# Patient Record
Sex: Male | Born: 1960 | Race: Black or African American | Hispanic: No | Marital: Single | Smoking: Current every day smoker
Health system: Southern US, Community
[De-identification: ages and names within clinical notes are randomized; demographics above are authoritative.]

## PROBLEM LIST (undated history)

## (undated) DIAGNOSIS — Z7189 Other specified counseling: Secondary | ICD-10-CM

## (undated) DIAGNOSIS — M5126 Other intervertebral disc displacement, lumbar region: Secondary | ICD-10-CM

## (undated) DIAGNOSIS — M549 Dorsalgia, unspecified: Secondary | ICD-10-CM

## (undated) DIAGNOSIS — E785 Hyperlipidemia, unspecified: Secondary | ICD-10-CM

## (undated) DIAGNOSIS — E119 Type 2 diabetes mellitus without complications: Secondary | ICD-10-CM

## (undated) DIAGNOSIS — I10 Essential (primary) hypertension: Secondary | ICD-10-CM

## (undated) DIAGNOSIS — IMO0001 Reserved for inherently not codable concepts without codable children: Secondary | ICD-10-CM

## (undated) DIAGNOSIS — Z794 Long term (current) use of insulin: Secondary | ICD-10-CM

## (undated) DIAGNOSIS — G8929 Other chronic pain: Secondary | ICD-10-CM

## (undated) DIAGNOSIS — I420 Dilated cardiomyopathy: Secondary | ICD-10-CM

## (undated) HISTORY — PX: BACK SURGERY: SHX140

## (undated) HISTORY — PX: APPENDECTOMY: SHX54

## (undated) HISTORY — PX: ABDOMINAL SURGERY: SHX537

## (undated) HISTORY — DX: Dilated cardiomyopathy: I42.0

## (undated) SURGERY — Surgical Case
Anesthesia: *Unknown

---

## 2000-06-08 ENCOUNTER — Emergency Department (HOSPITAL_COMMUNITY): Admission: EM | Admit: 2000-06-08 | Discharge: 2000-06-08 | Payer: Self-pay | Admitting: Emergency Medicine

## 2000-12-12 ENCOUNTER — Encounter: Payer: Self-pay | Admitting: Emergency Medicine

## 2000-12-12 ENCOUNTER — Emergency Department (HOSPITAL_COMMUNITY): Admission: EM | Admit: 2000-12-12 | Discharge: 2000-12-12 | Payer: Self-pay | Admitting: Emergency Medicine

## 2001-05-26 ENCOUNTER — Emergency Department (HOSPITAL_COMMUNITY): Admission: EM | Admit: 2001-05-26 | Discharge: 2001-05-26 | Payer: Self-pay | Admitting: Emergency Medicine

## 2001-05-27 ENCOUNTER — Emergency Department (HOSPITAL_COMMUNITY): Admission: EM | Admit: 2001-05-27 | Discharge: 2001-05-27 | Payer: Self-pay | Admitting: Emergency Medicine

## 2001-05-27 ENCOUNTER — Encounter: Payer: Self-pay | Admitting: Emergency Medicine

## 2001-05-29 ENCOUNTER — Emergency Department (HOSPITAL_COMMUNITY): Admission: EM | Admit: 2001-05-29 | Discharge: 2001-05-29 | Payer: Self-pay | Admitting: Emergency Medicine

## 2001-05-29 ENCOUNTER — Encounter: Payer: Self-pay | Admitting: Emergency Medicine

## 2001-09-13 ENCOUNTER — Emergency Department (HOSPITAL_COMMUNITY): Admission: EM | Admit: 2001-09-13 | Discharge: 2001-09-13 | Payer: Self-pay | Admitting: Emergency Medicine

## 2002-08-17 ENCOUNTER — Emergency Department (HOSPITAL_COMMUNITY): Admission: EM | Admit: 2002-08-17 | Discharge: 2002-08-17 | Payer: Self-pay | Admitting: Emergency Medicine

## 2002-08-17 ENCOUNTER — Encounter: Payer: Self-pay | Admitting: Emergency Medicine

## 2002-10-16 ENCOUNTER — Emergency Department (HOSPITAL_COMMUNITY): Admission: EM | Admit: 2002-10-16 | Discharge: 2002-10-16 | Payer: Self-pay

## 2002-11-18 ENCOUNTER — Encounter: Payer: Self-pay | Admitting: Internal Medicine

## 2002-11-18 ENCOUNTER — Ambulatory Visit (HOSPITAL_COMMUNITY): Admission: RE | Admit: 2002-11-18 | Discharge: 2002-11-18 | Payer: Self-pay | Admitting: Internal Medicine

## 2003-12-28 ENCOUNTER — Inpatient Hospital Stay (HOSPITAL_COMMUNITY): Admission: EM | Admit: 2003-12-28 | Discharge: 2004-01-04 | Payer: Self-pay | Admitting: Emergency Medicine

## 2003-12-28 ENCOUNTER — Encounter (INDEPENDENT_AMBULATORY_CARE_PROVIDER_SITE_OTHER): Payer: Self-pay | Admitting: *Deleted

## 2005-05-21 ENCOUNTER — Emergency Department (HOSPITAL_COMMUNITY): Admission: EM | Admit: 2005-05-21 | Discharge: 2005-05-21 | Payer: Self-pay | Admitting: *Deleted

## 2009-01-05 ENCOUNTER — Emergency Department (HOSPITAL_COMMUNITY): Admission: EM | Admit: 2009-01-05 | Discharge: 2009-01-05 | Payer: Self-pay | Admitting: Emergency Medicine

## 2011-02-07 NOTE — Consult Note (Signed)
[place]  Patient:    Ryan Medina, Ryan Medina Visit Number: [id number] MRN: [id number]          Service Type: EMS Location: [place] Attending Physician:  Lorre Nick Dictated by:   Gloris Manchester. Lazarus Salines, M.D. Proc. Date: 05/29/01 Admit Date:  05/29/2001 Discharge Date: 05/29/2001   CC:         Lorre Nick, M.D.   Consultation Report  CHIEF COMPLAINT:  Right-sided sore throat.  HISTORY OF PRESENT ILLNESS:  A 50 year old black male had an episode of what sounds like tonsillitis earlier this summer.  Beginning four days ago, he again developed a sore throat which was present for approximately 24 hours. It seemed to slack off and then come on more forcefully and localized to the right side.  No actual trismus.  Swallowing is tender but no breathing difficulty.  No documented fever.  He is not diabetic, HIV positive, or otherwise immune compromised.  Outside of the episode earlier this summer, he has not had tonsillitis to his recollection nor strep throat.  He does not carry a history of tonsillar hypertrophy.  He has a little bit of ear pain, a little bit of tenderness in his upper neck at the present time.  He was evaluated here in the emergency room today with negative findings on a lateral soft tissue neck, Vicodin for discomfort, and no antibiotics given.  He comes back in two days later.  On evaluation with the emergency room physician, CT scan showed a small, round, lucent area in the inferior aspect of the right tonsil consistent with a small abscess.  PHYSICAL EXAMINATION:  GENERAL:  This is a stocky adult black male in slight distress.  HEENT:  He does not have a hot potato voice.  He does have strong fetor oris. No visible neck swelling.  He is mildly tender in the right jugulodigastric region without discrete adenopathy.  He is breathing comfortably.  Both ear canals are clear with aerated drums and normal configuration.  Cranial  nerves intact.  Anterior nose clear.  Oral cavity most with teeth in good repair.  He has a very bulky, muscular tongue.  He has a slightly long soft palate and fleshy pharynx.  He has embedded tonsils with visible cryptic debris on the left side.  There was slight increased erythema and swelling of right tonsil but no  deviation of the uvula, no soft palate edema, and no active drainage. I did not examine nasopharynx or hypopharynx.  IMPRESSION:  Tonsillitis with probable peritonsillar cellulitis and possible small abscess.  Given the new use of CT scan as a diagnostic modality, it is not clear if small abscesses like this may correspond with cases where clinically abscess was in evidence and responded to medical management.  PLAN:  I have discussed this with him.  I think, since he has had no therapy thus far, I would like to try him on antibiotics.  Will have him on penicillin VK 1 g q.i.d. for 2 days followed by 500 mg q.i.d. for the remainder of 10 days.  I gave him some additional Vicodin for discomfort.  I explained to him that if there is a small abscess there, it may get worse such that we will have to see him once again.  Given the location of the tonsil and the configuration of his tongue, I think he would require intraoperative drainage if necessary.  We will hope to avoid this.  If the patient is doing nicely,  I will see him back in six weeks to make sure that tonsillar hypertrophy has settled down and everything else is clear.  If he is having additional problems, I will be glad to see him in the immediate future.  He understands and agrees. Dictated by:   Gloris Manchester. Lazarus Salines, M.D. Attending Physician:  Lorre Nick DD:  05/29/01 TD:  05/30/01 Job: [id number]

## 2011-02-07 NOTE — Consult Note (Signed)
NAME:  Ryan Medina, Ryan Medina                           ACCOUNT NO.:  [id number]   MEDICAL RECORD NO.:  [id number]                   PATIENT TYPE:  INP   LOCATION:  2106                                 FACILITY:  [facility]   PHYSICIAN:  Velora Heckler, M.D.                DATE OF BIRTH:  1961-08-07   DATE OF CONSULTATION:  12/28/2003  DATE OF DISCHARGE:                                   CONSULTATION   REFERRING PHYSICIANS:  1. Dante Gang, M.D.  2. Madaline Guthrie, M.D.   REASON FOR CONSULTATION:  Abdominal pain, gastrointestinal bleeding.   HISTORY OF PRESENT ILLNESS:  The patient is a 50 year old black male who  presents to the emergency department of [facility] by EMS  with 24-hour history of abdominal pain and fever.  The patient notes onset  of diffuse abdominal pain on December 27, 2003.  He was transported by EMS to  the emergency department.  The patient was seen by medical teaching service.  He was noted to have an elevated white blood cell count of 16,000.  Hemoglobin was low at 10.1.   Physical exam demonstrated abdominal tenderness.  General surgery is  consulted for evaluation.   The patient notes relatively sudden onset of abdominal pain.  He notes no  precipitating events.  He has had no prior episodes of pain of this type.  He notes normal bowel function.  He denies diarrhea or constipation.  He  denies  nausea or vomiting.  He does note some sweats.   PAST MEDICAL HISTORY:  1. Status post lumbar surgery by Dr. Ronnell Guadalajara in the past.  2. History of alcohol abuse.   MEDICATIONS:  None.   ALLERGIES:  None known.   SOCIAL HISTORY:  The patient lives in [place].  He lives with his mother.  He drinks approximately a fifth of liquor daily.  He smokes one to two packs  of cigarettes per week.   FAMILY HISTORY:  Noncontributory.   REVIEW OF SYMPTOMS:  A 15 system reviewed and discussed with the patient  without significant other findings except as  noted above.   PHYSICAL EXAMINATION:  GENERAL:  A 50 year old well-developed, well-  nourished black male in the emergency department room 9.  VITAL SIGNS:  Temperature 101.1, blood pressure 120/72, pulse 95,  respirations 36.  O2 saturation 98% on room air.  HEENT:  Normocephalic and atraumatic.  Sclerae are slightly icteric.  Pupils  are equal and round.  Dentition is good.  Mucus membranes are moist.  Voice  is normal.  NECK:  Anterior examination of the neck shows it to be symmetric.  Thyroid  is normal without nodularity.  There is no anterior or posterior cervical  lymphadenopathy and no supraclavicular masses.  LUNGS:  Clear to auscultation bilaterally without rales, rhonchi, or  wheezes.  CARDIOVASCULAR:  A mild tachycardia without murmur.  ABDOMEN:  Firm,  tense but without distention.  There are bowel sounds on  auscultation. There is diffuse abdominal tenderness to palpation and  percussion.  There is no palpable mass.  There is prominent voluntary  guarding.  There are no surgical wounds.  There is no sign of hernia.  EXTREMITIES:  Nontender without edema.  NEUROLOGICAL:  The patient is rousable to voice, although somewhat  somnolent.  No focal neurological deficits are identified.   LABORATORY DATA:  Abdominal x-ray shows findings of constipation. There is  no free air.  There is no sign of obstruction.  CT scan of the abdomen is  pending.   White count 16,000, hemoglobin 10.1, platelet count 384,000.  Electrolytes  are normal.  Liver function tests are normal.  Lipase is normal.  Coagulation studies are normal with an INR of 1.2.   IMPRESSION:  A 50 year old black male with history of alcohol abuse and now  sudden onset of abdominal pain.   PLAN:  1. Agree with plans for CT scan of abdomen now.  2. Agree with plans for gastroenterology consultation.  3. Will follow closely with you.                                               Velora Heckler, M.D.    TMG/MEDQ   D:  12/28/2003  T:  12/28/2003  Job:  [id number]   cc:   Madaline Guthrie, M.D.  [address]  [place] [address]  Fax: [phone]   Dante Gang, M.D.  Int. Med - Resident - [facility]  [place]  [place] [address]  Fax: [phone]

## 2011-02-07 NOTE — Discharge Summary (Signed)
NAME:  Ryan Medina, Ryan Medina                           ACCOUNT NO.:  [id number]   MEDICAL RECORD NO.:  [id number]                   PATIENT TYPE:  INP   LOCATION:  5736                                 FACILITY:  [facility]   PHYSICIAN:  Madaline Guthrie, M.D.                 DATE OF BIRTH:  October 21, 1960   DATE OF ADMISSION:  12/28/2003  DATE OF DISCHARGE:  01/04/2004                                 DISCHARGE SUMMARY   DISCHARGE DIAGNOSES:  1. 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  2. Status post aspiration event during induction of anesthesia.  3. History of ETOH abuse.   DISCHARGE MEDICATIONS:  Vicodin, per Dr. Gerrit Friends, p.r.n. pain.   DISCHARGE INSTRUCTIONS:  1. No strenuous activity.  2. No lifting of greater than 10 pounds for two weeks.  3. Soft diet.  4. The patient may shower.  5. May allow Steri-Strips to wear off on their own.  6. Call Dr. Ardine Eng office for temperature greater than 101, for redness,     drainage, or swelling at the incision site.   FOLLOWUP APPOINTMENT:  Dr. Gerrit Friends, call [phone] for appointment within the  next two weeks.   PROCEDURES PERFORMED:  Patient underwent a exploratory laparotomy and  closure of a pyloric channel ulcer with a Cheree Ditto patch on December 28, 2003.  This was performed by Dr. Velora Heckler.   CONSULTATIONS:  Velora Heckler, M.D., general surgery.   BRIEF ADMISSION HISTORY AND PHYSICAL:  1. This is a 50 year old male with a history of heavy alcohol use who     reported to the ED with a one day history of abdominal pain of sudden     onset.  CT scan showed probable duodenal perforation with extra     extravasation of contrast media.  The patient was taken to the OR and     underwent an exploratory lap with closure of the pyloric channel ulcer     with Cheree Ditto patch as noted above.  During the induction of anesthesia,     the patient also experienced an aspiration event.  The patient was placed     on IV antibiotics including  Zosyn, amoxicillin, IV Protonix.  The patient     was initially maintained in the ICU, after surgery and then later     transferred to stepdown unit prior to going to the floor.  The patient     was examined daily and evaluated for bowel sounds.  He did develop a     postoperative ileus.  When it stabilized, the patient was started on a     liquid diet and the diet was advanced as tolerated to solids.  The     patient was discharged tolerating a solid diet and stable.  On discharge,     the patient's hemoglobin was 8.4.  2. Aspiration event.  During induction of anesthesia, the patient was  maintained on the ventilator post-op, serial chest x-rays were taken.     The patient was extubated, on December 29, 2003, and tolerated extubation     well.  He experienced no other respiratory complications, and his     respiratory status was stable upon discharge.  3. ETOH abuse.  The patient was started on thiamine and folate.  The patient     was given counseling regarding     ETOH abuse and how it related to his admission to the hospital.  The     patient was monitored for alcohol withdrawal.  He suffered no signs or     symptoms of withdrawal during his stay and was discharged home in stable     condition.      Donald Pore, MD                          Madaline Guthrie, M.D.    HP/MEDQ  D:  03/14/2004  T:  03/16/2004  Job:  [id number]   cc:   Velora Heckler, M.D.  [address] [place]  [place] [address]  Fax: [phone]

## 2011-02-07 NOTE — Op Note (Signed)
NAME:  Ryan Medina, Ryan Medina                           ACCOUNT NO.:  [id number]   MEDICAL RECORD NO.:  [id number]                   PATIENT TYPE:  INP   LOCATION:  2106                                 FACILITY:  [facility]   PHYSICIAN:  Velora Heckler, M.D.                DATE OF BIRTH:  1960-11-24   DATE OF PROCEDURE:  12/28/2003  DATE OF DISCHARGE:                                 OPERATIVE REPORT   PREOPERATIVE DIAGNOSIS:  Perforated ulcer.   POSTOPERATIVE DIAGNOSIS:  Perforated pyloric channel ulcer with diffuse  peritonitis.   PROCEDURE:  1. Exploratory laparotomy.  2. Closure pyloric channel ulcer with Cheree Ditto patch.   SURGEON:  Velora Heckler, M.D.   ASSISTANT:  Ollen Gross. Carolynne Edouard, M.D.   ANESTHESIA:  General per Dr. Edrick Oh BLOOD LOSS:  Minimal.   PREPARATION:  Betadine.   COMPLICATIONS:  Aspiration event with induction of anesthesia.   INDICATIONS FOR PROCEDURE:  The patient is a 50 year old black male who  presents to the emergency room  with a 24 hour history of abdominal pain.  The patient was seen by the medical teaching service.  General surgery was  consulted.  CT scan was obtained which showed perforated duodenal ulcer with  free air and peritoneal fluid.  The patient was brought urgently to the  operating room for exploration.   DESCRIPTION OF PROCEDURE:  The procedure was done in OR 16 at [facility].  The patient was brought to the operating room and placed  in a supine position on the operating table.  During induction of  anesthesia, the patient had an aspiration event with emesis of gastric  contents.  The patient was successfully intubated and suctioned.  Bilious  colored fluid was noted coming from the endotracheal tube during the  procedure.  This is recorded in the anesthesia notes.  After assuring that  adequate airway and induction of anesthesia had been achieved, the patient  was prepped and draped in the usual strict aseptic  fashion.  After  ascertaining that an adequate level of anesthesia had been obtained, a  midline abdominal incision was made with a #10 blade.  Dissection was  carried down through the subcutaneous tissues.  The fascia was incised in  the midline and the peritoneal cavity was entered cautiously.  2.5 liters of  bilious stained fluid was aspirated from the peritoneal cavity.  There were  signs of diffuse peritonitis with a fibrinous exudate on the bowel wall and  peritoneal surfaces.  Exploration reveals an approximately 8 mm round  perforation in the pyloric channel anteriorly.  Bilious fluid is coming from  the opening.  The defect is closed using interrupted 2-0 silk sutures with a  segment of omentum forming a Graham patch.  This provides excellent closure  of the defect.  The abdomen is then copiously irrigated with several liters  of  warm saline which is evacuated.  Nasogastric tube is properly positioned  within the stomach.  The midline wound is closed with  interrupted #1 Novafil sutures.  The subcutaneous tissues were irrigated and  the skin was closed with stainless steel staples.  Sterile dressings were  applied.  The patient was transported from the operating room to the  intensive care unit intubated and sedated.                                               Velora Heckler, M.D.    TMG/MEDQ  D:  12/28/2003  T:  12/28/2003  Job:  [id number]   cc:   Madaline Guthrie, M.D.  [address]  [place] [address]  Fax: [phone]   Charlaine Dalton. Sherene Sires, M.D. [facility] -

## 2011-10-31 ENCOUNTER — Ambulatory Visit (HOSPITAL_COMMUNITY)
Admission: RE | Admit: 2011-10-31 | Discharge: 2011-10-31 | Disposition: A | Discharge status: Home / Self Care | Payer: Self-pay | Source: Ambulatory Visit | Attending: Emergency Medicine | Admitting: Emergency Medicine

## 2011-10-31 ENCOUNTER — Other Ambulatory Visit (HOSPITAL_COMMUNITY): Payer: Self-pay

## 2011-10-31 ENCOUNTER — Encounter (HOSPITAL_COMMUNITY): Payer: Self-pay

## 2011-10-31 ENCOUNTER — Emergency Department (INDEPENDENT_AMBULATORY_CARE_PROVIDER_SITE_OTHER)
Admission: EM | Admit: 2011-10-31 | Discharge: 2011-10-31 | Disposition: A | Discharge status: Home / Self Care | Payer: Self-pay | Source: Home / Self Care | Attending: Emergency Medicine | Admitting: Emergency Medicine

## 2011-10-31 ENCOUNTER — Ambulatory Visit (HOSPITAL_COMMUNITY): Admit: 2011-10-31 | Payer: Self-pay

## 2011-10-31 DIAGNOSIS — M79609 Pain in unspecified limb: Secondary | ICD-10-CM

## 2011-10-31 DIAGNOSIS — R52 Pain, unspecified: Secondary | ICD-10-CM

## 2011-10-31 DIAGNOSIS — M7989 Other specified soft tissue disorders: Secondary | ICD-10-CM

## 2011-10-31 DIAGNOSIS — I809 Phlebitis and thrombophlebitis of unspecified site: Secondary | ICD-10-CM

## 2011-10-31 DIAGNOSIS — R609 Edema, unspecified: Secondary | ICD-10-CM

## 2011-10-31 HISTORY — DX: Essential (primary) hypertension: I10

## 2011-10-31 HISTORY — DX: Other intervertebral disc displacement, lumbar region: M51.26

## 2011-10-31 MED ORDER — IBUPROFEN 800 MG PO TABS: 800.0000 mg | ORAL_TABLET | Freq: Three times a day (TID) | ORAL | Status: AC | PRN

## 2011-10-31 NOTE — ED Notes (Addendum)
C/o pain and swelling in lower left leg for 2 week; leg tight, warm to touch , [facility] nurse told him to come get checked . Pt denies injury

## 2011-10-31 NOTE — ED Provider Notes (Addendum)
History     CSN: [id number]  Arrival date & time 10/31/11  0913   First MD Initiated Contact with Patient 10/31/11 1004      Chief Complaint  Patient presents with  . Leg Pain    (Consider location/radiation/quality/duration/timing/severity/associated sxs/prior treatment) HPI Comments: Patient reports acute medial left lower extremity pain, redness, swelling starting 2 weeks ago. Does not recall any recent trauma to the area. Has some medial redness, tenderness, along great saphenous vein. No rash, bruising. Patient denies chest pain, shortness of breath, tachycardia, presyncope, syncope. No history of cancer, prolonged immobilization, surgery in the past 4 weeks, history of DVT or PE, exogenous hormones. No history of hypercoagulability. Patient hasn't tried anything for this.  ROS as noted in HPI. All other ROS negative.   Patient is a 51 y.o. male presenting with leg pain. The history is provided by the patient. No language interpreter was used.  Leg Pain  The incident occurred at home. There was no injury mechanism. The pain is present in the left leg. The pain has been constant since onset. Pertinent negatives include no numbness, no inability to bear weight, no loss of motion, no muscle weakness, no loss of sensation and no tingling. He has tried nothing for the symptoms.    Past Medical History  Diagnosis Date  . Hypertension   . Ruptured lumbar disc     Past Surgical History  Procedure Date  . Back surgery   . Abdominal surgery     History reviewed. No pertinent family history.  History  Substance Use Topics  . Smoking status: Current Everyday Smoker  . Smokeless tobacco: Not on file  . Alcohol Use: No      Review of Systems  Neurological: Negative for tingling and numbness.    Allergies  Review of patient's allergies indicates no known allergies.  Home Medications   Current Outpatient Rx  Name Route Sig Dispense Refill  . HYDROCHLOROTHIAZIDE 25 MG PO  TABS Oral Take 25 mg by mouth daily.      BP 147/96  Pulse 68  Temp(Src) 98.5 F (36.9 C) (Oral)  SpO2 100%  Physical Exam  Nursing note and vitals reviewed. Constitutional: He is oriented to person, place, and time. He appears well-developed and well-nourished.  HENT:  Head: Normocephalic and atraumatic.  Eyes: Conjunctivae and EOM are normal.  Neck: Normal range of motion.  Cardiovascular: Regular rhythm.   Pulmonary/Chest: Effort normal. No respiratory distress.  Abdominal: He exhibits no distension.  Musculoskeletal: Normal range of motion.       Legs:      Tenderness, erythema, palpable cord along great saphenous vein of left lower extremity. Negative Homans sign. No palpable cord behind half. 1+ edema distally. DP 2+. Sensation intact distal to swelling. Patient able to move all toes actively. No tenderness along medial upper thigh. Foot, ankle, knee joint within normal limits  Neurological: He is alert and oriented to person, place, and time.  Skin: Skin is warm and dry.  Psychiatric: He has a normal mood and affect. His behavior is normal.    ED Course  Procedures (including critical care time)  Labs Reviewed - No data to display No results found.   1. Acute superficial venous thrombosis of lower extremity     duplex results with venous tech. No evidence of DVT bilaterarlly. No SVT LLE. (+) varicose veins LLE.   MDM  No previous records are available. Patient with apparent superficial venous thrombosis left lower extremity. Patient satting  100% on room air, is not tachycardic. Denies any chest pain, or shortness of breath. I have arranged a venous duplex ultrasound of both lower extremities today at 2:30. Patient understands that he will go and register 145, and have the study done. If he has a DVT, he will go to the ER. If he has a superficial venous thrombosis, he will come back up here. Discussed this with patient extensively. Patient agrees with plan.  Luiz Blare, MD 10/31/11 1110  Pt with no SVT, DVT. Will tx as superficial thorombophlebitis with TED hose, warm soaks, and NSAIDs. Pt returned to [facility]- RN discussed results and plan with pt. Will have him return in 1 week to 10 days for re-evaluation.   Luiz Blare, MD 10/31/11 2156

## 2011-10-31 NOTE — Progress Notes (Signed)
*  PRELIMINARY RESULTS* Vascular Ultrasound Lower Extremity Venous Duplex has been completed.  Preliminary findings: Bilateral:  No evidence of DVT, superficial thrombosis, or Baker's Cyst.    Farrel Demark RDMS 10/31/2011, 3:31 PM

## 2013-11-27 ENCOUNTER — Emergency Department (HOSPITAL_COMMUNITY)
Admission: EM | Admit: 2013-11-27 | Discharge: 2013-11-27 | Disposition: A | Discharge status: Home / Self Care | Payer: No Typology Code available for payment source | Attending: Emergency Medicine | Admitting: Emergency Medicine

## 2013-11-27 ENCOUNTER — Encounter (HOSPITAL_COMMUNITY): Payer: Self-pay | Admitting: Emergency Medicine

## 2013-11-27 DIAGNOSIS — T464X5A Adverse effect of angiotensin-converting-enzyme inhibitors, initial encounter: Secondary | ICD-10-CM

## 2013-11-27 DIAGNOSIS — Z79899 Other long term (current) drug therapy: Secondary | ICD-10-CM | POA: Insufficient documentation

## 2013-11-27 DIAGNOSIS — I10 Essential (primary) hypertension: Secondary | ICD-10-CM | POA: Insufficient documentation

## 2013-11-27 DIAGNOSIS — T4995XA Adverse effect of unspecified topical agent, initial encounter: Secondary | ICD-10-CM | POA: Insufficient documentation

## 2013-11-27 DIAGNOSIS — F17200 Nicotine dependence, unspecified, uncomplicated: Secondary | ICD-10-CM | POA: Insufficient documentation

## 2013-11-27 DIAGNOSIS — Z8739 Personal history of other diseases of the musculoskeletal system and connective tissue: Secondary | ICD-10-CM | POA: Insufficient documentation

## 2013-11-27 DIAGNOSIS — T783XXA Angioneurotic edema, initial encounter: Secondary | ICD-10-CM | POA: Insufficient documentation

## 2013-11-27 MED ORDER — METHYLPREDNISOLONE SODIUM SUCC 125 MG IJ SOLR
125.0000 mg | Freq: Once | INTRAMUSCULAR | Status: AC
  Administered 2013-11-27: 125 mg via INTRAVENOUS
  Filled 2013-11-27: qty 2

## 2013-11-27 MED ORDER — PREDNISONE 20 MG PO TABS: 40.0000 mg | ORAL_TABLET | Freq: Every day | ORAL | Status: DC

## 2013-11-27 MED ORDER — FAMOTIDINE IN NACL 20-0.9 MG/50ML-% IV SOLN
20.0000 mg | Freq: Once | INTRAVENOUS | Status: AC
  Administered 2013-11-27: 20 mg via INTRAVENOUS
  Filled 2013-11-27: qty 50

## 2013-11-27 MED ORDER — DIPHENHYDRAMINE HCL 50 MG/ML IJ SOLN
25.0000 mg | Freq: Once | INTRAMUSCULAR | Status: AC
  Administered 2013-11-27: 25 mg via INTRAVENOUS
  Filled 2013-11-27: qty 1

## 2013-11-27 NOTE — ED Provider Notes (Signed)
Medical screening examination/treatment/procedure(s) were performed by non-physician practitioner and as supervising physician I was immediately available for consultation/collaboration.   EKG Interpretation None        Sam Overbeck S Kharon Hixon, MD 11/27/13 2129 

## 2013-11-27 NOTE — ED Notes (Signed)
PT reports RT side of face was swollen when he woke up this AM. No dental pain . No rashes observed .

## 2013-11-27 NOTE — ED Provider Notes (Signed)
CSN: [id number]     Arrival date & time 11/27/13  4174 History  This chart was scribed for Ryan Chou, PA working with Blanchard Kelch, MD by Roxan Diesel, ED Scribe. This patient was seen in room TR08C/TR08C and the patient's care was started at 9:51 AM.   Chief Complaint  Patient presents with  . Facial Swelling    The history is provided by the patient. No language interpreter was used.    HPI Comments: Ryan Medina is a 53 y.o. male who presents to the Emergency Department complaining of right-sided facial swelling that he noticed on waking this morning.  Pt states he was feeling fine when he went to bed last night but he woke up this morning with swelling to his right cheek and his right upper and lower lips.  He states he otherwise feels well and denies dental pain, SOB, throat swelling, fevers, rash, or any other associated symptoms.  He denies injury.  He takes lisinopril for his HTN and started taking it 2 months ago.   Past Medical History  Diagnosis Date  . Hypertension   . Ruptured lumbar disc     Past Surgical History  Procedure Laterality Date  . Back surgery    . Abdominal surgery      History reviewed. No pertinent family history.   History  Substance Use Topics  . Smoking status: Current Some Day Smoker  . Smokeless tobacco: Not on file  . Alcohol Use: No     Review of Systems  HENT: Positive for facial swelling.   All other systems reviewed and are negative.      Allergies  Review of patient's allergies indicates no known allergies.  Home Medications   Current Outpatient Rx  Name  Route  Sig  Dispense  Refill  . hydrochlorothiazide (HYDRODIURIL) 25 MG tablet   Oral   Take 25 mg by mouth daily.          BP 140/92  Pulse 79  Temp(Src) 98.2 F (36.8 C) (Oral)  Resp 20  Wt 202 lb 1 oz (91.655 kg)  SpO2 100%  Physical Exam  Nursing note and vitals reviewed. Constitutional: He is oriented to person, place, and time. He  appears well-developed and well-nourished. No distress.  HENT:  Head: Normocephalic and atraumatic.  Right cheek and right side of upper and lower lip edematous, nontender to palpation.  No fluctuance noted.  No open wound.  Good dentition.  Eyes: EOM are normal.  Neck: Neck supple. No tracheal deviation present.  Cardiovascular: Normal rate and regular rhythm.   No murmur heard. Pulmonary/Chest: Effort normal and breath sounds normal. No respiratory distress. He has no wheezes. He has no rales.  Musculoskeletal: Normal range of motion.  Neurological: He is alert and oriented to person, place, and time.  Skin: Skin is warm and dry.  Psychiatric: He has a normal mood and affect. His behavior is normal.    ED Course  Procedures (including critical care time)  DIAGNOSTIC STUDIES: Oxygen Saturation is 100% on room air, normal by my interpretation.    COORDINATION OF CARE: 9:54 AM-Discussed treatment plan which includes discontinuing lisinopril, prednisone, and switching to a different BP medication.  Advised staying in the ED for a short observation to ensure pt's condition does not worsen.  Pt agreed with plan.    Labs Review Labs Reviewed - No data to display  Imaging Review No results found.   EKG Interpretation None  MDM   Final diagnoses:  ACE inhibitor-aggravated angioedema    12:07 PM Patient given solumedrol, benadryl and pepcid in the ED. Vitals stable and patient afebrile. No airway compromise. Patient will discontinue lisinopril and have prednisone prescription. Patient advised to follow up with PCP and return to the ED with worsening or concerning symptoms.     I personally performed the services described in this documentation, which was scribed in my presence. The recorded information has been reviewed and is accurate.    Ryan Medina, [place] 11/27/13 1208

## 2013-11-27 NOTE — Discharge Instructions (Signed)
Discontinue your lisinopril prescription. Follow up with your doctor for further evaluation and treatment. Refer to attached documents for more information. Return to the ED with worsening or concerning symptoms.

## 2013-12-14 ENCOUNTER — Encounter (HOSPITAL_COMMUNITY): Payer: Self-pay | Admitting: Emergency Medicine

## 2013-12-14 ENCOUNTER — Emergency Department (HOSPITAL_COMMUNITY)
Admission: EM | Admit: 2013-12-14 | Discharge: 2013-12-14 | Disposition: A | Discharge status: Home / Self Care | Payer: No Typology Code available for payment source | Attending: Emergency Medicine | Admitting: Emergency Medicine

## 2013-12-14 DIAGNOSIS — T783XXA Angioneurotic edema, initial encounter: Secondary | ICD-10-CM | POA: Insufficient documentation

## 2013-12-14 DIAGNOSIS — T464X5A Adverse effect of angiotensin-converting-enzyme inhibitors, initial encounter: Secondary | ICD-10-CM

## 2013-12-14 DIAGNOSIS — Z8739 Personal history of other diseases of the musculoskeletal system and connective tissue: Secondary | ICD-10-CM | POA: Insufficient documentation

## 2013-12-14 DIAGNOSIS — T465X5A Adverse effect of other antihypertensive drugs, initial encounter: Secondary | ICD-10-CM | POA: Insufficient documentation

## 2013-12-14 DIAGNOSIS — F17200 Nicotine dependence, unspecified, uncomplicated: Secondary | ICD-10-CM | POA: Insufficient documentation

## 2013-12-14 DIAGNOSIS — Z79899 Other long term (current) drug therapy: Secondary | ICD-10-CM | POA: Insufficient documentation

## 2013-12-14 DIAGNOSIS — I10 Essential (primary) hypertension: Secondary | ICD-10-CM | POA: Insufficient documentation

## 2013-12-14 MED ORDER — DIPHENHYDRAMINE HCL 25 MG PO TABS: 25.0000 mg | ORAL_TABLET | Freq: Four times a day (QID) | ORAL | Status: DC

## 2013-12-14 MED ORDER — PREDNISONE 20 MG PO TABS: ORAL_TABLET | ORAL | Status: DC

## 2013-12-14 MED ORDER — DIPHENHYDRAMINE HCL 50 MG/ML IJ SOLN
25.0000 mg | Freq: Once | INTRAMUSCULAR | Status: AC
  Administered 2013-12-14: 25 mg via INTRAVENOUS
  Filled 2013-12-14: qty 1

## 2013-12-14 MED ORDER — METHYLPREDNISOLONE SODIUM SUCC 125 MG IJ SOLR
125.0000 mg | Freq: Once | INTRAMUSCULAR | Status: AC
  Administered 2013-12-14: 125 mg via INTRAVENOUS
  Filled 2013-12-14: qty 2

## 2013-12-14 MED ORDER — DIPHENHYDRAMINE HCL 25 MG PO CAPS
25.0000 mg | ORAL_CAPSULE | Freq: Once | ORAL | Status: AC
  Administered 2013-12-14: 25 mg via ORAL
  Filled 2013-12-14: qty 1

## 2013-12-14 MED ORDER — FAMOTIDINE IN NACL 20-0.9 MG/50ML-% IV SOLN
20.0000 mg | Freq: Once | INTRAVENOUS | Status: AC
  Administered 2013-12-14: 20 mg via INTRAVENOUS
  Filled 2013-12-14: qty 50

## 2013-12-14 MED ORDER — HYDROCHLOROTHIAZIDE 25 MG PO TABS: 25.0000 mg | ORAL_TABLET | Freq: Every day | ORAL | Status: DC

## 2013-12-14 MED ORDER — FAMOTIDINE 20 MG PO TABS
20.0000 mg | ORAL_TABLET | Freq: Once | ORAL | Status: AC
  Administered 2013-12-14: 20 mg via ORAL
  Filled 2013-12-14: qty 1

## 2013-12-14 MED ORDER — FAMOTIDINE 20 MG PO TABS: 20.0000 mg | ORAL_TABLET | Freq: Two times a day (BID) | ORAL | Status: DC

## 2013-12-14 NOTE — ED Notes (Signed)
Pt c/o left sided facial swelling at mouth that pt thinks could be from taking Hctz; no distress noted and pt denies pain or dental pain

## 2013-12-14 NOTE — Discharge Instructions (Signed)
FILL THE PRESCRIPTIONS GIVEN AS SOON AS YOU ARE ABLE. DO NOT TAKE ANY MORE OF THE LISINOPRIL. RETURN TO THE EMERGENCY DEPARTMENT IF SYMPTOMS WORSEN.  Angioedema Angioedema is a sudden swelling of tissues, often of the skin. It can occur on the face or genitals or in the abdomen or other body parts. The swelling usually develops over a short period and gets better in 24 to 48 hours. It often begins during the night and is found when the person wakes up. The person may also get red, itchy patches of skin (hives). Angioedema can be dangerous if it involves swelling of the air passages.  Depending on the cause, episodes of angioedema may only happen once, come back in unpredictable patterns, or repeat for several years and then gradually fade away.  CAUSES  Angioedema can be caused by an allergic reaction to various triggers. It can also result from nonallergic causes, including reactions to drugs, immune system disorders, viral infections, or an abnormal gene that is passed to you from your parents (hereditary). For some people with angioedema, the cause is unknown.  Some things that can trigger angioedema include:   Foods.   Medicines, such as ACE inhibitors, ARBs, nonsteroidal anti-inflammatory agents, or estrogen.   Latex.   Animal saliva.   Insect stings.   Dyes used in X-rays.   Mild injury.   Dental work.  Surgery.  Stress.   Sudden changes in temperature.   Exercise. SIGNS AND SYMPTOMS   Swelling of the skin.  Hives. If these are present, there is also intense itching.  Redness in the affected area.   Pain in the affected area.  Swollen lips or tongue.  Breathing problems. This may happen if the air passages swell.  Wheezing. If internal organs are involved, there may be:   Nausea.   Abdominal pain.   Vomiting.   Difficulty swallowing.   Difficulty passing urine. DIAGNOSIS   Your health care provider will examine the affected area and take  a medical and family history.  Various tests may be done to help determine the cause. Tests may include:  Allergy skin tests to see if the problem is an allergic reaction.   Blood tests to check for hereditary angioedema.   Tests to check for underlying diseases that could cause the condition.   A review of your medicines, including over the counter medicines, may be done. TREATMENT  Treatment will depend on the cause of the angioedema. Possible treatments include:   Removal of anything that triggered the condition (such as stopping certain medicines).   Medicines to treat symptoms or prevent attacks. Medicines given may include:   Antihistamines.   Epinephrine injection.   Steroids.   Hospitalization may be required for severe attacks. If the air passages are affected, it can be an emergency. Tubes may need to be placed to keep the airway open. HOME CARE INSTRUCTIONS   Only take over-the-counter or prescription medicines as directed by your health care provider.  If you were given medicines for emergency allergy treatment, always carry them with you.  Wear a medical bracelet as directed by your health care provider.   Avoid known triggers. SEEK MEDICAL CARE IF:   You have repeat attacks of angioedema.   Your attacks are more frequent or more severe despite preventive measures.   You have hereditary angioedema and are considering having children. It is important to discuss the risks of passing the condition on to your children with your health care provider. SEEK  IMMEDIATE MEDICAL CARE IF:   You have severe swelling of the mouth, tongue, or lips.  You have difficulty breathing.   You have difficulty swallowing.   You faint. MAKE SURE YOU:  Understand these instructions.  Will watch your condition.  Will get help right away if you are not doing well or get worse. Document Released: 11/17/2001 Document Revised: 06/29/2013 Document Reviewed:  05/02/2013 [facility] Patient Information 2014 [place], [place].

## 2013-12-14 NOTE — ED Provider Notes (Signed)
Medical screening examination/treatment/procedure(s) were performed by non-physician practitioner and as supervising physician I was immediately available for consultation/collaboration.   EKG Interpretation None        Mervin Kung, MD 12/14/13 [phone]

## 2013-12-14 NOTE — Discharge Instructions (Signed)
Take prednisone, pepcid and benadryl as directed. DO NOT TAKE LISINOPRIL. Take hydrochlorothiazide as prescribed. Follow up with the wellness clinic.  Angioedema Angioedema is a sudden swelling of tissues, often of the skin. It can occur on the face or genitals or in the abdomen or other body parts. The swelling usually develops over a short period and gets better in 24 to 48 hours. It often begins during the night and is found when the person wakes up. The person may also get red, itchy patches of skin (hives). Angioedema can be dangerous if it involves swelling of the air passages.  Depending on the cause, episodes of angioedema may only happen once, come back in unpredictable patterns, or repeat for several years and then gradually fade away.  CAUSES  Angioedema can be caused by an allergic reaction to various triggers. It can also result from nonallergic causes, including reactions to drugs, immune system disorders, viral infections, or an abnormal gene that is passed to you from your parents (hereditary). For some people with angioedema, the cause is unknown.  Some things that can trigger angioedema include:   Foods.   Medicines, such as ACE inhibitors, ARBs, nonsteroidal anti-inflammatory agents, or estrogen.   Latex.   Animal saliva.   Insect stings.   Dyes used in X-rays.   Mild injury.   Dental work.  Surgery.  Stress.   Sudden changes in temperature.   Exercise. SIGNS AND SYMPTOMS   Swelling of the skin.  Hives. If these are present, there is also intense itching.  Redness in the affected area.   Pain in the affected area.  Swollen lips or tongue.  Breathing problems. This may happen if the air passages swell.  Wheezing. If internal organs are involved, there may be:   Nausea.   Abdominal pain.   Vomiting.   Difficulty swallowing.   Difficulty passing urine. DIAGNOSIS   Your health care provider will examine the affected area and  take a medical and family history.  Various tests may be done to help determine the cause. Tests may include:  Allergy skin tests to see if the problem is an allergic reaction.   Blood tests to check for hereditary angioedema.   Tests to check for underlying diseases that could cause the condition.   A review of your medicines, including over the counter medicines, may be done. TREATMENT  Treatment will depend on the cause of the angioedema. Possible treatments include:   Removal of anything that triggered the condition (such as stopping certain medicines).   Medicines to treat symptoms or prevent attacks. Medicines given may include:   Antihistamines.   Epinephrine injection.   Steroids.   Hospitalization may be required for severe attacks. If the air passages are affected, it can be an emergency. Tubes may need to be placed to keep the airway open. HOME CARE INSTRUCTIONS   Only take over-the-counter or prescription medicines as directed by your health care provider.  If you were given medicines for emergency allergy treatment, always carry them with you.  Wear a medical bracelet as directed by your health care provider.   Avoid known triggers. SEEK MEDICAL CARE IF:   You have repeat attacks of angioedema.   Your attacks are more frequent or more severe despite preventive measures.   You have hereditary angioedema and are considering having children. It is important to discuss the risks of passing the condition on to your children with your health care provider. SEEK IMMEDIATE MEDICAL CARE IF:  You have severe swelling of the mouth, tongue, or lips.  You have difficulty breathing.   You have difficulty swallowing.   You faint. MAKE SURE YOU:  Understand these instructions.  Will watch your condition.  Will get help right away if you are not doing well or get worse. Document Released: 11/17/2001 Document Revised: 06/29/2013 Document Reviewed:  05/02/2013 [facility] Patient Information 2014 [place], [place].

## 2013-12-14 NOTE — ED Provider Notes (Signed)
CSN: [id number]     Arrival date & time 12/14/13  1934 History   None    This chart was scribed for non-physician practitioner, Charlann Lange PA-C, working with Blanchard Kelch, MD by Forrestine Him, ED Scribe. This patient was seen in room TR04C/TR04C and the patient's care was started at 8:44 PM.   Chief Complaint  Patient presents with  . Allergic Reaction  . Oral Swelling   The history is provided by the patient. No language interpreter was used.    HPI Comments: Ryan Medina is a 53 y.o. male with a PMHx of HTN and ruptured lumbar disc who presents to the Emergency Department complaining of a possible allergic reaction that started last night while asleep that is progressively worsening. He reports noticeable ongoing swelling to L side of the face. He has also noted new onset upper lip swelling x 6 PM this evening after taking a dose of Linisopril.  He has not tried any OTC Pepcid or Benadryl for his symptoms. States he was seen here earlier this morning for L sided facial swelling and was told to D/C HCTZ and start a new BP medication. At this time he denies any SOB. No other concerns this visit.   Past Medical History  Diagnosis Date  . Hypertension   . Ruptured lumbar disc    Past Surgical History  Procedure Laterality Date  . Back surgery    . Abdominal surgery     No family history on file. History  Substance Use Topics  . Smoking status: Current Some Day Smoker  . Smokeless tobacco: Not on file  . Alcohol Use: No    Review of Systems  Constitutional: Negative for fever and chills.  HENT:       Upper lip swelling  Skin: Negative for rash.  Psychiatric/Behavioral: Negative for confusion.      Allergies  Review of patient's allergies indicates no known allergies.  Home Medications   Current Outpatient Rx  Name  Route  Sig  Dispense  Refill  . diphenhydrAMINE (BENADRYL) 25 MG tablet   Oral   Take 1 tablet (25 mg total) by mouth every 6 (six) hours.   20  tablet   0   . esomeprazole (NEXIUM) 40 MG capsule   Oral   Take 40 mg by mouth daily at 12 noon.         . famotidine (PEPCID) 20 MG tablet   Oral   Take 1 tablet (20 mg total) by mouth 2 (two) times daily.   30 tablet   0   . hydrochlorothiazide (HYDRODIURIL) 25 MG tablet   Oral   Take 1 tablet (25 mg total) by mouth daily.   30 tablet   0   . hydrochlorothiazide (HYDRODIURIL) 25 MG tablet   Oral   Take 25 mg by mouth daily.         . naproxen sodium (ANAPROX) 220 MG tablet   Oral   Take 440 mg by mouth daily as needed (pain).         . predniSONE (DELTASONE) 20 MG tablet      3 tabs po daily x 4 days   12 tablet   0    Triage Vitals: BP 150/101  Pulse 97  Temp(Src) 98.1 F (36.7 C) (Oral)  Resp 14  Ht 5\' 11"  (1.803 m)  Wt 204 lb (92.534 kg)  BMI 28.46 kg/m2  SpO2 100%   Physical Exam  Nursing note and vitals  reviewed. Constitutional: He is oriented to person, place, and time. He appears well-developed and well-nourished.  HENT:  Head: Normocephalic and atraumatic.  Mouth/Throat: Oropharynx is clear and moist.  Upper lip swelling without intraoral swelling consistent with angioedema, mild  Eyes: EOM are normal.  Neck: Normal range of motion.  Cardiovascular: Normal rate.   Pulmonary/Chest: Effort normal. No stridor. He has no wheezes. He has no rales.  Musculoskeletal: Normal range of motion.  Neurological: He is alert and oriented to person, place, and time.  Skin: Skin is warm and dry.  Psychiatric: He has a normal mood and affect. His behavior is normal.    ED Course  Procedures (including critical care time)  DIAGNOSTIC STUDIES: Oxygen Saturation is 100% on RA, Normal by my interpretation.    COORDINATION OF CARE: 8:46 PM- Will give Benadryl and Pepcid. Discussed treatment plan with pt at bedside and pt agreed to plan.     Labs Review Labs Reviewed - No data to display Imaging Review No results found.   EKG Interpretation None       MDM   Final diagnoses:  None    1. Angioedema  Mild symptoms that are not causing any airway compromise and that have been untreated by the patient. Medications given and recommended. Stable for discharge.   I personally performed the services described in this documentation, which was scribed in my presence. The recorded information has been reviewed and is accurate.     Dewaine Oats, PA-C 12/23/13 2006

## 2013-12-14 NOTE — ED Provider Notes (Signed)
CSN: [id number]     Arrival date & time 12/14/13  5643 History   First MD Initiated Contact with Patient 12/14/13 1111     Chief Complaint  Patient presents with  . Facial Swelling     (Consider location/radiation/quality/duration/timing/severity/associated sxs/prior Treatment) HPI Comments: Patient is a 53 year old male with a past medical history of hypertension who presents to the emergency department complaining of left-sided facial swelling beginning while he was asleep last night. Patient states he took a lisinopril prior to going to sleep and believes that is why. He was seen in the emergency department on March 8 for similar symptoms, he was advised to discontinue taking lisinopril and put on hydrochlorothiazide. Patient states he ran out of hydrochlorothiazide, last night he noticed his hands were swelling and had a headache so he decided to take the lisinopril. Hand swelling and headache have subsided, however when he woke up he noticed a left-sided this space swollen around his lips. Denies tongue swelling, difficulty swallowing or breathing. He does not have a primary care physician. No other known exposures.  The history is provided by the patient.    Past Medical History  Diagnosis Date  . Hypertension   . Ruptured lumbar disc    Past Surgical History  Procedure Laterality Date  . Back surgery    . Abdominal surgery     History reviewed. No pertinent family history. History  Substance Use Topics  . Smoking status: Current Some Day Smoker  . Smokeless tobacco: Not on file  . Alcohol Use: No    Review of Systems  HENT: Positive for facial swelling.   All other systems reviewed and are negative.      Allergies  Review of patient's allergies indicates no known allergies.  Home Medications   Current Outpatient Rx  Name  Route  Sig  Dispense  Refill  . esomeprazole (NEXIUM) 40 MG capsule   Oral   Take 40 mg by mouth daily at 12 noon.         .  hydrochlorothiazide (HYDRODIURIL) 25 MG tablet   Oral   Take 25 mg by mouth daily.         . naproxen sodium (ANAPROX) 220 MG tablet   Oral   Take 440 mg by mouth daily as needed (pain).          BP 149/98  Pulse 80  Temp(Src) 98.6 F (37 C) (Oral)  Resp 20  SpO2 97% Physical Exam  Nursing note and vitals reviewed. Constitutional: He is oriented to person, place, and time. He appears well-developed and well-nourished. No distress.  HENT:  Head: Normocephalic and atraumatic.  Mouth/Throat: Uvula is midline and oropharynx is clear and moist. Normal dentition. No dental abscesses or uvula swelling.  Left side upper and lower lip edematous, nontender, no erythema or warmth. Mild swelling of left cheek. No abscess.  Eyes: Conjunctivae are normal.  Neck: Normal range of motion. Neck supple. No tracheal deviation present.  Cardiovascular: Normal rate, regular rhythm and normal heart sounds.   Pulmonary/Chest: Effort normal and breath sounds normal. Not tachypneic. No respiratory distress. He has no decreased breath sounds. He has no wheezes.  Musculoskeletal: Normal range of motion. He exhibits no edema.  Neurological: He is alert and oriented to person, place, and time.  Skin: Skin is warm and dry. No rash noted. He is not diaphoretic.  Psychiatric: He has a normal mood and affect. His behavior is normal.    ED Course  Procedures (including  critical care time) Labs Review Labs Reviewed - No data to display Imaging Review No results found.   EKG Interpretation None      MDM   Final diagnoses:  ACE inhibitor-aggravated angioedema   Patient presenting with left-sided lip and facial swelling. He is well appearing and in no apparent distress, no respiratory or airway compromise. No signs of infection. Vital signs stable. Plan to give Solu-Medrol, Benadryl and Pepcid, monitor patient. 2:11 PM Pt's swelling still present but have improved. Swelling has gone down. Pt remains  asymptomatic. He has been in the ED >4 hours without any worsening symptoms. Stable for d/c. D/c with short course of prednisone, pepcid and benadryl. Discussed importance of no longer taking lisinopril. Will refill HCTZ. Resources given for PCP f/u.   Illene Labrador, PA-C 12/14/13 1416

## 2013-12-14 NOTE — ED Notes (Signed)
Pt states he was seen in ED earlier today for swelling to L side of face.  States he was told to discontinue his HCTZ and start new BP medication.  Told to return if symptoms got worse.  C/o swelling to upper lip since 6pm.  Denies SOB.  Speaking in complete sentences.

## 2013-12-24 NOTE — ED Provider Notes (Signed)
Medical screening examination/treatment/procedure(s) were performed by non-physician practitioner and as supervising physician I was immediately available for consultation/collaboration.   EKG Interpretation None        Benecio Kluger S Heba Ige, MD 12/24/13 1316 

## 2014-08-03 ENCOUNTER — Emergency Department (HOSPITAL_COMMUNITY)
Admission: EM | Admit: 2014-08-03 | Discharge: 2014-08-03 | Disposition: A | Discharge status: Home / Self Care | Payer: No Typology Code available for payment source | Attending: Emergency Medicine | Admitting: Emergency Medicine

## 2014-08-03 ENCOUNTER — Encounter (HOSPITAL_COMMUNITY): Payer: Self-pay | Admitting: Emergency Medicine

## 2014-08-03 DIAGNOSIS — M62831 Muscle spasm of calf: Secondary | ICD-10-CM | POA: Diagnosis not present

## 2014-08-03 DIAGNOSIS — Y9389 Activity, other specified: Secondary | ICD-10-CM | POA: Diagnosis not present

## 2014-08-03 DIAGNOSIS — M7061 Trochanteric bursitis, right hip: Secondary | ICD-10-CM | POA: Diagnosis not present

## 2014-08-03 DIAGNOSIS — M549 Dorsalgia, unspecified: Secondary | ICD-10-CM | POA: Diagnosis present

## 2014-08-03 DIAGNOSIS — M7631 Iliotibial band syndrome, right leg: Secondary | ICD-10-CM | POA: Insufficient documentation

## 2014-08-03 DIAGNOSIS — M545 Low back pain, unspecified: Secondary | ICD-10-CM

## 2014-08-03 DIAGNOSIS — I10 Essential (primary) hypertension: Secondary | ICD-10-CM | POA: Diagnosis not present

## 2014-08-03 DIAGNOSIS — Z9889 Other specified postprocedural states: Secondary | ICD-10-CM | POA: Diagnosis not present

## 2014-08-03 DIAGNOSIS — M62838 Other muscle spasm: Secondary | ICD-10-CM

## 2014-08-03 DIAGNOSIS — Z720 Tobacco use: Secondary | ICD-10-CM | POA: Insufficient documentation

## 2014-08-03 DIAGNOSIS — Z79899 Other long term (current) drug therapy: Secondary | ICD-10-CM | POA: Insufficient documentation

## 2014-08-03 MED ORDER — OXYCODONE-ACETAMINOPHEN 5-325 MG PO TABS: 1.0000 | ORAL_TABLET | Freq: Four times a day (QID) | ORAL | Status: DC | PRN

## 2014-08-03 MED ORDER — OXYCODONE-ACETAMINOPHEN 5-325 MG PO TABS
1.0000 | ORAL_TABLET | Freq: Once | ORAL | Status: AC
  Administered 2014-08-03: 1 via ORAL
  Filled 2014-08-03: qty 1

## 2014-08-03 MED ORDER — CYCLOBENZAPRINE HCL 10 MG PO TABS: 10.0000 mg | ORAL_TABLET | Freq: Three times a day (TID) | ORAL | Status: DC | PRN

## 2014-08-03 MED ORDER — NAPROXEN 500 MG PO TABS
500.0000 mg | ORAL_TABLET | Freq: Two times a day (BID) | ORAL | Status: DC | PRN
Start: 2014-08-03 — End: 2014-08-25

## 2014-08-03 NOTE — ED Notes (Signed)
Pt c/o lower back pain with radiation down right leg; pt sts hx of same and denies obvious injury

## 2014-08-03 NOTE — ED Notes (Signed)
Declined W/C at D/C and was escorted to lobby by RN. 

## 2014-08-03 NOTE — ED Provider Notes (Signed)
CSN: [id number]     Arrival date & time 08/03/14  1844 History  This chart was scribed for non-physician practitioner, Zacarias Pontes, PA-Cworking with Virgel Manifold, MD, by Erling Conte, ED Scribe. This patient was seen in room TR10C/TR10C and the patient's care was started at 8:54 PM.   Chief Complaint  Patient presents with  . Back Pain  . Leg Pain    Patient is a 53 y.o. male presenting with back pain. The history is provided by the patient. No language interpreter was used.  Back Pain Location:  Lumbar spine Quality:  Aching Radiates to:  L knee, L posterior upper leg and L thigh Pain severity now: "10/10" Pain is:  Worse during the night Onset quality:  Gradual Duration:  1 month Timing:  Constant Progression:  Waxing and waning Context: lifting heavy objects   Relieved by: walking and standing. Worsened by:  Heat and sitting Ineffective treatments: Ibuprofen. Associated symptoms: leg pain (left)   Associated symptoms: no abdominal pain, no abdominal swelling, no bladder incontinence, no bowel incontinence, no chest pain, no dysuria, no fever, no headaches, no numbness, no paresthesias, no pelvic pain, no perianal numbness, no tingling, no weakness and no weight loss   Risk factors: no hx of cancer, no hx of osteoporosis, no lack of exercise, no menopause, not obese, not pregnant, no recent surgery, no steroid use and no vascular disease     HPI Comments: Ryan Medina is a 53 y.o. male with a h/o HTN who presents to the Emergency Department complaining of constant, 10/10, "aching", R sided lower back/buttock pain that radiates down his lateral right thigh for 1 month. Pt states that the pain is worse at night or after immobility for long periods of time. The pain gets so bad that it wakes him up from sleep. The pain begins in the right lateral hip/ posterior buttock and extends to his right knee and into the lateral calf. Pt states that it may be due to when he was  moving heavy furniture 1 month ago, states he hit it on something. H/o of bulging disc in his back that required surgery. He notes that the pain is exacerbated with immobility. He sees minor relief with walking or standing on his leg. He previously took Ibuprofen which provided relief for 1 day but then the pain came back. Previously been seen at the [facility] for this pain but was told there was nothing they could do. They also gave him another medication but he is unable to remember the name at this time. Denies any numbness, tingling, weakness, saddle anesthesia, no urinary or bowel incontinence, hematuria, dysuria, flank pain, fever, nausea, vomiting, abd pain, warmth or color change to the area of right hip. No h/o cancer or unexplained weight loss.     Past Medical History  Diagnosis Date  . Hypertension   . Ruptured lumbar disc    Past Surgical History  Procedure Laterality Date  . Back surgery    . Abdominal surgery     History reviewed. No pertinent family history. History  Substance Use Topics  . Smoking status: Current Some Day Smoker  . Smokeless tobacco: Not on file  . Alcohol Use: No    Review of Systems  Constitutional: Negative for fever, chills, weight loss and unexpected weight change.  Respiratory: Negative for shortness of breath.   Cardiovascular: Negative for chest pain.  Gastrointestinal: Negative for nausea, vomiting, abdominal pain and bowel incontinence.  Genitourinary:  Negative for bladder incontinence, dysuria, hematuria, flank pain, difficulty urinating and pelvic pain.  Musculoskeletal: Positive for back pain and arthralgias (radiates from right hip down entire right leg). Negative for joint swelling.  Skin: Negative for color change.  Neurological: Negative for tingling, weakness, numbness, headaches and paresthesias.  10 Systems reviewed and all are negative for acute change except as noted in the HPI.      Allergies   Lisinopril  Home Medications   Prior to Admission medications   Medication Sig Start Date End Date Taking? Authorizing Provider  esomeprazole (NEXIUM) 40 MG capsule Take 40 mg by mouth daily at 12 noon.    Historical Provider, MD  hydrochlorothiazide (HYDRODIURIL) 25 MG tablet Take 25 mg by mouth daily.    Historical Provider, MD  naproxen sodium (ANAPROX) 220 MG tablet Take 440 mg by mouth daily as needed (pain).    Historical Provider, MD   Triage Vitals:  BP 147/93 mmHg  Pulse 83  Temp(Src) 98.2 F (36.8 C) (Oral)  Resp 18  SpO2 98%  Physical Exam  Constitutional: He is oriented to person, place, and time. Vital signs are normal. He appears well-developed and well-nourished.  Non-toxic appearance. No distress.  HENT:  Head: Normocephalic and atraumatic.  Mouth/Throat: Mucous membranes are normal.  Eyes: Conjunctivae and EOM are normal. Right eye exhibits no discharge. Left eye exhibits no discharge.  Neck: Normal range of motion. Neck supple.  Cardiovascular: Normal rate and intact distal pulses.   Distal pulses intact  Pulmonary/Chest: Effort normal. No respiratory distress.  Abdominal: Soft. Normal appearance. He exhibits no distension. There is no rigidity, no rebound and no guarding.  Musculoskeletal:       Right hip: He exhibits decreased range of motion (due to pain) and tenderness. He exhibits normal strength, no swelling, no crepitus and no deformity.       Legs: R greater trochanteric bursitis TTP and pain with spasm noted to IT band and lateral R thigh, mild pain in R buttock, no bony TTP or crepitus, no deformity, no erythema or warmth. ROM limited due to pain. Strength 5/5 in all extremities, sensation grossly intact in all extremities, gait nonantalgic  Neurological: He is alert and oriented to person, place, and time. He has normal strength. No sensory deficit.  Skin: Skin is warm, dry and intact. No erythema.  Psychiatric: He has a normal mood and affect. His  behavior is normal.  Nursing note and vitals reviewed.   ED Course  Procedures (including critical care time)  DIAGNOSTIC STUDIES: Oxygen Saturation is 98% on RA, normal by my interpretation.    COORDINATION OF CARE: 9:40 PM- Will order pain medication and muscle relaxers. Advised pt to return if symptoms get worse or if he develops any new numbness or new symptoms. Pt advised of plan for treatment and pt agrees.     Labs Review Labs Reviewed - No data to display  Imaging Review No results found.   EKG Interpretation None      MDM   Final diagnoses:  Greater trochanteric bursitis, right  IT band syndrome, right  Muscle spasm of right leg  Right-sided low back pain without sciatica    53y/o male with R hip trochanteric bursitis after hitting it on an object when moving furniture last month. Pain over bursa, no inflammation or erythema, doubt septic joint or gout. No red flag s/sx for back pain, no cauda equina symptoms, extremities neurovascularly intact. Doubt need for imaging as this doesn't seem to  involve the hip joint. IT band tender with mild spasm. Discussed pain med use, heat, and massage to help, as well as flexeril. Discussed f/up with his PCP clinic in 1 month if this hasn't resolved, and return precautions given if any worsening occurs. I explained the diagnosis and have given explicit precautions to return to the ER including for any other new or worsening symptoms. The patient understands and accepts the medical plan as it's been dictated and I have answered their questions. Discharge instructions concerning home care and prescriptions have been given. The patient is STABLE and is discharged to home in good condition.   I personally performed the services described in this documentation, which was scribed in my presence. The recorded information has been reviewed and is accurate.  BP 147/93 mmHg  Pulse 83  Temp(Src) 98.2 F (36.8 C) (Oral)  Resp 18  SpO2  98%  Meds ordered this encounter  Medications  . oxyCODONE-acetaminophen (PERCOCET/ROXICET) 5-325 MG per tablet 1 tablet    Sig:   . naproxen (NAPROSYN) 500 MG tablet    Sig: Take 1 tablet (500 mg total) by mouth 2 (two) times daily as needed for mild pain, moderate pain or headache (TAKE WITH MEALS.).    Dispense:  20 tablet    Refill:  0    Order Specific Question:  Supervising Provider    Answer:  Noemi Chapel D [[id number]]  . oxyCODONE-acetaminophen (PERCOCET) 5-325 MG per tablet    Sig: Take 1-2 tablets by mouth every 6 (six) hours as needed for severe pain.    Dispense:  6 tablet    Refill:  0    Order Specific Question:  Supervising Provider    Answer:  Noemi Chapel D [[id number]]  . cyclobenzaprine (FLEXERIL) 10 MG tablet    Sig: Take 1 tablet (10 mg total) by mouth 3 (three) times daily as needed for muscle spasms.    Dispense:  15 tablet    Refill:  0    Order Specific Question:  Supervising Provider    Answer:  Johnna Acosta [[id number]]      [place], PA-C 08/03/14 2204  Virgel Manifold, MD 08/10/14 [phone]

## 2014-08-03 NOTE — Discharge Instructions (Signed)
Take naprosyn as directed for inflammation and pain with percocet for breakthrough pain and flexeril for muscle relaxation. Do not drive or operate machinery with pain medication or muscle relaxation use. Use heat to areas of soreness, no more than 20 minutes at a time every hour. Use massage with a tennis ball to help relieve the spasms and pain. Follow up with primary care physician for recheck of ongoing symptoms in 1 month. Return to ER for emergent changing or worsening of symptoms.    Hip Bursitis Bursitis is a puffiness (swelling) and soreness of a fluid-filled sac (bursa). This sac covers and protects the joint. HOME CARE  Put ice on the injured area.  Put ice in a plastic bag.  Place a towel between your skin and the bag.  Leave the ice on for 15-20 minutes, 03-04 times a day.  Rest the painful joint as much as possible. Move your joint at least 4 times a day. When pain lessens, start normal, slow movements and normal activities.  Only take medicine as told by your doctor.  Use crutches as told.  Raise (elevate) your painful joint. Use pillows for propping your legs and hips.  Get a massage to lessen pain. GET HELP RIGHT AWAY IF:  Your pain increases or does not improve during treatment.  You have a fever.  You feel heat coming from the affected area.  You see redness and puffiness around the affected area.  You have any questions or concerns. MAKE SURE YOU:  Understand these instructions.  Will watch your condition.  Will get help right away if you are not well or get worse. Document Released: 10/11/2010 Document Revised: 12/01/2011 Document Reviewed: 10/11/2010 [facility] Patient Information 2015 [place], [place]. This information is not intended to replace advice given to you by your health care provider. Make sure you discuss any questions you have with your health care provider.  Iliotibial Band Syndrome Iliotibial band syndrome is pain in the outer, lower  thigh. The pain is caused by an inflammation of the iliotibial band. This is a band of thick fibrous tissue that runs down the outside of the thigh. The iliotibial band begins at the hip. It extends to the outer side of the shin bone (tibia) just below the knee joint. The band works with the thigh muscles. Together they provide stability to the outside of the knee joint. Iliotibial band syndrome occurs when there is inflammation to this band of tissue. This is typically due to over use and not due to an injury. The irritation usually occurs over the outside of the knee joint, at the the end of the thigh bone (femur). The iliotibial band crosses bone and muscle at this point. Between these structures is a cushioning sac (bursa). The bursa should make possible a smooth gliding motion. However, when inflamed, the iliotibial band does not glide easily. When inflamed, there is pain with motion of the knee. Usually the pain worsens with continued movement and the pain goes away with rest. This problem usually arises when there is a sudden increase in sports activities involving your legs. Running, and playing soccer or basketball are examples of activities causing this. Others who are prone to iliotibial band syndrome include individuals with mechanical problems such as leg length differences, abnormality of walking, bowed legs etc. HOME CARE INSTRUCTIONS   Apply ice to the injured area:  Put ice in a plastic bag.  Place a towel between your skin and the bag.  Leave the ice on  for 20 minutes, 2-3 times a day.  Limit excessive training or eliminate training until pain goes away.  While pain is present, you may use gentle range of motion. Do not resume regular use until instructed by your health care provider. Begin use gradually. Do not increase activity to the point of pain. If pain does develop, decrease activity and continue the above measures. Gradually increase activities that do not cause discomfort.  Do this until you finally achieve normal use.  Perform low-impact activities while pain is present. Wear proper footwear.  Only take over-the-counter or prescription medicines for pain, discomfort, or fever as directed by your health care provider. SEEK MEDICAL CARE IF:   Your pain increases or pain is not controlled with medications.  You develop new, unexplained symptoms, or an increase of the symptoms that brought you to your health care provider.  Your pain and symptoms are not improving or are getting worse. Document Released: 02/28/2002 Document Revised: 06/29/2013 Document Reviewed: 04/07/2013 [facility] Patient Information 2015 [place], [place]. This information is not intended to replace advice given to you by your health care provider. Make sure you discuss any questions you have with your health care provider.  Heat Therapy Heat therapy can help ease sore, stiff, injured, and tight muscles and joints. Heat relaxes your muscles, which may help ease your pain.  RISKS AND COMPLICATIONS If you have any of the following conditions, do not use heat therapy unless your health care provider has approved:  Poor circulation.  Healing wounds or scarred skin in the area being treated.  Diabetes, heart disease, or high blood pressure.  Not being able to feel (numbness) the area being treated.  Unusual swelling of the area being treated.  Active infections.  Blood clots.  Cancer.  Inability to communicate pain. This may include young children and people who have problems with their brain function (dementia).  Pregnancy. Heat therapy should only be used on old, pre-existing, or long-lasting (chronic) injuries. Do not use heat therapy on new injuries unless directed by your health care provider. HOW TO USE HEAT THERAPY There are several different kinds of heat therapy, including:  Moist heat pack.  Warm water bath.  Hot water bottle.  Electric heating pad.  Heated gel  pack.  Heated wrap.  Electric heating pad. Use the heat therapy method suggested by your health care provider. Follow your health care provider's instructions on when and how to use heat therapy. GENERAL HEAT THERAPY RECOMMENDATIONS  Do not sleep while using heat therapy. Only use heat therapy while you are awake.  Your skin may turn pink while using heat therapy. Do not use heat therapy if your skin turns red.  Do not use heat therapy if you have new pain.  High heat or long exposure to heat can cause burns. Be careful when using heat therapy to avoid burning your skin.  Do not use heat therapy on areas of your skin that are already irritated, such as with a rash or sunburn. SEEK MEDICAL CARE IF:  You have blisters, redness, swelling, or numbness.  You have new pain.  Your pain is worse. MAKE SURE YOU:  Understand these instructions.  Will watch your condition.  Will get help right away if you are not doing well or get worse. Document Released: 12/01/2011 Document Revised: 01/23/2014 Document Reviewed: 11/01/2013 [facility] Patient Information 2015 [place], [place]. This information is not intended to replace advice given to you by your health care provider. Make sure you discuss any  questions you have with your health care provider.

## 2014-08-11 ENCOUNTER — Encounter (HOSPITAL_COMMUNITY): Payer: Self-pay | Admitting: Emergency Medicine

## 2014-08-11 ENCOUNTER — Emergency Department (HOSPITAL_COMMUNITY)
Admission: EM | Admit: 2014-08-11 | Discharge: 2014-08-11 | Disposition: A | Discharge status: Home / Self Care | Payer: No Typology Code available for payment source | Attending: Emergency Medicine | Admitting: Emergency Medicine

## 2014-08-11 DIAGNOSIS — I10 Essential (primary) hypertension: Secondary | ICD-10-CM | POA: Diagnosis not present

## 2014-08-11 DIAGNOSIS — M5441 Lumbago with sciatica, right side: Secondary | ICD-10-CM | POA: Diagnosis not present

## 2014-08-11 DIAGNOSIS — M545 Low back pain: Secondary | ICD-10-CM | POA: Diagnosis present

## 2014-08-11 DIAGNOSIS — Z9889 Other specified postprocedural states: Secondary | ICD-10-CM | POA: Insufficient documentation

## 2014-08-11 DIAGNOSIS — Z791 Long term (current) use of non-steroidal anti-inflammatories (NSAID): Secondary | ICD-10-CM | POA: Insufficient documentation

## 2014-08-11 DIAGNOSIS — Z79899 Other long term (current) drug therapy: Secondary | ICD-10-CM | POA: Insufficient documentation

## 2014-08-11 DIAGNOSIS — G8929 Other chronic pain: Secondary | ICD-10-CM | POA: Insufficient documentation

## 2014-08-11 DIAGNOSIS — Z720 Tobacco use: Secondary | ICD-10-CM | POA: Insufficient documentation

## 2014-08-11 HISTORY — DX: Other chronic pain: G89.29

## 2014-08-11 HISTORY — DX: Dorsalgia, unspecified: M54.9

## 2014-08-11 MED ORDER — METHYLPREDNISOLONE SODIUM SUCC 125 MG IJ SOLR
125.0000 mg | Freq: Once | INTRAMUSCULAR | Status: AC
  Administered 2014-08-11: 125 mg via INTRAMUSCULAR
  Filled 2014-08-11: qty 2

## 2014-08-11 MED ORDER — TRAMADOL HCL 50 MG PO TABS: 50.0000 mg | ORAL_TABLET | Freq: Four times a day (QID) | ORAL | Status: DC | PRN

## 2014-08-11 MED ORDER — OXYCODONE-ACETAMINOPHEN 5-325 MG PO TABS
2.0000 | ORAL_TABLET | Freq: Once | ORAL | Status: AC
  Administered 2014-08-11: 2 via ORAL
  Filled 2014-08-11: qty 2

## 2014-08-11 MED ORDER — PREDNISONE 10 MG PO TABS: 20.0000 mg | ORAL_TABLET | Freq: Every day | ORAL | Status: DC

## 2014-08-11 NOTE — ED Notes (Addendum)
Pt. reports pain at right lower back radiating to right leg for 1 1/2 months  , denies injury /ambulatory , no urinary discomfort . Pt. seen here 08/03/2014 prescribed with pain medications but did not fill prescriptions .

## 2014-08-11 NOTE — ED Provider Notes (Signed)
CSN: [id number]     Arrival date & time 08/11/14  2149 History   First MD Initiated Contact with Patient 08/11/14 2227     Chief Complaint  Patient presents with  . Back Pain     (Consider location/radiation/quality/duration/timing/severity/associated sxs/prior Treatment) Patient is a 53 y.o. male presenting with back pain. The history is provided by the patient (the pt states he has pain in his back radiating down his right leg.  the pt did not fill his medicine for this recently).  Back Pain Location:  Generalized Quality:  Aching Radiates to:  R posterior upper leg Pain severity:  Moderate Pain is:  Same all the time Onset quality:  Gradual Timing:  Constant Context: emotional stress   Associated symptoms: no abdominal pain, no chest pain and no headaches     Past Medical History  Diagnosis Date  . Hypertension   . Ruptured lumbar disc   . Back pain, chronic    Past Surgical History  Procedure Laterality Date  . Back surgery    . Abdominal surgery     No family history on file. History  Substance Use Topics  . Smoking status: Current Some Day Smoker  . Smokeless tobacco: Not on file  . Alcohol Use: No    Review of Systems  Constitutional: Negative for appetite change and fatigue.  HENT: Negative for congestion, ear discharge and sinus pressure.   Eyes: Negative for discharge.  Respiratory: Negative for cough.   Cardiovascular: Negative for chest pain.  Gastrointestinal: Negative for abdominal pain and diarrhea.  Genitourinary: Negative for frequency and hematuria.  Musculoskeletal: Positive for back pain.  Skin: Negative for rash.  Neurological: Negative for seizures and headaches.  Psychiatric/Behavioral: Negative for hallucinations.      Allergies  Lisinopril  Home Medications   Prior to Admission medications   Medication Sig Start Date End Date Taking? Authorizing Provider  cyclobenzaprine (FLEXERIL) 10 MG tablet Take 1 tablet (10 mg total) by  mouth 3 (three) times daily as needed for muscle spasms. 08/03/14   Mercedes Strupp Camprubi-Soms, PA-C  esomeprazole (NEXIUM) 40 MG capsule Take 40 mg by mouth daily at 12 noon.    Historical Provider, MD  hydrochlorothiazide (HYDRODIURIL) 25 MG tablet Take 25 mg by mouth daily.    Historical Provider, MD  naproxen (NAPROSYN) 500 MG tablet Take 1 tablet (500 mg total) by mouth 2 (two) times daily as needed for mild pain, moderate pain or headache (TAKE WITH MEALS.). 08/03/14   Mercedes Strupp Camprubi-Soms, PA-C  naproxen sodium (ANAPROX) 220 MG tablet Take 440 mg by mouth daily as needed (pain).    Historical Provider, MD  oxyCODONE-acetaminophen (PERCOCET) 5-325 MG per tablet Take 1-2 tablets by mouth every 6 (six) hours as needed for severe pain. 08/03/14   Mercedes Strupp Camprubi-Soms, PA-C  predniSONE (DELTASONE) 10 MG tablet Take 2 tablets (20 mg total) by mouth daily. 08/11/14   Maudry Diego, MD  traMADol (ULTRAM) 50 MG tablet Take 1 tablet (50 mg total) by mouth every 6 (six) hours as needed. 08/11/14   Maudry Diego, MD   BP 130/98 mmHg  Pulse 92  Temp(Src) 97.9 F (36.6 C) (Oral)  Resp 20  Ht 5\' 11"  (1.803 m)  Wt 185 lb (83.915 kg)  BMI 25.81 kg/m2  SpO2 100% Physical Exam  Constitutional: He is oriented to person, place, and time. He appears well-developed.  HENT:  Head: Normocephalic.  Eyes: Conjunctivae and EOM are normal. No scleral icterus.  Neck:  Neck supple. No thyromegaly present.  Cardiovascular: Normal rate and regular rhythm.  Exam reveals no gallop and no friction rub.   No murmur heard. Pulmonary/Chest: No stridor. He has no wheezes. He has no rales. He exhibits no tenderness.  Abdominal: He exhibits no distension. There is no tenderness. There is no rebound.  Musculoskeletal: Normal range of motion.  Tender lumbar spine with pos. Straight leg raise on right  Lymphadenopathy:    He has no cervical adenopathy.  Neurological: He is oriented to person,  place, and time. He has normal reflexes. He exhibits normal muscle tone. Coordination normal.  Skin: No rash noted. No erythema.  Psychiatric: He has a normal mood and affect. His behavior is normal.    ED Course  Procedures (including critical care time) Labs Review Labs Reviewed - No data to display  Imaging Review No results found.   EKG Interpretation None      MDM   Final diagnoses:  Midline low back pain with right-sided sciatica    sciatica    Maudry Diego, MD 08/11/14 2239

## 2014-08-11 NOTE — Discharge Instructions (Signed)
Follow up if not improving

## 2014-08-14 ENCOUNTER — Emergency Department (HOSPITAL_COMMUNITY)
Admission: EM | Admit: 2014-08-14 | Discharge: 2014-08-14 | Disposition: A | Discharge status: Home / Self Care | Payer: No Typology Code available for payment source | Attending: Emergency Medicine | Admitting: Emergency Medicine

## 2014-08-14 ENCOUNTER — Encounter (HOSPITAL_COMMUNITY): Payer: Self-pay | Admitting: Emergency Medicine

## 2014-08-14 DIAGNOSIS — Z720 Tobacco use: Secondary | ICD-10-CM | POA: Insufficient documentation

## 2014-08-14 DIAGNOSIS — G8929 Other chronic pain: Secondary | ICD-10-CM | POA: Insufficient documentation

## 2014-08-14 DIAGNOSIS — I10 Essential (primary) hypertension: Secondary | ICD-10-CM | POA: Insufficient documentation

## 2014-08-14 DIAGNOSIS — Z79899 Other long term (current) drug therapy: Secondary | ICD-10-CM | POA: Diagnosis not present

## 2014-08-14 DIAGNOSIS — Z791 Long term (current) use of non-steroidal anti-inflammatories (NSAID): Secondary | ICD-10-CM | POA: Insufficient documentation

## 2014-08-14 DIAGNOSIS — M5441 Lumbago with sciatica, right side: Secondary | ICD-10-CM | POA: Diagnosis not present

## 2014-08-14 DIAGNOSIS — M79604 Pain in right leg: Secondary | ICD-10-CM | POA: Diagnosis present

## 2014-08-14 DIAGNOSIS — Z7952 Long term (current) use of systemic steroids: Secondary | ICD-10-CM | POA: Diagnosis not present

## 2014-08-14 MED ORDER — CYCLOBENZAPRINE HCL 10 MG PO TABS: 10.0000 mg | ORAL_TABLET | Freq: Three times a day (TID) | ORAL | Status: DC | PRN

## 2014-08-14 MED ORDER — KETOROLAC TROMETHAMINE 60 MG/2ML IM SOLN
60.0000 mg | Freq: Once | INTRAMUSCULAR | Status: AC
  Administered 2014-08-14: 60 mg via INTRAMUSCULAR
  Filled 2014-08-14: qty 2

## 2014-08-14 NOTE — ED Notes (Signed)
Pt presents with ongoing right leg pain for the past month- pt reports he has been seen here twice for the same but is unable to afford to fill pain medications.  Pt admits to having prescriptions for pain medications but is unable to get them filled.  Denies new injury.

## 2014-08-14 NOTE — ED Provider Notes (Signed)
CSN: [id number]     Arrival date & time 08/14/14  1710 History   This chart was scribed for non-physician practitioner, Abigail Butts, PA-C, working with Carmin Muskrat, MD, by Jeanell Sparrow, ED Scribe. This patient was seen in room TR08C/TR08C and the patient's care was started at 5:46 PM.    Chief Complaint  Patient presents with  . Leg Pain   The history is provided by the patient and medical records. No language interpreter was used.   HPI Comments: Ryan Medina is a 53 y.o. male who presents to the Emergency Department complaining of constant moderate right lower extremity pain that started a month ago. He states that he was seen here twice for the pain, but was unable to afford to fill medications. He denies any new injury. He reports that his pain radiates down to his calf area with associated right lower back pain. He states that his pain is worse at night with an aching sensation. He reports that he had a hx of back surgery for a ruptured disc more than 15 years ago. Patient denies saddle anesthesia, gait disturbance, loss of bowel or bladder control, weakness, numbness. He has not tried any treatments prior to arrival. No alleviating factors, walking makes the pain worse.  Patient denies history of cancer, IVDU use, anticoagulants.  No known trauma.  Past Medical History  Diagnosis Date  . Hypertension   . Ruptured lumbar disc   . Back pain, chronic    Past Surgical History  Procedure Laterality Date  . Back surgery    . Abdominal surgery     No family history on file. History  Substance Use Topics  . Smoking status: Current Some Day Smoker  . Smokeless tobacco: Not on file  . Alcohol Use: No    Review of Systems  Constitutional: Negative for fever and fatigue.  Respiratory: Negative for chest tightness and shortness of breath.   Cardiovascular: Negative for chest pain.  Gastrointestinal: Negative for nausea, vomiting, abdominal pain and diarrhea.   Genitourinary: Negative for dysuria, urgency, frequency and hematuria.  Musculoskeletal: Positive for myalgias (right leg), back pain and gait problem ( 2/2 pain). Negative for joint swelling, neck pain and neck stiffness.  Skin: Negative for rash.  Neurological: Negative for weakness, light-headedness, numbness and headaches.  All other systems reviewed and are negative.   Allergies  Lisinopril  Home Medications   Prior to Admission medications   Medication Sig Start Date End Date Taking? Authorizing Provider  cyclobenzaprine (FLEXERIL) 10 MG tablet Take 1 tablet (10 mg total) by mouth 3 (three) times daily as needed for muscle spasms. 08/14/14   Nikiah Goin, PA-C  esomeprazole (NEXIUM) 40 MG capsule Take 40 mg by mouth daily at 12 noon.    Historical Provider, MD  hydrochlorothiazide (HYDRODIURIL) 25 MG tablet Take 25 mg by mouth daily.    Historical Provider, MD  naproxen (NAPROSYN) 500 MG tablet Take 1 tablet (500 mg total) by mouth 2 (two) times daily as needed for mild pain, moderate pain or headache (TAKE WITH MEALS.). 08/03/14   Mercedes Strupp Camprubi-Soms, PA-C  naproxen sodium (ANAPROX) 220 MG tablet Take 440 mg by mouth daily as needed (pain).    Historical Provider, MD  oxyCODONE-acetaminophen (PERCOCET) 5-325 MG per tablet Take 1-2 tablets by mouth every 6 (six) hours as needed for severe pain. 08/03/14   Mercedes Strupp Camprubi-Soms, PA-C  predniSONE (DELTASONE) 10 MG tablet Take 2 tablets (20 mg total) by mouth daily. 08/11/14  Maudry Diego, MD  traMADol (ULTRAM) 50 MG tablet Take 1 tablet (50 mg total) by mouth every 6 (six) hours as needed. 08/11/14   Maudry Diego, MD   BP 131/94 mmHg  Pulse 88  Temp(Src) 98.3 F (36.8 C) (Oral)  Resp 18  SpO2 96% Physical Exam  Constitutional: He appears well-developed and well-nourished. No distress.  HENT:  Head: Normocephalic and atraumatic.  Mouth/Throat: Oropharynx is clear and moist. No oropharyngeal  exudate.  Eyes: Conjunctivae are normal.  Neck: Normal range of motion. Neck supple.  Full ROM without pain  Cardiovascular: Normal rate, regular rhythm and intact distal pulses.   Pulmonary/Chest: Effort normal and breath sounds normal. No respiratory distress. He has no wheezes.  Abdominal: Soft. He exhibits no distension. There is no tenderness.  Musculoskeletal:  Full range of motion of the T-spine and L-spine with pain No tenderness to palpation of the spinous processes of the T-spine or L-spine Mild tenderness to palpation of the right paraspinous muscles of the L-spine Well-healed surgical scar over the lumbar spine Positive straight leg raise on right  Lymphadenopathy:    He has no cervical adenopathy.  Neurological: He is alert. He has normal reflexes.  Reflex Scores:      Bicep reflexes are 2+ on the right side and 2+ on the left side.      Brachioradialis reflexes are 2+ on the right side and 2+ on the left side.      Patellar reflexes are 2+ on the right side and 2+ on the left side.      Achilles reflexes are 2+ on the right side and 2+ on the left side. Speech is clear and goal oriented, follows commands Normal 5/5 strength in upper and lower extremities bilaterally including dorsiflexion and plantar flexion, strong and equal grip strength Sensation normal to light and sharp touch Moves extremities without ataxia, coordination intact Antalgic gait Normal balance No Clonus   Skin: Skin is warm and dry. No rash noted. He is not diaphoretic. No erythema.  Psychiatric: He has a normal mood and affect. His behavior is normal.  Nursing note and vitals reviewed.   ED Course  Procedures (including critical care time) DIAGNOSTIC STUDIES: Oxygen Saturation is 96% on RA, normal by my interpretation.    COORDINATION OF CARE: 5:50 PM- Pt advised of plan for treatment and pt agrees.  Labs Review Labs Reviewed - No data to display  Imaging Review No results found.   EKG  Interpretation None      MDM   Final diagnoses:  Right-sided low back pain with right-sided sciatica   Ryan Medina presents to the emergency department with acute on chronic back pain.  Record review shows the patient was seen on 08/11/2014 and prescribed tramadol and prednisone. He reports he was unable to fill these prescriptions and does indeed bring these prescriptions back with him.  No neurological deficits and normal neuro exam.  Patient can walk with an antalgic gait but weightbears without difficulty.  No loss of bowel or bladder control.  No concern for cauda equina.  No fever, night sweats, weight loss, h/o cancer, IVDU.  RICE protocol and pain medicine indicated and discussed with patient.  discussed with patient price of medications. Prednisone is $4 and I will prescribe Flexeril which is also $4. Patient has been evaluated by case management to assist with filling medications. He will be given resource guide to find a primary care physician for further evaluation and treatment. Discussed  reasons to return immediately to the emergency department including loss of bowel or bladder control, numbness, weakness or other symptoms.   BP 131/94 mmHg  Pulse 88  Temp(Src) 98.3 F (36.8 C) (Oral)  Resp 18  SpO2 96%  I personally performed the services described in this documentation, which was scribed in my presence. The recorded information has been reviewed and is accurate.    MDM Number of Diagnoses or Management Options     Abigail Butts, PA-C 08/14/14 1819  Carmin Muskrat, MD 08/15/14 0028

## 2014-08-14 NOTE — Discharge Instructions (Signed)
1. Medications: Flexeril, usual home medications - fill the prednisone and tramadol prescriptions 2. Treatment: rest, drink plenty of fluids, gentle stretching as discussed, alternate ice and heat 3. Follow Up: Please followup with your primary doctor in 3 days for discussion of your diagnoses and further evaluation after today's visit; if you do not have a primary care doctor use the resource guide provided to find one;  Return to the ER for worsening back pain, difficulty walking, loss of bowel or bladder control or other concerning symptoms   Back Exercises Back exercises help treat and prevent back injuries. The goal of back exercises is to increase the strength of your abdominal and back muscles and the flexibility of your back. These exercises should be started when you no longer have back pain. Back exercises include:  Pelvic Tilt. Lie on your back with your knees bent. Tilt your pelvis until the lower part of your back is against the floor. Hold this position 5 to 10 sec and repeat 5 to 10 times.  Knee to Chest. Pull first 1 knee up against your chest and hold for 20 to 30 seconds, repeat this with the other knee, and then both knees. This may be done with the other leg straight or bent, whichever feels better.  Sit-Ups or Curl-Ups. Bend your knees 90 degrees. Start with tilting your pelvis, and do a partial, slow sit-up, lifting your trunk only 30 to 45 degrees off the floor. Take at least 2 to 3 seconds for each sit-up. Do not do sit-ups with your knees out straight. If partial sit-ups are difficult, simply do the above but with only tightening your abdominal muscles and holding it as directed.  Hip-Lift. Lie on your back with your knees flexed 90 degrees. Push down with your feet and shoulders as you raise your hips a couple inches off the floor; hold for 10 seconds, repeat 5 to 10 times.  Back arches. Lie on your stomach, propping yourself up on bent elbows. Slowly press on your hands,  causing an arch in your low back. Repeat 3 to 5 times. Any initial stiffness and discomfort should lessen with repetition over time.  Shoulder-Lifts. Lie face down with arms beside your body. Keep hips and torso pressed to floor as you slowly lift your head and shoulders off the floor. Do not overdo your exercises, especially in the beginning. Exercises may cause you some mild back discomfort which lasts for a few minutes; however, if the pain is more severe, or lasts for more than 15 minutes, do not continue exercises until you see your caregiver. Improvement with exercise therapy for back problems is slow.  See your caregivers for assistance with developing a proper back exercise program. Document Released: 10/16/2004 Document Revised: 12/01/2011 Document Reviewed: 07/10/2011 [facility] Patient Information 2015 [place], [place]. This information is not intended to replace advice given to you by your health care provider. Make sure you discuss any questions you have with your health care provider.    Emergency Department Resource Guide 1) Find a Doctor and Pay Out of Pocket Although you won't have to find out who is covered by your insurance plan, it is a good idea to ask around and get recommendations. You will then need to call the office and see if the doctor you have chosen will accept you as a new patient and what types of options they offer for patients who are self-pay. Some doctors offer discounts or will set up payment plans for their patients  who do not have insurance, but you will need to ask so you aren't surprised when you get to your appointment.  2) Contact Your Local Health Department Not all health departments have doctors that can see patients for sick visits, but many do, so it is worth a call to see if yours does. If you don't know where your local health department is, you can check in your phone book. The CDC also has a tool to help you locate your state's health department, and  many state websites also have listings of all of their local health departments.  3) Find a [facility] If your illness is not likely to be very severe or complicated, you may want to try a walk in clinic. These are popping up all over the country in pharmacies, drugstores, and shopping centers. They're usually staffed by nurse practitioners or physician assistants that have been trained to treat common illnesses and complaints. They're usually fairly quick and inexpensive. However, if you have serious medical issues or chronic medical problems, these are probably not your best option.  No Primary Care Doctor: - Call Health Connect at  [phone] - they can help you locate a primary care doctor that  accepts your insurance, provides certain services, etc. - Physician Referral Service- [phone]  Chronic Pain Problems: Organization         Address  Phone   Notes  [facility]  [phone] Patients need to be referred by their primary care doctor.   Medication Assistance: Organization         Address  Phone   Notes  [facility] Medication [facility] [address] [place], [place] [address] [phone] --Must be a resident of [facility] -- Must have NO insurance coverage whatsoever (no Medicaid/ Medicare, etc.) -- The pt. MUST have a primary care doctor that directs their care regularly and follows them in the community   [facility]ist  [phone]   [employer]  [phone]    Agencies that provide inexpensive medical care: Organization         Address  Phone   Notes  [place]  [phone]   [facility]    [phone]   [facility] [place], [place] [address] [phone]   [place] [address] [place] [phone]   Planned Parenthood    [phone]   [facility]    [phone]   [place]  and [place] [address], [place] Phone:  [phone], Fax:  [phone] Hours of Operation:  9 am - 6 pm, M-F.  Also accepts Medicaid/Medicare and self-pay.  [facility] for [place] [address], [address], [place] Phone: [phone], Fax: [phone]. Hours of Operation:  8:30 am - 5:30 pm, M-F.  Also accepts Medicaid and self-pay.  [facility] [address] [place] Phone: [phone]   [place], [place], [place] [phone], Ext. [phone] Mondays & Thursdays: 7-9 AM.  First 15 patients are seen on a first come, first serve basis.    [place] Providers:  Organization         Address  Phone   Notes  [facility] [address], [place] [phone] Also accepts self-pay patients.  [facility] [place], [place]  [phone]   [place], [address], [place] [phone]   [facility] [address], [place] [phone]   Lucianne Lei [address], [place]   [phone] Only accepts [place] Access [place] patients after they have their name applied to their card.   Self-Pay (no insurance) in [facility]:  Organization         Address  Phone   Notes  Sickle Cell Patients, [facility] [place] [phone]   [facility] [place] [phone]   [facility] [place]  [place], [place], [place] [phone]   [facility]/Dr. Osei-Bonsu  [address], [place] or [address], [place] [phone] Phone number for both [place] and [place] locations is the same.  [facility] [address] [place] [phone]   [place], [place] or [address] [phone]   [facility] [address], [place] [phone], phone; [phone], fax Sees patients 1st and 3rd Saturday of every month.  Must not qualify for public or private insurance (i.e. Medicaid, Medicare, [place] Health Choice, Veterans' Benefits)  Household income should be no more than 200% of the poverty level The clinic cannot treat you if you are pregnant or think you are pregnant  Sexually transmitted diseases are not treated at the clinic.    Dental Care: Organization         Address  Phone  Notes  [facility] Department of [facility] [place] [phone] Accepts children up to age 70 who are enrolled in [place] or [place]; pregnant women with a Medicaid card; and children who have applied for Medicaid or [place] Health Choice, but were declined, whose parents can pay a reduced fee at time of service.  [facility] Department of [facility]  [address], [place] [phone] Accepts children up to age 29 who are enrolled in [place] or [place]; pregnant women with a Medicaid card; and children who have applied for Medicaid or  Health Choice, but were declined, whose parents can pay a reduced fee at time of service.  [place] Adult Dental Access PROGRAM  [place] [phone] Patients are seen by appointment only. Walk-ins are not accepted. [place] will see patients 31 years of age and older. Monday - Tuesday (8am-5pm) Most Wednesdays (8:30-5pm) $30 per visit, cash only  [facility] Adult Dental Access PROGRAM  [address], [facility] [phone] Patients are seen by appointment only. Walk-ins are not accepted. [place] will see patients 55 years of age and older. One Wednesday Evening (Monthly: Volunteer Based).  $30 per visit, cash only   [place]  [phone] for adults; Children under age 67, call Graduate Pediatric Dentistry at [phone]. Children aged 41-14, please call [phone] to request a pediatric application.  Dental services are provided in all areas of dental care including fillings, crowns and bridges, complete and partial dentures, implants, gum treatment, root canals, and extractions. Preventive care is also provided. Treatment is provided to both adults and children.  Patients are selected via a lottery and there is often a waiting list.   [facility] [address] [place]  [phone] [email or web address]   [facility] [address] [place], [place] [phone], Ext. [phone] Second and Fourth Thursday of each month, opens at 6:30 AM; Clinic ends at 9 AM.  Patients are seen on a first-come first-served basis, and a limited number are seen during each clinic.   [facility]  [address] Hillard Danker [place], [place] [phone]   Eligibility Requirements You must have lived in [place], [place], or [place] counties for at least the last three months.   You cannot be eligible for state or federal sponsored [employer], including [employer], [place], or [employer].   You generally cannot be eligible for healthcare insurance through your employer.    How to apply: Eligibility screenings are held every Tuesday and Wednesday afternoon from 1:00 pm until 4:00 pm. You do not need an appointment for the interview!  [facility] [address], [place], [place]   [place]  [place] Department  [place]  [phone]    Behavioral Health Resources in the Community: Intensive Outpatient Programs Organization         Address  Phone  Notes  [place]. [address] [place],  [place] [phone]   [facility] [address], [place], [place]   ADS: Alcohol & Drug Svcs [address] [place], [place]   [place] [address],  [place], [place] or [phone]   Substance Abuse Resources Organization         Address  Phone  Notes  Alcohol and Drug Services  [phone]   [place]  [phone]   [facility] [place]   [place]a [place]ester  [phone]   Residential & Outpatient Substance Abuse Program  [phone]   Psychological Services Organization         Address  Phone  Notes  [facility] [place]  [place]  [phone]   [place] [address] [place] or [phone]    Mobile Crisis Teams Organization         Address  Phone  Notes  [facility], Mobile Crisis Care Unit  [phone]   [facility]  [address] [place], [place]   [place] [address] [place] [phone]    Self-Help/Support Groups Organization         Address  Phone             Notes  [place]. of [place] - variety of support groups  [place] Call for more information  Narcotics Anonymous (NA), [facility] [address], [employer] [place]  2 meetings at this location   Special educational needs teacher         Address  Phone  Notes  [facility] [place],    [place]  [phone]   [facility]  [address] [place] [phone], [place], [place]   [place], [place] [phone] Admissions: 8am-3pm M-F  Incentives Substance [place] [address],    [place], [place]   [facility] [place]  [address] [place], [place]   [facility] [address],  [place], [place]   [facility] - Intensive Outpatient [address]een [address], [place], [place]   [facility] ([place].,  [place], [place] [phone] or [phone]   [facility] (RTS) [address], [place], [place] Accepts Medicaid  [facility] [place] [address]  [place] [phone] Substance Abuse/Addiction Treatment   [facility] Organization         Address  Phone  Notes  [facility]  [phone]   Domenic Schwab, PhD [address] [place], [place]   [phone] or [phone]   [place], [place] [phone]   [address], [place], [place] [phone] Insurance/Medicaid/sponsorship through [facility] [address], [address] [facility]                                    [place], [place] [phone] [place] [address], [place] [phone]    Dr. Adele Schilder  [phone]   [facility] Dept. 1) [address],  2) [place] 3)  [place] [address], [place] [phone]  [phone]   [place] [phone] or [phone] (After Hours)

## 2014-08-14 NOTE — Care Management (Signed)
ED CM consulted by [name] in FT concerning patient not been able to afford medications. 3 ED visits in the past 6 months, for similar complaints. Reviewed record confirmed information. Met with patient in FT, he stated he is homeless living in [place], and does not have any income.  Patient  States, he has not filled previous prescriptions due to lack of funds.  Discussed the [facility] in order to establish f/u care, patient agreeable to disposition plan. Offered to assist with arranging f/u care at clinic, patient agreeable. Appt schedule for Fri 12/4 at 2p [facility]. Explained that he can go to [facility] pharmacy immediately to fill his prescriptions, provided the dates and times of operation. Patient verbalized understanding and appreciation for the assistance. Updated H. Muthersbaugh PA-C on disposition plan she was agreeable to plan. No further ED CM needs identified.

## 2014-08-25 ENCOUNTER — Ambulatory Visit: Payer: No Typology Code available for payment source | Attending: Internal Medicine | Admitting: Internal Medicine

## 2014-08-25 ENCOUNTER — Encounter: Payer: Self-pay | Admitting: Internal Medicine

## 2014-08-25 VITALS — BP 124/85 | HR 95 | Temp 98.3°F | Resp 16 | Ht 71.0 in | Wt 185.0 lb

## 2014-08-25 DIAGNOSIS — I10 Essential (primary) hypertension: Secondary | ICD-10-CM | POA: Insufficient documentation

## 2014-08-25 DIAGNOSIS — F17200 Nicotine dependence, unspecified, uncomplicated: Secondary | ICD-10-CM

## 2014-08-25 DIAGNOSIS — F17210 Nicotine dependence, cigarettes, uncomplicated: Secondary | ICD-10-CM | POA: Insufficient documentation

## 2014-08-25 DIAGNOSIS — Z720 Tobacco use: Secondary | ICD-10-CM

## 2014-08-25 DIAGNOSIS — M25551 Pain in right hip: Secondary | ICD-10-CM | POA: Diagnosis not present

## 2014-08-25 DIAGNOSIS — Z79899 Other long term (current) drug therapy: Secondary | ICD-10-CM | POA: Diagnosis not present

## 2014-08-25 DIAGNOSIS — Z888 Allergy status to other drugs, medicaments and biological substances status: Secondary | ICD-10-CM | POA: Diagnosis not present

## 2014-08-25 MED ORDER — CYCLOBENZAPRINE HCL 10 MG PO TABS: 10.0000 mg | ORAL_TABLET | Freq: Three times a day (TID) | ORAL | Status: DC | PRN

## 2014-08-25 MED ORDER — MELOXICAM 15 MG PO TABS: 15.0000 mg | ORAL_TABLET | Freq: Every day | ORAL | Status: DC

## 2014-08-25 NOTE — Progress Notes (Signed)
Patient ID: Ryan Medina, male   DOB: Feb 16, 1961, 53 y.o.   MRN: [id number]  YBO:[id number]  IDP:[id number]  DOB - 07/02/1961  CC:  Chief Complaint  Patient presents with  . Establish Care       HPI: Ryan Medina is a 53 y.o. male with a h/o HTN who presents to the clinic today complaining of constant, 10/10, "aching", R sided lower back/buttock pain that radiates down his lateral right thigh for 1 month. Pt states that the pain is worse at night or after immobility for long periods of time. The pain gets so bad that it wakes him up from sleep. The pain begins in the right lateral hip/ posterior buttock and extends to his right knee and into the lateral calf. Pt states that it may be due to when he was moving heavy furniture 1 month ago, states he hit it on something. H/o of bulging disc in his back that required surgery. He sees minor relief with walking or standing on his leg. He has been to the ER 3 times in the past 2 months for similar symptoms.  He has been prescribed percocet, tramadol, flexeril, prednisone, and naproxen by the ER.  He has all written prescriptions with him today due to not being able to get them filled.  Previously been seen at the [facility] for this pain but was told there was nothing they could do.  Denies any numbness, tingling, weakness, no urinary or bowel incontinence, hematuria, dysuria, flank pain, fever, nausea, vomiting, abd pain, warmth or color change to the area of right hip. No h/o cancer or unexplained weight loss.  Has had blood work at [facility] last month and everything came back normal.     Allergies  Allergen Reactions  . Lisinopril Swelling   Past Medical History  Diagnosis Date  . Hypertension   . Ruptured lumbar disc   . Back pain, chronic    Current Outpatient Prescriptions on File Prior to Visit  Medication Sig Dispense Refill  . cyclobenzaprine (FLEXERIL) 10 MG tablet Take 1 tablet (10 mg total) by mouth 3  (three) times daily as needed for muscle spasms. 15 tablet 0  . esomeprazole (NEXIUM) 40 MG capsule Take 40 mg by mouth daily at 12 noon.    . hydrochlorothiazide (HYDRODIURIL) 25 MG tablet Take 25 mg by mouth daily.    . predniSONE (DELTASONE) 10 MG tablet Take 2 tablets (20 mg total) by mouth daily. 14 tablet 0  . naproxen sodium (ANAPROX) 220 MG tablet Take 440 mg by mouth daily as needed (pain).    . traMADol (ULTRAM) 50 MG tablet Take 1 tablet (50 mg total) by mouth every 6 (six) hours as needed. (Patient not taking: Reported on 08/25/2014) 40 tablet 0   No current facility-administered medications on file prior to visit.   Family History  Problem Relation Age of Onset  . Diabetes Mother   . Diabetes Sister   . Diabetes Brother    History   Social History  . Marital Status: Single    Spouse Name: N/A    Number of Children: N/A  . Years of Education: N/A   Occupational History  . Not on file.   Social History Main Topics  . Smoking status: Current Some Day Smoker  . Smokeless tobacco: Not on file  . Alcohol Use: No  . Drug Use: No  . Sexual Activity: Not on file   Other Topics Concern  . Not  on file   Social History Narrative    Review of Systems: See HPI.    Objective:   Filed Vitals:   08/25/14 1410  BP: 124/85  Pulse: 95  Temp: 98.3 F (36.8 C)  Resp: 16    Physical Exam: Constitutional: Patient appears well-developed and well-nourished. No distress. HENT: Normocephalic, atraumatic, External right and left ear normal. Oropharynx is clear and moist.  Eyes: Conjunctivae and EOM are normal. PERRLA, no scleral icterus. Neck: Normal ROM. Neck supple. No JVD. No tracheal deviation. No thyromegaly. CVS: RRR, S1/S2 +, no murmurs, no gallops, no carotid bruit.  Pulmonary: Effort and breath sounds normal, no stridor, rhonchi, wheezes, rales.  Abdominal: Soft. BS +, no distension, tenderness, rebound or guarding.  Musculoskeletal: Normal range of motion. No  edema and no tenderness.  Lymphadenopathy: No lymphadenopathy noted, cervical Neuro: Alert. Normal reflexes, muscle tone coordination. No cranial nerve deficit. Skin: Skin is warm and dry. No rash noted. Not diaphoretic. No erythema. No pallor. Psychiatric: Normal mood and affect. Behavior, judgment, thought content normal.  No results found for: WBC, HGB, HCT, MCV, PLT No results found for: CREATININE, BUN, NA, K, CL, CO2  No results found for: HGBA1C Lipid Panel  No results found for: CHOL, TRIG, HDL, CHOLHDL, VLDL, LDLCALC     Assessment and plan:   Ryan Medina was seen today for establish care.  Diagnoses and associated orders for this visit:  Essential hypertension Patient blood pressure is stable and may continue on current medication.  Education on diet, exercise, and modifiable risk factors discussed. Will obtain appropriate labs as needed. Will follow up in 3-6 months.   Right hip pain - meloxicam (MOBIC) 15 MG tablet; Take 1 tablet (15 mg total) by mouth daily. - cyclobenzaprine (FLEXERIL) 10 MG tablet; Take 1 tablet (10 mg total) by mouth 3 (three) times daily as needed for muscle spasms.  Smoking Smoking cessation discussed for 3 minutes, patient is not willing to quit at this time. Will continue to assess on each visit. Discussed increased risk for diseases such as cancer, heart disease, and stroke.     Return in about 3 months (around 11/24/2014) for Hypertension, will place order for Sports Medicine.  The patient was given clear instructions to return to medical center if symptoms don't improve, worsen or new problems develop. The patient verbalized understanding.  Chari Manning, NP-C [facility] [phone] 08/25/2014, 2:24 PM

## 2014-08-25 NOTE — Progress Notes (Signed)
Pt id here to establish care. Pt has a history of HTN. Pt reports have pain in his right hip for 3 months.

## 2014-08-25 NOTE — Patient Instructions (Signed)
Hip Pain Your hip is the joint between your upper legs and your lower pelvis. The bones, cartilage, tendons, and muscles of your hip joint perform a lot of work each day supporting your body weight and allowing you to move around. Hip pain can range from a minor ache to severe pain in one or both of your hips. Pain may be felt on the inside of the hip joint near the groin, or the outside near the buttocks and upper thigh. You may have swelling or stiffness as well.  HOME CARE INSTRUCTIONS   Take medicines only as directed by your health care provider.  Apply ice to the injured area:  Put ice in a plastic bag.  Place a towel between your skin and the bag.  Leave the ice on for 15-20 minutes at a time, 3-4 times a day.  Keep your leg raised (elevated) when possible to lessen swelling.  Avoid activities that cause pain.  Follow specific exercises as directed by your health care provider.  Sleep with a pillow between your legs on your most comfortable side.  Record how often you have hip pain, the location of the pain, and what it feels like. SEEK MEDICAL CARE IF:   You are unable to put weight on your leg.  Your hip is red or swollen or very tender to touch.  Your pain or swelling continues or worsens after 1 week.  You have increasing difficulty walking.  You have a fever. SEEK IMMEDIATE MEDICAL CARE IF:   You have fallen.  You have a sudden increase in pain and swelling in your hip. MAKE SURE YOU:   Understand these instructions.  Will watch your condition.  Will get help right away if you are not doing well or get worse. Document Released: 02/26/2010 Document Revised: 01/23/2014 Document Reviewed: 05/05/2013 [employer] Patient Information 2015 [employer]. This information is not intended to replace advice given to you by your health care provider. Make sure you discuss any questions you have with your health care provider.  

## 2014-10-20 ENCOUNTER — Encounter (HOSPITAL_COMMUNITY): Payer: Self-pay | Admitting: Physical Medicine and Rehabilitation

## 2014-10-20 ENCOUNTER — Emergency Department (HOSPITAL_COMMUNITY)
Admission: EM | Admit: 2014-10-20 | Discharge: 2014-10-20 | Disposition: A | Discharge status: Home / Self Care | Payer: No Typology Code available for payment source | Attending: Emergency Medicine | Admitting: Emergency Medicine

## 2014-10-20 DIAGNOSIS — Z791 Long term (current) use of non-steroidal anti-inflammatories (NSAID): Secondary | ICD-10-CM | POA: Insufficient documentation

## 2014-10-20 DIAGNOSIS — Z79899 Other long term (current) drug therapy: Secondary | ICD-10-CM | POA: Insufficient documentation

## 2014-10-20 DIAGNOSIS — Z720 Tobacco use: Secondary | ICD-10-CM | POA: Insufficient documentation

## 2014-10-20 DIAGNOSIS — M5441 Lumbago with sciatica, right side: Secondary | ICD-10-CM | POA: Insufficient documentation

## 2014-10-20 DIAGNOSIS — M79606 Pain in leg, unspecified: Secondary | ICD-10-CM

## 2014-10-20 DIAGNOSIS — I10 Essential (primary) hypertension: Secondary | ICD-10-CM | POA: Insufficient documentation

## 2014-10-20 DIAGNOSIS — M549 Dorsalgia, unspecified: Secondary | ICD-10-CM

## 2014-10-20 DIAGNOSIS — G8929 Other chronic pain: Secondary | ICD-10-CM | POA: Insufficient documentation

## 2014-10-20 MED ORDER — PREDNISONE 20 MG PO TABS: ORAL_TABLET | ORAL | Status: DC

## 2014-10-20 MED ORDER — TRAMADOL HCL 50 MG PO TABS: 50.0000 mg | ORAL_TABLET | Freq: Four times a day (QID) | ORAL | Status: DC | PRN

## 2014-10-20 NOTE — Progress Notes (Signed)
*  Preliminary Results* Right lower extremity venous duplex completed. Right lower extremity is negative for deep vein thrombosis. There is no evidence of right Baker's cyst.  10/20/2014 1:25 PM  Maudry Mayhew, RVT, RDCS, RDMS

## 2014-10-20 NOTE — ED Provider Notes (Signed)
CSN: [id number]     Arrival date & time 10/20/14  1018 History  This chart was scribed for non-physician practitioner, Jamse Mead, PA-C, working with Orpah Greek, MD, by Stephania Fragmin, ED Scribe. This patient was seen in room TR08C/TR08C and the patient's care was started at 12:50 PM.     Chief Complaint  Patient presents with  . Leg Pain  . Hip Pain   The history is provided by the patient. No language interpreter was used.    HPI Comments: Ryan Medina is a 54 y.o. male with a history of hypertension, ruptured lumbar disc, and chronic back pain who presents to the Emergency Department complaining of constant, dull, aching right calf pain radiating upwards to his right hip, with onset 4 months ago, although he states his pain originally radiated from his right hip downwards. Patient reports his right leg went completely numb for 15 minutes when he was standing 4 days ago; it resolved after walking. He denies recent falls, collapse, or injury. Patient has been having muscle spasms, for which he has been taking Flexeril with moderate relief. He has also taken ibuprofen that is no longer effective. Patient had a back surgery done "some years" ago. He denies recent travel. Patient denies a history of sciatica or sciatic pain. He denies leg swelling, chest pain, SOB, skin discoloration, or bladder or bowel incontinence, fall, injuries.  PCP none  Past Medical History  Diagnosis Date  . Hypertension   . Ruptured lumbar disc   . Back pain, chronic    Past Surgical History  Procedure Laterality Date  . Back surgery    . Abdominal surgery     Family History  Problem Relation Age of Onset  . Diabetes Mother   . Diabetes Sister   . Diabetes Brother    History  Substance Use Topics  . Smoking status: Current Some Day Smoker    Types: Cigarettes  . Smokeless tobacco: Not on file  . Alcohol Use: No    Review of Systems  Respiratory: Negative for shortness of breath.    Cardiovascular: Negative for chest pain and leg swelling.  Genitourinary: Negative for enuresis.  Musculoskeletal: Positive for myalgias and arthralgias.  Skin: Negative for color change.      Allergies  Lisinopril  Home Medications   Prior to Admission medications   Medication Sig Start Date End Date Taking? Authorizing Provider  amLODipine (NORVASC) 5 MG tablet Take 5 mg by mouth daily.    Historical Provider, MD  cyclobenzaprine (FLEXERIL) 10 MG tablet Take 1 tablet (10 mg total) by mouth 3 (three) times daily as needed for muscle spasms. 08/25/14   Lance Bosch, NP  esomeprazole (NEXIUM) 40 MG capsule Take 40 mg by mouth daily at 12 noon.    Historical Provider, MD  hydrochlorothiazide (HYDRODIURIL) 25 MG tablet Take 25 mg by mouth daily.    Historical Provider, MD  meloxicam (MOBIC) 15 MG tablet Take 1 tablet (15 mg total) by mouth daily. 08/25/14   Lance Bosch, NP  naproxen sodium (ANAPROX) 220 MG tablet Take 440 mg by mouth daily as needed (pain).    Historical Provider, MD  predniSONE (DELTASONE) 20 MG tablet 3 tabs po day one, then 2 tabs daily x 4 days 10/20/14   Undine Nealis, PA-C  traMADol (ULTRAM) 50 MG tablet Take 1 tablet (50 mg total) by mouth every 6 (six) hours as needed. 10/20/14   Palin Tristan, PA-C   BP 130/95 mmHg  Pulse 82  Temp(Src) 97.9 F (36.6 C) (Oral)  Resp 18  Ht 5\' 11"  (1.803 m)  Wt 197 lb (89.359 kg)  BMI 27.49 kg/m2  SpO2 99% Physical Exam  Constitutional: He is oriented to person, place, and time. He appears well-developed and well-nourished. No distress.  HENT:  Head: Normocephalic and atraumatic.  Mouth/Throat: Oropharynx is clear and moist. No oropharyngeal exudate.  Eyes: Conjunctivae and EOM are normal. Right eye exhibits no discharge. Left eye exhibits no discharge.  Neck: Normal range of motion. Neck supple. No tracheal deviation present.  Cardiovascular: Normal rate, regular rhythm and normal heart sounds.  Exam reveals no  friction rub.   No murmur heard. Pulses:      Radial pulses are 2+ on the right side, and 2+ on the left side.       Dorsalis pedis pulses are 2+ on the right side, and 2+ on the left side.  Pulmonary/Chest: Effort normal and breath sounds normal. No respiratory distress. He has no wheezes. He has no rales.  Musculoskeletal: Normal range of motion. He exhibits tenderness.       Lumbar back: He exhibits tenderness. He exhibits normal range of motion, no bony tenderness, no swelling, no edema, no deformity and no laceration.       Back:  Negative swelling, erythema, inflammation, lesions, sores, deformities, bulging identified to the spine. Mild discomfort upon palpation to the lumbosacral spine and right paravertebral region.  Mild discomfort upon palpation to the right calf. Negative swelling, erythema, inflammation, lesions, sores, deformities, red streaks. Full ROM to upper and lower extremities without difficulty noted, negative ataxia noted.  Lymphadenopathy:    He has no cervical adenopathy.  Neurological: He is alert and oriented to person, place, and time. No cranial nerve deficit. He exhibits normal muscle tone. Coordination normal.  Cranial nerves III-XII grossly intact Strength 5+/5+ to upper and lower extremities bilaterally with resistance applied, equal distribution noted Equal grip strength Sensation intact with differentiation to sharp and dull touch Negative saddle paresthesias bilaterally Negative arm drift Fine motor skills intact Gait proper, proper balance - negative sway, negative drift, negative step-offs  Skin: Skin is warm and dry. No rash noted. He is not diaphoretic. No erythema.  Psychiatric: He has a normal mood and affect. His behavior is normal. Thought content normal.  Nursing note and vitals reviewed.   ED Course  Procedures (including critical care time)  DIAGNOSTIC STUDIES: Oxygen Saturation is 99% on room air, normal by my interpretation.     COORDINATION OF CARE: 12:56 PM - Discussed treatment plan with pt at bedside which includes [place] to screen for DVT, and pt agreed to plan.  *Preliminary Results* Right lower extremity venous duplex completed. Right lower extremity is negative for deep vein thrombosis. There is no evidence of right Baker's cyst.  10/20/2014 1:25 PM  Maudry Mayhew, RVT, RDCS, RDMS    Labs Review Labs Reviewed - No data to display  Imaging Review No results found.   EKG Interpretation None      MDM   Final diagnoses:  Midline low back pain with right-sided sciatica  Chronic back pain    Medications - No data to display   Filed Vitals:   10/20/14 1106  BP: 130/95  Pulse: 82  Temp: 97.9 F (36.6 C)  TempSrc: Oral  Resp: 18  Height: 5\' 11"  (1.803 m)  Weight: 197 lb (89.359 kg)  SpO2: 99%   I personally performed the services described in this documentation, which  was scribed in my presence. The recorded information has been reviewed and is accurate.  This provider reviewed the patient's chart. Patient is been seen and assessed in ED setting in the past regarding back pain and sciatica. Doppler of right lower extremity negative for DVT, no evidence of Baker cysts. Negative focal neurological deficits. Pulses palpable and strong. Negative signs of cellulitic infection. Full range of motion to upper lower extremities bilaterally without difficulty or ataxia. Gait proper with-negative step-offs or sway. Patient has history of chronic back pain and ruptured lumbar disc. Discussed case in great detail with attending physician, Dr. Betsey Holiday, as per physician recommended patient to be treated for radiculopathy pain. No emergent imaging needed at this time. Doubt cauda equina. Doubt epidural abscess. Patient stable, afebrile. Patient not septic appearing. Discharged patient. Suspicion to be radiculopathy pain, possible beginnings of sciatica. Discharged patient with prednisone and small dose of  pain medications-discussed course, precautions, disposal technique. Symptoms have been ongoing for approximately 3-4 months-recommended patient to be followed by PCP. Discussed with patient to rest and stay hydrated. Referred to PCP and orthopedics. Discussed with patient to closely monitor symptoms and if symptoms are to worsen or change to report back to the ED - strict return instructions given.  Patient agreed to plan of care, understood, all questions answered.   Jamse Mead, PA-C 10/20/14 [place], PA-C 10/20/14 [place], MD 10/20/14 1530

## 2014-10-20 NOTE — Discharge Instructions (Signed)
Please call your doctor for a followup appointment within 24-48 hours. When you talk to your doctor please let them know that you were seen in the emergency department and have them acquire all of your records so that they can discuss the findings with you and formulate a treatment plan to fully care for your new and ongoing problems. This follow-up to primary care provider Please follow-up with orthopedics Please avoid any physical or strenuous activity Please rest and stay hydrated Please take medications as prescribed and on a full stomach Please take medications as prescribed - while on pain medications there is to be no drinking alcohol, driving, operating any heavy machinery. If extra please dispose in a proper manner. Please do not take any extra Tylenol with this medication for this can lead to Tylenol overdose and liver issues.  Please apply heat and massage Please avoid any heavy lifting Please continue to monitor symptoms closely and if symptoms are to worsen or change (fever greater than 101, chills, sweating, nausea, vomiting, chest pain, shortness of breathe, difficulty breathing, weakness, numbness, tingling, worsening or changes to pain pattern, inability to control urine or bowel movements, blood in the stools, black tarry stools, fall, injury, dizziness) please report back to the Emergency Department immediately.   Sciatica Sciatica is pain, weakness, numbness, or tingling along the path of the sciatic nerve. The nerve starts in the lower back and runs down the back of each leg. The nerve controls the muscles in the lower leg and in the back of the knee, while also providing sensation to the back of the thigh, lower leg, and the sole of your foot. Sciatica is a symptom of another medical condition. For instance, nerve damage or certain conditions, such as a herniated disk or bone spur on the spine, pinch or put pressure on the sciatic nerve. This causes the pain, weakness, or other  sensations normally associated with sciatica. Generally, sciatica only affects one side of the body. CAUSES   Herniated or slipped disc.  Degenerative disk disease.  A pain disorder involving the narrow muscle in the buttocks (piriformis syndrome).  Pelvic injury or fracture.  Pregnancy.  Tumor (rare). SYMPTOMS  Symptoms can vary from mild to very severe. The symptoms usually travel from the low back to the buttocks and down the back of the leg. Symptoms can include:  Mild tingling or dull aches in the lower back, leg, or hip.  Numbness in the back of the calf or sole of the foot.  Burning sensations in the lower back, leg, or hip.  Sharp pains in the lower back, leg, or hip.  Leg weakness.  Severe back pain inhibiting movement. These symptoms may get worse with coughing, sneezing, laughing, or prolonged sitting or standing. Also, being overweight may worsen symptoms. DIAGNOSIS  Your caregiver will perform a physical exam to look for common symptoms of sciatica. He or she may ask you to do certain movements or activities that would trigger sciatic nerve pain. Other tests may be performed to find the cause of the sciatica. These may include:  Blood tests.  X-rays.  Imaging tests, such as an MRI or CT scan. TREATMENT  Treatment is directed at the cause of the sciatic pain. Sometimes, treatment is not necessary and the pain and discomfort goes away on its own. If treatment is needed, your caregiver may suggest:  Over-the-counter medicines to relieve pain.  Prescription medicines, such as anti-inflammatory medicine, muscle relaxants, or narcotics.  Applying heat or ice to  the painful area.  Steroid injections to lessen pain, irritation, and inflammation around the nerve.  Reducing activity during periods of pain.  Exercising and stretching to strengthen your abdomen and improve flexibility of your spine. Your caregiver may suggest losing weight if the extra weight makes  the back pain worse.  Physical therapy.  Surgery to eliminate what is pressing or pinching the nerve, such as a bone spur or part of a herniated disk. HOME CARE INSTRUCTIONS   Only take over-the-counter or prescription medicines for pain or discomfort as directed by your caregiver.  Apply ice to the affected area for 20 minutes, 3-4 times a day for the first 48-72 hours. Then try heat in the same way.  Exercise, stretch, or perform your usual activities if these do not aggravate your pain.  Attend physical therapy sessions as directed by your caregiver.  Keep all follow-up appointments as directed by your caregiver.  Do not wear high heels or shoes that do not provide proper support.  Check your mattress to see if it is too soft. A firm mattress may lessen your pain and discomfort. SEEK IMMEDIATE MEDICAL CARE IF:   You lose control of your bowel or bladder (incontinence).  You have increasing weakness in the lower back, pelvis, buttocks, or legs.  You have redness or swelling of your back.  You have a burning sensation when you urinate.  You have pain that gets worse when you lie down or awakens you at night.  Your pain is worse than you have experienced in the past.  Your pain is lasting longer than 4 weeks.  You are suddenly losing weight without reason. MAKE SURE YOU:  Understand these instructions.  Will watch your condition.  Will get help right away if you are not doing well or get worse. Document Released: 09/02/2001 Document Revised: 03/09/2012 Document Reviewed: 01/18/2012 [facility] Patient Information 2015 [place], [place]. This information is not intended to replace advice given to you by your health care provider. Make sure you discuss any questions you have with your health care provider. Chronic Back Pain  When back pain lasts longer than 3 months, it is called chronic back pain.People with chronic back pain often go through certain periods that are more  intense (flare-ups).  CAUSES Chronic back pain can be caused by wear and tear (degeneration) on different structures in your back. These structures include:  The bones of your spine (vertebrae) and the joints surrounding your spinal cord and nerve roots (facets).  The strong, fibrous tissues that connect your vertebrae (ligaments). Degeneration of these structures may result in pressure on your nerves. This can lead to constant pain. HOME CARE INSTRUCTIONS  Avoid bending, heavy lifting, prolonged sitting, and activities which make the problem worse.  Take brief periods of rest throughout the day to reduce your pain. Lying down or standing usually is better than sitting while you are resting.  Take over-the-counter or prescription medicines only as directed by your caregiver. SEEK IMMEDIATE MEDICAL CARE IF:   You have weakness or numbness in one of your legs or feet.  You have trouble controlling your bladder or bowels.  You have nausea, vomiting, abdominal pain, shortness of breath, or fainting. Document Released: 10/16/2004 Document Revised: 12/01/2011 Document Reviewed: 08/23/2011 [facility] Patient Information 2015 [place], [place]. This information is not intended to replace advice given to you by your health care provider. Make sure you discuss any questions you have with your health care provider.

## 2014-10-20 NOTE — ED Notes (Signed)
Pt returned from vascular

## 2014-10-20 NOTE — ED Notes (Signed)
Pt states R hip pain radiating down R leg. Ongoing for several months, states pain became worse after moving heavy boxes. 7/10 pain upon arrival. Ambulatory to triage. NAD.

## 2015-01-14 ENCOUNTER — Observation Stay (HOSPITAL_COMMUNITY)
Admission: EM | Admit: 2015-01-14 | Discharge: 2015-01-14 | Disposition: A | Discharge status: Home / Self Care | Payer: Self-pay | Attending: Family Medicine | Admitting: Family Medicine

## 2015-01-14 ENCOUNTER — Encounter (HOSPITAL_COMMUNITY): Payer: Self-pay | Admitting: *Deleted

## 2015-01-14 DIAGNOSIS — F17210 Nicotine dependence, cigarettes, uncomplicated: Secondary | ICD-10-CM | POA: Insufficient documentation

## 2015-01-14 DIAGNOSIS — Z833 Family history of diabetes mellitus: Secondary | ICD-10-CM | POA: Insufficient documentation

## 2015-01-14 DIAGNOSIS — R5383 Other fatigue: Secondary | ICD-10-CM | POA: Insufficient documentation

## 2015-01-14 DIAGNOSIS — R739 Hyperglycemia, unspecified: Secondary | ICD-10-CM | POA: Diagnosis present

## 2015-01-14 DIAGNOSIS — E871 Hypo-osmolality and hyponatremia: Secondary | ICD-10-CM | POA: Insufficient documentation

## 2015-01-14 DIAGNOSIS — N179 Acute kidney failure, unspecified: Principal | ICD-10-CM | POA: Insufficient documentation

## 2015-01-14 DIAGNOSIS — I10 Essential (primary) hypertension: Secondary | ICD-10-CM | POA: Insufficient documentation

## 2015-01-14 LAB — I-STAT CHEM 8, ED
BUN: 22 mg/dL (ref 6–23)
CALCIUM ION: 1.2 mmol/L (ref 1.12–1.23)
CREATININE: 1.3 mg/dL (ref 0.50–1.35)
Chloride: 104 mmol/L (ref 96–112)
GLUCOSE: 115 mg/dL — AB (ref 70–99)
HCT: 37 % — ABNORMAL LOW (ref 39.0–52.0)
HEMOGLOBIN: 12.6 g/dL — AB (ref 13.0–17.0)
Potassium: 4.7 mmol/L (ref 3.5–5.1)
SODIUM: 140 mmol/L (ref 135–145)
TCO2: 24 mmol/L (ref 0–100)

## 2015-01-14 LAB — CBC
HEMATOCRIT: 39.8 % (ref 39.0–52.0)
Hemoglobin: 13.4 g/dL (ref 13.0–17.0)
MCH: 29.3 pg (ref 26.0–34.0)
MCHC: 33.7 g/dL (ref 30.0–36.0)
MCV: 87.1 fL (ref 78.0–100.0)
Platelets: 285 10*3/uL (ref 150–400)
RBC: 4.57 MIL/uL (ref 4.22–5.81)
RDW: 12.3 % (ref 11.5–15.5)
WBC: 5.3 10*3/uL (ref 4.0–10.5)

## 2015-01-14 LAB — URINALYSIS, ROUTINE W REFLEX MICROSCOPIC
BILIRUBIN URINE: NEGATIVE
Glucose, UA: >1000 mg/dL — AB
HGB URINE DIPSTICK: NEGATIVE
Ketones, ur: NEGATIVE mg/dL
Leukocytes, UA: NEGATIVE
NITRITE: NEGATIVE
PROTEIN: NEGATIVE mg/dL
Specific Gravity, Urine: 1.037 — ABNORMAL HIGH (ref 1.005–1.030)
UROBILINOGEN UA: 0.2 mg/dL (ref 0.0–1.0)
pH: 5.5 (ref 5.0–8.0)

## 2015-01-14 LAB — URINE MICROSCOPIC-ADD ON

## 2015-01-14 LAB — COMPREHENSIVE METABOLIC PANEL
ALBUMIN: 3.3 g/dL — AB (ref 3.5–5.2)
ALT: 30 U/L (ref 0–53)
AST: 25 U/L (ref 0–37)
Alkaline Phosphatase: 85 U/L (ref 39–117)
Anion gap: 9 (ref 5–15)
BUN: 18 mg/dL (ref 6–23)
CALCIUM: 9.2 mg/dL (ref 8.4–10.5)
CO2: 27 mmol/L (ref 19–32)
Chloride: 90 mmol/L — ABNORMAL LOW (ref 96–112)
Creatinine, Ser: 1.7 mg/dL — ABNORMAL HIGH (ref 0.50–1.35)
GFR calc Af Amer: 51 mL/min — ABNORMAL LOW (ref >90)
GFR, EST NON AFRICAN AMERICAN: 44 mL/min — AB (ref >90)
Glucose, Bld: 644 mg/dL (ref 70–99)
Potassium: 4.6 mmol/L (ref 3.5–5.1)
SODIUM: 126 mmol/L — AB (ref 135–145)
Total Bilirubin: 0.6 mg/dL (ref 0.3–1.2)
Total Protein: 6.6 g/dL (ref 6.0–8.3)

## 2015-01-14 LAB — CBG MONITORING, ED
GLUCOSE-CAPILLARY: 252 mg/dL — AB (ref 70–99)
GLUCOSE-CAPILLARY: 257 mg/dL — AB (ref 70–99)
Glucose-Capillary: 97 mg/dL (ref 70–99)
Glucose-Capillary: 115 mg/dL — ABNORMAL HIGH (ref 70–99)

## 2015-01-14 MED ORDER — METFORMIN HCL 500 MG PO TABS: 500.0000 mg | ORAL_TABLET | Freq: Two times a day (BID) | ORAL | Status: DC

## 2015-01-14 MED ORDER — INSULIN ASPART 100 UNIT/ML ~~LOC~~ SOLN
15.0000 [IU] | Freq: Once | SUBCUTANEOUS | Status: AC
  Administered 2015-01-14: 15 [IU] via INTRAVENOUS
  Filled 2015-01-14: qty 1

## 2015-01-14 MED ORDER — SODIUM CHLORIDE 0.9 % IV SOLN: 1000.0000 mL | INTRAVENOUS | Status: DC

## 2015-01-14 MED ORDER — SODIUM CHLORIDE 0.9 % IV SOLN
1000.0000 mL | Freq: Once | INTRAVENOUS | Status: AC
  Administered 2015-01-14: 1000 mL via INTRAVENOUS

## 2015-01-14 MED ORDER — METFORMIN HCL 500 MG PO TABS
500.0000 mg | ORAL_TABLET | Freq: Once | ORAL | Status: AC
  Administered 2015-01-14: 500 mg via ORAL
  Filled 2015-01-14: qty 1

## 2015-01-14 NOTE — ED Notes (Signed)
CBG 252 

## 2015-01-14 NOTE — ED Notes (Signed)
Critical lab given to [place], PA-C.

## 2015-01-14 NOTE — ED Notes (Signed)
CBG 115. PA [place] notified.

## 2015-01-14 NOTE — ED Notes (Signed)
MD at bedside. 

## 2015-01-14 NOTE — ED Provider Notes (Signed)
CSN: [id number]     Arrival date & time 01/14/15  1358 History   First MD Initiated Contact with Patient 01/14/15 1507     Chief Complaint  Patient presents with  . Fatigue  . Hyperglycemia     (Consider location/radiation/quality/duration/timing/severity/associated sxs/prior Treatment) HPI Comments: Patient with past medical history of hypertension and hyperlipidemia presents to the emergency department with chief complaint of fatigue, polydipsia, polyuria. Patient states that he has been feeling tired for the past 2 weeks. States that he has also been excessively thirsty. He denies any sick contacts. Denies any chest pain, shortness breath, nausea, vomiting, diarrhea, constipation, abdominal pain, or dysuria. Has not taken anything to alleviate his symptoms. There are no aggravating or alleviating factors. He has never been diagnosed with diabetes, but states that it runs in his family.  The history is provided by the patient. No language interpreter was used.    Past Medical History  Diagnosis Date  . Hypertension   . Ruptured lumbar disc   . Back pain, chronic    Past Surgical History  Procedure Laterality Date  . Back surgery    . Abdominal surgery     Family History  Problem Relation Age of Onset  . Diabetes Mother   . Diabetes Sister   . Diabetes Brother    History  Substance Use Topics  . Smoking status: Current Some Day Smoker    Types: Cigarettes  . Smokeless tobacco: Not on file  . Alcohol Use: No    Review of Systems  Constitutional: Positive for fatigue. Negative for fever and chills.  Respiratory: Negative for shortness of breath.   Cardiovascular: Negative for chest pain.  Gastrointestinal: Negative for nausea, vomiting, diarrhea and constipation.  Endocrine: Positive for polydipsia and polyuria.  Genitourinary: Negative for dysuria.  All other systems reviewed and are negative.     Allergies  Lisinopril  Home Medications   Prior to Admission  medications   Medication Sig Start Date End Date Taking? Authorizing Provider  amLODipine (NORVASC) 5 MG tablet Take 5 mg by mouth daily.    Historical Provider, MD  cyclobenzaprine (FLEXERIL) 10 MG tablet Take 1 tablet (10 mg total) by mouth 3 (three) times daily as needed for muscle spasms. 08/25/14   Lance Bosch, NP  esomeprazole (NEXIUM) 40 MG capsule Take 40 mg by mouth daily at 12 noon.    Historical Provider, MD  hydrochlorothiazide (HYDRODIURIL) 25 MG tablet Take 25 mg by mouth daily.    Historical Provider, MD  meloxicam (MOBIC) 15 MG tablet Take 1 tablet (15 mg total) by mouth daily. 08/25/14   Lance Bosch, NP  naproxen sodium (ANAPROX) 220 MG tablet Take 440 mg by mouth daily as needed (pain).    Historical Provider, MD  predniSONE (DELTASONE) 20 MG tablet 3 tabs po day one, then 2 tabs daily x 4 days 10/20/14   Marissa Sciacca, PA-C  traMADol (ULTRAM) 50 MG tablet Take 1 tablet (50 mg total) by mouth every 6 (six) hours as needed. 10/20/14   Marissa Sciacca, PA-C   BP 131/94 mmHg  Pulse 96  Temp(Src) 98.6 F (37 C) (Oral)  Resp 18  Ht 5\' 11"  (1.803 m)  Wt 172 lb (78.019 kg)  BMI 24.00 kg/m2  SpO2 96% Physical Exam  Constitutional: He is oriented to person, place, and time. He appears well-developed and well-nourished.  HENT:  Head: Normocephalic and atraumatic.  Eyes: Conjunctivae and EOM are normal. Pupils are equal, round, and reactive to  light. Right eye exhibits no discharge. Left eye exhibits no discharge. No scleral icterus.  Neck: Normal range of motion. Neck supple. No JVD present.  Cardiovascular: Normal rate, regular rhythm and normal heart sounds.  Exam reveals no gallop and no friction rub.   No murmur heard. Pulmonary/Chest: Effort normal and breath sounds normal. No respiratory distress. He has no wheezes. He has no rales. He exhibits no tenderness.  Abdominal: Soft. He exhibits no distension and no mass. There is no tenderness. There is no rebound and no  guarding.  Musculoskeletal: Normal range of motion. He exhibits no edema or tenderness.  Neurological: He is alert and oriented to person, place, and time.  Skin: Skin is warm and dry.  Psychiatric: He has a normal mood and affect. His behavior is normal. Judgment and thought content normal.  Nursing note and vitals reviewed.   ED Course  Procedures (including critical care time) Results for orders placed or performed during the hospital encounter of 01/14/15  Comprehensive metabolic panel  Result Value Ref Range   Sodium 126 (L) 135 - 145 mmol/L   Potassium 4.6 3.5 - 5.1 mmol/L   Chloride 90 (L) 96 - 112 mmol/L   CO2 27 19 - 32 mmol/L   Glucose, Bld 644 (HH) 70 - 99 mg/dL   BUN 18 6 - 23 mg/dL   Creatinine, Ser 1.70 (H) 0.50 - 1.35 mg/dL   Calcium 9.2 8.4 - 10.5 mg/dL   Total Protein 6.6 6.0 - 8.3 g/dL   Albumin 3.3 (L) 3.5 - 5.2 g/dL   AST 25 0 - 37 U/L   ALT 30 0 - 53 U/L   Alkaline Phosphatase 85 39 - 117 U/L   Total Bilirubin 0.6 0.3 - 1.2 mg/dL   GFR calc non Af Amer 44 (L) >90 mL/min   GFR calc Af Amer 51 (L) >90 mL/min   Anion gap 9 5 - 15  Urinalysis, Routine w reflex microscopic  Result Value Ref Range   Color, Urine YELLOW YELLOW   APPearance CLEAR CLEAR   Specific Gravity, Urine 1.037 (H) 1.005 - 1.030   pH 5.5 5.0 - 8.0   Glucose, UA >1000 (A) NEGATIVE mg/dL   Hgb urine dipstick NEGATIVE NEGATIVE   Bilirubin Urine NEGATIVE NEGATIVE   Ketones, ur NEGATIVE NEGATIVE mg/dL   Protein, ur NEGATIVE NEGATIVE mg/dL   Urobilinogen, UA 0.2 0.0 - 1.0 mg/dL   Nitrite NEGATIVE NEGATIVE   Leukocytes, UA NEGATIVE NEGATIVE  CBC  Result Value Ref Range   WBC 5.3 4.0 - 10.5 K/uL   RBC 4.57 4.22 - 5.81 MIL/uL   Hemoglobin 13.4 13.0 - 17.0 g/dL   HCT 39.8 39.0 - 52.0 %   MCV 87.1 78.0 - 100.0 fL   MCH 29.3 26.0 - 34.0 pg   MCHC 33.7 30.0 - 36.0 g/dL   RDW 12.3 11.5 - 15.5 %   Platelets 285 150 - 400 K/uL  Urine microscopic-add on  Result Value Ref Range   Squamous  Epithelial / LPF RARE RARE   WBC, UA 0-2 <3 WBC/hpf  CBG monitoring, ED  Result Value Ref Range   Glucose-Capillary >600 (HH) 70 - 99 mg/dL  CBG monitoring, ED  Result Value Ref Range   Glucose-Capillary 97 70 - 99 mg/dL  I-stat chem 8, ed  Result Value Ref Range   Sodium 140 135 - 145 mmol/L   Potassium 4.7 3.5 - 5.1 mmol/L   Chloride 104 96 - 112 mmol/L   BUN  22 6 - 23 mg/dL   Creatinine, Ser 1.30 0.50 - 1.35 mg/dL   Glucose, Bld 115 (H) 70 - 99 mg/dL   Calcium, Ion 1.20 1.12 - 1.23 mmol/L   TCO2 24 0 - 100 mmol/L   Hemoglobin 12.6 (L) 13.0 - 17.0 g/dL   HCT 37.0 (L) 39.0 - 52.0 %  CBG monitoring, ED  Result Value Ref Range   Glucose-Capillary 115 (H) 70 - 99 mg/dL   No results found. '  EKG Interpretation None      MDM   Final diagnoses:  Hyperglycemia  AKI (acute kidney injury)    Patient with profound hyperglycemia. No history of diabetes. Patient is generally well-appearing, but reports being fatigued 2 weeks. I will give fluids, insulin, check labs, and will reassess.  Glucoses 644. Anion gap is 9, doubt DKA. Normal bicarbonate.  Patient seen by and discussed with Dr. Roxanne Mins, who recommends admission.  Patient discussed with Family Practice, who are agreeable with admission.  6:49 PM Chem 8 shows Cr has normalized.  Family Practice calls back and recommends discharge with metformin.  Discussed this with Dr. Roxanne Mins, who would still prefer that the patient get admitted, but states that it is reasonable to discharge with metformin.  Patient has the orange card and will be able to follow-up soon.  Will recheck CBG prior to discharge.  Montine Circle, PA-C 27/78/24 2353  David Glick, MD 61/44/31 5400

## 2015-01-14 NOTE — ED Notes (Addendum)
Pt reports feeling fatigued and weak x 2 weeks. Having dry mouth, excessive thirst, n/v, frequent urination. Cbg HIGH at triage, denies hx of diabetes.

## 2015-01-14 NOTE — ED Notes (Signed)
Report attempted 

## 2015-01-14 NOTE — Discharge Instructions (Signed)

## 2015-01-14 NOTE — ED Notes (Signed)
CBG 97. [facility] notified.

## 2015-01-19 ENCOUNTER — Ambulatory Visit: Payer: Self-pay | Admitting: Internal Medicine

## 2015-01-21 ENCOUNTER — Encounter (HOSPITAL_COMMUNITY): Payer: Self-pay | Admitting: Emergency Medicine

## 2015-01-21 DIAGNOSIS — Z79899 Other long term (current) drug therapy: Secondary | ICD-10-CM | POA: Insufficient documentation

## 2015-01-21 DIAGNOSIS — Z720 Tobacco use: Secondary | ICD-10-CM | POA: Insufficient documentation

## 2015-01-21 DIAGNOSIS — E860 Dehydration: Secondary | ICD-10-CM | POA: Insufficient documentation

## 2015-01-21 DIAGNOSIS — R110 Nausea: Secondary | ICD-10-CM | POA: Insufficient documentation

## 2015-01-21 DIAGNOSIS — H539 Unspecified visual disturbance: Secondary | ICD-10-CM | POA: Insufficient documentation

## 2015-01-21 DIAGNOSIS — I10 Essential (primary) hypertension: Secondary | ICD-10-CM | POA: Insufficient documentation

## 2015-01-21 DIAGNOSIS — Z8739 Personal history of other diseases of the musculoskeletal system and connective tissue: Secondary | ICD-10-CM | POA: Insufficient documentation

## 2015-01-21 DIAGNOSIS — R739 Hyperglycemia, unspecified: Secondary | ICD-10-CM | POA: Insufficient documentation

## 2015-01-21 DIAGNOSIS — G8929 Other chronic pain: Secondary | ICD-10-CM | POA: Insufficient documentation

## 2015-01-21 DIAGNOSIS — R51 Headache: Secondary | ICD-10-CM | POA: Insufficient documentation

## 2015-01-21 LAB — CBG MONITORING, ED: Glucose-Capillary: 356 mg/dL — ABNORMAL HIGH (ref 70–99)

## 2015-01-21 NOTE — ED Notes (Signed)
C/o nausea and dizziness at home.  Pt thinks symptoms are related to hyperglycemia.  States he doesn't have supplies to check blood sugar at home.

## 2015-01-22 ENCOUNTER — Emergency Department (HOSPITAL_COMMUNITY)
Admission: EM | Admit: 2015-01-22 | Discharge: 2015-01-22 | Disposition: A | Discharge status: Home / Self Care | Payer: Self-pay | Attending: Emergency Medicine | Admitting: Emergency Medicine

## 2015-01-22 DIAGNOSIS — R739 Hyperglycemia, unspecified: Secondary | ICD-10-CM

## 2015-01-22 DIAGNOSIS — E860 Dehydration: Secondary | ICD-10-CM

## 2015-01-22 LAB — CBG MONITORING, ED
GLUCOSE-CAPILLARY: 370 mg/dL — AB (ref 70–99)
Glucose-Capillary: 475 mg/dL — ABNORMAL HIGH (ref 70–99)

## 2015-01-22 LAB — URINALYSIS, ROUTINE W REFLEX MICROSCOPIC
Bilirubin Urine: NEGATIVE
HGB URINE DIPSTICK: NEGATIVE
Ketones, ur: NEGATIVE mg/dL
LEUKOCYTES UA: NEGATIVE
Nitrite: NEGATIVE
PROTEIN: NEGATIVE mg/dL
Specific Gravity, Urine: 1.04 — ABNORMAL HIGH (ref 1.005–1.030)
UROBILINOGEN UA: 0.2 mg/dL (ref 0.0–1.0)
pH: 5 (ref 5.0–8.0)

## 2015-01-22 LAB — COMPREHENSIVE METABOLIC PANEL
ALK PHOS: 72 U/L (ref 38–126)
ALT: 21 U/L (ref 17–63)
AST: 17 U/L (ref 15–41)
Albumin: 3.4 g/dL — ABNORMAL LOW (ref 3.5–5.0)
Anion gap: 12 (ref 5–15)
BUN: 16 mg/dL (ref 6–20)
CHLORIDE: 96 mmol/L — AB (ref 101–111)
CO2: 26 mmol/L (ref 22–32)
Calcium: 9.4 mg/dL (ref 8.9–10.3)
Creatinine, Ser: 1.32 mg/dL — ABNORMAL HIGH (ref 0.61–1.24)
GFR calc Af Amer: >60 mL/min (ref >60)
GFR, EST NON AFRICAN AMERICAN: 60 mL/min — AB (ref >60)
GLUCOSE: 437 mg/dL — AB (ref 70–99)
Potassium: 4.2 mmol/L (ref 3.5–5.1)
SODIUM: 134 mmol/L — AB (ref 135–145)
Total Bilirubin: 0.3 mg/dL (ref 0.3–1.2)
Total Protein: 6.6 g/dL (ref 6.5–8.1)

## 2015-01-22 LAB — CBC
HCT: 38.4 % — ABNORMAL LOW (ref 39.0–52.0)
HEMOGLOBIN: 12.8 g/dL — AB (ref 13.0–17.0)
MCH: 29.4 pg (ref 26.0–34.0)
MCHC: 33.3 g/dL (ref 30.0–36.0)
MCV: 88.1 fL (ref 78.0–100.0)
Platelets: 333 10*3/uL (ref 150–400)
RBC: 4.36 MIL/uL (ref 4.22–5.81)
RDW: 12.3 % (ref 11.5–15.5)
WBC: 6.6 10*3/uL (ref 4.0–10.5)

## 2015-01-22 LAB — URINE MICROSCOPIC-ADD ON

## 2015-01-22 MED ORDER — SODIUM CHLORIDE 0.9 % IV BOLUS (SEPSIS)
1000.0000 mL | Freq: Once | INTRAVENOUS | Status: AC
  Administered 2015-01-22: 1000 mL via INTRAVENOUS

## 2015-01-22 NOTE — ED Provider Notes (Signed)
CSN: [id number]     Arrival date & time 01/21/15  2311 History  This chart was scribed for Ripley Fraise, MD by Rayfield Citizen, ED Scribe. This patient was seen in room D33C/D33C and the patient's care was started at 12:45 AM.   Chief Complaint  Patient presents with  . Nausea  . Dizziness   Patient is a 54 y.o. male presenting with dizziness. The history is provided by the patient. No language interpreter was used.  Dizziness Quality:  Lightheadedness Severity:  Moderate Onset quality:  Gradual Duration:  3 hours Timing:  Constant Progression:  Improving Chronicity:  Recurrent Context comment:  Subjective hyperglycemia Relieved by:  None tried Worsened by:  Nothing Ineffective treatments:  None tried Associated symptoms: headaches, nausea and vision changes   Associated symptoms: no chest pain, no diarrhea, no shortness of breath and no weakness      HPI Comments: Ryan Medina is a 54 y.o. male who presents to the Emergency Department complaining of lightheadedness and nausea beginning tonight around 22:30. He also notes SOB and headache, both of which have resolved at present, and approximately 1.5 months of blurred vision. Patient reports prior experience with similar symptoms; he associates these with hyperglycemia, though he is unable to check his blood sugar at home at this time due to a lack of supplies. He denies chest pain, abdominal pain, slurred speech, numbness or focal weakness. He denies any other concerns at this time.   Past Medical History  Diagnosis Date  . Hypertension   . Ruptured lumbar disc   . Back pain, chronic    Past Surgical History  Procedure Laterality Date  . Back surgery    . Abdominal surgery     History  Substance Use Topics  . Smoking status: Current Some Day Smoker    Types: Cigarettes  . Smokeless tobacco: Not on file  . Alcohol Use: No    Review of Systems  Eyes: Positive for visual disturbance.  Respiratory: Negative for shortness  of breath.   Cardiovascular: Negative for chest pain.  Gastrointestinal: Positive for nausea. Negative for abdominal pain and diarrhea.  Neurological: Positive for dizziness and headaches. Negative for speech difficulty, weakness and numbness.  All other systems reviewed and are negative.   Allergies  Lisinopril  Home Medications   Prior to Admission medications   Medication Sig Start Date End Date Taking? Authorizing Provider  amLODipine (NORVASC) 5 MG tablet Take 5 mg by mouth daily.    Historical Provider, MD  cyclobenzaprine (FLEXERIL) 10 MG tablet Take 1 tablet (10 mg total) by mouth 3 (three) times daily as needed for muscle spasms. 08/25/14   Lance Bosch, NP  esomeprazole (NEXIUM) 40 MG capsule Take 40 mg by mouth daily at 12 noon.    Historical Provider, MD  hydrochlorothiazide (HYDRODIURIL) 25 MG tablet Take 25 mg by mouth daily.    Historical Provider, MD  meloxicam (MOBIC) 15 MG tablet Take 1 tablet (15 mg total) by mouth daily. 08/25/14   Lance Bosch, NP  metFORMIN (GLUCOPHAGE) 500 MG tablet Take 1 tablet (500 mg total) by mouth 2 (two) times daily with a meal. 01/14/15   Montine Circle, PA-C  naproxen sodium (ANAPROX) 220 MG tablet Take 440 mg by mouth daily as needed (pain).    Historical Provider, MD  predniSONE (DELTASONE) 20 MG tablet 3 tabs po day one, then 2 tabs daily x 4 days Patient not taking: Reported on 01/14/2015 10/20/14   Jamse Mead, PA-C  traMADol (ULTRAM) 50 MG tablet Take 1 tablet (50 mg total) by mouth every 6 (six) hours as needed. Patient taking differently: Take 50 mg by mouth every 6 (six) hours as needed for moderate pain.  10/20/14   Marissa Sciacca, PA-C   BP 133/91 mmHg  Pulse 96  Temp(Src) 98.2 F (36.8 C) (Oral)  Resp 18  Ht 5\' 11"  (1.803 m)  Wt 174 lb (78.926 kg)  BMI 24.28 kg/m2  SpO2 99% Physical Exam  Nursing note and vitals reviewed.  CONSTITUTIONAL: Well developed/well nourished HEAD: Normocephalic/atraumatic EYES:  EOMI/PERRL, no nystagmus, no ptosis ENMT: Mucous membranes moist NECK: supple no meningeal signs. CV: S1/S2 noted, no murmurs/rubs/gallops noted LUNGS: Lungs are clear to auscultation bilaterally, no apparent distress ABDOMEN: soft, nontender, no rebound or guarding GU:no cva tenderness NEURO:Awake/alert, facies symmetric, no arm or leg drift is noted Equal 5/5 strength with shoulder abduction, elbow flex/extension, wrist flex/extension in upper extremities and equal hand grips bilaterally Equal 5/5 strength with hip flexion,knee flex/extension, foot dorsi/plantar flexion Cranial nerves 3/4/5/6/03/30/09/11/12 tested and intact Gait normal without ataxia No past pointing Sensation to light touch intact in all extremities EXTREMITIES: pulses normal, full ROM SKIN: warm, color normal PSYCH: no abnormalities of mood noted  ED Course  Procedures   DIAGNOSTIC STUDIES: Oxygen Saturation is 99% on RA, normal by my interpretation.    COORDINATION OF CARE: 12:50 AM Discussed treatment plan with pt at bedside and pt agreed to plan.    Pt sleeping during ED stay He is well appearing He reports most of his symptoms he feels are due to hyperglycemia He was recently started on metformin Encouraged to continue this and he reports he has f/u soon  Labs Review Labs Reviewed  CBC - Abnormal; Notable for the following:    Hemoglobin 12.8 (*)    HCT 38.4 (*)    All other components within normal limits  COMPREHENSIVE METABOLIC PANEL - Abnormal; Notable for the following:    Sodium 134 (*)    Chloride 96 (*)    Glucose, Bld 437 (*)    Creatinine, Ser 1.32 (*)    Albumin 3.4 (*)    GFR calc non Af Amer 60 (*)    All other components within normal limits  URINALYSIS, ROUTINE W REFLEX MICROSCOPIC - Abnormal; Notable for the following:    Specific Gravity, Urine 1.040 (*)    Glucose, UA >1000 (*)    All other components within normal limits  CBG MONITORING, ED - Abnormal; Notable for the  following:    Glucose-Capillary 356 (*)    All other components within normal limits  CBG MONITORING, ED - Abnormal; Notable for the following:    Glucose-Capillary 475 (*)    All other components within normal limits  CBG MONITORING, ED - Abnormal; Notable for the following:    Glucose-Capillary 370 (*)    All other components within normal limits  URINE MICROSCOPIC-ADD ON     MDM   Final diagnoses:  Hyperglycemia  Dehydration    Nursing notes including past medical history and social history reviewed and considered in documentation Labs/vital reviewed myself and considered during evaluation   I personally performed the services described in this documentation, which was scribed in my presence. The recorded information has been reviewed and is accurate.       Ripley Fraise, MD 01/22/15 [phone]

## 2015-01-22 NOTE — ED Notes (Signed)
CBG 370. 

## 2015-01-22 NOTE — Discharge Instructions (Signed)

## 2015-01-22 NOTE — ED Provider Notes (Signed)
EKG Interpretation  Date/Time:  Monday Jan 22 2015 00:34:14 EDT Ventricular Rate:  75 PR Interval:  157 QRS Duration: 92 QT Interval:  383 QTC Calculation: 428 R Axis:   64 Text Interpretation:  Sinus rhythm Consider left atrial enlargement Minimal ST elevation, anterior leads No significant change since last tracing Confirmed by Christy Gentles  MD, Laconda Basich ([id number]) on 01/22/2015 12:51:17 AM        Ripley Fraise, MD 01/22/15 [phone]

## 2015-02-24 ENCOUNTER — Encounter (HOSPITAL_COMMUNITY): Payer: Self-pay | Admitting: Emergency Medicine

## 2015-02-24 ENCOUNTER — Emergency Department (HOSPITAL_COMMUNITY)
Admission: EM | Admit: 2015-02-24 | Discharge: 2015-02-24 | Disposition: A | Discharge status: Home / Self Care | Payer: Self-pay | Attending: Emergency Medicine | Admitting: Emergency Medicine

## 2015-02-24 ENCOUNTER — Encounter (HOSPITAL_COMMUNITY): Payer: Self-pay | Admitting: *Deleted

## 2015-02-24 DIAGNOSIS — Z79899 Other long term (current) drug therapy: Secondary | ICD-10-CM | POA: Insufficient documentation

## 2015-02-24 DIAGNOSIS — M5431 Sciatica, right side: Secondary | ICD-10-CM | POA: Insufficient documentation

## 2015-02-24 DIAGNOSIS — I10 Essential (primary) hypertension: Secondary | ICD-10-CM | POA: Insufficient documentation

## 2015-02-24 DIAGNOSIS — Z720 Tobacco use: Secondary | ICD-10-CM | POA: Insufficient documentation

## 2015-02-24 DIAGNOSIS — G8929 Other chronic pain: Secondary | ICD-10-CM | POA: Insufficient documentation

## 2015-02-24 DIAGNOSIS — M79604 Pain in right leg: Secondary | ICD-10-CM | POA: Insufficient documentation

## 2015-02-24 DIAGNOSIS — Z9889 Other specified postprocedural states: Secondary | ICD-10-CM | POA: Insufficient documentation

## 2015-02-24 MED ORDER — METHOCARBAMOL 500 MG PO TABS: 1000.0000 mg | ORAL_TABLET | Freq: Four times a day (QID) | ORAL | Status: DC

## 2015-02-24 MED ORDER — TRAMADOL HCL 50 MG PO TABS
50.0000 mg | ORAL_TABLET | Freq: Once | ORAL | Status: AC
  Administered 2015-02-24: 50 mg via ORAL
  Filled 2015-02-24: qty 1

## 2015-02-24 MED ORDER — TRAMADOL HCL 50 MG PO TABS: 50.0000 mg | ORAL_TABLET | Freq: Four times a day (QID) | ORAL | Status: DC | PRN

## 2015-02-24 MED ORDER — CYCLOBENZAPRINE HCL 10 MG PO TABS
10.0000 mg | ORAL_TABLET | Freq: Once | ORAL | Status: AC
  Administered 2015-02-24: 10 mg via ORAL
  Filled 2015-02-24: qty 1

## 2015-02-24 NOTE — ED Notes (Signed)
Pt reports Pain to RT hip and RT leg started again.

## 2015-02-24 NOTE — ED Notes (Signed)
Patient here with right leg pain. States pain starts at thigh and extends to lower leg. Was seen here today for the same. States presents now because pain is still bad. Reports that he can't get his medications until Monday.

## 2015-02-24 NOTE — ED Provider Notes (Signed)
CSN: [id number]     Arrival date & time 02/24/15  2043 History   First MD Initiated Contact with Patient 02/24/15 2143     Chief Complaint  Patient presents with  . Leg Pain     (Consider location/radiation/quality/duration/timing/severity/associated sxs/prior Treatment) Patient is a 54 y.o. male presenting with leg pain. The history is provided by the patient. No language interpreter was used.  Leg Pain Location:  Leg Injury: no   Leg location:  R leg Pain details:    Quality:  Aching   Radiates to:  R leg   Severity:  Severe   Onset quality:  Gradual   Duration:  3 days   Timing:  Constant   Progression:  Worsening Chronicity:  New Dislocation: no   Foreign body present:  No foreign bodies Tetanus status:  Unknown Prior injury to area:  No Relieved by:  Nothing Worsened by:  Nothing tried Ineffective treatments:  None tried Associated symptoms: no fatigue, no fever and no neck pain   Risk factors: no concern for non-accidental trauma, no frequent fractures, no known bone disorder, no obesity and no recent illness     Past Medical History  Diagnosis Date  . Hypertension   . Ruptured lumbar disc   . Back pain, chronic    Past Surgical History  Procedure Laterality Date  . Back surgery    . Abdominal surgery     Family History  Problem Relation Age of Onset  . Diabetes Mother   . Diabetes Sister   . Diabetes Brother    History  Substance Use Topics  . Smoking status: Current Some Day Smoker    Types: Cigarettes  . Smokeless tobacco: Not on file  . Alcohol Use: No    Review of Systems  Constitutional: Negative for fever, chills and fatigue.  HENT: Negative for trouble swallowing.   Eyes: Negative for visual disturbance.  Respiratory: Negative for shortness of breath.   Cardiovascular: Negative for chest pain and palpitations.  Gastrointestinal: Negative for nausea, vomiting, abdominal pain and diarrhea.  Genitourinary: Negative for dysuria and  difficulty urinating.  Musculoskeletal: Positive for myalgias. Negative for arthralgias and neck pain.  Skin: Negative for color change.  Neurological: Negative for dizziness and weakness.  Psychiatric/Behavioral: Negative for dysphoric mood.      Allergies  Lisinopril  Home Medications   Prior to Admission medications   Medication Sig Start Date End Date Taking? Authorizing Provider  amLODipine (NORVASC) 5 MG tablet Take 5 mg by mouth daily.    Historical Provider, MD  cyclobenzaprine (FLEXERIL) 10 MG tablet Take 1 tablet (10 mg total) by mouth 3 (three) times daily as needed for muscle spasms. 08/25/14   Lance Bosch, NP  esomeprazole (NEXIUM) 40 MG capsule Take 40 mg by mouth daily at 12 noon.    Historical Provider, MD  hydrochlorothiazide (HYDRODIURIL) 25 MG tablet Take 25 mg by mouth daily.    Historical Provider, MD  meloxicam (MOBIC) 15 MG tablet Take 1 tablet (15 mg total) by mouth daily. 08/25/14   Lance Bosch, NP  metFORMIN (GLUCOPHAGE) 500 MG tablet Take 1 tablet (500 mg total) by mouth 2 (two) times daily with a meal. 01/14/15   Montine Circle, PA-C  methocarbamol (ROBAXIN) 500 MG tablet Take 2 tablets (1,000 mg total) by mouth 4 (four) times daily. 02/24/15   Carlisle Cater, PA-C  naproxen sodium (ANAPROX) 220 MG tablet Take 440 mg by mouth daily as needed (pain).    Historical Provider,  MD  traMADol (ULTRAM) 50 MG tablet Take 1 tablet (50 mg total) by mouth every 6 (six) hours as needed. 02/24/15   Carlisle Cater, PA-C   BP 153/105 mmHg  Pulse 79  Temp(Src) 98.6 F (37 C) (Oral)  Resp 16  Ht 5\' 11"  (1.803 m)  Wt 173 lb (78.472 kg)  BMI 24.14 kg/m2  SpO2 100% Physical Exam  Constitutional: He is oriented to person, place, and time. He appears well-developed and well-nourished. No distress.  HENT:  Head: Normocephalic and atraumatic.  Eyes: Conjunctivae are normal.  Neck: Normal range of motion.  Cardiovascular: Normal rate and regular rhythm.  Exam reveals no  gallop and no friction rub.   No murmur heard. Pulmonary/Chest: Effort normal and breath sounds normal. He has no wheezes. He has no rales. He exhibits no tenderness.  Abdominal: Soft. He exhibits no distension. There is no tenderness. There is no rebound.  Musculoskeletal: Normal range of motion.  Right gluteal tenderness to palpation. No midline spine tenderness to palpation.   Neurological: He is alert and oriented to person, place, and time. Coordination normal.  Lower extremity strength and sensation equal and intact bilaterally. Speech is goal-oriented. Moves limbs without ataxia.   Skin: Skin is warm and dry.  Psychiatric: He has a normal mood and affect. His behavior is normal.  Nursing note and vitals reviewed.   ED Course  Procedures (including critical care time) Labs Review Labs Reviewed - No data to display  Imaging Review No results found.   EKG Interpretation None      MDM   Final diagnoses:  Sciatica, right    Patient has continued sciatica without relief because he has been unable to get medication filled. No neuro deficits noted. No bladder/bowel incontinence or saddle paresthesias.     Alvina Chou, PA-C 02/24/15 2356  Ezequiel Essex, MD 02/25/15 [phone]

## 2015-02-24 NOTE — Discharge Instructions (Signed)
Fill your prescriptions on Monday. Return to the ED with worsening or concerning symptoms.

## 2015-02-24 NOTE — ED Notes (Signed)
Declined W/C at D/C and was escorted to lobby by RN. 

## 2015-02-24 NOTE — Discharge Instructions (Signed)
Please read and follow all provided instructions.  Your diagnoses today include:  1. Sciatica, right     Tests performed today include:  Vital signs - see below for your results today  Medications prescribed:   Tramadol - narcotic-like pain medication  DO NOT drive or perform any activities that require you to be awake and alert because this medicine can make you drowsy.    Naproxen - anti-inflammatory pain medication  Do not exceed 500mg  naproxen every 12 hours, take with food  You have been prescribed an anti-inflammatory medication or NSAID. Take with food. Take smallest effective dose for the shortest duration needed for your pain. Stop taking if you experience stomach pain or vomiting.   Take any prescribed medications only as directed.  Home care instructions:   Follow any educational materials contained in this packet  Please rest, use ice or heat on your back for the next several days  Do not lift, push, pull anything more than 10 pounds for the next week  Follow-up instructions: Please follow-up with your primary care provider in the next 1 week for further evaluation of your symptoms.   Return instructions:  SEEK IMMEDIATE MEDICAL ATTENTION IF YOU HAVE:  New numbness, tingling, weakness, or problem with the use of your arms or legs  Severe back pain not relieved with medications  Loss control of your bowels or bladder  Increasing pain in any areas of the body (such as chest or abdominal pain)  Shortness of breath, dizziness, or fainting.   Worsening nausea (feeling sick to your stomach), vomiting, fever, or sweats  Any other emergent concerns regarding your health   Additional Information:  Your vital signs today were: BP 135/87 mmHg   Pulse 93   Temp(Src) 98.1 F (36.7 C) (Oral)   Resp 16   Ht 5\' 11"  (1.803 m)   Wt 171 lb (77.565 kg)   BMI 23.86 kg/m2   SpO2 97% If your blood pressure (BP) was elevated above 135/85 this visit, please have this  repeated by your doctor within one month. --------------

## 2015-02-24 NOTE — ED Provider Notes (Signed)
CSN: [id number]     Arrival date & time 02/24/15  1017 History   This chart was scribed for Carlisle Cater, PA-C, working with Pattricia Boss, MD by Julien Nordmann, ED Scribe. This patient was seen in room TR07C/TR07C and the patient's care was started at 11:09 AM.     Chief Complaint  Patient presents with  . Hip Pain  . Leg Pain     The history is provided by the patient. No language interpreter was used.     HPI Comments: Ryan Medina is a 54 y.o. male who has past medical history of DM presents to the Emergency Department complaining of intermittent, gradual worsening right leg pain onset 2 days ago. Pt states it is a sharp pain that radiates to his ankles. Pt notes he had back surgery 7 years ago and he has had leg pain prior to his surgery. Pt in unable to sit or ambulate for a long period of time without having pain. He denies bowel/bladder incontinence, numbness, iv drugs, fevers, weight loss, and history of cancer.  Past Medical History  Diagnosis Date  . Hypertension   . Ruptured lumbar disc   . Back pain, chronic    Past Surgical History  Procedure Laterality Date  . Back surgery    . Abdominal surgery     Family History  Problem Relation Age of Onset  . Diabetes Mother   . Diabetes Sister   . Diabetes Brother    History  Substance Use Topics  . Smoking status: Current Some Day Smoker    Types: Cigarettes  . Smokeless tobacco: Not on file  . Alcohol Use: No    Review of Systems  Constitutional: Negative for fever and unexpected weight change.  Gastrointestinal: Negative for constipation.       Neg for fecal incontinence  Genitourinary: Negative for hematuria, flank pain and difficulty urinating.       Negative for urinary incontinence or retention  Musculoskeletal: Positive for myalgias and arthralgias.  Neurological: Negative for weakness and numbness.       Negative for saddle paresthesias       Allergies  Lisinopril  Home Medications   Prior to  Admission medications   Medication Sig Start Date End Date Taking? Authorizing Provider  amLODipine (NORVASC) 5 MG tablet Take 5 mg by mouth daily.    Historical Provider, MD  cyclobenzaprine (FLEXERIL) 10 MG tablet Take 1 tablet (10 mg total) by mouth 3 (three) times daily as needed for muscle spasms. Patient not taking: Reported on 01/22/2015 08/25/14   Lance Bosch, NP  esomeprazole (NEXIUM) 40 MG capsule Take 40 mg by mouth daily at 12 noon.    Historical Provider, MD  hydrochlorothiazide (HYDRODIURIL) 25 MG tablet Take 25 mg by mouth daily.    Historical Provider, MD  meloxicam (MOBIC) 15 MG tablet Take 1 tablet (15 mg total) by mouth daily. Patient not taking: Reported on 01/22/2015 08/25/14   Lance Bosch, NP  metFORMIN (GLUCOPHAGE) 500 MG tablet Take 1 tablet (500 mg total) by mouth 2 (two) times daily with a meal. 01/14/15   Montine Circle, PA-C  naproxen sodium (ANAPROX) 220 MG tablet Take 440 mg by mouth daily as needed (pain).    Historical Provider, MD  predniSONE (DELTASONE) 20 MG tablet 3 tabs po day one, then 2 tabs daily x 4 days Patient not taking: Reported on 01/14/2015 10/20/14   Marissa Sciacca, PA-C  traMADol (ULTRAM) 50 MG tablet Take 1 tablet (50  mg total) by mouth every 6 (six) hours as needed. Patient not taking: Reported on 01/22/2015 10/20/14   Jamse Mead, PA-C   Triage vitals: BP 135/87 mmHg  Pulse 93  Temp(Src) 98.1 F (36.7 C) (Oral)  Resp 16  Ht 5\' 11"  (1.803 m)  Wt 171 lb (77.565 kg)  BMI 23.86 kg/m2  SpO2 97% Physical Exam  Constitutional: He is oriented to person, place, and time. He appears well-developed and well-nourished. No distress.  HENT:  Head: Normocephalic and atraumatic.  Eyes: Conjunctivae are normal. Pupils are equal, round, and reactive to light. Right eye exhibits no discharge. Left eye exhibits no discharge. No scleral icterus.  Neck: Normal range of motion. Neck supple. No thyromegaly present.  Cardiovascular: Normal rate, regular  rhythm and normal heart sounds.  Exam reveals no gallop and no friction rub.   No murmur heard. Pulmonary/Chest: Effort normal and breath sounds normal. No respiratory distress. He has no wheezes. He has no rales.  Abdominal: Soft. Bowel sounds are normal. He exhibits no distension. There is no tenderness. There is no rebound and no CVA tenderness.  Musculoskeletal:       Right hip: Normal.       Left hip: Normal.       Right knee: Normal.       Left knee: Normal.       Right ankle: Normal.       Left ankle: Normal.       Lumbar back: He exhibits normal range of motion, no tenderness and no bony tenderness.  No step-off noted with palpation of spine.   Neurological: He is alert and oriented to person, place, and time. He has normal reflexes. No sensory deficit. He exhibits normal muscle tone.  5/5 strength in entire lower extremities bilaterally. No sensation deficit. No foot drop.  Skin: Skin is warm and dry. No rash noted.  Psychiatric: He has a normal mood and affect. His behavior is normal.  Nursing note and vitals reviewed.   ED Course  Procedures  DIAGNOSTIC STUDIES: Oxygen Saturation is 97% on RA, normal by my interpretation.  COORDINATION OF CARE:  11:13 AM Discussed treatment plan which includes pain medication and follow up with PCP if pain persists with pt at bedside and pt agreed to plan.   Labs Review Labs Reviewed - No data to display  Imaging Review No results found.   EKG Interpretation None       11:39 AM Patient seen and examined. Work-up initiated. Medications ordered.   Vital signs reviewed and are as follows: Filed Vitals:   02/24/15 1022  BP: 135/87  Pulse: 93  Temp: 98.1 F (36.7 C)  Resp: 16    No red flag s/s of low back pain. Patient was counseled on back pain precautions and told to do activity as tolerated but do not lift, push, or pull heavy objects more than 10 pounds for the next week.  Patient counseled to use ice or heat on back for  no longer than 15 minutes every hour.   Patient prescribed muscle relaxer and counseled on proper use of muscle relaxant medication.    Patient prescribed narcotic pain medicine and counseled on proper use of narcotic pain medications. Counseled not to combine this medication with others containing tylenol.   Urged patient not to drink alcohol, drive, or perform any other activities that requires focus while taking either of these medications.  Patient urged to follow-up with PCP if pain does not improve with treatment  and rest or if pain becomes recurrent. Urged to return with worsening severe pain, loss of bowel or bladder control, trouble walking.   The patient verbalizes understanding and agrees with the plan.     MDM   Final diagnoses:  Sciatica, right   Patient with radicular type pain in leg. No neurological deficits. Patient is ambulatory. No warning symptoms of back pain including: fecal incontinence, urinary retention or overflow incontinence, night sweats, waking from sleep with back pain, unexplained fevers or weight loss, h/o cancer, IVDU, recent trauma. No concern for cauda equina, epidural abscess, or other serious cause of back pain. Conservative measures such as rest, ice/heat and pain medicine indicated with PCP follow-up if no improvement with conservative management.    I personally performed the services described in this documentation, which was scribed in my presence. The recorded information has been reviewed and is accurate.    Carlisle Cater, PA-C 02/24/15 [place], MD 03/02/15 [phone]

## 2015-02-27 ENCOUNTER — Emergency Department (HOSPITAL_COMMUNITY)
Admission: EM | Admit: 2015-02-27 | Discharge: 2015-02-27 | Disposition: A | Discharge status: Home / Self Care | Payer: Self-pay | Attending: Emergency Medicine | Admitting: Emergency Medicine

## 2015-02-27 ENCOUNTER — Encounter (HOSPITAL_COMMUNITY): Payer: Self-pay | Admitting: Emergency Medicine

## 2015-02-27 DIAGNOSIS — M25551 Pain in right hip: Secondary | ICD-10-CM

## 2015-02-27 DIAGNOSIS — Z79899 Other long term (current) drug therapy: Secondary | ICD-10-CM | POA: Insufficient documentation

## 2015-02-27 DIAGNOSIS — Z720 Tobacco use: Secondary | ICD-10-CM | POA: Insufficient documentation

## 2015-02-27 DIAGNOSIS — I10 Essential (primary) hypertension: Secondary | ICD-10-CM | POA: Insufficient documentation

## 2015-02-27 DIAGNOSIS — M5431 Sciatica, right side: Secondary | ICD-10-CM | POA: Insufficient documentation

## 2015-02-27 DIAGNOSIS — G8929 Other chronic pain: Secondary | ICD-10-CM | POA: Insufficient documentation

## 2015-02-27 MED ORDER — MELOXICAM 15 MG PO TABS: 15.0000 mg | ORAL_TABLET | Freq: Every day | ORAL | Status: DC

## 2015-02-27 MED ORDER — HYDROCODONE-ACETAMINOPHEN 5-325 MG PO TABS
2.0000 | ORAL_TABLET | Freq: Once | ORAL | Status: AC
  Administered 2015-02-27: 2 via ORAL
  Filled 2015-02-27: qty 2

## 2015-02-27 NOTE — Discharge Instructions (Signed)
Chronic Back Pain ° When back pain lasts longer than 3 months, it is called chronic back pain. People with chronic back pain often go through certain periods that are more intense (flare-ups).  °CAUSES °Chronic back pain can be caused by wear and tear (degeneration) on different structures in your back. These structures include: °· The bones of your spine (vertebrae) and the joints surrounding your spinal cord and nerve roots (facets). °· The strong, fibrous tissues that connect your vertebrae (ligaments). °Degeneration of these structures may result in pressure on your nerves. This can lead to constant pain. °HOME CARE INSTRUCTIONS °· Avoid bending, heavy lifting, prolonged sitting, and activities which make the problem worse. °· Take brief periods of rest throughout the day to reduce your pain. Lying down or standing usually is better than sitting while you are resting. °· Take over-the-counter or prescription medicines only as directed by your caregiver. °SEEK IMMEDIATE MEDICAL CARE IF:  °· You have weakness or numbness in one of your legs or feet. °· You have trouble controlling your bladder or bowels. °· You have nausea, vomiting, abdominal pain, shortness of breath, or fainting. °Document Released: 10/16/2004 Document Revised: 12/01/2011 Document Reviewed: 08/23/2011 °ExitCare® Patient Information ©2015 [employer]. This information is not intended to replace advice given to you by your health care provider. Make sure you discuss any questions you have with your health care provider. ° ° °Emergency Department Resource Guide °1) Find a Doctor and Pay Out of Pocket °Although you won't have to find out who is covered by your insurance plan, it is a good idea to ask around and get recommendations. You will then need to call the office and see if the doctor you have chosen will accept you as a new patient and what types of options they offer for patients who are self-pay. Some doctors offer discounts or will set  up payment plans for their patients who do not have insurance, but you will need to ask so you aren't surprised when you get to your appointment. ° °2) Contact Your Local Health Department °Not all health departments have doctors that can see patients for sick visits, but many do, so it is worth a call to see if yours does. If you don't know where your local health department is, you can check in your phone book. The CDC also has a tool to help you locate your state's health department, and many state websites also have listings of all of their local health departments. ° °3) Find a Walk-in Clinic °If your illness is not likely to be very severe or complicated, you may want to try a walk in clinic. These are popping up all over the country in pharmacies, drugstores, and shopping centers. They're usually staffed by nurse practitioners or physician assistants that have been trained to treat common illnesses and complaints. They're usually fairly quick and inexpensive. However, if you have serious medical issues or chronic medical problems, these are probably not your best option. ° °No Primary Care Doctor: °- Call [facility] at  832-8000 - they can help you locate a primary care doctor that  accepts your insurance, provides certain services, etc. °- Physician Referral Service- 1-800-533-3463 ° °Chronic Pain Problems: °Organization         Address  Phone   Notes  °[facility]  [phone] Patients need to be referred by their primary care doctor.  ° °Medication Assistance: °Organization         Address    Phone   Notes  °[place] [facility] [address] °[place], [place] [address] [phone]) [phone] --Must be a resident of [place] °-- Must have NO insurance coverage whatsoever (no Medicaid/ Medicare, etc.) °-- The pt. MUST have a primary care doctor that directs their care regularly and follows them in the community °  °[facility]  (866) 331-1348    °[employer]  (888) 892-1162   ° °Agencies that provide inexpensive medical care: °Organization         Address  Phone   Notes  °[facility]  ([phone]) [phone]   °[facility]    ([phone]) [phone]   °[facility] [address] °[place], [place] [address] ([phone]) [phone]   °[facility] [address], °[place] ([phone]) [phone]   °[facility]    (336) 373-0678   °[facility]    ([phone]) [phone]   °[facility] ° [address], [place] Phone:  (336) 832-4444, Fax:  ([phone]) [phone] Hours of Operation:  9 am - 6 pm, M-F.  Also accepts Medicaid/Medicare and self-pay.  ° [facility] ° [address], [place] Phone: (336) [phone], Fax: ([phone]) [phone]. Hours of Operation:  8:30 am - 5:30 pm, M-F.  Also accepts Medicaid and self-pay.  °[facility] [place] [address], [place] Phone: ([phone]) [phone]   °[facility] [address], [place], [place] (336)[phone], Ext. 123 Mondays & Thursdays: 7-9 AM.  First 15 patients are seen on a first come, first serve basis. °  ° °Medicaid-accepting [place] Providers: ° °Organization         Address  Phone   Notes  °[facility] [address], [place] ([phone]) [phone] Also accepts self-pay patients.  °[facility] [address], [place] ° ([phone]) [phone]   °[facility] [address], [place] ([phone]) [phone]   °[facility] [address] [place] [address], [place] ([phone]) [phone]   °[facility] [address], [place]  ° ([phone]) [phone] Only accepts [place] Access Medicaid patients after they have their name applied to their card.  ° °Self-Pay (no insurance) in [place]: ° °Organization         Address  Phone   Notes  °Sickle Cell Patients, [facility] [address],  [place] ([phone]) [phone]   °[facility] [address], [place] ([phone]) [phone]   °[facility] ° [address], [place] ([phone]) [phone]   °[facility]/Dr. Osei-Bonsu ° [address] [place] [address], [place] or [address], [place] [phone]) [phone] Phone number for both [place] and [place] locations is the same.  °[facility] [address], [place] ([phone]) [phone]   °[facility] [address] [place] [address], [place] or [address] ([phone]) [phone] °(336) [phone]   °[facility] [address],  (336) [phone], phone; ([phone]) [phone], fax Sees patients 1st and 3rd Saturday of every month.  Must not qualify for public or private insurance (i.e. Medicaid, Medicare, [place] Health Choice, Veterans' Benefits) • Household income should be no more than 200% of the poverty level •The clinic cannot treat you if you are pregnant or think you are pregnant •   Sexually transmitted diseases are not treated at the clinic.    Dental Care: Organization         Address  Phone  Notes  [facility] [phone] Accepts children up to age 89 who are enrolled in [place] or [place]; pregnant women with a Medicaid card; and children who have applied for Medicaid or [place] Health Choice, but were declined, whose parents can pay a reduced fee at time of service.  [facility]  [address], [place] [phone] Accepts children up to age 68 who are enrolled in [place] or [place]; pregnant women with a Medicaid card; and children who have applied for Medicaid or [place] Health Choice, but were declined, whose parents can pay a reduced fee at time of service.  [facility]  [place] [phone] Patients are seen by appointment only. Walk-ins are not accepted. [place] will see patients 75 years of age and older. Monday - Tuesday (8am-5pm) Most Wednesdays (8:30-5pm) $30 per visit, cash only  [facility]  [address], [facility] [phone] Patients are seen by appointment only. Walk-ins are not accepted. [place] will see patients 49 years of age and older. One Wednesday Evening (Monthly: Volunteer Based).  $30 per visit, cash only  [place]  [phone] for adults; Children under age 73, call Graduate Pediatric Dentistry at [phone]. Children aged 64-14, please call [phone] to request a pediatric application.  Dental services are provided in all areas of dental care including fillings, crowns and bridges, complete and partial dentures, implants, gum treatment, root canals, and extractions. Preventive care is also provided. Treatment is provided to both adults and children. Patients are selected via a lottery and there is often a waiting list.   [facility] [address] [place]  [phone] [email or web address]   [facility] [address] [place], [place] [phone], Ext. [phone] Second and Fourth Thursday of each month, opens at 6:30 AM; Clinic ends at 9 AM.  Patients are seen on a first-come first-served basis, and a limited number are seen during each clinic.   [facility]  [address] [place], [place] [phone]   Eligibility Requirements You must have lived in [place], [place], or [place] counties for at least the last three months.   You cannot be eligible for state or federal sponsored [employer], including [employer], [place], or [employer].   You generally cannot be eligible for healthcare insurance through your employer.    How to apply: Eligibility screenings are held every Tuesday and Wednesday afternoon  from 1:00 pm until 4:00 pm. You do not need an appointment for the interview!  [facility] [address], [place], [place]   [place]  [place]  [phone]

## 2015-02-27 NOTE — ED Provider Notes (Signed)
CSN: [id number]     Arrival date & time 02/27/15  0016 History   First MD Initiated Contact with Patient 02/27/15 [phone]     Chief Complaint  Patient presents with  . Back Pain    (Consider location/radiation/quality/duration/timing/severity/associated sxs/prior Treatment) HPI Comments: 54 year old male with a history of hypertension, ruptured lumbar disc, and chronic back pain presents to the emergency department for further evaluation of pain to his right low back and right hip. Patient states that pain is worse with ambulation and certain movements. He states that he has been taking tramadol and Flexeril, but these medications are no longer helping him. He states that the pain will intermittently radiate down his right leg. He reports that this pain is consistent with past exacerbations of his known chronic back pain. No new trauma or injury to his back since patient was last seen 3 days ago. He denies any associated fever, nausea, vomiting, bowel/bladder incontinence, history of cancer, history of IV drug use, or lower extremity numbness/weakness. Patient reports that he has follow-up with a primary care doctor in 2 days for evaluation of his back pain.  Patient is a 54 y.o. male presenting with back pain. The history is provided by the patient. No language interpreter was used.  Back Pain   Past Medical History  Diagnosis Date  . Hypertension   . Ruptured lumbar disc   . Back pain, chronic    Past Surgical History  Procedure Laterality Date  . Back surgery    . Abdominal surgery     Family History  Problem Relation Age of Onset  . Diabetes Mother   . Diabetes Sister   . Diabetes Brother    History  Substance Use Topics  . Smoking status: Current Some Day Smoker    Types: Cigarettes  . Smokeless tobacco: Not on file  . Alcohol Use: No    Review of Systems  Musculoskeletal: Positive for back pain and arthralgias.  All other systems reviewed and are negative.   Allergies   Lisinopril  Home Medications   Prior to Admission medications   Medication Sig Start Date End Date Taking? Authorizing Provider  amLODipine (NORVASC) 5 MG tablet Take 5 mg by mouth daily.    Historical Provider, MD  cyclobenzaprine (FLEXERIL) 10 MG tablet Take 1 tablet (10 mg total) by mouth 3 (three) times daily as needed for muscle spasms. 08/25/14   Lance Bosch, NP  esomeprazole (NEXIUM) 40 MG capsule Take 40 mg by mouth daily at 12 noon.    Historical Provider, MD  hydrochlorothiazide (HYDRODIURIL) 25 MG tablet Take 25 mg by mouth daily.    Historical Provider, MD  meloxicam (MOBIC) 15 MG tablet Take 1 tablet (15 mg total) by mouth daily. 02/27/15   Antonietta Breach, PA-C  metFORMIN (GLUCOPHAGE) 500 MG tablet Take 1 tablet (500 mg total) by mouth 2 (two) times daily with a meal. 01/14/15   Montine Circle, PA-C  methocarbamol (ROBAXIN) 500 MG tablet Take 2 tablets (1,000 mg total) by mouth 4 (four) times daily. 02/24/15   Carlisle Cater, PA-C  naproxen sodium (ANAPROX) 220 MG tablet Take 440 mg by mouth daily as needed (pain).    Historical Provider, MD  traMADol (ULTRAM) 50 MG tablet Take 1 tablet (50 mg total) by mouth every 6 (six) hours as needed. 02/24/15   Carlisle Cater, PA-C   BP 139/94 mmHg  Pulse 76  Temp(Src) 98.2 F (36.8 C) (Oral)  Resp 18  Ht 5\' 11"  (1.803 m)  Wt 173 lb (78.472 kg)  BMI 24.14 kg/m2  SpO2 99%   Physical Exam  Constitutional: He is oriented to person, place, and time. He appears well-developed and well-nourished. No distress.  Nontoxic/nonseptic appearing  HENT:  Head: Normocephalic and atraumatic.  Eyes: Conjunctivae and EOM are normal. No scleral icterus.  Neck: Normal range of motion.  Cardiovascular: Normal rate, regular rhythm and intact distal pulses.   DP and PT pulses 2+ bilaterally.  Pulmonary/Chest: Effort normal. No respiratory distress.  Respirations even and unlabored  Musculoskeletal: Normal range of motion. He exhibits tenderness.  No bony  deformities, step-offs, or crepitus to the lumbar midline. Patient has mild right-sided paraspinal muscle tenderness to the lumbar paraspinal muscles. No appreciable spasm. Negative straight leg raise and cross straight leg raise.  Neurological: He is alert and oriented to person, place, and time. He exhibits normal muscle tone. Coordination normal.  GCS 15. Sensation to light touch intact in all extremities. Patient ambulatory with antalgic gait.  Skin: Skin is warm and dry. No rash noted. He is not diaphoretic. No erythema. No pallor.  Psychiatric: He has a normal mood and affect. His behavior is normal.  Nursing note and vitals reviewed.   ED Course  Procedures (including critical care time) Labs Review Labs Reviewed - No data to display  Imaging Review No results found.   EKG Interpretation None      MDM   Final diagnoses:  Chronic sciatica of right side    Patient with back pain x 1 week. Hx of same and evaluated x 2 in an ED setting for this 3 days ago. No evolution of symptoms since last seen. Patient is neurovascularly intact. Patient can walk but states is painful. No loss of bowel or bladder control. No concern for cauda equina. No fever, history of cancer, or history of IV drug use. RICE protocol and pain medicine indicated and discussed with patient. Will add short course of Mobic as patient with relatively well preserved creatinine in May. Patient advised to f/u with PCP; notified patient that the ED is not appropriate for the management of chronic pain. Return precautions discussed and provided. Resource guide given. Patient agreeable to plan with no unaddressed concerns. Patient discharged in good condition; VSS.   Filed Vitals:   02/27/15 0031 02/27/15 0428  BP: 144/96 139/94  Pulse: 73 76  Temp: 98.2 F (36.8 C)   TempSrc: Oral   Resp: 20 18  Height: 5\' 11"  (1.803 m)   Weight: 173 lb (78.472 kg)   SpO2: 98% 99%     Antonietta Breach, PA-C 02/27/15  3149  Merryl Hacker, MD 02/27/15 [phone]

## 2015-02-27 NOTE — ED Notes (Signed)
Pt. reports chronic right low back pain radiating to right hip worse these past several days unrelieved by prescription Flexeril , denies recent fall or injury , ambulatory / no urinary discomfort.

## 2015-03-14 ENCOUNTER — Ambulatory Visit: Payer: Self-pay | Attending: Internal Medicine | Admitting: Internal Medicine

## 2015-03-14 ENCOUNTER — Encounter: Payer: Self-pay | Admitting: Internal Medicine

## 2015-03-14 ENCOUNTER — Ambulatory Visit (HOSPITAL_BASED_OUTPATIENT_CLINIC_OR_DEPARTMENT_OTHER): Payer: Self-pay | Admitting: Cardiology

## 2015-03-14 VITALS — Ht 71.0 in | Wt 176.4 lb

## 2015-03-14 VITALS — BP 110/74 | HR 82 | Resp 16 | Wt 176.0 lb

## 2015-03-14 DIAGNOSIS — B351 Tinea unguium: Secondary | ICD-10-CM

## 2015-03-14 DIAGNOSIS — IMO0002 Reserved for concepts with insufficient information to code with codable children: Secondary | ICD-10-CM | POA: Insufficient documentation

## 2015-03-14 DIAGNOSIS — I10 Essential (primary) hypertension: Secondary | ICD-10-CM | POA: Insufficient documentation

## 2015-03-14 DIAGNOSIS — E1165 Type 2 diabetes mellitus with hyperglycemia: Secondary | ICD-10-CM

## 2015-03-14 DIAGNOSIS — B353 Tinea pedis: Secondary | ICD-10-CM

## 2015-03-14 LAB — GLUCOSE, POCT (MANUAL RESULT ENTRY): POC Glucose: 249 mg/dl — AB (ref 70–99)

## 2015-03-14 LAB — POCT GLYCOSYLATED HEMOGLOBIN (HGB A1C): Hemoglobin A1C: >15

## 2015-03-14 MED ORDER — TERBINAFINE HCL 1 % EX CREA: 1.0000 "application " | TOPICAL_CREAM | Freq: Two times a day (BID) | CUTANEOUS | Status: DC

## 2015-03-14 MED ORDER — METFORMIN HCL 1000 MG PO TABS: 1000.0000 mg | ORAL_TABLET | Freq: Two times a day (BID) | ORAL | Status: DC

## 2015-03-14 NOTE — Progress Notes (Signed)
  New patient here to established care. Pt present today for Diabetes and back pain.

## 2015-03-14 NOTE — Progress Notes (Signed)
Patient ID: Ryan Medina, male   DOB: April 30, 1961, 54 y.o.   MRN: [id number]  CC: DM f/u  HPI: Ryan Medina is a 54 y.o. male here today for a follow up visit.  Patient has past medical history of HTN and T2DM.  Patient was recently seen in the ER on 01/19/15 and was given a new diagnoses of type 2 diabetes mellitus. He was started on Metformin 500 mg twice daily. He has since been taking the medication without complications. He received a glucometer and has been checking his sugars twice per day. His cbg readings have been between 188-263.   Allergies  Allergen Reactions  . Lisinopril Swelling   Past Medical History  Diagnosis Date  . Hypertension   . Ruptured lumbar disc   . Back pain, chronic   . Diabetes mellitus without complication    Current Outpatient Prescriptions on File Prior to Visit  Medication Sig Dispense Refill  . amLODipine (NORVASC) 5 MG tablet Take 5 mg by mouth daily.    . cyclobenzaprine (FLEXERIL) 10 MG tablet Take 1 tablet (10 mg total) by mouth 3 (three) times daily as needed for muscle spasms. 60 tablet 1  . esomeprazole (NEXIUM) 40 MG capsule Take 40 mg by mouth daily at 12 noon.    . hydrochlorothiazide (HYDRODIURIL) 25 MG tablet Take 25 mg by mouth daily.    . meloxicam (MOBIC) 15 MG tablet Take 1 tablet (15 mg total) by mouth daily. 5 tablet 0  . metFORMIN (GLUCOPHAGE) 500 MG tablet Take 1 tablet (500 mg total) by mouth 2 (two) times daily with a meal. 60 tablet 1  . methocarbamol (ROBAXIN) 500 MG tablet Take 2 tablets (1,000 mg total) by mouth 4 (four) times daily. 20 tablet 0  . naproxen sodium (ANAPROX) 220 MG tablet Take 440 mg by mouth daily as needed (pain).    . traMADol (ULTRAM) 50 MG tablet Take 1 tablet (50 mg total) by mouth every 6 (six) hours as needed. 15 tablet 0   No current facility-administered medications on file prior to visit.   Family History  Problem Relation Age of Onset  . Diabetes Mother   . Diabetes Sister   . Diabetes Brother     History   Social History  . Marital Status: Single    Spouse Name: N/A  . Number of Children: N/A  . Years of Education: N/A   Occupational History  . Not on file.   Social History Main Topics  . Smoking status: Current Some Day Smoker    Types: Cigarettes  . Smokeless tobacco: Not on file  . Alcohol Use: No  . Drug Use: No  . Sexual Activity: Not on file   Other Topics Concern  . Not on file   Social History Narrative    Review of Systems  Eyes: Positive for blurred vision.  Genitourinary: Positive for frequency.  Neurological: Negative for headaches.  Endo/Heme/Allergies: Positive for polydipsia.  All other systems reviewed and are negative.     Objective:   Filed Vitals:   03/14/15 1549  BP: 110/74  Pulse: 82  Resp: 16    Physical Exam  Constitutional: He is oriented to person, place, and time.  Cardiovascular: Normal rate, regular rhythm and normal heart sounds.   Pulmonary/Chest: Effort normal and breath sounds normal.  Abdominal: Soft. Bowel sounds are normal.  Musculoskeletal: He exhibits no edema or tenderness.  Neurological: He is alert and oriented to person, place, and time.  Skin:  Tinea pedis--bilaterally Onychomycosis-severe bilaterally  Ingrown long thick toenails     Lab Results  Component Value Date   WBC 6.6 01/21/2015   HGB 12.8* 01/21/2015   HCT 38.4* 01/21/2015   MCV 88.1 01/21/2015   PLT 333 01/21/2015   Lab Results  Component Value Date   CREATININE 1.32* 01/21/2015   BUN 16 01/21/2015   NA 134* 01/21/2015   K 4.2 01/21/2015   CL 96* 01/21/2015   CO2 26 01/21/2015    Lab Results  Component Value Date   HGBA1C >15.0 03/14/2015   Lipid Panel  No results found for: CHOL, TRIG, HDL, CHOLHDL, VLDL, LDLCALC     Assessment and plan:   Ryan Medina was seen today for new patient.  Diagnoses and all orders for this visit:  DM (diabetes mellitus), type 2, uncontrolled Orders: -     POCT glycosylated hemoglobin (Hb  A1C) -     Glucose (CBG) -     Amb Referral to Nutrition and Diabetic E -     Ambulatory referral to Podiatry -     Increased metFORMIN (GLUCOPHAGE) 1000 MG tablet; Take 1 tablet (1,000 mg total) by mouth 2 (two) times daily with a meal. -     Microalbumin, urine -     Lipid panel; Future -     Basic Metabolic Panel; Future Patient is a new diagnosis and cbg reading are still elevated. I will increase Metformin and have him to continue to write down sugar readings for review at nurse visit.  Essential hypertension Patient reports that he takes Amlodipine and HCTZ that he gets from the provider at the [facility]. His BP is well controlled may continue regimen. Patient had angioedema from Lisinopril.   Onychomycosis Orders: -     Ambulatory referral to Podiatry Patient will likely need nail removal for healing. Will let him see podiatry before treating. Explained to keep feet dry.   Tinea pedis of both feet Orders: -     terbinafine (LAMISIL AT) 1 % cream; Apply 1 application topically 2 (two) times daily. For foot fungus Will likely switch to PO Lamisil after seeing podiatry.    Return in about 3 weeks (around 04/04/2015) for cbg log review-RN and 3 mo PCP and this week lab visit.        Chari Manning, NP-C [facility] [phone] 03/14/2015, 4:27 PM

## 2015-03-14 NOTE — Progress Notes (Signed)
Patient here for Ed follow up. Patient has diabetes and high blood pressure. Patient does not check BP regularly.

## 2015-03-14 NOTE — Patient Instructions (Addendum)
I have changed your dose of Metformin. Please pick up new prescription   Diabetes and Foot Care Diabetes may cause you to have problems because of poor blood supply (circulation) to your feet and legs. This may cause the skin on your feet to become thinner, break easier, and heal more slowly. Your skin may become dry, and the skin may peel and crack. You may also have nerve damage in your legs and feet causing decreased feeling in them. You may not notice minor injuries to your feet that could lead to infections or more serious problems. Taking care of your feet is one of the most important things you can do for yourself.  HOME CARE INSTRUCTIONS  Wear shoes at all times, even in the house. Do not go barefoot. Bare feet are easily injured.  Check your feet daily for blisters, cuts, and redness. If you cannot see the bottom of your feet, use a mirror or ask someone for help.  Wash your feet with warm water (do not use hot water) and mild soap. Then pat your feet and the areas between your toes until they are completely dry. Do not soak your feet as this can dry your skin.  Apply a moisturizing lotion or petroleum jelly (that does not contain alcohol and is unscented) to the skin on your feet and to dry, brittle toenails. Do not apply lotion between your toes.  Trim your toenails straight across. Do not dig under them or around the cuticle. File the edges of your nails with an emery board or nail file.  Do not cut corns or calluses or try to remove them with medicine.  Wear clean socks or stockings every day. Make sure they are not too tight. Do not wear knee-high stockings since they may decrease blood flow to your legs.  Wear shoes that fit properly and have enough cushioning. To break in new shoes, wear them for just a few hours a day. This prevents you from injuring your feet. Always look in your shoes before you put them on to be sure there are no objects inside.  Do not cross your legs. This  may decrease the blood flow to your feet.  If you find a minor scrape, cut, or break in the skin on your feet, keep it and the skin around it clean and dry. These areas may be cleansed with mild soap and water. Do not cleanse the area with peroxide, alcohol, or iodine.  When you remove an adhesive bandage, be sure not to damage the skin around it.  If you have a wound, look at it several times a day to make sure it is healing.  Do not use heating pads or hot water bottles. They may burn your skin. If you have lost feeling in your feet or legs, you may not know it is happening until it is too late.  Make sure your health care provider performs a complete foot exam at least annually or more often if you have foot problems. Report any cuts, sores, or bruises to your health care provider immediately. SEEK MEDICAL CARE IF:   You have an injury that is not healing.  You have cuts or breaks in the skin.  You have an ingrown nail.  You notice redness on your legs or feet.  You feel burning or tingling in your legs or feet.  You have pain or cramps in your legs and feet.  Your legs or feet are numb.  Your feet  always feel cold. SEEK IMMEDIATE MEDICAL CARE IF:   There is increasing redness, swelling, or pain in or around a wound.  There is a red line that goes up your leg.  Pus is coming from a wound.  You develop a fever or as directed by your health care provider.  You notice a bad smell coming from an ulcer or wound. Document Released: 09/05/2000 Document Revised: 05/11/2013 Document Reviewed: 02/15/2013 [facility] Patient Information 2015 [place], [place]. This information is not intended to replace advice given to you by your health care provider. Make sure you discuss any questions you have with your health care provider.

## 2015-03-15 LAB — MICROALBUMIN, URINE: Microalb, Ur: 3.3 mg/dL — ABNORMAL HIGH (ref <2.0)

## 2015-07-17 ENCOUNTER — Encounter (HOSPITAL_COMMUNITY): Payer: Self-pay | Admitting: Emergency Medicine

## 2015-07-17 DIAGNOSIS — M25512 Pain in left shoulder: Secondary | ICD-10-CM | POA: Insufficient documentation

## 2015-07-17 DIAGNOSIS — K2970 Gastritis, unspecified, without bleeding: Secondary | ICD-10-CM | POA: Insufficient documentation

## 2015-07-17 DIAGNOSIS — Z720 Tobacco use: Secondary | ICD-10-CM | POA: Insufficient documentation

## 2015-07-17 DIAGNOSIS — M542 Cervicalgia: Secondary | ICD-10-CM | POA: Insufficient documentation

## 2015-07-17 DIAGNOSIS — Z7984 Long term (current) use of oral hypoglycemic drugs: Secondary | ICD-10-CM | POA: Insufficient documentation

## 2015-07-17 DIAGNOSIS — E119 Type 2 diabetes mellitus without complications: Secondary | ICD-10-CM | POA: Insufficient documentation

## 2015-07-17 DIAGNOSIS — G8929 Other chronic pain: Secondary | ICD-10-CM | POA: Insufficient documentation

## 2015-07-17 DIAGNOSIS — I10 Essential (primary) hypertension: Secondary | ICD-10-CM | POA: Insufficient documentation

## 2015-07-17 DIAGNOSIS — T39395A Adverse effect of other nonsteroidal anti-inflammatory drugs [NSAID], initial encounter: Secondary | ICD-10-CM | POA: Insufficient documentation

## 2015-07-17 DIAGNOSIS — Z79899 Other long term (current) drug therapy: Secondary | ICD-10-CM | POA: Insufficient documentation

## 2015-07-17 LAB — COMPREHENSIVE METABOLIC PANEL
ALT: 19 U/L (ref 17–63)
AST: 16 U/L (ref 15–41)
Albumin: 3.5 g/dL (ref 3.5–5.0)
Alkaline Phosphatase: 67 U/L (ref 38–126)
Anion gap: 6 (ref 5–15)
BUN: 18 mg/dL (ref 6–20)
CHLORIDE: 101 mmol/L (ref 101–111)
CO2: 28 mmol/L (ref 22–32)
Calcium: 9 mg/dL (ref 8.9–10.3)
Creatinine, Ser: 1 mg/dL (ref 0.61–1.24)
GFR calc non Af Amer: >60 mL/min (ref >60)
Glucose, Bld: 291 mg/dL — ABNORMAL HIGH (ref 65–99)
POTASSIUM: 4.3 mmol/L (ref 3.5–5.1)
Sodium: 135 mmol/L (ref 135–145)
Total Bilirubin: 0.4 mg/dL (ref 0.3–1.2)
Total Protein: 6.7 g/dL (ref 6.5–8.1)

## 2015-07-17 LAB — URINALYSIS, ROUTINE W REFLEX MICROSCOPIC
Bilirubin Urine: NEGATIVE
Glucose, UA: >1000 mg/dL — AB
Hgb urine dipstick: NEGATIVE
Ketones, ur: NEGATIVE mg/dL
LEUKOCYTES UA: NEGATIVE
NITRITE: NEGATIVE
PH: 6.5 (ref 5.0–8.0)
Protein, ur: NEGATIVE mg/dL
SPECIFIC GRAVITY, URINE: 1.031 — AB (ref 1.005–1.030)
Urobilinogen, UA: 1 mg/dL (ref 0.0–1.0)

## 2015-07-17 LAB — CBC
HCT: 40.9 % (ref 39.0–52.0)
Hemoglobin: 13.7 g/dL (ref 13.0–17.0)
MCH: 29.3 pg (ref 26.0–34.0)
MCHC: 33.5 g/dL (ref 30.0–36.0)
MCV: 87.4 fL (ref 78.0–100.0)
Platelets: 271 10*3/uL (ref 150–400)
RBC: 4.68 MIL/uL (ref 4.22–5.81)
RDW: 12.5 % (ref 11.5–15.5)
WBC: 7 10*3/uL (ref 4.0–10.5)

## 2015-07-17 LAB — URINE MICROSCOPIC-ADD ON

## 2015-07-17 LAB — LIPASE, BLOOD: LIPASE: 46 U/L (ref 11–51)

## 2015-07-17 NOTE — ED Notes (Signed)
Pt. reports intermittent LUQ pain for 1 1/2 weeks , denies nausea and vomitting , no diarrhea or fever , pt. added intermittent posterior neck pain radiating to left upper arm .

## 2015-07-18 ENCOUNTER — Emergency Department (HOSPITAL_COMMUNITY)
Admission: EM | Admit: 2015-07-18 | Discharge: 2015-07-18 | Disposition: A | Discharge status: Home / Self Care | Payer: Self-pay | Attending: Emergency Medicine | Admitting: Emergency Medicine

## 2015-07-18 DIAGNOSIS — K2960 Other gastritis without bleeding: Secondary | ICD-10-CM

## 2015-07-18 DIAGNOSIS — T39395A Adverse effect of other nonsteroidal anti-inflammatory drugs [NSAID], initial encounter: Secondary | ICD-10-CM

## 2015-07-18 DIAGNOSIS — M25512 Pain in left shoulder: Secondary | ICD-10-CM

## 2015-07-18 MED ORDER — PANTOPRAZOLE SODIUM 40 MG PO TBEC
40.0000 mg | DELAYED_RELEASE_TABLET | Freq: Once | ORAL | Status: AC
  Administered 2015-07-18: 40 mg via ORAL
  Filled 2015-07-18: qty 1

## 2015-07-18 MED ORDER — GI COCKTAIL ~~LOC~~
30.0000 mL | Freq: Once | ORAL | Status: AC
  Administered 2015-07-18: 30 mL via ORAL
  Filled 2015-07-18: qty 30

## 2015-07-18 MED ORDER — TRAMADOL HCL 50 MG PO TABS: 50.0000 mg | ORAL_TABLET | Freq: Four times a day (QID) | ORAL | Status: DC | PRN

## 2015-07-18 MED ORDER — METHOCARBAMOL 500 MG PO TABS
1000.0000 mg | ORAL_TABLET | Freq: Once | ORAL | Status: AC
  Administered 2015-07-18: 1000 mg via ORAL
  Filled 2015-07-18: qty 2

## 2015-07-18 MED ORDER — ESOMEPRAZOLE MAGNESIUM 40 MG PO CPDR: 40.0000 mg | DELAYED_RELEASE_CAPSULE | Freq: Every day | ORAL | Status: DC

## 2015-07-18 MED ORDER — SUCRALFATE 1 G PO TABS: 1.0000 g | ORAL_TABLET | Freq: Two times a day (BID) | ORAL | Status: DC | PRN

## 2015-07-18 NOTE — Discharge Instructions (Signed)
Stop using all NSAIDs including ibuprofen, naproxen, mobic. Take Nexium daily. He may use over-the-counter Maalox or Mylanta for upper abdominal pain. May also use Carafate as needed for abdominal pain. Follow-up with gastroenterology should your symptoms persist. Return immediately for any dark, tarry stools or vomiting blood. You may continue to take Ultram and Tylenol as needed for the left shoulder pain. If it persists follow up with the orthopedist.  Gastritis, Adult Gastritis is soreness and swelling (inflammation) of the lining of the stomach. Gastritis can develop as a sudden onset (acute) or long-term (chronic) condition. If gastritis is not treated, it can lead to stomach bleeding and ulcers. CAUSES  Gastritis occurs when the stomach lining is weak or damaged. Digestive juices from the stomach then inflame the weakened stomach lining. The stomach lining may be weak or damaged due to viral or bacterial infections. One common bacterial infection is the Helicobacter pylori infection. Gastritis can also result from excessive alcohol consumption, taking certain medicines, or having too much acid in the stomach.  SYMPTOMS  In some cases, there are no symptoms. When symptoms are present, they may include:  Pain or a burning sensation in the upper abdomen.  Nausea.  Vomiting.  An uncomfortable feeling of fullness after eating. DIAGNOSIS  Your caregiver may suspect you have gastritis based on your symptoms and a physical exam. To determine the cause of your gastritis, your caregiver may perform the following:  Blood or stool tests to check for the H pylori bacterium.  Gastroscopy. A thin, flexible tube (endoscope) is passed down the esophagus and into the stomach. The endoscope has a light and camera on the end. Your caregiver uses the endoscope to view the inside of the stomach.  Taking a tissue sample (biopsy) from the stomach to examine under a microscope. TREATMENT  Depending on the  cause of your gastritis, medicines may be prescribed. If you have a bacterial infection, such as an H pylori infection, antibiotics may be given. If your gastritis is caused by too much acid in the stomach, H2 blockers or antacids may be given. Your caregiver may recommend that you stop taking aspirin, ibuprofen, or other nonsteroidal anti-inflammatory drugs (NSAIDs). HOME CARE INSTRUCTIONS  Only take over-the-counter or prescription medicines as directed by your caregiver.  If you were given antibiotic medicines, take them as directed. Finish them even if you start to feel better.  Drink enough fluids to keep your urine clear or pale yellow.  Avoid foods and drinks that make your symptoms worse, such as:  Caffeine or alcoholic drinks.  Chocolate.  Peppermint or mint flavorings.  Garlic and onions.  Spicy foods.  Citrus fruits, such as oranges, lemons, or limes.  Tomato-based foods such as sauce, chili, salsa, and pizza.  Fried and fatty foods.  Eat small, frequent meals instead of large meals. SEEK IMMEDIATE MEDICAL CARE IF:   You have black or dark red stools.  You vomit blood or material that looks like coffee grounds.  You are unable to keep fluids down.  Your abdominal pain gets worse.  You have a fever.  You do not feel better after 1 week.  You have any other questions or concerns. MAKE SURE YOU:  Understand these instructions.  Will watch your condition.  Will get help right away if you are not doing well or get worse.   This information is not intended to replace advice given to you by your health care provider. Make sure you discuss any questions you have with  your health care provider.   Document Released: 09/02/2001 Document Revised: 03/09/2012 Document Reviewed: 10/22/2011 Elsevier Interactive Patient Education 2016 [employer]  Shoulder Pain The shoulder is the joint that connects your arms to your body. The bones that form the shoulder joint  include the upper arm bone (humerus), the shoulder blade (scapula), and the collarbone (clavicle). The top of the humerus is shaped like a ball and fits into a rather flat socket on the scapula (glenoid cavity). A combination of muscles and strong, fibrous tissues that connect muscles to bones (tendons) support your shoulder joint and hold the ball in the socket. Small, fluid-filled sacs (bursae) are located in different areas of the joint. They act as cushions between the bones and the overlying soft tissues and help reduce friction between the gliding tendons and the bone as you move your arm. Your shoulder joint allows a wide range of motion in your arm. This range of motion allows you to do things like scratch your back or throw a ball. However, this range of motion also makes your shoulder more prone to pain from overuse and injury. Causes of shoulder pain can originate from both injury and overuse and usually can be grouped in the following four categories:  Redness, swelling, and pain (inflammation) of the tendon (tendinitis) or the bursae (bursitis).  Instability, such as a dislocation of the joint.  Inflammation of the joint (arthritis).  Broken bone (fracture). HOME CARE INSTRUCTIONS   Apply ice to the sore area.  Put ice in a plastic bag.  Place a towel between your skin and the bag.  Leave the ice on for 15-20 minutes, 3-4 times per day for the first 2 days, or as directed by your health care provider.  Stop using cold packs if they do not help with the pain.  If you have a shoulder sling or immobilizer, wear it as long as your caregiver instructs. Only remove it to shower or bathe. Move your arm as little as possible, but keep your hand moving to prevent swelling.  Squeeze a soft ball or foam pad as much as possible to help prevent swelling.  Only take over-the-counter or prescription medicines for pain, discomfort, or fever as directed by your caregiver. SEEK MEDICAL CARE IF:    Your shoulder pain increases, or new pain develops in your arm, hand, or fingers.  Your hand or fingers become cold and numb.  Your pain is not relieved with medicines. SEEK IMMEDIATE MEDICAL CARE IF:   Your arm, hand, or fingers are numb or tingling.  Your arm, hand, or fingers are significantly swollen or turn white or blue. MAKE SURE YOU:   Understand these instructions.  Will watch your condition.  Will get help right away if you are not doing well or get worse.   This information is not intended to replace advice given to you by your health care provider. Make sure you discuss any questions you have with your health care provider.   Document Released: 06/18/2005 Document Revised: 09/29/2014 Document Reviewed: 01/01/2015 Elsevier Interactive Patient Education [employer].

## 2015-07-18 NOTE — ED Provider Notes (Signed)
CSN: [id number]     Arrival date & time 07/17/15  2210 History  By signing my name below, I, Meriel Pica, attest that this documentation has been prepared under the direction and in the presence of Julianne Rice, MD. Electronically Signed: Meriel Pica, ED Scribe. 07/18/2015. 1:59 AM.   Chief Complaint  Patient presents with  . Abdominal Pain  . Neck Pain   The history is provided by the patient. No language interpreter was used.   HPI Comments: Ryan Medina is a 54 y.o. male, with a PMhx of HTN, DM and ruptured lumbar disc with chronic back pain, who presents to the Emergency Department complaining of intermittent posterior left shoulder pain that radiates down his upper left arm onset 1 week ago but that worsened to a constant pain today. His pain is worse with abduction of left arm. He has been taking 2-3 of 800mg  ibuprofen daily for 1 week. Denies numbness or swelling in left arm or any injury attributable to pain. The pt additionally c/o intermittent LUQ abdominal pain onset 1 day ago after eating spaghetti. He describes this pain to feel like GERD. Pt has taken pepcid without significant relief. Denies melena or hematochezia, nausea or vomiting.    Past Medical History  Diagnosis Date  . Hypertension   . Ruptured lumbar disc   . Back pain, chronic   . Diabetes mellitus without complication [facility])    Past Surgical History  Procedure Laterality Date  . Back surgery    . Abdominal surgery     Family History  Problem Relation Age of Onset  . Diabetes Mother   . Diabetes Sister   . Diabetes Brother    Social History  Substance Use Topics  . Smoking status: Current Some Day Smoker    Types: Cigarettes  . Smokeless tobacco: None  . Alcohol Use: No    Review of Systems  Constitutional: Negative for fever and chills.  Respiratory: Negative for shortness of breath.   Cardiovascular: Negative for chest pain and leg swelling.  Gastrointestinal: Positive for abdominal  pain. Negative for nausea, vomiting, diarrhea, constipation and blood in stool.  Musculoskeletal: Positive for myalgias and arthralgias. Negative for joint swelling, neck pain and neck stiffness.  Skin: Negative for rash and wound.  Neurological: Negative for dizziness, weakness, light-headedness and numbness.  All other systems reviewed and are negative.  Allergies  Lisinopril  Home Medications   Prior to Admission medications   Medication Sig Start Date End Date Taking? Authorizing Provider  amLODipine (NORVASC) 5 MG tablet Take 5 mg by mouth daily.    Historical Provider, MD  cyclobenzaprine (FLEXERIL) 10 MG tablet Take 1 tablet (10 mg total) by mouth 3 (three) times daily as needed for muscle spasms. 08/25/14   Lance Bosch, NP  esomeprazole (NEXIUM) 40 MG capsule Take 1 capsule (40 mg total) by mouth daily at 12 noon. 07/18/15   Julianne Rice, MD  hydrochlorothiazide (HYDRODIURIL) 25 MG tablet Take 25 mg by mouth daily.    Historical Provider, MD  metFORMIN (GLUCOPHAGE) 1000 MG tablet Take 1 tablet (1,000 mg total) by mouth 2 (two) times daily with a meal. 03/14/15   Lance Bosch, NP  methocarbamol (ROBAXIN) 500 MG tablet Take 2 tablets (1,000 mg total) by mouth 4 (four) times daily. 02/24/15   Carlisle Cater, PA-C  sucralfate (CARAFATE) 1 G tablet Take 1 tablet (1 g total) by mouth 2 (two) times daily as needed (abdominal pain). 07/18/15   Julianne Rice, MD  terbinafine (LAMISIL AT) 1 % cream Apply 1 application topically 2 (two) times daily. For foot fungus 03/14/15   Lance Bosch, NP  traMADol (ULTRAM) 50 MG tablet Take 1 tablet (50 mg total) by mouth every 6 (six) hours as needed. 07/18/15   Julianne Rice, MD   BP 137/104 mmHg  Pulse 70  Temp(Src) 98.1 F (36.7 C) (Oral)  Resp 16  Ht 5\' 11"  (1.803 m)  Wt 185 lb (83.915 kg)  BMI 25.81 kg/m2  SpO2 99% Physical Exam  Constitutional: He is oriented to person, place, and time. He appears well-developed and well-nourished.  No distress.  HENT:  Head: Normocephalic and atraumatic.  Mouth/Throat: Oropharynx is clear and moist.  Eyes: EOM are normal. Pupils are equal, round, and reactive to light.  Neck: Normal range of motion. Neck supple.  No posterior midline cervical tenderness to palpation.   Cardiovascular: Normal rate and regular rhythm.  Exam reveals no gallop and no friction rub.   No murmur heard. Pulmonary/Chest: Effort normal and breath sounds normal. No respiratory distress. He has no wheezes. He has no rales. He exhibits no tenderness.  Abdominal: Soft. Bowel sounds are normal. He exhibits no distension and no mass. There is tenderness. There is no rebound (epigastric tenderness with palpation) and no guarding.  Musculoskeletal: Normal range of motion. He exhibits no edema or tenderness.  Patient has mild tenderness to palpation of the left trapezius muscles. He has full range of motion of the left shoulder without deformity, evidence of injury or swelling. He is rest pain at the left shoulder with abduction. 2+ radial pulses bilaterally. No muscular tenderness to palpation of the left upper extremity.  Neurological: He is alert and oriented to person, place, and time.  5/5 grip strength bilaterally. 5/5 bilateral upper extremity abduction and abduction. Normal sensation throughout  Skin: Skin is warm and dry. No rash noted. No erythema.  Psychiatric: He has a normal mood and affect. His behavior is normal.  Nursing note and vitals reviewed.   ED Course  Procedures  DIAGNOSTIC STUDIES: Oxygen Saturation is 99% on RA, normal by my interpretation.    COORDINATION OF CARE: 1:37 AM Discussed treatment plan with pt at bedside and pt agreed to plan.  Labs Review Labs Reviewed  COMPREHENSIVE METABOLIC PANEL - Abnormal; Notable for the following:    Glucose, Bld 291 (*)    All other components within normal limits  URINALYSIS, ROUTINE W REFLEX MICROSCOPIC (NOT AT [facility]) - Abnormal; Notable for the  following:    Specific Gravity, Urine 1.031 (*)    Glucose, UA >1000 (*)    All other components within normal limits  LIPASE, BLOOD  CBC  URINE MICROSCOPIC-ADD ON   I have personally reviewed and evaluated these lab results as part of my medical decision-making.   MDM   Final diagnoses:  Left shoulder pain  NSAID induced gastritis    I personally performed the services described in this documentation, which was scribed in my presence. The recorded information has been reviewed and is accurate.   Patient with ongoing left shoulder pain. No trauma. I do not believe imaging is necessary at this point. If symptoms persist will need follow-up with orthopedics. Patient has developed NSAID-induced gastritis. He is advised to discontinue all NSAIDs. He is to continue taking Nexium daily. Advised to take MiraLAX or Mylanta as needed for pain. He's been given return precautions and advised to follow up with gastrology should symptoms persist.   Julianne Rice, MD  07/18/15 0344 

## 2015-08-02 ENCOUNTER — Emergency Department (HOSPITAL_COMMUNITY)
Admission: EM | Admit: 2015-08-02 | Discharge: 2015-08-02 | Disposition: A | Discharge status: Home / Self Care | Payer: Self-pay | Attending: Emergency Medicine | Admitting: Emergency Medicine

## 2015-08-02 ENCOUNTER — Encounter (HOSPITAL_COMMUNITY): Payer: Self-pay | Admitting: Emergency Medicine

## 2015-08-02 DIAGNOSIS — E11649 Type 2 diabetes mellitus with hypoglycemia without coma: Secondary | ICD-10-CM | POA: Insufficient documentation

## 2015-08-02 DIAGNOSIS — G8929 Other chronic pain: Secondary | ICD-10-CM | POA: Insufficient documentation

## 2015-08-02 DIAGNOSIS — R739 Hyperglycemia, unspecified: Secondary | ICD-10-CM

## 2015-08-02 DIAGNOSIS — I10 Essential (primary) hypertension: Secondary | ICD-10-CM | POA: Insufficient documentation

## 2015-08-02 DIAGNOSIS — R631 Polydipsia: Secondary | ICD-10-CM | POA: Insufficient documentation

## 2015-08-02 DIAGNOSIS — Z8739 Personal history of other diseases of the musculoskeletal system and connective tissue: Secondary | ICD-10-CM | POA: Insufficient documentation

## 2015-08-02 DIAGNOSIS — N390 Urinary tract infection, site not specified: Secondary | ICD-10-CM | POA: Insufficient documentation

## 2015-08-02 DIAGNOSIS — Z79899 Other long term (current) drug therapy: Secondary | ICD-10-CM | POA: Insufficient documentation

## 2015-08-02 LAB — CBG MONITORING, ED
Glucose-Capillary: 268 mg/dL — ABNORMAL HIGH (ref 65–99)
Glucose-Capillary: 460 mg/dL — ABNORMAL HIGH (ref 65–99)
Glucose-Capillary: 101 mg/dL — ABNORMAL HIGH (ref 65–99)

## 2015-08-02 LAB — BASIC METABOLIC PANEL
Anion gap: 7 (ref 5–15)
BUN: 19 mg/dL (ref 6–20)
CALCIUM: 9.2 mg/dL (ref 8.9–10.3)
CO2: 29 mmol/L (ref 22–32)
CREATININE: 1.23 mg/dL (ref 0.61–1.24)
Chloride: 102 mmol/L (ref 101–111)
Glucose, Bld: 304 mg/dL — ABNORMAL HIGH (ref 65–99)
Potassium: 3.8 mmol/L (ref 3.5–5.1)
SODIUM: 138 mmol/L (ref 135–145)

## 2015-08-02 LAB — CBC
HCT: 41.3 % (ref 39.0–52.0)
Hemoglobin: 13.8 g/dL (ref 13.0–17.0)
MCH: 29.3 pg (ref 26.0–34.0)
MCHC: 33.4 g/dL (ref 30.0–36.0)
MCV: 87.7 fL (ref 78.0–100.0)
PLATELETS: 266 10*3/uL (ref 150–400)
RBC: 4.71 MIL/uL (ref 4.22–5.81)
RDW: 12.6 % (ref 11.5–15.5)
WBC: 7.8 10*3/uL (ref 4.0–10.5)

## 2015-08-02 LAB — URINALYSIS, ROUTINE W REFLEX MICROSCOPIC
Bilirubin Urine: NEGATIVE
Hgb urine dipstick: NEGATIVE
KETONES UR: 15 mg/dL — AB
LEUKOCYTES UA: NEGATIVE
Nitrite: NEGATIVE
PROTEIN: NEGATIVE mg/dL
Specific Gravity, Urine: 1.042 — ABNORMAL HIGH (ref 1.005–1.030)
UROBILINOGEN UA: 1 mg/dL (ref 0.0–1.0)
pH: 5 (ref 5.0–8.0)

## 2015-08-02 LAB — URINE MICROSCOPIC-ADD ON

## 2015-08-02 MED ORDER — DEXTROSE 5 % IV SOLN
1.0000 g | Freq: Once | INTRAVENOUS | Status: AC
  Administered 2015-08-02: 1 g via INTRAVENOUS
  Filled 2015-08-02: qty 10

## 2015-08-02 MED ORDER — INSULIN ASPART 100 UNIT/ML ~~LOC~~ SOLN
10.0000 [IU] | Freq: Once | SUBCUTANEOUS | Status: AC
  Administered 2015-08-02: 10 [IU] via INTRAVENOUS
  Filled 2015-08-02: qty 1

## 2015-08-02 MED ORDER — CEPHALEXIN 500 MG PO CAPS: 500.0000 mg | ORAL_CAPSULE | Freq: Four times a day (QID) | ORAL | Status: DC

## 2015-08-02 MED ORDER — SODIUM CHLORIDE 0.9 % IV BOLUS (SEPSIS)
1000.0000 mL | Freq: Once | INTRAVENOUS | Status: AC
Start: 2015-08-02 — End: 2015-08-02
  Administered 2015-08-02: 1000 mL via INTRAVENOUS

## 2015-08-02 MED ORDER — SODIUM CHLORIDE 0.9 % IV BOLUS (SEPSIS)
1000.0000 mL | Freq: Once | INTRAVENOUS | Status: AC
  Administered 2015-08-02: 1000 mL via INTRAVENOUS

## 2015-08-02 NOTE — ED Notes (Signed)
Pt. reports elevated blood sugar states dry mouth , urinary frequency and ran out of his insulin this week . Denies fever , no nausea or vomitting .
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
## 2015-08-02 NOTE — ED Notes (Signed)
Pt CBG 460 mg/dL

## 2015-08-02 NOTE — ED Notes (Signed)
CBG 101 

## 2015-08-02 NOTE — Discharge Instructions (Signed)
Hyperglycemia °Hyperglycemia occurs when the glucose (sugar) in your blood is too high. Hyperglycemia can happen for many reasons, but it most often happens to people who do not know they have diabetes or are not managing their diabetes properly.  °CAUSES  °Whether you have diabetes or not, there are other causes of hyperglycemia. Hyperglycemia can occur when you have diabetes, but it can also occur in other situations that you might not be as aware of, such as: °Diabetes °· If you have diabetes and are having problems controlling your blood glucose, hyperglycemia could occur because of some of the following reasons: °· Not following your meal plan. °· Not taking your diabetes medications or not taking it properly. °· Exercising less or doing less activity than you normally do. °· Being sick. °Pre-diabetes °· This cannot be ignored. Before people develop Type 2 diabetes, they almost always have "pre-diabetes." This is when your blood glucose levels are higher than normal, but not yet high enough to be diagnosed as diabetes. Research has shown that some long-term damage to the body, especially the heart and circulatory system, may already be occurring during pre-diabetes. If you take action to manage your blood glucose when you have pre-diabetes, you may delay or prevent Type 2 diabetes from developing. °Stress °· If you have diabetes, you may be "diet" controlled or on oral medications or insulin to control your diabetes. However, you may find that your blood glucose is higher than usual in the hospital whether you have diabetes or not. This is often referred to as "stress hyperglycemia." Stress can elevate your blood glucose. This happens because of hormones put out by the body during times of stress. If stress has been the cause of your high blood glucose, it can be followed regularly by your caregiver. That way he/she can make sure your hyperglycemia does not continue to get worse or progress to  diabetes. °Steroids °· Steroids are medications that act on the infection fighting system (immune system) to block inflammation or infection. One side effect can be a rise in blood glucose. Most people can produce enough extra insulin to allow for this rise, but for those who cannot, steroids make blood glucose levels go even higher. It is not unusual for steroid treatments to "uncover" diabetes that is developing. It is not always possible to determine if the hyperglycemia will go away after the steroids are stopped. A special blood test called an A1c is sometimes done to determine if your blood glucose was elevated before the steroids were started. °SYMPTOMS °· Thirsty. °· Frequent urination. °· Dry mouth. °· Blurred vision. °· Tired or fatigue. °· Weakness. °· Sleepy. °· Tingling in feet or leg. °DIAGNOSIS  °Diagnosis is made by monitoring blood glucose in one or all of the following ways: °· A1c test. This is a chemical found in your blood. °· Fingerstick blood glucose monitoring. °· Laboratory results. °TREATMENT  °First, knowing the cause of the hyperglycemia is important before the hyperglycemia can be treated. Treatment may include, but is not be limited to: °· Education. °· Change or adjustment in medications. °· Change or adjustment in meal plan. °· Treatment for an illness, infection, etc. °· More frequent blood glucose monitoring. °· Change in exercise plan. °· Decreasing or stopping steroids. °· Lifestyle changes. °HOME CARE INSTRUCTIONS  °· Test your blood glucose as directed. °· Exercise regularly. Your caregiver will give you instructions about exercise. Pre-diabetes or diabetes which comes on with stress is helped by exercising. °· Eat wholesome,   balanced meals. Eat often and at regular, fixed times. Your caregiver or nutritionist will give you a meal plan to guide your sugar intake.  Being at an ideal weight is important. If needed, losing as little as 10 to 15 pounds may help improve blood  glucose levels. SEEK MEDICAL CARE IF:   You have questions about medicine, activity, or diet.  You continue to have symptoms (problems such as increased thirst, urination, or weight gain). SEEK IMMEDIATE MEDICAL CARE IF:   You are vomiting or have diarrhea.  Your breath smells fruity.  You are breathing faster or slower.  You are very sleepy or incoherent.  You have numbness, tingling, or pain in your feet or hands.  You have chest pain.  Your symptoms get worse even though you have been following your caregiver's orders.  If you have any other questions or concerns.   This information is not intended to replace advice given to you by your health care provider. Make sure you discuss any questions you have with your health care provider.   Document Released: 03/04/2001 Document Revised: 12/01/2011 Document Reviewed: 05/15/2015 Elsevier Interactive Patient Education 2016 Elsevier [employer]  Urinary Tract Infection Urinary tract infections (UTIs) can develop anywhere along your urinary tract. Your urinary tract is your body's drainage system for removing wastes and extra water. Your urinary tract includes two kidneys, two ureters, a bladder, and a urethra. Your kidneys are a pair of bean-shaped organs. Each kidney is about the size of your fist. They are located below your ribs, one on each side of your spine. CAUSES Infections are caused by microbes, which are microscopic organisms, including fungi, viruses, and bacteria. These organisms are so small that they can only be seen through a microscope. Bacteria are the microbes that most commonly cause UTIs. SYMPTOMS  Symptoms of UTIs may vary by age and gender of the patient and by the location of the infection. Symptoms in young women typically include a frequent and intense urge to urinate and a painful, burning feeling in the bladder or urethra during urination. Older women and men are more likely to be tired, shaky, and weak and have  muscle aches and abdominal pain. A fever may mean the infection is in your kidneys. Other symptoms of a kidney infection include pain in your back or sides below the ribs, nausea, and vomiting. DIAGNOSIS To diagnose a UTI, your caregiver will ask you about your symptoms. Your caregiver will also ask you to provide a urine sample. The urine sample will be tested for bacteria and white blood cells. White blood cells are made by your body to help fight infection. TREATMENT  Typically, UTIs can be treated with medication. Because most UTIs are caused by a bacterial infection, they usually can be treated with the use of antibiotics. The choice of antibiotic and length of treatment depend on your symptoms and the type of bacteria causing your infection. HOME CARE INSTRUCTIONS  If you were prescribed antibiotics, take them exactly as your caregiver instructs you. Finish the medication even if you feel better after you have only taken some of the medication.  Drink enough water and fluids to keep your urine clear or pale yellow.  Avoid caffeine, tea, and carbonated beverages. They tend to irritate your bladder.  Empty your bladder often. Avoid holding urine for long periods of time.  Empty your bladder before and after sexual intercourse.  After a bowel movement, women should cleanse from front to back. Use each tissue only  once. SEEK MEDICAL CARE IF:   You have back pain.  You develop a fever.  Your symptoms do not begin to resolve within 3 days. SEEK IMMEDIATE MEDICAL CARE IF:   You have severe back pain or lower abdominal pain.  You develop chills.  You have nausea or vomiting.  You have continued burning or discomfort with urination. MAKE SURE YOU:   Understand these instructions.  Will watch your condition.  Will get help right away if you are not doing well or get worse.   This information is not intended to replace advice given to you by your health care provider. Make sure  you discuss any questions you have with your health care provider.   Document Released: 06/18/2005 Document Revised: 05/30/2015 Document Reviewed: 10/17/2011 Elsevier Interactive Patient Education [employer].

## 2015-08-02 NOTE — ED Notes (Signed)
Patient not in room yet 

## 2015-08-02 NOTE — ED Notes (Addendum)
Informed Dr. Betsey Holiday of increased CBG of 460. Discussed order for additional 1000 mL NS and insulin. Waiting on orders.

## 2015-08-03 LAB — URINE CULTURE

## 2016-03-22 ENCOUNTER — Encounter (HOSPITAL_COMMUNITY): Payer: Self-pay | Admitting: Emergency Medicine

## 2016-03-22 ENCOUNTER — Emergency Department (HOSPITAL_COMMUNITY)
Admission: EM | Admit: 2016-03-22 | Discharge: 2016-03-22 | Disposition: A | Discharge status: Home / Self Care | Payer: Medicaid Other | Attending: Emergency Medicine | Admitting: Emergency Medicine

## 2016-03-22 ENCOUNTER — Emergency Department (HOSPITAL_COMMUNITY): Payer: Medicaid Other

## 2016-03-22 DIAGNOSIS — Z79899 Other long term (current) drug therapy: Secondary | ICD-10-CM | POA: Insufficient documentation

## 2016-03-22 DIAGNOSIS — Z794 Long term (current) use of insulin: Secondary | ICD-10-CM | POA: Insufficient documentation

## 2016-03-22 DIAGNOSIS — I10 Essential (primary) hypertension: Secondary | ICD-10-CM | POA: Diagnosis not present

## 2016-03-22 DIAGNOSIS — M79602 Pain in left arm: Secondary | ICD-10-CM | POA: Diagnosis not present

## 2016-03-22 DIAGNOSIS — F17210 Nicotine dependence, cigarettes, uncomplicated: Secondary | ICD-10-CM | POA: Diagnosis not present

## 2016-03-22 DIAGNOSIS — Z7984 Long term (current) use of oral hypoglycemic drugs: Secondary | ICD-10-CM | POA: Diagnosis not present

## 2016-03-22 DIAGNOSIS — E1165 Type 2 diabetes mellitus with hyperglycemia: Secondary | ICD-10-CM | POA: Diagnosis not present

## 2016-03-22 DIAGNOSIS — M79601 Pain in right arm: Secondary | ICD-10-CM | POA: Insufficient documentation

## 2016-03-22 DIAGNOSIS — IMO0002 Reserved for concepts with insufficient information to code with codable children: Secondary | ICD-10-CM

## 2016-03-22 DIAGNOSIS — IMO0001 Reserved for inherently not codable concepts without codable children: Secondary | ICD-10-CM

## 2016-03-22 LAB — TROPONIN I: Troponin I: <0.03 ng/mL (ref <0.03)

## 2016-03-22 LAB — COMPREHENSIVE METABOLIC PANEL
ALBUMIN: 3.8 g/dL (ref 3.5–5.0)
ALK PHOS: 72 U/L (ref 38–126)
ALT: 17 U/L (ref 17–63)
ANION GAP: 9 (ref 5–15)
AST: 19 U/L (ref 15–41)
BILIRUBIN TOTAL: 0.4 mg/dL (ref 0.3–1.2)
BUN: 20 mg/dL (ref 6–20)
CALCIUM: 9.4 mg/dL (ref 8.9–10.3)
CO2: 27 mmol/L (ref 22–32)
Chloride: 101 mmol/L (ref 101–111)
Creatinine, Ser: 1.19 mg/dL (ref 0.61–1.24)
GFR calc Af Amer: >60 mL/min (ref >60)
GLUCOSE: 350 mg/dL — AB (ref 65–99)
Potassium: 4.2 mmol/L (ref 3.5–5.1)
Sodium: 137 mmol/L (ref 135–145)
TOTAL PROTEIN: 6.8 g/dL (ref 6.5–8.1)

## 2016-03-22 LAB — CBC
HEMATOCRIT: 41 % (ref 39.0–52.0)
HEMOGLOBIN: 13.7 g/dL (ref 13.0–17.0)
MCH: 28.8 pg (ref 26.0–34.0)
MCHC: 33.4 g/dL (ref 30.0–36.0)
MCV: 86.1 fL (ref 78.0–100.0)
Platelets: 256 10*3/uL (ref 150–400)
RBC: 4.76 MIL/uL (ref 4.22–5.81)
RDW: 12.6 % (ref 11.5–15.5)
WBC: 7.4 10*3/uL (ref 4.0–10.5)

## 2016-03-22 LAB — URINALYSIS, ROUTINE W REFLEX MICROSCOPIC
Bilirubin Urine: NEGATIVE
HGB URINE DIPSTICK: NEGATIVE
KETONES UR: NEGATIVE mg/dL
LEUKOCYTES UA: NEGATIVE
Nitrite: NEGATIVE
PH: 5 (ref 5.0–8.0)
Protein, ur: NEGATIVE mg/dL
Specific Gravity, Urine: 1.04 — ABNORMAL HIGH (ref 1.005–1.030)

## 2016-03-22 LAB — MAGNESIUM: MAGNESIUM: 1.9 mg/dL (ref 1.7–2.4)

## 2016-03-22 LAB — PROTIME-INR
INR: 1.04 (ref 0.00–1.49)
Prothrombin Time: 13.8 seconds (ref 11.6–15.2)

## 2016-03-22 LAB — URINE MICROSCOPIC-ADD ON
BACTERIA UA: NONE SEEN
RBC / HPF: NONE SEEN RBC/hpf (ref 0–5)

## 2016-03-22 LAB — CBG MONITORING, ED: GLUCOSE-CAPILLARY: 180 mg/dL — AB (ref 65–99)

## 2016-03-22 MED ORDER — METOPROLOL TARTRATE 5 MG/5ML IV SOLN
5.0000 mg | Freq: Once | INTRAVENOUS | Status: AC
  Administered 2016-03-22: 5 mg via INTRAVENOUS
  Filled 2016-03-22: qty 5

## 2016-03-22 MED ORDER — ASPIRIN 81 MG PO CHEW
324.0000 mg | CHEWABLE_TABLET | Freq: Once | ORAL | Status: AC
  Administered 2016-03-22: 324 mg via ORAL
  Filled 2016-03-22: qty 4

## 2016-03-22 MED ORDER — METFORMIN HCL 1000 MG PO TABS: 1000.0000 mg | ORAL_TABLET | Freq: Two times a day (BID) | ORAL | Status: DC

## 2016-03-22 MED ORDER — AMLODIPINE BESYLATE 5 MG PO TABS: 5.0000 mg | ORAL_TABLET | Freq: Every day | ORAL | Status: DC

## 2016-03-22 MED ORDER — HYDROCHLOROTHIAZIDE 25 MG PO TABS
25.0000 mg | ORAL_TABLET | Freq: Every day | ORAL | Status: DC
Start: 2016-03-22 — End: 2016-09-23

## 2016-03-22 MED ORDER — GABAPENTIN 300 MG PO CAPS: 300.0000 mg | ORAL_CAPSULE | Freq: Every day | ORAL | Status: DC

## 2016-03-22 MED ORDER — INSULIN ASPART 100 UNIT/ML ~~LOC~~ SOLN
10.0000 [IU] | Freq: Once | SUBCUTANEOUS | Status: AC
  Administered 2016-03-22: 10 [IU] via INTRAVENOUS
  Filled 2016-03-22: qty 1

## 2016-03-22 NOTE — ED Notes (Signed)
Per [facility], pt c/o bilateral arm cramping x1 year, cramping got worse today. Pt given Neurontin last time this happen, unable to take this med at home. CBG 452. Pt aaox4, ambulatory without issue.

## 2016-03-22 NOTE — ED Provider Notes (Signed)
CSN: [id number]     Arrival date & time 03/22/16  2015 History   First MD Initiated Contact with Patient 03/22/16 2017     Chief Complaint  Patient presents with  . Arm Pain  . Hyperglycemia   PT IS A 55 YO BM WITH A HX OF DM AND HTN.  HE SAID HE RAN OUT OF HIS BP MEDS AND HIS INSULIN.  THE PT ALSO C/O BILATERAL ARM PAIN AND CRAMPING.  PT SAID THE PAIN STARTED AROUND 11:30 AM.  HE SAID HE HAD THIS PAIN ABOUT 1 YEAR AGO.  HE DENIES ANY SOB, F/C, N/V.  (Consider location/radiation/quality/duration/timing/severity/associated sxs/prior Treatment) Patient is a 55 y.o. male presenting with arm pain and hyperglycemia. The history is provided by the patient.  Arm Pain This is a recurrent problem. The current episode started 6 to 12 hours ago. The problem occurs constantly. The problem has not changed since onset.Nothing aggravates the symptoms. Nothing relieves the symptoms.  Hyperglycemia   Past Medical History  Diagnosis Date  . Hypertension   . Ruptured lumbar disc   . Back pain, chronic   . Diabetes mellitus without complication [facility])    Past Surgical History  Procedure Laterality Date  . Back surgery    . Abdominal surgery     Family History  Problem Relation Age of Onset  . Diabetes Mother   . Diabetes Sister   . Diabetes Brother    Social History  Substance Use Topics  . Smoking status: Current Some Day Smoker    Types: Cigarettes  . Smokeless tobacco: None  . Alcohol Use: No    Review of Systems  Musculoskeletal:       BILATERAL ARM PAIN  All other systems reviewed and are negative.     Allergies  Lisinopril  Home Medications   Prior to Admission medications   Medication Sig Start Date End Date Taking? Authorizing Provider  omeprazole (PRILOSEC OTC) 20 MG tablet Take 20 mg by mouth daily.   Yes Historical Provider, MD  PREDNISONE, PAK, PO Take by mouth.   Yes Historical Provider, MD  amLODipine (NORVASC) 5 MG tablet Take 1 tablet (5 mg total) by mouth daily.  03/22/16   Isla Pence, MD  cephALEXin (KEFLEX) 500 MG capsule Take 1 capsule (500 mg total) by mouth 4 (four) times daily. 08/02/15   Orpah Greek, MD  cyclobenzaprine (FLEXERIL) 10 MG tablet Take 1 tablet (10 mg total) by mouth 3 (three) times daily as needed for muscle spasms. 08/25/14   Lance Bosch, NP  esomeprazole (NEXIUM) 40 MG capsule Take 1 capsule (40 mg total) by mouth daily at 12 noon. Patient not taking: Reported on 08/02/2015 07/18/15   Julianne Rice, MD  gabapentin (NEURONTIN) 300 MG capsule Take 1 capsule (300 mg total) by mouth at bedtime. 03/22/16   Isla Pence, MD  hydrochlorothiazide (HYDRODIURIL) 25 MG tablet Take 1 tablet (25 mg total) by mouth daily. 03/22/16   Isla Pence, MD  metFORMIN (GLUCOPHAGE) 1000 MG tablet Take 1 tablet (1,000 mg total) by mouth 2 (two) times daily with a meal. 03/22/16   Isla Pence, MD  methocarbamol (ROBAXIN) 500 MG tablet Take 2 tablets (1,000 mg total) by mouth 4 (four) times daily. 02/24/15   Carlisle Cater, PA-C  sucralfate (CARAFATE) 1 G tablet Take 1 tablet (1 g total) by mouth 2 (two) times daily as needed (abdominal pain). Patient not taking: Reported on 08/02/2015 07/18/15   Julianne Rice, MD  terbinafine (LAMISIL AT) 1 %  cream Apply 1 application topically 2 (two) times daily. For foot fungus Patient not taking: Reported on 08/02/2015 03/14/15   Lance Bosch, NP  traMADol (ULTRAM) 50 MG tablet Take 1 tablet (50 mg total) by mouth every 6 (six) hours as needed. 07/18/15   Julianne Rice, MD   BP 132/97 mmHg  Pulse 72  Temp(Src) 98.7 F (37.1 C) (Oral)  Resp 17  Ht 5\' 11"  (1.803 m)  Wt 205 lb (92.987 kg)  BMI 28.60 kg/m2  SpO2 97% Physical Exam  Constitutional: He is oriented to person, place, and time. He appears well-developed and well-nourished.  HENT:  Head: Normocephalic and atraumatic.  Right Ear: External ear normal.  Left Ear: External ear normal.  Nose: Nose normal.  Mouth/Throat: Oropharynx is clear  and moist.  Eyes: Conjunctivae and EOM are normal. Pupils are equal, round, and reactive to light.  Neck: Normal range of motion. Neck supple.  Cardiovascular: Normal rate, regular rhythm, normal heart sounds and intact distal pulses.   Pulmonary/Chest: Effort normal and breath sounds normal.  Abdominal: Soft. Bowel sounds are normal.  Musculoskeletal: Normal range of motion.  Neurological: He is alert and oriented to person, place, and time.  Skin: Skin is warm and dry.  Psychiatric: He has a normal mood and affect. His behavior is normal. Judgment and thought content normal.  Nursing note and vitals reviewed.   ED Course  Procedures (including critical care time) Labs Review Labs Reviewed  COMPREHENSIVE METABOLIC PANEL - Abnormal; Notable for the following:    Glucose, Bld 350 (*)    All other components within normal limits  URINALYSIS, ROUTINE W REFLEX MICROSCOPIC (NOT AT [facility]) - Abnormal; Notable for the following:    Specific Gravity, Urine 1.040 (*)    Glucose, UA >1000 (*)    All other components within normal limits  URINE MICROSCOPIC-ADD ON - Abnormal; Notable for the following:    Squamous Epithelial / LPF 0-5 (*)    All other components within normal limits  CBG MONITORING, ED - Abnormal; Notable for the following:    Glucose-Capillary 180 (*)    All other components within normal limits  CBC  MAGNESIUM  PROTIME-INR  TROPONIN I    Imaging Review Dg Chest 2 View  03/22/2016  CLINICAL DATA:  Bilateral arm cramping for 1 year.  Worsening today. EXAM: CHEST  2 VIEW COMPARISON:  12/30/2003. FINDINGS: The lungs are clear wiithout focal pneumonia, edema, pneumothorax or pleural effusion. Mild hyperexpansion noted. Symmetric nodular densities over the lower lungs are probably nipple shadows. A third asymmetric left-sided lung nodule is seen superimposed on the anterior left fifth rib. The cardiopericardial silhouette is within normal limits for size. The visualized bony  structures of the thorax are intact. IMPRESSION: Probable symmetric nipple shadows with a third asymmetric left-sided pulmonary nodule. CT chest without contrast recommended to further evaluate. Electronically Signed   By: Misty Stanley M.D.   On: 03/22/2016 20:52   I have personally reviewed and evaluated these images and lab results as part of my medical decision-making.   EKG Interpretation   Date/Time:  Saturday March 22 2016 20:21:20 EDT Ventricular Rate:  91 PR Interval:    QRS Duration: 91 QT Interval:  333 QTC Calculation: 410 R Axis:   51 Text Interpretation:  Sinus rhythm Right atrial enlargement Borderline T  abnormalities, lateral leads Confirmed by Aleks Nawrot MD, Tanganika Barradas ([id number]) on  03/22/2016 8:27:05 PM      MDM  BP AND BS MUCH BETTER.  PT'S PAIN HAS RESOLVED.  HE IS INSTR TO RETURN IF WORSE.  HE SAID THAT HE COULD GET HIS MEDS FILLED IF I GAVE HIM A RX, SO I REFILLED HIS HTN AND DM MEDS. Final diagnoses:  Uncontrolled type 2 diabetes mellitus without complication, with long-term current use of insulin [facility])  Essential hypertension      Isla Pence, MD 03/22/16 2320

## 2016-04-03 DIAGNOSIS — R739 Hyperglycemia, unspecified: Secondary | ICD-10-CM

## 2016-04-15 DIAGNOSIS — R739 Hyperglycemia, unspecified: Secondary | ICD-10-CM

## 2016-04-16 LAB — GLUCOSE, POCT (MANUAL RESULT ENTRY)
POC GLUCOSE: 506 mg/dL — AB (ref 70–99)
POC Glucose: 441 mg/dl — AB (ref 70–99)

## 2016-04-16 MED FILL — *NOVOLOG MIX 70/30 10ML VL: (70-30) 100 | 41 days supply | Qty: 10 | Fill #0

## 2016-04-16 MED FILL — ULTICARE INS 0.3 ML 30GX1/2: 30G X 1/2" | 25 days supply | Qty: 100 | Fill #0

## 2016-04-16 MED FILL — *LEVEMIR 100U/ML VIAL 10ML: 100 | 25 days supply | Qty: 10 | Fill #0

## 2016-04-16 NOTE — Congregational Nurse Program (Signed)
Congregational Nurse Program Note  Date of Encounter: 04/15/2016  Past Medical History: Past Medical History:  Diagnosis Date  . Back pain, chronic   . Diabetes mellitus without complication ([place])   . Hypertension   . Ruptured lumbar disc     Encounter Details:     CNP Questionnaire - 04/15/16 1039      Patient Demographics   Is this a new or existing patient? Existing   Patient is considered a/an Not Applicable   Race African-American/Black     Patient Assistance   Location of Patient Assistance Not Applicable   Patient's financial/insurance status Low Income;Self-Pay   Uninsured Patient Yes   Interventions Counseled to make appt. with provider   Patient referred to apply for the following financial assistance [employer] insecurities addressed Provided food supplies   Transportation assistance No   Assistance securing medications No   Educational health offerings Chronic disease;Diabetes;Navigating the healthcare system     Encounter Details   Primary purpose of visit Chronic Illness/Condition Visit;Navigating the Healthcare System   Was an Emergency Department visit averted? Not Applicable   Does patient have a medical provider? Yes   Patient referred to Clinic   Was a mental health screening completed? (GAINS tool) No   Does patient have dental issues? No   Does patient have vision issues? No   Does your patient have an abnormal blood pressure today? No   Since previous encounter, have you referred patient for abnormal blood pressure that resulted in a new diagnosis or medication change? No   Does your patient have an abnormal blood glucose today? Yes   Since previous encounter, have you referred patient for abnormal blood glucose that resulted in a new diagnosis or medication change? No   Was there a life-saving intervention made? No      Client reports he still has not gotten his insulin.  States the medication assistance program denied  him.  States he does not know why.  TC to his provider Marliss Coots, NP.  She reported client has been informed that he needs to supply a denial letter from [facility] for MAP to continue giving his meds.  Client informed of the above.  Client states he was not aware of such.  That he has applied to Medicaid but has not heard back.  It will be up to two more months before he will find out.  Notified Audrea Muscat Placey of this.  Awaiting directives

## 2016-04-16 NOTE — Congregational Nurse Program (Signed)
Congregational Nurse Program Note  Date of Encounter: 04/03/2016  Past Medical History: Past Medical History:  Diagnosis Date  . Back pain, chronic   . Diabetes mellitus without complication ([place])   . Hypertension   . Ruptured lumbar disc     Encounter Details:     CNP Questionnaire - 04/03/16 1033      Patient Demographics   Is this a new or existing patient? New   Patient is considered a/an Not Applicable   Race African-American/Black     Patient Assistance   Location of Patient Assistance Not Applicable   Patient's financial/insurance status Low Income;Self-Pay   Uninsured Patient Yes   Interventions Counseled to make appt. with provider   Patient referred to apply for the following financial assistance [employer] insecurities addressed Provided food supplies   Transportation assistance No   Assistance securing medications No   Educational health offerings Chronic disease;Diabetes;Navigating the healthcare system     Encounter Details   Primary purpose of visit Chronic Illness/Condition Visit;Navigating the Healthcare System   Was an Emergency Department visit averted? Not Applicable   Does patient have a medical provider? Yes   Patient referred to Clinic   Was a mental health screening completed? (GAINS tool) No   Does patient have dental issues? No   Does patient have vision issues? No   Does your patient have an abnormal blood pressure today? No   Since previous encounter, have you referred patient for abnormal blood pressure that resulted in a new diagnosis or medication change? No   Does your patient have an abnormal blood glucose today? Yes   Since previous encounter, have you referred patient for abnormal blood glucose that resulted in a new diagnosis or medication change? No   Was there a life-saving intervention made? No      Client is diabetic and is out of his insulin.  See Marliss Coots NP at [facility].  Is awaiting his insulin  from the Medication Assistance program.  Discussed with him the importance of keeping in touch with his provide and MAP as to the status of his insulin due to his elevated blood sugars

## 2016-04-17 DIAGNOSIS — R739 Hyperglycemia, unspecified: Secondary | ICD-10-CM

## 2016-04-25 ENCOUNTER — Emergency Department (HOSPITAL_COMMUNITY)
Admission: EM | Admit: 2016-04-25 | Discharge: 2016-04-25 | Disposition: A | Discharge status: Home / Self Care | Payer: Medicaid Other | Attending: Emergency Medicine | Admitting: Emergency Medicine

## 2016-04-25 ENCOUNTER — Other Ambulatory Visit: Payer: Self-pay | Admitting: Student

## 2016-04-25 ENCOUNTER — Encounter (HOSPITAL_COMMUNITY): Payer: Self-pay | Admitting: *Deleted

## 2016-04-25 ENCOUNTER — Emergency Department (HOSPITAL_COMMUNITY): Payer: Medicaid Other

## 2016-04-25 DIAGNOSIS — I208 Other forms of angina pectoris: Secondary | ICD-10-CM

## 2016-04-25 DIAGNOSIS — F17210 Nicotine dependence, cigarettes, uncomplicated: Secondary | ICD-10-CM | POA: Insufficient documentation

## 2016-04-25 DIAGNOSIS — Z7984 Long term (current) use of oral hypoglycemic drugs: Secondary | ICD-10-CM | POA: Insufficient documentation

## 2016-04-25 DIAGNOSIS — I10 Essential (primary) hypertension: Secondary | ICD-10-CM | POA: Diagnosis not present

## 2016-04-25 DIAGNOSIS — E785 Hyperlipidemia, unspecified: Secondary | ICD-10-CM

## 2016-04-25 DIAGNOSIS — Z7982 Long term (current) use of aspirin: Secondary | ICD-10-CM | POA: Diagnosis not present

## 2016-04-25 DIAGNOSIS — R079 Chest pain, unspecified: Secondary | ICD-10-CM

## 2016-04-25 DIAGNOSIS — E1165 Type 2 diabetes mellitus with hyperglycemia: Secondary | ICD-10-CM | POA: Insufficient documentation

## 2016-04-25 DIAGNOSIS — I2089 Other forms of angina pectoris: Secondary | ICD-10-CM

## 2016-04-25 DIAGNOSIS — R0789 Other chest pain: Secondary | ICD-10-CM | POA: Insufficient documentation

## 2016-04-25 HISTORY — DX: Long term (current) use of insulin: Z79.4

## 2016-04-25 HISTORY — DX: Type 2 diabetes mellitus without complications: E11.9

## 2016-04-25 HISTORY — DX: Hyperlipidemia, unspecified: E78.5

## 2016-04-25 HISTORY — DX: Reserved for inherently not codable concepts without codable children: IMO0001

## 2016-04-25 LAB — BASIC METABOLIC PANEL
ANION GAP: 6 (ref 5–15)
BUN: 17 mg/dL (ref 6–20)
CALCIUM: 9.1 mg/dL (ref 8.9–10.3)
CO2: 28 mmol/L (ref 22–32)
Chloride: 102 mmol/L (ref 101–111)
Creatinine, Ser: 1.08 mg/dL (ref 0.61–1.24)
Glucose, Bld: 318 mg/dL — ABNORMAL HIGH (ref 65–99)
Potassium: 4.5 mmol/L (ref 3.5–5.1)
Sodium: 136 mmol/L (ref 135–145)

## 2016-04-25 LAB — CBC
HCT: 39.8 % (ref 39.0–52.0)
HEMOGLOBIN: 13 g/dL (ref 13.0–17.0)
MCH: 29 pg (ref 26.0–34.0)
MCHC: 32.7 g/dL (ref 30.0–36.0)
MCV: 88.8 fL (ref 78.0–100.0)
Platelets: 234 10*3/uL (ref 150–400)
RBC: 4.48 MIL/uL (ref 4.22–5.81)
RDW: 12.8 % (ref 11.5–15.5)
WBC: 5.7 10*3/uL (ref 4.0–10.5)

## 2016-04-25 LAB — HEPATIC FUNCTION PANEL
ALBUMIN: 3.6 g/dL (ref 3.5–5.0)
ALT: 20 U/L (ref 17–63)
AST: 20 U/L (ref 15–41)
Alkaline Phosphatase: 57 U/L (ref 38–126)
Bilirubin, Direct: <0.1 mg/dL — ABNORMAL LOW (ref 0.1–0.5)
TOTAL PROTEIN: 6.5 g/dL (ref 6.5–8.1)
Total Bilirubin: 0.7 mg/dL (ref 0.3–1.2)

## 2016-04-25 LAB — LIPASE, BLOOD: Lipase: 42 U/L (ref 11–51)

## 2016-04-25 LAB — I-STAT TROPONIN, ED: TROPONIN I, POC: 0 ng/mL (ref 0.00–0.08)

## 2016-04-25 LAB — TROPONIN I

## 2016-04-25 MED ORDER — IOPAMIDOL (ISOVUE-370) INJECTION 76%
INTRAVENOUS | Status: AC
  Administered 2016-04-25: 100 mL
  Filled 2016-04-25: qty 100

## 2016-04-25 MED ORDER — NITROGLYCERIN 0.4 MG SL SUBL: 0.4000 mg | SUBLINGUAL_TABLET | SUBLINGUAL | Status: DC | PRN

## 2016-04-25 MED ORDER — ASPIRIN 81 MG PO CHEW
324.0000 mg | CHEWABLE_TABLET | Freq: Once | ORAL | Status: AC
  Administered 2016-04-25: 324 mg via ORAL
  Filled 2016-04-25: qty 4

## 2016-04-25 MED ORDER — ASPIRIN 81 MG PO CHEW: 81.0000 mg | CHEWABLE_TABLET | Freq: Every day | ORAL | 0 refills | Status: DC

## 2016-04-25 MED ORDER — AMLODIPINE BESYLATE 10 MG PO TABS: 10.0000 mg | ORAL_TABLET | Freq: Every day | ORAL | 0 refills | Status: DC

## 2016-04-25 NOTE — ED Notes (Signed)
Cardiology at bedside.

## 2016-04-25 NOTE — Consult Note (Signed)
Cardiology Consult    Patient ID: Ryan Medina MRN: [id number], DOB/AGE: 55-Nov-1962   Admit date: 04/25/2016 Date of Consult: 04/25/2016  Primary Physician: Carmie Kanner, NP Reason for Consult: Chest Pain Primary Cardiologist: New - Dr. Radford Pax Requesting Provider: Dr. Ellender Hose   History of Present Illness    Ryan Medina is a 55 y.o. male with past medical history of IDDM, HTN, HLD, and tobacco abuse who presents to [facility] ED on 04/25/2016 for evaluation of chest pain.   Reports he developed chest discomfort last night while watching television which was a shooting pain along his sternum, lasting only 5 seconds. Says he had mild dyspnea after the pain, due to being anxious about his initial symptoms. He went to sleep and did not have any recurrence in pain until this morning. Starting at 0800, he developed the shooting pain again and experienced about 5 episodes of this, all lasting less than 10 seconds each. He reports walking "most of the day" everyday and denies any chest discomfort or dyspnea with exertion during activity.   He denies any prior cardiac history. Does have HTN, HLD, and IDDM which he says is followed by his PCP. Says his mom and maternal grandmother both had CHF. Smokes 3-4 cigarettes per day and has done so for 20+ years.   Labs show a Na+ 136, K+ 4.5, creatinine 1.08, glucose 318. CBC with WBC of 5.7, Hgb 13.0, and platelets 234. Initial troponin negative. EKG shows NSR, HR 88, with nonspecific lateral T-wave changes likely secondary to LVH. CXR with persistent 8 mm nodular opacity in the left mid lung. Advise noncontrast enhanced chest CT to further assess. No edema or Consolidation. CTA with no evidence of aortic dissection or PE.   Past Medical History   Past Medical History:  Diagnosis Date  . Back pain, chronic   . HLD (hyperlipidemia)   . Hypertension   . IDDM (insulin dependent diabetes mellitus) ([place])   . Ruptured lumbar disc     Past Surgical  History:  Procedure Laterality Date  . ABDOMINAL SURGERY    . APPENDECTOMY    . BACK SURGERY       Allergies  Allergies  Allergen Reactions  . Lisinopril Swelling    Inpatient Medications       Family History    Family History  Problem Relation Age of Onset  . Diabetes Mother   . Heart failure Mother   . Diabetes Sister   . Diabetes Brother     Social History    Social History   Social History  . Marital status: Single    Spouse name: N/A  . Number of children: N/A  . Years of education: N/A   Occupational History  . Not on file.   Social History Main Topics  . Smoking status: Current Some Day Smoker    Packs/day: 0.30    Years: 25.00    Types: Cigarettes  . Smokeless tobacco: Never Used  . Alcohol use No  . Drug use: No  . Sexual activity: Not on file   Other Topics Concern  . Not on file   Social History Narrative  . No narrative on file     Review of Systems    General:  No chills, fever, night sweats or weight changes.  Cardiovascular:  No dyspnea on exertion, edema, orthopnea, palpitations, paroxysmal nocturnal dyspnea. Positive for chest pain.  Dermatological: No rash, lesions/masses Respiratory: No cough, dyspnea Urologic: No hematuria, dysuria Abdominal:  No nausea, vomiting, diarrhea, bright red blood per rectum, melena, or hematemesis Neurologic:  No visual changes, wkns, changes in mental status. All other systems reviewed and are otherwise negative except as noted above.  Physical Exam    Blood pressure (!) 145/103, pulse 70, temperature 98.3 F (36.8 C), temperature source Oral, resp. rate 18, weight 208 lb 5 oz (94.5 kg), SpO2 96 %.  General: Pleasant, African American male appearing in NAD. Psych: Normal affect. Neuro: Alert and oriented X 3. Moves all extremities spontaneously. HEENT: Normal  Neck: Supple without bruits or JVD. Lungs:  Resp regular and unlabored, CTA without wheezing or rales. Heart: RRR no s3, s4, or  murmurs. Abdomen: Soft, non-tender, non-distended, BS + x 4.  Extremities: No clubbing, cyanosis or edema. DP/PT/Radials 2+ and equal bilaterally.  Labs    Troponin [facility] of Care Test)  Recent Labs  04/25/16 0858  TROPIPOC 0.00   No results for input(s): CKTOTAL, CKMB, TROPONINI in the last 72 hours. Lab Results  Component Value Date   WBC 5.7 04/25/2016   HGB 13.0 04/25/2016   HCT 39.8 04/25/2016   MCV 88.8 04/25/2016   PLT 234 04/25/2016     Recent Labs Lab 04/25/16 0853 04/25/16 1016  NA 136  --   K 4.5  --   CL 102  --   CO2 28  --   BUN 17  --   CREATININE 1.08  --   CALCIUM 9.1  --   PROT  --  6.5  BILITOT  --  0.7  ALKPHOS  --  57  ALT  --  20  AST  --  20  GLUCOSE 318*  --    No results found for: CHOL, HDL, LDLCALC, TRIG No results found for: [facility]   Radiology Studies    Dg Chest 2 View: Result Date: 04/25/2016 CLINICAL DATA:  Left-sided chest pain and shortness of breath EXAM: CHEST  2 VIEW COMPARISON:  March 22, 2016 FINDINGS: There is a persistent nodular opacity in the left mid lung measuring 8 mm. This nodular opacity is separate from an apparent nipple shadow. Lungs elsewhere clear. Heart size and pulmonary vascularity are normal. No adenopathy. No bone lesions. IMPRESSION: Persistent 8 mm nodular opacity in the left mid lung. Advise noncontrast enhanced chest CT to further assess. No edema or consolidation. Stable cardiac silhouette. Electronically Signed   By: Lowella Grip III M.D.   On: 04/25/2016 09:31   Ct Angio Chest Aorta W And/or Wo Contrast: Result Date: 04/25/2016 CLINICAL DATA:  Left-sided chest pain EXAM: CT ANGIOGRAPHY CHEST WITH CONTRAST TECHNIQUE: Multidetector CT imaging of the chest was performed using the standard protocol during bolus administration of intravenous contrast. Multiplanar CT image reconstructions and MIPs were obtained to evaluate the vascular anatomy. CONTRAST:  100 mL Isovue 370. COMPARISON:  None. FINDINGS:  Mediastinum/Lymph Nodes: Thoracic inlet is within normal limits. There are no findings to suggest hilar or mediastinal adenopathy. Cardiovascular: The thoracic aorta is within normal limits. No aneurysmal dilatation or dissection is seen. The pulmonary artery as visualized shows a normal branching pattern. No filling defects to suggest pulmonary emboli are noted. Lungs/Pleura: No pulmonary mass, infiltrate, or effusion. Upper abdomen: No acute findings. Musculoskeletal: No chest wall mass or suspicious bone lesions identified. Review of the MIP images confirms the above findings. IMPRESSION: No evidence of aortic dissection or pulmonary emboli. No acute abnormality is seen. Electronically Signed   By: Inez Catalina M.D.   On: 04/25/2016 11:24  EKG & Cardiac Imaging    EKG: NSR, HR 88, with nonspecific lateral T-wave changes likely secondary to LVH.  Echocardiogram: None on File  Assessment & Plan    1. Atypical Chest Pain - reports having intermittent episodes of shooting chest pain starting last night and lasting for 5-10 seconds each. No association with exertion and no associated symptoms. No prior cardiac history. - initial troponin negative and EKG without acute ischemic changes. CTA with no evidence of aortic dissection or PE. Will check delta troponin, if negative he can likely be discharged from the ED, for overall his symptoms sound atypical for a cardiac etiology. - does have multiple cardiac risk factors including  IDDM, HTN, HLD, and tobacco abuse. Discussed with Dr. Radford Pax. Will order an outpatient Treadmill Myoview (will list appointment time and date on AVS sheet).   2. IDDM - glucose elevated to 318 while in the ED. - needs to follow-up with his PCP in regards to this.  3. HTN - BP has been 127/94 - 143/103 while in the ED. - continue HCTZ 25mg  daily. Increase Amlodipine from 5mg  to 10mg  daily.  4. Tobacco Abuse - smoking cessation advised.  5. Persistent 8 mm nodular  opacity in the left mid lung - needs to be followed by PCP.   Signed, Erma Heritage, PA-C 04/25/2016, 1:31 PM Pager: [phone]

## 2016-04-25 NOTE — Progress Notes (Signed)
Per Dr. Dene Gentry only need CTA chest looking for dissection.

## 2016-04-25 NOTE — Discharge Instructions (Addendum)
You have a Stress Test scheduled at [facility]. Your doctor has ordered this test to get a better idea of how your heart works. ° °Please arrive 15 minutes early for paperwork.  ° °Location: °[address], [address] °[place], [place] [address] °[phone] ° °Instructions: °· No food/drink after midnight the night before.  °· No caffeine/decaf products 24 hours before, including medicines such as Excedrin or Goody Powders. Call if there are any questions.  °· Wear comfortable clothes and shoes.  °· It is OK to take your morning meds with a sip of water EXCEPT for those types of medicines listed below or otherwise instructed. ° °Special Medication Instructions: °· Beta blockers such as metoprolol (Lopressor/Toprol XL), atenolol (Tenormin), carvedilol (Coreg), nebivolol (Bystolic), propranolol (Inderal) should not be taken for 24 hours before the test. °· Calcium channel blockers such as diltiazem (Cardizem) or verapmil (Calan) should not be taken for 24 hours before the test. °· Remove nitroglycerin patches and do not take nitrate preparations such as Imdur/isosorbide the day of your test. °· No Persantine/Theophylline or Aggrenox medicines should be used within 24 hours of the test.  ° °What To Expect: °The whole test will take several hours. When you arrive in the lab, the technician will inject a small amount of radioactive tracer into your arm through an IV while you are resting quietly. This helps us to form pictures of your heart. You will likely only feel a sting from the IV. After a waiting period, resting pictures will be obtained under a big camera. These are the "before" pictures. ° °Next, you will be prepped for the stress portion of the test. This may include either walking on a treadmill or receiving a medicine that helps to dilate blood vessels in your heart to simulate the effect of exercise on your heart. If you are walking on a treadmill, you will walk at different paces to  try to get your heart rate to a goal number that is based on your age. If your doctor has chosen the pharmacologic test, then you will receive a medicine through your IV that may cause temporary nausea, flushing, shortness of breath and sometimes chest discomfort or vomiting. This is typically short-lived and usually resolves quickly. Your blood pressure and heart rate will be monitored, and we will be watching your EKG on a computer screen for any changes. During this portion of the test, the radiologist will inject another small amount of radioactive tracer into your IV. After a waiting period, you will undergo a second set of pictures. These are the "after" pictures. ° °The doctor reading the test will compare the before-and-after images to look for evidence of heart blockages or heart weakness. In certain instances, this test is done over 2 days but usually only takes 1 day to complete. ° °

## 2016-04-25 NOTE — ED Provider Notes (Addendum)
[facility] DEPT Provider Note   CSN: [id number] Arrival date & time: 04/25/16  [id number]  First Provider Contact:  First MD Initiated Contact with Patient 04/25/16 [phone]      History   Chief Complaint Chief Complaint  Patient presents with  . Chest Pain    HPI Ryan Medina is a 55 y.o. male.  HPI 55 year old male with past medical history of hypertension, hyperlipidemia and diabetes who presents with atypical chest pain. The patient states he woke up this morning, then had acute onset of sharp, transient, left-sided chest pain. The pain radiated around his left chest wall and he had some mild associated left arm tingling. The pain was not associated with exertion. He was resting at the time. It has since come and gone multiple times. The pain is made worse with palpation as well as movement of his arm. Denies any recent trauma. Denies any shortness of breath currently, but he did have some SOB with the onset of pain. Denies any diaphoresis. Denies any substernal chest pressure. No nausea or vomiting.  Past Medical History:  Diagnosis Date  . Back pain, chronic   . HLD (hyperlipidemia)   . Hypertension   . IDDM (insulin dependent diabetes mellitus) ([place])   . Ruptured lumbar disc     Patient Active Problem List   Diagnosis Date Noted  . Atypical chest pain 04/25/2016  . DM (diabetes mellitus), type 2, uncontrolled ([place]) 03/14/2015  . Essential hypertension 03/14/2015  . Hyperglycemia 01/14/2015    Past Surgical History:  Procedure Laterality Date  . ABDOMINAL SURGERY    . APPENDECTOMY    . BACK SURGERY         Home Medications    Prior to Admission medications   Medication Sig Start Date End Date Taking? Authorizing Provider  gabapentin (NEURONTIN) 300 MG capsule Take 1 capsule (300 mg total) by mouth at bedtime. 03/22/16  Yes Isla Pence, MD  hydrochlorothiazide (HYDRODIURIL) 25 MG tablet Take 1 tablet (25 mg total) by mouth daily. 03/22/16  Yes Isla Pence, MD    metFORMIN (GLUCOPHAGE) 1000 MG tablet Take 1 tablet (1,000 mg total) by mouth 2 (two) times daily with a meal. 03/22/16  Yes Isla Pence, MD  amLODipine (NORVASC) 10 MG tablet Take 1 tablet (10 mg total) by mouth daily. 04/25/16   Duffy Bruce, MD  aspirin 81 MG chewable tablet Chew 1 tablet (81 mg total) by mouth daily. 04/25/16   Duffy Bruce, MD  cephALEXin (KEFLEX) 500 MG capsule Take 1 capsule (500 mg total) by mouth 4 (four) times daily. 08/02/15   Orpah Greek, MD  cyclobenzaprine (FLEXERIL) 10 MG tablet Take 1 tablet (10 mg total) by mouth 3 (three) times daily as needed for muscle spasms. 08/25/14   Lance Bosch, NP  esomeprazole (NEXIUM) 40 MG capsule Take 1 capsule (40 mg total) by mouth daily at 12 noon. Patient not taking: Reported on 08/02/2015 07/18/15   Julianne Rice, MD  methocarbamol (ROBAXIN) 500 MG tablet Take 2 tablets (1,000 mg total) by mouth 4 (four) times daily. 02/24/15   Carlisle Cater, PA-C  sucralfate (CARAFATE) 1 G tablet Take 1 tablet (1 g total) by mouth 2 (two) times daily as needed (abdominal pain). Patient not taking: Reported on 08/02/2015 07/18/15   Julianne Rice, MD  terbinafine (LAMISIL AT) 1 % cream Apply 1 application topically 2 (two) times daily. For foot fungus Patient not taking: Reported on 08/02/2015 03/14/15   Lance Bosch, NP  traMADol Veatrice Bourbon)  50 MG tablet Take 1 tablet (50 mg total) by mouth every 6 (six) hours as needed. 07/18/15   Julianne Rice, MD    Family History Family History  Problem Relation Age of Onset  . Diabetes Mother   . Heart failure Mother   . Diabetes Sister   . Diabetes Brother     Social History Social History  Substance Use Topics  . Smoking status: Current Some Day Smoker    Packs/day: 0.30    Years: 25.00    Types: Cigarettes  . Smokeless tobacco: Never Used  . Alcohol use No     Allergies   Lisinopril   Review of Systems Review of Systems  Constitutional: Positive for fatigue.  Negative for chills and fever.  HENT: Negative for congestion and rhinorrhea.   Eyes: Negative for visual disturbance.  Respiratory: Positive for shortness of breath. Negative for cough and wheezing.   Cardiovascular: Positive for chest pain. Negative for leg swelling.  Gastrointestinal: Negative for abdominal pain, diarrhea, nausea and vomiting.  Genitourinary: Negative for dysuria and flank pain.  Musculoskeletal: Negative for neck pain and neck stiffness.  Skin: Negative for rash and wound.  Allergic/Immunologic: Negative for immunocompromised state.  Neurological: Negative for syncope, weakness and headaches.     Physical Exam Updated Vital Signs BP 135/97   Pulse 65   Temp 98.3 F (36.8 C) (Oral)   Resp 19   Wt 208 lb 5 oz (94.5 kg)   SpO2 95%   BMI 29.05 kg/m   Physical Exam  Constitutional: He is oriented to person, place, and time. He appears well-developed and well-nourished. No distress.  HENT:  Head: Normocephalic and atraumatic.  Mouth/Throat: Oropharynx is clear and moist.  Eyes: Conjunctivae are normal. Pupils are equal, round, and reactive to light.  Neck: Neck supple.  Cardiovascular: Normal rate, regular rhythm and normal heart sounds.  Exam reveals no friction rub.   No murmur heard. Pulmonary/Chest: Effort normal and breath sounds normal. No respiratory distress. He has no wheezes. He has no rales. He exhibits tenderness (Mild tenderness to palpation over left parasternal intercostal spaces. No bruising or deformity.).  Abdominal: He exhibits no distension. There is no tenderness.  Musculoskeletal: He exhibits no edema.  Neurological: He is alert and oriented to person, place, and time. He exhibits normal muscle tone.  Skin: Skin is warm. Capillary refill takes less than 2 seconds.  Nursing note and vitals reviewed.    ED Treatments / Results  Labs (all labs ordered are listed, but only abnormal results are displayed) Labs Reviewed  BASIC METABOLIC  PANEL - Abnormal; Notable for the following:       Result Value   Glucose, Bld 318 (*)    All other components within normal limits  HEPATIC FUNCTION PANEL - Abnormal; Notable for the following:    Bilirubin, Direct <0.1 (*)    All other components within normal limits  CBC  LIPASE, BLOOD  TROPONIN I  I-STAT TROPOININ, ED    EKG  EKG Interpretation  Date/Time:  Friday April 25 2016 08:52:26 EDT Ventricular Rate:  88 PR Interval:  162 QRS Duration: 88 QT Interval:  356 QTC Calculation: 430 R Axis:   47 Text Interpretation:  Normal sinus rhythm T wave abnormality, consider lateral ischemia Abnormal ECG When compared to prior: No acute changes Confirmed by Ellender Hose MD, Joyanna Kleman [phone]) on 04/25/2016 4:00:42 PM       Radiology Dg Chest 2 View  Result Date: 04/25/2016 CLINICAL DATA:  Left-sided  chest pain and shortness of breath EXAM: CHEST  2 VIEW COMPARISON:  March 22, 2016 FINDINGS: There is a persistent nodular opacity in the left mid lung measuring 8 mm. This nodular opacity is separate from an apparent nipple shadow. Lungs elsewhere clear. Heart size and pulmonary vascularity are normal. No adenopathy. No bone lesions. IMPRESSION: Persistent 8 mm nodular opacity in the left mid lung. Advise noncontrast enhanced chest CT to further assess. No edema or consolidation. Stable cardiac silhouette. Electronically Signed   By: Lowella Grip III M.D.   On: 04/25/2016 09:31   Ct Angio Chest Aorta W And/or Wo Contrast  Result Date: 04/25/2016 CLINICAL DATA:  Left-sided chest pain EXAM: CT ANGIOGRAPHY CHEST WITH CONTRAST TECHNIQUE: Multidetector CT imaging of the chest was performed using the standard protocol during bolus administration of intravenous contrast. Multiplanar CT image reconstructions and MIPs were obtained to evaluate the vascular anatomy. CONTRAST:  100 mL Isovue 370. COMPARISON:  None. FINDINGS: Mediastinum/Lymph Nodes: Thoracic inlet is within normal limits. There are no  findings to suggest hilar or mediastinal adenopathy. Cardiovascular: The thoracic aorta is within normal limits. No aneurysmal dilatation or dissection is seen. The pulmonary artery as visualized shows a normal branching pattern. No filling defects to suggest pulmonary emboli are noted. Lungs/Pleura: No pulmonary mass, infiltrate, or effusion. Upper abdomen: No acute findings. Musculoskeletal: No chest wall mass or suspicious bone lesions identified. Review of the MIP images confirms the above findings. IMPRESSION: No evidence of aortic dissection or pulmonary emboli. No acute abnormality is seen. Electronically Signed   By: Inez Catalina M.D.   On: 04/25/2016 11:24    Procedures Procedures (including critical care time)  Medications Ordered in ED Medications  nitroGLYCERIN (NITROSTAT) SL tablet 0.4 mg (not administered)  aspirin chewable tablet 324 mg (324 mg Oral Given 04/25/16 0941)  iopamidol (ISOVUE-370) 76 % injection (100 mLs  Contrast Given 04/25/16 1055)     Initial Impression / Assessment and Plan / ED Course  I have reviewed the triage vital signs and the nursing notes.  Pertinent labs & imaging results that were available during my care of the patient were reviewed by me and considered in my medical decision making (see chart for details).  Clinical Course  55 year old male with past medical history of hypertension, hyperlipidemia and diabetes who presents with atypical chest pain. On arrival, vital signs are stable and within normal limits. Examination is as above. Patient is very well-appearing and in no acute distress. History and exam is consistent with atypical chest pain, likely musculoskeletal in etiology. However, he does have significant coronary risk factors and his HEART score is 4. Will obtain at least delta troponin and discussed with cardiology. Otherwise, chest x-ray obtained shows no evidence of pneumothorax, pneumonia, and I do not suspect pulmonary etiology. He has no  tachycardia, tachypnea, hypoxia, or signs of PE. Aortic dissection is less likely, but he is markedly hypertension and given his radiation to the arm, there is concern for this.  Labs, imaging as above. CTA PE shows no pulmonary embolism or dissection. Delta troponin is negative. Cardiology has evaluated at length. They recommend outpatient follow-up with scheduled stress test, an outpatient increase in his amlodipine. I will also start him on an aspirin. The patient is in agreement with this plan. Will discharge home.  Final Clinical Impressions(s) / ED Diagnoses   Final diagnoses:  Atypical chest pain    New Prescriptions Discharge Medication List as of 04/25/2016  2:56 PM    START  taking these medications   Details  aspirin 81 MG chewable tablet Chew 1 tablet (81 mg total) by mouth daily., Starting Fri 04/25/2016, Print         Duffy Bruce, MD 04/25/16 1606    Duffy Bruce, MD 04/25/16 [phone]

## 2016-04-25 NOTE — ED Notes (Signed)
Pt. Transported to xray this time.

## 2016-04-25 NOTE — ED Triage Notes (Signed)
Pt c/o L sided CP that radiates to L sided Shoulder onset last night, pt c/o SOB, denies n/v/d, A&O x4

## 2016-04-30 LAB — GLUCOSE, POCT (MANUAL RESULT ENTRY)
POC Glucose: 292 mg/dl — AB (ref 70–99)
POC Glucose: 195 mg/dL — AB (ref 70–99)

## 2016-04-30 NOTE — Congregational Nurse Program (Signed)
Congregational Nurse Program Note  Date of Encounter: 04/17/2016  Past Medical History: Past Medical History:  Diagnosis Date  . Back pain, chronic   . HLD (hyperlipidemia)   . Hypertension   . IDDM (insulin dependent diabetes mellitus) ([place])   . Ruptured lumbar disc     Encounter Details:     CNP Questionnaire - 04/17/16 1725      Patient Demographics   Is this a new or existing patient? Existing   Patient is considered a/an Not Applicable   Race African-American/Black     Patient Assistance   Location of Patient Assistance Not Applicable   Patient's financial/insurance status Low Income;Self-Pay   Uninsured Patient Yes   Interventions Counseled to make appt. with provider   Patient referred to apply for the following financial assistance [employer] insecurities addressed Provided food supplies   Transportation assistance No   Assistance securing medications No   Educational health offerings Chronic disease;Diabetes;Navigating the healthcare system     Encounter Details   Primary purpose of visit Chronic Illness/Condition Visit;Navigating the Healthcare System   Was an Emergency Department visit averted? Not Applicable   Does patient have a medical provider? Yes   Patient referred to Clinic   Was a mental health screening completed? (GAINS tool) No   Does patient have dental issues? No   Does patient have vision issues? No   Does your patient have an abnormal blood pressure today? No   Since previous encounter, have you referred patient for abnormal blood pressure that resulted in a new diagnosis or medication change? No   Does your patient have an abnormal blood glucose today? Yes   Since previous encounter, have you referred patient for abnormal blood glucose that resulted in a new diagnosis or medication change? No   Was there a life-saving intervention made? No      CBG before lunch  195.  Self administered 4 units Novalog

## 2016-04-30 NOTE — Congregational Nurse Program (Signed)
Congregational Nurse Program Note  Date of Encounter: 04/17/2016  Past Medical History: Past Medical History:  Diagnosis Date  . Back pain, chronic   . HLD (hyperlipidemia)   . Hypertension   . IDDM (insulin dependent diabetes mellitus) ([place])   . Ruptured lumbar disc     Encounter Details:     CNP Questionnaire - 04/17/16 1725      Patient Demographics   Is this a new or existing patient? Existing   Patient is considered a/an Not Applicable   Race African-American/Black     Patient Assistance   Location of Patient Assistance Not Applicable   Patient's financial/insurance status Low Income;Self-Pay   Uninsured Patient Yes   Interventions Counseled to make appt. with provider   Patient referred to apply for the following financial assistance [employer] insecurities addressed Provided food supplies   Transportation assistance No   Assistance securing medications No   Educational health offerings Chronic disease;Diabetes;Navigating the healthcare system     Encounter Details   Primary purpose of visit Chronic Illness/Condition Visit;Navigating the Healthcare System   Was an Emergency Department visit averted? Not Applicable   Does patient have a medical provider? Yes   Patient referred to Clinic   Was a mental health screening completed? (GAINS tool) No   Does patient have dental issues? No   Does patient have vision issues? No   Does your patient have an abnormal blood pressure today? No   Since previous encounter, have you referred patient for abnormal blood pressure that resulted in a new diagnosis or medication change? No   Does your patient have an abnormal blood glucose today? Yes   Since previous encounter, have you referred patient for abnormal blood glucose that resulted in a new diagnosis or medication change? No   Was there a life-saving intervention made? No      CBC  292.  Will recheck before lunch.

## 2016-05-06 ENCOUNTER — Telehealth (HOSPITAL_COMMUNITY): Payer: Self-pay | Admitting: *Deleted

## 2016-05-06 NOTE — Telephone Encounter (Signed)
Patient given detailed instructions per Myocardial Perfusion Study Information Sheet for the test on 05/09/16 at 0815. Patient notified to arrive 15 minutes early and that it is imperative to arrive on time for appointment to keep from having the test rescheduled.  If you need to cancel or reschedule your appointment, please call the office within 24 hours of your appointment. Failure to do so may result in a cancellation of your appointment, and a $50 no show fee. Patient verbalized understanding.Ayomide Zuleta, Ranae Palms

## 2016-05-09 ENCOUNTER — Encounter (INDEPENDENT_AMBULATORY_CARE_PROVIDER_SITE_OTHER): Payer: Self-pay

## 2016-05-09 ENCOUNTER — Ambulatory Visit (HOSPITAL_COMMUNITY): Payer: Medicaid Other | Attending: Student

## 2016-05-09 DIAGNOSIS — R9439 Abnormal result of other cardiovascular function study: Secondary | ICD-10-CM | POA: Insufficient documentation

## 2016-05-09 DIAGNOSIS — E109 Type 1 diabetes mellitus without complications: Secondary | ICD-10-CM | POA: Diagnosis not present

## 2016-05-09 DIAGNOSIS — R079 Chest pain, unspecified: Secondary | ICD-10-CM | POA: Diagnosis present

## 2016-05-09 DIAGNOSIS — I10 Essential (primary) hypertension: Secondary | ICD-10-CM | POA: Insufficient documentation

## 2016-05-09 LAB — MYOCARDIAL PERFUSION IMAGING
CHL CUP NUCLEAR SSS: 12
CHL CUP RESTING HR STRESS: 59 {beats}/min
CSEPED: 7 min
CSEPEDS: 9 s
CSEPEW: 8.7 METS
CSEPHR: 91 %
LHR: 0.27
LV sys vol: 123 mL
LVDIAVOL: 190 mL (ref 62–150)
MPHR: 165 {beats}/min
Peak HR: 151 {beats}/min
SDS: 4
SRS: 8
TID: 0.93

## 2016-05-09 MED ORDER — TECHNETIUM TC 99M TETROFOSMIN IV KIT
31.8000 | PACK | Freq: Once | INTRAVENOUS | Status: AC | PRN
  Administered 2016-05-09: 31.8 via INTRAVENOUS
  Filled 2016-05-09: qty 32

## 2016-05-09 MED ORDER — TECHNETIUM TC 99M TETROFOSMIN IV KIT
10.6000 | PACK | Freq: Once | INTRAVENOUS | Status: AC | PRN
  Administered 2016-05-09: 11 via INTRAVENOUS
  Filled 2016-05-09: qty 11

## 2016-05-14 NOTE — Progress Notes (Addendum)
Cardiology Office Note   Date:  05/15/2016   ID:  Ryan Medina, DOB [birth year], MRN [id number]  PCP:  Carmie Kanner, NP  Cardiologist:  Dr. Radford Pax new    Chief Complaint  Patient presents with  . Chest Pain    pt states he had sharp pains in his chest last night   . Shortness of Breath    everyday       History of Present Illness: Ryan Medina is a 55 y.o. male who presents for post ER visit for chest pain.   troponins were neg. CTA was without aortic dissection or PE.     Reported he developed chest discomfort 04/24/16 while watching television which was a shooting pain along his sternum, lasting only 5 seconds. Says he had mild dyspnea after the pain, due to being anxious about his initial symptoms. He went to sleep and did not have any recurrence in pain until the morning seen in ER.. Starting at 0800, he developed the shooting pain again and experienced about 5 episodes of this, all lasting less than 10 seconds each. He reports walking "most of the day" everyday and denies any chest discomfort or dyspnea with exertion during activity.  Pt was discharged from ER with neg. Troponin and outpt studies planned.   He denies any prior cardiac history. Does have HTN, HLD, and IDDM which he says is followed by his PCP. Says his mom and maternal grandmother both had CHF. Smokes 3-4 cigarettes per day and has done so for 20+ years.   With his risk factors we did a nuc study and he had a drop in EF to 36%, and large defect of moderate severity in basal inf. Mid inf apical inf. And apex ischemia.  Dr. Radford Pax has reviewed and recommends a cardiac cath.    Today has continued chest pain episodically - similar to ER visit.   EKG without acute changes.    Long discussion concerning cardiac cath.  With risks/ benefits.   Also discussed stopping tobacco use -he is down to 2 per day and plans to stop.    Past Medical History:  Diagnosis Date  . Back pain, chronic   . HLD (hyperlipidemia)   .  Hypertension   . IDDM (insulin dependent diabetes mellitus) ([place])   . Ruptured lumbar disc     Past Surgical History:  Procedure Laterality Date  . ABDOMINAL SURGERY    . APPENDECTOMY    . BACK SURGERY       Current Outpatient Prescriptions  Medication Sig Dispense Refill  . aspirin 81 MG chewable tablet Chew 1 tablet (81 mg total) by mouth daily. 30 tablet 0  . gabapentin (NEURONTIN) 300 MG capsule Take 1 capsule (300 mg total) by mouth at bedtime. 30 capsule 0  . hydrochlorothiazide (HYDRODIURIL) 25 MG tablet Take 1 tablet (25 mg total) by mouth daily. 30 tablet 0  . insulin aspart (NOVOLOG) 100 UNIT/ML injection Inject 12 Units into the skin 2 (two) times daily at 8 am and 10 pm.    . insulin detemir (LEVEMIR) 100 UNIT/ML injection Inject 10 Units into the skin 2 (two) times daily.    . meloxicam (MOBIC) 15 MG tablet Take 15 mg by mouth daily.    . metFORMIN (GLUCOPHAGE) 1000 MG tablet Take 1 tablet (1,000 mg total) by mouth 2 (two) times daily with a meal. 60 tablet 0  . nitroGLYCERIN (NITROSTAT) 0.4 MG SL tablet Place 0.4 mg under the tongue every 5 (  five) minutes as needed for chest pain (x 3 doses).    . terbinafine (LAMISIL) 1 % cream Apply 1 application topically as directed.     No current facility-administered medications for this visit.     Allergies:   Lisinopril    Social History:  The patient  reports that he has been smoking Cigarettes.  He has a 7.50 pack-year smoking history. He has never used smokeless tobacco. He reports that he does not drink alcohol or use drugs.   Family History:  The patient's family history includes Diabetes in his brother, mother, and sister; Heart failure in his mother.    ROS:  General:no colds or fevers, no weight changes Skin:no rashes or ulcers HEENT:no blurred vision, no congestion CV:see HPI PUL:see HPI GI:no diarrhea constipation or melena, no indigestion GU:no hematuria, no dysuria MS:no joint pain, no  claudication Neuro:no syncope, no lightheadedness Endo:+ diabetes not always conrolled, no thyroid disease  Wt Readings from Last 3 Encounters:  05/15/16 209 lb 12.8 oz (95.2 kg)  04/25/16 208 lb 5 oz (94.5 kg)  03/22/16 205 lb (93 kg)     PHYSICAL EXAM: VS:  BP 116/82   Pulse 89   Ht 5\' 11"  (1.803 m)   Wt 209 lb 12.8 oz (95.2 kg)   SpO2 96%   BMI 29.26 kg/m  , BMI Body mass index is 29.26 kg/m. General:Pleasant affect, NAD Skin:Warm and dry, brisk capillary refill HEENT:normocephalic, sclera clear, mucus membranes moist Neck:supple, no JVD, no bruits  Heart:S1S2 RRR without murmur, gallup, rub or click Lungs:clear without rales, rhonchi, or wheezes [id number], non tender, + BS, do not palpate liver spleen or masses Ext:no lower ext edema, 2+ pedal pulses, 2+ radial pulses Neuro:alert and oriented, MAE, follows commands, + facial symmetry    EKG:  EKG is ordered today. The ekg ordered today demonstrates SR with ST &T wave abnormality.     Recent Labs: 03/22/2016: Magnesium 1.9 04/25/2016: ALT 20; BUN 17; Creatinine, Ser 1.08; Hemoglobin 13.0; Platelets 234; Potassium 4.5; Sodium 136    Lipid Panel No results found for: CHOL, TRIG, HDL, CHOLHDL, VLDL, LDLCALC, LDLDIRECT     Other studies Reviewed: Additional studies/ records that were reviewed today include: ER visit. . Study Highlights     Nuclear stress EF: 36%.  Blood pressure demonstrated a hypertensive response to exercise.  There was no ST segment deviation noted during stress.  Defect 1: There is a large defect of moderate severity present in the basal inferior, mid inferior, apical inferior and apex location.   High risk study, primarily due to moderate reduction in left ventricular systolic function. The left ventricle is mildly dilated. There is no regional variability in left ventricular systolic function, suggesting a non-ischemic cardiomyopathy, but consider multivessel CAD. There is a small fixed  apical defect suggestive of scar.  There is a large inferior defect that is mostly fixed (probably diaphragmatic attenuation), but with a mild component of reversibility, suggesting ischemia in the right coronary artery distribution.      ASSESSMENT AND PLAN:  1.  Chest pain on going with + stress test.  Plan for cardiac cath tomorrow.    The patient understands that risks included but are not limited to stroke (1 in 1000), death (1 in 21), kidney failure [usually temporary] (1 in 500), bleeding (1 in 200), allergic reaction [possibly serious] (1 in 200).   Pt is agreeable to proceed.   I added lopressor 12.5 BID and NTG sl prn. Pt is on  ASA.  He will hold metformin and decrease dose of levemir tonight.  2. abnormal nuc study have recommended cardiac cath.  3. DM- followed by PCP  4. Tobacco use down to 2 cigs. Per day with plans to stop  5. HTN.   6.  8 mm nodular opacity in Lt mid lung- to be followed by PCP.    Could not plan cath for Friday, will don on Monday at 9 AM pt will arrive at 0700.  For Lt heart cath and possible.     Current medicines are reviewed with the patient today.  The patient Has no concerns regarding medicines.  The following changes have been made:  See above Labs/ tests ordered today include:see above  Disposition:   FU:  see above  Signed, Cecilie Kicks, NP  05/15/2016 11:59 AM    [address], [place], [place], [place] Phone: [phone]; Fax: [phone]

## 2016-05-15 ENCOUNTER — Encounter: Payer: Self-pay | Admitting: Cardiology

## 2016-05-15 ENCOUNTER — Ambulatory Visit (INDEPENDENT_AMBULATORY_CARE_PROVIDER_SITE_OTHER): Payer: Self-pay | Admitting: Cardiology

## 2016-05-15 ENCOUNTER — Telehealth: Payer: Self-pay | Admitting: *Deleted

## 2016-05-15 VITALS — BP 116/82 | HR 89 | Ht 71.0 in | Wt 209.8 lb

## 2016-05-15 DIAGNOSIS — R9439 Abnormal result of other cardiovascular function study: Secondary | ICD-10-CM

## 2016-05-15 DIAGNOSIS — R0602 Shortness of breath: Secondary | ICD-10-CM

## 2016-05-15 DIAGNOSIS — I10 Essential (primary) hypertension: Secondary | ICD-10-CM

## 2016-05-15 DIAGNOSIS — Z79899 Other long term (current) drug therapy: Secondary | ICD-10-CM

## 2016-05-15 DIAGNOSIS — R0789 Other chest pain: Secondary | ICD-10-CM

## 2016-05-15 DIAGNOSIS — Z01812 Encounter for preprocedural laboratory examination: Secondary | ICD-10-CM

## 2016-05-15 LAB — BASIC METABOLIC PANEL
BUN: 20 mg/dL (ref 7–25)
CALCIUM: 9.4 mg/dL (ref 8.6–10.3)
CO2: 28 mmol/L (ref 20–31)
Chloride: 102 mmol/L (ref 98–110)
Creat: 0.92 mg/dL (ref 0.70–1.33)
Glucose, Bld: 226 mg/dL — ABNORMAL HIGH (ref 65–99)
POTASSIUM: 4.5 mmol/L (ref 3.5–5.3)
SODIUM: 137 mmol/L (ref 135–146)

## 2016-05-15 LAB — CBC
HEMATOCRIT: 39.7 % (ref 38.5–50.0)
HEMOGLOBIN: 13.5 g/dL (ref 13.2–17.1)
MCH: 29.3 pg (ref 27.0–33.0)
MCHC: 34 g/dL (ref 32.0–36.0)
MCV: 86.3 fL (ref 80.0–100.0)
MPV: 10.9 fL (ref 7.5–12.5)
Platelets: 228 10*3/uL (ref 140–400)
RBC: 4.6 MIL/uL (ref 4.20–5.80)
RDW: 13.3 % (ref 11.0–15.0)
WBC: 4.9 10*3/uL (ref 3.8–10.8)

## 2016-05-15 LAB — PROTIME-INR
INR: 1
PROTHROMBIN TIME: 11 s (ref 9.0–11.5)

## 2016-05-15 LAB — APTT: APTT: 25 s (ref 22–34)

## 2016-05-15 MED ORDER — METOPROLOL TARTRATE 25 MG PO TABS: 12.5000 mg | ORAL_TABLET | Freq: Two times a day (BID) | ORAL | 6 refills | Status: DC

## 2016-05-15 MED ORDER — NITROGLYCERIN 0.4 MG SL SUBL: 0.4000 mg | SUBLINGUAL_TABLET | SUBLINGUAL | 6 refills | Status: DC | PRN

## 2016-05-15 NOTE — Patient Instructions (Addendum)
Your physician has requested that you have a cardiac catheterization tomorrow. Cardiac catheterization is used to diagnose and/or treat various heart conditions. Doctors may recommend this procedure for a number of different reasons. The most common reason is to evaluate chest pain. Chest pain can be a symptom of coronary artery disease (CAD), and cardiac catheterization can show whether plaque is narrowing or blocking your heart's arteries. This procedure is also used to evaluate the valves, as well as measure the blood flow and oxygen levels in different parts of your heart. For further information please visit [email or web address].   Following your catheterization, you will not be allowed to drive for 3 days.  No lifting, pushing, or pulling greater that 10 pounds is allowed for 7 days  You will get pre-cath labs done today here before leaving.  Your physician has recommended you make the following change in your medication: 1.) START NEW PRESCRIPTION WRITTEN [place].  2.) A prescription for nitroglycerin has been given today.  3.) Your levimir has been decreased to 10 units at night.  The new medications have been sent to [facility].     DO NOT TAKE MORNING INSULIN OR METFORMINTOMORROW

## 2016-05-15 NOTE — Addendum Note (Signed)
Addended by: Isaiah Serge on: 05/15/2016 12:42 PM   Modules accepted: Orders, SmartSet

## 2016-05-15 NOTE — Telephone Encounter (Signed)
Called and notified patient there's no availability for cath tomorrow. Ryan Medina scheduled for Monday 05/19/16 to be done by Dr Ellyn Hack. He should arrive at short stay @ 7 AM. He was instructed to continue insulin as well as metformin. He should apply former medication instructions for Monday's cath. Patient voiced verbal understanding of these instructions.

## 2016-05-19 ENCOUNTER — Encounter (HOSPITAL_COMMUNITY)
Admission: RE | Disposition: A | Discharge status: Home / Self Care | Payer: Self-pay | Source: Ambulatory Visit | Attending: Cardiology

## 2016-05-19 ENCOUNTER — Ambulatory Visit (HOSPITAL_COMMUNITY)
Admission: RE | Admit: 2016-05-19 | Discharge: 2016-05-19 | Disposition: A | Discharge status: Home / Self Care | Payer: Medicaid Other | Source: Ambulatory Visit | Attending: Cardiology | Admitting: Cardiology

## 2016-05-19 ENCOUNTER — Encounter (HOSPITAL_COMMUNITY): Payer: Self-pay | Admitting: Cardiology

## 2016-05-19 DIAGNOSIS — I25118 Atherosclerotic heart disease of native coronary artery with other forms of angina pectoris: Secondary | ICD-10-CM

## 2016-05-19 DIAGNOSIS — G8929 Other chronic pain: Secondary | ICD-10-CM | POA: Insufficient documentation

## 2016-05-19 DIAGNOSIS — E785 Hyperlipidemia, unspecified: Secondary | ICD-10-CM | POA: Insufficient documentation

## 2016-05-19 DIAGNOSIS — I208 Other forms of angina pectoris: Secondary | ICD-10-CM | POA: Diagnosis not present

## 2016-05-19 DIAGNOSIS — F17210 Nicotine dependence, cigarettes, uncomplicated: Secondary | ICD-10-CM | POA: Insufficient documentation

## 2016-05-19 DIAGNOSIS — Z7984 Long term (current) use of oral hypoglycemic drugs: Secondary | ICD-10-CM | POA: Diagnosis not present

## 2016-05-19 DIAGNOSIS — E119 Type 2 diabetes mellitus without complications: Secondary | ICD-10-CM | POA: Diagnosis not present

## 2016-05-19 DIAGNOSIS — Z794 Long term (current) use of insulin: Secondary | ICD-10-CM | POA: Insufficient documentation

## 2016-05-19 DIAGNOSIS — Z833 Family history of diabetes mellitus: Secondary | ICD-10-CM | POA: Diagnosis not present

## 2016-05-19 DIAGNOSIS — Z8249 Family history of ischemic heart disease and other diseases of the circulatory system: Secondary | ICD-10-CM | POA: Diagnosis not present

## 2016-05-19 DIAGNOSIS — I10 Essential (primary) hypertension: Secondary | ICD-10-CM | POA: Diagnosis present

## 2016-05-19 DIAGNOSIS — R918 Other nonspecific abnormal finding of lung field: Secondary | ICD-10-CM | POA: Diagnosis not present

## 2016-05-19 DIAGNOSIS — Z7982 Long term (current) use of aspirin: Secondary | ICD-10-CM | POA: Insufficient documentation

## 2016-05-19 DIAGNOSIS — I2089 Other forms of angina pectoris: Secondary | ICD-10-CM | POA: Diagnosis present

## 2016-05-19 DIAGNOSIS — E1165 Type 2 diabetes mellitus with hyperglycemia: Secondary | ICD-10-CM | POA: Diagnosis present

## 2016-05-19 DIAGNOSIS — M549 Dorsalgia, unspecified: Secondary | ICD-10-CM | POA: Insufficient documentation

## 2016-05-19 DIAGNOSIS — IMO0002 Reserved for concepts with insufficient information to code with codable children: Secondary | ICD-10-CM | POA: Diagnosis present

## 2016-05-19 DIAGNOSIS — R9439 Abnormal result of other cardiovascular function study: Secondary | ICD-10-CM | POA: Diagnosis present

## 2016-05-19 HISTORY — PX: CARDIAC CATHETERIZATION: SHX172

## 2016-05-19 LAB — GLUCOSE, CAPILLARY
GLUCOSE-CAPILLARY: 191 mg/dL — AB (ref 65–99)
Glucose-Capillary: 161 mg/dL — ABNORMAL HIGH (ref 65–99)

## 2016-05-19 SURGERY — LEFT HEART CATH AND CORONARY ANGIOGRAPHY
Anesthesia: LOCAL

## 2016-05-19 MED ORDER — MIDAZOLAM HCL 2 MG/2ML IJ SOLN
INTRAMUSCULAR | Status: AC
  Filled 2016-05-19: qty 2

## 2016-05-19 MED ORDER — HEPARIN (PORCINE) IN NACL 2-0.9 UNIT/ML-% IJ SOLN
INTRAMUSCULAR | Status: DC | PRN
  Administered 2016-05-19: 1500 mL

## 2016-05-19 MED ORDER — FENTANYL CITRATE (PF) 100 MCG/2ML IJ SOLN
INTRAMUSCULAR | Status: DC | PRN
  Administered 2016-05-19: 50 ug via INTRAVENOUS

## 2016-05-19 MED ORDER — HEPARIN SODIUM (PORCINE) 1000 UNIT/ML IJ SOLN
INTRAMUSCULAR | Status: DC | PRN
  Administered 2016-05-19: 5000 [IU] via INTRAVENOUS

## 2016-05-19 MED ORDER — VERAPAMIL HCL 2.5 MG/ML IV SOLN
INTRAVENOUS | Status: DC | PRN
  Administered 2016-05-19: 10 mL via INTRA_ARTERIAL

## 2016-05-19 MED ORDER — VERAPAMIL HCL 2.5 MG/ML IV SOLN
INTRAVENOUS | Status: AC
  Filled 2016-05-19: qty 2

## 2016-05-19 MED ORDER — HEPARIN SODIUM (PORCINE) 1000 UNIT/ML IJ SOLN
INTRAMUSCULAR | Status: AC
  Filled 2016-05-19: qty 1

## 2016-05-19 MED ORDER — ASPIRIN 81 MG PO CHEW
81.0000 mg | CHEWABLE_TABLET | ORAL | Status: AC
Start: 2016-05-20 — End: 2016-05-19
  Administered 2016-05-19: 81 mg via ORAL

## 2016-05-19 MED ORDER — LIDOCAINE HCL (PF) 1 % IJ SOLN
INTRAMUSCULAR | Status: DC | PRN
  Administered 2016-05-19: 2 mL via INTRADERMAL

## 2016-05-19 MED ORDER — ASPIRIN 81 MG PO CHEW
CHEWABLE_TABLET | ORAL | Status: AC
  Administered 2016-05-19: 81 mg via ORAL
  Filled 2016-05-19: qty 1

## 2016-05-19 MED ORDER — SODIUM CHLORIDE 0.9 % IV SOLN: 250.0000 mL | INTRAVENOUS | Status: DC | PRN

## 2016-05-19 MED ORDER — SODIUM CHLORIDE 0.9 % IV SOLN
250.0000 mL | INTRAVENOUS | Status: DC | PRN
Start: 2016-05-19 — End: 2016-05-19

## 2016-05-19 MED ORDER — MIDAZOLAM HCL 2 MG/2ML IJ SOLN
INTRAMUSCULAR | Status: DC | PRN
  Administered 2016-05-19: 2 mg via INTRAVENOUS

## 2016-05-19 MED ORDER — FENTANYL CITRATE (PF) 100 MCG/2ML IJ SOLN
INTRAMUSCULAR | Status: AC
  Filled 2016-05-19: qty 2

## 2016-05-19 MED ORDER — SODIUM CHLORIDE 0.9 % WEIGHT BASED INFUSION: 3.0000 mL/kg/h | INTRAVENOUS | Status: DC

## 2016-05-19 MED ORDER — LIDOCAINE HCL (PF) 1 % IJ SOLN
INTRAMUSCULAR | Status: AC
  Filled 2016-05-19: qty 30

## 2016-05-19 MED ORDER — IOPAMIDOL (ISOVUE-370) INJECTION 76%
INTRAVENOUS | Status: AC
  Filled 2016-05-19: qty 100

## 2016-05-19 MED ORDER — SODIUM CHLORIDE 0.9% FLUSH: 3.0000 mL | INTRAVENOUS | Status: DC | PRN

## 2016-05-19 MED ORDER — HEPARIN (PORCINE) IN NACL 2-0.9 UNIT/ML-% IJ SOLN
INTRAMUSCULAR | Status: AC
  Filled 2016-05-19: qty 1500

## 2016-05-19 MED ORDER — SODIUM CHLORIDE 0.9% FLUSH: 3.0000 mL | Freq: Two times a day (BID) | INTRAVENOUS | Status: DC

## 2016-05-19 MED ORDER — ONDANSETRON HCL 4 MG/2ML IJ SOLN: 4.0000 mg | Freq: Four times a day (QID) | INTRAMUSCULAR | Status: DC | PRN

## 2016-05-19 MED ORDER — SODIUM CHLORIDE 0.9 % WEIGHT BASED INFUSION
3.0000 mL/kg/h | INTRAVENOUS | Status: AC
  Administered 2016-05-19: 3 mL/kg/h via INTRAVENOUS

## 2016-05-19 MED ORDER — IOPAMIDOL (ISOVUE-370) INJECTION 76%
INTRAVENOUS | Status: DC | PRN
  Administered 2016-05-19: 110 mL via INTRAVENOUS

## 2016-05-19 MED ORDER — ACETAMINOPHEN 325 MG PO TABS: 650.0000 mg | ORAL_TABLET | ORAL | Status: DC | PRN

## 2016-05-19 MED ORDER — SODIUM CHLORIDE 0.9 % WEIGHT BASED INFUSION: 1.0000 mL/kg/h | INTRAVENOUS | Status: DC

## 2016-05-19 MED ORDER — IOPAMIDOL (ISOVUE-370) INJECTION 76%
INTRAVENOUS | Status: AC
  Filled 2016-05-19: qty 50

## 2016-05-19 SURGICAL SUPPLY — 10 items
CATH INFINITI 5FR ANG PIGTAIL (CATHETERS) ×2 IMPLANT
CATH OPTITORQUE TIG 4.0 5F (CATHETERS) ×2 IMPLANT
DEVICE RAD COMP TR BAND LRG (VASCULAR PRODUCTS) ×2 IMPLANT
GLIDESHEATH SLEND A-KIT 6F 22G (SHEATH) ×2 IMPLANT
KIT HEART LEFT (KITS) ×2 IMPLANT
PACK CARDIAC CATHETERIZATION (CUSTOM PROCEDURE TRAY) ×2 IMPLANT
SYR MEDRAD MARK V 150ML (SYRINGE) ×2 IMPLANT
TRANSDUCER W/STOPCOCK (MISCELLANEOUS) ×2 IMPLANT
TUBING CIL FLEX 10 FLL-RA (TUBING) ×2 IMPLANT
WIRE EMERALD 3MM-J .035X260CM (WIRE) ×2 IMPLANT

## 2016-05-19 NOTE — Interval H&P Note (Signed)
History and Physical Interval Note:  05/19/2016 7:31 AM  Ryan Medina  has presented today for surgery, with the diagnosis of CP - Atypical Angina, abnormal nuclear stress test, inferior ischemia  The various methods of treatment have been discussed with the patient and family. After consideration of risks, benefits and other options for treatment, the patient has consented to  Procedure(s): Left Heart Cath and Coronary Angiography (N/A) with possible percutaneous coronary intervention as a surgical intervention .  The patient's history has been reviewed, patient examined, no change in status, stable for surgery.  I have reviewed the patient's chart and labs.  Questions were answered to the patient's satisfaction.    Cath Lab Visit (complete for each Cath Lab visit)  Clinical Evaluation Leading to the Procedure:   ACS: No.  Non-ACS:    Anginal Classification: CCS III  Anti-ischemic medical therapy: Minimal Therapy (1 class of medications)  Non-Invasive Test Results: High-risk stress test findings: cardiac mortality >3%/year  Prior CABG: No previous CABG   Ischemic Symptoms? CCS III (Marked limitation of ordinary activity) Anti-ischemic Medical Therapy? Minimal Therapy (1 class of medications) Non-invasive Test Results? High-risk stress test findings: cardiac mortality >3%/yr Prior CABG? No Previous CABG   Patient Information:   1-2V CAD, no prox LAD  A (8)  Indication: 18; Score: 8   Patient Information:   CTO of 1 vessel, no other CAD  A (7)  Indication: 28; Score: 7   Patient Information:   1V CAD with prox LAD  A (9)  Indication: 34; Score: 9   Patient Information:   2V-CAD with prox LAD  A (9)  Indication: 40; Score: 9   Patient Information:   3V-CAD without LMCA  A (9)  Indication: 46; Score: 9   Patient Information:   3V-CAD without LMCA With Abnormal LV systolic function  A (9)  Indication: 48; Score: 9   Patient Information:    LMCA-CAD  A (9)  Indication: 49; Score: 9   Patient Information:   2V-CAD with prox LAD PCI  A (7)  Indication: 62; Score: 7   Patient Information:   2V-CAD with prox LAD CABG  A (8)  Indication: 62; Score: 8   Patient Information:   3V-CAD without LMCA With Low CAD burden(i.e., 3 focal stenoses, low SYNTAX score) PCI  A (7)  Indication: 63; Score: 7   Patient Information:   3V-CAD without LMCA With Low CAD burden(i.e., 3 focal stenoses, low SYNTAX score) CABG  A (9)  Indication: 63; Score: 9   Patient Information:   3V-CAD without LMCA E06c - Intermediate-high CAD burden (i.e., multiple diffuse lesions, presence of CTO, or high SYNTAX score) PCI  U (4)  Indication: 64; Score: 4   Patient Information:   3V-CAD without LMCA E06c - Intermediate-high CAD burden (i.e., multiple diffuse lesions, presence of CTO, or high SYNTAX score) CABG  A (9)  Indication: 64; Score: 9   Patient Information:   LMCA-CAD With Isolated LMCA stenosis  PCI  U (6)  Indication: 65; Score: 6   Patient Information:   LMCA-CAD With Isolated LMCA stenosis  CABG  A (9)  Indication: 65; Score: 9   Patient Information:   LMCA-CAD Additional CAD, low CAD burden (i.e., 1- to 2-vessel additional involvement, low SYNTAX score) PCI  U (5)  Indication: 66; Score: 5   Patient Information:   LMCA-CAD Additional CAD, low CAD burden (i.e., 1- to 2-vessel additional involvement, low SYNTAX score) CABG  A (9)  Indication: 66; Score: 9   Patient Information:   LMCA-CAD Additional CAD, intermediate-high CAD burden (i.e., 3-vessel involvement, presence of CTO, or high SYNTAX score) PCI  I (3)  Indication: 67; Score: 3   Patient Information:   LMCA-CAD Additional CAD, intermediate-high CAD burden (i.e., 3-vessel involvement, presence of CTO, or high SYNTAX score) CABG  A (9)  Indication: 67; Score: 9    Glenetta Hew

## 2016-05-19 NOTE — H&P (View-Only) (Signed)
Cardiology Office Note   Date:  05/15/2016   ID:  Ryan Medina, DOB [birth year], MRN [id number]  PCP:  Carmie Kanner, NP  Cardiologist:  Dr. Radford Pax new    Chief Complaint  Patient presents with  . Chest Pain    pt states he had sharp pains in his chest last night   . Shortness of Breath    everyday       History of Present Illness: Ryan Medina is a 55 y.o. male who presents for post ER visit for chest pain.   troponins were neg. CTA was without aortic dissection or PE.     Reported he developed chest discomfort 04/24/16 while watching television which was a shooting pain along his sternum, lasting only 5 seconds. Says he had mild dyspnea after the pain, due to being anxious about his initial symptoms. He went to sleep and did not have any recurrence in pain until the morning seen in ER.. Starting at 0800, he developed the shooting pain again and experienced about 5 episodes of this, all lasting less than 10 seconds each. He reports walking "most of the day" everyday and denies any chest discomfort or dyspnea with exertion during activity.  Pt was discharged from ER with neg. Troponin and outpt studies planned.   He denies any prior cardiac history. Does have HTN, HLD, and IDDM which he says is followed by his PCP. Says his mom and maternal grandmother both had CHF. Smokes 3-4 cigarettes per day and has done so for 20+ years.   With his risk factors we did a nuc study and he had a drop in EF to 36%, and large defect of moderate severity in basal inf. Mid inf apical inf. And apex ischemia.  Dr. Radford Pax has reviewed and recommends a cardiac cath.    Today has continued chest pain episodically - similar to ER visit.   EKG without acute changes.    Long discussion concerning cardiac cath.  With risks/ benefits.   Also discussed stopping tobacco use -he is down to 2 per day and plans to stop.    Past Medical History:  Diagnosis Date  . Back pain, chronic   . HLD (hyperlipidemia)   .  Hypertension   . IDDM (insulin dependent diabetes mellitus) ([place])   . Ruptured lumbar disc     Past Surgical History:  Procedure Laterality Date  . ABDOMINAL SURGERY    . APPENDECTOMY    . BACK SURGERY       Current Outpatient Prescriptions  Medication Sig Dispense Refill  . aspirin 81 MG chewable tablet Chew 1 tablet (81 mg total) by mouth daily. 30 tablet 0  . gabapentin (NEURONTIN) 300 MG capsule Take 1 capsule (300 mg total) by mouth at bedtime. 30 capsule 0  . hydrochlorothiazide (HYDRODIURIL) 25 MG tablet Take 1 tablet (25 mg total) by mouth daily. 30 tablet 0  . insulin aspart (NOVOLOG) 100 UNIT/ML injection Inject 12 Units into the skin 2 (two) times daily at 8 am and 10 pm.    . insulin detemir (LEVEMIR) 100 UNIT/ML injection Inject 10 Units into the skin 2 (two) times daily.    . meloxicam (MOBIC) 15 MG tablet Take 15 mg by mouth daily.    . metFORMIN (GLUCOPHAGE) 1000 MG tablet Take 1 tablet (1,000 mg total) by mouth 2 (two) times daily with a meal. 60 tablet 0  . nitroGLYCERIN (NITROSTAT) 0.4 MG SL tablet Place 0.4 mg under the tongue every 5 (  five) minutes as needed for chest pain (x 3 doses).    . terbinafine (LAMISIL) 1 % cream Apply 1 application topically as directed.     No current facility-administered medications for this visit.     Allergies:   Lisinopril    Social History:  The patient  reports that he has been smoking Cigarettes.  He has a 7.50 pack-year smoking history. He has never used smokeless tobacco. He reports that he does not drink alcohol or use drugs.   Family History:  The patient's family history includes Diabetes in his brother, mother, and sister; Heart failure in his mother.    ROS:  General:no colds or fevers, no weight changes Skin:no rashes or ulcers HEENT:no blurred vision, no congestion CV:see HPI PUL:see HPI GI:no diarrhea constipation or melena, no indigestion GU:no hematuria, no dysuria MS:no joint pain, no  claudication Neuro:no syncope, no lightheadedness Endo:+ diabetes not always conrolled, no thyroid disease  Wt Readings from Last 3 Encounters:  05/15/16 209 lb 12.8 oz (95.2 kg)  04/25/16 208 lb 5 oz (94.5 kg)  03/22/16 205 lb (93 kg)     PHYSICAL EXAM: VS:  BP 116/82   Pulse 89   Ht 5\' 11"  (1.803 m)   Wt 209 lb 12.8 oz (95.2 kg)   SpO2 96%   BMI 29.26 kg/m  , BMI Body mass index is 29.26 kg/m. General:Pleasant affect, NAD Skin:Warm and dry, brisk capillary refill HEENT:normocephalic, sclera clear, mucus membranes moist Neck:supple, no JVD, no bruits  Heart:S1S2 RRR without murmur, gallup, rub or click Lungs:clear without rales, rhonchi, or wheezes [id number], non tender, + BS, do not palpate liver spleen or masses Ext:no lower ext edema, 2+ pedal pulses, 2+ radial pulses Neuro:alert and oriented, MAE, follows commands, + facial symmetry    EKG:  EKG is ordered today. The ekg ordered today demonstrates SR with ST &T wave abnormality.     Recent Labs: 03/22/2016: Magnesium 1.9 04/25/2016: ALT 20; BUN 17; Creatinine, Ser 1.08; Hemoglobin 13.0; Platelets 234; Potassium 4.5; Sodium 136    Lipid Panel No results found for: CHOL, TRIG, HDL, CHOLHDL, VLDL, LDLCALC, LDLDIRECT     Other studies Reviewed: Additional studies/ records that were reviewed today include: ER visit. . Study Highlights     Nuclear stress EF: 36%.  Blood pressure demonstrated a hypertensive response to exercise.  There was no ST segment deviation noted during stress.  Defect 1: There is a large defect of moderate severity present in the basal inferior, mid inferior, apical inferior and apex location.   High risk study, primarily due to moderate reduction in left ventricular systolic function. The left ventricle is mildly dilated. There is no regional variability in left ventricular systolic function, suggesting a non-ischemic cardiomyopathy, but consider multivessel CAD. There is a small fixed  apical defect suggestive of scar.  There is a large inferior defect that is mostly fixed (probably diaphragmatic attenuation), but with a mild component of reversibility, suggesting ischemia in the right coronary artery distribution.      ASSESSMENT AND PLAN:  1.  Chest pain on going with + stress test.  Plan for cardiac cath tomorrow.    The patient understands that risks included but are not limited to stroke (1 in 1000), death (1 in 21), kidney failure [usually temporary] (1 in 500), bleeding (1 in 200), allergic reaction [possibly serious] (1 in 200).   Pt is agreeable to proceed.   I added lopressor 12.5 BID and NTG sl prn. Pt is on  ASA.  He will hold metformin and decrease dose of levemir tonight.  2. abnormal nuc study have recommended cardiac cath.  3. DM- followed by PCP  4. Tobacco use down to 2 cigs. Per day with plans to stop  5. HTN.   6.  8 mm nodular opacity in Lt mid lung- to be followed by PCP.    Could not plan cath for Friday, will don on Monday at 9 AM pt will arrive at 0700.  For Lt heart cath and possible.     Current medicines are reviewed with the patient today.  The patient Has no concerns regarding medicines.  The following changes have been made:  See above Labs/ tests ordered today include:see above  Disposition:   FU:  see above  Signed, Cecilie Kicks, NP  05/15/2016 11:59 AM    [facility] [address], [place], [place], [place] Phone: [phone]; Fax: [phone]

## 2016-05-19 NOTE — Addendum Note (Signed)
Addended by: Zebedee Iba on: 05/19/2016 12:20 PM   Modules accepted: Orders

## 2016-05-19 NOTE — Discharge Instructions (Signed)
NO METFORMIN/GLUCOPHAGE FOR 2 DAYS ° ° ° °Radial Site Care °Refer to this sheet in the next few weeks. These instructions provide you with information about caring for yourself after your procedure. Your health care provider may also give you more specific instructions. Your treatment has been planned according to current medical practices, but problems sometimes occur. Call your health care provider if you have any problems or questions after your procedure. °WHAT TO EXPECT AFTER THE PROCEDURE °After your procedure, it is typical to have the following: °· Bruising at the radial site that usually fades within 1-2 weeks. °· Blood collecting in the tissue (hematoma) that may be painful to the touch. It should usually decrease in size and tenderness within 1-2 weeks. °HOME CARE INSTRUCTIONS °· Take medicines only as directed by your health care provider. °· You may shower 24-48 hours after the procedure or as directed by your health care provider. Remove the bandage (dressing) and gently wash the site with plain soap and water. Pat the area dry with a clean towel. Do not rub the site, because this may cause bleeding. °· Do not take baths, swim, or use a hot tub until your health care provider approves. °· Check your insertion site every day for redness, swelling, or drainage. °· Do not apply powder or lotion to the site. °· Do not flex or bend the affected arm for 24 hours or as directed by your health care provider. °· Do not push or pull heavy objects with the affected arm for 24 hours or as directed by your health care provider. °· Do not lift over 10 lb (4.5 kg) for 5 days after your procedure or as directed by your health care provider. °· Ask your health care provider when it is okay to: °¨ Return to work or school. °¨ Resume usual physical activities or sports. °¨ Resume sexual activity. °· Do not drive home if you are discharged the same day as the procedure. Have someone else drive you. °· You may drive 24  hours after the procedure unless otherwise instructed by your health care provider. °· Do not operate machinery or power tools for 24 hours after the procedure. °· If your procedure was done as an outpatient procedure, which means that you went home the same day as your procedure, a responsible adult should be with you for the first 24 hours after you arrive home. °· Keep all follow-up visits as directed by your health care provider. This is important. °SEEK MEDICAL CARE IF: °· You have a fever. °· You have chills. °· You have increased bleeding from the radial site. Hold pressure on the site. °SEEK IMMEDIATE MEDICAL CARE IF: °· You have unusual pain at the radial site. °· You have redness, warmth, or swelling at the radial site. °· You have drainage (other than a small amount of blood on the dressing) from the radial site. °· The radial site is bleeding, and the bleeding does not stop after 30 minutes of holding steady pressure on the site. °· Your arm or hand becomes pale, cool, tingly, or numb. °  °This information is not intended to replace advice given to you by your health care provider. Make sure you discuss any questions you have with your health care provider. °  °Document Released: 10/11/2010 Document Revised: 09/29/2014 Document Reviewed: 03/27/2014 °Elsevier Interactive Patient Education ©2016 [employer] ° °

## 2016-05-19 NOTE — Research (Signed)
CADLAD Informed Consent   Subject Name: Ryan Medina  Subject met inclusion and exclusion criteria.  The informed consent form, study requirements and expectations were reviewed with the subject and questions and concerns were addressed prior to the signing of the consent form.  The subject verbalized understanding of the trail requirements.  The subject agreed to participate in the CADLAD trial and signed the informed consent.  The informed consent was obtained prior to performance of any protocol-specific procedures for the subject.  A copy of the signed informed consent was given to the subject and a copy was placed in the subject's medical record.  [name] 05/19/2016, [phone]

## 2016-05-20 ENCOUNTER — Encounter: Payer: Self-pay | Admitting: Pediatric Intensive Care

## 2016-05-20 DIAGNOSIS — Z139 Encounter for screening, unspecified: Secondary | ICD-10-CM

## 2016-05-21 ENCOUNTER — Other Ambulatory Visit: Payer: Self-pay

## 2016-05-21 NOTE — Addendum Note (Signed)
Addended by: Zebedee Iba on: 05/21/2016 03:22 PM   Modules accepted: Orders

## 2016-05-23 NOTE — Congregational Nurse Program (Signed)
Congregational Nurse Program Note  Date of Encounter: 05/20/2016  Past Medical History: Past Medical History:  Diagnosis Date  . Back pain, chronic   . HLD (hyperlipidemia)   . Hypertension   . IDDM (insulin dependent diabetes mellitus) ([place])   . Ruptured lumbar disc     Encounter Details:     CNP Questionnaire - 05/20/16 0830      Patient Demographics   Is this a new or existing patient? Existing   Patient is considered a/an Not Applicable   Race African-American/Black     Patient Assistance   Location of Patient Assistance [facility]   Patient's financial/insurance status Low Income;Self-Pay   Uninsured Patient Yes   Interventions Assisted patient in making appt.   Patient referred to apply for the following financial assistance Not Applicable   Food insecurities addressed Not Applicable   Transportation assistance No   Assistance securing medications No   Educational health offerings Navigating the healthcare system     Encounter Details   Primary purpose of visit [facility]   Was an Emergency Department visit averted? Not Applicable   Does patient have a medical provider? Yes   Patient referred to Clinic   Was a mental health screening completed? (GAINS tool) No   Does patient have dental issues? No   Does patient have vision issues? No   Does your patient have an abnormal blood pressure today? No   Since previous encounter, have you referred patient for abnormal blood pressure that resulted in a new diagnosis or medication change? No   Does your patient have an abnormal blood glucose today? Yes   Since previous encounter, have you referred patient for abnormal blood glucose that resulted in a new diagnosis or medication change? No   Was there a life-saving intervention made? 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 Client states that he normally gets diabetes supplies and meds from [facility] but can't today. He is S/P cath  yesterday. CN contacted [facility] and the NP will see client later this morning. BG per nursing students was 288.

## 2016-05-26 ENCOUNTER — Encounter (HOSPITAL_COMMUNITY): Payer: Self-pay | Admitting: Emergency Medicine

## 2016-05-26 ENCOUNTER — Emergency Department (HOSPITAL_COMMUNITY): Payer: Medicaid Other

## 2016-05-26 ENCOUNTER — Emergency Department (HOSPITAL_COMMUNITY)
Admission: EM | Admit: 2016-05-26 | Discharge: 2016-05-27 | Disposition: A | Discharge status: Home / Self Care | Payer: Medicaid Other | Attending: Emergency Medicine | Admitting: Emergency Medicine

## 2016-05-26 DIAGNOSIS — I10 Essential (primary) hypertension: Secondary | ICD-10-CM | POA: Diagnosis not present

## 2016-05-26 DIAGNOSIS — F17210 Nicotine dependence, cigarettes, uncomplicated: Secondary | ICD-10-CM | POA: Insufficient documentation

## 2016-05-26 DIAGNOSIS — R739 Hyperglycemia, unspecified: Secondary | ICD-10-CM

## 2016-05-26 DIAGNOSIS — R1010 Upper abdominal pain, unspecified: Secondary | ICD-10-CM | POA: Diagnosis not present

## 2016-05-26 DIAGNOSIS — Z7982 Long term (current) use of aspirin: Secondary | ICD-10-CM | POA: Diagnosis not present

## 2016-05-26 DIAGNOSIS — E1065 Type 1 diabetes mellitus with hyperglycemia: Secondary | ICD-10-CM | POA: Insufficient documentation

## 2016-05-26 DIAGNOSIS — R079 Chest pain, unspecified: Secondary | ICD-10-CM

## 2016-05-26 LAB — CBC WITH DIFFERENTIAL/PLATELET
BASOS ABS: 0 10*3/uL (ref 0.0–0.1)
BASOS PCT: 0 %
EOS PCT: 3 %
Eosinophils Absolute: 0.1 10*3/uL (ref 0.0–0.7)
HCT: 37.4 % — ABNORMAL LOW (ref 39.0–52.0)
Hemoglobin: 12.2 g/dL — ABNORMAL LOW (ref 13.0–17.0)
LYMPHS PCT: 46 %
Lymphs Abs: 2.3 10*3/uL (ref 0.7–4.0)
MCH: 28.6 pg (ref 26.0–34.0)
MCHC: 32.6 g/dL (ref 30.0–36.0)
MCV: 87.8 fL (ref 78.0–100.0)
Monocytes Absolute: 0.3 10*3/uL (ref 0.1–1.0)
Monocytes Relative: 7 %
Neutro Abs: 2.2 10*3/uL (ref 1.7–7.7)
Neutrophils Relative %: 44 %
PLATELETS: 232 10*3/uL (ref 150–400)
RBC: 4.26 MIL/uL (ref 4.22–5.81)
RDW: 12.9 % (ref 11.5–15.5)
WBC: 5 10*3/uL (ref 4.0–10.5)

## 2016-05-26 LAB — CBG MONITORING, ED: GLUCOSE-CAPILLARY: 395 mg/dL — AB (ref 65–99)

## 2016-05-26 LAB — COMPREHENSIVE METABOLIC PANEL
ALT: 14 U/L — AB (ref 17–63)
AST: 13 U/L — ABNORMAL LOW (ref 15–41)
Albumin: 3.4 g/dL — ABNORMAL LOW (ref 3.5–5.0)
Alkaline Phosphatase: 54 U/L (ref 38–126)
Anion gap: 4 — ABNORMAL LOW (ref 5–15)
BUN: 18 mg/dL (ref 6–20)
CALCIUM: 8.7 mg/dL — AB (ref 8.9–10.3)
CHLORIDE: 103 mmol/L (ref 101–111)
CO2: 26 mmol/L (ref 22–32)
CREATININE: 1.11 mg/dL (ref 0.61–1.24)
Glucose, Bld: 408 mg/dL — ABNORMAL HIGH (ref 65–99)
Potassium: 3.7 mmol/L (ref 3.5–5.1)
Sodium: 133 mmol/L — ABNORMAL LOW (ref 135–145)
Total Bilirubin: 0.7 mg/dL (ref 0.3–1.2)
Total Protein: 6 g/dL — ABNORMAL LOW (ref 6.5–8.1)

## 2016-05-26 LAB — I-STAT TROPONIN, ED: TROPONIN I, POC: 0 ng/mL (ref 0.00–0.08)

## 2016-05-26 LAB — LIPASE, BLOOD: LIPASE: 43 U/L (ref 11–51)

## 2016-05-26 MED ORDER — GI COCKTAIL ~~LOC~~
30.0000 mL | Freq: Once | ORAL | Status: AC
  Administered 2016-05-26: 30 mL via ORAL
  Filled 2016-05-26: qty 30

## 2016-05-26 MED ORDER — SODIUM CHLORIDE 0.9 % IV BOLUS (SEPSIS)
1000.0000 mL | Freq: Once | INTRAVENOUS | Status: AC
  Administered 2016-05-26: 1000 mL via INTRAVENOUS

## 2016-05-26 NOTE — ED Provider Notes (Signed)
[facility] DEPT Provider Note   CSN: [id number] Arrival date & time: 05/26/16  2024     History   Chief Complaint No chief complaint on file.   HPI Ryan Medina is a 55 y.o. male presenting by EMS with chest pain. Started around 6 PM (2-1/2 hours ago). Patient states he was with his fiance when the pain started. Physical sharp pain in his left lower chest. Went and had a hot dog and the pain immediately worsened. No nausea or vomiting. There is associated shortness of breath. No back pain. Similar to the pain he had when he was seen in the ED in early August as well as when he had his heart cath one week ago. Heart cath was unremarkable. Tried 2 nitros before the hot dog and EMS tried 2 nitro. No relief. Dizzy after the nitro.  Leonie Man, MD (Primary)    Procedures   Left Heart Cath and Coronary Angiography  Conclusion     The left ventricular systolic function is normal. The left ventricular ejection fraction is 55-65% by visual estimate.  LV end diastolic pressure is normal.  There is no aortic valve stenosis. There is no aortic valve regurgitation.  There is no mitral valve regurgitation.  Angiographically normal coronary arteries with extremely large RCA and Circumflex. Comparatively moderate caliber LAD.     HPI  Past Medical History:  Diagnosis Date  . Back pain, chronic   . HLD (hyperlipidemia)   . Hypertension   . IDDM (insulin dependent diabetes mellitus) ([place])   . Ruptured lumbar disc     Patient Active Problem List   Diagnosis Date Noted  . Abnormal nuclear stress test 05/19/2016  . Atypical angina ([place]) 04/25/2016  . DM (diabetes mellitus), type 2, uncontrolled ([place]) 03/14/2015  . Essential hypertension 03/14/2015  . Hyperglycemia 01/14/2015    Past Surgical History:  Procedure Laterality Date  . ABDOMINAL SURGERY    . APPENDECTOMY    . BACK SURGERY    . CARDIAC CATHETERIZATION N/A 05/19/2016   Procedure: Left Heart Cath and  Coronary Angiography;  Surgeon: Leonie Man, MD;  Location: [facility];  Service: Cardiovascular;  Laterality: N/A;       Home Medications    Prior to Admission medications   Medication Sig Start Date End Date Taking? Authorizing Provider  amLODipine (NORVASC) 10 MG tablet Take 10 mg by mouth daily.    Historical Provider, MD  aspirin 81 MG chewable tablet Chew 1 tablet (81 mg total) by mouth daily. 04/25/16   Duffy Bruce, MD  cyclobenzaprine (FLEXERIL) 10 MG tablet Take 10 mg by mouth daily.    Historical Provider, MD  gabapentin (NEURONTIN) 300 MG capsule Take 1 capsule (300 mg total) by mouth at bedtime. 03/22/16   Isla Pence, MD  hydrochlorothiazide (HYDRODIURIL) 25 MG tablet Take 1 tablet (25 mg total) by mouth daily. 03/22/16   Isla Pence, MD  insulin aspart (NOVOLOG) 100 UNIT/ML injection Inject 12 Units into the skin 2 (two) times daily at 8 am and 10 pm.    Historical Provider, MD  insulin detemir (LEVEMIR) 100 UNIT/ML injection Inject 10 Units into the skin 2 (two) times daily.    Historical Provider, MD  meloxicam (MOBIC) 15 MG tablet Take 15 mg by mouth daily.    Historical Provider, MD  metFORMIN (GLUCOPHAGE) 1000 MG tablet Take 1 tablet (1,000 mg total) by mouth 2 (two) times daily with a meal. 03/22/16   Isla Pence, MD  nitroGLYCERIN (NITROSTAT) 0.4 MG SL tablet Place 0.4 mg under the tongue every 5 (five) minutes as needed for chest pain (x 3 doses).    Historical Provider, MD    Family History Family History  Problem Relation Age of Onset  . Diabetes Mother   . Heart failure Mother   . Diabetes Sister   . Diabetes Brother     Social History Social History  Substance Use Topics  . Smoking status: Current Some Day Smoker    Packs/day: 0.30    Years: 25.00    Types: Cigarettes  . Smokeless tobacco: Never Used  . Alcohol use No     Allergies   Lisinopril   Review of Systems Review of Systems  Constitutional: Negative for diaphoresis and  fever.  Respiratory: Positive for shortness of breath.   Cardiovascular: Positive for chest pain.  Gastrointestinal: Negative for nausea and vomiting.  Musculoskeletal: Negative for back pain.  All other systems reviewed and are negative.    Physical Exam Updated Vital Signs BP 124/95 (BP Location: Left Arm)   Pulse 82   Temp 98.6 F (37 C) (Oral)   Resp 18   SpO2 97%   Physical Exam  Constitutional: He is oriented to person, place, and time. He appears well-developed and well-nourished.  HENT:  Head: Normocephalic and atraumatic.  Right Ear: External ear normal.  Left Ear: External ear normal.  Nose: Nose normal.  Eyes: Right eye exhibits no discharge. Left eye exhibits no discharge.  Neck: Neck supple.  Cardiovascular: Normal rate, regular rhythm and normal heart sounds.   Pulmonary/Chest: Effort normal and breath sounds normal. He exhibits tenderness.    Abdominal: Soft. There is tenderness in the right upper quadrant, epigastric area and left upper quadrant. There is negative Murphy's sign.  Musculoskeletal: He exhibits no edema.  Neurological: He is alert and oriented to person, place, and time.  Skin: Skin is warm and dry.  Nursing note and vitals reviewed.    ED Treatments / Results  Labs (all labs ordered are listed, but only abnormal results are displayed) Labs Reviewed  COMPREHENSIVE METABOLIC PANEL - Abnormal; Notable for the following:       Result Value   Sodium 133 (*)    Glucose, Bld 408 (*)    Calcium 8.7 (*)    Total Protein 6.0 (*)    Albumin 3.4 (*)    AST 13 (*)    ALT 14 (*)    Anion gap 4 (*)    All other components within normal limits  CBC WITH DIFFERENTIAL/PLATELET - Abnormal; Notable for the following:    Hemoglobin 12.2 (*)    HCT 37.4 (*)    All other components within normal limits  CBG MONITORING, ED - Abnormal; Notable for the following:    Glucose-Capillary 395 (*)    All other components within normal limits  LIPASE, BLOOD    I-STAT TROPOININ, ED    EKG  EKG Interpretation  Date/Time:  Monday May 26 2016 20:36:12 EDT Ventricular Rate:  80 PR Interval:    QRS Duration: 92 QT Interval:  373 QTC Calculation: 431 R Axis:   47 Text Interpretation:  Sinus rhythm Borderline T wave abnormalities Minimal ST elevation, anterior leads T wave changes laterally are not as prominent as May 09 2016 Confirmed by Regenia Skeeter MD, [place] [phone]) on 05/26/2016 8:39:51 PM       EKG Interpretation  Date/Time:  Monday May 26 2016 23:05:07 EDT Ventricular Rate:  64 PR Interval:  QRS Duration: 94 QT Interval:  406 QTC Calculation: 419 R Axis:   67 Text Interpretation:  Sinus rhythm Borderline T abnormalities, diffuse leads Minimal ST elevation, anterior leads no significant change since earlier in the day Confirmed by [place] MD, [place] [phone]) on 05/26/2016 11:29:00 PM       Radiology Dg Chest 2 View  Result Date: 05/26/2016 CLINICAL DATA:  Initial evaluation for acute upper abdominal on left chest pain. EXAM: CHEST  2 VIEW COMPARISON:  Prior CT and radiograph from 04/25/2016. FINDINGS: The cardiac and mediastinal silhouettes are stable in size and contour, and remain within normal limits. The lungs are normally inflated. No airspace consolidation, pleural effusion, or pulmonary edema is identified. There is no pneumothorax. No acute osseous abnormality identified. IMPRESSION: No active cardiopulmonary disease. Electronically Signed   By: Jeannine Boga M.D.   On: 05/26/2016 21:25   US Abdomen Limited Ruq  Result Date: 05/26/2016 CLINICAL DATA:  Chest pain with nausea and vomiting. EXAM: US ABDOMEN LIMITED - RIGHT UPPER QUADRANT COMPARISON:  None. FINDINGS: Gallbladder: No gallstones or wall thickening visualized. No sonographic Murphy sign noted by sonographer. Common bile duct: Diameter: 3 mm Liver: No focal lesion identified. Within normal limits in parenchymal echogenicity. IMPRESSION: No acute abnormalities.  Electronically Signed   By: Dorise Bullion III M.D   On: 05/26/2016 21:44    Procedures Procedures (including critical care time)  Medications Ordered in ED Medications  sodium chloride 0.9 % bolus 1,000 mL (not administered)  gi cocktail (Maalox,Lidocaine,Donnatal) (not administered)     Initial Impression / Assessment and Plan / ED Course  I have reviewed the triage vital signs and the nursing notes.  Pertinent labs & imaging results that were available during my care of the patient were reviewed by me and considered in my medical decision making (see chart for details).  Clinical Course  Comment By Time  Symptoms are probably GI in nature. Will give GI cocktail, check abdominal labs, troponin, chest x-ray, and right upper quadrant ultrasound. Sherwood Gambler, MD 09/04 2045  Patient feels much better after GI cocktail. U/s, CXR, labs unremarkable besides hyperglycemia. Will get 2nd troponin given age and risk factors, but with negative cath I think he will be safe for discharge.  Sherwood Gambler, MD 09/04 2301  2nd troponin negative. I think this is GI. Will treat with PPI, pepcid, f/u with PCP. Discussed return precautions. Sherwood Gambler, MD 09/05 [phone]    Final Clinical Impressions(s) / ED Diagnoses   Final diagnoses:  Nonspecific chest pain  Upper abdominal pain  Hyperglycemia    New Prescriptions New Prescriptions   No medications on file     Sherwood Gambler, MD 05/27/16 [phone]

## 2016-05-26 NOTE — ED Notes (Signed)
CBG is 395.

## 2016-05-26 NOTE — ED Notes (Signed)
Pt transported to Xray. 

## 2016-05-26 NOTE — ED Triage Notes (Signed)
Per EMS pt had walked about a mile then began to have a 2/10 chest pain, he took 2 nitro w/ no relief.  Pt ate a hotdog and had a sudden spike in pain 8/10.  He is not compliant w/ his insulin and HCTZ.  EMS gave 2 more nitro w/ no relief and 324 ASA.  He did have exploratory cath 2 weeks ago.

## 2016-05-27 LAB — I-STAT TROPONIN, ED: Troponin i, poc: 0 ng/mL (ref 0.00–0.08)

## 2016-05-27 MED ORDER — PANTOPRAZOLE SODIUM 40 MG PO TBEC: 40.0000 mg | DELAYED_RELEASE_TABLET | Freq: Every day | ORAL | 0 refills | Status: DC

## 2016-05-27 MED ORDER — FAMOTIDINE 20 MG PO TABS: 20.0000 mg | ORAL_TABLET | Freq: Two times a day (BID) | ORAL | 0 refills | Status: DC

## 2016-05-27 NOTE — ED Notes (Signed)
I bus pass given upon discharge

## 2016-06-03 ENCOUNTER — Encounter: Payer: Self-pay | Admitting: Pediatric Intensive Care

## 2016-06-03 DIAGNOSIS — E119 Type 2 diabetes mellitus without complications: Secondary | ICD-10-CM

## 2016-06-09 NOTE — Congregational Nurse Program (Signed)
Congregational Nurse Program Note  Date of Encounter: 06/03/2016  Past Medical History: Past Medical History:  Diagnosis Date  . Back pain, chronic   . HLD (hyperlipidemia)   . Hypertension   . IDDM (insulin dependent diabetes mellitus) ([place])   . Ruptured lumbar disc     Encounter Details:     CNP Questionnaire - 06/09/16 1133      Patient Demographics   Is this a new or existing patient? Existing   Patient is considered a/an Not Applicable   Race African-American/Black     Patient Assistance   Location of Patient Assistance [place]   Patient's financial/insurance status Low Income;Self-Pay   Uninsured Patient Yes   Interventions Not Applicable   Patient referred to apply for the following financial assistance Not Applicable   Food insecurities addressed Not Applicable   Transportation assistance No   Assistance securing medications Yes   Type of [place] Department   Educational health offerings Diabetes;Navigating the healthcare system     Encounter Details   Primary purpose of visit [place]   Was an Emergency Department visit averted? Not Applicable   Does patient have a medical provider? Yes   Patient referred to Not Applicable   Was a mental health screening completed? (GAINS tool) No   Does patient have dental issues? No   Does patient have vision issues? No   Does your patient have an abnormal blood pressure today? No   Since previous encounter, have you referred patient for abnormal blood pressure that resulted in a new diagnosis or medication change? No   Does your patient have an abnormal blood glucose today? No   Since previous encounter, have you referred patient for abnormal blood glucose that resulted in a new diagnosis or medication change? No   Was there a life-saving intervention made? No     Client states he has not been able to pick up test strips at [facility]. His medication has been available. CN will  contact [facility] clinic.

## 2016-06-25 NOTE — Addendum Note (Signed)
Addended by: Zebedee Iba on: 06/25/2016 09:58 AM   Modules accepted: Orders

## 2016-07-12 ENCOUNTER — Emergency Department (HOSPITAL_COMMUNITY)
Admission: EM | Admit: 2016-07-12 | Discharge: 2016-07-12 | Disposition: A | Discharge status: Home / Self Care | Payer: Medicaid Other | Attending: Emergency Medicine | Admitting: Emergency Medicine

## 2016-07-12 ENCOUNTER — Encounter (HOSPITAL_COMMUNITY): Payer: Self-pay | Admitting: Emergency Medicine

## 2016-07-12 DIAGNOSIS — Z7982 Long term (current) use of aspirin: Secondary | ICD-10-CM | POA: Diagnosis not present

## 2016-07-12 DIAGNOSIS — E1165 Type 2 diabetes mellitus with hyperglycemia: Secondary | ICD-10-CM | POA: Insufficient documentation

## 2016-07-12 DIAGNOSIS — Z7984 Long term (current) use of oral hypoglycemic drugs: Secondary | ICD-10-CM | POA: Insufficient documentation

## 2016-07-12 DIAGNOSIS — I10 Essential (primary) hypertension: Secondary | ICD-10-CM | POA: Diagnosis not present

## 2016-07-12 DIAGNOSIS — Z79899 Other long term (current) drug therapy: Secondary | ICD-10-CM | POA: Insufficient documentation

## 2016-07-12 DIAGNOSIS — R42 Dizziness and giddiness: Secondary | ICD-10-CM | POA: Diagnosis present

## 2016-07-12 DIAGNOSIS — Z794 Long term (current) use of insulin: Secondary | ICD-10-CM | POA: Insufficient documentation

## 2016-07-12 DIAGNOSIS — R739 Hyperglycemia, unspecified: Secondary | ICD-10-CM

## 2016-07-12 DIAGNOSIS — F17210 Nicotine dependence, cigarettes, uncomplicated: Secondary | ICD-10-CM | POA: Insufficient documentation

## 2016-07-12 HISTORY — DX: Type 2 diabetes mellitus without complications: E11.9

## 2016-07-12 LAB — URINALYSIS, ROUTINE W REFLEX MICROSCOPIC
Bilirubin Urine: NEGATIVE
Hgb urine dipstick: NEGATIVE
Ketones, ur: NEGATIVE mg/dL
LEUKOCYTES UA: NEGATIVE
Nitrite: NEGATIVE
PH: 5.5 (ref 5.0–8.0)
PROTEIN: NEGATIVE mg/dL
SPECIFIC GRAVITY, URINE: 1.03 (ref 1.005–1.030)

## 2016-07-12 LAB — CBC
HEMATOCRIT: 41.6 % (ref 39.0–52.0)
HEMOGLOBIN: 14 g/dL (ref 13.0–17.0)
MCH: 28.9 pg (ref 26.0–34.0)
MCHC: 33.7 g/dL (ref 30.0–36.0)
MCV: 85.8 fL (ref 78.0–100.0)
Platelets: 253 10*3/uL (ref 150–400)
RBC: 4.85 MIL/uL (ref 4.22–5.81)
RDW: 12.7 % (ref 11.5–15.5)
WBC: 5 10*3/uL (ref 4.0–10.5)

## 2016-07-12 LAB — I-STAT VENOUS BLOOD GAS, ED
ACID-BASE EXCESS: 2 mmol/L (ref 0.0–2.0)
BICARBONATE: 28.7 mmol/L — AB (ref 20.0–28.0)
O2 Saturation: 20 %
PH VEN: 7.34 (ref 7.250–7.430)
TCO2: 30 mmol/L (ref 0–100)
pCO2, Ven: 53.1 mmHg (ref 44.0–60.0)
pO2, Ven: 16 mmHg — CL (ref 32.0–45.0)

## 2016-07-12 LAB — BASIC METABOLIC PANEL
ANION GAP: 8 (ref 5–15)
BUN: 13 mg/dL (ref 6–20)
CHLORIDE: 98 mmol/L — AB (ref 101–111)
CO2: 27 mmol/L (ref 22–32)
Calcium: 9.3 mg/dL (ref 8.9–10.3)
Creatinine, Ser: 1.1 mg/dL (ref 0.61–1.24)
GFR calc non Af Amer: >60 mL/min (ref >60)
Glucose, Bld: 544 mg/dL (ref 65–99)
POTASSIUM: 4.6 mmol/L (ref 3.5–5.1)
SODIUM: 133 mmol/L — AB (ref 135–145)

## 2016-07-12 LAB — URINE MICROSCOPIC-ADD ON

## 2016-07-12 LAB — CBG MONITORING, ED
Glucose-Capillary: 522 mg/dL (ref 65–99)
Glucose-Capillary: 314 mg/dL — ABNORMAL HIGH (ref 65–99)

## 2016-07-12 MED ORDER — SODIUM CHLORIDE 0.9 % IV BOLUS (SEPSIS)
1000.0000 mL | Freq: Once | INTRAVENOUS | Status: AC
  Administered 2016-07-12: 1000 mL via INTRAVENOUS

## 2016-07-12 MED ORDER — INSULIN ASPART 100 UNIT/ML ~~LOC~~ SOLN: 6.0000 [IU] | Freq: Once | SUBCUTANEOUS | Status: DC

## 2016-07-12 MED ORDER — INSULIN DETEMIR 100 UNIT/ML ~~LOC~~ SOLN
20.0000 [IU] | Freq: Once | SUBCUTANEOUS | Status: AC
  Administered 2016-07-12: 20 [IU] via SUBCUTANEOUS
  Filled 2016-07-12: qty 0.2

## 2016-07-12 NOTE — Discharge Instructions (Signed)
Keep a journal to record her daily blood sugar readings. Make sure that you take all your medications without skipping any doses. Avoid meals with a large amount of carbohydrates such as pasta, pizza, and bread. Also avoid sugary drinks.

## 2016-07-12 NOTE — ED Triage Notes (Signed)
Pt. Stated, I've been weak and dizzy for over a year since I found out I was diabetic

## 2016-07-12 NOTE — ED Provider Notes (Signed)
[facility] DEPT Provider Note   CSN: [id number] Arrival date & time: 07/12/16  1020     History   Chief Complaint Chief Complaint  Patient presents with  . Weakness  . Dizziness    HPI Ryan Medina is a 55 y.o. male.  55yo M w/ PMH including IDDM, HLD, HTN who p/w weakness and dizziness. The patient states that he has had 1 year of generalized weakness and dizziness ever since his diagnosis of diabetes. He also endorses polyuria, polydipsia, and dry mouth. He states that he has been taking his diabetes medications as prescribed although he has not had his dose of levimir or novolog this morning. He denies any pain, vomiting, fevers, or recent illness. He saw his PCP recently for a refill of medications, next visit is next month. He denies any significant changes in weight or diet.    Weakness  Primary symptoms include dizziness.  Dizziness  Associated symptoms: weakness     Past Medical History:  Diagnosis Date  . Back pain, chronic   . Diabetes ([place])   . HLD (hyperlipidemia)   . Hypertension   . IDDM (insulin dependent diabetes mellitus) ([place])   . Ruptured lumbar disc     Patient Active Problem List   Diagnosis Date Noted  . Abnormal nuclear stress test 05/19/2016  . Atypical angina ([place]) 04/25/2016  . DM (diabetes mellitus), type 2, uncontrolled ([place]) 03/14/2015  . Essential hypertension 03/14/2015  . Hyperglycemia 01/14/2015    Past Surgical History:  Procedure Laterality Date  . ABDOMINAL SURGERY    . APPENDECTOMY    . BACK SURGERY    . CARDIAC CATHETERIZATION N/A 05/19/2016   Procedure: Left Heart Cath and Coronary Angiography;  Surgeon: Leonie Man, MD;  Location: [facility];  Service: Cardiovascular;  Laterality: N/A;       Home Medications    Prior to Admission medications   Medication Sig Start Date End Date Taking? Authorizing Provider  amLODipine (NORVASC) 10 MG tablet Take 10 mg by mouth daily.   Yes Historical Provider, MD   aspirin 81 MG chewable tablet Chew 1 tablet (81 mg total) by mouth daily. 04/25/16  Yes Duffy Bruce, MD  famotidine (PEPCID) 20 MG tablet Take 1 tablet (20 mg total) by mouth 2 (two) times daily. 05/27/16  Yes Sherwood Gambler, MD  gabapentin (NEURONTIN) 300 MG capsule Take 1 capsule (300 mg total) by mouth at bedtime. 03/22/16  Yes Isla Pence, MD  hydrochlorothiazide (HYDRODIURIL) 25 MG tablet Take 1 tablet (25 mg total) by mouth daily. 03/22/16  Yes Isla Pence, MD  insulin aspart (NOVOLOG) 100 UNIT/ML injection Inject 12 Units into the skin 2 (two) times daily at 8 am and 10 pm.   Yes Historical Provider, MD  insulin detemir (LEVEMIR) 100 UNIT/ML injection Inject 20 Units into the skin 2 (two) times daily.    Yes Historical Provider, MD  meloxicam (MOBIC) 15 MG tablet Take 15 mg by mouth daily.   Yes Historical Provider, MD  metFORMIN (GLUCOPHAGE) 1000 MG tablet Take 1 tablet (1,000 mg total) by mouth 2 (two) times daily with a meal. 03/22/16  Yes Isla Pence, MD  pantoprazole (PROTONIX) 40 MG tablet Take 1 tablet (40 mg total) by mouth daily. 05/27/16  Yes Sherwood Gambler, MD  cyclobenzaprine (FLEXERIL) 10 MG tablet Take 10 mg by mouth daily as needed for muscle spasms.     Historical Provider, MD  nitroGLYCERIN (NITROSTAT) 0.4 MG SL tablet Place 0.4 mg under the  tongue every 5 (five) minutes as needed for chest pain (x 3 doses).    Historical Provider, MD    Family History Family History  Problem Relation Age of Onset  . Diabetes Mother   . Heart failure Mother   . Diabetes Sister   . Diabetes Brother     Social History Social History  Substance Use Topics  . Smoking status: Current Some Day Smoker    Packs/day: 0.30    Years: 25.00    Types: Cigarettes  . Smokeless tobacco: Never Used  . Alcohol use No     Allergies   Lisinopril   Review of Systems Review of Systems  Neurological: Positive for dizziness and weakness.   10 Systems reviewed and are negative for acute  change except as noted in the HPI.   Physical Exam Updated Vital Signs BP 121/88   Pulse 60   Temp 98.7 F (37.1 C) (Oral)   Resp 16   Wt 202 lb (91.6 kg)   SpO2 97%   BMI 28.17 kg/m   Physical Exam  Constitutional: He is oriented to person, place, and time. He appears well-developed and well-nourished. No distress.  HENT:  Head: Normocephalic and atraumatic.  Mildly dry mucous membranes  Eyes: Conjunctivae are normal.  Neck: Neck supple.  Cardiovascular: Normal rate, regular rhythm and normal heart sounds.   No murmur heard. Pulmonary/Chest: Effort normal and breath sounds normal.  Abdominal: Soft. Bowel sounds are normal. He exhibits no distension. There is no tenderness.  Musculoskeletal: He exhibits no edema.  Neurological: He is alert and oriented to person, place, and time.  Fluent speech  Skin: Skin is warm and dry.  Psychiatric: He has a normal mood and affect. Judgment normal.  Nursing note and vitals reviewed.    ED Treatments / Results  Labs (all labs ordered are listed, but only abnormal results are displayed) Labs Reviewed  BASIC METABOLIC PANEL - Abnormal; Notable for the following:       Result Value   Sodium 133 (*)    Chloride 98 (*)    Glucose, Bld 544 (*)    All other components within normal limits  URINALYSIS, ROUTINE W REFLEX MICROSCOPIC (NOT AT [facility]) - Abnormal; Notable for the following:    Color, Urine AMBER (*)    Glucose, UA >1000 (*)    All other components within normal limits  URINE MICROSCOPIC-ADD ON - Abnormal; Notable for the following:    Squamous Epithelial / LPF 0-5 (*)    Bacteria, UA RARE (*)    All other components within normal limits  CBG MONITORING, ED - Abnormal; Notable for the following:    Glucose-Capillary 522 (*)    All other components within normal limits  I-STAT VENOUS BLOOD GAS, ED - Abnormal; Notable for the following:    pO2, Ven 16.0 (*)    Bicarbonate 28.7 (*)    All other components within normal  limits  CBG MONITORING, ED - Abnormal; Notable for the following:    Glucose-Capillary 314 (*)    All other components within normal limits  CBC  BLOOD GAS, VENOUS    EKG  EKG Interpretation  Date/Time:  Saturday July 12 2016 10:24:47 EDT Ventricular Rate:  74 PR Interval:  156 QRS Duration: 92 QT Interval:  372 QTC Calculation: 412 R Axis:   41 Text Interpretation:  Normal sinus rhythm T wave abnormality, consider inferior ischemia Abnormal ECG No significant change since last tracing Confirmed by Kellianne Ek MD, Chamya Hunton [phone])  on 07/12/2016 10:35:48 AM Also confirmed by Pattye Meda MD, [place] [phone]), editor Yehuda Mao [phone])  on 07/12/2016 10:51:35 AM       Radiology No results found.  Procedures Procedures (including critical care time)  Medications Ordered in ED Medications  insulin aspart (novoLOG) injection 6 Units (6 Units Subcutaneous Not Given 07/12/16 1200)  sodium chloride 0.9 % bolus 1,000 mL (0 mLs Intravenous Stopped 07/12/16 1415)  sodium chloride 0.9 % bolus 1,000 mL (0 mLs Intravenous Stopped 07/12/16 1415)  insulin detemir (LEVEMIR) injection 20 Units (20 Units Subcutaneous Given 07/12/16 1415)     Initial Impression / Assessment and Plan / ED Course  I have reviewed the triage vital signs and the nursing notes.  Pertinent labs that were available during my care of the patient were reviewed by me and considered in my medical decision making (see chart for details).  Clinical Course    Pt w/ IDDM, states compliant w/ meds except today, p/w 1 year of ongoing weakness and dizziness As well as polyuria and polydipsia. He was awake and alert, comfortable on exam with normal vital signs. Initial blood glucose was 522. Nonfocal exam. BMP shows glucose 544, normal anion gap, normal CBC. Gave the patient 2 L of IV fluids as well as levemir. Pt's BG improved to 314 prior to administration of levemir.  I discussed the patient's diet with him and he stated that  he does eat a lot of pasta and drinks sweet tea. I counseled the patient on diabetic diet and emphasized importance of medication compliance. Instructed to follow-up with PCP as soon as possible to discuss medication changes. Patient voiced understanding of plan and return precautions and was discharged in satisfactory condition.  Final Clinical Impressions(s) / ED Diagnoses   Final diagnoses:  Hyperglycemia    New Prescriptions New Prescriptions   No medications on file     Sharlett Iles, MD 07/12/16 1529

## 2016-08-17 ENCOUNTER — Emergency Department (HOSPITAL_COMMUNITY)
Admission: EM | Admit: 2016-08-17 | Discharge: 2016-08-17 | Disposition: A | Discharge status: Home / Self Care | Payer: Medicaid Other | Attending: Emergency Medicine | Admitting: Emergency Medicine

## 2016-08-17 ENCOUNTER — Encounter (HOSPITAL_COMMUNITY): Payer: Self-pay

## 2016-08-17 DIAGNOSIS — F17210 Nicotine dependence, cigarettes, uncomplicated: Secondary | ICD-10-CM | POA: Diagnosis not present

## 2016-08-17 DIAGNOSIS — E0800 Diabetes mellitus due to underlying condition with hyperosmolarity without nonketotic hyperglycemic-hyperosmolar coma (NKHHC): Secondary | ICD-10-CM

## 2016-08-17 DIAGNOSIS — I10 Essential (primary) hypertension: Secondary | ICD-10-CM | POA: Diagnosis not present

## 2016-08-17 DIAGNOSIS — R300 Dysuria: Secondary | ICD-10-CM | POA: Diagnosis present

## 2016-08-17 DIAGNOSIS — Z7982 Long term (current) use of aspirin: Secondary | ICD-10-CM | POA: Diagnosis not present

## 2016-08-17 DIAGNOSIS — Z79899 Other long term (current) drug therapy: Secondary | ICD-10-CM | POA: Diagnosis not present

## 2016-08-17 DIAGNOSIS — R739 Hyperglycemia, unspecified: Secondary | ICD-10-CM

## 2016-08-17 DIAGNOSIS — R1012 Left upper quadrant pain: Secondary | ICD-10-CM | POA: Insufficient documentation

## 2016-08-17 DIAGNOSIS — Z794 Long term (current) use of insulin: Secondary | ICD-10-CM | POA: Diagnosis not present

## 2016-08-17 LAB — URINE MICROSCOPIC-ADD ON
Bacteria, UA: NONE SEEN
RBC / HPF: NONE SEEN RBC/hpf (ref 0–5)

## 2016-08-17 LAB — COMPREHENSIVE METABOLIC PANEL
ALT: 14 U/L — AB (ref 17–63)
AST: 13 U/L — AB (ref 15–41)
Albumin: 3.6 g/dL (ref 3.5–5.0)
Alkaline Phosphatase: 77 U/L (ref 38–126)
Anion gap: 11 (ref 5–15)
BILIRUBIN TOTAL: 0.6 mg/dL (ref 0.3–1.2)
BUN: 16 mg/dL (ref 6–20)
CHLORIDE: 91 mmol/L — AB (ref 101–111)
CO2: 23 mmol/L (ref 22–32)
CREATININE: 1.13 mg/dL (ref 0.61–1.24)
Calcium: 8.9 mg/dL (ref 8.9–10.3)
Glucose, Bld: 486 mg/dL — ABNORMAL HIGH (ref 65–99)
Potassium: 4.1 mmol/L (ref 3.5–5.1)
Sodium: 125 mmol/L — ABNORMAL LOW (ref 135–145)
TOTAL PROTEIN: 6.4 g/dL — AB (ref 6.5–8.1)

## 2016-08-17 LAB — CBC WITH DIFFERENTIAL/PLATELET
Basophils Absolute: 0 10*3/uL (ref 0.0–0.1)
Basophils Relative: 0 %
EOS ABS: 0.1 10*3/uL (ref 0.0–0.7)
EOS PCT: 2 %
HCT: 42 % (ref 39.0–52.0)
HEMOGLOBIN: 14.8 g/dL (ref 13.0–17.0)
LYMPHS ABS: 2.1 10*3/uL (ref 0.7–4.0)
LYMPHS PCT: 37 %
MCH: 29.5 pg (ref 26.0–34.0)
MCHC: 35.2 g/dL (ref 30.0–36.0)
MCV: 83.8 fL (ref 78.0–100.0)
MONOS PCT: 4 %
Monocytes Absolute: 0.2 10*3/uL (ref 0.1–1.0)
Neutro Abs: 3.3 10*3/uL (ref 1.7–7.7)
Neutrophils Relative %: 57 %
PLATELETS: 239 10*3/uL (ref 150–400)
RBC: 5.01 MIL/uL (ref 4.22–5.81)
RDW: 12.5 % (ref 11.5–15.5)
WBC: 5.8 10*3/uL (ref 4.0–10.5)

## 2016-08-17 LAB — URINALYSIS, ROUTINE W REFLEX MICROSCOPIC
BILIRUBIN URINE: NEGATIVE
HGB URINE DIPSTICK: NEGATIVE
Ketones, ur: NEGATIVE mg/dL
Leukocytes, UA: NEGATIVE
Nitrite: NEGATIVE
PH: 5.5 (ref 5.0–8.0)
Protein, ur: NEGATIVE mg/dL
SPECIFIC GRAVITY, URINE: 1.036 — AB (ref 1.005–1.030)

## 2016-08-17 LAB — CBG MONITORING, ED
GLUCOSE-CAPILLARY: 507 mg/dL — AB (ref 65–99)
GLUCOSE-CAPILLARY: 317 mg/dL — AB (ref 65–99)
Glucose-Capillary: 246 mg/dL — ABNORMAL HIGH (ref 65–99)

## 2016-08-17 MED ORDER — INSULIN ASPART 100 UNIT/ML ~~LOC~~ SOLN
5.0000 [IU] | Freq: Once | SUBCUTANEOUS | Status: AC
  Administered 2016-08-17: 5 [IU] via SUBCUTANEOUS
  Filled 2016-08-17: qty 1

## 2016-08-17 MED ORDER — SODIUM CHLORIDE 0.9 % IV BOLUS (SEPSIS)
2000.0000 mL | Freq: Once | INTRAVENOUS | Status: AC
  Administered 2016-08-17: 2000 mL via INTRAVENOUS

## 2016-08-17 MED ORDER — INSULIN ASPART 100 UNIT/ML ~~LOC~~ SOLN
10.0000 [IU] | Freq: Once | SUBCUTANEOUS | Status: AC
  Administered 2016-08-17: 10 [IU] via SUBCUTANEOUS
  Filled 2016-08-17: qty 1

## 2016-08-17 NOTE — ED Triage Notes (Signed)
Patient here with left flank pain and dysuria for 3-4 days. States that this am has been unable to urinate but a few drops. Alert and oriented, NAD. Unable to provide urine at triage

## 2016-08-17 NOTE — ED Notes (Signed)
CBG: 246. RN notified.

## 2016-08-17 NOTE — Discharge Instructions (Signed)
Please avoid food that has high sugar content.  Eliminate soda from your diet.  Follow up with your doctor for further management of your diabetes.

## 2016-08-17 NOTE — ED Provider Notes (Signed)
[facility] DEPT Provider Note   CSN: [id number] Arrival date & time: 08/17/16  1046     History   Chief Complaint Chief Complaint  Patient presents with  . dysuria/flank pain    HPI Ryan Medina is a 55 y.o. male.  HPI   55 year old male with history of insulin-dependent diabetes, hypertension, hyperlipidemia, chronic back pain presenting with complaints of dysuria. Patient mention he has history of diabetes and his blood sugar been running high for more than a year despite taking his metformin and insulin as recommended. For the past month the report having increased polyuria, polydipsia, and generalized fatigue. He mentioned as soon as he drinks fluid, he has to urinate. Also complaining of left flank pain which he described as an achy sensation usually after drinking fluid the past month. He denies any fever, lightheadedness, dizziness, chest pain, shortness of breath, burning urination, penile discharge, hematuria, or rash. No history of kidney stone. He admits that he does not monitor his diet appropriately and drink a lot of sodas. He was told that he has acute kidney injury in the past.   Past Medical History:  Diagnosis Date  . Back pain, chronic   . Diabetes ([place])   . HLD (hyperlipidemia)   . Hypertension   . IDDM (insulin dependent diabetes mellitus) ([place])   . Ruptured lumbar disc     Patient Active Problem List   Diagnosis Date Noted  . Abnormal nuclear stress test 05/19/2016  . Atypical angina ([place]) 04/25/2016  . DM (diabetes mellitus), type 2, uncontrolled ([place]) 03/14/2015  . Essential hypertension 03/14/2015  . Hyperglycemia 01/14/2015    Past Surgical History:  Procedure Laterality Date  . ABDOMINAL SURGERY    . APPENDECTOMY    . BACK SURGERY    . CARDIAC CATHETERIZATION N/A 05/19/2016   Procedure: Left Heart Cath and Coronary Angiography;  Surgeon: Leonie Man, MD;  Location: [facility];  Service: Cardiovascular;  Laterality: N/A;        Home Medications    Prior to Admission medications   Medication Sig Start Date End Date Taking? Authorizing Provider  amLODipine (NORVASC) 10 MG tablet Take 10 mg by mouth daily.    Historical Provider, MD  aspirin 81 MG chewable tablet Chew 1 tablet (81 mg total) by mouth daily. 04/25/16   Duffy Bruce, MD  cyclobenzaprine (FLEXERIL) 10 MG tablet Take 10 mg by mouth daily as needed for muscle spasms.     Historical Provider, MD  famotidine (PEPCID) 20 MG tablet Take 1 tablet (20 mg total) by mouth 2 (two) times daily. 05/27/16   Sherwood Gambler, MD  gabapentin (NEURONTIN) 300 MG capsule Take 1 capsule (300 mg total) by mouth at bedtime. 03/22/16   Isla Pence, MD  hydrochlorothiazide (HYDRODIURIL) 25 MG tablet Take 1 tablet (25 mg total) by mouth daily. 03/22/16   Isla Pence, MD  insulin aspart (NOVOLOG) 100 UNIT/ML injection Inject 12 Units into the skin 2 (two) times daily at 8 am and 10 pm.    Historical Provider, MD  insulin detemir (LEVEMIR) 100 UNIT/ML injection Inject 20 Units into the skin 2 (two) times daily.     Historical Provider, MD  meloxicam (MOBIC) 15 MG tablet Take 15 mg by mouth daily.    Historical Provider, MD  metFORMIN (GLUCOPHAGE) 1000 MG tablet Take 1 tablet (1,000 mg total) by mouth 2 (two) times daily with a meal. 03/22/16   Isla Pence, MD  nitroGLYCERIN (NITROSTAT) 0.4 MG SL tablet Place  0.4 mg under the tongue every 5 (five) minutes as needed for chest pain (x 3 doses).    Historical Provider, MD  pantoprazole (PROTONIX) 40 MG tablet Take 1 tablet (40 mg total) by mouth daily. 05/27/16   Sherwood Gambler, MD    Family History Family History  Problem Relation Age of Onset  . Diabetes Mother   . Heart failure Mother   . Diabetes Sister   . Diabetes Brother     Social History Social History  Substance Use Topics  . Smoking status: Current Some Day Smoker    Packs/day: 0.30    Years: 25.00    Types: Cigarettes  . Smokeless tobacco: Never Used  .  Alcohol use No     Allergies   Lisinopril   Review of Systems Review of Systems  All other systems reviewed and are negative.    Physical Exam Updated Vital Signs BP (!) 140/101 (BP Location: Left Arm)   Pulse 94   Temp 98 F (36.7 C) (Oral)   Resp 14   SpO2 100%   Physical Exam  Constitutional: He appears well-developed and well-nourished. No distress.  HENT:  Head: Atraumatic.  Eyes: Conjunctivae are normal.  Neck: Neck supple.  Cardiovascular: Normal rate and regular rhythm.   Pulmonary/Chest: Effort normal and breath sounds normal.  Abdominal: Soft. He exhibits no distension. There is tenderness (Mild left upper quadrant tenderness without guarding or rebound tenderness).  Genitourinary:  Genitourinary Comments: Left CVA tenderness on percussion.  Neurological: He is alert.  Skin: No rash noted.  Psychiatric: He has a normal mood and affect.  Nursing note and vitals reviewed.    ED Treatments / Results  Labs (all labs ordered are listed, but only abnormal results are displayed) Labs Reviewed  URINALYSIS, ROUTINE W REFLEX MICROSCOPIC (NOT AT [facility])    EKG  EKG Interpretation None       Radiology No results found.  Procedures Procedures (including critical care time)  Medications Ordered in ED Medications - No data to display   Initial Impression / Assessment and Plan / ED Course  I have reviewed the triage vital signs and the nursing notes.  Pertinent labs & imaging results that were available during my care of the patient were reviewed by me and considered in my medical decision making (see chart for details).  Clinical Course     BP 117/89   Pulse 70   Temp 98 F (36.7 C) (Oral)   Resp 18   SpO2 100%    Final Clinical Impressions(s) / ED Diagnoses   Final diagnoses:  Hyperglycemia  Diabetes mellitus due to underlying condition, uncontrolled, with hyperosmolarity without coma, with long-term current use of insulin (HCC)    New  Prescriptions New Prescriptions   No medications on file   11:43 AM Patient here with generalized fatigue, polyuria and polydipsia. Suspect symptoms likely due to uncontrolled diabetes. He denies burning urination however I will obtain a UA to rule out UTI. Does complain of left flank pain but symptoms does not suggest kidney stones.  Work up initiated.  Elevated CBG consistent with uncontrolled diabetes.  Pt received IVF and insulin and CBG improves.  Encourage close f/u with PCP for further management of his health.  Recommend healthy eating, avoid soda or food with sugary contents.  Return precaution given.    Domenic Moras, PA-C 08/18/16 [place], MD 08/19/16 0030

## 2016-08-17 NOTE — ED Notes (Signed)
Pt given sandwich and diet ginger ale at RN's request.

## 2016-08-20 ENCOUNTER — Telehealth: Payer: Self-pay

## 2016-08-20 NOTE — Congregational Nurse Program (Signed)
Congregational Nurse Program Note  Date of Encounter: 08/19/2016  Past Medical History: Past Medical History:  Diagnosis Date  . Back pain, chronic   . Diabetes ([place])   . HLD (hyperlipidemia)   . Hypertension   . IDDM (insulin dependent diabetes mellitus) ([place])   . Ruptured lumbar disc     Encounter Details:     CNP Questionnaire - 08/20/16 1724      Patient Demographics   Is this a new or existing patient? New   Patient is considered a/an Not Applicable   Race African-American/Black     Patient Assistance   Location of Patient Assistance Not Applicable   Patient's financial/insurance status Medicaid   Uninsured Patient ([place] [employer] Connects) No   Patient referred to apply for the following financial assistance Not Applicable   Food insecurities addressed Not Applicable   Transportation assistance No   Assistance securing medications Yes   Type of Restaurant manager, fast food health offerings Medications;Diabetes;Hypertension;Navigating the healthcare system     Encounter Details   Primary purpose of visit Chronic Illness/Condition Visit;Education/Health Concerns;Navigating the Healthcare System;Post ED/Hospitalization Visit;Spiritual Care/Support Visit   Was an Emergency Department visit averted? Yes   Does patient have a medical provider? Yes   Patient referred to Follow up with established PCP   Was a mental health screening completed? (GAINS tool) No   Does patient have dental issues? No   Does patient have vision issues? No   Does your patient have an abnormal blood pressure today? Yes   Since previous encounter, have you referred patient for abnormal blood pressure that resulted in a new diagnosis or medication change? No   Does your patient have an abnormal blood glucose today? Yes   Since previous encounter, have you referred patient for abnormal blood glucose that resulted in a new diagnosis or medication change? No   Was there a life-saving  intervention made? No    This was my visit with client. In today needing strips for his blood glucose machine. Nurse talked with pharmacy  And will need an order to  Get him the  Strips for his  Machine. Client has an appointment with a new provider on 12-8 but  Needs to  Be checking his blood sugars as he was hospitalized 08-07-16  For  Blood sugars over 600 mg he states. Client now  [facility] and cant return to  [facility] .  Nurse called his  Previous  Provider Np at [facility] and will will do the order for strips and insulin until he gets to his new PCP next week but to assure he doesn't run out and takes his medications we will et him the needed supplies. Ref[facility] contact with  [facility]. Client is to  Check in with  Nurse weekly  To  ,monitor his BP and diabetes. He states he took his blood  Sugar this  Pm and it was  266 mg. Nurse counseled  Regarding the highness and the need to  Watch his intake of certain foods ,states that is  Difficult  With the choices he has available. Diabetic for 2 years , had once strip left , smoker . B?P today 130/96 , pulse 81  .

## 2016-08-20 NOTE — Telephone Encounter (Signed)
Pharmacy referral sent to  Obtain needed diabetes supplies  And insulin until  Client has his  Appointment with his new PCP. Pharmacy will deliever medication and supplies tomorrow.

## 2016-08-26 NOTE — Congregational Nurse Program (Signed)
Congregational Nurse Program Note  Date of Encounter: 08/26/2016  Past Medical History: Past Medical History:  Diagnosis Date  . Back pain, chronic   . Diabetes ([place])   . HLD (hyperlipidemia)   . Hypertension   . IDDM (insulin dependent diabetes mellitus) ([place])   . Ruptured lumbar disc     Encounter Details:     CNP Questionnaire - 08/26/16 1745      Patient Demographics   Is this a new or existing patient? Existing   Patient is considered a/an Not Applicable   Race African-American/Black     Patient Assistance   Location of Patient Assistance Not Applicable   Patient's financial/insurance status Medicaid   Uninsured Patient ([place] [employer]/Care Connects) No   Patient referred to apply for the following financial assistance Not Applicable   Food insecurities addressed Not Applicable   Transportation assistance No   Assistance securing medications Yes   Type of Restaurant manager, fast food health offerings Diabetes;Chronic disease;Hypertension;Medications;Navigating the healthcare system;Nutrition     Encounter Details   Primary purpose of visit Chronic Illness/Condition Visit;Education/Health Concerns;Navigating the Healthcare System;Spiritual Care/Support Visit;Post PCP Visit   Was an Emergency Department visit averted? Yes   Does patient have a medical provider? Yes   Patient referred to Follow up with established PCP   Was a mental health screening completed? (GAINS tool) No   Does patient have dental issues? No   Does patient have vision issues? No   Does your patient have an abnormal blood pressure today? No   Since previous encounter, have you referred patient for abnormal blood pressure that resulted in a new diagnosis or medication change? No   Does your patient have an abnormal blood glucose today? Yes   Since previous encounter, have you referred patient for abnormal blood glucose that resulted in a new diagnosis or medication change? No   Was  there a life-saving intervention made? No    Client  Received  His  Diabetes  Supplies  From  Pharmacy  But  His  Insulin was placed in the  Refrigerator . Nurse located  Medications  For  Client and he has  Enough  For  Tonight and will start on other  med's tomorrow. Nurse counseled  Regarding his  Up  And  Down blood  Sugars  . Having  Difficulty  managing his  Levels . Will see his  New  MD  Friday  Encouraged him to  Let his  MD  Know  About  His  Blood  Sugars ranging from 300's  To  142 mg. Encouraged to  Add some  Exercise to daily  Routine . Having  Difficulty making good  Nutritional choices with  Meals  That are prepared in cafeteria. Monitor weekly ,encouraged to  Stop by weekly.

## 2016-08-29 ENCOUNTER — Ambulatory Visit (HOSPITAL_COMMUNITY)
Admission: RE | Admit: 2016-08-29 | Discharge: 2016-08-29 | Disposition: A | Discharge status: Home / Self Care | Payer: Medicaid Other | Source: Ambulatory Visit | Attending: Family Medicine | Admitting: Family Medicine

## 2016-08-29 ENCOUNTER — Ambulatory Visit (INDEPENDENT_AMBULATORY_CARE_PROVIDER_SITE_OTHER): Payer: Medicaid Other | Admitting: Family Medicine

## 2016-08-29 ENCOUNTER — Encounter: Payer: Self-pay | Admitting: Family Medicine

## 2016-08-29 VITALS — BP 133/89 | HR 92 | Temp 98.1°F | Resp 18 | Ht 71.0 in | Wt 213.0 lb

## 2016-08-29 DIAGNOSIS — Z23 Encounter for immunization: Secondary | ICD-10-CM

## 2016-08-29 DIAGNOSIS — E1100 Type 2 diabetes mellitus with hyperosmolarity without nonketotic hyperglycemic-hyperosmolar coma (NKHHC): Secondary | ICD-10-CM

## 2016-08-29 DIAGNOSIS — I10 Essential (primary) hypertension: Secondary | ICD-10-CM

## 2016-08-29 LAB — POCT URINALYSIS DIP (DEVICE)
BILIRUBIN URINE: NEGATIVE
GLUCOSE, UA: 500 mg/dL — AB
Hgb urine dipstick: NEGATIVE
KETONES UR: NEGATIVE mg/dL
LEUKOCYTES UA: NEGATIVE
Nitrite: NEGATIVE
PH: 5 (ref 5.0–8.0)
Protein, ur: NEGATIVE mg/dL
Specific Gravity, Urine: 1.01 (ref 1.005–1.030)
Urobilinogen, UA: 0.2 mg/dL (ref 0.0–1.0)

## 2016-08-29 LAB — POCT GLYCOSYLATED HEMOGLOBIN (HGB A1C): Hemoglobin A1C: 14.5

## 2016-08-29 LAB — GLUCOSE, CAPILLARY
GLUCOSE-CAPILLARY: 334 mg/dL — AB (ref 65–99)
GLUCOSE-CAPILLARY: 276 mg/dL — AB (ref 65–99)
Glucose-Capillary: 431 mg/dL — ABNORMAL HIGH (ref 65–99)

## 2016-08-29 MED ORDER — SODIUM CHLORIDE 0.9 % IV SOLN
Freq: Once | INTRAVENOUS | Status: AC
  Administered 2016-08-29: 11:00:00 via INTRAVENOUS

## 2016-08-29 MED ORDER — INSULIN ASPART 100 UNIT/ML ~~LOC~~ SOLN: SUBCUTANEOUS | 11 refills | Status: DC

## 2016-08-29 MED ORDER — INSULIN LISPRO PROT & LISPRO (50-50 MIX) 100 UNIT/ML ~~LOC~~ SUSP: 10.0000 [IU] | Freq: Once | SUBCUTANEOUS | Status: DC

## 2016-08-29 NOTE — Patient Instructions (Addendum)
Will start sliding scale insulin (Novolog) <150     = 0 units 150-199= 2 units 200-249= 4 units 250-299= 6 units 300-349= 8 units >350 = 10 units Will continue Levemir at previous dosage.  Will continue blood pressure medication as written.  Return to clinic in 1 month for fasting labs. Do not eat or drink after midnight.  Diabetes and Foot Care Diabetes may cause you to have problems because of poor blood supply (circulation) to your feet and legs. This may cause the skin on your feet to become thinner, break easier, and heal more slowly. Your skin may become dry, and the skin may peel and crack. You may also have nerve damage in your legs and feet causing decreased feeling in them. You may not notice minor injuries to your feet that could lead to infections or more serious problems. Taking care of your feet is one of the most important things you can do for yourself. Follow these instructions at home:  Wear shoes at all times, even in the house. Do not go barefoot. Bare feet are easily injured.  Check your feet daily for blisters, cuts, and redness. If you cannot see the bottom of your feet, use a mirror or ask someone for help.  Wash your feet with warm water (do not use hot water) and mild soap. Then pat your feet and the areas between your toes until they are completely dry. Do not soak your feet as this can dry your skin.  Apply a moisturizing lotion or petroleum jelly (that does not contain alcohol and is unscented) to the skin on your feet and to dry, brittle toenails. Do not apply lotion between your toes.  Trim your toenails straight across. Do not dig under them or around the cuticle. File the edges of your nails with an emery board or nail file.  Do not cut corns or calluses or try to remove them with medicine.  Wear clean socks or stockings every day. Make sure they are not too tight. Do not wear knee-high stockings since they may decrease blood flow to your legs.  Wear  shoes that fit properly and have enough cushioning. To break in new shoes, wear them for just a few hours a day. This prevents you from injuring your feet. Always look in your shoes before you put them on to be sure there are no objects inside.  Do not cross your legs. This may decrease the blood flow to your feet.  If you find a minor scrape, cut, or break in the skin on your feet, keep it and the skin around it clean and dry. These areas may be cleansed with mild soap and water. Do not cleanse the area with peroxide, alcohol, or iodine.  When you remove an adhesive bandage, be sure not to damage the skin around it.  If you have a wound, look at it several times a day to make sure it is healing.  Do not use heating pads or hot water bottles. They may burn your skin. If you have lost feeling in your feet or legs, you may not know it is happening until it is too late.  Make sure your health care provider performs a complete foot exam at least annually or more often if you have foot problems. Report any cuts, sores, or bruises to your health care provider immediately. Contact a health care provider if:  You have an injury that is not healing.  You have cuts or breaks  in the skin.  You have an ingrown nail.  You notice redness on your legs or feet.  You feel burning or tingling in your legs or feet.  You have pain or cramps in your legs and feet.  Your legs or feet are numb.  Your feet always feel cold. Get help right away if:  There is increasing redness, swelling, or pain in or around a wound.  There is a red line that goes up your leg.  Pus is coming from a wound.  You develop a fever or as directed by your health care provider.  You notice a bad smell coming from an ulcer or wound. This information is not intended to replace advice given to you by your health care provider. Make sure you discuss any questions you have with your health care provider. Document Released:  09/05/2000 Document Revised: 02/14/2016 Document Reviewed: 02/15/2013 Elsevier Interactive Patient Education  2017 [place].  Diabetes Mellitus and Exercise Exercising regularly is important for your overall health, especially when you have diabetes (diabetes mellitus). Exercising is not only about losing weight. It has many health benefits, such as increasing muscle strength and bone density and reducing body fat and stress. This leads to improved fitness, flexibility, and endurance, all of which result in better overall health. Exercise has additional benefits for people with diabetes, including:  Reducing appetite.  Helping to lower and control blood glucose.  Lowering blood pressure.  Helping to control amounts of fatty substances (lipids) in the blood, such as cholesterol and triglycerides.  Helping the body to respond better to insulin (improving insulin sensitivity).  Reducing how much insulin the body needs.  Decreasing the risk for heart disease by:  Lowering cholesterol and triglyceride levels.  Increasing the levels of good cholesterol.  Lowering blood glucose levels. What is my activity plan? Your health care provider or certified diabetes educator can help you make a plan for the type and frequency of exercise (activity plan) that works for you. Make sure that you:  Do at least 150 minutes of moderate-intensity or vigorous-intensity exercise each week. This could be brisk walking, biking, or water aerobics.  Do stretching and strength exercises, such as yoga or weightlifting, at least 2 times a week.  Spread out your activity over at least 3 days of the week.  Get some form of physical activity every day.  Do not go more than 2 days in a row without some kind of physical activity.  Avoid being inactive for more than 90 minutes at a time. Take frequent breaks to walk or stretch.  Choose a type of exercise or activity that you enjoy, and set realistic  goals.  Start slowly, and gradually increase the intensity of your exercise over time. What do I need to know about managing my diabetes?  Check your blood glucose before and after exercising.  If your blood glucose is higher than 240 mg/dL (13.3 mmol/L) before you exercise, check your urine for ketones. If you have ketones in your urine, do not exercise until your blood glucose returns to normal.  Know the symptoms of low blood glucose (hypoglycemia) and how to treat it. Your risk for hypoglycemia increases during and after exercise. Common symptoms of hypoglycemia can include:  Hunger.  Anxiety.  Sweating and feeling clammy.  Confusion.  Dizziness or feeling light-headed.  Increased heart rate or palpitations.  Blurry vision.  Tingling or numbness around the mouth, lips, or tongue.  Tremors or shakes.  Irritability.  Keep  a rapid-acting carbohydrate snack available before, during, and after exercise to help prevent or treat hypoglycemia.  Avoid injecting insulin into areas of the body that are going to be exercised. For example, avoid injecting insulin into:  The arms, when playing tennis.  The legs, when jogging.  Keep records of your exercise habits. Doing this can help you and your health care provider adjust your diabetes management plan as needed. Write down:  Food that you eat before and after you exercise.  Blood glucose levels before and after you exercise.  The type and amount of exercise you have done.  When your insulin is expected to peak, if you use insulin. Avoid exercising at times when your insulin is peaking.  When you start a new exercise or activity, work with your health care provider to make sure the activity is safe for you, and to adjust your insulin, medicines, or food intake as needed.  Drink plenty of water while you exercise to prevent dehydration or heat stroke. Drink enough fluid to keep your urine clear or pale yellow. This  information is not intended to replace advice given to you by your health care provider. Make sure you discuss any questions you have with your health care provider. Document Released: 11/29/2003 Document Revised: 03/28/2016 Document Reviewed: 02/18/2016 Elsevier Interactive Patient Education  2017 [place].  Managing Your Hypertension Hypertension is commonly called high blood pressure. Blood pressure is a measurement of how strongly your blood is pressing against the walls of your arteries. Arteries are blood vessels that carry blood from your heart throughout your body. Blood pressure does not stay the same. It rises when you are active, excited, or nervous. It lowers when you are sleeping or relaxed. If the numbers that measure your blood pressure stay above normal most of the time, you are at risk for health problems. Hypertension is a long-term (chronic) condition in which blood pressure is elevated. This condition often has no signs or symptoms. The cause of the condition is usually not known. What are blood pressure readings? A blood pressure reading is recorded as two numbers, such as "120 over 80" (or 120/80). The first ("top") number is called the systolic pressure. It is a measure of the pressure in your arteries as the heart beats. The second ("bottom") number is called the diastolic pressure. It is a measure of the pressure in your arteries as the heart relaxes between beats. What does my blood pressure reading mean? Blood pressure is classified into four stages. Based on your blood pressure reading, your health care provider may use the following stages to determine what type of treatment, if any, is needed. Systolic pressure and diastolic pressure are measured in a unit called mm Hg. Normal  Systolic pressure: below 123456.  Diastolic pressure: below 80. Prehypertension  Systolic pressure: 123456.  Diastolic pressure: [id number]. Hypertension stage 1  Systolic pressure:  [id number].  Diastolic pressure: [id number]. Hypertension stage 2  Systolic pressure: 0000000 or above.  Diastolic pressure: [id number] or above. What health risks are associated with hypertension? Managing your hypertension is an important responsibility. Uncontrolled hypertension can lead to:  A heart attack.  A stroke.  A weakened blood vessel (aneurysm).  Heart failure.  Kidney damage.  Eye damage.  Metabolic syndrome.  Memory and concentration problems. What changes can I make to manage my hypertension? Hypertension can be managed effectively by making lifestyle changes and possibly by taking medicines. Your health care provider will help you come up with a plan  to bring your blood pressure within a normal range. Your plan should include the following: Monitoring  Monitor your blood pressure at home as told by your health care provider. Your personal target blood pressure may vary depending on your medical conditions, your age, and other factors.  Have your blood pressure rechecked as told by your health care provider. Lifestyle  Lose weight if necessary.  Get at least 30-45 minutes of aerobic exercise at least 4 times a week.  Do not use any products that contain nicotine or tobacco, such as cigarettes and e-cigarettes. If you need help quitting, ask your health care provider.  Learn ways to reduce stress.  Control any chronic conditions, such as high cholesterol or diabetes. Eating and drinking  Follow the DASH diet. This diet is high in fruits, vegetables, and whole grains. It is low in salt, red meat, and added sugars.  Keep your sodium intake below 2,300 mg per day.  Limit alcoholic beverages. Communication  Review all the medicines you take with your health care provider because there may be side effects or interactions.  Talk with your health care provider about your diet, exercise habits, and other lifestyle factors that may be contributing to hypertension.  See  your health care provider regularly. Your health care provider can help you create and adjust your plan for managing hypertension. Will I need medicine to control my blood pressure? Your health care provider may prescribe medicine if lifestyle changes are not enough to get your blood pressure under control, and if one of the following is true:  You are 70-70 years of age, and your systolic blood pressure is 140 or higher.  You are 74 years of age or older, and your systolic blood pressure is 150 or higher.  Your diastolic blood pressure is 90 or higher.  You have diabetes, and your systolic blood pressure is over [id number] or your diastolic blood pressure is over 90.  You have kidney disease, and your blood pressure is above 140/90.  You have heart disease or a history of stroke, and your blood pressure is 140/90 or higher. Take medicines only as told by your health care provider. Follow the directions carefully. Blood pressure medicines must be taken as prescribed. The medicine does not work as well when you skip doses. Skipping doses also puts you at risk for problems. Contact a health care provider if:  You think you are having a reaction to medicines you have taken.  You have repeated (recurrent) headaches.  You feel dizzy.  You have swelling in your ankles.  You have trouble with your vision. Get help right away if:  You develop a severe headache or confusion.  You have unusual weakness or numbness, or you feel faint.  You have severe pain in your chest or abdomen.  You vomit repeatedly.  You have trouble breathing. This information is not intended to replace advice given to you by your health care provider. Make sure you discuss any questions you have with your health care provider. Document Released: 06/02/2012 Document Revised: 05/13/2016 Document Reviewed: 12/07/2015 Elsevier Interactive Patient Education  2017 [employer].

## 2016-08-29 NOTE — Discharge Instructions (Signed)
Diabetes Mellitus and Standards of Medical Care Managing diabetes (diabetes mellitus) can be complicated. Your diabetes treatment may be managed by a team of health care providers, including:  A diet and nutrition specialist (registered dietitian).  A nurse.  A certified diabetes educator (CDE).  A diabetes specialist (endocrinologist).  An eye doctor.  A primary care provider.  A dentist. Your health care providers follow a schedule in order to help you get the best quality of care. The following schedule is a general guideline for your diabetes management plan. Your health care providers may also give you more specific instructions. HbA1c ( hemoglobin A1c) test This test provides information about blood sugar (glucose) control over the previous 2-3 months. It is used to check whether your diabetes management plan needs to be adjusted.  If you are meeting your treatment goals, this test is done at least 2 times a year.  If you are not meeting treatment goals or if your treatment goals have changed, this test is done 4 times a year. Blood pressure test  This test is done at every routine medical visit. For most people, the goal is less than 140/90. In some cases, your goal blood pressure may be 130/80 or less. Ask your health care provider what your goal blood pressure should be. Dental and eye exams  Visit your dentist two times a year.  If you have type 1 diabetes, get an eye exam 3-5 years after you are diagnosed, and then once a year after your first exam.  If you were diagnosed with type 1 diabetes as a child, get an eye exam when you are age 55 or older and have had diabetes for 3-5 years. After the first exam, you should get an eye exam once a year.  If you have type 2 diabetes, have an eye exam as soon as you are diagnosed, and then once a year after your first exam. Foot care exam  Visual foot exams are done at every routine medical visit. The exams check for cuts,  bruises, redness, blisters, sores, or other problems with the feet.  A complete foot exam is done by your health care provider once a year. This exam includes an inspection of the structure and skin of your feet, and a check of the pulses and sensation in your feet.  Type 1 diabetes: Get your first exam 3-5 years after diagnosis.  Type 2 diabetes: Get your first exam as soon as you are diagnosed.  Check your feet every day for cuts, bruises, redness, blisters, or sores. If you have any of these or other problems that are not healing, contact your health care provider. Kidney function test ( urine microalbumin)  This test is done once a year.  Type 1 diabetes: Get your first test 5 years after diagnosis.  Type 2 diabetes: Get your first test as soon as you are diagnosed.  If you have chronic kidney disease (CKD), get a serum creatinine and estimated glomerular filtration rate (eGFR) test once a year. Lipid profile (cholesterol, HDL, LDL, triglycerides)  This test should be done when you are diagnosed with diabetes, and every 5 years after the first test. If you are on medicines to lower your cholesterol, you may need to get this test done every year.  The goal for LDL is less than 100 mg/dL (5.5 mmol/L). If you are at high risk, the goal is less than 70 mg/dL (3.9 mmol/L).  The goal for HDL is 40 mg/dL (2.2 mmol/L)  for men and 50 mg/dL(2.8 mmol/L) for women. An HDL cholesterol of 60 mg/dL (3.3 mmol/L) or higher gives some protection against heart disease.  The goal for triglycerides is less than 150 mg/dL (8.3 mmol/L). Immunizations  The yearly flu (influenza) vaccine is recommended for everyone 6 months or older who has diabetes.  The pneumonia (pneumococcal) vaccine is recommended for everyone 2 years or older who has diabetes. If you are 55 or older, you may get the pneumonia vaccine as a series of two separate shots.  The hepatitis B vaccine is recommended for adults shortly  after they have been diagnosed with diabetes.  The Tdap (tetanus, diphtheria, and pertussis) vaccine should be given:  According to normal childhood vaccination schedules, for children.  Every 10 years, for adults who have diabetes.  The shingles vaccine is recommended for people who have had chicken pox and are 50 years or older. Mental and emotional health  Screening for symptoms of eating disorders, anxiety, and depression is recommended at the time of diagnosis and afterward as needed. If your screening shows that you have symptoms (you have a positive screening result), you may need further evaluation and be referred to a mental health care provider. Diabetes self-management education  Education about how to manage your diabetes is recommended at diagnosis and ongoing as needed. Treatment plan  Your treatment plan will be reviewed at every medical visit. Summary  Managing diabetes (diabetes mellitus) can be complicated. Your diabetes treatment may be managed by a team of health care providers.  Your health care providers follow a schedule in order to help you get the best quality of care.  Standards of care including having regular physical exams, blood tests, blood pressure monitoring, immunizations, screening tests, and education about how to manage your diabetes.  Your health care providers may also give you more specific instructions based on your individual health. This information is not intended to replace advice given to you by your health care provider. Make sure you discuss any questions you have with your health care provider. Document Released: 07/06/2009 Document Revised: 06/06/2016 Document Reviewed: 06/06/2016 Elsevier Interactive Patient Education  2017 [employer].

## 2016-08-29 NOTE — Progress Notes (Signed)
Subjective:    Patient ID: Ryan Medina, male    DOB: [birth year], 55 y.o.   MRN: [id number]  HPI [name], a 55 year old male with a history of uncontrolled diabetes and hypertension presents to establish care. He has been lost to follow up due to financial constraints. He states that he has been taking medications consistently. He did not take insulin prior to arrival and had breakfast at 5 am. Patient's blood sugar was 451 on arrival. He is asymptomatic. He states that blood sugars have been consistently elevated. He is unable to follow a carbohydrate modified diet due to the fact that he a resident of the [facility] and all meals are planned.Patient denies foot ulcerations, hyperglycemia, nausea, paresthesia of the feet, polydipsia, polyuria, visual disturbances, vomitting and weight loss.  Patient also has a history of hypertension. Marland Kitchen He is not exercising and is not adherent to low salt diet. [name] does not check blood pressures at home. Patient denies chest pain, chest pressure/discomfort, fatigue, orthopnea, palpitations, syncope and tachypnea.  Cardiovascular risk factors: obesity (BMI >= 30 kg/m2), sedentary lifestyle and smoking/ tobacco exposure.  Social History   Social History  . Marital status: Single    Spouse name: N/A  . Number of children: N/A  . Years of education: N/A   Occupational History  . Not on file.   Social History Main Topics  . Smoking status: Current Some Day Smoker    Packs/day: 0.30    Years: 25.00    Types: Cigarettes  . Smokeless tobacco: Never Used  . Alcohol use No  . Drug use: No  . Sexual activity: Not on file   Other Topics Concern  . Not on file   Social History Narrative  . No narrative on file   Immunization History  Administered Date(s) Administered  . Influenza,inj,Quad PF,36+ Mos 07/22/2016  . Pneumococcal Polysaccharide-23 08/29/2016  . Tdap 08/29/2016   Review of Systems  Constitutional: Negative for fatigue.   HENT: Negative.   Eyes: Negative for photophobia and visual disturbance.  Respiratory: Negative.   Cardiovascular: Negative.   Gastrointestinal: Negative.   Endocrine: Positive for polydipsia. Negative for polyuria.  Genitourinary: Negative.   Musculoskeletal: Negative.   Skin: Negative.   Allergic/Immunologic: Negative for immunocompromised state.  Neurological: Negative.   Hematological: Negative.   Psychiatric/Behavioral: Negative.        Objective:   Physical Exam  Constitutional: He is oriented to person, place, and time. He appears well-developed and well-nourished.  HENT:  Head: Normocephalic and atraumatic.  Right Ear: External ear normal.  Left Ear: External ear normal.  Nose: Nose normal.  Mouth/Throat: Oropharynx is clear and moist.  Eyes: Conjunctivae and EOM are normal. Pupils are equal, round, and reactive to light.  Neck: Normal range of motion. Neck supple.  Cardiovascular: Normal rate, regular rhythm, normal heart sounds and intact distal pulses.   Pulmonary/Chest: Effort normal and breath sounds normal.  Abdominal: Soft. Bowel sounds are normal.  Musculoskeletal: Normal range of motion.  Neurological: He is alert and oriented to person, place, and time. He has normal reflexes.  Skin:  Lipoma to left forehead and right arm. Raised, round, non tender to palpation.   Psychiatric: He has a normal mood and affect. His behavior is normal. Judgment and thought content normal.      BP 133/89 (BP Location: Left Arm, Patient Position: Sitting, Cuff Size: Large)   Pulse 92   Temp 98.1 F (36.7 C) (Oral)  Resp 18   Ht 5\' 11"  (1.803 m)   Wt 213 lb (96.6 kg)   SpO2 100%   BMI 29.71 kg/m  Assessment & Plan:  1. Uncontrolled type 2 diabetes mellitus with hyperosmolarity without coma, without long-term current use of insulin (HCC) Blood sugar 451, patient received 10 units of Humalog. He received a bolus of saline.  Patient will follow up in 2 weeks with  glucometer.  Will start sliding scale insulin. Patient and I discussed medication regimen at length.  <150     = 0 units 150-199= 2 units 200-249= 4 units 250-299= 6 units 300-349= 8 units >350 = 10 units - HgB A1c - insulin lispro protamine-lispro (HUMALOG 50/50 MIX) (50-50) 100 UNIT/ML injection 10 Units; Inject 0.1 mLs (10 Units total) into the skin once. - insulin aspart (NOVOLOG) 100 UNIT/ML injection; Take insulin per sliding scale  Dispense: 10 mL; Refill: 11  2. Essential hypertension Blood pressure at goal on current medication regimen. The patient is asked to make an attempt to improve diet and exercise patterns to aid in medical management of this problem.  3. Immunization due - Tdap vaccine greater than or equal to 7yo IM - Pneumococcal polysaccharide vaccine 23-valent greater than or equal to 2yo subcutaneous/IM   RTC: 2 weeks for glucose check and glucometer  Patient will transition to the day infusion center for a saline bolus.   The patient was given clear instructions to go to ER or return to medical center if symptoms do not improve, worsen or new problems develop. The patient verbalized understanding. Will notify patient with laboratory results.  Dorena Dew, FNP

## 2016-08-29 NOTE — Progress Notes (Signed)
Pt received IV infusion 1 liter NS; POCT obtained at end of infusion; no complications noted  Associated Diagnosis: Uncontrolled type 2 diabetes mellitus with hyperosmolarity without coma, without long-term current use of insulin ([place]) (E11.00)  Ordering Provider: [place] Hollis, NP

## 2016-08-29 NOTE — Progress Notes (Signed)
Patient is here to establish care  Patient complains of 3 months of intermittent left leg pain which radiates from the lower back down to the calf.  Patient has not taken his medication today. Patient has eaten today.  Patient states his sugar level was 200+ this morning  Patient tolerated 10 units of insulin SQ in the right arm  Patient tolerated TDAP and Pneumococcal 23 well today in the right arm.

## 2016-09-02 NOTE — Congregational Nurse Program (Signed)
Congregational Nurse Program Note  Date of Encounter: 09/02/2016  Past Medical History: Past Medical History:  Diagnosis Date  . Back pain, chronic   . Diabetes ([place])   . HLD (hyperlipidemia)   . Hypertension   . IDDM (insulin dependent diabetes mellitus) (Ryan Medina)   . Ruptured lumbar disc     Encounter Details:     CNP Questionnaire - 09/02/16 1744      Patient Demographics   Is this a new or existing patient? Existing   Patient is considered a/an Not Applicable   Race African-American/Black     Patient Assistance   Location of Patient Assistance Not Applicable   Patient's financial/insurance status Medicaid   Uninsured Patient ([place] Card/Care Connects) No   Patient referred to apply for the following financial assistance Medicaid   Food insecurities addressed Not Applicable   Transportation assistance No   Assistance securing medications Yes   Type of Restaurant manager, fast food health offerings Medications;Navigating the healthcare system;Diabetes;Chronic disease;Hypertension     Encounter Details   Primary purpose of visit Education/Health Concerns;Chronic Illness/Condition Visit;Spiritual Care/Support Visit;Navigating the Healthcare System   Was an Emergency Department visit averted? Not Applicable   Does patient have a medical provider? Yes   Patient referred to Follow up with established PCP   Was a mental health screening completed? (GAINS tool) No   Does patient have dental issues? No   Does patient have vision issues? No   Does your patient have an abnormal blood pressure today? Yes   Since previous encounter, have you referred patient for abnormal blood pressure that resulted in a new diagnosis or medication change? No   Does your patient have an abnormal blood glucose today? Yes   Since previous encounter, have you referred patient for abnormal blood glucose that resulted in a new diagnosis or medication change? No   Was there a life-saving  intervention made? No         Amb Nursing Assessment - 08/29/16 0900      Pre-visit preparation   Pre-visit preparation completed Yes     Pain Assessment   Pain Assessment 0-10   Pain Score 4    Pain Type Acute pain   Pain Location Leg   Pain Orientation Left   Pain Descriptors / Indicators Aching   Pain Onset More than a month ago   Pain Frequency Intermittent     Nutrition Screen   Diabetes Yes   CBG done? Yes   CBG resulted in Enter/ Edit results? Yes     Functional Status   Activities of Daily Living Independent   Ambulation Independent   Medication Administration Independent   Home Management Independent     Abuse/Neglect Assessment   Do you feel unsafe in your current relationship? No   Do you feel physically threatened by others? No   Anyone hurting you at home, work, or school? No   Unable to ask? No    In for a weekly  Check of his  Blood  Pressure  And to monitor his  Diabetes. Client states he is taking his  Medications , checking his  Blood sugars in am and  Pm ,counseled on proper times to  Take  Blood  Sugars . [facility]  Blood  Work  Done  Has a script  For his  Novolog , will need  Refill order for Levemir. Nurse will take  Script  To pharmacy  For  Refill  of  Novolog , counseled  Client not  To run out of  Medications  ,given  Pharmacy  Number to  Call in 2-3  Days  Before  Refills . Counseled  Regarding  Smoking  And heart rate, smoke  About  1 hour  Before seeing nurse ,states he has come off most of his  Smoking , down to  3 per day . Seems to  Understand the need to  Quit.Blood  Sugar this  Am was 186 mg ,others have been 179 mg, 145 mg, and states  One am it was 78 mg. Counseled  Regarding the  Low  Blood  Sugar and what to  Look for . Will  Monitor weekly

## 2016-09-04 ENCOUNTER — Other Ambulatory Visit: Payer: Self-pay | Admitting: Family Medicine

## 2016-09-10 ENCOUNTER — Telehealth: Payer: Self-pay

## 2016-09-10 NOTE — Telephone Encounter (Signed)
Nurse  Tried to  Forensic scientist at [facility]  To  Assist  With  Getting  Refills on  Clients  Medications. Left  message  And she  Did  e-mail me  Back  But  She  Is  Unclear as to my needs for  Client. Left her a voicemail to see if medications  Can be called in for  Client Will continue to  Get  Clients  Medications to  Prevent another hospital admission due to  Non compliance . Marland Kitchen

## 2016-09-10 NOTE — Congregational Nurse Program (Signed)
Congregational Nurse Program Note  Date of Encounter: 09/09/2016  Past Medical History: Past Medical History:  Diagnosis Date  . Back pain, chronic   . Diabetes ([place])   . HLD (hyperlipidemia)   . Hypertension   . IDDM (insulin dependent diabetes mellitus) ([place])   . Ruptured lumbar disc     Encounter Details:     CNP Questionnaire - 09/10/16 1614      Patient Demographics   Is this a new or existing patient? Existing   Patient is considered a/an Not Applicable   Race African-American/Black     Patient Assistance   Location of Patient Assistance Not Applicable   Patient's financial/insurance status Medicaid   Uninsured Patient ([facility] Card/Care Connects) No   Patient referred to apply for the following financial assistance Not Applicable   Food insecurities addressed Not Applicable   Transportation assistance No   Assistance securing medications Yes   Type of Restaurant manager, fast food health offerings Chronic disease;Diabetes;Hypertension;Medications;Navigating the healthcare system     Encounter Details   Primary purpose of visit Chronic Illness/Condition Visit;Education/Health Concerns;Spiritual Care/Support Visit;Post PCP Visit   Was an Emergency Department visit averted? Not Applicable   Does patient have a medical provider? Yes   Patient referred to Follow up with established PCP;Area Agency   Was a mental health screening completed? (GAINS tool) No   Does patient have dental issues? No   Does patient have vision issues? No   Does your patient have an abnormal blood pressure today? Yes   Since previous encounter, have you referred patient for abnormal blood pressure that resulted in a new diagnosis or medication change? No   Does your patient have an abnormal blood glucose today? Yes   Since previous encounter, have you referred patient for abnormal blood glucose that resulted in a new diagnosis or medication change? No   Was there a life-saving  intervention made? No         Amb Nursing Assessment - 08/29/16 0900      Pre-visit preparation   Pre-visit preparation completed Yes     Pain Assessment   Pain Assessment 0-10   Pain Score 4    Pain Type Acute pain   Pain Location Leg   Pain Orientation Left   Pain Descriptors / Indicators Aching   Pain Onset More than a month ago   Pain Frequency Intermittent     Nutrition Screen   Diabetes Yes   CBG done? Yes   CBG resulted in Enter/ Edit results? Yes     Functional Status   Activities of Daily Living Independent   Ambulation Independent   Medication Administration Independent   Home Management Independent     Abuse/Neglect Assessment   Do you feel unsafe in your current relationship? No   Do you feel physically threatened by others? No   Anyone hurting you at home, work, or school? No   Unable to ask? No    Client in to see nurse for weekly visit.Nurse questioned [place] lient about his  Insulins to find out he broke his  Novolog bottle on saturday and has not  Been able to take it since then. Blood sugar this am was 288 mg . Cautioned  Regarding his levels ,  Taking his levemir but  almost  out of it . [facility]  And [facility] will [facility] his  Novolog  And will again send in the order to get his pharmacy Levemir .  Nurse dropped off his  prescription  For his  Novolog  Last week after client was seen on 08-29-16  For his  PCP visit.  Client will need scripts  For his  Oral medications  ,nurse will [facility]  To  Try to  Get those  Orders sent to the pharmacy as he will need those  Medications. Nurse attempting  To navigate for client.  Smoker had  Smoker  Just  Before coming  In blood  Pressure  132/89 , pulse  94.  Client needs assoist  To secure  Medications  And get  Back on his  Insulin.  Follow  Up  With  [place].

## 2016-09-10 NOTE — Telephone Encounter (Signed)
Nurse called to see if  Clients  PCP had  Called in an order to  Get clients other medications. No call  From PCP yet . Checked on delivery of  Insulin and they will deliever today if  Possible  If  Not  First  Delivery in am .

## 2016-09-11 ENCOUNTER — Other Ambulatory Visit: Payer: Self-pay

## 2016-09-11 DIAGNOSIS — E1100 Type 2 diabetes mellitus with hyperosmolarity without nonketotic hyperglycemic-hyperosmolar coma (NKHHC): Secondary | ICD-10-CM

## 2016-09-11 MED ORDER — INSULIN ASPART 100 UNIT/ML ~~LOC~~ SOLN: SUBCUTANEOUS | 11 refills | Status: DC

## 2016-09-11 MED ORDER — INSULIN DETEMIR 100 UNIT/ML ~~LOC~~ SOLN: SUBCUTANEOUS | 3 refills | Status: DC

## 2016-09-23 ENCOUNTER — Other Ambulatory Visit: Payer: Self-pay

## 2016-09-23 DIAGNOSIS — IMO0002 Reserved for concepts with insufficient information to code with codable children: Secondary | ICD-10-CM

## 2016-09-23 DIAGNOSIS — E1165 Type 2 diabetes mellitus with hyperglycemia: Secondary | ICD-10-CM

## 2016-09-23 DIAGNOSIS — E118 Type 2 diabetes mellitus with unspecified complications: Principal | ICD-10-CM

## 2016-09-23 MED ORDER — HYDROCHLOROTHIAZIDE 25 MG PO TABS: 25.0000 mg | ORAL_TABLET | Freq: Every day | ORAL | 3 refills | Status: DC

## 2016-09-23 MED ORDER — NITROGLYCERIN 0.4 MG SL SUBL: 0.4000 mg | SUBLINGUAL_TABLET | SUBLINGUAL | 3 refills | Status: DC | PRN

## 2016-09-23 MED ORDER — METFORMIN HCL 1000 MG PO TABS: 1000.0000 mg | ORAL_TABLET | Freq: Two times a day (BID) | ORAL | 3 refills | Status: DC

## 2016-09-23 MED ORDER — GABAPENTIN 300 MG PO CAPS: 300.0000 mg | ORAL_CAPSULE | Freq: Every day | ORAL | 3 refills | Status: DC

## 2016-09-23 MED ORDER — ASPIRIN 81 MG PO CHEW: 81.0000 mg | CHEWABLE_TABLET | Freq: Every day | ORAL | 3 refills | Status: DC

## 2016-09-23 MED ORDER — MELOXICAM 15 MG PO TABS: 15.0000 mg | ORAL_TABLET | Freq: Every day | ORAL | 3 refills | Status: DC

## 2016-09-23 MED ORDER — PANTOPRAZOLE SODIUM 40 MG PO TBEC: 40.0000 mg | DELAYED_RELEASE_TABLET | Freq: Every day | ORAL | 3 refills | Status: DC

## 2016-09-23 MED ORDER — AMLODIPINE BESYLATE 10 MG PO TABS: 10.0000 mg | ORAL_TABLET | Freq: Every day | ORAL | 3 refills | Status: DC

## 2016-09-23 NOTE — Telephone Encounter (Signed)
Re-sent rx for hctz to pharmacy via e-scribe. Thanks!

## 2016-09-23 NOTE — Telephone Encounter (Signed)
[facility] patients request. Thanks!

## 2016-09-23 NOTE — Congregational Nurse Program (Signed)
Congregational Nurse Program Note  Date of Encounter: 09/23/2016  Past Medical History: Past Medical History:  Diagnosis Date  . Back pain, chronic   . Diabetes ([place])   . HLD (hyperlipidemia)   . Hypertension   . IDDM (insulin dependent diabetes mellitus) ([place])   . Ruptured lumbar disc     Encounter Details:     CNP Questionnaire - 09/23/16 1833      Patient Demographics   Is this a new or existing patient? Existing   Patient is considered a/an Not Applicable   Race African-American/Black     Patient Assistance   Location of Patient Assistance Not Applicable   Patient's financial/insurance status Medicaid   Uninsured Patient ([place] Card/Care Connects) No   Patient referred to apply for the following financial assistance Not Applicable   Food insecurities addressed Not Applicable   Transportation assistance No   Assistance securing medications Yes   Type of Engineer, building services Navigating the healthcare system;Diabetes;Chronic disease;Hypertension;Exercise/physical activity     Encounter Details   Primary purpose of visit Chronic Illness/Condition Visit;Education/Health Concerns;Navigating the Healthcare System   Was an Emergency Department visit averted? Not Applicable   Does patient have a medical provider? Yes   Patient referred to [facility];Follow up with established PCP   Was a mental health screening completed? (GAINS tool) No   Does patient have dental issues? No   Does patient have vision issues? No   Does your patient have an abnormal blood pressure today? Yes   Since previous encounter, have you referred patient for abnormal blood pressure that resulted in a new diagnosis or medication change? No   Does your patient have an abnormal blood glucose today? Yes   Since previous encounter, have you referred patient for abnormal blood glucose that resulted in a new diagnosis or medication change? No   Was there a life-saving  intervention made? No    In  To  See the Nurse client still doesn't  Have  His  Medications  And has been out  For  1 week now. He  States he  Called again and  Also  ;left a message . Very frustrated  And  States  I need to  Switch  PCP again. Nurse  [facility]  [facility]  [facility] and  Spoke  With a  representative  Explained  The  Calls  And  Difficulty in  Getting the  clients  Medications  As we  Both  Have  Tried. She  Assured  Me that  Medications  Would  Be  Called in to  [facility]  Today .Marland Kitchen Nurse  Ask nif  They  Could  Be  Called  In now  Delivery  Could  Be  For  Tomorrow.  Nurse  waited  A while  [place] called in finally  , nurse will send a referral to  Get  Clients  Medication as blood  pressure is  Starting to  Rise  And  Blood  Sugars  Are  High  With out his  metformin . Blood  Sugar  This  Am  Was 253 mg per client .Counseled  Regarding a little  Exercise after meals  ,states he does  Walk . Client is  Having a problem with  Nutrition ,making  Better food  Choices  And  Is  Still smoking  But  Is  Monitoring his  Blood  Sugars. Will follow up on his medications tomorrow.

## 2016-09-30 ENCOUNTER — Ambulatory Visit: Payer: Self-pay | Admitting: Family Medicine

## 2016-10-06 NOTE — Congregational Nurse Program (Signed)
Congregational Nurse Program Note  Date of Encounter: 09/24/2016  Past Medical History: Past Medical History:  Diagnosis Date  . Back pain, chronic   . Diabetes ([place])   . HLD (hyperlipidemia)   . Hypertension   . IDDM (insulin dependent diabetes mellitus) ([place])   . Ruptured lumbar disc     Encounter Details:     CNP Questionnaire - 10/06/16 2257      Patient Demographics   Is this a new or existing patient? Existing   Patient is considered a/an Not Applicable   Race African-American/Black     Patient Assistance   Location of Patient Assistance Not Applicable   Patient's financial/insurance status Medicaid   Uninsured Patient ([place] Card/Care Connects) No   Patient referred to apply for the following financial assistance Not Applicable   Food insecurities addressed Not Applicable   Transportation assistance No   Assistance securing medications Yes   Type of Restaurant manager, fast food health offerings Diabetes;Chronic disease;Hypertension     Encounter Details   Primary purpose of visit Navigating the Healthcare System;Education/Health Concerns;Chronic Illness/Condition Visit   Was an Emergency Department visit averted? Yes   Does patient have a medical provider? Yes   Patient referred to Follow up with established PCP   Was a mental health screening completed? (GAINS tool) No   Does patient have dental issues? No   Does patient have vision issues? No   Does your patient have an abnormal blood pressure today? No   Since previous encounter, have you referred patient for abnormal blood pressure that resulted in a new diagnosis or medication change? No   Does your patient have an abnormal blood glucose today? Yes   Since previous encounter, have you referred patient for abnormal blood glucose that resulted in a new diagnosis or medication change? No   Was there a life-saving intervention made? Yes    Brief  Visit  Today  Medications  Secured  From  pharmacy  Ands  Client took his  Blood  Pressure  And  Other  Diabetes  Medications. Nurse will follow  Weekly . Client has  Been out of  Medications for over 1 week .

## 2016-10-06 NOTE — Congregational Nurse Program (Signed)
Congregational Nurse Program Note  Date of Encounter: 10/01/2016  Past Medical History: Past Medical History:  Diagnosis Date  . Back pain, chronic   . Diabetes ([place])   . HLD (hyperlipidemia)   . Hypertension   . IDDM (insulin dependent diabetes mellitus) ([place])   . Ruptured lumbar disc     Encounter Details:     CNP Questionnaire - 10/06/16 2304      Patient Demographics   Is this a new or existing patient? Existing   Patient is considered a/an Not Applicable   Race African-American/Black     Patient Assistance   Location of Patient Assistance Not Applicable   Patient's financial/insurance status Medicaid   Uninsured Patient ([place] Card/Care Connects) No   Patient referred to apply for the following financial assistance Not Applicable   Food insecurities addressed Not Applicable   Transportation assistance No   Assistance securing medications No   Educational health offerings Medications;Diabetes;Hypertension     Encounter Details   Primary purpose of visit Chronic Illness/Condition Visit;Education/Health Concerns;Spiritual Care/Support Visit   Was an Emergency Department visit averted? Not Applicable   Does patient have a medical provider? Yes   Patient referred to Follow up with established PCP   Was a mental health screening completed? (GAINS tool) No   Does patient have dental issues? No   Does patient have vision issues? No   Does your patient have an abnormal blood pressure today? Yes   Does your patient have an abnormal blood glucose today? Yes   Since previous encounter, have you referred patient for abnormal blood glucose that resulted in a new diagnosis or medication change? No   Was there a life-saving intervention made? No     Client back on his  Medications  For  1 week  And  Blood  Pressure is  Up.,counseled  States  He  Has been taking  Taking it . Has smoked  Some  Today ,having  Muscle  Spasms . States  He picked up  Some things the  Wrong way .  Suggested he  Take his  Pain med's . Reports  Sugars in am  are 296 mg , last week reading  Were 179 mg, 186 mg, 197 mg. Nurse gave client a new meter ,he will need to purchase  Strips ,kit only has 10 .Marland Kitchen Counseled  Regarding his levels and his  Nutrition .  Follow  weekly

## 2016-10-12 ENCOUNTER — Emergency Department (HOSPITAL_COMMUNITY)
Admission: EM | Admit: 2016-10-12 | Discharge: 2016-10-13 | Disposition: A | Discharge status: Home / Self Care | Payer: Medicaid Other | Source: Home / Self Care | Attending: Emergency Medicine | Admitting: Emergency Medicine

## 2016-10-12 ENCOUNTER — Emergency Department (HOSPITAL_COMMUNITY): Payer: Medicaid Other

## 2016-10-12 ENCOUNTER — Encounter (HOSPITAL_COMMUNITY): Payer: Self-pay | Admitting: Emergency Medicine

## 2016-10-12 DIAGNOSIS — F17210 Nicotine dependence, cigarettes, uncomplicated: Secondary | ICD-10-CM

## 2016-10-12 DIAGNOSIS — Z794 Long term (current) use of insulin: Secondary | ICD-10-CM | POA: Insufficient documentation

## 2016-10-12 DIAGNOSIS — R079 Chest pain, unspecified: Secondary | ICD-10-CM | POA: Diagnosis present

## 2016-10-12 DIAGNOSIS — E1165 Type 2 diabetes mellitus with hyperglycemia: Secondary | ICD-10-CM | POA: Insufficient documentation

## 2016-10-12 DIAGNOSIS — E119 Type 2 diabetes mellitus without complications: Secondary | ICD-10-CM | POA: Diagnosis not present

## 2016-10-12 DIAGNOSIS — Z955 Presence of coronary angioplasty implant and graft: Secondary | ICD-10-CM | POA: Insufficient documentation

## 2016-10-12 DIAGNOSIS — I10 Essential (primary) hypertension: Secondary | ICD-10-CM

## 2016-10-12 DIAGNOSIS — R0789 Other chest pain: Secondary | ICD-10-CM | POA: Insufficient documentation

## 2016-10-12 DIAGNOSIS — Z79899 Other long term (current) drug therapy: Secondary | ICD-10-CM | POA: Diagnosis not present

## 2016-10-12 LAB — BASIC METABOLIC PANEL
ANION GAP: 8 (ref 5–15)
BUN: 22 mg/dL — ABNORMAL HIGH (ref 6–20)
CO2: 26 mmol/L (ref 22–32)
Calcium: 9.4 mg/dL (ref 8.9–10.3)
Chloride: 106 mmol/L (ref 101–111)
Creatinine, Ser: 1.19 mg/dL (ref 0.61–1.24)
GFR calc Af Amer: >60 mL/min (ref >60)
Glucose, Bld: 156 mg/dL — ABNORMAL HIGH (ref 65–99)
POTASSIUM: 4.2 mmol/L (ref 3.5–5.1)
SODIUM: 140 mmol/L (ref 135–145)

## 2016-10-12 LAB — CBC
HEMATOCRIT: 38.7 % — AB (ref 39.0–52.0)
HEMOGLOBIN: 12.9 g/dL — AB (ref 13.0–17.0)
MCH: 29.4 pg (ref 26.0–34.0)
MCHC: 33.3 g/dL (ref 30.0–36.0)
MCV: 88.2 fL (ref 78.0–100.0)
Platelets: 273 10*3/uL (ref 150–400)
RBC: 4.39 MIL/uL (ref 4.22–5.81)
RDW: 13.3 % (ref 11.5–15.5)
WBC: 6.7 10*3/uL (ref 4.0–10.5)

## 2016-10-12 LAB — CBG MONITORING, ED: GLUCOSE-CAPILLARY: 145 mg/dL — AB (ref 65–99)

## 2016-10-12 LAB — I-STAT TROPONIN, ED: Troponin i, poc: 0 ng/mL (ref 0.00–0.08)

## 2016-10-12 MED ORDER — METOCLOPRAMIDE HCL 5 MG/ML IJ SOLN
10.0000 mg | Freq: Once | INTRAMUSCULAR | Status: AC
  Administered 2016-10-12: 10 mg via INTRAVENOUS
  Filled 2016-10-12: qty 2

## 2016-10-12 MED ORDER — DIPHENHYDRAMINE HCL 50 MG/ML IJ SOLN
25.0000 mg | Freq: Once | INTRAMUSCULAR | Status: AC
Start: 2016-10-12 — End: 2016-10-12
  Administered 2016-10-12: 25 mg via INTRAVENOUS
  Filled 2016-10-12: qty 1

## 2016-10-12 MED ORDER — SODIUM CHLORIDE 0.9 % IV BOLUS (SEPSIS)
1000.0000 mL | Freq: Once | INTRAVENOUS | Status: AC
  Administered 2016-10-12: 1000 mL via INTRAVENOUS

## 2016-10-12 MED ORDER — KETOROLAC TROMETHAMINE 15 MG/ML IJ SOLN
15.0000 mg | Freq: Once | INTRAMUSCULAR | Status: AC
  Administered 2016-10-12: 15 mg via INTRAVENOUS
  Filled 2016-10-12: qty 1

## 2016-10-12 NOTE — ED Triage Notes (Signed)
Onset one day ago developed chest pain spasms took nitro last night with some relief today pain continued currently 8/10 pressure and currently denies shortness of breath. Also thought blood sugar might be high. Airway intact bilateral equal chest rise and fall.

## 2016-10-12 NOTE — ED Provider Notes (Signed)
[facility] DEPT Provider Note   CSN: [id number] Arrival date & time: 10/12/16  1846     History   Chief Complaint Chief Complaint  Patient presents with  . Chest Pain  . Hyperglycemia    HPI Ryan Medina is a 56 y.o. male.  HPI 56 yo M with h/o HTN, HLD, DM, chronic atypical chest pain s/p clean coronary cath in 04/2016 here with chest "spasms" and hyperglycemia. Regarding his chest pain, pt has chronic CP for which he has been evaluated extensively, including neg cardiac cath. He takes nitro 3-4x daily for this. Over the past several hours, he has had aching, throbbing, cramp like spasms of his chest that "shoot" toward his b/l shoulder. Sx worsen with coughing and movement. Minimal SOB, which is not abnormal for him. He has mild relief with nitro which is usual for him. No vomiting or abdominal pain. Pt also c/o high blood sugars, stating his sugars have been 200-300. No recent med changes. He has not been eating a diabetic diet. No recent illnesses.  Past Medical History:  Diagnosis Date  . Back pain, chronic   . Diabetes ([place])   . HLD (hyperlipidemia)   . Hypertension   . IDDM (insulin dependent diabetes mellitus) ([place])   . Ruptured lumbar disc     Patient Active Problem List   Diagnosis Date Noted  . Abnormal nuclear stress test 05/19/2016  . Atypical angina ([place]) 04/25/2016  . DM (diabetes mellitus), type 2, uncontrolled ([place]) 03/14/2015  . Essential hypertension 03/14/2015  . Hyperglycemia 01/14/2015    Past Surgical History:  Procedure Laterality Date  . ABDOMINAL SURGERY    . APPENDECTOMY    . BACK SURGERY    . CARDIAC CATHETERIZATION N/A 05/19/2016   Procedure: Left Heart Cath and Coronary Angiography;  Surgeon: Leonie Man, MD;  Location: [facility];  Service: Cardiovascular;  Laterality: N/A;       Home Medications    Prior to Admission medications   Medication Sig Start Date End Date Taking? Authorizing Provider  amLODipine  (NORVASC) 10 MG tablet Take 1 tablet (10 mg total) by mouth daily. 09/23/16  Yes Dorena Dew, FNP  gabapentin (NEURONTIN) 300 MG capsule Take 1 capsule (300 mg total) by mouth at bedtime. 09/23/16  Yes Dorena Dew, FNP  hydrochlorothiazide (HYDRODIURIL) 25 MG tablet Take 1 tablet (25 mg total) by mouth daily. 09/23/16  Yes Dorena Dew, FNP  insulin aspart (NOVOLOG) 100 UNIT/ML injection Take insulin per sliding scale 09/11/16  Yes Dorena Dew, FNP  insulin detemir (LEVEMIR) 100 UNIT/ML injection INJECT 20 UNITS SUBCUTANEOUSLY 2 TIMES DAILY 09/11/16  Yes Dorena Dew, FNP  meloxicam (MOBIC) 15 MG tablet Take 1 tablet (15 mg total) by mouth daily. 09/23/16  Yes Dorena Dew, FNP  metFORMIN (GLUCOPHAGE) 1000 MG tablet Take 1 tablet (1,000 mg total) by mouth 2 (two) times daily with a meal. 09/23/16  Yes Dorena Dew, FNP  nitroGLYCERIN (NITROSTAT) 0.4 MG SL tablet Place 1 tablet (0.4 mg total) under the tongue every 5 (five) minutes as needed for chest pain (x 3 doses). 09/23/16  Yes Dorena Dew, FNP  pantoprazole (PROTONIX) 40 MG tablet Take 1 tablet (40 mg total) by mouth daily. 09/23/16  Yes Dorena Dew, FNP  cyclobenzaprine (FLEXERIL) 10 MG tablet Take 1 tablet (10 mg total) by mouth 3 (three) times daily as needed for muscle spasms. 10/13/16   Duffy Bruce, MD    Family  History Family History  Problem Relation Age of Onset  . Diabetes Mother   . Heart failure Mother   . Diabetes Sister   . Diabetes Brother     Social History Social History  Substance Use Topics  . Smoking status: Current Some Day Smoker    Packs/day: 0.30    Years: 25.00    Types: Cigarettes  . Smokeless tobacco: Never Used  . Alcohol use No     Allergies   Lisinopril   Review of Systems Review of Systems  Constitutional: Positive for fatigue. Negative for chills and fever.  HENT: Negative for congestion and rhinorrhea.   Eyes: Negative for visual disturbance.  Respiratory:  Positive for chest tightness. Negative for cough, shortness of breath and wheezing.   Cardiovascular: Positive for chest pain. Negative for leg swelling.  Gastrointestinal: Negative for abdominal pain, diarrhea, nausea and vomiting.  Endocrine: Positive for polyuria.  Genitourinary: Negative for dysuria and flank pain.  Musculoskeletal: Negative for neck pain and neck stiffness.  Skin: Negative for rash and wound.  Allergic/Immunologic: Negative for immunocompromised state.  Neurological: Negative for syncope, weakness and headaches.  All other systems reviewed and are negative.    Physical Exam Updated Vital Signs BP 123/90   Pulse 61   Temp 98.3 F (36.8 C)   Resp 18   Ht 5\' 11"  (1.803 m)   Wt 204 lb (92.5 kg)   SpO2 97%   BMI 28.45 kg/m   Physical Exam  Constitutional: He is oriented to person, place, and time. He appears well-developed and well-nourished. No distress.  HENT:  Head: Normocephalic and atraumatic.  Eyes: Conjunctivae are normal.  Neck: Neck supple.  Cardiovascular: Normal rate, regular rhythm and normal heart sounds.  Exam reveals no friction rub.   No murmur heard. Pulmonary/Chest: Effort normal and breath sounds normal. No respiratory distress. He has no wheezes. He has no rales. He exhibits tenderness (TTP in b/l intercostal spaces of upper chest).  Abdominal: Soft. Bowel sounds are normal. He exhibits no distension.  Musculoskeletal: He exhibits no edema.  Neurological: He is alert and oriented to person, place, and time. He exhibits normal muscle tone.  Skin: Skin is warm. Capillary refill takes less than 2 seconds.  Psychiatric: He has a normal mood and affect.  Nursing note and vitals reviewed.    ED Treatments / Results  Labs (all labs ordered are listed, but only abnormal results are displayed) Labs Reviewed  BASIC METABOLIC PANEL - Abnormal; Notable for the following:       Result Value   Glucose, Bld 156 (*)    BUN 22 (*)    All other  components within normal limits  CBC - Abnormal; Notable for the following:    Hemoglobin 12.9 (*)    HCT 38.7 (*)    All other components within normal limits  CBG MONITORING, ED - Abnormal; Notable for the following:    Glucose-Capillary 145 (*)    All other components within normal limits  I-STAT TROPOININ, ED  I-STAT TROPOININ, ED  CBG MONITORING, ED    EKG  EKG Interpretation  Date/Time:  Sunday October 12 2016 18:58:06 EST Ventricular Rate:  81 PR Interval:  148 QRS Duration: 86 QT Interval:  330 QTC Calculation: 383 R Axis:   41 Text Interpretation:  Normal sinus rhythm T wave abnormality, consider inferolateral ischemia Abnormal ECG No significant change since last tracing Confirmed by Josede Cicero MD, Zoila Ditullio [phone]) on 10/13/2016 12:32:07 AM  Radiology Dg Chest 2 View  Result Date: 10/12/2016 CLINICAL DATA:  LEFT-sided chest pain.  Worsening. EXAM: CHEST  2 VIEW COMPARISON:  05/26/2016 FINDINGS: Normal cardiac silhouette. No effusion, infiltrate or pneumothorax. Lungs are hyperinflated. IMPRESSION: Hyperinflated lungs.  No acute findings. Electronically Signed   By: Suzy Bouchard M.D.   On: 10/12/2016 20:40    Procedures Procedures (including critical care time)  Medications Ordered in ED Medications  sodium chloride 0.9 % bolus 1,000 mL (0 mLs Intravenous Stopped 10/13/16 0028)  metoCLOPramide (REGLAN) injection 10 mg (10 mg Intravenous Given 10/12/16 2226)  diphenhydrAMINE (BENADRYL) injection 25 mg (25 mg Intravenous Given 10/12/16 2228)  ketorolac (TORADOL) 15 MG/ML injection 15 mg (15 mg Intravenous Given 10/12/16 2225)     Initial Impression / Assessment and Plan / ED Course  I have reviewed the triage vital signs and the nursing notes.  Pertinent labs & imaging results that were available during my care of the patient were reviewed by me and considered in my medical decision making (see chart for details).     56 yo M here with atypical chest pain,  hyperglycemia, also now c/o mild generalized HA  1.) CP - Low suspicion for ACA - Delta trop negative, recent cath is neg - EKG non-ischemic - Reproducible, suspect MSK chest pain - flexeril, OTC meds at home  2.) Hyperglycemia - BG <200 here - Normal CO2, no AG - do not suspect DKA - Encourage diabetic diet - Fluids  3.) HA - Mild, generalized, with h/o migraines - Suspect migraine/primary HA syndrome - No focal neuro deficits, doubt TIA, CVA - HA not c/w meningitis, encephalitis, SAH - Resolved with reglan/benadryl  Final Clinical Impressions(s) / ED Diagnoses   Final diagnoses:  Atypical chest pain  Chest wall pain    New Prescriptions Discharge Medication List as of 10/13/2016 12:20 AM    START taking these medications   Details  cyclobenzaprine (FLEXERIL) 10 MG tablet Take 1 tablet (10 mg total) by mouth 3 (three) times daily as needed for muscle spasms., Starting Mon 10/13/2016, Print         Duffy Bruce, MD 10/13/16 [phone]

## 2016-10-13 ENCOUNTER — Encounter (HOSPITAL_COMMUNITY): Payer: Self-pay

## 2016-10-13 ENCOUNTER — Emergency Department (HOSPITAL_COMMUNITY): Payer: Medicaid Other

## 2016-10-13 DIAGNOSIS — E119 Type 2 diabetes mellitus without complications: Secondary | ICD-10-CM | POA: Insufficient documentation

## 2016-10-13 DIAGNOSIS — F17210 Nicotine dependence, cigarettes, uncomplicated: Secondary | ICD-10-CM | POA: Insufficient documentation

## 2016-10-13 DIAGNOSIS — Z79899 Other long term (current) drug therapy: Secondary | ICD-10-CM | POA: Insufficient documentation

## 2016-10-13 DIAGNOSIS — I10 Essential (primary) hypertension: Secondary | ICD-10-CM | POA: Insufficient documentation

## 2016-10-13 DIAGNOSIS — R0789 Other chest pain: Secondary | ICD-10-CM | POA: Insufficient documentation

## 2016-10-13 DIAGNOSIS — Z794 Long term (current) use of insulin: Secondary | ICD-10-CM | POA: Insufficient documentation

## 2016-10-13 LAB — CBC
HEMATOCRIT: 39.4 % (ref 39.0–52.0)
HEMOGLOBIN: 12.9 g/dL — AB (ref 13.0–17.0)
MCH: 28.9 pg (ref 26.0–34.0)
MCHC: 32.7 g/dL (ref 30.0–36.0)
MCV: 88.1 fL (ref 78.0–100.0)
Platelets: 265 10*3/uL (ref 150–400)
RBC: 4.47 MIL/uL (ref 4.22–5.81)
RDW: 13.5 % (ref 11.5–15.5)
WBC: 6.6 10*3/uL (ref 4.0–10.5)

## 2016-10-13 LAB — BASIC METABOLIC PANEL
ANION GAP: 9 (ref 5–15)
BUN: 20 mg/dL (ref 6–20)
CHLORIDE: 106 mmol/L (ref 101–111)
CO2: 27 mmol/L (ref 22–32)
Calcium: 9.5 mg/dL (ref 8.9–10.3)
Creatinine, Ser: 1.23 mg/dL (ref 0.61–1.24)
GFR calc non Af Amer: >60 mL/min (ref >60)
GLUCOSE: 145 mg/dL — AB (ref 65–99)
POTASSIUM: 4.5 mmol/L (ref 3.5–5.1)
Sodium: 142 mmol/L (ref 135–145)

## 2016-10-13 LAB — I-STAT TROPONIN, ED
TROPONIN I, POC: 0 ng/mL (ref 0.00–0.08)
Troponin i, poc: 0 ng/mL (ref 0.00–0.08)

## 2016-10-13 MED ORDER — CYCLOBENZAPRINE HCL 10 MG PO TABS: 10.0000 mg | ORAL_TABLET | Freq: Three times a day (TID) | ORAL | 0 refills | Status: DC | PRN

## 2016-10-13 NOTE — ED Triage Notes (Signed)
Pt states he was seen yesterday for same chest wall pain; pt state he still having spasms in left chest area; pt states pressure at 8/10 on arrival; pt a&o 4; Pt state he also noticed a lump in the same area of left chest

## 2016-10-13 NOTE — Congregational Nurse Program (Signed)
Congregational Nurse Program Note  Date of Encounter: 10/07/2016  Past Medical History: Past Medical History:  Diagnosis Date  . Back pain, chronic   . Diabetes ([place])   . HLD (hyperlipidemia)   . Hypertension   . IDDM (insulin dependent diabetes mellitus) ([place])   . Ruptured lumbar disc     Encounter Details:     CNP Questionnaire - 10/13/16 1543      Patient Demographics   Is this a new or existing patient? Existing   Patient is considered a/an Not Applicable   Race African-American/Black     Patient Assistance   Location of Patient Assistance Not Applicable   Patient's financial/insurance status Medicaid   Uninsured Patient ([place] [employer]s) No   Patient referred to apply for the following financial assistance Not Applicable   Food insecurities addressed Not Applicable   Transportation assistance No   Assistance securing medications No   Educational health offerings Diabetes;Chronic disease     Encounter Details   Primary purpose of visit Spiritual Care/Support Visit;Education/Health Concerns   Was an Emergency Department visit averted? Not Applicable   Does patient have a medical provider? Yes   Patient referred to Follow up with established PCP   Was a mental health screening completed? (GAINS tool) No   Does patient have dental issues? No   Does patient have vision issues? No   Does your patient have an abnormal blood pressure today? No   Since previous encounter, have you referred patient for abnormal blood pressure that resulted in a new diagnosis or medication change? No   Does your patient have an abnormal blood glucose today? No   Since previous encounter, have you referred patient for abnormal blood glucose that resulted in a new diagnosis or medication change? No   Was there a life-saving intervention made? No    Brief  Visit  By  Client as  Nurse  Had  Someone else with  Her . Client states  He is  Doing  Okay ,hada hair  Cut , back pain better,  using his new  Meter ,states  Blood SUGAR ON 10-06-16 WAS 169 MG BUT  TODAY IT WAS 269 MG IN THE AM. counseled .   follow  WEEKLY

## 2016-10-14 ENCOUNTER — Emergency Department (HOSPITAL_COMMUNITY): Payer: Medicaid Other

## 2016-10-14 ENCOUNTER — Emergency Department (HOSPITAL_COMMUNITY)
Admission: EM | Admit: 2016-10-14 | Discharge: 2016-10-14 | Disposition: A | Discharge status: Home / Self Care | Payer: Medicaid Other | Attending: Emergency Medicine | Admitting: Emergency Medicine

## 2016-10-14 DIAGNOSIS — R079 Chest pain, unspecified: Secondary | ICD-10-CM

## 2016-10-14 MED ORDER — KETOROLAC TROMETHAMINE 60 MG/2ML IM SOLN
60.0000 mg | Freq: Once | INTRAMUSCULAR | Status: AC
  Administered 2016-10-14: 60 mg via INTRAMUSCULAR
  Filled 2016-10-14: qty 2

## 2016-10-14 NOTE — ED Notes (Signed)
Pt is now pain free. Pt verbalized understanding of d/c instructions and follow up. Pt ambulatory to [facility] with family

## 2016-10-14 NOTE — Discharge Instructions (Signed)
Resume taking your meloxicam (Mobic) once a day. Take acetaminophen as needed.

## 2016-10-14 NOTE — ED Notes (Signed)
Pt to CT via stretcher

## 2016-10-14 NOTE — ED Provider Notes (Signed)
[facility] DEPT Provider Note   CSN: [id number] Arrival date & time: 10/13/16  2023  By signing my name below, I, Dolores Hoose, attest that this documentation has been prepared under the direction and in the presence of Delora Fuel, MD . Electronically Signed: Dolores Hoose, Scribe. 10/14/2016. 1:18 AM.  History   Chief Complaint Chief Complaint  Patient presents with  . Chest Pain   The history is provided by the patient. No language interpreter was used.    HPI Comments:  Ryan Medina is a 56 y.o. male with pmhx of HTN, HLD, DM and cardiac catheterization who presents to the Emergency Department complaining of sudden-onset, constant, unchanged right-sided chest pain onset 4 days ago. No modifying factors indicated. He describes his symptoms as an 8/10 "heavy" pain. Pt reports associated diaphoresis. Pt was seen yesterday in the ED for his chest pain and given muscle relaxants with minimal relief. He has tried nitroglycerin at home with temporary relief. He is an everyday smoker.   Past Medical History:  Diagnosis Date  . Back pain, chronic   . Diabetes ([place])   . HLD (hyperlipidemia)   . Hypertension   . IDDM (insulin dependent diabetes mellitus) ([place])   . Ruptured lumbar disc     Patient Active Problem List   Diagnosis Date Noted  . Abnormal nuclear stress test 05/19/2016  . Atypical angina ([place]) 04/25/2016  . DM (diabetes mellitus), type 2, uncontrolled ([place]) 03/14/2015  . Essential hypertension 03/14/2015  . Hyperglycemia 01/14/2015    Past Surgical History:  Procedure Laterality Date  . ABDOMINAL SURGERY    . APPENDECTOMY    . BACK SURGERY    . CARDIAC CATHETERIZATION N/A 05/19/2016   Procedure: Left Heart Cath and Coronary Angiography;  Surgeon: Leonie Man, MD;  Location: [facility];  Service: Cardiovascular;  Laterality: N/A;     Home Medications    Prior to Admission medications   Medication Sig Start Date End Date Taking? Authorizing  Provider  amLODipine (NORVASC) 10 MG tablet Take 1 tablet (10 mg total) by mouth daily. 09/23/16   Dorena Dew, FNP  cyclobenzaprine (FLEXERIL) 10 MG tablet Take 1 tablet (10 mg total) by mouth 3 (three) times daily as needed for muscle spasms. 10/13/16   Duffy Bruce, MD  gabapentin (NEURONTIN) 300 MG capsule Take 1 capsule (300 mg total) by mouth at bedtime. 09/23/16   Dorena Dew, FNP  hydrochlorothiazide (HYDRODIURIL) 25 MG tablet Take 1 tablet (25 mg total) by mouth daily. 09/23/16   Dorena Dew, FNP  insulin aspart (NOVOLOG) 100 UNIT/ML injection Take insulin per sliding scale 09/11/16   Dorena Dew, FNP  insulin detemir (LEVEMIR) 100 UNIT/ML injection INJECT 20 UNITS SUBCUTANEOUSLY 2 TIMES DAILY 09/11/16   Dorena Dew, FNP  meloxicam (MOBIC) 15 MG tablet Take 1 tablet (15 mg total) by mouth daily. 09/23/16   Dorena Dew, FNP  metFORMIN (GLUCOPHAGE) 1000 MG tablet Take 1 tablet (1,000 mg total) by mouth 2 (two) times daily with a meal. 09/23/16   Dorena Dew, FNP  nitroGLYCERIN (NITROSTAT) 0.4 MG SL tablet Place 1 tablet (0.4 mg total) under the tongue every 5 (five) minutes as needed for chest pain (x 3 doses). 09/23/16   Dorena Dew, FNP  pantoprazole (PROTONIX) 40 MG tablet Take 1 tablet (40 mg total) by mouth daily. 09/23/16   Dorena Dew, FNP    Family History Family History  Problem Relation Age of Onset  .  Diabetes Mother   . Heart failure Mother   . Diabetes Sister   . Diabetes Brother     Social History Social History  Substance Use Topics  . Smoking status: Current Some Day Smoker    Packs/day: 0.30    Years: 25.00    Types: Cigarettes  . Smokeless tobacco: Never Used  . Alcohol use No     Allergies   Lisinopril   Review of Systems Review of Systems   Physical Exam Updated Vital Signs BP 137/96 (BP Location: Right Arm)   Pulse 70   Temp 98.8 F (37.1 C) (Oral)   Resp 16   SpO2 98%   Physical Exam  Constitutional: He  is oriented to person, place, and time. He appears well-developed and well-nourished.  HENT:  Head: Normocephalic and atraumatic.  Eyes: EOM are normal. Pupils are equal, round, and reactive to light.  Neck: Normal range of motion. Neck supple. No JVD present.  Cardiovascular: Normal rate, regular rhythm and normal heart sounds.   No murmur heard. Pulmonary/Chest: Effort normal and breath sounds normal. He has no wheezes. He has no rales. He exhibits no tenderness.  Abdominal: Soft. Bowel sounds are normal. He exhibits no distension and no mass. There is no tenderness.  Musculoskeletal: Normal range of motion. He exhibits no edema.  Lymphadenopathy:    He has no cervical adenopathy.  Neurological: He is alert and oriented to person, place, and time. No cranial nerve deficit. He exhibits normal muscle tone. Coordination normal.  Skin: Skin is warm and dry. No rash noted.  1.5cm subcutaneous nodules to left anterio-lateral chest wall. Freely movable. Non-tender.   Psychiatric: He has a normal mood and affect. His behavior is normal. Judgment and thought content normal.  Nursing note and vitals reviewed.    ED Treatments / Results  DIAGNOSTIC STUDIES:  Oxygen Saturation is 98% on RA, normal by my interpretation.    COORDINATION OF CARE:  1:22 AM Discussed treatment plan with pt at bedside which includes blood work and pt agreed to plan.  Labs (all labs ordered are listed, but only abnormal results are displayed) Labs Reviewed  BASIC METABOLIC PANEL - Abnormal; Notable for the following:       Result Value   Glucose, Bld 145 (*)    All other components within normal limits  CBC - Abnormal; Notable for the following:    Hemoglobin 12.9 (*)    All other components within normal limits  Randolm Idol, ED    EKG JAGUR, AKSAMIT [id number] 13-Oct-2016 20:32:56 [facility]/ED ROUTINE RECORD Normal sinus rhythm Nonspecific T wave abnormality Abnormal ECG When  compared with ECG of 10/12/2016, T wave inversion less evident in Inferior leads Confirmed by [facility] MD, Shadawn Hanaway ([id number]) on 10/14/2016 12:21:24 AM 85mm/s 74mm/mV 100Hz  9.0.4 12SL 241 CID: 59 Confirmed By: Delora Fuel MD Vent. rate 85 BPM PR interval 144 ms QRS duration 82 ms QT/QTc 368/437 ms P-R-T axes 72 46 80 1961/03/20 (11 yr) Male Black Room:A13C Loc:11 Technician: [phone] Test [id number] P  Radiology Dg Chest 2 View  Result Date: 10/13/2016 CLINICAL DATA:  Left-sided chest pain and spasm. EXAM: CHEST  2 VIEW COMPARISON:  None. FINDINGS: Lungs are hyperexpanded. Subtle nodular densities overlie the left lung, not definitely seen on the prior study. The cardiopericardial silhouette is within normal limits for size. The visualized bony structures of the thorax are intact. IMPRESSION: Subtle nodular opacities in the left lower lung. CT chest without contrast recommended to further evaluate.  Electronically Signed   By: Misty Stanley M.D.   On: 10/13/2016 21:42   Dg Chest 2 View  Result Date: 10/12/2016 CLINICAL DATA:  LEFT-sided chest pain.  Worsening. EXAM: CHEST  2 VIEW COMPARISON:  05/26/2016 FINDINGS: Normal cardiac silhouette. No effusion, infiltrate or pneumothorax. Lungs are hyperinflated. IMPRESSION: Hyperinflated lungs.  No acute findings. Electronically Signed   By: Suzy Bouchard M.D.   On: 10/12/2016 20:40   Ct Chest Wo Contrast  Result Date: 10/14/2016 CLINICAL DATA:  56 year old male with hypertension and abnormal chest radiograph. EXAM: CT CHEST WITHOUT CONTRAST TECHNIQUE: Multidetector CT imaging of the chest was performed following the standard protocol without IV contrast. COMPARISON:  Multiple chest radiographs and CT dating back to 04/25/2016. FINDINGS: Evaluation of this exam is limited in the absence of intravenous contrast. Cardiovascular: There is no cardiomegaly or pericardial effusion. There is mild coronary vascular calcification an early involving the RCA. The  thoracic aorta and central pulmonary arteries are grossly unremarkable on this noncontrast CT. There is common origin of the left common carotid artery and right brachiocephalic trunk. Mediastinum/Nodes: No hilar or mediastinal adenopathy. Evaluation is however limited in the absence of contrast. The esophagus and the thyroid gland are grossly unremarkable. Lungs/Pleura: Bibasilar linear and hazy densities and most likely atelectatic changes versus less likely areas of scarring. There is a 3 mm calcified nodule in the left lower lobe (series 7, image 85) similar to prior CT, likely an old granuloma. A 3 mm subpleural nodule noted along the right major fissure (series 7, image 88) similar to prior CT. The lungs are otherwise clear. There is no pleural effusion or pneumothorax. The central airways are patent. Upper Abdomen: A 1.6 x 1.9 cm ill-defined hypodense lesion in the left lobe of the liver demonstrates fluid attenuation. This lesion is not characterized on this noncontrast CT but most likely represents a cyst or hemangioma. MRI without and with contrast may provide better characterisation. The visualized upper abdomen is otherwise unremarkable. Musculoskeletal: No chest wall mass or suspicious bone lesions identified. IMPRESSION: No acute intrathoracic pathology. No suspicious nodule or mass. Small scattered benign appearing nodules as described. Bibasilar linear atelectasis/scarring.  No airspace disease. Left hepatic hypodense lesion, incompletely characterized, likely a cyst or hemangioma. MRI without and with contrast may provide better characterization. Electronically Signed   By: Anner Crete M.D.   On: 10/14/2016 02:41    Procedures Procedures (including critical care time)  Medications Ordered in ED Medications  ketorolac (TORADOL) injection 60 mg (60 mg Intramuscular Given 10/14/16 0148)     Initial Impression / Assessment and Plan / ED Course  I have reviewed the triage vital signs and  the nursing notes.  Pertinent labs & imaging results that were available during my care of the patient were reviewed by me and considered in my medical decision making (see chart for details).  Chest pain which has been continuous for several days that does not sound cardiac in any way. Old records are reviewed, and he had a cardiac catheterization on 05/19/2016 which showed normal coronary arteries and normal ventricular function. He does appear to have a subcutaneous cyst in the area where he is having pain, but this is unrelated. ECG is unremarkable and chest x-rays obtained which shows some questionable nodularities. CT without contrast is recommended and this was obtained which showed no suspicious lesions. He was given dose of ketorolac with good relief of pain. He does have meloxicam 15 mg at home but has not been  taking it. He is advised to resume taking it and use acetaminophen as needed for pain. Follow-up with PCP as needed.  Final Clinical Impressions(s) / ED Diagnoses   Final diagnoses:  None    New Prescriptions New Prescriptions   No medications on file   I personally performed the services described in this documentation, which was scribed in my presence. The recorded information has been reviewed and is accurate.       Delora Fuel, MD [phone] [id number]

## 2016-10-25 ENCOUNTER — Emergency Department (HOSPITAL_COMMUNITY): Payer: Medicaid Other

## 2016-10-25 ENCOUNTER — Emergency Department (HOSPITAL_COMMUNITY)
Admission: EM | Admit: 2016-10-25 | Discharge: 2016-10-26 | Disposition: A | Discharge status: Home / Self Care | Payer: Medicaid Other | Attending: Emergency Medicine | Admitting: Emergency Medicine

## 2016-10-25 ENCOUNTER — Encounter (HOSPITAL_COMMUNITY): Payer: Self-pay

## 2016-10-25 DIAGNOSIS — E119 Type 2 diabetes mellitus without complications: Secondary | ICD-10-CM | POA: Diagnosis not present

## 2016-10-25 DIAGNOSIS — R51 Headache: Secondary | ICD-10-CM | POA: Diagnosis not present

## 2016-10-25 DIAGNOSIS — Z794 Long term (current) use of insulin: Secondary | ICD-10-CM | POA: Diagnosis not present

## 2016-10-25 DIAGNOSIS — F17210 Nicotine dependence, cigarettes, uncomplicated: Secondary | ICD-10-CM | POA: Insufficient documentation

## 2016-10-25 DIAGNOSIS — M6281 Muscle weakness (generalized): Secondary | ICD-10-CM | POA: Insufficient documentation

## 2016-10-25 DIAGNOSIS — I714 Abdominal aortic aneurysm, without rupture, unspecified: Secondary | ICD-10-CM

## 2016-10-25 DIAGNOSIS — R29898 Other symptoms and signs involving the musculoskeletal system: Secondary | ICD-10-CM

## 2016-10-25 DIAGNOSIS — M5442 Lumbago with sciatica, left side: Secondary | ICD-10-CM | POA: Diagnosis not present

## 2016-10-25 DIAGNOSIS — G8929 Other chronic pain: Secondary | ICD-10-CM

## 2016-10-25 DIAGNOSIS — I10 Essential (primary) hypertension: Secondary | ICD-10-CM | POA: Diagnosis not present

## 2016-10-25 DIAGNOSIS — R519 Headache, unspecified: Secondary | ICD-10-CM

## 2016-10-25 LAB — BASIC METABOLIC PANEL
Anion gap: 10 (ref 5–15)
BUN: 16 mg/dL (ref 6–20)
CHLORIDE: 100 mmol/L — AB (ref 101–111)
CO2: 28 mmol/L (ref 22–32)
CREATININE: 1.19 mg/dL (ref 0.61–1.24)
Calcium: 9.2 mg/dL (ref 8.9–10.3)
GFR calc Af Amer: >60 mL/min (ref >60)
Glucose, Bld: 272 mg/dL — ABNORMAL HIGH (ref 65–99)
Potassium: 4.1 mmol/L (ref 3.5–5.1)
SODIUM: 138 mmol/L (ref 135–145)

## 2016-10-25 LAB — URINALYSIS, ROUTINE W REFLEX MICROSCOPIC
Bacteria, UA: NONE SEEN
Bilirubin Urine: NEGATIVE
Hgb urine dipstick: NEGATIVE
KETONES UR: NEGATIVE mg/dL
Leukocytes, UA: NEGATIVE
Nitrite: NEGATIVE
PH: 5 (ref 5.0–8.0)
Protein, ur: NEGATIVE mg/dL
SPECIFIC GRAVITY, URINE: 1.025 (ref 1.005–1.030)

## 2016-10-25 LAB — CBC
HCT: 39.2 % (ref 39.0–52.0)
Hemoglobin: 13 g/dL (ref 13.0–17.0)
MCH: 29.1 pg (ref 26.0–34.0)
MCHC: 33.2 g/dL (ref 30.0–36.0)
MCV: 87.9 fL (ref 78.0–100.0)
PLATELETS: 273 10*3/uL (ref 150–400)
RBC: 4.46 MIL/uL (ref 4.22–5.81)
RDW: 12.8 % (ref 11.5–15.5)
WBC: 6.5 10*3/uL (ref 4.0–10.5)

## 2016-10-25 LAB — CBG MONITORING, ED
Glucose-Capillary: 262 mg/dL — ABNORMAL HIGH (ref 65–99)
Glucose-Capillary: 231 mg/dL — ABNORMAL HIGH (ref 65–99)

## 2016-10-25 MED ORDER — DIPHENHYDRAMINE HCL 50 MG/ML IJ SOLN
12.5000 mg | Freq: Once | INTRAMUSCULAR | Status: AC
  Administered 2016-10-25: 12.5 mg via INTRAVENOUS
  Filled 2016-10-25: qty 1

## 2016-10-25 MED ORDER — PROCHLORPERAZINE EDISYLATE 5 MG/ML IJ SOLN
10.0000 mg | Freq: Once | INTRAMUSCULAR | Status: AC
  Administered 2016-10-25: 10 mg via INTRAVENOUS
  Filled 2016-10-25: qty 2

## 2016-10-25 MED ORDER — LORAZEPAM 2 MG/ML IJ SOLN
1.0000 mg | INTRAMUSCULAR | Status: AC
  Administered 2016-10-25 – 2016-10-26 (×2): 1 mg via INTRAVENOUS
  Filled 2016-10-25 (×2): qty 1

## 2016-10-25 MED ORDER — SODIUM CHLORIDE 0.9 % IV BOLUS (SEPSIS)
1000.0000 mL | Freq: Once | INTRAVENOUS | Status: AC
  Administered 2016-10-25: 1000 mL via INTRAVENOUS

## 2016-10-25 NOTE — ED Notes (Signed)
Checked CBG 262, RN Carla informed

## 2016-10-25 NOTE — ED Triage Notes (Signed)
Onset 4-5 months headaches.  Pt reports his blood sugar is elevated.  Checked BS today 377, Pt stays at [facility] and his insulin has been misplaced, not in refrigerator.  Pt took oral medications.  Pt reports he is weak.

## 2016-10-25 NOTE — ED Notes (Signed)
ED Provider at bedside. 

## 2016-10-25 NOTE — ED Provider Notes (Signed)
[facility] DEPT Provider Note   CSN: [id number] Arrival date & time: 10/25/16  2013     History   Chief Complaint Chief Complaint  Patient presents with  . Headache    HPI Ryan Medina Ryan Medina is a 56 y.o. male with a hx of HTN, HLD, DM and cardiac catheterization, Chronic low back pain after surgery in 1993 presents to the Emergency Department complaining of gradual, persistent, progressively worsening right-sided headache onset yesterday. Patient reports the headache initially began on the left and then moved to the right. No treatments prior to arrival. Patient reports that he has been getting intense headaches for the last 4 months, worse in the morning after waking. Reports headaches come for approximately 20 minutes and then resolve for several hours returning again. He reports that sometimes he has blurred vision with these but does not currently have blurred vision. He denies numbness, weakness, tingling. Patient denies photophobia, phonophobia, nausea, vomiting, diaphoresis.  Patient reports he's been compliant with his hypertension medications and insulin.   Patient reports he's been seen several times for this headache in the emergency department but no one ever treats his headache. He reports they are always more concerned about his diabetes. Record review shows no CT scan of the head is been performed.  The history is provided by the patient and medical records. No language interpreter was used.    Past Medical History:  Diagnosis Date  . Back pain, chronic   . Diabetes ([facility])   . HLD (hyperlipidemia)   . Hypertension   . IDDM (insulin dependent diabetes mellitus) ([place])   . Ruptured lumbar disc     Patient Active Problem List   Diagnosis Date Noted  . Abnormal nuclear stress test 05/19/2016  . Atypical angina ([place]) 04/25/2016  . DM (diabetes mellitus), type 2, uncontrolled ([place]) 03/14/2015  . Essential hypertension 03/14/2015  . Hyperglycemia 01/14/2015    Past  Surgical History:  Procedure Laterality Date  . ABDOMINAL SURGERY    . APPENDECTOMY    . BACK SURGERY    . CARDIAC CATHETERIZATION N/A 05/19/2016   Procedure: Left Heart Cath and Coronary Angiography;  Surgeon: Leonie Man, MD;  Location: [facility];  Service: Cardiovascular;  Laterality: N/A;       Home Medications    Prior to Admission medications   Medication Sig Start Date End Date Taking? Authorizing Provider  amLODipine (NORVASC) 10 MG tablet Take 1 tablet (10 mg total) by mouth daily. 09/23/16  Yes Dorena Dew, FNP  cyclobenzaprine (FLEXERIL) 10 MG tablet Take 1 tablet (10 mg total) by mouth 3 (three) times daily as needed for muscle spasms. 10/13/16  Yes Duffy Bruce, MD  gabapentin (NEURONTIN) 300 MG capsule Take 1 capsule (300 mg total) by mouth at bedtime. 09/23/16  Yes Dorena Dew, FNP  hydrochlorothiazide (HYDRODIURIL) 25 MG tablet Take 1 tablet (25 mg total) by mouth daily. 09/23/16  Yes Dorena Dew, FNP  insulin aspart (NOVOLOG) 100 UNIT/ML injection Take insulin per sliding scale Patient taking differently: Inject 8-10 Units into the skin 3 (three) times daily with meals. Take insulin per sliding scale 09/11/16  Yes Dorena Dew, FNP  insulin detemir (LEVEMIR) 100 UNIT/ML injection INJECT 20 UNITS SUBCUTANEOUSLY 2 TIMES DAILY 09/11/16  Yes Dorena Dew, FNP  meloxicam (MOBIC) 15 MG tablet Take 1 tablet (15 mg total) by mouth daily. 09/23/16  Yes Dorena Dew, FNP  metFORMIN (GLUCOPHAGE) 1000 MG tablet Take 1 tablet (1,000 mg total)  by mouth 2 (two) times daily with a meal. 09/23/16  Yes Dorena Dew, FNP  nitroGLYCERIN (NITROSTAT) 0.4 MG SL tablet Place 1 tablet (0.4 mg total) under the tongue every 5 (five) minutes as needed for chest pain (x 3 doses). 09/23/16  Yes Dorena Dew, FNP  pantoprazole (PROTONIX) 40 MG tablet Take 1 tablet (40 mg total) by mouth daily. 09/23/16  Yes Dorena Dew, FNP  methocarbamol (ROBAXIN) 500 MG tablet  Take 1 tablet (500 mg total) by mouth 2 (two) times daily. 10/26/16   Jarrett Soho Tavia Stave, PA-C    Family History Family History  Problem Relation Age of Onset  . Diabetes Mother   . Heart failure Mother   . Diabetes Sister   . Diabetes Brother     Social History Social History  Substance Use Topics  . Smoking status: Current Some Day Smoker    Packs/day: 0.30    Years: 25.00    Types: Cigarettes  . Smokeless tobacco: Never Used  . Alcohol use No     Allergies   Lisinopril   Review of Systems Review of Systems  Musculoskeletal: Positive for back pain ( low chronic).  Neurological: Positive for headaches.  All other systems reviewed and are negative.    Physical Exam Updated Vital Signs BP 121/88   Pulse 83   Temp 98.1 F (36.7 C) (Oral)   Resp 18   Ht 5\' 11"  (1.803 m)   Wt 92.5 kg   SpO2 100%   BMI 28.45 kg/m   Physical Exam  Constitutional: He is oriented to person, place, and time. He appears well-developed and well-nourished. No distress.  HENT:  Head: Normocephalic and atraumatic.  Mouth/Throat: Oropharynx is clear and moist. No oropharyngeal exudate.  Eyes: Conjunctivae and EOM are normal. Pupils are equal, round, and reactive to light. No scleral icterus.  No horizontal, vertical or rotational nystagmus  Neck: Normal range of motion. Neck supple.  Full active and passive ROM without pain No midline or paraspinal tenderness No nuchal rigidity or meningeal signs  Cardiovascular: Normal rate, regular rhythm and intact distal pulses.   Pulmonary/Chest: Effort normal and breath sounds normal. No respiratory distress. He has no wheezes. He has no rales.  Abdominal: Soft. Bowel sounds are normal. He exhibits no distension. There is no tenderness. There is no rebound and no guarding.  Musculoskeletal: Normal range of motion.  Full range of motion of the T-spine and L-spine No midline tenderness to the  T-spine or L-spine Tenderness to palpation of the  paraspinous muscles of the L-spine Well-healed midline surgical incision to the L-spine  Lymphadenopathy:    He has no cervical adenopathy.  Neurological: He is alert and oriented to person, place, and time. No cranial nerve deficit. He exhibits normal muscle tone. Coordination normal.  Mental Status:  Alert, oriented, thought content appropriate. Speech fluent without evidence of aphasia. Able to follow 2 step commands without difficulty.  Cranial Nerves:  II:  Peripheral visual fields grossly normal, pupils equal, round, reactive to light III,IV, VI: ptosis not present, extra-ocular motions intact bilaterally  V,VII: smile symmetric, facial light touch sensation equal VIII: hearing grossly normal bilaterally  IX,X: midline uvula rise  XI: bilateral shoulder shrug equal and strong XII: midline tongue extension  Motor:  5/5 in upper and lower extremities bilaterally including strong and equal grip strength and dorsiflexion/plantar flexion Sensory: Pinprick and light touch normal in all extremities.  Cerebellar: normal finger-to-nose with bilateral upper extremities Gait: normal gait and  balance CV: distal pulses palpable throughout   Skin: Skin is warm and dry. No rash noted. He is not diaphoretic. No erythema.  Psychiatric: He has a normal mood and affect. His behavior is normal. Judgment and thought content normal.  Nursing note and vitals reviewed.    ED Treatments / Results  Labs (all labs ordered are listed, but only abnormal results are displayed) Labs Reviewed  BASIC METABOLIC PANEL - Abnormal; Notable for the following:       Result Value   Chloride 100 (*)    Glucose, Bld 272 (*)    All other components within normal limits  URINALYSIS, ROUTINE W REFLEX MICROSCOPIC - Abnormal; Notable for the following:    Glucose, UA >=500 (*)    Squamous Epithelial / LPF 0-5 (*)    All other components within normal limits  CBG MONITORING, ED - Abnormal; Notable for the following:      Glucose-Capillary 262 (*)    All other components within normal limits  CBG MONITORING, ED - Abnormal; Notable for the following:    Glucose-Capillary 231 (*)    All other components within normal limits  CBC    EKG  EKG Interpretation None       Radiology Ct Head Wo Contrast  Result Date: 10/25/2016 CLINICAL DATA:  Persistent headache for 4 months. Lightheadedness and blurry vision yesterday. EXAM: CT HEAD WITHOUT CONTRAST TECHNIQUE: Contiguous axial images were obtained from the base of the skull through the vertex without intravenous contrast. COMPARISON:  None. FINDINGS: Brain: There is no intracranial hemorrhage, mass or evidence of acute infarction. There is extensive hypodensity in the subcortical and deep white matter of both cerebral hemispheres. This could represent chronic small vessel ischemic disease but other white matter disease is are possible. This white matter hypodensity is advanced for age. There is no significant extra-axial fluid collection. No acute intracranial findings are evident. Vascular: No hyperdense vessel or unexpected calcification. Skull: Normal. Negative for fracture or focal lesion. Sinuses/Orbits: No acute finding. Other: None. IMPRESSION: Extensive hypodensity in the subcortical and deep white matter of both cerebral hemispheres. This could represent age-advanced small vessel ischemic disease, but other white matter diseases are a possibility. No intracranial hemorrhage or evidence of acute infarction. Electronically Signed   By: Andreas Newport M.D.   On: 10/25/2016 22:06   Mr Jeri Cos And Wo Contrast  Result Date: 10/26/2016 CLINICAL DATA:  Headaches for 4-5 months. LEFT leg weakness. Hyperglycemia. EXAM: MRI HEAD WITHOUT AND WITH CONTRAST TECHNIQUE: Multiplanar, multiecho pulse sequences of the brain and surrounding structures were obtained without and with intravenous contrast. CONTRAST:  50mL MULTIHANCE GADOBENATE DIMEGLUMINE 529 MG/ML IV SOLN  COMPARISON:  CT HEAD October 25, 2016 FINDINGS: Multiple sequences are mildly motion degraded. INTRACRANIAL CONTENTS: No reduced diffusion to suggest acute ischemia. Chronic microhemorrhage LEFT occipital lobe. Patchy to confluent supratentorial white matter FLAIR T2 hyperintensities. No midline shift, mass effect or masses. No abnormal intraparenchymal enhancement. Ventricles and sulci are normal for patient's age. No abnormal extra-axial fluid collection or extra-axial enhancement. VASCULAR: Normal major intracranial vascular flow voids present at skull base. SKULL AND UPPER CERVICAL SPINE: No abnormal sellar expansion. No suspicious calvarial bone marrow signal. Craniocervical junction maintained. RIGHT frontal scalp lipoma. SINUSES/ORBITS: Mild paranasal sinus mucosal thickening. Mastoid air cells are well aerated. The included ocular globes and orbital contents are non-suspicious. OTHER: Partially imaged enlarged parotid glands. IMPRESSION: No acute intracranial process on this mildly motion degraded examination. Moderate to severe white matter changes most  compatible with chronic small vessel ischemic disease. Electronically Signed   By: Elon Alas M.D.   On: 10/26/2016 02:32   Mr Lumbar Spine W Wo Contrast  Result Date: 10/26/2016 CLINICAL DATA:  Headaches for 4-5 months. LEFT leg weakness. Hyperglycemia. EXAM: MRI LUMBAR SPINE WITHOUT AND WITH CONTRAST TECHNIQUE: Multiplanar and multiecho pulse sequences of the lumbar spine were obtained without and with intravenous contrast. CONTRAST:  65mL MULTIHANCE GADOBENATE DIMEGLUMINE 529 MG/ML IV SOLN COMPARISON:  None. FINDINGS: Multiple sequences are moderately motion degraded. SEGMENTATION: For the purposes of this report, the last well-formed intervertebral disc will be described as L5-S1. ALIGNMENT: Maintenance of the lumbar lordosis. Grade 1 L5-S1 retrolisthesis no spondylolysis though limited by motion. VERTEBRAE:Vertebral bodies are intact. Severe  L5-S1 disc height loss, mild L3-4 and L4-5 disc narrowing with decreased T2 signal within the L3-4 through L5-S1 disc compatible with desiccation. Moderate subacute on chronic discogenic endplate changes [id number]. Mild bone marrow edema LEFT L4-5 facet associated with effusion in enhancement, likely and inflammatory. No suspicious osseous or intradiscal enhancement. CONUS MEDULLARIS: Conus medullaris terminates at L1-2 and demonstrates normal morphology and signal characteristics. Central displacement of the cauda equina due to canal stenosis. No abnormal cord or leptomeningeal enhancement. PARASPINAL AND SOFT TISSUES: 3.2 cm infrarenal aortic aneurysm, bilateral Common iliac artery aneurysm, on the RIGHT (new from CT abdomen and pelvis December 28, 2003). DISC LEVELS: T12-L1, L1-2 and L2-3: No disc bulge, canal stenosis nor neural foraminal narrowing. L3-4: Small broad-based disc bulge asymmetric to the RIGHT. Moderate to severe facet arthropathy and ligamentum flavum redundancy with trace facet effusions which are likely reactive. Mild canal stenosis. Mild LEFT greater than RIGHT neural foraminal narrowing. L4-5: 3 mm broad-based disc bulge. Severe facet arthropathy and ligamentum flavum redundancy, trace facet effusions which are likely reactive bilateral subcentimeter L4-5 facet synovial cyst extending into the paraspinal soft tissues. Severe canal stenosis, lateral recess stenosis may affect the traversing L5 nerves. Mild to moderate RIGHT, moderate LEFT neural foraminal narrowing. L5-S1: Retrolisthesis. Annular bulging. Probable old LEFT hemilaminectomy. 5 mm LEFT central disc protrusion contacts the traversing LEFT S1 nerve within the lateral recess. No canal stenosis. Mild RIGHT, mild to moderate LEFT neural foraminal narrowing. IMPRESSION: 1. Grade 1 L4-5 retrolisthesis on degenerative basis. 2. 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 Moderate L5-S1 disc protrusion contacts the traversing LEFT S1 nerve. 3. Neural foraminal  narrowing L3-4 through L5-S1: Moderate on the LEFT at L4-5. 4. Severe L4-5 facet arthropathy, LEFT L4-5 facet bone marrow edema, likely inflammatory. 5. **An incidental finding of potential clinical significance has been found. 3.2 cm infrarenal aortic aneurysm of bilateral Common iliac artery aneurysms. Recommend followup by ultrasound in 3 years. This recommendation follows ACR consensus guidelines: White Paper of the ACR Incidental Findings Committee II on Vascular Findings. Natasha [facility] Coll Radiol [id number]** Electronically Signed   By: Elon Alas M.D.   On: 10/26/2016 02:45    Procedures Procedures (including critical care time)  Medications Ordered in ED Medications  sodium chloride 0.9 % bolus 1,000 mL (0 mLs Intravenous Stopped 10/25/16 2259)  prochlorperazine (COMPAZINE) injection 10 mg (10 mg Intravenous Given 10/25/16 2204)  diphenhydrAMINE (BENADRYL) injection 12.5 mg (12.5 mg Intravenous Given 10/25/16 2202)  LORazepam (ATIVAN) injection 1 mg (1 mg Intravenous Given 10/26/16 0050)  gadobenate dimeglumine (MULTIHANCE) injection 20 mL (20 mLs Intravenous Contrast Given 10/26/16 0207)     Initial Impression / Assessment and Plan / ED Course  I have reviewed the triage vital signs and the nursing notes.  Pertinent labs & imaging results that were available during my care of the patient were reviewed by me and considered in my medical decision making (see chart for details).  Clinical Course as of Oct 26 606  Sat Oct 25, 2016  2308 Discussed with neurology Dr. Cheral Marker who recommends MRI brain and L-spine with and without contrast.  [HM]    Clinical Course User Index [HM] Abigail Butts, PA-C    Patient with headache 4 months. No imaging has been completed. CT scan showed sensitive hypodensities in the subcortical and dense white matter. MRI obtained and showed extensive chronic white matter disease, but no acute findings and no evidence of demyelination. L-spine with stenosis  and likely nerve impingements.  He is ambulatory without difficulty and without weakness here in the emergency department. Will refer him to [facility] neurologic for further evaluation of his headaches. Patient is to follow-up with his primary care provider regarding his back pain and for potential referral.    Of note patient also with small abdominal aortic aneurysm.  Patient will need follow-up with PCP.  A note was sent to his PCP to help him remember to follow-up on this.   Final Clinical Impressions(s) / ED Diagnoses   Final diagnoses:  Left leg weakness  Nonintractable headache, unspecified chronicity pattern, unspecified headache type  Chronic midline low back pain with left-sided sciatica  Abdominal aortic aneurysm (AAA) without rupture [facility])    New Prescriptions Discharge Medication List as of 10/26/2016  4:33 AM    START taking these medications   Details  methocarbamol (ROBAXIN) 500 MG tablet Take 1 tablet (500 mg total) by mouth 2 (two) times daily., Starting Sun 10/26/2016, Print         Riley Papin, PA-C 10/26/16 [id number]    Noemi Chapel, MD 11/05/16 1025

## 2016-10-26 ENCOUNTER — Emergency Department (HOSPITAL_COMMUNITY): Payer: Medicaid Other

## 2016-10-26 MED ORDER — GADOBENATE DIMEGLUMINE 529 MG/ML IV SOLN
20.0000 mL | Freq: Once | INTRAVENOUS | Status: AC | PRN
  Administered 2016-10-26: 20 mL via INTRAVENOUS

## 2016-10-26 MED ORDER — METHOCARBAMOL 500 MG PO TABS: 500.0000 mg | ORAL_TABLET | Freq: Two times a day (BID) | ORAL | 0 refills | Status: DC

## 2016-10-26 NOTE — Discharge Instructions (Signed)
1. Medications: robaxin, usual home medications °2. Treatment: rest, drink plenty of fluids, gentle stretching as discussed, alternate ice and heat °3. Follow Up: Please followup with your primary doctor in 3 days for discussion of your diagnoses and further evaluation after today's visit; if you do not have a primary care doctor use the resource guide provided to find one;  Return to the ER for worsening back pain, difficulty walking, loss of bowel or bladder control or other concerning symptoms ° ° ° °

## 2016-10-26 NOTE — ED Notes (Signed)
Pt transported to MRI 

## 2016-10-26 NOTE — ED Notes (Signed)
RN went in to room to clean after patients discharge and pt was still in bed sleeping; RN awakened pt and asked "is there anything else you need before you leave?"; pt asked for bus ticket

## 2016-10-26 NOTE — ED Notes (Signed)
Pt returned from MRI and connected to the monitor 

## 2016-10-27 ENCOUNTER — Emergency Department (HOSPITAL_COMMUNITY): Payer: Medicaid Other

## 2016-10-27 ENCOUNTER — Encounter (HOSPITAL_COMMUNITY): Payer: Self-pay

## 2016-10-27 DIAGNOSIS — I10 Essential (primary) hypertension: Secondary | ICD-10-CM | POA: Insufficient documentation

## 2016-10-27 DIAGNOSIS — Z794 Long term (current) use of insulin: Secondary | ICD-10-CM | POA: Insufficient documentation

## 2016-10-27 DIAGNOSIS — R0602 Shortness of breath: Secondary | ICD-10-CM | POA: Diagnosis present

## 2016-10-27 DIAGNOSIS — E119 Type 2 diabetes mellitus without complications: Secondary | ICD-10-CM | POA: Insufficient documentation

## 2016-10-27 DIAGNOSIS — Z955 Presence of coronary angioplasty implant and graft: Secondary | ICD-10-CM | POA: Diagnosis not present

## 2016-10-27 DIAGNOSIS — J45901 Unspecified asthma with (acute) exacerbation: Secondary | ICD-10-CM | POA: Insufficient documentation

## 2016-10-27 DIAGNOSIS — Z79899 Other long term (current) drug therapy: Secondary | ICD-10-CM | POA: Diagnosis not present

## 2016-10-27 DIAGNOSIS — F17210 Nicotine dependence, cigarettes, uncomplicated: Secondary | ICD-10-CM | POA: Insufficient documentation

## 2016-10-27 LAB — CBC
HCT: 39.4 % (ref 39.0–52.0)
Hemoglobin: 13.5 g/dL (ref 13.0–17.0)
MCH: 29.5 pg (ref 26.0–34.0)
MCHC: 34.3 g/dL (ref 30.0–36.0)
MCV: 86.2 fL (ref 78.0–100.0)
Platelets: 282 10*3/uL (ref 150–400)
RBC: 4.57 MIL/uL (ref 4.22–5.81)
RDW: 12.8 % (ref 11.5–15.5)
WBC: 6.7 10*3/uL (ref 4.0–10.5)

## 2016-10-27 MED ORDER — ALBUTEROL SULFATE (2.5 MG/3ML) 0.083% IN NEBU
INHALATION_SOLUTION | RESPIRATORY_TRACT | Status: AC
  Filled 2016-10-27: qty 6

## 2016-10-27 MED ORDER — ALBUTEROL SULFATE (2.5 MG/3ML) 0.083% IN NEBU
5.0000 mg | INHALATION_SOLUTION | Freq: Once | RESPIRATORY_TRACT | Status: AC
  Administered 2016-10-27: 5 mg via RESPIRATORY_TRACT

## 2016-10-27 NOTE — ED Triage Notes (Addendum)
Pt endorses shortness of breath that began 1 hour PTA while sitting down. Denies cardiac hx. Also endorse "Pain down my whole left side for months" VSS. Breath sounds clear.

## 2016-10-28 ENCOUNTER — Emergency Department (HOSPITAL_COMMUNITY)
Admission: EM | Admit: 2016-10-28 | Discharge: 2016-10-28 | Disposition: A | Discharge status: Home / Self Care | Payer: Medicaid Other | Attending: Emergency Medicine | Admitting: Emergency Medicine

## 2016-10-28 DIAGNOSIS — J45901 Unspecified asthma with (acute) exacerbation: Secondary | ICD-10-CM

## 2016-10-28 LAB — BASIC METABOLIC PANEL
Anion gap: 10 (ref 5–15)
BUN: 20 mg/dL (ref 6–20)
CALCIUM: 9.7 mg/dL (ref 8.9–10.3)
CO2: 27 mmol/L (ref 22–32)
CREATININE: 1.1 mg/dL (ref 0.61–1.24)
Chloride: 100 mmol/L — ABNORMAL LOW (ref 101–111)
Glucose, Bld: 292 mg/dL — ABNORMAL HIGH (ref 65–99)
Potassium: 3.7 mmol/L (ref 3.5–5.1)
SODIUM: 137 mmol/L (ref 135–145)

## 2016-10-28 LAB — TROPONIN I

## 2016-10-28 MED ORDER — ALBUTEROL SULFATE HFA 108 (90 BASE) MCG/ACT IN AERS
1.0000 | INHALATION_SPRAY | RESPIRATORY_TRACT | Status: DC | PRN
  Administered 2016-10-28: 2 via RESPIRATORY_TRACT
  Filled 2016-10-28: qty 6.7

## 2016-10-28 NOTE — ED Provider Notes (Signed)
[facility] DE[facility] Provider Note   CSN: [id number] Arrival date & time: 10/27/16  2318     History   Chief Complaint Chief Complaint  Patient presents with  . Shortness of Breath  . side pain    HPI Ryan Medina is a 56 y.o. male.  HPI   56 year old male presents today with complaints of shortness of breath. Patient notes for the last 2 years he's had off and on shortness of breath. He notes that it came again last night with tightness in the chest. Patient notes symptoms are worse with smoking. He denies any chest pain, dizziness, lightheadedness, abdominal pain, lower extremity swelling or edema, history of DVT or PE. Patient has prior to my evaluation received a breathing treatment which completely resolved his symptoms. He denies any recent illnesses.    Past Medical History:  Diagnosis Date  . Back pain, chronic   . Diabetes ([place])   . HLD (hyperlipidemia)   . Hypertension   . IDDM (insulin dependent diabetes mellitus) ([place])   . Ruptured lumbar disc     Patient Active Problem List   Diagnosis Date Noted  . Abnormal nuclear stress test 05/19/2016  . Atypical angina ([place]) 04/25/2016  . DM (diabetes mellitus), type 2, uncontrolled ([place]) 03/14/2015  . Essential hypertension 03/14/2015  . Hyperglycemia 01/14/2015    Past Surgical History:  Procedure Laterality Date  . ABDOMINAL SURGERY    . APPENDECTOMY    . BACK SURGERY    . CARDIAC CATHETERIZATION N/A 05/19/2016   Procedure: Left Heart Cath and Coronary Angiography;  Surgeon: Leonie Man, MD;  Location: [facility];  Service: Cardiovascular;  Laterality: N/A;       Home Medications    Prior to Admission medications   Medication Sig Start Date End Date Taking? Authorizing Provider  amLODipine (NORVASC) 10 MG tablet Take 1 tablet (10 mg total) by mouth daily. 09/23/16   Dorena Dew, FNP  cyclobenzaprine (FLEXERIL) 10 MG tablet Take 1 tablet (10 mg total) by mouth 3 (three) times daily as  needed for muscle spasms. 10/13/16   Duffy Bruce, MD  gabapentin (NEURONTIN) 300 MG capsule Take 1 capsule (300 mg total) by mouth at bedtime. 09/23/16   Dorena Dew, FNP  hydrochlorothiazide (HYDRODIURIL) 25 MG tablet Take 1 tablet (25 mg total) by mouth daily. 09/23/16   Dorena Dew, FNP  insulin aspart (NOVOLOG) 100 UNIT/ML injection Take insulin per sliding scale Patient taking differently: Inject 8-10 Units into the skin 3 (three) times daily with meals. Take insulin per sliding scale 09/11/16   Dorena Dew, FNP  insulin detemir (LEVEMIR) 100 UNIT/ML injection INJECT 20 UNITS SUBCUTANEOUSLY 2 TIMES DAILY 09/11/16   Dorena Dew, FNP  meloxicam (MOBIC) 15 MG tablet Take 1 tablet (15 mg total) by mouth daily. 09/23/16   Dorena Dew, FNP  metFORMIN (GLUCOPHAGE) 1000 MG tablet Take 1 tablet (1,000 mg total) by mouth 2 (two) times daily with a meal. 09/23/16   Dorena Dew, FNP  methocarbamol (ROBAXIN) 500 MG tablet Take 1 tablet (500 mg total) by mouth 2 (two) times daily. 10/26/16   Hannah Muthersbaugh, PA-C  nitroGLYCERIN (NITROSTAT) 0.4 MG SL tablet Place 1 tablet (0.4 mg total) under the tongue every 5 (five) minutes as needed for chest pain (x 3 doses). 09/23/16   Dorena Dew, FNP  pantoprazole (PROTONIX) 40 MG tablet Take 1 tablet (40 mg total) by mouth daily. 09/23/16   Dorena Dew,  FNP    Family History Family History  Problem Relation Age of Onset  . Diabetes Mother   . Heart failure Mother   . Diabetes Sister   . Diabetes Brother     Social History Social History  Substance Use Topics  . Smoking status: Current Some Day Smoker    Packs/day: 0.30    Years: 25.00    Types: Cigarettes  . Smokeless tobacco: Never Used  . Alcohol use No     Allergies   Lisinopril   Review of Systems Review of Systems  All other systems reviewed and are negative.   Physical Exam Updated Vital Signs BP 135/87   Pulse 81   Temp 97.9 F (36.6 C) (Oral)    Resp 14   Ht 5\' 11"  (1.803 m)   Wt 92.5 kg   SpO2 97%   BMI 28.45 kg/m   Physical Exam  Constitutional: He is oriented to person, place, and time. He appears well-developed and well-nourished.  HENT:  Head: Normocephalic and atraumatic.  Eyes: Conjunctivae are normal. Pupils are equal, round, and reactive to light. Right eye exhibits no discharge. Left eye exhibits no discharge. No scleral icterus.  Neck: Normal range of motion. No JVD present. No tracheal deviation present.  Pulmonary/Chest: Effort normal and breath sounds normal. No stridor. No respiratory distress. He has no wheezes. He has no rales. He exhibits no tenderness.  Neurological: He is alert and oriented to person, place, and time. Coordination normal.  Psychiatric: He has a normal mood and affect. His behavior is normal. Judgment and thought content normal.  Nursing note and vitals reviewed.   ED Treatments / Results  Labs (all labs ordered are listed, but only abnormal results are displayed) Labs Reviewed  BASIC METABOLIC PANEL - Abnormal; Notable for the following:       Result Value   Chloride 100 (*)    Glucose, Bld 292 (*)    All other components within normal limits  CBC  TROPONIN I    EKG  EKG Interpretation None       Radiology Dg Chest 2 View  Result Date: 10/28/2016 CLINICAL DATA:  Sudden onset shortness of breath tonight. Previous episodes of shortness of breath on off for 2 years. History of hypertension, diabetes, current smoker. EXAM: CHEST  2 VIEW COMPARISON:  CT chest 10/14/2016.  Chest 10/13/2016. FINDINGS: Linear scarring in the left lung base. Normal heart size and pulmonary vascularity. No focal airspace disease or consolidation in the lungs. No blunting of costophrenic angles. No pneumothorax. Mediastinal contours appear intact. Tortuous aorta. IMPRESSION: No active cardiopulmonary disease. Electronically Signed   By: Lucienne Capers M.D.   On: 10/28/2016 00:05     Procedures Procedures (including critical care time)  Medications Ordered in ED Medications  albuterol (PROVENTIL) (2.5 MG/3ML) 0.083% nebulizer solution (  Canceled Entry 10/28/16 0112)  albuterol (PROVENTIL HFA;VENTOLIN HFA) 108 (90 Base) MCG/ACT inhaler 1-2 puff (2 puffs Inhalation Given 10/28/16 0619)  albuterol (PROVENTIL) (2.5 MG/3ML) 0.083% nebulizer solution 5 mg (5 mg Nebulization Given 10/27/16 2334)     Initial Impression / Assessment and Plan / ED Course  I have reviewed the triage vital signs and the nursing notes.  Pertinent labs & imaging results that were available during my care of the patient were reviewed by me and considered in my medical decision making (see chart for details).       Final Clinical Impressions(s) / ED Diagnoses   Final diagnoses:  Exacerbation of asthma,  unspecified asthma severity, unspecified whether persistent   Labs: CBC, BMP, troponin,  Imaging: EKG, DG chest  Consults:  Therapeutics: Albuterol  Discharge Meds:   Assessment/Plan: 56 year old male presents today with likely asthma exacerbation. Symptoms completely resolved with breathing treatment prior to my evaluation. Patient with no complaints throughout the evaluation. He was discharged home with inhaler, primary care follow-up and return cautions. He verbalized understanding and agreement for today's plan had no further questions or concerns without discharge      New Prescriptions Discharge Medication List as of 10/28/2016  6:43 AM       Okey Regal, PA-C 10/28/16 [place], MD 10/28/16 [phone]

## 2016-10-28 NOTE — ED Notes (Signed)
Pt verbalized understanding of d/c instructions and has no further questions. Pt is stable, A&Ox4, VSS.  

## 2016-10-28 NOTE — Discharge Instructions (Signed)
Please read attached information. If you experience any new or worsening signs or symptoms please return to the emergency room for evaluation. Please follow-up with your primary care provider or specialist as discussed. Please use medication prescribed only as directed and discontinue taking if you have any concerning signs or symptoms.   °

## 2016-11-07 ENCOUNTER — Ambulatory Visit (INDEPENDENT_AMBULATORY_CARE_PROVIDER_SITE_OTHER): Payer: Medicaid Other | Admitting: Family Medicine

## 2016-11-07 ENCOUNTER — Encounter: Payer: Self-pay | Admitting: Family Medicine

## 2016-11-07 VITALS — BP 126/92 | Temp 98.3°F | Resp 18 | Ht 71.0 in | Wt 200.8 lb

## 2016-11-07 DIAGNOSIS — I714 Abdominal aortic aneurysm, without rupture: Secondary | ICD-10-CM

## 2016-11-07 DIAGNOSIS — I10 Essential (primary) hypertension: Secondary | ICD-10-CM

## 2016-11-07 DIAGNOSIS — E1111 Type 2 diabetes mellitus with ketoacidosis with coma: Secondary | ICD-10-CM

## 2016-11-07 DIAGNOSIS — R269 Unspecified abnormalities of gait and mobility: Secondary | ICD-10-CM

## 2016-11-07 DIAGNOSIS — I7143 Infrarenal abdominal aortic aneurysm, without rupture: Secondary | ICD-10-CM

## 2016-11-07 DIAGNOSIS — I2089 Other forms of angina pectoris: Secondary | ICD-10-CM

## 2016-11-07 DIAGNOSIS — Z794 Long term (current) use of insulin: Secondary | ICD-10-CM

## 2016-11-07 DIAGNOSIS — I208 Other forms of angina pectoris: Secondary | ICD-10-CM | POA: Diagnosis not present

## 2016-11-07 LAB — LIPID PANEL
Cholesterol: 210 mg/dL — ABNORMAL HIGH (ref <200)
HDL: 47 mg/dL (ref >40)
LDL CALC: 148 mg/dL — AB (ref <100)
Total CHOL/HDL Ratio: 4.5 Ratio (ref <5.0)
Triglycerides: 77 mg/dL (ref <150)
VLDL: 15 mg/dL (ref <30)

## 2016-11-07 LAB — POCT URINALYSIS DIP (DEVICE)
Bilirubin Urine: NEGATIVE
GLUCOSE, UA: 500 mg/dL — AB
Hgb urine dipstick: NEGATIVE
KETONES UR: NEGATIVE mg/dL
Leukocytes, UA: NEGATIVE
Nitrite: NEGATIVE
PH: 7 (ref 5.0–8.0)
PROTEIN: NEGATIVE mg/dL
SPECIFIC GRAVITY, URINE: 1.015 (ref 1.005–1.030)
UROBILINOGEN UA: 1 mg/dL (ref 0.0–1.0)

## 2016-11-07 LAB — GLUCOSE, CAPILLARY: GLUCOSE-CAPILLARY: 330 mg/dL — AB (ref 65–99)

## 2016-11-07 MED ORDER — ATORVASTATIN CALCIUM 20 MG PO TABS: 20.0000 mg | ORAL_TABLET | Freq: Every day | ORAL | 3 refills | Status: DC

## 2016-11-07 MED ORDER — ASPIRIN EC 81 MG PO TBEC: 81.0000 mg | DELAYED_RELEASE_TABLET | Freq: Every day | ORAL | 11 refills | Status: DC

## 2016-11-07 MED ORDER — GLUCOSE BLOOD VI STRP: ORAL_STRIP | 12 refills | Status: DC

## 2016-11-07 NOTE — Progress Notes (Signed)
Subjective:    Patient ID: Ryan Medina, male    DOB: 04/12/1961, 56 y.o.   MRN: [id number]  HPI Mr. Skylan Hulgan, a 56 year old male with a history of uncontrolled diabetes and hypertension presents for a follow up of chronic conditions. He says that he has not been taking antidiabetic medications consistently. He says that he has run out of testing strips and has not been using sliding scale insulin for greater than 1 week.  He states that blood sugars have been consistently elevated. He is unable to follow a carbohydrate modified diet due to the fact that he a resident of the [facility] and all meals are planned. Patient denies foot ulcerations, hyperglycemia, nausea, paresthesia of the feet, polydipsia, polyuria, visual disturbances, vomitting and weight loss.  Patient also has a history of hypertension. Mar[place] Kitchen He is not exercising and is not adherent to low salt diet. Mr. Enerson does not check blood pressures at home. He says that he has been experiencing periodic chest pains. Pains are primarily to left chest and does not radiate. He was evaluated in the emergency 10/28/2016 for chest pain and shortness of breath. He was prescribed Nitrostat tabs and chest pain decreases following Nitrostat. Patient had an MR of the lumbar spine. There was an incidental finding of potential clinical significance. 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 Patient currently denies chest pain, chest pressure/discomfort, fatigue, orthopnea, palpitations, syncope and tachypnea.  Cardiovascular risk factors: obesity (BMI >= 30 kg/m2), sedentary lifestyle and smoking/ tobacco exposure.  Social History   Social History  . Marital status: Single    Spouse name: N/A  . Number of children: N/A  . Years of education: N/A   Occupational History  . Not on file.   Social History Main Topics  . Smoking status: Current Some Day Smoker    Packs/day: 1.00    Years: 25.00   Types: Cigarettes  . Smokeless tobacco: Never Used  . Alcohol use No  . Drug use: No  . Sexual activity: Not on file   Other Topics Concern  . Not on file   Social History Narrative  . No narrative on file   Immunization History  Administered Date(s) Administered  . Influenza,inj,Quad PF,36+ Mos 07/22/2016  . Pneumococcal Polysaccharide-23 08/29/2016  . Tdap 08/29/2016   Review of Systems  Constitutional: Negative for fatigue.  HENT: Negative.   Eyes: Negative for photophobia and visual disturbance.  Respiratory: Negative.   Cardiovascular: Positive for chest pain.  Gastrointestinal: Negative.   Endocrine: Positive for polydipsia. Negative for polyuria.  Genitourinary: Negative.   Musculoskeletal: Negative.   Skin: Negative.   Allergic/Immunologic: Negative for immunocompromised state.  Neurological: Negative.   Hematological: Negative.   Psychiatric/Behavioral: Negative.        Objective:   Physical Exam  Constitutional: He is oriented to person, place, and time. He appears well-developed and well-nourished.  HENT:  Head: Normocephalic and atraumatic.  Right Ear: External ear normal.  Left Ear: External ear normal.  Nose: Nose normal.  Mouth/Throat: Oropharynx is clear and moist.  Eyes: Conjunctivae and EOM are normal. Pupils are equal, round, and reactive to light.  Neck: Normal range of motion. Neck supple.  Cardiovascular: Normal rate, regular rhythm, normal heart sounds and intact distal pulses.   Pulmonary/Chest: Effort normal and breath sounds normal.  Abdominal: Soft. Bowel sounds are normal.  Musculoskeletal:       Left knee: He exhibits decreased range  of motion. He exhibits no swelling and no erythema.  Neurological: He is alert and oriented to person, place, and time. He has normal reflexes. Gait abnormal.  Skin:  Lipoma to left forehead and right arm. Raised, round, non tender to palpation.   Psychiatric: He has a normal mood and affect. His  behavior is normal. Judgment and thought content normal.      BP (!) 126/92 (BP Location: Left Arm, Patient Position: Sitting)   Temp 98.3 F (36.8 C) (Oral)   Resp 18   Ht 5\' 11"  (1.803 m)   Wt 200 lb 12.8 oz (91.1 kg)   SpO2 100%   BMI 28.01 kg/m  Assessment & Plan:   1. Essential hypertension Blood pressure is at goal on current medication regimen.  - Microalbumin/Creatinine Ratio, Urine - Lipid Panel  2. Uncontrolled type 2 diabetes mellitus with ketoacidotic coma, with long-term current use of insulin (HCC) - glucose blood (AGAMATRIX PRESTO TEST) test strip; Check blood sugars prior to meals and at bedtime  Dispense: 100 each; Refill: 12 - Ambulatory referral to Ophthalmology - Lipid Panel <150     = 0 units 150-199= 2 units 200-249= 4 units 250-299= 6 units 300-349= 8 units >350 = 10 units   3. Abnormality of gait and mobility Will send order for adjustable can to assist with ambulation.  - DME Other see comment  4. Atypical angina ([place]) Reviewed previous EKG, abnormal. Will send a referral to cardiology. Continue Nitrostat tabs as previously prescribed.  - Ambulatory referral to Cardiology  5. Aneurysm of infrarenal abdominal aorta (HCC) Asymptomatic infrarenal aneurysm < 5.5 cm, recommend conservative management. Will aim at reducing cardiovascular risks. Will start aspirin and statin therapy. Will send a referral to vein and vascular for further surveillance.  - aspirin EC 81 MG tablet; Take 1 tablet (81 mg total) by mouth daily.  Dispense: 30 tablet; Refill: 11 - atorvastatin (LIPITOR) 20 MG tablet; Take 1 tablet (20 mg total) by mouth daily.  Dispense: 90 tablet; Refill: 3        RTC: 1 month for DMII  Patient will transition to the day infusion center for a saline bolus.   The patient was given clear instructions to go to ER or return to medical center if symptoms do not improve, worsen or new problems develop. The patient verbalized understanding.  Will notify patient with laboratory results.  Dorena Dew, FNP

## 2016-11-07 NOTE — Patient Instructions (Addendum)
Sent referral to cardiology for atypical chest pain  Sent order for cane to [facility] [address] M-F [id number] Sat. 10-1   Diabetes and Foot Care Diabetes may cause you to have problems because of poor blood supply (circulation) to your feet and legs. This may cause the skin on your feet to become thinner, break easier, and heal more slowly. Your skin may become dry, and the skin may peel and crack. You may also have nerve damage in your legs and feet causing decreased feeling in them. You may not notice minor injuries to your feet that could lead to infections or more serious problems. Taking care of your feet is one of the most important things you can do for yourself. Follow these instructions at home:  Wear shoes at all times, even in the house. Do not go barefoot. Bare feet are easily injured.  Check your feet daily for blisters, cuts, and redness. If you cannot see the bottom of your feet, use a mirror or ask someone for help.  Wash your feet with warm water (do not use hot water) and mild soap. Then pat your feet and the areas between your toes until they are completely dry. Do not soak your feet as this can dry your skin.  Apply a moisturizing lotion or petroleum jelly (that does not contain alcohol and is unscented) to the skin on your feet and to dry, brittle toenails. Do not apply lotion between your toes.  Trim your toenails straight across. Do not dig under them or around the cuticle. File the edges of your nails with an emery board or nail file.  Do not cut corns or calluses or try to remove them with medicine.  Wear clean socks or stockings every day. Make sure they are not too tight. Do not wear knee-high stockings since they may decrease blood flow to your legs.  Wear shoes that fit properly and have enough cushioning. To break in new shoes, wear them for just a few hours a day. This prevents you from injuring your feet. Always look in your shoes before  you put them on to be sure there are no objects inside.  Do not cross your legs. This may decrease the blood flow to your feet.  If you find a minor scrape, cut, or break in the skin on your feet, keep it and the skin around it clean and dry. These areas may be cleansed with mild soap and water. Do not cleanse the area with peroxide, alcohol, or iodine.  When you remove an adhesive bandage, be sure not to damage the skin around it.  If you have a wound, look at it several times a day to make sure it is healing.  Do not use heating pads or hot water bottles. They may burn your skin. If you have lost feeling in your feet or legs, you may not know it is happening until it is too late.  Make sure your health care provider performs a complete foot exam at least annually or more often if you have foot problems. Report any cuts, sores, or bruises to your health care provider immediately. Contact a health care provider if:  You have an injury that is not healing.  You have cuts or breaks in the skin.  You have an ingrown nail.  You notice redness on your legs or feet.  You feel burning or tingling in your legs or feet.  You have pain or cramps in  your legs and feet.  Your legs or feet are numb.  Your feet always feel cold. Get help right away if:  There is increasing redness, swelling, or pain in or around a wound.  There is a red line that goes up your leg.  Pus is coming from a wound.  You develop a fever or as directed by your health care provider.  You notice a bad smell coming from an ulcer or wound. This information is not intended to replace advice given to you by your health care provider. Make sure you discuss any questions you have with your health care provider. Document Released: 09/05/2000 Document Revised: 02/14/2016 Document Reviewed: 02/15/2013 Elsevier Interactive Patient Education  2017 [employer]. Diabetes Mellitus and Food It is important for you to manage  your blood sugar (glucose) level. Your blood glucose level can be greatly affected by what you eat. Eating healthier foods in the appropriate amounts throughout the day at about the same time each day will help you control your blood glucose level. It can also help slow or prevent worsening of your diabetes mellitus. Healthy eating may even help you improve the level of your blood pressure and reach or maintain a healthy weight. General recommendations for healthful eating and cooking habits include:  Eating meals and snacks regularly. Avoid going long periods of time without eating to lose weight.  Eating a diet that consists mainly of plant-based foods, such as fruits, vegetables, nuts, legumes, and whole grains.  Using low-heat cooking methods, such as baking, instead of high-heat cooking methods, such as deep frying. Work with your dietitian to make sure you understand how to use the Nutrition Facts information on food labels. How can food affect me? Carbohydrates  Carbohydrates affect your blood glucose level more than any other type of food. Your dietitian will help you determine how many carbohydrates to eat at each meal and teach you how to count carbohydrates. Counting carbohydrates is important to keep your blood glucose at a healthy level, especially if you are using insulin or taking certain medicines for diabetes mellitus. Alcohol  Alcohol can cause sudden decreases in blood glucose (hypoglycemia), especially if you use insulin or take certain medicines for diabetes mellitus. Hypoglycemia can be a life-threatening condition. Symptoms of hypoglycemia (sleepiness, dizziness, and disorientation) are similar to symptoms of having too much alcohol. If your health care provider has given you approval to drink alcohol, do so in moderation and use the following guidelines:  Women should not have more than one drink per day, and men should not have more than two drinks per day. One drink is equal  to:  12 oz of beer.  5 oz of wine.  1 oz of hard liquor.  Do not drink on an empty stomach.  Keep yourself hydrated. Have water, diet soda, or unsweetened iced tea.  Regular soda, juice, and other mixers might contain a lot of carbohydrates and should be counted. What foods are not recommended? As you make food choices, it is important to remember that all foods are not the same. Some foods have fewer nutrients per serving than other foods, even though they might have the same number of calories or carbohydrates. It is difficult to get your body what it needs when you eat foods with fewer nutrients. Examples of foods that you should avoid that are high in calories and carbohydrates but low in nutrients include:  Trans fats (most processed foods list trans fats on the Nutrition Facts label).  Regular soda.  Juice.  Candy.  Sweets, such as cake, pie, doughnuts, and cookies.  Fried foods. What foods can I eat? Eat nutrient-rich foods, which will nourish your body and keep you healthy. The food you should eat also will depend on several factors, including:  The calories you need.  The medicines you take.  Your weight.  Your blood glucose level.  Your blood pressure level.  Your cholesterol level. You should eat a variety of foods, including:  Protein.  Lean cuts of meat.  Proteins low in saturated fats, such as fish, egg whites, and beans. Avoid processed meats.  Fruits and vegetables.  Fruits and vegetables that may help control blood glucose levels, such as apples, mangoes, and yams.  Dairy products.  Choose fat-free or low-fat dairy products, such as milk, yogurt, and cheese.  Grains, bread, pasta, and rice.  Choose whole grain products, such as multigrain bread, whole oats, and brown rice. These foods may help control blood pressure.  Fats.  Foods containing healthful fats, such as nuts, avocado, olive oil, canola oil, and fish. Does everyone with  diabetes mellitus have the same meal plan? Because every person with diabetes mellitus is different, there is not one meal plan that works for everyone. It is very important that you meet with a dietitian who will help you create a meal plan that is just right for you. This information is not intended to replace advice given to you by your health care provider. Make sure you discuss any questions you have with your health care provider. Document Released: 06/05/2005 Document Revised: 02/14/2016 Document Reviewed: 08/05/2013 Elsevier Interactive Patient Education  2017 [place].  Chest Wall Pain Introduction Chest wall pain is pain in or around the bones and muscles of your chest. Sometimes, an injury causes this pain. Sometimes, the cause may not be known. This pain may take several weeks or longer to get better. Follow these instructions at home: Pay attention to any changes in your symptoms. Take these actions to help with your pain:  Rest as told by your doctor.  Avoid activities that cause pain. Try not to use your chest, belly (abdominal), or side muscles to lift heavy things.  If directed, apply ice to the painful area:  Put ice in a plastic bag.  Place a towel between your skin and the bag.  Leave the ice on for 20 minutes, 2-3 times per day.  Take over-the-counter and prescription medicines only as told by your doctor.  Do not use tobacco products, including cigarettes, chewing tobacco, and e-cigarettes. If you need help quitting, ask your doctor.  Keep all follow-up visits as told by your doctor. This is important. Contact a doctor if:  You have a fever.  Your chest pain gets worse.  You have new symptoms. Get help right away if:  You feel sick to your stomach (nauseous) or you throw up (vomit).  You feel sweaty or light-headed.  You have a cough with phlegm (sputum) or you cough up blood.  You are short of breath. This information is not intended to replace  advice given to you by your health care provider. Make sure you discuss any questions you have with your health care provider. Document Released: 02/25/2008 Document Revised: 02/14/2016 Document Reviewed: 12/04/2014  2017 Elsevier  Nonspecific Chest Pain Chest pain can be caused by many different conditions. There is always a chance that your pain could be related to something serious, such as a heart attack or a  blood clot in your lungs. Chest pain can also be caused by conditions that are not life-threatening. If you have chest pain, it is very important to follow up with your health care provider. What are the causes? Causes of this condition include:  Heartburn.  Pneumonia or bronchitis.  Anxiety or stress.  Inflammation around your heart (pericarditis) or lung (pleuritis or pleurisy).  A blood clot in your lung.  A collapsed lung (pneumothorax). This can develop suddenly on its own (spontaneous pneumothorax) or from trauma to the chest.  Shingles infection (varicella-zoster virus).  Heart attack.  Damage to the bones, muscles, and cartilage that make up your chest wall. This can include:  Bruised bones due to injury.  Strained muscles or cartilage due to frequent or repeated coughing or overwork.  Fracture to one or more ribs.  Sore cartilage due to inflammation (costochondritis). What increases the risk? Risk factors for this condition may include:  Activities that increase your risk for trauma or injury to your chest.  Respiratory infections or conditions that cause frequent coughing.  Medical conditions or overeating that can cause heartburn.  Heart disease or family history of heart disease.  Conditions or health behaviors that increase your risk of developing a blood clot.  Having had chicken pox (varicella zoster). What are the signs or symptoms? Chest pain can feel like:  Burning or tingling on the surface of your chest or deep in your  chest.  Crushing, pressure, aching, or squeezing pain.  Dull or sharp pain that is worse when you move, cough, or take a deep breath.  Pain that is also felt in your back, neck, shoulder, or arm, or pain that spreads to any of these areas. Your chest pain may come and go, or it may stay constant. How is this diagnosed? Lab tests or other studies may be needed to find the cause of your pain. Your health care provider may have you take a test called an ECG (electrocardiogram). An ECG records your heartbeat patterns at the time the test is performed. You may also have other tests, such as:  Transthoracic echocardiogram (TTE). In this test, sound waves are used to create a picture of the heart structures and to look at how blood flows through your heart.  Transesophageal echocardiogram (TEE).This is a more advanced imaging test that takes images from inside your body. It allows your health care provider to see your heart in finer detail.  Cardiac monitoring. This allows your health care provider to monitor your heart rate and rhythm in real time.  Holter monitor. This is a portable device that records your heartbeat and can help to diagnose abnormal heartbeats. It allows your health care provider to track your heart activity for several days, if needed.  Stress tests. These can be done through exercise or by taking medicine that makes your heart beat more quickly.  Blood tests.  Other imaging tests. How is this treated? Treatment depends on what is causing your chest pain. Treatment may include:  Medicines. These may include:  Acid blockers for heartburn.  Anti-inflammatory medicine.  Pain medicine for inflammatory conditions.  Antibiotic medicine, if an infection is present.  Medicines to dissolve blood clots.  Medicines to treat coronary artery disease (CAD).  Supportive care for conditions that do not require medicines. This may include:  Resting.  Applying heat or cold  packs to injured areas.  Limiting activities until pain decreases. Follow these instructions at home: Medicines  If you were prescribed an  antibiotic, take it as told by your health care provider. Do not stop taking the antibiotic even if you start to feel better.  Take over-the-counter and prescription medicines only as told by your health care provider. Lifestyle  Do not use any products that contain nicotine or tobacco, such as cigarettes and e-cigarettes. If you need help quitting, ask your health care provider.  Do not drink alcohol.  Make lifestyle changes as directed by your health care provider. These may include:  Getting regular exercise. Ask your health care provider to suggest some activities that are safe for you.  Eating a heart-healthy diet. A registered dietitian can help you to learn healthy eating options.  Maintaining a healthy weight.  Managing diabetes, if necessary.  Reducing stress, such as with yoga or relaxation techniques. General instructions  Avoid any activities that bring on chest pain.  If heartburn is the cause for your chest pain, raise (elevate) the head of your bed about 6 inches (15 cm) by putting blocks under the legs. Sleeping with more pillows does not effectively relieve heartburn because it only changes the position of your head.  Keep all follow-up visits as told by your health care provider. This is important. This includes any further testing if your chest pain does not go away. Contact a health care provider if:  Your chest pain does not go away.  You have a rash with blisters on your chest.  You have a fever.  You have chills. Get help right away if:  Your chest pain is worse.  You have a cough that gets worse, or you cough up blood.  You have severe pain in your abdomen.  You have severe weakness.  You faint.  You have sudden, unexplained chest discomfort.  You have sudden, unexplained discomfort in your arms,  back, neck, or jaw.  You have shortness of breath at any time.  You suddenly start to sweat, or your skin gets clammy.  You feel nauseous or you vomit.  You suddenly feel light-headed or dizzy.  Your heart begins to beat quickly, or it feels like it is skipping beats. These symptoms may represent a serious problem that is an emergency. Do not wait to see if the symptoms will go away. Get medical help right away. Call your local emergency services (911 in the [place] Do not drive yourself to the hospital.  This information is not intended to replace advice given to you by your health care provider. Make sure you discuss any questions you have with your health care provider. Document Released: 06/18/2005 Document Revised: 06/02/2016 Document Reviewed: 06/02/2016 Elsevier Interactive Patient Education  2017 [employer].

## 2016-11-08 LAB — MICROALBUMIN / CREATININE URINE RATIO
CREATININE, URINE: 87 mg/dL (ref 20–370)
Microalb Creat Ratio: 51 mcg/mg creat — ABNORMAL HIGH (ref <30)
Microalb, Ur: 4.4 mg/dL

## 2016-11-10 ENCOUNTER — Other Ambulatory Visit: Payer: Self-pay | Admitting: Family Medicine

## 2016-11-11 DIAGNOSIS — E7849 Other hyperlipidemia: Secondary | ICD-10-CM | POA: Insufficient documentation

## 2016-11-11 NOTE — Progress Notes (Signed)
Cardiology Office Note:    Date:  11/12/2016   ID:  Ryan Medina, DOB Jun 06, 1961, MRN [id number]  PCP:  Dorena Dew, FNP  Cardiologist:  Dr. Fransico Him   Electrophysiologist:  n/a  Referring MD: Dorena Dew, FNP   Chief Complaint  Patient presents with  . Chest Pain    History of Present Illness:    Ryan Medina is a 56 y.o. male with a hx of HTN, HL, DM2.  He underwent a stress perfusion study in 8/17 after a visit to the ED with chest pain. The Myoview was high risk with EF 36% and large inferior defect.  A LHC demonstrated no significant CAD (mRCA 35%) and normal ejection fraction.     He presents for follow-up and further evaluation of chest discomfort. He notes chest pain for the last year. He describes it as a spasm in his left chest. He notes exertional symptoms with walking up hills or walking fast. He denies any radiating symptoms or associated nausea.  He does note diaphoresis. He does note dyspnea with exertion. He still smokes and has likely been diagnosed with COPD recently. He tells me he was placed on inhalers. He denies orthopnea, PND or edema. He denies syncope but has been dizzy at times. He denies pleuritic chest pain or chest pain with lying supine. He does note relief of his chest pain with nitroglycerin.  Prior CV studies:   The following studies were reviewed today:  LHC 8/17 RCA mid 35 EF 55-65 No angiographic evidence of any significant disease to cause a high risk stress test with inferior ischemia. There is streaming of flow down the very large caliber RCA, and may be a 40% stenosis but no more than that.  Myoview 8/17 EF 36, fixed large inferior defect with peri-infarct ischemia; high risk  Past Medical History:  Diagnosis Date  . Back pain, chronic   . Diabetes ([place])   . HLD (hyperlipidemia)   . Hypertension   . IDDM (insulin dependent diabetes mellitus) ([place])   . Ruptured lumbar disc     Past Surgical History:  Procedure  Laterality Date  . ABDOMINAL SURGERY    . APPENDECTOMY    . BACK SURGERY    . CARDIAC CATHETERIZATION N/A 05/19/2016   Procedure: Left Heart Cath and Coronary Angiography;  Surgeon: Leonie Man, MD;  Location: [facility];  Service: Cardiovascular;  Laterality: N/A;    Current Medications: Current Meds  Medication Sig  . amLODipine (NORVASC) 10 MG tablet Take 1 tablet (10 mg total) by mouth daily.  Marland Kitchen aspirin EC 81 MG tablet Take 1 tablet (81 mg total) by mouth daily.  Marland Kitchen atorvastatin (LIPITOR) 20 MG tablet Take 1 tablet (20 mg total) by mouth daily.  Marland Kitchen gabapentin (NEURONTIN) 300 MG capsule Take 1 capsule (300 mg total) by mouth at bedtime.  Marland Kitchen glucose blood (AGAMATRIX PRESTO TEST) test strip Check blood sugars prior to meals and at bedtime  . insulin aspart (NOVOLOG) 100 unit/mL injection TAKE INSULIN PER SLIDING SCALE  . insulin detemir (LEVEMIR) 100 UNIT/ML injection INJECT 20 UNITS SUBCUTANEOUSLY 2 TIMES DAILY  . meloxicam (MOBIC) 15 MG tablet Take 1 tablet (15 mg total) by mouth daily.  . metFORMIN (GLUCOPHAGE) 1000 MG tablet Take 1 tablet (1,000 mg total) by mouth 2 (two) times daily with a meal.  . nitroGLYCERIN (NITROSTAT) 0.4 MG SL tablet Place 1 tablet (0.4 mg total) under the tongue every 5 (five) minutes as needed for  chest pain (x 3 doses).  . [DISCONTINUED] hydrochlorothiazide (HYDRODIURIL) 25 MG tablet Take 1 tablet (25 mg total) by mouth daily.   Current Facility-Administered Medications for the 11/12/16 encounter (Office Visit) with Liliane Shi, PA-C  Medication  . insulin lispro protamine-lispro (HUMALOG 50/50 MIX) (50-50) 100 UNIT/ML injection 10 Units     Allergies:   Lisinopril   Social History   Social History  . Marital status: Single    Spouse name: N/A  . Number of children: N/A  . Years of education: N/A   Social History Main Topics  . Smoking status: Current Some Day Smoker    Packs/day: 1.00    Years: 25.00    Types: Cigarettes  .  Smokeless tobacco: Never Used  . Alcohol use No  . Drug use: No  . Sexual activity: Not Asked   Other Topics Concern  . None   Social History Narrative  . None     Family History  Problem Relation Age of Onset  . Diabetes Mother   . Heart failure Mother   . Diabetes Sister   . Diabetes Brother      ROS:   Please see the history of present illness.    Review of Systems  Constitution: Positive for diaphoresis.  Cardiovascular: Positive for chest pain and dyspnea on exertion.  Respiratory: Positive for shortness of breath.   Musculoskeletal: Positive for back pain, joint pain and myalgias.  Gastrointestinal: Positive for abdominal pain.  Neurological: Positive for dizziness and headaches.   All other systems reviewed and are negative.   EKGs/Labs/Other Test Reviewed:    EKG:  EKG is  ordered today.  The ekg ordered today demonstrates NSR, HR 73, normal axis, ?LVH, QTc 383 ms, NSSTTW changes  Recent Labs: 03/22/2016: Magnesium 1.9 08/17/2016: ALT 14 10/27/2016: BUN 20; Creatinine, Ser 1.10; Hemoglobin 13.5; Platelets 282; Potassium 3.7; Sodium 137 11/12/2016: NT-Pro BNP 17   Recent Lipid Panel    Component Value Date/Time   CHOL 210 (H) 11/07/2016 0902   TRIG 77 11/07/2016 0902   HDL 47 11/07/2016 0902   CHOLHDL 4.5 11/07/2016 0902   VLDL 15 11/07/2016 0902   LDLCALC 148 (H) 11/07/2016 0902     Physical Exam:    VS:  BP 110/70   Pulse 88   Ht 5\' 11"  (1.803 m)   Wt 197 lb 1.9 oz (89.4 kg)   BMI 27.49 kg/m     Wt Readings from Last 3 Encounters:  11/12/16 197 lb 1.9 oz (89.4 kg)  11/07/16 200 lb 12.8 oz (91.1 kg)  10/27/16 204 lb (92.5 kg)     Physical Exam  Constitutional: He is oriented to person, place, and time. He appears well-developed and well-nourished. No distress.  HENT:  Head: Normocephalic and atraumatic.  Eyes: No scleral icterus.  Neck: Normal range of motion. No JVD present.  Cardiovascular: Normal rate, regular rhythm, S1 normal and S2  normal.   No murmur heard. Pulmonary/Chest: Effort normal. He has decreased breath sounds. He has no wheezes. He has no rhonchi. He has no rales.  Abdominal: Soft. There is no tenderness.  Musculoskeletal: He exhibits no edema.  Neurological: He is alert and oriented to person, place, and time.  Skin: Skin is warm and dry.  Psychiatric: He has a normal mood and affect.    ASSESSMENT:    1. Coronary artery disease involving native coronary artery of native heart with angina pectoris ([place])   2. Shortness of breath   3.  Essential hypertension   4. Other hyperlipidemia   5. Tobacco abuse   6. AAA (abdominal aortic aneurysm) without rupture (HCC)    PLAN:    In order of problems listed above:  1. CAD with Angina - He describes exertional L sided chest pain that is NTG responsive. His LHC in 8/17 demonstrated no sig CAD.  He did have minor plaque in the RCA with ~ 35% stenosis.  This would not cause angina.  But, he is a diabetic and may have microvascular ischemia.  As his symptoms are responsive to NTG, I think he would benefit from long acting nitrates.  He does not take PDE-5 inhibitors.  -  Start Imdur 30 mg QD  -  Continue ASA, statin, amlodipine.    2. Dyspnea - His shortness of breath is most likely related to COPD with ongoing smoking.  But, he does have borderline LVH on his ECG.  His ejection fraction on nuclear study was low but his ejection fraction on LHC was normal.    -  Check BNP >> change HCTZ to Lasix if abnormal  -  Get echo to assess LVH, diastolic function  3. HTN - BP is well controlled.  Since I am starting Imdur, I will decrease HCTZ to 12.5 mg QD to avoid hypotension.   4. HL - LDL was 148 in 2/18.  PCP has restarted Lipitor.  I have encouraged him to start this.  5. Tobacco abuse - I have recommended cessation.   6. AAA - A recent MRI demonstrated a 3.2 cm AAA.  He sees [facility] in April for testing and follow up.     Medication Adjustments/Labs and Tests  Ordered: Current medicines are reviewed at length with the patient today.  Concerns regarding medicines are outlined above.  Medication changes, Labs and Tests ordered today are outlined in the Patient Instructions noted below. Patient Instructions  Medication Instructions:  1. START IMDUR 30 MG DAILY; RX HAS BEEN SENT IN 2. DECREASE HCTZ TO 12.5 MG DAILY; THIS WILL BE 1/2 TABLET OF THE 25 MG TABLET; A NEW RX HAS BEEN SENT IN  Labwork: TODAY PRO BNP  Testing/Procedures: 1. Your physician has requested that you have an echocardiogram. Echocardiography is a painless test that uses sound waves to create images of your heart. It provides your doctor with information about the size and shape of your heart and how well your heart's chambers and valves are working. This procedure takes approximately one hour. There are no restrictions for this procedure.  Follow-Up: Arcenio Mullaly, PAC 1 MONTH  Any Other Special Instructions Will Be Listed Below (If Applicable).  If you need a refill on your cardiac medications before your next appointment, please call your pharmacy.    Return in about 1 month (around 12/10/2016) for Close Follow Up, w/ Richardson Dopp, PA-C.   Signed, Richardson Dopp, PA-C  11/12/2016 4:51 PM    [facility] [place], [place], [place]  [address] Phone: [phone]; Fax: [phone]

## 2016-11-12 ENCOUNTER — Ambulatory Visit (INDEPENDENT_AMBULATORY_CARE_PROVIDER_SITE_OTHER): Payer: Medicaid Other | Admitting: Physician Assistant

## 2016-11-12 ENCOUNTER — Telehealth: Payer: Self-pay | Admitting: *Deleted

## 2016-11-12 ENCOUNTER — Encounter: Payer: Self-pay | Admitting: Physician Assistant

## 2016-11-12 VITALS — BP 110/70 | HR 88 | Ht 71.0 in | Wt 197.1 lb

## 2016-11-12 DIAGNOSIS — I10 Essential (primary) hypertension: Secondary | ICD-10-CM

## 2016-11-12 DIAGNOSIS — I25119 Atherosclerotic heart disease of native coronary artery with unspecified angina pectoris: Secondary | ICD-10-CM

## 2016-11-12 DIAGNOSIS — I714 Abdominal aortic aneurysm, without rupture, unspecified: Secondary | ICD-10-CM

## 2016-11-12 DIAGNOSIS — E784 Other hyperlipidemia: Secondary | ICD-10-CM | POA: Diagnosis not present

## 2016-11-12 DIAGNOSIS — Z720 Tobacco use: Secondary | ICD-10-CM

## 2016-11-12 DIAGNOSIS — R0602 Shortness of breath: Secondary | ICD-10-CM

## 2016-11-12 DIAGNOSIS — E7849 Other hyperlipidemia: Secondary | ICD-10-CM

## 2016-11-12 LAB — PRO B NATRIURETIC PEPTIDE: NT-Pro BNP: 17 pg/mL (ref 0–210)

## 2016-11-12 MED ORDER — HYDROCHLOROTHIAZIDE 12.5 MG PO CAPS: 12.5000 mg | ORAL_CAPSULE | Freq: Every day | ORAL | 3 refills | Status: DC

## 2016-11-12 MED ORDER — ISOSORBIDE MONONITRATE ER 30 MG PO TB24: 30.0000 mg | ORAL_TABLET | Freq: Every day | ORAL | 3 refills | Status: DC

## 2016-11-12 NOTE — Patient Instructions (Addendum)
Medication Instructions:  1. START IMDUR 30 MG DAILY; RX HAS BEEN SENT IN 2. DECREASE HCTZ TO 12.5 MG DAILY; THIS WILL BE 1/2 TABLET OF THE 25 MG TABLET; A NEW RX HAS BEEN SENT IN  Labwork: TODAY PRO BNP  Testing/Procedures: 1. Your physician has requested that you have an echocardiogram. Echocardiography is a painless test that uses sound waves to create images of your heart. It provides your doctor with information about the size and shape of your heart and how well your heart's chambers and valves are working. This procedure takes approximately one hour. There are no restrictions for this procedure.  Follow-Up: SCOTT WEAVER, PAC 1 MONTH  Any Other Special Instructions Will Be Listed Below (If Applicable).  If you need a refill on your cardiac medications before your next appointment, please call your pharmacy.

## 2016-11-12 NOTE — Telephone Encounter (Signed)
DPR ok to leave detailed message on cell. Lab work normal. If any questions call [phone].

## 2016-11-26 ENCOUNTER — Encounter (HOSPITAL_COMMUNITY): Payer: Self-pay | Admitting: *Deleted

## 2016-11-26 ENCOUNTER — Emergency Department (HOSPITAL_COMMUNITY): Payer: Medicaid Other

## 2016-11-26 DIAGNOSIS — E1165 Type 2 diabetes mellitus with hyperglycemia: Secondary | ICD-10-CM | POA: Diagnosis not present

## 2016-11-26 DIAGNOSIS — Z79899 Other long term (current) drug therapy: Secondary | ICD-10-CM | POA: Insufficient documentation

## 2016-11-26 DIAGNOSIS — F17210 Nicotine dependence, cigarettes, uncomplicated: Secondary | ICD-10-CM | POA: Insufficient documentation

## 2016-11-26 DIAGNOSIS — I10 Essential (primary) hypertension: Secondary | ICD-10-CM | POA: Diagnosis not present

## 2016-11-26 DIAGNOSIS — Z7982 Long term (current) use of aspirin: Secondary | ICD-10-CM | POA: Diagnosis not present

## 2016-11-26 DIAGNOSIS — Z794 Long term (current) use of insulin: Secondary | ICD-10-CM | POA: Insufficient documentation

## 2016-11-26 DIAGNOSIS — R110 Nausea: Secondary | ICD-10-CM | POA: Diagnosis present

## 2016-11-26 LAB — CBC WITH DIFFERENTIAL/PLATELET
BASOS ABS: 0 10*3/uL (ref 0.0–0.1)
Basophils Relative: 0 %
Eosinophils Absolute: 0.1 10*3/uL (ref 0.0–0.7)
Eosinophils Relative: 2 %
HEMATOCRIT: 40.2 % (ref 39.0–52.0)
Hemoglobin: 13.3 g/dL (ref 13.0–17.0)
LYMPHS PCT: 41 %
Lymphs Abs: 2 10*3/uL (ref 0.7–4.0)
MCH: 29.1 pg (ref 26.0–34.0)
MCHC: 33.1 g/dL (ref 30.0–36.0)
MCV: 88 fL (ref 78.0–100.0)
MONO ABS: 0.4 10*3/uL (ref 0.1–1.0)
Monocytes Relative: 9 %
NEUTROS ABS: 2.4 10*3/uL (ref 1.7–7.7)
Neutrophils Relative %: 48 %
Platelets: 256 10*3/uL (ref 150–400)
RBC: 4.57 MIL/uL (ref 4.22–5.81)
RDW: 13 % (ref 11.5–15.5)
WBC: 5 10*3/uL (ref 4.0–10.5)

## 2016-11-26 LAB — BASIC METABOLIC PANEL
ANION GAP: 7 (ref 5–15)
BUN: 18 mg/dL (ref 6–20)
CHLORIDE: 100 mmol/L — AB (ref 101–111)
CO2: 29 mmol/L (ref 22–32)
Calcium: 9.1 mg/dL (ref 8.9–10.3)
Creatinine, Ser: 1.18 mg/dL (ref 0.61–1.24)
GFR calc Af Amer: >60 mL/min (ref >60)
GFR calc non Af Amer: >60 mL/min (ref >60)
GLUCOSE: 286 mg/dL — AB (ref 65–99)
POTASSIUM: 3.9 mmol/L (ref 3.5–5.1)
Sodium: 136 mmol/L (ref 135–145)

## 2016-11-26 NOTE — ED Triage Notes (Signed)
The pt reports that he ate some beef stew earlier today and since then he has been nauseated and sob.  No sob at present no pain  He reports he has heart issues but cannot tell me what they are

## 2016-11-27 ENCOUNTER — Other Ambulatory Visit (HOSPITAL_COMMUNITY): Payer: Self-pay

## 2016-11-27 ENCOUNTER — Emergency Department (HOSPITAL_COMMUNITY)
Admission: EM | Admit: 2016-11-27 | Discharge: 2016-11-27 | Disposition: A | Discharge status: Home / Self Care | Payer: Medicaid Other | Attending: Emergency Medicine | Admitting: Emergency Medicine

## 2016-11-27 DIAGNOSIS — R110 Nausea: Secondary | ICD-10-CM

## 2016-11-27 DIAGNOSIS — R739 Hyperglycemia, unspecified: Secondary | ICD-10-CM

## 2016-11-27 LAB — HEPATIC FUNCTION PANEL
ALT: 24 U/L (ref 17–63)
AST: 25 U/L (ref 15–41)
Albumin: 3.5 g/dL (ref 3.5–5.0)
Alkaline Phosphatase: 53 U/L (ref 38–126)
Total Bilirubin: 0.4 mg/dL (ref 0.3–1.2)
Total Protein: 6.4 g/dL — ABNORMAL LOW (ref 6.5–8.1)

## 2016-11-27 LAB — I-STAT TROPONIN, ED: TROPONIN I, POC: 0 ng/mL (ref 0.00–0.08)

## 2016-11-27 LAB — LIPASE, BLOOD: LIPASE: 40 U/L (ref 11–51)

## 2016-11-27 MED ORDER — ONDANSETRON 8 MG PO TBDP: 8.0000 mg | ORAL_TABLET | Freq: Three times a day (TID) | ORAL | 0 refills | Status: DC | PRN

## 2016-11-27 MED ORDER — ONDANSETRON 4 MG PO TBDP
8.0000 mg | ORAL_TABLET | Freq: Once | ORAL | Status: AC
  Administered 2016-11-27: 8 mg via ORAL
  Filled 2016-11-27: qty 2

## 2016-11-27 NOTE — Discharge Instructions (Signed)
Take nausea medication as prescribed as needed. Avoid any spicy, tomato-based foods, avoid any alcohol or NSAID medications. Please follow-up with your family doctor and cardiology. Return if worsening symptoms.

## 2016-11-27 NOTE — ED Provider Notes (Signed)
[facility] DEPT Provider Note   CSN: [id number] Arrival date & time: 11/26/16  2111     History   Chief Complaint Chief Complaint  Patient presents with  . Shortness of Breath  . Nausea    HPI Ryan Medina is a 56 y.o. male.  HPI Ryan Medina is a 56 y.o. male with history of diabetes, hypertension, hyperlipidemia, chronic back pain, presents to emergency department complaining of nausea, abdominal pain, shortness of breath. Patient states his symptoms started earlier yesterday after eating lunch. Patient states he ate beef stew. He states that his shortness of breath lasted for approximately an hour and now resolved. He continues to have nausea. He reports some abdominal pain. Pain is diffuse. Denies any vomiting. Denies any fever or chills. No diarrhea. He has not taken anything for his symptoms prior to coming in. Denies chest pain or back pain. No recent travel or surgeries. Pt has an apt with cardiology today.   Past Medical History:  Diagnosis Date  . Back pain, chronic   . Diabetes ([place])   . HLD (hyperlipidemia)   . Hypertension   . IDDM (insulin dependent diabetes mellitus) ([place])   . Ruptured lumbar disc     Patient Active Problem List   Diagnosis Date Noted  . Other hyperlipidemia 11/11/2016  . Abnormal nuclear stress test 05/19/2016  . Atypical angina ([place]) 04/25/2016  . DM (diabetes mellitus), type 2, uncontrolled ([place]) 03/14/2015  . Essential hypertension 03/14/2015  . Hyperglycemia 01/14/2015    Past Surgical History:  Procedure Laterality Date  . ABDOMINAL SURGERY    . APPENDECTOMY    . BACK SURGERY    . CARDIAC CATHETERIZATION N/A 05/19/2016   Procedure: Left Heart Cath and Coronary Angiography;  Surgeon: Leonie Man, MD;  Location: [facility];  Service: Cardiovascular;  Laterality: N/A;       Home Medications    Prior to Admission medications   Medication Sig Start Date End Date Taking? Authorizing Provider  amLODipine  (NORVASC) 10 MG tablet Take 1 tablet (10 mg total) by mouth daily. 09/23/16   Dorena Dew, FNP  aspirin EC 81 MG tablet Take 1 tablet (81 mg total) by mouth daily. 11/07/16   Dorena Dew, FNP  atorvastatin (LIPITOR) 20 MG tablet Take 1 tablet (20 mg total) by mouth daily. 11/07/16   Dorena Dew, FNP  gabapentin (NEURONTIN) 300 MG capsule Take 1 capsule (300 mg total) by mouth at bedtime. 09/23/16   Dorena Dew, FNP  glucose blood (AGAMATRIX PRESTO TEST) test strip Check blood sugars prior to meals and at bedtime 11/07/16   Dorena Dew, FNP  hydrochlorothiazide (MICROZIDE) 12.5 MG capsule Take 1 capsule (12.5 mg total) by mouth daily. 11/12/16 02/10/17  Liliane Shi, PA-C  insulin aspart (NOVOLOG) 100 unit/mL injection TAKE INSULIN PER SLIDING SCALE    Historical Provider, MD  insulin detemir (LEVEMIR) 100 UNIT/ML injection INJECT 20 UNITS SUBCUTANEOUSLY 2 TIMES DAILY 09/11/16   Dorena Dew, FNP  isosorbide mononitrate (IMDUR) 30 MG 24 hr tablet Take 1 tablet (30 mg total) by mouth daily. 11/12/16 02/10/17  Liliane Shi, PA-C  meloxicam (MOBIC) 15 MG tablet Take 1 tablet (15 mg total) by mouth daily. 09/23/16   Dorena Dew, FNP  metFORMIN (GLUCOPHAGE) 1000 MG tablet Take 1 tablet (1,000 mg total) by mouth 2 (two) times daily with a meal. 09/23/16   Dorena Dew, FNP  nitroGLYCERIN (NITROSTAT) 0.4 MG SL tablet  Place 1 tablet (0.4 mg total) under the tongue every 5 (five) minutes as needed for chest pain (x 3 doses). 09/23/16   Dorena Dew, FNP    Family History Family History  Problem Relation Age of Onset  . Diabetes Mother   . Heart failure Mother   . Diabetes Sister   . Diabetes Brother     Social History Social History  Substance Use Topics  . Smoking status: Current Some Day Smoker    Packs/day: 1.00    Years: 25.00    Types: Cigarettes  . Smokeless tobacco: Never Used  . Alcohol use No     Allergies   Lisinopril   Review of Systems Review of  Systems  Constitutional: Negative for chills and fever.  Respiratory: Positive for shortness of breath. Negative for cough and chest tightness.   Cardiovascular: Negative for chest pain, palpitations and leg swelling.  Gastrointestinal: Positive for nausea. Negative for abdominal distention, abdominal pain, diarrhea and vomiting.  Genitourinary: Negative for dysuria, frequency, hematuria and urgency.  Musculoskeletal: Negative for arthralgias, myalgias, neck pain and neck stiffness.  Skin: Negative for rash.  Allergic/Immunologic: Negative for immunocompromised state.  Neurological: Negative for dizziness, weakness, light-headedness, numbness and headaches.  All other systems reviewed and are negative.    Physical Exam Updated Vital Signs BP 127/96 (BP Location: Right Arm)   Pulse 77   Temp 98.4 F (36.9 C) (Oral)   Resp 16   SpO2 97%   Physical Exam  Constitutional: He appears well-developed and well-nourished. No distress.  HENT:  Head: Normocephalic and atraumatic.  Eyes: Conjunctivae are normal.  Neck: Neck supple.  Cardiovascular: Normal rate, regular rhythm and normal heart sounds.   Pulmonary/Chest: Effort normal. No respiratory distress. He has no wheezes. He has no rales.  Abdominal: Soft. Bowel sounds are normal. He exhibits no distension. There is no tenderness. There is no rebound.  Musculoskeletal: He exhibits no edema.  Neurological: He is alert.  Skin: Skin is warm and dry.  Nursing note and vitals reviewed.    ED Treatments / Results  Labs (all labs ordered are listed, but only abnormal results are displayed) Labs Reviewed  BASIC METABOLIC PANEL - Abnormal; Notable for the following:       Result Value   Chloride 100 (*)    Glucose, Bld 286 (*)    All other components within normal limits  CBC WITH DIFFERENTIAL/PLATELET  HEPATIC FUNCTION PANEL  LIPASE, BLOOD  I-STAT TROPOININ, ED    EKG  EKG Interpretation  Date/Time:  Wednesday November 26 2016  21:25:57 EST Ventricular Rate:  83 PR Interval:  156 QRS Duration: 80 QT Interval:  334 QTC Calculation: 392 R Axis:   22 Text Interpretation:  Normal sinus rhythm Right atrial enlargement Left ventricular hypertrophy with repolarization abnormality Abnormal ECG No significant change since last tracing Confirmed by Glynn Octave [phone]) on 11/27/2016 1:59:34 AM       Radiology Dg Chest 2 View  Result Date: 11/26/2016 CLINICAL DATA:  Shortness of breath, nausea after eating after eating beef stew. History of hypertension, diabetes, smoker. EXAM: CHEST  2 VIEW COMPARISON:  Chest radiograph November 16, 2016 FINDINGS: Cardiomediastinal silhouette is normal. Mildly tortuous aorta associated with hypertension. No pleural effusions or focal consolidations. Similar strandy densities LEFT lung base. Mild thickening of the major fissure. Mild hyperinflation. Trachea projects midline and there is no pneumothorax. Soft tissue planes and included osseous structures are non-suspicious. IMPRESSION: Stable hyperinflation and LEFT lung base atelectasis/  scarring. Electronically Signed   By: Elon Alas M.D.   On: 11/26/2016 21:51    Procedures Procedures (including critical care time)  Medications Ordered in ED Medications  ondansetron (ZOFRAN-ODT) disintegrating tablet 8 mg (8 mg Oral Given 11/27/16 0247)     Initial Impression / Assessment and Plan / ED Course  I have reviewed the triage vital signs and the nursing notes.  Pertinent labs & imaging results that were available during my care of the patient were reviewed by me and considered in my medical decision making (see chart for details).    Patient in emergency department with nausea, shortness of breath, started after eating yesterday. Patient is in no acute distress, states shortness of breath has resolved but still continues to have some nausea and abdominal pain. Will check labs, chest x-ray already obtained and is negative for  any acute abnormalities. I will add troponin. Patient with recent cardiology workup which included cath 8/17, which showed no significant coronary disease, MRCA 35%, normal ejection fracture.  3:37 AM Patient's troponin, LFTs, lipase normal. Patient continues to improve, and states he has no shortness of breath or nausea at this time. His vital signs are normal. He is stable for discharge home. He has an appointment with cardiologist today, advised to follow-up. Follow-up with a family doctor. Zofran prescription for nausea as needed. Question whether this could be gastritis versus viral enteritis. Return precautions discussed.  Vitals:   11/26/16 2128 11/27/16 0207  BP: 143/95 127/96  Pulse: 97 77  Resp: 18 16  Temp: 98.4 F (36.9 C)   TempSrc: Oral   SpO2: 99% 97%      Final Clinical Impressions(s) / ED Diagnoses   Final diagnoses:  Nausea  Hyperglycemia    New Prescriptions New Prescriptions   ONDANSETRON (ZOFRAN ODT) 8 MG DISINTEGRATING TABLET    Take 1 tablet (8 mg total) by mouth every 8 (eight) hours as needed for nausea or vomiting.     Jeannett Senior, PA-C 11/27/16 [place], MD 11/27/16 [phone]

## 2016-11-28 ENCOUNTER — Emergency Department (HOSPITAL_COMMUNITY)
Admission: EM | Admit: 2016-11-28 | Discharge: 2016-11-29 | Disposition: A | Discharge status: Home / Self Care | Payer: Medicaid Other | Attending: Emergency Medicine | Admitting: Emergency Medicine

## 2016-11-28 ENCOUNTER — Encounter (HOSPITAL_COMMUNITY): Payer: Self-pay | Admitting: Emergency Medicine

## 2016-11-28 DIAGNOSIS — F17210 Nicotine dependence, cigarettes, uncomplicated: Secondary | ICD-10-CM | POA: Insufficient documentation

## 2016-11-28 DIAGNOSIS — I10 Essential (primary) hypertension: Secondary | ICD-10-CM | POA: Diagnosis not present

## 2016-11-28 DIAGNOSIS — Z794 Long term (current) use of insulin: Secondary | ICD-10-CM | POA: Insufficient documentation

## 2016-11-28 DIAGNOSIS — E119 Type 2 diabetes mellitus without complications: Secondary | ICD-10-CM | POA: Insufficient documentation

## 2016-11-28 DIAGNOSIS — Z7982 Long term (current) use of aspirin: Secondary | ICD-10-CM | POA: Diagnosis not present

## 2016-11-28 DIAGNOSIS — R1013 Epigastric pain: Secondary | ICD-10-CM | POA: Diagnosis present

## 2016-11-28 LAB — BASIC METABOLIC PANEL
Anion gap: 5 (ref 5–15)
BUN: 13 mg/dL (ref 6–20)
CO2: 29 mmol/L (ref 22–32)
Calcium: 9.2 mg/dL (ref 8.9–10.3)
Chloride: 104 mmol/L (ref 101–111)
Creatinine, Ser: 1.06 mg/dL (ref 0.61–1.24)
GFR calc Af Amer: >60 mL/min (ref >60)
GFR calc non Af Amer: >60 mL/min (ref >60)
Glucose, Bld: 344 mg/dL — ABNORMAL HIGH (ref 65–99)
Potassium: 4 mmol/L (ref 3.5–5.1)
Sodium: 138 mmol/L (ref 135–145)

## 2016-11-28 LAB — CBC
HCT: 38.1 % — ABNORMAL LOW (ref 39.0–52.0)
Hemoglobin: 12.8 g/dL — ABNORMAL LOW (ref 13.0–17.0)
MCH: 28.8 pg (ref 26.0–34.0)
MCHC: 33.6 g/dL (ref 30.0–36.0)
MCV: 85.8 fL (ref 78.0–100.0)
Platelets: 248 10*3/uL (ref 150–400)
RBC: 4.44 MIL/uL (ref 4.22–5.81)
RDW: 12.7 % (ref 11.5–15.5)
WBC: 4.8 10*3/uL (ref 4.0–10.5)

## 2016-11-28 MED ORDER — FAMOTIDINE IN NACL 20-0.9 MG/50ML-% IV SOLN
20.0000 mg | Freq: Once | INTRAVENOUS | Status: AC
  Administered 2016-11-28: 20 mg via INTRAVENOUS
  Filled 2016-11-28: qty 50

## 2016-11-28 MED ORDER — PANTOPRAZOLE SODIUM 40 MG IV SOLR
40.0000 mg | Freq: Once | INTRAVENOUS | Status: AC
  Administered 2016-11-28: 40 mg via INTRAVENOUS
  Filled 2016-11-28: qty 40

## 2016-11-28 MED ORDER — GI COCKTAIL ~~LOC~~
30.0000 mL | Freq: Once | ORAL | Status: AC
  Administered 2016-11-28: 30 mL via ORAL
  Filled 2016-11-28: qty 30

## 2016-11-28 NOTE — ED Triage Notes (Signed)
Pt c/o decreased energy since yesterday; pt unable to articulate specific symptoms besides generalized weakness; pt is ambulatory and A&Ox4; pt and fiancee deny difficulty swallowing, ataxia, and confusion; no acute neuro abnormalities present; pt does c/o headache and sore throat

## 2016-11-28 NOTE — ED Provider Notes (Signed)
[facility] DEPT Provider Note   CSN: [id number] Arrival date & time: 11/28/16  1944  By signing my name below, I, Reola Mosher, attest that this documentation has been prepared under the direction and in the presence of Virgel Manifold, MD. Electronically Signed: Reola Mosher, ED Scribe. 11/28/16. 11:28 PM.  History   Chief Complaint Chief Complaint  Patient presents with  . Fatigue   The history is provided by the patient. No language interpreter was used.    HPI Comments: Ryan Medina is a 56 y.o. male with a PMHx of DM, HTN/HLD, IDDM, who presents to the Emergency Department complaining of persistent, mild epigastric abdominal pain beginning two days ago. Per pt, two days ago he was eating lunch and shortly following his symptoms began. He notes associated nausea and fatigue secondary to the onset of his abdominal pain. He additionally notes that he did have one small episode of emesis with blood-tinged contents as well. His symptoms are exacerbated post-prandially and he also has had a decrease in appetite since the onset of his symptoms as well. Pt has a h/o gastric ulcers and states that his symptoms today are similar to the presentation of this. Pt was previously medicated for this issue, but has not been on medications to control this for approximately eight months. Per prior chart review, pt was seen for same yesterday in the ED. His workup at that time included a CXR, EKG tracing, and lab work consisting of Lipase, LFT, Troponin, and CBC/BMP which were all benign. His symptoms were ruled as likely gastritis vs viral enteritis at that time, and upon d/c he was given an rx for Zofran. No treatments for his symptoms were tried prior to coming into the ED today. No sick contacts with similar symptoms. He has not been able to check his CBG at home lately d/t him not having a blood glucose monitor. No frequent or heavy alcohol usage. Denies urgency, frequency, hematuria,  dysuria, difficulty urinating, constipation, diarrhea, fever, chills, or any other associated symptoms.   Past Medical History:  Diagnosis Date  . Back pain, chronic   . Diabetes ([place])   . HLD (hyperlipidemia)   . Hypertension   . IDDM (insulin dependent diabetes mellitus) ([place])   . Ruptured lumbar disc    Patient Active Problem List   Diagnosis Date Noted  . Other hyperlipidemia 11/11/2016  . Abnormal nuclear stress test 05/19/2016  . Atypical angina ([place]) 04/25/2016  . DM (diabetes mellitus), type 2, uncontrolled ([place]) 03/14/2015  . Essential hypertension 03/14/2015  . Hyperglycemia 01/14/2015   Past Surgical History:  Procedure Laterality Date  . ABDOMINAL SURGERY    . APPENDECTOMY    . BACK SURGERY    . CARDIAC CATHETERIZATION N/A 05/19/2016   Procedure: Left Heart Cath and Coronary Angiography;  Surgeon: Leonie Man, MD;  Location: [facility];  Service: Cardiovascular;  Laterality: N/A;    Home Medications    Prior to Admission medications   Medication Sig Start Date End Date Taking? Authorizing Provider  amLODipine (NORVASC) 10 MG tablet Take 1 tablet (10 mg total) by mouth daily. 09/23/16  Yes Dorena Dew, FNP  aspirin EC 81 MG tablet Take 1 tablet (81 mg total) by mouth daily. 11/07/16  Yes Dorena Dew, FNP  atorvastatin (LIPITOR) 20 MG tablet Take 1 tablet (20 mg total) by mouth daily. 11/07/16  Yes Dorena Dew, FNP  gabapentin (NEURONTIN) 300 MG capsule Take 1 capsule (300 mg total)  by mouth at bedtime. 09/23/16  Yes Dorena Dew, FNP  hydrochlorothiazide (MICROZIDE) 12.5 MG capsule Take 1 capsule (12.5 mg total) by mouth daily. 11/12/16 02/10/17 Yes Scott T Weaver, PA-C  ibuprofen (ADVIL,MOTRIN) 200 MG tablet Take 200 mg by mouth every 6 (six) hours as needed for moderate pain.   Yes Historical Provider, MD  insulin aspart (NOVOLOG) 100 unit/mL injection Inject 1-10 Units into the skin 3 (three) times daily before meals. PER SLIDING SCALE   Yes  Historical Provider, MD  insulin detemir (LEVEMIR) 100 UNIT/ML injection INJECT 20 UNITS SUBCUTANEOUSLY 2 TIMES DAILY 09/11/16  Yes Dorena Dew, FNP  meloxicam (MOBIC) 15 MG tablet Take 1 tablet (15 mg total) by mouth daily. 09/23/16  Yes Dorena Dew, FNP  metFORMIN (GLUCOPHAGE) 1000 MG tablet Take 1 tablet (1,000 mg total) by mouth 2 (two) times daily with a meal. 09/23/16  Yes Dorena Dew, FNP  nitroGLYCERIN (NITROSTAT) 0.4 MG SL tablet Place 1 tablet (0.4 mg total) under the tongue every 5 (five) minutes as needed for chest pain (x 3 doses). 09/23/16  Yes Dorena Dew, FNP  glucose blood (AGAMATRIX PRESTO TEST) test strip Check blood sugars prior to meals and at bedtime 11/07/16   Dorena Dew, FNP  isosorbide mononitrate (IMDUR) 30 MG 24 hr tablet Take 1 tablet (30 mg total) by mouth daily. Patient not taking: Reported on 11/27/2016 11/12/16 02/10/17  Liliane Shi, PA-C  ondansetron (ZOFRAN ODT) 8 MG disintegrating tablet Take 1 tablet (8 mg total) by mouth every 8 (eight) hours as needed for nausea or vomiting. 11/27/16   Jeannett Senior, PA-C   Family History Family History  Problem Relation Age of Onset  . Diabetes Mother   . Heart failure Mother   . Diabetes Sister   . Diabetes Brother    Social History Social History  Substance Use Topics  . Smoking status: Current Every Day Smoker    Packs/day: 1.00    Years: 25.00    Types: Cigarettes  . Smokeless tobacco: Never Used  . Alcohol use No   Allergies   Lisinopril  Review of Systems Review of Systems  Constitutional: Positive for appetite change and fatigue. Negative for chills and fever.  Gastrointestinal: Positive for abdominal pain, nausea and vomiting. Negative for constipation and diarrhea.  Genitourinary: Negative for difficulty urinating, dysuria, frequency, hematuria and urgency.  All other systems reviewed and are negative.  Physical Exam Updated Vital Signs BP (!) 144/101 (BP Location: Left Arm)    Pulse 84   Temp 98.9 F (37.2 C) (Oral)   Resp 16   Ht 5\' 11"  (1.803 m)   Wt 197 lb (89.4 kg)   SpO2 99%   BMI 27.48 kg/m   Physical Exam  Constitutional: He appears well-developed and well-nourished.  HENT:  Head: Normocephalic.  Right Ear: External ear normal.  Left Ear: External ear normal.  Nose: Nose normal.  Eyes: Conjunctivae are normal. Right eye exhibits no discharge. Left eye exhibits no discharge.  Neck: Normal range of motion.  Cardiovascular: Normal rate, regular rhythm and normal heart sounds.   No murmur heard. Pulmonary/Chest: Effort normal and breath sounds normal. No respiratory distress. He has no wheezes. He has no rales.  Abdominal: Soft. There is tenderness (mild epigastric tenderness). There is no rebound and no guarding.  Musculoskeletal: Normal range of motion. He exhibits no edema or tenderness.  Neurological: He is alert. No cranial nerve deficit. Coordination normal.  Skin: Skin is warm  and dry. No rash noted. No erythema. No pallor.  Psychiatric: He has a normal mood and affect. His behavior is normal.  Nursing note and vitals reviewed.  ED Treatments / Results  DIAGNOSTIC STUDIES: Oxygen Saturation is 99% on RA, normal by my interpretation.   COORDINATION OF CARE: 11:24 PM-Discussed next steps with pt. Pt verbalized understanding and is agreeable with the plan.   Labs (all labs ordered are listed, but only abnormal results are displayed) Labs Reviewed  BASIC METABOLIC PANEL - Abnormal; Notable for the following:       Result Value   Glucose, Bld 344 (*)    All other components within normal limits  CBC - Abnormal; Notable for the following:    Hemoglobin 12.8 (*)    HCT 38.1 (*)    All other components within normal limits  URINALYSIS, ROUTINE W REFLEX MICROSCOPIC - Abnormal; Notable for the following:    Specific Gravity, Urine 1.035 (*)    Glucose, UA >=500 (*)    Protein, ur 30 (*)    Squamous Epithelial / LPF 0-5 (*)    All  other components within normal limits  CBG MONITORING, ED   EKG  EKG Interpretation None      Radiology No results found.  Procedures Procedures   Medications Ordered in ED Medications - No data to display  Initial Impression / Assessment and Plan / ED Course  I have reviewed the triage vital signs and the nursing notes.  Pertinent labs & imaging results that were available during my care of the patient were reviewed by me and considered in my medical decision making (see chart for details).      Final Clinical Impressions(s) / ED Diagnoses   Final diagnoses:  Epigastric discomfort   New Prescriptions New Prescriptions   No medications on file   I personally preformed the services scribed in my presence. The recorded information has been reviewed is accurate. Virgel Manifold, MD.     Virgel Manifold, MD 12/09/16 1357

## 2016-11-29 LAB — URINALYSIS, ROUTINE W REFLEX MICROSCOPIC
Bacteria, UA: NONE SEEN
Bilirubin Urine: NEGATIVE
Glucose, UA: >=500 mg/dL — AB
Hgb urine dipstick: NEGATIVE
Ketones, ur: NEGATIVE mg/dL
Leukocytes, UA: NEGATIVE
Nitrite: NEGATIVE
Protein, ur: 30 mg/dL — AB
Specific Gravity, Urine: 1.035 — ABNORMAL HIGH (ref 1.005–1.030)
pH: 5 (ref 5.0–8.0)

## 2016-11-29 MED ORDER — SODIUM CHLORIDE 0.9 % IV BOLUS (SEPSIS)
1000.0000 mL | Freq: Once | INTRAVENOUS | Status: AC
  Administered 2016-11-29: 1000 mL via INTRAVENOUS

## 2016-11-29 MED ORDER — ONDANSETRON HCL 4 MG PO TABS: 4.0000 mg | ORAL_TABLET | Freq: Four times a day (QID) | ORAL | 0 refills | Status: DC

## 2016-11-29 MED ORDER — PANTOPRAZOLE SODIUM 20 MG PO TBEC: 20.0000 mg | DELAYED_RELEASE_TABLET | Freq: Every day | ORAL | 0 refills | Status: DC

## 2016-12-04 DIAGNOSIS — R531 Weakness: Secondary | ICD-10-CM | POA: Diagnosis present

## 2016-12-04 DIAGNOSIS — Z79899 Other long term (current) drug therapy: Secondary | ICD-10-CM | POA: Insufficient documentation

## 2016-12-04 DIAGNOSIS — I10 Essential (primary) hypertension: Secondary | ICD-10-CM | POA: Insufficient documentation

## 2016-12-04 DIAGNOSIS — F17210 Nicotine dependence, cigarettes, uncomplicated: Secondary | ICD-10-CM | POA: Diagnosis not present

## 2016-12-04 DIAGNOSIS — E1165 Type 2 diabetes mellitus with hyperglycemia: Secondary | ICD-10-CM | POA: Diagnosis not present

## 2016-12-04 DIAGNOSIS — Z7982 Long term (current) use of aspirin: Secondary | ICD-10-CM | POA: Insufficient documentation

## 2016-12-04 DIAGNOSIS — Z794 Long term (current) use of insulin: Secondary | ICD-10-CM | POA: Insufficient documentation

## 2016-12-04 DIAGNOSIS — R930 Abnormal findings on diagnostic imaging of skull and head, not elsewhere classified: Secondary | ICD-10-CM | POA: Diagnosis not present

## 2016-12-04 DIAGNOSIS — R200 Anesthesia of skin: Secondary | ICD-10-CM | POA: Diagnosis not present

## 2016-12-04 LAB — CBG MONITORING, ED: GLUCOSE-CAPILLARY: 252 mg/dL — AB (ref 65–99)

## 2016-12-05 ENCOUNTER — Emergency Department (HOSPITAL_COMMUNITY): Payer: Medicaid Other

## 2016-12-05 ENCOUNTER — Ambulatory Visit (INDEPENDENT_AMBULATORY_CARE_PROVIDER_SITE_OTHER): Payer: Self-pay | Admitting: Family Medicine

## 2016-12-05 ENCOUNTER — Encounter (HOSPITAL_COMMUNITY): Payer: Self-pay

## 2016-12-05 ENCOUNTER — Emergency Department (HOSPITAL_COMMUNITY)
Admission: EM | Admit: 2016-12-05 | Discharge: 2016-12-05 | Disposition: A | Discharge status: Home / Self Care | Payer: Medicaid Other | Attending: Emergency Medicine | Admitting: Emergency Medicine

## 2016-12-05 ENCOUNTER — Encounter: Payer: Self-pay | Admitting: Family Medicine

## 2016-12-05 VITALS — BP 122/79 | HR 66 | Temp 98.0°F | Resp 18 | Ht 71.0 in | Wt 201.0 lb

## 2016-12-05 DIAGNOSIS — IMO0002 Reserved for concepts with insufficient information to code with codable children: Secondary | ICD-10-CM

## 2016-12-05 DIAGNOSIS — G629 Polyneuropathy, unspecified: Secondary | ICD-10-CM

## 2016-12-05 DIAGNOSIS — Z114 Encounter for screening for human immunodeficiency virus [HIV]: Secondary | ICD-10-CM

## 2016-12-05 DIAGNOSIS — R531 Weakness: Secondary | ICD-10-CM

## 2016-12-05 DIAGNOSIS — Z1211 Encounter for screening for malignant neoplasm of colon: Secondary | ICD-10-CM

## 2016-12-05 DIAGNOSIS — E1165 Type 2 diabetes mellitus with hyperglycemia: Secondary | ICD-10-CM

## 2016-12-05 DIAGNOSIS — R0602 Shortness of breath: Secondary | ICD-10-CM

## 2016-12-05 DIAGNOSIS — I10 Essential (primary) hypertension: Secondary | ICD-10-CM

## 2016-12-05 DIAGNOSIS — Z794 Long term (current) use of insulin: Secondary | ICD-10-CM

## 2016-12-05 DIAGNOSIS — E118 Type 2 diabetes mellitus with unspecified complications: Secondary | ICD-10-CM

## 2016-12-05 DIAGNOSIS — R739 Hyperglycemia, unspecified: Secondary | ICD-10-CM

## 2016-12-05 DIAGNOSIS — Z1159 Encounter for screening for other viral diseases: Secondary | ICD-10-CM

## 2016-12-05 DIAGNOSIS — R200 Anesthesia of skin: Secondary | ICD-10-CM

## 2016-12-05 LAB — POCT URINALYSIS DIP (DEVICE)
BILIRUBIN URINE: NEGATIVE
GLUCOSE, UA: 500 mg/dL — AB
Hgb urine dipstick: NEGATIVE
Leukocytes, UA: NEGATIVE
NITRITE: NEGATIVE
PH: 7 (ref 5.0–8.0)
PROTEIN: 100 mg/dL — AB
Specific Gravity, Urine: 1.02 (ref 1.005–1.030)
Urobilinogen, UA: 4 mg/dL — ABNORMAL HIGH (ref 0.0–1.0)

## 2016-12-05 LAB — URINALYSIS, ROUTINE W REFLEX MICROSCOPIC
BILIRUBIN URINE: NEGATIVE
Bacteria, UA: NONE SEEN
Hgb urine dipstick: NEGATIVE
KETONES UR: NEGATIVE mg/dL
LEUKOCYTES UA: NEGATIVE
Nitrite: NEGATIVE
PH: 5 (ref 5.0–8.0)
Protein, ur: 30 mg/dL — AB
SPECIFIC GRAVITY, URINE: 1.035 — AB (ref 1.005–1.030)

## 2016-12-05 LAB — CBC
HCT: 39.1 % (ref 39.0–52.0)
Hemoglobin: 12.9 g/dL — ABNORMAL LOW (ref 13.0–17.0)
MCH: 29 pg (ref 26.0–34.0)
MCHC: 33 g/dL (ref 30.0–36.0)
MCV: 87.9 fL (ref 78.0–100.0)
PLATELETS: 244 10*3/uL (ref 150–400)
RBC: 4.45 MIL/uL (ref 4.22–5.81)
RDW: 13 % (ref 11.5–15.5)
WBC: 6.1 10*3/uL (ref 4.0–10.5)

## 2016-12-05 LAB — CBG MONITORING, ED
Glucose-Capillary: 343 mg/dL — ABNORMAL HIGH (ref 65–99)
Glucose-Capillary: 231 mg/dL — ABNORMAL HIGH (ref 65–99)

## 2016-12-05 LAB — I-STAT CHEM 8, ED
BUN: 20 mg/dL (ref 6–20)
CALCIUM ION: 1.13 mmol/L — AB (ref 1.15–1.40)
Chloride: 99 mmol/L — ABNORMAL LOW (ref 101–111)
Creatinine, Ser: 1 mg/dL (ref 0.61–1.24)
Glucose, Bld: 303 mg/dL — ABNORMAL HIGH (ref 65–99)
HCT: 38 % — ABNORMAL LOW (ref 39.0–52.0)
Hemoglobin: 12.9 g/dL — ABNORMAL LOW (ref 13.0–17.0)
Potassium: 3.6 mmol/L (ref 3.5–5.1)
SODIUM: 139 mmol/L (ref 135–145)
TCO2: 30 mmol/L (ref 0–100)

## 2016-12-05 LAB — POCT GLYCOSYLATED HEMOGLOBIN (HGB A1C): HEMOGLOBIN A1C: 11.3

## 2016-12-05 LAB — I-STAT TROPONIN, ED: Troponin i, poc: 0 ng/mL (ref 0.00–0.08)

## 2016-12-05 LAB — GLUCOSE, CAPILLARY: GLUCOSE-CAPILLARY: 276 mg/dL — AB (ref 65–99)

## 2016-12-05 MED ORDER — INSULIN DETEMIR 100 UNIT/ML ~~LOC~~ SOLN: SUBCUTANEOUS | 5 refills | Status: DC

## 2016-12-05 MED ORDER — SODIUM CHLORIDE 0.9 % IV BOLUS (SEPSIS)
1000.0000 mL | Freq: Once | INTRAVENOUS | Status: AC
  Administered 2016-12-05: 1000 mL via INTRAVENOUS

## 2016-12-05 MED ORDER — ASPIRIN 81 MG PO CHEW
324.0000 mg | CHEWABLE_TABLET | Freq: Once | ORAL | Status: AC
  Administered 2016-12-05: 324 mg via ORAL
  Filled 2016-12-05: qty 4

## 2016-12-05 MED ORDER — GABAPENTIN 300 MG PO CAPS: 300.0000 mg | ORAL_CAPSULE | Freq: Three times a day (TID) | ORAL | 5 refills | Status: DC

## 2016-12-05 MED ORDER — INSULIN ASPART 100 UNIT/ML ~~LOC~~ SOLN
8.0000 [IU] | Freq: Once | SUBCUTANEOUS | Status: AC
  Administered 2016-12-05: 8 [IU] via SUBCUTANEOUS
  Filled 2016-12-05: qty 1

## 2016-12-05 MED ORDER — ASPIRIN EC 325 MG PO TBEC: 325.0000 mg | DELAYED_RELEASE_TABLET | Freq: Every day | ORAL | 3 refills | Status: DC

## 2016-12-05 NOTE — Patient Instructions (Signed)
Levemir: Inject 30 units in am and 20 units HS. Will recheck blood sugars in 1 month.

## 2016-12-05 NOTE — ED Provider Notes (Signed)
TIME SEEN: 4:05 AM By signing my name below, I, Arianna Nassar, attest that this documentation has been prepared under the direction and in the presence of [employer], DO.  Electronically Signed: Julien Nordmann, ED Scribe. 12/05/16. 4:12 AM.  CHIEF COMPLAINT:  Chief Complaint  Patient presents with  . Weakness    HPI:  HPI Comments: Ryan Medina is a 56 y.o. male who has a PMhx of IDDM, HLD, HTN presents to the Emergency Department complaining of moderate, persistent generalized weakness that began today. He states associated mild shortness of breath. Pt further reports having left leg numbness ~ 9 pm last night that lasted ~ 10-15 minutes. He says that the numbness has alleviated. He has been seen in the past for left leg weakness but expresses that it is not currently weak at this time. Pt is on one 81 mg ASA but is otherwise not on any anticoagulants. Denies that he's ever had a stroke or TIA. He denies facial/upper extremity numbness, headache, focal weakness, chest pain, fever, cough, nausea, vomiting, or diarrhea. Pt is a smoker.    ROS: See HPI Constitutional: no fever  Eyes: no drainage  ENT: no runny nose   Cardiovascular:  no chest pain  Resp: no SOB  GI: no vomiting GU: no dysuria Integumentary: no rash  Allergy: no hives  Musculoskeletal: no leg swelling  Neurological: no slurred speech ROS otherwise negative  PAST MEDICAL HISTORY/PAST SURGICAL HISTORY:  Past Medical History:  Diagnosis Date  . Back pain, chronic   . Diabetes ([place])   . HLD (hyperlipidemia)   . Hypertension   . IDDM (insulin dependent diabetes mellitus) ([place])   . Ruptured lumbar disc     MEDICATIONS:  Prior to Admission medications   Medication Sig Start Date End Date Taking? Authorizing Provider  amLODipine (NORVASC) 10 MG tablet Take 1 tablet (10 mg total) by mouth daily. 09/23/16   Dorena Dew, FNP  aspirin EC 81 MG tablet Take 1 tablet (81 mg total) by mouth daily. 11/07/16   Dorena Dew, FNP  atorvastatin (LIPITOR) 20 MG tablet Take 1 tablet (20 mg total) by mouth daily. 11/07/16   Dorena Dew, FNP  gabapentin (NEURONTIN) 300 MG capsule Take 1 capsule (300 mg total) by mouth at bedtime. 09/23/16   Dorena Dew, FNP  glucose blood (AGAMATRIX PRESTO TEST) test strip Check blood sugars prior to meals and at bedtime 11/07/16   Dorena Dew, FNP  hydrochlorothiazide (MICROZIDE) 12.5 MG capsule Take 1 capsule (12.5 mg total) by mouth daily. 11/12/16 02/10/17  Liliane Shi, PA-C  ibuprofen (ADVIL,MOTRIN) 200 MG tablet Take 200 mg by mouth every 6 (six) hours as needed for moderate pain.    Historical Provider, MD  insulin aspart (NOVOLOG) 100 unit/mL injection Inject 1-10 Units into the skin 3 (three) times daily before meals. PER SLIDING SCALE    Historical Provider, MD  insulin detemir (LEVEMIR) 100 UNIT/ML injection INJECT 20 UNITS SUBCUTANEOUSLY 2 TIMES DAILY 09/11/16   Dorena Dew, FNP  isosorbide mononitrate (IMDUR) 30 MG 24 hr tablet Take 1 tablet (30 mg total) by mouth daily. Patient not taking: Reported on 11/27/2016 11/12/16 02/10/17  Liliane Shi, PA-C  meloxicam (MOBIC) 15 MG tablet Take 1 tablet (15 mg total) by mouth daily. 09/23/16   Dorena Dew, FNP  metFORMIN (GLUCOPHAGE) 1000 MG tablet Take 1 tablet (1,000 mg total) by mouth 2 (two) times daily with a meal. 09/23/16   Lovett Sox  Durenda Guthrie, FNP  nitroGLYCERIN (NITROSTAT) 0.4 MG SL tablet Place 1 tablet (0.4 mg total) under the tongue every 5 (five) minutes as needed for chest pain (x 3 doses). 09/23/16   Dorena Dew, FNP  ondansetron (ZOFRAN ODT) 8 MG disintegrating tablet Take 1 tablet (8 mg total) by mouth every 8 (eight) hours as needed for nausea or vomiting. 11/27/16   Tatyana Kirichenko, PA-C  ondansetron (ZOFRAN) 4 MG tablet Take 1 tablet (4 mg total) by mouth every 6 (six) hours. 11/29/16   Virgel Manifold, MD  pantoprazole (PROTONIX) 20 MG tablet Take 1 tablet (20 mg total) by mouth daily. 11/29/16    Virgel Manifold, MD    ALLERGIES:  Allergies  Allergen Reactions  . Lisinopril Anaphylaxis    SOCIAL HISTORY:  Social History  Substance Use Topics  . Smoking status: Current Every Day Smoker    Packs/day: 1.00    Years: 25.00    Types: Cigarettes  . Smokeless tobacco: Never Used  . Alcohol use No    FAMILY HISTORY: Family History  Problem Relation Age of Onset  . Diabetes Mother   . Heart failure Mother   . Diabetes Sister   . Diabetes Brother     EXAM: BP 112/80   Pulse 66   Temp 97.8 F (36.6 C) (Oral)   Resp 16   Ht 5\' 11"  (1.803 m)   Wt 197 lb (89.4 kg)   SpO2 94%   BMI 27.48 kg/m  CONSTITUTIONAL: Alert and oriented and responds appropriately to questions. Well-appearing; well-nourished HEAD: Normocephalic EYES: Conjunctivae clear, pupils appear equal, EOMI ENT: normal nose; moist mucous membranes NECK: Supple, no meningismus, no nuchal rigidity, no LAD  CARD: RRR; S1 and S2 appreciated; no murmurs, no clicks, no rubs, no gallops RESP: Normal chest excursion without splinting or tachypnea; breath sounds clear and equal bilaterally; no wheezes, no rhonchi, no rales, no hypoxia or respiratory distress, speaking full sentences ABD/GI: Normal bowel sounds; non-distended; soft, non-tender, no rebound, no guarding, no peritoneal signs, no hepatosplenomegaly BACK:  The back appears normal and is non-tender to palpation, there is no CVA tenderness EXT: Normal ROM in all joints; non-tender to palpation; no edema; normal capillary refill; no cyanosis, no calf tenderness or swelling    SKIN: Normal color for age and race; warm; no rash NEURO: Moves all extremities equally; Strength 5/5 in all four extremities.  Normal sensation diffusely.  CN 2-12 grossly intact.  Normal speech.   PSYCH: The patient's mood and manner are appropriate. Grooming and personal hygiene are appropriate.  MEDICAL DECISION MAKING: Patient here is feeling weak all over. Initially told triage nurse  that he felt like his blood sugar was low but he is asked hyperglycemic here but not in DKA. Labs, urine are otherwise unremarkable. We'll give IV fluids and subcutaneous insulin. He is on sliding-scale insulin and Levemir at home. Does report an episode of 10 and 15 minutes of complete left leg numbness. He has multiple risk factors for stroke and TIA. We'll obtain head CT and discuss with neurology for further recommendations. Only antiplatelets or anticoagulants. States he has been having chronic headaches for the past several weeks but has no headache currently. Just had an MRI of his brain and February for left leg weakness which showed no acute abnormality.  ED PROGRESS: 5:30 AM  Pt has recently had an MRI of the brain.  CT of the head shows no acute abnormality. It is not clear that this episode of numbness  of the left leg was a TIA. I have discussed his case with Dr. Leonel Ramsay with neurology who agrees that this patient can meet discharged home and have outpatient neurology follow-up. Patient reports he takes 81 mg of aspirin daily. Neurology recommended increasing this to 325 mg of aspirin daily.  It appears he has an echocardiogram scheduled as an outpatient on March 23.   6:10 AM Pt's blood glucose has improved. He is still asymptomatic. I feel he is safe to be discharged home. Have recommended he increase his aspirin daily and he is comfortable with this plan. Recommend close PCP and neurology outpatient follow-up. Discussed return precautions.   At this time, I do not feel there is any life-threatening condition present. I have reviewed and discussed all results (EKG, imaging, lab, urine as appropriate) and exam findings with patient/family. I have reviewed nursing notes and appropriate previous records.  I feel the patient is safe to be discharged home without further emergent workup and can continue workup as an outpatient as needed. Discussed usual and customary return precautions.  Patient/family verbalize understanding and are comfortable with this plan.  Outpatient follow-up has been provided if needed. All questions have been answered.     EKG Interpretation  Date/Time:  Friday December 05 2016 00:05:45 EDT Ventricular Rate:  76 PR Interval:  146 QRS Duration: 88 QT Interval:  378 QTC Calculation: 425 R Axis:   47 Text Interpretation:  Normal sinus rhythm with sinus arrhythmia T wave abnormality, consider lateral ischemia Abnormal ECG No significant change since last tracing Confirmed by Mcqueen,  DO, Darren Caldron ([id number]) on 12/05/2016 3:59:21 AM        I personally performed the services described in this documentation, which was scribed in my presence. The recorded information has been reviewed and is accurate.    [place], DO 12/05/16 [phone]

## 2016-12-05 NOTE — ED Triage Notes (Signed)
Pt reports he feels like his sugar is low. PT reports feeling weak, dizzy, and sob. He endorses chest pain at about 2100 but none at this time. CBG in triage 252. Pt reports the symptoms began at 2100. PT ambulatory in triage.

## 2016-12-05 NOTE — Progress Notes (Signed)
Subjective:    Patient ID: Ryan Medina, male    DOB: 12-May-1961, 56 y.o.   MRN: [id number]  HPI Ryan Medina, a 56 year old male with a history of uncontrolled diabetes and hypertension presents for a follow up of chronic conditions. He says that he has been taking antidiabetic medications consistently.  He states that blood sugars have been consistently above 200. He is unable to follow a carbohydrate modified diet due to the fact that he a resident of the [facility] and all meals are planned. Patient denies foot ulcerations, hyperglycemia, nausea, paresthesia of the feet, polydipsia, polyuria, visual disturbances, vomitting and weight loss.  Patient also has a history of hypertension. Marland Kitchen He is not exercising and is not adherent to low salt diet.He says that he has been experiencing periodic chest pains. Pains are primarily to left chest and does not radiate. He was evaluated in the emergency department on 12/04/2016  for chest pain and shortness of breath. He was prescribed Nitrostat tabs and chest pain decreases. He has a follow up appt scheduled on cardiology on 01/07/2017.  Patient currently denies chest pain, chest pressure/discomfort, fatigue, orthopnea, palpitations, syncope and tachypnea.  Cardiovascular risk factors: obesity (BMI >= 30 kg/m2), sedentary lifestyle and smoking/ tobacco exposure.  Social History   Social History  . Marital status: Single    Spouse name: N/A  . Number of children: N/A  . Years of education: N/A   Occupational History  . Not on file.   Social History Main Topics  . Smoking status: Current Every Day Smoker    Packs/day: 0.25    Years: 25.00    Types: Cigarettes  . Smokeless tobacco: Never Used     Comment: 2 per day   . Alcohol use No  . Drug use: No  . Sexual activity: Not on file   Other Topics Concern  . Not on file   Social History Narrative  . No narrative on file   Immunization History  Administered Date(s) Administered  .  Influenza,inj,Quad PF,36+ Mos 07/22/2016  . Pneumococcal Polysaccharide-23 08/29/2016  . Tdap 08/29/2016   Review of Systems  Constitutional: Negative for fatigue and unexpected weight change.  HENT: Negative.   Eyes: Negative for photophobia and visual disturbance.  Respiratory: Negative.   Cardiovascular: Positive for chest pain (periodically). Negative for palpitations and leg swelling.  Gastrointestinal: Negative.   Endocrine: Negative for polydipsia, polyphagia and polyuria.  Genitourinary: Negative.   Musculoskeletal: Negative.   Skin: Negative.   Allergic/Immunologic: Negative for immunocompromised state.  Neurological: Negative.   Hematological: Negative.   Psychiatric/Behavioral: Negative.        Objective:   Physical Exam  Constitutional: He is oriented to person, place, and time. He appears well-developed and well-nourished.  HENT:  Head: Normocephalic and atraumatic.  Right Ear: External ear normal.  Left Ear: External ear normal.  Nose: Nose normal.  Mouth/Throat: Oropharynx is clear and moist.  Eyes: Conjunctivae and EOM are normal. Pupils are equal, round, and reactive to light.  Neck: Normal range of motion. Neck supple.  Cardiovascular: Normal rate, regular rhythm, normal heart sounds and intact distal pulses.   Pulmonary/Chest: Effort normal and breath sounds normal.  Abdominal: Soft. Bowel sounds are normal.  Musculoskeletal:       Left knee: He exhibits decreased range of motion. He exhibits no swelling and no erythema.  Neurological: He is alert and oriented to person, place, and time. He has normal reflexes. Gait abnormal.  Skin:  Lipoma to left forehead and right arm. Raised, round, non tender to palpation.   Psychiatric: He has a normal mood and affect. His behavior is normal. Judgment and thought content normal.      BP 122/79 (BP Location: Left Arm, Patient Position: Sitting, Cuff Size: Large)   Pulse 66   Temp 98 F (36.7 C) (Oral)   Resp 18    Ht 5\' 11"  (1.803 m)   Wt 201 lb (91.2 kg)   SpO2 99%   BMI 28.03 kg/m  Assessment & Plan:  1. Uncontrolled type 2 diabetes mellitus with complication, with long-term current use of insulin (HCC) Hemoglobin a1c is 11.3. Will increase Levemir to 30 units in am and 20 units pm. Patient is to continue sliding scale Novolog for meal coverage.  - HgB A1c - insulin detemir (LEVEMIR) 100 UNIT/ML injection; INJECT 30  UNITS in the morning and 20 UNITS at bedtime  Dispense: 10 mL; Refill: 5  2. Essential hypertension Blood pressure is at goal on current medication regimen. Will continue medications at current dosage.   3. Neuropathy (HCC) - gabapentin (NEURONTIN) 300 MG capsule; Take 1 capsule (300 mg total) by mouth 3 (three) times daily.  Dispense: 90 capsule; Refill: 5  4. Screening for HIV (human immunodeficiency virus) - HIV antibody (with reflex)  5. Need for hepatitis C screening test - Hepatitis C antibody  6. Screening for colon cancer - Ambulatory referral to Gastroenterology   RTC:  1 month for DMII  Yoanna Jurczyk M, FNP   The patient was given clear instructions to go to ER or return to medical center if symptoms do not improve, worsen or new problems develop. The patient verbalized understanding.

## 2016-12-05 NOTE — ED Notes (Signed)
Patient transported to CT 

## 2016-12-05 NOTE — Discharge Instructions (Addendum)
Please increase your aspirin to 325 mg daily. Please follow-up with neurology for this episode of left leg numbness.

## 2016-12-06 LAB — HEPATITIS C ANTIBODY: HCV AB: NEGATIVE

## 2016-12-07 ENCOUNTER — Emergency Department (HOSPITAL_COMMUNITY)
Admission: EM | Admit: 2016-12-07 | Discharge: 2016-12-08 | Disposition: A | Discharge status: Home / Self Care | Payer: Medicaid Other | Attending: Emergency Medicine | Admitting: Emergency Medicine

## 2016-12-07 ENCOUNTER — Encounter (HOSPITAL_COMMUNITY): Payer: Self-pay

## 2016-12-07 DIAGNOSIS — Z794 Long term (current) use of insulin: Secondary | ICD-10-CM | POA: Diagnosis not present

## 2016-12-07 DIAGNOSIS — E119 Type 2 diabetes mellitus without complications: Secondary | ICD-10-CM | POA: Diagnosis not present

## 2016-12-07 DIAGNOSIS — F17210 Nicotine dependence, cigarettes, uncomplicated: Secondary | ICD-10-CM | POA: Diagnosis not present

## 2016-12-07 DIAGNOSIS — M545 Low back pain: Secondary | ICD-10-CM | POA: Diagnosis present

## 2016-12-07 DIAGNOSIS — I10 Essential (primary) hypertension: Secondary | ICD-10-CM | POA: Insufficient documentation

## 2016-12-07 DIAGNOSIS — Z7982 Long term (current) use of aspirin: Secondary | ICD-10-CM | POA: Insufficient documentation

## 2016-12-07 DIAGNOSIS — Z79899 Other long term (current) drug therapy: Secondary | ICD-10-CM | POA: Diagnosis not present

## 2016-12-07 LAB — HIV ANTIBODY (ROUTINE TESTING W REFLEX): HIV 1&2 Ab, 4th Generation: NONREACTIVE

## 2016-12-07 MED ORDER — CYCLOBENZAPRINE HCL 10 MG PO TABS: 10.0000 mg | ORAL_TABLET | Freq: Two times a day (BID) | ORAL | 0 refills | Status: DC | PRN

## 2016-12-07 MED ORDER — ACETAMINOPHEN 325 MG PO TABS
650.0000 mg | ORAL_TABLET | Freq: Once | ORAL | Status: AC
  Administered 2016-12-08: 650 mg via ORAL
  Filled 2016-12-07: qty 2

## 2016-12-07 NOTE — ED Triage Notes (Signed)
Pt complaining of lower back pain. Pt denies any new injury/trauma. Pt states hx of back surgery.

## 2016-12-07 NOTE — ED Provider Notes (Signed)
[facility] DEPT Provider Note    By signing my name below, I, Bea Graff, attest that this documentation has been prepared under the direction and in the presence of Mia McDonald, PA-C. Electronically Signed: Bea Graff, ED Scribe. 12/07/16. 11:09 PM.    History   Chief Complaint Chief Complaint  Patient presents with  . Back Pain   The history is provided by the patient and medical records. No language interpreter was used.    Ryan Medina is a 56 y.o. male with PMHx of chronic back pain, sciatica, DM, HLD, HTN who presents to the Emergency Department complaining of lower back pain, left side greater than right, that began earlier today. He reports associated sharp pain that radiates down the back of the LLE to his toes. He states his LLE has been "giving out" on him for the past two months and sometimes causes him to fall, however he denies falling today. He has not taken anything for pain relief. Sitting for long periods of time increases his pain. He denies alleviating factors. He denies numbness, tingling or weakness of the lower extremities, bowel or bladder incontinence, fever, chills, nausea, vomiting, abdominal pain, visual changes, CP, light headedness, dizziness, rash, upper extremity pain, trauma, injury or fall. He reports past surgical h/o back surgery stating he had a disc removed 10-20 years ago. He has an appt with a neurologist in one month (01/07/17) for recurrent HA. He was seen two days ago by his PCP and was prescribed Gabapentin after being seen here in the ED.    Past Medical History:  Diagnosis Date  . Back pain, chronic   . Diabetes ([place])   . HLD (hyperlipidemia)   . Hypertension   . IDDM (insulin dependent diabetes mellitus) ([place])   . Ruptured lumbar disc     Patient Active Problem List   Diagnosis Date Noted  . Other hyperlipidemia 11/11/2016  . Abnormal nuclear stress test 05/19/2016  . Atypical angina ([place]) 04/25/2016  . DM (diabetes  mellitus), type 2, uncontrolled ([place]) 03/14/2015  . Essential hypertension 03/14/2015  . Hyperglycemia 01/14/2015    Past Surgical History:  Procedure Laterality Date  . ABDOMINAL SURGERY    . APPENDECTOMY    . BACK SURGERY    . CARDIAC CATHETERIZATION N/A 05/19/2016   Procedure: Left Heart Cath and Coronary Angiography;  Surgeon: Leonie Man, MD;  Location: [facility];  Service: Cardiovascular;  Laterality: N/A;       Home Medications    Prior to Admission medications   Medication Sig Start Date End Date Taking? Authorizing Provider  amLODipine (NORVASC) 10 MG tablet Take 1 tablet (10 mg total) by mouth daily. 09/23/16   Dorena Dew, FNP  aspirin EC 325 MG tablet Take 1 tablet (325 mg total) by mouth daily. 12/05/16   Kristen N Newville, DO  atorvastatin (LIPITOR) 20 MG tablet Take 1 tablet (20 mg total) by mouth daily. 11/07/16   Dorena Dew, FNP  gabapentin (NEURONTIN) 300 MG capsule Take 1 capsule (300 mg total) by mouth 3 (three) times daily. 12/05/16   Dorena Dew, FNP  glucose blood (AGAMATRIX PRESTO TEST) test strip Check blood sugars prior to meals and at bedtime 11/07/16   Dorena Dew, FNP  hydrochlorothiazide (MICROZIDE) 12.5 MG capsule Take 1 capsule (12.5 mg total) by mouth daily. 11/12/16 02/10/17  Liliane Shi, PA-C  ibuprofen (ADVIL,MOTRIN) 200 MG tablet Take 200 mg by mouth every 6 (six) hours as needed for  moderate pain.    Historical Provider, MD  insulin aspart (NOVOLOG) 100 unit/mL injection Inject 1-10 Units into the skin 3 (three) times daily before meals. PER SLIDING SCALE    Historical Provider, MD  insulin detemir (LEVEMIR) 100 UNIT/ML injection INJECT 30  UNITS in the morning and 20 UNITS at bedtime 12/05/16   Dorena Dew, FNP  isosorbide mononitrate (IMDUR) 30 MG 24 hr tablet Take 1 tablet (30 mg total) by mouth daily. Patient not taking: Reported on 11/27/2016 11/12/16 02/10/17  Liliane Shi, PA-C  meloxicam (MOBIC) 15 MG tablet Take  1 tablet (15 mg total) by mouth daily. 09/23/16   Dorena Dew, FNP  metFORMIN (GLUCOPHAGE) 1000 MG tablet Take 1 tablet (1,000 mg total) by mouth 2 (two) times daily with a meal. 09/23/16   Dorena Dew, FNP  nitroGLYCERIN (NITROSTAT) 0.4 MG SL tablet Place 1 tablet (0.4 mg total) under the tongue every 5 (five) minutes as needed for chest pain (x 3 doses). 09/23/16   Dorena Dew, FNP  ondansetron (ZOFRAN ODT) 8 MG disintegrating tablet Take 1 tablet (8 mg total) by mouth every 8 (eight) hours as needed for nausea or vomiting. 11/27/16   Ermalinda Joubert, PA-C  ondansetron (ZOFRAN) 4 MG tablet Take 1 tablet (4 mg total) by mouth every 6 (six) hours. 11/29/16   Virgel Manifold, MD  pantoprazole (PROTONIX) 20 MG tablet Take 1 tablet (20 mg total) by mouth daily. 11/29/16   Virgel Manifold, MD    Family History Family History  Problem Relation Age of Onset  . Diabetes Mother   . Heart failure Mother   . Diabetes Sister   . Diabetes Brother     Social History Social History  Substance Use Topics  . Smoking status: Current Every Day Smoker    Packs/day: 0.25    Years: 25.00    Types: Cigarettes  . Smokeless tobacco: Never Used     Comment: 2 per day   . Alcohol use No     Allergies   Lisinopril   Review of Systems Review of Systems  Constitutional: Negative for chills and fever.  Eyes: Negative for visual disturbance.  Cardiovascular: Negative for chest pain.  Gastrointestinal: Negative for abdominal pain, nausea and vomiting.       No bowel or bladder incontinence  Musculoskeletal: Positive for back pain.  Skin: Negative for rash.  Neurological: Negative for dizziness, weakness, light-headedness and numbness.     Physical Exam Updated Vital Signs BP (!) 133/96 (BP Location: Left Arm)   Pulse 67   Temp 98 F (36.7 C) (Oral)   Resp 17   Ht 5\' 11"  (1.803 m)   Wt 197 lb 3 oz (89.4 kg)   SpO2 99%   BMI 27.50 kg/m   Physical Exam  Constitutional: He is oriented to  person, place, and time. He appears well-developed and well-nourished.  HENT:  Head: Normocephalic and atraumatic.  Neck: Normal range of motion.  Cardiovascular: Normal rate.   Pulmonary/Chest: Effort normal.  Musculoskeletal: Normal range of motion.  TTP in left SI joint and left buttock. Pain with left straight leg raise. No midline tenderness over thoracic or lumbar spine.   Neurological: He is alert and oriented to person, place, and time.  5/5 and equal lower extremity strength. 2+ and equal patellar reflexes bilaterally. Pt able to dorsiflex bilateral toes and feet with good strength against resistance. Equal sensation bilaterally over thighs and lower legs.   Skin: Skin is warm  and dry.  Psychiatric: He has a normal mood and affect. His behavior is normal.  Nursing note and vitals reviewed.    ED Treatments / Results  DIAGNOSTIC STUDIES: Oxygen Saturation is 99% on RA, normal by my interpretation.   COORDINATION OF CARE: 11:07 PM- Pt verbalizes understanding and agrees to plan.  Medications - No data to display  Labs (all labs ordered are listed, but only abnormal results are displayed) Labs Reviewed - No data to display  EKG  EKG Interpretation None       Radiology No results found.  Procedures Procedures (including critical care time)  Medications Ordered in ED Medications - No data to display   Initial Impression / Assessment and Plan / ED Course  I have reviewed the triage vital signs and the nursing notes.  Pertinent labs & imaging results that were available during my care of the patient were reviewed by me and considered in my medical decision making (see chart for details).     Patient in the emergency department with ongoing left-sided radicular pain. He has no evidence of cauda equina based on exam and symptoms. Strength and sensation intact in bilateral lower extremities. He is ambulatory. He is here for pain control. He drove himself here and  does not have a ride home. He does not want Toradol shot, he was given the last time he was in emergency department and states it did not help. Discussed continue Robaxin which she has at home. Will add naproxen and tramadol for severe pain. He is to call his doctor in follow-up. Return precautions discussed. No acute injuries, do not think he needs any further imaging in emergency department.  Vitals:   12/07/16 2230  BP: (!) 133/96  Pulse: 67  Resp: 17  Temp: 98 F (36.7 C)  TempSrc: Oral  SpO2: 99%  Weight: 89.4 kg  Height: 5\' 11"  (1.803 m)    I personally performed the services described in this documentation, which was scribed in my presence. The recorded information has been reviewed and is accurate.     Final Clinical Impressions(s) / ED Diagnoses   Final diagnoses:  Bilateral low back pain, unspecified chronicity, with sciatica presence unspecified    New Prescriptions New Prescriptions   No medications on file     Jeannett Senior, PA-C 12/08/16 0004    Quintella Reichert, MD 12/08/16 [phone]

## 2016-12-08 NOTE — ED Notes (Signed)
PT states understanding of care given, follow up care, and medication prescribed. PT ambulated from ED to car with a steady gait. 

## 2016-12-11 ENCOUNTER — Emergency Department (HOSPITAL_COMMUNITY)
Admission: EM | Admit: 2016-12-11 | Discharge: 2016-12-12 | Disposition: A | Discharge status: Home / Self Care | Payer: Medicaid Other | Attending: Emergency Medicine | Admitting: Emergency Medicine

## 2016-12-11 ENCOUNTER — Encounter (HOSPITAL_COMMUNITY): Payer: Self-pay

## 2016-12-11 DIAGNOSIS — I10 Essential (primary) hypertension: Secondary | ICD-10-CM | POA: Diagnosis not present

## 2016-12-11 DIAGNOSIS — Z79899 Other long term (current) drug therapy: Secondary | ICD-10-CM | POA: Diagnosis not present

## 2016-12-11 DIAGNOSIS — Z7982 Long term (current) use of aspirin: Secondary | ICD-10-CM | POA: Diagnosis not present

## 2016-12-11 DIAGNOSIS — F17210 Nicotine dependence, cigarettes, uncomplicated: Secondary | ICD-10-CM | POA: Insufficient documentation

## 2016-12-11 DIAGNOSIS — E11649 Type 2 diabetes mellitus with hypoglycemia without coma: Secondary | ICD-10-CM | POA: Insufficient documentation

## 2016-12-11 DIAGNOSIS — E162 Hypoglycemia, unspecified: Secondary | ICD-10-CM

## 2016-12-11 DIAGNOSIS — Z794 Long term (current) use of insulin: Secondary | ICD-10-CM | POA: Insufficient documentation

## 2016-12-11 DIAGNOSIS — R42 Dizziness and giddiness: Secondary | ICD-10-CM | POA: Diagnosis present

## 2016-12-11 LAB — CBC
HCT: 37.9 % — ABNORMAL LOW (ref 39.0–52.0)
Hemoglobin: 12.4 g/dL — ABNORMAL LOW (ref 13.0–17.0)
MCH: 28.7 pg (ref 26.0–34.0)
MCHC: 32.7 g/dL (ref 30.0–36.0)
MCV: 87.7 fL (ref 78.0–100.0)
PLATELETS: 261 10*3/uL (ref 150–400)
RBC: 4.32 MIL/uL (ref 4.22–5.81)
RDW: 12.7 % (ref 11.5–15.5)
WBC: 6.2 10*3/uL (ref 4.0–10.5)

## 2016-12-11 LAB — BASIC METABOLIC PANEL
Anion gap: 9 (ref 5–15)
BUN: 17 mg/dL (ref 6–20)
CHLORIDE: 106 mmol/L (ref 101–111)
CO2: 25 mmol/L (ref 22–32)
CREATININE: 1.06 mg/dL (ref 0.61–1.24)
Calcium: 8.9 mg/dL (ref 8.9–10.3)
GFR calc Af Amer: >60 mL/min (ref >60)
GFR calc non Af Amer: >60 mL/min (ref >60)
GLUCOSE: 235 mg/dL — AB (ref 65–99)
Potassium: 3.8 mmol/L (ref 3.5–5.1)
SODIUM: 140 mmol/L (ref 135–145)

## 2016-12-11 LAB — CBG MONITORING, ED: Glucose-Capillary: 237 mg/dL — ABNORMAL HIGH (ref 65–99)

## 2016-12-11 LAB — URINALYSIS, ROUTINE W REFLEX MICROSCOPIC
BILIRUBIN URINE: NEGATIVE
Bacteria, UA: NONE SEEN
HGB URINE DIPSTICK: NEGATIVE
KETONES UR: NEGATIVE mg/dL
LEUKOCYTES UA: NEGATIVE
NITRITE: NEGATIVE
PH: 6 (ref 5.0–8.0)
Protein, ur: 30 mg/dL — AB
Specific Gravity, Urine: 1.025 (ref 1.005–1.030)

## 2016-12-11 NOTE — ED Triage Notes (Signed)
Pt reports sudden onset of weakness and dizziness that started this morning around 1000 and has not went away. He reports he has type 2 Diabetes and states he does not have all the equipment at home to check his blood sugar so he has not been taking his insulin.  Pt ambulatory, speaking clear complete sentences.

## 2016-12-12 ENCOUNTER — Ambulatory Visit (HOSPITAL_COMMUNITY): Payer: Medicaid Other | Attending: Cardiology

## 2016-12-12 ENCOUNTER — Other Ambulatory Visit: Payer: Self-pay

## 2016-12-12 DIAGNOSIS — I25119 Atherosclerotic heart disease of native coronary artery with unspecified angina pectoris: Secondary | ICD-10-CM | POA: Diagnosis present

## 2016-12-12 DIAGNOSIS — R0602 Shortness of breath: Secondary | ICD-10-CM | POA: Insufficient documentation

## 2016-12-12 LAB — ECHOCARDIOGRAM COMPLETE

## 2016-12-12 NOTE — ED Provider Notes (Signed)
[facility] DEPT Provider Note   CSN: [id number] Arrival date & time: 12/11/16  2135   By signing my name below, I, Delton Prairie, attest that this documentation has been prepared under the direction and in the presence of Everlene Balls, MD  Electronically Signed: Delton Prairie, ED Scribe. 12/12/16. 12:42 AM.   History   Chief Complaint Chief Complaint  Patient presents with  . Dizziness   The history is provided by the patient. No language interpreter was used.   HPI Comments:  Ryan Medina is a 56 y.o. male, with a PMHx of HTN, HLD and DM on insulin, who presents to the Emergency Department complaining of acute onset weakness which began yesterday. Pt also reports dizziness, lightheadedness and blurry vision. Pt notes his PCP increased his morning dose of insulin from 20 units to 30 units on 12/05/2016. He states he additionally takes 20 units at night. Pt states he is not able to check his sugars on a regular basis. No alleviating factors noted. Pt denies any other associated symptoms or any other complaints at this time. He was able to relieve his symptoms by getting fresh air outside.  He states he will be able to check his sugars starting tomorrow because his pharmacy will have more lancets.  Also he has a fu appt with his PCP on 4/16.  Past Medical History:  Diagnosis Date  . Back pain, chronic   . Diabetes ([place])   . HLD (hyperlipidemia)   . Hypertension   . IDDM (insulin dependent diabetes mellitus) ([place])   . Ruptured lumbar disc     Patient Active Problem List   Diagnosis Date Noted  . Other hyperlipidemia 11/11/2016  . Abnormal nuclear stress test 05/19/2016  . Atypical angina ([place]) 04/25/2016  . DM (diabetes mellitus), type 2, uncontrolled ([place]) 03/14/2015  . Essential hypertension 03/14/2015  . Hyperglycemia 01/14/2015    Past Surgical History:  Procedure Laterality Date  . ABDOMINAL SURGERY    . APPENDECTOMY    . BACK SURGERY    . CARDIAC CATHETERIZATION  N/A 05/19/2016   Procedure: Left Heart Cath and Coronary Angiography;  Surgeon: Leonie Man, MD;  Location: [facility];  Service: Cardiovascular;  Laterality: N/A;       Home Medications    Prior to Admission medications   Medication Sig Start Date End Date Taking? Authorizing Provider  amLODipine (NORVASC) 10 MG tablet Take 1 tablet (10 mg total) by mouth daily. 09/23/16   Dorena Dew, FNP  aspirin EC 325 MG tablet Take 1 tablet (325 mg total) by mouth daily. 12/05/16   Kristen N Earp, DO  atorvastatin (LIPITOR) 20 MG tablet Take 1 tablet (20 mg total) by mouth daily. 11/07/16   Dorena Dew, FNP  cyclobenzaprine (FLEXERIL) 10 MG tablet Take 1 tablet (10 mg total) by mouth 2 (two) times daily as needed for muscle spasms. 12/07/16   Tatyana Kirichenko, PA-C  gabapentin (NEURONTIN) 300 MG capsule Take 1 capsule (300 mg total) by mouth 3 (three) times daily. 12/05/16   Dorena Dew, FNP  glucose blood (AGAMATRIX PRESTO TEST) test strip Check blood sugars prior to meals and at bedtime 11/07/16   Dorena Dew, FNP  hydrochlorothiazide (MICROZIDE) 12.5 MG capsule Take 1 capsule (12.5 mg total) by mouth daily. 11/12/16 02/10/17  Liliane Shi, PA-C  ibuprofen (ADVIL,MOTRIN) 200 MG tablet Take 200 mg by mouth every 6 (six) hours as needed for moderate pain.    Historical Provider, MD  insulin aspart (NOVOLOG) 100 unit/mL injection Inject 1-10 Units into the skin 3 (three) times daily before meals. PER SLIDING SCALE    Historical Provider, MD  insulin detemir (LEVEMIR) 100 UNIT/ML injection INJECT 30  UNITS in the morning and 20 UNITS at bedtime 12/05/16   Dorena Dew, FNP  isosorbide mononitrate (IMDUR) 30 MG 24 hr tablet Take 1 tablet (30 mg total) by mouth daily. Patient not taking: Reported on 11/27/2016 11/12/16 02/10/17  Liliane Shi, PA-C  meloxicam (MOBIC) 15 MG tablet Take 1 tablet (15 mg total) by mouth daily. 09/23/16   Dorena Dew, FNP  metFORMIN (GLUCOPHAGE) 1000  MG tablet Take 1 tablet (1,000 mg total) by mouth 2 (two) times daily with a meal. 09/23/16   Dorena Dew, FNP  nitroGLYCERIN (NITROSTAT) 0.4 MG SL tablet Place 1 tablet (0.4 mg total) under the tongue every 5 (five) minutes as needed for chest pain (x 3 doses). 09/23/16   Dorena Dew, FNP  ondansetron (ZOFRAN ODT) 8 MG disintegrating tablet Take 1 tablet (8 mg total) by mouth every 8 (eight) hours as needed for nausea or vomiting. 11/27/16   Tatyana Kirichenko, PA-C  ondansetron (ZOFRAN) 4 MG tablet Take 1 tablet (4 mg total) by mouth every 6 (six) hours. 11/29/16   Virgel Manifold, MD  pantoprazole (PROTONIX) 20 MG tablet Take 1 tablet (20 mg total) by mouth daily. 11/29/16   Virgel Manifold, MD    Family History Family History  Problem Relation Age of Onset  . Diabetes Mother   . Heart failure Mother   . Diabetes Sister   . Diabetes Brother     Social History Social History  Substance Use Topics  . Smoking status: Current Every Day Smoker    Packs/day: 0.25    Years: 25.00    Types: Cigarettes  . Smokeless tobacco: Never Used     Comment: 2 per day   . Alcohol use No     Allergies   Lisinopril   Review of Systems Review of Systems 10 Systems reviewed and are negative for acute change except as noted in the HPI.  Physical Exam Updated Vital Signs BP (!) 132/97 (BP Location: Right Arm)   Pulse 69   Temp 98.4 F (36.9 C) (Oral)   Resp 17   SpO2 97%   Physical Exam  Constitutional: He is oriented to person, place, and time. Vital signs are normal. He appears well-developed and well-nourished.  Non-toxic appearance. He does not appear ill. No distress.  HENT:  Head: Normocephalic and atraumatic.  Nose: Nose normal.  Mouth/Throat: Oropharynx is clear and moist. No oropharyngeal exudate.  Eyes: Conjunctivae and EOM are normal. Pupils are equal, round, and reactive to light. No scleral icterus.  Neck: Normal range of motion. Neck supple. No tracheal deviation, no edema,  no erythema and normal range of motion present. No thyroid mass and no thyromegaly present.  Cardiovascular: Normal rate, regular rhythm, S1 normal, S2 normal, normal heart sounds, intact distal pulses and normal pulses.  Exam reveals no gallop and no friction rub.   No murmur heard. Pulmonary/Chest: Effort normal and breath sounds normal. No respiratory distress. He has no wheezes. He has no rhonchi. He has no rales.  Abdominal: Soft. Normal appearance and bowel sounds are normal. He exhibits no distension, no ascites and no mass. There is no hepatosplenomegaly. There is no tenderness. There is no rebound, no guarding and no CVA tenderness.  Musculoskeletal: Normal range of motion. He exhibits  no edema or tenderness.  Lymphadenopathy:    He has no cervical adenopathy.  Neurological: He is alert and oriented to person, place, and time. He has normal strength. No cranial nerve deficit or sensory deficit.  Normal strength and sensation in all extremities.  Normal cerebellar testing.  Skin: Skin is warm, dry and intact. No petechiae and no rash noted. He is not diaphoretic. No erythema. No pallor.  Nursing note and vitals reviewed.    ED Treatments / Results  DIAGNOSTIC STUDIES:  Oxygen Saturation is 97% on RA, normal by my interpretation.    COORDINATION OF CARE:  12:36 AM Discussed treatment plan with pt at bedside and pt agreed to plan.  Labs (all labs ordered are listed, but only abnormal results are displayed) Labs Reviewed  BASIC METABOLIC PANEL - Abnormal; Notable for the following:       Result Value   Glucose, Bld 235 (*)    All other components within normal limits  CBC - Abnormal; Notable for the following:    Hemoglobin 12.4 (*)    HCT 37.9 (*)    All other components within normal limits  URINALYSIS, ROUTINE W REFLEX MICROSCOPIC - Abnormal; Notable for the following:    Glucose, UA >=500 (*)    Protein, ur 30 (*)    Squamous Epithelial / LPF 0-5 (*)    All other  components within normal limits  CBG MONITORING, ED - Abnormal; Notable for the following:    Glucose-Capillary 237 (*)    All other components within normal limits    EKG  EKG Interpretation  Date/Time:  Thursday December 11 2016 21:39:06 EDT Ventricular Rate:  75 PR Interval:  156 QRS Duration: 84 QT Interval:  342 QTC Calculation: 381 R Axis:   44 Text Interpretation:  Normal sinus rhythm T wave abnormality, consider inferolateral ischemia Abnormal ECG No significant change since last tracing Confirmed by Glynn Octave [phone]) on 12/12/2016 12:50:20 AM       Radiology No results found.  Procedures Procedures (including critical care time)  Medications Ordered in ED Medications - No data to display   Initial Impression / Assessment and Plan / ED Course  I have reviewed the triage vital signs and the nursing notes.  Pertinent labs & imaging results that were available during my care of the patient were reviewed by me and considered in my medical decision making (see chart for details).     Patient presents to the ED for dizzines and light headedness.  Likely due to hypoglycemia given his history.  Current CBG is 230s.  Symptoms have resolved. HE was advised to go back down to 20units in the morning and obtain his lancets ASAP tomorrow.  Call his PCP for a closer appt and further recs on insulin dosing. He demonstrates good understanding of the plan. Labs and EKG are otherwise unremarkable. He appears well and in NAD. VS remain within his normal limits and he is safe for DC.     Final Clinical Impressions(s) / ED Diagnoses   Final diagnoses:  None    New Prescriptions New Prescriptions   No medications on file    I personally performed the services described in this documentation, which was scribed in my presence. The recorded information has been reviewed and is accurate.       Everlene Balls, MD 12/12/16 [phone]

## 2016-12-14 ENCOUNTER — Encounter: Payer: Self-pay | Admitting: Physician Assistant

## 2016-12-14 DIAGNOSIS — I420 Dilated cardiomyopathy: Secondary | ICD-10-CM | POA: Insufficient documentation

## 2016-12-15 ENCOUNTER — Telehealth: Payer: Self-pay | Admitting: *Deleted

## 2016-12-15 NOTE — Telephone Encounter (Signed)
Pt notified of echo results by phone with verbal understanding. I will fax a copy of results to Cammie Sickle, [place]. Pt thanked me for the call today.

## 2016-12-16 ENCOUNTER — Emergency Department (HOSPITAL_COMMUNITY): Payer: Medicaid Other

## 2016-12-16 ENCOUNTER — Other Ambulatory Visit: Payer: Self-pay

## 2016-12-16 ENCOUNTER — Encounter (HOSPITAL_COMMUNITY): Payer: Self-pay

## 2016-12-16 DIAGNOSIS — I10 Essential (primary) hypertension: Secondary | ICD-10-CM | POA: Diagnosis not present

## 2016-12-16 DIAGNOSIS — Z79899 Other long term (current) drug therapy: Secondary | ICD-10-CM | POA: Insufficient documentation

## 2016-12-16 DIAGNOSIS — Z7982 Long term (current) use of aspirin: Secondary | ICD-10-CM | POA: Diagnosis not present

## 2016-12-16 DIAGNOSIS — F17210 Nicotine dependence, cigarettes, uncomplicated: Secondary | ICD-10-CM | POA: Diagnosis not present

## 2016-12-16 DIAGNOSIS — E109 Type 1 diabetes mellitus without complications: Secondary | ICD-10-CM | POA: Insufficient documentation

## 2016-12-16 DIAGNOSIS — R0602 Shortness of breath: Secondary | ICD-10-CM | POA: Insufficient documentation

## 2016-12-16 MED ORDER — ALBUTEROL SULFATE (2.5 MG/3ML) 0.083% IN NEBU
INHALATION_SOLUTION | RESPIRATORY_TRACT | Status: AC
  Administered 2016-12-16: 5 mg via RESPIRATORY_TRACT
  Filled 2016-12-16: qty 6

## 2016-12-16 MED ORDER — ALBUTEROL SULFATE (2.5 MG/3ML) 0.083% IN NEBU
5.0000 mg | INHALATION_SOLUTION | Freq: Once | RESPIRATORY_TRACT | Status: AC
  Administered 2016-12-16: 5 mg via RESPIRATORY_TRACT

## 2016-12-16 NOTE — ED Triage Notes (Signed)
Pt endorses shob that began 1 hour pta. Denies CP. Pt able to speak in complete sentences and appears to be in NAD. VSS.

## 2016-12-17 ENCOUNTER — Emergency Department (HOSPITAL_COMMUNITY)
Admission: EM | Admit: 2016-12-17 | Discharge: 2016-12-17 | Disposition: A | Discharge status: Home / Self Care | Payer: Medicaid Other | Attending: Emergency Medicine | Admitting: Emergency Medicine

## 2016-12-17 ENCOUNTER — Ambulatory Visit: Payer: Self-pay | Admitting: Physician Assistant

## 2016-12-17 ENCOUNTER — Encounter: Payer: Self-pay | Admitting: Physician Assistant

## 2016-12-17 DIAGNOSIS — R0602 Shortness of breath: Secondary | ICD-10-CM

## 2016-12-17 LAB — I-STAT CHEM 8, ED
BUN: 17 mg/dL (ref 6–20)
CREATININE: 0.9 mg/dL (ref 0.61–1.24)
Calcium, Ion: 1.18 mmol/L (ref 1.15–1.40)
Chloride: 100 mmol/L — ABNORMAL LOW (ref 101–111)
Glucose, Bld: 242 mg/dL — ABNORMAL HIGH (ref 65–99)
HEMATOCRIT: 37 % — AB (ref 39.0–52.0)
HEMOGLOBIN: 12.6 g/dL — AB (ref 13.0–17.0)
Potassium: 3.6 mmol/L (ref 3.5–5.1)
Sodium: 141 mmol/L (ref 135–145)
TCO2: 28 mmol/L (ref 0–100)

## 2016-12-17 LAB — I-STAT TROPONIN, ED: Troponin i, poc: 0.01 ng/mL (ref 0.00–0.08)

## 2016-12-17 NOTE — Progress Notes (Deleted)
Cardiology Office Note:    Date:  12/17/2016   ID:  Ryan Medina, DOB 1961-03-23, MRN [id number]  PCP:  Dorena Dew, FNP  Cardiologist:  Dr. Fransico Him   Electrophysiologist:  n/a  Referring MD: Dorena Dew, FNP   No chief complaint on file. ***  History of Present Illness:    Ryan Medina is a 55 y.o. male with a hx of chest pain, minimal non-obstructive CAD, HTN, HL, DM2.  He underwent a stress perfusion study in 8/17 after a visit to the ED with chest pain. The Myoview was high risk with EF 36% and large inferior defect.  A LHC demonstrated no significant CAD (mRCA 35%) and normal ejection fraction.  I saw him in follow up in 2/18.  He complained of shortness of breath as well as L sided chest pain that was NTG responsive.  I placed him on Isosorbide for management of possible microvascular angina.  I set him up for BNP that was normal.  An echo was obtained and demonstrated EF 40-98 and mild diastolic dysfunction.    He has had several visits see emergency room since last seen. Most recently, he went early this morning with complaints of shortness of breath. Chest x-ray was unremarkable.  He returns for follow up.  ***  Prior CV studies:   The following studies were reviewed today:  Echo 2/18:  EF 45-50, diff HK, Gr 1 DD, trivial AI/TR  LHC 8/17 RCA mid 35 EF 55-65 No angiographic evidence of any significant disease to cause a high risk stress test with inferior ischemia. There is streaming of flow down the very large caliber RCA, and may be a 40% stenosis but no more than that.   Myoview 8/17 EF 36, fixed large inferior defect with peri-infarct ischemia; high risk     Past Medical History:  Diagnosis Date  . Back pain, chronic   . Diabetes ([place])   . Dilated cardiomyopathy ([place])    Echo 2/18: EF 45-50, diff HK, Gr 1 DD, trivial AI/TR  . HLD (hyperlipidemia)   . Hypertension   . IDDM (insulin dependent diabetes mellitus) ([place])   . Ruptured lumbar  disc     Past Surgical History:  Procedure Laterality Date  . ABDOMINAL SURGERY    . APPENDECTOMY    . BACK SURGERY    . CARDIAC CATHETERIZATION N/A 05/19/2016   Procedure: Left Heart Cath and Coronary Angiography;  Surgeon: Leonie Man, MD;  Location: [facility];  Service: Cardiovascular;  Laterality: N/A;    Current Medications: No outpatient prescriptions have been marked as taking for the 12/17/16 encounter (Appointment) with Liliane Shi, PA-C.   Current Facility-Administered Medications for the 12/17/16 encounter (Appointment) with Liliane Shi, PA-C  Medication  . insulin lispro protamine-lispro (HUMALOG 50/50 MIX) (50-50) 100 UNIT/ML injection 10 Units     Allergies:   Lisinopril   Social History   Social History  . Marital status: Single    Spouse name: N/A  . Number of children: N/A  . Years of education: N/A   Social History Main Topics  . Smoking status: Current Every Day Smoker    Packs/day: 0.25    Years: 25.00    Types: Cigarettes  . Smokeless tobacco: Never Used     Comment: 2 per day   . Alcohol use No  . Drug use: No  . Sexual activity: Not on file   Other Topics Concern  . Not on file  Social History Narrative  . No narrative on file     Family History  Problem Relation Age of Onset  . Diabetes Mother   . Heart failure Mother   . Diabetes Sister   . Diabetes Brother      ROS:   Please see the history of present illness.    ROS All other systems reviewed and are negative.   EKGs/Labs/Other Test Reviewed:    EKG:  EKG is *** ordered today.  The ekg ordered today demonstrates ***  Recent Labs: 03/22/2016: Magnesium 1.9 11/12/2016: NT-Pro BNP 17 11/26/2016: ALT 24 12/11/2016: Platelets 261 12/17/2016: BUN 17; Creatinine, Ser 0.90; Hemoglobin 12.6; Potassium 3.6; Sodium 141   Recent Lipid Panel    Component Value Date/Time   CHOL 210 (H) 11/07/2016 0902   TRIG 77 11/07/2016 0902   HDL 47 11/07/2016 0902   CHOLHDL 4.5  11/07/2016 0902   VLDL 15 11/07/2016 0902   LDLCALC 148 (H) 11/07/2016 0902     Physical Exam:    VS:  There were no vitals taken for this visit.    Wt Readings from Last 3 Encounters:  12/16/16 197 lb (89.4 kg)  12/07/16 197 lb 3 oz (89.4 kg)  12/05/16 201 lb (91.2 kg)     ***Physical Exam  ASSESSMENT:    No diagnosis found. PLAN:    In order of problems listed above:  No diagnosis found.***   1. CAD with Angina - He describes exertional L sided chest pain that is NTG responsive. His LHC in 8/17 demonstrated no sig CAD.  He did have minor plaque in the RCA with ~ 35% stenosis.  This would not cause angina.  But, he is a diabetic and may have microvascular ischemia.  As his symptoms are responsive to NTG, I think he would benefit from long acting nitrates.  He does not take PDE-5 inhibitors.             -  Start Imdur 30 mg QD             -  Continue ASA, statin, amlodipine.     2. Dyspnea - His shortness of breath is most likely related to COPD with ongoing smoking.  But, he does have borderline LVH on his ECG.  His ejection fraction on nuclear study was low but his ejection fraction on LHC was normal.               -  Check BNP >> change HCTZ to Lasix if abnormal             -  Get echo to assess LVH, diastolic function   3. HTN - BP is well controlled.  Since I am starting Imdur, I will decrease HCTZ to 12.5 mg QD to avoid hypotension.    4. HL - LDL was 148 in 2/18.  PCP has restarted Lipitor.  I have encouraged him to start this.   5. Tobacco abuse - I have recommended cessation.    6. AAA - A recent MRI demonstrated a 3.2 cm AAA.  He sees [facility] in April for testing and follow up.     Dispo:  No Follow-up on file.   Medication Adjustments/Labs and Tests Ordered: Current medicines are reviewed at length with the patient today.  Concerns regarding medicines are outlined above.  Medication changes, Labs and Tests ordered today are outlined in the Patient Instructions  noted below. There are no Patient Instructions on file for this visit. Signed,  Richardson Dopp, PA-C  12/17/2016 2:29 PM    [facility], [place], [place]  [address] Phone: [phone]; Fax: [phone]

## 2016-12-17 NOTE — ED Provider Notes (Signed)
[facility] DEPT Provider Note   CSN: [id number] Arrival date & time: 12/16/16  2000     History   Chief Complaint Chief Complaint  Patient presents with  . Shortness of Breath    HPI Ryan Medina is a 56 y.o. male.   56 year old male with a history of chronic back pain, diabetes, dyslipidemia, hypertension presents to the emergency department for evaluation of shortness of breath. He states that symptoms began at approximately 1900 while he was in the grocery store. He reports shortness of breath lasting approximately 45 minutes before spontaneously resolving. He took 2 puffs of his albuterol without relief. He denies any shortness of breath at present. He had no associated chest pain. He denies fevers, syncope or near-syncope, nausea, vomiting, vertiginous symptoms, extremity numbness, or extremity weakness.     Past Medical History:  Diagnosis Date  . Back pain, chronic   . Diabetes ([place])   . Dilated cardiomyopathy ([place])    Echo 2/18: EF 45-50, diff HK, Gr 1 DD, trivial AI/TR  . HLD (hyperlipidemia)   . Hypertension   . IDDM (insulin dependent diabetes mellitus) ([place])   . Ruptured lumbar disc     Patient Active Problem List   Diagnosis Date Noted  . Dilated cardiomyopathy ([place])   . Other hyperlipidemia 11/11/2016  . Abnormal nuclear stress test 05/19/2016  . Atypical angina ([place]) 04/25/2016  . DM (diabetes mellitus), type 2, uncontrolled ([place]) 03/14/2015  . Essential hypertension 03/14/2015  . Hyperglycemia 01/14/2015    Past Surgical History:  Procedure Laterality Date  . ABDOMINAL SURGERY    . APPENDECTOMY    . BACK SURGERY    . CARDIAC CATHETERIZATION N/A 05/19/2016   Procedure: Left Heart Cath and Coronary Angiography;  Surgeon: Leonie Man, MD;  Location: [facility];  Service: Cardiovascular;  Laterality: N/A;       Home Medications    Prior to Admission medications   Medication Sig Start Date End Date Taking? Authorizing Provider    amLODipine (NORVASC) 10 MG tablet Take 1 tablet (10 mg total) by mouth daily. 09/23/16   Dorena Dew, FNP  aspirin EC 325 MG tablet Take 1 tablet (325 mg total) by mouth daily. 12/05/16   Kristen N Ailes, DO  atorvastatin (LIPITOR) 20 MG tablet Take 1 tablet (20 mg total) by mouth daily. 11/07/16   Dorena Dew, FNP  cyclobenzaprine (FLEXERIL) 10 MG tablet Take 1 tablet (10 mg total) by mouth 2 (two) times daily as needed for muscle spasms. 12/07/16   Tatyana Kirichenko, PA-C  gabapentin (NEURONTIN) 300 MG capsule Take 1 capsule (300 mg total) by mouth 3 (three) times daily. 12/05/16   Dorena Dew, FNP  glucose blood (AGAMATRIX PRESTO TEST) test strip Check blood sugars prior to meals and at bedtime 11/07/16   Dorena Dew, FNP  hydrochlorothiazide (MICROZIDE) 12.5 MG capsule Take 1 capsule (12.5 mg total) by mouth daily. 11/12/16 02/10/17  Liliane Shi, PA-C  ibuprofen (ADVIL,MOTRIN) 200 MG tablet Take 200 mg by mouth every 6 (six) hours as needed for moderate pain.    Historical Provider, MD  insulin aspart (NOVOLOG) 100 unit/mL injection Inject 1-10 Units into the skin 3 (three) times daily before meals. PER SLIDING SCALE    Historical Provider, MD  insulin detemir (LEVEMIR) 100 UNIT/ML injection INJECT 30  UNITS in the morning and 20 UNITS at bedtime 12/05/16   Dorena Dew, FNP  isosorbide mononitrate (IMDUR) 30 MG 24 hr tablet Take  1 tablet (30 mg total) by mouth daily. Patient not taking: Reported on 11/27/2016 11/12/16 02/10/17  Liliane Shi, PA-C  meloxicam (MOBIC) 15 MG tablet Take 1 tablet (15 mg total) by mouth daily. 09/23/16   Dorena Dew, FNP  metFORMIN (GLUCOPHAGE) 1000 MG tablet Take 1 tablet (1,000 mg total) by mouth 2 (two) times daily with a meal. 09/23/16   Dorena Dew, FNP  nitroGLYCERIN (NITROSTAT) 0.4 MG SL tablet Place 1 tablet (0.4 mg total) under the tongue every 5 (five) minutes as needed for chest pain (x 3 doses). 09/23/16   Dorena Dew, FNP   ondansetron (ZOFRAN ODT) 8 MG disintegrating tablet Take 1 tablet (8 mg total) by mouth every 8 (eight) hours as needed for nausea or vomiting. 11/27/16   Tatyana Kirichenko, PA-C  ondansetron (ZOFRAN) 4 MG tablet Take 1 tablet (4 mg total) by mouth every 6 (six) hours. 11/29/16   Virgel Manifold, MD  pantoprazole (PROTONIX) 20 MG tablet Take 1 tablet (20 mg total) by mouth daily. 11/29/16   Virgel Manifold, MD    Family History Family History  Problem Relation Age of Onset  . Diabetes Mother   . Heart failure Mother   . Diabetes Sister   . Diabetes Brother     Social History Social History  Substance Use Topics  . Smoking status: Current Every Day Smoker    Packs/day: 0.25    Years: 25.00    Types: Cigarettes  . Smokeless tobacco: Never Used     Comment: 2 per day   . Alcohol use No     Allergies   Lisinopril   Review of Systems Review of Systems Ten systems reviewed and are negative for acute change, except as noted in the HPI.    Physical Exam Updated Vital Signs BP 117/87   Pulse 73   Temp 98.4 F (36.9 C) (Oral)   Resp 14   Ht 5\' 11"  (1.803 m)   Wt 89.4 kg   SpO2 98%   BMI 27.48 kg/m   Physical Exam  Constitutional: He is oriented to person, place, and time. He appears well-developed and well-nourished. No distress.  Nontoxic and in NAD  HENT:  Head: Normocephalic and atraumatic.  Eyes: Conjunctivae and EOM are normal. No scleral icterus.  Neck: Normal range of motion.  No JVD  Cardiovascular: Normal rate, regular rhythm and intact distal pulses.   Pulmonary/Chest: Effort normal. No respiratory distress. He has no wheezes. He has no rales.  Respirations even and unlabored. Lungs CTAB.  Musculoskeletal: Normal range of motion.  Neurological: He is alert and oriented to person, place, and time. He exhibits normal muscle tone. Coordination normal.  GCS 15. Patient moving all extremities.  Skin: Skin is warm and dry. No rash noted. He is not diaphoretic. No  erythema. No pallor.  Psychiatric: He has a normal mood and affect. His behavior is normal.  Nursing note and vitals reviewed.    ED Treatments / Results  Labs (all labs ordered are listed, but only abnormal results are displayed) Labs Reviewed  I-STAT CHEM 8, ED - Abnormal; Notable for the following:       Result Value   Chloride 100 (*)    Glucose, Bld 242 (*)    Hemoglobin 12.6 (*)    HCT 37.0 (*)    All other components within normal limits  I-STAT TROPOININ, ED    EKG  EKG Interpretation None       Radiology Dg Chest  2 View  Result Date: 12/16/2016 CLINICAL DATA:  Shortness of breath. Intermittent shortness of breath for 3 months. EXAM: CHEST  2 VIEW COMPARISON:  Most recent radiographs 12/05/2016, chest CT 10/14/2016 FINDINGS: The cardiomediastinal contours are unchanged with mild aortic tortuosity. Minimal fissural thickening of the anterior minor fissure is unchanged. Left lung base nodule corresponds to a calcified granuloma seen on prior CT. Streaky left lung base atelectasis or scarring. Mild chronic hyperinflation, stable. Pulmonary vasculature is normal. No consolidation, pleural effusion, or pneumothorax. No acute osseous abnormalities are seen. Bone island in the left lateral eighth rib. IMPRESSION: No acute abnormality or change from prior exams. Electronically Signed   By: Jeb Levering M.D.   On: 12/16/2016 21:12    Procedures Procedures (including critical care time)  Medications Ordered in ED Medications  albuterol (PROVENTIL) (2.5 MG/3ML) 0.083% nebulizer solution 5 mg (5 mg Nebulization Given 12/16/16 2028)     Initial Impression / Assessment and Plan / ED Course  I have reviewed the triage vital signs and the nursing notes.  Pertinent labs & imaging results that were available during my care of the patient were reviewed by me and considered in my medical decision making (see chart for details).     56 year old male presents to the emergency  department for complaints of shortness of breath. Shortness of breath lasted approximately one hour before spontaneously resolving. He reports onset of shortness of breath at 1900.   He has no complaints of shortness of breath at this time. He has clear lung sounds and no hypoxia. Patient also able to ambulate in the ED without hypoxia. Troponin is negative today and chest x-ray is clear. Laboratory workup is reassuring. H/H stable.  Patient well known to the emergency department with similar visits in the past. Low suspicion for emergent cause of symptoms. Given spontaneous resolution without worsening, I have instructed the patient to follow-up with his primary care doctor on an outpatient basis. Return precautions discussed and provided. Patient discharged in stable condition with no unaddressed concerns.   Final Clinical Impressions(s) / ED Diagnoses   Final diagnoses:  SOB (shortness of breath)    New Prescriptions New Prescriptions   No medications on file     Antonietta Breach, PA-C 12/17/16 0962    Sherwood Gambler, MD 12/20/16 1606

## 2016-12-17 NOTE — ED Notes (Addendum)
Pt pulse ox 96-98% on RA while ambulating. Pt denies SOB, dizziness, weakness, or difficulty walking. Pt with steady gait.

## 2016-12-22 ENCOUNTER — Encounter (HOSPITAL_COMMUNITY): Payer: Self-pay | Admitting: Emergency Medicine

## 2016-12-22 DIAGNOSIS — Z794 Long term (current) use of insulin: Secondary | ICD-10-CM | POA: Diagnosis not present

## 2016-12-22 DIAGNOSIS — E1165 Type 2 diabetes mellitus with hyperglycemia: Secondary | ICD-10-CM | POA: Diagnosis not present

## 2016-12-22 DIAGNOSIS — Z7982 Long term (current) use of aspirin: Secondary | ICD-10-CM | POA: Insufficient documentation

## 2016-12-22 DIAGNOSIS — F17210 Nicotine dependence, cigarettes, uncomplicated: Secondary | ICD-10-CM | POA: Diagnosis not present

## 2016-12-22 DIAGNOSIS — Z79899 Other long term (current) drug therapy: Secondary | ICD-10-CM | POA: Insufficient documentation

## 2016-12-22 DIAGNOSIS — I10 Essential (primary) hypertension: Secondary | ICD-10-CM | POA: Diagnosis not present

## 2016-12-22 DIAGNOSIS — R739 Hyperglycemia, unspecified: Secondary | ICD-10-CM | POA: Diagnosis present

## 2016-12-22 LAB — CBG MONITORING, ED: Glucose-Capillary: 274 mg/dL — ABNORMAL HIGH (ref 65–99)

## 2016-12-22 NOTE — ED Notes (Signed)
Writer called for triage room assignment, no response, will try again.

## 2016-12-22 NOTE — ED Triage Notes (Signed)
Pt states he feels dehydrated  Pt states he feels dry and weak  Pt states he drinks a lot of water but the more he drinks the more he wants  Pt states he lost his glucose meter but he is in the process of getting another  Pt says he has been taking his medication as prescribed

## 2016-12-23 ENCOUNTER — Emergency Department (HOSPITAL_COMMUNITY)
Admission: EM | Admit: 2016-12-23 | Discharge: 2016-12-23 | Disposition: A | Discharge status: Home / Self Care | Payer: Medicaid Other | Attending: Emergency Medicine | Admitting: Emergency Medicine

## 2016-12-23 DIAGNOSIS — R739 Hyperglycemia, unspecified: Secondary | ICD-10-CM

## 2016-12-23 LAB — CBC WITH DIFFERENTIAL/PLATELET
Basophils Absolute: 0 10*3/uL (ref 0.0–0.1)
Basophils Relative: 0 %
EOS ABS: 0.3 10*3/uL (ref 0.0–0.7)
EOS PCT: 4 %
HCT: 36.1 % — ABNORMAL LOW (ref 39.0–52.0)
HEMOGLOBIN: 12 g/dL — AB (ref 13.0–17.0)
Lymphocytes Relative: 40 %
Lymphs Abs: 2.7 10*3/uL (ref 0.7–4.0)
MCH: 29.5 pg (ref 26.0–34.0)
MCHC: 33.2 g/dL (ref 30.0–36.0)
MCV: 88.7 fL (ref 78.0–100.0)
Monocytes Absolute: 0.3 10*3/uL (ref 0.1–1.0)
Monocytes Relative: 5 %
NEUTROS PCT: 51 %
Neutro Abs: 3.4 10*3/uL (ref 1.7–7.7)
PLATELETS: 251 10*3/uL (ref 150–400)
RBC: 4.07 MIL/uL — AB (ref 4.22–5.81)
RDW: 13 % (ref 11.5–15.5)
WBC: 6.7 10*3/uL (ref 4.0–10.5)

## 2016-12-23 LAB — URINALYSIS, ROUTINE W REFLEX MICROSCOPIC
BACTERIA UA: NONE SEEN
BILIRUBIN URINE: NEGATIVE
Glucose, UA: >=500 mg/dL — AB
Hgb urine dipstick: NEGATIVE
Ketones, ur: NEGATIVE mg/dL
Leukocytes, UA: NEGATIVE
NITRITE: NEGATIVE
Protein, ur: NEGATIVE mg/dL
SPECIFIC GRAVITY, URINE: 1.027 (ref 1.005–1.030)
pH: 5 (ref 5.0–8.0)

## 2016-12-23 LAB — COMPREHENSIVE METABOLIC PANEL
ALBUMIN: 3.5 g/dL (ref 3.5–5.0)
ALT: 22 U/L (ref 17–63)
AST: 17 U/L (ref 15–41)
Alkaline Phosphatase: 67 U/L (ref 38–126)
Anion gap: 6 (ref 5–15)
BUN: 21 mg/dL — AB (ref 6–20)
CHLORIDE: 105 mmol/L (ref 101–111)
CO2: 28 mmol/L (ref 22–32)
CREATININE: 1.2 mg/dL (ref 0.61–1.24)
Calcium: 8.9 mg/dL (ref 8.9–10.3)
GFR calc Af Amer: >60 mL/min (ref >60)
GFR calc non Af Amer: >60 mL/min (ref >60)
GLUCOSE: 329 mg/dL — AB (ref 65–99)
Potassium: 3.9 mmol/L (ref 3.5–5.1)
SODIUM: 139 mmol/L (ref 135–145)
Total Bilirubin: 0.4 mg/dL (ref 0.3–1.2)
Total Protein: 6.5 g/dL (ref 6.5–8.1)

## 2016-12-23 LAB — CBG MONITORING, ED: Glucose-Capillary: 312 mg/dL — ABNORMAL HIGH (ref 65–99)

## 2016-12-23 MED ORDER — SODIUM CHLORIDE 0.9 % IV BOLUS (SEPSIS)
1000.0000 mL | Freq: Once | INTRAVENOUS | Status: AC
  Administered 2016-12-23: 1000 mL via INTRAVENOUS

## 2016-12-23 MED ORDER — INSULIN DETEMIR 100 UNIT/ML ~~LOC~~ SOLN
20.0000 [IU] | Freq: Every day | SUBCUTANEOUS | Status: DC
  Administered 2016-12-23: 20 [IU] via SUBCUTANEOUS
  Filled 2016-12-23: qty 0.2

## 2016-12-23 NOTE — Discharge Instructions (Signed)
Monitor your blood sugars regularly. Continue using home medications as directed. Follow-up with her primary care provider next week to discuss today's visit. Return to ER for new or worsening symptoms, any additional concerns.

## 2016-12-23 NOTE — ED Notes (Signed)
Patient verbalizes understanding of discharge instructions, home care and follow up care. Patient out of department at this time. 

## 2016-12-23 NOTE — ED Provider Notes (Signed)
[facility] DEPT Provider Note   CSN: [id number] Arrival date & time: 12/22/16  2245     History   Chief Complaint Chief Complaint  Patient presents with  . Hyperglycemia    HPI Ryan Medina is a 56 y.o. male.  The history is provided by the patient and medical records. No language interpreter was used.  Hyperglycemia  Associated symptoms: increased thirst   Associated symptoms: no abdominal pain, no chest pain, no dysuria, no fever, no nausea, no shortness of breath and no vomiting    Ryan Medina is a 56 y.o. male  with a PMH of DM, HTN, HLD who presents to the Emergency Department complaining of feeling dehydrated for the last 2-3 days. His glucometer has been broken and he feels as if his sugars have been running high. He has another glucometer ordered which should be getting in today or tomorrow, but was concerned that sugars were too high to wait. Takes Levemir BID and Novolog SSI but due to not having machine, has not been using as much sliding scale as usual. Denies abdominal pain, n/v, fevers, cough, congestion, urinary symptoms. Sugars usually run in the 300's.   Past Medical History:  Diagnosis Date  . Back pain, chronic   . Diabetes ([place])   . Dilated cardiomyopathy ([place])    Echo 2/18: EF 45-50, diff HK, Gr 1 DD, trivial AI/TR  . HLD (hyperlipidemia)   . Hypertension   . IDDM (insulin dependent diabetes mellitus) ([place])   . Ruptured lumbar disc     Patient Active Problem List   Diagnosis Date Noted  . Dilated cardiomyopathy ([place])   . Other hyperlipidemia 11/11/2016  . Abnormal nuclear stress test 05/19/2016  . Atypical angina ([place]) 04/25/2016  . DM (diabetes mellitus), type 2, uncontrolled ([place]) 03/14/2015  . Essential hypertension 03/14/2015  . Hyperglycemia 01/14/2015    Past Surgical History:  Procedure Laterality Date  . ABDOMINAL SURGERY    . APPENDECTOMY    . BACK SURGERY    . CARDIAC CATHETERIZATION N/A 05/19/2016   Procedure: Left Heart  Cath and Coronary Angiography;  Surgeon: Leonie Man, MD;  Location: [facility];  Service: Cardiovascular;  Laterality: N/A;       Home Medications    Prior to Admission medications   Medication Sig Start Date End Date Taking? Authorizing Provider  amLODipine (NORVASC) 10 MG tablet Take 1 tablet (10 mg total) by mouth daily. 09/23/16  Yes Dorena Dew, FNP  aspirin EC 325 MG tablet Take 1 tablet (325 mg total) by mouth daily. 12/05/16  Yes Kristen N Casher, DO  atorvastatin (LIPITOR) 20 MG tablet Take 1 tablet (20 mg total) by mouth daily. 11/07/16  Yes Dorena Dew, FNP  cyclobenzaprine (FLEXERIL) 10 MG tablet Take 1 tablet (10 mg total) by mouth 2 (two) times daily as needed for muscle spasms. 12/07/16  Yes Tatyana Kirichenko, PA-C  gabapentin (NEURONTIN) 300 MG capsule Take 1 capsule (300 mg total) by mouth 3 (three) times daily. 12/05/16  Yes Dorena Dew, FNP  hydrochlorothiazide (MICROZIDE) 12.5 MG capsule Take 1 capsule (12.5 mg total) by mouth daily. 11/12/16 02/10/17 Yes Scott T Weaver, PA-C  ibuprofen (ADVIL,MOTRIN) 200 MG tablet Take 200 mg by mouth every 6 (six) hours as needed for moderate pain.   Yes Historical Provider, MD  insulin aspart (NOVOLOG) 100 unit/mL injection Inject 1-10 Units into the skin 3 (three) times daily before meals. PER SLIDING SCALE   Yes Historical Provider,  MD  insulin detemir (LEVEMIR) 100 UNIT/ML injection INJECT 30  UNITS in the morning and 20 UNITS at bedtime 12/05/16  Yes Dorena Dew, FNP  meloxicam (MOBIC) 15 MG tablet Take 1 tablet (15 mg total) by mouth daily. 09/23/16  Yes Dorena Dew, FNP  metFORMIN (GLUCOPHAGE) 1000 MG tablet Take 1 tablet (1,000 mg total) by mouth 2 (two) times daily with a meal. 09/23/16  Yes Dorena Dew, FNP  nitroGLYCERIN (NITROSTAT) 0.4 MG SL tablet Place 1 tablet (0.4 mg total) under the tongue every 5 (five) minutes as needed for chest pain (x 3 doses). 09/23/16  Yes Dorena Dew, FNP    ondansetron (ZOFRAN ODT) 8 MG disintegrating tablet Take 1 tablet (8 mg total) by mouth every 8 (eight) hours as needed for nausea or vomiting. 11/27/16  Yes Tatyana Kirichenko, PA-C  glucose blood (AGAMATRIX PRESTO TEST) test strip Check blood sugars prior to meals and at bedtime 11/07/16   Dorena Dew, FNP  isosorbide mononitrate (IMDUR) 30 MG 24 hr tablet Take 1 tablet (30 mg total) by mouth daily. Patient not taking: Reported on 11/27/2016 11/12/16 02/10/17  Liliane Shi, PA-C  ondansetron (ZOFRAN) 4 MG tablet Take 1 tablet (4 mg total) by mouth every 6 (six) hours. Patient not taking: Reported on 12/23/2016 11/29/16   Virgel Manifold, MD  pantoprazole (PROTONIX) 20 MG tablet Take 1 tablet (20 mg total) by mouth daily. Patient not taking: Reported on 12/23/2016 11/29/16   Virgel Manifold, MD    Family History Family History  Problem Relation Age of Onset  . Diabetes Mother   . Heart failure Mother   . Diabetes Sister   . Diabetes Brother     Social History Social History  Substance Use Topics  . Smoking status: Current Every Day Smoker    Packs/day: 0.25    Years: 25.00    Types: Cigarettes  . Smokeless tobacco: Never Used     Comment: 2 per day   . Alcohol use No     Allergies   Lisinopril   Review of Systems Review of Systems  Constitutional: Negative for chills and fever.  HENT: Negative for congestion.   Eyes: Negative for visual disturbance.  Respiratory: Negative for cough and shortness of breath.   Cardiovascular: Negative for chest pain.  Gastrointestinal: Negative for abdominal pain, nausea and vomiting.  Endocrine: Positive for polydipsia.  Genitourinary: Negative for dysuria.  Musculoskeletal: Negative for back pain.  Neurological: Negative for headaches.     Physical Exam Updated Vital Signs BP 114/78 (BP Location: Left Arm)   Pulse (!) 56   Temp 98.2 F (36.8 C) (Oral)   Resp 16   Ht 5\' 11"  (1.803 m)   Wt 91.4 kg   SpO2 95%   BMI 28.09 kg/m    Physical Exam  Constitutional: He is oriented to person, place, and time. He appears well-developed and well-nourished. No distress.  HENT:  Head: Normocephalic and atraumatic.  Cardiovascular: Normal rate, regular rhythm and normal heart sounds.   No murmur heard. Pulmonary/Chest: Effort normal and breath sounds normal. No respiratory distress.  Abdominal: Soft. He exhibits no distension. There is no tenderness.  Musculoskeletal: He exhibits no edema.  Neurological: He is alert and oriented to person, place, and time.  Skin: Skin is warm and dry.  Nursing note and vitals reviewed.    ED Treatments / Results  Labs (all labs ordered are listed, but only abnormal results are displayed) Labs Reviewed  CBC  WITH DIFFERENTIAL/PLATELET - Abnormal; Notable for the following:       Result Value   RBC 4.07 (*)    Hemoglobin 12.0 (*)    HCT 36.1 (*)    All other components within normal limits  COMPREHENSIVE METABOLIC PANEL - Abnormal; Notable for the following:    Glucose, Bld 329 (*)    BUN 21 (*)    All other components within normal limits  URINALYSIS, ROUTINE W REFLEX MICROSCOPIC - Abnormal; Notable for the following:    Glucose, UA >=500 (*)    Squamous Epithelial / LPF 0-5 (*)    All other components within normal limits  CBG MONITORING, ED - Abnormal; Notable for the following:    Glucose-Capillary 274 (*)    All other components within normal limits  CBG MONITORING, ED - Abnormal; Notable for the following:    Glucose-Capillary 312 (*)    All other components within normal limits    EKG  EKG Interpretation None       Radiology No results found.  Procedures Procedures (including critical care time)  Medications Ordered in ED Medications  sodium chloride 0.9 % bolus 1,000 mL (0 mLs Intravenous Stopped 12/23/16 0349)  sodium chloride 0.9 % bolus 1,000 mL (0 mLs Intravenous Stopped 12/23/16 0500)     Initial Impression / Assessment and Plan / ED Course  I have  reviewed the triage vital signs and the nursing notes.  Pertinent labs & imaging results that were available during my care of the patient were reviewed by me and considered in my medical decision making (see chart for details).    Ryan Medina is a 56 y.o. male who presents to ED for concerns of hyperglycemia as he feels dehydrated and his glucometer is broken. He states that he does not need a new glucometer rx - he has ordered one and waiting for it to come in which should be any day now. Other than appearing a little dry, benign exam. VSS. Labs/urine reassuring with no signs of DKA. Patient rehydrated with 2L IVF. Home night dose of insulin given. Patient re-evaluated and feels improved. He feels comfortable with discharge to home. PCP follow up for further evaluation discussed, especially if glucometer is not in today. Reasons to return to ER discussed and all questions answered.    Final Clinical Impressions(s) / ED Diagnoses   Final diagnoses:  Hyperglycemia    New Prescriptions Discharge Medication List as of 12/23/2016  4:43 AM       Ozella Almond Ruminski, PA-C 12/23/16 [place], MD 12/24/16 [phone]

## 2016-12-23 NOTE — ED Notes (Signed)
Bed: [facility]3 Expected date:  Expected time:  Means of arrival:  Comments: 

## 2016-12-24 ENCOUNTER — Other Ambulatory Visit: Payer: Self-pay

## 2016-12-24 DIAGNOSIS — I714 Abdominal aortic aneurysm, without rupture, unspecified: Secondary | ICD-10-CM

## 2016-12-26 ENCOUNTER — Encounter (HOSPITAL_COMMUNITY): Payer: Self-pay | Admitting: *Deleted

## 2016-12-26 ENCOUNTER — Emergency Department (HOSPITAL_COMMUNITY)
Admission: EM | Admit: 2016-12-26 | Discharge: 2016-12-27 | Disposition: A | Discharge status: Home / Self Care | Payer: Medicaid Other | Attending: Emergency Medicine | Admitting: Emergency Medicine

## 2016-12-26 DIAGNOSIS — E1165 Type 2 diabetes mellitus with hyperglycemia: Secondary | ICD-10-CM | POA: Insufficient documentation

## 2016-12-26 DIAGNOSIS — Z760 Encounter for issue of repeat prescription: Secondary | ICD-10-CM

## 2016-12-26 DIAGNOSIS — R739 Hyperglycemia, unspecified: Secondary | ICD-10-CM

## 2016-12-26 DIAGNOSIS — F17210 Nicotine dependence, cigarettes, uncomplicated: Secondary | ICD-10-CM | POA: Insufficient documentation

## 2016-12-26 DIAGNOSIS — I10 Essential (primary) hypertension: Secondary | ICD-10-CM | POA: Insufficient documentation

## 2016-12-26 DIAGNOSIS — Z7982 Long term (current) use of aspirin: Secondary | ICD-10-CM | POA: Insufficient documentation

## 2016-12-26 DIAGNOSIS — Z794 Long term (current) use of insulin: Secondary | ICD-10-CM | POA: Insufficient documentation
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
## 2016-12-27 ENCOUNTER — Emergency Department (HOSPITAL_COMMUNITY)
Admission: EM | Admit: 2016-12-27 | Discharge: 2016-12-27 | Disposition: A | Discharge status: Home / Self Care | Payer: Medicaid Other | Source: Home / Self Care | Attending: Emergency Medicine | Admitting: Emergency Medicine

## 2016-12-27 ENCOUNTER — Encounter (HOSPITAL_COMMUNITY): Payer: Self-pay

## 2016-12-27 DIAGNOSIS — Z794 Long term (current) use of insulin: Secondary | ICD-10-CM | POA: Diagnosis not present

## 2016-12-27 DIAGNOSIS — I10 Essential (primary) hypertension: Secondary | ICD-10-CM | POA: Diagnosis not present

## 2016-12-27 DIAGNOSIS — Z7982 Long term (current) use of aspirin: Secondary | ICD-10-CM | POA: Diagnosis not present

## 2016-12-27 DIAGNOSIS — R739 Hyperglycemia, unspecified: Secondary | ICD-10-CM

## 2016-12-27 DIAGNOSIS — E1165 Type 2 diabetes mellitus with hyperglycemia: Secondary | ICD-10-CM | POA: Diagnosis present

## 2016-12-27 DIAGNOSIS — F17210 Nicotine dependence, cigarettes, uncomplicated: Secondary | ICD-10-CM | POA: Diagnosis not present

## 2016-12-27 LAB — I-STAT CHEM 8, ED
BUN: 16 mg/dL (ref 6–20)
Calcium, Ion: 1.2 mmol/L (ref 1.15–1.40)
Chloride: 101 mmol/L (ref 101–111)
Creatinine, Ser: 1.2 mg/dL (ref 0.61–1.24)
Glucose, Bld: 298 mg/dL — ABNORMAL HIGH (ref 65–99)
HEMATOCRIT: 36 % — AB (ref 39.0–52.0)
Hemoglobin: 12.2 g/dL — ABNORMAL LOW (ref 13.0–17.0)
Potassium: 3.9 mmol/L (ref 3.5–5.1)
SODIUM: 142 mmol/L (ref 135–145)
TCO2: 29 mmol/L (ref 0–100)

## 2016-12-27 LAB — CBG MONITORING, ED
GLUCOSE-CAPILLARY: 293 mg/dL — AB (ref 65–99)
Glucose-Capillary: 339 mg/dL — ABNORMAL HIGH (ref 65–99)
Glucose-Capillary: 379 mg/dL — ABNORMAL HIGH (ref 65–99)

## 2016-12-27 MED ORDER — SODIUM CHLORIDE 0.9 % IV BOLUS (SEPSIS)
1000.0000 mL | Freq: Once | INTRAVENOUS | Status: AC
  Administered 2016-12-27: 1000 mL via INTRAVENOUS

## 2016-12-27 MED ORDER — INSULIN ASPART 100 UNIT/ML ~~LOC~~ SOLN
10.0000 [IU] | Freq: Once | SUBCUTANEOUS | Status: AC
  Administered 2016-12-27: 10 [IU] via SUBCUTANEOUS
  Filled 2016-12-27: qty 1

## 2016-12-27 NOTE — ED Notes (Signed)
Pt understood dc material. NAD noted. 
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
## 2016-12-27 NOTE — Discharge Instructions (Signed)
Please call pharmacies tomorrow to see if any provide insulin. Please also call [facility] tomorrow to see if they may have your bag at lost and found. If you have any new or worsening symptoms please return for further evaluation and treatment.   Get help right away if: You have difficulty breathing. You have a change in how you think, feel, or act (mental status). You have nausea or vomiting that does not go away.

## 2016-12-27 NOTE — ED Provider Notes (Signed)
[facility] DEPT Provider Note   CSN: [id number] Arrival date & time: 12/27/16  2041     History   Chief Complaint Chief Complaint  Patient presents with  . Fatigue    HPI Ryan Medina is a 56 y.o. male.  Patient presents to the emergency department with chief complaint of hyperglycemia. He states that he has been out of his insulin. States that his blood sugars have been running in the 300s. He reports associated fatigue, polydipsia, and polyuria.  He denies any CP, SOB, abdominal pain, or dysuria.  There are no other associated symptoms.   The history is provided by the patient. No language interpreter was used.    Past Medical History:  Diagnosis Date  . Back pain, chronic   . Diabetes ([place])   . Dilated cardiomyopathy ([place])    Echo 2/18: EF 45-50, diff HK, Gr 1 DD, trivial AI/TR  . HLD (hyperlipidemia)   . Hypertension   . IDDM (insulin dependent diabetes mellitus) ([place])   . Ruptured lumbar disc     Patient Active Problem List   Diagnosis Date Noted  . Dilated cardiomyopathy ([place])   . Other hyperlipidemia 11/11/2016  . Abnormal nuclear stress test 05/19/2016  . Atypical angina ([place]) 04/25/2016  . DM (diabetes mellitus), type 2, uncontrolled ([place]) 03/14/2015  . Essential hypertension 03/14/2015  . Hyperglycemia 01/14/2015    Past Surgical History:  Procedure Laterality Date  . ABDOMINAL SURGERY    . APPENDECTOMY    . BACK SURGERY    . CARDIAC CATHETERIZATION N/A 05/19/2016   Procedure: Left Heart Cath and Coronary Angiography;  Surgeon: Leonie Man, MD;  Location: [facility];  Service: Cardiovascular;  Laterality: N/A;       Home Medications    Prior to Admission medications   Medication Sig Start Date End Date Taking? Authorizing Provider  amLODipine (NORVASC) 10 MG tablet Take 1 tablet (10 mg total) by mouth daily. 09/23/16   Dorena Dew, FNP  aspirin EC 325 MG tablet Take 1 tablet (325 mg total) by mouth daily. 12/05/16   Kristen N  [place], DO  atorvastatin (LIPITOR) 20 MG tablet Take 1 tablet (20 mg total) by mouth daily. 11/07/16   Dorena Dew, FNP  cyclobenzaprine (FLEXERIL) 10 MG tablet Take 1 tablet (10 mg total) by mouth 2 (two) times daily as needed for muscle spasms. 12/07/16   Tatyana Kirichenko, PA-C  gabapentin (NEURONTIN) 300 MG capsule Take 1 capsule (300 mg total) by mouth 3 (three) times daily. 12/05/16   Dorena Dew, FNP  glucose blood (AGAMATRIX PRESTO TEST) test strip Check blood sugars prior to meals and at bedtime 11/07/16   Dorena Dew, FNP  hydrochlorothiazide (MICROZIDE) 12.5 MG capsule Take 1 capsule (12.5 mg total) by mouth daily. 11/12/16 02/10/17  Liliane Shi, PA-C  ibuprofen (ADVIL,MOTRIN) 200 MG tablet Take 200 mg by mouth every 6 (six) hours as needed for moderate pain.    Historical Provider, MD  insulin aspart (NOVOLOG) 100 unit/mL injection Inject 1-10 Units into the skin 3 (three) times daily before meals. PER SLIDING SCALE    Historical Provider, MD  insulin detemir (LEVEMIR) 100 UNIT/ML injection INJECT 30  UNITS in the morning and 20 UNITS at bedtime 12/05/16   Dorena Dew, FNP  isosorbide mononitrate (IMDUR) 30 MG 24 hr tablet Take 1 tablet (30 mg total) by mouth daily. Patient not taking: Reported on 11/27/2016 11/12/16 02/10/17  Liliane Shi, PA-C  meloxicam (  MOBIC) 15 MG tablet Take 1 tablet (15 mg total) by mouth daily. 09/23/16   Dorena Dew, FNP  metFORMIN (GLUCOPHAGE) 1000 MG tablet Take 1 tablet (1,000 mg total) by mouth 2 (two) times daily with a meal. 09/23/16   Dorena Dew, FNP  nitroGLYCERIN (NITROSTAT) 0.4 MG SL tablet Place 1 tablet (0.4 mg total) under the tongue every 5 (five) minutes as needed for chest pain (x 3 doses). 09/23/16   Dorena Dew, FNP  ondansetron (ZOFRAN ODT) 8 MG disintegrating tablet Take 1 tablet (8 mg total) by mouth every 8 (eight) hours as needed for nausea or vomiting. 11/27/16   Tatyana Kirichenko, PA-C  ondansetron (ZOFRAN) 4 MG  tablet Take 1 tablet (4 mg total) by mouth every 6 (six) hours. Patient not taking: Reported on 12/23/2016 11/29/16   Virgel Manifold, MD  pantoprazole (PROTONIX) 20 MG tablet Take 1 tablet (20 mg total) by mouth daily. Patient not taking: Reported on 12/23/2016 11/29/16   Virgel Manifold, MD    Family History Family History  Problem Relation Age of Onset  . Diabetes Mother   . Heart failure Mother   . Diabetes Sister   . Diabetes Brother     Social History Social History  Substance Use Topics  . Smoking status: Current Every Day Smoker    Packs/day: 0.25    Years: 25.00    Types: Cigarettes  . Smokeless tobacco: Never Used     Comment: 2 per day   . Alcohol use No     Allergies   Lisinopril   Review of Systems Review of Systems  All other systems reviewed and are negative.    Physical Exam Updated Vital Signs BP 132/90 (BP Location: Right Arm)   Pulse 80   Temp 98.3 F (36.8 C) (Oral)   Resp 16   SpO2 98%   Physical Exam  Constitutional: He is oriented to person, place, and time. He appears well-developed and well-nourished.  HENT:  Head: Normocephalic and atraumatic.  Eyes: Conjunctivae and EOM are normal. Pupils are equal, round, and reactive to light. Right eye exhibits no discharge. Left eye exhibits no discharge. No scleral icterus.  Neck: Normal range of motion. Neck supple. No JVD present.  Cardiovascular: Normal rate, regular rhythm and normal heart sounds.  Exam reveals no gallop and no friction rub.   No murmur heard. Pulmonary/Chest: Effort normal and breath sounds normal. No respiratory distress. He has no wheezes. He has no rales. He exhibits no tenderness.  Abdominal: Soft. He exhibits no distension and no mass. There is no tenderness. There is no rebound and no guarding.  Musculoskeletal: Normal range of motion. He exhibits no edema or tenderness.  Neurological: He is alert and oriented to person, place, and time.  Skin: Skin is warm and dry.    Psychiatric: He has a normal mood and affect. His behavior is normal. Judgment and thought content normal.  Nursing note and vitals reviewed.    ED Treatments / Results  Labs (all labs ordered are listed, but only abnormal results are displayed) Labs Reviewed  CBG MONITORING, ED - Abnormal; Notable for the following:       Result Value   Glucose-Capillary 339 (*)    All other components within normal limits  I-STAT CHEM 8, ED - Abnormal; Notable for the following:    Glucose, Bld 298 (*)    Hemoglobin 12.2 (*)    HCT 36.0 (*)    All other components within normal  limits    EKG  EKG Interpretation None       Radiology No results found.  Procedures Procedures (including critical care time)  Medications Ordered in ED Medications  sodium chloride 0.9 % bolus 1,000 mL (1,000 mLs Intravenous New Bag/Given 12/27/16 2221)  insulin aspart (novoLOG) injection 10 Units (10 Units Subcutaneous Given 12/27/16 2218)     Initial Impression / Assessment and Plan / ED Course  I have reviewed the triage vital signs and the nursing notes.  Pertinent labs & imaging results that were available during my care of the patient were reviewed by me and considered in my medical decision making (see chart for details).     Patient with hyperglycemia, out of meds.  Will check labs, give some insulin and fluids, and reassess.  Plan for discharge after fluids.  CBG trending down.  DC to home.  Continue to monitor CBG at home.  Offered prescription, patient states he will get his medicine from his pharmacy on Monday morning.  Final Clinical Impressions(s) / ED Diagnoses   Final diagnoses:  Hyperglycemia    New Prescriptions New Prescriptions   No medications on file     Montine Circle, PA-C 12/27/16 [place], MD 12/28/16 [phone]

## 2016-12-29 ENCOUNTER — Encounter: Payer: Self-pay | Admitting: Vascular Surgery

## 2016-12-30 ENCOUNTER — Encounter: Payer: Self-pay | Admitting: Physician Assistant

## 2017-01-05 ENCOUNTER — Ambulatory Visit: Payer: Self-pay | Admitting: Family Medicine

## 2017-01-07 ENCOUNTER — Encounter: Payer: Self-pay | Admitting: Vascular Surgery

## 2017-01-07 ENCOUNTER — Ambulatory Visit (HOSPITAL_COMMUNITY): Admission: RE | Admit: 2017-01-07 | Payer: Medicaid Other | Source: Ambulatory Visit

## 2017-01-08 ENCOUNTER — Encounter: Payer: Self-pay | Admitting: Family Medicine

## 2017-01-29 ENCOUNTER — Other Ambulatory Visit: Payer: Self-pay | Admitting: Family Medicine

## 2017-01-29 DIAGNOSIS — E1165 Type 2 diabetes mellitus with hyperglycemia: Secondary | ICD-10-CM

## 2017-01-29 DIAGNOSIS — E118 Type 2 diabetes mellitus with unspecified complications: Principal | ICD-10-CM

## 2017-01-29 DIAGNOSIS — IMO0002 Reserved for concepts with insufficient information to code with codable children: Secondary | ICD-10-CM

## 2017-03-17 ENCOUNTER — Encounter (HOSPITAL_COMMUNITY): Payer: Self-pay | Admitting: Vascular Surgery

## 2017-03-17 ENCOUNTER — Emergency Department (HOSPITAL_COMMUNITY): Payer: Medicaid Other

## 2017-03-17 ENCOUNTER — Emergency Department (HOSPITAL_COMMUNITY)
Admission: EM | Admit: 2017-03-17 | Discharge: 2017-03-17 | Disposition: A | Discharge status: Home / Self Care | Payer: Medicaid Other | Attending: Emergency Medicine | Admitting: Emergency Medicine

## 2017-03-17 DIAGNOSIS — F17210 Nicotine dependence, cigarettes, uncomplicated: Secondary | ICD-10-CM | POA: Diagnosis not present

## 2017-03-17 DIAGNOSIS — I119 Hypertensive heart disease without heart failure: Secondary | ICD-10-CM | POA: Diagnosis not present

## 2017-03-17 DIAGNOSIS — Z79899 Other long term (current) drug therapy: Secondary | ICD-10-CM | POA: Insufficient documentation

## 2017-03-17 DIAGNOSIS — E119 Type 2 diabetes mellitus without complications: Secondary | ICD-10-CM | POA: Diagnosis not present

## 2017-03-17 DIAGNOSIS — J0300 Acute streptococcal tonsillitis, unspecified: Secondary | ICD-10-CM | POA: Diagnosis not present

## 2017-03-17 DIAGNOSIS — Z7982 Long term (current) use of aspirin: Secondary | ICD-10-CM | POA: Insufficient documentation

## 2017-03-17 DIAGNOSIS — J039 Acute tonsillitis, unspecified: Secondary | ICD-10-CM

## 2017-03-17 DIAGNOSIS — Z794 Long term (current) use of insulin: Secondary | ICD-10-CM | POA: Insufficient documentation

## 2017-03-17 DIAGNOSIS — E785 Hyperlipidemia, unspecified: Secondary | ICD-10-CM | POA: Insufficient documentation

## 2017-03-17 DIAGNOSIS — I420 Dilated cardiomyopathy: Secondary | ICD-10-CM | POA: Diagnosis not present

## 2017-03-17 DIAGNOSIS — J029 Acute pharyngitis, unspecified: Secondary | ICD-10-CM | POA: Diagnosis present

## 2017-03-17 DIAGNOSIS — J020 Streptococcal pharyngitis: Secondary | ICD-10-CM

## 2017-03-17 LAB — CBC WITH DIFFERENTIAL/PLATELET
Basophils Absolute: 0 10*3/uL (ref 0.0–0.1)
Basophils Relative: 1 %
Eosinophils Absolute: 0.1 10*3/uL (ref 0.0–0.7)
Eosinophils Relative: 1 %
HEMATOCRIT: 44.8 % (ref 39.0–52.0)
HEMOGLOBIN: 15 g/dL (ref 13.0–17.0)
LYMPHS ABS: 1.9 10*3/uL (ref 0.7–4.0)
LYMPHS PCT: 22 %
MCH: 29.5 pg (ref 26.0–34.0)
MCHC: 33.5 g/dL (ref 30.0–36.0)
MCV: 88 fL (ref 78.0–100.0)
Monocytes Absolute: 0.9 10*3/uL (ref 0.1–1.0)
Monocytes Relative: 11 %
NEUTROS ABS: 5.8 10*3/uL (ref 1.7–7.7)
NEUTROS PCT: 65 %
Platelets: 234 10*3/uL (ref 150–400)
RBC: 5.09 MIL/uL (ref 4.22–5.81)
RDW: 12.5 % (ref 11.5–15.5)
WBC: 8.8 10*3/uL (ref 4.0–10.5)

## 2017-03-17 LAB — COMPREHENSIVE METABOLIC PANEL
ALK PHOS: 84 U/L (ref 38–126)
ALT: 16 U/L — AB (ref 17–63)
AST: 14 U/L — AB (ref 15–41)
Albumin: 3.3 g/dL — ABNORMAL LOW (ref 3.5–5.0)
Anion gap: 8 (ref 5–15)
BUN: 14 mg/dL (ref 6–20)
CALCIUM: 8.9 mg/dL (ref 8.9–10.3)
CO2: 25 mmol/L (ref 22–32)
CREATININE: 1.03 mg/dL (ref 0.61–1.24)
Chloride: 107 mmol/L (ref 101–111)
GFR calc Af Amer: >60 mL/min (ref >60)
GFR calc non Af Amer: >60 mL/min (ref >60)
Glucose, Bld: 295 mg/dL — ABNORMAL HIGH (ref 65–99)
Potassium: 4.1 mmol/L (ref 3.5–5.1)
SODIUM: 140 mmol/L (ref 135–145)
Total Bilirubin: 0.8 mg/dL (ref 0.3–1.2)
Total Protein: 6.7 g/dL (ref 6.5–8.1)

## 2017-03-17 LAB — RAPID STREP SCREEN (MED CTR MEBANE ONLY): Streptococcus, Group A Screen (Direct): POSITIVE — AB

## 2017-03-17 MED ORDER — CLINDAMYCIN PHOSPHATE 600 MG/50ML IV SOLN
600.0000 mg | Freq: Once | INTRAVENOUS | Status: AC
  Administered 2017-03-17: 600 mg via INTRAVENOUS
  Filled 2017-03-17: qty 50

## 2017-03-17 MED ORDER — SODIUM CHLORIDE 0.9 % IV BOLUS (SEPSIS)
1000.0000 mL | Freq: Once | INTRAVENOUS | Status: AC
  Administered 2017-03-17: 1000 mL via INTRAVENOUS

## 2017-03-17 MED ORDER — DEXAMETHASONE SODIUM PHOSPHATE 10 MG/ML IJ SOLN
10.0000 mg | Freq: Once | INTRAMUSCULAR | Status: AC
  Administered 2017-03-17: 10 mg via INTRAVENOUS
  Filled 2017-03-17: qty 1

## 2017-03-17 MED ORDER — MORPHINE SULFATE (PF) 4 MG/ML IV SOLN
4.0000 mg | Freq: Once | INTRAVENOUS | Status: AC
  Administered 2017-03-17: 4 mg via INTRAVENOUS
  Filled 2017-03-17: qty 1

## 2017-03-17 MED ORDER — IOPAMIDOL (ISOVUE-300) INJECTION 61%
INTRAVENOUS | Status: AC
  Administered 2017-03-17: 75 mL
  Filled 2017-03-17: qty 75

## 2017-03-17 MED ORDER — HYDROCODONE-ACETAMINOPHEN 5-325 MG PO TABS: 1.0000 | ORAL_TABLET | Freq: Four times a day (QID) | ORAL | 0 refills | Status: DC | PRN

## 2017-03-17 MED ORDER — CLINDAMYCIN HCL 300 MG PO CAPS: 300.0000 mg | ORAL_CAPSULE | Freq: Four times a day (QID) | ORAL | 0 refills | Status: DC

## 2017-03-17 NOTE — ED Notes (Signed)
Pt taking po fluids and tolerating well. 

## 2017-03-17 NOTE — ED Notes (Signed)
Lab state cmp tube has hemoilyzed and needs to be reordered and redrawn.

## 2017-03-17 NOTE — ED Notes (Signed)
Pt is talking witih more clear voice. Able to speak and swallow.

## 2017-03-17 NOTE — ED Triage Notes (Signed)
Pt reports to the ED for eval of sore throat, throat swelling, and difficulty swallowing x 3 days. He is unable to talk and states he cannot even swallow his saliva. Pt denies any hives or SOB. Airway intact at this time. Denies any new soaps, detergents, medications, or foods.

## 2017-03-17 NOTE — ED Provider Notes (Signed)
[facility] DEPT Provider Note   CSN: [id number] Arrival date & time: 03/17/17  5809     History   Chief Complaint Chief Complaint  Patient presents with  . Dysphagia  . Oral Swelling    HPI Ryan Medina is a 56 y.o. male history of diabetes, dilated cardiomyopathy, hypertension, hyperlipidemia here presenting with sore throat, throat swelling. Patient states that he has sore throat for the last 3 days and some trouble swallowing for the last 2 days. States that his throat is progressively more swollen and red. Denies any fevers or chills or new meds. He was on lisinopril but had anaphylaxis so has not been taking it for years. Denies any sick contacts.  The history is provided by the patient and a relative.    Past Medical History:  Diagnosis Date  . Back pain, chronic   . Diabetes ([place])   . Dilated cardiomyopathy ([place])    Echo 2/18: EF 45-50, diff HK, Gr 1 DD, trivial AI/TR  . HLD (hyperlipidemia)   . Hypertension   . IDDM (insulin dependent diabetes mellitus) ([place])   . Ruptured lumbar disc     Patient Active Problem List   Diagnosis Date Noted  . Dilated cardiomyopathy ([place])   . Other hyperlipidemia 11/11/2016  . Abnormal nuclear stress test 05/19/2016  . Atypical angina ([place]) 04/25/2016  . DM (diabetes mellitus), type 2, uncontrolled ([place]) 03/14/2015  . Essential hypertension 03/14/2015  . Hyperglycemia 01/14/2015    Past Surgical History:  Procedure Laterality Date  . ABDOMINAL SURGERY    . APPENDECTOMY    . BACK SURGERY    . CARDIAC CATHETERIZATION N/A 05/19/2016   Procedure: Left Heart Cath and Coronary Angiography;  Surgeon: Leonie Man, MD;  Location: [facility];  Service: Cardiovascular;  Laterality: N/A;       Home Medications    Prior to Admission medications   Medication Sig Start Date End Date Taking? Authorizing Provider  amLODipine (NORVASC) 10 MG tablet TAKE 1 TABLET BY MOUTH EVERY DAY 01/29/17  Yes Dorena Dew, FNP    ASPIR-LOW 81 MG EC tablet Take 81 mg by mouth daily. 01/29/17  Yes [provider]  aspirin EC 325 MG tablet Take 1 tablet (325 mg total) by mouth daily. 12/05/16  Yes Saks, Cyril Mourning N, DO  atorvastatin (LIPITOR) 20 MG tablet Take 1 tablet (20 mg total) by mouth daily. 11/07/16  Yes Dorena Dew, FNP  cyclobenzaprine (FLEXERIL) 10 MG tablet Take 1 tablet (10 mg total) by mouth 2 (two) times daily as needed for muscle spasms. 12/07/16  Yes Kirichenko, Tatyana, PA-C  gabapentin (NEURONTIN) 300 MG capsule Take 1 capsule (300 mg total) by mouth 3 (three) times daily. 12/05/16  Yes Dorena Dew, FNP  glucose blood (AGAMATRIX PRESTO TEST) test strip Check blood sugars prior to meals and at bedtime 11/07/16  Yes Dorena Dew, FNP  hydrochlorothiazide (MICROZIDE) 12.5 MG capsule Take 1 capsule (12.5 mg total) by mouth daily. 11/12/16 03/17/17 Yes Weaver, Scott T, PA-C  insulin aspart (NOVOLOG) 100 unit/mL injection Inject 1-10 Units into the skin 3 (three) times daily before meals. PER SLIDING SCALE   Yes [provider]  insulin detemir (LEVEMIR) 100 UNIT/ML injection INJECT 30  UNITS in the morning and 20 UNITS at bedtime 12/05/16  Yes Dorena Dew, FNP  isosorbide mononitrate (IMDUR) 30 MG 24 hr tablet Take 1 tablet (30 mg total) by mouth daily. 11/12/16 03/17/17 Yes Richardson Dopp T, PA-C  meloxicam (MOBIC) 15 MG tablet TAKE 1 TABLET BY MOUTH EVERY DAY 01/29/17  Yes Dorena Dew, FNP  metFORMIN (GLUCOPHAGE) 1000 MG tablet TAKE 1 TABLET BY MOUTH 2 TIMES DAILY WITH A MEAL 01/29/17  Yes Hollis, Lachina M, FNP  NITROSTAT 0.4 MG SL tablet DISSOLVE ONE TABLET UNDER THE TONGUE EVERY 5 MINUTES AS NEEDED FOR CHEST PAIN UP TO 3 DOSES 01/29/17  Yes Dorena Dew, FNP  ondansetron (ZOFRAN ODT) 8 MG disintegrating tablet Take 1 tablet (8 mg total) by mouth every 8 (eight) hours as needed for nausea or vomiting. 11/27/16  Yes Kirichenko, Tatyana, PA-C  pantoprazole (PROTONIX) 20 MG tablet  Take 1 tablet (20 mg total) by mouth daily. 11/29/16  Yes Virgel Manifold, MD  ondansetron (ZOFRAN) 4 MG tablet Take 1 tablet (4 mg total) by mouth every 6 (six) hours. Patient not taking: Reported on 03/17/2017 11/29/16   Virgel Manifold, MD  pantoprazole ([place]) 40 MG tablet TAKE 1 TABLET BY MOUTH EVERY DAY Patient not taking: Reported on 03/17/2017 01/29/17   Dorena Dew, FNP    Family History Family History  Problem Relation Age of Onset  . Diabetes Mother   . Heart failure Mother   . Diabetes Sister   . Diabetes Brother     Social History Social History  Substance Use Topics  . Smoking status: Current Every Day Smoker    Packs/day: 0.25    Years: 25.00    Types: Cigarettes  . Smokeless tobacco: Never Used     Comment: 2 per day   . Alcohol use No     Allergies   Lisinopril   Review of Systems Review of Systems  HENT: Positive for sore throat.   All other systems reviewed and are negative.    Physical Exam Updated Vital Signs BP (!) 131/103   Pulse 75   Temp 98.9 F (37.2 C) (Oral)   Resp 15   SpO2 96%   Physical Exam  Constitutional: He is oriented to person, place, and time.  Uncomfortable   HENT:  Head: Normocephalic.  OP very red. + trismus, difficult visualize the posterior pharynx. Tonsils enlarged bilaterally. + R cervical LAD   Eyes: EOM are normal. Pupils are equal, round, and reactive to light.  Neck:  + right cervical LAD. No stridor.   Cardiovascular: Normal rate, regular rhythm and normal heart sounds.   Pulmonary/Chest: Effort normal and breath sounds normal. No respiratory distress. He has no wheezes. He has no rales.  Abdominal: Soft. Bowel sounds are normal. He exhibits no distension. There is no tenderness. There is no guarding.  Musculoskeletal: Normal range of motion. He exhibits no edema.  Neurological: He is alert and oriented to person, place, and time. No cranial nerve deficit. Coordination normal.  Skin: Skin is warm.    Psychiatric: He has a normal mood and affect.  Nursing note and vitals reviewed.    ED Treatments / Results  Labs (all labs ordered are listed, but only abnormal results are displayed) Labs Reviewed  RAPID STREP SCREEN (NOT AT [facility]) - Abnormal; Notable for the following:       Result Value   Streptococcus, Group A Screen (Direct) POSITIVE (*)    All other components within normal limits  COMPREHENSIVE METABOLIC PANEL - Abnormal; Notable for the following:    Glucose, Bld 295 (*)    Albumin 3.3 (*)    AST 14 (*)    ALT 16 (*)    All other components within  normal limits  CBC WITH DIFFERENTIAL/PLATELET    EKG  EKG Interpretation None       Radiology Ct Soft Tissue Neck W Contrast  Result Date: 03/17/2017 CLINICAL DATA:  Sore throat. Swelling. Difficulty swallowing. Three days duration. EXAM: CT NECK WITH CONTRAST TECHNIQUE: Multidetector CT imaging of the neck was performed using the standard protocol following the bolus administration of intravenous contrast. CONTRAST:  29mL ISOVUE-300 IOPAMIDOL (ISOVUE-300) INJECTION 61% COMPARISON:  None. FINDINGS: Pharynx and larynx: Both tonsils appear inflamed. There are small areas of low-density bilaterally, approximately 6 mm on the right and 13 mm on the left that could represent early peritonsillar abscess formation. These may simply be areas of phlegmonous inflammation at this point. There is no large or dissecting regional abscess. No evidence of retropharyngeal abscess. Retropharyngeal soft tissues appear normal. No laryngeal lesion is seen. There may be some paresis of the vocal fold on the left. Salivary glands: Parotid and submandibular glands are normal. Thyroid: Normal Lymph nodes: No enlarged or low-density nodes on either side of the neck. Vascular: Normal Limited intracranial: Normal Visualized orbits: Normal Mastoids and visualized paranasal sinuses: Normal Skeleton: Ordinary cervical spondylosis. Upper chest: Normal Other: None  IMPRESSION: Pharyngitis and tonsillitis. 6 mm area of low-density in the right tonsil and 13 mm area in the left tonsil consistent with phlegmonous inflammation or early peritonsillar abscess formation. No evidence of advanced abscess or deep extension. No evidence of retropharyngeal abscess. Electronically Signed   By: Nelson Chimes M.D.   On: 03/17/2017 14:15    Procedures Procedures (including critical care time)  Medications Ordered in ED Medications  sodium chloride 0.9 % bolus 1,000 mL (0 mLs Intravenous Stopped 03/17/17 1117)  dexamethasone (DECADRON) injection 10 mg (10 mg Intravenous Given 03/17/17 1016)  clindamycin (CLEOCIN) IVPB 600 mg (0 mg Intravenous Stopped 03/17/17 1117)  morphine 4 MG/ML injection 4 mg (4 mg Intravenous Given 03/17/17 1020)  iopamidol (ISOVUE-300) 61 % injection (75 mLs  Contrast Given 03/17/17 1345)     Initial Impression / Assessment and Plan / ED Course  I have reviewed the triage vital signs and the nursing notes.  Pertinent labs & imaging results that were available during my care of the patient were reviewed by me and considered in my medical decision making (see chart for details).     Riaan Toledo Chesmore is a 56 y.o. male here with sore throat, throat swelling. No obvious lip swelling. Concerned for possible PTA or RPA. Will get labs, rapid strep, CT neck. Will give decadron, clinda and reassess.   2:30 pm Rapid strep positive. WBC nl. Given clinda, decadron. Felt better. Trismus improved now. CT showed 6 mm tonsils R side and 13 mm tonsil on L side. Called Dr. Erik Obey, who reviewed CT and felt that its tonsillitis and patient has no pocket to drain. He recommend continue clinda, follow up with PCP for now and with him if it gets work.    Final Clinical Impressions(s) / ED Diagnoses   Final diagnoses:  None    New Prescriptions New Prescriptions   No medications on file     Drenda Freeze, MD 03/17/17 [phone]

## 2017-03-17 NOTE — Discharge Instructions (Signed)
Take tylenol, motrin for pain or fever.   Take vicodin for severe pain.   Take clindamycin four times daily for a week.   Expect sore throat for several days.   Your sugar may be elevated for several days. Continue other meds.   Soft diet for 3-4 days.   See your primary care doctor.   See ENT if your throat is more swollen.   Return to ER if you have worse sore throat, trouble swallowing, fever, dehydration.

## 2017-03-17 NOTE — ED Notes (Signed)
Got patient ready for discharge 

## 2017-03-17 NOTE — ED Notes (Signed)
Pt stats he understands instructions. Speaking in clear full sentences. Home stable with steady gait.

## 2017-03-17 NOTE — ED Notes (Signed)
Patient transported to CT 

## 2017-03-17 NOTE — ED Notes (Signed)
Patient is resting

## 2017-04-14 ENCOUNTER — Encounter: Payer: Self-pay | Admitting: Vascular Surgery

## 2017-04-29 ENCOUNTER — Ambulatory Visit (INDEPENDENT_AMBULATORY_CARE_PROVIDER_SITE_OTHER): Payer: Medicaid Other | Admitting: Vascular Surgery

## 2017-04-29 ENCOUNTER — Ambulatory Visit (HOSPITAL_COMMUNITY)
Admission: RE | Admit: 2017-04-29 | Discharge: 2017-04-29 | Disposition: A | Discharge status: Home / Self Care | Payer: Medicaid Other | Source: Ambulatory Visit | Attending: Vascular Surgery | Admitting: Vascular Surgery

## 2017-04-29 ENCOUNTER — Encounter: Payer: Self-pay | Admitting: Vascular Surgery

## 2017-04-29 VITALS — BP 122/85 | HR 83 | Temp 97.5°F | Resp 18 | Ht 71.0 in | Wt 170.0 lb

## 2017-04-29 DIAGNOSIS — I714 Abdominal aortic aneurysm, without rupture, unspecified: Secondary | ICD-10-CM

## 2017-04-29 DIAGNOSIS — I723 Aneurysm of iliac artery: Secondary | ICD-10-CM | POA: Diagnosis not present

## 2017-04-29 NOTE — Progress Notes (Signed)
Patient name: Ryan Medina MRN: [id number] DOB: [birth year] Sex: male   REASON FOR CONSULT:    Abdominal aortic aneurysm. The consult is requested by the [facility].  HPI:   Ryan Medina is a pleasant 56 y.o. male,  who had an MRI done because of some lumbar disc disease and an incidental finding was a 3.2 cm abdominal aortic aneurysm and also bilateral common iliac artery aneurysms. An ultrasound was ordered for follow up of this. Patient does have some chronic low back pain related to degenerative disc disease. He denies any abdominal pain.  There is no family history of aneurysmal disease and that he is aware of.  He denies any history of claudication, rest pain, or nonhealing ulcers.  Past Medical History:  Diagnosis Date  . Back pain, chronic   . Diabetes ([place])   . Dilated cardiomyopathy ([place])    Echo 2/18: EF 45-50, diff HK, Gr 1 DD, trivial AI/TR  . HLD (hyperlipidemia)   . Hypertension   . IDDM (insulin dependent diabetes mellitus) ([place])   . Ruptured lumbar disc     Family History  Problem Relation Age of Onset  . Diabetes Mother   . Heart failure Mother   . Diabetes Sister   . Diabetes Brother    There is no family history of aneurysmal disease or family history of premature cardiovascular disease.  SOCIAL HISTORY: He smokes 3-4 cigarettes a day. He has been smoking for 25 years.  Social History   Social History  . Marital status: Single    Spouse name: N/A  . Number of children: N/A  . Years of education: N/A   Occupational History  . Not on file.   Social History Main Topics  . Smoking status: Current Every Day Smoker    Packs/day: 0.25    Years: 25.00    Types: Cigarettes  . Smokeless tobacco: Never Used     Comment: 2 per day   . Alcohol use No  . Drug use: No  . Sexual activity: Not on file   Other Topics Concern  . Not on file   Social History Narrative  . No narrative on file    Allergies  Allergen Reactions  .  Lisinopril Anaphylaxis    Current Outpatient Prescriptions  Medication Sig Dispense Refill  . amLODipine (NORVASC) 10 MG tablet TAKE 1 TABLET BY MOUTH EVERY DAY 30 tablet 2  . ASPIR-LOW 81 MG EC tablet Take 81 mg by mouth daily.  11  . aspirin EC 325 MG tablet Take 1 tablet (325 mg total) by mouth daily. 90 tablet 3  . clindamycin (CLEOCIN) 300 MG capsule Take 1 capsule (300 mg total) by mouth 4 (four) times daily. X 7 days 28 capsule 0  . gabapentin (NEURONTIN) 300 MG capsule Take 1 capsule (300 mg total) by mouth 3 (three) times daily. 90 capsule 5  . glucose blood (AGAMATRIX PRESTO TEST) test strip Check blood sugars prior to meals and at bedtime 100 each 12  . HYDROcodone-acetaminophen (NORCO/VICODIN) 5-325 MG tablet Take 1 tablet by mouth every 6 (six) hours as needed. 6 tablet 0  . insulin aspart (NOVOLOG) 100 unit/mL injection Inject 1-10 Units into the skin 3 (three) times daily before meals. PER SLIDING SCALE    . insulin detemir (LEVEMIR) 100 UNIT/ML injection INJECT 30  UNITS in the morning and 20 UNITS at bedtime 10 mL 5  . meloxicam (MOBIC) 15 MG tablet TAKE 1 TABLET BY MOUTH  EVERY DAY 30 tablet 2  . metFORMIN (GLUCOPHAGE) 1000 MG tablet TAKE 1 TABLET BY MOUTH 2 TIMES DAILY WITH A MEAL 60 tablet 2  . NITROSTAT 0.4 MG SL tablet DISSOLVE ONE TABLET UNDER THE TONGUE EVERY 5 MINUTES AS NEEDED FOR CHEST PAIN UP TO 3 DOSES 25 tablet 2  . atorvastatin (LIPITOR) 20 MG tablet Take 1 tablet (20 mg total) by mouth daily. (Patient not taking: Reported on 04/29/2017) 90 tablet 3  . cyclobenzaprine (FLEXERIL) 10 MG tablet Take 1 tablet (10 mg total) by mouth 2 (two) times daily as needed for muscle spasms. (Patient not taking: Reported on 04/29/2017) 20 tablet 0  . hydrochlorothiazide (MICROZIDE) 12.5 MG capsule Take 1 capsule (12.5 mg total) by mouth daily. 90 capsule 3  . isosorbide mononitrate (IMDUR) 30 MG 24 hr tablet Take 1 tablet (30 mg total) by mouth daily. 90 tablet 3  . ondansetron (ZOFRAN  ODT) 8 MG disintegrating tablet Take 1 tablet (8 mg total) by mouth every 8 (eight) hours as needed for nausea or vomiting. (Patient not taking: Reported on 04/29/2017) 10 tablet 0  . ondansetron (ZOFRAN) 4 MG tablet Take 1 tablet (4 mg total) by mouth every 6 (six) hours. (Patient not taking: Reported on 04/29/2017) 12 tablet 0  . pantoprazole (PROTONIX) 20 MG tablet Take 1 tablet (20 mg total) by mouth daily. (Patient not taking: Reported on 04/29/2017) 30 tablet 0  . pantoprazole (PROTONIX) 40 MG tablet TAKE 1 TABLET BY MOUTH EVERY DAY (Patient not taking: Reported on 04/29/2017) 30 tablet 2   Current Facility-Administered Medications  Medication Dose Route Frequency Provider Last Rate Last Dose  . insulin lispro protamine-lispro (HUMALOG 50/50 MIX) (50-50) 100 UNIT/ML injection 10 Units  10 Units Subcutaneous Once Dorena Dew, FNP        REVIEW OF SYSTEMS:  [X]  denotes positive finding, [ ]  denotes negative finding Cardiac  Comments:  Chest pain or chest pressure: X   Shortness of breath upon exertion: X   Short of breath when lying flat:    Irregular heart rhythm:        Vascular    Pain in calf, thigh, or hip brought on by ambulation: X   Pain in feet at night that wakes you up from your sleep:     Blood clot in your veins:    Leg swelling:  X       Pulmonary    Oxygen at home:    Productive cough:     Wheezing:         Neurologic    Sudden weakness in arms or legs:     Sudden numbness in arms or legs:     Sudden onset of difficulty speaking or slurred speech:    Temporary loss of vision in one eye:     Problems with dizziness:  X       Gastrointestinal    Blood in stool:     Vomited blood:         Genitourinary    Burning when urinating:     Blood in urine:        Psychiatric    Major depression:         Hematologic    Bleeding problems:    Problems with blood clotting too easily:        Skin    Rashes or ulcers:        Constitutional    Fever or chills:  PHYSICAL EXAM:   Vitals:   04/29/17 0849  BP: 122/85  Pulse: 83  Resp: 18  Temp: (!) 97.5 F (36.4 C)  SpO2: 98%  Weight: 170 lb (77.1 kg)  Height: 5\' 11"  (1.803 m)    GENERAL: The patient is a well-nourished male, in no acute distress. The vital signs are documented above. CARDIAC: There is a regular rate and rhythm.  VASCULAR: I do not detect carotid bruits.  He has palpable femoral, popliteal, and pedal pulses bilaterally.  He has no significant lower extremity swelling.  PULMONARY: There is good air exchange bilaterally without wheezing or rales. ABDOMEN: Soft and non-tender with normal pitched bowel sounds. I cannot palpate his abdominal aortic aneurysm.  MUSCULOSKELETAL: There are no major deformities or cyanosis. NEUROLOGIC: No focal weakness or paresthesias are detected. SKIN: There are no ulcers or rashes noted. PSYCHIATRIC: The patient has a normal affect.  DATA:    LABS:  Creatinine on 03/17/2017 was 1.03 with a GFR of greater than 60.  MRI LUMBAR SPINE: I did review the MRI that was done in February of this year. The patient does have significant degenerative disc disease. An incidental finding was a 3.2 cm infrarenal abdominal aortic aneurysm and bilateral common iliac artery aneurysms. A follow up ultrasound was recommended in 3 years.  DUPLEX ABDOMINAL AORTA: I have independently interpreted the duplex of his abdominal aorta today which shows that the maximum diameter of his aorta is 3.3 cm. The right common iliac artery measures 3.2 cm in maximum diameter. The left common iliac artery measures 2.6 cm in maximum diameter.  MEDICAL ISSUES:   ABDOMINAL AORTIC ANEURYSM AND BILATERAL COMMON ILIAC ARTERY ANEURYSMS:  This patient has a 3.2 cm infrarenal abdominal aortic aneurysm. However he also has a 3.2 cm right common iliac artery aneurysm and a 2.6 cm left common iliac artery aneurysm.  I've explained that in a normal risk patient we would consider elective  repair of an abdominal aortic aneurysm of 5.5 cm. I have explained that tobacco use and poorly controlled blood pressure of the 2 main risk factors for continued aneurysm expansion. We have had a long discussion about the importance of tobacco cessation. He has a 3.2 cm right common iliac artery aneurysm. I ordered a follow up ultrasound in 1 year. If the right common iliac artery continues to enlarge we could potentially have to consider elective repair for this reason despite the fact that his abdominal aortic aneurysm was not especially large. Given the size of the right common iliac artery aneurysm he would likely require open repair or there is a small chance that he would be a candidate for a bilateral iliac branch device.  I will see him back in 1 year. He knows to call sooner if he has problems.  [name] [facility] [phone]

## 2017-05-08 NOTE — Addendum Note (Signed)
Addended by: Lianne Cure A on: 05/08/2017 04:12 PM   Modules accepted: Orders

## 2017-05-08 NOTE — Addendum Note (Signed)
Addended by: Lianne Cure A on: 05/08/2017 01:31 PM   Modules accepted: Orders

## 2017-10-01 ENCOUNTER — Emergency Department (HOSPITAL_COMMUNITY)
Admission: EM | Admit: 2017-10-01 | Discharge: 2017-10-01 | Disposition: A | Discharge status: Home / Self Care | Payer: Medicaid Other | Attending: Emergency Medicine | Admitting: Emergency Medicine

## 2017-10-01 ENCOUNTER — Encounter (HOSPITAL_COMMUNITY): Payer: Self-pay

## 2017-10-01 DIAGNOSIS — I10 Essential (primary) hypertension: Secondary | ICD-10-CM | POA: Insufficient documentation

## 2017-10-01 DIAGNOSIS — Z7982 Long term (current) use of aspirin: Secondary | ICD-10-CM | POA: Insufficient documentation

## 2017-10-01 DIAGNOSIS — R739 Hyperglycemia, unspecified: Secondary | ICD-10-CM

## 2017-10-01 DIAGNOSIS — E1165 Type 2 diabetes mellitus with hyperglycemia: Secondary | ICD-10-CM | POA: Diagnosis not present

## 2017-10-01 DIAGNOSIS — Z794 Long term (current) use of insulin: Secondary | ICD-10-CM | POA: Diagnosis not present

## 2017-10-01 DIAGNOSIS — F17210 Nicotine dependence, cigarettes, uncomplicated: Secondary | ICD-10-CM | POA: Insufficient documentation

## 2017-10-01 DIAGNOSIS — Z79899 Other long term (current) drug therapy: Secondary | ICD-10-CM | POA: Insufficient documentation

## 2017-10-01 LAB — URINALYSIS, ROUTINE W REFLEX MICROSCOPIC
BILIRUBIN URINE: NEGATIVE
Bacteria, UA: NONE SEEN
Glucose, UA: >=500 mg/dL — AB
Hgb urine dipstick: NEGATIVE
KETONES UR: 20 mg/dL — AB
Leukocytes, UA: NEGATIVE
Nitrite: NEGATIVE
PH: 5 (ref 5.0–8.0)
Protein, ur: NEGATIVE mg/dL
Specific Gravity, Urine: 1.032 — ABNORMAL HIGH (ref 1.005–1.030)

## 2017-10-01 LAB — CBC
HCT: 37.8 % — ABNORMAL LOW (ref 39.0–52.0)
Hemoglobin: 12.6 g/dL — ABNORMAL LOW (ref 13.0–17.0)
MCH: 28.9 pg (ref 26.0–34.0)
MCHC: 33.3 g/dL (ref 30.0–36.0)
MCV: 86.7 fL (ref 78.0–100.0)
PLATELETS: 261 10*3/uL (ref 150–400)
RBC: 4.36 MIL/uL (ref 4.22–5.81)
RDW: 12.1 % (ref 11.5–15.5)
WBC: 5.4 10*3/uL (ref 4.0–10.5)

## 2017-10-01 LAB — CBG MONITORING, ED
Glucose-Capillary: 404 mg/dL — ABNORMAL HIGH (ref 65–99)
Glucose-Capillary: 258 mg/dL — ABNORMAL HIGH (ref 65–99)

## 2017-10-01 LAB — BASIC METABOLIC PANEL
Anion gap: 9 (ref 5–15)
BUN: 12 mg/dL (ref 6–20)
CALCIUM: 8.9 mg/dL (ref 8.9–10.3)
CHLORIDE: 101 mmol/L (ref 101–111)
CO2: 25 mmol/L (ref 22–32)
CREATININE: 1.07 mg/dL (ref 0.61–1.24)
GFR calc Af Amer: >60 mL/min (ref >60)
GFR calc non Af Amer: >60 mL/min (ref >60)
Glucose, Bld: 420 mg/dL — ABNORMAL HIGH (ref 65–99)
Potassium: 4 mmol/L (ref 3.5–5.1)
Sodium: 135 mmol/L (ref 135–145)

## 2017-10-01 NOTE — ED Triage Notes (Signed)
Per Pt, Pt is coming from home with complaints of increased urination and hyperglycemia. Reports having trouble with BS for one month, and he is not able to keep it down any more.

## 2017-10-01 NOTE — Discharge Instructions (Signed)
Please read attached information. If you experience any new or worsening signs or symptoms please return to the emergency room for evaluation. Please follow-up with your primary care provider or specialist as discussed. Please use medication prescribed only as directed and discontinue taking if you have any concerning signs or symptoms.   °

## 2017-10-01 NOTE — ED Provider Notes (Signed)
[facility] EMERGENCY DEPARTMENT Provider Note   CSN: [id number] Arrival date & time: 10/01/17  1047     History   Chief Complaint Chief Complaint  Patient presents with  . Hyperglycemia    HPI Ryan Medina is a 57 y.o. male.  HPI   57 year old male presents today with complaints of hyperglycemia.  Patient reports that he has been having difficulty balancing his blood sugar levels.  Patient notes he has been eating a poor diet, and using his Lantus once daily.  Patient notes that today he checked his blood sugar was feeling slightly ill and noticed it read "high" on his monitor.  Patient denies any nausea vomiting or significant abdominal pain.  He denies any fever or chills.  Past Medical History:  Diagnosis Date  . Back pain, chronic   . Diabetes ([place])   . Dilated cardiomyopathy ([place])    Echo 2/18: EF 45-50, diff HK, Gr 1 DD, trivial AI/TR  . HLD (hyperlipidemia)   . Hypertension   . IDDM (insulin dependent diabetes mellitus) ([place])   . Ruptured lumbar disc     Patient Active Problem List   Diagnosis Date Noted  . Dilated cardiomyopathy ([place])   . Other hyperlipidemia 11/11/2016  . Abnormal nuclear stress test 05/19/2016  . Atypical angina ([place]) 04/25/2016  . DM (diabetes mellitus), type 2, uncontrolled ([place]) 03/14/2015  . Essential hypertension 03/14/2015  . Hyperglycemia 01/14/2015    Past Surgical History:  Procedure Laterality Date  . ABDOMINAL SURGERY    . APPENDECTOMY    . BACK SURGERY    . CARDIAC CATHETERIZATION N/A 05/19/2016   Procedure: Left Heart Cath and Coronary Angiography;  Surgeon: Leonie Man, MD;  Location: [facility];  Service: Cardiovascular;  Laterality: N/A;       Home Medications    Prior to Admission medications   Medication Sig Start Date End Date Taking? Authorizing Provider  albuterol (PROVENTIL HFA;VENTOLIN HFA) 108 (90 Base) MCG/ACT inhaler Inhale 1-2 puffs into the lungs every 6 (six) hours as  needed for wheezing or shortness of breath.   Yes [provider]  amLODipine (NORVASC) 10 MG tablet TAKE 1 TABLET BY MOUTH EVERY DAY 01/29/17  Yes Dorena Dew, FNP  ASPIR-LOW 81 MG EC tablet Take 81 mg by mouth daily. 01/29/17  Yes [provider]  aspirin EC 325 MG tablet Take 1 tablet (325 mg total) by mouth daily. 12/05/16  Yes Spera, Delice Bison, DO  gabapentin (NEURONTIN) 300 MG capsule Take 1 capsule (300 mg total) by mouth 3 (three) times daily. 12/05/16  Yes Dorena Dew, FNP  hydrochlorothiazide (MICROZIDE) 12.5 MG capsule Take 1 capsule (12.5 mg total) by mouth daily. 11/12/16 10/01/17 Yes Weaver, Scott T, PA-C  insulin aspart (NOVOLOG) 100 unit/mL injection Inject 1-10 Units into the skin 3 (three) times daily before meals. PER SLIDING SCALE   Yes [provider]  insulin detemir (LEVEMIR) 100 UNIT/ML injection INJECT 30  UNITS in the morning and 20 UNITS at bedtime 12/05/16  Yes Dorena Dew, FNP  isosorbide mononitrate (IMDUR) 30 MG 24 hr tablet Take 1 tablet (30 mg total) by mouth daily. 11/12/16 10/01/17 Yes Weaver, Scott T, PA-C  meloxicam (MOBIC) 15 MG tablet TAKE 1 TABLET BY MOUTH EVERY DAY 01/29/17  Yes Dorena Dew, FNP  metFORMIN (GLUCOPHAGE) 1000 MG tablet TAKE 1 TABLET BY MOUTH 2 TIMES DAILY WITH A MEAL 01/29/17  Yes Dorena Dew, FNP  NITROSTAT 0.4  MG SL tablet DISSOLVE ONE TABLET UNDER THE TONGUE EVERY 5 MINUTES AS NEEDED FOR CHEST PAIN UP TO 3 DOSES 01/29/17  Yes Dorena Dew, FNP  atorvastatin (LIPITOR) 20 MG tablet Take 1 tablet (20 mg total) by mouth daily. Patient not taking: Reported on 04/29/2017 11/07/16   Dorena Dew, FNP  cyclobenzaprine (FLEXERIL) 10 MG tablet Take 1 tablet (10 mg total) by mouth 2 (two) times daily as needed for muscle spasms. Patient not taking: Reported on 04/29/2017 12/07/16   Jeannett Senior, PA-C  glucose blood (AGAMATRIX PRESTO TEST) test strip Check blood sugars prior to meals and at bedtime  11/07/16   Dorena Dew, FNP  HYDROcodone-acetaminophen (NORCO/VICODIN) 5-325 MG tablet Take 1 tablet by mouth every 6 (six) hours as needed. Patient not taking: Reported on 10/01/2017 03/17/17   Drenda Freeze, MD  ondansetron (ZOFRAN ODT) 8 MG disintegrating tablet Take 1 tablet (8 mg total) by mouth every 8 (eight) hours as needed for nausea or vomiting. Patient not taking: Reported on 04/29/2017 11/27/16   Jeannett Senior, PA-C  ondansetron (ZOFRAN) 4 MG tablet Take 1 tablet (4 mg total) by mouth every 6 (six) hours. Patient not taking: Reported on 04/29/2017 11/29/16   Virgel Manifold, MD  pantoprazole (PROTONIX) 20 MG tablet Take 1 tablet (20 mg total) by mouth daily. Patient not taking: Reported on 04/29/2017 11/29/16   Virgel Manifold, MD  pantoprazole ([place]) 40 MG tablet TAKE 1 TABLET BY MOUTH EVERY DAY Patient not taking: Reported on 04/29/2017 01/29/17   Dorena Dew, FNP    Family History Family History  Problem Relation Age of Onset  . Diabetes Mother   . Heart failure Mother   . Diabetes Sister   . Diabetes Brother     Social History Social History   Tobacco Use  . Smoking status: Current Every Day Smoker    Packs/day: 0.25    Years: 25.00    Pack years: 6.25    Types: Cigarettes  . Smokeless tobacco: Never Used  . Tobacco comment: 2 per day   Substance Use Topics  . Alcohol use: No  . Drug use: No     Allergies   Lisinopril   Review of Systems Review of Systems  All other systems reviewed and are negative.   Physical Exam Updated Vital Signs BP (!) 128/93   Pulse 72   Temp 97.8 F (36.6 C) (Oral)   Resp 18   Ht 5\' 11"  (1.803 m)   Wt 77.1 kg (170 lb)   SpO2 97%   BMI 23.71 kg/m   Physical Exam  Constitutional: He is oriented to person, place, and time. He appears well-developed and well-nourished.  HENT:  Head: Normocephalic and atraumatic.  Eyes: Conjunctivae are normal. Pupils are equal, round, and reactive to light. Right eye  exhibits no discharge. Left eye exhibits no discharge. No scleral icterus.  Neck: Normal range of motion. No JVD present. No tracheal deviation present.  Pulmonary/Chest: Effort normal. No stridor.  Neurological: He is alert and oriented to person, place, and time. Coordination normal.  Psychiatric: He has a normal mood and affect. His behavior is normal. Judgment and thought content normal.  Nursing note and vitals reviewed.    ED Treatments / Results  Labs (all labs ordered are listed, but only abnormal results are displayed) Labs Reviewed  BASIC METABOLIC PANEL - Abnormal; Notable for the following components:      Result Value   Glucose, Bld 420 (*)  All other components within normal limits  CBC - Abnormal; Notable for the following components:   Hemoglobin 12.6 (*)    HCT 37.8 (*)    All other components within normal limits  URINALYSIS, ROUTINE W REFLEX MICROSCOPIC - Abnormal; Notable for the following components:   Specific Gravity, Urine 1.032 (*)    Glucose, UA >=500 (*)    Ketones, ur 20 (*)    Squamous Epithelial / LPF 0-5 (*)    All other components within normal limits  CBG MONITORING, ED - Abnormal; Notable for the following components:   Glucose-Capillary 404 (*)    All other components within normal limits  CBG MONITORING, ED - Abnormal; Notable for the following components:   Glucose-Capillary 258 (*)    All other components within normal limits    EKG  EKG Interpretation None       Radiology No results found.  Procedures Procedures (including critical care time)  Medications Ordered in ED Medications - No data to display   Initial Impression / Assessment and Plan / ED Course  I have reviewed the triage vital signs and the nursing notes.  Pertinent labs & imaging results that were available during my care of the patient were reviewed by me and considered in my medical decision making (see chart for details).     Final Clinical  Impressions(s) / ED Diagnoses   Final diagnoses:  Hyperglycemia   Labs: Urinalysis, BMP, CBC, CBG Imaging:  Consults:  Therapeutics:  Discharge Meds:   Assessment/Plan: 57 year old male presents today with hyperglycemia.  Patient without any intervention had reduction in his blood sugar to 258, no signs of DKA or hyperosmolar state.  Patient well-appearing in no acute distress.  After discussion with patient he is not using his medications accurately.  He is using Lantus once daily, and not using his NovoLog as needed.  I discussed his use of insulin including Lantus twice daily and NovoLog on a sliding scale.  Patient will monitor closely, keep a log of blood sugar and meals along with amount of insulin use.  He will follow-up with his primary care and return immediately if any new or worsening signs or symptoms present.  Patient verbalized understanding and agreement to today's plan had    ED Discharge Orders    None       Francee Gentile 10/01/17 2126    Jola Schmidt, MD 10/01/17 2321

## 2017-11-12 ENCOUNTER — Emergency Department (HOSPITAL_COMMUNITY): Payer: Medicaid Other

## 2017-11-12 ENCOUNTER — Emergency Department (HOSPITAL_COMMUNITY)
Admission: EM | Admit: 2017-11-12 | Discharge: 2017-11-12 | Disposition: A | Discharge status: Home / Self Care | Payer: Medicaid Other | Attending: Emergency Medicine | Admitting: Emergency Medicine

## 2017-11-12 ENCOUNTER — Encounter (HOSPITAL_COMMUNITY): Payer: Self-pay | Admitting: *Deleted

## 2017-11-12 ENCOUNTER — Other Ambulatory Visit: Payer: Self-pay

## 2017-11-12 DIAGNOSIS — Z79899 Other long term (current) drug therapy: Secondary | ICD-10-CM | POA: Insufficient documentation

## 2017-11-12 DIAGNOSIS — E1165 Type 2 diabetes mellitus with hyperglycemia: Secondary | ICD-10-CM | POA: Diagnosis not present

## 2017-11-12 DIAGNOSIS — R1013 Epigastric pain: Secondary | ICD-10-CM | POA: Insufficient documentation

## 2017-11-12 DIAGNOSIS — Z794 Long term (current) use of insulin: Secondary | ICD-10-CM | POA: Insufficient documentation

## 2017-11-12 DIAGNOSIS — I714 Abdominal aortic aneurysm, without rupture, unspecified: Secondary | ICD-10-CM

## 2017-11-12 DIAGNOSIS — N200 Calculus of kidney: Secondary | ICD-10-CM | POA: Diagnosis not present

## 2017-11-12 DIAGNOSIS — Z7982 Long term (current) use of aspirin: Secondary | ICD-10-CM | POA: Insufficient documentation

## 2017-11-12 DIAGNOSIS — R109 Unspecified abdominal pain: Secondary | ICD-10-CM | POA: Diagnosis not present

## 2017-11-12 DIAGNOSIS — R079 Chest pain, unspecified: Secondary | ICD-10-CM | POA: Diagnosis present

## 2017-11-12 DIAGNOSIS — I10 Essential (primary) hypertension: Secondary | ICD-10-CM | POA: Insufficient documentation

## 2017-11-12 DIAGNOSIS — F17210 Nicotine dependence, cigarettes, uncomplicated: Secondary | ICD-10-CM | POA: Diagnosis not present

## 2017-11-12 LAB — CBC
HEMATOCRIT: 39.9 % (ref 39.0–52.0)
Hemoglobin: 13.4 g/dL (ref 13.0–17.0)
MCH: 29.3 pg (ref 26.0–34.0)
MCHC: 33.6 g/dL (ref 30.0–36.0)
MCV: 87.3 fL (ref 78.0–100.0)
Platelets: 306 10*3/uL (ref 150–400)
RBC: 4.57 MIL/uL (ref 4.22–5.81)
RDW: 12.3 % (ref 11.5–15.5)
WBC: 5.2 10*3/uL (ref 4.0–10.5)

## 2017-11-12 LAB — URINALYSIS, ROUTINE W REFLEX MICROSCOPIC
BACTERIA UA: NONE SEEN
Bilirubin Urine: NEGATIVE
Glucose, UA: >=500 mg/dL — AB
Hgb urine dipstick: NEGATIVE
KETONES UR: NEGATIVE mg/dL
LEUKOCYTES UA: NEGATIVE
Nitrite: NEGATIVE
PROTEIN: NEGATIVE mg/dL
Specific Gravity, Urine: 1.033 — ABNORMAL HIGH (ref 1.005–1.030)
pH: 6 (ref 5.0–8.0)

## 2017-11-12 LAB — I-STAT TROPONIN, ED: Troponin i, poc: 0 ng/mL (ref 0.00–0.08)

## 2017-11-12 LAB — COMPREHENSIVE METABOLIC PANEL
ALT: 20 U/L (ref 17–63)
AST: 16 U/L (ref 15–41)
Albumin: 3.5 g/dL (ref 3.5–5.0)
Alkaline Phosphatase: 88 U/L (ref 38–126)
Anion gap: 12 (ref 5–15)
BUN: 12 mg/dL (ref 6–20)
CHLORIDE: 98 mmol/L — AB (ref 101–111)
CO2: 26 mmol/L (ref 22–32)
Calcium: 9.5 mg/dL (ref 8.9–10.3)
Creatinine, Ser: 1.05 mg/dL (ref 0.61–1.24)
GFR calc Af Amer: >60 mL/min (ref >60)
Glucose, Bld: 430 mg/dL — ABNORMAL HIGH (ref 65–99)
POTASSIUM: 4.4 mmol/L (ref 3.5–5.1)
SODIUM: 136 mmol/L (ref 135–145)
Total Bilirubin: 0.5 mg/dL (ref 0.3–1.2)
Total Protein: 7 g/dL (ref 6.5–8.1)

## 2017-11-12 LAB — CBG MONITORING, ED
GLUCOSE-CAPILLARY: 386 mg/dL — AB (ref 65–99)
Glucose-Capillary: 347 mg/dL — ABNORMAL HIGH (ref 65–99)

## 2017-11-12 LAB — LIPASE, BLOOD: LIPASE: 86 U/L — AB (ref 11–51)

## 2017-11-12 NOTE — ED Triage Notes (Signed)
Pt has been having left chest and left abdominal pain for the last couple of weeks.  Pt reports vomiting and reports sob.

## 2017-11-12 NOTE — Discharge Instructions (Signed)
Call your doctor for follow Return if worsening pain, vomiting, or fever.

## 2017-11-12 NOTE — ED Provider Notes (Addendum)
[facility] EMERGENCY DEPARTMENT Provider Note   CSN: [id number] Arrival date & time: 11/12/17  5397     History   Chief Complaint Chief Complaint  Patient presents with  . Chest Pain  . Abdominal Pain    HPI Ryan Medina is a 57 y.o. male.  HPI  57 year old man history of insulin-dependent diabetes, cardiomyopathy, back pain presents today complaining of 2 weeks of left sided upper abdominal pain.  It has been present constantly and is moderate and described as sharp and dull.  He has not taken any medication.  He has no associated symptoms of nausea, vomiting, diarrhea, worsening with food, increased frequency of urination, hematuria, or pain with urination.  Past Medical History:  Diagnosis Date  . Back pain, chronic   . Diabetes ([place])   . Dilated cardiomyopathy ([place])    Echo 2/18: EF 45-50, diff HK, Gr 1 DD, trivial AI/TR  . HLD (hyperlipidemia)   . Hypertension   . IDDM (insulin dependent diabetes mellitus) ([place])   . Ruptured lumbar disc     Patient Active Problem List   Diagnosis Date Noted  . Dilated cardiomyopathy ([place])   . Other hyperlipidemia 11/11/2016  . Abnormal nuclear stress test 05/19/2016  . Atypical angina ([place]) 04/25/2016  . DM (diabetes mellitus), type 2, uncontrolled ([place]) 03/14/2015  . Essential hypertension 03/14/2015  . Hyperglycemia 01/14/2015    Past Surgical History:  Procedure Laterality Date  . ABDOMINAL SURGERY    . APPENDECTOMY    . BACK SURGERY    . CARDIAC CATHETERIZATION N/A 05/19/2016   Procedure: Left Heart Cath and Coronary Angiography;  Surgeon: Leonie Man, MD;  Location: [facility];  Service: Cardiovascular;  Laterality: N/A;       Home Medications    Prior to Admission medications   Medication Sig Start Date End Date Taking? Authorizing Provider  albuterol (PROVENTIL HFA;VENTOLIN HFA) 108 (90 Base) MCG/ACT inhaler Inhale 1-2 puffs into the lungs every 6 (six) hours as needed for  wheezing or shortness of breath.    [provider]  amLODipine (NORVASC) 10 MG tablet TAKE 1 TABLET BY MOUTH EVERY DAY 01/29/17   Dorena Dew, FNP  ASPIR-LOW 81 MG EC tablet Take 81 mg by mouth daily. 01/29/17   [provider]  aspirin EC 325 MG tablet Take 1 tablet (325 mg total) by mouth daily. 12/05/16   Trice, Delice Bison, DO  atorvastatin (LIPITOR) 20 MG tablet Take 1 tablet (20 mg total) by mouth daily. Patient not taking: Reported on 04/29/2017 11/07/16   Dorena Dew, FNP  cyclobenzaprine (FLEXERIL) 10 MG tablet Take 1 tablet (10 mg total) by mouth 2 (two) times daily as needed for muscle spasms. Patient not taking: Reported on 04/29/2017 12/07/16   Jeannett Senior, PA-C  gabapentin (NEURONTIN) 300 MG capsule Take 1 capsule (300 mg total) by mouth 3 (three) times daily. 12/05/16   Dorena Dew, FNP  glucose blood (AGAMATRIX PRESTO TEST) test strip Check blood sugars prior to meals and at bedtime 11/07/16   Dorena Dew, FNP  hydrochlorothiazide (MICROZIDE) 12.5 MG capsule Take 1 capsule (12.5 mg total) by mouth daily. 11/12/16 10/01/17  Richardson Dopp T, PA-C  HYDROcodone-acetaminophen (NORCO/VICODIN) 5-325 MG tablet Take 1 tablet by mouth every 6 (six) hours as needed. Patient not taking: Reported on 10/01/2017 03/17/17   Drenda Freeze, MD  insulin aspart (NOVOLOG) 100 unit/mL injection Inject 1-10 Units into the skin 3 (three) times daily  before meals. PER SLIDING SCALE    [provider]  insulin detemir (LEVEMIR) 100 UNIT/ML injection INJECT 30  UNITS in the morning and 20 UNITS at bedtime 12/05/16   Dorena Dew, FNP  isosorbide mononitrate (IMDUR) 30 MG 24 hr tablet Take 1 tablet (30 mg total) by mouth daily. 11/12/16 10/01/17  Richardson Dopp T, PA-C  meloxicam (MOBIC) 15 MG tablet TAKE 1 TABLET BY MOUTH EVERY DAY 01/29/17   Dorena Dew, FNP  metFORMIN (GLUCOPHAGE) 1000 MG tablet TAKE 1 TABLET BY MOUTH 2 TIMES DAILY WITH A MEAL 01/29/17    Hollis, Asencion Partridge, FNP  NITROSTAT 0.4 MG SL tablet DISSOLVE ONE TABLET UNDER THE TONGUE EVERY 5 MINUTES AS NEEDED FOR CHEST PAIN UP TO 3 DOSES 01/29/17   Dorena Dew, FNP  ondansetron (ZOFRAN ODT) 8 MG disintegrating tablet Take 1 tablet (8 mg total) by mouth every 8 (eight) hours as needed for nausea or vomiting. Patient not taking: Reported on 04/29/2017 11/27/16   Jeannett Senior, PA-C  ondansetron (ZOFRAN) 4 MG tablet Take 1 tablet (4 mg total) by mouth every 6 (six) hours. Patient not taking: Reported on 04/29/2017 11/29/16   Virgel Manifold, MD  pantoprazole (PROTONIX) 20 MG tablet Take 1 tablet (20 mg total) by mouth daily. Patient not taking: Reported on 04/29/2017 11/29/16   Virgel Manifold, MD  pantoprazole ([place]) 40 MG tablet TAKE 1 TABLET BY MOUTH EVERY DAY Patient not taking: Reported on 04/29/2017 01/29/17   Dorena Dew, FNP    Family History Family History  Problem Relation Age of Onset  . Diabetes Mother   . Heart failure Mother   . Diabetes Sister   . Diabetes Brother     Social History Social History   Tobacco Use  . Smoking status: Current Every Day Smoker    Packs/day: 0.25    Years: 25.00    Pack years: 6.25    Types: Cigarettes  . Smokeless tobacco: Never Used  . Tobacco comment: 2 per day   Substance Use Topics  . Alcohol use: No  . Drug use: No     Allergies   Lisinopril   Review of Systems Review of Systems  All other systems reviewed and are negative.    Physical Exam Updated Vital Signs BP 128/89 (BP Location: Right Arm)   Pulse 97   Temp 98.2 F (36.8 C) (Oral)   Resp 18   SpO2 99%   Physical Exam  Constitutional: He appears well-developed and well-nourished.  HENT:  Head: Normocephalic and atraumatic.  Eyes: EOM are normal. Pupils are equal, round, and reactive to light.  Neck: Normal range of motion. Neck supple.  Cardiovascular: Regular rhythm, intact distal pulses and normal pulses.  Pulmonary/Chest: Effort normal and  breath sounds normal.  Abdominal: Soft. Bowel sounds are normal. He exhibits no mass. There is no tenderness. There is no guarding.  Musculoskeletal: Normal range of motion.       Right lower leg: Normal.       Left lower leg: Normal.  Neurological: He is alert.  Skin: Skin is warm and dry. Capillary refill takes less than 2 seconds.  Psychiatric: He has a normal mood and affect.  Nursing note and vitals reviewed.    ED Treatments / Results  Labs (all labs ordered are listed, but only abnormal results are displayed) Labs Reviewed  CBG MONITORING, ED - Abnormal; Notable for the following components:      Result Value   Glucose-Capillary 386 (*)  All other components within normal limits  CBC  LIPASE, BLOOD  COMPREHENSIVE METABOLIC PANEL  URINALYSIS, ROUTINE W REFLEX MICROSCOPIC  I-STAT TROPONIN, ED    EKG  EKG Interpretation  Date/Time:  Thursday November 12 2017 07:15:01 EST Ventricular Rate:  97 PR Interval:  148 QRS Duration: 90 QT Interval:  336 QTC Calculation: 426 R Axis:   68 Text Interpretation:  Normal sinus rhythm Right atrial enlargement T wave abnormality, consider inferior ischemia Abnormal ECG Since last EKG, STE in lead V3 is new No additional new reciprocal changes Persistent TWI in inferolateral leads Confirmed by Duffy Bruce [id number]) on 11/12/2017 7:27:02 AM       Radiology Dg Chest 2 View  Result Date: 11/12/2017 CLINICAL DATA:  Cough and shortness of breath over the last 2 weeks. EXAM: CHEST  2 VIEW COMPARISON:  12/16/2016 FINDINGS: Heart size is normal. Mediastinal shadows are normal. The lungs are clear. No bronchial thickening. No infiltrate, mass, effusion or collapse. Pulmonary vascularity is normal. No bony abnormality. IMPRESSION: Normal chest Electronically Signed   By: Nelson Chimes M.D.   On: 11/12/2017 07:40    Procedures Procedures (including critical care time)  Medications Ordered in ED Medications - No data to  display   Initial Impression / Assessment and Plan / ED Course  I have reviewed the triage vital signs and the nursing notes.  Pertinent labs & imaging results that were available during my care of the patient were reviewed by me and considered in my medical decision making (see chart for details).     57 year old man presents today with upper left-sided abdominal pain.  He is evaluated here for chest and abdominal abnormalities.  He has an insulin dependent diabetic and is hyperglycemic.  He does not appear to have associated symptoms such as fever, vomiting, diarrhea.  Evaluation laboratory consisted mainly with hyperglycemia no evidence of DKA.  He is stable here without evidence of decompensation.  Mild aaa noted, do not think expanding significantly. or rupturing.  Pain reproducible and not evidnce of cardiac etiology.  No other evidence of acute intraabdominal/surgical pathology.  Right sided kidney stone- no evidence of ureteral colic or obstruction.  Final Clinical Impressions(s) / ED Diagnoses   Final diagnoses:  Epigastric pain  Kidney stone  Abdominal aortic aneurysm (AAA) without rupture [facility])    ED Discharge Orders    None       Pattricia Boss, MD 11/12/17 1258    Pattricia Boss, MD 11/12/17 [phone]

## 2017-11-16 ENCOUNTER — Encounter (HOSPITAL_COMMUNITY): Payer: Self-pay | Admitting: *Deleted

## 2017-11-16 ENCOUNTER — Emergency Department (HOSPITAL_COMMUNITY): Payer: Medicaid Other

## 2017-11-16 ENCOUNTER — Emergency Department (HOSPITAL_COMMUNITY)
Admission: EM | Admit: 2017-11-16 | Discharge: 2017-11-16 | Disposition: A | Discharge status: Home / Self Care | Payer: Medicaid Other | Attending: Emergency Medicine | Admitting: Emergency Medicine

## 2017-11-16 ENCOUNTER — Other Ambulatory Visit: Payer: Self-pay

## 2017-11-16 DIAGNOSIS — E119 Type 2 diabetes mellitus without complications: Secondary | ICD-10-CM | POA: Insufficient documentation

## 2017-11-16 DIAGNOSIS — Z794 Long term (current) use of insulin: Secondary | ICD-10-CM | POA: Insufficient documentation

## 2017-11-16 DIAGNOSIS — I10 Essential (primary) hypertension: Secondary | ICD-10-CM | POA: Insufficient documentation

## 2017-11-16 DIAGNOSIS — R112 Nausea with vomiting, unspecified: Secondary | ICD-10-CM | POA: Insufficient documentation

## 2017-11-16 DIAGNOSIS — Z79899 Other long term (current) drug therapy: Secondary | ICD-10-CM | POA: Diagnosis not present

## 2017-11-16 DIAGNOSIS — R1013 Epigastric pain: Secondary | ICD-10-CM | POA: Diagnosis not present

## 2017-11-16 DIAGNOSIS — F17210 Nicotine dependence, cigarettes, uncomplicated: Secondary | ICD-10-CM | POA: Insufficient documentation

## 2017-11-16 DIAGNOSIS — R109 Unspecified abdominal pain: Secondary | ICD-10-CM

## 2017-11-16 DIAGNOSIS — R1084 Generalized abdominal pain: Secondary | ICD-10-CM | POA: Diagnosis not present

## 2017-11-16 DIAGNOSIS — Z7982 Long term (current) use of aspirin: Secondary | ICD-10-CM | POA: Diagnosis not present

## 2017-11-16 LAB — CBC
HCT: 39.2 % (ref 39.0–52.0)
HEMOGLOBIN: 13.1 g/dL (ref 13.0–17.0)
MCH: 29.2 pg (ref 26.0–34.0)
MCHC: 33.4 g/dL (ref 30.0–36.0)
MCV: 87.3 fL (ref 78.0–100.0)
Platelets: 297 10*3/uL (ref 150–400)
RBC: 4.49 MIL/uL (ref 4.22–5.81)
RDW: 12.1 % (ref 11.5–15.5)
WBC: 6.3 10*3/uL (ref 4.0–10.5)

## 2017-11-16 LAB — CBG MONITORING, ED
Glucose-Capillary: 312 mg/dL — ABNORMAL HIGH (ref 65–99)
Glucose-Capillary: 111 mg/dL — ABNORMAL HIGH (ref 65–99)

## 2017-11-16 LAB — URINALYSIS, ROUTINE W REFLEX MICROSCOPIC
Bilirubin Urine: NEGATIVE
Glucose, UA: >=500 mg/dL — AB
Ketones, ur: 5 mg/dL — AB
Nitrite: NEGATIVE
PROTEIN: 30 mg/dL — AB
Specific Gravity, Urine: 1.038 — ABNORMAL HIGH (ref 1.005–1.030)
pH: 5 (ref 5.0–8.0)

## 2017-11-16 LAB — COMPREHENSIVE METABOLIC PANEL
ALT: 16 U/L — ABNORMAL LOW (ref 17–63)
ANION GAP: 10 (ref 5–15)
AST: 14 U/L — ABNORMAL LOW (ref 15–41)
Albumin: 3.3 g/dL — ABNORMAL LOW (ref 3.5–5.0)
Alkaline Phosphatase: 81 U/L (ref 38–126)
BUN: 16 mg/dL (ref 6–20)
CHLORIDE: 97 mmol/L — AB (ref 101–111)
CO2: 28 mmol/L (ref 22–32)
Calcium: 9.1 mg/dL (ref 8.9–10.3)
Creatinine, Ser: 1.09 mg/dL (ref 0.61–1.24)
GFR calc non Af Amer: >60 mL/min (ref >60)
Glucose, Bld: 454 mg/dL — ABNORMAL HIGH (ref 65–99)
Potassium: 3.7 mmol/L (ref 3.5–5.1)
Sodium: 135 mmol/L (ref 135–145)
Total Bilirubin: 0.4 mg/dL (ref 0.3–1.2)
Total Protein: 6.7 g/dL (ref 6.5–8.1)

## 2017-11-16 LAB — I-STAT TROPONIN, ED: TROPONIN I, POC: 0.01 ng/mL (ref 0.00–0.08)

## 2017-11-16 LAB — I-STAT CG4 LACTIC ACID, ED
LACTIC ACID, VENOUS: 0.98 mmol/L (ref 0.5–1.9)
Lactic Acid, Venous: 0.68 mmol/L (ref 0.5–1.9)

## 2017-11-16 LAB — LIPASE, BLOOD: LIPASE: 66 U/L — AB (ref 11–51)

## 2017-11-16 MED ORDER — IOPAMIDOL (ISOVUE-370) INJECTION 76%
INTRAVENOUS | Status: AC
  Administered 2017-11-16: 100 mL
  Filled 2017-11-16: qty 100

## 2017-11-16 MED ORDER — DICYCLOMINE HCL 20 MG PO TABS: 20.0000 mg | ORAL_TABLET | Freq: Two times a day (BID) | ORAL | 0 refills | Status: DC

## 2017-11-16 MED ORDER — SUCRALFATE 1 G PO TABS: 1.0000 g | ORAL_TABLET | Freq: Three times a day (TID) | ORAL | 0 refills | Status: DC

## 2017-11-16 MED ORDER — PANTOPRAZOLE SODIUM 40 MG PO TBEC: 40.0000 mg | DELAYED_RELEASE_TABLET | Freq: Every day | ORAL | 2 refills | Status: DC

## 2017-11-16 MED ORDER — SODIUM CHLORIDE 0.9 % IV BOLUS (SEPSIS)
1000.0000 mL | Freq: Once | INTRAVENOUS | Status: AC
  Administered 2017-11-16: 1000 mL via INTRAVENOUS

## 2017-11-16 MED ORDER — ONDANSETRON HCL 4 MG/2ML IJ SOLN
4.0000 mg | Freq: Once | INTRAMUSCULAR | Status: AC
  Administered 2017-11-16: 4 mg via INTRAVENOUS
  Filled 2017-11-16: qty 2

## 2017-11-16 MED ORDER — INSULIN ASPART 100 UNIT/ML ~~LOC~~ SOLN
10.0000 [IU] | Freq: Once | SUBCUTANEOUS | Status: AC
  Administered 2017-11-16: 10 [IU] via SUBCUTANEOUS
  Filled 2017-11-16: qty 1

## 2017-11-16 MED ORDER — GI COCKTAIL ~~LOC~~
30.0000 mL | Freq: Once | ORAL | Status: AC
  Administered 2017-11-16: 30 mL via ORAL
  Filled 2017-11-16: qty 30

## 2017-11-16 NOTE — ED Triage Notes (Signed)
Pt has been having abd pain for the past 2.5 weeks. Was seen on Thursday and told to come back if pain worsened or was unable to tolerate fluids. Pt reports increased NV since last night, has not taken any medications for nausea because he wasn't prescribed any

## 2017-11-16 NOTE — ED Notes (Signed)
E-signature not functioning.

## 2017-11-16 NOTE — Discharge Planning (Signed)
[place] Kretz J. Clydene Laming, RN, BSN, [place] [phone]  [facility] set up appointment with Cammie Sickle on 3/4@ 2:00.  Spoke with pt at bedside and advised to please arrive 15 min early and take a picture ID and your current medications.  Pt verbalizes understanding of keeping appointment.

## 2017-11-16 NOTE — ED Notes (Signed)
Pt CBG was 312, notified Bonnie(RN)

## 2017-11-16 NOTE — ED Provider Notes (Signed)
[facility] EMERGENCY DEPARTMENT Provider Note   CSN: [id number] Arrival date & time: 11/16/17  0540     History   Chief Complaint Chief Complaint  Patient presents with  . Abdominal Pain    HPI Ryan Medina is a 57 y.o. male.  HPI  Patient is a 57 year old male with a history of type 1 diabetes mellitus, hypertension, dilated cardiomyopathy, and AAA (measuring 3.2 cm on last imaging) abdominal surgical history significant for appendectomy presenting for left-sided abdominal pain and nausea.  Patient reports that he has had progressively worsening pain over the past 2.5 weeks.  Patient reports that the pain is dull all the time but intermittently sharp, and worse right after eating.  Patient reports that he has been unable to tolerate food due to vomiting approximately 30 minutes after eating.  Patient reports he has a long history over many decades of having "stomach problems", which he is attributing to GERD, but reports that this pain is worse compared to his prior abdominal pain episodes.  Patient  denies melena, hematochezia, or hematemesis.  Last bowel movement 2 days ago normal for patient.  Patient denies any fever or chills.  Patient reports frequent urination without dysuria.  No penile discharge.  No testicular pain.  Patient reports that he intermittently gets chest pain episodes, which she treats with nitro, but nothing worsening recently.  Patient reports that he has had one episode of shortness of breath prior to arrival.  No cough. No remedies tried for symptoms.  Past Medical History:  Diagnosis Date  . Back pain, chronic   . Diabetes ([place])   . Dilated cardiomyopathy ([place])    Echo 2/18: EF 45-50, diff HK, Gr 1 DD, trivial AI/TR  . HLD (hyperlipidemia)   . Hypertension   . IDDM (insulin dependent diabetes mellitus) ([place])   . Ruptured lumbar disc     Patient Active Problem List   Diagnosis Date Noted  . Dilated cardiomyopathy ([place])   . Other  hyperlipidemia 11/11/2016  . Abnormal nuclear stress test 05/19/2016  . Atypical angina ([place]) 04/25/2016  . DM (diabetes mellitus), type 2, uncontrolled ([place]) 03/14/2015  . Essential hypertension 03/14/2015  . Hyperglycemia 01/14/2015    Past Surgical History:  Procedure Laterality Date  . ABDOMINAL SURGERY    . APPENDECTOMY    . BACK SURGERY    . CARDIAC CATHETERIZATION N/A 05/19/2016   Procedure: Left Heart Cath and Coronary Angiography;  Surgeon: Leonie Man, MD;  Location: [facility];  Service: Cardiovascular;  Laterality: N/A;       Home Medications    Prior to Admission medications   Medication Sig Start Date End Date Taking? Authorizing Provider  albuterol (PROVENTIL HFA;VENTOLIN HFA) 108 (90 Base) MCG/ACT inhaler Inhale 1-2 puffs into the lungs every 6 (six) hours as needed for wheezing or shortness of breath.    [provider]  amLODipine (NORVASC) 10 MG tablet TAKE 1 TABLET BY MOUTH EVERY DAY 01/29/17   Dorena Dew, FNP  ASPIR-LOW 81 MG EC tablet Take 81 mg by mouth daily. 01/29/17   [provider]  aspirin EC 325 MG tablet Take 1 tablet (325 mg total) by mouth daily. Patient not taking: Reported on 11/12/2017 12/05/16   Cowher, Delice Bison, DO  atorvastatin (LIPITOR) 20 MG tablet Take 1 tablet (20 mg total) by mouth daily. Patient not taking: Reported on 04/29/2017 11/07/16   Dorena Dew, FNP  cyclobenzaprine (FLEXERIL) 10 MG tablet Take 1  tablet (10 mg total) by mouth 2 (two) times daily as needed for muscle spasms. Patient not taking: Reported on 04/29/2017 12/07/16   Jeannett Senior, PA-C  gabapentin (NEURONTIN) 300 MG capsule Take 1 capsule (300 mg total) by mouth 3 (three) times daily. 12/05/16   Dorena Dew, FNP  glucose blood (AGAMATRIX PRESTO TEST) test strip Check blood sugars prior to meals and at bedtime 11/07/16   Dorena Dew, FNP  hydrochlorothiazide (MICROZIDE) 12.5 MG capsule Take 1 capsule (12.5 mg total) by  mouth daily. 11/12/16 11/12/17  Richardson Dopp T, PA-C  HYDROcodone-acetaminophen (NORCO/VICODIN) 5-325 MG tablet Take 1 tablet by mouth every 6 (six) hours as needed. Patient not taking: Reported on 10/01/2017 03/17/17   Drenda Freeze, MD  insulin aspart (NOVOLOG) 100 unit/mL injection Inject 1-10 Units into the skin 3 (three) times daily before meals. PER SLIDING SCALE    [provider]  insulin detemir (LEVEMIR) 100 UNIT/ML injection INJECT 30  UNITS in the morning and 20 UNITS at bedtime 12/05/16   Dorena Dew, FNP  isosorbide mononitrate (IMDUR) 30 MG 24 hr tablet Take 1 tablet (30 mg total) by mouth daily. Patient not taking: Reported on 11/12/2017 11/12/16 10/01/17  Richardson Dopp T, PA-C  meloxicam (MOBIC) 15 MG tablet TAKE 1 TABLET BY MOUTH EVERY DAY Patient not taking: Reported on 11/12/2017 01/29/17   Dorena Dew, FNP  metFORMIN (GLUCOPHAGE) 1000 MG tablet TAKE 1 TABLET BY MOUTH 2 TIMES DAILY WITH A MEAL 01/29/17   Hollis, Asencion Partridge, FNP  NITROSTAT 0.4 MG SL tablet DISSOLVE ONE TABLET UNDER THE TONGUE EVERY 5 MINUTES AS NEEDED FOR CHEST PAIN UP TO 3 DOSES 01/29/17   Dorena Dew, FNP  ondansetron (ZOFRAN ODT) 8 MG disintegrating tablet Take 1 tablet (8 mg total) by mouth every 8 (eight) hours as needed for nausea or vomiting. Patient not taking: Reported on 04/29/2017 11/27/16   Jeannett Senior, PA-C  ondansetron (ZOFRAN) 4 MG tablet Take 1 tablet (4 mg total) by mouth every 6 (six) hours. Patient not taking: Reported on 04/29/2017 11/29/16   Virgel Manifold, MD  pantoprazole (PROTONIX) 20 MG tablet Take 1 tablet (20 mg total) by mouth daily. Patient not taking: Reported on 04/29/2017 11/29/16   Virgel Manifold, MD  pantoprazole ([place]) 40 MG tablet TAKE 1 TABLET BY MOUTH EVERY DAY Patient not taking: Reported on 04/29/2017 01/29/17   Dorena Dew, FNP    Family History Family History  Problem Relation Age of Onset  . Diabetes Mother   . Heart failure Mother   .  Diabetes Sister   . Diabetes Brother     Social History Social History   Tobacco Use  . Smoking status: Current Every Day Smoker    Packs/day: 0.25    Years: 25.00    Pack years: 6.25    Types: Cigarettes  . Smokeless tobacco: Never Used  . Tobacco comment: 2 per day   Substance Use Topics  . Alcohol use: No  . Drug use: No     Allergies   Lisinopril   Review of Systems Review of Systems  Constitutional: Negative for chills and fever.  HENT: Negative for congestion, rhinorrhea, sinus pain and sore throat.   Eyes: Negative for visual disturbance.  Respiratory: Negative for cough, chest tightness and shortness of breath.   Cardiovascular: Negative for chest pain.  Gastrointestinal: Positive for abdominal pain, nausea and vomiting. Negative for blood in stool, constipation and diarrhea.  Genitourinary: Positive for frequency.  Negative for dysuria and flank pain.  Musculoskeletal: Negative for back pain and myalgias.  Skin: Negative for rash.  Neurological: Positive for light-headedness. Negative for syncope.     Physical Exam Updated Vital Signs BP (!) 121/93 (BP Location: Right Arm)   Pulse 95   Temp 98.6 F (37 C) (Oral)   Resp 18   SpO2 99%   Physical Exam  Constitutional: He appears well-developed and well-nourished. No distress.  HENT:  Head: Normocephalic and atraumatic.  Mouth/Throat: Oropharynx is clear and moist.  Eyes: Conjunctivae and EOM are normal. Pupils are equal, round, and reactive to light.  Neck: Normal range of motion. Neck supple.  Cardiovascular: Normal rate, regular rhythm, S1 normal, S2 normal and intact distal pulses.  No murmur heard. Intact, 2+ DP pulses bilaterally.  Pulmonary/Chest: Effort normal and breath sounds normal. He has no wheezes. He has no rales.  Abdominal: Soft. Bowel sounds are normal. He exhibits no distension. There is tenderness. There is no guarding.  Tenderness most pronounced left upper quadrant and  epigastrium.  No rebound.  Musculoskeletal: Normal range of motion. He exhibits no edema or deformity.  Lymphadenopathy:    He has no cervical adenopathy.  Neurological: He is alert.  Cranial nerves grossly intact. Patient moves extremities symmetrically and with good coordination.  Skin: Skin is warm and dry. No rash noted. No erythema.  Psychiatric: He has a normal mood and affect. His behavior is normal. Judgment and thought content normal.  Nursing note and vitals reviewed.    ED Treatments / Results  Labs (all labs ordered are listed, but only abnormal results are displayed) Labs Reviewed  LIPASE, BLOOD - Abnormal; Notable for the following components:      Result Value   Lipase 66 (*)    All other components within normal limits  COMPREHENSIVE METABOLIC PANEL - Abnormal; Notable for the following components:   Chloride 97 (*)    Glucose, Bld 454 (*)    Albumin 3.3 (*)    AST 14 (*)    ALT 16 (*)    All other components within normal limits  URINALYSIS, ROUTINE W REFLEX MICROSCOPIC - Abnormal; Notable for the following components:   APPearance HAZY (*)    Specific Gravity, Urine 1.038 (*)    Glucose, UA >=500 (*)    Hgb urine dipstick SMALL (*)    Ketones, ur 5 (*)    Protein, ur 30 (*)    Leukocytes, UA TRACE (*)    Bacteria, UA RARE (*)    Squamous Epithelial / LPF 0-5 (*)    All other components within normal limits  URINE CULTURE  CBC  I-STAT TROPONIN, ED  I-STAT CG4 LACTIC ACID, ED    EKG  EKG Interpretation  Date/Time:  Monday November 16 2017 08:29:07 EST Ventricular Rate:  59 PR Interval:  160 QRS Duration: 92 QT Interval:  408 QTC Calculation: 403 R Axis:   74 Text Interpretation:  Sinus bradycardia Nonspecific T wave abnormality Abnormal ECG No significant change since last tracing Confirmed by Wandra Arthurs [id number] on 11/16/2017 9:40:36 AM       Radiology No results found.  Procedures Procedures (including critical care  time)  Medications Ordered in ED Medications  ondansetron (ZOFRAN) injection 4 mg (not administered)  gi cocktail (Maalox,Lidocaine,Donnatal) (not administered)  sodium chloride 0.9 % bolus 1,000 mL (not administered)  iopamidol (ISOVUE-370) 76 % injection (not administered)     Initial Impression / Assessment and Plan / ED Course  I have reviewed the triage vital signs and the nursing notes.  Pertinent labs & imaging results that were available during my care of the patient were reviewed by me and considered in my medical decision making (see chart for details).     Patient is nontoxic-appearing, afebrile, and in no acute distress at this time.  Lab work thus far demonstrated no leukocytosis.  Electrolytes within normal limits and no evidence of anion gap.  Glucose is 454.  Urinalysis demonstrates TNTC WBCs, but negative nitrite.  Rare bacteria.  Will obtain UCx but do not suspect infection as cause of patient's symptoms, and CT scan from 4 days ago demonstrates no perinephric stranding. Patient's prior CT abdomen pelvis renal stone study 4 days ago demonstrated moderately distended bladder.  Will obtain bladder scan postvoid residual to assess for retention. Workup otherwise significant for lipase of 66, which is decreased from 86 on 11-12-2017. No leukocytosis.  Cardiac workup demonstrates no ischemic findings.  EKG, read by me demonstrates no change from prior.  Due to symptoms associated with eating and vomiting after eating, will initiate CT angiogram to assess for chronic mesenteric ischemia.  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  At this time, do not suspect ruptured AAA given equal pulses in the lower extremities, and stable 32 mm AAA on CT renal stone study on 11-12-2017.  No changes on CT from 4 days ago demonstrating inflammatory changes associated with diverticulitis.  Post-void residual demonstrates 180 mL.  Do not suspect significant  retention as cause of patient's symptoms. Suspect gastritis versus peptic ulcer disease of patient's symptoms.  Enlisted assistance of case management and obtaining patient close follow-up with primary care provider, and placed referrals to gastroenterology and vascular surgery, per radiology recommendation.  Patient reported improvement with GI cocktail and Zofran, and able to perform p.o. challenge without difficulty.  Patient discharged with refill of Protonix as well as Carafate and Bentyl.  Patient can return precautions for any worsening abdominal pain, intractable nausea or vomiting, or fever or chills with abdominal pain.  Patient is in understanding and agrees with the plan of care.  This is a supervised visit with Dr. Shirlyn Goltz. Evaluation, management, and discharge planning discussed with this attending physician.  Final Clinical Impressions(s) / ED Diagnoses   Final diagnoses:  Left sided abdominal pain  Epigastric pain  Nausea and vomiting, intractability of vomiting not specified, unspecified vomiting type    ED Discharge Orders    None       Tamala Julian 11/16/17 1253    Drenda Freeze, MD 11/17/17 [phone]

## 2017-11-16 NOTE — Discharge Instructions (Signed)
Please see the information and instructions below regarding your visit.  Your diagnoses today include:  1. Left sided abdominal pain   2. Epigastric pain   3. Nausea and vomiting, intractability of vomiting not specified, unspecified vomiting type     Your exam and testing today is reassuring that there is not a condition causing your abdominal pain that we immediately need to intervene on at this time.   Abdominal (belly) pain can be caused by many things. Your caregiver performed an examination and possibly ordered blood/urine tests and imaging (CT scan, x-rays, ultrasound). Many cases can be observed and treated at home after initial evaluation in the emergency department. Even though you are being discharged home, abdominal pain can be unpredictable. Therefore, you need a repeated exam if your pain does not resolve, returns, or worsens. Most patients with abdominal pain don't have to be admitted to the hospital or have surgery, but serious problems like appendicitis and gallbladder attacks can start out as nonspecific pain. Many abdominal conditions cannot be diagnosed in one visit, so follow-up evaluations are very important.  Tests performed today include: Blood counts and electrolytes Blood tests to check liver and kidney function Blood tests to check pancreas function Urine test to look for infection and pregnancy (in women) Vital signs. See below for your results today.   See side panel of your discharge paperwork for testing performed today. Vital signs are listed at the bottom of these instructions.   Medications prescribed:    Take any prescribed medications only as prescribed, and any over the counter medications only as directed on the packaging.  Carafate.  This is a medicine to coat the lining of the stomach.  You may take this up to 4 times daily for 1 week.  Bentyl.  This is a medicine to reduce spasming in the abdomen.  This can be taken twice daily.  Protonix.  This  is taken once daily in the morning on an empty stomach.  Home care instructions:  Try eating, but start with foods that have a lot of fluid in them. Good examples are soup, Jell-O, and popsicles. If you do OK with those foods, you can try soft, bland foods, such as plain yogurt. Foods that are high in carbohydrates ("carbs"), like bread or saltine crackers, can help settle your stomach. Some people also find that ginger helps with nausea. You should avoid foods that have a lot of fat in them. They can make nausea worse. Call your doctor if your symptoms come back when you try to eat.  Please follow any educational materials contained in this packet.   Follow-up instructions: Please follow-up with your primary care provider on 11/23/17 for further evaluation of your symptoms if they are not completely improved.   Please follow up with gastroenterology about your abdominal pain.   Please follow-up with the vascular surgeon I listed regarding the aneurysms, which are enlarging parts of the arteries in your abdomen.  I spoke with case management about getting you into see this doctor.  Return instructions:  Please return to the Emergency Department if you experience worsening symptoms.  SEEK IMMEDIATE MEDICAL ATTENTION IF: The pain does not go away or becomes severe  A temperature above 101F develops  Repeated vomiting occurs (multiple episodes)  The pain becomes localized to portions of the abdomen. The right side could possibly be appendicitis. In an adult, the left lower portion of the abdomen could be colitis or diverticulitis.  Blood is being passed in  stools or vomit (bright red or black tarry stools)  You develop chest pain, difficulty breathing, dizziness or fainting, or become confused, poorly responsive, or inconsolable (young children) If you have any other emergent concerns regarding your health  Additional Information:   Your vital signs today were: BP (!) 121/93 (BP Location:  Right Arm)    Pulse 95    Temp 98.6 F (37 C) (Oral)    Resp 18    SpO2 99%  If your blood pressure (BP) was elevated on multiple readings during this visit above 130 for the top number or above 80 for the bottom number, please have this repeated by your primary care provider within one month. --------------  Thank you for allowing [place] to participate in your care today.

## 2017-11-17 LAB — URINE CULTURE

## 2017-11-18 ENCOUNTER — Telehealth: Payer: Self-pay | Admitting: Emergency Medicine

## 2017-11-18 NOTE — Telephone Encounter (Signed)
Post ED Visit - Positive Culture Follow-up  Culture report reviewed by antimicrobial stewardship pharmacist:  []  Elenor Quinones, Pharm.D. []  Heide Guile, Pharm.D., BCPS AQ-ID []  Parks Neptune, Pharm.D., BCPS [x]  Alycia Rossetti, Pharm.D., BCPS []  [place], Pharm.D., BCPS, AAHIVP []  Legrand Como, Pharm.D., BCPS, AAHIVP []  Salome Arnt, PharmD, BCPS []  Jalene Mullet, PharmD []  Vincenza Hews, PharmD, BCPS  Positive urine culture Treated with none, no further patient follow-up is required at this time.  Hazle Nordmann 11/18/2017, 3:49 PM

## 2017-11-23 ENCOUNTER — Ambulatory Visit: Payer: Self-pay | Admitting: Family Medicine

## 2017-12-15 ENCOUNTER — Encounter: Payer: Self-pay | Admitting: Physician Assistant

## 2017-12-19 ENCOUNTER — Encounter (HOSPITAL_COMMUNITY): Payer: Self-pay | Admitting: Emergency Medicine

## 2017-12-19 ENCOUNTER — Emergency Department (HOSPITAL_COMMUNITY)
Admission: EM | Admit: 2017-12-19 | Discharge: 2017-12-19 | Disposition: A | Discharge status: Home / Self Care | Payer: Medicaid Other | Attending: Emergency Medicine | Admitting: Emergency Medicine

## 2017-12-19 ENCOUNTER — Emergency Department (HOSPITAL_COMMUNITY): Payer: Medicaid Other

## 2017-12-19 DIAGNOSIS — Z79899 Other long term (current) drug therapy: Secondary | ICD-10-CM | POA: Insufficient documentation

## 2017-12-19 DIAGNOSIS — E119 Type 2 diabetes mellitus without complications: Secondary | ICD-10-CM | POA: Diagnosis not present

## 2017-12-19 DIAGNOSIS — R112 Nausea with vomiting, unspecified: Secondary | ICD-10-CM | POA: Diagnosis present

## 2017-12-19 DIAGNOSIS — Z7982 Long term (current) use of aspirin: Secondary | ICD-10-CM | POA: Diagnosis not present

## 2017-12-19 DIAGNOSIS — I10 Essential (primary) hypertension: Secondary | ICD-10-CM | POA: Diagnosis not present

## 2017-12-19 DIAGNOSIS — F17210 Nicotine dependence, cigarettes, uncomplicated: Secondary | ICD-10-CM | POA: Insufficient documentation

## 2017-12-19 DIAGNOSIS — K2900 Acute gastritis without bleeding: Secondary | ICD-10-CM | POA: Diagnosis not present

## 2017-12-19 DIAGNOSIS — Z794 Long term (current) use of insulin: Secondary | ICD-10-CM | POA: Diagnosis not present

## 2017-12-19 LAB — CBC
HCT: 47.8 % (ref 39.0–52.0)
HEMOGLOBIN: 16.2 g/dL (ref 13.0–17.0)
MCH: 29.7 pg (ref 26.0–34.0)
MCHC: 33.9 g/dL (ref 30.0–36.0)
MCV: 87.5 fL (ref 78.0–100.0)
PLATELETS: 337 10*3/uL (ref 150–400)
RBC: 5.46 MIL/uL (ref 4.22–5.81)
RDW: 12.4 % (ref 11.5–15.5)
WBC: 6.2 10*3/uL (ref 4.0–10.5)

## 2017-12-19 LAB — COMPREHENSIVE METABOLIC PANEL
ALK PHOS: 104 U/L (ref 38–126)
ALT: 18 U/L (ref 17–63)
ANION GAP: 17 — AB (ref 5–15)
AST: 15 U/L (ref 15–41)
Albumin: 3.9 g/dL (ref 3.5–5.0)
BILIRUBIN TOTAL: 1.4 mg/dL — AB (ref 0.3–1.2)
BUN: 19 mg/dL (ref 6–20)
CALCIUM: 9.7 mg/dL (ref 8.9–10.3)
CO2: 25 mmol/L (ref 22–32)
Chloride: 94 mmol/L — ABNORMAL LOW (ref 101–111)
Creatinine, Ser: 1.25 mg/dL — ABNORMAL HIGH (ref 0.61–1.24)
GFR calc non Af Amer: >60 mL/min (ref >60)
GLUCOSE: 353 mg/dL — AB (ref 65–99)
Potassium: 3.9 mmol/L (ref 3.5–5.1)
Sodium: 136 mmol/L (ref 135–145)
TOTAL PROTEIN: 7.7 g/dL (ref 6.5–8.1)

## 2017-12-19 LAB — LIPASE, BLOOD: Lipase: 74 U/L — ABNORMAL HIGH (ref 11–51)

## 2017-12-19 MED ORDER — SODIUM CHLORIDE 0.9 % IV BOLUS (SEPSIS)
1000.0000 mL | Freq: Once | INTRAVENOUS | Status: AC
  Administered 2017-12-19: 1000 mL via INTRAVENOUS

## 2017-12-19 MED ORDER — ONDANSETRON 4 MG PO TBDP: 4.0000 mg | ORAL_TABLET | Freq: Three times a day (TID) | ORAL | 0 refills | Status: DC | PRN

## 2017-12-19 MED ORDER — DICYCLOMINE HCL 10 MG/ML IM SOLN
20.0000 mg | Freq: Once | INTRAMUSCULAR | Status: AC
  Administered 2017-12-19: 20 mg via INTRAMUSCULAR
  Filled 2017-12-19: qty 2

## 2017-12-19 MED ORDER — HALOPERIDOL LACTATE 5 MG/ML IJ SOLN
4.0000 mg | Freq: Once | INTRAMUSCULAR | Status: AC
  Administered 2017-12-19: 4 mg via INTRAVENOUS
  Filled 2017-12-19: qty 1

## 2017-12-19 MED ORDER — IOPAMIDOL (ISOVUE-300) INJECTION 61%
INTRAVENOUS | Status: AC
  Administered 2017-12-19: 100 mL via INTRAVENOUS
  Filled 2017-12-19: qty 100

## 2017-12-19 MED ORDER — ONDANSETRON 4 MG PO TBDP: 4.0000 mg | ORAL_TABLET | Freq: Once | ORAL | Status: DC | PRN

## 2017-12-19 NOTE — ED Notes (Signed)
Pt endorsing shortness of breath at this time. Hx of cardiomyopathy noted.

## 2017-12-19 NOTE — ED Triage Notes (Signed)
Pt to ER for 2 days of nausea and vomiting. Reports unable to tolerate PO fluids/food, reports abdominal pain that originated in left abdomen and now is generalized. Pt in NAD. A/o x4.

## 2017-12-19 NOTE — ED Provider Notes (Signed)
[facility] EMERGENCY DEPARTMENT Provider Note   CSN: [id number] Arrival date & time: 12/19/17  1102     History   Chief Complaint Chief Complaint  Patient presents with  . Abdominal Pain  . Nausea  . Emesis    HPI Ryan Medina is a 57 y.o. male.  HPI   57yo male with history of cardiomyopathy, DM, htn, hlpd, presents with concern for nausea, vomiting and abdominal pain.  Symptoms present for 2 days. Vomiting approx 5 times per day. Unable to tolerate po fluids or food for last 2 days.  Left upper quadrant and left lower abdominal pain.  No diarrhea.  NO urinary symptoms.   Past Medical History:  Diagnosis Date  . Back pain, chronic   . Diabetes ([place])   . Dilated cardiomyopathy ([place])    Echo 2/18: EF 45-50, diff HK, Gr 1 DD, trivial AI/TR  . HLD (hyperlipidemia)   . Hypertension   . IDDM (insulin dependent diabetes mellitus) ([place])   . Ruptured lumbar disc     Patient Active Problem List   Diagnosis Date Noted  . Dilated cardiomyopathy ([place])   . Other hyperlipidemia 11/11/2016  . Abnormal nuclear stress test 05/19/2016  . Atypical angina ([place]) 04/25/2016  . DM (diabetes mellitus), type 2, uncontrolled ([place]) 03/14/2015  . Essential hypertension 03/14/2015  . Hyperglycemia 01/14/2015    Past Surgical History:  Procedure Laterality Date  . ABDOMINAL SURGERY    . APPENDECTOMY    . BACK SURGERY    . CARDIAC CATHETERIZATION N/A 05/19/2016   Procedure: Left Heart Cath and Coronary Angiography;  Surgeon: Leonie Man, MD;  Location: [facility];  Service: Cardiovascular;  Laterality: N/A;        Home Medications    Prior to Admission medications   Medication Sig Start Date End Date Taking? Authorizing Provider  albuterol (PROVENTIL HFA;VENTOLIN HFA) 108 (90 Base) MCG/ACT inhaler Inhale 1-2 puffs into the lungs every 6 (six) hours as needed for wheezing or shortness of breath.   Yes [provider]  amLODipine (NORVASC) 10  MG tablet TAKE 1 TABLET BY MOUTH EVERY DAY Patient taking differently: TAKE 1 TABLET (10 MG TOTALLY) BY MOUTH EVERY DAY 01/29/17  Yes Dorena Dew, FNP  ASPIR-LOW 81 MG EC tablet Take 81 mg by mouth daily. 01/29/17  Yes [provider]  gabapentin (NEURONTIN) 300 MG capsule Take 1 capsule (300 mg total) by mouth 3 (three) times daily. 12/05/16  Yes Dorena Dew, FNP  hydrochlorothiazide (MICROZIDE) 12.5 MG capsule Take 1 capsule (12.5 mg total) by mouth daily. 11/12/16 12/19/17 Yes Weaver, Scott T, PA-C  insulin detemir (LEVEMIR) 100 UNIT/ML injection INJECT 30  UNITS in the morning and 20 UNITS at bedtime Patient taking differently: Inject 20 Units into the skin every morning.  12/05/16  Yes Dorena Dew, FNP  meloxicam (MOBIC) 15 MG tablet TAKE 1 TABLET BY MOUTH EVERY DAY Patient taking differently: TAKE 1 TABLET (15 MG TOTALLY) BY MOUTH EVERY DAY 01/29/17  Yes Dorena Dew, FNP  metFORMIN (GLUCOPHAGE) 1000 MG tablet TAKE 1 TABLET BY MOUTH 2 TIMES DAILY WITH A MEAL Patient taking differently: TAKE 1 TABLET (1000 MG TOTALLY) BY MOUTH 2 TIMES DAILY WITH A MEAL 01/29/17  Yes Hollis, Asencion Partridge, FNP  NITROSTAT 0.4 MG SL tablet DISSOLVE ONE TABLET UNDER THE TONGUE EVERY 5 MINUTES AS NEEDED FOR CHEST PAIN UP TO 3 DOSES 01/29/17  Yes Dorena Dew, FNP  aspirin EC  325 MG tablet Take 1 tablet (325 mg total) by mouth daily. Patient not taking: Reported on 11/12/2017 12/05/16   Ponciano, Delice Bison, DO  atorvastatin (LIPITOR) 20 MG tablet Take 1 tablet (20 mg total) by mouth daily. Patient not taking: Reported on 04/29/2017 11/07/16   Dorena Dew, FNP  cyclobenzaprine (FLEXERIL) 10 MG tablet Take 1 tablet (10 mg total) by mouth 2 (two) times daily as needed for muscle spasms. Patient not taking: Reported on 04/29/2017 12/07/16   Jeannett Senior, PA-C  dicyclomine (BENTYL) 20 MG tablet Take 1 tablet (20 mg total) by mouth 2 (two) times daily. 11/16/17   Langston Masker B, PA-C  glucose  blood (AGAMATRIX PRESTO TEST) test strip Check blood sugars prior to meals and at bedtime 11/07/16   Dorena Dew, FNP  HYDROcodone-acetaminophen (NORCO/VICODIN) 5-325 MG tablet Take 1 tablet by mouth every 6 (six) hours as needed. Patient not taking: Reported on 10/01/2017 03/17/17   Drenda Freeze, MD  isosorbide mononitrate (IMDUR) 30 MG 24 hr tablet Take 1 tablet (30 mg total) by mouth daily. Patient not taking: Reported on 11/12/2017 11/12/16 10/01/17  Richardson Dopp T, PA-C  ondansetron (ZOFRAN ODT) 4 MG disintegrating tablet Take 1 tablet (4 mg total) by mouth every 8 (eight) hours as needed for nausea or vomiting. 12/19/17   Gareth Morgan, MD  ondansetron (ZOFRAN ODT) 8 MG disintegrating tablet Take 1 tablet (8 mg total) by mouth every 8 (eight) hours as needed for nausea or vomiting. Patient not taking: Reported on 04/29/2017 11/27/16   Jeannett Senior, PA-C  ondansetron (ZOFRAN) 4 MG tablet Take 1 tablet (4 mg total) by mouth every 6 (six) hours. Patient not taking: Reported on 04/29/2017 11/29/16   Virgel Manifold, MD  pantoprazole (PROTONIX) 40 MG tablet Take 1 tablet (40 mg total) by mouth daily. Patient not taking: Reported on 12/19/2017 11/16/17   Langston Masker B, PA-C  sucralfate (CARAFATE) 1 g tablet Take 1 tablet (1 g total) by mouth 4 (four) times daily -  with meals and at bedtime for 7 days. Patient not taking: Reported on 12/19/2017 11/16/17 11/23/17  Albesa Seen, PA-C    Family History Family History  Problem Relation Age of Onset  . Diabetes Mother   . Heart failure Mother   . Diabetes Sister   . Diabetes Brother     Social History Social History   Tobacco Use  . Smoking status: Current Every Day Smoker    Packs/day: 0.25    Years: 25.00    Pack years: 6.25    Types: Cigarettes  . Smokeless tobacco: Never Used  . Tobacco comment: 2 per day   Substance Use Topics  . Alcohol use: No  . Drug use: No     Allergies   Lisinopril   Review of  Systems Review of Systems  Constitutional: Negative for fever.  HENT: Negative for sore throat.   Eyes: Negative for visual disturbance.  Respiratory: Negative for shortness of breath.   Cardiovascular: Negative for chest pain.  Gastrointestinal: Positive for abdominal pain, nausea and vomiting. Negative for constipation and diarrhea.  Genitourinary: Negative for difficulty urinating.  Musculoskeletal: Negative for back pain and neck stiffness.  Skin: Negative for rash.  Neurological: Negative for syncope and headaches.     Physical Exam Updated Vital Signs BP 119/90   Pulse 85   Temp 98.3 F (36.8 C) (Oral)   Resp 18   SpO2 96%   Physical Exam  Constitutional: He is oriented  to person, place, and time. He appears well-developed and well-nourished. No distress.  HENT:  Head: Normocephalic and atraumatic.  Eyes: Conjunctivae and EOM are normal.  Neck: Normal range of motion.  Cardiovascular: Normal rate, regular rhythm, normal heart sounds and intact distal pulses. Exam reveals no gallop and no friction rub.  No murmur heard. Pulmonary/Chest: Effort normal and breath sounds normal. No respiratory distress. He has no wheezes. He has no rales.  Abdominal: Soft. He exhibits no distension. There is tenderness in the left lower quadrant. There is no guarding.  Musculoskeletal: He exhibits no edema.  Neurological: He is alert and oriented to person, place, and time.  Skin: Skin is warm and dry. He is not diaphoretic.  Nursing note and vitals reviewed.    ED Treatments / Results  Labs (all labs ordered are listed, but only abnormal results are displayed) Labs Reviewed  LIPASE, BLOOD - Abnormal; Notable for the following components:      Result Value   Lipase 74 (*)    All other components within normal limits  COMPREHENSIVE METABOLIC PANEL - Abnormal; Notable for the following components:   Chloride 94 (*)    Glucose, Bld 353 (*)    Creatinine, Ser 1.25 (*)    Total  Bilirubin 1.4 (*)    Anion gap 17 (*)    All other components within normal limits  CBC  URINALYSIS, ROUTINE W REFLEX MICROSCOPIC    EKG EKG Interpretation  Date/Time:  Saturday December 19 2017 11:15:31 EDT Ventricular Rate:  122 PR Interval:  128 QRS Duration: 80 QT Interval:  338 QTC Calculation: 481 R Axis:   75 Text Interpretation:  Sinus tachycardia Biatrial enlargement Left ventricular hypertrophy with repolarization abnormality Abnormal ECG Since prior ECG< rate has increased Confirmed by Gareth Morgan [phone]) on 12/19/2017 4:20:22 PM   Radiology Ct Abdomen Pelvis W Contrast  Result Date: 12/19/2017 CLINICAL DATA:  Left-sided abdominal pain. Nausea and vomiting. Diverticulitis suspected. EXAM: CT ABDOMEN AND PELVIS WITH CONTRAST TECHNIQUE: Multidetector CT imaging of the abdomen and pelvis was performed using the standard protocol following bolus administration of intravenous contrast. CONTRAST:  134mL ISOVUE-300 IOPAMIDOL (ISOVUE-300) INJECTION 61% COMPARISON:  11/16/2017 abdominopelvic CTA FINDINGS: Lower chest: mild centrilobular emphysema. Anterior right lung base 2 mm nodule was present back on 10/14/2016 and can be presumed benign. Normal heart size without pericardial or pleural effusion. Hepatobiliary: Subcentimeter left hepatic lobe cyst. Normal gallbladder, without biliary ductal dilatation. Pancreas: Normal, without mass or ductal dilatation. Spleen: Normal in size, without focal abnormality. Adrenals/Urinary Tract: Normal adrenal glands. Lower pole left renal too small to characterize lesion. Normal right kidney, without hydronephrosis. Normal urinary bladder. Stomach/Bowel: Proximal gastric wall thickening at 2.4 cm on image 13/3. This area is underdistended. Colonic stool burden suggests constipation. Normal appendix. Normal small bowel loops. Vascular/Lymphatic: Infrarenal abdominal aortic ectasia is similar at 3.0 cm. Aneurysmal dilatation of the common iliacs  bilaterally. Example at 2.9 cm on the right and 2.7 cm on the left, similar. No abdominopelvic adenopathy. Reproductive: Normal prostate. Other: No significant free fluid.  No free intraperitoneal air. Musculoskeletal: Lumbosacral spondylosis with degenerative disc disease and trace retrolisthesis at L5-S1. IMPRESSION: 1. Greater curvature gastric wall thickening is suspicious for gastritis. 2.  Possible constipation. 3. Aortic Atherosclerosis (ICD10-I70.0). Similar appearance of infrarenal aortic ectasia and bilateral common iliac artery aneurysms. 4.  Emphysema (ICD10-J43.9). Electronically Signed   By: Abigail Miyamoto M.D.   On: 12/19/2017 15:22    Procedures Procedures (including critical care time)  Medications Ordered in ED Medications  ondansetron (ZOFRAN-ODT) disintegrating tablet 4 mg (has no administration in time range)  sodium chloride 0.9 % bolus 1,000 mL (1,000 mLs Intravenous New Bag/Given 12/19/17 1350)  haloperidol lactate (HALDOL) injection 4 mg (4 mg Intravenous Given 12/19/17 1400)  dicyclomine (BENTYL) injection 20 mg (20 mg Intramuscular Given 12/19/17 1400)  iopamidol (ISOVUE-300) 61 % injection (100 mLs Intravenous Contrast Given 12/19/17 1439)     Initial Impression / Assessment and Plan / ED Course  I have reviewed the triage vital signs and the nursing notes.  Pertinent labs & imaging results that were available during my care of the patient were reviewed by me and considered in my medical decision making (see chart for details).     57yo male with history of cardiomyopathy, DM, htn, hlpd, presents with concern for nausea, vomiting and abdominal pain.  CT abdomen pelvis shows findings suspicious for gastritis.  Normal bicarb, no dka.  No urinary symptoms. Improved after haldol and bentyl, able to tolerate po.  Suspect gastritis, possible gastroparesis.  Recommend pantoprazole, given rx for zofran. Patient discharged in stable condition with understanding of reasons to  return.   Final Clinical Impressions(s) / ED Diagnoses   Final diagnoses:  Non-intractable vomiting with nausea, unspecified vomiting type  Acute gastritis without hemorrhage, unspecified gastritis type    ED Discharge Orders        Ordered    ondansetron (ZOFRAN ODT) 4 MG disintegrating tablet  Every 8 hours PRN     12/19/17 1703       Gareth Morgan, MD 12/19/17 2250

## 2017-12-19 NOTE — ED Notes (Signed)
Patient transported to CT 

## 2018-01-29 ENCOUNTER — Encounter (HOSPITAL_COMMUNITY): Payer: Self-pay | Admitting: Emergency Medicine

## 2018-01-29 ENCOUNTER — Other Ambulatory Visit: Payer: Self-pay

## 2018-01-29 ENCOUNTER — Observation Stay (HOSPITAL_COMMUNITY)
Admission: EM | Admit: 2018-01-29 | Discharge: 2018-01-31 | Disposition: A | Discharge status: Home / Self Care | Payer: Medicaid Other | Attending: Family Medicine | Admitting: Family Medicine

## 2018-01-29 ENCOUNTER — Emergency Department (HOSPITAL_COMMUNITY): Payer: Medicaid Other

## 2018-01-29 DIAGNOSIS — E860 Dehydration: Secondary | ICD-10-CM | POA: Insufficient documentation

## 2018-01-29 DIAGNOSIS — E1165 Type 2 diabetes mellitus with hyperglycemia: Secondary | ICD-10-CM | POA: Diagnosis not present

## 2018-01-29 DIAGNOSIS — I10 Essential (primary) hypertension: Secondary | ICD-10-CM | POA: Diagnosis not present

## 2018-01-29 DIAGNOSIS — E1100 Type 2 diabetes mellitus with hyperosmolarity without nonketotic hyperglycemic-hyperosmolar coma (NKHHC): Secondary | ICD-10-CM | POA: Diagnosis not present

## 2018-01-29 DIAGNOSIS — R000 Tachycardia, unspecified: Secondary | ICD-10-CM | POA: Diagnosis not present

## 2018-01-29 DIAGNOSIS — R079 Chest pain, unspecified: Secondary | ICD-10-CM | POA: Diagnosis present

## 2018-01-29 DIAGNOSIS — R739 Hyperglycemia, unspecified: Secondary | ICD-10-CM

## 2018-01-29 DIAGNOSIS — F17210 Nicotine dependence, cigarettes, uncomplicated: Secondary | ICD-10-CM | POA: Diagnosis not present

## 2018-01-29 DIAGNOSIS — Z791 Long term (current) use of non-steroidal anti-inflammatories (NSAID): Secondary | ICD-10-CM | POA: Insufficient documentation

## 2018-01-29 DIAGNOSIS — Z79899 Other long term (current) drug therapy: Secondary | ICD-10-CM | POA: Diagnosis not present

## 2018-01-29 DIAGNOSIS — M47816 Spondylosis without myelopathy or radiculopathy, lumbar region: Secondary | ICD-10-CM | POA: Diagnosis not present

## 2018-01-29 DIAGNOSIS — I714 Abdominal aortic aneurysm, without rupture, unspecified: Secondary | ICD-10-CM | POA: Diagnosis present

## 2018-01-29 DIAGNOSIS — D649 Anemia, unspecified: Secondary | ICD-10-CM | POA: Diagnosis not present

## 2018-01-29 DIAGNOSIS — Z7982 Long term (current) use of aspirin: Secondary | ICD-10-CM | POA: Insufficient documentation

## 2018-01-29 DIAGNOSIS — I071 Rheumatic tricuspid insufficiency: Secondary | ICD-10-CM | POA: Insufficient documentation

## 2018-01-29 DIAGNOSIS — R1084 Generalized abdominal pain: Secondary | ICD-10-CM | POA: Insufficient documentation

## 2018-01-29 DIAGNOSIS — I420 Dilated cardiomyopathy: Secondary | ICD-10-CM | POA: Diagnosis present

## 2018-01-29 DIAGNOSIS — Z794 Long term (current) use of insulin: Secondary | ICD-10-CM | POA: Insufficient documentation

## 2018-01-29 DIAGNOSIS — I255 Ischemic cardiomyopathy: Secondary | ICD-10-CM | POA: Insufficient documentation

## 2018-01-29 DIAGNOSIS — I493 Ventricular premature depolarization: Secondary | ICD-10-CM | POA: Diagnosis not present

## 2018-01-29 DIAGNOSIS — I2510 Atherosclerotic heart disease of native coronary artery without angina pectoris: Secondary | ICD-10-CM | POA: Diagnosis not present

## 2018-01-29 DIAGNOSIS — E0800 Diabetes mellitus due to underlying condition with hyperosmolarity without nonketotic hyperglycemic-hyperosmolar coma (NKHHC): Secondary | ICD-10-CM | POA: Diagnosis present

## 2018-01-29 DIAGNOSIS — Z888 Allergy status to other drugs, medicaments and biological substances status: Secondary | ICD-10-CM | POA: Insufficient documentation

## 2018-01-29 DIAGNOSIS — R112 Nausea with vomiting, unspecified: Secondary | ICD-10-CM | POA: Diagnosis present

## 2018-01-29 DIAGNOSIS — E785 Hyperlipidemia, unspecified: Secondary | ICD-10-CM | POA: Insufficient documentation

## 2018-01-29 DIAGNOSIS — Z720 Tobacco use: Secondary | ICD-10-CM | POA: Diagnosis present

## 2018-01-29 LAB — BASIC METABOLIC PANEL
ANION GAP: 9 (ref 5–15)
BUN: 17 mg/dL (ref 6–20)
CHLORIDE: 93 mmol/L — AB (ref 101–111)
CO2: 29 mmol/L (ref 22–32)
Calcium: 9.5 mg/dL (ref 8.9–10.3)
Creatinine, Ser: 1.21 mg/dL (ref 0.61–1.24)
GFR calc non Af Amer: >60 mL/min (ref >60)
Glucose, Bld: 869 mg/dL (ref 65–99)
Potassium: 5.6 mmol/L — ABNORMAL HIGH (ref 3.5–5.1)
SODIUM: 131 mmol/L — AB (ref 135–145)

## 2018-01-29 LAB — I-STAT TROPONIN, ED: TROPONIN I, POC: 0 ng/mL (ref 0.00–0.08)

## 2018-01-29 LAB — URINALYSIS, ROUTINE W REFLEX MICROSCOPIC
BACTERIA UA: NONE SEEN
BILIRUBIN URINE: NEGATIVE
Glucose, UA: >=500 mg/dL — AB
Hgb urine dipstick: NEGATIVE
KETONES UR: NEGATIVE mg/dL
LEUKOCYTES UA: NEGATIVE
NITRITE: NEGATIVE
PH: 5 (ref 5.0–8.0)
PROTEIN: NEGATIVE mg/dL
Specific Gravity, Urine: 1.031 — ABNORMAL HIGH (ref 1.005–1.030)

## 2018-01-29 LAB — CBC
HEMATOCRIT: 37.2 % — AB (ref 39.0–52.0)
HEMOGLOBIN: 12.5 g/dL — AB (ref 13.0–17.0)
MCH: 29.1 pg (ref 26.0–34.0)
MCHC: 33.6 g/dL (ref 30.0–36.0)
MCV: 86.7 fL (ref 78.0–100.0)
Platelets: 335 10*3/uL (ref 150–400)
RBC: 4.29 MIL/uL (ref 4.22–5.81)
RDW: 12.6 % (ref 11.5–15.5)
WBC: 6 10*3/uL (ref 4.0–10.5)

## 2018-01-29 LAB — CBG MONITORING, ED

## 2018-01-29 NOTE — ED Triage Notes (Signed)
Pt presents with increasing weakness for 2 months, emesis x 6 in last 24 hours. +abd pain, denies diarrhea.  Pt states he was on porch this evening began having dizziness, increased generalized weakness, worse with exertion.  CBG "high" in triage

## 2018-01-29 NOTE — ED Notes (Signed)
Checked CBG meter reads HI, RN Autumn informed

## 2018-01-30 ENCOUNTER — Other Ambulatory Visit: Payer: Self-pay

## 2018-01-30 ENCOUNTER — Encounter (HOSPITAL_COMMUNITY): Payer: Self-pay | Admitting: Internal Medicine

## 2018-01-30 ENCOUNTER — Observation Stay (HOSPITAL_COMMUNITY): Payer: Medicaid Other

## 2018-01-30 ENCOUNTER — Emergency Department (HOSPITAL_COMMUNITY): Payer: Medicaid Other

## 2018-01-30 DIAGNOSIS — R112 Nausea with vomiting, unspecified: Secondary | ICD-10-CM | POA: Diagnosis present

## 2018-01-30 DIAGNOSIS — R079 Chest pain, unspecified: Secondary | ICD-10-CM | POA: Diagnosis not present

## 2018-01-30 DIAGNOSIS — I714 Abdominal aortic aneurysm, without rupture, unspecified: Secondary | ICD-10-CM | POA: Diagnosis present

## 2018-01-30 DIAGNOSIS — E0800 Diabetes mellitus due to underlying condition with hyperosmolarity without nonketotic hyperglycemic-hyperosmolar coma (NKHHC): Secondary | ICD-10-CM | POA: Diagnosis present

## 2018-01-30 DIAGNOSIS — Z720 Tobacco use: Secondary | ICD-10-CM | POA: Diagnosis present

## 2018-01-30 DIAGNOSIS — E1100 Type 2 diabetes mellitus with hyperosmolarity without nonketotic hyperglycemic-hyperosmolar coma (NKHHC): Secondary | ICD-10-CM

## 2018-01-30 LAB — CBC WITH DIFFERENTIAL/PLATELET
BASOS PCT: 0 %
Basophils Absolute: 0 10*3/uL (ref 0.0–0.1)
Eosinophils Absolute: 0.2 10*3/uL (ref 0.0–0.7)
Eosinophils Relative: 3 %
HEMATOCRIT: 35.4 % — AB (ref 39.0–52.0)
HEMOGLOBIN: 11.9 g/dL — AB (ref 13.0–17.0)
LYMPHS ABS: 3.1 10*3/uL (ref 0.7–4.0)
Lymphocytes Relative: 45 %
MCH: 28.8 pg (ref 26.0–34.0)
MCHC: 33.6 g/dL (ref 30.0–36.0)
MCV: 85.7 fL (ref 78.0–100.0)
MONO ABS: 0.3 10*3/uL (ref 0.1–1.0)
MONOS PCT: 4 %
NEUTROS ABS: 3.3 10*3/uL (ref 1.7–7.7)
Neutrophils Relative %: 48 %
Platelets: 385 10*3/uL (ref 150–400)
RBC: 4.13 MIL/uL — ABNORMAL LOW (ref 4.22–5.81)
RDW: 12.5 % (ref 11.5–15.5)
WBC: 6.9 10*3/uL (ref 4.0–10.5)

## 2018-01-30 LAB — HIV ANTIBODY (ROUTINE TESTING W REFLEX): HIV Screen 4th Generation wRfx: NONREACTIVE

## 2018-01-30 LAB — IRON AND TIBC
IRON: 69 ug/dL (ref 45–182)
SATURATION RATIOS: 24 % (ref 17.9–39.5)
TIBC: 288 ug/dL (ref 250–450)
UIBC: 219 ug/dL

## 2018-01-30 LAB — CBC
HCT: 33.2 % — ABNORMAL LOW (ref 39.0–52.0)
HEMOGLOBIN: 11.3 g/dL — AB (ref 13.0–17.0)
MCH: 29.4 pg (ref 26.0–34.0)
MCHC: 34 g/dL (ref 30.0–36.0)
MCV: 86.2 fL (ref 78.0–100.0)
PLATELETS: 320 10*3/uL (ref 150–400)
RBC: 3.85 MIL/uL — ABNORMAL LOW (ref 4.22–5.81)
RDW: 12.6 % (ref 11.5–15.5)
WBC: 7.4 10*3/uL (ref 4.0–10.5)

## 2018-01-30 LAB — CBG MONITORING, ED
GLUCOSE-CAPILLARY: 75 mg/dL (ref 65–99)
GLUCOSE-CAPILLARY: 171 mg/dL — AB (ref 65–99)
GLUCOSE-CAPILLARY: 186 mg/dL — AB (ref 65–99)
GLUCOSE-CAPILLARY: 131 mg/dL — AB (ref 65–99)
GLUCOSE-CAPILLARY: 123 mg/dL — AB (ref 65–99)
GLUCOSE-CAPILLARY: 111 mg/dL — AB (ref 65–99)
Glucose-Capillary: 110 mg/dL — ABNORMAL HIGH (ref 65–99)
Glucose-Capillary: 149 mg/dL — ABNORMAL HIGH (ref 65–99)
Glucose-Capillary: 265 mg/dL — ABNORMAL HIGH (ref 65–99)
Glucose-Capillary: 458 mg/dL — ABNORMAL HIGH (ref 65–99)
Glucose-Capillary: 530 mg/dL (ref 65–99)

## 2018-01-30 LAB — I-STAT VENOUS BLOOD GAS, ED
ACID-BASE EXCESS: 5 mmol/L — AB (ref 0.0–2.0)
Bicarbonate: 31.4 mmol/L — ABNORMAL HIGH (ref 20.0–28.0)
O2 SAT: 37 %
TCO2: 33 mmol/L — AB (ref 22–32)
pCO2, Ven: 54 mmHg (ref 44.0–60.0)
pH, Ven: 7.373 (ref 7.250–7.430)
pO2, Ven: 23 mmHg — CL (ref 32.0–45.0)

## 2018-01-30 LAB — HEPATIC FUNCTION PANEL
ALT: 17 U/L (ref 17–63)
AST: 14 U/L — ABNORMAL LOW (ref 15–41)
Albumin: 3.3 g/dL — ABNORMAL LOW (ref 3.5–5.0)
Alkaline Phosphatase: 79 U/L (ref 38–126)
BILIRUBIN TOTAL: 0.4 mg/dL (ref 0.3–1.2)
Total Protein: 6.7 g/dL (ref 6.5–8.1)

## 2018-01-30 LAB — BASIC METABOLIC PANEL
ANION GAP: 8 (ref 5–15)
ANION GAP: 9 (ref 5–15)
BUN: 14 mg/dL (ref 6–20)
BUN: 15 mg/dL (ref 6–20)
CO2: 28 mmol/L (ref 22–32)
CO2: 27 mmol/L (ref 22–32)
Calcium: 9.5 mg/dL (ref 8.9–10.3)
Calcium: 8.9 mg/dL (ref 8.9–10.3)
Chloride: 111 mmol/L (ref 101–111)
Chloride: 106 mmol/L (ref 101–111)
Creatinine, Ser: 0.98 mg/dL (ref 0.61–1.24)
Creatinine, Ser: 0.98 mg/dL (ref 0.61–1.24)
GFR calc Af Amer: >60 mL/min (ref >60)
GFR calc Af Amer: >60 mL/min (ref >60)
GFR calc non Af Amer: >60 mL/min (ref >60)
Glucose, Bld: 143 mg/dL — ABNORMAL HIGH (ref 65–99)
Glucose, Bld: 138 mg/dL — ABNORMAL HIGH (ref 65–99)
POTASSIUM: 3.4 mmol/L — AB (ref 3.5–5.1)
Potassium: 3.6 mmol/L (ref 3.5–5.1)
SODIUM: 147 mmol/L — AB (ref 135–145)
SODIUM: 142 mmol/L (ref 135–145)

## 2018-01-30 LAB — TSH: TSH: 1.795 u[IU]/mL (ref 0.350–4.500)

## 2018-01-30 LAB — LIPASE, BLOOD: LIPASE: 518 U/L — AB (ref 11–51)

## 2018-01-30 LAB — RAPID URINE DRUG SCREEN, HOSP PERFORMED
AMPHETAMINES: NOT DETECTED
Barbiturates: NOT DETECTED
Benzodiazepines: NOT DETECTED
Cocaine: POSITIVE — AB
Opiates: POSITIVE — AB
Tetrahydrocannabinol: NOT DETECTED

## 2018-01-30 LAB — FERRITIN: Ferritin: 122 ng/mL (ref 24–336)

## 2018-01-30 LAB — BRAIN NATRIURETIC PEPTIDE: B NATRIURETIC PEPTIDE 5: 10.6 pg/mL (ref 0.0–100.0)

## 2018-01-30 LAB — D-DIMER, QUANTITATIVE: D-Dimer, Quant: 0.63 ug/mL-FEU — ABNORMAL HIGH (ref 0.00–0.50)

## 2018-01-30 LAB — BETA-HYDROXYBUTYRIC ACID: Beta-Hydroxybutyric Acid: 0.09 mmol/L (ref 0.05–0.27)

## 2018-01-30 LAB — TROPONIN I

## 2018-01-30 LAB — GLUCOSE, CAPILLARY
GLUCOSE-CAPILLARY: 370 mg/dL — AB (ref 65–99)
Glucose-Capillary: 357 mg/dL — ABNORMAL HIGH (ref 65–99)

## 2018-01-30 LAB — MAGNESIUM: MAGNESIUM: 2.3 mg/dL (ref 1.7–2.4)

## 2018-01-30 LAB — CK: Total CK: 89 U/L (ref 49–397)

## 2018-01-30 MED ORDER — HYDROCODONE-ACETAMINOPHEN 5-325 MG PO TABS
1.0000 | ORAL_TABLET | Freq: Four times a day (QID) | ORAL | Status: DC | PRN
  Administered 2018-01-30: 1 via ORAL
  Filled 2018-01-30: qty 1

## 2018-01-30 MED ORDER — NITROGLYCERIN 0.4 MG SL SUBL: 0.4000 mg | SUBLINGUAL_TABLET | SUBLINGUAL | Status: DC | PRN

## 2018-01-30 MED ORDER — SODIUM CHLORIDE 0.9 % IV SOLN
INTRAVENOUS | Status: DC
  Administered 2018-01-30: 03:00:00 via INTRAVENOUS

## 2018-01-30 MED ORDER — IOPAMIDOL (ISOVUE-370) INJECTION 76%
INTRAVENOUS | Status: AC
  Filled 2018-01-30: qty 100

## 2018-01-30 MED ORDER — HYDROCHLOROTHIAZIDE 12.5 MG PO CAPS
12.5000 mg | ORAL_CAPSULE | Freq: Every day | ORAL | Status: DC
  Administered 2018-01-30 – 2018-01-31 (×2): 12.5 mg via ORAL
  Filled 2018-01-30 (×2): qty 1

## 2018-01-30 MED ORDER — SODIUM CHLORIDE 0.9 % IV SOLN
INTRAVENOUS | Status: DC
  Administered 2018-01-30: 4.7 [IU]/h via INTRAVENOUS
  Filled 2018-01-30: qty 1

## 2018-01-30 MED ORDER — IOPAMIDOL (ISOVUE-370) INJECTION 76%
100.0000 mL | Freq: Once | INTRAVENOUS | Status: AC | PRN
  Administered 2018-01-30: 100 mL via INTRAVENOUS

## 2018-01-30 MED ORDER — ENOXAPARIN SODIUM 40 MG/0.4ML ~~LOC~~ SOLN
40.0000 mg | Freq: Every day | SUBCUTANEOUS | Status: DC
  Filled 2018-01-30: qty 0.4

## 2018-01-30 MED ORDER — INSULIN DETEMIR 100 UNIT/ML ~~LOC~~ SOLN
20.0000 [IU] | Freq: Every day | SUBCUTANEOUS | Status: DC
  Administered 2018-01-31: 20 [IU] via SUBCUTANEOUS
  Filled 2018-01-30 (×2): qty 0.2

## 2018-01-30 MED ORDER — SODIUM CHLORIDE 0.9 % IV SOLN
1000.0000 mL | INTRAVENOUS | Status: DC
  Administered 2018-01-30: 1000 mL via INTRAVENOUS

## 2018-01-30 MED ORDER — AMLODIPINE BESYLATE 10 MG PO TABS
10.0000 mg | ORAL_TABLET | Freq: Every day | ORAL | Status: DC
  Administered 2018-01-30 – 2018-01-31 (×2): 10 mg via ORAL
  Filled 2018-01-30: qty 1
  Filled 2018-01-30: qty 2

## 2018-01-30 MED ORDER — FAMOTIDINE IN NACL 20-0.9 MG/50ML-% IV SOLN
20.0000 mg | Freq: Two times a day (BID) | INTRAVENOUS | Status: DC
  Administered 2018-01-30 – 2018-01-31 (×3): 20 mg via INTRAVENOUS
  Filled 2018-01-30 (×3): qty 50

## 2018-01-30 MED ORDER — INSULIN DETEMIR 100 UNIT/ML ~~LOC~~ SOLN: 20.0000 [IU] | Freq: Two times a day (BID) | SUBCUTANEOUS | Status: DC

## 2018-01-30 MED ORDER — DICYCLOMINE HCL 20 MG PO TABS
20.0000 mg | ORAL_TABLET | Freq: Two times a day (BID) | ORAL | Status: DC
  Administered 2018-01-30 – 2018-01-31 (×4): 20 mg via ORAL
  Filled 2018-01-30 (×4): qty 1

## 2018-01-30 MED ORDER — INSULIN ASPART 100 UNIT/ML ~~LOC~~ SOLN
0.0000 [IU] | Freq: Every day | SUBCUTANEOUS | Status: DC
  Administered 2018-01-30: 5 [IU] via SUBCUTANEOUS

## 2018-01-30 MED ORDER — DEXTROSE-NACL 5-0.45 % IV SOLN
INTRAVENOUS | Status: DC
  Administered 2018-01-30: 05:00:00 via INTRAVENOUS

## 2018-01-30 MED ORDER — ATORVASTATIN CALCIUM 20 MG PO TABS
20.0000 mg | ORAL_TABLET | Freq: Every day | ORAL | Status: DC
  Administered 2018-01-30 – 2018-01-31 (×2): 20 mg via ORAL
  Filled 2018-01-30 (×2): qty 1

## 2018-01-30 MED ORDER — GABAPENTIN 300 MG PO CAPS
300.0000 mg | ORAL_CAPSULE | Freq: Three times a day (TID) | ORAL | Status: DC
  Administered 2018-01-30 – 2018-01-31 (×4): 300 mg via ORAL
  Filled 2018-01-30 (×4): qty 1

## 2018-01-30 MED ORDER — INSULIN ASPART 100 UNIT/ML ~~LOC~~ SOLN
0.0000 [IU] | Freq: Three times a day (TID) | SUBCUTANEOUS | Status: DC
  Administered 2018-01-30: 15 [IU] via SUBCUTANEOUS
  Administered 2018-01-31: 3 [IU] via SUBCUTANEOUS
  Administered 2018-01-31: 11 [IU] via SUBCUTANEOUS

## 2018-01-30 MED ORDER — MORPHINE SULFATE (PF) 4 MG/ML IV SOLN
4.0000 mg | Freq: Once | INTRAVENOUS | Status: AC
Start: 2018-01-30 — End: 2018-01-30
  Administered 2018-01-30: 4 mg via INTRAVENOUS
  Filled 2018-01-30: qty 1

## 2018-01-30 MED ORDER — SODIUM CHLORIDE 0.9 % IV BOLUS (SEPSIS)
1000.0000 mL | Freq: Once | INTRAVENOUS | Status: AC
  Administered 2018-01-30: 1000 mL via INTRAVENOUS

## 2018-01-30 MED ORDER — LABETALOL HCL 5 MG/ML IV SOLN: 5.0000 mg | INTRAVENOUS | Status: DC | PRN

## 2018-01-30 MED ORDER — CYCLOBENZAPRINE HCL 10 MG PO TABS: 10.0000 mg | ORAL_TABLET | Freq: Two times a day (BID) | ORAL | Status: DC | PRN

## 2018-01-30 MED ORDER — INSULIN DETEMIR 100 UNIT/ML ~~LOC~~ SOLN
30.0000 [IU] | Freq: Every day | SUBCUTANEOUS | Status: DC
  Administered 2018-01-30: 30 [IU] via SUBCUTANEOUS
  Filled 2018-01-30 (×2): qty 0.3

## 2018-01-30 MED ORDER — DEXTROSE-NACL 5-0.45 % IV SOLN: INTRAVENOUS | Status: DC

## 2018-01-30 MED ORDER — SODIUM CHLORIDE 0.9 % IV SOLN: INTRAVENOUS | Status: DC

## 2018-01-30 NOTE — Progress Notes (Signed)
This is a no charge note, for furhter details, please see H&P from earlier today by my partner Dr. Hal Hope.  Patient seen and examined.  Appears tired, but comfortable.  Respirations normal.  Cor RRR, no LE edema.  No epigastric pain or any pain with abdominal exam.  Soft abdomen.  No HSM.    Ryan Medina is a 57 y.o. M with CAD, isch CM EF 45%, polysubstance abuse, IDDM, and HTN who presents with abdominal pain, vomiting, malaise, and chest pain.  In ER, found to be in HHS, no acidosis.  Troponins normal and ECG without ischemic changes.  CTA abdomen showed no dissection, mild enteritis, no pancreatitis.  Placed on insulin infusion, IV fluids, admitted.     HHS Cocaine and opiates in urine. -Continue insulin gtt -IV fluids -BMP q4hrs for now -Follow A1c  Vomiting -Continue PPI  Chest pain Troponins negative.  Dimer elevated. -Follow Echo -Obtain CTA chest  HTN -Continue amlodipine -Continue HCTZ  Aneurysm Stable  Smoking  Anemia -Check iron studies

## 2018-01-30 NOTE — ED Notes (Signed)
Pt. To CT via stretcher. 

## 2018-01-30 NOTE — H&P (Signed)
History and Physical    Don Tiu Plair XTG:[id number] DOB: 05-15-1961 DOA: 01/29/2018  PCP: Dorena Dew, FNP  Patient coming from: Home.  Chief Complaint: Fatigue abdominal pain nausea vomiting.  HPI: Ryan Medina is a 57 y.o. male with history of diabetes mellitus type 2, hypertension, polysubstance abuse, dilated cardiomyopathy with normal cardiac cath in 2017 presents to the ER with  multiple complaints primarily concerning for fatigue and abdominal pain.  Patient states over the last 3 months patient has been having worsening abdominal pain and nausea vomiting each time he eats.  Has been losing weight about 70 pounds last few months.  Over the last 24 hours patient's abdominal pain became more intense generalized persistent vomiting with no diarrhea.  Patient also occasionally has been having chest pain with shortness of breath.  Not able to exactly characterize the chest pain.  Denies any blood in the vomitus or denies any productive cough fever or chills.  Patient states he has been taking his medications but happens to throw up when he takes it.  ED Course: In the ER patient is found to have a blood sugar of 869 but not in DKA.  CT abdomen and pelvis done shows features concerning for enteritis.  Has known history of abdominal articulation which appears to be stable.  Patient was started on insulin infusion for hyperosmolar status.  Admitted for further management of hyperosmolar status, abdominal pain chest pain nausea vomiting.  Review of Systems: As per HPI, rest all negative.   Past Medical History:  Diagnosis Date  . Back pain, chronic   . Diabetes ([place])   . Dilated cardiomyopathy ([place])    Echo 2/18: EF 45-50, diff HK, Gr 1 DD, trivial AI/TR  . HLD (hyperlipidemia)   . Hypertension   . IDDM (insulin dependent diabetes mellitus) ([place])   . Ruptured lumbar disc     Past Surgical History:  Procedure Laterality Date  . ABDOMINAL SURGERY    . APPENDECTOMY    . BACK  SURGERY    . CARDIAC CATHETERIZATION N/A 05/19/2016   Procedure: Left Heart Cath and Coronary Angiography;  Surgeon: Leonie Man, MD;  Location: [facility];  Service: Cardiovascular;  Laterality: N/A;     reports that he has been smoking cigarettes.  He has a 6.25 pack-year smoking history. He has never used smokeless tobacco. He reports that he does not drink alcohol or use drugs.  Allergies  Allergen Reactions  . Lisinopril Anaphylaxis    Family History  Problem Relation Age of Onset  . Diabetes Mother   . Heart failure Mother   . Diabetes Sister   . Diabetes Brother     Prior to Admission medications   Medication Sig Start Date End Date Taking? Authorizing Provider  albuterol (PROVENTIL HFA;VENTOLIN HFA) 108 (90 Base) MCG/ACT inhaler Inhale 1-2 puffs into the lungs every 6 (six) hours as needed for wheezing or shortness of breath.   Yes [provider]  amLODipine (NORVASC) 10 MG tablet TAKE 1 TABLET BY MOUTH EVERY DAY Patient taking differently: TAKE 1 TABLET (10 MG TOTALLY) BY MOUTH EVERY DAY 01/29/17  Yes Dorena Dew, FNP  ASPIR-LOW 81 MG EC tablet Take 81 mg by mouth daily. 01/29/17  Yes [provider]  atorvastatin (LIPITOR) 20 MG tablet Take 1 tablet (20 mg total) by mouth daily. 11/07/16  Yes Dorena Dew, FNP  cyclobenzaprine (FLEXERIL) 10 MG tablet Take 1 tablet (10 mg total) by mouth 2 (  two) times daily as needed for muscle spasms. 12/07/16  Yes Kirichenko, Tatyana, PA-C  dicyclomine (BENTYL) 20 MG tablet Take 1 tablet (20 mg total) by mouth 2 (two) times daily. 11/16/17  Yes Valere Dross, Alyssa B, PA-C  gabapentin (NEURONTIN) 300 MG capsule Take 1 capsule (300 mg total) by mouth 3 (three) times daily. 12/05/16  Yes Dorena Dew, FNP  glucose blood (AGAMATRIX PRESTO TEST) test strip Check blood sugars prior to meals and at bedtime 11/07/16  Yes Dorena Dew, FNP  hydrochlorothiazide (MICROZIDE) 12.5 MG capsule Take 1 capsule (12.5 mg  total) by mouth daily. 11/12/16 01/30/18 Yes Weaver, Scott T, PA-C  HYDROcodone-acetaminophen (NORCO/VICODIN) 5-325 MG tablet Take 1 tablet by mouth every 6 (six) hours as needed. Patient taking differently: Take 1 tablet by mouth every 6 (six) hours as needed.  03/17/17  Yes Drenda Freeze, MD  insulin detemir (LEVEMIR) 100 UNIT/ML injection INJECT 30  UNITS in the morning and 20 UNITS at bedtime Patient taking differently: Inject 20-30 Units into the skin 2 (two) times daily. 20 units in the morning and 30 units at night. 12/05/16  Yes Dorena Dew, FNP  meloxicam (MOBIC) 15 MG tablet TAKE 1 TABLET BY MOUTH EVERY DAY Patient taking differently: TAKE 1 TABLET (15 MG TOTALLY) BY MOUTH EVERY DAY 01/29/17  Yes Dorena Dew, FNP  metFORMIN (GLUCOPHAGE) 1000 MG tablet TAKE 1 TABLET BY MOUTH 2 TIMES DAILY WITH A MEAL 01/29/17  Yes Hollis, Asencion Partridge, FNP  NITROSTAT 0.4 MG SL tablet DISSOLVE ONE TABLET UNDER THE TONGUE EVERY 5 MINUTES AS NEEDED FOR CHEST PAIN UP TO 3 DOSES 01/29/17  Yes Dorena Dew, FNP  ondansetron (ZOFRAN ODT) 4 MG disintegrating tablet Take 1 tablet (4 mg total) by mouth every 8 (eight) hours as needed for nausea or vomiting. 12/19/17  Yes Gareth Morgan, MD    Physical Exam: Vitals:   01/29/18 2214 01/29/18 2225 01/29/18 2230 01/29/18 2300  BP: 106/82   110/88  Pulse: (!) 101   96  Resp: 18   17  Temp: 98.3 F (36.8 C)     TempSrc: Oral     SpO2: 100%   100%  Weight:   58.7 kg (129 lb 5 oz)   Height:  5\' 11"  (1.803 m)        Constitutional: Moderately built and nourished. Vitals:   01/29/18 2214 01/29/18 2225 01/29/18 2230 01/29/18 2300  BP: 106/82   110/88  Pulse: (!) 101   96  Resp: 18   17  Temp: 98.3 F (36.8 C)     TempSrc: Oral     SpO2: 100%   100%  Weight:   58.7 kg (129 lb 5 oz)   Height:  5\' 11"  (1.803 m)     Eyes: Anicteric no pallor. ENMT: No discharge from the ears eyes nose or mouth. Neck: No mass felt.  No neck  rigidity. Respiratory: No rhonchi or crepitations. Cardiovascular: S1-S2 heard no murmurs appreciated. Abdomen: Soft nontender bowel sounds present. Musculoskeletal: No edema.  No joint effusion. Skin: No rash.  Skin appears warm. Neurologic: Alert awake oriented to time place and person.  Moves all extremities. Psychiatric: Appears normal.  Normal affect.   Labs on Admission: I have personally reviewed following labs and imaging studies  CBC: Recent Labs  Lab 01/29/18 2240  WBC 6.0  HGB 12.5*  HCT 37.2*  MCV 86.7  PLT 623   Basic Metabolic Panel: Recent Labs  Lab 01/29/18 2240  NA 131*  K 5.6*  CL 93*  CO2 29  GLUCOSE 869*  BUN 17  CREATININE 1.21  CALCIUM 9.5   GFR: Estimated Creatinine Clearance: 55.9 mL/min (by C-G formula based on SCr of 1.21 mg/dL). Liver Function Tests: No results for input(s): AST, ALT, ALKPHOS, BILITOT, PROT, ALBUMIN in the last 168 hours. No results for input(s): LIPASE, AMYLASE in the last 168 hours. No results for input(s): AMMONIA in the last 168 hours. Coagulation Profile: No results for input(s): INR, PROTIME in the last 168 hours. Cardiac Enzymes: No results for input(s): CKTOTAL, CKMB, CKMBINDEX, TROPONINI in the last 168 hours. BNP (last 3 results) No results for input(s): PROBNP in the last 8760 hours. HbA1C: No results for input(s): HGBA1C in the last 72 hours. CBG: Recent Labs  Lab 01/29/18 2224 01/30/18 0056 01/30/18 0224  GLUCAP >600* 530* 458*   Lipid Profile: No results for input(s): CHOL, HDL, LDLCALC, TRIG, CHOLHDL, LDLDIRECT in the last 72 hours. Thyroid Function Tests: No results for input(s): TSH, T4TOTAL, FREET4, T3FREE, THYROIDAB in the last 72 hours. Anemia Panel: No results for input(s): VITAMINB12, FOLATE, FERRITIN, TIBC, IRON, RETICCTPCT in the last 72 hours. Urine analysis:    Component Value Date/Time   COLORURINE STRAW (A) 01/29/2018 2238   APPEARANCEUR CLEAR 01/29/2018 2238   LABSPEC 1.031 (H)  01/29/2018 2238   PHURINE 5.0 01/29/2018 2238   GLUCOSEU >=500 (A) 01/29/2018 2238   HGBUR NEGATIVE 01/29/2018 2238   [place] 01/29/2018 2238   [place] 01/29/2018 2238   PROTEINUR NEGATIVE 01/29/2018 2238   UROBILINOGEN 4.0 (H) 12/05/2016 0921   NITRITE NEGATIVE 01/29/2018 2238   LEUKOCYTESUR NEGATIVE 01/29/2018 2238   Sepsis Labs: @LABRCNTIP (procalcitonin:4,lacticidven:4) )No results found for this or any previous visit (from the past 240 hour(s)).   Radiological Exams on Admission: Dg Chest 2 View  Result Date: 01/30/2018 CLINICAL DATA:  57 y/o M; 2 months of increased weakness. Abdominal pain and emesis. EXAM: CHEST - 2 VIEW COMPARISON:  11/12/2017 chest radiograph FINDINGS: Stable heart size and mediastinal contours are within normal limits. Both lungs are clear. The visualized skeletal structures are unremarkable. IMPRESSION: No acute pulmonary process identified. Electronically Signed   By: Kristine Garbe M.D.   On: 01/30/2018 00:11   Ct Angio Abdomen W And/or Wo Contrast  Result Date: 01/30/2018 CLINICAL DATA:  57 y/o M; 2 months of increasing weakness. Six episodes of emesis in the last 24 hours. Abdominal pain. EXAM: CT ANGIOGRAPHY ABDOMEN TECHNIQUE: Multidetector CT imaging of the abdomen was performed using the standard protocol during bolus administration of intravenous contrast. Multiplanar reconstructed images and MIPs were obtained and reviewed to evaluate the vascular anatomy. CONTRAST:  129mL ISOVUE-370 IOPAMIDOL (ISOVUE-370) INJECTION 76% COMPARISON:  12/19/2017 chest radiograph. FINDINGS: VASCULAR Aorta: 3 cm aneurysm near the aortic bifurcation. Celiac: Patent without evidence of aneurysm, dissection, vasculitis or significant stenosis. SMA: Patent without evidence of aneurysm, dissection, vasculitis or significant stenosis. Renals: Both renal arteries are patent without evidence of aneurysm, dissection, vasculitis, fibromuscular dysplasia  or significant stenosis. IMA: Patent without evidence of aneurysm, dissection, vasculitis or significant stenosis. Inflow: 2.9 cm right common iliac artery and 2.6 cm left common iliac arteries are stable. Poor contrast opacification extending into the inflow due to contrast bolus timing. Veins: No enhancement of the venous system. Review of the MIP images confirms the above findings. NON-VASCULAR Lower chest: Stable 2 mm right middle lobe nodule. Hepatobiliary: No focal liver abnormality is seen. No gallstones, gallbladder wall thickening, or biliary dilatation.  Pancreas: Unremarkable. No pancreatic ductal dilatation or surrounding inflammatory changes. Spleen: Normal in size without focal abnormality. Adrenals/Urinary Tract: Adrenal glands are unremarkable. Kidneys are normal, without renal calculi, focal lesion, or hydronephrosis. Bladder is unremarkable. Stomach/Bowel: No obstructive changes of bowel. Mild wall thickening of bowel loops in the mid abdomen. Appendix not identified, no pericecal inflammation. Moderate volume of stool in the colon. Lymphatic: No enlarged abdominal or pelvic lymph nodes. Other: No abdominal wall hernia or abnormality. No abdominopelvic ascites. Musculoskeletal: Lumbar spondylosis greatest at the L5-S1 level with there is severe disc space narrowing and grade 1 retrolisthesis. IMPRESSION: VASCULAR 1. No vessel occlusion identified. 2. 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 Aorta at bifurcation measures up to 3 cm. Recommend followup by ultrasound in 3 years. This recommendation follows ACR consensus guidelines: White Paper of the ACR Incidental Findings Committee II on Vascular Findings. J Am Coll Radiol 2013; 10:789-794 NON-VASCULAR 1. Mild wall thickening of small bowel loops in mid abdomen may represent enteritis. 2. Advanced lumbar spondylosis with severe discogenic degenerative changes at L5-S1. Electronically Signed   By: Kristine Garbe M.D.    On: 01/30/2018 01:41    EKG: Independently reviewed.  Sinus tachycardia with biatrial enlargement.  Assessment/Plan Principal Problem:   Hyperosmolar non-ketotic state in patient with type 2 diabetes mellitus ([place]) Active Problems:   Essential hypertension   Dilated cardiomyopathy ([facility])   Chest pain   AAA (abdominal aortic aneurysm) ([facility])   Tobacco abuse   Nausea & vomiting    1. Hyperosmolar status nonketotic state diabetes mellitus type 2 -patient states he has been persistently vomiting.  Could have precipitated the state.  Not sure if patient has been compliant with his medications.  Will check hemoglobin A1c.  We will continue to hydrate and keep patient on insulin infusion until patient can reliably take orally.  Follow metabolic panel. 2. Nausea vomiting with abdominal pain and weight loss -CT of the abdomen pelvis does show some enteritis features.  Will check LFTs lipase.  Cycle cardiac markers.  Since patient has weight loss and eventually will need colonoscopy.  Will keep patient on Protonix since patient also takes NSAIDs.  Denies any blood in the vomitus. 3. Chest pain with shortness of breath -patient at this time is not in distress.  We will cycle cardiac markers.  Patient had a normal cardiac cath in 2017 which was unremarkable.  Subsequent 2D echo done in March 2018 was showing EF of 45 to 50% with grade 1 diastolic dysfunction.  Patient is not on diuretics.  Closely follow respiratory status will check d-dimer and BNP. 4. Hypertension on amlodipine.  Would hold hydrochlorothiazide since patient is receiving fluids.  Follow blood pressure trends. 5. Abdominal aortic aneurysm.  CAT scan shows stable aneurysm.  Abdominal pain does not seem to be from the aneurysm and likely from other causes.  LFTs and lipase are pending. 6. Tobacco abuse -advised to quit smoking.  Patient denies drinking alcohol.  Urine drug screen is pending. 7. Anemia normocytic normochromic -hemoglobin is  mildly lower than his previous.  There is one reading which was more than now as it was last one.  Otherwise hemoglobin appears to be at baseline mildly low will follow CBC check anemia panel.   DVT prophylaxis: Lovenox. Code Status: Full code. Family Communication: Discussed with patient. Disposition Plan: Patient lives in [place]. Consults called: Social work. Admission status: Observation.   [name] MD [facility] Pager [phone].  If 7PM-7AM, please contact night-coverage [email or web address] Password [email or web address]  01/30/2018, 3:09 AM

## 2018-01-30 NOTE — ED Provider Notes (Signed)
[facility] EMERGENCY DEPARTMENT Provider Note   CSN: [id number] Arrival date & time: 01/29/18  2126     History   Chief Complaint Chief Complaint  Patient presents with  . Weakness    HPI Ryan Medina is a 57 y.o. male.  HPI 57 year old African-American male past medical history significant for cardiomyopathy, hypertension, hyperlipidemia, diabetes, AAA that presents to the emergency department today for evaluation of weakness over the past 2 months.  Also reports generalized abdominal pain, nausea and vomiting over the last 24 hours.  Patient reports a 70 pound weight loss over the past 3 months.  He also reports some chest pain and exertional shortness of breath that has been worsening for the past 2 to 3 weeks.  Patient denies any associated fevers.  Denies any associated change in his bowel habits.  Patient reports that his blood sugars are not controlled at home.  States that his primary care doctor keeps changing his insulin however his blood sugars are not controlled.  States that his blood sugars have been running high for the past month.  Patient has been seen several times in the ED of the past few months for similar symptoms.  Patient states that the chest pain is substernal does not radiate.  Reports some mild shortness of breath with exertion.  Denies any associated diaphoresis.  Patient has no cardiac history.  Denies any history of PE/DVT, prolonged immobilization, recent hospitalization/surgeries, unilateral leg swelling or calf tenderness, hemoptysis, tobacco use or current cancer diagnosis.  Patient has not been taken anything for his symptoms prior to arrival.  Nothing makes better.  Denies any associated urinary symptoms.  Reports associated intermittent lightheadedness and dizziness without any syncope.  Patient does report urinary urgency and frequency but denies any hematuria or dysuria.   Pt denies any fever, chill, ha, vision changes, congestion,  neck pain,cough, change in bowel habits, melena, hematochezia, lower extremity paresthesias.   Past Medical History:  Diagnosis Date  . Back pain, chronic   . Diabetes ([place])   . Dilated cardiomyopathy ([place])    Echo 2/18: EF 45-50, diff HK, Gr 1 DD, trivial AI/TR  . HLD (hyperlipidemia)   . Hypertension   . IDDM (insulin dependent diabetes mellitus) ([place])   . Ruptured lumbar disc     Patient Active Problem List   Diagnosis Date Noted  . Dilated cardiomyopathy ([place])   . Other hyperlipidemia 11/11/2016  . Abnormal nuclear stress test 05/19/2016  . Atypical angina ([place]) 04/25/2016  . DM (diabetes mellitus), type 2, uncontrolled ([place]) 03/14/2015  . Essential hypertension 03/14/2015  . Hyperglycemia 01/14/2015    Past Surgical History:  Procedure Laterality Date  . ABDOMINAL SURGERY    . APPENDECTOMY    . BACK SURGERY    . CARDIAC CATHETERIZATION N/A 05/19/2016   Procedure: Left Heart Cath and Coronary Angiography;  Surgeon: Leonie Man, MD;  Location: [facility];  Service: Cardiovascular;  Laterality: N/A;        Home Medications    Prior to Admission medications   Medication Sig Start Date End Date Taking? Authorizing Provider  albuterol (PROVENTIL HFA;VENTOLIN HFA) 108 (90 Base) MCG/ACT inhaler Inhale 1-2 puffs into the lungs every 6 (six) hours as needed for wheezing or shortness of breath.    [provider]  amLODipine (NORVASC) 10 MG tablet TAKE 1 TABLET BY MOUTH EVERY DAY Patient taking differently: TAKE 1 TABLET (10 MG TOTALLY) BY MOUTH EVERY DAY 01/29/17   Smith Robert,  Asencion Partridge, FNP  ASPIR-LOW 81 MG EC tablet Take 81 mg by mouth daily. 01/29/17   [provider]  aspirin EC 325 MG tablet Take 1 tablet (325 mg total) by mouth daily. Patient not taking: Reported on 11/12/2017 12/05/16   Khalsa, Delice Bison, DO  atorvastatin (LIPITOR) 20 MG tablet Take 1 tablet (20 mg total) by mouth daily. Patient not taking: Reported on 04/29/2017 11/07/16   Dorena Dew, FNP  cyclobenzaprine (FLEXERIL) 10 MG tablet Take 1 tablet (10 mg total) by mouth 2 (two) times daily as needed for muscle spasms. Patient not taking: Reported on 04/29/2017 12/07/16   Jeannett Senior, PA-C  dicyclomine (BENTYL) 20 MG tablet Take 1 tablet (20 mg total) by mouth 2 (two) times daily. 11/16/17   Langston Masker B, PA-C  gabapentin (NEURONTIN) 300 MG capsule Take 1 capsule (300 mg total) by mouth 3 (three) times daily. 12/05/16   Dorena Dew, FNP  glucose blood (AGAMATRIX PRESTO TEST) test strip Check blood sugars prior to meals and at bedtime 11/07/16   Dorena Dew, FNP  hydrochlorothiazide (MICROZIDE) 12.5 MG capsule Take 1 capsule (12.5 mg total) by mouth daily. 11/12/16 12/19/17  Richardson Dopp T, PA-C  HYDROcodone-acetaminophen (NORCO/VICODIN) 5-325 MG tablet Take 1 tablet by mouth every 6 (six) hours as needed. Patient not taking: Reported on 10/01/2017 03/17/17   Drenda Freeze, MD  insulin detemir (LEVEMIR) 100 UNIT/ML injection INJECT 30  UNITS in the morning and 20 UNITS at bedtime Patient taking differently: Inject 20 Units into the skin every morning.  12/05/16   Dorena Dew, FNP  isosorbide mononitrate (IMDUR) 30 MG 24 hr tablet Take 1 tablet (30 mg total) by mouth daily. Patient not taking: Reported on 11/12/2017 11/12/16 10/01/17  Richardson Dopp T, PA-C  meloxicam (MOBIC) 15 MG tablet TAKE 1 TABLET BY MOUTH EVERY DAY Patient taking differently: TAKE 1 TABLET (15 MG TOTALLY) BY MOUTH EVERY DAY 01/29/17   Dorena Dew, FNP  metFORMIN (GLUCOPHAGE) 1000 MG tablet TAKE 1 TABLET BY MOUTH 2 TIMES DAILY WITH A MEAL Patient taking differently: TAKE 1 TABLET (1000 MG TOTALLY) BY MOUTH 2 TIMES DAILY WITH A MEAL 01/29/17   Hollis, Asencion Partridge, FNP  NITROSTAT 0.4 MG SL tablet DISSOLVE ONE TABLET UNDER THE TONGUE EVERY 5 MINUTES AS NEEDED FOR CHEST PAIN UP TO 3 DOSES 01/29/17   Dorena Dew, FNP  ondansetron (ZOFRAN ODT) 4 MG disintegrating tablet Take 1  tablet (4 mg total) by mouth every 8 (eight) hours as needed for nausea or vomiting. 12/19/17   Gareth Morgan, MD  ondansetron (ZOFRAN ODT) 8 MG disintegrating tablet Take 1 tablet (8 mg total) by mouth every 8 (eight) hours as needed for nausea or vomiting. Patient not taking: Reported on 04/29/2017 11/27/16   Jeannett Senior, PA-C  ondansetron (ZOFRAN) 4 MG tablet Take 1 tablet (4 mg total) by mouth every 6 (six) hours. Patient not taking: Reported on 04/29/2017 11/29/16   Virgel Manifold, MD  pantoprazole (PROTONIX) 40 MG tablet Take 1 tablet (40 mg total) by mouth daily. Patient not taking: Reported on 12/19/2017 11/16/17   Langston Masker B, PA-C  sucralfate (CARAFATE) 1 g tablet Take 1 tablet (1 g total) by mouth 4 (four) times daily -  with meals and at bedtime for 7 days. Patient not taking: Reported on 12/19/2017 11/16/17 11/23/17  Albesa Seen, PA-C    Family History Family History  Problem Relation Age of Onset  . Diabetes Mother   .  Heart failure Mother   . Diabetes Sister   . Diabetes Brother     Social History Social History   Tobacco Use  . Smoking status: Current Every Day Smoker    Packs/day: 0.25    Years: 25.00    Pack years: 6.25    Types: Cigarettes  . Smokeless tobacco: Never Used  . Tobacco comment: 2 per day   Substance Use Topics  . Alcohol use: No  . Drug use: No     Allergies   Lisinopril   Review of Systems Review of Systems  All other systems reviewed and are negative.    Physical Exam Updated Vital Signs BP 110/88   Pulse 96   Temp 98.3 F (36.8 C) (Oral)   Resp 17   Ht 5\' 11"  (1.803 m)   Wt 58.7 kg (129 lb 5 oz) Comment: Pt weighed in triage  SpO2 100%   BMI 18.04 kg/m   Physical Exam  Constitutional: He is oriented to person, place, and time. He appears well-developed and well-nourished.  Non-toxic appearance. No distress.  Patient is very thin appearing.  HENT:  Head: Normocephalic and atraumatic.  Nose: Nose normal.    Mouth/Throat: Oropharynx is clear and moist.  Mucous membranes are dry.  Eyes: Pupils are equal, round, and reactive to light. Conjunctivae are normal. Right eye exhibits no discharge. Left eye exhibits no discharge.  Neck: Normal range of motion. Neck supple. No JVD present. No tracheal deviation present.  Cardiovascular: Normal rate, regular rhythm, normal heart sounds and intact distal pulses. Exam reveals no gallop and no friction rub.  No murmur heard. Pulmonary/Chest: Effort normal and breath sounds normal. No stridor. No respiratory distress. He has no wheezes. He has no rales. He exhibits no tenderness.  No hypoxia or tachypnea.  Abdominal: Soft. Normal appearance and bowel sounds are normal. He exhibits no distension. There is generalized tenderness. There is no rigidity, no rebound, no guarding, no CVA tenderness, no tenderness at McBurney's point and negative Murphy's sign.  Musculoskeletal: Normal range of motion. He exhibits no tenderness.  No lower extremity edema or calf tenderness.  Lymphadenopathy:    He has no cervical adenopathy.  Neurological: He is alert and oriented to person, place, and time.  Skin: Skin is warm and dry. Capillary refill takes less than 2 seconds. No rash noted. He is not diaphoretic.  Poor skin turgor with dry skin noted.  Psychiatric: His behavior is normal. Judgment and thought content normal.  Nursing note and vitals reviewed.    ED Treatments / Results  Labs (all labs ordered are listed, but only abnormal results are displayed) Labs Reviewed  BASIC METABOLIC PANEL - Abnormal; Notable for the following components:      Result Value   Sodium 131 (*)    Potassium 5.6 (*)    Chloride 93 (*)    Glucose, Bld 869 (*)    All other components within normal limits  CBC - Abnormal; Notable for the following components:   Hemoglobin 12.5 (*)    HCT 37.2 (*)    All other components within normal limits  URINALYSIS, ROUTINE W REFLEX MICROSCOPIC -  Abnormal; Notable for the following components:   Color, Urine STRAW (*)    Specific Gravity, Urine 1.031 (*)    Glucose, UA >=500 (*)    All other components within normal limits  CBG MONITORING, ED - Abnormal; Notable for the following components:   Glucose-Capillary >600 (*)    All other components  within normal limits  I-STAT VENOUS BLOOD GAS, ED - Abnormal; Notable for the following components:   pO2, Ven 23.0 (*)    Bicarbonate 31.4 (*)    TCO2 33 (*)    Acid-Base Excess 5.0 (*)    All other components within normal limits  CBG MONITORING, ED - Abnormal; Notable for the following components:   Glucose-Capillary 530 (*)    All other components within normal limits  BLOOD GAS, VENOUS  I-STAT TROPONIN, ED  I-STAT TROPONIN, ED    EKG EKG Interpretation  Date/Time:  Saturday Jan 30 2018 00:25:13 EDT Ventricular Rate:  99 PR Interval:  142 QRS Duration: 83 QT Interval:  332 QTC Calculation: 426 R Axis:   85 Text Interpretation:  Sinus tachycardia Ventricular premature complex Biatrial enlargement Nonspecific T abnormalities, lateral leads No significant change was found Confirmed by Jola Schmidt [phone]) on 01/30/2018 12:30:10 AM   Radiology Dg Chest 2 View  Result Date: 01/30/2018 CLINICAL DATA:  57 y/o M; 2 months of increased weakness. Abdominal pain and emesis. EXAM: CHEST - 2 VIEW COMPARISON:  11/12/2017 chest radiograph FINDINGS: Stable heart size and mediastinal contours are within normal limits. Both lungs are clear. The visualized skeletal structures are unremarkable. IMPRESSION: No acute pulmonary process identified. Electronically Signed   By: Kristine Garbe M.D.   On: 01/30/2018 00:11   Ct Angio Abdomen W And/or Wo Contrast  Result Date: 01/30/2018 CLINICAL DATA:  57 y/o M; 2 months of increasing weakness. Six episodes of emesis in the last 24 hours. Abdominal pain. EXAM: CT ANGIOGRAPHY ABDOMEN TECHNIQUE: Multidetector CT imaging of the abdomen was  performed using the standard protocol during bolus administration of intravenous contrast. Multiplanar reconstructed images and MIPs were obtained and reviewed to evaluate the vascular anatomy. CONTRAST:  15mL ISOVUE-370 IOPAMIDOL (ISOVUE-370) INJECTION 76% COMPARISON:  12/19/2017 chest radiograph. FINDINGS: VASCULAR Aorta: 3 cm aneurysm near the aortic bifurcation. Celiac: Patent without evidence of aneurysm, dissection, vasculitis or significant stenosis. SMA: Patent without evidence of aneurysm, dissection, vasculitis or significant stenosis. Renals: Both renal arteries are patent without evidence of aneurysm, dissection, vasculitis, fibromuscular dysplasia or significant stenosis. IMA: Patent without evidence of aneurysm, dissection, vasculitis or significant stenosis. Inflow: 2.9 cm right common iliac artery and 2.6 cm left common iliac arteries are stable. Poor contrast opacification extending into the inflow due to contrast bolus timing. Veins: No enhancement of the venous system. Review of the MIP images confirms the above findings. NON-VASCULAR Lower chest: Stable 2 mm right middle lobe nodule. Hepatobiliary: No focal liver abnormality is seen. No gallstones, gallbladder wall thickening, or biliary dilatation. Pancreas: Unremarkable. No pancreatic ductal dilatation or surrounding inflammatory changes. Spleen: Normal in size without focal abnormality. Adrenals/Urinary Tract: Adrenal glands are unremarkable. Kidneys are normal, without renal calculi, focal lesion, or hydronephrosis. Bladder is unremarkable. Stomach/Bowel: No obstructive changes of bowel. Mild wall thickening of bowel loops in the mid abdomen. Appendix not identified, no pericecal inflammation. Moderate volume of stool in the colon. Lymphatic: No enlarged abdominal or pelvic lymph nodes. Other: No abdominal wall hernia or abnormality. No abdominopelvic ascites. Musculoskeletal: Lumbar spondylosis greatest at the L5-S1 level with there is  severe disc space narrowing and grade 1 retrolisthesis. IMPRESSION: VASCULAR 1. No vessel occlusion identified. 2. 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 Aorta at bifurcation measures up to 3 cm. Recommend followup by ultrasound in 3 years. This recommendation follows ACR consensus guidelines: White Paper of the ACR Incidental Findings Committee II on Vascular Findings.  J Am Coll Radiol 2013; 10:789-794 NON-VASCULAR 1. Mild wall thickening of small bowel loops in mid abdomen may represent enteritis. 2. Advanced lumbar spondylosis with severe discogenic degenerative changes at L5-S1. Electronically Signed   By: Kristine Garbe M.D.   On: 01/30/2018 01:41    Procedures Procedures (including critical care time)  Medications Ordered in ED Medications  sodium chloride 0.9 % bolus 1,000 mL (1,000 mLs Intravenous New Bag/Given 01/30/18 0058)    Followed by  0.9 %  sodium chloride infusion (1,000 mLs Intravenous New Bag/Given 01/30/18 0059)  dextrose 5 %-0.45 % sodium chloride infusion (has no administration in time range)  insulin regular (NOVOLIN R,HUMULIN R) 100 Units in sodium chloride 0.9 % 100 mL (1 Units/mL) infusion (4.7 Units/hr Intravenous New Bag/Given 01/30/18 0107)  iopamidol (ISOVUE-370) 76 % injection (has no administration in time range)  morphine 4 MG/ML injection 4 mg (4 mg Intravenous Given 01/30/18 0111)  iopamidol (ISOVUE-370) 76 % injection 100 mL (100 mLs Intravenous Contrast Given 01/30/18 0043)     Initial Impression / Assessment and Plan / ED Course  I have reviewed the triage vital signs and the nursing notes.  Pertinent labs & imaging results that were available during my care of the patient were reviewed by me and considered in my medical decision making (see chart for details).     57 year old gentleman presents to the ED for evaluation of ongoing weakness, high blood sugars, nausea, vomiting, generalized abdominal pain, chest pain and  exertional short of breath.  Initially patient was tachycardic.  He is afebrile with no hypotension or hypoxia noted.  Patient is thin appearing.  He does appear to be very dehydrated.  Generalized abdominal tenderness to palpation but no distention noted.  Patient has no peritoneal signs.  Bowel sounds present in all 4 quadrants.  Heart regular rate and rhythm on my examination.  Lungs clear to auscultation bilaterally.  Lab work shows a glucose of 869.  There is no leukocytosis noted.  Stable hemoglobin.  Mild hyperkalemia 5.6.  Sodium was 131 however when corrected was normal.  Normal kidney function and anion gap.  Patient is not acidotic.  Negative initial troponin.  UA shows no signs of infection or ketones.  Troponin was negative.  Chest x-ray showed no acute abnormalities as reviewed by myself.  EKG showed no signs of acute ischemia and appears similar to prior tracings.  Patient appears dehydrated with hyperglycemia with no signs of DKA.  I suspect hyperosmolar syndrome possibly.  Patient given fluids and started on insulin drip.  He is mentating well.  Patient would benefit from admission given poor control of diabetes.  Patient's significant weight loss with generalized abdominal pain and history of hyperlipidemia, diabetes hypertension will obtain CT Angie of abdomen to rule any acute findings such as acute mesenteric ischemia.  CTA of abdomen and pelvis was reassuring without any acute findings chronic findings were noted.  Patient does report chest pain while in the ED.  Repeat EKG was similar to prior tracing.  Negative first troponin and will repeat troponin.  Patient's pain improved with morphine.  Doubt ACS however troponins will need to be trended.  Was discussed with hospital medicine who agrees to admission will see patient in the ED and place admission orders.  Patient remains hemodynamically stable at this time.  Updated on plan of care.  Patient was seen and evaluated with  my attending who is agreeable the above plan.  Final Clinical Impressions(s) / ED Diagnoses  Final diagnoses:  Dehydration  Hyperglycemia  Chest pain, unspecified type  Non-intractable vomiting with nausea, unspecified vomiting type  Generalized abdominal pain    ED Discharge Orders    None       Aaron Edelman 01/30/18 Jacklynn Barnacle, MD 01/30/18 1011

## 2018-01-31 ENCOUNTER — Observation Stay (HOSPITAL_BASED_OUTPATIENT_CLINIC_OR_DEPARTMENT_OTHER): Payer: Medicaid Other

## 2018-01-31 DIAGNOSIS — I420 Dilated cardiomyopathy: Secondary | ICD-10-CM | POA: Diagnosis not present

## 2018-01-31 DIAGNOSIS — E1100 Type 2 diabetes mellitus with hyperosmolarity without nonketotic hyperglycemic-hyperosmolar coma (NKHHC): Secondary | ICD-10-CM | POA: Diagnosis not present

## 2018-01-31 DIAGNOSIS — Z720 Tobacco use: Secondary | ICD-10-CM

## 2018-01-31 DIAGNOSIS — I10 Essential (primary) hypertension: Secondary | ICD-10-CM

## 2018-01-31 DIAGNOSIS — E0800 Diabetes mellitus due to underlying condition with hyperosmolarity without nonketotic hyperglycemic-hyperosmolar coma (NKHHC): Secondary | ICD-10-CM

## 2018-01-31 DIAGNOSIS — I714 Abdominal aortic aneurysm, without rupture: Secondary | ICD-10-CM

## 2018-01-31 DIAGNOSIS — I361 Nonrheumatic tricuspid (valve) insufficiency: Secondary | ICD-10-CM

## 2018-01-31 DIAGNOSIS — R079 Chest pain, unspecified: Secondary | ICD-10-CM | POA: Diagnosis not present

## 2018-01-31 LAB — GLUCOSE, CAPILLARY
Glucose-Capillary: 316 mg/dL — ABNORMAL HIGH (ref 65–99)
Glucose-Capillary: 190 mg/dL — ABNORMAL HIGH (ref 65–99)

## 2018-01-31 LAB — BASIC METABOLIC PANEL
ANION GAP: 8 (ref 5–15)
BUN: 19 mg/dL (ref 6–20)
CALCIUM: 8.9 mg/dL (ref 8.9–10.3)
CHLORIDE: 102 mmol/L (ref 101–111)
CO2: 28 mmol/L (ref 22–32)
Creatinine, Ser: 0.99 mg/dL (ref 0.61–1.24)
GFR calc Af Amer: >60 mL/min (ref >60)
GFR calc non Af Amer: >60 mL/min (ref >60)
Glucose, Bld: 340 mg/dL — ABNORMAL HIGH (ref 65–99)
POTASSIUM: 4.1 mmol/L (ref 3.5–5.1)
Sodium: 138 mmol/L (ref 135–145)

## 2018-01-31 LAB — ECHOCARDIOGRAM COMPLETE
Height: 71 in
Weight: 2068 oz

## 2018-01-31 LAB — CBC
HEMATOCRIT: 31.9 % — AB (ref 39.0–52.0)
HEMOGLOBIN: 10.5 g/dL — AB (ref 13.0–17.0)
MCH: 28.5 pg (ref 26.0–34.0)
MCHC: 32.9 g/dL (ref 30.0–36.0)
MCV: 86.7 fL (ref 78.0–100.0)
Platelets: 296 10*3/uL (ref 150–400)
RBC: 3.68 MIL/uL — ABNORMAL LOW (ref 4.22–5.81)
RDW: 12.7 % (ref 11.5–15.5)
WBC: 6.3 10*3/uL (ref 4.0–10.5)

## 2018-01-31 MED ORDER — LIVING WELL WITH DIABETES BOOK
Freq: Once | Status: AC
  Administered 2018-01-31: 09:00:00
  Filled 2018-01-31: qty 1

## 2018-01-31 MED ORDER — INSULIN ASPART 100 UNIT/ML ~~LOC~~ SOLN: 0.0000 [IU] | Freq: Three times a day (TID) | SUBCUTANEOUS | 11 refills | Status: DC

## 2018-01-31 NOTE — Discharge Summary (Addendum)
Physician Discharge Summary  Ryan Medina GNF:[id number] DOB: 06-29-1961 DOA: 01/29/2018  PCP: Dorena Dew, FNP  Admit date: 01/29/2018 Discharge date: 01/31/2018  Admitted From: Home  Disposition:  Home   Recommendations for Outpatient Follow-up:  1. Follow up with PCP in 1-2 weeks for titration of insulin  2. Please obtain BMP/CBC in one week 3. Please obtain follow-up ultrasound of AAA in 3 years   Home Health: None  Equipment/Devices: None  Discharge Condition: Good  CODE STATUS: FULL Diet recommendation: Diabetic  Brief/Interim Summary: Mr. Leverette is a 57 y.o. M with CAD, isch CM EF 45%, polysubstance abuse, IDDM, and HTN who presents with abdominal pain, vomiting, malaise, and chest pain.  In ER, found to be in HHS, no acidosis.  Troponins normal and ECG without ischemic changes.  CTA abdomen showed no dissection, mild enteritis, no pancreatitis.  Placed on insulin infusion, IV fluids, admitted.     Hyperglycemic hyperosmolar nonketotic state Treated with IV insulin drip.  Electrolytes resolved.  No acidosis.  He was restarted on his home insulin regimen.  He reports that his blood sugars have been in the 300s range usually over the last year since starting Levemir from 50/50 mix.   He reports he eats very low carbohydrate diet, but when asked about beverages, he reports drinking soda with full sugar often.  Also noted that his urine contained cocaine and opiates.   Vomiting Resolved with resolution of his hyperglycemia and dehydration  Chest pain CT angiogram of the chest was negative for PE.  Serial troponins were negative.  His chest pain resolved with treatment of his vomiting with IV fluids and insulin.  Cocaine cessation recommended.  Abdominal aortic aneurysm Follow-up ultrasound in 3 years  Smoking Substance abuse Smoking cessation was recommended, modalities discussed.  Anemia normocytic Etiology unclear.  Iron studies suggest that he was iron  replete.  Ischemic cardiomyopathy Coronary disease Echocardiogram repeated here, showed EF 30-35%, down from previous.  Asymptomatic. Again, in setting of cocaine and alcohol, which he was counseled to avoid.       Discharge Diagnoses:  Principal Problem:   Hyperosmolar non-ketotic state in patient with type 2 diabetes mellitus ([place]) Active Problems:   Essential hypertension   Dilated cardiomyopathy (HCC)   Chest pain   AAA (abdominal aortic aneurysm) (HCC)   Tobacco abuse   Nausea & vomiting   Diabetes mellitus due to underlying condition with hyperosmolarity without nonketotic hyperglycemic-hyperosmolar coma [facility])    Discharge Instructions  Discharge Instructions    Diet Carb Modified   Complete by:  As directed    Discharge instructions   Complete by:  As directed    From Dr. Loleta Books: You were admitted for upset stomach (probably from a stomach virus) as well as extremely high blood sugar.   You got IV fluids for the stomach virus, and I am glad this has gotten better.   For your diabetes: Resume your home Levemir 20 units in the morning and 30 units at night. Continue your short acting insulin (I have provided a new prescription for Novolog, but if you have an old prescription for Novolog or Humalog, you may take that as well) Use the following sliding scale: For blood sugar 100-150: Take 2 units with the meal For blood sugar 151-200: Take 3 units with the meal For blood sugar 201-250: Take 5 units with the meal For blood sugar 251-300: Take 8 units with the meal For blood sugar 301-350: Take 11 units with the  meal For blood sugar 351-400: Take 15 units with the meal For blood sugar over 400: Take 15 units and call your doctor, day or night   Reduce your intake of soda with sugar (anything not "diet"), juice, Gatorade or sweet tea or other drinks with sugar in them as close to zero as possible.  Follow up with your Primary Care doctor in 1 week for  adjustment of your insulin regimen   Increase activity slowly   Complete by:  As directed      Allergies as of 01/31/2018      Reactions   Lisinopril Anaphylaxis      Medication List    TAKE these medications   albuterol 108 (90 Base) MCG/ACT inhaler Commonly known as:  PROVENTIL HFA;VENTOLIN HFA Inhale 1-2 puffs into the lungs every 6 (six) hours as needed for wheezing or shortness of breath.   amLODipine 10 MG tablet Commonly known as:  NORVASC TAKE 1 TABLET BY MOUTH EVERY DAY What changed:    how much to take  how to take this  when to take this   ASPIR-LOW 81 MG EC tablet Generic drug:  aspirin Take 81 mg by mouth daily.   atorvastatin 20 MG tablet Commonly known as:  LIPITOR Take 1 tablet (20 mg total) by mouth daily.   cyclobenzaprine 10 MG tablet Commonly known as:  FLEXERIL Take 1 tablet (10 mg total) by mouth 2 (two) times daily as needed for muscle spasms.   dicyclomine 20 MG tablet Commonly known as:  BENTYL Take 1 tablet (20 mg total) by mouth 2 (two) times daily.   gabapentin 300 MG capsule Commonly known as:  NEURONTIN Take 1 capsule (300 mg total) by mouth 3 (three) times daily.   glucose blood test strip Commonly known as:  AGAMATRIX PRESTO TEST Check blood sugars prior to meals and at bedtime   hydrochlorothiazide 12.5 MG capsule Commonly known as:  MICROZIDE Take 1 capsule (12.5 mg total) by mouth daily.   HYDROcodone-acetaminophen 5-325 MG tablet Commonly known as:  NORCO/VICODIN Take 1 tablet by mouth every 6 (six) hours as needed.   insulin aspart 100 UNIT/ML injection Commonly known as:  novoLOG Inject 0-15 Units into the skin 3 (three) times daily with meals.   insulin detemir 100 UNIT/ML injection Commonly known as:  LEVEMIR INJECT 30  UNITS in the morning and 20 UNITS at bedtime What changed:    how much to take  how to take this  when to take this  additional instructions   meloxicam 15 MG tablet Commonly known as:   MOBIC TAKE 1 TABLET BY MOUTH EVERY DAY What changed:    how much to take  how to take this  when to take this   metFORMIN 1000 MG tablet Commonly known as:  GLUCOPHAGE TAKE 1 TABLET BY MOUTH 2 TIMES DAILY WITH A MEAL   NITROSTAT 0.4 MG SL tablet Generic drug:  nitroGLYCERIN DISSOLVE ONE TABLET UNDER THE TONGUE EVERY 5 MINUTES AS NEEDED FOR CHEST PAIN UP TO 3 DOSES   ondansetron 4 MG disintegrating tablet Commonly known as:  ZOFRAN ODT Take 1 tablet (4 mg total) by mouth every 8 (eight) hours as needed for nausea or vomiting.       Allergies  Allergen Reactions  . Lisinopril Anaphylaxis    Consultations:  None   Procedures/Studies: Dg Chest 2 View  Result Date: 01/30/2018 CLINICAL DATA:  57 y/o M; 2 months of increased weakness. Abdominal pain and emesis. EXAM:  CHEST - 2 VIEW COMPARISON:  11/12/2017 chest radiograph FINDINGS: Stable heart size and mediastinal contours are within normal limits. Both lungs are clear. The visualized skeletal structures are unremarkable. IMPRESSION: No acute pulmonary process identified. Electronically Signed   By: Kristine Garbe M.D.   On: 01/30/2018 00:11   Ct Angio Chest Pe W Or Wo Contrast  Result Date: 01/30/2018 CLINICAL DATA:  57 year old male with acute chest pain and elevated D-dimer. EXAM: CT ANGIOGRAPHY CHEST WITH CONTRAST TECHNIQUE: Multidetector CT imaging of the chest was performed using the standard protocol during bolus administration of intravenous contrast. Multiplanar CT image reconstructions and MIPs were obtained to evaluate the vascular anatomy. CONTRAST:  166mL ISOVUE-370 IOPAMIDOL (ISOVUE-370) INJECTION 76% COMPARISON:  01/29/2018 and prior chest radiographs. 10/14/2016 and prior chest CTs FINDINGS: Cardiovascular: This is a technically satisfactory study. No pulmonary emboli are identified. Mild cardiomegaly noted. No other significant abnormalities identified. Mediastinum/Nodes: No enlarged mediastinal, hilar,  or axillary lymph nodes. Thyroid gland, trachea, and esophagus demonstrate no significant findings. Lungs/Pleura: No airspace disease, consolidation, mass, suspicious nodule, pleural effusion or pneumothorax noted. A few tiny RIGHT pulmonary nodules are unchanged from 2017-benign. Upper Abdomen: No acute abnormality Musculoskeletal: No chest wall abnormality. No acute or significant osseous findings. Review of the MIP images confirms the above findings. IMPRESSION: 1. No evidence of acute abnormality. No evidence of pulmonary emboli. 2. Mild cardiomegaly. Electronically Signed   By: Margarette Canada M.D.   On: 01/30/2018 16:30   Ct Angio Abdomen W And/or Wo Contrast  Result Date: 01/30/2018 CLINICAL DATA:  57 y/o M; 2 months of increasing weakness. Six episodes of emesis in the last 24 hours. Abdominal pain. EXAM: CT ANGIOGRAPHY ABDOMEN TECHNIQUE: Multidetector CT imaging of the abdomen was performed using the standard protocol during bolus administration of intravenous contrast. Multiplanar reconstructed images and MIPs were obtained and reviewed to evaluate the vascular anatomy. CONTRAST:  164mL ISOVUE-370 IOPAMIDOL (ISOVUE-370) INJECTION 76% COMPARISON:  12/19/2017 chest radiograph. FINDINGS: VASCULAR Aorta: 3 cm aneurysm near the aortic bifurcation. Celiac: Patent without evidence of aneurysm, dissection, vasculitis or significant stenosis. SMA: Patent without evidence of aneurysm, dissection, vasculitis or significant stenosis. Renals: Both renal arteries are patent without evidence of aneurysm, dissection, vasculitis, fibromuscular dysplasia or significant stenosis. IMA: Patent without evidence of aneurysm, dissection, vasculitis or significant stenosis. Inflow: 2.9 cm right common iliac artery and 2.6 cm left common iliac arteries are stable. Poor contrast opacification extending into the inflow due to contrast bolus timing. Veins: No enhancement of the venous system. Review of the MIP images confirms the  above findings. NON-VASCULAR Lower chest: Stable 2 mm right middle lobe nodule. Hepatobiliary: No focal liver abnormality is seen. No gallstones, gallbladder wall thickening, or biliary dilatation. Pancreas: Unremarkable. No pancreatic ductal dilatation or surrounding inflammatory changes. Spleen: Normal in size without focal abnormality. Adrenals/Urinary Tract: Adrenal glands are unremarkable. Kidneys are normal, without renal calculi, focal lesion, or hydronephrosis. Bladder is unremarkable. Stomach/Bowel: No obstructive changes of bowel. Mild wall thickening of bowel loops in the mid abdomen. Appendix not identified, no pericecal inflammation. Moderate volume of stool in the colon. Lymphatic: No enlarged abdominal or pelvic lymph nodes. Other: No abdominal wall hernia or abnormality. No abdominopelvic ascites. Musculoskeletal: Lumbar spondylosis greatest at the L5-S1 level with there is severe disc space narrowing and grade 1 retrolisthesis. IMPRESSION: VASCULAR 1. No vessel occlusion identified. 2. 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 Aorta at bifurcation measures up to 3 cm. Recommend followup by ultrasound in 3  years. This recommendation follows ACR consensus guidelines: White Paper of the ACR Incidental Findings Committee II on Vascular Findings. J Am Coll Radiol 2013; 10:789-794 NON-VASCULAR 1. Mild wall thickening of small bowel loops in mid abdomen may represent enteritis. 2. Advanced lumbar spondylosis with severe discogenic degenerative changes at L5-S1. Electronically Signed   By: Kristine Garbe M.D.   On: 01/30/2018 01:41       Subjective: Feels well.  Good appetite.  No polyuria, polydipsia, blurry vision, malaise.  No chest pain, abdominal pain.  No nausea, vomiting.  Discharge Exam: Vitals:   01/31/18 0435 01/31/18 0935  BP: 121/87 106/79  Pulse: 86 91  Resp: 18 18  Temp: 97.9 F (36.6 C) 98.5 F (36.9 C)  SpO2: 100% 100%   Vitals:    01/30/18 1706 01/30/18 2106 01/31/18 0435 01/31/18 0935  BP: 110/83 106/84 121/87 106/79  Pulse: 82 85 86 91  Resp: 18 18 18 18   Temp: 98 F (36.7 C) 98.6 F (37 C) 97.9 F (36.6 C) 98.5 F (36.9 C)  TempSrc: Oral Oral Oral Oral  SpO2: 100% 100% 100% 100%  Weight:  58.6 kg (129 lb 4 oz)    Height:        General: Pt is alert, awake, not in acute distress, sitting up in bed, eating breakfast, conversational Cardiovascular: RRR, S1/S2 +, no rubs, no gallops Respiratory: CTA bilaterally, no wheezing, no rhonchi Abdominal: Soft, NT, ND, bowel sounds + Extremities: no edema, no cyanosis    The results of significant diagnostics from this hospitalization (including imaging, microbiology, ancillary and laboratory) are listed below for reference.     Microbiology: No results found for this or any previous visit (from the past 240 hour(s)).   Labs: BNP (last 3 results) Recent Labs    01/30/18 0859  BNP 22.9   Basic Metabolic Panel: Recent Labs  Lab 01/29/18 2240 01/30/18 0344 01/30/18 0859 01/31/18 0522  NA 131* 147* 142 138  K 5.6* 3.6 3.4* 4.1  CL 93* 111 106 102  CO2 29 28 27 28   GLUCOSE 869* 143* 138* 340*  BUN 17 14 15 19   CREATININE 1.21 0.98 0.98 0.99  CALCIUM 9.5 9.5 8.9 8.9  MG  --  2.3  --   --    Liver Function Tests: Recent Labs  Lab 01/30/18 0344  AST 14*  ALT 17  ALKPHOS 79  BILITOT 0.4  PROT 6.7  ALBUMIN 3.3*   Recent Labs  Lab 01/30/18 0344  LIPASE 518*   No results for input(s): AMMONIA in the last 168 hours. CBC: Recent Labs  Lab 01/29/18 2240 01/30/18 0344 01/30/18 0859 01/31/18 0522  WBC 6.0 6.9 7.4 6.3  NEUTROABS  --  3.3  --   --   HGB 12.5* 11.9* 11.3* 10.5*  HCT 37.2* 35.4* 33.2* 31.9*  MCV 86.7 85.7 86.2 86.7  PLT 335 385 320 296   Cardiac Enzymes: Recent Labs  Lab 01/30/18 0344 01/30/18 0859  CKTOTAL 89  --   TROPONINI <0.03 <0.03   BNP: Invalid input(s): POCBNP CBG: Recent Labs  Lab 01/30/18 1258  01/30/18 1636 01/30/18 2106 01/31/18 0748 01/31/18 1202  GLUCAP 111* 357* 370* 316* 190*   D-Dimer Recent Labs    01/30/18 0344  DDIMER 0.63*   Hgb A1c No results for input(s): HGBA1C in the last 72 hours. Lipid Profile No results for input(s): CHOL, HDL, LDLCALC, TRIG, CHOLHDL, LDLDIRECT in the last 72 hours. Thyroid function studies Recent Labs  01/30/18 0344  TSH 1.795   Anemia work up Recent Labs    01/30/18 0859  FERRITIN 122  TIBC 288  IRON 69   Urinalysis    Component Value Date/Time   COLORURINE STRAW (A) 01/29/2018 2238   APPEARANCEUR CLEAR 01/29/2018 2238   LABSPEC 1.031 (H) 01/29/2018 2238   PHURINE 5.0 01/29/2018 2238   GLUCOSEU >=500 (A) 01/29/2018 2238   [place] 01/29/2018 2238   [place] 01/29/2018 2238   [place] 01/29/2018 2238   [place] 01/29/2018 2238   UROBILINOGEN 4.0 (H) 12/05/2016 0921   NITRITE NEGATIVE 01/29/2018 2238   LEUKOCYTESUR NEGATIVE 01/29/2018 2238   Sepsis Labs Invalid input(s): PROCALCITONIN,  WBC,  LACTICIDVEN Microbiology No results found for this or any previous visit (from the past 240 hour(s)).   Time coordinating discharge: 25 minutes  SIGNED:   Edwin Dada, MD  [facility] 01/31/2018, 4:24 PM

## 2018-01-31 NOTE — Progress Notes (Signed)
Patient discharged to home with family. IV removed. Telemetry removed. All discharge instructions reviewed. Patient left unit in stable condition.

## 2018-01-31 NOTE — Progress Notes (Signed)
  Echocardiogram 2D Echocardiogram has been performed.  Ryan Medina 01/31/2018, 1:12 PM

## 2018-02-01 LAB — HEMOGLOBIN A1C

## 2018-05-05 ENCOUNTER — Other Ambulatory Visit (HOSPITAL_COMMUNITY): Payer: Self-pay

## 2018-05-05 ENCOUNTER — Ambulatory Visit: Payer: Self-pay | Admitting: Vascular Surgery

## 2018-05-19 ENCOUNTER — Other Ambulatory Visit (HOSPITAL_COMMUNITY): Payer: Self-pay

## 2018-05-19 ENCOUNTER — Ambulatory Visit: Payer: Self-pay | Admitting: Vascular Surgery

## 2018-05-19 ENCOUNTER — Encounter: Payer: Self-pay | Admitting: Vascular Surgery

## 2018-06-21 ENCOUNTER — Other Ambulatory Visit: Payer: Self-pay

## 2018-06-21 ENCOUNTER — Encounter (HOSPITAL_COMMUNITY): Payer: Self-pay | Admitting: Emergency Medicine

## 2018-06-21 ENCOUNTER — Emergency Department (HOSPITAL_COMMUNITY)
Admission: EM | Admit: 2018-06-21 | Discharge: 2018-06-22 | Disposition: A | Discharge status: Home / Self Care | Payer: Medicaid Other | Attending: Emergency Medicine | Admitting: Emergency Medicine

## 2018-06-21 DIAGNOSIS — Z794 Long term (current) use of insulin: Secondary | ICD-10-CM | POA: Insufficient documentation

## 2018-06-21 DIAGNOSIS — I714 Abdominal aortic aneurysm, without rupture: Secondary | ICD-10-CM

## 2018-06-21 DIAGNOSIS — IMO0002 Reserved for concepts with insufficient information to code with codable children: Secondary | ICD-10-CM

## 2018-06-21 DIAGNOSIS — I7143 Infrarenal abdominal aortic aneurysm, without rupture: Secondary | ICD-10-CM

## 2018-06-21 DIAGNOSIS — Z760 Encounter for issue of repeat prescription: Secondary | ICD-10-CM

## 2018-06-21 DIAGNOSIS — R739 Hyperglycemia, unspecified: Secondary | ICD-10-CM

## 2018-06-21 DIAGNOSIS — F17210 Nicotine dependence, cigarettes, uncomplicated: Secondary | ICD-10-CM | POA: Insufficient documentation

## 2018-06-21 DIAGNOSIS — I10 Essential (primary) hypertension: Secondary | ICD-10-CM | POA: Insufficient documentation

## 2018-06-21 DIAGNOSIS — Z79899 Other long term (current) drug therapy: Secondary | ICD-10-CM | POA: Insufficient documentation

## 2018-06-21 DIAGNOSIS — E1111 Type 2 diabetes mellitus with ketoacidosis with coma: Secondary | ICD-10-CM

## 2018-06-21 DIAGNOSIS — E1165 Type 2 diabetes mellitus with hyperglycemia: Secondary | ICD-10-CM | POA: Insufficient documentation

## 2018-06-21 DIAGNOSIS — E118 Type 2 diabetes mellitus with unspecified complications: Secondary | ICD-10-CM

## 2018-06-21 LAB — COMPREHENSIVE METABOLIC PANEL
ALK PHOS: 89 U/L (ref 38–126)
ALT: 20 U/L (ref 0–44)
AST: 14 U/L — AB (ref 15–41)
Albumin: 3.6 g/dL (ref 3.5–5.0)
Anion gap: 14 (ref 5–15)
BUN: 17 mg/dL (ref 6–20)
CALCIUM: 9.2 mg/dL (ref 8.9–10.3)
CHLORIDE: 92 mmol/L — AB (ref 98–111)
CO2: 24 mmol/L (ref 22–32)
CREATININE: 1.26 mg/dL — AB (ref 0.61–1.24)
Glucose, Bld: 619 mg/dL (ref 70–99)
Potassium: 4.3 mmol/L (ref 3.5–5.1)
Sodium: 130 mmol/L — ABNORMAL LOW (ref 135–145)
Total Bilirubin: 1 mg/dL (ref 0.3–1.2)
Total Protein: 7 g/dL (ref 6.5–8.1)

## 2018-06-21 LAB — URINALYSIS, ROUTINE W REFLEX MICROSCOPIC
BACTERIA UA: NONE SEEN
Bilirubin Urine: NEGATIVE
Hgb urine dipstick: NEGATIVE
Ketones, ur: 20 mg/dL — AB
Leukocytes, UA: NEGATIVE
NITRITE: NEGATIVE
PH: 5 (ref 5.0–8.0)
Protein, ur: NEGATIVE mg/dL
SPECIFIC GRAVITY, URINE: 1.029 (ref 1.005–1.030)

## 2018-06-21 LAB — CBC WITH DIFFERENTIAL/PLATELET
ABS IMMATURE GRANULOCYTES: 0 10*3/uL (ref 0.0–0.1)
Basophils Absolute: 0 10*3/uL (ref 0.0–0.1)
Basophils Relative: 0 %
Eosinophils Absolute: 0.1 10*3/uL (ref 0.0–0.7)
Eosinophils Relative: 1 %
HCT: 37 % — ABNORMAL LOW (ref 39.0–52.0)
HEMOGLOBIN: 12.3 g/dL — AB (ref 13.0–17.0)
IMMATURE GRANULOCYTES: 0 %
LYMPHS ABS: 2.5 10*3/uL (ref 0.7–4.0)
LYMPHS PCT: 35 %
MCH: 28.5 pg (ref 26.0–34.0)
MCHC: 33.2 g/dL (ref 30.0–36.0)
MCV: 85.6 fL (ref 78.0–100.0)
MONO ABS: 0.5 10*3/uL (ref 0.1–1.0)
MONOS PCT: 7 %
NEUTROS ABS: 4 10*3/uL (ref 1.7–7.7)
NEUTROS PCT: 57 %
PLATELETS: 305 10*3/uL (ref 150–400)
RBC: 4.32 MIL/uL (ref 4.22–5.81)
RDW: 12.4 % (ref 11.5–15.5)
WBC: 7.1 10*3/uL (ref 4.0–10.5)

## 2018-06-21 LAB — CBG MONITORING, ED
Glucose-Capillary: 568 mg/dL (ref 70–99)
Glucose-Capillary: 437 mg/dL — ABNORMAL HIGH (ref 70–99)

## 2018-06-21 MED ORDER — HYDROCHLOROTHIAZIDE 12.5 MG PO CAPS: 12.5000 mg | ORAL_CAPSULE | Freq: Every day | ORAL | Status: DC

## 2018-06-21 MED ORDER — SODIUM CHLORIDE 0.9 % IV BOLUS
1000.0000 mL | Freq: Once | INTRAVENOUS | Status: AC
  Administered 2018-06-21: 1000 mL via INTRAVENOUS

## 2018-06-21 MED ORDER — HYDROCHLOROTHIAZIDE 12.5 MG PO CAPS
12.5000 mg | ORAL_CAPSULE | Freq: Once | ORAL | Status: AC
  Administered 2018-06-22: 12.5 mg via ORAL
  Filled 2018-06-21: qty 1

## 2018-06-21 MED ORDER — INSULIN ASPART 100 UNIT/ML ~~LOC~~ SOLN
10.0000 [IU] | Freq: Once | SUBCUTANEOUS | Status: AC
  Administered 2018-06-21: 10 [IU] via SUBCUTANEOUS
  Filled 2018-06-21: qty 1

## 2018-06-21 NOTE — ED Provider Notes (Signed)
Patient placed in Quick Look pathway, seen and evaluated   Chief Complaint: High sugar  HPI:   Ryan Medina is a 57 y.o. male who presents to ED for elevated blood sugars. Recently released from prison and now has no access to his medications. Been out for 3 days now. Endorses mild abdominal pain, increased thirst, weakness.   ROS: + abdominal pain, polydipsia, weakness  - n/v, fevers  Physical Exam:   Gen: No distress  Neuro: Awake and Alert  Skin: Warm    Focused Exam: No abdominal tenderness.    Initiation of care has begun. The patient has been counseled on the process, plan, and necessity for staying for the completion/evaluation, and the remainder of the medical screening examination    Rasool, Ozella Almond, PA-C 06/21/18 1843    Blanchie Dessert, MD 06/22/18 [phone]

## 2018-06-21 NOTE — ED Triage Notes (Signed)
Pt presents with concern that glucose is high, recently incarcerated and no longer has access to antidiabetic medication or insulin; pt states been without it for 3 days; increased thirst, polyuria, weakness, abd pain

## 2018-06-21 NOTE — ED Notes (Signed)
Pt's CBG result was 568. Informed Clarise Cruz - RN.

## 2018-06-21 NOTE — ED Notes (Signed)
Pt unable to urinate at this time. Pt will try later.

## 2018-06-21 NOTE — ED Provider Notes (Signed)
[facility] EMERGENCY DEPARTMENT Provider Note   CSN: [id number] Arrival date & time: 06/21/18  1831     History   Chief Complaint Chief Complaint  Patient presents with  . Hyperglycemia    HPI Ryan Medina is a 57 y.o. male.  Patient with past medical history remarkable for hypertension, diabetes, presents to the emergency department with a chief complaint of hypoglycemia.  He states that he was recently released from prison, and states that he has been receiving medical care while in prison.  He reports that he has not had his metformin or his insulin.  States that his blood sugars have been running high.  He reports associated polyuria and polydipsia.  He denies any fevers, chills, or vomiting.  He states that he has had some nausea.  He denies any other associated symptoms.  He does not have a primary care doctor.  The history is provided by the patient. No language interpreter was used.    Past Medical History:  Diagnosis Date  . Back pain, chronic   . Diabetes ([place])   . Dilated cardiomyopathy ([place])    Echo 2/18: EF 45-50, diff HK, Gr 1 DD, trivial AI/TR  . HLD (hyperlipidemia)   . Hypertension   . IDDM (insulin dependent diabetes mellitus) ([place])   . Ruptured lumbar disc     Patient Active Problem List   Diagnosis Date Noted  . Hyperosmolar non-ketotic state in patient with type 2 diabetes mellitus ([place]) 01/30/2018  . Chest pain 01/30/2018  . AAA (abdominal aortic aneurysm) ([place]) 01/30/2018  . Tobacco abuse 01/30/2018  . Nausea & vomiting 01/30/2018  . Diabetes mellitus due to underlying condition with hyperosmolarity without nonketotic hyperglycemic-hyperosmolar coma [facility]) ([place]) 01/30/2018  . Dilated cardiomyopathy ([place])   . Other hyperlipidemia 11/11/2016  . Abnormal nuclear stress test 05/19/2016  . Atypical angina ([place]) 04/25/2016  . DM (diabetes mellitus), type 2, uncontrolled ([place]) 03/14/2015  . Essential hypertension 03/14/2015  .  Hyperglycemia 01/14/2015    Past Surgical History:  Procedure Laterality Date  . ABDOMINAL SURGERY    . APPENDECTOMY    . BACK SURGERY    . CARDIAC CATHETERIZATION N/A 05/19/2016   Procedure: Left Heart Cath and Coronary Angiography;  Surgeon: Leonie Man, MD;  Location: [facility];  Service: Cardiovascular;  Laterality: N/A;        Home Medications    Prior to Admission medications   Medication Sig Start Date End Date Taking? Authorizing Provider  albuterol (PROVENTIL HFA;VENTOLIN HFA) 108 (90 Base) MCG/ACT inhaler Inhale 1-2 puffs into the lungs every 6 (six) hours as needed for wheezing or shortness of breath.    [provider]  amLODipine (NORVASC) 10 MG tablet TAKE 1 TABLET BY MOUTH EVERY DAY Patient taking differently: TAKE 1 TABLET (10 MG TOTALLY) BY MOUTH EVERY DAY 01/29/17   Dorena Dew, FNP  ASPIR-LOW 81 MG EC tablet Take 81 mg by mouth daily. 01/29/17   [provider]  atorvastatin (LIPITOR) 20 MG tablet Take 1 tablet (20 mg total) by mouth daily. 11/07/16   Dorena Dew, FNP  cyclobenzaprine (FLEXERIL) 10 MG tablet Take 1 tablet (10 mg total) by mouth 2 (two) times daily as needed for muscle spasms. 12/07/16   Kirichenko, Tatyana, PA-C  dicyclomine (BENTYL) 20 MG tablet Take 1 tablet (20 mg total) by mouth 2 (two) times daily. 11/16/17   Langston Masker B, PA-C  gabapentin (NEURONTIN) 300 MG capsule Take 1 capsule (300  mg total) by mouth 3 (three) times daily. 12/05/16   Dorena Dew, FNP  glucose blood (AGAMATRIX PRESTO TEST) test strip Check blood sugars prior to meals and at bedtime 11/07/16   Dorena Dew, FNP  hydrochlorothiazide (MICROZIDE) 12.5 MG capsule Take 1 capsule (12.5 mg total) by mouth daily. 11/12/16 01/30/18  Richardson Dopp T, PA-C  HYDROcodone-acetaminophen (NORCO/VICODIN) 5-325 MG tablet Take 1 tablet by mouth every 6 (six) hours as needed. Patient taking differently: Take 1 tablet by mouth every 6 (six) hours as  needed.  03/17/17   Drenda Freeze, MD  insulin aspart (NOVOLOG) 100 UNIT/ML injection Inject 0-15 Units into the skin 3 (three) times daily with meals. 01/31/18   Danford, Suann Larry, MD  insulin detemir (LEVEMIR) 100 UNIT/ML injection INJECT 30  UNITS in the morning and 20 UNITS at bedtime Patient taking differently: Inject 20-30 Units into the skin 2 (two) times daily. 20 units in the morning and 30 units at night. 12/05/16   Dorena Dew, FNP  meloxicam (MOBIC) 15 MG tablet TAKE 1 TABLET BY MOUTH EVERY DAY Patient taking differently: TAKE 1 TABLET (15 MG TOTALLY) BY MOUTH EVERY DAY 01/29/17   Dorena Dew, FNP  metFORMIN (GLUCOPHAGE) 1000 MG tablet TAKE 1 TABLET BY MOUTH 2 TIMES DAILY WITH A MEAL 01/29/17   Hollis, Asencion Partridge, FNP  NITROSTAT 0.4 MG SL tablet DISSOLVE ONE TABLET UNDER THE TONGUE EVERY 5 MINUTES AS NEEDED FOR CHEST PAIN UP TO 3 DOSES 01/29/17   Dorena Dew, FNP  ondansetron (ZOFRAN ODT) 4 MG disintegrating tablet Take 1 tablet (4 mg total) by mouth every 8 (eight) hours as needed for nausea or vomiting. 12/19/17   Gareth Morgan, MD    Family History Family History  Problem Relation Age of Onset  . Diabetes Mother   . Heart failure Mother   . Diabetes Sister   . Diabetes Brother     Social History Social History   Tobacco Use  . Smoking status: Current Every Day Smoker    Packs/day: 0.25    Years: 25.00    Pack years: 6.25    Types: Cigarettes  . Smokeless tobacco: Never Used  . Tobacco comment: 2 per day   Substance Use Topics  . Alcohol use: No    Frequency: Never  . Drug use: No     Allergies   Lisinopril   Review of Systems Review of Systems  All other systems reviewed and are negative.    Physical Exam Updated Vital Signs BP (!) 131/98 (BP Location: Right Arm)   Pulse 80   Temp 98.1 F (36.7 C) (Oral)   Resp 18   Ht 5\' 11"  (1.803 m)   Wt 66.7 kg   SpO2 100%   BMI 20.50 kg/m   Physical Exam  Constitutional: He is  oriented to person, place, and time. He appears well-developed and well-nourished.  HENT:  Head: Normocephalic and atraumatic.  Eyes: Pupils are equal, round, and reactive to light. Conjunctivae and EOM are normal. Right eye exhibits no discharge. Left eye exhibits no discharge. No scleral icterus.  Neck: Normal range of motion. Neck supple. No JVD present.  Cardiovascular: Normal rate, regular rhythm and normal heart sounds. Exam reveals no gallop and no friction rub.  No murmur heard. Pulmonary/Chest: Effort normal and breath sounds normal. No respiratory distress. He has no wheezes. He has no rales. He exhibits no tenderness.  Abdominal: Soft. He exhibits no distension and no mass. There  is no tenderness. There is no rebound and no guarding.  Musculoskeletal: Normal range of motion. He exhibits no edema or tenderness.  Neurological: He is alert and oriented to person, place, and time.  Skin: Skin is warm and dry.  Psychiatric: He has a normal mood and affect. His behavior is normal. Judgment and thought content normal.  Nursing note and vitals reviewed.    ED Treatments / Results  Labs (all labs ordered are listed, but only abnormal results are displayed) Labs Reviewed  CBC WITH DIFFERENTIAL/PLATELET - Abnormal; Notable for the following components:      Result Value   Hemoglobin 12.3 (*)    HCT 37.0 (*)    All other components within normal limits  COMPREHENSIVE METABOLIC PANEL - Abnormal; Notable for the following components:   Sodium 130 (*)    Chloride 92 (*)    Glucose, Bld 619 (*)    Creatinine, Ser 1.26 (*)    AST 14 (*)    All other components within normal limits  URINALYSIS, ROUTINE W REFLEX MICROSCOPIC - Abnormal; Notable for the following components:   Color, Urine STRAW (*)    Glucose, UA >=500 (*)    Ketones, ur 20 (*)    All other components within normal limits  CBG MONITORING, ED - Abnormal; Notable for the following components:   Glucose-Capillary 568 (*)      All other components within normal limits  CBG MONITORING, ED - Abnormal; Notable for the following components:   Glucose-Capillary 437 (*)    All other components within normal limits  CBG MONITORING, ED - Abnormal; Notable for the following components:   Glucose-Capillary 327 (*)    All other components within normal limits  CBG MONITORING, ED    EKG None  Radiology No results found.  Procedures Procedures (including critical care time)  Medications Ordered in ED Medications  sodium chloride 0.9 % bolus 1,000 mL (has no administration in time range)  insulin aspart (novoLOG) injection 10 Units (has no administration in time range)  hydrochlorothiazide (MICROZIDE) capsule 12.5 mg (has no administration in time range)     Initial Impression / Assessment and Plan / ED Course  I have reviewed the triage vital signs and the nursing notes.  Pertinent labs & imaging results that were available during my care of the patient were reviewed by me and considered in my medical decision making (see chart for details).     Patient with polyuria and polydipsia, he is noted to be quite hyperglycemic.  We will give fluids and insulin.  Patient has not been taking his regular medications because he does not have access to care nor does he have any of his medications.  We will need to refill his medications and help him get primary care.  Blood glucose is trending down nicely.  We will discharged home with refill of prescriptions.  PCP follow-up.  Final Clinical Impressions(s) / ED Diagnoses   Final diagnoses:  Hyperglycemia  Medication refill    ED Discharge Orders         Ordered    metFORMIN (GLUCOPHAGE) 1000 MG tablet    Note to Pharmacy:  This prescription was filled on 01/29/2017. Any refills authorized will be placed on file.   06/22/18 0045    meloxicam (MOBIC) 15 MG tablet  Daily    Note to Pharmacy:  This prescription was filled on 01/29/2017. Any refills authorized will  be placed on file.   06/22/18 0045  insulin detemir (LEVEMIR) 100 UNIT/ML injection     06/22/18 0045    insulin aspart (NOVOLOG) 100 UNIT/ML injection  3 times daily with meals     06/22/18 0045    glucose blood (AGAMATRIX PRESTO TEST) test strip     06/22/18 0045    atorvastatin (LIPITOR) 20 MG tablet  Daily     06/22/18 0045    amLODipine (NORVASC) 10 MG tablet  Daily    Note to Pharmacy:  This prescription was filled on 01/29/2017. Any refills authorized will be placed on file.   06/22/18 0045           Montine Circle, PA-C 06/22/18 Ninfa Meeker    Quintella Reichert, MD 06/22/18 [phone]

## 2018-06-22 LAB — CBG MONITORING, ED: GLUCOSE-CAPILLARY: 327 mg/dL — AB (ref 70–99)

## 2018-06-22 MED ORDER — MELOXICAM 15 MG PO TABS: 15.0000 mg | ORAL_TABLET | Freq: Every day | ORAL | 0 refills | Status: DC

## 2018-06-22 MED ORDER — INSULIN ASPART 100 UNIT/ML ~~LOC~~ SOLN: 0.0000 [IU] | Freq: Three times a day (TID) | SUBCUTANEOUS | 0 refills | Status: DC

## 2018-06-22 MED ORDER — INSULIN DETEMIR 100 UNIT/ML ~~LOC~~ SOLN: SUBCUTANEOUS | 0 refills | Status: DC

## 2018-06-22 MED ORDER — ATORVASTATIN CALCIUM 20 MG PO TABS: 20.0000 mg | ORAL_TABLET | Freq: Every day | ORAL | 0 refills | Status: DC

## 2018-06-22 MED ORDER — METFORMIN HCL 1000 MG PO TABS: ORAL_TABLET | ORAL | 0 refills | Status: DC

## 2018-06-22 MED ORDER — AMLODIPINE BESYLATE 10 MG PO TABS: 10.0000 mg | ORAL_TABLET | Freq: Every day | ORAL | 0 refills | Status: DC

## 2018-06-22 MED ORDER — GLUCOSE BLOOD VI STRP: ORAL_STRIP | 0 refills | Status: DC

## 2018-06-22 NOTE — Discharge Instructions (Signed)
Please contact the provider listed. They can help you get refills on your meds and get squared away with a primary care doctor.

## 2018-07-13 ENCOUNTER — Encounter (HOSPITAL_COMMUNITY): Payer: Self-pay | Admitting: Emergency Medicine

## 2018-07-13 ENCOUNTER — Emergency Department (HOSPITAL_COMMUNITY)
Admission: EM | Admit: 2018-07-13 | Discharge: 2018-07-14 | Disposition: A | Discharge status: Home / Self Care | Payer: Self-pay | Attending: Emergency Medicine | Admitting: Emergency Medicine

## 2018-07-13 DIAGNOSIS — I10 Essential (primary) hypertension: Secondary | ICD-10-CM | POA: Insufficient documentation

## 2018-07-13 DIAGNOSIS — I7143 Infrarenal abdominal aortic aneurysm, without rupture: Secondary | ICD-10-CM

## 2018-07-13 DIAGNOSIS — Z794 Long term (current) use of insulin: Secondary | ICD-10-CM | POA: Insufficient documentation

## 2018-07-13 DIAGNOSIS — F17210 Nicotine dependence, cigarettes, uncomplicated: Secondary | ICD-10-CM | POA: Insufficient documentation

## 2018-07-13 DIAGNOSIS — R739 Hyperglycemia, unspecified: Secondary | ICD-10-CM

## 2018-07-13 DIAGNOSIS — E1165 Type 2 diabetes mellitus with hyperglycemia: Secondary | ICD-10-CM | POA: Insufficient documentation

## 2018-07-13 DIAGNOSIS — I714 Abdominal aortic aneurysm, without rupture: Secondary | ICD-10-CM

## 2018-07-13 DIAGNOSIS — E118 Type 2 diabetes mellitus with unspecified complications: Secondary | ICD-10-CM

## 2018-07-13 DIAGNOSIS — Z79899 Other long term (current) drug therapy: Secondary | ICD-10-CM | POA: Insufficient documentation

## 2018-07-13 DIAGNOSIS — IMO0002 Reserved for concepts with insufficient information to code with codable children: Secondary | ICD-10-CM

## 2018-07-13 LAB — BASIC METABOLIC PANEL
ANION GAP: 13 (ref 5–15)
BUN: 12 mg/dL (ref 6–20)
CHLORIDE: 96 mmol/L — AB (ref 98–111)
CO2: 22 mmol/L (ref 22–32)
Calcium: 9.2 mg/dL (ref 8.9–10.3)
Creatinine, Ser: 1.08 mg/dL (ref 0.61–1.24)
GFR calc non Af Amer: >60 mL/min (ref >60)
Glucose, Bld: 659 mg/dL (ref 70–99)
POTASSIUM: 4.6 mmol/L (ref 3.5–5.1)
SODIUM: 131 mmol/L — AB (ref 135–145)

## 2018-07-13 LAB — URINALYSIS, ROUTINE W REFLEX MICROSCOPIC
BILIRUBIN URINE: NEGATIVE
Bacteria, UA: NONE SEEN
Hgb urine dipstick: NEGATIVE
Ketones, ur: 20 mg/dL — AB
LEUKOCYTES UA: NEGATIVE
NITRITE: NEGATIVE
PH: 5 (ref 5.0–8.0)
Protein, ur: NEGATIVE mg/dL
SPECIFIC GRAVITY, URINE: 1.03 (ref 1.005–1.030)

## 2018-07-13 LAB — CBC
HCT: 41.5 % (ref 39.0–52.0)
HEMOGLOBIN: 13.4 g/dL (ref 13.0–17.0)
MCH: 28.2 pg (ref 26.0–34.0)
MCHC: 32.3 g/dL (ref 30.0–36.0)
MCV: 87.2 fL (ref 80.0–100.0)
NRBC: 0 % (ref 0.0–0.2)
Platelets: 272 10*3/uL (ref 150–400)
RBC: 4.76 MIL/uL (ref 4.22–5.81)
RDW: 13.1 % (ref 11.5–15.5)
WBC: 5.9 10*3/uL (ref 4.0–10.5)

## 2018-07-13 LAB — CBG MONITORING, ED
GLUCOSE-CAPILLARY: 291 mg/dL — AB (ref 70–99)
Glucose-Capillary: >600 mg/dL (ref 70–99)

## 2018-07-13 MED ORDER — SODIUM CHLORIDE 0.9 % IV BOLUS
1000.0000 mL | Freq: Once | INTRAVENOUS | Status: AC
  Administered 2018-07-13: 1000 mL via INTRAVENOUS

## 2018-07-13 MED ORDER — INSULIN ASPART 100 UNIT/ML ~~LOC~~ SOLN
SUBCUTANEOUS | Status: AC
  Filled 2018-07-13: qty 1

## 2018-07-13 MED ORDER — INSULIN ASPART 100 UNIT/ML ~~LOC~~ SOLN
10.0000 [IU] | Freq: Once | SUBCUTANEOUS | Status: AC
  Administered 2018-07-13: 10 [IU] via INTRAVENOUS

## 2018-07-13 NOTE — ED Provider Notes (Signed)
[facility] EMERGENCY DEPARTMENT Provider Note   CSN: [id number] Arrival date & time: 07/13/18  2120     History   Chief Complaint Chief Complaint  Patient presents with  . Hyperglycemia    HPI Ryan Medina is a 57 y.o. male.  Patient with past medical history remarkable for diabetes, his insulin controlled, presents to the emergency department with a chief complaint of hyperglycemia.  He reports that he does not have his insurance or Medicaid, and has been unable to get his prescriptions.  I saw this patient last time he was in the emergency department, but he states that he was unable to fill the prescriptions I gave him.  He reports that he has had polydipsia, polyuria, and some nausea and vomiting.  He states that he feels very fatigued.  He denies any other associated symptoms.  The history is provided by the patient. No language interpreter was used.    Past Medical History:  Diagnosis Date  . Back pain, chronic   . Diabetes ([place])   . Dilated cardiomyopathy ([place])    Echo 2/18: EF 45-50, diff HK, Gr 1 DD, trivial AI/TR  . HLD (hyperlipidemia)   . Hypertension   . IDDM (insulin dependent diabetes mellitus) ([place])   . Ruptured lumbar disc     Patient Active Problem List   Diagnosis Date Noted  . Hyperosmolar non-ketotic state in patient with type 2 diabetes mellitus ([place]) 01/30/2018  . Chest pain 01/30/2018  . AAA (abdominal aortic aneurysm) ([place]) 01/30/2018  . Tobacco abuse 01/30/2018  . Nausea & vomiting 01/30/2018  . Diabetes mellitus due to underlying condition with hyperosmolarity without nonketotic hyperglycemic-hyperosmolar coma [facility]) ([place]) 01/30/2018  . Dilated cardiomyopathy ([place])   . Other hyperlipidemia 11/11/2016  . Abnormal nuclear stress test 05/19/2016  . Atypical angina ([place]) 04/25/2016  . DM (diabetes mellitus), type 2, uncontrolled ([place]) 03/14/2015  . Essential hypertension 03/14/2015  . Hyperglycemia 01/14/2015    Past  Surgical History:  Procedure Laterality Date  . ABDOMINAL SURGERY    . APPENDECTOMY    . BACK SURGERY    . CARDIAC CATHETERIZATION N/A 05/19/2016   Procedure: Left Heart Cath and Coronary Angiography;  Surgeon: Leonie Man, MD;  Location: [facility];  Service: Cardiovascular;  Laterality: N/A;        Home Medications    Prior to Admission medications   Medication Sig Start Date End Date Taking? Authorizing Provider  albuterol (PROVENTIL HFA;VENTOLIN HFA) 108 (90 Base) MCG/ACT inhaler Inhale 1-2 puffs into the lungs every 6 (six) hours as needed for wheezing or shortness of breath.    [provider]  amLODipine (NORVASC) 10 MG tablet Take 1 tablet (10 mg total) by mouth daily. 06/22/18   Montine Circle, PA-C  atorvastatin (LIPITOR) 20 MG tablet Take 1 tablet (20 mg total) by mouth daily. 06/22/18   Montine Circle, PA-C  cyclobenzaprine (FLEXERIL) 10 MG tablet Take 1 tablet (10 mg total) by mouth 2 (two) times daily as needed for muscle spasms. Patient not taking: Reported on 06/21/2018 12/07/16   Jeannett Senior, PA-C  dicyclomine (BENTYL) 20 MG tablet Take 1 tablet (20 mg total) by mouth 2 (two) times daily. Patient not taking: Reported on 06/21/2018 11/16/17   Langston Masker B, PA-C  gabapentin (NEURONTIN) 300 MG capsule Take 1 capsule (300 mg total) by mouth 3 (three) times daily. Patient not taking: Reported on 06/21/2018 12/05/16   Dorena Dew, FNP  glucose blood (AGAMATRIX PRESTO  TEST) test strip Check blood sugars prior to meals and at bedtime 06/22/18   Montine Circle, PA-C  hydrochlorothiazide (MICROZIDE) 12.5 MG capsule Take 1 capsule (12.5 mg total) by mouth daily. 11/12/16 01/30/18  Richardson Dopp T, PA-C  HYDROcodone-acetaminophen (NORCO/VICODIN) 5-325 MG tablet Take 1 tablet by mouth every 6 (six) hours as needed. 03/17/17   Drenda Freeze, MD  insulin aspart (NOVOLOG) 100 UNIT/ML injection Inject 0-15 Units into the skin 3 (three) times daily with  meals. 06/22/18   Montine Circle, PA-C  insulin detemir (LEVEMIR) 100 UNIT/ML injection INJECT 30  UNITS in the morning and 20 UNITS at bedtime 06/22/18   Montine Circle, PA-C  insulin NPH Human (NOVOLIN N RELION) 100 UNIT/ML injection Inject 24 Units into the skin 2 (two) times daily.    [provider]  insulin regular (NOVOLIN R,HUMULIN R) 100 units/mL injection Inject 1-8 Units into the skin 3 (three) times daily as needed for high blood sugar.    [provider]  meloxicam (MOBIC) 15 MG tablet Take 1 tablet (15 mg total) by mouth daily. 06/22/18   Montine Circle, PA-C  metFORMIN (GLUCOPHAGE) 1000 MG tablet TAKE 1 TABLET BY MOUTH 2 TIMES DAILY WITH A MEAL 06/22/18   Tita Terhaar, PA-C  NITROSTAT 0.4 MG SL tablet DISSOLVE ONE TABLET UNDER THE TONGUE EVERY 5 MINUTES AS NEEDED FOR CHEST PAIN UP TO 3 DOSES Patient taking differently: Place 0.4 mg under the tongue every 5 (five) minutes as needed for chest pain.  01/29/17   Dorena Dew, FNP  ondansetron (ZOFRAN ODT) 4 MG disintegrating tablet Take 1 tablet (4 mg total) by mouth every 8 (eight) hours as needed for nausea or vomiting. Patient not taking: Reported on 06/21/2018 12/19/17   Gareth Morgan, MD    Family History Family History  Problem Relation Age of Onset  . Diabetes Mother   . Heart failure Mother   . Diabetes Sister   . Diabetes Brother     Social History Social History   Tobacco Use  . Smoking status: Current Every Day Smoker    Packs/day: 0.25    Years: 25.00    Pack years: 6.25    Types: Cigarettes  . Smokeless tobacco: Never Used  . Tobacco comment: 2 per day   Substance Use Topics  . Alcohol use: No    Frequency: Never  . Drug use: No     Allergies   Lisinopril   Review of Systems Review of Systems  All other systems reviewed and are negative.    Physical Exam Updated Vital Signs BP (!) 136/105   Pulse 93   Temp 98.8 F (37.1 C) (Oral)   Resp 16   Ht 5\' 11"  (1.803  m)   Wt 66.7 kg   SpO2 100%   BMI 20.50 kg/m   Physical Exam  Constitutional: He is oriented to person, place, and time. He appears well-developed and well-nourished.  HENT:  Head: Normocephalic and atraumatic.  Eyes: Pupils are equal, round, and reactive to light. Conjunctivae and EOM are normal. Right eye exhibits no discharge. Left eye exhibits no discharge. No scleral icterus.  Neck: Normal range of motion. Neck supple. No JVD present.  Cardiovascular: Normal rate, regular rhythm and normal heart sounds. Exam reveals no gallop and no friction rub.  No murmur heard. Pulmonary/Chest: Effort normal and breath sounds normal. No respiratory distress. He has no wheezes. He has no rales. He exhibits no tenderness.  Abdominal: Soft. He exhibits no distension  and no mass. There is no tenderness. There is no rebound and no guarding.  Musculoskeletal: Normal range of motion. He exhibits no edema or tenderness.  Neurological: He is alert and oriented to person, place, and time.  Skin: Skin is warm and dry.  Psychiatric: He has a normal mood and affect. His behavior is normal. Judgment and thought content normal.  Nursing note and vitals reviewed.    ED Treatments / Results  Labs (all labs ordered are listed, but only abnormal results are displayed) Labs Reviewed  CBG MONITORING, ED - Abnormal; Notable for the following components:      Result Value   Glucose-Capillary >600 (*)    All other components within normal limits  CBC  BASIC METABOLIC PANEL  URINALYSIS, ROUTINE W REFLEX MICROSCOPIC    EKG None  Radiology No results found.  Procedures Procedures (including critical care time)  Medications Ordered in ED Medications  sodium chloride 0.9 % bolus 1,000 mL (1,000 mLs Intravenous New Bag/Given 07/13/18 2229)     Initial Impression / Assessment and Plan / ED Course  I have reviewed the triage vital signs and the nursing notes.  Pertinent labs & imaging results that were  available during my care of the patient were reviewed by me and considered in my medical decision making (see chart for details).     Patient with hyperglycemia.  He is insulin controlled, but has not had his medications due to financial issues.  His CBG is greater than 600 in triage.  Start fluids.  Still waiting for basic metabolic panel.  Anticipate giving IV insulin.  He does not appear ill.  I doubt DKA.  Blood sugar trending down nicely.  226 now.  Social work is seen the patient.  Patient will be set up with the match program so that he can get his prescriptions filled.  He is stable for discharge.  Final Clinical Impressions(s) / ED Diagnoses   Final diagnoses:  Hyperglycemia    ED Discharge Orders    None       Montine Circle, PA-C 07/14/18 0129    Lajean Saver, MD 07/14/18 1355

## 2018-07-13 NOTE — ED Triage Notes (Signed)
Pt reports he is unable to get his medications due to financial reasons.  States he feels his "sugar is up".

## 2018-07-13 NOTE — Progress Notes (Signed)
CSW met with pt at pt's bedside. Pt reported not being able to afford medications. Pt is in the process of applying for Medicaid. CSW is unable to [facility] pt for prescription, no RNCM on right now to [facility] pt. CSW will leave handoff for daytime RNCM to review pt's prescriptions to see if pt is eligible for the [facility] program.   Pt can be reached at his friend's, Louie Casa, phone at [phone].   Pt requested that his prescriptions be sent to to the [facility] at [address]. CSW updated EDP. CSW will leave handoff for daytime RNCM.   Wendelyn Breslow, [facility]  [phone]

## 2018-07-14 ENCOUNTER — Telehealth: Payer: Self-pay | Admitting: *Deleted

## 2018-07-14 LAB — CBG MONITORING, ED: Glucose-Capillary: 226 mg/dL — ABNORMAL HIGH (ref 70–99)

## 2018-07-14 MED ORDER — AMLODIPINE BESYLATE 10 MG PO TABS: 10.0000 mg | ORAL_TABLET | Freq: Every day | ORAL | 0 refills | Status: DC

## 2018-07-14 MED ORDER — ATORVASTATIN CALCIUM 20 MG PO TABS: 20.0000 mg | ORAL_TABLET | Freq: Every day | ORAL | 0 refills | Status: DC

## 2018-07-14 MED ORDER — INSULIN DETEMIR 100 UNIT/ML ~~LOC~~ SOLN: SUBCUTANEOUS | 0 refills | Status: DC

## 2018-07-14 MED ORDER — METFORMIN HCL 1000 MG PO TABS: ORAL_TABLET | ORAL | 0 refills | Status: DC

## 2018-07-14 NOTE — Telephone Encounter (Signed)
EDCM enrolled pt in [facility] program and faxed card to [facility] on [address] as requested.

## 2018-07-14 NOTE — ED Notes (Signed)
Pt given water upon request following approval of EDP.

## 2018-08-15 ENCOUNTER — Encounter (HOSPITAL_COMMUNITY): Payer: Self-pay | Admitting: Emergency Medicine

## 2018-08-15 ENCOUNTER — Emergency Department (HOSPITAL_COMMUNITY)
Admission: EM | Admit: 2018-08-15 | Discharge: 2018-08-15 | Disposition: A | Discharge status: Home / Self Care | Payer: Self-pay | Attending: Emergency Medicine | Admitting: Emergency Medicine

## 2018-08-15 ENCOUNTER — Other Ambulatory Visit: Payer: Self-pay

## 2018-08-15 ENCOUNTER — Emergency Department (HOSPITAL_COMMUNITY): Payer: Self-pay

## 2018-08-15 DIAGNOSIS — IMO0002 Reserved for concepts with insufficient information to code with codable children: Secondary | ICD-10-CM

## 2018-08-15 DIAGNOSIS — Z794 Long term (current) use of insulin: Secondary | ICD-10-CM | POA: Insufficient documentation

## 2018-08-15 DIAGNOSIS — E118 Type 2 diabetes mellitus with unspecified complications: Secondary | ICD-10-CM

## 2018-08-15 DIAGNOSIS — E1165 Type 2 diabetes mellitus with hyperglycemia: Secondary | ICD-10-CM | POA: Insufficient documentation

## 2018-08-15 DIAGNOSIS — F17210 Nicotine dependence, cigarettes, uncomplicated: Secondary | ICD-10-CM | POA: Insufficient documentation

## 2018-08-15 DIAGNOSIS — Z79899 Other long term (current) drug therapy: Secondary | ICD-10-CM | POA: Insufficient documentation

## 2018-08-15 DIAGNOSIS — R739 Hyperglycemia, unspecified: Secondary | ICD-10-CM

## 2018-08-15 LAB — URINALYSIS, ROUTINE W REFLEX MICROSCOPIC
Bilirubin Urine: NEGATIVE
Glucose, UA: >=500 mg/dL — AB
Hgb urine dipstick: NEGATIVE
KETONES UR: 20 mg/dL — AB
LEUKOCYTES UA: NEGATIVE
NITRITE: NEGATIVE
PH: 5 (ref 5.0–8.0)
Protein, ur: NEGATIVE mg/dL
SPECIFIC GRAVITY, URINE: 1.032 — AB (ref 1.005–1.030)

## 2018-08-15 LAB — I-STAT ARTERIAL BLOOD GAS, ED
ACID-BASE DEFICIT: 1 mmol/L (ref 0.0–2.0)
BICARBONATE: 25.1 mmol/L (ref 20.0–28.0)
O2 SAT: 95 %
TCO2: 26 mmol/L (ref 22–32)
pCO2 arterial: 44.6 mmHg (ref 32.0–48.0)
pH, Arterial: 7.359 (ref 7.350–7.450)
pO2, Arterial: 81 mmHg — ABNORMAL LOW (ref 83.0–108.0)

## 2018-08-15 LAB — COMPREHENSIVE METABOLIC PANEL
ALBUMIN: 3.7 g/dL (ref 3.5–5.0)
ALT: 23 U/L (ref 0–44)
AST: 16 U/L (ref 15–41)
Alkaline Phosphatase: 88 U/L (ref 38–126)
Anion gap: 16 — ABNORMAL HIGH (ref 5–15)
BUN: 12 mg/dL (ref 6–20)
CHLORIDE: 89 mmol/L — AB (ref 98–111)
CO2: 28 mmol/L (ref 22–32)
CREATININE: 1.09 mg/dL (ref 0.61–1.24)
Calcium: 9.5 mg/dL (ref 8.9–10.3)
GFR calc non Af Amer: >60 mL/min (ref >60)
GLUCOSE: 740 mg/dL — AB (ref 70–99)
Potassium: 4.4 mmol/L (ref 3.5–5.1)
SODIUM: 133 mmol/L — AB (ref 135–145)
Total Bilirubin: 0.7 mg/dL (ref 0.3–1.2)
Total Protein: 7.8 g/dL (ref 6.5–8.1)

## 2018-08-15 LAB — I-STAT VENOUS BLOOD GAS, ED
Acid-Base Excess: 7 mmol/L — ABNORMAL HIGH (ref 0.0–2.0)
BICARBONATE: 34.5 mmol/L — AB (ref 20.0–28.0)
O2 Saturation: 22 %
TCO2: 36 mmol/L — AB (ref 22–32)
pCO2, Ven: 60.4 mmHg — ABNORMAL HIGH (ref 44.0–60.0)
pH, Ven: 7.365 (ref 7.250–7.430)
pO2, Ven: 17 mmHg — CL (ref 32.0–45.0)

## 2018-08-15 LAB — RAPID URINE DRUG SCREEN, HOSP PERFORMED
AMPHETAMINES: NOT DETECTED
BARBITURATES: NOT DETECTED
BENZODIAZEPINES: NOT DETECTED
COCAINE: POSITIVE — AB
Opiates: NOT DETECTED
TETRAHYDROCANNABINOL: NOT DETECTED

## 2018-08-15 LAB — BASIC METABOLIC PANEL
Anion gap: 6 (ref 5–15)
BUN: 11 mg/dL (ref 6–20)
CALCIUM: 8.7 mg/dL — AB (ref 8.9–10.3)
CO2: 27 mmol/L (ref 22–32)
Chloride: 103 mmol/L (ref 98–111)
Creatinine, Ser: 0.89 mg/dL (ref 0.61–1.24)
GFR calc Af Amer: >60 mL/min (ref >60)
GFR calc non Af Amer: >60 mL/min (ref >60)
GLUCOSE: 460 mg/dL — AB (ref 70–99)
Potassium: 4.8 mmol/L (ref 3.5–5.1)
Sodium: 136 mmol/L (ref 135–145)

## 2018-08-15 LAB — CBC WITH DIFFERENTIAL/PLATELET
ABS IMMATURE GRANULOCYTES: 0.01 10*3/uL (ref 0.00–0.07)
BASOS ABS: 0 10*3/uL (ref 0.0–0.1)
Basophils Relative: 1 %
Eosinophils Absolute: 0.1 10*3/uL (ref 0.0–0.5)
Eosinophils Relative: 1 %
HCT: 45.7 % (ref 39.0–52.0)
HEMOGLOBIN: 14.1 g/dL (ref 13.0–17.0)
Immature Granulocytes: 0 %
LYMPHS PCT: 27 %
Lymphs Abs: 1.5 10*3/uL (ref 0.7–4.0)
MCH: 27.3 pg (ref 26.0–34.0)
MCHC: 30.9 g/dL (ref 30.0–36.0)
MCV: 88.4 fL (ref 80.0–100.0)
MONO ABS: 0.5 10*3/uL (ref 0.1–1.0)
Monocytes Relative: 9 %
NRBC: 0 % (ref 0.0–0.2)
Neutro Abs: 3.3 10*3/uL (ref 1.7–7.7)
Neutrophils Relative %: 62 %
Platelets: 354 10*3/uL (ref 150–400)
RBC: 5.17 MIL/uL (ref 4.22–5.81)
RDW: 13.4 % (ref 11.5–15.5)
WBC: 5.3 10*3/uL (ref 4.0–10.5)

## 2018-08-15 LAB — LIPASE, BLOOD: Lipase: 54 U/L — ABNORMAL HIGH (ref 11–51)

## 2018-08-15 LAB — CBG MONITORING, ED
GLUCOSE-CAPILLARY: 524 mg/dL — AB (ref 70–99)
Glucose-Capillary: >600 mg/dL (ref 70–99)
Glucose-Capillary: 513 mg/dL (ref 70–99)
Glucose-Capillary: 260 mg/dL — ABNORMAL HIGH (ref 70–99)

## 2018-08-15 MED ORDER — INSULIN ASPART 100 UNIT/ML ~~LOC~~ SOLN
10.0000 [IU] | Freq: Once | SUBCUTANEOUS | Status: AC
  Administered 2018-08-15: 10 [IU] via SUBCUTANEOUS

## 2018-08-15 MED ORDER — INSULIN REGULAR(HUMAN) IN NACL 100-0.9 UT/100ML-% IV SOLN
INTRAVENOUS | Status: DC
  Filled 2018-08-15: qty 100

## 2018-08-15 MED ORDER — FAMOTIDINE IN NACL 20-0.9 MG/50ML-% IV SOLN
20.0000 mg | Freq: Once | INTRAVENOUS | Status: AC
  Administered 2018-08-15: 20 mg via INTRAVENOUS
  Filled 2018-08-15: qty 50

## 2018-08-15 MED ORDER — SODIUM CHLORIDE 0.9 % IV BOLUS
1000.0000 mL | Freq: Once | INTRAVENOUS | Status: AC
  Administered 2018-08-15: 1000 mL via INTRAVENOUS

## 2018-08-15 MED ORDER — ONDANSETRON HCL 4 MG/2ML IJ SOLN
4.0000 mg | Freq: Once | INTRAMUSCULAR | Status: AC
  Administered 2018-08-15: 4 mg via INTRAVENOUS
  Filled 2018-08-15: qty 2

## 2018-08-15 MED ORDER — AMLODIPINE BESYLATE 10 MG PO TABS: 10.0000 mg | ORAL_TABLET | Freq: Every day | ORAL | 0 refills | Status: DC

## 2018-08-15 MED ORDER — INSULIN ASPART 100 UNIT/ML IV SOLN: 10.0000 [IU] | Freq: Once | INTRAVENOUS | Status: DC

## 2018-08-15 MED ORDER — HYDROCHLOROTHIAZIDE 12.5 MG PO CAPS: 12.5000 mg | ORAL_CAPSULE | Freq: Every day | ORAL | 2 refills | Status: DC

## 2018-08-15 MED ORDER — INSULIN ASPART 100 UNIT/ML ~~LOC~~ SOLN: 0.0000 [IU] | Freq: Three times a day (TID) | SUBCUTANEOUS | 0 refills | Status: DC

## 2018-08-15 MED ORDER — INSULIN DETEMIR 100 UNIT/ML ~~LOC~~ SOLN: SUBCUTANEOUS | 0 refills | Status: DC

## 2018-08-15 MED ORDER — DEXTROSE-NACL 5-0.45 % IV SOLN: INTRAVENOUS | Status: DC

## 2018-08-15 MED ORDER — METFORMIN HCL 1000 MG PO TABS: ORAL_TABLET | ORAL | 0 refills | Status: DC

## 2018-08-15 NOTE — ED Provider Notes (Signed)
[facility] Provider Note   CSN: [id number] Arrival date & time: 08/15/18  1638     History   Chief Complaint No chief complaint on file.   HPI Ryan Medina is a 57 y.o. male.  Patient is a 57 year old male with a history of insulin-dependent diabetes, hypertension, substance abuse, chronic back pain and 3 cm AAA who presents with nausea and vomiting.  He states that he has had vomiting since yesterday.  It is nonbilious and nonbloody.  He has some pain to the left side of his abdomen but he says this is chronic for him and unchanged.  He is having normal bowel movements.  No urinary issues although he does have problems with his urine stream which is also a chronic issue.  No known fevers.  He has a little bit of cough.  He feels generally fatigued.  He feels like he is dehydrated and needs IV fluids.  He states he has had the similar symptoms intermittently over the past several years.  He denies any chest pain or shortness of breath.     Past Medical History:  Diagnosis Date  . Back pain, chronic   . Diabetes ([place])   . Dilated cardiomyopathy ([place])    Echo 2/18: EF 45-50, diff HK, Gr 1 DD, trivial AI/TR  . HLD (hyperlipidemia)   . Hypertension   . IDDM (insulin dependent diabetes mellitus) ([place])   . Ruptured lumbar disc     Patient Active Problem List   Diagnosis Date Noted  . Hyperosmolar non-ketotic state in patient with type 2 diabetes mellitus ([place]) 01/30/2018  . Chest pain 01/30/2018  . AAA (abdominal aortic aneurysm) ([place]) 01/30/2018  . Tobacco abuse 01/30/2018  . Nausea & vomiting 01/30/2018  . Diabetes mellitus due to underlying condition with hyperosmolarity without nonketotic hyperglycemic-hyperosmolar coma [facility]) ([place]) 01/30/2018  . Dilated cardiomyopathy ([place])   . Other hyperlipidemia 11/11/2016  . Abnormal nuclear stress test 05/19/2016  . Atypical angina ([place]) 04/25/2016  . DM (diabetes mellitus), type 2,  uncontrolled ([place]) 03/14/2015  . Essential hypertension 03/14/2015  . Hyperglycemia 01/14/2015    Past Surgical History:  Procedure Laterality Date  . ABDOMINAL SURGERY    . APPENDECTOMY    . BACK SURGERY    . CARDIAC CATHETERIZATION N/A 05/19/2016   Procedure: Left Heart Cath and Coronary Angiography;  Surgeon: Leonie Man, MD;  Location: [facility];  Service: Cardiovascular;  Laterality: N/A;        Home Medications    Prior to Admission medications   Medication Sig Start Date End Date Taking? Authorizing Provider  insulin NPH Human (HUMULIN N,NOVOLIN N) 100 UNIT/ML injection Inject into the skin.   Yes [provider]  insulin regular (NOVOLIN R,HUMULIN R) 100 units/mL injection Inject into the skin.   Yes [provider]  NITROSTAT 0.4 MG SL tablet DISSOLVE ONE TABLET UNDER THE TONGUE EVERY 5 MINUTES AS NEEDED FOR CHEST PAIN UP TO 3 DOSES Patient taking differently: Place 0.4 mg under the tongue every 5 (five) minutes as needed for chest pain.  01/29/17  Yes Dorena Dew, FNP  amLODipine (NORVASC) 10 MG tablet Take 1 tablet (10 mg total) by mouth daily. 08/15/18   Malvin Johns, MD  atorvastatin (LIPITOR) 20 MG tablet Take 1 tablet (20 mg total) by mouth daily. Patient not taking: Reported on 08/15/2018 07/14/18   Montine Circle, PA-C  cyclobenzaprine (FLEXERIL) 10 MG tablet Take 1 tablet (10 mg total)  by mouth 2 (two) times daily as needed for muscle spasms. Patient not taking: Reported on 06/21/2018 12/07/16   Jeannett Senior, PA-C  dicyclomine (BENTYL) 20 MG tablet Take 1 tablet (20 mg total) by mouth 2 (two) times daily. Patient not taking: Reported on 06/21/2018 11/16/17   Langston Masker B, PA-C  gabapentin (NEURONTIN) 300 MG capsule Take 1 capsule (300 mg total) by mouth 3 (three) times daily. Patient not taking: Reported on 06/21/2018 12/05/16   Dorena Dew, FNP  glucose blood (AGAMATRIX PRESTO TEST) test strip Check blood sugars  prior to meals and at bedtime 06/22/18   Montine Circle, PA-C  hydrochlorothiazide (MICROZIDE) 12.5 MG capsule Take 1 capsule (12.5 mg total) by mouth daily. 08/15/18 11/13/18  Malvin Johns, MD  HYDROcodone-acetaminophen (NORCO/VICODIN) 5-325 MG tablet Take 1 tablet by mouth every 6 (six) hours as needed. Patient not taking: Reported on 07/13/2018 03/17/17   Drenda Freeze, MD  insulin aspart (NOVOLOG) 100 UNIT/ML injection Inject 0-15 Units into the skin 3 (three) times daily with meals. 08/15/18   Malvin Johns, MD  insulin detemir (LEVEMIR) 100 UNIT/ML injection INJECT 30  UNITS in the morning and 20 UNITS at bedtime 08/15/18   Malvin Johns, MD  meloxicam (MOBIC) 15 MG tablet Take 1 tablet (15 mg total) by mouth daily. Patient not taking: Reported on 07/13/2018 06/22/18   Montine Circle, PA-C  metFORMIN (GLUCOPHAGE) 1000 MG tablet TAKE 1 TABLET BY MOUTH 2 TIMES DAILY WITH A MEAL 08/15/18   Malvin Johns, MD  ondansetron (ZOFRAN ODT) 4 MG disintegrating tablet Take 1 tablet (4 mg total) by mouth every 8 (eight) hours as needed for nausea or vomiting. Patient not taking: Reported on 06/21/2018 12/19/17   Gareth Morgan, MD    Family History Family History  Problem Relation Age of Onset  . Diabetes Mother   . Heart failure Mother   . Diabetes Sister   . Diabetes Brother     Social History Social History   Tobacco Use  . Smoking status: Current Every Day Smoker    Packs/day: 0.25    Years: 25.00    Pack years: 6.25    Types: Cigarettes  . Smokeless tobacco: Never Used  . Tobacco comment: 2 per day   Substance Use Topics  . Alcohol use: No    Frequency: Never  . Drug use: No     Allergies   Lisinopril   Review of Systems Review of Systems  Constitutional: Positive for fatigue. Negative for chills, diaphoresis and fever.  HENT: Negative for congestion, rhinorrhea and sneezing.   Eyes: Negative.   Respiratory: Positive for cough. Negative for chest tightness and  shortness of breath.   Cardiovascular: Negative for chest pain and leg swelling.  Gastrointestinal: Positive for abdominal pain, nausea and vomiting. Negative for blood in stool and diarrhea.  Genitourinary: Negative for difficulty urinating, flank pain, frequency and hematuria.  Musculoskeletal: Negative for arthralgias and back pain.  Skin: Negative for rash.  Neurological: Negative for dizziness, speech difficulty, weakness, numbness and headaches.     Physical Exam Updated Vital Signs BP (!) 115/96   Pulse 87   Temp 98.3 F (36.8 C) (Oral)   Resp (!) 21   Wt 66.7 kg   SpO2 100%   BMI 20.51 kg/m   Physical Exam  Constitutional: He is oriented to person, place, and time. He appears well-developed and well-nourished.  HENT:  Head: Normocephalic and atraumatic.  Eyes: Pupils are equal, round, and reactive to light.  Neck: Normal range of motion. Neck supple.  Cardiovascular: Normal rate, regular rhythm and normal heart sounds.  Pulmonary/Chest: Effort normal and breath sounds normal. No respiratory distress. He has no wheezes. He has no rales. He exhibits no tenderness.  Abdominal: Soft. Bowel sounds are normal. There is tenderness (Mild tenderness to the left upper and lower abdomen, no rebound or guarding). There is no rebound and no guarding.  Musculoskeletal: Normal range of motion. He exhibits no edema.  Lymphadenopathy:    He has no cervical adenopathy.  Neurological: He is alert and oriented to person, place, and time.  Skin: Skin is warm and dry. No rash noted.  Psychiatric: He has a normal mood and affect.     ED Treatments / Results  Labs (all labs ordered are listed, but only abnormal results are displayed) Labs Reviewed  COMPREHENSIVE METABOLIC PANEL - Abnormal; Notable for the following components:      Result Value   Sodium 133 (*)    Chloride 89 (*)    Glucose, Bld 740 (*)    Anion gap 16 (*)    All other components within normal limits  LIPASE, BLOOD  - Abnormal; Notable for the following components:   Lipase 54 (*)    All other components within normal limits  URINALYSIS, ROUTINE W REFLEX MICROSCOPIC - Abnormal; Notable for the following components:   Color, Urine STRAW (*)    Specific Gravity, Urine 1.032 (*)    Glucose, UA >=500 (*)    Ketones, ur 20 (*)    All other components within normal limits  RAPID URINE DRUG SCREEN, HOSP PERFORMED - Abnormal; Notable for the following components:   Cocaine POSITIVE (*)    All other components within normal limits  BASIC METABOLIC PANEL - Abnormal; Notable for the following components:   Glucose, Bld 460 (*)    Calcium 8.7 (*)    All other components within normal limits  CBG MONITORING, ED - Abnormal; Notable for the following components:   Glucose-Capillary >600 (*)    All other components within normal limits  CBG MONITORING, ED - Abnormal; Notable for the following components:   Glucose-Capillary 524 (*)    All other components within normal limits  I-STAT VENOUS BLOOD GAS, ED - Abnormal; Notable for the following components:   pCO2, Ven 60.4 (*)    pO2, Ven 17.0 (*)    Bicarbonate 34.5 (*)    TCO2 36 (*)    Acid-Base Excess 7.0 (*)    All other components within normal limits  I-STAT ARTERIAL BLOOD GAS, ED - Abnormal; Notable for the following components:   pO2, Arterial 81.0 (*)    All other components within normal limits  CBG MONITORING, ED - Abnormal; Notable for the following components:   Glucose-Capillary 513 (*)    All other components within normal limits  CBG MONITORING, ED - Abnormal; Notable for the following components:   Glucose-Capillary 260 (*)    All other components within normal limits  CBC WITH DIFFERENTIAL/PLATELET  BLOOD GAS, VENOUS  BLOOD GAS, ARTERIAL    EKG EKG Interpretation  Date/Time:  Sunday August 15 2018 17:28:53 EST Ventricular Rate:  92 PR Interval:    QRS Duration: 85 QT Interval:  346 QTC Calculation: 428 R Axis:   83 Text  Interpretation:  Sinus rhythm Right atrial enlargement Borderline T wave abnormalities since last tracing no significant change Confirmed by Malvin Johns [phone]) on 08/15/2018 5:49:51 PM   Radiology Dg Abd Acute W/chest  Result Date: 08/15/2018 CLINICAL DATA:  Left-sided abdominal pain since July 22, 2018. Weakness and nausea. Vomiting beginning yesterday. Centralized chest pain. EXAM: DG ABDOMEN ACUTE W/ 1V CHEST COMPARISON:  None. FINDINGS: The heart, hila, and mediastinum are normal. No pneumothorax, pulmonary nodule, or infiltrate. No free air, portal venous gas, or pneumatosis. Moderate fecal loading in the colon. No bowel obstruction identified. A rounded calcification to the left of the L4 spinous process is noted. IMPRESSION: 1. Moderate fecal loading throughout the colon. 2. Rounded 2 mm calcification to the left of the L4 spinous process could represent bowel contents. A ureteral stone could not be excluded on this study, however. Recommend clinical correlation. If there is concern for a ureteral stone, CT imaging could better evaluate. Electronically Signed   By: Dorise Bullion III M.D   On: 08/15/2018 19:02    Procedures Procedures (including critical care time)  Medications Ordered in ED Medications  dextrose 5 %-0.45 % sodium chloride infusion ( Intravenous Not Given 08/15/18 2248)  famotidine (PEPCID) IVPB 20 mg premix (0 mg Intravenous Stopped 08/15/18 1756)  ondansetron (ZOFRAN) injection 4 mg (4 mg Intravenous Given 08/15/18 1727)  sodium chloride 0.9 % bolus 1,000 mL (0 mLs Intravenous Stopped 08/15/18 1827)  sodium chloride 0.9 % bolus 1,000 mL (0 mLs Intravenous Stopped 08/15/18 1946)  insulin aspart (novoLOG) injection 10 Units (10 Units Subcutaneous Given 08/15/18 2009)  insulin aspart (novoLOG) injection 10 Units (10 Units Subcutaneous Given 08/15/18 2211)     Initial Impression / Assessment and Plan / ED Course  I have reviewed the triage vital signs and the  nursing notes.  Pertinent labs & imaging results that were available during my care of the patient were reviewed by me and considered in my medical decision making (see chart for details).     Patient is a 57 year old male who presents with hyperglycemia.  He had a markedly elevated blood sugar and a very slight anion gap on arrival.  This corrected after treatment in the ED.  He is completely asymptomatic and is drinking without difficulty.  He has had no vomiting in the ED.  His anion gap has resolved.  His glucose has improved.  He does report that he is out of his medications.  Case management has seen the patient on a previous visit and approved feeling of his medications but he said the pharmacy did not have his medications.  I did refill his prescriptions.  He was discharged home in good condition.  Return precautions were given.  Final Clinical Impressions(s) / ED Diagnoses   Final diagnoses:  Hyperglycemia    ED Discharge Orders         Ordered    insulin detemir (LEVEMIR) 100 UNIT/ML injection     08/15/18 2255    insulin aspart (NOVOLOG) 100 UNIT/ML injection  3 times daily with meals     08/15/18 2255    amLODipine (NORVASC) 10 MG tablet  Daily    Note to Pharmacy:  This prescription was filled on 01/29/2017. Any refills authorized will be placed on file.   08/15/18 2255    hydrochlorothiazide (MICROZIDE) 12.5 MG capsule  Daily     08/15/18 2255    metFORMIN (GLUCOPHAGE) 1000 MG tablet    Note to Pharmacy:  This prescription was filled on 01/29/2017. Any refills authorized will be placed on file.   08/15/18 2255           Malvin Johns, MD 08/15/18 2258

## 2018-08-15 NOTE — ED Notes (Signed)
Critical glucose value: 740 mg/dL

## 2018-08-15 NOTE — ED Triage Notes (Signed)
Pt c/o weakness and vomiting-started yesterday- but says that he throws up every time he eats. Left sided abd pain. Denies drinking etoh, denies diarrhea.

## 2018-08-15 NOTE — ED Notes (Signed)
Pt ambulated unassisted to bathroom. Tolerated well.

## 2018-08-22 ENCOUNTER — Emergency Department (HOSPITAL_COMMUNITY): Payer: Medicaid Other

## 2018-08-22 ENCOUNTER — Inpatient Hospital Stay (HOSPITAL_COMMUNITY)
Admission: EM | Admit: 2018-08-22 | Discharge: 2018-08-25 | DRG: 638 | Disposition: A | Discharge status: Home / Self Care | Payer: Medicaid Other | Attending: Internal Medicine | Admitting: Internal Medicine

## 2018-08-22 ENCOUNTER — Encounter (HOSPITAL_COMMUNITY): Payer: Self-pay | Admitting: Emergency Medicine

## 2018-08-22 ENCOUNTER — Other Ambulatory Visit: Payer: Self-pay

## 2018-08-22 DIAGNOSIS — Z599 Problem related to housing and economic circumstances, unspecified: Secondary | ICD-10-CM

## 2018-08-22 DIAGNOSIS — R112 Nausea with vomiting, unspecified: Secondary | ICD-10-CM | POA: Diagnosis present

## 2018-08-22 DIAGNOSIS — E118 Type 2 diabetes mellitus with unspecified complications: Secondary | ICD-10-CM

## 2018-08-22 DIAGNOSIS — R9431 Abnormal electrocardiogram [ECG] [EKG]: Secondary | ICD-10-CM | POA: Diagnosis present

## 2018-08-22 DIAGNOSIS — Z9119 Patient's noncompliance with other medical treatment and regimen: Secondary | ICD-10-CM

## 2018-08-22 DIAGNOSIS — E1110 Type 2 diabetes mellitus with ketoacidosis without coma: Principal | ICD-10-CM | POA: Diagnosis present

## 2018-08-22 DIAGNOSIS — E1010 Type 1 diabetes mellitus with ketoacidosis without coma: Secondary | ICD-10-CM

## 2018-08-22 DIAGNOSIS — R109 Unspecified abdominal pain: Secondary | ICD-10-CM | POA: Diagnosis present

## 2018-08-22 DIAGNOSIS — E876 Hypokalemia: Secondary | ICD-10-CM | POA: Diagnosis present

## 2018-08-22 DIAGNOSIS — F17210 Nicotine dependence, cigarettes, uncomplicated: Secondary | ICD-10-CM | POA: Diagnosis present

## 2018-08-22 DIAGNOSIS — I110 Hypertensive heart disease with heart failure: Secondary | ICD-10-CM | POA: Diagnosis present

## 2018-08-22 DIAGNOSIS — E1165 Type 2 diabetes mellitus with hyperglycemia: Secondary | ICD-10-CM

## 2018-08-22 DIAGNOSIS — Z794 Long term (current) use of insulin: Secondary | ICD-10-CM

## 2018-08-22 DIAGNOSIS — I714 Abdominal aortic aneurysm, without rupture, unspecified: Secondary | ICD-10-CM | POA: Diagnosis present

## 2018-08-22 DIAGNOSIS — Z833 Family history of diabetes mellitus: Secondary | ICD-10-CM

## 2018-08-22 DIAGNOSIS — N179 Acute kidney failure, unspecified: Secondary | ICD-10-CM | POA: Diagnosis present

## 2018-08-22 DIAGNOSIS — Z720 Tobacco use: Secondary | ICD-10-CM

## 2018-08-22 DIAGNOSIS — F1410 Cocaine abuse, uncomplicated: Secondary | ICD-10-CM | POA: Diagnosis present

## 2018-08-22 DIAGNOSIS — E1111 Type 2 diabetes mellitus with ketoacidosis with coma: Secondary | ICD-10-CM

## 2018-08-22 DIAGNOSIS — IMO0002 Reserved for concepts with insufficient information to code with codable children: Secondary | ICD-10-CM | POA: Diagnosis present

## 2018-08-22 DIAGNOSIS — G8929 Other chronic pain: Secondary | ICD-10-CM | POA: Diagnosis present

## 2018-08-22 DIAGNOSIS — Z79899 Other long term (current) drug therapy: Secondary | ICD-10-CM

## 2018-08-22 DIAGNOSIS — I5022 Chronic systolic (congestive) heart failure: Secondary | ICD-10-CM | POA: Diagnosis present

## 2018-08-22 DIAGNOSIS — Z888 Allergy status to other drugs, medicaments and biological substances status: Secondary | ICD-10-CM

## 2018-08-22 DIAGNOSIS — E1310 Other specified diabetes mellitus with ketoacidosis without coma: Secondary | ICD-10-CM

## 2018-08-22 DIAGNOSIS — Z8249 Family history of ischemic heart disease and other diseases of the circulatory system: Secondary | ICD-10-CM

## 2018-08-22 DIAGNOSIS — I10 Essential (primary) hypertension: Secondary | ICD-10-CM

## 2018-08-22 DIAGNOSIS — M549 Dorsalgia, unspecified: Secondary | ICD-10-CM | POA: Diagnosis present

## 2018-08-22 DIAGNOSIS — N200 Calculus of kidney: Secondary | ICD-10-CM | POA: Diagnosis present

## 2018-08-22 DIAGNOSIS — I7143 Infrarenal abdominal aortic aneurysm, without rupture: Secondary | ICD-10-CM

## 2018-08-22 DIAGNOSIS — G629 Polyneuropathy, unspecified: Secondary | ICD-10-CM

## 2018-08-22 DIAGNOSIS — E785 Hyperlipidemia, unspecified: Secondary | ICD-10-CM | POA: Diagnosis present

## 2018-08-22 DIAGNOSIS — I420 Dilated cardiomyopathy: Secondary | ICD-10-CM | POA: Diagnosis present

## 2018-08-22 LAB — BASIC METABOLIC PANEL
Anion gap: 17 — ABNORMAL HIGH (ref 5–15)
BUN: 24 mg/dL — ABNORMAL HIGH (ref 6–20)
CHLORIDE: 86 mmol/L — AB (ref 98–111)
CO2: 32 mmol/L (ref 22–32)
Calcium: 8.6 mg/dL — ABNORMAL LOW (ref 8.9–10.3)
Creatinine, Ser: 1.31 mg/dL — ABNORMAL HIGH (ref 0.61–1.24)
GFR calc Af Amer: >60 mL/min (ref >60)
GFR calc non Af Amer: >60 mL/min (ref >60)
Glucose, Bld: 523 mg/dL (ref 70–99)
Potassium: 3.1 mmol/L — ABNORMAL LOW (ref 3.5–5.1)
Sodium: 135 mmol/L (ref 135–145)

## 2018-08-22 LAB — CBC WITH DIFFERENTIAL/PLATELET
Abs Immature Granulocytes: 0.01 10*3/uL (ref 0.00–0.07)
BASOS PCT: 0 %
Basophils Absolute: 0 10*3/uL (ref 0.0–0.1)
EOS ABS: 0 10*3/uL (ref 0.0–0.5)
Eosinophils Relative: 0 %
HCT: 46.7 % (ref 39.0–52.0)
HEMOGLOBIN: 14.7 g/dL (ref 13.0–17.0)
Immature Granulocytes: 0 %
LYMPHS PCT: 25 %
Lymphs Abs: 1.2 10*3/uL (ref 0.7–4.0)
MCH: 27.7 pg (ref 26.0–34.0)
MCHC: 31.5 g/dL (ref 30.0–36.0)
MCV: 88.1 fL (ref 80.0–100.0)
MONO ABS: 0.4 10*3/uL (ref 0.1–1.0)
Monocytes Relative: 9 %
NRBC: 0 % (ref 0.0–0.2)
Neutro Abs: 3 10*3/uL (ref 1.7–7.7)
Neutrophils Relative %: 66 %
PLATELETS: 328 10*3/uL (ref 150–400)
RBC: 5.3 MIL/uL (ref 4.22–5.81)
RDW: 13.2 % (ref 11.5–15.5)
WBC: 4.6 10*3/uL (ref 4.0–10.5)

## 2018-08-22 LAB — COMPREHENSIVE METABOLIC PANEL
ALT: 17 U/L (ref 0–44)
AST: 9 U/L — ABNORMAL LOW (ref 15–41)
Albumin: 3.7 g/dL (ref 3.5–5.0)
Alkaline Phosphatase: 87 U/L (ref 38–126)
Anion gap: 25 — ABNORMAL HIGH (ref 5–15)
BILIRUBIN TOTAL: 1.6 mg/dL — AB (ref 0.3–1.2)
BUN: 26 mg/dL — ABNORMAL HIGH (ref 6–20)
CO2: 31 mmol/L (ref 22–32)
CREATININE: 1.58 mg/dL — AB (ref 0.61–1.24)
Calcium: 9.4 mg/dL (ref 8.9–10.3)
Chloride: 77 mmol/L — ABNORMAL LOW (ref 98–111)
GFR, EST AFRICAN AMERICAN: 55 mL/min — AB (ref >60)
GFR, EST NON AFRICAN AMERICAN: 48 mL/min — AB (ref >60)
Glucose, Bld: 841 mg/dL (ref 70–99)
Potassium: 4.1 mmol/L (ref 3.5–5.1)
Sodium: 133 mmol/L — ABNORMAL LOW (ref 135–145)
TOTAL PROTEIN: 7.6 g/dL (ref 6.5–8.1)

## 2018-08-22 LAB — URINALYSIS, ROUTINE W REFLEX MICROSCOPIC
BACTERIA UA: NONE SEEN
Bilirubin Urine: NEGATIVE
HGB URINE DIPSTICK: NEGATIVE
KETONES UR: 80 mg/dL — AB
Leukocytes, UA: NEGATIVE
Nitrite: NEGATIVE
PH: 6 (ref 5.0–8.0)
Protein, ur: NEGATIVE mg/dL
Specific Gravity, Urine: 1.027 (ref 1.005–1.030)

## 2018-08-22 LAB — RAPID URINE DRUG SCREEN, HOSP PERFORMED
AMPHETAMINES: NOT DETECTED
Barbiturates: NOT DETECTED
Benzodiazepines: NOT DETECTED
Cocaine: POSITIVE — AB
Opiates: NOT DETECTED
Tetrahydrocannabinol: NOT DETECTED

## 2018-08-22 LAB — I-STAT CHEM 8, ED
BUN: 28 mg/dL — ABNORMAL HIGH (ref 6–20)
CALCIUM ION: 0.99 mmol/L — AB (ref 1.15–1.40)
Chloride: 81 mmol/L — ABNORMAL LOW (ref 98–111)
Creatinine, Ser: 1.1 mg/dL (ref 0.61–1.24)
HEMATOCRIT: 49 % (ref 39.0–52.0)
HEMOGLOBIN: 16.7 g/dL (ref 13.0–17.0)
Potassium: 4 mmol/L (ref 3.5–5.1)
Sodium: 131 mmol/L — ABNORMAL LOW (ref 135–145)
TCO2: 37 mmol/L — AB (ref 22–32)

## 2018-08-22 LAB — CBG MONITORING, ED
Glucose-Capillary: >600 mg/dL (ref 70–99)
Glucose-Capillary: 497 mg/dL — ABNORMAL HIGH (ref 70–99)
Glucose-Capillary: 426 mg/dL — ABNORMAL HIGH (ref 70–99)

## 2018-08-22 LAB — LIPASE, BLOOD: Lipase: 42 U/L (ref 11–51)

## 2018-08-22 LAB — BRAIN NATRIURETIC PEPTIDE: B NATRIURETIC PEPTIDE 5: 13.2 pg/mL (ref 0.0–100.0)

## 2018-08-22 LAB — BETA-HYDROXYBUTYRIC ACID: BETA-HYDROXYBUTYRIC ACID: 2.14 mmol/L — AB (ref 0.05–0.27)

## 2018-08-22 MED ORDER — ONDANSETRON HCL 4 MG/2ML IJ SOLN
4.0000 mg | Freq: Three times a day (TID) | INTRAMUSCULAR | Status: DC | PRN
  Administered 2018-08-23 – 2018-08-25 (×3): 4 mg via INTRAVENOUS
  Filled 2018-08-22 (×3): qty 2

## 2018-08-22 MED ORDER — NITROGLYCERIN 0.4 MG SL SUBL: 0.4000 mg | SUBLINGUAL_TABLET | SUBLINGUAL | Status: DC | PRN

## 2018-08-22 MED ORDER — SODIUM CHLORIDE 0.9 % IV SOLN
INTRAVENOUS | Status: DC
  Administered 2018-08-22: 21:00:00 via INTRAVENOUS

## 2018-08-22 MED ORDER — SODIUM CHLORIDE 0.9 % IV BOLUS
1000.0000 mL | Freq: Once | INTRAVENOUS | Status: AC
  Administered 2018-08-22: 1000 mL via INTRAVENOUS

## 2018-08-22 MED ORDER — ZOLPIDEM TARTRATE 5 MG PO TABS: 5.0000 mg | ORAL_TABLET | Freq: Every evening | ORAL | Status: DC | PRN

## 2018-08-22 MED ORDER — ATORVASTATIN CALCIUM 10 MG PO TABS
20.0000 mg | ORAL_TABLET | Freq: Every day | ORAL | Status: DC
  Administered 2018-08-23 – 2018-08-25 (×3): 20 mg via ORAL
  Filled 2018-08-22 (×2): qty 1
  Filled 2018-08-22 (×2): qty 2

## 2018-08-22 MED ORDER — HYDRALAZINE HCL 20 MG/ML IJ SOLN: 5.0000 mg | INTRAMUSCULAR | Status: DC | PRN

## 2018-08-22 MED ORDER — POTASSIUM CHLORIDE 10 MEQ/100ML IV SOLN
10.0000 meq | INTRAVENOUS | Status: AC
  Administered 2018-08-23 (×2): 10 meq via INTRAVENOUS
  Filled 2018-08-22 (×2): qty 100

## 2018-08-22 MED ORDER — DEXTROSE-NACL 5-0.45 % IV SOLN: INTRAVENOUS | Status: DC

## 2018-08-22 MED ORDER — SODIUM CHLORIDE 0.9 % IV SOLN: INTRAVENOUS | Status: DC

## 2018-08-22 MED ORDER — INSULIN REGULAR(HUMAN) IN NACL 100-0.9 UT/100ML-% IV SOLN
INTRAVENOUS | Status: DC
  Administered 2018-08-22: 5.4 [IU]/h via INTRAVENOUS
  Filled 2018-08-22: qty 100

## 2018-08-22 MED ORDER — METOCLOPRAMIDE HCL 5 MG/ML IJ SOLN
5.0000 mg | Freq: Three times a day (TID) | INTRAMUSCULAR | Status: DC
  Administered 2018-08-23: 5 mg via INTRAVENOUS
  Filled 2018-08-22: qty 2

## 2018-08-22 MED ORDER — DEXTROSE-NACL 5-0.45 % IV SOLN
INTRAVENOUS | Status: DC
  Administered 2018-08-23: 02:00:00 via INTRAVENOUS

## 2018-08-22 MED ORDER — ENOXAPARIN SODIUM 40 MG/0.4ML ~~LOC~~ SOLN
40.0000 mg | SUBCUTANEOUS | Status: DC
  Administered 2018-08-23 – 2018-08-24 (×3): 40 mg via SUBCUTANEOUS
  Filled 2018-08-22 (×4): qty 0.4

## 2018-08-22 MED ORDER — ACETAMINOPHEN 325 MG PO TABS: 650.0000 mg | ORAL_TABLET | Freq: Four times a day (QID) | ORAL | Status: DC | PRN

## 2018-08-22 MED ORDER — NICOTINE 21 MG/24HR TD PT24
21.0000 mg | MEDICATED_PATCH | Freq: Every day | TRANSDERMAL | Status: DC
  Administered 2018-08-23 – 2018-08-25 (×3): 21 mg via TRANSDERMAL
  Filled 2018-08-22 (×3): qty 1

## 2018-08-22 NOTE — H&P (Signed)
History and Physical    Ryan Medina DEY:[id number] DOB: [birth year] DOA: 08/22/2018  Referring MD/NP/PA:   PCP: Patient, No Pcp Per   Patient coming from:  The patient is coming from home.  At baseline, pt is independent for most of ADL.        Chief Complaint: Nausea, vomiting, abdominal pain, generalized weakness  HPI: Ryan Medina is a 57 y.o. male with medical history significant of diabetes mellitus, hypertension, hyperlipidemia, AAA, CHF with EF of 30%, tobacco abuse, cocaine abuse, chronic back pain, who presents with nausea, vomiting, abdominal pain and generalized weakness.  Patient states that he has been having nausea vomiting and abdominal pain for more than 2 days, which has worsened today.  He has vomited at least 4 times, with nonbilious and nonbloody vomitus.  No diarrhea.  He also reports abdominal pain which is located in the left side of abdomen, constant, dull, initially 9 out of 10 severity, currently 2 out of 10 severity, radiating to the left flank area.  He denies symptoms of UTI or hematuria.  Patient has  mild dry cough and mild shortness of breath, which he attributes to smoking.  Denies any chest pain, fever or chills.  No unilateral weakness.  ED Course: pt was found to have blood sugar 841, bicarbonate 31, anion gap 25, elevation of beta-hydroxybutyric acid 2.14, AKI with creatinine 1.58, BUN 26, lipase 42, tachycardia, temperature normal, no tachycardia, oxygen saturation 98% on room air.  Patient is placed on stepdown bed for observation.  # CT abdomen/pelvis showed: 3 mm of right nonobstructive stone.  Known AAA, 3 cm  Review of Systems:   General: no fevers, chills, no body weight gain, has poor appetite, has fatigue HEENT: no blurry vision, hearing changes or sore throat Respiratory: Has dyspnea, coughing, no wheezing CV: no chest pain, no palpitations GI: has nausea, vomiting, abdominal pain, no diarrhea, constipation GU: no dysuria, burning on  urination, increased urinary frequency, hematuria  Ext: no leg edema Neuro: no unilateral weakness, numbness, or tingling, no vision change or hearing loss Skin: no rash, no skin tear. MSK: No muscle spasm, no deformity, no limitation of range of movement in spin Heme: No easy bruising.  Travel history: No recent long distant travel.  Allergy:  Allergies  Allergen Reactions  . Lisinopril Anaphylaxis    Past Medical History:  Diagnosis Date  . Back pain, chronic   . Diabetes ([place])   . Dilated cardiomyopathy ([place])    Echo 2/18: EF 45-50, diff HK, Gr 1 DD, trivial AI/TR  . HLD (hyperlipidemia)   . Hypertension   . IDDM (insulin dependent diabetes mellitus) ([place])   . Ruptured lumbar disc     Past Surgical History:  Procedure Laterality Date  . ABDOMINAL SURGERY    . APPENDECTOMY    . BACK SURGERY    . CARDIAC CATHETERIZATION N/A 05/19/2016   Procedure: Left Heart Cath and Coronary Angiography;  Surgeon: Leonie Man, MD;  Location: [facility];  Service: Cardiovascular;  Laterality: N/A;    Social History:  reports that he has been smoking cigarettes. He has a 6.25 pack-year smoking history. He has never used smokeless tobacco. He reports that he does not drink alcohol or use drugs.  Family History:  Family History  Problem Relation Age of Onset  . Diabetes Mother   . Heart failure Mother   . Diabetes Sister   . Diabetes Brother      Prior to Admission medications  Medication Sig Start Date End Date Taking? Authorizing Provider  amLODipine (NORVASC) 10 MG tablet Take 1 tablet (10 mg total) by mouth daily. 08/15/18   Malvin Johns, MD  atorvastatin (LIPITOR) 20 MG tablet Take 1 tablet (20 mg total) by mouth daily. Patient not taking: Reported on 08/15/2018 07/14/18   Montine Circle, PA-C  cyclobenzaprine (FLEXERIL) 10 MG tablet Take 1 tablet (10 mg total) by mouth 2 (two) times daily as needed for muscle spasms. Patient not taking: Reported on 06/21/2018  12/07/16   Jeannett Senior, PA-C  dicyclomine (BENTYL) 20 MG tablet Take 1 tablet (20 mg total) by mouth 2 (two) times daily. Patient not taking: Reported on 06/21/2018 11/16/17   Langston Masker B, PA-C  gabapentin (NEURONTIN) 300 MG capsule Take 1 capsule (300 mg total) by mouth 3 (three) times daily. Patient not taking: Reported on 06/21/2018 12/05/16   Dorena Dew, FNP  glucose blood (AGAMATRIX PRESTO TEST) test strip Check blood sugars prior to meals and at bedtime 06/22/18   Montine Circle, PA-C  hydrochlorothiazide (MICROZIDE) 12.5 MG capsule Take 1 capsule (12.5 mg total) by mouth daily. 08/15/18 11/13/18  Malvin Johns, MD  HYDROcodone-acetaminophen (NORCO/VICODIN) 5-325 MG tablet Take 1 tablet by mouth every 6 (six) hours as needed. Patient not taking: Reported on 07/13/2018 03/17/17   Drenda Freeze, MD  insulin aspart (NOVOLOG) 100 UNIT/ML injection Inject 0-15 Units into the skin 3 (three) times daily with meals. 08/15/18   Malvin Johns, MD  insulin detemir (LEVEMIR) 100 UNIT/ML injection INJECT 30  UNITS in the morning and 20 UNITS at bedtime 08/15/18   Malvin Johns, MD  insulin NPH Human (HUMULIN N,NOVOLIN N) 100 UNIT/ML injection Inject into the skin.    [provider]  insulin regular (NOVOLIN R,HUMULIN R) 100 units/mL injection Inject into the skin.    [provider]  meloxicam (MOBIC) 15 MG tablet Take 1 tablet (15 mg total) by mouth daily. Patient not taking: Reported on 07/13/2018 06/22/18   Montine Circle, PA-C  metFORMIN (GLUCOPHAGE) 1000 MG tablet TAKE 1 TABLET BY MOUTH 2 TIMES DAILY WITH A MEAL 08/15/18   Belfi, Threasa Beards, MD  NITROSTAT 0.4 MG SL tablet DISSOLVE ONE TABLET UNDER THE TONGUE EVERY 5 MINUTES AS NEEDED FOR CHEST PAIN UP TO 3 DOSES Patient taking differently: Place 0.4 mg under the tongue every 5 (five) minutes as needed for chest pain.  01/29/17   Dorena Dew, FNP  ondansetron (ZOFRAN ODT) 4 MG disintegrating tablet Take 1  tablet (4 mg total) by mouth every 8 (eight) hours as needed for nausea or vomiting. Patient not taking: Reported on 06/21/2018 12/19/17   Gareth Morgan, MD    Physical Exam: Vitals:   08/22/18 2030 08/22/18 2045 08/22/18 2100 08/22/18 2145  BP: 101/88 111/83 (!) 106/92 (!) 128/97  Pulse: (!) 106 (!) 108 (!) 108 (!) 113  Resp:    16  Temp:      TempSrc:      SpO2: 97% 97% 100% 99%   General: Not in acute distress.  Dry mucous membrane HEENT:       Eyes: PERRL, EOMI, no scleral icterus.       ENT: No discharge from the ears and nose, no pharynx injection, no tonsillar enlargement.        Neck: No JVD, no bruit, no mass felt. Heme: No neck lymph node enlargement. Cardiac: S1/S2, RRR, No murmurs, No gallops or rubs. Respiratory: No rales, wheezing, rhonchi or rubs. GI: Soft, nondistended, mild  tenderness in left abdomen, no rebound pain, no organomegaly, BS present. GU: No hematuria Ext: No pitting leg edema bilaterally. 2+DP/PT pulse bilaterally. Musculoskeletal: No joint deformities, No joint redness or warmth, no limitation of ROM in spin. Skin: No rashes.  Neuro: Alert, oriented X3, cranial nerves II-XII grossly intact, moves all extremities normally. Psych: Patient is not psychotic, no suicidal or hemocidal ideation.  Labs on Admission: I have personally reviewed following labs and imaging studies  CBC: Recent Labs  Lab 08/22/18 1932 08/22/18 1936  WBC 4.6  --   NEUTROABS 3.0  --   HGB 14.7 16.7  HCT 46.7 49.0  MCV 88.1  --   PLT 328  --    Basic Metabolic Panel: Recent Labs  Lab 08/22/18 1932 08/22/18 1936  NA 133* 131*  K 4.1 4.0  CL 77* 81*  CO2 31  --   GLUCOSE 841* >700*  BUN 26* 28*  CREATININE 1.58* 1.10  CALCIUM 9.4  --    GFR: Estimated Creatinine Clearance: 69.9 mL/min (by C-G formula based on SCr of 1.1 mg/dL). Liver Function Tests: Recent Labs  Lab 08/22/18 1932  AST 9*  ALT 17  ALKPHOS 87  BILITOT 1.6*  PROT 7.6  ALBUMIN 3.7    Recent Labs  Lab 08/22/18 1932  LIPASE 42   No results for input(s): AMMONIA in the last 168 hours. Coagulation Profile: No results for input(s): INR, PROTIME in the last 168 hours. Cardiac Enzymes: No results for input(s): CKTOTAL, CKMB, CKMBINDEX, TROPONINI in the last 168 hours. BNP (last 3 results) No results for input(s): PROBNP in the last 8760 hours. HbA1C: No results for input(s): HGBA1C in the last 72 hours. CBG: Recent Labs  Lab 08/15/18 2249 08/22/18 1952 08/22/18 2209  GLUCAP 260* >600* 497*   Lipid Profile: No results for input(s): CHOL, HDL, LDLCALC, TRIG, CHOLHDL, LDLDIRECT in the last 72 hours. Thyroid Function Tests: No results for input(s): TSH, T4TOTAL, FREET4, T3FREE, THYROIDAB in the last 72 hours. Anemia Panel: No results for input(s): VITAMINB12, FOLATE, FERRITIN, TIBC, IRON, RETICCTPCT in the last 72 hours. Urine analysis:    Component Value Date/Time   COLORURINE STRAW (A) 08/22/2018 2054   APPEARANCEUR CLEAR 08/22/2018 2054   LABSPEC 1.027 08/22/2018 2054   PHURINE 6.0 08/22/2018 2054   GLUCOSEU >=500 (A) 08/22/2018 2054   HGBUR NEGATIVE 08/22/2018 2054   [place] NEGATIVE 08/22/2018 2054   KETONESUR 80 (A) 08/22/2018 2054   PROTEINUR NEGATIVE 08/22/2018 2054   UROBILINOGEN 4.0 (H) 12/05/2016 0921   NITRITE NEGATIVE 08/22/2018 2054   LEUKOCYTESUR NEGATIVE 08/22/2018 2054   Sepsis Labs: @LABRCNTIP (procalcitonin:4,lacticidven:4) )No results found for this or any previous visit (from the past 240 hour(s)).   Radiological Exams on Admission: Ct Renal Stone Study  Result Date: 08/22/2018 CLINICAL DATA:  Left flank pain. Nausea vomiting for 4 days. Previous appendectomy. EXAM: CT ABDOMEN AND PELVIS WITHOUT CONTRAST TECHNIQUE: Multidetector CT imaging of the abdomen and pelvis was performed following the standard protocol without IV contrast. COMPARISON:  CT scan December 19, 2017 FINDINGS: Lower chest: No acute abnormality. Hepatobiliary: No  focal liver abnormality is seen. No gallstones, gallbladder wall thickening, or biliary dilatation. Pancreas: Unremarkable. No pancreatic ductal dilatation or surrounding inflammatory changes. Spleen: Normal in size without focal abnormality. Adrenals/Urinary Tract: Adrenal glands are normal. A 3 mm stone is again noted in the mid right kidney. No hydronephrosis or perinephric stranding. Bilateral extrarenal pelvises. No ureterectasis or ureteral stones noted. The bladder is unremarkable. Stomach/Bowel: The stomach is  distended. Small bowel is normal. Moderate to severe fecal loading throughout the colon with a stool ball in the rectum. By report, the patient is status post appendectomy. 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 Atherosclerotic changes seen in the aorta, iliac vessels, and femoral vessels. Reproductive: Prostate is unremarkable. Other: No abdominal wall hernia or abnormality. No abdominopelvic ascites. Musculoskeletal: No acute or significant osseous findings. IMPRESSION: 1. Nonobstructive 3 mm stone in the right kidney. No renal obstruction or other stones noted. 2. Gastric distention of uncertain etiology. 3. Moderate to severe fecal loading throughout the colon. 4. Atherosclerotic changes throughout the abdominal aorta and iliac vessels. 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 Recommend followup by ultrasound in 3 years. This recommendation follows ACR consensus guidelines: White Paper of the ACR Incidental Findings Committee II on Vascular Findings. Natasha Mead Coll Radiol 2013; [phone] Electronically Signed   By: Dorise Bullion III M.D   On: 08/22/2018 19:54     EKG: Sinus  rhythm, QTC 467, diffuse T wave inversion  Assessment/Plan Principal Problem:   DKA (diabetic ketoacidoses) ([facility]) Active Problems:   DM (diabetes mellitus), type 2, uncontrolled ([place])   Essential hypertension   AAA (abdominal aortic aneurysm) ([facility])   Tobacco abuse   Nausea & vomiting   HLD (hyperlipidemia)   AKI (acute kidney injury) ([place])   Abdominal pain   DKA (diabetic ketoacidoses) ([place]): blood sugar 841, bicarbonate 31, anion gap 25, elevation of beta-hydroxybutyric acid 2.14. -- place on stepdown for obs - 2L of NS bolus - start DKA protocol with BMP q4h - IVF: NS 125 cc/h; will switch to D5-1/2NS when CBG<250 - replete K as needed - Zofran prn nausea  - NPO   DM (diabetes mellitus), type 2, uncontrolled ([place]): Last A1c>15.5 on 02/07/18 , poorly controled. Patient is taking NPH insulin, metformin at home -On DKA protocol currently  Essential hypertension: Blood pressure 104/75.  Patient is not taking medications at home. -IV hydralazine.  AAA (abdominal aortic aneurysm) [facility]): CT scan showed 3 cm AAA. -Follow-up with PCP  Tobacco abuse: -Did counseling about importance of quitting smoking -Nicotine patch  HLD: Not taking his Lipitor currently -restarted lipitor -check LDL  Nausea & vomiting and abdominal pain: Etiology is not clear.  CT abdomen/pelvis showed small nonobstructive right kidney stone, otherwise not impressive.  Differential diagnosis include viral gastritis, gastroparesis. -PRN Zofran for nausea -IV fluid as above -PRN morphine for pain  AKI: Likely due to dehydration secondary to nausea vomiting.  Patient is not taking his HCTZ and the Mobic at home.  - IVF as above - Follow up renal function by BMP  Abnormal EKG: EKG showed diffuse T wave inversion.  Patient does not have chest pain. -Will check troponin x3, A1c, FLP, UDS -restart lipitor -Prn NTG   DVT ppx: SQ Lovenox Code Status: Full code Family Communication: None at bed side.        Disposition Plan:  Anticipate discharge back to previous home environment Consults called:  none Admission status:  SDU/obs  Date of Service 08/22/2018    Ivor Costa [facility] Pager [phone]  If 7PM-7AM, please contact night-coverage [email or web address] Password [email or web address] 08/22/2018, 10:35 PM

## 2018-08-22 NOTE — ED Provider Notes (Addendum)
[facility] EMERGENCY DEPARTMENT Provider Note   CSN: [id number] Arrival date & time: 08/22/18  1830     History   Chief Complaint Chief Complaint  Patient presents with  . Emesis    HPI Ryan Medina is a 57 y.o. male.  Patient is a 57 year old male who presents with vomiting.  He has a history of diabetes, hypertension and abdominal aneurysm.  He states that he has been vomiting since November 28.  He has not really been able to keep much down.  He was seen here on the 24th with hyperglycemia and this resolved with treatment in the ED.  There is no suggestions of DKA.  He got refills on his medications and states that he has been taking his medications as prescribed.  He denies any significant polyuria or polydipsia this time.  No fevers.  He does have some worsening pain in his left abdomen.  No urinary symptoms.  No diarrhea.     Past Medical History:  Diagnosis Date  . Back pain, chronic   . Diabetes ([place])   . Dilated cardiomyopathy ([place])    Echo 2/18: EF 45-50, diff HK, Gr 1 DD, trivial AI/TR  . HLD (hyperlipidemia)   . Hypertension   . IDDM (insulin dependent diabetes mellitus) ([place])   . Ruptured lumbar disc     Patient Active Problem List   Diagnosis Date Noted  . HLD (hyperlipidemia) 08/22/2018  . DKA (diabetic ketoacidoses) ([place]) 08/22/2018  . AKI (acute kidney injury) ([place]) 08/22/2018  . Abdominal pain 08/22/2018  . Hyperosmolar non-ketotic state in patient with type 2 diabetes mellitus ([place]) 01/30/2018  . Chest pain 01/30/2018  . AAA (abdominal aortic aneurysm) ([place]) 01/30/2018  . Tobacco abuse 01/30/2018  . Nausea & vomiting 01/30/2018  . Diabetes mellitus due to underlying condition with hyperosmolarity without nonketotic hyperglycemic-hyperosmolar coma [facility]) ([place]) 01/30/2018  . Dilated cardiomyopathy ([place])   . Other hyperlipidemia 11/11/2016  . Abnormal nuclear stress test 05/19/2016  . Atypical angina ([place]) 04/25/2016  . DM  (diabetes mellitus), type 2, uncontrolled ([place]) 03/14/2015  . Essential hypertension 03/14/2015  . Hyperglycemia 01/14/2015    Past Surgical History:  Procedure Laterality Date  . ABDOMINAL SURGERY    . APPENDECTOMY    . BACK SURGERY    . CARDIAC CATHETERIZATION N/A 05/19/2016   Procedure: Left Heart Cath and Coronary Angiography;  Surgeon: Leonie Man, MD;  Location: [facility];  Service: Cardiovascular;  Laterality: N/A;        Home Medications    Prior to Admission medications   Medication Sig Start Date End Date Taking? Authorizing Provider  amLODipine (NORVASC) 10 MG tablet Take 1 tablet (10 mg total) by mouth daily. 08/15/18  Yes Malvin Johns, MD  hydrochlorothiazide (MICROZIDE) 12.5 MG capsule Take 1 capsule (12.5 mg total) by mouth daily. 08/15/18 11/13/18 Yes Malvin Johns, MD  insulin NPH Human (HUMULIN N,NOVOLIN N) 100 UNIT/ML injection Inject into the skin.   Yes [provider]  insulin regular (NOVOLIN R,HUMULIN R) 100 units/mL injection Inject into the skin.   Yes [provider]  metFORMIN (GLUCOPHAGE) 1000 MG tablet TAKE 1 TABLET BY MOUTH 2 TIMES DAILY WITH A MEAL Patient taking differently: Take 1,000 mg by mouth 2 (two) times daily with a meal.  08/15/18  Yes Danetta Prom, MD  NITROSTAT 0.4 MG SL tablet DISSOLVE ONE TABLET UNDER THE TONGUE EVERY 5 MINUTES AS NEEDED FOR CHEST PAIN UP TO 3 DOSES Patient taking differently:  Place 0.4 mg under the tongue every 5 (five) minutes as needed for chest pain.  01/29/17  Yes Dorena Dew, FNP  atorvastatin (LIPITOR) 20 MG tablet Take 1 tablet (20 mg total) by mouth daily. Patient not taking: Reported on 08/22/2018 07/14/18   Montine Circle, PA-C  cyclobenzaprine (FLEXERIL) 10 MG tablet Take 1 tablet (10 mg total) by mouth 2 (two) times daily as needed for muscle spasms. Patient not taking: Reported on 08/22/2018 12/07/16   Jeannett Senior, PA-C  dicyclomine (BENTYL) 20 MG tablet Take 1  tablet (20 mg total) by mouth 2 (two) times daily. Patient not taking: Reported on 08/22/2018 11/16/17   Langston Masker B, PA-C  gabapentin (NEURONTIN) 300 MG capsule Take 1 capsule (300 mg total) by mouth 3 (three) times daily. Patient not taking: Reported on 08/22/2018 12/05/16   Dorena Dew, FNP  glucose blood (AGAMATRIX PRESTO TEST) test strip Check blood sugars prior to meals and at bedtime 06/22/18   Montine Circle, PA-C  HYDROcodone-acetaminophen (NORCO/VICODIN) 5-325 MG tablet Take 1 tablet by mouth every 6 (six) hours as needed. Patient not taking: Reported on 08/22/2018 03/17/17   Drenda Freeze, MD  insulin aspart (NOVOLOG) 100 UNIT/ML injection Inject 0-15 Units into the skin 3 (three) times daily with meals. Patient not taking: Reported on 08/22/2018 08/15/18   Malvin Johns, MD  insulin detemir (LEVEMIR) 100 UNIT/ML injection INJECT 30  UNITS in the morning and 20 UNITS at bedtime Patient not taking: Reported on 08/22/2018 08/15/18   Malvin Johns, MD  meloxicam (MOBIC) 15 MG tablet Take 1 tablet (15 mg total) by mouth daily. Patient not taking: Reported on 08/22/2018 06/22/18   Montine Circle, PA-C  ondansetron (ZOFRAN ODT) 4 MG disintegrating tablet Take 1 tablet (4 mg total) by mouth every 8 (eight) hours as needed for nausea or vomiting. Patient not taking: Reported on 08/22/2018 12/19/17   Gareth Morgan, MD    Family History Family History  Problem Relation Age of Onset  . Diabetes Mother   . Heart failure Mother   . Diabetes Sister   . Diabetes Brother     Social History Social History   Tobacco Use  . Smoking status: Current Every Day Smoker    Packs/day: 0.25    Years: 25.00    Pack years: 6.25    Types: Cigarettes  . Smokeless tobacco: Never Used  . Tobacco comment: 2 per day   Substance Use Topics  . Alcohol use: No    Frequency: Never  . Drug use: No     Allergies   Lisinopril   Review of Systems Review of Systems  Constitutional:  Positive for fatigue. Negative for chills, diaphoresis and fever.  HENT: Negative for congestion, rhinorrhea and sneezing.   Eyes: Negative.   Respiratory: Negative for cough, chest tightness and shortness of breath.   Cardiovascular: Negative for chest pain and leg swelling.  Gastrointestinal: Positive for abdominal pain, nausea and vomiting. Negative for blood in stool and diarrhea.  Genitourinary: Negative for difficulty urinating, flank pain, frequency and hematuria.  Musculoskeletal: Negative for arthralgias and back pain.  Skin: Negative for rash.  Neurological: Negative for dizziness, speech difficulty, weakness, numbness and headaches.     Physical Exam Updated Vital Signs BP 112/77   Pulse 98   Temp 98 F (36.7 C) (Oral)   Resp 16   SpO2 98%   Physical Exam  Constitutional: He is oriented to person, place, and time. He appears well-developed and well-nourished.  HENT:  Head: Normocephalic and atraumatic.  Eyes: Pupils are equal, round, and reactive to light.  Neck: Normal range of motion. Neck supple.  Cardiovascular: Normal rate, regular rhythm and normal heart sounds.  Pulmonary/Chest: Effort normal and breath sounds normal. No respiratory distress. He has no wheezes. He has no rales. He exhibits no tenderness.  Abdominal: Soft. Bowel sounds are normal. There is tenderness (Moderate tenderness to the left upper and lower abdomen). There is no rebound and no guarding.  Musculoskeletal: Normal range of motion. He exhibits no edema.  Lymphadenopathy:    He has no cervical adenopathy.  Neurological: He is alert and oriented to person, place, and time.  Skin: Skin is warm and dry. No rash noted.  Psychiatric: He has a normal mood and affect.     ED Treatments / Results  Labs (all labs ordered are listed, but only abnormal results are displayed) Labs Reviewed  COMPREHENSIVE METABOLIC PANEL - Abnormal; Notable for the following components:      Result Value   Sodium  133 (*)    Chloride 77 (*)    Glucose, Bld 841 (*)    BUN 26 (*)    Creatinine, Ser 1.58 (*)    AST 9 (*)    Total Bilirubin 1.6 (*)    GFR calc non Af Amer 48 (*)    GFR calc Af Amer 55 (*)    Anion gap 25 (*)    All other components within normal limits  URINALYSIS, ROUTINE W REFLEX MICROSCOPIC - Abnormal; Notable for the following components:   Color, Urine STRAW (*)    Glucose, UA >=500 (*)    Ketones, ur 80 (*)    All other components within normal limits  BETA-HYDROXYBUTYRIC ACID - Abnormal; Notable for the following components:   Beta-Hydroxybutyric Acid 2.14 (*)    All other components within normal limits  I-STAT CHEM 8, ED - Abnormal; Notable for the following components:   Sodium 131 (*)    Chloride 81 (*)    BUN 28 (*)    Glucose, Bld >700 (*)    Calcium, Ion 0.99 (*)    TCO2 37 (*)    All other components within normal limits  CBG MONITORING, ED - Abnormal; Notable for the following components:   Glucose-Capillary >600 (*)    All other components within normal limits  CBG MONITORING, ED - Abnormal; Notable for the following components:   Glucose-Capillary 497 (*)    All other components within normal limits  CBG MONITORING, ED - Abnormal; Notable for the following components:   Glucose-Capillary 426 (*)    All other components within normal limits  CBC WITH DIFFERENTIAL/PLATELET  LIPASE, BLOOD  BRAIN NATRIURETIC PEPTIDE  RAPID URINE DRUG SCREEN, HOSP PERFORMED  BASIC METABOLIC PANEL  BASIC METABOLIC PANEL  BASIC METABOLIC PANEL  BASIC METABOLIC PANEL    EKG EKG Interpretation  Date/Time:  Sunday August 22 2018 22:57:59 EST Ventricular Rate:  94 PR Interval:    QRS Duration: 93 QT Interval:  373 QTC Calculation: 467 R Axis:   79 Text Interpretation:  Sinus rhythm LAE, consider biatrial enlargement Abnormal T, consider ischemia, diffuse leads changed from prior EKG Confirmed by Malvin Johns ([id number]) on 08/22/2018 11:37:21 PM   Radiology Ct  Renal Stone Study  Result Date: 08/22/2018 CLINICAL DATA:  Left flank pain. Nausea vomiting for 4 days. Previous appendectomy. EXAM: CT ABDOMEN AND PELVIS WITHOUT CONTRAST TECHNIQUE: Multidetector CT imaging of the abdomen and pelvis was performed following the standard  protocol without IV contrast. COMPARISON:  CT scan December 19, 2017 FINDINGS: Lower chest: No acute abnormality. Hepatobiliary: No focal liver abnormality is seen. No gallstones, gallbladder wall thickening, or biliary dilatation. Pancreas: Unremarkable. No pancreatic ductal dilatation or surrounding inflammatory changes. Spleen: Normal in size without focal abnormality. Adrenals/Urinary Tract: Adrenal glands are normal. A 3 mm stone is again noted in the mid right kidney. No hydronephrosis or perinephric stranding. Bilateral extrarenal pelvises. No ureterectasis or ureteral stones noted. The bladder is unremarkable. Stomach/Bowel: The stomach is distended. Small bowel is normal. Moderate to severe fecal loading throughout the colon with a stool ball in the rectum. By report, the patient is status post appendectomy. 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 Atherosclerotic changes seen in the aorta, iliac vessels, and femoral vessels. Reproductive: Prostate is unremarkable. Other: No abdominal wall hernia or abnormality. No abdominopelvic ascites. Musculoskeletal: No acute or significant osseous findings. IMPRESSION: 1. Nonobstructive 3 mm stone in the right kidney. No renal obstruction or other stones noted. 2. Gastric distention of uncertain etiology. 3. Moderate to severe fecal loading throughout the colon. 4. Atherosclerotic changes throughout the abdominal aorta and iliac vessels. Aneurysmal dilatation of the distal abdominal  aorta to 3 cm, unchanged since May of 2019. 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 Recommend followup by ultrasound in 3 years. This recommendation follows ACR consensus guidelines: White Paper of the ACR Incidental Findings Committee II on Vascular Findings. [facility] Radiol 2013; [phone] Electronically Signed   By: Dorise Bullion III M.D   On: 08/22/2018 19:54    Procedures Procedures (including critical care time)  Medications Ordered in ED Medications  dextrose 5 %-0.45 % sodium chloride infusion ( Intravenous Hold 08/22/18 2028)  insulin regular, human (MYXREDLIN) 100 units/ 100 mL infusion (7.3 Units/hr Intravenous Rate/Dose Change 08/22/18 2317)  sodium chloride 0.9 % bolus 1,000 mL (0 mLs Intravenous Stopped 08/22/18 2248)    And  0.9 %  sodium chloride infusion ( Intravenous New Bag/Given 08/22/18 2053)  atorvastatin (LIPITOR) tablet 20 mg (has no administration in time range)  nitroGLYCERIN (NITROSTAT) SL tablet 0.4 mg (has no administration in time range)  metoCLOPramide (REGLAN) injection 5 mg (has no administration in time range)  ondansetron (ZOFRAN) injection 4 mg (has no administration in time range)  nicotine (NICODERM CQ - dosed in mg/24 hours) patch 21 mg (has no administration in time range)  0.9 %  sodium chloride infusion ( Intravenous See Procedure Record 08/22/18 2215)  dextrose 5 %-0.45 % sodium chloride infusion ( Intravenous Hold 08/22/18 2215)  enoxaparin (LOVENOX) injection 40 mg (has no administration in time range)  potassium chloride 10 mEq in 100 mL IVPB (has no administration in time range)  hydrALAZINE (APRESOLINE) injection 5 mg (has no administration in time range)  acetaminophen (TYLENOL) tablet 650 mg (has no administration in time range)  zolpidem (AMBIEN) tablet 5 mg (has no administration in time range)  sodium chloride 0.9 % bolus 1,000 mL (0 mLs Intravenous Stopped 08/22/18 2158)     Initial Impression / Assessment and Plan / ED  Course  I have reviewed the triage vital signs and the nursing notes.  Pertinent labs & imaging results that were available during my care of the patient were reviewed by me and considered in my  medical decision making (see chart for details).     Patient is a 57 year old male who presents with nausea and vomiting.  He smells of ketones.  His glucose is over 700.  He has an anion gap.  He was started on IV fluids and glucose stabilizer.  His heart rate is improving.  He is alert and answering questions appropriately.  He did have some worsening abdominal pain and a CT scan was performed which shows no evidence of acute abnormality.  I spoke with Dr. Blaine Hamper who will admit the patient for further treatment.  CRITICAL CARE Performed by: Malvin Johns Total critical care time: 45 minutes Critical care time was exclusive of separately billable procedures and treating other patients. Critical care was necessary to treat or prevent imminent or life-threatening deterioration. Critical care was time spent personally by me on the following activities: development of treatment plan with patient and/or surrogate as well as nursing, discussions with consultants, evaluation of patient's response to treatment, examination of patient, obtaining history from patient or surrogate, ordering and performing treatments and interventions, ordering and review of laboratory studies, ordering and review of radiographic studies, pulse oximetry and re-evaluation of patient's condition.   Final Clinical Impressions(s) / ED Diagnoses   Final diagnoses:  Diabetic ketoacidosis without coma associated with other specified diabetes mellitus [facility])    ED Discharge Orders    None       Malvin Johns, MD 08/22/18 2100    Malvin Johns, MD 08/22/18 2337

## 2018-08-22 NOTE — ED Notes (Signed)
Patient transported to CT 

## 2018-08-22 NOTE — ED Triage Notes (Signed)
Pt states since thanksgiving dinner he has been vomiting every time he attempts to eat. Pt reports vomiting 4 times in last 24 hours. No diarrhea.

## 2018-08-23 ENCOUNTER — Encounter (HOSPITAL_COMMUNITY): Payer: Self-pay

## 2018-08-23 DIAGNOSIS — M549 Dorsalgia, unspecified: Secondary | ICD-10-CM | POA: Diagnosis present

## 2018-08-23 DIAGNOSIS — Z794 Long term (current) use of insulin: Secondary | ICD-10-CM | POA: Diagnosis not present

## 2018-08-23 DIAGNOSIS — Z888 Allergy status to other drugs, medicaments and biological substances status: Secondary | ICD-10-CM | POA: Diagnosis not present

## 2018-08-23 DIAGNOSIS — Z833 Family history of diabetes mellitus: Secondary | ICD-10-CM | POA: Diagnosis not present

## 2018-08-23 DIAGNOSIS — Z599 Problem related to housing and economic circumstances, unspecified: Secondary | ICD-10-CM | POA: Diagnosis not present

## 2018-08-23 DIAGNOSIS — F1410 Cocaine abuse, uncomplicated: Secondary | ICD-10-CM | POA: Diagnosis present

## 2018-08-23 DIAGNOSIS — E1110 Type 2 diabetes mellitus with ketoacidosis without coma: Secondary | ICD-10-CM | POA: Diagnosis present

## 2018-08-23 DIAGNOSIS — I5022 Chronic systolic (congestive) heart failure: Secondary | ICD-10-CM | POA: Diagnosis present

## 2018-08-23 DIAGNOSIS — Z8249 Family history of ischemic heart disease and other diseases of the circulatory system: Secondary | ICD-10-CM | POA: Diagnosis not present

## 2018-08-23 DIAGNOSIS — E1310 Other specified diabetes mellitus with ketoacidosis without coma: Secondary | ICD-10-CM | POA: Diagnosis present

## 2018-08-23 DIAGNOSIS — I420 Dilated cardiomyopathy: Secondary | ICD-10-CM | POA: Diagnosis present

## 2018-08-23 DIAGNOSIS — G8929 Other chronic pain: Secondary | ICD-10-CM | POA: Diagnosis present

## 2018-08-23 DIAGNOSIS — R1084 Generalized abdominal pain: Secondary | ICD-10-CM

## 2018-08-23 DIAGNOSIS — F17210 Nicotine dependence, cigarettes, uncomplicated: Secondary | ICD-10-CM | POA: Diagnosis present

## 2018-08-23 DIAGNOSIS — Z9119 Patient's noncompliance with other medical treatment and regimen: Secondary | ICD-10-CM | POA: Diagnosis not present

## 2018-08-23 DIAGNOSIS — R9431 Abnormal electrocardiogram [ECG] [EKG]: Secondary | ICD-10-CM | POA: Diagnosis present

## 2018-08-23 DIAGNOSIS — I714 Abdominal aortic aneurysm, without rupture: Secondary | ICD-10-CM | POA: Diagnosis present

## 2018-08-23 DIAGNOSIS — Z79899 Other long term (current) drug therapy: Secondary | ICD-10-CM | POA: Diagnosis not present

## 2018-08-23 DIAGNOSIS — E876 Hypokalemia: Secondary | ICD-10-CM | POA: Diagnosis present

## 2018-08-23 DIAGNOSIS — N200 Calculus of kidney: Secondary | ICD-10-CM | POA: Diagnosis present

## 2018-08-23 DIAGNOSIS — N179 Acute kidney failure, unspecified: Secondary | ICD-10-CM | POA: Diagnosis present

## 2018-08-23 DIAGNOSIS — I110 Hypertensive heart disease with heart failure: Secondary | ICD-10-CM | POA: Diagnosis present

## 2018-08-23 DIAGNOSIS — E785 Hyperlipidemia, unspecified: Secondary | ICD-10-CM | POA: Diagnosis present

## 2018-08-23 LAB — BASIC METABOLIC PANEL
Anion gap: 17 — ABNORMAL HIGH (ref 5–15)
Anion gap: 11 (ref 5–15)
Anion gap: 10 (ref 5–15)
BUN: 22 mg/dL — ABNORMAL HIGH (ref 6–20)
BUN: 20 mg/dL (ref 6–20)
BUN: 26 mg/dL — ABNORMAL HIGH (ref 6–20)
CALCIUM: 8 mg/dL — AB (ref 8.9–10.3)
CO2: 28 mmol/L (ref 22–32)
CO2: 31 mmol/L (ref 22–32)
CO2: 27 mmol/L (ref 22–32)
Calcium: 8.5 mg/dL — ABNORMAL LOW (ref 8.9–10.3)
Calcium: 8.4 mg/dL — ABNORMAL LOW (ref 8.9–10.3)
Chloride: 93 mmol/L — ABNORMAL LOW (ref 98–111)
Chloride: 98 mmol/L (ref 98–111)
Chloride: 101 mmol/L (ref 98–111)
Creatinine, Ser: 1.07 mg/dL (ref 0.61–1.24)
Creatinine, Ser: 0.93 mg/dL (ref 0.61–1.24)
Creatinine, Ser: 0.85 mg/dL (ref 0.61–1.24)
GFR calc Af Amer: >60 mL/min (ref >60)
GFR calc non Af Amer: >60 mL/min (ref >60)
GFR calc non Af Amer: >60 mL/min (ref >60)
GFR calc non Af Amer: >60 mL/min (ref >60)
Glucose, Bld: 177 mg/dL — ABNORMAL HIGH (ref 70–99)
Glucose, Bld: 122 mg/dL — ABNORMAL HIGH (ref 70–99)
Glucose, Bld: 167 mg/dL — ABNORMAL HIGH (ref 70–99)
Potassium: 3.2 mmol/L — ABNORMAL LOW (ref 3.5–5.1)
Potassium: 3.3 mmol/L — ABNORMAL LOW (ref 3.5–5.1)
Potassium: 3.3 mmol/L — ABNORMAL LOW (ref 3.5–5.1)
Sodium: 138 mmol/L (ref 135–145)
Sodium: 140 mmol/L (ref 135–145)
Sodium: 138 mmol/L (ref 135–145)

## 2018-08-23 LAB — LIPID PANEL
Cholesterol: 216 mg/dL — ABNORMAL HIGH (ref 0–200)
HDL: 34 mg/dL — ABNORMAL LOW (ref >40)
LDL Cholesterol: 167 mg/dL — ABNORMAL HIGH (ref 0–99)
TRIGLYCERIDES: 73 mg/dL (ref <150)
Total CHOL/HDL Ratio: 6.4 RATIO
VLDL: 15 mg/dL (ref 0–40)

## 2018-08-23 LAB — GLUCOSE, CAPILLARY
Glucose-Capillary: 129 mg/dL — ABNORMAL HIGH (ref 70–99)
Glucose-Capillary: 265 mg/dL — ABNORMAL HIGH (ref 70–99)

## 2018-08-23 LAB — CBG MONITORING, ED
GLUCOSE-CAPILLARY: 112 mg/dL — AB (ref 70–99)
Glucose-Capillary: 105 mg/dL — ABNORMAL HIGH (ref 70–99)
Glucose-Capillary: 129 mg/dL — ABNORMAL HIGH (ref 70–99)
Glucose-Capillary: 134 mg/dL — ABNORMAL HIGH (ref 70–99)
Glucose-Capillary: 136 mg/dL — ABNORMAL HIGH (ref 70–99)
Glucose-Capillary: 140 mg/dL — ABNORMAL HIGH (ref 70–99)
Glucose-Capillary: 140 mg/dL — ABNORMAL HIGH (ref 70–99)
Glucose-Capillary: 146 mg/dL — ABNORMAL HIGH (ref 70–99)
Glucose-Capillary: 155 mg/dL — ABNORMAL HIGH (ref 70–99)
Glucose-Capillary: 166 mg/dL — ABNORMAL HIGH (ref 70–99)
Glucose-Capillary: 176 mg/dL — ABNORMAL HIGH (ref 70–99)
Glucose-Capillary: 184 mg/dL — ABNORMAL HIGH (ref 70–99)
Glucose-Capillary: 211 mg/dL — ABNORMAL HIGH (ref 70–99)
Glucose-Capillary: 263 mg/dL — ABNORMAL HIGH (ref 70–99)

## 2018-08-23 LAB — TROPONIN I: Troponin I: <0.03 ng/mL (ref <0.03)

## 2018-08-23 LAB — HEMOGLOBIN A1C
Hgb A1c MFr Bld: 18.2 % — ABNORMAL HIGH (ref 4.8–5.6)
Mean Plasma Glucose: 475.64 mg/dL

## 2018-08-23 LAB — MRSA PCR SCREENING: MRSA by PCR: NEGATIVE

## 2018-08-23 MED ORDER — TRAMADOL HCL 50 MG PO TABS
50.0000 mg | ORAL_TABLET | Freq: Four times a day (QID) | ORAL | Status: DC | PRN
  Administered 2018-08-24: 50 mg via ORAL
  Filled 2018-08-23: qty 1

## 2018-08-23 MED ORDER — SODIUM CHLORIDE 0.9 % IV SOLN
INTRAVENOUS | Status: DC
  Administered 2018-08-23 – 2018-08-24 (×2): via INTRAVENOUS

## 2018-08-23 MED ORDER — INSULIN ASPART 100 UNIT/ML ~~LOC~~ SOLN
0.0000 [IU] | Freq: Three times a day (TID) | SUBCUTANEOUS | Status: DC
  Administered 2018-08-23: 2 [IU] via SUBCUTANEOUS
  Administered 2018-08-24 (×2): 3 [IU] via SUBCUTANEOUS
  Administered 2018-08-24: 11 [IU] via SUBCUTANEOUS
  Administered 2018-08-25: 3 [IU] via SUBCUTANEOUS

## 2018-08-23 MED ORDER — INSULIN DETEMIR 100 UNIT/ML ~~LOC~~ SOLN
20.0000 [IU] | Freq: Once | SUBCUTANEOUS | Status: AC
  Administered 2018-08-23: 20 [IU] via SUBCUTANEOUS
  Filled 2018-08-23 (×4): qty 0.2

## 2018-08-23 MED ORDER — OXYCODONE HCL 5 MG PO TABS: 5.0000 mg | ORAL_TABLET | ORAL | Status: DC | PRN

## 2018-08-23 MED ORDER — SENNOSIDES-DOCUSATE SODIUM 8.6-50 MG PO TABS
1.0000 | ORAL_TABLET | Freq: Two times a day (BID) | ORAL | Status: DC
  Filled 2018-08-23 (×4): qty 1

## 2018-08-23 MED ORDER — POTASSIUM CHLORIDE CRYS ER 20 MEQ PO TBCR
40.0000 meq | EXTENDED_RELEASE_TABLET | Freq: Once | ORAL | Status: AC
  Administered 2018-08-23: 40 meq via ORAL

## 2018-08-23 MED ORDER — ACETAMINOPHEN 325 MG PO TABS: 650.0000 mg | ORAL_TABLET | Freq: Four times a day (QID) | ORAL | Status: DC | PRN

## 2018-08-23 MED ORDER — INSULIN ASPART 100 UNIT/ML ~~LOC~~ SOLN
0.0000 [IU] | Freq: Every day | SUBCUTANEOUS | Status: DC
  Administered 2018-08-23: 3 [IU] via SUBCUTANEOUS
  Administered 2018-08-24: 2 [IU] via SUBCUTANEOUS

## 2018-08-23 MED ORDER — INSULIN DETEMIR 100 UNIT/ML ~~LOC~~ SOLN
20.0000 [IU] | Freq: Two times a day (BID) | SUBCUTANEOUS | Status: DC
  Administered 2018-08-24: 20 [IU] via SUBCUTANEOUS
  Filled 2018-08-23 (×2): qty 0.2

## 2018-08-23 MED ORDER — PNEUMOCOCCAL VAC POLYVALENT 25 MCG/0.5ML IJ INJ
0.5000 mL | INJECTION | INTRAMUSCULAR | Status: DC
  Filled 2018-08-23: qty 0.5

## 2018-08-23 MED ORDER — SODIUM CHLORIDE 0.9 % IV SOLN: INTRAVENOUS | Status: DC

## 2018-08-23 MED ORDER — MORPHINE SULFATE (PF) 2 MG/ML IV SOLN: 2.0000 mg | INTRAVENOUS | Status: DC | PRN

## 2018-08-23 MED ORDER — AMLODIPINE BESYLATE 10 MG PO TABS
10.0000 mg | ORAL_TABLET | Freq: Every day | ORAL | Status: DC
  Administered 2018-08-24 – 2018-08-25 (×2): 10 mg via ORAL
  Filled 2018-08-23 (×2): qty 1

## 2018-08-23 NOTE — ED Notes (Signed)
1st attempt to call report to 5w rn, RN off floor and unable to take report. Will attempt again.

## 2018-08-23 NOTE — ED Notes (Signed)
CBG remians 140, no change to hourly insulin gtt. Pt remains on insulin gtt due to delay in levemir from main pharmacy (has been called twice).

## 2018-08-23 NOTE — ED Notes (Signed)
Nurse drawing labs. 

## 2018-08-23 NOTE — ED Notes (Signed)
Pt's CBG result: 166, informed Karen-RN.

## 2018-08-23 NOTE — Progress Notes (Signed)
[facility] TEAM 1 - Stepdown/ICU TEAM  Epimenio Schetter Poch  KGU:[id number] DOB: 21-Jul-1961 DOA: 08/22/2018 PCP: Patient, No Pcp Per    Brief Narrative:  57 y.o. male w/ a hx of DM, hypertension, hyperlipidemia, AAA, CHF with EF of 30%, tobacco abuse, cocaine abuse, and chronic back pain who presented with nausea, vomiting, abdominal pain and generalized weakness.  In the ED he was found to have blood sugar 841, anion gap 25, elevation of beta-hydroxybutyric acid 2.14, AKI with creatinine 1.58, BUN 26, lipase 42, and tachycardia.  Subjective: Pt is seen on 5W. He is alert and oriented. He denies cp, sob, n/v, or abdom pain. He is ravenous and can't wait to eat.   Assessment & Plan:  DM 2, uncontrolled - Hyperosmolar state / Early DKA  A1c > 15.5 02/07/18 - has rapidly corrected w/ IV insulin, reflective of noncompliance as etiology - transitioned off insulin gtt - follow w/ use of levemir - investigate ability to assist in increased med compliance   Essential hypertension BP controlled   AAA CT showed 3 cm AAA unchanged since May of 2019 - to follow with PCP for repeat imaging in 3 years   Tobacco abuse Has been counseled on importance of quitting smoking  HLD Not taking his Lipitor - restarted lipitor  Nausea / vomiting and abdominal pain Resolved w/ tx of hyperglycemic state   Abnormal EKG EKG showed diffuse T wave inversion - no sx of chest pain/pressure whatsoever - repeat 12 lead in AM - f/u troponin in AM  DVT prophylaxis: lovenox  Code Status: FULL CODE Family Communication: no family present at time of exam  Disposition Plan:   Consultants:  none  Antimicrobials:  none   Objective: Blood pressure 109/82, pulse 85, temperature 98 F (36.7 C), temperature source Oral, resp. rate 14, SpO2 100 %.  Intake/Output Summary (Last 24 hours) at 08/23/2018 1151 Last data filed at 08/23/2018 6283 Gross per 24 hour  Intake 2451.65 ml  Output 300 ml  Net 2151.65 ml    There were no vitals filed for this visit.  Examination: General: No acute respiratory distress Lungs: Clear to auscultation bilaterally without wheezes or crackles Cardiovascular: Regular rate and rhythm without murmur gallop or rub normal S1 and S2 Abdomen: Nontender, nondistended, soft, bowel sounds positive, no rebound, no ascites, no appreciable mass Extremities: No significant cyanosis, clubbing, or edema bilateral lower extremities  CBC: Recent Labs  Lab 08/22/18 1932 08/22/18 1936  WBC 4.6  --   NEUTROABS 3.0  --   HGB 14.7 16.7  HCT 46.7 49.0  MCV 88.1  --   PLT 328  --    Basic Metabolic Panel: Recent Labs  Lab 08/23/18 0202 08/23/18 0533 08/23/18 1002  NA 138 140 138  K 3.2* 3.3* 3.3*  CL 93* 98 101  CO2 28 31 27   GLUCOSE 177* 122* 167*  BUN 22* 20 26*  CREATININE 1.07 0.93 0.85  CALCIUM 8.5* 8.4* 8.0*   GFR: Estimated Creatinine Clearance: 90.5 mL/min (by C-G formula based on SCr of 0.85 mg/dL).  Liver Function Tests: Recent Labs  Lab 08/22/18 1932  AST 9*  ALT 17  ALKPHOS 87  BILITOT 1.6*  PROT 7.6  ALBUMIN 3.7   Recent Labs  Lab 08/22/18 1932  LIPASE 42   Cardiac Enzymes: Recent Labs  Lab 08/23/18 0204  TROPONINI <0.03    HbA1C: Hgb A1c MFr Bld  Date/Time Value Ref Range Status  08/23/2018 03:01 AM 18.2 (H) 4.8 -  5.6 % Final    Comment:    (NOTE) Pre diabetes:          5.7%-6.4% Diabetes:              >6.4% Glycemic control for   <7.0% adults with diabetes   01/30/2018 03:44 AM >15.5 (H) 4.8 - 5.6 % Final    Comment:    (NOTE) **Verified by repeat analysis**         Prediabetes: 5.7 - 6.4         Diabetes: >6.4         Glycemic control for adults with diabetes: <7.0     CBG: Recent Labs  Lab 08/23/18 0531 08/23/18 0636 08/23/18 0815 08/23/18 0915 08/23/18 1021  GLUCAP 112* 184* 176* 166* 146*    No results found for this or any previous visit (from the past 240 hour(s)).   Scheduled Meds: . atorvastatin   20 mg Oral Daily  . enoxaparin (LOVENOX) injection  40 mg Subcutaneous Q24H  . metoCLOPramide (REGLAN) injection  5 mg Intravenous Q8H  . nicotine  21 mg Transdermal Daily     LOS: 0 days   Cherene Altes, MD [facility] Office  [phone] Pager - Text Page per Shea Evans  If 7PM-7AM, please contact night-coverage per Amion 08/23/2018, 11:51 AM

## 2018-08-23 NOTE — ED Notes (Signed)
3rd attempt to notify main pharm of missing levemir

## 2018-08-23 NOTE — ED Notes (Signed)
Pt's CBG: 140, informed Megan-RN

## 2018-08-23 NOTE — ED Notes (Signed)
RN Meagan notified of CBG 136. Pt requesting diet sprite.

## 2018-08-23 NOTE — Progress Notes (Signed)
Inpatient Diabetes Program Recommendations  AACE/ADA: New Consensus Statement on Inpatient Glycemic Control (2015)  Target Ranges:  Prepandial:   less than 140 mg/dL      Peak postprandial:   less than 180 mg/dL (1-2 hours)      Critically ill patients:  140 - 180 mg/dL   Lab Results  Component Value Date   GLUCAP 140 (H) 08/23/2018   HGBA1C 18.2 (H) 08/23/2018    Review of Glycemic ControlResults for Ryan Medina, Ryan Medina (MRN [id number]) as of 08/23/2018 13:16  Ref. Range 08/23/2018 08:15 08/23/2018 09:15 08/23/2018 10:21 08/23/2018 11:43 08/23/2018 12:48  Glucose-Capillary Latest Ref Range: 70 - 99 mg/dL 176 (H) 166 (H) 146 (H) 140 (H) 140 (H)    Diabetes history: DM 2 Outpatient Diabetes medications: Levemir 30 units in the AM and 20 units in the PM, Novolog correction Current orders for Inpatient glycemic control:  Novolog moderate tid with meals and HS, Levemir 20 units x 1  Inpatient Diabetes Program Recommendations:   Note markedly increased A1C indicating average blood sugars approximately 486 mg/dL.  Visited patient in the ED.  He was just here on 11/24 and per MD note was given Rx. For Levemir/Novolog.  Patient states that he went to [facility] to get Rx. However it was not there.  He states that he instead restarted home NPH/Regular insulin.  He states "that insulin does not work".  We discussed injection sites and he does rotate sites.  I then inquired about how long his vial of insulin has been opened and explained that insulin expires and stops working after being open for 28 days. Patient seemed surprised and states "I can't afford to get new insulin every month."  Explained that based on his ordered doses, he should need a new vial of NPH every 20 days.  He admits to not taking as much to make it "last longer".  Patient states that he needs [facility], however they did not have a prescription to fill it.  He also states that he does not have a doctor to  follow-up with.  He is working on getting his Medicaid re-established.   Patient's insulin was likely expired based on our conversation, which explains DKA occurrence.  Will order case management consult as well and f/u with patient on 12/3.  Per patient he has only had DM for 3 years.   Thanks,  Adah Perl, RN, BC-ADM Inpatient Diabetes Coordinator Pager [phone] (8a-5p)

## 2018-08-23 NOTE — ED Notes (Signed)
4th attempt to retrieve levemir from main pharmacy.  Still not sent to ER 2 hrs after order.  Pharm tech in ER to assist in expediting this.

## 2018-08-24 ENCOUNTER — Ambulatory Visit (HOSPITAL_COMMUNITY): Payer: Medicaid Other

## 2018-08-24 DIAGNOSIS — E1310 Other specified diabetes mellitus with ketoacidosis without coma: Secondary | ICD-10-CM

## 2018-08-24 LAB — CBC
HCT: 35.5 % — ABNORMAL LOW (ref 39.0–52.0)
Hemoglobin: 11.4 g/dL — ABNORMAL LOW (ref 13.0–17.0)
MCH: 28.1 pg (ref 26.0–34.0)
MCHC: 32.1 g/dL (ref 30.0–36.0)
MCV: 87.4 fL (ref 80.0–100.0)
PLATELETS: 205 10*3/uL (ref 150–400)
RBC: 4.06 MIL/uL — ABNORMAL LOW (ref 4.22–5.81)
RDW: 13 % (ref 11.5–15.5)
WBC: 6.3 10*3/uL (ref 4.0–10.5)
nRBC: 0 % (ref 0.0–0.2)

## 2018-08-24 LAB — COMPREHENSIVE METABOLIC PANEL
ALT: 15 U/L (ref 0–44)
AST: 17 U/L (ref 15–41)
Albumin: 2.8 g/dL — ABNORMAL LOW (ref 3.5–5.0)
Alkaline Phosphatase: 51 U/L (ref 38–126)
Anion gap: 12 (ref 5–15)
BUN: 26 mg/dL — AB (ref 6–20)
CO2: 32 mmol/L (ref 22–32)
Calcium: 8.4 mg/dL — ABNORMAL LOW (ref 8.9–10.3)
Chloride: 93 mmol/L — ABNORMAL LOW (ref 98–111)
Creatinine, Ser: 0.89 mg/dL (ref 0.61–1.24)
GFR calc Af Amer: >60 mL/min (ref >60)
GFR calc non Af Amer: >60 mL/min (ref >60)
Glucose, Bld: 288 mg/dL — ABNORMAL HIGH (ref 70–99)
Potassium: 3.1 mmol/L — ABNORMAL LOW (ref 3.5–5.1)
Sodium: 137 mmol/L (ref 135–145)
Total Bilirubin: 0.5 mg/dL (ref 0.3–1.2)
Total Protein: 5.4 g/dL — ABNORMAL LOW (ref 6.5–8.1)

## 2018-08-24 LAB — ECHOCARDIOGRAM COMPLETE
Height: 71 in
Weight: 2352.75 oz

## 2018-08-24 LAB — MAGNESIUM: Magnesium: 1.9 mg/dL (ref 1.7–2.4)

## 2018-08-24 LAB — TROPONIN I: Troponin I: <0.03 ng/mL (ref <0.03)

## 2018-08-24 LAB — GLUCOSE, CAPILLARY
GLUCOSE-CAPILLARY: 174 mg/dL — AB (ref 70–99)
Glucose-Capillary: 173 mg/dL — ABNORMAL HIGH (ref 70–99)
Glucose-Capillary: 240 mg/dL — ABNORMAL HIGH (ref 70–99)
Glucose-Capillary: 300 mg/dL — ABNORMAL HIGH (ref 70–99)
Glucose-Capillary: 304 mg/dL — ABNORMAL HIGH (ref 70–99)

## 2018-08-24 MED ORDER — POTASSIUM CHLORIDE CRYS ER 20 MEQ PO TBCR
40.0000 meq | EXTENDED_RELEASE_TABLET | Freq: Once | ORAL | Status: AC
  Administered 2018-08-24: 40 meq via ORAL
  Filled 2018-08-24: qty 2

## 2018-08-24 MED ORDER — METOCLOPRAMIDE HCL 5 MG/ML IJ SOLN
5.0000 mg | Freq: Four times a day (QID) | INTRAMUSCULAR | Status: AC
  Administered 2018-08-24: 5 mg via INTRAVENOUS
  Filled 2018-08-24: qty 2

## 2018-08-24 MED ORDER — PROMETHAZINE HCL 25 MG/ML IJ SOLN
12.5000 mg | Freq: Once | INTRAMUSCULAR | Status: DC
  Filled 2018-08-24: qty 1

## 2018-08-24 MED ORDER — MAGNESIUM SULFATE 2 GM/50ML IV SOLN
2.0000 g | Freq: Once | INTRAVENOUS | Status: AC
  Administered 2018-08-24: 2 g via INTRAVENOUS
  Filled 2018-08-24: qty 50

## 2018-08-24 MED ORDER — ALUM & MAG HYDROXIDE-SIMETH 200-200-20 MG/5ML PO SUSP
30.0000 mL | Freq: Four times a day (QID) | ORAL | Status: DC | PRN
  Administered 2018-08-24: 30 mL via ORAL
  Filled 2018-08-24: qty 30

## 2018-08-24 MED ORDER — INSULIN DETEMIR 100 UNIT/ML ~~LOC~~ SOLN
30.0000 [IU] | Freq: Two times a day (BID) | SUBCUTANEOUS | Status: DC
  Administered 2018-08-24 – 2018-08-25 (×2): 30 [IU] via SUBCUTANEOUS
  Filled 2018-08-24 (×2): qty 0.3

## 2018-08-24 NOTE — Progress Notes (Addendum)
PROGRESS NOTE        PATIENT DETAILS Name: Ryan Medina Age: 57 y.o. Sex: male Date of Birth: [birth year] Admit Date: 08/22/2018 Admitting Physician Cherene Altes, MD [id number], No Pcp Per  Brief Narrative: Patient is a 57 y.o. male with history of chronic systolic heart failure, IDDM, ongoing cocaine and tobacco use with nausea, vomiting and abdominal pain.  Found to have DKA-started on IV insulin, IV fluids to the hospitalist service.  See below for further details  Subjective: Lying comfortably in bed-denies any chest pain or shortness of breath  Assessment/Plan: DKA: Resolved with IV fluids and IV insulin-transition back to SQ insulin.  Nausea/vomiting: Has resolved-still nauseous-suspect he could have gastroparesis-since he still feels very uneasy-we will give him a few doses of Reglan-have counseled regarding importance of small portion meals.  Follow.    Insulin-dependent DM-2 uncontrolled hyperglycemia: A1c 18.2!  Clearly needs tight glycemic control-CBGs better but not optimal-increase Levemir to 30 units twice daily.  Apparently takes NPH and regular insulin as an outpatient-we will discuss with case management if patient is a candidate for medication assistance.  He clearly is noncompliant-due to financial issues and also by choice.  Essential hypertension: Blood pressure controlled-continue Lipitor  Dyslipidemia: Continue statin  AAA: CT scan showed a 3 cm AAA unchanged since May 2009-follow with PCP for repeat imaging  Chronic systolic heart failure: Clinically compensated-stop all IV fluids  Abnormal EKG/new diffuse T wave inversions: Denies any chest pain-troponins negative-check echo for any wall motion abnormalities.  Needs to stop doing cocaine  Hypokalemia: Replete and recheck  Cocaine use: Have counseled extensively-he is aware of the life-threatening and life disabling use of ongoing cocaine use  DVT Prophylaxis: Prophylactic  Lovenox  Code Status: Full code   Family Communication: None at bedside  Disposition Plan: Remain inpatient-hopefully home tomorrow  Antimicrobial agents: Anti-infectives (From admission, onward)   None      Procedures: None  CONSULTS:  None  Time spent: 25- minutes-Greater than 50% of this time was spent in counseling, explanation of diagnosis, planning of further management, and coordination of care.  MEDICATIONS: Scheduled Meds: . amLODipine  10 mg Oral Daily  . atorvastatin  20 mg Oral Daily  . enoxaparin (LOVENOX) injection  40 mg Subcutaneous Q24H  . insulin aspart  0-15 Units Subcutaneous TID WC  . insulin aspart  0-5 Units Subcutaneous QHS  . insulin detemir  20 Units Subcutaneous BID  . nicotine  21 mg Transdermal Daily  . [START ON 08/25/2018] pneumococcal 23 valent vaccine  0.5 mL Intramuscular Tomorrow-1000  . promethazine  12.5 mg Intravenous Once  . senna-docusate  1 tablet Oral BID   Continuous Infusions: . sodium chloride 75 mL/hr at 08/24/18 1000   PRN Meds:.acetaminophen, alum & mag hydroxide-simeth, hydrALAZINE, nitroGLYCERIN, ondansetron (ZOFRAN) IV, oxyCODONE, traMADol, zolpidem   PHYSICAL EXAM: Vital signs: Vitals:   08/23/18 1723 08/23/18 2050 08/24/18 0626 08/24/18 0902  BP: (!) 127/102 (!) 125/97 106/87   Pulse: 86 91 91   Resp: 18  16   Temp: 98.5 F (36.9 C) 98.4 F (36.9 C) 98.9 F (37.2 C)   TempSrc:  Oral Oral   SpO2: 100% 98% 99%   Weight:    66.7 kg  Height:    5\' 11"  (1.803 m)   Filed Weights   08/24/18 0902  Weight: 66.7 kg  Body mass index is 20.51 kg/m.   General appearance :Awake, alert, not in any distress.  Eyes:Pink conjunctiva HEENT: Atraumatic and Normocephalic Neck: supple Resp:Good air entry bilaterally, no added sounds  CVS: S1 S2 regular, no murmurs.  GI: Bowel sounds present, Non tender and not distended with no gaurding, rigidity or rebound.No organomegaly Extremities: B/L Lower Ext shows no  edema, both legs are warm to touch Neurology:  speech clear,Non focal, sensation is grossly intact. Psychiatric: Normal judgment and insight. Alert and oriented x 3. Normal mood. Musculoskeletal:No digital cyanosis Skin:No Rash, warm and dry Wounds:N/A  I have personally reviewed following labs and imaging studies  LABORATORY DATA: CBC: Recent Labs  Lab 08/22/18 1932 08/22/18 1936 08/24/18 0250  WBC 4.6  --  6.3  NEUTROABS 3.0  --   --   HGB 14.7 16.7 11.4*  HCT 46.7 49.0 35.5*  MCV 88.1  --  87.4  PLT 328  --  423    Basic Metabolic Panel: Recent Labs  Lab 08/22/18 2213 08/23/18 0202 08/23/18 0533 08/23/18 1002 08/24/18 0250  NA 135 138 140 138 137  K 3.1* 3.2* 3.3* 3.3* 3.1*  CL 86* 93* 98 101 93*  CO2 32 28 31 27  32  GLUCOSE 523* 177* 122* 167* 288*  BUN 24* 22* 20 26* 26*  CREATININE 1.31* 1.07 0.93 0.85 0.89  CALCIUM 8.6* 8.5* 8.4* 8.0* 8.4*  MG  --   --   --   --  1.9    GFR: Estimated Creatinine Clearance: 86.4 mL/min (by C-G formula based on SCr of 0.89 mg/dL).  Liver Function Tests: Recent Labs  Lab 08/22/18 1932 08/24/18 0250  AST 9* 17  ALT 17 15  ALKPHOS 87 51  BILITOT 1.6* 0.5  PROT 7.6 5.4*  ALBUMIN 3.7 2.8*   Recent Labs  Lab 08/22/18 1932  LIPASE 42   No results for input(s): AMMONIA in the last 168 hours.  Coagulation Profile: No results for input(s): INR, PROTIME in the last 168 hours.  Cardiac Enzymes: Recent Labs  Lab 08/23/18 0204 08/24/18 0250  TROPONINI <0.03 <0.03    BNP (last 3 results) No results for input(s): PROBNP in the last 8760 hours.  HbA1C: Recent Labs    08/23/18 0301  HGBA1C 18.2*    CBG: Recent Labs  Lab 08/23/18 1723 08/23/18 2130 08/24/18 0053 08/24/18 0808 08/24/18 1215  GLUCAP 129* 265* 300* 304* 174*    Lipid Profile: Recent Labs    08/23/18 0301  CHOL 216*  HDL 34*  LDLCALC 167*  TRIG 73  CHOLHDL 6.4    Thyroid Function Tests: No results for input(s): TSH, T4TOTAL,  FREET4, T3FREE, THYROIDAB in the last 72 hours.  Anemia Panel: No results for input(s): VITAMINB12, FOLATE, FERRITIN, TIBC, IRON, RETICCTPCT in the last 72 hours.  Urine analysis:    Component Value Date/Time   COLORURINE STRAW (A) 08/22/2018 2054   APPEARANCEUR CLEAR 08/22/2018 2054   LABSPEC 1.027 08/22/2018 2054   PHURINE 6.0 08/22/2018 2054   GLUCOSEU >=500 (A) 08/22/2018 2054   HGBUR NEGATIVE 08/22/2018 2054   Sharon NEGATIVE 08/22/2018 2054   KETONESUR 80 (A) 08/22/2018 2054   PROTEINUR NEGATIVE 08/22/2018 2054   UROBILINOGEN 4.0 (H) 12/05/2016 0921   NITRITE NEGATIVE 08/22/2018 2054   LEUKOCYTESUR NEGATIVE 08/22/2018 2054    Sepsis Labs: Lactic Acid, Venous    Component Value Date/Time   LATICACIDVEN 0.98 11/16/2017 0934    MICROBIOLOGY: Recent Results (from the past 240 hour(s))  MRSA PCR  Screening     Status: None   Collection Time: 08/23/18  7:46 PM  Result Value Ref Range Status   MRSA by PCR NEGATIVE NEGATIVE Final    Comment:        The GeneXpert MRSA Assay (FDA approved for NASAL specimens only), is one component of a comprehensive MRSA colonization surveillance program. It is not intended to diagnose MRSA infection nor to guide or monitor treatment for MRSA infections. Performed at [facility], [place] [address], [place], [place] [address]     RADIOLOGY STUDIES/RESULTS: Dg Abd Acute W/chest  Result Date: 08/15/2018 CLINICAL DATA:  Left-sided abdominal pain since July 22, 2018. Weakness and nausea. Vomiting beginning yesterday. Centralized chest pain. EXAM: DG ABDOMEN ACUTE W/ 1V CHEST COMPARISON:  None. FINDINGS: The heart, hila, and mediastinum are normal. No pneumothorax, pulmonary nodule, or infiltrate. No free air, portal venous gas, or pneumatosis. Moderate fecal loading in the colon. No bowel obstruction identified. A rounded calcification to the left of the L4 spinous process is noted. IMPRESSION: 1. Moderate fecal loading  throughout the colon. 2. Rounded 2 mm calcification to the left of the L4 spinous process could represent bowel contents. A ureteral stone could not be excluded on this study, however. Recommend clinical correlation. If there is concern for a ureteral stone, CT imaging could better evaluate. Electronically Signed   By: Dorise Bullion III M.D   On: 08/15/2018 19:02   Ct Renal Stone Study  Result Date: 08/22/2018 CLINICAL DATA:  Left flank pain. Nausea vomiting for 4 days. Previous appendectomy. EXAM: CT ABDOMEN AND PELVIS WITHOUT CONTRAST TECHNIQUE: Multidetector CT imaging of the abdomen and pelvis was performed following the standard protocol without IV contrast. COMPARISON:  CT scan December 19, 2017 FINDINGS: Lower chest: No acute abnormality. Hepatobiliary: No focal liver abnormality is seen. No gallstones, gallbladder wall thickening, or biliary dilatation. Pancreas: Unremarkable. No pancreatic ductal dilatation or surrounding inflammatory changes. Spleen: Normal in size without focal abnormality. Adrenals/Urinary Tract: Adrenal glands are normal. A 3 mm stone is again noted in the mid right kidney. No hydronephrosis or perinephric stranding. Bilateral extrarenal pelvises. No ureterectasis or ureteral stones noted. The bladder is unremarkable. Stomach/Bowel: The stomach is distended. Small bowel is normal. Moderate to severe fecal loading throughout the colon with a stool ball in the rectum. By report, the patient is status post appendectomy. 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 Atherosclerotic changes seen in the aorta, iliac vessels, and femoral vessels. Reproductive: Prostate is unremarkable. Other: No abdominal wall hernia or abnormality. No abdominopelvic ascites. Musculoskeletal: No  acute or significant osseous findings. IMPRESSION: 1. Nonobstructive 3 mm stone in the right kidney. No renal obstruction or other stones noted. 2. Gastric distention of uncertain etiology. 3. Moderate to severe fecal loading throughout the colon. 4. Atherosclerotic changes throughout the abdominal aorta and iliac vessels. 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 Recommend followup by ultrasound in 3 years. This recommendation follows ACR consensus guidelines: White Paper of the ACR Incidental Findings Committee II on Vascular Findings. Joellyn Rued Radiol 2013; [phone] Electronically Signed   By: Dorise Bullion III M.D   On: 08/22/2018 19:54     LOS: 1  day   Oren Binet, MD  [facility]  If 7PM-7AM, please contact night-coverage  Please page via [email or web address]-click on MD name and type text message  08/24/2018, 2:04 PM

## 2018-08-24 NOTE — Care Management Note (Addendum)
Case Management Note  Patient Details  Name: Ryan Medina MRN: [id number] Date of Birth: [birth year]  Subjective/Objective:    DKA. Hx of diabetes, hypertension and abdominal aneurysm           Kenn File (Brother) Regino Schultze (Sister)    [phone]      08/25/2018 11:13 am Rx meds faxed to [facility] pharmacy for pt pick up.   PCP: Pt without PCP   Action/Plan: Transition to home when medically stable NCM called [facility] to confirm levemir , lantus in stock however no answer.NCM  To f/u.... Pt will probably need Match Letter to assist with med needs.  Hospital f/u: [facility]   [phone] [place] [address]    Next Steps: Go on 09/02/2018    Instructions: 10:50 am, Freeman Caldron      Expected Discharge Date:                  Expected Discharge Plan:     In-House Referral:     Discharge planning Services     Post Acute Care Choice:    Choice offered to:     DME Arranged:    DME Agency:     HH Arranged:    HH Agency:     Status of Service:   in process  If discussed at [employer] of Stay Meetings, dates discussed:    Additional Comments:  Sharin Mons, RN 08/24/2018, 3:29 PM

## 2018-08-24 NOTE — Progress Notes (Signed)
   08/24/18 0900  Clinical Encounter Type  Visited With Patient  Visit Type Initial  Referral From Nurse  Consult/Referral To Chaplain  Spiritual Encounters  Spiritual Needs Other (Comment)  Stress Factors  Patient Stress Factors None identified   Responded to spiritual care consult. PT was alert. I offered PT a brief overview of an AD and left a copy with PT to look over. I offered spiritual care with word of encouragement and ministry of presence.   Chaplain Fidel Levy  [phone]

## 2018-08-24 NOTE — Progress Notes (Signed)
  Echocardiogram 2D Echocardiogram has been performed.  Ryan Medina Ryan Medina 08/24/2018, 10:50 AM

## 2018-08-25 LAB — GLUCOSE, CAPILLARY
Glucose-Capillary: 113 mg/dL — ABNORMAL HIGH (ref 70–99)
Glucose-Capillary: 195 mg/dL — ABNORMAL HIGH (ref 70–99)

## 2018-08-25 MED ORDER — INSULIN DETEMIR 100 UNIT/ML ~~LOC~~ SOLN: 30.0000 [IU] | Freq: Two times a day (BID) | SUBCUTANEOUS | 0 refills | Status: DC

## 2018-08-25 MED ORDER — ATORVASTATIN CALCIUM 20 MG PO TABS: 20.0000 mg | ORAL_TABLET | Freq: Every day | ORAL | 0 refills | Status: DC

## 2018-08-25 MED ORDER — METFORMIN HCL 1000 MG PO TABS: 1000.0000 mg | ORAL_TABLET | Freq: Two times a day (BID) | ORAL | 0 refills | Status: DC

## 2018-08-25 MED ORDER — GABAPENTIN 300 MG PO CAPS: 300.0000 mg | ORAL_CAPSULE | Freq: Three times a day (TID) | ORAL | 0 refills | Status: DC

## 2018-08-25 MED ORDER — NITROGLYCERIN 0.4 MG SL SUBL: 0.4000 mg | SUBLINGUAL_TABLET | SUBLINGUAL | 0 refills | Status: DC | PRN

## 2018-08-25 MED ORDER — DICYCLOMINE HCL 20 MG PO TABS: 20.0000 mg | ORAL_TABLET | Freq: Two times a day (BID) | ORAL | 0 refills | Status: DC

## 2018-08-25 MED ORDER — AMLODIPINE BESYLATE 10 MG PO TABS: 10.0000 mg | ORAL_TABLET | Freq: Every day | ORAL | 0 refills | Status: DC

## 2018-08-25 MED ORDER — GLUCOSE BLOOD VI STRP: ORAL_STRIP | 0 refills | Status: AC

## 2018-08-25 MED ORDER — INSULIN ASPART 100 UNIT/ML ~~LOC~~ SOLN: 0.0000 [IU] | Freq: Three times a day (TID) | SUBCUTANEOUS | 0 refills | Status: DC

## 2018-08-25 MED ORDER — CYCLOBENZAPRINE HCL 10 MG PO TABS: 10.0000 mg | ORAL_TABLET | Freq: Two times a day (BID) | ORAL | 0 refills | Status: DC | PRN

## 2018-08-25 NOTE — Progress Notes (Signed)
Ryan Medina discharged Home per MD order.  Discharge instructions reviewed and discussed with the patient, all questions and concerns answered. Copy of instructions, care notes and scripts given to patient, pt. [facility].  Allergies as of 08/25/2018      Reactions   Lisinopril Anaphylaxis      Medication List    STOP taking these medications   hydrochlorothiazide 12.5 MG capsule Commonly known as:  MICROZIDE   HYDROcodone-acetaminophen 5-325 MG tablet Commonly known as:  NORCO/VICODIN   insulin NPH Human 100 UNIT/ML injection Commonly known as:  HUMULIN N,NOVOLIN N   insulin regular 100 units/mL injection Commonly known as:  NOVOLIN R,HUMULIN R   meloxicam 15 MG tablet Commonly known as:  MOBIC   ondansetron 4 MG disintegrating tablet Commonly known as:  ZOFRAN-ODT     TAKE these medications   amLODipine 10 MG tablet Commonly known as:  NORVASC Take 1 tablet (10 mg total) by mouth daily.   atorvastatin 20 MG tablet Commonly known as:  LIPITOR Take 1 tablet (20 mg total) by mouth daily.   cyclobenzaprine 10 MG tablet Commonly known as:  FLEXERIL Take 1 tablet (10 mg total) by mouth 2 (two) times daily as needed for muscle spasms.   dicyclomine 20 MG tablet Commonly known as:  BENTYL Take 1 tablet (20 mg total) by mouth 2 (two) times daily.   gabapentin 300 MG capsule Commonly known as:  NEURONTIN Take 1 capsule (300 mg total) by mouth 3 (three) times daily.   glucose blood test strip Check blood sugars prior to meals and at bedtime   insulin aspart 100 UNIT/ML injection Commonly known as:  novoLOG Inject 0-15 Units into the skin 3 (three) times daily with meals. 0-15 Units, Subcutaneous, 3 times daily with meals CBG < 70: implement hypoglycemia protocol CBG 70 - 120: 0 units CBG 121 - 150: 2 units CBG 151 - 200: 3 units CBG 201 - 250: 5 units CBG 251 - 300: 8 units CBG 301 - 350: 11  units CBG 351 - 400: 15 units CBG > 400: call MD What changed:  additional instructions   insulin detemir 100 UNIT/ML injection Commonly known as:  LEVEMIR Inject 0.3 mLs (30 Units total) into the skin 2 (two) times daily. What changed:    how much to take  how to take this  when to take this  additional instructions   metFORMIN 1000 MG tablet Commonly known as:  GLUCOPHAGE Take 1 tablet (1,000 mg total) by mouth 2 (two) times daily with a meal.   nitroGLYCERIN 0.4 MG SL tablet Commonly known as:  NITROSTAT Place 1 tablet (0.4 mg total) under the tongue every 5 (five) minutes as needed for chest pain. What changed:    medication strength  See the new instructions.      IV site discontinued and catheter remains intact. Site without signs and symptoms of complications. Dressing and pressure applied.  Patient will be escorted to car by NT in a wheelchair,  no distress noted upon discharge.  Wynetta Emery, Pavle Wiler C 08/25/2018 12:08 PM

## 2018-08-25 NOTE — Discharge Summary (Signed)
PATIENT DETAILS Name: Ryan Medina Age: 57 y.o. Sex: male Date of Birth: 1960-10-17 MRN: [id number]. Admitting Physician: Cherene Altes, MD [id number], No Pcp Per  Admit Date: 08/22/2018 Discharge date: 08/25/2018  Recommendations for Outpatient Follow-up:  1. Follow up with PCP in 1-2 weeks 2. Please obtain BMP/CBC in one week 3. Continue counseling regarding importance of compliance to medication 4. Continue counseling regarding importance of avoiding cocaine and other illicit substance use  Admitted From:  Home  Disposition: [place]: No  Equipment/Devices: None  Discharge Condition: Stable  CODE STATUS: FULL CODE  Diet recommendation:  Heart Healthy / Carb Modified   Brief Summary: See H&P, Labs, Consult and Test reports for all details in brief,Patient is a 57 y.o. male with history of chronic systolic heart failure, IDDM, ongoing cocaine and tobacco use with nausea, vomiting and abdominal pain.  Found to have DKA-started on IV insulin, IV fluids to the hospitalist service.  See below for further details   Brief Hospital Course: DKA: Resolved with IV fluids and IV insulin-transition back to SQ insulin.  Nausea/vomiting: Has resolved-tolerating diet without any issues this morning..  Insulin-dependent DM-2 uncontrolled hyperglycemia: A1c 18.2!  Clearly needs tight long-term glycemic control-dosage has been increased to 30 units twice daily-CBGs appear to be relatively stable.  Spoke with case management-they will provide medication assistance-continue NovoLog and Levemir at discharge.  Patient to follow-up with PCP for further optimization.  He has been counseled extensively regarding the life-threatening and life disabling effects of hyperglycemia.   Essential hypertension: Blood pressure controlled-continue amlodipine  Dyslipidemia: Continue statin  AAA: CT scan showed a 3 cm AAA unchanged since May 2009-follow with PCP for repeat  imaging  Chronic systolic heart failure: Clinically compensated-no need for diuretics.  Not on beta-blocker due to ongoing cocaine use-not on ACEI due to prior allergy-noted to have anaphylaxis.  Abnormal EKG/new diffuse T wave inversions: Denies any chest pain-troponins negative-and stress echo showed EF around 40-45% which is a improvement from his prior echo earlier this year.  Counseled extensively-he needs to stop cocaine-although I doubt he has any intention of quitting at this time.  Hypokalemia: Repleted.  Cocaine use: Have counseled extensively-he is aware of the life-threatening and life disabling use of ongoing cocaine use--I am not sure if he has any intention of quitting at this time  Procedures/Studies: None  Discharge Diagnoses:  Principal Problem:   DKA (diabetic ketoacidoses) ([place]) Active Problems:   DM (diabetes mellitus), type 2, uncontrolled ([place])   Essential hypertension   AAA (abdominal aortic aneurysm) ([facility])   Tobacco abuse   Nausea & vomiting   HLD (hyperlipidemia)   AKI (acute kidney injury) ([place])   Abdominal pain   Discharge Instructions:  Activity:  As tolerated with Full fall precautions use walker/cane & assistance as needed  Discharge Instructions    Call MD for:  persistant dizziness or light-headedness   Complete by:  As directed    Diet - low sodium heart healthy   Complete by:  As directed    Diet Carb Modified   Complete by:  As directed    Discharge instructions   Complete by:  As directed    Follow with Primary MD  Patient in 1 week  Stop cocaine use  Check CBGs at least 3 times a day-keep a diary of your CBG recordings and take it to your primary care practitioners office at your next visit  Please get a complete blood count and chemistry  panel checked by your Primary MD at your next visit, and again as instructed by your Primary MD.  Get Medicines reviewed and adjusted: Please take all your medications with you for your next  visit with your Primary MD  Laboratory/radiological data: Please request your Primary MD to go over all hospital tests and procedure/radiological results at the follow up, please ask your Primary MD to get all Hospital records sent to his/her office.  In some cases, they will be blood work, cultures and biopsy results pending at the time of your discharge. Please request that your primary care M.D. follows up on these results.  Also Note the following: If you experience worsening of your admission symptoms, develop shortness of breath, life threatening emergency, suicidal or homicidal thoughts you must seek medical attention immediately by calling 911 or calling your MD immediately  if symptoms less severe.  You must read complete instructions/literature along with all the possible adverse reactions/side effects for all the Medicines you take and that have been prescribed to you. Take any new Medicines after you have completely understood and accpet all the possible adverse reactions/side effects.   Do not drive when taking Pain medications or sleeping medications (Benzodaizepines)  Do not take more than prescribed Pain, Sleep and Anxiety Medications. It is not advisable to combine anxiety,sleep and pain medications without talking with your primary care practitioner  Special Instructions: If you have smoked or chewed Tobacco  in the last 2 yrs please stop smoking, stop any regular Alcohol  and or any Recreational drug use.  Wear Seat belts while driving.  Please note: You were cared for by a hospitalist during your hospital stay. Once you are discharged, your primary care physician will handle any further medical issues. Please note that NO REFILLS for any discharge medications will be authorized once you are discharged, as it is imperative that you return to your primary care physician (or establish a relationship with a primary care physician if you do not have one) for your post hospital  discharge needs so that they can reassess your need for medications and monitor your lab values.   Increase activity slowly   Complete by:  As directed      Allergies as of 08/25/2018      Reactions   Lisinopril Anaphylaxis      Medication List    STOP taking these medications   hydrochlorothiazide 12.5 MG capsule Commonly known as:  MICROZIDE   HYDROcodone-acetaminophen 5-325 MG tablet Commonly known as:  NORCO/VICODIN   insulin NPH Human 100 UNIT/ML injection Commonly known as:  HUMULIN N,NOVOLIN N   insulin regular 100 units/mL injection Commonly known as:  NOVOLIN R,HUMULIN R   meloxicam 15 MG tablet Commonly known as:  MOBIC   ondansetron 4 MG disintegrating tablet Commonly known as:  ZOFRAN-ODT     TAKE these medications   amLODipine 10 MG tablet Commonly known as:  NORVASC Take 1 tablet (10 mg total) by mouth daily.   atorvastatin 20 MG tablet Commonly known as:  LIPITOR Take 1 tablet (20 mg total) by mouth daily.   cyclobenzaprine 10 MG tablet Commonly known as:  FLEXERIL Take 1 tablet (10 mg total) by mouth 2 (two) times daily as needed for muscle spasms.   dicyclomine 20 MG tablet Commonly known as:  BENTYL Take 1 tablet (20 mg total) by mouth 2 (two) times daily.   gabapentin 300 MG capsule Commonly known as:  NEURONTIN Take 1 capsule (300 mg total) by mouth  3 (three) times daily.   glucose blood test strip Check blood sugars prior to meals and at bedtime   insulin aspart 100 UNIT/ML injection Commonly known as:  novoLOG Inject 0-15 Units into the skin 3 (three) times daily with meals. 0-15 Units, Subcutaneous, 3 times daily with meals CBG < 70: implement hypoglycemia protocol CBG 70 - 120: 0 units CBG 121 - 150: 2 units CBG 151 - 200: 3 units CBG 201 - 250: 5 units CBG 251 - 300: 8 units CBG 301 - 350: 11 units CBG 351 - 400: 15 units CBG > 400: call MD What changed:  additional instructions   insulin detemir 100 UNIT/ML  injection Commonly known as:  LEVEMIR Inject 0.3 mLs (30 Units total) into the skin 2 (two) times daily. What changed:    how much to take  how to take this  when to take this  additional instructions   metFORMIN 1000 MG tablet Commonly known as:  GLUCOPHAGE Take 1 tablet (1,000 mg total) by mouth 2 (two) times daily with a meal.   nitroGLYCERIN 0.4 MG SL tablet Commonly known as:  NITROSTAT Place 1 tablet (0.4 mg total) under the tongue every 5 (five) minutes as needed for chest pain. What changed:    medication strength  See the new instructions.      Follow-up Information    [facility]. Go on 09/02/2018.   Why:  10:50 am, [facility] Contact information: [place] [id number] [phone]         Allergies  Allergen Reactions  . Lisinopril Anaphylaxis    Consultations:   None   Other Procedures/Studies: Dg Abd Acute W/chest  Result Date: 08/15/2018 CLINICAL DATA:  Left-sided abdominal pain since July 22, 2018. Weakness and nausea. Vomiting beginning yesterday. Centralized chest pain. EXAM: DG ABDOMEN ACUTE W/ 1V CHEST COMPARISON:  None. FINDINGS: The heart, hila, and mediastinum are normal. No pneumothorax, pulmonary nodule, or infiltrate. No free air, portal venous gas, or pneumatosis. Moderate fecal loading in the colon. No bowel obstruction identified. A rounded calcification to the left of the L4 spinous process is noted. IMPRESSION: 1. Moderate fecal loading throughout the colon. 2. Rounded 2 mm calcification to the left of the L4 spinous process could represent bowel contents. A ureteral stone could not be excluded on this study, however. Recommend clinical correlation. If there is concern for a ureteral stone, CT imaging could better evaluate. Electronically Signed   By: Dorise Bullion III M.D   On: 08/15/2018 19:02   Ct Renal Stone Study  Result Date: 08/22/2018 CLINICAL DATA:   Left flank pain. Nausea vomiting for 4 days. Previous appendectomy. EXAM: CT ABDOMEN AND PELVIS WITHOUT CONTRAST TECHNIQUE: Multidetector CT imaging of the abdomen and pelvis was performed following the standard protocol without IV contrast. COMPARISON:  CT scan December 19, 2017 FINDINGS: Lower chest: No acute abnormality. Hepatobiliary: No focal liver abnormality is seen. No gallstones, gallbladder wall thickening, or biliary dilatation. Pancreas: Unremarkable. No pancreatic ductal dilatation or surrounding inflammatory changes. Spleen: Normal in size without focal abnormality. Adrenals/Urinary Tract: Adrenal glands are normal. A 3 mm stone is again noted in the mid right kidney. No hydronephrosis or perinephric stranding. Bilateral extrarenal pelvises. No ureterectasis or ureteral stones noted. The bladder is unremarkable. Stomach/Bowel: The stomach is distended. Small bowel is normal. Moderate to severe fecal loading throughout the colon with a stool ball in the rectum. By report, the patient is status post  appendectomy. 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 Atherosclerotic changes seen in the aorta, iliac vessels, and femoral vessels. Reproductive: Prostate is unremarkable. Other: No abdominal wall hernia or abnormality. No abdominopelvic ascites. Musculoskeletal: No acute or significant osseous findings. IMPRESSION: 1. Nonobstructive 3 mm stone in the right kidney. No renal obstruction or other stones noted. 2. Gastric distention of uncertain etiology. 3. Moderate to severe fecal loading throughout the colon. 4. Atherosclerotic changes throughout the abdominal aorta and iliac vessels. 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 Recommend followup by ultrasound in 3 years. This recommendation follows ACR consensus guidelines: White Paper of the ACR Incidental Findings Committee II on Vascular Findings. Joellyn Rued Radiol 2013; [phone] Electronically Signed   By: Dorise Bullion III M.D   On: 08/22/2018 19:54      TODAY-DAY OF DISCHARGE:  Subjective:   Ryan Medina today has no headache,no chest abdominal pain,no new weakness tingling or numbness, feels much better wants to go home today.   Objective:   Blood pressure 125/80, pulse 88, temperature 98.8 F (37.1 C), temperature source Oral, resp. rate 16, height 5\' 11"  (1.803 m), weight 66.7 kg, SpO2 99 %.  Intake/Output Summary (Last 24 hours) at 08/25/2018 1117 Last data filed at 08/24/2018 1600 Gross per 24 hour  Intake 501.37 ml  Output -  Net 501.37 ml   Filed Weights   08/24/18 0902  Weight: 66.7 kg    Exam: Awake Alert, Oriented *3, No new F.N deficits, Normal affect [place].AT,PERRAL Supple Neck,No JVD, No cervical lymphadenopathy appriciated.  Symmetrical Chest wall movement, Good air movement bilaterally, CTAB RRR,No Gallops,Rubs or new Murmurs, No Parasternal Heave +ve B.Sounds, Abd Soft, Non tender, No organomegaly appriciated, No rebound -guarding or rigidity. No Cyanosis, Clubbing or edema, No new Rash or bruise   PERTINENT RADIOLOGIC STUDIES: Dg Abd Acute W/chest  Result Date: 08/15/2018 CLINICAL DATA:  Left-sided abdominal pain since July 22, 2018. Weakness and nausea. Vomiting beginning yesterday. Centralized chest pain. EXAM: DG ABDOMEN ACUTE W/ 1V CHEST COMPARISON:  None. FINDINGS: The heart, hila, and mediastinum are normal. No pneumothorax, pulmonary nodule, or infiltrate. No free air, portal venous gas, or pneumatosis. Moderate fecal loading in the colon. No bowel obstruction identified. A rounded calcification to the left of the L4 spinous process is noted. IMPRESSION: 1. Moderate fecal loading  throughout the colon. 2. Rounded 2 mm calcification to the left of the L4 spinous process could represent bowel contents. A ureteral stone could not be excluded on this study, however. Recommend clinical correlation. If there is concern for a ureteral stone, CT imaging could better evaluate. Electronically Signed   By: Dorise Bullion III M.D   On: 08/15/2018 19:02   Ct Renal Stone Study  Result Date: 08/22/2018 CLINICAL DATA:  Left flank pain. Nausea vomiting for 4 days. Previous appendectomy. EXAM: CT ABDOMEN AND PELVIS WITHOUT CONTRAST TECHNIQUE: Multidetector CT imaging of the abdomen and pelvis was performed following the standard protocol without IV contrast. COMPARISON:  CT scan December 19, 2017 FINDINGS: Lower chest: No acute abnormality. Hepatobiliary: No focal liver abnormality is seen. No gallstones, gallbladder wall thickening, or biliary dilatation. Pancreas: Unremarkable. No pancreatic ductal dilatation or surrounding inflammatory  changes. Spleen: Normal in size without focal abnormality. Adrenals/Urinary Tract: Adrenal glands are normal. A 3 mm stone is again noted in the mid right kidney. No hydronephrosis or perinephric stranding. Bilateral extrarenal pelvises. No ureterectasis or ureteral stones noted. The bladder is unremarkable. Stomach/Bowel: The stomach is distended. Small bowel is normal. Moderate to severe fecal loading throughout the colon with a stool ball in the rectum. By report, the patient is status post appendectomy. 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 Atherosclerotic changes seen in the aorta, iliac vessels, and femoral vessels. Reproductive: Prostate is unremarkable. Other: No abdominal wall hernia or abnormality. No abdominopelvic ascites. Musculoskeletal: No  acute or significant osseous findings. IMPRESSION: 1. Nonobstructive 3 mm stone in the right kidney. No renal obstruction or other stones noted. 2. Gastric distention of uncertain etiology. 3. Moderate to severe fecal loading throughout the colon. 4. Atherosclerotic changes throughout the abdominal aorta and iliac vessels. 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 Recommend followup by ultrasound in 3 years. This recommendation follows ACR consensus guidelines: White Paper of the ACR Incidental Findings Committee II on Vascular Findings. Natasha Mead Coll Radiol 2013; [phone] Electronically Signed   By: Dorise Bullion III M.D   On: 08/22/2018 19:54     PERTINENT LAB RESULTS: CBC: Recent Labs    08/22/18 1932 08/22/18 1936 08/24/18 0250  WBC 4.6  --  6.3  HGB 14.7 16.7 11.4*  HCT 46.7 49.0 35.5*  PLT 328  --  205   CMET CMP     Component Value Date/Time   NA 137 08/24/2018 0250   K 3.1 (L) 08/24/2018 0250   CL 93 (L) 08/24/2018 0250   CO2 32 08/24/2018 0250   GLUCOSE 288 (H) 08/24/2018 0250   BUN 26 (H) 08/24/2018 0250   CREATININE 0.89 08/24/2018 0250   CREATININE 0.92 05/15/2016 1038   CALCIUM 8.4 (L) 08/24/2018 0250   PROT 5.4 (L) 08/24/2018 0250   ALBUMIN 2.8 (L) 08/24/2018 0250   AST 17 08/24/2018 0250   ALT 15 08/24/2018 0250   ALKPHOS 51 08/24/2018 0250   BILITOT 0.5 08/24/2018 0250   GFRNONAA >60 08/24/2018 0250   GFRAA >60 08/24/2018 0250    GFR Estimated Creatinine Clearance: 86.4 mL/min (by C-G formula based on SCr of 0.89 mg/dL). Recent Labs    08/22/18 1932  LIPASE 42   Recent Labs    08/23/18 0204 08/24/18 0250  TROPONINI <0.03 <0.03   Invalid input(s): POCBNP No results for input(s): DDIMER in the last 72 hours. Recent Labs    08/23/18 0301  HGBA1C 18.2*   Recent Labs    08/23/18 0301  CHOL 216*  HDL 34*  LDLCALC 167*  TRIG 73  CHOLHDL 6.4     No results for input(s): TSH, T4TOTAL, T3FREE, THYROIDAB in the last 72 hours.  Invalid input(s): FREET3 No results for input(s): VITAMINB12, FOLATE, FERRITIN, TIBC, IRON, RETICCTPCT in the last 72 hours. Coags: No results for input(s): INR in the last 72 hours.  Invalid input(s): PT Microbiology: Recent Results (from the past 240 hour(s))  MRSA PCR Screening     Status: None   Collection Time: 08/23/18  7:46 PM  Result Value Ref Range  Status   MRSA by PCR NEGATIVE NEGATIVE Final    Comment:        The GeneXpert MRSA Assay (FDA approved for NASAL specimens only), is one component of a comprehensive MRSA colonization surveillance program. It is not intended to diagnose MRSA infection nor to guide or monitor treatment for MRSA infections. Performed at [facility], [place] [address], [place], [place] [address]     FURTHER DISCHARGE INSTRUCTIONS:  Get Medicines reviewed and adjusted: Please take all your medications with you for your next visit with your Primary MD  Laboratory/radiological data: Please request your Primary MD to go over all hospital tests and procedure/radiological results at the follow up, please ask your Primary MD to get all Hospital records sent to his/her office.  In some cases, they will be blood work, cultures and biopsy results pending at the time of your discharge. Please request that your primary care M.D. goes through all the records of your hospital data and follows up on these results.  Also Note the following: If you experience worsening of your admission symptoms, develop shortness of breath, life threatening emergency, suicidal or homicidal thoughts you must seek medical attention immediately by calling 911 or calling your MD immediately  if symptoms less severe.  You must read complete instructions/literature along with all the possible adverse reactions/side effects for all the Medicines you take and that have been prescribed to you.  Take any new Medicines after you have completely understood and accpet all the possible adverse reactions/side effects.   Do not drive when taking Pain medications or sleeping medications (Benzodaizepines)  Do not take more than prescribed Pain, Sleep and Anxiety Medications. It is not advisable to combine anxiety,sleep and pain medications without talking with your primary care practitioner  Special Instructions: If you have smoked or chewed Tobacco  in the last 2 yrs please stop smoking, stop any regular Alcohol  and or any Recreational drug use.  Wear Seat belts while driving.  Please note: You were cared for by a hospitalist during your hospital stay. Once you are discharged, your primary care physician will handle any further medical issues. Please note that NO REFILLS for any discharge medications will be authorized once you are discharged, as it is imperative that you return to your primary care physician (or establish a relationship with a primary care physician if you do not have one) for your post hospital discharge needs so that they can reassess your need for medications and monitor your lab values.  Total Time spent coordinating discharge including counseling, education and face to face time equals 45 minutes.  SignedOren Binet 08/25/2018 11:17 AM

## 2018-08-27 MED FILL — TRUE METRIX TEST STRIP: 25 days supply | Qty: 100 | Fill #0

## 2018-08-27 MED FILL — AMLODIPINE BESYLATE 10 MG T: 10 | 30 days supply | Qty: 30 | Fill #0

## 2018-08-27 MED FILL — GABAPENTIN 300 MG CAPSULE: 300 | 30 days supply | Qty: 90 | Fill #0

## 2018-08-27 MED FILL — DICYCLOMINE 20 MG TABLET: 20 | 10 days supply | Qty: 20 | Fill #0

## 2018-08-27 MED FILL — NITROGLYCERIN 0.4 MG TAB SL: 0.4 | 25 days supply | Qty: 25 | Fill #0

## 2018-08-27 MED FILL — ATORVASTATIN 20 MG TABLET: 20 | 30 days supply | Qty: 30 | Fill #0

## 2018-08-27 MED FILL — LEVEMIR 100 UNITS/ML VIAL: 100 | 33 days supply | Qty: 20 | Fill #0

## 2018-08-27 MED FILL — CYCLOBENZAPRINE 10 MG TAB: 10 | 10 days supply | Qty: 20 | Fill #0

## 2018-08-27 MED FILL — metFORMIN HCL 1000 MG TABS: 1000 | 30 days supply | Qty: 60 | Fill #0

## 2018-09-02 ENCOUNTER — Inpatient Hospital Stay: Payer: Self-pay

## 2018-09-03 ENCOUNTER — Inpatient Hospital Stay (HOSPITAL_COMMUNITY)
Admission: EM | Admit: 2018-09-03 | Discharge: 2018-12-15 | DRG: 270 | Disposition: A | Discharge status: Skilled Nursing Facility | Payer: Medicaid Other | Attending: Internal Medicine | Admitting: Internal Medicine

## 2018-09-03 ENCOUNTER — Encounter (HOSPITAL_COMMUNITY): Payer: Self-pay | Admitting: *Deleted

## 2018-09-03 ENCOUNTER — Emergency Department (HOSPITAL_COMMUNITY): Payer: Medicaid Other | Admitting: Anesthesiology

## 2018-09-03 ENCOUNTER — Encounter (HOSPITAL_COMMUNITY)
Admission: EM | Disposition: A | Discharge status: Skilled Nursing Facility | Payer: Self-pay | Source: Home / Self Care | Attending: Internal Medicine

## 2018-09-03 DIAGNOSIS — F1410 Cocaine abuse, uncomplicated: Secondary | ICD-10-CM | POA: Diagnosis not present

## 2018-09-03 DIAGNOSIS — Z01818 Encounter for other preprocedural examination: Secondary | ICD-10-CM

## 2018-09-03 DIAGNOSIS — K922 Gastrointestinal hemorrhage, unspecified: Secondary | ICD-10-CM | POA: Diagnosis not present

## 2018-09-03 DIAGNOSIS — E1165 Type 2 diabetes mellitus with hyperglycemia: Secondary | ICD-10-CM | POA: Diagnosis not present

## 2018-09-03 DIAGNOSIS — K2970 Gastritis, unspecified, without bleeding: Secondary | ICD-10-CM | POA: Diagnosis not present

## 2018-09-03 DIAGNOSIS — D490 Neoplasm of unspecified behavior of digestive system: Secondary | ICD-10-CM | POA: Diagnosis not present

## 2018-09-03 DIAGNOSIS — K3189 Other diseases of stomach and duodenum: Secondary | ICD-10-CM | POA: Diagnosis not present

## 2018-09-03 DIAGNOSIS — Z7401 Bed confinement status: Secondary | ICD-10-CM

## 2018-09-03 DIAGNOSIS — Z7151 Drug abuse counseling and surveillance of drug abuser: Secondary | ICD-10-CM

## 2018-09-03 DIAGNOSIS — S88111A Complete traumatic amputation at level between knee and ankle, right lower leg, initial encounter: Secondary | ICD-10-CM | POA: Diagnosis not present

## 2018-09-03 DIAGNOSIS — C259 Malignant neoplasm of pancreas, unspecified: Secondary | ICD-10-CM | POA: Diagnosis not present

## 2018-09-03 DIAGNOSIS — I255 Ischemic cardiomyopathy: Secondary | ICD-10-CM | POA: Diagnosis present

## 2018-09-03 DIAGNOSIS — K311 Adult hypertrophic pyloric stenosis: Secondary | ICD-10-CM | POA: Diagnosis present

## 2018-09-03 DIAGNOSIS — E876 Hypokalemia: Secondary | ICD-10-CM | POA: Diagnosis not present

## 2018-09-03 DIAGNOSIS — Z515 Encounter for palliative care: Secondary | ICD-10-CM | POA: Diagnosis not present

## 2018-09-03 DIAGNOSIS — I5042 Chronic combined systolic (congestive) and diastolic (congestive) heart failure: Secondary | ICD-10-CM | POA: Diagnosis present

## 2018-09-03 DIAGNOSIS — Z833 Family history of diabetes mellitus: Secondary | ICD-10-CM

## 2018-09-03 DIAGNOSIS — I97638 Postprocedural hematoma of a circulatory system organ or structure following other circulatory system procedure: Secondary | ICD-10-CM | POA: Diagnosis not present

## 2018-09-03 DIAGNOSIS — M79604 Pain in right leg: Secondary | ICD-10-CM | POA: Diagnosis not present

## 2018-09-03 DIAGNOSIS — I10 Essential (primary) hypertension: Secondary | ICD-10-CM | POA: Diagnosis present

## 2018-09-03 DIAGNOSIS — R935 Abnormal findings on diagnostic imaging of other abdominal regions, including retroperitoneum: Secondary | ICD-10-CM | POA: Diagnosis not present

## 2018-09-03 DIAGNOSIS — J189 Pneumonia, unspecified organism: Secondary | ICD-10-CM | POA: Diagnosis not present

## 2018-09-03 DIAGNOSIS — N390 Urinary tract infection, site not specified: Secondary | ICD-10-CM | POA: Diagnosis not present

## 2018-09-03 DIAGNOSIS — R140 Abdominal distension (gaseous): Secondary | ICD-10-CM | POA: Diagnosis not present

## 2018-09-03 DIAGNOSIS — R578 Other shock: Secondary | ICD-10-CM | POA: Diagnosis not present

## 2018-09-03 DIAGNOSIS — Z0189 Encounter for other specified special examinations: Secondary | ICD-10-CM

## 2018-09-03 DIAGNOSIS — K283 Acute gastrojejunal ulcer without hemorrhage or perforation: Secondary | ICD-10-CM | POA: Diagnosis not present

## 2018-09-03 DIAGNOSIS — L89152 Pressure ulcer of sacral region, stage 2: Secondary | ICD-10-CM | POA: Diagnosis not present

## 2018-09-03 DIAGNOSIS — R571 Hypovolemic shock: Secondary | ICD-10-CM | POA: Diagnosis not present

## 2018-09-03 DIAGNOSIS — E43 Unspecified severe protein-calorie malnutrition: Secondary | ICD-10-CM | POA: Diagnosis not present

## 2018-09-03 DIAGNOSIS — K228 Other specified diseases of esophagus: Secondary | ICD-10-CM | POA: Diagnosis present

## 2018-09-03 DIAGNOSIS — D72829 Elevated white blood cell count, unspecified: Secondary | ICD-10-CM

## 2018-09-03 DIAGNOSIS — I998 Other disorder of circulatory system: Secondary | ICD-10-CM | POA: Diagnosis present

## 2018-09-03 DIAGNOSIS — I420 Dilated cardiomyopathy: Secondary | ICD-10-CM | POA: Diagnosis not present

## 2018-09-03 DIAGNOSIS — E11649 Type 2 diabetes mellitus with hypoglycemia without coma: Secondary | ICD-10-CM | POA: Diagnosis not present

## 2018-09-03 DIAGNOSIS — G9341 Metabolic encephalopathy: Secondary | ICD-10-CM | POA: Diagnosis not present

## 2018-09-03 DIAGNOSIS — R079 Chest pain, unspecified: Secondary | ICD-10-CM | POA: Diagnosis not present

## 2018-09-03 DIAGNOSIS — M62261 Nontraumatic ischemic infarction of muscle, right lower leg: Secondary | ICD-10-CM | POA: Diagnosis not present

## 2018-09-03 DIAGNOSIS — I739 Peripheral vascular disease, unspecified: Secondary | ICD-10-CM | POA: Diagnosis present

## 2018-09-03 DIAGNOSIS — K264 Chronic or unspecified duodenal ulcer with hemorrhage: Secondary | ICD-10-CM | POA: Diagnosis not present

## 2018-09-03 DIAGNOSIS — R52 Pain, unspecified: Secondary | ICD-10-CM | POA: Diagnosis not present

## 2018-09-03 DIAGNOSIS — C250 Malignant neoplasm of head of pancreas: Secondary | ICD-10-CM | POA: Diagnosis not present

## 2018-09-03 DIAGNOSIS — E1152 Type 2 diabetes mellitus with diabetic peripheral angiopathy with gangrene: Secondary | ICD-10-CM | POA: Diagnosis not present

## 2018-09-03 DIAGNOSIS — Z01810 Encounter for preprocedural cardiovascular examination: Secondary | ICD-10-CM | POA: Diagnosis not present

## 2018-09-03 DIAGNOSIS — K766 Portal hypertension: Secondary | ICD-10-CM | POA: Diagnosis present

## 2018-09-03 DIAGNOSIS — I723 Aneurysm of iliac artery: Secondary | ICD-10-CM | POA: Diagnosis present

## 2018-09-03 DIAGNOSIS — G893 Neoplasm related pain (acute) (chronic): Secondary | ICD-10-CM | POA: Diagnosis not present

## 2018-09-03 DIAGNOSIS — E11641 Type 2 diabetes mellitus with hypoglycemia with coma: Secondary | ICD-10-CM

## 2018-09-03 DIAGNOSIS — D638 Anemia in other chronic diseases classified elsewhere: Secondary | ICD-10-CM | POA: Diagnosis present

## 2018-09-03 DIAGNOSIS — Z720 Tobacco use: Secondary | ICD-10-CM | POA: Diagnosis not present

## 2018-09-03 DIAGNOSIS — I2782 Chronic pulmonary embolism: Secondary | ICD-10-CM | POA: Diagnosis present

## 2018-09-03 DIAGNOSIS — C251 Malignant neoplasm of body of pancreas: Secondary | ICD-10-CM

## 2018-09-03 DIAGNOSIS — B965 Pseudomonas (aeruginosa) (mallei) (pseudomallei) as the cause of diseases classified elsewhere: Secondary | ICD-10-CM | POA: Diagnosis not present

## 2018-09-03 DIAGNOSIS — R509 Fever, unspecified: Secondary | ICD-10-CM

## 2018-09-03 DIAGNOSIS — M79A21 Nontraumatic compartment syndrome of right lower extremity: Secondary | ICD-10-CM | POA: Diagnosis present

## 2018-09-03 DIAGNOSIS — Z5900 Homelessness unspecified: Secondary | ICD-10-CM

## 2018-09-03 DIAGNOSIS — F17210 Nicotine dependence, cigarettes, uncomplicated: Secondary | ICD-10-CM | POA: Diagnosis present

## 2018-09-03 DIAGNOSIS — Z79899 Other long term (current) drug therapy: Secondary | ICD-10-CM | POA: Diagnosis not present

## 2018-09-03 DIAGNOSIS — B3781 Candidal esophagitis: Secondary | ICD-10-CM | POA: Diagnosis not present

## 2018-09-03 DIAGNOSIS — Z5181 Encounter for therapeutic drug level monitoring: Secondary | ICD-10-CM | POA: Diagnosis not present

## 2018-09-03 DIAGNOSIS — D62 Acute posthemorrhagic anemia: Secondary | ICD-10-CM | POA: Diagnosis not present

## 2018-09-03 DIAGNOSIS — Z6820 Body mass index (BMI) 20.0-20.9, adult: Secondary | ICD-10-CM

## 2018-09-03 DIAGNOSIS — G540 Brachial plexus disorders: Secondary | ICD-10-CM | POA: Diagnosis present

## 2018-09-03 DIAGNOSIS — J9601 Acute respiratory failure with hypoxia: Secondary | ICD-10-CM | POA: Diagnosis not present

## 2018-09-03 DIAGNOSIS — N200 Calculus of kidney: Secondary | ICD-10-CM | POA: Diagnosis present

## 2018-09-03 DIAGNOSIS — Y95 Nosocomial condition: Secondary | ICD-10-CM | POA: Diagnosis not present

## 2018-09-03 DIAGNOSIS — I743 Embolism and thrombosis of arteries of the lower extremities: Secondary | ICD-10-CM | POA: Diagnosis not present

## 2018-09-03 DIAGNOSIS — D6859 Other primary thrombophilia: Secondary | ICD-10-CM | POA: Diagnosis present

## 2018-09-03 DIAGNOSIS — E119 Type 2 diabetes mellitus without complications: Secondary | ICD-10-CM | POA: Diagnosis not present

## 2018-09-03 DIAGNOSIS — I81 Portal vein thrombosis: Secondary | ICD-10-CM | POA: Diagnosis not present

## 2018-09-03 DIAGNOSIS — K921 Melena: Secondary | ICD-10-CM | POA: Diagnosis not present

## 2018-09-03 DIAGNOSIS — Z9111 Patient's noncompliance with dietary regimen: Secondary | ICD-10-CM

## 2018-09-03 DIAGNOSIS — K289 Gastrojejunal ulcer, unspecified as acute or chronic, without hemorrhage or perforation: Secondary | ICD-10-CM | POA: Diagnosis not present

## 2018-09-03 DIAGNOSIS — G894 Chronic pain syndrome: Secondary | ICD-10-CM | POA: Diagnosis not present

## 2018-09-03 DIAGNOSIS — K209 Esophagitis, unspecified: Secondary | ICD-10-CM | POA: Diagnosis not present

## 2018-09-03 DIAGNOSIS — Z978 Presence of other specified devices: Secondary | ICD-10-CM | POA: Diagnosis not present

## 2018-09-03 DIAGNOSIS — K920 Hematemesis: Secondary | ICD-10-CM | POA: Diagnosis not present

## 2018-09-03 DIAGNOSIS — R05 Cough: Secondary | ICD-10-CM

## 2018-09-03 DIAGNOSIS — C787 Secondary malignant neoplasm of liver and intrahepatic bile duct: Secondary | ICD-10-CM | POA: Diagnosis not present

## 2018-09-03 DIAGNOSIS — I714 Abdominal aortic aneurysm, without rupture, unspecified: Secondary | ICD-10-CM | POA: Diagnosis present

## 2018-09-03 DIAGNOSIS — K269 Duodenal ulcer, unspecified as acute or chronic, without hemorrhage or perforation: Secondary | ICD-10-CM | POA: Diagnosis not present

## 2018-09-03 DIAGNOSIS — R0789 Other chest pain: Secondary | ICD-10-CM | POA: Diagnosis present

## 2018-09-03 DIAGNOSIS — Z888 Allergy status to other drugs, medicaments and biological substances status: Secondary | ICD-10-CM

## 2018-09-03 DIAGNOSIS — K625 Hemorrhage of anus and rectum: Secondary | ICD-10-CM | POA: Diagnosis not present

## 2018-09-03 DIAGNOSIS — K210 Gastro-esophageal reflux disease with esophagitis: Secondary | ICD-10-CM | POA: Diagnosis present

## 2018-09-03 DIAGNOSIS — Z66 Do not resuscitate: Secondary | ICD-10-CM | POA: Diagnosis not present

## 2018-09-03 DIAGNOSIS — I96 Gangrene, not elsewhere classified: Secondary | ICD-10-CM | POA: Diagnosis not present

## 2018-09-03 DIAGNOSIS — Z8249 Family history of ischemic heart disease and other diseases of the circulatory system: Secondary | ICD-10-CM

## 2018-09-03 DIAGNOSIS — Z980 Intestinal bypass and anastomosis status: Secondary | ICD-10-CM | POA: Diagnosis not present

## 2018-09-03 DIAGNOSIS — N179 Acute kidney failure, unspecified: Secondary | ICD-10-CM | POA: Diagnosis not present

## 2018-09-03 DIAGNOSIS — K315 Obstruction of duodenum: Secondary | ICD-10-CM | POA: Diagnosis present

## 2018-09-03 DIAGNOSIS — J9690 Respiratory failure, unspecified, unspecified whether with hypoxia or hypercapnia: Secondary | ICD-10-CM

## 2018-09-03 DIAGNOSIS — A419 Sepsis, unspecified organism: Secondary | ICD-10-CM | POA: Diagnosis not present

## 2018-09-03 DIAGNOSIS — I2699 Other pulmonary embolism without acute cor pulmonale: Secondary | ICD-10-CM | POA: Diagnosis not present

## 2018-09-03 DIAGNOSIS — I864 Gastric varices: Secondary | ICD-10-CM | POA: Diagnosis present

## 2018-09-03 DIAGNOSIS — R64 Cachexia: Secondary | ICD-10-CM | POA: Diagnosis present

## 2018-09-03 DIAGNOSIS — R531 Weakness: Secondary | ICD-10-CM | POA: Diagnosis not present

## 2018-09-03 DIAGNOSIS — R0602 Shortness of breath: Secondary | ICD-10-CM

## 2018-09-03 DIAGNOSIS — Z7189 Other specified counseling: Secondary | ICD-10-CM | POA: Diagnosis not present

## 2018-09-03 DIAGNOSIS — K8689 Other specified diseases of pancreas: Secondary | ICD-10-CM | POA: Diagnosis not present

## 2018-09-03 DIAGNOSIS — E118 Type 2 diabetes mellitus with unspecified complications: Secondary | ICD-10-CM

## 2018-09-03 DIAGNOSIS — Z681 Body mass index (BMI) 19 or less, adult: Secondary | ICD-10-CM

## 2018-09-03 DIAGNOSIS — Z7901 Long term (current) use of anticoagulants: Secondary | ICD-10-CM | POA: Diagnosis not present

## 2018-09-03 DIAGNOSIS — K259 Gastric ulcer, unspecified as acute or chronic, without hemorrhage or perforation: Secondary | ICD-10-CM

## 2018-09-03 DIAGNOSIS — I898 Other specified noninfective disorders of lymphatic vessels and lymph nodes: Secondary | ICD-10-CM | POA: Diagnosis present

## 2018-09-03 DIAGNOSIS — E785 Hyperlipidemia, unspecified: Secondary | ICD-10-CM | POA: Diagnosis present

## 2018-09-03 DIAGNOSIS — E1151 Type 2 diabetes mellitus with diabetic peripheral angiopathy without gangrene: Secondary | ICD-10-CM | POA: Diagnosis present

## 2018-09-03 DIAGNOSIS — I110 Hypertensive heart disease with heart failure: Secondary | ICD-10-CM | POA: Diagnosis present

## 2018-09-03 DIAGNOSIS — IMO0002 Reserved for concepts with insufficient information to code with codable children: Secondary | ICD-10-CM | POA: Diagnosis present

## 2018-09-03 DIAGNOSIS — I70211 Atherosclerosis of native arteries of extremities with intermittent claudication, right leg: Secondary | ICD-10-CM | POA: Diagnosis present

## 2018-09-03 DIAGNOSIS — S8011XA Contusion of right lower leg, initial encounter: Secondary | ICD-10-CM | POA: Diagnosis present

## 2018-09-03 DIAGNOSIS — R059 Cough, unspecified: Secondary | ICD-10-CM

## 2018-09-03 DIAGNOSIS — R000 Tachycardia, unspecified: Secondary | ICD-10-CM | POA: Diagnosis not present

## 2018-09-03 DIAGNOSIS — K449 Diaphragmatic hernia without obstruction or gangrene: Secondary | ICD-10-CM | POA: Diagnosis present

## 2018-09-03 DIAGNOSIS — Z716 Tobacco abuse counseling: Secondary | ICD-10-CM

## 2018-09-03 DIAGNOSIS — Z794 Long term (current) use of insulin: Secondary | ICD-10-CM

## 2018-09-03 DIAGNOSIS — R040 Epistaxis: Secondary | ICD-10-CM | POA: Diagnosis not present

## 2018-09-03 HISTORY — PX: EMBOLECTOMY: SHX44

## 2018-09-03 HISTORY — PX: ABDOMINAL AORTOGRAM W/LOWER EXTREMITY: CATH118223

## 2018-09-03 HISTORY — DX: Other specified counseling: Z71.89

## 2018-09-03 LAB — CBC WITH DIFFERENTIAL/PLATELET
Abs Immature Granulocytes: 0.04 10*3/uL (ref 0.00–0.07)
Basophils Absolute: 0 10*3/uL (ref 0.0–0.1)
Basophils Relative: 0 %
Eosinophils Absolute: 0 10*3/uL (ref 0.0–0.5)
Eosinophils Relative: 0 %
HCT: 31.9 % — ABNORMAL LOW (ref 39.0–52.0)
HEMOGLOBIN: 9.8 g/dL — AB (ref 13.0–17.0)
Immature Granulocytes: 0 %
Lymphocytes Relative: 20 %
Lymphs Abs: 1.9 10*3/uL (ref 0.7–4.0)
MCH: 27.3 pg (ref 26.0–34.0)
MCHC: 30.7 g/dL (ref 30.0–36.0)
MCV: 88.9 fL (ref 80.0–100.0)
Monocytes Absolute: 0.7 10*3/uL (ref 0.1–1.0)
Monocytes Relative: 7 %
Neutro Abs: 6.8 10*3/uL (ref 1.7–7.7)
Neutrophils Relative %: 73 %
Platelets: 378 10*3/uL (ref 150–400)
RBC: 3.59 MIL/uL — ABNORMAL LOW (ref 4.22–5.81)
RDW: 12.6 % (ref 11.5–15.5)
WBC: 9.4 10*3/uL (ref 4.0–10.5)
nRBC: 0 % (ref 0.0–0.2)

## 2018-09-03 LAB — POCT I-STAT 4, (NA,K, GLUC, HGB,HCT)
Glucose, Bld: 291 mg/dL — ABNORMAL HIGH (ref 70–99)
Glucose, Bld: 281 mg/dL — ABNORMAL HIGH (ref 70–99)
HCT: 29 % — ABNORMAL LOW (ref 39.0–52.0)
HCT: 24 % — ABNORMAL LOW (ref 39.0–52.0)
HEMOGLOBIN: 8.2 g/dL — AB (ref 13.0–17.0)
Hemoglobin: 9.9 g/dL — ABNORMAL LOW (ref 13.0–17.0)
POTASSIUM: 2.9 mmol/L — AB (ref 3.5–5.1)
Potassium: 2.5 mmol/L — CL (ref 3.5–5.1)
Sodium: 126 mmol/L — ABNORMAL LOW (ref 135–145)
Sodium: 127 mmol/L — ABNORMAL LOW (ref 135–145)

## 2018-09-03 LAB — CBC
HCT: 28.5 % — ABNORMAL LOW (ref 39.0–52.0)
Hemoglobin: 9.1 g/dL — ABNORMAL LOW (ref 13.0–17.0)
MCH: 28.2 pg (ref 26.0–34.0)
MCHC: 31.9 g/dL (ref 30.0–36.0)
MCV: 88.2 fL (ref 80.0–100.0)
Platelets: 292 10*3/uL (ref 150–400)
RBC: 3.23 MIL/uL — ABNORMAL LOW (ref 4.22–5.81)
RDW: 12.6 % (ref 11.5–15.5)
WBC: 7.8 10*3/uL (ref 4.0–10.5)
nRBC: 0 % (ref 0.0–0.2)

## 2018-09-03 LAB — I-STAT CHEM 8, ED
BUN: 23 mg/dL — ABNORMAL HIGH (ref 6–20)
Calcium, Ion: 0.86 mmol/L — CL (ref 1.15–1.40)
Chloride: 81 mmol/L — ABNORMAL LOW (ref 98–111)
Creatinine, Ser: 1.1 mg/dL (ref 0.61–1.24)
Glucose, Bld: 216 mg/dL — ABNORMAL HIGH (ref 70–99)
HCT: 30 % — ABNORMAL LOW (ref 39.0–52.0)
Hemoglobin: 10.2 g/dL — ABNORMAL LOW (ref 13.0–17.0)
Potassium: 2.4 mmol/L — CL (ref 3.5–5.1)
Sodium: 129 mmol/L — ABNORMAL LOW (ref 135–145)
TCO2: 36 mmol/L — ABNORMAL HIGH (ref 22–32)

## 2018-09-03 LAB — PROTIME-INR
INR: 1.03
Prothrombin Time: 13.4 seconds (ref 11.4–15.2)

## 2018-09-03 LAB — GLUCOSE, CAPILLARY
Glucose-Capillary: 243 mg/dL — ABNORMAL HIGH (ref 70–99)
Glucose-Capillary: 261 mg/dL — ABNORMAL HIGH (ref 70–99)
Glucose-Capillary: 91 mg/dL (ref 70–99)

## 2018-09-03 LAB — PREPARE RBC (CROSSMATCH)

## 2018-09-03 LAB — ABO/RH: ABO/RH(D): B POS

## 2018-09-03 SURGERY — EMBOLECTOMY
Anesthesia: General | Site: Leg Upper | Laterality: Right

## 2018-09-03 SURGERY — ABDOMINAL AORTOGRAM W/LOWER EXTREMITY
Anesthesia: LOCAL | Laterality: Right

## 2018-09-03 MED ORDER — POTASSIUM CHLORIDE 10 MEQ/100ML IV SOLN
INTRAVENOUS | Status: AC
  Filled 2018-09-03: qty 100

## 2018-09-03 MED ORDER — ACETAMINOPHEN 650 MG RE SUPP: 650.0000 mg | Freq: Four times a day (QID) | RECTAL | Status: DC | PRN

## 2018-09-03 MED ORDER — 0.9 % SODIUM CHLORIDE (POUR BTL) OPTIME
TOPICAL | Status: DC | PRN
  Administered 2018-09-03: 2000 mL

## 2018-09-03 MED ORDER — LIDOCAINE HCL (PF) 1 % IJ SOLN
INTRAMUSCULAR | Status: AC
  Filled 2018-09-03: qty 30

## 2018-09-03 MED ORDER — MIDAZOLAM HCL 2 MG/2ML IJ SOLN
INTRAMUSCULAR | Status: AC
  Filled 2018-09-03: qty 2

## 2018-09-03 MED ORDER — PROPOFOL 10 MG/ML IV BOLUS
INTRAVENOUS | Status: AC
  Filled 2018-09-03: qty 20

## 2018-09-03 MED ORDER — DOCUSATE SODIUM 100 MG PO CAPS
100.0000 mg | ORAL_CAPSULE | Freq: Every day | ORAL | Status: DC
  Administered 2018-09-04 – 2018-09-07 (×3): 100 mg via ORAL
  Filled 2018-09-03 (×3): qty 1

## 2018-09-03 MED ORDER — ONDANSETRON HCL 4 MG/2ML IJ SOLN
4.0000 mg | Freq: Four times a day (QID) | INTRAMUSCULAR | Status: DC | PRN
  Administered 2018-09-05 – 2018-09-25 (×4): 4 mg via INTRAVENOUS
  Filled 2018-09-03 (×4): qty 2

## 2018-09-03 MED ORDER — INSULIN ASPART 100 UNIT/ML ~~LOC~~ SOLN
SUBCUTANEOUS | Status: DC | PRN
  Administered 2018-09-03: 8 [IU] via SUBCUTANEOUS

## 2018-09-03 MED ORDER — HYDRALAZINE HCL 20 MG/ML IJ SOLN: 5.0000 mg | INTRAMUSCULAR | Status: DC | PRN

## 2018-09-03 MED ORDER — ONDANSETRON HCL 4 MG/2ML IJ SOLN
INTRAMUSCULAR | Status: AC
  Filled 2018-09-03: qty 2

## 2018-09-03 MED ORDER — LIDOCAINE 2% (20 MG/ML) 5 ML SYRINGE
INTRAMUSCULAR | Status: AC
  Filled 2018-09-03: qty 5

## 2018-09-03 MED ORDER — MIDAZOLAM HCL 5 MG/5ML IJ SOLN
INTRAMUSCULAR | Status: DC | PRN
  Administered 2018-09-03: 2 mg via INTRAVENOUS

## 2018-09-03 MED ORDER — MORPHINE SULFATE (PF) 2 MG/ML IV SOLN
2.0000 mg | INTRAVENOUS | Status: DC | PRN
  Administered 2018-09-05 – 2018-09-06 (×3): 4 mg via INTRAVENOUS
  Filled 2018-09-03 (×3): qty 2

## 2018-09-03 MED ORDER — ACETAMINOPHEN 325 MG PO TABS: 325.0000 mg | ORAL_TABLET | ORAL | Status: DC | PRN

## 2018-09-03 MED ORDER — CEFAZOLIN SODIUM-DEXTROSE 2-3 GM-%(50ML) IV SOLR
INTRAVENOUS | Status: DC | PRN
  Administered 2018-09-03: 2 g via INTRAVENOUS

## 2018-09-03 MED ORDER — FENTANYL CITRATE (PF) 100 MCG/2ML IJ SOLN
INTRAMUSCULAR | Status: DC | PRN
  Administered 2018-09-03: 100 ug via INTRAVENOUS

## 2018-09-03 MED ORDER — SODIUM CHLORIDE 0.9% IV SOLUTION: Freq: Once | INTRAVENOUS | Status: DC

## 2018-09-03 MED ORDER — CEFAZOLIN SODIUM-DEXTROSE 2-4 GM/100ML-% IV SOLN
2.0000 g | Freq: Three times a day (TID) | INTRAVENOUS | Status: AC
  Administered 2018-09-04: 2 g via INTRAVENOUS
  Filled 2018-09-03 (×2): qty 100

## 2018-09-03 MED ORDER — SUCCINYLCHOLINE CHLORIDE 200 MG/10ML IV SOSY
PREFILLED_SYRINGE | INTRAVENOUS | Status: AC
  Filled 2018-09-03: qty 10

## 2018-09-03 MED ORDER — FENTANYL CITRATE (PF) 100 MCG/2ML IJ SOLN
INTRAMUSCULAR | Status: AC
  Filled 2018-09-03: qty 2

## 2018-09-03 MED ORDER — ONDANSETRON HCL 4 MG/2ML IJ SOLN
4.0000 mg | Freq: Four times a day (QID) | INTRAMUSCULAR | Status: DC | PRN
  Administered 2018-09-04: 4 mg via INTRAVENOUS
  Filled 2018-09-03: qty 2

## 2018-09-03 MED ORDER — ALUM & MAG HYDROXIDE-SIMETH 200-200-20 MG/5ML PO SUSP
15.0000 mL | ORAL | Status: DC | PRN
  Administered 2018-09-05 – 2018-09-06 (×3): 30 mL via ORAL
  Filled 2018-09-03 (×3): qty 30

## 2018-09-03 MED ORDER — SODIUM CHLORIDE 0.9 % IV SOLN
INTRAVENOUS | Status: AC
  Filled 2018-09-03: qty 1.2

## 2018-09-03 MED ORDER — POTASSIUM CHLORIDE CRYS ER 20 MEQ PO TBCR: 20.0000 meq | EXTENDED_RELEASE_TABLET | Freq: Every day | ORAL | Status: DC | PRN

## 2018-09-03 MED ORDER — ROCURONIUM BROMIDE 50 MG/5ML IV SOSY
PREFILLED_SYRINGE | INTRAVENOUS | Status: AC
  Filled 2018-09-03: qty 5

## 2018-09-03 MED ORDER — PROTAMINE SULFATE 10 MG/ML IV SOLN
INTRAVENOUS | Status: AC
  Filled 2018-09-03: qty 5

## 2018-09-03 MED ORDER — SODIUM CHLORIDE 0.9 % IV SOLN
INTRAVENOUS | Status: DC | PRN
  Administered 2018-09-03: 19:00:00

## 2018-09-03 MED ORDER — SODIUM CHLORIDE 0.9 % IV SOLN
INTRAVENOUS | Status: DC | PRN
  Administered 2018-09-03: 20:00:00 via INTRAVENOUS

## 2018-09-03 MED ORDER — FENTANYL CITRATE (PF) 250 MCG/5ML IJ SOLN
INTRAMUSCULAR | Status: AC
  Filled 2018-09-03: qty 5

## 2018-09-03 MED ORDER — PROPOFOL 10 MG/ML IV BOLUS
INTRAVENOUS | Status: DC | PRN
  Administered 2018-09-03: 80 mg via INTRAVENOUS

## 2018-09-03 MED ORDER — IODIXANOL 320 MG/ML IV SOLN
INTRAVENOUS | Status: DC | PRN
  Administered 2018-09-03: 55 mL via INTRA_ARTERIAL

## 2018-09-03 MED ORDER — PHENYLEPHRINE 40 MCG/ML (10ML) SYRINGE FOR IV PUSH (FOR BLOOD PRESSURE SUPPORT)
PREFILLED_SYRINGE | INTRAVENOUS | Status: AC
  Filled 2018-09-03: qty 10

## 2018-09-03 MED ORDER — HEPARIN (PORCINE) 25000 UT/250ML-% IV SOLN
1250.0000 [IU]/h | INTRAVENOUS | Status: DC
  Administered 2018-09-04: 1000 [IU]/h via INTRAVENOUS
  Filled 2018-09-03 (×2): qty 250

## 2018-09-03 MED ORDER — LACTATED RINGERS IV SOLN
INTRAVENOUS | Status: DC
  Administered 2018-09-03: 17:00:00 via INTRAVENOUS

## 2018-09-03 MED ORDER — ROCURONIUM BROMIDE 50 MG/5ML IV SOSY
PREFILLED_SYRINGE | INTRAVENOUS | Status: DC | PRN
  Administered 2018-09-03: 30 mg via INTRAVENOUS

## 2018-09-03 MED ORDER — PANTOPRAZOLE SODIUM 40 MG PO TBEC
40.0000 mg | DELAYED_RELEASE_TABLET | Freq: Every day | ORAL | Status: DC
  Administered 2018-09-04 – 2018-09-07 (×4): 40 mg via ORAL
  Filled 2018-09-03 (×4): qty 1

## 2018-09-03 MED ORDER — POTASSIUM CHLORIDE 10 MEQ/100ML IV SOLN
10.0000 meq | Freq: Once | INTRAVENOUS | Status: AC
  Administered 2018-09-03: 10 meq via INTRAVENOUS

## 2018-09-03 MED ORDER — INSULIN DETEMIR 100 UNIT/ML ~~LOC~~ SOLN
20.0000 [IU] | Freq: Every day | SUBCUTANEOUS | Status: DC
  Administered 2018-09-04 – 2018-09-05 (×2): 20 [IU] via SUBCUTANEOUS
  Filled 2018-09-03 (×4): qty 0.2

## 2018-09-03 MED ORDER — METOPROLOL TARTRATE 5 MG/5ML IV SOLN: 2.0000 mg | INTRAVENOUS | Status: DC | PRN

## 2018-09-03 MED ORDER — SUGAMMADEX SODIUM 200 MG/2ML IV SOLN
INTRAVENOUS | Status: DC | PRN
  Administered 2018-09-03: 200 mg via INTRAVENOUS

## 2018-09-03 MED ORDER — SODIUM CHLORIDE 0.9 % IV SOLN: 500.0000 mL | Freq: Once | INTRAVENOUS | Status: DC | PRN

## 2018-09-03 MED ORDER — ATORVASTATIN CALCIUM 10 MG PO TABS
20.0000 mg | ORAL_TABLET | Freq: Every day | ORAL | Status: DC
  Administered 2018-09-04 – 2018-09-22 (×18): 20 mg via ORAL
  Filled 2018-09-03 (×19): qty 2

## 2018-09-03 MED ORDER — INSULIN ASPART 100 UNIT/ML ~~LOC~~ SOLN
SUBCUTANEOUS | Status: AC
  Filled 2018-09-03: qty 1

## 2018-09-03 MED ORDER — SODIUM CHLORIDE 0.9 % IV SOLN
INTRAVENOUS | Status: DC | PRN
  Administered 2018-09-03: 25 ug/min via INTRAVENOUS

## 2018-09-03 MED ORDER — POTASSIUM CHLORIDE 10 MEQ/100ML IV SOLN
INTRAVENOUS | Status: DC | PRN
  Administered 2018-09-03 (×2): 10 meq via INTRAVENOUS

## 2018-09-03 MED ORDER — POTASSIUM CHLORIDE 10 MEQ/100ML IV SOLN
INTRAVENOUS | Status: AC
  Administered 2018-09-03: 10 meq via INTRAVENOUS
  Filled 2018-09-03: qty 100

## 2018-09-03 MED ORDER — ALBUMIN HUMAN 5 % IV SOLN
INTRAVENOUS | Status: DC | PRN
  Administered 2018-09-03 (×2): via INTRAVENOUS

## 2018-09-03 MED ORDER — AMLODIPINE BESYLATE 10 MG PO TABS: 10.0000 mg | ORAL_TABLET | Freq: Every day | ORAL | Status: DC

## 2018-09-03 MED ORDER — MORPHINE SULFATE (PF) 4 MG/ML IV SOLN
4.0000 mg | Freq: Once | INTRAVENOUS | Status: AC
  Administered 2018-09-03: 4 mg via INTRAVENOUS
  Filled 2018-09-03: qty 1

## 2018-09-03 MED ORDER — HEPARIN (PORCINE) IN NACL 1000-0.9 UT/500ML-% IV SOLN
INTRAVENOUS | Status: AC
  Filled 2018-09-03: qty 1000

## 2018-09-03 MED ORDER — PHENOL 1.4 % MT LIQD: 1.0000 | OROMUCOSAL | Status: DC | PRN

## 2018-09-03 MED ORDER — HEPARIN (PORCINE) IN NACL 1000-0.9 UT/500ML-% IV SOLN
INTRAVENOUS | Status: DC | PRN
  Administered 2018-09-03 (×2): 500 mL

## 2018-09-03 MED ORDER — ACETAMINOPHEN 325 MG RE SUPP
325.0000 mg | RECTAL | Status: DC | PRN
  Filled 2018-09-03: qty 2

## 2018-09-03 MED ORDER — POTASSIUM CHLORIDE 10 MEQ/100ML IV SOLN
10.0000 meq | Freq: Once | INTRAVENOUS | Status: AC
  Administered 2018-09-03: 10 meq via INTRAVENOUS
  Filled 2018-09-03: qty 100

## 2018-09-03 MED ORDER — DEXAMETHASONE SODIUM PHOSPHATE 10 MG/ML IJ SOLN
INTRAMUSCULAR | Status: AC
  Filled 2018-09-03: qty 1

## 2018-09-03 MED ORDER — SODIUM CHLORIDE 0.9 % IV SOLN
INTRAVENOUS | Status: DC
  Administered 2018-09-04 – 2018-09-08 (×7): via INTRAVENOUS

## 2018-09-03 MED ORDER — INSULIN DETEMIR 100 UNIT/ML ~~LOC~~ SOLN
30.0000 [IU] | Freq: Every day | SUBCUTANEOUS | Status: DC
  Administered 2018-09-04 – 2018-09-06 (×2): 30 [IU] via SUBCUTANEOUS
  Filled 2018-09-03 (×4): qty 0.3

## 2018-09-03 MED ORDER — LIDOCAINE HCL (PF) 1 % IJ SOLN
INTRAMUSCULAR | Status: DC | PRN
  Administered 2018-09-03: 10 mL

## 2018-09-03 MED ORDER — SUCCINYLCHOLINE CHLORIDE 20 MG/ML IJ SOLN
INTRAMUSCULAR | Status: DC | PRN
  Administered 2018-09-03: 80 mg via INTRAVENOUS

## 2018-09-03 MED ORDER — PAPAVERINE HCL 30 MG/ML IJ SOLN
INTRAMUSCULAR | Status: AC
  Filled 2018-09-03: qty 2

## 2018-09-03 MED ORDER — LABETALOL HCL 5 MG/ML IV SOLN: 10.0000 mg | INTRAVENOUS | Status: DC | PRN

## 2018-09-03 MED ORDER — OXYCODONE-ACETAMINOPHEN 5-325 MG PO TABS
1.0000 | ORAL_TABLET | ORAL | Status: DC | PRN
  Administered 2018-09-04 – 2018-09-06 (×6): 2 via ORAL
  Filled 2018-09-03 (×6): qty 2

## 2018-09-03 MED ORDER — ACETAMINOPHEN 325 MG PO TABS: 650.0000 mg | ORAL_TABLET | Freq: Four times a day (QID) | ORAL | Status: DC | PRN

## 2018-09-03 MED ORDER — ONDANSETRON HCL 4 MG PO TABS: 4.0000 mg | ORAL_TABLET | Freq: Four times a day (QID) | ORAL | Status: DC | PRN

## 2018-09-03 MED ORDER — MAGNESIUM SULFATE 2 GM/50ML IV SOLN: 2.0000 g | Freq: Every day | INTRAVENOUS | Status: DC | PRN

## 2018-09-03 MED ORDER — GABAPENTIN 300 MG PO CAPS
300.0000 mg | ORAL_CAPSULE | Freq: Two times a day (BID) | ORAL | Status: DC
  Administered 2018-09-04 – 2018-09-07 (×7): 300 mg via ORAL
  Filled 2018-09-03 (×7): qty 1

## 2018-09-03 MED ORDER — INSULIN ASPART 100 UNIT/ML ~~LOC~~ SOLN
0.0000 [IU] | Freq: Three times a day (TID) | SUBCUTANEOUS | Status: DC
  Administered 2018-09-04: 7 [IU] via SUBCUTANEOUS
  Administered 2018-09-06 (×2): 1 [IU] via SUBCUTANEOUS
  Administered 2018-09-07 (×2): 3 [IU] via SUBCUTANEOUS
  Administered 2018-09-09: 5 [IU] via SUBCUTANEOUS
  Administered 2018-09-09: 2 [IU] via SUBCUTANEOUS
  Administered 2018-09-09 – 2018-09-10 (×2): 3 [IU] via SUBCUTANEOUS
  Administered 2018-09-10: 1 [IU] via SUBCUTANEOUS
  Administered 2018-09-11: 3 [IU] via SUBCUTANEOUS
  Administered 2018-09-11: 2 [IU] via SUBCUTANEOUS
  Administered 2018-09-12: 5 [IU] via SUBCUTANEOUS
  Administered 2018-09-12: 7 [IU] via SUBCUTANEOUS
  Administered 2018-09-13 – 2018-09-14 (×6): 3 [IU] via SUBCUTANEOUS
  Administered 2018-09-15: 5 [IU] via SUBCUTANEOUS
  Administered 2018-09-15: 2 [IU] via SUBCUTANEOUS
  Administered 2018-09-16 – 2018-09-18 (×2): 1 [IU] via SUBCUTANEOUS
  Administered 2018-09-19: 5 [IU] via SUBCUTANEOUS

## 2018-09-03 MED ORDER — HEPARIN SODIUM (PORCINE) 1000 UNIT/ML IJ SOLN
INTRAMUSCULAR | Status: DC | PRN
  Administered 2018-09-03: 6500 [IU] via INTRAVENOUS

## 2018-09-03 MED ORDER — CEFAZOLIN SODIUM-DEXTROSE 2-4 GM/100ML-% IV SOLN
2.0000 g | INTRAVENOUS | Status: DC
  Filled 2018-09-03: qty 100

## 2018-09-03 MED ORDER — ONDANSETRON HCL 4 MG/2ML IJ SOLN
INTRAMUSCULAR | Status: DC | PRN
  Administered 2018-09-03: 4 mg via INTRAVENOUS

## 2018-09-03 MED ORDER — FENTANYL CITRATE (PF) 100 MCG/2ML IJ SOLN: 25.0000 ug | INTRAMUSCULAR | Status: DC | PRN

## 2018-09-03 MED ORDER — HEPARIN (PORCINE) 25000 UT/250ML-% IV SOLN
400.0000 [IU]/h | INTRAVENOUS | Status: AC
  Administered 2018-09-03: 400 [IU]/h via INTRAVENOUS
  Filled 2018-09-03 (×3): qty 250

## 2018-09-03 MED ORDER — THROMBIN 5000 UNITS EX SOLR
CUTANEOUS | Status: AC
  Filled 2018-09-03: qty 10000

## 2018-09-03 MED ORDER — GUAIFENESIN-DM 100-10 MG/5ML PO SYRP: 15.0000 mL | ORAL_SOLUTION | ORAL | Status: DC | PRN

## 2018-09-03 MED ORDER — NITROGLYCERIN 0.4 MG SL SUBL: 0.4000 mg | SUBLINGUAL_TABLET | SUBLINGUAL | Status: DC | PRN

## 2018-09-03 SURGICAL SUPPLY — 63 items
BANDAGE ESMARK 6X9 LF (GAUZE/BANDAGES/DRESSINGS) IMPLANT
BNDG ESMARK 6X9 LF (GAUZE/BANDAGES/DRESSINGS)
CANISTER SUCT 3000ML PPV (MISCELLANEOUS) ×4 IMPLANT
CANNULA VESSEL 3MM 2 BLNT TIP (CANNULA) ×12 IMPLANT
CATH EMB 3FR 80CM (CATHETERS) ×4 IMPLANT
CATH EMB 4FR 80CM (CATHETERS) ×4 IMPLANT
CLIP VESOCCLUDE MED 24/CT (CLIP) ×4 IMPLANT
CLIP VESOCCLUDE SM WIDE 24/CT (CLIP) ×4 IMPLANT
COVER WAND RF STERILE (DRAPES) ×4 IMPLANT
CUFF TOURNIQUET SINGLE 18IN (TOURNIQUET CUFF) ×4 IMPLANT
CUFF TOURNIQUET SINGLE 24IN (TOURNIQUET CUFF) IMPLANT
CUFF TOURNIQUET SINGLE 34IN LL (TOURNIQUET CUFF) IMPLANT
CUFF TOURNIQUET SINGLE 44IN (TOURNIQUET CUFF) IMPLANT
DERMABOND ADVANCED (GAUZE/BANDAGES/DRESSINGS) ×2
DERMABOND ADVANCED .7 DNX12 (GAUZE/BANDAGES/DRESSINGS) ×2 IMPLANT
DRAIN CHANNEL 15F RND FF W/TCR (WOUND CARE) ×4 IMPLANT
DRAPE HALF SHEET 40X57 (DRAPES) IMPLANT
DRAPE X-RAY CASS 24X20 (DRAPES) IMPLANT
ELECT REM PT RETURN 9FT ADLT (ELECTROSURGICAL) ×4
ELECTRODE REM PT RTRN 9FT ADLT (ELECTROSURGICAL) ×2 IMPLANT
EVACUATOR SILICONE 100CC (DRAIN) IMPLANT
GAUZE SPONGE 4X4 12PLY STRL LF (GAUZE/BANDAGES/DRESSINGS) ×4 IMPLANT
GLOVE BIO SURGEON STRL SZ7.5 (GLOVE) ×4 IMPLANT
GLOVE BIOGEL PI IND STRL 7.5 (GLOVE) ×2 IMPLANT
GLOVE BIOGEL PI IND STRL 8 (GLOVE) ×4 IMPLANT
GLOVE BIOGEL PI INDICATOR 7.5 (GLOVE) ×2
GLOVE BIOGEL PI INDICATOR 8 (GLOVE) ×4
GLOVE SURG SS PI 7.5 STRL IVOR (GLOVE) ×4 IMPLANT
GOWN STRL REUS W/ TWL LRG LVL3 (GOWN DISPOSABLE) ×6 IMPLANT
GOWN STRL REUS W/TWL LRG LVL3 (GOWN DISPOSABLE) ×6
KIT BASIN OR (CUSTOM PROCEDURE TRAY) ×4 IMPLANT
KIT TURNOVER KIT B (KITS) ×4 IMPLANT
MARKER GRAFT CORONARY BYPASS (MISCELLANEOUS) IMPLANT
NS IRRIG 1000ML POUR BTL (IV SOLUTION) ×8 IMPLANT
PACK PERIPHERAL VASCULAR (CUSTOM PROCEDURE TRAY) ×4 IMPLANT
PAD ARMBOARD 7.5X6 YLW CONV (MISCELLANEOUS) ×8 IMPLANT
PATCH VASC XENOSURE 1CMX6CM (Vascular Products) ×2 IMPLANT
PATCH VASC XENOSURE 1X6 (Vascular Products) ×2 IMPLANT
SET COLLECT BLD 21X3/4 12 (NEEDLE) IMPLANT
SPONGE SURGIFOAM ABS GEL 100 (HEMOSTASIS) IMPLANT
STOPCOCK 4 WAY LG BORE MALE ST (IV SETS) IMPLANT
SUT ETHILON 3 0 PS 1 (SUTURE) ×4 IMPLANT
SUT PROLENE 5 0 C 1 24 (SUTURE) ×8 IMPLANT
SUT PROLENE 6 0 BV (SUTURE) ×8 IMPLANT
SUT SILK 2 0 PERMA HAND 18 BK (SUTURE) ×4 IMPLANT
SUT SILK 2 0 SH (SUTURE) ×4 IMPLANT
SUT SILK 3 0 (SUTURE) ×2
SUT SILK 3-0 18XBRD TIE 12 (SUTURE) ×2 IMPLANT
SUT SILK 4 0 (SUTURE) ×2
SUT SILK 4-0 18XBRD TIE 12 (SUTURE) ×2 IMPLANT
SUT VIC AB 2-0 CTB1 (SUTURE) ×4 IMPLANT
SUT VIC AB 3-0 SH 27 (SUTURE) ×2
SUT VIC AB 3-0 SH 27X BRD (SUTURE) ×2 IMPLANT
SUT VICRYL 4-0 PS2 18IN ABS (SUTURE) ×4 IMPLANT
SYR 3ML LL SCALE MARK (SYRINGE) ×8 IMPLANT
SYRINGE 1CC SLIP TB (MISCELLANEOUS) ×4 IMPLANT
SYRINGE 3CC LL L/F (MISCELLANEOUS) ×8 IMPLANT
TAPE CLOTH SURG 4X10 WHT LF (GAUZE/BANDAGES/DRESSINGS) ×4 IMPLANT
TOWEL GREEN STERILE (TOWEL DISPOSABLE) ×4 IMPLANT
TRAY FOLEY MTR SLVR 16FR STAT (SET/KITS/TRAYS/PACK) ×4 IMPLANT
TUBING EXTENTION W/L.L. (IV SETS) IMPLANT
UNDERPAD 30X30 (UNDERPADS AND DIAPERS) ×4 IMPLANT
WATER STERILE IRR 1000ML POUR (IV SOLUTION) ×4 IMPLANT

## 2018-09-03 SURGICAL SUPPLY — 15 items
CATH ANGIO 5F PIGTAIL 65CM (CATHETERS) ×2 IMPLANT
CATH BEACON 5 .035 65 C2 TIP (CATHETERS) ×2 IMPLANT
CATH CROSS OVER TEMPO 5F (CATHETERS) ×2 IMPLANT
CATH SOFT-VU 4F 65 STRAIGHT (CATHETERS) ×1 IMPLANT
CATH SOFT-VU STRAIGHT 4F 65CM (CATHETERS) ×1
GUIDEWIRE ANGLED .035X150CM (WIRE) ×2 IMPLANT
KIT MICROPUNCTURE NIT STIFF (SHEATH) ×2 IMPLANT
KIT PV (KITS) ×2 IMPLANT
SHEATH BRITE TIP 6FR 35CM (SHEATH) ×2 IMPLANT
SHEATH PINNACLE 5F 10CM (SHEATH) ×2 IMPLANT
SHEATH PROBE COVER 6X72 (BAG) ×2 IMPLANT
SYR MEDRAD MARK V 150ML (SYRINGE) ×2 IMPLANT
TRANSDUCER W/STOPCOCK (MISCELLANEOUS) ×2 IMPLANT
TRAY PV CATH (CUSTOM PROCEDURE TRAY) ×2 IMPLANT
WIRE HITORQ VERSACORE ST 145CM (WIRE) ×2 IMPLANT

## 2018-09-03 NOTE — H&P (Addendum)
History and Physical    Ryan Medina UJW:[id number] DOB: [birth year] DOA: 09/03/2018  PCP: Patient, No Pcp Per  Patient coming from: Home.  Chief Complaint: Left lower extremity pain.  HPI: Ryan Medina is a 57 y.o. male with history of diabetes mellitus type 2 hypertension chronic systolic heart failure cocaine abuse who was recently discharged after being admitted for DKA at that time patient also had some chest pain 2D echo done showed a improvement EF patient has had a cardiac cath in 2017 which was unremarkable presents to the ER because of worsening left lower extremity pain since yesterday around 10 PM.  Patient has been in chronic pain on walking also has abdominal aortic aneurysm.  ED Course: In the ER patient was evaluated by vascular surgeon and was taken for emergency embolectomy.  Patient was started on heparin.  Patient admitted for further management under medical service.  At the time of my exam patient states his lower extremity pain is improved.  Patient states he has been having chest pain off and on for last for few minutes retrosternal pressure-like and admits to taking cocaine 2 days ago when he did have some chest pain.  Denies any shortness of breath.  EKG shows normal sinus rhythm chest x-ray unremarkable.  Review of Systems: As per HPI, rest all negative.   Past Medical History:  Diagnosis Date  . Back pain, chronic   . Diabetes ([place])   . Dilated cardiomyopathy ([place])    Echo 2/18: EF 45-50, diff HK, Gr 1 DD, trivial AI/TR  . HLD (hyperlipidemia)   . Hypertension   . IDDM (insulin dependent diabetes mellitus) ([place])   . Ruptured lumbar disc     Past Surgical History:  Procedure Laterality Date  . ABDOMINAL SURGERY    . APPENDECTOMY    . BACK SURGERY    . CARDIAC CATHETERIZATION N/A 05/19/2016   Procedure: Left Heart Cath and Coronary Angiography;  Surgeon: Leonie Man, MD;  Location: [facility];  Service: Cardiovascular;  Laterality: N/A;      reports that he has been smoking cigarettes. He has a 6.25 pack-year smoking history. He has never used smokeless tobacco. He reports that he does not drink alcohol or use drugs.  Allergies  Allergen Reactions  . Lisinopril Anaphylaxis    Family History  Problem Relation Age of Onset  . Diabetes Mother   . Heart failure Mother   . Diabetes Sister   . Diabetes Brother     Prior to Admission medications   Medication Sig Start Date End Date Taking? Authorizing Provider  amLODipine (NORVASC) 10 MG tablet Take 1 tablet (10 mg total) by mouth daily. 08/25/18   Ghimire, Henreitta Leber, MD  atorvastatin (LIPITOR) 20 MG tablet Take 1 tablet (20 mg total) by mouth daily. 08/25/18   Ghimire, Henreitta Leber, MD  cyclobenzaprine (FLEXERIL) 10 MG tablet Take 1 tablet (10 mg total) by mouth 2 (two) times daily as needed for muscle spasms. 08/25/18   Ghimire, Henreitta Leber, MD  dicyclomine (BENTYL) 20 MG tablet Take 1 tablet (20 mg total) by mouth 2 (two) times daily. 08/25/18   Ghimire, Henreitta Leber, MD  gabapentin (NEURONTIN) 300 MG capsule Take 1 capsule (300 mg total) by mouth 3 (three) times daily. 08/25/18   Ghimire, Henreitta Leber, MD  glucose blood (AGAMATRIX PRESTO TEST) test strip Check blood sugars prior to meals and at bedtime 08/25/18   Ghimire, Henreitta Leber, MD  insulin aspart (NOVOLOG) 100 UNIT/ML  injection Inject 0-15 Units into the skin 3 (three) times daily with meals. 0-15 Units, Subcutaneous, 3 times daily with meals CBG < 70: implement hypoglycemia protocol CBG 70 - 120: 0 units CBG 121 - 150: 2 units CBG 151 - 200: 3 units CBG 201 - 250: 5 units CBG 251 - 300: 8 units CBG 301 - 350: 11 units CBG 351 - 400: 15 units CBG > 400: call MD 08/25/18   Jonetta Osgood, MD  insulin detemir (LEVEMIR) 100 UNIT/ML injection Inject 0.3 mLs (30 Units total) into the skin 2 (two) times daily. 08/25/18   Ghimire, Henreitta Leber, MD  metFORMIN (GLUCOPHAGE) 1000 MG tablet Take 1 tablet (1,000 mg total) by mouth 2 (two)  times daily with a meal. 08/25/18   Ghimire, Henreitta Leber, MD  nitroGLYCERIN (NITROSTAT) 0.4 MG SL tablet Place 1 tablet (0.4 mg total) under the tongue every 5 (five) minutes as needed for chest pain. 08/25/18   Jonetta Osgood, MD    Physical Exam: Vitals:   09/03/18 2115 09/03/18 2130 09/03/18 2145 09/03/18 2207  BP: 101/80 98/75  104/75  Pulse: 87 95 (!) 50 86  Resp: 15 (!) 26 18 18   Temp:   98.4 F (36.9 C) 97.9 F (36.6 C)  TempSrc:    Oral  SpO2: 100% 100% 100% 100%  Weight:      Height:    5\' 11"  (1.803 m)      Constitutional: Moderately built and nourished. Vitals:   09/03/18 2115 09/03/18 2130 09/03/18 2145 09/03/18 2207  BP: 101/80 98/75  104/75  Pulse: 87 95 (!) 50 86  Resp: 15 (!) 26 18 18   Temp:   98.4 F (36.9 C) 97.9 F (36.6 C)  TempSrc:    Oral  SpO2: 100% 100% 100% 100%  Weight:      Height:    5\' 11"  (1.803 m)   Eyes: Anicteric no pallor. ENMT: No discharge from the ears eyes nose or mouth. Neck: No mass felt.  No neck rigidity no JVD appreciated. Respiratory: No rhonchi or crepitations. Cardiovascular: S1-S2 heard. Abdomen: Soft nontender bowel sounds present. Musculoskeletal: Lower extremity left leg is under dressing. Skin: No rash. Neurologic: Alert awake oriented to time place and person.  Moves all extremities. Psychiatric: Appears normal per normal affect.   Labs on Admission: I have personally reviewed following labs and imaging studies  CBC: Recent Labs  Lab 09/03/18 1325 09/03/18 1351 09/03/18 1801 09/03/18 1951  WBC 9.4  --   --   --   NEUTROABS 6.8  --   --   --   HGB 9.8* 10.2* 9.9* 8.2*  HCT 31.9* 30.0* 29.0* 24.0*  MCV 88.9  --   --   --   PLT 378  --   --   --    Basic Metabolic Panel: Recent Labs  Lab 09/03/18 1351 09/03/18 1801 09/03/18 1951  NA 129* 126* 127*  K 2.4* 2.5* 2.9*  CL 81*  --   --   GLUCOSE 216* 291* 281*  BUN 23*  --   --   CREATININE 1.10  --   --    GFR: Estimated Creatinine Clearance:  69.9 mL/min (by C-G formula based on SCr of 1.1 mg/dL). Liver Function Tests: No results for input(s): AST, ALT, ALKPHOS, BILITOT, PROT, ALBUMIN in the last 168 hours. No results for input(s): LIPASE, AMYLASE in the last 168 hours. No results for input(s): AMMONIA in the last 168 hours. Coagulation Profile: Recent  Labs  Lab 09/03/18 1325  INR 1.03   Cardiac Enzymes: No results for input(s): CKTOTAL, CKMB, CKMBINDEX, TROPONINI in the last 168 hours. BNP (last 3 results) No results for input(s): PROBNP in the last 8760 hours. HbA1C: No results for input(s): HGBA1C in the last 72 hours. CBG: Recent Labs  Lab 09/03/18 1615 09/03/18 2054  GLUCAP 261* 243*   Lipid Profile: No results for input(s): CHOL, HDL, LDLCALC, TRIG, CHOLHDL, LDLDIRECT in the last 72 hours. Thyroid Function Tests: No results for input(s): TSH, T4TOTAL, FREET4, T3FREE, THYROIDAB in the last 72 hours. Anemia Panel: No results for input(s): VITAMINB12, FOLATE, FERRITIN, TIBC, IRON, RETICCTPCT in the last 72 hours. Urine analysis:    Component Value Date/Time   COLORURINE STRAW (A) 08/22/2018 2054   APPEARANCEUR CLEAR 08/22/2018 2054   LABSPEC 1.027 08/22/2018 2054   PHURINE 6.0 08/22/2018 2054   GLUCOSEU >=500 (A) 08/22/2018 2054   HGBUR NEGATIVE 08/22/2018 2054   [place] NEGATIVE 08/22/2018 2054   KETONESUR 80 (A) 08/22/2018 2054   PROTEINUR NEGATIVE 08/22/2018 2054   UROBILINOGEN 4.0 (H) 12/05/2016 0921   NITRITE NEGATIVE 08/22/2018 2054   LEUKOCYTESUR NEGATIVE 08/22/2018 2054   Sepsis Labs: @LABRCNTIP (procalcitonin:4,lacticidven:4) )No results found for this or any previous visit (from the past 240 hour(s)).   Radiological Exams on Admission: No results found.  EKG: Independently reviewed.  Normal sinus rhythm.  Nonspecific strategies.  Assessment/Plan Principal Problem:   Popliteal artery embolus (HCC) Active Problems:   DM (diabetes mellitus), type 2, uncontrolled ([place])   Essential  hypertension   Dilated cardiomyopathy (HCC)   Chest pain   AAA (abdominal aortic aneurysm) (HCC)   Ischemic foot   Right leg claudication (HCC)   PVD (peripheral vascular disease) ([place])    1. Popliteal artery embolism status post embolectomy on heparin.  Appreciate Dr. Mee Hives consult.  Further recommendations.  Vascular surgery. 2. Diabetes mellitus type 2 on Levemir 20 units at bedtime and 30 in the morning.  Which has been ordered.  Patient admitted for DKA.  Closely follow CBGs metabolic panel. 3. Chest pain in the setting of cocaine use.  Patient admits to taking cocaine 2 days ago.  Patient has had an unremarkable cardiac cath 2 years ago.  We will cycle cardiac markers.  Recent 2D echo showed EF of 40 to 45%. 4. Hypokalemia -replace and recheck.  Recheck metabolic panel stat.  Check magnesium. 5. Anemia appears to be chronic.  Comparable to recent discharge hemoglobin.  Follow CBC closely. 6. Cocaine abuse -advised to quit using cocaine. 7. Hypertension -blood pressures in the low normal will hold amlodipine and keep patient on PRN IV hydralazine. 8. Chronic systolic heart failure last EF measured 10 days ago was 40 to 45% not on ACE inhibitors due to allergies and also not on beta-blocker due to cocaine abuse.  Appears compensated.   DVT prophylaxis: Heparin. Code Status: Full code. Family Communication: Discussed with patient. Disposition Plan: Home. Consults called: Vascular surgery. Admission status: Inpatient.   [name] MD [facility] Pager [phone].  If 7PM-7AM, please contact night-coverage [email or web address] Password [facility]  09/03/2018, 10:51 PM

## 2018-09-03 NOTE — ED Triage Notes (Signed)
Pt here from home with c/o right side numbness that started yesterday , his arm is better but lower leg is still  A little numb

## 2018-09-03 NOTE — Anesthesia Postprocedure Evaluation (Signed)
Anesthesia Post Note  Patient: Ryan Medina  Procedure(s) Performed: Embolectomy Right Popliteal and Tibial Artery, Bovine Pericardium Patch Angioplasty Right Popliteal Artery (Right Leg Upper)     Patient location during evaluation: PACU Anesthesia Type: General Level of consciousness: awake and alert and awake Pain management: pain level controlled Vital Signs Assessment: post-procedure vital signs reviewed and stable Respiratory status: spontaneous breathing, nonlabored ventilation, respiratory function stable and patient connected to nasal cannula oxygen Cardiovascular status: blood pressure returned to baseline and stable Postop Assessment: no apparent nausea or vomiting Anesthetic complications: no    Last Vitals:  Vitals:   09/03/18 2145 09/03/18 2207  BP:  104/75  Pulse: (!) 50 86  Resp: 18 18  Temp: 36.9 C 36.6 C  SpO2: 100% 100%    Last Pain:  Vitals:   09/03/18 2207  TempSrc: Oral  PainSc:                  Catalina Gravel

## 2018-09-03 NOTE — Op Note (Signed)
NAME: Ryan Medina    MRN: [id number] DOB: November 12, 1960    DATE OF OPERATION: 09/03/2018  PREOP DIAGNOSIS:    Ischemic right lower extremity  POSTOP DIAGNOSIS:    Same with popliteal and tibial embolus  PROCEDURE:    Right popliteal and tibial embolectomy Bovine pericardial patch angioplasty popliteal artery and posterior tibial artery  SURGEON: Judeth Cornfield. Scot Dock, MD, FACS  ASSIST: Arlee Muslim, PA  ANESTHESIA: General  EBL: 100 cc  INDICATIONS:    Ryan Medina is a 57 y.o. male who developed acute ischemia of his right lower extremity last night at 10:30 PM.  He came to the emergency department today and was seen with no Doppler flow in the right foot and decreased motor and sensory function.  He was taken urgently to the operating room for popliteal thrombectomy.  Of note his procedure was slightly delayed and that we had someone with life-threatening bleeding who bumped his case briefly.  FINDINGS:   I was able to pass the Fogarty catheter only short distance down the anterior tibial artery.  I was able to get the catheter down the posterior tibial artery and peroneal arteries.  A large amount of clot was retrieved from all 3 vessels.  In addition there was a large amount of clot retrieved from the popliteal artery.  TECHNIQUE:   The patient was taken to the operating room and received a general anesthetic.  The right lower extremity was prepped and draped in usual sterile fashion.  A longitudinal incision was made along the medial aspect of the right leg.  The dissection was carried down to the below-knee popliteal artery.  This was large and ectatic.  All of his arteries were large and ectatic based on his arteriogram.  I then divided the soleus muscle and divided the anterior tibial vein.  This allowed exposure of the proximal anterior tibial artery which was controlled.  In addition I controlled the peroneal artery.  Finally I controlled the posterior tibial  artery.  The patient was heparinized.  The arteries were all controlled and a longitudinal arteriotomy made in the below-knee popliteal artery.  This was extended onto the posterior tibial artery.  Proximally a 4 Fogarty catheter was passed multiple times and a large amount of organized clot was retrieved.  Once no further clot was retrieved the artery was flushed with heparinized saline and clamped.  Next a 4 Fogarty catheter was passed down the anterior tibial artery.  This would only pass about 20 cm and appeared to be occluded at that level.  I was able to pass the Fogarty down the peroneal artery about 30 cm.  A large amount of clot was retrieved.  The cath was passed until no further clot was retrieved.  I was able to pass the catheter down the posterior tibial artery for 50 cm.  Again a large amount of clot was retrieved multiple passes were made until no further clot was retrieved.  The arteries were irrigated with heparinized saline.  A bovine pericardial patch was then sewn using continuous 6-0 Prolene suture.  Prior to completing the closure the arteries were backbled and flushed appropriately and the anastomosis completed.  At the completion there was a brisk posterior tibial signal with the Doppler and a monophasic anterior tibial and peroneal signals.  A 15 Blake drain was placed.  Hemostasis was obtained in the wound.  I did not reverse the heparin.  The wound was closed with a deep layer of  3-0 Vicryl and the skin closed with 4-0 Vicryl.  A dressing was applied.  The patient tolerated the procedure well was transferred to recovery room in stable condition.  All needle and sponge counts were correct.  [name], MD, FACS [facility]  DATE OF DICTATION:   09/03/2018

## 2018-09-03 NOTE — Anesthesia Procedure Notes (Signed)
Procedure Name: Intubation Date/Time: 09/03/2018 6:56 PM Performed by: Babs Bertin, CRNA Pre-anesthesia Checklist: Patient identified, Emergency Drugs available, Suction available and Patient being monitored Patient Re-evaluated:Patient Re-evaluated prior to induction Oxygen Delivery Method: Circle System Utilized Preoxygenation: Pre-oxygenation with 100% oxygen Induction Type: IV induction Ventilation: Mask ventilation without difficulty Laryngoscope Size: Mac and 3 Grade View: Grade II Tube type: Oral Tube size: 7.5 mm Number of attempts: 1 Airway Equipment and Method: Stylet and Oral airway Placement Confirmation: ETT inserted through vocal cords under direct vision,  positive ETCO2 and breath sounds checked- equal and bilateral Secured at: 22 cm Tube secured with: Tape Dental Injury: Teeth and Oropharynx as per pre-operative assessment

## 2018-09-03 NOTE — Op Note (Signed)
   PATIENT: Ryan Medina      MRN: [id number] DOB: 24-Sep-1960    DATE OF PROCEDURE: 09/03/2018  INDICATIONS:    Ryan Medina is a 57 y.o. male who presented with a sudden onset of pain and paresthesias in the right lower extremity at 10:30 PM last night.  He had no Doppler flow in the right foot and he was taken urgently for arteriography.  PROCEDURE:    1.  Ultrasound-guided access to the left common femoral artery 2.  Aortogram with bilateral iliac arteriogram 3.  Selective catheterization of the right external iliac artery with right lower extremity runoff  SURGEON: Judeth Cornfield. Scot Dock, MD, FACS  ANESTHESIA: Local  EBL: Minimal  TECHNIQUE: The patient was taken to the peripheral vascular lab.  I did not give sedation.  Both groins were prepped and draped in the usual sterile fashion.  Under ultrasound guidance, after the skin was anesthetized, I cannulated the left common femoral artery with a micropuncture needle and a micropuncture sheath was introduced over a wire.  This was exchanged for a 5 [place] sheath over a Bentson wire.  By ultrasound the femoral artery was patent. A real-time image was obtained and placed in the chart.   A pigtail catheter was positioned at the L1 vertebral body and flush aortogram obtained.  I then changed the 5 French sheath in the left groin to a long 6 [place] sheath would provide better support as there was significant tortuosity in the iliac arteries.  I was able to cannulate the right common iliac artery using a C1 catheter and advanced a Glidewire into the external iliac artery.  This catheter was then exchanged for a straight catheter and selective right external iliac arteriogram was obtained with right lower extremity runoff.  This demonstrated occlusion of the popliteal artery with embolus.  I did not think it was a candidate for mechanical thrombolysis given the tortuosity of his iliacs.  He will be taken urgently to the operating room for  attempted surgical thrombectomy.  FINDINGS:   1.  Abdominal aortic aneurysm and bilateral common iliac artery aneurysms as previously documented on CT scan.  Significant tortuosity and ectasia of both external iliac arteries. 2.  On the right side, which is the symptomatic side, the common femoral, deep femoral, and superficial femoral arteries are large and ectatic.  There is an abrupt occlusion of the popliteal artery with what appears to be embolic disease.  Below that there is poor visualization of any tibial vessels except perhaps a small peroneal.  CLINICAL NOTE: The patient will be taken urgently to the operating room for attempted popliteal embolectomy.  [name], MD, FACS [facility]  DATE OF DICTATION:   09/03/2018

## 2018-09-03 NOTE — Transfer of Care (Signed)
Immediate Anesthesia Transfer of Care Note  Patient: Ryan Medina  Procedure(s) Performed: Embolectomy Right Popliteal and Tibial Artery, Bovine Pericardium Patch Angioplasty Right Popliteal Artery (Right Leg Upper)  Patient Location: PACU  Anesthesia Type:General  Level of Consciousness: awake, alert  and oriented  Airway & Oxygen Therapy: Patient Spontanous Breathing  Post-op Assessment: Report given to RN and Post -op Vital signs reviewed and stable  Post vital signs: Reviewed and stable  Last Vitals:  Vitals Value Taken Time  BP 105/81 09/03/2018  8:53 PM  Temp    Pulse 92 09/03/2018  8:59 PM  Resp 18 09/03/2018  8:59 PM  SpO2 100 % 09/03/2018  8:59 PM  Vitals shown include unvalidated device data.  Last Pain:  Vitals:   09/03/18 1614  TempSrc:   PainSc: 9          Complications: No apparent anesthesia complications

## 2018-09-03 NOTE — Progress Notes (Signed)
ANTICOAGULATION CONSULT NOTE - Initial Consult  Pharmacy Consult for Heparin  Indication: LLE DVT  Allergies  Allergen Reactions  . Lisinopril Anaphylaxis    Patient Measurements: Height: 5\' 11"  (180.3 cm) Weight: 147 lb 0.8 oz (66.7 kg) IBW/kg (Calculated) : 75.3 Heparin Dosing Weight: 66.7 kg   Vital Signs: Temp: 97.9 F (36.6 C) (12/13 1252) Temp Source: Oral (12/13 1252) BP: 105/71 (12/13 1615) Pulse Rate: 100 (12/13 1615)  Labs: Recent Labs    09/03/18 1325 09/03/18 1351 09/03/18 1801  HGB 9.8* 10.2* 9.9*  HCT 31.9* 30.0* 29.0*  PLT 378  --   --   LABPROT 13.4  --   --   INR 1.03  --   --   CREATININE  --  1.10  --     Estimated Creatinine Clearance: 69.9 mL/min (by C-G formula based on SCr of 1.1 mg/dL).   Medical History: Past Medical History:  Diagnosis Date  . Back pain, chronic   . Diabetes ([place])   . Dilated cardiomyopathy ([place])    Echo 2/18: EF 45-50, diff HK, Gr 1 DD, trivial AI/TR  . HLD (hyperlipidemia)   . Hypertension   . IDDM (insulin dependent diabetes mellitus) ([place])   . Ruptured lumbar disc     Medications:  Infusions:  . heparin    . lactated ringers Stopped (09/03/18 1935)    Assessment: 29 YOM who presented on 12/13 with RLE pain/numbness and found to have RLE DVT and limb ischemia s/p urgent arteriography and popliteal embolectomy. Heparin started intra-op at a rate of 400 units/hr. Pharmacy consulted to transition to full dose Heparin without a bolus starting at 0230 on 12/14.   Hep Wt: 66.7 kg, Hgb 9.9, plts wnl.   Goal of Therapy:  Heparin level 0.3-0.7 units/ml Monitor platelets by anticoagulation protocol: Yes   Plan:  - Increase Heparin to 1000 units/hr (10 ml/hr) starting at 0230 on 12/14 - Daily HL, CBC - Will continue to monitor for any signs/symptoms of bleeding and will follow up with heparin level in 6 hours after increasing the drip rate  Thank you for allowing pharmacy to be a part of this patient's  care.  Alycia Rossetti, PharmD, BCPS Clinical Pharmacist Please check AMION for all [place] numbers 09/03/2018 8:48 PM

## 2018-09-03 NOTE — ED Provider Notes (Signed)
[facility] EMERGENCY DEPARTMENT Provider Note   CSN: [id number] Arrival date & time: 09/03/18  1242     History   Chief Complaint Chief Complaint  Patient presents with  . Numbness    HPI Ryan Medina is a 57 y.o. male.  The history is provided by the patient and medical records. No language interpreter was used.     57 year old male with history of chronic back pain, diabetes, hypertension, dilated cardiomyopathy presenting with complaints of right-leg numbness.  Patient report having throbbing burning sensation to his right thigh radiates down his right leg that started since yesterday.  Pain is been persistent and now he states he can feel his leg and it is cooler than usual.  He denies having similar symptoms like this in the past.  Symptoms moderate in severity, no report of fever, chills, lightheadedness, dizziness, chest pain, trouble breathing, back pain, bowel bladder incontinence or saddle anesthesia.  He is a smoker but states that he cut down significantly.  He denies any specific treatment tried.  He denies any recent injury.  He report having difficulty walking due to his pain and numbness.  Past Medical History:  Diagnosis Date  . Back pain, chronic   . Diabetes ([place])   . Dilated cardiomyopathy ([place])    Echo 2/18: EF 45-50, diff HK, Gr 1 DD, trivial AI/TR  . HLD (hyperlipidemia)   . Hypertension   . IDDM (insulin dependent diabetes mellitus) ([place])   . Ruptured lumbar disc     Patient Active Problem List   Diagnosis Date Noted  . HLD (hyperlipidemia) 08/22/2018  . DKA (diabetic ketoacidoses) ([place]) 08/22/2018  . AKI (acute kidney injury) ([place]) 08/22/2018  . Abdominal pain 08/22/2018  . Hyperosmolar non-ketotic state in patient with type 2 diabetes mellitus ([place]) 01/30/2018  . Chest pain 01/30/2018  . AAA (abdominal aortic aneurysm) ([place]) 01/30/2018  . Tobacco abuse 01/30/2018  . Nausea & vomiting 01/30/2018  . Diabetes mellitus due to  underlying condition with hyperosmolarity without nonketotic hyperglycemic-hyperosmolar coma [facility]) ([place]) 01/30/2018  . Dilated cardiomyopathy ([place])   . Other hyperlipidemia 11/11/2016  . Abnormal nuclear stress test 05/19/2016  . Atypical angina ([place]) 04/25/2016  . DM (diabetes mellitus), type 2, uncontrolled ([place]) 03/14/2015  . Essential hypertension 03/14/2015  . Hyperglycemia 01/14/2015    Past Surgical History:  Procedure Laterality Date  . ABDOMINAL SURGERY    . APPENDECTOMY    . BACK SURGERY    . CARDIAC CATHETERIZATION N/A 05/19/2016   Procedure: Left Heart Cath and Coronary Angiography;  Surgeon: Leonie Man, MD;  Location: [facility] CV LAB;  Service: Cardiovascular;  Laterality: N/A;        Home Medications    Prior to Admission medications   Medication Sig Start Date End Date Taking? Authorizing Provider  amLODipine (NORVASC) 10 MG tablet Take 1 tablet (10 mg total) by mouth daily. 08/25/18   Ghimire, Henreitta Leber, MD  atorvastatin (LIPITOR) 20 MG tablet Take 1 tablet (20 mg total) by mouth daily. 08/25/18   Ghimire, Henreitta Leber, MD  cyclobenzaprine (FLEXERIL) 10 MG tablet Take 1 tablet (10 mg total) by mouth 2 (two) times daily as needed for muscle spasms. 08/25/18   Ghimire, Henreitta Leber, MD  dicyclomine (BENTYL) 20 MG tablet Take 1 tablet (20 mg total) by mouth 2 (two) times daily. 08/25/18   Ghimire, Henreitta Leber, MD  gabapentin (NEURONTIN) 300 MG capsule Take 1 capsule (300 mg total) by mouth 3 (three) times daily.  08/25/18   Ghimire, Henreitta Leber, MD  glucose blood (AGAMATRIX PRESTO TEST) test strip Check blood sugars prior to meals and at bedtime 08/25/18   Ghimire, Henreitta Leber, MD  insulin aspart (NOVOLOG) 100 UNIT/ML injection Inject 0-15 Units into the skin 3 (three) times daily with meals. 0-15 Units, Subcutaneous, 3 times daily with meals CBG < 70: implement hypoglycemia protocol CBG 70 - 120: 0 units CBG 121 - 150: 2 units CBG 151 - 200: 3 units CBG 201 - 250: 5  units CBG 251 - 300: 8 units CBG 301 - 350: 11 units CBG 351 - 400: 15 units CBG > 400: call MD 08/25/18   Jonetta Osgood, MD  insulin detemir (LEVEMIR) 100 UNIT/ML injection Inject 0.3 mLs (30 Units total) into the skin 2 (two) times daily. 08/25/18   Ghimire, Henreitta Leber, MD  metFORMIN (GLUCOPHAGE) 1000 MG tablet Take 1 tablet (1,000 mg total) by mouth 2 (two) times daily with a meal. 08/25/18   Ghimire, Henreitta Leber, MD  nitroGLYCERIN (NITROSTAT) 0.4 MG SL tablet Place 1 tablet (0.4 mg total) under the tongue every 5 (five) minutes as needed for chest pain. 08/25/18   Ghimire, Henreitta Leber, MD    Family History Family History  Problem Relation Age of Onset  . Diabetes Mother   . Heart failure Mother   . Diabetes Sister   . Diabetes Brother     Social History Social History   Tobacco Use  . Smoking status: Current Every Day Smoker    Packs/day: 0.25    Years: 25.00    Pack years: 6.25    Types: Cigarettes  . Smokeless tobacco: Never Used  . Tobacco comment: 2 per day   Substance Use Topics  . Alcohol use: No    Frequency: Never  . Drug use: No     Allergies   Lisinopril   Review of Systems Review of Systems  All other systems reviewed and are negative.    Physical Exam Updated Vital Signs BP 93/74   Pulse 95   Temp 97.9 F (36.6 C) (Oral)   Resp 18   SpO2 100%   Physical Exam Constitutional:      General: He is not in acute distress.    Appearance: He is well-developed.  HENT:     Head: Atraumatic.  Eyes:     Conjunctiva/sclera: Conjunctivae normal.  Neck:     Musculoskeletal: Normal range of motion and neck supple.  Cardiovascular:     Rate and Rhythm: Normal rate and regular rhythm.  Pulmonary:     Effort: Pulmonary effort is normal.     Breath sounds: Normal breath sounds.  Abdominal:     Palpations: Abdomen is soft.     Tenderness: There is no abdominal tenderness.  Musculoskeletal:        General: Tenderness (Mild tenderness about the entire  right lower extremity.  Right foot is colder than left foot.  Difficult to palpate pedal pulses on the right side, cap refill is less than 2-second.  Patellar deep tendon reflex is intact.  Decreased RLE strength to LLE) present. No swelling.  Skin:    Findings: No rash.  Neurological:     Mental Status: He is alert and oriented to person, place, and time.  Psychiatric:        Mood and Affect: Mood normal.        Behavior: Behavior normal.      ED Treatments / Results  Labs (all labs ordered  are listed, but only abnormal results are displayed) Labs Reviewed  I-STAT CHEM 8, ED - Abnormal; Notable for the following components:      Result Value   Sodium 129 (*)    Potassium 2.4 (*)    Chloride 81 (*)    BUN 23 (*)    Glucose, Bld 216 (*)    Calcium, Ion 0.86 (*)    TCO2 36 (*)    Hemoglobin 10.2 (*)    HCT 30.0 (*)    All other components within normal limits  CBC WITH DIFFERENTIAL/PLATELET  PROTIME-INR    EKG EKG Interpretation  Date/Time:  Friday September 03 2018 13:30:09 EST Ventricular Rate:  90 PR Interval:    QRS Duration: 94 QT Interval:  389 QTC Calculation: 476 R Axis:   84 Text Interpretation:  Sinus rhythm Borderline repolarization abnormality Borderline prolonged QT interval Abnormal ekg Confirmed by Carmin Muskrat [phone]) on 09/03/2018 1:46:06 PM   Radiology No results found.  Procedures .Critical Care Performed by: Domenic Moras, PA-C Authorized by: Domenic Moras, PA-C   Critical care provider statement:    Critical care time (minutes):  45   Critical care was time spent personally by me on the following activities:  Discussions with consultants, evaluation of patient's response to treatment, examination of patient, ordering and performing treatments and interventions, ordering and review of laboratory studies, ordering and review of radiographic studies, pulse oximetry, re-evaluation of patient's condition, obtaining history from patient or surrogate  and review of old charts   (including critical care time)  Medications Ordered in ED Medications  potassium chloride 10 mEq in 100 mL IVPB ( Intravenous MAR Hold 09/03/18 1507)  morphine 4 MG/ML injection 4 mg (4 mg Intravenous Given 09/03/18 1359)     Initial Impression / Assessment and Plan / ED Course  I have reviewed the triage vital signs and the nursing notes.  Pertinent labs & imaging results that were available during my care of the patient were reviewed by me and considered in my medical decision making (see chart for details).     BP 93/74   Pulse 95   Temp 97.9 F (36.6 C) (Oral)   Resp 18   SpO2 100%    Final Clinical Impressions(s) / ED Diagnoses   Final diagnoses:  Right leg claudication (HCC)  Nontraumatic ischemic infarction of muscle of right lower leg    ED Discharge Orders    None     1:34 PM Patient here with complaints of numbness and coolness to his right lower extremity with pain to his right thigh.  On exam, I am unable to appreciate right dorsalis pedis or posterior tibialis pulse.  His foot is cool to the touch.  He does have intact right popliteal pulse.  Patella DTR intact, no foot drop.  He has difficulty ambulating.  No back pain.  Negative straight leg raise.  Will initiate blood work, obtain ABI but will consult vascular surgeon promptly.  Care discussed with Dr. Vanita Panda.   1:47 PM I reviewed prior charts, in 04/29/2017, patient was evaluated for low back pain and an MRI was performed incidentally found the patient has a 3.2 cm abdominal aortic aneurysm and also bilateral common iliac artery aneurysm.  2:02 PM Appreciate consultation from Vascular Surgeon Dr. Scot Dock who will see pt in the ER.  Pt given pain medication, made NPO, work up initiated.   2:24 PM Vascular surgeon Dr. Scot Dock has seen and evaluated pt and would like to perform arteriogram  of RLE for further evaluation if OR slot is available.  If not, then likely CT scand with  run-offs.  Pt's lab is remarkable for hypokalemia with K+ 2.4. No U-waves.  Since pt is NPO, will replenish K+ via IV route.    Dr. Vanita Panda mentioned Dr. Scot Dock request medicine for admission of this patient. Pt however is off the floor and currently in PACU for   4:00 PM I did consult [facility] Dr. Nehemiah Settle who agrees to help with admission but he would like to be notified once pt is ready for admission. I will reach out to vascular surgeon Dr. Scot Dock to help coordinate admission.   4:06 PM I have reached out to vascular surgeon Dr. Scot Dock who will contact hospitalist Dr. Nehemiah Settle for admission once pt is out of surgery.     Domenic Moras, PA-C 09/03/18 1606    Carmin Muskrat, MD 09/09/18 [phone]

## 2018-09-03 NOTE — Consult Note (Addendum)
REASON FOR CONSULT:    Ischemic right foot.  The consult is requested by the emergency department.  HPI:   Ryan Medina is a pleasant 57 y.o. male, who presents with pain and numbness in the right foot which began at 10:30 PM last night.  Prior to this event he did note right calf claudication which he has had since September.  He experiences pain in the right calf which is brought on by ambulation and relieved with rest.  He denies any significant hip or thigh claudication on the right and has had no significant symptoms on the left side.  He also states that he has been having rest pain in the right foot for 4 to 6 weeks.  He denies any history of nonhealing wounds.  He has no history of atrial fibrillation or recent myocardial infarction.  His risk factors for peripheral vascular disease include diabetes, hypertension, hypercholesterolemia, and tobacco use.  He denies any family history of premature cardiovascular disease.  Of note the patient states that he has had intermittent chest pressure which she occasionally takes nitroglycerin for.  He does not have a primary care physician or a cardiologist.  Of note, I saw him in consultation in August 2018 with a small abdominal aortic aneurysm and bilateral iliac artery aneurysms.  The patient has a history of lumbar disc disease and underwent an MRI for this reason.  An incidental finding was a 3.2 cm infrarenal abdominal aortic aneurysm and bilateral common iliac artery aneurysms.  Back at that time he had palpable pedal pulses bilaterally.  Duplex of his aorta showed a 3.3 cm infrarenal abdominal aortic aneurysm.  The right common iliac artery measures 3.2 cm in maximum diameter.  The left common iliac artery measures 2.6 cm in maximum diameter.  At that time we discussed the importance of tobacco cessation.  I recommended a follow-up duplex scan in 1 year and plan to see him back at that time.  He did not return for that follow-up visit.  Of  note, the patient is also been complaining of being unable to keep anything down.  He states that food gets caught in his throat and he throws up.  Past Medical History:  Diagnosis Date  . Back pain, chronic   . Diabetes ([place])   . Dilated cardiomyopathy ([place])    Echo 2/18: EF 45-50, diff HK, Gr 1 DD, trivial AI/TR  . HLD (hyperlipidemia)   . Hypertension   . IDDM (insulin dependent diabetes mellitus) ([place])   . Ruptured lumbar disc     Family History  Problem Relation Age of Onset  . Diabetes Mother   . Heart failure Mother   . Diabetes Sister   . Diabetes Brother   He denies any family history of premature cardiovascular disease.   SOCIAL HISTORY: He has been smoking since he was 57 years old.  He had smoked a half a pack per day for many years but has cut back to 2 cigarettes a day. Social History   Socioeconomic History  . Marital status: Single    Spouse name: Not on file  . Number of children: Not on file  . Years of education: Not on file  . Highest education level: Not on file  Occupational History  . Not on file  Social Needs  . Financial resource strain: Not on file  . Food insecurity:    Worry: Not on file    Inability: Not on file  . Transportation needs:  Medical: Not on file    Non-medical: Not on file  Tobacco Use  . Smoking status: Current Every Day Smoker    Packs/day: 0.25    Years: 25.00    Pack years: 6.25    Types: Cigarettes  . Smokeless tobacco: Never Used  . Tobacco comment: 2 per day   Substance and Sexual Activity  . Alcohol use: No    Frequency: Never  . Drug use: No  . Sexual activity: Yes  Lifestyle  . Physical activity:    Days per week: Not on file    Minutes per session: Not on file  . Stress: Not on file  Relationships  . Social connections:    Talks on phone: Not on file    Gets together: Not on file    Attends religious service: Not on file    Active member of club or organization: Not on file    Attends meetings of  clubs or organizations: Not on file    Relationship status: Not on file  . Intimate partner violence:    Fear of current or ex partner: Not on file    Emotionally abused: Not on file    Physically abused: Not on file    Forced sexual activity: Not on file  Other Topics Concern  . Not on file  Social History Narrative  . Not on file    Allergies  Allergen Reactions  . Lisinopril Anaphylaxis    Current Facility-Administered Medications  Medication Dose Route Frequency Provider Last Rate Last Dose  . potassium chloride 10 mEq in 100 mL IVPB  10 mEq Intravenous Once Domenic Moras, PA-C       Current Outpatient Medications  Medication Sig Dispense Refill  . amLODipine (NORVASC) 10 MG tablet Take 1 tablet (10 mg total) by mouth daily. 30 tablet 0  . atorvastatin (LIPITOR) 20 MG tablet Take 1 tablet (20 mg total) by mouth daily. 30 tablet 0  . cyclobenzaprine (FLEXERIL) 10 MG tablet Take 1 tablet (10 mg total) by mouth 2 (two) times daily as needed for muscle spasms. 20 tablet 0  . dicyclomine (BENTYL) 20 MG tablet Take 1 tablet (20 mg total) by mouth 2 (two) times daily. 20 tablet 0  . gabapentin (NEURONTIN) 300 MG capsule Take 1 capsule (300 mg total) by mouth 3 (three) times daily. 90 capsule 0  . glucose blood (AGAMATRIX PRESTO TEST) test strip Check blood sugars prior to meals and at bedtime 100 each 0  . insulin aspart (NOVOLOG) 100 UNIT/ML injection Inject 0-15 Units into the skin 3 (three) times daily with meals. 0-15 Units, Subcutaneous, 3 times daily with meals CBG < 70: implement hypoglycemia protocol CBG 70 - 120: 0 units CBG 121 - 150: 2 units CBG 151 - 200: 3 units CBG 201 - 250: 5 units CBG 251 - 300: 8 units CBG 301 - 350: 11 units CBG 351 - 400: 15 units CBG > 400: call MD 20 mL 0  . insulin detemir (LEVEMIR) 100 UNIT/ML injection Inject 0.3 mLs (30 Units total) into the skin 2 (two) times daily. 20 mL 0  . metFORMIN (GLUCOPHAGE) 1000 MG tablet Take 1 tablet (1,000 mg  total) by mouth 2 (two) times daily with a meal. 60 tablet 0  . nitroGLYCERIN (NITROSTAT) 0.4 MG SL tablet Place 1 tablet (0.4 mg total) under the tongue every 5 (five) minutes as needed for chest pain. 30 tablet 0    REVIEW OF SYSTEMS:  [X]  denotes positive finding, [ ]   denotes negative finding Cardiac  Comments:  Chest pain or chest pressure: x   Shortness of breath upon exertion: x   Short of breath when lying flat:    Irregular heart rhythm:        Vascular    Pain in calf, thigh, or hip brought on by ambulation: x  Right calf  Pain in feet at night that wakes you up from your sleep:  x  right foot  Blood clot in your veins:    Leg swelling:         Pulmonary    Oxygen at home:    Productive cough:     Wheezing:         Neurologic    Sudden weakness in arms or legs:     Sudden numbness in arms or legs:  x  right foot  Sudden onset of difficulty speaking or slurred speech:    Temporary loss of vision in one eye:     Problems with dizziness:         Gastrointestinal    Blood in stool:     Vomited blood:         Genitourinary    Burning when urinating:     Blood in urine:        Psychiatric    Major depression:         Hematologic    Bleeding problems:    Problems with blood clotting too easily:        Skin    Rashes or ulcers:        Constitutional    Fever or chills:     PHYSICAL EXAM:   Vitals:   09/03/18 1252  BP: 93/74  Pulse: 95  Resp: 18  Temp: 97.9 F (36.6 C)  TempSrc: Oral  SpO2: 100%   GENERAL: The patient is a well-nourished male, in no acute distress. The vital signs are documented above. CARDIAC: There is a regular rate and rhythm.  VASCULAR: I do not detect carotid bruits. On the right side, which is the symptomatic side, he has a palpable femoral pulse.  I cannot palpate a popliteal or pedal pulses.  I cannot obtain Doppler signals in the right foot.  The right foot is cooler than the left. On the left side he has a palpable femoral  and popliteal pulse.  He has a biphasic posterior tibial signal on the left with the Doppler and a monophasic DP signal.  He has no significant lower extremity swelling. PULMONARY: There is good air exchange bilaterally without wheezing or rales. ABDOMEN: Soft and non-tender with normal pitched bowel sounds.  MUSCULOSKELETAL: There are no major deformities or cyanosis. NEUROLOGIC: He has decreased motor and sensory function in the right foot. SKIN: There are no ulcers or rashes noted. PSYCHIATRIC: The patient has a normal affect.  DATA:    LABS: Potassium is 2.4.  This is being supplemented by the emergency department.  Creatinine is 1.1.  Sodium is 129.  Hemoglobin 10.2.  Glucose 216.  CT RENAL STONE STUDY: I reviewed this patient's CT renal stone study that was done on 08/22/2018.  This showed that the distal abdominal aortic measure 3 cm in maximum diameter.  The right common iliac artery measured 2.7 cm in maximum diameter and the left common iliac artery 2.6 cm in maximum diameter.  These had not changed significantly compared to the study back in August 2018.  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  He developed acute change in his symptoms last night at 10:30 PM.  He could have thrombosis on top of underlying infrainguinal disease.  The differential diagnosis would also include embolic disease possibly related to his proximal aneurysms or possibly a thrombosed popliteal artery aneurysm.  I have recommended that we proceed urgently with arteriography given that he has decreased motor and sensory function and likely has significant infrainguinal arterial occlusive disease.  I have discussed the indications for the procedure and the potential complication with the patient and he is agreeable to proceed.  We will make further recommendations pending these results.  I explained that if we  find disease amenable to angioplasty this could potentially be addressed at the same time.  HYPOKALEMIA: This is being supplemented by the emergency department.  This patient had hypokalemia during the previous admission which was repleted.  The patient also has a history of chronic systolic heart failure.  CHEST PAIN: The patient has been having intermittent chest pressure and has taken nitroglycerin in the past for chest pressure.  He currently does not have a cardiologist.  I suspect he will need cardiac work-up.  Of note the patient was recently discharged from the hospital on 08/25/2018.  During that admission he had an abnormal EKG with new diffuse T wave inversions.  Stress echo showed an ejection fraction of 40 to 45% which was improved compared to a previous study.  During that admission the patient was also counseled extensively on the importance of discontinuing cocaine use.  Apparently this had been an ongoing problem.  NAUSEA AND VOMITING: Apparently he has been having a hard time keeping food down.  He tells me that it feels like food gets stuck in his throat.  Of note, his CT renal stone study in December of this year showed gastric distention of uncertain etiology.  DIABETES: This patient has a history of insulin-dependent type 2 diabetes which has not been under good control.  His most recent in admission was for DKA which resolved with IV fluids and IV insulin transition back to subcu insulin.    [name] [facility] [phone]

## 2018-09-03 NOTE — Anesthesia Preprocedure Evaluation (Signed)
Anesthesia Evaluation  Patient identified by MRN, date of birth, ID band Patient awake    Reviewed: Allergy & Precautions, NPO status , Patient's Chart, lab work & pertinent test results  Airway Mallampati: II  TM Distance: >3 FB     Dental   Pulmonary Current Smoker,    breath sounds clear to auscultation       Cardiovascular hypertension, + angina  Rhythm:Regular Rate:Normal     Neuro/Psych    GI/Hepatic negative GI ROS, Neg liver ROS,   Endo/Other  diabetes  Renal/GU Renal disease     Musculoskeletal   Abdominal   Peds  Hematology   Anesthesia Other Findings   Reproductive/Obstetrics                             Anesthesia Physical Anesthesia Plan  ASA: III  Anesthesia Plan: General   Post-op Pain Management:    Induction: Intravenous  PONV Risk Score and Plan: 2 and Ondansetron, Dexamethasone and Midazolam  Airway Management Planned: Oral ETT  Additional Equipment:   Intra-op Plan:   Post-operative Plan: Possible Post-op intubation/ventilation  Informed Consent: I have reviewed the patients History and Physical, chart, labs and discussed the procedure including the risks, benefits and alternatives for the proposed anesthesia with the patient or authorized representative who has indicated his/her understanding and acceptance.   Dental advisory given  Plan Discussed with: CRNA and Anesthesiologist  Anesthesia Plan Comments:         Anesthesia Quick Evaluation

## 2018-09-04 ENCOUNTER — Inpatient Hospital Stay (HOSPITAL_COMMUNITY): Payer: Medicaid Other

## 2018-09-04 DIAGNOSIS — I340 Nonrheumatic mitral (valve) insufficiency: Secondary | ICD-10-CM

## 2018-09-04 LAB — GLUCOSE, CAPILLARY
GLUCOSE-CAPILLARY: 86 mg/dL (ref 70–99)
Glucose-Capillary: 116 mg/dL — ABNORMAL HIGH (ref 70–99)
Glucose-Capillary: 305 mg/dL — ABNORMAL HIGH (ref 70–99)
Glucose-Capillary: 58 mg/dL — ABNORMAL LOW (ref 70–99)
Glucose-Capillary: 74 mg/dL (ref 70–99)
Glucose-Capillary: 87 mg/dL (ref 70–99)
Glucose-Capillary: 93 mg/dL (ref 70–99)

## 2018-09-04 LAB — BASIC METABOLIC PANEL
Anion gap: 9 (ref 5–15)
BUN: 21 mg/dL — ABNORMAL HIGH (ref 6–20)
CO2: 40 mmol/L — ABNORMAL HIGH (ref 22–32)
Calcium: 7.9 mg/dL — ABNORMAL LOW (ref 8.9–10.3)
Chloride: 84 mmol/L — ABNORMAL LOW (ref 98–111)
Creatinine, Ser: 1.12 mg/dL (ref 0.61–1.24)
GFR calc Af Amer: >60 mL/min (ref >60)
GFR calc non Af Amer: >60 mL/min (ref >60)
Glucose, Bld: 227 mg/dL — ABNORMAL HIGH (ref 70–99)
POTASSIUM: 3.1 mmol/L — AB (ref 3.5–5.1)
Sodium: 133 mmol/L — ABNORMAL LOW (ref 135–145)

## 2018-09-04 LAB — CBC
HCT: 23.4 % — ABNORMAL LOW (ref 39.0–52.0)
HEMOGLOBIN: 7.3 g/dL — AB (ref 13.0–17.0)
MCH: 27.8 pg (ref 26.0–34.0)
MCHC: 31.2 g/dL (ref 30.0–36.0)
MCV: 89 fL (ref 80.0–100.0)
Platelets: 285 10*3/uL (ref 150–400)
RBC: 2.63 MIL/uL — ABNORMAL LOW (ref 4.22–5.81)
RDW: 12.7 % (ref 11.5–15.5)
WBC: 8.4 10*3/uL (ref 4.0–10.5)
nRBC: 0 % (ref 0.0–0.2)

## 2018-09-04 LAB — HEPATIC FUNCTION PANEL
ALT: 16 U/L (ref 0–44)
AST: 31 U/L (ref 15–41)
Albumin: 2.5 g/dL — ABNORMAL LOW (ref 3.5–5.0)
Alkaline Phosphatase: 48 U/L (ref 38–126)
Bilirubin, Direct: <0.1 mg/dL (ref 0.0–0.2)
Total Bilirubin: 0.5 mg/dL (ref 0.3–1.2)
Total Protein: 4.9 g/dL — ABNORMAL LOW (ref 6.5–8.1)

## 2018-09-04 LAB — HEPARIN LEVEL (UNFRACTIONATED)
Heparin Unfractionated: <0.1 IU/mL — ABNORMAL LOW (ref 0.30–0.70)
Heparin Unfractionated: 0.15 IU/mL — ABNORMAL LOW (ref 0.30–0.70)
Heparin Unfractionated: 0.48 IU/mL (ref 0.30–0.70)

## 2018-09-04 LAB — TROPONIN I
Troponin I: <0.03 ng/mL (ref <0.03)
Troponin I: <0.03 ng/mL (ref <0.03)

## 2018-09-04 LAB — MAGNESIUM
MAGNESIUM: 2.1 mg/dL (ref 1.7–2.4)
Magnesium: 2 mg/dL (ref 1.7–2.4)

## 2018-09-04 LAB — ECHOCARDIOGRAM COMPLETE
Height: 71 in
Weight: 2352.75 oz

## 2018-09-04 MED ORDER — POTASSIUM CHLORIDE CRYS ER 20 MEQ PO TBCR
40.0000 meq | EXTENDED_RELEASE_TABLET | Freq: Once | ORAL | Status: AC
  Administered 2018-09-04: 40 meq via ORAL
  Filled 2018-09-04: qty 2

## 2018-09-04 MED ORDER — HYDRALAZINE HCL 20 MG/ML IJ SOLN: 10.0000 mg | INTRAMUSCULAR | Status: DC | PRN

## 2018-09-04 NOTE — Progress Notes (Signed)
  Echocardiogram 2D Echocardiogram has been performed.  Nakul Avino T Makaiah Terwilliger 09/04/2018, 1:55 PM

## 2018-09-04 NOTE — Progress Notes (Signed)
ANTICOAGULATION CONSULT NOTE - [place] for Heparin  Indication: LLE DVT  Allergies  Allergen Reactions  . Lisinopril Anaphylaxis    Patient Measurements: Height: 5\' 11"  (180.3 cm) Weight: 147 lb 0.8 oz (66.7 kg) IBW/kg (Calculated) : 75.3 Heparin Dosing Weight: 66.7 kg   Vital Signs: Temp: 97.8 F (36.6 C) (12/14 1611) Temp Source: Oral (12/14 1611) BP: 99/76 (12/14 1611) Pulse Rate: 100 (12/14 1611)  Labs: Recent Labs    09/03/18 1325 09/03/18 1351  09/03/18 1951 09/03/18 2258 09/04/18 0724 09/04/18 1025 09/04/18 1033 09/04/18 1839  HGB 9.8* 10.2*   < > 8.2* 9.1* 7.3*  --   --   --   HCT 31.9* 30.0*   < > 24.0* 28.5* 23.4*  --   --   --   PLT 378  --   --   --  292 285  --   --   --   LABPROT 13.4  --   --   --   --   --   --   --   --   INR 1.03  --   --   --   --   --   --   --   --   HEPARINUNFRC  --   --   --   --   --  <0.10*  --  0.15* 0.48  CREATININE  --  1.10  --   --   --  1.12  --   --   --   TROPONINI  --   --   --   --  <0.03 <0.03 <0.03  --   --    < > = values in this interval not displayed.    Estimated Creatinine Clearance: 68.7 mL/min (by C-G formula based on SCr of 1.12 mg/dL).   Medical History: Past Medical History:  Diagnosis Date  . Back pain, chronic   . Diabetes ([place])   . Dilated cardiomyopathy ([place])    Echo 2/18: EF 45-50, diff HK, Gr 1 DD, trivial AI/TR  . HLD (hyperlipidemia)   . Hypertension   . IDDM (insulin dependent diabetes mellitus) ([place])   . Ruptured lumbar disc     Medications:  Infusions:  . sodium chloride    . sodium chloride 100 mL/hr at 09/04/18 0029  . heparin 1,250 Units/hr (09/04/18 1254)  . lactated ringers Stopped (09/03/18 1935)  . magnesium sulfate 1 - 4 g bolus IVPB      Assessment: 10 YOM who presented on 12/13 with RLE pain/numbness and found to have RLE DVT and limb ischemia s/p urgent arteriography and popliteal embolectomy. Heparin started intra-op at a rate of 400  units/hr. Pharmacy consulted to transition to full dose Heparin without a bolus starting at 0230 on 12/14.   Heparin level now therapeutic  Goal of Therapy:  Heparin level 0.3-0.7 units/ml Monitor platelets by anticoagulation protocol: Yes   Plan:  -Continue heparin at 1250 units / hr -Next level in AM  Thank you Anette Guarneri, PharmD [phone]  Please check AMION for all [place] numbers 09/04/2018

## 2018-09-04 NOTE — Progress Notes (Signed)
ANTICOAGULATION CONSULT NOTE - [place] for Heparin  Indication: LLE DVT  Allergies  Allergen Reactions  . Lisinopril Anaphylaxis    Patient Measurements: Height: 5\' 11"  (180.3 cm) Weight: 147 lb 0.8 oz (66.7 kg) IBW/kg (Calculated) : 75.3 Heparin Dosing Weight: 66.7 kg   Vital Signs: Temp: 97.8 F (36.6 C) (12/14 0433) Temp Source: Oral (12/14 0433) BP: 98/67 (12/14 0433) Pulse Rate: 85 (12/14 0433)  Labs: Recent Labs    09/03/18 1325 09/03/18 1351  09/03/18 1951 09/03/18 2258 09/04/18 0724 09/04/18 1033  HGB 9.8* 10.2*   < > 8.2* 9.1* 7.3*  --   HCT 31.9* 30.0*   < > 24.0* 28.5* 23.4*  --   PLT 378  --   --   --  292 285  --   LABPROT 13.4  --   --   --   --   --   --   INR 1.03  --   --   --   --   --   --   HEPARINUNFRC  --   --   --   --   --  <0.10* 0.15*  CREATININE  --  1.10  --   --   --  1.12  --   TROPONINI  --   --   --   --  <0.03 <0.03  --    < > = values in this interval not displayed.    Estimated Creatinine Clearance: 68.7 mL/min (by C-G formula based on SCr of 1.12 mg/dL).   Medical History: Past Medical History:  Diagnosis Date  . Back pain, chronic   . Diabetes ([place])   . Dilated cardiomyopathy ([place])    Echo 2/18: EF 45-50, diff HK, Gr 1 DD, trivial AI/TR  . HLD (hyperlipidemia)   . Hypertension   . IDDM (insulin dependent diabetes mellitus) ([place])   . Ruptured lumbar disc     Medications:  Infusions:  . sodium chloride    . sodium chloride 100 mL/hr at 09/04/18 0029  .  ceFAZolin (ANCEF) IV 2 g (09/04/18 0032)  . heparin 1,000 Units/hr (09/04/18 0452)  . lactated ringers Stopped (09/03/18 1935)  . magnesium sulfate 1 - 4 g bolus IVPB      Assessment: 28 YOM who presented on 12/13 with RLE pain/numbness and found to have RLE DVT and limb ischemia s/p urgent arteriography and popliteal embolectomy. Heparin started intra-op at a rate of 400 units/hr. Pharmacy consulted to transition to full dose Heparin  without a bolus starting at 0230 on 12/14.   Initial heparin level subtherapeutic at 0.15 on 1000 units/hr. Hgb noted to be down to 7.3, no S/Sx bleeding per RN or issues with infusion.  Goal of Therapy:  Heparin level 0.3-0.7 units/ml Monitor platelets by anticoagulation protocol: Yes   Plan:  -Increase heparin to 1250 units/hr -Recheck heparin level in 6 hours  Arrie Senate, PharmD, BCPS Clinical Pharmacist [phone] Please check AMION for all [place] numbers 09/04/2018

## 2018-09-04 NOTE — Progress Notes (Signed)
Attempted to ambulate the patient, he stand and did not put pressure on the right leg and complains about pain. We cancel the ambulation and will try at another time.

## 2018-09-04 NOTE — Progress Notes (Signed)
PROGRESS NOTE    Ryan Medina  YOV:[id number] DOB: [birth year] DOA: 09/03/2018 PCP: Patient, No Pcp Per   Brief Narrative:  57 year old with history of diabetes mellitus type 2, systolic CHF, cocaine use, essential hypertension was recently admitted here for DKA but after going home he developed left lower extremity pain.  Upon admission he was seen by vascular surgeon who performed emergent embolectomy on 12/13.  He was placed on heparin drip.   Assessment & Plan:   Principal Problem:   Popliteal artery embolus (HCC) Active Problems:   DM (diabetes mellitus), type 2, uncontrolled ([place])   Essential hypertension   Dilated cardiomyopathy (HCC)   Chest pain   AAA (abdominal aortic aneurysm) (HCC)   Ischemic foot   Right leg claudication (HCC)   PVD (peripheral vascular disease) ([place])  Left popliteal artery embolus status post embolectomy - Management per vascular surgical team.  Status post embolectomy on 12/13 -Continue heparin drip with transition to oral anticoagulation at surgery discretion - Drain management per surgery  Atypical chest pain secondary to cocaine use -This is resolved.  Had unremarkable cardiac cath 2 years ago - Last echocardiogram ejection fraction 40-45%  Hypokalemia -Replete PRN  Anemia -Slight hemoglobin drop.  Will closely monitor request.  Possible postsurgical.  Will do type and screen.  If below 7.0 will transfuse him  Essential hypertension - Holding amlodipine, IV hydralazine as needed  Chronic systolic congestive heart failure -Echocardiogram done about 1 week ago showed ejection fraction 40-45%.  Avoid use of beta-blocker due to cocaine use  DVT prophylaxis: Heparin drip Code Status: Full code Family Communication: None at bedside Disposition Plan: Maintain inpatient stay on IV heparin for now  Consultants:   Vascular surgery  Procedures:   Embolectomy on 12/13  Antimicrobials:   Just perioperative   Subjective: Feels  better, he is chest pain-free at this time.  Review of Systems Otherwise negative except as per HPI, including: General: Denies fever, chills, night sweats or unintended weight loss. Resp: Denies cough, wheezing, shortness of breath. Cardiac: Denies chest pain, palpitations, orthopnea, paroxysmal nocturnal dyspnea. GI: Denies abdominal pain, nausea, vomiting, diarrhea or constipation GU: Denies dysuria, frequency, hesitancy or incontinence MS: Denies muscle aches, joint pain or swelling Neuro: Denies headache, neurologic deficits (focal weakness, numbness, tingling), abnormal gait Psych: Denies anxiety, depression, SI/HI/AVH Skin: Denies new rashes or lesions ID: Denies sick contacts, exotic exposures, travel  Objective: Vitals:   09/03/18 2145 09/03/18 2207 09/04/18 0433 09/04/18 1140  BP:  104/75 98/67 92/68   Pulse: (!) 50 86 85 100  Resp: 18 18 15    Temp: 98.4 F (36.9 C) 97.9 F (36.6 C) 97.8 F (36.6 C) 97.8 F (36.6 C)  TempSrc:  Oral Oral Oral  SpO2: 100% 100% 98% 100%  Weight:      Height:  5\' 11"  (1.803 m)      Intake/Output Summary (Last 24 hours) at 09/04/2018 1328 Last data filed at 09/04/2018 1000 Gross per 24 hour  Intake 1700 ml  Output 1460 ml  Net 240 ml   Filed Weights   09/03/18 1650  Weight: 66.7 kg    Examination:  General exam: Appears calm and comfortable  Respiratory system: Clear to auscultation. Respiratory effort normal. Cardiovascular system: S1 & S2 heard, RRR. No JVD, murmurs, rubs, gallops or clicks. No pedal edema. Gastrointestinal system: Abdomen is nondistended, soft and nontender. No organomegaly or masses felt. Normal bowel sounds heard. Central nervous system: Alert and oriented. No focal neurological deficits. Extremities: Symmetric 5  x 5 power. Skin: No rashes, lesions or ulcers Psychiatry: Judgement and insight appear normal. Mood & affect appropriate.  JP drain is in place with moderate serous discharge   Data Reviewed:    CBC: Recent Labs  Lab 09/03/18 1325 09/03/18 1351 09/03/18 1801 09/03/18 1951 09/03/18 2258 09/04/18 0724  WBC 9.4  --   --   --  7.8 8.4  NEUTROABS 6.8  --   --   --   --   --   HGB 9.8* 10.2* 9.9* 8.2* 9.1* 7.3*  HCT 31.9* 30.0* 29.0* 24.0* 28.5* 23.4*  MCV 88.9  --   --   --  88.2 89.0  PLT 378  --   --   --  292 539   Basic Metabolic Panel: Recent Labs  Lab 09/03/18 1351 09/03/18 1801 09/03/18 1951 09/03/18 2258 09/04/18 0724  NA 129* 126* 127*  --  133*  K 2.4* 2.5* 2.9*  --  3.1*  CL 81*  --   --   --  84*  CO2  --   --   --   --  40*  GLUCOSE 216* 291* 281*  --  227*  BUN 23*  --   --   --  21*  CREATININE 1.10  --   --   --  1.12  CALCIUM  --   --   --   --  7.9*  MG  --   --   --  2.1 2.0   GFR: Estimated Creatinine Clearance: 68.7 mL/min (by C-G formula based on SCr of 1.12 mg/dL). Liver Function Tests: Recent Labs  Lab 09/04/18 0724  AST 31  ALT 16  ALKPHOS 48  BILITOT 0.5  PROT 4.9*  ALBUMIN 2.5*   No results for input(s): LIPASE, AMYLASE in the last 168 hours. No results for input(s): AMMONIA in the last 168 hours. Coagulation Profile: Recent Labs  Lab 09/03/18 1325  INR 1.03   Cardiac Enzymes: Recent Labs  Lab 09/03/18 2258 09/04/18 0724 09/04/18 1025  TROPONINI <0.03 <0.03 <0.03   BNP (last 3 results) No results for input(s): PROBNP in the last 8760 hours. HbA1C: No results for input(s): HGBA1C in the last 72 hours. CBG: Recent Labs  Lab 09/03/18 2054 09/03/18 2330 09/04/18 0602 09/04/18 1136 09/04/18 1301  GLUCAP 243* 91 305* 58* 86   Lipid Profile: No results for input(s): CHOL, HDL, LDLCALC, TRIG, CHOLHDL, LDLDIRECT in the last 72 hours. Thyroid Function Tests: No results for input(s): TSH, T4TOTAL, FREET4, T3FREE, THYROIDAB in the last 72 hours. Anemia Panel: No results for input(s): VITAMINB12, FOLATE, FERRITIN, TIBC, IRON, RETICCTPCT in the last 72 hours. Sepsis Labs: No results for input(s): PROCALCITON,  LATICACIDVEN in the last 168 hours.  No results found for this or any previous visit (from the past 240 hour(s)).       Radiology Studies: No results found.      Scheduled Meds: . atorvastatin  20 mg Oral Daily  . docusate sodium  100 mg Oral Daily  . gabapentin  300 mg Oral BID  . insulin aspart  0-9 Units Subcutaneous TID WC  . insulin detemir  20 Units Subcutaneous QHS  . insulin detemir  30 Units Subcutaneous Daily  . pantoprazole  40 mg Oral Daily  . potassium chloride  40 mEq Oral Once   Continuous Infusions: . sodium chloride    . sodium chloride 100 mL/hr at 09/04/18 0029  .  ceFAZolin (ANCEF) IV 2 g (09/04/18 0032)  .  heparin 1,250 Units/hr (09/04/18 1254)  . lactated ringers Stopped (09/03/18 1935)  . magnesium sulfate 1 - 4 g bolus IVPB       LOS: 1 day   Time spent= 30 mins    Ankit Arsenio Loader, MD [facility] Pager [phone]   If 7PM-7AM, please contact night-coverage [email or web address] Password [email or web address] 09/04/2018, 1:28 PM

## 2018-09-04 NOTE — Progress Notes (Addendum)
  Progress Note  VASCULAR SURGERY ASSESSMENT & PLAN:   ISCHEMIC RIGHT LOWER EXTREMITY: The patient is undergone successful right popliteal and tibial embolectomy.  He is currently on IV heparin.  2D echo has been ordered to rule out the source of embolization.  If his renal function is stable I would also recommend a CT angiogram of the chest to look for embolic source.  He does have a small abdominal aortic aneurysm and bilateral small common iliac artery aneurysms.  His arteries however in general are ectatic and large.  I have discussed with him this morning importance of tobacco cessation.  In addition I have discussed the importance of trying to get off cocaine completely as this can cause arrhythmias and potentially be a issue with embolic disease.  Currently he has minimal right calf tenderness and he has no significant swelling to suggest a compartment syndrome.  He has a palpable right posterior tibial pulse.  His JP has moderate serous drainage.  The output has not yet been recorded.  His JP should stay for now.  [name], MD, FACS Beeper [phone] Office: [phone]   09/04/2018 8:05 AM 1 Day Post-Op  Subjective:  Patient believes R foot feels better however he is having moderate to severe calf pain overnight   Vitals:   09/03/18 2207 09/04/18 0433  BP: 104/75 98/67  Pulse: 86 85  Resp: 18 15  Temp: 97.9 F (36.6 C) 97.8 F (36.6 C)  SpO2: 100% 98%   Physical Exam: Lungs:  Non labored Incisions:  R popliteal incision c/d/i Extremities:  Pain with light manipulation of R calf; palpable R PTA Neurologic: A&O  CBC    Component Value Date/Time   WBC 7.8 09/03/2018 2258   RBC 3.23 (L) 09/03/2018 2258   HGB 9.1 (L) 09/03/2018 2258   HCT 28.5 (L) 09/03/2018 2258   PLT 292 09/03/2018 2258   MCV 88.2 09/03/2018 2258   MCH 28.2 09/03/2018 2258   MCHC 31.9 09/03/2018 2258   RDW 12.6 09/03/2018 2258   LYMPHSABS 1.9 09/03/2018 1325   MONOABS 0.7 09/03/2018  1325   EOSABS 0.0 09/03/2018 1325   BASOSABS 0.0 09/03/2018 1325    BMET    Component Value Date/Time   NA 127 (L) 09/03/2018 1951   K 2.9 (L) 09/03/2018 1951   CL 81 (L) 09/03/2018 1351   CO2 32 08/24/2018 0250   GLUCOSE 281 (H) 09/03/2018 1951   BUN 23 (H) 09/03/2018 1351   CREATININE 1.10 09/03/2018 1351   CREATININE 0.92 05/15/2016 1038   CALCIUM 8.4 (L) 08/24/2018 0250   GFRNONAA >60 08/24/2018 0250   GFRAA >60 08/24/2018 0250    INR    Component Value Date/Time   INR 1.03 09/03/2018 1325     Intake/Output Summary (Last 24 hours) at 09/04/2018 0805 Last data filed at 09/04/2018 0500 Gross per 24 hour  Intake 1700 ml  Output 1250 ml  Net 450 ml     Assessment/Plan:  57 y.o. male is s/p RLE arteriogram with subsequent popliteal and tibial embolectomy 1 Day Post-Op   Perfusing RLE well with palpable R PTA IV heparin dosed by pharmacy Echo today as part of embolic workup; considering CTA chest pending renal labs Medical management per primary service Dr. Scot Dock will evaluate R calf pain and provide further treatment plan   Dagoberto Ligas, PA-C [facility] [phone] 09/04/2018 8:05 AM

## 2018-09-05 ENCOUNTER — Inpatient Hospital Stay (HOSPITAL_COMMUNITY): Payer: Medicaid Other | Admitting: Anesthesiology

## 2018-09-05 ENCOUNTER — Encounter (HOSPITAL_COMMUNITY)
Admission: EM | Disposition: A | Discharge status: Skilled Nursing Facility | Payer: Self-pay | Source: Home / Self Care | Attending: Internal Medicine

## 2018-09-05 ENCOUNTER — Encounter (HOSPITAL_COMMUNITY): Payer: Self-pay | Admitting: Certified Registered Nurse Anesthetist

## 2018-09-05 ENCOUNTER — Other Ambulatory Visit: Payer: Self-pay

## 2018-09-05 DIAGNOSIS — I97638 Postprocedural hematoma of a circulatory system organ or structure following other circulatory system procedure: Secondary | ICD-10-CM

## 2018-09-05 HISTORY — PX: FASCIOTOMY: SHX132

## 2018-09-05 HISTORY — PX: APPLICATION OF WOUND VAC: SHX5189

## 2018-09-05 HISTORY — PX: HEMATOMA EVACUATION: SHX5118

## 2018-09-05 LAB — BASIC METABOLIC PANEL
Anion gap: 15 (ref 5–15)
BUN: 16 mg/dL (ref 6–20)
CO2: 30 mmol/L (ref 22–32)
Calcium: 7.3 mg/dL — ABNORMAL LOW (ref 8.9–10.3)
Chloride: 86 mmol/L — ABNORMAL LOW (ref 98–111)
Creatinine, Ser: 0.78 mg/dL (ref 0.61–1.24)
GFR calc Af Amer: >60 mL/min (ref >60)
GFR calc non Af Amer: >60 mL/min (ref >60)
GLUCOSE: 196 mg/dL — AB (ref 70–99)
POTASSIUM: 3.4 mmol/L — AB (ref 3.5–5.1)
Sodium: 131 mmol/L — ABNORMAL LOW (ref 135–145)

## 2018-09-05 LAB — CBC
HCT: 23.1 % — ABNORMAL LOW (ref 39.0–52.0)
Hemoglobin: 7.2 g/dL — ABNORMAL LOW (ref 13.0–17.0)
MCH: 27.8 pg (ref 26.0–34.0)
MCHC: 31.2 g/dL (ref 30.0–36.0)
MCV: 89.2 fL (ref 80.0–100.0)
Platelets: 268 10*3/uL (ref 150–400)
RBC: 2.59 MIL/uL — ABNORMAL LOW (ref 4.22–5.81)
RDW: 12.5 % (ref 11.5–15.5)
WBC: 9 10*3/uL (ref 4.0–10.5)
nRBC: 0 % (ref 0.0–0.2)

## 2018-09-05 LAB — GLUCOSE, CAPILLARY
GLUCOSE-CAPILLARY: 186 mg/dL — AB (ref 70–99)
Glucose-Capillary: 119 mg/dL — ABNORMAL HIGH (ref 70–99)
Glucose-Capillary: 94 mg/dL (ref 70–99)

## 2018-09-05 LAB — HEPARIN LEVEL (UNFRACTIONATED): Heparin Unfractionated: 0.39 IU/mL (ref 0.30–0.70)

## 2018-09-05 LAB — MAGNESIUM: Magnesium: 2 mg/dL (ref 1.7–2.4)

## 2018-09-05 LAB — PREPARE RBC (CROSSMATCH)

## 2018-09-05 SURGERY — EVACUATION HEMATOMA
Anesthesia: General | Laterality: Right

## 2018-09-05 MED ORDER — SODIUM CHLORIDE 0.9 % IV SOLN
INTRAVENOUS | Status: DC | PRN
  Administered 2018-09-05: 500 mL

## 2018-09-05 MED ORDER — PHENYLEPHRINE 40 MCG/ML (10ML) SYRINGE FOR IV PUSH (FOR BLOOD PRESSURE SUPPORT)
PREFILLED_SYRINGE | INTRAVENOUS | Status: AC
  Filled 2018-09-05: qty 10

## 2018-09-05 MED ORDER — SODIUM CHLORIDE 0.9 % IV SOLN
INTRAVENOUS | Status: DC | PRN
  Administered 2018-09-05: 25 ug/min via INTRAVENOUS

## 2018-09-05 MED ORDER — HEPARIN (PORCINE) 25000 UT/250ML-% IV SOLN
1300.0000 [IU]/h | INTRAVENOUS | Status: DC
  Administered 2018-09-05: 1250 [IU]/h via INTRAVENOUS
  Filled 2018-09-05: qty 250

## 2018-09-05 MED ORDER — MIDAZOLAM HCL 2 MG/2ML IJ SOLN
INTRAMUSCULAR | Status: AC
  Filled 2018-09-05: qty 2

## 2018-09-05 MED ORDER — FENTANYL CITRATE (PF) 100 MCG/2ML IJ SOLN: 25.0000 ug | INTRAMUSCULAR | Status: DC | PRN

## 2018-09-05 MED ORDER — SODIUM CHLORIDE 0.9% IV SOLUTION: Freq: Once | INTRAVENOUS | Status: DC

## 2018-09-05 MED ORDER — DIPHENHYDRAMINE HCL 50 MG/ML IJ SOLN
INTRAMUSCULAR | Status: DC | PRN
  Administered 2018-09-05: 12.5 mg via INTRAVENOUS

## 2018-09-05 MED ORDER — PHENYLEPHRINE 40 MCG/ML (10ML) SYRINGE FOR IV PUSH (FOR BLOOD PRESSURE SUPPORT)
PREFILLED_SYRINGE | INTRAVENOUS | Status: DC | PRN
  Administered 2018-09-05: 80 ug via INTRAVENOUS
  Administered 2018-09-05 (×2): 120 ug via INTRAVENOUS
  Administered 2018-09-05 (×2): 80 ug via INTRAVENOUS

## 2018-09-05 MED ORDER — LIDOCAINE 2% (20 MG/ML) 5 ML SYRINGE
INTRAMUSCULAR | Status: DC | PRN
  Administered 2018-09-05: 100 mg via INTRAVENOUS

## 2018-09-05 MED ORDER — PROMETHAZINE HCL 25 MG/ML IJ SOLN: 6.2500 mg | INTRAMUSCULAR | Status: DC | PRN

## 2018-09-05 MED ORDER — FENTANYL CITRATE (PF) 250 MCG/5ML IJ SOLN
INTRAMUSCULAR | Status: AC
  Filled 2018-09-05: qty 5

## 2018-09-05 MED ORDER — ONDANSETRON HCL 4 MG/2ML IJ SOLN
INTRAMUSCULAR | Status: DC | PRN
  Administered 2018-09-05: 4 mg via INTRAVENOUS

## 2018-09-05 MED ORDER — DIPHENHYDRAMINE HCL 50 MG/ML IJ SOLN
INTRAMUSCULAR | Status: AC
  Filled 2018-09-05: qty 1

## 2018-09-05 MED ORDER — CEFAZOLIN SODIUM-DEXTROSE 1-4 GM/50ML-% IV SOLN
1.0000 g | INTRAVENOUS | Status: AC
  Administered 2018-09-05: 1 g via INTRAVENOUS
  Filled 2018-09-05: qty 50

## 2018-09-05 MED ORDER — ONDANSETRON HCL 4 MG/2ML IJ SOLN
INTRAMUSCULAR | Status: AC
  Filled 2018-09-05: qty 2

## 2018-09-05 MED ORDER — FENTANYL CITRATE (PF) 100 MCG/2ML IJ SOLN
INTRAMUSCULAR | Status: DC | PRN
  Administered 2018-09-05: 50 ug via INTRAVENOUS

## 2018-09-05 MED ORDER — LIDOCAINE 2% (20 MG/ML) 5 ML SYRINGE
INTRAMUSCULAR | Status: AC
  Filled 2018-09-05: qty 5

## 2018-09-05 MED ORDER — LACTATED RINGERS IV SOLN
INTRAVENOUS | Status: DC | PRN
  Administered 2018-09-05 (×2): via INTRAVENOUS

## 2018-09-05 MED ORDER — SUCCINYLCHOLINE CHLORIDE 200 MG/10ML IV SOSY
PREFILLED_SYRINGE | INTRAVENOUS | Status: DC | PRN
  Administered 2018-09-05: 100 mg via INTRAVENOUS

## 2018-09-05 MED ORDER — 0.9 % SODIUM CHLORIDE (POUR BTL) OPTIME
TOPICAL | Status: DC | PRN
  Administered 2018-09-05: 2000 mL

## 2018-09-05 MED ORDER — SODIUM CHLORIDE 0.9 % IV SOLN
INTRAVENOUS | Status: AC
  Filled 2018-09-05: qty 1.2

## 2018-09-05 MED ORDER — ALBUMIN HUMAN 5 % IV SOLN
INTRAVENOUS | Status: DC | PRN
  Administered 2018-09-05: 10:00:00 via INTRAVENOUS

## 2018-09-05 MED ORDER — PROPOFOL 10 MG/ML IV BOLUS
INTRAVENOUS | Status: DC | PRN
  Administered 2018-09-05: 140 mg via INTRAVENOUS

## 2018-09-05 MED ORDER — PROPOFOL 10 MG/ML IV BOLUS
INTRAVENOUS | Status: AC
  Filled 2018-09-05: qty 20

## 2018-09-05 MED ORDER — SUCCINYLCHOLINE CHLORIDE 200 MG/10ML IV SOSY
PREFILLED_SYRINGE | INTRAVENOUS | Status: AC
  Filled 2018-09-05: qty 10

## 2018-09-05 SURGICAL SUPPLY — 54 items
BANDAGE ACE 4X5 VEL STRL LF (GAUZE/BANDAGES/DRESSINGS) IMPLANT
BANDAGE ACE 6X5 VEL STRL LF (GAUZE/BANDAGES/DRESSINGS) IMPLANT
BANDAGE ESMARK 6X9 LF (GAUZE/BANDAGES/DRESSINGS) IMPLANT
BNDG ESMARK 6X9 LF (GAUZE/BANDAGES/DRESSINGS)
BNDG GAUZE ELAST 4 BULKY (GAUZE/BANDAGES/DRESSINGS) IMPLANT
CANISTER SUCT 3000ML PPV (MISCELLANEOUS) ×3 IMPLANT
CONNECTOR Y ATS VAC SYSTEM (MISCELLANEOUS) ×3 IMPLANT
COVER WAND RF STERILE (DRAPES) ×3 IMPLANT
CUFF TOURNIQUET SINGLE 18IN (TOURNIQUET CUFF) IMPLANT
CUFF TOURNIQUET SINGLE 24IN (TOURNIQUET CUFF) IMPLANT
CUFF TOURNIQUET SINGLE 34IN LL (TOURNIQUET CUFF) IMPLANT
CUFF TOURNIQUET SINGLE 44IN (TOURNIQUET CUFF) IMPLANT
DRAIN CHANNEL 15F RND FF W/TCR (WOUND CARE) IMPLANT
DRAPE HALF SHEET 40X57 (DRAPES) IMPLANT
DRAPE INCISE IOBAN 66X45 STRL (DRAPES) IMPLANT
DRAPE ORTHO SPLIT 77X108 STRL (DRAPES)
DRAPE SURG ORHT 6 SPLT 77X108 (DRAPES) IMPLANT
DRSG COVADERM 4X6 (GAUZE/BANDAGES/DRESSINGS) ×3 IMPLANT
DRSG MEPITEL 3X4 ME34 (GAUZE/BANDAGES/DRESSINGS) ×3 IMPLANT
DRSG VAC ATS LRG SENSATRAC (GAUZE/BANDAGES/DRESSINGS) ×6 IMPLANT
ELECT REM PT RETURN 9FT ADLT (ELECTROSURGICAL) ×3
ELECTRODE REM PT RTRN 9FT ADLT (ELECTROSURGICAL) ×1 IMPLANT
EVACUATOR SILICONE 100CC (DRAIN) IMPLANT
GAUZE SPONGE 4X4 12PLY STRL (GAUZE/BANDAGES/DRESSINGS) ×3 IMPLANT
GLOVE BIO SURGEON STRL SZ7.5 (GLOVE) ×6 IMPLANT
GLOVE BIOGEL PI IND STRL 7.0 (GLOVE) ×1 IMPLANT
GLOVE BIOGEL PI IND STRL 8 (GLOVE) ×2 IMPLANT
GLOVE BIOGEL PI INDICATOR 7.0 (GLOVE) ×2
GLOVE BIOGEL PI INDICATOR 8 (GLOVE) ×4
GOWN STRL REUS W/ TWL LRG LVL3 (GOWN DISPOSABLE) ×2 IMPLANT
GOWN STRL REUS W/TWL LRG LVL3 (GOWN DISPOSABLE) ×4
KIT BASIN OR (CUSTOM PROCEDURE TRAY) ×3 IMPLANT
KIT TURNOVER KIT B (KITS) ×3 IMPLANT
NS IRRIG 1000ML POUR BTL (IV SOLUTION) ×6 IMPLANT
PACK CV ACCESS (CUSTOM PROCEDURE TRAY) IMPLANT
PACK GENERAL/GYN (CUSTOM PROCEDURE TRAY) IMPLANT
PACK PERIPHERAL VASCULAR (CUSTOM PROCEDURE TRAY) ×3 IMPLANT
PACK UNIVERSAL I (CUSTOM PROCEDURE TRAY) IMPLANT
PAD ARMBOARD 7.5X6 YLW CONV (MISCELLANEOUS) ×6 IMPLANT
PAD NEG PRESSURE SENSATRAC (MISCELLANEOUS) ×3 IMPLANT
STAPLER SKIN 35 WIDE (STAPLE) ×3 IMPLANT
STAPLER VISISTAT (STAPLE) IMPLANT
SUT ETHILON 3 0 PS 1 (SUTURE) IMPLANT
SUT PROLENE 5 0 C 1 24 (SUTURE) IMPLANT
SUT PROLENE 6 0 BV (SUTURE) ×6 IMPLANT
SUT VIC AB 2-0 CTB1 (SUTURE) IMPLANT
SUT VIC AB 3-0 SH 27 (SUTURE) ×2
SUT VIC AB 3-0 SH 27X BRD (SUTURE) ×1 IMPLANT
SUT VICRYL 4-0 PS2 18IN ABS (SUTURE) IMPLANT
TOWEL GREEN STERILE (TOWEL DISPOSABLE) ×3 IMPLANT
TRAY FOLEY MTR SLVR 16FR STAT (SET/KITS/TRAYS/PACK) IMPLANT
UNDERPAD 30X30 (UNDERPADS AND DIAPERS) ×3 IMPLANT
WATER STERILE IRR 1000ML POUR (IV SOLUTION) ×3 IMPLANT
WND VAC CANISTER 500ML (MISCELLANEOUS) ×3 IMPLANT

## 2018-09-05 NOTE — Progress Notes (Addendum)
ANTICOAGULATION CONSULT NOTE - [place] for Heparin  Indication: LLE DVT  Allergies  Allergen Reactions  . Lisinopril Anaphylaxis    Patient Measurements: Height: 5\' 11"  (180.3 cm) Weight: 147 lb 0.8 oz (66.7 kg) IBW/kg (Calculated) : 75.3 Heparin Dosing Weight: 66.7 kg   Vital Signs: Temp: 97.9 F (36.6 C) (12/15 0818) Temp Source: Oral (12/15 0818) BP: 104/79 (12/15 0818) Pulse Rate: 97 (12/15 0818)  Labs: Recent Labs    09/03/18 1325 09/03/18 1351  09/03/18 2258  09/04/18 0724 09/04/18 1025 09/04/18 1033 09/04/18 1839 09/05/18 0231  HGB 9.8* 10.2*   < > 9.1*  --  7.3*  --   --   --  7.2*  HCT 31.9* 30.0*   < > 28.5*  --  23.4*  --   --   --  23.1*  PLT 378  --   --  292  --  285  --   --   --  268  LABPROT 13.4  --   --   --   --   --   --   --   --   --   INR 1.03  --   --   --   --   --   --   --   --   --   HEPARINUNFRC  --   --   --   --    < > <0.10*  --  0.15* 0.48 0.39  CREATININE  --  1.10  --   --   --  1.12  --   --   --  0.78  TROPONINI  --   --   --  <0.03  --  <0.03 <0.03  --   --   --    < > = values in this interval not displayed.    Estimated Creatinine Clearance: 96.1 mL/min (by C-G formula based on SCr of 0.78 mg/dL).   Medical History: Past Medical History:  Diagnosis Date  . Back pain, chronic   . Diabetes ([place])   . Dilated cardiomyopathy ([place])    Echo 2/18: EF 45-50, diff HK, Gr 1 DD, trivial AI/TR  . HLD (hyperlipidemia)   . Hypertension   . IDDM (insulin dependent diabetes mellitus) ([place])   . Ruptured lumbar disc     Medications:  Infusions:  . sodium chloride    . sodium chloride 100 mL/hr at 09/05/18 0039  . lactated ringers Stopped (09/03/18 1935)  . magnesium sulfate 1 - 4 g bolus IVPB      Assessment: 4 YOM who presented on 12/13 with RLE pain/numbness and found to have RLE DVT and limb ischemia s/p urgent arteriography and popliteal embolectomy. Heparin started intra-op at a rate of 400  units/hr. Pharmacy consulted to transition to full dose Heparin without a bolus starting at 0230 on 12/14.   Heparin level therapeutic this morning, Hgb low but stable from yesterday. Pt noted to have hematoma this morning, VVS to take to OR for evacuation. Heparin stopped this morning.  Goal of Therapy:  Heparin level 0.3-0.7 units/ml Monitor platelets by anticoagulation protocol: Yes   Plan:  -Hold anticoagulation for now -Pharmacy will sign off, reconsult if further anticoagulation needed  ADDENDUM: Pt s/p hematoma evacuation within OR. Pharmacy to resume in 8hr with no bolus.  Plan: -Resume heparin 1250 units/hr with no bolus at 1900 -Check heparin level with morning labs -Will adjust heparin target to 0.3-0.5 with hematoma today  Arrie Senate, PharmD, BCPS Clinical Pharmacist [phone] Please check AMION for all [place] numbers 09/05/2018

## 2018-09-05 NOTE — Progress Notes (Signed)
   VASCULAR SURGERY ASSESSMENT & PLAN:   2 Days Post-Op s/p: Right popliteal and tibial embolectomy with bovine pericardial patch angioplasty.  The patient states that early this morning he began developing increasing right leg pain.  On exam the right leg is more swollen.  Yesterday he had clear lymphatic drainage from his JP but now is having significant bloody drainage from his JP.  I have stopped his heparin and will take him urgently to the operating room for exploration to rule out bleeding from his surgical site.  In addition I have discussed the possibility that we would perform fasciotomies.  We will proceed urgently.  SUBJECTIVE:   Complaining of severe right leg pain which began early this morning.  PHYSICAL EXAM:   Vitals:   09/05/18 0033 09/05/18 0500 09/05/18 0537 09/05/18 0818  BP: 117/85  100/84 104/79  Pulse: (!) 104 (!) 106 93 97  Resp: 14 20 15 16   Temp: 98.8 F (37.1 C)  99.2 F (37.3 C) 97.9 F (36.6 C)  TempSrc: Oral  Oral Oral  SpO2: 100% 100% 99% 100%  Weight:      Height:       The right leg is clearly more swollen this morning with increased bloody drainage from his JP. 140 cc of bloody drainage is recorded from his JP for the last shift however his JP filled up quickly after I emptied it. He has a brisk posterior tibial signal with the Doppler.  LABS:   Lab Results  Component Value Date   WBC 9.0 09/05/2018   HGB 7.2 (L) 09/05/2018   HCT 23.1 (L) 09/05/2018   MCV 89.2 09/05/2018   PLT 268 09/05/2018   Lab Results  Component Value Date   CREATININE 0.78 09/05/2018   Lab Results  Component Value Date   INR 1.03 09/03/2018   CBG (last 3)  Recent Labs    09/04/18 2241 09/04/18 2342 09/05/18 0631  GLUCAP 74 93 119*    PROBLEM LIST:    Principal Problem:   Popliteal artery embolus (HCC) Active Problems:   DM (diabetes mellitus), type 2, uncontrolled (HCC)   Essential hypertension   Dilated cardiomyopathy (HCC)   Chest pain   AAA  (abdominal aortic aneurysm) (HCC)   Ischemic foot   Right leg claudication (HCC)   PVD (peripheral vascular disease) ([place])   CURRENT MEDS:   . atorvastatin  20 mg Oral Daily  . docusate sodium  100 mg Oral Daily  . gabapentin  300 mg Oral BID  . insulin aspart  0-9 Units Subcutaneous TID WC  . insulin detemir  20 Units Subcutaneous QHS  . insulin detemir  30 Units Subcutaneous Daily  . pantoprazole  40 mg Oral Daily    [name] Beeper: [phone] Office: [phone] 09/05/2018

## 2018-09-05 NOTE — Anesthesia Postprocedure Evaluation (Signed)
Anesthesia Post Note  Patient: Nicola Police Colberg  Procedure(s) Performed: EVACUATION HEMATOMA OF RIGHT LOWER EXTREMITY; EVACUATION OF LYMPHOCELE RIGHT LOWER LEG  (Right ) FOUR COMPARTMENT FASCIOTOMY RIGHT LOWER LEG  (Right ) APPLICATION OF WOUND VAC RIGHT LOWER MEDIAL AND LATERAL FASCIOTOMY SITE (Right )     Patient location during evaluation: PACU Anesthesia Type: General Level of consciousness: awake and alert, awake and oriented Pain management: pain level controlled Vital Signs Assessment: post-procedure vital signs reviewed and stable Respiratory status: spontaneous breathing, nonlabored ventilation and respiratory function stable Cardiovascular status: blood pressure returned to baseline and stable Postop Assessment: no apparent nausea or vomiting Anesthetic complications: no    Last Vitals:  Vitals:   09/05/18 1429 09/05/18 1628  BP: 116/85 111/84  Pulse: 97 100  Resp: 15 17  Temp: 36.6 C 36.6 C  SpO2: 100% 100%    Last Pain:  Vitals:   09/05/18 1628  TempSrc: Oral  PainSc:                  Catalina Gravel

## 2018-09-05 NOTE — Transfer of Care (Signed)
Immediate Anesthesia Transfer of Care Note  Patient: Ryan Medina  Procedure(s) Performed: EVACUATION HEMATOMA OF RIGHT LOWER EXTREMITY; EVACUATION OF LYMPHOCELE RIGHT LOWER LEG  (Right ) FOUR COMPARTMENT FASCIOTOMY RIGHT LOWER LEG  (Right ) APPLICATION OF WOUND VAC RIGHT LOWER MEDIAL AND LATERAL FASCIOTOMY SITE (Right )  Patient Location: PACU  Anesthesia Type:General  Level of Consciousness: awake, alert  and oriented  Airway & Oxygen Therapy: Patient Spontanous Breathing and Patient connected to face mask oxygen  Post-op Assessment: Report given to RN and Post -op Vital signs reviewed and stable  Post vital signs: Reviewed and stable  Last Vitals:  Vitals Value Taken Time  BP 132/85 09/05/2018 11:14 AM  Temp    Pulse 88 09/05/2018 11:17 AM  Resp 12 09/05/2018 11:17 AM  SpO2 63 % 09/05/2018 11:17 AM  Vitals shown include unvalidated device data.  Last Pain:  Vitals:   09/05/18 0818  TempSrc: Oral  PainSc:          Complications: No apparent anesthesia complications   2 units PRBCs arrived to OR upon extubation. Handed off to PACU RN to start transfusion.

## 2018-09-05 NOTE — Progress Notes (Signed)
PROGRESS NOTE    Ryan Medina  DUK:[id number] DOB: [birth year] DOA: 09/03/2018 PCP: Patient, No Pcp Per   Brief Narrative:  57 year old with history of diabetes mellitus type 2, systolic CHF, cocaine use, essential hypertension was recently admitted here for DKA but after going home he developed left lower extremity pain.  Upon admission he was seen by vascular surgeon who performed emergent embolectomy on 12/13.  He was placed on heparin drip.  Following day patient was noted to have bleeding from his right leg therefore he was taken to the OR for evacuation of the right sided hematoma and evaluation for compartment syndrome requiring fasciotomy.   Assessment & Plan:   Principal Problem:   Popliteal artery embolus (HCC) Active Problems:   DM (diabetes mellitus), type 2, uncontrolled ([place])   Essential hypertension   Dilated cardiomyopathy (HCC)   Chest pain   AAA (abdominal aortic aneurysm) (HCC)   Ischemic foot   Right leg claudication (HCC)   PVD (peripheral vascular disease) ([place])  Left popliteal artery embolus status post embolectomy Right lower extremity hematoma, lymphocele requiring compartment fasciotomy - Management per vascular surgical team.  Status post embolectomy on 12/13 -Heparin drip has been stopped.  Urgent OR for evacuation of hematoma and fasciotomy. - Management per vascular surgery team.  Atypical chest pain secondary to cocaine use; resolved  -This is resolved.  Had unremarkable cardiac cath 2 years ago - Last echocardiogram ejection fraction 40-45%  Hypokalemia -Replete PRN  Anemia -reamains stable around 7.2. suspect he might need transfusion tomorrow.   Essential hypertension - Holding amlodipine, IV hydralazine as needed  Chronic systolic congestive heart failure -Echocardiogram done about 1 week ago showed ejection fraction 40-45%.  Avoid use of beta-blocker due to cocaine use  DVT prophylaxis: Heparin drip on hold due to bleeding.  Code  Status: Full code Family Communication: None at bedside Disposition Plan: maintain in patient stay until his LE pain and swelling has improved. Today taken urgently to the OR for evacuation of hematoma.   Consultants:   Vascular surgery  Procedures:   Embolectomy on 12/13  Evacuation of hematoma and fasciotomy on 09/05/18  Antimicrobials:   PeriOp   Subjective: Bleeding noted in his JP drain, taken to the OR.    Objective: Vitals:   09/05/18 1315 09/05/18 1318 09/05/18 1333 09/05/18 1348  BP: 122/85 115/85 116/85 110/83  Pulse: 95 98 99 96  Resp: 12 10 13 13   Temp:      TempSrc:      SpO2: 100% 100% 100% 100%  Weight:      Height:        Intake/Output Summary (Last 24 hours) at 09/05/2018 1349 Last data filed at 09/05/2018 1256 Gross per 24 hour  Intake 2955 ml  Output 1565 ml  Net 1390 ml   Filed Weights   09/03/18 1650  Weight: 66.7 kg    Examination:  Constitutional: NAD, calm, comfortable; drwosy post Op Eyes: PERRL, lids and conjunctivae normal ENMT: Mucous membranes are moist. Posterior pharynx clear of any exudate or lesions.Normal dentition.  Neck: normal, supple, no masses, no thyromegaly Respiratory: clear to auscultation bilaterally, no wheezing, no crackles. Normal respiratory effort. No accessory muscle use.  Cardiovascular: Regular rate and rhythm, no murmurs / rubs / gallops. No extremity edema. 2+ pedal pulses. No carotid bruits.  Abdomen: no tenderness, no masses palpated. No hepatosplenomegaly. Bowel sounds positive.  Musculoskeletal: no clubbing / cyanosis. No joint deformity upper and lower extremities. Good ROM, no contractures. Normal  muscle tone.  Skin: LE dressing in place.  Neurologic: CN 2-12 grossly intact.  Psychiatric: unable to fully assess   Data Reviewed:   CBC: Recent Labs  Lab 09/03/18 1325  09/03/18 1801 09/03/18 1951 09/03/18 2258 09/04/18 0724 09/05/18 0231  WBC 9.4  --   --   --  7.8 8.4 9.0  NEUTROABS  6.8  --   --   --   --   --   --   HGB 9.8*   < > 9.9* 8.2* 9.1* 7.3* 7.2*  HCT 31.9*   < > 29.0* 24.0* 28.5* 23.4* 23.1*  MCV 88.9  --   --   --  88.2 89.0 89.2  PLT 378  --   --   --  292 285 268   < > = values in this interval not displayed.   Basic Metabolic Panel: Recent Labs  Lab 09/03/18 1351 09/03/18 1801 09/03/18 1951 09/03/18 2258 09/04/18 0724 09/05/18 0231  NA 129* 126* 127*  --  133* 131*  K 2.4* 2.5* 2.9*  --  3.1* 3.4*  CL 81*  --   --   --  84* 86*  CO2  --   --   --   --  40* 30  GLUCOSE 216* 291* 281*  --  227* 196*  BUN 23*  --   --   --  21* 16  CREATININE 1.10  --   --   --  1.12 0.78  CALCIUM  --   --   --   --  7.9* 7.3*  MG  --   --   --  2.1 2.0 2.0   GFR: Estimated Creatinine Clearance: 96.1 mL/min (by C-G formula based on SCr of 0.78 mg/dL). Liver Function Tests: Recent Labs  Lab 09/04/18 0724  AST 31  ALT 16  ALKPHOS 48  BILITOT 0.5  PROT 4.9*  ALBUMIN 2.5*   No results for input(s): LIPASE, AMYLASE in the last 168 hours. No results for input(s): AMMONIA in the last 168 hours. Coagulation Profile: Recent Labs  Lab 09/03/18 1325  INR 1.03   Cardiac Enzymes: Recent Labs  Lab 09/03/18 2258 09/04/18 0724 09/04/18 1025  TROPONINI <0.03 <0.03 <0.03   BNP (last 3 results) No results for input(s): PROBNP in the last 8760 hours. HbA1C: No results for input(s): HGBA1C in the last 72 hours. CBG: Recent Labs  Lab 09/04/18 2241 09/04/18 2342 09/05/18 0631 09/05/18 1116 09/05/18 1301  GLUCAP 74 93 119* 94 186*   Lipid Profile: No results for input(s): CHOL, HDL, LDLCALC, TRIG, CHOLHDL, LDLDIRECT in the last 72 hours. Thyroid Function Tests: No results for input(s): TSH, T4TOTAL, FREET4, T3FREE, THYROIDAB in the last 72 hours. Anemia Panel: No results for input(s): VITAMINB12, FOLATE, FERRITIN, TIBC, IRON, RETICCTPCT in the last 72 hours. Sepsis Labs: No results for input(s): PROCALCITON, LATICACIDVEN in the last 168  hours.  No results found for this or any previous visit (from the past 240 hour(s)).       Radiology Studies: No results found.      Scheduled Meds: . sodium chloride   Intravenous Once  . [MAR Hold] atorvastatin  20 mg Oral Daily  . [MAR Hold] docusate sodium  100 mg Oral Daily  . [MAR Hold] gabapentin  300 mg Oral BID  . [MAR Hold] insulin aspart  0-9 Units Subcutaneous TID WC  . [MAR Hold] insulin detemir  20 Units Subcutaneous QHS  . [MAR Hold] insulin detemir  30  Units Subcutaneous Daily  . [MAR Hold] pantoprazole  40 mg Oral Daily   Continuous Infusions: . [MAR Hold] sodium chloride    . sodium chloride 100 mL/hr at 09/05/18 0039  . heparin    . lactated ringers Stopped (09/03/18 1935)  . [MAR Hold] magnesium sulfate 1 - 4 g bolus IVPB       LOS: 2 days   Time spent= 30 mins    Burlie Cajamarca Arsenio Loader, MD [facility] Pager [phone]   If 7PM-7AM, please contact night-coverage [email or web address] Password [email or web address] 09/05/2018, 1:49 PM

## 2018-09-05 NOTE — Anesthesia Procedure Notes (Signed)
Procedure Name: Intubation Date/Time: 09/05/2018 9:50 AM Performed by: Candis Shine, CRNA Pre-anesthesia Checklist: Patient identified, Emergency Drugs available, Suction available and Patient being monitored Patient Re-evaluated:Patient Re-evaluated prior to induction Oxygen Delivery Method: Circle System Utilized Preoxygenation: Pre-oxygenation with 100% oxygen Induction Type: IV induction, Rapid sequence and Cricoid Pressure applied Laryngoscope Size: Mac and 4 Grade View: Grade I Tube type: Oral Tube size: 7.5 mm Number of attempts: 1 Airway Equipment and Method: Stylet Placement Confirmation: ETT inserted through vocal cords under direct vision,  positive ETCO2 and breath sounds checked- equal and bilateral Secured at: 24 cm Tube secured with: Tape Dental Injury: Teeth and Oropharynx as per pre-operative assessment

## 2018-09-05 NOTE — Anesthesia Preprocedure Evaluation (Addendum)
Anesthesia Evaluation  Patient identified by MRN, date of birth, ID band Patient awake    Reviewed: Allergy & Precautions, NPO status , Patient's Chart, lab work & pertinent test results  Airway Mallampati: II  TM Distance: >3 FB     Dental  (+) Poor Dentition, Dental Advisory Given, Loose   Pulmonary Current Smoker,    breath sounds clear to auscultation       Cardiovascular hypertension, Pt. on medications + angina + Peripheral Vascular Disease (s/p RLE arteriogram with subsequent popliteal and tibial embolectomy)   Rhythm:Regular Rate:Normal  EF 45-50%, diffuse hypokinesis on ECHO   Neuro/Psych negative neurological ROS  negative psych ROS   GI/Hepatic negative GI ROS, (+)     substance abuse  cocaine use,   Endo/Other  diabetes, Type 2, Insulin Dependent, Oral Hypoglycemic Agents  Renal/GU Renal disease     Musculoskeletal   Abdominal   Peds  Hematology  (+) Blood dyscrasia, anemia ,   Anesthesia Other Findings   Reproductive/Obstetrics                            Anesthesia Physical  Anesthesia Plan  ASA: III and emergent  Anesthesia Plan: General   Post-op Pain Management:    Induction: Intravenous and Rapid sequence  PONV Risk Score and Plan: 2 and Ondansetron, Midazolam and Diphenhydramine  Airway Management Planned: Oral ETT  Additional Equipment:   Intra-op Plan:   Post-operative Plan: Extubation in OR  Informed Consent: I have reviewed the patients History and Physical, chart, labs and discussed the procedure including the risks, benefits and alternatives for the proposed anesthesia with the patient or authorized representative who has indicated his/her understanding and acceptance.   Dental advisory given  Plan Discussed with: CRNA and Anesthesiologist  Anesthesia Plan Comments:         Anesthesia Quick Evaluation

## 2018-09-05 NOTE — Op Note (Signed)
    NAME: Ryan Medina    MRN: [id number] DOB: Mar 05, 1961    DATE OF OPERATION: 09/05/2018  PREOP DIAGNOSIS:    Bleeding from right leg wound  POSTOP DIAGNOSIS:    Same  PROCEDURE:    1.  Evacuation hematoma right leg 2.  Evacuation of lymphocele right leg 3.  4 compartment fasciotomy 4.  Placement of VAC (medial and lateral)  SURGEON: Judeth Cornfield. Scot Dock, MD, FACS  ASSIST: None  ANESTHESIA: General  EBL: 100 cc  INDICATIONS:    Ryan Medina is a 57 y.o. male who had undergone a popliteal and tibial embolectomy for embolic disease to the right lower extremity 2 days ago.  He had some clear lymphatic drainage from his JP drain yesterday but early this morning began developing severe right leg pain with bloody drainage from his JP.  He had significant leg swelling and I recommended urgent exploration and possible fasciotomy.  FINDINGS:   There was mild swelling of the lateral and anterior compartment.  There was mild swelling of the posterior superficial and deep compartments.  These were all decompressed.  A large amount of clear lymphatic fluid was evacuated subcutaneously and then in the deeper space there was significant hematoma evacuated.  No specific site of bleeding was identified.  The patient has been on heparin.  TECHNIQUE:   The patient was taken to the operating room and received a general anesthetic.  The drain was removed.  The right leg was prepped and draped in usual sterile fashion.  The previous incision in the right leg was opened.  Initially I encountered a large pocket of lymphatic fluid which was clear and this was evacuated.  There was also some organized lymphocele.  In the deeper space there was significant hematoma under pressure which was evacuated.  Upon exploration there were several small areas that were oozing and were cauterized but no specific bleeding site was identified.  The suture line for the vein patch was intact with no sites of  bleeding.  Hemostasis was obtained.  I then made a separate longitudinal incision distally to further assess the superficial deep and posterior compartments and there was mild swelling.  The fascia was opened proximally and distally.  I was able to close the skin with staples.  Given the lymphatic drainage from the more proximal incision I elected to place a VAC here as I thought this would help seal any lymphatics.  On the lateral aspect of the leg a longitudinal incision was made in the lateral compartment was opened.  There was mild swelling.  I then dissected anteriorly and the anterior compartment was opened and the fascia opened proximally and distally.  Again there was mild swelling.  Hemostasis was obtained in this wound.  Given the moderate swelling I elected to place a negative pressure dressing on this wound also.  There was a good seal for both of these.  Patient tolerated procedure well was transferred to recovery room in stable condition.  All needle and sponge counts were correct.  [name], MD, FACS [facility]  DATE OF DICTATION:   09/05/2018

## 2018-09-05 NOTE — Progress Notes (Addendum)
Pt placed on hold at this time - PACU RN unable to transport pt to 4E

## 2018-09-06 ENCOUNTER — Inpatient Hospital Stay (HOSPITAL_COMMUNITY): Payer: Medicaid Other

## 2018-09-06 ENCOUNTER — Encounter (HOSPITAL_COMMUNITY): Payer: Self-pay | Admitting: Vascular Surgery

## 2018-09-06 LAB — BASIC METABOLIC PANEL
Anion gap: 13 (ref 5–15)
BUN: 8 mg/dL (ref 6–20)
CO2: 36 mmol/L — ABNORMAL HIGH (ref 22–32)
Calcium: 8.1 mg/dL — ABNORMAL LOW (ref 8.9–10.3)
Chloride: 88 mmol/L — ABNORMAL LOW (ref 98–111)
Creatinine, Ser: 0.83 mg/dL (ref 0.61–1.24)
GFR calc Af Amer: >60 mL/min (ref >60)
Glucose, Bld: 242 mg/dL — ABNORMAL HIGH (ref 70–99)
Potassium: 3.8 mmol/L (ref 3.5–5.1)
Sodium: 137 mmol/L (ref 135–145)

## 2018-09-06 LAB — GLUCOSE, CAPILLARY
GLUCOSE-CAPILLARY: 92 mg/dL (ref 70–99)
Glucose-Capillary: 231 mg/dL — ABNORMAL HIGH (ref 70–99)
Glucose-Capillary: 214 mg/dL — ABNORMAL HIGH (ref 70–99)
Glucose-Capillary: 331 mg/dL — ABNORMAL HIGH (ref 70–99)
Glucose-Capillary: 168 mg/dL — ABNORMAL HIGH (ref 70–99)
Glucose-Capillary: 129 mg/dL — ABNORMAL HIGH (ref 70–99)
Glucose-Capillary: 91 mg/dL (ref 70–99)
Glucose-Capillary: 50 mg/dL — ABNORMAL LOW (ref 70–99)
Glucose-Capillary: 51 mg/dL — ABNORMAL LOW (ref 70–99)
Glucose-Capillary: 58 mg/dL — ABNORMAL LOW (ref 70–99)
Glucose-Capillary: 68 mg/dL — ABNORMAL LOW (ref 70–99)
Glucose-Capillary: 65 mg/dL — ABNORMAL LOW (ref 70–99)
Glucose-Capillary: 131 mg/dL — ABNORMAL HIGH (ref 70–99)

## 2018-09-06 LAB — MAGNESIUM: Magnesium: 2.1 mg/dL (ref 1.7–2.4)

## 2018-09-06 LAB — TYPE AND SCREEN
ABO/RH(D): B POS
Antibody Screen: NEGATIVE
Unit division: 0
Unit division: 0

## 2018-09-06 LAB — CBC
HCT: 29.2 % — ABNORMAL LOW (ref 39.0–52.0)
Hemoglobin: 9.5 g/dL — ABNORMAL LOW (ref 13.0–17.0)
MCH: 28.3 pg (ref 26.0–34.0)
MCHC: 32.5 g/dL (ref 30.0–36.0)
MCV: 86.9 fL (ref 80.0–100.0)
Platelets: 279 10*3/uL (ref 150–400)
RBC: 3.36 MIL/uL — ABNORMAL LOW (ref 4.22–5.81)
RDW: 13 % (ref 11.5–15.5)
WBC: 8.5 10*3/uL (ref 4.0–10.5)
nRBC: 0 % (ref 0.0–0.2)

## 2018-09-06 LAB — POCT I-STAT 4, (NA,K, GLUC, HGB,HCT)
Glucose, Bld: 109 mg/dL — ABNORMAL HIGH (ref 70–99)
HCT: 15 % — ABNORMAL LOW (ref 39.0–52.0)
Hemoglobin: 5.1 g/dL — CL (ref 13.0–17.0)
Potassium: 3.2 mmol/L — ABNORMAL LOW (ref 3.5–5.1)
Sodium: 131 mmol/L — ABNORMAL LOW (ref 135–145)

## 2018-09-06 LAB — BPAM RBC
BLOOD PRODUCT EXPIRATION DATE: 202001022359
Blood Product Expiration Date: 202001022359
ISSUE DATE / TIME: 201912151052
ISSUE DATE / TIME: 201912151052
Unit Type and Rh: 7300
Unit Type and Rh: 7300

## 2018-09-06 LAB — HEPARIN LEVEL (UNFRACTIONATED)
HEPARIN UNFRACTIONATED: 0.1 [IU]/mL — AB (ref 0.30–0.70)
Heparin Unfractionated: <0.1 IU/mL — ABNORMAL LOW (ref 0.30–0.70)
Heparin Unfractionated: 0.4 IU/mL (ref 0.30–0.70)

## 2018-09-06 MED ORDER — DEXTROSE 50 % IV SOLN
INTRAVENOUS | Status: AC
  Administered 2018-09-06: 50 mL
  Filled 2018-09-06: qty 50

## 2018-09-06 MED ORDER — PROMETHAZINE HCL 25 MG/ML IJ SOLN
12.5000 mg | Freq: Once | INTRAMUSCULAR | Status: AC
  Administered 2018-09-06: 12.5 mg via INTRAVENOUS
  Filled 2018-09-06: qty 1

## 2018-09-06 MED ORDER — IOPAMIDOL (ISOVUE-370) INJECTION 76%
INTRAVENOUS | Status: AC
  Administered 2018-09-06: 65 mL
  Filled 2018-09-06: qty 100

## 2018-09-06 MED ORDER — HEPARIN (PORCINE) 25000 UT/250ML-% IV SOLN
1400.0000 [IU]/h | INTRAVENOUS | Status: DC
  Filled 2018-09-06: qty 250

## 2018-09-06 NOTE — Progress Notes (Signed)
ANTICOAGULATION CONSULT NOTE - Follow Up Consult  Pharmacy Consult for Heparin Indication: DVT  Allergies  Allergen Reactions  . Lisinopril Anaphylaxis    Patient Measurements: Height: 5\' 11"  (180.3 cm) Weight: 147 lb 0.8 oz (66.7 kg) IBW/kg (Calculated) : 75.3 Heparin Dosing Weight: 66.7 kg  Vital Signs: Temp: 99 F (37.2 C) (12/16 1923) Temp Source: Oral (12/16 1923) BP: 101/70 (12/16 1923) Pulse Rate: 112 (12/16 1923)  Labs: Recent Labs    09/03/18 2258 09/04/18 0724 09/04/18 1025  09/05/18 0231 09/05/18 1044 09/06/18 0233 09/06/18 1031 09/06/18 1808  HGB 9.1* 7.3*  --   --  7.2* 5.1* 9.5*  --   --   HCT 28.5* 23.4*  --   --  23.1* 15.0* 29.2*  --   --   PLT 292 285  --   --  268  --  279  --   --   HEPARINUNFRC  --  <0.10*  --    < > 0.39  --  <0.10* 0.10* 0.40  CREATININE  --  1.12  --   --  0.78  --  0.83  --   --   TROPONINI <0.03 <0.03 <0.03  --   --   --   --   --   --    < > = values in this interval not displayed.    Estimated Creatinine Clearance: 92.6 mL/min (by C-G formula based on SCr of 0.83 mg/dL).  Assessment: Heparin for LLE DVT/ischemia s/p urgent left popliteal artery embolectomy >> RLE hematoma on 12/15 am s/p evacuation in OR  Goal of Therapy:  Heparin level 0.3-0.5 units/ml Monitor platelets by anticoagulation protocol: Yes   Plan:  Continue IV heparin at 1400 units/hr HL and CBC in AM  Tywan Siever S. Alford Highland, PharmD, BCPS Clinical Staff Pharmacist Eilene Ghazi Stillinger 09/06/2018,8:16 PM

## 2018-09-06 NOTE — Progress Notes (Addendum)
   VASCULAR SURGERY ASSESSMENT & PLAN:   POD 1: Evacuation of lymphocele, evacuation of hematoma, and 4 compartment fasciotomy with placement of VAC dressings POD 3: Right popliteal and tibial embolectomy with bovine pericardial patch angioplasty.  Echocardiogram shows no obvious source of embolization.  Will get chest CT to rule out a thoracic potential source for embolization.  ANTICOAGULATION: Patient is currently on heparin.  He will need to be started on oral anticoagulation which we will need to go slow with given that he will need his fasciotomies closed later in the week.  Will start Coumadin tomorrow afternoon.  I am not sure that he would be a candidate for a DOAC.   SUBJECTIVE:   His right leg feels much better.  PHYSICAL EXAM:   Vitals:   09/05/18 2037 09/05/18 2127 09/05/18 2254 09/06/18 0104  BP: 106/72 107/75  108/79  Pulse:   98 (!) 107  Resp:   20 17  Temp: 99.7 F (37.6 C)   99.2 F (37.3 C)  TempSrc: Oral   Oral  SpO2:   96% 96%  Weight:      Height:       The vacs have a good seal. Palpable right posterior tibial pulse.  LABS:   Lab Results  Component Value Date   WBC 8.5 09/06/2018   HGB 9.5 (L) 09/06/2018   HCT 29.2 (L) 09/06/2018   MCV 86.9 09/06/2018   PLT 279 09/06/2018   Lab Results  Component Value Date   CREATININE 0.83 09/06/2018   Lab Results  Component Value Date   INR 1.03 09/03/2018   CBG (last 3)  Recent Labs    09/05/18 1602 09/05/18 2125 09/06/18 0639  GLUCAP 214* 331* 168*    PROBLEM LIST:    Principal Problem:   Popliteal artery embolus (HCC) Active Problems:   DM (diabetes mellitus), type 2, uncontrolled (HCC)   Essential hypertension   Dilated cardiomyopathy (HCC)   Chest pain   AAA (abdominal aortic aneurysm) (HCC)   Ischemic foot   Right leg claudication (HCC)   PVD (peripheral vascular disease) ([place])   CURRENT MEDS:   . sodium chloride   Intravenous Once  . atorvastatin  20 mg Oral Daily  .  docusate sodium  100 mg Oral Daily  . gabapentin  300 mg Oral BID  . insulin aspart  0-9 Units Subcutaneous TID WC  . insulin detemir  20 Units Subcutaneous QHS  . insulin detemir  30 Units Subcutaneous Daily  . pantoprazole  40 mg Oral Daily    [name] Beeper: [phone] Office: [phone] 09/06/2018

## 2018-09-06 NOTE — Progress Notes (Signed)
Pt c/o acid reflux throughout the night Maalox given with little relief. Urethral  Catheter removed, Patient tolerated well.

## 2018-09-06 NOTE — Progress Notes (Signed)
ANTICOAGULATION CONSULT NOTE - [place] for Heparin  Indication: LLE DVT  Allergies  Allergen Reactions  . Lisinopril Anaphylaxis    Patient Measurements: Height: 5\' 11"  (180.3 cm) Weight: 147 lb 0.8 oz (66.7 kg) IBW/kg (Calculated) : 75.3 Heparin Dosing Weight: 66.7 kg   Vital Signs: Temp: 99.2 F (37.3 C) (12/16 0104) Temp Source: Oral (12/16 0104) BP: 108/79 (12/16 0104) Pulse Rate: 107 (12/16 0104)  Labs: Recent Labs    09/03/18 1325  09/03/18 2258 09/04/18 0724 09/04/18 1025  09/05/18 0231 09/05/18 1044 09/06/18 0233 09/06/18 1031  HGB 9.8*   < > 9.1* 7.3*  --   --  7.2* 5.1* 9.5*  --   HCT 31.9*   < > 28.5* 23.4*  --   --  23.1* 15.0* 29.2*  --   PLT 378  --  292 285  --   --  268  --  279  --   LABPROT 13.4  --   --   --   --   --   --   --   --   --   INR 1.03  --   --   --   --   --   --   --   --   --   HEPARINUNFRC  --   --   --  <0.10*  --    < > 0.39  --  <0.10* 0.10*  CREATININE  --    < >  --  1.12  --   --  0.78  --  0.83  --   TROPONINI  --   --  <0.03 <0.03 <0.03  --   --   --   --   --    < > = values in this interval not displayed.    Estimated Creatinine Clearance: 92.6 mL/min (by C-G formula based on SCr of 0.83 mg/dL).   Medical History: Past Medical History:  Diagnosis Date  . Back pain, chronic   . Diabetes ([place])   . Dilated cardiomyopathy ([place])    Echo 2/18: EF 45-50, diff HK, Gr 1 DD, trivial AI/TR  . HLD (hyperlipidemia)   . Hypertension   . IDDM (insulin dependent diabetes mellitus) ([place])   . Ruptured lumbar disc     Medications:  Infusions:  . sodium chloride    . sodium chloride 100 mL/hr at 09/05/18 2256  . heparin 1,300 Units/hr (09/06/18 0531)  . lactated ringers Stopped (09/03/18 1935)  . magnesium sulfate 1 - 4 g bolus IVPB      Assessment: 57yo male on IV heparin infusion for LLE DVT/ischemia s/p urgent left popliteal artery embolectomy on 09/03/18, POD#3. POD #1 RLE hematoma on  12/15 am s/p evacuation in OR.  Heparin level = 0.1, is subtherapeutic on heparin drip 1300 units/hr. No issues with infusion, IV site good and no bleeding per RN.  Hgb improved 5.1> 9.5 s/p blood transfusion 12/15 and Pltc wnl. We will target heparin level at low end of therapeutic range, keep level 0.3-0.5 due to recent hematoma evacuation.      Goal of Therapy:  Heparin level 0.3-0.5 units/hr  (low end of therapeutic range) Monitor platelets by anticoagulation protocol: Yes   Plan:  Increase heparin to 1400 units/hr Check 6 hour heparin level Daily HL, CBC f/u transition to oral anticoagulation.  Nicole Cella, RPh Clinical Pharmacist [phone] Please check AMION for all [facility] numbers 09/06/2018

## 2018-09-06 NOTE — Progress Notes (Signed)
PROGRESS NOTE    Ryan Medina  EUM:[id number] DOB: [birth year] DOA: 09/03/2018 PCP: Patient, No Pcp Per   Brief Narrative:  57 year old with history of diabetes mellitus type 2, systolic CHF, cocaine use, essential hypertension was recently admitted here for DKA but after going home he developed left lower extremity pain.  Upon admission he was seen by vascular surgeon who performed emergent embolectomy on 12/13.  He was placed on heparin drip.  Following day patient was noted to have bleeding from his right leg therefore he was taken to the OR for evacuation of the right sided hematoma and evaluation for compartment syndrome requiring fasciotomy.   Assessment & Plan:   Principal Problem:   Popliteal artery embolus (HCC) Active Problems:   DM (diabetes mellitus), type 2, uncontrolled ([place])   Essential hypertension   Dilated cardiomyopathy (HCC)   Chest pain   AAA (abdominal aortic aneurysm) (HCC)   Ischemic foot   Right leg claudication (HCC)   PVD (peripheral vascular disease) ([place])  Left popliteal artery embolus status post embolectomy Right lower extremity hematoma, lymphocele requiring compartment fasciotomy 09/06/15 - Status post embolectomy on 12/13 and fasciotomy on 12/15 due to concerns for compartment syndrome. -Plans to start the patient on Coumadin tomorrow afternoon - Vascular surgery team is following  Atypical chest pain secondary to cocaine use; resolved -This is resolved.  Had unremarkable cardiac cath 2 years ago - Last echocardiogram ejection fraction 40-45%  Hypokalemia -Replete PRN  Anemia -Improved, today ~9.5.  Essential hypertension - Holding p.o. amlodipine.  Continue IV hydralazine as needed  Chronic systolic congestive heart failure -Echocardiogram done about 1 week ago showed ejection fraction 40-45%.  Avoid use of beta-blocker due to cocaine use  DVT prophylaxis: On hold due to bleeding Code Status: Full code Family Communication: None at  bedside Disposition Plan: Maintain close inpatient monitoring for his lower extremity.  Consultants:   Vascular surgery  Procedures:   Embolectomy on 12/13  Evacuation of hematoma and fasciotomy on 09/05/18  Antimicrobials:   PeriOp   Subjective: Reports of pain at the surgical site.  General = no fevers, chills, dizziness, malaise, fatigue HEENT/EYES = negative for pain, redness, loss of vision, double vision, blurred vision, loss of hearing, sore throat, hoarseness, dysphagia Cardiovascular= negative for chest pain, palpitation, murmurs, lower extremity swelling Respiratory/lungs= negative for shortness of breath, cough, hemoptysis, wheezing, mucus production Gastrointestinal= negative for nausea, vomiting,, abdominal pain, melena, hematemesis Genitourinary= negative for Dysuria, Hematuria, Change in Urinary Frequency MSK = Negative for arthralgia, myalgias Neurology= Negative for headache, seizures, numbness, tingling  Psychiatry= Negative for anxiety, depression, suicidal and homocidal ideation Allergy/Immunology= Medication/Food allergy as listed  Skin= Negative for Rash, lesions, ulcers, itching    Objective: Vitals:   09/05/18 2037 09/05/18 2127 09/05/18 2254 09/06/18 0104  BP: 106/72 107/75  108/79  Pulse:   98 (!) 107  Resp:   20 17  Temp: 99.7 F (37.6 C)   99.2 F (37.3 C)  TempSrc: Oral   Oral  SpO2:   96% 96%  Weight:      Height:        Intake/Output Summary (Last 24 hours) at 09/06/2018 1349 Last data filed at 09/06/2018 1246 Gross per 24 hour  Intake 2327.54 ml  Output 1200 ml  Net 1127.54 ml   Filed Weights   09/03/18 1650  Weight: 66.7 kg    Examination:  Constitutional: NAD, calm, comfortable Eyes: PERRL, lids and conjunctivae normal ENMT: Mucous membranes are moist. Posterior pharynx clear  of any exudate or lesions.Normal dentition.  Neck: normal, supple, no masses, no thyromegaly Respiratory: clear to auscultation bilaterally,  no wheezing, no crackles. Normal respiratory effort. No accessory muscle use.  Cardiovascular: Regular rate and rhythm, no murmurs / rubs / gallops. No extremity edema. 2+ pedal pulses. No carotid bruits.  Abdomen: no tenderness, no masses palpated. No hepatosplenomegaly. Bowel sounds positive.  Musculoskeletal: no clubbing / cyanosis. No joint deformity upper and lower extremities. Good ROM, no contractures. Normal muscle tone.  Skin: Right lower extremity surgical site noted with drain in place. Neurologic: CN 2-12 grossly intact. Sensation intact, DTR normal. Strength 5/5 in all 4.  Psychiatric: Normal judgment and insight. Alert and oriented x 3. Normal mood.    Data Reviewed:   CBC: Recent Labs  Lab 09/03/18 1325  09/03/18 2258 09/04/18 0724 09/05/18 0231 09/05/18 1044 09/06/18 0233  WBC 9.4  --  7.8 8.4 9.0  --  8.5  NEUTROABS 6.8  --   --   --   --   --   --   HGB 9.8*   < > 9.1* 7.3* 7.2* 5.1* 9.5*  HCT 31.9*   < > 28.5* 23.4* 23.1* 15.0* 29.2*  MCV 88.9  --  88.2 89.0 89.2  --  86.9  PLT 378  --  292 285 268  --  279   < > = values in this interval not displayed.   Basic Metabolic Panel: Recent Labs  Lab 09/03/18 1351  09/03/18 1951 09/03/18 2258 09/04/18 0724 09/05/18 0231 09/05/18 1044 09/06/18 0233  NA 129*   < > 127*  --  133* 131* 131* 137  K 2.4*   < > 2.9*  --  3.1* 3.4* 3.2* 3.8  CL 81*  --   --   --  84* 86*  --  88*  CO2  --   --   --   --  40* 30  --  36*  GLUCOSE 216*   < > 281*  --  227* 196* 109* 242*  BUN 23*  --   --   --  21* 16  --  8  CREATININE 1.10  --   --   --  1.12 0.78  --  0.83  CALCIUM  --   --   --   --  7.9* 7.3*  --  8.1*  MG  --   --   --  2.1 2.0 2.0  --  2.1   < > = values in this interval not displayed.   GFR: Estimated Creatinine Clearance: 92.6 mL/min (by C-G formula based on SCr of 0.83 mg/dL). Liver Function Tests: Recent Labs  Lab 09/04/18 0724  AST 31  ALT 16  ALKPHOS 48  BILITOT 0.5  PROT 4.9*  ALBUMIN 2.5*     No results for input(s): LIPASE, AMYLASE in the last 168 hours. No results for input(s): AMMONIA in the last 168 hours. Coagulation Profile: Recent Labs  Lab 09/03/18 1325  INR 1.03   Cardiac Enzymes: Recent Labs  Lab 09/03/18 2258 09/04/18 0724 09/04/18 1025  TROPONINI <0.03 <0.03 <0.03   BNP (last 3 results) No results for input(s): PROBNP in the last 8760 hours. HbA1C: No results for input(s): HGBA1C in the last 72 hours. CBG: Recent Labs  Lab 09/05/18 1420 09/05/18 1602 09/05/18 2125 09/06/18 0639 09/06/18 0954  GLUCAP 231* 214* 331* 168* 129*   Lipid Profile: No results for input(s): CHOL, HDL, LDLCALC, TRIG, CHOLHDL, LDLDIRECT in the last  72 hours. Thyroid Function Tests: No results for input(s): TSH, T4TOTAL, FREET4, T3FREE, THYROIDAB in the last 72 hours. Anemia Panel: No results for input(s): VITAMINB12, FOLATE, FERRITIN, TIBC, IRON, RETICCTPCT in the last 72 hours. Sepsis Labs: No results for input(s): PROCALCITON, LATICACIDVEN in the last 168 hours.  No results found for this or any previous visit (from the past 240 hour(s)).       Radiology Studies: Ct Angio Chest Pe W Or Wo Contrast  Result Date: 09/06/2018 CLINICAL DATA:  Evaluate thoracic aortic aneurysm. EXAM: CT ANGIOGRAPHY CHEST WITH CONTRAST TECHNIQUE: Multidetector CT imaging of the chest was performed using the standard protocol during bolus administration of intravenous contrast. Multiplanar CT image reconstructions and MIPs were obtained to evaluate the vascular anatomy. CONTRAST:  65 mL ISOVUE-370 IOPAMIDOL (ISOVUE-370) INJECTION 76% COMPARISON:  01/30/2018; 10/14/2016; 04/25/2026 FINDINGS: Vascular Findings: No evidence of thoracic aortic aneurysm with measurements as follows. No definite evidence of thoracic aortic dissection or periaortic stranding on this nongated examination. Bovine configuration of the aortic arch. The branch vessels of the aortic arch appear widely patent throughout  their imaged courses. The descending thoracic aorta is of normal caliber and widely patent without hemodynamically significant stenosis. There is a very minimal amount of atherosclerotic plaque involving the proximal aspect of the descending thoracic aorta. Cardiomegaly.  No pericardial effusion. Although this examination was not tailored for the evaluation the pulmonary arteries, there are no discrete filling defects within the central pulmonary arterial tree to suggest central pulmonary embolism. Borderline enlarged caliber the main pulmonary artery measuring 32 mm in diameter (image 67, series 6) ------------------------------------------------------------- Thoracic aortic measurements: Sinotubular junction 33 mm as measured in greatest oblique coronal dimension. Proximal ascending aorta 35 mm as measured in greatest oblique axial dimension at the level of the main pulmonary artery (image 70, series 6); and approximately 36 mm in greatest oblique short axis coronal diameter (coronal image 48, series 9). Aortic arch aorta 28 mm as measured in greatest oblique sagittal dimension. Proximal descending thoracic aorta 30 mm as measured in greatest oblique axial dimension at the level of the main pulmonary artery. Distal descending thoracic aorta 29 mm as measured in greatest oblique axial dimension at the level of the diaphragmatic hiatus. Review of the MIP images confirms the above findings. ------------------------------------------------------------- Non-Vascular Findings: Mediastinum/Lymph Nodes: Prominent right hilar lymph nodes are not enlarged by size criteria with index right suprahilar lymph node measuring 0.8 cm in greatest short axis diameter (image 71, series 6) and index right infrahilar lymph node measuring 0.7 cm (image 88). No definitive bulky mediastinal, hilar or axillary lymphadenopathy. Lungs/Pleura: Interval development of small bilateral pleural effusions with worsening bibasilar consolidative  opacities and associated air bronchograms, right greater than left. Interval development of an approximately 1.0 x 0.5 cm subpleural consolidative opacity within the right upper lobe (image 36, series 7). Unchanged punctate (approximately 3 mm) calcified granuloma within the left lower lobe (image 75, series 3). Evaluation for additional pulmonary nodules is degraded secondary to bibasilar airspace opacities. The central pulmonary airways appear widely patent. Upper abdomen: Fluid distention of the mid and distal aspects of the esophagus with marked distension of the imaged superior aspect of the stomach. Musculoskeletal: No acute or aggressive osseous abnormalities. There is incomplete fusion involving the spinous processes of multiple thoracic vertebral bodies, unchanged. Regional soft tissues appear normal. Normal appearance of the thyroid gland. IMPRESSION: 1. No evidence of thoracic aortic aneurysm or dissection. 2. Small bilateral effusions with associated bibasilar consolidative opacities with associated air  bronchograms, atelectasis versus infiltrate. Follow-up chest radiograph in 4-6 weeks after treatment is recommended to ensure resolution. 3. Fluid distension of the mid and distal aspects of the esophagus as well as marked distension of the imaged superior aspect stomach - correlation for gastric outlet obstructive symptoms is advised. 4. Cardiomegaly with enlargement of the caliber the main pulmonary artery as could be seen in the setting of pulmonary arterial hypertension. Electronically Signed   By: Sandi Mariscal M.D.   On: 09/06/2018 09:20        Scheduled Meds: . sodium chloride   Intravenous Once  . atorvastatin  20 mg Oral Daily  . docusate sodium  100 mg Oral Daily  . gabapentin  300 mg Oral BID  . insulin aspart  0-9 Units Subcutaneous TID WC  . insulin detemir  20 Units Subcutaneous QHS  . insulin detemir  30 Units Subcutaneous Daily  . pantoprazole  40 mg Oral Daily   Continuous  Infusions: . sodium chloride    . sodium chloride Stopped (09/06/18 0808)  . heparin 1,400 Units/hr (09/06/18 1230)  . lactated ringers Stopped (09/03/18 1935)  . magnesium sulfate 1 - 4 g bolus IVPB       LOS: 3 days   Time spent= 20 mins    Mairead Schwarzkopf Arsenio Loader, MD [facility] Pager [phone]   If 7PM-7AM, please contact night-coverage [email or web address] Password [email or web address] 09/06/2018, 1:49 PM

## 2018-09-06 NOTE — Progress Notes (Signed)
ANTICOAGULATION CONSULT NOTE - Follow Up Consult  Pharmacy Consult for heparin Indication: DVT  Labs: Recent Labs    09/03/18 1325  09/03/18 2258 09/04/18 0724 09/04/18 1025  09/04/18 1839 09/05/18 0231 09/06/18 0233  HGB 9.8*   < > 9.1* 7.3*  --   --   --  7.2* 9.5*  HCT 31.9*   < > 28.5* 23.4*  --   --   --  23.1* 29.2*  PLT 378  --  292 285  --   --   --  268 279  LABPROT 13.4  --   --   --   --   --   --   --   --   INR 1.03  --   --   --   --   --   --   --   --   HEPARINUNFRC  --   --   --  <0.10*  --    < > 0.48 0.39 <0.10*  CREATININE  --    < >  --  1.12  --   --   --  0.78 0.83  TROPONINI  --   --  <0.03 <0.03 <0.03  --   --   --   --    < > = values in this interval not displayed.    Assessment: 57yo male subtherapeutic on heparin after resumed post-op; RN reports no gtt issues or signs of bleeding other than serosanguinous fluid from wound vac.  Goal of Therapy:  Heparin level 0.3-0.5 units/ml   Plan:  Will increase heparin gtt slightly to 1300 units/hr and check level in 6 hours.    Wynona Neat, PharmD, BCPS  09/06/2018,4:41 AM

## 2018-09-07 ENCOUNTER — Inpatient Hospital Stay (HOSPITAL_COMMUNITY): Payer: Medicaid Other

## 2018-09-07 ENCOUNTER — Encounter (HOSPITAL_COMMUNITY): Payer: Self-pay | Admitting: *Deleted

## 2018-09-07 ENCOUNTER — Encounter (HOSPITAL_COMMUNITY)
Admission: EM | Disposition: A | Discharge status: Skilled Nursing Facility | Payer: Self-pay | Source: Home / Self Care | Attending: Internal Medicine

## 2018-09-07 DIAGNOSIS — K920 Hematemesis: Secondary | ICD-10-CM

## 2018-09-07 DIAGNOSIS — R578 Other shock: Secondary | ICD-10-CM | POA: Diagnosis not present

## 2018-09-07 DIAGNOSIS — K209 Esophagitis, unspecified: Secondary | ICD-10-CM

## 2018-09-07 DIAGNOSIS — R935 Abnormal findings on diagnostic imaging of other abdominal regions, including retroperitoneum: Secondary | ICD-10-CM

## 2018-09-07 HISTORY — PX: IR US GUIDE VASC ACCESS RIGHT: IMG2390

## 2018-09-07 HISTORY — PX: IR ANGIOGRAM SELECTIVE EACH ADDITIONAL VESSEL: IMG667

## 2018-09-07 HISTORY — PX: ESOPHAGOGASTRODUODENOSCOPY: SHX5428

## 2018-09-07 HISTORY — PX: IR ANGIOGRAM VISCERAL SELECTIVE: IMG657

## 2018-09-07 HISTORY — PX: IR EMBO ART  VEN HEMORR LYMPH EXTRAV  INC GUIDE ROADMAPPING: IMG5450

## 2018-09-07 LAB — BLOOD GAS, ARTERIAL
ACID-BASE DEFICIT: 2.5 mmol/L — AB (ref 0.0–2.0)
Bicarbonate: 21.3 mmol/L (ref 20.0–28.0)
Drawn by: 280981
O2 CONTENT: 3 L/min
O2 Saturation: 94.3 %
Patient temperature: 98.6
pCO2 arterial: 34.1 mmHg (ref 32.0–48.0)
pH, Arterial: 7.413 (ref 7.350–7.450)
pO2, Arterial: 67.9 mmHg — ABNORMAL LOW (ref 83.0–108.0)

## 2018-09-07 LAB — BASIC METABOLIC PANEL
Anion gap: 10 (ref 5–15)
BUN: 26 mg/dL — ABNORMAL HIGH (ref 6–20)
CHLORIDE: 96 mmol/L — AB (ref 98–111)
CO2: 30 mmol/L (ref 22–32)
Calcium: 7.2 mg/dL — ABNORMAL LOW (ref 8.9–10.3)
Creatinine, Ser: 0.78 mg/dL (ref 0.61–1.24)
GFR calc non Af Amer: >60 mL/min (ref >60)
Glucose, Bld: 112 mg/dL — ABNORMAL HIGH (ref 70–99)
Potassium: 3.9 mmol/L (ref 3.5–5.1)
Sodium: 136 mmol/L (ref 135–145)

## 2018-09-07 LAB — CBC WITH DIFFERENTIAL/PLATELET
Abs Immature Granulocytes: 0 10*3/uL (ref 0.00–0.07)
Band Neutrophils: 16 %
Basophils Absolute: 0 10*3/uL (ref 0.0–0.1)
Basophils Relative: 0 %
Eosinophils Absolute: 0 10*3/uL (ref 0.0–0.5)
Eosinophils Relative: 0 %
HCT: 20.3 % — ABNORMAL LOW (ref 39.0–52.0)
HEMOGLOBIN: 6.8 g/dL — AB (ref 13.0–17.0)
Lymphocytes Relative: 3 %
Lymphs Abs: 0.3 10*3/uL — ABNORMAL LOW (ref 0.7–4.0)
MCH: 28.3 pg (ref 26.0–34.0)
MCHC: 33.5 g/dL (ref 30.0–36.0)
MCV: 84.6 fL (ref 80.0–100.0)
MONO ABS: 0.1 10*3/uL (ref 0.1–1.0)
Monocytes Relative: 1 %
Neutro Abs: 10.4 10*3/uL — ABNORMAL HIGH (ref 1.7–7.7)
Neutrophils Relative %: 80 %
Platelets: 216 10*3/uL (ref 150–400)
RBC: 2.4 MIL/uL — ABNORMAL LOW (ref 4.22–5.81)
RDW: 15.1 % (ref 11.5–15.5)
WBC: 10.8 10*3/uL — ABNORMAL HIGH (ref 4.0–10.5)
nRBC: 0.2 % (ref 0.0–0.2)

## 2018-09-07 LAB — CBC
HCT: 18.9 % — ABNORMAL LOW (ref 39.0–52.0)
Hemoglobin: 5.9 g/dL — CL (ref 13.0–17.0)
MCH: 27.8 pg (ref 26.0–34.0)
MCHC: 31.2 g/dL (ref 30.0–36.0)
MCV: 89.2 fL (ref 80.0–100.0)
NRBC: 0 % (ref 0.0–0.2)
Platelets: 312 10*3/uL (ref 150–400)
RBC: 2.12 MIL/uL — ABNORMAL LOW (ref 4.22–5.81)
RDW: 13.2 % (ref 11.5–15.5)
WBC: 11.6 10*3/uL — ABNORMAL HIGH (ref 4.0–10.5)

## 2018-09-07 LAB — COMPREHENSIVE METABOLIC PANEL
ALT: 15 U/L (ref 0–44)
AST: 26 U/L (ref 15–41)
Albumin: 1.4 g/dL — ABNORMAL LOW (ref 3.5–5.0)
Alkaline Phosphatase: 24 U/L — ABNORMAL LOW (ref 38–126)
Anion gap: 7 (ref 5–15)
BUN: 39 mg/dL — ABNORMAL HIGH (ref 6–20)
CO2: 28 mmol/L (ref 22–32)
Calcium: 6.4 mg/dL — CL (ref 8.9–10.3)
Chloride: 104 mmol/L (ref 98–111)
Creatinine, Ser: 0.94 mg/dL (ref 0.61–1.24)
GFR calc Af Amer: >60 mL/min (ref >60)
GFR calc non Af Amer: >60 mL/min (ref >60)
Glucose, Bld: 231 mg/dL — ABNORMAL HIGH (ref 70–99)
Potassium: 4.1 mmol/L (ref 3.5–5.1)
SODIUM: 139 mmol/L (ref 135–145)
Total Bilirubin: 0.6 mg/dL (ref 0.3–1.2)
Total Protein: 3.3 g/dL — ABNORMAL LOW (ref 6.5–8.1)

## 2018-09-07 LAB — POCT I-STAT 3, ART BLOOD GAS (G3+)
Acid-Base Excess: 7 mmol/L — ABNORMAL HIGH (ref 0.0–2.0)
Bicarbonate: 30.4 mmol/L — ABNORMAL HIGH (ref 20.0–28.0)
O2 Saturation: 100 %
PCO2 ART: 34.1 mmHg (ref 32.0–48.0)
Patient temperature: 37
TCO2: 31 mmol/L (ref 22–32)
pH, Arterial: 7.559 — ABNORMAL HIGH (ref 7.350–7.450)
pO2, Arterial: 317 mmHg — ABNORMAL HIGH (ref 83.0–108.0)

## 2018-09-07 LAB — GLUCOSE, CAPILLARY
GLUCOSE-CAPILLARY: 98 mg/dL (ref 70–99)
Glucose-Capillary: 105 mg/dL — ABNORMAL HIGH (ref 70–99)
Glucose-Capillary: 235 mg/dL — ABNORMAL HIGH (ref 70–99)
Glucose-Capillary: 264 mg/dL — ABNORMAL HIGH (ref 70–99)
Glucose-Capillary: 221 mg/dL — ABNORMAL HIGH (ref 70–99)
Glucose-Capillary: 184 mg/dL — ABNORMAL HIGH (ref 70–99)

## 2018-09-07 LAB — AMMONIA: AMMONIA: 25 umol/L (ref 9–35)

## 2018-09-07 LAB — PROTIME-INR
INR: 1.28
Prothrombin Time: 15.9 seconds — ABNORMAL HIGH (ref 11.4–15.2)

## 2018-09-07 LAB — LACTIC ACID, PLASMA: Lactic Acid, Venous: 1.7 mmol/L (ref 0.5–1.9)

## 2018-09-07 LAB — PREPARE RBC (CROSSMATCH)

## 2018-09-07 LAB — HEPARIN LEVEL (UNFRACTIONATED): HEPARIN UNFRACTIONATED: 0.67 [IU]/mL (ref 0.30–0.70)

## 2018-09-07 LAB — TRIGLYCERIDES: Triglycerides: 107 mg/dL (ref <150)

## 2018-09-07 LAB — MAGNESIUM: Magnesium: 2 mg/dL (ref 1.7–2.4)

## 2018-09-07 SURGERY — EGD (ESOPHAGOGASTRODUODENOSCOPY)
Anesthesia: Moderate Sedation

## 2018-09-07 MED ORDER — DILTIAZEM HCL 25 MG/5ML IV SOLN
10.0000 mg | Freq: Once | INTRAVENOUS | Status: DC
  Filled 2018-09-07: qty 5

## 2018-09-07 MED ORDER — ORAL CARE MOUTH RINSE
15.0000 mL | OROMUCOSAL | Status: DC
  Administered 2018-09-07 – 2018-09-08 (×6): 15 mL via OROMUCOSAL

## 2018-09-07 MED ORDER — MIDAZOLAM HCL 2 MG/2ML IJ SOLN
2.0000 mg | Freq: Once | INTRAMUSCULAR | Status: AC
  Administered 2018-09-07: 2 mg via INTRAVENOUS

## 2018-09-07 MED ORDER — PHENYLEPHRINE HCL-NACL 10-0.9 MG/250ML-% IV SOLN
0.0000 ug/min | INTRAVENOUS | Status: DC
  Administered 2018-09-07: 50 ug/min via INTRAVENOUS
  Administered 2018-09-07: 70 ug/min via INTRAVENOUS
  Administered 2018-09-07: 50 ug/min via INTRAVENOUS
  Administered 2018-09-08: 100 ug/min via INTRAVENOUS
  Administered 2018-09-08: 80 ug/min via INTRAVENOUS
  Administered 2018-09-08: 130 ug/min via INTRAVENOUS
  Administered 2018-09-08: 160 ug/min via INTRAVENOUS
  Filled 2018-09-07 (×11): qty 250

## 2018-09-07 MED ORDER — IPRATROPIUM-ALBUTEROL 0.5-2.5 (3) MG/3ML IN SOLN
3.0000 mL | Freq: Four times a day (QID) | RESPIRATORY_TRACT | Status: DC
  Administered 2018-09-07 – 2018-09-08 (×4): 3 mL via RESPIRATORY_TRACT
  Filled 2018-09-07 (×4): qty 3

## 2018-09-07 MED ORDER — MIDAZOLAM HCL 2 MG/2ML IJ SOLN: 1.0000 mg | INTRAMUSCULAR | Status: DC | PRN

## 2018-09-07 MED ORDER — POLYETHYLENE GLYCOL 3350 17 G PO PACK
17.0000 g | PACK | Freq: Every day | ORAL | Status: DC | PRN
  Administered 2018-09-24 – 2018-09-25 (×2): 17 g via ORAL
  Filled 2018-09-07 (×2): qty 1

## 2018-09-07 MED ORDER — SODIUM CHLORIDE 0.9 % IV SOLN
1.0000 g | Freq: Once | INTRAVENOUS | Status: AC
  Administered 2018-09-07: 1 g via INTRAVENOUS
  Filled 2018-09-07: qty 10

## 2018-09-07 MED ORDER — SODIUM BICARBONATE 8.4 % IV SOLN
INTRAVENOUS | Status: AC
  Administered 2018-09-07: 12:00:00
  Filled 2018-09-07: qty 50

## 2018-09-07 MED ORDER — FENTANYL CITRATE (PF) 100 MCG/2ML IJ SOLN: 100.0000 ug | Freq: Once | INTRAMUSCULAR | Status: DC

## 2018-09-07 MED ORDER — SODIUM CHLORIDE 0.9% IV SOLUTION: Freq: Once | INTRAVENOUS | Status: DC

## 2018-09-07 MED ORDER — SODIUM CHLORIDE 0.9 % IV SOLN
8.0000 mg/h | INTRAVENOUS | Status: DC
  Administered 2018-09-07 – 2018-09-09 (×4): 8 mg/h via INTRAVENOUS
  Filled 2018-09-07 (×8): qty 80

## 2018-09-07 MED ORDER — PROPOFOL 1000 MG/100ML IV EMUL
0.0000 ug/kg/min | INTRAVENOUS | Status: DC
  Administered 2018-09-07: 50 ug/kg/min via INTRAVENOUS
  Administered 2018-09-07: 37.481 ug/kg/min via INTRAVENOUS
  Administered 2018-09-07: 10 ug/kg/min via INTRAVENOUS
  Administered 2018-09-08: 35 ug/kg/min via INTRAVENOUS
  Filled 2018-09-07 (×4): qty 100

## 2018-09-07 MED ORDER — FENTANYL 2500MCG IN NS 250ML (10MCG/ML) PREMIX INFUSION
25.0000 ug/h | INTRAVENOUS | Status: DC
  Administered 2018-09-07 (×2): 200 ug/h via INTRAVENOUS
  Filled 2018-09-07 (×4): qty 250

## 2018-09-07 MED ORDER — FENTANYL CITRATE (PF) 100 MCG/2ML IJ SOLN
100.0000 ug | INTRAMUSCULAR | Status: DC | PRN
  Administered 2018-09-08: 25 ug via INTRAVENOUS
  Filled 2018-09-07: qty 2

## 2018-09-07 MED ORDER — INSULIN DETEMIR 100 UNIT/ML ~~LOC~~ SOLN
20.0000 [IU] | Freq: Two times a day (BID) | SUBCUTANEOUS | Status: DC
  Administered 2018-09-07: 20 [IU] via SUBCUTANEOUS
  Filled 2018-09-07 (×2): qty 0.2

## 2018-09-07 MED ORDER — SENNOSIDES-DOCUSATE SODIUM 8.6-50 MG PO TABS: 1.0000 | ORAL_TABLET | Freq: Every evening | ORAL | Status: DC | PRN

## 2018-09-07 MED ORDER — ETOMIDATE 2 MG/ML IV SOLN
0.3000 mg/kg | Freq: Once | INTRAVENOUS | Status: AC
  Administered 2018-09-07: 20 mg via INTRAVENOUS

## 2018-09-07 MED ORDER — SODIUM CHLORIDE 0.9% IV SOLUTION
Freq: Once | INTRAVENOUS | Status: AC
  Administered 2018-09-07: 20:00:00 via INTRAVENOUS

## 2018-09-07 MED ORDER — LIDOCAINE HCL 1 % IJ SOLN
INTRAMUSCULAR | Status: AC
  Filled 2018-09-07: qty 20

## 2018-09-07 MED ORDER — FENTANYL CITRATE (PF) 100 MCG/2ML IJ SOLN
50.0000 ug | Freq: Once | INTRAMUSCULAR | Status: AC
  Administered 2018-09-07: 50 ug via INTRAVENOUS

## 2018-09-07 MED ORDER — METOCLOPRAMIDE HCL 5 MG/ML IJ SOLN
20.0000 mg | Freq: Once | INTRAVENOUS | Status: AC
  Administered 2018-09-07: 20 mg via INTRAVENOUS
  Filled 2018-09-07: qty 4

## 2018-09-07 MED ORDER — CHLORHEXIDINE GLUCONATE 0.12% ORAL RINSE (MEDLINE KIT)
15.0000 mL | Freq: Two times a day (BID) | OROMUCOSAL | Status: DC
  Administered 2018-09-08 – 2018-09-20 (×11): 15 mL via OROMUCOSAL

## 2018-09-07 MED ORDER — SODIUM CHLORIDE 0.9 % IV BOLUS
1000.0000 mL | Freq: Once | INTRAVENOUS | Status: AC
  Administered 2018-09-07: 1000 mL via INTRAVENOUS

## 2018-09-07 MED ORDER — FENTANYL BOLUS VIA INFUSION
50.0000 ug | INTRAVENOUS | Status: DC | PRN
  Filled 2018-09-07: qty 50

## 2018-09-07 MED ORDER — SODIUM CHLORIDE 0.9 % IV SOLN
250.0000 mg | Freq: Once | INTRAVENOUS | Status: AC
  Administered 2018-09-07: 250 mg via INTRAVENOUS
  Filled 2018-09-07 (×2): qty 5

## 2018-09-07 MED ORDER — ROCURONIUM BROMIDE 50 MG/5ML IV SOLN
1.0000 mg/kg | Freq: Once | INTRAVENOUS | Status: AC
  Administered 2018-09-07: 20 mg via INTRAVENOUS

## 2018-09-07 MED ORDER — SODIUM CHLORIDE 0.9 % IV SOLN
80.0000 mg | Freq: Once | INTRAVENOUS | Status: AC
  Administered 2018-09-07: 80 mg via INTRAVENOUS
  Filled 2018-09-07: qty 80

## 2018-09-07 MED ORDER — FENTANYL CITRATE (PF) 100 MCG/2ML IJ SOLN
100.0000 ug | INTRAMUSCULAR | Status: DC | PRN
  Administered 2018-09-07 (×2): 100 ug via INTRAVENOUS
  Filled 2018-09-07 (×2): qty 2

## 2018-09-07 MED ORDER — MIDAZOLAM HCL 2 MG/2ML IJ SOLN
1.0000 mg | INTRAMUSCULAR | Status: DC | PRN
  Administered 2018-09-07 – 2018-09-08 (×2): 1 mg via INTRAVENOUS
  Filled 2018-09-07 (×2): qty 2

## 2018-09-07 MED ORDER — PANTOPRAZOLE SODIUM 40 MG IV SOLR: 40.0000 mg | Freq: Two times a day (BID) | INTRAVENOUS | Status: DC

## 2018-09-07 MED ORDER — IOPAMIDOL (ISOVUE-300) INJECTION 61%
INTRAVENOUS | Status: AC
  Filled 2018-09-07: qty 200

## 2018-09-07 NOTE — Sedation Documentation (Signed)
Patient is resting comfortably. 

## 2018-09-07 NOTE — Procedures (Signed)
Interventional Radiology Procedure Note  Procedure: Mesenteric angiogram and coil embolization of the GDA  Complications: None  Estimated Blood Loss: None  Recommendations: - Trend H&H and transfuse as needed - Leg straight x 4 hours  Signed,  Criselda Peaches, MD

## 2018-09-07 NOTE — Progress Notes (Signed)
Pt received from floor per bed accepted into 3MW bed 3 alert but not oriented - large amount of GI bleed - UPC currently infusing

## 2018-09-07 NOTE — Sedation Documentation (Signed)
Pt remains stable. No sedation given at this time.

## 2018-09-07 NOTE — Progress Notes (Signed)
Patient A+Ox1 and lethargic on assessment, HGB 5.9 1 Unit of blood started @ 8AM, NS @ 100. Large amount of blood seen between legs from rectum w/ blood clot. Heparin drip stopped Rapid and MD paged.

## 2018-09-07 NOTE — H&P (Addendum)
GASTROENTEROLOGY OUTPATIENT PROCEDURE H&P NOTE   Primary Care Physician: Patient, No Pcp Per  HPI: Ryan Medina is a 57 y.o. male who presents for EGD  Past Medical History:  Diagnosis Date  . Back pain, chronic   . Diabetes ([place])   . Dilated cardiomyopathy ([place])    Echo 2/18: EF 45-50, diff HK, Gr 1 DD, trivial AI/TR  . HLD (hyperlipidemia)   . Hypertension   . IDDM (insulin dependent diabetes mellitus) ([place])   . Ruptured lumbar disc    Past Surgical History:  Procedure Laterality Date  . ABDOMINAL AORTOGRAM W/LOWER EXTREMITY Right 09/03/2018   Procedure: ABDOMINAL AORTOGRAM W/LOWER EXTREMITY;  Surgeon: Angelia Mould, MD;  Location: [facility];  Service: Cardiovascular;  Laterality: Right;  . ABDOMINAL SURGERY    . APPENDECTOMY    . APPLICATION OF WOUND VAC Right 09/05/2018   Procedure: APPLICATION OF WOUND VAC RIGHT LOWER MEDIAL AND LATERAL FASCIOTOMY SITE;  Surgeon: Angelia Mould, MD;  Location: [facility];  Service: Vascular;  Laterality: Right;  . BACK SURGERY    . CARDIAC CATHETERIZATION N/A 05/19/2016   Procedure: Left Heart Cath and Coronary Angiography;  Surgeon: Leonie Man, MD;  Location: [facility];  Service: Cardiovascular;  Laterality: N/A;  . EMBOLECTOMY Right 09/03/2018   Procedure: Embolectomy Right Popliteal and Tibial Artery, Bovine Pericardium Patch Angioplasty Right Popliteal Artery;  Surgeon: Angelia Mould, MD;  Location: [facility];  Service: Vascular;  Laterality: Right;  . FASCIOTOMY Right 09/05/2018   Procedure: FOUR COMPARTMENT FASCIOTOMY RIGHT LOWER LEG ;  Surgeon: Angelia Mould, MD;  Location: [facility];  Service: Vascular;  Laterality: Right;  . HEMATOMA EVACUATION Right 09/05/2018   Procedure: EVACUATION HEMATOMA OF RIGHT LOWER EXTREMITY; EVACUATION OF LYMPHOCELE RIGHT LOWER LEG ;  Surgeon: Angelia Mould, MD;  Location[facility];  Service: Vascular;  Laterality: Right;   Current  Facility-Administered Medications  Medication Dose Route Frequency Provider Last Rate Last Dose  . 0.9 %  sodium chloride infusion (Manually program via Guardrails IV Fluids)   Intravenous Once Angelia Mould, MD      . 0.9 %  sodium chloride infusion (Manually program via Guardrails IV Fluids)   Intravenous Once Bodenheimer, Charles A, NP      . 0.9 %  sodium chloride infusion  500 mL Intravenous Once PRN Angelia Mould, MD      . 0.9 %  sodium chloride infusion   Intravenous Continuous Angelia Mould, MD 100 mL/hr at 09/07/18 [phone]    . acetaminophen (TYLENOL) tablet 325-650 mg  325-650 mg Oral Q4H PRN Angelia Mould, MD       Or  . acetaminophen (TYLENOL) suppository 325-650 mg  325-650 mg Rectal Q4H PRN Angelia Mould, MD      . atorvastatin (LIPITOR) tablet 20 mg  20 mg Oral Daily Angelia Mould, MD   20 mg at 09/07/18 0815  . chlorhexidine gluconate (MEDLINE KIT) (PERIDEX) 0.12 % solution 15 mL  15 mL Mouth Rinse BID Jennelle Human B, NP      . erythromycin 250 mg in sodium chloride 0.9 % 100 mL IVPB  250 mg Intravenous Once Gribbin, Sarah J, PA-C      . fentaNYL (SUBLIMAZE) bolus via infusion 50 mcg  50 mcg Intravenous Q1H PRN Jennelle Human B, NP      . fentaNYL (SUBLIMAZE) injection 100 mcg  100 mcg Intravenous Q15 min PRN Alphonzo Grieve, MD  100 mcg at 09/07/18 1226  . fentaNYL (SUBLIMAZE) injection 100 mcg  100 mcg Intravenous Q2H PRN Alphonzo Grieve, MD      . fentaNYL (SUBLIMAZE) injection 100 mcg  100 mcg Intravenous Once Brand Males, MD      . fentaNYL 2547mg in NS 2572m(1073mml) infusion-PREMIX  25-400 mcg/hr Intravenous Continuous SimJennelle Human NP 30 mL/hr at 09/07/18 1257 300 mcg/hr at 09/07/18 1257  . insulin aspart (novoLOG) injection 0-9 Units  0-9 Units Subcutaneous TID WC DicAngelia MouldD   3 Units at 09/07/18 1101  . insulin detemir (LEVEMIR) injection 20 Units  20 Units Subcutaneous BID SimJennelle Human, NP      . ipratropium-albuterol (DUONEB) 0.5-2.5 (3) MG/3ML nebulizer solution 3 mL  3 mL Nebulization Q6H SimJennelle Human NP   3 mL at 09/07/18 1419  . magnesium sulfate IVPB 2 g 50 mL  2 g Intravenous Daily PRN DicAngelia MouldD      . MEDLINE mouth rinse  15 mL Mouth Rinse 10 times per day SimJennelle Human NP      . midazolam (VERSED) injection 1 mg  1 mg Intravenous Q15 min PRN SimJennelle Human NP      . midazolam (VERSED) injection 1 mg  1 mg Intravenous Q2H PRN SimJennelle Human NP      . ondansetron (ZOFRAN) tablet 4 mg  4 mg Oral Q6H PRN DicAngelia MouldD       Or  . ondansetron (ZOMckenzie Surgery Center LPnjection 4 mg  4 mg Intravenous Q6H PRN DicAngelia MouldD   4 mg at 09/05/18 2257  . pantoprazole (PROTONIX) 80 mg in sodium chloride 0.9 % 250 mL (0.32 mg/mL) infusion  8 mg/hr Intravenous Continuous Amin, Ankit Chirag, MD 25 mL/hr at 09/07/18 1140 8 mg/hr at 09/07/18 1140  . phenylephrine (NEOSYNEPHRINE) 10-0.9 MG/250ML-% infusion  0-200 mcg/min Intravenous Titrated SimJennelle Human NP 75 mL/hr at 09/07/18 1133 50 mcg/min at 09/07/18 1133  . polyethylene glycol (MIRALAX / GLYCOLAX) packet 17 g  17 g Oral Daily PRN Amin, Ankit Chirag, MD      . potassium chloride SA (K-DUR,KLOR-CON) CR tablet 20-40 mEq  20-40 mEq Oral Daily PRN DicAngelia MouldD      . propofol (DIPRIVAN) 1000 MG/100ML infusion  0-50 mcg/kg/min Intravenous Continuous SvaAlphonzo GrieveD 4 mL/hr at 09/07/18 1402 10 mcg/kg/min at 09/07/18 1402  . senna-docusate (Senokot-S) tablet 1 tablet  1 tablet Oral QHS PRN Amin, Ankit Chirag, MD      . sodium bicarbonate 1 mEq/mL injection            Allergies  Allergen Reactions  . Lisinopril Anaphylaxis   Family History  Problem Relation Age of Onset  . Diabetes Mother   . Heart failure Mother   . Diabetes Sister   . Diabetes Brother    Social History   Socioeconomic History  . Marital status: Single    Spouse name: Not on file  . Number of  children: Not on file  . Years of education: Not on file  . Highest education level: Not on file  Occupational History  . Not on file  Social Needs  . Financial resource strain: Not on file  . Food insecurity:    Worry: Not on file    Inability: Not on file  . Transportation needs:    Medical: Not on file    Non-medical: Not on file  Tobacco Use  .  Smoking status: Current Every Day Smoker    Packs/day: 0.25    Years: 25.00    Pack years: 6.25    Types: Cigarettes  . Smokeless tobacco: Never Used  . Tobacco comment: 2 per day   Substance and Sexual Activity  . Alcohol use: No    Frequency: Never  . Drug use: No  . Sexual activity: Yes  Lifestyle  . Physical activity:    Days per week: Not on file    Minutes per session: Not on file  . Stress: Not on file  Relationships  . Social connections:    Talks on phone: Not on file    Gets together: Not on file    Attends religious service: Not on file    Active member of club or organization: Not on file    Attends meetings of clubs or organizations: Not on file    Relationship status: Not on file  . Intimate partner violence:    Fear of current or ex partner: Not on file    Emotionally abused: Not on file    Physically abused: Not on file    Forced sexual activity: Not on file  Other Topics Concern  . Not on file  Social History Narrative  . Not on file    Physical Exam: Vital signs in last 24 hours: Temp:  [97.5 F (36.4 C)-100 F (37.8 C)] 99.7 F (37.6 C) (12/17 1400) Pulse Rate:  [111-133] 117 (12/17 1153) Resp:  [13-31] 14 (12/17 1400) BP: (64-120)/(50-84) 103/71 (12/17 1400) SpO2:  [94 %-100 %] 94 % (12/17 1422) FiO2 (%):  [40 %-100 %] 40 % (12/17 1422) Last BM Date: 09/07/18(GI Bleed ) GEN: Intubated, on Pressors EYE: Sclerae anicteric ENT: Dry MM CV: Tachycardic RESP: Ventilatory sounds present GI: Protuberant NEURO:  Sedated  Lab Results: Recent Labs    09/05/18 0231 09/05/18 1044  09/06/18 0233 09/07/18 0349  WBC 9.0  --  8.5 11.6*  HGB 7.2* 5.1* 9.5* 5.9*  HCT 23.1* 15.0* 29.2* 18.9*  PLT 268  --  279 312   BMET Recent Labs    09/05/18 0231 09/05/18 1044 09/06/18 0233 09/07/18 0349  NA 131* 131* 137 136  K 3.4* 3.2* 3.8 3.9  CL 86*  --  88* 96*  CO2 30  --  36* 30  GLUCOSE 196* 109* 242* 112*  BUN 16  --  8 26*  CREATININE 0.78  --  0.83 0.78  CALCIUM 7.3*  --  8.1* 7.2*   LFT No results for input(s): PROT, ALBUMIN, AST, ALT, ALKPHOS, BILITOT, BILIDIR, IBILI in the last 72 hours. PT/INR No results for input(s): LABPROT, INR in the last 72 hours.   Impression / Plan: This is a 57 y.o.male who presents for EGD in setting of severe anemia.  The risks and benefits of endoscopic evaluation were discussed with the patient's sister.  These risks include but are not limited to the risk of perforation, infection, bleeding, missed lesions, lack of diagnosis, severe illness requiring hospitalization, as well as anesthesia and sedation related illnesses.    In regards to the clinical stability of this patient, 2 physicians also agree that an emergent endoscopy needs to be performed.  We will proceed in this case with both consents being done in this manner.   Justice Britain, MD [facility] Advanced Endoscopy Office # [phone]

## 2018-09-07 NOTE — Consult Note (Signed)
NAME:  Ryan Medina, MRN:  [id number], DOB:  28-Sep-1960, LOS: 4 ADMISSION DATE:  09/03/2018, CONSULTATION DATE:  09/07/2018 REFERRING MD:  Dr. Reesa Chew, CHIEF COMPLAINT:  GIB/ hypotension   Brief History   31 yoM admitted with LLE pain found to have ischemic right leg and acute popliteal and tibial embolus s/p embolectomy 12/13.  On heparin gtt.  Developed acute RLL hematoma with compartment syndrome requiring fasciotomy 12/15.  Decompensated 12/17 am with hypotension with abd distention and 2 massive bloody stools.  Hgb drop 9.5 -> 5.9.  Getting 2 units PRBC, protonix, and GI consulted.  Moved to ICU for hemodynamic instability.   History of present illness   57 year old male with PMH Of DMT2, systolic HF EF ~86%, HTN, cocaine abuse who was recently hospitalized with DKA returns on 12/13 with left lower extremity pain found to have acute popliteal and tibial embolus with ischemic leg.  Underwent emergent embolectomy on 12/13 and placed on heparin.  On 12/15, he was noted to have bleeding from his right leg found to have development of large hematoma with compartment syndrome and taken emergently back to OR for fasciotomy with wound vac x 2 left in place.  Overnight, patient not to be more lethargic and hypotension.  Found to have acute drop in Hgb from 9.5 to 5.9.  Heparin gtt stopped this morning, given NS bolus and transfused with 1 unit PRBC.  Patient then had 2 large bloody bowel movements, noted to have tender distended abd, and spitting up coffee ground secretions.  GI consulted.  PCCM consulted for acute hemodynamic instability.   Past Medical History  DMT2, systolic HF EF ~76%, DCM, HTN, cocaine abuse   Significant Hospital Events   12/13 Admitted   Consults:  Vascular GI PCCM  Procedures:  12/13 LLE embolectomy  12/15 RLE fasciotomy   Significant Diagnostic Tests:  12/14 TTE >> - Left ventricle: Wall thickness was increased in a pattern of mild   LVH. Systolic function was  mildly reduced. The estimated ejection   fraction was in the range of 45% to 50%. Wall motion was normal;   there were no regional wall motion abnormalities. Doppler   parameters are consistent with abnormal left ventricular   relaxation (grade 1 diastolic dysfunction). - Aortic valve: There was trivial regurgitation. - Mitral valve: Mildly calcified annulus. There was mild   regurgitation. - Right atrium: Central venous pressure (est): 3 mm Hg. - Tricuspid valve: There was mild regurgitation. - Pulmonary arteries: Systolic pressure could not be accurately   estimated. - Pericardium, extracardiac: There was no pericardial effusion.  12/16 CTA PE >> 1. No evidence of thoracic aortic aneurysm or dissection. 2. Small bilateral effusions with associated bibasilar consolidative opacities with associated air bronchograms, atelectasis versus infiltrate. Follow-up chest radiograph in 4-6 weeks after treatment is recommended to ensure resolution. 3. Fluid distension of the mid and distal aspects of the esophagus as well as marked distension of the imaged superior aspect stomach - correlation for gastric outlet obstructive symptoms is advised. 4. Cardiomegaly with enlargement of the caliber the main pulmonary artery as could be seen in the setting of pulmonary arterial hypertension.  Micro Data:   Antimicrobials:  Pre op ancef 12/13 and 12/15  Interim history/subjective:  Completed 1 unit of PRBC  Objective   Blood pressure 120/75, pulse (!) 118, temperature (!) 97.5 F (36.4 C), temperature source Oral, resp. rate (!) 21, height 5\' 11"  (1.803 m), weight 66.7 kg, SpO2 99 %.  Intake/Output Summary (Last 24 hours) at 09/07/2018 1030 Last data filed at 09/07/2018 1000 Gross per 24 hour  Intake 5075.26 ml  Output 700 ml  Net 4375.26 ml   Filed Weights   09/03/18 1650  Weight: 66.7 kg   Examination: General:  Ill appearing adult male lying in bed in pool of melena HEENT: MM  pale/dry, coffee grounds emesis noted in mouth, no JVD, pupils 3/reactive Neuro: lethargic, will follow some commands, move all extremities weakly, answers simple questions CV: ST, weak peripheral pulses PULM: even/non-labored, lungs bilaterally coarse, on current exam, unable to cough GI: distended, taut, +BS  Extremities: warm/dry, no edema, RLE with wound vac x 2 Skin: no rashes   Resolved Hospital Problem list    Assessment & Plan:  Acute hemorraghic shock related to acute GIB- with coffee ground emesis and melena stools - also appeared to have GI outlet obstruction on CT chest  P:  tx ICU GI consulted, will need intubation for EGD/ procedure  Getting 2 unit of PRBC, will keep 2 ahead Goal MAP > 65 Need volume replacement with PRBC, limit IVF Transfuse for Hgb <7 Repeat stat labs blood transfusion - coags/ H/H Currently has 2 PIV 18/20g, will likely need central access in ICU protonix bolus and gtt Place NGT- no known ETOH abuse NPO  Acute respiratory insufficiency, high risk for intubation At risk for aspiration -patient initially more altered on 4E and unable to clear secretions however ABG reassuring.  On arrival to ICU patient is able to cough/ clear secretions, 100% on 4L [place] P:  Monitor airway status, high risk for intubation and will likely need intubation for GI procedures  Left popliteal artery embolus s/p embolectomy 12/13 RLE hematoma w/ compartment s/p fasciotomy 12/15 P:  Holding heparin given life threatening bleeding Per vascular   DM P:  CBG q 4 SSI sensitive  Adjust Levemir to 20 units BID for now, hold /adjust if hypoglycemic    Chronic systolic HF / DCM - EF 16-96% P:  Monitor  Best practice:  Diet: NPO Pain/Anxiety/Delirium protocol (if indicated): n/a VAP protocol (if indicated): n/a DVT prophylaxis: hold for now GI prophylaxis: starting PPI gtt Glucose control: SSI/ CBG Mobility: BR Code Status: Full Family Communication: Patient    Disposition: tx to ICU  Labs   CBC: Recent Labs  Lab 09/03/18 1325  09/03/18 2258 09/04/18 0724 09/05/18 0231 09/05/18 1044 09/06/18 0233 09/07/18 0349  WBC 9.4  --  7.8 8.4 9.0  --  8.5 11.6*  NEUTROABS 6.8  --   --   --   --   --   --   --   HGB 9.8*   < > 9.1* 7.3* 7.2* 5.1* 9.5* 5.9*  HCT 31.9*   < > 28.5* 23.4* 23.1* 15.0* 29.2* 18.9*  MCV 88.9  --  88.2 89.0 89.2  --  86.9 89.2  PLT 378  --  292 285 268  --  279 312   < > = values in this interval not displayed.    Basic Metabolic Panel: Recent Labs  Lab 09/03/18 1351  09/03/18 2258 09/04/18 0724 09/05/18 0231 09/05/18 1044 09/06/18 0233 09/07/18 0349  NA 129*   < >  --  133* 131* 131* 137 136  K 2.4*   < >  --  3.1* 3.4* 3.2* 3.8 3.9  CL 81*  --   --  84* 86*  --  88* 96*  CO2  --   --   --  40* 30  --  36* 30  GLUCOSE 216*   < >  --  227* 196* 109* 242* 112*  BUN 23*  --   --  21* 16  --  8 26*  CREATININE 1.10  --   --  1.12 0.78  --  0.83 0.78  CALCIUM  --   --   --  7.9* 7.3*  --  8.1* 7.2*  MG  --   --  2.1 2.0 2.0  --  2.1 2.0   < > = values in this interval not displayed.   GFR: Estimated Creatinine Clearance: 96.1 mL/min (by C-G formula based on SCr of 0.78 mg/dL). Recent Labs  Lab 09/04/18 0724 09/05/18 0231 09/06/18 0233 09/07/18 0349  WBC 8.4 9.0 8.5 11.6*    Liver Function Tests: Recent Labs  Lab 09/04/18 0724  AST 31  ALT 16  ALKPHOS 48  BILITOT 0.5  PROT 4.9*  ALBUMIN 2.5*   No results for input(s): LIPASE, AMYLASE in the last 168 hours. No results for input(s): AMMONIA in the last 168 hours.  ABG    Component Value Date/Time   PHART 7.413 09/07/2018 1005   PCO2ART 34.1 09/07/2018 1005   PO2ART 67.9 (L) 09/07/2018 1005   HCO3 21.3 09/07/2018 1005   TCO2 36 (H) 09/03/2018 1351   ACIDBASEDEF 2.5 (H) 09/07/2018 1005   O2SAT 94.3 09/07/2018 1005     Coagulation Profile: Recent Labs  Lab 09/03/18 1325  INR 1.03    Cardiac Enzymes: Recent Labs  Lab  09/03/18 2258 09/04/18 0724 09/04/18 1025  TROPONINI <0.03 <0.03 <0.03    HbA1C: Hgb A1c MFr Bld  Date/Time Value Ref Range Status  08/23/2018 03:01 AM 18.2 (H) 4.8 - 5.6 % Final    Comment:    (NOTE) Pre diabetes:          5.7%-6.4% Diabetes:              >6.4% Glycemic control for   <7.0% adults with diabetes   01/30/2018 03:44 AM >15.5 (H) 4.8 - 5.6 % Final    Comment:    (NOTE) **Verified by repeat analysis**         Prediabetes: 5.7 - 6.4         Diabetes: >6.4         Glycemic control for adults with diabetes: <7.0     CBG: Recent Labs  Lab 09/06/18 1951 09/06/18 2048 09/07/18 0003 09/07/18 0524 09/07/18 0954  GLUCAP 131* 92 98 105* 235*    Review of Systems:   Unable   Past Medical History  He,  has a past medical history of Back pain, chronic, Diabetes ([place]), Dilated cardiomyopathy ([place]), HLD (hyperlipidemia), Hypertension, IDDM (insulin dependent diabetes mellitus) ([place]), and Ruptured lumbar disc.   Surgical History    Past Surgical History:  Procedure Laterality Date  . ABDOMINAL AORTOGRAM W/LOWER EXTREMITY Right 09/03/2018   Procedure: ABDOMINAL AORTOGRAM W/LOWER EXTREMITY;  Surgeon: Angelia Mould, MD;  Location: [facility];  Service: Cardiovascular;  Laterality: Right;  . ABDOMINAL SURGERY    . APPENDECTOMY    . APPLICATION OF WOUND VAC Right 09/05/2018   Procedure: APPLICATION OF WOUND VAC RIGHT LOWER MEDIAL AND LATERAL FASCIOTOMY SITE;  Surgeon: Angelia Mould, MD;  Location: [facility];  Service: Vascular;  Laterality: Right;  . BACK SURGERY    . CARDIAC CATHETERIZATION N/A 05/19/2016   Procedure: Left Heart Cath and Coronary Angiography;  Surgeon: Leonie Man, MD;  Location: [facility];  Service: Cardiovascular;  Laterality: N/A;  . EMBOLECTOMY Right 09/03/2018   Procedure: Embolectomy Right Popliteal and Tibial Artery, Bovine Pericardium Patch Angioplasty Right Popliteal Artery;  Surgeon: Angelia Mould, MD;  Location: [facility];  Service: Vascular;  Laterality: Right;  . FASCIOTOMY Right 09/05/2018   Procedure: FOUR COMPARTMENT FASCIOTOMY RIGHT LOWER LEG ;  Surgeon: Angelia Mould, MD;  Location: [facility];  Service: Vascular;  Laterality: Right;  . HEMATOMA EVACUATION Right 09/05/2018   Procedure: EVACUATION HEMATOMA OF RIGHT LOWER EXTREMITY; EVACUATION OF LYMPHOCELE RIGHT LOWER LEG ;  Surgeon: Angelia Mould, MD;  Location: [facility];  Service: Vascular;  Laterality: Right;     Social History   reports that he has been smoking cigarettes. He has a 6.25 pack-year smoking history. He has never used smokeless tobacco. He reports that he does not drink alcohol or use drugs.   Family History   His family history includes Diabetes in his brother, mother, and sister; Heart failure in his mother.   Allergies Allergies  Allergen Reactions  . Lisinopril Anaphylaxis     Home Medications  Prior to Admission medications   Medication Sig Start Date End Date Taking? Authorizing Provider  amLODipine (NORVASC) 10 MG tablet Take 1 tablet (10 mg total) by mouth daily. 08/25/18  Yes Ghimire, Henreitta Leber, MD  atorvastatin (LIPITOR) 20 MG tablet Take 1 tablet (20 mg total) by mouth daily. 08/25/18  Yes Ghimire, Henreitta Leber, MD  cyclobenzaprine (FLEXERIL) 10 MG tablet Take 1 tablet (10 mg total) by mouth 2 (two) times daily as needed for muscle spasms. 08/25/18  Yes Ghimire, Henreitta Leber, MD  gabapentin (NEURONTIN) 300 MG capsule Take 1 capsule (300 mg total) by mouth 3 (three) times daily. 08/25/18  Yes Ghimire, Henreitta Leber, MD  insulin aspart (NOVOLOG) 100 UNIT/ML injection Inject 0-15 Units into the skin 3 (three) times daily with meals. 0-15 Units, Subcutaneous, 3 times daily with meals CBG < 70: implement hypoglycemia protocol CBG 70 - 120: 0 units CBG 121 - 150: 2 units CBG 151 - 200: 3 units CBG 201 - 250: 5 units CBG 251 - 300: 8 units CBG 301 - 350: 11 units CBG 351 - 400: 15 units CBG > 400: call  MD 08/25/18  Yes Ghimire, Henreitta Leber, MD  insulin detemir (LEVEMIR) 100 UNIT/ML injection Inject 0.3 mLs (30 Units total) into the skin 2 (two) times daily. Patient taking differently: Inject 20-30 Units into the skin See admin instructions. Take 30 units in the morning, and 20 units in the evening 08/25/18  Yes Ghimire, Henreitta Leber, MD  metFORMIN (GLUCOPHAGE) 1000 MG tablet Take 1 tablet (1,000 mg total) by mouth 2 (two) times daily with a meal. 08/25/18  Yes Ghimire, Henreitta Leber, MD  nitroGLYCERIN (NITROSTAT) 0.4 MG SL tablet Place 1 tablet (0.4 mg total) under the tongue every 5 (five) minutes as needed for chest pain. 08/25/18  Yes Ghimire, Henreitta Leber, MD  dicyclomine (BENTYL) 20 MG tablet Take 1 tablet (20 mg total) by mouth 2 (two) times daily. Patient not taking: Reported on 09/05/2018 08/25/18   Jonetta Osgood, MD  glucose blood (AGAMATRIX PRESTO TEST) test strip Check blood sugars prior to meals and at bedtime 08/25/18   Jonetta Osgood, MD     Critical care time: 78 mins    Kennieth Rad, [place] Pgr: [phone] or if no answer [phone] 09/07/2018, 11:22 AM

## 2018-09-07 NOTE — Care Management Note (Signed)
Case Management Note Marvetta Gibbons RN, BSN Transitions of Care Unit 4E- RN Case Manager [phone]  Patient Details  Name: Ryan Medina MRN: [id number] Date of Birth: 1960/11/03  Subjective/Objective:   Pt admitted with popliteal artery embolus, s/p popliteal and tibial artery embolectomy with subsequent evacuation and 4 compartment fasciotomy with wound VAC placement on 09/05/18 - plan to return to OR later this week for possible closure              Action/Plan: PTA pt lived at home, CM to follow for possible home wound VAC needs (would need [facility] wound VAC) and potential  HH needs for wound VAC drsg changes.   Expected Discharge Date:                  Expected Discharge Plan:  [place]  In-House Referral:     Discharge planning Services  CM Consult  Post Acute Care Choice:  Durable Medical Equipment Choice offered to:  Patient  DME Arranged:  Vac DME Agency:  [facility]  HH Arranged:    [place] Agency:     Status of Service:  In process, will continue to follow  If discussed at Long Length of Stay Meetings, dates discussed:    Additional Comments:  09/07/18- 1500- Anamarie Hunn RN, CM- pt developed GIB, drop in hgb, and hemodynamic instability this AM, tx to 3MW for further medical tx- intubated per PCCM. CM will continue to follow for transition of care needs.   Dawayne Patricia, RN 09/07/2018, 3:27 PM

## 2018-09-07 NOTE — Sedation Documentation (Signed)
Vital signs stable. 

## 2018-09-07 NOTE — Progress Notes (Signed)
Patient transported back to the room from IR. No complications noted.

## 2018-09-07 NOTE — Significant Event (Signed)
Patient's sister, Regino Schultze [phone]) arrived to bedside.  I went to update sister on patient's condition.  Patient has additional brother that she is unsure if he will come to hospital as he can not drive and states that he too, has a drug habit.  She tells me that since their mother died in Jan 11, 2010, patient has been essentially homeless with a drug habit.  She is frustrated and distances herself from them due to their lifestyle.  She was unaware that patient has been hospitalized since 12/13.  She understands he is a bad diabetic and has hypertension.  We discussed his chronic systolic heart failure and what he was originally admitted for.  Additionally, I generally discussed his current situations of hemodynamic instability requiring vasopressors, hemorraghic shock from acute GI bleed 2/2 duodenal ucler and blot clot that he is going to IR for embolization for, and his severe esophagitis.  Given this and his chronic health conditions, patient is at a very high risk for further deterioration and has a long recovery ahead.  She understands we are doing everything we can, but it was also discussed that if he should deteriorate, she agrees that we should not resuscitate him and allow him a natural death.  Therefore, code status changed to reflect limited code- no CPR/ defib, will continue with current mechanical ventilation, vasopressor support, and ongoing blood resuscitation.    Kennieth Rad, AGACNP-BC [facility] Pgr: [phone] or if no answer [phone] 09/07/2018, 7:16 PM

## 2018-09-07 NOTE — Progress Notes (Signed)
Pt's BP was 64/50 while heart rate was up to the 130s and going in and out of consciousness.  Bodenheimer was notified and ordered to give a 1000 mL Bolus IV once.  Will continue to monitor pt.  Lupita Dawn, RN

## 2018-09-07 NOTE — Progress Notes (Signed)
Patient is back on the unit without any complications.

## 2018-09-07 NOTE — Brief Op Note (Addendum)
09/03/2018 - 09/07/2018  5:06 PM  PATIENT:  Ryan Medina  57 y.o. male  PRE-OPERATIVE DIAGNOSIS:  Coffee-ground emesis, melenic and bloody stool.  IV heparin on board until 8 AM 09/07/2018.  POST-OPERATIVE DIAGNOSIS:  severe esophagitis; duodenal bulb ulcer/clot  PROCEDURE:  Procedure(s): ESOPHAGOGASTRODUODENOSCOPY (EGD) (N/A)  SURGEON:  Surgeon(s) and Role:    * Mansouraty, Telford Nab., MD - Primary  ANESTHESIA:   general  BLOOD ADMINISTERED:none  DICTATION: There is an issue with probation such that he is formal note and images will not be available until potentially 12/18 thus this is a brief op note of today's endoscopy  Proximal esophagus to 20 cm no gross lesions Middle and distal esophagus to the GE junction with severe grade D esophagitis.  Mucosa is black and necrotic appearing.  Stomach evaluation difficult in setting of significant foodstuffs and clot.  Stomach has evidence of J-shaped deformity.  No evidence of significant ulcer disease noted within the stomach however the fundus was not able to be cleared completely because of clot.  Duodenum with large clot over an underlying ulcer approximately 6 cm in size.  This ulcer was approximately 60% of the duodenal bulb moving towards the D1-D2 angle.  Attempts at suctioning the large clot up were not successful in removing it completely.  However the sides of the ulcer clot were able to be removed and active oozing was noted.  Duodenum to and ampulla were normal-appearing After discussion with the ICU staff as well as with interventional radiology as the patient was having significant persistent hypotension requiring pressor support and additional blood products decision was made to move forward with hemo-spray and subsequent decision for empiric gastroduodenal artery embolization because of the location of the ulcer.  Hemo-spray was used throughout the D1-D2 angle into the bulb into the proximal stomach.  The endoscope was  removed from the patient  PLAN OF CARE: Continue ICU level of care and supportive management IV PPI drip should be continued Interventional radiology has been called and case discussed for attempt at empiric gastroduodenal artery embolization as a result of the significant ulcer burden. Would not replace NG tube at this time. Please send a H. pylori blood antigen and if positive should be started on quadruple therapy   PATIENT DISPOSITION:  Patient will remain in the ICU Reasonable to give Reglan 3 times daily versus erythromycin IV twice daily to help clear the stomach and duodenum Would not restart anticoagulation until GDA embolization and then discussion should be had as to potential timing which could be days or weeks if necessary may require a heparin drip.     Justice Britain, MD [facility] Advanced [facility]oscopy Office # [phone]

## 2018-09-07 NOTE — Progress Notes (Signed)
eLink Physician-Brief Progress Note Patient Name: Ryan Medina DOB: 12-26-1960 MRN: [id number]   Date of Service  09/07/2018  HPI/Events of Note  Pt completed 5th unit of blood.  Pt is getting ready for IR procedure.  RN called regarding low Ca 6.4, albumin also extremely low at 1.4.  eICU Interventions  Corrected Calcium is 8.5. Give 1g Calcium chloride.     Intervention Category Minor Interventions: Electrolytes abnormality - evaluation and management  Elsie Lincoln 09/07/2018, 8:34 PM

## 2018-09-07 NOTE — Sedation Documentation (Signed)
Blood infusion complete

## 2018-09-07 NOTE — Progress Notes (Signed)
CRITICAL VALUE ALERT  Critical Value:  Hgb 5.9  Date & Time Notied:  09/07/2018 at 05:00 am  Provider Notified: Silas Sacramento, NP  Orders Received/Actions taken: STAT Type and screen and give 2 Units of RBCs.

## 2018-09-07 NOTE — Progress Notes (Signed)
Patient transported to IR safely with RT and moved to table without any issues.

## 2018-09-07 NOTE — Procedures (Signed)
Intubation Procedure Note Ryan Medina [id number] [birth year]  Procedure: Intubation Indications: Airway protection and maintenance  Procedure Details Consent: Unable to obtain consent because of altered level of consciousness. Time Out: Verified patient identification, verified procedure, site/side was marked, verified correct patient position, special equipment/implants available, medications/allergies/relevent history reviewed, required imaging and test results available.  Performed  MAC  Meds administered: versed, fentanyl, etomidate, roc Tube position at 24.  Evaluation Hemodynamic Status: BP stable throughout; O2 sats: transiently fell during during procedure Patient's Current Condition: stable Complications: No apparent complications Patient did tolerate procedure well. Chest X-ray ordered to verify placement.  CXR: pending.   Alphonzo Grieve 09/07/2018

## 2018-09-07 NOTE — Progress Notes (Signed)
Transported to IR w/o complications. 100% FiO2 uneventful trip.  RRT in interventional Radiology at this time with patient, standby.

## 2018-09-07 NOTE — Sedation Documentation (Signed)
Report given to ICU RN at bedside.

## 2018-09-07 NOTE — Progress Notes (Addendum)
  Progress Note  VASCULAR SURGERY ASSESSMENT & PLAN:   UPPER GI BLEED: Results of upper endoscopy noted.  The patient was noted to have a large clot in the duodenal bulb.  He is scheduled for arteriography tonight for embolization of the gastroduodenal artery.  His vacs have a good seal.  We plan on considering closure of his fasciotomies later in the week when she is stable from his current bleeding issue.  He has a biphasic posterior tibial signal with the Doppler.  Obviously we are having to hold all anticoagulation for now.  His echo was unremarkable.  [name], MD, FACS [facility] [phone] Office: [phone]  09/07/2018 7:40 AM 2 Days Post-Op  Subjective:  Somnolent this morning   Vitals:   09/07/18 0519 09/07/18 0641  BP: (!) 64/50 (!) 86/63  Pulse:    Resp: (!) 28 (!) 31  Temp:    SpO2:     Physical Exam: Cardiac:  Tachycardic Incisions:  Minimal serosanguinous drainage in vac overnight; no impressive R calf tightness Extremities:  DP and PT signal by doppler R foot Abdomen:  Soft, non tender Neurologic: somnolent  CBC    Component Value Date/Time   WBC 11.6 (H) 09/07/2018 0349   RBC 2.12 (L) 09/07/2018 0349   HGB 5.9 (LL) 09/07/2018 0349   HCT 18.9 (L) 09/07/2018 0349   PLT 312 09/07/2018 0349   MCV 89.2 09/07/2018 0349   MCH 27.8 09/07/2018 0349   MCHC 31.2 09/07/2018 0349   RDW 13.2 09/07/2018 0349   LYMPHSABS 1.9 09/03/2018 1325   MONOABS 0.7 09/03/2018 1325   EOSABS 0.0 09/03/2018 1325   BASOSABS 0.0 09/03/2018 1325    BMET    Component Value Date/Time   NA 136 09/07/2018 0349   K 3.9 09/07/2018 0349   CL 96 (L) 09/07/2018 0349   CO2 30 09/07/2018 0349   GLUCOSE 112 (H) 09/07/2018 0349   BUN 26 (H) 09/07/2018 0349   CREATININE 0.78 09/07/2018 0349   CREATININE 0.92 05/15/2016 1038   CALCIUM 7.2 (L) 09/07/2018 0349   GFRNONAA >60 09/07/2018 0349   GFRAA >60 09/07/2018 0349    INR    Component Value Date/Time   INR 1.03  09/03/2018 1325     Intake/Output Summary (Last 24 hours) at 09/07/2018 0740 Last data filed at 09/07/2018 0636 Gross per 24 hour  Intake 4835.26 ml  Output 700 ml  Net 4135.26 ml     Assessment/Plan:  57 y.o. male is s/p popliteal and tibial artery embolectomy with subsequent evacuation and 4 compartment fasciotomy 2 Days Post-Op   Perfusing RLE well with peripheral signals by doppler No obvious bleeding from R leg; would not stop heparin unless there was an obvious source of bleeding  Wound vac change today fasciotomy incisions; potential return to OR for fasciotomy closure later in the week Transfuse 2u PRBCs per primary service   Dagoberto Ligas, PA-C [facility] [phone] 09/07/2018 7:40 AM

## 2018-09-07 NOTE — Significant Event (Signed)
Rapid Response Event Note  Overview:  Called to assist with patient with bloody stools and dark emesis Time Called: 0922 Arrival Time: 0928 Event Type: Hypotension, Other (Comment)  Initial Focused Assessment: On arrival patient turned to side - warm and moist - will open eyes to loud voice but does not follow commands - family member at bedside states he has been sleepy today - RN Brooke at bedside -reports patient had sudden incontinent  dark bloody stool followed by dark coffee ground emesis.  Bil BS coarse - upper airway congestion- suctioned oral cavity - has cough but weak.  Right leg VAC dressing intact - no bleeding noted from wound.    BP dropped to 80/systolic.  Abd round firm upper.  Patient was on Heparin - now discontinued.  CBG 235.      Interventions:  NS bolus started wide open - has blood infusing - rate increased.  BP responds to fluid - see flowsheet for details - still soft.  Stat page Dr. Reesa Chew - to bedside.  Orders noted - patient responds to pain and loud voice - moans - still no commands but moves.  Opens eyes to family voice.  Dr. Reesa Chew consulting GI and PCCM.  Continue to infuse fluids and blood - second unit of blood started.  Patient now with second large maroon stool with large clots.  Abd remains large and tight - patient a little more responsive - moving purposeful and trying to talk.  Still with weak cough - concerned with airway protection.  Kennieth Rad NP with PCCM to bedside.  Transfer to [phone] with RT.  Handoff to [employer].    Plan of Care (if not transferred):  Event Summary: Name of Physician Notified: Dr. Reesa Chew at ([facility] RRT)  Name of Consulting Physician Notified: PCCM per Dr. Reesa Chew at          [place], Ryan Medina

## 2018-09-07 NOTE — Progress Notes (Addendum)
PROGRESS NOTE    Ryan Medina  ZMO:[id number] DOB: 1961-05-19 DOA: 09/03/2018 PCP: Patient, No Pcp Per   Brief Narrative:  57 year old with history of diabetes mellitus type 2, systolic CHF, cocaine use, essential hypertension was recently admitted here for DKA but after going home he developed left lower extremity pain.  Upon admission he was seen by vascular surgeon who performed emergent embolectomy on 12/13.  He was placed on heparin drip.  Following day patient was noted to have bleeding from his right leg therefore he was taken to the OR for evacuation of the right sided hematoma and evaluation for compartment syndrome requiring fasciotomy 12/15.  Overnight patient developed hypotension with severe anemia therefore transfusion was ordered.  Following morning this worsened therefore critical care and GI were consulted and patient was transferred to the ICU.   Assessment & Plan:   Principal Problem:   Popliteal artery embolus (HCC) Active Problems:   DM (diabetes mellitus), type 2, uncontrolled ([place])   Essential hypertension   Dilated cardiomyopathy (HCC)   Chest pain   AAA (abdominal aortic aneurysm) (HCC)   Ischemic foot   Right leg claudication (HCC)   PVD (peripheral vascular disease) ([place])  Acute blood loss anemia with active GI bleeding, upper and lower  Hypovolemic shock secondary to acute bleeding Acute metabolic encephalopathy - Critical care team urgently consulted, transferring patient to the ICU - 2 units of PRBC getting transfused, nearly completed the first unit.  Getting IV fluid bolus - Stat labs have been checked, including ABG and lactic acid.  Check ammonia level - Gastroenterology has been consulted-we will see the patient.  Protonix drip started.  - Due to declining mentation and concerns of upper GI bleed and aspiration, he will be intubated. -Check coags, heparin stopped this morning. -Once hemodynamically stable, consider CT of the head and CT of the  abdomen pelvis. -If no improvement in blood pressure with fluids and blood product, will definitely require pressors and central line. -Antihypertensives on hold.  -Rest of the management per critical team  Left popliteal artery embolus status post embolectomy Right lower extremity hematoma, lymphocele requiring compartment fasciotomy 09/06/15 - Status post embolectomy on 12/13 and fasciotomy on 12/15 due to concerns for compartment syndrome. -Heparin drip and anticoagulation to be stopped due to acute blood loss anemia - Dr. Doren Custard from vascular has been notified  Atypical chest pain secondary to cocaine use; resolved -This is resolved.  Had unremarkable cardiac cath 2 years ago - Last echocardiogram ejection fraction 40-45%  Hypokalemia -Replete PRN  Essential hypertension - Hold antihypertensive in the setting of acute bleeding  Chronic systolic congestive heart failure -Echocardiogram done about 1 week ago showed ejection fraction 40-45%.  Avoid use of beta-blocker due to cocaine use  DVT prophylaxis: Hold due to acute bleeding Code Status: Full code Family Communication: None at bedside Disposition Plan: Transfer the patient to the ICU.  He is critically ill at this time.  TRH WILL SIGN OFF WHILE IN ICU, WILL RESUME CARE ONCE TRANSFERRED OUT.   Consultants:   Vascular surgery  Gastroenterology  Critical care  Procedures:   Embolectomy on 12/13  Evacuation of hematoma and fasciotomy on 09/05/18  Antimicrobials:   PeriOp   Subjective: Overnight patient was noted to be hypotensive and drop of hemoglobin to 5.9.  2 units of PRBC was ordered along with IV fluids which improved.  Heparin drip was stopped.  Suddenly again in the morning he became hypotensive and had 2 large bloody bowel  movements which were dark and later became bright red.  He was also noted to be hypertensive with systolic of 24Q.  Bolus of IV fluid was ordered.  He is nearly completed his first unit  of PRBC, getting a second unit.  Patient is also slightly obtunded this morning and his abdomen slightly appears slightly more tense than usual. Critical care team and gastroenterology were notified.  Vascular team was also notified.  While at bedside patient also developed what appeared to be hemoptysis/hematemesis.  I do not believe his mentation is stable enough to protect his airway.  He will likely need to be intubated.    Objective: Vitals:   09/07/18 0955 09/07/18 1000 09/07/18 1015 09/07/18 1040  BP: 108/69 101/69 120/75 102/77  Pulse:  (!) 117 (!) 118 (!) 123  Resp: 18 16 (!) 21 14  Temp:   (!) 97.5 F (36.4 C)   TempSrc:   Oral   SpO2:  100% 99% 100%  Weight:      Height:        Intake/Output Summary (Last 24 hours) at 09/07/2018 1047 Last data filed at 09/07/2018 1000 Gross per 24 hour  Intake 5075.26 ml  Output 700 ml  Net 4375.26 ml   Filed Weights   09/03/18 1650  Weight: 66.7 kg    Examination:  Constitutional: Currently obtunded Eyes: PERRL, lids and conjunctivae normal ENMT: Mucous membranes are dry. Posterior pharynx clear of any exudate or lesions.Normal dentition.  Slight blood noted in his mouth Neck: normal, supple, no masses, no thyromegaly Respiratory: Diminished breath sounds at the bases Cardiovascular: Sinus tachycardia, no murmurs / rubs / gallops. No extremity edema. 2+ pedal pulses. No carotid bruits.  Abdomen: Slightly tense, dark/bright red blood coming out of his rectum. Musculoskeletal: Unable to fully examine Skin: No obvious rashes seen Neurologic: Unable to fully examine Psychiatric: Unable to fully examine   Data Reviewed:   CBC: Recent Labs  Lab 09/03/18 1325  09/03/18 2258 09/04/18 0724 09/05/18 0231 09/05/18 1044 09/06/18 0233 09/07/18 0349  WBC 9.4  --  7.8 8.4 9.0  --  8.5 11.6*  NEUTROABS 6.8  --   --   --   --   --   --   --   HGB 9.8*   < > 9.1* 7.3* 7.2* 5.1* 9.5* 5.9*  HCT 31.9*   < > 28.5* 23.4* 23.1* 15.0*  29.2* 18.9*  MCV 88.9  --  88.2 89.0 89.2  --  86.9 89.2  PLT 378  --  292 285 268  --  279 312   < > = values in this interval not displayed.   Basic Metabolic Panel: Recent Labs  Lab 09/03/18 1351  09/03/18 2258 09/04/18 0724 09/05/18 0231 09/05/18 1044 09/06/18 0233 09/07/18 0349  NA 129*   < >  --  133* 131* 131* 137 136  K 2.4*   < >  --  3.1* 3.4* 3.2* 3.8 3.9  CL 81*  --   --  84* 86*  --  88* 96*  CO2  --   --   --  40* 30  --  36* 30  GLUCOSE 216*   < >  --  227* 196* 109* 242* 112*  BUN 23*  --   --  21* 16  --  8 26*  CREATININE 1.10  --   --  1.12 0.78  --  0.83 0.78  CALCIUM  --   --   --  7.9*  7.3*  --  8.1* 7.2*  MG  --   --  2.1 2.0 2.0  --  2.1 2.0   < > = values in this interval not displayed.   GFR: Estimated Creatinine Clearance: 96.1 mL/min (by C-G formula based on SCr of 0.78 mg/dL). Liver Function Tests: Recent Labs  Lab 09/04/18 0724  AST 31  ALT 16  ALKPHOS 48  BILITOT 0.5  PROT 4.9*  ALBUMIN 2.5*   No results for input(s): LIPASE, AMYLASE in the last 168 hours. No results for input(s): AMMONIA in the last 168 hours. Coagulation Profile: Recent Labs  Lab 09/03/18 1325  INR 1.03   Cardiac Enzymes: Recent Labs  Lab 09/03/18 2258 09/04/18 0724 09/04/18 1025  TROPONINI <0.03 <0.03 <0.03   BNP (last 3 results) No results for input(s): PROBNP in the last 8760 hours. HbA1C: No results for input(s): HGBA1C in the last 72 hours. CBG: Recent Labs  Lab 09/06/18 1951 09/06/18 2048 09/07/18 0003 09/07/18 0524 09/07/18 0954  GLUCAP 131* 92 98 105* 235*   Lipid Profile: No results for input(s): CHOL, HDL, LDLCALC, TRIG, CHOLHDL, LDLDIRECT in the last 72 hours. Thyroid Function Tests: No results for input(s): TSH, T4TOTAL, FREET4, T3FREE, THYROIDAB in the last 72 hours. Anemia Panel: No results for input(s): VITAMINB12, FOLATE, FERRITIN, TIBC, IRON, RETICCTPCT in the last 72 hours. Sepsis Labs: No results for input(s): PROCALCITON,  LATICACIDVEN in the last 168 hours.  No results found for this or any previous visit (from the past 240 hour(s)).       Radiology Studies: Ct Angio Chest Pe W Or Wo Contrast  Result Date: 09/06/2018 CLINICAL DATA:  Evaluate thoracic aortic aneurysm. EXAM: CT ANGIOGRAPHY CHEST WITH CONTRAST TECHNIQUE: Multidetector CT imaging of the chest was performed using the standard protocol during bolus administration of intravenous contrast. Multiplanar CT image reconstructions and MIPs were obtained to evaluate the vascular anatomy. CONTRAST:  65 mL ISOVUE-370 IOPAMIDOL (ISOVUE-370) INJECTION 76% COMPARISON:  01/30/2018; 10/14/2016; 04/25/2026 FINDINGS: Vascular Findings: No evidence of thoracic aortic aneurysm with measurements as follows. No definite evidence of thoracic aortic dissection or periaortic stranding on this nongated examination. Bovine configuration of the aortic arch. The branch vessels of the aortic arch appear widely patent throughout their imaged courses. The descending thoracic aorta is of normal caliber and widely patent without hemodynamically significant stenosis. There is a very minimal amount of atherosclerotic plaque involving the proximal aspect of the descending thoracic aorta. Cardiomegaly.  No pericardial effusion. Although this examination was not tailored for the evaluation the pulmonary arteries, there are no discrete filling defects within the central pulmonary arterial tree to suggest central pulmonary embolism. Borderline enlarged caliber the main pulmonary artery measuring 32 mm in diameter (image 67, series 6) ------------------------------------------------------------- Thoracic aortic measurements: Sinotubular junction 33 mm as measured in greatest oblique coronal dimension. Proximal ascending aorta 35 mm as measured in greatest oblique axial dimension at the level of the main pulmonary artery (image 70, series 6); and approximately 36 mm in greatest oblique short axis  coronal diameter (coronal image 48, series 9). Aortic arch aorta 28 mm as measured in greatest oblique sagittal dimension. Proximal descending thoracic aorta 30 mm as measured in greatest oblique axial dimension at the level of the main pulmonary artery. Distal descending thoracic aorta 29 mm as measured in greatest oblique axial dimension at the level of the diaphragmatic hiatus. Review of the MIP images confirms the above findings. ------------------------------------------------------------- Non-Vascular Findings: Mediastinum/Lymph Nodes: Prominent right hilar lymph nodes are not  enlarged by size criteria with index right suprahilar lymph node measuring 0.8 cm in greatest short axis diameter (image 71, series 6) and index right infrahilar lymph node measuring 0.7 cm (image 88). No definitive bulky mediastinal, hilar or axillary lymphadenopathy. Lungs/Pleura: Interval development of small bilateral pleural effusions with worsening bibasilar consolidative opacities and associated air bronchograms, right greater than left. Interval development of an approximately 1.0 x 0.5 cm subpleural consolidative opacity within the right upper lobe (image 36, series 7). Unchanged punctate (approximately 3 mm) calcified granuloma within the left lower lobe (image 75, series 3). Evaluation for additional pulmonary nodules is degraded secondary to bibasilar airspace opacities. The central pulmonary airways appear widely patent. Upper abdomen: Fluid distention of the mid and distal aspects of the esophagus with marked distension of the imaged superior aspect of the stomach. Musculoskeletal: No acute or aggressive osseous abnormalities. There is incomplete fusion involving the spinous processes of multiple thoracic vertebral bodies, unchanged. Regional soft tissues appear normal. Normal appearance of the thyroid gland. IMPRESSION: 1. No evidence of thoracic aortic aneurysm or dissection. 2. Small bilateral effusions with associated  bibasilar consolidative opacities with associated air bronchograms, atelectasis versus infiltrate. Follow-up chest radiograph in 4-6 weeks after treatment is recommended to ensure resolution. 3. Fluid distension of the mid and distal aspects of the esophagus as well as marked distension of the imaged superior aspect stomach - correlation for gastric outlet obstructive symptoms is advised. 4. Cardiomegaly with enlargement of the caliber the main pulmonary artery as could be seen in the setting of pulmonary arterial hypertension. Electronically Signed   By: Sandi Mariscal M.D.   On: 09/06/2018 09:20        Scheduled Meds: . sodium chloride   Intravenous Once  . sodium chloride   Intravenous Once  . atorvastatin  20 mg Oral Daily  . insulin aspart  0-9 Units Subcutaneous TID WC  . insulin detemir  20 Units Subcutaneous QHS  . insulin detemir  30 Units Subcutaneous Daily   Continuous Infusions: . sodium chloride    . sodium chloride 100 mL/hr at 09/07/18 0637  . magnesium sulfate 1 - 4 g bolus IVPB    . pantoprazole (PROTONIX) IVPB    . pantoprozole (PROTONIX) infusion    . phenylephrine (NEO-SYNEPHRINE) Adult infusion       LOS: 4 days       Bernadette Gores Arsenio Loader, MD [facility] Pager [phone]   If 7PM-7AM, please contact night-coverage [email or web address] Password [facility] 09/07/2018, 10:47 AM

## 2018-09-07 NOTE — Sedation Documentation (Addendum)
ICU RN and RT present for case. Pt on numerous gtt's, titrating. Pt vitals stable at this time

## 2018-09-07 NOTE — Progress Notes (Signed)
Patient ID: Ryan Medina, male   DOB: Mar 10, 1961, 57 y.o.   MRN: [id number]   Risks and benefits of mesenteric arteriogram with possible embolization were discussed with the patient's sister Thurnell Garbe via phone  including, but not limited to bleeding, infection, vascular injury or contrast induced renal failure.  This interventional procedure involves the use of X-rays and because of the nature of the planned procedure, it is possible that we will have prolonged use of X-ray fluoroscopy.  Potential radiation risks to you include (but are not limited to) the following: - A slightly elevated risk for cancer  several years later in life. This risk is typically less than 0.5% percent. This risk is low in comparison to the normal incidence of human cancer, which is 33% for women and 50% for men according to the [employer]. - Radiation induced injury can include skin redness, resembling a rash, tissue breakdown / ulcers and hair loss (which can be temporary or permanent).   The likelihood of either of these occurring depends on the difficulty of the procedure and whether you are sensitive to radiation due to previous procedures, disease, or genetic conditions.   IF your procedure requires a prolonged use of radiation, you will be notified and given written instructions for further action.  It is your responsibility to monitor the irradiated area for the 2 weeks following the procedure and to notify your physician if you are concerned that you have suffered a radiation induced injury.    All of her  questions were answered, she is agreeable to proceed.  Consent signed and in chart.

## 2018-09-07 NOTE — Progress Notes (Signed)
CRITICAL VALUE ALERT  Critical Value: Calcium   Date & Time Notied:  09/07/18 19:30  Provider Notified: Warren Lacy MD   Orders Received/Actions taken: Waiting for orders

## 2018-09-07 NOTE — Procedures (Signed)
Central Venous Catheter Insertion Procedure Note Ryan Medina [id number] Sep 02, 1961  Procedure: Insertion of Central Venous Catheter Indications: Drug and/or fluid administration  Procedure Details Consent: Unable to obtain consent because of altered level of consciousness. Time Out: Verified patient identification, verified procedure, site/side was marked, verified correct patient position, special equipment/implants available, medications/allergies/relevent history reviewed, required imaging and test results available.  Performed  Maximum sterile technique was used including antiseptics, cap, gloves, gown, hand hygiene, mask and sheet. Skin prep: Chlorhexidine; local anesthetic administered A antimicrobial bonded/coated triple lumen catheter was placed in the left internal jugular vein using the Seldinger technique.  Evaluation Blood flow good Complications: No apparent complications Patient did tolerate procedure well. Chest X-ray ordered to verify placement.  CXR: pending.  Alphonzo Grieve 09/07/2018, 12:41 PM

## 2018-09-07 NOTE — Sedation Documentation (Signed)
Patient is resting comfortably. Vitals stable. 

## 2018-09-07 NOTE — Consult Note (Addendum)
[facility] Consult: 10:26 AM 09/07/2018  LOS: 4 days    Referring Provider: Dr. Reesa Chew Primary Care Physician:  Patient, No Pcp Per Primary Gastroenterologist: None, unassigned.   Reason for Consultation: GI bleed   HPI: Ryan Medina is a 57 y.o. male.  PMH IDDM.  Dilated cardiomyopathy.  Spinal disc disease,  back surgery chronic back pain.  Hypertension.  Hyperlipidemia.  Previous appendectomy.  Story of tobacco, cocaine abuse.  3 sonometer AAA.  Claudication.  Patient admitted 4 days ago complaining of abdominal pain, nausea vomiting, generalized weakness for 2 to 3 days.  Pain and numbness in the right foot began the evening prior to arrival.  Nonbloody emesis.  No melena or bleeding per rectum, no diarrhea.  Abdominal pain in the left abdomen, 9/10 in severity initially. 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 Underwent 12/13 popliteal and tibial artery embolectomy and subsequent 4 compartment fasciotomy, evacuation of hematoma on 12/15. Anemia has been an issue.  Baseline Hgb looks to be about 14.5.  It was 11.4 at admission.  Dropped as low as 5.1 two days ago.  2 units of blood, Hgb last night 9.5. Early this morning he developed tachycardia and hypotension, lethargy, altered consciousness.  Passed large, bloody stool around 8 AM this morning.  Hgb dropped to 5.9 this morning. IV heparin discontinued after this bloody stool. At least 3 episodes bloody stool ranging from brighter red to [employer].  Smells melenic.  Has had episodes of coffee-ground emesis.  NG tube was just placed and very rapidly 1500 cc of burgundy material has been suctioned.  In addition to the GI bleeding and hemodynamic instability, the patient has become confused.  Since girlfriend, partner  says the patient's been planing of heartburn and vomiting clear, bilious material for months and has not been eating well.  He is lost weight.  He complains of abdominal pain.  Does not use aspirin powders or NSAIDs.  Has not tried taking PPI or H2 blockers but has been using baking soda for his vomiting and heartburn.  He does not drink alcohol.  09/06/2018 CTA chest revealed bilateral pleural effusions.  NO PE.  Fluid distention in the mid and distal esophagus, marketed distention in the superior stomach, correlate for GOO.    Past Medical History:  Diagnosis Date  . Back pain, chronic   . Diabetes ([place])   . Dilated cardiomyopathy ([place])    Echo 2/18: EF 45-50, diff HK, Gr 1 DD, trivial AI/TR  . HLD (hyperlipidemia)   . Hypertension   . IDDM (insulin dependent diabetes mellitus) ([place])   . Ruptured lumbar disc     Past Surgical History:  Procedure Laterality Date  . ABDOMINAL AORTOGRAM W/LOWER EXTREMITY Right 09/03/2018   Procedure: ABDOMINAL AORTOGRAM W/LOWER EXTREMITY;  Surgeon: Angelia Mould, MD;  Location: [facility];  Service: Cardiovascular;  Laterality: Right;  . ABDOMINAL SURGERY    . APPENDECTOMY    . APPLICATION OF WOUND VAC Right 09/05/2018   Procedure: APPLICATION OF WOUND VAC RIGHT  LOWER MEDIAL AND LATERAL FASCIOTOMY SITE;  Surgeon: Angelia Mould, MD;  Location: [facility] [place];  Service: Vascular;  Laterality: Right;  . BACK SURGERY    . CARDIAC CATHETERIZATION N/A 05/19/2016   Procedure: Left Heart Cath and Coronary Angiography;  Surgeon: Leonie Man, MD;  Location: [facility];  Service: Cardiovascular;  Laterality: N/A;  . EMBOLECTOMY Right 09/03/2018   Procedure: Embolectomy Right Popliteal and Tibial Artery, Bovine Pericardium Patch Angioplasty Right Popliteal Artery;  Surgeon: Angelia Mould, MD;  Location: [place];  Service: Vascular;  Laterality: Right;  . FASCIOTOMY Right 09/05/2018   Procedure: FOUR COMPARTMENT FASCIOTOMY RIGHT  LOWER LEG ;  Surgeon: Angelia Mould, MD;  Location: [place];  Service: Vascular;  Laterality: Right;  . HEMATOMA EVACUATION Right 09/05/2018   Procedure: EVACUATION HEMATOMA OF RIGHT LOWER EXTREMITY; EVACUATION OF LYMPHOCELE RIGHT LOWER LEG ;  Surgeon: Angelia Mould, MD;  Location: [place];  Service: Vascular;  Laterality: Right;    Prior to Admission medications   Medication Sig Start Date End Date Taking? Authorizing Provider  amLODipine (NORVASC) 10 MG tablet Take 1 tablet (10 mg total) by mouth daily. 08/25/18  Yes Ghimire, Henreitta Leber, MD  atorvastatin (LIPITOR) 20 MG tablet Take 1 tablet (20 mg total) by mouth daily. 08/25/18  Yes Ghimire, Henreitta Leber, MD  cyclobenzaprine (FLEXERIL) 10 MG tablet Take 1 tablet (10 mg total) by mouth 2 (two) times daily as needed for muscle spasms. 08/25/18  Yes Ghimire, Henreitta Leber, MD  gabapentin (NEURONTIN) 300 MG capsule Take 1 capsule (300 mg total) by mouth 3 (three) times daily. 08/25/18  Yes Ghimire, Henreitta Leber, MD  insulin aspart (NOVOLOG) 100 UNIT/ML injection Inject 0-15 Units into the skin 3 (three) times daily with meals. 0-15 Units, Subcutaneous, 3 times daily with meals CBG < 70: implement hypoglycemia protocol CBG 70 - 120: 0 units CBG 121 - 150: 2 units CBG 151 - 200: 3 units CBG 201 - 250: 5 units CBG 251 - 300: 8 units CBG 301 - 350: 11 units CBG 351 - 400: 15 units CBG > 400: call MD 08/25/18  Yes Ghimire, Henreitta Leber, MD  insulin detemir (LEVEMIR) 100 UNIT/ML injection Inject 0.3 mLs (30 Units total) into the skin 2 (two) times daily. Patient taking differently: Inject 20-30 Units into the skin See admin instructions. Take 30 units in the morning, and 20 units in the evening 08/25/18  Yes Ghimire, Henreitta Leber, MD  metFORMIN (GLUCOPHAGE) 1000 MG tablet Take 1 tablet (1,000 mg total) by mouth 2 (two) times daily with a meal. 08/25/18  Yes Ghimire, Henreitta Leber, MD  nitroGLYCERIN (NITROSTAT) 0.4 MG SL tablet Place 1 tablet (0.4 mg total) under  the tongue every 5 (five) minutes as needed for chest pain. 08/25/18  Yes Ghimire, Henreitta Leber, MD  dicyclomine (BENTYL) 20 MG tablet Take 1 tablet (20 mg total) by mouth 2 (two) times daily. Patient not taking: Reported on 09/05/2018 08/25/18   Jonetta Osgood, MD  glucose blood (AGAMATRIX PRESTO TEST) test strip Check blood sugars prior to meals and at bedtime 08/25/18   Ghimire, Henreitta Leber, MD    Scheduled Meds: . sodium chloride   Intravenous Once  . sodium chloride   Intravenous Once  . atorvastatin  20 mg Oral Daily  . docusate sodium  100 mg Oral Daily  . gabapentin  300 mg Oral BID  . insulin aspart  0-9 Units Subcutaneous TID WC  . insulin detemir  20  Units Subcutaneous QHS  . insulin detemir  30 Units Subcutaneous Daily  . [START ON 09/10/2018] pantoprazole  40 mg Intravenous Q12H   Infusions: . sodium chloride    . sodium chloride 100 mL/hr at 09/07/18 0637  . magnesium sulfate 1 - 4 g bolus IVPB    . pantoprazole (PROTONIX) IVPB    . pantoprozole (PROTONIX) infusion    . phenylephrine (NEO-SYNEPHRINE) Adult infusion     PRN Meds: sodium chloride, acetaminophen **OR** acetaminophen, alum & mag hydroxide-simeth, guaiFENesin-dextromethorphan, hydrALAZINE, labetalol, magnesium sulfate 1 - 4 g bolus IVPB, metoprolol tartrate, morphine injection, nitroGLYCERIN, ondansetron, ondansetron **OR** ondansetron (ZOFRAN) IV, oxyCODONE-acetaminophen, phenol, polyethylene glycol, potassium chloride, senna-docusate   Allergies as of 09/03/2018 - Review Complete 09/03/2018  Allergen Reaction Noted  . Lisinopril Anaphylaxis 12/14/2013    Family History  Problem Relation Age of Onset  . Diabetes Mother   . Heart failure Mother   . Diabetes Sister   . Diabetes Brother     Social History   Socioeconomic History  . Marital status: Single    Spouse name: Not on file  . Number of children: Not on file  . Years of education: Not on file  . Highest education level: Not on file    Occupational History  . Not on file  Social Needs  . Financial resource strain: Not on file  . Food insecurity:    Worry: Not on file    Inability: Not on file  . Transportation needs:    Medical: Not on file    Non-medical: Not on file  Tobacco Use  . Smoking status: Current Every Day Smoker    Packs/day: 0.25    Years: 25.00    Pack years: 6.25    Types: Cigarettes  . Smokeless tobacco: Never Used  . Tobacco comment: 2 per day   Substance and Sexual Activity  . Alcohol use: No    Frequency: Never  . Drug use: No  . Sexual activity: Yes  Lifestyle  . Physical activity:    Days per week: Not on file    Minutes per session: Not on file  . Stress: Not on file  Relationships  . Social connections:    Talks on phone: Not on file    Gets together: Not on file    Attends religious service: Not on file    Active member of club or organization: Not on file    Attends meetings of clubs or organizations: Not on file    Relationship status: Not on file  . Intimate partner violence:    Fear of current or ex partner: Not on file    Emotionally abused: Not on file    Physically abused: Not on file    Forced sexual activity: Not on file  Other Topics Concern  . Not on file  Social History Narrative  . Not on file    REVIEW OF SYSTEMS: Constitutional: Weakness. ENT:  No nose bleeds Pulm: Unable to gain much information but there is no reports of shortness of breath or cough. CV:  No palpitations, no LE edema.  GU:  No hematuria, no frequency GI: See HPI. Heme: See HPI. Transfusions: See HPI. Neuro:  No headaches, no peripheral tingling or numbness Derm:  No itching, no rash or sores.  Endocrine:  No sweats or chills.  No polyuria or dysuria Immunization: Not known. Travel: Not known.   PHYSICAL EXAM: Vital signs in last 24 hours: Vitals:   09/07/18 1000 09/07/18 1015  BP: 101/69 120/75  Pulse: (!) 117 (!) 118  Resp: 16 (!) 21  Temp:  (!) 97.5 F (36.4 C)   SpO2: 100% 99%   Wt Readings from Last 3 Encounters:  09/03/18 66.7 kg  08/24/18 66.7 kg  08/15/18 66.7 kg    General: Critically ill looking man who is in and out of consciousness.. Head: No facial asymmetry or swelling.  No signs of head trauma. Eyes: No scleral icterus.  No conjunctival pallor.  EOMI Ears: Hearing intact Nose: No discharge. NG tube in place and suctioning burgundy material Mouth: No oral bleeding Neck: No JVD, no masses, no thyromegaly Lungs: Clear in front.  No labored breathing.  No cough. Heart: Sinus tachycardia at 115 on the monitor Abdomen: Somewhat distended, moderately tense, softer after NG tube and suctioning initiated.  No tenderness.  Bowel sounds absent.  No tinkling or tympanitic bowel sounds..   Rectal: No DRE.  There is burgundy pasty/liquid stool staining the bedclothes. Musc/Skeltl: Drains with wound VAC on the right lower extremity. Extremities: No swelling in the feet. Neurologic: Follows commands.  Not oriented to place or time. Skin: No rashes, no sores. Tattoos: None observed Nodes: No cervical adenopathy Psych: Calm, cooperative.  Not able to speak much.  Intake/Output from previous day: 12/16 0701 - 12/17 0700 In: 4835.3 [P.O.:580; I.V.:3255.3; IV Piggyback:1000] Out: 700 [Urine:475; Drains:225] Intake/Output this shift: Total I/O In: 240 [Blood:240] Out: -   LAB RESULTS: Recent Labs    09/05/18 0231 09/05/18 1044 09/06/18 0233 09/07/18 0349  WBC 9.0  --  8.5 11.6*  HGB 7.2* 5.1* 9.5* 5.9*  HCT 23.1* 15.0* 29.2* 18.9*  PLT 268  --  279 312   BMET Lab Results  Component Value Date   NA 136 09/07/2018   NA 137 09/06/2018   NA 131 (L) 09/05/2018   K 3.9 09/07/2018   K 3.8 09/06/2018   K 3.2 (L) 09/05/2018   CL 96 (L) 09/07/2018   CL 88 (L) 09/06/2018   CL 86 (L) 09/05/2018   CO2 30 09/07/2018   CO2 36 (H) 09/06/2018   CO2 30 09/05/2018   GLUCOSE 112 (H) 09/07/2018   GLUCOSE 242 (H) 09/06/2018   GLUCOSE 109  (H) 09/05/2018   BUN 26 (H) 09/07/2018   BUN 8 09/06/2018   BUN 16 09/05/2018   CREATININE 0.78 09/07/2018   CREATININE 0.83 09/06/2018   CREATININE 0.78 09/05/2018   CALCIUM 7.2 (L) 09/07/2018   CALCIUM 8.1 (L) 09/06/2018   CALCIUM 7.3 (L) 09/05/2018   LFT No results for input(s): PROT, ALBUMIN, AST, ALT, ALKPHOS, BILITOT, BILIDIR, IBILI in the last 72 hours. PT/INR Lab Results  Component Value Date   INR 1.03 09/03/2018   INR 1.0 05/15/2016   INR 1.04 03/22/2016   Hepatitis Panel No results for input(s): HEPBSAG, HCVAB, HEPAIGM, HEPBIGM in the last 72 hours. C-Diff No components found for: CDIFF Lipase     Component Value Date/Time   LIPASE 42 08/22/2018 1932    Drugs of Abuse     Component Value Date/Time   LABOPIA NONE DETECTED 08/22/2018 2202   COCAINSCRNUR POSITIVE (A) 08/22/2018 2202   LABBENZ NONE DETECTED 08/22/2018 2202   AMPHETMU NONE DETECTED 08/22/2018 2202   THCU NONE DETECTED 08/22/2018 2202   LABBARB NONE DETECTED 08/22/2018 2202     RADIOLOGY STUDIES: Ct Angio Chest Pe W Or Wo Contrast  Result Date: 09/06/2018 CLINICAL DATA:  Evaluate thoracic aortic aneurysm. EXAM: CT ANGIOGRAPHY CHEST WITH CONTRAST TECHNIQUE: Multidetector  CT imaging of the chest was performed using the standard protocol during bolus administration of intravenous contrast. Multiplanar CT image reconstructions and MIPs were obtained to evaluate the vascular anatomy. CONTRAST:  65 mL ISOVUE-370 IOPAMIDOL (ISOVUE-370) INJECTION 76% COMPARISON:  01/30/2018; 10/14/2016; 04/25/2026 FINDINGS: Vascular Findings: No evidence of thoracic aortic aneurysm with measurements as follows. No definite evidence of thoracic aortic dissection or periaortic stranding on this nongated examination. Bovine configuration of the aortic arch. The branch vessels of the aortic arch appear widely patent throughout their imaged courses. The descending thoracic aorta is of normal caliber and widely patent without  hemodynamically significant stenosis. There is a very minimal amount of atherosclerotic plaque involving the proximal aspect of the descending thoracic aorta. Cardiomegaly.  No pericardial effusion. Although this examination was not tailored for the evaluation the pulmonary arteries, there are no discrete filling defects within the central pulmonary arterial tree to suggest central pulmonary embolism. Borderline enlarged caliber the main pulmonary artery measuring 32 mm in diameter (image 67, series 6) ------------------------------------------------------------- Thoracic aortic measurements: Sinotubular junction 33 mm as measured in greatest oblique coronal dimension. Proximal ascending aorta 35 mm as measured in greatest oblique axial dimension at the level of the main pulmonary artery (image 70, series 6); and approximately 36 mm in greatest oblique short axis coronal diameter (coronal image 48, series 9). Aortic arch aorta 28 mm as measured in greatest oblique sagittal dimension. Proximal descending thoracic aorta 30 mm as measured in greatest oblique axial dimension at the level of the main pulmonary artery. Distal descending thoracic aorta 29 mm as measured in greatest oblique axial dimension at the level of the diaphragmatic hiatus. Review of the MIP images confirms the above findings. ------------------------------------------------------------- Non-Vascular Findings: Mediastinum/Lymph Nodes: Prominent right hilar lymph nodes are not enlarged by size criteria with index right suprahilar lymph node measuring 0.8 cm in greatest short axis diameter (image 71, series 6) and index right infrahilar lymph node measuring 0.7 cm (image 88). No definitive bulky mediastinal, hilar or axillary lymphadenopathy. Lungs/Pleura: Interval development of small bilateral pleural effusions with worsening bibasilar consolidative opacities and associated air bronchograms, right greater than left. Interval development of an  approximately 1.0 x 0.5 cm subpleural consolidative opacity within the right upper lobe (image 36, series 7). Unchanged punctate (approximately 3 mm) calcified granuloma within the left lower lobe (image 75, series 3). Evaluation for additional pulmonary nodules is degraded secondary to bibasilar airspace opacities. The central pulmonary airways appear widely patent. Upper abdomen: Fluid distention of the mid and distal aspects of the esophagus with marked distension of the imaged superior aspect of the stomach. Musculoskeletal: No acute or aggressive osseous abnormalities. There is incomplete fusion involving the spinous processes of multiple thoracic vertebral bodies, unchanged. Regional soft tissues appear normal. Normal appearance of the thyroid gland. IMPRESSION: 1. No evidence of thoracic aortic aneurysm or dissection. 2. Small bilateral effusions with associated bibasilar consolidative opacities with associated air bronchograms, atelectasis versus infiltrate. Follow-up chest radiograph in 4-6 weeks after treatment is recommended to ensure resolution. 3. Fluid distension of the mid and distal aspects of the esophagus as well as marked distension of the imaged superior aspect stomach - correlation for gastric outlet obstructive symptoms is advised. 4. Cardiomegaly with enlargement of the caliber the main pulmonary artery as could be seen in the setting of pulmonary arterial hypertension. Electronically Signed   By: Sandi Mariscal M.D.   On: 09/06/2018 09:20    IMPRESSION:   *    Upper GI bleed with  hematemesis and melena.  This is associated with hemodynamic instability and altered mental status.  *    Blood loss anemia owing to GI bleed as well as recent embolectomy and hematoma evacuation from right lower extremity. IV heparin postop is continued around 8 AM this morning. S/p 3 U PRBCs thus far.  On this morning around 10 AM and 2 more ordered.    PLAN:     IV Protonix drip is been initiated.  I  ordered 20 mg Reglan IV now.  Ordered erythromycin 250 mg IV at 1 PM.  *     EGD set for about 2 PM today.  I left messages with a sister and brother whose numbers are the only contact numbers listed in the epic facesheet. If necessary we can go ahead and pursue the EGD without formal consent as this is an urgent, approaching emergency situation.  *    CCM will be intubating pt shortly.     Azucena Freed  09/07/2018, 10:26 AM Phone [phone]

## 2018-09-08 ENCOUNTER — Encounter (HOSPITAL_COMMUNITY): Payer: Self-pay | Admitting: Gastroenterology

## 2018-09-08 LAB — CBC WITH DIFFERENTIAL/PLATELET
Abs Immature Granulocytes: 0.05 10*3/uL (ref 0.00–0.07)
Abs Immature Granulocytes: 0 10*3/uL (ref 0.00–0.07)
Band Neutrophils: 1 %
Basophils Absolute: 0 10*3/uL (ref 0.0–0.1)
Basophils Absolute: 0.1 10*3/uL (ref 0.0–0.1)
Basophils Relative: 0 %
Basophils Relative: 1 %
Eosinophils Absolute: 0 10*3/uL (ref 0.0–0.5)
Eosinophils Absolute: 0 10*3/uL (ref 0.0–0.5)
Eosinophils Relative: 0 %
Eosinophils Relative: 0 %
HCT: 25.9 % — ABNORMAL LOW (ref 39.0–52.0)
HCT: 24.5 % — ABNORMAL LOW (ref 39.0–52.0)
Hemoglobin: 8.7 g/dL — ABNORMAL LOW (ref 13.0–17.0)
Hemoglobin: 7.9 g/dL — ABNORMAL LOW (ref 13.0–17.0)
IMMATURE GRANULOCYTES: 0 %
LYMPHS ABS: 0.6 10*3/uL — AB (ref 0.7–4.0)
Lymphocytes Relative: 8 %
Lymphocytes Relative: 5 %
Lymphs Abs: 0.9 10*3/uL (ref 0.7–4.0)
MCH: 28.8 pg (ref 26.0–34.0)
MCH: 27.5 pg (ref 26.0–34.0)
MCHC: 33.6 g/dL (ref 30.0–36.0)
MCHC: 32.2 g/dL (ref 30.0–36.0)
MCV: 85.8 fL (ref 80.0–100.0)
MCV: 85.4 fL (ref 80.0–100.0)
MONO ABS: 0.8 10*3/uL (ref 0.1–1.0)
Monocytes Absolute: 0.4 10*3/uL (ref 0.1–1.0)
Monocytes Relative: 7 %
Monocytes Relative: 3 %
NEUTROS ABS: 9.5 10*3/uL — AB (ref 1.7–7.7)
Neutro Abs: 11.2 10*3/uL — ABNORMAL HIGH (ref 1.7–7.7)
Neutrophils Relative %: 85 %
Neutrophils Relative %: 90 %
Platelets: 231 10*3/uL (ref 150–400)
Platelets: 165 10*3/uL (ref 150–400)
RBC: 3.02 MIL/uL — ABNORMAL LOW (ref 4.22–5.81)
RBC: 2.87 MIL/uL — ABNORMAL LOW (ref 4.22–5.81)
RDW: 15.3 % (ref 11.5–15.5)
RDW: 15.3 % (ref 11.5–15.5)
WBC Morphology: INCREASED
WBC: 11.3 10*3/uL — ABNORMAL HIGH (ref 4.0–10.5)
WBC: 12.3 10*3/uL — ABNORMAL HIGH (ref 4.0–10.5)
nRBC: 0 % (ref 0.0–0.2)
nRBC: 0 % (ref 0.0–0.2)
nRBC: 0 /100 WBC

## 2018-09-08 LAB — GLUCOSE, CAPILLARY
GLUCOSE-CAPILLARY: 118 mg/dL — AB (ref 70–99)
GLUCOSE-CAPILLARY: 55 mg/dL — AB (ref 70–99)
Glucose-Capillary: 102 mg/dL — ABNORMAL HIGH (ref 70–99)
Glucose-Capillary: 106 mg/dL — ABNORMAL HIGH (ref 70–99)
Glucose-Capillary: 121 mg/dL — ABNORMAL HIGH (ref 70–99)
Glucose-Capillary: 41 mg/dL — CL (ref 70–99)
Glucose-Capillary: 45 mg/dL — ABNORMAL LOW (ref 70–99)
Glucose-Capillary: 46 mg/dL — ABNORMAL LOW (ref 70–99)
Glucose-Capillary: 61 mg/dL — ABNORMAL LOW (ref 70–99)
Glucose-Capillary: 61 mg/dL — ABNORMAL LOW (ref 70–99)
Glucose-Capillary: 64 mg/dL — ABNORMAL LOW (ref 70–99)
Glucose-Capillary: 69 mg/dL — ABNORMAL LOW (ref 70–99)
Glucose-Capillary: 70 mg/dL (ref 70–99)
Glucose-Capillary: 89 mg/dL (ref 70–99)

## 2018-09-08 LAB — RENAL FUNCTION PANEL
Albumin: 1.5 g/dL — ABNORMAL LOW (ref 3.5–5.0)
Anion gap: 6 (ref 5–15)
BUN: 32 mg/dL — AB (ref 6–20)
CO2: 25 mmol/L (ref 22–32)
Calcium: 7.3 mg/dL — ABNORMAL LOW (ref 8.9–10.3)
Chloride: 110 mmol/L (ref 98–111)
Creatinine, Ser: 0.69 mg/dL (ref 0.61–1.24)
GFR calc Af Amer: >60 mL/min (ref >60)
GFR calc non Af Amer: >60 mL/min (ref >60)
Glucose, Bld: 94 mg/dL (ref 70–99)
Phosphorus: 2.8 mg/dL (ref 2.5–4.6)
Potassium: 4 mmol/L (ref 3.5–5.1)
Sodium: 141 mmol/L (ref 135–145)

## 2018-09-08 LAB — BASIC METABOLIC PANEL
Anion gap: 7 (ref 5–15)
BUN: 31 mg/dL — AB (ref 6–20)
CO2: 25 mmol/L (ref 22–32)
Calcium: 7.2 mg/dL — ABNORMAL LOW (ref 8.9–10.3)
Chloride: 108 mmol/L (ref 98–111)
Creatinine, Ser: 0.75 mg/dL (ref 0.61–1.24)
GFR calc Af Amer: >60 mL/min (ref >60)
GFR calc non Af Amer: >60 mL/min (ref >60)
Glucose, Bld: 94 mg/dL (ref 70–99)
Potassium: 3.9 mmol/L (ref 3.5–5.1)
Sodium: 140 mmol/L (ref 135–145)

## 2018-09-08 LAB — BPAM PLATELET PHERESIS
Blood Product Expiration Date: 201912182359
ISSUE DATE / TIME: 201912171605
Unit Type and Rh: 6200

## 2018-09-08 LAB — PREPARE PLATELET PHERESIS: UNIT DIVISION: 0

## 2018-09-08 LAB — MAGNESIUM: Magnesium: 2.1 mg/dL (ref 1.7–2.4)

## 2018-09-08 MED ORDER — ORAL CARE MOUTH RINSE
15.0000 mL | Freq: Two times a day (BID) | OROMUCOSAL | Status: DC
  Administered 2018-09-09 – 2018-10-18 (×44): 15 mL via OROMUCOSAL

## 2018-09-08 MED ORDER — FENTANYL CITRATE (PF) 100 MCG/2ML IJ SOLN
25.0000 ug | Freq: Once | INTRAMUSCULAR | Status: AC
  Administered 2018-09-08: 25 ug via INTRAVENOUS
  Filled 2018-09-08: qty 2

## 2018-09-08 MED ORDER — DEXTROSE 50 % IV SOLN
INTRAVENOUS | Status: AC
  Administered 2018-09-08: 25 mL
  Filled 2018-09-08: qty 50

## 2018-09-08 MED ORDER — DEXTROSE 50 % IV SOLN
INTRAVENOUS | Status: AC
  Administered 2018-09-08: 50 mL
  Filled 2018-09-08: qty 50

## 2018-09-08 MED ORDER — DEXTROSE 50 % IV SOLN
12.5000 g | Freq: Once | INTRAVENOUS | Status: AC
  Administered 2018-09-08: 12.5 g via INTRAVENOUS
  Filled 2018-09-08: qty 50

## 2018-09-08 MED ORDER — FENTANYL PEDIATRIC BOLUS VIA INFUSION: 25.0000 ug | Freq: Once | INTRAVENOUS | Status: DC

## 2018-09-08 MED ORDER — LORAZEPAM 0.5 MG PO TABS
0.5000 mg | ORAL_TABLET | Freq: Four times a day (QID) | ORAL | Status: DC | PRN
  Administered 2018-09-11 – 2018-09-12 (×4): 1 mg via ORAL
  Administered 2018-09-13: 0.5 mg via ORAL
  Administered 2018-09-15 – 2018-09-22 (×4): 1 mg via ORAL
  Administered 2018-09-24 – 2018-09-25 (×3): 0.5 mg via ORAL
  Administered 2018-09-25 – 2018-10-08 (×4): 1 mg via ORAL
  Filled 2018-09-08: qty 2
  Filled 2018-09-08: qty 1
  Filled 2018-09-08 (×2): qty 2
  Filled 2018-09-08: qty 1
  Filled 2018-09-08 (×6): qty 2
  Filled 2018-09-08: qty 1
  Filled 2018-09-08 (×4): qty 2
  Filled 2018-09-08: qty 1

## 2018-09-08 MED ORDER — DEXTROSE-NACL 5-0.9 % IV SOLN
INTRAVENOUS | Status: DC
  Administered 2018-09-08 – 2018-09-11 (×6): via INTRAVENOUS

## 2018-09-08 NOTE — Plan of Care (Signed)
  Problem: Education: Goal: Knowledge of General Education information will improve Description Including pain rating scale, medication(s)/side effects and non-pharmacologic comfort measures Outcome: Progressing   Problem: Clinical Measurements: Goal: Will remain free from infection Outcome: Progressing   Problem: Activity: Goal: Risk for activity intolerance will decrease Outcome: Progressing   Problem: Safety: Goal: Ability to remain free from injury will improve Outcome: Progressing   Problem: Skin Integrity: Goal: Risk for impaired skin integrity will decrease Outcome: Progressing   Problem: Tissue Perfusion: Goal: Adequacy of tissue perfusion will improve Outcome: Progressing

## 2018-09-08 NOTE — Progress Notes (Addendum)
NAME:  Ryan Medina, MRN:  [id number], DOB:  Jul 20, 1961, LOS: 5 ADMISSION DATE:  09/03/2018, CONSULTATION DATE:  09/07/2018 REFERRING MD:  Dr. Reesa Chew, CHIEF COMPLAINT:  GIB/ hypotension   Brief History   49 yoM admitted with LLE pain found to have ischemic right leg and acute popliteal and tibial embolus s/p embolectomy 12/13.  On heparin gtt.  Developed acute RLL hematoma with compartment syndrome requiring fasciotomy 12/15.  Decompensated 12/17 am with hypotension with abd distention and 2 massive bloody stools.  Hgb drop 9.5 -> 5.9.  Getting 2 units PRBC, protonix, and GI consulted.  Moved to ICU for hemodynamic instability.  Had embolization of GDA 09/07/2018  History of present illness   57 year old male with PMH Of DMT2, systolic HF EF ~86%, HTN, cocaine abuse who was recently hospitalized with DKA returns on 12/13 with left lower extremity pain found to have acute popliteal and tibial embolus with ischemic leg.  Underwent emergent embolectomy on 12/13 and placed on heparin.  On 12/15, he was noted to have bleeding from his right leg found to have development of large hematoma with compartment syndrome and taken emergently back to OR for fasciotomy with wound vac x 2 left in place.  Overnight, patient not to be more lethargic and hypotension.  Found to have acute drop in Hgb from 9.5 to 5.9.  Heparin gtt stopped this morning, given NS bolus and transfused with 1 unit PRBC.  Patient then had 2 large bloody bowel movements, noted to have tender distended abd, and spitting up coffee ground secretions.  GI consulted.  PCCM consulted for acute hemodynamic instability.   Past Medical History  DMT2, systolic HF EF ~76%, DCM, HTN, cocaine abuse   Significant Hospital Events   12/13 Admitted   Consults:  Vascular GI PCCM  Procedures:  12/13 LLE embolectomy  12/15 RLE fasciotomy  09/07/2018 intubation>> 09/07/2018 interventional radiology perform mesenteric angiography and cold embolization of  the GDA  Significant Diagnostic Tests:  12/14 TTE >> - Left ventricle: Wall thickness was increased in a pattern of mild   LVH. Systolic function was mildly reduced. The estimated ejection   fraction was in the range of 45% to 50%. Wall motion was normal;   there were no regional wall motion abnormalities. Doppler   parameters are consistent with abnormal left ventricular   relaxation (grade 1 diastolic dysfunction). - Aortic valve: There was trivial regurgitation. - Mitral valve: Mildly calcified annulus. There was mild   regurgitation. - Right atrium: Central venous pressure (est): 3 mm Hg. - Tricuspid valve: There was mild regurgitation. - Pulmonary arteries: Systolic pressure could not be accurately   estimated. - Pericardium, extracardiac: There was no pericardial effusion.  12/16 CTA PE >> 1. No evidence of thoracic aortic aneurysm or dissection. 2. Small bilateral effusions with associated bibasilar consolidative opacities with associated air bronchograms, atelectasis versus infiltrate. Follow-up chest radiograph in 4-6 weeks after treatment is recommended to ensure resolution. 3. Fluid distension of the mid and distal aspects of the esophagus as well as marked distension of the imaged superior aspect stomach - correlation for gastric outlet obstructive symptoms is advised. 4. Cardiomegaly with enlargement of the caliber the main pulmonary artery as could be seen in the setting of pulmonary arterial hypertension.  Micro Data:   Antimicrobials:  Pre op ancef 12/13 and 12/15  Interim history/subjective:  Completed 1 unit of PRBC  Objective   Blood pressure 97/75, pulse 77, temperature 98.2 F (36.8 C), temperature source  Bladder, resp. rate 16, height 5\' 11"  (1.803 m), weight 66.7 kg, SpO2 97 %.    Vent Mode: PRVC FiO2 (%):  [40 %-100 %] 40 % Set Rate:  [14 bmp-18 bmp] 14 bmp Vt Set:  [600 mL] 600 mL PEEP:  [5 cmH20] 5 cmH20 Plateau Pressure:  [15 cmH20-18 cmH20]  18 cmH20   Intake/Output Summary (Last 24 hours) at 09/08/2018 0830 Last data filed at 09/08/2018 0700 Gross per 24 hour  Intake 8572.72 ml  Output 1845 ml  Net 6727.72 ml   Filed Weights   09/03/18 1650  Weight: 66.7 kg   Examination: General: Middle-aged male who is heavily sedated on mechanical ventilatory support HEENT: Endotracheal tube in place.  No NG or OG tube is in place Neuro: Currently heavily sedated CV: Sounds irregular regular rate and rhythm PULM: Decreased breath sounds throughout GI: Nondistended, no current bowel sounds. Extremities: warm/dry, 1+ edema  Skin: no rashes or lesions      Resolved Hospital Problem list    Assessment & Plan:  Acute hemorraghic shock related to acute GIB- with coffee ground emesis and melena stools - also appeared to have GI outlet obstruction on CT chest  P:  Monitor intensive care unit Status post GI consult consult with interventional radiology embolizing duodenal ulcer Transfuse to keep hemoglobin greater than 8 Mean arterial pressure goal is greater than 60 Post volume replacement If not extubated consider placing NG tube  Acute respiratory insufficiency, required intubation mechanical ventilatory support.  Intubated 09/07/2018 at 7:40 PM  P:  Currently intubated, intubated more for airway protection during GI procedures No GI procedures currently scheduled Question of his going back to the OR with vascular surgery will confer with them if not needed evaluate for possible extubation  Left popliteal artery embolus s/p embolectomy 12/13 RLE hematoma w/ compartment s/p fasciotomy 12/15 P:  Continue to hold heparin due to GI bleed As per surgery involved in care for right lower leg extremity  DM P:  CBG q 4 SSI sensitive  09/08/2018.  Long-acting insulins for now and remain on sliding scale insulin for repeated episodes of hypoglycemia   Chronic systolic HF / DCM - EF 21-19% P:   Continue to monitor on  daily basis Careful IV fluids  Reported history of drug and substance abuse.  Note is positive for cocaine on 08/22/2018 Monitor for withdrawal Low threshold for CIWA protocol  Best practice:  Diet: NPO Pain/Anxiety/Delirium protocol, suspected recent history of alcohol drug abuse VAP protocol (if indicated): Yes DVT prophylaxis: Hold any type of anticoagulation due to GI bleed GI prophylaxis: starting PPI gtt Glucose control: SSI/ CBG Mobility: BR Code Status: Full Family Communication: 09/08/2018 no family at bedside Disposition: tx to ICU  Labs   CBC: Recent Labs  Lab 09/03/18 1325  09/05/18 0231 09/05/18 1044 09/06/18 0233 09/07/18 0349 09/07/18 1633 09/08/18 0255  WBC 9.4   < > 9.0  --  8.5 11.6* 10.8* 11.3*  NEUTROABS 6.8  --   --   --   --   --  10.4* 9.5*  HGB 9.8*   < > 7.2* 5.1* 9.5* 5.9* 6.8* 8.7*  HCT 31.9*   < > 23.1* 15.0* 29.2* 18.9* 20.3* 25.9*  MCV 88.9   < > 89.2  --  86.9 89.2 84.6 85.8  PLT 378   < > 268  --  279 312 216 231   < > = values in this interval not displayed.    Basic Metabolic Panel:  Recent Labs  Lab 09/03/18 2258  09/04/18 0724 09/05/18 0231 09/05/18 1044 09/06/18 0233 09/07/18 0349 09/07/18 1800 09/08/18 0255  NA  --   --  133* 131* 131* 137 136 139 140  K  --   --  3.1* 3.4* 3.2* 3.8 3.9 4.1 3.9  CL  --   --  84* 86*  --  88* 96* 104 108  CO2  --    < > 40* 30  --  36* 30 28 25   GLUCOSE  --   --  227* 196* 109* 242* 112* 231* 94  BUN  --   --  21* 16  --  8 26* 39* 31*  CREATININE  --   --  1.12 0.78  --  0.83 0.78 0.94 0.75  CALCIUM  --    < > 7.9* 7.3*  --  8.1* 7.2* 6.4* 7.2*  MG 2.1  --  2.0 2.0  --  2.1 2.0  --   --    < > = values in this interval not displayed.   GFR: Estimated Creatinine Clearance: 96.1 mL/min (by C-G formula based on SCr of 0.75 mg/dL). Recent Labs  Lab 09/06/18 0233 09/07/18 0349 09/07/18 1600 09/07/18 1633 09/08/18 0255  WBC 8.5 11.6*  --  10.8* 11.3*  LATICACIDVEN  --   --  1.7  --    --     Liver Function Tests: Recent Labs  Lab 09/04/18 0724 09/07/18 1800  AST 31 26  ALT 16 15  ALKPHOS 48 24*  BILITOT 0.5 0.6  PROT 4.9* 3.3*  ALBUMIN 2.5* 1.4*   No results for input(s): LIPASE, AMYLASE in the last 168 hours. Recent Labs  Lab 09/07/18 1630  AMMONIA 25    ABG    Component Value Date/Time   PHART 7.559 (H) 09/07/2018 1250   PCO2ART 34.1 09/07/2018 1250   PO2ART 317.0 (H) 09/07/2018 1250   HCO3 30.4 (H) 09/07/2018 1250   TCO2 31 09/07/2018 1250   ACIDBASEDEF 2.5 (H) 09/07/2018 1005   O2SAT 100.0 09/07/2018 1250     Coagulation Profile: Recent Labs  Lab 09/03/18 1325 09/07/18 1633  INR 1.03 1.28    Cardiac Enzymes: Recent Labs  Lab 09/03/18 2258 09/04/18 0724 09/04/18 1025  TROPONINI <0.03 <0.03 <0.03    HbA1C: Hgb A1c MFr Bld  Date/Time Value Ref Range Status  08/23/2018 03:01 AM 18.2 (H) 4.8 - 5.6 % Final    Comment:    (NOTE) Pre diabetes:          5.7%-6.4% Diabetes:              >6.4% Glycemic control for   <7.0% adults with diabetes   01/30/2018 03:44 AM >15.5 (H) 4.8 - 5.6 % Final    Comment:    (NOTE) **Verified by repeat analysis**         Prediabetes: 5.7 - 6.4         Diabetes: >6.4         Glycemic control for adults with diabetes: <7.0     CBG: Recent Labs  Lab 09/08/18 0414 09/08/18 0531 09/08/18 0711 09/08/18 0713 09/08/18 0819  GLUCAP 61* 118* 14* 64* 102*      Critical care time: 30 mins    Steve Minor ACNP Maryanna Shape PCCM Pager [phone] till 1 pm If no answer page [phone] 09/08/2018, 8:31 AM  Attending Note:  57 year old male with PMH of polysubstance abuse who presents to PCCM with  respiratory failure and GI bleeding after being anti-coagulated for a DVT.  On exam, he is alert and interactive, weaning.  I reviewed CXR myself, ETT is in a good position.  Discussed with PCCM-NP.  Will proceed with extubation today.  Wean pressors to off.  Continue protonix.  Appreciate input from GI.   PCCM will continue to follow.  If airway is stable later will consider transfer to SDU and back to [facility] with PCCM off 12/19.  The patient is critically ill with multiple organ systems failure and requires high complexity decision making for assessment and support, frequent evaluation and titration of therapies, application of advanced monitoring technologies and extensive interpretation of multiple databases.   Critical Care Time devoted to patient care services described in this note is  33  Minutes. This time reflects time of care of this signee Dr Jennet Maduro. This critical care time does not reflect procedure time, or teaching time or supervisory time of PA/NP/Med student/Med Resident etc but could involve care discussion time.  Rush Farmer, M.D. [facility] Pulmonary/Critical Care Medicine. Pager: [phone]. After hours pager: [phone].

## 2018-09-08 NOTE — Progress Notes (Signed)
0400 CBG 61. Elink MD notified. Orders given.

## 2018-09-08 NOTE — Progress Notes (Signed)
Called report to nurse Reatha Armour, RN on [phone]. Patient transported to 5W via bed, 4L of nasal cannula, and on tele monitoring. Patient belongings are with the patient. Transported without any complications.

## 2018-09-08 NOTE — Progress Notes (Signed)
   VASCULAR SURGERY ASSESSMENT & PLAN:   UPPER GI BLEED: The patient underwent endoscopy yesterday and was found to have a large clot in the duodenal bulb.  He underwent coil embolization of the gastroduodenal artery last night.  He has had no further bleeding.  STATUS POST RIGHT POPLITEAL AND TIBIAL EMBOLECTOMY: The patient has brisk Doppler signals in the right foot.  We are obviously having to not anticoagulate the patient although the source of his embolization is not clear.  When he is stable we will need to change his VAC.  SUBJECTIVE:   Sedated on vent  PHYSICAL EXAM:   Vitals:   09/08/18 0300 09/08/18 0400 09/08/18 0500 09/08/18 0600  BP: (!) 85/64 (!) _0  Pulse:      Resp: _1 Temp: 98.4 F (36.9 C) 98.8 F (37.1 C) 98.6 F (37 C) 98.2 F (36.8 C)  TempSrc:      SpO2: 96%     Weight:      Height:       Brisk Doppler signals right foot. Both  VAC's have a good seal.  LABS:   Lab Results  Component Value Date   WBC 11.3 (H) 09/08/2018   HGB 8.7 (L) 09/08/2018   HCT 25.9 (L) 09/08/2018   MCV 85.8 09/08/2018   PLT 231 09/08/2018   Lab Results  Component Value Date   CREATININE 0.75 09/08/2018   Lab Results  Component Value Date   INR 1.28 09/07/2018   CBG (last 3)  Recent Labs    09/08/18 0025 09/08/18 0414 09/08/18 0531  GLUCAP 121* 61* 118*    PROBLEM LIST:    Principal Problem:   Popliteal artery embolus (HCC) Active Problems:   DM (diabetes mellitus), type 2, uncontrolled (HCC)   Essential hypertension   Dilated cardiomyopathy (HCC)   Chest pain   AAA (abdominal aortic aneurysm) (HCC)   Ischemic foot   Right leg claudication (HCC)   PVD (peripheral vascular disease) ([place])   Hemorrhagic shock ([place])   CURRENT MEDS:   . sodium chloride   Intravenous Once  . sodium chloride   Intravenous Once  . sodium chloride   Intravenous Once  . sodium chloride   Intravenous Once  . atorvastatin  20 mg Oral Daily  .  chlorhexidine gluconate (MEDLINE KIT)  15 mL Mouth Rinse BID  . fentaNYL (SUBLIMAZE) injection  100 mcg Intravenous Once  . insulin aspart  0-9 Units Subcutaneous TID WC  . insulin detemir  20 Units Subcutaneous BID  . ipratropium-albuterol  3 mL Nebulization Q6H  . lidocaine      . mouth rinse  15 mL Mouth Rinse 10 times per day    [name] Beeper: [phone] Office: [phone] 09/08/2018

## 2018-09-08 NOTE — Progress Notes (Signed)
Referring Physician(s): Dr Chase Caller  Supervising Physician: Jacqulynn Cadet  Patient Status:  [facility] - In-pt  Chief Complaint:  GI Bleed  Subjective:  12/17: Procedure: Mesenteric angiogram and coil embolization of the GDA Intubated Trying to communicate Moving all 4s Hg 8.7 this am   Allergies: Lisinopril  Medications: Prior to Admission medications   Medication Sig Start Date End Date Taking? Authorizing Provider  amLODipine (NORVASC) 10 MG tablet Take 1 tablet (10 mg total) by mouth daily. 08/25/18  Yes Ghimire, Henreitta Leber, MD  atorvastatin (LIPITOR) 20 MG tablet Take 1 tablet (20 mg total) by mouth daily. 08/25/18  Yes Ghimire, Henreitta Leber, MD  cyclobenzaprine (FLEXERIL) 10 MG tablet Take 1 tablet (10 mg total) by mouth 2 (two) times daily as needed for muscle spasms. 08/25/18  Yes Ghimire, Henreitta Leber, MD  gabapentin (NEURONTIN) 300 MG capsule Take 1 capsule (300 mg total) by mouth 3 (three) times daily. 08/25/18  Yes Ghimire, Henreitta Leber, MD  insulin aspart (NOVOLOG) 100 UNIT/ML injection Inject 0-15 Units into the skin 3 (three) times daily with meals. 0-15 Units, Subcutaneous, 3 times daily with meals CBG < 70: implement hypoglycemia protocol CBG 70 - 120: 0 units CBG 121 - 150: 2 units CBG 151 - 200: 3 units CBG 201 - 250: 5 units CBG 251 - 300: 8 units CBG 301 - 350: 11 units CBG 351 - 400: 15 units CBG > 400: call MD 08/25/18  Yes Ghimire, Henreitta Leber, MD  insulin detemir (LEVEMIR) 100 UNIT/ML injection Inject 0.3 mLs (30 Units total) into the skin 2 (two) times daily. Patient taking differently: Inject 20-30 Units into the skin See admin instructions. Take 30 units in the morning, and 20 units in the evening 08/25/18  Yes Ghimire, Henreitta Leber, MD  metFORMIN (GLUCOPHAGE) 1000 MG tablet Take 1 tablet (1,000 mg total) by mouth 2 (two) times daily with a meal. 08/25/18  Yes Ghimire, Henreitta Leber, MD  nitroGLYCERIN (NITROSTAT) 0.4 MG SL tablet Place 1 tablet (0.4 mg total) under  the tongue every 5 (five) minutes as needed for chest pain. 08/25/18  Yes Ghimire, Henreitta Leber, MD  dicyclomine (BENTYL) 20 MG tablet Take 1 tablet (20 mg total) by mouth 2 (two) times daily. Patient not taking: Reported on 09/05/2018 08/25/18   Jonetta Osgood, MD  glucose blood (AGAMATRIX PRESTO TEST) test strip Check blood sugars prior to meals and at bedtime 08/25/18   Jonetta Osgood, MD     Vital Signs: BP (!) 128/105   Pulse 83   Temp 98.6 F (37 C)   Resp 17   Ht 5\' 11"  (1.803 m)   Wt 147 lb 0.8 oz (66.7 kg)   SpO2 100%   BMI 20.51 kg/m   Physical Exam Vitals signs reviewed.  Pulmonary:     Comments: intubated Skin:    General: Skin is warm and dry.     Comments: Rt groin NT no bleeding No hematoma Rt foot doppler + pulse     Imaging: Dg Abd 1 View  Result Date: 09/07/2018 CLINICAL DATA:  OG tube placement. EXAM: ABDOMEN - 1 VIEW COMPARISON:  CT abdomen pelvis 08/22/2018. FINDINGS: OG tube is in place with the side port and tip in the body of the stomach. The stomach is distended. IMPRESSION: OG tube in good position. Electronically Signed   By: Inge Rise M.D.   On: 09/07/2018 13:31   Ct Angio Chest Pe W Or Wo Contrast  Result Date: 09/06/2018 CLINICAL  DATA:  Evaluate thoracic aortic aneurysm. EXAM: CT ANGIOGRAPHY CHEST WITH CONTRAST TECHNIQUE: Multidetector CT imaging of the chest was performed using the standard protocol during bolus administration of intravenous contrast. Multiplanar CT image reconstructions and MIPs were obtained to evaluate the vascular anatomy. CONTRAST:  65 mL ISOVUE-370 IOPAMIDOL (ISOVUE-370) INJECTION 76% COMPARISON:  01/30/2018; 10/14/2016; 04/25/2026 FINDINGS: Vascular Findings: No evidence of thoracic aortic aneurysm with measurements as follows. No definite evidence of thoracic aortic dissection or periaortic stranding on this nongated examination. Bovine configuration of the aortic arch. The branch vessels of the aortic arch  appear widely patent throughout their imaged courses. The descending thoracic aorta is of normal caliber and widely patent without hemodynamically significant stenosis. There is a very minimal amount of atherosclerotic plaque involving the proximal aspect of the descending thoracic aorta. Cardiomegaly.  No pericardial effusion. Although this examination was not tailored for the evaluation the pulmonary arteries, there are no discrete filling defects within the central pulmonary arterial tree to suggest central pulmonary embolism. Borderline enlarged caliber the main pulmonary artery measuring 32 mm in diameter (image 67, series 6) ------------------------------------------------------------- Thoracic aortic measurements: Sinotubular junction 33 mm as measured in greatest oblique coronal dimension. Proximal ascending aorta 35 mm as measured in greatest oblique axial dimension at the level of the main pulmonary artery (image 70, series 6); and approximately 36 mm in greatest oblique short axis coronal diameter (coronal image 48, series 9). Aortic arch aorta 28 mm as measured in greatest oblique sagittal dimension. Proximal descending thoracic aorta 30 mm as measured in greatest oblique axial dimension at the level of the main pulmonary artery. Distal descending thoracic aorta 29 mm as measured in greatest oblique axial dimension at the level of the diaphragmatic hiatus. Review of the MIP images confirms the above findings. ------------------------------------------------------------- Non-Vascular Findings: Mediastinum/Lymph Nodes: Prominent right hilar lymph nodes are not enlarged by size criteria with index right suprahilar lymph node measuring 0.8 cm in greatest short axis diameter (image 71, series 6) and index right infrahilar lymph node measuring 0.7 cm (image 88). No definitive bulky mediastinal, hilar or axillary lymphadenopathy. Lungs/Pleura: Interval development of small bilateral pleural effusions with  worsening bibasilar consolidative opacities and associated air bronchograms, right greater than left. Interval development of an approximately 1.0 x 0.5 cm subpleural consolidative opacity within the right upper lobe (image 36, series 7). Unchanged punctate (approximately 3 mm) calcified granuloma within the left lower lobe (image 75, series 3). Evaluation for additional pulmonary nodules is degraded secondary to bibasilar airspace opacities. The central pulmonary airways appear widely patent. Upper abdomen: Fluid distention of the mid and distal aspects of the esophagus with marked distension of the imaged superior aspect of the stomach. Musculoskeletal: No acute or aggressive osseous abnormalities. There is incomplete fusion involving the spinous processes of multiple thoracic vertebral bodies, unchanged. Regional soft tissues appear normal. Normal appearance of the thyroid gland. IMPRESSION: 1. No evidence of thoracic aortic aneurysm or dissection. 2. Small bilateral effusions with associated bibasilar consolidative opacities with associated air bronchograms, atelectasis versus infiltrate. Follow-up chest radiograph in 4-6 weeks after treatment is recommended to ensure resolution. 3. Fluid distension of the mid and distal aspects of the esophagus as well as marked distension of the imaged superior aspect stomach - correlation for gastric outlet obstructive symptoms is advised. 4. Cardiomegaly with enlargement of the caliber the main pulmonary artery as could be seen in the setting of pulmonary arterial hypertension. Electronically Signed   By: Sandi Mariscal M.D.   On: 09/06/2018 09:20  Ir Angiogram Visceral Selective  Result Date: 09/07/2018 INDICATION: 57 year old male with large volume upper GI bleed secondary to a very large duodenal ulcer. Patient underwent endoscopic intervention but continues to require pressors and large volume transfusion. He presents for mesenteric arteriography and embolization of  any active bleeding and prophylactic embolization of the gastroduodenal artery. EXAM: IR ULTRASOUND GUIDANCE VASC ACCESS RIGHT; ADDITIONAL ARTERIOGRAPHY; IR EMBO ART VEN HEMORR LYMPH EXTRAV INC GUIDE ROADMAPPING; SELECTIVE VISCERAL ARTERIOGRAPHY 1. Ultrasound-guided vascular access 2. Catheterization of the left gastric artery where it arises directly from the aorta with arteriogram 3. Catheterization of the celiac artery with arteriogram 4. Catheterization of the common hepatic artery with arteriogram 5. Catheterization of the gastroduodenal artery with arteriogram 6. Coil embolization of the gastroduodenal artery 7. Catheterization of the superior mesenteric artery with arteriogram MEDICATIONS: None ANESTHESIA/SEDATION: Patient intubated and on propofol drip CONTRAST:  70 mL Isovue 370 FLUOROSCOPY TIME:  Fluoroscopy Time: 11 minutes 30 seconds (487 mGy). COMPLICATIONS: None immediate. PROCEDURE: Informed consent was obtained from the patient following explanation of the procedure, risks, benefits and alternatives. The patient understands, agrees and consents for the procedure. All questions were addressed. A time out was performed prior to the initiation of the procedure. Maximal barrier sterile technique utilized including caps, mask, sterile gowns, sterile gloves, large sterile drape, hand hygiene, and Betadine prep. The right common femoral artery was interrogated with ultrasound and found to be widely patent. An image was obtained and stored for the medical record. Local anesthesia was attained by infiltration with 1% lidocaine. A small dermatotomy was made. Under real-time sonographic guidance, the vessel was punctured with a 21 gauge micropuncture needle. Using standard technique, the initial micro needle was exchanged over a 0.018 micro wire for a transitional 4 [place] micro sheath. The micro sheath was then exchanged over a 0.035 wire for a 5 French vascular sheath. A C2 cobra catheter was advanced in the  abdominal aorta over a Bentson wire. The catheter was used to select the first vessel arising from the ventral aorta. Contrast was injected. This is a left gastric artery arising directly from the aorta. Anatomy is normal. No evidence of active bleeding. The Cobra catheter was then used to select the origin of the celiac artery. Arteriography was performed. The splenic artery is small in caliber. The hepatic artery demonstrates normal hepatic arterial anatomy. The gastroduodenal artery is slightly irregular with spasm in the proximal segment. No evidence of active extravasation. A glidewire was successfully advanced into the common hepatic artery and the C2 cobra catheter was advanced into the common hepatic artery. Additional arteriography was performed in multiple obliquities. Again, the proximal gastroduodenal artery is irregular and spasmodic. No evidence of dissection, aneurysm or active bleeding. A Lantern microcatheter was then advanced over a Fathom 16 wire and used to select the distal aspect of the gastroduodenal artery. Arteriography was performed. The gastroepiploic arteries are widely patent. No evidence of active bleeding. Coil embolization was then performed using a 4 mm Ruby POD detachable micro coil followed by a 45 cm Ruby packing coil. Post embolization arteriography demonstrates complete occlusion of the vessel with stasis in the proximal segment. The Lantern microcatheter was brought back into the common hepatic artery and arteriography was performed. Again, complete occlusion of the gastroduodenal artery. The right gastric artery is visualized. No evidence of active bleeding from the right gastric artery. The microcatheter was removed. The C2 cobra catheter was used to select the superior mesenteric artery. Arteriography was performed. Patent pancreaticoduodenal arcade supplying  the gastroepiploic artery. No evidence of active hemorrhage. The C2 cobra catheter was removed. A limited right  common femoral arteriogram was performed confirming common femoral arterial access. Hemostasis was attained with the assistance of a Cordis ExoSeal extra arterial vascular plug. IMPRESSION: 1. No evidence of active hemorrhage. 2. 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 3. Prophylactic coil embolization of the gastroduodenal artery. Signed, Criselda Peaches, MD, [facility] Radiology Electronically Signed   By: Jacqulynn Cadet M.D.   On: 09/07/2018 22:32   Ir Angiogram Visceral Selective  Result Date: 09/07/2018 INDICATION: 57 year old male with large volume upper GI bleed secondary to a very large duodenal ulcer. Patient underwent endoscopic intervention but continues to require pressors and large volume transfusion. He presents for mesenteric arteriography and embolization of any active bleeding and prophylactic embolization of the gastroduodenal artery. EXAM: IR ULTRASOUND GUIDANCE VASC ACCESS RIGHT; ADDITIONAL ARTERIOGRAPHY; IR EMBO ART VEN HEMORR LYMPH EXTRAV INC GUIDE ROADMAPPING; SELECTIVE VISCERAL ARTERIOGRAPHY 1. Ultrasound-guided vascular access 2. Catheterization of the left gastric artery where it arises directly from the aorta with arteriogram 3. Catheterization of the celiac artery with arteriogram 4. Catheterization of the common hepatic artery with arteriogram 5. Catheterization of the gastroduodenal artery with arteriogram 6. Coil embolization of the gastroduodenal artery 7. Catheterization of the superior mesenteric artery with arteriogram MEDICATIONS: None ANESTHESIA/SEDATION: Patient intubated and on propofol drip CONTRAST:  70 mL Isovue 370 FLUOROSCOPY TIME:  Fluoroscopy Time: 11 minutes 30 seconds (487 mGy). COMPLICATIONS: None immediate. PROCEDURE: Informed consent was obtained from the patient following explanation of the procedure, risks, benefits and alternatives. The patient understands, agrees and  consents for the procedure. All questions were addressed. A time out was performed prior to the initiation of the procedure. Maximal barrier sterile technique utilized including caps, mask, sterile gowns, sterile gloves, large sterile drape, hand hygiene, and Betadine prep. The right common femoral artery was interrogated with ultrasound and found to be widely patent. An image was obtained and stored for the medical record. Local anesthesia was attained by infiltration with 1% lidocaine. A small dermatotomy was made. Under real-time sonographic guidance, the vessel was punctured with a 21 gauge micropuncture needle. Using standard technique, the initial micro needle was exchanged over a 0.018 micro wire for a transitional 4 [place] micro sheath. The micro sheath was then exchanged over a 0.035 wire for a 5 French vascular sheath. A C2 cobra catheter was advanced in the abdominal aorta over a Bentson wire. The catheter was used to select the first vessel arising from the ventral aorta. Contrast was injected. This is a left gastric artery arising directly from the aorta. Anatomy is normal. No evidence of active bleeding. The Cobra catheter was then used to select the origin of the celiac artery. Arteriography was performed. The splenic artery is small in caliber. The hepatic artery demonstrates normal hepatic arterial anatomy. The gastroduodenal artery is slightly irregular with spasm in the proximal segment. No evidence of active extravasation. A glidewire was successfully advanced into the common hepatic artery and the C2 cobra catheter was advanced into the common hepatic artery. Additional arteriography was performed in multiple obliquities. Again, the proximal gastroduodenal artery is irregular and spasmodic. No evidence of dissection, aneurysm or active bleeding. A Lantern microcatheter was then advanced over a Fathom 16 wire and used to select the distal aspect of the gastroduodenal artery. Arteriography was  performed. The gastroepiploic arteries are widely patent. No evidence of active bleeding. Coil embolization was then  performed using a 4 mm Ruby POD detachable micro coil followed by a 45 cm Ruby packing coil. Post embolization arteriography demonstrates complete occlusion of the vessel with stasis in the proximal segment. The Lantern microcatheter was brought back into the common hepatic artery and arteriography was performed. Again, complete occlusion of the gastroduodenal artery. The right gastric artery is visualized. No evidence of active bleeding from the right gastric artery. The microcatheter was removed. The C2 cobra catheter was used to select the superior mesenteric artery. Arteriography was performed. Patent pancreaticoduodenal arcade supplying the gastroepiploic artery. No evidence of active hemorrhage. The C2 cobra catheter was removed. A limited right common femoral arteriogram was performed confirming common femoral arterial access. Hemostasis was attained with the assistance of a Cordis ExoSeal extra arterial vascular plug. IMPRESSION: 1. No evidence of active hemorrhage. 2. 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 3. Prophylactic coil embolization of the gastroduodenal artery. Signed, Criselda Peaches, MD, [facility] Radiology Electronically Signed   By: Jacqulynn Cadet M.D.   On: 09/07/2018 22:32   Ir Angiogram Selective Each Additional Vessel  Result Date: 09/07/2018 INDICATION: 57 year old male with large volume upper GI bleed secondary to a very large duodenal ulcer. Patient underwent endoscopic intervention but continues to require pressors and large volume transfusion. He presents for mesenteric arteriography and embolization of any active bleeding and prophylactic embolization of the gastroduodenal artery. EXAM: IR ULTRASOUND GUIDANCE VASC ACCESS RIGHT; ADDITIONAL ARTERIOGRAPHY; IR EMBO ART VEN HEMORR  LYMPH EXTRAV INC GUIDE ROADMAPPING; SELECTIVE VISCERAL ARTERIOGRAPHY 1. Ultrasound-guided vascular access 2. Catheterization of the left gastric artery where it arises directly from the aorta with arteriogram 3. Catheterization of the celiac artery with arteriogram 4. Catheterization of the common hepatic artery with arteriogram 5. Catheterization of the gastroduodenal artery with arteriogram 6. Coil embolization of the gastroduodenal artery 7. Catheterization of the superior mesenteric artery with arteriogram MEDICATIONS: None ANESTHESIA/SEDATION: Patient intubated and on propofol drip CONTRAST:  70 mL Isovue 370 FLUOROSCOPY TIME:  Fluoroscopy Time: 11 minutes 30 seconds (487 mGy). COMPLICATIONS: None immediate. PROCEDURE: Informed consent was obtained from the patient following explanation of the procedure, risks, benefits and alternatives. The patient understands, agrees and consents for the procedure. All questions were addressed. A time out was performed prior to the initiation of the procedure. Maximal barrier sterile technique utilized including caps, mask, sterile gowns, sterile gloves, large sterile drape, hand hygiene, and Betadine prep. The right common femoral artery was interrogated with ultrasound and found to be widely patent. An image was obtained and stored for the medical record. Local anesthesia was attained by infiltration with 1% lidocaine. A small dermatotomy was made. Under real-time sonographic guidance, the vessel was punctured with a 21 gauge micropuncture needle. Using standard technique, the initial micro needle was exchanged over a 0.018 micro wire for a transitional 4 [place] micro sheath. The micro sheath was then exchanged over a 0.035 wire for a 5 French vascular sheath. A C2 cobra catheter was advanced in the abdominal aorta over a Bentson wire. The catheter was used to select the first vessel arising from the ventral aorta. Contrast was injected. This is a left gastric artery  arising directly from the aorta. Anatomy is normal. No evidence of active bleeding. The Cobra catheter was then used to select the origin of the celiac artery. Arteriography was performed. The splenic artery is small in caliber. The hepatic artery demonstrates normal hepatic arterial anatomy. The gastroduodenal artery is slightly irregular with spasm  in the proximal segment. No evidence of active extravasation. A glidewire was successfully advanced into the common hepatic artery and the C2 cobra catheter was advanced into the common hepatic artery. Additional arteriography was performed in multiple obliquities. Again, the proximal gastroduodenal artery is irregular and spasmodic. No evidence of dissection, aneurysm or active bleeding. A Lantern microcatheter was then advanced over a Fathom 16 wire and used to select the distal aspect of the gastroduodenal artery. Arteriography was performed. The gastroepiploic arteries are widely patent. No evidence of active bleeding. Coil embolization was then performed using a 4 mm Ruby POD detachable micro coil followed by a 45 cm Ruby packing coil. Post embolization arteriography demonstrates complete occlusion of the vessel with stasis in the proximal segment. The Lantern microcatheter was brought back into the common hepatic artery and arteriography was performed. Again, complete occlusion of the gastroduodenal artery. The right gastric artery is visualized. No evidence of active bleeding from the right gastric artery. The microcatheter was removed. The C2 cobra catheter was used to select the superior mesenteric artery. Arteriography was performed. Patent pancreaticoduodenal arcade supplying the gastroepiploic artery. No evidence of active hemorrhage. The C2 cobra catheter was removed. A limited right common femoral arteriogram was performed confirming common femoral arterial access. Hemostasis was attained with the assistance of a Cordis ExoSeal extra arterial vascular  plug. IMPRESSION: 1. No evidence of active hemorrhage. 2. 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 3. Prophylactic coil embolization of the gastroduodenal artery. Signed, Criselda Peaches, MD, [place] [facility] Radiology Electronically Signed   By: Jacqulynn Cadet M.D.   On: 09/07/2018 22:32   Ir Angiogram Selective Each Additional Vessel  Result Date: 09/07/2018 INDICATION: 57 year old male with large volume upper GI bleed secondary to a very large duodenal ulcer. Patient underwent endoscopic intervention but continues to require pressors and large volume transfusion. He presents for mesenteric arteriography and embolization of any active bleeding and prophylactic embolization of the gastroduodenal artery. EXAM: IR ULTRASOUND GUIDANCE VASC ACCESS RIGHT; ADDITIONAL ARTERIOGRAPHY; IR EMBO ART VEN HEMORR LYMPH EXTRAV INC GUIDE ROADMAPPING; SELECTIVE VISCERAL ARTERIOGRAPHY 1. Ultrasound-guided vascular access 2. Catheterization of the left gastric artery where it arises directly from the aorta with arteriogram 3. Catheterization of the celiac artery with arteriogram 4. Catheterization of the common hepatic artery with arteriogram 5. Catheterization of the gastroduodenal artery with arteriogram 6. Coil embolization of the gastroduodenal artery 7. Catheterization of the superior mesenteric artery with arteriogram MEDICATIONS: None ANESTHESIA/SEDATION: Patient intubated and on propofol drip CONTRAST:  70 mL Isovue 370 FLUOROSCOPY TIME:  Fluoroscopy Time: 11 minutes 30 seconds (487 mGy). COMPLICATIONS: None immediate. PROCEDURE: Informed consent was obtained from the patient following explanation of the procedure, risks, benefits and alternatives. The patient understands, agrees and consents for the procedure. All questions were addressed. A time out was performed prior to the initiation of the procedure. Maximal barrier sterile technique  utilized including caps, mask, sterile gowns, sterile gloves, large sterile drape, hand hygiene, and Betadine prep. The right common femoral artery was interrogated with ultrasound and found to be widely patent. An image was obtained and stored for the medical record. Local anesthesia was attained by infiltration with 1% lidocaine. A small dermatotomy was made. Under real-time sonographic guidance, the vessel was punctured with a 21 gauge micropuncture needle. Using standard technique, the initial micro needle was exchanged over a 0.018 micro wire for a transitional 4 [place] micro sheath. The micro sheath was then exchanged over a 0.035 wire for  a 5 [place] vascular sheath. A C2 cobra catheter was advanced in the abdominal aorta over a Bentson wire. The catheter was used to select the first vessel arising from the ventral aorta. Contrast was injected. This is a left gastric artery arising directly from the aorta. Anatomy is normal. No evidence of active bleeding. The Cobra catheter was then used to select the origin of the celiac artery. Arteriography was performed. The splenic artery is small in caliber. The hepatic artery demonstrates normal hepatic arterial anatomy. The gastroduodenal artery is slightly irregular with spasm in the proximal segment. No evidence of active extravasation. A glidewire was successfully advanced into the common hepatic artery and the C2 cobra catheter was advanced into the common hepatic artery. Additional arteriography was performed in multiple obliquities. Again, the proximal gastroduodenal artery is irregular and spasmodic. No evidence of dissection, aneurysm or active bleeding. A Lantern microcatheter was then advanced over a Fathom 16 wire and used to select the distal aspect of the gastroduodenal artery. Arteriography was performed. The gastroepiploic arteries are widely patent. No evidence of active bleeding. Coil embolization was then performed using a 4 mm Ruby POD detachable  micro coil followed by a 45 cm Ruby packing coil. Post embolization arteriography demonstrates complete occlusion of the vessel with stasis in the proximal segment. The Lantern microcatheter was brought back into the common hepatic artery and arteriography was performed. Again, complete occlusion of the gastroduodenal artery. The right gastric artery is visualized. No evidence of active bleeding from the right gastric artery. The microcatheter was removed. The C2 cobra catheter was used to select the superior mesenteric artery. Arteriography was performed. Patent pancreaticoduodenal arcade supplying the gastroepiploic artery. No evidence of active hemorrhage. The C2 cobra catheter was removed. A limited right common femoral arteriogram was performed confirming common femoral arterial access. Hemostasis was attained with the assistance of a Cordis ExoSeal extra arterial vascular plug. IMPRESSION: 1. No evidence of active hemorrhage. 2. 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 3. Prophylactic coil embolization of the gastroduodenal artery. Signed, Criselda Peaches, MD, [facility] Radiology Electronically Signed   By: Jacqulynn Cadet M.D.   On: 09/07/2018 22:32   Ir US Guide Vasc Access Right  Result Date: 09/07/2018 INDICATION: 57 year old male with large volume upper GI bleed secondary to a very large duodenal ulcer. Patient underwent endoscopic intervention but continues to require pressors and large volume transfusion. He presents for mesenteric arteriography and embolization of any active bleeding and prophylactic embolization of the gastroduodenal artery. EXAM: IR ULTRASOUND GUIDANCE VASC ACCESS RIGHT; ADDITIONAL ARTERIOGRAPHY; IR EMBO ART VEN HEMORR LYMPH EXTRAV INC GUIDE ROADMAPPING; SELECTIVE VISCERAL ARTERIOGRAPHY 1. Ultrasound-guided vascular access 2. Catheterization of the left gastric artery where it arises directly  from the aorta with arteriogram 3. Catheterization of the celiac artery with arteriogram 4. Catheterization of the common hepatic artery with arteriogram 5. Catheterization of the gastroduodenal artery with arteriogram 6. Coil embolization of the gastroduodenal artery 7. Catheterization of the superior mesenteric artery with arteriogram MEDICATIONS: None ANESTHESIA/SEDATION: Patient intubated and on propofol drip CONTRAST:  70 mL Isovue 370 FLUOROSCOPY TIME:  Fluoroscopy Time: 11 minutes 30 seconds (487 mGy). COMPLICATIONS: None immediate. PROCEDURE: Informed consent was obtained from the patient following explanation of the procedure, risks, benefits and alternatives. The patient understands, agrees and consents for the procedure. All questions were addressed. A time out was performed prior to the initiation of the procedure. Maximal barrier sterile technique utilized including caps, mask, sterile gowns,  sterile gloves, large sterile drape, hand hygiene, and Betadine prep. The right common femoral artery was interrogated with ultrasound and found to be widely patent. An image was obtained and stored for the medical record. Local anesthesia was attained by infiltration with 1% lidocaine. A small dermatotomy was made. Under real-time sonographic guidance, the vessel was punctured with a 21 gauge micropuncture needle. Using standard technique, the initial micro needle was exchanged over a 0.018 micro wire for a transitional 4 [place] micro sheath. The micro sheath was then exchanged over a 0.035 wire for a 5 [place] vascular sheath. A C2 cobra catheter was advanced in the abdominal aorta over a Bentson wire. The catheter was used to select the first vessel arising from the ventral aorta. Contrast was injected. This is a left gastric artery arising directly from the aorta. Anatomy is normal. No evidence of active bleeding. The Cobra catheter was then used to select the origin of the celiac artery. Arteriography was  performed. The splenic artery is small in caliber. The hepatic artery demonstrates normal hepatic arterial anatomy. The gastroduodenal artery is slightly irregular with spasm in the proximal segment. No evidence of active extravasation. A glidewire was successfully advanced into the common hepatic artery and the C2 cobra catheter was advanced into the common hepatic artery. Additional arteriography was performed in multiple obliquities. Again, the proximal gastroduodenal artery is irregular and spasmodic. No evidence of dissection, aneurysm or active bleeding. A Lantern microcatheter was then advanced over a Fathom 16 wire and used to select the distal aspect of the gastroduodenal artery. Arteriography was performed. The gastroepiploic arteries are widely patent. No evidence of active bleeding. Coil embolization was then performed using a 4 mm Ruby POD detachable micro coil followed by a 45 cm Ruby packing coil. Post embolization arteriography demonstrates complete occlusion of the vessel with stasis in the proximal segment. The Lantern microcatheter was brought back into the common hepatic artery and arteriography was performed. Again, complete occlusion of the gastroduodenal artery. The right gastric artery is visualized. No evidence of active bleeding from the right gastric artery. The microcatheter was removed. The C2 cobra catheter was used to select the superior mesenteric artery. Arteriography was performed. Patent pancreaticoduodenal arcade supplying the gastroepiploic artery. No evidence of active hemorrhage. The C2 cobra catheter was removed. A limited right common femoral arteriogram was performed confirming common femoral arterial access. Hemostasis was attained with the assistance of a Cordis ExoSeal extra arterial vascular plug. IMPRESSION: 1. No evidence of active hemorrhage. 2. 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 3. Prophylactic coil embolization of the gastroduodenal  artery. Signed, Criselda Peaches, MD, [facility] Radiology Electronically Signed   By: Jacqulynn Cadet M.D.   On: 09/07/2018 22:32   Dg Chest Port 1 View  Result Date: 09/07/2018 CLINICAL DATA:  Status post fasciotomy and hematoma back UA shins in the right lower leg 2 days ago. Intubated patient. EXAM: PORTABLE CHEST 1 VIEW COMPARISON:  CT scan of the chest of September 06, 2018 FINDINGS: The lungs are well-expanded. There are patchy increased densities at both bases which are slightly more conspicuous today. There is a small right and and somewhat larger left pleural effusion. The heart and pulmonary vascularity are normal. The endotracheal tube tip projects 4.1 cm above the carina. The esophagogastric tube tip in proximal port project below the GE junction. The left internal jugular venous catheter tip projects over the midportion of the SVC. IMPRESSION: Bibasilar atelectasis or pneumonia. Small  right pleural effusion and somewhat larger left pleural effusion. The support tubes are in reasonable position. Electronically Signed   By: David  Martinique M.D.   On: 09/07/2018 13:27   [place] Guide Roadmapping  Result Date: 09/07/2018 INDICATION: 58 year old male with large volume upper GI bleed secondary to a very large duodenal ulcer. Patient underwent endoscopic intervention but continues to require pressors and large volume transfusion. He presents for mesenteric arteriography and embolization of any active bleeding and prophylactic embolization of the gastroduodenal artery. EXAM: IR ULTRASOUND GUIDANCE VASC ACCESS RIGHT; ADDITIONAL ARTERIOGRAPHY; IR EMBO ART VEN HEMORR LYMPH EXTRAV INC GUIDE ROADMAPPING; SELECTIVE VISCERAL ARTERIOGRAPHY 1. Ultrasound-guided vascular access 2. Catheterization of the left gastric artery where it arises directly from the aorta with arteriogram 3. Catheterization of the celiac artery  with arteriogram 4. Catheterization of the common hepatic artery with arteriogram 5. Catheterization of the gastroduodenal artery with arteriogram 6. Coil embolization of the gastroduodenal artery 7. Catheterization of the superior mesenteric artery with arteriogram MEDICATIONS: None ANESTHESIA/SEDATION: Patient intubated and on propofol drip CONTRAST:  70 mL Isovue 370 FLUOROSCOPY TIME:  Fluoroscopy Time: 11 minutes 30 seconds (487 mGy). COMPLICATIONS: None immediate. PROCEDURE: Informed consent was obtained from the patient following explanation of the procedure, risks, benefits and alternatives. The patient understands, agrees and consents for the procedure. All questions were addressed. A time out was performed prior to the initiation of the procedure. Maximal barrier sterile technique utilized including caps, mask, sterile gowns, sterile gloves, large sterile drape, hand hygiene, and Betadine prep. The right common femoral artery was interrogated with ultrasound and found to be widely patent. An image was obtained and stored for the medical record. Local anesthesia was attained by infiltration with 1% lidocaine. A small dermatotomy was made. Under real-time sonographic guidance, the vessel was punctured with a 21 gauge micropuncture needle. Using standard technique, the initial micro needle was exchanged over a 0.018 micro wire for a transitional 4 [place] micro sheath. The micro sheath was then exchanged over a 0.035 wire for a 5 [place] vascular sheath. A C2 cobra catheter was advanced in the abdominal aorta over a Bentson wire. The catheter was used to select the first vessel arising from the ventral aorta. Contrast was injected. This is a left gastric artery arising directly from the aorta. Anatomy is normal. No evidence of active bleeding. The Cobra catheter was then used to select the origin of the celiac artery. Arteriography was performed. The splenic artery is small in caliber. The hepatic artery  demonstrates normal hepatic arterial anatomy. The gastroduodenal artery is slightly irregular with spasm in the proximal segment. No evidence of active extravasation. A glidewire was successfully advanced into the common hepatic artery and the C2 cobra catheter was advanced into the common hepatic artery. Additional arteriography was performed in multiple obliquities. Again, the proximal gastroduodenal artery is irregular and spasmodic. No evidence of dissection, aneurysm or active bleeding. A Lantern microcatheter was then advanced over a Fathom 16 wire and used to select the distal aspect of the gastroduodenal artery. Arteriography was performed. The gastroepiploic arteries are widely patent. No evidence of active bleeding. Coil embolization was then performed using a 4 mm Ruby POD detachable micro coil followed by a 45 cm Ruby packing coil. Post embolization arteriography demonstrates complete occlusion of the vessel with stasis in the proximal segment. The Lantern microcatheter was brought back into the common hepatic artery and arteriography was performed. Again, complete occlusion of the gastroduodenal  artery. The right gastric artery is visualized. No evidence of active bleeding from the right gastric artery. The microcatheter was removed. The C2 cobra catheter was used to select the superior mesenteric artery. Arteriography was performed. Patent pancreaticoduodenal arcade supplying the gastroepiploic artery. No evidence of active hemorrhage. The C2 cobra catheter was removed. A limited right common femoral arteriogram was performed confirming common femoral arterial access. Hemostasis was attained with the assistance of a Cordis ExoSeal extra arterial vascular plug. IMPRESSION: 1. No evidence of active hemorrhage. 2. 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 3. Prophylactic coil embolization of the gastroduodenal artery. Signed, Criselda Peaches, MD, [place] [facility] Radiology Electronically Signed   By: Jacqulynn Cadet M.D.   On: 09/07/2018 22:32    Labs:  CBC: Recent Labs    09/06/18 0233 09/07/18 0349 09/07/18 1633 09/08/18 0255  WBC 8.5 11.6* 10.8* 11.3*  HGB 9.5* 5.9* 6.8* 8.7*  HCT 29.2* 18.9* 20.3* 25.9*  PLT 279 312 216 231    COAGS: Recent Labs    09/03/18 1325 09/07/18 1633  INR 1.03 1.28    BMP: Recent Labs    09/06/18 0233 09/07/18 0349 09/07/18 1800 09/08/18 0255  NA 137 136 139 140  K 3.8 3.9 4.1 3.9  CL 88* 96* 104 108  CO2 36* 30 28 25   GLUCOSE 242* 112* 231* 94  BUN 8 26* 39* 31*  CALCIUM 8.1* 7.2* 6.4* 7.2*  CREATININE 0.83 0.78 0.94 0.75  GFRNONAA >60 >60 >60 >60  GFRAA >60 >60 >60 >60    LIVER FUNCTION TESTS: Recent Labs    08/22/18 1932 08/24/18 0250 09/04/18 0724 09/07/18 1800  BILITOT 1.6* 0.5 0.5 0.6  AST 9* 17 31 26   ALT 17 15 16 15   ALKPHOS 87 51 48 24*  PROT 7.6 5.4* 4.9* 3.3*  ALBUMIN 3.7 2.8* 2.5* 1.4*    Assessment and Plan:  GDA embo 12/17 Stable Will follow few days Plan per CCM   Electronically Signed: Lavonia Drafts, PA-C 09/08/2018, 9:05 AM   I spent a total of 15 Minutes at the the patient's bedside AND on the patient's hospital floor or unit, greater than 50% of which was counseling/coordinating care for GDA Embo

## 2018-09-08 NOTE — Progress Notes (Signed)
Daily Rounding Note  09/08/2018, 8:32 AM  LOS: 5 days   SUBJECTIVE:   Chief complaint:  Bleeding duodenal bulb ulcer, esophagitis.  Blood loss anemia   No further emesis.  Stools are dark, not a lot of volume into rectal pouch.  Stable overnight, remains on phenylephrine, fentanyl, propofol, protonix.  Remains intubated, plan to extubate today.    OBJECTIVE:         Vital signs in last 24 hours:    Temp:  [97.5 F (36.4 C)-100.4 F (38 C)] 98.2 F (36.8 C) (12/18 0800) Pulse Rate:  [35-123] 77 (12/18 0717) Resp:  [12-21] 16 (12/18 0800) BP: (69-123)/(52-88) 97/75 (12/18 0800) SpO2:  [92 %-100 %] 97 % (12/18 0800) FiO2 (%):  [40 %-100 %] 40 % (12/18 0720) Last BM Date: 09/14/18 Filed Weights   09/03/18 1650  Weight: 66.7 kg   General: sedated on vent, opens his eyes randomly   Heart: RRR Chest: clear bil.   Abdomen: soft, NT, active BS.    Extremities: right LE wound vac Neuro/Psych:  Sedated, not following commands.    Intake/Output from previous day: 12/17 0701 - 12/18 0700 In: 8572.7 [I.V.:5873.8; Blood:2289; IV MGQQPYPPJ:093] Out: 2671 [IWPYK:9983; Emesis/NG output:280; Drains:200]  Intake/Output this shift: No intake/output data recorded.  Lab Results: Recent Labs    09/07/18 0349 09/07/18 1633 09/08/18 0255  WBC 11.6* 10.8* 11.3*  HGB 5.9* 6.8* 8.7*  HCT 18.9* 20.3* 25.9*  PLT 312 216 231   BMET Recent Labs    09/07/18 0349 09/07/18 1800 09/08/18 0255  NA 136 139 140  K 3.9 4.1 3.9  CL 96* 104 108  CO2 _0 GLUCOSE 112* 231* 94  BUN 26* 39* 31*  CREATININE 0.78 0.94 0.75  CALCIUM 7.2* 6.4* 7.2*   LFT Recent Labs    09/07/18 1800  PROT 3.3*  ALBUMIN 1.4*  AST 26  ALT 15  ALKPHOS 24*  BILITOT 0.6   PT/INR Recent Labs    09/07/18 1633  LABPROT 15.9*  INR 1.28    Studies/Results: Dg Abd 1 View  Result Date: 09/07/2018 CLINICAL DATA:  OG tube placement. EXAM:  ABDOMEN - 1 VIEW COMPARISON:  CT abdomen pelvis 08/22/2018. FINDINGS: OG tube is in place with the side port and tip in the body of the stomach. The stomach is distended. IMPRESSION: OG tube in good position. Electronically Signed   By: Inge Rise M.D.   On: 09/07/2018 13:31   Ct Angio Chest Pe W Or Wo Contrast  Result Date: 09/06/2018 CLINICAL DATA:  Evaluate thoracic aortic aneurysm. EXAM: CT ANGIOGRAPHY CHEST WITH CONTRAST TECHNIQUE: Multidetector CT imaging of the chest was performed using the standard protocol during bolus administration of intravenous contrast. Multiplanar CT image reconstructions and MIPs were obtained to evaluate the vascular anatomy. CONTRAST:  65 mL ISOVUE-370 IOPAMIDOL (ISOVUE-370) INJECTION 76% COMPARISON:  01/30/2018; 10/14/2016; 04/25/2026 FINDINGS: Vascular Findings: No evidence of thoracic aortic aneurysm with measurements as follows. No definite evidence of thoracic aortic dissection or periaortic stranding on this nongated examination. Bovine configuration of the aortic arch. The branch vessels of the aortic arch appear widely patent throughout their imaged courses. The descending thoracic aorta is of normal caliber and widely patent without hemodynamically significant stenosis. There is a very minimal amount of atherosclerotic plaque involving the proximal aspect of the descending thoracic aorta. Cardiomegaly.  No pericardial effusion. Although this examination was not tailored for the evaluation the pulmonary arteries,  there are no discrete filling defects within the central pulmonary arterial tree to suggest central pulmonary embolism. Borderline enlarged caliber the main pulmonary artery measuring 32 mm in diameter (image 67, series 6) ------------------------------------------------------------- Thoracic aortic measurements: Sinotubular junction 33 mm as measured in greatest oblique coronal dimension. Proximal ascending aorta 35 mm as measured in greatest oblique  axial dimension at the level of the main pulmonary artery (image 70, series 6); and approximately 36 mm in greatest oblique short axis coronal diameter (coronal image 48, series 9). Aortic arch aorta 28 mm as measured in greatest oblique sagittal dimension. Proximal descending thoracic aorta 30 mm as measured in greatest oblique axial dimension at the level of the main pulmonary artery. Distal descending thoracic aorta 29 mm as measured in greatest oblique axial dimension at the level of the diaphragmatic hiatus. Review of the MIP images confirms the above findings. ------------------------------------------------------------- Non-Vascular Findings: Mediastinum/Lymph Nodes: Prominent right hilar lymph nodes are not enlarged by size criteria with index right suprahilar lymph node measuring 0.8 cm in greatest short axis diameter (image 71, series 6) and index right infrahilar lymph node measuring 0.7 cm (image 88). No definitive bulky mediastinal, hilar or axillary lymphadenopathy. Lungs/Pleura: Interval development of small bilateral pleural effusions with worsening bibasilar consolidative opacities and associated air bronchograms, right greater than left. Interval development of an approximately 1.0 x 0.5 cm subpleural consolidative opacity within the right upper lobe (image 36, series 7). Unchanged punctate (approximately 3 mm) calcified granuloma within the left lower lobe (image 75, series 3). Evaluation for additional pulmonary nodules is degraded secondary to bibasilar airspace opacities. The central pulmonary airways appear widely patent. Upper abdomen: Fluid distention of the mid and distal aspects of the esophagus with marked distension of the imaged superior aspect of the stomach. Musculoskeletal: No acute or aggressive osseous abnormalities. There is incomplete fusion involving the spinous processes of multiple thoracic vertebral bodies, unchanged. Regional soft tissues appear normal. Normal appearance of  the thyroid gland. IMPRESSION: 1. No evidence of thoracic aortic aneurysm or dissection. 2. Small bilateral effusions with associated bibasilar consolidative opacities with associated air bronchograms, atelectasis versus infiltrate. Follow-up chest radiograph in 4-6 weeks after treatment is recommended to ensure resolution. 3. Fluid distension of the mid and distal aspects of the esophagus as well as marked distension of the imaged superior aspect stomach - correlation for gastric outlet obstructive symptoms is advised. 4. Cardiomegaly with enlargement of the caliber the main pulmonary artery as could be seen in the setting of pulmonary arterial hypertension. Electronically Signed   By: Sandi Mariscal M.D.   On: 09/06/2018 09:20   Ir Angiogram Visceral Selective Ir US Guide Vasc Access Right Ir [place] Hemorr Lymph [place] Guide Roadmapping  Result Date: 09/07/2018 INDICATION: 57 year old male with large volume upper GI bleed secondary to a very large duodenal ulcer. Patient underwent endoscopic intervention but continues to require pressors and large volume transfusion. He presents for mesenteric arteriography and embolization of any active bleeding and prophylactic embolization of the gastroduodenal artery. EXAM: IR ULTRASOUND GUIDANCE VASC ACCESS RIGHT; ADDITIONAL ARTERIOGRAPHY; IR EMBO ART VEN HEMORR LYMPH EXTRAV INC GUIDE ROADMAPPING; SELECTIVE VISCERAL ARTERIOGRAPHY 1. Ultrasound-guided vascular access 2. Catheterization of the left gastric artery where it arises directly from the aorta with arteriogram 3. Catheterization of the celiac artery with arteriogram 4. Catheterization of the common hepatic artery with arteriogram 5. Catheterization of the gastroduodenal artery with arteriogram 6. Coil embolization of the gastroduodenal artery 7. Catheterization of the superior mesenteric  artery with arteriogram MEDICATIONS: None ANESTHESIA/SEDATION: Patient intubated and on propofol drip CONTRAST:  70  mL Isovue 370 FLUOROSCOPY TIME:  Fluoroscopy Time: 11 minutes 30 seconds (487 mGy). COMPLICATIONS: None immediate. PROCEDURE: Informed consent was obtained from the patient following explanation of the procedure, risks, benefits and alternatives. The patient understands, agrees and consents for the procedure. All questions were addressed. A time out was performed prior to the initiation of the procedure. Maximal barrier sterile technique utilized including caps, mask, sterile gowns, sterile gloves, large sterile drape, hand hygiene, and Betadine prep. The right common femoral artery was interrogated with ultrasound and found to be widely patent. An image was obtained and stored for the medical record. Local anesthesia was attained by infiltration with 1% lidocaine. A small dermatotomy was made. Under real-time sonographic guidance, the vessel was punctured with a 21 gauge micropuncture needle. Using standard technique, the initial micro needle was exchanged over a 0.018 micro wire for a transitional 4 [place] micro sheath. The micro sheath was then exchanged over a 0.035 wire for a 5 French vascular sheath. A C2 cobra catheter was advanced in the abdominal aorta over a Bentson wire. The catheter was used to select the first vessel arising from the ventral aorta. Contrast was injected. This is a left gastric artery arising directly from the aorta. Anatomy is normal. No evidence of active bleeding. The Cobra catheter was then used to select the origin of the celiac artery. Arteriography was performed. The splenic artery is small in caliber. The hepatic artery demonstrates normal hepatic arterial anatomy. The gastroduodenal artery is slightly irregular with spasm in the proximal segment. No evidence of active extravasation. A glidewire was successfully advanced into the common hepatic artery and the C2 cobra catheter was advanced into the common hepatic artery. Additional arteriography was performed in multiple  obliquities. Again, the proximal gastroduodenal artery is irregular and spasmodic. No evidence of dissection, aneurysm or active bleeding. A Lantern microcatheter was then advanced over a Fathom 16 wire and used to select the distal aspect of the gastroduodenal artery. Arteriography was performed. The gastroepiploic arteries are widely patent. No evidence of active bleeding. Coil embolization was then performed using a 4 mm Ruby POD detachable micro coil followed by a 45 cm Ruby packing coil. Post embolization arteriography demonstrates complete occlusion of the vessel with stasis in the proximal segment. The Lantern microcatheter was brought back into the common hepatic artery and arteriography was performed. Again, complete occlusion of the gastroduodenal artery. The right gastric artery is visualized. No evidence of active bleeding from the right gastric artery. The microcatheter was removed. The C2 cobra catheter was used to select the superior mesenteric artery. Arteriography was performed. Patent pancreaticoduodenal arcade supplying the gastroepiploic artery. No evidence of active hemorrhage. The C2 cobra catheter was removed. A limited right common femoral arteriogram was performed confirming common femoral arterial access. Hemostasis was attained with the assistance of a Cordis ExoSeal extra arterial vascular plug. IMPRESSION: 1. No evidence of active hemorrhage. 2. 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 3. Prophylactic coil embolization of the gastroduodenal artery. Signed, Criselda Peaches, MD, [facility] Radiology Electronically Signed   By: Jacqulynn Cadet M.D.   On: 09/07/2018 22:32   Dg Chest Port 1 View  Result Date: 09/07/2018 CLINICAL DATA:  Status post fasciotomy and hematoma back UA shins in the right lower leg 2 days ago. Intubated patient. EXAM: PORTABLE CHEST 1 VIEW COMPARISON:  CT scan of the chest  of  September 06, 2018 FINDINGS: The lungs are well-expanded. There are patchy increased densities at both bases which are slightly more conspicuous today. There is a small right and and somewhat larger left pleural effusion. The heart and pulmonary vascularity are normal. The endotracheal tube tip projects 4.1 cm above the carina. The esophagogastric tube tip in proximal port project below the GE junction. The left internal jugular venous catheter tip projects over the midportion of the SVC. IMPRESSION: Bibasilar atelectasis or pneumonia. Small right pleural effusion and somewhat larger left pleural effusion. The support tubes are in reasonable position. Electronically Signed   By: David  Martinique M.D.   On: 09/07/2018 13:27   Scheduled Meds: . sodium chloride   Intravenous Once  . sodium chloride   Intravenous Once  . sodium chloride   Intravenous Once  . sodium chloride   Intravenous Once  . atorvastatin  20 mg Oral Daily  . chlorhexidine gluconate (MEDLINE KIT)  15 mL Mouth Rinse BID  . fentaNYL (SUBLIMAZE) injection  100 mcg Intravenous Once  . insulin aspart  0-9 Units Subcutaneous TID WC  . insulin detemir  20 Units Subcutaneous BID  . ipratropium-albuterol  3 mL Nebulization Q6H  . mouth rinse  15 mL Mouth Rinse 10 times per day   Continuous Infusions: . sodium chloride    . sodium chloride 100 mL/hr at 09/08/18 0838  . dextrose 5 % and 0.9% NaCl 50 mL/hr at 09/08/18 0700  . fentaNYL infusion INTRAVENOUS 120 mcg/hr (09/08/18 0700)  . magnesium sulfate 1 - 4 g bolus IVPB    . pantoprozole (PROTONIX) infusion 8 mg/hr (09/08/18 0600)  . phenylephrine (NEO-SYNEPHRINE) Adult infusion 160 mcg/min (09/08/18 0804)  . propofol (DIPRIVAN) infusion 45 mcg/kg/min (09/08/18 0700)   PRN Meds:.sodium chloride, fentaNYL, fentaNYL (SUBLIMAZE) injection, fentaNYL (SUBLIMAZE) injection, magnesium sulfate 1 - 4 g bolus IVPB, midazolam, midazolam, ondansetron **OR** ondansetron (ZOFRAN) IV, polyethylene glycol,  potassium chloride, senna-docusate     ASSESMENT:   *  Acute UGU hemorrhage in setting of now d/cd IV Heparin EGD 09/08/18: severe esophagitis, duodenal bulb ulcer and overlying clot treated with hemospray.   12/18 mesenteric angio with GDA embolization Remains on PPI drip day 1.5 of 3.    *   Blood loss anemia.  S/p PRBCs x 9, FFP x 1, platelets x 1.  Hgb improved.    *   S/p Right LE thrombolectomy and fasciotomy.     PLAN   *  CCM questions if pt needs NGT reinserted, has been out since EGD.   *   H Pylori Ab testing to be collected.       Ryan Medina  09/08/2018, 8:32 AM Phone [phone]

## 2018-09-08 NOTE — Progress Notes (Signed)
This note also relates to the following rows which could not be included: Temp - Cannot attach notes to unvalidated device data  RT NOTE: RT called by 3MW secretary to come to room 3M03 due to patient self-extubated. Upon arrival to room RT found patient sitting in bed on 4L [place] with sats of 96% and in no distress. MD had been notified and had already came and evaluated patient. Patient is alert and oriented and is able to speak. Vitals are stable. RT will continue to monitor.

## 2018-09-08 NOTE — Progress Notes (Signed)
eLink Physician-Brief Progress Note Patient Name: Ryan Medina DOB: 07/10/1961 MRN: [id number]   Date of Service  09/08/2018  HPI/Events of Note  Pt was given levemir earlier and now noted to be hypoglycemic. Pt is NPO.   eICU Interventions  Give 1/2 amp D50.  Start D5NS@50ml /hr.     Intervention Category Minor Interventions: Other:  Elsie Lincoln 09/08/2018, 4:20 AM

## 2018-09-08 NOTE — Care Management Note (Signed)
Case Management Note Marvetta Gibbons RN, BSN Transitions of Care Unit 4E- RN Case Manager [phone]  Patient Details  Name: Ryan Medina MRN: [id number] Date of Birth: [birth year]  Subjective/Objective:   Pt admitted with popliteal artery embolus, s/p popliteal and tibial artery embolectomy with subsequent evacuation and 4 compartment fasciotomy with wound VAC placement on 09/05/18 - plan to return to OR later this week for possible closure              Action/Plan: PTA pt lived at home, CM to follow for possible home wound VAC needs (would need [facility] wound VAC) and potential  HH needs for wound VAC drsg changes.   Expected Discharge Date:                  Expected Discharge Plan:  [place]  In-House Referral:     Discharge planning Services  CM Consult  Post Acute Care Choice:  Durable Medical Equipment Choice offered to:  Patient  DME Arranged:  Vac DME Agency:  [facility]  HH Arranged:    [place] Agency:     Status of Service:  In process, will continue to follow  If discussed at Long Length of Stay Meetings, dates discussed:    Additional Comments: 09/08/2018-1448-Alazia Crocket Estelle Grumbles, RN MSN, CCM-Pt self extubated. Remains off vent. Self pay-financial counselor following for evidence of long term disability (LTD).  09/07/18- 1500- Kristi Webster RN, CM- pt developed GIB, drop in hgb, and hemodynamic instability this AM, tx to 3MW for further medical tx- intubated per PCCM. CM will continue to follow for transition of care needs.   Bartholomew Crews, RN 09/08/2018, 2:48 PM

## 2018-09-08 NOTE — Progress Notes (Signed)
Patient transferred from 83M to room 12. Patient alert oriented x four. Patient on 4L [place]. No c/o pain at this time. Report received from [facility]. Will continue to monitor patient.

## 2018-09-08 NOTE — Procedures (Signed)
Extubation Procedure Note  Patient Details:   Name: Ryan Medina DOB: Feb 13, 1961 MRN: [id number]   Airway Documentation:  Airway 7.5 mm (Active)  Secured at (cm) 25 cm 09/08/2018  8:00 AM  Measured From Lips 09/08/2018  8:00 AM  Secured Location Left 09/08/2018  8:00 AM  Secured By [employer] 09/08/2018  8:00 AM  Tube Holder Repositioned Yes 09/08/2018  7:20 AM  Cuff Pressure (cm H2O) 28 cm H2O 09/08/2018  7:20 AM  Site Condition Dry 09/08/2018  8:00 AM   Vent end date: (not recorded) Vent end time: (not recorded)   Evaluation  O2 sats: stable throughout Complications: Complications of patient self-extubated Patient did tolerate procedure well. Bilateral Breath Sounds: Clear, Diminished   Yes   Patient self-extubated around 09:50. Patient is alert and oriented and is able to speak. Vitals are stable and sats are 100% on 4L [place]. MD has been notified. RT will continue to monitor.   Laurina Fischl Clyda Greener 09/08/2018, 9:59 AM

## 2018-09-09 ENCOUNTER — Inpatient Hospital Stay (HOSPITAL_COMMUNITY): Payer: Medicaid Other

## 2018-09-09 DIAGNOSIS — I714 Abdominal aortic aneurysm, without rupture: Secondary | ICD-10-CM

## 2018-09-09 LAB — GLUCOSE, CAPILLARY
Glucose-Capillary: 14 mg/dL — CL (ref 70–99)
Glucose-Capillary: 267 mg/dL — ABNORMAL HIGH (ref 70–99)
Glucose-Capillary: 264 mg/dL — ABNORMAL HIGH (ref 70–99)
Glucose-Capillary: 209 mg/dL — ABNORMAL HIGH (ref 70–99)
Glucose-Capillary: 158 mg/dL — ABNORMAL HIGH (ref 70–99)
Glucose-Capillary: 186 mg/dL — ABNORMAL HIGH (ref 70–99)

## 2018-09-09 LAB — CBC WITH DIFFERENTIAL/PLATELET
Abs Immature Granulocytes: 0 10*3/uL (ref 0.00–0.07)
BASOS PCT: 0 %
Band Neutrophils: 0 %
Basophils Absolute: 0 10*3/uL (ref 0.0–0.1)
Basophils Absolute: 0 10*3/uL (ref 0.0–0.1)
Basophils Relative: 0 %
Blasts: 0 %
Eosinophils Absolute: 0 10*3/uL (ref 0.0–0.5)
Eosinophils Absolute: 0.1 10*3/uL (ref 0.0–0.5)
Eosinophils Relative: 0 %
Eosinophils Relative: 1 %
HCT: 22.3 % — ABNORMAL LOW (ref 39.0–52.0)
HEMATOCRIT: 21.4 % — AB (ref 39.0–52.0)
Hemoglobin: 7 g/dL — ABNORMAL LOW (ref 13.0–17.0)
Hemoglobin: 7.3 g/dL — ABNORMAL LOW (ref 13.0–17.0)
Lymphocytes Relative: 6 %
Lymphocytes Relative: 4 %
Lymphs Abs: 0.8 10*3/uL (ref 0.7–4.0)
Lymphs Abs: 0.5 10*3/uL — ABNORMAL LOW (ref 0.7–4.0)
MCH: 27.5 pg (ref 26.0–34.0)
MCH: 27.8 pg (ref 26.0–34.0)
MCHC: 32.7 g/dL (ref 30.0–36.0)
MCHC: 32.7 g/dL (ref 30.0–36.0)
MCV: 83.9 fL (ref 80.0–100.0)
MCV: 84.8 fL (ref 80.0–100.0)
Metamyelocytes Relative: 0 %
Monocytes Absolute: 1.1 10*3/uL — ABNORMAL HIGH (ref 0.1–1.0)
Monocytes Absolute: 0.2 10*3/uL (ref 0.1–1.0)
Monocytes Relative: 8 %
Monocytes Relative: 2 %
Myelocytes: 0 %
NRBC: 0 /100{WBCs}
Neutro Abs: 11.5 10*3/uL — ABNORMAL HIGH (ref 1.7–7.7)
Neutro Abs: 11.5 10*3/uL — ABNORMAL HIGH (ref 1.7–7.7)
Neutrophils Relative %: 86 %
Neutrophils Relative %: 93 %
Other: 0 %
Platelets: 146 10*3/uL — ABNORMAL LOW (ref 150–400)
Platelets: 140 10*3/uL — ABNORMAL LOW (ref 150–400)
Promyelocytes Relative: 0 %
RBC: 2.55 MIL/uL — ABNORMAL LOW (ref 4.22–5.81)
RBC: 2.63 MIL/uL — AB (ref 4.22–5.81)
RDW: 14.9 % (ref 11.5–15.5)
RDW: 14.5 % (ref 11.5–15.5)
WBC Morphology: INCREASED
WBC: 13.4 10*3/uL — ABNORMAL HIGH (ref 4.0–10.5)
WBC: 12.4 10*3/uL — ABNORMAL HIGH (ref 4.0–10.5)
nRBC: 0 % (ref 0.0–0.2)
nRBC: 0 % (ref 0.0–0.2)
nRBC: 0 /100 WBC

## 2018-09-09 LAB — PREPARE RBC (CROSSMATCH)

## 2018-09-09 LAB — PHOSPHORUS: PHOSPHORUS: 2 mg/dL — AB (ref 2.5–4.6)

## 2018-09-09 LAB — COMPREHENSIVE METABOLIC PANEL
ALT: 19 U/L (ref 0–44)
AST: 32 U/L (ref 15–41)
Albumin: 1.3 g/dL — ABNORMAL LOW (ref 3.5–5.0)
Alkaline Phosphatase: 44 U/L (ref 38–126)
Anion gap: 6 (ref 5–15)
BUN: 15 mg/dL (ref 6–20)
CO2: 24 mmol/L (ref 22–32)
CREATININE: 0.6 mg/dL — AB (ref 0.61–1.24)
Calcium: 6.8 mg/dL — ABNORMAL LOW (ref 8.9–10.3)
Chloride: 103 mmol/L (ref 98–111)
GFR calc Af Amer: >60 mL/min (ref >60)
GFR calc non Af Amer: >60 mL/min (ref >60)
Glucose, Bld: 290 mg/dL — ABNORMAL HIGH (ref 70–99)
Potassium: 3.5 mmol/L (ref 3.5–5.1)
Sodium: 133 mmol/L — ABNORMAL LOW (ref 135–145)
Total Bilirubin: 0.7 mg/dL (ref 0.3–1.2)
Total Protein: 3.5 g/dL — ABNORMAL LOW (ref 6.5–8.1)

## 2018-09-09 LAB — MAGNESIUM: Magnesium: 1.8 mg/dL (ref 1.7–2.4)

## 2018-09-09 LAB — HEMOGLOBIN AND HEMATOCRIT, BLOOD
HCT: 21.7 % — ABNORMAL LOW (ref 39.0–52.0)
HCT: 27.1 % — ABNORMAL LOW (ref 39.0–52.0)
Hemoglobin: 7.2 g/dL — ABNORMAL LOW (ref 13.0–17.0)
Hemoglobin: 9.2 g/dL — ABNORMAL LOW (ref 13.0–17.0)

## 2018-09-09 LAB — H PYLORI, IGM, IGG, IGA AB
H PYLORI IGG: 1.49 {index_val} — AB (ref 0.00–0.79)
H. Pylogi, Iga Abs: 29.4 units — ABNORMAL HIGH (ref 0.0–8.9)
H. Pylogi, Igm Abs: <9 units (ref 0.0–8.9)

## 2018-09-09 MED ORDER — SODIUM CHLORIDE 0.9% IV SOLUTION
Freq: Once | INTRAVENOUS | Status: AC
  Administered 2018-09-09: 01:00:00 via INTRAVENOUS

## 2018-09-09 MED ORDER — CEFAZOLIN SODIUM-DEXTROSE 1-4 GM/50ML-% IV SOLN
1.0000 g | INTRAVENOUS | Status: AC
  Administered 2018-09-10: 2 g via INTRAVENOUS

## 2018-09-09 MED ORDER — METOCLOPRAMIDE HCL 5 MG/ML IJ SOLN
10.0000 mg | Freq: Once | INTRAMUSCULAR | Status: AC
  Administered 2018-09-09: 10 mg via INTRAVENOUS
  Filled 2018-09-09: qty 2

## 2018-09-09 MED ORDER — MAGNESIUM SULFATE 2 GM/50ML IV SOLN
2.0000 g | Freq: Once | INTRAVENOUS | Status: AC
  Administered 2018-09-09: 2 g via INTRAVENOUS
  Filled 2018-09-09: qty 50

## 2018-09-09 MED ORDER — SODIUM CHLORIDE 0.9% IV SOLUTION
Freq: Once | INTRAVENOUS | Status: AC
  Administered 2018-09-09: 10:00:00 via INTRAVENOUS

## 2018-09-09 MED ORDER — DIPHENHYDRAMINE HCL 50 MG/ML IJ SOLN
25.0000 mg | Freq: Once | INTRAMUSCULAR | Status: AC
  Administered 2018-09-09: 25 mg via INTRAVENOUS
  Filled 2018-09-09: qty 1

## 2018-09-09 MED ORDER — POTASSIUM PHOSPHATES 15 MMOLE/5ML IV SOLN
20.0000 mmol | Freq: Once | INTRAVENOUS | Status: AC
  Administered 2018-09-09: 20 mmol via INTRAVENOUS
  Filled 2018-09-09: qty 6.67

## 2018-09-09 MED ORDER — SODIUM CHLORIDE 0.9% FLUSH
10.0000 mL | INTRAVENOUS | Status: DC | PRN
  Administered 2018-09-11 – 2018-09-22 (×2): 10 mL
  Filled 2018-09-09 (×2): qty 40

## 2018-09-09 MED ORDER — IPRATROPIUM-ALBUTEROL 0.5-2.5 (3) MG/3ML IN SOLN
3.0000 mL | RESPIRATORY_TRACT | Status: DC | PRN
  Administered 2018-09-11 – 2018-11-07 (×5): 3 mL via RESPIRATORY_TRACT
  Filled 2018-09-09 (×5): qty 3

## 2018-09-09 MED ORDER — ACETAMINOPHEN 325 MG PO TABS
650.0000 mg | ORAL_TABLET | Freq: Once | ORAL | Status: AC
  Administered 2018-09-09: 650 mg via ORAL
  Filled 2018-09-09: qty 2

## 2018-09-09 MED ORDER — OXYCODONE-ACETAMINOPHEN 7.5-325 MG PO TABS
1.0000 | ORAL_TABLET | Freq: Four times a day (QID) | ORAL | Status: DC | PRN
  Administered 2018-09-09 – 2018-09-24 (×28): 1 via ORAL
  Filled 2018-09-09 (×32): qty 1

## 2018-09-09 MED ORDER — FUROSEMIDE 10 MG/ML IJ SOLN
20.0000 mg | Freq: Once | INTRAMUSCULAR | Status: AC
  Administered 2018-09-09: 20 mg via INTRAVENOUS
  Filled 2018-09-09: qty 2

## 2018-09-09 NOTE — Progress Notes (Signed)
   VASCULAR SURGERY ASSESSMENT & PLAN:   UPPER GI BLEED: No further bleeding.  He has undergone embolization of his gastroduodenal artery.  Hemoglobin is 7.2.  I suspect he will need further transfusion.  STATUS POST RIGHT POPLITEAL AND TIBIAL EMBOLECTOMY: Because of his significant GI bleed we are not able to anticoagulate him.  The source of embolization is not clear however he is at high risk for recurrent embolization without anticoagulation.  Currently he has excellent perfusion of the right foot.  He has a strong peroneal signal with the Doppler.  I cannot get a good posterior tibial signal at the ankle however he has a very brisk posterior tibial signal in the mid calf.  He has a monophasic dorsalis pedis and anterior tibial signal.  STATUS POST 4 COMPARTMENT FASCIOTOMY: I removed his VAC dressings this morning and he still has moderate swelling.  I think it would be worth trying to close his fasciotomies at least partially tomorrow.  Given he will go to the OR tomorrow I will just have a standard dressing placed and hold off on his VAC for now.  He will need to elevate his legs to help reduce swelling.  SUBJECTIVE:   Comfortable.  PHYSICAL EXAM:   Vitals:   09/08/18 2103 09/09/18 0108 09/09/18 0140 09/09/18 0409  BP: 135/78 125/74 113/70 117/69  Pulse: (!) 107 (!) 109 (!) 105 93  Resp: (!) 22 20 (!) 22 18  Temp:  (!) 100.4 F (38 C) 99.7 F (37.6 C) 99.8 F (37.7 C)  TempSrc:  Oral Oral Oral  SpO2: 100% 96% 98% 100%  Weight:      Height:       He has a brisk peroneal signal with the Doppler in the right foot.  On having a difficult time obtaining a posterior tibial signal at the ankle however he has a biphasic posterior tibial signal in the mid calf.  He has a monophasic dorsalis pedis signal. He has moderate swelling in his fasciotomy sites.  I removed his VAC.  LABS:   Lab Results  Component Value Date   WBC 13.4 (H) 09/08/2018   HGB 7.2 (L) 09/09/2018   HCT 21.7 (L)  09/09/2018   MCV 83.9 09/08/2018   PLT 146 (L) 09/08/2018   Lab Results  Component Value Date   CREATININE 0.69 09/08/2018   Lab Results  Component Value Date   INR 1.28 09/07/2018   CBG (last 3)  Recent Labs    09/08/18 2138 09/08/18 2159 09/09/18 0402  GLUCAP 61* 70 267*    PROBLEM LIST:    Principal Problem:   Popliteal artery embolus (HCC) Active Problems:   DM (diabetes mellitus), type 2, uncontrolled ([place])   Essential hypertension   Dilated cardiomyopathy (HCC)   Chest pain   AAA (abdominal aortic aneurysm) (HCC)   Ischemic foot   Right leg claudication (HCC)   PVD (peripheral vascular disease) ([place])   Hemorrhagic shock ([place])   CURRENT MEDS:   . sodium chloride   Intravenous Once  . sodium chloride   Intravenous Once  . sodium chloride   Intravenous Once  . sodium chloride   Intravenous Once  . atorvastatin  20 mg Oral Daily  . chlorhexidine gluconate (MEDLINE KIT)  15 mL Mouth Rinse BID  . insulin aspart  0-9 Units Subcutaneous TID WC  . mouth rinse  15 mL Mouth Rinse BID    [name] Beeper: [phone] Office: [phone] 09/09/2018

## 2018-09-09 NOTE — Progress Notes (Signed)
Inpatient Diabetes Program Recommendations  AACE/ADA: New Consensus Statement on Inpatient Glycemic Control (2019)  Target Ranges:  Prepandial:   less than 140 mg/dL      Peak postprandial:   less than 180 mg/dL (1-2 hours)      Critically ill patients:  140 - 180 mg/dL   Results for Ryan Medina, Ryan Medina (MRN [id number]) as of 09/09/2018 11:19  Ref. Range 09/09/2018 04:02 09/09/2018 08:58  Glucose-Capillary Latest Ref Range: 70 - 99 mg/dL 267 (H) 264 (H)   Results for Ryan Medina, Ryan Medina (MRN [id number]) as of 09/09/2018 11:19  Ref. Range 09/08/2018 07:11 09/08/2018 07:13 09/08/2018 08:19 09/08/2018 11:08 09/08/2018 12:08 09/08/2018 14:39 09/08/2018 17:41 09/08/2018 17:58 09/08/2018 18:22 09/08/2018 21:02 09/08/2018 21:38 09/08/2018 21:59  Glucose-Capillary Latest Ref Range: 70 - 99 mg/dL 14 (LL) 64 (L) 102 (H) 55 (L) 89 46 (L) 45 (L) 41 (LL) 106 (H) 69 (L) 61 (L) 70   Results for Ryan Medina, Ryan Medina (MRN [id number]) as of 09/09/2018 11:19  Ref. Range 01/30/2018 03:44 08/23/2018 03:01  Hemoglobin A1C Latest Ref Range: 4.8 - 5.6 % >15.5 (H) 18.2 (H)   Review of Glycemic Control  Diabetes history: DM2 Outpatient Diabetes medications: Levemir 30 units QAM, Levemir 20 units QPM, NOvolog 0-15 units TID with meals, Metformin 1000 mg BID Current orders for Inpatient glycemic control: Novolog 0-9 units TID with meals  Inpatient Diabetes Program Recommendations:   Insulin - Basal: Noted Levemir was discontinued due to hypoglycemia (last dose of Levemir 20 units was given on 09/07/18 at 23:50). Glucose up to 264 mg/dl this morning. If glucose continues to be greater than 180 mg/dl, please consider ordering Levemir 7 units Q24H (based on 66.7 kg x 0.1 units).  Correction (SSI): May want to consider changing CBGs and Novolog frequency to Q4H for closer monitoring and glycemic control.  A1C: A1C 18.2% on 08/23/18. Patient was seen by Inpatient Diabetes Coordinator on 08/23/18 during last  hospitalization.  Thanks, Barnie Alderman, RN, MSN, CDE Diabetes Coordinator Inpatient Diabetes Program [phone] (Team Pager from 8am to 5pm)

## 2018-09-09 NOTE — Progress Notes (Signed)
Daily Rounding Note  09/09/2018, 9:58 AM  LOS: 6 days   SUBJECTIVE:   Chief complaint: bulbar ulcer with GIB.  Blood loss anemia. Dark, liquid stools overnight. He received 1 unit of blood and Hgb went from 7.2 >> 7.3.  Nurse readying second unit of blood to be transfused this morning.  BUN has improved  OBJECTIVE:         Vital signs in last 24 hours:    Temp:  [99.5 F (37.5 C)-100.4 F (38 C)] 99.8 F (37.7 C) (12/19 0409) Pulse Rate:  [93-119] 93 (12/19 0409) Resp:  [18-24] 18 (12/19 0409) BP: (113-135)/(69-92) 117/69 (12/19 0409) SpO2:  [93 %-100 %] 100 % (12/19 0409) Last BM Date: 09/08/18 Filed Weights   09/03/18 1650  Weight: 66.7 kg   General: Lethargic but arousable. Heart: RRR.  NSR rate in the mid 80s on telemetry. Chest: Slightly diminished on the right side.  No adventitious sounds.  No dyspnea.  No cough. Abdomen: Soft.  Not tender or distended.  Active bowel sounds. Rectal: Rectal pouch is still in place.  The collection bag and tubing has liquid greenish/dark stool. Extremities: No CCE. Neuro/Psych: Oriented x3.  Follows commands.  Intake/Output from previous day: 12/18 0701 - 12/19 0700 In: 2830 [I.V.:2515; Blood:315] Out: 1925 [Urine:1375; Drains:550]  Intake/Output this shift: No intake/output data recorded.  Lab Results: Recent Labs    09/08/18 1454 09/08/18 2326 09/09/18 0437 09/09/18 0711  WBC 12.3* 13.4*  --  12.4*  HGB 7.9* 7.0* 7.2* 7.3*  HCT 24.5* 21.4* 21.7* 22.3*  PLT 165 146*  --  140*   BMET Recent Labs    09/08/18 0255 09/08/18 0500 09/09/18 0711  NA 140 141 133*  K 3.9 4.0 3.5  CL 108 110 103  CO2 25 25 24   GLUCOSE 94 94 290*  BUN 31* 32* 15  CREATININE 0.75 0.69 0.60*  CALCIUM 7.2* 7.3* 6.8*   LFT Recent Labs    09/07/18 1800 09/08/18 0500 09/09/18 0711  PROT 3.3*  --  3.5*  ALBUMIN 1.4* 1.5* 1.3*  AST 26  --  32  ALT 15  --  19  ALKPHOS 24*   --  44  BILITOT 0.6  --  0.7   PT/INR Recent Labs    09/07/18 1633  LABPROT 15.9*  INR 1.28   Hepatitis Panel No results for input(s): HEPBSAG, HCVAB, HEPAIGM, HEPBIGM in the last 72 hours.   Scheduled Meds: . sodium chloride   Intravenous Once  . sodium chloride   Intravenous Once  . sodium chloride   Intravenous Once  . sodium chloride   Intravenous Once  . sodium chloride   Intravenous Once  . acetaminophen  650 mg Oral Once  . atorvastatin  20 mg Oral Daily  . chlorhexidine gluconate (MEDLINE KIT)  15 mL Mouth Rinse BID  . furosemide  20 mg Intravenous Once  . insulin aspart  0-9 Units Subcutaneous TID WC  . mouth rinse  15 mL Mouth Rinse BID   Continuous Infusions: . sodium chloride    . [START ON 09/10/2018]  ceFAZolin (ANCEF) IV    . dextrose 5 % and 0.9% NaCl 75 mL/hr at 09/09/18 0742  . magnesium sulfate 1 - 4 g bolus IVPB    . pantoprozole (PROTONIX) infusion 8 mg/hr (09/09/18 0157)  . phenylephrine (NEO-SYNEPHRINE) Adult infusion Stopped (09/08/18 0901)   PRN Meds:.sodium chloride, ipratropium-albuterol, LORazepam, magnesium sulfate 1 - 4 g  bolus IVPB, ondansetron **OR** ondansetron (ZOFRAN) IV, oxyCODONE-acetaminophen, polyethylene glycol, potassium chloride, senna-docusate    ASSESMENT:   *  Acute UGU hemorrhage in setting of now d/cd IV Heparin EGD 09/08/18: severe esophagitis, duodenal bulb ulcer and overlying clot treated with hemospray.   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   Dark stools overnight.  Looks like old blood to me.  *   Blood loss anemia.  S/p PRBCs x 10, FFP x 1, platelets x 1.  Hgb improved.   Received additional 1 unit PRBCs (#10) overnight and will get 1 more this morning.  *   S/p Right LE thrombolectomy and fasciotomy.      PLAN   *   Continue on clear liquids and GI drip.  Agree with transfusing blood this morning.  CBC this afternoon and again in the morning. Serum H. pylori  antibodies in progress.    Azucena Freed  09/09/2018, 9:58 AM Phone [phone]

## 2018-09-09 NOTE — Progress Notes (Signed)
eLink Physician-Brief Progress Note Patient Name: Ryan Medina DOB: [birth year] MRN: [id number]   Date of Service  09/09/2018  HPI/Events of Note  Notified of hyperglycemia 276. Previous determinations persistently hypoglycemic. Patient reportedly just had 2 cups of jello.  eICU Interventions  Will hold off on insulin and continue monitoring qAC and HS     Intervention Category Major Interventions: Hyperglycemia - active titration of insulin therapy  Shona Needles Malaka Ruffner 09/09/2018, 4:21 AM

## 2018-09-09 NOTE — H&P (View-Only) (Signed)
   VASCULAR SURGERY ASSESSMENT & PLAN:   UPPER GI BLEED: No further bleeding.  He has undergone embolization of his gastroduodenal artery.  Hemoglobin is 7.2.  I suspect he will need further transfusion.  STATUS POST RIGHT POPLITEAL AND TIBIAL EMBOLECTOMY: Because of his significant GI bleed we are not able to anticoagulate him.  The source of embolization is not clear however he is at high risk for recurrent embolization without anticoagulation.  Currently he has excellent perfusion of the right foot.  He has a strong peroneal signal with the Doppler.  I cannot get a good posterior tibial signal at the ankle however he has a very brisk posterior tibial signal in the mid calf.  He has a monophasic dorsalis pedis and anterior tibial signal.  STATUS POST 4 COMPARTMENT FASCIOTOMY: I removed his VAC dressings this morning and he still has moderate swelling.  I think it would be worth trying to close his fasciotomies at least partially tomorrow.  Given he will go to the OR tomorrow I will just have a standard dressing placed and hold off on his VAC for now.  He will need to elevate his legs to help reduce swelling.  SUBJECTIVE:   Comfortable.  PHYSICAL EXAM:   Vitals:   09/08/18 2103 09/09/18 0108 09/09/18 0140 09/09/18 0409  BP: 135/78 125/74 113/70 117/69  Pulse: (!) 107 (!) 109 (!) 105 93  Resp: (!) 22 20 (!) 22 18  Temp:  (!) 100.4 F (38 C) 99.7 F (37.6 C) 99.8 F (37.7 C)  TempSrc:  Oral Oral Oral  SpO2: 100% 96% 98% 100%  Weight:      Height:       He has a brisk peroneal signal with the Doppler in the right foot.  On having a difficult time obtaining a posterior tibial signal at the ankle however he has a biphasic posterior tibial signal in the mid calf.  He has a monophasic dorsalis pedis signal. He has moderate swelling in his fasciotomy sites.  I removed his VAC.  LABS:   Lab Results  Component Value Date   WBC 13.4 (H) 09/08/2018   HGB 7.2 (L) 09/09/2018   HCT 21.7 (L)  09/09/2018   MCV 83.9 09/08/2018   PLT 146 (L) 09/08/2018   Lab Results  Component Value Date   CREATININE 0.69 09/08/2018   Lab Results  Component Value Date   INR 1.28 09/07/2018   CBG (last 3)  Recent Labs    09/08/18 2138 09/08/18 2159 09/09/18 0402  GLUCAP 61* 70 267*    PROBLEM LIST:    Principal Problem:   Popliteal artery embolus (HCC) Active Problems:   DM (diabetes mellitus), type 2, uncontrolled (HCC)   Essential hypertension   Dilated cardiomyopathy (HCC)   Chest pain   AAA (abdominal aortic aneurysm) (HCC)   Ischemic foot   Right leg claudication (HCC)   PVD (peripheral vascular disease) (HCC)   Hemorrhagic shock (HCC)   CURRENT MEDS:   . sodium chloride   Intravenous Once  . sodium chloride   Intravenous Once  . sodium chloride   Intravenous Once  . sodium chloride   Intravenous Once  . atorvastatin  20 mg Oral Daily  . chlorhexidine gluconate (MEDLINE KIT)  15 mL Mouth Rinse BID  . insulin aspart  0-9 Units Subcutaneous TID WC  . mouth rinse  15 mL Mouth Rinse BID    Christopher Dickson Beeper: [phone] Office: [phone] 09/09/2018  

## 2018-09-09 NOTE — Progress Notes (Signed)
Patient dressing to right leg changed with Hydrogel, Wet to dry dressing, Krelex, and ace bandage.

## 2018-09-09 NOTE — Progress Notes (Signed)
Pt IV PPI was disconnected before change of shift. Per eLink MD orders will restart IV PPI. Will continue to monitor pt.

## 2018-09-09 NOTE — Op Note (Addendum)
[facility] Patient Name: Ryan Medina Procedure Date : 09/07/2018 MRN: [id number] Attending MD: Justice Britain , MD Date of Birth: 01/25/1961 CSN: [id number] Age: 57 Admit Type: Inpatient Procedure:                Upper GI endoscopy Indications:              Coffee-ground emesis, Melena, Active                            gastrointestinal bleeding, Suspected upper                            gastrointestinal bleeding Providers:                Justice Britain, MD, Carollee Sires, Technician Referring MD:             Dr. Posey Rea (ICU) Medicines:                General Anesthesia (administered by ICU) Complications:            No immediate complications. Estimated Blood Loss:     Estimated blood loss: none. Estimated blood loss                            was minimal. Procedure:                Pre-Anesthesia Assessment:                           - Prior to the procedure, a History and Physical                            was performed, and patient medications and                            allergies were reviewed. The patient's tolerance of                            previous anesthesia was also reviewed. The risks                            and benefits of the procedure and the sedation                            options and risks were discussed with the patient.                            All questions were answered, and informed consent                            was obtained. Prior Anticoagulants: The patient has                            taken heparin, last dose was day  of procedure. ASA                            Grade Assessment: IV - A patient with severe                            systemic disease that is a constant threat to life.                            After reviewing the risks and benefits, the patient                            was deemed in satisfactory condition to undergo the                             procedure.                           After obtaining informed consent, the endoscope was                            passed under direct vision. Throughout the                            procedure, the patient's blood pressure, pulse, and                            oxygen saturations were monitored continuously. The                            [id number]-H190 ([id number] Olympus Adult EGD was introduced                            through the mouth, and advanced to the second part                            of duodenum. The upper GI endoscopy was technically                            difficult and complex. The patient tolerated the                            procedure. Scope In: Scope Out: Findings:      No gross lesions were noted in the proximal esophagus.      LA Grade D (one or more mucosal breaks involving at least 75% of       esophageal circumference) esophagitis with bleeding was found in the       middle and distal esophagus. 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      Clotted blood was found in the entire examined stomach. Lavage of the       area was performed using copious amounts, resulting in incomplete       clearance with fair visualization.  A J-shaped deformity was found in the gastric body.      There is no endoscopic evidence of ulceration in the entire examined       stomach.      One partially obstructing (about 60% obstructed) oozing cratered       duodenal ulcer with an adherent clot (Forrest Class IIb) was found in       the duodenal bulb. The lesion was at least 50 mm in largest dimension.       There is no evidence of perforation. Lavage of the area was performed       using copious amounts, resulting in incomplete clearance of the adherent       clot. I attempted multiple maneuvers to try and remove the clot. When       moving portions of the edge of the clot, oozing red blood was noted from        below. This was highly concerning for a visible vessel that had a clot       on it. However, with the overall stability of the patient, without his       family present, and the high risk, of significant bleeding if the entire       clot was removed, decision was made to stop attempt at removal. For       hemostasis, hemostatic spray was deployed to the duodenum. Two sprays       were applied the D1/D2 angle, Duodenal bulb, and Gastric Antrum. There       was no bleeding at the end of the procedure.      No other gross lesions were noted in the second portion of the duodenum. Impression:               - No gross lesions in proximal esophagus. LA Grade                            D esophagitis in middle and distal esophagus..                           - Clotted blood in the entire stomach. J-shaped                            deformity in the gastric body.                           - One oozing duodenal ulcer with an adherent clot                            (Forrest Class IIb). There is no evidence of                            perforation. Attempts at removal of the clot were                            not successful. Without family around, and his                            clinical stability, decision made to not remove the  clot because of concern for significant hemodynamic                            compromise. Hemospray was applied in effort to                            decrease the risk of persistent bleeding while                            awaiting next steps in evaluation/therapy as                            outlined below.                           - No gross lesions in the second portion of the                            duodenum. Recommendation:           - The patient will be observed post-procedure,                            until all discharge criteria are met.                           - Return care to ICU.                           -  Discussed case with ICU and with Interventional                            Radiology. Overall, high concern for risk of                            rebleeding, in setting of active oozing after just                            movement of small amount of clot at the base - thus                            consistent with still active bleeding. This is too                            large of an area for other endoscopic therapies                            other than use of Hemospray which was used today.                            Next step in evaluation should be a mesenteric                            angiogram and likely GDA embolization to decrease  risk as much as possible of severe hemorrhage again.                           - Do not place NGT for the next few days due to                            severe esophagitis.                           - Continue IV PPI drip.                           - Send H. pylori serology and treat if positive.                           - The findings and recommendations were discussed                            with the referring physician. Procedure Code(s):        --- Professional ---                           [phone], Esophagogastroduodenoscopy, flexible,                            transoral; with control of bleeding, any method Diagnosis Code(s):        --- Professional ---                           K20.9, Esophagitis, unspecified                           K92.2, Gastrointestinal hemorrhage, unspecified                           K31.89, Other diseases of stomach and duodenum                           K26.4, Chronic or unspecified duodenal ulcer with                            hemorrhage                           K92.0, Hematemesis                           K92.1, Melena (includes Hematochezia) CPT copyright 2018 [employer]. All rights reserved. The codes documented in this report are preliminary and upon coder  review may  be revised to meet current compliance requirements. Justice Britain, MD 09/09/2018 9:13:31 AM Number of Addenda: 0

## 2018-09-09 NOTE — Progress Notes (Signed)
BS 267. MD [place] paged. According to MD, will not treat with insulin due to pts hypolgycemic trend. Will continue to monitor.

## 2018-09-09 NOTE — Progress Notes (Signed)
eLink Physician-Brief Progress Note Patient Name: Ryan Medina DOB: 08/15/1961 MRN: [id number]   Date of Service  09/09/2018  HPI/Events of Note  Patient complaining of pain on leg s/p embolectomy. Reportedly with palpable pulses. Hbg 7 from 7.9 with no active bleeding. BP stable, mildly tachycardic.  eICU Interventions  Percocet ordered prn and instructed bedside nurse to check pulses regularly. Transfuse 1 more unit PRBC to improve perfusion as patient vasculopathic.     Intervention Category Major Interventions: Hemorrhage - evaluation and management Intermediate Interventions: Pain - evaluation and management  Judd Lien 09/09/2018, 12:35 AM

## 2018-09-09 NOTE — Progress Notes (Signed)
PROGRESS NOTE        PATIENT DETAILS Name: Ryan Medina Age: 57 y.o. Sex: male Date of Birth: [birth year] Admit Date: 09/03/2018 Admitting Physician [name], MD [id number], No Pcp Per  Brief Narrative: Patient is a 57 y.o. male with history of cocaine use, chronic systolic heart failure presented to the hospital on 12/13 with a right ischemic leg secondary to acute popliteal and tibial embolus, underwent embolectomy on 12/13 and subsequently was placed on a heparin drip.  He unfortunately developed a acute right lower leg hematoma with compartment syndrome requiring fasciotomy on 12/15.  Further hospital course was complicated by development of hemorrhagic shock with acute blood loss anemia secondary to a bleeding duodenal ulcer-requiring ICU transfer-intubation for airway protection and IR evaluation with mesenteric angiogram and GDA embolization.  See below for further details.  Subjective: Some black appearing stools in the rectal pouch.  He is otherwise stable.  Denies any abdominal pain or chest pain.  Assessment/Plan: Right ischemic leg secondary to popliteal artery/tibial artery embolism-s/p embolectomy on 96/29 complicated by right lower extremity hematoma with compartment syndrome requiring fasciotomy on 12/15: Source of embolus is not known but given history of chronic systolic heart failure-likely cardiac.  He has no prior history of VTE/arterial thrombosis.  Unable to anticoagulate currently due to severe GI bleeding-once he is more stable-may need to consult cardiology for a TEE.  If no embolic source is identified-may need to send out hypercoagulable panel as well.  Vascular surgery contemplating partial closure of his fasciotomy tomorrow.  Upper GI bleeding with hemorrhagic shock and acute blood loss anemia: Hemorrhagic shock has resolved-still having black-colored stools-but given clinical stability-this could be old blood making its way  down.  However his hemoglobin has not risen appropriately with 1 unit of transfusion that was given last night-suspect recalibration and may take 1-2 days for hemoglobin to stabilize and respond appropriately to PRBC transfusion.  Continue with IV PPI infusion.  Have ordered 1 unit of PRBC transfusion today-follow closely-if hemoglobin does not rise appropriately with transfusions or he develops clinical instability-may require second look endoscopy, general surgery involvement.  Acute hypoxic respiratory failure: Intubated when he developed hemorrhagic shock secondary to GI bleeding, successfully extubated-currently stable on 2 L of oxygen via nasal cannula.  Chronic systolic heart failure (EF 45-50% by TTE on 09/04/2018): Reasonably well compensated-follow weights, volume status, electrolytes closely.  Insulin-dependent DM-2: Last A1c on 12/2 was 18.2.  Did have some hypoglycemic episode yesterday-CBGs currently stable with SSI-since on a clear liquid diet-we will be n.p.o. post midnight for vascular procedure-better to allow some mild permissive hyperglycemia at this point.  Will follow-as he improves further-and diet is advanced-we can be more aggressive with diabetic control.  Cocaine use/tobacco use: Counseled-I am still not sure if he has any intention of quitting.  3 cm AAA: Stable for follow-up in the outpatient setting  DVT Prophylaxis: SCD's  Code Status: Full code   Family Communication: None at bedside  Disposition Plan: Remain inpatient  Antimicrobial agents: Anti-infectives (From admission, onward)   Start     Dose/Rate Route Frequency Ordered Stop   09/10/18 0800  ceFAZolin (ANCEF) IVPB 1 g/50 mL premix    Note to Pharmacy:  Send with pt to OR   1 g 100 mL/hr over 30 Minutes Intravenous On call 09/09/18 0756 09/11/18 0800   09/07/18  1300  erythromycin 250 mg in sodium chloride 0.9 % 100 mL IVPB     250 mg 100 mL/hr over 60 Minutes Intravenous Once 09/07/18 1125  09/07/18 2351   09/06/18 0000  ceFAZolin (ANCEF) IVPB 1 g/50 mL premix    Note to Pharmacy:  Send with pt to OR   1 g 100 mL/hr over 30 Minutes Intravenous On call 09/05/18 0849 09/05/18 0952   09/03/18 2245  ceFAZolin (ANCEF) IVPB 2g/100 mL premix     2 g 200 mL/hr over 30 Minutes Intravenous Every 8 hours 09/03/18 2236 09/04/18 1444   09/03/18 1615  ceFAZolin (ANCEF) IVPB 2g/100 mL premix  Status:  Discontinued     2 g 200 mL/hr over 30 Minutes Intravenous On call to O.R. 09/03/18 1600 09/03/18 2035      Procedures: 12/17>>Mesenteric angiogram and coil embolization of the GDA  12/17>>EGD  12/15>> 1.  Evacuation hematoma right leg 2.  Evacuation of lymphocele right leg 3.  4 compartment fasciotomy 4.  Placement of VAC (medial and lateral)  12/13>> 1.  Ultrasound-guided access to the left common femoral artery 2.  Aortogram with bilateral iliac arteriogram 3.  Selective catheterization of the right external iliac artery with right lower extremity runoff   CONSULTS:  pulmonary/intensive care, vascular surgery and IR  Time spent: 40 minutes-Greater than 50% of this time was spent in counseling, explanation of diagnosis, planning of further management, and coordination of care.  MEDICATIONS: Scheduled Meds: . sodium chloride   Intravenous Once  . sodium chloride   Intravenous Once  . sodium chloride   Intravenous Once  . sodium chloride   Intravenous Once  . atorvastatin  20 mg Oral Daily  . chlorhexidine gluconate (MEDLINE KIT)  15 mL Mouth Rinse BID  . furosemide  20 mg Intravenous Once  . insulin aspart  0-9 Units Subcutaneous TID WC  . mouth rinse  15 mL Mouth Rinse BID   Continuous Infusions: . sodium chloride    . [START ON 09/10/2018]  ceFAZolin (ANCEF) IV    . dextrose 5 % and 0.9% NaCl 75 mL/hr at 09/09/18 0742  . magnesium sulfate 1 - 4 g bolus IVPB    . pantoprozole (PROTONIX) infusion 8 mg/hr (09/09/18 1024)  . phenylephrine (NEO-SYNEPHRINE) Adult  infusion Stopped (09/08/18 0901)   PRN Meds:.sodium chloride, ipratropium-albuterol, LORazepam, magnesium sulfate 1 - 4 g bolus IVPB, ondansetron **OR** ondansetron (ZOFRAN) IV, oxyCODONE-acetaminophen, polyethylene glycol, potassium chloride, senna-docusate   PHYSICAL EXAM: Vital signs: Vitals:   09/09/18 0140 09/09/18 0409 09/09/18 1000 09/09/18 1042  BP: 113/70 117/69 120/87 114/82  Pulse: (!) 105 93 92 88  Resp: (!) 22 18 18 16   Temp: 99.7 F (37.6 C) 99.8 F (37.7 C) 98.6 F (37 C) 98.1 F (36.7 C)  TempSrc: Oral Oral Oral Oral  SpO2: 98% 100% 100% 100%  Weight:      Height:       Filed Weights   09/03/18 1650  Weight: 66.7 kg   Body mass index is 20.51 kg/m.   General appearance :Awake, alert, not in any distress.  HEENT: Atraumatic and Normocephalic Neck: supple Resp:Good air entry bilaterally, no added sounds  CVS: S1 S2 regular GI: Bowel sounds present, Non tender and not distended with no gaurding, rigidity or rebound.No organomegaly Extremities: B/L Lower Ext shows no edema Neurology:  speech clear,Non focal, sensation is grossly intact. Psychiatric: Normal judgment and insight. Alert and oriented x 3. Normal mood. Musculoskeletal:No digital cyanosis Skin:No Rash, warm  and dry Wounds:N/A  I have personally reviewed following labs and imaging studies  LABORATORY DATA: CBC: Recent Labs  Lab 09/07/18 1633 09/08/18 0255 09/08/18 1454 09/08/18 2326 09/09/18 0437 09/09/18 0711  WBC 10.8* 11.3* 12.3* 13.4*  --  12.4*  NEUTROABS 10.4* 9.5* 11.2* 11.5*  --  11.5*  HGB 6.8* 8.7* 7.9* 7.0* 7.2* 7.3*  HCT 20.3* 25.9* 24.5* 21.4* 21.7* 22.3*  MCV 84.6 85.8 85.4 83.9  --  84.8  PLT 216 231 165 146*  --  140*    Basic Metabolic Panel: Recent Labs  Lab 09/05/18 0231  09/06/18 0233 09/07/18 0349 09/07/18 1800 09/08/18 0255 09/08/18 0500 09/09/18 0437 09/09/18 0711  NA 131*   < > 137 136 139 140 141  --  133*  K 3.4*   < > 3.8 3.9 4.1 3.9 4.0  --   3.5  CL 86*  --  88* 96* 104 108 110  --  103  CO2 30  --  36* 30 28 25 25   --  24  GLUCOSE 196*   < > 242* 112* 231* 94 94  --  290*  BUN 16  --  8 26* 39* 31* 32*  --  15  CREATININE 0.78  --  0.83 0.78 0.94 0.75 0.69  --  0.60*  CALCIUM 7.3*  --  8.1* 7.2* 6.4* 7.2* 7.3*  --  6.8*  MG 2.0  --  2.1 2.0  --   --  2.1 1.8  --   PHOS  --   --   --   --   --   --  2.8 2.0*  --    < > = values in this interval not displayed.    GFR: Estimated Creatinine Clearance: 96.1 mL/min (A) (by C-G formula based on SCr of 0.6 mg/dL (L)).  Liver Function Tests: Recent Labs  Lab 09/04/18 0724 09/07/18 1800 09/08/18 0500 09/09/18 0711  AST 31 26  --  32  ALT 16 15  --  19  ALKPHOS 48 24*  --  44  BILITOT 0.5 0.6  --  0.7  PROT 4.9* 3.3*  --  3.5*  ALBUMIN 2.5* 1.4* 1.5* 1.3*   No results for input(s): LIPASE, AMYLASE in the last 168 hours. Recent Labs  Lab 09/07/18 1630  AMMONIA 25    Coagulation Profile: Recent Labs  Lab 09/03/18 1325 09/07/18 1633  INR 1.03 1.28    Cardiac Enzymes: Recent Labs  Lab 09/03/18 2258 09/04/18 0724 09/04/18 1025  TROPONINI <0.03 <0.03 <0.03    BNP (last 3 results) No results for input(s): PROBNP in the last 8760 hours.  HbA1C: No results for input(s): HGBA1C in the last 72 hours.  CBG: Recent Labs  Lab 09/08/18 2102 09/08/18 2138 09/08/18 2159 09/09/18 0402 09/09/18 0858  GLUCAP 69* 61* 70 267* 264*    Lipid Profile: Recent Labs    09/07/18 1800  TRIG 107    Thyroid Function Tests: No results for input(s): TSH, T4TOTAL, FREET4, T3FREE, THYROIDAB in the last 72 hours.  Anemia Panel: No results for input(s): VITAMINB12, FOLATE, FERRITIN, TIBC, IRON, RETICCTPCT in the last 72 hours.  Urine analysis:    Component Value Date/Time   COLORURINE STRAW (A) 08/22/2018 2054   APPEARANCEUR CLEAR 08/22/2018 2054   LABSPEC 1.027 08/22/2018 2054   PHURINE 6.0 08/22/2018 2054   GLUCOSEU >=500 (A) 08/22/2018 2054   HGBUR NEGATIVE  08/22/2018 2054   BILIRUBINUR NEGATIVE 08/22/2018 2054   KETONESUR 80 (A)  08/22/2018 2054   PROTEINUR NEGATIVE 08/22/2018 2054   UROBILINOGEN 4.0 (H) 12/05/2016 0921   NITRITE NEGATIVE 08/22/2018 2054   LEUKOCYTESUR NEGATIVE 08/22/2018 2054    Sepsis Labs: Lactic Acid, Venous    Component Value Date/Time   LATICACIDVEN 1.7 09/07/2018 1600    MICROBIOLOGY: No results found for this or any previous visit (from the past 240 hour(s)).  RADIOLOGY STUDIES/RESULTS: Dg Abd 1 View  Result Date: 09/07/2018 CLINICAL DATA:  OG tube placement. EXAM: ABDOMEN - 1 VIEW COMPARISON:  CT abdomen pelvis 08/22/2018. FINDINGS: OG tube is in place with the side port and tip in the body of the stomach. The stomach is distended. IMPRESSION: OG tube in good position. Electronically Signed   By: Inge Rise M.D.   On: 09/07/2018 13:31   Ct Angio Chest Pe W Or Wo Contrast  Result Date: 09/06/2018 CLINICAL DATA:  Evaluate thoracic aortic aneurysm. EXAM: CT ANGIOGRAPHY CHEST WITH CONTRAST TECHNIQUE: Multidetector CT imaging of the chest was performed using the standard protocol during bolus administration of intravenous contrast. Multiplanar CT image reconstructions and MIPs were obtained to evaluate the vascular anatomy. CONTRAST:  65 mL ISOVUE-370 IOPAMIDOL (ISOVUE-370) INJECTION 76% COMPARISON:  01/30/2018; 10/14/2016; 04/25/2026 FINDINGS: Vascular Findings: No evidence of thoracic aortic aneurysm with measurements as follows. No definite evidence of thoracic aortic dissection or periaortic stranding on this nongated examination. Bovine configuration of the aortic arch. The branch vessels of the aortic arch appear widely patent throughout their imaged courses. The descending thoracic aorta is of normal caliber and widely patent without hemodynamically significant stenosis. There is a very minimal amount of atherosclerotic plaque involving the proximal aspect of the descending thoracic aorta. Cardiomegaly.  No  pericardial effusion. Although this examination was not tailored for the evaluation the pulmonary arteries, there are no discrete filling defects within the central pulmonary arterial tree to suggest central pulmonary embolism. Borderline enlarged caliber the main pulmonary artery measuring 32 mm in diameter (image 67, series 6) ------------------------------------------------------------- Thoracic aortic measurements: Sinotubular junction 33 mm as measured in greatest oblique coronal dimension. Proximal ascending aorta 35 mm as measured in greatest oblique axial dimension at the level of the main pulmonary artery (image 70, series 6); and approximately 36 mm in greatest oblique short axis coronal diameter (coronal image 48, series 9). Aortic arch aorta 28 mm as measured in greatest oblique sagittal dimension. Proximal descending thoracic aorta 30 mm as measured in greatest oblique axial dimension at the level of the main pulmonary artery. Distal descending thoracic aorta 29 mm as measured in greatest oblique axial dimension at the level of the diaphragmatic hiatus. Review of the MIP images confirms the above findings. ------------------------------------------------------------- Non-Vascular Findings: Mediastinum/Lymph Nodes: Prominent right hilar lymph nodes are not enlarged by size criteria with index right suprahilar lymph node measuring 0.8 cm in greatest short axis diameter (image 71, series 6) and index right infrahilar lymph node measuring 0.7 cm (image 88). No definitive bulky mediastinal, hilar or axillary lymphadenopathy. Lungs/Pleura: Interval development of small bilateral pleural effusions with worsening bibasilar consolidative opacities and associated air bronchograms, right greater than left. Interval development of an approximately 1.0 x 0.5 cm subpleural consolidative opacity within the right upper lobe (image 36, series 7). Unchanged punctate (approximately 3 mm) calcified granuloma within the  left lower lobe (image 75, series 3). Evaluation for additional pulmonary nodules is degraded secondary to bibasilar airspace opacities. The central pulmonary airways appear widely patent. Upper abdomen: Fluid distention of the mid and distal aspects of the esophagus  with marked distension of the imaged superior aspect of the stomach. Musculoskeletal: No acute or aggressive osseous abnormalities. There is incomplete fusion involving the spinous processes of multiple thoracic vertebral bodies, unchanged. Regional soft tissues appear normal. Normal appearance of the thyroid gland. IMPRESSION: 1. No evidence of thoracic aortic aneurysm or dissection. 2. Small bilateral effusions with associated bibasilar consolidative opacities with associated air bronchograms, atelectasis versus infiltrate. Follow-up chest radiograph in 4-6 weeks after treatment is recommended to ensure resolution. 3. Fluid distension of the mid and distal aspects of the esophagus as well as marked distension of the imaged superior aspect stomach - correlation for gastric outlet obstructive symptoms is advised. 4. Cardiomegaly with enlargement of the caliber the main pulmonary artery as could be seen in the setting of pulmonary arterial hypertension. Electronically Signed   By: Sandi Mariscal M.D.   On: 09/06/2018 09:20   Ir Angiogram Visceral Selective  Result Date: 09/07/2018 INDICATION: 57 year old male with large volume upper GI bleed secondary to a very large duodenal ulcer. Patient underwent endoscopic intervention but continues to require pressors and large volume transfusion. He presents for mesenteric arteriography and embolization of any active bleeding and prophylactic embolization of the gastroduodenal artery. EXAM: IR ULTRASOUND GUIDANCE VASC ACCESS RIGHT; ADDITIONAL ARTERIOGRAPHY; IR EMBO ART VEN HEMORR LYMPH EXTRAV INC GUIDE ROADMAPPING; SELECTIVE VISCERAL ARTERIOGRAPHY 1. Ultrasound-guided vascular access 2. Catheterization of the  left gastric artery where it arises directly from the aorta with arteriogram 3. Catheterization of the celiac artery with arteriogram 4. Catheterization of the common hepatic artery with arteriogram 5. Catheterization of the gastroduodenal artery with arteriogram 6. Coil embolization of the gastroduodenal artery 7. Catheterization of the superior mesenteric artery with arteriogram MEDICATIONS: None ANESTHESIA/SEDATION: Patient intubated and on propofol drip CONTRAST:  70 mL Isovue 370 FLUOROSCOPY TIME:  Fluoroscopy Time: 11 minutes 30 seconds (487 mGy). COMPLICATIONS: None immediate. PROCEDURE: Informed consent was obtained from the patient following explanation of the procedure, risks, benefits and alternatives. The patient understands, agrees and consents for the procedure. All questions were addressed. A time out was performed prior to the initiation of the procedure. Maximal barrier sterile technique utilized including caps, mask, sterile gowns, sterile gloves, large sterile drape, hand hygiene, and Betadine prep. The right common femoral artery was interrogated with ultrasound and found to be widely patent. An image was obtained and stored for the medical record. Local anesthesia was attained by infiltration with 1% lidocaine. A small dermatotomy was made. Under real-time sonographic guidance, the vessel was punctured with a 21 gauge micropuncture needle. Using standard technique, the initial micro needle was exchanged over a 0.018 micro wire for a transitional 4 [place] micro sheath. The micro sheath was then exchanged over a 0.035 wire for a 5 French vascular sheath. A C2 cobra catheter was advanced in the abdominal aorta over a Bentson wire. The catheter was used to select the first vessel arising from the ventral aorta. Contrast was injected. This is a left gastric artery arising directly from the aorta. Anatomy is normal. No evidence of active bleeding. The Cobra catheter was then used to select the origin  of the celiac artery. Arteriography was performed. The splenic artery is small in caliber. The hepatic artery demonstrates normal hepatic arterial anatomy. The gastroduodenal artery is slightly irregular with spasm in the proximal segment. No evidence of active extravasation. A glidewire was successfully advanced into the common hepatic artery and the C2 cobra catheter was advanced into the common hepatic artery. Additional arteriography was performed in multiple  obliquities. Again, the proximal gastroduodenal artery is irregular and spasmodic. No evidence of dissection, aneurysm or active bleeding. A Lantern microcatheter was then advanced over a Fathom 16 wire and used to select the distal aspect of the gastroduodenal artery. Arteriography was performed. The gastroepiploic arteries are widely patent. No evidence of active bleeding. Coil embolization was then performed using a 4 mm Ruby POD detachable micro coil followed by a 45 cm Ruby packing coil. Post embolization arteriography demonstrates complete occlusion of the vessel with stasis in the proximal segment. The Lantern microcatheter was brought back into the common hepatic artery and arteriography was performed. Again, complete occlusion of the gastroduodenal artery. The right gastric artery is visualized. No evidence of active bleeding from the right gastric artery. The microcatheter was removed. The C2 cobra catheter was used to select the superior mesenteric artery. Arteriography was performed. Patent pancreaticoduodenal arcade supplying the gastroepiploic artery. No evidence of active hemorrhage. The C2 cobra catheter was removed. A limited right common femoral arteriogram was performed confirming common femoral arterial access. Hemostasis was attained with the assistance of a Cordis ExoSeal extra arterial vascular plug. IMPRESSION: 1. No evidence of active hemorrhage. 2. 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 3. Prophylactic  coil embolization of the gastroduodenal artery. Signed, Criselda Peaches, MD, [facility] Electronically Signed   By: Jacqulynn Cadet M.D.   On: 09/07/2018 22:32   Ir Angiogram Visceral Selective  Result Date: 09/07/2018 INDICATION: 57 year old male with large volume upper GI bleed secondary to a very large duodenal ulcer. Patient underwent endoscopic intervention but continues to require pressors and large volume transfusion. He presents for mesenteric arteriography and embolization of any active bleeding and prophylactic embolization of the gastroduodenal artery. EXAM: IR ULTRASOUND GUIDANCE VASC ACCESS RIGHT; ADDITIONAL ARTERIOGRAPHY; IR EMBO ART VEN HEMORR LYMPH EXTRAV INC GUIDE ROADMAPPING; SELECTIVE VISCERAL ARTERIOGRAPHY 1. Ultrasound-guided vascular access 2. Catheterization of the left gastric artery where it arises directly from the aorta with arteriogram 3. Catheterization of the celiac artery with arteriogram 4. Catheterization of the common hepatic artery with arteriogram 5. Catheterization of the gastroduodenal artery with arteriogram 6. Coil embolization of the gastroduodenal artery 7. Catheterization of the superior mesenteric artery with arteriogram MEDICATIONS: None ANESTHESIA/SEDATION: Patient intubated and on propofol drip CONTRAST:  70 mL Isovue 370 FLUOROSCOPY TIME:  Fluoroscopy Time: 11 minutes 30 seconds (487 mGy). COMPLICATIONS: None immediate. PROCEDURE: Informed consent was obtained from the patient following explanation of the procedure, risks, benefits and alternatives. The patient understands, agrees and consents for the procedure. All questions were addressed. A time out was performed prior to the initiation of the procedure. Maximal barrier sterile technique utilized including caps, mask, sterile gowns, sterile gloves, large sterile drape, hand hygiene, and Betadine prep. The right common femoral artery was  interrogated with ultrasound and found to be widely patent. An image was obtained and stored for the medical record. Local anesthesia was attained by infiltration with 1% lidocaine. A small dermatotomy was made. Under real-time sonographic guidance, the vessel was punctured with a 21 gauge micropuncture needle. Using standard technique, the initial micro needle was exchanged over a 0.018 micro wire for a transitional 4 [place] micro sheath. The micro sheath was then exchanged over a 0.035 wire for a 5 French vascular sheath. A C2 cobra catheter was advanced in the abdominal aorta over a Bentson wire. The catheter was used to select the first vessel arising from the ventral aorta. Contrast was injected. This is a  left gastric artery arising directly from the aorta. Anatomy is normal. No evidence of active bleeding. The Cobra catheter was then used to select the origin of the celiac artery. Arteriography was performed. The splenic artery is small in caliber. The hepatic artery demonstrates normal hepatic arterial anatomy. The gastroduodenal artery is slightly irregular with spasm in the proximal segment. No evidence of active extravasation. A glidewire was successfully advanced into the common hepatic artery and the C2 cobra catheter was advanced into the common hepatic artery. Additional arteriography was performed in multiple obliquities. Again, the proximal gastroduodenal artery is irregular and spasmodic. No evidence of dissection, aneurysm or active bleeding. A Lantern microcatheter was then advanced over a Fathom 16 wire and used to select the distal aspect of the gastroduodenal artery. Arteriography was performed. The gastroepiploic arteries are widely patent. No evidence of active bleeding. Coil embolization was then performed using a 4 mm Ruby POD detachable micro coil followed by a 45 cm Ruby packing coil. Post embolization arteriography demonstrates complete occlusion of the vessel with stasis in the  proximal segment. The Lantern microcatheter was brought back into the common hepatic artery and arteriography was performed. Again, complete occlusion of the gastroduodenal artery. The right gastric artery is visualized. No evidence of active bleeding from the right gastric artery. The microcatheter was removed. The C2 cobra catheter was used to select the superior mesenteric artery. Arteriography was performed. Patent pancreaticoduodenal arcade supplying the gastroepiploic artery. No evidence of active hemorrhage. The C2 cobra catheter was removed. A limited right common femoral arteriogram was performed confirming common femoral arterial access. Hemostasis was attained with the assistance of a Cordis ExoSeal extra arterial vascular plug. IMPRESSION: 1. No evidence of active hemorrhage. 2. 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 3. Prophylactic coil embolization of the gastroduodenal artery. Signed, Criselda Peaches, MD, [place] [facility] Radiology Electronically Signed   By: Jacqulynn Cadet M.D.   On: 09/07/2018 22:32   Ir Angiogram Selective Each Additional Vessel  Result Date: 09/07/2018 INDICATION: 57 year old male with large volume upper GI bleed secondary to a very large duodenal ulcer. Patient underwent endoscopic intervention but continues to require pressors and large volume transfusion. He presents for mesenteric arteriography and embolization of any active bleeding and prophylactic embolization of the gastroduodenal artery. EXAM: IR ULTRASOUND GUIDANCE VASC ACCESS RIGHT; ADDITIONAL ARTERIOGRAPHY; IR EMBO ART VEN HEMORR LYMPH EXTRAV INC GUIDE ROADMAPPING; SELECTIVE VISCERAL ARTERIOGRAPHY 1. Ultrasound-guided vascular access 2. Catheterization of the left gastric artery where it arises directly from the aorta with arteriogram 3. Catheterization of the celiac artery with arteriogram 4. Catheterization of the common hepatic  artery with arteriogram 5. Catheterization of the gastroduodenal artery with arteriogram 6. Coil embolization of the gastroduodenal artery 7. Catheterization of the superior mesenteric artery with arteriogram MEDICATIONS: None ANESTHESIA/SEDATION: Patient intubated and on propofol drip CONTRAST:  70 mL Isovue 370 FLUOROSCOPY TIME:  Fluoroscopy Time: 11 minutes 30 seconds (487 mGy). COMPLICATIONS: None immediate. PROCEDURE: Informed consent was obtained from the patient following explanation of the procedure, risks, benefits and alternatives. The patient understands, agrees and consents for the procedure. All questions were addressed. A time out was performed prior to the initiation of the procedure. Maximal barrier sterile technique utilized including caps, mask, sterile gowns, sterile gloves, large sterile drape, hand hygiene, and Betadine prep. The right common femoral artery was interrogated with ultrasound and found to be widely patent. An image was obtained and stored for the medical record. Local anesthesia was attained  by infiltration with 1% lidocaine. A small dermatotomy was made. Under real-time sonographic guidance, the vessel was punctured with a 21 gauge micropuncture needle. Using standard technique, the initial micro needle was exchanged over a 0.018 micro wire for a transitional 4 [place] micro sheath. The micro sheath was then exchanged over a 0.035 wire for a 5 French vascular sheath. A C2 cobra catheter was advanced in the abdominal aorta over a Bentson wire. The catheter was used to select the first vessel arising from the ventral aorta. Contrast was injected. This is a left gastric artery arising directly from the aorta. Anatomy is normal. No evidence of active bleeding. The Cobra catheter was then used to select the origin of the celiac artery. Arteriography was performed. The splenic artery is small in caliber. The hepatic artery demonstrates normal hepatic arterial anatomy. The gastroduodenal  artery is slightly irregular with spasm in the proximal segment. No evidence of active extravasation. A glidewire was successfully advanced into the common hepatic artery and the C2 cobra catheter was advanced into the common hepatic artery. Additional arteriography was performed in multiple obliquities. Again, the proximal gastroduodenal artery is irregular and spasmodic. No evidence of dissection, aneurysm or active bleeding. A Lantern microcatheter was then advanced over a Fathom 16 wire and used to select the distal aspect of the gastroduodenal artery. Arteriography was performed. The gastroepiploic arteries are widely patent. No evidence of active bleeding. Coil embolization was then performed using a 4 mm Ruby POD detachable micro coil followed by a 45 cm Ruby packing coil. Post embolization arteriography demonstrates complete occlusion of the vessel with stasis in the proximal segment. The Lantern microcatheter was brought back into the common hepatic artery and arteriography was performed. Again, complete occlusion of the gastroduodenal artery. The right gastric artery is visualized. No evidence of active bleeding from the right gastric artery. The microcatheter was removed. The C2 cobra catheter was used to select the superior mesenteric artery. Arteriography was performed. Patent pancreaticoduodenal arcade supplying the gastroepiploic artery. No evidence of active hemorrhage. The C2 cobra catheter was removed. A limited right common femoral arteriogram was performed confirming common femoral arterial access. Hemostasis was attained with the assistance of a Cordis ExoSeal extra arterial vascular plug. IMPRESSION: 1. No evidence of active hemorrhage. 2. 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 3. Prophylactic coil embolization of the gastroduodenal artery. Signed, Criselda Peaches, MD, [place] [facility] Radiology Electronically Signed    By: Jacqulynn Cadet M.D.   On: 09/07/2018 22:32   Ir Angiogram Selective Each Additional Vessel  Result Date: 09/07/2018 INDICATION: 57 year old male with large volume upper GI bleed secondary to a very large duodenal ulcer. Patient underwent endoscopic intervention but continues to require pressors and large volume transfusion. He presents for mesenteric arteriography and embolization of any active bleeding and prophylactic embolization of the gastroduodenal artery. EXAM: IR ULTRASOUND GUIDANCE VASC ACCESS RIGHT; ADDITIONAL ARTERIOGRAPHY; IR EMBO ART VEN HEMORR LYMPH EXTRAV INC GUIDE ROADMAPPING; SELECTIVE VISCERAL ARTERIOGRAPHY 1. Ultrasound-guided vascular access 2. Catheterization of the left gastric artery where it arises directly from the aorta with arteriogram 3. Catheterization of the celiac artery with arteriogram 4. Catheterization of the common hepatic artery with arteriogram 5. Catheterization of the gastroduodenal artery with arteriogram 6. Coil embolization of the gastroduodenal artery 7. Catheterization of the superior mesenteric artery with arteriogram MEDICATIONS: None ANESTHESIA/SEDATION: Patient intubated and on propofol drip CONTRAST:  70 mL Isovue 370 FLUOROSCOPY TIME:  Fluoroscopy Time: 11 minutes 30 seconds (487  mGy). COMPLICATIONS: None immediate. PROCEDURE: Informed consent was obtained from the patient following explanation of the procedure, risks, benefits and alternatives. The patient understands, agrees and consents for the procedure. All questions were addressed. A time out was performed prior to the initiation of the procedure. Maximal barrier sterile technique utilized including caps, mask, sterile gowns, sterile gloves, large sterile drape, hand hygiene, and Betadine prep. The right common femoral artery was interrogated with ultrasound and found to be widely patent. An image was obtained and stored for the medical record. Local anesthesia was attained by infiltration with  1% lidocaine. A small dermatotomy was made. Under real-time sonographic guidance, the vessel was punctured with a 21 gauge micropuncture needle. Using standard technique, the initial micro needle was exchanged over a 0.018 micro wire for a transitional 4 [place] micro sheath. The micro sheath was then exchanged over a 0.035 wire for a 5 French vascular sheath. A C2 cobra catheter was advanced in the abdominal aorta over a Bentson wire. The catheter was used to select the first vessel arising from the ventral aorta. Contrast was injected. This is a left gastric artery arising directly from the aorta. Anatomy is normal. No evidence of active bleeding. The Cobra catheter was then used to select the origin of the celiac artery. Arteriography was performed. The splenic artery is small in caliber. The hepatic artery demonstrates normal hepatic arterial anatomy. The gastroduodenal artery is slightly irregular with spasm in the proximal segment. No evidence of active extravasation. A glidewire was successfully advanced into the common hepatic artery and the C2 cobra catheter was advanced into the common hepatic artery. Additional arteriography was performed in multiple obliquities. Again, the proximal gastroduodenal artery is irregular and spasmodic. No evidence of dissection, aneurysm or active bleeding. A Lantern microcatheter was then advanced over a Fathom 16 wire and used to select the distal aspect of the gastroduodenal artery. Arteriography was performed. The gastroepiploic arteries are widely patent. No evidence of active bleeding. Coil embolization was then performed using a 4 mm Ruby POD detachable micro coil followed by a 45 cm Ruby packing coil. Post embolization arteriography demonstrates complete occlusion of the vessel with stasis in the proximal segment. The Lantern microcatheter was brought back into the common hepatic artery and arteriography was performed. Again, complete occlusion of the gastroduodenal  artery. The right gastric artery is visualized. No evidence of active bleeding from the right gastric artery. The microcatheter was removed. The C2 cobra catheter was used to select the superior mesenteric artery. Arteriography was performed. Patent pancreaticoduodenal arcade supplying the gastroepiploic artery. No evidence of active hemorrhage. The C2 cobra catheter was removed. A limited right common femoral arteriogram was performed confirming common femoral arterial access. Hemostasis was attained with the assistance of a Cordis ExoSeal extra arterial vascular plug. IMPRESSION: 1. No evidence of active hemorrhage. 2. 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 3. Prophylactic coil embolization of the gastroduodenal artery. Signed, Criselda Peaches, MD, [place] [facility] Radiology Electronically Signed   By: Jacqulynn Cadet M.D.   On: 09/07/2018 22:32   Ir US Guide Vasc Access Right  Result Date: 09/07/2018 INDICATION: 57 year old male with large volume upper GI bleed secondary to a very large duodenal ulcer. Patient underwent endoscopic intervention but continues to require pressors and large volume transfusion. He presents for mesenteric arteriography and embolization of any active bleeding and prophylactic embolization of the gastroduodenal artery. EXAM: IR ULTRASOUND GUIDANCE VASC ACCESS RIGHT; ADDITIONAL ARTERIOGRAPHY; IR EMBO ART VEN  HEMORR LYMPH EXTRAV INC GUIDE ROADMAPPING; SELECTIVE VISCERAL ARTERIOGRAPHY 1. Ultrasound-guided vascular access 2. Catheterization of the left gastric artery where it arises directly from the aorta with arteriogram 3. Catheterization of the celiac artery with arteriogram 4. Catheterization of the common hepatic artery with arteriogram 5. Catheterization of the gastroduodenal artery with arteriogram 6. Coil embolization of the gastroduodenal artery 7. Catheterization of the superior mesenteric artery  with arteriogram MEDICATIONS: None ANESTHESIA/SEDATION: Patient intubated and on propofol drip CONTRAST:  70 mL Isovue 370 FLUOROSCOPY TIME:  Fluoroscopy Time: 11 minutes 30 seconds (487 mGy). COMPLICATIONS: None immediate. PROCEDURE: Informed consent was obtained from the patient following explanation of the procedure, risks, benefits and alternatives. The patient understands, agrees and consents for the procedure. All questions were addressed. A time out was performed prior to the initiation of the procedure. Maximal barrier sterile technique utilized including caps, mask, sterile gowns, sterile gloves, large sterile drape, hand hygiene, and Betadine prep. The right common femoral artery was interrogated with ultrasound and found to be widely patent. An image was obtained and stored for the medical record. Local anesthesia was attained by infiltration with 1% lidocaine. A small dermatotomy was made. Under real-time sonographic guidance, the vessel was punctured with a 21 gauge micropuncture needle. Using standard technique, the initial micro needle was exchanged over a 0.018 micro wire for a transitional 4 [place] micro sheath. The micro sheath was then exchanged over a 0.035 wire for a 5 French vascular sheath. A C2 cobra catheter was advanced in the abdominal aorta over a Bentson wire. The catheter was used to select the first vessel arising from the ventral aorta. Contrast was injected. This is a left gastric artery arising directly from the aorta. Anatomy is normal. No evidence of active bleeding. The Cobra catheter was then used to select the origin of the celiac artery. Arteriography was performed. The splenic artery is small in caliber. The hepatic artery demonstrates normal hepatic arterial anatomy. The gastroduodenal artery is slightly irregular with spasm in the proximal segment. No evidence of active extravasation. A glidewire was successfully advanced into the common hepatic artery and the C2 cobra  catheter was advanced into the common hepatic artery. Additional arteriography was performed in multiple obliquities. Again, the proximal gastroduodenal artery is irregular and spasmodic. No evidence of dissection, aneurysm or active bleeding. A Lantern microcatheter was then advanced over a Fathom 16 wire and used to select the distal aspect of the gastroduodenal artery. Arteriography was performed. The gastroepiploic arteries are widely patent. No evidence of active bleeding. Coil embolization was then performed using a 4 mm Ruby POD detachable micro coil followed by a 45 cm Ruby packing coil. Post embolization arteriography demonstrates complete occlusion of the vessel with stasis in the proximal segment. The Lantern microcatheter was brought back into the common hepatic artery and arteriography was performed. Again, complete occlusion of the gastroduodenal artery. The right gastric artery is visualized. No evidence of active bleeding from the right gastric artery. The microcatheter was removed. The C2 cobra catheter was used to select the superior mesenteric artery. Arteriography was performed. Patent pancreaticoduodenal arcade supplying the gastroepiploic artery. No evidence of active hemorrhage. The C2 cobra catheter was removed. A limited right common femoral arteriogram was performed confirming common femoral arterial access. Hemostasis was attained with the assistance of a Cordis ExoSeal extra arterial vascular plug. IMPRESSION: 1. No evidence of active hemorrhage. 2. 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 3. Prophylactic coil embolization of the gastroduodenal artery. Signed, Criselda Peaches,  MD, [facility] Radiology Electronically Signed   By: Jacqulynn Cadet M.D.   On: 09/07/2018 22:32   Dg Chest Port 1 View  Result Date: 09/09/2018 CLINICAL DATA:  Respiratory failure EXAM: PORTABLE CHEST 1 VIEW COMPARISON:   09/07/2018 FINDINGS: Endotracheal tube and nasogastric catheter have been removed in the interval. Left jugular central line is again noted in the distal superior vena cava. The cardiac shadow is stable. The lungs are well aerated bilaterally with patchy bibasilar infiltrates and small left pleural effusion. IMPRESSION: Stable bibasilar infiltrates and small left pleural effusion. Electronically Signed   By: Inez Catalina M.D.   On: 09/09/2018 07:17   Dg Chest Port 1 View  Result Date: 09/07/2018 CLINICAL DATA:  Status post fasciotomy and hematoma back UA shins in the right lower leg 2 days ago. Intubated patient. EXAM: PORTABLE CHEST 1 VIEW COMPARISON:  CT scan of the chest of September 06, 2018 FINDINGS: The lungs are well-expanded. There are patchy increased densities at both bases which are slightly more conspicuous today. There is a small right and and somewhat larger left pleural effusion. The heart and pulmonary vascularity are normal. The endotracheal tube tip projects 4.1 cm above the carina. The esophagogastric tube tip in proximal port project below the GE junction. The left internal jugular venous catheter tip projects over the midportion of the SVC. IMPRESSION: Bibasilar atelectasis or pneumonia. Small right pleural effusion and somewhat larger left pleural effusion. The support tubes are in reasonable position. Electronically Signed   By: David  Martinique M.D.   On: 09/07/2018 13:27   Dg Abd Acute W/chest  Result Date: 08/15/2018 CLINICAL DATA:  Left-sided abdominal pain since July 22, 2018. Weakness and nausea. Vomiting beginning yesterday. Centralized chest pain. EXAM: DG ABDOMEN ACUTE W/ 1V CHEST COMPARISON:  None. FINDINGS: The heart, hila, and mediastinum are normal. No pneumothorax, pulmonary nodule, or infiltrate. No free air, portal venous gas, or pneumatosis. Moderate fecal loading in the colon. No bowel obstruction identified. A rounded calcification to the left of the L4 spinous  process is noted. IMPRESSION: 1. Moderate fecal loading throughout the colon. 2. Rounded 2 mm calcification to the left of the L4 spinous process could represent bowel contents. A ureteral stone could not be excluded on this study, however. Recommend clinical correlation. If there is concern for a ureteral stone, CT imaging could better evaluate. Electronically Signed   By: Dorise Bullion III M.D   On: 08/15/2018 19:02   Ct Renal Stone Study  Result Date: 08/22/2018 CLINICAL DATA:  Left flank pain. Nausea vomiting for 4 days. Previous appendectomy. EXAM: CT ABDOMEN AND PELVIS WITHOUT CONTRAST TECHNIQUE: Multidetector CT imaging of the abdomen and pelvis was performed following the standard protocol without IV contrast. COMPARISON:  CT scan December 19, 2017 FINDINGS: Lower chest: No acute abnormality. Hepatobiliary: No focal liver abnormality is seen. No gallstones, gallbladder wall thickening, or biliary dilatation. Pancreas: Unremarkable. No pancreatic ductal dilatation or surrounding inflammatory changes. Spleen: Normal in size without focal abnormality. Adrenals/Urinary Tract: Adrenal glands are normal. A 3 mm stone is again noted in the mid right kidney. No hydronephrosis or perinephric stranding. Bilateral extrarenal pelvises. No ureterectasis or ureteral stones noted. The bladder is unremarkable. Stomach/Bowel: The stomach is distended. Small bowel is normal. Moderate to severe fecal loading throughout the colon with a stool ball in the rectum. By report, the patient is status post appendectomy. Vascular/Lymphatic: Atherosclerotic changes are seen in the aorta the distal abdominal aorta measures  3 cm, mildly aneurysmal but stable. 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 Atherosclerotic changes seen in the aorta, iliac vessels, and femoral vessels. Reproductive: Prostate is unremarkable. Other: No abdominal wall hernia or  abnormality. No abdominopelvic ascites. Musculoskeletal: No acute or significant osseous findings. IMPRESSION: 1. Nonobstructive 3 mm stone in the right kidney. No renal obstruction or other stones noted. 2. Gastric distention of uncertain etiology. 3. Moderate to severe fecal loading throughout the colon. 4. Atherosclerotic changes throughout the abdominal aorta and iliac vessels. 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 Recommend followup by ultrasound in 3 years. This recommendation follows ACR consensus guidelines: White Paper of the ACR Incidental Findings Committee II on Vascular Findings. Joellyn Rued Radiol 2013; [id number] Electronically Signed   By: Dorise Bullion III M.D   On: 08/22/2018 19:54   Ir Embo Art  [place] Guide Roadmapping  Result Date: 09/07/2018 INDICATION: 57 year old male with large volume upper GI bleed secondary to a very large duodenal ulcer. Patient underwent endoscopic intervention but continues to require pressors and large volume transfusion. He presents for mesenteric arteriography and embolization of any active bleeding and prophylactic embolization of the gastroduodenal artery. EXAM: IR ULTRASOUND GUIDANCE VASC ACCESS RIGHT; ADDITIONAL ARTERIOGRAPHY; IR EMBO ART VEN HEMORR LYMPH EXTRAV INC GUIDE ROADMAPPING; SELECTIVE VISCERAL ARTERIOGRAPHY 1. Ultrasound-guided vascular access 2. Catheterization of the left gastric artery where it arises directly from the aorta with arteriogram 3. Catheterization of the celiac artery with arteriogram 4. Catheterization of the common hepatic artery with arteriogram 5. Catheterization of the gastroduodenal artery with arteriogram 6. Coil embolization of the gastroduodenal artery 7. Catheterization of the superior mesenteric artery with arteriogram MEDICATIONS: None ANESTHESIA/SEDATION: Patient intubated and on propofol drip  CONTRAST:  70 mL Isovue 370 FLUOROSCOPY TIME:  Fluoroscopy Time: 11 minutes 30 seconds (487 mGy). COMPLICATIONS: None immediate. PROCEDURE: Informed consent was obtained from the patient following explanation of the procedure, risks, benefits and alternatives. The patient understands, agrees and consents for the procedure. All questions were addressed. A time out was performed prior to the initiation of the procedure. Maximal barrier sterile technique utilized including caps, mask, sterile gowns, sterile gloves, large sterile drape, hand hygiene, and Betadine prep. The right common femoral artery was interrogated with ultrasound and found to be widely patent. An image was obtained and stored for the medical record. Local anesthesia was attained by infiltration with 1% lidocaine. A small dermatotomy was made. Under real-time sonographic guidance, the vessel was punctured with a 21 gauge micropuncture needle. Using standard technique, the initial micro needle was exchanged over a 0.018 micro wire for a transitional 4 [place] micro sheath. The micro sheath was then exchanged over a 0.035 wire for a 5 French vascular sheath. A C2 cobra catheter was advanced in the abdominal aorta over a Bentson wire. The catheter was used to select the first vessel arising from the ventral aorta. Contrast was injected. This is a left gastric artery arising directly from the aorta. Anatomy is normal. No evidence of active bleeding. The Cobra catheter was then used to select the origin of the celiac artery. Arteriography was performed. The splenic artery is small in caliber. The hepatic artery demonstrates normal hepatic arterial anatomy. The gastroduodenal artery is slightly irregular with spasm in the proximal segment. No evidence  of active extravasation. A glidewire was successfully advanced into the common hepatic artery and the C2 cobra catheter was advanced into the common hepatic artery. Additional arteriography was performed in  multiple obliquities. Again, the proximal gastroduodenal artery is irregular and spasmodic. No evidence of dissection, aneurysm or active bleeding. A Lantern microcatheter was then advanced over a Fathom 16 wire and used to select the distal aspect of the gastroduodenal artery. Arteriography was performed. The gastroepiploic arteries are widely patent. No evidence of active bleeding. Coil embolization was then performed using a 4 mm Ruby POD detachable micro coil followed by a 45 cm Ruby packing coil. Post embolization arteriography demonstrates complete occlusion of the vessel with stasis in the proximal segment. The Lantern microcatheter was brought back into the common hepatic artery and arteriography was performed. Again, complete occlusion of the gastroduodenal artery. The right gastric artery is visualized. No evidence of active bleeding from the right gastric artery. The microcatheter was removed. The C2 cobra catheter was used to select the superior mesenteric artery. Arteriography was performed. Patent pancreaticoduodenal arcade supplying the gastroepiploic artery. No evidence of active hemorrhage. The C2 cobra catheter was removed. A limited right common femoral arteriogram was performed confirming common femoral arterial access. Hemostasis was attained with the assistance of a Cordis ExoSeal extra arterial vascular plug. IMPRESSION: 1. No evidence of active hemorrhage. 2. 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 3. Prophylactic coil embolization of the gastroduodenal artery. Signed, Criselda Peaches, MD, [facility] Electronically Signed   By: Jacqulynn Cadet M.D.   On: 09/07/2018 22:32     LOS: 6 days   Oren Binet, MD  [facility]  If 7PM-7AM, please contact night-coverage  Please page via [email or web address]-click on MD name and type text message  09/09/2018, 10:50 AM

## 2018-09-10 ENCOUNTER — Inpatient Hospital Stay (HOSPITAL_COMMUNITY): Payer: Medicaid Other | Admitting: Certified Registered"

## 2018-09-10 ENCOUNTER — Encounter (HOSPITAL_COMMUNITY): Payer: Self-pay | Admitting: Certified Registered"

## 2018-09-10 ENCOUNTER — Encounter (HOSPITAL_COMMUNITY)
Admission: EM | Disposition: A | Discharge status: Skilled Nursing Facility | Payer: Self-pay | Source: Home / Self Care | Attending: Internal Medicine

## 2018-09-10 DIAGNOSIS — Z5181 Encounter for therapeutic drug level monitoring: Secondary | ICD-10-CM

## 2018-09-10 DIAGNOSIS — Z7901 Long term (current) use of anticoagulants: Secondary | ICD-10-CM

## 2018-09-10 DIAGNOSIS — I998 Other disorder of circulatory system: Secondary | ICD-10-CM

## 2018-09-10 HISTORY — PX: EMBOLECTOMY: SHX44

## 2018-09-10 HISTORY — PX: FASCIOTOMY CLOSURE: SHX5829

## 2018-09-10 LAB — COMPREHENSIVE METABOLIC PANEL
ALBUMIN: 1.3 g/dL — AB (ref 3.5–5.0)
ALT: 19 U/L (ref 0–44)
AST: 23 U/L (ref 15–41)
Alkaline Phosphatase: 52 U/L (ref 38–126)
Anion gap: 7 (ref 5–15)
BUN: 10 mg/dL (ref 6–20)
CO2: 24 mmol/L (ref 22–32)
Calcium: 7 mg/dL — ABNORMAL LOW (ref 8.9–10.3)
Chloride: 102 mmol/L (ref 98–111)
Creatinine, Ser: 0.57 mg/dL — ABNORMAL LOW (ref 0.61–1.24)
GFR calc Af Amer: >60 mL/min (ref >60)
GFR calc non Af Amer: >60 mL/min (ref >60)
GLUCOSE: 221 mg/dL — AB (ref 70–99)
Potassium: 3.3 mmol/L — ABNORMAL LOW (ref 3.5–5.1)
Sodium: 133 mmol/L — ABNORMAL LOW (ref 135–145)
Total Bilirubin: 0.6 mg/dL (ref 0.3–1.2)
Total Protein: 3.8 g/dL — ABNORMAL LOW (ref 6.5–8.1)

## 2018-09-10 LAB — BPAM RBC
Blood Product Expiration Date: 202001032359
Blood Product Expiration Date: 202001032359
Blood Product Expiration Date: 202001142359
Blood Product Expiration Date: 202001142359
Blood Product Expiration Date: 202001142359
Blood Product Expiration Date: 202001142359
Blood Product Expiration Date: 202001152359
ISSUE DATE / TIME: 201912170802
ISSUE DATE / TIME: 201912171005
ISSUE DATE / TIME: 201912171157
ISSUE DATE / TIME: 201912171515
ISSUE DATE / TIME: 201912171923
ISSUE DATE / TIME: 201912190112
ISSUE DATE / TIME: 201912191018
Unit Type and Rh: 7300
Unit Type and Rh: 7300
Unit Type and Rh: 7300
Unit Type and Rh: 7300
Unit Type and Rh: 7300
Unit Type and Rh: 7300
Unit Type and Rh: 7300

## 2018-09-10 LAB — CBC
HCT: 26.7 % — ABNORMAL LOW (ref 39.0–52.0)
Hemoglobin: 9.1 g/dL — ABNORMAL LOW (ref 13.0–17.0)
MCH: 28.9 pg (ref 26.0–34.0)
MCHC: 34.1 g/dL (ref 30.0–36.0)
MCV: 84.8 fL (ref 80.0–100.0)
NRBC: 0 % (ref 0.0–0.2)
Platelets: 151 10*3/uL (ref 150–400)
RBC: 3.15 MIL/uL — ABNORMAL LOW (ref 4.22–5.81)
RDW: 14.7 % (ref 11.5–15.5)
WBC: 10.4 10*3/uL (ref 4.0–10.5)

## 2018-09-10 LAB — TYPE AND SCREEN
ABO/RH(D): B POS
Antibody Screen: NEGATIVE
Unit division: 0
Unit division: 0
Unit division: 0
Unit division: 0
Unit division: 0
Unit division: 0
Unit division: 0

## 2018-09-10 LAB — MAGNESIUM: Magnesium: 1.8 mg/dL (ref 1.7–2.4)

## 2018-09-10 LAB — GLUCOSE, CAPILLARY
Glucose-Capillary: 207 mg/dL — ABNORMAL HIGH (ref 70–99)
Glucose-Capillary: 170 mg/dL — ABNORMAL HIGH (ref 70–99)
Glucose-Capillary: 153 mg/dL — ABNORMAL HIGH (ref 70–99)
Glucose-Capillary: 138 mg/dL — ABNORMAL HIGH (ref 70–99)
Glucose-Capillary: 154 mg/dL — ABNORMAL HIGH (ref 70–99)
Glucose-Capillary: 243 mg/dL — ABNORMAL HIGH (ref 70–99)

## 2018-09-10 LAB — PHOSPHORUS: Phosphorus: 2.3 mg/dL — ABNORMAL LOW (ref 2.5–4.6)

## 2018-09-10 LAB — SURGICAL PCR SCREEN
MRSA, PCR: NEGATIVE
Staphylococcus aureus: NEGATIVE

## 2018-09-10 SURGERY — FASCIOTOMY CLOSURE
Anesthesia: General | Laterality: Right

## 2018-09-10 MED ORDER — PHENYLEPHRINE 40 MCG/ML (10ML) SYRINGE FOR IV PUSH (FOR BLOOD PRESSURE SUPPORT)
PREFILLED_SYRINGE | INTRAVENOUS | Status: DC | PRN
  Administered 2018-09-10 (×2): 80 ug via INTRAVENOUS

## 2018-09-10 MED ORDER — HEPARIN SODIUM (PORCINE) 1000 UNIT/ML IJ SOLN
INTRAMUSCULAR | Status: AC
  Filled 2018-09-10: qty 1

## 2018-09-10 MED ORDER — PROPOFOL 10 MG/ML IV BOLUS
INTRAVENOUS | Status: AC
  Filled 2018-09-10: qty 20

## 2018-09-10 MED ORDER — DEXAMETHASONE SODIUM PHOSPHATE 10 MG/ML IJ SOLN
INTRAMUSCULAR | Status: AC
  Filled 2018-09-10: qty 1

## 2018-09-10 MED ORDER — MIDAZOLAM HCL 5 MG/5ML IJ SOLN
INTRAMUSCULAR | Status: DC | PRN
  Administered 2018-09-10: 2 mg via INTRAVENOUS

## 2018-09-10 MED ORDER — ONDANSETRON HCL 4 MG/2ML IJ SOLN
INTRAMUSCULAR | Status: DC | PRN
  Administered 2018-09-10: 4 mg via INTRAVENOUS

## 2018-09-10 MED ORDER — SODIUM CHLORIDE 0.9 % IV SOLN
INTRAVENOUS | Status: AC
  Filled 2018-09-10: qty 1.2

## 2018-09-10 MED ORDER — 0.9 % SODIUM CHLORIDE (POUR BTL) OPTIME
TOPICAL | Status: DC | PRN
  Administered 2018-09-10: 1000 mL

## 2018-09-10 MED ORDER — LACTATED RINGERS IV SOLN: INTRAVENOUS | Status: DC

## 2018-09-10 MED ORDER — HEPARIN (PORCINE) 25000 UT/250ML-% IV SOLN
400.0000 [IU]/h | INTRAVENOUS | Status: DC
  Administered 2018-09-10: 400 [IU]/h via INTRAVENOUS
  Filled 2018-09-10: qty 250

## 2018-09-10 MED ORDER — FENTANYL CITRATE (PF) 100 MCG/2ML IJ SOLN
INTRAMUSCULAR | Status: DC | PRN
  Administered 2018-09-10 (×2): 50 ug via INTRAVENOUS

## 2018-09-10 MED ORDER — DEXAMETHASONE SODIUM PHOSPHATE 10 MG/ML IJ SOLN
INTRAMUSCULAR | Status: DC | PRN
  Administered 2018-09-10: 5 mg via INTRAVENOUS

## 2018-09-10 MED ORDER — LACTATED RINGERS IV SOLN
INTRAVENOUS | Status: DC
  Administered 2018-09-10: 12:00:00 via INTRAVENOUS

## 2018-09-10 MED ORDER — PAPAVERINE HCL 30 MG/ML IJ SOLN
INTRAMUSCULAR | Status: AC
  Filled 2018-09-10: qty 2

## 2018-09-10 MED ORDER — OXYCODONE HCL 5 MG PO TABS: 5.0000 mg | ORAL_TABLET | Freq: Once | ORAL | Status: DC | PRN

## 2018-09-10 MED ORDER — FENTANYL CITRATE (PF) 100 MCG/2ML IJ SOLN
INTRAMUSCULAR | Status: AC
  Filled 2018-09-10: qty 2

## 2018-09-10 MED ORDER — ONDANSETRON HCL 4 MG/2ML IJ SOLN: 4.0000 mg | Freq: Once | INTRAMUSCULAR | Status: DC | PRN

## 2018-09-10 MED ORDER — FENTANYL CITRATE (PF) 100 MCG/2ML IJ SOLN
25.0000 ug | INTRAMUSCULAR | Status: DC | PRN
  Administered 2018-09-10 (×2): 50 ug via INTRAVENOUS

## 2018-09-10 MED ORDER — PROPOFOL 10 MG/ML IV BOLUS
INTRAVENOUS | Status: DC | PRN
  Administered 2018-09-10: 140 mg via INTRAVENOUS

## 2018-09-10 MED ORDER — PHENYLEPHRINE 40 MCG/ML (10ML) SYRINGE FOR IV PUSH (FOR BLOOD PRESSURE SUPPORT)
PREFILLED_SYRINGE | INTRAVENOUS | Status: AC
  Filled 2018-09-10: qty 10

## 2018-09-10 MED ORDER — CEFAZOLIN SODIUM-DEXTROSE 2-4 GM/100ML-% IV SOLN
2.0000 g | Freq: Three times a day (TID) | INTRAVENOUS | Status: AC
  Administered 2018-09-10 – 2018-09-11 (×2): 2 g via INTRAVENOUS
  Filled 2018-09-10 (×2): qty 100

## 2018-09-10 MED ORDER — OXYCODONE HCL 5 MG/5ML PO SOLN: 5.0000 mg | Freq: Once | ORAL | Status: DC | PRN

## 2018-09-10 MED ORDER — CEFAZOLIN SODIUM 1 G IJ SOLR
INTRAMUSCULAR | Status: AC
  Filled 2018-09-10: qty 20

## 2018-09-10 MED ORDER — PAPAVERINE HCL 30 MG/ML IJ SOLN
INTRAMUSCULAR | Status: DC | PRN
  Administered 2018-09-10: 60 mg via INTRAVENOUS

## 2018-09-10 MED ORDER — ONDANSETRON HCL 4 MG/2ML IJ SOLN
INTRAMUSCULAR | Status: AC
  Filled 2018-09-10: qty 2

## 2018-09-10 MED ORDER — LIDOCAINE 2% (20 MG/ML) 5 ML SYRINGE
INTRAMUSCULAR | Status: AC
  Filled 2018-09-10: qty 5

## 2018-09-10 MED ORDER — SODIUM CHLORIDE 0.9 % IV SOLN
INTRAVENOUS | Status: DC | PRN
  Administered 2018-09-10: 40 ug/min via INTRAVENOUS

## 2018-09-10 MED ORDER — HEPARIN SODIUM (PORCINE) 5000 UNIT/ML IJ SOLN
INTRAMUSCULAR | Status: DC | PRN
  Administered 2018-09-10: 5000 [IU] via SUBCUTANEOUS

## 2018-09-10 MED ORDER — PANTOPRAZOLE SODIUM 40 MG PO TBEC
40.0000 mg | DELAYED_RELEASE_TABLET | Freq: Two times a day (BID) | ORAL | Status: DC
  Administered 2018-09-10 – 2018-09-14 (×9): 40 mg via ORAL
  Filled 2018-09-10 (×9): qty 1

## 2018-09-10 MED ORDER — FENTANYL CITRATE (PF) 250 MCG/5ML IJ SOLN
INTRAMUSCULAR | Status: AC
  Filled 2018-09-10: qty 5

## 2018-09-10 MED ORDER — WHITE PETROLATUM EX OINT
TOPICAL_OINTMENT | CUTANEOUS | Status: AC
  Administered 2018-09-10: 07:00:00
  Filled 2018-09-10: qty 28.35

## 2018-09-10 MED ORDER — SODIUM CHLORIDE 0.9 % IV SOLN
INTRAVENOUS | Status: DC | PRN
  Administered 2018-09-10: 15:00:00

## 2018-09-10 MED ORDER — MIDAZOLAM HCL 2 MG/2ML IJ SOLN
INTRAMUSCULAR | Status: AC
  Filled 2018-09-10: qty 2

## 2018-09-10 SURGICAL SUPPLY — 49 items
BANDAGE ACE 4X5 VEL STRL LF (GAUZE/BANDAGES/DRESSINGS) IMPLANT
BANDAGE ACE 6X5 VEL STRL LF (GAUZE/BANDAGES/DRESSINGS) IMPLANT
BANDAGE ELASTIC 4 VELCRO ST LF (GAUZE/BANDAGES/DRESSINGS) ×2 IMPLANT
BLADE SURG 11 STRL SS (BLADE) ×2 IMPLANT
BNDG GAUZE ELAST 4 BULKY (GAUZE/BANDAGES/DRESSINGS) ×2 IMPLANT
BOOT SUTURE AID YELLOW STND (SUTURE) ×2 IMPLANT
CANISTER SUCT 3000ML PPV (MISCELLANEOUS) ×3 IMPLANT
CANNULA VESSEL 3MM 2 BLNT TIP (CANNULA) ×2 IMPLANT
CATH EMB 3FR 80CM (CATHETERS) ×2 IMPLANT
CATH EMB 4FR 80CM (CATHETERS) ×2 IMPLANT
CLIP VESOCCLUDE SM WIDE 6/CT (CLIP) ×2 IMPLANT
COVER WAND RF STERILE (DRAPES) ×3 IMPLANT
DRAPE HALF SHEET 40X57 (DRAPES) ×2 IMPLANT
DRAPE INCISE IOBAN 66X45 STRL (DRAPES) ×2 IMPLANT
ELECT REM PT RETURN 9FT ADLT (ELECTROSURGICAL) ×3
ELECTRODE REM PT RTRN 9FT ADLT (ELECTROSURGICAL) ×1 IMPLANT
GAUZE SPONGE 4X4 12PLY STRL (GAUZE/BANDAGES/DRESSINGS) ×1 IMPLANT
GAUZE SPONGE 4X4 12PLY STRL LF (GAUZE/BANDAGES/DRESSINGS) ×2 IMPLANT
GLOVE BIO SURGEON STRL SZ 6.5 (GLOVE) ×2 IMPLANT
GLOVE BIO SURGEON STRL SZ7.5 (GLOVE) ×3 IMPLANT
GLOVE BIO SURGEONS STRL SZ 6.5 (GLOVE) ×2
GLOVE BIOGEL PI IND STRL 6.5 (GLOVE) IMPLANT
GLOVE BIOGEL PI INDICATOR 6.5 (GLOVE) ×2
GLOVE SS N UNI LF 7.5 STRL (GLOVE) ×1 IMPLANT
GOWN BRE IMP SLV AUR LG STRL (GOWN DISPOSABLE) ×2 IMPLANT
GOWN STRL REUS W/ TWL LRG LVL3 (GOWN DISPOSABLE) ×3 IMPLANT
GOWN STRL REUS W/TWL LRG LVL3 (GOWN DISPOSABLE) ×6
KIT BASIN OR (CUSTOM PROCEDURE TRAY) ×3 IMPLANT
KIT TURNOVER KIT B (KITS) ×3 IMPLANT
LOOP VESSEL MAXI BLUE (MISCELLANEOUS) ×2 IMPLANT
LOOP VESSEL MINI RED (MISCELLANEOUS) ×4 IMPLANT
NDL 18GX1X1/2 (RX/OR ONLY) (NEEDLE) IMPLANT
NEEDLE 18GX1X1/2 (RX/OR ONLY) (NEEDLE) ×3 IMPLANT
NS IRRIG 1000ML POUR BTL (IV SOLUTION) ×3 IMPLANT
PACK GENERAL/GYN (CUSTOM PROCEDURE TRAY) ×3 IMPLANT
PACK UNIVERSAL I (CUSTOM PROCEDURE TRAY) ×3 IMPLANT
PAD ARMBOARD 7.5X6 YLW CONV (MISCELLANEOUS) ×3 IMPLANT
STAPLER VISISTAT 35W (STAPLE) ×2 IMPLANT
SUT ETHILON 3 0 PS 1 (SUTURE) IMPLANT
SUT PROLENE 6 0 BV (SUTURE) ×2 IMPLANT
SUT VIC AB 2-0 CTX 36 (SUTURE) IMPLANT
SUT VIC AB 3-0 SH 27 (SUTURE) ×6
SUT VIC AB 3-0 SH 27X BRD (SUTURE) IMPLANT
SUT VICRYL 4-0 PS2 18IN ABS (SUTURE) IMPLANT
SYR TB 1ML LUER SLIP (SYRINGE) ×2 IMPLANT
SYRINGE 35CC LL (MISCELLANEOUS) ×2 IMPLANT
SYRINGE 3CC LL L/F (MISCELLANEOUS) ×2 IMPLANT
TOWEL GREEN STERILE (TOWEL DISPOSABLE) ×3 IMPLANT
WATER STERILE IRR 1000ML POUR (IV SOLUTION) ×3 IMPLANT

## 2018-09-10 NOTE — Op Note (Signed)
    NAME: Ryan Medina    MRN: [id number] DOB: 05/22/1961    DATE OF OPERATION: 09/10/2018  PREOP DIAGNOSIS:    Right tibial embolus Status post fasciotomies  POSTOP DIAGNOSIS:    Same  PROCEDURE:    1. right popliteal and tibial embolectomy 2. closure of medial and lateral fasciotomy incisions  SURGEON: Judeth Cornfield. Scot Dock, MD, FACS  ASSIST: Leontine Locket, PA  ANESTHESIA: General  EBL: Minimal  INDICATIONS:    Ryan Medina is a 57 y.o. male who underwent popliteal and tibial embolectomy.  Source of embolization is not clear.  Postoperative course was complicated by a significant GI bleed with a very large duodenal ulcer.  For this reason he underwent coil embolization of the gastroduodenal artery.  He is at significant risk for rebleeding and his anticoagulation was stopped.  He had undergone 4 compartment fasciotomy and was to return today for closure of his fasciotomies.  However he was noted to have absent posterior tibial signal with the Doppler which was biphasic prior.  For this reason I also recommended tibial embolectomy at the time of his fasciotomy closure.  FINDINGS:   Biphasic posterior tibial signal with a Doppler at the completion.  Brisk peroneal signal and monophasic anterior tibial signal on the right.  TECHNIQUE:   The patient was taken to the operating room and received a general anesthetic.  The right lower extremity was prepped and draped in usual sterile fashion.  The previous deep closure in the right medial incision was opened and the previously placed bovine pericardial patch was identified.  I controlled the popliteal artery above the patch closure.  I also controlled the anterior tibial, peroneal, and posterior tibial arteries with Vesseloops.  The patient received 5000 units of IV heparin.  I made a longitudinal venotomy in the patch.  A 3 Fogarty catheter was passed multiple times proximally and some old organized clot was retrieved.  I also  passed the catheter down the anterior tibial artery multiple times.  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  This is the same finding from the previous surgery.  The catheter was then passed down the peroneal artery.  This would go 30 cm consistently.  Some clot was retrieved from the peroneal artery.  This also was somewhat organized.  Next the catheter was passed 40 cm down the posterior tibial artery multiple times until no further clot was retrieved.  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  Once no further clot was retrieved I closed the incision in the patch with a running 6-0 Prolene suture.  At the completion was a biphasic posterior tibial signal with the Doppler.  There was a brisk peroneal signal with the Doppler and a monophasic dorsalis pedis signal.  Attention was then turned to closure of the fasciotomies.  On the medial incision the deep layer was closed with running 3-0 Vicryl and the skin closed with staples.  On the lateral incision I could not close the deep layers there was not enough subcutaneous tissue.  The skin was closed with staples.  Sterile dressing was applied.  The patient tolerated procedure well was transferred to the recovery room in stable condition.  All needle and sponge counts were correct.  [name], MD, FACS [facility]  DATE OF DICTATION:   09/10/2018

## 2018-09-10 NOTE — Anesthesia Preprocedure Evaluation (Addendum)
Anesthesia Evaluation  Patient identified by MRN, date of birth, ID band Patient awake    Reviewed: Allergy & Precautions, NPO status , Patient's Chart, lab work & pertinent test results  History of Anesthesia Complications Negative for: history of anesthetic complications  Airway Mallampati: II  TM Distance: >3 FB Neck ROM: Full    Dental  (+) Dental Advisory Given, Teeth Intact   Pulmonary Current Smoker,    breath sounds clear to auscultation       Cardiovascular hypertension, Pt. on medications + Peripheral Vascular Disease and +CHF   Rhythm:Regular Rate:Normal   '19 TTE - Mild LVH. EF 45% to 50%. Grade 1 diastolic dysfunction. Trivial AI. Mild MR and TR.     Neuro/Psych negative neurological ROS  negative psych ROS   GI/Hepatic GERD  Controlled,(+)     substance abuse  cocaine use,   Endo/Other  diabetes, Insulin Dependent Hyponatremia Hypocalcemia  Renal/GU Renal InsufficiencyRenal disease     Musculoskeletal  Chronic back pain    Abdominal   Peds  Hematology  (+) anemia ,   Anesthesia Other Findings   Reproductive/Obstetrics                           Anesthesia Physical Anesthesia Plan  ASA: III  Anesthesia Plan: General   Post-op Pain Management:    Induction: Intravenous  PONV Risk Score and Plan: 2 and Treatment may vary due to age or medical condition, Ondansetron and Midazolam  Airway Management Planned: LMA  Additional Equipment: None  Intra-op Plan:   Post-operative Plan: Extubation in OR  Informed Consent: I have reviewed the patients History and Physical, chart, labs and discussed the procedure including the risks, benefits and alternatives for the proposed anesthesia with the patient or authorized representative who has indicated his/her understanding and acceptance.   Dental advisory given  Plan Discussed with: CRNA and  Anesthesiologist  Anesthesia Plan Comments:        Anesthesia Quick Evaluation

## 2018-09-10 NOTE — Progress Notes (Addendum)
PROGRESS NOTE        PATIENT DETAILS Name: Ryan Medina Age: 57 y.o. Sex: male Date of Birth: [birth year] Admit Date: 09/03/2018 Admitting Physician [name], MD [facility]:[id number], No Pcp Per  Brief Narrative: Patient is a 57 y.o. male with history of cocaine use, chronic systolic heart failure presented to the hospital on 12/13 with a right ischemic leg secondary to acute popliteal and tibial embolus, underwent embolectomy on 12/13 and subsequently was placed on a heparin drip.  He unfortunately developed a acute right lower leg hematoma with compartment syndrome requiring fasciotomy on 12/15.  Further hospital course was complicated by development of hemorrhagic shock with acute blood loss anemia secondary to a bleeding duodenal ulcer-requiring ICU transfer-intubation for airway protection and IR evaluation with mesenteric angiogram and GDA embolization.  Upon stability he was transferred back to the triad hospitalist service on 12/19.  See below for further details.  Subjective: Still with some dark stools but claims that these have improved-hemoglobin is stable and has risen appropriately following his PRBC transfusion on 12/19.   Assessment/Plan: Right ischemic leg secondary to popliteal artery/tibial artery embolism-s/p embolectomy on 57/89 complicated by right lower extremity hematoma with compartment syndrome requiring fasciotomy on 12/15: Source of embolus is not known but given history of chronic systolic heart failure-likely cardiac.  He has no prior history of VTE/arterial thrombosis.  Currently unable to restart anticoagulation due to GI bleeding with hemorrhagic shock.  However as he continues to be more stable over the next few days-probably will require a TEE at some point next week.  Once he is a few more days out from surgery/bleeding-may also require a hypercoagulable panel.  Vascular surgery following-plans for a partial closure of his fasciotomy  today.  Upper GI bleeding with hemorrhagic shock and acute blood loss anemia: Hemorrhagic shock has resolved still with some black stools but given clinical stability-an appropriate rise of hemoglobin with PRBC transfusion on 12/19-this is suspected to be old blood making its way out.  Underwent EGD and subsequent GDA embolization on 12/17.  Remains on PPI twice daily.  Have asked GI-about appropriate timing of resuming anticoagulation-he may require a second look endoscopy.  Has required 8 units of PRBC so far, 1 unit of FFP and 1 unit pheresed platelets.  Acute hypoxic respiratory failure: Intubated when he developed hemorrhagic shock secondary to GI bleeding, successfully extubated-currently stable on 2 L of oxygen via nasal cannula.  Chronic systolic heart failure (EF 45-50% by TTE on 09/04/2018): Reasonably well compensated-follow weights, volume status, electrolytes closely.  Insulin-dependent DM-2: Last A1c on 12/2 was 18.2.  Since he was n.p.o.-and on a clear liquid diet-he was allowed to have mild hyperglycemia-since CBGs are now stable-we will start low-dose Lantus at 10 units nightly.  Follow and adjust accordingly.  Cocaine use/tobacco use: Counseled-I am still not sure if he has any intention of quitting.  3 cm AAA: Stable for follow-up in the outpatient setting  Note-probably can discontinue triple-lumen catheter on 12/21 if a continues to have no evidence of recurrent GI bleeding.  Foley catheter will be discontinued on 12/20.  DVT Prophylaxis: SCD's  Code Status: Full code   Family Communication: None at bedside  Disposition Plan: Remain inpatient-requires several more days of hospitalization before consideration of discharge  Antimicrobial agents: Anti-infectives (From admission, onward)   Start  Dose/Rate Route Frequency Ordered Stop   09/10/18 0800  [MAR Hold]  ceFAZolin (ANCEF) IVPB 1 g/50 mL premix     (MAR Hold since Fri 09/10/2018 at 1157. Reason: Transfer to  a Procedural area.)  Note to Pharmacy:  Send with pt to OR   1 g 100 mL/hr over 30 Minutes Intravenous On call 09/09/18 0756 09/11/18 0800   09/07/18 1300  erythromycin 250 mg in sodium chloride 0.9 % 100 mL IVPB     250 mg 100 mL/hr over 60 Minutes Intravenous Once 09/07/18 1125 09/07/18 2351   09/06/18 0000  ceFAZolin (ANCEF) IVPB 1 g/50 mL premix    Note to Pharmacy:  Send with pt to OR   1 g 100 mL/hr over 30 Minutes Intravenous On call 09/05/18 0849 09/05/18 0952   09/03/18 2245  ceFAZolin (ANCEF) IVPB 2g/100 mL premix     2 g 200 mL/hr over 30 Minutes Intravenous Every 8 hours 09/03/18 2236 09/04/18 1444   09/03/18 1615  ceFAZolin (ANCEF) IVPB 2g/100 mL premix  Status:  Discontinued     2 g 200 mL/hr over 30 Minutes Intravenous On call to O.R. 09/03/18 1600 09/03/18 2035      Procedures: 12/17>>Mesenteric angiogram and coil embolization of the GDA  12/17>>EGD  12/15>> 1.  Evacuation hematoma right leg 2.  Evacuation of lymphocele right leg 3.  4 compartment fasciotomy 4.  Placement of VAC (medial and lateral)  12/13>> 1.  Ultrasound-guided access to the left common femoral artery 2.  Aortogram with bilateral iliac arteriogram 3.  Selective catheterization of the right external iliac artery with right lower extremity runoff   CONSULTS:  pulmonary/intensive care, vascular surgery and IR  Time spent: 40 minutes-Greater than 50% of this time was spent in counseling, explanation of diagnosis, planning of further management, and coordination of care.  MEDICATIONS: Scheduled Meds: . [MAR Hold] atorvastatin  20 mg Oral Daily  . [MAR Hold] chlorhexidine gluconate (MEDLINE KIT)  15 mL Mouth Rinse BID  . [MAR Hold] insulin aspart  0-9 Units Subcutaneous TID WC  . [MAR Hold] mouth rinse  15 mL Mouth Rinse BID  . pantoprazole  40 mg Oral BID   Continuous Infusions: . [MAR Hold] sodium chloride    . [MAR Hold]  ceFAZolin (ANCEF) IV    . dextrose 5 % and 0.9% NaCl  Stopped (09/10/18 1222)  . lactated ringers    . lactated ringers 50 mL/hr at 09/10/18 1224   PRN Meds:.[MAR Hold] sodium chloride, [MAR Hold] ipratropium-albuterol, [MAR Hold] LORazepam, [MAR Hold] ondansetron **OR** [MAR Hold] ondansetron (ZOFRAN) IV, [MAR Hold] oxyCODONE-acetaminophen, [MAR Hold] polyethylene glycol, [MAR Hold] potassium chloride, [MAR Hold] senna-docusate, [MAR Hold] sodium chloride flush   PHYSICAL EXAM: Vital signs: Vitals:   09/09/18 1246 09/09/18 1304 09/09/18 2255 09/10/18 0648  BP: 116/80 124/90 127/89 124/85  Pulse: 89 81 69 89  Resp: 17 16 16 16   Temp: 98.7 F (37.1 C) 97.9 F (36.6 C) 98.2 F (36.8 C) 98.1 F (36.7 C)  TempSrc: Oral Oral Oral Oral  SpO2: 100%  100% 100%  Weight:      Height:       Filed Weights   09/03/18 1650  Weight: 66.7 kg   Body mass index is 20.51 kg/m.   General appearance:Awake, alert, not in any distress.  Eyes:no scleral icterus. HEENT: Atraumatic and Normocephalic Neck: supple, no JVD. Resp:Good air entry bilaterally,no rales or rhonchi CVS: S1 S2 regular, no murmurs.  GI: Bowel sounds present, Non  tender and not distended with no gaurding, rigidity or rebound. Extremities: B/L Lower Ext shows no edema Neurology:  Non focal Psychiatric: Normal judgment and insight. Normal mood. Musculoskeletal:No digital cyanosis Skin:No Rash, warm and dry Wounds:N/A  I have personally reviewed following labs and imaging studies  LABORATORY DATA: CBC: Recent Labs  Lab 09/07/18 1633 09/08/18 0255 09/08/18 1454 09/08/18 2326 09/09/18 0437 09/09/18 0711 09/09/18 1650 09/10/18 0412  WBC 10.8* 11.3* 12.3* 13.4*  --  12.4*  --  10.4  NEUTROABS 10.4* 9.5* 11.2* 11.5*  --  11.5*  --   --   HGB 6.8* 8.7* 7.9* 7.0* 7.2* 7.3* 9.2* 9.1*  HCT 20.3* 25.9* 24.5* 21.4* 21.7* 22.3* 27.1* 26.7*  MCV 84.6 85.8 85.4 83.9  --  84.8  --  84.8  PLT 216 231 165 146*  --  140*  --  532    Basic Metabolic Panel: Recent Labs  Lab  09/06/18 0233 09/07/18 0349 09/07/18 1800 09/08/18 0255 09/08/18 0500 09/09/18 0437 09/09/18 0711 09/10/18 0412  NA 137 136 139 140 141  --  133* 133*  K 3.8 3.9 4.1 3.9 4.0  --  3.5 3.3*  CL 88* 96* 104 108 110  --  103 102  CO2 36* 30 28 25 25   --  24 24  GLUCOSE 242* 112* 231* 94 94  --  290* 221*  BUN 8 26* 39* 31* 32*  --  15 10  CREATININE 0.83 0.78 0.94 0.75 0.69  --  0.60* 0.57*  CALCIUM 8.1* 7.2* 6.4* 7.2* 7.3*  --  6.8* 7.0*  MG 2.1 2.0  --   --  2.1 1.8  --  1.8  PHOS  --   --   --   --  2.8 2.0*  --  2.3*    GFR: Estimated Creatinine Clearance: 96.1 mL/min (A) (by C-G formula based on SCr of 0.57 mg/dL (L)).  Liver Function Tests: Recent Labs  Lab 09/04/18 0724 09/07/18 1800 09/08/18 0500 09/09/18 0711 09/10/18 0412  AST 31 26  --  32 23  ALT 16 15  --  19 19  ALKPHOS 48 24*  --  44 52  BILITOT 0.5 0.6  --  0.7 0.6  PROT 4.9* 3.3*  --  3.5* 3.8*  ALBUMIN 2.5* 1.4* 1.5* 1.3* 1.3*   No results for input(s): LIPASE, AMYLASE in the last 168 hours. Recent Labs  Lab 09/07/18 1630  AMMONIA 25    Coagulation Profile: Recent Labs  Lab 09/07/18 1633  INR 1.28    Cardiac Enzymes: Recent Labs  Lab 09/03/18 2258 09/04/18 0724 09/04/18 1025  TROPONINI <0.03 <0.03 <0.03    BNP (last 3 results) No results for input(s): PROBNP in the last 8760 hours.  HbA1C: No results for input(s): HGBA1C in the last 72 hours.  CBG: Recent Labs  Lab 09/09/18 1217 09/09/18 1700 09/09/18 2252 09/10/18 0856 09/10/18 1137  GLUCAP 209* 158* 186* 207* 170*    Lipid Profile: Recent Labs    09/07/18 1800  TRIG 107    Thyroid Function Tests: No results for input(s): TSH, T4TOTAL, FREET4, T3FREE, THYROIDAB in the last 72 hours.  Anemia Panel: No results for input(s): VITAMINB12, FOLATE, FERRITIN, TIBC, IRON, RETICCTPCT in the last 72 hours.  Urine analysis:    Component Value Date/Time   COLORURINE STRAW (A) 08/22/2018 2054   APPEARANCEUR CLEAR  08/22/2018 2054   LABSPEC 1.027 08/22/2018 2054   PHURINE 6.0 08/22/2018 2054   GLUCOSEU >=500 (A) 08/22/2018  2054   [place] NEGATIVE 08/22/2018 2054   [place] NEGATIVE 08/22/2018 2054   KETONESUR 80 (A) 08/22/2018 2054   PROTEINUR NEGATIVE 08/22/2018 2054   UROBILINOGEN 4.0 (H) 12/05/2016 0921   NITRITE NEGATIVE 08/22/2018 2054   LEUKOCYTESUR NEGATIVE 08/22/2018 2054    Sepsis Labs: Lactic Acid, Venous    Component Value Date/Time   LATICACIDVEN 1.7 09/07/2018 1600    MICROBIOLOGY: Recent Results (from the past 240 hour(s))  Surgical pcr screen     Status: None   Collection Time: 09/10/18  3:39 AM  Result Value Ref Range Status   MRSA, PCR NEGATIVE NEGATIVE Final   Staphylococcus aureus NEGATIVE NEGATIVE Final    Comment: (NOTE) The Xpert SA Assay (FDA approved for NASAL specimens in patients 45 years of age and older), is one component of a comprehensive surveillance program. It is not intended to diagnose infection nor to guide or monitor treatment. Performed at [facility], [place] [address], [place], [place] [address]     RADIOLOGY STUDIES/RESULTS: Dg Abd 1 View  Result Date: 09/07/2018 CLINICAL DATA:  OG tube placement. EXAM: ABDOMEN - 1 VIEW COMPARISON:  CT abdomen pelvis 08/22/2018. FINDINGS: OG tube is in place with the side port and tip in the body of the stomach. The stomach is distended. IMPRESSION: OG tube in good position. Electronically Signed   By: Inge Rise M.D.   On: 09/07/2018 13:31   Ct Angio Chest Pe W Or Wo Contrast  Result Date: 09/06/2018 CLINICAL DATA:  Evaluate thoracic aortic aneurysm. EXAM: CT ANGIOGRAPHY CHEST WITH CONTRAST TECHNIQUE: Multidetector CT imaging of the chest was performed using the standard protocol during bolus administration of intravenous contrast. Multiplanar CT image reconstructions and MIPs were obtained to evaluate the vascular anatomy. CONTRAST:  65 mL ISOVUE-370 IOPAMIDOL (ISOVUE-370) INJECTION 76%  COMPARISON:  01/30/2018; 10/14/2016; 04/25/2026 FINDINGS: Vascular Findings: No evidence of thoracic aortic aneurysm with measurements as follows. No definite evidence of thoracic aortic dissection or periaortic stranding on this nongated examination. Bovine configuration of the aortic arch. The branch vessels of the aortic arch appear widely patent throughout their imaged courses. The descending thoracic aorta is of normal caliber and widely patent without hemodynamically significant stenosis. There is a very minimal amount of atherosclerotic plaque involving the proximal aspect of the descending thoracic aorta. Cardiomegaly.  No pericardial effusion. Although this examination was not tailored for the evaluation the pulmonary arteries, there are no discrete filling defects within the central pulmonary arterial tree to suggest central pulmonary embolism. Borderline enlarged caliber the main pulmonary artery measuring 32 mm in diameter (image 67, series 6) ------------------------------------------------------------- Thoracic aortic measurements: Sinotubular junction 33 mm as measured in greatest oblique coronal dimension. Proximal ascending aorta 35 mm as measured in greatest oblique axial dimension at the level of the main pulmonary artery (image 70, series 6); and approximately 36 mm in greatest oblique short axis coronal diameter (coronal image 48, series 9). Aortic arch aorta 28 mm as measured in greatest oblique sagittal dimension. Proximal descending thoracic aorta 30 mm as measured in greatest oblique axial dimension at the level of the main pulmonary artery. Distal descending thoracic aorta 29 mm as measured in greatest oblique axial dimension at the level of the diaphragmatic hiatus. Review of the MIP images confirms the above findings. ------------------------------------------------------------- Non-Vascular Findings: Mediastinum/Lymph Nodes: Prominent right hilar lymph nodes are not enlarged by size  criteria with index right suprahilar lymph node measuring 0.8 cm in greatest short axis diameter (image 71, series 6) and  index right infrahilar lymph node measuring 0.7 cm (image 88). No definitive bulky mediastinal, hilar or axillary lymphadenopathy. Lungs/Pleura: Interval development of small bilateral pleural effusions with worsening bibasilar consolidative opacities and associated air bronchograms, right greater than left. Interval development of an approximately 1.0 x 0.5 cm subpleural consolidative opacity within the right upper lobe (image 36, series 7). Unchanged punctate (approximately 3 mm) calcified granuloma within the left lower lobe (image 75, series 3). Evaluation for additional pulmonary nodules is degraded secondary to bibasilar airspace opacities. The central pulmonary airways appear widely patent. Upper abdomen: Fluid distention of the mid and distal aspects of the esophagus with marked distension of the imaged superior aspect of the stomach. Musculoskeletal: No acute or aggressive osseous abnormalities. There is incomplete fusion involving the spinous processes of multiple thoracic vertebral bodies, unchanged. Regional soft tissues appear normal. Normal appearance of the thyroid gland. IMPRESSION: 1. No evidence of thoracic aortic aneurysm or dissection. 2. Small bilateral effusions with associated bibasilar consolidative opacities with associated air bronchograms, atelectasis versus infiltrate. Follow-up chest radiograph in 4-6 weeks after treatment is recommended to ensure resolution. 3. Fluid distension of the mid and distal aspects of the esophagus as well as marked distension of the imaged superior aspect stomach - correlation for gastric outlet obstructive symptoms is advised. 4. Cardiomegaly with enlargement of the caliber the main pulmonary artery as could be seen in the setting of pulmonary arterial hypertension. Electronically Signed   By: Sandi Mariscal M.D.   On: 09/06/2018 09:20   Ir  Angiogram Visceral Selective  Result Date: 09/07/2018 INDICATION: 57 year old male with large volume upper GI bleed secondary to a very large duodenal ulcer. Patient underwent endoscopic intervention but continues to require pressors and large volume transfusion. He presents for mesenteric arteriography and embolization of any active bleeding and prophylactic embolization of the gastroduodenal artery. EXAM: IR ULTRASOUND GUIDANCE VASC ACCESS RIGHT; ADDITIONAL ARTERIOGRAPHY; IR EMBO ART VEN HEMORR LYMPH EXTRAV INC GUIDE ROADMAPPING; SELECTIVE VISCERAL ARTERIOGRAPHY 1. Ultrasound-guided vascular access 2. Catheterization of the left gastric artery where it arises directly from the aorta with arteriogram 3. Catheterization of the celiac artery with arteriogram 4. Catheterization of the common hepatic artery with arteriogram 5. Catheterization of the gastroduodenal artery with arteriogram 6. Coil embolization of the gastroduodenal artery 7. Catheterization of the superior mesenteric artery with arteriogram MEDICATIONS: None ANESTHESIA/SEDATION: Patient intubated and on propofol drip CONTRAST:  70 mL Isovue 370 FLUOROSCOPY TIME:  Fluoroscopy Time: 11 minutes 30 seconds (487 mGy). COMPLICATIONS: None immediate. PROCEDURE: Informed consent was obtained from the patient following explanation of the procedure, risks, benefits and alternatives. The patient understands, agrees and consents for the procedure. All questions were addressed. A time out was performed prior to the initiation of the procedure. Maximal barrier sterile technique utilized including caps, mask, sterile gowns, sterile gloves, large sterile drape, hand hygiene, and Betadine prep. The right common femoral artery was interrogated with ultrasound and found to be widely patent. An image was obtained and stored for the medical record. Local anesthesia was attained by infiltration with 1% lidocaine. A small dermatotomy was made. Under real-time sonographic  guidance, the vessel was punctured with a 21 gauge micropuncture needle. Using standard technique, the initial micro needle was exchanged over a 0.018 micro wire for a transitional 4 [place] micro sheath. The micro sheath was then exchanged over a 0.035 wire for a 5 French vascular sheath. A C2 cobra catheter was advanced in the abdominal aorta over a Bentson wire. The catheter was used to  select the first vessel arising from the ventral aorta. Contrast was injected. This is a left gastric artery arising directly from the aorta. Anatomy is normal. No evidence of active bleeding. The Cobra catheter was then used to select the origin of the celiac artery. Arteriography was performed. The splenic artery is small in caliber. The hepatic artery demonstrates normal hepatic arterial anatomy. The gastroduodenal artery is slightly irregular with spasm in the proximal segment. No evidence of active extravasation. A glidewire was successfully advanced into the common hepatic artery and the C2 cobra catheter was advanced into the common hepatic artery. Additional arteriography was performed in multiple obliquities. Again, the proximal gastroduodenal artery is irregular and spasmodic. No evidence of dissection, aneurysm or active bleeding. A Lantern microcatheter was then advanced over a Fathom 16 wire and used to select the distal aspect of the gastroduodenal artery. Arteriography was performed. The gastroepiploic arteries are widely patent. No evidence of active bleeding. Coil embolization was then performed using a 4 mm Ruby POD detachable micro coil followed by a 45 cm Ruby packing coil. Post embolization arteriography demonstrates complete occlusion of the vessel with stasis in the proximal segment. The Lantern microcatheter was brought back into the common hepatic artery and arteriography was performed. Again, complete occlusion of the gastroduodenal artery. The right gastric artery is visualized. No evidence of active  bleeding from the right gastric artery. The microcatheter was removed. The C2 cobra catheter was used to select the superior mesenteric artery. Arteriography was performed. Patent pancreaticoduodenal arcade supplying the gastroepiploic artery. No evidence of active hemorrhage. The C2 cobra catheter was removed. A limited right common femoral arteriogram was performed confirming common femoral arterial access. Hemostasis was attained with the assistance of a Cordis ExoSeal extra arterial vascular plug. IMPRESSION: 1. No evidence of active hemorrhage. 2. 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 3. Prophylactic coil embolization of the gastroduodenal artery. Signed, Criselda Peaches, MD, [facility] Electronically Signed   By: Jacqulynn Cadet M.D.   On: 09/07/2018 22:32   Ir Angiogram Visceral Selective  Result Date: 09/07/2018 INDICATION: 57 year old male with large volume upper GI bleed secondary to a very large duodenal ulcer. Patient underwent endoscopic intervention but continues to require pressors and large volume transfusion. He presents for mesenteric arteriography and embolization of any active bleeding and prophylactic embolization of the gastroduodenal artery. EXAM: IR ULTRASOUND GUIDANCE VASC ACCESS RIGHT; ADDITIONAL ARTERIOGRAPHY; IR EMBO ART VEN HEMORR LYMPH EXTRAV INC GUIDE ROADMAPPING; SELECTIVE VISCERAL ARTERIOGRAPHY 1. Ultrasound-guided vascular access 2. Catheterization of the left gastric artery where it arises directly from the aorta with arteriogram 3. Catheterization of the celiac artery with arteriogram 4. Catheterization of the common hepatic artery with arteriogram 5. Catheterization of the gastroduodenal artery with arteriogram 6. Coil embolization of the gastroduodenal artery 7. Catheterization of the superior mesenteric artery with arteriogram MEDICATIONS: None ANESTHESIA/SEDATION: Patient  intubated and on propofol drip CONTRAST:  70 mL Isovue 370 FLUOROSCOPY TIME:  Fluoroscopy Time: 11 minutes 30 seconds (487 mGy). COMPLICATIONS: None immediate. PROCEDURE: Informed consent was obtained from the patient following explanation of the procedure, risks, benefits and alternatives. The patient understands, agrees and consents for the procedure. All questions were addressed. A time out was performed prior to the initiation of the procedure. Maximal barrier sterile technique utilized including caps, mask, sterile gowns, sterile gloves, large sterile drape, hand hygiene, and Betadine prep. The right common femoral artery was interrogated with ultrasound and found to be widely patent. An  image was obtained and stored for the medical record. Local anesthesia was attained by infiltration with 1% lidocaine. A small dermatotomy was made. Under real-time sonographic guidance, the vessel was punctured with a 21 gauge micropuncture needle. Using standard technique, the initial micro needle was exchanged over a 0.018 micro wire for a transitional 4 [place] micro sheath. The micro sheath was then exchanged over a 0.035 wire for a 5 French vascular sheath. A C2 cobra catheter was advanced in the abdominal aorta over a Bentson wire. The catheter was used to select the first vessel arising from the ventral aorta. Contrast was injected. This is a left gastric artery arising directly from the aorta. Anatomy is normal. No evidence of active bleeding. The Cobra catheter was then used to select the origin of the celiac artery. Arteriography was performed. The splenic artery is small in caliber. The hepatic artery demonstrates normal hepatic arterial anatomy. The gastroduodenal artery is slightly irregular with spasm in the proximal segment. No evidence of active extravasation. A glidewire was successfully advanced into the common hepatic artery and the C2 cobra catheter was advanced into the common hepatic artery. Additional  arteriography was performed in multiple obliquities. Again, the proximal gastroduodenal artery is irregular and spasmodic. No evidence of dissection, aneurysm or active bleeding. A Lantern microcatheter was then advanced over a Fathom 16 wire and used to select the distal aspect of the gastroduodenal artery. Arteriography was performed. The gastroepiploic arteries are widely patent. No evidence of active bleeding. Coil embolization was then performed using a 4 mm Ruby POD detachable micro coil followed by a 45 cm Ruby packing coil. Post embolization arteriography demonstrates complete occlusion of the vessel with stasis in the proximal segment. The Lantern microcatheter was brought back into the common hepatic artery and arteriography was performed. Again, complete occlusion of the gastroduodenal artery. The right gastric artery is visualized. No evidence of active bleeding from the right gastric artery. The microcatheter was removed. The C2 cobra catheter was used to select the superior mesenteric artery. Arteriography was performed. Patent pancreaticoduodenal arcade supplying the gastroepiploic artery. No evidence of active hemorrhage. The C2 cobra catheter was removed. A limited right common femoral arteriogram was performed confirming common femoral arterial access. Hemostasis was attained with the assistance of a Cordis ExoSeal extra arterial vascular plug. IMPRESSION: 1. No evidence of active hemorrhage. 2. 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 3. Prophylactic coil embolization of the gastroduodenal artery. Signed, Criselda Peaches, MD, [place] [facility] Radiology Electronically Signed   By: Jacqulynn Cadet M.D.   On: 09/07/2018 22:32   Ir Angiogram Selective Each Additional Vessel  Result Date: 09/07/2018 INDICATION: 57 year old male with large volume upper GI bleed secondary to a very large duodenal ulcer. Patient underwent  endoscopic intervention but continues to require pressors and large volume transfusion. He presents for mesenteric arteriography and embolization of any active bleeding and prophylactic embolization of the gastroduodenal artery. EXAM: IR ULTRASOUND GUIDANCE VASC ACCESS RIGHT; ADDITIONAL ARTERIOGRAPHY; IR EMBO ART VEN HEMORR LYMPH EXTRAV INC GUIDE ROADMAPPING; SELECTIVE VISCERAL ARTERIOGRAPHY 1. Ultrasound-guided vascular access 2. Catheterization of the left gastric artery where it arises directly from the aorta with arteriogram 3. Catheterization of the celiac artery with arteriogram 4. Catheterization of the common hepatic artery with arteriogram 5. Catheterization of the gastroduodenal artery with arteriogram 6. Coil embolization of the gastroduodenal artery 7. Catheterization of the superior mesenteric artery with arteriogram MEDICATIONS: None ANESTHESIA/SEDATION: Patient intubated and on propofol drip CONTRAST:  70  mL Isovue 370 FLUOROSCOPY TIME:  Fluoroscopy Time: 11 minutes 30 seconds (487 mGy). COMPLICATIONS: None immediate. PROCEDURE: Informed consent was obtained from the patient following explanation of the procedure, risks, benefits and alternatives. The patient understands, agrees and consents for the procedure. All questions were addressed. A time out was performed prior to the initiation of the procedure. Maximal barrier sterile technique utilized including caps, mask, sterile gowns, sterile gloves, large sterile drape, hand hygiene, and Betadine prep. The right common femoral artery was interrogated with ultrasound and found to be widely patent. An image was obtained and stored for the medical record. Local anesthesia was attained by infiltration with 1% lidocaine. A small dermatotomy was made. Under real-time sonographic guidance, the vessel was punctured with a 21 gauge micropuncture needle. Using standard technique, the initial micro needle was exchanged over a 0.018 micro wire for a transitional  4 [place] micro sheath. The micro sheath was then exchanged over a 0.035 wire for a 5 French vascular sheath. A C2 cobra catheter was advanced in the abdominal aorta over a Bentson wire. The catheter was used to select the first vessel arising from the ventral aorta. Contrast was injected. This is a left gastric artery arising directly from the aorta. Anatomy is normal. No evidence of active bleeding. The Cobra catheter was then used to select the origin of the celiac artery. Arteriography was performed. The splenic artery is small in caliber. The hepatic artery demonstrates normal hepatic arterial anatomy. The gastroduodenal artery is slightly irregular with spasm in the proximal segment. No evidence of active extravasation. A glidewire was successfully advanced into the common hepatic artery and the C2 cobra catheter was advanced into the common hepatic artery. Additional arteriography was performed in multiple obliquities. Again, the proximal gastroduodenal artery is irregular and spasmodic. No evidence of dissection, aneurysm or active bleeding. A Lantern microcatheter was then advanced over a Fathom 16 wire and used to select the distal aspect of the gastroduodenal artery. Arteriography was performed. The gastroepiploic arteries are widely patent. No evidence of active bleeding. Coil embolization was then performed using a 4 mm Ruby POD detachable micro coil followed by a 45 cm Ruby packing coil. Post embolization arteriography demonstrates complete occlusion of the vessel with stasis in the proximal segment. The Lantern microcatheter was brought back into the common hepatic artery and arteriography was performed. Again, complete occlusion of the gastroduodenal artery. The right gastric artery is visualized. No evidence of active bleeding from the right gastric artery. The microcatheter was removed. The C2 cobra catheter was used to select the superior mesenteric artery. Arteriography was performed. Patent  pancreaticoduodenal arcade supplying the gastroepiploic artery. No evidence of active hemorrhage. The C2 cobra catheter was removed. A limited right common femoral arteriogram was performed confirming common femoral arterial access. Hemostasis was attained with the assistance of a Cordis ExoSeal extra arterial vascular plug. IMPRESSION: 1. No evidence of active hemorrhage. 2. 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 3. Prophylactic coil embolization of the gastroduodenal artery. Signed, Criselda Peaches, MD, [place] [facility] Radiology Electronically Signed   By: Jacqulynn Cadet M.D.   On: 09/07/2018 22:32   Ir Angiogram Selective Each Additional Vessel  Result Date: 09/07/2018 INDICATION: 57 year old male with large volume upper GI bleed secondary to a very large duodenal ulcer. Patient underwent endoscopic intervention but continues to require pressors and large volume transfusion. He presents for mesenteric arteriography and embolization of any active bleeding and prophylactic embolization of the gastroduodenal artery.  EXAM: IR ULTRASOUND GUIDANCE VASC ACCESS RIGHT; ADDITIONAL ARTERIOGRAPHY; IR EMBO ART VEN HEMORR LYMPH EXTRAV INC GUIDE ROADMAPPING; SELECTIVE VISCERAL ARTERIOGRAPHY 1. Ultrasound-guided vascular access 2. Catheterization of the left gastric artery where it arises directly from the aorta with arteriogram 3. Catheterization of the celiac artery with arteriogram 4. Catheterization of the common hepatic artery with arteriogram 5. Catheterization of the gastroduodenal artery with arteriogram 6. Coil embolization of the gastroduodenal artery 7. Catheterization of the superior mesenteric artery with arteriogram MEDICATIONS: None ANESTHESIA/SEDATION: Patient intubated and on propofol drip CONTRAST:  70 mL Isovue 370 FLUOROSCOPY TIME:  Fluoroscopy Time: 11 minutes 30 seconds (487 mGy). COMPLICATIONS: None immediate.  PROCEDURE: Informed consent was obtained from the patient following explanation of the procedure, risks, benefits and alternatives. The patient understands, agrees and consents for the procedure. All questions were addressed. A time out was performed prior to the initiation of the procedure. Maximal barrier sterile technique utilized including caps, mask, sterile gowns, sterile gloves, large sterile drape, hand hygiene, and Betadine prep. The right common femoral artery was interrogated with ultrasound and found to be widely patent. An image was obtained and stored for the medical record. Local anesthesia was attained by infiltration with 1% lidocaine. A small dermatotomy was made. Under real-time sonographic guidance, the vessel was punctured with a 21 gauge micropuncture needle. Using standard technique, the initial micro needle was exchanged over a 0.018 micro wire for a transitional 4 [place] micro sheath. The micro sheath was then exchanged over a 0.035 wire for a 5 French vascular sheath. A C2 cobra catheter was advanced in the abdominal aorta over a Bentson wire. The catheter was used to select the first vessel arising from the ventral aorta. Contrast was injected. This is a left gastric artery arising directly from the aorta. Anatomy is normal. No evidence of active bleeding. The Cobra catheter was then used to select the origin of the celiac artery. Arteriography was performed. The splenic artery is small in caliber. The hepatic artery demonstrates normal hepatic arterial anatomy. The gastroduodenal artery is slightly irregular with spasm in the proximal segment. No evidence of active extravasation. A glidewire was successfully advanced into the common hepatic artery and the C2 cobra catheter was advanced into the common hepatic artery. Additional arteriography was performed in multiple obliquities. Again, the proximal gastroduodenal artery is irregular and spasmodic. No evidence of dissection, aneurysm or  active bleeding. A Lantern microcatheter was then advanced over a Fathom 16 wire and used to select the distal aspect of the gastroduodenal artery. Arteriography was performed. The gastroepiploic arteries are widely patent. No evidence of active bleeding. Coil embolization was then performed using a 4 mm Ruby POD detachable micro coil followed by a 45 cm Ruby packing coil. Post embolization arteriography demonstrates complete occlusion of the vessel with stasis in the proximal segment. The Lantern microcatheter was brought back into the common hepatic artery and arteriography was performed. Again, complete occlusion of the gastroduodenal artery. The right gastric artery is visualized. No evidence of active bleeding from the right gastric artery. The microcatheter was removed. The C2 cobra catheter was used to select the superior mesenteric artery. Arteriography was performed. Patent pancreaticoduodenal arcade supplying the gastroepiploic artery. No evidence of active hemorrhage. The C2 cobra catheter was removed. A limited right common femoral arteriogram was performed confirming common femoral arterial access. Hemostasis was attained with the assistance of a Cordis ExoSeal extra arterial vascular plug. IMPRESSION: 1. No evidence of active hemorrhage. 2. Irregular/spastic segment of the proximal gastroduodenal  artery. 3. Prophylactic coil embolization of the gastroduodenal artery. Signed, Criselda Peaches, MD, [place] [facility] Radiology Electronically Signed   By: Jacqulynn Cadet M.D.   On: 09/07/2018 22:32   Ir US Guide Vasc Access Right  Result Date: 09/07/2018 INDICATION: 57 year old male with large volume upper GI bleed secondary to a very large duodenal ulcer. Patient underwent endoscopic intervention but continues to require pressors and large volume transfusion. He presents for mesenteric arteriography and embolization of any active bleeding and  prophylactic embolization of the gastroduodenal artery. EXAM: IR ULTRASOUND GUIDANCE VASC ACCESS RIGHT; ADDITIONAL ARTERIOGRAPHY; IR EMBO ART VEN HEMORR LYMPH EXTRAV INC GUIDE ROADMAPPING; SELECTIVE VISCERAL ARTERIOGRAPHY 1. Ultrasound-guided vascular access 2. Catheterization of the left gastric artery where it arises directly from the aorta with arteriogram 3. Catheterization of the celiac artery with arteriogram 4. Catheterization of the common hepatic artery with arteriogram 5. Catheterization of the gastroduodenal artery with arteriogram 6. Coil embolization of the gastroduodenal artery 7. Catheterization of the superior mesenteric artery with arteriogram MEDICATIONS: None ANESTHESIA/SEDATION: Patient intubated and on propofol drip CONTRAST:  70 mL Isovue 370 FLUOROSCOPY TIME:  Fluoroscopy Time: 11 minutes 30 seconds (487 mGy). COMPLICATIONS: None immediate. PROCEDURE: Informed consent was obtained from the patient following explanation of the procedure, risks, benefits and alternatives. The patient understands, agrees and consents for the procedure. All questions were addressed. A time out was performed prior to the initiation of the procedure. Maximal barrier sterile technique utilized including caps, mask, sterile gowns, sterile gloves, large sterile drape, hand hygiene, and Betadine prep. The right common femoral artery was interrogated with ultrasound and found to be widely patent. An image was obtained and stored for the medical record. Local anesthesia was attained by infiltration with 1% lidocaine. A small dermatotomy was made. Under real-time sonographic guidance, the vessel was punctured with a 21 gauge micropuncture needle. Using standard technique, the initial micro needle was exchanged over a 0.018 micro wire for a transitional 4 [place] micro sheath. The micro sheath was then exchanged over a 0.035 wire for a 5 French vascular sheath. A C2 cobra catheter was advanced in the abdominal aorta over a  Bentson wire. The catheter was used to select the first vessel arising from the ventral aorta. Contrast was injected. This is a left gastric artery arising directly from the aorta. Anatomy is normal. No evidence of active bleeding. The Cobra catheter was then used to select the origin of the celiac artery. Arteriography was performed. The splenic artery is small in caliber. The hepatic artery demonstrates normal hepatic arterial anatomy. The gastroduodenal artery is slightly irregular with spasm in the proximal segment. No evidence of active extravasation. A glidewire was successfully advanced into the common hepatic artery and the C2 cobra catheter was advanced into the common hepatic artery. Additional arteriography was performed in multiple obliquities. Again, the proximal gastroduodenal artery is irregular and spasmodic. No evidence of dissection, aneurysm or active bleeding. A Lantern microcatheter was then advanced over a Fathom 16 wire and used to select the distal aspect of the gastroduodenal artery. Arteriography was performed. The gastroepiploic arteries are widely patent. No evidence of active bleeding. Coil embolization was then performed using a 4 mm Ruby POD detachable micro coil followed by a 45 cm Ruby packing coil. Post embolization arteriography demonstrates complete occlusion of the vessel with stasis in the proximal segment. The Lantern microcatheter was brought back into the common hepatic artery and arteriography was performed. Again, complete occlusion of the gastroduodenal  artery. The right gastric artery is visualized. No evidence of active bleeding from the right gastric artery. The microcatheter was removed. The C2 cobra catheter was used to select the superior mesenteric artery. Arteriography was performed. Patent pancreaticoduodenal arcade supplying the gastroepiploic artery. No evidence of active hemorrhage. The C2 cobra catheter was removed. A limited right common femoral arteriogram  was performed confirming common femoral arterial access. Hemostasis was attained with the assistance of a Cordis ExoSeal extra arterial vascular plug. IMPRESSION: 1. No evidence of active hemorrhage. 2. 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 3. Prophylactic coil embolization of the gastroduodenal artery. Signed, Criselda Peaches, MD, [place] [facility] Radiology Electronically Signed   By: Jacqulynn Cadet M.D.   On: 09/07/2018 22:32   Dg Chest Port 1 View  Result Date: 09/09/2018 CLINICAL DATA:  Respiratory failure EXAM: PORTABLE CHEST 1 VIEW COMPARISON:  09/07/2018 FINDINGS: Endotracheal tube and nasogastric catheter have been removed in the interval. Left jugular central line is again noted in the distal superior vena cava. The cardiac shadow is stable. The lungs are well aerated bilaterally with patchy bibasilar infiltrates and small left pleural effusion. IMPRESSION: Stable bibasilar infiltrates and small left pleural effusion. Electronically Signed   By: Inez Catalina M.D.   On: 09/09/2018 07:17   Dg Chest Port 1 View  Result Date: 09/07/2018 CLINICAL DATA:  Status post fasciotomy and hematoma back UA shins in the right lower leg 2 days ago. Intubated patient. EXAM: PORTABLE CHEST 1 VIEW COMPARISON:  CT scan of the chest of September 06, 2018 FINDINGS: The lungs are well-expanded. There are patchy increased densities at both bases which are slightly more conspicuous today. There is a small right and and somewhat larger left pleural effusion. The heart and pulmonary vascularity are normal. The endotracheal tube tip projects 4.1 cm above the carina. The esophagogastric tube tip in proximal port project below the GE junction. The left internal jugular venous catheter tip projects over the midportion of the SVC. IMPRESSION: Bibasilar atelectasis or pneumonia. Small right pleural effusion and somewhat larger left pleural effusion.  The support tubes are in reasonable position. Electronically Signed   By: David  Martinique M.D.   On: 09/07/2018 13:27   Dg Abd Acute W/chest  Result Date: 08/15/2018 CLINICAL DATA:  Left-sided abdominal pain since July 22, 2018. Weakness and nausea. Vomiting beginning yesterday. Centralized chest pain. EXAM: DG ABDOMEN ACUTE W/ 1V CHEST COMPARISON:  None. FINDINGS: The heart, hila, and mediastinum are normal. No pneumothorax, pulmonary nodule, or infiltrate. No free air, portal venous gas, or pneumatosis. Moderate fecal loading in the colon. No bowel obstruction identified. A rounded calcification to the left of the L4 spinous process is noted. IMPRESSION: 1. Moderate fecal loading throughout the colon. 2. Rounded 2 mm calcification to the left of the L4 spinous process could represent bowel contents. A ureteral stone could not be excluded on this study, however. Recommend clinical correlation. If there is concern for a ureteral stone, CT imaging could better evaluate. Electronically Signed   By: Dorise Bullion III M.D   On: 08/15/2018 19:02   Ct Renal Stone Study  Result Date: 08/22/2018 CLINICAL DATA:  Left flank pain. Nausea vomiting for 4 days. Previous appendectomy. EXAM: CT ABDOMEN AND PELVIS WITHOUT CONTRAST TECHNIQUE: Multidetector CT imaging of the abdomen and pelvis was performed following the standard protocol without IV contrast. COMPARISON:  CT scan December 19, 2017 FINDINGS: Lower chest: No acute abnormality. Hepatobiliary: No focal liver abnormality  is seen. No gallstones, gallbladder wall thickening, or biliary dilatation. Pancreas: Unremarkable. No pancreatic ductal dilatation or surrounding inflammatory changes. Spleen: Normal in size without focal abnormality. Adrenals/Urinary Tract: Adrenal glands are normal. A 3 mm stone is again noted in the mid right kidney. No hydronephrosis or perinephric stranding. Bilateral extrarenal pelvises. No ureterectasis or ureteral stones noted. The  bladder is unremarkable. Stomach/Bowel: The stomach is distended. Small bowel is normal. Moderate to severe fecal loading throughout the colon with a stool ball in the rectum. By report, the patient is status post appendectomy. 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 Atherosclerotic changes seen in the aorta, iliac vessels, and femoral vessels. Reproductive: Prostate is unremarkable. Other: No abdominal wall hernia or abnormality. No abdominopelvic ascites. Musculoskeletal: No acute or significant osseous findings. IMPRESSION: 1. Nonobstructive 3 mm stone in the right kidney. No renal obstruction or other stones noted. 2. Gastric distention of uncertain etiology. 3. Moderate to severe fecal loading throughout the colon. 4. Atherosclerotic changes throughout the abdominal aorta and iliac vessels. 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 Recommend followup by ultrasound in 3 years. This recommendation follows ACR consensus guidelines: White Paper of the ACR Incidental Findings Committee II on Vascular Findings. Joellyn Rued Radiol 2013; [id number] Electronically Signed   By: Dorise Bullion III M.D   On: 08/22/2018 19:54   Ir Embo Art  [place] Guide Roadmapping  Result Date: 09/07/2018 INDICATION: 57 year old male with large volume upper GI bleed secondary to a very large duodenal ulcer. Patient underwent endoscopic intervention but continues to require pressors and large volume transfusion. He presents for mesenteric arteriography and embolization of any active bleeding and prophylactic embolization of the gastroduodenal artery. EXAM: IR ULTRASOUND GUIDANCE VASC ACCESS  RIGHT; ADDITIONAL ARTERIOGRAPHY; IR EMBO ART VEN HEMORR LYMPH EXTRAV INC GUIDE ROADMAPPING; SELECTIVE VISCERAL ARTERIOGRAPHY 1. Ultrasound-guided vascular access 2. Catheterization of the left gastric artery where it arises directly from the aorta with arteriogram 3. Catheterization of the celiac artery with arteriogram 4. Catheterization of the common hepatic artery with arteriogram 5. Catheterization of the gastroduodenal artery with arteriogram 6. Coil embolization of the gastroduodenal artery 7. Catheterization of the superior mesenteric artery with arteriogram MEDICATIONS: None ANESTHESIA/SEDATION: Patient intubated and on propofol drip CONTRAST:  70 mL Isovue 370 FLUOROSCOPY TIME:  Fluoroscopy Time: 11 minutes 30 seconds (487 mGy). COMPLICATIONS: None immediate. PROCEDURE: Informed consent was obtained from the patient following explanation of the procedure, risks, benefits and alternatives. The patient understands, agrees and consents for the procedure. All questions were addressed. A time out was performed prior to the initiation of the procedure. Maximal barrier sterile technique utilized including caps, mask, sterile gowns, sterile gloves, large sterile drape, hand hygiene, and Betadine prep. The right common femoral artery was interrogated with ultrasound and found to be widely patent. An image was obtained and stored for the medical record. Local anesthesia was attained by infiltration with 1% lidocaine. A small dermatotomy was made. Under real-time sonographic guidance, the vessel was punctured with a 21 gauge micropuncture needle. Using standard technique, the initial micro needle was exchanged over a 0.018 micro wire for a transitional 4 [place] micro sheath. The micro sheath was  then exchanged over a 0.035 wire for a 5 [place] vascular sheath. A C2 cobra catheter was advanced in the abdominal aorta over a Bentson wire. The catheter was used to select the first vessel arising from the ventral aorta.  Contrast was injected. This is a left gastric artery arising directly from the aorta. Anatomy is normal. No evidence of active bleeding. The Cobra catheter was then used to select the origin of the celiac artery. Arteriography was performed. The splenic artery is small in caliber. The hepatic artery demonstrates normal hepatic arterial anatomy. The gastroduodenal artery is slightly irregular with spasm in the proximal segment. No evidence of active extravasation. A glidewire was successfully advanced into the common hepatic artery and the C2 cobra catheter was advanced into the common hepatic artery. Additional arteriography was performed in multiple obliquities. Again, the proximal gastroduodenal artery is irregular and spasmodic. No evidence of dissection, aneurysm or active bleeding. A Lantern microcatheter was then advanced over a Fathom 16 wire and used to select the distal aspect of the gastroduodenal artery. Arteriography was performed. The gastroepiploic arteries are widely patent. No evidence of active bleeding. Coil embolization was then performed using a 4 mm Ruby POD detachable micro coil followed by a 45 cm Ruby packing coil. Post embolization arteriography demonstrates complete occlusion of the vessel with stasis in the proximal segment. The Lantern microcatheter was brought back into the common hepatic artery and arteriography was performed. Again, complete occlusion of the gastroduodenal artery. The right gastric artery is visualized. No evidence of active bleeding from the right gastric artery. The microcatheter was removed. The C2 cobra catheter was used to select the superior mesenteric artery. Arteriography was performed. Patent pancreaticoduodenal arcade supplying the gastroepiploic artery. No evidence of active hemorrhage. The C2 cobra catheter was removed. A limited right common femoral arteriogram was performed confirming common femoral arterial access. Hemostasis was attained with the  assistance of a Cordis ExoSeal extra arterial vascular plug. IMPRESSION: 1. No evidence of active hemorrhage. 2. 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 3. Prophylactic coil embolization of the gastroduodenal artery. Signed, Criselda Peaches, MD, [place] [facility] Electronically Signed   By: Jacqulynn Cadet M.D.   On: 09/07/2018 22:32     LOS: 7 days   Oren Binet, MD  [facility]  If 7PM-7AM, please contact night-coverage  Please page via [email or web address]-click on MD name and type text message  09/10/2018, 2:04 PM

## 2018-09-10 NOTE — Anesthesia Procedure Notes (Signed)
Procedure Name: LMA Insertion Date/Time: 09/10/2018 1:37 PM Performed by: Gwyndolyn Saxon, CRNA Pre-anesthesia Checklist: Patient identified, Emergency Drugs available, Suction available and Patient being monitored Patient Re-evaluated:Patient Re-evaluated prior to induction Oxygen Delivery Method: Circle system utilized Preoxygenation: Pre-oxygenation with 100% oxygen Induction Type: IV induction Ventilation: Mask ventilation without difficulty LMA: LMA inserted LMA Size: 4.0 Number of attempts: 1 Airway Equipment and Method: Patient positioned with wedge pillow Placement Confirmation: positive ETCO2 and breath sounds checked- equal and bilateral Tube secured with: Tape Dental Injury: Teeth and Oropharynx as per pre-operative assessment

## 2018-09-10 NOTE — Progress Notes (Addendum)
Daily Rounding Note  09/10/2018, 11:37 AM  LOS: 7 days   SUBJECTIVE:   Chief complaint: GI bleed,  Bulbar ulcer   Stools dark, mushy.  Rectal pouch removed. No hypotension or tachycardia.  No abd pain, no nausea.   Tolerating clears.   Went to surgery for closure of fasciotomy.    OBJECTIVE:         Vital signs in last 24 hours:    Temp:  [97.9 F (36.6 C)-98.7 F (37.1 C)] 98.1 F (36.7 C) (12/20 0648) Pulse Rate:  [69-89] 89 (12/20 0648) Resp:  [16-17] 16 (12/20 0648) BP: (116-127)/(80-90) 124/85 (12/20 0648) SpO2:  [100 %] 100 % (12/20 0648) Last BM Date: 09/09/18 Filed Weights   09/03/18 1650  Weight: 66.7 kg   Not re-examined.    Intake/Output from previous day: 12/19 0701 - 12/20 0700 In: 3481.6 [P.O.:300; I.V.:2675.8; Blood:369; IV Piggyback:136.8] Out: 2700 [Urine:2700]  Intake/Output this shift: No intake/output data recorded.  Lab Results: Recent Labs    09/08/18 2326  09/09/18 0711 09/09/18 1650 09/10/18 0412  WBC 13.4*  --  12.4*  --  10.4  HGB 7.0*   < > 7.3* 9.2* 9.1*  HCT 21.4*   < > 22.3* 27.1* 26.7*  PLT 146*  --  140*  --  151   < > = values in this interval not displayed.   BMET Recent Labs    09/08/18 0500 09/09/18 0711 09/10/18 0412  NA 141 133* 133*  K 4.0 3.5 3.3*  CL 110 103 102  CO2 _0 GLUCOSE 94 290* 221*  BUN 32* 15 10  CREATININE 0.69 0.60* 0.57*  CALCIUM 7.3* 6.8* 7.0*   LFT Recent Labs    09/07/18 1800 09/08/18 0500 09/09/18 0711 09/10/18 0412  PROT 3.3*  --  3.5* 3.8*  ALBUMIN 1.4* 1.5* 1.3* 1.3*  AST 26  --  32 23  ALT 15  --  19 19  ALKPHOS 24*  --  44 52  BILITOT 0.6  --  0.7 0.6   PT/INR Recent Labs    09/07/18 1633  LABPROT 15.9*  INR 1.28   Hepatitis Panel No results for input(s): HEPBSAG, HCVAB, HEPAIGM, HEPBIGM in the last 72 hours.  Studies/Results: Dg Chest Port 1 View  Result Date: 09/09/2018 CLINICAL DATA:   Respiratory failure EXAM: PORTABLE CHEST 1 VIEW COMPARISON:  09/07/2018 FINDINGS: Endotracheal tube and nasogastric catheter have been removed in the interval. Left jugular central line is again noted in the distal superior vena cava. The cardiac shadow is stable. The lungs are well aerated bilaterally with patchy bibasilar infiltrates and small left pleural effusion. IMPRESSION: Stable bibasilar infiltrates and small left pleural effusion. Electronically Signed   By: Inez Catalina M.D.   On: 09/09/2018 07:17   Scheduled Meds: . [MAR Hold] atorvastatin  20 mg Oral Daily  . [MAR Hold] chlorhexidine gluconate (MEDLINE KIT)  15 mL Mouth Rinse BID  . [MAR Hold] insulin aspart  0-9 Units Subcutaneous TID WC  . [MAR Hold] mouth rinse  15 mL Mouth Rinse BID   Continuous Infusions: . [MAR Hold] sodium chloride    . [MAR Hold]  ceFAZolin (ANCEF) IV    . dextrose 5 % and 0.9% NaCl 75 mL/hr at 09/10/18 1139   PRN Meds:.[MAR Hold] sodium chloride, [MAR Hold] ipratropium-albuterol, [MAR Hold] LORazepam, [MAR Hold] ondansetron **OR** [MAR Hold] ondansetron (ZOFRAN) IV, [MAR Hold] oxyCODONE-acetaminophen, [MAR Hold] polyethylene glycol, [MAR  Hold] potassium chloride, [MAR Hold] senna-docusate, [MAR Hold] sodium chloride flush   ASSESMENT:   *Acute UGU hemorrhage in setting of now d/cd IV Heparin EGD 09/08/18: severe esophagitis, duodenal bulb ulcer and overlying clot treated with hemospray.  12/18 mesenteric angio with GDA embolization Remains on PPI drip day 3 of 3.  Dark stools overnight.  Looks like old blood to me.  * Blood loss anemia. S/p PRBCs x 10, FFP x 1, platelets x 1. Hgb improved.  Hgb 7.2 >> 9.1 after latest PRBCs (2 on 09/10/18)    * S/p Right LE thrombolectomy and fasciotomy.  Plan for surgery for fasciotomy closure today.     PLAN   *  Follow CBC.   Dark stools should eventually resolve.  *  Advance full liquids after surgery, carb mod diet in AM.    *   Protonix  40 mg po BID.    *   ? When ok to restart AC (Heparin, Coumadin etc).  Does he need relook EGD next week to assess bleeding risk, perhaps biopsy?     Azucena Freed  09/10/2018, 11:37 AM Phone [phone]

## 2018-09-10 NOTE — Anesthesia Postprocedure Evaluation (Signed)
Anesthesia Post Note  Patient: Ryan Medina  Procedure(s) Performed: FASCIOTOMY CLOSURE FOUR COMPARTMENT (Right ) EMBOLECTOMY RIGHT LOWER EXTREMITY (Right )     Patient location during evaluation: PACU Anesthesia Type: General Level of consciousness: awake and alert Pain management: pain level controlled Vital Signs Assessment: post-procedure vital signs reviewed and stable Respiratory status: spontaneous breathing, nonlabored ventilation and respiratory function stable Cardiovascular status: blood pressure returned to baseline and stable Postop Assessment: no apparent nausea or vomiting Anesthetic complications: no    Last Vitals:  Vitals:   09/10/18 1600 09/10/18 1624  BP: (!) 116/96 116/88  Pulse: 100 80  Resp: 13   Temp:    SpO2: 100%     Last Pain:  Vitals:   09/10/18 1545  TempSrc:   PainSc: [place]

## 2018-09-10 NOTE — Transfer of Care (Signed)
Immediate Anesthesia Transfer of Care Note  Patient: Ryan Medina  Procedure(s) Performed: FASCIOTOMY CLOSURE FOUR COMPARTMENT (Right ) EMBOLECTOMY RIGHT LOWER EXTREMITY (Right )  Patient Location: PACU  Anesthesia Type:General  Level of Consciousness: awake, alert  and oriented  Airway & Oxygen Therapy: Patient Spontanous Breathing and Patient connected to nasal cannula oxygen  Post-op Assessment: Report given to RN and Post -op Vital signs reviewed and stable  Post vital signs: Reviewed and stable  Last Vitals:  Vitals Value Taken Time  BP 109/82 09/10/2018  3:14 PM  Temp    Pulse 93 09/10/2018  3:16 PM  Resp 15 09/10/2018  3:16 PM  SpO2 100 % 09/10/2018  3:16 PM  Vitals shown include unvalidated device data.  Last Pain:  Vitals:   09/10/18 0648  TempSrc: Oral  PainSc:       Patients Stated Pain Goal: 0 (15/37/94 3276)  Complications: No apparent anesthesia complications

## 2018-09-10 NOTE — Progress Notes (Signed)
PT Cancellation Note  Patient Details Name: Ryan Medina MRN: [id number] DOB: 1960-11-07   Cancelled Treatment:     Attempted to see patient for initial evaluation. Patient currently in surgery for wound closure. Therapy will F/U this afternoon or on 09/11/2018 depending on when patient is out of surgery and MD orders.    Carney Living PT DPT  09/10/2018, 1:04 PM

## 2018-09-10 NOTE — Interval H&P Note (Signed)
History and Physical Interval Note:  09/10/2018 1:14 PM  Ryan Medina  has presented today for surgery, with the diagnosis of FASCIOTOMY  The various methods of treatment have been discussed with the patient and family. After consideration of risks, benefits and other options for treatment, the patient has consented to  Procedure(s): FASCIOTOMY CLOSURE FOUR COMPARTMENT (Right) as a surgical intervention .  The patient's history has been reviewed, patient examined, no change in status, stable for surgery.  I have reviewed the patient's chart and labs.  Questions were answered to the patient's satisfaction.     [name]

## 2018-09-11 ENCOUNTER — Inpatient Hospital Stay (HOSPITAL_COMMUNITY): Payer: Medicaid Other

## 2018-09-11 ENCOUNTER — Encounter (HOSPITAL_COMMUNITY): Payer: Self-pay | Admitting: Vascular Surgery

## 2018-09-11 LAB — CBC
HCT: 29.3 % — ABNORMAL LOW (ref 39.0–52.0)
Hemoglobin: 10 g/dL — ABNORMAL LOW (ref 13.0–17.0)
MCH: 29 pg (ref 26.0–34.0)
MCHC: 34.1 g/dL (ref 30.0–36.0)
MCV: 84.9 fL (ref 80.0–100.0)
Platelets: 208 10*3/uL (ref 150–400)
RBC: 3.45 MIL/uL — ABNORMAL LOW (ref 4.22–5.81)
RDW: 14.4 % (ref 11.5–15.5)
WBC: 11 10*3/uL — ABNORMAL HIGH (ref 4.0–10.5)
nRBC: 0 % (ref 0.0–0.2)

## 2018-09-11 LAB — PREPARE FRESH FROZEN PLASMA

## 2018-09-11 LAB — GLUCOSE, CAPILLARY
Glucose-Capillary: 243 mg/dL — ABNORMAL HIGH (ref 70–99)
Glucose-Capillary: 147 mg/dL — ABNORMAL HIGH (ref 70–99)
Glucose-Capillary: 196 mg/dL — ABNORMAL HIGH (ref 70–99)
Glucose-Capillary: 212 mg/dL — ABNORMAL HIGH (ref 70–99)

## 2018-09-11 LAB — BASIC METABOLIC PANEL
Anion gap: 10 (ref 5–15)
BUN: 11 mg/dL (ref 6–20)
CO2: 23 mmol/L (ref 22–32)
Calcium: 7 mg/dL — ABNORMAL LOW (ref 8.9–10.3)
Chloride: 100 mmol/L (ref 98–111)
Creatinine, Ser: 0.6 mg/dL — ABNORMAL LOW (ref 0.61–1.24)
GFR calc Af Amer: >60 mL/min (ref >60)
GFR calc non Af Amer: >60 mL/min (ref >60)
GLUCOSE: 294 mg/dL — AB (ref 70–99)
Potassium: 3.6 mmol/L (ref 3.5–5.1)
Sodium: 133 mmol/L — ABNORMAL LOW (ref 135–145)

## 2018-09-11 LAB — BPAM FFP
Blood Product Expiration Date: 201912202359
Blood Product Expiration Date: 201912202359
ISSUE DATE / TIME: 201912171605
Unit Type and Rh: 8400
Unit Type and Rh: 8400

## 2018-09-11 LAB — APTT: aPTT: 30 seconds (ref 24–36)

## 2018-09-11 MED ORDER — IOPAMIDOL (ISOVUE-370) INJECTION 76%
INTRAVENOUS | Status: AC
  Filled 2018-09-11: qty 100

## 2018-09-11 MED ORDER — MORPHINE SULFATE (PF) 2 MG/ML IV SOLN
2.0000 mg | Freq: Once | INTRAVENOUS | Status: AC
  Administered 2018-09-11: 2 mg via INTRAVENOUS
  Filled 2018-09-11: qty 1

## 2018-09-11 MED ORDER — IOPAMIDOL (ISOVUE-370) INJECTION 76%
100.0000 mL | Freq: Once | INTRAVENOUS | Status: AC | PRN
  Administered 2018-09-11: 100 mL via INTRAVENOUS

## 2018-09-11 MED ORDER — TAMSULOSIN HCL 0.4 MG PO CAPS
0.4000 mg | ORAL_CAPSULE | Freq: Every day | ORAL | Status: DC
  Administered 2018-09-11 – 2018-09-22 (×12): 0.4 mg via ORAL
  Filled 2018-09-11 (×13): qty 1

## 2018-09-11 NOTE — Progress Notes (Signed)
[facility] [place]  Subjective  - Closure of fasciotomies yesterday and right popliteal/tibial embolectomy.  Remains on heparin at 400 u/hr in setting of recent GI bleed.  Hgb 9.1 --> 10 today and HDS.   Objective (!) 130/103 87 97.7 F (36.5 C) (Oral) 19 99%  Intake/Output Summary (Last 24 hours) at 09/11/2018 0823 Last data filed at 09/11/2018 0427 Gross per 24 hour  Intake 635.12 ml  Output 785 ml  Net -149.88 ml   Right fasciotomies closed Brisk R PT signal biphasic, monophasic DP signal  Laboratory Lab Results: Recent Labs    09/10/18 0412 09/11/18 0410  WBC 10.4 11.0*  HGB 9.1* 10.0*  HCT 26.7* 29.3*  PLT 151 208   BMET Recent Labs    09/10/18 0412 09/11/18 0410  NA 133* 133*  K 3.3* 3.6  CL 102 100  CO2 24 23  GLUCOSE 221* 294*  BUN 10 11  CREATININE 0.57* 0.60*  CALCIUM 7.0* 7.0*    COAG Lab Results  Component Value Date   INR 1.28 09/07/2018   INR 1.03 09/03/2018   INR 1.0 05/15/2016   No results found for: PTT  Assessment/Planning:  Continue heparin at 400 u/hr given need for repeat popliteal/tibial embolectomy yesterday.  Appreciate GI input for anticoagulation in setting of recent GI bleed.  Seems to be tolerating low dose heparin.  Fasciotomies closed and will remove dressing tomorrow. Good PT signal that is biphasic and brisk.    Marty Heck 09/11/2018 8:23 AM --

## 2018-09-11 NOTE — Evaluation (Signed)
Occupational Therapy Evaluation Patient Details Name: Ryan Medina MRN: [id number] DOB: [birth year] Today's Date: 09/11/2018    History of Present Illness Patient is a 57 y.o. male with PMH including but not limited to DM and HTN who presented to the hospital on 12/13 with a right ischemic leg secondary to acute popliteal and tibial embolus, underwent embolectomy on 12/13 and subsequently was placed on a heparin drip.  He unfortunately developed a acute right lower leg hematoma with compartment syndrome requiring fasciotomy on 12/15.  Further hospital course was complicated by development of hemorrhagic shock with acute blood loss anemia secondary to a bleeding duodenal ulcer-requiring ICU transfer-intubation for airway protection and IR evaluation with mesenteric angiogram and GDA embolization.  Upon stability he was [facility]. Pt is now s/p closure of R LE fasciotomy on 09/10/18.   Clinical Impression   PTA, pt was living with his wife and was independent. Pt currently requiring Min A for UB ADLs, Mod A+2 for LB ADLs, and Mod A +2 for functional transfers with RW. Pt presenting with decreased balance, strength, and activity tolerance due to pain with RLE. Pt highly motivated to participate in therapy and increase occupational performance. Pt will require further acute OT to facilitate safe dc. Recommend dc to CIR for intensive OT to optimize safety, independence with ADLs, and return to PLOF.      Follow Up Recommendations  CIR;Supervision/Assistance - 24 hour    Equipment Recommendations  Other (comment)(Defer to next venue)    Recommendations for Other Services PT consult     Precautions / Restrictions Precautions Precautions: Fall Restrictions Weight Bearing Restrictions: Yes RLE Weight Bearing: Weight bearing as tolerated      Mobility Bed Mobility Overal bed mobility: Needs Assistance Bed Mobility: Supine to Sit;Sit to  Supine     Supine to sit: Min guard Sit to supine: Min guard   General bed mobility comments: increased time and effort, min guard for safety  Transfers Overall transfer level: Needs assistance Equipment used: Rolling walker (2 wheeled) Transfers: Sit to/from [facility] Sit to Stand: From elevated surface;Mod assist;+2 physical assistance Stand pivot transfers: Mod assist;+2 physical assistance       General transfer comment: increased time and effort, cueing for technique and safe hand placement, assist to power into standing and for pivot, assist needed for RW management as well; pt performed sit<>stand x1 from EOB and x1 from recliner chair, performed pivot x2    Balance Overall balance assessment: Needs assistance Sitting-balance support: Feet supported Sitting balance-Leahy Scale: Fair     Standing balance support: Bilateral upper extremity supported Standing balance-Leahy Scale: Poor Standing balance comment: reliant on bilateral UEs on RW                           ADL either performed or assessed with clinical judgement   ADL Overall ADL's : Needs assistance/impaired Eating/Feeding: Independent;Sitting   Grooming: Set up;Supervision/safety;Sitting   Upper Body Bathing: Minimal assistance;Sitting   Lower Body Bathing: Moderate assistance;+2 for physical assistance;Sit to/from stand   Upper Body Dressing : Minimal assistance;Sitting Upper Body Dressing Details (indicate cue type and reason): donned second gown like a jacket Lower Body Dressing: Moderate assistance;+2 for physical assistance;Sit to/from stand;Minimal assistance Lower Body Dressing Details (indicate cue type and reason): Min A to bring right ankle to left knee. Pt requiring increased effort and time for donning right sock. Pt able to  bring left ankle to right knee. Mod A +2 for sit<>stand ' Toilet Transfer: Moderate assistance;+2 for physical assistance;Stand-pivot Toilet  Transfer Details (indicate cue type and reason): Mod A +2 for power up into standing and then maintain balance duirng pivot to recliner         Functional mobility during ADLs: Moderate assistance;+2 for physical assistance General ADL Comments: Pt with decreased balance and activity tolerance. Continues to report increased pain at RLE     Vision         Perception     Praxis      Pertinent Vitals/Pain Pain Assessment: Faces Faces Pain Scale: Hurts even more Pain Location: R LE Pain Descriptors / Indicators: Sore;Guarding Pain Intervention(s): Monitored during session;Limited activity within patient's tolerance;Repositioned     Hand Dominance     Extremity/Trunk Assessment Upper Extremity Assessment Upper Extremity Assessment: Overall WFL for tasks assessed   Lower Extremity Assessment Lower Extremity Assessment: Defer to PT evaluation RLE Deficits / Details: pt with decreased strength and ROM limitations secnodary to post-op pain and weakness. Sensation decreased to light touch throughout RLE: Unable to fully assess due to pain RLE Coordination: decreased fine motor;decreased gross motor   Cervical / Trunk Assessment Cervical / Trunk Assessment: Normal   Communication Communication Communication: No difficulties   Cognition Arousal/Alertness: Awake/alert Behavior During Therapy: WFL for tasks assessed/performed Overall Cognitive Status: Within Functional Limits for tasks assessed                                     General Comments       Exercises     Shoulder Instructions      Home Living Family/patient expects to be discharged to:: Private residence Living Arrangements: Spouse/significant other Available Help at Discharge: Family Type of Home: House Home Access: Stairs to enter Technical brewer of Steps: 4 and then 4   Home Layout: One level     Bathroom Shower/Tub: Teacher, early years/pre: Standard     Home  Equipment: Cane - single point;Crutches          Prior Functioning/Environment Level of Independence: Independent with assistive device(s)        Comments: ADLs, IADLs, use of SPC for functional mobility        OT Problem List: Decreased strength;Decreased activity tolerance;Decreased range of motion;Impaired balance (sitting and/or standing);Decreased knowledge of precautions;Cardiopulmonary status limiting activity;Impaired UE functional use;Pain      OT Treatment/Interventions: Self-care/ADL training;Therapeutic exercise;Energy conservation;DME and/or AE instruction;Therapeutic activities    OT Goals(Current goals can be found in the care plan section) Acute Rehab OT Goals Patient Stated Goal: decrease pain OT Goal Formulation: With patient Time For Goal Achievement: 09/25/18 Potential to Achieve Goals: Good ADL Goals Pt Will Perform Grooming: with min assist;standing Pt Will Perform Lower Body Dressing: with min guard assist;sit to/from stand Pt Will Transfer to Toilet: with min guard assist;bedside commode;ambulating Pt Will Perform Toileting - Clothing Manipulation and hygiene: with min guard assist;sit to/from stand;sitting/lateral leans  OT Frequency: Min 2X/week   Barriers to D/C:            Co-evaluation PT/OT/SLP Co-Evaluation/Treatment: Yes Reason for Co-Treatment: For patient/therapist safety;To address functional/ADL transfers PT goals addressed during session: Mobility/safety with mobility;Balance;Proper use of DME;Strengthening/ROM OT goals addressed during session: ADL's and self-care      AM-PAC OT "6 Clicks" Daily Activity     Outcome Measure Help  from another person eating meals?: None Help from another person taking care of personal grooming?: A Little Help from another person toileting, which includes using toliet, bedpan, or urinal?: A Lot Help from another person bathing (including washing, rinsing, drying)?: A Lot Help from another person to  put on and taking off regular upper body clothing?: A Little Help from another person to put on and taking off regular lower body clothing?: A Lot 6 Click Score: 16   End of Session Equipment Utilized During Treatment: Gait belt;Rolling walker Nurse Communication: Mobility status  Activity Tolerance: Patient tolerated treatment well;Patient limited by pain Patient left: with call bell/phone within reach;in bed  OT Visit Diagnosis: Unsteadiness on feet (R26.81);Other abnormalities of gait and mobility (R26.89);Muscle weakness (generalized) (M62.81);Pain Pain - Right/Left: Right Pain - part of body: Leg                Time: 1010-1034 OT Time Calculation (min): 24 min Charges:  OT General Charges $OT Visit: 1 Visit OT Evaluation $OT Eval Moderate Complexity: [place], OTR/L Acute Rehab Pager: [phone] Office: [place] 09/11/2018, 3:52 PM

## 2018-09-11 NOTE — Progress Notes (Signed)
Rehab Admissions Coordinator Note:  Per PT recommendation, patient was screened by Jhonnie Garner for appropriateness for an Inpatient Acute Rehab Consult.  At this time, we are recommending an Inpatient Rehab consult. AC will contact MD to request an IP Rehab Consult Order.  Jhonnie Garner 09/11/2018, 3:03 PM  I can be reached at [phone].

## 2018-09-11 NOTE — Progress Notes (Signed)
Informed Dr. Carlis Abbott that physical therapy is requesting a weight bearing order. Per MD patient is weight bearing as tolerated.

## 2018-09-11 NOTE — Progress Notes (Addendum)
PROGRESS NOTE        PATIENT DETAILS Name: Ryan Medina Age: 57 y.o. Sex: male Date of Birth: 23-Dec-1960 Admit Date: 09/03/2018 Admitting Physician [name], MD [id number], No Pcp Per  Brief Narrative: Patient is a 57 y.o. male with history of cocaine use, chronic systolic heart failure presented to the hospital on 12/13 with a right ischemic leg secondary to acute popliteal and tibial embolus, underwent embolectomy on 12/13 and subsequently was placed on a heparin drip.  He unfortunately developed a acute right lower leg hematoma with compartment syndrome requiring fasciotomy on 12/15.  Further hospital course was complicated by development of hemorrhagic shock with acute blood loss anemia secondary to a bleeding duodenal ulcer-requiring ICU transfer-intubation for airway protection and IR evaluation with mesenteric angiogram and GDA embolization.  Upon stability he was transferred back to the triad hospitalist service on 12/19.  See below for further details.  Subjective:  Patient in bed, appears comfortable, denies any headache, no fever, no chest pain or pressure, no shortness of breath , no abdominal pain. No focal weakness.    Assessment/Plan:  Right ischemic leg secondary to popliteal artery/tibial artery embolism-s/p embolectomy on 39/53 complicated by right lower extremity hematoma with compartment syndrome requiring fasciotomy on 12/15:   Source of embolization unclear, required fasciotomy on 09/05/2018 by vascular surgery status post closure of fasciotomy on 09/10/2018.  His course was complicated by large hematoma in the right leg along with significant life-threatening GI bleed.  Was initially on heparin drip but currently on prophylactic heparin due to GI bleed and right leg hematoma.  Vascular surgery following, gradual recovery, still no clear source of his hematoma TEE requested on 09/11/2018.  Also questionable new pancreatic mass which  potentially could be hypercoagulable.  We will continue to monitor.  Will definitely require outpatient hematology follow-up.  Currently on full dose heparin per vascular surgery started on 09/10/2018.   Upper GI bleeding with hemorrhagic shock and acute blood loss anemia: Developed hemorrhagic shock, was seen by GI, required multiple PRBC transfusions last transfusion on 09/09/2018.  Was seen by GI and eventually underwent gastroduodenal artery embolization on 09/07/2018.  Total 8 units of packed RBC, 1 unit of FFP and 1 unit of platelets transfused.  Currently H&H seems to have stabilized.  H&H closely on full dose anticoagulation which was resumed by vascular surgery on 09/10/2018.  GI note noted from 09/10/2018 (okay with heparin challenge).   Acute hypoxic respiratory failure: Intubated when he developed hemorrhagic shock secondary to GI bleeding, successfully extubated-currently stable on 2 L of oxygen via nasal cannula.  Chronic systolic heart failure (EF 45-50% by TTE on 09/04/2018): Reasonably well compensated-follow weights, volume status, electrolytes closely.  Cocaine use/tobacco use: Counseled-I am still not sure if he has any intention of quitting.  3 cm AAA: Stable for follow-up in the outpatient setting  ? new pancreatic mass noted on CT scan on 09/11/2018.  MRI ordered.  Insulin-dependent DM-2: Last A1c on 12/2 was 18.2.  Since he was n.p.o.-and on a clear liquid diet-he was allowed to have mild hyperglycemia-since CBGs are now stable-we will start low-dose Lantus at 10 units nightly.  Follow and adjust accordingly.  CBG (last 3)  Recent Labs    09/10/18 2243 09/11/18 0803 09/11/18 1246  GLUCAP 243* 243* 147*      DVT Prophylaxis: Heparin full  dose started on 09/10/2018 by vascular surgery  Code Status: Full code   Family Communication: None at bedside  Disposition Plan: Remain inpatient-requires several more days of hospitalization before consideration of  discharge  Antimicrobial agents: Anti-infectives (From admission, onward)   Start     Dose/Rate Route Frequency Ordered Stop   09/10/18 2130  ceFAZolin (ANCEF) IVPB 2g/100 mL premix     2 g 200 mL/hr over 30 Minutes Intravenous Every 8 hours 09/10/18 1634 09/11/18 0609   09/10/18 0800  ceFAZolin (ANCEF) IVPB 1 g/50 mL premix    Note to Pharmacy:  Send with pt to OR   1 g 100 mL/hr over 30 Minutes Intravenous On call 09/09/18 0756 09/10/18 1407   09/07/18 1300  erythromycin 250 mg in sodium chloride 0.9 % 100 mL IVPB     250 mg 100 mL/hr over 60 Minutes Intravenous Once 09/07/18 1125 09/07/18 2351   09/06/18 0000  ceFAZolin (ANCEF) IVPB 1 g/50 mL premix    Note to Pharmacy:  Send with pt to OR   1 g 100 mL/hr over 30 Minutes Intravenous On call 09/05/18 0849 09/05/18 0952   09/03/18 2245  ceFAZolin (ANCEF) IVPB 2g/100 mL premix     2 g 200 mL/hr over 30 Minutes Intravenous Every 8 hours 09/03/18 2236 09/04/18 1444   09/03/18 1615  ceFAZolin (ANCEF) IVPB 2g/100 mL premix  Status:  Discontinued     2 g 200 mL/hr over 30 Minutes Intravenous On call to O.R. 09/03/18 1600 09/03/18 2035      Procedures: 12/17>>Mesenteric angiogram and coil embolization of the GDA  12/17>>EGD  12/15>> 1.  Evacuation hematoma right leg 2.  Evacuation of lymphocele right leg 3.  4 compartment fasciotomy 4.  Placement of VAC (medial and lateral)  12/13>> 1.  Ultrasound-guided access to the left common femoral artery 2.  Aortogram with bilateral iliac arteriogram 3.  Selective catheterization of the right external iliac artery with right lower extremity runoff   CONSULTS:  pulmonary/intensive care, vascular surgery and IR  Time spent: 40 minutes-Greater than 50% of this time was spent in counseling, explanation of diagnosis, planning of further management, and coordination of care.  MEDICATIONS: Scheduled Meds: . atorvastatin  20 mg Oral Daily  . chlorhexidine gluconate (MEDLINE KIT)  15 mL  Mouth Rinse BID  . insulin aspart  0-9 Units Subcutaneous TID WC  . iopamidol      . mouth rinse  15 mL Mouth Rinse BID  . pantoprazole  40 mg Oral BID   Continuous Infusions: . sodium chloride    . dextrose 5 % and 0.9% NaCl 75 mL/hr at 09/11/18 1100  . heparin 400 Units/hr (09/11/18 1100)   PRN Meds:.sodium chloride, ipratropium-albuterol, LORazepam, ondansetron **OR** ondansetron (ZOFRAN) IV, oxyCODONE-acetaminophen, polyethylene glycol, potassium chloride, senna-docusate, sodium chloride flush   PHYSICAL EXAM: Vital signs: Vitals:   09/10/18 1600 09/10/18 1624 09/10/18 2201 09/11/18 0521  BP: (!) 116/96 116/88 (!) 127/103 (!) 130/103  Pulse: 100 80 71 87  Resp: 13  18 19   Temp:   97.9 F (36.6 C) 97.7 F (36.5 C)  TempSrc:   Oral Oral  SpO2: 100%  100% 99%  Weight:      Height:       Filed Weights   09/03/18 1650  Weight: 66.7 kg   Body mass index is 20.51 kg/m.   Exam  Awake Alert, Oriented X 3, No new F.N deficits, Normal affect [place].AT,PERRAL Supple Neck,No JVD, No cervical lymphadenopathy appriciated.  Symmetrical Chest wall movement, Good air movement bilaterally, CTAB RRR,No Gallops, Rubs or new Murmurs, No Parasternal Heave +ve B.Sounds, Abd Soft, No tenderness, No organomegaly appriciated, No rebound - guarding or rigidity. No Cyanosis, Clubbing or edema, No new Rash or bruise   I have personally reviewed following labs and imaging studies  LABORATORY DATA: CBC: Recent Labs  Lab 09/07/18 1633 09/08/18 0255 09/08/18 1454 09/08/18 2326 09/09/18 0437 09/09/18 0711 09/09/18 1650 09/10/18 0412 09/11/18 0410  WBC 10.8* 11.3* 12.3* 13.4*  --  12.4*  --  10.4 11.0*  NEUTROABS 10.4* 9.5* 11.2* 11.5*  --  11.5*  --   --   --   HGB 6.8* 8.7* 7.9* 7.0* 7.2* 7.3* 9.2* 9.1* 10.0*  HCT 20.3* 25.9* 24.5* 21.4* 21.7* 22.3* 27.1* 26.7* 29.3*  MCV 84.6 85.8 85.4 83.9  --  84.8  --  84.8 84.9  PLT 216 231 165 146*  --  140*  --  151 545    Basic Metabolic  Panel: Recent Labs  Lab 09/06/18 0233 09/07/18 0349  09/08/18 0255 09/08/18 0500 09/09/18 0437 09/09/18 0711 09/10/18 0412 09/11/18 0410  NA 137 136   < > 140 141  --  133* 133* 133*  K 3.8 3.9   < > 3.9 4.0  --  3.5 3.3* 3.6  CL 88* 96*   < > 108 110  --  103 102 100  CO2 36* 30   < > 25 25  --  24 24 23   GLUCOSE 242* 112*   < > 94 94  --  290* 221* 294*  BUN 8 26*   < > 31* 32*  --  15 10 11   CREATININE 0.83 0.78   < > 0.75 0.69  --  0.60* 0.57* 0.60*  CALCIUM 8.1* 7.2*   < > 7.2* 7.3*  --  6.8* 7.0* 7.0*  MG 2.1 2.0  --   --  2.1 1.8  --  1.8  --   PHOS  --   --   --   --  2.8 2.0*  --  2.3*  --    < > = values in this interval not displayed.    GFR: Estimated Creatinine Clearance: 96.1 mL/min (A) (by C-G formula based on SCr of 0.6 mg/dL (L)).  Liver Function Tests: Recent Labs  Lab 09/07/18 1800 09/08/18 0500 09/09/18 0711 09/10/18 0412  AST 26  --  32 23  ALT 15  --  19 19  ALKPHOS 24*  --  44 52  BILITOT 0.6  --  0.7 0.6  PROT 3.3*  --  3.5* 3.8*  ALBUMIN 1.4* 1.5* 1.3* 1.3*   No results for input(s): LIPASE, AMYLASE in the last 168 hours. Recent Labs  Lab 09/07/18 1630  AMMONIA 25    Coagulation Profile: Recent Labs  Lab 09/07/18 1633  INR 1.28    Cardiac Enzymes: No results for input(s): CKTOTAL, CKMB, CKMBINDEX, TROPONINI in the last 168 hours.  BNP (last 3 results) No results for input(s): PROBNP in the last 8760 hours.  HbA1C: No results for input(s): HGBA1C in the last 72 hours.  CBG: Recent Labs  Lab 09/10/18 1630 09/10/18 1718 09/10/18 2243 09/11/18 0803 09/11/18 1246  GLUCAP 138* 154* 243* 243* 147*    Lipid Profile: No results for input(s): CHOL, HDL, LDLCALC, TRIG, CHOLHDL, LDLDIRECT in the last 72 hours.  Thyroid Function Tests: No results for input(s): TSH, T4TOTAL, FREET4, T3FREE, THYROIDAB in the last 72 hours.  Anemia Panel: No results for input(s): VITAMINB12, FOLATE, FERRITIN, TIBC, IRON, RETICCTPCT in the last  72 hours.  Urine analysis:    Component Value Date/Time   COLORURINE STRAW (A) 08/22/2018 2054   APPEARANCEUR CLEAR 08/22/2018 2054   LABSPEC 1.027 08/22/2018 2054   PHURINE 6.0 08/22/2018 2054   GLUCOSEU >=500 (A) 08/22/2018 2054   HGBUR NEGATIVE 08/22/2018 2054   [place] NEGATIVE 08/22/2018 2054   KETONESUR 80 (A) 08/22/2018 2054   PROTEINUR NEGATIVE 08/22/2018 2054   UROBILINOGEN 4.0 (H) 12/05/2016 0921   NITRITE NEGATIVE 08/22/2018 2054   LEUKOCYTESUR NEGATIVE 08/22/2018 2054    Sepsis Labs: Lactic Acid, Venous    Component Value Date/Time   LATICACIDVEN 1.7 09/07/2018 1600    MICROBIOLOGY: Recent Results (from the past 240 hour(s))  Surgical pcr screen     Status: None   Collection Time: 09/10/18  3:39 AM  Result Value Ref Range Status   MRSA, PCR NEGATIVE NEGATIVE Final   Staphylococcus aureus NEGATIVE NEGATIVE Final    Comment: (NOTE) The Xpert SA Assay (FDA approved for NASAL specimens in patients 19 years of age and older), is one component of a comprehensive surveillance program. It is not intended to diagnose infection nor to guide or monitor treatment. Performed at [facility], [place] [address], [place], [place] [address]     RADIOLOGY STUDIES/RESULTS: Dg Abd 1 View  Result Date: 09/07/2018 CLINICAL DATA:  OG tube placement. EXAM: ABDOMEN - 1 VIEW COMPARISON:  CT abdomen pelvis 08/22/2018. FINDINGS: OG tube is in place with the side port and tip in the body of the stomach. The stomach is distended. IMPRESSION: OG tube in good position. Electronically Signed   By: Inge Rise M.D.   On: 09/07/2018 13:31   Ct Angio Chest Pe W Or Wo Contrast  Result Date: 09/06/2018 CLINICAL DATA:  Evaluate thoracic aortic aneurysm. EXAM: CT ANGIOGRAPHY CHEST WITH CONTRAST TECHNIQUE: Multidetector CT imaging of the chest was performed using the standard protocol during bolus administration of intravenous contrast. Multiplanar CT image reconstructions and MIPs  were obtained to evaluate the vascular anatomy. CONTRAST:  65 mL ISOVUE-370 IOPAMIDOL (ISOVUE-370) INJECTION 76% COMPARISON:  01/30/2018; 10/14/2016; 04/25/2026 FINDINGS: Vascular Findings: No evidence of thoracic aortic aneurysm with measurements as follows. No definite evidence of thoracic aortic dissection or periaortic stranding on this nongated examination. Bovine configuration of the aortic arch. The branch vessels of the aortic arch appear widely patent throughout their imaged courses. The descending thoracic aorta is of normal caliber and widely patent without hemodynamically significant stenosis. There is a very minimal amount of atherosclerotic plaque involving the proximal aspect of the descending thoracic aorta. Cardiomegaly.  No pericardial effusion. Although this examination was not tailored for the evaluation the pulmonary arteries, there are no discrete filling defects within the central pulmonary arterial tree to suggest central pulmonary embolism. Borderline enlarged caliber the main pulmonary artery measuring 32 mm in diameter (image 67, series 6) ------------------------------------------------------------- Thoracic aortic measurements: Sinotubular junction 33 mm as measured in greatest oblique coronal dimension. Proximal ascending aorta 35 mm as measured in greatest oblique axial dimension at the level of the main pulmonary artery (image 70, series 6); and approximately 36 mm in greatest oblique short axis coronal diameter (coronal image 48, series 9). Aortic arch aorta 28 mm as measured in greatest oblique sagittal dimension. Proximal descending thoracic aorta 30 mm as measured in greatest oblique axial dimension at the level of the main pulmonary artery. Distal descending thoracic aorta 29 mm as  measured in greatest oblique axial dimension at the level of the diaphragmatic hiatus. Review of the MIP images confirms the above findings.  ------------------------------------------------------------- Non-Vascular Findings: Mediastinum/Lymph Nodes: Prominent right hilar lymph nodes are not enlarged by size criteria with index right suprahilar lymph node measuring 0.8 cm in greatest short axis diameter (image 71, series 6) and index right infrahilar lymph node measuring 0.7 cm (image 88). No definitive bulky mediastinal, hilar or axillary lymphadenopathy. Lungs/Pleura: Interval development of small bilateral pleural effusions with worsening bibasilar consolidative opacities and associated air bronchograms, right greater than left. Interval development of an approximately 1.0 x 0.5 cm subpleural consolidative opacity within the right upper lobe (image 36, series 7). Unchanged punctate (approximately 3 mm) calcified granuloma within the left lower lobe (image 75, series 3). Evaluation for additional pulmonary nodules is degraded secondary to bibasilar airspace opacities. The central pulmonary airways appear widely patent. Upper abdomen: Fluid distention of the mid and distal aspects of the esophagus with marked distension of the imaged superior aspect of the stomach. Musculoskeletal: No acute or aggressive osseous abnormalities. There is incomplete fusion involving the spinous processes of multiple thoracic vertebral bodies, unchanged. Regional soft tissues appear normal. Normal appearance of the thyroid gland. IMPRESSION: 1. No evidence of thoracic aortic aneurysm or dissection. 2. Small bilateral effusions with associated bibasilar consolidative opacities with associated air bronchograms, atelectasis versus infiltrate. Follow-up chest radiograph in 4-6 weeks after treatment is recommended to ensure resolution. 3. Fluid distension of the mid and distal aspects of the esophagus as well as marked distension of the imaged superior aspect stomach - correlation for gastric outlet obstructive symptoms is advised. 4. Cardiomegaly with enlargement of the caliber  the main pulmonary artery as could be seen in the setting of pulmonary arterial hypertension. Electronically Signed   By: Sandi Mariscal M.D.   On: 09/06/2018 09:20   Ir Angiogram Visceral Selective  Result Date: 09/07/2018 INDICATION: 57 year old male with large volume upper GI bleed secondary to a very large duodenal ulcer. Patient underwent endoscopic intervention but continues to require pressors and large volume transfusion. He presents for mesenteric arteriography and embolization of any active bleeding and prophylactic embolization of the gastroduodenal artery. EXAM: IR ULTRASOUND GUIDANCE VASC ACCESS RIGHT; ADDITIONAL ARTERIOGRAPHY; IR EMBO ART VEN HEMORR LYMPH EXTRAV INC GUIDE ROADMAPPING; SELECTIVE VISCERAL ARTERIOGRAPHY 1. Ultrasound-guided vascular access 2. Catheterization of the left gastric artery where it arises directly from the aorta with arteriogram 3. Catheterization of the celiac artery with arteriogram 4. Catheterization of the common hepatic artery with arteriogram 5. Catheterization of the gastroduodenal artery with arteriogram 6. Coil embolization of the gastroduodenal artery 7. Catheterization of the superior mesenteric artery with arteriogram MEDICATIONS: None ANESTHESIA/SEDATION: Patient intubated and on propofol drip CONTRAST:  70 mL Isovue 370 FLUOROSCOPY TIME:  Fluoroscopy Time: 11 minutes 30 seconds (487 mGy). COMPLICATIONS: None immediate. PROCEDURE: Informed consent was obtained from the patient following explanation of the procedure, risks, benefits and alternatives. The patient understands, agrees and consents for the procedure. All questions were addressed. A time out was performed prior to the initiation of the procedure. Maximal barrier sterile technique utilized including caps, mask, sterile gowns, sterile gloves, large sterile drape, hand hygiene, and Betadine prep. The right common femoral artery was interrogated with ultrasound and found to be widely patent. An image was  obtained and stored for the medical record. Local anesthesia was attained by infiltration with 1% lidocaine. A small dermatotomy was made. Under real-time sonographic guidance, the vessel was punctured with a 21 gauge  micropuncture needle. Using standard technique, the initial micro needle was exchanged over a 0.018 micro wire for a transitional 4 [place] micro sheath. The micro sheath was then exchanged over a 0.035 wire for a 5 French vascular sheath. A C2 cobra catheter was advanced in the abdominal aorta over a Bentson wire. The catheter was used to select the first vessel arising from the ventral aorta. Contrast was injected. This is a left gastric artery arising directly from the aorta. Anatomy is normal. No evidence of active bleeding. The Cobra catheter was then used to select the origin of the celiac artery. Arteriography was performed. The splenic artery is small in caliber. The hepatic artery demonstrates normal hepatic arterial anatomy. The gastroduodenal artery is slightly irregular with spasm in the proximal segment. No evidence of active extravasation. A glidewire was successfully advanced into the common hepatic artery and the C2 cobra catheter was advanced into the common hepatic artery. Additional arteriography was performed in multiple obliquities. Again, the proximal gastroduodenal artery is irregular and spasmodic. No evidence of dissection, aneurysm or active bleeding. A Lantern microcatheter was then advanced over a Fathom 16 wire and used to select the distal aspect of the gastroduodenal artery. Arteriography was performed. The gastroepiploic arteries are widely patent. No evidence of active bleeding. Coil embolization was then performed using a 4 mm Ruby POD detachable micro coil followed by a 45 cm Ruby packing coil. Post embolization arteriography demonstrates complete occlusion of the vessel with stasis in the proximal segment. The Lantern microcatheter was brought back into the common  hepatic artery and arteriography was performed. Again, complete occlusion of the gastroduodenal artery. The right gastric artery is visualized. No evidence of active bleeding from the right gastric artery. The microcatheter was removed. The C2 cobra catheter was used to select the superior mesenteric artery. Arteriography was performed. Patent pancreaticoduodenal arcade supplying the gastroepiploic artery. No evidence of active hemorrhage. The C2 cobra catheter was removed. A limited right common femoral arteriogram was performed confirming common femoral arterial access. Hemostasis was attained with the assistance of a Cordis ExoSeal extra arterial vascular plug. IMPRESSION: 1. No evidence of active hemorrhage. 2. 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 3. Prophylactic coil embolization of the gastroduodenal artery. Signed, Criselda Peaches, MD, [facility] Radiology Electronically Signed   By: Jacqulynn Cadet M.D.   On: 09/07/2018 22:32   Ir Angiogram Visceral Selective  Result Date: 09/07/2018 INDICATION: 57 year old male with large volume upper GI bleed secondary to a very large duodenal ulcer. Patient underwent endoscopic intervention but continues to require pressors and large volume transfusion. He presents for mesenteric arteriography and embolization of any active bleeding and prophylactic embolization of the gastroduodenal artery. EXAM: IR ULTRASOUND GUIDANCE VASC ACCESS RIGHT; ADDITIONAL ARTERIOGRAPHY; IR EMBO ART VEN HEMORR LYMPH EXTRAV INC GUIDE ROADMAPPING; SELECTIVE VISCERAL ARTERIOGRAPHY 1. Ultrasound-guided vascular access 2. Catheterization of the left gastric artery where it arises directly from the aorta with arteriogram 3. Catheterization of the celiac artery with arteriogram 4. Catheterization of the common hepatic artery with arteriogram 5. Catheterization of the gastroduodenal artery with arteriogram 6.  Coil embolization of the gastroduodenal artery 7. Catheterization of the superior mesenteric artery with arteriogram MEDICATIONS: None ANESTHESIA/SEDATION: Patient intubated and on propofol drip CONTRAST:  70 mL Isovue 370 FLUOROSCOPY TIME:  Fluoroscopy Time: 11 minutes 30 seconds (487 mGy). COMPLICATIONS: None immediate. PROCEDURE: Informed consent was obtained from the patient following explanation of the procedure, risks, benefits and alternatives. The patient understands,  agrees and consents for the procedure. All questions were addressed. A time out was performed prior to the initiation of the procedure. Maximal barrier sterile technique utilized including caps, mask, sterile gowns, sterile gloves, large sterile drape, hand hygiene, and Betadine prep. The right common femoral artery was interrogated with ultrasound and found to be widely patent. An image was obtained and stored for the medical record. Local anesthesia was attained by infiltration with 1% lidocaine. A small dermatotomy was made. Under real-time sonographic guidance, the vessel was punctured with a 21 gauge micropuncture needle. Using standard technique, the initial micro needle was exchanged over a 0.018 micro wire for a transitional 4 [place] micro sheath. The micro sheath was then exchanged over a 0.035 wire for a 5 French vascular sheath. A C2 cobra catheter was advanced in the abdominal aorta over a Bentson wire. The catheter was used to select the first vessel arising from the ventral aorta. Contrast was injected. This is a left gastric artery arising directly from the aorta. Anatomy is normal. No evidence of active bleeding. The Cobra catheter was then used to select the origin of the celiac artery. Arteriography was performed. The splenic artery is small in caliber. The hepatic artery demonstrates normal hepatic arterial anatomy. The gastroduodenal artery is slightly irregular with spasm in the proximal segment. No evidence of active  extravasation. A glidewire was successfully advanced into the common hepatic artery and the C2 cobra catheter was advanced into the common hepatic artery. Additional arteriography was performed in multiple obliquities. Again, the proximal gastroduodenal artery is irregular and spasmodic. No evidence of dissection, aneurysm or active bleeding. A Lantern microcatheter was then advanced over a Fathom 16 wire and used to select the distal aspect of the gastroduodenal artery. Arteriography was performed. The gastroepiploic arteries are widely patent. No evidence of active bleeding. Coil embolization was then performed using a 4 mm Ruby POD detachable micro coil followed by a 45 cm Ruby packing coil. Post embolization arteriography demonstrates complete occlusion of the vessel with stasis in the proximal segment. The Lantern microcatheter was brought back into the common hepatic artery and arteriography was performed. Again, complete occlusion of the gastroduodenal artery. The right gastric artery is visualized. No evidence of active bleeding from the right gastric artery. The microcatheter was removed. The C2 cobra catheter was used to select the superior mesenteric artery. Arteriography was performed. Patent pancreaticoduodenal arcade supplying the gastroepiploic artery. No evidence of active hemorrhage. The C2 cobra catheter was removed. A limited right common femoral arteriogram was performed confirming common femoral arterial access. Hemostasis was attained with the assistance of a Cordis ExoSeal extra arterial vascular plug. IMPRESSION: 1. No evidence of active hemorrhage. 2. 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 3. Prophylactic coil embolization of the gastroduodenal artery. Signed, Criselda Peaches, MD, [place] [facility] Radiology Electronically Signed   By: Jacqulynn Cadet M.D.   On: 09/07/2018 22:32   Ir Angiogram Selective Each  Additional Vessel  Result Date: 09/07/2018 INDICATION: 57 year old male with large volume upper GI bleed secondary to a very large duodenal ulcer. Patient underwent endoscopic intervention but continues to require pressors and large volume transfusion. He presents for mesenteric arteriography and embolization of any active bleeding and prophylactic embolization of the gastroduodenal artery. EXAM: IR ULTRASOUND GUIDANCE VASC ACCESS RIGHT; ADDITIONAL ARTERIOGRAPHY; IR EMBO ART VEN HEMORR LYMPH EXTRAV INC GUIDE ROADMAPPING; SELECTIVE VISCERAL ARTERIOGRAPHY 1. Ultrasound-guided vascular access 2. Catheterization of the left gastric artery where it arises directly  from the aorta with arteriogram 3. Catheterization of the celiac artery with arteriogram 4. Catheterization of the common hepatic artery with arteriogram 5. Catheterization of the gastroduodenal artery with arteriogram 6. Coil embolization of the gastroduodenal artery 7. Catheterization of the superior mesenteric artery with arteriogram MEDICATIONS: None ANESTHESIA/SEDATION: Patient intubated and on propofol drip CONTRAST:  70 mL Isovue 370 FLUOROSCOPY TIME:  Fluoroscopy Time: 11 minutes 30 seconds (487 mGy). COMPLICATIONS: None immediate. PROCEDURE: Informed consent was obtained from the patient following explanation of the procedure, risks, benefits and alternatives. The patient understands, agrees and consents for the procedure. All questions were addressed. A time out was performed prior to the initiation of the procedure. Maximal barrier sterile technique utilized including caps, mask, sterile gowns, sterile gloves, large sterile drape, hand hygiene, and Betadine prep. The right common femoral artery was interrogated with ultrasound and found to be widely patent. An image was obtained and stored for the medical record. Local anesthesia was attained by infiltration with 1% lidocaine. A small dermatotomy was made. Under real-time sonographic guidance, the  vessel was punctured with a 21 gauge micropuncture needle. Using standard technique, the initial micro needle was exchanged over a 0.018 micro wire for a transitional 4 [place] micro sheath. The micro sheath was then exchanged over a 0.035 wire for a 5 French vascular sheath. A C2 cobra catheter was advanced in the abdominal aorta over a Bentson wire. The catheter was used to select the first vessel arising from the ventral aorta. Contrast was injected. This is a left gastric artery arising directly from the aorta. Anatomy is normal. No evidence of active bleeding. The Cobra catheter was then used to select the origin of the celiac artery. Arteriography was performed. The splenic artery is small in caliber. The hepatic artery demonstrates normal hepatic arterial anatomy. The gastroduodenal artery is slightly irregular with spasm in the proximal segment. No evidence of active extravasation. A glidewire was successfully advanced into the common hepatic artery and the C2 cobra catheter was advanced into the common hepatic artery. Additional arteriography was performed in multiple obliquities. Again, the proximal gastroduodenal artery is irregular and spasmodic. No evidence of dissection, aneurysm or active bleeding. A Lantern microcatheter was then advanced over a Fathom 16 wire and used to select the distal aspect of the gastroduodenal artery. Arteriography was performed. The gastroepiploic arteries are widely patent. No evidence of active bleeding. Coil embolization was then performed using a 4 mm Ruby POD detachable micro coil followed by a 45 cm Ruby packing coil. Post embolization arteriography demonstrates complete occlusion of the vessel with stasis in the proximal segment. The Lantern microcatheter was brought back into the common hepatic artery and arteriography was performed. Again, complete occlusion of the gastroduodenal artery. The right gastric artery is visualized. No evidence of active bleeding from the  right gastric artery. The microcatheter was removed. The C2 cobra catheter was used to select the superior mesenteric artery. Arteriography was performed. Patent pancreaticoduodenal arcade supplying the gastroepiploic artery. No evidence of active hemorrhage. The C2 cobra catheter was removed. A limited right common femoral arteriogram was performed confirming common femoral arterial access. Hemostasis was attained with the assistance of a Cordis ExoSeal extra arterial vascular plug. IMPRESSION: 1. No evidence of active hemorrhage. 2. 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 3. Prophylactic coil embolization of the gastroduodenal artery. Signed, Criselda Peaches, MD, [place] [facility] Radiology Electronically Signed   By: Jacqulynn Cadet M.D.   On: 09/07/2018 22:32  Ir Angiogram Selective Each Additional Vessel  Result Date: 09/07/2018 INDICATION: 57 year old male with large volume upper GI bleed secondary to a very large duodenal ulcer. Patient underwent endoscopic intervention but continues to require pressors and large volume transfusion. He presents for mesenteric arteriography and embolization of any active bleeding and prophylactic embolization of the gastroduodenal artery. EXAM: IR ULTRASOUND GUIDANCE VASC ACCESS RIGHT; ADDITIONAL ARTERIOGRAPHY; IR EMBO ART VEN HEMORR LYMPH EXTRAV INC GUIDE ROADMAPPING; SELECTIVE VISCERAL ARTERIOGRAPHY 1. Ultrasound-guided vascular access 2. Catheterization of the left gastric artery where it arises directly from the aorta with arteriogram 3. Catheterization of the celiac artery with arteriogram 4. Catheterization of the common hepatic artery with arteriogram 5. Catheterization of the gastroduodenal artery with arteriogram 6. Coil embolization of the gastroduodenal artery 7. Catheterization of the superior mesenteric artery with arteriogram MEDICATIONS: None ANESTHESIA/SEDATION: Patient  intubated and on propofol drip CONTRAST:  70 mL Isovue 370 FLUOROSCOPY TIME:  Fluoroscopy Time: 11 minutes 30 seconds (487 mGy). COMPLICATIONS: None immediate. PROCEDURE: Informed consent was obtained from the patient following explanation of the procedure, risks, benefits and alternatives. The patient understands, agrees and consents for the procedure. All questions were addressed. A time out was performed prior to the initiation of the procedure. Maximal barrier sterile technique utilized including caps, mask, sterile gowns, sterile gloves, large sterile drape, hand hygiene, and Betadine prep. The right common femoral artery was interrogated with ultrasound and found to be widely patent. An image was obtained and stored for the medical record. Local anesthesia was attained by infiltration with 1% lidocaine. A small dermatotomy was made. Under real-time sonographic guidance, the vessel was punctured with a 21 gauge micropuncture needle. Using standard technique, the initial micro needle was exchanged over a 0.018 micro wire for a transitional 4 [place] micro sheath. The micro sheath was then exchanged over a 0.035 wire for a 5 French vascular sheath. A C2 cobra catheter was advanced in the abdominal aorta over a Bentson wire. The catheter was used to select the first vessel arising from the ventral aorta. Contrast was injected. This is a left gastric artery arising directly from the aorta. Anatomy is normal. No evidence of active bleeding. The Cobra catheter was then used to select the origin of the celiac artery. Arteriography was performed. The splenic artery is small in caliber. The hepatic artery demonstrates normal hepatic arterial anatomy. The gastroduodenal artery is slightly irregular with spasm in the proximal segment. No evidence of active extravasation. A glidewire was successfully advanced into the common hepatic artery and the C2 cobra catheter was advanced into the common hepatic artery. Additional  arteriography was performed in multiple obliquities. Again, the proximal gastroduodenal artery is irregular and spasmodic. No evidence of dissection, aneurysm or active bleeding. A Lantern microcatheter was then advanced over a Fathom 16 wire and used to select the distal aspect of the gastroduodenal artery. Arteriography was performed. The gastroepiploic arteries are widely patent. No evidence of active bleeding. Coil embolization was then performed using a 4 mm Ruby POD detachable micro coil followed by a 45 cm Ruby packing coil. Post embolization arteriography demonstrates complete occlusion of the vessel with stasis in the proximal segment. The Lantern microcatheter was brought back into the common hepatic artery and arteriography was performed. Again, complete occlusion of the gastroduodenal artery. The right gastric artery is visualized. No evidence of active bleeding from the right gastric artery. The microcatheter was removed. The C2 cobra catheter was used to select the superior mesenteric artery. Arteriography was performed. Patent pancreaticoduodenal  arcade supplying the gastroepiploic artery. No evidence of active hemorrhage. The C2 cobra catheter was removed. A limited right common femoral arteriogram was performed confirming common femoral arterial access. Hemostasis was attained with the assistance of a Cordis ExoSeal extra arterial vascular plug. IMPRESSION: 1. No evidence of active hemorrhage. 2. 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 3. Prophylactic coil embolization of the gastroduodenal artery. Signed, Criselda Peaches, MD, [place] [facility] Radiology Electronically Signed   By: Jacqulynn Cadet M.D.   On: 09/07/2018 22:32   Ir US Guide Vasc Access Right  Result Date: 09/07/2018 INDICATION: 57 year old male with large volume upper GI bleed secondary to a very large duodenal ulcer. Patient underwent endoscopic  intervention but continues to require pressors and large volume transfusion. He presents for mesenteric arteriography and embolization of any active bleeding and prophylactic embolization of the gastroduodenal artery. EXAM: IR ULTRASOUND GUIDANCE VASC ACCESS RIGHT; ADDITIONAL ARTERIOGRAPHY; IR EMBO ART VEN HEMORR LYMPH EXTRAV INC GUIDE ROADMAPPING; SELECTIVE VISCERAL ARTERIOGRAPHY 1. Ultrasound-guided vascular access 2. Catheterization of the left gastric artery where it arises directly from the aorta with arteriogram 3. Catheterization of the celiac artery with arteriogram 4. Catheterization of the common hepatic artery with arteriogram 5. Catheterization of the gastroduodenal artery with arteriogram 6. Coil embolization of the gastroduodenal artery 7. Catheterization of the superior mesenteric artery with arteriogram MEDICATIONS: None ANESTHESIA/SEDATION: Patient intubated and on propofol drip CONTRAST:  70 mL Isovue 370 FLUOROSCOPY TIME:  Fluoroscopy Time: 11 minutes 30 seconds (487 mGy). COMPLICATIONS: None immediate. PROCEDURE: Informed consent was obtained from the patient following explanation of the procedure, risks, benefits and alternatives. The patient understands, agrees and consents for the procedure. All questions were addressed. A time out was performed prior to the initiation of the procedure. Maximal barrier sterile technique utilized including caps, mask, sterile gowns, sterile gloves, large sterile drape, hand hygiene, and Betadine prep. The right common femoral artery was interrogated with ultrasound and found to be widely patent. An image was obtained and stored for the medical record. Local anesthesia was attained by infiltration with 1% lidocaine. A small dermatotomy was made. Under real-time sonographic guidance, the vessel was punctured with a 21 gauge micropuncture needle. Using standard technique, the initial micro needle was exchanged over a 0.018 micro wire for a transitional 4 [place]  micro sheath. The micro sheath was then exchanged over a 0.035 wire for a 5 French vascular sheath. A C2 cobra catheter was advanced in the abdominal aorta over a Bentson wire. The catheter was used to select the first vessel arising from the ventral aorta. Contrast was injected. This is a left gastric artery arising directly from the aorta. Anatomy is normal. No evidence of active bleeding. The Cobra catheter was then used to select the origin of the celiac artery. Arteriography was performed. The splenic artery is small in caliber. The hepatic artery demonstrates normal hepatic arterial anatomy. The gastroduodenal artery is slightly irregular with spasm in the proximal segment. No evidence of active extravasation. A glidewire was successfully advanced into the common hepatic artery and the C2 cobra catheter was advanced into the common hepatic artery. Additional arteriography was performed in multiple obliquities. Again, the proximal gastroduodenal artery is irregular and spasmodic. No evidence of dissection, aneurysm or active bleeding. A Lantern microcatheter was then advanced over a Fathom 16 wire and used to select the distal aspect of the gastroduodenal artery. Arteriography was performed. The gastroepiploic arteries are widely patent. No evidence of active bleeding. Coil  embolization was then performed using a 4 mm Ruby POD detachable micro coil followed by a 45 cm Ruby packing coil. Post embolization arteriography demonstrates complete occlusion of the vessel with stasis in the proximal segment. The Lantern microcatheter was brought back into the common hepatic artery and arteriography was performed. Again, complete occlusion of the gastroduodenal artery. The right gastric artery is visualized. No evidence of active bleeding from the right gastric artery. The microcatheter was removed. The C2 cobra catheter was used to select the superior mesenteric artery. Arteriography was performed. Patent  pancreaticoduodenal arcade supplying the gastroepiploic artery. No evidence of active hemorrhage. The C2 cobra catheter was removed. A limited right common femoral arteriogram was performed confirming common femoral arterial access. Hemostasis was attained with the assistance of a Cordis ExoSeal extra arterial vascular plug. IMPRESSION: 1. No evidence of active hemorrhage. 2. 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 3. Prophylactic coil embolization of the gastroduodenal artery. Signed, Criselda Peaches, MD, [place] [facility] Radiology Electronically Signed   By: Jacqulynn Cadet M.D.   On: 09/07/2018 22:32   Dg Chest Port 1 View  Result Date: 09/09/2018 CLINICAL DATA:  Respiratory failure EXAM: PORTABLE CHEST 1 VIEW COMPARISON:  09/07/2018 FINDINGS: Endotracheal tube and nasogastric catheter have been removed in the interval. Left jugular central line is again noted in the distal superior vena cava. The cardiac shadow is stable. The lungs are well aerated bilaterally with patchy bibasilar infiltrates and small left pleural effusion. IMPRESSION: Stable bibasilar infiltrates and small left pleural effusion. Electronically Signed   By: Inez Catalina M.D.   On: 09/09/2018 07:17   Dg Chest Port 1 View  Result Date: 09/07/2018 CLINICAL DATA:  Status post fasciotomy and hematoma back UA shins in the right lower leg 2 days ago. Intubated patient. EXAM: PORTABLE CHEST 1 VIEW COMPARISON:  CT scan of the chest of September 06, 2018 FINDINGS: The lungs are well-expanded. There are patchy increased densities at both bases which are slightly more conspicuous today. There is a small right and and somewhat larger left pleural effusion. The heart and pulmonary vascularity are normal. The endotracheal tube tip projects 4.1 cm above the carina. The esophagogastric tube tip in proximal port project below the GE junction. The left internal jugular  venous catheter tip projects over the midportion of the SVC. IMPRESSION: Bibasilar atelectasis or pneumonia. Small right pleural effusion and somewhat larger left pleural effusion. The support tubes are in reasonable position. Electronically Signed   By: David  Martinique M.D.   On: 09/07/2018 13:27   Dg Abd Acute W/chest  Result Date: 08/15/2018 CLINICAL DATA:  Left-sided abdominal pain since July 22, 2018. Weakness and nausea. Vomiting beginning yesterday. Centralized chest pain. EXAM: DG ABDOMEN ACUTE W/ 1V CHEST COMPARISON:  None. FINDINGS: The heart, hila, and mediastinum are normal. No pneumothorax, pulmonary nodule, or infiltrate. No free air, portal venous gas, or pneumatosis. Moderate fecal loading in the colon. No bowel obstruction identified. A rounded calcification to the left of the L4 spinous process is noted. IMPRESSION: 1. Moderate fecal loading throughout the colon. 2. Rounded 2 mm calcification to the left of the L4 spinous process could represent bowel contents. A ureteral stone could not be excluded on this study, however. Recommend clinical correlation. If there is concern for a ureteral stone, CT imaging could better evaluate. Electronically Signed   By: Dorise Bullion III M.D   On: 08/15/2018 19:02   Ct Renal Stone Study  Result Date:  08/22/2018 CLINICAL DATA:  Left flank pain. Nausea vomiting for 4 days. Previous appendectomy. EXAM: CT ABDOMEN AND PELVIS WITHOUT CONTRAST TECHNIQUE: Multidetector CT imaging of the abdomen and pelvis was performed following the standard protocol without IV contrast. COMPARISON:  CT scan December 19, 2017 FINDINGS: Lower chest: No acute abnormality. Hepatobiliary: No focal liver abnormality is seen. No gallstones, gallbladder wall thickening, or biliary dilatation. Pancreas: Unremarkable. No pancreatic ductal dilatation or surrounding inflammatory changes. Spleen: Normal in size without focal abnormality. Adrenals/Urinary Tract: Adrenal glands are normal.  A 3 mm stone is again noted in the mid right kidney. No hydronephrosis or perinephric stranding. Bilateral extrarenal pelvises. No ureterectasis or ureteral stones noted. The bladder is unremarkable. Stomach/Bowel: The stomach is distended. Small bowel is normal. Moderate to severe fecal loading throughout the colon with a stool ball in the rectum. By report, the patient is status post appendectomy. 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 Atherosclerotic changes seen in the aorta, iliac vessels, and femoral vessels. Reproductive: Prostate is unremarkable. Other: No abdominal wall hernia or abnormality. No abdominopelvic ascites. Musculoskeletal: No acute or significant osseous findings. IMPRESSION: 1. Nonobstructive 3 mm stone in the right kidney. No renal obstruction or other stones noted. 2. Gastric distention of uncertain etiology. 3. Moderate to severe fecal loading throughout the colon. 4. Atherosclerotic changes throughout the abdominal aorta and iliac vessels. 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 Recommend followup by ultrasound in 3 years. This recommendation follows ACR consensus guidelines: White Paper of the ACR Incidental Findings Committee II on Vascular Findings. Joellyn Rued Radiol 2013; [id number] Electronically Signed   By: Dorise Bullion III M.D   On: 08/22/2018 19:54   Ir Embo Art  [place] Guide Roadmapping  Result Date: 09/07/2018 INDICATION: 57 year old male with large volume upper GI bleed secondary to a very large duodenal ulcer. Patient underwent endoscopic intervention but continues to require pressors and large volume transfusion. He presents  for mesenteric arteriography and embolization of any active bleeding and prophylactic embolization of the gastroduodenal artery. EXAM: IR ULTRASOUND GUIDANCE VASC ACCESS RIGHT; ADDITIONAL ARTERIOGRAPHY; IR EMBO ART VEN HEMORR LYMPH EXTRAV INC GUIDE ROADMAPPING; SELECTIVE VISCERAL ARTERIOGRAPHY 1. Ultrasound-guided vascular access 2. Catheterization of the left gastric artery where it arises directly from the aorta with arteriogram 3. Catheterization of the celiac artery with arteriogram 4. Catheterization of the common hepatic artery with arteriogram 5. Catheterization of the gastroduodenal artery with arteriogram 6. Coil embolization of the gastroduodenal artery 7. Catheterization of the superior mesenteric artery with arteriogram MEDICATIONS: None ANESTHESIA/SEDATION: Patient intubated and on propofol drip CONTRAST:  70 mL Isovue 370 FLUOROSCOPY TIME:  Fluoroscopy Time: 11 minutes 30 seconds (487 mGy). COMPLICATIONS: None immediate. PROCEDURE: Informed consent was obtained from the patient following explanation of the procedure, risks, benefits and alternatives. The patient understands, agrees and consents for the procedure. All questions were addressed. A time out was performed prior to the initiation of the procedure. Maximal barrier sterile technique utilized including caps, mask, sterile gowns, sterile gloves, large sterile drape, hand hygiene, and Betadine prep. The right common femoral artery was interrogated with ultrasound and found to be widely patent. An image was obtained and stored  for the medical record. Local anesthesia was attained by infiltration with 1% lidocaine. A small dermatotomy was made. Under real-time sonographic guidance, the vessel was punctured with a 21 gauge micropuncture needle. Using standard technique, the initial micro needle was exchanged over a 0.018 micro wire for a transitional 4 [place] micro sheath. The micro sheath was then exchanged over a 0.035 wire for a 5 French vascular  sheath. A C2 cobra catheter was advanced in the abdominal aorta over a Bentson wire. The catheter was used to select the first vessel arising from the ventral aorta. Contrast was injected. This is a left gastric artery arising directly from the aorta. Anatomy is normal. No evidence of active bleeding. The Cobra catheter was then used to select the origin of the celiac artery. Arteriography was performed. The splenic artery is small in caliber. The hepatic artery demonstrates normal hepatic arterial anatomy. The gastroduodenal artery is slightly irregular with spasm in the proximal segment. No evidence of active extravasation. A glidewire was successfully advanced into the common hepatic artery and the C2 cobra catheter was advanced into the common hepatic artery. Additional arteriography was performed in multiple obliquities. Again, the proximal gastroduodenal artery is irregular and spasmodic. No evidence of dissection, aneurysm or active bleeding. A Lantern microcatheter was then advanced over a Fathom 16 wire and used to select the distal aspect of the gastroduodenal artery. Arteriography was performed. The gastroepiploic arteries are widely patent. No evidence of active bleeding. Coil embolization was then performed using a 4 mm Ruby POD detachable micro coil followed by a 45 cm Ruby packing coil. Post embolization arteriography demonstrates complete occlusion of the vessel with stasis in the proximal segment. The Lantern microcatheter was brought back into the common hepatic artery and arteriography was performed. Again, complete occlusion of the gastroduodenal artery. The right gastric artery is visualized. No evidence of active bleeding from the right gastric artery. The microcatheter was removed. The C2 cobra catheter was used to select the superior mesenteric artery. Arteriography was performed. Patent pancreaticoduodenal arcade supplying the gastroepiploic artery. No evidence of active hemorrhage. The C2  cobra catheter was removed. A limited right common femoral arteriogram was performed confirming common femoral arterial access. Hemostasis was attained with the assistance of a Cordis ExoSeal extra arterial vascular plug. IMPRESSION: 1. No evidence of active hemorrhage. 2. 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 3. Prophylactic coil embolization of the gastroduodenal artery. Signed, Criselda Peaches, MD, [facility] Radiology Electronically Signed   By: Jacqulynn Cadet M.D.   On: 09/07/2018 22:32   Ct Angio Abd/pel W/ And/or W/o  Result Date: 09/11/2018 CLINICAL DATA:  Status postoperative right popliteal and tibial embolectomy for embolic disease. Known bilateral iliac artery aneurysmal disease. EXAM: CT ANGIOGRAPHY ABDOMEN AND PELVIS WITH CONTRAST TECHNIQUE: Multidetector CT imaging of the abdomen and pelvis was performed using the standard protocol during bolus administration of intravenous contrast. Multiplanar reconstructed images and MIPs were obtained and reviewed to evaluate the vascular anatomy. CONTRAST:  130m ISOVUE-370 IOPAMIDOL (ISOVUE-370) INJECTION 76% COMPARISON:  CTA of the abdomen on 01/30/2018 and CT of the abdomen and pelvis with contrast on 12/19/2017. FINDINGS: VASCULAR Aorta: Stable mild dilatation of the infrarenal aorta with maximal diameter of 3.1 cm. Celiac: Mild origin stenosis of 25-30%. Distal branches are patent. Status post transcatheter coil embolization of the gastroduodenal artery on 09/07/2018. Hepatic arteries are normally patent. SMA: Normally patent. Renals: Bilateral single renal arteries demonstrate normal patency. IMA: Normally patent. Inflow: Fusiform  aneurysmal disease present of bilateral common iliac arteries as previously demonstrated. Maximal caliber of the right common iliac artery is 2.8-2.9 cm and maximal caliber of the right common iliac artery is 2.7 cm. These measurements  are stable since prior studies. The significant change since the prior studies is a much more prominent and new component of mural thrombus in both common iliac arteries, right greater than left. In the proximal right common iliac artery, mural thrombus narrows the aneurysmal lumen by at least 50% in its narrowest portion. Eccentric mural thrombus is present at the origin of the right common iliac artery and also distally just prior to the common iliac bifurcation without significant luminal stenosis. Aneurysmal disease again noted of bilateral internal iliac arteries with the left measuring approximately 2.5 cm in greatest diameter and the right measuring 1.5 cm. Both internal iliac arteries also demonstrate significant mural thrombus without occlusion. Mural thrombus is stable to slightly more prominent compared to the 12/19/2017 study. Bilateral external iliac arteries are normally patent and demonstrate diffuse arteriomegaly with maximum caliber 1.5 cm. Proximal Outflow: Bilateral common femoral arteries demonstrate dilatation with the left measuring up to 2.1 cm in diameter. Mural thrombus is present along the posterior lumen with approximately 50% reduction in luminal diameter. No thrombus is seen extending into proximal SFA or profunda femoral arteries. On the right side the common femoral artery measures up to 1.8 cm in the femoral bifurcation appears widely patent. Veins: Delayed venous phase imaging shows patent venous structures without evidence of thrombus. There is some probable mass effect of ectatic external iliac arteries on the distal aspects of bilateral external iliac veins without evidence of associated deep venous thrombosis or significant collateral vein formation. Review of the MIP images confirms the above findings. NON-VASCULAR Lower chest: Small bilateral pleural effusions with bibasilar atelectasis. Hepatobiliary: No focal liver abnormality is seen. No gallstones, gallbladder wall  thickening, or biliary dilatation. Pancreas: Since the March study, there is a change in appearance of the pancreas on the venous phase of imaging. The tail of the pancreas now appears progressively atrophic with associated pancreatic ductal dilatation up to approximately 7 mm. At the junction of the body and head of the pancreas, there is suggestion a potential ill-defined low-attenuation mass that could measure as much as 3 cm in AP diameter. Borders are very difficult to delineate by CT. There is no associated biliary obstruction. 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 Recommend eventual correlation with MRI of the abdomen with and without gadolinium. There may be a window of time recommended to wait to perform MRI after placement of the gastroduodenal artery Ruby coils. This can be further investigated based on manufacturer recommendation. If there may be a significant delay in ability to perform MRI, consider endoscopic ultrasound to investigate the pancreas. Spleen: Normal in size without focal abnormality. Adrenals/Urinary Tract: Adrenal glands are unremarkable. Kidneys are normal, without renal calculi, focal lesion, or hydronephrosis. Bladder is unremarkable. Stomach/Bowel: Gastric fold thickening and prominent fold thickening of the duodenum may be consistent with known peptic ulcer disease. No evidence of bowel obstruction or free intraperitoneal air. No focal abscess identified. Lymphatic: No enlarged lymph nodes identified. Reproductive: Prostate is unremarkable. Other: Diffuse anasarca of the body wall and edema of the mesenteric and retroperitoneal fat likely relate to low albumin and malnutrition. Musculoskeletal: Moderate degenerative disc disease at L5-S1. IMPRESSION: VASCULAR 1. 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 No complications evident after embolization. 2. Stable mild aneurysmal disease of the distal abdominal aorta  measuring 3.1 cm. 3. Significant  aneurysmal disease of bilateral common iliac and internal iliac arteries, as above. The major change since prior imaging is significant increase in mural thrombus within the dilated common iliac arteries bilaterally, especially on the right. This is most likely the source distal emboli to the right distal leg. Mural thrombus in the internal iliac artery aneurysms is stable to slightly more prominent. 4. Stable diffuse enlargement bilateral external iliac and common femoral arteries. The left common femoral artery does contain significant posterior wall mural thrombus. NON-VASCULAR 1. The most significant nonvascular finding is potential development of a mass of the pancreas at the juncture of the head and body causing progressive atrophy of the tail of the pancreas with associated pancreatic ductal dilatation. On the venous phase of imaging, delineation of the potential mass is quite vague but the region of mass may measure as much as 3 cm. There may be some issue with immediate performance of MRI in the setting recent coiling of the gastroduodenal artery even if the coils are MRI compatible. This can be checked with the manufacturer recommendation. If MRI can be performed immediately, an MRI of the abdomen with and without gadolinium would be helpful for further evaluation. If there is going to be significant delay, consider EUS characterization of the pancreas. 2. Diffuse body wall anasarca and edema of the mesenteric fat and retroperitoneum likely reflects low albumin and malnutrition. 3. Prominent gastric folds and duodenal folds without evidence of bowel obstruction or perforation. Electronically Signed   By: Aletta Edouard M.D.   On: 09/11/2018 12:36     LOS: 8 days   Signature  Lala Lund M.D on 09/11/2018 at 12:51 PM   -  To page go to [email or web address] - password [email or web address]

## 2018-09-11 NOTE — Evaluation (Signed)
Physical Therapy Evaluation Patient Details Name: Ryan Medina MRN: [id number] DOB: 08-Oct-1960 Today's Date: 09/11/2018   History of Present Illness  Patient is a 57 y.o. male with PMH including but not limited to DM and HTN who presented to the hospital on 12/13 with a right ischemic leg secondary to acute popliteal and tibial embolus, underwent embolectomy on 12/13 and subsequently was placed on a heparin drip.  He unfortunately developed a acute right lower leg hematoma with compartment syndrome requiring fasciotomy on 12/15.  Further hospital course was complicated by development of hemorrhagic shock with acute blood loss anemia secondary to a bleeding duodenal ulcer-requiring ICU transfer-intubation for airway protection and IR evaluation with mesenteric angiogram and GDA embolization.  Upon stability he was transferred back to the triad hospitalist service on 12/19. Pt is now s/p closure of R LE fasciotomy on 09/10/18.    Clinical Impression  Pt presented supine in bed with HOB elevated, awake and willing to participate in therapy session. Prior to admission, pt reported that he was independent with ADLs and used a cane to ambulate. Pt lives in a single level home with several steps to enter. Pt reporting that his fiance can provide 24/7 supervision/assistance upon d/c. Pt currently requires min guard for bed mobility and mod A x2 for transfers. Pt very limited this session secondary to pain and weakness. Pt would greatly benefit from further intensive therapy services at CIR prior to returning home with family support. Pt would continue to benefit from skilled physical therapy services at this time while admitted and after d/c to address the below listed limitations in order to improve overall safety and independence with functional mobility.     Follow Up Recommendations CIR    Equipment Recommendations  Rolling walker with 5" wheels;3in1 (PT)    Recommendations for Other Services Rehab  consult     Precautions / Restrictions Precautions Precautions: Fall Restrictions Weight Bearing Restrictions: Yes RLE Weight Bearing: Weight bearing as tolerated      Mobility  Bed Mobility Overal bed mobility: Needs Assistance Bed Mobility: Supine to Sit;Sit to Supine     Supine to sit: Min guard Sit to supine: Min guard   General bed mobility comments: increased time and effort, min guard for safety  Transfers Overall transfer level: Needs assistance Equipment used: Rolling walker (2 wheeled) Transfers: Sit to/from [facility] Sit to Stand: From elevated surface;Mod assist;+2 physical assistance Stand pivot transfers: Mod assist;+2 physical assistance       General transfer comment: increased time and effort, cueing for technique and safe hand placement, assist to power into standing and for pivot, assist needed for RW management as well; pt performed sit<>stand x1 from EOB and x1 from recliner chair, performed pivot x2  Ambulation/Gait                Stairs            Wheelchair Mobility    Modified Rankin (Stroke Patients Only)       Balance Overall balance assessment: Needs assistance Sitting-balance support: Feet supported Sitting balance-Leahy Scale: Fair     Standing balance support: Bilateral upper extremity supported Standing balance-Leahy Scale: Poor Standing balance comment: reliant on bilateral UEs on RW                             Pertinent Vitals/Pain Pain Assessment: Faces Faces Pain Scale: Hurts even more Pain Location: R LE Pain Descriptors /  Indicators: Sore;Guarding Pain Intervention(s): Monitored during session;Repositioned    Home Living Family/patient expects to be discharged to:: Private residence Living Arrangements: Spouse/significant other Available Help at Discharge: Family Type of Home: House Home Access: Stairs to enter   Technical brewer of Steps: 4 and then 4 Home  Layout: One level Home Equipment: Cane - single point;Crutches      Prior Function Level of Independence: Independent with assistive device(s)         Comments: ADLs, IADLs, use of SPC for functional mobility     Hand Dominance        Extremity/Trunk Assessment   Upper Extremity Assessment Upper Extremity Assessment: Overall WFL for tasks assessed    Lower Extremity Assessment Lower Extremity Assessment: RLE deficits/detail RLE Deficits / Details: pt with decreased strength and ROM limitations secnodary to post-op pain and weakness. Sensation decreased to light touch throughout RLE: Unable to fully assess due to pain RLE Coordination: decreased fine motor;decreased gross motor       Communication   Communication: No difficulties  Cognition Arousal/Alertness: Awake/alert Behavior During Therapy: WFL for tasks assessed/performed Overall Cognitive Status: Within Functional Limits for tasks assessed                                        General Comments      Exercises     Assessment/Plan    PT Assessment Patient needs continued PT services  PT Problem List Decreased strength;Decreased range of motion;Decreased activity tolerance;Decreased balance;Decreased mobility;Decreased coordination;Decreased knowledge of use of DME;Decreased safety awareness;Decreased knowledge of precautions;Pain       PT Treatment Interventions DME instruction;Gait training;Stair training;Functional mobility training;Therapeutic activities;Therapeutic exercise;Balance training;Neuromuscular re-education;Patient/family education    PT Goals (Current goals can be found in the Care Plan section)  Acute Rehab PT Goals Patient Stated Goal: decrease pain PT Goal Formulation: With patient Time For Goal Achievement: 09/25/18 Potential to Achieve Goals: Good    Frequency Min 3X/week   Barriers to discharge        Co-evaluation PT/OT/SLP Co-Evaluation/Treatment:  Yes Reason for Co-Treatment: For patient/therapist safety;To address functional/ADL transfers PT goals addressed during session: Mobility/safety with mobility;Balance;Proper use of DME;Strengthening/ROM         AM-PAC PT "6 Clicks" Mobility  Outcome Measure Help needed turning from your back to your side while in a flat bed without using bedrails?: None Help needed moving from lying on your back to sitting on the side of a flat bed without using bedrails?: None Help needed moving to and from a bed to a chair (including a wheelchair)?: A Lot Help needed standing up from a chair using your arms (e.g., wheelchair or bedside chair)?: A Lot Help needed to walk in hospital room?: A Lot Help needed climbing 3-5 steps with a railing? : Total 6 Click Score: 15    End of Session Equipment Utilized During Treatment: Gait belt Activity Tolerance: Patient limited by pain Patient left: in bed;with call bell/phone within reach Nurse Communication: Mobility status PT Visit Diagnosis: Other abnormalities of gait and mobility (R26.89);Pain Pain - Right/Left: Right Pain - part of body: Leg    Time: 1010-1035 PT Time Calculation (min) (ACUTE ONLY): 25 min   Charges:   PT Evaluation $PT Eval Moderate Complexity: 1 Mod          Sherie Don, PT, DPT  Acute Rehabilitation Services Pager [phone] Office [phone]    Anderson [place]  M Piccola [place] 09/11/2018, 2:09 PM

## 2018-09-11 NOTE — Progress Notes (Signed)
Informed Dr. Candiss Norse that patients has not been able to void after foley was removed and has 795 ml's in bladder per bladder scan. Per MD replace foley and order Flomax 0.4mg  daily including now.

## 2018-09-12 ENCOUNTER — Inpatient Hospital Stay (HOSPITAL_COMMUNITY): Payer: Medicaid Other

## 2018-09-12 LAB — CBC
HCT: 25.9 % — ABNORMAL LOW (ref 39.0–52.0)
HCT: 28.9 % — ABNORMAL LOW (ref 39.0–52.0)
HCT: 25.5 % — ABNORMAL LOW (ref 39.0–52.0)
HCT: 25.8 % — ABNORMAL LOW (ref 39.0–52.0)
HEMOGLOBIN: 8.5 g/dL — AB (ref 13.0–17.0)
HEMOGLOBIN: 8.7 g/dL — AB (ref 13.0–17.0)
Hemoglobin: 8.6 g/dL — ABNORMAL LOW (ref 13.0–17.0)
Hemoglobin: 9.6 g/dL — ABNORMAL LOW (ref 13.0–17.0)
MCH: 27.6 pg (ref 26.0–34.0)
MCH: 28.2 pg (ref 26.0–34.0)
MCH: 27.5 pg (ref 26.0–34.0)
MCH: 28.5 pg (ref 26.0–34.0)
MCHC: 33.2 g/dL (ref 30.0–36.0)
MCHC: 33.2 g/dL (ref 30.0–36.0)
MCHC: 33.3 g/dL (ref 30.0–36.0)
MCHC: 33.7 g/dL (ref 30.0–36.0)
MCV: 83 fL (ref 80.0–100.0)
MCV: 85 fL (ref 80.0–100.0)
MCV: 82.5 fL (ref 80.0–100.0)
MCV: 84.6 fL (ref 80.0–100.0)
Platelets: 210 10*3/uL (ref 150–400)
Platelets: 241 10*3/uL (ref 150–400)
Platelets: 213 10*3/uL (ref 150–400)
Platelets: 240 10*3/uL (ref 150–400)
RBC: 3.12 MIL/uL — ABNORMAL LOW (ref 4.22–5.81)
RBC: 3.4 MIL/uL — ABNORMAL LOW (ref 4.22–5.81)
RBC: 3.09 MIL/uL — ABNORMAL LOW (ref 4.22–5.81)
RBC: 3.05 MIL/uL — ABNORMAL LOW (ref 4.22–5.81)
RDW: 14.1 % (ref 11.5–15.5)
RDW: 14.2 % (ref 11.5–15.5)
RDW: 13.9 % (ref 11.5–15.5)
RDW: 14.1 % (ref 11.5–15.5)
WBC: 8 10*3/uL (ref 4.0–10.5)
WBC: 8.1 10*3/uL (ref 4.0–10.5)
WBC: 7.2 10*3/uL (ref 4.0–10.5)
WBC: 7.5 10*3/uL (ref 4.0–10.5)
nRBC: 0 % (ref 0.0–0.2)
nRBC: 0 % (ref 0.0–0.2)
nRBC: 0 % (ref 0.0–0.2)
nRBC: 0 % (ref 0.0–0.2)

## 2018-09-12 LAB — COMPREHENSIVE METABOLIC PANEL
ALBUMIN: 1.2 g/dL — AB (ref 3.5–5.0)
ALT: 17 U/L (ref 0–44)
AST: 20 U/L (ref 15–41)
Alkaline Phosphatase: 52 U/L (ref 38–126)
Anion gap: 8 (ref 5–15)
BILIRUBIN TOTAL: 0.5 mg/dL (ref 0.3–1.2)
BUN: 10 mg/dL (ref 6–20)
CO2: 23 mmol/L (ref 22–32)
Calcium: 7.1 mg/dL — ABNORMAL LOW (ref 8.9–10.3)
Chloride: 104 mmol/L (ref 98–111)
Creatinine, Ser: 0.75 mg/dL (ref 0.61–1.24)
GFR calc Af Amer: >60 mL/min (ref >60)
GFR calc non Af Amer: >60 mL/min (ref >60)
GLUCOSE: 297 mg/dL — AB (ref 70–99)
Potassium: 3.4 mmol/L — ABNORMAL LOW (ref 3.5–5.1)
Sodium: 135 mmol/L (ref 135–145)
Total Protein: 3.7 g/dL — ABNORMAL LOW (ref 6.5–8.1)

## 2018-09-12 LAB — MAGNESIUM: Magnesium: 1.6 mg/dL — ABNORMAL LOW (ref 1.7–2.4)

## 2018-09-12 LAB — HEPARIN LEVEL (UNFRACTIONATED): Heparin Unfractionated: <0.1 IU/mL — ABNORMAL LOW (ref 0.30–0.70)

## 2018-09-12 LAB — GLUCOSE, CAPILLARY
Glucose-Capillary: 309 mg/dL — ABNORMAL HIGH (ref 70–99)
Glucose-Capillary: 293 mg/dL — ABNORMAL HIGH (ref 70–99)
Glucose-Capillary: 127 mg/dL — ABNORMAL HIGH (ref 70–99)

## 2018-09-12 LAB — APTT: aPTT: 30 seconds (ref 24–36)

## 2018-09-12 LAB — HEMOGLOBIN AND HEMATOCRIT, BLOOD
HCT: 26.2 % — ABNORMAL LOW (ref 39.0–52.0)
Hemoglobin: 8.7 g/dL — ABNORMAL LOW (ref 13.0–17.0)

## 2018-09-12 MED ORDER — HEPARIN (PORCINE) 25000 UT/250ML-% IV SOLN
1200.0000 [IU]/h | INTRAVENOUS | Status: DC
  Administered 2018-09-13: 750 [IU]/h via INTRAVENOUS
  Administered 2018-09-15: 1200 [IU]/h via INTRAVENOUS
  Filled 2018-09-12 (×3): qty 250

## 2018-09-12 MED ORDER — MAGNESIUM SULFATE 2 GM/50ML IV SOLN
2.0000 g | Freq: Once | INTRAVENOUS | Status: AC
  Administered 2018-09-12: 2 g via INTRAVENOUS
  Filled 2018-09-12: qty 50

## 2018-09-12 MED ORDER — GADOBUTROL 1 MMOL/ML IV SOLN
6.0000 mL | Freq: Once | INTRAVENOUS | Status: AC | PRN
  Administered 2018-09-12: 6 mL via INTRAVENOUS

## 2018-09-12 MED ORDER — POTASSIUM CHLORIDE CRYS ER 20 MEQ PO TBCR
40.0000 meq | EXTENDED_RELEASE_TABLET | Freq: Once | ORAL | Status: AC
  Administered 2018-09-12: 40 meq via ORAL
  Filled 2018-09-12: qty 2

## 2018-09-12 NOTE — Progress Notes (Signed)
ANTICOAGULATION CONSULT NOTE - Initial Consult  Pharmacy Consult for Heparin Indication: Arterial embolism  Allergies  Allergen Reactions  . Lisinopril Anaphylaxis    Patient Measurements: Height: 5\' 11"  (180.3 cm) Weight: 147 lb 0.8 oz (66.7 kg) IBW/kg (Calculated) : 75.3 Heparin Dosing Weight: 66.7  Vital Signs: Temp: 99.1 F (37.3 C) (12/22 0510) Temp Source: Oral (12/22 0510) BP: 130/94 (12/22 0510) Pulse Rate: 101 (12/22 0510)  Labs: Recent Labs    09/10/18 0412 09/11/18 0410 09/11/18 1624 09/12/18 0415 09/12/18 0945  HGB 9.1* 10.0*  --  8.6* 9.6*  HCT 26.7* 29.3*  --  25.9* 28.9*  PLT 151 208  --  210 241  APTT  --   --  30 30  --   HEPARINUNFRC  --   --   --  <0.10*  --   CREATININE 0.57* 0.60*  --  0.75  --     Estimated Creatinine Clearance: 96.1 mL/min (by C-G formula based on SCr of 0.75 mg/dL).   Medical History: Past Medical History:  Diagnosis Date  . Back pain, chronic   . Diabetes ([place])   . Dilated cardiomyopathy ([place])    Echo 2/18: EF 45-50, diff HK, Gr 1 DD, trivial AI/TR  . HLD (hyperlipidemia)   . Hypertension   . IDDM (insulin dependent diabetes mellitus) ([place])   . Ruptured lumbar disc    Assessment: 57 yo male with artial embolus s/p embolectomy. Patient had development of hematoma with compartment syndrome as well as hemorrhagic shock secondary to a bleeding duodenal ulcer. Patient has been maintained on heparin 400 units/hr by Vascular. This was held o/n for dark stools. The decision has been made to restart heparin given risk of losing the limb and now hypercoaguability due to malignancy.    Pharmacy has been asked to dose heparin in this patient for arterial embolism. D/W vascular surgery and attending MD. We will  resume heparin at 600 units/hr and adjust (per attending MD) to the lower end of therapeutic range given recent bleeding.   Goal of Therapy:  Heparin level 0.3-0.5 Monitor platelets by anticoagulation protocol: Yes    Plan:  Heparin drip at 600 units/hr (no bolus) Heparin level and CBC in 6 hours Monitor CBC  Quy Lotts A. Levada Dy, PharmD, [place] Pager: [phone] Please utilize Amion for appropriate phone number to reach the unit pharmacist ([place])   09/12/2018,11:37 AM

## 2018-09-12 NOTE — Progress Notes (Signed)
PROGRESS NOTE        PATIENT DETAILS Name: Ryan Medina Age: 57 y.o. Sex: male Date of Birth: Sep 16, 1961 Admit Date: 09/03/2018 Admitting Physician [name], MD RFF:[id number], No Pcp Per  Brief Narrative: Patient is a 57 y.o. male with history of cocaine use, chronic systolic heart failure presented to the hospital on 12/13 with a right ischemic leg secondary to acute popliteal and tibial embolus, underwent embolectomy on 12/13 and subsequently was placed on a heparin drip.  He unfortunately developed a acute right lower leg hematoma with compartment syndrome requiring fasciotomy on 12/15.  Further hospital course was complicated by development of hemorrhagic shock with acute blood loss anemia secondary to a bleeding duodenal ulcer-requiring ICU transfer-intubation for airway protection and IR evaluation with mesenteric angiogram and GDA embolization.  Upon stability he was transferred back to the triad hospitalist service on 12/19.  See below for further details.  Subjective:  Patient in bed, appears comfortable, denies any headache, no fever, no chest pain or pressure, no shortness of breath , no abdominal pain. No focal weakness.  Assessment/Plan:  Right ischemic leg secondary to popliteal artery/tibial artery embolism-s/p embolectomy on 99/35 complicated by right lower extremity hematoma with compartment syndrome requiring fasciotomy on 12/15:   Source of embolization unclear, required fasciotomy on 09/05/2018 by vascular surgery status post closure of fasciotomy on 09/10/2018.  His course was complicated by large hematoma in the right leg along with significant life-threatening GI bleed.  Is currently on heparin drip , GI bleed and R.Leh hematoma are stable, case discussed with vascular surgeon Dr. Monica Martinez.  Also TEE requested on 09/11/2018 to figure out etiology of his arterial embolus.  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  Upper GI bleeding with hemorrhagic shock and acute blood loss anemia: Developed hemorrhagic shock, was seen by GI, required multiple PRBC transfusions last transfusion on 09/09/2018.  Was seen by GI and eventually underwent gastroduodenal artery embolization on 09/07/2018.  Total 8 units of packed RBC, 1 unit of FFP and 1 unit of platelets transfused.  Currently H&H seems to have stabilized.  H&H closely on full dose anticoagulation which was resumed by vascular surgery on 09/10/2018.  GI note noted from 09/10/2018 (okay with heparin challenge).  Acute hypoxic respiratory failure: Intubated when he developed hemorrhagic shock secondary to GI bleeding, successfully extubated-currently stable on 2 L of oxygen via nasal cannula.  Chronic systolic heart failure (EF 45-50% by TTE on 09/04/2018): Reasonably well compensated-follow weights, volume status, electrolytes closely.  Cocaine use/tobacco use: Counseled-I am still not sure if he has any intention of quitting.  3 cm AAA: Stable for follow-up in the outpatient setting  ? new pancreatic mass noted on CT scan on 09/11/2018.  MRI suspicious for adenocarcinoma, CA19.9 ordered, GI following.  Insulin-dependent DM-2: Last A1c on 12/2 was 18.2.  Since he was n.p.o.-and on a clear liquid diet-he was allowed to have mild hyperglycemia-since CBGs are now stable-we will start low-dose Lantus at 10 units nightly.  Follow and adjust accordingly.  CBG (last 3)  Recent Labs    09/11/18 1246 09/11/18 1655 09/11/18 2048  GLUCAP 147* 196* 212*      DVT Prophylaxis: Heparin full dose started on 09/10/2018 by vascular surgery  Code Status: Full code   Family Communication: None at bedside  Disposition Plan: Remain  inpatient-requires several more days of hospitalization before consideration of discharge  Antimicrobial agents: Anti-infectives (From admission, onward)   Start     Dose/Rate Route Frequency Ordered  Stop   09/10/18 2130  ceFAZolin (ANCEF) IVPB 2g/100 mL premix     2 g 200 mL/hr over 30 Minutes Intravenous Every 8 hours 09/10/18 1634 09/11/18 0700   09/10/18 0800  ceFAZolin (ANCEF) IVPB 1 g/50 mL premix    Note to Pharmacy:  Send with pt to OR   1 g 100 mL/hr over 30 Minutes Intravenous On call 09/09/18 0756 09/10/18 1407   09/07/18 1300  erythromycin 250 mg in sodium chloride 0.9 % 100 mL IVPB     250 mg 100 mL/hr over 60 Minutes Intravenous Once 09/07/18 1125 09/07/18 2351   09/06/18 0000  ceFAZolin (ANCEF) IVPB 1 g/50 mL premix    Note to Pharmacy:  Send with pt to OR   1 g 100 mL/hr over 30 Minutes Intravenous On call 09/05/18 0849 09/05/18 0952   09/03/18 2245  ceFAZolin (ANCEF) IVPB 2g/100 mL premix     2 g 200 mL/hr over 30 Minutes Intravenous Every 8 hours 09/03/18 2236 09/04/18 1444   09/03/18 1615  ceFAZolin (ANCEF) IVPB 2g/100 mL premix  Status:  Discontinued     2 g 200 mL/hr over 30 Minutes Intravenous On call to O.R. 09/03/18 1600 09/03/18 2035      Procedures: 12/17>>Mesenteric angiogram and coil embolization of the GDA  12/17>>EGD  12/15>> 1.  Evacuation hematoma right leg 2.  Evacuation of lymphocele right leg 3.  4 compartment fasciotomy 4.  Placement of VAC (medial and lateral)  12/13>> 1.  Ultrasound-guided access to the left common femoral artery 2.  Aortogram with bilateral iliac arteriogram 3.  Selective catheterization of the right external iliac artery with right lower extremity runoff   CONSULTS:  pulmonary/intensive care, vascular surgery and IR  Time spent: 40 minutes-Greater than 50% of this time was spent in counseling, explanation of diagnosis, planning of further management, and coordination of care.  MEDICATIONS: Scheduled Meds: . atorvastatin  20 mg Oral Daily  . chlorhexidine gluconate (MEDLINE KIT)  15 mL Mouth Rinse BID  . insulin aspart  0-9 Units Subcutaneous TID WC  . mouth rinse  15 mL Mouth Rinse BID  . pantoprazole   40 mg Oral BID  . tamsulosin  0.4 mg Oral Daily   Continuous Infusions: . sodium chloride     PRN Meds:.sodium chloride, ipratropium-albuterol, LORazepam, ondansetron **OR** ondansetron (ZOFRAN) IV, oxyCODONE-acetaminophen, polyethylene glycol, potassium chloride, senna-docusate, sodium chloride flush   PHYSICAL EXAM: Vital signs: Vitals:   09/11/18 0521 09/11/18 1429 09/11/18 2051 09/12/18 0510  BP: (!) 130/103 (!) 126/96 (!) 125/91 (!) 130/94  Pulse: 87 88 92 (!) 101  Resp: _0 Temp: 97.7 F (36.5 C) 97.7 F (36.5 C) 99 F (37.2 C) 99.1 F (37.3 C)  TempSrc: Oral Oral Oral Oral  SpO2: 99% 98% 98% 98%  Weight:      Height:       Filed Weights   09/03/18 1650  Weight: 66.7 kg   Body mass index is 20.51 kg/m.   Exam  Awake Alert, Oriented X 3, No new F.N deficits, Normal affect [place].AT,PERRAL Supple Neck,No JVD, No cervical lymphadenopathy appriciated.  Symmetrical Chest wall movement, Good air movement bilaterally, CTAB RRR,No Gallops, Rubs or new Murmurs, No Parasternal Heave +ve B.Sounds, Abd Soft, No tenderness, No organomegaly appriciated, No rebound - guarding or rigidity.  No Cyanosis, Clubbing or edema, No new Rash or bruise   I have personally reviewed following labs and imaging studies  LABORATORY DATA: CBC: Recent Labs  Lab 09/07/18 1633 09/08/18 0255 09/08/18 1454 09/08/18 2326  09/09/18 0711 09/09/18 1650 09/10/18 0412 09/11/18 0410 09/12/18 0415 09/12/18 0945  WBC 10.8* 11.3* 12.3* 13.4*  --  12.4*  --  10.4 11.0* 8.0 8.1  NEUTROABS 10.4* 9.5* 11.2* 11.5*  --  11.5*  --   --   --   --   --   HGB 6.8* 8.7* 7.9* 7.0*   < > 7.3* 9.2* 9.1* 10.0* 8.6* 9.6*  HCT 20.3* 25.9* 24.5* 21.4*   < > 22.3* 27.1* 26.7* 29.3* 25.9* 28.9*  MCV 84.6 85.8 85.4 83.9  --  84.8  --  84.8 84.9 83.0 85.0  PLT 216 231 165 146*  --  140*  --  151 208 210 241   < > = values in this interval not displayed.    Basic Metabolic Panel: Recent Labs  Lab  09/07/18 0349  09/08/18 0500 09/09/18 0437 09/09/18 0711 09/10/18 0412 09/11/18 0410 09/12/18 0415  NA 136   < > 141  --  133* 133* 133* 135  K 3.9   < > 4.0  --  3.5 3.3* 3.6 3.4*  CL 96*   < > 110  --  103 102 100 104  CO2 30   < > 25  --  _0 GLUCOSE 112*   < > 94  --  290* 221* 294* 297*  BUN 26*   < > 32*  --  _1 CREATININE 0.78   < > 0.69  --  0.60* 0.57* 0.60* 0.75  CALCIUM 7.2*   < > 7.3*  --  6.8* 7.0* 7.0* 7.1*  MG 2.0  --  2.1 1.8  --  1.8  --  1.6*  PHOS  --   --  2.8 2.0*  --  2.3*  --   --    < > = values in this interval not displayed.    GFR: Estimated Creatinine Clearance: 96.1 mL/min (by C-G formula based on SCr of 0.75 mg/dL).  Liver Function Tests: Recent Labs  Lab 09/07/18 1800 09/08/18 0500 09/09/18 0711 09/10/18 0412 09/12/18 0415  AST 26  --  32 23 20  ALT 15  --  _2 ALKPHOS 24*  --  44 52 52  BILITOT 0.6  --  0.7 0.6 0.5  PROT 3.3*  --  3.5* 3.8* 3.7*  ALBUMIN 1.4* 1.5* 1.3* 1.3* 1.2*   No results for input(s): LIPASE, AMYLASE in the last 168 hours. Recent Labs  Lab 09/07/18 1630  AMMONIA 25    Coagulation Profile: Recent Labs  Lab 09/07/18 1633  INR 1.28    Cardiac Enzymes: No results for input(s): CKTOTAL, CKMB, CKMBINDEX, TROPONINI in the last 168 hours.  BNP (last 3 results) No results for input(s): PROBNP in the last 8760 hours.  HbA1C: No results for input(s): HGBA1C in the last 72 hours.  CBG: Recent Labs  Lab 09/10/18 2243 09/11/18 0803 09/11/18 1246 09/11/18 1655 09/11/18 2048  GLUCAP 243* 243* 147* 196* 212*    Lipid Profile: No results for input(s): CHOL, HDL, LDLCALC, TRIG, CHOLHDL, LDLDIRECT in the last 72 hours.  Thyroid Function Tests: No results for input(s): TSH, T4TOTAL, FREET4, T3FREE, THYROIDAB in the last 72 hours.  Anemia Panel: No results for input(s): VITAMINB12,  FOLATE, FERRITIN, TIBC, IRON, RETICCTPCT in the last 72 hours.  Urine analysis:    Component Value  Date/Time   COLORURINE STRAW (A) 08/22/2018 2054   APPEARANCEUR CLEAR 08/22/2018 2054   LABSPEC 1.027 08/22/2018 2054   PHURINE 6.0 08/22/2018 2054   GLUCOSEU >=500 (A) 08/22/2018 2054   HGBUR NEGATIVE 08/22/2018 2054   [place] NEGATIVE 08/22/2018 2054   KETONESUR 80 (A) 08/22/2018 2054   PROTEINUR NEGATIVE 08/22/2018 2054   UROBILINOGEN 4.0 (H) 12/05/2016 0921   NITRITE NEGATIVE 08/22/2018 2054   LEUKOCYTESUR NEGATIVE 08/22/2018 2054    Sepsis Labs: Lactic Acid, Venous    Component Value Date/Time   LATICACIDVEN 1.7 09/07/2018 1600    MICROBIOLOGY: Recent Results (from the past 240 hour(s))  Surgical pcr screen     Status: None   Collection Time: 09/10/18  3:39 AM  Result Value Ref Range Status   MRSA, PCR NEGATIVE NEGATIVE Final   Staphylococcus aureus NEGATIVE NEGATIVE Final    Comment: (NOTE) The Xpert SA Assay (FDA approved for NASAL specimens in patients 54 years of age and older), is one component of a comprehensive surveillance program. It is not intended to diagnose infection nor to guide or monitor treatment. Performed at [facility], [place] [address], [place], [place] [address]     RADIOLOGY STUDIES/RESULTS: Dg Abd 1 View  Result Date: 09/07/2018 CLINICAL DATA:  OG tube placement. EXAM: ABDOMEN - 1 VIEW COMPARISON:  CT abdomen pelvis 08/22/2018. FINDINGS: OG tube is in place with the side port and tip in the body of the stomach. The stomach is distended. IMPRESSION: OG tube in good position. Electronically Signed   By: Inge Rise M.D.   On: 09/07/2018 13:31   Ct Angio Chest Pe W Or Wo Contrast  Result Date: 09/06/2018 CLINICAL DATA:  Evaluate thoracic aortic aneurysm. EXAM: CT ANGIOGRAPHY CHEST WITH CONTRAST TECHNIQUE: Multidetector CT imaging of the chest was performed using the standard protocol during bolus administration of intravenous contrast. Multiplanar CT image reconstructions and MIPs were obtained to evaluate the vascular anatomy.  CONTRAST:  65 mL ISOVUE-370 IOPAMIDOL (ISOVUE-370) INJECTION 76% COMPARISON:  01/30/2018; 10/14/2016; 04/25/2026 FINDINGS: Vascular Findings: No evidence of thoracic aortic aneurysm with measurements as follows. No definite evidence of thoracic aortic dissection or periaortic stranding on this nongated examination. Bovine configuration of the aortic arch. The branch vessels of the aortic arch appear widely patent throughout their imaged courses. The descending thoracic aorta is of normal caliber and widely patent without hemodynamically significant stenosis. There is a very minimal amount of atherosclerotic plaque involving the proximal aspect of the descending thoracic aorta. Cardiomegaly.  No pericardial effusion. Although this examination was not tailored for the evaluation the pulmonary arteries, there are no discrete filling defects within the central pulmonary arterial tree to suggest central pulmonary embolism. Borderline enlarged caliber the main pulmonary artery measuring 32 mm in diameter (image 67, series 6) ------------------------------------------------------------- Thoracic aortic measurements: Sinotubular junction 33 mm as measured in greatest oblique coronal dimension. Proximal ascending aorta 35 mm as measured in greatest oblique axial dimension at the level of the main pulmonary artery (image 70, series 6); and approximately 36 mm in greatest oblique short axis coronal diameter (coronal image 48, series 9). Aortic arch aorta 28 mm as measured in greatest oblique sagittal dimension. Proximal descending thoracic aorta 30 mm as measured in greatest oblique axial dimension at the level of the main pulmonary artery. Distal descending thoracic aorta 29 mm as measured in greatest oblique axial dimension at  the level of the diaphragmatic hiatus. Review of the MIP images confirms the above findings. ------------------------------------------------------------- Non-Vascular Findings: Mediastinum/Lymph  Nodes: Prominent right hilar lymph nodes are not enlarged by size criteria with index right suprahilar lymph node measuring 0.8 cm in greatest short axis diameter (image 71, series 6) and index right infrahilar lymph node measuring 0.7 cm (image 88). No definitive bulky mediastinal, hilar or axillary lymphadenopathy. Lungs/Pleura: Interval development of small bilateral pleural effusions with worsening bibasilar consolidative opacities and associated air bronchograms, right greater than left. Interval development of an approximately 1.0 x 0.5 cm subpleural consolidative opacity within the right upper lobe (image 36, series 7). Unchanged punctate (approximately 3 mm) calcified granuloma within the left lower lobe (image 75, series 3). Evaluation for additional pulmonary nodules is degraded secondary to bibasilar airspace opacities. The central pulmonary airways appear widely patent. Upper abdomen: Fluid distention of the mid and distal aspects of the esophagus with marked distension of the imaged superior aspect of the stomach. Musculoskeletal: No acute or aggressive osseous abnormalities. There is incomplete fusion involving the spinous processes of multiple thoracic vertebral bodies, unchanged. Regional soft tissues appear normal. Normal appearance of the thyroid gland. IMPRESSION: 1. No evidence of thoracic aortic aneurysm or dissection. 2. Small bilateral effusions with associated bibasilar consolidative opacities with associated air bronchograms, atelectasis versus infiltrate. Follow-up chest radiograph in 4-6 weeks after treatment is recommended to ensure resolution. 3. Fluid distension of the mid and distal aspects of the esophagus as well as marked distension of the imaged superior aspect stomach - correlation for gastric outlet obstructive symptoms is advised. 4. Cardiomegaly with enlargement of the caliber the main pulmonary artery as could be seen in the setting of pulmonary arterial hypertension.  Electronically Signed   By: Sandi Mariscal M.D.   On: 09/06/2018 09:20   Mr Abdomen W Wo Contrast  Result Date: 09/12/2018 CLINICAL DATA:  Suspicion of pancreatic neck mass on CT. EXAM: MRI ABDOMEN WITHOUT AND WITH CONTRAST TECHNIQUE: Multiplanar multisequence MR imaging of the abdomen was performed both before and after the administration of intravenous contrast. CONTRAST:  6 cc Gadavist COMPARISON:  CT of 1 day prior. Stone study of 08/22/2018. More remote CT of 12/19/2017. FINDINGS: Moderatemultifactorial degradation, including respiratory motion and susceptibility artifact from embolization coils. Lower chest: Bilateral pleural fluid with bibasilar airspace disease. Normal heart size. Hepatobiliary: Left hepatic lobe 9 mm cyst. Too small to characterizea right hepatic lobe lesion is most likely a cyst at 2 mm. Normal gallbladder, without biliary ductal dilatation. Pancreas: An area of soft tissue fullness and restricted diffusion is identified within the pancreatic neck, corresponding to the abnormality on CT. This measures on the order of 3.9 x 3.6 cm on image 76/12. Also 3.6 x 3.6 cm on image 35/31. Results in pancreatic duct dilatation within the upstream body and tail on image 17/11. Involves the celiac and its branches including on image 75/12. Spleen:  Grossly within normal limits. Adrenals/Urinary Tract: Normal adrenal glands. Grossly normal kidneys, without hydronephrosis. Stomach/Bowel: Gastric distension with proximal wall and fold thickening, including on image 73/12. Grossly normal abdominal bowel loops. Vascular/Lymphatic: Normal caliber of the aorta. 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 Limited evaluation for abdominal adenopathy. Other:  Small volume abdominal ascites. Musculoskeletal: No acute osseous abnormality. IMPRESSION: 1. Moderate motion and susceptibility artifact degradation throughout. 2. Confirmation of a pancreatic neck mass, most consistent with adenocarcinoma.  Involvement of the celiac and its branches, most consistent with a non-operative lesion. 3. Consider endoscopic ultrasound sampling.  4. Gastric wall and fold thickening, suggesting gastritis. 5. Small bilateral pleural effusions with adjacent airspace disease. Electronically Signed   By: Abigail Miyamoto M.D.   On: 09/12/2018 03:04   Ir Angiogram Visceral Selective  Result Date: 09/07/2018 INDICATION: 57 year old male with large volume upper GI bleed secondary to a very large duodenal ulcer. Patient underwent endoscopic intervention but continues to require pressors and large volume transfusion. He presents for mesenteric arteriography and embolization of any active bleeding and prophylactic embolization of the gastroduodenal artery. EXAM: IR ULTRASOUND GUIDANCE VASC ACCESS RIGHT; ADDITIONAL ARTERIOGRAPHY; IR EMBO ART VEN HEMORR LYMPH EXTRAV INC GUIDE ROADMAPPING; SELECTIVE VISCERAL ARTERIOGRAPHY 1. Ultrasound-guided vascular access 2. Catheterization of the left gastric artery where it arises directly from the aorta with arteriogram 3. Catheterization of the celiac artery with arteriogram 4. Catheterization of the common hepatic artery with arteriogram 5. Catheterization of the gastroduodenal artery with arteriogram 6. Coil embolization of the gastroduodenal artery 7. Catheterization of the superior mesenteric artery with arteriogram MEDICATIONS: None ANESTHESIA/SEDATION: Patient intubated and on propofol drip CONTRAST:  70 mL Isovue 370 FLUOROSCOPY TIME:  Fluoroscopy Time: 11 minutes 30 seconds (487 mGy). COMPLICATIONS: None immediate. PROCEDURE: Informed consent was obtained from the patient following explanation of the procedure, risks, benefits and alternatives. The patient understands, agrees and consents for the procedure. All questions were addressed. A time out was performed prior to the initiation of the procedure. Maximal barrier sterile technique utilized including caps, mask, sterile gowns, sterile  gloves, large sterile drape, hand hygiene, and Betadine prep. The right common femoral artery was interrogated with ultrasound and found to be widely patent. An image was obtained and stored for the medical record. Local anesthesia was attained by infiltration with 1% lidocaine. A small dermatotomy was made. Under real-time sonographic guidance, the vessel was punctured with a 21 gauge micropuncture needle. Using standard technique, the initial micro needle was exchanged over a 0.018 micro wire for a transitional 4 [place] micro sheath. The micro sheath was then exchanged over a 0.035 wire for a 5 French vascular sheath. A C2 cobra catheter was advanced in the abdominal aorta over a Bentson wire. The catheter was used to select the first vessel arising from the ventral aorta. Contrast was injected. This is a left gastric artery arising directly from the aorta. Anatomy is normal. No evidence of active bleeding. The Cobra catheter was then used to select the origin of the celiac artery. Arteriography was performed. The splenic artery is small in caliber. The hepatic artery demonstrates normal hepatic arterial anatomy. The gastroduodenal artery is slightly irregular with spasm in the proximal segment. No evidence of active extravasation. A glidewire was successfully advanced into the common hepatic artery and the C2 cobra catheter was advanced into the common hepatic artery. Additional arteriography was performed in multiple obliquities. Again, the proximal gastroduodenal artery is irregular and spasmodic. No evidence of dissection, aneurysm or active bleeding. A Lantern microcatheter was then advanced over a Fathom 16 wire and used to select the distal aspect of the gastroduodenal artery. Arteriography was performed. The gastroepiploic arteries are widely patent. No evidence of active bleeding. Coil embolization was then performed using a 4 mm Ruby POD detachable micro coil followed by a 45 cm Ruby packing coil. Post  embolization arteriography demonstrates complete occlusion of the vessel with stasis in the proximal segment. The Lantern microcatheter was brought back into the common hepatic artery and arteriography was performed. Again, complete occlusion of the gastroduodenal artery. The right gastric artery is visualized.  No evidence of active bleeding from the right gastric artery. The microcatheter was removed. The C2 cobra catheter was used to select the superior mesenteric artery. Arteriography was performed. Patent pancreaticoduodenal arcade supplying the gastroepiploic artery. No evidence of active hemorrhage. The C2 cobra catheter was removed. A limited right common femoral arteriogram was performed confirming common femoral arterial access. Hemostasis was attained with the assistance of a Cordis ExoSeal extra arterial vascular plug. IMPRESSION: 1. No evidence of active hemorrhage. 2. 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 3. Prophylactic coil embolization of the gastroduodenal artery. Signed, Criselda Peaches, MD, [facility] Radiology Electronically Signed   By: Jacqulynn Cadet M.D.   On: 09/07/2018 22:32   Ir Angiogram Visceral Selective  Result Date: 09/07/2018 INDICATION: 57 year old male with large volume upper GI bleed secondary to a very large duodenal ulcer. Patient underwent endoscopic intervention but continues to require pressors and large volume transfusion. He presents for mesenteric arteriography and embolization of any active bleeding and prophylactic embolization of the gastroduodenal artery. EXAM: IR ULTRASOUND GUIDANCE VASC ACCESS RIGHT; ADDITIONAL ARTERIOGRAPHY; IR EMBO ART VEN HEMORR LYMPH EXTRAV INC GUIDE ROADMAPPING; SELECTIVE VISCERAL ARTERIOGRAPHY 1. Ultrasound-guided vascular access 2. Catheterization of the left gastric artery where it arises directly from the aorta with arteriogram 3. Catheterization of  the celiac artery with arteriogram 4. Catheterization of the common hepatic artery with arteriogram 5. Catheterization of the gastroduodenal artery with arteriogram 6. Coil embolization of the gastroduodenal artery 7. Catheterization of the superior mesenteric artery with arteriogram MEDICATIONS: None ANESTHESIA/SEDATION: Patient intubated and on propofol drip CONTRAST:  70 mL Isovue 370 FLUOROSCOPY TIME:  Fluoroscopy Time: 11 minutes 30 seconds (487 mGy). COMPLICATIONS: None immediate. PROCEDURE: Informed consent was obtained from the patient following explanation of the procedure, risks, benefits and alternatives. The patient understands, agrees and consents for the procedure. All questions were addressed. A time out was performed prior to the initiation of the procedure. Maximal barrier sterile technique utilized including caps, mask, sterile gowns, sterile gloves, large sterile drape, hand hygiene, and Betadine prep. The right common femoral artery was interrogated with ultrasound and found to be widely patent. An image was obtained and stored for the medical record. Local anesthesia was attained by infiltration with 1% lidocaine. A small dermatotomy was made. Under real-time sonographic guidance, the vessel was punctured with a 21 gauge micropuncture needle. Using standard technique, the initial micro needle was exchanged over a 0.018 micro wire for a transitional 4 [place] micro sheath. The micro sheath was then exchanged over a 0.035 wire for a 5 French vascular sheath. A C2 cobra catheter was advanced in the abdominal aorta over a Bentson wire. The catheter was used to select the first vessel arising from the ventral aorta. Contrast was injected. This is a left gastric artery arising directly from the aorta. Anatomy is normal. No evidence of active bleeding. The Cobra catheter was then used to select the origin of the celiac artery. Arteriography was performed. The splenic artery is small in caliber. The  hepatic artery demonstrates normal hepatic arterial anatomy. The gastroduodenal artery is slightly irregular with spasm in the proximal segment. No evidence of active extravasation. A glidewire was successfully advanced into the common hepatic artery and the C2 cobra catheter was advanced into the common hepatic artery. Additional arteriography was performed in multiple obliquities. Again, the proximal gastroduodenal artery is irregular and spasmodic. No evidence of dissection, aneurysm or active bleeding. A Lantern microcatheter was then advanced over a  Fathom 16 wire and used to select the distal aspect of the gastroduodenal artery. Arteriography was performed. The gastroepiploic arteries are widely patent. No evidence of active bleeding. Coil embolization was then performed using a 4 mm Ruby POD detachable micro coil followed by a 45 cm Ruby packing coil. Post embolization arteriography demonstrates complete occlusion of the vessel with stasis in the proximal segment. The Lantern microcatheter was brought back into the common hepatic artery and arteriography was performed. Again, complete occlusion of the gastroduodenal artery. The right gastric artery is visualized. No evidence of active bleeding from the right gastric artery. The microcatheter was removed. The C2 cobra catheter was used to select the superior mesenteric artery. Arteriography was performed. Patent pancreaticoduodenal arcade supplying the gastroepiploic artery. No evidence of active hemorrhage. The C2 cobra catheter was removed. A limited right common femoral arteriogram was performed confirming common femoral arterial access. Hemostasis was attained with the assistance of a Cordis ExoSeal extra arterial vascular plug. IMPRESSION: 1. No evidence of active hemorrhage. 2. 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 3. Prophylactic coil embolization of the gastroduodenal artery. Signed, Criselda Peaches, MD, [place] [facility] Radiology Electronically Signed   By: Jacqulynn Cadet M.D.   On: 09/07/2018 22:32   Ir Angiogram Selective Each Additional Vessel  Result Date: 09/07/2018 INDICATION: 57 year old male with large volume upper GI bleed secondary to a very large duodenal ulcer. Patient underwent endoscopic intervention but continues to require pressors and large volume transfusion. He presents for mesenteric arteriography and embolization of any active bleeding and prophylactic embolization of the gastroduodenal artery. EXAM: IR ULTRASOUND GUIDANCE VASC ACCESS RIGHT; ADDITIONAL ARTERIOGRAPHY; IR EMBO ART VEN HEMORR LYMPH EXTRAV INC GUIDE ROADMAPPING; SELECTIVE VISCERAL ARTERIOGRAPHY 1. Ultrasound-guided vascular access 2. Catheterization of the left gastric artery where it arises directly from the aorta with arteriogram 3. Catheterization of the celiac artery with arteriogram 4. Catheterization of the common hepatic artery with arteriogram 5. Catheterization of the gastroduodenal artery with arteriogram 6. Coil embolization of the gastroduodenal artery 7. Catheterization of the superior mesenteric artery with arteriogram MEDICATIONS: None ANESTHESIA/SEDATION: Patient intubated and on propofol drip CONTRAST:  70 mL Isovue 370 FLUOROSCOPY TIME:  Fluoroscopy Time: 11 minutes 30 seconds (487 mGy). COMPLICATIONS: None immediate. PROCEDURE: Informed consent was obtained from the patient following explanation of the procedure, risks, benefits and alternatives. The patient understands, agrees and consents for the procedure. All questions were addressed. A time out was performed prior to the initiation of the procedure. Maximal barrier sterile technique utilized including caps, mask, sterile gowns, sterile gloves, large sterile drape, hand hygiene, and Betadine prep. The right common femoral artery was interrogated with ultrasound and found to be widely patent. An image was obtained and  stored for the medical record. Local anesthesia was attained by infiltration with 1% lidocaine. A small dermatotomy was made. Under real-time sonographic guidance, the vessel was punctured with a 21 gauge micropuncture needle. Using standard technique, the initial micro needle was exchanged over a 0.018 micro wire for a transitional 4 [place] micro sheath. The micro sheath was then exchanged over a 0.035 wire for a 5 French vascular sheath. A C2 cobra catheter was advanced in the abdominal aorta over a Bentson wire. The catheter was used to select the first vessel arising from the ventral aorta. Contrast was injected. This is a left gastric artery arising directly from the aorta. Anatomy is normal. No evidence of active bleeding. The Cobra catheter was then used to select  the origin of the celiac artery. Arteriography was performed. The splenic artery is small in caliber. The hepatic artery demonstrates normal hepatic arterial anatomy. The gastroduodenal artery is slightly irregular with spasm in the proximal segment. No evidence of active extravasation. A glidewire was successfully advanced into the common hepatic artery and the C2 cobra catheter was advanced into the common hepatic artery. Additional arteriography was performed in multiple obliquities. Again, the proximal gastroduodenal artery is irregular and spasmodic. No evidence of dissection, aneurysm or active bleeding. A Lantern microcatheter was then advanced over a Fathom 16 wire and used to select the distal aspect of the gastroduodenal artery. Arteriography was performed. The gastroepiploic arteries are widely patent. No evidence of active bleeding. Coil embolization was then performed using a 4 mm Ruby POD detachable micro coil followed by a 45 cm Ruby packing coil. Post embolization arteriography demonstrates complete occlusion of the vessel with stasis in the proximal segment. The Lantern microcatheter was brought back into the common hepatic artery  and arteriography was performed. Again, complete occlusion of the gastroduodenal artery. The right gastric artery is visualized. No evidence of active bleeding from the right gastric artery. The microcatheter was removed. The C2 cobra catheter was used to select the superior mesenteric artery. Arteriography was performed. Patent pancreaticoduodenal arcade supplying the gastroepiploic artery. No evidence of active hemorrhage. The C2 cobra catheter was removed. A limited right common femoral arteriogram was performed confirming common femoral arterial access. Hemostasis was attained with the assistance of a Cordis ExoSeal extra arterial vascular plug. IMPRESSION: 1. No evidence of active hemorrhage. 2. 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 3. Prophylactic coil embolization of the gastroduodenal artery. Signed, Criselda Peaches, MD, [place] [facility] Radiology Electronically Signed   By: Jacqulynn Cadet M.D.   On: 09/07/2018 22:32   Ir Angiogram Selective Each Additional Vessel  Result Date: 09/07/2018 INDICATION: 57 year old male with large volume upper GI bleed secondary to a very large duodenal ulcer. Patient underwent endoscopic intervention but continues to require pressors and large volume transfusion. He presents for mesenteric arteriography and embolization of any active bleeding and prophylactic embolization of the gastroduodenal artery. EXAM: IR ULTRASOUND GUIDANCE VASC ACCESS RIGHT; ADDITIONAL ARTERIOGRAPHY; IR EMBO ART VEN HEMORR LYMPH EXTRAV INC GUIDE ROADMAPPING; SELECTIVE VISCERAL ARTERIOGRAPHY 1. Ultrasound-guided vascular access 2. Catheterization of the left gastric artery where it arises directly from the aorta with arteriogram 3. Catheterization of the celiac artery with arteriogram 4. Catheterization of the common hepatic artery with arteriogram 5. Catheterization of the gastroduodenal artery with arteriogram 6.  Coil embolization of the gastroduodenal artery 7. Catheterization of the superior mesenteric artery with arteriogram MEDICATIONS: None ANESTHESIA/SEDATION: Patient intubated and on propofol drip CONTRAST:  70 mL Isovue 370 FLUOROSCOPY TIME:  Fluoroscopy Time: 11 minutes 30 seconds (487 mGy). COMPLICATIONS: None immediate. PROCEDURE: Informed consent was obtained from the patient following explanation of the procedure, risks, benefits and alternatives. The patient understands, agrees and consents for the procedure. All questions were addressed. A time out was performed prior to the initiation of the procedure. Maximal barrier sterile technique utilized including caps, mask, sterile gowns, sterile gloves, large sterile drape, hand hygiene, and Betadine prep. The right common femoral artery was interrogated with ultrasound and found to be widely patent. An image was obtained and stored for the medical record. Local anesthesia was attained by infiltration with 1% lidocaine. A small dermatotomy was made. Under real-time sonographic guidance, the vessel was punctured with a 21 gauge micropuncture needle.  Using standard technique, the initial micro needle was exchanged over a 0.018 micro wire for a transitional 4 [place] micro sheath. The micro sheath was then exchanged over a 0.035 wire for a 5 French vascular sheath. A C2 cobra catheter was advanced in the abdominal aorta over a Bentson wire. The catheter was used to select the first vessel arising from the ventral aorta. Contrast was injected. This is a left gastric artery arising directly from the aorta. Anatomy is normal. No evidence of active bleeding. The Cobra catheter was then used to select the origin of the celiac artery. Arteriography was performed. The splenic artery is small in caliber. The hepatic artery demonstrates normal hepatic arterial anatomy. The gastroduodenal artery is slightly irregular with spasm in the proximal segment. No evidence of active  extravasation. A glidewire was successfully advanced into the common hepatic artery and the C2 cobra catheter was advanced into the common hepatic artery. Additional arteriography was performed in multiple obliquities. Again, the proximal gastroduodenal artery is irregular and spasmodic. No evidence of dissection, aneurysm or active bleeding. A Lantern microcatheter was then advanced over a Fathom 16 wire and used to select the distal aspect of the gastroduodenal artery. Arteriography was performed. The gastroepiploic arteries are widely patent. No evidence of active bleeding. Coil embolization was then performed using a 4 mm Ruby POD detachable micro coil followed by a 45 cm Ruby packing coil. Post embolization arteriography demonstrates complete occlusion of the vessel with stasis in the proximal segment. The Lantern microcatheter was brought back into the common hepatic artery and arteriography was performed. Again, complete occlusion of the gastroduodenal artery. The right gastric artery is visualized. No evidence of active bleeding from the right gastric artery. The microcatheter was removed. The C2 cobra catheter was used to select the superior mesenteric artery. Arteriography was performed. Patent pancreaticoduodenal arcade supplying the gastroepiploic artery. No evidence of active hemorrhage. The C2 cobra catheter was removed. A limited right common femoral arteriogram was performed confirming common femoral arterial access. Hemostasis was attained with the assistance of a Cordis ExoSeal extra arterial vascular plug. IMPRESSION: 1. No evidence of active hemorrhage. 2. 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 3. Prophylactic coil embolization of the gastroduodenal artery. Signed, Criselda Peaches, MD, [place] [facility] Radiology Electronically Signed   By: Jacqulynn Cadet M.D.   On: 09/07/2018 22:32   Ir US Guide Vasc Access  Right  Result Date: 09/07/2018 INDICATION: 57 year old male with large volume upper GI bleed secondary to a very large duodenal ulcer. Patient underwent endoscopic intervention but continues to require pressors and large volume transfusion. He presents for mesenteric arteriography and embolization of any active bleeding and prophylactic embolization of the gastroduodenal artery. EXAM: IR ULTRASOUND GUIDANCE VASC ACCESS RIGHT; ADDITIONAL ARTERIOGRAPHY; IR EMBO ART VEN HEMORR LYMPH EXTRAV INC GUIDE ROADMAPPING; SELECTIVE VISCERAL ARTERIOGRAPHY 1. Ultrasound-guided vascular access 2. Catheterization of the left gastric artery where it arises directly from the aorta with arteriogram 3. Catheterization of the celiac artery with arteriogram 4. Catheterization of the common hepatic artery with arteriogram 5. Catheterization of the gastroduodenal artery with arteriogram 6. Coil embolization of the gastroduodenal artery 7. Catheterization of the superior mesenteric artery with arteriogram MEDICATIONS: None ANESTHESIA/SEDATION: Patient intubated and on propofol drip CONTRAST:  70 mL Isovue 370 FLUOROSCOPY TIME:  Fluoroscopy Time: 11 minutes 30 seconds (487 mGy). COMPLICATIONS: None immediate. PROCEDURE: Informed consent was obtained from the patient following explanation of the procedure, risks, benefits and alternatives. The patient understands,  agrees and consents for the procedure. All questions were addressed. A time out was performed prior to the initiation of the procedure. Maximal barrier sterile technique utilized including caps, mask, sterile gowns, sterile gloves, large sterile drape, hand hygiene, and Betadine prep. The right common femoral artery was interrogated with ultrasound and found to be widely patent. An image was obtained and stored for the medical record. Local anesthesia was attained by infiltration with 1% lidocaine. A small dermatotomy was made. Under real-time sonographic guidance, the vessel was  punctured with a 21 gauge micropuncture needle. Using standard technique, the initial micro needle was exchanged over a 0.018 micro wire for a transitional 4 [place] micro sheath. The micro sheath was then exchanged over a 0.035 wire for a 5 French vascular sheath. A C2 cobra catheter was advanced in the abdominal aorta over a Bentson wire. The catheter was used to select the first vessel arising from the ventral aorta. Contrast was injected. This is a left gastric artery arising directly from the aorta. Anatomy is normal. No evidence of active bleeding. The Cobra catheter was then used to select the origin of the celiac artery. Arteriography was performed. The splenic artery is small in caliber. The hepatic artery demonstrates normal hepatic arterial anatomy. The gastroduodenal artery is slightly irregular with spasm in the proximal segment. No evidence of active extravasation. A glidewire was successfully advanced into the common hepatic artery and the C2 cobra catheter was advanced into the common hepatic artery. Additional arteriography was performed in multiple obliquities. Again, the proximal gastroduodenal artery is irregular and spasmodic. No evidence of dissection, aneurysm or active bleeding. A Lantern microcatheter was then advanced over a Fathom 16 wire and used to select the distal aspect of the gastroduodenal artery. Arteriography was performed. The gastroepiploic arteries are widely patent. No evidence of active bleeding. Coil embolization was then performed using a 4 mm Ruby POD detachable micro coil followed by a 45 cm Ruby packing coil. Post embolization arteriography demonstrates complete occlusion of the vessel with stasis in the proximal segment. The Lantern microcatheter was brought back into the common hepatic artery and arteriography was performed. Again, complete occlusion of the gastroduodenal artery. The right gastric artery is visualized. No evidence of active bleeding from the right  gastric artery. The microcatheter was removed. The C2 cobra catheter was used to select the superior mesenteric artery. Arteriography was performed. Patent pancreaticoduodenal arcade supplying the gastroepiploic artery. No evidence of active hemorrhage. The C2 cobra catheter was removed. A limited right common femoral arteriogram was performed confirming common femoral arterial access. Hemostasis was attained with the assistance of a Cordis ExoSeal extra arterial vascular plug. IMPRESSION: 1. No evidence of active hemorrhage. 2. 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 3. Prophylactic coil embolization of the gastroduodenal artery. Signed, Criselda Peaches, MD, [place] [facility] Radiology Electronically Signed   By: Jacqulynn Cadet M.D.   On: 09/07/2018 22:32   Dg Chest Port 1 View  Result Date: 09/09/2018 CLINICAL DATA:  Respiratory failure EXAM: PORTABLE CHEST 1 VIEW COMPARISON:  09/07/2018 FINDINGS: Endotracheal tube and nasogastric catheter have been removed in the interval. Left jugular central line is again noted in the distal superior vena cava. The cardiac shadow is stable. The lungs are well aerated bilaterally with patchy bibasilar infiltrates and small left pleural effusion. IMPRESSION: Stable bibasilar infiltrates and small left pleural effusion. Electronically Signed   By: Inez Catalina M.D.   On: 09/09/2018 07:17   Dg Chest  Port 1 View  Result Date: 09/07/2018 CLINICAL DATA:  Status post fasciotomy and hematoma back UA shins in the right lower leg 2 days ago. Intubated patient. EXAM: PORTABLE CHEST 1 VIEW COMPARISON:  CT scan of the chest of September 06, 2018 FINDINGS: The lungs are well-expanded. There are patchy increased densities at both bases which are slightly more conspicuous today. There is a small right and and somewhat larger left pleural effusion. The heart and pulmonary vascularity are normal. The  endotracheal tube tip projects 4.1 cm above the carina. The esophagogastric tube tip in proximal port project below the GE junction. The left internal jugular venous catheter tip projects over the midportion of the SVC. IMPRESSION: Bibasilar atelectasis or pneumonia. Small right pleural effusion and somewhat larger left pleural effusion. The support tubes are in reasonable position. Electronically Signed   By: David  Martinique M.D.   On: 09/07/2018 13:27   Dg Abd Acute W/chest  Result Date: 08/15/2018 CLINICAL DATA:  Left-sided abdominal pain since July 22, 2018. Weakness and nausea. Vomiting beginning yesterday. Centralized chest pain. EXAM: DG ABDOMEN ACUTE W/ 1V CHEST COMPARISON:  None. FINDINGS: The heart, hila, and mediastinum are normal. No pneumothorax, pulmonary nodule, or infiltrate. No free air, portal venous gas, or pneumatosis. Moderate fecal loading in the colon. No bowel obstruction identified. A rounded calcification to the left of the L4 spinous process is noted. IMPRESSION: 1. Moderate fecal loading throughout the colon. 2. Rounded 2 mm calcification to the left of the L4 spinous process could represent bowel contents. A ureteral stone could not be excluded on this study, however. Recommend clinical correlation. If there is concern for a ureteral stone, CT imaging could better evaluate. Electronically Signed   By: Dorise Bullion III M.D   On: 08/15/2018 19:02   Ct Renal Stone Study  Result Date: 08/22/2018 CLINICAL DATA:  Left flank pain. Nausea vomiting for 4 days. Previous appendectomy. EXAM: CT ABDOMEN AND PELVIS WITHOUT CONTRAST TECHNIQUE: Multidetector CT imaging of the abdomen and pelvis was performed following the standard protocol without IV contrast. COMPARISON:  CT scan December 19, 2017 FINDINGS: Lower chest: No acute abnormality. Hepatobiliary: No focal liver abnormality is seen. No gallstones, gallbladder wall thickening, or biliary dilatation. Pancreas: Unremarkable. No  pancreatic ductal dilatation or surrounding inflammatory changes. Spleen: Normal in size without focal abnormality. Adrenals/Urinary Tract: Adrenal glands are normal. A 3 mm stone is again noted in the mid right kidney. No hydronephrosis or perinephric stranding. Bilateral extrarenal pelvises. No ureterectasis or ureteral stones noted. The bladder is unremarkable. Stomach/Bowel: The stomach is distended. Small bowel is normal. Moderate to severe fecal loading throughout the colon with a stool ball in the rectum. By report, the patient is status post appendectomy. 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 Atherosclerotic changes seen in the aorta, iliac vessels, and femoral vessels. Reproductive: Prostate is unremarkable. Other: No abdominal wall hernia or abnormality. No abdominopelvic ascites. Musculoskeletal: No acute or significant osseous findings. IMPRESSION: 1. Nonobstructive 3 mm stone in the right kidney. No renal obstruction or other stones noted. 2. Gastric distention of uncertain etiology. 3. Moderate to severe fecal loading throughout the colon. 4. Atherosclerotic changes throughout the abdominal aorta and iliac vessels. 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 Recommend followup by ultrasound in 3 years. This recommendation follows ACR consensus guidelines: White Paper of the ACR Incidental Findings Committee II on Vascular Findings. Joellyn Rued Radiol 2013; 40:[id number] Electronically Signed   By: Dorise Bullion III M.D   On: 08/22/2018 19:54   Ir Embo Art  [place] Guide Roadmapping  Result Date: 09/07/2018 INDICATION: 57 year old male with large volume upper GI  bleed secondary to a very large duodenal ulcer. Patient underwent endoscopic intervention but continues to require pressors and large volume transfusion. He presents for mesenteric arteriography and embolization of any active bleeding and prophylactic embolization of the gastroduodenal artery. EXAM: IR ULTRASOUND GUIDANCE VASC ACCESS RIGHT; ADDITIONAL ARTERIOGRAPHY; IR EMBO ART VEN HEMORR LYMPH EXTRAV INC GUIDE ROADMAPPING; SELECTIVE VISCERAL ARTERIOGRAPHY 1. Ultrasound-guided vascular access 2. Catheterization of the left gastric artery where it arises directly from the aorta with arteriogram 3. Catheterization of the celiac artery with arteriogram 4. Catheterization of the common hepatic artery with arteriogram 5. Catheterization of the gastroduodenal artery with arteriogram 6. Coil embolization of the gastroduodenal artery 7. Catheterization of the superior mesenteric artery with arteriogram MEDICATIONS: None ANESTHESIA/SEDATION: Patient intubated and on propofol drip CONTRAST:  70 mL Isovue 370 FLUOROSCOPY TIME:  Fluoroscopy Time: 11 minutes 30 seconds (487 mGy). COMPLICATIONS: None immediate. PROCEDURE: Informed consent was obtained from the patient following explanation of the procedure, risks, benefits and alternatives. The patient understands, agrees and consents for the procedure. All questions were addressed. A time out was performed prior to the initiation of the procedure. Maximal barrier sterile technique utilized including caps, mask, sterile gowns, sterile gloves, large sterile drape, hand hygiene, and Betadine prep. The right common femoral artery was interrogated with ultrasound and found to be widely patent. An image was obtained and stored for the medical record. Local anesthesia was attained by infiltration with 1% lidocaine. A small dermatotomy was made. Under real-time sonographic guidance, the vessel was punctured with a 21 gauge micropuncture needle. Using standard technique, the initial  micro needle was exchanged over a 0.018 micro wire for a transitional 4 [place] micro sheath. The micro sheath was then exchanged over a 0.035 wire for a 5 French vascular sheath. A C2 cobra catheter was advanced in the abdominal aorta over a Bentson wire. The catheter was used to select the first vessel arising from the ventral aorta. Contrast was injected. This is a left gastric artery arising directly from the aorta. Anatomy is normal. No evidence of active bleeding. The Cobra catheter was then used to select the origin of the celiac artery. Arteriography was performed. The splenic artery is small in caliber. The hepatic artery demonstrates normal hepatic arterial anatomy. The gastroduodenal artery is slightly irregular with spasm in the proximal segment. No evidence of active extravasation. A glidewire was successfully advanced into the common hepatic artery and the C2 cobra catheter was advanced into the common hepatic artery. Additional arteriography was performed in multiple obliquities. Again, the proximal gastroduodenal artery is irregular and spasmodic. No evidence of dissection, aneurysm or active bleeding. A Lantern microcatheter was then advanced over a Fathom 16 wire and used to select the distal aspect of the gastroduodenal artery. Arteriography was performed. The gastroepiploic arteries are widely patent. No evidence of active bleeding. Coil embolization was then performed using a 4 mm Ruby POD detachable micro coil followed by a 45 cm Ruby packing coil. Post embolization arteriography demonstrates complete occlusion of the vessel with stasis in  the proximal segment. The Lantern microcatheter was brought back into the common hepatic artery and arteriography was performed. Again, complete occlusion of the gastroduodenal artery. The right gastric artery is visualized. No evidence of active bleeding from the right gastric artery. The microcatheter was removed. The C2 cobra catheter was used to select the  superior mesenteric artery. Arteriography was performed. Patent pancreaticoduodenal arcade supplying the gastroepiploic artery. No evidence of active hemorrhage. The C2 cobra catheter was removed. A limited right common femoral arteriogram was performed confirming common femoral arterial access. Hemostasis was attained with the assistance of a Cordis ExoSeal extra arterial vascular plug. IMPRESSION: 1. No evidence of active hemorrhage. 2. 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 3. Prophylactic coil embolization of the gastroduodenal artery. Signed, Criselda Peaches, MD, [place] [facility] Radiology Electronically Signed   By: Jacqulynn Cadet M.D.   On: 09/07/2018 22:32   Ct Angio Abd/pel W/ And/or W/o  Result Date: 09/11/2018 CLINICAL DATA:  Status postoperative right popliteal and tibial embolectomy for embolic disease. Known bilateral iliac artery aneurysmal disease. EXAM: CT ANGIOGRAPHY ABDOMEN AND PELVIS WITH CONTRAST TECHNIQUE: Multidetector CT imaging of the abdomen and pelvis was performed using the standard protocol during bolus administration of intravenous contrast. Multiplanar reconstructed images and MIPs were obtained and reviewed to evaluate the vascular anatomy. CONTRAST:  125m ISOVUE-370 IOPAMIDOL (ISOVUE-370) INJECTION 76% COMPARISON:  CTA of the abdomen on 01/30/2018 and CT of the abdomen and pelvis with contrast on 12/19/2017. FINDINGS: VASCULAR Aorta: Stable mild dilatation of the infrarenal aorta with maximal diameter of 3.1 cm. Celiac: Mild origin stenosis of 25-30%. Distal branches are patent. Status post transcatheter coil embolization of the gastroduodenal artery on 09/07/2018. Hepatic arteries are normally patent. SMA: Normally patent. Renals: Bilateral single renal arteries demonstrate normal patency. IMA: Normally patent. Inflow: Fusiform aneurysmal disease present of bilateral common iliac arteries as  previously demonstrated. Maximal caliber of the right common iliac artery is 2.8-2.9 cm and maximal caliber of the right common iliac artery is 2.7 cm. These measurements are stable since prior studies. The significant change since the prior studies is a much more prominent and new component of mural thrombus in both common iliac arteries, right greater than left. In the proximal right common iliac artery, mural thrombus narrows the aneurysmal lumen by at least 50% in its narrowest portion. Eccentric mural thrombus is present at the origin of the right common iliac artery and also distally just prior to the common iliac bifurcation without significant luminal stenosis. Aneurysmal disease again noted of bilateral internal iliac arteries with the left measuring approximately 2.5 cm in greatest diameter and the right measuring 1.5 cm. Both internal iliac arteries also demonstrate significant mural thrombus without occlusion. Mural thrombus is stable to slightly more prominent compared to the 12/19/2017 study. Bilateral external iliac arteries are normally patent and demonstrate diffuse arteriomegaly with maximum caliber 1.5 cm. Proximal Outflow: Bilateral common femoral arteries demonstrate dilatation with the left measuring up to 2.1 cm in diameter. Mural thrombus is present along the posterior lumen with approximately 50% reduction in luminal diameter. No thrombus is seen extending into proximal SFA or profunda femoral arteries. On the right side the common femoral artery measures up to 1.8 cm in the femoral bifurcation appears widely patent. Veins: Delayed venous phase imaging shows patent venous structures without evidence of thrombus. There is some probable mass effect of ectatic external iliac arteries on the distal aspects of bilateral external iliac veins without evidence of associated  deep venous thrombosis or significant collateral vein formation. Review of the MIP images confirms the above findings.  NON-VASCULAR Lower chest: Small bilateral pleural effusions with bibasilar atelectasis. Hepatobiliary: No focal liver abnormality is seen. No gallstones, gallbladder wall thickening, or biliary dilatation. Pancreas: Since the March study, there is a change in appearance of the pancreas on the venous phase of imaging. The tail of the pancreas now appears progressively atrophic with associated pancreatic ductal dilatation up to approximately 7 mm. At the junction of the body and head of the pancreas, there is suggestion a potential ill-defined low-attenuation mass that could measure as much as 3 cm in AP diameter. Borders are very difficult to delineate by CT. There is no associated biliary obstruction. 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 Recommend eventual correlation with MRI of the abdomen with and without gadolinium. There may be a window of time recommended to wait to perform MRI after placement of the gastroduodenal artery Ruby coils. This can be further investigated based on manufacturer recommendation. If there may be a significant delay in ability to perform MRI, consider endoscopic ultrasound to investigate the pancreas. Spleen: Normal in size without focal abnormality. Adrenals/Urinary Tract: Adrenal glands are unremarkable. Kidneys are normal, without renal calculi, focal lesion, or hydronephrosis. Bladder is unremarkable. Stomach/Bowel: Gastric fold thickening and prominent fold thickening of the duodenum may be consistent with known peptic ulcer disease. No evidence of bowel obstruction or free intraperitoneal air. No focal abscess identified. Lymphatic: No enlarged lymph nodes identified. Reproductive: Prostate is unremarkable. Other: Diffuse anasarca of the body wall and edema of the mesenteric and retroperitoneal fat likely relate to low albumin and malnutrition. Musculoskeletal: Moderate degenerative disc disease at L5-S1. IMPRESSION: VASCULAR 1. Occluded gastroduodenal artery after  recent coil embolization. No complications evident after embolization. 2. Stable mild aneurysmal disease of the distal abdominal aorta measuring 3.1 cm. 3. 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 The major change since prior imaging is significant increase in mural thrombus within the dilated common iliac arteries bilaterally, especially on the right. This is most likely the source distal emboli to the right distal leg. Mural thrombus in the internal iliac artery aneurysms is stable to slightly more prominent. 4. Stable diffuse enlargement bilateral external iliac and common femoral arteries. The left common femoral artery does contain significant posterior wall mural thrombus. NON-VASCULAR 1. The most significant nonvascular finding is potential development of a mass of the pancreas at the juncture of the head and body causing progressive atrophy of the tail of the pancreas with associated pancreatic ductal dilatation. On the venous phase of imaging, delineation of the potential mass is quite vague but the region of mass may measure as much as 3 cm. There may be some issue with immediate performance of MRI in the setting recent coiling of the gastroduodenal artery even if the coils are MRI compatible. This can be checked with the manufacturer recommendation. If MRI can be performed immediately, an MRI of the abdomen with and without gadolinium would be helpful for further evaluation. If there is going to be significant delay, consider EUS characterization of the pancreas. 2. Diffuse body wall anasarca and edema of the mesenteric fat and retroperitoneum likely reflects low albumin and malnutrition. 3. Prominent gastric folds and duodenal folds without evidence of bowel obstruction or perforation. Electronically Signed   By: Aletta Edouard M.D.   On: 09/11/2018 12:36     LOS: 9 days   Signature  Derris Millan M.D  on 09/12/2018 at 11:21 AM   -  To page  go to [email or web address] - password [facility]

## 2018-09-12 NOTE — Progress Notes (Signed)
Pharmacy called and asked if Patient had active bleeding. Informed Pharmacy that per night shift patient had black bloody bowel movement over night. Pharmacy to call MD.

## 2018-09-12 NOTE — Progress Notes (Signed)
ANTICOAGULATION CONSULT NOTE - Initial Consult  Pharmacy Consult for Heparin Indication: Arterial embolism  Allergies  Allergen Reactions  . Lisinopril Anaphylaxis    Patient Measurements: Height: 5\' 11"  (180.3 cm) Weight: 147 lb 0.8 oz (66.7 kg) IBW/kg (Calculated) : 75.3 Heparin Dosing Weight: 66.7  Vital Signs: Temp: 98.8 F (37.1 C) (12/22 1603) Temp Source: Oral (12/22 1603) BP: 117/92 (12/22 1603) Pulse Rate: 76 (12/22 1603)  Labs: Recent Labs    09/10/18 0412 09/11/18 0410 09/11/18 1624 09/12/18 0415 09/12/18 0945 09/12/18 1322 09/12/18 1928  HGB 9.1* 10.0*  --  8.6* 9.6* 8.5* 8.7*  HCT 26.7* 29.3*  --  25.9* 28.9* 25.5* 25.8*  PLT 151 208  --  210 241 213 240  APTT  --   --  30 30  --   --   --   HEPARINUNFRC  --   --   --  <0.10*  --   --  <0.10*  CREATININE 0.57* 0.60*  --  0.75  --   --   --     Estimated Creatinine Clearance: 96.1 mL/min (by C-G formula based on SCr of 0.75 mg/dL).   Medical History: Past Medical History:  Diagnosis Date  . Back pain, chronic   . Diabetes ([place])   . Dilated cardiomyopathy ([place])    Echo 2/18: EF 45-50, diff HK, Gr 1 DD, trivial AI/TR  . HLD (hyperlipidemia)   . Hypertension   . IDDM (insulin dependent diabetes mellitus) ([place])   . Ruptured lumbar disc    Assessment: 57 yo male with artial embolus s/p embolectomy. Patient had development of hematoma with compartment syndrome as well as hemorrhagic shock secondary to a bleeding duodenal ulcer.   Heparin level <0.1, Hgb stable at 8.7. However, patient still having bloody stools. Repeat Hgb pending for midnight. D/W MD on call, will hold on increasing heparin until results of midnight H&H are back. If stable, will increase cautiously.   Goal of Therapy:  Heparin level 0.3-0.5 Monitor platelets by anticoagulation protocol: Yes   Plan:  Continue Heparin drip at 600 units/hr (no bolus) Adjust pending H and H at midnight.  Monitor CBC  Silas Muff A. Levada Dy, PharmD,  [place] Pager: [phone] Please utilize Amion for appropriate phone number to reach the unit pharmacist ([place])   09/12/2018,9:02 PM

## 2018-09-12 NOTE — Progress Notes (Signed)
[facility] [place]  Subjective  - Heparin held last night due to dark stool.  Patient states stool color actually improving.  Hgb 8.6.  Objective (!) 130/94 (!) 101 99.1 F (37.3 C) (Oral) 18 98%  Intake/Output Summary (Last 24 hours) at 09/12/2018 0934 Last data filed at 09/12/2018 0512 Gross per 24 hour  Intake 341.09 ml  Output 800 ml  Net -458.91 ml   Right fasciotomies closed Brisk R PT signal biphasic, monophasic DP signal  Laboratory Lab Results: Recent Labs    09/11/18 0410 09/12/18 0415  WBC 11.0* 8.0  HGB 10.0* 8.6*  HCT 29.3* 25.9*  PLT 208 210   BMET Recent Labs    09/11/18 0410 09/12/18 0415  NA 133* 135  K 3.6 3.4*  CL 100 104  CO2 23 23  GLUCOSE 294* 297*  BUN 11 10  CREATININE 0.60* 0.75  CALCIUM 7.0* 7.1*    COAG Lab Results  Component Value Date   INR 1.28 09/07/2018   INR 1.03 09/03/2018   INR 1.0 05/15/2016   No results found for: PTT  Assessment/Planning:  Discussed with Dr. Candiss Norse.  Will recheck H&H this am.  If stable will restart heparin at 400 u/hr - was stopped overnight for dark stool.  Patient feels this is actually improving and not as dark now.  High risk for re-embolization/thrombosis.  Dressing removed today and fasciotomies c/d/i after closure.  Appreciate GI input for anticoagulation in setting of recent GI bleed.  Good PT signal that is biphasic and brisk.    Marty Heck 09/12/2018 9:34 AM --

## 2018-09-12 NOTE — Progress Notes (Signed)
Informed Dr. Candiss Norse that patient had a moderately sized dark stool mixed with red blood. Per Dr. Candiss Norse order and H and H now and at midnight. If H and H comes back less than 8 hold heparin drip and transfuse 1 unit of PRBC.

## 2018-09-13 ENCOUNTER — Encounter (HOSPITAL_COMMUNITY): Payer: Self-pay

## 2018-09-13 DIAGNOSIS — D62 Acute posthemorrhagic anemia: Secondary | ICD-10-CM | POA: Diagnosis not present

## 2018-09-13 DIAGNOSIS — Z720 Tobacco use: Secondary | ICD-10-CM

## 2018-09-13 DIAGNOSIS — K922 Gastrointestinal hemorrhage, unspecified: Secondary | ICD-10-CM | POA: Diagnosis not present

## 2018-09-13 DIAGNOSIS — M62261 Nontraumatic ischemic infarction of muscle, right lower leg: Secondary | ICD-10-CM | POA: Diagnosis present

## 2018-09-13 DIAGNOSIS — I714 Abdominal aortic aneurysm, without rupture: Secondary | ICD-10-CM

## 2018-09-13 DIAGNOSIS — I739 Peripheral vascular disease, unspecified: Secondary | ICD-10-CM

## 2018-09-13 DIAGNOSIS — E1165 Type 2 diabetes mellitus with hyperglycemia: Secondary | ICD-10-CM

## 2018-09-13 DIAGNOSIS — G894 Chronic pain syndrome: Secondary | ICD-10-CM | POA: Diagnosis not present

## 2018-09-13 DIAGNOSIS — F1410 Cocaine abuse, uncomplicated: Secondary | ICD-10-CM | POA: Diagnosis present

## 2018-09-13 LAB — COMPREHENSIVE METABOLIC PANEL
ALT: 19 U/L (ref 0–44)
AST: 26 U/L (ref 15–41)
Albumin: 1.4 g/dL — ABNORMAL LOW (ref 3.5–5.0)
Alkaline Phosphatase: 53 U/L (ref 38–126)
Anion gap: 12 (ref 5–15)
BUN: 6 mg/dL (ref 6–20)
CO2: 26 mmol/L (ref 22–32)
Calcium: 7.4 mg/dL — ABNORMAL LOW (ref 8.9–10.3)
Chloride: 100 mmol/L (ref 98–111)
Creatinine, Ser: 0.49 mg/dL — ABNORMAL LOW (ref 0.61–1.24)
GFR calc non Af Amer: >60 mL/min (ref >60)
Glucose, Bld: 254 mg/dL — ABNORMAL HIGH (ref 70–99)
Potassium: 3.4 mmol/L — ABNORMAL LOW (ref 3.5–5.1)
Sodium: 138 mmol/L (ref 135–145)
Total Bilirubin: 0.6 mg/dL (ref 0.3–1.2)
Total Protein: 4.2 g/dL — ABNORMAL LOW (ref 6.5–8.1)

## 2018-09-13 LAB — CBC
HCT: 24.9 % — ABNORMAL LOW (ref 39.0–52.0)
Hemoglobin: 8.5 g/dL — ABNORMAL LOW (ref 13.0–17.0)
MCH: 28.8 pg (ref 26.0–34.0)
MCHC: 34.1 g/dL (ref 30.0–36.0)
MCV: 84.4 fL (ref 80.0–100.0)
Platelets: 246 10*3/uL (ref 150–400)
RBC: 2.95 MIL/uL — ABNORMAL LOW (ref 4.22–5.81)
RDW: 14 % (ref 11.5–15.5)
WBC: 7.9 10*3/uL (ref 4.0–10.5)
nRBC: 0 % (ref 0.0–0.2)

## 2018-09-13 LAB — GLUCOSE, CAPILLARY
Glucose-Capillary: 226 mg/dL — ABNORMAL HIGH (ref 70–99)
Glucose-Capillary: 214 mg/dL — ABNORMAL HIGH (ref 70–99)
Glucose-Capillary: 218 mg/dL — ABNORMAL HIGH (ref 70–99)
Glucose-Capillary: 227 mg/dL — ABNORMAL HIGH (ref 70–99)

## 2018-09-13 LAB — MAGNESIUM: Magnesium: 1.7 mg/dL (ref 1.7–2.4)

## 2018-09-13 LAB — HEPARIN LEVEL (UNFRACTIONATED)
Heparin Unfractionated: <0.1 IU/mL — ABNORMAL LOW (ref 0.30–0.70)
Heparin Unfractionated: 0.11 IU/mL — ABNORMAL LOW (ref 0.30–0.70)

## 2018-09-13 LAB — CANCER ANTIGEN 19-9: CAN 19-9: 61 U/mL — AB (ref 0–35)

## 2018-09-13 MED ORDER — LIDOCAINE VISCOUS HCL 2 % MT SOLN: 15.0000 mL | Freq: Once | OROMUCOSAL | Status: DC

## 2018-09-13 MED ORDER — MAGNESIUM SULFATE IN D5W 1-5 GM/100ML-% IV SOLN
1.0000 g | Freq: Once | INTRAVENOUS | Status: AC
  Administered 2018-09-13: 1 g via INTRAVENOUS
  Filled 2018-09-13: qty 100

## 2018-09-13 MED ORDER — ALUM & MAG HYDROXIDE-SIMETH 200-200-20 MG/5ML PO SUSP
30.0000 mL | Freq: Once | ORAL | Status: AC
  Administered 2018-09-13: 30 mL via ORAL
  Filled 2018-09-13: qty 30

## 2018-09-13 MED ORDER — POTASSIUM CHLORIDE CRYS ER 20 MEQ PO TBCR
40.0000 meq | EXTENDED_RELEASE_TABLET | Freq: Once | ORAL | Status: AC
  Administered 2018-09-13: 40 meq via ORAL
  Filled 2018-09-13: qty 2

## 2018-09-13 NOTE — NC FL2 (Signed)
[place] MEDICAID FL2 LEVEL OF CARE SCREENING TOOL     IDENTIFICATION  Patient Name: Ryan Medina Birthdate: Mar 29, 1961 Sex: male Admission Date (Current Location): 09/03/2018  [facility] and [place] Number:  Herbalist and Address:  [facility] [place], [place] [address], [place], [place] [address]      Provider Number: [id number]  Attending Physician Name and Address:  Thurnell Lose, MD  Relative Name and Phone Number:  [name], sister [phone]    Current Level of Care: Hospital Recommended Level of Care: [place] Prior Approval Number:    Date Approved/Denied:   PASRR Number: [id number]  Discharge Plan: SNF    Current Diagnoses: Patient Active Problem List   Diagnosis Date Noted  . Hemorrhagic shock ([place])   . 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 Ischemic foot 09/03/2018  . Right leg claudication ([place]) 09/03/2018  . PVD (peripheral vascular disease) ([place]) 09/03/2018  . HLD (hyperlipidemia) 08/22/2018  . DKA (diabetic ketoacidoses) ([place]) 08/22/2018  . AKI (acute kidney injury) ([place]) 08/22/2018  . Abdominal pain 08/22/2018  . Hyperosmolar non-ketotic state in patient with type 2 diabetes mellitus ([place]) 01/30/2018  . Chest pain 01/30/2018  . AAA (abdominal aortic aneurysm) ([place]) 01/30/2018  . Tobacco abuse 01/30/2018  . Nausea & vomiting 01/30/2018  . Diabetes mellitus due to underlying condition with hyperosmolarity without nonketotic hyperglycemic-hyperosmolar coma [facility]) ([place]) 01/30/2018  . Dilated cardiomyopathy ([place])   . Other hyperlipidemia 11/11/2016  . Abnormal nuclear stress test 05/19/2016  . Atypical angina ([place]) 04/25/2016  . DM (diabetes mellitus), type 2, uncontrolled ([place]) 03/14/2015  . Essential hypertension 03/14/2015  . Hyperglycemia 01/14/2015    Orientation RESPIRATION BLADDER Height & Weight     Self, Time, Situation, Place  Normal Continent, Indwelling catheter Weight: 66.7  kg Height:  5' 11"  (180.3 cm)  BEHAVIORAL SYMPTOMS/MOOD NEUROLOGICAL BOWEL NUTRITION STATUS      Incontinent Diet(Please see DC Summary)  AMBULATORY STATUS COMMUNICATION OF NEEDS Skin   Limited Assist Verbally Surgical wounds, Wound Vac(Closed incision on leg, thigh, groin;negative pressure wound on pretibial;)                       Personal Care Assistance Level of Assistance  Bathing, Feeding, Dressing Bathing Assistance: Maximum assistance Feeding assistance: Limited assistance Dressing Assistance: Limited assistance     Functional Limitations Info  Sight, Hearing, Speech Sight Info: Adequate Hearing Info: Adequate Speech Info: Adequate    SPECIAL CARE FACTORS FREQUENCY  PT (By licensed PT), OT (By licensed OT)     PT Frequency: 3x/week OT Frequency: 3x/week            Contractures Contractures Info: Not present    Additional Factors Info  Code Status, Allergies, Insulin Sliding Scale Code Status Info: Partial Allergies Info: Lisinopril   Insulin Sliding Scale Info: 3x daily with meals        Current Medications (09/13/2018):  This is the current hospital active medication list Current Facility-Administered Medications  Medication Dose Route Frequency Provider Last Rate Last Dose  . 0.9 %  sodium chloride infusion  500 mL Intravenous Once PRN Rhyne, Samantha J, PA-C      . atorvastatin (LIPITOR) tablet 20 mg  20 mg Oral Daily Rhyne, Samantha J, PA-C   20 mg at 09/13/18 0819  . chlorhexidine gluconate (MEDLINE KIT) (PERIDEX) 0.12 % solution 15 mL  15 mL Mouth Rinse BID Rhyne, Samantha J, PA-C   15 mL at  09/12/18 2005  . heparin ADULT infusion 100 units/mL (25000 units/289m sodium chloride 0.45%)  750 Units/hr Intravenous Continuous LErenest Blank RPH 7.5 mL/hr at 09/13/18 0037 750 Units/hr at 09/13/18 0037  . insulin aspart (novoLOG) injection 0-9 Units  0-9 Units Subcutaneous TID WC Rhyne, Samantha J, PA-C   3 Units at 09/13/18 00289 .  ipratropium-albuterol (DUONEB) 0.5-2.5 (3) MG/3ML nebulizer solution 3 mL  3 mL Nebulization Q4H PRN Rhyne, Samantha J, PA-C   3 mL at 09/11/18 2124  . LORazepam (ATIVAN) tablet 0.5-1 mg  0.5-1 mg Oral Q6H PRN Rhyne, Samantha J, PA-C   1 mg at 09/12/18 1800  . magnesium sulfate IVPB 1 g 100 mL  1 g Intravenous Once SThurnell Lose MD      . MEDLINE mouth rinse  15 mL Mouth Rinse BID RGabriel Earing PA-C   15 mL at 09/12/18 2139  . ondansetron (ZOFRAN) tablet 4 mg  4 mg Oral Q6H PRN Rhyne, Samantha J, PA-C       Or  . ondansetron (ZOFRAN) injection 4 mg  4 mg Intravenous Q6H PRN Rhyne, Samantha J, PA-C   4 mg at 09/05/18 2257  . oxyCODONE-acetaminophen (PERCOCET) 7.5-325 MG per tablet 1 tablet  1 tablet Oral Q6H PRN RGabriel Earing PA-C   1 tablet at 09/13/18 0910  . pantoprazole (PROTONIX) EC tablet 40 mg  40 mg Oral BID RGabriel Earing PA-C   40 mg at 09/13/18 00228 . polyethylene glycol (MIRALAX / GLYCOLAX) packet 17 g  17 g Oral Daily PRN Rhyne, Samantha J, PA-C      . senna-docusate (Senokot-S) tablet 1 tablet  1 tablet Oral QHS PRN Rhyne, Samantha J, PA-C      . sodium chloride flush (NS) 0.9 % injection 10-40 mL  10-40 mL Intracatheter PRN Rhyne, Samantha J, PA-C   10 mL at 09/11/18 0418  . tamsulosin (FLOMAX) capsule 0.4 mg  0.4 mg Oral Daily SThurnell Lose MD   0.4 mg at 09/13/18 04069    Discharge Medications: Please see discharge summary for a list of discharge medications.  Relevant Imaging Results:  Relevant Lab Results:   Additional Information SSN: [id number]  [place] City[place]

## 2018-09-13 NOTE — Progress Notes (Signed)
[facility] [place]  Subjective  - No acute events.  Objective (!) 137/93 87 98.6 F (37 C) (Oral) 18 100%  Intake/Output Summary (Last 24 hours) at 09/13/2018 1041 Last data filed at 09/13/2018 0414 Gross per 24 hour  Intake 98.43 ml  Output 1302 ml  Net -1203.57 ml   Right fasciotomies closed - incisions c/d/i Brisk R PT signal biphasic, monophasic DP signal  Laboratory Lab Results: Recent Labs    09/12/18 1928 09/12/18 2318 09/13/18 0352  WBC 7.5  --  7.9  HGB 8.7* 8.7* 8.5*  HCT 25.8* 26.2* 24.9*  PLT 240  --  246   BMET Recent Labs    09/12/18 0415 09/13/18 0352  NA 135 138  K 3.4* 3.4*  CL 104 100  CO2 23 26  GLUCOSE 297* 254*  BUN 10 6  CREATININE 0.75 0.49*  CALCIUM 7.1* 7.4*    COAG Lab Results  Component Value Date   INR 1.28 09/07/2018   INR 1.03 09/03/2018   INR 1.0 05/15/2016   No results found for: PTT  Assessment/Planning:  Hgb 8.7 --> 8.5, seems to be tolerating heparin at fixed rate of 600 units/hr.  Doppler signals stable and foot well perfused.  High risk for re-embolization/thrombosis.  Will need to make a decision about anticoagulation for discharge pending Hgb again tomorrow.    Marty Heck 09/13/2018 10:41 AM --

## 2018-09-13 NOTE — Progress Notes (Signed)
[place] for Heparin Indication: Arterial embolism  Allergies  Allergen Reactions  . Lisinopril Anaphylaxis    Patient Measurements: Height: 5\' 11"  (180.3 cm) Weight: 147 lb 0.8 oz (66.7 kg) IBW/kg (Calculated) : 75.3 Heparin Dosing Weight: 66.7  Vital Signs: Temp: 98.8 F (37.1 C) (12/23 1450) Temp Source: Oral (12/23 1450) BP: 130/90 (12/23 1450) Pulse Rate: 82 (12/23 1450)  Labs: Recent Labs    09/11/18 0410 09/11/18 1624  09/12/18 0415  09/12/18 1322 09/12/18 1928 09/12/18 2318 09/13/18 0352 09/13/18 0918 09/13/18 1830  HGB 10.0*  --   --  8.6*   < > 8.5* 8.7* 8.7* 8.5*  --   --   HCT 29.3*  --   --  25.9*   < > 25.5* 25.8* 26.2* 24.9*  --   --   PLT 208  --   --  210   < > 213 240  --  246  --   --   APTT  --  30  --  30  --   --   --   --   --   --   --   HEPARINUNFRC  --   --    < > <0.10*  --   --  <0.10*  --   --  <0.10* 0.11*  CREATININE 0.60*  --   --  0.75  --   --   --   --  0.49*  --   --    < > = values in this interval not displayed.    Estimated Creatinine Clearance: 96.1 mL/min (A) (by C-G formula based on SCr of 0.49 mg/dL (L)).   Assessment: 57 yo male with artial embolus s/p embolectomy. Patient had development of hematoma with compartment syndrome as well as hemorrhagic shock secondary to a bleeding duodenal ulcer. Plan is for EUS with FNA on 12/26 to assess pancreatic mass.  Heparin level is 0.11 tonight on 900 units/hr. Subtherapeutic, but up from undetectable on 750 units/hr this am.  RN reports no bleeding, but patient reports continued blood in stool. On PPI PO BID. High risk for re-embolization/thrombosis.  Dr Carlis Abbott ok with cautious heparin rate increase as long as patient able to tolerate.    Goal of Therapy:  Heparin level 0.3-0.5 units/mL Monitor platelets by anticoagulation protocol: Yes   Plan:   Increase heparin drip to 1050 units/hr  Heparin level ~6 hrs after increase  Daily heparin  level and CBC  Close monitoring of Hgb/ melena.  Heparin drip scheduled to stop on 12/26 at 07:00 per discussion with GI  Arty Baumgartner, [place] Pager: [phone] or phone: [phone] 09/13/2018 8:03 PM

## 2018-09-13 NOTE — Consult Note (Signed)
Physical Medicine and Rehabilitation Consult Reason for Consult:  Decreased functional mobility Referring Physician: [facility]   HPI: Ryan Medina is a 57 y.o.right handed male with history of chronic back pain, diabetes mellitus, abdominal aortic aneurysm,cardiomyopathy with diastolic congestive heart failure, hypertension, tobacco/cocaine abuse.  History taken from chart review and patient.  Patient with recent discharge for DKA. Per chart review patient lives with fianc. Independent with a cane prior to admission. One level home with 6 steps to entry. [employer] works during the day. Presented 09/03/2018 with right lower extremity pain with ischemic changes. Imaging and vascular workup showed acute popliteal and tibial embolus and underwent embolectomy 09/03/2018 per Dr. Scot Dock. Placed on IV heparin. He developed acute right lower extremity hematoma with compartment syndrome requiring fasciotomy 09/05/2018. Hospital course complicated by hemorrhagic shock with acute blood loss anemia secondary to bleeding to duodenal ulcer requiring ICU transfer intubation for airway protection and interventional radiology evaluation with mesenteric angiogram and GDA embolization. Patient currently remains on IV heparin. Therapy evaluations completed with recommendations of physical medicine rehabilitation consult.  Review of Systems  Constitutional: Negative for chills and fever.  HENT: Negative for hearing loss.   Eyes: Negative for blurred vision and double vision.  Respiratory: Negative for cough and shortness of breath.   Cardiovascular: Positive for leg swelling. Negative for chest pain and palpitations.  Gastrointestinal: Positive for constipation. Negative for nausea and vomiting.  Genitourinary: Positive for urgency. Negative for dysuria, flank pain and hematuria.  Musculoskeletal: Positive for back pain, joint pain and myalgias.       Right lower extremity leg pain  Skin: Negative for rash.    Neurological: Positive for focal weakness. Negative for sensory change.  All other systems reviewed and are negative.  Past Medical History:  Diagnosis Date  . Back pain, chronic   . Diabetes ([place])   . Dilated cardiomyopathy ([place])    Echo 2/18: EF 45-50, diff HK, Gr 1 DD, trivial AI/TR  . HLD (hyperlipidemia)   . Hypertension   . IDDM (insulin dependent diabetes mellitus) ([place])   . Ruptured lumbar disc    Past Surgical History:  Procedure Laterality Date  . ABDOMINAL AORTOGRAM W/LOWER EXTREMITY Right 09/03/2018   Procedure: ABDOMINAL AORTOGRAM W/LOWER EXTREMITY;  Surgeon: Angelia Mould, MD;  Location: [facility];  Service: Cardiovascular;  Laterality: Right;  . ABDOMINAL SURGERY    . APPENDECTOMY    . APPLICATION OF WOUND VAC Right 09/05/2018   Procedure: APPLICATION OF WOUND VAC RIGHT LOWER MEDIAL AND LATERAL FASCIOTOMY SITE;  Surgeon: Angelia Mould, MD;  Location: [facility];  Service: Vascular;  Laterality: Right;  . BACK SURGERY    . CARDIAC CATHETERIZATION N/A 05/19/2016   Procedure: Left Heart Cath and Coronary Angiography;  Surgeon: Leonie Man, MD;  Location: [facility];  Service: Cardiovascular;  Laterality: N/A;  . EMBOLECTOMY Right 09/03/2018   Procedure: Embolectomy Right Popliteal and Tibial Artery, Bovine Pericardium Patch Angioplasty Right Popliteal Artery;  Surgeon: Angelia Mould, MD;  Location: [facility];  Service: Vascular;  Laterality: Right;  . EMBOLECTOMY Right 09/10/2018   Procedure: EMBOLECTOMY RIGHT LOWER EXTREMITY;  Surgeon: Angelia Mould, MD;  Location: [facility];  Service: Vascular;  Laterality: Right;  . ESOPHAGOGASTRODUODENOSCOPY N/A 09/07/2018   Procedure: ESOPHAGOGASTRODUODENOSCOPY (EGD);  Surgeon: Irving Copas., MD;  Location: [facility];  Service: Gastroenterology;  Laterality: N/A;  . FASCIOTOMY Right 09/05/2018   Procedure: FOUR COMPARTMENT FASCIOTOMY RIGHT LOWER LEG ;  Surgeon: Angelia Mould, MD;  Location: [facility] OR;  Service: Vascular;  Laterality: Right;  . FASCIOTOMY CLOSURE Right 09/10/2018   Procedure: FASCIOTOMY CLOSURE FOUR COMPARTMENT;  Surgeon: Angelia Mould, MD;  Location: [facility];  Service: Vascular;  Laterality: Right;  . HEMATOMA EVACUATION Right 09/05/2018   Procedure: EVACUATION HEMATOMA OF RIGHT LOWER EXTREMITY; EVACUATION OF LYMPHOCELE RIGHT LOWER LEG ;  Surgeon: Angelia Mould, MD;  Location: [facility];  Service: Vascular;  Laterality: Right;  . IR ANGIOGRAM SELECTIVE EACH ADDITIONAL VESSEL  09/07/2018  . IR ANGIOGRAM SELECTIVE EACH ADDITIONAL VESSEL  09/07/2018  . IR ANGIOGRAM VISCERAL SELECTIVE  09/07/2018  . IR ANGIOGRAM VISCERAL SELECTIVE  09/07/2018  . IR EMBO ART  VEN HEMORR LYMPH EXTRAV  INC GUIDE ROADMAPPING  09/07/2018  . IR US GUIDE VASC ACCESS RIGHT  09/07/2018   Family History  Problem Relation Age of Onset  . Diabetes Mother   . Heart failure Mother   . Diabetes Sister   . Diabetes Brother    Social History:  reports that he has been smoking cigarettes. He has a 6.25 pack-year smoking history. He has never used smokeless tobacco. He reports that he does not drink alcohol or use drugs. Allergies:  Allergies  Allergen Reactions  . Lisinopril Anaphylaxis   Medications Prior to Admission  Medication Sig Dispense Refill  . amLODipine (NORVASC) 10 MG tablet Take 1 tablet (10 mg total) by mouth daily. 30 tablet 0  . atorvastatin (LIPITOR) 20 MG tablet Take 1 tablet (20 mg total) by mouth daily. 30 tablet 0  . cyclobenzaprine (FLEXERIL) 10 MG tablet Take 1 tablet (10 mg total) by mouth 2 (two) times daily as needed for muscle spasms. 20 tablet 0  . gabapentin (NEURONTIN) 300 MG capsule Take 1 capsule (300 mg total) by mouth 3 (three) times daily. 90 capsule 0  . insulin aspart (NOVOLOG) 100 UNIT/ML injection Inject 0-15 Units into the skin 3 (three) times daily with meals. 0-15 Units, Subcutaneous, 3 times daily with meals CBG  < 70: implement hypoglycemia protocol CBG 70 - 120: 0 units CBG 121 - 150: 2 units CBG 151 - 200: 3 units CBG 201 - 250: 5 units CBG 251 - 300: 8 units CBG 301 - 350: 11 units CBG 351 - 400: 15 units CBG > 400: call MD 20 mL 0  . insulin detemir (LEVEMIR) 100 UNIT/ML injection Inject 0.3 mLs (30 Units total) into the skin 2 (two) times daily. (Patient taking differently: Inject 20-30 Units into the skin See admin instructions. Take 30 units in the morning, and 20 units in the evening) 20 mL 0  . metFORMIN (GLUCOPHAGE) 1000 MG tablet Take 1 tablet (1,000 mg total) by mouth 2 (two) times daily with a meal. 60 tablet 0  . nitroGLYCERIN (NITROSTAT) 0.4 MG SL tablet Place 1 tablet (0.4 mg total) under the tongue every 5 (five) minutes as needed for chest pain. 30 tablet 0  . dicyclomine (BENTYL) 20 MG tablet Take 1 tablet (20 mg total) by mouth 2 (two) times daily. (Patient not taking: Reported on 09/05/2018) 20 tablet 0  . glucose blood (AGAMATRIX PRESTO TEST) test strip Check blood sugars prior to meals and at bedtime 100 each 0    Home: Home Living Family/patient expects to be discharged to:: Private residence Living Arrangements: Spouse/significant other Available Help at Discharge: Family Type of Home: House Home Access: Stairs to enter [employer] of Steps: 4 and then 4 Home Layout: One level  Bathroom Shower/Tub: Chiropodist: Standard Home Equipment: Cane - single point, Crutches  Functional History: Prior Function Level of Independence: Independent with assistive device(s) Comments: ADLs, IADLs, use of SPC for functional mobility Functional Status:  Mobility: Bed Mobility Overal bed mobility: Needs Assistance Bed Mobility: Supine to Sit, Sit to Supine Supine to sit: Min guard Sit to supine: Min guard General bed mobility comments: increased time and effort, min guard for safety Transfers Overall transfer level: Needs assistance Equipment  used: Rolling walker (2 wheeled) Transfers: Sit to/from Stand, Stand Pivot Transfers Sit to Stand: From elevated surface, Mod assist, +2 physical assistance Stand pivot transfers: Mod assist, +2 physical assistance General transfer comment: increased time and effort, cueing for technique and safe hand placement, assist to power into standing and for pivot, assist needed for RW management as well; pt performed sit<>stand x1 from EOB and x1 from recliner chair, performed pivot x2      ADL: ADL Overall ADL's : Needs assistance/impaired Eating/Feeding: Independent, Sitting Grooming: Set up, Supervision/safety, Sitting Upper Body Bathing: Minimal assistance, Sitting Lower Body Bathing: Moderate assistance, +2 for physical assistance, Sit to/from stand Upper Body Dressing : Minimal assistance, Sitting Upper Body Dressing Details (indicate cue type and reason): donned second gown like a jacket Lower Body Dressing: Moderate assistance, +2 for physical assistance, Sit to/from stand, Minimal assistance Lower Body Dressing Details (indicate cue type and reason): Min A to bring right ankle to left knee. Pt requiring increased effort and time for donning right sock. Pt able to bring left ankle to right knee. Mod A +2 for sit<>stand ' Toilet Transfer: Moderate assistance, +2 for physical assistance, Stand-pivot Toilet Transfer Details (indicate cue type and reason): Mod A +2 for power up into standing and then maintain balance duirng pivot to recliner Functional mobility during ADLs: Moderate assistance, +2 for physical assistance General ADL Comments: Pt with decreased balance and activity tolerance. Continues to report increased pain at RLE  Cognition: Cognition Overall Cognitive Status: Within Functional Limits for tasks assessed Orientation Level: Oriented X4 Cognition Arousal/Alertness: Awake/alert Behavior During Therapy: WFL for tasks assessed/performed Overall Cognitive Status: Within  Functional Limits for tasks assessed  Blood pressure (!) 137/93, pulse 87, temperature 98.6 F (37 C), temperature source Oral, resp. rate 18, height 5\' 11"  (1.803 m), weight 66.7 kg, SpO2 100 %. Physical Exam  Vitals reviewed. Constitutional: He is oriented to person, place, and time. He appears well-developed and well-nourished.  HENT:  Head: Normocephalic and atraumatic.  Eyes: EOM are normal. Right eye exhibits no discharge. Left eye exhibits no discharge.  Neck: Normal range of motion. Neck supple. No thyromegaly present.  Cardiovascular: Normal rate and regular rhythm.  Respiratory: Effort normal and breath sounds normal. No respiratory distress.  GI: Soft. Bowel sounds are normal. He exhibits no distension.  Musculoskeletal:     Comments: Right lower extremity edema and tenderness  Neurological: He is alert and oriented to person, place, and time.  Motor: Bilateral upper extremities: 5/5 proximal distal Left lower extremity: Hip flexion extension 4+/5, ankle dorsiflexion 5/5 Right lower extremity: Hip flexion, knee extension 4-/5, ankle dorsiflexion 4+/5 (some pain inhibition)  Skin: Skin is warm and dry.  Psychiatric: He has a normal mood and affect. His behavior is normal.    Results for orders placed or performed during the hospital encounter of 09/03/18 (from the past 24 hour(s))  CBC     Status: Abnormal   Collection Time: 09/12/18  9:45 AM  Result Value Ref Range  WBC 8.1 4.0 - 10.5 K/uL   RBC 3.40 (L) 4.22 - 5.81 MIL/uL   Hemoglobin 9.6 (L) 13.0 - 17.0 g/dL   HCT 28.9 (L) 39.0 - 52.0 %   MCV 85.0 80.0 - 100.0 fL   MCH 28.2 26.0 - 34.0 pg   MCHC 33.2 30.0 - 36.0 g/dL   RDW 14.2 11.5 - 15.5 %   Platelets 241 150 - 400 K/uL   nRBC 0.0 0.0 - 0.2 %  Glucose, capillary     Status: Abnormal   Collection Time: 09/12/18 12:05 PM  Result Value Ref Range   Glucose-Capillary 309 (H) 70 - 99 mg/dL  CBC     Status: Abnormal   Collection Time: 09/12/18  1:22 PM  Result  Value Ref Range   WBC 7.2 4.0 - 10.5 K/uL   RBC 3.09 (L) 4.22 - 5.81 MIL/uL   Hemoglobin 8.5 (L) 13.0 - 17.0 g/dL   HCT 25.5 (L) 39.0 - 52.0 %   MCV 82.5 80.0 - 100.0 fL   MCH 27.5 26.0 - 34.0 pg   MCHC 33.3 30.0 - 36.0 g/dL   RDW 13.9 11.5 - 15.5 %   Platelets 213 150 - 400 K/uL   nRBC 0.0 0.0 - 0.2 %  Glucose, capillary     Status: Abnormal   Collection Time: 09/12/18  5:00 PM  Result Value Ref Range   Glucose-Capillary 293 (H) 70 - 99 mg/dL  Heparin level (unfractionated)     Status: Abnormal   Collection Time: 09/12/18  7:28 PM  Result Value Ref Range   Heparin Unfractionated <0.10 (L) 0.30 - 0.70 IU/mL  CBC     Status: Abnormal   Collection Time: 09/12/18  7:28 PM  Result Value Ref Range   WBC 7.5 4.0 - 10.5 K/uL   RBC 3.05 (L) 4.22 - 5.81 MIL/uL   Hemoglobin 8.7 (L) 13.0 - 17.0 g/dL   HCT 25.8 (L) 39.0 - 52.0 %   MCV 84.6 80.0 - 100.0 fL   MCH 28.5 26.0 - 34.0 pg   MCHC 33.7 30.0 - 36.0 g/dL   RDW 14.1 11.5 - 15.5 %   Platelets 240 150 - 400 K/uL   nRBC 0.0 0.0 - 0.2 %  Glucose, capillary     Status: Abnormal   Collection Time: 09/12/18  9:41 PM  Result Value Ref Range   Glucose-Capillary 127 (H) 70 - 99 mg/dL  Hemoglobin and hematocrit, blood     Status: Abnormal   Collection Time: 09/12/18 11:18 PM  Result Value Ref Range   Hemoglobin 8.7 (L) 13.0 - 17.0 g/dL   HCT 26.2 (L) 39.0 - 52.0 %  CBC     Status: Abnormal   Collection Time: 09/13/18  3:52 AM  Result Value Ref Range   WBC 7.9 4.0 - 10.5 K/uL   RBC 2.95 (L) 4.22 - 5.81 MIL/uL   Hemoglobin 8.5 (L) 13.0 - 17.0 g/dL   HCT 24.9 (L) 39.0 - 52.0 %   MCV 84.4 80.0 - 100.0 fL   MCH 28.8 26.0 - 34.0 pg   MCHC 34.1 30.0 - 36.0 g/dL   RDW 14.0 11.5 - 15.5 %   Platelets 246 150 - 400 K/uL   nRBC 0.0 0.0 - 0.2 %  Comprehensive metabolic panel     Status: Abnormal   Collection Time: 09/13/18  3:52 AM  Result Value Ref Range   Sodium 138 135 - 145 mmol/L   Potassium 3.4 (L) 3.5 - 5.1 mmol/L  Chloride 100 98  - 111 mmol/L   CO2 26 22 - 32 mmol/L   Glucose, Bld 254 (H) 70 - 99 mg/dL   BUN 6 6 - 20 mg/dL   Creatinine, Ser 0.49 (L) 0.61 - 1.24 mg/dL   Calcium 7.4 (L) 8.9 - 10.3 mg/dL   Total Protein 4.2 (L) 6.5 - 8.1 g/dL   Albumin 1.4 (L) 3.5 - 5.0 g/dL   AST 26 15 - 41 U/L   ALT 19 0 - 44 U/L   Alkaline Phosphatase 53 38 - 126 U/L   Total Bilirubin 0.6 0.3 - 1.2 mg/dL   GFR calc non Af Amer >60 >60 mL/min   GFR calc Af Amer >60 >60 mL/min   Anion gap 12 5 - 15  Magnesium     Status: None   Collection Time: 09/13/18  3:52 AM  Result Value Ref Range   Magnesium 1.7 1.7 - 2.4 mg/dL   Mr Abdomen W Wo Contrast  Result Date: 09/12/2018 CLINICAL DATA:  Suspicion of pancreatic neck mass on CT. EXAM: MRI ABDOMEN WITHOUT AND WITH CONTRAST TECHNIQUE: Multiplanar multisequence MR imaging of the abdomen was performed both before and after the administration of intravenous contrast. CONTRAST:  6 cc Gadavist COMPARISON:  CT of 1 day prior. Stone study of 08/22/2018. More remote CT of 12/19/2017. FINDINGS: Moderatemultifactorial degradation, including respiratory motion and susceptibility artifact from embolization coils. Lower chest: Bilateral pleural fluid with bibasilar airspace disease. Normal heart size. Hepatobiliary: Left hepatic lobe 9 mm cyst. Too small to characterizea right hepatic lobe lesion is most likely a cyst at 2 mm. Normal gallbladder, without biliary ductal dilatation. Pancreas: An area of soft tissue fullness and restricted diffusion is identified within the pancreatic neck, corresponding to the abnormality on CT. This measures on the order of 3.9 x 3.6 cm on image 76/12. Also 3.6 x 3.6 cm on image 35/31. Results in pancreatic duct dilatation within the upstream body and tail on image 17/11. Involves the celiac and its branches including on image 75/12. Spleen:  Grossly within normal limits. Adrenals/Urinary Tract: Normal adrenal glands. Grossly normal kidneys, without hydronephrosis.  Stomach/Bowel: Gastric distension with proximal wall and fold thickening, including on image 73/12. Grossly normal abdominal bowel loops. Vascular/Lymphatic: Normal caliber of the aorta. 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 Limited evaluation for abdominal adenopathy. Other:  Small volume abdominal ascites. Musculoskeletal: No acute osseous abnormality. IMPRESSION: 1. Moderate motion and susceptibility artifact degradation throughout. 2. Confirmation of a pancreatic neck mass, most consistent with adenocarcinoma. Involvement of the celiac and its branches, most consistent with a non-operative lesion. 3. Consider endoscopic ultrasound sampling. 4. Gastric wall and fold thickening, suggesting gastritis. 5. Small bilateral pleural effusions with adjacent airspace disease. Electronically Signed   By: Abigail Miyamoto M.D.   On: 09/12/2018 03:04   Ct Angio Abd/pel W/ And/or W/o  Result Date: 09/11/2018 CLINICAL DATA:  Status postoperative right popliteal and tibial embolectomy for embolic disease. Known bilateral iliac artery aneurysmal disease. EXAM: CT ANGIOGRAPHY ABDOMEN AND PELVIS WITH CONTRAST TECHNIQUE: Multidetector CT imaging of the abdomen and pelvis was performed using the standard protocol during bolus administration of intravenous contrast. Multiplanar reconstructed images and MIPs were obtained and reviewed to evaluate the vascular anatomy. CONTRAST:  125mL ISOVUE-370 IOPAMIDOL (ISOVUE-370) INJECTION 76% COMPARISON:  CTA of the abdomen on 01/30/2018 and CT of the abdomen and pelvis with contrast on 12/19/2017. FINDINGS: VASCULAR Aorta: Stable mild dilatation of the infrarenal aorta with maximal diameter of 3.1 cm. Celiac:  Mild origin stenosis of 25-30%. Distal branches are patent. Status post transcatheter coil embolization of the gastroduodenal artery on 09/07/2018. Hepatic arteries are normally patent. SMA: Normally patent. Renals: Bilateral single renal arteries demonstrate normal  patency. IMA: Normally patent. Inflow: Fusiform aneurysmal disease present of bilateral common iliac arteries as previously demonstrated. Maximal caliber of the right common iliac artery is 2.8-2.9 cm and maximal caliber of the right common iliac artery is 2.7 cm. These measurements are stable since prior studies. The significant change since the prior studies is a much more prominent and new component of mural thrombus in both common iliac arteries, right greater than left. In the proximal right common iliac artery, mural thrombus narrows the aneurysmal lumen by at least 50% in its narrowest portion. Eccentric mural thrombus is present at the origin of the right common iliac artery and also distally just prior to the common iliac bifurcation without significant luminal stenosis. Aneurysmal disease again noted of bilateral internal iliac arteries with the left measuring approximately 2.5 cm in greatest diameter and the right measuring 1.5 cm. Both internal iliac arteries also demonstrate significant mural thrombus without occlusion. Mural thrombus is stable to slightly more prominent compared to the 12/19/2017 study. Bilateral external iliac arteries are normally patent and demonstrate diffuse arteriomegaly with maximum caliber 1.5 cm. Proximal Outflow: Bilateral common femoral arteries demonstrate dilatation with the left measuring up to 2.1 cm in diameter. Mural thrombus is present along the posterior lumen with approximately 50% reduction in luminal diameter. No thrombus is seen extending into proximal SFA or profunda femoral arteries. On the right side the common femoral artery measures up to 1.8 cm in the femoral bifurcation appears widely patent. Veins: Delayed venous phase imaging shows patent venous structures without evidence of thrombus. There is some probable mass effect of ectatic external iliac arteries on the distal aspects of bilateral external iliac veins without evidence of associated deep venous  thrombosis or significant collateral vein formation. Review of the MIP images confirms the above findings. NON-VASCULAR Lower chest: Small bilateral pleural effusions with bibasilar atelectasis. Hepatobiliary: No focal liver abnormality is seen. No gallstones, gallbladder wall thickening, or biliary dilatation. Pancreas: Since the March study, there is a change in appearance of the pancreas on the venous phase of imaging. The tail of the pancreas now appears progressively atrophic with associated pancreatic ductal dilatation up to approximately 7 mm. At the junction of the body and head of the pancreas, there is suggestion a potential ill-defined low-attenuation mass that could measure as much as 3 cm in AP diameter. Borders are very difficult to delineate by CT. There is no associated biliary obstruction. 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 Recommend eventual correlation with MRI of the abdomen with and without gadolinium. There may be a window of time recommended to wait to perform MRI after placement of the gastroduodenal artery Ruby coils. This can be further investigated based on manufacturer recommendation. If there may be a significant delay in ability to perform MRI, consider endoscopic ultrasound to investigate the pancreas. Spleen: Normal in size without focal abnormality. Adrenals/Urinary Tract: Adrenal glands are unremarkable. Kidneys are normal, without renal calculi, focal lesion, or hydronephrosis. Bladder is unremarkable. Stomach/Bowel: Gastric fold thickening and prominent fold thickening of the duodenum may be consistent with known peptic ulcer disease. No evidence of bowel obstruction or free intraperitoneal air. No focal abscess identified. Lymphatic: No enlarged lymph nodes identified. Reproductive: Prostate is unremarkable. Other: Diffuse anasarca of the body wall and edema of the mesenteric and  retroperitoneal fat likely relate to low albumin and malnutrition. Musculoskeletal:  Moderate degenerative disc disease at L5-S1. IMPRESSION: VASCULAR 1. 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 No complications evident after embolization. 2. Stable mild aneurysmal disease of the distal abdominal aorta measuring 3.1 cm. 3. 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 The major change since prior imaging is significant increase in mural thrombus within the dilated common iliac arteries bilaterally, especially on the right. This is most likely the source distal emboli to the right distal leg. Mural thrombus in the internal iliac artery aneurysms is stable to slightly more prominent. 4. Stable diffuse enlargement bilateral external iliac and common femoral arteries. The left common femoral artery does contain significant posterior wall mural thrombus. NON-VASCULAR 1. The most significant nonvascular finding is potential development of a mass of the pancreas at the juncture of the head and body causing progressive atrophy of the tail of the pancreas with associated pancreatic ductal dilatation. On the venous phase of imaging, delineation of the potential mass is quite vague but the region of mass may measure as much as 3 cm. There may be some issue with immediate performance of MRI in the setting recent coiling of the gastroduodenal artery even if the coils are MRI compatible. This can be checked with the manufacturer recommendation. If MRI can be performed immediately, an MRI of the abdomen with and without gadolinium would be helpful for further evaluation. If there is going to be significant delay, consider EUS characterization of the pancreas. 2. Diffuse body wall anasarca and edema of the mesenteric fat and retroperitoneum likely reflects low albumin and malnutrition. 3. Prominent gastric folds and duodenal folds without evidence of bowel obstruction or perforation. Electronically Signed   By: Aletta Edouard M.D.   On:  09/11/2018 12:36    Assessment/Plan: Diagnosis: Debility Labs independently reviewed.  Records reviewed and summated above.  1. Does the need for close, 24 hr/day medical supervision in concert with the patient's rehab needs make it unreasonable for this patient to be served in a less intensive setting? Potentially  2. Co-Morbidities requiring supervision/potential complications: chronic back pain (Biofeedback training with therapies to help reduce reliance on opiate pain medications, monitor pain control during therapies, and sedation at rest and titrate to maximum efficacy to ensure participation and gains in therapies), DM (Monitor in accordance with exercise and adjust meds as necessary), abdominal aortic aneurysm, cardiomyopathy with diastolic congestive heart failure (monitor for signs symptoms of fluid overload), HTN (monitor and provide prns in accordance with increased physical exertion and pain), tobacco/cocaine abuse (counsel), hemorrhagic shock with acute blood loss anemia (repeat labs, transfuse to ensure appropriate perfusion for increased activity tolerance), anticoagulation (transition from heparin GTT to oral medication when appropriate) 3. Due to safety, skin/wound care, disease management, pain management and patient education, does the patient require 24 hr/day rehab nursing? Potentially 4. Does the patient require coordinated care of a physician, rehab nurse, PT (1-2 hrs/day, 5 days/week) and OT (1-2 hrs/day, 5 days/week) to address physical and functional deficits in the context of the above medical diagnosis(es)? Potentially Addressing deficits in the following areas: balance, endurance, locomotion, strength, transferring, bathing, dressing, toileting and psychosocial support 5. Can the patient actively participate in an intensive therapy program of at least 3 hrs of therapy per day at least 5 days per week? Potentially 6. The potential for patient to make measurable gains while  on inpatient rehab is good 7. Anticipated functional outcomes upon discharge  from inpatient rehab are modified independent and supervision  with PT, modified independent and supervision with OT, n/a with SLP. 8. Estimated rehab length of stay to reach the above functional goals is: 4-7 days. 9. Anticipated D/C setting: Home 10. Anticipated post D/C treatments: HH therapy and Home excercise program 11. Overall Rehab/Functional Prognosis: good  RECOMMENDATIONS: This patient's condition is appropriate for continued rehabilitative care in the following setting: Patient predominantly limited by pain at this time.  Anticipate as pain improves and medical work-up complete patient will continue to make functional gains.  Do not anticipate he will require CIR at this time.  Recommend home with home health. Patient has agreed to participate in recommended program. Yes Note that insurance prior authorization may be required for reimbursement for recommended care.  Comment: Rehab Admissions Coordinator to follow up.   I have personally performed a face to face diagnostic evaluation, including, but not limited to relevant history and physical exam findings, of this patient and developed relevant assessment and plan.  Additionally, I have reviewed and concur with the physician assistant's documentation above.   Delice Lesch, MD, ABPMR Lavon Paganini Angiulli, PA-C 09/13/2018

## 2018-09-13 NOTE — Progress Notes (Signed)
[facility] for Heparin Indication: Arterial embolism  Allergies  Allergen Reactions  . Lisinopril Anaphylaxis    Patient Measurements: Height: 5\' 11"  (180.3 cm) Weight: 147 lb 0.8 oz (66.7 kg) IBW/kg (Calculated) : 75.3 Heparin Dosing Weight: 66.7  Vital Signs: Temp: 98.4 F (36.9 C) (12/22 2143) Temp Source: Oral (12/22 2143) BP: 121/88 (12/22 2143) Pulse Rate: 81 (12/22 2143)  Labs: Recent Labs    09/10/18 0412 09/11/18 0410 09/11/18 1624 09/12/18 0415 09/12/18 0945 09/12/18 1322 09/12/18 1928 09/12/18 2318  HGB 9.1* 10.0*  --  8.6* 9.6* 8.5* 8.7* 8.7*  HCT 26.7* 29.3*  --  25.9* 28.9* 25.5* 25.8* 26.2*  PLT 151 208  --  210 241 213 240  --   APTT  --   --  30 30  --   --   --   --   HEPARINUNFRC  --   --   --  <0.10*  --   --  <0.10*  --   CREATININE 0.57* 0.60*  --  0.75  --   --   --   --     Estimated Creatinine Clearance: 96.1 mL/min (by C-G formula based on SCr of 0.75 mg/dL).   Medical History: Past Medical History:  Diagnosis Date  . Back pain, chronic   . Diabetes ([place])   . Dilated cardiomyopathy ([place])    Echo 2/18: EF 45-50, diff HK, Gr 1 DD, trivial AI/TR  . HLD (hyperlipidemia)   . Hypertension   . IDDM (insulin dependent diabetes mellitus) ([place])   . Ruptured lumbar disc    Assessment: 57 yo male with artial embolus s/p embolectomy. Patient had development of hematoma with compartment syndrome as well as hemorrhagic shock secondary to a bleeding duodenal ulcer.   Heparin level <0.1, Hgb stable at 8.7. However, patient still having bloody stools. Repeat Hgb pending for midnight. D/W MD on call, will hold on increasing heparin until results of midnight H&H are back. If stable, will increase cautiously.  12/23 AM update: repeat Hgb tonight stable at 8.7, plan for slow increase in heparin dose  Goal of Therapy:  Heparin level 0.3-0.5 units/mL Monitor platelets by anticoagulation protocol: Yes   Plan:   Inc heparin to 750 units/hr Re-check heparin level in 6-8 hours Close monitoring of Hgb  Narda Bonds, PharmD, BCPS Clinical Pharmacist Phone: [phone]

## 2018-09-13 NOTE — Progress Notes (Signed)
PROGRESS NOTE        PATIENT DETAILS Name: Ryan Medina Age: 57 y.o. Sex: male Date of Birth: 05-22-1961 Admit Date: 09/03/2018 Admitting Physician [name], MD ZOX:[id number], No Pcp Per  Brief Narrative: Patient is a 57 y.o. male with history of cocaine use, chronic systolic heart failure presented to the hospital on 12/13 with a right ischemic leg secondary to acute popliteal and tibial embolus, underwent embolectomy on 12/13 and subsequently was placed on a heparin drip.  He unfortunately developed a acute right lower leg hematoma with compartment syndrome requiring fasciotomy on 12/15.  Further hospital course was complicated by development of hemorrhagic shock with acute blood loss anemia secondary to a bleeding duodenal ulcer-requiring ICU transfer-intubation for airway protection and IR evaluation with mesenteric angiogram and GDA embolization.  Upon stability he was transferred back to the triad hospitalist service on 12/19.  See below for further details.  Subjective:  Patient in bed, appears comfortable, denies any headache, no fever, no chest pain or pressure, no shortness of breath , no abdominal pain. No focal weakness.   Assessment/Plan:  Right ischemic leg secondary to popliteal artery/tibial artery embolism-s/p embolectomy on 09/81 complicated by right lower extremity hematoma with compartment syndrome requiring fasciotomy on 12/15:   Source of embolization unclear, required fasciotomy on 09/05/2018 by vascular surgery status post closure of fasciotomy on 09/10/2018.  His course was complicated by large hematoma in the right leg along with significant life-threatening GI bleed.  Is currently on heparin drip , case discussed with vascular surgeon Dr. Monica Martinez who wants to continue anticoagulation as much as possible.  Also TEE requested on 09/11/2018 to figure out etiology of his arterial embolus.  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  Upper GI bleeding with hemorrhagic shock and acute blood loss anemia: Developed hemorrhagic shock, was seen by GI, required multiple PRBC transfusions last transfusion on 09/09/2018.  Was seen by GI and eventually underwent gastroduodenal artery embolization on 09/07/2018.  Total 8 units of packed RBC, 1 unit of FFP and 1 unit of platelets transfused.  Currently H&H seems to have stabilized. GI note noted from 09/10/2018 (okay with heparin challenge).   Will cautiously monitor H&H, currently evidence of old blood in stool with relatively stable H&H, if H&H drops obviously heparin will have to be held again.  However will continue heparin as vascular surgery strongly feels that heparin continuation is needed at this time.   Acute hypoxic respiratory failure: Intubated when he developed hemorrhagic shock secondary to GI bleeding, successfully extubated-currently stable on 2 L of oxygen via nasal cannula.  Chronic systolic heart failure (EF 45-50% by TTE on 09/04/2018): Reasonably well compensated-follow weights, volume status, electrolytes closely.  Cocaine use/tobacco use: Counseled-I am still not sure if he has any intention of quitting.  3 cm AAA: Stable for follow-up in the outpatient setting  ? new pancreatic mass noted on CT scan on 09/11/2018.  Noted MRI, CA 19.9 has been ordered and pending, GI has been requested to evaluate the patient on 09/12/2018.  They are supposed to follow-up soon.  Insulin-dependent DM-2: Last A1c on 12/2 was 18.2.  Since he was n.p.o.-and on a clear liquid diet-he was allowed to have mild hyperglycemia-since CBGs are now stable-we will start low-dose Lantus at 10 units nightly.  Follow and adjust accordingly.  CBG (last 3)  Recent Labs    09/12/18 1700 09/12/18 2141 09/13/18 0757  GLUCAP 293* 127* 226*      DVT Prophylaxis: Heparin full dose started on 09/10/2018 by vascular surgery  Code Status: Full code    Family Communication: None at bedside  Disposition Plan: Remain inpatient-requires several more days of hospitalization before consideration of discharge  Antimicrobial agents: Anti-infectives (From admission, onward)   Start     Dose/Rate Route Frequency Ordered Stop   09/10/18 2130  ceFAZolin (ANCEF) IVPB 2g/100 mL premix     2 g 200 mL/hr over 30 Minutes Intravenous Every 8 hours 09/10/18 1634 09/11/18 0700   09/10/18 0800  ceFAZolin (ANCEF) IVPB 1 g/50 mL premix    Note to Pharmacy:  Send with pt to OR   1 g 100 mL/hr over 30 Minutes Intravenous On call 09/09/18 0756 09/10/18 1407   09/07/18 1300  erythromycin 250 mg in sodium chloride 0.9 % 100 mL IVPB     250 mg 100 mL/hr over 60 Minutes Intravenous Once 09/07/18 1125 09/07/18 2351   09/06/18 0000  ceFAZolin (ANCEF) IVPB 1 g/50 mL premix    Note to Pharmacy:  Send with pt to OR   1 g 100 mL/hr over 30 Minutes Intravenous On call 09/05/18 0849 09/05/18 0952   09/03/18 2245  ceFAZolin (ANCEF) IVPB 2g/100 mL premix     2 g 200 mL/hr over 30 Minutes Intravenous Every 8 hours 09/03/18 2236 09/04/18 1444   09/03/18 1615  ceFAZolin (ANCEF) IVPB 2g/100 mL premix  Status:  Discontinued     2 g 200 mL/hr over 30 Minutes Intravenous On call to O.R. 09/03/18 1600 09/03/18 2035      Procedures: 12/17>>Mesenteric angiogram and coil embolization of the GDA  12/17>>EGD  12/15>> 1.  Evacuation hematoma right leg 2.  Evacuation of lymphocele right leg 3.  4 compartment fasciotomy 4.  Placement of VAC (medial and lateral)  12/13>> 1.  Ultrasound-guided access to the left common femoral artery 2.  Aortogram with bilateral iliac arteriogram 3.  Selective catheterization of the right external iliac artery with right lower extremity runoff   CONSULTS:  pulmonary/intensive care, vascular surgery and IR  Time spent: 40 minutes-Greater than 50% of this time was spent in counseling, explanation of diagnosis, planning of further  management, and coordination of care.  MEDICATIONS: Scheduled Meds: . atorvastatin  20 mg Oral Daily  . chlorhexidine gluconate (MEDLINE KIT)  15 mL Mouth Rinse BID  . insulin aspart  0-9 Units Subcutaneous TID WC  . mouth rinse  15 mL Mouth Rinse BID  . pantoprazole  40 mg Oral BID  . tamsulosin  0.4 mg Oral Daily   Continuous Infusions: . sodium chloride    . heparin 750 Units/hr (09/13/18 0037)   PRN Meds:.sodium chloride, ipratropium-albuterol, LORazepam, ondansetron **OR** ondansetron (ZOFRAN) IV, oxyCODONE-acetaminophen, polyethylene glycol, senna-docusate, sodium chloride flush   PHYSICAL EXAM: Vital signs: Vitals:   09/12/18 0510 09/12/18 1603 09/12/18 2143 09/13/18 0418  BP: (!) 130/94 (!) 117/92 121/88 (!) 137/93  Pulse: (!) 101 76 81 87  Resp: _0 Temp: 99.1 F (37.3 C) 98.8 F (37.1 C) 98.4 F (36.9 C) 98.6 F (37 C)  TempSrc: Oral Oral Oral Oral  SpO2: 98% 98% 99% 100%  Weight:      Height:       Filed Weights   09/03/18 1650  Weight: 66.7 kg   Body mass index is 20.51 kg/m.  Exam  Awake Alert, Oriented X 3, No new F.N deficits, Normal affect [place].AT,PERRAL Supple Neck,No JVD, No cervical lymphadenopathy appriciated.  Symmetrical Chest wall movement, Good air movement bilaterally, CTAB RRR,No Gallops, Rubs or new Murmurs, No Parasternal Heave +ve B.Sounds, Abd Soft, No tenderness, No organomegaly appriciated, No rebound - guarding or rigidity. No Cyanosis, Clubbing or edema, No new Rash or bruise   I have personally reviewed following labs and imaging studies  LABORATORY DATA: CBC: Recent Labs  Lab 09/07/18 1633 09/08/18 0255 09/08/18 1454 09/08/18 2326  09/09/18 0711  09/12/18 0415 09/12/18 0945 09/12/18 1322 09/12/18 1928 09/12/18 2318 09/13/18 0352  WBC 10.8* 11.3* 12.3* 13.4*  --  12.4*   < > 8.0 8.1 7.2 7.5  --  7.9  NEUTROABS 10.4* 9.5* 11.2* 11.5*  --  11.5*  --   --   --   --   --   --   --   HGB 6.8* 8.7* 7.9* 7.0*    < > 7.3*   < > 8.6* 9.6* 8.5* 8.7* 8.7* 8.5*  HCT 20.3* 25.9* 24.5* 21.4*   < > 22.3*   < > 25.9* 28.9* 25.5* 25.8* 26.2* 24.9*  MCV 84.6 85.8 85.4 83.9  --  84.8   < > 83.0 85.0 82.5 84.6  --  84.4  PLT 216 231 165 146*  --  140*   < > 210 241 213 240  --  246   < > = values in this interval not displayed.    Basic Metabolic Panel: Recent Labs  Lab 09/08/18 0500 09/09/18 0437 09/09/18 0711 09/10/18 0412 09/11/18 0410 09/12/18 0415 09/13/18 0352  NA 141  --  133* 133* 133* 135 138  K 4.0  --  3.5 3.3* 3.6 3.4* 3.4*  CL 110  --  103 102 100 104 100  CO2 25  --  _0 GLUCOSE 94  --  290* 221* 294* 297* 254*  BUN 32*  --  _1 CREATININE 0.69  --  0.60* 0.57* 0.60* 0.75 0.49*  CALCIUM 7.3*  --  6.8* 7.0* 7.0* 7.1* 7.4*  MG 2.1 1.8  --  1.8  --  1.6* 1.7  PHOS 2.8 2.0*  --  2.3*  --   --   --     GFR: Estimated Creatinine Clearance: 96.1 mL/min (A) (by C-G formula based on SCr of 0.49 mg/dL (L)).  Liver Function Tests: Recent Labs  Lab 09/07/18 1800 09/08/18 0500 09/09/18 0711 09/10/18 0412 09/12/18 0415 09/13/18 0352  AST 26  --  32 _2 ALT 15  --  _3 ALKPHOS 24*  --  44 52 52 53  BILITOT 0.6  --  0.7 0.6 0.5 0.6  PROT 3.3*  --  3.5* 3.8* 3.7* 4.2*  ALBUMIN 1.4* 1.5* 1.3* 1.3* 1.2* 1.4*   No results for input(s): LIPASE, AMYLASE in the last 168 hours. Recent Labs  Lab 09/07/18 1630  AMMONIA 25    Coagulation Profile: Recent Labs  Lab 09/07/18 1633  INR 1.28    Cardiac Enzymes: No results for input(s): CKTOTAL, CKMB, CKMBINDEX, TROPONINI in the last 168 hours.  BNP (last 3 results) No results for input(s): PROBNP in the last 8760 hours.  HbA1C: No results for input(s): HGBA1C in the last 72 hours.  CBG: Recent Labs  Lab 09/11/18 2048 09/12/18 1205 09/12/18 1700 09/12/18 2141 09/13/18 0757  GLUCAP 212* 309*  293* 127* 226*    Lipid Profile: No results for input(s): CHOL, HDL, LDLCALC, TRIG, CHOLHDL,  LDLDIRECT in the last 72 hours.  Thyroid Function Tests: No results for input(s): TSH, T4TOTAL, FREET4, T3FREE, THYROIDAB in the last 72 hours.  Anemia Panel: No results for input(s): VITAMINB12, FOLATE, FERRITIN, TIBC, IRON, RETICCTPCT in the last 72 hours.  Urine analysis:    Component Value Date/Time   COLORURINE STRAW (A) 08/22/2018 2054   APPEARANCEUR CLEAR 08/22/2018 2054   LABSPEC 1.027 08/22/2018 2054   PHURINE 6.0 08/22/2018 2054   GLUCOSEU >=500 (A) 08/22/2018 2054   HGBUR NEGATIVE 08/22/2018 2054   [place] NEGATIVE 08/22/2018 2054   KETONESUR 80 (A) 08/22/2018 2054   PROTEINUR NEGATIVE 08/22/2018 2054   UROBILINOGEN 4.0 (H) 12/05/2016 0921   NITRITE NEGATIVE 08/22/2018 2054   LEUKOCYTESUR NEGATIVE 08/22/2018 2054    Sepsis Labs: Lactic Acid, Venous    Component Value Date/Time   LATICACIDVEN 1.7 09/07/2018 1600    MICROBIOLOGY: Recent Results (from the past 240 hour(s))  Surgical pcr screen     Status: None   Collection Time: 09/10/18  3:39 AM  Result Value Ref Range Status   MRSA, PCR NEGATIVE NEGATIVE Final   Staphylococcus aureus NEGATIVE NEGATIVE Final    Comment: (NOTE) The Xpert SA Assay (FDA approved for NASAL specimens in patients 2 years of age and older), is one component of a comprehensive surveillance program. It is not intended to diagnose infection nor to guide or monitor treatment. Performed at [facility], [place] [address] [place], [place] [address]     RADIOLOGY STUDIES/RESULTS: Dg Abd 1 View  Result Date: 09/07/2018 CLINICAL DATA:  OG tube placement. EXAM: ABDOMEN - 1 VIEW COMPARISON:  CT abdomen pelvis 08/22/2018. FINDINGS: OG tube is in place with the side port and tip in the body of the stomach. The stomach is distended. IMPRESSION: OG tube in good position. Electronically Signed   By: Inge Rise M.D.   On: 09/07/2018 13:31   Ct Angio Chest Pe W Or Wo Contrast  Result Date: 09/06/2018 CLINICAL DATA:  Evaluate  thoracic aortic aneurysm. EXAM: CT ANGIOGRAPHY CHEST WITH CONTRAST TECHNIQUE: Multidetector CT imaging of the chest was performed using the standard protocol during bolus administration of intravenous contrast. Multiplanar CT image reconstructions and MIPs were obtained to evaluate the vascular anatomy. CONTRAST:  65 mL ISOVUE-370 IOPAMIDOL (ISOVUE-370) INJECTION 76% COMPARISON:  01/30/2018; 10/14/2016; 04/25/2026 FINDINGS: Vascular Findings: No evidence of thoracic aortic aneurysm with measurements as follows. No definite evidence of thoracic aortic dissection or periaortic stranding on this nongated examination. Bovine configuration of the aortic arch. The branch vessels of the aortic arch appear widely patent throughout their imaged courses. The descending thoracic aorta is of normal caliber and widely patent without hemodynamically significant stenosis. There is a very minimal amount of atherosclerotic plaque involving the proximal aspect of the descending thoracic aorta. Cardiomegaly.  No pericardial effusion. Although this examination was not tailored for the evaluation the pulmonary arteries, there are no discrete filling defects within the central pulmonary arterial tree to suggest central pulmonary embolism. Borderline enlarged caliber the main pulmonary artery measuring 32 mm in diameter (image 67, series 6) ------------------------------------------------------------- Thoracic aortic measurements: Sinotubular junction 33 mm as measured in greatest oblique coronal dimension. Proximal ascending aorta 35 mm as measured in greatest oblique axial dimension at the level of the main pulmonary artery (image 70, series 6); and approximately 36 mm in greatest oblique short axis coronal diameter (coronal image 48,  series 9). Aortic arch aorta 28 mm as measured in greatest oblique sagittal dimension. Proximal descending thoracic aorta 30 mm as measured in greatest oblique axial dimension at the level of the main  pulmonary artery. Distal descending thoracic aorta 29 mm as measured in greatest oblique axial dimension at the level of the diaphragmatic hiatus. Review of the MIP images confirms the above findings. ------------------------------------------------------------- Non-Vascular Findings: Mediastinum/Lymph Nodes: Prominent right hilar lymph nodes are not enlarged by size criteria with index right suprahilar lymph node measuring 0.8 cm in greatest short axis diameter (image 71, series 6) and index right infrahilar lymph node measuring 0.7 cm (image 88). No definitive bulky mediastinal, hilar or axillary lymphadenopathy. Lungs/Pleura: Interval development of small bilateral pleural effusions with worsening bibasilar consolidative opacities and associated air bronchograms, right greater than left. Interval development of an approximately 1.0 x 0.5 cm subpleural consolidative opacity within the right upper lobe (image 36, series 7). Unchanged punctate (approximately 3 mm) calcified granuloma within the left lower lobe (image 75, series 3). Evaluation for additional pulmonary nodules is degraded secondary to bibasilar airspace opacities. The central pulmonary airways appear widely patent. Upper abdomen: Fluid distention of the mid and distal aspects of the esophagus with marked distension of the imaged superior aspect of the stomach. Musculoskeletal: No acute or aggressive osseous abnormalities. There is incomplete fusion involving the spinous processes of multiple thoracic vertebral bodies, unchanged. Regional soft tissues appear normal. Normal appearance of the thyroid gland. IMPRESSION: 1. No evidence of thoracic aortic aneurysm or dissection. 2. Small bilateral effusions with associated bibasilar consolidative opacities with associated air bronchograms, atelectasis versus infiltrate. Follow-up chest radiograph in 4-6 weeks after treatment is recommended to ensure resolution. 3. Fluid distension of the mid and distal  aspects of the esophagus as well as marked distension of the imaged superior aspect stomach - correlation for gastric outlet obstructive symptoms is advised. 4. Cardiomegaly with enlargement of the caliber the main pulmonary artery as could be seen in the setting of pulmonary arterial hypertension. Electronically Signed   By: Sandi Mariscal M.D.   On: 09/06/2018 09:20   Mr Abdomen W Wo Contrast  Result Date: 09/12/2018 CLINICAL DATA:  Suspicion of pancreatic neck mass on CT. EXAM: MRI ABDOMEN WITHOUT AND WITH CONTRAST TECHNIQUE: Multiplanar multisequence MR imaging of the abdomen was performed both before and after the administration of intravenous contrast. CONTRAST:  6 cc Gadavist COMPARISON:  CT of 1 day prior. Stone study of 08/22/2018. More remote CT of 12/19/2017. FINDINGS: Moderatemultifactorial degradation, including respiratory motion and susceptibility artifact from embolization coils. Lower chest: Bilateral pleural fluid with bibasilar airspace disease. Normal heart size. Hepatobiliary: Left hepatic lobe 9 mm cyst. Too small to characterizea right hepatic lobe lesion is most likely a cyst at 2 mm. Normal gallbladder, without biliary ductal dilatation. Pancreas: An area of soft tissue fullness and restricted diffusion is identified within the pancreatic neck, corresponding to the abnormality on CT. This measures on the order of 3.9 x 3.6 cm on image 76/12. Also 3.6 x 3.6 cm on image 35/31. Results in pancreatic duct dilatation within the upstream body and tail on image 17/11. Involves the celiac and its branches including on image 75/12. Spleen:  Grossly within normal limits. Adrenals/Urinary Tract: Normal adrenal glands. Grossly normal kidneys, without hydronephrosis. Stomach/Bowel: Gastric distension with proximal wall and fold thickening, including on image 73/12. Grossly normal abdominal bowel loops. Vascular/Lymphatic: Normal caliber of the aorta. Celiac presumed tumor involvement, as detailed  above. Limited evaluation for abdominal adenopathy.  Other:  Small volume abdominal ascites. Musculoskeletal: No acute osseous abnormality. IMPRESSION: 1. Moderate motion and susceptibility artifact degradation throughout. 2. Confirmation of a pancreatic neck mass, most consistent with adenocarcinoma. Involvement of the celiac and its branches, most consistent with a non-operative lesion. 3. Consider endoscopic ultrasound sampling. 4. Gastric wall and fold thickening, suggesting gastritis. 5. Small bilateral pleural effusions with adjacent airspace disease. Electronically Signed   By: Abigail Miyamoto M.D.   On: 09/12/2018 03:04   Ir Angiogram Visceral Selective  Result Date: 09/07/2018 INDICATION: 57 year old male with large volume upper GI bleed secondary to a very large duodenal ulcer. Patient underwent endoscopic intervention but continues to require pressors and large volume transfusion. He presents for mesenteric arteriography and embolization of any active bleeding and prophylactic embolization of the gastroduodenal artery. EXAM: IR ULTRASOUND GUIDANCE VASC ACCESS RIGHT; ADDITIONAL ARTERIOGRAPHY; IR EMBO ART VEN HEMORR LYMPH EXTRAV INC GUIDE ROADMAPPING; SELECTIVE VISCERAL ARTERIOGRAPHY 1. Ultrasound-guided vascular access 2. Catheterization of the left gastric artery where it arises directly from the aorta with arteriogram 3. Catheterization of the celiac artery with arteriogram 4. Catheterization of the common hepatic artery with arteriogram 5. Catheterization of the gastroduodenal artery with arteriogram 6. Coil embolization of the gastroduodenal artery 7. Catheterization of the superior mesenteric artery with arteriogram MEDICATIONS: None ANESTHESIA/SEDATION: Patient intubated and on propofol drip CONTRAST:  70 mL Isovue 370 FLUOROSCOPY TIME:  Fluoroscopy Time: 11 minutes 30 seconds (487 mGy). COMPLICATIONS: None immediate. PROCEDURE: Informed consent was obtained from the patient following explanation of  the procedure, risks, benefits and alternatives. The patient understands, agrees and consents for the procedure. All questions were addressed. A time out was performed prior to the initiation of the procedure. Maximal barrier sterile technique utilized including caps, mask, sterile gowns, sterile gloves, large sterile drape, hand hygiene, and Betadine prep. The right common femoral artery was interrogated with ultrasound and found to be widely patent. An image was obtained and stored for the medical record. Local anesthesia was attained by infiltration with 1% lidocaine. A small dermatotomy was made. Under real-time sonographic guidance, the vessel was punctured with a 21 gauge micropuncture needle. Using standard technique, the initial micro needle was exchanged over a 0.018 micro wire for a transitional 4 [place] micro sheath. The micro sheath was then exchanged over a 0.035 wire for a 5 French vascular sheath. A C2 cobra catheter was advanced in the abdominal aorta over a Bentson wire. The catheter was used to select the first vessel arising from the ventral aorta. Contrast was injected. This is a left gastric artery arising directly from the aorta. Anatomy is normal. No evidence of active bleeding. The Cobra catheter was then used to select the origin of the celiac artery. Arteriography was performed. The splenic artery is small in caliber. The hepatic artery demonstrates normal hepatic arterial anatomy. The gastroduodenal artery is slightly irregular with spasm in the proximal segment. No evidence of active extravasation. A glidewire was successfully advanced into the common hepatic artery and the C2 cobra catheter was advanced into the common hepatic artery. Additional arteriography was performed in multiple obliquities. Again, the proximal gastroduodenal artery is irregular and spasmodic. No evidence of dissection, aneurysm or active bleeding. A Lantern microcatheter was then advanced over a Fathom 16 wire and  used to select the distal aspect of the gastroduodenal artery. Arteriography was performed. The gastroepiploic arteries are widely patent. No evidence of active bleeding. Coil embolization was then performed using a 4 mm Ruby POD detachable micro coil followed by  a 45 cm Ruby packing coil. Post embolization arteriography demonstrates complete occlusion of the vessel with stasis in the proximal segment. The Lantern microcatheter was brought back into the common hepatic artery and arteriography was performed. Again, complete occlusion of the gastroduodenal artery. The right gastric artery is visualized. No evidence of active bleeding from the right gastric artery. The microcatheter was removed. The C2 cobra catheter was used to select the superior mesenteric artery. Arteriography was performed. Patent pancreaticoduodenal arcade supplying the gastroepiploic artery. No evidence of active hemorrhage. The C2 cobra catheter was removed. A limited right common femoral arteriogram was performed confirming common femoral arterial access. Hemostasis was attained with the assistance of a Cordis ExoSeal extra arterial vascular plug. IMPRESSION: 1. No evidence of active hemorrhage. 2. 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 3. Prophylactic coil embolization of the gastroduodenal artery. Signed, Criselda Peaches, MD, [facility] Radiology Electronically Signed   By: Jacqulynn Cadet M.D.   On: 09/07/2018 22:32   Ir Angiogram Visceral Selective  Result Date: 09/07/2018 INDICATION: 57 year old male with large volume upper GI bleed secondary to a very large duodenal ulcer. Patient underwent endoscopic intervention but continues to require pressors and large volume transfusion. He presents for mesenteric arteriography and embolization of any active bleeding and prophylactic embolization of the gastroduodenal artery. EXAM: IR ULTRASOUND GUIDANCE  VASC ACCESS RIGHT; ADDITIONAL ARTERIOGRAPHY; IR EMBO ART VEN HEMORR LYMPH EXTRAV INC GUIDE ROADMAPPING; SELECTIVE VISCERAL ARTERIOGRAPHY 1. Ultrasound-guided vascular access 2. Catheterization of the left gastric artery where it arises directly from the aorta with arteriogram 3. Catheterization of the celiac artery with arteriogram 4. Catheterization of the common hepatic artery with arteriogram 5. Catheterization of the gastroduodenal artery with arteriogram 6. Coil embolization of the gastroduodenal artery 7. Catheterization of the superior mesenteric artery with arteriogram MEDICATIONS: None ANESTHESIA/SEDATION: Patient intubated and on propofol drip CONTRAST:  70 mL Isovue 370 FLUOROSCOPY TIME:  Fluoroscopy Time: 11 minutes 30 seconds (487 mGy). COMPLICATIONS: None immediate. PROCEDURE: Informed consent was obtained from the patient following explanation of the procedure, risks, benefits and alternatives. The patient understands, agrees and consents for the procedure. All questions were addressed. A time out was performed prior to the initiation of the procedure. Maximal barrier sterile technique utilized including caps, mask, sterile gowns, sterile gloves, large sterile drape, hand hygiene, and Betadine prep. The right common femoral artery was interrogated with ultrasound and found to be widely patent. An image was obtained and stored for the medical record. Local anesthesia was attained by infiltration with 1% lidocaine. A small dermatotomy was made. Under real-time sonographic guidance, the vessel was punctured with a 21 gauge micropuncture needle. Using standard technique, the initial micro needle was exchanged over a 0.018 micro wire for a transitional 4 [place] micro sheath. The micro sheath was then exchanged over a 0.035 wire for a 5 French vascular sheath. A C2 cobra catheter was advanced in the abdominal aorta over a Bentson wire. The catheter was used to select the first vessel arising from the  ventral aorta. Contrast was injected. This is a left gastric artery arising directly from the aorta. Anatomy is normal. No evidence of active bleeding. The Cobra catheter was then used to select the origin of the celiac artery. Arteriography was performed. The splenic artery is small in caliber. The hepatic artery demonstrates normal hepatic arterial anatomy. The gastroduodenal artery is slightly irregular with spasm in the proximal segment. No evidence of active extravasation. A glidewire was successfully advanced  into the common hepatic artery and the C2 cobra catheter was advanced into the common hepatic artery. Additional arteriography was performed in multiple obliquities. Again, the proximal gastroduodenal artery is irregular and spasmodic. No evidence of dissection, aneurysm or active bleeding. A Lantern microcatheter was then advanced over a Fathom 16 wire and used to select the distal aspect of the gastroduodenal artery. Arteriography was performed. The gastroepiploic arteries are widely patent. No evidence of active bleeding. Coil embolization was then performed using a 4 mm Ruby POD detachable micro coil followed by a 45 cm Ruby packing coil. Post embolization arteriography demonstrates complete occlusion of the vessel with stasis in the proximal segment. The Lantern microcatheter was brought back into the common hepatic artery and arteriography was performed. Again, complete occlusion of the gastroduodenal artery. The right gastric artery is visualized. No evidence of active bleeding from the right gastric artery. The microcatheter was removed. The C2 cobra catheter was used to select the superior mesenteric artery. Arteriography was performed. Patent pancreaticoduodenal arcade supplying the gastroepiploic artery. No evidence of active hemorrhage. The C2 cobra catheter was removed. A limited right common femoral arteriogram was performed confirming common femoral arterial access. Hemostasis was attained  with the assistance of a Cordis ExoSeal extra arterial vascular plug. IMPRESSION: 1. No evidence of active hemorrhage. 2. 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 3. Prophylactic coil embolization of the gastroduodenal artery. Signed, Criselda Peaches, MD, [place] [facility] Radiology Electronically Signed   By: Jacqulynn Cadet M.D.   On: 09/07/2018 22:32   Ir Angiogram Selective Each Additional Vessel  Result Date: 09/07/2018 INDICATION: 57 year old male with large volume upper GI bleed secondary to a very large duodenal ulcer. Patient underwent endoscopic intervention but continues to require pressors and large volume transfusion. He presents for mesenteric arteriography and embolization of any active bleeding and prophylactic embolization of the gastroduodenal artery. EXAM: IR ULTRASOUND GUIDANCE VASC ACCESS RIGHT; ADDITIONAL ARTERIOGRAPHY; IR EMBO ART VEN HEMORR LYMPH EXTRAV INC GUIDE ROADMAPPING; SELECTIVE VISCERAL ARTERIOGRAPHY 1. Ultrasound-guided vascular access 2. Catheterization of the left gastric artery where it arises directly from the aorta with arteriogram 3. Catheterization of the celiac artery with arteriogram 4. Catheterization of the common hepatic artery with arteriogram 5. Catheterization of the gastroduodenal artery with arteriogram 6. Coil embolization of the gastroduodenal artery 7. Catheterization of the superior mesenteric artery with arteriogram MEDICATIONS: None ANESTHESIA/SEDATION: Patient intubated and on propofol drip CONTRAST:  70 mL Isovue 370 FLUOROSCOPY TIME:  Fluoroscopy Time: 11 minutes 30 seconds (487 mGy). COMPLICATIONS: None immediate. PROCEDURE: Informed consent was obtained from the patient following explanation of the procedure, risks, benefits and alternatives. The patient understands, agrees and consents for the procedure. All questions were addressed. A time out was performed prior to the  initiation of the procedure. Maximal barrier sterile technique utilized including caps, mask, sterile gowns, sterile gloves, large sterile drape, hand hygiene, and Betadine prep. The right common femoral artery was interrogated with ultrasound and found to be widely patent. An image was obtained and stored for the medical record. Local anesthesia was attained by infiltration with 1% lidocaine. A small dermatotomy was made. Under real-time sonographic guidance, the vessel was punctured with a 21 gauge micropuncture needle. Using standard technique, the initial micro needle was exchanged over a 0.018 micro wire for a transitional 4 [place] micro sheath. The micro sheath was then exchanged over a 0.035 wire for a 5 French vascular sheath. A C2 cobra catheter was advanced in the abdominal  aorta over a Bentson wire. The catheter was used to select the first vessel arising from the ventral aorta. Contrast was injected. This is a left gastric artery arising directly from the aorta. Anatomy is normal. No evidence of active bleeding. The Cobra catheter was then used to select the origin of the celiac artery. Arteriography was performed. The splenic artery is small in caliber. The hepatic artery demonstrates normal hepatic arterial anatomy. The gastroduodenal artery is slightly irregular with spasm in the proximal segment. No evidence of active extravasation. A glidewire was successfully advanced into the common hepatic artery and the C2 cobra catheter was advanced into the common hepatic artery. Additional arteriography was performed in multiple obliquities. Again, the proximal gastroduodenal artery is irregular and spasmodic. No evidence of dissection, aneurysm or active bleeding. A Lantern microcatheter was then advanced over a Fathom 16 wire and used to select the distal aspect of the gastroduodenal artery. Arteriography was performed. The gastroepiploic arteries are widely patent. No evidence of active bleeding. Coil  embolization was then performed using a 4 mm Ruby POD detachable micro coil followed by a 45 cm Ruby packing coil. Post embolization arteriography demonstrates complete occlusion of the vessel with stasis in the proximal segment. The Lantern microcatheter was brought back into the common hepatic artery and arteriography was performed. Again, complete occlusion of the gastroduodenal artery. The right gastric artery is visualized. No evidence of active bleeding from the right gastric artery. The microcatheter was removed. The C2 cobra catheter was used to select the superior mesenteric artery. Arteriography was performed. Patent pancreaticoduodenal arcade supplying the gastroepiploic artery. No evidence of active hemorrhage. The C2 cobra catheter was removed. A limited right common femoral arteriogram was performed confirming common femoral arterial access. Hemostasis was attained with the assistance of a Cordis ExoSeal extra arterial vascular plug. IMPRESSION: 1. No evidence of active hemorrhage. 2. 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 3. Prophylactic coil embolization of the gastroduodenal artery. Signed, Criselda Peaches, MD, [place] [facility] Radiology Electronically Signed   By: Jacqulynn Cadet M.D.   On: 09/07/2018 22:32   Ir Angiogram Selective Each Additional Vessel  Result Date: 09/07/2018 INDICATION: 57 year old male with large volume upper GI bleed secondary to a very large duodenal ulcer. Patient underwent endoscopic intervention but continues to require pressors and large volume transfusion. He presents for mesenteric arteriography and embolization of any active bleeding and prophylactic embolization of the gastroduodenal artery. EXAM: IR ULTRASOUND GUIDANCE VASC ACCESS RIGHT; ADDITIONAL ARTERIOGRAPHY; IR EMBO ART VEN HEMORR LYMPH EXTRAV INC GUIDE ROADMAPPING; SELECTIVE VISCERAL ARTERIOGRAPHY 1. Ultrasound-guided  vascular access 2. Catheterization of the left gastric artery where it arises directly from the aorta with arteriogram 3. Catheterization of the celiac artery with arteriogram 4. Catheterization of the common hepatic artery with arteriogram 5. Catheterization of the gastroduodenal artery with arteriogram 6. Coil embolization of the gastroduodenal artery 7. Catheterization of the superior mesenteric artery with arteriogram MEDICATIONS: None ANESTHESIA/SEDATION: Patient intubated and on propofol drip CONTRAST:  70 mL Isovue 370 FLUOROSCOPY TIME:  Fluoroscopy Time: 11 minutes 30 seconds (487 mGy). COMPLICATIONS: None immediate. PROCEDURE: Informed consent was obtained from the patient following explanation of the procedure, risks, benefits and alternatives. The patient understands, agrees and consents for the procedure. All questions were addressed. A time out was performed prior to the initiation of the procedure. Maximal barrier sterile technique utilized including caps, mask, sterile gowns, sterile gloves, large sterile drape, hand hygiene, and Betadine prep. The right common femoral  artery was interrogated with ultrasound and found to be widely patent. An image was obtained and stored for the medical record. Local anesthesia was attained by infiltration with 1% lidocaine. A small dermatotomy was made. Under real-time sonographic guidance, the vessel was punctured with a 21 gauge micropuncture needle. Using standard technique, the initial micro needle was exchanged over a 0.018 micro wire for a transitional 4 [place] micro sheath. The micro sheath was then exchanged over a 0.035 wire for a 5 French vascular sheath. A C2 cobra catheter was advanced in the abdominal aorta over a Bentson wire. The catheter was used to select the first vessel arising from the ventral aorta. Contrast was injected. This is a left gastric artery arising directly from the aorta. Anatomy is normal. No evidence of active bleeding. The Cobra  catheter was then used to select the origin of the celiac artery. Arteriography was performed. The splenic artery is small in caliber. The hepatic artery demonstrates normal hepatic arterial anatomy. The gastroduodenal artery is slightly irregular with spasm in the proximal segment. No evidence of active extravasation. A glidewire was successfully advanced into the common hepatic artery and the C2 cobra catheter was advanced into the common hepatic artery. Additional arteriography was performed in multiple obliquities. Again, the proximal gastroduodenal artery is irregular and spasmodic. No evidence of dissection, aneurysm or active bleeding. A Lantern microcatheter was then advanced over a Fathom 16 wire and used to select the distal aspect of the gastroduodenal artery. Arteriography was performed. The gastroepiploic arteries are widely patent. No evidence of active bleeding. Coil embolization was then performed using a 4 mm Ruby POD detachable micro coil followed by a 45 cm Ruby packing coil. Post embolization arteriography demonstrates complete occlusion of the vessel with stasis in the proximal segment. The Lantern microcatheter was brought back into the common hepatic artery and arteriography was performed. Again, complete occlusion of the gastroduodenal artery. The right gastric artery is visualized. No evidence of active bleeding from the right gastric artery. The microcatheter was removed. The C2 cobra catheter was used to select the superior mesenteric artery. Arteriography was performed. Patent pancreaticoduodenal arcade supplying the gastroepiploic artery. No evidence of active hemorrhage. The C2 cobra catheter was removed. A limited right common femoral arteriogram was performed confirming common femoral arterial access. Hemostasis was attained with the assistance of a Cordis ExoSeal extra arterial vascular plug. IMPRESSION: 1. No evidence of active hemorrhage. 2. Irregular/spastic segment of the  proximal gastroduodenal artery. 3. Prophylactic coil embolization of the gastroduodenal artery. Signed, Criselda Peaches, MD, [place] [facility] Radiology Electronically Signed   By: Jacqulynn Cadet M.D.   On: 09/07/2018 22:32   Ir US Guide Vasc Access Right  Result Date: 09/07/2018 INDICATION: 57 year old male with large volume upper GI bleed secondary to a very large duodenal ulcer. Patient underwent endoscopic intervention but continues to require pressors and large volume transfusion. He presents for mesenteric arteriography and embolization of any active bleeding and prophylactic embolization of the gastroduodenal artery. EXAM: IR ULTRASOUND GUIDANCE VASC ACCESS RIGHT; ADDITIONAL ARTERIOGRAPHY; IR EMBO ART VEN HEMORR LYMPH EXTRAV INC GUIDE ROADMAPPING; SELECTIVE VISCERAL ARTERIOGRAPHY 1. Ultrasound-guided vascular access 2. Catheterization of the left gastric artery where it arises directly from the aorta with arteriogram 3. Catheterization of the celiac artery with arteriogram 4. Catheterization of the common hepatic artery with arteriogram 5. Catheterization of the gastroduodenal artery with arteriogram 6. Coil embolization of the gastroduodenal artery 7. Catheterization of the superior mesenteric artery with arteriogram  MEDICATIONS: None ANESTHESIA/SEDATION: Patient intubated and on propofol drip CONTRAST:  70 mL Isovue 370 FLUOROSCOPY TIME:  Fluoroscopy Time: 11 minutes 30 seconds (487 mGy). COMPLICATIONS: None immediate. PROCEDURE: Informed consent was obtained from the patient following explanation of the procedure, risks, benefits and alternatives. The patient understands, agrees and consents for the procedure. All questions were addressed. A time out was performed prior to the initiation of the procedure. Maximal barrier sterile technique utilized including caps, mask, sterile gowns, sterile gloves, large sterile drape, hand hygiene, and Betadine  prep. The right common femoral artery was interrogated with ultrasound and found to be widely patent. An image was obtained and stored for the medical record. Local anesthesia was attained by infiltration with 1% lidocaine. A small dermatotomy was made. Under real-time sonographic guidance, the vessel was punctured with a 21 gauge micropuncture needle. Using standard technique, the initial micro needle was exchanged over a 0.018 micro wire for a transitional 4 [place] micro sheath. The micro sheath was then exchanged over a 0.035 wire for a 5 French vascular sheath. A C2 cobra catheter was advanced in the abdominal aorta over a Bentson wire. The catheter was used to select the first vessel arising from the ventral aorta. Contrast was injected. This is a left gastric artery arising directly from the aorta. Anatomy is normal. No evidence of active bleeding. The Cobra catheter was then used to select the origin of the celiac artery. Arteriography was performed. The splenic artery is small in caliber. The hepatic artery demonstrates normal hepatic arterial anatomy. The gastroduodenal artery is slightly irregular with spasm in the proximal segment. No evidence of active extravasation. A glidewire was successfully advanced into the common hepatic artery and the C2 cobra catheter was advanced into the common hepatic artery. Additional arteriography was performed in multiple obliquities. Again, the proximal gastroduodenal artery is irregular and spasmodic. No evidence of dissection, aneurysm or active bleeding. A Lantern microcatheter was then advanced over a Fathom 16 wire and used to select the distal aspect of the gastroduodenal artery. Arteriography was performed. The gastroepiploic arteries are widely patent. No evidence of active bleeding. Coil embolization was then performed using a 4 mm Ruby POD detachable micro coil followed by a 45 cm Ruby packing coil. Post embolization arteriography demonstrates complete  occlusion of the vessel with stasis in the proximal segment. The Lantern microcatheter was brought back into the common hepatic artery and arteriography was performed. Again, complete occlusion of the gastroduodenal artery. The right gastric artery is visualized. No evidence of active bleeding from the right gastric artery. The microcatheter was removed. The C2 cobra catheter was used to select the superior mesenteric artery. Arteriography was performed. Patent pancreaticoduodenal arcade supplying the gastroepiploic artery. No evidence of active hemorrhage. The C2 cobra catheter was removed. A limited right common femoral arteriogram was performed confirming common femoral arterial access. Hemostasis was attained with the assistance of a Cordis ExoSeal extra arterial vascular plug. IMPRESSION: 1. No evidence of active hemorrhage. 2. 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 3. Prophylactic coil embolization of the gastroduodenal artery. Signed, Criselda Peaches, MD, [place] [facility] Radiology Electronically Signed   By: Jacqulynn Cadet M.D.   On: 09/07/2018 22:32   Dg Chest Port 1 View  Result Date: 09/09/2018 CLINICAL DATA:  Respiratory failure EXAM: PORTABLE CHEST 1 VIEW COMPARISON:  09/07/2018 FINDINGS: Endotracheal tube and nasogastric catheter have been removed in the interval. Left jugular central line is again noted in the distal superior  vena cava. The cardiac shadow is stable. The lungs are well aerated bilaterally with patchy bibasilar infiltrates and small left pleural effusion. IMPRESSION: Stable bibasilar infiltrates and small left pleural effusion. Electronically Signed   By: Inez Catalina M.D.   On: 09/09/2018 07:17   Dg Chest Port 1 View  Result Date: 09/07/2018 CLINICAL DATA:  Status post fasciotomy and hematoma back UA shins in the right lower leg 2 days ago. Intubated patient. EXAM: PORTABLE CHEST 1 VIEW  COMPARISON:  CT scan of the chest of September 06, 2018 FINDINGS: The lungs are well-expanded. There are patchy increased densities at both bases which are slightly more conspicuous today. There is a small right and and somewhat larger left pleural effusion. The heart and pulmonary vascularity are normal. The endotracheal tube tip projects 4.1 cm above the carina. The esophagogastric tube tip in proximal port project below the GE junction. The left internal jugular venous catheter tip projects over the midportion of the SVC. IMPRESSION: Bibasilar atelectasis or pneumonia. Small right pleural effusion and somewhat larger left pleural effusion. The support tubes are in reasonable position. Electronically Signed   By: David  Martinique M.D.   On: 09/07/2018 13:27   Dg Abd Acute W/chest  Result Date: 08/15/2018 CLINICAL DATA:  Left-sided abdominal pain since July 22, 2018. Weakness and nausea. Vomiting beginning yesterday. Centralized chest pain. EXAM: DG ABDOMEN ACUTE W/ 1V CHEST COMPARISON:  None. FINDINGS: The heart, hila, and mediastinum are normal. No pneumothorax, pulmonary nodule, or infiltrate. No free air, portal venous gas, or pneumatosis. Moderate fecal loading in the colon. No bowel obstruction identified. A rounded calcification to the left of the L4 spinous process is noted. IMPRESSION: 1. Moderate fecal loading throughout the colon. 2. Rounded 2 mm calcification to the left of the L4 spinous process could represent bowel contents. A ureteral stone could not be excluded on this study, however. Recommend clinical correlation. If there is concern for a ureteral stone, CT imaging could better evaluate. Electronically Signed   By: Dorise Bullion III M.D   On: 08/15/2018 19:02   Ct Renal Stone Study  Result Date: 08/22/2018 CLINICAL DATA:  Left flank pain. Nausea vomiting for 4 days. Previous appendectomy. EXAM: CT ABDOMEN AND PELVIS WITHOUT CONTRAST TECHNIQUE: Multidetector CT imaging of the abdomen  and pelvis was performed following the standard protocol without IV contrast. COMPARISON:  CT scan December 19, 2017 FINDINGS: Lower chest: No acute abnormality. Hepatobiliary: No focal liver abnormality is seen. No gallstones, gallbladder wall thickening, or biliary dilatation. Pancreas: Unremarkable. No pancreatic ductal dilatation or surrounding inflammatory changes. Spleen: Normal in size without focal abnormality. Adrenals/Urinary Tract: Adrenal glands are normal. A 3 mm stone is again noted in the mid right kidney. No hydronephrosis or perinephric stranding. Bilateral extrarenal pelvises. No ureterectasis or ureteral stones noted. The bladder is unremarkable. Stomach/Bowel: The stomach is distended. Small bowel is normal. Moderate to severe fecal loading throughout the colon with a stool ball in the rectum. By report, the patient is status post appendectomy. 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 Atherosclerotic changes seen in the aorta, iliac vessels, and femoral vessels. Reproductive: Prostate is unremarkable. Other: No abdominal wall hernia or abnormality. No abdominopelvic ascites. Musculoskeletal: No acute or significant osseous findings. IMPRESSION: 1. Nonobstructive 3 mm stone in the right  kidney. No renal obstruction or other stones noted. 2. Gastric distention of uncertain etiology. 3. Moderate to severe fecal loading throughout the colon. 4. Atherosclerotic changes throughout the abdominal aorta and iliac vessels. 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 Recommend followup by ultrasound in 3 years. This recommendation follows ACR consensus guidelines: White Paper of the ACR  Incidental Findings Committee II on Vascular Findings. Joellyn Rued Radiol 2013; [id number] Electronically Signed   By: Dorise Bullion III M.D   On: 08/22/2018 19:54   Ir Embo Art  [place] Guide Roadmapping  Result Date: 09/07/2018 INDICATION: 56 year old male with large volume upper GI bleed secondary to a very large duodenal ulcer. Patient underwent endoscopic intervention but continues to require pressors and large volume transfusion. He presents for mesenteric arteriography and embolization of any active bleeding and prophylactic embolization of the gastroduodenal artery. EXAM: IR ULTRASOUND GUIDANCE VASC ACCESS RIGHT; ADDITIONAL ARTERIOGRAPHY; IR EMBO ART VEN HEMORR LYMPH EXTRAV INC GUIDE ROADMAPPING; SELECTIVE VISCERAL ARTERIOGRAPHY 1. Ultrasound-guided vascular access 2. Catheterization of the left gastric artery where it arises directly from the aorta with arteriogram 3. Catheterization of the celiac artery with arteriogram 4. Catheterization of the common hepatic artery with arteriogram 5. Catheterization of the gastroduodenal artery with arteriogram 6. Coil embolization of the gastroduodenal artery 7. Catheterization of the superior mesenteric artery with arteriogram MEDICATIONS: None ANESTHESIA/SEDATION: Patient intubated and on propofol drip CONTRAST:  70 mL Isovue 370 FLUOROSCOPY TIME:  Fluoroscopy Time: 11 minutes 30 seconds (487 mGy). COMPLICATIONS: None immediate. PROCEDURE: Informed consent was obtained from the patient following explanation of the procedure, risks, benefits and alternatives. The patient understands, agrees and consents for the procedure. All questions were addressed. A time out was performed prior to the initiation of the procedure. Maximal barrier sterile technique utilized including caps, mask, sterile gowns, sterile gloves, large sterile drape, hand hygiene, and Betadine prep. The right common femoral artery was interrogated with ultrasound and found to  be widely patent. An image was obtained and stored for the medical record. Local anesthesia was attained by infiltration with 1% lidocaine. A small dermatotomy was made. Under real-time sonographic guidance, the vessel was punctured with a 21 gauge micropuncture needle. Using standard technique, the initial micro needle was exchanged over a 0.018 micro wire for a transitional 4 [place] micro sheath. The micro sheath was then exchanged over a 0.035 wire for a 5 French vascular sheath. A C2 cobra catheter was advanced in the abdominal aorta over a Bentson wire. The catheter was used to select the first vessel arising from the ventral aorta. Contrast was injected. This is a left gastric artery arising directly from the aorta. Anatomy is normal. No evidence of active bleeding. The Cobra catheter was then used to select the origin of the celiac artery. Arteriography was performed. The splenic artery is small in caliber. The hepatic artery demonstrates normal hepatic arterial anatomy. The gastroduodenal artery is slightly irregular with spasm in the proximal segment. No evidence of active extravasation. A glidewire was successfully advanced into the common hepatic artery and the C2 cobra catheter was advanced into the common hepatic artery. Additional arteriography was performed in multiple obliquities. Again, the proximal gastroduodenal artery is irregular and spasmodic. No evidence of dissection, aneurysm or active bleeding. A Lantern microcatheter was then advanced over a Fathom 16 wire and used to select the distal aspect of the gastroduodenal  artery. Arteriography was performed. The gastroepiploic arteries are widely patent. No evidence of active bleeding. Coil embolization was then performed using a 4 mm Ruby POD detachable micro coil followed by a 45 cm Ruby packing coil. Post embolization arteriography demonstrates complete occlusion of the vessel with stasis in the proximal segment. The Lantern microcatheter was  brought back into the common hepatic artery and arteriography was performed. Again, complete occlusion of the gastroduodenal artery. The right gastric artery is visualized. No evidence of active bleeding from the right gastric artery. The microcatheter was removed. The C2 cobra catheter was used to select the superior mesenteric artery. Arteriography was performed. Patent pancreaticoduodenal arcade supplying the gastroepiploic artery. No evidence of active hemorrhage. The C2 cobra catheter was removed. A limited right common femoral arteriogram was performed confirming common femoral arterial access. Hemostasis was attained with the assistance of a Cordis ExoSeal extra arterial vascular plug. IMPRESSION: 1. No evidence of active hemorrhage. 2. 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 3. Prophylactic coil embolization of the gastroduodenal artery. Signed, Criselda Peaches, MD, [name] Electronically Signed   By: Jacqulynn Cadet M.D.   On: 09/07/2018 22:32   Ct Angio Abd/pel W/ And/or W/o  Result Date: 09/11/2018 CLINICAL DATA:  Status postoperative right popliteal and tibial embolectomy for embolic disease. Known bilateral iliac artery aneurysmal disease. EXAM: CT ANGIOGRAPHY ABDOMEN AND PELVIS WITH CONTRAST TECHNIQUE: Multidetector CT imaging of the abdomen and pelvis was performed using the standard protocol during bolus administration of intravenous contrast. Multiplanar reconstructed images and MIPs were obtained and reviewed to evaluate the vascular anatomy. CONTRAST:  157m ISOVUE-370 IOPAMIDOL (ISOVUE-370) INJECTION 76% COMPARISON:  CTA of the abdomen on 01/30/2018 and CT of the abdomen and pelvis with contrast on 12/19/2017. FINDINGS: VASCULAR Aorta: Stable mild dilatation of the infrarenal aorta with maximal diameter of 3.1 cm. Celiac: Mild origin stenosis of 25-30%. Distal branches are patent. Status post  transcatheter coil embolization of the gastroduodenal artery on 09/07/2018. Hepatic arteries are normally patent. SMA: Normally patent. Renals: Bilateral single renal arteries demonstrate normal patency. IMA: Normally patent. Inflow: Fusiform aneurysmal disease present of bilateral common iliac arteries as previously demonstrated. Maximal caliber of the right common iliac artery is 2.8-2.9 cm and maximal caliber of the right common iliac artery is 2.7 cm. These measurements are stable since prior studies. The significant change since the prior studies is a much more prominent and new component of mural thrombus in both common iliac arteries, right greater than left. In the proximal right common iliac artery, mural thrombus narrows the aneurysmal lumen by at least 50% in its narrowest portion. Eccentric mural thrombus is present at the origin of the right common iliac artery and also distally just prior to the common iliac bifurcation without significant luminal stenosis. Aneurysmal disease again noted of bilateral internal iliac arteries with the left measuring approximately 2.5 cm in greatest diameter and the right measuring 1.5 cm. Both internal iliac arteries also demonstrate significant mural thrombus without occlusion. Mural thrombus is stable to slightly more prominent compared to the 12/19/2017 study. Bilateral external iliac arteries are normally patent and demonstrate diffuse arteriomegaly with maximum caliber 1.5 cm. Proximal Outflow: Bilateral common femoral arteries demonstrate dilatation with the left measuring up to 2.1 cm in diameter. Mural thrombus is present along the posterior lumen with approximately 50% reduction in luminal diameter. No thrombus is seen extending into proximal SFA or profunda femoral arteries. On the right side the common femoral artery  measures up to 1.8 cm in the femoral bifurcation appears widely patent. Veins: Delayed venous phase imaging shows patent venous structures  without evidence of thrombus. There is some probable mass effect of ectatic external iliac arteries on the distal aspects of bilateral external iliac veins without evidence of associated deep venous thrombosis or significant collateral vein formation. Review of the MIP images confirms the above findings. NON-VASCULAR Lower chest: Small bilateral pleural effusions with bibasilar atelectasis. Hepatobiliary: No focal liver abnormality is seen. No gallstones, gallbladder wall thickening, or biliary dilatation. Pancreas: Since the March study, there is a change in appearance of the pancreas on the venous phase of imaging. The tail of the pancreas now appears progressively atrophic with associated pancreatic ductal dilatation up to approximately 7 mm. At the junction of the body and head of the pancreas, there is suggestion a potential ill-defined low-attenuation mass that could measure as much as 3 cm in AP diameter. Borders are very difficult to delineate by CT. There is no associated biliary obstruction. 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 Recommend eventual correlation with MRI of the abdomen with and without gadolinium. There may be a window of time recommended to wait to perform MRI after placement of the gastroduodenal artery Ruby coils. This can be further investigated based on manufacturer recommendation. If there may be a significant delay in ability to perform MRI, consider endoscopic ultrasound to investigate the pancreas. Spleen: Normal in size without focal abnormality. Adrenals/Urinary Tract: Adrenal glands are unremarkable. Kidneys are normal, without renal calculi, focal lesion, or hydronephrosis. Bladder is unremarkable. Stomach/Bowel: Gastric fold thickening and prominent fold thickening of the duodenum may be consistent with known peptic ulcer disease. No evidence of bowel obstruction or free intraperitoneal air. No focal abscess identified. Lymphatic: No enlarged lymph nodes identified.  Reproductive: Prostate is unremarkable. Other: Diffuse anasarca of the body wall and edema of the mesenteric and retroperitoneal fat likely relate to low albumin and malnutrition. Musculoskeletal: Moderate degenerative disc disease at L5-S1. IMPRESSION: VASCULAR 1. 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 No complications evident after embolization. 2. Stable mild aneurysmal disease of the distal abdominal aorta measuring 3.1 cm. 3. 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 The major change since prior imaging is significant increase in mural thrombus within the dilated common iliac arteries bilaterally, especially on the right. This is most likely the source distal emboli to the right distal leg. Mural thrombus in the internal iliac artery aneurysms is stable to slightly more prominent. 4. Stable diffuse enlargement bilateral external iliac and common femoral arteries. The left common femoral artery does contain significant posterior wall mural thrombus. NON-VASCULAR 1. The most significant nonvascular finding is potential development of a mass of the pancreas at the juncture of the head and body causing progressive atrophy of the tail of the pancreas with associated pancreatic ductal dilatation. On the venous phase of imaging, delineation of the potential mass is quite vague but the region of mass may measure as much as 3 cm. There may be some issue with immediate performance of MRI in the setting recent coiling of the gastroduodenal artery even if the coils are MRI compatible. This can be checked with the manufacturer recommendation. If MRI can be performed immediately, an MRI of the abdomen with and without gadolinium would be helpful for further evaluation. If there is going to be significant delay, consider EUS characterization of the pancreas. 2. Diffuse body wall anasarca and edema of the mesenteric fat and  retroperitoneum likely  reflects low albumin and malnutrition. 3. Prominent gastric folds and duodenal folds without evidence of bowel obstruction or perforation. Electronically Signed   By: Aletta Edouard M.D.   On: 09/11/2018 12:36     LOS: 10 days   Signature  Lala Lund M.D on 09/13/2018 at 9:56 AM   -  To page go to [email or web address] - password [email or web address]

## 2018-09-13 NOTE — Progress Notes (Signed)
Physical Therapy Treatment Patient Details Name: Ryan Medina MRN: [id number] DOB: 1961-05-13 Today's Date: 09/13/2018    History of Present Illness Patient is a 56 y.o. male with PMH including but not limited to DM and HTN who presented to the hospital on 12/13 with a right ischemic leg secondary to acute popliteal and tibial embolus, underwent embolectomy on 12/13 and subsequently was placed on a heparin drip.  He unfortunately developed a acute right lower leg hematoma with compartment syndrome requiring fasciotomy on 12/15.  Further hospital course was complicated by development of hemorrhagic shock with acute blood loss anemia secondary to a bleeding duodenal ulcer-requiring ICU transfer-intubation for airway protection and IR evaluation with mesenteric angiogram and GDA embolization.  Upon stability he was transferred back to the [facility] hospital[facility] on 12/19. Pt is now s/p closure of R LE fasciotomy on 09/10/18.    PT Comments    Patient seen to attempt mobility progression. Patient does well with bed mobility with handrails and increased time/effort. However, with dangle of R LE pain increases. Min A +2 for sit to stand, Mod A +2 for stand pivot to recliner with patient not attempting to bear weight through R LE due to pain. Education to increase dangle time during day to improve tolerance to gravity dependent positions. Will continue to follow.     Follow Up Recommendations  CIR     Equipment Recommendations  Rolling walker with 5" wheels;3in1 (PT)    Recommendations for Other Services Rehab consult     Precautions / Restrictions Precautions Precautions: Fall Restrictions Weight Bearing Restrictions: Yes RLE Weight Bearing: Weight bearing as tolerated    Mobility  Bed Mobility Overal bed mobility: Needs Assistance Bed Mobility: Supine to Sit;Sit to Supine     Supine to sit: Min guard Sit to supine: Min guard   General bed mobility comments: increased time and  effort, min guard for safety  Transfers Overall transfer level: Needs assistance Equipment used: Rolling walker (2 wheeled) Transfers: Sit to/from [facility] Sit to Stand: Min assist;+2 physical assistance;From elevated surface Stand pivot transfers: Mod assist;+2 physical assistance       General transfer comment: Min A to power up into standing. Mod A +2 for balance and management of RW during pivot to recliner. Pt with increased time and requires increased cues for sequencing of transfer.   Ambulation/Gait             General Gait Details: unable to progress due to pain   Stairs             Wheelchair Mobility    Modified Rankin (Stroke Patients Only)       Balance Overall balance assessment: Needs assistance Sitting-balance support: Feet supported Sitting balance-Leahy Scale: Fair     Standing balance support: Bilateral upper extremity supported Standing balance-Leahy Scale: Poor Standing balance comment: reliant on bilateral UEs on RW                            Cognition Arousal/Alertness: Lethargic Behavior During Therapy: WFL for tasks assessed/performed Overall Cognitive Status: Within Functional Limits for tasks assessed                                 General Comments: Seems more lethargic this session and states he feels "tired and didnt sleep well"      Exercises  General Comments General comments (skin integrity, edema, etc.): Discussed with patient regarding allowing R LE to dangle during day to increase tolerance to gravity dependent position of R LE; Discussed with RN regarding seemingly swollen peri area      Pertinent Vitals/Pain Pain Assessment: 0-10 Pain Score: 8  Pain Location: R LE Pain Descriptors / Indicators: Sore;Guarding;Grimacing;Shooting Pain Intervention(s): Limited activity within patient's tolerance;Monitored during session;Repositioned;Patient requesting pain meds-RN  notified    Home Living                      Prior Function            PT Goals (current goals can now be found in the care plan section) Acute Rehab PT Goals Patient Stated Goal: decrease pain PT Goal Formulation: With patient Time For Goal Achievement: 09/25/18 Potential to Achieve Goals: Good Progress towards PT goals: Not progressing toward goals - comment(pain limiting)    Frequency    Min 3X/week      PT Plan Current plan remains appropriate    Co-evaluation   Reason for Co-Treatment: For patient/therapist safety;To address functional/ADL transfers PT goals addressed during session: Mobility/safety with mobility;Balance;Proper use of DME OT goals addressed during session: ADL's and self-care      AM-PAC PT "6 Clicks" Mobility   Outcome Measure  Help needed turning from your back to your side while in a flat bed without using bedrails?: None Help needed moving from lying on your back to sitting on the side of a flat bed without using bedrails?: A Little Help needed moving to and from a bed to a chair (including a wheelchair)?: A Lot Help needed standing up from a chair using your arms (e.g., wheelchair or bedside chair)?: A Lot Help needed to walk in hospital room?: Total Help needed climbing 3-5 steps with a railing? : Total 6 Click Score: 13    End of Session Equipment Utilized During Treatment: Gait belt Activity Tolerance: Patient limited by pain Patient left: in chair;with call bell/phone within reach;with chair alarm set Nurse Communication: Mobility status PT Visit Diagnosis: Other abnormalities of gait and mobility (R26.89);Pain Pain - Right/Left: Right Pain - part of body: Leg     Time: 3151-7616 PT Time Calculation (min) (ACUTE ONLY): 23 min  Charges:  $Therapeutic Activity: 8-22 mins                     Lanney Gins, PT, DPT Supplemental Physical Therapist 09/13/18 9:48 AM Pager: [phone] Office: [phone]

## 2018-09-13 NOTE — Progress Notes (Addendum)
Attending physician note 09/13/18 4:55pm: After review of the medical record in preparation for the patient's TEE, he has recently had a hemodynamically significant upper GI bleed requiring GDA embolization. There was an adherent clot that was not removed. GI procedure note suggest not passing an NG tube for several days due to severe esophagitis. For these reasons, I believe TEE is contraindicated until repeat scope on Thursday with GI with visualization. The TEE procedure will tentatively be rescheduled for that time, to follow GI scope if safe from a GI perspective.  -------------------------------------------------------------------------     [facility] has been requested to perform a transesophageal echocardiogram on 09/14/18 for evaluation of LE arterial emboli.  After careful review of history and examination, the risks and benefits of transesophageal echocardiogram have been explained including risks of esophageal damage, perforation (1:10,000 risk), bleeding, pharyngeal hematoma as well as other potential complications associated with conscious sedation including aspiration, arrhythmia, respiratory failure and death. Alternatives to treatment were discussed, questions were answered. Patient is willing to proceed.   TEE scheduled for 09/14/18 at 12:00pm with Dr. Margaretann Loveless.  Roby Lofts, PA-C 09/13/2018 2:46 PM

## 2018-09-13 NOTE — Progress Notes (Signed)
[facility] Progress Note  CC:  Pancreatic mass; DU  Subjective:  Called back to see patient for pancreatic mass seen on imaging.  MRI showed the following:  IMPRESSION: 1. Moderate motion and susceptibility artifact degradation throughout. 2. Confirmation of a pancreatic neck mass, most consistent with adenocarcinoma. Involvement of the celiac and its branches, most consistent with a non-operative lesion. 3. Consider endoscopic ultrasound sampling. 4. Gastric wall and fold thickening, suggesting gastritis. 5. Small bilateral pleural effusions with adjacent airspace disease.  In regards to his bleeding from the DU there has been no active issues even with restart of IV heparin.  Hgb has been stable.  Objective:  Vital signs in last 24 hours: Temp:  [98.4 F (36.9 C)-98.8 F (37.1 C)] 98.6 F (37 C) (12/23 0418) Pulse Rate:  [76-87] 87 (12/23 0418) Resp:  [18] 18 (12/23 0418) BP: (117-137)/(88-93) 137/93 (12/23 0418) SpO2:  [98 %-100 %] 100 % (12/23 0418) Last BM Date: 09/12/18 General:  Alert, Well-developed, in NAD Heart:  Regular rate and rhythm; no murmurs Pulm:  CTAB.  No increased WOB. Abdomen:  Soft, non-distended.  BS present.  Non-tender. Extremities:  Without edema.  RLQ with staples closing fasciotomy looks good. Neurologic:  Alert and oriented x 4;  grossly normal neurologically. Psych:  Alert and cooperative. Normal mood and affect.  Intake/Output from previous day: 12/22 0701 - 12/23 0700 In: 98.4 [I.V.:98.4] Out: 1302 [Urine:1300; Stool:2]  Lab Results: Recent Labs    09/12/18 1322 09/12/18 1928 09/12/18 2318 09/13/18 0352  WBC 7.2 7.5  --  7.9  HGB 8.5* 8.7* 8.7* 8.5*  HCT 25.5* 25.8* 26.2* 24.9*  PLT 213 240  --  246   BMET Recent Labs    09/11/18 0410 09/12/18 0415 09/13/18 0352  NA 133* 135 138  K 3.6 3.4* 3.4*  CL 100 104 100  CO2 23 23 26   GLUCOSE 294* 297* 254*  BUN 11 10 6   CREATININE 0.60* 0.75 0.49*    CALCIUM 7.0* 7.1* 7.4*   LFT Recent Labs    09/13/18 0352  PROT 4.2*  ALBUMIN 1.4*  AST 26  ALT 19  ALKPHOS 53  BILITOT 0.6   Mr Abdomen W Wo Contrast  Result Date: 09/12/2018 CLINICAL DATA:  Suspicion of pancreatic neck mass on CT. EXAM: MRI ABDOMEN WITHOUT AND WITH CONTRAST TECHNIQUE: Multiplanar multisequence MR imaging of the abdomen was performed both before and after the administration of intravenous contrast. CONTRAST:  6 cc Gadavist COMPARISON:  CT of 1 day prior. Stone study of 08/22/2018. More remote CT of 12/19/2017. FINDINGS: Moderatemultifactorial degradation, including respiratory motion and susceptibility artifact from embolization coils. Lower chest: Bilateral pleural fluid with bibasilar airspace disease. Normal heart size. Hepatobiliary: Left hepatic lobe 9 mm cyst. Too small to characterizea right hepatic lobe lesion is most likely a cyst at 2 mm. Normal gallbladder, without biliary ductal dilatation. Pancreas: An area of soft tissue fullness and restricted diffusion is identified within the pancreatic neck, corresponding to the abnormality on CT. This measures on the order of 3.9 x 3.6 cm on image 76/12. Also 3.6 x 3.6 cm on image 35/31. Results in pancreatic duct dilatation within the upstream body and tail on image 17/11. Involves the celiac and its branches including on image 75/12. Spleen:  Grossly within normal limits. Adrenals/Urinary Tract: Normal adrenal glands. Grossly normal kidneys, without hydronephrosis. Stomach/Bowel: Gastric distension with proximal wall and fold thickening, including on image 73/12. Grossly normal abdominal bowel loops. Vascular/Lymphatic: Normal  caliber of the aorta. 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 Limited evaluation for abdominal adenopathy. Other:  Small volume abdominal ascites. Musculoskeletal: No acute osseous abnormality. IMPRESSION: 1. Moderate motion and susceptibility artifact degradation throughout. 2.  Confirmation of a pancreatic neck mass, most consistent with adenocarcinoma. Involvement of the celiac and its branches, most consistent with a non-operative lesion. 3. Consider endoscopic ultrasound sampling. 4. Gastric wall and fold thickening, suggesting gastritis. 5. Small bilateral pleural effusions with adjacent airspace disease. Electronically Signed   By: Abigail Miyamoto M.D.   On: 09/12/2018 03:04   Ct Angio Abd/pel W/ And/or W/o  Result Date: 09/11/2018 CLINICAL DATA:  Status postoperative right popliteal and tibial embolectomy for embolic disease. Known bilateral iliac artery aneurysmal disease. EXAM: CT ANGIOGRAPHY ABDOMEN AND PELVIS WITH CONTRAST TECHNIQUE: Multidetector CT imaging of the abdomen and pelvis was performed using the standard protocol during bolus administration of intravenous contrast. Multiplanar reconstructed images and MIPs were obtained and reviewed to evaluate the vascular anatomy. CONTRAST:  131mL ISOVUE-370 IOPAMIDOL (ISOVUE-370) INJECTION 76% COMPARISON:  CTA of the abdomen on 01/30/2018 and CT of the abdomen and pelvis with contrast on 12/19/2017. FINDINGS: VASCULAR Aorta: Stable mild dilatation of the infrarenal aorta with maximal diameter of 3.1 cm. Celiac: Mild origin stenosis of 25-30%. Distal branches are patent. Status post transcatheter coil embolization of the gastroduodenal artery on 09/07/2018. Hepatic arteries are normally patent. SMA: Normally patent. Renals: Bilateral single renal arteries demonstrate normal patency. IMA: Normally patent. Inflow: Fusiform aneurysmal disease present of bilateral common iliac arteries as previously demonstrated. Maximal caliber of the right common iliac artery is 2.8-2.9 cm and maximal caliber of the right common iliac artery is 2.7 cm. These measurements are stable since prior studies. The significant change since the prior studies is a much more prominent and new component of mural thrombus in both common iliac arteries, right  greater than left. In the proximal right common iliac artery, mural thrombus narrows the aneurysmal lumen by at least 50% in its narrowest portion. Eccentric mural thrombus is present at the origin of the right common iliac artery and also distally just prior to the common iliac bifurcation without significant luminal stenosis. Aneurysmal disease again noted of bilateral internal iliac arteries with the left measuring approximately 2.5 cm in greatest diameter and the right measuring 1.5 cm. Both internal iliac arteries also demonstrate significant mural thrombus without occlusion. Mural thrombus is stable to slightly more prominent compared to the 12/19/2017 study. Bilateral external iliac arteries are normally patent and demonstrate diffuse arteriomegaly with maximum caliber 1.5 cm. Proximal Outflow: Bilateral common femoral arteries demonstrate dilatation with the left measuring up to 2.1 cm in diameter. Mural thrombus is present along the posterior lumen with approximately 50% reduction in luminal diameter. No thrombus is seen extending into proximal SFA or profunda femoral arteries. On the right side the common femoral artery measures up to 1.8 cm in the femoral bifurcation appears widely patent. Veins: Delayed venous phase imaging shows patent venous structures without evidence of thrombus. There is some probable mass effect of ectatic external iliac arteries on the distal aspects of bilateral external iliac veins without evidence of associated deep venous thrombosis or significant collateral vein formation. Review of the MIP images confirms the above findings. NON-VASCULAR Lower chest: Small bilateral pleural effusions with bibasilar atelectasis. Hepatobiliary: No focal liver abnormality is seen. No gallstones, gallbladder wall thickening, or biliary dilatation. Pancreas: Since the March study, there is a change in appearance of the pancreas on the venous  phase of imaging. The tail of the pancreas now appears  progressively atrophic with associated pancreatic ductal dilatation up to approximately 7 mm. At the junction of the body and head of the pancreas, there is suggestion a potential ill-defined low-attenuation mass that could measure as much as 3 cm in AP diameter. Borders are very difficult to delineate by CT. There is no associated biliary obstruction. 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 Recommend eventual correlation with MRI of the abdomen with and without gadolinium. There may be a window of time recommended to wait to perform MRI after placement of the gastroduodenal artery Ruby coils. This can be further investigated based on manufacturer recommendation. If there may be a significant delay in ability to perform MRI, consider endoscopic ultrasound to investigate the pancreas. Spleen: Normal in size without focal abnormality. Adrenals/Urinary Tract: Adrenal glands are unremarkable. Kidneys are normal, without renal calculi, focal lesion, or hydronephrosis. Bladder is unremarkable. Stomach/Bowel: Gastric fold thickening and prominent fold thickening of the duodenum may be consistent with known peptic ulcer disease. No evidence of bowel obstruction or free intraperitoneal air. No focal abscess identified. Lymphatic: No enlarged lymph nodes identified. Reproductive: Prostate is unremarkable. Other: Diffuse anasarca of the body wall and edema of the mesenteric and retroperitoneal fat likely relate to low albumin and malnutrition. Musculoskeletal: Moderate degenerative disc disease at L5-S1. IMPRESSION: VASCULAR 1. 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 No complications evident after embolization. 2. Stable mild aneurysmal disease of the distal abdominal aorta measuring 3.1 cm. 3. 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 The major change since prior imaging is significant increase in mural thrombus within the dilated common  iliac arteries bilaterally, especially on the right. This is most likely the source distal emboli to the right distal leg. Mural thrombus in the internal iliac artery aneurysms is stable to slightly more prominent. 4. Stable diffuse enlargement bilateral external iliac and common femoral arteries. The left common femoral artery does contain significant posterior wall mural thrombus. NON-VASCULAR 1. The most significant nonvascular finding is potential development of a mass of the pancreas at the juncture of the head and body causing progressive atrophy of the tail of the pancreas with associated pancreatic ductal dilatation. On the venous phase of imaging, delineation of the potential mass is quite vague but the region of mass may measure as much as 3 cm. There may be some issue with immediate performance of MRI in the setting recent coiling of the gastroduodenal artery even if the coils are MRI compatible. This can be checked with the manufacturer recommendation. If MRI can be performed immediately, an MRI of the abdomen with and without gadolinium would be helpful for further evaluation. If there is going to be significant delay, consider EUS characterization of the pancreas. 2. Diffuse body wall anasarca and edema of the mesenteric fat and retroperitoneum likely reflects low albumin and malnutrition. 3. Prominent gastric folds and duodenal folds without evidence of bowel obstruction or perforation. Electronically Signed   By: Aletta Edouard M.D.   On: 09/11/2018 12:36   Assessment / Plan: *  Pancreatic mass:  MRI shows most c/w adenocarcinoma.  Will likely need EUS with FNA.  Planning tentatively for 12/26 with Dr. Ardis Hughs.  CA 19-9 pending.  Likely the cause of his hypercoagulability.   *Acute UGI hemorrhage in setting of IV Heparin.  EGD 09/08/18: severe esophagitis, duodenal bulb ulcer and overlying clot treated with hemospray.  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  Currently on PPI PO BID.  No  further sign of active bleeding even with restart of IV heparin.   * Blood loss anemia. S/p PRBCs x10, FFP x 1, platelets x 1 (last 2 on 09/10/18).  Hgb has been holding stable.     * S/p Right LE thrombolectomy and fasciotomy then closure on 12/20.  On IV heparin without any significant signs of re-bleeding.  **Plan for EUS on 12/26 at 1:30 pm.  Discussed with pharmacist and will hold heparin starting at 7-730AM on Thursday morning.   LOS: 10 days   Ryan Medina. Ryan Medina  09/13/2018, 10:21 AM

## 2018-09-13 NOTE — Progress Notes (Signed)
[facility] for Heparin Indication: Arterial embolism  Allergies  Allergen Reactions  . Lisinopril Anaphylaxis    Patient Measurements: Height: 5\' 11"  (180.3 cm) Weight: 147 lb 0.8 oz (66.7 kg) IBW/kg (Calculated) : 75.3 Heparin Dosing Weight: 66.7  Vital Signs: Temp: 98.6 F (37 C) (12/23 0418) Temp Source: Oral (12/23 0418) BP: 137/93 (12/23 0418) Pulse Rate: 87 (12/23 0418)  Labs: Recent Labs    09/11/18 0410 09/11/18 1624 09/12/18 0415  09/12/18 1322 09/12/18 1928 09/12/18 2318 09/13/18 0352 09/13/18 0918  HGB 10.0*  --  8.6*   < > 8.5* 8.7* 8.7* 8.5*  --   HCT 29.3*  --  25.9*   < > 25.5* 25.8* 26.2* 24.9*  --   PLT 208  --  210   < > 213 240  --  246  --   APTT  --  30 30  --   --   --   --   --   --   HEPARINUNFRC  --   --  <0.10*  --   --  <0.10*  --   --  <0.10*  CREATININE 0.60*  --  0.75  --   --   --   --  0.49*  --    < > = values in this interval not displayed.    Estimated Creatinine Clearance: 96.1 mL/min (A) (by C-G formula based on SCr of 0.49 mg/dL (L)).   Assessment: 57 yo male with artial embolus s/p embolectomy. Patient had development of hematoma with compartment syndrome as well as hemorrhagic shock secondary to a bleeding duodenal ulcer. Plan is for EUS with FNA on 12/26 to assess pancreatic mass.  Heparin level is <0.1 on 750 units/hr. No active bleeding noted, Hgb low stable at 8.5, platelets are normal. Dr Carlis Abbott ok with cautious heparin rate increase as long as patient able to tolerate.  Goal of Therapy:  Heparin level 0.3-0.5 units/mL Monitor platelets by anticoagulation protocol: Yes   Plan:  Increase heparin drip to 900 units/hr 6 hr heparin level Daily heparin level and CBC Close monitoring of Hgb Heparin drip scheduled to stop on 12/26 at 07:00 per discussion with GI   Renold Genta, PharmD, BCPS Clinical Pharmacist Clinical phone for 09/13/2018 until 3p is [phone] 09/13/2018 11:44  AM  **Pharmacist phone directory can now be found on [email or web address] listed under [facility]**

## 2018-09-13 NOTE — Progress Notes (Signed)
Inpatient Diabetes Program Recommendations  AACE/ADA: New Consensus Statement on Inpatient Glycemic Control (2015)  Target Ranges:  Prepandial:   less than 140 mg/dL      Peak postprandial:   less than 180 mg/dL (1-2 hours)      Critically ill patients:  140 - 180 mg/dL   Lab Results  Component Value Date   GLUCAP 226 (H) 09/13/2018   HGBA1C 18.2 (H) 08/23/2018    Review of Glycemic Control Results for Ryan Medina, Ryan Medina (MRN [id number]) as of 09/13/2018 09:41  Ref. Range 09/11/2018 20:48 09/12/2018 12:05 09/12/2018 17:00 09/12/2018 21:41 09/13/2018 07:57  Glucose-Capillary Latest Ref Range: 70 - 99 mg/dL 212 (H) 309 (H) 293 (H) 127 (H) 226 (H)   Inpatient Diabetes Program Recommendations:  -Restart Levemir 7 units daily -Change Novolog correction to q 4 hrs. While NPO.  Thank you, Nani Gasser. Jerzie Bieri, RN, MSN, CDE  Diabetes Coordinator Inpatient Glycemic Control Team Team Pager [phone] (8am-5pm) 09/13/2018 9:43 AM

## 2018-09-13 NOTE — Progress Notes (Signed)
Occupational Therapy Treatment Patient Details Name: Ryan Medina MRN: [id number] DOB: Oct 01, 1960 Today's Date: 09/13/2018    History of present illness Patient is a 57 y.o. male with PMH including but not limited to DM and HTN who presented to the hospital on 12/13 with a right ischemic leg secondary to acute popliteal and tibial embolus, underwent embolectomy on 12/13 and subsequently was placed on a heparin drip.  He unfortunately developed a acute right lower leg hematoma with compartment syndrome requiring fasciotomy on 12/15.  Further hospital course was complicated by development of hemorrhagic shock with acute blood loss anemia secondary to a bleeding duodenal ulcer-requiring ICU transfer-intubation for airway protection and IR evaluation with mesenteric angiogram and GDA embolization.  Upon stability he was transferred back to the triad hospitalist service on 12/19. Pt is now s/p closure of R LE fasciotomy on 09/10/18.   OT comments  Upon arrival, pt supine in bed with RLE elevated. Pt reporting that he performed stand pivot with staff yesterday for BM. Pt performing simulated toilet transfer to recliner with Mod A +2 and RW. Pt continues to present with increased pain at RLE when leg is positioned downward (especially in standing). Pt performing transfer while keeping RLE off floor. Discussed with pt and RN that pt should daggle his leg during the day for positional changes and increased tolerance. Continue to recommend dc to CIR for further OT and PT and will continue to follow acutely as admitted.    Follow Up Recommendations  CIR;Supervision/Assistance - 24 hour    Equipment Recommendations  Other (comment)(Defer to next venue)    Recommendations for Other Services PT consult    Precautions / Restrictions Precautions Precautions: Fall Restrictions Weight Bearing Restrictions: Yes RLE Weight Bearing: Weight bearing as tolerated       Mobility Bed Mobility Overal bed  mobility: Needs Assistance Bed Mobility: Supine to Sit;Sit to Supine     Supine to sit: Min guard Sit to supine: Min guard   General bed mobility comments: increased time and effort, min guard for safety  Transfers Overall transfer level: Needs assistance Equipment used: Rolling walker (2 wheeled) Transfers: Sit to/from [facility] Sit to Stand: Min assist;+2 physical assistance;From elevated surface Stand pivot transfers: Mod assist;+2 physical assistance       General transfer comment: Min A to power up into standing. Mod A +2 for balance and management of RW during pivot to recliner. Pt with increased time and requires increased cues for sequencing of transfer.     Balance Overall balance assessment: Needs assistance Sitting-balance support: Feet supported Sitting balance-Leahy Scale: Fair     Standing balance support: Bilateral upper extremity supported Standing balance-Leahy Scale: Poor Standing balance comment: reliant on bilateral UEs on RW                           ADL either performed or assessed with clinical judgement   ADL Overall ADL's : Needs assistance/impaired Eating/Feeding: Independent;Sitting               Upper Body Dressing : Minimal assistance;Sitting Upper Body Dressing Details (indicate cue type and reason): donned second gown like a jacket Lower Body Dressing: Moderate assistance;+2 for physical assistance;Sit to/from stand;Minimal assistance Lower Body Dressing Details (indicate cue type and reason): Requiring Min A to initating donning of socks due to increased pain Toilet Transfer: +2 for physical assistance;Stand-pivot;Minimal assistance;RW(simulated to recliner) Toilet Transfer Details (indicate cue type and reason): Mod  A +2 for power up into standing and then maintain balance duirng pivot to recliner         Functional mobility during ADLs: Moderate assistance;+2 for physical assistance General ADL  Comments: Pt continues to present with increased time when RLE daggles in sitting and standing.      Vision       Perception     Praxis      Cognition Arousal/Alertness: Lethargic Behavior During Therapy: WFL for tasks assessed/performed Overall Cognitive Status: Within Functional Limits for tasks assessed                                 General Comments: Seems more lethargic this session and states he feels "tired and didnt sleep well"        Exercises     Shoulder Instructions       General Comments Discussed with RN having pt daggle his leg at EOB mutiple times during the day to increase tolerance. Also notified RN that pt has increased edema at peri area    Pertinent Vitals/ Pain       Pain Assessment: 0-10 Pain Score: 8  Pain Location: R LE Pain Descriptors / Indicators: Sore;Guarding;Grimacing;Shooting Pain Intervention(s): Monitored during session;Limited activity within patient's tolerance;Repositioned  Home Living                                          Prior Functioning/Environment              Frequency  Min 2X/week        Progress Toward Goals  OT Goals(current goals can now be found in the care plan section)  Progress towards OT goals: Not progressing toward goals - comment(limited by pain)  Acute Rehab OT Goals Patient Stated Goal: decrease pain OT Goal Formulation: With patient Time For Goal Achievement: 09/25/18 Potential to Achieve Goals: Good ADL Goals Pt Will Perform Grooming: with min assist;standing Pt Will Perform Lower Body Dressing: with min guard assist;sit to/from stand Pt Will Transfer to Toilet: with min guard assist;bedside commode;ambulating Pt Will Perform Toileting - Clothing Manipulation and hygiene: with min guard assist;sit to/from stand;sitting/lateral leans  Plan Discharge plan remains appropriate    Co-evaluation    PT/OT/SLP Co-Evaluation/Treatment: Yes Reason for  Co-Treatment: For patient/therapist safety;To address functional/ADL transfers   OT goals addressed during session: ADL's and self-care      AM-PAC OT "6 Clicks" Daily Activity     Outcome Measure   Help from another person eating meals?: None Help from another person taking care of personal grooming?: A Little Help from another person toileting, which includes using toliet, bedpan, or urinal?: A Lot Help from another person bathing (including washing, rinsing, drying)?: A Lot Help from another person to put on and taking off regular upper body clothing?: A Little Help from another person to put on and taking off regular lower body clothing?: A Lot 6 Click Score: 16    End of Session Equipment Utilized During Treatment: Gait belt;Rolling walker  OT Visit Diagnosis: Unsteadiness on feet (R26.81);Other abnormalities of gait and mobility (R26.89);Muscle weakness (generalized) (M62.81);Pain Pain - Right/Left: Right Pain - part of body: Leg   Activity Tolerance Patient tolerated treatment well;Patient limited by pain   Patient Left with call bell/phone within reach;in chair;with chair alarm set;with nursing/sitter in room  Nurse Communication Mobility status        Time: 6270-3500 OT Time Calculation (min): 23 min  Charges: OT General Charges $OT Visit: 1 Visit OT Treatments $Self Care/Home Management : 8-22 mins  Gravity, OTR/L Acute Rehab Pager: [phone] Office: [place] 09/13/2018, 9:42 AM

## 2018-09-14 ENCOUNTER — Other Ambulatory Visit (HOSPITAL_COMMUNITY): Payer: Self-pay

## 2018-09-14 ENCOUNTER — Inpatient Hospital Stay (HOSPITAL_BASED_OUTPATIENT_CLINIC_OR_DEPARTMENT_OTHER): Payer: Medicaid Other

## 2018-09-14 ENCOUNTER — Encounter (HOSPITAL_COMMUNITY)
Admission: EM | Disposition: A | Discharge status: Skilled Nursing Facility | Payer: Self-pay | Source: Home / Self Care | Attending: Internal Medicine

## 2018-09-14 DIAGNOSIS — I714 Abdominal aortic aneurysm, without rupture: Secondary | ICD-10-CM

## 2018-09-14 LAB — CBC
HCT: 25.8 % — ABNORMAL LOW (ref 39.0–52.0)
Hemoglobin: 8.6 g/dL — ABNORMAL LOW (ref 13.0–17.0)
MCH: 27.8 pg (ref 26.0–34.0)
MCHC: 33.3 g/dL (ref 30.0–36.0)
MCV: 83.5 fL (ref 80.0–100.0)
Platelets: 290 10*3/uL (ref 150–400)
RBC: 3.09 MIL/uL — ABNORMAL LOW (ref 4.22–5.81)
RDW: 13.9 % (ref 11.5–15.5)
WBC: 7.9 10*3/uL (ref 4.0–10.5)
nRBC: 0 % (ref 0.0–0.2)

## 2018-09-14 LAB — COMPREHENSIVE METABOLIC PANEL
ALT: 23 U/L (ref 0–44)
AST: 32 U/L (ref 15–41)
Albumin: 1.6 g/dL — ABNORMAL LOW (ref 3.5–5.0)
Alkaline Phosphatase: 59 U/L (ref 38–126)
Anion gap: 12 (ref 5–15)
BUN: <5 mg/dL — ABNORMAL LOW (ref 6–20)
CO2: 27 mmol/L (ref 22–32)
Calcium: 7.6 mg/dL — ABNORMAL LOW (ref 8.9–10.3)
Chloride: 95 mmol/L — ABNORMAL LOW (ref 98–111)
Creatinine, Ser: 0.62 mg/dL (ref 0.61–1.24)
GFR calc Af Amer: >60 mL/min (ref >60)
GFR calc non Af Amer: >60 mL/min (ref >60)
GLUCOSE: 263 mg/dL — AB (ref 70–99)
Potassium: 3.6 mmol/L (ref 3.5–5.1)
Sodium: 134 mmol/L — ABNORMAL LOW (ref 135–145)
Total Bilirubin: 0.8 mg/dL (ref 0.3–1.2)
Total Protein: 4.8 g/dL — ABNORMAL LOW (ref 6.5–8.1)

## 2018-09-14 LAB — HEPARIN LEVEL (UNFRACTIONATED)
Heparin Unfractionated: 0.21 IU/mL — ABNORMAL LOW (ref 0.30–0.70)
Heparin Unfractionated: 0.35 IU/mL (ref 0.30–0.70)
Heparin Unfractionated: 0.4 IU/mL (ref 0.30–0.70)

## 2018-09-14 LAB — GLUCOSE, CAPILLARY
GLUCOSE-CAPILLARY: 203 mg/dL — AB (ref 70–99)
GLUCOSE-CAPILLARY: 214 mg/dL — AB (ref 70–99)
Glucose-Capillary: 232 mg/dL — ABNORMAL HIGH (ref 70–99)
Glucose-Capillary: 172 mg/dL — ABNORMAL HIGH (ref 70–99)

## 2018-09-14 SURGERY — ECHOCARDIOGRAM, TRANSESOPHAGEAL
Anesthesia: Monitor Anesthesia Care

## 2018-09-14 MED ORDER — INSULIN GLARGINE 100 UNIT/ML ~~LOC~~ SOLN
10.0000 [IU] | Freq: Every day | SUBCUTANEOUS | Status: DC
  Administered 2018-09-14 – 2018-09-18 (×5): 10 [IU] via SUBCUTANEOUS
  Filled 2018-09-14 (×5): qty 0.1

## 2018-09-14 MED ORDER — FAMOTIDINE IN NACL 20-0.9 MG/50ML-% IV SOLN
20.0000 mg | Freq: Once | INTRAVENOUS | Status: AC
  Administered 2018-09-14: 20 mg via INTRAVENOUS
  Filled 2018-09-14: qty 50

## 2018-09-14 MED ORDER — ALUM & MAG HYDROXIDE-SIMETH 200-200-20 MG/5ML PO SUSP
30.0000 mL | Freq: Two times a day (BID) | ORAL | Status: DC | PRN
  Administered 2018-09-14 – 2018-09-25 (×4): 30 mL via ORAL
  Filled 2018-09-14 (×5): qty 30

## 2018-09-14 NOTE — Progress Notes (Signed)
Pt complained of severe stomach acid, and nausea throughout the night. PRN Zofran administer per orders. Pt stated, " prescribed Mylanta and IV Pepcid is not effective and not helping", MD notified. RN waiting for new orders.

## 2018-09-14 NOTE — Progress Notes (Addendum)
  Progress Note    09/14/2018 8:34 AM 4 Days Post-Op  Subjective:  Mild nose bleed last night; patient states he is still having dark stools   Vitals:   09/13/18 2231 09/14/18 0513  BP: (!) 141/100 (!) 131/94  Pulse: 85 97  Resp: 17 18  Temp: 99.4 F (37.4 C) 99.9 F (37.7 C)  SpO2: 99% 100%   Physical Exam: Lungs:  Non labored Incisions: dressing left in place Extremities:  R PT biphasic by doppler Abdomen:  soft Neurologic: A&O  CBC    Component Value Date/Time   WBC 7.9 09/14/2018 0214   RBC 3.09 (L) 09/14/2018 0214   HGB 8.6 (L) 09/14/2018 0214   HCT 25.8 (L) 09/14/2018 0214   PLT 290 09/14/2018 0214   MCV 83.5 09/14/2018 0214   MCH 27.8 09/14/2018 0214   MCHC 33.3 09/14/2018 0214   RDW 13.9 09/14/2018 0214   LYMPHSABS 0.5 (L) 09/09/2018 0711   MONOABS 0.2 09/09/2018 0711   EOSABS 0.1 09/09/2018 0711   BASOSABS 0.0 09/09/2018 0711    BMET    Component Value Date/Time   NA 134 (L) 09/14/2018 0214   K 3.6 09/14/2018 0214   CL 95 (L) 09/14/2018 0214   CO2 27 09/14/2018 0214   GLUCOSE 263 (H) 09/14/2018 0214   BUN <5 (L) 09/14/2018 0214   CREATININE 0.62 09/14/2018 0214   CREATININE 0.92 05/15/2016 1038   CALCIUM 7.6 (L) 09/14/2018 0214   GFRNONAA >60 09/14/2018 0214   GFRAA >60 09/14/2018 0214    INR    Component Value Date/Time   INR 1.28 09/07/2018 1633     Intake/Output Summary (Last 24 hours) at 09/14/2018 0834 Last data filed at 09/14/2018 0600 Gross per 24 hour  Intake 1000 ml  Output 5400 ml  Net -4400 ml     Assessment/Plan:  57 y.o. male is s/p R popliteal and tibial embolectomy with subsequent GI bleed 4 Days Post-Op   R PT biphasic by doppler Fasciotomy incisions closed; dry dressing changes prn H&H stable with heparin titration Plans for EUS 09/16/18 noted Vascular will continue to follow   Dagoberto Ligas, PA-C [facility] [phone] 09/14/2018 8:34 AM  I have seen and evaluated the patient.  I agree with the PA note as documented above. Brisk R PT signal.  Fasciotomies c/d/i since closure.  States had a nosebleed last night.  H&H looks stable.  Had long discussion about anticoagulation and wants to keep trying since seems to be tolerating heparin at this time, although high risk for re-bleed.  GI planning EUS on 12/26.   Marty Heck, MD [facility] [place] [facility] Office: [phone] Pager: [phone]

## 2018-09-14 NOTE — Progress Notes (Signed)
VASCULAR LAB PRELIMINARY  ARTERIAL  ABI completed:    RIGHT    LEFT    PRESSURE WAVEFORM  PRESSURE WAVEFORM  BRACHIAL 132 Triphasic BRACHIAL 131 Triphasic  DP 152 Monophasic DP 162 Triphasic  PT 179 Triphasic PT 176 Triphasic    RIGHT LEFT  ABI 1.36 1.33   ABIs indicate a possible calcification of the arteries bilaterally  Eternity Dexter, RVS 09/14/2018, 5:50 PM

## 2018-09-14 NOTE — Progress Notes (Signed)
Inpatient Rehabilitation Admissions Coordinator  Noted medical workup ongoing. Karene Fry, Admissions Coordinator will follow up on 12/26. She can be reached at [phone].  Danne Baxter, RN, MSN Rehab Admissions Coordinator [phone] 09/14/2018 8:50 AM

## 2018-09-14 NOTE — Progress Notes (Signed)
Pt assessed and noted to have a bloody nose. Pharmacy suggested to continue with the plan of increasing Heparin drip to 12 mL/hr and if bleeding returns, heparin drip will be discontinued early. Pt currently a/o X 4, no acute distress noted. RN to continue to monitor.

## 2018-09-14 NOTE — H&P (View-Only) (Signed)
Daily Rounding Note  09/14/2018, 11:23 AM  LOS: 11 days   SUBJECTIVE:   Chief complaint: Duodenal bulb ulcer. No BMs reported.   No melena.  No stools today.  Eating 100% of his solid meals.  OBJECTIVE:         Vital signs in last 24 hours:    Temp:  [98.8 F (37.1 C)-99.9 F (37.7 C)] 99.2 F (37.3 C) (12/24 0907) Pulse Rate:  [82-97] 86 (12/24 0907) Resp:  [17-22] 22 (12/24 0907) BP: (130-141)/(90-105) 132/105 (12/24 0907) SpO2:  [99 %-100 %] 100 % (12/24 0907) Last BM Date: 09/13/18 Filed Weights   09/03/18 1650  Weight: 66.7 kg   Patient was sleeping quietly, I did not examine him.  No labored breathing.  Intake/Output from previous day: 12/23 0701 - 12/24 0700 In: 1000 [P.O.:600; I.V.:250; IV Piggyback:150] Out: 5400 [Urine:5400]  Intake/Output this shift: No intake/output data recorded.  Lab Results: Recent Labs    09/12/18 1928 09/12/18 2318 09/13/18 0352 09/14/18 0214  WBC 7.5  --  7.9 7.9  HGB 8.7* 8.7* 8.5* 8.6*  HCT 25.8* 26.2* 24.9* 25.8*  PLT 240  --  246 290   BMET Recent Labs    09/12/18 0415 09/13/18 0352 09/14/18 0214  NA 135 138 134*  K 3.4* 3.4* 3.6  CL 104 100 95*  CO2 _0 GLUCOSE 297* 254* 263*  BUN 10 6 <5*  CREATININE 0.75 0.49* 0.62  CALCIUM 7.1* 7.4* 7.6*   LFT Recent Labs    09/12/18 0415 09/13/18 0352 09/14/18 0214  PROT 3.7* 4.2* 4.8*  ALBUMIN 1.2* 1.4* 1.6*  AST 20 26 32  ALT _1 ALKPHOS 52 53 59  BILITOT 0.5 0.6 0.8   PT/INR No results for input(s): LABPROT, INR in the last 72 hours. Hepatitis Panel No results for input(s): HEPBSAG, HCVAB, HEPAIGM, HEPBIGM in the last 72 hours.  Studies/Results: No results found.   Scheduled Meds: . atorvastatin  20 mg Oral Daily  . chlorhexidine gluconate (MEDLINE KIT)  15 mL Mouth Rinse BID  . insulin aspart  0-9 Units Subcutaneous TID WC  . lidocaine  15 mL Oral Once  . mouth rinse  15 mL  Mouth Rinse BID  . pantoprazole  40 mg Oral BID  . tamsulosin  0.4 mg Oral Daily   Continuous Infusions: . sodium chloride    . heparin 1,200 Units/hr (09/14/18 0600)   PRN Meds:.sodium chloride, ipratropium-albuterol, LORazepam, ondansetron **OR** ondansetron (ZOFRAN) IV, oxyCODONE-acetaminophen, polyethylene glycol, senna-docusate, sodium chloride flush   ASSESMENT:   *     Pancreatic mass.   EUS with FNA planned on 09/16/2018 at 1330 for further evaluation. [id number] 61.  *      GI bleed.  CGE.  Bloody stool. EGD 09/08/18: severe esophagitis, duodenal bulb ulcer and overlying clot treated with hemospray.  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 On BID Po PPI.  Serum H. pylori IgA positive, IgG positive, IgM negative.   Note that stool H. pylori antigen test will not be accurate if he is on PPI, so will need to wait on this study.    *     Anemia acute due to blood loss, suspect chronic anemia is baseline.  Over the course of the hospitalization received 10 U PRBCs.    *    Status post popliteal and tibial artery embolectomy 4/13., subsequent fasciotomy with evacuation of hematoma 12/15.  Repeat right popliteal  and tibial embolectomy with closure of medial and lateral fasciotomy 12/20.   PLAN   *   Note the patient is currently n.p.o. for planned TEE today.  Please remember to restart regular diet after this.  I will order clear liquids for breakfast on Thursday but n.p.o. after that.    Azucena Freed  09/14/2018, 11:23 AM Phone [phone]

## 2018-09-14 NOTE — Progress Notes (Signed)
[place] for Heparin Indication: Arterial embolism  Allergies  Allergen Reactions  . Lisinopril Anaphylaxis    Patient Measurements: Height: 5\' 11"  (180.3 cm) Weight: 147 lb 0.8 oz (66.7 kg) IBW/kg (Calculated) : 75.3 Heparin Dosing Weight: 66.7  Vital Signs: Temp: 99.2 F (37.3 C) (12/24 0907) Temp Source: Oral (12/24 0907) BP: 132/105 (12/24 0907) Pulse Rate: 86 (12/24 0907)  Labs: Recent Labs    09/11/18 1624  09/12/18 0415  09/12/18 1928 09/12/18 2318 09/13/18 0352  09/14/18 0214 09/14/18 0938 09/14/18 1533  HGB  --   --  8.6*   < > 8.7* 8.7* 8.5*  --  8.6*  --   --   HCT  --   --  25.9*   < > 25.8* 26.2* 24.9*  --  25.8*  --   --   PLT  --   --  210   < > 240  --  246  --  290  --   --   APTT 30  --  30  --   --   --   --   --   --   --   --   HEPARINUNFRC  --    < > <0.10*  --  <0.10*  --   --    < > 0.21* 0.35 0.40  CREATININE  --   --  0.75  --   --   --  0.49*  --  0.62  --   --    < > = values in this interval not displayed.    Estimated Creatinine Clearance: 96.1 mL/min (by C-G formula based on SCr of 0.62 mg/dL).   Assessment: 57 yo male with artial embolus s/p embolectomy. Patient had development of hematoma with compartment syndrome as well as hemorrhagic shock secondary to a bleeding duodenal ulcer. Plan is for EUS with FNA on 12/26 to assess pancreatic mass.  Heparin level is therapeutic at 0.4 on 1200 units/hr. Per note nose bleed last night. Hgb low stable in 8s, platelets are normal.   Goal of Therapy:  Heparin level 0.3-0.5 units/mL Monitor platelets by anticoagulation protocol: Yes   Plan:  Continue heparin drip at 1200 units/hr Daily heparin level and CBC Close monitoring of Hgb Heparin drip scheduled to stop on 12/26 at 07:00 per discussion with GI  Harrietta Guardian, PharmD PGY1 Pharmacy Resident 09/14/2018    4:14 PM Please check AMION for all [place] numbers

## 2018-09-14 NOTE — Progress Notes (Signed)
PROGRESS NOTE        PATIENT DETAILS Name: Ryan Medina Age: 56 y.o. Sex: male Date of Birth: [birth year] Admit Date: 09/03/2018 Admitting Physician [name], MD [id number], No Pcp Per  Brief Narrative: Patient is a 57 y.o. male with history of cocaine use, chronic systolic heart failure presented to the hospital on 12/13 with a right ischemic leg secondary to acute popliteal and tibial embolus, underwent embolectomy on 12/13 and subsequently was placed on a heparin drip.  He unfortunately developed a acute right lower leg hematoma with compartment syndrome requiring fasciotomy on 12/15.  Further hospital course was complicated by development of hemorrhagic shock with acute blood loss anemia secondary to a bleeding duodenal ulcer-requiring ICU transfer-intubation for airway protection and IR evaluation with mesenteric angiogram and GDA embolization.  Upon stability he was transferred back to the triad hospitalist service on 12/19.  See below for further details.  Subjective:  Patient in bed, appears comfortable, denies any headache, no fever, no chest pain or pressure, no shortness of breath , no abdominal pain. No focal weakness.    Assessment/Plan:  Right ischemic leg secondary to popliteal artery/tibial artery embolism-s/p embolectomy on 54/65 complicated by right lower extremity hematoma with compartment syndrome requiring fasciotomy on 12/15:   Source of embolization unclear, required fasciotomy on 09/05/2018 by vascular surgery status post closure of fasciotomy on 09/10/2018.  His course was complicated by large hematoma in the right leg along with significant life-threatening GI bleed.  Is currently on heparin drip , case discussed with vascular surgeon Dr. Monica Martinez who wants to continue anticoagulation as much as possible.  Also TEE requested on 09/11/2018 to figure out etiology of his arterial embolus will likely be done after 09/16/2018  EUS due to severe esophagitis reported on EGD done this admission.  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  Upper GI bleeding with hemorrhagic shock and acute blood loss anemia: Developed hemorrhagic shock, was seen by GI, required multiple PRBC transfusions last transfusion on 09/09/2018.  Was seen by GI and eventually underwent gastroduodenal artery embolization on 09/07/2018.  Total 8 units of packed RBC, 1 unit of FFP and 1 unit of platelets transfused.  Currently H&H seems to have stabilized. GI note noted from 09/10/2018 (okay with heparin challenge).   Will cautiously monitor H&H, currently evidence of old blood in stool with relatively stable H&H, if H&H drops obviously heparin will have to be held again.  However will continue heparin as vascular surgery strongly feels that heparin continuation is needed at this time.   Acute hypoxic respiratory failure: Intubated when he developed hemorrhagic shock secondary to GI bleeding, successfully extubated-currently stable on 2 L of oxygen via nasal cannula.  Chronic systolic heart failure (EF 45-50% by TTE on 09/04/2018): Reasonably well compensated-follow weights, volume status, electrolytes closely.  Cocaine use/tobacco use: Counseled-I am still not sure if he has any intention of quitting.  3 cm AAA: Stable for follow-up in the outpatient setting  New pancreatic mass noted on CT scan on 09/11/2018.  MRI noted & confirms mass, CA 19.9 has been elevated, GI seeing and planning to do EUS on 09/16/2018.  Insulin-dependent DM-2: Last A1c on 12/2 was 18.2.  Increase Lantus for better control continue sliding scale.  CBG (last 3)  Recent Labs    09/13/18 1642 09/13/18 2231 09/14/18  0830  GLUCAP 218* 227* 232*      DVT Prophylaxis: Heparin full dose started on 09/10/2018 by vascular surgery  Code Status: Full code   Family Communication: None at bedside  Disposition Plan: Remain  inpatient-requires several more days of hospitalization before consideration of discharge  Antimicrobial agents: Anti-infectives (From admission, onward)   Start     Dose/Rate Route Frequency Ordered Stop   09/10/18 2130  ceFAZolin (ANCEF) IVPB 2g/100 mL premix     2 g 200 mL/hr over 30 Minutes Intravenous Every 8 hours 09/10/18 1634 09/11/18 0700   09/10/18 0800  ceFAZolin (ANCEF) IVPB 1 g/50 mL premix    Note to Pharmacy:  Send with pt to OR   1 g 100 mL/hr over 30 Minutes Intravenous On call 09/09/18 0756 09/10/18 1407   09/07/18 1300  erythromycin 250 mg in sodium chloride 0.9 % 100 mL IVPB     250 mg 100 mL/hr over 60 Minutes Intravenous Once 09/07/18 1125 09/07/18 2351   09/06/18 0000  ceFAZolin (ANCEF) IVPB 1 g/50 mL premix    Note to Pharmacy:  Send with pt to OR   1 g 100 mL/hr over 30 Minutes Intravenous On call 09/05/18 0849 09/05/18 0952   09/03/18 2245  ceFAZolin (ANCEF) IVPB 2g/100 mL premix     2 g 200 mL/hr over 30 Minutes Intravenous Every 8 hours 09/03/18 2236 09/04/18 1444   09/03/18 1615  ceFAZolin (ANCEF) IVPB 2g/100 mL premix  Status:  Discontinued     2 g 200 mL/hr over 30 Minutes Intravenous On call to O.R. 09/03/18 1600 09/03/18 2035      Procedures:  12/17>>Mesenteric angiogram and coil embolization of the GDA  12/17>>EGD  12/15>> 1.  Evacuation hematoma right leg 2.  Evacuation of lymphocele right leg 3.  4 compartment fasciotomy 4.  Placement of VAC (medial and lateral)  12/13>> 1.  Ultrasound-guided access to the left common femoral artery 2.  Aortogram with bilateral iliac arteriogram 3.  Selective catheterization of the right external iliac artery with right lower extremity runoff  MRI Abd.  Pancreatic mass.     CONSULTS:  pulmonary/intensive care, vascular surgery and IR, GI, Cards  Time spent: 40 minutes-Greater than 50% of this time was spent in counseling, explanation of diagnosis, planning of further management, and  coordination of care.  MEDICATIONS: Scheduled Meds: . atorvastatin  20 mg Oral Daily  . chlorhexidine gluconate (MEDLINE KIT)  15 mL Mouth Rinse BID  . insulin aspart  0-9 Units Subcutaneous TID WC  . lidocaine  15 mL Oral Once  . mouth rinse  15 mL Mouth Rinse BID  . pantoprazole  40 mg Oral BID  . tamsulosin  0.4 mg Oral Daily   Continuous Infusions: . sodium chloride    . heparin 1,200 Units/hr (09/14/18 0600)   PRN Meds:.sodium chloride, ipratropium-albuterol, LORazepam, ondansetron **OR** ondansetron (ZOFRAN) IV, oxyCODONE-acetaminophen, polyethylene glycol, senna-docusate, sodium chloride flush   PHYSICAL EXAM: Vital signs: Vitals:   09/13/18 1450 09/13/18 2231 09/14/18 0513 09/14/18 0907  BP: 130/90 (!) 141/100 (!) 131/94 (!) 132/105  Pulse: 82 85 97 86  Resp: _0 (!) 22  Temp: 98.8 F (37.1 C) 99.4 F (37.4 C) 99.9 F (37.7 C) 99.2 F (37.3 C)  TempSrc: Oral Oral Oral Oral  SpO2: 99% 99% 100% 100%  Weight:      Height:       Filed Weights   09/03/18 1650  Weight: 66.7 kg   Body mass  index is 20.51 kg/m.   Exam  Awake Alert, Oriented X 3, No new F.N deficits, Normal affect [place].AT,PERRAL Supple Neck,No JVD, No cervical lymphadenopathy appriciated.  Symmetrical Chest wall movement, Good air movement bilaterally, CTAB RRR,No Gallops, Rubs or new Murmurs, No Parasternal Heave +ve B.Sounds, Abd Soft, No tenderness, No organomegaly appriciated, No rebound - guarding or rigidity. No Cyanosis, Clubbing or edema, No new Rash or bruise    I have personally reviewed following labs and imaging studies  LABORATORY DATA: CBC: Recent Labs  Lab 09/07/18 1633 09/08/18 0255 09/08/18 1454 09/08/18 2326  09/09/18 0711  09/12/18 0945 09/12/18 1322 09/12/18 1928 09/12/18 2318 09/13/18 0352 09/14/18 0214  WBC 10.8* 11.3* 12.3* 13.4*  --  12.4*   < > 8.1 7.2 7.5  --  7.9 7.9  NEUTROABS 10.4* 9.5* 11.2* 11.5*  --  11.5*  --   --   --   --   --   --   --     HGB 6.8* 8.7* 7.9* 7.0*   < > 7.3*   < > 9.6* 8.5* 8.7* 8.7* 8.5* 8.6*  HCT 20.3* 25.9* 24.5* 21.4*   < > 22.3*   < > 28.9* 25.5* 25.8* 26.2* 24.9* 25.8*  MCV 84.6 85.8 85.4 83.9  --  84.8   < > 85.0 82.5 84.6  --  84.4 83.5  PLT 216 231 165 146*  --  140*   < > 241 213 240  --  246 290   < > = values in this interval not displayed.    Basic Metabolic Panel: Recent Labs  Lab 09/08/18 0500 09/09/18 0437  09/10/18 0412 09/11/18 0410 09/12/18 0415 09/13/18 0352 09/14/18 0214  NA 141  --    < > 133* 133* 135 138 134*  K 4.0  --    < > 3.3* 3.6 3.4* 3.4* 3.6  CL 110  --    < > 102 100 104 100 95*  CO2 25  --    < > _0 GLUCOSE 94  --    < > 221* 294* 297* 254* 263*  BUN 32*  --    < > _1 <5*  CREATININE 0.69  --    < > 0.57* 0.60* 0.75 0.49* 0.62  CALCIUM 7.3*  --    < > 7.0* 7.0* 7.1* 7.4* 7.6*  MG 2.1 1.8  --  1.8  --  1.6* 1.7  --   PHOS 2.8 2.0*  --  2.3*  --   --   --   --    < > = values in this interval not displayed.    GFR: Estimated Creatinine Clearance: 96.1 mL/min (by C-G formula based on SCr of 0.62 mg/dL).  Liver Function Tests: Recent Labs  Lab 09/09/18 0711 09/10/18 0412 09/12/18 0415 09/13/18 0352 09/14/18 0214  AST 32 _2 32  ALT _3 ALKPHOS 44 52 52 53 59  BILITOT 0.7 0.6 0.5 0.6 0.8  PROT 3.5* 3.8* 3.7* 4.2* 4.8*  ALBUMIN 1.3* 1.3* 1.2* 1.4* 1.6*   No results for input(s): LIPASE, AMYLASE in the last 168 hours. Recent Labs  Lab 09/07/18 1630  AMMONIA 25    Coagulation Profile: Recent Labs  Lab 09/07/18 1633  INR 1.28    Cardiac Enzymes: No results for input(s): CKTOTAL, CKMB, CKMBINDEX, TROPONINI in the last 168 hours.  BNP (last 3 results) No results for input(s):  PROBNP in the last 8760 hours.  HbA1C: No results for input(s): HGBA1C in the last 72 hours.  CBG: Recent Labs  Lab 09/13/18 0757 09/13/18 1157 09/13/18 1642 09/13/18 2231 09/14/18 0830  GLUCAP 226* 214* 218* 227* 232*     Lipid Profile: No results for input(s): CHOL, HDL, LDLCALC, TRIG, CHOLHDL, LDLDIRECT in the last 72 hours.  Thyroid Function Tests: No results for input(s): TSH, T4TOTAL, FREET4, T3FREE, THYROIDAB in the last 72 hours.  Anemia Panel: No results for input(s): VITAMINB12, FOLATE, FERRITIN, TIBC, IRON, RETICCTPCT in the last 72 hours.  Urine analysis:    Component Value Date/Time   COLORURINE STRAW (A) 08/22/2018 2054   APPEARANCEUR CLEAR 08/22/2018 2054   LABSPEC 1.027 08/22/2018 2054   PHURINE 6.0 08/22/2018 2054   GLUCOSEU >=500 (A) 08/22/2018 2054   HGBUR NEGATIVE 08/22/2018 2054   [place] NEGATIVE 08/22/2018 2054   KETONESUR 80 (A) 08/22/2018 2054   PROTEINUR NEGATIVE 08/22/2018 2054   UROBILINOGEN 4.0 (H) 12/05/2016 0921   NITRITE NEGATIVE 08/22/2018 2054   LEUKOCYTESUR NEGATIVE 08/22/2018 2054    Sepsis Labs: Lactic Acid, Venous    Component Value Date/Time   LATICACIDVEN 1.7 09/07/2018 1600    MICROBIOLOGY: Recent Results (from the past 240 hour(s))  Surgical pcr screen     Status: None   Collection Time: 09/10/18  3:39 AM  Result Value Ref Range Status   MRSA, PCR NEGATIVE NEGATIVE Final   Staphylococcus aureus NEGATIVE NEGATIVE Final    Comment: (NOTE) The Xpert SA Assay (FDA approved for NASAL specimens in patients 76 years of age and older), is one component of a comprehensive surveillance program. It is not intended to diagnose infection nor to guide or monitor treatment. Performed at [facility], [place] [address], [place], [place] [address]     RADIOLOGY STUDIES/RESULTS: Dg Abd 1 View  Result Date: 09/07/2018 CLINICAL DATA:  OG tube placement. EXAM: ABDOMEN - 1 VIEW COMPARISON:  CT abdomen pelvis 08/22/2018. FINDINGS: OG tube is in place with the side port and tip in the body of the stomach. The stomach is distended. IMPRESSION: OG tube in good position. Electronically Signed   By: Inge Rise M.D.   On: 09/07/2018 13:31   Ct  Angio Chest Pe W Or Wo Contrast  Result Date: 09/06/2018 CLINICAL DATA:  Evaluate thoracic aortic aneurysm. EXAM: CT ANGIOGRAPHY CHEST WITH CONTRAST TECHNIQUE: Multidetector CT imaging of the chest was performed using the standard protocol during bolus administration of intravenous contrast. Multiplanar CT image reconstructions and MIPs were obtained to evaluate the vascular anatomy. CONTRAST:  65 mL ISOVUE-370 IOPAMIDOL (ISOVUE-370) INJECTION 76% COMPARISON:  01/30/2018; 10/14/2016; 04/25/2026 FINDINGS: Vascular Findings: No evidence of thoracic aortic aneurysm with measurements as follows. No definite evidence of thoracic aortic dissection or periaortic stranding on this nongated examination. Bovine configuration of the aortic arch. The branch vessels of the aortic arch appear widely patent throughout their imaged courses. The descending thoracic aorta is of normal caliber and widely patent without hemodynamically significant stenosis. There is a very minimal amount of atherosclerotic plaque involving the proximal aspect of the descending thoracic aorta. Cardiomegaly.  No pericardial effusion. Although this examination was not tailored for the evaluation the pulmonary arteries, there are no discrete filling defects within the central pulmonary arterial tree to suggest central pulmonary embolism. Borderline enlarged caliber the main pulmonary artery measuring 32 mm in diameter (image 67, series 6) ------------------------------------------------------------- Thoracic aortic measurements: Sinotubular junction 33 mm as measured in greatest oblique coronal dimension.  Proximal ascending aorta 35 mm as measured in greatest oblique axial dimension at the level of the main pulmonary artery (image 70, series 6); and approximately 36 mm in greatest oblique short axis coronal diameter (coronal image 48, series 9). Aortic arch aorta 28 mm as measured in greatest oblique sagittal dimension. Proximal descending thoracic  aorta 30 mm as measured in greatest oblique axial dimension at the level of the main pulmonary artery. Distal descending thoracic aorta 29 mm as measured in greatest oblique axial dimension at the level of the diaphragmatic hiatus. Review of the MIP images confirms the above findings. ------------------------------------------------------------- Non-Vascular Findings: Mediastinum/Lymph Nodes: Prominent right hilar lymph nodes are not enlarged by size criteria with index right suprahilar lymph node measuring 0.8 cm in greatest short axis diameter (image 71, series 6) and index right infrahilar lymph node measuring 0.7 cm (image 88). No definitive bulky mediastinal, hilar or axillary lymphadenopathy. Lungs/Pleura: Interval development of small bilateral pleural effusions with worsening bibasilar consolidative opacities and associated air bronchograms, right greater than left. Interval development of an approximately 1.0 x 0.5 cm subpleural consolidative opacity within the right upper lobe (image 36, series 7). Unchanged punctate (approximately 3 mm) calcified granuloma within the left lower lobe (image 75, series 3). Evaluation for additional pulmonary nodules is degraded secondary to bibasilar airspace opacities. The central pulmonary airways appear widely patent. Upper abdomen: Fluid distention of the mid and distal aspects of the esophagus with marked distension of the imaged superior aspect of the stomach. Musculoskeletal: No acute or aggressive osseous abnormalities. There is incomplete fusion involving the spinous processes of multiple thoracic vertebral bodies, unchanged. Regional soft tissues appear normal. Normal appearance of the thyroid gland. IMPRESSION: 1. No evidence of thoracic aortic aneurysm or dissection. 2. Small bilateral effusions with associated bibasilar consolidative opacities with associated air bronchograms, atelectasis versus infiltrate. Follow-up chest radiograph in 4-6 weeks after  treatment is recommended to ensure resolution. 3. Fluid distension of the mid and distal aspects of the esophagus as well as marked distension of the imaged superior aspect stomach - correlation for gastric outlet obstructive symptoms is advised. 4. Cardiomegaly with enlargement of the caliber the main pulmonary artery as could be seen in the setting of pulmonary arterial hypertension. Electronically Signed   By: Sandi Mariscal M.D.   On: 09/06/2018 09:20   Mr Abdomen W Wo Contrast  Result Date: 09/12/2018 CLINICAL DATA:  Suspicion of pancreatic neck mass on CT. EXAM: MRI ABDOMEN WITHOUT AND WITH CONTRAST TECHNIQUE: Multiplanar multisequence MR imaging of the abdomen was performed both before and after the administration of intravenous contrast. CONTRAST:  6 cc Gadavist COMPARISON:  CT of 1 day prior. Stone study of 08/22/2018. More remote CT of 12/19/2017. FINDINGS: Moderatemultifactorial degradation, including respiratory motion and susceptibility artifact from embolization coils. Lower chest: Bilateral pleural fluid with bibasilar airspace disease. Normal heart size. Hepatobiliary: Left hepatic lobe 9 mm cyst. Too small to characterizea right hepatic lobe lesion is most likely a cyst at 2 mm. Normal gallbladder, without biliary ductal dilatation. Pancreas: An area of soft tissue fullness and restricted diffusion is identified within the pancreatic neck, corresponding to the abnormality on CT. This measures on the order of 3.9 x 3.6 cm on image 76/12. Also 3.6 x 3.6 cm on image 35/31. Results in pancreatic duct dilatation within the upstream body and tail on image 17/11. Involves the celiac and its branches including on image 75/12. Spleen:  Grossly within normal limits. Adrenals/Urinary Tract: Normal adrenal glands. Grossly normal kidneys, without  hydronephrosis. Stomach/Bowel: Gastric distension with proximal wall and fold thickening, including on image 73/12. Grossly normal abdominal bowel loops.  Vascular/Lymphatic: Normal caliber of the aorta. 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 Limited evaluation for abdominal adenopathy. Other:  Small volume abdominal ascites. Musculoskeletal: No acute osseous abnormality. IMPRESSION: 1. Moderate motion and susceptibility artifact degradation throughout. 2. Confirmation of a pancreatic neck mass, most consistent with adenocarcinoma. Involvement of the celiac and its branches, most consistent with a non-operative lesion. 3. Consider endoscopic ultrasound sampling. 4. Gastric wall and fold thickening, suggesting gastritis. 5. Small bilateral pleural effusions with adjacent airspace disease. Electronically Signed   By: Abigail Miyamoto M.D.   On: 09/12/2018 03:04   Ir Angiogram Visceral Selective  Result Date: 09/07/2018 INDICATION: 57 year old male with large volume upper GI bleed secondary to a very large duodenal ulcer. Patient underwent endoscopic intervention but continues to require pressors and large volume transfusion. He presents for mesenteric arteriography and embolization of any active bleeding and prophylactic embolization of the gastroduodenal artery. EXAM: IR ULTRASOUND GUIDANCE VASC ACCESS RIGHT; ADDITIONAL ARTERIOGRAPHY; IR EMBO ART VEN HEMORR LYMPH EXTRAV INC GUIDE ROADMAPPING; SELECTIVE VISCERAL ARTERIOGRAPHY 1. Ultrasound-guided vascular access 2. Catheterization of the left gastric artery where it arises directly from the aorta with arteriogram 3. Catheterization of the celiac artery with arteriogram 4. Catheterization of the common hepatic artery with arteriogram 5. Catheterization of the gastroduodenal artery with arteriogram 6. Coil embolization of the gastroduodenal artery 7. Catheterization of the superior mesenteric artery with arteriogram MEDICATIONS: None ANESTHESIA/SEDATION: Patient intubated and on propofol drip CONTRAST:  70 mL Isovue 370 FLUOROSCOPY TIME:  Fluoroscopy Time: 11 minutes 30 seconds (487 mGy). COMPLICATIONS:  None immediate. PROCEDURE: Informed consent was obtained from the patient following explanation of the procedure, risks, benefits and alternatives. The patient understands, agrees and consents for the procedure. All questions were addressed. A time out was performed prior to the initiation of the procedure. Maximal barrier sterile technique utilized including caps, mask, sterile gowns, sterile gloves, large sterile drape, hand hygiene, and Betadine prep. The right common femoral artery was interrogated with ultrasound and found to be widely patent. An image was obtained and stored for the medical record. Local anesthesia was attained by infiltration with 1% lidocaine. A small dermatotomy was made. Under real-time sonographic guidance, the vessel was punctured with a 21 gauge micropuncture needle. Using standard technique, the initial micro needle was exchanged over a 0.018 micro wire for a transitional 4 [place] micro sheath. The micro sheath was then exchanged over a 0.035 wire for a 5 French vascular sheath. A C2 cobra catheter was advanced in the abdominal aorta over a Bentson wire. The catheter was used to select the first vessel arising from the ventral aorta. Contrast was injected. This is a left gastric artery arising directly from the aorta. Anatomy is normal. No evidence of active bleeding. The Cobra catheter was then used to select the origin of the celiac artery. Arteriography was performed. The splenic artery is small in caliber. The hepatic artery demonstrates normal hepatic arterial anatomy. The gastroduodenal artery is slightly irregular with spasm in the proximal segment. No evidence of active extravasation. A glidewire was successfully advanced into the common hepatic artery and the C2 cobra catheter was advanced into the common hepatic artery. Additional arteriography was performed in multiple obliquities. Again, the proximal gastroduodenal artery is irregular and spasmodic. No evidence of  dissection, aneurysm or active bleeding. A Lantern microcatheter was then advanced over a Fathom 16 wire and used to  select the distal aspect of the gastroduodenal artery. Arteriography was performed. The gastroepiploic arteries are widely patent. No evidence of active bleeding. Coil embolization was then performed using a 4 mm Ruby POD detachable micro coil followed by a 45 cm Ruby packing coil. Post embolization arteriography demonstrates complete occlusion of the vessel with stasis in the proximal segment. The Lantern microcatheter was brought back into the common hepatic artery and arteriography was performed. Again, complete occlusion of the gastroduodenal artery. The right gastric artery is visualized. No evidence of active bleeding from the right gastric artery. The microcatheter was removed. The C2 cobra catheter was used to select the superior mesenteric artery. Arteriography was performed. Patent pancreaticoduodenal arcade supplying the gastroepiploic artery. No evidence of active hemorrhage. The C2 cobra catheter was removed. A limited right common femoral arteriogram was performed confirming common femoral arterial access. Hemostasis was attained with the assistance of a Cordis ExoSeal extra arterial vascular plug. IMPRESSION: 1. No evidence of active hemorrhage. 2. 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 3. Prophylactic coil embolization of the gastroduodenal artery. Signed, Criselda Peaches, MD, [facility] Radiology Electronically Signed   By: Jacqulynn Cadet M.D.   On: 09/07/2018 22:32   Ir Angiogram Visceral Selective  Result Date: 09/07/2018 INDICATION: 57 year old male with large volume upper GI bleed secondary to a very large duodenal ulcer. Patient underwent endoscopic intervention but continues to require pressors and large volume transfusion. He presents for mesenteric arteriography and embolization of any  active bleeding and prophylactic embolization of the gastroduodenal artery. EXAM: IR ULTRASOUND GUIDANCE VASC ACCESS RIGHT; ADDITIONAL ARTERIOGRAPHY; IR EMBO ART VEN HEMORR LYMPH EXTRAV INC GUIDE ROADMAPPING; SELECTIVE VISCERAL ARTERIOGRAPHY 1. Ultrasound-guided vascular access 2. Catheterization of the left gastric artery where it arises directly from the aorta with arteriogram 3. Catheterization of the celiac artery with arteriogram 4. Catheterization of the common hepatic artery with arteriogram 5. Catheterization of the gastroduodenal artery with arteriogram 6. Coil embolization of the gastroduodenal artery 7. Catheterization of the superior mesenteric artery with arteriogram MEDICATIONS: None ANESTHESIA/SEDATION: Patient intubated and on propofol drip CONTRAST:  70 mL Isovue 370 FLUOROSCOPY TIME:  Fluoroscopy Time: 11 minutes 30 seconds (487 mGy). COMPLICATIONS: None immediate. PROCEDURE: Informed consent was obtained from the patient following explanation of the procedure, risks, benefits and alternatives. The patient understands, agrees and consents for the procedure. All questions were addressed. A time out was performed prior to the initiation of the procedure. Maximal barrier sterile technique utilized including caps, mask, sterile gowns, sterile gloves, large sterile drape, hand hygiene, and Betadine prep. The right common femoral artery was interrogated with ultrasound and found to be widely patent. An image was obtained and stored for the medical record. Local anesthesia was attained by infiltration with 1% lidocaine. A small dermatotomy was made. Under real-time sonographic guidance, the vessel was punctured with a 21 gauge micropuncture needle. Using standard technique, the initial micro needle was exchanged over a 0.018 micro wire for a transitional 4 [place] micro sheath. The micro sheath was then exchanged over a 0.035 wire for a 5 French vascular sheath. A C2 cobra catheter was advanced in the  abdominal aorta over a Bentson wire. The catheter was used to select the first vessel arising from the ventral aorta. Contrast was injected. This is a left gastric artery arising directly from the aorta. Anatomy is normal. No evidence of active bleeding. The Cobra catheter was then used to select the origin of the celiac artery. Arteriography was  performed. The splenic artery is small in caliber. The hepatic artery demonstrates normal hepatic arterial anatomy. The gastroduodenal artery is slightly irregular with spasm in the proximal segment. No evidence of active extravasation. A glidewire was successfully advanced into the common hepatic artery and the C2 cobra catheter was advanced into the common hepatic artery. Additional arteriography was performed in multiple obliquities. Again, the proximal gastroduodenal artery is irregular and spasmodic. No evidence of dissection, aneurysm or active bleeding. A Lantern microcatheter was then advanced over a Fathom 16 wire and used to select the distal aspect of the gastroduodenal artery. Arteriography was performed. The gastroepiploic arteries are widely patent. No evidence of active bleeding. Coil embolization was then performed using a 4 mm Ruby POD detachable micro coil followed by a 45 cm Ruby packing coil. Post embolization arteriography demonstrates complete occlusion of the vessel with stasis in the proximal segment. The Lantern microcatheter was brought back into the common hepatic artery and arteriography was performed. Again, complete occlusion of the gastroduodenal artery. The right gastric artery is visualized. No evidence of active bleeding from the right gastric artery. The microcatheter was removed. The C2 cobra catheter was used to select the superior mesenteric artery. Arteriography was performed. Patent pancreaticoduodenal arcade supplying the gastroepiploic artery. No evidence of active hemorrhage. The C2 cobra catheter was removed. A limited right  common femoral arteriogram was performed confirming common femoral arterial access. Hemostasis was attained with the assistance of a Cordis ExoSeal extra arterial vascular plug. IMPRESSION: 1. No evidence of active hemorrhage. 2. 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 3. Prophylactic coil embolization of the gastroduodenal artery. Signed, Criselda Peaches, MD, [place] [facility] Radiology Electronically Signed   By: Jacqulynn Cadet M.D.   On: 09/07/2018 22:32   Ir Angiogram Selective Each Additional Vessel  Result Date: 09/07/2018 INDICATION: 57 year old male with large volume upper GI bleed secondary to a very large duodenal ulcer. Patient underwent endoscopic intervention but continues to require pressors and large volume transfusion. He presents for mesenteric arteriography and embolization of any active bleeding and prophylactic embolization of the gastroduodenal artery. EXAM: IR ULTRASOUND GUIDANCE VASC ACCESS RIGHT; ADDITIONAL ARTERIOGRAPHY; IR EMBO ART VEN HEMORR LYMPH EXTRAV INC GUIDE ROADMAPPING; SELECTIVE VISCERAL ARTERIOGRAPHY 1. Ultrasound-guided vascular access 2. Catheterization of the left gastric artery where it arises directly from the aorta with arteriogram 3. Catheterization of the celiac artery with arteriogram 4. Catheterization of the common hepatic artery with arteriogram 5. Catheterization of the gastroduodenal artery with arteriogram 6. Coil embolization of the gastroduodenal artery 7. Catheterization of the superior mesenteric artery with arteriogram MEDICATIONS: None ANESTHESIA/SEDATION: Patient intubated and on propofol drip CONTRAST:  70 mL Isovue 370 FLUOROSCOPY TIME:  Fluoroscopy Time: 11 minutes 30 seconds (487 mGy). COMPLICATIONS: None immediate. PROCEDURE: Informed consent was obtained from the patient following explanation of the procedure, risks, benefits and alternatives. The patient understands,  agrees and consents for the procedure. All questions were addressed. A time out was performed prior to the initiation of the procedure. Maximal barrier sterile technique utilized including caps, mask, sterile gowns, sterile gloves, large sterile drape, hand hygiene, and Betadine prep. The right common femoral artery was interrogated with ultrasound and found to be widely patent. An image was obtained and stored for the medical record. Local anesthesia was attained by infiltration with 1% lidocaine. A small dermatotomy was made. Under real-time sonographic guidance, the vessel was punctured with a 21 gauge micropuncture needle. Using standard technique, the initial micro needle was  exchanged over a 0.018 micro wire for a transitional 4 [place] micro sheath. The micro sheath was then exchanged over a 0.035 wire for a 5 French vascular sheath. A C2 cobra catheter was advanced in the abdominal aorta over a Bentson wire. The catheter was used to select the first vessel arising from the ventral aorta. Contrast was injected. This is a left gastric artery arising directly from the aorta. Anatomy is normal. No evidence of active bleeding. The Cobra catheter was then used to select the origin of the celiac artery. Arteriography was performed. The splenic artery is small in caliber. The hepatic artery demonstrates normal hepatic arterial anatomy. The gastroduodenal artery is slightly irregular with spasm in the proximal segment. No evidence of active extravasation. A glidewire was successfully advanced into the common hepatic artery and the C2 cobra catheter was advanced into the common hepatic artery. Additional arteriography was performed in multiple obliquities. Again, the proximal gastroduodenal artery is irregular and spasmodic. No evidence of dissection, aneurysm or active bleeding. A Lantern microcatheter was then advanced over a Fathom 16 wire and used to select the distal aspect of the gastroduodenal artery.  Arteriography was performed. The gastroepiploic arteries are widely patent. No evidence of active bleeding. Coil embolization was then performed using a 4 mm Ruby POD detachable micro coil followed by a 45 cm Ruby packing coil. Post embolization arteriography demonstrates complete occlusion of the vessel with stasis in the proximal segment. The Lantern microcatheter was brought back into the common hepatic artery and arteriography was performed. Again, complete occlusion of the gastroduodenal artery. The right gastric artery is visualized. No evidence of active bleeding from the right gastric artery. The microcatheter was removed. The C2 cobra catheter was used to select the superior mesenteric artery. Arteriography was performed. Patent pancreaticoduodenal arcade supplying the gastroepiploic artery. No evidence of active hemorrhage. The C2 cobra catheter was removed. A limited right common femoral arteriogram was performed confirming common femoral arterial access. Hemostasis was attained with the assistance of a Cordis ExoSeal extra arterial vascular plug. IMPRESSION: 1. No evidence of active hemorrhage. 2. 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 3. Prophylactic coil embolization of the gastroduodenal artery. Signed, Criselda Peaches, MD, [place] [facility] Radiology Electronically Signed   By: Jacqulynn Cadet M.D.   On: 09/07/2018 22:32   Ir Angiogram Selective Each Additional Vessel  Result Date: 09/07/2018 INDICATION: 57 year old male with large volume upper GI bleed secondary to a very large duodenal ulcer. Patient underwent endoscopic intervention but continues to require pressors and large volume transfusion. He presents for mesenteric arteriography and embolization of any active bleeding and prophylactic embolization of the gastroduodenal artery. EXAM: IR ULTRASOUND GUIDANCE VASC ACCESS RIGHT; ADDITIONAL ARTERIOGRAPHY; IR EMBO  ART VEN HEMORR LYMPH EXTRAV INC GUIDE ROADMAPPING; SELECTIVE VISCERAL ARTERIOGRAPHY 1. Ultrasound-guided vascular access 2. Catheterization of the left gastric artery where it arises directly from the aorta with arteriogram 3. Catheterization of the celiac artery with arteriogram 4. Catheterization of the common hepatic artery with arteriogram 5. Catheterization of the gastroduodenal artery with arteriogram 6. Coil embolization of the gastroduodenal artery 7. Catheterization of the superior mesenteric artery with arteriogram MEDICATIONS: None ANESTHESIA/SEDATION: Patient intubated and on propofol drip CONTRAST:  70 mL Isovue 370 FLUOROSCOPY TIME:  Fluoroscopy Time: 11 minutes 30 seconds (487 mGy). COMPLICATIONS: None immediate. PROCEDURE: Informed consent was obtained from the patient following explanation of the procedure, risks, benefits and alternatives. The patient understands, agrees and consents for the procedure. All questions  were addressed. A time out was performed prior to the initiation of the procedure. Maximal barrier sterile technique utilized including caps, mask, sterile gowns, sterile gloves, large sterile drape, hand hygiene, and Betadine prep. The right common femoral artery was interrogated with ultrasound and found to be widely patent. An image was obtained and stored for the medical record. Local anesthesia was attained by infiltration with 1% lidocaine. A small dermatotomy was made. Under real-time sonographic guidance, the vessel was punctured with a 21 gauge micropuncture needle. Using standard technique, the initial micro needle was exchanged over a 0.018 micro wire for a transitional 4 [place] micro sheath. The micro sheath was then exchanged over a 0.035 wire for a 5 French vascular sheath. A C2 cobra catheter was advanced in the abdominal aorta over a Bentson wire. The catheter was used to select the first vessel arising from the ventral aorta. Contrast was injected. This is a left  gastric artery arising directly from the aorta. Anatomy is normal. No evidence of active bleeding. The Cobra catheter was then used to select the origin of the celiac artery. Arteriography was performed. The splenic artery is small in caliber. The hepatic artery demonstrates normal hepatic arterial anatomy. The gastroduodenal artery is slightly irregular with spasm in the proximal segment. No evidence of active extravasation. A glidewire was successfully advanced into the common hepatic artery and the C2 cobra catheter was advanced into the common hepatic artery. Additional arteriography was performed in multiple obliquities. Again, the proximal gastroduodenal artery is irregular and spasmodic. No evidence of dissection, aneurysm or active bleeding. A Lantern microcatheter was then advanced over a Fathom 16 wire and used to select the distal aspect of the gastroduodenal artery. Arteriography was performed. The gastroepiploic arteries are widely patent. No evidence of active bleeding. Coil embolization was then performed using a 4 mm Ruby POD detachable micro coil followed by a 45 cm Ruby packing coil. Post embolization arteriography demonstrates complete occlusion of the vessel with stasis in the proximal segment. The Lantern microcatheter was brought back into the common hepatic artery and arteriography was performed. Again, complete occlusion of the gastroduodenal artery. The right gastric artery is visualized. No evidence of active bleeding from the right gastric artery. The microcatheter was removed. The C2 cobra catheter was used to select the superior mesenteric artery. Arteriography was performed. Patent pancreaticoduodenal arcade supplying the gastroepiploic artery. No evidence of active hemorrhage. The C2 cobra catheter was removed. A limited right common femoral arteriogram was performed confirming common femoral arterial access. Hemostasis was attained with the assistance of a Cordis ExoSeal extra  arterial vascular plug. IMPRESSION: 1. No evidence of active hemorrhage. 2. 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 3. Prophylactic coil embolization of the gastroduodenal artery. Signed, Criselda Peaches, MD, [place] [facility] Radiology Electronically Signed   By: Jacqulynn Cadet M.D.   On: 09/07/2018 22:32   Ir US Guide Vasc Access Right  Result Date: 09/07/2018 INDICATION: 57 year old male with large volume upper GI bleed secondary to a very large duodenal ulcer. Patient underwent endoscopic intervention but continues to require pressors and large volume transfusion. He presents for mesenteric arteriography and embolization of any active bleeding and prophylactic embolization of the gastroduodenal artery. EXAM: IR ULTRASOUND GUIDANCE VASC ACCESS RIGHT; ADDITIONAL ARTERIOGRAPHY; IR EMBO ART VEN HEMORR LYMPH EXTRAV INC GUIDE ROADMAPPING; SELECTIVE VISCERAL ARTERIOGRAPHY 1. Ultrasound-guided vascular access 2. Catheterization of the left gastric artery where it arises directly from the aorta with arteriogram 3. Catheterization of  the celiac artery with arteriogram 4. Catheterization of the common hepatic artery with arteriogram 5. Catheterization of the gastroduodenal artery with arteriogram 6. Coil embolization of the gastroduodenal artery 7. Catheterization of the superior mesenteric artery with arteriogram MEDICATIONS: None ANESTHESIA/SEDATION: Patient intubated and on propofol drip CONTRAST:  70 mL Isovue 370 FLUOROSCOPY TIME:  Fluoroscopy Time: 11 minutes 30 seconds (487 mGy). COMPLICATIONS: None immediate. PROCEDURE: Informed consent was obtained from the patient following explanation of the procedure, risks, benefits and alternatives. The patient understands, agrees and consents for the procedure. All questions were addressed. A time out was performed prior to the initiation of the procedure. Maximal barrier sterile technique  utilized including caps, mask, sterile gowns, sterile gloves, large sterile drape, hand hygiene, and Betadine prep. The right common femoral artery was interrogated with ultrasound and found to be widely patent. An image was obtained and stored for the medical record. Local anesthesia was attained by infiltration with 1% lidocaine. A small dermatotomy was made. Under real-time sonographic guidance, the vessel was punctured with a 21 gauge micropuncture needle. Using standard technique, the initial micro needle was exchanged over a 0.018 micro wire for a transitional 4 [place] micro sheath. The micro sheath was then exchanged over a 0.035 wire for a 5 French vascular sheath. A C2 cobra catheter was advanced in the abdominal aorta over a Bentson wire. The catheter was used to select the first vessel arising from the ventral aorta. Contrast was injected. This is a left gastric artery arising directly from the aorta. Anatomy is normal. No evidence of active bleeding. The Cobra catheter was then used to select the origin of the celiac artery. Arteriography was performed. The splenic artery is small in caliber. The hepatic artery demonstrates normal hepatic arterial anatomy. The gastroduodenal artery is slightly irregular with spasm in the proximal segment. No evidence of active extravasation. A glidewire was successfully advanced into the common hepatic artery and the C2 cobra catheter was advanced into the common hepatic artery. Additional arteriography was performed in multiple obliquities. Again, the proximal gastroduodenal artery is irregular and spasmodic. No evidence of dissection, aneurysm or active bleeding. A Lantern microcatheter was then advanced over a Fathom 16 wire and used to select the distal aspect of the gastroduodenal artery. Arteriography was performed. The gastroepiploic arteries are widely patent. No evidence of active bleeding. Coil embolization was then performed using a 4 mm Ruby POD detachable  micro coil followed by a 45 cm Ruby packing coil. Post embolization arteriography demonstrates complete occlusion of the vessel with stasis in the proximal segment. The Lantern microcatheter was brought back into the common hepatic artery and arteriography was performed. Again, complete occlusion of the gastroduodenal artery. The right gastric artery is visualized. No evidence of active bleeding from the right gastric artery. The microcatheter was removed. The C2 cobra catheter was used to select the superior mesenteric artery. Arteriography was performed. Patent pancreaticoduodenal arcade supplying the gastroepiploic artery. No evidence of active hemorrhage. The C2 cobra catheter was removed. A limited right common femoral arteriogram was performed confirming common femoral arterial access. Hemostasis was attained with the assistance of a Cordis ExoSeal extra arterial vascular plug. IMPRESSION: 1. No evidence of active hemorrhage. 2. 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 3. Prophylactic coil embolization of the gastroduodenal artery. Signed, Criselda Peaches, MD, [place] [facility] Radiology Electronically Signed   By: Jacqulynn Cadet M.D.   On: 09/07/2018 22:32   Dg Chest [facility] 1 View  Result  Date: 09/09/2018 CLINICAL DATA:  Respiratory failure EXAM: PORTABLE CHEST 1 VIEW COMPARISON:  09/07/2018 FINDINGS: Endotracheal tube and nasogastric catheter have been removed in the interval. Left jugular central line is again noted in the distal superior vena cava. The cardiac shadow is stable. The lungs are well aerated bilaterally with patchy bibasilar infiltrates and small left pleural effusion. IMPRESSION: Stable bibasilar infiltrates and small left pleural effusion. Electronically Signed   By: Inez Catalina M.D.   On: 09/09/2018 07:17   Dg Chest Port 1 View  Result Date: 09/07/2018 CLINICAL DATA:  Status post fasciotomy and hematoma  back UA shins in the right lower leg 2 days ago. Intubated patient. EXAM: PORTABLE CHEST 1 VIEW COMPARISON:  CT scan of the chest of September 06, 2018 FINDINGS: The lungs are well-expanded. There are patchy increased densities at both bases which are slightly more conspicuous today. There is a small right and and somewhat larger left pleural effusion. The heart and pulmonary vascularity are normal. The endotracheal tube tip projects 4.1 cm above the carina. The esophagogastric tube tip in proximal port project below the GE junction. The left internal jugular venous catheter tip projects over the midportion of the SVC. IMPRESSION: Bibasilar atelectasis or pneumonia. Small right pleural effusion and somewhat larger left pleural effusion. The support tubes are in reasonable position. Electronically Signed   By: David  Martinique M.D.   On: 09/07/2018 13:27   Dg Abd Acute W/chest  Result Date: 08/15/2018 CLINICAL DATA:  Left-sided abdominal pain since July 22, 2018. Weakness and nausea. Vomiting beginning yesterday. Centralized chest pain. EXAM: DG ABDOMEN ACUTE W/ 1V CHEST COMPARISON:  None. FINDINGS: The heart, hila, and mediastinum are normal. No pneumothorax, pulmonary nodule, or infiltrate. No free air, portal venous gas, or pneumatosis. Moderate fecal loading in the colon. No bowel obstruction identified. A rounded calcification to the left of the L4 spinous process is noted. IMPRESSION: 1. Moderate fecal loading throughout the colon. 2. Rounded 2 mm calcification to the left of the L4 spinous process could represent bowel contents. A ureteral stone could not be excluded on this study, however. Recommend clinical correlation. If there is concern for a ureteral stone, CT imaging could better evaluate. Electronically Signed   By: Dorise Bullion III M.D   On: 08/15/2018 19:02   Ct Renal Stone Study  Result Date: 08/22/2018 CLINICAL DATA:  Left flank pain. Nausea vomiting for 4 days. Previous appendectomy.  EXAM: CT ABDOMEN AND PELVIS WITHOUT CONTRAST TECHNIQUE: Multidetector CT imaging of the abdomen and pelvis was performed following the standard protocol without IV contrast. COMPARISON:  CT scan December 19, 2017 FINDINGS: Lower chest: No acute abnormality. Hepatobiliary: No focal liver abnormality is seen. No gallstones, gallbladder wall thickening, or biliary dilatation. Pancreas: Unremarkable. No pancreatic ductal dilatation or surrounding inflammatory changes. Spleen: Normal in size without focal abnormality. Adrenals/Urinary Tract: Adrenal glands are normal. A 3 mm stone is again noted in the mid right kidney. No hydronephrosis or perinephric stranding. Bilateral extrarenal pelvises. No ureterectasis or ureteral stones noted. The bladder is unremarkable. Stomach/Bowel: The stomach is distended. Small bowel is normal. Moderate to severe fecal loading throughout the colon with a stool ball in the rectum. By report, the patient is status post appendectomy. 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 Atherosclerotic changes seen  in the aorta, iliac vessels, and femoral vessels. Reproductive: Prostate is unremarkable. Other: No abdominal wall hernia or abnormality. No abdominopelvic ascites. Musculoskeletal: No acute or significant osseous findings. IMPRESSION: 1. Nonobstructive 3 mm stone in the right kidney. No renal obstruction or other stones noted. 2. Gastric distention of uncertain etiology. 3. Moderate to severe fecal loading throughout the colon. 4. Atherosclerotic changes throughout the abdominal aorta and iliac vessels. 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 Recommend followup by ultrasound  in 3 years. This recommendation follows ACR consensus guidelines: White Paper of the ACR Incidental Findings Committee II on Vascular Findings. Joellyn Rued Radiol 2013; [id number] Electronically Signed   By: Dorise Bullion III M.D   On: 08/22/2018 19:54   Ir Embo Art  [place] Guide Roadmapping  Result Date: 09/07/2018 INDICATION: 57 year old male with large volume upper GI bleed secondary to a very large duodenal ulcer. Patient underwent endoscopic intervention but continues to require pressors and large volume transfusion. He presents for mesenteric arteriography and embolization of any active bleeding and prophylactic embolization of the gastroduodenal artery. EXAM: IR ULTRASOUND GUIDANCE VASC ACCESS RIGHT; ADDITIONAL ARTERIOGRAPHY; IR EMBO ART VEN HEMORR LYMPH EXTRAV INC GUIDE ROADMAPPING; SELECTIVE VISCERAL ARTERIOGRAPHY 1. Ultrasound-guided vascular access 2. Catheterization of the left gastric artery where it arises directly from the aorta with arteriogram 3. Catheterization of the celiac artery with arteriogram 4. Catheterization of the common hepatic artery with arteriogram 5. Catheterization of the gastroduodenal artery with arteriogram 6. Coil embolization of the gastroduodenal artery 7. Catheterization of the superior mesenteric artery with arteriogram MEDICATIONS: None ANESTHESIA/SEDATION: Patient intubated and on propofol drip CONTRAST:  70 mL Isovue 370 FLUOROSCOPY TIME:  Fluoroscopy Time: 11 minutes 30 seconds (487 mGy). COMPLICATIONS: None immediate. PROCEDURE: Informed consent was obtained from the patient following explanation of the procedure, risks, benefits and alternatives. The patient understands, agrees and consents for the procedure. All questions were addressed. A time out was performed prior to the initiation of the procedure. Maximal barrier sterile technique utilized including caps, mask, sterile gowns, sterile gloves, large sterile drape, hand hygiene, and  Betadine prep. The right common femoral artery was interrogated with ultrasound and found to be widely patent. An image was obtained and stored for the medical record. Local anesthesia was attained by infiltration with 1% lidocaine. A small dermatotomy was made. Under real-time sonographic guidance, the vessel was punctured with a 21 gauge micropuncture needle. Using standard technique, the initial micro needle was exchanged over a 0.018 micro wire for a transitional 4 [place] micro sheath. The micro sheath was then exchanged over a 0.035 wire for a 5 French vascular sheath. A C2 cobra catheter was advanced in the abdominal aorta over a Bentson wire. The catheter was used to select the first vessel arising from the ventral aorta. Contrast was injected. This is a left gastric artery arising directly from the aorta. Anatomy is normal. No evidence of active bleeding. The Cobra catheter was then used to select the origin of the celiac artery. Arteriography was performed. The splenic artery is small in caliber. The hepatic artery demonstrates normal hepatic arterial anatomy. The gastroduodenal artery is slightly irregular with spasm in the proximal segment. No evidence of active extravasation. A glidewire was successfully advanced into the common hepatic artery and the C2 cobra catheter was advanced into the common hepatic artery. Additional arteriography was performed in multiple obliquities.  Again, the proximal gastroduodenal artery is irregular and spasmodic. No evidence of dissection, aneurysm or active bleeding. A Lantern microcatheter was then advanced over a Fathom 16 wire and used to select the distal aspect of the gastroduodenal artery. Arteriography was performed. The gastroepiploic arteries are widely patent. No evidence of active bleeding. Coil embolization was then performed using a 4 mm Ruby POD detachable micro coil followed by a 45 cm Ruby packing coil. Post embolization arteriography demonstrates complete  occlusion of the vessel with stasis in the proximal segment. The Lantern microcatheter was brought back into the common hepatic artery and arteriography was performed. Again, complete occlusion of the gastroduodenal artery. The right gastric artery is visualized. No evidence of active bleeding from the right gastric artery. The microcatheter was removed. The C2 cobra catheter was used to select the superior mesenteric artery. Arteriography was performed. Patent pancreaticoduodenal arcade supplying the gastroepiploic artery. No evidence of active hemorrhage. The C2 cobra catheter was removed. A limited right common femoral arteriogram was performed confirming common femoral arterial access. Hemostasis was attained with the assistance of a Cordis ExoSeal extra arterial vascular plug. IMPRESSION: 1. No evidence of active hemorrhage. 2. 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 3. Prophylactic coil embolization of the gastroduodenal artery. Signed, Criselda Peaches, MD, [facility] Radiology Electronically Signed   By: Jacqulynn Cadet M.D.   On: 09/07/2018 22:32   Ct Angio Abd/pel W/ And/or W/o  Result Date: 09/11/2018 CLINICAL DATA:  Status postoperative right popliteal and tibial embolectomy for embolic disease. Known bilateral iliac artery aneurysmal disease. EXAM: CT ANGIOGRAPHY ABDOMEN AND PELVIS WITH CONTRAST TECHNIQUE: Multidetector CT imaging of the abdomen and pelvis was performed using the standard protocol during bolus administration of intravenous contrast. Multiplanar reconstructed images and MIPs were obtained and reviewed to evaluate the vascular anatomy. CONTRAST:  145m ISOVUE-370 IOPAMIDOL (ISOVUE-370) INJECTION 76% COMPARISON:  CTA of the abdomen on 01/30/2018 and CT of the abdomen and pelvis with contrast on 12/19/2017. FINDINGS: VASCULAR Aorta: Stable mild dilatation of the infrarenal aorta with maximal diameter  of 3.1 cm. Celiac: Mild origin stenosis of 25-30%. Distal branches are patent. Status post transcatheter coil embolization of the gastroduodenal artery on 09/07/2018. Hepatic arteries are normally patent. SMA: Normally patent. Renals: Bilateral single renal arteries demonstrate normal patency. IMA: Normally patent. Inflow: Fusiform aneurysmal disease present of bilateral common iliac arteries as previously demonstrated. Maximal caliber of the right common iliac artery is 2.8-2.9 cm and maximal caliber of the right common iliac artery is 2.7 cm. These measurements are stable since prior studies. The significant change since the prior studies is a much more prominent and new component of mural thrombus in both common iliac arteries, right greater than left. In the proximal right common iliac artery, mural thrombus narrows the aneurysmal lumen by at least 50% in its narrowest portion. Eccentric mural thrombus is present at the origin of the right common iliac artery and also distally just prior to the common iliac bifurcation without significant luminal stenosis. Aneurysmal disease again noted of bilateral internal iliac arteries with the left measuring approximately 2.5 cm in greatest diameter and the right measuring 1.5 cm. Both internal iliac arteries also demonstrate significant mural thrombus without occlusion. Mural thrombus is stable to slightly more prominent compared to the 12/19/2017 study. Bilateral external iliac arteries are normally patent and demonstrate diffuse arteriomegaly with maximum caliber 1.5 cm. Proximal Outflow: Bilateral common femoral arteries demonstrate dilatation with the left measuring up to 2.1  cm in diameter. Mural thrombus is present along the posterior lumen with approximately 50% reduction in luminal diameter. No thrombus is seen extending into proximal SFA or profunda femoral arteries. On the right side the common femoral artery measures up to 1.8 cm in the femoral bifurcation  appears widely patent. Veins: Delayed venous phase imaging shows patent venous structures without evidence of thrombus. There is some probable mass effect of ectatic external iliac arteries on the distal aspects of bilateral external iliac veins without evidence of associated deep venous thrombosis or significant collateral vein formation. Review of the MIP images confirms the above findings. NON-VASCULAR Lower chest: Small bilateral pleural effusions with bibasilar atelectasis. Hepatobiliary: No focal liver abnormality is seen. No gallstones, gallbladder wall thickening, or biliary dilatation. Pancreas: Since the March study, there is a change in appearance of the pancreas on the venous phase of imaging. The tail of the pancreas now appears progressively atrophic with associated pancreatic ductal dilatation up to approximately 7 mm. At the junction of the body and head of the pancreas, there is suggestion a potential ill-defined low-attenuation mass that could measure as much as 3 cm in AP diameter. Borders are very difficult to delineate by CT. There is no associated biliary obstruction. 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 Recommend eventual correlation with MRI of the abdomen with and without gadolinium. There may be a window of time recommended to wait to perform MRI after placement of the gastroduodenal artery Ruby coils. This can be further investigated based on manufacturer recommendation. If there may be a significant delay in ability to perform MRI, consider endoscopic ultrasound to investigate the pancreas. Spleen: Normal in size without focal abnormality. Adrenals/Urinary Tract: Adrenal glands are unremarkable. Kidneys are normal, without renal calculi, focal lesion, or hydronephrosis. Bladder is unremarkable. Stomach/Bowel: Gastric fold thickening and prominent fold thickening of the duodenum may be consistent with known peptic ulcer disease. No evidence of bowel obstruction or free  intraperitoneal air. No focal abscess identified. Lymphatic: No enlarged lymph nodes identified. Reproductive: Prostate is unremarkable. Other: Diffuse anasarca of the body wall and edema of the mesenteric and retroperitoneal fat likely relate to low albumin and malnutrition. Musculoskeletal: Moderate degenerative disc disease at L5-S1. IMPRESSION: VASCULAR 1. 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 No complications evident after embolization. 2. Stable mild aneurysmal disease of the distal abdominal aorta measuring 3.1 cm. 3. 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 The major change since prior imaging is significant increase in mural thrombus within the dilated common iliac arteries bilaterally, especially on the right. This is most likely the source distal emboli to the right distal leg. Mural thrombus in the internal iliac artery aneurysms is stable to slightly more prominent. 4. Stable diffuse enlargement bilateral external iliac and common femoral arteries. The left common femoral artery does contain significant posterior wall mural thrombus. NON-VASCULAR 1. The most significant nonvascular finding is potential development of a mass of the pancreas at the juncture of the head and body causing progressive atrophy of the tail of the pancreas with associated pancreatic ductal dilatation. On the venous phase of imaging, delineation of the potential mass is quite vague but the region of mass may measure as much as 3 cm. There may be some issue with immediate performance of MRI in the setting recent coiling of the gastroduodenal artery even if the coils are MRI compatible. This can be checked with the manufacturer recommendation. If MRI can be performed immediately, an MRI of  the abdomen with and without gadolinium would be helpful for further evaluation. If there is going to be significant delay, consider EUS characterization of the  pancreas. 2. Diffuse body wall anasarca and edema of the mesenteric fat and retroperitoneum likely reflects low albumin and malnutrition. 3. Prominent gastric folds and duodenal folds without evidence of bowel obstruction or perforation. Electronically Signed   By: Aletta Edouard M.D.   On: 09/11/2018 12:36     LOS: 11 days   Signature  Lala Lund M.D on 09/14/2018 at 11:38 AM   -  To page go to [email or web address] - password [facility]

## 2018-09-14 NOTE — Progress Notes (Signed)
[place] for Heparin Indication: Arterial embolism  Allergies  Allergen Reactions  . Lisinopril Anaphylaxis    Patient Measurements: Height: 5\' 11"  (180.3 cm) Weight: 147 lb 0.8 oz (66.7 kg) IBW/kg (Calculated) : 75.3 Heparin Dosing Weight: 66.7  Vital Signs: Temp: 99.2 F (37.3 C) (12/24 0907) Temp Source: Oral (12/24 0907) BP: 132/105 (12/24 0907) Pulse Rate: 86 (12/24 0907)  Labs: Recent Labs    09/11/18 1624  09/12/18 0415  09/12/18 1928 09/12/18 2318 09/13/18 0352  09/13/18 1830 09/14/18 0214 09/14/18 0938  HGB  --   --  8.6*   < > 8.7* 8.7* 8.5*  --   --  8.6*  --   HCT  --   --  25.9*   < > 25.8* 26.2* 24.9*  --   --  25.8*  --   PLT  --   --  210   < > 240  --  246  --   --  290  --   APTT 30  --  30  --   --   --   --   --   --   --   --   HEPARINUNFRC  --    < > <0.10*  --  <0.10*  --   --    < > 0.11* 0.21* 0.35  CREATININE  --   --  0.75  --   --   --  0.49*  --   --  0.62  --    < > = values in this interval not displayed.    Estimated Creatinine Clearance: 96.1 mL/min (by C-G formula based on SCr of 0.62 mg/dL).   Assessment: 57 yo male with artial embolus s/p embolectomy. Patient had development of hematoma with compartment syndrome as well as hemorrhagic shock secondary to a bleeding duodenal ulcer. Plan is for EUS with FNA on 12/26 to assess pancreatic mass.  Heparin level is therapeutic at 0.35 on 1200 units/hr. Had nose bleed last night and reporting dark stools, Hgb low stable in 8s, platelets are normal.   Goal of Therapy:  Heparin level 0.3-0.5 units/mL Monitor platelets by anticoagulation protocol: Yes   Plan:  Continue heparin drip at 1200 units/hr Confirmatory heparin level this afternoon Daily heparin level and CBC Close monitoring of Hgb Heparin drip scheduled to stop on 12/26 at 07:00 per discussion with GI   Renold Genta, PharmD, BCPS Clinical Pharmacist Clinical phone for  09/14/2018 until 3p is [phone] 09/14/2018 10:55 AM  **Pharmacist phone directory can now be found on [email or web address] listed under [place]**

## 2018-09-14 NOTE — Progress Notes (Signed)
[place] for Heparin Indication: Arterial embolism  Allergies  Allergen Reactions  . Lisinopril Anaphylaxis    Patient Measurements: Height: 5\' 11"  (180.3 cm) Weight: 147 lb 0.8 oz (66.7 kg) IBW/kg (Calculated) : 75.3 Heparin Dosing Weight: 66.7  Vital Signs: Temp: 99.4 F (37.4 C) (12/23 2231) Temp Source: Oral (12/23 2231) BP: 141/100 (12/23 2231) Pulse Rate: 85 (12/23 2231)  Labs: Recent Labs    09/11/18 0410 09/11/18 1624  09/12/18 0415  09/12/18 1928 09/12/18 2318 09/13/18 0352 09/13/18 0918 09/13/18 1830 09/14/18 0214  HGB 10.0*  --   --  8.6*   < > 8.7* 8.7* 8.5*  --   --  8.6*  HCT 29.3*  --   --  25.9*   < > 25.8* 26.2* 24.9*  --   --  25.8*  PLT 208  --   --  210   < > 240  --  246  --   --  290  APTT  --  30  --  30  --   --   --   --   --   --   --   HEPARINUNFRC  --   --    < > <0.10*  --  <0.10*  --   --  <0.10* 0.11* 0.21*  CREATININE 0.60*  --   --  0.75  --   --   --  0.49*  --   --   --    < > = values in this interval not displayed.    Estimated Creatinine Clearance: 96.1 mL/min (A) (by C-G formula based on SCr of 0.49 mg/dL (L)).   Medical History: Past Medical History:  Diagnosis Date  . Back pain, chronic   . Diabetes ([place])   . Dilated cardiomyopathy ([place])    Echo 2/18: EF 45-50, diff HK, Gr 1 DD, trivial AI/TR  . HLD (hyperlipidemia)   . Hypertension   . IDDM (insulin dependent diabetes mellitus) ([place])   . Ruptured lumbar disc    Assessment: 57 yo male with artial embolus s/p embolectomy. Patient had development of hematoma with compartment syndrome as well as hemorrhagic shock secondary to a bleeding duodenal ulcer.   Heparin level <0.1, Hgb stable at 8.7. However, patient still having bloody stools. Repeat Hgb pending for midnight. D/W MD on call, will hold on increasing heparin until results of midnight H&H are back. If stable, will increase cautiously.  12/24 AM update: Hgb remains low  but stable this AM at 8.6, will proceed with another cautious rate increase due to low heparin level  Goal of Therapy:  Heparin level 0.3-0.5 units/mL Monitor platelets by anticoagulation protocol: Yes   Plan:  Inc heparin to 1200 units/hr Heparin off at 0700 for procedure this AM F/U heparin re-start after procedure Close monitoring of Hgb  Narda Bonds, PharmD, BCPS Clinical Pharmacist Phone: [phone]

## 2018-09-14 NOTE — Progress Notes (Signed)
Daily Rounding Note  09/14/2018, 11:23 AM  LOS: 11 days   SUBJECTIVE:   Chief complaint: Duodenal bulb ulcer. No BMs reported.   No melena.  No stools today.  Eating 100% of his solid meals.  OBJECTIVE:         Vital signs in last 24 hours:    Temp:  [98.8 F (37.1 C)-99.9 F (37.7 C)] 99.2 F (37.3 C) (12/24 0907) Pulse Rate:  [82-97] 86 (12/24 0907) Resp:  [17-22] 22 (12/24 0907) BP: (130-141)/(90-105) 132/105 (12/24 0907) SpO2:  [99 %-100 %] 100 % (12/24 0907) Last BM Date: 09/13/18 Filed Weights   09/03/18 1650  Weight: 66.7 kg   Patient was sleeping quietly, I did not examine him.  No labored breathing.  Intake/Output from previous day: 12/23 0701 - 12/24 0700 In: 1000 [P.O.:600; I.V.:250; IV Piggyback:150] Out: 5400 [Urine:5400]  Intake/Output this shift: No intake/output data recorded.  Lab Results: Recent Labs    09/12/18 1928 09/12/18 2318 09/13/18 0352 09/14/18 0214  WBC 7.5  --  7.9 7.9  HGB 8.7* 8.7* 8.5* 8.6*  HCT 25.8* 26.2* 24.9* 25.8*  PLT 240  --  246 290   BMET Recent Labs    09/12/18 0415 09/13/18 0352 09/14/18 0214  NA 135 138 134*  K 3.4* 3.4* 3.6  CL 104 100 95*  CO2 _0 GLUCOSE 297* 254* 263*  BUN 10 6 <5*  CREATININE 0.75 0.49* 0.62  CALCIUM 7.1* 7.4* 7.6*   LFT Recent Labs    09/12/18 0415 09/13/18 0352 09/14/18 0214  PROT 3.7* 4.2* 4.8*  ALBUMIN 1.2* 1.4* 1.6*  AST 20 26 32  ALT _1 ALKPHOS 52 53 59  BILITOT 0.5 0.6 0.8   PT/INR No results for input(s): LABPROT, INR in the last 72 hours. Hepatitis Panel No results for input(s): HEPBSAG, HCVAB, HEPAIGM, HEPBIGM in the last 72 hours.  Studies/Results: No results found.   Scheduled Meds: . atorvastatin  20 mg Oral Daily  . chlorhexidine gluconate (MEDLINE KIT)  15 mL Mouth Rinse BID  . insulin aspart  0-9 Units Subcutaneous TID WC  . lidocaine  15 mL Oral Once  . mouth rinse  15 mL  Mouth Rinse BID  . pantoprazole  40 mg Oral BID  . tamsulosin  0.4 mg Oral Daily   Continuous Infusions: . sodium chloride    . heparin 1,200 Units/hr (09/14/18 0600)   PRN Meds:.sodium chloride, ipratropium-albuterol, LORazepam, ondansetron **OR** ondansetron (ZOFRAN) IV, oxyCODONE-acetaminophen, polyethylene glycol, senna-docusate, sodium chloride flush   ASSESMENT:   *     Pancreatic mass.   EUS with FNA planned on 09/16/2018 at 1330 for further evaluation. [id number] 61.  *      GI bleed.  CGE.  Bloody stool. EGD 09/08/18: severe esophagitis, duodenal bulb ulcer and overlying clot treated with hemospray.  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 On BID Po PPI.  Serum H. pylori IgA positive, IgG positive, IgM negative.   Note that stool H. pylori antigen test will not be accurate if he is on PPI, so will need to wait on this study.    *     Anemia acute due to blood loss, suspect chronic anemia is baseline.  Over the course of the hospitalization received 10 U PRBCs.    *    Status post popliteal and tibial artery embolectomy 4/13., subsequent fasciotomy with evacuation of hematoma 12/15.  Repeat right popliteal  and tibial embolectomy with closure of medial and lateral fasciotomy 12/20.   PLAN   *   Note the patient is currently n.p.o. for planned TEE today.  Please remember to restart regular diet after this.  I will order clear liquids for breakfast on Thursday but n.p.o. after that.    Inesha Sow  09/14/2018, 11:23 AM Phone [phone] 

## 2018-09-15 LAB — CBC
HEMATOCRIT: 22.8 % — AB (ref 39.0–52.0)
Hemoglobin: 7.3 g/dL — ABNORMAL LOW (ref 13.0–17.0)
MCH: 27.3 pg (ref 26.0–34.0)
MCHC: 32 g/dL (ref 30.0–36.0)
MCV: 85.4 fL (ref 80.0–100.0)
Platelets: 330 10*3/uL (ref 150–400)
RBC: 2.67 MIL/uL — ABNORMAL LOW (ref 4.22–5.81)
RDW: 14.2 % (ref 11.5–15.5)
WBC: 12 10*3/uL — AB (ref 4.0–10.5)
nRBC: 0 % (ref 0.0–0.2)

## 2018-09-15 LAB — COMPREHENSIVE METABOLIC PANEL
ALT: 31 U/L (ref 0–44)
AST: 38 U/L (ref 15–41)
Albumin: 1.6 g/dL — ABNORMAL LOW (ref 3.5–5.0)
Alkaline Phosphatase: 53 U/L (ref 38–126)
Anion gap: 9 (ref 5–15)
BUN: 29 mg/dL — ABNORMAL HIGH (ref 6–20)
CHLORIDE: 96 mmol/L — AB (ref 98–111)
CO2: 30 mmol/L (ref 22–32)
Calcium: 7.4 mg/dL — ABNORMAL LOW (ref 8.9–10.3)
Creatinine, Ser: 0.74 mg/dL (ref 0.61–1.24)
GFR calc Af Amer: >60 mL/min (ref >60)
GFR calc non Af Amer: >60 mL/min (ref >60)
Glucose, Bld: 311 mg/dL — ABNORMAL HIGH (ref 70–99)
Potassium: 3.7 mmol/L (ref 3.5–5.1)
Sodium: 135 mmol/L (ref 135–145)
Total Bilirubin: 0.7 mg/dL (ref 0.3–1.2)
Total Protein: 4.6 g/dL — ABNORMAL LOW (ref 6.5–8.1)

## 2018-09-15 LAB — PREPARE RBC (CROSSMATCH)

## 2018-09-15 LAB — HEMOGLOBIN AND HEMATOCRIT, BLOOD
HCT: 20.1 % — ABNORMAL LOW (ref 39.0–52.0)
Hemoglobin: 6.5 g/dL — CL (ref 13.0–17.0)

## 2018-09-15 LAB — GLUCOSE, CAPILLARY
Glucose-Capillary: 273 mg/dL — ABNORMAL HIGH (ref 70–99)
Glucose-Capillary: 195 mg/dL — ABNORMAL HIGH (ref 70–99)
Glucose-Capillary: 153 mg/dL — ABNORMAL HIGH (ref 70–99)
Glucose-Capillary: 93 mg/dL (ref 70–99)

## 2018-09-15 LAB — HEPARIN LEVEL (UNFRACTIONATED): Heparin Unfractionated: 0.42 IU/mL (ref 0.30–0.70)

## 2018-09-15 MED ORDER — PANTOPRAZOLE SODIUM 40 MG IV SOLR
40.0000 mg | Freq: Two times a day (BID) | INTRAVENOUS | Status: DC
  Administered 2018-09-15 – 2018-09-21 (×13): 40 mg via INTRAVENOUS
  Filled 2018-09-15 (×13): qty 40

## 2018-09-15 MED ORDER — ZOLPIDEM TARTRATE 5 MG PO TABS
5.0000 mg | ORAL_TABLET | Freq: Once | ORAL | Status: AC
  Administered 2018-09-15: 5 mg via ORAL
  Filled 2018-09-15: qty 1

## 2018-09-15 MED ORDER — SODIUM CHLORIDE 0.9% IV SOLUTION
Freq: Once | INTRAVENOUS | Status: AC
  Administered 2018-09-15: 23:00:00 via INTRAVENOUS

## 2018-09-15 MED ORDER — FAMOTIDINE 20 MG PO TABS
20.0000 mg | ORAL_TABLET | Freq: Two times a day (BID) | ORAL | Status: DC | PRN
  Administered 2018-09-15 – 2018-09-22 (×2): 20 mg via ORAL
  Filled 2018-09-15 (×2): qty 1

## 2018-09-15 MED ORDER — SODIUM CHLORIDE 0.9% IV SOLUTION
Freq: Once | INTRAVENOUS | Status: AC
  Administered 2018-09-15: 15:00:00 via INTRAVENOUS

## 2018-09-15 NOTE — Progress Notes (Signed)
CRITICAL VALUE ALERT  Critical Value:  Hgb: 7.3   Date & Time Notied:  09/15/18 0530  Provider Notified: Dr. Baltazar Najjar   Orders Received/Actions taken: RN waiting for new orders .

## 2018-09-15 NOTE — Progress Notes (Addendum)
PROGRESS NOTE        PATIENT DETAILS Name: Ryan Medina Age: 57 y.o. Sex: male Date of Birth: May 19, 1961 Admit Date: 09/03/2018 Admitting Physician [name], MD [id number], No Pcp Per  Brief Narrative: Patient is a 57 y.o. male with history of cocaine use, chronic systolic heart failure presented to the hospital on 12/13 with a right ischemic leg secondary to acute popliteal and tibial embolus, underwent embolectomy on 12/13 and subsequently was placed on a heparin drip.  He unfortunately developed a acute right lower leg hematoma with compartment syndrome requiring fasciotomy on 12/15.  Further hospital course was complicated by development of hemorrhagic shock with acute blood loss anemia secondary to a bleeding duodenal ulcer-requiring ICU transfer-intubation for airway protection and IR evaluation with mesenteric angiogram and GDA embolization.  Upon stability he was transferred back to the triad hospitalist service on 12/19.  See below for further details.  Subjective:  Patient in bed, appears comfortable, denies any headache, no fever, no chest pain or pressure, no shortness of breath , no abdominal pain but had few episodes of hematemesis last night and maroon stool last night on 09/14/2018. No focal weakness.     Assessment/Plan:  Right ischemic leg secondary to popliteal artery/tibial artery embolism-s/p embolectomy on 87/68 complicated by right lower extremity hematoma with compartment syndrome requiring fasciotomy on 12/15:   Source of embolization unclear, required fasciotomy on 09/05/2018 by vascular surgery status post closure of fasciotomy on 09/10/2018.  His course was complicated by large hematoma in the right leg along with significant life-threatening GI bleed. Case discussed with vascular surgeon Dr. Monica Martinez who wants to continue anticoagulation as much as possible.  Also TEE requested on 09/11/2018 to figure out etiology of  his arterial embolus will likely be done after 09/16/2018 EUS due to severe esophagitis reported on EGD done this admission.  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  Kindly see anticoagulation below.  Upper GI bleeding with hemorrhagic shock and acute blood loss anemia: Developed hemorrhagic shock, was seen by GI, required multiple PRBC transfusions last transfusion on 09/09/2018.  Was seen by GI and eventually underwent gastroduodenal artery embolization on 09/07/2018.  Total 8 units of packed RBC, 1 unit of FFP and 1 unit of platelets transfused around 09/08/2018. GI note noted from 09/10/2018 (okay with heparin challenge).   Cautiously started on heparin drip on 09/12/2018 with stable H&H unfortunately on 09/14/2018 he had few episodes of hematemesis again with some drop in H&H after which heparin drip has been held, rescheduled IV PPI, placed on clear liquid diet, transfuse 1 more unit of packed RBC on 09/15/2018 and monitor H&H.  Case discussed with GI physician on-call Dr. Collene Mares on 09/15/2018, already due for EUS on 09/16/2018.   Urinary retention.  On Foley and Flomax.  Foley removed on 09/15/2018.  Will monitor bladder scans.    Acute hypoxic respiratory failure: Intubated when he developed hemorrhagic shock secondary to GI bleeding, successfully extubated-currently stable on 2 L of oxygen via nasal cannula.  Chronic systolic heart failure (EF 45-50% by TTE on 09/04/2018): Reasonably well compensated-follow weights, volume status, electrolytes closely.  Cocaine use/tobacco use: Counseled-I am still not sure if he has any intention of quitting.  3 cm AAA: Stable for follow-up in the outpatient setting  New pancreatic mass noted on CT scan on  09/11/2018.  MRI noted & confirms mass, CA 19.9 has been elevated, GI seeing and planning to do EUS on 09/16/2018.  Insulin-dependent DM-2: Last A1c on 12/2 was 18.2.  Increase Lantus for better control continue  sliding scale.  CBG (last 3)  Recent Labs    09/14/18 1852 09/14/18 2124 09/15/18 0815  GLUCAP 214* 172* 273*      DVT Prophylaxis: Heparin full dose started on 09/10/2018 by vascular surgery  Code Status: Full code   Family Communication: None at bedside  Disposition Plan: Remain inpatient-requires several more days of hospitalization before consideration of discharge  Antimicrobial agents: Anti-infectives (From admission, onward)   Start     Dose/Rate Route Frequency Ordered Stop   09/10/18 2130  ceFAZolin (ANCEF) IVPB 2g/100 mL premix     2 g 200 mL/hr over 30 Minutes Intravenous Every 8 hours 09/10/18 1634 09/11/18 0700   09/10/18 0800  ceFAZolin (ANCEF) IVPB 1 g/50 mL premix    Note to Pharmacy:  Send with pt to OR   1 g 100 mL/hr over 30 Minutes Intravenous On call 09/09/18 0756 09/10/18 1407   09/07/18 1300  erythromycin 250 mg in sodium chloride 0.9 % 100 mL IVPB     250 mg 100 mL/hr over 60 Minutes Intravenous Once 09/07/18 1125 09/07/18 2351   09/06/18 0000  ceFAZolin (ANCEF) IVPB 1 g/50 mL premix    Note to Pharmacy:  Send with pt to OR   1 g 100 mL/hr over 30 Minutes Intravenous On call 09/05/18 0849 09/05/18 0952   09/03/18 2245  ceFAZolin (ANCEF) IVPB 2g/100 mL premix     2 g 200 mL/hr over 30 Minutes Intravenous Every 8 hours 09/03/18 2236 09/04/18 1444   09/03/18 1615  ceFAZolin (ANCEF) IVPB 2g/100 mL premix  Status:  Discontinued     2 g 200 mL/hr over 30 Minutes Intravenous On call to O.R. 09/03/18 1600 09/03/18 2035      Procedures:  12/17>>Mesenteric angiogram and coil embolization of the GDA  12/17>>EGD  12/15>> 1.  Evacuation hematoma right leg 2.  Evacuation of lymphocele right leg 3.  4 compartment fasciotomy 4.  Placement of VAC (medial and lateral)  12/13>> 1.  Ultrasound-guided access to the left common femoral artery 2.  Aortogram with bilateral iliac arteriogram 3.  Selective catheterization of the right external iliac  artery with right lower extremity runoff  MRI Abd.  Pancreatic mass.  EUS - 12/26     CONSULTS:  pulmonary/intensive care, vascular surgery and IR, GI, Cards  Time spent: 40 minutes-Greater than 50% of this time was spent in counseling, explanation of diagnosis, planning of further management, and coordination of care.  MEDICATIONS: Scheduled Meds: . sodium chloride   Intravenous Once  . atorvastatin  20 mg Oral Daily  . chlorhexidine gluconate (MEDLINE KIT)  15 mL Mouth Rinse BID  . insulin aspart  0-9 Units Subcutaneous TID WC  . insulin glargine  10 Units Subcutaneous Daily  . lidocaine  15 mL Oral Once  . mouth rinse  15 mL Mouth Rinse BID  . pantoprazole (PROTONIX) IV  40 mg Intravenous Q12H  . tamsulosin  0.4 mg Oral Daily   Continuous Infusions: . sodium chloride     PRN Meds:.sodium chloride, alum & mag hydroxide-simeth, famotidine, ipratropium-albuterol, LORazepam, [DISCONTINUED] ondansetron **OR** ondansetron (ZOFRAN) IV, oxyCODONE-acetaminophen, polyethylene glycol, senna-docusate, sodium chloride flush   PHYSICAL EXAM: Vital signs: Vitals:   09/14/18 2013 09/15/18 0454 09/15/18 0455 09/15/18 0512  BP: (!) 114/91 Marland Kitchen)  86/66 90/63 103/77  Pulse: (!) 106 (!) 124 (!) 121 (!) 119  Resp: 18 18    Temp: 98.6 F (37 C) 98.7 F (37.1 C)    TempSrc: Oral Oral    SpO2: 100% 100%    Weight:      Height:       Filed Weights   09/03/18 1650  Weight: 66.7 kg   Body mass index is 20.51 kg/m.   Exam  Awake Alert, Oriented X 3, No new F.N deficits, Normal affect [place].AT,PERRAL Supple Neck,No JVD, No cervical lymphadenopathy appriciated.  Symmetrical Chest wall movement, Good air movement bilaterally, CTAB RRR,No Gallops, Rubs or new Murmurs, No Parasternal Heave +ve B.Sounds, Abd Soft, No tenderness, No organomegaly appriciated, No rebound - guarding or rigidity. No Cyanosis, Clubbing or edema, No new Rash or bruise  I have personally reviewed following labs  and imaging studies  LABORATORY DATA: CBC: Recent Labs  Lab 09/08/18 1454 09/08/18 2326  09/09/18 0711  09/12/18 1322 09/12/18 1928 09/12/18 2318 09/13/18 0352 09/14/18 0214 09/15/18 0427  WBC 12.3* 13.4*  --  12.4*   < > 7.2 7.5  --  7.9 7.9 12.0*  NEUTROABS 11.2* 11.5*  --  11.5*  --   --   --   --   --   --   --   HGB 7.9* 7.0*   < > 7.3*   < > 8.5* 8.7* 8.7* 8.5* 8.6* 7.3*  HCT 24.5* 21.4*   < > 22.3*   < > 25.5* 25.8* 26.2* 24.9* 25.8* 22.8*  MCV 85.4 83.9  --  84.8   < > 82.5 84.6  --  84.4 83.5 85.4  PLT 165 146*  --  140*   < > 213 240  --  246 290 330   < > = values in this interval not displayed.    Basic Metabolic Panel: Recent Labs  Lab 09/09/18 0437  09/10/18 0412 09/11/18 0410 09/12/18 0415 09/13/18 0352 09/14/18 0214 09/15/18 0427  NA  --    < > 133* 133* 135 138 134* 135  K  --    < > 3.3* 3.6 3.4* 3.4* 3.6 3.7  CL  --    < > 102 100 104 100 95* 96*  CO2  --    < > 24 23 23 26 27 30   GLUCOSE  --    < > 221* 294* 297* 254* 263* 311*  BUN  --    < > 10 11 10 6  <5* 29*  CREATININE  --    < > 0.57* 0.60* 0.75 0.49* 0.62 0.74  CALCIUM  --    < > 7.0* 7.0* 7.1* 7.4* 7.6* 7.4*  MG 1.8  --  1.8  --  1.6* 1.7  --   --   PHOS 2.0*  --  2.3*  --   --   --   --   --    < > = values in this interval not displayed.    GFR: Estimated Creatinine Clearance: 96.1 mL/min (by C-G formula based on SCr of 0.74 mg/dL).  Liver Function Tests: Recent Labs  Lab 09/10/18 0412 09/12/18 0415 09/13/18 0352 09/14/18 0214 09/15/18 0427  AST 23 20 26  32 38  ALT 19 17 19 23 31   ALKPHOS 52 52 53 59 53  BILITOT 0.6 0.5 0.6 0.8 0.7  PROT 3.8* 3.7* 4.2* 4.8* 4.6*  ALBUMIN 1.3* 1.2* 1.4* 1.6* 1.6*   No results for input(s): LIPASE,  AMYLASE in the last 168 hours. No results for input(s): AMMONIA in the last 168 hours.  Coagulation Profile: No results for input(s): INR, PROTIME in the last 168 hours.  Cardiac Enzymes: No results for input(s): CKTOTAL, CKMB, CKMBINDEX,  TROPONINI in the last 168 hours.  BNP (last 3 results) No results for input(s): PROBNP in the last 8760 hours.  HbA1C: No results for input(s): HGBA1C in the last 72 hours.  CBG: Recent Labs  Lab 09/14/18 0830 09/14/18 1150 09/14/18 1852 09/14/18 2124 09/15/18 0815  GLUCAP 232* 203* 214* 172* 273*    Lipid Profile: No results for input(s): CHOL, HDL, LDLCALC, TRIG, CHOLHDL, LDLDIRECT in the last 72 hours.  Thyroid Function Tests: No results for input(s): TSH, T4TOTAL, FREET4, T3FREE, THYROIDAB in the last 72 hours.  Anemia Panel: No results for input(s): VITAMINB12, FOLATE, FERRITIN, TIBC, IRON, RETICCTPCT in the last 72 hours.  Urine analysis:    Component Value Date/Time   COLORURINE STRAW (A) 08/22/2018 2054   APPEARANCEUR CLEAR 08/22/2018 2054   LABSPEC 1.027 08/22/2018 2054   PHURINE 6.0 08/22/2018 2054   GLUCOSEU >=500 (A) 08/22/2018 2054   HGBUR NEGATIVE 08/22/2018 2054   [place] NEGATIVE 08/22/2018 2054   KETONESUR 80 (A) 08/22/2018 2054   PROTEINUR NEGATIVE 08/22/2018 2054   UROBILINOGEN 4.0 (H) 12/05/2016 0921   NITRITE NEGATIVE 08/22/2018 2054   LEUKOCYTESUR NEGATIVE 08/22/2018 2054    Sepsis Labs: Lactic Acid, Venous    Component Value Date/Time   LATICACIDVEN 1.7 09/07/2018 1600    MICROBIOLOGY: Recent Results (from the past 240 hour(s))  Surgical pcr screen     Status: None   Collection Time: 09/10/18  3:39 AM  Result Value Ref Range Status   MRSA, PCR NEGATIVE NEGATIVE Final   Staphylococcus aureus NEGATIVE NEGATIVE Final    Comment: (NOTE) The Xpert SA Assay (FDA approved for NASAL specimens in patients 63 years of age and older), is one component of a comprehensive surveillance program. It is not intended to diagnose infection nor to guide or monitor treatment. Performed at [facility], [place] [address], [place], [place] [address]     RADIOLOGY STUDIES/RESULTS: Dg Abd 1 View  Result Date: 09/07/2018 CLINICAL DATA:  OG  tube placement. EXAM: ABDOMEN - 1 VIEW COMPARISON:  CT abdomen pelvis 08/22/2018. FINDINGS: OG tube is in place with the side port and tip in the body of the stomach. The stomach is distended. IMPRESSION: OG tube in good position. Electronically Signed   By: Inge Rise M.D.   On: 09/07/2018 13:31   Ct Angio Chest Pe W Or Wo Contrast  Result Date: 09/06/2018 CLINICAL DATA:  Evaluate thoracic aortic aneurysm. EXAM: CT ANGIOGRAPHY CHEST WITH CONTRAST TECHNIQUE: Multidetector CT imaging of the chest was performed using the standard protocol during bolus administration of intravenous contrast. Multiplanar CT image reconstructions and MIPs were obtained to evaluate the vascular anatomy. CONTRAST:  65 mL ISOVUE-370 IOPAMIDOL (ISOVUE-370) INJECTION 76% COMPARISON:  01/30/2018; 10/14/2016; 04/25/2026 FINDINGS: Vascular Findings: No evidence of thoracic aortic aneurysm with measurements as follows. No definite evidence of thoracic aortic dissection or periaortic stranding on this nongated examination. Bovine configuration of the aortic arch. The branch vessels of the aortic arch appear widely patent throughout their imaged courses. The descending thoracic aorta is of normal caliber and widely patent without hemodynamically significant stenosis. There is a very minimal amount of atherosclerotic plaque involving the proximal aspect of the descending thoracic aorta. Cardiomegaly.  No pericardial effusion. Although this examination was not  tailored for the evaluation the pulmonary arteries, there are no discrete filling defects within the central pulmonary arterial tree to suggest central pulmonary embolism. Borderline enlarged caliber the main pulmonary artery measuring 32 mm in diameter (image 67, series 6) ------------------------------------------------------------- Thoracic aortic measurements: Sinotubular junction 33 mm as measured in greatest oblique coronal dimension. Proximal ascending aorta 35 mm as measured  in greatest oblique axial dimension at the level of the main pulmonary artery (image 70, series 6); and approximately 36 mm in greatest oblique short axis coronal diameter (coronal image 48, series 9). Aortic arch aorta 28 mm as measured in greatest oblique sagittal dimension. Proximal descending thoracic aorta 30 mm as measured in greatest oblique axial dimension at the level of the main pulmonary artery. Distal descending thoracic aorta 29 mm as measured in greatest oblique axial dimension at the level of the diaphragmatic hiatus. Review of the MIP images confirms the above findings. ------------------------------------------------------------- Non-Vascular Findings: Mediastinum/Lymph Nodes: Prominent right hilar lymph nodes are not enlarged by size criteria with index right suprahilar lymph node measuring 0.8 cm in greatest short axis diameter (image 71, series 6) and index right infrahilar lymph node measuring 0.7 cm (image 88). No definitive bulky mediastinal, hilar or axillary lymphadenopathy. Lungs/Pleura: Interval development of small bilateral pleural effusions with worsening bibasilar consolidative opacities and associated air bronchograms, right greater than left. Interval development of an approximately 1.0 x 0.5 cm subpleural consolidative opacity within the right upper lobe (image 36, series 7). Unchanged punctate (approximately 3 mm) calcified granuloma within the left lower lobe (image 75, series 3). Evaluation for additional pulmonary nodules is degraded secondary to bibasilar airspace opacities. The central pulmonary airways appear widely patent. Upper abdomen: Fluid distention of the mid and distal aspects of the esophagus with marked distension of the imaged superior aspect of the stomach. Musculoskeletal: No acute or aggressive osseous abnormalities. There is incomplete fusion involving the spinous processes of multiple thoracic vertebral bodies, unchanged. Regional soft tissues appear normal.  Normal appearance of the thyroid gland. IMPRESSION: 1. No evidence of thoracic aortic aneurysm or dissection. 2. Small bilateral effusions with associated bibasilar consolidative opacities with associated air bronchograms, atelectasis versus infiltrate. Follow-up chest radiograph in 4-6 weeks after treatment is recommended to ensure resolution. 3. Fluid distension of the mid and distal aspects of the esophagus as well as marked distension of the imaged superior aspect stomach - correlation for gastric outlet obstructive symptoms is advised. 4. Cardiomegaly with enlargement of the caliber the main pulmonary artery as could be seen in the setting of pulmonary arterial hypertension. Electronically Signed   By: Sandi Mariscal M.D.   On: 09/06/2018 09:20   Mr Abdomen W Wo Contrast  Result Date: 09/12/2018 CLINICAL DATA:  Suspicion of pancreatic neck mass on CT. EXAM: MRI ABDOMEN WITHOUT AND WITH CONTRAST TECHNIQUE: Multiplanar multisequence MR imaging of the abdomen was performed both before and after the administration of intravenous contrast. CONTRAST:  6 cc Gadavist COMPARISON:  CT of 1 day prior. Stone study of 08/22/2018. More remote CT of 12/19/2017. FINDINGS: Moderatemultifactorial degradation, including respiratory motion and susceptibility artifact from embolization coils. Lower chest: Bilateral pleural fluid with bibasilar airspace disease. Normal heart size. Hepatobiliary: Left hepatic lobe 9 mm cyst. Too small to characterizea right hepatic lobe lesion is most likely a cyst at 2 mm. Normal gallbladder, without biliary ductal dilatation. Pancreas: An area of soft tissue fullness and restricted diffusion is identified within the pancreatic neck, corresponding to the abnormality on CT. This measures on the order  of 3.9 x 3.6 cm on image 76/12. Also 3.6 x 3.6 cm on image 35/31. Results in pancreatic duct dilatation within the upstream body and tail on image 17/11. Involves the celiac and its branches including  on image 75/12. Spleen:  Grossly within normal limits. Adrenals/Urinary Tract: Normal adrenal glands. Grossly normal kidneys, without hydronephrosis. Stomach/Bowel: Gastric distension with proximal wall and fold thickening, including on image 73/12. Grossly normal abdominal bowel loops. Vascular/Lymphatic: Normal caliber of the aorta. 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 Limited evaluation for abdominal adenopathy. Other:  Small volume abdominal ascites. Musculoskeletal: No acute osseous abnormality. IMPRESSION: 1. Moderate motion and susceptibility artifact degradation throughout. 2. Confirmation of a pancreatic neck mass, most consistent with adenocarcinoma. Involvement of the celiac and its branches, most consistent with a non-operative lesion. 3. Consider endoscopic ultrasound sampling. 4. Gastric wall and fold thickening, suggesting gastritis. 5. Small bilateral pleural effusions with adjacent airspace disease. Electronically Signed   By: Abigail Miyamoto M.D.   On: 09/12/2018 03:04   Ir Angiogram Visceral Selective  Result Date: 09/07/2018 INDICATION: 57 year old male with large volume upper GI bleed secondary to a very large duodenal ulcer. Patient underwent endoscopic intervention but continues to require pressors and large volume transfusion. He presents for mesenteric arteriography and embolization of any active bleeding and prophylactic embolization of the gastroduodenal artery. EXAM: IR ULTRASOUND GUIDANCE VASC ACCESS RIGHT; ADDITIONAL ARTERIOGRAPHY; IR EMBO ART VEN HEMORR LYMPH EXTRAV INC GUIDE ROADMAPPING; SELECTIVE VISCERAL ARTERIOGRAPHY 1. Ultrasound-guided vascular access 2. Catheterization of the left gastric artery where it arises directly from the aorta with arteriogram 3. Catheterization of the celiac artery with arteriogram 4. Catheterization of the common hepatic artery with arteriogram 5. Catheterization of the gastroduodenal artery with arteriogram 6. Coil embolization of  the gastroduodenal artery 7. Catheterization of the superior mesenteric artery with arteriogram MEDICATIONS: None ANESTHESIA/SEDATION: Patient intubated and on propofol drip CONTRAST:  70 mL Isovue 370 FLUOROSCOPY TIME:  Fluoroscopy Time: 11 minutes 30 seconds (487 mGy). COMPLICATIONS: None immediate. PROCEDURE: Informed consent was obtained from the patient following explanation of the procedure, risks, benefits and alternatives. The patient understands, agrees and consents for the procedure. All questions were addressed. A time out was performed prior to the initiation of the procedure. Maximal barrier sterile technique utilized including caps, mask, sterile gowns, sterile gloves, large sterile drape, hand hygiene, and Betadine prep. The right common femoral artery was interrogated with ultrasound and found to be widely patent. An image was obtained and stored for the medical record. Local anesthesia was attained by infiltration with 1% lidocaine. A small dermatotomy was made. Under real-time sonographic guidance, the vessel was punctured with a 21 gauge micropuncture needle. Using standard technique, the initial micro needle was exchanged over a 0.018 micro wire for a transitional 4 [place] micro sheath. The micro sheath was then exchanged over a 0.035 wire for a 5 French vascular sheath. A C2 cobra catheter was advanced in the abdominal aorta over a Bentson wire. The catheter was used to select the first vessel arising from the ventral aorta. Contrast was injected. This is a left gastric artery arising directly from the aorta. Anatomy is normal. No evidence of active bleeding. The Cobra catheter was then used to select the origin of the celiac artery. Arteriography was performed. The splenic artery is small in caliber. The hepatic artery demonstrates normal hepatic arterial anatomy. The gastroduodenal artery is slightly irregular with spasm in the proximal segment. No evidence of active extravasation. A glidewire  was successfully advanced  into the common hepatic artery and the C2 cobra catheter was advanced into the common hepatic artery. Additional arteriography was performed in multiple obliquities. Again, the proximal gastroduodenal artery is irregular and spasmodic. No evidence of dissection, aneurysm or active bleeding. A Lantern microcatheter was then advanced over a Fathom 16 wire and used to select the distal aspect of the gastroduodenal artery. Arteriography was performed. The gastroepiploic arteries are widely patent. No evidence of active bleeding. Coil embolization was then performed using a 4 mm Ruby POD detachable micro coil followed by a 45 cm Ruby packing coil. Post embolization arteriography demonstrates complete occlusion of the vessel with stasis in the proximal segment. The Lantern microcatheter was brought back into the common hepatic artery and arteriography was performed. Again, complete occlusion of the gastroduodenal artery. The right gastric artery is visualized. No evidence of active bleeding from the right gastric artery. The microcatheter was removed. The C2 cobra catheter was used to select the superior mesenteric artery. Arteriography was performed. Patent pancreaticoduodenal arcade supplying the gastroepiploic artery. No evidence of active hemorrhage. The C2 cobra catheter was removed. A limited right common femoral arteriogram was performed confirming common femoral arterial access. Hemostasis was attained with the assistance of a Cordis ExoSeal extra arterial vascular plug. IMPRESSION: 1. No evidence of active hemorrhage. 2. 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 3. Prophylactic coil embolization of the gastroduodenal artery. Signed, Criselda Peaches, MD, [facility] Electronically Signed   By: Jacqulynn Cadet M.D.   On: 09/07/2018 22:32   Ir Angiogram Visceral Selective  Result Date:  09/07/2018 INDICATION: 57 year old male with large volume upper GI bleed secondary to a very large duodenal ulcer. Patient underwent endoscopic intervention but continues to require pressors and large volume transfusion. He presents for mesenteric arteriography and embolization of any active bleeding and prophylactic embolization of the gastroduodenal artery. EXAM: IR ULTRASOUND GUIDANCE VASC ACCESS RIGHT; ADDITIONAL ARTERIOGRAPHY; IR EMBO ART VEN HEMORR LYMPH EXTRAV INC GUIDE ROADMAPPING; SELECTIVE VISCERAL ARTERIOGRAPHY 1. Ultrasound-guided vascular access 2. Catheterization of the left gastric artery where it arises directly from the aorta with arteriogram 3. Catheterization of the celiac artery with arteriogram 4. Catheterization of the common hepatic artery with arteriogram 5. Catheterization of the gastroduodenal artery with arteriogram 6. Coil embolization of the gastroduodenal artery 7. Catheterization of the superior mesenteric artery with arteriogram MEDICATIONS: None ANESTHESIA/SEDATION: Patient intubated and on propofol drip CONTRAST:  70 mL Isovue 370 FLUOROSCOPY TIME:  Fluoroscopy Time: 11 minutes 30 seconds (487 mGy). COMPLICATIONS: None immediate. PROCEDURE: Informed consent was obtained from the patient following explanation of the procedure, risks, benefits and alternatives. The patient understands, agrees and consents for the procedure. All questions were addressed. A time out was performed prior to the initiation of the procedure. Maximal barrier sterile technique utilized including caps, mask, sterile gowns, sterile gloves, large sterile drape, hand hygiene, and Betadine prep. The right common femoral artery was interrogated with ultrasound and found to be widely patent. An image was obtained and stored for the medical record. Local anesthesia was attained by infiltration with 1% lidocaine. A small dermatotomy was made. Under real-time sonographic guidance, the vessel was punctured with a 21  gauge micropuncture needle. Using standard technique, the initial micro needle was exchanged over a 0.018 micro wire for a transitional 4 [place] micro sheath. The micro sheath was then exchanged over a 0.035 wire for a 5 French vascular sheath. A C2 cobra catheter was advanced in the abdominal aorta over  a Bentson wire. The catheter was used to select the first vessel arising from the ventral aorta. Contrast was injected. This is a left gastric artery arising directly from the aorta. Anatomy is normal. No evidence of active bleeding. The Cobra catheter was then used to select the origin of the celiac artery. Arteriography was performed. The splenic artery is small in caliber. The hepatic artery demonstrates normal hepatic arterial anatomy. The gastroduodenal artery is slightly irregular with spasm in the proximal segment. No evidence of active extravasation. A glidewire was successfully advanced into the common hepatic artery and the C2 cobra catheter was advanced into the common hepatic artery. Additional arteriography was performed in multiple obliquities. Again, the proximal gastroduodenal artery is irregular and spasmodic. No evidence of dissection, aneurysm or active bleeding. A Lantern microcatheter was then advanced over a Fathom 16 wire and used to select the distal aspect of the gastroduodenal artery. Arteriography was performed. The gastroepiploic arteries are widely patent. No evidence of active bleeding. Coil embolization was then performed using a 4 mm Ruby POD detachable micro coil followed by a 45 cm Ruby packing coil. Post embolization arteriography demonstrates complete occlusion of the vessel with stasis in the proximal segment. The Lantern microcatheter was brought back into the common hepatic artery and arteriography was performed. Again, complete occlusion of the gastroduodenal artery. The right gastric artery is visualized. No evidence of active bleeding from the right gastric artery. The  microcatheter was removed. The C2 cobra catheter was used to select the superior mesenteric artery. Arteriography was performed. Patent pancreaticoduodenal arcade supplying the gastroepiploic artery. No evidence of active hemorrhage. The C2 cobra catheter was removed. A limited right common femoral arteriogram was performed confirming common femoral arterial access. Hemostasis was attained with the assistance of a Cordis ExoSeal extra arterial vascular plug. IMPRESSION: 1. No evidence of active hemorrhage. 2. 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 3. Prophylactic coil embolization of the gastroduodenal artery. Signed, Criselda Peaches, MD, [facility] Radiology Electronically Signed   By: Jacqulynn Cadet M.D.   On: 09/07/2018 22:32   Ir Angiogram Selective Each Additional Vessel  Result Date: 09/07/2018 INDICATION: 57 year old male with large volume upper GI bleed secondary to a very large duodenal ulcer. Patient underwent endoscopic intervention but continues to require pressors and large volume transfusion. He presents for mesenteric arteriography and embolization of any active bleeding and prophylactic embolization of the gastroduodenal artery. EXAM: IR ULTRASOUND GUIDANCE VASC ACCESS RIGHT; ADDITIONAL ARTERIOGRAPHY; IR EMBO ART VEN HEMORR LYMPH EXTRAV INC GUIDE ROADMAPPING; SELECTIVE VISCERAL ARTERIOGRAPHY 1. Ultrasound-guided vascular access 2. Catheterization of the left gastric artery where it arises directly from the aorta with arteriogram 3. Catheterization of the celiac artery with arteriogram 4. Catheterization of the common hepatic artery with arteriogram 5. Catheterization of the gastroduodenal artery with arteriogram 6. Coil embolization of the gastroduodenal artery 7. Catheterization of the superior mesenteric artery with arteriogram MEDICATIONS: None ANESTHESIA/SEDATION: Patient intubated and on propofol drip  CONTRAST:  70 mL Isovue 370 FLUOROSCOPY TIME:  Fluoroscopy Time: 11 minutes 30 seconds (487 mGy). COMPLICATIONS: None immediate. PROCEDURE: Informed consent was obtained from the patient following explanation of the procedure, risks, benefits and alternatives. The patient understands, agrees and consents for the procedure. All questions were addressed. A time out was performed prior to the initiation of the procedure. Maximal barrier sterile technique utilized including caps, mask, sterile gowns, sterile gloves, large sterile drape, hand hygiene, and Betadine prep. The right common femoral artery was  interrogated with ultrasound and found to be widely patent. An image was obtained and stored for the medical record. Local anesthesia was attained by infiltration with 1% lidocaine. A small dermatotomy was made. Under real-time sonographic guidance, the vessel was punctured with a 21 gauge micropuncture needle. Using standard technique, the initial micro needle was exchanged over a 0.018 micro wire for a transitional 4 [place] micro sheath. The micro sheath was then exchanged over a 0.035 wire for a 5 French vascular sheath. A C2 cobra catheter was advanced in the abdominal aorta over a Bentson wire. The catheter was used to select the first vessel arising from the ventral aorta. Contrast was injected. This is a left gastric artery arising directly from the aorta. Anatomy is normal. No evidence of active bleeding. The Cobra catheter was then used to select the origin of the celiac artery. Arteriography was performed. The splenic artery is small in caliber. The hepatic artery demonstrates normal hepatic arterial anatomy. The gastroduodenal artery is slightly irregular with spasm in the proximal segment. No evidence of active extravasation. A glidewire was successfully advanced into the common hepatic artery and the C2 cobra catheter was advanced into the common hepatic artery. Additional arteriography was performed in  multiple obliquities. Again, the proximal gastroduodenal artery is irregular and spasmodic. No evidence of dissection, aneurysm or active bleeding. A Lantern microcatheter was then advanced over a Fathom 16 wire and used to select the distal aspect of the gastroduodenal artery. Arteriography was performed. The gastroepiploic arteries are widely patent. No evidence of active bleeding. Coil embolization was then performed using a 4 mm Ruby POD detachable micro coil followed by a 45 cm Ruby packing coil. Post embolization arteriography demonstrates complete occlusion of the vessel with stasis in the proximal segment. The Lantern microcatheter was brought back into the common hepatic artery and arteriography was performed. Again, complete occlusion of the gastroduodenal artery. The right gastric artery is visualized. No evidence of active bleeding from the right gastric artery. The microcatheter was removed. The C2 cobra catheter was used to select the superior mesenteric artery. Arteriography was performed. Patent pancreaticoduodenal arcade supplying the gastroepiploic artery. No evidence of active hemorrhage. The C2 cobra catheter was removed. A limited right common femoral arteriogram was performed confirming common femoral arterial access. Hemostasis was attained with the assistance of a Cordis ExoSeal extra arterial vascular plug. IMPRESSION: 1. No evidence of active hemorrhage. 2. 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 3. Prophylactic coil embolization of the gastroduodenal artery. Signed, Criselda Peaches, MD, [facility] Radiology Electronically Signed   By: Jacqulynn Cadet M.D.   On: 09/07/2018 22:32   Ir Angiogram Selective Each Additional Vessel  Result Date: 09/07/2018 INDICATION: 57 year old male with large volume upper GI bleed secondary to a very large duodenal ulcer. Patient underwent endoscopic intervention but  continues to require pressors and large volume transfusion. He presents for mesenteric arteriography and embolization of any active bleeding and prophylactic embolization of the gastroduodenal artery. EXAM: IR ULTRASOUND GUIDANCE VASC ACCESS RIGHT; ADDITIONAL ARTERIOGRAPHY; IR EMBO ART VEN HEMORR LYMPH EXTRAV INC GUIDE ROADMAPPING; SELECTIVE VISCERAL ARTERIOGRAPHY 1. Ultrasound-guided vascular access 2. Catheterization of the left gastric artery where it arises directly from the aorta with arteriogram 3. Catheterization of the celiac artery with arteriogram 4. Catheterization of the common hepatic artery with arteriogram 5. Catheterization of the gastroduodenal artery with arteriogram 6. Coil embolization of the gastroduodenal artery 7. Catheterization of the superior mesenteric artery with arteriogram MEDICATIONS: None  ANESTHESIA/SEDATION: Patient intubated and on propofol drip CONTRAST:  70 mL Isovue 370 FLUOROSCOPY TIME:  Fluoroscopy Time: 11 minutes 30 seconds (487 mGy). COMPLICATIONS: None immediate. PROCEDURE: Informed consent was obtained from the patient following explanation of the procedure, risks, benefits and alternatives. The patient understands, agrees and consents for the procedure. All questions were addressed. A time out was performed prior to the initiation of the procedure. Maximal barrier sterile technique utilized including caps, mask, sterile gowns, sterile gloves, large sterile drape, hand hygiene, and Betadine prep. The right common femoral artery was interrogated with ultrasound and found to be widely patent. An image was obtained and stored for the medical record. Local anesthesia was attained by infiltration with 1% lidocaine. A small dermatotomy was made. Under real-time sonographic guidance, the vessel was punctured with a 21 gauge micropuncture needle. Using standard technique, the initial micro needle was exchanged over a 0.018 micro wire for a transitional 4 [place] micro sheath. The  micro sheath was then exchanged over a 0.035 wire for a 5 French vascular sheath. A C2 cobra catheter was advanced in the abdominal aorta over a Bentson wire. The catheter was used to select the first vessel arising from the ventral aorta. Contrast was injected. This is a left gastric artery arising directly from the aorta. Anatomy is normal. No evidence of active bleeding. The Cobra catheter was then used to select the origin of the celiac artery. Arteriography was performed. The splenic artery is small in caliber. The hepatic artery demonstrates normal hepatic arterial anatomy. The gastroduodenal artery is slightly irregular with spasm in the proximal segment. No evidence of active extravasation. A glidewire was successfully advanced into the common hepatic artery and the C2 cobra catheter was advanced into the common hepatic artery. Additional arteriography was performed in multiple obliquities. Again, the proximal gastroduodenal artery is irregular and spasmodic. No evidence of dissection, aneurysm or active bleeding. A Lantern microcatheter was then advanced over a Fathom 16 wire and used to select the distal aspect of the gastroduodenal artery. Arteriography was performed. The gastroepiploic arteries are widely patent. No evidence of active bleeding. Coil embolization was then performed using a 4 mm Ruby POD detachable micro coil followed by a 45 cm Ruby packing coil. Post embolization arteriography demonstrates complete occlusion of the vessel with stasis in the proximal segment. The Lantern microcatheter was brought back into the common hepatic artery and arteriography was performed. Again, complete occlusion of the gastroduodenal artery. The right gastric artery is visualized. No evidence of active bleeding from the right gastric artery. The microcatheter was removed. The C2 cobra catheter was used to select the superior mesenteric artery. Arteriography was performed. Patent pancreaticoduodenal arcade  supplying the gastroepiploic artery. No evidence of active hemorrhage. The C2 cobra catheter was removed. A limited right common femoral arteriogram was performed confirming common femoral arterial access. Hemostasis was attained with the assistance of a Cordis ExoSeal extra arterial vascular plug. IMPRESSION: 1. No evidence of active hemorrhage. 2. 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 3. Prophylactic coil embolization of the gastroduodenal artery. Signed, Criselda Peaches, MD, [facility] Radiology Electronically Signed   By: Jacqulynn Cadet M.D.   On: 09/07/2018 22:32   Ir US Guide Vasc Access Right  Result Date: 09/07/2018 INDICATION: 57 year old male with large volume upper GI bleed secondary to a very large duodenal ulcer. Patient underwent endoscopic intervention but continues to require pressors and large volume transfusion. He presents for mesenteric arteriography and embolization of  any active bleeding and prophylactic embolization of the gastroduodenal artery. EXAM: IR ULTRASOUND GUIDANCE VASC ACCESS RIGHT; ADDITIONAL ARTERIOGRAPHY; IR EMBO ART VEN HEMORR LYMPH EXTRAV INC GUIDE ROADMAPPING; SELECTIVE VISCERAL ARTERIOGRAPHY 1. Ultrasound-guided vascular access 2. Catheterization of the left gastric artery where it arises directly from the aorta with arteriogram 3. Catheterization of the celiac artery with arteriogram 4. Catheterization of the common hepatic artery with arteriogram 5. Catheterization of the gastroduodenal artery with arteriogram 6. Coil embolization of the gastroduodenal artery 7. Catheterization of the superior mesenteric artery with arteriogram MEDICATIONS: None ANESTHESIA/SEDATION: Patient intubated and on propofol drip CONTRAST:  70 mL Isovue 370 FLUOROSCOPY TIME:  Fluoroscopy Time: 11 minutes 30 seconds (487 mGy). COMPLICATIONS: None immediate. PROCEDURE: Informed consent was obtained from the  patient following explanation of the procedure, risks, benefits and alternatives. The patient understands, agrees and consents for the procedure. All questions were addressed. A time out was performed prior to the initiation of the procedure. Maximal barrier sterile technique utilized including caps, mask, sterile gowns, sterile gloves, large sterile drape, hand hygiene, and Betadine prep. The right common femoral artery was interrogated with ultrasound and found to be widely patent. An image was obtained and stored for the medical record. Local anesthesia was attained by infiltration with 1% lidocaine. A small dermatotomy was made. Under real-time sonographic guidance, the vessel was punctured with a 21 gauge micropuncture needle. Using standard technique, the initial micro needle was exchanged over a 0.018 micro wire for a transitional 4 [place] micro sheath. The micro sheath was then exchanged over a 0.035 wire for a 5 French vascular sheath. A C2 cobra catheter was advanced in the abdominal aorta over a Bentson wire. The catheter was used to select the first vessel arising from the ventral aorta. Contrast was injected. This is a left gastric artery arising directly from the aorta. Anatomy is normal. No evidence of active bleeding. The Cobra catheter was then used to select the origin of the celiac artery. Arteriography was performed. The splenic artery is small in caliber. The hepatic artery demonstrates normal hepatic arterial anatomy. The gastroduodenal artery is slightly irregular with spasm in the proximal segment. No evidence of active extravasation. A glidewire was successfully advanced into the common hepatic artery and the C2 cobra catheter was advanced into the common hepatic artery. Additional arteriography was performed in multiple obliquities. Again, the proximal gastroduodenal artery is irregular and spasmodic. No evidence of dissection, aneurysm or active bleeding. A Lantern microcatheter was then  advanced over a Fathom 16 wire and used to select the distal aspect of the gastroduodenal artery. Arteriography was performed. The gastroepiploic arteries are widely patent. No evidence of active bleeding. Coil embolization was then performed using a 4 mm Ruby POD detachable micro coil followed by a 45 cm Ruby packing coil. Post embolization arteriography demonstrates complete occlusion of the vessel with stasis in the proximal segment. The Lantern microcatheter was brought back into the common hepatic artery and arteriography was performed. Again, complete occlusion of the gastroduodenal artery. The right gastric artery is visualized. No evidence of active bleeding from the right gastric artery. The microcatheter was removed. The C2 cobra catheter was used to select the superior mesenteric artery. Arteriography was performed. Patent pancreaticoduodenal arcade supplying the gastroepiploic artery. No evidence of active hemorrhage. The C2 cobra catheter was removed. A limited right common femoral arteriogram was performed confirming common femoral arterial access. Hemostasis was attained with the assistance of a Cordis ExoSeal extra arterial vascular plug. IMPRESSION: 1. No evidence  of active hemorrhage. 2. 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 3. Prophylactic coil embolization of the gastroduodenal artery. Signed, Criselda Peaches, MD, [facility] Radiology Electronically Signed   By: Jacqulynn Cadet M.D.   On: 09/07/2018 22:32   Dg Chest Port 1 View  Result Date: 09/09/2018 CLINICAL DATA:  Respiratory failure EXAM: PORTABLE CHEST 1 VIEW COMPARISON:  09/07/2018 FINDINGS: Endotracheal tube and nasogastric catheter have been removed in the interval. Left jugular central line is again noted in the distal superior vena cava. The cardiac shadow is stable. The lungs are well aerated bilaterally with patchy bibasilar infiltrates and  small left pleural effusion. IMPRESSION: Stable bibasilar infiltrates and small left pleural effusion. Electronically Signed   By: Inez Catalina M.D.   On: 09/09/2018 07:17   Dg Chest Port 1 View  Result Date: 09/07/2018 CLINICAL DATA:  Status post fasciotomy and hematoma back UA shins in the right lower leg 2 days ago. Intubated patient. EXAM: PORTABLE CHEST 1 VIEW COMPARISON:  CT scan of the chest of September 06, 2018 FINDINGS: The lungs are well-expanded. There are patchy increased densities at both bases which are slightly more conspicuous today. There is a small right and and somewhat larger left pleural effusion. The heart and pulmonary vascularity are normal. The endotracheal tube tip projects 4.1 cm above the carina. The esophagogastric tube tip in proximal port project below the GE junction. The left internal jugular venous catheter tip projects over the midportion of the SVC. IMPRESSION: Bibasilar atelectasis or pneumonia. Small right pleural effusion and somewhat larger left pleural effusion. The support tubes are in reasonable position. Electronically Signed   By: David  Martinique M.D.   On: 09/07/2018 13:27   Vas [place] Burnard Bunting With/wo Tbi  Result Date: 09/14/2018 LOWER EXTREMITY DOPPLER STUDY Indications: Peripheral artery disease.  Vascular Interventions: Status post right popliteal - tibial embolectomy and                         fasciotomy. Comparison Study: No exam for comparison Performing Technologist: Birdena Crandall, [place] [facility]  Examination Guidelines: A complete evaluation includes at minimum, Doppler waveform signals and systolic blood pressure reading at the level of bilateral brachial, anterior tibial, and posterior tibial arteries, when vessel segments are accessible. Bilateral testing is considered an integral part of a complete examination. Photoelectric Plethysmograph (PPG) waveforms and toe systolic pressure readings are included as required and additional duplex testing as needed. Limited  examinations for reoccurring indications may be performed as noted.  ABI Findings: +--------+------------------+-----+----------+--------+ Right   Rt Pressure (mmHg)IndexWaveform  Comment  +--------+------------------+-----+----------+--------+ [id number]                    triphasic          +--------+------------------+-----+----------+--------+ PTA     179               1.36 triphasic          +--------+------------------+-----+----------+--------+ DP      152               1.15 monophasic         +--------+------------------+-----+----------+--------+ +--------+------------------+-----+---------+-------+ Left    Lt Pressure (mmHg)IndexWaveform Comment +--------+------------------+-----+---------+-------+ [id number]                    triphasic        +--------+------------------+-----+---------+-------+ PTA     176  1.33 triphasic        +--------+------------------+-----+---------+-------+ DP      162               1.23 triphasic        +--------+------------------+-----+---------+-------+ +-------+-----------+-----------+------------+------------+ ABI/TBIToday's ABIToday's TBIPrevious ABIPrevious TBI +-------+-----------+-----------+------------+------------+ Right  1.36                                           +-------+-----------+-----------+------------+------------+ Left   1.33                                           +-------+-----------+-----------+------------+------------+  Summary: Right: Resting right ankle-brachial index indicates noncompressible right lower extremity arteries. Left: Resting left ankle-brachial index indicates noncompressible left lower extremity arteries.  *See table(s) above for measurements and observations.    Preliminary    Ct Renal Stone Study  Result Date: 08/22/2018 CLINICAL DATA:  Left flank pain. Nausea vomiting for 4 days. Previous appendectomy. EXAM: CT ABDOMEN AND PELVIS WITHOUT  CONTRAST TECHNIQUE: Multidetector CT imaging of the abdomen and pelvis was performed following the standard protocol without IV contrast. COMPARISON:  CT scan December 19, 2017 FINDINGS: Lower chest: No acute abnormality. Hepatobiliary: No focal liver abnormality is seen. No gallstones, gallbladder wall thickening, or biliary dilatation. Pancreas: Unremarkable. No pancreatic ductal dilatation or surrounding inflammatory changes. Spleen: Normal in size without focal abnormality. Adrenals/Urinary Tract: Adrenal glands are normal. A 3 mm stone is again noted in the mid right kidney. No hydronephrosis or perinephric stranding. Bilateral extrarenal pelvises. No ureterectasis or ureteral stones noted. The bladder is unremarkable. Stomach/Bowel: The stomach is distended. Small bowel is normal. Moderate to severe fecal loading throughout the colon with a stool ball in the rectum. By report, the patient is status post appendectomy. 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 Atherosclerotic changes seen in the aorta, iliac vessels, and femoral vessels. Reproductive: Prostate is unremarkable. Other: No abdominal wall hernia or abnormality. No abdominopelvic ascites. Musculoskeletal: No acute or significant osseous findings. IMPRESSION: 1. Nonobstructive 3 mm stone in the right kidney. No renal obstruction or other stones noted. 2. Gastric distention of uncertain etiology. 3. Moderate to severe fecal loading throughout the colon. 4. Atherosclerotic changes throughout the abdominal aorta and iliac vessels. 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 Recommend followup by ultrasound in 3 years. This recommendation  follows ACR consensus guidelines: White Paper of the ACR Incidental Findings Committee II on Vascular Findings. Joellyn Rued Radiol 2013; 57:903-833 Electronically Signed   By: Dorise Bullion III M.D   On: 08/22/2018 19:54   Ir Embo Art  [place] Guide Roadmapping  Result Date: 09/07/2018 INDICATION: 57 year old male with large volume upper GI bleed secondary to a very large duodenal ulcer. Patient underwent endoscopic intervention but continues to require pressors and large volume transfusion. He presents for mesenteric arteriography and embolization of any active bleeding and prophylactic embolization of the gastroduodenal artery. EXAM: IR ULTRASOUND GUIDANCE VASC ACCESS RIGHT; ADDITIONAL  ARTERIOGRAPHY; IR EMBO ART VEN HEMORR LYMPH EXTRAV INC GUIDE ROADMAPPING; SELECTIVE VISCERAL ARTERIOGRAPHY 1. Ultrasound-guided vascular access 2. Catheterization of the left gastric artery where it arises directly from the aorta with arteriogram 3. Catheterization of the celiac artery with arteriogram 4. Catheterization of the common hepatic artery with arteriogram 5. Catheterization of the gastroduodenal artery with arteriogram 6. Coil embolization of the gastroduodenal artery 7. Catheterization of the superior mesenteric artery with arteriogram MEDICATIONS: None ANESTHESIA/SEDATION: Patient intubated and on propofol drip CONTRAST:  70 mL Isovue 370 FLUOROSCOPY TIME:  Fluoroscopy Time: 11 minutes 30 seconds (487 mGy). COMPLICATIONS: None immediate. PROCEDURE: Informed consent was obtained from the patient following explanation of the procedure, risks, benefits and alternatives. The patient understands, agrees and consents for the procedure. All questions were addressed. A time out was performed prior to the initiation of the procedure. Maximal barrier sterile technique utilized including caps, mask, sterile gowns, sterile gloves, large sterile drape, hand hygiene, and Betadine prep. The right common  femoral artery was interrogated with ultrasound and found to be widely patent. An image was obtained and stored for the medical record. Local anesthesia was attained by infiltration with 1% lidocaine. A small dermatotomy was made. Under real-time sonographic guidance, the vessel was punctured with a 21 gauge micropuncture needle. Using standard technique, the initial micro needle was exchanged over a 0.018 micro wire for a transitional 4 [place] micro sheath. The micro sheath was then exchanged over a 0.035 wire for a 5 French vascular sheath. A C2 cobra catheter was advanced in the abdominal aorta over a Bentson wire. The catheter was used to select the first vessel arising from the ventral aorta. Contrast was injected. This is a left gastric artery arising directly from the aorta. Anatomy is normal. No evidence of active bleeding. The Cobra catheter was then used to select the origin of the celiac artery. Arteriography was performed. The splenic artery is small in caliber. The hepatic artery demonstrates normal hepatic arterial anatomy. The gastroduodenal artery is slightly irregular with spasm in the proximal segment. No evidence of active extravasation. A glidewire was successfully advanced into the common hepatic artery and the C2 cobra catheter was advanced into the common hepatic artery. Additional arteriography was performed in multiple obliquities. Again, the proximal gastroduodenal artery is irregular and spasmodic. No evidence of dissection, aneurysm or active bleeding. A Lantern microcatheter was then advanced over a Fathom 16 wire and used to select the distal aspect of the gastroduodenal artery. Arteriography was performed. The gastroepiploic arteries are widely patent. No evidence of active bleeding. Coil embolization was then performed using a 4 mm Ruby POD detachable micro coil followed by a 45 cm Ruby packing coil. Post embolization arteriography demonstrates complete occlusion of the vessel with  stasis in the proximal segment. The Lantern microcatheter was brought back into the common hepatic artery and arteriography was performed. Again, complete occlusion of the gastroduodenal artery. The right gastric artery is visualized. No evidence of active bleeding from the right gastric artery. The microcatheter was removed. The C2 cobra catheter was used to select the superior mesenteric artery. Arteriography was performed. Patent pancreaticoduodenal arcade supplying the gastroepiploic artery. No evidence of active hemorrhage. The C2 cobra catheter was removed. A limited right common femoral arteriogram was performed confirming common femoral arterial access. Hemostasis was attained with the assistance of a Cordis ExoSeal extra arterial vascular plug. IMPRESSION: 1. No evidence of active hemorrhage. 2. 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 3. Prophylactic coil embolization of the gastroduodenal  artery. Signed, Criselda Peaches, MD, [place] [facility] Radiology Electronically Signed   By: Jacqulynn Cadet M.D.   On: 09/07/2018 22:32   Ct Angio Abd/pel W/ And/or W/o  Result Date: 09/11/2018 CLINICAL DATA:  Status postoperative right popliteal and tibial embolectomy for embolic disease. Known bilateral iliac artery aneurysmal disease. EXAM: CT ANGIOGRAPHY ABDOMEN AND PELVIS WITH CONTRAST TECHNIQUE: Multidetector CT imaging of the abdomen and pelvis was performed using the standard protocol during bolus administration of intravenous contrast. Multiplanar reconstructed images and MIPs were obtained and reviewed to evaluate the vascular anatomy. CONTRAST:  163m ISOVUE-370 IOPAMIDOL (ISOVUE-370) INJECTION 76% COMPARISON:  CTA of the abdomen on 01/30/2018 and CT of the abdomen and pelvis with contrast on 12/19/2017. FINDINGS: VASCULAR Aorta: Stable mild dilatation of the infrarenal aorta with maximal diameter of 3.1 cm. Celiac: Mild origin  stenosis of 25-30%. Distal branches are patent. Status post transcatheter coil embolization of the gastroduodenal artery on 09/07/2018. Hepatic arteries are normally patent. SMA: Normally patent. Renals: Bilateral single renal arteries demonstrate normal patency. IMA: Normally patent. Inflow: Fusiform aneurysmal disease present of bilateral common iliac arteries as previously demonstrated. Maximal caliber of the right common iliac artery is 2.8-2.9 cm and maximal caliber of the right common iliac artery is 2.7 cm. These measurements are stable since prior studies. The significant change since the prior studies is a much more prominent and new component of mural thrombus in both common iliac arteries, right greater than left. In the proximal right common iliac artery, mural thrombus narrows the aneurysmal lumen by at least 50% in its narrowest portion. Eccentric mural thrombus is present at the origin of the right common iliac artery and also distally just prior to the common iliac bifurcation without significant luminal stenosis. Aneurysmal disease again noted of bilateral internal iliac arteries with the left measuring approximately 2.5 cm in greatest diameter and the right measuring 1.5 cm. Both internal iliac arteries also demonstrate significant mural thrombus without occlusion. Mural thrombus is stable to slightly more prominent compared to the 12/19/2017 study. Bilateral external iliac arteries are normally patent and demonstrate diffuse arteriomegaly with maximum caliber 1.5 cm. Proximal Outflow: Bilateral common femoral arteries demonstrate dilatation with the left measuring up to 2.1 cm in diameter. Mural thrombus is present along the posterior lumen with approximately 50% reduction in luminal diameter. No thrombus is seen extending into proximal SFA or profunda femoral arteries. On the right side the common femoral artery measures up to 1.8 cm in the femoral bifurcation appears widely patent. Veins:  Delayed venous phase imaging shows patent venous structures without evidence of thrombus. There is some probable mass effect of ectatic external iliac arteries on the distal aspects of bilateral external iliac veins without evidence of associated deep venous thrombosis or significant collateral vein formation. Review of the MIP images confirms the above findings. NON-VASCULAR Lower chest: Small bilateral pleural effusions with bibasilar atelectasis. Hepatobiliary: No focal liver abnormality is seen. No gallstones, gallbladder wall thickening, or biliary dilatation. Pancreas: Since the March study, there is a change in appearance of the pancreas on the venous phase of imaging. The tail of the pancreas now appears progressively atrophic with associated pancreatic ductal dilatation up to approximately 7 mm. At the junction of the body and head of the pancreas, there is suggestion a potential ill-defined low-attenuation mass that could measure as much as 3 cm in AP diameter. Borders are very difficult to delineate by CT. There is no associated biliary obstruction. Findings are  concerning for pancreatic neoplasm. Recommend eventual correlation with MRI of the abdomen with and without gadolinium. There may be a window of time recommended to wait to perform MRI after placement of the gastroduodenal artery Ruby coils. This can be further investigated based on manufacturer recommendation. If there may be a significant delay in ability to perform MRI, consider endoscopic ultrasound to investigate the pancreas. Spleen: Normal in size without focal abnormality. Adrenals/Urinary Tract: Adrenal glands are unremarkable. Kidneys are normal, without renal calculi, focal lesion, or hydronephrosis. Bladder is unremarkable. Stomach/Bowel: Gastric fold thickening and prominent fold thickening of the duodenum may be consistent with known peptic ulcer disease. No evidence of bowel obstruction or free intraperitoneal air. No focal abscess  identified. Lymphatic: No enlarged lymph nodes identified. Reproductive: Prostate is unremarkable. Other: Diffuse anasarca of the body wall and edema of the mesenteric and retroperitoneal fat likely relate to low albumin and malnutrition. Musculoskeletal: Moderate degenerative disc disease at L5-S1. IMPRESSION: VASCULAR 1. 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 No complications evident after embolization. 2. Stable mild aneurysmal disease of the distal abdominal aorta measuring 3.1 cm. 3. 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 The major change since prior imaging is significant increase in mural thrombus within the dilated common iliac arteries bilaterally, especially on the right. This is most likely the source distal emboli to the right distal leg. Mural thrombus in the internal iliac artery aneurysms is stable to slightly more prominent. 4. Stable diffuse enlargement bilateral external iliac and common femoral arteries. The left common femoral artery does contain significant posterior wall mural thrombus. NON-VASCULAR 1. The most significant nonvascular finding is potential development of a mass of the pancreas at the juncture of the head and body causing progressive atrophy of the tail of the pancreas with associated pancreatic ductal dilatation. On the venous phase of imaging, delineation of the potential mass is quite vague but the region of mass may measure as much as 3 cm. There may be some issue with immediate performance of MRI in the setting recent coiling of the gastroduodenal artery even if the coils are MRI compatible. This can be checked with the manufacturer recommendation. If MRI can be performed immediately, an MRI of the abdomen with and without gadolinium would be helpful for further evaluation. If there is going to be significant delay, consider EUS characterization of the pancreas. 2. Diffuse body wall anasarca and  edema of the mesenteric fat and retroperitoneum likely reflects low albumin and malnutrition. 3. Prominent gastric folds and duodenal folds without evidence of bowel obstruction or perforation. Electronically Signed   By: Aletta Edouard M.D.   On: 09/11/2018 12:36     LOS: 12 days   Signature  Lala Lund M.D on 09/15/2018 at 9:32 AM   -  To page go to [email or web address] - password [facility]

## 2018-09-15 NOTE — Progress Notes (Signed)
CRITICAL VALUE ALERT  Critical Value:  Hgb. 6.5  Date & Time Notied:  09/15/18 2117  Provider Notified:Kirby,NP  Orders Received/Actions taken: yes

## 2018-09-15 NOTE — Progress Notes (Signed)
Hgb noted to be  7.3. MD notified about 2 maroon colored stools over night, MD ordered to stop heparin drip and she will deliver message to day team. Pt is currently a/o X 4, showing no signs of distress. RN to continue to monitor.

## 2018-09-15 NOTE — Progress Notes (Signed)
MD notified multiple times regarding pt's complaint of severe acid. No new orders placed. RN to continue to monitor.

## 2018-09-15 NOTE — Progress Notes (Signed)
CRITICAL VALUE ALERT  Critical Value:Hgb. 6.6  Date & Time Notied:  09/15/18 1957  Provider Notified:Kirby,NP  Orders Received/Actions taken: yes

## 2018-09-15 NOTE — Progress Notes (Signed)
    Subjective  - POD #5  Did not sleep much last night and is finally resting according to his wife who does not want him disturbed.  Heparin has been stopped secondary to decrease in hemoglobin.   Physical Exam:      RIGHT    LEFT    PRESSURE WAVEFORM  PRESSURE WAVEFORM  BRACHIAL 132 Triphasic BRACHIAL 131 Triphasic  DP 152 Monophasic DP 162 Triphasic  PT 179 Triphasic PT 176 Triphasic    RIGHT LEFT  ABI 1.36 1.33       Assessment/Plan:  POD #5  Vascular issues remained stable. Plans for EUS tomorrow Continue to monitor hemoglobin and treat appropriately  Wells Brabham 09/15/2018 4:04 PM --  Vitals:   09/15/18 1514 09/15/18 1541  BP: 97/66 105/73  Pulse: 100 (!) 105  Resp: 16 20  Temp: 99.7 F (37.6 C) 99.6 F (37.6 C)  SpO2: 100% 99%    Intake/Output Summary (Last 24 hours) at 09/15/2018 1604 Last data filed at 09/15/2018 1437 Gross per 24 hour  Intake -  Output 1430 ml  Net -1430 ml     Laboratory CBC    Component Value Date/Time   WBC 12.0 (H) 09/15/2018 0427   HGB 7.3 (L) 09/15/2018 0427   HCT 22.8 (L) 09/15/2018 0427   PLT 330 09/15/2018 0427    BMET    Component Value Date/Time   NA 135 09/15/2018 0427   K 3.7 09/15/2018 0427   CL 96 (L) 09/15/2018 0427   CO2 30 09/15/2018 0427   GLUCOSE 311 (H) 09/15/2018 0427   BUN 29 (H) 09/15/2018 0427   CREATININE 0.74 09/15/2018 0427   CREATININE 0.92 05/15/2016 1038   CALCIUM 7.4 (L) 09/15/2018 0427   GFRNONAA >60 09/15/2018 0427   GFRAA >60 09/15/2018 0427    COAG Lab Results  Component Value Date   INR 1.28 09/07/2018   INR 1.03 09/03/2018   INR 1.0 05/15/2016   No results found for: PTT  Antibiotics Anti-infectives (From admission, onward)   Start     Dose/Rate Route Frequency Ordered Stop   09/10/18 2130  ceFAZolin (ANCEF) IVPB 2g/100 mL premix     2 g 200 mL/hr over 30 Minutes Intravenous Every 8 hours 09/10/18 1634 09/11/18 0700   09/10/18 0800  ceFAZolin  (ANCEF) IVPB 1 g/50 mL premix    Note to Pharmacy:  Send with pt to OR   1 g 100 mL/hr over 30 Minutes Intravenous On call 09/09/18 0756 09/10/18 1407   09/07/18 1300  erythromycin 250 mg in sodium chloride 0.9 % 100 mL IVPB     250 mg 100 mL/hr over 60 Minutes Intravenous Once 09/07/18 1125 09/07/18 2351   09/06/18 0000  ceFAZolin (ANCEF) IVPB 1 g/50 mL premix    Note to Pharmacy:  Send with pt to OR   1 g 100 mL/hr over 30 Minutes Intravenous On call 09/05/18 0849 09/05/18 0952   09/03/18 2245  ceFAZolin (ANCEF) IVPB 2g/100 mL premix     2 g 200 mL/hr over 30 Minutes Intravenous Every 8 hours 09/03/18 2236 09/04/18 1444   09/03/18 1615  ceFAZolin (ANCEF) IVPB 2g/100 mL premix  Status:  Discontinued     2 g 200 mL/hr over 30 Minutes Intravenous On call to O.R. 09/03/18 1600 09/03/18 2035       V. Leia Alf, M.D. [facility] [place] Office: [phone] Pager:  [phone]

## 2018-09-16 ENCOUNTER — Inpatient Hospital Stay (HOSPITAL_COMMUNITY): Payer: Medicaid Other

## 2018-09-16 ENCOUNTER — Inpatient Hospital Stay (HOSPITAL_COMMUNITY): Payer: Medicaid Other | Admitting: Certified Registered Nurse Anesthetist

## 2018-09-16 ENCOUNTER — Encounter (HOSPITAL_COMMUNITY): Payer: Self-pay | Admitting: *Deleted

## 2018-09-16 ENCOUNTER — Encounter (HOSPITAL_COMMUNITY)
Admission: EM | Disposition: A | Discharge status: Skilled Nursing Facility | Payer: Self-pay | Source: Home / Self Care | Attending: Internal Medicine

## 2018-09-16 DIAGNOSIS — K264 Chronic or unspecified duodenal ulcer with hemorrhage: Secondary | ICD-10-CM | POA: Diagnosis not present

## 2018-09-16 DIAGNOSIS — K2970 Gastritis, unspecified, without bleeding: Secondary | ICD-10-CM

## 2018-09-16 HISTORY — PX: BIOPSY: SHX5522

## 2018-09-16 HISTORY — PX: TEE WITHOUT CARDIOVERSION: SHX5443

## 2018-09-16 HISTORY — PX: EUS: SHX5427

## 2018-09-16 HISTORY — PX: FINE NEEDLE ASPIRATION: SHX5430

## 2018-09-16 HISTORY — PX: ESOPHAGOGASTRODUODENOSCOPY (EGD) WITH PROPOFOL: SHX5813

## 2018-09-16 LAB — CBC
HEMATOCRIT: 27.4 % — AB (ref 39.0–52.0)
Hemoglobin: 9.1 g/dL — ABNORMAL LOW (ref 13.0–17.0)
MCH: 27.7 pg (ref 26.0–34.0)
MCHC: 33.2 g/dL (ref 30.0–36.0)
MCV: 83.5 fL (ref 80.0–100.0)
Platelets: 290 10*3/uL (ref 150–400)
RBC: 3.28 MIL/uL — ABNORMAL LOW (ref 4.22–5.81)
RDW: 14.9 % (ref 11.5–15.5)
WBC: 10.7 10*3/uL — ABNORMAL HIGH (ref 4.0–10.5)
nRBC: 0 % (ref 0.0–0.2)

## 2018-09-16 LAB — HEMOGLOBIN AND HEMATOCRIT, BLOOD
HCT: 20 % — ABNORMAL LOW (ref 39.0–52.0)
Hemoglobin: 6.6 g/dL — CL (ref 13.0–17.0)

## 2018-09-16 LAB — COMPREHENSIVE METABOLIC PANEL
ALT: 30 U/L (ref 0–44)
AST: 33 U/L (ref 15–41)
Albumin: 1.6 g/dL — ABNORMAL LOW (ref 3.5–5.0)
Alkaline Phosphatase: 49 U/L (ref 38–126)
Anion gap: 9 (ref 5–15)
BILIRUBIN TOTAL: 1 mg/dL (ref 0.3–1.2)
BUN: 15 mg/dL (ref 6–20)
CO2: 23 mmol/L (ref 22–32)
Calcium: 7.5 mg/dL — ABNORMAL LOW (ref 8.9–10.3)
Chloride: 102 mmol/L (ref 98–111)
Creatinine, Ser: 0.69 mg/dL (ref 0.61–1.24)
GFR calc Af Amer: >60 mL/min (ref >60)
GFR calc non Af Amer: >60 mL/min (ref >60)
Glucose, Bld: 138 mg/dL — ABNORMAL HIGH (ref 70–99)
Potassium: 3.8 mmol/L (ref 3.5–5.1)
Sodium: 134 mmol/L — ABNORMAL LOW (ref 135–145)
TOTAL PROTEIN: 4.4 g/dL — AB (ref 6.5–8.1)

## 2018-09-16 LAB — GLUCOSE, CAPILLARY
GLUCOSE-CAPILLARY: 95 mg/dL (ref 70–99)
Glucose-Capillary: 111 mg/dL — ABNORMAL HIGH (ref 70–99)
Glucose-Capillary: 118 mg/dL — ABNORMAL HIGH (ref 70–99)
Glucose-Capillary: 123 mg/dL — ABNORMAL HIGH (ref 70–99)
Glucose-Capillary: 80 mg/dL (ref 70–99)

## 2018-09-16 LAB — HEPARIN LEVEL (UNFRACTIONATED): Heparin Unfractionated: <0.1 IU/mL — ABNORMAL LOW (ref 0.30–0.70)

## 2018-09-16 LAB — MAGNESIUM: Magnesium: 1.7 mg/dL (ref 1.7–2.4)

## 2018-09-16 SURGERY — ESOPHAGEAL ENDOSCOPIC ULTRASOUND (EUS) RADIAL
Anesthesia: Monitor Anesthesia Care

## 2018-09-16 MED ORDER — FENTANYL CITRATE (PF) 100 MCG/2ML IJ SOLN
25.0000 ug | Freq: Once | INTRAMUSCULAR | Status: AC
  Administered 2018-09-16: 25 ug via INTRAVENOUS
  Filled 2018-09-16: qty 2

## 2018-09-16 MED ORDER — SODIUM CHLORIDE 0.9 % IV SOLN: INTRAVENOUS | Status: DC

## 2018-09-16 MED ORDER — PROPOFOL 10 MG/ML IV BOLUS
INTRAVENOUS | Status: DC | PRN
  Administered 2018-09-16: 70 mg via INTRAVENOUS
  Administered 2018-09-16 (×3): 20 mg via INTRAVENOUS

## 2018-09-16 MED ORDER — MORPHINE SULFATE (PF) 2 MG/ML IV SOLN
1.0000 mg | INTRAVENOUS | Status: DC | PRN
  Administered 2018-09-16 – 2018-10-16 (×68): 1 mg via INTRAVENOUS
  Filled 2018-09-16 (×73): qty 1

## 2018-09-16 MED ORDER — PHENYLEPHRINE 40 MCG/ML (10ML) SYRINGE FOR IV PUSH (FOR BLOOD PRESSURE SUPPORT)
PREFILLED_SYRINGE | INTRAVENOUS | Status: DC | PRN
  Administered 2018-09-16: 80 ug via INTRAVENOUS
  Administered 2018-09-16: 120 ug via INTRAVENOUS

## 2018-09-16 MED ORDER — SODIUM CHLORIDE (PF) 0.9 % IJ SOLN
INTRAMUSCULAR | Status: DC | PRN
  Administered 2018-09-16: 9 mL via INTRAVENOUS

## 2018-09-16 MED ORDER — SODIUM CHLORIDE 0.9 % IV SOLN
INTRAVENOUS | Status: DC | PRN
  Administered 2018-09-16: 40 ug/min via INTRAVENOUS

## 2018-09-16 MED ORDER — SUCCINYLCHOLINE CHLORIDE 200 MG/10ML IV SOSY
PREFILLED_SYRINGE | INTRAVENOUS | Status: DC | PRN
  Administered 2018-09-16: 120 mg via INTRAVENOUS

## 2018-09-16 MED ORDER — PROPOFOL 500 MG/50ML IV EMUL
INTRAVENOUS | Status: DC | PRN
  Administered 2018-09-16: 100 ug/kg/min via INTRAVENOUS

## 2018-09-16 MED ORDER — LACTATED RINGERS IV SOLN
INTRAVENOUS | Status: DC
  Administered 2018-09-16: 13:00:00 via INTRAVENOUS

## 2018-09-16 MED ORDER — LIDOCAINE 2% (20 MG/ML) 5 ML SYRINGE
INTRAMUSCULAR | Status: DC | PRN
  Administered 2018-09-16: 40 mg via INTRAVENOUS

## 2018-09-16 MED ORDER — ONDANSETRON HCL 4 MG/2ML IJ SOLN
INTRAMUSCULAR | Status: DC | PRN
  Administered 2018-09-16: 4 mg via INTRAVENOUS

## 2018-09-16 NOTE — Op Note (Signed)
[facility] Patient Name: Ryan Medina Procedure Date : 09/16/2018 MRN: [id number] Attending MD: Milus Banister , MD Date of Birth: 1961-03-27 CSN: [id number] Age: 57 Admit Type: Inpatient Procedure:                Upper EUS Indications:              Pancreatic neck mass on MRI, large duodenal bulb                            ulcer noted by EGD 2 weeks ago; he's received 13                            units of blood so far, 3 of them in the past 12-24                            hours. Providers:                Milus Banister, MD, Carlyn Reichert, RN, Cherylynn Ridges, Technician, Pearline Cables, CRNA Referring MD:              Medicines:                MAC converted to General Anesthesia when large                            volume gastric blood was noted Complications:            No immediate complications. Estimated blood loss:                            None. Estimated Blood Loss:     Estimated blood loss: none. Procedure:                Pre-Anesthesia Assessment:                           - Prior to the procedure, a History and Physical                            was performed, and patient medications and                            allergies were reviewed. The patient's tolerance of                            previous anesthesia was also reviewed. The risks                            and benefits of the procedure and the sedation                            options and risks were discussed with the patient.  All questions were answered, and informed consent                            was obtained. Prior Anticoagulants: The patient has                            taken heparin, last dose was day of procedure. ASA                            Grade Assessment: IV - A patient with severe                            systemic disease that is a constant threat to life.                            After reviewing the risks and  benefits, the patient                            was deemed in satisfactory condition to undergo the                            procedure.                           After obtaining informed consent, the endoscope was                            passed under direct vision. Throughout the                            procedure, the patient's blood pressure, pulse, and                            oxygen saturations were monitored continuously. The                            GF-[id number]-[id number] ([id number]) Olympus Radial EUS was                            introduced through the mouth, and advanced to the                            second part of duodenum. The [id number] ([id number])                            Olympus Linear EUS was introduced through the                            mouth, and advanced to the second part of duodenum.                            The [id number]-[id number] ([id number]) Olympus adult EGD was  introduced through the mouth, and advanced to the                            second part of duodenum. The upper EUS was                            accomplished without difficulty. The patient                            tolerated the procedure well. Scope In: Scope Out: Findings:      ENDOSCOPIC FINDING: :      1. There was a large amount of old blood in the stomach. After noticing       this, anesthesia team elected to protect his airway with ET intubation.      2. The previously noted duodenal bulb ulcer was again located. It was       still deeply cratered. The surrounding mucosa was quite friable and       intermittently oozing. The mucosa was firm and concerning for malignancy       and so I sampled it with forceps.      3. Reflux related esophagitis      EUS findings:      1. Following EGD above, I proceeded wtih EUS evaluation. There was a       large hypoechoic, heterogeneous mass involving the neck of the pancreas.       This measured 3.6cm by 2.1cm. The mass causes main  pancreatic duct       obstruction and resultant dilation (to 46m). The mass encases the celiac       trunk and abuts the SMA as well. I sampled the mass with three       transgastric passes using a 25 gauge EUS FNA needle.      2. One small (552m but suspicious peripancreatic lymphnode (not sampled)       was noted.      3. CBD was non dilated.      4. Limited views of the liver, spleen were normal. Impression:               - 3.6cm by 2.1cm mass in the neck of pancreas that                            surrounds the celiac trunk, obstructs the main                            pancreatic duct and appears to have also invaded                            the duodenal bulb created a large, malignant,                            friable ulcer. I biopsied the abnormal, firm mucosa                            surrounding the duodenal ulcer and sampled the  pancreatic mass with transgastric EUS FNA.                            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 Moderate Sedation:      none Recommendation:           - Return patient to hospital [facility] for ongoing care.                           - He has recieved 13 units of blood this admission,                            3 of them in the past 24 hours. He will continue to                            ooze from the duodenal ulcer that I suspect is                            malignant, related to the nearby large pancreatic                            mass. Judicious use of bloodthinners is strongly                            recommended.                           - Await final cytology/pathology reports; both                            hopefully available tomorrow. Procedure Code(s):        --- Professional ---                           [phone], Esophagogastroduodenoscopy, flexible,                            transoral; with transendoscopic ultrasound-guided                             intramural or transmural fine needle                            aspiration/biopsy(s), (includes endoscopic                            ultrasound examination limited to the esophagus,                            stomach or duodenum, and adjacent structures)                           [id number], 59, Esophagogastroduodenoscopy, flexible,  transoral; with biopsy, single or multiple Diagnosis Code(s):        --- Professional ---                           K29.70, Gastritis, unspecified, without bleeding                           R93.3, Abnormal findings on diagnostic imaging of                            other parts of digestive tract CPT copyright 2018 American Medical Association. All rights reserved. The codes documented in this report are preliminary and upon coder review may  be revised to meet current compliance requirements. Milus Banister, MD 09/16/2018 3:45:28 PM This report has been signed electronically. Number of Addenda: 0

## 2018-09-16 NOTE — H&P (View-Only) (Signed)
PROGRESS NOTE        PATIENT DETAILS Name: Ryan Medina Age: 57 y.o. Sex: male Date of Birth: [birth year] Admit Date: 09/03/2018 Admitting Physician [name], MD BTD:HRCBULA, No Pcp Per  Brief Narrative: Patient is a 57 y.o. male with history of cocaine use, chronic systolic heart failure presented to the hospital on 12/13 with a right ischemic leg secondary to acute popliteal and tibial embolus, underwent embolectomy on 12/13 and subsequently was placed on a heparin drip.  He unfortunately developed a acute right lower leg hematoma with compartment syndrome requiring fasciotomy on 12/15.  Further hospital course was complicated by development of hemorrhagic shock with acute blood loss anemia secondary to a bleeding duodenal ulcer-requiring ICU transfer-intubation for airway protection and IR evaluation with mesenteric angiogram and GDA embolization.  Upon stability he was transferred back to the triad hospitalist service on 12/19.  See below for further details.  Subjective:  Patient in bed, appears comfortable, denies any headache, no fever, no chest pain or pressure, no shortness of breath , no abdominal pain. No focal weakness.  However did have bloody stools last night.  Assessment/Plan:  Right ischemic leg secondary to popliteal artery/tibial artery embolism-s/p embolectomy on 45/36 complicated by right lower extremity hematoma with compartment syndrome requiring fasciotomy on 12/15:   Source of embolization unclear, required fasciotomy on 09/05/2018 by vascular surgery status post closure of fasciotomy on 09/10/2018.  His course was complicated by large hematoma in the right leg along with significant life-threatening GI bleed. Case discussed with vascular surgeon Dr. Monica Martinez who wants to continue anticoagulation as much as possible.  Also TEE requested on 09/11/2018 to figure out etiology of his arterial embolus will likely be done after  09/16/2018 EUS due to severe esophagitis reported on EGD done this admission.  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  Kindly see anticoagulation below.  Upper GI bleeding with hemorrhagic shock and acute blood loss anemia: Developed hemorrhagic shock, was seen by GI, required multiple PRBC transfusions last transfusion on 09/09/2018.  Was seen by GI and eventually underwent gastroduodenal artery embolization on 09/07/2018.  Total 8 units of packed RBC, 1 unit of FFP and 1 unit of platelets transfused around 09/08/2018. GI note noted from 09/10/2018 (okay with heparin challenge).   Cautiously started on heparin drip on 09/12/2018 with stable H&H unfortunately on 09/14/2018 he had few episodes of hematemesis again with some drop in H&H after was stopped, he was replaced on IV PPI along with clear liquid diet, he has received 3 units of packed RBC between 09/14/2018 and 09/15/2018, currently H&H stable looks like bleeding might have stopped, case was discussed with on-call GI physician Dr. Collene Mares on 09/16/2015 and Dr. Tarri Glenn on 09/17/2015, already due for EUS on 09/16/2018.   Urinary retention.  On Foley and Flomax.  Foley removed on 09/15/2018.     Acute hypoxic respiratory failure: Intubated when he developed hemorrhagic shock secondary to GI bleeding, successfully extubated-currently stable on 2 L of oxygen via nasal cannula.  Chronic systolic heart failure (EF 45-50% by TTE on 09/04/2018): Reasonably well compensated-follow weights, volume status, electrolytes closely.  Cocaine use/tobacco use: Counseled-I am still not sure if he has any intention of quitting.  3 cm AAA: Stable for follow-up in the outpatient setting  New pancreatic mass noted on CT scan on 09/11/2018.  MRI noted & confirms mass, CA 19.9 has been elevated, GI seeing and planning to do EUS on 09/16/2018.  Insulin-dependent DM-2: Last A1c on 12/2 was 18.2.  Increase Lantus for better control  continue sliding scale.  CBG (last 3)  Recent Labs    09/15/18 1704 09/16/18 0019 09/16/18 0759  GLUCAP 93 118* 123*      DVT Prophylaxis: Heparin drip had to be stopped on 09/15/2018 due to GI bleed, will place SCDs in the left leg.  Code Status: Full code   Family Communication: Wife at bedside 12/25 & 12/26  Disposition Plan: Tele  Antimicrobial agents: Anti-infectives (From admission, onward)   Start     Dose/Rate Route Frequency Ordered Stop   09/10/18 2130  ceFAZolin (ANCEF) IVPB 2g/100 mL premix     2 g 200 mL/hr over 30 Minutes Intravenous Every 8 hours 09/10/18 1634 09/11/18 0700   09/10/18 0800  ceFAZolin (ANCEF) IVPB 1 g/50 mL premix    Note to Pharmacy:  Send with pt to OR   1 g 100 mL/hr over 30 Minutes Intravenous On call 09/09/18 0756 09/10/18 1407   09/07/18 1300  erythromycin 250 mg in sodium chloride 0.9 % 100 mL IVPB     250 mg 100 mL/hr over 60 Minutes Intravenous Once 09/07/18 1125 09/07/18 2351   09/06/18 0000  ceFAZolin (ANCEF) IVPB 1 g/50 mL premix    Note to Pharmacy:  Send with pt to OR   1 g 100 mL/hr over 30 Minutes Intravenous On call 09/05/18 0849 09/05/18 0952   09/03/18 2245  ceFAZolin (ANCEF) IVPB 2g/100 mL premix     2 g 200 mL/hr over 30 Minutes Intravenous Every 8 hours 09/03/18 2236 09/04/18 1444   09/03/18 1615  ceFAZolin (ANCEF) IVPB 2g/100 mL premix  Status:  Discontinued     2 g 200 mL/hr over 30 Minutes Intravenous On call to O.R. 09/03/18 1600 09/03/18 2035      Procedures:  12/17>>Mesenteric angiogram and coil embolization of the GDA  12/17>>EGD  12/15>> 1.  Evacuation hematoma right leg 2.  Evacuation of lymphocele right leg 3.  4 compartment fasciotomy 4.  Placement of VAC (medial and lateral)  12/13>> 1.  Ultrasound-guided access to the left common femoral artery 2.  Aortogram with bilateral iliac arteriogram 3.  Selective catheterization of the right external iliac artery with right lower extremity  runoff  MRI Abd.  Pancreatic mass.  EUS - 12/26     CONSULTS:  pulmonary/intensive care, vascular surgery and IR, GI, Cards  Time spent: 40 minutes-Greater than 50% of this time was spent in counseling, explanation of diagnosis, planning of further management, and coordination of care.  MEDICATIONS: Scheduled Meds: . atorvastatin  20 mg Oral Daily  . chlorhexidine gluconate (MEDLINE KIT)  15 mL Mouth Rinse BID  . insulin aspart  0-9 Units Subcutaneous TID WC  . insulin glargine  10 Units Subcutaneous Daily  . lidocaine  15 mL Oral Once  . mouth rinse  15 mL Mouth Rinse BID  . pantoprazole (PROTONIX) IV  40 mg Intravenous Q12H  . tamsulosin  0.4 mg Oral Daily   Continuous Infusions: . sodium chloride     PRN Meds:.sodium chloride, alum & mag hydroxide-simeth, famotidine, ipratropium-albuterol, LORazepam, [DISCONTINUED] ondansetron **OR** ondansetron (ZOFRAN) IV, oxyCODONE-acetaminophen, polyethylene glycol, senna-docusate, sodium chloride flush   PHYSICAL EXAM: Vital signs: Vitals:   09/15/18 2254 09/16/18 0113 09/16/18 0143 09/16/18 0339  BP: 127/84 (!) 126/94 126/89 131/89  Pulse: 92 (!)  56 86 85  Resp: _0 Temp: 98.9 F (37.2 C) 98.5 F (36.9 C) 98.5 F (36.9 C) 98.5 F (36.9 C)  TempSrc: Oral Oral Oral Oral  SpO2: 100% 100% 100% 100%  Weight:      Height:       Filed Weights   09/03/18 1650  Weight: 66.7 kg   Body mass index is 20.51 kg/m.   Exam  Awake Alert, Oriented X 3, No new F.N deficits, Normal affect [place].AT,PERRAL Supple Neck,No JVD, No cervical lymphadenopathy appriciated.  Symmetrical Chest wall movement, Good air movement bilaterally, CTAB RRR,No Gallops, Rubs or new Murmurs, No Parasternal Heave +ve B.Sounds, Abd Soft, No tenderness, No organomegaly appriciated, No rebound - guarding or rigidity. No Cyanosis, Clubbing or edema, R leg staples look good   I have personally reviewed following labs and imaging studies  LABORATORY  DATA: CBC: Recent Labs  Lab 09/12/18 1928  09/13/18 0352 09/14/18 0214 09/15/18 0427 09/15/18 1918 09/15/18 2016 09/16/18 0550  WBC 7.5  --  7.9 7.9 12.0*  --   --  10.7*  HGB 8.7*   < > 8.5* 8.6* 7.3* 6.6* 6.5* 9.1*  HCT 25.8*   < > 24.9* 25.8* 22.8* 20.0* 20.1* 27.4*  MCV 84.6  --  84.4 83.5 85.4  --   --  83.5  PLT 240  --  246 290 330  --   --  290   < > = values in this interval not displayed.    Basic Metabolic Panel: Recent Labs  Lab 09/10/18 0412  09/12/18 0415 09/13/18 0352 09/14/18 0214 09/15/18 0427 09/16/18 0550  NA 133*   < > 135 138 134* 135 134*  K 3.3*   < > 3.4* 3.4* 3.6 3.7 3.8  CL 102   < > 104 100 95* 96* 102  CO2 24   < > _1 GLUCOSE 221*   < > 297* 254* 263* 311* 138*  BUN 10   < > 10 6 <5* 29* 15  CREATININE 0.57*   < > 0.75 0.49* 0.62 0.74 0.69  CALCIUM 7.0*   < > 7.1* 7.4* 7.6* 7.4* 7.5*  MG 1.8  --  1.6* 1.7  --   --  1.7  PHOS 2.3*  --   --   --   --   --   --    < > = values in this interval not displayed.    GFR: Estimated Creatinine Clearance: 96.1 mL/min (by C-G formula based on SCr of 0.69 mg/dL).  Liver Function Tests: Recent Labs  Lab 09/12/18 0415 09/13/18 0352 09/14/18 0214 09/15/18 0427 09/16/18 0550  AST 20 26 32 38 33  ALT _2 ALKPHOS 52 53 59 53 49  BILITOT 0.5 0.6 0.8 0.7 1.0  PROT 3.7* 4.2* 4.8* 4.6* 4.4*  ALBUMIN 1.2* 1.4* 1.6* 1.6* 1.6*   No results for input(s): LIPASE, AMYLASE in the last 168 hours. No results for input(s): AMMONIA in the last 168 hours.  Coagulation Profile: No results for input(s): INR, PROTIME in the last 168 hours.  Cardiac Enzymes: No results for input(s): CKTOTAL, CKMB, CKMBINDEX, TROPONINI in the last 168 hours.  BNP (last 3 results) No results for input(s): PROBNP in the last 8760 hours.  HbA1C: No results for input(s): HGBA1C in the last 72 hours.  CBG: Recent Labs  Lab 09/15/18 1234 09/15/18 1440 09/15/18 1704 09/16/18 0019 09/16/18 5436  GLUCAP 195* 153* 93 118* 123*    Lipid Profile: No results for input(s): CHOL, HDL, LDLCALC, TRIG, CHOLHDL, LDLDIRECT in the last 72 hours.  Thyroid Function Tests: No results for input(s): TSH, T4TOTAL, FREET4, T3FREE, THYROIDAB in the last 72 hours.  Anemia Panel: No results for input(s): VITAMINB12, FOLATE, FERRITIN, TIBC, IRON, RETICCTPCT in the last 72 hours.  Urine analysis:    Component Value Date/Time   COLORURINE STRAW (A) 08/22/2018 2054   APPEARANCEUR CLEAR 08/22/2018 2054   LABSPEC 1.027 08/22/2018 2054   PHURINE 6.0 08/22/2018 2054   GLUCOSEU >=500 (A) 08/22/2018 2054   HGBUR NEGATIVE 08/22/2018 2054   [place] NEGATIVE 08/22/2018 2054   KETONESUR 80 (A) 08/22/2018 2054   PROTEINUR NEGATIVE 08/22/2018 2054   UROBILINOGEN 4.0 (H) 12/05/2016 0921   NITRITE NEGATIVE 08/22/2018 2054   LEUKOCYTESUR NEGATIVE 08/22/2018 2054    Sepsis Labs: Lactic Acid, Venous    Component Value Date/Time   LATICACIDVEN 1.7 09/07/2018 1600    MICROBIOLOGY: Recent Results (from the past 240 hour(s))  Surgical pcr screen     Status: None   Collection Time: 09/10/18  3:39 AM  Result Value Ref Range Status   MRSA, PCR NEGATIVE NEGATIVE Final   Staphylococcus aureus NEGATIVE NEGATIVE Final    Comment: (NOTE) The Xpert SA Assay (FDA approved for NASAL specimens in patients 55 years of age and older), is one component of a comprehensive surveillance program. It is not intended to diagnose infection nor to guide or monitor treatment. Performed at [facility], [place] [address] [place], [place] [address]     RADIOLOGY STUDIES/RESULTS: Dg Abd 1 View  Result Date: 09/07/2018 CLINICAL DATA:  OG tube placement. EXAM: ABDOMEN - 1 VIEW COMPARISON:  CT abdomen pelvis 08/22/2018. FINDINGS: OG tube is in place with the side port and tip in the body of the stomach. The stomach is distended. IMPRESSION: OG tube in good position. Electronically Signed   By: Inge Rise M.D.    On: 09/07/2018 13:31   Ct Angio Chest Pe W Or Wo Contrast  Result Date: 09/06/2018 CLINICAL DATA:  Evaluate thoracic aortic aneurysm. EXAM: CT ANGIOGRAPHY CHEST WITH CONTRAST TECHNIQUE: Multidetector CT imaging of the chest was performed using the standard protocol during bolus administration of intravenous contrast. Multiplanar CT image reconstructions and MIPs were obtained to evaluate the vascular anatomy. CONTRAST:  65 mL ISOVUE-370 IOPAMIDOL (ISOVUE-370) INJECTION 76% COMPARISON:  01/30/2018; 10/14/2016; 04/25/2026 FINDINGS: Vascular Findings: No evidence of thoracic aortic aneurysm with measurements as follows. No definite evidence of thoracic aortic dissection or periaortic stranding on this nongated examination. Bovine configuration of the aortic arch. The branch vessels of the aortic arch appear widely patent throughout their imaged courses. The descending thoracic aorta is of normal caliber and widely patent without hemodynamically significant stenosis. There is a very minimal amount of atherosclerotic plaque involving the proximal aspect of the descending thoracic aorta. Cardiomegaly.  No pericardial effusion. Although this examination was not tailored for the evaluation the pulmonary arteries, there are no discrete filling defects within the central pulmonary arterial tree to suggest central pulmonary embolism. Borderline enlarged caliber the main pulmonary artery measuring 32 mm in diameter (image 67, series 6) ------------------------------------------------------------- Thoracic aortic measurements: Sinotubular junction 33 mm as measured in greatest oblique coronal dimension. Proximal ascending aorta 35 mm as measured in greatest oblique axial dimension at the level of the main pulmonary artery (image 70, series 6); and approximately 36 mm in greatest oblique short axis coronal diameter (coronal  image 48, series 9). Aortic arch aorta 28 mm as measured in greatest oblique sagittal dimension.  Proximal descending thoracic aorta 30 mm as measured in greatest oblique axial dimension at the level of the main pulmonary artery. Distal descending thoracic aorta 29 mm as measured in greatest oblique axial dimension at the level of the diaphragmatic hiatus. Review of the MIP images confirms the above findings. ------------------------------------------------------------- Non-Vascular Findings: Mediastinum/Lymph Nodes: Prominent right hilar lymph nodes are not enlarged by size criteria with index right suprahilar lymph node measuring 0.8 cm in greatest short axis diameter (image 71, series 6) and index right infrahilar lymph node measuring 0.7 cm (image 88). No definitive bulky mediastinal, hilar or axillary lymphadenopathy. Lungs/Pleura: Interval development of small bilateral pleural effusions with worsening bibasilar consolidative opacities and associated air bronchograms, right greater than left. Interval development of an approximately 1.0 x 0.5 cm subpleural consolidative opacity within the right upper lobe (image 36, series 7). Unchanged punctate (approximately 3 mm) calcified granuloma within the left lower lobe (image 75, series 3). Evaluation for additional pulmonary nodules is degraded secondary to bibasilar airspace opacities. The central pulmonary airways appear widely patent. Upper abdomen: Fluid distention of the mid and distal aspects of the esophagus with marked distension of the imaged superior aspect of the stomach. Musculoskeletal: No acute or aggressive osseous abnormalities. There is incomplete fusion involving the spinous processes of multiple thoracic vertebral bodies, unchanged. Regional soft tissues appear normal. Normal appearance of the thyroid gland. IMPRESSION: 1. No evidence of thoracic aortic aneurysm or dissection. 2. Small bilateral effusions with associated bibasilar consolidative opacities with associated air bronchograms, atelectasis versus infiltrate. Follow-up chest  radiograph in 4-6 weeks after treatment is recommended to ensure resolution. 3. Fluid distension of the mid and distal aspects of the esophagus as well as marked distension of the imaged superior aspect stomach - correlation for gastric outlet obstructive symptoms is advised. 4. Cardiomegaly with enlargement of the caliber the main pulmonary artery as could be seen in the setting of pulmonary arterial hypertension. Electronically Signed   By: Sandi Mariscal M.D.   On: 09/06/2018 09:20   Mr Abdomen W Wo Contrast  Result Date: 09/12/2018 CLINICAL DATA:  Suspicion of pancreatic neck mass on CT. EXAM: MRI ABDOMEN WITHOUT AND WITH CONTRAST TECHNIQUE: Multiplanar multisequence MR imaging of the abdomen was performed both before and after the administration of intravenous contrast. CONTRAST:  6 cc Gadavist COMPARISON:  CT of 1 day prior. Stone study of 08/22/2018. More remote CT of 12/19/2017. FINDINGS: Moderatemultifactorial degradation, including respiratory motion and susceptibility artifact from embolization coils. Lower chest: Bilateral pleural fluid with bibasilar airspace disease. Normal heart size. Hepatobiliary: Left hepatic lobe 9 mm cyst. Too small to characterizea right hepatic lobe lesion is most likely a cyst at 2 mm. Normal gallbladder, without biliary ductal dilatation. Pancreas: An area of soft tissue fullness and restricted diffusion is identified within the pancreatic neck, corresponding to the abnormality on CT. This measures on the order of 3.9 x 3.6 cm on image 76/12. Also 3.6 x 3.6 cm on image 35/31. Results in pancreatic duct dilatation within the upstream body and tail on image 17/11. Involves the celiac and its branches including on image 75/12. Spleen:  Grossly within normal limits. Adrenals/Urinary Tract: Normal adrenal glands. Grossly normal kidneys, without hydronephrosis. Stomach/Bowel: Gastric distension with proximal wall and fold thickening, including on image 73/12. Grossly normal  abdominal bowel loops. Vascular/Lymphatic: Normal caliber of the aorta. 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 Limited evaluation for  abdominal adenopathy. Other:  Small volume abdominal ascites. Musculoskeletal: No acute osseous abnormality. IMPRESSION: 1. Moderate motion and susceptibility artifact degradation throughout. 2. Confirmation of a pancreatic neck mass, most consistent with adenocarcinoma. Involvement of the celiac and its branches, most consistent with a non-operative lesion. 3. Consider endoscopic ultrasound sampling. 4. Gastric wall and fold thickening, suggesting gastritis. 5. Small bilateral pleural effusions with adjacent airspace disease. Electronically Signed   By: Abigail Miyamoto M.D.   On: 09/12/2018 03:04   Ir Angiogram Visceral Selective  Result Date: 09/07/2018 INDICATION: 57 year old male with large volume upper GI bleed secondary to a very large duodenal ulcer. Patient underwent endoscopic intervention but continues to require pressors and large volume transfusion. He presents for mesenteric arteriography and embolization of any active bleeding and prophylactic embolization of the gastroduodenal artery. EXAM: IR ULTRASOUND GUIDANCE VASC ACCESS RIGHT; ADDITIONAL ARTERIOGRAPHY; IR EMBO ART VEN HEMORR LYMPH EXTRAV INC GUIDE ROADMAPPING; SELECTIVE VISCERAL ARTERIOGRAPHY 1. Ultrasound-guided vascular access 2. Catheterization of the left gastric artery where it arises directly from the aorta with arteriogram 3. Catheterization of the celiac artery with arteriogram 4. Catheterization of the common hepatic artery with arteriogram 5. Catheterization of the gastroduodenal artery with arteriogram 6. Coil embolization of the gastroduodenal artery 7. Catheterization of the superior mesenteric artery with arteriogram MEDICATIONS: None ANESTHESIA/SEDATION: Patient intubated and on propofol drip CONTRAST:  70 mL Isovue 370 FLUOROSCOPY TIME:  Fluoroscopy Time: 11 minutes 30 seconds (487  mGy). COMPLICATIONS: None immediate. PROCEDURE: Informed consent was obtained from the patient following explanation of the procedure, risks, benefits and alternatives. The patient understands, agrees and consents for the procedure. All questions were addressed. A time out was performed prior to the initiation of the procedure. Maximal barrier sterile technique utilized including caps, mask, sterile gowns, sterile gloves, large sterile drape, hand hygiene, and Betadine prep. The right common femoral artery was interrogated with ultrasound and found to be widely patent. An image was obtained and stored for the medical record. Local anesthesia was attained by infiltration with 1% lidocaine. A small dermatotomy was made. Under real-time sonographic guidance, the vessel was punctured with a 21 gauge micropuncture needle. Using standard technique, the initial micro needle was exchanged over a 0.018 micro wire for a transitional 4 [place] micro sheath. The micro sheath was then exchanged over a 0.035 wire for a 5 French vascular sheath. A C2 cobra catheter was advanced in the abdominal aorta over a Bentson wire. The catheter was used to select the first vessel arising from the ventral aorta. Contrast was injected. This is a left gastric artery arising directly from the aorta. Anatomy is normal. No evidence of active bleeding. The Cobra catheter was then used to select the origin of the celiac artery. Arteriography was performed. The splenic artery is small in caliber. The hepatic artery demonstrates normal hepatic arterial anatomy. The gastroduodenal artery is slightly irregular with spasm in the proximal segment. No evidence of active extravasation. A glidewire was successfully advanced into the common hepatic artery and the C2 cobra catheter was advanced into the common hepatic artery. Additional arteriography was performed in multiple obliquities. Again, the proximal gastroduodenal artery is irregular and spasmodic. No  evidence of dissection, aneurysm or active bleeding. A Lantern microcatheter was then advanced over a Fathom 16 wire and used to select the distal aspect of the gastroduodenal artery. Arteriography was performed. The gastroepiploic arteries are widely patent. No evidence of active bleeding. Coil embolization was then performed using a 4 mm Ruby POD detachable micro coil  followed by a 45 cm Ruby packing coil. Post embolization arteriography demonstrates complete occlusion of the vessel with stasis in the proximal segment. The Lantern microcatheter was brought back into the common hepatic artery and arteriography was performed. Again, complete occlusion of the gastroduodenal artery. The right gastric artery is visualized. No evidence of active bleeding from the right gastric artery. The microcatheter was removed. The C2 cobra catheter was used to select the superior mesenteric artery. Arteriography was performed. Patent pancreaticoduodenal arcade supplying the gastroepiploic artery. No evidence of active hemorrhage. The C2 cobra catheter was removed. A limited right common femoral arteriogram was performed confirming common femoral arterial access. Hemostasis was attained with the assistance of a Cordis ExoSeal extra arterial vascular plug. IMPRESSION: 1. No evidence of active hemorrhage. 2. 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 3. Prophylactic coil embolization of the gastroduodenal artery. Signed, Criselda Peaches, MD, [facility] Electronically Signed   By: Jacqulynn Cadet M.D.   On: 09/07/2018 22:32   Ir Angiogram Visceral Selective  Result Date: 09/07/2018 INDICATION: 57 year old male with large volume upper GI bleed secondary to a very large duodenal ulcer. Patient underwent endoscopic intervention but continues to require pressors and large volume transfusion. He presents for mesenteric arteriography and  embolization of any active bleeding and prophylactic embolization of the gastroduodenal artery. EXAM: IR ULTRASOUND GUIDANCE VASC ACCESS RIGHT; ADDITIONAL ARTERIOGRAPHY; IR EMBO ART VEN HEMORR LYMPH EXTRAV INC GUIDE ROADMAPPING; SELECTIVE VISCERAL ARTERIOGRAPHY 1. Ultrasound-guided vascular access 2. Catheterization of the left gastric artery where it arises directly from the aorta with arteriogram 3. Catheterization of the celiac artery with arteriogram 4. Catheterization of the common hepatic artery with arteriogram 5. Catheterization of the gastroduodenal artery with arteriogram 6. Coil embolization of the gastroduodenal artery 7. Catheterization of the superior mesenteric artery with arteriogram MEDICATIONS: None ANESTHESIA/SEDATION: Patient intubated and on propofol drip CONTRAST:  70 mL Isovue 370 FLUOROSCOPY TIME:  Fluoroscopy Time: 11 minutes 30 seconds (487 mGy). COMPLICATIONS: None immediate. PROCEDURE: Informed consent was obtained from the patient following explanation of the procedure, risks, benefits and alternatives. The patient understands, agrees and consents for the procedure. All questions were addressed. A time out was performed prior to the initiation of the procedure. Maximal barrier sterile technique utilized including caps, mask, sterile gowns, sterile gloves, large sterile drape, hand hygiene, and Betadine prep. The right common femoral artery was interrogated with ultrasound and found to be widely patent. An image was obtained and stored for the medical record. Local anesthesia was attained by infiltration with 1% lidocaine. A small dermatotomy was made. Under real-time sonographic guidance, the vessel was punctured with a 21 gauge micropuncture needle. Using standard technique, the initial micro needle was exchanged over a 0.018 micro wire for a transitional 4 [place] micro sheath. The micro sheath was then exchanged over a 0.035 wire for a 5 French vascular sheath. A C2 cobra catheter was  advanced in the abdominal aorta over a Bentson wire. The catheter was used to select the first vessel arising from the ventral aorta. Contrast was injected. This is a left gastric artery arising directly from the aorta. Anatomy is normal. No evidence of active bleeding. The Cobra catheter was then used to select the origin of the celiac artery. Arteriography was performed. The splenic artery is small in caliber. The hepatic artery demonstrates normal hepatic arterial anatomy. The gastroduodenal artery is slightly irregular with spasm in the proximal segment. No evidence of active extravasation. A glidewire was  successfully advanced into the common hepatic artery and the C2 cobra catheter was advanced into the common hepatic artery. Additional arteriography was performed in multiple obliquities. Again, the proximal gastroduodenal artery is irregular and spasmodic. No evidence of dissection, aneurysm or active bleeding. A Lantern microcatheter was then advanced over a Fathom 16 wire and used to select the distal aspect of the gastroduodenal artery. Arteriography was performed. The gastroepiploic arteries are widely patent. No evidence of active bleeding. Coil embolization was then performed using a 4 mm Ruby POD detachable micro coil followed by a 45 cm Ruby packing coil. Post embolization arteriography demonstrates complete occlusion of the vessel with stasis in the proximal segment. The Lantern microcatheter was brought back into the common hepatic artery and arteriography was performed. Again, complete occlusion of the gastroduodenal artery. The right gastric artery is visualized. No evidence of active bleeding from the right gastric artery. The microcatheter was removed. The C2 cobra catheter was used to select the superior mesenteric artery. Arteriography was performed. Patent pancreaticoduodenal arcade supplying the gastroepiploic artery. No evidence of active hemorrhage. The C2 cobra catheter was removed. A  limited right common femoral arteriogram was performed confirming common femoral arterial access. Hemostasis was attained with the assistance of a Cordis ExoSeal extra arterial vascular plug. IMPRESSION: 1. No evidence of active hemorrhage. 2. 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 3. Prophylactic coil embolization of the gastroduodenal artery. Signed, Criselda Peaches, MD, [place] [facility] Radiology Electronically Signed   By: Jacqulynn Cadet M.D.   On: 09/07/2018 22:32   Ir Angiogram Selective Each Additional Vessel  Result Date: 09/07/2018 INDICATION: 57 year old male with large volume upper GI bleed secondary to a very large duodenal ulcer. Patient underwent endoscopic intervention but continues to require pressors and large volume transfusion. He presents for mesenteric arteriography and embolization of any active bleeding and prophylactic embolization of the gastroduodenal artery. EXAM: IR ULTRASOUND GUIDANCE VASC ACCESS RIGHT; ADDITIONAL ARTERIOGRAPHY; IR EMBO ART VEN HEMORR LYMPH EXTRAV INC GUIDE ROADMAPPING; SELECTIVE VISCERAL ARTERIOGRAPHY 1. Ultrasound-guided vascular access 2. Catheterization of the left gastric artery where it arises directly from the aorta with arteriogram 3. Catheterization of the celiac artery with arteriogram 4. Catheterization of the common hepatic artery with arteriogram 5. Catheterization of the gastroduodenal artery with arteriogram 6. Coil embolization of the gastroduodenal artery 7. Catheterization of the superior mesenteric artery with arteriogram MEDICATIONS: None ANESTHESIA/SEDATION: Patient intubated and on propofol drip CONTRAST:  70 mL Isovue 370 FLUOROSCOPY TIME:  Fluoroscopy Time: 11 minutes 30 seconds (487 mGy). COMPLICATIONS: None immediate. PROCEDURE: Informed consent was obtained from the patient following explanation of the procedure, risks, benefits and alternatives. The patient  understands, agrees and consents for the procedure. All questions were addressed. A time out was performed prior to the initiation of the procedure. Maximal barrier sterile technique utilized including caps, mask, sterile gowns, sterile gloves, large sterile drape, hand hygiene, and Betadine prep. The right common femoral artery was interrogated with ultrasound and found to be widely patent. An image was obtained and stored for the medical record. Local anesthesia was attained by infiltration with 1% lidocaine. A small dermatotomy was made. Under real-time sonographic guidance, the vessel was punctured with a 21 gauge micropuncture needle. Using standard technique, the initial micro needle was exchanged over a 0.018 micro wire for a transitional 4 [place] micro sheath. The micro sheath was then exchanged over a 0.035 wire for a 5 French vascular sheath. A C2 cobra catheter was advanced in  the abdominal aorta over a Bentson wire. The catheter was used to select the first vessel arising from the ventral aorta. Contrast was injected. This is a left gastric artery arising directly from the aorta. Anatomy is normal. No evidence of active bleeding. The Cobra catheter was then used to select the origin of the celiac artery. Arteriography was performed. The splenic artery is small in caliber. The hepatic artery demonstrates normal hepatic arterial anatomy. The gastroduodenal artery is slightly irregular with spasm in the proximal segment. No evidence of active extravasation. A glidewire was successfully advanced into the common hepatic artery and the C2 cobra catheter was advanced into the common hepatic artery. Additional arteriography was performed in multiple obliquities. Again, the proximal gastroduodenal artery is irregular and spasmodic. No evidence of dissection, aneurysm or active bleeding. A Lantern microcatheter was then advanced over a Fathom 16 wire and used to select the distal aspect of the gastroduodenal  artery. Arteriography was performed. The gastroepiploic arteries are widely patent. No evidence of active bleeding. Coil embolization was then performed using a 4 mm Ruby POD detachable micro coil followed by a 45 cm Ruby packing coil. Post embolization arteriography demonstrates complete occlusion of the vessel with stasis in the proximal segment. The Lantern microcatheter was brought back into the common hepatic artery and arteriography was performed. Again, complete occlusion of the gastroduodenal artery. The right gastric artery is visualized. No evidence of active bleeding from the right gastric artery. The microcatheter was removed. The C2 cobra catheter was used to select the superior mesenteric artery. Arteriography was performed. Patent pancreaticoduodenal arcade supplying the gastroepiploic artery. No evidence of active hemorrhage. The C2 cobra catheter was removed. A limited right common femoral arteriogram was performed confirming common femoral arterial access. Hemostasis was attained with the assistance of a Cordis ExoSeal extra arterial vascular plug. IMPRESSION: 1. No evidence of active hemorrhage. 2. 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 3. Prophylactic coil embolization of the gastroduodenal artery. Signed, Criselda Peaches, MD, [place] [facility] [place] Radiology Electronically Signed   By: Jacqulynn Cadet M.D.   On: 09/07/2018 22:32   Ir Angiogram Selective Each Additional Vessel  Result Date: 09/07/2018 INDICATION: 57 year old male with large volume upper GI bleed secondary to a very large duodenal ulcer. Patient underwent endoscopic intervention but continues to require pressors and large volume transfusion. He presents for mesenteric arteriography and embolization of any active bleeding and prophylactic embolization of the gastroduodenal artery. EXAM: IR ULTRASOUND GUIDANCE VASC ACCESS RIGHT; ADDITIONAL ARTERIOGRAPHY;  IR EMBO ART VEN HEMORR LYMPH EXTRAV INC GUIDE ROADMAPPING; SELECTIVE VISCERAL ARTERIOGRAPHY 1. Ultrasound-guided vascular access 2. Catheterization of the left gastric artery where it arises directly from the aorta with arteriogram 3. Catheterization of the celiac artery with arteriogram 4. Catheterization of the common hepatic artery with arteriogram 5. Catheterization of the gastroduodenal artery with arteriogram 6. Coil embolization of the gastroduodenal artery 7. Catheterization of the superior mesenteric artery with arteriogram MEDICATIONS: None ANESTHESIA/SEDATION: Patient intubated and on propofol drip CONTRAST:  70 mL Isovue 370 FLUOROSCOPY TIME:  Fluoroscopy Time: 11 minutes 30 seconds (487 mGy). COMPLICATIONS: None immediate. PROCEDURE: Informed consent was obtained from the patient following explanation of the procedure, risks, benefits and alternatives. The patient understands, agrees and consents for the procedure. All questions were addressed. A time out was performed prior to the initiation of the procedure. Maximal barrier sterile technique utilized including caps, mask, sterile gowns, sterile gloves, large sterile drape, hand hygiene, and Betadine prep. The right  common femoral artery was interrogated with ultrasound and found to be widely patent. An image was obtained and stored for the medical record. Local anesthesia was attained by infiltration with 1% lidocaine. A small dermatotomy was made. Under real-time sonographic guidance, the vessel was punctured with a 21 gauge micropuncture needle. Using standard technique, the initial micro needle was exchanged over a 0.018 micro wire for a transitional 4 [place] micro sheath. The micro sheath was then exchanged over a 0.035 wire for a 5 French vascular sheath. A C2 cobra catheter was advanced in the abdominal aorta over a Bentson wire. The catheter was used to select the first vessel arising from the ventral aorta. Contrast was injected. This is a  left gastric artery arising directly from the aorta. Anatomy is normal. No evidence of active bleeding. The Cobra catheter was then used to select the origin of the celiac artery. Arteriography was performed. The splenic artery is small in caliber. The hepatic artery demonstrates normal hepatic arterial anatomy. The gastroduodenal artery is slightly irregular with spasm in the proximal segment. No evidence of active extravasation. A glidewire was successfully advanced into the common hepatic artery and the C2 cobra catheter was advanced into the common hepatic artery. Additional arteriography was performed in multiple obliquities. Again, the proximal gastroduodenal artery is irregular and spasmodic. No evidence of dissection, aneurysm or active bleeding. A Lantern microcatheter was then advanced over a Fathom 16 wire and used to select the distal aspect of the gastroduodenal artery. Arteriography was performed. The gastroepiploic arteries are widely patent. No evidence of active bleeding. Coil embolization was then performed using a 4 mm Ruby POD detachable micro coil followed by a 45 cm Ruby packing coil. Post embolization arteriography demonstrates complete occlusion of the vessel with stasis in the proximal segment. The Lantern microcatheter was brought back into the common hepatic artery and arteriography was performed. Again, complete occlusion of the gastroduodenal artery. The right gastric artery is visualized. No evidence of active bleeding from the right gastric artery. The microcatheter was removed. The C2 cobra catheter was used to select the superior mesenteric artery. Arteriography was performed. Patent pancreaticoduodenal arcade supplying the gastroepiploic artery. No evidence of active hemorrhage. The C2 cobra catheter was removed. A limited right common femoral arteriogram was performed confirming common femoral arterial access. Hemostasis was attained with the assistance of a Cordis ExoSeal extra  arterial vascular plug. IMPRESSION: 1. No evidence of active hemorrhage. 2. 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 3. Prophylactic coil embolization of the gastroduodenal artery. Signed, Criselda Peaches, MD, [place] [facility] Radiology Electronically Signed   By: Jacqulynn Cadet M.D.   On: 09/07/2018 22:32   Ir US Guide Vasc Access Right  Result Date: 09/07/2018 INDICATION: 57 year old male with large volume upper GI bleed secondary to a very large duodenal ulcer. Patient underwent endoscopic intervention but continues to require pressors and large volume transfusion. He presents for mesenteric arteriography and embolization of any active bleeding and prophylactic embolization of the gastroduodenal artery. EXAM: IR ULTRASOUND GUIDANCE VASC ACCESS RIGHT; ADDITIONAL ARTERIOGRAPHY; IR EMBO ART VEN HEMORR LYMPH EXTRAV INC GUIDE ROADMAPPING; SELECTIVE VISCERAL ARTERIOGRAPHY 1. Ultrasound-guided vascular access 2. Catheterization of the left gastric artery where it arises directly from the aorta with arteriogram 3. Catheterization of the celiac artery with arteriogram 4. Catheterization of the common hepatic artery with arteriogram 5. Catheterization of the gastroduodenal artery with arteriogram 6. Coil embolization of the gastroduodenal artery 7. Catheterization of the superior mesenteric artery  with arteriogram MEDICATIONS: None ANESTHESIA/SEDATION: Patient intubated and on propofol drip CONTRAST:  70 mL Isovue 370 FLUOROSCOPY TIME:  Fluoroscopy Time: 11 minutes 30 seconds (487 mGy). COMPLICATIONS: None immediate. PROCEDURE: Informed consent was obtained from the patient following explanation of the procedure, risks, benefits and alternatives. The patient understands, agrees and consents for the procedure. All questions were addressed. A time out was performed prior to the initiation of the procedure. Maximal barrier sterile technique  utilized including caps, mask, sterile gowns, sterile gloves, large sterile drape, hand hygiene, and Betadine prep. The right common femoral artery was interrogated with ultrasound and found to be widely patent. An image was obtained and stored for the medical record. Local anesthesia was attained by infiltration with 1% lidocaine. A small dermatotomy was made. Under real-time sonographic guidance, the vessel was punctured with a 21 gauge micropuncture needle. Using standard technique, the initial micro needle was exchanged over a 0.018 micro wire for a transitional 4 [place] micro sheath. The micro sheath was then exchanged over a 0.035 wire for a 5 French vascular sheath. A C2 cobra catheter was advanced in the abdominal aorta over a Bentson wire. The catheter was used to select the first vessel arising from the ventral aorta. Contrast was injected. This is a left gastric artery arising directly from the aorta. Anatomy is normal. No evidence of active bleeding. The Cobra catheter was then used to select the origin of the celiac artery. Arteriography was performed. The splenic artery is small in caliber. The hepatic artery demonstrates normal hepatic arterial anatomy. The gastroduodenal artery is slightly irregular with spasm in the proximal segment. No evidence of active extravasation. A glidewire was successfully advanced into the common hepatic artery and the C2 cobra catheter was advanced into the common hepatic artery. Additional arteriography was performed in multiple obliquities. Again, the proximal gastroduodenal artery is irregular and spasmodic. No evidence of dissection, aneurysm or active bleeding. A Lantern microcatheter was then advanced over a Fathom 16 wire and used to select the distal aspect of the gastroduodenal artery. Arteriography was performed. The gastroepiploic arteries are widely patent. No evidence of active bleeding. Coil embolization was then performed using a 4 mm Ruby POD detachable  micro coil followed by a 45 cm Ruby packing coil. Post embolization arteriography demonstrates complete occlusion of the vessel with stasis in the proximal segment. The Lantern microcatheter was brought back into the common hepatic artery and arteriography was performed. Again, complete occlusion of the gastroduodenal artery. The right gastric artery is visualized. No evidence of active bleeding from the right gastric artery. The microcatheter was removed. The C2 cobra catheter was used to select the superior mesenteric artery. Arteriography was performed. Patent pancreaticoduodenal arcade supplying the gastroepiploic artery. No evidence of active hemorrhage. The C2 cobra catheter was removed. A limited right common femoral arteriogram was performed confirming common femoral arterial access. Hemostasis was attained with the assistance of a Cordis ExoSeal extra arterial vascular plug. IMPRESSION: 1. No evidence of active hemorrhage. 2. 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 3. Prophylactic coil embolization of the gastroduodenal artery. Signed, Criselda Peaches, MD, [place] [facility] Radiology Electronically Signed   By: Jacqulynn Cadet M.D.   On: 09/07/2018 22:32   Dg Chest Port 1 View  Result Date: 09/09/2018 CLINICAL DATA:  Respiratory failure EXAM: PORTABLE CHEST 1 VIEW COMPARISON:  09/07/2018 FINDINGS: Endotracheal tube and nasogastric catheter have been removed in the interval. Left jugular central line is again noted in the  distal superior vena cava. The cardiac shadow is stable. The lungs are well aerated bilaterally with patchy bibasilar infiltrates and small left pleural effusion. IMPRESSION: Stable bibasilar infiltrates and small left pleural effusion. Electronically Signed   By: Inez Catalina M.D.   On: 09/09/2018 07:17   Dg Chest Port 1 View  Result Date: 09/07/2018 CLINICAL DATA:  Status post fasciotomy and hematoma  back UA shins in the right lower leg 2 days ago. Intubated patient. EXAM: PORTABLE CHEST 1 VIEW COMPARISON:  CT scan of the chest of September 06, 2018 FINDINGS: The lungs are well-expanded. There are patchy increased densities at both bases which are slightly more conspicuous today. There is a small right and and somewhat larger left pleural effusion. The heart and pulmonary vascularity are normal. The endotracheal tube tip projects 4.1 cm above the carina. The esophagogastric tube tip in proximal port project below the GE junction. The left internal jugular venous catheter tip projects over the midportion of the SVC. IMPRESSION: Bibasilar atelectasis or pneumonia. Small right pleural effusion and somewhat larger left pleural effusion. The support tubes are in reasonable position. Electronically Signed   By: David  Martinique M.D.   On: 09/07/2018 13:27   Vas [place] Burnard Bunting With/wo Tbi  Result Date: 09/15/2018 LOWER EXTREMITY DOPPLER STUDY Indications: Peripheral artery disease.  Vascular Interventions: Status post right popliteal - tibial embolectomy and                         fasciotomy. Comparison Study: No exam for comparison Performing Technologist: Birdena Crandall, [place] [facility]  Examination Guidelines: A complete evaluation includes at minimum, Doppler waveform signals and systolic blood pressure reading at the level of bilateral brachial, anterior tibial, and posterior tibial arteries, when vessel segments are accessible. Bilateral testing is considered an integral part of a complete examination. Photoelectric Plethysmograph (PPG) waveforms and toe systolic pressure readings are included as required and additional duplex testing as needed. Limited examinations for reoccurring indications may be performed as noted.  ABI Findings: +--------+------------------+-----+----------+--------+ Right   Rt Pressure (mmHg)IndexWaveform  Comment  +--------+------------------+-----+----------+--------+ [id number]                     triphasic          +--------+------------------+-----+----------+--------+ PTA     179               1.36 triphasic          +--------+------------------+-----+----------+--------+ DP      152               1.15 monophasic         +--------+------------------+-----+----------+--------+ +--------+------------------+-----+---------+-------+ Left    Lt Pressure (mmHg)IndexWaveform Comment +--------+------------------+-----+---------+-------+ [id number]                    triphasic        +--------+------------------+-----+---------+-------+ PTA     176               1.33 triphasic        +--------+------------------+-----+---------+-------+ DP      162               1.23 triphasic        +--------+------------------+-----+---------+-------+ +-------+-----------+-----------+------------+------------+ ABI/TBIToday's ABIToday's TBIPrevious ABIPrevious TBI +-------+-----------+-----------+------------+------------+ Right  1.36                                           +-------+-----------+-----------+------------+------------+  Left   1.33                                           +-------+-----------+-----------+------------+------------+  Summary: Right: Resting right ankle-brachial index indicates noncompressible right lower extremity arteries. Left: Resting left ankle-brachial index indicates noncompressible left lower extremity arteries.  *See table(s) above for measurements and observations.  Electronically signed by Harold Barban MD on 09/15/2018 at 1:50:47 PM.   Final    Ct Renal Stone Study  Result Date: 08/22/2018 CLINICAL DATA:  Left flank pain. Nausea vomiting for 4 days. Previous appendectomy. EXAM: CT ABDOMEN AND PELVIS WITHOUT CONTRAST TECHNIQUE: Multidetector CT imaging of the abdomen and pelvis was performed following the standard protocol without IV contrast. COMPARISON:  CT scan December 19, 2017 FINDINGS: Lower chest: No acute abnormality.  Hepatobiliary: No focal liver abnormality is seen. No gallstones, gallbladder wall thickening, or biliary dilatation. Pancreas: Unremarkable. No pancreatic ductal dilatation or surrounding inflammatory changes. Spleen: Normal in size without focal abnormality. Adrenals/Urinary Tract: Adrenal glands are normal. A 3 mm stone is again noted in the mid right kidney. No hydronephrosis or perinephric stranding. Bilateral extrarenal pelvises. No ureterectasis or ureteral stones noted. The bladder is unremarkable. Stomach/Bowel: The stomach is distended. Small bowel is normal. Moderate to severe fecal loading throughout the colon with a stool ball in the rectum. By report, the patient is status post appendectomy. 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 Atherosclerotic changes seen in the aorta, iliac vessels, and femoral vessels. Reproductive: Prostate is unremarkable. Other: No abdominal wall hernia or abnormality. No abdominopelvic ascites. Musculoskeletal: No acute or significant osseous findings. IMPRESSION: 1. Nonobstructive 3 mm stone in the right kidney. No renal obstruction or other stones noted. 2. Gastric distention of uncertain etiology. 3. Moderate to severe fecal loading throughout the colon. 4. Atherosclerotic changes throughout the abdominal aorta and iliac vessels. 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 Recommend followup by ultrasound in 3 years. This recommendation follows ACR consensus guidelines: White Paper of the ACR Incidental Findings Committee II on Vascular Findings. Joellyn Rued Radiol 2013; 66:294-765 Electronically Signed   By: Dorise Bullion III M.D   On: 08/22/2018 19:54    Ir Embo Art  Advance Guide Roadmapping  Result Date: 09/07/2018 INDICATION: 57 year old male with large volume upper GI bleed secondary to a very large duodenal ulcer. Patient underwent endoscopic intervention but continues to require pressors and large volume transfusion. He presents for mesenteric arteriography and embolization of any active bleeding and prophylactic embolization of the gastroduodenal artery. EXAM: IR ULTRASOUND GUIDANCE VASC ACCESS RIGHT; ADDITIONAL ARTERIOGRAPHY; IR EMBO ART VEN HEMORR LYMPH EXTRAV INC GUIDE ROADMAPPING; SELECTIVE VISCERAL ARTERIOGRAPHY 1. Ultrasound-guided vascular access 2. Catheterization of the left gastric artery where it arises directly from the aorta with arteriogram 3. Catheterization of the celiac artery with arteriogram 4. Catheterization of the common hepatic artery with arteriogram 5. Catheterization of the gastroduodenal artery with arteriogram 6. Coil embolization of the gastroduodenal artery 7. Catheterization of the superior mesenteric artery with arteriogram MEDICATIONS: None ANESTHESIA/SEDATION: Patient intubated and on propofol  drip CONTRAST:  70 mL Isovue 370 FLUOROSCOPY TIME:  Fluoroscopy Time: 11 minutes 30 seconds (487 mGy). COMPLICATIONS: None immediate. PROCEDURE: Informed consent was obtained from the patient following explanation of the procedure, risks, benefits and alternatives. The patient understands, agrees and consents for the procedure. All questions were addressed. A time out was performed prior to the initiation of the procedure. Maximal barrier sterile technique utilized including caps, mask, sterile gowns, sterile gloves, large sterile drape, hand hygiene, and Betadine prep. The right common femoral artery was interrogated with ultrasound and found to be widely patent. An image was obtained and stored for the medical record. Local anesthesia was attained by infiltration with 1% lidocaine. A small dermatotomy was  made. Under real-time sonographic guidance, the vessel was punctured with a 21 gauge micropuncture needle. Using standard technique, the initial micro needle was exchanged over a 0.018 micro wire for a transitional 4 [place] micro sheath. The micro sheath was then exchanged over a 0.035 wire for a 5 French vascular sheath. A C2 cobra catheter was advanced in the abdominal aorta over a Bentson wire. The catheter was used to select the first vessel arising from the ventral aorta. Contrast was injected. This is a left gastric artery arising directly from the aorta. Anatomy is normal. No evidence of active bleeding. The Cobra catheter was then used to select the origin of the celiac artery. Arteriography was performed. The splenic artery is small in caliber. The hepatic artery demonstrates normal hepatic arterial anatomy. The gastroduodenal artery is slightly irregular with spasm in the proximal segment. No evidence of active extravasation. A glidewire was successfully advanced into the common hepatic artery and the C2 cobra catheter was advanced into the common hepatic artery. Additional arteriography was performed in multiple obliquities. Again, the proximal gastroduodenal artery is irregular and spasmodic. No evidence of dissection, aneurysm or active bleeding. A Lantern microcatheter was then advanced over a Fathom 16 wire and used to select the distal aspect of the gastroduodenal artery. Arteriography was performed. The gastroepiploic arteries are widely patent. No evidence of active bleeding. Coil embolization was then performed using a 4 mm Ruby POD detachable micro coil followed by a 45 cm Ruby packing coil. Post embolization arteriography demonstrates complete occlusion of the vessel with stasis in the proximal segment. The Lantern microcatheter was brought back into the common hepatic artery and arteriography was performed. Again, complete occlusion of the gastroduodenal artery. The right gastric artery is  visualized. No evidence of active bleeding from the right gastric artery. The microcatheter was removed. The C2 cobra catheter was used to select the superior mesenteric artery. Arteriography was performed. Patent pancreaticoduodenal arcade supplying the gastroepiploic artery. No evidence of active hemorrhage. The C2 cobra catheter was removed. A limited right common femoral arteriogram was performed confirming common femoral arterial access. Hemostasis was attained with the assistance of a Cordis ExoSeal extra arterial vascular plug. IMPRESSION: 1. No evidence of active hemorrhage. 2. 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 3. Prophylactic coil embolization of the gastroduodenal artery. Signed, Criselda Peaches, MD, [facility] Radiology Electronically Signed   By: Jacqulynn Cadet M.D.   On: 09/07/2018 22:32   Ct Angio Abd/pel W/ And/or W/o  Result Date: 09/11/2018 CLINICAL DATA:  Status postoperative right popliteal and tibial embolectomy for embolic disease. Known bilateral iliac artery aneurysmal disease. EXAM: CT ANGIOGRAPHY ABDOMEN AND PELVIS WITH CONTRAST TECHNIQUE: Multidetector CT imaging of the abdomen and pelvis was performed using the standard protocol during  bolus administration of intravenous contrast. Multiplanar reconstructed images and MIPs were obtained and reviewed to evaluate the vascular anatomy. CONTRAST:  165m ISOVUE-370 IOPAMIDOL (ISOVUE-370) INJECTION 76% COMPARISON:  CTA of the abdomen on 01/30/2018 and CT of the abdomen and pelvis with contrast on 12/19/2017. FINDINGS: VASCULAR Aorta: Stable mild dilatation of the infrarenal aorta with maximal diameter of 3.1 cm. Celiac: Mild origin stenosis of 25-30%. Distal branches are patent. Status post transcatheter coil embolization of the gastroduodenal artery on 09/07/2018. Hepatic arteries are normally patent. SMA: Normally patent. Renals: Bilateral single  renal arteries demonstrate normal patency. IMA: Normally patent. Inflow: Fusiform aneurysmal disease present of bilateral common iliac arteries as previously demonstrated. Maximal caliber of the right common iliac artery is 2.8-2.9 cm and maximal caliber of the right common iliac artery is 2.7 cm. These measurements are stable since prior studies. The significant change since the prior studies is a much more prominent and new component of mural thrombus in both common iliac arteries, right greater than left. In the proximal right common iliac artery, mural thrombus narrows the aneurysmal lumen by at least 50% in its narrowest portion. Eccentric mural thrombus is present at the origin of the right common iliac artery and also distally just prior to the common iliac bifurcation without significant luminal stenosis. Aneurysmal disease again noted of bilateral internal iliac arteries with the left measuring approximately 2.5 cm in greatest diameter and the right measuring 1.5 cm. Both internal iliac arteries also demonstrate significant mural thrombus without occlusion. Mural thrombus is stable to slightly more prominent compared to the 12/19/2017 study. Bilateral external iliac arteries are normally patent and demonstrate diffuse arteriomegaly with maximum caliber 1.5 cm. Proximal Outflow: Bilateral common femoral arteries demonstrate dilatation with the left measuring up to 2.1 cm in diameter. Mural thrombus is present along the posterior lumen with approximately 50% reduction in luminal diameter. No thrombus is seen extending into proximal SFA or profunda femoral arteries. On the right side the common femoral artery measures up to 1.8 cm in the femoral bifurcation appears widely patent. Veins: Delayed venous phase imaging shows patent venous structures without evidence of thrombus. There is some probable mass effect of ectatic external iliac arteries on the distal aspects of bilateral external iliac veins without  evidence of associated deep venous thrombosis or significant collateral vein formation. Review of the MIP images confirms the above findings. NON-VASCULAR Lower chest: Small bilateral pleural effusions with bibasilar atelectasis. Hepatobiliary: No focal liver abnormality is seen. No gallstones, gallbladder wall thickening, or biliary dilatation. Pancreas: Since the March study, there is a change in appearance of the pancreas on the venous phase of imaging. The tail of the pancreas now appears progressively atrophic with associated pancreatic ductal dilatation up to approximately 7 mm. At the junction of the body and head of the pancreas, there is suggestion a potential ill-defined low-attenuation mass that could measure as much as 3 cm in AP diameter. Borders are very difficult to delineate by CT. There is no associated biliary obstruction. 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 Recommend eventual correlation with MRI of the abdomen with and without gadolinium. There may be a window of time recommended to wait to perform MRI after placement of the gastroduodenal artery Ruby coils. This can be further investigated based on manufacturer recommendation. If there may be a significant delay in ability to perform MRI, consider endoscopic ultrasound to investigate the pancreas. Spleen: Normal in size without focal abnormality. Adrenals/Urinary Tract: Adrenal glands are unremarkable. Kidneys are normal, without renal  calculi, focal lesion, or hydronephrosis. Bladder is unremarkable. Stomach/Bowel: Gastric fold thickening and prominent fold thickening of the duodenum may be consistent with known peptic ulcer disease. No evidence of bowel obstruction or free intraperitoneal air. No focal abscess identified. Lymphatic: No enlarged lymph nodes identified. Reproductive: Prostate is unremarkable. Other: Diffuse anasarca of the body wall and edema of the mesenteric and retroperitoneal fat likely relate to low albumin and  malnutrition. Musculoskeletal: Moderate degenerative disc disease at L5-S1. IMPRESSION: VASCULAR 1. 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 No complications evident after embolization. 2. Stable mild aneurysmal disease of the distal abdominal aorta measuring 3.1 cm. 3. 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 The major change since prior imaging is significant increase in mural thrombus within the dilated common iliac arteries bilaterally, especially on the right. This is most likely the source distal emboli to the right distal leg. Mural thrombus in the internal iliac artery aneurysms is stable to slightly more prominent. 4. Stable diffuse enlargement bilateral external iliac and common femoral arteries. The left common femoral artery does contain significant posterior wall mural thrombus. NON-VASCULAR 1. The most significant nonvascular finding is potential development of a mass of the pancreas at the juncture of the head and body causing progressive atrophy of the tail of the pancreas with associated pancreatic ductal dilatation. On the venous phase of imaging, delineation of the potential mass is quite vague but the region of mass may measure as much as 3 cm. There may be some issue with immediate performance of MRI in the setting recent coiling of the gastroduodenal artery even if the coils are MRI compatible. This can be checked with the manufacturer recommendation. If MRI can be performed immediately, an MRI of the abdomen with and without gadolinium would be helpful for further evaluation. If there is going to be significant delay, consider EUS characterization of the pancreas. 2. Diffuse body wall anasarca and edema of the mesenteric fat and retroperitoneum likely reflects low albumin and malnutrition. 3. Prominent gastric folds and duodenal folds without evidence of bowel obstruction or perforation. Electronically Signed   By:  Aletta Edouard M.D.   On: 09/11/2018 12:36     LOS: 13 days   Signature  Lala Lund M.D on 09/16/2018 at 9:04 AM   -  To page go to [email or web address] - password [facility]

## 2018-09-16 NOTE — Progress Notes (Signed)
PROGRESS NOTE        PATIENT DETAILS Name: Ryan Medina Age: 57 y.o. Sex: male Date of Birth: [birth year] Admit Date: 09/03/2018 Admitting Physician [name], MD BTD:HRCBULA, No Pcp Per  Brief Narrative: Patient is a 57 y.o. male with history of cocaine use, chronic systolic heart failure presented to the hospital on 12/13 with a right ischemic leg secondary to acute popliteal and tibial embolus, underwent embolectomy on 12/13 and subsequently was placed on a heparin drip.  He unfortunately developed a acute right lower leg hematoma with compartment syndrome requiring fasciotomy on 12/15.  Further hospital course was complicated by development of hemorrhagic shock with acute blood loss anemia secondary to a bleeding duodenal ulcer-requiring ICU transfer-intubation for airway protection and IR evaluation with mesenteric angiogram and GDA embolization.  Upon stability he was transferred back to the triad hospitalist service on 12/19.  See below for further details.  Subjective:  Patient in bed, appears comfortable, denies any headache, no fever, no chest pain or pressure, no shortness of breath , no abdominal pain. No focal weakness.  However did have bloody stools last night.  Assessment/Plan:  Right ischemic leg secondary to popliteal artery/tibial artery embolism-s/p embolectomy on 45/36 complicated by right lower extremity hematoma with compartment syndrome requiring fasciotomy on 12/15:   Source of embolization unclear, required fasciotomy on 09/05/2018 by vascular surgery status post closure of fasciotomy on 09/10/2018.  His course was complicated by large hematoma in the right leg along with significant life-threatening GI bleed. Case discussed with vascular surgeon Dr. Monica Martinez who wants to continue anticoagulation as much as possible.  Also TEE requested on 09/11/2018 to figure out etiology of his arterial embolus will likely be done after  09/16/2018 EUS due to severe esophagitis reported on EGD done this admission.  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  Kindly see anticoagulation below.  Upper GI bleeding with hemorrhagic shock and acute blood loss anemia: Developed hemorrhagic shock, was seen by GI, required multiple PRBC transfusions last transfusion on 09/09/2018.  Was seen by GI and eventually underwent gastroduodenal artery embolization on 09/07/2018.  Total 8 units of packed RBC, 1 unit of FFP and 1 unit of platelets transfused around 09/08/2018. GI note noted from 09/10/2018 (okay with heparin challenge).   Cautiously started on heparin drip on 09/12/2018 with stable H&H unfortunately on 09/14/2018 he had few episodes of hematemesis again with some drop in H&H after was stopped, he was replaced on IV PPI along with clear liquid diet, he has received 3 units of packed RBC between 09/14/2018 and 09/15/2018, currently H&H stable looks like bleeding might have stopped, case was discussed with on-call GI physician Dr. Collene Mares on 09/16/2015 and Dr. Tarri Glenn on 09/17/2015, already due for EUS on 09/16/2018.   Urinary retention.  On Foley and Flomax.  Foley removed on 09/15/2018.     Acute hypoxic respiratory failure: Intubated when he developed hemorrhagic shock secondary to GI bleeding, successfully extubated-currently stable on 2 L of oxygen via nasal cannula.  Chronic systolic heart failure (EF 45-50% by TTE on 09/04/2018): Reasonably well compensated-follow weights, volume status, electrolytes closely.  Cocaine use/tobacco use: Counseled-I am still not sure if he has any intention of quitting.  3 cm AAA: Stable for follow-up in the outpatient setting  New pancreatic mass noted on CT scan on 09/11/2018.  MRI noted & confirms mass, CA 19.9 has been elevated, GI seeing and planning to do EUS on 09/16/2018.  Insulin-dependent DM-2: Last A1c on 12/2 was 18.2.  Increase Lantus for better control  continue sliding scale.  CBG (last 3)  Recent Labs    09/15/18 1704 09/16/18 0019 09/16/18 0759  GLUCAP 93 118* 123*      DVT Prophylaxis: Heparin drip had to be stopped on 09/15/2018 due to GI bleed, will place SCDs in the left leg.  Code Status: Full code   Family Communication: Wife at bedside 12/25 & 12/26  Disposition Plan: Tele  Antimicrobial agents: Anti-infectives (From admission, onward)   Start     Dose/Rate Route Frequency Ordered Stop   09/10/18 2130  ceFAZolin (ANCEF) IVPB 2g/100 mL premix     2 g 200 mL/hr over 30 Minutes Intravenous Every 8 hours 09/10/18 1634 09/11/18 0700   09/10/18 0800  ceFAZolin (ANCEF) IVPB 1 g/50 mL premix    Note to Pharmacy:  Send with pt to OR   1 g 100 mL/hr over 30 Minutes Intravenous On call 09/09/18 0756 09/10/18 1407   09/07/18 1300  erythromycin 250 mg in sodium chloride 0.9 % 100 mL IVPB     250 mg 100 mL/hr over 60 Minutes Intravenous Once 09/07/18 1125 09/07/18 2351   09/06/18 0000  ceFAZolin (ANCEF) IVPB 1 g/50 mL premix    Note to Pharmacy:  Send with pt to OR   1 g 100 mL/hr over 30 Minutes Intravenous On call 09/05/18 0849 09/05/18 0952   09/03/18 2245  ceFAZolin (ANCEF) IVPB 2g/100 mL premix     2 g 200 mL/hr over 30 Minutes Intravenous Every 8 hours 09/03/18 2236 09/04/18 1444   09/03/18 1615  ceFAZolin (ANCEF) IVPB 2g/100 mL premix  Status:  Discontinued     2 g 200 mL/hr over 30 Minutes Intravenous On call to O.R. 09/03/18 1600 09/03/18 2035      Procedures:  12/17>>Mesenteric angiogram and coil embolization of the GDA  12/17>>EGD  12/15>> 1.  Evacuation hematoma right leg 2.  Evacuation of lymphocele right leg 3.  4 compartment fasciotomy 4.  Placement of VAC (medial and lateral)  12/13>> 1.  Ultrasound-guided access to the left common femoral artery 2.  Aortogram with bilateral iliac arteriogram 3.  Selective catheterization of the right external iliac artery with right lower extremity  runoff  MRI Abd.  Pancreatic mass.  EUS - 12/26     CONSULTS:  pulmonary/intensive care, vascular surgery and IR, GI, Cards  Time spent: 40 minutes-Greater than 50% of this time was spent in counseling, explanation of diagnosis, planning of further management, and coordination of care.  MEDICATIONS: Scheduled Meds: . atorvastatin  20 mg Oral Daily  . chlorhexidine gluconate (MEDLINE KIT)  15 mL Mouth Rinse BID  . insulin aspart  0-9 Units Subcutaneous TID WC  . insulin glargine  10 Units Subcutaneous Daily  . lidocaine  15 mL Oral Once  . mouth rinse  15 mL Mouth Rinse BID  . pantoprazole (PROTONIX) IV  40 mg Intravenous Q12H  . tamsulosin  0.4 mg Oral Daily   Continuous Infusions: . sodium chloride     PRN Meds:.sodium chloride, alum & mag hydroxide-simeth, famotidine, ipratropium-albuterol, LORazepam, [DISCONTINUED] ondansetron **OR** ondansetron (ZOFRAN) IV, oxyCODONE-acetaminophen, polyethylene glycol, senna-docusate, sodium chloride flush   PHYSICAL EXAM: Vital signs: Vitals:   09/15/18 2254 09/16/18 0113 09/16/18 0143 09/16/18 0339  BP: 127/84 (!) 126/94 126/89 131/89  Pulse: 92 (!)  56 86 85  Resp: _0 Temp: 98.9 F (37.2 C) 98.5 F (36.9 C) 98.5 F (36.9 C) 98.5 F (36.9 C)  TempSrc: Oral Oral Oral Oral  SpO2: 100% 100% 100% 100%  Weight:      Height:       Filed Weights   09/03/18 1650  Weight: 66.7 kg   Body mass index is 20.51 kg/m.   Exam  Awake Alert, Oriented X 3, No new F.N deficits, Normal affect [place].AT,PERRAL Supple Neck,No JVD, No cervical lymphadenopathy appriciated.  Symmetrical Chest wall movement, Good air movement bilaterally, CTAB RRR,No Gallops, Rubs or new Murmurs, No Parasternal Heave +ve B.Sounds, Abd Soft, No tenderness, No organomegaly appriciated, No rebound - guarding or rigidity. No Cyanosis, Clubbing or edema, R leg staples look good   I have personally reviewed following labs and imaging studies  LABORATORY  DATA: CBC: Recent Labs  Lab 09/12/18 1928  09/13/18 0352 09/14/18 0214 09/15/18 0427 09/15/18 1918 09/15/18 2016 09/16/18 0550  WBC 7.5  --  7.9 7.9 12.0*  --   --  10.7*  HGB 8.7*   < > 8.5* 8.6* 7.3* 6.6* 6.5* 9.1*  HCT 25.8*   < > 24.9* 25.8* 22.8* 20.0* 20.1* 27.4*  MCV 84.6  --  84.4 83.5 85.4  --   --  83.5  PLT 240  --  246 290 330  --   --  290   < > = values in this interval not displayed.    Basic Metabolic Panel: Recent Labs  Lab 09/10/18 0412  09/12/18 0415 09/13/18 0352 09/14/18 0214 09/15/18 0427 09/16/18 0550  NA 133*   < > 135 138 134* 135 134*  K 3.3*   < > 3.4* 3.4* 3.6 3.7 3.8  CL 102   < > 104 100 95* 96* 102  CO2 24   < > _1 GLUCOSE 221*   < > 297* 254* 263* 311* 138*  BUN 10   < > 10 6 <5* 29* 15  CREATININE 0.57*   < > 0.75 0.49* 0.62 0.74 0.69  CALCIUM 7.0*   < > 7.1* 7.4* 7.6* 7.4* 7.5*  MG 1.8  --  1.6* 1.7  --   --  1.7  PHOS 2.3*  --   --   --   --   --   --    < > = values in this interval not displayed.    GFR: Estimated Creatinine Clearance: 96.1 mL/min (by C-G formula based on SCr of 0.69 mg/dL).  Liver Function Tests: Recent Labs  Lab 09/12/18 0415 09/13/18 0352 09/14/18 0214 09/15/18 0427 09/16/18 0550  AST 20 26 32 38 33  ALT _2 ALKPHOS 52 53 59 53 49  BILITOT 0.5 0.6 0.8 0.7 1.0  PROT 3.7* 4.2* 4.8* 4.6* 4.4*  ALBUMIN 1.2* 1.4* 1.6* 1.6* 1.6*   No results for input(s): LIPASE, AMYLASE in the last 168 hours. No results for input(s): AMMONIA in the last 168 hours.  Coagulation Profile: No results for input(s): INR, PROTIME in the last 168 hours.  Cardiac Enzymes: No results for input(s): CKTOTAL, CKMB, CKMBINDEX, TROPONINI in the last 168 hours.  BNP (last 3 results) No results for input(s): PROBNP in the last 8760 hours.  HbA1C: No results for input(s): HGBA1C in the last 72 hours.  CBG: Recent Labs  Lab 09/15/18 1234 09/15/18 1440 09/15/18 1704 09/16/18 0019 09/16/18 5436  GLUCAP 195* 153* 93 118* 123*    Lipid Profile: No results for input(s): CHOL, HDL, LDLCALC, TRIG, CHOLHDL, LDLDIRECT in the last 72 hours.  Thyroid Function Tests: No results for input(s): TSH, T4TOTAL, FREET4, T3FREE, THYROIDAB in the last 72 hours.  Anemia Panel: No results for input(s): VITAMINB12, FOLATE, FERRITIN, TIBC, IRON, RETICCTPCT in the last 72 hours.  Urine analysis:    Component Value Date/Time   COLORURINE STRAW (A) 08/22/2018 2054   APPEARANCEUR CLEAR 08/22/2018 2054   LABSPEC 1.027 08/22/2018 2054   PHURINE 6.0 08/22/2018 2054   GLUCOSEU >=500 (A) 08/22/2018 2054   HGBUR NEGATIVE 08/22/2018 2054   [place] NEGATIVE 08/22/2018 2054   KETONESUR 80 (A) 08/22/2018 2054   PROTEINUR NEGATIVE 08/22/2018 2054   UROBILINOGEN 4.0 (H) 12/05/2016 0921   NITRITE NEGATIVE 08/22/2018 2054   LEUKOCYTESUR NEGATIVE 08/22/2018 2054    Sepsis Labs: Lactic Acid, Venous    Component Value Date/Time   LATICACIDVEN 1.7 09/07/2018 1600    MICROBIOLOGY: Recent Results (from the past 240 hour(s))  Surgical pcr screen     Status: None   Collection Time: 09/10/18  3:39 AM  Result Value Ref Range Status   MRSA, PCR NEGATIVE NEGATIVE Final   Staphylococcus aureus NEGATIVE NEGATIVE Final    Comment: (NOTE) The Xpert SA Assay (FDA approved for NASAL specimens in patients 55 years of age and older), is one component of a comprehensive surveillance program. It is not intended to diagnose infection nor to guide or monitor treatment. Performed at [facility], [place] [address] [place], [place] [address]     RADIOLOGY STUDIES/RESULTS: Dg Abd 1 View  Result Date: 09/07/2018 CLINICAL DATA:  OG tube placement. EXAM: ABDOMEN - 1 VIEW COMPARISON:  CT abdomen pelvis 08/22/2018. FINDINGS: OG tube is in place with the side port and tip in the body of the stomach. The stomach is distended. IMPRESSION: OG tube in good position. Electronically Signed   By: Inge Rise M.D.    On: 09/07/2018 13:31   Ct Angio Chest Pe W Or Wo Contrast  Result Date: 09/06/2018 CLINICAL DATA:  Evaluate thoracic aortic aneurysm. EXAM: CT ANGIOGRAPHY CHEST WITH CONTRAST TECHNIQUE: Multidetector CT imaging of the chest was performed using the standard protocol during bolus administration of intravenous contrast. Multiplanar CT image reconstructions and MIPs were obtained to evaluate the vascular anatomy. CONTRAST:  65 mL ISOVUE-370 IOPAMIDOL (ISOVUE-370) INJECTION 76% COMPARISON:  01/30/2018; 10/14/2016; 04/25/2026 FINDINGS: Vascular Findings: No evidence of thoracic aortic aneurysm with measurements as follows. No definite evidence of thoracic aortic dissection or periaortic stranding on this nongated examination. Bovine configuration of the aortic arch. The branch vessels of the aortic arch appear widely patent throughout their imaged courses. The descending thoracic aorta is of normal caliber and widely patent without hemodynamically significant stenosis. There is a very minimal amount of atherosclerotic plaque involving the proximal aspect of the descending thoracic aorta. Cardiomegaly.  No pericardial effusion. Although this examination was not tailored for the evaluation the pulmonary arteries, there are no discrete filling defects within the central pulmonary arterial tree to suggest central pulmonary embolism. Borderline enlarged caliber the main pulmonary artery measuring 32 mm in diameter (image 67, series 6) ------------------------------------------------------------- Thoracic aortic measurements: Sinotubular junction 33 mm as measured in greatest oblique coronal dimension. Proximal ascending aorta 35 mm as measured in greatest oblique axial dimension at the level of the main pulmonary artery (image 70, series 6); and approximately 36 mm in greatest oblique short axis coronal diameter (coronal  image 48, series 9). Aortic arch aorta 28 mm as measured in greatest oblique sagittal dimension.  Proximal descending thoracic aorta 30 mm as measured in greatest oblique axial dimension at the level of the main pulmonary artery. Distal descending thoracic aorta 29 mm as measured in greatest oblique axial dimension at the level of the diaphragmatic hiatus. Review of the MIP images confirms the above findings. ------------------------------------------------------------- Non-Vascular Findings: Mediastinum/Lymph Nodes: Prominent right hilar lymph nodes are not enlarged by size criteria with index right suprahilar lymph node measuring 0.8 cm in greatest short axis diameter (image 71, series 6) and index right infrahilar lymph node measuring 0.7 cm (image 88). No definitive bulky mediastinal, hilar or axillary lymphadenopathy. Lungs/Pleura: Interval development of small bilateral pleural effusions with worsening bibasilar consolidative opacities and associated air bronchograms, right greater than left. Interval development of an approximately 1.0 x 0.5 cm subpleural consolidative opacity within the right upper lobe (image 36, series 7). Unchanged punctate (approximately 3 mm) calcified granuloma within the left lower lobe (image 75, series 3). Evaluation for additional pulmonary nodules is degraded secondary to bibasilar airspace opacities. The central pulmonary airways appear widely patent. Upper abdomen: Fluid distention of the mid and distal aspects of the esophagus with marked distension of the imaged superior aspect of the stomach. Musculoskeletal: No acute or aggressive osseous abnormalities. There is incomplete fusion involving the spinous processes of multiple thoracic vertebral bodies, unchanged. Regional soft tissues appear normal. Normal appearance of the thyroid gland. IMPRESSION: 1. No evidence of thoracic aortic aneurysm or dissection. 2. Small bilateral effusions with associated bibasilar consolidative opacities with associated air bronchograms, atelectasis versus infiltrate. Follow-up chest  radiograph in 4-6 weeks after treatment is recommended to ensure resolution. 3. Fluid distension of the mid and distal aspects of the esophagus as well as marked distension of the imaged superior aspect stomach - correlation for gastric outlet obstructive symptoms is advised. 4. Cardiomegaly with enlargement of the caliber the main pulmonary artery as could be seen in the setting of pulmonary arterial hypertension. Electronically Signed   By: John  Watts M.D.   On: 09/06/2018 09:20   Mr Abdomen W Wo Contrast  Result Date: 09/12/2018 CLINICAL DATA:  Suspicion of pancreatic neck mass on CT. EXAM: MRI ABDOMEN WITHOUT AND WITH CONTRAST TECHNIQUE: Multiplanar multisequence MR imaging of the abdomen was performed both before and after the administration of intravenous contrast. CONTRAST:  6 cc Gadavist COMPARISON:  CT of 1 day prior. Stone study of 08/22/2018. More remote CT of 12/19/2017. FINDINGS: Moderatemultifactorial degradation, including respiratory motion and susceptibility artifact from embolization coils. Lower chest: Bilateral pleural fluid with bibasilar airspace disease. Normal heart size. Hepatobiliary: Left hepatic lobe 9 mm cyst. Too small to characterizea right hepatic lobe lesion is most likely a cyst at 2 mm. Normal gallbladder, without biliary ductal dilatation. Pancreas: An area of soft tissue fullness and restricted diffusion is identified within the pancreatic neck, corresponding to the abnormality on CT. This measures on the order of 3.9 x 3.6 cm on image 76/12. Also 3.6 x 3.6 cm on image 35/31. Results in pancreatic duct dilatation within the upstream body and tail on image 17/11. Involves the celiac and its branches including on image 75/12. Spleen:  Grossly within normal limits. Adrenals/Urinary Tract: Normal adrenal glands. Grossly normal kidneys, without hydronephrosis. Stomach/Bowel: Gastric distension with proximal wall and fold thickening, including on image 73/12. Grossly normal  abdominal bowel loops. Vascular/Lymphatic: Normal caliber of the aorta. 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 Limited evaluation for   abdominal adenopathy. Other:  Small volume abdominal ascites. Musculoskeletal: No acute osseous abnormality. IMPRESSION: 1. Moderate motion and susceptibility artifact degradation throughout. 2. Confirmation of a pancreatic neck mass, most consistent with adenocarcinoma. Involvement of the celiac and its branches, most consistent with a non-operative lesion. 3. Consider endoscopic ultrasound sampling. 4. Gastric wall and fold thickening, suggesting gastritis. 5. Small bilateral pleural effusions with adjacent airspace disease. Electronically Signed   By: Abigail Miyamoto M.D.   On: 09/12/2018 03:04   Ir Angiogram Visceral Selective  Result Date: 09/07/2018 INDICATION: 57 year old male with large volume upper GI bleed secondary to a very large duodenal ulcer. Patient underwent endoscopic intervention but continues to require pressors and large volume transfusion. He presents for mesenteric arteriography and embolization of any active bleeding and prophylactic embolization of the gastroduodenal artery. EXAM: IR ULTRASOUND GUIDANCE VASC ACCESS RIGHT; ADDITIONAL ARTERIOGRAPHY; IR EMBO ART VEN HEMORR LYMPH EXTRAV INC GUIDE ROADMAPPING; SELECTIVE VISCERAL ARTERIOGRAPHY 1. Ultrasound-guided vascular access 2. Catheterization of the left gastric artery where it arises directly from the aorta with arteriogram 3. Catheterization of the celiac artery with arteriogram 4. Catheterization of the common hepatic artery with arteriogram 5. Catheterization of the gastroduodenal artery with arteriogram 6. Coil embolization of the gastroduodenal artery 7. Catheterization of the superior mesenteric artery with arteriogram MEDICATIONS: None ANESTHESIA/SEDATION: Patient intubated and on propofol drip CONTRAST:  70 mL Isovue 370 FLUOROSCOPY TIME:  Fluoroscopy Time: 11 minutes 30 seconds (487  mGy). COMPLICATIONS: None immediate. PROCEDURE: Informed consent was obtained from the patient following explanation of the procedure, risks, benefits and alternatives. The patient understands, agrees and consents for the procedure. All questions were addressed. A time out was performed prior to the initiation of the procedure. Maximal barrier sterile technique utilized including caps, mask, sterile gowns, sterile gloves, large sterile drape, hand hygiene, and Betadine prep. The right common femoral artery was interrogated with ultrasound and found to be widely patent. An image was obtained and stored for the medical record. Local anesthesia was attained by infiltration with 1% lidocaine. A small dermatotomy was made. Under real-time sonographic guidance, the vessel was punctured with a 21 gauge micropuncture needle. Using standard technique, the initial micro needle was exchanged over a 0.018 micro wire for a transitional 4 [place] micro sheath. The micro sheath was then exchanged over a 0.035 wire for a 5 French vascular sheath. A C2 cobra catheter was advanced in the abdominal aorta over a Bentson wire. The catheter was used to select the first vessel arising from the ventral aorta. Contrast was injected. This is a left gastric artery arising directly from the aorta. Anatomy is normal. No evidence of active bleeding. The Cobra catheter was then used to select the origin of the celiac artery. Arteriography was performed. The splenic artery is small in caliber. The hepatic artery demonstrates normal hepatic arterial anatomy. The gastroduodenal artery is slightly irregular with spasm in the proximal segment. No evidence of active extravasation. A glidewire was successfully advanced into the common hepatic artery and the C2 cobra catheter was advanced into the common hepatic artery. Additional arteriography was performed in multiple obliquities. Again, the proximal gastroduodenal artery is irregular and spasmodic. No  evidence of dissection, aneurysm or active bleeding. A Lantern microcatheter was then advanced over a Fathom 16 wire and used to select the distal aspect of the gastroduodenal artery. Arteriography was performed. The gastroepiploic arteries are widely patent. No evidence of active bleeding. Coil embolization was then performed using a 4 mm Ruby POD detachable micro coil  followed by a 45 cm Ruby packing coil. Post embolization arteriography demonstrates complete occlusion of the vessel with stasis in the proximal segment. The Lantern microcatheter was brought back into the common hepatic artery and arteriography was performed. Again, complete occlusion of the gastroduodenal artery. The right gastric artery is visualized. No evidence of active bleeding from the right gastric artery. The microcatheter was removed. The C2 cobra catheter was used to select the superior mesenteric artery. Arteriography was performed. Patent pancreaticoduodenal arcade supplying the gastroepiploic artery. No evidence of active hemorrhage. The C2 cobra catheter was removed. A limited right common femoral arteriogram was performed confirming common femoral arterial access. Hemostasis was attained with the assistance of a Cordis ExoSeal extra arterial vascular plug. IMPRESSION: 1. No evidence of active hemorrhage. 2. 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 3. Prophylactic coil embolization of the gastroduodenal artery. Signed, Criselda Peaches, MD, [facility] Electronically Signed   By: Jacqulynn Cadet M.D.   On: 09/07/2018 22:32   Ir Angiogram Visceral Selective  Result Date: 09/07/2018 INDICATION: 57 year old male with large volume upper GI bleed secondary to a very large duodenal ulcer. Patient underwent endoscopic intervention but continues to require pressors and large volume transfusion. He presents for mesenteric arteriography and  embolization of any active bleeding and prophylactic embolization of the gastroduodenal artery. EXAM: IR ULTRASOUND GUIDANCE VASC ACCESS RIGHT; ADDITIONAL ARTERIOGRAPHY; IR EMBO ART VEN HEMORR LYMPH EXTRAV INC GUIDE ROADMAPPING; SELECTIVE VISCERAL ARTERIOGRAPHY 1. Ultrasound-guided vascular access 2. Catheterization of the left gastric artery where it arises directly from the aorta with arteriogram 3. Catheterization of the celiac artery with arteriogram 4. Catheterization of the common hepatic artery with arteriogram 5. Catheterization of the gastroduodenal artery with arteriogram 6. Coil embolization of the gastroduodenal artery 7. Catheterization of the superior mesenteric artery with arteriogram MEDICATIONS: None ANESTHESIA/SEDATION: Patient intubated and on propofol drip CONTRAST:  70 mL Isovue 370 FLUOROSCOPY TIME:  Fluoroscopy Time: 11 minutes 30 seconds (487 mGy). COMPLICATIONS: None immediate. PROCEDURE: Informed consent was obtained from the patient following explanation of the procedure, risks, benefits and alternatives. The patient understands, agrees and consents for the procedure. All questions were addressed. A time out was performed prior to the initiation of the procedure. Maximal barrier sterile technique utilized including caps, mask, sterile gowns, sterile gloves, large sterile drape, hand hygiene, and Betadine prep. The right common femoral artery was interrogated with ultrasound and found to be widely patent. An image was obtained and stored for the medical record. Local anesthesia was attained by infiltration with 1% lidocaine. A small dermatotomy was made. Under real-time sonographic guidance, the vessel was punctured with a 21 gauge micropuncture needle. Using standard technique, the initial micro needle was exchanged over a 0.018 micro wire for a transitional 4 [place] micro sheath. The micro sheath was then exchanged over a 0.035 wire for a 5 French vascular sheath. A C2 cobra catheter was  advanced in the abdominal aorta over a Bentson wire. The catheter was used to select the first vessel arising from the ventral aorta. Contrast was injected. This is a left gastric artery arising directly from the aorta. Anatomy is normal. No evidence of active bleeding. The Cobra catheter was then used to select the origin of the celiac artery. Arteriography was performed. The splenic artery is small in caliber. The hepatic artery demonstrates normal hepatic arterial anatomy. The gastroduodenal artery is slightly irregular with spasm in the proximal segment. No evidence of active extravasation. A glidewire was  successfully advanced into the common hepatic artery and the C2 cobra catheter was advanced into the common hepatic artery. Additional arteriography was performed in multiple obliquities. Again, the proximal gastroduodenal artery is irregular and spasmodic. No evidence of dissection, aneurysm or active bleeding. A Lantern microcatheter was then advanced over a Fathom 16 wire and used to select the distal aspect of the gastroduodenal artery. Arteriography was performed. The gastroepiploic arteries are widely patent. No evidence of active bleeding. Coil embolization was then performed using a 4 mm Ruby POD detachable micro coil followed by a 45 cm Ruby packing coil. Post embolization arteriography demonstrates complete occlusion of the vessel with stasis in the proximal segment. The Lantern microcatheter was brought back into the common hepatic artery and arteriography was performed. Again, complete occlusion of the gastroduodenal artery. The right gastric artery is visualized. No evidence of active bleeding from the right gastric artery. The microcatheter was removed. The C2 cobra catheter was used to select the superior mesenteric artery. Arteriography was performed. Patent pancreaticoduodenal arcade supplying the gastroepiploic artery. No evidence of active hemorrhage. The C2 cobra catheter was removed. A  limited right common femoral arteriogram was performed confirming common femoral arterial access. Hemostasis was attained with the assistance of a Cordis ExoSeal extra arterial vascular plug. IMPRESSION: 1. No evidence of active hemorrhage. 2. 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 3. Prophylactic coil embolization of the gastroduodenal artery. Signed, Criselda Peaches, MD, [place] [facility] Radiology Electronically Signed   By: Jacqulynn Cadet M.D.   On: 09/07/2018 22:32   Ir Angiogram Selective Each Additional Vessel  Result Date: 09/07/2018 INDICATION: 57 year old male with large volume upper GI bleed secondary to a very large duodenal ulcer. Patient underwent endoscopic intervention but continues to require pressors and large volume transfusion. He presents for mesenteric arteriography and embolization of any active bleeding and prophylactic embolization of the gastroduodenal artery. EXAM: IR ULTRASOUND GUIDANCE VASC ACCESS RIGHT; ADDITIONAL ARTERIOGRAPHY; IR EMBO ART VEN HEMORR LYMPH EXTRAV INC GUIDE ROADMAPPING; SELECTIVE VISCERAL ARTERIOGRAPHY 1. Ultrasound-guided vascular access 2. Catheterization of the left gastric artery where it arises directly from the aorta with arteriogram 3. Catheterization of the celiac artery with arteriogram 4. Catheterization of the common hepatic artery with arteriogram 5. Catheterization of the gastroduodenal artery with arteriogram 6. Coil embolization of the gastroduodenal artery 7. Catheterization of the superior mesenteric artery with arteriogram MEDICATIONS: None ANESTHESIA/SEDATION: Patient intubated and on propofol drip CONTRAST:  70 mL Isovue 370 FLUOROSCOPY TIME:  Fluoroscopy Time: 11 minutes 30 seconds (487 mGy). COMPLICATIONS: None immediate. PROCEDURE: Informed consent was obtained from the patient following explanation of the procedure, risks, benefits and alternatives. The patient  understands, agrees and consents for the procedure. All questions were addressed. A time out was performed prior to the initiation of the procedure. Maximal barrier sterile technique utilized including caps, mask, sterile gowns, sterile gloves, large sterile drape, hand hygiene, and Betadine prep. The right common femoral artery was interrogated with ultrasound and found to be widely patent. An image was obtained and stored for the medical record. Local anesthesia was attained by infiltration with 1% lidocaine. A small dermatotomy was made. Under real-time sonographic guidance, the vessel was punctured with a 21 gauge micropuncture needle. Using standard technique, the initial micro needle was exchanged over a 0.018 micro wire for a transitional 4 [place] micro sheath. The micro sheath was then exchanged over a 0.035 wire for a 5 French vascular sheath. A C2 cobra catheter was advanced in  the abdominal aorta over a Bentson wire. The catheter was used to select the first vessel arising from the ventral aorta. Contrast was injected. This is a left gastric artery arising directly from the aorta. Anatomy is normal. No evidence of active bleeding. The Cobra catheter was then used to select the origin of the celiac artery. Arteriography was performed. The splenic artery is small in caliber. The hepatic artery demonstrates normal hepatic arterial anatomy. The gastroduodenal artery is slightly irregular with spasm in the proximal segment. No evidence of active extravasation. A glidewire was successfully advanced into the common hepatic artery and the C2 cobra catheter was advanced into the common hepatic artery. Additional arteriography was performed in multiple obliquities. Again, the proximal gastroduodenal artery is irregular and spasmodic. No evidence of dissection, aneurysm or active bleeding. A Lantern microcatheter was then advanced over a Fathom 16 wire and used to select the distal aspect of the gastroduodenal  artery. Arteriography was performed. The gastroepiploic arteries are widely patent. No evidence of active bleeding. Coil embolization was then performed using a 4 mm Ruby POD detachable micro coil followed by a 45 cm Ruby packing coil. Post embolization arteriography demonstrates complete occlusion of the vessel with stasis in the proximal segment. The Lantern microcatheter was brought back into the common hepatic artery and arteriography was performed. Again, complete occlusion of the gastroduodenal artery. The right gastric artery is visualized. No evidence of active bleeding from the right gastric artery. The microcatheter was removed. The C2 cobra catheter was used to select the superior mesenteric artery. Arteriography was performed. Patent pancreaticoduodenal arcade supplying the gastroepiploic artery. No evidence of active hemorrhage. The C2 cobra catheter was removed. A limited right common femoral arteriogram was performed confirming common femoral arterial access. Hemostasis was attained with the assistance of a Cordis ExoSeal extra arterial vascular plug. IMPRESSION: 1. No evidence of active hemorrhage. 2. 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 3. Prophylactic coil embolization of the gastroduodenal artery. Signed, Criselda Peaches, MD, [place] [facility] [place] Radiology Electronically Signed   By: Jacqulynn Cadet M.D.   On: 09/07/2018 22:32   Ir Angiogram Selective Each Additional Vessel  Result Date: 09/07/2018 INDICATION: 57 year old male with large volume upper GI bleed secondary to a very large duodenal ulcer. Patient underwent endoscopic intervention but continues to require pressors and large volume transfusion. He presents for mesenteric arteriography and embolization of any active bleeding and prophylactic embolization of the gastroduodenal artery. EXAM: IR ULTRASOUND GUIDANCE VASC ACCESS RIGHT; ADDITIONAL ARTERIOGRAPHY;  IR EMBO ART VEN HEMORR LYMPH EXTRAV INC GUIDE ROADMAPPING; SELECTIVE VISCERAL ARTERIOGRAPHY 1. Ultrasound-guided vascular access 2. Catheterization of the left gastric artery where it arises directly from the aorta with arteriogram 3. Catheterization of the celiac artery with arteriogram 4. Catheterization of the common hepatic artery with arteriogram 5. Catheterization of the gastroduodenal artery with arteriogram 6. Coil embolization of the gastroduodenal artery 7. Catheterization of the superior mesenteric artery with arteriogram MEDICATIONS: None ANESTHESIA/SEDATION: Patient intubated and on propofol drip CONTRAST:  70 mL Isovue 370 FLUOROSCOPY TIME:  Fluoroscopy Time: 11 minutes 30 seconds (487 mGy). COMPLICATIONS: None immediate. PROCEDURE: Informed consent was obtained from the patient following explanation of the procedure, risks, benefits and alternatives. The patient understands, agrees and consents for the procedure. All questions were addressed. A time out was performed prior to the initiation of the procedure. Maximal barrier sterile technique utilized including caps, mask, sterile gowns, sterile gloves, large sterile drape, hand hygiene, and Betadine prep. The right  common femoral artery was interrogated with ultrasound and found to be widely patent. An image was obtained and stored for the medical record. Local anesthesia was attained by infiltration with 1% lidocaine. A small dermatotomy was made. Under real-time sonographic guidance, the vessel was punctured with a 21 gauge micropuncture needle. Using standard technique, the initial micro needle was exchanged over a 0.018 micro wire for a transitional 4 [place] micro sheath. The micro sheath was then exchanged over a 0.035 wire for a 5 French vascular sheath. A C2 cobra catheter was advanced in the abdominal aorta over a Bentson wire. The catheter was used to select the first vessel arising from the ventral aorta. Contrast was injected. This is a  left gastric artery arising directly from the aorta. Anatomy is normal. No evidence of active bleeding. The Cobra catheter was then used to select the origin of the celiac artery. Arteriography was performed. The splenic artery is small in caliber. The hepatic artery demonstrates normal hepatic arterial anatomy. The gastroduodenal artery is slightly irregular with spasm in the proximal segment. No evidence of active extravasation. A glidewire was successfully advanced into the common hepatic artery and the C2 cobra catheter was advanced into the common hepatic artery. Additional arteriography was performed in multiple obliquities. Again, the proximal gastroduodenal artery is irregular and spasmodic. No evidence of dissection, aneurysm or active bleeding. A Lantern microcatheter was then advanced over a Fathom 16 wire and used to select the distal aspect of the gastroduodenal artery. Arteriography was performed. The gastroepiploic arteries are widely patent. No evidence of active bleeding. Coil embolization was then performed using a 4 mm Ruby POD detachable micro coil followed by a 45 cm Ruby packing coil. Post embolization arteriography demonstrates complete occlusion of the vessel with stasis in the proximal segment. The Lantern microcatheter was brought back into the common hepatic artery and arteriography was performed. Again, complete occlusion of the gastroduodenal artery. The right gastric artery is visualized. No evidence of active bleeding from the right gastric artery. The microcatheter was removed. The C2 cobra catheter was used to select the superior mesenteric artery. Arteriography was performed. Patent pancreaticoduodenal arcade supplying the gastroepiploic artery. No evidence of active hemorrhage. The C2 cobra catheter was removed. A limited right common femoral arteriogram was performed confirming common femoral arterial access. Hemostasis was attained with the assistance of a Cordis ExoSeal extra  arterial vascular plug. IMPRESSION: 1. No evidence of active hemorrhage. 2. 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 3. Prophylactic coil embolization of the gastroduodenal artery. Signed, Criselda Peaches, MD, [place] [facility] Radiology Electronically Signed   By: Jacqulynn Cadet M.D.   On: 09/07/2018 22:32   Ir US Guide Vasc Access Right  Result Date: 09/07/2018 INDICATION: 57 year old male with large volume upper GI bleed secondary to a very large duodenal ulcer. Patient underwent endoscopic intervention but continues to require pressors and large volume transfusion. He presents for mesenteric arteriography and embolization of any active bleeding and prophylactic embolization of the gastroduodenal artery. EXAM: IR ULTRASOUND GUIDANCE VASC ACCESS RIGHT; ADDITIONAL ARTERIOGRAPHY; IR EMBO ART VEN HEMORR LYMPH EXTRAV INC GUIDE ROADMAPPING; SELECTIVE VISCERAL ARTERIOGRAPHY 1. Ultrasound-guided vascular access 2. Catheterization of the left gastric artery where it arises directly from the aorta with arteriogram 3. Catheterization of the celiac artery with arteriogram 4. Catheterization of the common hepatic artery with arteriogram 5. Catheterization of the gastroduodenal artery with arteriogram 6. Coil embolization of the gastroduodenal artery 7. Catheterization of the superior mesenteric artery  with arteriogram MEDICATIONS: None ANESTHESIA/SEDATION: Patient intubated and on propofol drip CONTRAST:  70 mL Isovue 370 FLUOROSCOPY TIME:  Fluoroscopy Time: 11 minutes 30 seconds (487 mGy). COMPLICATIONS: None immediate. PROCEDURE: Informed consent was obtained from the patient following explanation of the procedure, risks, benefits and alternatives. The patient understands, agrees and consents for the procedure. All questions were addressed. A time out was performed prior to the initiation of the procedure. Maximal barrier sterile technique  utilized including caps, mask, sterile gowns, sterile gloves, large sterile drape, hand hygiene, and Betadine prep. The right common femoral artery was interrogated with ultrasound and found to be widely patent. An image was obtained and stored for the medical record. Local anesthesia was attained by infiltration with 1% lidocaine. A small dermatotomy was made. Under real-time sonographic guidance, the vessel was punctured with a 21 gauge micropuncture needle. Using standard technique, the initial micro needle was exchanged over a 0.018 micro wire for a transitional 4 French micro sheath. The micro sheath was then exchanged over a 0.035 wire for a 5 French vascular sheath. A C2 cobra catheter was advanced in the abdominal aorta over a Bentson wire. The catheter was used to select the first vessel arising from the ventral aorta. Contrast was injected. This is a left gastric artery arising directly from the aorta. Anatomy is normal. No evidence of active bleeding. The Cobra catheter was then used to select the origin of the celiac artery. Arteriography was performed. The splenic artery is small in caliber. The hepatic artery demonstrates normal hepatic arterial anatomy. The gastroduodenal artery is slightly irregular with spasm in the proximal segment. No evidence of active extravasation. A glidewire was successfully advanced into the common hepatic artery and the C2 cobra catheter was advanced into the common hepatic artery. Additional arteriography was performed in multiple obliquities. Again, the proximal gastroduodenal artery is irregular and spasmodic. No evidence of dissection, aneurysm or active bleeding. A Lantern microcatheter was then advanced over a Fathom 16 wire and used to select the distal aspect of the gastroduodenal artery. Arteriography was performed. The gastroepiploic arteries are widely patent. No evidence of active bleeding. Coil embolization was then performed using a 4 mm Ruby POD detachable  micro coil followed by a 45 cm Ruby packing coil. Post embolization arteriography demonstrates complete occlusion of the vessel with stasis in the proximal segment. The Lantern microcatheter was brought back into the common hepatic artery and arteriography was performed. Again, complete occlusion of the gastroduodenal artery. The right gastric artery is visualized. No evidence of active bleeding from the right gastric artery. The microcatheter was removed. The C2 cobra catheter was used to select the superior mesenteric artery. Arteriography was performed. Patent pancreaticoduodenal arcade supplying the gastroepiploic artery. No evidence of active hemorrhage. The C2 cobra catheter was removed. A limited right common femoral arteriogram was performed confirming common femoral arterial access. Hemostasis was attained with the assistance of a Cordis ExoSeal extra arterial vascular plug. IMPRESSION: 1. No evidence of active hemorrhage. 2. 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 3. Prophylactic coil embolization of the gastroduodenal artery. Signed, Heath K. McCullough, MD, [facility] [place] [facility] Electronically Signed   By: Heath  McCullough M.D.   On: 09/07/2018 22:32   Dg Chest Port 1 View  Result Date: 09/09/2018 CLINICAL DATA:  Respiratory failure EXAM: PORTABLE CHEST 1 VIEW COMPARISON:  09/07/2018 FINDINGS: Endotracheal tube and nasogastric catheter have been removed in the interval. Left jugular central line is again noted in the   distal superior vena cava. The cardiac shadow is stable. The lungs are well aerated bilaterally with patchy bibasilar infiltrates and small left pleural effusion. IMPRESSION: Stable bibasilar infiltrates and small left pleural effusion. Electronically Signed   By: Inez Catalina M.D.   On: 09/09/2018 07:17   Dg Chest Port 1 View  Result Date: 09/07/2018 CLINICAL DATA:  Status post fasciotomy and hematoma  back UA shins in the right lower leg 2 days ago. Intubated patient. EXAM: PORTABLE CHEST 1 VIEW COMPARISON:  CT scan of the chest of September 06, 2018 FINDINGS: The lungs are well-expanded. There are patchy increased densities at both bases which are slightly more conspicuous today. There is a small right and and somewhat larger left pleural effusion. The heart and pulmonary vascularity are normal. The endotracheal tube tip projects 4.1 cm above the carina. The esophagogastric tube tip in proximal port project below the GE junction. The left internal jugular venous catheter tip projects over the midportion of the SVC. IMPRESSION: Bibasilar atelectasis or pneumonia. Small right pleural effusion and somewhat larger left pleural effusion. The support tubes are in reasonable position. Electronically Signed   By: David  Martinique M.D.   On: 09/07/2018 13:27   Vas [place] Burnard Bunting With/wo Tbi  Result Date: 09/15/2018 LOWER EXTREMITY DOPPLER STUDY Indications: Peripheral artery disease.  Vascular Interventions: Status post right popliteal - tibial embolectomy and                         fasciotomy. Comparison Study: No exam for comparison Performing Technologist: Birdena Crandall, [place] [facility]  Examination Guidelines: A complete evaluation includes at minimum, Doppler waveform signals and systolic blood pressure reading at the level of bilateral brachial, anterior tibial, and posterior tibial arteries, when vessel segments are accessible. Bilateral testing is considered an integral part of a complete examination. Photoelectric Plethysmograph (PPG) waveforms and toe systolic pressure readings are included as required and additional duplex testing as needed. Limited examinations for reoccurring indications may be performed as noted.  ABI Findings: +--------+------------------+-----+----------+--------+ Right   Rt Pressure (mmHg)IndexWaveform  Comment  +--------+------------------+-----+----------+--------+ [id number]                     triphasic          +--------+------------------+-----+----------+--------+ PTA     179               1.36 triphasic          +--------+------------------+-----+----------+--------+ DP      152               1.15 monophasic         +--------+------------------+-----+----------+--------+ +--------+------------------+-----+---------+-------+ Left    Lt Pressure (mmHg)IndexWaveform Comment +--------+------------------+-----+---------+-------+ [id number]                    triphasic        +--------+------------------+-----+---------+-------+ PTA     176               1.33 triphasic        +--------+------------------+-----+---------+-------+ DP      162               1.23 triphasic        +--------+------------------+-----+---------+-------+ +-------+-----------+-----------+------------+------------+ ABI/TBIToday's ABIToday's TBIPrevious ABIPrevious TBI +-------+-----------+-----------+------------+------------+ Right  1.36                                           +-------+-----------+-----------+------------+------------+  Left   1.33                                           +-------+-----------+-----------+------------+------------+  Summary: Right: Resting right ankle-brachial index indicates noncompressible right lower extremity arteries. Left: Resting left ankle-brachial index indicates noncompressible left lower extremity arteries.  *See table(s) above for measurements and observations.  Electronically signed by Harold Barban MD on 09/15/2018 at 1:50:47 PM.   Final    Ct Renal Stone Study  Result Date: 08/22/2018 CLINICAL DATA:  Left flank pain. Nausea vomiting for 4 days. Previous appendectomy. EXAM: CT ABDOMEN AND PELVIS WITHOUT CONTRAST TECHNIQUE: Multidetector CT imaging of the abdomen and pelvis was performed following the standard protocol without IV contrast. COMPARISON:  CT scan December 19, 2017 FINDINGS: Lower chest: No acute abnormality.  Hepatobiliary: No focal liver abnormality is seen. No gallstones, gallbladder wall thickening, or biliary dilatation. Pancreas: Unremarkable. No pancreatic ductal dilatation or surrounding inflammatory changes. Spleen: Normal in size without focal abnormality. Adrenals/Urinary Tract: Adrenal glands are normal. A 3 mm stone is again noted in the mid right kidney. No hydronephrosis or perinephric stranding. Bilateral extrarenal pelvises. No ureterectasis or ureteral stones noted. The bladder is unremarkable. Stomach/Bowel: The stomach is distended. Small bowel is normal. Moderate to severe fecal loading throughout the colon with a stool ball in the rectum. By report, the patient is status post appendectomy. 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 Atherosclerotic changes seen in the aorta, iliac vessels, and femoral vessels. Reproductive: Prostate is unremarkable. Other: No abdominal wall hernia or abnormality. No abdominopelvic ascites. Musculoskeletal: No acute or significant osseous findings. IMPRESSION: 1. Nonobstructive 3 mm stone in the right kidney. No renal obstruction or other stones noted. 2. Gastric distention of uncertain etiology. 3. Moderate to severe fecal loading throughout the colon. 4. Atherosclerotic changes throughout the abdominal aorta and iliac vessels. 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 Recommend followup by ultrasound in 3 years. This recommendation follows ACR consensus guidelines: White Paper of the ACR Incidental Findings Committee II on Vascular Findings. Joellyn Rued Radiol 2013; 66:294-765 Electronically Signed   By: Dorise Bullion III M.D   On: 08/22/2018 19:54    Ir Embo Art  Advance Guide Roadmapping  Result Date: 09/07/2018 INDICATION: 57 year old male with large volume upper GI bleed secondary to a very large duodenal ulcer. Patient underwent endoscopic intervention but continues to require pressors and large volume transfusion. He presents for mesenteric arteriography and embolization of any active bleeding and prophylactic embolization of the gastroduodenal artery. EXAM: IR ULTRASOUND GUIDANCE VASC ACCESS RIGHT; ADDITIONAL ARTERIOGRAPHY; IR EMBO ART VEN HEMORR LYMPH EXTRAV INC GUIDE ROADMAPPING; SELECTIVE VISCERAL ARTERIOGRAPHY 1. Ultrasound-guided vascular access 2. Catheterization of the left gastric artery where it arises directly from the aorta with arteriogram 3. Catheterization of the celiac artery with arteriogram 4. Catheterization of the common hepatic artery with arteriogram 5. Catheterization of the gastroduodenal artery with arteriogram 6. Coil embolization of the gastroduodenal artery 7. Catheterization of the superior mesenteric artery with arteriogram MEDICATIONS: None ANESTHESIA/SEDATION: Patient intubated and on propofol  drip CONTRAST:  70 mL Isovue 370 FLUOROSCOPY TIME:  Fluoroscopy Time: 11 minutes 30 seconds (487 mGy). COMPLICATIONS: None immediate. PROCEDURE: Informed consent was obtained from the patient following explanation of the procedure, risks, benefits and alternatives. The patient understands, agrees and consents for the procedure. All questions were addressed. A time out was performed prior to the initiation of the procedure. Maximal barrier sterile technique utilized including caps, mask, sterile gowns, sterile gloves, large sterile drape, hand hygiene, and Betadine prep. The right common femoral artery was interrogated with ultrasound and found to be widely patent. An image was obtained and stored for the medical record. Local anesthesia was attained by infiltration with 1% lidocaine. A small dermatotomy was  made. Under real-time sonographic guidance, the vessel was punctured with a 21 gauge micropuncture needle. Using standard technique, the initial micro needle was exchanged over a 0.018 micro wire for a transitional 4 French micro sheath. The micro sheath was then exchanged over a 0.035 wire for a 5 French vascular sheath. A C2 cobra catheter was advanced in the abdominal aorta over a Bentson wire. The catheter was used to select the first vessel arising from the ventral aorta. Contrast was injected. This is a left gastric artery arising directly from the aorta. Anatomy is normal. No evidence of active bleeding. The Cobra catheter was then used to select the origin of the celiac artery. Arteriography was performed. The splenic artery is small in caliber. The hepatic artery demonstrates normal hepatic arterial anatomy. The gastroduodenal artery is slightly irregular with spasm in the proximal segment. No evidence of active extravasation. A glidewire was successfully advanced into the common hepatic artery and the C2 cobra catheter was advanced into the common hepatic artery. Additional arteriography was performed in multiple obliquities. Again, the proximal gastroduodenal artery is irregular and spasmodic. No evidence of dissection, aneurysm or active bleeding. A Lantern microcatheter was then advanced over a Fathom 16 wire and used to select the distal aspect of the gastroduodenal artery. Arteriography was performed. The gastroepiploic arteries are widely patent. No evidence of active bleeding. Coil embolization was then performed using a 4 mm Ruby POD detachable micro coil followed by a 45 cm Ruby packing coil. Post embolization arteriography demonstrates complete occlusion of the vessel with stasis in the proximal segment. The Lantern microcatheter was brought back into the common hepatic artery and arteriography was performed. Again, complete occlusion of the gastroduodenal artery. The right gastric artery is  visualized. No evidence of active bleeding from the right gastric artery. The microcatheter was removed. The C2 cobra catheter was used to select the superior mesenteric artery. Arteriography was performed. Patent pancreaticoduodenal arcade supplying the gastroepiploic artery. No evidence of active hemorrhage. The C2 cobra catheter was removed. A limited right common femoral arteriogram was performed confirming common femoral arterial access. Hemostasis was attained with the assistance of a Cordis ExoSeal extra arterial vascular plug. IMPRESSION: 1. No evidence of active hemorrhage. 2. 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 3. Prophylactic coil embolization of the gastroduodenal artery. Signed, Heath K. McCullough, MD, RPVI [facility]  Radiology Electronically Signed   By: Heath  McCullough M.D.   On: 09/07/2018 22:32   Ct Angio Abd/pel W/ And/or W/o  Result Date: 09/11/2018 CLINICAL DATA:  Status postoperative right popliteal and tibial embolectomy for embolic disease. Known bilateral iliac artery aneurysmal disease. EXAM: CT ANGIOGRAPHY ABDOMEN AND PELVIS WITH CONTRAST TECHNIQUE: Multidetector CT imaging of the abdomen and pelvis was performed using the standard protocol during   bolus administration of intravenous contrast. Multiplanar reconstructed images and MIPs were obtained and reviewed to evaluate the vascular anatomy. CONTRAST:  165m ISOVUE-370 IOPAMIDOL (ISOVUE-370) INJECTION 76% COMPARISON:  CTA of the abdomen on 01/30/2018 and CT of the abdomen and pelvis with contrast on 12/19/2017. FINDINGS: VASCULAR Aorta: Stable mild dilatation of the infrarenal aorta with maximal diameter of 3.1 cm. Celiac: Mild origin stenosis of 25-30%. Distal branches are patent. Status post transcatheter coil embolization of the gastroduodenal artery on 09/07/2018. Hepatic arteries are normally patent. SMA: Normally patent. Renals: Bilateral single  renal arteries demonstrate normal patency. IMA: Normally patent. Inflow: Fusiform aneurysmal disease present of bilateral common iliac arteries as previously demonstrated. Maximal caliber of the right common iliac artery is 2.8-2.9 cm and maximal caliber of the right common iliac artery is 2.7 cm. These measurements are stable since prior studies. The significant change since the prior studies is a much more prominent and new component of mural thrombus in both common iliac arteries, right greater than left. In the proximal right common iliac artery, mural thrombus narrows the aneurysmal lumen by at least 50% in its narrowest portion. Eccentric mural thrombus is present at the origin of the right common iliac artery and also distally just prior to the common iliac bifurcation without significant luminal stenosis. Aneurysmal disease again noted of bilateral internal iliac arteries with the left measuring approximately 2.5 cm in greatest diameter and the right measuring 1.5 cm. Both internal iliac arteries also demonstrate significant mural thrombus without occlusion. Mural thrombus is stable to slightly more prominent compared to the 12/19/2017 study. Bilateral external iliac arteries are normally patent and demonstrate diffuse arteriomegaly with maximum caliber 1.5 cm. Proximal Outflow: Bilateral common femoral arteries demonstrate dilatation with the left measuring up to 2.1 cm in diameter. Mural thrombus is present along the posterior lumen with approximately 50% reduction in luminal diameter. No thrombus is seen extending into proximal SFA or profunda femoral arteries. On the right side the common femoral artery measures up to 1.8 cm in the femoral bifurcation appears widely patent. Veins: Delayed venous phase imaging shows patent venous structures without evidence of thrombus. There is some probable mass effect of ectatic external iliac arteries on the distal aspects of bilateral external iliac veins without  evidence of associated deep venous thrombosis or significant collateral vein formation. Review of the MIP images confirms the above findings. NON-VASCULAR Lower chest: Small bilateral pleural effusions with bibasilar atelectasis. Hepatobiliary: No focal liver abnormality is seen. No gallstones, gallbladder wall thickening, or biliary dilatation. Pancreas: Since the March study, there is a change in appearance of the pancreas on the venous phase of imaging. The tail of the pancreas now appears progressively atrophic with associated pancreatic ductal dilatation up to approximately 7 mm. At the junction of the body and head of the pancreas, there is suggestion a potential ill-defined low-attenuation mass that could measure as much as 3 cm in AP diameter. Borders are very difficult to delineate by CT. There is no associated biliary obstruction. 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 Recommend eventual correlation with MRI of the abdomen with and without gadolinium. There may be a window of time recommended to wait to perform MRI after placement of the gastroduodenal artery Ruby coils. This can be further investigated based on manufacturer recommendation. If there may be a significant delay in ability to perform MRI, consider endoscopic ultrasound to investigate the pancreas. Spleen: Normal in size without focal abnormality. Adrenals/Urinary Tract: Adrenal glands are unremarkable. Kidneys are normal, without renal  calculi, focal lesion, or hydronephrosis. Bladder is unremarkable. Stomach/Bowel: Gastric fold thickening and prominent fold thickening of the duodenum may be consistent with known peptic ulcer disease. No evidence of bowel obstruction or free intraperitoneal air. No focal abscess identified. Lymphatic: No enlarged lymph nodes identified. Reproductive: Prostate is unremarkable. Other: Diffuse anasarca of the body wall and edema of the mesenteric and retroperitoneal fat likely relate to low albumin and  malnutrition. Musculoskeletal: Moderate degenerative disc disease at L5-S1. IMPRESSION: VASCULAR 1. 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 No complications evident after embolization. 2. Stable mild aneurysmal disease of the distal abdominal aorta measuring 3.1 cm. 3. 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 The major change since prior imaging is significant increase in mural thrombus within the dilated common iliac arteries bilaterally, especially on the right. This is most likely the source distal emboli to the right distal leg. Mural thrombus in the internal iliac artery aneurysms is stable to slightly more prominent. 4. Stable diffuse enlargement bilateral external iliac and common femoral arteries. The left common femoral artery does contain significant posterior wall mural thrombus. NON-VASCULAR 1. The most significant nonvascular finding is potential development of a mass of the pancreas at the juncture of the head and body causing progressive atrophy of the tail of the pancreas with associated pancreatic ductal dilatation. On the venous phase of imaging, delineation of the potential mass is quite vague but the region of mass may measure as much as 3 cm. There may be some issue with immediate performance of MRI in the setting recent coiling of the gastroduodenal artery even if the coils are MRI compatible. This can be checked with the manufacturer recommendation. If MRI can be performed immediately, an MRI of the abdomen with and without gadolinium would be helpful for further evaluation. If there is going to be significant delay, consider EUS characterization of the pancreas. 2. Diffuse body wall anasarca and edema of the mesenteric fat and retroperitoneum likely reflects low albumin and malnutrition. 3. Prominent gastric folds and duodenal folds without evidence of bowel obstruction or perforation. Electronically Signed   By:  Aletta Edouard M.D.   On: 09/11/2018 12:36     LOS: 13 days   Signature  Lala Lund M.D on 09/16/2018 at 9:04 AM   -  To page go to [email or web address] - password [facility]

## 2018-09-16 NOTE — Progress Notes (Signed)
    Subjective  - Heparin stopped due to drop in Hgb 6.5.   Physical Exam:      RIGHT    LEFT    PRESSURE WAVEFORM  PRESSURE WAVEFORM  BRACHIAL 132 Triphasic BRACHIAL 131 Triphasic  DP 152 Monophasic DP 162 Triphasic  PT 179 Triphasic PT 176 Triphasic    RIGHT LEFT  ABI 1.36 1.33    Right DP monophasic PT biphasic Right fasciotomies closed with staples   Assessment/Plan:    Vascular issues remained stable - right foot well perfused. Plans for EUS today Appears have failed anticoagulation given significant hgb drop with slow titration up on heparin gtt   Marty Heck, MD [facility] [place] Office: [phone] Pager: [place]

## 2018-09-16 NOTE — Interval H&P Note (Signed)
History and Physical Interval Note:  09/16/2018 1:33 PM  Ryan Medina  has presented today for surgery, with the diagnosis of Pancreatic mass  The various methods of treatment have been discussed with the patient and family. After consideration of risks, benefits and other options for treatment, the patient has consented to  Procedure(s) with comments: ESOPHAGEAL ENDOSCOPIC ULTRASOUND (EUS) RADIAL (N/A) TRANSESOPHAGEAL ECHOCARDIOGRAM (TEE) (N/A) - if cleared by GI as a surgical intervention .  The patient's history has been reviewed, patient examined, no change in status, stable for surgery.  I have reviewed the patient's chart and labs.  Questions were answered to the patient's satisfaction.     Jenkins Rouge

## 2018-09-16 NOTE — Progress Notes (Signed)
IP rehab admissions - I met briefly with patient and his family at the bedside.  Patient limited by severe pain while trying to work with therapy this am.  Noted patient done for procedure today.  Will check back in am.  Call me for questions.  [phone]

## 2018-09-16 NOTE — Progress Notes (Signed)
Physical Therapy Treatment Patient Details Name: Ryan Medina MRN: [id number] DOB: 12-10-1960 Today's Date: 09/16/2018    History of Present Illness Patient is a 57 y.o. male with PMH including but not limited to DM and HTN who presented to the hospital on 12/13 with a right ischemic leg secondary to acute popliteal and tibial embolus, underwent embolectomy on 12/13 and subsequently was placed on a heparin drip.  He unfortunately developed a acute right lower leg hematoma with compartment syndrome requiring fasciotomy on 12/15.  Further hospital course was complicated by development of hemorrhagic shock with acute blood loss anemia secondary to a bleeding duodenal ulcer-requiring ICU transfer-intubation for airway protection and IR evaluation with mesenteric angiogram and GDA embolization.  Upon stability he was transferred back to the triad hospitalist service on 12/19. Pt is now s/p closure of R LE fasciotomy on 09/10/18.    PT Comments    Pt remains very limited secondary to pain and unable to tolerate any WB'ing on R LE at this time. He continues to require two person physical assistance for transfers and attempted ambulation; however, only able to pivot/hop on L LE with RW and mod A x2. Pt would continue to benefit from skilled physical therapy services at this time while admitted and after d/c to address the below listed limitations in order to improve overall safety and independence with functional mobility.    Follow Up Recommendations  CIR     Equipment Recommendations  Rolling walker with 5" wheels;3in1 (PT)    Recommendations for Other Services       Precautions / Restrictions Precautions Precautions: Fall Restrictions Weight Bearing Restrictions: Yes RLE Weight Bearing: Weight bearing as tolerated    Mobility  Bed Mobility Overal bed mobility: Needs Assistance Bed Mobility: Supine to Sit;Sit to Supine     Supine to sit: Min guard Sit to supine: Min assist    General bed mobility comments: increased time and effort, min A to return R LE onto bed  Transfers Overall transfer level: Needs assistance Equipment used: Rolling walker (2 wheeled) Transfers: Sit to/from Stand Sit to Stand: Mod assist;+2 physical assistance;+2 safety/equipment;From elevated surface         General transfer comment: increased time and effort, bed in elevated position, cueing for safe hand placement, assist to power into standing from EOB; pt performed x2; very limited secondary to pain  Ambulation/Gait             General Gait Details: pt able to pivot/hop on L LE towards his L side to [facility] with RW and mod A x2; pt very limited secondary to pain and unable to tolerate any WB'ing through R LE   Stairs             Wheelchair Mobility    Modified Rankin (Stroke Patients Only)       Balance Overall balance assessment: Needs assistance Sitting-balance support: Feet supported Sitting balance-Leahy Scale: Fair     Standing balance support: Bilateral upper extremity supported Standing balance-Leahy Scale: Poor Standing balance comment: reliant on bilateral UEs on RW                            Cognition Arousal/Alertness: Awake/alert Behavior During Therapy: Anxious Overall Cognitive Status: Within Functional Limits for tasks assessed  Exercises      General Comments        Pertinent Vitals/Pain Pain Assessment: Faces Faces Pain Scale: Hurts whole lot Pain Location: R LE Pain Descriptors / Indicators: Sore;Guarding;Grimacing;Shooting Pain Intervention(s): Monitored during session;Repositioned    Home Living                      Prior Function            PT Goals (current goals can now be found in the care plan section) Acute Rehab PT Goals PT Goal Formulation: With patient Time For Goal Achievement: 09/25/18 Potential to Achieve Goals: Good Progress  towards PT goals: Progressing toward goals    Frequency    Min 3X/week      PT Plan Current plan remains appropriate    Co-evaluation              AM-PAC PT "6 Clicks" Mobility   Outcome Measure  Help needed turning from your back to your side while in a flat bed without using bedrails?: None Help needed moving from lying on your back to sitting on the side of a flat bed without using bedrails?: A Little Help needed moving to and from a bed to a chair (including a wheelchair)?: A Lot Help needed standing up from a chair using your arms (e.g., wheelchair or bedside chair)?: A Lot Help needed to walk in hospital room?: Total Help needed climbing 3-5 steps with a railing? : Total 6 Click Score: 13    End of Session Equipment Utilized During Treatment: Gait belt Activity Tolerance: Patient limited by pain Patient left: in bed;with call bell/phone within reach;with bed alarm set;with family/visitor present Nurse Communication: Mobility status;Patient requests pain meds PT Visit Diagnosis: Other abnormalities of gait and mobility (R26.89);Pain Pain - Right/Left: Right Pain - part of body: Leg     Time: 1116-1130 PT Time Calculation (min) (ACUTE ONLY): 14 min  Charges:  $Therapeutic Activity: 8-22 mins                     Sherie Don, PT, DPT  Acute Rehabilitation Services Pager [phone] Office [place] 09/16/2018, 12:31 PM

## 2018-09-16 NOTE — Anesthesia Procedure Notes (Signed)
Procedure Name: MAC Date/Time: 09/16/2018 1:05 PM Performed by: Colin Benton, CRNA Pre-anesthesia Checklist: Patient identified, Suction available, Patient being monitored and Emergency Drugs available Patient Re-evaluated:Patient Re-evaluated prior to induction Oxygen Delivery Method: Nasal cannula Preoxygenation: Pre-oxygenation with 100% oxygen Induction Type: IV induction Placement Confirmation: positive ETCO2 Dental Injury: Teeth and Oropharynx as per pre-operative assessment

## 2018-09-16 NOTE — Plan of Care (Signed)
  Problem: Education: Goal: Knowledge of General Education information will improve Description Including pain rating scale, medication(s)/side effects and non-pharmacologic comfort measures Outcome: Progressing Note:  POC reviewed with pt.; informed that he's NPO for TEE today.

## 2018-09-16 NOTE — Anesthesia Preprocedure Evaluation (Signed)
Anesthesia Evaluation  Patient identified by MRN, date of birth, ID band Patient awake    Reviewed: Allergy & Precautions, NPO status , Patient's Chart, lab work & pertinent test results  History of Anesthesia Complications Negative for: history of anesthetic complications  Airway Mallampati: II  TM Distance: >3 FB Neck ROM: Full    Dental  (+) Dental Advisory Given, Teeth Intact   Pulmonary Current Smoker,    breath sounds clear to auscultation       Cardiovascular hypertension, Pt. on medications + Peripheral Vascular Disease and +CHF   Rhythm:Regular Rate:Normal   '19 TTE - Mild LVH. EF 45% to 50%. Grade 1 diastolic dysfunction. Trivial AI. Mild MR and TR.     Neuro/Psych negative neurological ROS  negative psych ROS   GI/Hepatic GERD  Controlled,(+)     substance abuse  cocaine use,   Endo/Other  diabetes, Insulin Dependent Hyponatremia Hypocalcemia  Renal/GU Renal InsufficiencyRenal disease     Musculoskeletal  Chronic back pain    Abdominal   Peds  Hematology  (+) Blood dyscrasia, anemia ,   Anesthesia Other Findings   Reproductive/Obstetrics                             Anesthesia Physical  Anesthesia Plan  ASA: III  Anesthesia Plan: MAC   Post-op Pain Management:    Induction: Intravenous  PONV Risk Score and Plan: 2 and Treatment may vary due to age or medical condition, Ondansetron and Midazolam  Airway Management Planned: Nasal Cannula, Natural Airway and Mask  Additional Equipment: None  Intra-op Plan:   Post-operative Plan: Extubation in OR  Informed Consent: I have reviewed the patients History and Physical, chart, labs and discussed the procedure including the risks, benefits and alternatives for the proposed anesthesia with the patient or authorized representative who has indicated his/her understanding and acceptance.   Dental advisory given  Plan  Discussed with: CRNA, Anesthesiologist and Surgeon  Anesthesia Plan Comments:         Anesthesia Quick Evaluation

## 2018-09-16 NOTE — Progress Notes (Signed)
  Echocardiogram Echocardiogram Transesophageal has been performed.  Jannett Celestine 09/16/2018, 4:03 PM

## 2018-09-16 NOTE — Anesthesia Postprocedure Evaluation (Signed)
Anesthesia Post Note  Patient: Ryan Medina  Procedure(s) Performed: ESOPHAGEAL ENDOSCOPIC ULTRASOUND (EUS) RADIAL (N/A ) TRANSESOPHAGEAL ECHOCARDIOGRAM (TEE) (N/A ) BIOPSY ESOPHAGOGASTRODUODENOSCOPY (EGD) WITH PROPOFOL (N/A ) FINE NEEDLE ASPIRATION (FNA) LINEAR BUBBLE STUDY     Patient location during evaluation: Endoscopy Anesthesia Type: MAC Level of consciousness: awake and alert, patient cooperative and oriented Pain management: pain level controlled Vital Signs Assessment: post-procedure vital signs reviewed and stable Respiratory status: spontaneous breathing, nonlabored ventilation, respiratory function stable and patient connected to nasal cannula oxygen Cardiovascular status: blood pressure returned to baseline and stable Postop Assessment: no apparent nausea or vomiting Anesthetic complications: no    Last Vitals:  Vitals:   09/16/18 1608 09/16/18 1619  BP: 104/69 104/64  Pulse: 90 88  Resp: 20 (!) 22  Temp:    SpO2: 100% 100%    Last Pain:  Vitals:   09/16/18 1619  TempSrc:   PainSc: 0-No pain                 Zilah Villaflor,E. Kyden Potash

## 2018-09-16 NOTE — Transfer of Care (Signed)
Immediate Anesthesia Transfer of Care Note  Patient: Ryan Medina  Procedure(s) Performed: ESOPHAGEAL ENDOSCOPIC ULTRASOUND (EUS) RADIAL (N/A ) TRANSESOPHAGEAL ECHOCARDIOGRAM (TEE) (N/A ) BIOPSY ESOPHAGOGASTRODUODENOSCOPY (EGD) WITH PROPOFOL (N/A ) FINE NEEDLE ASPIRATION (FNA) LINEAR BUBBLE STUDY  Patient Location: PACU  Anesthesia Type:General  Level of Consciousness: drowsy and patient cooperative  Airway & Oxygen Therapy: Patient Spontanous Breathing and Patient connected to nasal cannula oxygen  Post-op Assessment: Report given to RN and Post -op Vital signs reviewed and stable  Post vital signs: Reviewed and stable  Last Vitals:  Vitals Value Taken Time  BP    Temp    Pulse    Resp    SpO2      Last Pain:  Vitals:   09/16/18 1345  TempSrc:   PainSc: 1       Patients Stated Pain Goal: 0 (06/00/45 9977)  Complications: No apparent anesthesia complications

## 2018-09-16 NOTE — Interval H&P Note (Signed)
History and Physical Interval Note:  09/16/2018 2:00 PM  Ryan Medina  has presented today for surgery, with the diagnosis of Pancreatic mass  The various methods of treatment have been discussed with the patient and family. After consideration of risks, benefits and other options for treatment, the patient has consented to  Procedure(s) with comments: ESOPHAGEAL ENDOSCOPIC ULTRASOUND (EUS) RADIAL (N/A) TRANSESOPHAGEAL ECHOCARDIOGRAM (TEE) (N/A) - if cleared by GI as a surgical intervention .  The patient's history has been reviewed, patient examined, no change in status, stable for surgery.  I have reviewed the patient's chart and labs.  Questions were answered to the patient's satisfaction.     Milus Banister

## 2018-09-16 NOTE — CV Procedure (Signed)
TEE Anesthesia: Propofol Study done after patient had EUS for pancreatic mass  EF 50-55% mil LVE Mild MR Normal AV No LAA thrombus Negative bubble study No SOE Normal RV  Mild TR No aortic debris  Jenkins Rouge

## 2018-09-16 NOTE — Progress Notes (Signed)
Pt. c/o's of increase pain in (R) leg and wanted kerlix dressing off, and dressing removed; pt. wants stronger pain med; Kirby,NP paged and order given.

## 2018-09-16 NOTE — Anesthesia Procedure Notes (Signed)
Procedure Name: Intubation Date/Time: 09/16/2018 2:28 PM Performed by: Colin Benton, CRNA Pre-anesthesia Checklist: Patient identified, Emergency Drugs available, Suction available and Patient being monitored Patient Re-evaluated:Patient Re-evaluated prior to induction Oxygen Delivery Method: Circle system utilized Preoxygenation: Pre-oxygenation with 100% oxygen Induction Type: IV induction Ventilation: Mask ventilation without difficulty Laryngoscope Size: Miller and 2 Grade View: Grade I Tube type: Oral Tube size: 7.5 mm Number of attempts: 1 Airway Equipment and Method: Stylet Placement Confirmation: ETT inserted through vocal cords under direct vision,  positive ETCO2 and breath sounds checked- equal and bilateral Secured at: 24 cm Tube secured with: Tape Dental Injury: Teeth and Oropharynx as per pre-operative assessment

## 2018-09-17 ENCOUNTER — Encounter (HOSPITAL_COMMUNITY): Payer: Self-pay | Admitting: Gastroenterology

## 2018-09-17 DIAGNOSIS — K8689 Other specified diseases of pancreas: Secondary | ICD-10-CM

## 2018-09-17 LAB — BPAM RBC
BLOOD PRODUCT EXPIRATION DATE: 202001222359
Blood Product Expiration Date: 202001012359
Blood Product Expiration Date: 202001222359
ISSUE DATE / TIME: 201912251419
ISSUE DATE / TIME: 201912252233
ISSUE DATE / TIME: 201912260125
Unit Type and Rh: 7300
Unit Type and Rh: 7300
Unit Type and Rh: 7300

## 2018-09-17 LAB — COMPREHENSIVE METABOLIC PANEL
ALBUMIN: 1.5 g/dL — AB (ref 3.5–5.0)
ALT: 28 U/L (ref 0–44)
AST: 28 U/L (ref 15–41)
Alkaline Phosphatase: 51 U/L (ref 38–126)
Anion gap: 9 (ref 5–15)
BUN: 8 mg/dL (ref 6–20)
CO2: 25 mmol/L (ref 22–32)
Calcium: 7.5 mg/dL — ABNORMAL LOW (ref 8.9–10.3)
Chloride: 101 mmol/L (ref 98–111)
Creatinine, Ser: 0.62 mg/dL (ref 0.61–1.24)
GFR calc Af Amer: >60 mL/min (ref >60)
GFR calc non Af Amer: >60 mL/min (ref >60)
GLUCOSE: 68 mg/dL — AB (ref 70–99)
Potassium: 3.4 mmol/L — ABNORMAL LOW (ref 3.5–5.1)
Sodium: 135 mmol/L (ref 135–145)
Total Bilirubin: 1 mg/dL (ref 0.3–1.2)
Total Protein: 4.4 g/dL — ABNORMAL LOW (ref 6.5–8.1)

## 2018-09-17 LAB — CBC
HCT: 25.9 % — ABNORMAL LOW (ref 39.0–52.0)
HEMOGLOBIN: 8.8 g/dL — AB (ref 13.0–17.0)
MCH: 28.7 pg (ref 26.0–34.0)
MCHC: 34 g/dL (ref 30.0–36.0)
MCV: 84.4 fL (ref 80.0–100.0)
Platelets: 354 10*3/uL (ref 150–400)
RBC: 3.07 MIL/uL — ABNORMAL LOW (ref 4.22–5.81)
RDW: 15 % (ref 11.5–15.5)
WBC: 13.3 10*3/uL — ABNORMAL HIGH (ref 4.0–10.5)
nRBC: 0 % (ref 0.0–0.2)

## 2018-09-17 LAB — TYPE AND SCREEN
ABO/RH(D): B POS
Antibody Screen: NEGATIVE
Unit division: 0
Unit division: 0
Unit division: 0

## 2018-09-17 LAB — GLUCOSE, CAPILLARY
GLUCOSE-CAPILLARY: 58 mg/dL — AB (ref 70–99)
GLUCOSE-CAPILLARY: 84 mg/dL (ref 70–99)
Glucose-Capillary: 70 mg/dL (ref 70–99)
Glucose-Capillary: 121 mg/dL — ABNORMAL HIGH (ref 70–99)
Glucose-Capillary: 43 mg/dL — CL (ref 70–99)
Glucose-Capillary: 81 mg/dL (ref 70–99)
Glucose-Capillary: 95 mg/dL (ref 70–99)

## 2018-09-17 LAB — MAGNESIUM: Magnesium: 1.6 mg/dL — ABNORMAL LOW (ref 1.7–2.4)

## 2018-09-17 MED ORDER — DEXTROSE 50 % IV SOLN
INTRAVENOUS | Status: AC
  Administered 2018-09-17: 50 mL
  Filled 2018-09-17: qty 50

## 2018-09-17 MED ORDER — POTASSIUM CHLORIDE CRYS ER 20 MEQ PO TBCR
40.0000 meq | EXTENDED_RELEASE_TABLET | Freq: Once | ORAL | Status: AC
  Administered 2018-09-17: 40 meq via ORAL
  Filled 2018-09-17: qty 2

## 2018-09-17 MED ORDER — MAGNESIUM SULFATE 2 GM/50ML IV SOLN
2.0000 g | Freq: Once | INTRAVENOUS | Status: AC
  Administered 2018-09-17: 2 g via INTRAVENOUS
  Filled 2018-09-17: qty 50

## 2018-09-17 NOTE — Progress Notes (Signed)
    Subjective  - EUS yesterday with pancreatic mass eroding into duodenum.  Heparin stopped several days ago after Hgb dropped to 6.5 again.  No complaints with right leg this am.   Physical Exam:  Right DP monophasic Right PT biphasic Right fasciotomies closed with staples c/d/i   Assessment/Plan:    Right foot well perfused - R PT biphasic/R DP monophasic - stable exam. Appears to have failed anticoagulation given significant hgb drop with slow titration up on heparin gtt and now new findings of pancreatic mass eroding into duodenum. Discussed with patient likely too high risk for anticoagulation given new findings and persistent drop in Hgb - appreciate GI input.   Marty Heck, MD [facility] Office: [phone] Pager: [place]

## 2018-09-17 NOTE — Progress Notes (Signed)
Hypoglycemic Event  CBG: 70  Treatment:  Apple juice given; Blood sugar rechecked and it was 58. 50% Dexterose injection given.  Symptoms: Pt was asymptomatic.  Follow-up CBG: Time 1312 CBG Result:121  Possible Reasons for Event: Lantus 10 Units given at 0930  Comments: MD notified    [place]

## 2018-09-17 NOTE — Progress Notes (Signed)
Daily Rounding Note  09/17/2018, 11:54 AM  LOS: 14 days   SUBJECTIVE:   Chief complaint: Malignant duodenal ulcer, malignant pancreatic mass. Stools dark.  No abdominal pain.  He is hungry and remains on clear liquids.  OBJECTIVE:         Vital signs in last 24 hours:    Temp:  [98.1 F (36.7 C)-98.8 F (37.1 C)] 98.7 F (37.1 C) (12/27 1601) Pulse Rate:  [80-94] 86 (12/27 0632) Resp:  [15-22] 18 (12/27 0932) BP: (93-141)/(56-94) 120/81 (12/27 0632) SpO2:  [97 %-100 %] 100 % (12/27 3557) Weight:  [66.7 kg] 66.7 kg (12/26 1325) Last BM Date: 09/17/18 Filed Weights   09/03/18 1650 09/16/18 1325  Weight: 66.7 kg 66.7 kg   General: Thin, alert, comfortable.  Does not look acutely ill Heart: RRR. Chest: Clear bilaterally.  No labored breathing Abdomen: Soft.  Not tender or distended.  Active bowel sounds. Extremities: Stapled, intact, clean, dry incisions on the right lower leg. Neuro/Psych: Oriented x3.  Calm.  Fluid speech, follows commands.  Intake/Output from previous day: 12/26 0701 - 12/27 0700 In: 600 [I.V.:600] Out: 1850 [Urine:1850]  Intake/Output this shift: No intake/output data recorded.  Lab Results: Recent Labs    09/15/18 0427  09/15/18 2016 09/16/18 0550 09/17/18 0358  WBC 12.0*  --   --  10.7* 13.3*  HGB 7.3*   < > 6.5* 9.1* 8.8*  HCT 22.8*   < > 20.1* 27.4* 25.9*  PLT 330  --   --  290 354   < > = values in this interval not displayed.   BMET Recent Labs    09/15/18 0427 09/16/18 0550 09/17/18 0358  NA 135 134* 135  K 3.7 3.8 3.4*  CL 96* 102 101  CO2 _0 GLUCOSE 311* 138* 68*  BUN 29* 15 8  CREATININE 0.74 0.69 0.62  CALCIUM 7.4* 7.5* 7.5*   LFT Recent Labs    09/15/18 0427 09/16/18 0550 09/17/18 0358  PROT 4.6* 4.4* 4.4*  ALBUMIN 1.6* 1.6* 1.5*  AST 38 33 28  ALT _1 ALKPHOS 53 49 51  BILITOT 0.7 1.0 1.0   PT/INR No results for input(s): LABPROT,  INR in the last 72 hours. Hepatitis Panel No results for input(s): HEPBSAG, HCVAB, HEPAIGM, HEPBIGM in the last 72 hours.  Studies/Results: No results found.  Scheduled Meds: . atorvastatin  20 mg Oral Daily  . chlorhexidine gluconate (MEDLINE KIT)  15 mL Mouth Rinse BID  . insulin aspart  0-9 Units Subcutaneous TID WC  . insulin glargine  10 Units Subcutaneous Daily  . lidocaine  15 mL Oral Once  . mouth rinse  15 mL Mouth Rinse BID  . pantoprazole (PROTONIX) IV  40 mg Intravenous Q12H  . tamsulosin  0.4 mg Oral Daily   Continuous Infusions: . sodium chloride     PRN Meds:.sodium chloride, alum & mag hydroxide-simeth, famotidine, ipratropium-albuterol, LORazepam, morphine injection, [DISCONTINUED] ondansetron **OR** ondansetron (ZOFRAN) IV, oxyCODONE-acetaminophen, polyethylene glycol, senna-docusate, sodium chloride flush  ASSESMENT:   *     Pancreatic mass.    EUS with FNA 09/16/2018 3.6cm by 2.1cm mass in the neck of pancreas surrounding celiac trunk, obstructs main pancreatic duct, appears to have also invaded duodenal bulb created a large, malignant, friable, duodenal ulcer. Biopsied mucosa surrounding the duodenal ulcer.  FNA sampling of pancreatic mass.  Preliminary cytology of mass positive for malignancy.  *  Malignant DU.  GI bleed.  CGE.  Bloody stool. EGD 09/08/18: severe esophagitis, duodenal bulb ulcer and overlying clot treated with hemospray.  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 On BID Po PPI.  Serum H. pylori IgA positive, IgG positive, IgM negative.   At EUS 12/26 there was a large amount of old blood in the stomach and the duodenal bulb ulcer was deeply cratered, surrounding mucosa friable and intermittently oozing blood.  Firm mucosa concerning for malignancy.  This was biopsied and pathology report pending.  It is likely to continue to ooze blood especially in the setting of anticoagulation.  *     Anemia acute due to blood loss, suspect chronic  anemia is baseline.  Over the course of the hospitalization received 13 U PRBCs.  Hgb stable last 24 hours.    *    Status post popliteal and tibial artery embolectomy 4/13, subsequent fasciotomy with evacuation of hematoma 12/15.  Repeat right popliteal and tibial embolectomy with closure of medial and lateral fasciotomy 12/20.    PLAN   *   Await final path report on pancreatic mass and duodenal ulcer biopsy.    *   Vascular surgeon, Dr. Monica Martinez states today patient is too high risk for anticoagulation given new findings and persistent drop in Hgb.  *   Allow patient diabetic, solid diet.  Whether or not he eats is not can make a difference in his tendency to ooze blood from the duodenal ulcer and he is hungry.  *   Would seek oncology input, not sure there is much that can be done either surgically or chemotherapy wise but patient needs to have a discussion with an oncologist.    Azucena Freed  09/17/2018, 11:54 AM Phone [phone]

## 2018-09-17 NOTE — Progress Notes (Signed)
Physical Therapy Treatment Patient Details Name: Ryan Medina MRN: [id number] DOB: 1960-11-22 Today's Date: 09/17/2018    History of Present Illness Patient is a 57 y.o. male with PMH including but not limited to DM and HTN who presented to the hospital on 12/13 with a right ischemic leg secondary to acute popliteal and tibial embolus, underwent embolectomy on 12/13 and subsequently was placed on a heparin drip.  He unfortunately developed a acute right lower leg hematoma with compartment syndrome requiring fasciotomy on 12/15.  Further hospital course was complicated by development of hemorrhagic shock with acute blood loss anemia secondary to a bleeding duodenal ulcer-requiring ICU transfer-intubation for airway protection and IR evaluation with mesenteric angiogram and GDA embolization.  Upon stability he was transferred back to the triad hospitalist service on 12/19. Pt is now s/p closure of R LE fasciotomy on 09/10/18. 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    PT Comments    Pt remains very limited secondary to R LE pain. He continues to be unable tolerate any WB'ing on R LE or even allow R foot to rest on floor in a standing position. Pt and pt's spouse now wishing to pursue SNF options as opposed to CIR with new malignant pancreatic mass findings. Recommendations have been updated. Pt would continue to benefit from skilled physical therapy services at this time while admitted and after d/c to address the below listed limitations in order to improve overall safety and independence with functional mobility.   Follow Up Recommendations  SNF;Other (comment)(pt and fiance would like to pursue SNF vs CIR at this time)     Equipment Recommendations  Rolling walker with 5" wheels;3in1 (PT)    Recommendations for Other Services       Precautions / Restrictions Precautions Precautions: Fall Restrictions Weight Bearing Restrictions: Yes RLE Weight  Bearing: Weight bearing as tolerated    Mobility  Bed Mobility Overal bed mobility: Needs Assistance Bed Mobility: Supine to Sit;Sit to Supine     Supine to sit: Supervision Sit to supine: Supervision   General bed mobility comments: supervision for safety  Transfers Overall transfer level: Needs assistance Equipment used: Rolling walker (2 wheeled) Transfers: Sit to/from Stand Sit to Stand: From elevated surface;Min guard         General transfer comment: cueing for safe hand placement, bed elevated, min guard for safety; pt performed x2 from EOB  Ambulation/Gait             General Gait Details: pt able to pivot/hop on L LE towards his L side to [facility] with RW and min A x2 for safety; pt very limited secondary to pain and unable to tolerate any WB'ing through R LE   Stairs             Wheelchair Mobility    Modified Rankin (Stroke Patients Only)       Balance Overall balance assessment: Needs assistance Sitting-balance support: Feet supported Sitting balance-Leahy Scale: Fair     Standing balance support: Bilateral upper extremity supported Standing balance-Leahy Scale: Poor Standing balance comment: reliant on bilateral UEs on RW                            Cognition Arousal/Alertness: Awake/alert Behavior During Therapy: WFL for tasks assessed/performed Overall Cognitive Status: Within Functional Limits for tasks assessed  Exercises General Exercises - Lower Extremity Ankle Circles/Pumps: AROM;Both;10 reps;Seated Long Arc Quad: AAROM;AROM;Strengthening;Right;15 reps;Seated    General Comments        Pertinent Vitals/Pain Pain Assessment: Faces Faces Pain Scale: Hurts whole lot Pain Location: R LE Pain Descriptors / Indicators: Sore;Guarding;Grimacing Pain Intervention(s): Monitored during session;Repositioned    Home Living                      Prior Function             PT Goals (current goals can now be found in the care plan section) Acute Rehab PT Goals PT Goal Formulation: With patient Time For Goal Achievement: 09/25/18 Potential to Achieve Goals: Fair Progress towards PT goals: Progressing toward goals    Frequency    Min 3X/week      PT Plan Discharge plan needs to be updated    Co-evaluation              AM-PAC PT "6 Clicks" Mobility   Outcome Measure  Help needed turning from your back to your side while in a flat bed without using bedrails?: None Help needed moving from lying on your back to sitting on the side of a flat bed without using bedrails?: None Help needed moving to and from a bed to a chair (including a wheelchair)?: A Little Help needed standing up from a chair using your arms (e.g., wheelchair or bedside chair)?: A Little Help needed to walk in hospital room?: Total Help needed climbing 3-5 steps with a railing? : Total 6 Click Score: 16    End of Session Equipment Utilized During Treatment: Gait belt Activity Tolerance: Patient limited by pain Patient left: in bed;with call bell/phone within reach;with family/visitor present Nurse Communication: Mobility status PT Visit Diagnosis: Other abnormalities of gait and mobility (R26.89);Pain Pain - Right/Left: Right Pain - part of body: Leg     Time: 8882-8003 PT Time Calculation (min) (ACUTE ONLY): 25 min  Charges:  $Therapeutic Activity: 23-37 mins                     Sherie Don, PT, DPT  Acute Rehabilitation Services Pager [phone] Office [place] 09/17/2018, 3:13 PM

## 2018-09-17 NOTE — Progress Notes (Signed)
Inpatient Diabetes Program Recommendations  AACE/ADA: New Consensus Statement on Inpatient Glycemic Control (2015)  Target Ranges:  Prepandial:   less than 140 mg/dL      Peak postprandial:   less than 180 mg/dL (1-2 hours)      Critically ill patients:  140 - 180 mg/dL   Lab Results  Component Value Date   GLUCAP 121 (H) 09/17/2018   HGBA1C 18.2 (H) 08/23/2018    Review of Glycemic Control Results for Ryan Medina, Ryan Medina (MRN [id number]) as of 09/17/2018 14:45  Ref. Range 09/16/2018 07:59 09/16/2018 12:36 09/16/2018 17:31 09/16/2018 21:29 09/17/2018 07:45 09/17/2018 11:46 09/17/2018 12:32 09/17/2018 13:11  Glucose-Capillary Latest Ref Range: 70 - 99 mg/dL 123 (H) 111 (H) 95 80 70 58 (L) 43 (LL) 121 (H)   Diabetes history: DM 2 Outpatient Diabetes medications:  Levemir 30 units q AM and 20 units q PM, Novolog 0-15 units tid with meals, Metformin 1000 mg bid Current orders for Inpatient glycemic control:  Novolog sensitive tid with meals, Lantus 10 units daily Inpatient Diabetes Program Recommendations:  Note low blood sugar today.  Please consider reducing Lantus to 5 units daily.    Thanks,  Adah Perl, RN, BC-ADM Inpatient Diabetes Coordinator Pager [phone] (8a-5p)

## 2018-09-17 NOTE — Progress Notes (Signed)
PROGRESS NOTE        PATIENT DETAILS Name: Ryan Medina Age: 57 y.o. Sex: male Date of Birth: December 23, 1960 Admit Date: 09/03/2018 Admitting Physician [name], MD [id number], No Pcp Per  Brief Narrative: Patient is a 57 y.o. male with history of cocaine use, chronic systolic heart failure presented to the hospital on 12/13 with a right ischemic leg secondary to acute popliteal and tibial embolus, underwent embolectomy on 12/13 and subsequently was placed on a heparin drip.  He unfortunately developed a acute right lower leg hematoma with compartment syndrome requiring fasciotomy on 12/15.  Further hospital course was complicated by development of hemorrhagic shock with acute blood loss anemia secondary to a bleeding duodenal ulcer-requiring ICU transfer-intubation for airway protection and IR evaluation with mesenteric angiogram and GDA embolization.  Upon stability he was transferred back to the triad hospitalist service on 12/19.  See below for further details.  Subjective:  Patient in bed, appears comfortable, denies any headache, no fever, no chest pain or pressure, no shortness of breath , no abdominal pain. No focal weakness.   Assessment/Plan:  Right ischemic leg secondary to popliteal artery/tibial artery embolism-s/p embolectomy on 32/02 complicated by right lower extremity hematoma with compartment syndrome requiring fasciotomy on 12/15:   Source of embolization unclear, required fasciotomy on 09/05/2018 by vascular surgery status post closure of fasciotomy on 09/10/2018.  His course was complicated by large hematoma in the right leg along with significant life-threatening GI bleed. Case discussed with vascular surgeon Dr. Monica Martinez who wants to continue anticoagulation as much as possible.   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  Kindly see anticoagulation below.  Cardiology for TEE has been consulted  as well.  Await report.  Upper GI bleeding with hemorrhagic shock and acute blood loss anemia: Developed hemorrhagic shock, was seen by GI, required multiple PRBC transfusions last transfusion on 09/09/2018.  Was seen by GI and eventually underwent gastroduodenal artery embolization on 09/07/2018.  Total 8 units of packed RBC, 1 unit of FFP and 1 unit of platelets transfused around 09/08/2018. GI note noted from 09/10/2018 (okay with heparin challenge).   Cautiously started on heparin drip on 09/12/2018 with stable H&H unfortunately on 09/14/2018 he had few episodes of hematemesis again with some drop in H&H after was stopped, he was replaced on IV PPI along with clear liquid diet, he has received 3 units of packed RBC between 09/14/2018 and 09/15/2018, currently H&H stable looks like bleeding might have stopped, case was discussed with on-call GI physician Dr. Collene Mares on 09/16/2015 and Dr. Tarri Glenn on 09/17/2015, already due for EUS on 09/16/2018.   Hypokalemia and hypomagnesemia.  Both replaced.    Urinary retention.  On Foley and Flomax.  Foley removed on 09/15/2018.     Acute hypoxic respiratory failure: Intubated when he developed hemorrhagic shock secondary to GI bleeding, successfully extubated-currently stable on 2 L of oxygen via nasal cannula.  Chronic systolic heart failure (EF 45-50% by TTE on 09/04/2018): Reasonably well compensated-follow weights, volume status, electrolytes closely.  Cocaine use/tobacco use: Counseled-I am still not sure if he has any intention of quitting.  3 cm AAA: Stable for follow-up in the outpatient setting  New pancreatic mass noted on CT scan on 09/11/2018.  MRI noted & confirms mass, CA 19.9 has been elevated, GI seeing and underwent EUS  with biopsy on 09/16/2018, suspicious lesion noted, biopsy result awaited.  Insulin-dependent DM-2: Last A1c on 12/2 was 18.2.  Increase Lantus for better control continue sliding scale.  CBG (last 3)  Recent Labs     09/16/18 1731 09/16/18 2129 09/17/18 0745  GLUCAP 95 80 70      DVT Prophylaxis: Heparin drip had to be stopped on 09/15/2018 due to GI bleed, will place SCDs in the left leg.  Code Status: Full code   Family Communication: Wife at bedside 12/25 & 12/26  Disposition Plan: Tele  Antimicrobial agents: Anti-infectives (From admission, onward)   Start     Dose/Rate Route Frequency Ordered Stop   09/10/18 2130  ceFAZolin (ANCEF) IVPB 2g/100 mL premix     2 g 200 mL/hr over 30 Minutes Intravenous Every 8 hours 09/10/18 1634 09/11/18 0700   09/10/18 0800  ceFAZolin (ANCEF) IVPB 1 g/50 mL premix    Note to Pharmacy:  Send with pt to OR   1 g 100 mL/hr over 30 Minutes Intravenous On call 09/09/18 0756 09/10/18 1407   09/07/18 1300  erythromycin 250 mg in sodium chloride 0.9 % 100 mL IVPB     250 mg 100 mL/hr over 60 Minutes Intravenous Once 09/07/18 1125 09/07/18 2351   09/06/18 0000  ceFAZolin (ANCEF) IVPB 1 g/50 mL premix    Note to Pharmacy:  Send with pt to OR   1 g 100 mL/hr over 30 Minutes Intravenous On call 09/05/18 0849 09/05/18 0952   09/03/18 2245  ceFAZolin (ANCEF) IVPB 2g/100 mL premix     2 g 200 mL/hr over 30 Minutes Intravenous Every 8 hours 09/03/18 2236 09/04/18 1444   09/03/18 1615  ceFAZolin (ANCEF) IVPB 2g/100 mL premix  Status:  Discontinued     2 g 200 mL/hr over 30 Minutes Intravenous On call to O.R. 09/03/18 1600 09/03/18 2035      Procedures:  12/17>>Mesenteric angiogram and coil embolization of the GDA  12/17>>EGD  12/15>> 1.  Evacuation hematoma right leg 2.  Evacuation of lymphocele right leg 3.  4 compartment fasciotomy 4.  Placement of VAC (medial and lateral)  12/13>> 1.  Ultrasound-guided access to the left common femoral artery 2.  Aortogram with bilateral iliac arteriogram 3.  Selective catheterization of the right external iliac artery with right lower extremity runoff  MRI Abd.  Pancreatic mass.   TEE -    EUS  Impression:  - 3.6cm by 2.1cm mass in the neck of pancreas that surrounds the celiac trunk, obstructs the main pancreatic duct and appears to have also invaded the duodenal bulb created a large, malignant, friable ulcer. I biopsied the abnormal, firm mucosa surrounding the duodenal ulcer and sampled the pancreatic mass with transgastric EUS FNA. Preliminary cytology review from the pancreatic mass was positive for malignancy.   Recommendation   - Return patient to hospital [facility] for ongoing care. - He has recieved 13 units of blood this admission, 3 of them in the past 24 hours. He will continue to ooze from the duodenal ulcer that I suspect is malignant, related to the nearby large pancreatic mass. Judicious use of bloodthinners is strongly recommended. - Await final cytology/pathology reports; both hopefully available tomorrow.        CONSULTS:  pulmonary/intensive care, vascular surgery and IR, GI, Cards  Time spent: 40 minutes-Greater than 50% of this time was spent in counseling, explanation of diagnosis, planning of further management, and coordination of care.  MEDICATIONS: Scheduled Meds: . atorvastatin  20 mg Oral Daily  . chlorhexidine gluconate (MEDLINE KIT)  15 mL Mouth Rinse BID  . insulin aspart  0-9 Units Subcutaneous TID WC  . insulin glargine  10 Units Subcutaneous Daily  . lidocaine  15 mL Oral Once  . mouth rinse  15 mL Mouth Rinse BID  . pantoprazole (PROTONIX) IV  40 mg Intravenous Q12H  . tamsulosin  0.4 mg Oral Daily   Continuous Infusions: . sodium chloride    . magnesium sulfate 1 - 4 g bolus IVPB 2 g (09/17/18 0931)   PRN Meds:.sodium chloride, alum & mag hydroxide-simeth, famotidine, ipratropium-albuterol, LORazepam, morphine injection, [DISCONTINUED] ondansetron **OR** ondansetron (ZOFRAN) IV, oxyCODONE-acetaminophen, polyethylene glycol, senna-docusate, sodium chloride flush   PHYSICAL EXAM: Vital signs: Vitals:   09/16/18 1619  09/16/18 2030 09/16/18 2128 09/17/18 0632  BP: 104/64 114/75 103/75 120/81  Pulse: 88  93 86  Resp: (!) 22  18 18   Temp:   98.8 F (37.1 C) 98.7 F (37.1 C)  TempSrc:   Oral Oral  SpO2: 100%  97% 100%  Weight:      Height:       Filed Weights   09/03/18 1650 09/16/18 1325  Weight: 66.7 kg 66.7 kg   Body mass index is 20.51 kg/m.   Exam  Awake Alert, Oriented X 3, No new F.N deficits, Normal affect .AT,PERRAL Supple Neck,No JVD, No cervical lymphadenopathy appriciated.  Symmetrical Chest wall movement, Good air movement bilaterally, CTAB RRR,No Gallops, Rubs or new Murmurs, No Parasternal Heave +ve B.Sounds, Abd Soft, No tenderness, No organomegaly appriciated, No rebound - guarding or rigidity. No Cyanosis, Clubbing or edema, No new Rash or bruise   I have personally reviewed following labs and imaging studies  LABORATORY DATA: CBC: Recent Labs  Lab 09/13/18 0352 09/14/18 0214 09/15/18 0427 09/15/18 1918 09/15/18 2016 09/16/18 0550 09/17/18 0358  WBC 7.9 7.9 12.0*  --   --  10.7* 13.3*  HGB 8.5* 8.6* 7.3* 6.6* 6.5* 9.1* 8.8*  HCT 24.9* 25.8* 22.8* 20.0* 20.1* 27.4* 25.9*  MCV 84.4 83.5 85.4  --   --  83.5 84.4  PLT 246 290 330  --   --  290 453    Basic Metabolic Panel: Recent Labs  Lab 09/12/18 0415 09/13/18 0352 09/14/18 0214 09/15/18 0427 09/16/18 0550 09/17/18 0358  NA 135 138 134* 135 134* 135  K 3.4* 3.4* 3.6 3.7 3.8 3.4*  CL 104 100 95* 96* 102 101  CO2 23 26 27 30 23 25   GLUCOSE 297* 254* 263* 311* 138* 68*  BUN 10 6 <5* 29* 15 8  CREATININE 0.75 0.49* 0.62 0.74 0.69 0.62  CALCIUM 7.1* 7.4* 7.6* 7.4* 7.5* 7.5*  MG 1.6* 1.7  --   --  1.7 1.6*    GFR: Estimated Creatinine Clearance: 96.1 mL/min (by C-G formula based on SCr of 0.62 mg/dL).  Liver Function Tests: Recent Labs  Lab 09/13/18 0352 09/14/18 0214 09/15/18 0427 09/16/18 0550 09/17/18 0358  AST 26 32 38 33 28  ALT 19 23 31 30 28   ALKPHOS 53 59 53 49 51  BILITOT 0.6 0.8  0.7 1.0 1.0  PROT 4.2* 4.8* 4.6* 4.4* 4.4*  ALBUMIN 1.4* 1.6* 1.6* 1.6* 1.5*   No results for input(s): LIPASE, AMYLASE in the last 168 hours. No results for input(s): AMMONIA in the last 168 hours.  Coagulation Profile: No results for input(s): INR, PROTIME in the last 168 hours.  Cardiac Enzymes: No results for input(s): CKTOTAL, CKMB, CKMBINDEX, TROPONINI  in the last 168 hours.  BNP (last 3 results) No results for input(s): PROBNP in the last 8760 hours.  HbA1C: No results for input(s): HGBA1C in the last 72 hours.  CBG: Recent Labs  Lab 09/16/18 0759 09/16/18 1236 09/16/18 1731 09/16/18 2129 09/17/18 0745  GLUCAP 123* 111* 95 80 70    Lipid Profile: No results for input(s): CHOL, HDL, LDLCALC, TRIG, CHOLHDL, LDLDIRECT in the last 72 hours.  Thyroid Function Tests: No results for input(s): TSH, T4TOTAL, FREET4, T3FREE, THYROIDAB in the last 72 hours.  Anemia Panel: No results for input(s): VITAMINB12, FOLATE, FERRITIN, TIBC, IRON, RETICCTPCT in the last 72 hours.  Urine analysis:    Component Value Date/Time   COLORURINE STRAW (A) 08/22/2018 2054   APPEARANCEUR CLEAR 08/22/2018 2054   LABSPEC 1.027 08/22/2018 2054   PHURINE 6.0 08/22/2018 2054   GLUCOSEU >=500 (A) 08/22/2018 2054   HGBUR NEGATIVE 08/22/2018 2054   [place] NEGATIVE 08/22/2018 2054   KETONESUR 80 (A) 08/22/2018 2054   PROTEINUR NEGATIVE 08/22/2018 2054   UROBILINOGEN 4.0 (H) 12/05/2016 0921   NITRITE NEGATIVE 08/22/2018 2054   LEUKOCYTESUR NEGATIVE 08/22/2018 2054    Sepsis Labs: Lactic Acid, Venous    Component Value Date/Time   LATICACIDVEN 1.7 09/07/2018 1600    MICROBIOLOGY: Recent Results (from the past 240 hour(s))  Surgical pcr screen     Status: None   Collection Time: 09/10/18  3:39 AM  Result Value Ref Range Status   MRSA, PCR NEGATIVE NEGATIVE Final   Staphylococcus aureus NEGATIVE NEGATIVE Final    Comment: (NOTE) The Xpert SA Assay (FDA approved for NASAL  specimens in patients 37 years of age and older), is one component of a comprehensive surveillance program. It is not intended to diagnose infection nor to guide or monitor treatment. Performed at [facility], [place] [address] [place],  [address]     RADIOLOGY STUDIES/RESULTS: Dg Abd 1 View  Result Date: 09/07/2018 CLINICAL DATA:  OG tube placement. EXAM: ABDOMEN - 1 VIEW COMPARISON:  CT abdomen pelvis 08/22/2018. FINDINGS: OG tube is in place with the side port and tip in the body of the stomach. The stomach is distended. IMPRESSION: OG tube in good position. Electronically Signed   By: Inge Rise M.D.   On: 09/07/2018 13:31   Ct Angio Chest Pe W Or Wo Contrast  Result Date: 09/06/2018 CLINICAL DATA:  Evaluate thoracic aortic aneurysm. EXAM: CT ANGIOGRAPHY CHEST WITH CONTRAST TECHNIQUE: Multidetector CT imaging of the chest was performed using the standard protocol during bolus administration of intravenous contrast. Multiplanar CT image reconstructions and MIPs were obtained to evaluate the vascular anatomy. CONTRAST:  65 mL ISOVUE-370 IOPAMIDOL (ISOVUE-370) INJECTION 76% COMPARISON:  01/30/2018; 10/14/2016; 04/25/2026 FINDINGS: Vascular Findings: No evidence of thoracic aortic aneurysm with measurements as follows. No definite evidence of thoracic aortic dissection or periaortic stranding on this nongated examination. Bovine configuration of the aortic arch. The branch vessels of the aortic arch appear widely patent throughout their imaged courses. The descending thoracic aorta is of normal caliber and widely patent without hemodynamically significant stenosis. There is a very minimal amount of atherosclerotic plaque involving the proximal aspect of the descending thoracic aorta. Cardiomegaly.  No pericardial effusion. Although this examination was not tailored for the evaluation the pulmonary arteries, there are no discrete filling defects within the central pulmonary  arterial tree to suggest central pulmonary embolism. Borderline enlarged caliber the main pulmonary artery measuring 32 mm in diameter (image 67, series 6) -------------------------------------------------------------  Thoracic aortic measurements: Sinotubular junction 33 mm as measured in greatest oblique coronal dimension. Proximal ascending aorta 35 mm as measured in greatest oblique axial dimension at the level of the main pulmonary artery (image 70, series 6); and approximately 36 mm in greatest oblique short axis coronal diameter (coronal image 48, series 9). Aortic arch aorta 28 mm as measured in greatest oblique sagittal dimension. Proximal descending thoracic aorta 30 mm as measured in greatest oblique axial dimension at the level of the main pulmonary artery. Distal descending thoracic aorta 29 mm as measured in greatest oblique axial dimension at the level of the diaphragmatic hiatus. Review of the MIP images confirms the above findings. ------------------------------------------------------------- Non-Vascular Findings: Mediastinum/Lymph Nodes: Prominent right hilar lymph nodes are not enlarged by size criteria with index right suprahilar lymph node measuring 0.8 cm in greatest short axis diameter (image 71, series 6) and index right infrahilar lymph node measuring 0.7 cm (image 88). No definitive bulky mediastinal, hilar or axillary lymphadenopathy. Lungs/Pleura: Interval development of small bilateral pleural effusions with worsening bibasilar consolidative opacities and associated air bronchograms, right greater than left. Interval development of an approximately 1.0 x 0.5 cm subpleural consolidative opacity within the right upper lobe (image 36, series 7). Unchanged punctate (approximately 3 mm) calcified granuloma within the left lower lobe (image 75, series 3). Evaluation for additional pulmonary nodules is degraded secondary to bibasilar airspace opacities. The central pulmonary airways appear  widely patent. Upper abdomen: Fluid distention of the mid and distal aspects of the esophagus with marked distension of the imaged superior aspect of the stomach. Musculoskeletal: No acute or aggressive osseous abnormalities. There is incomplete fusion involving the spinous processes of multiple thoracic vertebral bodies, unchanged. Regional soft tissues appear normal. Normal appearance of the thyroid gland. IMPRESSION: 1. No evidence of thoracic aortic aneurysm or dissection. 2. Small bilateral effusions with associated bibasilar consolidative opacities with associated air bronchograms, atelectasis versus infiltrate. Follow-up chest radiograph in 4-6 weeks after treatment is recommended to ensure resolution. 3. Fluid distension of the mid and distal aspects of the esophagus as well as marked distension of the imaged superior aspect stomach - correlation for gastric outlet obstructive symptoms is advised. 4. Cardiomegaly with enlargement of the caliber the main pulmonary artery as could be seen in the setting of pulmonary arterial hypertension. Electronically Signed   By: Sandi Mariscal M.D.   On: 09/06/2018 09:20   Mr Abdomen W Wo Contrast  Result Date: 09/12/2018 CLINICAL DATA:  Suspicion of pancreatic neck mass on CT. EXAM: MRI ABDOMEN WITHOUT AND WITH CONTRAST TECHNIQUE: Multiplanar multisequence MR imaging of the abdomen was performed both before and after the administration of intravenous contrast. CONTRAST:  6 cc Gadavist COMPARISON:  CT of 1 day prior. Stone study of 08/22/2018. More remote CT of 12/19/2017. FINDINGS: Moderatemultifactorial degradation, including respiratory motion and susceptibility artifact from embolization coils. Lower chest: Bilateral pleural fluid with bibasilar airspace disease. Normal heart size. Hepatobiliary: Left hepatic lobe 9 mm cyst. Too small to characterizea right hepatic lobe lesion is most likely a cyst at 2 mm. Normal gallbladder, without biliary ductal dilatation.  Pancreas: An area of soft tissue fullness and restricted diffusion is identified within the pancreatic neck, corresponding to the abnormality on CT. This measures on the order of 3.9 x 3.6 cm on image 76/12. Also 3.6 x 3.6 cm on image 35/31. Results in pancreatic duct dilatation within the upstream body and tail on image 17/11. Involves the celiac and its branches including on image 75/12. Spleen:  Grossly within normal limits. Adrenals/Urinary Tract: Normal adrenal glands. Grossly normal kidneys, without hydronephrosis. Stomach/Bowel: Gastric distension with proximal wall and fold thickening, including on image 73/12. Grossly normal abdominal bowel loops. Vascular/Lymphatic: Normal caliber of the aorta. 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 Limited evaluation for abdominal adenopathy. Other:  Small volume abdominal ascites. Musculoskeletal: No acute osseous abnormality. IMPRESSION: 1. Moderate motion and susceptibility artifact degradation throughout. 2. Confirmation of a pancreatic neck mass, most consistent with adenocarcinoma. Involvement of the celiac and its branches, most consistent with a non-operative lesion. 3. Consider endoscopic ultrasound sampling. 4. Gastric wall and fold thickening, suggesting gastritis. 5. Small bilateral pleural effusions with adjacent airspace disease. Electronically Signed   By: Abigail Miyamoto M.D.   On: 09/12/2018 03:04   Ir Angiogram Visceral Selective  Result Date: 09/07/2018 INDICATION: 57 year old male with large volume upper GI bleed secondary to a very large duodenal ulcer. Patient underwent endoscopic intervention but continues to require pressors and large volume transfusion. He presents for mesenteric arteriography and embolization of any active bleeding and prophylactic embolization of the gastroduodenal artery. EXAM: IR ULTRASOUND GUIDANCE VASC ACCESS RIGHT; ADDITIONAL ARTERIOGRAPHY; IR EMBO ART VEN HEMORR LYMPH EXTRAV INC GUIDE ROADMAPPING;  SELECTIVE VISCERAL ARTERIOGRAPHY 1. Ultrasound-guided vascular access 2. Catheterization of the left gastric artery where it arises directly from the aorta with arteriogram 3. Catheterization of the celiac artery with arteriogram 4. Catheterization of the common hepatic artery with arteriogram 5. Catheterization of the gastroduodenal artery with arteriogram 6. Coil embolization of the gastroduodenal artery 7. Catheterization of the superior mesenteric artery with arteriogram MEDICATIONS: None ANESTHESIA/SEDATION: Patient intubated and on propofol drip CONTRAST:  70 mL Isovue 370 FLUOROSCOPY TIME:  Fluoroscopy Time: 11 minutes 30 seconds (487 mGy). COMPLICATIONS: None immediate. PROCEDURE: Informed consent was obtained from the patient following explanation of the procedure, risks, benefits and alternatives. The patient understands, agrees and consents for the procedure. All questions were addressed. A time out was performed prior to the initiation of the procedure. Maximal barrier sterile technique utilized including caps, mask, sterile gowns, sterile gloves, large sterile drape, hand hygiene, and Betadine prep. The right common femoral artery was interrogated with ultrasound and found to be widely patent. An image was obtained and stored for the medical record. Local anesthesia was attained by infiltration with 1% lidocaine. A small dermatotomy was made. Under real-time sonographic guidance, the vessel was punctured with a 21 gauge micropuncture needle. Using standard technique, the initial micro needle was exchanged over a 0.018 micro wire for a transitional 4 [place] micro sheath. The micro sheath was then exchanged over a 0.035 wire for a 5 French vascular sheath. A C2 cobra catheter was advanced in the abdominal aorta over a Bentson wire. The catheter was used to select the first vessel arising from the ventral aorta. Contrast was injected. This is a left gastric artery arising directly from the aorta. Anatomy  is normal. No evidence of active bleeding. The Cobra catheter was then used to select the origin of the celiac artery. Arteriography was performed. The splenic artery is small in caliber. The hepatic artery demonstrates normal hepatic arterial anatomy. The gastroduodenal artery is slightly irregular with spasm in the proximal segment. No evidence of active extravasation. A glidewire was successfully advanced into the common hepatic artery and the C2 cobra catheter was advanced into the common hepatic artery. Additional arteriography was performed in multiple obliquities. Again, the proximal gastroduodenal artery is irregular and spasmodic. No evidence of dissection, aneurysm or active bleeding. A Lantern  microcatheter was then advanced over a Fathom 16 wire and used to select the distal aspect of the gastroduodenal artery. Arteriography was performed. The gastroepiploic arteries are widely patent. No evidence of active bleeding. Coil embolization was then performed using a 4 mm Ruby POD detachable micro coil followed by a 45 cm Ruby packing coil. Post embolization arteriography demonstrates complete occlusion of the vessel with stasis in the proximal segment. The Lantern microcatheter was brought back into the common hepatic artery and arteriography was performed. Again, complete occlusion of the gastroduodenal artery. The right gastric artery is visualized. No evidence of active bleeding from the right gastric artery. The microcatheter was removed. The C2 cobra catheter was used to select the superior mesenteric artery. Arteriography was performed. Patent pancreaticoduodenal arcade supplying the gastroepiploic artery. No evidence of active hemorrhage. The C2 cobra catheter was removed. A limited right common femoral arteriogram was performed confirming common femoral arterial access. Hemostasis was attained with the assistance of a Cordis ExoSeal extra arterial vascular plug. IMPRESSION: 1. No evidence of active  hemorrhage. 2. 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 3. Prophylactic coil embolization of the gastroduodenal artery. Signed, Criselda Peaches, MD, [facility] Electronically Signed   By: Jacqulynn Cadet M.D.   On: 09/07/2018 22:32   Ir Angiogram Visceral Selective  Result Date: 09/07/2018 INDICATION: 57 year old male with large volume upper GI bleed secondary to a very large duodenal ulcer. Patient underwent endoscopic intervention but continues to require pressors and large volume transfusion. He presents for mesenteric arteriography and embolization of any active bleeding and prophylactic embolization of the gastroduodenal artery. EXAM: IR ULTRASOUND GUIDANCE VASC ACCESS RIGHT; ADDITIONAL ARTERIOGRAPHY; IR EMBO ART VEN HEMORR LYMPH EXTRAV INC GUIDE ROADMAPPING; SELECTIVE VISCERAL ARTERIOGRAPHY 1. Ultrasound-guided vascular access 2. Catheterization of the left gastric artery where it arises directly from the aorta with arteriogram 3. Catheterization of the celiac artery with arteriogram 4. Catheterization of the common hepatic artery with arteriogram 5. Catheterization of the gastroduodenal artery with arteriogram 6. Coil embolization of the gastroduodenal artery 7. Catheterization of the superior mesenteric artery with arteriogram MEDICATIONS: None ANESTHESIA/SEDATION: Patient intubated and on propofol drip CONTRAST:  70 mL Isovue 370 FLUOROSCOPY TIME:  Fluoroscopy Time: 11 minutes 30 seconds (487 mGy). COMPLICATIONS: None immediate. PROCEDURE: Informed consent was obtained from the patient following explanation of the procedure, risks, benefits and alternatives. The patient understands, agrees and consents for the procedure. All questions were addressed. A time out was performed prior to the initiation of the procedure. Maximal barrier sterile technique utilized including caps, mask, sterile gowns, sterile  gloves, large sterile drape, hand hygiene, and Betadine prep. The right common femoral artery was interrogated with ultrasound and found to be widely patent. An image was obtained and stored for the medical record. Local anesthesia was attained by infiltration with 1% lidocaine. A small dermatotomy was made. Under real-time sonographic guidance, the vessel was punctured with a 21 gauge micropuncture needle. Using standard technique, the initial micro needle was exchanged over a 0.018 micro wire for a transitional 4 [place] micro sheath. The micro sheath was then exchanged over a 0.035 wire for a 5 French vascular sheath. A C2 cobra catheter was advanced in the abdominal aorta over a Bentson wire. The catheter was used to select the first vessel arising from the ventral aorta. Contrast was injected. This is a left gastric artery arising directly from the aorta. Anatomy is normal. No evidence of active bleeding. The Cobra catheter was  then used to select the origin of the celiac artery. Arteriography was performed. The splenic artery is small in caliber. The hepatic artery demonstrates normal hepatic arterial anatomy. The gastroduodenal artery is slightly irregular with spasm in the proximal segment. No evidence of active extravasation. A glidewire was successfully advanced into the common hepatic artery and the C2 cobra catheter was advanced into the common hepatic artery. Additional arteriography was performed in multiple obliquities. Again, the proximal gastroduodenal artery is irregular and spasmodic. No evidence of dissection, aneurysm or active bleeding. A Lantern microcatheter was then advanced over a Fathom 16 wire and used to select the distal aspect of the gastroduodenal artery. Arteriography was performed. The gastroepiploic arteries are widely patent. No evidence of active bleeding. Coil embolization was then performed using a 4 mm Ruby POD detachable micro coil followed by a 45 cm Ruby packing coil. Post  embolization arteriography demonstrates complete occlusion of the vessel with stasis in the proximal segment. The Lantern microcatheter was brought back into the common hepatic artery and arteriography was performed. Again, complete occlusion of the gastroduodenal artery. The right gastric artery is visualized. No evidence of active bleeding from the right gastric artery. The microcatheter was removed. The C2 cobra catheter was used to select the superior mesenteric artery. Arteriography was performed. Patent pancreaticoduodenal arcade supplying the gastroepiploic artery. No evidence of active hemorrhage. The C2 cobra catheter was removed. A limited right common femoral arteriogram was performed confirming common femoral arterial access. Hemostasis was attained with the assistance of a Cordis ExoSeal extra arterial vascular plug. IMPRESSION: 1. No evidence of active hemorrhage. 2. 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 3. Prophylactic coil embolization of the gastroduodenal artery. Signed, Criselda Peaches, MD, [place] [facility] Radiology Electronically Signed   By: Jacqulynn Cadet M.D.   On: 09/07/2018 22:32   Ir Angiogram Selective Each Additional Vessel  Result Date: 09/07/2018 INDICATION: 57 year old male with large volume upper GI bleed secondary to a very large duodenal ulcer. Patient underwent endoscopic intervention but continues to require pressors and large volume transfusion. He presents for mesenteric arteriography and embolization of any active bleeding and prophylactic embolization of the gastroduodenal artery. EXAM: IR ULTRASOUND GUIDANCE VASC ACCESS RIGHT; ADDITIONAL ARTERIOGRAPHY; IR EMBO ART VEN HEMORR LYMPH EXTRAV INC GUIDE ROADMAPPING; SELECTIVE VISCERAL ARTERIOGRAPHY 1. Ultrasound-guided vascular access 2. Catheterization of the left gastric artery where it arises directly from the aorta with arteriogram 3.  Catheterization of the celiac artery with arteriogram 4. Catheterization of the common hepatic artery with arteriogram 5. Catheterization of the gastroduodenal artery with arteriogram 6. Coil embolization of the gastroduodenal artery 7. Catheterization of the superior mesenteric artery with arteriogram MEDICATIONS: None ANESTHESIA/SEDATION: Patient intubated and on propofol drip CONTRAST:  70 mL Isovue 370 FLUOROSCOPY TIME:  Fluoroscopy Time: 11 minutes 30 seconds (487 mGy). COMPLICATIONS: None immediate. PROCEDURE: Informed consent was obtained from the patient following explanation of the procedure, risks, benefits and alternatives. The patient understands, agrees and consents for the procedure. All questions were addressed. A time out was performed prior to the initiation of the procedure. Maximal barrier sterile technique utilized including caps, mask, sterile gowns, sterile gloves, large sterile drape, hand hygiene, and Betadine prep. The right common femoral artery was interrogated with ultrasound and found to be widely patent. An image was obtained and stored for the medical record. Local anesthesia was attained by infiltration with 1% lidocaine. A small dermatotomy was made. Under real-time sonographic guidance, the vessel was punctured with a  21 gauge micropuncture needle. Using standard technique, the initial micro needle was exchanged over a 0.018 micro wire for a transitional 4 [place] micro sheath. The micro sheath was then exchanged over a 0.035 wire for a 5 French vascular sheath. A C2 cobra catheter was advanced in the abdominal aorta over a Bentson wire. The catheter was used to select the first vessel arising from the ventral aorta. Contrast was injected. This is a left gastric artery arising directly from the aorta. Anatomy is normal. No evidence of active bleeding. The Cobra catheter was then used to select the origin of the celiac artery. Arteriography was performed. The splenic artery is small  in caliber. The hepatic artery demonstrates normal hepatic arterial anatomy. The gastroduodenal artery is slightly irregular with spasm in the proximal segment. No evidence of active extravasation. A glidewire was successfully advanced into the common hepatic artery and the C2 cobra catheter was advanced into the common hepatic artery. Additional arteriography was performed in multiple obliquities. Again, the proximal gastroduodenal artery is irregular and spasmodic. No evidence of dissection, aneurysm or active bleeding. A Lantern microcatheter was then advanced over a Fathom 16 wire and used to select the distal aspect of the gastroduodenal artery. Arteriography was performed. The gastroepiploic arteries are widely patent. No evidence of active bleeding. Coil embolization was then performed using a 4 mm Ruby POD detachable micro coil followed by a 45 cm Ruby packing coil. Post embolization arteriography demonstrates complete occlusion of the vessel with stasis in the proximal segment. The Lantern microcatheter was brought back into the common hepatic artery and arteriography was performed. Again, complete occlusion of the gastroduodenal artery. The right gastric artery is visualized. No evidence of active bleeding from the right gastric artery. The microcatheter was removed. The C2 cobra catheter was used to select the superior mesenteric artery. Arteriography was performed. Patent pancreaticoduodenal arcade supplying the gastroepiploic artery. No evidence of active hemorrhage. The C2 cobra catheter was removed. A limited right common femoral arteriogram was performed confirming common femoral arterial access. Hemostasis was attained with the assistance of a Cordis ExoSeal extra arterial vascular plug. IMPRESSION: 1. No evidence of active hemorrhage. 2. 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 3. Prophylactic coil embolization of the gastroduodenal artery. Signed, Criselda Peaches, MD,  [place] [facility] Radiology Electronically Signed   By: Jacqulynn Cadet M.D.   On: 09/07/2018 22:32   Ir Angiogram Selective Each Additional Vessel  Result Date: 09/07/2018 INDICATION: 57 year old male with large volume upper GI bleed secondary to a very large duodenal ulcer. Patient underwent endoscopic intervention but continues to require pressors and large volume transfusion. He presents for mesenteric arteriography and embolization of any active bleeding and prophylactic embolization of the gastroduodenal artery. EXAM: IR ULTRASOUND GUIDANCE VASC ACCESS RIGHT; ADDITIONAL ARTERIOGRAPHY; IR EMBO ART VEN HEMORR LYMPH EXTRAV INC GUIDE ROADMAPPING; SELECTIVE VISCERAL ARTERIOGRAPHY 1. Ultrasound-guided vascular access 2. Catheterization of the left gastric artery where it arises directly from the aorta with arteriogram 3. Catheterization of the celiac artery with arteriogram 4. Catheterization of the common hepatic artery with arteriogram 5. Catheterization of the gastroduodenal artery with arteriogram 6. Coil embolization of the gastroduodenal artery 7. Catheterization of the superior mesenteric artery with arteriogram MEDICATIONS: None ANESTHESIA/SEDATION: Patient intubated and on propofol drip CONTRAST:  70 mL Isovue 370 FLUOROSCOPY TIME:  Fluoroscopy Time: 11 minutes 30 seconds (487 mGy). COMPLICATIONS: None immediate. PROCEDURE: Informed consent was obtained from the patient following explanation of the procedure, risks, benefits and  alternatives. The patient understands, agrees and consents for the procedure. All questions were addressed. A time out was performed prior to the initiation of the procedure. Maximal barrier sterile technique utilized including caps, mask, sterile gowns, sterile gloves, large sterile drape, hand hygiene, and Betadine prep. The right common femoral artery was interrogated with ultrasound and found to be widely patent. An image  was obtained and stored for the medical record. Local anesthesia was attained by infiltration with 1% lidocaine. A small dermatotomy was made. Under real-time sonographic guidance, the vessel was punctured with a 21 gauge micropuncture needle. Using standard technique, the initial micro needle was exchanged over a 0.018 micro wire for a transitional 4 [place] micro sheath. The micro sheath was then exchanged over a 0.035 wire for a 5 French vascular sheath. A C2 cobra catheter was advanced in the abdominal aorta over a Bentson wire. The catheter was used to select the first vessel arising from the ventral aorta. Contrast was injected. This is a left gastric artery arising directly from the aorta. Anatomy is normal. No evidence of active bleeding. The Cobra catheter was then used to select the origin of the celiac artery. Arteriography was performed. The splenic artery is small in caliber. The hepatic artery demonstrates normal hepatic arterial anatomy. The gastroduodenal artery is slightly irregular with spasm in the proximal segment. No evidence of active extravasation. A glidewire was successfully advanced into the common hepatic artery and the C2 cobra catheter was advanced into the common hepatic artery. Additional arteriography was performed in multiple obliquities. Again, the proximal gastroduodenal artery is irregular and spasmodic. No evidence of dissection, aneurysm or active bleeding. A Lantern microcatheter was then advanced over a Fathom 16 wire and used to select the distal aspect of the gastroduodenal artery. Arteriography was performed. The gastroepiploic arteries are widely patent. No evidence of active bleeding. Coil embolization was then performed using a 4 mm Ruby POD detachable micro coil followed by a 45 cm Ruby packing coil. Post embolization arteriography demonstrates complete occlusion of the vessel with stasis in the proximal segment. The Lantern microcatheter was brought back into the common  hepatic artery and arteriography was performed. Again, complete occlusion of the gastroduodenal artery. The right gastric artery is visualized. No evidence of active bleeding from the right gastric artery. The microcatheter was removed. The C2 cobra catheter was used to select the superior mesenteric artery. Arteriography was performed. Patent pancreaticoduodenal arcade supplying the gastroepiploic artery. No evidence of active hemorrhage. The C2 cobra catheter was removed. A limited right common femoral arteriogram was performed confirming common femoral arterial access. Hemostasis was attained with the assistance of a Cordis ExoSeal extra arterial vascular plug. IMPRESSION: 1. No evidence of active hemorrhage. 2. 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 3. Prophylactic coil embolization of the gastroduodenal artery. Signed, Criselda Peaches, MD, [place] [facility] Radiology Electronically Signed   By: Jacqulynn Cadet M.D.   On: 09/07/2018 22:32   Ir US Guide Vasc Access Right  Result Date: 09/07/2018 INDICATION: 57 year old male with large volume upper GI bleed secondary to a very large duodenal ulcer. Patient underwent endoscopic intervention but continues to require pressors and large volume transfusion. He presents for mesenteric arteriography and embolization of any active bleeding and prophylactic embolization of the gastroduodenal artery. EXAM: IR ULTRASOUND GUIDANCE VASC ACCESS RIGHT; ADDITIONAL ARTERIOGRAPHY; IR EMBO ART VEN HEMORR LYMPH EXTRAV INC GUIDE ROADMAPPING; SELECTIVE VISCERAL ARTERIOGRAPHY 1. Ultrasound-guided vascular access 2. Catheterization of the left gastric artery  where it arises directly from the aorta with arteriogram 3. Catheterization of the celiac artery with arteriogram 4. Catheterization of the common hepatic artery with arteriogram 5. Catheterization of the gastroduodenal artery with arteriogram 6.  Coil embolization of the gastroduodenal artery 7. Catheterization of the superior mesenteric artery with arteriogram MEDICATIONS: None ANESTHESIA/SEDATION: Patient intubated and on propofol drip CONTRAST:  70 mL Isovue 370 FLUOROSCOPY TIME:  Fluoroscopy Time: 11 minutes 30 seconds (487 mGy). COMPLICATIONS: None immediate. PROCEDURE: Informed consent was obtained from the patient following explanation of the procedure, risks, benefits and alternatives. The patient understands, agrees and consents for the procedure. All questions were addressed. A time out was performed prior to the initiation of the procedure. Maximal barrier sterile technique utilized including caps, mask, sterile gowns, sterile gloves, large sterile drape, hand hygiene, and Betadine prep. The right common femoral artery was interrogated with ultrasound and found to be widely patent. An image was obtained and stored for the medical record. Local anesthesia was attained by infiltration with 1% lidocaine. A small dermatotomy was made. Under real-time sonographic guidance, the vessel was punctured with a 21 gauge micropuncture needle. Using standard technique, the initial micro needle was exchanged over a 0.018 micro wire for a transitional 4 [place] micro sheath. The micro sheath was then exchanged over a 0.035 wire for a 5 French vascular sheath. A C2 cobra catheter was advanced in the abdominal aorta over a Bentson wire. The catheter was used to select the first vessel arising from the ventral aorta. Contrast was injected. This is a left gastric artery arising directly from the aorta. Anatomy is normal. No evidence of active bleeding. The Cobra catheter was then used to select the origin of the celiac artery. Arteriography was performed. The splenic artery is small in caliber. The hepatic artery demonstrates normal hepatic arterial anatomy. The gastroduodenal artery is slightly irregular with spasm in the proximal segment. No evidence of active  extravasation. A glidewire was successfully advanced into the common hepatic artery and the C2 cobra catheter was advanced into the common hepatic artery. Additional arteriography was performed in multiple obliquities. Again, the proximal gastroduodenal artery is irregular and spasmodic. No evidence of dissection, aneurysm or active bleeding. A Lantern microcatheter was then advanced over a Fathom 16 wire and used to select the distal aspect of the gastroduodenal artery. Arteriography was performed. The gastroepiploic arteries are widely patent. No evidence of active bleeding. Coil embolization was then performed using a 4 mm Ruby POD detachable micro coil followed by a 45 cm Ruby packing coil. Post embolization arteriography demonstrates complete occlusion of the vessel with stasis in the proximal segment. The Lantern microcatheter was brought back into the common hepatic artery and arteriography was performed. Again, complete occlusion of the gastroduodenal artery. The right gastric artery is visualized. No evidence of active bleeding from the right gastric artery. The microcatheter was removed. The C2 cobra catheter was used to select the superior mesenteric artery. Arteriography was performed. Patent pancreaticoduodenal arcade supplying the gastroepiploic artery. No evidence of active hemorrhage. The C2 cobra catheter was removed. A limited right common femoral arteriogram was performed confirming common femoral arterial access. Hemostasis was attained with the assistance of a Cordis ExoSeal extra arterial vascular plug. IMPRESSION: 1. No evidence of active hemorrhage. 2. 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 3. Prophylactic coil embolization of the gastroduodenal artery. Signed, Criselda Peaches, MD, [place] [facility] Radiology Electronically Signed   By: Dellis Filbert.D.  On: 09/07/2018 22:32   Dg Chest Port 1 View  Result  Date: 09/09/2018 CLINICAL DATA:  Respiratory failure EXAM: PORTABLE CHEST 1 VIEW COMPARISON:  09/07/2018 FINDINGS: Endotracheal tube and nasogastric catheter have been removed in the interval. Left jugular central line is again noted in the distal superior vena cava. The cardiac shadow is stable. The lungs are well aerated bilaterally with patchy bibasilar infiltrates and small left pleural effusion. IMPRESSION: Stable bibasilar infiltrates and small left pleural effusion. Electronically Signed   By: Inez Catalina M.D.   On: 09/09/2018 07:17   Dg Chest Port 1 View  Result Date: 09/07/2018 CLINICAL DATA:  Status post fasciotomy and hematoma back UA shins in the right lower leg 2 days ago. Intubated patient. EXAM: PORTABLE CHEST 1 VIEW COMPARISON:  CT scan of the chest of September 06, 2018 FINDINGS: The lungs are well-expanded. There are patchy increased densities at both bases which are slightly more conspicuous today. There is a small right and and somewhat larger left pleural effusion. The heart and pulmonary vascularity are normal. The endotracheal tube tip projects 4.1 cm above the carina. The esophagogastric tube tip in proximal port project below the GE junction. The left internal jugular venous catheter tip projects over the midportion of the SVC. IMPRESSION: Bibasilar atelectasis or pneumonia. Small right pleural effusion and somewhat larger left pleural effusion. The support tubes are in reasonable position. Electronically Signed   By: David  Martinique M.D.   On: 09/07/2018 13:27   Vas [place] Burnard Bunting With/wo Tbi  Result Date: 09/15/2018 LOWER EXTREMITY DOPPLER STUDY Indications: Peripheral artery disease.  Vascular Interventions: Status post right popliteal - tibial embolectomy and                         fasciotomy. Comparison Study: No exam for comparison Performing Technologist: Birdena Crandall, [place] [facility]  Examination Guidelines: A complete evaluation includes at minimum, Doppler waveform signals and systolic  blood pressure reading at the level of bilateral brachial, anterior tibial, and posterior tibial arteries, when vessel segments are accessible. Bilateral testing is considered an integral part of a complete examination. Photoelectric Plethysmograph (PPG) waveforms and toe systolic pressure readings are included as required and additional duplex testing as needed. Limited examinations for reoccurring indications may be performed as noted.  ABI Findings: +--------+------------------+-----+----------+--------+ Right   Rt Pressure (mmHg)IndexWaveform  Comment  +--------+------------------+-----+----------+--------+ [id number]                    triphasic          +--------+------------------+-----+----------+--------+ PTA     179               1.36 triphasic          +--------+------------------+-----+----------+--------+ DP      152               1.15 monophasic         +--------+------------------+-----+----------+--------+ +--------+------------------+-----+---------+-------+ Left    Lt Pressure (mmHg)IndexWaveform Comment +--------+------------------+-----+---------+-------+ [id number]                    triphasic        +--------+------------------+-----+---------+-------+ PTA     176               1.33 triphasic        +--------+------------------+-----+---------+-------+ DP      162               1.23 triphasic        +--------+------------------+-----+---------+-------+ +-------+-----------+-----------+------------+------------+  ABI/TBIToday's ABIToday's TBIPrevious ABIPrevious TBI +-------+-----------+-----------+------------+------------+ Right  1.36                                           +-------+-----------+-----------+------------+------------+ Left   1.33                                           +-------+-----------+-----------+------------+------------+  Summary: Right: Resting right ankle-brachial index indicates noncompressible  right lower extremity arteries. Left: Resting left ankle-brachial index indicates noncompressible left lower extremity arteries.  *See table(s) above for measurements and observations.  Electronically signed by Harold Barban MD on 09/15/2018 at 1:50:47 PM.   Final    Ct Renal Stone Study  Result Date: 08/22/2018 CLINICAL DATA:  Left flank pain. Nausea vomiting for 4 days. Previous appendectomy. EXAM: CT ABDOMEN AND PELVIS WITHOUT CONTRAST TECHNIQUE: Multidetector CT imaging of the abdomen and pelvis was performed following the standard protocol without IV contrast. COMPARISON:  CT scan December 19, 2017 FINDINGS: Lower chest: No acute abnormality. Hepatobiliary: No focal liver abnormality is seen. No gallstones, gallbladder wall thickening, or biliary dilatation. Pancreas: Unremarkable. No pancreatic ductal dilatation or surrounding inflammatory changes. Spleen: Normal in size without focal abnormality. Adrenals/Urinary Tract: Adrenal glands are normal. A 3 mm stone is again noted in the mid right kidney. No hydronephrosis or perinephric stranding. Bilateral extrarenal pelvises. No ureterectasis or ureteral stones noted. The bladder is unremarkable. Stomach/Bowel: The stomach is distended. Small bowel is normal. Moderate to severe fecal loading throughout the colon with a stool ball in the rectum. By report, the patient is status post appendectomy. 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 Atherosclerotic changes seen in the aorta, iliac vessels, and femoral vessels. Reproductive: Prostate is unremarkable. Other: No abdominal wall hernia or abnormality. No abdominopelvic ascites. Musculoskeletal: No acute or significant osseous findings. IMPRESSION: 1. Nonobstructive 3 mm stone in the right kidney.  No renal obstruction or other stones noted. 2. Gastric distention of uncertain etiology. 3. Moderate to severe fecal loading throughout the colon. 4. Atherosclerotic changes throughout the abdominal aorta and iliac vessels. 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 Recommend followup by ultrasound in 3 years. This recommendation follows ACR consensus guidelines: White Paper of the ACR Incidental Findings Committee II on Vascular Findings. Joellyn Rued Radiol 2013; 51:[id number] Electronically Signed   By: Dorise Bullion III M.D   On: 08/22/2018 19:54   Ir Embo Art  [facility] Guide Roadmapping  Result Date: 09/07/2018 INDICATION: 57 year old male with large volume upper GI bleed secondary to a very large duodenal ulcer. Patient underwent endoscopic intervention but continues to require pressors and large volume transfusion. He presents for mesenteric arteriography and embolization of any active bleeding and prophylactic embolization of the gastroduodenal artery. EXAM: IR ULTRASOUND GUIDANCE VASC ACCESS RIGHT; ADDITIONAL ARTERIOGRAPHY; IR EMBO ART VEN HEMORR LYMPH EXTRAV INC GUIDE ROADMAPPING; SELECTIVE VISCERAL ARTERIOGRAPHY 1. Ultrasound-guided vascular access 2. Catheterization of the left gastric artery where it arises directly from the  aorta with arteriogram 3. Catheterization of the celiac artery with arteriogram 4. Catheterization of the common hepatic artery with arteriogram 5. Catheterization of the gastroduodenal artery with arteriogram 6. Coil embolization of the gastroduodenal artery 7. Catheterization of the superior mesenteric artery with arteriogram MEDICATIONS: None ANESTHESIA/SEDATION: Patient intubated and on propofol drip CONTRAST:  70 mL Isovue 370 FLUOROSCOPY TIME:  Fluoroscopy Time: 11 minutes 30 seconds (487 mGy). COMPLICATIONS: None immediate. PROCEDURE: Informed consent was  obtained from the patient following explanation of the procedure, risks, benefits and alternatives. The patient understands, agrees and consents for the procedure. All questions were addressed. A time out was performed prior to the initiation of the procedure. Maximal barrier sterile technique utilized including caps, mask, sterile gowns, sterile gloves, large sterile drape, hand hygiene, and Betadine prep. The right common femoral artery was interrogated with ultrasound and found to be widely patent. An image was obtained and stored for the medical record. Local anesthesia was attained by infiltration with 1% lidocaine. A small dermatotomy was made. Under real-time sonographic guidance, the vessel was punctured with a 21 gauge micropuncture needle. Using standard technique, the initial micro needle was exchanged over a 0.018 micro wire for a transitional 4 [place] micro sheath. The micro sheath was then exchanged over a 0.035 wire for a 5 French vascular sheath. A C2 cobra catheter was advanced in the abdominal aorta over a Bentson wire. The catheter was used to select the first vessel arising from the ventral aorta. Contrast was injected. This is a left gastric artery arising directly from the aorta. Anatomy is normal. No evidence of active bleeding. The Cobra catheter was then used to select the origin of the celiac artery. Arteriography was performed. The splenic artery is small in caliber. The hepatic artery demonstrates normal hepatic arterial anatomy. The gastroduodenal artery is slightly irregular with spasm in the proximal segment. No evidence of active extravasation. A glidewire was successfully advanced into the common hepatic artery and the C2 cobra catheter was advanced into the common hepatic artery. Additional arteriography was performed in multiple obliquities. Again, the proximal gastroduodenal artery is irregular and spasmodic. No evidence of dissection, aneurysm or active bleeding. A Lantern  microcatheter was then advanced over a Fathom 16 wire and used to select the distal aspect of the gastroduodenal artery. Arteriography was performed. The gastroepiploic arteries are widely patent. No evidence of active bleeding. Coil embolization was then performed using a 4 mm Ruby POD detachable micro coil followed by a 45 cm Ruby packing coil. Post embolization arteriography demonstrates complete occlusion of the vessel with stasis in the proximal segment. The Lantern microcatheter was brought back into the common hepatic artery and arteriography was performed. Again, complete occlusion of the gastroduodenal artery. The right gastric artery is visualized. No evidence of active bleeding from the right gastric artery. The microcatheter was removed. The C2 cobra catheter was used to select the superior mesenteric artery. Arteriography was performed. Patent pancreaticoduodenal arcade supplying the gastroepiploic artery. No evidence of active hemorrhage. The C2 cobra catheter was removed. A limited right common femoral arteriogram was performed confirming common femoral arterial access. Hemostasis was attained with the assistance of a Cordis ExoSeal extra arterial vascular plug. IMPRESSION: 1. No evidence of active hemorrhage. 2. 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 3. Prophylactic coil embolization of the gastroduodenal artery. Signed, Criselda Peaches, MD, [facility] Electronically Signed   By: Jacqulynn Cadet M.D.   On: 09/07/2018 22:32   Ct  Angio Abd/pel W/ And/or W/o  Result Date: 09/11/2018 CLINICAL DATA:  Status postoperative right popliteal and tibial embolectomy for embolic disease. Known bilateral iliac artery aneurysmal disease. EXAM: CT ANGIOGRAPHY ABDOMEN AND PELVIS WITH CONTRAST TECHNIQUE: Multidetector CT imaging of the abdomen and pelvis was performed using the standard protocol during bolus  administration of intravenous contrast. Multiplanar reconstructed images and MIPs were obtained and reviewed to evaluate the vascular anatomy. CONTRAST:  133m ISOVUE-370 IOPAMIDOL (ISOVUE-370) INJECTION 76% COMPARISON:  CTA of the abdomen on 01/30/2018 and CT of the abdomen and pelvis with contrast on 12/19/2017. FINDINGS: VASCULAR Aorta: Stable mild dilatation of the infrarenal aorta with maximal diameter of 3.1 cm. Celiac: Mild origin stenosis of 25-30%. Distal branches are patent. Status post transcatheter coil embolization of the gastroduodenal artery on 09/07/2018. Hepatic arteries are normally patent. SMA: Normally patent. Renals: Bilateral single renal arteries demonstrate normal patency. IMA: Normally patent. Inflow: Fusiform aneurysmal disease present of bilateral common iliac arteries as previously demonstrated. Maximal caliber of the right common iliac artery is 2.8-2.9 cm and maximal caliber of the right common iliac artery is 2.7 cm. These measurements are stable since prior studies. The significant change since the prior studies is a much more prominent and new component of mural thrombus in both common iliac arteries, right greater than left. In the proximal right common iliac artery, mural thrombus narrows the aneurysmal lumen by at least 50% in its narrowest portion. Eccentric mural thrombus is present at the origin of the right common iliac artery and also distally just prior to the common iliac bifurcation without significant luminal stenosis. Aneurysmal disease again noted of bilateral internal iliac arteries with the left measuring approximately 2.5 cm in greatest diameter and the right measuring 1.5 cm. Both internal iliac arteries also demonstrate significant mural thrombus without occlusion. Mural thrombus is stable to slightly more prominent compared to the 12/19/2017 study. Bilateral external iliac arteries are normally patent and demonstrate diffuse arteriomegaly with maximum caliber 1.5  cm. Proximal Outflow: Bilateral common femoral arteries demonstrate dilatation with the left measuring up to 2.1 cm in diameter. Mural thrombus is present along the posterior lumen with approximately 50% reduction in luminal diameter. No thrombus is seen extending into proximal SFA or profunda femoral arteries. On the right side the common femoral artery measures up to 1.8 cm in the femoral bifurcation appears widely patent. Veins: Delayed venous phase imaging shows patent venous structures without evidence of thrombus. There is some probable mass effect of ectatic external iliac arteries on the distal aspects of bilateral external iliac veins without evidence of associated deep venous thrombosis or significant collateral vein formation. Review of the MIP images confirms the above findings. NON-VASCULAR Lower chest: Small bilateral pleural effusions with bibasilar atelectasis. Hepatobiliary: No focal liver abnormality is seen. No gallstones, gallbladder wall thickening, or biliary dilatation. Pancreas: Since the March study, there is a change in appearance of the pancreas on the venous phase of imaging. The tail of the pancreas now appears progressively atrophic with associated pancreatic ductal dilatation up to approximately 7 mm. At the junction of the body and head of the pancreas, there is suggestion a potential ill-defined low-attenuation mass that could measure as much as 3 cm in AP diameter. Borders are very difficult to delineate by CT. There is no associated biliary obstruction. 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 Recommend eventual correlation with MRI of the abdomen with and without gadolinium. There may be a window of time recommended to wait to perform MRI after placement  of the gastroduodenal artery Ruby coils. This can be further investigated based on manufacturer recommendation. If there may be a significant delay in ability to perform MRI, consider endoscopic ultrasound to investigate  the pancreas. Spleen: Normal in size without focal abnormality. Adrenals/Urinary Tract: Adrenal glands are unremarkable. Kidneys are normal, without renal calculi, focal lesion, or hydronephrosis. Bladder is unremarkable. Stomach/Bowel: Gastric fold thickening and prominent fold thickening of the duodenum may be consistent with known peptic ulcer disease. No evidence of bowel obstruction or free intraperitoneal air. No focal abscess identified. Lymphatic: No enlarged lymph nodes identified. Reproductive: Prostate is unremarkable. Other: Diffuse anasarca of the body wall and edema of the mesenteric and retroperitoneal fat likely relate to low albumin and malnutrition. Musculoskeletal: Moderate degenerative disc disease at L5-S1. IMPRESSION: VASCULAR 1. 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 No complications evident after embolization. 2. Stable mild aneurysmal disease of the distal abdominal aorta measuring 3.1 cm. 3. 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 The major change since prior imaging is significant increase in mural thrombus within the dilated common iliac arteries bilaterally, especially on the right. This is most likely the source distal emboli to the right distal leg. Mural thrombus in the internal iliac artery aneurysms is stable to slightly more prominent. 4. Stable diffuse enlargement bilateral external iliac and common femoral arteries. The left common femoral artery does contain significant posterior wall mural thrombus. NON-VASCULAR 1. The most significant nonvascular finding is potential development of a mass of the pancreas at the juncture of the head and body causing progressive atrophy of the tail of the pancreas with associated pancreatic ductal dilatation. On the venous phase of imaging, delineation of the potential mass is quite vague but the region of mass may measure as much as 3 cm. There may be some issue  with immediate performance of MRI in the setting recent coiling of the gastroduodenal artery even if the coils are MRI compatible. This can be checked with the manufacturer recommendation. If MRI can be performed immediately, an MRI of the abdomen with and without gadolinium would be helpful for further evaluation. If there is going to be significant delay, consider EUS characterization of the pancreas. 2. Diffuse body wall anasarca and edema of the mesenteric fat and retroperitoneum likely reflects low albumin and malnutrition. 3. Prominent gastric folds and duodenal folds without evidence of bowel obstruction or perforation. Electronically Signed   By: Aletta Edouard M.D.   On: 09/11/2018 12:36     LOS: 14 days   Signature  Lala Lund M.D on 09/17/2018 at 10:27 AM   -  To page go to [email or web address] - password [email or web address]

## 2018-09-18 ENCOUNTER — Inpatient Hospital Stay (HOSPITAL_COMMUNITY): Payer: Medicaid Other

## 2018-09-18 LAB — COMPREHENSIVE METABOLIC PANEL
ALBUMIN: 1.6 g/dL — AB (ref 3.5–5.0)
ALT: 24 U/L (ref 0–44)
AST: 19 U/L (ref 15–41)
Alkaline Phosphatase: 69 U/L (ref 38–126)
Anion gap: 8 (ref 5–15)
BILIRUBIN TOTAL: 0.6 mg/dL (ref 0.3–1.2)
BUN: 7 mg/dL (ref 6–20)
CO2: 25 mmol/L (ref 22–32)
Calcium: 7.5 mg/dL — ABNORMAL LOW (ref 8.9–10.3)
Chloride: 99 mmol/L (ref 98–111)
Creatinine, Ser: 0.59 mg/dL — ABNORMAL LOW (ref 0.61–1.24)
GFR calc Af Amer: >60 mL/min (ref >60)
GFR calc non Af Amer: >60 mL/min (ref >60)
Glucose, Bld: 101 mg/dL — ABNORMAL HIGH (ref 70–99)
Potassium: 3.9 mmol/L (ref 3.5–5.1)
Sodium: 132 mmol/L — ABNORMAL LOW (ref 135–145)
Total Protein: 4.6 g/dL — ABNORMAL LOW (ref 6.5–8.1)

## 2018-09-18 LAB — GLUCOSE, CAPILLARY
Glucose-Capillary: 133 mg/dL — ABNORMAL HIGH (ref 70–99)
Glucose-Capillary: 113 mg/dL — ABNORMAL HIGH (ref 70–99)
Glucose-Capillary: 89 mg/dL (ref 70–99)
Glucose-Capillary: 57 mg/dL — ABNORMAL LOW (ref 70–99)
Glucose-Capillary: 79 mg/dL (ref 70–99)

## 2018-09-18 LAB — URINALYSIS, ROUTINE W REFLEX MICROSCOPIC
Bilirubin Urine: NEGATIVE
GLUCOSE, UA: NEGATIVE mg/dL
Hgb urine dipstick: NEGATIVE
Ketones, ur: NEGATIVE mg/dL
Nitrite: NEGATIVE
Protein, ur: NEGATIVE mg/dL
Specific Gravity, Urine: 1.011 (ref 1.005–1.030)
WBC, UA: >50 WBC/hpf — ABNORMAL HIGH (ref 0–5)
pH: 6 (ref 5.0–8.0)

## 2018-09-18 LAB — CBC
HCT: 25.6 % — ABNORMAL LOW (ref 39.0–52.0)
Hemoglobin: 8.6 g/dL — ABNORMAL LOW (ref 13.0–17.0)
MCH: 28.4 pg (ref 26.0–34.0)
MCHC: 33.6 g/dL (ref 30.0–36.0)
MCV: 84.5 fL (ref 80.0–100.0)
NRBC: 0 % (ref 0.0–0.2)
Platelets: 382 10*3/uL (ref 150–400)
RBC: 3.03 MIL/uL — ABNORMAL LOW (ref 4.22–5.81)
RDW: 14.9 % (ref 11.5–15.5)
WBC: 19.4 10*3/uL — ABNORMAL HIGH (ref 4.0–10.5)

## 2018-09-18 LAB — MAGNESIUM: Magnesium: 1.8 mg/dL (ref 1.7–2.4)

## 2018-09-18 NOTE — Progress Notes (Signed)
PROGRESS NOTE        PATIENT DETAILS Name: Ryan Medina Age: 57 y.o. Sex: male Date of Birth: May 02, 1961 Admit Date: 09/03/2018 Admitting Physician [name], MD [id number], No Pcp Per  Brief Narrative: Patient is a 57 y.o. male with history of cocaine use, chronic systolic heart failure presented to the hospital on 12/13 with a right ischemic leg secondary to acute popliteal and tibial embolus, underwent embolectomy on 12/13 and subsequently was placed on a heparin drip.  He unfortunately developed a acute right lower leg hematoma with compartment syndrome requiring fasciotomy on 12/15.  Further hospital course was complicated by development of hemorrhagic shock with acute blood loss anemia secondary to a bleeding duodenal ulcer-requiring ICU transfer-intubation for airway protection and IR evaluation with mesenteric angiogram and GDA embolization.  Upon stability he was transferred back to the triad hospitalist service on 12/19.  See below for further details.  Subjective:  Patient in bed, appears comfortable, denies any headache, no fever, no chest pain or pressure, no shortness of breath , no abdominal pain. No focal weakness.   Assessment/Plan:  Right ischemic leg secondary to popliteal artery/tibial artery embolism-s/p embolectomy on 25/05 complicated by right lower extremity hematoma with compartment syndrome requiring fasciotomy on 12/15:   Source of embolization unclear, required fasciotomy on 09/05/2018 by vascular surgery status post closure of fasciotomy on 09/10/2018.  His course was complicated by large hematoma in the right leg along with significant life-threatening GI bleed. Case discussed with vascular surgeon Dr. Monica Martinez who wants to continue anticoagulation as much as possible.   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  Kindly see anticoagulation below.  Cardiology for TEE has been consulted  as well.  Await report.  Upper GI bleeding with hemorrhagic shock and acute blood loss anemia: Developed hemorrhagic shock, was seen by GI, required multiple PRBC transfusions last transfusion on 09/09/2018.  Was seen by GI and eventually underwent gastroduodenal artery embolization on 09/07/2018.  Total 8 units of packed RBC, 1 unit of FFP and 1 unit of platelets transfused around 09/08/2018. GI note noted from 09/10/2018 (okay with heparin challenge).  He was rechallenged with heparin cautiously on 09/12/2018 unfortunately he bled again on 09/14/2018 after which heparin was stopped and he received another 3 units of packed RBCs.  So far he has received 11 units of packed RBCs.  He then underwent EUS on 09/16/2018 which showed +ve Duodenal Ulcer which was still raw. Continue PPI and Hold Heparin indefinitely.    Hypokalemia and hypomagnesemia.  Replaced and now stable.    Urinary retention.  On Foley and Flomax.  Foley removed on 09/15/2018.     Acute hypoxic respiratory failure: Intubated when he developed hemorrhagic shock secondary to GI bleeding, successfully extubated-currently stable on 2 L of oxygen via nasal cannula.  Chronic systolic heart failure (EF 45-50% by TTE on 09/04/2018): Reasonably well compensated-follow weights, volume status, electrolytes closely.  Cocaine use/tobacco use: Counseled-I am still not sure if he has any intention of quitting.  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  MRI noted & confirms mass, CA 19.9 was elevated, GI seeing and underwent EUS with biopsy on 09/16/2018, hep C consistent with adenocarcinoma.  Will consult oncology Monday.  Nonspecific worsening leukocytosis.  Unclear etiology.  Repeat  two-view chest x-ray, check a UA and monitor.  Postop right leg wound site appears stable.  Could be stress-induced.    Insulin-dependent DM-2: Last A1c on 12/2 was 18.2.  Increase Lantus  for better control continue sliding scale.  CBG (last 3)  Recent Labs    09/17/18 2129 09/18/18 0818 09/18/18 0930  GLUCAP 95 133* 113*      DVT Prophylaxis: Heparin drip had to be stopped on 09/15/2018 due to GI bleed, will place SCDs in the left leg.  Code Status: Full code   Family Communication: Wife at bedside 12/25 & 12/26  Disposition Plan: Tele  Antimicrobial agents: Anti-infectives (From admission, onward)   Start     Dose/Rate Route Frequency Ordered Stop   09/10/18 2130  ceFAZolin (ANCEF) IVPB 2g/100 mL premix     2 g 200 mL/hr over 30 Minutes Intravenous Every 8 hours 09/10/18 1634 09/11/18 0700   09/10/18 0800  ceFAZolin (ANCEF) IVPB 1 g/50 mL premix    Note to Pharmacy:  Send with pt to OR   1 g 100 mL/hr over 30 Minutes Intravenous On call 09/09/18 0756 09/10/18 1407   09/07/18 1300  erythromycin 250 mg in sodium chloride 0.9 % 100 mL IVPB     250 mg 100 mL/hr over 60 Minutes Intravenous Once 09/07/18 1125 09/07/18 2351   09/06/18 0000  ceFAZolin (ANCEF) IVPB 1 g/50 mL premix    Note to Pharmacy:  Send with pt to OR   1 g 100 mL/hr over 30 Minutes Intravenous On call 09/05/18 0849 09/05/18 0952   09/03/18 2245  ceFAZolin (ANCEF) IVPB 2g/100 mL premix     2 g 200 mL/hr over 30 Minutes Intravenous Every 8 hours 09/03/18 2236 09/04/18 1444   09/03/18 1615  ceFAZolin (ANCEF) IVPB 2g/100 mL premix  Status:  Discontinued     2 g 200 mL/hr over 30 Minutes Intravenous On call to O.R. 09/03/18 1600 09/03/18 2035      Procedures:  12/17>>Mesenteric angiogram and coil embolization of the GDA  12/17>>EGD  12/15>> 1.  Evacuation hematoma right leg 2.  Evacuation of lymphocele right leg 3.  4 compartment fasciotomy 4.  Placement of VAC (medial and lateral)  12/13>> 1.  Ultrasound-guided access to the left common femoral artery 2.  Aortogram with bilateral iliac arteriogram 3.  Selective catheterization of the right external iliac artery with right  lower extremity runoff  MRI Abd.  Pancreatic mass.   TEE -   EUS  Impression:  - 3.6cm by 2.1cm mass in the neck of pancreas that surrounds the celiac trunk, obstructs the main pancreatic duct and appears to have also invaded the duodenal bulb created a large, malignant, friable ulcer. I biopsied the abnormal, firm mucosa surrounding the duodenal ulcer and sampled the pancreatic mass with transgastric EUS FNA. Preliminary cytology review from the pancreatic mass was positive for malignancy.   Recommendation   - Return patient to hospital [facility] for ongoing care. - He has recieved 13 units of blood this admission, 3 of them in the past 24 hours. He will continue to ooze from the duodenal ulcer that I suspect is malignant, related to the nearby large pancreatic mass. Judicious use of bloodthinners is strongly recommended. - Await final cytology/pathology reports; both hopefully available tomorrow.        CONSULTS:  pulmonary/intensive care, vascular surgery and IR, GI, Cards  Time spent: 40 minutes-Greater than 50% of this time was spent in counseling, explanation of diagnosis, planning of  further management, and coordination of care.  MEDICATIONS: Scheduled Meds: . atorvastatin  20 mg Oral Daily  . chlorhexidine gluconate (MEDLINE KIT)  15 mL Mouth Rinse BID  . insulin aspart  0-9 Units Subcutaneous TID WC  . insulin glargine  10 Units Subcutaneous Daily  . lidocaine  15 mL Oral Once  . mouth rinse  15 mL Mouth Rinse BID  . pantoprazole (PROTONIX) IV  40 mg Intravenous Q12H  . tamsulosin  0.4 mg Oral Daily   Continuous Infusions: . sodium chloride     PRN Meds:.sodium chloride, alum & mag hydroxide-simeth, famotidine, ipratropium-albuterol, LORazepam, morphine injection, [DISCONTINUED] ondansetron **OR** ondansetron (ZOFRAN) IV, oxyCODONE-acetaminophen, polyethylene glycol, senna-docusate, sodium chloride flush   PHYSICAL EXAM: Vital signs: Vitals:   09/17/18 0632  09/17/18 1508 09/17/18 2127 09/18/18 0453  BP: 120/81 139/89 116/85 116/82  Pulse: 86 96 (!) 105 (!) 101  Resp: 18  18 12   Temp: 98.7 F (37.1 C) 99.2 F (37.3 C) 100 F (37.8 C) 99.5 F (37.5 C)  TempSrc: Oral  Oral Oral  SpO2: 100% 100% 100% 99%  Weight:      Height:       Filed Weights   09/03/18 1650 09/16/18 1325  Weight: 66.7 kg 66.7 kg   Body mass index is 20.51 kg/m.   Exam  Awake Alert, Oriented X 3, No new F.N deficits, Normal affect [place].AT,PERRAL Supple Neck,No JVD, No cervical lymphadenopathy appriciated.  Symmetrical Chest wall movement, Good air movement bilaterally, CTAB RRR,No Gallops, Rubs or new Murmurs, No Parasternal Heave +ve B.Sounds, Abd Soft, No tenderness, No organomegaly appriciated, No rebound - guarding or rigidity. No Cyanosis, Clubbing or edema, No new Rash or bruise   I have personally reviewed following labs and imaging studies  LABORATORY DATA: CBC: Recent Labs  Lab 09/14/18 0214 09/15/18 0427 09/15/18 1918 09/15/18 2016 09/16/18 0550 09/17/18 0358 09/18/18 0426  WBC 7.9 12.0*  --   --  10.7* 13.3* 19.4*  HGB 8.6* 7.3* 6.6* 6.5* 9.1* 8.8* 8.6*  HCT 25.8* 22.8* 20.0* 20.1* 27.4* 25.9* 25.6*  MCV 83.5 85.4  --   --  83.5 84.4 84.5  PLT 290 330  --   --  290 354 726    Basic Metabolic Panel: Recent Labs  Lab 09/12/18 0415 09/13/18 0352 09/14/18 0214 09/15/18 0427 09/16/18 0550 09/17/18 0358 09/18/18 0426  NA 135 138 134* 135 134* 135 132*  K 3.4* 3.4* 3.6 3.7 3.8 3.4* 3.9  CL 104 100 95* 96* 102 101 99  CO2 23 26 27 30 23 25 25   GLUCOSE 297* 254* 263* 311* 138* 68* 101*  BUN 10 6 <5* 29* 15 8 7   CREATININE 0.75 0.49* 0.62 0.74 0.69 0.62 0.59*  CALCIUM 7.1* 7.4* 7.6* 7.4* 7.5* 7.5* 7.5*  MG 1.6* 1.7  --   --  1.7 1.6* 1.8    GFR: Estimated Creatinine Clearance: 96.1 mL/min (A) (by C-G formula based on SCr of 0.59 mg/dL (L)).  Liver Function Tests: Recent Labs  Lab 09/14/18 0214 09/15/18 0427 09/16/18 0550  09/17/18 0358 09/18/18 0426  AST 32 38 33 28 19  ALT 23 31 30 28 24   ALKPHOS 59 53 49 51 69  BILITOT 0.8 0.7 1.0 1.0 0.6  PROT 4.8* 4.6* 4.4* 4.4* 4.6*  ALBUMIN 1.6* 1.6* 1.6* 1.5* 1.6*   No results for input(s): LIPASE, AMYLASE in the last 168 hours. No results for input(s): AMMONIA in the last 168 hours.  Coagulation Profile: No results for  input(s): INR, PROTIME in the last 168 hours.  Cardiac Enzymes: No results for input(s): CKTOTAL, CKMB, CKMBINDEX, TROPONINI in the last 168 hours.  BNP (last 3 results) No results for input(s): PROBNP in the last 8760 hours.  HbA1C: No results for input(s): HGBA1C in the last 72 hours.  CBG: Recent Labs  Lab 09/17/18 1759 09/17/18 1942 09/17/18 2129 09/18/18 0818 09/18/18 0930  GLUCAP 84 81 95 133* 113*    Lipid Profile: No results for input(s): CHOL, HDL, LDLCALC, TRIG, CHOLHDL, LDLDIRECT in the last 72 hours.  Thyroid Function Tests: No results for input(s): TSH, T4TOTAL, FREET4, T3FREE, THYROIDAB in the last 72 hours.  Anemia Panel: No results for input(s): VITAMINB12, FOLATE, FERRITIN, TIBC, IRON, RETICCTPCT in the last 72 hours.  Urine analysis:    Component Value Date/Time   COLORURINE STRAW (A) 08/22/2018 2054   APPEARANCEUR CLEAR 08/22/2018 2054   LABSPEC 1.027 08/22/2018 2054   PHURINE 6.0 08/22/2018 2054   GLUCOSEU >=500 (A) 08/22/2018 2054   HGBUR NEGATIVE 08/22/2018 2054   [place] NEGATIVE 08/22/2018 2054   KETONESUR 80 (A) 08/22/2018 2054   PROTEINUR NEGATIVE 08/22/2018 2054   UROBILINOGEN 4.0 (H) 12/05/2016 0921   NITRITE NEGATIVE 08/22/2018 2054   LEUKOCYTESUR NEGATIVE 08/22/2018 2054    Sepsis Labs: Lactic Acid, Venous    Component Value Date/Time   LATICACIDVEN 1.7 09/07/2018 1600    MICROBIOLOGY: Recent Results (from the past 240 hour(s))  Surgical pcr screen     Status: None   Collection Time: 09/10/18  3:39 AM  Result Value Ref Range Status   MRSA, PCR NEGATIVE NEGATIVE Final    Staphylococcus aureus NEGATIVE NEGATIVE Final    Comment: (NOTE) The Xpert SA Assay (FDA approved for NASAL specimens in patients 65 years of age and older), is one component of a comprehensive surveillance program. It is not intended to diagnose infection nor to guide or monitor treatment. Performed at [facility], [place] [address] [place], [place] [address]     RADIOLOGY STUDIES/RESULTS: Dg Abd 1 View  Result Date: 09/07/2018 CLINICAL DATA:  OG tube placement. EXAM: ABDOMEN - 1 VIEW COMPARISON:  CT abdomen pelvis 08/22/2018. FINDINGS: OG tube is in place with the side port and tip in the body of the stomach. The stomach is distended. IMPRESSION: OG tube in good position. Electronically Signed   By: Inge Rise M.D.   On: 09/07/2018 13:31   Ct Angio Chest Pe W Or Wo Contrast  Result Date: 09/06/2018 CLINICAL DATA:  Evaluate thoracic aortic aneurysm. EXAM: CT ANGIOGRAPHY CHEST WITH CONTRAST TECHNIQUE: Multidetector CT imaging of the chest was performed using the standard protocol during bolus administration of intravenous contrast. Multiplanar CT image reconstructions and MIPs were obtained to evaluate the vascular anatomy. CONTRAST:  65 mL ISOVUE-370 IOPAMIDOL (ISOVUE-370) INJECTION 76% COMPARISON:  01/30/2018; 10/14/2016; 04/25/2026 FINDINGS: Vascular Findings: No evidence of thoracic aortic aneurysm with measurements as follows. No definite evidence of thoracic aortic dissection or periaortic stranding on this nongated examination. Bovine configuration of the aortic arch. The branch vessels of the aortic arch appear widely patent throughout their imaged courses. The descending thoracic aorta is of normal caliber and widely patent without hemodynamically significant stenosis. There is a very minimal amount of atherosclerotic plaque involving the proximal aspect of the descending thoracic aorta. Cardiomegaly.  No pericardial effusion. Although this examination was not tailored for the  evaluation the pulmonary arteries, there are no discrete filling defects within the central pulmonary arterial tree to suggest central  pulmonary embolism. Borderline enlarged caliber the main pulmonary artery measuring 32 mm in diameter (image 67, series 6) ------------------------------------------------------------- Thoracic aortic measurements: Sinotubular junction 33 mm as measured in greatest oblique coronal dimension. Proximal ascending aorta 35 mm as measured in greatest oblique axial dimension at the level of the main pulmonary artery (image 70, series 6); and approximately 36 mm in greatest oblique short axis coronal diameter (coronal image 48, series 9). Aortic arch aorta 28 mm as measured in greatest oblique sagittal dimension. Proximal descending thoracic aorta 30 mm as measured in greatest oblique axial dimension at the level of the main pulmonary artery. Distal descending thoracic aorta 29 mm as measured in greatest oblique axial dimension at the level of the diaphragmatic hiatus. Review of the MIP images confirms the above findings. ------------------------------------------------------------- Non-Vascular Findings: Mediastinum/Lymph Nodes: Prominent right hilar lymph nodes are not enlarged by size criteria with index right suprahilar lymph node measuring 0.8 cm in greatest short axis diameter (image 71, series 6) and index right infrahilar lymph node measuring 0.7 cm (image 88). No definitive bulky mediastinal, hilar or axillary lymphadenopathy. Lungs/Pleura: Interval development of small bilateral pleural effusions with worsening bibasilar consolidative opacities and associated air bronchograms, right greater than left. Interval development of an approximately 1.0 x 0.5 cm subpleural consolidative opacity within the right upper lobe (image 36, series 7). Unchanged punctate (approximately 3 mm) calcified granuloma within the left lower lobe (image 75, series 3). Evaluation for additional pulmonary  nodules is degraded secondary to bibasilar airspace opacities. The central pulmonary airways appear widely patent. Upper abdomen: Fluid distention of the mid and distal aspects of the esophagus with marked distension of the imaged superior aspect of the stomach. Musculoskeletal: No acute or aggressive osseous abnormalities. There is incomplete fusion involving the spinous processes of multiple thoracic vertebral bodies, unchanged. Regional soft tissues appear normal. Normal appearance of the thyroid gland. IMPRESSION: 1. No evidence of thoracic aortic aneurysm or dissection. 2. Small bilateral effusions with associated bibasilar consolidative opacities with associated air bronchograms, atelectasis versus infiltrate. Follow-up chest radiograph in 4-6 weeks after treatment is recommended to ensure resolution. 3. Fluid distension of the mid and distal aspects of the esophagus as well as marked distension of the imaged superior aspect stomach - correlation for gastric outlet obstructive symptoms is advised. 4. Cardiomegaly with enlargement of the caliber the main pulmonary artery as could be seen in the setting of pulmonary arterial hypertension. Electronically Signed   By: Sandi Mariscal M.D.   On: 09/06/2018 09:20   Mr Abdomen W Wo Contrast  Result Date: 09/12/2018 CLINICAL DATA:  Suspicion of pancreatic neck mass on CT. EXAM: MRI ABDOMEN WITHOUT AND WITH CONTRAST TECHNIQUE: Multiplanar multisequence MR imaging of the abdomen was performed both before and after the administration of intravenous contrast. CONTRAST:  6 cc Gadavist COMPARISON:  CT of 1 day prior. Stone study of 08/22/2018. More remote CT of 12/19/2017. FINDINGS: Moderatemultifactorial degradation, including respiratory motion and susceptibility artifact from embolization coils. Lower chest: Bilateral pleural fluid with bibasilar airspace disease. Normal heart size. Hepatobiliary: Left hepatic lobe 9 mm cyst. Too small to characterizea right hepatic  lobe lesion is most likely a cyst at 2 mm. Normal gallbladder, without biliary ductal dilatation. Pancreas: An area of soft tissue fullness and restricted diffusion is identified within the pancreatic neck, corresponding to the abnormality on CT. This measures on the order of 3.9 x 3.6 cm on image 76/12. Also 3.6 x 3.6 cm on image 35/31. Results in pancreatic duct dilatation within  the upstream body and tail on image 17/11. Involves the celiac and its branches including on image 75/12. Spleen:  Grossly within normal limits. Adrenals/Urinary Tract: Normal adrenal glands. Grossly normal kidneys, without hydronephrosis. Stomach/Bowel: Gastric distension with proximal wall and fold thickening, including on image 73/12. Grossly normal abdominal bowel loops. Vascular/Lymphatic: Normal caliber of the aorta. 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 Limited evaluation for abdominal adenopathy. Other:  Small volume abdominal ascites. Musculoskeletal: No acute osseous abnormality. IMPRESSION: 1. Moderate motion and susceptibility artifact degradation throughout. 2. Confirmation of a pancreatic neck mass, most consistent with adenocarcinoma. Involvement of the celiac and its branches, most consistent with a non-operative lesion. 3. Consider endoscopic ultrasound sampling. 4. Gastric wall and fold thickening, suggesting gastritis. 5. Small bilateral pleural effusions with adjacent airspace disease. Electronically Signed   By: Abigail Miyamoto M.D.   On: 09/12/2018 03:04   Ir Angiogram Visceral Selective  Result Date: 09/07/2018 INDICATION: 57 year old male with large volume upper GI bleed secondary to a very large duodenal ulcer. Patient underwent endoscopic intervention but continues to require pressors and large volume transfusion. He presents for mesenteric arteriography and embolization of any active bleeding and prophylactic embolization of the gastroduodenal artery. EXAM: IR ULTRASOUND GUIDANCE VASC ACCESS  RIGHT; ADDITIONAL ARTERIOGRAPHY; IR EMBO ART VEN HEMORR LYMPH EXTRAV INC GUIDE ROADMAPPING; SELECTIVE VISCERAL ARTERIOGRAPHY 1. Ultrasound-guided vascular access 2. Catheterization of the left gastric artery where it arises directly from the aorta with arteriogram 3. Catheterization of the celiac artery with arteriogram 4. Catheterization of the common hepatic artery with arteriogram 5. Catheterization of the gastroduodenal artery with arteriogram 6. Coil embolization of the gastroduodenal artery 7. Catheterization of the superior mesenteric artery with arteriogram MEDICATIONS: None ANESTHESIA/SEDATION: Patient intubated and on propofol drip CONTRAST:  70 mL Isovue 370 FLUOROSCOPY TIME:  Fluoroscopy Time: 11 minutes 30 seconds (487 mGy). COMPLICATIONS: None immediate. PROCEDURE: Informed consent was obtained from the patient following explanation of the procedure, risks, benefits and alternatives. The patient understands, agrees and consents for the procedure. All questions were addressed. A time out was performed prior to the initiation of the procedure. Maximal barrier sterile technique utilized including caps, mask, sterile gowns, sterile gloves, large sterile drape, hand hygiene, and Betadine prep. The right common femoral artery was interrogated with ultrasound and found to be widely patent. An image was obtained and stored for the medical record. Local anesthesia was attained by infiltration with 1% lidocaine. A small dermatotomy was made. Under real-time sonographic guidance, the vessel was punctured with a 21 gauge micropuncture needle. Using standard technique, the initial micro needle was exchanged over a 0.018 micro wire for a transitional 4 [place] micro sheath. The micro sheath was then exchanged over a 0.035 wire for a 5 French vascular sheath. A C2 cobra catheter was advanced in the abdominal aorta over a Bentson wire. The catheter was used to select the first vessel arising from the ventral aorta.  Contrast was injected. This is a left gastric artery arising directly from the aorta. Anatomy is normal. No evidence of active bleeding. The Cobra catheter was then used to select the origin of the celiac artery. Arteriography was performed. The splenic artery is small in caliber. The hepatic artery demonstrates normal hepatic arterial anatomy. The gastroduodenal artery is slightly irregular with spasm in the proximal segment. No evidence of active extravasation. A glidewire was successfully advanced into the common hepatic artery and the C2 cobra catheter was advanced into the common hepatic artery. Additional arteriography was performed in multiple  obliquities. Again, the proximal gastroduodenal artery is irregular and spasmodic. No evidence of dissection, aneurysm or active bleeding. A Lantern microcatheter was then advanced over a Fathom 16 wire and used to select the distal aspect of the gastroduodenal artery. Arteriography was performed. The gastroepiploic arteries are widely patent. No evidence of active bleeding. Coil embolization was then performed using a 4 mm Ruby POD detachable micro coil followed by a 45 cm Ruby packing coil. Post embolization arteriography demonstrates complete occlusion of the vessel with stasis in the proximal segment. The Lantern microcatheter was brought back into the common hepatic artery and arteriography was performed. Again, complete occlusion of the gastroduodenal artery. The right gastric artery is visualized. No evidence of active bleeding from the right gastric artery. The microcatheter was removed. The C2 cobra catheter was used to select the superior mesenteric artery. Arteriography was performed. Patent pancreaticoduodenal arcade supplying the gastroepiploic artery. No evidence of active hemorrhage. The C2 cobra catheter was removed. A limited right common femoral arteriogram was performed confirming common femoral arterial access. Hemostasis was attained with the  assistance of a Cordis ExoSeal extra arterial vascular plug. IMPRESSION: 1. No evidence of active hemorrhage. 2. 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 3. Prophylactic coil embolization of the gastroduodenal artery. Signed, Criselda Peaches, MD, [facility] Electronically Signed   By: Jacqulynn Cadet M.D.   On: 09/07/2018 22:32   Ir Angiogram Visceral Selective  Result Date: 09/07/2018 INDICATION: 57 year old male with large volume upper GI bleed secondary to a very large duodenal ulcer. Patient underwent endoscopic intervention but continues to require pressors and large volume transfusion. He presents for mesenteric arteriography and embolization of any active bleeding and prophylactic embolization of the gastroduodenal artery. EXAM: IR ULTRASOUND GUIDANCE VASC ACCESS RIGHT; ADDITIONAL ARTERIOGRAPHY; IR EMBO ART VEN HEMORR LYMPH EXTRAV INC GUIDE ROADMAPPING; SELECTIVE VISCERAL ARTERIOGRAPHY 1. Ultrasound-guided vascular access 2. Catheterization of the left gastric artery where it arises directly from the aorta with arteriogram 3. Catheterization of the celiac artery with arteriogram 4. Catheterization of the common hepatic artery with arteriogram 5. Catheterization of the gastroduodenal artery with arteriogram 6. Coil embolization of the gastroduodenal artery 7. Catheterization of the superior mesenteric artery with arteriogram MEDICATIONS: None ANESTHESIA/SEDATION: Patient intubated and on propofol drip CONTRAST:  70 mL Isovue 370 FLUOROSCOPY TIME:  Fluoroscopy Time: 11 minutes 30 seconds (487 mGy). COMPLICATIONS: None immediate. PROCEDURE: Informed consent was obtained from the patient following explanation of the procedure, risks, benefits and alternatives. The patient understands, agrees and consents for the procedure. All questions were addressed. A time out was performed prior to the initiation of the  procedure. Maximal barrier sterile technique utilized including caps, mask, sterile gowns, sterile gloves, large sterile drape, hand hygiene, and Betadine prep. The right common femoral artery was interrogated with ultrasound and found to be widely patent. An image was obtained and stored for the medical record. Local anesthesia was attained by infiltration with 1% lidocaine. A small dermatotomy was made. Under real-time sonographic guidance, the vessel was punctured with a 21 gauge micropuncture needle. Using standard technique, the initial micro needle was exchanged over a 0.018 micro wire for a transitional 4 [place] micro sheath. The micro sheath was then exchanged over a 0.035 wire for a 5 French vascular sheath. A C2 cobra catheter was advanced in the abdominal aorta over a Bentson wire. The catheter was used to select the first vessel arising from the ventral aorta. Contrast was injected. This is a  left gastric artery arising directly from the aorta. Anatomy is normal. No evidence of active bleeding. The Cobra catheter was then used to select the origin of the celiac artery. Arteriography was performed. The splenic artery is small in caliber. The hepatic artery demonstrates normal hepatic arterial anatomy. The gastroduodenal artery is slightly irregular with spasm in the proximal segment. No evidence of active extravasation. A glidewire was successfully advanced into the common hepatic artery and the C2 cobra catheter was advanced into the common hepatic artery. Additional arteriography was performed in multiple obliquities. Again, the proximal gastroduodenal artery is irregular and spasmodic. No evidence of dissection, aneurysm or active bleeding. A Lantern microcatheter was then advanced over a Fathom 16 wire and used to select the distal aspect of the gastroduodenal artery. Arteriography was performed. The gastroepiploic arteries are widely patent. No evidence of active bleeding. Coil embolization was then  performed using a 4 mm Ruby POD detachable micro coil followed by a 45 cm Ruby packing coil. Post embolization arteriography demonstrates complete occlusion of the vessel with stasis in the proximal segment. The Lantern microcatheter was brought back into the common hepatic artery and arteriography was performed. Again, complete occlusion of the gastroduodenal artery. The right gastric artery is visualized. No evidence of active bleeding from the right gastric artery. The microcatheter was removed. The C2 cobra catheter was used to select the superior mesenteric artery. Arteriography was performed. Patent pancreaticoduodenal arcade supplying the gastroepiploic artery. No evidence of active hemorrhage. The C2 cobra catheter was removed. A limited right common femoral arteriogram was performed confirming common femoral arterial access. Hemostasis was attained with the assistance of a Cordis ExoSeal extra arterial vascular plug. IMPRESSION: 1. No evidence of active hemorrhage. 2. 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 3. Prophylactic coil embolization of the gastroduodenal artery. Signed, Criselda Peaches, MD, [place] [facility] Radiology Electronically Signed   By: Jacqulynn Cadet M.D.   On: 09/07/2018 22:32   Ir Angiogram Selective Each Additional Vessel  Result Date: 09/07/2018 INDICATION: 57 year old male with large volume upper GI bleed secondary to a very large duodenal ulcer. Patient underwent endoscopic intervention but continues to require pressors and large volume transfusion. He presents for mesenteric arteriography and embolization of any active bleeding and prophylactic embolization of the gastroduodenal artery. EXAM: IR ULTRASOUND GUIDANCE VASC ACCESS RIGHT; ADDITIONAL ARTERIOGRAPHY; IR EMBO ART VEN HEMORR LYMPH EXTRAV INC GUIDE ROADMAPPING; SELECTIVE VISCERAL ARTERIOGRAPHY 1. Ultrasound-guided vascular access 2.  Catheterization of the left gastric artery where it arises directly from the aorta with arteriogram 3. Catheterization of the celiac artery with arteriogram 4. Catheterization of the common hepatic artery with arteriogram 5. Catheterization of the gastroduodenal artery with arteriogram 6. Coil embolization of the gastroduodenal artery 7. Catheterization of the superior mesenteric artery with arteriogram MEDICATIONS: None ANESTHESIA/SEDATION: Patient intubated and on propofol drip CONTRAST:  70 mL Isovue 370 FLUOROSCOPY TIME:  Fluoroscopy Time: 11 minutes 30 seconds (487 mGy). COMPLICATIONS: None immediate. PROCEDURE: Informed consent was obtained from the patient following explanation of the procedure, risks, benefits and alternatives. The patient understands, agrees and consents for the procedure. All questions were addressed. A time out was performed prior to the initiation of the procedure. Maximal barrier sterile technique utilized including caps, mask, sterile gowns, sterile gloves, large sterile drape, hand hygiene, and Betadine prep. The right common femoral artery was interrogated with ultrasound and found to be widely patent. An image was obtained and stored for the medical record. Local anesthesia was attained  by infiltration with 1% lidocaine. A small dermatotomy was made. Under real-time sonographic guidance, the vessel was punctured with a 21 gauge micropuncture needle. Using standard technique, the initial micro needle was exchanged over a 0.018 micro wire for a transitional 4 [place] micro sheath. The micro sheath was then exchanged over a 0.035 wire for a 5 French vascular sheath. A C2 cobra catheter was advanced in the abdominal aorta over a Bentson wire. The catheter was used to select the first vessel arising from the ventral aorta. Contrast was injected. This is a left gastric artery arising directly from the aorta. Anatomy is normal. No evidence of active bleeding. The Cobra catheter was then  used to select the origin of the celiac artery. Arteriography was performed. The splenic artery is small in caliber. The hepatic artery demonstrates normal hepatic arterial anatomy. The gastroduodenal artery is slightly irregular with spasm in the proximal segment. No evidence of active extravasation. A glidewire was successfully advanced into the common hepatic artery and the C2 cobra catheter was advanced into the common hepatic artery. Additional arteriography was performed in multiple obliquities. Again, the proximal gastroduodenal artery is irregular and spasmodic. No evidence of dissection, aneurysm or active bleeding. A Lantern microcatheter was then advanced over a Fathom 16 wire and used to select the distal aspect of the gastroduodenal artery. Arteriography was performed. The gastroepiploic arteries are widely patent. No evidence of active bleeding. Coil embolization was then performed using a 4 mm Ruby POD detachable micro coil followed by a 45 cm Ruby packing coil. Post embolization arteriography demonstrates complete occlusion of the vessel with stasis in the proximal segment. The Lantern microcatheter was brought back into the common hepatic artery and arteriography was performed. Again, complete occlusion of the gastroduodenal artery. The right gastric artery is visualized. No evidence of active bleeding from the right gastric artery. The microcatheter was removed. The C2 cobra catheter was used to select the superior mesenteric artery. Arteriography was performed. Patent pancreaticoduodenal arcade supplying the gastroepiploic artery. No evidence of active hemorrhage. The C2 cobra catheter was removed. A limited right common femoral arteriogram was performed confirming common femoral arterial access. Hemostasis was attained with the assistance of a Cordis ExoSeal extra arterial vascular plug. IMPRESSION: 1. No evidence of active hemorrhage. 2. Irregular/spastic segment of the proximal gastroduodenal  artery. 3. Prophylactic coil embolization of the gastroduodenal artery. Signed, Criselda Peaches, MD, [place] [facility] Radiology Electronically Signed   By: Jacqulynn Cadet M.D.   On: 09/07/2018 22:32   Ir Angiogram Selective Each Additional Vessel  Result Date: 09/07/2018 INDICATION: 57 year old male with large volume upper GI bleed secondary to a very large duodenal ulcer. Patient underwent endoscopic intervention but continues to require pressors and large volume transfusion. He presents for mesenteric arteriography and embolization of any active bleeding and prophylactic embolization of the gastroduodenal artery. EXAM: IR ULTRASOUND GUIDANCE VASC ACCESS RIGHT; ADDITIONAL ARTERIOGRAPHY; IR EMBO ART VEN HEMORR LYMPH EXTRAV INC GUIDE ROADMAPPING; SELECTIVE VISCERAL ARTERIOGRAPHY 1. Ultrasound-guided vascular access 2. Catheterization of the left gastric artery where it arises directly from the aorta with arteriogram 3. Catheterization of the celiac artery with arteriogram 4. Catheterization of the common hepatic artery with arteriogram 5. Catheterization of the gastroduodenal artery with arteriogram 6. Coil embolization of the gastroduodenal artery 7. Catheterization of the superior mesenteric artery with arteriogram MEDICATIONS: None ANESTHESIA/SEDATION: Patient intubated and on propofol drip CONTRAST:  70 mL Isovue 370 FLUOROSCOPY TIME:  Fluoroscopy Time: 11 minutes 30 seconds (487  mGy). COMPLICATIONS: None immediate. PROCEDURE: Informed consent was obtained from the patient following explanation of the procedure, risks, benefits and alternatives. The patient understands, agrees and consents for the procedure. All questions were addressed. A time out was performed prior to the initiation of the procedure. Maximal barrier sterile technique utilized including caps, mask, sterile gowns, sterile gloves, large sterile drape, hand hygiene, and Betadine prep.  The right common femoral artery was interrogated with ultrasound and found to be widely patent. An image was obtained and stored for the medical record. Local anesthesia was attained by infiltration with 1% lidocaine. A small dermatotomy was made. Under real-time sonographic guidance, the vessel was punctured with a 21 gauge micropuncture needle. Using standard technique, the initial micro needle was exchanged over a 0.018 micro wire for a transitional 4 [place] micro sheath. The micro sheath was then exchanged over a 0.035 wire for a 5 French vascular sheath. A C2 cobra catheter was advanced in the abdominal aorta over a Bentson wire. The catheter was used to select the first vessel arising from the ventral aorta. Contrast was injected. This is a left gastric artery arising directly from the aorta. Anatomy is normal. No evidence of active bleeding. The Cobra catheter was then used to select the origin of the celiac artery. Arteriography was performed. The splenic artery is small in caliber. The hepatic artery demonstrates normal hepatic arterial anatomy. The gastroduodenal artery is slightly irregular with spasm in the proximal segment. No evidence of active extravasation. A glidewire was successfully advanced into the common hepatic artery and the C2 cobra catheter was advanced into the common hepatic artery. Additional arteriography was performed in multiple obliquities. Again, the proximal gastroduodenal artery is irregular and spasmodic. No evidence of dissection, aneurysm or active bleeding. A Lantern microcatheter was then advanced over a Fathom 16 wire and used to select the distal aspect of the gastroduodenal artery. Arteriography was performed. The gastroepiploic arteries are widely patent. No evidence of active bleeding. Coil embolization was then performed using a 4 mm Ruby POD detachable micro coil followed by a 45 cm Ruby packing coil. Post embolization arteriography demonstrates complete occlusion of  the vessel with stasis in the proximal segment. The Lantern microcatheter was brought back into the common hepatic artery and arteriography was performed. Again, complete occlusion of the gastroduodenal artery. The right gastric artery is visualized. No evidence of active bleeding from the right gastric artery. The microcatheter was removed. The C2 cobra catheter was used to select the superior mesenteric artery. Arteriography was performed. Patent pancreaticoduodenal arcade supplying the gastroepiploic artery. No evidence of active hemorrhage. The C2 cobra catheter was removed. A limited right common femoral arteriogram was performed confirming common femoral arterial access. Hemostasis was attained with the assistance of a Cordis ExoSeal extra arterial vascular plug. IMPRESSION: 1. No evidence of active hemorrhage. 2. 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 3. Prophylactic coil embolization of the gastroduodenal artery. Signed, Criselda Peaches, MD, [place] [facility] Radiology Electronically Signed   By: Jacqulynn Cadet M.D.   On: 09/07/2018 22:32   Ir US Guide Vasc Access Right  Result Date: 09/07/2018 INDICATION: 57 year old male with large volume upper GI bleed secondary to a very large duodenal ulcer. Patient underwent endoscopic intervention but continues to require pressors and large volume transfusion. He presents for mesenteric arteriography and embolization of any active bleeding and prophylactic embolization of the gastroduodenal artery. EXAM: IR ULTRASOUND GUIDANCE VASC ACCESS RIGHT; ADDITIONAL ARTERIOGRAPHY; IR EMBO ART VEN  HEMORR LYMPH EXTRAV INC GUIDE ROADMAPPING; SELECTIVE VISCERAL ARTERIOGRAPHY 1. Ultrasound-guided vascular access 2. Catheterization of the left gastric artery where it arises directly from the aorta with arteriogram 3. Catheterization of the celiac artery with arteriogram 4. Catheterization of the  common hepatic artery with arteriogram 5. Catheterization of the gastroduodenal artery with arteriogram 6. Coil embolization of the gastroduodenal artery 7. Catheterization of the superior mesenteric artery with arteriogram MEDICATIONS: None ANESTHESIA/SEDATION: Patient intubated and on propofol drip CONTRAST:  70 mL Isovue 370 FLUOROSCOPY TIME:  Fluoroscopy Time: 11 minutes 30 seconds (487 mGy). COMPLICATIONS: None immediate. PROCEDURE: Informed consent was obtained from the patient following explanation of the procedure, risks, benefits and alternatives. The patient understands, agrees and consents for the procedure. All questions were addressed. A time out was performed prior to the initiation of the procedure. Maximal barrier sterile technique utilized including caps, mask, sterile gowns, sterile gloves, large sterile drape, hand hygiene, and Betadine prep. The right common femoral artery was interrogated with ultrasound and found to be widely patent. An image was obtained and stored for the medical record. Local anesthesia was attained by infiltration with 1% lidocaine. A small dermatotomy was made. Under real-time sonographic guidance, the vessel was punctured with a 21 gauge micropuncture needle. Using standard technique, the initial micro needle was exchanged over a 0.018 micro wire for a transitional 4 [place] micro sheath. The micro sheath was then exchanged over a 0.035 wire for a 5 French vascular sheath. A C2 cobra catheter was advanced in the abdominal aorta over a Bentson wire. The catheter was used to select the first vessel arising from the ventral aorta. Contrast was injected. This is a left gastric artery arising directly from the aorta. Anatomy is normal. No evidence of active bleeding. The Cobra catheter was then used to select the origin of the celiac artery. Arteriography was performed. The splenic artery is small in caliber. The hepatic artery demonstrates normal hepatic arterial anatomy. The  gastroduodenal artery is slightly irregular with spasm in the proximal segment. No evidence of active extravasation. A glidewire was successfully advanced into the common hepatic artery and the C2 cobra catheter was advanced into the common hepatic artery. Additional arteriography was performed in multiple obliquities. Again, the proximal gastroduodenal artery is irregular and spasmodic. No evidence of dissection, aneurysm or active bleeding. A Lantern microcatheter was then advanced over a Fathom 16 wire and used to select the distal aspect of the gastroduodenal artery. Arteriography was performed. The gastroepiploic arteries are widely patent. No evidence of active bleeding. Coil embolization was then performed using a 4 mm Ruby POD detachable micro coil followed by a 45 cm Ruby packing coil. Post embolization arteriography demonstrates complete occlusion of the vessel with stasis in the proximal segment. The Lantern microcatheter was brought back into the common hepatic artery and arteriography was performed. Again, complete occlusion of the gastroduodenal artery. The right gastric artery is visualized. No evidence of active bleeding from the right gastric artery. The microcatheter was removed. The C2 cobra catheter was used to select the superior mesenteric artery. Arteriography was performed. Patent pancreaticoduodenal arcade supplying the gastroepiploic artery. No evidence of active hemorrhage. The C2 cobra catheter was removed. A limited right common femoral arteriogram was performed confirming common femoral arterial access. Hemostasis was attained with the assistance of a Cordis ExoSeal extra arterial vascular plug. IMPRESSION: 1. No evidence of active hemorrhage. 2. 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 3. Prophylactic coil embolization of the gastroduodenal artery. Signed, Criselda Peaches,  MD, [facility] Radiology  Electronically Signed   By: Jacqulynn Cadet M.D.   On: 09/07/2018 22:32   Dg Chest Port 1 View  Result Date: 09/09/2018 CLINICAL DATA:  Respiratory failure EXAM: PORTABLE CHEST 1 VIEW COMPARISON:  09/07/2018 FINDINGS: Endotracheal tube and nasogastric catheter have been removed in the interval. Left jugular central line is again noted in the distal superior vena cava. The cardiac shadow is stable. The lungs are well aerated bilaterally with patchy bibasilar infiltrates and small left pleural effusion. IMPRESSION: Stable bibasilar infiltrates and small left pleural effusion. Electronically Signed   By: Inez Catalina M.D.   On: 09/09/2018 07:17   Dg Chest Port 1 View  Result Date: 09/07/2018 CLINICAL DATA:  Status post fasciotomy and hematoma back UA shins in the right lower leg 2 days ago. Intubated patient. EXAM: PORTABLE CHEST 1 VIEW COMPARISON:  CT scan of the chest of September 06, 2018 FINDINGS: The lungs are well-expanded. There are patchy increased densities at both bases which are slightly more conspicuous today. There is a small right and and somewhat larger left pleural effusion. The heart and pulmonary vascularity are normal. The endotracheal tube tip projects 4.1 cm above the carina. The esophagogastric tube tip in proximal port project below the GE junction. The left internal jugular venous catheter tip projects over the midportion of the SVC. IMPRESSION: Bibasilar atelectasis or pneumonia. Small right pleural effusion and somewhat larger left pleural effusion. The support tubes are in reasonable position. Electronically Signed   By: David  Martinique M.D.   On: 09/07/2018 13:27   Vas [place] Burnard Bunting With/wo Tbi  Result Date: 09/15/2018 LOWER EXTREMITY DOPPLER STUDY Indications: Peripheral artery disease.  Vascular Interventions: Status post right popliteal - tibial embolectomy and                         fasciotomy. Comparison Study: No exam for comparison Performing Technologist: Birdena Crandall, [place]  [facility]  Examination Guidelines: A complete evaluation includes at minimum, Doppler waveform signals and systolic blood pressure reading at the level of bilateral brachial, anterior tibial, and posterior tibial arteries, when vessel segments are accessible. Bilateral testing is considered an integral part of a complete examination. Photoelectric Plethysmograph (PPG) waveforms and toe systolic pressure readings are included as required and additional duplex testing as needed. Limited examinations for reoccurring indications may be performed as noted.  ABI Findings: +--------+------------------+-----+----------+--------+ Right   Rt Pressure (mmHg)IndexWaveform  Comment  +--------+------------------+-----+----------+--------+ [id number]                    triphasic          +--------+------------------+-----+----------+--------+ PTA     179               1.36 triphasic          +--------+------------------+-----+----------+--------+ DP      152               1.15 monophasic         +--------+------------------+-----+----------+--------+ +--------+------------------+-----+---------+-------+ Left    Lt Pressure (mmHg)IndexWaveform Comment +--------+------------------+-----+---------+-------+ [id number]                    triphasic        +--------+------------------+-----+---------+-------+ PTA     176               1.33 triphasic        +--------+------------------+-----+---------+-------+ DP      162  1.23 triphasic        +--------+------------------+-----+---------+-------+ +-------+-----------+-----------+------------+------------+ ABI/TBIToday's ABIToday's TBIPrevious ABIPrevious TBI +-------+-----------+-----------+------------+------------+ Right  1.36                                           +-------+-----------+-----------+------------+------------+ Left   1.33                                            +-------+-----------+-----------+------------+------------+  Summary: Right: Resting right ankle-brachial index indicates noncompressible right lower extremity arteries. Left: Resting left ankle-brachial index indicates noncompressible left lower extremity arteries.  *See table(s) above for measurements and observations.  Electronically signed by Harold Barban MD on 09/15/2018 at 1:50:47 PM.   Final    Ct Renal Stone Study  Result Date: 08/22/2018 CLINICAL DATA:  Left flank pain. Nausea vomiting for 4 days. Previous appendectomy. EXAM: CT ABDOMEN AND PELVIS WITHOUT CONTRAST TECHNIQUE: Multidetector CT imaging of the abdomen and pelvis was performed following the standard protocol without IV contrast. COMPARISON:  CT scan December 19, 2017 FINDINGS: Lower chest: No acute abnormality. Hepatobiliary: No focal liver abnormality is seen. No gallstones, gallbladder wall thickening, or biliary dilatation. Pancreas: Unremarkable. No pancreatic ductal dilatation or surrounding inflammatory changes. Spleen: Normal in size without focal abnormality. Adrenals/Urinary Tract: Adrenal glands are normal. A 3 mm stone is again noted in the mid right kidney. No hydronephrosis or perinephric stranding. Bilateral extrarenal pelvises. No ureterectasis or ureteral stones noted. The bladder is unremarkable. Stomach/Bowel: The stomach is distended. Small bowel is normal. Moderate to severe fecal loading throughout the colon with a stool ball in the rectum. By report, the patient is status post appendectomy. 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 Atherosclerotic changes seen in the aorta, iliac vessels, and femoral vessels. Reproductive: Prostate is unremarkable. Other: No abdominal wall hernia or abnormality. No  abdominopelvic ascites. Musculoskeletal: No acute or significant osseous findings. IMPRESSION: 1. Nonobstructive 3 mm stone in the right kidney. No renal obstruction or other stones noted. 2. Gastric distention of uncertain etiology. 3. Moderate to severe fecal loading throughout the colon. 4. Atherosclerotic changes throughout the abdominal aorta and iliac vessels. 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 Recommend followup by ultrasound in 3 years. This recommendation follows ACR consensus guidelines: White Paper of the ACR Incidental Findings Committee II on Vascular Findings. Joellyn Rued Radiol 2013; [id number] Electronically Signed   By: Dorise Bullion III M.D   On: 08/22/2018 19:54   Ir Embo Art  Carlos Guide Roadmapping  Result Date: 09/07/2018 INDICATION: 57 year old male with large volume upper GI bleed secondary to a very large duodenal ulcer. Patient underwent endoscopic intervention but continues to require pressors and large volume transfusion. He presents for mesenteric arteriography and embolization of any active bleeding and prophylactic embolization of the gastroduodenal artery. EXAM: IR ULTRASOUND GUIDANCE VASC ACCESS RIGHT; ADDITIONAL ARTERIOGRAPHY; IR EMBO ART VEN HEMORR LYMPH EXTRAV INC GUIDE ROADMAPPING; SELECTIVE VISCERAL ARTERIOGRAPHY 1. Ultrasound-guided vascular access 2. Catheterization  of the left gastric artery where it arises directly from the aorta with arteriogram 3. Catheterization of the celiac artery with arteriogram 4. Catheterization of the common hepatic artery with arteriogram 5. Catheterization of the gastroduodenal artery with arteriogram 6. Coil embolization of the gastroduodenal artery 7. Catheterization of the superior mesenteric artery with arteriogram MEDICATIONS: None ANESTHESIA/SEDATION: Patient intubated and on propofol drip CONTRAST:  70  mL Isovue 370 FLUOROSCOPY TIME:  Fluoroscopy Time: 11 minutes 30 seconds (487 mGy). COMPLICATIONS: None immediate. PROCEDURE: Informed consent was obtained from the patient following explanation of the procedure, risks, benefits and alternatives. The patient understands, agrees and consents for the procedure. All questions were addressed. A time out was performed prior to the initiation of the procedure. Maximal barrier sterile technique utilized including caps, mask, sterile gowns, sterile gloves, large sterile drape, hand hygiene, and Betadine prep. The right common femoral artery was interrogated with ultrasound and found to be widely patent. An image was obtained and stored for the medical record. Local anesthesia was attained by infiltration with 1% lidocaine. A small dermatotomy was made. Under real-time sonographic guidance, the vessel was punctured with a 21 gauge micropuncture needle. Using standard technique, the initial micro needle was exchanged over a 0.018 micro wire for a transitional 4 [place] micro sheath. The micro sheath was then exchanged over a 0.035 wire for a 5 French vascular sheath. A C2 cobra catheter was advanced in the abdominal aorta over a Bentson wire. The catheter was used to select the first vessel arising from the ventral aorta. Contrast was injected. This is a left gastric artery arising directly from the aorta. Anatomy is normal. No evidence of active bleeding. The Cobra catheter was then used to select the origin of the celiac artery. Arteriography was performed. The splenic artery is small in caliber. The hepatic artery demonstrates normal hepatic arterial anatomy. The gastroduodenal artery is slightly irregular with spasm in the proximal segment. No evidence of active extravasation. A glidewire was successfully advanced into the common hepatic artery and the C2 cobra catheter was advanced into the common hepatic artery. Additional arteriography was performed in multiple  obliquities. Again, the proximal gastroduodenal artery is irregular and spasmodic. No evidence of dissection, aneurysm or active bleeding. A Lantern microcatheter was then advanced over a Fathom 16 wire and used to select the distal aspect of the gastroduodenal artery. Arteriography was performed. The gastroepiploic arteries are widely patent. No evidence of active bleeding. Coil embolization was then performed using a 4 mm Ruby POD detachable micro coil followed by a 45 cm Ruby packing coil. Post embolization arteriography demonstrates complete occlusion of the vessel with stasis in the proximal segment. The Lantern microcatheter was brought back into the common hepatic artery and arteriography was performed. Again, complete occlusion of the gastroduodenal artery. The right gastric artery is visualized. No evidence of active bleeding from the right gastric artery. The microcatheter was removed. The C2 cobra catheter was used to select the superior mesenteric artery. Arteriography was performed. Patent pancreaticoduodenal arcade supplying the gastroepiploic artery. No evidence of active hemorrhage. The C2 cobra catheter was removed. A limited right common femoral arteriogram was performed confirming common femoral arterial access. Hemostasis was attained with the assistance of a Cordis ExoSeal extra arterial vascular plug. IMPRESSION: 1. No evidence of active hemorrhage. 2. 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 3. Prophylactic coil embolization of the gastroduodenal artery. Signed, Criselda Peaches, MD, [facility] Electronically Signed   By: Myrle Sheng  Laurence Ferrari M.D.   On: 09/07/2018 22:32   Ct Angio Abd/pel W/ And/or W/o  Result Date: 09/11/2018 CLINICAL DATA:  Status postoperative right popliteal and tibial embolectomy for embolic disease. Known bilateral iliac artery aneurysmal disease. EXAM: CT ANGIOGRAPHY ABDOMEN  AND PELVIS WITH CONTRAST TECHNIQUE: Multidetector CT imaging of the abdomen and pelvis was performed using the standard protocol during bolus administration of intravenous contrast. Multiplanar reconstructed images and MIPs were obtained and reviewed to evaluate the vascular anatomy. CONTRAST:  172m ISOVUE-370 IOPAMIDOL (ISOVUE-370) INJECTION 76% COMPARISON:  CTA of the abdomen on 01/30/2018 and CT of the abdomen and pelvis with contrast on 12/19/2017. FINDINGS: VASCULAR Aorta: Stable mild dilatation of the infrarenal aorta with maximal diameter of 3.1 cm. Celiac: Mild origin stenosis of 25-30%. Distal branches are patent. Status post transcatheter coil embolization of the gastroduodenal artery on 09/07/2018. Hepatic arteries are normally patent. SMA: Normally patent. Renals: Bilateral single renal arteries demonstrate normal patency. IMA: Normally patent. Inflow: Fusiform aneurysmal disease present of bilateral common iliac arteries as previously demonstrated. Maximal caliber of the right common iliac artery is 2.8-2.9 cm and maximal caliber of the right common iliac artery is 2.7 cm. These measurements are stable since prior studies. The significant change since the prior studies is a much more prominent and new component of mural thrombus in both common iliac arteries, right greater than left. In the proximal right common iliac artery, mural thrombus narrows the aneurysmal lumen by at least 50% in its narrowest portion. Eccentric mural thrombus is present at the origin of the right common iliac artery and also distally just prior to the common iliac bifurcation without significant luminal stenosis. Aneurysmal disease again noted of bilateral internal iliac arteries with the left measuring approximately 2.5 cm in greatest diameter and the right measuring 1.5 cm. Both internal iliac arteries also demonstrate significant mural thrombus without occlusion. Mural thrombus is stable to slightly more prominent compared  to the 12/19/2017 study. Bilateral external iliac arteries are normally patent and demonstrate diffuse arteriomegaly with maximum caliber 1.5 cm. Proximal Outflow: Bilateral common femoral arteries demonstrate dilatation with the left measuring up to 2.1 cm in diameter. Mural thrombus is present along the posterior lumen with approximately 50% reduction in luminal diameter. No thrombus is seen extending into proximal SFA or profunda femoral arteries. On the right side the common femoral artery measures up to 1.8 cm in the femoral bifurcation appears widely patent. Veins: Delayed venous phase imaging shows patent venous structures without evidence of thrombus. There is some probable mass effect of ectatic external iliac arteries on the distal aspects of bilateral external iliac veins without evidence of associated deep venous thrombosis or significant collateral vein formation. Review of the MIP images confirms the above findings. NON-VASCULAR Lower chest: Small bilateral pleural effusions with bibasilar atelectasis. Hepatobiliary: No focal liver abnormality is seen. No gallstones, gallbladder wall thickening, or biliary dilatation. Pancreas: Since the March study, there is a change in appearance of the pancreas on the venous phase of imaging. The tail of the pancreas now appears progressively atrophic with associated pancreatic ductal dilatation up to approximately 7 mm. At the junction of the body and head of the pancreas, there is suggestion a potential ill-defined low-attenuation mass that could measure as much as 3 cm in AP diameter. Borders are very difficult to delineate by CT. There is no associated biliary obstruction. 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 Recommend eventual correlation with MRI of the abdomen with and without gadolinium. There may be a window  of time recommended to wait to perform MRI after placement of the gastroduodenal artery Ruby coils. This can be further investigated based  on manufacturer recommendation. If there may be a significant delay in ability to perform MRI, consider endoscopic ultrasound to investigate the pancreas. Spleen: Normal in size without focal abnormality. Adrenals/Urinary Tract: Adrenal glands are unremarkable. Kidneys are normal, without renal calculi, focal lesion, or hydronephrosis. Bladder is unremarkable. Stomach/Bowel: Gastric fold thickening and prominent fold thickening of the duodenum may be consistent with known peptic ulcer disease. No evidence of bowel obstruction or free intraperitoneal air. No focal abscess identified. Lymphatic: No enlarged lymph nodes identified. Reproductive: Prostate is unremarkable. Other: Diffuse anasarca of the body wall and edema of the mesenteric and retroperitoneal fat likely relate to low albumin and malnutrition. Musculoskeletal: Moderate degenerative disc disease at L5-S1. IMPRESSION: VASCULAR 1. 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 No complications evident after embolization. 2. Stable mild aneurysmal disease of the distal abdominal aorta measuring 3.1 cm. 3. 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 The major change since prior imaging is significant increase in mural thrombus within the dilated common iliac arteries bilaterally, especially on the right. This is most likely the source distal emboli to the right distal leg. Mural thrombus in the internal iliac artery aneurysms is stable to slightly more prominent. 4. Stable diffuse enlargement bilateral external iliac and common femoral arteries. The left common femoral artery does contain significant posterior wall mural thrombus. NON-VASCULAR 1. The most significant nonvascular finding is potential development of a mass of the pancreas at the juncture of the head and body causing progressive atrophy of the tail of the pancreas with associated pancreatic ductal dilatation. On the venous phase  of imaging, delineation of the potential mass is quite vague but the region of mass may measure as much as 3 cm. There may be some issue with immediate performance of MRI in the setting recent coiling of the gastroduodenal artery even if the coils are MRI compatible. This can be checked with the manufacturer recommendation. If MRI can be performed immediately, an MRI of the abdomen with and without gadolinium would be helpful for further evaluation. If there is going to be significant delay, consider EUS characterization of the pancreas. 2. Diffuse body wall anasarca and edema of the mesenteric fat and retroperitoneum likely reflects low albumin and malnutrition. 3. Prominent gastric folds and duodenal folds without evidence of bowel obstruction or perforation. Electronically Signed   By: Aletta Edouard M.D.   On: 09/11/2018 12:36     LOS: 15 days   Signature  Lala Lund M.D on 09/18/2018 at 10:40 AM   -  To page go to [email or web address] - password [facility]

## 2018-09-18 NOTE — Progress Notes (Addendum)
Pt had evening BG of 57. MD Candiss Norse notified and gave verbal order to cancel Lantus order and hold evening coverage. Writer gave pt juice and pudding. Will recheck BG shortly and continue to monitor.   1930: Gave report to Paramedic. Aware to recheck BG, as pt was off the floor earlier.

## 2018-09-18 NOTE — Progress Notes (Signed)
Patient ID: Ryan Medina, male   DOB: [birth year], 57 y.o.   MRN: [id number] Comfortable.  Reports assistant numbness in his foot which reports was present since his initial surgery.  Fasciotomy incisions healing well.

## 2018-09-18 NOTE — Progress Notes (Addendum)
CROSS,COVER [facility] Subjective: Patient seems to fairly stable today from a GI standpoint. He however consists to have black melenic stools. He denies having any abdominal pain nausea vomiting. According to his nurse he has been complaining of severe burning in his feet. He is tolerating his diet well..his hemoglobin hemoglobin is slightly down from 9.1 on 1220 628.8 yesterday and 8.6 today.  Objective: Vital signs in last 24 hours: Temp:  [99.2 F (37.3 C)-100 F (37.8 C)] 99.5 F (37.5 C) (12/28 0453) Pulse Rate:  [96-105] 101 (12/28 0453) Resp:  [12-18] 12 (12/28 0453) BP: (116-139)/(82-89) 116/82 (12/28 0453) SpO2:  [99 %-100 %] 99 % (12/28 0453) Last BM Date: 09/17/18  Intake/Output from previous day: 12/27 0701 - 12/28 0700 In: -  Out: 900 [Urine:900] Intake/Output this shift: No intake/output data recorded.  General appearance: alert, cooperative, appears stated age, fatigued and no distress Neck supple  Chest clear to auscultation S1-S2 regular  Abdomen soft non-tender with normal bowel sounds.  Lab Results: Recent Labs    09/16/18 0550 09/17/18 0358 09/18/18 0426  WBC 10.7* 13.3* 19.4*  HGB 9.1* 8.8* 8.6*  HCT 27.4* 25.9* 25.6*  PLT 290 354 382   BMET Recent Labs    09/16/18 0550 09/17/18 0358 09/18/18 0426  NA 134* 135 132*  K 3.8 3.4* 3.9  CL 102 101 99  CO2 23 25 25   GLUCOSE 138* 68* 101*  BUN 15 8 7   CREATININE 0.69 0.62 0.59*  CALCIUM 7.5* 7.5* 7.5*   LFT Recent Labs    09/18/18 0426  PROT 4.6*  ALBUMIN 1.6*  AST 19  ALT 24  ALKPHOS 69  BILITOT 0.6   Medications: I have reviewed the patient's current medications.  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 The final pathology is pending on the biopsies done during the EUS; . he is at high risk for  recurrent bleeding from the duodenal ulcer in spite of ongoing PPI therapy agree with palliative care     LOS: 15 days   Deanda Ruddell 09/18/2018, 9:10 AM

## 2018-09-19 LAB — CBC
HCT: 25.2 % — ABNORMAL LOW (ref 39.0–52.0)
Hemoglobin: 8.4 g/dL — ABNORMAL LOW (ref 13.0–17.0)
MCH: 28.5 pg (ref 26.0–34.0)
MCHC: 33.3 g/dL (ref 30.0–36.0)
MCV: 85.4 fL (ref 80.0–100.0)
Platelets: 390 10*3/uL (ref 150–400)
RBC: 2.95 MIL/uL — ABNORMAL LOW (ref 4.22–5.81)
RDW: 14.6 % (ref 11.5–15.5)
WBC: 14 10*3/uL — ABNORMAL HIGH (ref 4.0–10.5)
nRBC: 0 % (ref 0.0–0.2)

## 2018-09-19 LAB — COMPREHENSIVE METABOLIC PANEL
ALT: 22 U/L (ref 0–44)
ANION GAP: 9 (ref 5–15)
AST: 16 U/L (ref 15–41)
Albumin: 1.5 g/dL — ABNORMAL LOW (ref 3.5–5.0)
Alkaline Phosphatase: 70 U/L (ref 38–126)
BUN: 7 mg/dL (ref 6–20)
CO2: 26 mmol/L (ref 22–32)
Calcium: 7.6 mg/dL — ABNORMAL LOW (ref 8.9–10.3)
Chloride: 98 mmol/L (ref 98–111)
Creatinine, Ser: 0.64 mg/dL (ref 0.61–1.24)
GFR calc Af Amer: >60 mL/min (ref >60)
Glucose, Bld: 235 mg/dL — ABNORMAL HIGH (ref 70–99)
Potassium: 3.4 mmol/L — ABNORMAL LOW (ref 3.5–5.1)
Sodium: 133 mmol/L — ABNORMAL LOW (ref 135–145)
Total Bilirubin: 0.4 mg/dL (ref 0.3–1.2)
Total Protein: 4.8 g/dL — ABNORMAL LOW (ref 6.5–8.1)

## 2018-09-19 LAB — GLUCOSE, CAPILLARY
GLUCOSE-CAPILLARY: 285 mg/dL — AB (ref 70–99)
Glucose-Capillary: 147 mg/dL — ABNORMAL HIGH (ref 70–99)
Glucose-Capillary: 173 mg/dL — ABNORMAL HIGH (ref 70–99)
Glucose-Capillary: 103 mg/dL — ABNORMAL HIGH (ref 70–99)
Glucose-Capillary: 96 mg/dL (ref 70–99)

## 2018-09-19 MED ORDER — INSULIN GLARGINE 100 UNIT/ML ~~LOC~~ SOLN
7.0000 [IU] | Freq: Every day | SUBCUTANEOUS | Status: DC
  Administered 2018-09-19 – 2018-09-20 (×2): 7 [IU] via SUBCUTANEOUS
  Filled 2018-09-19 (×4): qty 0.07

## 2018-09-19 MED ORDER — INSULIN ASPART 100 UNIT/ML ~~LOC~~ SOLN
0.0000 [IU] | Freq: Every day | SUBCUTANEOUS | Status: DC
  Administered 2018-09-23 – 2018-09-26 (×2): 3 [IU] via SUBCUTANEOUS

## 2018-09-19 MED ORDER — INSULIN ASPART 100 UNIT/ML ~~LOC~~ SOLN
0.0000 [IU] | Freq: Three times a day (TID) | SUBCUTANEOUS | Status: DC
  Administered 2018-09-19: 2 [IU] via SUBCUTANEOUS
  Administered 2018-09-20 (×2): 5 [IU] via SUBCUTANEOUS
  Administered 2018-09-20 – 2018-09-21 (×2): 2 [IU] via SUBCUTANEOUS
  Administered 2018-09-21 (×2): 3 [IU] via SUBCUTANEOUS
  Administered 2018-09-22: 5 [IU] via SUBCUTANEOUS
  Administered 2018-09-22: 3 [IU] via SUBCUTANEOUS
  Administered 2018-09-22: 5 [IU] via SUBCUTANEOUS
  Administered 2018-09-23: 2 [IU] via SUBCUTANEOUS
  Administered 2018-09-23: 3 [IU] via SUBCUTANEOUS
  Administered 2018-09-23: 5 [IU] via SUBCUTANEOUS
  Administered 2018-09-24: 2 [IU] via SUBCUTANEOUS
  Administered 2018-09-24: 3 [IU] via SUBCUTANEOUS
  Administered 2018-09-24: 2 [IU] via SUBCUTANEOUS
  Administered 2018-09-25: 3 [IU] via SUBCUTANEOUS
  Administered 2018-09-26: 1 [IU] via SUBCUTANEOUS
  Administered 2018-09-27: 7 [IU] via SUBCUTANEOUS

## 2018-09-19 MED ORDER — POTASSIUM CHLORIDE CRYS ER 20 MEQ PO TBCR
40.0000 meq | EXTENDED_RELEASE_TABLET | Freq: Once | ORAL | Status: AC
  Administered 2018-09-19: 40 meq via ORAL
  Filled 2018-09-19: qty 2

## 2018-09-19 MED ORDER — MAGNESIUM SULFATE IN D5W 1-5 GM/100ML-% IV SOLN
1.0000 g | Freq: Once | INTRAVENOUS | Status: AC
  Administered 2018-09-19: 1 g via INTRAVENOUS
  Filled 2018-09-19: qty 100

## 2018-09-19 NOTE — Progress Notes (Addendum)
PROGRESS NOTE        PATIENT DETAILS Name: Ryan Medina Age: 57 y.o. Sex: male Date of Birth: 06-Oct-1960 Admit Date: 09/03/2018 Admitting Physician [name], MD [id number], No Pcp Per  Brief Narrative: Patient is a 57 y.o. male with history of cocaine use, chronic systolic heart failure presented to the hospital on 12/13 with a right ischemic leg secondary to acute popliteal and tibial embolus, underwent embolectomy on 12/13 and subsequently was placed on a heparin drip.  He unfortunately developed a acute right lower leg hematoma with compartment syndrome requiring fasciotomy on 12/15.  Further hospital course was complicated by development of hemorrhagic shock with acute blood loss anemia secondary to a bleeding duodenal ulcer-requiring ICU transfer-intubation for airway protection and IR evaluation with mesenteric angiogram and GDA embolization.  Upon stability he was transferred back to the triad hospitalist service on 12/19.  See below for further details.  Subjective:  Patient in bed, appears comfortable, denies any headache, no fever, no chest pain or pressure, no shortness of breath , no abdominal pain. No focal weakness.    Assessment/Plan:  Right ischemic leg secondary to popliteal artery/tibial artery embolism-s/p embolectomy on 77/93 complicated by right lower extremity hematoma with compartment syndrome requiring fasciotomy on 12/15:   Source of embolization unclear, required fasciotomy on 09/05/2018 by vascular surgery status post closure of fasciotomy on 09/10/2018.  His course was complicated by large hematoma in the right leg along with significant life-threatening GI bleed. Case discussed with vascular surgeon Dr. Monica Martinez who wants to continue anticoagulation as much as possible.   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  Kindly see anticoagulation below.  Cardiology for TEE has been  consulted as well.  Await report.  Upper GI bleeding with hemorrhagic shock and acute blood loss anemia: Developed hemorrhagic shock, was seen by GI, required multiple PRBC transfusions last transfusion on 09/09/2018.  Was seen by GI and eventually underwent gastroduodenal artery embolization on 09/07/2018.  Total 8 units of packed RBC, 1 unit of FFP and 1 unit of platelets transfused around 09/08/2018. GI note noted from 09/10/2018 (okay with heparin challenge).  He was rechallenged with heparin cautiously on 09/12/2018 unfortunately he bled again on 09/14/2018 after which heparin was stopped and he received another 3 units of packed RBCs.  So far he has received 11 units of packed RBCs.  He then underwent EUS on 09/16/2018 which showed +ve Duodenal Ulcer which was still raw. Continue PPI and Hold Heparin indefinitely.    Hypokalemia and hypomagnesemia.  Replaced and now stable.    Urinary retention.  On Foley and Flomax.  Foley removed on 09/15/2018.     Acute hypoxic respiratory failure: Intubated when he developed hemorrhagic shock secondary to GI bleeding, successfully extubated-currently stable on 2 L of oxygen via nasal cannula.  Chronic systolic heart failure (EF 45-50% by TTE on 09/04/2018): Reasonably well compensated-follow weights, volume status, electrolytes closely.  Cocaine use/tobacco use: Counseled-I am still not sure if he has any intention of quitting.  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  MRI noted & confirms mass, CA 19.9 was elevated, GI seeing and underwent EUS with biopsy on 09/16/2018, hep C consistent with adenocarcinoma.  Will consult oncology Monday.  Nonspecific worsening leukocytosis.  Unclear etiology.  Repeat two-view chest x-ray, check a UA and monitor.  Postop right leg wound site appears stable.  Could be stress-induced.    Insulin-dependent DM-2: Last A1c on 12/2 was 18.2.  Labile  sugars with episodes of hypoglycemia, have adjusted Lantus dose and sliding scale on 09/19/2018.  We will continue to monitor closely.  CBG (last 3)  Recent Labs    09/18/18 1752 09/18/18 2042 09/19/18 0800  GLUCAP 57* 79 285*      DVT Prophylaxis: Heparin drip had to be stopped on 09/15/2018 due to GI bleed, will place SCDs in the left leg.  Code Status: Full code   Family Communication: Wife at bedside 12/25 & 12/26  Disposition Plan: Tele  Antimicrobial agents: Anti-infectives (From admission, onward)   Start     Dose/Rate Route Frequency Ordered Stop   09/10/18 2130  ceFAZolin (ANCEF) IVPB 2g/100 mL premix     2 g 200 mL/hr over 30 Minutes Intravenous Every 8 hours 09/10/18 1634 09/11/18 0700   09/10/18 0800  ceFAZolin (ANCEF) IVPB 1 g/50 mL premix    Note to Pharmacy:  Send with pt to OR   1 g 100 mL/hr over 30 Minutes Intravenous On call 09/09/18 0756 09/10/18 1407   09/07/18 1300  erythromycin 250 mg in sodium chloride 0.9 % 100 mL IVPB     250 mg 100 mL/hr over 60 Minutes Intravenous Once 09/07/18 1125 09/07/18 2351   09/06/18 0000  ceFAZolin (ANCEF) IVPB 1 g/50 mL premix    Note to Pharmacy:  Send with pt to OR   1 g 100 mL/hr over 30 Minutes Intravenous On call 09/05/18 0849 09/05/18 0952   09/03/18 2245  ceFAZolin (ANCEF) IVPB 2g/100 mL premix     2 g 200 mL/hr over 30 Minutes Intravenous Every 8 hours 09/03/18 2236 09/04/18 1444   09/03/18 1615  ceFAZolin (ANCEF) IVPB 2g/100 mL premix  Status:  Discontinued     2 g 200 mL/hr over 30 Minutes Intravenous On call to O.R. 09/03/18 1600 09/03/18 2035      Procedures:  12/17>>Mesenteric angiogram and coil embolization of the GDA  12/17>>EGD  12/15>> 1.  Evacuation hematoma right leg 2.  Evacuation of lymphocele right leg 3.  4 compartment fasciotomy 4.  Placement of VAC (medial and lateral)  12/13>> 1.  Ultrasound-guided access to the left common femoral artery 2.  Aortogram with bilateral iliac  arteriogram 3.  Selective catheterization of the right external iliac artery with right lower extremity runoff  MRI Abd.  Pancreatic mass.   TEE -   EUS  Impression:  - 3.6cm by 2.1cm mass in the neck of pancreas that surrounds the celiac trunk, obstructs the main pancreatic duct and appears to have also invaded the duodenal bulb created a large, malignant, friable ulcer. I biopsied the abnormal, firm mucosa surrounding the duodenal ulcer and sampled the pancreatic mass with transgastric EUS FNA. Preliminary cytology review from the pancreatic mass was positive for malignancy.   Recommendation   - Return patient to hospital [facility] for ongoing care. - He has recieved 13 units of blood this admission, 3 of them in the past 24 hours. He will continue to ooze from the duodenal ulcer that I suspect is malignant, related to the nearby large pancreatic mass. Judicious use of bloodthinners is strongly recommended. - Await final cytology/pathology reports; both hopefully available tomorrow.        CONSULTS:  pulmonary/intensive care, vascular surgery and IR, GI, Cards  Time spent: 40  minutes-Greater than 50% of this time was spent in counseling, explanation of diagnosis, planning of further management, and coordination of care.  MEDICATIONS: Scheduled Meds: . atorvastatin  20 mg Oral Daily  . chlorhexidine gluconate (MEDLINE KIT)  15 mL Mouth Rinse BID  . insulin aspart  0-9 Units Subcutaneous TID WC  . lidocaine  15 mL Oral Once  . mouth rinse  15 mL Mouth Rinse BID  . pantoprazole (PROTONIX) IV  40 mg Intravenous Q12H  . tamsulosin  0.4 mg Oral Daily   Continuous Infusions: . sodium chloride    . magnesium sulfate 1 - 4 g bolus IVPB 1 g (09/19/18 1009)   PRN Meds:.sodium chloride, alum & mag hydroxide-simeth, famotidine, ipratropium-albuterol, LORazepam, morphine injection, [DISCONTINUED] ondansetron **OR** ondansetron (ZOFRAN) IV, oxyCODONE-acetaminophen, polyethylene glycol,  senna-docusate, sodium chloride flush   PHYSICAL EXAM: Vital signs: Vitals:   09/18/18 0453 09/18/18 1441 09/18/18 2044 09/19/18 0440  BP: 116/82 (!) 105/91 111/80 125/87  Pulse: (!) 101 87 86 89  Resp: 12 16 16 16   Temp: 99.5 F (37.5 C) 98.7 F (37.1 C) 98.5 F (36.9 C) 98.2 F (36.8 C)  TempSrc: Oral Oral Oral Oral  SpO2: 99% 100% 100% 100%  Weight:      Height:       Filed Weights   09/03/18 1650 09/16/18 1325  Weight: 66.7 kg 66.7 kg   Body mass index is 20.51 kg/m.   Exam  Awake Alert, Oriented X 3, No new F.N deficits, Normal affect [place].AT,PERRAL Supple Neck,No JVD, No cervical lymphadenopathy appriciated.  Symmetrical Chest wall movement, Good air movement bilaterally, CTAB RRR,No Gallops, Rubs or new Murmurs, No Parasternal Heave +ve B.Sounds, Abd Soft, No tenderness, No organomegaly appriciated, No rebound - guarding or rigidity. No Cyanosis, Clubbing or edema, No new Rash or bruise   I have personally reviewed following labs and imaging studies  LABORATORY DATA: CBC: Recent Labs  Lab 09/15/18 0427  09/15/18 2016 09/16/18 0550 09/17/18 0358 09/18/18 0426 09/19/18 0356  WBC 12.0*  --   --  10.7* 13.3* 19.4* 14.0*  HGB 7.3*   < > 6.5* 9.1* 8.8* 8.6* 8.4*  HCT 22.8*   < > 20.1* 27.4* 25.9* 25.6* 25.2*  MCV 85.4  --   --  83.5 84.4 84.5 85.4  PLT 330  --   --  290 354 382 390   < > = values in this interval not displayed.    Basic Metabolic Panel: Recent Labs  Lab 09/13/18 0352  09/15/18 0427 09/16/18 0550 09/17/18 0358 09/18/18 0426 09/19/18 0356  NA 138   < > 135 134* 135 132* 133*  K 3.4*   < > 3.7 3.8 3.4* 3.9 3.4*  CL 100   < > 96* 102 101 99 98  CO2 26   < > 30 23 25 25 26   GLUCOSE 254*   < > 311* 138* 68* 101* 235*  BUN 6   < > 29* 15 8 7 7   CREATININE 0.49*   < > 0.74 0.69 0.62 0.59* 0.64  CALCIUM 7.4*   < > 7.4* 7.5* 7.5* 7.5* 7.6*  MG 1.7  --   --  1.7 1.6* 1.8  --    < > = values in this interval not displayed.     GFR: Estimated Creatinine Clearance: 96.1 mL/min (by C-G formula based on SCr of 0.64 mg/dL).  Liver Function Tests: Recent Labs  Lab 09/15/18 9826 09/16/18 0550 09/17/18 0358 09/18/18 0426 09/19/18 0356  AST 38 33 28 19 16   ALT 31 30 28 24 22   ALKPHOS 53 49 51 69 70  BILITOT 0.7 1.0 1.0 0.6 0.4  PROT 4.6* 4.4* 4.4* 4.6* 4.8*  ALBUMIN 1.6* 1.6* 1.5* 1.6* 1.5*   No results for input(s): LIPASE, AMYLASE in the last 168 hours. No results for input(s): AMMONIA in the last 168 hours.  Coagulation Profile: No results for input(s): INR, PROTIME in the last 168 hours.  Cardiac Enzymes: No results for input(s): CKTOTAL, CKMB, CKMBINDEX, TROPONINI in the last 168 hours.  BNP (last 3 results) No results for input(s): PROBNP in the last 8760 hours.  HbA1C: No results for input(s): HGBA1C in the last 72 hours.  CBG: Recent Labs  Lab 09/18/18 0930 09/18/18 1300 09/18/18 1752 09/18/18 2042 09/19/18 0800  GLUCAP 113* 89 57* 79 285*    Lipid Profile: No results for input(s): CHOL, HDL, LDLCALC, TRIG, CHOLHDL, LDLDIRECT in the last 72 hours.  Thyroid Function Tests: No results for input(s): TSH, T4TOTAL, FREET4, T3FREE, THYROIDAB in the last 72 hours.  Anemia Panel: No results for input(s): VITAMINB12, FOLATE, FERRITIN, TIBC, IRON, RETICCTPCT in the last 72 hours.  Urine analysis:    Component Value Date/Time   COLORURINE YELLOW 09/18/2018 1152   APPEARANCEUR HAZY (A) 09/18/2018 1152   LABSPEC 1.011 09/18/2018 1152   PHURINE 6.0 09/18/2018 1152   GLUCOSEU NEGATIVE 09/18/2018 1152   HGBUR NEGATIVE 09/18/2018 1152   BILIRUBINUR NEGATIVE 09/18/2018 1152   KETONESUR NEGATIVE 09/18/2018 1152   PROTEINUR NEGATIVE 09/18/2018 1152   UROBILINOGEN 4.0 (H) 12/05/2016 0921   NITRITE NEGATIVE 09/18/2018 1152   LEUKOCYTESUR SMALL (A) 09/18/2018 1152    Sepsis Labs: Lactic Acid, Venous    Component Value Date/Time   LATICACIDVEN 1.7 09/07/2018 1600     MICROBIOLOGY: Recent Results (from the past 240 hour(s))  Surgical pcr screen     Status: None   Collection Time: 09/10/18  3:39 AM  Result Value Ref Range Status   MRSA, PCR NEGATIVE NEGATIVE Final   Staphylococcus aureus NEGATIVE NEGATIVE Final    Comment: (NOTE) The Xpert SA Assay (FDA approved for NASAL specimens in patients 49 years of age and older), is one component of a comprehensive surveillance program. It is not intended to diagnose infection nor to guide or monitor treatment. Performed at [facility], [address] [place], [place] [address]     RADIOLOGY STUDIES/RESULTS: Dg Chest 2 View  Result Date: 09/18/2018 CLINICAL DATA:  57 year old former smoker who had a fever and shortness of breath yesterday. Breathing has improved significantly after nebulizer treatment. Follow-up pneumonia. EXAM: CHEST - 2 VIEW COMPARISON:  09/09/2018 and earlier, including CT chest 09/06/2018. FINDINGS: Cardiac silhouette normal in size, unchanged. Thoracic aorta mildly tortuous, unchanged. Hilar and mediastinal contours otherwise unremarkable. Mild emphysematous changes in both lungs as noted on the recent CT. Linear atelectasis or developing scar in the LEFT LOWER LOBE at the site of the recent pneumonia. Lungs otherwise clear. No localized airspace consolidation. No pleural effusions. No pneumothorax. Normal pulmonary vascularity. Visualized bony thorax intact. IMPRESSION: COPD/emphysema. Linear atelectasis or developing scar in the LEFT LOWER LOBE at the site of the recent pneumonia. No acute cardiopulmonary disease otherwise. Electronically Signed   By: Evangeline Dakin M.D.   On: 09/18/2018 12:41   Dg Abd 1 View  Result Date: 09/07/2018 CLINICAL DATA:  OG tube placement. EXAM: ABDOMEN - 1 VIEW COMPARISON:  CT abdomen pelvis 08/22/2018. FINDINGS: OG tube is in place with  the side port and tip in the body of the stomach. The stomach is distended. IMPRESSION: OG tube in good  position. Electronically Signed   By: Inge Rise M.D.   On: 09/07/2018 13:31   Ct Angio Chest Pe W Or Wo Contrast  Result Date: 09/06/2018 CLINICAL DATA:  Evaluate thoracic aortic aneurysm. EXAM: CT ANGIOGRAPHY CHEST WITH CONTRAST TECHNIQUE: Multidetector CT imaging of the chest was performed using the standard protocol during bolus administration of intravenous contrast. Multiplanar CT image reconstructions and MIPs were obtained to evaluate the vascular anatomy. CONTRAST:  65 mL ISOVUE-370 IOPAMIDOL (ISOVUE-370) INJECTION 76% COMPARISON:  01/30/2018; 10/14/2016; 04/25/2026 FINDINGS: Vascular Findings: No evidence of thoracic aortic aneurysm with measurements as follows. No definite evidence of thoracic aortic dissection or periaortic stranding on this nongated examination. Bovine configuration of the aortic arch. The branch vessels of the aortic arch appear widely patent throughout their imaged courses. The descending thoracic aorta is of normal caliber and widely patent without hemodynamically significant stenosis. There is a very minimal amount of atherosclerotic plaque involving the proximal aspect of the descending thoracic aorta. Cardiomegaly.  No pericardial effusion. Although this examination was not tailored for the evaluation the pulmonary arteries, there are no discrete filling defects within the central pulmonary arterial tree to suggest central pulmonary embolism. Borderline enlarged caliber the main pulmonary artery measuring 32 mm in diameter (image 67, series 6) ------------------------------------------------------------- Thoracic aortic measurements: Sinotubular junction 33 mm as measured in greatest oblique coronal dimension. Proximal ascending aorta 35 mm as measured in greatest oblique axial dimension at the level of the main pulmonary artery (image 70, series 6); and approximately 36 mm in greatest oblique short axis coronal diameter (coronal image 48, series 9). Aortic arch aorta  28 mm as measured in greatest oblique sagittal dimension. Proximal descending thoracic aorta 30 mm as measured in greatest oblique axial dimension at the level of the main pulmonary artery. Distal descending thoracic aorta 29 mm as measured in greatest oblique axial dimension at the level of the diaphragmatic hiatus. Review of the MIP images confirms the above findings. ------------------------------------------------------------- Non-Vascular Findings: Mediastinum/Lymph Nodes: Prominent right hilar lymph nodes are not enlarged by size criteria with index right suprahilar lymph node measuring 0.8 cm in greatest short axis diameter (image 71, series 6) and index right infrahilar lymph node measuring 0.7 cm (image 88). No definitive bulky mediastinal, hilar or axillary lymphadenopathy. Lungs/Pleura: Interval development of small bilateral pleural effusions with worsening bibasilar consolidative opacities and associated air bronchograms, right greater than left. Interval development of an approximately 1.0 x 0.5 cm subpleural consolidative opacity within the right upper lobe (image 36, series 7). Unchanged punctate (approximately 3 mm) calcified granuloma within the left lower lobe (image 75, series 3). Evaluation for additional pulmonary nodules is degraded secondary to bibasilar airspace opacities. The central pulmonary airways appear widely patent. Upper abdomen: Fluid distention of the mid and distal aspects of the esophagus with marked distension of the imaged superior aspect of the stomach. Musculoskeletal: No acute or aggressive osseous abnormalities. There is incomplete fusion involving the spinous processes of multiple thoracic vertebral bodies, unchanged. Regional soft tissues appear normal. Normal appearance of the thyroid gland. IMPRESSION: 1. No evidence of thoracic aortic aneurysm or dissection. 2. Small bilateral effusions with associated bibasilar consolidative opacities with associated air  bronchograms, atelectasis versus infiltrate. Follow-up chest radiograph in 4-6 weeks after treatment is recommended to ensure resolution. 3. Fluid distension of the mid and distal aspects of the esophagus as well as marked distension  of the imaged superior aspect stomach - correlation for gastric outlet obstructive symptoms is advised. 4. Cardiomegaly with enlargement of the caliber the main pulmonary artery as could be seen in the setting of pulmonary arterial hypertension. Electronically Signed   By: Sandi Mariscal M.D.   On: 09/06/2018 09:20   Mr Abdomen W Wo Contrast  Result Date: 09/12/2018 CLINICAL DATA:  Suspicion of pancreatic neck mass on CT. EXAM: MRI ABDOMEN WITHOUT AND WITH CONTRAST TECHNIQUE: Multiplanar multisequence MR imaging of the abdomen was performed both before and after the administration of intravenous contrast. CONTRAST:  6 cc Gadavist COMPARISON:  CT of 1 day prior. Stone study of 08/22/2018. More remote CT of 12/19/2017. FINDINGS: Moderatemultifactorial degradation, including respiratory motion and susceptibility artifact from embolization coils. Lower chest: Bilateral pleural fluid with bibasilar airspace disease. Normal heart size. Hepatobiliary: Left hepatic lobe 9 mm cyst. Too small to characterizea right hepatic lobe lesion is most likely a cyst at 2 mm. Normal gallbladder, without biliary ductal dilatation. Pancreas: An area of soft tissue fullness and restricted diffusion is identified within the pancreatic neck, corresponding to the abnormality on CT. This measures on the order of 3.9 x 3.6 cm on image 76/12. Also 3.6 x 3.6 cm on image 35/31. Results in pancreatic duct dilatation within the upstream body and tail on image 17/11. Involves the celiac and its branches including on image 75/12. Spleen:  Grossly within normal limits. Adrenals/Urinary Tract: Normal adrenal glands. Grossly normal kidneys, without hydronephrosis. Stomach/Bowel: Gastric distension with proximal wall and  fold thickening, including on image 73/12. Grossly normal abdominal bowel loops. Vascular/Lymphatic: Normal caliber of the aorta. 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 Limited evaluation for abdominal adenopathy. Other:  Small volume abdominal ascites. Musculoskeletal: No acute osseous abnormality. IMPRESSION: 1. Moderate motion and susceptibility artifact degradation throughout. 2. Confirmation of a pancreatic neck mass, most consistent with adenocarcinoma. Involvement of the celiac and its branches, most consistent with a non-operative lesion. 3. Consider endoscopic ultrasound sampling. 4. Gastric wall and fold thickening, suggesting gastritis. 5. Small bilateral pleural effusions with adjacent airspace disease. Electronically Signed   By: Abigail Miyamoto M.D.   On: 09/12/2018 03:04   Ir Angiogram Visceral Selective  Result Date: 09/07/2018 INDICATION: 57 year old male with large volume upper GI bleed secondary to a very large duodenal ulcer. Patient underwent endoscopic intervention but continues to require pressors and large volume transfusion. He presents for mesenteric arteriography and embolization of any active bleeding and prophylactic embolization of the gastroduodenal artery. EXAM: IR ULTRASOUND GUIDANCE VASC ACCESS RIGHT; ADDITIONAL ARTERIOGRAPHY; IR EMBO ART VEN HEMORR LYMPH EXTRAV INC GUIDE ROADMAPPING; SELECTIVE VISCERAL ARTERIOGRAPHY 1. Ultrasound-guided vascular access 2. Catheterization of the left gastric artery where it arises directly from the aorta with arteriogram 3. Catheterization of the celiac artery with arteriogram 4. Catheterization of the common hepatic artery with arteriogram 5. Catheterization of the gastroduodenal artery with arteriogram 6. Coil embolization of the gastroduodenal artery 7. Catheterization of the superior mesenteric artery with arteriogram MEDICATIONS: None ANESTHESIA/SEDATION: Patient intubated and on propofol drip CONTRAST:  70 mL Isovue 370  FLUOROSCOPY TIME:  Fluoroscopy Time: 11 minutes 30 seconds (487 mGy). COMPLICATIONS: None immediate. PROCEDURE: Informed consent was obtained from the patient following explanation of the procedure, risks, benefits and alternatives. The patient understands, agrees and consents for the procedure. All questions were addressed. A time out was performed prior to the initiation of the procedure. Maximal barrier sterile technique utilized including caps, mask, sterile gowns, sterile gloves, large sterile drape, hand  hygiene, and Betadine prep. The right common femoral artery was interrogated with ultrasound and found to be widely patent. An image was obtained and stored for the medical record. Local anesthesia was attained by infiltration with 1% lidocaine. A small dermatotomy was made. Under real-time sonographic guidance, the vessel was punctured with a 21 gauge micropuncture needle. Using standard technique, the initial micro needle was exchanged over a 0.018 micro wire for a transitional 4 [place] micro sheath. The micro sheath was then exchanged over a 0.035 wire for a 5 French vascular sheath. A C2 cobra catheter was advanced in the abdominal aorta over a Bentson wire. The catheter was used to select the first vessel arising from the ventral aorta. Contrast was injected. This is a left gastric artery arising directly from the aorta. Anatomy is normal. No evidence of active bleeding. The Cobra catheter was then used to select the origin of the celiac artery. Arteriography was performed. The splenic artery is small in caliber. The hepatic artery demonstrates normal hepatic arterial anatomy. The gastroduodenal artery is slightly irregular with spasm in the proximal segment. No evidence of active extravasation. A glidewire was successfully advanced into the common hepatic artery and the C2 cobra catheter was advanced into the common hepatic artery. Additional arteriography was performed in multiple obliquities. Again,  the proximal gastroduodenal artery is irregular and spasmodic. No evidence of dissection, aneurysm or active bleeding. A Lantern microcatheter was then advanced over a Fathom 16 wire and used to select the distal aspect of the gastroduodenal artery. Arteriography was performed. The gastroepiploic arteries are widely patent. No evidence of active bleeding. Coil embolization was then performed using a 4 mm Ruby POD detachable micro coil followed by a 45 cm Ruby packing coil. Post embolization arteriography demonstrates complete occlusion of the vessel with stasis in the proximal segment. The Lantern microcatheter was brought back into the common hepatic artery and arteriography was performed. Again, complete occlusion of the gastroduodenal artery. The right gastric artery is visualized. No evidence of active bleeding from the right gastric artery. The microcatheter was removed. The C2 cobra catheter was used to select the superior mesenteric artery. Arteriography was performed. Patent pancreaticoduodenal arcade supplying the gastroepiploic artery. No evidence of active hemorrhage. The C2 cobra catheter was removed. A limited right common femoral arteriogram was performed confirming common femoral arterial access. Hemostasis was attained with the assistance of a Cordis ExoSeal extra arterial vascular plug. IMPRESSION: 1. No evidence of active hemorrhage. 2. 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 3. Prophylactic coil embolization of the gastroduodenal artery. Signed, Criselda Peaches, MD, [place] [facility] Radiology Electronically Signed   By: Jacqulynn Cadet M.D.   On: 09/07/2018 22:32   Ir Angiogram Visceral Selective  Result Date: 09/07/2018 INDICATION: 57 year old male with large volume upper GI bleed secondary to a very large duodenal ulcer. Patient underwent endoscopic intervention but continues to require pressors and large volume  transfusion. He presents for mesenteric arteriography and embolization of any active bleeding and prophylactic embolization of the gastroduodenal artery. EXAM: IR ULTRASOUND GUIDANCE VASC ACCESS RIGHT; ADDITIONAL ARTERIOGRAPHY; IR EMBO ART VEN HEMORR LYMPH EXTRAV INC GUIDE ROADMAPPING; SELECTIVE VISCERAL ARTERIOGRAPHY 1. Ultrasound-guided vascular access 2. Catheterization of the left gastric artery where it arises directly from the aorta with arteriogram 3. Catheterization of the celiac artery with arteriogram 4. Catheterization of the common hepatic artery with arteriogram 5. Catheterization of the gastroduodenal artery with arteriogram 6. Coil embolization of the gastroduodenal artery 7. Catheterization of  the superior mesenteric artery with arteriogram MEDICATIONS: None ANESTHESIA/SEDATION: Patient intubated and on propofol drip CONTRAST:  70 mL Isovue 370 FLUOROSCOPY TIME:  Fluoroscopy Time: 11 minutes 30 seconds (487 mGy). COMPLICATIONS: None immediate. PROCEDURE: Informed consent was obtained from the patient following explanation of the procedure, risks, benefits and alternatives. The patient understands, agrees and consents for the procedure. All questions were addressed. A time out was performed prior to the initiation of the procedure. Maximal barrier sterile technique utilized including caps, mask, sterile gowns, sterile gloves, large sterile drape, hand hygiene, and Betadine prep. The right common femoral artery was interrogated with ultrasound and found to be widely patent. An image was obtained and stored for the medical record. Local anesthesia was attained by infiltration with 1% lidocaine. A small dermatotomy was made. Under real-time sonographic guidance, the vessel was punctured with a 21 gauge micropuncture needle. Using standard technique, the initial micro needle was exchanged over a 0.018 micro wire for a transitional 4 [place] micro sheath. The micro sheath was then exchanged over a 0.035  wire for a 5 French vascular sheath. A C2 cobra catheter was advanced in the abdominal aorta over a Bentson wire. The catheter was used to select the first vessel arising from the ventral aorta. Contrast was injected. This is a left gastric artery arising directly from the aorta. Anatomy is normal. No evidence of active bleeding. The Cobra catheter was then used to select the origin of the celiac artery. Arteriography was performed. The splenic artery is small in caliber. The hepatic artery demonstrates normal hepatic arterial anatomy. The gastroduodenal artery is slightly irregular with spasm in the proximal segment. No evidence of active extravasation. A glidewire was successfully advanced into the common hepatic artery and the C2 cobra catheter was advanced into the common hepatic artery. Additional arteriography was performed in multiple obliquities. Again, the proximal gastroduodenal artery is irregular and spasmodic. No evidence of dissection, aneurysm or active bleeding. A Lantern microcatheter was then advanced over a Fathom 16 wire and used to select the distal aspect of the gastroduodenal artery. Arteriography was performed. The gastroepiploic arteries are widely patent. No evidence of active bleeding. Coil embolization was then performed using a 4 mm Ruby POD detachable micro coil followed by a 45 cm Ruby packing coil. Post embolization arteriography demonstrates complete occlusion of the vessel with stasis in the proximal segment. The Lantern microcatheter was brought back into the common hepatic artery and arteriography was performed. Again, complete occlusion of the gastroduodenal artery. The right gastric artery is visualized. No evidence of active bleeding from the right gastric artery. The microcatheter was removed. The C2 cobra catheter was used to select the superior mesenteric artery. Arteriography was performed. Patent pancreaticoduodenal arcade supplying the gastroepiploic artery. No evidence  of active hemorrhage. The C2 cobra catheter was removed. A limited right common femoral arteriogram was performed confirming common femoral arterial access. Hemostasis was attained with the assistance of a Cordis ExoSeal extra arterial vascular plug. IMPRESSION: 1. No evidence of active hemorrhage. 2. 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 3. Prophylactic coil embolization of the gastroduodenal artery. Signed, Criselda Peaches, MD, [place] [facility] Radiology Electronically Signed   By: Jacqulynn Cadet M.D.   On: 09/07/2018 22:32   Ir Angiogram Selective Each Additional Vessel  Result Date: 09/07/2018 INDICATION: 57 year old male with large volume upper GI bleed secondary to a very large duodenal ulcer. Patient underwent endoscopic intervention but continues to require pressors and large volume transfusion.  He presents for mesenteric arteriography and embolization of any active bleeding and prophylactic embolization of the gastroduodenal artery. EXAM: IR ULTRASOUND GUIDANCE VASC ACCESS RIGHT; ADDITIONAL ARTERIOGRAPHY; IR EMBO ART VEN HEMORR LYMPH EXTRAV INC GUIDE ROADMAPPING; SELECTIVE VISCERAL ARTERIOGRAPHY 1. Ultrasound-guided vascular access 2. Catheterization of the left gastric artery where it arises directly from the aorta with arteriogram 3. Catheterization of the celiac artery with arteriogram 4. Catheterization of the common hepatic artery with arteriogram 5. Catheterization of the gastroduodenal artery with arteriogram 6. Coil embolization of the gastroduodenal artery 7. Catheterization of the superior mesenteric artery with arteriogram MEDICATIONS: None ANESTHESIA/SEDATION: Patient intubated and on propofol drip CONTRAST:  70 mL Isovue 370 FLUOROSCOPY TIME:  Fluoroscopy Time: 11 minutes 30 seconds (487 mGy). COMPLICATIONS: None immediate. PROCEDURE: Informed consent was obtained from the patient following explanation of  the procedure, risks, benefits and alternatives. The patient understands, agrees and consents for the procedure. All questions were addressed. A time out was performed prior to the initiation of the procedure. Maximal barrier sterile technique utilized including caps, mask, sterile gowns, sterile gloves, large sterile drape, hand hygiene, and Betadine prep. The right common femoral artery was interrogated with ultrasound and found to be widely patent. An image was obtained and stored for the medical record. Local anesthesia was attained by infiltration with 1% lidocaine. A small dermatotomy was made. Under real-time sonographic guidance, the vessel was punctured with a 21 gauge micropuncture needle. Using standard technique, the initial micro needle was exchanged over a 0.018 micro wire for a transitional 4 [place] micro sheath. The micro sheath was then exchanged over a 0.035 wire for a 5 French vascular sheath. A C2 cobra catheter was advanced in the abdominal aorta over a Bentson wire. The catheter was used to select the first vessel arising from the ventral aorta. Contrast was injected. This is a left gastric artery arising directly from the aorta. Anatomy is normal. No evidence of active bleeding. The Cobra catheter was then used to select the origin of the celiac artery. Arteriography was performed. The splenic artery is small in caliber. The hepatic artery demonstrates normal hepatic arterial anatomy. The gastroduodenal artery is slightly irregular with spasm in the proximal segment. No evidence of active extravasation. A glidewire was successfully advanced into the common hepatic artery and the C2 cobra catheter was advanced into the common hepatic artery. Additional arteriography was performed in multiple obliquities. Again, the proximal gastroduodenal artery is irregular and spasmodic. No evidence of dissection, aneurysm or active bleeding. A Lantern microcatheter was then advanced over a Fathom 16 wire and  used to select the distal aspect of the gastroduodenal artery. Arteriography was performed. The gastroepiploic arteries are widely patent. No evidence of active bleeding. Coil embolization was then performed using a 4 mm Ruby POD detachable micro coil followed by a 45 cm Ruby packing coil. Post embolization arteriography demonstrates complete occlusion of the vessel with stasis in the proximal segment. The Lantern microcatheter was brought back into the common hepatic artery and arteriography was performed. Again, complete occlusion of the gastroduodenal artery. The right gastric artery is visualized. No evidence of active bleeding from the right gastric artery. The microcatheter was removed. The C2 cobra catheter was used to select the superior mesenteric artery. Arteriography was performed. Patent pancreaticoduodenal arcade supplying the gastroepiploic artery. No evidence of active hemorrhage. The C2 cobra catheter was removed. A limited right common femoral arteriogram was performed confirming common femoral arterial access. Hemostasis was attained with the assistance of a Cordis ExoSeal  extra arterial vascular plug. IMPRESSION: 1. No evidence of active hemorrhage. 2. 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 3. Prophylactic coil embolization of the gastroduodenal artery. Signed, Criselda Peaches, MD, [place] [facility] Radiology Electronically Signed   By: Jacqulynn Cadet M.D.   On: 09/07/2018 22:32   Ir Angiogram Selective Each Additional Vessel  Result Date: 09/07/2018 INDICATION: 57 year old male with large volume upper GI bleed secondary to a very large duodenal ulcer. Patient underwent endoscopic intervention but continues to require pressors and large volume transfusion. He presents for mesenteric arteriography and embolization of any active bleeding and prophylactic embolization of the gastroduodenal artery. EXAM: IR  ULTRASOUND GUIDANCE VASC ACCESS RIGHT; ADDITIONAL ARTERIOGRAPHY; IR EMBO ART VEN HEMORR LYMPH EXTRAV INC GUIDE ROADMAPPING; SELECTIVE VISCERAL ARTERIOGRAPHY 1. Ultrasound-guided vascular access 2. Catheterization of the left gastric artery where it arises directly from the aorta with arteriogram 3. Catheterization of the celiac artery with arteriogram 4. Catheterization of the common hepatic artery with arteriogram 5. Catheterization of the gastroduodenal artery with arteriogram 6. Coil embolization of the gastroduodenal artery 7. Catheterization of the superior mesenteric artery with arteriogram MEDICATIONS: None ANESTHESIA/SEDATION: Patient intubated and on propofol drip CONTRAST:  70 mL Isovue 370 FLUOROSCOPY TIME:  Fluoroscopy Time: 11 minutes 30 seconds (487 mGy). COMPLICATIONS: None immediate. PROCEDURE: Informed consent was obtained from the patient following explanation of the procedure, risks, benefits and alternatives. The patient understands, agrees and consents for the procedure. All questions were addressed. A time out was performed prior to the initiation of the procedure. Maximal barrier sterile technique utilized including caps, mask, sterile gowns, sterile gloves, large sterile drape, hand hygiene, and Betadine prep. The right common femoral artery was interrogated with ultrasound and found to be widely patent. An image was obtained and stored for the medical record. Local anesthesia was attained by infiltration with 1% lidocaine. A small dermatotomy was made. Under real-time sonographic guidance, the vessel was punctured with a 21 gauge micropuncture needle. Using standard technique, the initial micro needle was exchanged over a 0.018 micro wire for a transitional 4 [place] micro sheath. The micro sheath was then exchanged over a 0.035 wire for a 5 French vascular sheath. A C2 cobra catheter was advanced in the abdominal aorta over a Bentson wire. The catheter was used to select the first vessel  arising from the ventral aorta. Contrast was injected. This is a left gastric artery arising directly from the aorta. Anatomy is normal. No evidence of active bleeding. The Cobra catheter was then used to select the origin of the celiac artery. Arteriography was performed. The splenic artery is small in caliber. The hepatic artery demonstrates normal hepatic arterial anatomy. The gastroduodenal artery is slightly irregular with spasm in the proximal segment. No evidence of active extravasation. A glidewire was successfully advanced into the common hepatic artery and the C2 cobra catheter was advanced into the common hepatic artery. Additional arteriography was performed in multiple obliquities. Again, the proximal gastroduodenal artery is irregular and spasmodic. No evidence of dissection, aneurysm or active bleeding. A Lantern microcatheter was then advanced over a Fathom 16 wire and used to select the distal aspect of the gastroduodenal artery. Arteriography was performed. The gastroepiploic arteries are widely patent. No evidence of active bleeding. Coil embolization was then performed using a 4 mm Ruby POD detachable micro coil followed by a 45 cm Ruby packing coil. Post embolization arteriography demonstrates complete occlusion of the vessel with stasis in the proximal segment. The Lantern microcatheter  was brought back into the common hepatic artery and arteriography was performed. Again, complete occlusion of the gastroduodenal artery. The right gastric artery is visualized. No evidence of active bleeding from the right gastric artery. The microcatheter was removed. The C2 cobra catheter was used to select the superior mesenteric artery. Arteriography was performed. Patent pancreaticoduodenal arcade supplying the gastroepiploic artery. No evidence of active hemorrhage. The C2 cobra catheter was removed. A limited right common femoral arteriogram was performed confirming common femoral arterial access.  Hemostasis was attained with the assistance of a Cordis ExoSeal extra arterial vascular plug. IMPRESSION: 1. No evidence of active hemorrhage. 2. 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 3. Prophylactic coil embolization of the gastroduodenal artery. Signed, Criselda Peaches, MD, [place] [facility] Radiology Electronically Signed   By: Jacqulynn Cadet M.D.   On: 09/07/2018 22:32   Ir US Guide Vasc Access Right  Result Date: 09/07/2018 INDICATION: 57 year old male with large volume upper GI bleed secondary to a very large duodenal ulcer. Patient underwent endoscopic intervention but continues to require pressors and large volume transfusion. He presents for mesenteric arteriography and embolization of any active bleeding and prophylactic embolization of the gastroduodenal artery. EXAM: IR ULTRASOUND GUIDANCE VASC ACCESS RIGHT; ADDITIONAL ARTERIOGRAPHY; IR EMBO ART VEN HEMORR LYMPH EXTRAV INC GUIDE ROADMAPPING; SELECTIVE VISCERAL ARTERIOGRAPHY 1. Ultrasound-guided vascular access 2. Catheterization of the left gastric artery where it arises directly from the aorta with arteriogram 3. Catheterization of the celiac artery with arteriogram 4. Catheterization of the common hepatic artery with arteriogram 5. Catheterization of the gastroduodenal artery with arteriogram 6. Coil embolization of the gastroduodenal artery 7. Catheterization of the superior mesenteric artery with arteriogram MEDICATIONS: None ANESTHESIA/SEDATION: Patient intubated and on propofol drip CONTRAST:  70 mL Isovue 370 FLUOROSCOPY TIME:  Fluoroscopy Time: 11 minutes 30 seconds (487 mGy). COMPLICATIONS: None immediate. PROCEDURE: Informed consent was obtained from the patient following explanation of the procedure, risks, benefits and alternatives. The patient understands, agrees and consents for the procedure. All questions were addressed. A time out was performed prior  to the initiation of the procedure. Maximal barrier sterile technique utilized including caps, mask, sterile gowns, sterile gloves, large sterile drape, hand hygiene, and Betadine prep. The right common femoral artery was interrogated with ultrasound and found to be widely patent. An image was obtained and stored for the medical record. Local anesthesia was attained by infiltration with 1% lidocaine. A small dermatotomy was made. Under real-time sonographic guidance, the vessel was punctured with a 21 gauge micropuncture needle. Using standard technique, the initial micro needle was exchanged over a 0.018 micro wire for a transitional 4 [place] micro sheath. The micro sheath was then exchanged over a 0.035 wire for a 5 French vascular sheath. A C2 cobra catheter was advanced in the abdominal aorta over a Bentson wire. The catheter was used to select the first vessel arising from the ventral aorta. Contrast was injected. This is a left gastric artery arising directly from the aorta. Anatomy is normal. No evidence of active bleeding. The Cobra catheter was then used to select the origin of the celiac artery. Arteriography was performed. The splenic artery is small in caliber. The hepatic artery demonstrates normal hepatic arterial anatomy. The gastroduodenal artery is slightly irregular with spasm in the proximal segment. No evidence of active extravasation. A glidewire was successfully advanced into the common hepatic artery and the C2 cobra catheter was advanced into the common hepatic artery. Additional arteriography was performed in  multiple obliquities. Again, the proximal gastroduodenal artery is irregular and spasmodic. No evidence of dissection, aneurysm or active bleeding. A Lantern microcatheter was then advanced over a Fathom 16 wire and used to select the distal aspect of the gastroduodenal artery. Arteriography was performed. The gastroepiploic arteries are widely patent. No evidence of active bleeding.  Coil embolization was then performed using a 4 mm Ruby POD detachable micro coil followed by a 45 cm Ruby packing coil. Post embolization arteriography demonstrates complete occlusion of the vessel with stasis in the proximal segment. The Lantern microcatheter was brought back into the common hepatic artery and arteriography was performed. Again, complete occlusion of the gastroduodenal artery. The right gastric artery is visualized. No evidence of active bleeding from the right gastric artery. The microcatheter was removed. The C2 cobra catheter was used to select the superior mesenteric artery. Arteriography was performed. Patent pancreaticoduodenal arcade supplying the gastroepiploic artery. No evidence of active hemorrhage. The C2 cobra catheter was removed. A limited right common femoral arteriogram was performed confirming common femoral arterial access. Hemostasis was attained with the assistance of a Cordis ExoSeal extra arterial vascular plug. IMPRESSION: 1. No evidence of active hemorrhage. 2. 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 3. Prophylactic coil embolization of the gastroduodenal artery. Signed, Criselda Peaches, MD, [place] [facility] Radiology Electronically Signed   By: Jacqulynn Cadet M.D.   On: 09/07/2018 22:32   Dg Chest Port 1 View  Result Date: 09/09/2018 CLINICAL DATA:  Respiratory failure EXAM: PORTABLE CHEST 1 VIEW COMPARISON:  09/07/2018 FINDINGS: Endotracheal tube and nasogastric catheter have been removed in the interval. Left jugular central line is again noted in the distal superior vena cava. The cardiac shadow is stable. The lungs are well aerated bilaterally with patchy bibasilar infiltrates and small left pleural effusion. IMPRESSION: Stable bibasilar infiltrates and small left pleural effusion. Electronically Signed   By: Inez Catalina M.D.   On: 09/09/2018 07:17   Dg Chest Port 1 View  Result  Date: 09/07/2018 CLINICAL DATA:  Status post fasciotomy and hematoma back UA shins in the right lower leg 2 days ago. Intubated patient. EXAM: PORTABLE CHEST 1 VIEW COMPARISON:  CT scan of the chest of September 06, 2018 FINDINGS: The lungs are well-expanded. There are patchy increased densities at both bases which are slightly more conspicuous today. There is a small right and and somewhat larger left pleural effusion. The heart and pulmonary vascularity are normal. The endotracheal tube tip projects 4.1 cm above the carina. The esophagogastric tube tip in proximal port project below the GE junction. The left internal jugular venous catheter tip projects over the midportion of the SVC. IMPRESSION: Bibasilar atelectasis or pneumonia. Small right pleural effusion and somewhat larger left pleural effusion. The support tubes are in reasonable position. Electronically Signed   By: David  Martinique M.D.   On: 09/07/2018 13:27   Vas [place] Burnard Bunting With/wo Tbi  Result Date: 09/15/2018 LOWER EXTREMITY DOPPLER STUDY Indications: Peripheral artery disease.  Vascular Interventions: Status post right popliteal - tibial embolectomy and                         fasciotomy. Comparison Study: No exam for comparison Performing Technologist: Birdena Crandall, [place] [facility]  Examination Guidelines: A complete evaluation includes at minimum, Doppler waveform signals and systolic blood pressure reading at the level of bilateral brachial, anterior tibial, and posterior tibial arteries, when vessel segments are accessible. Bilateral testing is considered  an integral part of a complete examination. Photoelectric Plethysmograph (PPG) waveforms and toe systolic pressure readings are included as required and additional duplex testing as needed. Limited examinations for reoccurring indications may be performed as noted.  ABI Findings: +--------+------------------+-----+----------+--------+ Right   Rt Pressure (mmHg)IndexWaveform  Comment   +--------+------------------+-----+----------+--------+ [id number]                    triphasic          +--------+------------------+-----+----------+--------+ PTA     179               1.36 triphasic          +--------+------------------+-----+----------+--------+ DP      152               1.15 monophasic         +--------+------------------+-----+----------+--------+ +--------+------------------+-----+---------+-------+ Left    Lt Pressure (mmHg)IndexWaveform Comment +--------+------------------+-----+---------+-------+ [id number]                    triphasic        +--------+------------------+-----+---------+-------+ PTA     176               1.33 triphasic        +--------+------------------+-----+---------+-------+ DP      162               1.23 triphasic        +--------+------------------+-----+---------+-------+ +-------+-----------+-----------+------------+------------+ ABI/TBIToday's ABIToday's TBIPrevious ABIPrevious TBI +-------+-----------+-----------+------------+------------+ Right  1.36                                           +-------+-----------+-----------+------------+------------+ Left   1.33                                           +-------+-----------+-----------+------------+------------+  Summary: Right: Resting right ankle-brachial index indicates noncompressible right lower extremity arteries. Left: Resting left ankle-brachial index indicates noncompressible left lower extremity arteries.  *See table(s) above for measurements and observations.  Electronically signed by Harold Barban MD on 09/15/2018 at 1:50:47 PM.   Final    Ct Renal Stone Study  Result Date: 08/22/2018 CLINICAL DATA:  Left flank pain. Nausea vomiting for 4 days. Previous appendectomy. EXAM: CT ABDOMEN AND PELVIS WITHOUT CONTRAST TECHNIQUE: Multidetector CT imaging of the abdomen and pelvis was performed following the standard protocol without IV  contrast. COMPARISON:  CT scan December 19, 2017 FINDINGS: Lower chest: No acute abnormality. Hepatobiliary: No focal liver abnormality is seen. No gallstones, gallbladder wall thickening, or biliary dilatation. Pancreas: Unremarkable. No pancreatic ductal dilatation or surrounding inflammatory changes. Spleen: Normal in size without focal abnormality. Adrenals/Urinary Tract: Adrenal glands are normal. A 3 mm stone is again noted in the mid right kidney. No hydronephrosis or perinephric stranding. Bilateral extrarenal pelvises. No ureterectasis or ureteral stones noted. The bladder is unremarkable. Stomach/Bowel: The stomach is distended. Small bowel is normal. Moderate to severe fecal loading throughout the colon with a stool ball in the rectum. By report, the patient is status post appendectomy. 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 Atherosclerotic changes  seen in the aorta, iliac vessels, and femoral vessels. Reproductive: Prostate is unremarkable. Other: No abdominal wall hernia or abnormality. No abdominopelvic ascites. Musculoskeletal: No acute or significant osseous findings. IMPRESSION: 1. Nonobstructive 3 mm stone in the right kidney. No renal obstruction or other stones noted. 2. Gastric distention of uncertain etiology. 3. Moderate to severe fecal loading throughout the colon. 4. Atherosclerotic changes throughout the abdominal aorta and iliac vessels. 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 Recommend followup by ultrasound in 3 years. This recommendation follows ACR consensus guidelines: White Paper of the ACR Incidental Findings Committee II on Vascular Findings. Joellyn Rued Radiol  2013; [id number] Electronically Signed   By: Dorise Bullion III M.D   On: 08/22/2018 19:54   Ir Embo Art  [place] Guide Roadmapping  Result Date: 09/07/2018 INDICATION: 57 year old male with large volume upper GI bleed secondary to a very large duodenal ulcer. Patient underwent endoscopic intervention but continues to require pressors and large volume transfusion. He presents for mesenteric arteriography and embolization of any active bleeding and prophylactic embolization of the gastroduodenal artery. EXAM: IR ULTRASOUND GUIDANCE VASC ACCESS RIGHT; ADDITIONAL ARTERIOGRAPHY; IR EMBO ART VEN HEMORR LYMPH EXTRAV INC GUIDE ROADMAPPING; SELECTIVE VISCERAL ARTERIOGRAPHY 1. Ultrasound-guided vascular access 2. Catheterization of the left gastric artery where it arises directly from the aorta with arteriogram 3. Catheterization of the celiac artery with arteriogram 4. Catheterization of the common hepatic artery with arteriogram 5. Catheterization of the gastroduodenal artery with arteriogram 6. Coil embolization of the gastroduodenal artery 7. Catheterization of the superior mesenteric artery with arteriogram MEDICATIONS: None ANESTHESIA/SEDATION: Patient intubated and on propofol drip CONTRAST:  70 mL Isovue 370 FLUOROSCOPY TIME:  Fluoroscopy Time: 11 minutes 30 seconds (487 mGy). COMPLICATIONS: None immediate. PROCEDURE: Informed consent was obtained from the patient following explanation of the procedure, risks, benefits and alternatives. The patient understands, agrees and consents for the procedure. All questions were addressed. A time out was performed prior to the initiation of the procedure. Maximal barrier sterile technique utilized including caps, mask, sterile gowns, sterile gloves, large sterile drape, hand hygiene, and Betadine prep. The right common femoral artery was interrogated with ultrasound and found to be widely patent. An image was obtained and stored for the medical  record. Local anesthesia was attained by infiltration with 1% lidocaine. A small dermatotomy was made. Under real-time sonographic guidance, the vessel was punctured with a 21 gauge micropuncture needle. Using standard technique, the initial micro needle was exchanged over a 0.018 micro wire for a transitional 4 [place] micro sheath. The micro sheath was then exchanged over a 0.035 wire for a 5 French vascular sheath. A C2 cobra catheter was advanced in the abdominal aorta over a Bentson wire. The catheter was used to select the first vessel arising from the ventral aorta. Contrast was injected. This is a left gastric artery arising directly from the aorta. Anatomy is normal. No evidence of active bleeding. The Cobra catheter was then used to select the origin of the celiac artery. Arteriography was performed. The splenic artery is small in caliber. The hepatic artery demonstrates normal hepatic arterial anatomy. The gastroduodenal artery is slightly irregular with spasm in the proximal segment. No evidence of active extravasation. A glidewire was successfully advanced into the common hepatic artery and the C2 cobra catheter was advanced into the common hepatic artery. Additional arteriography was performed in multiple  obliquities. Again, the proximal gastroduodenal artery is irregular and spasmodic. No evidence of dissection, aneurysm or active bleeding. A Lantern microcatheter was then advanced over a Fathom 16 wire and used to select the distal aspect of the gastroduodenal artery. Arteriography was performed. The gastroepiploic arteries are widely patent. No evidence of active bleeding. Coil embolization was then performed using a 4 mm Ruby POD detachable micro coil followed by a 45 cm Ruby packing coil. Post embolization arteriography demonstrates complete occlusion of the vessel with stasis in the proximal segment. The Lantern microcatheter was brought back into the common hepatic artery and arteriography was  performed. Again, complete occlusion of the gastroduodenal artery. The right gastric artery is visualized. No evidence of active bleeding from the right gastric artery. The microcatheter was removed. The C2 cobra catheter was used to select the superior mesenteric artery. Arteriography was performed. Patent pancreaticoduodenal arcade supplying the gastroepiploic artery. No evidence of active hemorrhage. The C2 cobra catheter was removed. A limited right common femoral arteriogram was performed confirming common femoral arterial access. Hemostasis was attained with the assistance of a Cordis ExoSeal extra arterial vascular plug. IMPRESSION: 1. No evidence of active hemorrhage. 2. 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 3. Prophylactic coil embolization of the gastroduodenal artery. Signed, Criselda Peaches, MD, [facility] Electronically Signed   By: Jacqulynn Cadet M.D.   On: 09/07/2018 22:32   Ct Angio Abd/pel W/ And/or W/o  Result Date: 09/11/2018 CLINICAL DATA:  Status postoperative right popliteal and tibial embolectomy for embolic disease. Known bilateral iliac artery aneurysmal disease. EXAM: CT ANGIOGRAPHY ABDOMEN AND PELVIS WITH CONTRAST TECHNIQUE: Multidetector CT imaging of the abdomen and pelvis was performed using the standard protocol during bolus administration of intravenous contrast. Multiplanar reconstructed images and MIPs were obtained and reviewed to evaluate the vascular anatomy. CONTRAST:  138m ISOVUE-370 IOPAMIDOL (ISOVUE-370) INJECTION 76% COMPARISON:  CTA of the abdomen on 01/30/2018 and CT of the abdomen and pelvis with contrast on 12/19/2017. FINDINGS: VASCULAR Aorta: Stable mild dilatation of the infrarenal aorta with maximal diameter of 3.1 cm. Celiac: Mild origin stenosis of 25-30%. Distal branches are patent. Status post transcatheter coil embolization of the gastroduodenal artery on  09/07/2018. Hepatic arteries are normally patent. SMA: Normally patent. Renals: Bilateral single renal arteries demonstrate normal patency. IMA: Normally patent. Inflow: Fusiform aneurysmal disease present of bilateral common iliac arteries as previously demonstrated. Maximal caliber of the right common iliac artery is 2.8-2.9 cm and maximal caliber of the right common iliac artery is 2.7 cm. These measurements are stable since prior studies. The significant change since the prior studies is a much more prominent and new component of mural thrombus in both common iliac arteries, right greater than left. In the proximal right common iliac artery, mural thrombus narrows the aneurysmal lumen by at least 50% in its narrowest portion. Eccentric mural thrombus is present at the origin of the right common iliac artery and also distally just prior to the common iliac bifurcation without significant luminal stenosis. Aneurysmal disease again noted of bilateral internal iliac arteries with the left measuring approximately 2.5 cm in greatest diameter and the right measuring 1.5 cm. Both internal iliac arteries also demonstrate significant mural thrombus without occlusion. Mural thrombus is stable to slightly more prominent compared to the 12/19/2017 study. Bilateral external iliac arteries are normally patent and demonstrate diffuse arteriomegaly with maximum caliber 1.5 cm. Proximal Outflow: Bilateral common femoral arteries demonstrate dilatation with the left measuring up to  2.1 cm in diameter. Mural thrombus is present along the posterior lumen with approximately 50% reduction in luminal diameter. No thrombus is seen extending into proximal SFA or profunda femoral arteries. On the right side the common femoral artery measures up to 1.8 cm in the femoral bifurcation appears widely patent. Veins: Delayed venous phase imaging shows patent venous structures without evidence of thrombus. There is some probable mass effect of  ectatic external iliac arteries on the distal aspects of bilateral external iliac veins without evidence of associated deep venous thrombosis or significant collateral vein formation. Review of the MIP images confirms the above findings. NON-VASCULAR Lower chest: Small bilateral pleural effusions with bibasilar atelectasis. Hepatobiliary: No focal liver abnormality is seen. No gallstones, gallbladder wall thickening, or biliary dilatation. Pancreas: Since the March study, there is a change in appearance of the pancreas on the venous phase of imaging. The tail of the pancreas now appears progressively atrophic with associated pancreatic ductal dilatation up to approximately 7 mm. At the junction of the body and head of the pancreas, there is suggestion a potential ill-defined low-attenuation mass that could measure as much as 3 cm in AP diameter. Borders are very difficult to delineate by CT. There is no associated biliary obstruction. 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 Recommend eventual correlation with MRI of the abdomen with and without gadolinium. There may be a window of time recommended to wait to perform MRI after placement of the gastroduodenal artery Ruby coils. This can be further investigated based on manufacturer recommendation. If there may be a significant delay in ability to perform MRI, consider endoscopic ultrasound to investigate the pancreas. Spleen: Normal in size without focal abnormality. Adrenals/Urinary Tract: Adrenal glands are unremarkable. Kidneys are normal, without renal calculi, focal lesion, or hydronephrosis. Bladder is unremarkable. Stomach/Bowel: Gastric fold thickening and prominent fold thickening of the duodenum may be consistent with known peptic ulcer disease. No evidence of bowel obstruction or free intraperitoneal air. No focal abscess identified. Lymphatic: No enlarged lymph nodes identified. Reproductive: Prostate is unremarkable. Other: Diffuse anasarca of  the body wall and edema of the mesenteric and retroperitoneal fat likely relate to low albumin and malnutrition. Musculoskeletal: Moderate degenerative disc disease at L5-S1. IMPRESSION: VASCULAR 1. 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 No complications evident after embolization. 2. Stable mild aneurysmal disease of the distal abdominal aorta measuring 3.1 cm. 3. 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 The major change since prior imaging is significant increase in mural thrombus within the dilated common iliac arteries bilaterally, especially on the right. This is most likely the source distal emboli to the right distal leg. Mural thrombus in the internal iliac artery aneurysms is stable to slightly more prominent. 4. Stable diffuse enlargement bilateral external iliac and common femoral arteries. The left common femoral artery does contain significant posterior wall mural thrombus. NON-VASCULAR 1. The most significant nonvascular finding is potential development of a mass of the pancreas at the juncture of the head and body causing progressive atrophy of the tail of the pancreas with associated pancreatic ductal dilatation. On the venous phase of imaging, delineation of the potential mass is quite vague but the region of mass may measure as much as 3 cm. There may be some issue with immediate performance of MRI in the setting recent coiling of the gastroduodenal artery even if the coils are MRI compatible. This can be checked with the manufacturer recommendation. If MRI can be performed immediately, an MRI  of the abdomen with and without gadolinium would be helpful for further evaluation. If there is going to be significant delay, consider EUS characterization of the pancreas. 2. Diffuse body wall anasarca and edema of the mesenteric fat and retroperitoneum likely reflects low albumin and malnutrition. 3. Prominent gastric folds and  duodenal folds without evidence of bowel obstruction or perforation. Electronically Signed   By: Aletta Edouard M.D.   On: 09/11/2018 12:36     LOS: 16 days   Signature  Lala Lund M.D on 09/19/2018 at 10:36 AM   -  To page go to [email or web address] - password [email or web address]

## 2018-09-20 DIAGNOSIS — I10 Essential (primary) hypertension: Secondary | ICD-10-CM

## 2018-09-20 DIAGNOSIS — C259 Malignant neoplasm of pancreas, unspecified: Secondary | ICD-10-CM | POA: Diagnosis present

## 2018-09-20 DIAGNOSIS — E119 Type 2 diabetes mellitus without complications: Secondary | ICD-10-CM

## 2018-09-20 LAB — COMPREHENSIVE METABOLIC PANEL
ALT: 21 U/L (ref 0–44)
AST: 14 U/L — AB (ref 15–41)
Albumin: 1.6 g/dL — ABNORMAL LOW (ref 3.5–5.0)
Alkaline Phosphatase: 65 U/L (ref 38–126)
Anion gap: 8 (ref 5–15)
BUN: 10 mg/dL (ref 6–20)
CO2: 28 mmol/L (ref 22–32)
Calcium: 7.6 mg/dL — ABNORMAL LOW (ref 8.9–10.3)
Chloride: 96 mmol/L — ABNORMAL LOW (ref 98–111)
Creatinine, Ser: 0.61 mg/dL (ref 0.61–1.24)
GFR calc Af Amer: >60 mL/min (ref >60)
GFR calc non Af Amer: >60 mL/min (ref >60)
GLUCOSE: 264 mg/dL — AB (ref 70–99)
Potassium: 3.8 mmol/L (ref 3.5–5.1)
Sodium: 132 mmol/L — ABNORMAL LOW (ref 135–145)
Total Bilirubin: 0.4 mg/dL (ref 0.3–1.2)
Total Protein: 4.8 g/dL — ABNORMAL LOW (ref 6.5–8.1)

## 2018-09-20 LAB — MAGNESIUM: Magnesium: 1.9 mg/dL (ref 1.7–2.4)

## 2018-09-20 LAB — CBC
HCT: 25.6 % — ABNORMAL LOW (ref 39.0–52.0)
HEMOGLOBIN: 8.2 g/dL — AB (ref 13.0–17.0)
MCH: 27.4 pg (ref 26.0–34.0)
MCHC: 32 g/dL (ref 30.0–36.0)
MCV: 85.6 fL (ref 80.0–100.0)
Platelets: 393 10*3/uL (ref 150–400)
RBC: 2.99 MIL/uL — ABNORMAL LOW (ref 4.22–5.81)
RDW: 14.5 % (ref 11.5–15.5)
WBC: 10.1 10*3/uL (ref 4.0–10.5)
nRBC: 0 % (ref 0.0–0.2)

## 2018-09-20 LAB — GLUCOSE, CAPILLARY
Glucose-Capillary: 286 mg/dL — ABNORMAL HIGH (ref 70–99)
Glucose-Capillary: 162 mg/dL — ABNORMAL HIGH (ref 70–99)
Glucose-Capillary: 269 mg/dL — ABNORMAL HIGH (ref 70–99)
Glucose-Capillary: 184 mg/dL — ABNORMAL HIGH (ref 70–99)

## 2018-09-20 MED ORDER — FUROSEMIDE 10 MG/ML IJ SOLN
20.0000 mg | Freq: Once | INTRAMUSCULAR | Status: AC
  Administered 2018-09-21: 20 mg via INTRAVENOUS

## 2018-09-20 MED ORDER — FUROSEMIDE 10 MG/ML IJ SOLN
20.0000 mg | Freq: Once | INTRAMUSCULAR | Status: AC
  Administered 2018-09-21: 20 mg via INTRAVENOUS
  Filled 2018-09-20: qty 2

## 2018-09-20 MED ORDER — SODIUM CHLORIDE 0.9% IV SOLUTION
Freq: Once | INTRAVENOUS | Status: AC
  Administered 2018-09-21: 10 mL via INTRAVENOUS

## 2018-09-20 NOTE — Progress Notes (Signed)
Order placed to transfuse 2 units PRBC by Dr. Marin Olp but pt prefers to have transfusion at 5 am. Hgb 8.1 This writer explained to pt that  MD might want to evaluate Hgb post transfusion but pt remains adamant. Pt reports that frequent Vitals will interrupt with his sleep and would rather receive blood in am. Night coverage provider notified

## 2018-09-20 NOTE — Progress Notes (Signed)
[facility] Progress Note   Chief Complaint:   GI bleed   SUBJECTIVE:    appetite is poor, food just doesn't taste good. No N/V. No abdominal pain. Stools dark but not black   ASSESSMENT AND PLAN:   1. 57 yo male with prolonged hospitalization. He underwent embolectomy of lower extremity with development of hematoma requiring fasciotomy with signiifcant anemia. He developed progressive anemia / shock. We saw him in consult on 09/07/18. Marland Kitchen EGD on 12/17 >>> severe esophagitis with bleeding, ulceration of entire stomach, partially obstructing / oozing duodenal ulcer with clot. He underwent DGA embolization.  -continue BID PPI x 8 weeks  -sent for h.pylori stool antigen ( results not available). Duodenal bx surprisingly negative for malignancy but still have suspect it is malignant.   -slow drift in hgb, currently at 8.2. He has already had > 10 units of blood this admission  2. Pancreatic adenocarcinoma, confirmed by FNA. He has a 3.6cm x 2.1cm mass in the neck of pancreas that surrounds the celiac trunk, obstructs the main pancreatic duct and appears to have also invaded the duodenal bulb creating a large, malignant, friable ulcer.    OBJECTIVE:     Vital signs in last 24 hours: Temp:  [98.6 F (37 C)-99.8 F (37.7 C)] 98.6 F (37 C) (12/30 0455) Pulse Rate:  [93-111] 93 (12/30 0455) Resp:  [18-20] 18 (12/30 0455) BP: (115-120)/(75-78) 116/78 (12/30 0455) SpO2:  [100 %] 100 % (12/30 0455) Last BM Date: 09/20/18 General:   Alert, thin male in NAD EENT:  Normal hearing, non icteric sclera, conjunctive pink.  Heart:  Regular rate and rhythm.  Mild RLE edema   Pulm: Normal respiratory effort, lungs CTA bilaterally without wheezes or crackles. Abdomen:  Soft, nondistended, nontender.  Normal bowel sounds, no masses felt.       Neurologic:  Alert and  oriented x4;  grossly normal neurologically. Psych:  Pleasant, cooperative.  Normal mood and  affect.   Intake/Output from previous day: No intake/output data recorded. Intake/Output this shift: Total I/O In: -  Out: 500 [Urine:500]  Lab Results: Recent Labs    09/18/18 0426 09/19/18 0356 09/20/18 0421  WBC 19.4* 14.0* 10.1  HGB 8.6* 8.4* 8.2*  HCT 25.6* 25.2* 25.6*  PLT 382 390 393   BMET Recent Labs    09/18/18 0426 09/19/18 0356 09/20/18 0421  NA 132* 133* 132*  K 3.9 3.4* 3.8  CL 99 98 96*  CO2 25 26 28   GLUCOSE 101* 235* 264*  BUN 7 7 10   CREATININE 0.59* 0.64 0.61  CALCIUM 7.5* 7.6* 7.6*   LFT Recent Labs    09/20/18 0421  PROT 4.8*  ALBUMIN 1.6*  AST 14*  ALT 21  ALKPHOS 65  BILITOT 0.4    Principal Problem:   Popliteal artery embolus (HCC) Active Problems:   DM (diabetes mellitus), type 2, uncontrolled ([place])   Essential hypertension   Dilated cardiomyopathy (HCC)   Chest pain   AAA (abdominal aortic aneurysm) (HCC)   Ischemic foot   Right leg claudication (HCC)   PVD (peripheral vascular disease) ([place])   Hemorrhagic shock ([place])   Nontraumatic ischemic infarction of muscle of right lower leg   Upper GI bleed   Cocaine abuse ([place])   Acute blood loss anemia   Chronic pain syndrome   Pancreatic mass   Duodenal ulcer with hemorrhage     LOS: 17 days   Tye Savoy ,NP 09/20/2018, 3:08 PM

## 2018-09-20 NOTE — Progress Notes (Signed)
IP rehab admissions - Noted recommendations are now for SNF placement.  Patient cannot tolerate CIR and SNF is the best option at this time.  Call me for questions.  [phone]

## 2018-09-20 NOTE — Progress Notes (Signed)
PROGRESS NOTE        PATIENT DETAILS Name: Ryan Medina Age: 57 y.o. Sex: male Date of Birth: [birth year] Admit Date: 09/03/2018 Admitting Physician [name], MD QIH:[id number], No Pcp Per  Brief Narrative: Patient is a 57 y.o. male with history of cocaine use, chronic systolic heart failure presented to the hospital on 12/13 with a right ischemic leg secondary to acute popliteal and tibial embolus, underwent embolectomy on 12/13 and subsequently was placed on a heparin drip.  He unfortunately developed a acute right lower leg hematoma with compartment syndrome requiring fasciotomy on 12/15.  Further hospital course was complicated by development of hemorrhagic shock with acute blood loss anemia secondary to a bleeding duodenal ulcer-requiring ICU transfer-intubation for airway protection and IR evaluation with mesenteric angiogram and GDA embolization.  Upon stability he was transferred back to the triad hospitalist service on 12/19.  See below for further details.  Subjective:  Patient in bed, appears comfortable, denies any headache, no fever, no chest pain or pressure, no shortness of breath , no abdominal pain. No focal weakness.  No nausea vomiting or bright red blood in stool.   Assessment/Plan:  Right ischemic leg secondary to popliteal artery/tibial artery embolism-s/p embolectomy on 63/87 complicated by right lower extremity hematoma with compartment syndrome requiring fasciotomy on 12/15:   Source of embolization unclear, required fasciotomy on 09/05/2018 by vascular surgery status post closure of fasciotomy on 09/10/2018.  His course was complicated by large hematoma in the right leg along with significant life-threatening GI bleed. Case discussed with vascular surgeon Dr. Monica Martinez who wants to continue anticoagulation as much as possible.   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  Kindly see  anticoagulation below.  Cardiology for TEE has been consulted as well.  Await report.  Upper GI bleeding with hemorrhagic shock and acute blood loss anemia: Developed hemorrhagic shock, was seen by GI, required multiple PRBC transfusions last transfusion on 09/09/2018.  Was seen by GI and eventually underwent gastroduodenal artery embolization on 09/07/2018.  Total 8 units of packed RBC, 1 unit of FFP and 1 unit of platelets transfused around 09/08/2018. GI note noted from 09/10/2018 (okay with heparin challenge).  He was rechallenged with heparin cautiously on 09/12/2018 unfortunately he bled again on 09/14/2018 after which heparin was stopped and he received another 3 units of packed RBCs.  So far he has received 11 units of packed RBCs.  He then underwent EUS on 09/16/2018 which showed +ve Duodenal Ulcer which was still raw. Continue PPI and Hold Heparin indefinitely.    Hypokalemia and hypomagnesemia.  Replaced and now stable.    Urinary retention.  On Foley and Flomax.  Foley removed on 09/15/2018.     Acute hypoxic respiratory failure: Intubated when he developed hemorrhagic shock secondary to GI bleeding, successfully extubated-currently stable on 2 L of oxygen via nasal cannula.  Chronic systolic heart failure (EF 45-50% by TTE on 09/04/2018): Reasonably well compensated-follow weights, volume status, electrolytes closely.  Cocaine use/tobacco use: Counseled-I am still not sure if he has any intention of quitting.  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  MRI noted & confirms mass, CA 19.9 was elevated, GI seeing and underwent EUS with biopsy on 09/16/2018, biopsy consistent with adenocarcinoma.  Case discussed with oncologist  Dr. Marin Olp oncology who will see the patient on 09/20/2018.  Insulin-dependent DM-2: Last A1c on 12/2 was 18.2.  Labile sugars with episodes of hypoglycemia, have adjusted Lantus dose and  sliding scale on 09/19/2018.  We will continue to monitor closely he is also very noncompliant with diet, will monitor.  CBG (last 3)  Recent Labs    09/19/18 1752 09/19/18 2110 09/20/18 0834  GLUCAP 103* 96 286*      DVT Prophylaxis: Heparin drip had to be stopped on 09/15/2018 due to GI bleed, will place SCDs in the left leg.  Code Status: Full code   Family Communication: Wife at bedside 12/25 & 12/26  Disposition Plan: Tele  Antimicrobial agents: Anti-infectives (From admission, onward)   Start     Dose/Rate Route Frequency Ordered Stop   09/10/18 2130  ceFAZolin (ANCEF) IVPB 2g/100 mL premix     2 g 200 mL/hr over 30 Minutes Intravenous Every 8 hours 09/10/18 1634 09/11/18 0700   09/10/18 0800  ceFAZolin (ANCEF) IVPB 1 g/50 mL premix    Note to Pharmacy:  Send with pt to OR   1 g 100 mL/hr over 30 Minutes Intravenous On call 09/09/18 0756 09/10/18 1407   09/07/18 1300  erythromycin 250 mg in sodium chloride 0.9 % 100 mL IVPB     250 mg 100 mL/hr over 60 Minutes Intravenous Once 09/07/18 1125 09/07/18 2351   09/06/18 0000  ceFAZolin (ANCEF) IVPB 1 g/50 mL premix    Note to Pharmacy:  Send with pt to OR   1 g 100 mL/hr over 30 Minutes Intravenous On call 09/05/18 0849 09/05/18 0952   09/03/18 2245  ceFAZolin (ANCEF) IVPB 2g/100 mL premix     2 g 200 mL/hr over 30 Minutes Intravenous Every 8 hours 09/03/18 2236 09/04/18 1444   09/03/18 1615  ceFAZolin (ANCEF) IVPB 2g/100 mL premix  Status:  Discontinued     2 g 200 mL/hr over 30 Minutes Intravenous On call to O.R. 09/03/18 1600 09/03/18 2035      Procedures:  12/17>>Mesenteric angiogram and coil embolization of the GDA  12/17>>EGD  12/15>> 1.  Evacuation hematoma right leg 2.  Evacuation of lymphocele right leg 3.  4 compartment fasciotomy 4.  Placement of VAC (medial and lateral)  12/13>> 1.  Ultrasound-guided access to the left common femoral artery 2.  Aortogram with bilateral iliac  arteriogram 3.  Selective catheterization of the right external iliac artery with right lower extremity runoff  MRI Abd.  Pancreatic mass.   TEE -   EUS  Impression:  - 3.6cm by 2.1cm mass in the neck of pancreas that surrounds the celiac trunk, obstructs the main pancreatic duct and appears to have also invaded the duodenal bulb created a large, malignant, friable ulcer. I biopsied the abnormal, firm mucosa surrounding the duodenal ulcer and sampled the pancreatic mass with transgastric EUS FNA. Preliminary cytology review from the pancreatic mass was positive for malignancy.   Recommendation   - Return patient to [facility] for ongoing care. - He has recieved 13 units of blood this admission, 3 of them in the past 24 hours. He will continue to ooze from the duodenal ulcer that I suspect is malignant, related to the nearby large pancreatic mass. Judicious use of bloodthinners is strongly recommended. - Await final cytology/pathology reports; both hopefully available tomorrow.        CONSULTS:  pulmonary/intensive care, vascular surgery and IR, GI, Cards  Time spent: 40 minutes-Greater than 50% of  this time was spent in counseling, explanation of diagnosis, planning of further management, and coordination of care.  MEDICATIONS: Scheduled Meds: . atorvastatin  20 mg Oral Daily  . chlorhexidine gluconate (MEDLINE KIT)  15 mL Mouth Rinse BID  . insulin aspart  0-5 Units Subcutaneous QHS  . insulin aspart  0-9 Units Subcutaneous TID WC  . insulin glargine  7 Units Subcutaneous Daily  . lidocaine  15 mL Oral Once  . mouth rinse  15 mL Mouth Rinse BID  . pantoprazole (PROTONIX) IV  40 mg Intravenous Q12H  . tamsulosin  0.4 mg Oral Daily   Continuous Infusions: . sodium chloride     PRN Meds:.sodium chloride, alum & mag hydroxide-simeth, famotidine, ipratropium-albuterol, LORazepam, morphine injection, [DISCONTINUED] ondansetron **OR** ondansetron (ZOFRAN) IV,  oxyCODONE-acetaminophen, polyethylene glycol, senna-docusate, sodium chloride flush   PHYSICAL EXAM: Vital signs: Vitals:   09/19/18 1601 09/19/18 1602 09/19/18 2109 09/20/18 0455  BP: 115/77 115/77 120/75 116/78  Pulse: 97 97 (!) 111 93  Resp: _0 Temp: 99.8 F (37.7 C) 99.8 F (37.7 C) 99.3 F (37.4 C) 98.6 F (37 C)  TempSrc: Oral Oral Oral Oral  SpO2: 100% 100% 100% 100%  Weight:      Height:       Filed Weights   09/03/18 1650 09/16/18 1325  Weight: 66.7 kg 66.7 kg   Body mass index is 20.51 kg/m.   Exam  Awake Alert, Oriented X 3, No new F.N deficits, Normal affect [place].AT,PERRAL Supple Neck,No JVD, No cervical lymphadenopathy appriciated.  Symmetrical Chest wall movement, Good air movement bilaterally, CTAB RRR,No Gallops, Rubs or new Murmurs, No Parasternal Heave +ve B.Sounds, Abd Soft, No tenderness, No organomegaly appriciated, No rebound - guarding or rigidity. No Cyanosis, Clubbing or edema, No new Rash or bruise  I have personally reviewed following labs and imaging studies  LABORATORY DATA: CBC: Recent Labs  Lab 09/16/18 0550 09/17/18 0358 09/18/18 0426 09/19/18 0356 09/20/18 0421  WBC 10.7* 13.3* 19.4* 14.0* 10.1  HGB 9.1* 8.8* 8.6* 8.4* 8.2*  HCT 27.4* 25.9* 25.6* 25.2* 25.6*  MCV 83.5 84.4 84.5 85.4 85.6  PLT 290 354 382 390 540    Basic Metabolic Panel: Recent Labs  Lab 09/16/18 0550 09/17/18 0358 09/18/18 0426 09/19/18 0356 09/20/18 0421  NA 134* 135 132* 133* 132*  K 3.8 3.4* 3.9 3.4* 3.8  CL 102 101 99 98 96*  CO2 _1 GLUCOSE 138* 68* 101* 235* 264*  BUN _2 CREATININE 0.69 0.62 0.59* 0.64 0.61  CALCIUM 7.5* 7.5* 7.5* 7.6* 7.6*  MG 1.7 1.6* 1.8  --  1.9    GFR: Estimated Creatinine Clearance: 96.1 mL/min (by C-G formula based on SCr of 0.61 mg/dL).  Liver Function Tests: Recent Labs  Lab 09/16/18 0550 09/17/18 0358 09/18/18 0426 09/19/18 0356 09/20/18 0421  AST 33 _3 14*  ALT  _4 ALKPHOS 49 51 69 70 65  BILITOT 1.0 1.0 0.6 0.4 0.4  PROT 4.4* 4.4* 4.6* 4.8* 4.8*  ALBUMIN 1.6* 1.5* 1.6* 1.5* 1.6*   No results for input(s): LIPASE, AMYLASE in the last 168 hours. No results for input(s): AMMONIA in the last 168 hours.  Coagulation Profile: No results for input(s): INR, PROTIME in the last 168 hours.  Cardiac Enzymes: No results for input(s): CKTOTAL, CKMB, CKMBINDEX, TROPONINI in the last 168 hours.  BNP (last 3 results) No results for input(s): PROBNP  in the last 8760 hours.  HbA1C: No results for input(s): HGBA1C in the last 72 hours.  CBG: Recent Labs  Lab 09/19/18 1129 09/19/18 1226 09/19/18 1752 09/19/18 2110 09/20/18 0834  GLUCAP 147* 173* 103* 96 286*    Lipid Profile: No results for input(s): CHOL, HDL, LDLCALC, TRIG, CHOLHDL, LDLDIRECT in the last 72 hours.  Thyroid Function Tests: No results for input(s): TSH, T4TOTAL, FREET4, T3FREE, THYROIDAB in the last 72 hours.  Anemia Panel: No results for input(s): VITAMINB12, FOLATE, FERRITIN, TIBC, IRON, RETICCTPCT in the last 72 hours.  Urine analysis:    Component Value Date/Time   COLORURINE YELLOW 09/18/2018 1152   APPEARANCEUR HAZY (A) 09/18/2018 1152   LABSPEC 1.011 09/18/2018 1152   PHURINE 6.0 09/18/2018 1152   GLUCOSEU NEGATIVE 09/18/2018 1152   HGBUR NEGATIVE 09/18/2018 1152   BILIRUBINUR NEGATIVE 09/18/2018 1152   KETONESUR NEGATIVE 09/18/2018 1152   PROTEINUR NEGATIVE 09/18/2018 1152   UROBILINOGEN 4.0 (H) 12/05/2016 0921   NITRITE NEGATIVE 09/18/2018 1152   LEUKOCYTESUR SMALL (A) 09/18/2018 1152    Sepsis Labs: Lactic Acid, Venous    Component Value Date/Time   LATICACIDVEN 1.7 09/07/2018 1600    MICROBIOLOGY: No results found for this or any previous visit (from the past 240 hour(s)).  RADIOLOGY STUDIES/RESULTS: Dg Chest 2 View  Result Date: 09/18/2018 CLINICAL DATA:  57 year old former smoker who had a fever and shortness of breath yesterday.  Breathing has improved significantly after nebulizer treatment. Follow-up pneumonia. EXAM: CHEST - 2 VIEW COMPARISON:  09/09/2018 and earlier, including CT chest 09/06/2018. FINDINGS: Cardiac silhouette normal in size, unchanged. Thoracic aorta mildly tortuous, unchanged. Hilar and mediastinal contours otherwise unremarkable. Mild emphysematous changes in both lungs as noted on the recent CT. Linear atelectasis or developing scar in the LEFT LOWER LOBE at the site of the recent pneumonia. Lungs otherwise clear. No localized airspace consolidation. No pleural effusions. No pneumothorax. Normal pulmonary vascularity. Visualized bony thorax intact. IMPRESSION: COPD/emphysema. Linear atelectasis or developing scar in the LEFT LOWER LOBE at the site of the recent pneumonia. No acute cardiopulmonary disease otherwise. Electronically Signed   By: Evangeline Dakin M.D.   On: 09/18/2018 12:41   Dg Abd 1 View  Result Date: 09/07/2018 CLINICAL DATA:  OG tube placement. EXAM: ABDOMEN - 1 VIEW COMPARISON:  CT abdomen pelvis 08/22/2018. FINDINGS: OG tube is in place with the side port and tip in the body of the stomach. The stomach is distended. IMPRESSION: OG tube in good position. Electronically Signed   By: Inge Rise M.D.   On: 09/07/2018 13:31   Ct Angio Chest Pe W Or Wo Contrast  Result Date: 09/06/2018 CLINICAL DATA:  Evaluate thoracic aortic aneurysm. EXAM: CT ANGIOGRAPHY CHEST WITH CONTRAST TECHNIQUE: Multidetector CT imaging of the chest was performed using the standard protocol during bolus administration of intravenous contrast. Multiplanar CT image reconstructions and MIPs were obtained to evaluate the vascular anatomy. CONTRAST:  65 mL ISOVUE-370 IOPAMIDOL (ISOVUE-370) INJECTION 76% COMPARISON:  01/30/2018; 10/14/2016; 04/25/2026 FINDINGS: Vascular Findings: No evidence of thoracic aortic aneurysm with measurements as follows. No definite evidence of thoracic aortic dissection or periaortic stranding  on this nongated examination. Bovine configuration of the aortic arch. The branch vessels of the aortic arch appear widely patent throughout their imaged courses. The descending thoracic aorta is of normal caliber and widely patent without hemodynamically significant stenosis. There is a very minimal amount of atherosclerotic plaque involving the proximal aspect of the descending thoracic aorta. Cardiomegaly.  No pericardial effusion.  Although this examination was not tailored for the evaluation the pulmonary arteries, there are no discrete filling defects within the central pulmonary arterial tree to suggest central pulmonary embolism. Borderline enlarged caliber the main pulmonary artery measuring 32 mm in diameter (image 67, series 6) ------------------------------------------------------------- Thoracic aortic measurements: Sinotubular junction 33 mm as measured in greatest oblique coronal dimension. Proximal ascending aorta 35 mm as measured in greatest oblique axial dimension at the level of the main pulmonary artery (image 70, series 6); and approximately 36 mm in greatest oblique short axis coronal diameter (coronal image 48, series 9). Aortic arch aorta 28 mm as measured in greatest oblique sagittal dimension. Proximal descending thoracic aorta 30 mm as measured in greatest oblique axial dimension at the level of the main pulmonary artery. Distal descending thoracic aorta 29 mm as measured in greatest oblique axial dimension at the level of the diaphragmatic hiatus. Review of the MIP images confirms the above findings. ------------------------------------------------------------- Non-Vascular Findings: Mediastinum/Lymph Nodes: Prominent right hilar lymph nodes are not enlarged by size criteria with index right suprahilar lymph node measuring 0.8 cm in greatest short axis diameter (image 71, series 6) and index right infrahilar lymph node measuring 0.7 cm (image 88). No definitive bulky mediastinal, hilar or  axillary lymphadenopathy. Lungs/Pleura: Interval development of small bilateral pleural effusions with worsening bibasilar consolidative opacities and associated air bronchograms, right greater than left. Interval development of an approximately 1.0 x 0.5 cm subpleural consolidative opacity within the right upper lobe (image 36, series 7). Unchanged punctate (approximately 3 mm) calcified granuloma within the left lower lobe (image 75, series 3). Evaluation for additional pulmonary nodules is degraded secondary to bibasilar airspace opacities. The central pulmonary airways appear widely patent. Upper abdomen: Fluid distention of the mid and distal aspects of the esophagus with marked distension of the imaged superior aspect of the stomach. Musculoskeletal: No acute or aggressive osseous abnormalities. There is incomplete fusion involving the spinous processes of multiple thoracic vertebral bodies, unchanged. Regional soft tissues appear normal. Normal appearance of the thyroid gland. IMPRESSION: 1. No evidence of thoracic aortic aneurysm or dissection. 2. Small bilateral effusions with associated bibasilar consolidative opacities with associated air bronchograms, atelectasis versus infiltrate. Follow-up chest radiograph in 4-6 weeks after treatment is recommended to ensure resolution. 3. Fluid distension of the mid and distal aspects of the esophagus as well as marked distension of the imaged superior aspect stomach - correlation for gastric outlet obstructive symptoms is advised. 4. Cardiomegaly with enlargement of the caliber the main pulmonary artery as could be seen in the setting of pulmonary arterial hypertension. Electronically Signed   By: Sandi Mariscal M.D.   On: 09/06/2018 09:20   Mr Abdomen W Wo Contrast  Result Date: 09/12/2018 CLINICAL DATA:  Suspicion of pancreatic neck mass on CT. EXAM: MRI ABDOMEN WITHOUT AND WITH CONTRAST TECHNIQUE: Multiplanar multisequence MR imaging of the abdomen was  performed both before and after the administration of intravenous contrast. CONTRAST:  6 cc Gadavist COMPARISON:  CT of 1 day prior. Stone study of 08/22/2018. More remote CT of 12/19/2017. FINDINGS: Moderatemultifactorial degradation, including respiratory motion and susceptibility artifact from embolization coils. Lower chest: Bilateral pleural fluid with bibasilar airspace disease. Normal heart size. Hepatobiliary: Left hepatic lobe 9 mm cyst. Too small to characterizea right hepatic lobe lesion is most likely a cyst at 2 mm. Normal gallbladder, without biliary ductal dilatation. Pancreas: An area of soft tissue fullness and restricted diffusion is identified within the pancreatic neck, corresponding to the abnormality on CT.  This measures on the order of 3.9 x 3.6 cm on image 76/12. Also 3.6 x 3.6 cm on image 35/31. Results in pancreatic duct dilatation within the upstream body and tail on image 17/11. Involves the celiac and its branches including on image 75/12. Spleen:  Grossly within normal limits. Adrenals/Urinary Tract: Normal adrenal glands. Grossly normal kidneys, without hydronephrosis. Stomach/Bowel: Gastric distension with proximal wall and fold thickening, including on image 73/12. Grossly normal abdominal bowel loops. Vascular/Lymphatic: Normal caliber of the aorta. 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 Limited evaluation for abdominal adenopathy. Other:  Small volume abdominal ascites. Musculoskeletal: No acute osseous abnormality. IMPRESSION: 1. Moderate motion and susceptibility artifact degradation throughout. 2. Confirmation of a pancreatic neck mass, most consistent with adenocarcinoma. Involvement of the celiac and its branches, most consistent with a non-operative lesion. 3. Consider endoscopic ultrasound sampling. 4. Gastric wall and fold thickening, suggesting gastritis. 5. Small bilateral pleural effusions with adjacent airspace disease. Electronically Signed   By: Abigail Miyamoto M.D.   On: 09/12/2018 03:04   Ir Angiogram Visceral Selective  Result Date: 09/07/2018 INDICATION: 57 year old male with large volume upper GI bleed secondary to a very large duodenal ulcer. Patient underwent endoscopic intervention but continues to require pressors and large volume transfusion. He presents for mesenteric arteriography and embolization of any active bleeding and prophylactic embolization of the gastroduodenal artery. EXAM: IR ULTRASOUND GUIDANCE VASC ACCESS RIGHT; ADDITIONAL ARTERIOGRAPHY; IR EMBO ART VEN HEMORR LYMPH EXTRAV INC GUIDE ROADMAPPING; SELECTIVE VISCERAL ARTERIOGRAPHY 1. Ultrasound-guided vascular access 2. Catheterization of the left gastric artery where it arises directly from the aorta with arteriogram 3. Catheterization of the celiac artery with arteriogram 4. Catheterization of the common hepatic artery with arteriogram 5. Catheterization of the gastroduodenal artery with arteriogram 6. Coil embolization of the gastroduodenal artery 7. Catheterization of the superior mesenteric artery with arteriogram MEDICATIONS: None ANESTHESIA/SEDATION: Patient intubated and on propofol drip CONTRAST:  70 mL Isovue 370 FLUOROSCOPY TIME:  Fluoroscopy Time: 11 minutes 30 seconds (487 mGy). COMPLICATIONS: None immediate. PROCEDURE: Informed consent was obtained from the patient following explanation of the procedure, risks, benefits and alternatives. The patient understands, agrees and consents for the procedure. All questions were addressed. A time out was performed prior to the initiation of the procedure. Maximal barrier sterile technique utilized including caps, mask, sterile gowns, sterile gloves, large sterile drape, hand hygiene, and Betadine prep. The right common femoral artery was interrogated with ultrasound and found to be widely patent. An image was obtained and stored for the medical record. Local anesthesia was attained by infiltration with 1% lidocaine. A small  dermatotomy was made. Under real-time sonographic guidance, the vessel was punctured with a 21 gauge micropuncture needle. Using standard technique, the initial micro needle was exchanged over a 0.018 micro wire for a transitional 4 [place] micro sheath. The micro sheath was then exchanged over a 0.035 wire for a 5 French vascular sheath. A C2 cobra catheter was advanced in the abdominal aorta over a Bentson wire. The catheter was used to select the first vessel arising from the ventral aorta. Contrast was injected. This is a left gastric artery arising directly from the aorta. Anatomy is normal. No evidence of active bleeding. The Cobra catheter was then used to select the origin of the celiac artery. Arteriography was performed. The splenic artery is small in caliber. The hepatic artery demonstrates normal hepatic arterial anatomy. The gastroduodenal artery is slightly irregular with spasm in the proximal segment. No evidence of active extravasation. A  glidewire was successfully advanced into the common hepatic artery and the C2 cobra catheter was advanced into the common hepatic artery. Additional arteriography was performed in multiple obliquities. Again, the proximal gastroduodenal artery is irregular and spasmodic. No evidence of dissection, aneurysm or active bleeding. A Lantern microcatheter was then advanced over a Fathom 16 wire and used to select the distal aspect of the gastroduodenal artery. Arteriography was performed. The gastroepiploic arteries are widely patent. No evidence of active bleeding. Coil embolization was then performed using a 4 mm Ruby POD detachable micro coil followed by a 45 cm Ruby packing coil. Post embolization arteriography demonstrates complete occlusion of the vessel with stasis in the proximal segment. The Lantern microcatheter was brought back into the common hepatic artery and arteriography was performed. Again, complete occlusion of the gastroduodenal artery. The right  gastric artery is visualized. No evidence of active bleeding from the right gastric artery. The microcatheter was removed. The C2 cobra catheter was used to select the superior mesenteric artery. Arteriography was performed. Patent pancreaticoduodenal arcade supplying the gastroepiploic artery. No evidence of active hemorrhage. The C2 cobra catheter was removed. A limited right common femoral arteriogram was performed confirming common femoral arterial access. Hemostasis was attained with the assistance of a Cordis ExoSeal extra arterial vascular plug. IMPRESSION: 1. No evidence of active hemorrhage. 2. 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 3. Prophylactic coil embolization of the gastroduodenal artery. Signed, Criselda Peaches, MD, [facility]iology Electronically Signed   By: Jacqulynn Cadet M.D.   On: 09/07/2018 22:32   Ir Angiogram Visceral Selective  Result Date: 09/07/2018 INDICATION: 57 year old male with large volume upper GI bleed secondary to a very large duodenal ulcer. Patient underwent endoscopic intervention but continues to require pressors and large volume transfusion. He presents for mesenteric arteriography and embolization of any active bleeding and prophylactic embolization of the gastroduodenal artery. EXAM: IR ULTRASOUND GUIDANCE VASC ACCESS RIGHT; ADDITIONAL ARTERIOGRAPHY; IR EMBO ART VEN HEMORR LYMPH EXTRAV INC GUIDE ROADMAPPING; SELECTIVE VISCERAL ARTERIOGRAPHY 1. Ultrasound-guided vascular access 2. Catheterization of the left gastric artery where it arises directly from the aorta with arteriogram 3. Catheterization of the celiac artery with arteriogram 4. Catheterization of the common hepatic artery with arteriogram 5. Catheterization of the gastroduodenal artery with arteriogram 6. Coil embolization of the gastroduodenal artery 7. Catheterization of the superior mesenteric artery with  arteriogram MEDICATIONS: None ANESTHESIA/SEDATION: Patient intubated and on propofol drip CONTRAST:  70 mL Isovue 370 FLUOROSCOPY TIME:  Fluoroscopy Time: 11 minutes 30 seconds (487 mGy). COMPLICATIONS: None immediate. PROCEDURE: Informed consent was obtained from the patient following explanation of the procedure, risks, benefits and alternatives. The patient understands, agrees and consents for the procedure. All questions were addressed. A time out was performed prior to the initiation of the procedure. Maximal barrier sterile technique utilized including caps, mask, sterile gowns, sterile gloves, large sterile drape, hand hygiene, and Betadine prep. The right common femoral artery was interrogated with ultrasound and found to be widely patent. An image was obtained and stored for the medical record. Local anesthesia was attained by infiltration with 1% lidocaine. A small dermatotomy was made. Under real-time sonographic guidance, the vessel was punctured with a 21 gauge micropuncture needle. Using standard technique, the initial micro needle was exchanged over a 0.018 micro wire for a transitional 4 [place] micro sheath. The micro sheath was then exchanged over a 0.035 wire for a 5 French vascular sheath. A C2 cobra catheter was advanced in  the abdominal aorta over a Bentson wire. The catheter was used to select the first vessel arising from the ventral aorta. Contrast was injected. This is a left gastric artery arising directly from the aorta. Anatomy is normal. No evidence of active bleeding. The Cobra catheter was then used to select the origin of the celiac artery. Arteriography was performed. The splenic artery is small in caliber. The hepatic artery demonstrates normal hepatic arterial anatomy. The gastroduodenal artery is slightly irregular with spasm in the proximal segment. No evidence of active extravasation. A glidewire was successfully advanced into the common hepatic artery and the C2 cobra catheter  was advanced into the common hepatic artery. Additional arteriography was performed in multiple obliquities. Again, the proximal gastroduodenal artery is irregular and spasmodic. No evidence of dissection, aneurysm or active bleeding. A Lantern microcatheter was then advanced over a Fathom 16 wire and used to select the distal aspect of the gastroduodenal artery. Arteriography was performed. The gastroepiploic arteries are widely patent. No evidence of active bleeding. Coil embolization was then performed using a 4 mm Ruby POD detachable micro coil followed by a 45 cm Ruby packing coil. Post embolization arteriography demonstrates complete occlusion of the vessel with stasis in the proximal segment. The Lantern microcatheter was brought back into the common hepatic artery and arteriography was performed. Again, complete occlusion of the gastroduodenal artery. The right gastric artery is visualized. No evidence of active bleeding from the right gastric artery. The microcatheter was removed. The C2 cobra catheter was used to select the superior mesenteric artery. Arteriography was performed. Patent pancreaticoduodenal arcade supplying the gastroepiploic artery. No evidence of active hemorrhage. The C2 cobra catheter was removed. A limited right common femoral arteriogram was performed confirming common femoral arterial access. Hemostasis was attained with the assistance of a Cordis ExoSeal extra arterial vascular plug. IMPRESSION: 1. No evidence of active hemorrhage. 2. 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 3. Prophylactic coil embolization of the gastroduodenal artery. Signed, Criselda Peaches, MD, [place] [facility] Radiology Electronically Signed   By: Jacqulynn Cadet M.D.   On: 09/07/2018 22:32   Ir Angiogram Selective Each Additional Vessel  Result Date: 09/07/2018 INDICATION: 57 year old male with large volume upper GI bleed  secondary to a very large duodenal ulcer. Patient underwent endoscopic intervention but continues to require pressors and large volume transfusion. He presents for mesenteric arteriography and embolization of any active bleeding and prophylactic embolization of the gastroduodenal artery. EXAM: IR ULTRASOUND GUIDANCE VASC ACCESS RIGHT; ADDITIONAL ARTERIOGRAPHY; IR EMBO ART VEN HEMORR LYMPH EXTRAV INC GUIDE ROADMAPPING; SELECTIVE VISCERAL ARTERIOGRAPHY 1. Ultrasound-guided vascular access 2. Catheterization of the left gastric artery where it arises directly from the aorta with arteriogram 3. Catheterization of the celiac artery with arteriogram 4. Catheterization of the common hepatic artery with arteriogram 5. Catheterization of the gastroduodenal artery with arteriogram 6. Coil embolization of the gastroduodenal artery 7. Catheterization of the superior mesenteric artery with arteriogram MEDICATIONS: None ANESTHESIA/SEDATION: Patient intubated and on propofol drip CONTRAST:  70 mL Isovue 370 FLUOROSCOPY TIME:  Fluoroscopy Time: 11 minutes 30 seconds (487 mGy). COMPLICATIONS: None immediate. PROCEDURE: Informed consent was obtained from the patient following explanation of the procedure, risks, benefits and alternatives. The patient understands, agrees and consents for the procedure. All questions were addressed. A time out was performed prior to the initiation of the procedure. Maximal barrier sterile technique utilized including caps, mask, sterile gowns, sterile gloves, large sterile drape, hand hygiene, and Betadine prep. The right  common femoral artery was interrogated with ultrasound and found to be widely patent. An image was obtained and stored for the medical record. Local anesthesia was attained by infiltration with 1% lidocaine. A small dermatotomy was made. Under real-time sonographic guidance, the vessel was punctured with a 21 gauge micropuncture needle. Using standard technique, the initial micro  needle was exchanged over a 0.018 micro wire for a transitional 4 [place] micro sheath. The micro sheath was then exchanged over a 0.035 wire for a 5 French vascular sheath. A C2 cobra catheter was advanced in the abdominal aorta over a Bentson wire. The catheter was used to select the first vessel arising from the ventral aorta. Contrast was injected. This is a left gastric artery arising directly from the aorta. Anatomy is normal. No evidence of active bleeding. The Cobra catheter was then used to select the origin of the celiac artery. Arteriography was performed. The splenic artery is small in caliber. The hepatic artery demonstrates normal hepatic arterial anatomy. The gastroduodenal artery is slightly irregular with spasm in the proximal segment. No evidence of active extravasation. A glidewire was successfully advanced into the common hepatic artery and the C2 cobra catheter was advanced into the common hepatic artery. Additional arteriography was performed in multiple obliquities. Again, the proximal gastroduodenal artery is irregular and spasmodic. No evidence of dissection, aneurysm or active bleeding. A Lantern microcatheter was then advanced over a Fathom 16 wire and used to select the distal aspect of the gastroduodenal artery. Arteriography was performed. The gastroepiploic arteries are widely patent. No evidence of active bleeding. Coil embolization was then performed using a 4 mm Ruby POD detachable micro coil followed by a 45 cm Ruby packing coil. Post embolization arteriography demonstrates complete occlusion of the vessel with stasis in the proximal segment. The Lantern microcatheter was brought back into the common hepatic artery and arteriography was performed. Again, complete occlusion of the gastroduodenal artery. The right gastric artery is visualized. No evidence of active bleeding from the right gastric artery. The microcatheter was removed. The C2 cobra catheter was used to select the  superior mesenteric artery. Arteriography was performed. Patent pancreaticoduodenal arcade supplying the gastroepiploic artery. No evidence of active hemorrhage. The C2 cobra catheter was removed. A limited right common femoral arteriogram was performed confirming common femoral arterial access. Hemostasis was attained with the assistance of a Cordis ExoSeal extra arterial vascular plug. IMPRESSION: 1. No evidence of active hemorrhage. 2. 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 3. Prophylactic coil embolization of the gastroduodenal artery. Signed, Criselda Peaches, MD, [place] [facility] Radiology Electronically Signed   By: Jacqulynn Cadet M.D.   On: 09/07/2018 22:32   Ir Angiogram Selective Each Additional Vessel  Result Date: 09/07/2018 INDICATION: 57 year old male with large volume upper GI bleed secondary to a very large duodenal ulcer. Patient underwent endoscopic intervention but continues to require pressors and large volume transfusion. He presents for mesenteric arteriography and embolization of any active bleeding and prophylactic embolization of the gastroduodenal artery. EXAM: IR ULTRASOUND GUIDANCE VASC ACCESS RIGHT; ADDITIONAL ARTERIOGRAPHY; IR EMBO ART VEN HEMORR LYMPH EXTRAV INC GUIDE ROADMAPPING; SELECTIVE VISCERAL ARTERIOGRAPHY 1. Ultrasound-guided vascular access 2. Catheterization of the left gastric artery where it arises directly from the aorta with arteriogram 3. Catheterization of the celiac artery with arteriogram 4. Catheterization of the common hepatic artery with arteriogram 5. Catheterization of the gastroduodenal artery with arteriogram 6. Coil embolization of the gastroduodenal artery 7. Catheterization of the superior mesenteric artery  with arteriogram MEDICATIONS: None ANESTHESIA/SEDATION: Patient intubated and on propofol drip CONTRAST:  70 mL Isovue 370 FLUOROSCOPY TIME:  Fluoroscopy Time: 11  minutes 30 seconds (487 mGy). COMPLICATIONS: None immediate. PROCEDURE: Informed consent was obtained from the patient following explanation of the procedure, risks, benefits and alternatives. The patient understands, agrees and consents for the procedure. All questions were addressed. A time out was performed prior to the initiation of the procedure. Maximal barrier sterile technique utilized including caps, mask, sterile gowns, sterile gloves, large sterile drape, hand hygiene, and Betadine prep. The right common femoral artery was interrogated with ultrasound and found to be widely patent. An image was obtained and stored for the medical record. Local anesthesia was attained by infiltration with 1% lidocaine. A small dermatotomy was made. Under real-time sonographic guidance, the vessel was punctured with a 21 gauge micropuncture needle. Using standard technique, the initial micro needle was exchanged over a 0.018 micro wire for a transitional 4 [place] micro sheath. The micro sheath was then exchanged over a 0.035 wire for a 5 French vascular sheath. A C2 cobra catheter was advanced in the abdominal aorta over a Bentson wire. The catheter was used to select the first vessel arising from the ventral aorta. Contrast was injected. This is a left gastric artery arising directly from the aorta. Anatomy is normal. No evidence of active bleeding. The Cobra catheter was then used to select the origin of the celiac artery. Arteriography was performed. The splenic artery is small in caliber. The hepatic artery demonstrates normal hepatic arterial anatomy. The gastroduodenal artery is slightly irregular with spasm in the proximal segment. No evidence of active extravasation. A glidewire was successfully advanced into the common hepatic artery and the C2 cobra catheter was advanced into the common hepatic artery. Additional arteriography was performed in multiple obliquities. Again, the proximal gastroduodenal artery is  irregular and spasmodic. No evidence of dissection, aneurysm or active bleeding. A Lantern microcatheter was then advanced over a Fathom 16 wire and used to select the distal aspect of the gastroduodenal artery. Arteriography was performed. The gastroepiploic arteries are widely patent. No evidence of active bleeding. Coil embolization was then performed using a 4 mm Ruby POD detachable micro coil followed by a 45 cm Ruby packing coil. Post embolization arteriography demonstrates complete occlusion of the vessel with stasis in the proximal segment. The Lantern microcatheter was brought back into the common hepatic artery and arteriography was performed. Again, complete occlusion of the gastroduodenal artery. The right gastric artery is visualized. No evidence of active bleeding from the right gastric artery. The microcatheter was removed. The C2 cobra catheter was used to select the superior mesenteric artery. Arteriography was performed. Patent pancreaticoduodenal arcade supplying the gastroepiploic artery. No evidence of active hemorrhage. The C2 cobra catheter was removed. A limited right common femoral arteriogram was performed confirming common femoral arterial access. Hemostasis was attained with the assistance of a Cordis ExoSeal extra arterial vascular plug. IMPRESSION: 1. No evidence of active hemorrhage. 2. 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 3. Prophylactic coil embolization of the gastroduodenal artery. Signed, Criselda Peaches, MD, [place] [facility] Radiology Electronically Signed   By: Jacqulynn Cadet M.D.   On: 09/07/2018 22:32   Ir US Guide Vasc Access Right  Result Date: 09/07/2018 INDICATION: 57 year old male with large volume upper GI bleed secondary to a very large duodenal ulcer. Patient underwent endoscopic intervention but continues to require pressors and large volume transfusion. He presents for mesenteric  arteriography and embolization of any active bleeding and prophylactic embolization of the gastroduodenal artery. EXAM: IR ULTRASOUND GUIDANCE VASC ACCESS RIGHT; ADDITIONAL ARTERIOGRAPHY; IR EMBO ART VEN HEMORR LYMPH EXTRAV INC GUIDE ROADMAPPING; SELECTIVE VISCERAL ARTERIOGRAPHY 1. Ultrasound-guided vascular access 2. Catheterization of the left gastric artery where it arises directly from the aorta with arteriogram 3. Catheterization of the celiac artery with arteriogram 4. Catheterization of the common hepatic artery with arteriogram 5. Catheterization of the gastroduodenal artery with arteriogram 6. Coil embolization of the gastroduodenal artery 7. Catheterization of the superior mesenteric artery with arteriogram MEDICATIONS: None ANESTHESIA/SEDATION: Patient intubated and on propofol drip CONTRAST:  70 mL Isovue 370 FLUOROSCOPY TIME:  Fluoroscopy Time: 11 minutes 30 seconds (487 mGy). COMPLICATIONS: None immediate. PROCEDURE: Informed consent was obtained from the patient following explanation of the procedure, risks, benefits and alternatives. The patient understands, agrees and consents for the procedure. All questions were addressed. A time out was performed prior to the initiation of the procedure. Maximal barrier sterile technique utilized including caps, mask, sterile gowns, sterile gloves, large sterile drape, hand hygiene, and Betadine prep. The right common femoral artery was interrogated with ultrasound and found to be widely patent. An image was obtained and stored for the medical record. Local anesthesia was attained by infiltration with 1% lidocaine. A small dermatotomy was made. Under real-time sonographic guidance, the vessel was punctured with a 21 gauge micropuncture needle. Using standard technique, the initial micro needle was exchanged over a 0.018 micro wire for a transitional 4 [place] micro sheath. The micro sheath was then exchanged over a 0.035 wire for a 5 French vascular sheath. A C2  cobra catheter was advanced in the abdominal aorta over a Bentson wire. The catheter was used to select the first vessel arising from the ventral aorta. Contrast was injected. This is a left gastric artery arising directly from the aorta. Anatomy is normal. No evidence of active bleeding. The Cobra catheter was then used to select the origin of the celiac artery. Arteriography was performed. The splenic artery is small in caliber. The hepatic artery demonstrates normal hepatic arterial anatomy. The gastroduodenal artery is slightly irregular with spasm in the proximal segment. No evidence of active extravasation. A glidewire was successfully advanced into the common hepatic artery and the C2 cobra catheter was advanced into the common hepatic artery. Additional arteriography was performed in multiple obliquities. Again, the proximal gastroduodenal artery is irregular and spasmodic. No evidence of dissection, aneurysm or active bleeding. A Lantern microcatheter was then advanced over a Fathom 16 wire and used to select the distal aspect of the gastroduodenal artery. Arteriography was performed. The gastroepiploic arteries are widely patent. No evidence of active bleeding. Coil embolization was then performed using a 4 mm Ruby POD detachable micro coil followed by a 45 cm Ruby packing coil. Post embolization arteriography demonstrates complete occlusion of the vessel with stasis in the proximal segment. The Lantern microcatheter was brought back into the common hepatic artery and arteriography was performed. Again, complete occlusion of the gastroduodenal artery. The right gastric artery is visualized. No evidence of active bleeding from the right gastric artery. The microcatheter was removed. The C2 cobra catheter was used to select the superior mesenteric artery. Arteriography was performed. Patent pancreaticoduodenal arcade supplying the gastroepiploic artery. No evidence of active hemorrhage. The C2 cobra catheter  was removed. A limited right common femoral arteriogram was performed confirming common femoral arterial access. Hemostasis was attained with the assistance of a Cordis ExoSeal extra arterial vascular plug.  IMPRESSION: 1. No evidence of active hemorrhage. 2. 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 3. Prophylactic coil embolization of the gastroduodenal artery. Signed, Criselda Peaches, MD, [place] [facility] Radiology Electronically Signed   By: Jacqulynn Cadet M.D.   On: 09/07/2018 22:32   Dg Chest Port 1 View  Result Date: 09/09/2018 CLINICAL DATA:  Respiratory failure EXAM: PORTABLE CHEST 1 VIEW COMPARISON:  09/07/2018 FINDINGS: Endotracheal tube and nasogastric catheter have been removed in the interval. Left jugular central line is again noted in the distal superior vena cava. The cardiac shadow is stable. The lungs are well aerated bilaterally with patchy bibasilar infiltrates and small left pleural effusion. IMPRESSION: Stable bibasilar infiltrates and small left pleural effusion. Electronically Signed   By: Inez Catalina M.D.   On: 09/09/2018 07:17   Dg Chest Port 1 View  Result Date: 09/07/2018 CLINICAL DATA:  Status post fasciotomy and hematoma back UA shins in the right lower leg 2 days ago. Intubated patient. EXAM: PORTABLE CHEST 1 VIEW COMPARISON:  CT scan of the chest of September 06, 2018 FINDINGS: The lungs are well-expanded. There are patchy increased densities at both bases which are slightly more conspicuous today. There is a small right and and somewhat larger left pleural effusion. The heart and pulmonary vascularity are normal. The endotracheal tube tip projects 4.1 cm above the carina. The esophagogastric tube tip in proximal port project below the GE junction. The left internal jugular venous catheter tip projects over the midportion of the SVC. IMPRESSION: Bibasilar atelectasis or pneumonia. Small right  pleural effusion and somewhat larger left pleural effusion. The support tubes are in reasonable position. Electronically Signed   By: David  Martinique M.D.   On: 09/07/2018 13:27   Vas [place] Burnard Bunting With/wo Tbi  Result Date: 09/15/2018 LOWER EXTREMITY DOPPLER STUDY Indications: Peripheral artery disease.  Vascular Interventions: Status post right popliteal - tibial embolectomy and                         fasciotomy. Comparison Study: No exam for comparison Performing Technologist: Birdena Crandall, [place] [facility]  Examination Guidelines: A complete evaluation includes at minimum, Doppler waveform signals and systolic blood pressure reading at the level of bilateral brachial, anterior tibial, and posterior tibial arteries, when vessel segments are accessible. Bilateral testing is considered an integral part of a complete examination. Photoelectric Plethysmograph (PPG) waveforms and toe systolic pressure readings are included as required and additional duplex testing as needed. Limited examinations for reoccurring indications may be performed as noted.  ABI Findings: +--------+------------------+-----+----------+--------+ Right   Rt Pressure (mmHg)IndexWaveform  Comment  +--------+------------------+-----+----------+--------+ [id number]                    triphasic          +--------+------------------+-----+----------+--------+ PTA     179               1.36 triphasic          +--------+------------------+-----+----------+--------+ DP      152               1.15 monophasic         +--------+------------------+-----+----------+--------+ +--------+------------------+-----+---------+-------+ Left    Lt Pressure (mmHg)IndexWaveform Comment +--------+------------------+-----+---------+-------+ [id number]                    triphasic        +--------+------------------+-----+---------+-------+ PTA     176  1.33 triphasic        +--------+------------------+-----+---------+-------+  DP      162               1.23 triphasic        +--------+------------------+-----+---------+-------+ +-------+-----------+-----------+------------+------------+ ABI/TBIToday's ABIToday's TBIPrevious ABIPrevious TBI +-------+-----------+-----------+------------+------------+ Right  1.36                                           +-------+-----------+-----------+------------+------------+ Left   1.33                                           +-------+-----------+-----------+------------+------------+  Summary: Right: Resting right ankle-brachial index indicates noncompressible right lower extremity arteries. Left: Resting left ankle-brachial index indicates noncompressible left lower extremity arteries.  *See table(s) above for measurements and observations.  Electronically signed by Harold Barban MD on 09/15/2018 at 1:50:47 PM.   Final    Ct Renal Stone Study  Result Date: 08/22/2018 CLINICAL DATA:  Left flank pain. Nausea vomiting for 4 days. Previous appendectomy. EXAM: CT ABDOMEN AND PELVIS WITHOUT CONTRAST TECHNIQUE: Multidetector CT imaging of the abdomen and pelvis was performed following the standard protocol without IV contrast. COMPARISON:  CT scan December 19, 2017 FINDINGS: Lower chest: No acute abnormality. Hepatobiliary: No focal liver abnormality is seen. No gallstones, gallbladder wall thickening, or biliary dilatation. Pancreas: Unremarkable. No pancreatic ductal dilatation or surrounding inflammatory changes. Spleen: Normal in size without focal abnormality. Adrenals/Urinary Tract: Adrenal glands are normal. A 3 mm stone is again noted in the mid right kidney. No hydronephrosis or perinephric stranding. Bilateral extrarenal pelvises. No ureterectasis or ureteral stones noted. The bladder is unremarkable. Stomach/Bowel: The stomach is distended. Small bowel is normal. Moderate to severe fecal loading throughout the colon with a stool ball in the rectum. By report, the patient  is status post appendectomy. 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 Atherosclerotic changes seen in the aorta, iliac vessels, and femoral vessels. Reproductive: Prostate is unremarkable. Other: No abdominal wall hernia or abnormality. No abdominopelvic ascites. Musculoskeletal: No acute or significant osseous findings. IMPRESSION: 1. Nonobstructive 3 mm stone in the right kidney. No renal obstruction or other stones noted. 2. Gastric distention of uncertain etiology. 3. Moderate to severe fecal loading throughout the colon. 4. Atherosclerotic changes throughout the abdominal aorta and iliac vessels. 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 Recommend followup by ultrasound in 3 years. This recommendation follows ACR consensus guidelines: White Paper of the ACR Incidental Findings Committee II on Vascular Findings. Joellyn Rued Radiol 2013; 57:846-962 Electronically Signed   By: Dorise Bullion III M.D   On: 08/22/2018 19:54   Ir Embo Art  [place] Guide Roadmapping  Result Date: 09/07/2018 INDICATION: 57 year old male with large volume upper GI bleed secondary to a very large duodenal ulcer. Patient underwent endoscopic intervention but continues to require pressors and large volume transfusion. He presents for mesenteric arteriography and embolization of any active bleeding and prophylactic embolization of  the gastroduodenal artery. EXAM: IR ULTRASOUND GUIDANCE VASC ACCESS RIGHT; ADDITIONAL ARTERIOGRAPHY; IR EMBO ART VEN HEMORR LYMPH EXTRAV INC GUIDE ROADMAPPING; SELECTIVE VISCERAL ARTERIOGRAPHY 1. Ultrasound-guided vascular access 2. Catheterization of the left gastric  artery where it arises directly from the aorta with arteriogram 3. Catheterization of the celiac artery with arteriogram 4. Catheterization of the common hepatic artery with arteriogram 5. Catheterization of the gastroduodenal artery with arteriogram 6. Coil embolization of the gastroduodenal artery 7. Catheterization of the superior mesenteric artery with arteriogram MEDICATIONS: None ANESTHESIA/SEDATION: Patient intubated and on propofol drip CONTRAST:  70 mL Isovue 370 FLUOROSCOPY TIME:  Fluoroscopy Time: 11 minutes 30 seconds (487 mGy). COMPLICATIONS: None immediate. PROCEDURE: Informed consent was obtained from the patient following explanation of the procedure, risks, benefits and alternatives. The patient understands, agrees and consents for the procedure. All questions were addressed. A time out was performed prior to the initiation of the procedure. Maximal barrier sterile technique utilized including caps, mask, sterile gowns, sterile gloves, large sterile drape, hand hygiene, and Betadine prep. The right common femoral artery was interrogated with ultrasound and found to be widely patent. An image was obtained and stored for the medical record. Local anesthesia was attained by infiltration with 1% lidocaine. A small dermatotomy was made. Under real-time sonographic guidance, the vessel was punctured with a 21 gauge micropuncture needle. Using standard technique, the initial micro needle was exchanged over a 0.018 micro wire for a transitional 4 [place] micro sheath. The micro sheath was then exchanged over a 0.035 wire for a 5 French vascular sheath. A C2 cobra catheter was advanced in the abdominal aorta over a Bentson wire. The catheter was used to select the first vessel arising from the ventral aorta. Contrast was injected. This is a left gastric artery arising directly from the aorta. Anatomy is normal. No evidence of active bleeding. The Cobra catheter was then used to select the origin of the celiac  artery. Arteriography was performed. The splenic artery is small in caliber. The hepatic artery demonstrates normal hepatic arterial anatomy. The gastroduodenal artery is slightly irregular with spasm in the proximal segment. No evidence of active extravasation. A glidewire was successfully advanced into the common hepatic artery and the C2 cobra catheter was advanced into the common hepatic artery. Additional arteriography was performed in multiple obliquities. Again, the proximal gastroduodenal artery is irregular and spasmodic. No evidence of dissection, aneurysm or active bleeding. A Lantern microcatheter was then advanced over a Fathom 16 wire and used to select the distal aspect of the gastroduodenal artery. Arteriography was performed. The gastroepiploic arteries are widely patent. No evidence of active bleeding. Coil embolization was then performed using a 4 mm Ruby POD detachable micro coil followed by a 45 cm Ruby packing coil. Post embolization arteriography demonstrates complete occlusion of the vessel with stasis in the proximal segment. The Lantern microcatheter was brought back into the common hepatic artery and arteriography was performed. Again, complete occlusion of the gastroduodenal artery. The right gastric artery is visualized. No evidence of active bleeding from the right gastric artery. The microcatheter was removed. The C2 cobra catheter was used to select the superior mesenteric artery. Arteriography was performed. Patent pancreaticoduodenal arcade supplying the gastroepiploic artery. No evidence of active hemorrhage. The C2 cobra catheter was removed. A limited right common femoral arteriogram was performed confirming common femoral arterial access. Hemostasis was attained with the assistance of a Cordis ExoSeal extra arterial vascular plug. IMPRESSION: 1. No evidence of active hemorrhage. 2. Irregular/spastic segment of  the proximal gastroduodenal artery. 3. Prophylactic coil embolization  of the gastroduodenal artery. Signed, Criselda Peaches, MD, [facility] Electronically Signed   By: Jacqulynn Cadet M.D.   On: 09/07/2018 22:32   Ct Angio Abd/pel W/ And/or W/o  Result Date: 09/11/2018 CLINICAL DATA:  Status postoperative right popliteal and tibial embolectomy for embolic disease. Known bilateral iliac artery aneurysmal disease. EXAM: CT ANGIOGRAPHY ABDOMEN AND PELVIS WITH CONTRAST TECHNIQUE: Multidetector CT imaging of the abdomen and pelvis was performed using the standard protocol during bolus administration of intravenous contrast. Multiplanar reconstructed images and MIPs were obtained and reviewed to evaluate the vascular anatomy. CONTRAST:  164m ISOVUE-370 IOPAMIDOL (ISOVUE-370) INJECTION 76% COMPARISON:  CTA of the abdomen on 01/30/2018 and CT of the abdomen and pelvis with contrast on 12/19/2017. FINDINGS: VASCULAR Aorta: Stable mild dilatation of the infrarenal aorta with maximal diameter of 3.1 cm. Celiac: Mild origin stenosis of 25-30%. Distal branches are patent. Status post transcatheter coil embolization of the gastroduodenal artery on 09/07/2018. Hepatic arteries are normally patent. SMA: Normally patent. Renals: Bilateral single renal arteries demonstrate normal patency. IMA: Normally patent. Inflow: Fusiform aneurysmal disease present of bilateral common iliac arteries as previously demonstrated. Maximal caliber of the right common iliac artery is 2.8-2.9 cm and maximal caliber of the right common iliac artery is 2.7 cm. These measurements are stable since prior studies. The significant change since the prior studies is a much more prominent and new component of mural thrombus in both common iliac arteries, right greater than left. In the proximal right common iliac artery, mural thrombus narrows the aneurysmal lumen by at least 50% in its narrowest portion. Eccentric mural thrombus is present at the origin  of the right common iliac artery and also distally just prior to the common iliac bifurcation without significant luminal stenosis. Aneurysmal disease again noted of bilateral internal iliac arteries with the left measuring approximately 2.5 cm in greatest diameter and the right measuring 1.5 cm. Both internal iliac arteries also demonstrate significant mural thrombus without occlusion. Mural thrombus is stable to slightly more prominent compared to the 12/19/2017 study. Bilateral external iliac arteries are normally patent and demonstrate diffuse arteriomegaly with maximum caliber 1.5 cm. Proximal Outflow: Bilateral common femoral arteries demonstrate dilatation with the left measuring up to 2.1 cm in diameter. Mural thrombus is present along the posterior lumen with approximately 50% reduction in luminal diameter. No thrombus is seen extending into proximal SFA or profunda femoral arteries. On the right side the common femoral artery measures up to 1.8 cm in the femoral bifurcation appears widely patent. Veins: Delayed venous phase imaging shows patent venous structures without evidence of thrombus. There is some probable mass effect of ectatic external iliac arteries on the distal aspects of bilateral external iliac veins without evidence of associated deep venous thrombosis or significant collateral vein formation. Review of the MIP images confirms the above findings. NON-VASCULAR Lower chest: Small bilateral pleural effusions with bibasilar atelectasis. Hepatobiliary: No focal liver abnormality is seen. No gallstones, gallbladder wall thickening, or biliary dilatation. Pancreas: Since the March study, there is a change in appearance of the pancreas on the venous phase of imaging. The tail of the pancreas now appears progressively atrophic with associated pancreatic ductal dilatation up to approximately 7 mm. At the junction of the body and head of the pancreas, there is suggestion a potential ill-defined  low-attenuation mass that could measure as much as 3 cm in AP diameter. Borders are very difficult  to delineate by CT. There is no associated biliary obstruction. 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 Recommend eventual correlation with MRI of the abdomen with and without gadolinium. There may be a window of time recommended to wait to perform MRI after placement of the gastroduodenal artery Ruby coils. This can be further investigated based on manufacturer recommendation. If there may be a significant delay in ability to perform MRI, consider endoscopic ultrasound to investigate the pancreas. Spleen: Normal in size without focal abnormality. Adrenals/Urinary Tract: Adrenal glands are unremarkable. Kidneys are normal, without renal calculi, focal lesion, or hydronephrosis. Bladder is unremarkable. Stomach/Bowel: Gastric fold thickening and prominent fold thickening of the duodenum may be consistent with known peptic ulcer disease. No evidence of bowel obstruction or free intraperitoneal air. No focal abscess identified. Lymphatic: No enlarged lymph nodes identified. Reproductive: Prostate is unremarkable. Other: Diffuse anasarca of the body wall and edema of the mesenteric and retroperitoneal fat likely relate to low albumin and malnutrition. Musculoskeletal: Moderate degenerative disc disease at L5-S1. IMPRESSION: VASCULAR 1. 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 No complications evident after embolization. 2. Stable mild aneurysmal disease of the distal abdominal aorta measuring 3.1 cm. 3. 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 The major change since prior imaging is significant increase in mural thrombus within the dilated common iliac arteries bilaterally, especially on the right. This is most likely the source distal emboli to the right distal leg. Mural thrombus in the internal iliac artery aneurysms is stable to  slightly more prominent. 4. Stable diffuse enlargement bilateral external iliac and common femoral arteries. The left common femoral artery does contain significant posterior wall mural thrombus. NON-VASCULAR 1. The most significant nonvascular finding is potential development of a mass of the pancreas at the juncture of the head and body causing progressive atrophy of the tail of the pancreas with associated pancreatic ductal dilatation. On the venous phase of imaging, delineation of the potential mass is quite vague but the region of mass may measure as much as 3 cm. There may be some issue with immediate performance of MRI in the setting recent coiling of the gastroduodenal artery even if the coils are MRI compatible. This can be checked with the manufacturer recommendation. If MRI can be performed immediately, an MRI of the abdomen with and without gadolinium would be helpful for further evaluation. If there is going to be significant delay, consider EUS characterization of the pancreas. 2. Diffuse body wall anasarca and edema of the mesenteric fat and retroperitoneum likely reflects low albumin and malnutrition. 3. Prominent gastric folds and duodenal folds without evidence of bowel obstruction or perforation. Electronically Signed   By: Aletta Edouard M.D.   On: 09/11/2018 12:36     LOS: 17 days   Signature  Lala Lund M.D on 09/20/2018 at 10:34 AM   -  To page go to [email or web address] - password [facility]

## 2018-09-20 NOTE — [place] FL2 (Signed)
[place] MEDICAID FL2 LEVEL OF CARE SCREENING TOOL     IDENTIFICATION  Patient Name: Ryan Medina Birthdate: 11-Feb-1961 Sex: male Admission Date (Current Location): 09/03/2018  [facility] and [place] Number:  Herbalist and Address:  [facility] [place] [facility]. [facility], [place] [address], [place], [place] [address]      Provider Number: [id number]  Attending Physician Name and Address:  Thurnell Lose, MD  Relative Name and Phone Number:  [name], sister [phone]    Current Level of Care: Hospital Recommended Level of Care: [place] Prior Approval Number:    Date Approved/Denied:   PASRR Number: [id number]  Discharge Plan: SNF    Current Diagnoses: Patient Active Problem List   Diagnosis Date Noted  . Duodenal ulcer with hemorrhage   . Pancreatic mass   . 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 Upper GI bleed   . Cocaine abuse ([place])   . Acute blood loss anemia   . Chronic pain syndrome   . Hemorrhagic shock ([place])   . 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 Ischemic foot 09/03/2018  . Right leg claudication ([place]) 09/03/2018  . PVD (peripheral vascular disease) ([place]) 09/03/2018  . HLD (hyperlipidemia) 08/22/2018  . DKA (diabetic ketoacidoses) ([place]) 08/22/2018  . AKI (acute kidney injury) ([place]) 08/22/2018  . Abdominal pain 08/22/2018  . Hyperosmolar non-ketotic state in patient with type 2 diabetes mellitus ([place]) 01/30/2018  . Chest pain 01/30/2018  . AAA (abdominal aortic aneurysm) ([place]) 01/30/2018  . Tobacco abuse 01/30/2018  . Nausea & vomiting 01/30/2018  . Diabetes mellitus due to underlying condition with hyperosmolarity without nonketotic hyperglycemic-hyperosmolar coma [facility]) ([place]) 01/30/2018  . Dilated cardiomyopathy ([place])   . Other hyperlipidemia 11/11/2016  . Abnormal nuclear stress test 05/19/2016  . Atypical angina ([place]) 04/25/2016  . DM (diabetes mellitus), type 2, uncontrolled ([place])  03/14/2015  . Essential hypertension 03/14/2015  . Hyperglycemia 01/14/2015    Orientation RESPIRATION BLADDER Height & Weight     Self, Time, Situation, Place  Normal Continent Weight: 66.7 kg Height:  5' 11"  (180.3 cm)  BEHAVIORAL SYMPTOMS/MOOD NEUROLOGICAL BOWEL NUTRITION STATUS      Continent Diet(Please see DC Summary)  AMBULATORY STATUS COMMUNICATION OF NEEDS Skin   Limited Assist Verbally Surgical wounds                       Personal Care Assistance Level of Assistance  Bathing, Feeding, Dressing Bathing Assistance: Limited assistance Feeding assistance: Independent Dressing Assistance: Limited assistance     Functional Limitations Info  Sight, Hearing, Speech Sight Info: Adequate Hearing Info: Adequate Speech Info: Adequate    SPECIAL CARE FACTORS FREQUENCY  PT (By licensed PT), OT (By licensed OT)     PT Frequency: 3x/week OT Frequency: 3x/week            Contractures Contractures Info: Not present    Additional Factors Info  Code Status, Allergies, Insulin Sliding Scale Code Status Info: Partial Allergies Info: Lisinopril   Insulin Sliding Scale Info: 3x daily with meals        Current Medications (09/20/2018):  This is the current hospital active medication list Current Facility-Administered Medications  Medication Dose Route Frequency Provider Last Rate Last Dose  . 0.9 %  sodium chloride infusion  500 mL Intravenous Once PRN Josue Hector, MD      . alum & mag hydroxide-simeth (MAALOX/MYLANTA) 200-200-20 MG/5ML suspension 30 mL  30 mL  Oral BID PRN Josue Hector, MD   30 mL at 09/17/18 0414  . atorvastatin (LIPITOR) tablet 20 mg  20 mg Oral Daily Josue Hector, MD   20 mg at 09/20/18 0901  . chlorhexidine gluconate (MEDLINE KIT) (PERIDEX) 0.12 % solution 15 mL  15 mL Mouth Rinse BID Josue Hector, MD   15 mL at 09/20/18 0857  . famotidine (PEPCID) tablet 20 mg  20 mg Oral BID PRN Josue Hector, MD   20 mg at 09/15/18 0037  .  insulin aspart (novoLOG) injection 0-5 Units  0-5 Units Subcutaneous QHS Lala Lund K, MD      . insulin aspart (novoLOG) injection 0-9 Units  0-9 Units Subcutaneous TID WC Thurnell Lose, MD   5 Units at 09/20/18 [phone]  . insulin glargine (LANTUS) injection 7 Units  7 Units Subcutaneous Daily Thurnell Lose, MD   7 Units at 09/20/18 0900  . ipratropium-albuterol (DUONEB) 0.5-2.5 (3) MG/3ML nebulizer solution 3 mL  3 mL Nebulization Q4H PRN Josue Hector, MD   3 mL at 09/16/18 0247  . lidocaine (XYLOCAINE) 2 % viscous mouth solution 15 mL  15 mL Oral Once Josue Hector, MD      . LORazepam (ATIVAN) tablet 0.5-1 mg  0.5-1 mg Oral Q6H PRN Josue Hector, MD   1 mg at 09/20/18 0503  . MEDLINE mouth rinse  15 mL Mouth Rinse BID Josue Hector, MD   15 mL at 09/19/18 2120  . morphine 2 MG/ML injection 1 mg  1 mg Intravenous Q4H PRN Josue Hector, MD   1 mg at 09/20/18 6384  . ondansetron (ZOFRAN) injection 4 mg  4 mg Intravenous Q6H PRN Josue Hector, MD   4 mg at 09/14/18 2130  . oxyCODONE-acetaminophen (PERCOCET) 7.5-325 MG per tablet 1 tablet  1 tablet Oral Q6H PRN Josue Hector, MD   1 tablet at 09/20/18 [phone]  . pantoprazole (PROTONIX) injection 40 mg  40 mg Intravenous Q12H Josue Hector, MD   40 mg at 09/20/18 0854  . polyethylene glycol (MIRALAX / GLYCOLAX) packet 17 g  17 g Oral Daily PRN Josue Hector, MD      . senna-docusate (Senokot-S) tablet 1 tablet  1 tablet Oral QHS PRN Josue Hector, MD      . sodium chloride flush (NS) 0.9 % injection 10-40 mL  10-40 mL Intracatheter PRN Josue Hector, MD   10 mL at 09/11/18 0418  . tamsulosin (FLOMAX) capsule 0.4 mg  0.4 mg Oral Daily Josue Hector, MD   0.4 mg at 09/20/18 0901     Discharge Medications: Please see discharge summary for a list of discharge medications.  Relevant Imaging Results:  Relevant Lab Results:   Additional Information SSN: [id number]  [place], [address]

## 2018-09-20 NOTE — Progress Notes (Signed)
Inpatient Diabetes Program Recommendations  AACE/ADA: New Consensus Statement on Inpatient Glycemic Control (2015)  Target Ranges:  Prepandial:   less than 140 mg/dL      Peak postprandial:   less than 180 mg/dL (1-2 hours)      Critically ill patients:  140 - 180 mg/dL   Lab Results  Component Value Date   GLUCAP 286 (H) 09/20/2018   HGBA1C 18.2 (H) 08/23/2018    Review of Glycemic Control Results for Ryan Medina, Ryan Medina (MRN [id number]) as of 09/20/2018 10:50  Ref. Range 09/19/2018 17:52 09/19/2018 21:10 09/20/2018 08:34  Glucose-Capillary Latest Ref Range: 70 - 99 mg/dL 103 (H) 96 286 (H)   Diabetes history: DM 2 Outpatient Diabetes medications:  Levemir 30 units q AM and 20 units q PM, Novolog 0-15 units tid with meals, Metformin 1000 mg bid Current orders for Inpatient glycemic control:  Novolog sensitive tid with meals and 0-5 units QHS, Lantus 7 units daily  Inpatient Diabetes Program Recommendations:  If FSBS continues to exceed 180 mg/dL, consider adding Lantus 4 units QHS.  Thanks, Bronson Curb, MSN, RNC-OB Diabetes Coordinator [phone] (8a-5p)

## 2018-09-20 NOTE — Consult Note (Signed)
Referral MD  Reason for Referral: locally advanced-inoperable pancreatic cancer; arterial emboli of the legs; GI bleeding secondary to duodenal ulcer; crack cocaine use  Chief Complaint  Patient presents with  . Numbness  : I had blood clots in my legs.  HPI: Mr. Laster is a very nice 57 year old African-American male.  He is with his wife.  I think that they are homeless right now.  He has been doing crack cocaine for about 20 years.  He says he done smoking every day.  He obviously has other health issues.  He has diabetes.  He has diastolic congestive heart failure.  He apparently has lost quite a bit of weight over the past 3 months.  About 2 or 3 weeks ago, he began to have pain in his legs.  Particularly his right leg.  He was found to have a acute popliteal and tibial embolus.  He underwent embolectomies.  He was then placed on IV heparin.  Unfortunately he had GI bleeding.  He was found to have a duodenal ulcer.  He also required fasciotomies for compartment syndrome.  Amongst all of this, he was found to have a mass in his pancreatic body.  He had a MRI of the abdomen on 09/12/2018.  This showed a 3.9 x 3.6 cm mass within the pancreatic neck.  It was involving the celiac artery and branches.  As such, it was felt to be non-resectable.  He was seen by GI for endoscopic ultrasound.  He had this done on 09/16/2018.  Biopsies came back with the pathology report ([id number]) that showed adenocarcinoma consistent with pancreatic cancer.  The CA 19-9 was 61.  So far, it looks like this is a tumor that is not metastatic but is also not resectable.  There is no cancer in the family.  He used to drink but does not drink much anymore.  He does smoke cigarettes.  He was working for [employer].  He is no longer working because of disabilities.  Again, I am not sure that he and his wife/fiance have a place to live.  It looks like the hospital is working on trying to help him out and  maybe get him to inpatient rehab.  Overall, I would ask his performance status is probably ECOG 1.   Past Medical History:  Diagnosis Date  . Back pain, chronic   . Diabetes ([place])   . Dilated cardiomyopathy ([place])    Echo 2/18: EF 45-50, diff HK, Gr 1 DD, trivial AI/TR  . HLD (hyperlipidemia)   . Hypertension   . IDDM (insulin dependent diabetes mellitus) ([place])   . Ruptured lumbar disc   :  Past Surgical History:  Procedure Laterality Date  . ABDOMINAL AORTOGRAM W/LOWER EXTREMITY Right 09/03/2018   Procedure: ABDOMINAL AORTOGRAM W/LOWER EXTREMITY;  Surgeon: Angelia Mould, MD;  Location: [place] [facility];  Service: Cardiovascular;  Laterality: Right;  . ABDOMINAL SURGERY    . APPENDECTOMY    . APPLICATION OF WOUND VAC Right 09/05/2018   Procedure: APPLICATION OF WOUND VAC RIGHT LOWER MEDIAL AND LATERAL FASCIOTOMY SITE;  Surgeon: Angelia Mould, MD;  Location: [place];  Service: Vascular;  Laterality: Right;  . BACK SURGERY    . BIOPSY  09/16/2018   Procedure: BIOPSY;  Surgeon: Milus Banister, MD;  Location: [place];  Service: Endoscopy;;  . CARDIAC CATHETERIZATION N/A 05/19/2016   Procedure: Left Heart Cath and Coronary Angiography;  Surgeon: Leonie Man, MD;  Location: [place] [facility];  Service: Cardiovascular;  Laterality: N/A;  . EMBOLECTOMY Right 09/03/2018   Procedure: Embolectomy Right Popliteal and Tibial Artery, Bovine Pericardium Patch Angioplasty Right Popliteal Artery;  Surgeon: Angelia Mould, MD;  Location: [place];  Service: Vascular;  Laterality: Right;  . EMBOLECTOMY Right 09/10/2018   Procedure: EMBOLECTOMY RIGHT LOWER EXTREMITY;  Surgeon: Angelia Mould, MD;  Location: [facility] OR;  Service: Vascular;  Laterality: Right;  . ESOPHAGOGASTRODUODENOSCOPY N/A 09/07/2018   Procedure: ESOPHAGOGASTRODUODENOSCOPY (EGD);  Surgeon: Irving Copas., MD;  Location: [place];  Service: Gastroenterology;  Laterality: N/A;  .  ESOPHAGOGASTRODUODENOSCOPY (EGD) WITH PROPOFOL N/A 09/16/2018   Procedure: ESOPHAGOGASTRODUODENOSCOPY (EGD) WITH PROPOFOL;  Surgeon: Milus Banister, MD;  Location: [facility] ENDOSCOPY;  Service: Endoscopy;  Laterality: N/A;  . EUS N/A 09/16/2018   Procedure: ESOPHAGEAL ENDOSCOPIC ULTRASOUND (EUS) RADIAL;  Surgeon: Milus Banister, MD;  Location: [facility] ENDOSCOPY;  Service: Endoscopy;  Laterality: N/A;  . FASCIOTOMY Right 09/05/2018   Procedure: FOUR COMPARTMENT FASCIOTOMY RIGHT LOWER LEG ;  Surgeon: Angelia Mould, MD;  Location: [place];  Service: Vascular;  Laterality: Right;  . FASCIOTOMY CLOSURE Right 09/10/2018   Procedure: FASCIOTOMY CLOSURE FOUR COMPARTMENT;  Surgeon: Angelia Mould, MD;  Location: [place];  Service: Vascular;  Laterality: Right;  . FINE NEEDLE ASPIRATION  09/16/2018   Procedure: FINE NEEDLE ASPIRATION (FNA) LINEAR;  Surgeon: Milus Banister, MD;  Location: [facility] ENDOSCOPY;  Service: Endoscopy;;  . HEMATOMA EVACUATION Right 09/05/2018   Procedure: EVACUATION HEMATOMA OF RIGHT LOWER EXTREMITY; EVACUATION OF LYMPHOCELE RIGHT LOWER LEG ;  Surgeon: Angelia Mould, MD;  Location: [place];  Service: Vascular;  Laterality: Right;  . IR ANGIOGRAM SELECTIVE EACH ADDITIONAL VESSEL  09/07/2018  . IR ANGIOGRAM SELECTIVE EACH ADDITIONAL VESSEL  09/07/2018  . IR ANGIOGRAM VISCERAL SELECTIVE  09/07/2018  . IR ANGIOGRAM VISCERAL SELECTIVE  09/07/2018  . IR EMBO ART  VEN HEMORR LYMPH EXTRAV  INC GUIDE ROADMAPPING  09/07/2018  . IR US GUIDE VASC ACCESS RIGHT  09/07/2018  . TEE WITHOUT CARDIOVERSION N/A 09/16/2018   Procedure: TRANSESOPHAGEAL ECHOCARDIOGRAM (TEE);  Surgeon: Milus Banister, MD;  Location: [facility] ENDOSCOPY;  Service: Endoscopy;  Laterality: N/A;  if cleared by GI  :   Current Facility-Administered Medications:  .  0.9 %  sodium chloride infusion (Manually program via Guardrails IV Fluids), , Intravenous, Once, Tamey Wanek R, MD .  0.9 %  sodium chloride infusion,  500 mL, Intravenous, Once PRN, Josue Hector, MD .  alum & mag hydroxide-simeth (MAALOX/MYLANTA) 200-200-20 MG/5ML suspension 30 mL, 30 mL, Oral, BID PRN, Josue Hector, MD, 30 mL at 09/17/18 0414 .  atorvastatin (LIPITOR) tablet 20 mg, 20 mg, Oral, Daily, Josue Hector, MD, 20 mg at 09/20/18 0901 .  chlorhexidine gluconate (MEDLINE KIT) (PERIDEX) 0.12 % solution 15 mL, 15 mL, Mouth Rinse, BID, Josue Hector, MD, 15 mL at 09/20/18 0857 .  famotidine (PEPCID) tablet 20 mg, 20 mg, Oral, BID PRN, Josue Hector, MD, 20 mg at 09/15/18 0037 .  furosemide (LASIX) injection 20 mg, 20 mg, Intravenous, Once, Kanyla Omeara, Rudell Cobb, MD .  furosemide (LASIX) injection 20 mg, 20 mg, Intravenous, Once, Kalissa Grays, Rudell Cobb, MD .  insulin aspart (novoLOG) injection 0-5 Units, 0-5 Units, Subcutaneous, QHS, Singh, Prashant K, MD .  insulin aspart (novoLOG) injection 0-9 Units, 0-9 Units, Subcutaneous, TID WC, Thurnell Lose, MD, 5 Units at 09/20/18 1753 .  insulin glargine (LANTUS) injection 7 Units, 7 Units, Subcutaneous,  Daily, Thurnell Lose, MD, 7 Units at 09/20/18 0900 .  ipratropium-albuterol (DUONEB) 0.5-2.5 (3) MG/3ML nebulizer solution 3 mL, 3 mL, Nebulization, Q4H PRN, Josue Hector, MD, 3 mL at 09/16/18 0247 .  [COMPLETED] alum & mag hydroxide-simeth (MAALOX/MYLANTA) 200-200-20 MG/5ML suspension 30 mL, 30 mL, Oral, Once, 30 mL at 09/13/18 2331 **AND** lidocaine (XYLOCAINE) 2 % viscous mouth solution 15 mL, 15 mL, Oral, Once, Nishan, Thamas Appleyard C, MD .  LORazepam (ATIVAN) tablet 0.5-1 mg, 0.5-1 mg, Oral, Q6H PRN, Josue Hector, MD, 1 mg at 09/20/18 0503 .  MEDLINE mouth rinse, 15 mL, Mouth Rinse, BID, Josue Hector, MD, 15 mL at 09/19/18 2120 .  morphine 2 MG/ML injection 1 mg, 1 mg, Intravenous, Q4H PRN, Josue Hector, MD, 1 mg at 09/20/18 0904 .  [DISCONTINUED] ondansetron (ZOFRAN) tablet 4 mg, 4 mg, Oral, Q6H PRN **OR** ondansetron (ZOFRAN) injection 4 mg, 4 mg, Intravenous, Q6H PRN, Josue Hector, MD, 4 mg at 09/14/18 2130 .  oxyCODONE-acetaminophen (PERCOCET) 7.5-325 MG per tablet 1 tablet, 1 tablet, Oral, Q6H PRN, Josue Hector, MD, 1 tablet at 09/20/18 1214 .  pantoprazole (PROTONIX) injection 40 mg, 40 mg, Intravenous, Q12H, Josue Hector, MD, 40 mg at 09/20/18 0854 .  polyethylene glycol (MIRALAX / GLYCOLAX) packet 17 g, 17 g, Oral, Daily PRN, Josue Hector, MD .  senna-docusate (Senokot-S) tablet 1 tablet, 1 tablet, Oral, QHS PRN, Josue Hector, MD .  sodium chloride flush (NS) 0.9 % injection 10-40 mL, 10-40 mL, Intracatheter, PRN, Josue Hector, MD, 10 mL at 09/11/18 0418 .  tamsulosin (FLOMAX) capsule 0.4 mg, 0.4 mg, Oral, Daily, Josue Hector, MD, 0.4 mg at 09/20/18 0901:  . sodium chloride   Intravenous Once  . atorvastatin  20 mg Oral Daily  . chlorhexidine gluconate (MEDLINE KIT)  15 mL Mouth Rinse BID  . furosemide  20 mg Intravenous Once  . furosemide  20 mg Intravenous Once  . insulin aspart  0-5 Units Subcutaneous QHS  . insulin aspart  0-9 Units Subcutaneous TID WC  . insulin glargine  7 Units Subcutaneous Daily  . lidocaine  15 mL Oral Once  . mouth rinse  15 mL Mouth Rinse BID  . pantoprazole (PROTONIX) IV  40 mg Intravenous Q12H  . tamsulosin  0.4 mg Oral Daily  :  Allergies  Allergen Reactions  . Lisinopril Anaphylaxis  :  Family History  Problem Relation Age of Onset  . Diabetes Mother   . Heart failure Mother   . Diabetes Sister   . Diabetes Brother   :  Social History   Socioeconomic History  . Marital status: Single    Spouse name: Not on file  . Number of children: Not on file  . Years of education: Not on file  . Highest education level: Not on file  Occupational History  . Not on file  Social Needs  . Financial resource strain: Not on file  . Food insecurity:    Worry: Not on file    Inability: Not on file  . Transportation needs:    Medical: Not on file    Non-medical: Not on file  Tobacco Use  . Smoking  status: Current Every Day Smoker    Packs/day: 0.25    Years: 25.00    Pack years: 6.25    Types: Cigarettes  . Smokeless tobacco: Never Used  . Tobacco comment: 2 per day   Substance and Sexual Activity  .  Alcohol use: No    Frequency: Never  . Drug use: No  . Sexual activity: Yes  Lifestyle  . Physical activity:    Days per week: Not on file    Minutes per session: Not on file  . Stress: Not on file  Relationships  . Social connections:    Talks on phone: Not on file    Gets together: Not on file    Attends religious service: Not on file    Active member of club or organization: Not on file    Attends meetings of clubs or organizations: Not on file    Relationship status: Not on file  . Intimate partner violence:    Fear of current or ex partner: Not on file    Emotionally abused: Not on file    Physically abused: Not on file    Forced sexual activity: Not on file  Other Topics Concern  . Not on file  Social History Narrative  . Not on file  :  Review of Systems  Constitutional: Positive for weight loss.  HENT: Negative.   Eyes: Negative.   Respiratory: Negative.   Cardiovascular: Positive for claudication.  Gastrointestinal: Positive for blood in stool.  Genitourinary: Negative.   Musculoskeletal: Positive for back pain.  Skin: Negative.   Neurological: Positive for tingling.  Endo/Heme/Allergies: Negative.   Psychiatric/Behavioral: Positive for substance abuse.     Exam: This is a thin African-American male in no obvious distress.  Head neck exam shows no scleral icterus.  There is no oral lesions.  He has no adenopathy in the neck.  He has some slight temporal muscle wasting.  Lungs are relatively clear bilaterally.  Cardiac exam regular rate and rhythm with a normal S1 and S2.  He has 1/6 systolic ejection murmur.  Abdomen is soft.  There is no fluid wave.  There is no guarding or rebound tenderness.  He has no obvious abdominal mass.  There is no palpable  liver or spleen tip.  Extremities shows the staples in his right lower leg.  There is no edema in his legs.  He has some muscle atrophy in upper lower extremities.  His strength is good in upper and lower extremities.  Neurological exam shows no obvious neurological deficits.  There may be some hyperesthesia with his right leg.  Skin exam shows no rashes, ecchymoses or petechia. Patient Vitals for the past 24 hrs:  BP Temp Temp src Pulse Resp SpO2  09/20/18 0455 116/78 98.6 F (37 C) Oral 93 18 100 %  09/19/18 2109 120/75 99.3 F (37.4 C) Oral (!) 111 20 100 %     Recent Labs    09/19/18 0356 09/20/18 0421  WBC 14.0* 10.1  HGB 8.4* 8.2*  HCT 25.2* 25.6*  PLT 390 393   Recent Labs    09/19/18 0356 09/20/18 0421  NA 133* 132*  K 3.4* 3.8  CL 98 96*  CO2 26 28  GLUCOSE 235* 264*  BUN 7 10  CREATININE 0.64 0.61  CALCIUM 7.6* 7.6*    Blood smear review: None  Pathology: See above    Assessment and Plan: Mr. Marro is a very nice 57 year old African-American male.  He has had an incredibly complicated hospital course.  The obvious hypercoagulability with respect to the arterial emboli I am sure is a reflection of his malignancy but also his cocaine use.  He really needs to stop the crack cocaine smoking or else he will continue to have issues with  his vascular supply.  By the CA 19-9, and the MRI, it certainly does not look like he has any obvious metastatic disease.  I think the problem is going to be how to best treat him for this locally advanced, inoperable disease.  I would probably have to recommend a combination of radiation and chemotherapy.  As such, please consult radiation therapy to see him.  I really also believe that at some point is going to have to get on anticoagulation again.  I think he will continue to have embolic events if he does not.  I realize that there is a definite risk with him bleeding.  Maybe, if we keep him off anticoagulation for another  couple weeks and then gradually reintroduce anticoagulation to him, this might be safe.  I do not think he needs a hypercoagulable panel done.  I do believe that he needs to be transfused.  He has diabetes.  Blood sugar is quite high.  I doubt that he will be able to mount an erythropoiesis that could improve his hemoglobin.  I will also check iron studies on him.  I suspect he probably will be iron deficient.  For right now, I would let him continue to recover from his surgeries.  Hopefully, he will regain some more mobility.  The weight loss does bother me.  Hopefully, he will gain some more weight while in the hospital and being given 3 meals.  I would make sure that the dietitian sees him to try to optimize his caloric intake without ruining his blood sugars too badly.  For right now, I do not think he needs to have a Port-A-Cath put in.  We will follow along and try to help out any way that we can.  Hopefully, the hospital will be able to help him find accommodations that will be acceptable considering his current status.  [name], MD  Michaelyn Barter 4:6

## 2018-09-21 LAB — COMPREHENSIVE METABOLIC PANEL
ALT: 20 U/L (ref 0–44)
AST: 15 U/L (ref 15–41)
Albumin: 1.6 g/dL — ABNORMAL LOW (ref 3.5–5.0)
Alkaline Phosphatase: 64 U/L (ref 38–126)
Anion gap: 8 (ref 5–15)
BILIRUBIN TOTAL: 0.3 mg/dL (ref 0.3–1.2)
BUN: 10 mg/dL (ref 6–20)
CO2: 28 mmol/L (ref 22–32)
Calcium: 7.8 mg/dL — ABNORMAL LOW (ref 8.9–10.3)
Chloride: 100 mmol/L (ref 98–111)
Creatinine, Ser: 0.48 mg/dL — ABNORMAL LOW (ref 0.61–1.24)
GFR calc Af Amer: >60 mL/min (ref >60)
GFR calc non Af Amer: >60 mL/min (ref >60)
Glucose, Bld: 262 mg/dL — ABNORMAL HIGH (ref 70–99)
Potassium: 4.2 mmol/L (ref 3.5–5.1)
Sodium: 136 mmol/L (ref 135–145)
Total Protein: 5.2 g/dL — ABNORMAL LOW (ref 6.5–8.1)

## 2018-09-21 LAB — GLUCOSE, CAPILLARY
GLUCOSE-CAPILLARY: 246 mg/dL — AB (ref 70–99)
GLUCOSE-CAPILLARY: 153 mg/dL — AB (ref 70–99)
Glucose-Capillary: 163 mg/dL — ABNORMAL HIGH (ref 70–99)
Glucose-Capillary: 242 mg/dL — ABNORMAL HIGH (ref 70–99)

## 2018-09-21 LAB — CBC
HEMATOCRIT: 26 % — AB (ref 39.0–52.0)
HEMOGLOBIN: 8.4 g/dL — AB (ref 13.0–17.0)
MCH: 27.6 pg (ref 26.0–34.0)
MCHC: 32.3 g/dL (ref 30.0–36.0)
MCV: 85.5 fL (ref 80.0–100.0)
Platelets: 444 10*3/uL — ABNORMAL HIGH (ref 150–400)
RBC: 3.04 MIL/uL — AB (ref 4.22–5.81)
RDW: 14.2 % (ref 11.5–15.5)
WBC: 7.9 10*3/uL (ref 4.0–10.5)
nRBC: 0 % (ref 0.0–0.2)

## 2018-09-21 LAB — FERRITIN: Ferritin: 112 ng/mL (ref 24–336)

## 2018-09-21 LAB — IRON AND TIBC
Iron: 8 ug/dL — ABNORMAL LOW (ref 45–182)
SATURATION RATIOS: 4 % — AB (ref 17.9–39.5)
TIBC: 190 ug/dL — ABNORMAL LOW (ref 250–450)
UIBC: 182 ug/dL

## 2018-09-21 LAB — PREPARE RBC (CROSSMATCH)

## 2018-09-21 MED ORDER — ZOLPIDEM TARTRATE 5 MG PO TABS
5.0000 mg | ORAL_TABLET | Freq: Every evening | ORAL | Status: DC | PRN
  Administered 2018-09-21 – 2018-09-22 (×2): 5 mg via ORAL
  Filled 2018-09-21 (×3): qty 1

## 2018-09-21 MED ORDER — PANTOPRAZOLE SODIUM 40 MG PO TBEC
40.0000 mg | DELAYED_RELEASE_TABLET | Freq: Two times a day (BID) | ORAL | Status: DC
  Administered 2018-09-22 – 2018-09-24 (×5): 40 mg via ORAL
  Filled 2018-09-21 (×7): qty 1

## 2018-09-21 NOTE — Progress Notes (Signed)
PROGRESS NOTE        PATIENT DETAILS Name: Ryan Medina Age: 57 y.o. Sex: male Date of Birth: [birth year] Admit Date: 09/03/2018 Admitting Physician [name], MD YQM:VHQIONG, No Pcp Per  Brief Narrative: Patient is a 57 y.o. male with history of cocaine use, chronic systolic heart failure presented to the hospital on 12/13 with a right ischemic leg secondary to acute popliteal and tibial embolus, underwent embolectomy on 12/13 and subsequently was placed on a heparin drip.  He unfortunately developed a acute right lower leg hematoma with compartment syndrome requiring fasciotomy on 12/15.  Further hospital course was complicated by development of hemorrhagic shock with acute blood loss anemia secondary to a bleeding duodenal ulcer-requiring ICU transfer-intubation for airway protection and IR evaluation with mesenteric angiogram and GDA embolization.  Upon stability he was transferred back to the triad hospitalist service on 12/19.  See below for further details.  Subjective:  Patient in bed, appears comfortable, denies any headache, no fever, no chest pain or pressure, no shortness of breath , no abdominal pain. No focal weakness.  No nausea vomiting or bright red blood in stool.   Assessment/Plan:  Right ischemic leg secondary to popliteal artery/tibial artery embolism-s/p embolectomy on 29/52 complicated by right lower extremity hematoma with compartment syndrome requiring fasciotomy on 12/15:   Source of embolization unclear, required fasciotomy on 09/05/2018 by vascular surgery status post closure of fasciotomy on 09/10/2018.  His course was complicated by large hematoma in the right leg along with significant life-threatening GI bleed. Case discussed with vascular surgeon Dr. Monica Martinez who wants to continue anticoagulation as much as possible.   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  Kindly see  anticoagulation below.  Cardiology was consulted for TEE await report.  Upper GI bleeding with hemorrhagic shock and acute blood loss anemia: Developed hemorrhagic shock, was seen by GI, required multiple PRBC transfusions last transfusion on 09/09/2018.  Was seen by GI and eventually underwent gastroduodenal artery embolization on 09/07/2018.  Total 8 units of packed RBC, 1 unit of FFP and 1 unit of platelets transfused around 09/08/2018. GI note noted from 09/10/2018 (okay with heparin challenge).  He was rechallenged with heparin cautiously on 09/12/2018 unfortunately he bled again on 09/14/2018 after which heparin was stopped and he received another 3 units of packed RBCs.  So far he has received 11 units of packed RBCs.  He is getting 12th unit of packed RBC on 09/21/2018 but oncology.  His H&H appears stable.  He also underwent EUS on 09/16/2018 which showed +ve Duodenal Ulcer which was still raw. Continue PPI and Hold Heparin indefinitely.   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  MRI noted & confirms mass, CA 19.9 was elevated, GI seeing and underwent EUS with biopsy on 09/16/2018, biopsy consistent with adenocarcinoma. Oncology following defer further management of this problem to oncology.  Hypokalemia and hypomagnesemia.  Replaced and now stable.    Urinary retention.  On Foley and Flomax.  Foley removed on 09/15/2018.     Acute hypoxic respiratory failure: Intubated when he developed hemorrhagic shock secondary to GI bleeding, successfully extubated-currently stable on 2 L of oxygen via nasal cannula.  Chronic systolic heart failure (EF 45-50% by TTE on 09/04/2018): Reasonably well compensated-follow weights, volume status, electrolytes closely.  Cocaine use/tobacco use: Counseled-I am still not sure  if he has any intention of quitting.  3 cm AAA: Stable for follow-up in the outpatient setting  Insulin-dependent DM-2: Last A1c on 12/2 was 18.2.  Labile sugars with  episodes of hypoglycemia, have adjusted Lantus dose and sliding scale on 09/19/2018.  We will continue to monitor closely he is also very noncompliant with diet, will monitor.  CBG (last 3)  Recent Labs    09/20/18 1718 09/20/18 2147 09/21/18 0749  GLUCAP 269* 184* 246*      DVT Prophylaxis: Heparin drip had to be stopped on 09/15/2018 due to GI bleed, will place SCDs in the left leg.  Code Status: Full code   Family Communication: Wife at bedside 12/25 & 12/26  Disposition Plan:  SNF  Antimicrobial agents: Anti-infectives (From admission, onward)   Start     Dose/Rate Route Frequency Ordered Stop   09/10/18 2130  ceFAZolin (ANCEF) IVPB 2g/100 mL premix     2 g 200 mL/hr over 30 Minutes Intravenous Every 8 hours 09/10/18 1634 09/11/18 0700   09/10/18 0800  ceFAZolin (ANCEF) IVPB 1 g/50 mL premix    Note to Pharmacy:  Send with pt to OR   1 g 100 mL/hr over 30 Minutes Intravenous On call 09/09/18 0756 09/10/18 1407   09/07/18 1300  erythromycin 250 mg in sodium chloride 0.9 % 100 mL IVPB     250 mg 100 mL/hr over 60 Minutes Intravenous Once 09/07/18 1125 09/07/18 2351   09/06/18 0000  ceFAZolin (ANCEF) IVPB 1 g/50 mL premix    Note to Pharmacy:  Send with pt to OR   1 g 100 mL/hr over 30 Minutes Intravenous On call 09/05/18 0849 09/05/18 0952   09/03/18 2245  ceFAZolin (ANCEF) IVPB 2g/100 mL premix     2 g 200 mL/hr over 30 Minutes Intravenous Every 8 hours 09/03/18 2236 09/04/18 1444   09/03/18 1615  ceFAZolin (ANCEF) IVPB 2g/100 mL premix  Status:  Discontinued     2 g 200 mL/hr over 30 Minutes Intravenous On call to O.R. 09/03/18 1600 09/03/18 2035      Procedures:  12/17>>Mesenteric angiogram and coil embolization of the GDA  12/17>>EGD  12/15>> 1.  Evacuation hematoma right leg 2.  Evacuation of lymphocele right leg 3.  4 compartment fasciotomy 4.  Placement of VAC (medial and lateral)  12/13>> 1.  Ultrasound-guided access to the left common femoral  artery 2.  Aortogram with bilateral iliac arteriogram 3.  Selective catheterization of the right external iliac artery with right lower extremity runoff  MRI Abd.  Pancreatic mass.   TTE - Left ventricle: Wall thickness was increased in a pattern of mild LVH. Systolic function was mildly reduced. The estimated ejection fraction was in the range of 45% to 50%. Wall motion was normal; there were no regional wall motion abnormalities. Doppler parameters are consistent with abnormal left ventricular relaxation (grade 1 diastolic dysfunction). - Aortic valve: There was trivial regurgitation. - Mitral valve: Mildly calcified annulus. There was mild regurgitation. - Right atrium: Central venous pressure (est): 3 mm Hg. - Tricuspid valve: There was mild regurgitation. - Pulmonary arteries: Systolic pressure could not be accurately estimated. - Pericardium, extracardiac: There was no pericardial effusion.   TEE -   EUS  Impression:  - 3.6cm by 2.1cm mass in the neck of pancreas that surrounds the celiac trunk, obstructs the main pancreatic duct and appears to have also invaded the duodenal bulb created a large, malignant, friable ulcer. I biopsied the abnormal, firm mucosa  surrounding the duodenal ulcer and sampled the pancreatic mass with transgastric EUS FNA. Preliminary cytology review from the pancreatic mass was positive for malignancy.   Recommendation   - Return patient to hospital [facility] for ongoing care. - He has recieved 13 units of blood this admission, 3 of them in the past 24 hours. He will continue to ooze from the duodenal ulcer that I suspect is malignant, related to the nearby large pancreatic mass. Judicious use of bloodthinners is strongly recommended. - Await final cytology/pathology reports; both hopefully available tomorrow.        CONSULTS:  pulmonary/intensive care, vascular surgery and IR, GI, Cards  Time spent: 40 minutes-Greater than 50% of this time was spent  in counseling, explanation of diagnosis, planning of further management, and coordination of care.  MEDICATIONS: Scheduled Meds: . atorvastatin  20 mg Oral Daily  . chlorhexidine gluconate (MEDLINE KIT)  15 mL Mouth Rinse BID  . furosemide  20 mg Intravenous Once  . insulin aspart  0-5 Units Subcutaneous QHS  . insulin aspart  0-9 Units Subcutaneous TID WC  . insulin glargine  7 Units Subcutaneous Daily  . lidocaine  15 mL Oral Once  . mouth rinse  15 mL Mouth Rinse BID  . pantoprazole (PROTONIX) IV  40 mg Intravenous Q12H  . tamsulosin  0.4 mg Oral Daily   Continuous Infusions: . sodium chloride     PRN Meds:.sodium chloride, alum & mag hydroxide-simeth, famotidine, ipratropium-albuterol, LORazepam, morphine injection, [DISCONTINUED] ondansetron **OR** ondansetron (ZOFRAN) IV, oxyCODONE-acetaminophen, polyethylene glycol, senna-docusate, sodium chloride flush, zolpidem   PHYSICAL EXAM: Vital signs: Vitals:   09/21/18 0521 09/21/18 0552 09/21/18 0806 09/21/18 0835  BP: 114/79 121/86 121/83 (!) 137/98  Pulse: 88 92 87   Resp: 18 16 16 16   Temp: 98.5 F (36.9 C) 98.6 F (37 C) 98.5 F (36.9 C) 98.3 F (36.8 C)  TempSrc: Oral Oral Oral Oral  SpO2: 100% 100% 100% 100%  Weight:      Height:       Filed Weights   09/03/18 1650 09/16/18 1325  Weight: 66.7 kg 66.7 kg   Body mass index is 20.51 kg/m.   Exam  Awake Alert, Oriented X 3, No new F.N deficits, Normal affect [place].AT,PERRAL Supple Neck,No JVD, No cervical lymphadenopathy appriciated.  Symmetrical Chest wall movement, Good air movement bilaterally, CTAB RRR,No Gallops, Rubs or new Murmurs, No Parasternal Heave +ve B.Sounds, Abd Soft, No tenderness, No organomegaly appriciated, No rebound - guarding or rigidity. No Cyanosis, Clubbing or edema, No new Rash or bruise   I have personally reviewed following labs and imaging studies  LABORATORY DATA: CBC: Recent Labs  Lab 09/17/18 0358 09/18/18 0426  09/19/18 0356 09/20/18 0421 09/21/18 0350  WBC 13.3* 19.4* 14.0* 10.1 7.9  HGB 8.8* 8.6* 8.4* 8.2* 8.4*  HCT 25.9* 25.6* 25.2* 25.6* 26.0*  MCV 84.4 84.5 85.4 85.6 85.5  PLT 354 382 390 393 444*    Basic Metabolic Panel: Recent Labs  Lab 09/16/18 0550 09/17/18 0358 09/18/18 0426 09/19/18 0356 09/20/18 0421 09/21/18 0350  NA 134* 135 132* 133* 132* 136  K 3.8 3.4* 3.9 3.4* 3.8 4.2  CL 102 101 99 98 96* 100  CO2 23 25 25 26 28 28   GLUCOSE 138* 68* 101* 235* 264* 262*  BUN 15 8 7 7 10 10   CREATININE 0.69 0.62 0.59* 0.64 0.61 0.48*  CALCIUM 7.5* 7.5* 7.5* 7.6* 7.6* 7.8*  MG 1.7 1.6* 1.8  --  1.9  --  GFR: Estimated Creatinine Clearance: 96.1 mL/min (A) (by C-G formula based on SCr of 0.48 mg/dL (L)).  Liver Function Tests: Recent Labs  Lab 09/17/18 0358 09/18/18 0426 09/19/18 0356 09/20/18 0421 09/21/18 0350  AST 28 19 16  14* 15  ALT 28 24 22 21 20   ALKPHOS 51 69 70 65 64  BILITOT 1.0 0.6 0.4 0.4 0.3  PROT 4.4* 4.6* 4.8* 4.8* 5.2*  ALBUMIN 1.5* 1.6* 1.5* 1.6* 1.6*   No results for input(s): LIPASE, AMYLASE in the last 168 hours. No results for input(s): AMMONIA in the last 168 hours.  Coagulation Profile: No results for input(s): INR, PROTIME in the last 168 hours.  Cardiac Enzymes: No results for input(s): CKTOTAL, CKMB, CKMBINDEX, TROPONINI in the last 168 hours.  BNP (last 3 results) No results for input(s): PROBNP in the last 8760 hours.  HbA1C: No results for input(s): HGBA1C in the last 72 hours.  CBG: Recent Labs  Lab 09/20/18 0834 09/20/18 1145 09/20/18 1718 09/20/18 2147 09/21/18 0749  GLUCAP 286* 162* 269* 184* 246*    Lipid Profile: No results for input(s): CHOL, HDL, LDLCALC, TRIG, CHOLHDL, LDLDIRECT in the last 72 hours.  Thyroid Function Tests: No results for input(s): TSH, T4TOTAL, FREET4, T3FREE, THYROIDAB in the last 72 hours.  Anemia Panel: Recent Labs    09/21/18 0350  FERRITIN 112  TIBC 190*  IRON 8*    Urine  analysis:    Component Value Date/Time   COLORURINE YELLOW 09/18/2018 1152   APPEARANCEUR HAZY (A) 09/18/2018 1152   LABSPEC 1.011 09/18/2018 1152   PHURINE 6.0 09/18/2018 1152   GLUCOSEU NEGATIVE 09/18/2018 1152   HGBUR NEGATIVE 09/18/2018 1152   BILIRUBINUR NEGATIVE 09/18/2018 1152   KETONESUR NEGATIVE 09/18/2018 1152   PROTEINUR NEGATIVE 09/18/2018 1152   UROBILINOGEN 4.0 (H) 12/05/2016 0921   NITRITE NEGATIVE 09/18/2018 1152   LEUKOCYTESUR SMALL (A) 09/18/2018 1152    Sepsis Labs: Lactic Acid, Venous    Component Value Date/Time   LATICACIDVEN 1.7 09/07/2018 1600    MICROBIOLOGY: No results found for this or any previous visit (from the past 240 hour(s)).  RADIOLOGY STUDIES/RESULTS: Dg Chest 2 View  Result Date: 09/18/2018 CLINICAL DATA:  57 year old former smoker who had a fever and shortness of breath yesterday. Breathing has improved significantly after nebulizer treatment. Follow-up pneumonia. EXAM: CHEST - 2 VIEW COMPARISON:  09/09/2018 and earlier, including CT chest 09/06/2018. FINDINGS: Cardiac silhouette normal in size, unchanged. Thoracic aorta mildly tortuous, unchanged. Hilar and mediastinal contours otherwise unremarkable. Mild emphysematous changes in both lungs as noted on the recent CT. Linear atelectasis or developing scar in the LEFT LOWER LOBE at the site of the recent pneumonia. Lungs otherwise clear. No localized airspace consolidation. No pleural effusions. No pneumothorax. Normal pulmonary vascularity. Visualized bony thorax intact. IMPRESSION: COPD/emphysema. Linear atelectasis or developing scar in the LEFT LOWER LOBE at the site of the recent pneumonia. No acute cardiopulmonary disease otherwise. Electronically Signed   By: Evangeline Dakin M.D.   On: 09/18/2018 12:41   Dg Abd 1 View  Result Date: 09/07/2018 CLINICAL DATA:  OG tube placement. EXAM: ABDOMEN - 1 VIEW COMPARISON:  CT abdomen pelvis 08/22/2018. FINDINGS: OG tube is in place with the side  port and tip in the body of the stomach. The stomach is distended. IMPRESSION: OG tube in good position. Electronically Signed   By: Inge Rise M.D.   On: 09/07/2018 13:31   Ct Angio Chest Pe W Or Wo Contrast  Result Date: 09/06/2018 CLINICAL  DATA:  Evaluate thoracic aortic aneurysm. EXAM: CT ANGIOGRAPHY CHEST WITH CONTRAST TECHNIQUE: Multidetector CT imaging of the chest was performed using the standard protocol during bolus administration of intravenous contrast. Multiplanar CT image reconstructions and MIPs were obtained to evaluate the vascular anatomy. CONTRAST:  65 mL ISOVUE-370 IOPAMIDOL (ISOVUE-370) INJECTION 76% COMPARISON:  01/30/2018; 10/14/2016; 04/25/2026 FINDINGS: Vascular Findings: No evidence of thoracic aortic aneurysm with measurements as follows. No definite evidence of thoracic aortic dissection or periaortic stranding on this nongated examination. Bovine configuration of the aortic arch. The branch vessels of the aortic arch appear widely patent throughout their imaged courses. The descending thoracic aorta is of normal caliber and widely patent without hemodynamically significant stenosis. There is a very minimal amount of atherosclerotic plaque involving the proximal aspect of the descending thoracic aorta. Cardiomegaly.  No pericardial effusion. Although this examination was not tailored for the evaluation the pulmonary arteries, there are no discrete filling defects within the central pulmonary arterial tree to suggest central pulmonary embolism. Borderline enlarged caliber the main pulmonary artery measuring 32 mm in diameter (image 67, series 6) ------------------------------------------------------------- Thoracic aortic measurements: Sinotubular junction 33 mm as measured in greatest oblique coronal dimension. Proximal ascending aorta 35 mm as measured in greatest oblique axial dimension at the level of the main pulmonary artery (image 70, series 6); and approximately 36 mm in  greatest oblique short axis coronal diameter (coronal image 48, series 9). Aortic arch aorta 28 mm as measured in greatest oblique sagittal dimension. Proximal descending thoracic aorta 30 mm as measured in greatest oblique axial dimension at the level of the main pulmonary artery. Distal descending thoracic aorta 29 mm as measured in greatest oblique axial dimension at the level of the diaphragmatic hiatus. Review of the MIP images confirms the above findings. ------------------------------------------------------------- Non-Vascular Findings: Mediastinum/Lymph Nodes: Prominent right hilar lymph nodes are not enlarged by size criteria with index right suprahilar lymph node measuring 0.8 cm in greatest short axis diameter (image 71, series 6) and index right infrahilar lymph node measuring 0.7 cm (image 88). No definitive bulky mediastinal, hilar or axillary lymphadenopathy. Lungs/Pleura: Interval development of small bilateral pleural effusions with worsening bibasilar consolidative opacities and associated air bronchograms, right greater than left. Interval development of an approximately 1.0 x 0.5 cm subpleural consolidative opacity within the right upper lobe (image 36, series 7). Unchanged punctate (approximately 3 mm) calcified granuloma within the left lower lobe (image 75, series 3). Evaluation for additional pulmonary nodules is degraded secondary to bibasilar airspace opacities. The central pulmonary airways appear widely patent. Upper abdomen: Fluid distention of the mid and distal aspects of the esophagus with marked distension of the imaged superior aspect of the stomach. Musculoskeletal: No acute or aggressive osseous abnormalities. There is incomplete fusion involving the spinous processes of multiple thoracic vertebral bodies, unchanged. Regional soft tissues appear normal. Normal appearance of the thyroid gland. IMPRESSION: 1. No evidence of thoracic aortic aneurysm or dissection. 2. Small bilateral  effusions with associated bibasilar consolidative opacities with associated air bronchograms, atelectasis versus infiltrate. Follow-up chest radiograph in 4-6 weeks after treatment is recommended to ensure resolution. 3. Fluid distension of the mid and distal aspects of the esophagus as well as marked distension of the imaged superior aspect stomach - correlation for gastric outlet obstructive symptoms is advised. 4. Cardiomegaly with enlargement of the caliber the main pulmonary artery as could be seen in the setting of pulmonary arterial hypertension. Electronically Signed   By: Sandi Mariscal M.D.   On: 09/06/2018 09:20  Mr Abdomen W Wo Contrast  Result Date: 09/12/2018 CLINICAL DATA:  Suspicion of pancreatic neck mass on CT. EXAM: MRI ABDOMEN WITHOUT AND WITH CONTRAST TECHNIQUE: Multiplanar multisequence MR imaging of the abdomen was performed both before and after the administration of intravenous contrast. CONTRAST:  6 cc Gadavist COMPARISON:  CT of 1 day prior. Stone study of 08/22/2018. More remote CT of 12/19/2017. FINDINGS: Moderatemultifactorial degradation, including respiratory motion and susceptibility artifact from embolization coils. Lower chest: Bilateral pleural fluid with bibasilar airspace disease. Normal heart size. Hepatobiliary: Left hepatic lobe 9 mm cyst. Too small to characterizea right hepatic lobe lesion is most likely a cyst at 2 mm. Normal gallbladder, without biliary ductal dilatation. Pancreas: An area of soft tissue fullness and restricted diffusion is identified within the pancreatic neck, corresponding to the abnormality on CT. This measures on the order of 3.9 x 3.6 cm on image 76/12. Also 3.6 x 3.6 cm on image 35/31. Results in pancreatic duct dilatation within the upstream body and tail on image 17/11. Involves the celiac and its branches including on image 75/12. Spleen:  Grossly within normal limits. Adrenals/Urinary Tract: Normal adrenal glands. Grossly normal kidneys,  without hydronephrosis. Stomach/Bowel: Gastric distension with proximal wall and fold thickening, including on image 73/12. Grossly normal abdominal bowel loops. Vascular/Lymphatic: Normal caliber of the aorta. 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 Limited evaluation for abdominal adenopathy. Other:  Small volume abdominal ascites. Musculoskeletal: No acute osseous abnormality. IMPRESSION: 1. Moderate motion and susceptibility artifact degradation throughout. 2. Confirmation of a pancreatic neck mass, most consistent with adenocarcinoma. Involvement of the celiac and its branches, most consistent with a non-operative lesion. 3. Consider endoscopic ultrasound sampling. 4. Gastric wall and fold thickening, suggesting gastritis. 5. Small bilateral pleural effusions with adjacent airspace disease. Electronically Signed   By: Abigail Miyamoto M.D.   On: 09/12/2018 03:04   Ir Angiogram Visceral Selective  Result Date: 09/07/2018 INDICATION: 57 year old male with large volume upper GI bleed secondary to a very large duodenal ulcer. Patient underwent endoscopic intervention but continues to require pressors and large volume transfusion. He presents for mesenteric arteriography and embolization of any active bleeding and prophylactic embolization of the gastroduodenal artery. EXAM: IR ULTRASOUND GUIDANCE VASC ACCESS RIGHT; ADDITIONAL ARTERIOGRAPHY; IR EMBO ART VEN HEMORR LYMPH EXTRAV INC GUIDE ROADMAPPING; SELECTIVE VISCERAL ARTERIOGRAPHY 1. Ultrasound-guided vascular access 2. Catheterization of the left gastric artery where it arises directly from the aorta with arteriogram 3. Catheterization of the celiac artery with arteriogram 4. Catheterization of the common hepatic artery with arteriogram 5. Catheterization of the gastroduodenal artery with arteriogram 6. Coil embolization of the gastroduodenal artery 7. Catheterization of the superior mesenteric artery with arteriogram MEDICATIONS: None  ANESTHESIA/SEDATION: Patient intubated and on propofol drip CONTRAST:  70 mL Isovue 370 FLUOROSCOPY TIME:  Fluoroscopy Time: 11 minutes 30 seconds (487 mGy). COMPLICATIONS: None immediate. PROCEDURE: Informed consent was obtained from the patient following explanation of the procedure, risks, benefits and alternatives. The patient understands, agrees and consents for the procedure. All questions were addressed. A time out was performed prior to the initiation of the procedure. Maximal barrier sterile technique utilized including caps, mask, sterile gowns, sterile gloves, large sterile drape, hand hygiene, and Betadine prep. The right common femoral artery was interrogated with ultrasound and found to be widely patent. An image was obtained and stored for the medical record. Local anesthesia was attained by infiltration with 1% lidocaine. A small dermatotomy was made. Under real-time sonographic guidance, the vessel was punctured with a  21 gauge micropuncture needle. Using standard technique, the initial micro needle was exchanged over a 0.018 micro wire for a transitional 4 [place] micro sheath. The micro sheath was then exchanged over a 0.035 wire for a 5 French vascular sheath. A C2 cobra catheter was advanced in the abdominal aorta over a Bentson wire. The catheter was used to select the first vessel arising from the ventral aorta. Contrast was injected. This is a left gastric artery arising directly from the aorta. Anatomy is normal. No evidence of active bleeding. The Cobra catheter was then used to select the origin of the celiac artery. Arteriography was performed. The splenic artery is small in caliber. The hepatic artery demonstrates normal hepatic arterial anatomy. The gastroduodenal artery is slightly irregular with spasm in the proximal segment. No evidence of active extravasation. A glidewire was successfully advanced into the common hepatic artery and the C2 cobra catheter was advanced into the common  hepatic artery. Additional arteriography was performed in multiple obliquities. Again, the proximal gastroduodenal artery is irregular and spasmodic. No evidence of dissection, aneurysm or active bleeding. A Lantern microcatheter was then advanced over a Fathom 16 wire and used to select the distal aspect of the gastroduodenal artery. Arteriography was performed. The gastroepiploic arteries are widely patent. No evidence of active bleeding. Coil embolization was then performed using a 4 mm Ruby POD detachable micro coil followed by a 45 cm Ruby packing coil. Post embolization arteriography demonstrates complete occlusion of the vessel with stasis in the proximal segment. The Lantern microcatheter was brought back into the common hepatic artery and arteriography was performed. Again, complete occlusion of the gastroduodenal artery. The right gastric artery is visualized. No evidence of active bleeding from the right gastric artery. The microcatheter was removed. The C2 cobra catheter was used to select the superior mesenteric artery. Arteriography was performed. Patent pancreaticoduodenal arcade supplying the gastroepiploic artery. No evidence of active hemorrhage. The C2 cobra catheter was removed. A limited right common femoral arteriogram was performed confirming common femoral arterial access. Hemostasis was attained with the assistance of a Cordis ExoSeal extra arterial vascular plug. IMPRESSION: 1. No evidence of active hemorrhage. 2. 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 3. Prophylactic coil embolization of the gastroduodenal artery. Signed, Criselda Peaches, MD, [place] [facility] Radiology Electronically Signed   By: Jacqulynn Cadet M.D.   On: 09/07/2018 22:32   Ir Angiogram Visceral Selective  Result Date: 09/07/2018 INDICATION: 57 year old male with large volume upper GI bleed secondary to a very large duodenal ulcer.  Patient underwent endoscopic intervention but continues to require pressors and large volume transfusion. He presents for mesenteric arteriography and embolization of any active bleeding and prophylactic embolization of the gastroduodenal artery. EXAM: IR ULTRASOUND GUIDANCE VASC ACCESS RIGHT; ADDITIONAL ARTERIOGRAPHY; IR EMBO ART VEN HEMORR LYMPH EXTRAV INC GUIDE ROADMAPPING; SELECTIVE VISCERAL ARTERIOGRAPHY 1. Ultrasound-guided vascular access 2. Catheterization of the left gastric artery where it arises directly from the aorta with arteriogram 3. Catheterization of the celiac artery with arteriogram 4. Catheterization of the common hepatic artery with arteriogram 5. Catheterization of the gastroduodenal artery with arteriogram 6. Coil embolization of the gastroduodenal artery 7. Catheterization of the superior mesenteric artery with arteriogram MEDICATIONS: None ANESTHESIA/SEDATION: Patient intubated and on propofol drip CONTRAST:  70 mL Isovue 370 FLUOROSCOPY TIME:  Fluoroscopy Time: 11 minutes 30 seconds (487 mGy). COMPLICATIONS: None immediate. PROCEDURE: Informed consent was obtained from the patient following explanation of the procedure, risks, benefits and alternatives. The  patient understands, agrees and consents for the procedure. All questions were addressed. A time out was performed prior to the initiation of the procedure. Maximal barrier sterile technique utilized including caps, mask, sterile gowns, sterile gloves, large sterile drape, hand hygiene, and Betadine prep. The right common femoral artery was interrogated with ultrasound and found to be widely patent. An image was obtained and stored for the medical record. Local anesthesia was attained by infiltration with 1% lidocaine. A small dermatotomy was made. Under real-time sonographic guidance, the vessel was punctured with a 21 gauge micropuncture needle. Using standard technique, the initial micro needle was exchanged over a 0.018 micro wire  for a transitional 4 [place] micro sheath. The micro sheath was then exchanged over a 0.035 wire for a 5 French vascular sheath. A C2 cobra catheter was advanced in the abdominal aorta over a Bentson wire. The catheter was used to select the first vessel arising from the ventral aorta. Contrast was injected. This is a left gastric artery arising directly from the aorta. Anatomy is normal. No evidence of active bleeding. The Cobra catheter was then used to select the origin of the celiac artery. Arteriography was performed. The splenic artery is small in caliber. The hepatic artery demonstrates normal hepatic arterial anatomy. The gastroduodenal artery is slightly irregular with spasm in the proximal segment. No evidence of active extravasation. A glidewire was successfully advanced into the common hepatic artery and the C2 cobra catheter was advanced into the common hepatic artery. Additional arteriography was performed in multiple obliquities. Again, the proximal gastroduodenal artery is irregular and spasmodic. No evidence of dissection, aneurysm or active bleeding. A Lantern microcatheter was then advanced over a Fathom 16 wire and used to select the distal aspect of the gastroduodenal artery. Arteriography was performed. The gastroepiploic arteries are widely patent. No evidence of active bleeding. Coil embolization was then performed using a 4 mm Ruby POD detachable micro coil followed by a 45 cm Ruby packing coil. Post embolization arteriography demonstrates complete occlusion of the vessel with stasis in the proximal segment. The Lantern microcatheter was brought back into the common hepatic artery and arteriography was performed. Again, complete occlusion of the gastroduodenal artery. The right gastric artery is visualized. No evidence of active bleeding from the right gastric artery. The microcatheter was removed. The C2 cobra catheter was used to select the superior mesenteric artery. Arteriography was  performed. Patent pancreaticoduodenal arcade supplying the gastroepiploic artery. No evidence of active hemorrhage. The C2 cobra catheter was removed. A limited right common femoral arteriogram was performed confirming common femoral arterial access. Hemostasis was attained with the assistance of a Cordis ExoSeal extra arterial vascular plug. IMPRESSION: 1. No evidence of active hemorrhage. 2. 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 3. Prophylactic coil embolization of the gastroduodenal artery. Signed, Criselda Peaches, MD, [place] [facility] Radiology Electronically Signed   By: Jacqulynn Cadet M.D.   On: 09/07/2018 22:32   Ir Angiogram Selective Each Additional Vessel  Result Date: 09/07/2018 INDICATION: 57 year old male with large volume upper GI bleed secondary to a very large duodenal ulcer. Patient underwent endoscopic intervention but continues to require pressors and large volume transfusion. He presents for mesenteric arteriography and embolization of any active bleeding and prophylactic embolization of the gastroduodenal artery. EXAM: IR ULTRASOUND GUIDANCE VASC ACCESS RIGHT; ADDITIONAL ARTERIOGRAPHY; IR EMBO ART VEN HEMORR LYMPH EXTRAV INC GUIDE ROADMAPPING; SELECTIVE VISCERAL ARTERIOGRAPHY 1. Ultrasound-guided vascular access 2. Catheterization of the left gastric artery where it  arises directly from the aorta with arteriogram 3. Catheterization of the celiac artery with arteriogram 4. Catheterization of the common hepatic artery with arteriogram 5. Catheterization of the gastroduodenal artery with arteriogram 6. Coil embolization of the gastroduodenal artery 7. Catheterization of the superior mesenteric artery with arteriogram MEDICATIONS: None ANESTHESIA/SEDATION: Patient intubated and on propofol drip CONTRAST:  70 mL Isovue 370 FLUOROSCOPY TIME:  Fluoroscopy Time: 11 minutes 30 seconds (487 mGy). COMPLICATIONS: None  immediate. PROCEDURE: Informed consent was obtained from the patient following explanation of the procedure, risks, benefits and alternatives. The patient understands, agrees and consents for the procedure. All questions were addressed. A time out was performed prior to the initiation of the procedure. Maximal barrier sterile technique utilized including caps, mask, sterile gowns, sterile gloves, large sterile drape, hand hygiene, and Betadine prep. The right common femoral artery was interrogated with ultrasound and found to be widely patent. An image was obtained and stored for the medical record. Local anesthesia was attained by infiltration with 1% lidocaine. A small dermatotomy was made. Under real-time sonographic guidance, the vessel was punctured with a 21 gauge micropuncture needle. Using standard technique, the initial micro needle was exchanged over a 0.018 micro wire for a transitional 4 [place] micro sheath. The micro sheath was then exchanged over a 0.035 wire for a 5 French vascular sheath. A C2 cobra catheter was advanced in the abdominal aorta over a Bentson wire. The catheter was used to select the first vessel arising from the ventral aorta. Contrast was injected. This is a left gastric artery arising directly from the aorta. Anatomy is normal. No evidence of active bleeding. The Cobra catheter was then used to select the origin of the celiac artery. Arteriography was performed. The splenic artery is small in caliber. The hepatic artery demonstrates normal hepatic arterial anatomy. The gastroduodenal artery is slightly irregular with spasm in the proximal segment. No evidence of active extravasation. A glidewire was successfully advanced into the common hepatic artery and the C2 cobra catheter was advanced into the common hepatic artery. Additional arteriography was performed in multiple obliquities. Again, the proximal gastroduodenal artery is irregular and spasmodic. No evidence of dissection,  aneurysm or active bleeding. A Lantern microcatheter was then advanced over a Fathom 16 wire and used to select the distal aspect of the gastroduodenal artery. Arteriography was performed. The gastroepiploic arteries are widely patent. No evidence of active bleeding. Coil embolization was then performed using a 4 mm Ruby POD detachable micro coil followed by a 45 cm Ruby packing coil. Post embolization arteriography demonstrates complete occlusion of the vessel with stasis in the proximal segment. The Lantern microcatheter was brought back into the common hepatic artery and arteriography was performed. Again, complete occlusion of the gastroduodenal artery. The right gastric artery is visualized. No evidence of active bleeding from the right gastric artery. The microcatheter was removed. The C2 cobra catheter was used to select the superior mesenteric artery. Arteriography was performed. Patent pancreaticoduodenal arcade supplying the gastroepiploic artery. No evidence of active hemorrhage. The C2 cobra catheter was removed. A limited right common femoral arteriogram was performed confirming common femoral arterial access. Hemostasis was attained with the assistance of a Cordis ExoSeal extra arterial vascular plug. IMPRESSION: 1. No evidence of active hemorrhage. 2. 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 3. Prophylactic coil embolization of the gastroduodenal artery. Signed, Criselda Peaches, MD, [place] [facility] Radiology Electronically Signed   By: Jacqulynn Cadet M.D.   On: 09/07/2018  22:32   Ir Angiogram Selective Each Additional Vessel  Result Date: 09/07/2018 INDICATION: 57 year old male with large volume upper GI bleed secondary to a very large duodenal ulcer. Patient underwent endoscopic intervention but continues to require pressors and large volume transfusion. He presents for mesenteric arteriography and embolization of  any active bleeding and prophylactic embolization of the gastroduodenal artery. EXAM: IR ULTRASOUND GUIDANCE VASC ACCESS RIGHT; ADDITIONAL ARTERIOGRAPHY; IR EMBO ART VEN HEMORR LYMPH EXTRAV INC GUIDE ROADMAPPING; SELECTIVE VISCERAL ARTERIOGRAPHY 1. Ultrasound-guided vascular access 2. Catheterization of the left gastric artery where it arises directly from the aorta with arteriogram 3. Catheterization of the celiac artery with arteriogram 4. Catheterization of the common hepatic artery with arteriogram 5. Catheterization of the gastroduodenal artery with arteriogram 6. Coil embolization of the gastroduodenal artery 7. Catheterization of the superior mesenteric artery with arteriogram MEDICATIONS: None ANESTHESIA/SEDATION: Patient intubated and on propofol drip CONTRAST:  70 mL Isovue 370 FLUOROSCOPY TIME:  Fluoroscopy Time: 11 minutes 30 seconds (487 mGy). COMPLICATIONS: None immediate. PROCEDURE: Informed consent was obtained from the patient following explanation of the procedure, risks, benefits and alternatives. The patient understands, agrees and consents for the procedure. All questions were addressed. A time out was performed prior to the initiation of the procedure. Maximal barrier sterile technique utilized including caps, mask, sterile gowns, sterile gloves, large sterile drape, hand hygiene, and Betadine prep. The right common femoral artery was interrogated with ultrasound and found to be widely patent. An image was obtained and stored for the medical record. Local anesthesia was attained by infiltration with 1% lidocaine. A small dermatotomy was made. Under real-time sonographic guidance, the vessel was punctured with a 21 gauge micropuncture needle. Using standard technique, the initial micro needle was exchanged over a 0.018 micro wire for a transitional 4 [place] micro sheath. The micro sheath was then exchanged over a 0.035 wire for a 5 French vascular sheath. A C2 cobra catheter was advanced in the  abdominal aorta over a Bentson wire. The catheter was used to select the first vessel arising from the ventral aorta. Contrast was injected. This is a left gastric artery arising directly from the aorta. Anatomy is normal. No evidence of active bleeding. The Cobra catheter was then used to select the origin of the celiac artery. Arteriography was performed. The splenic artery is small in caliber. The hepatic artery demonstrates normal hepatic arterial anatomy. The gastroduodenal artery is slightly irregular with spasm in the proximal segment. No evidence of active extravasation. A glidewire was successfully advanced into the common hepatic artery and the C2 cobra catheter was advanced into the common hepatic artery. Additional arteriography was performed in multiple obliquities. Again, the proximal gastroduodenal artery is irregular and spasmodic. No evidence of dissection, aneurysm or active bleeding. A Lantern microcatheter was then advanced over a Fathom 16 wire and used to select the distal aspect of the gastroduodenal artery. Arteriography was performed. The gastroepiploic arteries are widely patent. No evidence of active bleeding. Coil embolization was then performed using a 4 mm Ruby POD detachable micro coil followed by a 45 cm Ruby packing coil. Post embolization arteriography demonstrates complete occlusion of the vessel with stasis in the proximal segment. The Lantern microcatheter was brought back into the common hepatic artery and arteriography was performed. Again, complete occlusion of the gastroduodenal artery. The right gastric artery is visualized. No evidence of active bleeding from the right gastric artery. The microcatheter was removed. The C2 cobra catheter was used to select the superior mesenteric artery. Arteriography was  performed. Patent pancreaticoduodenal arcade supplying the gastroepiploic artery. No evidence of active hemorrhage. The C2 cobra catheter was removed. A limited right  common femoral arteriogram was performed confirming common femoral arterial access. Hemostasis was attained with the assistance of a Cordis ExoSeal extra arterial vascular plug. IMPRESSION: 1. No evidence of active hemorrhage. 2. 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 3. Prophylactic coil embolization of the gastroduodenal artery. Signed, Criselda Peaches, MD, [place] [facility] Radiology Electronically Signed   By: Jacqulynn Cadet M.D.   On: 09/07/2018 22:32   Ir US Guide Vasc Access Right  Result Date: 09/07/2018 INDICATION: 57 year old male with large volume upper GI bleed secondary to a very large duodenal ulcer. Patient underwent endoscopic intervention but continues to require pressors and large volume transfusion. He presents for mesenteric arteriography and embolization of any active bleeding and prophylactic embolization of the gastroduodenal artery. EXAM: IR ULTRASOUND GUIDANCE VASC ACCESS RIGHT; ADDITIONAL ARTERIOGRAPHY; IR EMBO ART VEN HEMORR LYMPH EXTRAV INC GUIDE ROADMAPPING; SELECTIVE VISCERAL ARTERIOGRAPHY 1. Ultrasound-guided vascular access 2. Catheterization of the left gastric artery where it arises directly from the aorta with arteriogram 3. Catheterization of the celiac artery with arteriogram 4. Catheterization of the common hepatic artery with arteriogram 5. Catheterization of the gastroduodenal artery with arteriogram 6. Coil embolization of the gastroduodenal artery 7. Catheterization of the superior mesenteric artery with arteriogram MEDICATIONS: None ANESTHESIA/SEDATION: Patient intubated and on propofol drip CONTRAST:  70 mL Isovue 370 FLUOROSCOPY TIME:  Fluoroscopy Time: 11 minutes 30 seconds (487 mGy). COMPLICATIONS: None immediate. PROCEDURE: Informed consent was obtained from the patient following explanation of the procedure, risks, benefits and alternatives. The patient understands, agrees and  consents for the procedure. All questions were addressed. A time out was performed prior to the initiation of the procedure. Maximal barrier sterile technique utilized including caps, mask, sterile gowns, sterile gloves, large sterile drape, hand hygiene, and Betadine prep. The right common femoral artery was interrogated with ultrasound and found to be widely patent. An image was obtained and stored for the medical record. Local anesthesia was attained by infiltration with 1% lidocaine. A small dermatotomy was made. Under real-time sonographic guidance, the vessel was punctured with a 21 gauge micropuncture needle. Using standard technique, the initial micro needle was exchanged over a 0.018 micro wire for a transitional 4 [place] micro sheath. The micro sheath was then exchanged over a 0.035 wire for a 5 French vascular sheath. A C2 cobra catheter was advanced in the abdominal aorta over a Bentson wire. The catheter was used to select the first vessel arising from the ventral aorta. Contrast was injected. This is a left gastric artery arising directly from the aorta. Anatomy is normal. No evidence of active bleeding. The Cobra catheter was then used to select the origin of the celiac artery. Arteriography was performed. The splenic artery is small in caliber. The hepatic artery demonstrates normal hepatic arterial anatomy. The gastroduodenal artery is slightly irregular with spasm in the proximal segment. No evidence of active extravasation. A glidewire was successfully advanced into the common hepatic artery and the C2 cobra catheter was advanced into the common hepatic artery. Additional arteriography was performed in multiple obliquities. Again, the proximal gastroduodenal artery is irregular and spasmodic. No evidence of dissection, aneurysm or active bleeding. A Lantern microcatheter was then advanced over a Fathom 16 wire and used to select the distal aspect of the gastroduodenal artery. Arteriography was  performed. The gastroepiploic arteries are widely patent. No evidence  of active bleeding. Coil embolization was then performed using a 4 mm Ruby POD detachable micro coil followed by a 45 cm Ruby packing coil. Post embolization arteriography demonstrates complete occlusion of the vessel with stasis in the proximal segment. The Lantern microcatheter was brought back into the common hepatic artery and arteriography was performed. Again, complete occlusion of the gastroduodenal artery. The right gastric artery is visualized. No evidence of active bleeding from the right gastric artery. The microcatheter was removed. The C2 cobra catheter was used to select the superior mesenteric artery. Arteriography was performed. Patent pancreaticoduodenal arcade supplying the gastroepiploic artery. No evidence of active hemorrhage. The C2 cobra catheter was removed. A limited right common femoral arteriogram was performed confirming common femoral arterial access. Hemostasis was attained with the assistance of a Cordis ExoSeal extra arterial vascular plug. IMPRESSION: 1. No evidence of active hemorrhage. 2. 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 3. Prophylactic coil embolization of the gastroduodenal artery. Signed, Criselda Peaches, MD, [place] [facility] Radiology Electronically Signed   By: Jacqulynn Cadet M.D.   On: 09/07/2018 22:32   Dg Chest Port 1 View  Result Date: 09/09/2018 CLINICAL DATA:  Respiratory failure EXAM: PORTABLE CHEST 1 VIEW COMPARISON:  09/07/2018 FINDINGS: Endotracheal tube and nasogastric catheter have been removed in the interval. Left jugular central line is again noted in the distal superior vena cava. The cardiac shadow is stable. The lungs are well aerated bilaterally with patchy bibasilar infiltrates and small left pleural effusion. IMPRESSION: Stable bibasilar infiltrates and small left pleural effusion. Electronically  Signed   By: Inez Catalina M.D.   On: 09/09/2018 07:17   Dg Chest Port 1 View  Result Date: 09/07/2018 CLINICAL DATA:  Status post fasciotomy and hematoma back UA shins in the right lower leg 2 days ago. Intubated patient. EXAM: PORTABLE CHEST 1 VIEW COMPARISON:  CT scan of the chest of September 06, 2018 FINDINGS: The lungs are well-expanded. There are patchy increased densities at both bases which are slightly more conspicuous today. There is a small right and and somewhat larger left pleural effusion. The heart and pulmonary vascularity are normal. The endotracheal tube tip projects 4.1 cm above the carina. The esophagogastric tube tip in proximal port project below the GE junction. The left internal jugular venous catheter tip projects over the midportion of the SVC. IMPRESSION: Bibasilar atelectasis or pneumonia. Small right pleural effusion and somewhat larger left pleural effusion. The support tubes are in reasonable position. Electronically Signed   By: David  Martinique M.D.   On: 09/07/2018 13:27   Vas [place] Burnard Bunting With/wo Tbi  Result Date: 09/15/2018 LOWER EXTREMITY DOPPLER STUDY Indications: Peripheral artery disease.  Vascular Interventions: Status post right popliteal - tibial embolectomy and                         fasciotomy. Comparison Study: No exam for comparison Performing Technologist: Birdena Crandall, [place] [facility]  Examination Guidelines: A complete evaluation includes at minimum, Doppler waveform signals and systolic blood pressure reading at the level of bilateral brachial, anterior tibial, and posterior tibial arteries, when vessel segments are accessible. Bilateral testing is considered an integral part of a complete examination. Photoelectric Plethysmograph (PPG) waveforms and toe systolic pressure readings are included as required and additional duplex testing as needed. Limited examinations for reoccurring indications may be performed as noted.  ABI Findings:  +--------+------------------+-----+----------+--------+ Right   Rt Pressure (mmHg)IndexWaveform  Comment  +--------+------------------+-----+----------+--------+ [id number]  triphasic          +--------+------------------+-----+----------+--------+ PTA     179               1.36 triphasic          +--------+------------------+-----+----------+--------+ DP      152               1.15 monophasic         +--------+------------------+-----+----------+--------+ +--------+------------------+-----+---------+-------+ Left    Lt Pressure (mmHg)IndexWaveform Comment +--------+------------------+-----+---------+-------+ [id number]                    triphasic        +--------+------------------+-----+---------+-------+ PTA     176               1.33 triphasic        +--------+------------------+-----+---------+-------+ DP      162               1.23 triphasic        +--------+------------------+-----+---------+-------+ +-------+-----------+-----------+------------+------------+ ABI/TBIToday's ABIToday's TBIPrevious ABIPrevious TBI +-------+-----------+-----------+------------+------------+ Right  1.36                                           +-------+-----------+-----------+------------+------------+ Left   1.33                                           +-------+-----------+-----------+------------+------------+  Summary: Right: Resting right ankle-brachial index indicates noncompressible right lower extremity arteries. Left: Resting left ankle-brachial index indicates noncompressible left lower extremity arteries.  *See table(s) above for measurements and observations.  Electronically signed by Harold Barban MD on 09/15/2018 at 1:50:47 PM.   Final    Ct Renal Stone Study  Result Date: 08/22/2018 CLINICAL DATA:  Left flank pain. Nausea vomiting for 4 days. Previous appendectomy. EXAM: CT ABDOMEN AND PELVIS WITHOUT CONTRAST TECHNIQUE:  Multidetector CT imaging of the abdomen and pelvis was performed following the standard protocol without IV contrast. COMPARISON:  CT scan December 19, 2017 FINDINGS: Lower chest: No acute abnormality. Hepatobiliary: No focal liver abnormality is seen. No gallstones, gallbladder wall thickening, or biliary dilatation. Pancreas: Unremarkable. No pancreatic ductal dilatation or surrounding inflammatory changes. Spleen: Normal in size without focal abnormality. Adrenals/Urinary Tract: Adrenal glands are normal. A 3 mm stone is again noted in the mid right kidney. No hydronephrosis or perinephric stranding. Bilateral extrarenal pelvises. No ureterectasis or ureteral stones noted. The bladder is unremarkable. Stomach/Bowel: The stomach is distended. Small bowel is normal. Moderate to severe fecal loading throughout the colon with a stool ball in the rectum. By report, the patient is status post appendectomy. 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 Atherosclerotic changes seen in the aorta, iliac vessels, and femoral vessels. Reproductive: Prostate is unremarkable. Other: No abdominal wall hernia or abnormality. No abdominopelvic ascites. Musculoskeletal: No acute or significant osseous findings. IMPRESSION: 1. Nonobstructive 3 mm stone in the right kidney. No renal obstruction or other stones noted. 2. Gastric distention of uncertain etiology. 3. Moderate to severe fecal loading throughout the colon. 4. Atherosclerotic changes throughout the abdominal aorta and iliac  vessels. 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 Recommend followup by ultrasound in 3 years. This recommendation follows ACR consensus  guidelines: White Paper of the ACR Incidental Findings Committee II on Vascular Findings. Joellyn Rued Radiol 2013; [id number] Electronically Signed   By: Dorise Bullion III M.D   On: 08/22/2018 19:54   Ir Embo Art  [place] Guide Roadmapping  Result Date: 09/07/2018 INDICATION: 57 year old male with large volume upper GI bleed secondary to a very large duodenal ulcer. Patient underwent endoscopic intervention but continues to require pressors and large volume transfusion. He presents for mesenteric arteriography and embolization of any active bleeding and prophylactic embolization of the gastroduodenal artery. EXAM: IR ULTRASOUND GUIDANCE VASC ACCESS RIGHT; ADDITIONAL ARTERIOGRAPHY; IR EMBO ART VEN HEMORR LYMPH EXTRAV INC GUIDE ROADMAPPING; SELECTIVE VISCERAL ARTERIOGRAPHY 1. Ultrasound-guided vascular access 2. Catheterization of the left gastric artery where it arises directly from the aorta with arteriogram 3. Catheterization of the celiac artery with arteriogram 4. Catheterization of the common hepatic artery with arteriogram 5. Catheterization of the gastroduodenal artery with arteriogram 6. Coil embolization of the gastroduodenal artery 7. Catheterization of the superior mesenteric artery with arteriogram MEDICATIONS: None ANESTHESIA/SEDATION: Patient intubated and on propofol drip CONTRAST:  70 mL Isovue 370 FLUOROSCOPY TIME:  Fluoroscopy Time: 11 minutes 30 seconds (487 mGy). COMPLICATIONS: None immediate. PROCEDURE: Informed consent was obtained from the patient following explanation of the procedure, risks, benefits and alternatives. The patient understands, agrees and consents for the procedure. All questions were addressed. A time out was performed prior to the initiation of the procedure. Maximal barrier sterile technique utilized including caps, mask, sterile gowns, sterile gloves, large sterile drape, hand hygiene, and Betadine prep. The right common femoral artery was  interrogated with ultrasound and found to be widely patent. An image was obtained and stored for the medical record. Local anesthesia was attained by infiltration with 1% lidocaine. A small dermatotomy was made. Under real-time sonographic guidance, the vessel was punctured with a 21 gauge micropuncture needle. Using standard technique, the initial micro needle was exchanged over a 0.018 micro wire for a transitional 4 [place] micro sheath. The micro sheath was then exchanged over a 0.035 wire for a 5 French vascular sheath. A C2 cobra catheter was advanced in the abdominal aorta over a Bentson wire. The catheter was used to select the first vessel arising from the ventral aorta. Contrast was injected. This is a left gastric artery arising directly from the aorta. Anatomy is normal. No evidence of active bleeding. The Cobra catheter was then used to select the origin of the celiac artery. Arteriography was performed. The splenic artery is small in caliber. The hepatic artery demonstrates normal hepatic arterial anatomy. The gastroduodenal artery is slightly irregular with spasm in the proximal segment. No evidence of active extravasation. A glidewire was successfully advanced into the common hepatic artery and the C2 cobra catheter was advanced into the common hepatic artery. Additional arteriography was performed in multiple obliquities. Again, the proximal gastroduodenal artery is irregular and spasmodic. No evidence of dissection, aneurysm or active bleeding. A Lantern microcatheter was then advanced over a Fathom 16 wire and used to select the distal aspect of the gastroduodenal artery. Arteriography was performed. The gastroepiploic arteries are widely patent. No evidence of active bleeding. Coil embolization was then performed using a 4 mm Ruby POD detachable micro coil followed by a  45 cm Ruby packing coil. Post embolization arteriography demonstrates complete occlusion of the vessel with stasis in the  proximal segment. The Lantern microcatheter was brought back into the common hepatic artery and arteriography was performed. Again, complete occlusion of the gastroduodenal artery. The right gastric artery is visualized. No evidence of active bleeding from the right gastric artery. The microcatheter was removed. The C2 cobra catheter was used to select the superior mesenteric artery. Arteriography was performed. Patent pancreaticoduodenal arcade supplying the gastroepiploic artery. No evidence of active hemorrhage. The C2 cobra catheter was removed. A limited right common femoral arteriogram was performed confirming common femoral arterial access. Hemostasis was attained with the assistance of a Cordis ExoSeal extra arterial vascular plug. IMPRESSION: 1. No evidence of active hemorrhage. 2. 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 3. Prophylactic coil embolization of the gastroduodenal artery. Signed, Criselda Peaches, MD, [facility] Electronically Signed   By: Jacqulynn Cadet M.D.   On: 09/07/2018 22:32   Ct Angio Abd/pel W/ And/or W/o  Result Date: 09/11/2018 CLINICAL DATA:  Status postoperative right popliteal and tibial embolectomy for embolic disease. Known bilateral iliac artery aneurysmal disease. EXAM: CT ANGIOGRAPHY ABDOMEN AND PELVIS WITH CONTRAST TECHNIQUE: Multidetector CT imaging of the abdomen and pelvis was performed using the standard protocol during bolus administration of intravenous contrast. Multiplanar reconstructed images and MIPs were obtained and reviewed to evaluate the vascular anatomy. CONTRAST:  148m ISOVUE-370 IOPAMIDOL (ISOVUE-370) INJECTION 76% COMPARISON:  CTA of the abdomen on 01/30/2018 and CT of the abdomen and pelvis with contrast on 12/19/2017. FINDINGS: VASCULAR Aorta: Stable mild dilatation of the infrarenal aorta with maximal diameter of 3.1 cm. Celiac: Mild origin stenosis of  25-30%. Distal branches are patent. Status post transcatheter coil embolization of the gastroduodenal artery on 09/07/2018. Hepatic arteries are normally patent. SMA: Normally patent. Renals: Bilateral single renal arteries demonstrate normal patency. IMA: Normally patent. Inflow: Fusiform aneurysmal disease present of bilateral common iliac arteries as previously demonstrated. Maximal caliber of the right common iliac artery is 2.8-2.9 cm and maximal caliber of the right common iliac artery is 2.7 cm. These measurements are stable since prior studies. The significant change since the prior studies is a much more prominent and new component of mural thrombus in both common iliac arteries, right greater than left. In the proximal right common iliac artery, mural thrombus narrows the aneurysmal lumen by at least 50% in its narrowest portion. Eccentric mural thrombus is present at the origin of the right common iliac artery and also distally just prior to the common iliac bifurcation without significant luminal stenosis. Aneurysmal disease again noted of bilateral internal iliac arteries with the left measuring approximately 2.5 cm in greatest diameter and the right measuring 1.5 cm. Both internal iliac arteries also demonstrate significant mural thrombus without occlusion. Mural thrombus is stable to slightly more prominent compared to the 12/19/2017 study. Bilateral external iliac arteries are normally patent and demonstrate diffuse arteriomegaly with maximum caliber 1.5 cm. Proximal Outflow: Bilateral common femoral arteries demonstrate dilatation with the left measuring up to 2.1 cm in diameter. Mural thrombus is present along the posterior lumen with approximately 50% reduction in luminal diameter. No thrombus is seen extending into proximal SFA or profunda femoral arteries. On the right side the common femoral artery measures up to 1.8 cm in the femoral bifurcation appears widely patent. Veins: Delayed venous  phase imaging shows patent venous structures without evidence of thrombus. There is some probable mass effect  of ectatic external iliac arteries on the distal aspects of bilateral external iliac veins without evidence of associated deep venous thrombosis or significant collateral vein formation. Review of the MIP images confirms the above findings. NON-VASCULAR Lower chest: Small bilateral pleural effusions with bibasilar atelectasis. Hepatobiliary: No focal liver abnormality is seen. No gallstones, gallbladder wall thickening, or biliary dilatation. Pancreas: Since the March study, there is a change in appearance of the pancreas on the venous phase of imaging. The tail of the pancreas now appears progressively atrophic with associated pancreatic ductal dilatation up to approximately 7 mm. At the junction of the body and head of the pancreas, there is suggestion a potential ill-defined low-attenuation mass that could measure as much as 3 cm in AP diameter. Borders are very difficult to delineate by CT. There is no associated biliary obstruction. 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 Recommend eventual correlation with MRI of the abdomen with and without gadolinium. There may be a window of time recommended to wait to perform MRI after placement of the gastroduodenal artery Ruby coils. This can be further investigated based on manufacturer recommendation. If there may be a significant delay in ability to perform MRI, consider endoscopic ultrasound to investigate the pancreas. Spleen: Normal in size without focal abnormality. Adrenals/Urinary Tract: Adrenal glands are unremarkable. Kidneys are normal, without renal calculi, focal lesion, or hydronephrosis. Bladder is unremarkable. Stomach/Bowel: Gastric fold thickening and prominent fold thickening of the duodenum may be consistent with known peptic ulcer disease. No evidence of bowel obstruction or free intraperitoneal air. No focal abscess identified.  Lymphatic: No enlarged lymph nodes identified. Reproductive: Prostate is unremarkable. Other: Diffuse anasarca of the body wall and edema of the mesenteric and retroperitoneal fat likely relate to low albumin and malnutrition. Musculoskeletal: Moderate degenerative disc disease at L5-S1. IMPRESSION: VASCULAR 1. 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 No complications evident after embolization. 2. Stable mild aneurysmal disease of the distal abdominal aorta measuring 3.1 cm. 3. 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 The major change since prior imaging is significant increase in mural thrombus within the dilated common iliac arteries bilaterally, especially on the right. This is most likely the source distal emboli to the right distal leg. Mural thrombus in the internal iliac artery aneurysms is stable to slightly more prominent. 4. Stable diffuse enlargement bilateral external iliac and common femoral arteries. The left common femoral artery does contain significant posterior wall mural thrombus. NON-VASCULAR 1. The most significant nonvascular finding is potential development of a mass of the pancreas at the juncture of the head and body causing progressive atrophy of the tail of the pancreas with associated pancreatic ductal dilatation. On the venous phase of imaging, delineation of the potential mass is quite vague but the region of mass may measure as much as 3 cm. There may be some issue with immediate performance of MRI in the setting recent coiling of the gastroduodenal artery even if the coils are MRI compatible. This can be checked with the manufacturer recommendation. If MRI can be performed immediately, an MRI of the abdomen with and without gadolinium would be helpful for further evaluation. If there is going to be significant delay, consider EUS characterization of the pancreas. 2. Diffuse body wall anasarca and edema of  the mesenteric fat and retroperitoneum likely reflects low albumin and malnutrition. 3. Prominent gastric folds and duodenal folds without evidence of bowel obstruction or perforation. Electronically Signed   By: Jenness [place].D.  On: 09/11/2018 12:36     LOS: 18 days   Signature  Lala Lund M.D on 09/21/2018 at 11:53 AM   -  To page go to [email or web address] - password [facility]

## 2018-09-21 NOTE — Progress Notes (Signed)
Inpatient Diabetes Program Recommendations  AACE/ADA: New Consensus Statement on Inpatient Glycemic Control (2015)  Target Ranges:  Prepandial:   less than 140 mg/dL      Peak postprandial:   less than 180 mg/dL (1-2 hours)      Critically ill patients:  140 - 180 mg/dL   Lab Results  Component Value Date   GLUCAP 246 (H) 09/21/2018   HGBA1C 18.2 (H) 08/23/2018    Review of Glycemic Control Results for Ryan Medina, Ryan Medina (MRN [id number]) as of 09/21/2018 09:30  Ref. Range 09/20/2018 11:45 09/20/2018 17:18 09/20/2018 21:47 09/21/2018 07:49  Glucose-Capillary Latest Ref Range: 70 - 99 mg/dL 162 (H) 269 (H) 184 (H) 246 (H)   Diabetes history:DM 2 Outpatient Diabetes medications: Levemir 30 units q AM and 20 units q PM, Novolog 0-15 units tid with meals, Metformin 1000 mg bid Current orders for Inpatient glycemic control: Novolog sensitive tid with meals and 0-5 units QHS, Lantus 7 units daily  Inpatient Diabetes Program Recommendations:  Consider increasing basal insulin to Lantus 6 units BID.  Additionally, if post prandials exceed 180 mg/dL, consider adding Novolog 3 units TID (assuming that patient is consuming >50% of meal).  Thanks, Bronson Curb, MSN, RNC-OB Diabetes Coordinator [phone] (8a-5p)

## 2018-09-21 NOTE — Progress Notes (Signed)
OT Cancellation Note  Patient Details Name: Ryan Medina MRN: [id number] DOB: 1961/06/27   Cancelled Treatment:    Reason Eval/Treat Not Completed: Other (comment) Pt sleeping heavily upon OT arrival, will check back as schedule permits to continue OT POC.   Zenovia Jarred, MSOT, OTR/L Behavioral Health OT/ Acute Relief OT [facility] Office: [place] 09/21/2018, 2:31 PM

## 2018-09-21 NOTE — Progress Notes (Signed)
Blood transfusion started at 0539 hrs.,  infusing @ 150cc/hr.  Pt Afebrile with no s/s of adverse reaction  noted

## 2018-09-21 NOTE — Progress Notes (Signed)
PHARMACIST - PHYSICIAN COMMUNICATION  DR:   Candiss Norse  CONCERNING: IV to Oral Route Change Policy  RECOMMENDATION: This patient is receiving Protonix by the intravenous route.  Based on criteria approved by the Pharmacy and Therapeutics Committee, the intravenous medication(s) is/are being converted to the equivalent oral dose form(s).   DESCRIPTION: These criteria include:  The patient is eating (either orally or via tube) and/or has been taking other orally administered medications for a least 24 hours  The patient has no evidence of active gastrointestinal bleeding or impaired GI absorption (gastrectomy, short bowel, patient on TNA or NPO).  If you have questions about this conversion, please contact the Pharmacy Department  []   [phone] )  [facility] []   [phone] )  [facility] [x]   [phone] )  [facility] []   [phone] )  [facility] []   [phone] )  Gonzalez, PharmD, [place], AAHIVP, CPP Infectious Disease Pharmacist 09/21/2018 1:29 PM

## 2018-09-22 LAB — TYPE AND SCREEN
ABO/RH(D): B POS
Antibody Screen: NEGATIVE
Unit division: 0
Unit division: 0

## 2018-09-22 LAB — CBC
HCT: 30.7 % — ABNORMAL LOW (ref 39.0–52.0)
Hemoglobin: 10.1 g/dL — ABNORMAL LOW (ref 13.0–17.0)
MCH: 27.5 pg (ref 26.0–34.0)
MCHC: 32.9 g/dL (ref 30.0–36.0)
MCV: 83.7 fL (ref 80.0–100.0)
NRBC: 0 % (ref 0.0–0.2)
Platelets: 469 10*3/uL — ABNORMAL HIGH (ref 150–400)
RBC: 3.67 MIL/uL — ABNORMAL LOW (ref 4.22–5.81)
RDW: 13.9 % (ref 11.5–15.5)
WBC: 8.1 10*3/uL (ref 4.0–10.5)

## 2018-09-22 LAB — COMPREHENSIVE METABOLIC PANEL
ALT: 21 U/L (ref 0–44)
AST: 16 U/L (ref 15–41)
Albumin: 1.6 g/dL — ABNORMAL LOW (ref 3.5–5.0)
Alkaline Phosphatase: 65 U/L (ref 38–126)
Anion gap: 9 (ref 5–15)
BUN: 10 mg/dL (ref 6–20)
CO2: 31 mmol/L (ref 22–32)
Calcium: 7.9 mg/dL — ABNORMAL LOW (ref 8.9–10.3)
Chloride: 96 mmol/L — ABNORMAL LOW (ref 98–111)
Creatinine, Ser: 0.52 mg/dL — ABNORMAL LOW (ref 0.61–1.24)
GFR calc Af Amer: >60 mL/min (ref >60)
GFR calc non Af Amer: >60 mL/min (ref >60)
Glucose, Bld: 346 mg/dL — ABNORMAL HIGH (ref 70–99)
Potassium: 3.8 mmol/L (ref 3.5–5.1)
Sodium: 136 mmol/L (ref 135–145)
Total Bilirubin: 0.4 mg/dL (ref 0.3–1.2)
Total Protein: 5.3 g/dL — ABNORMAL LOW (ref 6.5–8.1)

## 2018-09-22 LAB — BPAM RBC
BLOOD PRODUCT EXPIRATION DATE: 202001272359
Blood Product Expiration Date: 202001272359
ISSUE DATE / TIME: 201912310532
ISSUE DATE / TIME: 201912310811
Unit Type and Rh: 7300
Unit Type and Rh: 7300

## 2018-09-22 LAB — GLUCOSE, CAPILLARY
GLUCOSE-CAPILLARY: 280 mg/dL — AB (ref 70–99)
Glucose-Capillary: 285 mg/dL — ABNORMAL HIGH (ref 70–99)
Glucose-Capillary: 230 mg/dL — ABNORMAL HIGH (ref 70–99)
Glucose-Capillary: 170 mg/dL — ABNORMAL HIGH (ref 70–99)

## 2018-09-22 MED ORDER — INSULIN GLARGINE 100 UNIT/ML ~~LOC~~ SOLN
9.0000 [IU] | Freq: Every day | SUBCUTANEOUS | Status: DC
  Administered 2018-09-24 – 2018-09-25 (×2): 9 [IU] via SUBCUTANEOUS
  Filled 2018-09-22 (×4): qty 0.09

## 2018-09-22 NOTE — Progress Notes (Signed)
Physical Therapy Treatment Patient Details Name: Ryan Medina MRN: [id number] DOB: Sep 25, 1960 Today's Date: 09/22/2018    History of Present Illness Patient is a 58 y.o. male with PMH including but not limited to DM and HTN who presented to the hospital on 12/13 with a right ischemic leg secondary to acute popliteal and tibial embolus, underwent embolectomy on 12/13 and subsequently was placed on a heparin drip.  He unfortunately developed a acute right lower leg hematoma with compartment syndrome requiring fasciotomy on 12/15.  Further hospital course was complicated by development of hemorrhagic shock with acute blood loss anemia secondary to a bleeding duodenal ulcer-requiring ICU transfer-intubation for airway protection and IR evaluation with mesenteric angiogram and GDA embolization.  Upon stability he was transferred back to the triad hospitalist service on 12/19. Pt is now s/p closure of R LE fasciotomy on 09/10/18. 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    PT Comments    Pt admitted with above diagnosis. Pt currently with functional limitations due to the deficits listed below (see PT Problem List). Pt was able to ambulate to door and back with RW with min guard assist.  Pt with overall good safety awareness.  Pt refused to walk further stating that was painful enough. Taught pt a stretch to keep Right LE from getting contracted as he still won't place the LE on the floor and weight bear.   Pt will benefit from skilled PT to increase their independence and safety with mobility to allow discharge to the venue listed below.     Follow Up Recommendations  SNF;Other (comment)(pt and fiance would like to pursue SNF )     Equipment Recommendations  Rolling walker with 5" wheels;3in1 (PT)    Recommendations for Other Services Rehab consult     Precautions / Restrictions Precautions Precautions: Fall Restrictions Weight Bearing Restrictions: Yes RLE  Weight Bearing: Weight bearing as tolerated    Mobility  Bed Mobility Overal bed mobility: Needs Assistance Bed Mobility: Supine to Sit;Sit to Supine     Supine to sit: Supervision Sit to supine: Supervision   General bed mobility comments: supervision for safety  Transfers Overall transfer level: Needs assistance Equipment used: Rolling walker (2 wheeled) Transfers: Sit to/from Stand Sit to Stand: From elevated surface;Min guard         General transfer comment: cueing for safe hand placement, min guard for safety  Ambulation/Gait Ambulation/Gait assistance: Min guard Gait Distance (Feet): 40 Feet Assistive device: Rolling walker (2 wheeled) Gait Pattern/deviations: Step-to pattern   Gait velocity interpretation: <1.31 ft/sec, indicative of household ambulator General Gait Details: Pt walked to door and back with RW.  Would not put weight on right LE.  Cues also not to pick up RW , to roll it and pt kept picking it up. Overall stability with RW was pretty good.     Stairs             Wheelchair Mobility    Modified Rankin (Stroke Patients Only)       Balance Overall balance assessment: Needs assistance Sitting-balance support: Feet supported;No upper extremity supported Sitting balance-Leahy Scale: Fair     Standing balance support: Bilateral upper extremity supported Standing balance-Leahy Scale: Poor Standing balance comment: reliant on bilateral UEs on RW                            Cognition Arousal/Alertness: Awake/alert Behavior During Therapy: [facility]  for tasks assessed/performed Overall Cognitive Status: Within Functional Limits for tasks assessed                                        Exercises General Exercises - Lower Extremity Ankle Circles/Pumps: AROM;Both;10 reps;Seated Long Arc Quad: AAROM;AROM;Strengthening;Right;15 reps;Seated Other Exercises Other Exercises: Stretch right LE with sheet for calf and  hamstring.    General Comments        Pertinent Vitals/Pain Pain Assessment: Faces Faces Pain Scale: Hurts even more Pain Location: R LE Pain Descriptors / Indicators: Sore;Guarding;Grimacing Pain Intervention(s): Limited activity within patient's tolerance;Monitored during session;Premedicated before session;Repositioned    Home Living                      Prior Function            PT Goals (current goals can now be found in the care plan section) Acute Rehab PT Goals Patient Stated Goal: decrease pain Progress towards PT goals: Progressing toward goals    Frequency    Min 2X/week      PT Plan Current plan remains appropriate;Frequency needs to be updated    Co-evaluation              AM-PAC PT "6 Clicks" Mobility   Outcome Measure  Help needed turning from your back to your side while in a flat bed without using bedrails?: None Help needed moving from lying on your back to sitting on the side of a flat bed without using bedrails?: None Help needed moving to and from a bed to a chair (including a wheelchair)?: A Little Help needed standing up from a chair using your arms (e.g., wheelchair or bedside chair)?: A Little Help needed to walk in hospital room?: A Little Help needed climbing 3-5 steps with a railing? : Total 6 Click Score: 18    End of Session Equipment Utilized During Treatment: Gait belt Activity Tolerance: Patient limited by pain;Patient limited by fatigue Patient left: in bed;with call bell/phone within reach;with family/visitor present;with bed alarm set Nurse Communication: Mobility status PT Visit Diagnosis: Other abnormalities of gait and mobility (R26.89);Pain Pain - Right/Left: Right Pain - part of body: Leg     Time: 1208-1218 PT Time Calculation (min) (ACUTE ONLY): 10 min  Charges:  $Gait Training: 8-22 mins                     [place] Pager:  [phone]  Office:   [place] 09/22/2018, 1:39 PM

## 2018-09-22 NOTE — Progress Notes (Signed)
Occupational Therapy Treatment Patient Details Name: Ryan Medina MRN: [id number] DOB: July 03, 1961 Today's Date: 09/22/2018    History of present illness Patient is a 58 y.o. male with PMH including but not limited to DM and HTN who presented to the hospital on 12/13 with a right ischemic leg secondary to acute popliteal and tibial embolus, underwent embolectomy on 12/13 and subsequently was placed on a heparin drip.  He unfortunately developed a acute right lower leg hematoma with compartment syndrome requiring fasciotomy on 12/15.  Further hospital course was complicated by development of hemorrhagic shock with acute blood loss anemia secondary to a bleeding duodenal ulcer-requiring ICU transfer-intubation for airway protection and IR evaluation with mesenteric angiogram and GDA embolization.  Upon stability he was transferred back to the [facility] on 12/19. Pt is now s/p closure of R LE fasciotomy on 09/10/18. 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   OT comments  Pt making limited progress towards OT goals this session. Pt declined OOB transfer/sink level grooming because "I already walked with that other lady today" Pt agreeable to seated grooming tasks and RLE in dependent position. Continues to be limited by pain. Seated balance challenged for LB ADL tasks - with Pt demonstrating improvement. Discussed establishing an HEP for BUE since he is so reliant on them for transfers, and Pt agreeable - would make an excellent treatment session next time with [facility]. OT will continue to follow acutely. Discharge updated to SNF as Pt and fiance prefer this to CIR.   Follow Up Recommendations  SNF;Supervision/Assistance - 24 hour    Equipment Recommendations  Other (comment)(defer to next venue)    Recommendations for Other Services PT consult    Precautions / Restrictions Precautions Precautions: Fall Restrictions Weight Bearing Restrictions:  Yes RLE Weight Bearing: Weight bearing as tolerated       Mobility Bed Mobility Overal bed mobility: Needs Assistance Bed Mobility: Supine to Sit;Sit to Supine     Supine to sit: Supervision Sit to supine: Supervision   General bed mobility comments: supervision for safety  Transfers                 General transfer comment: deferred this session "I already got up and walked with that other lady"    Balance Overall balance assessment: Needs assistance Sitting-balance support: Feet supported;No upper extremity supported Sitting balance-Leahy Scale: Fair     Standing balance support: Bilateral upper extremity supported Standing balance-Leahy Scale: Poor Standing balance comment: reliant on bilateral UEs on RW                           ADL either performed or assessed with clinical judgement   ADL Overall ADL's : Needs assistance/impaired     Grooming: Set up;Sitting;Wash/dry face;Wash/dry hands;Oral care Grooming Details (indicate cue type and reason): Pt deferred attempting to stand at sink for grooming tasks "I need my hands to help me stand up, and I can't do both at once"                 Toilet Transfer: Min guard;RW             General ADL Comments: pain big factor in participation for transfer training     Vision       Perception     Praxis      Cognition Arousal/Alertness: Awake/alert Behavior During Therapy: WFL for tasks assessed/performed Overall Cognitive Status:  Within Functional Limits for tasks assessed                                          Exercises     Shoulder Instructions       General Comments      Pertinent Vitals/ Pain       Pain Assessment: Faces Faces Pain Scale: Hurts whole lot Pain Location: R LE Pain Descriptors / Indicators: Sore;Guarding;Grimacing Pain Intervention(s): Limited activity within patient's tolerance;Monitored during session;Repositioned  Home Living                                           Prior Functioning/Environment              Frequency  Min 2X/week        Progress Toward Goals  OT Goals(current goals can now be found in the care plan section)  Progress towards OT goals: Progressing toward goals(limited progress)  Acute Rehab OT Goals Patient Stated Goal: decrease pain OT Goal Formulation: With patient Time For Goal Achievement: 09/25/18 Potential to Achieve Goals: Good  Plan Discharge plan needs to be updated    Co-evaluation                 AM-PAC OT "6 Clicks" Daily Activity     Outcome Measure   Help from another person eating meals?: None Help from another person taking care of personal grooming?: A Little Help from another person toileting, which includes using toliet, bedpan, or urinal?: A Little Help from another person bathing (including washing, rinsing, drying)?: A Little Help from another person to put on and taking off regular upper body clothing?: None Help from another person to put on and taking off regular lower body clothing?: A Lot 6 Click Score: 19    End of Session    OT Visit Diagnosis: Unsteadiness on feet (R26.81);Other abnormalities of gait and mobility (R26.89);Muscle weakness (generalized) (M62.81);Pain Pain - Right/Left: Right Pain - part of body: Leg   Activity Tolerance No increased pain   Patient Left in bed;with call bell/phone within reach;with bed alarm set   Nurse Communication Mobility status        Time: 1572-6203 OT Time Calculation (min): 10 min  Charges: OT General Charges $OT Visit: 1 Visit OT Treatments $Self Care/Home Management : 8-22 mins  Hulda Humphrey OTR/L Acute Rehabilitation Services Pager: [phone] Office: [place] 09/22/2018, 5:27 PM

## 2018-09-22 NOTE — Progress Notes (Signed)
PROGRESS NOTE        PATIENT DETAILS Name: Ryan Medina Age: 58 y.o. Sex: male Date of Birth: 1961-01-25 Admit Date: 09/03/2018 Admitting Physician [name], MD [id number], No Pcp Per  Brief Narrative: Patient is a 58 y.o. male with history of cocaine use, chronic systolic heart failure presented to the hospital on 12/13 with a right ischemic leg secondary to acute popliteal and tibial embolus, underwent embolectomy on 12/13 and subsequently was placed on a heparin drip.  He unfortunately developed a acute right lower leg hematoma with compartment syndrome requiring fasciotomy on 12/15.  Further hospital course was complicated by development of hemorrhagic shock with acute blood loss anemia secondary to a bleeding duodenal ulcer-requiring ICU transfer-intubation for airway protection and IR evaluation with mesenteric angiogram and GDA embolization.  Upon stability he was transferred back to the triad hospitalist service on 12/19.  See below for further details.  Basically now waiting for SNF bed.  Subjective:  Patient in bed, appears comfortable, denies any headache, no fever, no chest pain or pressure, no shortness of breath , no abdominal pain. No focal weakness.  Assessment/Plan:  Right ischemic leg secondary to popliteal artery/tibial artery embolism-s/p embolectomy on 38/75 complicated by right lower extremity hematoma with compartment syndrome requiring fasciotomy on 12/15:   Source of embolization unclear, required fasciotomy on 09/05/2018 by vascular surgery status post closure of fasciotomy on 09/10/2018.  His course was complicated by large hematoma in the right leg along with significant life-threatening GI bleed. Case discussed with vascular surgeon Dr. Monica Martinez who wants to continue anticoagulation as much as possible.   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  Kindly see anticoagulation below.   Cardiology was consulted for TEE await report, Cards reminded x 3 (09/22/17) verbally told no clots, EF 65%, negative for PFO.  Upper GI bleeding with hemorrhagic shock and acute blood loss anemia: Developed hemorrhagic shock, was seen by GI, required multiple PRBC transfusions last transfusion on 09/09/2018.  Was seen by GI and eventually underwent gastroduodenal artery embolization on 09/07/2018.  Total 8 units of packed RBC, 1 unit of FFP and 1 unit of platelets transfused around 09/08/2018. GI note noted from 09/10/2018 (okay with heparin challenge).  He was rechallenged with heparin cautiously on 09/12/2018 unfortunately he bled again on 09/14/2018 after which heparin was stopped and he received another 3 units of packed RBCs.  So far he has received 11 units of packed RBCs.  He is getting 12th unit of packed RBC on 09/21/2018 but oncology.  His H&H appears stable.  He also underwent EUS on 09/16/2018 which showed +ve Duodenal Ulcer which was still raw. Continue PPI and Hold Heparin indefinitely.   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  MRI noted & confirms mass, CA 19.9 was elevated, GI seeing and underwent EUS with biopsy on 09/16/2018, biopsy consistent with adenocarcinoma. Oncology following defer further management of this problem to oncology.  Post discharge will follow with Dr. Marin Olp.  Hypokalemia and hypomagnesemia.  Replaced and now stable.    Urinary retention.  On Foley and Flomax.  Foley removed on 09/15/2018.     Acute hypoxic respiratory failure: Intubated when he developed hemorrhagic shock secondary to GI bleeding, successfully extubated-currently stable on 2 L of oxygen via nasal cannula.  Chronic systolic heart failure (EF 45-50% by TTE on  09/04/2018): Reasonably well compensated-follow weights, volume status, electrolytes closely.  Cocaine use/tobacco use: Counseled-I am still not sure if he has any intention of quitting.  3 cm AAA: Stable for follow-up in  the outpatient setting  Insulin-dependent DM-2: Last A1c on 12/2 was 18.2.  Labile sugars with episodes of hypoglycemia, have adjusted Lantus dose and sliding scale on 09/22/18.  We will continue to monitor closely he is also very noncompliant with diet, will monitor.  CBG (last 3)  Recent Labs    09/21/18 1718 09/21/18 2040 09/22/18 0754  GLUCAP 242* 153* 280*      DVT Prophylaxis: Heparin drip had to be stopped on 09/15/2018 due to GI bleed, will place SCDs in the left leg.  Code Status: Full code   Family Communication: Wife at bedside 12/25 & 12/26  Disposition Plan:  SNF once bed available  Antimicrobial agents: Anti-infectives (From admission, onward)   Start     Dose/Rate Route Frequency Ordered Stop   09/10/18 2130  ceFAZolin (ANCEF) IVPB 2g/100 mL premix     2 g 200 mL/hr over 30 Minutes Intravenous Every 8 hours 09/10/18 1634 09/11/18 0700   09/10/18 0800  ceFAZolin (ANCEF) IVPB 1 g/50 mL premix    Note to Pharmacy:  Send with pt to OR   1 g 100 mL/hr over 30 Minutes Intravenous On call 09/09/18 0756 09/10/18 1407   09/07/18 1300  erythromycin 250 mg in sodium chloride 0.9 % 100 mL IVPB     250 mg 100 mL/hr over 60 Minutes Intravenous Once 09/07/18 1125 09/07/18 2351   09/06/18 0000  ceFAZolin (ANCEF) IVPB 1 g/50 mL premix    Note to Pharmacy:  Send with pt to OR   1 g 100 mL/hr over 30 Minutes Intravenous On call 09/05/18 0849 09/05/18 0952   09/03/18 2245  ceFAZolin (ANCEF) IVPB 2g/100 mL premix     2 g 200 mL/hr over 30 Minutes Intravenous Every 8 hours 09/03/18 2236 09/04/18 1444   09/03/18 1615  ceFAZolin (ANCEF) IVPB 2g/100 mL premix  Status:  Discontinued     2 g 200 mL/hr over 30 Minutes Intravenous On call to O.R. 09/03/18 1600 09/03/18 2035      Procedures:  12/17>>Mesenteric angiogram and coil embolization of the GDA  12/17>>EGD  12/15>> 1.  Evacuation hematoma right leg 2.  Evacuation of lymphocele right leg 3.  4 compartment  fasciotomy 4.  Placement of VAC (medial and lateral)  12/13>> 1.  Ultrasound-guided access to the left common femoral artery 2.  Aortogram with bilateral iliac arteriogram 3.  Selective catheterization of the right external iliac artery with right lower extremity runoff  MRI Abd.  Pancreatic mass.   TTE - Left ventricle: Wall thickness was increased in a pattern of mild LVH. Systolic function was mildly reduced. The estimated ejection fraction was in the range of 45% to 50%. Wall motion was normal; there were no regional wall motion abnormalities. Doppler parameters are consistent with abnormal left ventricular relaxation (grade 1 diastolic dysfunction). - Aortic valve: There was trivial regurgitation. - Mitral valve: Mildly calcified annulus. There was mild regurgitation. - Right atrium: Central venous pressure (est): 3 mm Hg. - Tricuspid valve: There was mild regurgitation. - Pulmonary arteries: Systolic pressure could not be accurately estimated. - Pericardium, extracardiac: There was no pericardial effusion.   TEE -   EUS  Impression:  - 3.6cm by 2.1cm mass in the neck of pancreas that surrounds the celiac trunk, obstructs the main pancreatic duct  and appears to have also invaded the duodenal bulb created a large, malignant, friable ulcer. I biopsied the abnormal, firm mucosa surrounding the duodenal ulcer and sampled the pancreatic mass with transgastric EUS FNA. Preliminary cytology review from the pancreatic mass was positive for malignancy.   Recommendation   - Return patient to hospital [facility] for ongoing care. - He has recieved 13 units of blood this admission, 3 of them in the past 24 hours. He will continue to ooze from the duodenal ulcer that I suspect is malignant, related to the nearby large pancreatic mass. Judicious use of bloodthinners is strongly recommended. - Await final cytology/pathology reports; both hopefully available tomorrow.        CONSULTS:   pulmonary/intensive care, vascular surgery and IR, GI, Cards  Time spent: 40 minutes-Greater than 50% of this time was spent in counseling, explanation of diagnosis, planning of further management, and coordination of care.  MEDICATIONS: Scheduled Meds: . atorvastatin  20 mg Oral Daily  . chlorhexidine gluconate (MEDLINE KIT)  15 mL Mouth Rinse BID  . insulin aspart  0-5 Units Subcutaneous QHS  . insulin aspart  0-9 Units Subcutaneous TID WC  . insulin glargine  7 Units Subcutaneous Daily  . lidocaine  15 mL Oral Once  . mouth rinse  15 mL Mouth Rinse BID  . pantoprazole  40 mg Oral BID  . tamsulosin  0.4 mg Oral Daily   Continuous Infusions: . sodium chloride     PRN Meds:.sodium chloride, alum & mag hydroxide-simeth, famotidine, ipratropium-albuterol, LORazepam, morphine injection, [DISCONTINUED] ondansetron **OR** ondansetron (ZOFRAN) IV, oxyCODONE-acetaminophen, polyethylene glycol, senna-docusate, sodium chloride flush, zolpidem   PHYSICAL EXAM: Vital signs: Vitals:   09/21/18 0835 09/21/18 1156 09/21/18 2042 09/22/18 0548  BP: (!) 137/98 128/88 (!) 135/91 (!) 132/93  Pulse:  91 84 90  Resp: _0 Temp: 98.3 F (36.8 C) 98.5 F (36.9 C) 98.6 F (37 C) 98 F (36.7 C)  TempSrc: Oral Oral Oral Oral  SpO2: 100% 100% 100% 100%  Weight:      Height:       Filed Weights   09/03/18 1650 09/16/18 1325  Weight: 66.7 kg 66.7 kg   Body mass index is 20.51 kg/m.   Exam  Awake Alert, Oriented X 3, No new F.N deficits, Normal affect [place].AT,PERRAL Supple Neck,No JVD, No cervical lymphadenopathy appriciated.  Symmetrical Chest wall movement, Good air movement bilaterally, CTAB RRR,No Gallops, Rubs or new Murmurs, No Parasternal Heave +ve B.Sounds, Abd Soft, No tenderness, No organomegaly appriciated, No rebound - guarding or rigidity. No Cyanosis, Clubbing or edema, No new Rash or bruise  I have personally reviewed following labs and imaging studies  LABORATORY  DATA: CBC: Recent Labs  Lab 09/18/18 0426 09/19/18 0356 09/20/18 0421 09/21/18 0350 09/22/18 0436  WBC 19.4* 14.0* 10.1 7.9 8.1  HGB 8.6* 8.4* 8.2* 8.4* 10.1*  HCT 25.6* 25.2* 25.6* 26.0* 30.7*  MCV 84.5 85.4 85.6 85.5 83.7  PLT 382 390 393 444* 469*    Basic Metabolic Panel: Recent Labs  Lab 09/16/18 0550 09/17/18 0358 09/18/18 0426 09/19/18 0356 09/20/18 0421 09/21/18 0350 09/22/18 0436  NA 134* 135 132* 133* 132* 136 136  K 3.8 3.4* 3.9 3.4* 3.8 4.2 3.8  CL 102 101 99 98 96* 100 96*  CO2 _1 GLUCOSE 138* 68* 101* 235* 264* 262* 346*  BUN _2 CREATININE 0.69 0.62 0.59* 0.64 0.61 0.48*  0.52*  CALCIUM 7.5* 7.5* 7.5* 7.6* 7.6* 7.8* 7.9*  MG 1.7 1.6* 1.8  --  1.9  --   --     GFR: Estimated Creatinine Clearance: 96.1 mL/min (A) (by C-G formula based on SCr of 0.52 mg/dL (L)).  Liver Function Tests: Recent Labs  Lab 09/18/18 0426 09/19/18 0356 09/20/18 0421 09/21/18 0350 09/22/18 0436  AST 19 16 14* 15 16  ALT _0 ALKPHOS 69 70 65 64 65  BILITOT 0.6 0.4 0.4 0.3 0.4  PROT 4.6* 4.8* 4.8* 5.2* 5.3*  ALBUMIN 1.6* 1.5* 1.6* 1.6* 1.6*   No results for input(s): LIPASE, AMYLASE in the last 168 hours. No results for input(s): AMMONIA in the last 168 hours.  Coagulation Profile: No results for input(s): INR, PROTIME in the last 168 hours.  Cardiac Enzymes: No results for input(s): CKTOTAL, CKMB, CKMBINDEX, TROPONINI in the last 168 hours.  BNP (last 3 results) No results for input(s): PROBNP in the last 8760 hours.  HbA1C: No results for input(s): HGBA1C in the last 72 hours.  CBG: Recent Labs  Lab 09/21/18 0749 09/21/18 1240 09/21/18 1718 09/21/18 2040 09/22/18 0754  GLUCAP 246* 163* 242* 153* 280*    Lipid Profile: No results for input(s): CHOL, HDL, LDLCALC, TRIG, CHOLHDL, LDLDIRECT in the last 72 hours.  Thyroid Function Tests: No results for input(s): TSH, T4TOTAL, FREET4, T3FREE, THYROIDAB in  the last 72 hours.  Anemia Panel: Recent Labs    09/21/18 0350  FERRITIN 112  TIBC 190*  IRON 8*    Urine analysis:    Component Value Date/Time   COLORURINE YELLOW 09/18/2018 1152   APPEARANCEUR HAZY (A) 09/18/2018 1152   LABSPEC 1.011 09/18/2018 1152   PHURINE 6.0 09/18/2018 1152   GLUCOSEU NEGATIVE 09/18/2018 1152   HGBUR NEGATIVE 09/18/2018 1152   BILIRUBINUR NEGATIVE 09/18/2018 1152   KETONESUR NEGATIVE 09/18/2018 1152   PROTEINUR NEGATIVE 09/18/2018 1152   UROBILINOGEN 4.0 (H) 12/05/2016 0921   NITRITE NEGATIVE 09/18/2018 1152   LEUKOCYTESUR SMALL (A) 09/18/2018 1152    Sepsis Labs: Lactic Acid, Venous    Component Value Date/Time   LATICACIDVEN 1.7 09/07/2018 1600    MICROBIOLOGY: No results found for this or any previous visit (from the past 240 hour(s)).  RADIOLOGY STUDIES/RESULTS: Dg Chest 2 View  Result Date: 09/18/2018 CLINICAL DATA:  58 year old former smoker who had a fever and shortness of breath yesterday. Breathing has improved significantly after nebulizer treatment. Follow-up pneumonia. EXAM: CHEST - 2 VIEW COMPARISON:  09/09/2018 and earlier, including CT chest 09/06/2018. FINDINGS: Cardiac silhouette normal in size, unchanged. Thoracic aorta mildly tortuous, unchanged. Hilar and mediastinal contours otherwise unremarkable. Mild emphysematous changes in both lungs as noted on the recent CT. Linear atelectasis or developing scar in the LEFT LOWER LOBE at the site of the recent pneumonia. Lungs otherwise clear. No localized airspace consolidation. No pleural effusions. No pneumothorax. Normal pulmonary vascularity. Visualized bony thorax intact. IMPRESSION: COPD/emphysema. Linear atelectasis or developing scar in the LEFT LOWER LOBE at the site of the recent pneumonia. No acute cardiopulmonary disease otherwise. Electronically Signed   By: Evangeline Dakin M.D.   On: 09/18/2018 12:41   Dg Abd 1 View  Result Date: 09/07/2018 CLINICAL DATA:  OG tube  placement. EXAM: ABDOMEN - 1 VIEW COMPARISON:  CT abdomen pelvis 08/22/2018. FINDINGS: OG tube is in place with the side port and tip in the body of the stomach. The stomach is distended. IMPRESSION: OG tube in good position. Electronically  Signed   By: Inge Rise M.D.   On: 09/07/2018 13:31   Ct Angio Chest Pe W Or Wo Contrast  Result Date: 09/06/2018 CLINICAL DATA:  Evaluate thoracic aortic aneurysm. EXAM: CT ANGIOGRAPHY CHEST WITH CONTRAST TECHNIQUE: Multidetector CT imaging of the chest was performed using the standard protocol during bolus administration of intravenous contrast. Multiplanar CT image reconstructions and MIPs were obtained to evaluate the vascular anatomy. CONTRAST:  65 mL ISOVUE-370 IOPAMIDOL (ISOVUE-370) INJECTION 76% COMPARISON:  01/30/2018; 10/14/2016; 04/25/2026 FINDINGS: Vascular Findings: No evidence of thoracic aortic aneurysm with measurements as follows. No definite evidence of thoracic aortic dissection or periaortic stranding on this nongated examination. Bovine configuration of the aortic arch. The branch vessels of the aortic arch appear widely patent throughout their imaged courses. The descending thoracic aorta is of normal caliber and widely patent without hemodynamically significant stenosis. There is a very minimal amount of atherosclerotic plaque involving the proximal aspect of the descending thoracic aorta. Cardiomegaly.  No pericardial effusion. Although this examination was not tailored for the evaluation the pulmonary arteries, there are no discrete filling defects within the central pulmonary arterial tree to suggest central pulmonary embolism. Borderline enlarged caliber the main pulmonary artery measuring 32 mm in diameter (image 67, series 6) ------------------------------------------------------------- Thoracic aortic measurements: Sinotubular junction 33 mm as measured in greatest oblique coronal dimension. Proximal ascending aorta 35 mm as measured in  greatest oblique axial dimension at the level of the main pulmonary artery (image 70, series 6); and approximately 36 mm in greatest oblique short axis coronal diameter (coronal image 48, series 9). Aortic arch aorta 28 mm as measured in greatest oblique sagittal dimension. Proximal descending thoracic aorta 30 mm as measured in greatest oblique axial dimension at the level of the main pulmonary artery. Distal descending thoracic aorta 29 mm as measured in greatest oblique axial dimension at the level of the diaphragmatic hiatus. Review of the MIP images confirms the above findings. ------------------------------------------------------------- Non-Vascular Findings: Mediastinum/Lymph Nodes: Prominent right hilar lymph nodes are not enlarged by size criteria with index right suprahilar lymph node measuring 0.8 cm in greatest short axis diameter (image 71, series 6) and index right infrahilar lymph node measuring 0.7 cm (image 88). No definitive bulky mediastinal, hilar or axillary lymphadenopathy. Lungs/Pleura: Interval development of small bilateral pleural effusions with worsening bibasilar consolidative opacities and associated air bronchograms, right greater than left. Interval development of an approximately 1.0 x 0.5 cm subpleural consolidative opacity within the right upper lobe (image 36, series 7). Unchanged punctate (approximately 3 mm) calcified granuloma within the left lower lobe (image 75, series 3). Evaluation for additional pulmonary nodules is degraded secondary to bibasilar airspace opacities. The central pulmonary airways appear widely patent. Upper abdomen: Fluid distention of the mid and distal aspects of the esophagus with marked distension of the imaged superior aspect of the stomach. Musculoskeletal: No acute or aggressive osseous abnormalities. There is incomplete fusion involving the spinous processes of multiple thoracic vertebral bodies, unchanged. Regional soft tissues appear normal.  Normal appearance of the thyroid gland. IMPRESSION: 1. No evidence of thoracic aortic aneurysm or dissection. 2. Small bilateral effusions with associated bibasilar consolidative opacities with associated air bronchograms, atelectasis versus infiltrate. Follow-up chest radiograph in 4-6 weeks after treatment is recommended to ensure resolution. 3. Fluid distension of the mid and distal aspects of the esophagus as well as marked distension of the imaged superior aspect stomach - correlation for gastric outlet obstructive symptoms is advised. 4. Cardiomegaly with enlargement of the caliber the  main pulmonary artery as could be seen in the setting of pulmonary arterial hypertension. Electronically Signed   By: Sandi Mariscal M.D.   On: 09/06/2018 09:20   Mr Abdomen W Wo Contrast  Result Date: 09/12/2018 CLINICAL DATA:  Suspicion of pancreatic neck mass on CT. EXAM: MRI ABDOMEN WITHOUT AND WITH CONTRAST TECHNIQUE: Multiplanar multisequence MR imaging of the abdomen was performed both before and after the administration of intravenous contrast. CONTRAST:  6 cc Gadavist COMPARISON:  CT of 1 day prior. Stone study of 08/22/2018. More remote CT of 12/19/2017. FINDINGS: Moderatemultifactorial degradation, including respiratory motion and susceptibility artifact from embolization coils. Lower chest: Bilateral pleural fluid with bibasilar airspace disease. Normal heart size. Hepatobiliary: Left hepatic lobe 9 mm cyst. Too small to characterizea right hepatic lobe lesion is most likely a cyst at 2 mm. Normal gallbladder, without biliary ductal dilatation. Pancreas: An area of soft tissue fullness and restricted diffusion is identified within the pancreatic neck, corresponding to the abnormality on CT. This measures on the order of 3.9 x 3.6 cm on image 76/12. Also 3.6 x 3.6 cm on image 35/31. Results in pancreatic duct dilatation within the upstream body and tail on image 17/11. Involves the celiac and its branches including  on image 75/12. Spleen:  Grossly within normal limits. Adrenals/Urinary Tract: Normal adrenal glands. Grossly normal kidneys, without hydronephrosis. Stomach/Bowel: Gastric distension with proximal wall and fold thickening, including on image 73/12. Grossly normal abdominal bowel loops. Vascular/Lymphatic: Normal caliber of the aorta. 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 Limited evaluation for abdominal adenopathy. Other:  Small volume abdominal ascites. Musculoskeletal: No acute osseous abnormality. IMPRESSION: 1. Moderate motion and susceptibility artifact degradation throughout. 2. Confirmation of a pancreatic neck mass, most consistent with adenocarcinoma. Involvement of the celiac and its branches, most consistent with a non-operative lesion. 3. Consider endoscopic ultrasound sampling. 4. Gastric wall and fold thickening, suggesting gastritis. 5. Small bilateral pleural effusions with adjacent airspace disease. Electronically Signed   By: Abigail Miyamoto M.D.   On: 09/12/2018 03:04   Ir Angiogram Visceral Selective  Result Date: 09/07/2018 INDICATION: 58 year old male with large volume upper GI bleed secondary to a very large duodenal ulcer. Patient underwent endoscopic intervention but continues to require pressors and large volume transfusion. He presents for mesenteric arteriography and embolization of any active bleeding and prophylactic embolization of the gastroduodenal artery. EXAM: IR ULTRASOUND GUIDANCE VASC ACCESS RIGHT; ADDITIONAL ARTERIOGRAPHY; IR EMBO ART VEN HEMORR LYMPH EXTRAV INC GUIDE ROADMAPPING; SELECTIVE VISCERAL ARTERIOGRAPHY 1. Ultrasound-guided vascular access 2. Catheterization of the left gastric artery where it arises directly from the aorta with arteriogram 3. Catheterization of the celiac artery with arteriogram 4. Catheterization of the common hepatic artery with arteriogram 5. Catheterization of the gastroduodenal artery with arteriogram 6. Coil embolization of  the gastroduodenal artery 7. Catheterization of the superior mesenteric artery with arteriogram MEDICATIONS: None ANESTHESIA/SEDATION: Patient intubated and on propofol drip CONTRAST:  70 mL Isovue 370 FLUOROSCOPY TIME:  Fluoroscopy Time: 11 minutes 30 seconds (487 mGy). COMPLICATIONS: None immediate. PROCEDURE: Informed consent was obtained from the patient following explanation of the procedure, risks, benefits and alternatives. The patient understands, agrees and consents for the procedure. All questions were addressed. A time out was performed prior to the initiation of the procedure. Maximal barrier sterile technique utilized including caps, mask, sterile gowns, sterile gloves, large sterile drape, hand hygiene, and Betadine prep. The right common femoral artery was interrogated with ultrasound and found to be widely patent. An image was obtained  and stored for the medical record. Local anesthesia was attained by infiltration with 1% lidocaine. A small dermatotomy was made. Under real-time sonographic guidance, the vessel was punctured with a 21 gauge micropuncture needle. Using standard technique, the initial micro needle was exchanged over a 0.018 micro wire for a transitional 4 [place] micro sheath. The micro sheath was then exchanged over a 0.035 wire for a 5 French vascular sheath. A C2 cobra catheter was advanced in the abdominal aorta over a Bentson wire. The catheter was used to select the first vessel arising from the ventral aorta. Contrast was injected. This is a left gastric artery arising directly from the aorta. Anatomy is normal. No evidence of active bleeding. The Cobra catheter was then used to select the origin of the celiac artery. Arteriography was performed. The splenic artery is small in caliber. The hepatic artery demonstrates normal hepatic arterial anatomy. The gastroduodenal artery is slightly irregular with spasm in the proximal segment. No evidence of active extravasation. A glidewire  was successfully advanced into the common hepatic artery and the C2 cobra catheter was advanced into the common hepatic artery. Additional arteriography was performed in multiple obliquities. Again, the proximal gastroduodenal artery is irregular and spasmodic. No evidence of dissection, aneurysm or active bleeding. A Lantern microcatheter was then advanced over a Fathom 16 wire and used to select the distal aspect of the gastroduodenal artery. Arteriography was performed. The gastroepiploic arteries are widely patent. No evidence of active bleeding. Coil embolization was then performed using a 4 mm Ruby POD detachable micro coil followed by a 45 cm Ruby packing coil. Post embolization arteriography demonstrates complete occlusion of the vessel with stasis in the proximal segment. The Lantern microcatheter was brought back into the common hepatic artery and arteriography was performed. Again, complete occlusion of the gastroduodenal artery. The right gastric artery is visualized. No evidence of active bleeding from the right gastric artery. The microcatheter was removed. The C2 cobra catheter was used to select the superior mesenteric artery. Arteriography was performed. Patent pancreaticoduodenal arcade supplying the gastroepiploic artery. No evidence of active hemorrhage. The C2 cobra catheter was removed. A limited right common femoral arteriogram was performed confirming common femoral arterial access. Hemostasis was attained with the assistance of a Cordis ExoSeal extra arterial vascular plug. IMPRESSION: 1. No evidence of active hemorrhage. 2. 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 3. Prophylactic coil embolization of the gastroduodenal artery. Signed, Criselda Peaches, MD, [place] [facility] Radiology Electronically Signed   By: Jacqulynn Cadet M.D.   On: 09/07/2018 22:32   Ir Angiogram Visceral Selective  Result Date:  09/07/2018 INDICATION: 58 year old male with large volume upper GI bleed secondary to a very large duodenal ulcer. Patient underwent endoscopic intervention but continues to require pressors and large volume transfusion. He presents for mesenteric arteriography and embolization of any active bleeding and prophylactic embolization of the gastroduodenal artery. EXAM: IR ULTRASOUND GUIDANCE VASC ACCESS RIGHT; ADDITIONAL ARTERIOGRAPHY; IR EMBO ART VEN HEMORR LYMPH EXTRAV INC GUIDE ROADMAPPING; SELECTIVE VISCERAL ARTERIOGRAPHY 1. Ultrasound-guided vascular access 2. Catheterization of the left gastric artery where it arises directly from the aorta with arteriogram 3. Catheterization of the celiac artery with arteriogram 4. Catheterization of the common hepatic artery with arteriogram 5. Catheterization of the gastroduodenal artery with arteriogram 6. Coil embolization of the gastroduodenal artery 7. Catheterization of the superior mesenteric artery with arteriogram MEDICATIONS: None ANESTHESIA/SEDATION: Patient intubated and on propofol drip CONTRAST:  70 mL Isovue 370 FLUOROSCOPY TIME:  Fluoroscopy Time: 11 minutes 30 seconds (487 mGy). COMPLICATIONS: None immediate. PROCEDURE: Informed consent was obtained from the patient following explanation of the procedure, risks, benefits and alternatives. The patient understands, agrees and consents for the procedure. All questions were addressed. A time out was performed prior to the initiation of the procedure. Maximal barrier sterile technique utilized including caps, mask, sterile gowns, sterile gloves, large sterile drape, hand hygiene, and Betadine prep. The right common femoral artery was interrogated with ultrasound and found to be widely patent. An image was obtained and stored for the medical record. Local anesthesia was attained by infiltration with 1% lidocaine. A small dermatotomy was made. Under real-time sonographic guidance, the vessel was punctured with a 21  gauge micropuncture needle. Using standard technique, the initial micro needle was exchanged over a 0.018 micro wire for a transitional 4 [place] micro sheath. The micro sheath was then exchanged over a 0.035 wire for a 5 French vascular sheath. A C2 cobra catheter was advanced in the abdominal aorta over a Bentson wire. The catheter was used to select the first vessel arising from the ventral aorta. Contrast was injected. This is a left gastric artery arising directly from the aorta. Anatomy is normal. No evidence of active bleeding. The Cobra catheter was then used to select the origin of the celiac artery. Arteriography was performed. The splenic artery is small in caliber. The hepatic artery demonstrates normal hepatic arterial anatomy. The gastroduodenal artery is slightly irregular with spasm in the proximal segment. No evidence of active extravasation. A glidewire was successfully advanced into the common hepatic artery and the C2 cobra catheter was advanced into the common hepatic artery. Additional arteriography was performed in multiple obliquities. Again, the proximal gastroduodenal artery is irregular and spasmodic. No evidence of dissection, aneurysm or active bleeding. A Lantern microcatheter was then advanced over a Fathom 16 wire and used to select the distal aspect of the gastroduodenal artery. Arteriography was performed. The gastroepiploic arteries are widely patent. No evidence of active bleeding. Coil embolization was then performed using a 4 mm Ruby POD detachable micro coil followed by a 45 cm Ruby packing coil. Post embolization arteriography demonstrates complete occlusion of the vessel with stasis in the proximal segment. The Lantern microcatheter was brought back into the common hepatic artery and arteriography was performed. Again, complete occlusion of the gastroduodenal artery. The right gastric artery is visualized. No evidence of active bleeding from the right gastric artery. The  microcatheter was removed. The C2 cobra catheter was used to select the superior mesenteric artery. Arteriography was performed. Patent pancreaticoduodenal arcade supplying the gastroepiploic artery. No evidence of active hemorrhage. The C2 cobra catheter was removed. A limited right common femoral arteriogram was performed confirming common femoral arterial access. Hemostasis was attained with the assistance of a Cordis ExoSeal extra arterial vascular plug. IMPRESSION: 1. No evidence of active hemorrhage. 2. 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 3. Prophylactic coil embolization of the gastroduodenal artery. Signed, Criselda Peaches, MD, [place] [facility] Radiology Electronically Signed   By: Jacqulynn Cadet M.D.   On: 09/07/2018 22:32   Ir Angiogram Selective Each Additional Vessel  Result Date: 09/07/2018 INDICATION: 58 year old male with large volume upper GI bleed secondary to a very large duodenal ulcer. Patient underwent endoscopic intervention but continues to require pressors and large volume transfusion. He presents for mesenteric arteriography and embolization of any active bleeding and prophylactic embolization of the gastroduodenal artery. EXAM: IR ULTRASOUND GUIDANCE VASC ACCESS  RIGHT; ADDITIONAL ARTERIOGRAPHY; IR EMBO ART VEN HEMORR LYMPH EXTRAV INC GUIDE ROADMAPPING; SELECTIVE VISCERAL ARTERIOGRAPHY 1. Ultrasound-guided vascular access 2. Catheterization of the left gastric artery where it arises directly from the aorta with arteriogram 3. Catheterization of the celiac artery with arteriogram 4. Catheterization of the common hepatic artery with arteriogram 5. Catheterization of the gastroduodenal artery with arteriogram 6. Coil embolization of the gastroduodenal artery 7. Catheterization of the superior mesenteric artery with arteriogram MEDICATIONS: None ANESTHESIA/SEDATION: Patient intubated and on propofol drip  CONTRAST:  70 mL Isovue 370 FLUOROSCOPY TIME:  Fluoroscopy Time: 11 minutes 30 seconds (487 mGy). COMPLICATIONS: None immediate. PROCEDURE: Informed consent was obtained from the patient following explanation of the procedure, risks, benefits and alternatives. The patient understands, agrees and consents for the procedure. All questions were addressed. A time out was performed prior to the initiation of the procedure. Maximal barrier sterile technique utilized including caps, mask, sterile gowns, sterile gloves, large sterile drape, hand hygiene, and Betadine prep. The right common femoral artery was interrogated with ultrasound and found to be widely patent. An image was obtained and stored for the medical record. Local anesthesia was attained by infiltration with 1% lidocaine. A small dermatotomy was made. Under real-time sonographic guidance, the vessel was punctured with a 21 gauge micropuncture needle. Using standard technique, the initial micro needle was exchanged over a 0.018 micro wire for a transitional 4 [place] micro sheath. The micro sheath was then exchanged over a 0.035 wire for a 5 French vascular sheath. A C2 cobra catheter was advanced in the abdominal aorta over a Bentson wire. The catheter was used to select the first vessel arising from the ventral aorta. Contrast was injected. This is a left gastric artery arising directly from the aorta. Anatomy is normal. No evidence of active bleeding. The Cobra catheter was then used to select the origin of the celiac artery. Arteriography was performed. The splenic artery is small in caliber. The hepatic artery demonstrates normal hepatic arterial anatomy. The gastroduodenal artery is slightly irregular with spasm in the proximal segment. No evidence of active extravasation. A glidewire was successfully advanced into the common hepatic artery and the C2 cobra catheter was advanced into the common hepatic artery. Additional arteriography was performed in  multiple obliquities. Again, the proximal gastroduodenal artery is irregular and spasmodic. No evidence of dissection, aneurysm or active bleeding. A Lantern microcatheter was then advanced over a Fathom 16 wire and used to select the distal aspect of the gastroduodenal artery. Arteriography was performed. The gastroepiploic arteries are widely patent. No evidence of active bleeding. Coil embolization was then performed using a 4 mm Ruby POD detachable micro coil followed by a 45 cm Ruby packing coil. Post embolization arteriography demonstrates complete occlusion of the vessel with stasis in the proximal segment. The Lantern microcatheter was brought back into the common hepatic artery and arteriography was performed. Again, complete occlusion of the gastroduodenal artery. The right gastric artery is visualized. No evidence of active bleeding from the right gastric artery. The microcatheter was removed. The C2 cobra catheter was used to select the superior mesenteric artery. Arteriography was performed. Patent pancreaticoduodenal arcade supplying the gastroepiploic artery. No evidence of active hemorrhage. The C2 cobra catheter was removed. A limited right common femoral arteriogram was performed confirming common femoral arterial access. Hemostasis was attained with the assistance of a Cordis ExoSeal extra arterial vascular plug. IMPRESSION: 1. No evidence of active hemorrhage. 2. 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 3. Prophylactic coil embolization of  the gastroduodenal artery. Signed, Criselda Peaches, MD, [place] [facility] Radiology Electronically Signed   By: Jacqulynn Cadet M.D.   On: 09/07/2018 22:32   Ir Angiogram Selective Each Additional Vessel  Result Date: 09/07/2018 INDICATION: 58 year old male with large volume upper GI bleed secondary to a very large duodenal ulcer. Patient underwent endoscopic intervention but  continues to require pressors and large volume transfusion. He presents for mesenteric arteriography and embolization of any active bleeding and prophylactic embolization of the gastroduodenal artery. EXAM: IR ULTRASOUND GUIDANCE VASC ACCESS RIGHT; ADDITIONAL ARTERIOGRAPHY; IR EMBO ART VEN HEMORR LYMPH EXTRAV INC GUIDE ROADMAPPING; SELECTIVE VISCERAL ARTERIOGRAPHY 1. Ultrasound-guided vascular access 2. Catheterization of the left gastric artery where it arises directly from the aorta with arteriogram 3. Catheterization of the celiac artery with arteriogram 4. Catheterization of the common hepatic artery with arteriogram 5. Catheterization of the gastroduodenal artery with arteriogram 6. Coil embolization of the gastroduodenal artery 7. Catheterization of the superior mesenteric artery with arteriogram MEDICATIONS: None ANESTHESIA/SEDATION: Patient intubated and on propofol drip CONTRAST:  70 mL Isovue 370 FLUOROSCOPY TIME:  Fluoroscopy Time: 11 minutes 30 seconds (487 mGy). COMPLICATIONS: None immediate. PROCEDURE: Informed consent was obtained from the patient following explanation of the procedure, risks, benefits and alternatives. The patient understands, agrees and consents for the procedure. All questions were addressed. A time out was performed prior to the initiation of the procedure. Maximal barrier sterile technique utilized including caps, mask, sterile gowns, sterile gloves, large sterile drape, hand hygiene, and Betadine prep. The right common femoral artery was interrogated with ultrasound and found to be widely patent. An image was obtained and stored for the medical record. Local anesthesia was attained by infiltration with 1% lidocaine. A small dermatotomy was made. Under real-time sonographic guidance, the vessel was punctured with a 21 gauge micropuncture needle. Using standard technique, the initial micro needle was exchanged over a 0.018 micro wire for a transitional 4 [place] micro sheath. The  micro sheath was then exchanged over a 0.035 wire for a 5 French vascular sheath. A C2 cobra catheter was advanced in the abdominal aorta over a Bentson wire. The catheter was used to select the first vessel arising from the ventral aorta. Contrast was injected. This is a left gastric artery arising directly from the aorta. Anatomy is normal. No evidence of active bleeding. The Cobra catheter was then used to select the origin of the celiac artery. Arteriography was performed. The splenic artery is small in caliber. The hepatic artery demonstrates normal hepatic arterial anatomy. The gastroduodenal artery is slightly irregular with spasm in the proximal segment. No evidence of active extravasation. A glidewire was successfully advanced into the common hepatic artery and the C2 cobra catheter was advanced into the common hepatic artery. Additional arteriography was performed in multiple obliquities. Again, the proximal gastroduodenal artery is irregular and spasmodic. No evidence of dissection, aneurysm or active bleeding. A Lantern microcatheter was then advanced over a Fathom 16 wire and used to select the distal aspect of the gastroduodenal artery. Arteriography was performed. The gastroepiploic arteries are widely patent. No evidence of active bleeding. Coil embolization was then performed using a 4 mm Ruby POD detachable micro coil followed by a 45 cm Ruby packing coil. Post embolization arteriography demonstrates complete occlusion of the vessel with stasis in the proximal segment. The Lantern microcatheter was brought back into the common hepatic artery and arteriography was performed. Again, complete occlusion of the gastroduodenal artery. The right gastric artery is  visualized. No evidence of active bleeding from the right gastric artery. The microcatheter was removed. The C2 cobra catheter was used to select the superior mesenteric artery. Arteriography was performed. Patent pancreaticoduodenal arcade  supplying the gastroepiploic artery. No evidence of active hemorrhage. The C2 cobra catheter was removed. A limited right common femoral arteriogram was performed confirming common femoral arterial access. Hemostasis was attained with the assistance of a Cordis ExoSeal extra arterial vascular plug. IMPRESSION: 1. No evidence of active hemorrhage. 2. 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 3. Prophylactic coil embolization of the gastroduodenal artery. Signed, Criselda Peaches, MD, [place] [facility] Radiology Electronically Signed   By: Jacqulynn Cadet M.D.   On: 09/07/2018 22:32   Ir US Guide Vasc Access Right  Result Date: 09/07/2018 INDICATION: 58 year old male with large volume upper GI bleed secondary to a very large duodenal ulcer. Patient underwent endoscopic intervention but continues to require pressors and large volume transfusion. He presents for mesenteric arteriography and embolization of any active bleeding and prophylactic embolization of the gastroduodenal artery. EXAM: IR ULTRASOUND GUIDANCE VASC ACCESS RIGHT; ADDITIONAL ARTERIOGRAPHY; IR EMBO ART VEN HEMORR LYMPH EXTRAV INC GUIDE ROADMAPPING; SELECTIVE VISCERAL ARTERIOGRAPHY 1. Ultrasound-guided vascular access 2. Catheterization of the left gastric artery where it arises directly from the aorta with arteriogram 3. Catheterization of the celiac artery with arteriogram 4. Catheterization of the common hepatic artery with arteriogram 5. Catheterization of the gastroduodenal artery with arteriogram 6. Coil embolization of the gastroduodenal artery 7. Catheterization of the superior mesenteric artery with arteriogram MEDICATIONS: None ANESTHESIA/SEDATION: Patient intubated and on propofol drip CONTRAST:  70 mL Isovue 370 FLUOROSCOPY TIME:  Fluoroscopy Time: 11 minutes 30 seconds (487 mGy). COMPLICATIONS: None immediate. PROCEDURE: Informed consent was obtained from the  patient following explanation of the procedure, risks, benefits and alternatives. The patient understands, agrees and consents for the procedure. All questions were addressed. A time out was performed prior to the initiation of the procedure. Maximal barrier sterile technique utilized including caps, mask, sterile gowns, sterile gloves, large sterile drape, hand hygiene, and Betadine prep. The right common femoral artery was interrogated with ultrasound and found to be widely patent. An image was obtained and stored for the medical record. Local anesthesia was attained by infiltration with 1% lidocaine. A small dermatotomy was made. Under real-time sonographic guidance, the vessel was punctured with a 21 gauge micropuncture needle. Using standard technique, the initial micro needle was exchanged over a 0.018 micro wire for a transitional 4 [place] micro sheath. The micro sheath was then exchanged over a 0.035 wire for a 5 French vascular sheath. A C2 cobra catheter was advanced in the abdominal aorta over a Bentson wire. The catheter was used to select the first vessel arising from the ventral aorta. Contrast was injected. This is a left gastric artery arising directly from the aorta. Anatomy is normal. No evidence of active bleeding. The Cobra catheter was then used to select the origin of the celiac artery. Arteriography was performed. The splenic artery is small in caliber. The hepatic artery demonstrates normal hepatic arterial anatomy. The gastroduodenal artery is slightly irregular with spasm in the proximal segment. No evidence of active extravasation. A glidewire was successfully advanced into the common hepatic artery and the C2 cobra catheter was advanced into the common hepatic artery. Additional arteriography was performed in multiple obliquities. Again, the proximal gastroduodenal artery is irregular and spasmodic. No evidence of dissection, aneurysm or active bleeding. A Lantern microcatheter was then  advanced over a Fathom 16 wire and used to select the distal aspect of the gastroduodenal artery. Arteriography was performed. The gastroepiploic arteries are widely patent. No evidence of active bleeding. Coil embolization was then performed using a 4 mm Ruby POD detachable micro coil followed by a 45 cm Ruby packing coil. Post embolization arteriography demonstrates complete occlusion of the vessel with stasis in the proximal segment. The Lantern microcatheter was brought back into the common hepatic artery and arteriography was performed. Again, complete occlusion of the gastroduodenal artery. The right gastric artery is visualized. No evidence of active bleeding from the right gastric artery. The microcatheter was removed. The C2 cobra catheter was used to select the superior mesenteric artery. Arteriography was performed. Patent pancreaticoduodenal arcade supplying the gastroepiploic artery. No evidence of active hemorrhage. The C2 cobra catheter was removed. A limited right common femoral arteriogram was performed confirming common femoral arterial access. Hemostasis was attained with the assistance of a Cordis ExoSeal extra arterial vascular plug. IMPRESSION: 1. No evidence of active hemorrhage. 2. 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 3. Prophylactic coil embolization of the gastroduodenal artery. Signed, Criselda Peaches, MD, [place] [facility] Radiology Electronically Signed   By: Jacqulynn Cadet M.D.   On: 09/07/2018 22:32   Dg Chest Port 1 View  Result Date: 09/09/2018 CLINICAL DATA:  Respiratory failure EXAM: PORTABLE CHEST 1 VIEW COMPARISON:  09/07/2018 FINDINGS: Endotracheal tube and nasogastric catheter have been removed in the interval. Left jugular central line is again noted in the distal superior vena cava. The cardiac shadow is stable. The lungs are well aerated bilaterally with patchy bibasilar infiltrates and  small left pleural effusion. IMPRESSION: Stable bibasilar infiltrates and small left pleural effusion. Electronically Signed   By: Inez Catalina M.D.   On: 09/09/2018 07:17   Dg Chest Port 1 View  Result Date: 09/07/2018 CLINICAL DATA:  Status post fasciotomy and hematoma back UA shins in the right lower leg 2 days ago. Intubated patient. EXAM: PORTABLE CHEST 1 VIEW COMPARISON:  CT scan of the chest of September 06, 2018 FINDINGS: The lungs are well-expanded. There are patchy increased densities at both bases which are slightly more conspicuous today. There is a small right and and somewhat larger left pleural effusion. The heart and pulmonary vascularity are normal. The endotracheal tube tip projects 4.1 cm above the carina. The esophagogastric tube tip in proximal port project below the GE junction. The left internal jugular venous catheter tip projects over the midportion of the SVC. IMPRESSION: Bibasilar atelectasis or pneumonia. Small right pleural effusion and somewhat larger left pleural effusion. The support tubes are in reasonable position. Electronically Signed   By: David  Martinique M.D.   On: 09/07/2018 13:27   Vas [place] Burnard Bunting With/wo Tbi  Result Date: 09/15/2018 LOWER EXTREMITY DOPPLER STUDY Indications: Peripheral artery disease.  Vascular Interventions: Status post right popliteal - tibial embolectomy and                         fasciotomy. Comparison Study: No exam for comparison Performing Technologist: Birdena Crandall, [place] [facility]  Examination Guidelines: A complete evaluation includes at minimum, Doppler waveform signals and systolic blood pressure reading at the level of bilateral brachial, anterior tibial, and posterior tibial arteries, when vessel segments are accessible. Bilateral testing is considered an integral part of a complete examination. Photoelectric Plethysmograph (PPG) waveforms and toe systolic pressure readings are included as required and additional duplex testing as  needed. Limited  examinations for reoccurring indications may be performed as noted.  ABI Findings: +--------+------------------+-----+----------+--------+ Right   Rt Pressure (mmHg)IndexWaveform  Comment  +--------+------------------+-----+----------+--------+ [id number]                    triphasic          +--------+------------------+-----+----------+--------+ PTA     179               1.36 triphasic          +--------+------------------+-----+----------+--------+ DP      152               1.15 monophasic         +--------+------------------+-----+----------+--------+ +--------+------------------+-----+---------+-------+ Left    Lt Pressure (mmHg)IndexWaveform Comment +--------+------------------+-----+---------+-------+ [id number]                    triphasic        +--------+------------------+-----+---------+-------+ PTA     176               1.33 triphasic        +--------+------------------+-----+---------+-------+ DP      162               1.23 triphasic        +--------+------------------+-----+---------+-------+ +-------+-----------+-----------+------------+------------+ ABI/TBIToday's ABIToday's TBIPrevious ABIPrevious TBI +-------+-----------+-----------+------------+------------+ Right  1.36                                           +-------+-----------+-----------+------------+------------+ Left   1.33                                           +-------+-----------+-----------+------------+------------+  Summary: Right: Resting right ankle-brachial index indicates noncompressible right lower extremity arteries. Left: Resting left ankle-brachial index indicates noncompressible left lower extremity arteries.  *See table(s) above for measurements and observations.  Electronically signed by Harold Barban MD on 09/15/2018 at 1:50:47 PM.   Final    Ir Embo Art  [place] Guide Roadmapping  Result Date: 09/07/2018 INDICATION:  58 year old male with large volume upper GI bleed secondary to a very large duodenal ulcer. Patient underwent endoscopic intervention but continues to require pressors and large volume transfusion. He presents for mesenteric arteriography and embolization of any active bleeding and prophylactic embolization of the gastroduodenal artery. EXAM: IR ULTRASOUND GUIDANCE VASC ACCESS RIGHT; ADDITIONAL ARTERIOGRAPHY; IR EMBO ART VEN HEMORR LYMPH EXTRAV INC GUIDE ROADMAPPING; SELECTIVE VISCERAL ARTERIOGRAPHY 1. Ultrasound-guided vascular access 2. Catheterization of the left gastric artery where it arises directly from the aorta with arteriogram 3. Catheterization of the celiac artery with arteriogram 4. Catheterization of the common hepatic artery with arteriogram 5. Catheterization of the gastroduodenal artery with arteriogram 6. Coil embolization of the gastroduodenal artery 7. Catheterization of the superior mesenteric artery with arteriogram MEDICATIONS: None ANESTHESIA/SEDATION: Patient intubated and on propofol drip CONTRAST:  70 mL Isovue 370 FLUOROSCOPY TIME:  Fluoroscopy Time: 11 minutes 30 seconds (487 mGy). COMPLICATIONS: None immediate. PROCEDURE: Informed consent was obtained from the patient following explanation of the procedure, risks, benefits and alternatives. The patient understands, agrees and consents for the procedure. All questions were addressed. A time out was performed prior to the initiation of the procedure. Maximal barrier sterile technique utilized including caps, mask, sterile gowns, sterile  gloves, large sterile drape, hand hygiene, and Betadine prep. The right common femoral artery was interrogated with ultrasound and found to be widely patent. An image was obtained and stored for the medical record. Local anesthesia was attained by infiltration with 1% lidocaine. A small dermatotomy was made. Under real-time sonographic guidance, the vessel was punctured with a 21 gauge micropuncture  needle. Using standard technique, the initial micro needle was exchanged over a 0.018 micro wire for a transitional 4 [place] micro sheath. The micro sheath was then exchanged over a 0.035 wire for a 5 French vascular sheath. A C2 cobra catheter was advanced in the abdominal aorta over a Bentson wire. The catheter was used to select the first vessel arising from the ventral aorta. Contrast was injected. This is a left gastric artery arising directly from the aorta. Anatomy is normal. No evidence of active bleeding. The Cobra catheter was then used to select the origin of the celiac artery. Arteriography was performed. The splenic artery is small in caliber. The hepatic artery demonstrates normal hepatic arterial anatomy. The gastroduodenal artery is slightly irregular with spasm in the proximal segment. No evidence of active extravasation. A glidewire was successfully advanced into the common hepatic artery and the C2 cobra catheter was advanced into the common hepatic artery. Additional arteriography was performed in multiple obliquities. Again, the proximal gastroduodenal artery is irregular and spasmodic. No evidence of dissection, aneurysm or active bleeding. A Lantern microcatheter was then advanced over a Fathom 16 wire and used to select the distal aspect of the gastroduodenal artery. Arteriography was performed. The gastroepiploic arteries are widely patent. No evidence of active bleeding. Coil embolization was then performed using a 4 mm Ruby POD detachable micro coil followed by a 45 cm Ruby packing coil. Post embolization arteriography demonstrates complete occlusion of the vessel with stasis in the proximal segment. The Lantern microcatheter was brought back into the common hepatic artery and arteriography was performed. Again, complete occlusion of the gastroduodenal artery. The right gastric artery is visualized. No evidence of active bleeding from the right gastric artery. The microcatheter was  removed. The C2 cobra catheter was used to select the superior mesenteric artery. Arteriography was performed. Patent pancreaticoduodenal arcade supplying the gastroepiploic artery. No evidence of active hemorrhage. The C2 cobra catheter was removed. A limited right common femoral arteriogram was performed confirming common femoral arterial access. Hemostasis was attained with the assistance of a Cordis ExoSeal extra arterial vascular plug. IMPRESSION: 1. No evidence of active hemorrhage. 2. 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 3. Prophylactic coil embolization of the gastroduodenal artery. Signed, Criselda Peaches, MD, [facility] Electronically Signed   By: Jacqulynn Cadet M.D.   On: 09/07/2018 22:32   Ct Angio Abd/pel W/ And/or W/o  Result Date: 09/11/2018 CLINICAL DATA:  Status postoperative right popliteal and tibial embolectomy for embolic disease. Known bilateral iliac artery aneurysmal disease. EXAM: CT ANGIOGRAPHY ABDOMEN AND PELVIS WITH CONTRAST TECHNIQUE: Multidetector CT imaging of the abdomen and pelvis was performed using the standard protocol during bolus administration of intravenous contrast. Multiplanar reconstructed images and MIPs were obtained and reviewed to evaluate the vascular anatomy. CONTRAST:  188m ISOVUE-370 IOPAMIDOL (ISOVUE-370) INJECTION 76% COMPARISON:  CTA of the abdomen on 01/30/2018 and CT of the abdomen and pelvis with contrast on 12/19/2017. FINDINGS: VASCULAR Aorta: Stable mild dilatation of the infrarenal aorta with maximal diameter of 3.1 cm. Celiac: Mild origin stenosis of 25-30%. Distal branches are patent. Status post  transcatheter coil embolization of the gastroduodenal artery on 09/07/2018. Hepatic arteries are normally patent. SMA: Normally patent. Renals: Bilateral single renal arteries demonstrate normal patency. IMA: Normally patent. Inflow: Fusiform aneurysmal  disease present of bilateral common iliac arteries as previously demonstrated. Maximal caliber of the right common iliac artery is 2.8-2.9 cm and maximal caliber of the right common iliac artery is 2.7 cm. These measurements are stable since prior studies. The significant change since the prior studies is a much more prominent and new component of mural thrombus in both common iliac arteries, right greater than left. In the proximal right common iliac artery, mural thrombus narrows the aneurysmal lumen by at least 50% in its narrowest portion. Eccentric mural thrombus is present at the origin of the right common iliac artery and also distally just prior to the common iliac bifurcation without significant luminal stenosis. Aneurysmal disease again noted of bilateral internal iliac arteries with the left measuring approximately 2.5 cm in greatest diameter and the right measuring 1.5 cm. Both internal iliac arteries also demonstrate significant mural thrombus without occlusion. Mural thrombus is stable to slightly more prominent compared to the 12/19/2017 study. Bilateral external iliac arteries are normally patent and demonstrate diffuse arteriomegaly with maximum caliber 1.5 cm. Proximal Outflow: Bilateral common femoral arteries demonstrate dilatation with the left measuring up to 2.1 cm in diameter. Mural thrombus is present along the posterior lumen with approximately 50% reduction in luminal diameter. No thrombus is seen extending into proximal SFA or profunda femoral arteries. On the right side the common femoral artery measures up to 1.8 cm in the femoral bifurcation appears widely patent. Veins: Delayed venous phase imaging shows patent venous structures without evidence of thrombus. There is some probable mass effect of ectatic external iliac arteries on the distal aspects of bilateral external iliac veins without evidence of associated deep venous thrombosis or significant collateral vein formation. Review  of the MIP images confirms the above findings. NON-VASCULAR Lower chest: Small bilateral pleural effusions with bibasilar atelectasis. Hepatobiliary: No focal liver abnormality is seen. No gallstones, gallbladder wall thickening, or biliary dilatation. Pancreas: Since the March study, there is a change in appearance of the pancreas on the venous phase of imaging. The tail of the pancreas now appears progressively atrophic with associated pancreatic ductal dilatation up to approximately 7 mm. At the junction of the body and head of the pancreas, there is suggestion a potential ill-defined low-attenuation mass that could measure as much as 3 cm in AP diameter. Borders are very difficult to delineate by CT. There is no associated biliary obstruction. 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 Recommend eventual correlation with MRI of the abdomen with and without gadolinium. There may be a window of time recommended to wait to perform MRI after placement of the gastroduodenal artery Ruby coils. This can be further investigated based on manufacturer recommendation. If there may be a significant delay in ability to perform MRI, consider endoscopic ultrasound to investigate the pancreas. Spleen: Normal in size without focal abnormality. Adrenals/Urinary Tract: Adrenal glands are unremarkable. Kidneys are normal, without renal calculi, focal lesion, or hydronephrosis. Bladder is unremarkable. Stomach/Bowel: Gastric fold thickening and prominent fold thickening of the duodenum may be consistent with known peptic ulcer disease. No evidence of bowel obstruction or free intraperitoneal air. No focal abscess identified. Lymphatic: No enlarged lymph nodes identified. Reproductive: Prostate is unremarkable. Other: Diffuse anasarca of the body wall and edema of the mesenteric and retroperitoneal fat likely relate to low albumin and malnutrition. Musculoskeletal: Moderate  degenerative disc disease at L5-S1. IMPRESSION:  VASCULAR 1. 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 No complications evident after embolization. 2. Stable mild aneurysmal disease of the distal abdominal aorta measuring 3.1 cm. 3. 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 The major change since prior imaging is significant increase in mural thrombus within the dilated common iliac arteries bilaterally, especially on the right. This is most likely the source distal emboli to the right distal leg. Mural thrombus in the internal iliac artery aneurysms is stable to slightly more prominent. 4. Stable diffuse enlargement bilateral external iliac and common femoral arteries. The left common femoral artery does contain significant posterior wall mural thrombus. NON-VASCULAR 1. The most significant nonvascular finding is potential development of a mass of the pancreas at the juncture of the head and body causing progressive atrophy of the tail of the pancreas with associated pancreatic ductal dilatation. On the venous phase of imaging, delineation of the potential mass is quite vague but the region of mass may measure as much as 3 cm. There may be some issue with immediate performance of MRI in the setting recent coiling of the gastroduodenal artery even if the coils are MRI compatible. This can be checked with the manufacturer recommendation. If MRI can be performed immediately, an MRI of the abdomen with and without gadolinium would be helpful for further evaluation. If there is going to be significant delay, consider EUS characterization of the pancreas. 2. Diffuse body wall anasarca and edema of the mesenteric fat and retroperitoneum likely reflects low albumin and malnutrition. 3. Prominent gastric folds and duodenal folds without evidence of bowel obstruction or perforation. Electronically Signed   By: Aletta Edouard M.D.   On: 09/11/2018 12:36     LOS: 19 days   Signature  Lala Lund M.D on 09/22/2018 at 10:27 AM   -  To page go to [email or web address] - password [email or web address]

## 2018-09-23 ENCOUNTER — Ambulatory Visit
Admit: 2018-09-23 | Discharge: 2018-09-23 | Disposition: A | Discharge status: Home / Self Care | Payer: Self-pay | Attending: Radiation Oncology | Admitting: Radiation Oncology

## 2018-09-23 ENCOUNTER — Inpatient Hospital Stay (HOSPITAL_COMMUNITY): Payer: Medicaid Other

## 2018-09-23 DIAGNOSIS — C259 Malignant neoplasm of pancreas, unspecified: Secondary | ICD-10-CM

## 2018-09-23 LAB — BASIC METABOLIC PANEL
Anion gap: 12 (ref 5–15)
BUN: 33 mg/dL — ABNORMAL HIGH (ref 6–20)
CO2: 21 mmol/L — AB (ref 22–32)
Calcium: 8.2 mg/dL — ABNORMAL LOW (ref 8.9–10.3)
Chloride: 100 mmol/L (ref 98–111)
Creatinine, Ser: 1.86 mg/dL — ABNORMAL HIGH (ref 0.61–1.24)
GFR calc Af Amer: 46 mL/min — ABNORMAL LOW (ref >60)
GFR calc non Af Amer: 39 mL/min — ABNORMAL LOW (ref >60)
Glucose, Bld: 251 mg/dL — ABNORMAL HIGH (ref 70–99)
Potassium: 4.5 mmol/L (ref 3.5–5.1)
Sodium: 133 mmol/L — ABNORMAL LOW (ref 135–145)

## 2018-09-23 LAB — URIC ACID: Uric Acid, Serum: 1.8 mg/dL — ABNORMAL LOW (ref 3.7–8.6)

## 2018-09-23 LAB — GLUCOSE, CAPILLARY
GLUCOSE-CAPILLARY: 276 mg/dL — AB (ref 70–99)
Glucose-Capillary: 294 mg/dL — ABNORMAL HIGH (ref 70–99)
Glucose-Capillary: 164 mg/dL — ABNORMAL HIGH (ref 70–99)
Glucose-Capillary: 231 mg/dL — ABNORMAL HIGH (ref 70–99)

## 2018-09-23 LAB — CREATININE, URINE, RANDOM: CREATININE, URINE: 35.33 mg/dL

## 2018-09-23 LAB — OSMOLALITY: Osmolality: 290 mOsm/kg (ref 275–295)

## 2018-09-23 LAB — MAGNESIUM: Magnesium: 1.6 mg/dL — ABNORMAL LOW (ref 1.7–2.4)

## 2018-09-23 LAB — SODIUM, URINE, RANDOM: Sodium, Ur: 169 mmol/L

## 2018-09-23 LAB — OSMOLALITY, URINE: Osmolality, Ur: 736 mOsm/kg (ref 300–900)

## 2018-09-23 MED ORDER — TAMSULOSIN HCL 0.4 MG PO CAPS
0.4000 mg | ORAL_CAPSULE | Freq: Every day | ORAL | Status: DC
  Administered 2018-09-23 – 2018-10-15 (×16): 0.4 mg via ORAL
  Filled 2018-09-23 (×23): qty 1

## 2018-09-23 MED ORDER — LACTATED RINGERS IV SOLN
INTRAVENOUS | Status: AC
  Administered 2018-09-23 – 2018-09-24 (×4): via INTRAVENOUS

## 2018-09-23 MED ORDER — SODIUM CHLORIDE 0.9 % IV SOLN
510.0000 mg | Freq: Once | INTRAVENOUS | Status: AC
  Administered 2018-09-23: 510 mg via INTRAVENOUS
  Filled 2018-09-23: qty 17

## 2018-09-23 MED ORDER — ATORVASTATIN CALCIUM 20 MG PO TABS
20.0000 mg | ORAL_TABLET | Freq: Every day | ORAL | Status: DC
  Administered 2018-09-23 – 2018-11-03 (×39): 20 mg via ORAL
  Filled 2018-09-23: qty 2
  Filled 2018-09-23 (×16): qty 1
  Filled 2018-09-23 (×2): qty 2
  Filled 2018-09-23 (×10): qty 1
  Filled 2018-09-23 (×4): qty 2
  Filled 2018-09-23: qty 1
  Filled 2018-09-23: qty 2
  Filled 2018-09-23 (×5): qty 1

## 2018-09-23 NOTE — Progress Notes (Signed)
Radiation Oncology         [phone] ________________________________  Initial Inpatient Consultation  Name: Ryan Medina MRN: [id number]  Date: 09/23/2018  DOB: 02-20-1961  BU:LAGTXMI, No Pcp Per  Volanda Napoleon, MD   REFERRING PHYSICIAN: Volanda Napoleon, MD  DIAGNOSIS: The encounter diagnosis was Pancreatic adenocarcinoma [facility]).  HISTORY OF PRESENT ILLNESS::Ryan Medina is a 58 y.o. male who began having pain in his legs 2-3 weeks ago, R>L. He was found to have an acute popliteal and tibial embolus. He underwent embolectomies. He was then placed on IV heparin. Unfortunately he had GI bleeding and was found to have a duodenal ulcer. He also required fasciotomies for compartment syndrome.  Amongst all of this, he was found to have a mass in the pancreatic body. He had an MRI of the abdomen done on 09/12/18. This showed a 3.9 x 3.6 cm mass within the pancreatic neck. It was involving the celiac artery and branches. As such, it is felt to be non-resectable.  He was seen by GI for endoscopic ultrasound on 09/16/2018. Biopsies came back with the pathology report that showed adenocarcinoma consistent with pancreatic cancer.  He has been recommended to receive concurrent chemoradiation and has been referred today to discuss radiation treatment options.   PREVIOUS RADIATION THERAPY: No  PAST MEDICAL HISTORY:  has a past medical history of Back pain, chronic, Diabetes ([place] ), Dilated cardiomyopathy ([place]), Goals of care, counseling/discussion (09/24/2018), HLD (hyperlipidemia), Hypertension, IDDM (insulin dependent diabetes mellitus) ([place]), and Ruptured lumbar disc.    PAST SURGICAL HISTORY: Past Surgical History:  Procedure Laterality Date  . ABDOMINAL AORTOGRAM W/LOWER EXTREMITY Right 09/03/2018   Procedure: ABDOMINAL AORTOGRAM W/LOWER EXTREMITY;  Surgeon: Angelia Mould, MD;  Location: [place] [facility];  Service: Cardiovascular;  Laterality: Right;  . ABDOMINAL SURGERY      . APPENDECTOMY    . APPLICATION OF WOUND VAC Right 09/05/2018   Procedure: APPLICATION OF WOUND VAC RIGHT LOWER MEDIAL AND LATERAL FASCIOTOMY SITE;  Surgeon: Angelia Mould, MD;  Location: [place];  Service: Vascular;  Laterality: Right;  . BACK SURGERY    . BIOPSY  09/16/2018   Procedure: BIOPSY;  Surgeon: Milus Banister, MD;  Location: [place];  Service: Endoscopy;;  . CARDIAC CATHETERIZATION N/A 05/19/2016   Procedure: Left Heart Cath and Coronary Angiography;  Surgeon: Leonie Man, MD;  Location: [place] [facility];  Service: Cardiovascular;  Laterality: N/A;  . EMBOLECTOMY Right 09/03/2018   Procedure: Embolectomy Right Popliteal and Tibial Artery, Bovine Pericardium Patch Angioplasty Right Popliteal Artery;  Surgeon: Angelia Mould, MD;  Location: [place];  Service: Vascular;  Laterality: Right;  . EMBOLECTOMY Right 09/10/2018   Procedure: EMBOLECTOMY RIGHT LOWER EXTREMITY;  Surgeon: Angelia Mould, MD;  Location: [facility] OR;  Service: Vascular;  Laterality: Right;  . ESOPHAGOGASTRODUODENOSCOPY N/A 09/07/2018   Procedure: ESOPHAGOGASTRODUODENOSCOPY (EGD);  Surgeon: Irving Copas., MD;  Location: [place];  Service: Gastroenterology;  Laterality: N/A;  . ESOPHAGOGASTRODUODENOSCOPY (EGD) WITH PROPOFOL N/A 09/16/2018   Procedure: ESOPHAGOGASTRODUODENOSCOPY (EGD) WITH PROPOFOL;  Surgeon: Milus Banister, MD;  Location: [facility] ENDOSCOPY;  Service: Endoscopy;  Laterality: N/A;  . EUS N/A 09/16/2018   Procedure: ESOPHAGEAL ENDOSCOPIC ULTRASOUND (EUS) RADIAL;  Surgeon: Milus Banister, MD;  Location: [facility] ENDOSCOPY;  Service: Endoscopy;  Laterality: N/A;  . FASCIOTOMY Right 09/05/2018   Procedure: FOUR COMPARTMENT FASCIOTOMY RIGHT LOWER LEG ;  Surgeon: Angelia Mould, MD;  Location: [place];  Service: Vascular;  Laterality: Right;  . FASCIOTOMY CLOSURE Right 09/10/2018   Procedure: FASCIOTOMY CLOSURE FOUR COMPARTMENT;  Surgeon: Angelia Mould, MD;   Location: [place];  Service: Vascular;  Laterality: Right;  . FINE NEEDLE ASPIRATION  09/16/2018   Procedure: FINE NEEDLE ASPIRATION (FNA) LINEAR;  Surgeon: Milus Banister, MD;  Location: [facility] ENDOSCOPY;  Service: Endoscopy;;  . HEMATOMA EVACUATION Right 09/05/2018   Procedure: EVACUATION HEMATOMA OF RIGHT LOWER EXTREMITY; EVACUATION OF LYMPHOCELE RIGHT LOWER LEG ;  Surgeon: Angelia Mould, MD;  Location: [place];  Service: Vascular;  Laterality: Right;  . IR ANGIOGRAM SELECTIVE EACH ADDITIONAL VESSEL  09/07/2018  . IR ANGIOGRAM SELECTIVE EACH ADDITIONAL VESSEL  09/07/2018  . IR ANGIOGRAM VISCERAL SELECTIVE  09/07/2018  . IR ANGIOGRAM VISCERAL SELECTIVE  09/07/2018  . IR EMBO ART  VEN HEMORR LYMPH EXTRAV  INC GUIDE ROADMAPPING  09/07/2018  . IR US GUIDE VASC ACCESS RIGHT  09/07/2018  . TEE WITHOUT CARDIOVERSION N/A 09/16/2018   Procedure: TRANSESOPHAGEAL ECHOCARDIOGRAM (TEE);  Surgeon: Milus Banister, MD;  Location: [facility]COPY;  Service: Endoscopy;  Laterality: N/A;  if cleared by GI    FAMILY HISTORY: family history includes Diabetes in his brother, mother, and sister; Heart failure in his mother.  SOCIAL HISTORY:  reports that he has been smoking cigarettes. He has a 6.25 pack-year smoking history. He has never used smokeless tobacco. He reports that he does not drink alcohol or use drugs. works in [employer].  ALLERGIES: Lisinopril  MEDICATIONS:  No current facility-administered medications for this encounter.    No current outpatient medications on file.   Facility-Administered Medications Ordered in Other Encounters  Medication Dose Route Frequency Provider Last Rate Last Dose  . alum & mag hydroxide-simeth (MAALOX/MYLANTA) 200-200-20 MG/5ML suspension 30 mL  30 mL Oral BID PRN Josue Hector, MD   30 mL at 09/25/18 0834  . atorvastatin (LIPITOR) tablet 20 mg  20 mg Oral QHS Thurnell Lose, MD   20 mg at 09/25/18 2246  . [START ON 09/27/2018] ceFAZolin (ANCEF)  IVPB 2g/100 mL premix  2 g Intravenous to Edward Jolly, PA-C      . chlorhexidine gluconate (MEDLINE KIT) (PERIDEX) 0.12 % solution 15 mL  15 mL Mouth Rinse BID Josue Hector, MD   15 mL at 09/20/18 2350  . dronabinol (MARINOL) capsule 5 mg  5 mg Oral BID AC Ennever, Rudell Cobb, MD   5 mg at 09/26/18 1737  . furosemide (LASIX) injection 20 mg  20 mg Intravenous Once Emokpae, Courage, MD      . insulin aspart (novoLOG) injection 0-5 Units  0-5 Units Subcutaneous QHS Thurnell Lose, MD   3 Units at 09/23/18 2244  . insulin aspart (novoLOG) injection 0-9 Units  0-9 Units Subcutaneous TID WC Thurnell Lose, MD   1 Units at 09/26/18 1737  . insulin glargine (LANTUS) injection 6 Units  6 Units Subcutaneous QHS Emokpae, Courage, MD      . ipratropium-albuterol (DUONEB) 0.5-2.5 (3) MG/3ML nebulizer solution 3 mL  3 mL Nebulization Q4H PRN Josue Hector, MD   3 mL at 09/26/18 1856  . lidocaine (XYLOCAINE) 2 % viscous mouth solution 15 mL  15 mL Oral Once Josue Hector, MD      . LORazepam (ATIVAN) tablet 0.5-1 mg  0.5-1 mg Oral Q6H PRN Josue Hector, MD   0.5 mg at 09/25/18 2246  . MEDLINE mouth rinse  15 mL Mouth Rinse BID Nishan,  Wallis Bamberg, MD   15 mL at 09/25/18 0834  . morphine 2 MG/ML injection 1 mg  1 mg Intravenous Q4H PRN Josue Hector, MD   1 mg at 09/24/18 1026  . octreotide (SANDOSTATIN) 500 mcg in sodium chloride 0.9 % 250 mL (2 mcg/mL) infusion  50 mcg/hr Intravenous Continuous Emokpae, Courage, MD 25 mL/hr at 09/26/18 1000 50 mcg/hr at 09/26/18 1000  . ondansetron (ZOFRAN) injection 4 mg  4 mg Intravenous Q6H PRN Josue Hector, MD   4 mg at 09/25/18 1602  . oxyCODONE (Oxy IR/ROXICODONE) immediate release tablet 10 mg  10 mg Oral Q4H PRN Volanda Napoleon, MD   10 mg at 09/26/18 1212  . pantoprazole (PROTONIX) 80 mg in sodium chloride 0.9 % 250 mL (0.32 mg/mL) infusion  8 mg/hr Intravenous Continuous Emokpae, Courage, MD      . Derrill Memo ON 09/27/2018] pantoprazole (PROTONIX) EC  tablet 40 mg  40 mg Oral BID Emokpae, Courage, MD      . polyethylene glycol (MIRALAX / GLYCOLAX) packet 17 g  17 g Oral Daily PRN Josue Hector, MD   17 g at 09/25/18 1602  . senna-docusate (Senokot-S) tablet 1 tablet  1 tablet Oral QHS PRN Josue Hector, MD      . sodium chloride flush (NS) 0.9 % injection 10-40 mL  10-40 mL Intracatheter PRN Josue Hector, MD   10 mL at 09/22/18 2030  . tamsulosin (FLOMAX) capsule 0.4 mg  0.4 mg Oral QHS Lala Lund K, MD   0.4 mg at 09/25/18 2246  . zolpidem (AMBIEN) tablet 5 mg  5 mg Oral QHS PRN Thurnell Lose, MD   5 mg at 09/22/18 2205    REVIEW OF SYSTEMS: REVIEW OF SYSTEMS: A 10+ POINT REVIEW OF SYSTEMS WAS OBTAINED including neurology, dermatology, psychiatry, cardiac, respiratory, lymph, extremities, GI, GU, musculoskeletal, constitutional, reproductive, HEENT. All pertinent positives are noted in the HPI. All others are negative. Generalized fatigue and significant weight loss.   PHYSICAL EXAM: General: Alert and oriented, in no acute distress,lying in hospital bed, girlfriend present, emaciated appearance HEENT: Head is normocephalic. Extraocular movements are intact. Oropharynx is clear. Neck: Neck is supple, no palpable cervical or supraclavicular lymphadenopathy. Heart: Regular in rate and rhythm with no murmurs, rubs, or gallops. Chest: Clear to auscultation bilaterally, with no rhonchi, wheezes, or rales. Abdomen: Soft, nontender, nondistended, with no rigidity or guarding. Mild distension Extremities: No cyanosis or edema. Lymphatics: see Neck Exam Skin: No concerning lesions. Musculoskeletal: symmetric strength and muscle tone throughout. Neurologic: Cranial nerves II through XII are grossly intact. No obvious focalities. Speech is fluent. Coordination is intact. Psychiatric: Judgment and insight are intact. Affect is appropriate.      ECOG = 2  0 - Asymptomatic (Fully active, able to carry on all predisease activities  without restriction)  1 - Symptomatic but completely ambulatory (Restricted in physically strenuous activity but ambulatory and able to carry out work of a light or sedentary nature. For example, light housework, office work)  2 - Symptomatic, <50% in bed during the day (Ambulatory and capable of all self care but unable to carry out any work activities. Up and about more than 50% of waking hours)  3 - Symptomatic, >50% in bed, but not bedbound (Capable of only limited self-care, confined to bed or chair 50% or more of waking hours)  4 - Bedbound (Completely disabled. Cannot carry on any self-care. Totally confined to bed or chair)  5 -  Death   Eustace Pen MM, Creech RH, Tormey DC, et al. [phone]). "Toxicity and response criteria of the [facility]". [place] Oncol. 5 (6): 649-55  LABORATORY DATA:  Lab Results  Component Value Date   WBC 21.8 (H) 09/26/2018   HGB 8.0 (L) 09/26/2018   HCT 24.9 (L) 09/26/2018   MCV 88.0 09/26/2018   PLT 331 09/26/2018   NEUTROABS 11.5 (H) 09/09/2018   Lab Results  Component Value Date   NA 134 (L) 09/26/2018   K 4.0 09/26/2018   CL 100 09/26/2018   CO2 25 09/26/2018   GLUCOSE 50 (L) 09/26/2018   CREATININE 0.67 09/26/2018   CALCIUM 8.2 (L) 09/26/2018      RADIOGRAPHY: Dg Chest 2 View  Result Date: 09/18/2018 CLINICAL DATA:  58 year old former smoker who had a fever and shortness of breath yesterday. Breathing has improved significantly after nebulizer treatment. Follow-up pneumonia. EXAM: CHEST - 2 VIEW COMPARISON:  09/09/2018 and earlier, including CT chest 09/06/2018. FINDINGS: Cardiac silhouette normal in size, unchanged. Thoracic aorta mildly tortuous, unchanged. Hilar and mediastinal contours otherwise unremarkable. Mild emphysematous changes in both lungs as noted on the recent CT. Linear atelectasis or developing scar in the LEFT LOWER LOBE at the site of the recent pneumonia. Lungs otherwise clear. No localized airspace  consolidation. No pleural effusions. No pneumothorax. Normal pulmonary vascularity. Visualized bony thorax intact. IMPRESSION: COPD/emphysema. Linear atelectasis or developing scar in the LEFT LOWER LOBE at the site of the recent pneumonia. No acute cardiopulmonary disease otherwise. Electronically Signed   By: Evangeline Dakin M.D.   On: 09/18/2018 12:41   Dg Abd 1 View  Result Date: 09/07/2018 CLINICAL DATA:  OG tube placement. EXAM: ABDOMEN - 1 VIEW COMPARISON:  CT abdomen pelvis 08/22/2018. FINDINGS: OG tube is in place with the side port and tip in the body of the stomach. The stomach is distended. IMPRESSION: OG tube in good position. Electronically Signed   By: Inge Rise M.D.   On: 09/07/2018 13:31   Ct Angio Chest Pe W Or Wo Contrast  Result Date: 09/06/2018 CLINICAL DATA:  Evaluate thoracic aortic aneurysm. EXAM: CT ANGIOGRAPHY CHEST WITH CONTRAST TECHNIQUE: Multidetector CT imaging of the chest was performed using the standard protocol during bolus administration of intravenous contrast. Multiplanar CT image reconstructions and MIPs were obtained to evaluate the vascular anatomy. CONTRAST:  65 mL ISOVUE-370 IOPAMIDOL (ISOVUE-370) INJECTION 76% COMPARISON:  01/30/2018; 10/14/2016; 04/25/2026 FINDINGS: Vascular Findings: No evidence of thoracic aortic aneurysm with measurements as follows. No definite evidence of thoracic aortic dissection or periaortic stranding on this nongated examination. Bovine configuration of the aortic arch. The branch vessels of the aortic arch appear widely patent throughout their imaged courses. The descending thoracic aorta is of normal caliber and widely patent without hemodynamically significant stenosis. There is a very minimal amount of atherosclerotic plaque involving the proximal aspect of the descending thoracic aorta. Cardiomegaly.  No pericardial effusion. Although this examination was not tailored for the evaluation the pulmonary arteries, there are no  discrete filling defects within the central pulmonary arterial tree to suggest central pulmonary embolism. Borderline enlarged caliber the main pulmonary artery measuring 32 mm in diameter (image 67, series 6) ------------------------------------------------------------- Thoracic aortic measurements: Sinotubular junction 33 mm as measured in greatest oblique coronal dimension. Proximal ascending aorta 35 mm as measured in greatest oblique axial dimension at the level of the main pulmonary artery (image 70, series 6); and approximately 36 mm in greatest oblique short axis coronal diameter (coronal  image 48, series 9). Aortic arch aorta 28 mm as measured in greatest oblique sagittal dimension. Proximal descending thoracic aorta 30 mm as measured in greatest oblique axial dimension at the level of the main pulmonary artery. Distal descending thoracic aorta 29 mm as measured in greatest oblique axial dimension at the level of the diaphragmatic hiatus. Review of the MIP images confirms the above findings. ------------------------------------------------------------- Non-Vascular Findings: Mediastinum/Lymph Nodes: Prominent right hilar lymph nodes are not enlarged by size criteria with index right suprahilar lymph node measuring 0.8 cm in greatest short axis diameter (image 71, series 6) and index right infrahilar lymph node measuring 0.7 cm (image 88). No definitive bulky mediastinal, hilar or axillary lymphadenopathy. Lungs/Pleura: Interval development of small bilateral pleural effusions with worsening bibasilar consolidative opacities and associated air bronchograms, right greater than left. Interval development of an approximately 1.0 x 0.5 cm subpleural consolidative opacity within the right upper lobe (image 36, series 7). Unchanged punctate (approximately 3 mm) calcified granuloma within the left lower lobe (image 75, series 3). Evaluation for additional pulmonary nodules is degraded secondary to bibasilar  airspace opacities. The central pulmonary airways appear widely patent. Upper abdomen: Fluid distention of the mid and distal aspects of the esophagus with marked distension of the imaged superior aspect of the stomach. Musculoskeletal: No acute or aggressive osseous abnormalities. There is incomplete fusion involving the spinous processes of multiple thoracic vertebral bodies, unchanged. Regional soft tissues appear normal. Normal appearance of the thyroid gland. IMPRESSION: 1. No evidence of thoracic aortic aneurysm or dissection. 2. Small bilateral effusions with associated bibasilar consolidative opacities with associated air bronchograms, atelectasis versus infiltrate. Follow-up chest radiograph in 4-6 weeks after treatment is recommended to ensure resolution. 3. Fluid distension of the mid and distal aspects of the esophagus as well as marked distension of the imaged superior aspect stomach - correlation for gastric outlet obstructive symptoms is advised. 4. Cardiomegaly with enlargement of the caliber the main pulmonary artery as could be seen in the setting of pulmonary arterial hypertension. Electronically Signed   By: Sandi Mariscal M.D.   On: 09/06/2018 09:20   Mr Abdomen W Wo Contrast  Result Date: 09/12/2018 CLINICAL DATA:  Suspicion of pancreatic neck mass on CT. EXAM: MRI ABDOMEN WITHOUT AND WITH CONTRAST TECHNIQUE: Multiplanar multisequence MR imaging of the abdomen was performed both before and after the administration of intravenous contrast. CONTRAST:  6 cc Gadavist COMPARISON:  CT of 1 day prior. Stone study of 08/22/2018. More remote CT of 12/19/2017. FINDINGS: Moderatemultifactorial degradation, including respiratory motion and susceptibility artifact from embolization coils. Lower chest: Bilateral pleural fluid with bibasilar airspace disease. Normal heart size. Hepatobiliary: Left hepatic lobe 9 mm cyst. Too small to characterizea right hepatic lobe lesion is most likely a cyst at 2 mm.  Normal gallbladder, without biliary ductal dilatation. Pancreas: An area of soft tissue fullness and restricted diffusion is identified within the pancreatic neck, corresponding to the abnormality on CT. This measures on the order of 3.9 x 3.6 cm on image 76/12. Also 3.6 x 3.6 cm on image 35/31. Results in pancreatic duct dilatation within the upstream body and tail on image 17/11. Involves the celiac and its branches including on image 75/12. Spleen:  Grossly within normal limits. Adrenals/Urinary Tract: Normal adrenal glands. Grossly normal kidneys, without hydronephrosis. Stomach/Bowel: Gastric distension with proximal wall and fold thickening, including on image 73/12. Grossly normal abdominal bowel loops. Vascular/Lymphatic: Normal caliber of the aorta. 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 Limited evaluation for abdominal  adenopathy. Other:  Small volume abdominal ascites. Musculoskeletal: No acute osseous abnormality. IMPRESSION: 1. Moderate motion and susceptibility artifact degradation throughout. 2. Confirmation of a pancreatic neck mass, most consistent with adenocarcinoma. Involvement of the celiac and its branches, most consistent with a non-operative lesion. 3. Consider endoscopic ultrasound sampling. 4. Gastric wall and fold thickening, suggesting gastritis. 5. Small bilateral pleural effusions with adjacent airspace disease. Electronically Signed   By: Abigail Miyamoto M.D.   On: 09/12/2018 03:04   Ir Angiogram Visceral Selective  Result Date: 09/07/2018 INDICATION: 58 year old male with large volume upper GI bleed secondary to a very large duodenal ulcer. Patient underwent endoscopic intervention but continues to require pressors and large volume transfusion. He presents for mesenteric arteriography and embolization of any active bleeding and prophylactic embolization of the gastroduodenal artery. EXAM: IR ULTRASOUND GUIDANCE VASC ACCESS RIGHT; ADDITIONAL ARTERIOGRAPHY; IR EMBO ART  VEN HEMORR LYMPH EXTRAV INC GUIDE ROADMAPPING; SELECTIVE VISCERAL ARTERIOGRAPHY 1. Ultrasound-guided vascular access 2. Catheterization of the left gastric artery where it arises directly from the aorta with arteriogram 3. Catheterization of the celiac artery with arteriogram 4. Catheterization of the common hepatic artery with arteriogram 5. Catheterization of the gastroduodenal artery with arteriogram 6. Coil embolization of the gastroduodenal artery 7. Catheterization of the superior mesenteric artery with arteriogram MEDICATIONS: None ANESTHESIA/SEDATION: Patient intubated and on propofol drip CONTRAST:  70 mL Isovue 370 FLUOROSCOPY TIME:  Fluoroscopy Time: 11 minutes 30 seconds (487 mGy). COMPLICATIONS: None immediate. PROCEDURE: Informed consent was obtained from the patient following explanation of the procedure, risks, benefits and alternatives. The patient understands, agrees and consents for the procedure. All questions were addressed. A time out was performed prior to the initiation of the procedure. Maximal barrier sterile technique utilized including caps, mask, sterile gowns, sterile gloves, large sterile drape, hand hygiene, and Betadine prep. The right common femoral artery was interrogated with ultrasound and found to be widely patent. An image was obtained and stored for the medical record. Local anesthesia was attained by infiltration with 1% lidocaine. A small dermatotomy was made. Under real-time sonographic guidance, the vessel was punctured with a 21 gauge micropuncture needle. Using standard technique, the initial micro needle was exchanged over a 0.018 micro wire for a transitional 4 [place] micro sheath. The micro sheath was then exchanged over a 0.035 wire for a 5 French vascular sheath. A C2 cobra catheter was advanced in the abdominal aorta over a Bentson wire. The catheter was used to select the first vessel arising from the ventral aorta. Contrast was injected. This is a left gastric  artery arising directly from the aorta. Anatomy is normal. No evidence of active bleeding. The Cobra catheter was then used to select the origin of the celiac artery. Arteriography was performed. The splenic artery is small in caliber. The hepatic artery demonstrates normal hepatic arterial anatomy. The gastroduodenal artery is slightly irregular with spasm in the proximal segment. No evidence of active extravasation. A glidewire was successfully advanced into the common hepatic artery and the C2 cobra catheter was advanced into the common hepatic artery. Additional arteriography was performed in multiple obliquities. Again, the proximal gastroduodenal artery is irregular and spasmodic. No evidence of dissection, aneurysm or active bleeding. A Lantern microcatheter was then advanced over a Fathom 16 wire and used to select the distal aspect of the gastroduodenal artery. Arteriography was performed. The gastroepiploic arteries are widely patent. No evidence of active bleeding. Coil embolization was then performed using a 4 mm Ruby POD detachable micro coil followed  by a 45 cm Ruby packing coil. Post embolization arteriography demonstrates complete occlusion of the vessel with stasis in the proximal segment. The Lantern microcatheter was brought back into the common hepatic artery and arteriography was performed. Again, complete occlusion of the gastroduodenal artery. The right gastric artery is visualized. No evidence of active bleeding from the right gastric artery. The microcatheter was removed. The C2 cobra catheter was used to select the superior mesenteric artery. Arteriography was performed. Patent pancreaticoduodenal arcade supplying the gastroepiploic artery. No evidence of active hemorrhage. The C2 cobra catheter was removed. A limited right common femoral arteriogram was performed confirming common femoral arterial access. Hemostasis was attained with the assistance of a Cordis ExoSeal extra arterial  vascular plug. IMPRESSION: 1. No evidence of active hemorrhage. 2. 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 3. Prophylactic coil embolization of the gastroduodenal artery. Signed, Criselda Peaches, MD, [facility] Electronically Signed   By: Jacqulynn Cadet M.D.   On: 09/07/2018 22:32   Ir Angiogram Visceral Selective  Result Date: 09/07/2018 INDICATION: 58 year old male with large volume upper GI bleed secondary to a very large duodenal ulcer. Patient underwent endoscopic intervention but continues to require pressors and large volume transfusion. He presents for mesenteric arteriography and embolization of any active bleeding and prophylactic embolization of the gastroduodenal artery. EXAM: IR ULTRASOUND GUIDANCE VASC ACCESS RIGHT; ADDITIONAL ARTERIOGRAPHY; IR EMBO ART VEN HEMORR LYMPH EXTRAV INC GUIDE ROADMAPPING; SELECTIVE VISCERAL ARTERIOGRAPHY 1. Ultrasound-guided vascular access 2. Catheterization of the left gastric artery where it arises directly from the aorta with arteriogram 3. Catheterization of the celiac artery with arteriogram 4. Catheterization of the common hepatic artery with arteriogram 5. Catheterization of the gastroduodenal artery with arteriogram 6. Coil embolization of the gastroduodenal artery 7. Catheterization of the superior mesenteric artery with arteriogram MEDICATIONS: None ANESTHESIA/SEDATION: Patient intubated and on propofol drip CONTRAST:  70 mL Isovue 370 FLUOROSCOPY TIME:  Fluoroscopy Time: 11 minutes 30 seconds (487 mGy). COMPLICATIONS: None immediate. PROCEDURE: Informed consent was obtained from the patient following explanation of the procedure, risks, benefits and alternatives. The patient understands, agrees and consents for the procedure. All questions were addressed. A time out was performed prior to the initiation of the procedure. Maximal barrier sterile technique utilized  including caps, mask, sterile gowns, sterile gloves, large sterile drape, hand hygiene, and Betadine prep. The right common femoral artery was interrogated with ultrasound and found to be widely patent. An image was obtained and stored for the medical record. Local anesthesia was attained by infiltration with 1% lidocaine. A small dermatotomy was made. Under real-time sonographic guidance, the vessel was punctured with a 21 gauge micropuncture needle. Using standard technique, the initial micro needle was exchanged over a 0.018 micro wire for a transitional 4 [place] micro sheath. The micro sheath was then exchanged over a 0.035 wire for a 5 French vascular sheath. A C2 cobra catheter was advanced in the abdominal aorta over a Bentson wire. The catheter was used to select the first vessel arising from the ventral aorta. Contrast was injected. This is a left gastric artery arising directly from the aorta. Anatomy is normal. No evidence of active bleeding. The Cobra catheter was then used to select the origin of the celiac artery. Arteriography was performed. The splenic artery is small in caliber. The hepatic artery demonstrates normal hepatic arterial anatomy. The gastroduodenal artery is slightly irregular with spasm in the proximal segment. No evidence of active extravasation. A glidewire was successfully  advanced into the common hepatic artery and the C2 cobra catheter was advanced into the common hepatic artery. Additional arteriography was performed in multiple obliquities. Again, the proximal gastroduodenal artery is irregular and spasmodic. No evidence of dissection, aneurysm or active bleeding. A Lantern microcatheter was then advanced over a Fathom 16 wire and used to select the distal aspect of the gastroduodenal artery. Arteriography was performed. The gastroepiploic arteries are widely patent. No evidence of active bleeding. Coil embolization was then performed using a 4 mm Ruby POD detachable micro coil  followed by a 45 cm Ruby packing coil. Post embolization arteriography demonstrates complete occlusion of the vessel with stasis in the proximal segment. The Lantern microcatheter was brought back into the common hepatic artery and arteriography was performed. Again, complete occlusion of the gastroduodenal artery. The right gastric artery is visualized. No evidence of active bleeding from the right gastric artery. The microcatheter was removed. The C2 cobra catheter was used to select the superior mesenteric artery. Arteriography was performed. Patent pancreaticoduodenal arcade supplying the gastroepiploic artery. No evidence of active hemorrhage. The C2 cobra catheter was removed. A limited right common femoral arteriogram was performed confirming common femoral arterial access. Hemostasis was attained with the assistance of a Cordis ExoSeal extra arterial vascular plug. IMPRESSION: 1. No evidence of active hemorrhage. 2. 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 3. Prophylactic coil embolization of the gastroduodenal artery. Signed, Criselda Peaches, MD, [place] [facility] Radiology Electronically Signed   By: Jacqulynn Cadet M.D.   On: 09/07/2018 22:32   Ir Angiogram Selective Each Additional Vessel  Result Date: 09/07/2018 INDICATION: 58 year old male with large volume upper GI bleed secondary to a very large duodenal ulcer. Patient underwent endoscopic intervention but continues to require pressors and large volume transfusion. He presents for mesenteric arteriography and embolization of any active bleeding and prophylactic embolization of the gastroduodenal artery. EXAM: IR ULTRASOUND GUIDANCE VASC ACCESS RIGHT; ADDITIONAL ARTERIOGRAPHY; IR EMBO ART VEN HEMORR LYMPH EXTRAV INC GUIDE ROADMAPPING; SELECTIVE VISCERAL ARTERIOGRAPHY 1. Ultrasound-guided vascular access 2. Catheterization of the left gastric artery where it arises  directly from the aorta with arteriogram 3. Catheterization of the celiac artery with arteriogram 4. Catheterization of the common hepatic artery with arteriogram 5. Catheterization of the gastroduodenal artery with arteriogram 6. Coil embolization of the gastroduodenal artery 7. Catheterization of the superior mesenteric artery with arteriogram MEDICATIONS: None ANESTHESIA/SEDATION: Patient intubated and on propofol drip CONTRAST:  70 mL Isovue 370 FLUOROSCOPY TIME:  Fluoroscopy Time: 11 minutes 30 seconds (487 mGy). COMPLICATIONS: None immediate. PROCEDURE: Informed consent was obtained from the patient following explanation of the procedure, risks, benefits and alternatives. The patient understands, agrees and consents for the procedure. All questions were addressed. A time out was performed prior to the initiation of the procedure. Maximal barrier sterile technique utilized including caps, mask, sterile gowns, sterile gloves, large sterile drape, hand hygiene, and Betadine prep. The right common femoral artery was interrogated with ultrasound and found to be widely patent. An image was obtained and stored for the medical record. Local anesthesia was attained by infiltration with 1% lidocaine. A small dermatotomy was made. Under real-time sonographic guidance, the vessel was punctured with a 21 gauge micropuncture needle. Using standard technique, the initial micro needle was exchanged over a 0.018 micro wire for a transitional 4 [place] micro sheath. The micro sheath was then exchanged over a 0.035 wire for a 5 French vascular sheath. A C2 cobra catheter was advanced in the  abdominal aorta over a Bentson wire. The catheter was used to select the first vessel arising from the ventral aorta. Contrast was injected. This is a left gastric artery arising directly from the aorta. Anatomy is normal. No evidence of active bleeding. The Cobra catheter was then used to select the origin of the celiac artery. Arteriography  was performed. The splenic artery is small in caliber. The hepatic artery demonstrates normal hepatic arterial anatomy. The gastroduodenal artery is slightly irregular with spasm in the proximal segment. No evidence of active extravasation. A glidewire was successfully advanced into the common hepatic artery and the C2 cobra catheter was advanced into the common hepatic artery. Additional arteriography was performed in multiple obliquities. Again, the proximal gastroduodenal artery is irregular and spasmodic. No evidence of dissection, aneurysm or active bleeding. A Lantern microcatheter was then advanced over a Fathom 16 wire and used to select the distal aspect of the gastroduodenal artery. Arteriography was performed. The gastroepiploic arteries are widely patent. No evidence of active bleeding. Coil embolization was then performed using a 4 mm Ruby POD detachable micro coil followed by a 45 cm Ruby packing coil. Post embolization arteriography demonstrates complete occlusion of the vessel with stasis in the proximal segment. The Lantern microcatheter was brought back into the common hepatic artery and arteriography was performed. Again, complete occlusion of the gastroduodenal artery. The right gastric artery is visualized. No evidence of active bleeding from the right gastric artery. The microcatheter was removed. The C2 cobra catheter was used to select the superior mesenteric artery. Arteriography was performed. Patent pancreaticoduodenal arcade supplying the gastroepiploic artery. No evidence of active hemorrhage. The C2 cobra catheter was removed. A limited right common femoral arteriogram was performed confirming common femoral arterial access. Hemostasis was attained with the assistance of a Cordis ExoSeal extra arterial vascular plug. IMPRESSION: 1. No evidence of active hemorrhage. 2. 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 3. Prophylactic coil embolization of the gastroduodenal  artery. Signed, Criselda Peaches, MD, [place] [facility] Radiology Electronically Signed   By: Jacqulynn Cadet M.D.   On: 09/07/2018 22:32   Ir Angiogram Selective Each Additional Vessel  Result Date: 09/07/2018 INDICATION: 58 year old male with large volume upper GI bleed secondary to a very large duodenal ulcer. Patient underwent endoscopic intervention but continues to require pressors and large volume transfusion. He presents for mesenteric arteriography and embolization of any active bleeding and prophylactic embolization of the gastroduodenal artery. EXAM: IR ULTRASOUND GUIDANCE VASC ACCESS RIGHT; ADDITIONAL ARTERIOGRAPHY; IR EMBO ART VEN HEMORR LYMPH EXTRAV INC GUIDE ROADMAPPING; SELECTIVE VISCERAL ARTERIOGRAPHY 1. Ultrasound-guided vascular access 2. Catheterization of the left gastric artery where it arises directly from the aorta with arteriogram 3. Catheterization of the celiac artery with arteriogram 4. Catheterization of the common hepatic artery with arteriogram 5. Catheterization of the gastroduodenal artery with arteriogram 6. Coil embolization of the gastroduodenal artery 7. Catheterization of the superior mesenteric artery with arteriogram MEDICATIONS: None ANESTHESIA/SEDATION: Patient intubated and on propofol drip CONTRAST:  70 mL Isovue 370 FLUOROSCOPY TIME:  Fluoroscopy Time: 11 minutes 30 seconds (487 mGy). COMPLICATIONS: None immediate. PROCEDURE: Informed consent was obtained from the patient following explanation of the procedure, risks, benefits and alternatives. The patient understands, agrees and consents for the procedure. All questions were addressed. A time out was performed prior to the initiation of the procedure. Maximal barrier sterile technique utilized including caps, mask, sterile gowns, sterile gloves, large sterile drape, hand hygiene, and Betadine prep. The right common  femoral artery was interrogated with ultrasound  and found to be widely patent. An image was obtained and stored for the medical record. Local anesthesia was attained by infiltration with 1% lidocaine. A small dermatotomy was made. Under real-time sonographic guidance, the vessel was punctured with a 21 gauge micropuncture needle. Using standard technique, the initial micro needle was exchanged over a 0.018 micro wire for a transitional 4 [place] micro sheath. The micro sheath was then exchanged over a 0.035 wire for a 5 French vascular sheath. A C2 cobra catheter was advanced in the abdominal aorta over a Bentson wire. The catheter was used to select the first vessel arising from the ventral aorta. Contrast was injected. This is a left gastric artery arising directly from the aorta. Anatomy is normal. No evidence of active bleeding. The Cobra catheter was then used to select the origin of the celiac artery. Arteriography was performed. The splenic artery is small in caliber. The hepatic artery demonstrates normal hepatic arterial anatomy. The gastroduodenal artery is slightly irregular with spasm in the proximal segment. No evidence of active extravasation. A glidewire was successfully advanced into the common hepatic artery and the C2 cobra catheter was advanced into the common hepatic artery. Additional arteriography was performed in multiple obliquities. Again, the proximal gastroduodenal artery is irregular and spasmodic. No evidence of dissection, aneurysm or active bleeding. A Lantern microcatheter was then advanced over a Fathom 16 wire and used to select the distal aspect of the gastroduodenal artery. Arteriography was performed. The gastroepiploic arteries are widely patent. No evidence of active bleeding. Coil embolization was then performed using a 4 mm Ruby POD detachable micro coil followed by a 45 cm Ruby packing coil. Post embolization arteriography demonstrates complete occlusion of the vessel with stasis in the proximal segment. The Lantern  microcatheter was brought back into the common hepatic artery and arteriography was performed. Again, complete occlusion of the gastroduodenal artery. The right gastric artery is visualized. No evidence of active bleeding from the right gastric artery. The microcatheter was removed. The C2 cobra catheter was used to select the superior mesenteric artery. Arteriography was performed. Patent pancreaticoduodenal arcade supplying the gastroepiploic artery. No evidence of active hemorrhage. The C2 cobra catheter was removed. A limited right common femoral arteriogram was performed confirming common femoral arterial access. Hemostasis was attained with the assistance of a Cordis ExoSeal extra arterial vascular plug. IMPRESSION: 1. No evidence of active hemorrhage. 2. 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 3. Prophylactic coil embolization of the gastroduodenal artery. Signed, Criselda Peaches, MD, [place] [facility] Radiology Electronically Signed   By: Jacqulynn Cadet M.D.   On: 09/07/2018 22:32   US Renal  Result Date: 09/23/2018 CLINICAL DATA:  Acute renal failure. EXAM: RENAL / URINARY TRACT ULTRASOUND COMPLETE COMPARISON:  CT angiogram abdomen and pelvis 09/11/2018. FINDINGS: Right Kidney: Renal measurements: 11.8 x 6.3 x 7.4 cm = volume: 289.4 mL. Cortical echogenicity is increased. No mass or hydronephrosis visualized. Left Kidney: Renal measurements: 11.7 x 5.7 x 5.8 cm = volume: 204.1 mL. Cortical echogenicity is increased. 0.7 cm in diameter cyst incidentally noted. No solid mass or hydronephrosis visualized. Bladder: Appears normal for degree of bladder distention. IMPRESSION: Negative for hydronephrosis or acute abnormality. Increased cortical echogenicity bilaterally is compatible with medical renal disease. Electronically Signed   By: Inge Rise M.D.   On: 09/23/2018 11:22   Ir US Guide Vasc Access Right  Result Date:  09/07/2018 INDICATION: 58 year old male with large  volume upper GI bleed secondary to a very large duodenal ulcer. Patient underwent endoscopic intervention but continues to require pressors and large volume transfusion. He presents for mesenteric arteriography and embolization of any active bleeding and prophylactic embolization of the gastroduodenal artery. EXAM: IR ULTRASOUND GUIDANCE VASC ACCESS RIGHT; ADDITIONAL ARTERIOGRAPHY; IR EMBO ART VEN HEMORR LYMPH EXTRAV INC GUIDE ROADMAPPING; SELECTIVE VISCERAL ARTERIOGRAPHY 1. Ultrasound-guided vascular access 2. Catheterization of the left gastric artery where it arises directly from the aorta with arteriogram 3. Catheterization of the celiac artery with arteriogram 4. Catheterization of the common hepatic artery with arteriogram 5. Catheterization of the gastroduodenal artery with arteriogram 6. Coil embolization of the gastroduodenal artery 7. Catheterization of the superior mesenteric artery with arteriogram MEDICATIONS: None ANESTHESIA/SEDATION: Patient intubated and on propofol drip CONTRAST:  70 mL Isovue 370 FLUOROSCOPY TIME:  Fluoroscopy Time: 11 minutes 30 seconds (487 mGy). COMPLICATIONS: None immediate. PROCEDURE: Informed consent was obtained from the patient following explanation of the procedure, risks, benefits and alternatives. The patient understands, agrees and consents for the procedure. All questions were addressed. A time out was performed prior to the initiation of the procedure. Maximal barrier sterile technique utilized including caps, mask, sterile gowns, sterile gloves, large sterile drape, hand hygiene, and Betadine prep. The right common femoral artery was interrogated with ultrasound and found to be widely patent. An image was obtained and stored for the medical record. Local anesthesia was attained by infiltration with 1% lidocaine. A small dermatotomy was made. Under real-time sonographic guidance, the vessel was punctured with a 21  gauge micropuncture needle. Using standard technique, the initial micro needle was exchanged over a 0.018 micro wire for a transitional 4 [place] micro sheath. The micro sheath was then exchanged over a 0.035 wire for a 5 French vascular sheath. A C2 cobra catheter was advanced in the abdominal aorta over a Bentson wire. The catheter was used to select the first vessel arising from the ventral aorta. Contrast was injected. This is a left gastric artery arising directly from the aorta. Anatomy is normal. No evidence of active bleeding. The Cobra catheter was then used to select the origin of the celiac artery. Arteriography was performed. The splenic artery is small in caliber. The hepatic artery demonstrates normal hepatic arterial anatomy. The gastroduodenal artery is slightly irregular with spasm in the proximal segment. No evidence of active extravasation. A glidewire was successfully advanced into the common hepatic artery and the C2 cobra catheter was advanced into the common hepatic artery. Additional arteriography was performed in multiple obliquities. Again, the proximal gastroduodenal artery is irregular and spasmodic. No evidence of dissection, aneurysm or active bleeding. A Lantern microcatheter was then advanced over a Fathom 16 wire and used to select the distal aspect of the gastroduodenal artery. Arteriography was performed. The gastroepiploic arteries are widely patent. No evidence of active bleeding. Coil embolization was then performed using a 4 mm Ruby POD detachable micro coil followed by a 45 cm Ruby packing coil. Post embolization arteriography demonstrates complete occlusion of the vessel with stasis in the proximal segment. The Lantern microcatheter was brought back into the common hepatic artery and arteriography was performed. Again, complete occlusion of the gastroduodenal artery. The right gastric artery is visualized. No evidence of active bleeding from the right gastric artery. The  microcatheter was removed. The C2 cobra catheter was used to select the superior mesenteric artery. Arteriography was performed. Patent pancreaticoduodenal arcade supplying the gastroepiploic artery. No evidence of active hemorrhage. The C2 cobra catheter was  removed. A limited right common femoral arteriogram was performed confirming common femoral arterial access. Hemostasis was attained with the assistance of a Cordis ExoSeal extra arterial vascular plug. IMPRESSION: 1. No evidence of active hemorrhage. 2. 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 3. Prophylactic coil embolization of the gastroduodenal artery. Signed, Criselda Peaches, MD, [place] [facility] Radiology Electronically Signed   By: Jacqulynn Cadet M.D.   On: 09/07/2018 22:32   Dg Chest Port 1 View  Result Date: 09/09/2018 CLINICAL DATA:  Respiratory failure EXAM: PORTABLE CHEST 1 VIEW COMPARISON:  09/07/2018 FINDINGS: Endotracheal tube and nasogastric catheter have been removed in the interval. Left jugular central line is again noted in the distal superior vena cava. The cardiac shadow is stable. The lungs are well aerated bilaterally with patchy bibasilar infiltrates and small left pleural effusion. IMPRESSION: Stable bibasilar infiltrates and small left pleural effusion. Electronically Signed   By: Inez Catalina M.D.   On: 09/09/2018 07:17   Dg Chest Port 1 View  Result Date: 09/07/2018 CLINICAL DATA:  Status post fasciotomy and hematoma back UA shins in the right lower leg 2 days ago. Intubated patient. EXAM: PORTABLE CHEST 1 VIEW COMPARISON:  CT scan of the chest of September 06, 2018 FINDINGS: The lungs are well-expanded. There are patchy increased densities at both bases which are slightly more conspicuous today. There is a small right and and somewhat larger left pleural effusion. The heart and pulmonary vascularity are normal. The endotracheal tube tip  projects 4.1 cm above the carina. The esophagogastric tube tip in proximal port project below the GE junction. The left internal jugular venous catheter tip projects over the midportion of the SVC. IMPRESSION: Bibasilar atelectasis or pneumonia. Small right pleural effusion and somewhat larger left pleural effusion. The support tubes are in reasonable position. Electronically Signed   By: David  Martinique M.D.   On: 09/07/2018 13:27   Vas [place] Burnard Bunting With/wo Tbi  Result Date: 09/15/2018 LOWER EXTREMITY DOPPLER STUDY Indications: Peripheral artery disease.  Vascular Interventions: Status post right popliteal - tibial embolectomy and                         fasciotomy. Comparison Study: No exam for comparison Performing Technologist: Birdena Crandall, [place] [facility]  Examination Guidelines: A complete evaluation includes at minimum, Doppler waveform signals and systolic blood pressure reading at the level of bilateral brachial, anterior tibial, and posterior tibial arteries, when vessel segments are accessible. Bilateral testing is considered an integral part of a complete examination. Photoelectric Plethysmograph (PPG) waveforms and toe systolic pressure readings are included as required and additional duplex testing as needed. Limited examinations for reoccurring indications may be performed as noted.  ABI Findings: +--------+------------------+-----+----------+--------+ Right   Rt Pressure (mmHg)IndexWaveform  Comment  +--------+------------------+-----+----------+--------+ [id number]                    triphasic          +--------+------------------+-----+----------+--------+ PTA     179               1.36 triphasic          +--------+------------------+-----+----------+--------+ DP      152               1.15 monophasic         +--------+------------------+-----+----------+--------+ +--------+------------------+-----+---------+-------+ Left    Lt Pressure (mmHg)IndexWaveform Comment  +--------+------------------+-----+---------+-------+ [id number]  triphasic        +--------+------------------+-----+---------+-------+ PTA     176               1.33 triphasic        +--------+------------------+-----+---------+-------+ DP      162               1.23 triphasic        +--------+------------------+-----+---------+-------+ +-------+-----------+-----------+------------+------------+ ABI/TBIToday's ABIToday's TBIPrevious ABIPrevious TBI +-------+-----------+-----------+------------+------------+ Right  1.36                                           +-------+-----------+-----------+------------+------------+ Left   1.33                                           +-------+-----------+-----------+------------+------------+  Summary: Right: Resting right ankle-brachial index indicates noncompressible right lower extremity arteries. Left: Resting left ankle-brachial index indicates noncompressible left lower extremity arteries.  *See table(s) above for measurements and observations.  Electronically signed by Harold Barban MD on 09/15/2018 at 1:50:47 PM.   Final    Ir Embo Art  [place] Guide Roadmapping  Result Date: 09/07/2018 INDICATION: 57 year old male with large volume upper GI bleed secondary to a very large duodenal ulcer. Patient underwent endoscopic intervention but continues to require pressors and large volume transfusion. He presents for mesenteric arteriography and embolization of any active bleeding and prophylactic embolization of the gastroduodenal artery. EXAM: IR ULTRASOUND GUIDANCE VASC ACCESS RIGHT; ADDITIONAL ARTERIOGRAPHY; IR EMBO ART VEN HEMORR LYMPH EXTRAV INC GUIDE ROADMAPPING; SELECTIVE VISCERAL ARTERIOGRAPHY 1. Ultrasound-guided vascular access 2. Catheterization of the left gastric artery where it arises directly from the aorta with arteriogram 3. Catheterization of the celiac artery with arteriogram  4. Catheterization of the common hepatic artery with arteriogram 5. Catheterization of the gastroduodenal artery with arteriogram 6. Coil embolization of the gastroduodenal artery 7. Catheterization of the superior mesenteric artery with arteriogram MEDICATIONS: None ANESTHESIA/SEDATION: Patient intubated and on propofol drip CONTRAST:  70 mL Isovue 370 FLUOROSCOPY TIME:  Fluoroscopy Time: 11 minutes 30 seconds (487 mGy). COMPLICATIONS: None immediate. PROCEDURE: Informed consent was obtained from the patient following explanation of the procedure, risks, benefits and alternatives. The patient understands, agrees and consents for the procedure. All questions were addressed. A time out was performed prior to the initiation of the procedure. Maximal barrier sterile technique utilized including caps, mask, sterile gowns, sterile gloves, large sterile drape, hand hygiene, and Betadine prep. The right common femoral artery was interrogated with ultrasound and found to be widely patent. An image was obtained and stored for the medical record. Local anesthesia was attained by infiltration with 1% lidocaine. A small dermatotomy was made. Under real-time sonographic guidance, the vessel was punctured with a 21 gauge micropuncture needle. Using standard technique, the initial micro needle was exchanged over a 0.018 micro wire for a transitional 4 [place] micro sheath. The micro sheath was then exchanged over a 0.035 wire for a 5 French vascular sheath. A C2 cobra catheter was advanced in the abdominal aorta over a Bentson wire. The catheter was used to select the first vessel arising from the ventral aorta. Contrast was injected. This is a left gastric artery arising directly from the aorta. Anatomy is normal. No evidence of active bleeding. The [employer]  catheter was then used to select the origin of the celiac artery. Arteriography was performed. The splenic artery is small in caliber. The hepatic artery demonstrates normal  hepatic arterial anatomy. The gastroduodenal artery is slightly irregular with spasm in the proximal segment. No evidence of active extravasation. A glidewire was successfully advanced into the common hepatic artery and the C2 cobra catheter was advanced into the common hepatic artery. Additional arteriography was performed in multiple obliquities. Again, the proximal gastroduodenal artery is irregular and spasmodic. No evidence of dissection, aneurysm or active bleeding. A Lantern microcatheter was then advanced over a Fathom 16 wire and used to select the distal aspect of the gastroduodenal artery. Arteriography was performed. The gastroepiploic arteries are widely patent. No evidence of active bleeding. Coil embolization was then performed using a 4 mm Ruby POD detachable micro coil followed by a 45 cm Ruby packing coil. Post embolization arteriography demonstrates complete occlusion of the vessel with stasis in the proximal segment. The Lantern microcatheter was brought back into the common hepatic artery and arteriography was performed. Again, complete occlusion of the gastroduodenal artery. The right gastric artery is visualized. No evidence of active bleeding from the right gastric artery. The microcatheter was removed. The C2 cobra catheter was used to select the superior mesenteric artery. Arteriography was performed. Patent pancreaticoduodenal arcade supplying the gastroepiploic artery. No evidence of active hemorrhage. The C2 cobra catheter was removed. A limited right common femoral arteriogram was performed confirming common femoral arterial access. Hemostasis was attained with the assistance of a Cordis ExoSeal extra arterial vascular plug. IMPRESSION: 1. No evidence of active hemorrhage. 2. 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 3. Prophylactic coil embolization of the gastroduodenal artery. Signed, Criselda Peaches, MD, [facility] Radiology Electronically Signed   By: Jacqulynn Cadet M.D.   On: 09/07/2018 22:32   Ct Angio Abd/pel W/ And/or W/o  Result Date: 09/11/2018 CLINICAL DATA:  Status postoperative right popliteal and tibial embolectomy for embolic disease. Known bilateral iliac artery aneurysmal disease. EXAM: CT ANGIOGRAPHY ABDOMEN AND PELVIS WITH CONTRAST TECHNIQUE: Multidetector CT imaging of the abdomen and pelvis was performed using the standard protocol during bolus administration of intravenous contrast. Multiplanar reconstructed images and MIPs were obtained and reviewed to evaluate the vascular anatomy. CONTRAST:  177m ISOVUE-370 IOPAMIDOL (ISOVUE-370) INJECTION 76% COMPARISON:  CTA of the abdomen on 01/30/2018 and CT of the abdomen and pelvis with contrast on 12/19/2017. FINDINGS: VASCULAR Aorta: Stable mild dilatation of the infrarenal aorta with maximal diameter of 3.1 cm. Celiac: Mild origin stenosis of 25-30%. Distal branches are patent. Status post transcatheter coil embolization of the gastroduodenal artery on 09/07/2018. Hepatic arteries are normally patent. SMA: Normally patent. Renals: Bilateral single renal arteries demonstrate normal patency. IMA: Normally patent. Inflow: Fusiform aneurysmal disease present of bilateral common iliac arteries as previously demonstrated. Maximal caliber of the right common iliac artery is 2.8-2.9 cm and maximal caliber of the right common iliac artery is 2.7 cm. These measurements are stable since prior studies. The significant change since the prior studies is a much more prominent and new component of mural thrombus in both common iliac arteries, right greater than left. In the proximal right common iliac artery, mural thrombus narrows the aneurysmal lumen by at least 50% in its narrowest portion. Eccentric mural thrombus is present at the origin of the right common iliac artery and also distally just prior to the common iliac bifurcation without  significant luminal stenosis. Aneurysmal disease again  noted of bilateral internal iliac arteries with the left measuring approximately 2.5 cm in greatest diameter and the right measuring 1.5 cm. Both internal iliac arteries also demonstrate significant mural thrombus without occlusion. Mural thrombus is stable to slightly more prominent compared to the 12/19/2017 study. Bilateral external iliac arteries are normally patent and demonstrate diffuse arteriomegaly with maximum caliber 1.5 cm. Proximal Outflow: Bilateral common femoral arteries demonstrate dilatation with the left measuring up to 2.1 cm in diameter. Mural thrombus is present along the posterior lumen with approximately 50% reduction in luminal diameter. No thrombus is seen extending into proximal SFA or profunda femoral arteries. On the right side the common femoral artery measures up to 1.8 cm in the femoral bifurcation appears widely patent. Veins: Delayed venous phase imaging shows patent venous structures without evidence of thrombus. There is some probable mass effect of ectatic external iliac arteries on the distal aspects of bilateral external iliac veins without evidence of associated deep venous thrombosis or significant collateral vein formation. Review of the MIP images confirms the above findings. NON-VASCULAR Lower chest: Small bilateral pleural effusions with bibasilar atelectasis. Hepatobiliary: No focal liver abnormality is seen. No gallstones, gallbladder wall thickening, or biliary dilatation. Pancreas: Since the March study, there is a change in appearance of the pancreas on the venous phase of imaging. The tail of the pancreas now appears progressively atrophic with associated pancreatic ductal dilatation up to approximately 7 mm. At the junction of the body and head of the pancreas, there is suggestion a potential ill-defined low-attenuation mass that could measure as much as 3 cm in AP diameter. Borders are very difficult to  delineate by CT. There is no associated biliary obstruction. 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 Recommend eventual correlation with MRI of the abdomen with and without gadolinium. There may be a window of time recommended to wait to perform MRI after placement of the gastroduodenal artery Ruby coils. This can be further investigated based on manufacturer recommendation. If there may be a significant delay in ability to perform MRI, consider endoscopic ultrasound to investigate the pancreas. Spleen: Normal in size without focal abnormality. Adrenals/Urinary Tract: Adrenal glands are unremarkable. Kidneys are normal, without renal calculi, focal lesion, or hydronephrosis. Bladder is unremarkable. Stomach/Bowel: Gastric fold thickening and prominent fold thickening of the duodenum may be consistent with known peptic ulcer disease. No evidence of bowel obstruction or free intraperitoneal air. No focal abscess identified. Lymphatic: No enlarged lymph nodes identified. Reproductive: Prostate is unremarkable. Other: Diffuse anasarca of the body wall and edema of the mesenteric and retroperitoneal fat likely relate to low albumin and malnutrition. Musculoskeletal: Moderate degenerative disc disease at L5-S1. IMPRESSION: VASCULAR 1. 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 No complications evident after embolization. 2. Stable mild aneurysmal disease of the distal abdominal aorta measuring 3.1 cm. 3. 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 The major change since prior imaging is significant increase in mural thrombus within the dilated common iliac arteries bilaterally, especially on the right. This is most likely the source distal emboli to the right distal leg. Mural thrombus in the internal iliac artery aneurysms is stable to slightly more prominent. 4. Stable diffuse enlargement bilateral external iliac and common femoral  arteries. The left common femoral artery does contain significant posterior wall mural thrombus. NON-VASCULAR 1. The most significant nonvascular finding is potential development of a mass of the pancreas at the juncture of the head and body causing progressive atrophy of the tail  of the pancreas with associated pancreatic ductal dilatation. On the venous phase of imaging, delineation of the potential mass is quite vague but the region of mass may measure as much as 3 cm. There may be some issue with immediate performance of MRI in the setting recent coiling of the gastroduodenal artery even if the coils are MRI compatible. This can be checked with the manufacturer recommendation. If MRI can be performed immediately, an MRI of the abdomen with and without gadolinium would be helpful for further evaluation. If there is going to be significant delay, consider EUS characterization of the pancreas. 2. Diffuse body wall anasarca and edema of the mesenteric fat and retroperitoneum likely reflects low albumin and malnutrition. 3. Prominent gastric folds and duodenal folds without evidence of bowel obstruction or perforation. Electronically Signed   By: Aletta Edouard M.D.   On: 09/11/2018 12:36      IMPRESSION: Adenocarcinoma of the Pancreas, appears localized but unresectable.  He would be a potential candidate for chemo/xrt vs. Full dose chemotherapy.  He has a significant problem with a bleeding duodenal ulcer not caused by cancer. Doubt  radiation therapy would be of much benefit in helping this issue since it is not caused by malignancy.  PLAN: pending further review with medical oncology.   ------------------------------------------------  Blair Promise, PhD, MD  This document serves as a record of services personally performed by Gery Pray, MD. It was created on his behalf by Rae Lips, a trained medical scribe. The creation of this record is based on the scribe's personal observations and the  provider's statements to them. This document has been checked and approved by the attending provider.

## 2018-09-23 NOTE — Progress Notes (Addendum)
PROGRESS NOTE        PATIENT DETAILS Name: Ryan Medina Age: 58 y.o. Sex: male Date of Birth: 1960-10-28 Admit Date: 09/03/2018 Admitting Physician [name], MD [id number], No Pcp Per  Brief Narrative: Patient is a 58 y.o. male with history of cocaine use, chronic systolic heart failure presented to the hospital on 12/13 with a right ischemic leg secondary to acute popliteal and tibial embolus, underwent embolectomy on 12/13 and subsequently was placed on a heparin drip.  He unfortunately developed a acute right lower leg hematoma with compartment syndrome requiring fasciotomy on 12/15.  Further hospital course was complicated by development of hemorrhagic shock with acute blood loss anemia secondary to a bleeding duodenal ulcer-requiring ICU transfer-intubation for airway protection and IR evaluation with mesenteric angiogram and GDA embolization.  Upon stability he was transferred back to the triad hospitalist service on 12/19.  See below for further details.  Basically now waiting for SNF bed.  Subjective:  Patient in bed, appears comfortable, denies any headache, no fever, no chest pain or pressure, no shortness of breath , no abdominal pain. No focal weakness.   Assessment/Plan:  Right ischemic leg secondary to popliteal artery/tibial artery embolism-s/p embolectomy on 32/67 complicated by right lower extremity hematoma with compartment syndrome requiring fasciotomy on 12/15:   Source of embolization unclear, required fasciotomy on 09/05/2018 by vascular surgery status post closure of fasciotomy on 09/10/2018.  His course was complicated by large hematoma in the right leg along with significant life-threatening GI bleed. Case discussed with vascular surgeon Dr. Monica Martinez who wants to continue anticoagulation as much as possible.   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  Kindly see anticoagulation below.   Cardiology was consulted for TEE await report, Cards reminded x 3 (09/22/17) verbally told no clots, EF 65%, negative for PFO.  Upper GI bleeding with hemorrhagic shock and acute blood loss anemia: Developed hemorrhagic shock, was seen by GI, required multiple PRBC transfusions last transfusion on 09/09/2018.  Was seen by GI and eventually underwent gastroduodenal artery embolization on 09/07/2018.  Total 8 units of packed RBC, 1 unit of FFP and 1 unit of platelets transfused around 09/08/2018. GI note noted from 09/10/2018 (okay with heparin challenge).  He was rechallenged with heparin cautiously on 09/12/2018 unfortunately he bled again on 09/14/2018 after which heparin was stopped and he received another 3 units of packed RBCs.  So far he has received 11 units of packed RBCs.  He is getting 12th unit of packed RBC on 09/21/2018 but oncology.  His H&H appears stable.  He also underwent EUS on 09/16/2018 which showed +ve Duodenal Ulcer which was still raw. Continue PPI and Hold Heparin indefinitely.   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  MRI noted & confirms mass, CA 19.9 was elevated, GI seeing and underwent EUS with biopsy on 09/16/2018, biopsy consistent with adenocarcinoma. Oncology following defer further management of this problem to oncology.  Post discharge will follow with Dr. Marin Olp.  Hypokalemia and hypomagnesemia.  Replaced and now stable.    Urinary retention.  On Foley and Flomax.  Foley removed on 09/15/2018, stable emptying bladder.     Acute hypoxic respiratory failure: Intubated when he developed hemorrhagic shock secondary to GI bleeding, successfully extubated-currently stable on 2 L of oxygen via nasal cannula.  Chronic systolic heart failure (EF  45-50% by TTE on 09/04/2018): Reasonably well compensated-follow weights, volume status, electrolytes closely.  Cocaine use/tobacco use: Counseled-I am still not sure if he has any intention of quitting.  3 cm AAA:  Stable for follow-up in the outpatient setting  ARF.  Developed on 09/23/2018.  No clear reason.  No offending medications or hypotension.  Hydrate, check renal electrolytes and renal ultrasound, repeat BMP in the morning.    Insulin-dependent DM-2: Last A1c on 12/2 was 18.2.  Labile sugars with episodes of hypoglycemia, have adjusted Lantus dose and sliding scale on 09/22/18.  We will continue to monitor closely he is also very noncompliant with diet, will monitor.  CBG (last 3)  Recent Labs    09/22/18 1629 09/22/18 2152 09/23/18 0809  GLUCAP 230* 170* 294*      DVT Prophylaxis: Heparin drip had to be stopped on 09/15/2018 due to GI bleed, will place SCDs in the left leg.  Code Status: Full code   Family Communication: Wife at bedside 12/25 & 12/26  Disposition Plan:  SNF once bed available  Antimicrobial agents: Anti-infectives (From admission, onward)   Start     Dose/Rate Route Frequency Ordered Stop   09/10/18 2130  ceFAZolin (ANCEF) IVPB 2g/100 mL premix     2 g 200 mL/hr over 30 Minutes Intravenous Every 8 hours 09/10/18 1634 09/11/18 0700   09/10/18 0800  ceFAZolin (ANCEF) IVPB 1 g/50 mL premix    Note to Pharmacy:  Send with pt to OR   1 g 100 mL/hr over 30 Minutes Intravenous On call 09/09/18 0756 09/10/18 1407   09/07/18 1300  erythromycin 250 mg in sodium chloride 0.9 % 100 mL IVPB     250 mg 100 mL/hr over 60 Minutes Intravenous Once 09/07/18 1125 09/07/18 2351   09/06/18 0000  ceFAZolin (ANCEF) IVPB 1 g/50 mL premix    Note to Pharmacy:  Send with pt to OR   1 g 100 mL/hr over 30 Minutes Intravenous On call 09/05/18 0849 09/05/18 0952   09/03/18 2245  ceFAZolin (ANCEF) IVPB 2g/100 mL premix     2 g 200 mL/hr over 30 Minutes Intravenous Every 8 hours 09/03/18 2236 09/04/18 1444   09/03/18 1615  ceFAZolin (ANCEF) IVPB 2g/100 mL premix  Status:  Discontinued     2 g 200 mL/hr over 30 Minutes Intravenous On call to O.R. 09/03/18 1600 09/03/18 2035       Procedures:  12/17>>Mesenteric angiogram and coil embolization of the GDA  12/17>>EGD  12/15>> 1.  Evacuation hematoma right leg 2.  Evacuation of lymphocele right leg 3.  4 compartment fasciotomy 4.  Placement of VAC (medial and lateral)  12/13>> 1.  Ultrasound-guided access to the left common femoral artery 2.  Aortogram with bilateral iliac arteriogram 3.  Selective catheterization of the right external iliac artery with right lower extremity runoff  MRI Abd.  Pancreatic mass.   TTE - Left ventricle: Wall thickness was increased in a pattern of mild LVH. Systolic function was mildly reduced. The estimated ejection fraction was in the range of 45% to 50%. Wall motion was normal; there were no regional wall motion abnormalities. Doppler parameters are consistent with abnormal left ventricular relaxation (grade 1 diastolic dysfunction). - Aortic valve: There was trivial regurgitation. - Mitral valve: Mildly calcified annulus. There was mild regurgitation. - Right atrium: Central venous pressure (est): 3 mm Hg. - Tricuspid valve: There was mild regurgitation. - Pulmonary arteries: Systolic pressure could not be accurately estimated. - Pericardium, extracardiac:  There was no pericardial effusion.   Renal US -  TEE -    EUS  -  Impression:    - 3.6cm by 2.1cm mass in the neck of pancreas that surrounds the celiac trunk, obstructs the main pancreatic duct and appears to have also invaded the duodenal bulb created a large, malignant, friable ulcer. I biopsied the abnormal, firm mucosa surrounding the duodenal ulcer and sampled the pancreatic mass with transgastric EUS FNA. Preliminary cytology review from the pancreatic mass was positive for malignancy.   Recommendation   - Return patient to hospital [facility] for ongoing care. - He has recieved 13 units of blood this admission, 3 of them in the past 24 hours. He will continue to ooze from the duodenal ulcer that I suspect is  malignant, related to the nearby large pancreatic mass. Judicious use of bloodthinners is strongly recommended. - Await final cytology/pathology reports; both hopefully available tomorrow.        CONSULTS:  pulmonary/intensive care, vascular surgery and IR, GI, Cards  Time spent: 40 minutes-Greater than 50% of this time was spent in counseling, explanation of diagnosis, planning of further management, and coordination of care.  MEDICATIONS: Scheduled Meds: . atorvastatin  20 mg Oral Daily  . chlorhexidine gluconate (MEDLINE KIT)  15 mL Mouth Rinse BID  . insulin aspart  0-5 Units Subcutaneous QHS  . insulin aspart  0-9 Units Subcutaneous TID WC  . insulin glargine  9 Units Subcutaneous Daily  . lidocaine  15 mL Oral Once  . mouth rinse  15 mL Mouth Rinse BID  . pantoprazole  40 mg Oral BID  . tamsulosin  0.4 mg Oral Daily   Continuous Infusions: . sodium chloride    . ferumoxytol    . lactated ringers 100 mL/hr at 09/23/18 0911   PRN Meds:.sodium chloride, alum & mag hydroxide-simeth, famotidine, ipratropium-albuterol, LORazepam, morphine injection, [DISCONTINUED] ondansetron **OR** ondansetron (ZOFRAN) IV, oxyCODONE-acetaminophen, polyethylene glycol, senna-docusate, sodium chloride flush, zolpidem   PHYSICAL EXAM: Vital signs: Vitals:   09/22/18 0548 09/22/18 1347 09/22/18 2153 09/23/18 0649  BP: (!) 132/93 134/90 (!) 134/94 (!) 141/101  Pulse: 90 92 93 94  Resp: 18 18 18 17   Temp: 98 F (36.7 C) 98 F (36.7 C) 98.7 F (37.1 C) 98.3 F (36.8 C)  TempSrc: Oral Oral Oral Oral  SpO2: 100% 100% 100% 99%  Weight:      Height:       Filed Weights   09/03/18 1650 09/16/18 1325  Weight: 66.7 kg 66.7 kg   Body mass index is 20.51 kg/m.   Exam  Awake Alert, Oriented X 3, No new F.N deficits, Normal affect [place].AT,PERRAL Supple Neck,No JVD, No cervical lymphadenopathy appriciated.  Symmetrical Chest wall movement, Good air movement bilaterally, CTAB RRR,No  Gallops, Rubs or new Murmurs, No Parasternal Heave +ve B.Sounds, Abd Soft, No tenderness, No organomegaly appriciated, No rebound - guarding or rigidity. No Cyanosis, Clubbing or edema, No new Rash or bruise, R leg staples stable   I have personally reviewed following labs and imaging studies  LABORATORY DATA: CBC: Recent Labs  Lab 09/18/18 0426 09/19/18 0356 09/20/18 0421 09/21/18 0350 09/22/18 0436  WBC 19.4* 14.0* 10.1 7.9 8.1  HGB 8.6* 8.4* 8.2* 8.4* 10.1*  HCT 25.6* 25.2* 25.6* 26.0* 30.7*  MCV 84.5 85.4 85.6 85.5 83.7  PLT 382 390 393 444* 469*    Basic Metabolic Panel: Recent Labs  Lab 09/17/18 0358 09/18/18 0426 09/19/18 0356 09/20/18 0421 09/21/18 0350 09/22/18 0436 09/23/18  0433  NA 135 132* 133* 132* 136 136 133*  K 3.4* 3.9 3.4* 3.8 4.2 3.8 4.5  CL 101 99 98 96* 100 96* 100  CO2 25 25 26 28 28 31  21*  GLUCOSE 68* 101* 235* 264* 262* 346* 251*  BUN 8 7 7 10 10 10  33*  CREATININE 0.62 0.59* 0.64 0.61 0.48* 0.52* 1.86*  CALCIUM 7.5* 7.5* 7.6* 7.6* 7.8* 7.9* 8.2*  MG 1.6* 1.8  --  1.9  --   --  1.6*    GFR: Estimated Creatinine Clearance: 41.3 mL/min (A) (by C-G formula based on SCr of 1.86 mg/dL (H)).  Liver Function Tests: Recent Labs  Lab 09/18/18 0426 09/19/18 0356 09/20/18 0421 09/21/18 0350 09/22/18 0436  AST 19 16 14* 15 16  ALT 24 22 21 20 21   ALKPHOS 69 70 65 64 65  BILITOT 0.6 0.4 0.4 0.3 0.4  PROT 4.6* 4.8* 4.8* 5.2* 5.3*  ALBUMIN 1.6* 1.5* 1.6* 1.6* 1.6*   No results for input(s): LIPASE, AMYLASE in the last 168 hours. No results for input(s): AMMONIA in the last 168 hours.  Coagulation Profile: No results for input(s): INR, PROTIME in the last 168 hours.  Cardiac Enzymes: No results for input(s): CKTOTAL, CKMB, CKMBINDEX, TROPONINI in the last 168 hours.  BNP (last 3 results) No results for input(s): PROBNP in the last 8760 hours.  HbA1C: No results for input(s): HGBA1C in the last 72 hours.  CBG: Recent Labs  Lab  09/22/18 0754 09/22/18 1154 09/22/18 1629 09/22/18 2152 09/23/18 0809  GLUCAP 280* 285* 230* 170* 294*    Lipid Profile: No results for input(s): CHOL, HDL, LDLCALC, TRIG, CHOLHDL, LDLDIRECT in the last 72 hours.  Thyroid Function Tests: No results for input(s): TSH, T4TOTAL, FREET4, T3FREE, THYROIDAB in the last 72 hours.  Anemia Panel: Recent Labs    09/21/18 0350  FERRITIN 112  TIBC 190*  IRON 8*    Urine analysis:    Component Value Date/Time   COLORURINE YELLOW 09/18/2018 1152   APPEARANCEUR HAZY (A) 09/18/2018 1152   LABSPEC 1.011 09/18/2018 1152   PHURINE 6.0 09/18/2018 1152   GLUCOSEU NEGATIVE 09/18/2018 1152   HGBUR NEGATIVE 09/18/2018 1152   BILIRUBINUR NEGATIVE 09/18/2018 1152   KETONESUR NEGATIVE 09/18/2018 1152   PROTEINUR NEGATIVE 09/18/2018 1152   UROBILINOGEN 4.0 (H) 12/05/2016 0921   NITRITE NEGATIVE 09/18/2018 1152   LEUKOCYTESUR SMALL (A) 09/18/2018 1152    Sepsis Labs: Lactic Acid, Venous    Component Value Date/Time   LATICACIDVEN 1.7 09/07/2018 1600    MICROBIOLOGY: No results found for this or any previous visit (from the past 240 hour(s)).  RADIOLOGY STUDIES/RESULTS: Dg Chest 2 View  Result Date: 09/18/2018 CLINICAL DATA:  58 year old former smoker who had a fever and shortness of breath yesterday. Breathing has improved significantly after nebulizer treatment. Follow-up pneumonia. EXAM: CHEST - 2 VIEW COMPARISON:  09/09/2018 and earlier, including CT chest 09/06/2018. FINDINGS: Cardiac silhouette normal in size, unchanged. Thoracic aorta mildly tortuous, unchanged. Hilar and mediastinal contours otherwise unremarkable. Mild emphysematous changes in both lungs as noted on the recent CT. Linear atelectasis or developing scar in the LEFT LOWER LOBE at the site of the recent pneumonia. Lungs otherwise clear. No localized airspace consolidation. No pleural effusions. No pneumothorax. Normal pulmonary vascularity. Visualized bony thorax  intact. IMPRESSION: COPD/emphysema. Linear atelectasis or developing scar in the LEFT LOWER LOBE at the site of the recent pneumonia. No acute cardiopulmonary disease otherwise. Electronically Signed   By: Marcello Moores  Lawrence M.D.   On: 09/18/2018 12:41   Dg Abd 1 View  Result Date: 09/07/2018 CLINICAL DATA:  OG tube placement. EXAM: ABDOMEN - 1 VIEW COMPARISON:  CT abdomen pelvis 08/22/2018. FINDINGS: OG tube is in place with the side port and tip in the body of the stomach. The stomach is distended. IMPRESSION: OG tube in good position. Electronically Signed   By: Inge Rise M.D.   On: 09/07/2018 13:31   Ct Angio Chest Pe W Or Wo Contrast  Result Date: 09/06/2018 CLINICAL DATA:  Evaluate thoracic aortic aneurysm. EXAM: CT ANGIOGRAPHY CHEST WITH CONTRAST TECHNIQUE: Multidetector CT imaging of the chest was performed using the standard protocol during bolus administration of intravenous contrast. Multiplanar CT image reconstructions and MIPs were obtained to evaluate the vascular anatomy. CONTRAST:  65 mL ISOVUE-370 IOPAMIDOL (ISOVUE-370) INJECTION 76% COMPARISON:  01/30/2018; 10/14/2016; 04/25/2026 FINDINGS: Vascular Findings: No evidence of thoracic aortic aneurysm with measurements as follows. No definite evidence of thoracic aortic dissection or periaortic stranding on this nongated examination. Bovine configuration of the aortic arch. The branch vessels of the aortic arch appear widely patent throughout their imaged courses. The descending thoracic aorta is of normal caliber and widely patent without hemodynamically significant stenosis. There is a very minimal amount of atherosclerotic plaque involving the proximal aspect of the descending thoracic aorta. Cardiomegaly.  No pericardial effusion. Although this examination was not tailored for the evaluation the pulmonary arteries, there are no discrete filling defects within the central pulmonary arterial tree to suggest central pulmonary embolism.  Borderline enlarged caliber the main pulmonary artery measuring 32 mm in diameter (image 67, series 6) ------------------------------------------------------------- Thoracic aortic measurements: Sinotubular junction 33 mm as measured in greatest oblique coronal dimension. Proximal ascending aorta 35 mm as measured in greatest oblique axial dimension at the level of the main pulmonary artery (image 70, series 6); and approximately 36 mm in greatest oblique short axis coronal diameter (coronal image 48, series 9). Aortic arch aorta 28 mm as measured in greatest oblique sagittal dimension. Proximal descending thoracic aorta 30 mm as measured in greatest oblique axial dimension at the level of the main pulmonary artery. Distal descending thoracic aorta 29 mm as measured in greatest oblique axial dimension at the level of the diaphragmatic hiatus. Review of the MIP images confirms the above findings. ------------------------------------------------------------- Non-Vascular Findings: Mediastinum/Lymph Nodes: Prominent right hilar lymph nodes are not enlarged by size criteria with index right suprahilar lymph node measuring 0.8 cm in greatest short axis diameter (image 71, series 6) and index right infrahilar lymph node measuring 0.7 cm (image 88). No definitive bulky mediastinal, hilar or axillary lymphadenopathy. Lungs/Pleura: Interval development of small bilateral pleural effusions with worsening bibasilar consolidative opacities and associated air bronchograms, right greater than left. Interval development of an approximately 1.0 x 0.5 cm subpleural consolidative opacity within the right upper lobe (image 36, series 7). Unchanged punctate (approximately 3 mm) calcified granuloma within the left lower lobe (image 75, series 3). Evaluation for additional pulmonary nodules is degraded secondary to bibasilar airspace opacities. The central pulmonary airways appear widely patent. Upper abdomen: Fluid distention of the mid  and distal aspects of the esophagus with marked distension of the imaged superior aspect of the stomach. Musculoskeletal: No acute or aggressive osseous abnormalities. There is incomplete fusion involving the spinous processes of multiple thoracic vertebral bodies, unchanged. Regional soft tissues appear normal. Normal appearance of the thyroid gland. IMPRESSION: 1. No evidence of thoracic aortic aneurysm or dissection. 2. Small bilateral effusions with associated  bibasilar consolidative opacities with associated air bronchograms, atelectasis versus infiltrate. Follow-up chest radiograph in 4-6 weeks after treatment is recommended to ensure resolution. 3. Fluid distension of the mid and distal aspects of the esophagus as well as marked distension of the imaged superior aspect stomach - correlation for gastric outlet obstructive symptoms is advised. 4. Cardiomegaly with enlargement of the caliber the main pulmonary artery as could be seen in the setting of pulmonary arterial hypertension. Electronically Signed   By: Sandi Mariscal M.D.   On: 09/06/2018 09:20   Mr Abdomen W Wo Contrast  Result Date: 09/12/2018 CLINICAL DATA:  Suspicion of pancreatic neck mass on CT. EXAM: MRI ABDOMEN WITHOUT AND WITH CONTRAST TECHNIQUE: Multiplanar multisequence MR imaging of the abdomen was performed both before and after the administration of intravenous contrast. CONTRAST:  6 cc Gadavist COMPARISON:  CT of 1 day prior. Stone study of 08/22/2018. More remote CT of 12/19/2017. FINDINGS: Moderatemultifactorial degradation, including respiratory motion and susceptibility artifact from embolization coils. Lower chest: Bilateral pleural fluid with bibasilar airspace disease. Normal heart size. Hepatobiliary: Left hepatic lobe 9 mm cyst. Too small to characterizea right hepatic lobe lesion is most likely a cyst at 2 mm. Normal gallbladder, without biliary ductal dilatation. Pancreas: An area of soft tissue fullness and restricted  diffusion is identified within the pancreatic neck, corresponding to the abnormality on CT. This measures on the order of 3.9 x 3.6 cm on image 76/12. Also 3.6 x 3.6 cm on image 35/31. Results in pancreatic duct dilatation within the upstream body and tail on image 17/11. Involves the celiac and its branches including on image 75/12. Spleen:  Grossly within normal limits. Adrenals/Urinary Tract: Normal adrenal glands. Grossly normal kidneys, without hydronephrosis. Stomach/Bowel: Gastric distension with proximal wall and fold thickening, including on image 73/12. Grossly normal abdominal bowel loops. Vascular/Lymphatic: Normal caliber of the aorta. 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 Limited evaluation for abdominal adenopathy. Other:  Small volume abdominal ascites. Musculoskeletal: No acute osseous abnormality. IMPRESSION: 1. Moderate motion and susceptibility artifact degradation throughout. 2. Confirmation of a pancreatic neck mass, most consistent with adenocarcinoma. Involvement of the celiac and its branches, most consistent with a non-operative lesion. 3. Consider endoscopic ultrasound sampling. 4. Gastric wall and fold thickening, suggesting gastritis. 5. Small bilateral pleural effusions with adjacent airspace disease. Electronically Signed   By: Abigail Miyamoto M.D.   On: 09/12/2018 03:04   Ir Angiogram Visceral Selective  Result Date: 09/07/2018 INDICATION: 58 year old male with large volume upper GI bleed secondary to a very large duodenal ulcer. Patient underwent endoscopic intervention but continues to require pressors and large volume transfusion. He presents for mesenteric arteriography and embolization of any active bleeding and prophylactic embolization of the gastroduodenal artery. EXAM: IR ULTRASOUND GUIDANCE VASC ACCESS RIGHT; ADDITIONAL ARTERIOGRAPHY; IR EMBO ART VEN HEMORR LYMPH EXTRAV INC GUIDE ROADMAPPING; SELECTIVE VISCERAL ARTERIOGRAPHY 1. Ultrasound-guided vascular  access 2. Catheterization of the left gastric artery where it arises directly from the aorta with arteriogram 3. Catheterization of the celiac artery with arteriogram 4. Catheterization of the common hepatic artery with arteriogram 5. Catheterization of the gastroduodenal artery with arteriogram 6. Coil embolization of the gastroduodenal artery 7. Catheterization of the superior mesenteric artery with arteriogram MEDICATIONS: None ANESTHESIA/SEDATION: Patient intubated and on propofol drip CONTRAST:  70 mL Isovue 370 FLUOROSCOPY TIME:  Fluoroscopy Time: 11 minutes 30 seconds (487 mGy). COMPLICATIONS: None immediate. PROCEDURE: Informed consent was obtained from the patient following explanation of the procedure, risks, benefits and alternatives. The  patient understands, agrees and consents for the procedure. All questions were addressed. A time out was performed prior to the initiation of the procedure. Maximal barrier sterile technique utilized including caps, mask, sterile gowns, sterile gloves, large sterile drape, hand hygiene, and Betadine prep. The right common femoral artery was interrogated with ultrasound and found to be widely patent. An image was obtained and stored for the medical record. Local anesthesia was attained by infiltration with 1% lidocaine. A small dermatotomy was made. Under real-time sonographic guidance, the vessel was punctured with a 21 gauge micropuncture needle. Using standard technique, the initial micro needle was exchanged over a 0.018 micro wire for a transitional 4 [place] micro sheath. The micro sheath was then exchanged over a 0.035 wire for a 5 French vascular sheath. A C2 cobra catheter was advanced in the abdominal aorta over a Bentson wire. The catheter was used to select the first vessel arising from the ventral aorta. Contrast was injected. This is a left gastric artery arising directly from the aorta. Anatomy is normal. No evidence of active bleeding. The Cobra catheter  was then used to select the origin of the celiac artery. Arteriography was performed. The splenic artery is small in caliber. The hepatic artery demonstrates normal hepatic arterial anatomy. The gastroduodenal artery is slightly irregular with spasm in the proximal segment. No evidence of active extravasation. A glidewire was successfully advanced into the common hepatic artery and the C2 cobra catheter was advanced into the common hepatic artery. Additional arteriography was performed in multiple obliquities. Again, the proximal gastroduodenal artery is irregular and spasmodic. No evidence of dissection, aneurysm or active bleeding. A Lantern microcatheter was then advanced over a Fathom 16 wire and used to select the distal aspect of the gastroduodenal artery. Arteriography was performed. The gastroepiploic arteries are widely patent. No evidence of active bleeding. Coil embolization was then performed using a 4 mm Ruby POD detachable micro coil followed by a 45 cm Ruby packing coil. Post embolization arteriography demonstrates complete occlusion of the vessel with stasis in the proximal segment. The Lantern microcatheter was brought back into the common hepatic artery and arteriography was performed. Again, complete occlusion of the gastroduodenal artery. The right gastric artery is visualized. No evidence of active bleeding from the right gastric artery. The microcatheter was removed. The C2 cobra catheter was used to select the superior mesenteric artery. Arteriography was performed. Patent pancreaticoduodenal arcade supplying the gastroepiploic artery. No evidence of active hemorrhage. The C2 cobra catheter was removed. A limited right common femoral arteriogram was performed confirming common femoral arterial access. Hemostasis was attained with the assistance of a Cordis ExoSeal extra arterial vascular plug. IMPRESSION: 1. No evidence of active hemorrhage. 2. Irregular/spastic segment of the proximal  gastroduodenal artery. 3. Prophylactic coil embolization of the gastroduodenal artery. Signed, Criselda Peaches, MD, [place] [facility] Radiology Electronically Signed   By: Jacqulynn Cadet M.D.   On: 09/07/2018 22:32   Ir Angiogram Visceral Selective  Result Date: 09/07/2018 INDICATION: 58 year old male with large volume upper GI bleed secondary to a very large duodenal ulcer. Patient underwent endoscopic intervention but continues to require pressors and large volume transfusion. He presents for mesenteric arteriography and embolization of any active bleeding and prophylactic embolization of the gastroduodenal artery. EXAM: IR ULTRASOUND GUIDANCE VASC ACCESS RIGHT; ADDITIONAL ARTERIOGRAPHY; IR EMBO ART VEN HEMORR LYMPH EXTRAV INC GUIDE ROADMAPPING; SELECTIVE VISCERAL ARTERIOGRAPHY 1. Ultrasound-guided vascular access 2. Catheterization of the left gastric artery where it arises directly  from the aorta with arteriogram 3. Catheterization of the celiac artery with arteriogram 4. Catheterization of the common hepatic artery with arteriogram 5. Catheterization of the gastroduodenal artery with arteriogram 6. Coil embolization of the gastroduodenal artery 7. Catheterization of the superior mesenteric artery with arteriogram MEDICATIONS: None ANESTHESIA/SEDATION: Patient intubated and on propofol drip CONTRAST:  70 mL Isovue 370 FLUOROSCOPY TIME:  Fluoroscopy Time: 11 minutes 30 seconds (487 mGy). COMPLICATIONS: None immediate. PROCEDURE: Informed consent was obtained from the patient following explanation of the procedure, risks, benefits and alternatives. The patient understands, agrees and consents for the procedure. All questions were addressed. A time out was performed prior to the initiation of the procedure. Maximal barrier sterile technique utilized including caps, mask, sterile gowns, sterile gloves, large sterile drape, hand hygiene, and Betadine prep.  The right common femoral artery was interrogated with ultrasound and found to be widely patent. An image was obtained and stored for the medical record. Local anesthesia was attained by infiltration with 1% lidocaine. A small dermatotomy was made. Under real-time sonographic guidance, the vessel was punctured with a 21 gauge micropuncture needle. Using standard technique, the initial micro needle was exchanged over a 0.018 micro wire for a transitional 4 [place] micro sheath. The micro sheath was then exchanged over a 0.035 wire for a 5 French vascular sheath. A C2 cobra catheter was advanced in the abdominal aorta over a Bentson wire. The catheter was used to select the first vessel arising from the ventral aorta. Contrast was injected. This is a left gastric artery arising directly from the aorta. Anatomy is normal. No evidence of active bleeding. The Cobra catheter was then used to select the origin of the celiac artery. Arteriography was performed. The splenic artery is small in caliber. The hepatic artery demonstrates normal hepatic arterial anatomy. The gastroduodenal artery is slightly irregular with spasm in the proximal segment. No evidence of active extravasation. A glidewire was successfully advanced into the common hepatic artery and the C2 cobra catheter was advanced into the common hepatic artery. Additional arteriography was performed in multiple obliquities. Again, the proximal gastroduodenal artery is irregular and spasmodic. No evidence of dissection, aneurysm or active bleeding. A Lantern microcatheter was then advanced over a Fathom 16 wire and used to select the distal aspect of the gastroduodenal artery. Arteriography was performed. The gastroepiploic arteries are widely patent. No evidence of active bleeding. Coil embolization was then performed using a 4 mm Ruby POD detachable micro coil followed by a 45 cm Ruby packing coil. Post embolization arteriography demonstrates complete occlusion of  the vessel with stasis in the proximal segment. The Lantern microcatheter was brought back into the common hepatic artery and arteriography was performed. Again, complete occlusion of the gastroduodenal artery. The right gastric artery is visualized. No evidence of active bleeding from the right gastric artery. The microcatheter was removed. The C2 cobra catheter was used to select the superior mesenteric artery. Arteriography was performed. Patent pancreaticoduodenal arcade supplying the gastroepiploic artery. No evidence of active hemorrhage. The C2 cobra catheter was removed. A limited right common femoral arteriogram was performed confirming common femoral arterial access. Hemostasis was attained with the assistance of a Cordis ExoSeal extra arterial vascular plug. IMPRESSION: 1. No evidence of active hemorrhage. 2. 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 3. Prophylactic coil embolization of the gastroduodenal artery. Signed, Criselda Peaches, MD, [place] [facility] Radiology Electronically Signed   By: Jacqulynn Cadet M.D.   On: 09/07/2018 22:32  Ir Angiogram Selective Each Additional Vessel  Result Date: 09/07/2018 INDICATION: 58 year old male with large volume upper GI bleed secondary to a very large duodenal ulcer. Patient underwent endoscopic intervention but continues to require pressors and large volume transfusion. He presents for mesenteric arteriography and embolization of any active bleeding and prophylactic embolization of the gastroduodenal artery. EXAM: IR ULTRASOUND GUIDANCE VASC ACCESS RIGHT; ADDITIONAL ARTERIOGRAPHY; IR EMBO ART VEN HEMORR LYMPH EXTRAV INC GUIDE ROADMAPPING; SELECTIVE VISCERAL ARTERIOGRAPHY 1. Ultrasound-guided vascular access 2. Catheterization of the left gastric artery where it arises directly from the aorta with arteriogram 3. Catheterization of the celiac artery with arteriogram 4.  Catheterization of the common hepatic artery with arteriogram 5. Catheterization of the gastroduodenal artery with arteriogram 6. Coil embolization of the gastroduodenal artery 7. Catheterization of the superior mesenteric artery with arteriogram MEDICATIONS: None ANESTHESIA/SEDATION: Patient intubated and on propofol drip CONTRAST:  70 mL Isovue 370 FLUOROSCOPY TIME:  Fluoroscopy Time: 11 minutes 30 seconds (487 mGy). COMPLICATIONS: None immediate. PROCEDURE: Informed consent was obtained from the patient following explanation of the procedure, risks, benefits and alternatives. The patient understands, agrees and consents for the procedure. All questions were addressed. A time out was performed prior to the initiation of the procedure. Maximal barrier sterile technique utilized including caps, mask, sterile gowns, sterile gloves, large sterile drape, hand hygiene, and Betadine prep. The right common femoral artery was interrogated with ultrasound and found to be widely patent. An image was obtained and stored for the medical record. Local anesthesia was attained by infiltration with 1% lidocaine. A small dermatotomy was made. Under real-time sonographic guidance, the vessel was punctured with a 21 gauge micropuncture needle. Using standard technique, the initial micro needle was exchanged over a 0.018 micro wire for a transitional 4 [place] micro sheath. The micro sheath was then exchanged over a 0.035 wire for a 5 French vascular sheath. A C2 cobra catheter was advanced in the abdominal aorta over a Bentson wire. The catheter was used to select the first vessel arising from the ventral aorta. Contrast was injected. This is a left gastric artery arising directly from the aorta. Anatomy is normal. No evidence of active bleeding. The Cobra catheter was then used to select the origin of the celiac artery. Arteriography was performed. The splenic artery is small in caliber. The hepatic artery demonstrates normal hepatic  arterial anatomy. The gastroduodenal artery is slightly irregular with spasm in the proximal segment. No evidence of active extravasation. A glidewire was successfully advanced into the common hepatic artery and the C2 cobra catheter was advanced into the common hepatic artery. Additional arteriography was performed in multiple obliquities. Again, the proximal gastroduodenal artery is irregular and spasmodic. No evidence of dissection, aneurysm or active bleeding. A Lantern microcatheter was then advanced over a Fathom 16 wire and used to select the distal aspect of the gastroduodenal artery. Arteriography was performed. The gastroepiploic arteries are widely patent. No evidence of active bleeding. Coil embolization was then performed using a 4 mm Ruby POD detachable micro coil followed by a 45 cm Ruby packing coil. Post embolization arteriography demonstrates complete occlusion of the vessel with stasis in the proximal segment. The Lantern microcatheter was brought back into the common hepatic artery and arteriography was performed. Again, complete occlusion of the gastroduodenal artery. The right gastric artery is visualized. No evidence of active bleeding from the right gastric artery. The microcatheter was removed. The C2 cobra catheter was used to select the superior mesenteric artery. Arteriography was performed. Patent pancreaticoduodenal  arcade supplying the gastroepiploic artery. No evidence of active hemorrhage. The C2 cobra catheter was removed. A limited right common femoral arteriogram was performed confirming common femoral arterial access. Hemostasis was attained with the assistance of a Cordis ExoSeal extra arterial vascular plug. IMPRESSION: 1. No evidence of active hemorrhage. 2. 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 3. Prophylactic coil embolization of the gastroduodenal artery. Signed, Criselda Peaches, MD, [place] [facility]  [facility] Radiology Electronically Signed   By: Jacqulynn Cadet M.D.   On: 09/07/2018 22:32   Ir Angiogram Selective Each Additional Vessel  Result Date: 09/07/2018 INDICATION: 58 year old male with large volume upper GI bleed secondary to a very large duodenal ulcer. Patient underwent endoscopic intervention but continues to require pressors and large volume transfusion. He presents for mesenteric arteriography and embolization of any active bleeding and prophylactic embolization of the gastroduodenal artery. EXAM: IR ULTRASOUND GUIDANCE VASC ACCESS RIGHT; ADDITIONAL ARTERIOGRAPHY; IR EMBO ART VEN HEMORR LYMPH EXTRAV INC GUIDE ROADMAPPING; SELECTIVE VISCERAL ARTERIOGRAPHY 1. Ultrasound-guided vascular access 2. Catheterization of the left gastric artery where it arises directly from the aorta with arteriogram 3. Catheterization of the celiac artery with arteriogram 4. Catheterization of the common hepatic artery with arteriogram 5. Catheterization of the gastroduodenal artery with arteriogram 6. Coil embolization of the gastroduodenal artery 7. Catheterization of the superior mesenteric artery with arteriogram MEDICATIONS: None ANESTHESIA/SEDATION: Patient intubated and on propofol drip CONTRAST:  70 mL Isovue 370 FLUOROSCOPY TIME:  Fluoroscopy Time: 11 minutes 30 seconds (487 mGy). COMPLICATIONS: None immediate. PROCEDURE: Informed consent was obtained from the patient following explanation of the procedure, risks, benefits and alternatives. The patient understands, agrees and consents for the procedure. All questions were addressed. A time out was performed prior to the initiation of the procedure. Maximal barrier sterile technique utilized including caps, mask, sterile gowns, sterile gloves, large sterile drape, hand hygiene, and Betadine prep. The right common femoral artery was interrogated with ultrasound and found to be widely patent. An image was obtained and stored for the medical record. Local  anesthesia was attained by infiltration with 1% lidocaine. A small dermatotomy was made. Under real-time sonographic guidance, the vessel was punctured with a 21 gauge micropuncture needle. Using standard technique, the initial micro needle was exchanged over a 0.018 micro wire for a transitional 4 [place] micro sheath. The micro sheath was then exchanged over a 0.035 wire for a 5 French vascular sheath. A C2 cobra catheter was advanced in the abdominal aorta over a Bentson wire. The catheter was used to select the first vessel arising from the ventral aorta. Contrast was injected. This is a left gastric artery arising directly from the aorta. Anatomy is normal. No evidence of active bleeding. The Cobra catheter was then used to select the origin of the celiac artery. Arteriography was performed. The splenic artery is small in caliber. The hepatic artery demonstrates normal hepatic arterial anatomy. The gastroduodenal artery is slightly irregular with spasm in the proximal segment. No evidence of active extravasation. A glidewire was successfully advanced into the common hepatic artery and the C2 cobra catheter was advanced into the common hepatic artery. Additional arteriography was performed in multiple obliquities. Again, the proximal gastroduodenal artery is irregular and spasmodic. No evidence of dissection, aneurysm or active bleeding. A Lantern microcatheter was then advanced over a Fathom 16 wire and used to select the distal aspect of the gastroduodenal artery. Arteriography was performed. The gastroepiploic arteries are widely patent. No evidence of active bleeding. Coil  embolization was then performed using a 4 mm Ruby POD detachable micro coil followed by a 45 cm Ruby packing coil. Post embolization arteriography demonstrates complete occlusion of the vessel with stasis in the proximal segment. The Lantern microcatheter was brought back into the common hepatic artery and arteriography was performed.  Again, complete occlusion of the gastroduodenal artery. The right gastric artery is visualized. No evidence of active bleeding from the right gastric artery. The microcatheter was removed. The C2 cobra catheter was used to select the superior mesenteric artery. Arteriography was performed. Patent pancreaticoduodenal arcade supplying the gastroepiploic artery. No evidence of active hemorrhage. The C2 cobra catheter was removed. A limited right common femoral arteriogram was performed confirming common femoral arterial access. Hemostasis was attained with the assistance of a Cordis ExoSeal extra arterial vascular plug. IMPRESSION: 1. No evidence of active hemorrhage. 2. 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 3. Prophylactic coil embolization of the gastroduodenal artery. Signed, Criselda Peaches, MD, [place] [facility] Radiology Electronically Signed   By: Jacqulynn Cadet M.D.   On: 09/07/2018 22:32   Ir US Guide Vasc Access Right  Result Date: 09/07/2018 INDICATION: 58 year old male with large volume upper GI bleed secondary to a very large duodenal ulcer. Patient underwent endoscopic intervention but continues to require pressors and large volume transfusion. He presents for mesenteric arteriography and embolization of any active bleeding and prophylactic embolization of the gastroduodenal artery. EXAM: IR ULTRASOUND GUIDANCE VASC ACCESS RIGHT; ADDITIONAL ARTERIOGRAPHY; IR EMBO ART VEN HEMORR LYMPH EXTRAV INC GUIDE ROADMAPPING; SELECTIVE VISCERAL ARTERIOGRAPHY 1. Ultrasound-guided vascular access 2. Catheterization of the left gastric artery where it arises directly from the aorta with arteriogram 3. Catheterization of the celiac artery with arteriogram 4. Catheterization of the common hepatic artery with arteriogram 5. Catheterization of the gastroduodenal artery with arteriogram 6. Coil embolization of the gastroduodenal artery 7.  Catheterization of the superior mesenteric artery with arteriogram MEDICATIONS: None ANESTHESIA/SEDATION: Patient intubated and on propofol drip CONTRAST:  70 mL Isovue 370 FLUOROSCOPY TIME:  Fluoroscopy Time: 11 minutes 30 seconds (487 mGy). COMPLICATIONS: None immediate. PROCEDURE: Informed consent was obtained from the patient following explanation of the procedure, risks, benefits and alternatives. The patient understands, agrees and consents for the procedure. All questions were addressed. A time out was performed prior to the initiation of the procedure. Maximal barrier sterile technique utilized including caps, mask, sterile gowns, sterile gloves, large sterile drape, hand hygiene, and Betadine prep. The right common femoral artery was interrogated with ultrasound and found to be widely patent. An image was obtained and stored for the medical record. Local anesthesia was attained by infiltration with 1% lidocaine. A small dermatotomy was made. Under real-time sonographic guidance, the vessel was punctured with a 21 gauge micropuncture needle. Using standard technique, the initial micro needle was exchanged over a 0.018 micro wire for a transitional 4 [place] micro sheath. The micro sheath was then exchanged over a 0.035 wire for a 5 French vascular sheath. A C2 cobra catheter was advanced in the abdominal aorta over a Bentson wire. The catheter was used to select the first vessel arising from the ventral aorta. Contrast was injected. This is a left gastric artery arising directly from the aorta. Anatomy is normal. No evidence of active bleeding. The Cobra catheter was then used to select the origin of the celiac artery. Arteriography was performed. The splenic artery is small in caliber. The hepatic artery demonstrates normal hepatic arterial anatomy. The gastroduodenal artery is slightly  irregular with spasm in the proximal segment. No evidence of active extravasation. A glidewire was successfully advanced  into the common hepatic artery and the C2 cobra catheter was advanced into the common hepatic artery. Additional arteriography was performed in multiple obliquities. Again, the proximal gastroduodenal artery is irregular and spasmodic. No evidence of dissection, aneurysm or active bleeding. A Lantern microcatheter was then advanced over a Fathom 16 wire and used to select the distal aspect of the gastroduodenal artery. Arteriography was performed. The gastroepiploic arteries are widely patent. No evidence of active bleeding. Coil embolization was then performed using a 4 mm Ruby POD detachable micro coil followed by a 45 cm Ruby packing coil. Post embolization arteriography demonstrates complete occlusion of the vessel with stasis in the proximal segment. The Lantern microcatheter was brought back into the common hepatic artery and arteriography was performed. Again, complete occlusion of the gastroduodenal artery. The right gastric artery is visualized. No evidence of active bleeding from the right gastric artery. The microcatheter was removed. The C2 cobra catheter was used to select the superior mesenteric artery. Arteriography was performed. Patent pancreaticoduodenal arcade supplying the gastroepiploic artery. No evidence of active hemorrhage. The C2 cobra catheter was removed. A limited right common femoral arteriogram was performed confirming common femoral arterial access. Hemostasis was attained with the assistance of a Cordis ExoSeal extra arterial vascular plug. IMPRESSION: 1. No evidence of active hemorrhage. 2. 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 3. Prophylactic coil embolization of the gastroduodenal artery. Signed, Criselda Peaches, MD, [place] [facility] Radiology Electronically Signed   By: Jacqulynn Cadet M.D.   On: 09/07/2018 22:32   Dg Chest Port 1 View  Result Date: 09/09/2018 CLINICAL DATA:  Respiratory  failure EXAM: PORTABLE CHEST 1 VIEW COMPARISON:  09/07/2018 FINDINGS: Endotracheal tube and nasogastric catheter have been removed in the interval. Left jugular central line is again noted in the distal superior vena cava. The cardiac shadow is stable. The lungs are well aerated bilaterally with patchy bibasilar infiltrates and small left pleural effusion. IMPRESSION: Stable bibasilar infiltrates and small left pleural effusion. Electronically Signed   By: Inez Catalina M.D.   On: 09/09/2018 07:17   Dg Chest Port 1 View  Result Date: 09/07/2018 CLINICAL DATA:  Status post fasciotomy and hematoma back UA shins in the right lower leg 2 days ago. Intubated patient. EXAM: PORTABLE CHEST 1 VIEW COMPARISON:  CT scan of the chest of September 06, 2018 FINDINGS: The lungs are well-expanded. There are patchy increased densities at both bases which are slightly more conspicuous today. There is a small right and and somewhat larger left pleural effusion. The heart and pulmonary vascularity are normal. The endotracheal tube tip projects 4.1 cm above the carina. The esophagogastric tube tip in proximal port project below the GE junction. The left internal jugular venous catheter tip projects over the midportion of the SVC. IMPRESSION: Bibasilar atelectasis or pneumonia. Small right pleural effusion and somewhat larger left pleural effusion. The support tubes are in reasonable position. Electronically Signed   By: David  Martinique M.D.   On: 09/07/2018 13:27   Vas [place] Burnard Bunting With/wo Tbi  Result Date: 09/15/2018 LOWER EXTREMITY DOPPLER STUDY Indications: Peripheral artery disease.  Vascular Interventions: Status post right popliteal - tibial embolectomy and                         fasciotomy. Comparison Study: No exam for comparison Performing Technologist: [place], [place]  RVS  Examination Guidelines: A complete evaluation includes at minimum, Doppler waveform signals and systolic blood pressure reading at the level of  bilateral brachial, anterior tibial, and posterior tibial arteries, when vessel segments are accessible. Bilateral testing is considered an integral part of a complete examination. Photoelectric Plethysmograph (PPG) waveforms and toe systolic pressure readings are included as required and additional duplex testing as needed. Limited examinations for reoccurring indications may be performed as noted.  ABI Findings: +--------+------------------+-----+----------+--------+ Right   Rt Pressure (mmHg)IndexWaveform  Comment  +--------+------------------+-----+----------+--------+ [id number]                    triphasic          +--------+------------------+-----+----------+--------+ PTA     179               1.36 triphasic          +--------+------------------+-----+----------+--------+ DP      152               1.15 monophasic         +--------+------------------+-----+----------+--------+ +--------+------------------+-----+---------+-------+ Left    Lt Pressure (mmHg)IndexWaveform Comment +--------+------------------+-----+---------+-------+ [id number]                    triphasic        +--------+------------------+-----+---------+-------+ PTA     176               1.33 triphasic        +--------+------------------+-----+---------+-------+ DP      162               1.23 triphasic        +--------+------------------+-----+---------+-------+ +-------+-----------+-----------+------------+------------+ ABI/TBIToday's ABIToday's TBIPrevious ABIPrevious TBI +-------+-----------+-----------+------------+------------+ Right  1.36                                           +-------+-----------+-----------+------------+------------+ Left   1.33                                           +-------+-----------+-----------+------------+------------+  Summary: Right: Resting right ankle-brachial index indicates noncompressible right lower extremity arteries. Left:  Resting left ankle-brachial index indicates noncompressible left lower extremity arteries.  *See table(s) above for measurements and observations.  Electronically signed by Harold Barban MD on 09/15/2018 at 1:50:47 PM.   Final    Ir Embo Art  [place] Guide Roadmapping  Result Date: 09/07/2018 INDICATION: 58 year old male with large volume upper GI bleed secondary to a very large duodenal ulcer. Patient underwent endoscopic intervention but continues to require pressors and large volume transfusion. He presents for mesenteric arteriography and embolization of any active bleeding and prophylactic embolization of the gastroduodenal artery. EXAM: IR ULTRASOUND GUIDANCE VASC ACCESS RIGHT; ADDITIONAL ARTERIOGRAPHY; IR EMBO ART VEN HEMORR LYMPH EXTRAV INC GUIDE ROADMAPPING; SELECTIVE VISCERAL ARTERIOGRAPHY 1. Ultrasound-guided vascular access 2. Catheterization of the left gastric artery where it arises directly from the aorta with arteriogram 3. Catheterization of the celiac artery with arteriogram 4. Catheterization of the common hepatic artery with arteriogram 5. Catheterization of the gastroduodenal artery with arteriogram 6. Coil embolization of the gastroduodenal artery 7. Catheterization of the superior mesenteric artery with arteriogram MEDICATIONS: None ANESTHESIA/SEDATION: Patient intubated and on propofol drip CONTRAST:  70 mL Isovue 370 FLUOROSCOPY TIME:  Fluoroscopy Time: 11 minutes 30 seconds (487 mGy). COMPLICATIONS: None immediate. PROCEDURE: Informed consent was obtained from the patient following explanation of the procedure, risks, benefits and alternatives. The patient understands, agrees and consents for the procedure. All questions were addressed. A time out was performed prior to the initiation of the procedure. Maximal barrier sterile technique utilized including caps, mask, sterile gowns, sterile gloves, large sterile drape, hand hygiene, and Betadine prep. The right  common femoral artery was interrogated with ultrasound and found to be widely patent. An image was obtained and stored for the medical record. Local anesthesia was attained by infiltration with 1% lidocaine. A small dermatotomy was made. Under real-time sonographic guidance, the vessel was punctured with a 21 gauge micropuncture needle. Using standard technique, the initial micro needle was exchanged over a 0.018 micro wire for a transitional 4 [place] micro sheath. The micro sheath was then exchanged over a 0.035 wire for a 5 French vascular sheath. A C2 cobra catheter was advanced in the abdominal aorta over a Bentson wire. The catheter was used to select the first vessel arising from the ventral aorta. Contrast was injected. This is a left gastric artery arising directly from the aorta. Anatomy is normal. No evidence of active bleeding. The Cobra catheter was then used to select the origin of the celiac artery. Arteriography was performed. The splenic artery is small in caliber. The hepatic artery demonstrates normal hepatic arterial anatomy. The gastroduodenal artery is slightly irregular with spasm in the proximal segment. No evidence of active extravasation. A glidewire was successfully advanced into the common hepatic artery and the C2 cobra catheter was advanced into the common hepatic artery. Additional arteriography was performed in multiple obliquities. Again, the proximal gastroduodenal artery is irregular and spasmodic. No evidence of dissection, aneurysm or active bleeding. A Lantern microcatheter was then advanced over a Fathom 16 wire and used to select the distal aspect of the gastroduodenal artery. Arteriography was performed. The gastroepiploic arteries are widely patent. No evidence of active bleeding. Coil embolization was then performed using a 4 mm Ruby POD detachable micro coil followed by a 45 cm Ruby packing coil. Post embolization arteriography demonstrates complete occlusion of the vessel  with stasis in the proximal segment. The Lantern microcatheter was brought back into the common hepatic artery and arteriography was performed. Again, complete occlusion of the gastroduodenal artery. The right gastric artery is visualized. No evidence of active bleeding from the right gastric artery. The microcatheter was removed. The C2 cobra catheter was used to select the superior mesenteric artery. Arteriography was performed. Patent pancreaticoduodenal arcade supplying the gastroepiploic artery. No evidence of active hemorrhage. The C2 cobra catheter was removed. A limited right common femoral arteriogram was performed confirming common femoral arterial access. Hemostasis was attained with the assistance of a Cordis ExoSeal extra arterial vascular plug. IMPRESSION: 1. No evidence of active hemorrhage. 2. 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 3. Prophylactic coil embolization of the gastroduodenal artery. Signed, Criselda Peaches, MD, [facility] Electronically Signed   By: Jacqulynn Cadet M.D.   On: 09/07/2018 22:32   Ct Angio Abd/pel W/ And/or W/o  Result Date: 09/11/2018 CLINICAL DATA:  Status postoperative right popliteal and tibial embolectomy for embolic disease. Known bilateral iliac artery aneurysmal disease. EXAM: CT ANGIOGRAPHY ABDOMEN AND PELVIS WITH CONTRAST TECHNIQUE: Multidetector CT imaging of the abdomen and pelvis was performed using the standard protocol during bolus administration of intravenous contrast. Multiplanar reconstructed images and MIPs  were obtained and reviewed to evaluate the vascular anatomy. CONTRAST:  125m ISOVUE-370 IOPAMIDOL (ISOVUE-370) INJECTION 76% COMPARISON:  CTA of the abdomen on 01/30/2018 and CT of the abdomen and pelvis with contrast on 12/19/2017. FINDINGS: VASCULAR Aorta: Stable mild dilatation of the infrarenal aorta with maximal diameter of 3.1 cm. Celiac: Mild  origin stenosis of 25-30%. Distal branches are patent. Status post transcatheter coil embolization of the gastroduodenal artery on 09/07/2018. Hepatic arteries are normally patent. SMA: Normally patent. Renals: Bilateral single renal arteries demonstrate normal patency. IMA: Normally patent. Inflow: Fusiform aneurysmal disease present of bilateral common iliac arteries as previously demonstrated. Maximal caliber of the right common iliac artery is 2.8-2.9 cm and maximal caliber of the right common iliac artery is 2.7 cm. These measurements are stable since prior studies. The significant change since the prior studies is a much more prominent and new component of mural thrombus in both common iliac arteries, right greater than left. In the proximal right common iliac artery, mural thrombus narrows the aneurysmal lumen by at least 50% in its narrowest portion. Eccentric mural thrombus is present at the origin of the right common iliac artery and also distally just prior to the common iliac bifurcation without significant luminal stenosis. Aneurysmal disease again noted of bilateral internal iliac arteries with the left measuring approximately 2.5 cm in greatest diameter and the right measuring 1.5 cm. Both internal iliac arteries also demonstrate significant mural thrombus without occlusion. Mural thrombus is stable to slightly more prominent compared to the 12/19/2017 study. Bilateral external iliac arteries are normally patent and demonstrate diffuse arteriomegaly with maximum caliber 1.5 cm. Proximal Outflow: Bilateral common femoral arteries demonstrate dilatation with the left measuring up to 2.1 cm in diameter. Mural thrombus is present along the posterior lumen with approximately 50% reduction in luminal diameter. No thrombus is seen extending into proximal SFA or profunda femoral arteries. On the right side the common femoral artery measures up to 1.8 cm in the femoral bifurcation appears widely patent. Veins:  Delayed venous phase imaging shows patent venous structures without evidence of thrombus. There is some probable mass effect of ectatic external iliac arteries on the distal aspects of bilateral external iliac veins without evidence of associated deep venous thrombosis or significant collateral vein formation. Review of the MIP images confirms the above findings. NON-VASCULAR Lower chest: Small bilateral pleural effusions with bibasilar atelectasis. Hepatobiliary: No focal liver abnormality is seen. No gallstones, gallbladder wall thickening, or biliary dilatation. Pancreas: Since the March study, there is a change in appearance of the pancreas on the venous phase of imaging. The tail of the pancreas now appears progressively atrophic with associated pancreatic ductal dilatation up to approximately 7 mm. At the junction of the body and head of the pancreas, there is suggestion a potential ill-defined low-attenuation mass that could measure as much as 3 cm in AP diameter. Borders are very difficult to delineate by CT. There is no associated biliary obstruction. 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 Recommend eventual correlation with MRI of the abdomen with and without gadolinium. There may be a window of time recommended to wait to perform MRI after placement of the gastroduodenal artery Ruby coils. This can be further investigated based on manufacturer recommendation. If there may be a significant delay in ability to perform MRI, consider endoscopic ultrasound to investigate the pancreas. Spleen: Normal in size without focal abnormality. Adrenals/Urinary Tract: Adrenal glands are unremarkable. Kidneys are normal, without renal calculi, focal lesion, or hydronephrosis. Bladder is unremarkable. Stomach/Bowel: Gastric  fold thickening and prominent fold thickening of the duodenum may be consistent with known peptic ulcer disease. No evidence of bowel obstruction or free intraperitoneal air. No focal abscess  identified. Lymphatic: No enlarged lymph nodes identified. Reproductive: Prostate is unremarkable. Other: Diffuse anasarca of the body wall and edema of the mesenteric and retroperitoneal fat likely relate to low albumin and malnutrition. Musculoskeletal: Moderate degenerative disc disease at L5-S1. IMPRESSION: VASCULAR 1. 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 No complications evident after embolization. 2. Stable mild aneurysmal disease of the distal abdominal aorta measuring 3.1 cm. 3. 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 The major change since prior imaging is significant increase in mural thrombus within the dilated common iliac arteries bilaterally, especially on the right. This is most likely the source distal emboli to the right distal leg. Mural thrombus in the internal iliac artery aneurysms is stable to slightly more prominent. 4. Stable diffuse enlargement bilateral external iliac and common femoral arteries. The left common femoral artery does contain significant posterior wall mural thrombus. NON-VASCULAR 1. The most significant nonvascular finding is potential development of a mass of the pancreas at the juncture of the head and body causing progressive atrophy of the tail of the pancreas with associated pancreatic ductal dilatation. On the venous phase of imaging, delineation of the potential mass is quite vague but the region of mass may measure as much as 3 cm. There may be some issue with immediate performance of MRI in the setting recent coiling of the gastroduodenal artery even if the coils are MRI compatible. This can be checked with the manufacturer recommendation. If MRI can be performed immediately, an MRI of the abdomen with and without gadolinium would be helpful for further evaluation. If there is going to be significant delay, consider EUS characterization of the pancreas. 2. Diffuse body wall anasarca and  edema of the mesenteric fat and retroperitoneum likely reflects low albumin and malnutrition. 3. Prominent gastric folds and duodenal folds without evidence of bowel obstruction or perforation. Electronically Signed   By: Aletta Edouard M.D.   On: 09/11/2018 12:36     LOS: 20 days   Signature  Lala Lund M.D on 09/23/2018 at 9:43 AM   -  To page go to [email or web address] - password [email or web address]

## 2018-09-23 NOTE — Progress Notes (Signed)
CSW still searching for accepting SNF bed.  Percell Locus Yarisa Lynam LCSW [phone]

## 2018-09-23 NOTE — Progress Notes (Signed)
Pt requests sleeping medication at 9:00am.  I informed him we only give sleeping medications at night.  Pt had 2 linen bags full of linens on chairs, multiple containers of food and milk that were room temperature, dressing supplies on the floor, SCD's that he refused were on the floor.    Pt's fiance, Lavella Lemons, acted upset that I cleaned up room and threw out old food and drinks, got rid of excess linens and moved dressing supplies off floor.    Pt states he will take his medications later and refused any more medications right now.  He said he will call me when he is ready to take any other medications.

## 2018-09-23 NOTE — Progress Notes (Signed)
Pt continues to challenge his orders.  Pt periodically puts vasoline on his staples and wraps it with a used ace wrap because the pressure makes it feel better.    Pt has refused to ambulate outside his room when asked twice.  Pt states it's because his leg hurts, but he refuses pain medications. He has also disconnected his IV.

## 2018-09-23 NOTE — Progress Notes (Signed)
Patient refused to allow this nurse to restart IV fluids this morning.Patient stated," I want to speak to the doctor first." Will report this off to oncoming shift.

## 2018-09-24 ENCOUNTER — Encounter (HOSPITAL_COMMUNITY): Payer: Self-pay | Admitting: Hematology & Oncology

## 2018-09-24 DIAGNOSIS — Z7189 Other specified counseling: Secondary | ICD-10-CM

## 2018-09-24 HISTORY — DX: Other specified counseling: Z71.89

## 2018-09-24 LAB — GLUCOSE, CAPILLARY
GLUCOSE-CAPILLARY: 154 mg/dL — AB (ref 70–99)
Glucose-Capillary: 233 mg/dL — ABNORMAL HIGH (ref 70–99)
Glucose-Capillary: 196 mg/dL — ABNORMAL HIGH (ref 70–99)
Glucose-Capillary: 173 mg/dL — ABNORMAL HIGH (ref 70–99)

## 2018-09-24 LAB — BASIC METABOLIC PANEL
Anion gap: 11 (ref 5–15)
BUN: 6 mg/dL (ref 6–20)
CO2: 27 mmol/L (ref 22–32)
Calcium: 8.3 mg/dL — ABNORMAL LOW (ref 8.9–10.3)
Chloride: 94 mmol/L — ABNORMAL LOW (ref 98–111)
Creatinine, Ser: 0.51 mg/dL — ABNORMAL LOW (ref 0.61–1.24)
GFR calc Af Amer: >60 mL/min (ref >60)
GFR calc non Af Amer: >60 mL/min (ref >60)
Glucose, Bld: 251 mg/dL — ABNORMAL HIGH (ref 70–99)
Potassium: 3.9 mmol/L (ref 3.5–5.1)
Sodium: 132 mmol/L — ABNORMAL LOW (ref 135–145)

## 2018-09-24 LAB — CBC
HCT: 30.5 % — ABNORMAL LOW (ref 39.0–52.0)
Hemoglobin: 10.5 g/dL — ABNORMAL LOW (ref 13.0–17.0)
MCH: 28.7 pg (ref 26.0–34.0)
MCHC: 34.4 g/dL (ref 30.0–36.0)
MCV: 83.3 fL (ref 80.0–100.0)
Platelets: 493 10*3/uL — ABNORMAL HIGH (ref 150–400)
RBC: 3.66 MIL/uL — AB (ref 4.22–5.81)
RDW: 13.5 % (ref 11.5–15.5)
WBC: 10.3 10*3/uL (ref 4.0–10.5)
nRBC: 0 % (ref 0.0–0.2)

## 2018-09-24 MED ORDER — DRONABINOL 5 MG PO CAPS
5.0000 mg | ORAL_CAPSULE | Freq: Two times a day (BID) | ORAL | Status: DC
  Administered 2018-09-25 – 2018-10-19 (×32): 5 mg via ORAL
  Filled 2018-09-24: qty 1
  Filled 2018-09-24: qty 2
  Filled 2018-09-24 (×3): qty 1
  Filled 2018-09-24: qty 2
  Filled 2018-09-24 (×3): qty 1
  Filled 2018-09-24: qty 2
  Filled 2018-09-24: qty 1
  Filled 2018-09-24: qty 2
  Filled 2018-09-24 (×2): qty 1
  Filled 2018-09-24: qty 2
  Filled 2018-09-24 (×3): qty 1
  Filled 2018-09-24: qty 2
  Filled 2018-09-24 (×2): qty 1
  Filled 2018-09-24: qty 2
  Filled 2018-09-24: qty 1
  Filled 2018-09-24: qty 2
  Filled 2018-09-24: qty 1
  Filled 2018-09-24: qty 2
  Filled 2018-09-24 (×4): qty 1
  Filled 2018-09-24: qty 2
  Filled 2018-09-24 (×3): qty 1
  Filled 2018-09-24: qty 2
  Filled 2018-09-24 (×8): qty 1
  Filled 2018-09-24: qty 2
  Filled 2018-09-24 (×2): qty 1
  Filled 2018-09-24: qty 2
  Filled 2018-09-24: qty 1

## 2018-09-24 MED ORDER — OXYCODONE HCL 5 MG PO TABS
10.0000 mg | ORAL_TABLET | ORAL | Status: DC | PRN
  Administered 2018-09-24 – 2018-11-03 (×41): 10 mg via ORAL
  Filled 2018-09-24 (×43): qty 2

## 2018-09-24 NOTE — Progress Notes (Signed)
CSW staffed case with CSW AD due to patient not having any SNF bed offers. Due to patient requiring outpatient Oncology follow up and transportation, CSW is unable to [place] SNF. The Difficult to Place list is now several months long, therefore, patient will likely stay in the hospital until medical team feels he is able to discharge home with family. Please have therapies continue to work with patient.   Percell Locus Rion Catala LCSW [phone]

## 2018-09-24 NOTE — Progress Notes (Addendum)
PROGRESS NOTE  Ryan Medina AJG:[id number] DOB: [birth year] DOA: 09/03/2018 PCP: Patient, No Pcp Per   LOS: 21 days   Brief Narrative / Interim history: Patient is a 58 y.o. male with history of cocaine use, chronic systolic heart failure presented to the hospital on 12/13 with a right ischemic leg secondary to acute popliteal and tibial embolus, underwent embolectomy on 12/13 and subsequently was placed on a heparin drip.  He unfortunately developed a acute right lower leg hematoma with compartment syndrome requiring fasciotomy on 12/15.  Further hospital course was complicated by development of hemorrhagic shock with acute blood loss anemia secondary to a bleeding duodenal ulcer-requiring ICU transfer-intubation for airway protection and IR evaluation with mesenteric angiogram and GDA embolization.  Upon stability he was transferred back to the [facility]ad hospitalist service on 12/19.  See below for further details.  Basically now waiting for SNF bed.  Subjective: Complains of right lower extremity pain, otherwise feeling well, no chest pain, no shortness of breath, no abdominal pain, nausea or vomiting  Assessment & Plan: Principal Problem:   Popliteal artery embolus (HCC) Active Problems:   DM (diabetes mellitus), type 2, uncontrolled ([place])   Essential hypertension   Dilated cardiomyopathy (HCC)   Chest pain   AAA (abdominal aortic aneurysm) (HCC)   Ischemic foot   Right leg claudication (HCC)   PVD (peripheral vascular disease) ([place])   Hemorrhagic shock ([place])   Nontraumatic ischemic infarction of muscle of right lower leg   Upper GI bleed   Cocaine abuse ([place])   Acute blood loss anemia   Chronic pain syndrome   Pancreatic mass   Duodenal ulcer with hemorrhage   Pancreatic adenocarcinoma (HCC)   Principal Problem Right ischemic leg secondary to popliteal artery/tibial artery embolism-s/p embolectomy on 35/59 complicated by right lower extremity hematoma with compartment syndrome  requiring fasciotomy on 12/15:   Source of embolization unclear, required fasciotomy on 09/05/2018 by vascular surgery status post closure of fasciotomy on 09/10/2018.  His course was complicated by large hematoma in the right leg along with significant life-threatening GI bleed. Case discussed with vascular surgeon Dr. Monica Martinez who wants to continue anticoagulation as much as possible.   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  Cardiology was consulted for TEE verbally told no clots, EF 65%, negative for PFO.  Final TEE read pending  Active problems Upper GI bleeding with hemorrhagic shock and acute blood loss anemia: Developed hemorrhagic shock, was seen by GI, required multiple PRBC transfusions last transfusion on 09/09/2018.  Was seen by GI and eventually underwent gastroduodenal artery embolization on 09/07/2018.  Total 8 units of packed RBC, 1 unit of FFP and 1 unit of platelets transfused around 09/08/2018. GI note noted from 09/10/2018 (okay with heparin challenge).  He was rechallenged with heparin cautiously on 09/12/2018 unfortunately he bled again on 09/14/2018 after which heparin was stopped and he received another 3 units of packed RBCs.  So far he has received 11 units of packed RBCs.  He is getting 12th unit of packed RBC on 09/21/2018 but oncology.  His H&H appears stable.  He also underwent EUS on 09/16/2018 which showed +ve Duodenal Ulcer which was still raw. Continue PPI and Hold Heparin indefinitely.  No further bleeding  Pancreatic adenocarcinoma new diagnosis this admission.  MRI noted & confirms mass, CA 19.9 was elevated, GI seeing and underwent EUS with biopsy on 09/16/2018, biopsy consistent with adenocarcinoma. Oncology following defer further management of this problem to oncology.  Post discharge will follow with Dr. Marin Olp.  Hypokalemia and hypomagnesemia.  Replaced and now stable.    Urinary retention.  On Foley and  Flomax.  Foley removed on 09/15/2018, stable emptying bladder.     Acute hypoxic respiratory failure: Intubated when he developed hemorrhagic shock secondary to GI bleeding, successfully extubated-currently stable on 2 L of oxygen via nasal cannula.  Chronic systolic heart failure (EF 45-50% by TTE on 09/04/2018): Reasonably well compensated-follow weights, volume status, electrolytes closely.  Cocaine use/tobacco use: Counseled-I am still not sure if he has any intention of quitting.  3 cm AAA: Stable for follow-up in the outpatient setting  Acute kidney injury.  Developed on 09/23/2018.  No clear reason.  No offending medications or hypotension.  Received IV fluids, renal ultrasound normal, repeat renal function within normal limits on 1/3,?  Lab error on 1/2  Insulin-dependent DM-2: Last A1c on 12/2 was 18.2.  Labile sugars with episodes of hypoglycemia, have adjusted Lantus dose and sliding scale on 09/22/18.  We will continue to monitor closely he is also very noncompliant with diet, will monitor.   Scheduled Meds: . atorvastatin  20 mg Oral QHS  . chlorhexidine gluconate (MEDLINE KIT)  15 mL Mouth Rinse BID  . insulin aspart  0-5 Units Subcutaneous QHS  . insulin aspart  0-9 Units Subcutaneous TID WC  . insulin glargine  9 Units Subcutaneous Daily  . lidocaine  15 mL Oral Once  . mouth rinse  15 mL Mouth Rinse BID  . pantoprazole  40 mg Oral BID  . tamsulosin  0.4 mg Oral QHS   Continuous Infusions: . sodium chloride     PRN Meds:.sodium chloride, alum & mag hydroxide-simeth, famotidine, ipratropium-albuterol, LORazepam, morphine injection, [DISCONTINUED] ondansetron **OR** ondansetron (ZOFRAN) IV, oxyCODONE-acetaminophen, polyethylene glycol, senna-docusate, sodium chloride flush, zolpidem  DVT prophylaxis: SCDs Code Status: Partial code Family Communication: Family at bedside Disposition Plan: SNF when bed available.  Difficult placement.  See SW note   Procedures:    2D echo:  12/17>>Mesenteric angiogram and coil embolization of the GDA  12/17>>EGD  12/15>> 1.Evacuation hematoma right leg 2.Evacuation of lymphocele right leg 3.4 compartment fasciotomy 4.Placement of VAC (medial and lateral)  12/13>> 1.Ultrasound-guided access to the left common femoral artery 2.Aortogram with bilateral iliac arteriogram 3.Selective catheterization of the right external iliac artery with right lower extremity runoff  MRI Abd.  Pancreatic mass.   TTE - Left ventricle: Wall thickness was increased in a pattern of mild LVH. Systolic function was mildly reduced. The estimated ejection fraction was in the range of 45% to 50%. Wall motion was normal; there were no regional wall motion abnormalities. Dopplerparameters are consistent with abnormal left ventricular relaxation (grade 1 diastolic dysfunction). - Aortic valve: There was trivial regurgitation. - Mitral valve: Mildly calcified annulus. There was mild regurgitation. - Right atrium: Central venous pressure (est): 3 mm Hg. - Tricuspid valve: There was mild regurgitation. - Pulmonary arteries: Systolic pressure could not be accurately estimated. - Pericardium, extracardiac: There was no pericardial effusion.  Antimicrobials:  None    Objective: Vitals:   09/23/18 0649 09/23/18 1518 09/23/18 2134 09/24/18 0504  BP: (!) 141/101 (!) 128/96 133/82 (!) 129/95  Pulse: 94 82 85 91  Resp: _0 Temp: 98.3 F (36.8 C) 98.6 F (37 C) 98.5 F (36.9 C) 98.2 F (36.8 C)  TempSrc: Oral  Oral Oral  SpO2: 99% 100% 97% 100%  Weight:      Height:  Intake/Output Summary (Last 24 hours) at 09/24/2018 1405 Last data filed at 09/24/2018 1300 Gross per 24 hour  Intake 1997.6 ml  Output 1850 ml  Net 147.6 ml   Filed Weights   09/03/18 1650 09/16/18 1325  Weight: 66.7 kg 66.7 kg    Examination:  Constitutional: NAD Eyes: PERRL, lids and conjunctivae normal ENMT: Mucous  membranes are moist. Neck: normal, supple Respiratory: clear to auscultation bilaterally, no wheezing, no crackles. Cardiovascular: Regular rate and rhythm. No LE edema. 2+ pedal pulses.  Abdomen: no tenderness.  Musculoskeletal: no clubbing / cyanosis.  Surgical staples right lower extremity medial and lateral aspects Skin: no rashes Neurologic: CN 2-12 grossly intact. Strength 5/5 in all 4.  Psychiatric: Normal judgment and insight. Alert and oriented x 3. Normal mood.    Data Reviewed: I have independently reviewed following labs and imaging studies   CBC: Recent Labs  Lab 09/19/18 0356 09/20/18 0421 09/21/18 0350 09/22/18 0436 09/24/18 0746  WBC 14.0* 10.1 7.9 8.1 10.3  HGB 8.4* 8.2* 8.4* 10.1* 10.5*  HCT 25.2* 25.6* 26.0* 30.7* 30.5*  MCV 85.4 85.6 85.5 83.7 83.3  PLT 390 393 444* 469* 268*   Basic Metabolic Panel: Recent Labs  Lab 09/18/18 0426  09/20/18 0421 09/21/18 0350 09/22/18 0436 09/23/18 0433 09/24/18 0746  NA 132*   < > 132* 136 136 133* 132*  K 3.9   < > 3.8 4.2 3.8 4.5 3.9  CL 99   < > 96* 100 96* 100 94*  CO2 25   < > _0 21* 27  GLUCOSE 101*   < > 264* 262* 346* 251* 251*  BUN 7   < > _1 33* 6  CREATININE 0.59*   < > 0.61 0.48* 0.52* 1.86* 0.51*  CALCIUM 7.5*   < > 7.6* 7.8* 7.9* 8.2* 8.3*  MG 1.8  --  1.9  --   --  1.6*  --    < > = values in this interval not displayed.   GFR: Estimated Creatinine Clearance: 96.1 mL/min (A) (by C-G formula based on SCr of 0.51 mg/dL (L)). Liver Function Tests: Recent Labs  Lab 09/18/18 0426 09/19/18 0356 09/20/18 0421 09/21/18 0350 09/22/18 0436  AST 19 16 14* 15 16  ALT _2 ALKPHOS 69 70 65 64 65  BILITOT 0.6 0.4 0.4 0.3 0.4  PROT 4.6* 4.8* 4.8* 5.2* 5.3*  ALBUMIN 1.6* 1.5* 1.6* 1.6* 1.6*   No results for input(s): LIPASE, AMYLASE in the last 168 hours. No results for input(s): AMMONIA in the last 168 hours. Coagulation Profile: No results for input(s): INR, PROTIME in the  last 168 hours. Cardiac Enzymes: No results for input(s): CKTOTAL, CKMB, CKMBINDEX, TROPONINI in the last 168 hours. BNP (last 3 results) No results for input(s): PROBNP in the last 8760 hours. HbA1C: No results for input(s): HGBA1C in the last 72 hours. CBG: Recent Labs  Lab 09/23/18 1200 09/23/18 1642 09/23/18 2147 09/24/18 0806 09/24/18 1205  GLUCAP 164* 231* 276* 233* 196*   Lipid Profile: No results for input(s): CHOL, HDL, LDLCALC, TRIG, CHOLHDL, LDLDIRECT in the last 72 hours. Thyroid Function Tests: No results for input(s): TSH, T4TOTAL, FREET4, T3FREE, THYROIDAB in the last 72 hours. Anemia Panel: No results for input(s): VITAMINB12, FOLATE, FERRITIN, TIBC, IRON, RETICCTPCT in the last 72 hours. Urine analysis:    Component Value Date/Time   COLORURINE YELLOW 09/18/2018 1152   APPEARANCEUR HAZY (A) 09/18/2018 1152   LABSPEC  1.011 09/18/2018 1152   PHURINE 6.0 09/18/2018 1152   GLUCOSEU NEGATIVE 09/18/2018 1152   HGBUR NEGATIVE 09/18/2018 [place] 09/18/2018 1152   KETONESUR NEGATIVE 09/18/2018 1152   PROTEINUR NEGATIVE 09/18/2018 1152   UROBILINOGEN 4.0 (H) 12/05/2016 0921   NITRITE NEGATIVE 09/18/2018 1152   LEUKOCYTESUR SMALL (A) 09/18/2018 1152   Sepsis Labs: Invalid input(s): PROCALCITONIN, LACTICIDVEN  No results found for this or any previous visit (from the past 240 hour(s)).    Radiology Studies: US Renal  Result Date: 09/23/2018 CLINICAL DATA:  Acute renal failure. EXAM: RENAL / URINARY TRACT ULTRASOUND COMPLETE COMPARISON:  CT angiogram abdomen and pelvis 09/11/2018. FINDINGS: Right Kidney: Renal measurements: 11.8 x 6.3 x 7.4 cm = volume: 289.4 mL. Cortical echogenicity is increased. No mass or hydronephrosis visualized. Left Kidney: Renal measurements: 11.7 x 5.7 x 5.8 cm = volume: 204.1 mL. Cortical echogenicity is increased. 0.7 cm in diameter cyst incidentally noted. No solid mass or hydronephrosis visualized. Bladder: Appears  normal for degree of bladder distention. IMPRESSION: Negative for hydronephrosis or acute abnormality. Increased cortical echogenicity bilaterally is compatible with medical renal disease. Electronically Signed   By: Inge Rise M.D.   On: 09/23/2018 11:22     Marzetta Board, MD, PhD [facility] Pager [phone]  If 7PM-7AM, please contact night-coverage [email or web address] Password [email or web address] 09/24/2018, 2:05 PM

## 2018-09-24 NOTE — Progress Notes (Signed)
Physical Therapy Treatment Patient Details Name: Ryan Medina MRN: [id number] DOB: 1961-01-28 Today's Date: 09/24/2018    History of Present Illness Patient is a 58 y.o. male with PMH including but not limited to DM and HTN who presented to the hospital on 12/13 with a right ischemic leg secondary to acute popliteal and tibial embolus, underwent embolectomy on 12/13 and subsequently was placed on a heparin drip.  He unfortunately developed a acute right lower leg hematoma with compartment syndrome requiring fasciotomy on 12/15.  Further hospital course was complicated by development of hemorrhagic shock with acute blood loss anemia secondary to a bleeding duodenal ulcer-requiring ICU transfer-intubation for airway protection and IR evaluation with mesenteric angiogram and GDA embolization.  Upon stability he was transferred back to the triad hospitalist service on 12/19. Pt is now s/p closure of R LE fasciotomy on 09/10/18. 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    PT Comments    Patient with reluctance to work with PT. Requires heavy motivation to ambulate. Ambulating to door and refusing further mobility as he states, "I walked to the nurses station yesterday." Patient also refusing to bear weight on R heel stating pain limiting. When encouraged to ambulate a greater distance/increase weight bearing friend becoming agitated stating "He says it hurts." Education on need for extended knee and ankle dorsiflexion to prevent contractures as well as progressive weight bearing to improve overall mobility and independence. Will continue to follow.     Follow Up Recommendations  SNF     Equipment Recommendations  Rolling walker with 5" wheels;3in1 (PT)    Recommendations for Other Services       Precautions / Restrictions Precautions Precautions: Fall Restrictions Weight Bearing Restrictions: Yes RLE Weight Bearing: Weight bearing as tolerated     Mobility  Bed Mobility Overal bed mobility: Needs Assistance Bed Mobility: Supine to Sit;Sit to Supine     Supine to sit: Supervision Sit to supine: Supervision   General bed mobility comments: supervision for safety  Transfers Overall transfer level: Needs assistance Equipment used: Rolling walker (2 wheeled) Transfers: Sit to/from [facility] Sit to Stand: Min guard Stand pivot transfers: Min guard       General transfer comment: for safety and balance; poor safety awareness  Ambulation/Gait Ambulation/Gait assistance: Min guard Gait Distance (Feet): 30 Feet Assistive device: Rolling walker (2 wheeled) Gait Pattern/deviations: Step-to pattern     General Gait Details: patient ambulating to door and back to bed; refused to go further as he states "I walked to the nurses station yesterday" patient only placing weight through R toes; refuses to place weight through R heel    Stairs             Wheelchair Mobility    Modified Rankin (Stroke Patients Only)       Balance Overall balance assessment: Needs assistance Sitting-balance support: Feet supported;No upper extremity supported Sitting balance-Leahy Scale: Fair     Standing balance support: Bilateral upper extremity supported Standing balance-Leahy Scale: Poor Standing balance comment: reliant on bilateral UEs on RW                            Cognition Arousal/Alertness: Awake/alert Behavior During Therapy: WFL for tasks assessed/performed Overall Cognitive Status: Within Functional Limits for tasks assessed  Exercises Other Exercises Other Exercises: Stretch right LE with sheet for calf and hamstring.(education on need for extended knee for best results)    General Comments        Pertinent Vitals/Pain Pain Assessment: Faces Faces Pain Scale: Hurts whole lot Pain Location: R LE Pain Descriptors /  Indicators: Grimacing;Guarding;Sore Pain Intervention(s): Limited activity within patient's tolerance;Monitored during session;Repositioned    Home Living                      Prior Function            PT Goals (current goals can now be found in the care plan section) Acute Rehab PT Goals Patient Stated Goal: decrease pain PT Goal Formulation: With patient Time For Goal Achievement: 09/25/18 Potential to Achieve Goals: Fair Progress towards PT goals: Progressing toward goals    Frequency    Min 2X/week      PT Plan Current plan remains appropriate    Co-evaluation              AM-PAC PT "6 Clicks" Mobility   Outcome Measure  Help needed turning from your back to your side while in a flat bed without using bedrails?: None Help needed moving from lying on your back to sitting on the side of a flat bed without using bedrails?: None Help needed moving to and from a bed to a chair (including a wheelchair)?: A Little Help needed standing up from a chair using your arms (e.g., wheelchair or bedside chair)?: A Little Help needed to walk in hospital room?: A Little Help needed climbing 3-5 steps with a railing? : Total 6 Click Score: 18    End of Session Equipment Utilized During Treatment: Gait belt Activity Tolerance: Patient limited by pain Patient left: in bed;with call bell/phone within reach;with family/visitor present Nurse Communication: Mobility status PT Visit Diagnosis: Other abnormalities of gait and mobility (R26.89);Pain Pain - Right/Left: Right Pain - part of body: Leg     Time: 6073-7106 PT Time Calculation (min) (ACUTE ONLY): 12 min  Charges:  $Gait Training: 8-22 mins                      Lanney Gins, PT, DPT Supplemental Physical Therapist 09/24/18 2:39 PM Pager: [phone] Office: [phone]

## 2018-09-24 NOTE — Progress Notes (Signed)
Looks like [name] is finally making some progress.  His hemoglobin is slowly coming up which is nice to see.  He says his stools are brown.  I think that the iron that he got helps.  I also think that he needs another dose of iron.  We probably cannot give this until next week.  We have to improve on his appetite.  I would give him some Marinol.  We will try Marinol at 5 mg p.o. twice daily and see if this helps.  I am still not sure what plans are being made for his rehab/discharge.  The pathology report does show metastatic adenocarcinoma of the pancreas.  His CA 19-9 is only 61.  I think that we do have the opportunity to be aggressive here with his malignancy.  I appreciate the input from radiation oncology.  Since he appears to be making some nice progress, I think that we might do better with his cancer if we treat him with upfront chemotherapy and then follow this with chemoradiation therapy.  I know that there is a lot of debate about whether chemotherapy upfront or chemoradiation therapy upfront is better.  I am not sure that any clinical trial shows a benefit with 1 over the other.  My concern is that typically, with pancreatic cancer, the tendency is for these tumors to metastasize quickly.  As such, I think that full dose chemotherapy upfront may not be a bad idea.  I believe that he will need to have a Port-A-Cath placed.  I talked to he and his fiance about this.  I explained what a Port-A-Cath is.  They agree to have this put in.  Maybe, radiology can do this early next week.  Since his hemoglobin is improving, and he is not bleeding, we might be able to think about getting him on anticoagulation for his arterial embolic events.  I realize that this is "tricky" and we have to really be cautious.  I think that what might be a good approach for [name] with respect to his therapy is to try him on FOLFIRINOX.  This is a very active regimen for pancreatic cancer.  I think  that he would be able to tolerate this.  The one issue with FOLFIRINOX is that it does produce significant myelo suppression.  As such, anticoagulation would be very "tricky" with this protocol.  I think that if we do do systemic chemotherapy, he probably will have to be over at [facility] for this.  His blood sugars are poorly controlled.  He really needs to be on some better diabetic program for his blood sugars.  Again, I am just pleased that he is looking better than when I saw him on Monday.  He is out of bed and getting around a little bit better.  He does have the pain and neuropathy with his right foot.  I suspect this will always be present.  I will see about having the Port-A-Cath placed on Monday.  Maybe, he could be moved over to [facility] next week for the initiation of chemotherapy.  I appreciate the great care that he is getting from all the staff on 5 W.  Ryan Haw, MD  Ryan Medina

## 2018-09-25 LAB — CBC
HCT: 30.6 % — ABNORMAL LOW (ref 39.0–52.0)
HCT: 29.8 % — ABNORMAL LOW (ref 39.0–52.0)
Hemoglobin: 10.2 g/dL — ABNORMAL LOW (ref 13.0–17.0)
Hemoglobin: 9.8 g/dL — ABNORMAL LOW (ref 13.0–17.0)
MCH: 28.1 pg (ref 26.0–34.0)
MCH: 27.5 pg (ref 26.0–34.0)
MCHC: 33.3 g/dL (ref 30.0–36.0)
MCHC: 32.9 g/dL (ref 30.0–36.0)
MCV: 84.3 fL (ref 80.0–100.0)
MCV: 83.5 fL (ref 80.0–100.0)
Platelets: 519 10*3/uL — ABNORMAL HIGH (ref 150–400)
Platelets: 476 10*3/uL — ABNORMAL HIGH (ref 150–400)
RBC: 3.63 MIL/uL — ABNORMAL LOW (ref 4.22–5.81)
RBC: 3.57 MIL/uL — ABNORMAL LOW (ref 4.22–5.81)
RDW: 13.8 % (ref 11.5–15.5)
RDW: 13.7 % (ref 11.5–15.5)
WBC: 9.9 10*3/uL (ref 4.0–10.5)
WBC: 10.6 10*3/uL — AB (ref 4.0–10.5)
nRBC: 0 % (ref 0.0–0.2)
nRBC: 0 % (ref 0.0–0.2)

## 2018-09-25 LAB — GLUCOSE, CAPILLARY
GLUCOSE-CAPILLARY: 62 mg/dL — AB (ref 70–99)
Glucose-Capillary: 203 mg/dL — ABNORMAL HIGH (ref 70–99)
Glucose-Capillary: 104 mg/dL — ABNORMAL HIGH (ref 70–99)
Glucose-Capillary: 77 mg/dL (ref 70–99)
Glucose-Capillary: 57 mg/dL — ABNORMAL LOW (ref 70–99)

## 2018-09-25 LAB — COMPREHENSIVE METABOLIC PANEL
ALT: 19 U/L (ref 0–44)
AST: 15 U/L (ref 15–41)
Albumin: 1.8 g/dL — ABNORMAL LOW (ref 3.5–5.0)
Alkaline Phosphatase: 63 U/L (ref 38–126)
Anion gap: 10 (ref 5–15)
BUN: 14 mg/dL (ref 6–20)
CO2: 20 mmol/L — ABNORMAL LOW (ref 22–32)
Calcium: 8.1 mg/dL — ABNORMAL LOW (ref 8.9–10.3)
Chloride: 103 mmol/L (ref 98–111)
Creatinine, Ser: 0.59 mg/dL — ABNORMAL LOW (ref 0.61–1.24)
GFR calc Af Amer: >60 mL/min (ref >60)
GFR calc non Af Amer: >60 mL/min (ref >60)
Glucose, Bld: 69 mg/dL — ABNORMAL LOW (ref 70–99)
Potassium: 3.9 mmol/L (ref 3.5–5.1)
Sodium: 133 mmol/L — ABNORMAL LOW (ref 135–145)
TOTAL PROTEIN: 5.9 g/dL — AB (ref 6.5–8.1)
Total Bilirubin: 0.4 mg/dL (ref 0.3–1.2)

## 2018-09-25 LAB — BASIC METABOLIC PANEL
Anion gap: 11 (ref 5–15)
BUN: 11 mg/dL (ref 6–20)
CO2: 25 mmol/L (ref 22–32)
Calcium: 8.3 mg/dL — ABNORMAL LOW (ref 8.9–10.3)
Chloride: 96 mmol/L — ABNORMAL LOW (ref 98–111)
Creatinine, Ser: 0.52 mg/dL — ABNORMAL LOW (ref 0.61–1.24)
GFR calc Af Amer: >60 mL/min (ref >60)
GFR calc non Af Amer: >60 mL/min (ref >60)
Glucose, Bld: 211 mg/dL — ABNORMAL HIGH (ref 70–99)
Potassium: 3.4 mmol/L — ABNORMAL LOW (ref 3.5–5.1)
Sodium: 132 mmol/L — ABNORMAL LOW (ref 135–145)

## 2018-09-25 LAB — TROPONIN I: Troponin I: <0.03 ng/mL (ref <0.03)

## 2018-09-25 MED ORDER — SODIUM CHLORIDE 0.9 % IV SOLN
80.0000 mg | Freq: Once | INTRAVENOUS | Status: AC
  Administered 2018-09-25: 80 mg via INTRAVENOUS
  Filled 2018-09-25: qty 80

## 2018-09-25 MED ORDER — CEFAZOLIN SODIUM-DEXTROSE 2-4 GM/100ML-% IV SOLN
2.0000 g | INTRAVENOUS | Status: AC
  Filled 2018-09-25: qty 100

## 2018-09-25 MED ORDER — PANTOPRAZOLE SODIUM 40 MG IV SOLR: 40.0000 mg | Freq: Two times a day (BID) | INTRAVENOUS | Status: DC

## 2018-09-25 MED ORDER — SODIUM CHLORIDE 0.9 % IV SOLN
50.0000 ug/h | INTRAVENOUS | Status: AC
  Administered 2018-09-25 (×2): 50 ug/h via INTRAVENOUS
  Filled 2018-09-25 (×7): qty 1

## 2018-09-25 MED ORDER — SODIUM CHLORIDE 0.9 % IV SOLN
8.0000 mg/h | INTRAVENOUS | Status: DC
  Administered 2018-09-25 – 2018-09-26 (×3): 8 mg/h via INTRAVENOUS
  Filled 2018-09-25 (×5): qty 80

## 2018-09-25 NOTE — Consult Note (Signed)
Referring Physician(s): Dr Pearletha Alfred  Supervising Physician: Aletta Edouard  Patient Status:  [facility] - In-pt  Chief Complaint:  Panc Cancer   Subjective:  Dr Marin Olp requesting Port A Cath placement while inpt Scheduled for 1/6 in IR   Allergies: Lisinopril  Medications: Prior to Admission medications   Medication Sig Start Date End Date Taking? Authorizing Provider  amLODipine (NORVASC) 10 MG tablet Take 1 tablet (10 mg total) by mouth daily. 08/25/18  Yes Ghimire, Henreitta Leber, MD  atorvastatin (LIPITOR) 20 MG tablet Take 1 tablet (20 mg total) by mouth daily. 08/25/18  Yes Ghimire, Henreitta Leber, MD  cyclobenzaprine (FLEXERIL) 10 MG tablet Take 1 tablet (10 mg total) by mouth 2 (two) times daily as needed for muscle spasms. 08/25/18  Yes Ghimire, Henreitta Leber, MD  gabapentin (NEURONTIN) 300 MG capsule Take 1 capsule (300 mg total) by mouth 3 (three) times daily. 08/25/18  Yes Ghimire, Henreitta Leber, MD  insulin aspart (NOVOLOG) 100 UNIT/ML injection Inject 0-15 Units into the skin 3 (three) times daily with meals. 0-15 Units, Subcutaneous, 3 times daily with meals CBG < 70: implement hypoglycemia protocol CBG 70 - 120: 0 units CBG 121 - 150: 2 units CBG 151 - 200: 3 units CBG 201 - 250: 5 units CBG 251 - 300: 8 units CBG 301 - 350: 11 units CBG 351 - 400: 15 units CBG > 400: call MD 08/25/18  Yes Ghimire, Henreitta Leber, MD  insulin detemir (LEVEMIR) 100 UNIT/ML injection Inject 0.3 mLs (30 Units total) into the skin 2 (two) times daily. Patient taking differently: Inject 20-30 Units into the skin See admin instructions. Take 30 units in the morning, and 20 units in the evening 08/25/18  Yes Ghimire, Henreitta Leber, MD  metFORMIN (GLUCOPHAGE) 1000 MG tablet Take 1 tablet (1,000 mg total) by mouth 2 (two) times daily with a meal. 08/25/18  Yes Ghimire, Henreitta Leber, MD  nitroGLYCERIN (NITROSTAT) 0.4 MG SL tablet Place 1 tablet (0.4 mg total) under the tongue every 5 (five) minutes as needed for chest  pain. 08/25/18  Yes Ghimire, Henreitta Leber, MD  dicyclomine (BENTYL) 20 MG tablet Take 1 tablet (20 mg total) by mouth 2 (two) times daily. Patient not taking: Reported on 09/05/2018 08/25/18   Jonetta Osgood, MD  glucose blood (AGAMATRIX PRESTO TEST) test strip Check blood sugars prior to meals and at bedtime 08/25/18   Ghimire, Henreitta Leber, MD     Vital Signs: BP (!) 136/92 (BP Location: Left Arm)   Pulse 98   Temp 99.2 F (37.3 C) (Oral)   Resp 18   Ht 5\' 11"  (1.803 m)   Wt 147 lb 0.8 oz (66.7 kg)   SpO2 100%   BMI 20.51 kg/m   Physical Exam Vitals signs reviewed.  Cardiovascular:     Rate and Rhythm: Normal rate and regular rhythm.  Pulmonary:     Effort: Pulmonary effort is normal.     Breath sounds: Normal breath sounds.  Abdominal:     General: Bowel sounds are normal.     Palpations: Abdomen is soft.     Tenderness: There is abdominal tenderness.  Musculoskeletal: Normal range of motion.  Skin:    General: Skin is warm and dry.  Neurological:     General: No focal deficit present.     Mental Status: He is alert and oriented to person, place, and time.  Psychiatric:        Mood and Affect: Mood normal.  Behavior: Behavior normal.        Thought Content: Thought content normal.        Judgment: Judgment normal.     Imaging: US Renal  Result Date: 09/23/2018 CLINICAL DATA:  Acute renal failure. EXAM: RENAL / URINARY TRACT ULTRASOUND COMPLETE COMPARISON:  CT angiogram abdomen and pelvis 09/11/2018. FINDINGS: Right Kidney: Renal measurements: 11.8 x 6.3 x 7.4 cm = volume: 289.4 mL. Cortical echogenicity is increased. No mass or hydronephrosis visualized. Left Kidney: Renal measurements: 11.7 x 5.7 x 5.8 cm = volume: 204.1 mL. Cortical echogenicity is increased. 0.7 cm in diameter cyst incidentally noted. No solid mass or hydronephrosis visualized. Bladder: Appears normal for degree of bladder distention. IMPRESSION: Negative for hydronephrosis or acute abnormality.  Increased cortical echogenicity bilaterally is compatible with medical renal disease. Electronically Signed   By: Inge Rise M.D.   On: 09/23/2018 11:22    Labs:  CBC: Recent Labs    09/21/18 0350 09/22/18 0436 09/24/18 0746 09/25/18 0445  WBC 7.9 8.1 10.3 9.9  HGB 8.4* 10.1* 10.5* 10.2*  HCT 26.0* 30.7* 30.5* 30.6*  PLT 444* 469* 493* 519*    COAGS: Recent Labs    09/03/18 1325 09/07/18 1633 09/11/18 1624 09/12/18 0415  INR 1.03 1.28  --   --   APTT  --   --  30 30    BMP: Recent Labs    09/22/18 0436 09/23/18 0433 09/24/18 0746 09/25/18 0445  NA 136 133* 132* 132*  K 3.8 4.5 3.9 3.4*  CL 96* 100 94* 96*  CO2 31 21* 27 25  GLUCOSE 346* 251* 251* 211*  BUN 10 33* 6 11  CALCIUM 7.9* 8.2* 8.3* 8.3*  CREATININE 0.52* 1.86* 0.51* 0.52*  GFRNONAA >60 39* >60 >60  GFRAA >60 46* >60 >60    LIVER FUNCTION TESTS: Recent Labs    09/19/18 0356 09/20/18 0421 09/21/18 0350 09/22/18 0436  BILITOT 0.4 0.4 0.3 0.4  AST 16 14* 15 16  ALT 22 21 20 21   ALKPHOS 70 65 64 65  PROT 4.8* 4.8* 5.2* 5.3*  ALBUMIN 1.5* 1.6* 1.6* 1.6*    Assessment and Plan:  Newly diagnosed pancreatic cancer For PAC placement per Oncology Scheduled for 1/6 in IR Risks and benefits of image guided port-a-catheter placement was discussed with the patient including, but not limited to bleeding, infection, pneumothorax, or fibrin sheath development and need for additional procedures.  All of the patient's questions were answered, patient is agreeable to proceed. Consent signed and in chart.   Electronically Signed: Lavonia Drafts, PA-C 09/25/2018, 9:03 AM   I spent a total of 25 Minutes at the the patient's bedside AND on the patient's hospital floor or unit, greater than 50% of which was counseling/coordinating care for [facility] placement

## 2018-09-25 NOTE — Progress Notes (Signed)
PROGRESS NOTE  Ryan Medina FOY:[id number] DOB: 1961/04/09 DOA: 09/03/2018 PCP: Patient, No Pcp Per   LOS: 22 days   Brief Narrative / Interim history: Patient is a 58 y.o. male with history of cocaine use, chronic systolic heart failure presented to the hospital on 12/13 with a right ischemic leg secondary to acute popliteal and tibial embolus, underwent embolectomy on 12/13 and subsequently was placed on a heparin drip.  He unfortunately developed a acute right lower leg hematoma with compartment syndrome requiring fasciotomy on 12/15.  Further hospital course was complicated by development of hemorrhagic shock with acute blood loss anemia secondary to a bleeding duodenal ulcer-requiring ICU transfer-intubation for airway protection and IR evaluation with mesenteric angiogram and GDA embolization.  Upon stability he was transferred back to the triad hospitalist service on 12/19.  See below for further details.   waiting for SNF bed, may need to be transferred to [facility] long hospital for chemoRx)  Subjective: One episode of dark emesis, significant other at bedside, epigastric discomfort noted, No chest pains, no fevers Assessment & Plan: Principal Problem:   Popliteal artery embolus (HCC) Active Problems:   DM (diabetes mellitus), type 2, uncontrolled ([place])   Essential hypertension   Dilated cardiomyopathy ([place])   Chest pain   AAA (abdominal aortic aneurysm) ([place])   Ischemic foot   Right leg claudication ([place])   PVD (peripheral vascular disease) ([place])   Hemorrhagic shock ([place])   Nontraumatic ischemic infarction of muscle of right lower leg   Upper GI bleed   Cocaine abuse ([place])   Acute blood loss anemia   Chronic pain syndrome   Duodenal ulcer with hemorrhage   Pancreatic adenocarcinoma (HCC)   Goals of care, counseling/discussion   Principal Problem Right ischemic leg secondary to popliteal artery/tibial artery embolism-s/p embolectomy on 76/72 complicated by right lower  extremity hematoma with compartment syndrome requiring fasciotomy on 12/15:   Source of embolization unclear, required fasciotomy on 09/05/2018 by vascular surgery status post closure of fasciotomy on 09/10/2018.  His course was complicated by large hematoma in the right leg along with significant life-threatening GI bleed. Case discussed with vascular surgeon Dr. Monica Martinez who wants to continue anticoagulation as much as possible.   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  Cardiology was consulted for TEE verbally told no clots, EF 65%, negative for PFO.  Final TEE read pending  Active problems Upper GI bleeding with hemorrhagic shock and acute blood loss anemia: Developed hemorrhagic shock, was seen by GI, required multiple PRBC transfusions last transfusion on 09/09/2018.  Was seen by GI and eventually underwent gastroduodenal artery embolization on 09/07/2018.  Total 8 units of packed RBC, 1 unit of FFP and 1 unit of platelets transfused around 09/08/2018. GI note noted from 09/10/2018 (okay with heparin challenge).  He was rechallenged with heparin cautiously on 09/12/2018 unfortunately he bled again on 09/14/2018 after which heparin was stopped and he received another 3 units of packed RBCs.  So far he has received 11 units of packed RBCs.  He is getting 12th unit of packed RBC on 09/21/2018 but oncology.  His H&H appears stable.  He also underwent EUS on 09/16/2018 which showed +ve Duodenal Ulcer which was still raw--- patient had one episode of dark emesis on 09/25/2018 , IV Protonix and octreotide will be started , repeat hemoglobin stable at 9.8 , Hold Heparin indefinitely.    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  MRI noted & confirms mass, CA 19.9  was elevated, GI seeing and underwent EUS with biopsy on 09/16/2018, biopsy consistent with adenocarcinoma.    Per Dr. Marin Olp- may need to be transferred to [facility] long hospital for  chemoRx)  Hypokalemia and hypomagnesemia.  Replaced and now stable.    Urinary retention.  On Foley and Flomax.  Foley removed on 09/15/2018, stable emptying bladder.     Acute hypoxic respiratory failure: Intubated when he developed hemorrhagic shock secondary to GI bleeding, successfully extubated-currently stable on 2 L of oxygen via nasal cannula.  Chronic systolic heart failure (EF 45-50% by TTE on 09/04/2018): Reasonably well compensated-follow weights, volume status, electrolytes closely.  Cocaine use/tobacco use: Counseled about quitting.  3 cm AAA: Stable for follow-up in the outpatient setting  Acute kidney injury.  Developed on 09/23/2018.  No clear reason.  No offending medications or hypotension.  Received IV fluids, renal ultrasound normal, repeat renal function within normal limits on 1/3,?  Lab error on 1/2  Insulin-dependent DM-2: Last A1c on 12/2 was 18.2.  Labile sugars with episodes of hypoglycemia, c/n Lantus dose and sliding scale on 09/22/18.  pt is  noncompliant with diet, will monitor.   Scheduled Meds: . atorvastatin  20 mg Oral QHS  . chlorhexidine gluconate (MEDLINE KIT)  15 mL Mouth Rinse BID  . dronabinol  5 mg Oral BID AC  . insulin aspart  0-5 Units Subcutaneous QHS  . insulin aspart  0-9 Units Subcutaneous TID WC  . insulin glargine  9 Units Subcutaneous Daily  . lidocaine  15 mL Oral Once  . mouth rinse  15 mL Mouth Rinse BID  . [START ON 09/28/2018] pantoprazole  40 mg Intravenous Q12H  . tamsulosin  0.4 mg Oral QHS   Continuous Infusions: . sodium chloride    . [START ON 09/27/2018]  ceFAZolin (ANCEF) IV    . octreotide  (SANDOSTATIN)    IV infusion 50 mcg/hr (09/25/18 1111)  . pantoprozole (PROTONIX) infusion 8 mg/hr (09/25/18 1214)   PRN Meds:.sodium chloride, alum & mag hydroxide-simeth, ipratropium-albuterol, LORazepam, morphine injection, [DISCONTINUED] ondansetron **OR** ondansetron (ZOFRAN) IV, oxyCODONE, polyethylene glycol,  senna-docusate, sodium chloride flush, zolpidem  DVT prophylaxis: SCDs Code Status: Partial code Family Communication: Family at bedside Disposition Plan: SNF when bed available.  Difficult placement.  See SW note (may need to be transferred to [facility] long hospital for chemoRx)    Procedures:   2D echo:  12/17>>Mesenteric angiogram and coil embolization of the GDA  12/17>>EGD  12/15>> 1.Evacuation hematoma right leg 2.Evacuation of lymphocele right leg 3.4 compartment fasciotomy 4.Placement of VAC (medial and lateral)  12/13>> 1.Ultrasound-guided access to the left common femoral artery 2.Aortogram with bilateral iliac arteriogram 3.Selective catheterization of the right external iliac artery with right lower extremity runoff  MRI Abd.  Pancreatic mass.   TTE - Left ventricle: Wall thickness was increased in a pattern of mild LVH. Systolic function was mildly reduced. The estimated ejection fraction was in the range of 45% to 50%. Wall motion was normal; there were no regional wall motion abnormalities. Dopplerparameters are consistent with abnormal left ventricular relaxation (grade 1 diastolic dysfunction). - Aortic valve: There was trivial regurgitation. - Mitral valve: Mildly calcified annulus. There was mild regurgitation. - Right atrium: Central venous pressure (est): 3 mm Hg. - Tricuspid valve: There was mild regurgitation. - Pulmonary arteries: Systolic pressure could not be accurately estimated. - Pericardium, extracardiac: There was no pericardial effusion.  Antimicrobials:  None    Objective: Vitals:   09/24/18 2122 09/25/18 0515 09/25/18 7793  09/25/18 1427  BP: (!) 120/94 (!) 136/92 (!) 127/98 (!) 126/99  Pulse: 99 98 (!) 118 (!) 111  Resp: 20 18 (!) 22 (!) 22  Temp: 99.5 F (37.5 C) 99.2 F (37.3 C) 98.4 F (36.9 C) 99.8 F (37.7 C)  TempSrc: Oral Oral Oral Oral  SpO2: 99% 100% 97% 100%  Weight:      Height:         Intake/Output Summary (Last 24 hours) at 09/25/2018 1803 Last data filed at 09/25/2018 1500 Gross per 24 hour  Intake 320 ml  Output 250 ml  Net 70 ml   Filed Weights   09/03/18 1650 09/16/18 1325  Weight: 66.7 kg 66.7 kg    Examination:   Physical Exam  Patient is examined daily including today on 09/25/2018  , exams remain the same as of yesterday except that has changed   Gen:- Awake Alert,  In no apparent distress  HEENT:- [place].AT, No sclera icterus Neck-Supple Neck,No JVD,.  Lungs-fair air movement bilaterally, no wheezing CV- S1, S2 normal Abd-  +ve B.Sounds, Abd Soft, mild epigastric tenderness, no rebound or guarding    Extremity/Skin:- 1 +  edema,   Surgical staples right lower extremity medial and lateral aspects Psych-affect is appropriate, oriented x3 Neuro-no new focal deficits, no tremors     CBC: Recent Labs  Lab 09/21/18 0350 09/22/18 0436 09/24/18 0746 09/25/18 0445 09/25/18 1237  WBC 7.9 8.1 10.3 9.9 10.6*  HGB 8.4* 10.1* 10.5* 10.2* 9.8*  HCT 26.0* 30.7* 30.5* 30.6* 29.8*  MCV 85.5 83.7 83.3 84.3 83.5  PLT 444* 469* 493* 519* 182*   Basic Metabolic Panel: Recent Labs  Lab 09/20/18 0421 09/21/18 0350 09/22/18 0436 09/23/18 0433 09/24/18 0746 09/25/18 0445  NA 132* 136 136 133* 132* 132*  K 3.8 4.2 3.8 4.5 3.9 3.4*  CL 96* 100 96* 100 94* 96*  CO2 28 28 31  21* 27 25  GLUCOSE 264* 262* 346* 251* 251* 211*  BUN 10 10 10  33* 6 11  CREATININE 0.61 0.48* 0.52* 1.86* 0.51* 0.52*  CALCIUM 7.6* 7.8* 7.9* 8.2* 8.3* 8.3*  MG 1.9  --   --  1.6*  --   --    GFR: Estimated Creatinine Clearance: 96.1 mL/min (A) (by C-G formula based on SCr of 0.52 mg/dL (L)). Liver Function Tests: Recent Labs  Lab 09/19/18 0356 09/20/18 0421 09/21/18 0350 09/22/18 0436  AST 16 14* 15 16  ALT 22 21 20 21   ALKPHOS 70 65 64 65  BILITOT 0.4 0.4 0.3 0.4  PROT 4.8* 4.8* 5.2* 5.3*  ALBUMIN 1.5* 1.6* 1.6* 1.6*   CBG: Recent Labs  Lab 09/24/18 1719  09/24/18 2120 09/25/18 0742 09/25/18 1210 09/25/18 1752  GLUCAP 173* 154* 203* 104* 77   Urine analysis:    Component Value Date/Time   COLORURINE YELLOW 09/18/2018 1152   APPEARANCEUR HAZY (A) 09/18/2018 1152   LABSPEC 1.011 09/18/2018 1152   PHURINE 6.0 09/18/2018 1152   GLUCOSEU NEGATIVE 09/18/2018 1152   HGBUR NEGATIVE 09/18/2018 1152   BILIRUBINUR NEGATIVE 09/18/2018 1152   KETONESUR NEGATIVE 09/18/2018 1152   PROTEINUR NEGATIVE 09/18/2018 1152   UROBILINOGEN 4.0 (H) 12/05/2016 0921   NITRITE NEGATIVE 09/18/2018 1152   LEUKOCYTESUR SMALL (A) 09/18/2018 [place], MD [facility] If 7PM-7AM, please contact night-coverage [email or web address] Password [facility] 09/25/2018, 6:03 PM

## 2018-09-26 LAB — CBC
HCT: 24.9 % — ABNORMAL LOW (ref 39.0–52.0)
HEMATOCRIT: 30.1 % — AB (ref 39.0–52.0)
Hemoglobin: 9.8 g/dL — ABNORMAL LOW (ref 13.0–17.0)
Hemoglobin: 8 g/dL — ABNORMAL LOW (ref 13.0–17.0)
MCH: 28.3 pg (ref 26.0–34.0)
MCH: 28.3 pg (ref 26.0–34.0)
MCHC: 32.6 g/dL (ref 30.0–36.0)
MCHC: 32.1 g/dL (ref 30.0–36.0)
MCV: 87 fL (ref 80.0–100.0)
MCV: 88 fL (ref 80.0–100.0)
Platelets: 514 10*3/uL — ABNORMAL HIGH (ref 150–400)
Platelets: 331 10*3/uL (ref 150–400)
RBC: 3.46 MIL/uL — ABNORMAL LOW (ref 4.22–5.81)
RBC: 2.83 MIL/uL — ABNORMAL LOW (ref 4.22–5.81)
RDW: 14.1 % (ref 11.5–15.5)
RDW: 14.1 % (ref 11.5–15.5)
WBC: 17 10*3/uL — ABNORMAL HIGH (ref 4.0–10.5)
WBC: 21.8 10*3/uL — ABNORMAL HIGH (ref 4.0–10.5)
nRBC: 0 % (ref 0.0–0.2)
nRBC: 0 % (ref 0.0–0.2)

## 2018-09-26 LAB — GLUCOSE, CAPILLARY
Glucose-Capillary: 58 mg/dL — ABNORMAL LOW (ref 70–99)
Glucose-Capillary: 56 mg/dL — ABNORMAL LOW (ref 70–99)
Glucose-Capillary: 55 mg/dL — ABNORMAL LOW (ref 70–99)
Glucose-Capillary: 73 mg/dL (ref 70–99)
Glucose-Capillary: 131 mg/dL — ABNORMAL HIGH (ref 70–99)
Glucose-Capillary: 288 mg/dL — ABNORMAL HIGH (ref 70–99)

## 2018-09-26 LAB — COMPREHENSIVE METABOLIC PANEL
ALT: 20 U/L (ref 0–44)
AST: 15 U/L (ref 15–41)
Albumin: 1.8 g/dL — ABNORMAL LOW (ref 3.5–5.0)
Alkaline Phosphatase: 68 U/L (ref 38–126)
Anion gap: 9 (ref 5–15)
BUN: 14 mg/dL (ref 6–20)
CALCIUM: 8.2 mg/dL — AB (ref 8.9–10.3)
CO2: 25 mmol/L (ref 22–32)
Chloride: 100 mmol/L (ref 98–111)
Creatinine, Ser: 0.67 mg/dL (ref 0.61–1.24)
GFR calc Af Amer: >60 mL/min (ref >60)
GFR calc non Af Amer: >60 mL/min (ref >60)
Glucose, Bld: 50 mg/dL — ABNORMAL LOW (ref 70–99)
Potassium: 4 mmol/L (ref 3.5–5.1)
Sodium: 134 mmol/L — ABNORMAL LOW (ref 135–145)
Total Bilirubin: 0.5 mg/dL (ref 0.3–1.2)
Total Protein: 5.9 g/dL — ABNORMAL LOW (ref 6.5–8.1)

## 2018-09-26 LAB — PREPARE RBC (CROSSMATCH)

## 2018-09-26 LAB — MAGNESIUM: Magnesium: 1.7 mg/dL (ref 1.7–2.4)

## 2018-09-26 MED ORDER — MAGNESIUM SULFATE 2 GM/50ML IV SOLN
2.0000 g | Freq: Once | INTRAVENOUS | Status: AC
  Administered 2018-09-26: 2 g via INTRAVENOUS
  Filled 2018-09-26: qty 50

## 2018-09-26 MED ORDER — INSULIN GLARGINE 100 UNIT/ML ~~LOC~~ SOLN
6.0000 [IU] | Freq: Every day | SUBCUTANEOUS | Status: DC
  Administered 2018-09-26: 6 [IU] via SUBCUTANEOUS
  Filled 2018-09-26 (×3): qty 0.06

## 2018-09-26 MED ORDER — SODIUM CHLORIDE 0.9 % IV SOLN
8.0000 mg/h | INTRAVENOUS | Status: DC
  Administered 2018-09-26: 8 mg/h via INTRAVENOUS
  Filled 2018-09-26 (×2): qty 80

## 2018-09-26 MED ORDER — SODIUM CHLORIDE 0.9 % IV BOLUS
500.0000 mL | Freq: Once | INTRAVENOUS | Status: AC
  Administered 2018-09-26: 500 mL via INTRAVENOUS

## 2018-09-26 MED ORDER — FUROSEMIDE 10 MG/ML IJ SOLN
20.0000 mg | Freq: Once | INTRAMUSCULAR | Status: AC
  Administered 2018-09-26: 20 mg via INTRAVENOUS
  Filled 2018-09-26: qty 2

## 2018-09-26 MED ORDER — SODIUM CHLORIDE 0.9 % IV SOLN
8.0000 mg/h | INTRAVENOUS | Status: AC
  Administered 2018-09-27 (×2): 8 mg/h via INTRAVENOUS
  Filled 2018-09-26 (×3): qty 80

## 2018-09-26 MED ORDER — PANTOPRAZOLE SODIUM 40 MG PO TBEC
40.0000 mg | DELAYED_RELEASE_TABLET | Freq: Two times a day (BID) | ORAL | Status: DC
  Administered 2018-09-27 – 2018-10-29 (×55): 40 mg via ORAL
  Filled 2018-09-26 (×61): qty 1

## 2018-09-26 MED ORDER — SODIUM CHLORIDE 0.9% IV SOLUTION
Freq: Once | INTRAVENOUS | Status: AC
  Administered 2018-09-26: 21:00:00 via INTRAVENOUS

## 2018-09-26 MED ORDER — PANTOPRAZOLE SODIUM 40 MG PO TBEC: 40.0000 mg | DELAYED_RELEASE_TABLET | Freq: Two times a day (BID) | ORAL | Status: DC

## 2018-09-26 NOTE — Progress Notes (Signed)
Call 13M for report, Pt was transfer to unit after giving report to nurse.

## 2018-09-26 NOTE — Progress Notes (Signed)
Hypoglycemic Event  CBG: 58 at 0806  Treatment: 16 oz Orange Juice and Graham crackers  Symptoms: None  Follow-up CBG: Time: 0845 CBG Result: 85  Comments/MD notified: MD notified, no orders given at this time. Will continue to monitor.    Ryan Medina

## 2018-09-26 NOTE — Progress Notes (Signed)
   Discussed with on-call GI physician Dr. Karl Bales states that patient's GI bleed is from invading pancreatic mass and not amenable to endoscopic correction-----we will transfuse, and stabilize patient, transfer to ICU, may require interventional radiology input for possible embolization  See full progress note from earlier in the day PCCM attending paged  Roxan Hockey, MD

## 2018-09-26 NOTE — Progress Notes (Signed)
Brief GI Progress Note  Received a call from [facility] answering service at 730 about a stat consult Called back at 734 and spoke with Dr. Denton Brick.  Patient had episode of coffee-ground emesis on Saturday into Sunday.  Hemoglobin was trended and was noted to be stable in the nines. Saturday to Sunday he was started back on IV Protonix and on octreotide. GI was not reconsulted or reengaged on 09/26/2018, today (Sunday). Per report of primary physician describes hypotension occurring around 5:30 PM. Per documentation in the notes at 5:45 PM nurse noted that systolic blood pressures were in the 60s and normal saline bolus was performed as well as blood count. There is notation that the patient had some recurrent dark vomit and bloody poop start back during the course of today. The hemogram had returned down into the 8 range. There is plan for transfusion.  Speaking with Dr. Denton Brick and reviewing the case, I initially did his first endoscopy which showed a large cratered duodenal ulcer without endoscopic therapies that would be helpful and hemo-spray was performed in an effort of getting the patient to an empiric GDA embolization after he had been stabilized with blood products. Subsequently my partner, Dr. Ardis Hughs, performed an endoscopic ultrasound and at that time he was actively bleeding from the same ulcer even after his GDA embolization.  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 Unfortunately, there are no good endoscopic therapies that will be lasting in effect for this patient since we have already seen him have persistent bleeding after Hemospray and with an empiric GDA embolization. I see that there was a radiation oncology consultation but I cannot see final recommendations.  But I wonder, about the possibility of considering radiation therapy. I agree with  continuing IV PPI and not unreasonable to continue octreotide though no varices but it has been used in the past for GI bleeding and carries minimal side effects.. I think the next step for this patient is stabilization off the floor and in the Intensive Care Unit. Although an EGD could be considered, there is no therapy endoscopically that will allow for cessation of bleeding as a result of invasive cancer bleeding. He needs pRBC transfusion, ICU stabilization and then discussion with Interventional Radiology for Mesenteric Arteriogram evaluation. I think, repeat GDA embolization would be risky but it is in the region.  I think the possibility of PDA embolization, knowing the invasiveness of the cancer at this point, may the route to consider, but obviously defer this discussion ot IR. I am happy to discuss the case further with the ICU and/or with IR they feel necessary, but reading through the chart and understanding his history, I think, it is reasonable to have them as the next step in his evaluation.  I will place the patient back on the GI inpatient list for follow up formally in the AM. Please feel free to reach out to me in regards to other changes from a clinical status and I can discuss things further overnight if necessary.  Justice Britain, MD [facility] Office # [phone]

## 2018-09-26 NOTE — Significant Event (Signed)
Rapid Response Event Note  I received a call from patient placement about patient needing an ICU bed. Patient has been hypotension, + bloody vomit/stools today per chart. 500 cc NS bolus was given, blood products have been ordered, patient is on Protonix and Ocreotide Infusions as well. Current SBP 70-80s. Patient is alert, disoriented at times, HR in the 110-120 range, 100% on RA (placed on 2L for comfort for transport). Mild shortness of breath as well, patient endorses back pain and abdominal cramping. I assisted in transferring the patient to 3M10 (MICU).   Start Time 2000 End Time 2100   Apryl Brymer R

## 2018-09-26 NOTE — Progress Notes (Signed)
Hypoglycemic Event  CBG: 55  Treatment: 8 oz juice/soda  Symptoms: None  Follow-up CBG: Time:1310 CBG Result:73  Possible Reasons for Event: Inadequate meal intake   Will continue to monitor    Salley Slaughter

## 2018-09-26 NOTE — Progress Notes (Signed)
PROGRESS NOTE  Ryan Medina DDU:[id number] DOB: 19-Oct-1960 DOA: 09/03/2018 PCP: Patient, No Pcp Per   LOS: 23 days   Brief Narrative / Interim history: Patient is a 58 y.o. male with history of cocaine use, chronic systolic heart failure presented to the hospital on 12/13 with a right ischemic leg secondary to acute popliteal and tibial embolus, underwent embolectomy on 12/13 and subsequently was placed on a heparin drip.  He unfortunately developed a acute right lower leg hematoma with compartment syndrome requiring fasciotomy on 12/15.  Further hospital course was complicated by development of hemorrhagic shock with acute blood loss anemia secondary to a bleeding duodenal ulcer-requiring ICU transfer-intubation for airway protection and IR evaluation with mesenteric angiogram and GDA embolization.  Upon stability he was transferred back to the [facility] hospital[facility] service on 12/19.  See below for further details.   waiting for SNF bed, may need to be transferred to [facility] long hospital for chemoRx)  Subjective: NB!!! Patient had another episode of dark emesis on 09/26/2018 , hemoglobin on 09/25/2018 was 9.8, repeat hemoglobin in a.m. of 09/26/2017 was 9.8 however after episode of dark emesis, repeat hemoglobin is down to 8.0, patient developed hypotension with systolic blood pressure in the 60s, gave IV fluids, will transfuse 2 units of packed red blood cells, trying to reach Dr. Rush Landmark who is a GI physician on call for [facility], c/n  IV Protonix and iv octreotide,    Assessment & Plan: Principal Problem:   Popliteal artery embolus (HCC) Active Problems:   DM (diabetes mellitus), type 2, uncontrolled ([place])   Essential hypertension   Dilated cardiomyopathy ([place])   Chest pain   AAA (abdominal aortic aneurysm) ([place])   Ischemic foot   Right leg claudication ([place])   PVD (peripheral vascular disease) ([place])   Hemorrhagic shock ([place])   Nontraumatic ischemic infarction of muscle of right lower  leg   Upper GI bleed   Cocaine abuse ([place])   Acute blood loss anemia   Chronic pain syndrome   Duodenal ulcer with hemorrhage   Pancreatic adenocarcinoma ([place])   Goals of care, counseling/discussion   Principal Problem Right ischemic leg secondary to popliteal artery/tibial artery embolism-s/p embolectomy on 23/76 complicated by right lower extremity hematoma with compartment syndrome requiring fasciotomy on 12/15:   Source of embolization unclear, required fasciotomy on 09/05/2018 by vascular surgery status post closure of fasciotomy on 09/10/2018.  His course was complicated by large hematoma in the right leg along with significant life-threatening GI bleed. Case discussed with vascular surgeon Dr. Monica Martinez who wants to continue anticoagulation as much as possible.   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  Cardiology was consulted for TEE verbally told no clots, EF 65%, negative for PFO.  Final TEE read pending  Active problems Upper GI bleeding with hemorrhagic shock and acute blood loss anemia: Developed hemorrhagic shock, was seen by GI, required multiple PRBC transfusions last transfusion on 09/09/2018.  Was seen by GI and eventually underwent gastroduodenal artery embolization on 09/07/2018.  Total 8 units of packed RBC, 1 unit of FFP and 1 unit of platelets transfused around 09/08/2018. GI note noted from 09/10/2018 (okay with heparin challenge).  He was rechallenged with heparin cautiously on 09/12/2018 unfortunately he bled again on 09/14/2018 after which heparin was stopped and he received another 3 units of packed RBCs.  So far he has received 11 units of packed RBCs.  He is getting 12th unit of packed RBC on 09/21/2018 but  oncology.  His H&H appears stable.  He also underwent EUS on 09/16/2018 which showed +ve Duodenal Ulcer which was still raw---  NB!!! Patient had another episode of dark emesis on 09/26/2018 , hemoglobin on 09/25/2018 was  9.8, repeat hemoglobin in a.m. of 09/26/2017 was 9.8 however after episode of dark emesis, repeat hemoglobin is down to 8.0, patient developed hypotension with systolic blood pressure in the 60s, gave IV fluids, will transfuse 2 units of packed red blood cells, trying to reach Dr. Rush Landmark who is a GI physician on call for [facility], c/n  IV Protonix and iv octreotide, , Hold Heparin indefinitely.    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  MRI noted & confirms mass, CA 19.9 was elevated, GI seeing and underwent EUS with biopsy on 09/16/2018, biopsy consistent with adenocarcinoma.    Per Dr. Marin Olp- may need to be transferred to [facility] long hospital for chemoRx)  Hypokalemia and hypomagnesemia.  Replaced and now stable.    Urinary retention.  On Foley and Flomax.  Foley removed on 09/15/2018, stable , voiding okay  Acute hypoxic respiratory failure: Intubated when he developed hemorrhagic shock secondary to GI bleeding, successfully extubated-hypoxia actually resolved  Chronic systolic heart failure (EF 45-50% by TTE on 09/04/2018): Reasonably well compensated-follow weights, volume status, electrolytes closely.  Cocaine use/tobacco use: Counseled about quitting.  3 cm AAA: Stable for follow-up in the outpatient setting  Acute kidney injury.  Developed on 09/23/2018.  No clear reason.  No offending medications or hypotension.  Received IV fluids, renal ultrasound normal, repeat renal function within normal limits on 1/3,?  Lab error on 1/2  Insulin-dependent DM-2: Last A1c on 08/23/18 was 18.2.  Labile sugars with episodes of hypoglycemia, hold Lantus dose, use sliding scale on 09/22/18.  pt is  noncompliant with diet, will monitor.   Scheduled Meds: . sodium chloride   Intravenous Once  . atorvastatin  20 mg Oral QHS  . chlorhexidine gluconate (MEDLINE KIT)  15 mL Mouth Rinse BID  . dronabinol  5 mg Oral BID AC  . furosemide  20 mg Intravenous Once  . insulin aspart   0-5 Units Subcutaneous QHS  . insulin aspart  0-9 Units Subcutaneous TID WC  . insulin glargine  6 Units Subcutaneous QHS  . lidocaine  15 mL Oral Once  . mouth rinse  15 mL Mouth Rinse BID  . [START ON 09/27/2018] pantoprazole  40 mg Oral BID AC  . tamsulosin  0.4 mg Oral QHS   Continuous Infusions: . [START ON 09/27/2018]  ceFAZolin (ANCEF) IV    . octreotide  (SANDOSTATIN)    IV infusion 50 mcg/hr (09/26/18 1000)  . pantoprozole (PROTONIX) infusion     PRN Meds:.alum & mag hydroxide-simeth, ipratropium-albuterol, LORazepam, morphine injection, [DISCONTINUED] ondansetron **OR** ondansetron (ZOFRAN) IV, oxyCODONE, polyethylene glycol, senna-docusate, sodium chloride flush, zolpidem  DVT prophylaxis: SCDs Code Status: Partial code Family Communication: Family at bedside Disposition Plan: SNF when bed available.  Difficult placement.  See SW note (may need to be transferred to [facility] long hospital for chemoRx)    Procedures:   2D echo:  12/17>>Mesenteric angiogram and coil embolization of the GDA  12/17>>EGD  12/15>> 1.Evacuation hematoma right leg 2.Evacuation of lymphocele right leg 3.4 compartment fasciotomy 4.Placement of VAC (medial and lateral)  12/13>> 1.Ultrasound-guided access to the left common femoral artery 2.Aortogram with bilateral iliac arteriogram 3.Selective catheterization of the right external iliac artery with right lower extremity runoff  MRI Abd.  Pancreatic mass.   TTE -  Left ventricle: Wall thickness was increased in a pattern of mild LVH. Systolic function was mildly reduced. The estimated ejection fraction was in the range of 45% to 50%. Wall motion was normal; there were no regional wall motion abnormalities. Dopplerparameters are consistent with abnormal left ventricular relaxation (grade 1 diastolic dysfunction). - Aortic valve: There was trivial regurgitation. - Mitral valve: Mildly calcified annulus. There was mild  regurgitation. - Right atrium: Central venous pressure (est): 3 mm Hg. - Tricuspid valve: There was mild regurgitation. - Pulmonary arteries: Systolic pressure could not be accurately estimated. - Pericardium, extracardiac: There was no pericardial effusion.  Antimicrobials:  None    Objective: Vitals:   09/26/18 0503 09/26/18 1745 09/26/18 1809 09/26/18 1923  BP: (!) 130/96 (!) 64/44 (!) 69/47 (!) 70/51  Pulse: (!) 114 (!) 129 (!) 116 (!) 121  Resp: 16 (!) 21  20  Temp: 98.9 F (37.2 C) 98.4 F (36.9 C)  98.4 F (36.9 C)  TempSrc: Oral Oral  Oral  SpO2: 99% 99%  100%  Weight:      Height:        Intake/Output Summary (Last 24 hours) at 09/26/2018 1926 Last data filed at 09/26/2018 1619 Gross per 24 hour  Intake 1027.56 ml  Output -  Net 1027.56 ml   Filed Weights   09/03/18 1650 09/16/18 1325  Weight: 66.7 kg 66.7 kg    Examination:   Physical Exam  Patient is examined daily including today on 09/26/2018  , exams remain the same as of yesterday except that has changed   Gen:- Awake Alert,  In no apparent distress  HEENT:- [place].AT, No sclera icterus Neck-Supple Neck,No JVD,.  Lungs-fair air movement bilaterally, no wheezing CV- S1, S2 normal Abd-  +ve B.Sounds, Abd Soft, mild epigastric tenderness, no rebound or guarding    Extremity/Skin:- 1 +  edema,   Surgical staples right lower extremity medial and lateral aspects Psych-affect is appropriate, oriented x3 Neuro-no new focal deficits, no tremors     CBC: Recent Labs  Lab 09/24/18 0746 09/25/18 0445 09/25/18 1237 09/26/18 0413 09/26/18 1815  WBC 10.3 9.9 10.6* 17.0* 21.8*  HGB 10.5* 10.2* 9.8* 9.8* 8.0*  HCT 30.5* 30.6* 29.8* 30.1* 24.9*  MCV 83.3 84.3 83.5 87.0 88.0  PLT 493* 519* 476* 514* 956   Basic Metabolic Panel: Recent Labs  Lab 09/20/18 0421  09/23/18 0433 09/24/18 0746 09/25/18 0445 09/25/18 2233 09/26/18 0413  NA 132*   < > 133* 132* 132* 133* 134*  K 3.8   < > 4.5 3.9 3.4* 3.9 4.0   CL 96*   < > 100 94* 96* 103 100  CO2 28   < > 21* 27 25 20* 25  GLUCOSE 264*   < > 251* 251* 211* 69* 50*  BUN 10   < > 33* _0 CREATININE 0.61   < > 1.86* 0.51* 0.52* 0.59* 0.67  CALCIUM 7.6*   < > 8.2* 8.3* 8.3* 8.1* 8.2*  MG 1.9  --  1.6*  --   --   --  1.7   < > = values in this interval not displayed.   GFR: Estimated Creatinine Clearance: 96.1 mL/min (by C-G formula based on SCr of 0.67 mg/dL). Liver Function Tests: Recent Labs  Lab 09/20/18 0421 09/21/18 0350 09/22/18 0436 09/25/18 2233 09/26/18 0413  AST 14* _1 ALT _2 ALKPHOS 65 64 65 63 68  BILITOT 0.4  0.3 0.4 0.4 0.5  PROT 4.8* 5.2* 5.3* 5.9* 5.9*  ALBUMIN 1.6* 1.6* 1.6* 1.8* 1.8*   CBG: Recent Labs  Lab 09/26/18 0806 09/26/18 0839 09/26/18 1216 09/26/18 1310 09/26/18 1636  GLUCAP 58* 56* 55* 73 131*   Urine analysis:    Component Value Date/Time   COLORURINE YELLOW 09/18/2018 1152   APPEARANCEUR HAZY (A) 09/18/2018 1152   LABSPEC 1.011 09/18/2018 1152   PHURINE 6.0 09/18/2018 1152   GLUCOSEU NEGATIVE 09/18/2018 1152   HGBUR NEGATIVE 09/18/2018 1152   BILIRUBINUR NEGATIVE 09/18/2018 1152   KETONESUR NEGATIVE 09/18/2018 1152   PROTEINUR NEGATIVE 09/18/2018 1152   UROBILINOGEN 4.0 (H) 12/05/2016 0921   NITRITE NEGATIVE 09/18/2018 1152   LEUKOCYTESUR SMALL (A) 09/18/2018 [place], MD [facility] If 7PM-7AM, please contact night-coverage [email or web address] Password [facility] 09/26/2018, 7:26 PM

## 2018-09-26 NOTE — Progress Notes (Signed)
RN called for pt with SBP 60's, a/o x3, asymptomatic. MD paged, new orders for 500 cc NS bolus and collect CBC to check Hgb. Pt states' "I had some dark colored vomit and bloody poop start back up today."   1900 Update Hgb 8.0 this evening from 9.8 this morning. New orders to transfuse PRBC's

## 2018-09-26 NOTE — Progress Notes (Signed)
Pt had an episode of vomiting dark brown emesis. Pt c/o of chest pain, notified on call and new order put in.

## 2018-09-27 ENCOUNTER — Inpatient Hospital Stay (HOSPITAL_COMMUNITY): Payer: Medicaid Other

## 2018-09-27 DIAGNOSIS — K264 Chronic or unspecified duodenal ulcer with hemorrhage: Secondary | ICD-10-CM

## 2018-09-27 DIAGNOSIS — K921 Melena: Secondary | ICD-10-CM

## 2018-09-27 DIAGNOSIS — K269 Duodenal ulcer, unspecified as acute or chronic, without hemorrhage or perforation: Secondary | ICD-10-CM

## 2018-09-27 LAB — POCT I-STAT 3, ART BLOOD GAS (G3+)
Acid-base deficit: 9 mmol/L — ABNORMAL HIGH (ref 0.0–2.0)
Bicarbonate: 15.3 mmol/L — ABNORMAL LOW (ref 20.0–28.0)
O2 SAT: 92 %
Patient temperature: 97.6
TCO2: 16 mmol/L — ABNORMAL LOW (ref 22–32)
pCO2 arterial: 25.2 mmHg — ABNORMAL LOW (ref 32.0–48.0)
pH, Arterial: 7.389 (ref 7.350–7.450)
pO2, Arterial: 62 mmHg — ABNORMAL LOW (ref 83.0–108.0)

## 2018-09-27 LAB — GLUCOSE, CAPILLARY
Glucose-Capillary: 119 mg/dL — ABNORMAL HIGH (ref 70–99)
Glucose-Capillary: 147 mg/dL — ABNORMAL HIGH (ref 70–99)
Glucose-Capillary: 183 mg/dL — ABNORMAL HIGH (ref 70–99)
Glucose-Capillary: 203 mg/dL — ABNORMAL HIGH (ref 70–99)
Glucose-Capillary: 338 mg/dL — ABNORMAL HIGH (ref 70–99)
Glucose-Capillary: 36 mg/dL — CL (ref 70–99)

## 2018-09-27 LAB — BASIC METABOLIC PANEL
Anion gap: 8 (ref 5–15)
BUN: 75 mg/dL — ABNORMAL HIGH (ref 6–20)
CO2: 18 mmol/L — ABNORMAL LOW (ref 22–32)
Calcium: 7.3 mg/dL — ABNORMAL LOW (ref 8.9–10.3)
Chloride: 107 mmol/L (ref 98–111)
Creatinine, Ser: 1.24 mg/dL (ref 0.61–1.24)
GFR calc Af Amer: >60 mL/min (ref >60)
GFR calc non Af Amer: >60 mL/min (ref >60)
GLUCOSE: 339 mg/dL — AB (ref 70–99)
Potassium: 4.7 mmol/L (ref 3.5–5.1)
Sodium: 133 mmol/L — ABNORMAL LOW (ref 135–145)

## 2018-09-27 LAB — CBC
HEMATOCRIT: 20.8 % — AB (ref 39.0–52.0)
Hemoglobin: 6.8 g/dL — CL (ref 13.0–17.0)
MCH: 28 pg (ref 26.0–34.0)
MCHC: 32.7 g/dL (ref 30.0–36.0)
MCV: 85.6 fL (ref 80.0–100.0)
Platelets: 309 10*3/uL (ref 150–400)
RBC: 2.43 MIL/uL — ABNORMAL LOW (ref 4.22–5.81)
RDW: 13.8 % (ref 11.5–15.5)
WBC: 16.3 10*3/uL — ABNORMAL HIGH (ref 4.0–10.5)
nRBC: 0 % (ref 0.0–0.2)

## 2018-09-27 LAB — HEMOGLOBIN AND HEMATOCRIT, BLOOD
HEMATOCRIT: 25.8 % — AB (ref 39.0–52.0)
Hemoglobin: 8.6 g/dL — ABNORMAL LOW (ref 13.0–17.0)

## 2018-09-27 LAB — PROTIME-INR
INR: 1.5
Prothrombin Time: 17.9 seconds — ABNORMAL HIGH (ref 11.4–15.2)

## 2018-09-27 LAB — PREPARE RBC (CROSSMATCH)

## 2018-09-27 MED ORDER — INSULIN ASPART 100 UNIT/ML ~~LOC~~ SOLN
0.0000 [IU] | SUBCUTANEOUS | Status: DC
  Administered 2018-09-27: 3 [IU] via SUBCUTANEOUS
  Administered 2018-09-27: 1 [IU] via SUBCUTANEOUS
  Administered 2018-09-27: 2 [IU] via SUBCUTANEOUS
  Administered 2018-09-28: 5 [IU] via SUBCUTANEOUS
  Administered 2018-09-28: 2 [IU] via SUBCUTANEOUS
  Administered 2018-09-28: 3 [IU] via SUBCUTANEOUS
  Administered 2018-09-29: 2 [IU] via SUBCUTANEOUS
  Administered 2018-09-29: 7 [IU] via SUBCUTANEOUS
  Administered 2018-09-29 (×2): 2 [IU] via SUBCUTANEOUS

## 2018-09-27 MED ORDER — DEXTROSE 50 % IV SOLN
INTRAVENOUS | Status: AC
  Administered 2018-09-27: 50 mL
  Filled 2018-09-27: qty 50

## 2018-09-27 MED ORDER — IOPAMIDOL (ISOVUE-370) INJECTION 76%
100.0000 mL | Freq: Once | INTRAVENOUS | Status: AC | PRN
  Administered 2018-09-27: 100 mL via INTRAVENOUS

## 2018-09-27 MED ORDER — LORAZEPAM 2 MG/ML IJ SOLN: 1.0000 mg | Freq: Once | INTRAMUSCULAR | Status: DC

## 2018-09-27 MED ORDER — SODIUM CHLORIDE 0.9% IV SOLUTION: Freq: Once | INTRAVENOUS | Status: DC

## 2018-09-27 MED ORDER — SODIUM CHLORIDE 0.9 % IV SOLN
50.0000 ug/h | INTRAVENOUS | Status: DC
  Administered 2018-09-27 – 2018-09-28 (×4): 50 ug/h via INTRAVENOUS
  Filled 2018-09-27 (×5): qty 1

## 2018-09-27 MED ORDER — PHENYLEPHRINE HCL-NACL 10-0.9 MG/250ML-% IV SOLN
0.0000 ug/min | INTRAVENOUS | Status: DC
  Administered 2018-09-27: 10 ug/min via INTRAVENOUS
  Filled 2018-09-27: qty 250

## 2018-09-27 MED ORDER — FUROSEMIDE 10 MG/ML IJ SOLN
20.0000 mg | Freq: Once | INTRAMUSCULAR | Status: DC
  Filled 2018-09-27: qty 2

## 2018-09-27 NOTE — Progress Notes (Signed)
OT Cancellation Note  Patient Details Name: Ryan Medina MRN: [id number] DOB: [birth year]   Cancelled Treatment:    Reason Eval/Treat Not Completed: Medical issues which prohibited therapy. Pt with Hgb 6.8, getting blood and per RN hopefully going to IR soon for procedure due to do low Hgb. Will check back at later date to see if able to see pt.   [name], OTR/L Acute Rehab Services Pager [phone] Office [phone]     Almon Register 09/27/2018, 1:04 PM

## 2018-09-27 NOTE — Progress Notes (Signed)
Per RN no response at this time for further orders.  Patient currently on 6 lpmNC. Spo2 100%. No distress noted.  Patient remains nauseated and vomiting.  BIPAP not placed at this time.

## 2018-09-27 NOTE — Progress Notes (Signed)
Paged by RN concerning low BP (70/50's). Pt has been hypotensive from acute blood loss since yesterday evening around 5pm.. Has received 2 units of PRBCs but little improvement noted. At approximately 0600, pt became tachycardiac (120's-130's) and more lethargic. Previously instructed by Dr Joesph Fillers to call IR for possible mesenteric arteriogram evaluation if pt's condition worsens. Dr Kathlene Cote consulted and pt will be seen this am.   Hypotension -Continue with PRBC -CBC stat -Neo if necessary -Consulted IR for further eval   Lovey Newcomer, NP [facility] [phone]-[phone] [phone]

## 2018-09-27 NOTE — Progress Notes (Signed)
Pt sats 84 on 2L of O2, pt laying on left side and down in the bed. Pt repositioned to back and sat straight up in bed. Sats still 84-86% pt oxygen increased to 5L sats 90-91%. Pt has been on 2L with sats in the high 90's the entire night until now.  RRT called to room. MD paged. Orders received for STAT CXR. Blood gas, BIPAP and 20 mg of Lasix. Pt received 20 mg of lasix at 2356 in between PRBCs as ordered but has had no urinary output. Additional 20 mg dose of Lasix not given due to hypotension.   Bladder scan obtained 537- orders received for foley catheter but pt refused. Pt eventually voided 225 in urinal.   Blood gas results show Ph- 7.38, CO2- 25.2 and O2- 62.0, RRT advised no need for BIPAP and suggested HFNC. MD paged, awaiting orders.   Will continue to monitor.

## 2018-09-27 NOTE — Consult Note (Addendum)
Chief Complaint: Patient was seen in consultation today for GI bleed.  Referring Physician(s): Denton Brick, [place]  Supervising Physician: Arne Cleveland  Patient Status: [facility] - In-pt  History of Present Illness: Ryan Medina is a 58 y.o. male with a past medical history of hypertension, hyperlipidemia, dilated cardiomyopathy, AAA, pancreatic cancer, diabetes mellitus, and chronic back pain. He presented to [facility] ED 09/03/2018 with complaint of right leg numbness. He was found to have a popliteal artery embolus and was admitted for management. He underwent a right popliteal and tibial embolectomy with Bovine pericardial patch angioplasty of popliteal and tibial arteries in the OR 09/03/2018 by Dr. Scot Dock. During hospitalization, he experienced coffee-ground emesis and melenic/bloody stool along with secondary anemia and hypotension. He underwent an EGD 09/07/2018 by Dr. Rush Landmark which revealed a GI bleed. He then underwent an image-guided mesenteric angiogram with coil embolization of his GDA 09/07/2018 by Dr. Laurence Ferrari. He remained admitted and was stable until he experienced an episode of coffee-ground emesis on 09/25/2018 to 09/26/2018. In addition, his hemoglobin has been trending down.  IR requested by Dr. Denton Brick for possible image-guided mesenteric angiogram with possible embolization. Patient awake and alert laying in bed with no complaints at this time. Accompanied by wife and brother at bedside. Denies fever, chills, chest pain, dyspnea, abdominal pain, dizziness, or headache.   Past Medical History:  Diagnosis Date  . Back pain, chronic   . Diabetes ([place])   . Dilated cardiomyopathy ([place])    Echo 2/18: EF 45-50, diff HK, Gr 1 DD, trivial AI/TR  . Goals of care, counseling/discussion 09/24/2018  . HLD (hyperlipidemia)   . Hypertension   . IDDM (insulin dependent diabetes mellitus) ([place])   . Ruptured lumbar disc     Past Surgical History:  Procedure Laterality Date  .  ABDOMINAL AORTOGRAM W/LOWER EXTREMITY Right 09/03/2018   Procedure: ABDOMINAL AORTOGRAM W/LOWER EXTREMITY;  Surgeon: Angelia Mould, MD;  Location: [place] [facility];  Service: Cardiovascular;  Laterality: Right;  . ABDOMINAL SURGERY    . APPENDECTOMY    . APPLICATION OF WOUND VAC Right 09/05/2018   Procedure: APPLICATION OF WOUND VAC RIGHT LOWER MEDIAL AND LATERAL FASCIOTOMY SITE;  Surgeon: Angelia Mould, MD;  Location: [place];  Service: Vascular;  Laterality: Right;  . BACK SURGERY    . BIOPSY  09/16/2018   Procedure: BIOPSY;  Surgeon: Milus Banister, MD;  Location: [place];  Service: Endoscopy;;  . CARDIAC CATHETERIZATION N/A 05/19/2016   Procedure: Left Heart Cath and Coronary Angiography;  Surgeon: Leonie Man, MD;  Location: [place] [facility];  Service: Cardiovascular;  Laterality: N/A;  . EMBOLECTOMY Right 09/03/2018   Procedure: Embolectomy Right Popliteal and Tibial Artery, Bovine Pericardium Patch Angioplasty Right Popliteal Artery;  Surgeon: Angelia Mould, MD;  Location: [place];  Service: Vascular;  Laterality: Right;  . EMBOLECTOMY Right 09/10/2018   Procedure: EMBOLECTOMY RIGHT LOWER EXTREMITY;  Surgeon: Angelia Mould, MD;  Location: [facility] OR;  Service: Vascular;  Laterality: Right;  . ESOPHAGOGASTRODUODENOSCOPY N/A 09/07/2018   Procedure: ESOPHAGOGASTRODUODENOSCOPY (EGD);  Surgeon: Irving Copas., MD;  Location: [place];  Service: Gastroenterology;  Laterality: N/A;  . ESOPHAGOGASTRODUODENOSCOPY (EGD) WITH PROPOFOL N/A 09/16/2018   Procedure: ESOPHAGOGASTRODUODENOSCOPY (EGD) WITH PROPOFOL;  Surgeon: Milus Banister, MD;  Location: [facility] ENDOSCOPY;  Service: Endoscopy;  Laterality: N/A;  . EUS N/A 09/16/2018   Procedure: ESOPHAGEAL ENDOSCOPIC ULTRASOUND (EUS) RADIAL;  Surgeon: Milus Banister, MD;  Location: [facility] ENDOSCOPY;  Service: Endoscopy;  Laterality:  N/A;  . FASCIOTOMY Right 09/05/2018   Procedure: FOUR COMPARTMENT  FASCIOTOMY RIGHT LOWER LEG ;  Surgeon: Angelia Mould, MD;  Location: [place];  Service: Vascular;  Laterality: Right;  . FASCIOTOMY CLOSURE Right 09/10/2018   Procedure: FASCIOTOMY CLOSURE FOUR COMPARTMENT;  Surgeon: Angelia Mould, MD;  Location: [place];  Service: Vascular;  Laterality: Right;  . FINE NEEDLE ASPIRATION  09/16/2018   Procedure: FINE NEEDLE ASPIRATION (FNA) LINEAR;  Surgeon: Milus Banister, MD;  Location: [facility] ENDOSCOPY;  Service: Endoscopy;;  . HEMATOMA EVACUATION Right 09/05/2018   Procedure: EVACUATION HEMATOMA OF RIGHT LOWER EXTREMITY; EVACUATION OF LYMPHOCELE RIGHT LOWER LEG ;  Surgeon: Angelia Mould, MD;  Location: [place];  Service: Vascular;  Laterality: Right;  . IR ANGIOGRAM SELECTIVE EACH ADDITIONAL VESSEL  09/07/2018  . IR ANGIOGRAM SELECTIVE EACH ADDITIONAL VESSEL  09/07/2018  . IR ANGIOGRAM VISCERAL SELECTIVE  09/07/2018  . IR ANGIOGRAM VISCERAL SELECTIVE  09/07/2018  . IR EMBO ART  VEN HEMORR LYMPH EXTRAV  INC GUIDE ROADMAPPING  09/07/2018  . IR US GUIDE VASC ACCESS RIGHT  09/07/2018  . TEE WITHOUT CARDIOVERSION N/A 09/16/2018   Procedure: TRANSESOPHAGEAL ECHOCARDIOGRAM (TEE);  Surgeon: Milus Banister, MD;  Location: [facility] ENDOSCOPY;  Service: Endoscopy;  Laterality: N/A;  if cleared by GI    Allergies: Lisinopril  Medications: Prior to Admission medications   Medication Sig Start Date End Date Taking? Authorizing Provider  amLODipine (NORVASC) 10 MG tablet Take 1 tablet (10 mg total) by mouth daily. 08/25/18  Yes Ghimire, Henreitta Leber, MD  atorvastatin (LIPITOR) 20 MG tablet Take 1 tablet (20 mg total) by mouth daily. 08/25/18  Yes Ghimire, Henreitta Leber, MD  cyclobenzaprine (FLEXERIL) 10 MG tablet Take 1 tablet (10 mg total) by mouth 2 (two) times daily as needed for muscle spasms. 08/25/18  Yes Ghimire, Henreitta Leber, MD  gabapentin (NEURONTIN) 300 MG capsule Take 1 capsule (300 mg total) by mouth 3 (three) times daily. 08/25/18  Yes Ghimire, Henreitta Leber, MD  insulin aspart (NOVOLOG) 100 UNIT/ML injection Inject 0-15 Units into the skin 3 (three) times daily with meals. 0-15 Units, Subcutaneous, 3 times daily with meals CBG < 70: implement hypoglycemia protocol CBG 70 - 120: 0 units CBG 121 - 150: 2 units CBG 151 - 200: 3 units CBG 201 - 250: 5 units CBG 251 - 300: 8 units CBG 301 - 350: 11 units CBG 351 - 400: 15 units CBG > 400: call MD 08/25/18  Yes Ghimire, Henreitta Leber, MD  insulin detemir (LEVEMIR) 100 UNIT/ML injection Inject 0.3 mLs (30 Units total) into the skin 2 (two) times daily. Patient taking differently: Inject 20-30 Units into the skin See admin instructions. Take 30 units in the morning, and 20 units in the evening 08/25/18  Yes Ghimire, Henreitta Leber, MD  metFORMIN (GLUCOPHAGE) 1000 MG tablet Take 1 tablet (1,000 mg total) by mouth 2 (two) times daily with a meal. 08/25/18  Yes Ghimire, Henreitta Leber, MD  nitroGLYCERIN (NITROSTAT) 0.4 MG SL tablet Place 1 tablet (0.4 mg total) under the tongue every 5 (five) minutes as needed for chest pain. 08/25/18  Yes Ghimire, Henreitta Leber, MD  dicyclomine (BENTYL) 20 MG tablet Take 1 tablet (20 mg total) by mouth 2 (two) times daily. Patient not taking: Reported on 09/05/2018 08/25/18   Jonetta Osgood, MD  glucose blood (AGAMATRIX PRESTO TEST) test strip Check blood sugars prior to meals and at bedtime 08/25/18   Ghimire, Henreitta Leber, MD  Family History  Problem Relation Age of Onset  . Diabetes Mother   . Heart failure Mother   . Diabetes Sister   . Diabetes Brother     Social History   Socioeconomic History  . Marital status: Single    Spouse name: Not on file  . Number of children: Not on file  . Years of education: Not on file  . Highest education level: Not on file  Occupational History  . Not on file  Social Needs  . Financial resource strain: Not on file  . Food insecurity:    Worry: Not on file    Inability: Not on file  . Transportation needs:    Medical: Not on file     Non-medical: Not on file  Tobacco Use  . Smoking status: Current Every Day Smoker    Packs/day: 0.25    Years: 25.00    Pack years: 6.25    Types: Cigarettes  . Smokeless tobacco: Never Used  . Tobacco comment: 2 per day   Substance and Sexual Activity  . Alcohol use: No    Frequency: Never  . Drug use: No  . Sexual activity: Yes  Lifestyle  . Physical activity:    Days per week: Not on file    Minutes per session: Not on file  . Stress: Not on file  Relationships  . Social connections:    Talks on phone: Not on file    Gets together: Not on file    Attends religious service: Not on file    Active member of club or organization: Not on file    Attends meetings of clubs or organizations: Not on file    Relationship status: Not on file  Other Topics Concern  . Not on file  Social History Narrative  . Not on file     Review of Systems: A 12 point ROS discussed and pertinent positives are indicated in the HPI above.  All other systems are negative.  Review of Systems  Constitutional: Negative for chills and fever.  Respiratory: Negative for shortness of breath and wheezing.   Cardiovascular: Negative for chest pain and palpitations.  Gastrointestinal: Negative for abdominal pain.  Neurological: Negative for dizziness and headaches.    Vital Signs: BP 103/80   Pulse (!) 116   Temp 98.6 F (37 C) (Oral)   Resp (!) 34   Ht 5\' 11"  (1.803 m)   Wt 147 lb 0.8 oz (66.7 kg)   SpO2 100%   BMI 20.51 kg/m   Physical Exam Vitals signs and nursing note reviewed.  Constitutional:      General: He is not in acute distress.    Appearance: Normal appearance.  Cardiovascular:     Rate and Rhythm: Regular rhythm. Tachycardia present.     Heart sounds: Normal heart sounds. No murmur.  Pulmonary:     Effort: Pulmonary effort is normal. No respiratory distress.     Breath sounds: Normal breath sounds. No wheezing.  Skin:    General: Skin is warm and dry.  Neurological:      Mental Status: He is alert and oriented to person, place, and time.  Psychiatric:        Mood and Affect: Mood normal.        Behavior: Behavior normal.        Thought Content: Thought content normal.        Judgment: Judgment normal.      MD Evaluation Airway: WNL Airway comments: Intubated  Heart: WNL Heart  comments: Tachycardic Abdomen: WNL Abdomen comments: protuberant, soft Chest/ Lungs: WNL Chest/ lungs comments: post vascular surgery for embolus to left leg  Other Pertinent Findings: Ventilatory sounds preseny ASA  Classification: 4 Mallampati/Airway Score: One   Imaging: Dg Chest 2 View  Result Date: 09/18/2018 CLINICAL DATA:  58 year old former smoker who had a fever and shortness of breath yesterday. Breathing has improved significantly after nebulizer treatment. Follow-up pneumonia. EXAM: CHEST - 2 VIEW COMPARISON:  09/09/2018 and earlier, including CT chest 09/06/2018. FINDINGS: Cardiac silhouette normal in size, unchanged. Thoracic aorta mildly tortuous, unchanged. Hilar and mediastinal contours otherwise unremarkable. Mild emphysematous changes in both lungs as noted on the recent CT. Linear atelectasis or developing scar in the LEFT LOWER LOBE at the site of the recent pneumonia. Lungs otherwise clear. No localized airspace consolidation. No pleural effusions. No pneumothorax. Normal pulmonary vascularity. Visualized bony thorax intact. IMPRESSION: COPD/emphysema. Linear atelectasis or developing scar in the LEFT LOWER LOBE at the site of the recent pneumonia. No acute cardiopulmonary disease otherwise. Electronically Signed   By: Evangeline Dakin M.D.   On: 09/18/2018 12:41   Dg Abd 1 View  Result Date: 09/07/2018 CLINICAL DATA:  OG tube placement. EXAM: ABDOMEN - 1 VIEW COMPARISON:  CT abdomen pelvis 08/22/2018. FINDINGS: OG tube is in place with the side port and tip in the body of the stomach. The stomach is distended. IMPRESSION: OG tube in good position.  Electronically Signed   By: Inge Rise M.D.   On: 09/07/2018 13:31   Ct Angio Chest Pe W Or Wo Contrast  Result Date: 09/06/2018 CLINICAL DATA:  Evaluate thoracic aortic aneurysm. EXAM: CT ANGIOGRAPHY CHEST WITH CONTRAST TECHNIQUE: Multidetector CT imaging of the chest was performed using the standard protocol during bolus administration of intravenous contrast. Multiplanar CT image reconstructions and MIPs were obtained to evaluate the vascular anatomy. CONTRAST:  65 mL ISOVUE-370 IOPAMIDOL (ISOVUE-370) INJECTION 76% COMPARISON:  01/30/2018; 10/14/2016; 04/25/2026 FINDINGS: Vascular Findings: No evidence of thoracic aortic aneurysm with measurements as follows. No definite evidence of thoracic aortic dissection or periaortic stranding on this nongated examination. Bovine configuration of the aortic arch. The branch vessels of the aortic arch appear widely patent throughout their imaged courses. The descending thoracic aorta is of normal caliber and widely patent without hemodynamically significant stenosis. There is a very minimal amount of atherosclerotic plaque involving the proximal aspect of the descending thoracic aorta. Cardiomegaly.  No pericardial effusion. Although this examination was not tailored for the evaluation the pulmonary arteries, there are no discrete filling defects within the central pulmonary arterial tree to suggest central pulmonary embolism. Borderline enlarged caliber the main pulmonary artery measuring 32 mm in diameter (image 67, series 6) ------------------------------------------------------------- Thoracic aortic measurements: Sinotubular junction 33 mm as measured in greatest oblique coronal dimension. Proximal ascending aorta 35 mm as measured in greatest oblique axial dimension at the level of the main pulmonary artery (image 70, series 6); and approximately 36 mm in greatest oblique short axis coronal diameter (coronal image 48, series 9). Aortic arch aorta 28 mm as  measured in greatest oblique sagittal dimension. Proximal descending thoracic aorta 30 mm as measured in greatest oblique axial dimension at the level of the main pulmonary artery. Distal descending thoracic aorta 29 mm as measured in greatest oblique axial dimension at the level of the diaphragmatic hiatus. Review of the MIP images confirms the above findings. ------------------------------------------------------------- Non-Vascular Findings: Mediastinum/Lymph Nodes: Prominent right hilar lymph nodes are not enlarged by size criteria  with index right suprahilar lymph node measuring 0.8 cm in greatest short axis diameter (image 71, series 6) and index right infrahilar lymph node measuring 0.7 cm (image 88). No definitive bulky mediastinal, hilar or axillary lymphadenopathy. Lungs/Pleura: Interval development of small bilateral pleural effusions with worsening bibasilar consolidative opacities and associated air bronchograms, right greater than left. Interval development of an approximately 1.0 x 0.5 cm subpleural consolidative opacity within the right upper lobe (image 36, series 7). Unchanged punctate (approximately 3 mm) calcified granuloma within the left lower lobe (image 75, series 3). Evaluation for additional pulmonary nodules is degraded secondary to bibasilar airspace opacities. The central pulmonary airways appear widely patent. Upper abdomen: Fluid distention of the mid and distal aspects of the esophagus with marked distension of the imaged superior aspect of the stomach. Musculoskeletal: No acute or aggressive osseous abnormalities. There is incomplete fusion involving the spinous processes of multiple thoracic vertebral bodies, unchanged. Regional soft tissues appear normal. Normal appearance of the thyroid gland. IMPRESSION: 1. No evidence of thoracic aortic aneurysm or dissection. 2. Small bilateral effusions with associated bibasilar consolidative opacities with associated air bronchograms,  atelectasis versus infiltrate. Follow-up chest radiograph in 4-6 weeks after treatment is recommended to ensure resolution. 3. Fluid distension of the mid and distal aspects of the esophagus as well as marked distension of the imaged superior aspect stomach - correlation for gastric outlet obstructive symptoms is advised. 4. Cardiomegaly with enlargement of the caliber the main pulmonary artery as could be seen in the setting of pulmonary arterial hypertension. Electronically Signed   By: Sandi Mariscal M.D.   On: 09/06/2018 09:20   Mr Abdomen W Wo Contrast  Result Date: 09/12/2018 CLINICAL DATA:  Suspicion of pancreatic neck mass on CT. EXAM: MRI ABDOMEN WITHOUT AND WITH CONTRAST TECHNIQUE: Multiplanar multisequence MR imaging of the abdomen was performed both before and after the administration of intravenous contrast. CONTRAST:  6 cc Gadavist COMPARISON:  CT of 1 day prior. Stone study of 08/22/2018. More remote CT of 12/19/2017. FINDINGS: Moderatemultifactorial degradation, including respiratory motion and susceptibility artifact from embolization coils. Lower chest: Bilateral pleural fluid with bibasilar airspace disease. Normal heart size. Hepatobiliary: Left hepatic lobe 9 mm cyst. Too small to characterizea right hepatic lobe lesion is most likely a cyst at 2 mm. Normal gallbladder, without biliary ductal dilatation. Pancreas: An area of soft tissue fullness and restricted diffusion is identified within the pancreatic neck, corresponding to the abnormality on CT. This measures on the order of 3.9 x 3.6 cm on image 76/12. Also 3.6 x 3.6 cm on image 35/31. Results in pancreatic duct dilatation within the upstream body and tail on image 17/11. Involves the celiac and its branches including on image 75/12. Spleen:  Grossly within normal limits. Adrenals/Urinary Tract: Normal adrenal glands. Grossly normal kidneys, without hydronephrosis. Stomach/Bowel: Gastric distension with proximal wall and fold thickening,  including on image 73/12. Grossly normal abdominal bowel loops. Vascular/Lymphatic: Normal caliber of the aorta. 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 Limited evaluation for abdominal adenopathy. Other:  Small volume abdominal ascites. Musculoskeletal: No acute osseous abnormality. IMPRESSION: 1. Moderate motion and susceptibility artifact degradation throughout. 2. Confirmation of a pancreatic neck mass, most consistent with adenocarcinoma. Involvement of the celiac and its branches, most consistent with a non-operative lesion. 3. Consider endoscopic ultrasound sampling. 4. Gastric wall and fold thickening, suggesting gastritis. 5. Small bilateral pleural effusions with adjacent airspace disease. Electronically Signed   By: Abigail Miyamoto M.D.   On: 09/12/2018 03:04  Ir Angiogram Visceral Selective  Result Date: 09/07/2018 INDICATION: 58 year old male with large volume upper GI bleed secondary to a very large duodenal ulcer. Patient underwent endoscopic intervention but continues to require pressors and large volume transfusion. He presents for mesenteric arteriography and embolization of any active bleeding and prophylactic embolization of the gastroduodenal artery. EXAM: IR ULTRASOUND GUIDANCE VASC ACCESS RIGHT; ADDITIONAL ARTERIOGRAPHY; IR EMBO ART VEN HEMORR LYMPH EXTRAV INC GUIDE ROADMAPPING; SELECTIVE VISCERAL ARTERIOGRAPHY 1. Ultrasound-guided vascular access 2. Catheterization of the left gastric artery where it arises directly from the aorta with arteriogram 3. Catheterization of the celiac artery with arteriogram 4. Catheterization of the common hepatic artery with arteriogram 5. Catheterization of the gastroduodenal artery with arteriogram 6. Coil embolization of the gastroduodenal artery 7. Catheterization of the superior mesenteric artery with arteriogram MEDICATIONS: None ANESTHESIA/SEDATION: Patient intubated and on propofol drip CONTRAST:  70 mL Isovue 370 FLUOROSCOPY TIME:   Fluoroscopy Time: 11 minutes 30 seconds (487 mGy). COMPLICATIONS: None immediate. PROCEDURE: Informed consent was obtained from the patient following explanation of the procedure, risks, benefits and alternatives. The patient understands, agrees and consents for the procedure. All questions were addressed. A time out was performed prior to the initiation of the procedure. Maximal barrier sterile technique utilized including caps, mask, sterile gowns, sterile gloves, large sterile drape, hand hygiene, and Betadine prep. The right common femoral artery was interrogated with ultrasound and found to be widely patent. An image was obtained and stored for the medical record. Local anesthesia was attained by infiltration with 1% lidocaine. A small dermatotomy was made. Under real-time sonographic guidance, the vessel was punctured with a 21 gauge micropuncture needle. Using standard technique, the initial micro needle was exchanged over a 0.018 micro wire for a transitional 4 [place] micro sheath. The micro sheath was then exchanged over a 0.035 wire for a 5 French vascular sheath. A C2 cobra catheter was advanced in the abdominal aorta over a Bentson wire. The catheter was used to select the first vessel arising from the ventral aorta. Contrast was injected. This is a left gastric artery arising directly from the aorta. Anatomy is normal. No evidence of active bleeding. The Cobra catheter was then used to select the origin of the celiac artery. Arteriography was performed. The splenic artery is small in caliber. The hepatic artery demonstrates normal hepatic arterial anatomy. The gastroduodenal artery is slightly irregular with spasm in the proximal segment. No evidence of active extravasation. A glidewire was successfully advanced into the common hepatic artery and the C2 cobra catheter was advanced into the common hepatic artery. Additional arteriography was performed in multiple obliquities. Again, the proximal  gastroduodenal artery is irregular and spasmodic. No evidence of dissection, aneurysm or active bleeding. A Lantern microcatheter was then advanced over a Fathom 16 wire and used to select the distal aspect of the gastroduodenal artery. Arteriography was performed. The gastroepiploic arteries are widely patent. No evidence of active bleeding. Coil embolization was then performed using a 4 mm Ruby POD detachable micro coil followed by a 45 cm Ruby packing coil. Post embolization arteriography demonstrates complete occlusion of the vessel with stasis in the proximal segment. The Lantern microcatheter was brought back into the common hepatic artery and arteriography was performed. Again, complete occlusion of the gastroduodenal artery. The right gastric artery is visualized. No evidence of active bleeding from the right gastric artery. The microcatheter was removed. The C2 cobra catheter was used to select the superior mesenteric artery. Arteriography was performed. Patent pancreaticoduodenal arcade supplying  the gastroepiploic artery. No evidence of active hemorrhage. The C2 cobra catheter was removed. A limited right common femoral arteriogram was performed confirming common femoral arterial access. Hemostasis was attained with the assistance of a Cordis ExoSeal extra arterial vascular plug. IMPRESSION: 1. No evidence of active hemorrhage. 2. 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 3. Prophylactic coil embolization of the gastroduodenal artery. Signed, Criselda Peaches, MD, [facility] Electronically Signed   By: Jacqulynn Cadet M.D.   On: 09/07/2018 22:32   Ir Angiogram Visceral Selective  Result Date: 09/07/2018 INDICATION: 58 year old male with large volume upper GI bleed secondary to a very large duodenal ulcer. Patient underwent endoscopic intervention but continues to require pressors and large volume transfusion.  He presents for mesenteric arteriography and embolization of any active bleeding and prophylactic embolization of the gastroduodenal artery. EXAM: IR ULTRASOUND GUIDANCE VASC ACCESS RIGHT; ADDITIONAL ARTERIOGRAPHY; IR EMBO ART VEN HEMORR LYMPH EXTRAV INC GUIDE ROADMAPPING; SELECTIVE VISCERAL ARTERIOGRAPHY 1. Ultrasound-guided vascular access 2. Catheterization of the left gastric artery where it arises directly from the aorta with arteriogram 3. Catheterization of the celiac artery with arteriogram 4. Catheterization of the common hepatic artery with arteriogram 5. Catheterization of the gastroduodenal artery with arteriogram 6. Coil embolization of the gastroduodenal artery 7. Catheterization of the superior mesenteric artery with arteriogram MEDICATIONS: None ANESTHESIA/SEDATION: Patient intubated and on propofol drip CONTRAST:  70 mL Isovue 370 FLUOROSCOPY TIME:  Fluoroscopy Time: 11 minutes 30 seconds (487 mGy). COMPLICATIONS: None immediate. PROCEDURE: Informed consent was obtained from the patient following explanation of the procedure, risks, benefits and alternatives. The patient understands, agrees and consents for the procedure. All questions were addressed. A time out was performed prior to the initiation of the procedure. Maximal barrier sterile technique utilized including caps, mask, sterile gowns, sterile gloves, large sterile drape, hand hygiene, and Betadine prep. The right common femoral artery was interrogated with ultrasound and found to be widely patent. An image was obtained and stored for the medical record. Local anesthesia was attained by infiltration with 1% lidocaine. A small dermatotomy was made. Under real-time sonographic guidance, the vessel was punctured with a 21 gauge micropuncture needle. Using standard technique, the initial micro needle was exchanged over a 0.018 micro wire for a transitional 4 [place] micro sheath. The micro sheath was then exchanged over a 0.035 wire for a 5  French vascular sheath. A C2 cobra catheter was advanced in the abdominal aorta over a Bentson wire. The catheter was used to select the first vessel arising from the ventral aorta. Contrast was injected. This is a left gastric artery arising directly from the aorta. Anatomy is normal. No evidence of active bleeding. The Cobra catheter was then used to select the origin of the celiac artery. Arteriography was performed. The splenic artery is small in caliber. The hepatic artery demonstrates normal hepatic arterial anatomy. The gastroduodenal artery is slightly irregular with spasm in the proximal segment. No evidence of active extravasation. A glidewire was successfully advanced into the common hepatic artery and the C2 cobra catheter was advanced into the common hepatic artery. Additional arteriography was performed in multiple obliquities. Again, the proximal gastroduodenal artery is irregular and spasmodic. No evidence of dissection, aneurysm or active bleeding. A Lantern microcatheter was then advanced over a Fathom 16 wire and used to select the distal aspect of the gastroduodenal artery. Arteriography was performed. The gastroepiploic arteries are widely patent. No evidence of active bleeding. Coil embolization was then performed  using a 4 mm Ruby POD detachable micro coil followed by a 45 cm Ruby packing coil. Post embolization arteriography demonstrates complete occlusion of the vessel with stasis in the proximal segment. The Lantern microcatheter was brought back into the common hepatic artery and arteriography was performed. Again, complete occlusion of the gastroduodenal artery. The right gastric artery is visualized. No evidence of active bleeding from the right gastric artery. The microcatheter was removed. The C2 cobra catheter was used to select the superior mesenteric artery. Arteriography was performed. Patent pancreaticoduodenal arcade supplying the gastroepiploic artery. No evidence of active  hemorrhage. The C2 cobra catheter was removed. A limited right common femoral arteriogram was performed confirming common femoral arterial access. Hemostasis was attained with the assistance of a Cordis ExoSeal extra arterial vascular plug. IMPRESSION: 1. No evidence of active hemorrhage. 2. 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 3. Prophylactic coil embolization of the gastroduodenal artery. Signed, Criselda Peaches, MD, [facility] Electronically Signed   By: Jacqulynn Cadet M.D.   On: 09/07/2018 22:32   Ir Angiogram Selective Each Additional Vessel  Result Date: 09/07/2018 INDICATION: 58 year old male with large volume upper GI bleed secondary to a very large duodenal ulcer. Patient underwent endoscopic intervention but continues to require pressors and large volume transfusion. He presents for mesenteric arteriography and embolization of any active bleeding and prophylactic embolization of the gastroduodenal artery. EXAM: IR ULTRASOUND GUIDANCE VASC ACCESS RIGHT; ADDITIONAL ARTERIOGRAPHY; IR EMBO ART VEN HEMORR LYMPH EXTRAV INC GUIDE ROADMAPPING; SELECTIVE VISCERAL ARTERIOGRAPHY 1. Ultrasound-guided vascular access 2. Catheterization of the left gastric artery where it arises directly from the aorta with arteriogram 3. Catheterization of the celiac artery with arteriogram 4. Catheterization of the common hepatic artery with arteriogram 5. Catheterization of the gastroduodenal artery with arteriogram 6. Coil embolization of the gastroduodenal artery 7. Catheterization of the superior mesenteric artery with arteriogram MEDICATIONS: None ANESTHESIA/SEDATION: Patient intubated and on propofol drip CONTRAST:  70 mL Isovue 370 FLUOROSCOPY TIME:  Fluoroscopy Time: 11 minutes 30 seconds (487 mGy). COMPLICATIONS: None immediate. PROCEDURE: Informed consent was obtained from the patient following explanation of the  procedure, risks, benefits and alternatives. The patient understands, agrees and consents for the procedure. All questions were addressed. A time out was performed prior to the initiation of the procedure. Maximal barrier sterile technique utilized including caps, mask, sterile gowns, sterile gloves, large sterile drape, hand hygiene, and Betadine prep. The right common femoral artery was interrogated with ultrasound and found to be widely patent. An image was obtained and stored for the medical record. Local anesthesia was attained by infiltration with 1% lidocaine. A small dermatotomy was made. Under real-time sonographic guidance, the vessel was punctured with a 21 gauge micropuncture needle. Using standard technique, the initial micro needle was exchanged over a 0.018 micro wire for a transitional 4 [place] micro sheath. The micro sheath was then exchanged over a 0.035 wire for a 5 French vascular sheath. A C2 cobra catheter was advanced in the abdominal aorta over a Bentson wire. The catheter was used to select the first vessel arising from the ventral aorta. Contrast was injected. This is a left gastric artery arising directly from the aorta. Anatomy is normal. No evidence of active bleeding. The Cobra catheter was then used to select the origin of the celiac artery. Arteriography was performed. The splenic artery is small in caliber. The hepatic artery demonstrates normal hepatic arterial anatomy. The gastroduodenal artery is slightly irregular with spasm in  the proximal segment. No evidence of active extravasation. A glidewire was successfully advanced into the common hepatic artery and the C2 cobra catheter was advanced into the common hepatic artery. Additional arteriography was performed in multiple obliquities. Again, the proximal gastroduodenal artery is irregular and spasmodic. No evidence of dissection, aneurysm or active bleeding. A Lantern microcatheter was then advanced over a Fathom 16 wire and  used to select the distal aspect of the gastroduodenal artery. Arteriography was performed. The gastroepiploic arteries are widely patent. No evidence of active bleeding. Coil embolization was then performed using a 4 mm Ruby POD detachable micro coil followed by a 45 cm Ruby packing coil. Post embolization arteriography demonstrates complete occlusion of the vessel with stasis in the proximal segment. The Lantern microcatheter was brought back into the common hepatic artery and arteriography was performed. Again, complete occlusion of the gastroduodenal artery. The right gastric artery is visualized. No evidence of active bleeding from the right gastric artery. The microcatheter was removed. The C2 cobra catheter was used to select the superior mesenteric artery. Arteriography was performed. Patent pancreaticoduodenal arcade supplying the gastroepiploic artery. No evidence of active hemorrhage. The C2 cobra catheter was removed. A limited right common femoral arteriogram was performed confirming common femoral arterial access. Hemostasis was attained with the assistance of a Cordis ExoSeal extra arterial vascular plug. IMPRESSION: 1. No evidence of active hemorrhage. 2. 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 3. Prophylactic coil embolization of the gastroduodenal artery. Signed, Criselda Peaches, MD, [place] Electronically Signed   By: Jacqulynn Cadet M.D.   On: 09/07/2018 22:32   Ir Angiogram Selective Each Additional Vessel  Result Date: 09/07/2018 INDICATION: 58 year old male with large volume upper GI bleed secondary to a very large duodenal ulcer. Patient underwent endoscopic intervention but continues to require pressors and large volume transfusion. He presents for mesenteric arteriography and embolization of any active bleeding and prophylactic embolization of the gastroduodenal artery. EXAM: IR  ULTRASOUND GUIDANCE VASC ACCESS RIGHT; ADDITIONAL ARTERIOGRAPHY; IR EMBO ART VEN HEMORR LYMPH EXTRAV INC GUIDE ROADMAPPING; SELECTIVE VISCERAL ARTERIOGRAPHY 1. Ultrasound-guided vascular access 2. Catheterization of the left gastric artery where it arises directly from the aorta with arteriogram 3. Catheterization of the celiac artery with arteriogram 4. Catheterization of the common hepatic artery with arteriogram 5. Catheterization of the gastroduodenal artery with arteriogram 6. Coil embolization of the gastroduodenal artery 7. Catheterization of the superior mesenteric artery with arteriogram MEDICATIONS: None ANESTHESIA/SEDATION: Patient intubated and on propofol drip CONTRAST:  70 mL Isovue 370 FLUOROSCOPY TIME:  Fluoroscopy Time: 11 minutes 30 seconds (487 mGy). COMPLICATIONS: None immediate. PROCEDURE: Informed consent was obtained from the patient following explanation of the procedure, risks, benefits and alternatives. The patient understands, agrees and consents for the procedure. All questions were addressed. A time out was performed prior to the initiation of the procedure. Maximal barrier sterile technique utilized including caps, mask, sterile gowns, sterile gloves, large sterile drape, hand hygiene, and Betadine prep. The right common femoral artery was interrogated with ultrasound and found to be widely patent. An image was obtained and stored for the medical record. Local anesthesia was attained by infiltration with 1% lidocaine. A small dermatotomy was made. Under real-time sonographic guidance, the vessel was punctured with a 21 gauge micropuncture needle. Using standard technique, the initial micro needle was exchanged over a 0.018 micro wire for a transitional 4 [place] micro sheath. The micro sheath was then exchanged over a 0.035 wire for a  5 French vascular sheath. A C2 cobra catheter was advanced in the abdominal aorta over a Bentson wire. The catheter was used to select the first vessel  arising from the ventral aorta. Contrast was injected. This is a left gastric artery arising directly from the aorta. Anatomy is normal. No evidence of active bleeding. The Cobra catheter was then used to select the origin of the celiac artery. Arteriography was performed. The splenic artery is small in caliber. The hepatic artery demonstrates normal hepatic arterial anatomy. The gastroduodenal artery is slightly irregular with spasm in the proximal segment. No evidence of active extravasation. A glidewire was successfully advanced into the common hepatic artery and the C2 cobra catheter was advanced into the common hepatic artery. Additional arteriography was performed in multiple obliquities. Again, the proximal gastroduodenal artery is irregular and spasmodic. No evidence of dissection, aneurysm or active bleeding. A Lantern microcatheter was then advanced over a Fathom 16 wire and used to select the distal aspect of the gastroduodenal artery. Arteriography was performed. The gastroepiploic arteries are widely patent. No evidence of active bleeding. Coil embolization was then performed using a 4 mm Ruby POD detachable micro coil followed by a 45 cm Ruby packing coil. Post embolization arteriography demonstrates complete occlusion of the vessel with stasis in the proximal segment. The Lantern microcatheter was brought back into the common hepatic artery and arteriography was performed. Again, complete occlusion of the gastroduodenal artery. The right gastric artery is visualized. No evidence of active bleeding from the right gastric artery. The microcatheter was removed. The C2 cobra catheter was used to select the superior mesenteric artery. Arteriography was performed. Patent pancreaticoduodenal arcade supplying the gastroepiploic artery. No evidence of active hemorrhage. The C2 cobra catheter was removed. A limited right common femoral arteriogram was performed confirming common femoral arterial access.  Hemostasis was attained with the assistance of a Cordis ExoSeal extra arterial vascular plug. IMPRESSION: 1. No evidence of active hemorrhage. 2. 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 3. Prophylactic coil embolization of the gastroduodenal artery. Signed, Criselda Peaches, MD, [place] [facility] Electronically Signed   By: Jacqulynn Cadet M.D.   On: 09/07/2018 22:32   US Renal  Result Date: 09/23/2018 CLINICAL DATA:  Acute renal failure. EXAM: RENAL / URINARY TRACT ULTRASOUND COMPLETE COMPARISON:  CT angiogram abdomen and pelvis 09/11/2018. FINDINGS: Right Kidney: Renal measurements: 11.8 x 6.3 x 7.4 cm = volume: 289.4 mL. Cortical echogenicity is increased. No mass or hydronephrosis visualized. Left Kidney: Renal measurements: 11.7 x 5.7 x 5.8 cm = volume: 204.1 mL. Cortical echogenicity is increased. 0.7 cm in diameter cyst incidentally noted. No solid mass or hydronephrosis visualized. Bladder: Appears normal for degree of bladder distention. IMPRESSION: Negative for hydronephrosis or acute abnormality. Increased cortical echogenicity bilaterally is compatible with medical renal disease. Electronically Signed   By: Inge Rise M.D.   On: 09/23/2018 11:22   Ir US Guide Vasc Access Right  Result Date: 09/07/2018 INDICATION: 58 year old male with large volume upper GI bleed secondary to a very large duodenal ulcer. Patient underwent endoscopic intervention but continues to require pressors and large volume transfusion. He presents for mesenteric arteriography and embolization of any active bleeding and prophylactic embolization of the gastroduodenal artery. EXAM: IR ULTRASOUND GUIDANCE VASC ACCESS RIGHT; ADDITIONAL ARTERIOGRAPHY; IR EMBO ART VEN HEMORR LYMPH EXTRAV INC GUIDE ROADMAPPING; SELECTIVE VISCERAL ARTERIOGRAPHY 1. Ultrasound-guided vascular access 2. Catheterization of the left gastric artery where it  arises directly from the aorta with  arteriogram 3. Catheterization of the celiac artery with arteriogram 4. Catheterization of the common hepatic artery with arteriogram 5. Catheterization of the gastroduodenal artery with arteriogram 6. Coil embolization of the gastroduodenal artery 7. Catheterization of the superior mesenteric artery with arteriogram MEDICATIONS: None ANESTHESIA/SEDATION: Patient intubated and on propofol drip CONTRAST:  70 mL Isovue 370 FLUOROSCOPY TIME:  Fluoroscopy Time: 11 minutes 30 seconds (487 mGy). COMPLICATIONS: None immediate. PROCEDURE: Informed consent was obtained from the patient following explanation of the procedure, risks, benefits and alternatives. The patient understands, agrees and consents for the procedure. All questions were addressed. A time out was performed prior to the initiation of the procedure. Maximal barrier sterile technique utilized including caps, mask, sterile gowns, sterile gloves, large sterile drape, hand hygiene, and Betadine prep. The right common femoral artery was interrogated with ultrasound and found to be widely patent. An image was obtained and stored for the medical record. Local anesthesia was attained by infiltration with 1% lidocaine. A small dermatotomy was made. Under real-time sonographic guidance, the vessel was punctured with a 21 gauge micropuncture needle. Using standard technique, the initial micro needle was exchanged over a 0.018 micro wire for a transitional 4 [place] micro sheath. The micro sheath was then exchanged over a 0.035 wire for a 5 French vascular sheath. A C2 cobra catheter was advanced in the abdominal aorta over a Bentson wire. The catheter was used to select the first vessel arising from the ventral aorta. Contrast was injected. This is a left gastric artery arising directly from the aorta. Anatomy is normal. No evidence of active bleeding. The Cobra catheter was then used to select the origin of the celiac artery.  Arteriography was performed. The splenic artery is small in caliber. The hepatic artery demonstrates normal hepatic arterial anatomy. The gastroduodenal artery is slightly irregular with spasm in the proximal segment. No evidence of active extravasation. A glidewire was successfully advanced into the common hepatic artery and the C2 cobra catheter was advanced into the common hepatic artery. Additional arteriography was performed in multiple obliquities. Again, the proximal gastroduodenal artery is irregular and spasmodic. No evidence of dissection, aneurysm or active bleeding. A Lantern microcatheter was then advanced over a Fathom 16 wire and used to select the distal aspect of the gastroduodenal artery. Arteriography was performed. The gastroepiploic arteries are widely patent. No evidence of active bleeding. Coil embolization was then performed using a 4 mm Ruby POD detachable micro coil followed by a 45 cm Ruby packing coil. Post embolization arteriography demonstrates complete occlusion of the vessel with stasis in the proximal segment. The Lantern microcatheter was brought back into the common hepatic artery and arteriography was performed. Again, complete occlusion of the gastroduodenal artery. The right gastric artery is visualized. No evidence of active bleeding from the right gastric artery. The microcatheter was removed. The C2 cobra catheter was used to select the superior mesenteric artery. Arteriography was performed. Patent pancreaticoduodenal arcade supplying the gastroepiploic artery. No evidence of active hemorrhage. The C2 cobra catheter was removed. A limited right common femoral arteriogram was performed confirming common femoral arterial access. Hemostasis was attained with the assistance of a Cordis ExoSeal extra arterial vascular plug. IMPRESSION: 1. No evidence of active hemorrhage. 2. 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 3. Prophylactic coil embolization of the  gastroduodenal artery. Signed, Criselda Peaches, MD, [place] [facility] Electronically Signed   By: Jacqulynn Cadet M.D.   On: 09/07/2018 22:32   Dg Chest [facility]  1 View  Result Date: 09/27/2018 CLINICAL DATA:  Shortness of breath. Respiratory distress. EXAM: PORTABLE CHEST 1 VIEW COMPARISON:  09/18/2018. Chest CT 09/06/2018 FINDINGS: Lower lung volumes from prior exam. Mild cardiomegaly with unchanged mediastinal contours. Slight worsening patchy opacity at the left lung base. Patient is rotated. No pneumothorax or large pleural effusion. IMPRESSION: Slight worsening patchy opacity at the left lung base, may be atelectasis or pneumonia. Electronically Signed   By: Keith Rake M.D.   On: 09/27/2018 02:31   Dg Chest Port 1 View  Result Date: 09/09/2018 CLINICAL DATA:  Respiratory failure EXAM: PORTABLE CHEST 1 VIEW COMPARISON:  09/07/2018 FINDINGS: Endotracheal tube and nasogastric catheter have been removed in the interval. Left jugular central line is again noted in the distal superior vena cava. The cardiac shadow is stable. The lungs are well aerated bilaterally with patchy bibasilar infiltrates and small left pleural effusion. IMPRESSION: Stable bibasilar infiltrates and small left pleural effusion. Electronically Signed   By: Inez Catalina M.D.   On: 09/09/2018 07:17   Dg Chest Port 1 View  Result Date: 09/07/2018 CLINICAL DATA:  Status post fasciotomy and hematoma back UA shins in the right lower leg 2 days ago. Intubated patient. EXAM: PORTABLE CHEST 1 VIEW COMPARISON:  CT scan of the chest of September 06, 2018 FINDINGS: The lungs are well-expanded. There are patchy increased densities at both bases which are slightly more conspicuous today. There is a small right and and somewhat larger left pleural effusion. The heart and pulmonary vascularity are normal. The endotracheal tube tip projects 4.1 cm above the carina. The  esophagogastric tube tip in proximal port project below the GE junction. The left internal jugular venous catheter tip projects over the midportion of the SVC. IMPRESSION: Bibasilar atelectasis or pneumonia. Small right pleural effusion and somewhat larger left pleural effusion. The support tubes are in reasonable position. Electronically Signed   By: David  Martinique M.D.   On: 09/07/2018 13:27   Vas [place] Burnard Bunting With/wo Tbi  Result Date: 09/15/2018 LOWER EXTREMITY DOPPLER STUDY Indications: Peripheral artery disease.  Vascular Interventions: Status post right popliteal - tibial embolectomy and                         fasciotomy. Comparison Study: No exam for comparison Performing Technologist: Birdena Crandall, [place] [facility]  Examination Guidelines: A complete evaluation includes at minimum, Doppler waveform signals and systolic blood pressure reading at the level of bilateral brachial, anterior tibial, and posterior tibial arteries, when vessel segments are accessible. Bilateral testing is considered an integral part of a complete examination. Photoelectric Plethysmograph (PPG) waveforms and toe systolic pressure readings are included as required and additional duplex testing as needed. Limited examinations for reoccurring indications may be performed as noted.  ABI Findings: +--------+------------------+-----+----------+--------+ Right   Rt Pressure (mmHg)IndexWaveform  Comment  +--------+------------------+-----+----------+--------+ [id number]                    triphasic          +--------+------------------+-----+----------+--------+ PTA     179               1.36 triphasic          +--------+------------------+-----+----------+--------+ DP      152               1.15 monophasic         +--------+------------------+-----+----------+--------+ +--------+------------------+-----+---------+-------+ Left    Lt Pressure (mmHg)IndexWaveform Comment  +--------+------------------+-----+---------+-------+ [id number]  triphasic        +--------+------------------+-----+---------+-------+ PTA     176               1.33 triphasic        +--------+------------------+-----+---------+-------+ DP      162               1.23 triphasic        +--------+------------------+-----+---------+-------+ +-------+-----------+-----------+------------+------------+ ABI/TBIToday's ABIToday's TBIPrevious ABIPrevious TBI +-------+-----------+-----------+------------+------------+ Right  1.36                                           +-------+-----------+-----------+------------+------------+ Left   1.33                                           +-------+-----------+-----------+------------+------------+  Summary: Right: Resting right ankle-brachial index indicates noncompressible right lower extremity arteries. Left: Resting left ankle-brachial index indicates noncompressible left lower extremity arteries.  *See table(s) above for measurements and observations.  Electronically signed by Harold Barban MD on 09/15/2018 at 1:50:47 PM.   Final    Ct Pancreas Abd W/wo  Result Date: 09/27/2018 CLINICAL DATA:  Head of pancreas mass. Increased bleeding. Evaluate for duodenal obstruction EXAM: CT ABDOMEN WITHOUT AND WITH CONTRAST TECHNIQUE: Multidetector CT imaging of the abdomen was performed following the standard protocol before and following the bolus administration of intravenous contrast. CONTRAST:  176mL ISOVUE-370 IOPAMIDOL (ISOVUE-370) INJECTION 76% COMPARISON:  MRI 09/12/2018 FINDINGS: Lower chest: Subsegmental atelectasis and or consolidation noted in the left lung base. Hepatobiliary: 1 cm low-attenuation structure in the left lobe of liver is unchanged, image 26/5. No new focal liver abnormalities. Gallbladder appears normal. No intrahepatic bile duct dilatation. No common bile duct dilatation identified. Pancreas: Neck of  pancreas mass is again noted. On today's exam this measures 4.5 x 3.7 by 4.7 cm, image 48/5. Previously 3.6 x 3.6 by 3.2 cm. There is atrophy of the body and tail of pancreas with dilatation of the main duct. Spleen: Normal in size without focal abnormality. Adrenals/Urinary Tract: Adrenal glands are unremarkable. Kidneys are normal, without renal calculi, focal lesion, or hydronephrosis. Stomach/Bowel: There is marked distension of the gastric lumen which is increased from 09/12/2018. The stomach is distended to the level of the distal antrum. No discrete mass identified. There is no abnormal gastric wall thickening or inflammation. No duodenal wall thickening or inflammation identified. There is mild increase caliber of the descending duodenum with a normal caliber transverse duodenum. Visualized small bowel loops are normal in caliber. No pathologic dilatation of the visualized colon. Vascular/Lymphatic: Aortic atherosclerosis. Infrarenal abdominal aortic ectasia measures 3.2 cm in maximum distension. Embolization coils of the gastroduodenal artery noted. The portal vein, portal venous confluence and splenic vein remain patent. Soft tissue encases and partially narrows the celiac and superior mesenteric artery without complete occlusion. The celiac artery remains patent. Other: There is been interval decrease in subcutaneous and intra-abdominal fat. Musculoskeletal: Lumbar spondylosis. No aggressive lytic or sclerotic bone lesions. IMPRESSION: 1. Interval marked distension of the gastric lumen to the level of the gastric antrum. Etiology indeterminate. No discrete mass identified. 2. Interval increase in size of neck of pancreas mass. Partial involvement of the superior mesenteric artery and celiac artery. 3. Unchanged low-attenuation structure within left lobe of liver. Electronically Signed  By: Kerby Moors M.D.   On: 09/27/2018 10:48   Ir Embo Art  [place] Guide Roadmapping  Result  Date: 09/07/2018 INDICATION: 57 year old male with large volume upper GI bleed secondary to a very large duodenal ulcer. Patient underwent endoscopic intervention but continues to require pressors and large volume transfusion. He presents for mesenteric arteriography and embolization of any active bleeding and prophylactic embolization of the gastroduodenal artery. EXAM: IR ULTRASOUND GUIDANCE VASC ACCESS RIGHT; ADDITIONAL ARTERIOGRAPHY; IR EMBO ART VEN HEMORR LYMPH EXTRAV INC GUIDE ROADMAPPING; SELECTIVE VISCERAL ARTERIOGRAPHY 1. Ultrasound-guided vascular access 2. Catheterization of the left gastric artery where it arises directly from the aorta with arteriogram 3. Catheterization of the celiac artery with arteriogram 4. Catheterization of the common hepatic artery with arteriogram 5. Catheterization of the gastroduodenal artery with arteriogram 6. Coil embolization of the gastroduodenal artery 7. Catheterization of the superior mesenteric artery with arteriogram MEDICATIONS: None ANESTHESIA/SEDATION: Patient intubated and on propofol drip CONTRAST:  70 mL Isovue 370 FLUOROSCOPY TIME:  Fluoroscopy Time: 11 minutes 30 seconds (487 mGy). COMPLICATIONS: None immediate. PROCEDURE: Informed consent was obtained from the patient following explanation of the procedure, risks, benefits and alternatives. The patient understands, agrees and consents for the procedure. All questions were addressed. A time out was performed prior to the initiation of the procedure. Maximal barrier sterile technique utilized including caps, mask, sterile gowns, sterile gloves, large sterile drape, hand hygiene, and Betadine prep. The right common femoral artery was interrogated with ultrasound and found to be widely patent. An image was obtained and stored for the medical record. Local anesthesia was attained by infiltration with 1% lidocaine. A small dermatotomy was made. Under real-time sonographic guidance, the vessel was punctured with a  21 gauge micropuncture needle. Using standard technique, the initial micro needle was exchanged over a 0.018 micro wire for a transitional 4 [place] micro sheath. The micro sheath was then exchanged over a 0.035 wire for a 5 French vascular sheath. A C2 cobra catheter was advanced in the abdominal aorta over a Bentson wire. The catheter was used to select the first vessel arising from the ventral aorta. Contrast was injected. This is a left gastric artery arising directly from the aorta. Anatomy is normal. No evidence of active bleeding. The Cobra catheter was then used to select the origin of the celiac artery. Arteriography was performed. The splenic artery is small in caliber. The hepatic artery demonstrates normal hepatic arterial anatomy. The gastroduodenal artery is slightly irregular with spasm in the proximal segment. No evidence of active extravasation. A glidewire was successfully advanced into the common hepatic artery and the C2 cobra catheter was advanced into the common hepatic artery. Additional arteriography was performed in multiple obliquities. Again, the proximal gastroduodenal artery is irregular and spasmodic. No evidence of dissection, aneurysm or active bleeding. A Lantern microcatheter was then advanced over a Fathom 16 wire and used to select the distal aspect of the gastroduodenal artery. Arteriography was performed. The gastroepiploic arteries are widely patent. No evidence of active bleeding. Coil embolization was then performed using a 4 mm Ruby POD detachable micro coil followed by a 45 cm Ruby packing coil. Post embolization arteriography demonstrates complete occlusion of the vessel with stasis in the proximal segment. The Lantern microcatheter was brought back into the common hepatic artery and arteriography was performed. Again, complete occlusion of the gastroduodenal artery. The right gastric artery is visualized. No evidence of active bleeding from the right gastric artery. The  microcatheter  was removed. The C2 cobra catheter was used to select the superior mesenteric artery. Arteriography was performed. Patent pancreaticoduodenal arcade supplying the gastroepiploic artery. No evidence of active hemorrhage. The C2 cobra catheter was removed. A limited right common femoral arteriogram was performed confirming common femoral arterial access. Hemostasis was attained with the assistance of a Cordis ExoSeal extra arterial vascular plug. IMPRESSION: 1. No evidence of active hemorrhage. 2. 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 3. Prophylactic coil embolization of the gastroduodenal artery. Signed, Criselda Peaches, MD, [facility]iology Electronically Signed   By: Jacqulynn Cadet M.D.   On: 09/07/2018 22:32   Ct Angio Abd/pel W/ And/or W/o  Result Date: 09/11/2018 CLINICAL DATA:  Status postoperative right popliteal and tibial embolectomy for embolic disease. Known bilateral iliac artery aneurysmal disease. EXAM: CT ANGIOGRAPHY ABDOMEN AND PELVIS WITH CONTRAST TECHNIQUE: Multidetector CT imaging of the abdomen and pelvis was performed using the standard protocol during bolus administration of intravenous contrast. Multiplanar reconstructed images and MIPs were obtained and reviewed to evaluate the vascular anatomy. CONTRAST:  132mL ISOVUE-370 IOPAMIDOL (ISOVUE-370) INJECTION 76% COMPARISON:  CTA of the abdomen on 01/30/2018 and CT of the abdomen and pelvis with contrast on 12/19/2017. FINDINGS: VASCULAR Aorta: Stable mild dilatation of the infrarenal aorta with maximal diameter of 3.1 cm. Celiac: Mild origin stenosis of 25-30%. Distal branches are patent. Status post transcatheter coil embolization of the gastroduodenal artery on 09/07/2018. Hepatic arteries are normally patent. SMA: Normally patent. Renals: Bilateral single renal arteries demonstrate normal patency. IMA: Normally patent. Inflow:  Fusiform aneurysmal disease present of bilateral common iliac arteries as previously demonstrated. Maximal caliber of the right common iliac artery is 2.8-2.9 cm and maximal caliber of the right common iliac artery is 2.7 cm. These measurements are stable since prior studies. The significant change since the prior studies is a much more prominent and new component of mural thrombus in both common iliac arteries, right greater than left. In the proximal right common iliac artery, mural thrombus narrows the aneurysmal lumen by at least 50% in its narrowest portion. Eccentric mural thrombus is present at the origin of the right common iliac artery and also distally just prior to the common iliac bifurcation without significant luminal stenosis. Aneurysmal disease again noted of bilateral internal iliac arteries with the left measuring approximately 2.5 cm in greatest diameter and the right measuring 1.5 cm. Both internal iliac arteries also demonstrate significant mural thrombus without occlusion. Mural thrombus is stable to slightly more prominent compared to the 12/19/2017 study. Bilateral external iliac arteries are normally patent and demonstrate diffuse arteriomegaly with maximum caliber 1.5 cm. Proximal Outflow: Bilateral common femoral arteries demonstrate dilatation with the left measuring up to 2.1 cm in diameter. Mural thrombus is present along the posterior lumen with approximately 50% reduction in luminal diameter. No thrombus is seen extending into proximal SFA or profunda femoral arteries. On the right side the common femoral artery measures up to 1.8 cm in the femoral bifurcation appears widely patent. Veins: Delayed venous phase imaging shows patent venous structures without evidence of thrombus. There is some probable mass effect of ectatic external iliac arteries on the distal aspects of bilateral external iliac veins without evidence of associated deep venous thrombosis or significant collateral vein  formation. Review of the MIP images confirms the above findings. NON-VASCULAR Lower chest: Small bilateral pleural effusions with bibasilar atelectasis. Hepatobiliary: No focal liver abnormality is seen. No gallstones, gallbladder wall thickening, or biliary dilatation. Pancreas:  Since the March study, there is a change in appearance of the pancreas on the venous phase of imaging. The tail of the pancreas now appears progressively atrophic with associated pancreatic ductal dilatation up to approximately 7 mm. At the junction of the body and head of the pancreas, there is suggestion a potential ill-defined low-attenuation mass that could measure as much as 3 cm in AP diameter. Borders are very difficult to delineate by CT. There is no associated biliary obstruction. 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 Recommend eventual correlation with MRI of the abdomen with and without gadolinium. There may be a window of time recommended to wait to perform MRI after placement of the gastroduodenal artery Ruby coils. This can be further investigated based on manufacturer recommendation. If there may be a significant delay in ability to perform MRI, consider endoscopic ultrasound to investigate the pancreas. Spleen: Normal in size without focal abnormality. Adrenals/Urinary Tract: Adrenal glands are unremarkable. Kidneys are normal, without renal calculi, focal lesion, or hydronephrosis. Bladder is unremarkable. Stomach/Bowel: Gastric fold thickening and prominent fold thickening of the duodenum may be consistent with known peptic ulcer disease. No evidence of bowel obstruction or free intraperitoneal air. No focal abscess identified. Lymphatic: No enlarged lymph nodes identified. Reproductive: Prostate is unremarkable. Other: Diffuse anasarca of the body wall and edema of the mesenteric and retroperitoneal fat likely relate to low albumin and malnutrition. Musculoskeletal: Moderate degenerative disc disease at L5-S1.  IMPRESSION: VASCULAR 1. 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 No complications evident after embolization. 2. Stable mild aneurysmal disease of the distal abdominal aorta measuring 3.1 cm. 3. 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 The major change since prior imaging is significant increase in mural thrombus within the dilated common iliac arteries bilaterally, especially on the right. This is most likely the source distal emboli to the right distal leg. Mural thrombus in the internal iliac artery aneurysms is stable to slightly more prominent. 4. Stable diffuse enlargement bilateral external iliac and common femoral arteries. The left common femoral artery does contain significant posterior wall mural thrombus. NON-VASCULAR 1. The most significant nonvascular finding is potential development of a mass of the pancreas at the juncture of the head and body causing progressive atrophy of the tail of the pancreas with associated pancreatic ductal dilatation. On the venous phase of imaging, delineation of the potential mass is quite vague but the region of mass may measure as much as 3 cm. There may be some issue with immediate performance of MRI in the setting recent coiling of the gastroduodenal artery even if the coils are MRI compatible. This can be checked with the manufacturer recommendation. If MRI can be performed immediately, an MRI of the abdomen with and without gadolinium would be helpful for further evaluation. If there is going to be significant delay, consider EUS characterization of the pancreas. 2. Diffuse body wall anasarca and edema of the mesenteric fat and retroperitoneum likely reflects low albumin and malnutrition. 3. Prominent gastric folds and duodenal folds without evidence of bowel obstruction or perforation. Electronically Signed   By: Aletta Edouard M.D.   On: 09/11/2018 12:36    Labs:  CBC: Recent Labs      09/25/18 1237 09/26/18 0413 09/26/18 1815 09/27/18 0616  WBC 10.6* 17.0* 21.8* 16.3*  HGB 9.8* 9.8* 8.0* 6.8*  HCT 29.8* 30.1* 24.9* 20.8*  PLT 476* 514* 331 309    COAGS: Recent Labs    09/03/18 1325 09/07/18 1633  09/11/18 1624 09/12/18 0415 09/27/18 0616  INR 1.03 1.28  --   --  1.50  APTT  --   --  30 30  --     BMP: Recent Labs    09/25/18 0445 09/25/18 2233 09/26/18 0413 09/27/18 0616  NA 132* 133* 134* 133*  K 3.4* 3.9 4.0 4.7  CL 96* 103 100 107  CO2 25 20* 25 18*  GLUCOSE 211* 69* 50* 339*  BUN 11 14 14  75*  CALCIUM 8.3* 8.1* 8.2* 7.3*  CREATININE 0.52* 0.59* 0.67 1.24  GFRNONAA >60 >60 >60 >60  GFRAA >60 >60 >60 >60    LIVER FUNCTION TESTS: Recent Labs    09/21/18 0350 09/22/18 0436 09/25/18 2233 09/26/18 0413  BILITOT 0.3 0.4 0.4 0.5  AST 15 16 15 15   ALT 20 21 19 20   ALKPHOS 64 65 63 68  PROT 5.2* 5.3* 5.9* 5.9*  ALBUMIN 1.6* 1.6* 1.8* 1.8*    TUMOR MARKERS: No results for input(s): AFPTM, CEA, CA199, CHROMGRNA in the last 8760 hours.  Assessment and Plan:  GI bleed. Plan for image-guided mesenteric angiogram with possible embolization. Patient is NPO. Afebrile. He does not take blood thinners. INR 1.50 seconds this AM.  The risks and benefits of embolization were discussed with the patient including, but not limited to bleeding, infection, vascular injury, post operative pain, or contrast induced renal failure. This procedure involves the use of X-rays and because of the nature of the planned procedure, it is possible that we will have prolonged use of X-ray fluoroscopy. Potential radiation risks to you include (but are not limited to) the following: - A slightly elevated risk for cancer several years later in life. This risk is typically less than 0.5% percent. This risk is low in comparison to the normal incidence of human cancer, which is 33% for women and 50% for men according to the [place]. - Radiation  induced injury can include skin redness, resembling a rash, tissue breakdown / ulcers and hair loss (which can be temporary or permanent).  The likelihood of either of these occurring depends on the difficulty of the procedure and whether you are sensitive to radiation due to previous procedures, disease, or genetic conditions.  IF your procedure requires a prolonged use of radiation, you will be notified and given written instructions for further action.  It is your responsibility to monitor the irradiated area for the 2 weeks following the procedure and to notify your physician if you are concerned that you have suffered a radiation induced injury.   All of the patient's questions were answered, patient is agreeable to proceed. Consent signed and in chart.   Thank you for this interesting consult.  I greatly enjoyed meeting Ryan Medina and look forward to participating in their care.  A copy of this report was sent to the requesting provider on this date.  Electronically Signed: Earley Abide, PA-C 09/27/2018, 11:12 AM   I spent a total of 40 Minutes in face to face in clinical consultation, greater than 50% of which was counseling/coordinating care for GI bleed.

## 2018-09-27 NOTE — Progress Notes (Signed)
ABG results reported to RN.  BIPAP order not indicated. [facility] paged for further orders.

## 2018-09-27 NOTE — Progress Notes (Signed)
CRITICAL VALUE ALERT  Critical Value:  Hgb 6.8   Date & Time Notied:  09/27/18 0658  Provider Notified: Kennon Holter NP   Orders Received/Actions taken:  Awaiting orders will continue to monitor.

## 2018-09-27 NOTE — Progress Notes (Signed)
NAME:  Ryan Medina, MRN:  [id number], DOB:  12/03/1960, LOS: 24 ADMISSION DATE:  09/03/2018, CONSULTATION DATE:  09/07/2018 REFERRING MD:  Dr. Reesa Chew, CHIEF COMPLAINT:  GIB/ hypotension   Brief History   26 yoM admitted with LLE pain found to have ischemic right leg and acute popliteal and tibial embolus s/p embolectomy 12/13.  On heparin gtt.  Developed acute RLL hematoma with compartment syndrome requiring fasciotomy 12/15.  Decompensated 12/17 am with hypotension with abd distention and 2 massive bloody stools.  Hgb drop 9.5 -> 5.9.  Getting 2 units PRBC, protonix, and GI consulted.  Moved to ICU for hemodynamic instability.  Had embolization of GDA 09/07/2018.  Transferred out of ICU 12/18.  Transferred back to ICU 1/5 overnight for hypotension and recurrent anemia.  History of present illness   58 year old male with PMH Of DMT2, systolic HF EF ~93%, HTN, cocaine abuse who was recently hospitalized with DKA returns on 12/13 with left lower extremity pain found to have acute popliteal and tibial embolus with ischemic leg.  Underwent emergent embolectomy on 12/13 and placed on heparin.  On 12/15, he was noted to have bleeding from his right leg found to have development of large hematoma with compartment syndrome and taken emergently back to OR for fasciotomy with wound vac x 2 left in place.  Overnight, patient not to be more lethargic and hypotension.  Found to have acute drop in Hgb from 9.5 to 5.9.  Heparin gtt stopped this morning, given NS bolus and transfused with 1 unit PRBC.  Patient then had 2 large bloody bowel movements, noted to have tender distended abd, and spitting up coffee ground secretions.  GI consulted.  PCCM consulted for acute hemodynamic instability.   Had embolization of GDA 09/07/18 and EGD with hemospray to ulcer in duodenum 12/19.  Had CT scan 12/21 that showed new pancreatic mass.  Underwent EGD with EUS 12/26 and cytology returned positive for pancreatic  adenocarcinoma.  Past Medical History  DMT2, systolic HF EF ~71%, DCM, HTN, cocaine abuse, pancreatic adenocarcinoma (new diagnosis as of 09/17/19).  Significant Hospital Events   12/13 Admitted, right popliteal and tibial embolectomy with patch angioplasty 12/15 OR for evacuation of hematoma and lymphocele right leg, 4 compartment fasciotomy, placement of vac 12/17 GI and IR consult.  Taken to IR for GDA embolization 12/19 EGD with hemospray applied to duodenal ulcer 12/20 OR for closure of medial and lateral fasciotomy incisions 12/21 CT A/P with pancreatic mass 12/23 CIR consult 12/26 EUS that showed pancreatic adenocarcinoma 12/30 oncology consult 1/2 radiation therapy feels XRT would not be of much benefit 1/4 IR consulted for port a cath placement 1/5 transferred back to ICU for hypotension and recurrent anemia. Since admit, had 14u PRBC's with additional 4 ordered 1/6 CT pancreas ordered by oncology   Consults:  Vascular GI PCCM Oncology / radiation onc IR Palliative  Procedures:  12/13 LLE embolectomy  12/15 RLE fasciotomy  09/07/2018 intubation>> 09/07/2018 interventional radiology perform mesenteric angiography and cold embolization of the GDA  Significant Diagnostic Tests:  TTE 12/14 > EF 45-50%, G1DD. CTA PE 12/16 >No evidence of thoracic aortic aneurysm or dissection.  Small bilateral effusions with associated bibasilar consolidation. CTA A/P 12/21 > new pancreatic mass.  Occluded GDA after coil embolization.  Stable aneurysmal disease though has significant increase in mural thrombus within the dilated common iliac arteries bilaterally. MRI abd 12/22 > pancreatic neck mass c/w adenocarcinoma EUS 12/26 > pancreatic adenocarcinoma.  Duodenal ulcer not  c/w malignancy. TEE 12/26 > EF 50-55%, mild MR, no thrombus, negative bubble study. Renal US 1/2 > negative for hydro or acute process. CT pancreas 1/6 >   Micro Data:   Antimicrobials:  Pre op ancef 12/13 and  12/15  Interim history/subjective:  Hypotensive with ABLA overnight, transferred to ICU, started on neo and PRBC transfusion.  Now s/p 16u PRBC since admission.  Repeat Hgb 1/6 pending. Seen by oncology who requests CT pancreas. GI has seen, doubt any role in repeat EGD but may consider to visualize if duodenal ulcer has changed at all.  Pt unrealistic about his diagnosis and prognosis.  Says "Im not dying right now so I guess keep fighting"  Objective   Blood pressure (!) 84/56, pulse (!) 118, temperature 98.8 F (37.1 C), temperature source Oral, resp. rate (!) 25, height 5\' 11"  (1.803 m), weight 66.7 kg, SpO2 94 %.        Intake/Output Summary (Last 24 hours) at 09/27/2018 0812 Last data filed at 09/27/2018 0600 Gross per 24 hour  Intake 1993.41 ml  Output 225 ml  Net 1768.41 ml   Filed Weights   09/03/18 1650 09/16/18 1325  Weight: 66.7 kg 66.7 kg   Examination: General: Adult male, frail, resting in bed, in NAD. Neuro: A&O x 3, though somewhat delirious at times. HEENT: [place]/AT. Sclerae anicteric, EOMI. Cardiovascular: RRR, no M/R/G.  Lungs: Respirations even and unlabored.  CTA bilaterally, No W/R/R. Abdomen: BS x 4, soft, NT/ND.  Musculoskeletal: RLE staples C/D/I.  No gross deformities, no edema.  Skin: Intact, warm, no rashes.  Assessment & Plan:   Newly diagnosed pancreatic adenocarcinoma - not resectable given celiac artery and branches involvement.  - Oncology following, considering transfer to [facility] for initiation of chemo. - CT pancreas ordered 1/6, f/u on results. - Palliative care consulted.  Unfortunately pt is not realistic in terms of his underlying new diagnosis of pancreatic CA and poor prognosis.  Acute hemorraghic shock related to acute GIB - had EGD 12/19 that showed duodenal ulcer, felt to be related to pancreatic mass despite path being negative for malignancy.  Of note, he is now s/p 16u PRBC since admission. - Continue neo as needed to maintain MAP > 65.   Might consider switching to vaso given GIB. - Repeat Hgb pending, transfuse for Hgb < 7. - GI following, doubt any role for repeat EGD but considering to have a re-look at duodenal ulcer in case any further interventions can be performed. - If continues, may need to have IR revisit (? Any role of repeat embolization; though doubtful).  Left popliteal artery embolus s/p embolectomy 12/13. RLE hematoma w/ compartment s/p fasciotomy 12/15 with closure 12/20. - Post op care per vascular surgery. - No heparin given GIB.  DM. - Continue SSI, lantus.  Chronic systolic HF / DCM - complicated by cocaine use. - Continue supportive care.  Cocaine abuse. - Substance abuse counseling.  Pseudohypocalcemia - corrects to 9. - Assess iCa.  Best practice:  Diet: NPO Pain/Anxiety/Delirium protocol: N/A. VAP protocol (if indicated): N/A. DVT prophylaxis: Hold any type of anticoagulation due to GI bleed. GI prophylaxis: PPI BID. Glucose control: SSI, lantus. Mobility: BR Code Status: Limited, no CPR / defibrillation. Family Communication: Updated pt and fiance at bedside 1/6 extensively.  Unfortunately, pt has poor insight into his diagnosis and unrealistic expectations.  Dr. Denton Brick and I both explained that his prognosis is extremely poor and chances of leaving the hospital in same or better condition than  he was previously are zero.  Despite this, he appears to be in denial and states "Im not dying right now so I guess keep fighting".  A palliative care consult has been placed to assist with communications / goals of care. Disposition: ICU.   CC time:  75 minutes including goals of care discussion.   Montey Hora, [facility] Pager: [phone].  If no answer, [phone] 09/27/2018, 11:01 AM

## 2018-09-27 NOTE — Progress Notes (Signed)
Unfortunately, it looks like he has had another episode of GI bleeding.  He began to have melena yesterday.  His hemoglobin dropped quite a bit.  He was moved down to the ICU.  GI was called.  They do not feel that there is anything that they could do to help him out.  I am not sure if this ulcer is from tumor or not.  He had a biopsy of the ulcer back when he first had GI bleeding and the biopsy was negative for any malignancy.  I probably will get another CT scan of his abdomen to see how things look.  We may have to start therapy on him.  I would speak with radiation oncology.  I know they have seen him already.  We may have to consider radiation along with some chemotherapy.  We can certainly utilize either 5-FU or gemcitabine.  His labs right now show a hemoglobin of 6.8 and hematocrit of 20.8.  His platelet count is 309.  His INR is 1.5.  No electrolytes are back yet.  His electrolytes yesterday showed a BUN of 14 creatinine 0.67.  He seemed fairly alert this morning.  His abdomen is slightly distended.  On his exam, his lungs are clear.  Cardiac exam regular rate and rhythm.  Abdomen is soft.  Abdomen is slightly distended.  Bowel sounds are decreased.  There is some hypertympany to percussion.  There is no obvious fluid wave.  Extremities shows the healing fasciotomy incisions.  There is no swelling in the legs.  He has good warmth to his legs.  Again, I do worry that this duodenal ulcer is from the malignancy.  I think it would be nice if GI would scope him again and see if this ulcer has a different appearance.  I do not see a downside to doing an upper endoscopy on him and looking at this ulcer.  May be gastroenterology  can inject this ulcer with some agent.  He may need to be moved over to [place] long ICU if radiation therapy will be started.  [name], MD  [place] [phone]

## 2018-09-27 NOTE — Progress Notes (Signed)
PROGRESS NOTE  Ryan Medina ZSW:[id number] DOB: January 01, 1961 DOA: 09/03/2018 PCP: Patient, No Pcp Per   LOS: 24 days   Brief Narrative / Interim history: Patient is a 58 y.o. male with history of cocaine use, chronic systolic heart failure presented to the hospital on 12/13 with a right ischemic leg secondary to acute popliteal and tibial embolus, underwent embolectomy on 12/13 and subsequently was placed on a heparin drip.  He unfortunately developed a acute right lower leg hematoma with compartment syndrome requiring fasciotomy on 12/15.  Further hospital course was complicated by development of hemorrhagic shock with acute blood loss anemia secondary to a bleeding duodenal ulcer-requiring ICU transfer-intubation for airway protection and IR evaluation with mesenteric angiogram and GDA embolization.  Upon stability he was transferred back to the [facility] on 12/19. Had Further current emesis and melena, on 09/26/2017, H&H dropped, transferred back to ICU on 09/26/2018 due to persistent hypotension requiring pressors ( may need to be transferred to [facility] long hospital for chemoRx)  Subjective:  More sleepy today significant other at bedside, patient declines de-escalation and palliative care involvement at this time----had episodes of hematemesis and melena overnight, became hypotensive, transferred to the ICU on 09/26/2018, currently on IV Neo-Synephrine for pressure support, PCCM provider at bedside   Assessment & Plan: Principal Problem:   Popliteal artery embolus ([place]) Active Problems:   DM (diabetes mellitus), type 2, uncontrolled ([place])   Essential hypertension   Dilated cardiomyopathy ([place])   Chest pain   AAA (abdominal aortic aneurysm) ([place])   Ischemic foot   Right leg claudication ([place])   PVD (peripheral vascular disease) ([place])   Hemorrhagic shock ([place])   Nontraumatic ischemic infarction of muscle of right lower leg   Upper GI bleed   Cocaine abuse ([place])   Acute  blood loss anemia   Chronic pain syndrome   Duodenal ulcer with hemorrhage   Pancreatic adenocarcinoma ([place])   Goals of care, counseling/discussion   Principal Problem 1)Right ischemic leg secondary to popliteal artery/tibial artery embolism-s/p embolectomy on 32/20/25 complicated by right lower extremity hematoma with compartment syndrome requiring fasciotomy on 09/05/18, Source of embolization unclear, required fasciotomy on 09/05/2018 by vascular surgery status post closure of fasciotomy on 09/10/2018.  His course was complicated by large hematoma in the right leg along with significant life-threatening GI bleed. Case discussed with vascular surgeon Dr. Monica Martinez who wants to continue anticoagulation as much as possible.   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  Cardiology was consulted for TEE verbally told no clots, EF 65%, negative for PFO. As of 09/27/2018 TEE from 09/16/18 not yet finalized   2)Upper GI bleeding with hemorrhagic shock and acute blood loss anemia:   He also underwent EUS on 09/16/2018 which showed +ve Duodenal Ulcer which was still raw--- persistent hypotension/hemodynamic instability due to acute GI bleed----continue IV Neo-Synephrine, IR consult for possible embolization, CT on 09/27/18 show no active bleeding. Tumor shown on endo to involve duod ulcer, celiac axis and SMA. Prior GDA embo, IR will like to   hold off angio pending discussion with [facility] re radiation therapy options.  Continue IV Protonix and IV octreotide   3)Pancreatic adenocarcinoma new diagnosis this admission--- 12/26 EUS and FNA confirmed adenocarcinoma.  Tumor invades celiac artery and duodenum.       MRI noted & confirms mass, CA 19.9 was elevated,  underwent EUS with biopsy on 09/16/2018, biopsy consistent with adenocarcinoma.    Per Dr. Marin Olp- may need to  be transferred to [facility] long hospital for chemoRx)Dr Ennever mentions initiating 5- FU or Gemcitabine.   Also mentions possible XRT.  However Dr Sondra Come says tumor not ammenable to XRT and not surgically resectable.  Marland Kitchen   4)Acute on chronic anemia secondary to ABL anemia/current GI bleed--- transfused PRBCs x 17 total,  Hgb 8 >> 6.8.   2 additional (will total 19 units of PRBCs this admission) PRBCs ordered this AM.   5) DM--Last A1c on 08/23/18 was 18.2.  Labile sugars with episodes of hypoglycemia, hold insulin while n.p.o.  6)AKI-  BUN especially elevated in setting of UGIB, recent renal ultrasound without obstructive uropathy , continue IV fluids continue pressure support   7)Acute hypoxic respiratory failure: Intubated when he developed hemorrhagic shock secondary to GI bleeding, successfully extubated- hypoxia actually resolved  8)Chronic systolic heart failure (EF 45-50% by TTE on 09/04/2018): Reasonably well compensated-follow weights, volume status, electrolytes closely.  9)Cocaine use/tobacco use: Counseled about quitting.  10)3 cm AAA: Stable for follow-up in the outpatient setting  Disposition--- given recurrent GI bleed requiring frequent transfusions, given hemodynamic instability with persistent hypotension and tachycardia requiring IV Neo-Synephrine for pressor support patient transferred to ICU, PCCM input appreciated--patient declines palliative care options,.. Patient wants aggressive treatment,.... Patient will be transferred to [facility] team...Marland KitchenMarland Kitchen PCCM may recall hospitalist service when patient is off pressors and more stable, or if patient and family decided to de-escalate care  Scheduled Meds: . sodium chloride   Intravenous Once  . atorvastatin  20 mg Oral QHS  . chlorhexidine gluconate (MEDLINE KIT)  15 mL Mouth Rinse BID  . dronabinol  5 mg Oral BID AC  . insulin aspart  0-9 Units Subcutaneous Q4H  . insulin glargine  6 Units Subcutaneous QHS  . lidocaine  15 mL Oral Once  . LORazepam  1 mg Intravenous Once  . mouth rinse  15 mL Mouth Rinse BID  . pantoprazole  40 mg  Oral BID  . tamsulosin  0.4 mg Oral QHS   Continuous Infusions: .  ceFAZolin (ANCEF) IV Stopped (09/27/18 0901)  . octreotide  (SANDOSTATIN)    IV infusion 50 mcg/hr (09/27/18 1400)  . pantoprozole (PROTONIX) infusion 8 mg/hr (09/27/18 1400)  . phenylephrine (NEO-SYNEPHRINE) Adult infusion 10 mcg/min (09/27/18 1400)   PRN Meds:.alum & mag hydroxide-simeth, ipratropium-albuterol, LORazepam, morphine injection, [DISCONTINUED] ondansetron **OR** ondansetron (ZOFRAN) IV, oxyCODONE, polyethylene glycol, senna-docusate, sodium chloride flush, zolpidem  DVT prophylaxis: SCDs Code Status: Partial code Family Communication: Family at bedside  Procedures:   2D echo:  12/17>>Mesenteric angiogram and coil embolization of the GDA  12/17>>EGD  12/15>> 1.Evacuation hematoma right leg 2.Evacuation of lymphocele right leg 3.4 compartment fasciotomy 4.Placement of VAC (medial and lateral)  12/13>> 1.Ultrasound-guided access to the left common femoral artery 2.Aortogram with bilateral iliac arteriogram 3.Selective catheterization of the right external iliac artery with right lower extremity runoff  MRI Abd.  Pancreatic mass.   TTE - Left ventricle: Wall thickness was increased in a pattern of mild LVH. Systolic function was mildly reduced. The estimated ejection fraction was in the range of 45% to 50%. Wall motion was normal; there were no regional wall motion abnormalities. Dopplerparameters are consistent with abnormal left ventricular relaxation (grade 1 diastolic dysfunction). - Aortic valve: There was trivial regurgitation. - Mitral valve: Mildly calcified annulus. There was mild regurgitation. - Right atrium: Central venous pressure (est): 3 mm Hg. - Tricuspid valve: There was mild regurgitation. - Pulmonary arteries: Systolic pressure could not be accurately estimated. - Pericardium, extracardiac:  There was no pericardial effusion.  Antimicrobials:  None     Objective: Vitals:   09/27/18 1200 09/27/18 1215 09/27/18 1300 09/27/18 1400  BP: (!) 88/64  96/72 (!) 107/57  Pulse: 98 (!) 105 (!) 112 (!) 105  Resp: 19 18 19  (!) 26  Temp: 98.9 F (37.2 C) 98.6 F (37 C)    TempSrc:  Oral    SpO2: 100% 100% 100% 99%  Weight:      Height:        Intake/Output Summary (Last 24 hours) at 09/27/2018 1443 Last data filed at 09/27/2018 1400 Gross per 24 hour  Intake 3060.85 ml  Output 1000 ml  Net 2060.85 ml   Filed Weights   09/03/18 1650 09/16/18 1325  Weight: 66.7 kg 66.7 kg    Examination:   Physical Exam  Patient is examined daily including today on 09/27/2018  , exams remain the same as of yesterday except that has changed   Gen:-Sleepy but awakens easily,  In no apparent distress  HEENT:- [place].AT, No sclera icterus Neck-Supple Neck,No JVD,.  Lungs-fair air movement bilaterally, no wheezing CV- S1, S2 normal Abd-  +ve B.Sounds, Abd Soft, mild epigastric tenderness, no rebound or guarding    Extremity/Skin:- 1 +  edema,   Surgical staples right lower extremity medial and lateral aspects Psych-affect is appropriate, oriented x3 Neuro-no new focal deficits, no tremors   CBC: Recent Labs  Lab 09/25/18 0445 09/25/18 1237 09/26/18 0413 09/26/18 1815 09/27/18 0616  WBC 9.9 10.6* 17.0* 21.8* 16.3*  HGB 10.2* 9.8* 9.8* 8.0* 6.8*  HCT 30.6* 29.8* 30.1* 24.9* 20.8*  MCV 84.3 83.5 87.0 88.0 85.6  PLT 519* 476* 514* 331 258   Basic Metabolic Panel: Recent Labs  Lab 09/23/18 0433 09/24/18 0746 09/25/18 0445 09/25/18 2233 09/26/18 0413 09/27/18 0616  NA 133* 132* 132* 133* 134* 133*  K 4.5 3.9 3.4* 3.9 4.0 4.7  CL 100 94* 96* 103 100 107  CO2 21* 27 25 20* 25 18*  GLUCOSE 251* 251* 211* 69* 50* 339*  BUN 33* 6 11 14 14  75*  CREATININE 1.86* 0.51* 0.52* 0.59* 0.67 1.24  CALCIUM 8.2* 8.3* 8.3* 8.1* 8.2* 7.3*  MG 1.6*  --   --   --  1.7  --    GFR: Estimated Creatinine Clearance: 62 mL/min (by C-G formula based on SCr of  1.24 mg/dL). Liver Function Tests: Recent Labs  Lab 09/21/18 0350 09/22/18 0436 09/25/18 2233 09/26/18 0413  AST 15 16 15 15   ALT 20 21 19 20   ALKPHOS 64 65 63 68  BILITOT 0.3 0.4 0.4 0.5  PROT 5.2* 5.3* 5.9* 5.9*  ALBUMIN 1.6* 1.6* 1.8* 1.8*   CBG: Recent Labs  Lab 09/26/18 1310 09/26/18 1636 09/26/18 2253 09/27/18 0801 09/27/18 1105  GLUCAP 73 131* 288* 338* 147*   Urine analysis:    Component Value Date/Time   COLORURINE YELLOW 09/18/2018 1152   APPEARANCEUR HAZY (A) 09/18/2018 1152   LABSPEC 1.011 09/18/2018 1152   PHURINE 6.0 09/18/2018 1152   GLUCOSEU NEGATIVE 09/18/2018 1152   HGBUR NEGATIVE 09/18/2018 1152   BILIRUBINUR NEGATIVE 09/18/2018 1152   KETONESUR NEGATIVE 09/18/2018 1152   PROTEINUR NEGATIVE 09/18/2018 1152   UROBILINOGEN 4.0 (H) 12/05/2016 0921   NITRITE NEGATIVE 09/18/2018 1152   LEUKOCYTESUR SMALL (A) 09/18/2018 [place], MD [facility] If 7PM-7AM, please contact night-coverage [email or web address] Password [facility] 09/27/2018, 2:43 PM

## 2018-09-27 NOTE — Progress Notes (Signed)
Hypoglycemic Event  CBG: 36 at 1528  Treatment: 50 mL D50 IV  Symptoms: lethargic  Follow-up CBG: Time: 1555 CBG Result: 119  Possible Reasons for Event: NPO status; metabolic instability  Comments/MD notified: PCCM MD Elsworth Soho notified.    Charlyne Quale Natanael Saladin

## 2018-09-27 NOTE — Progress Notes (Addendum)
Daily Rounding Note  09/27/2018, 8:21 AM  LOS: 24 days   SUBJECTIVE:   Chief complaint:     Hypotensive to 70s/50s.  Tachy to 120s.   Hgb 8 to 6.8 despite 2 PRBCs.  2 more units ordered Dr Marin Olp ordered CT pancreas to assess for duodenal invasion to pancreas  Pt last vomited dark blood last night, several melenic stools overnight.  denises abd pain and nausea currently.  Feels sleepy  OBJECTIVE:         Vital signs in last 24 hours:    Temp:  [97.6 F (36.4 C)-98.8 F (37.1 C)] 98.8 F (37.1 C) (01/06 0700) Pulse Rate:  [104-129] 118 (01/06 0800) Resp:  [20-41] 23 (01/06 0800) BP: (64-99)/(44-76) 84/59 (01/06 0700) SpO2:  [76 %-100 %] 100 % (01/06 0800) Last BM Date: 09/27/18 Filed Weights   09/03/18 1650 09/16/18 1325  Weight: 66.7 kg 66.7 kg   General: looks chronically ill   Heart: tachy, regular Chest: clear bil.  No dyspnea Abdomen: NT, thin, BS normal but hypoactive.    Extremities: no CCE.   Neuro/Psych:  Appropriate, follows commands.    Intake/Output from previous day: 01/05 0701 - 01/06 0700 In: 1993.4 [P.O.:360; I.V.:765; AJGOT:157; IV Piggyback:200.5] Out: 225 [Urine:225]  Lab Results: Recent Labs    09/26/18 0413 09/26/18 1815 09/27/18 0616  WBC 17.0* 21.8* 16.3*  HGB 9.8* 8.0* 6.8*  HCT 30.1* 24.9* 20.8*  PLT 514* 331 309   BMET Recent Labs    09/25/18 2233 09/26/18 0413 09/27/18 0616  NA 133* 134* 133*  K 3.9 4.0 4.7  CL 103 100 107  CO2 20* 25 PENDING  GLUCOSE 69* 50* 339*  BUN 14 14 75*  CREATININE 0.59* 0.67 1.24  CALCIUM 8.1* 8.2* 7.3*   LFT Recent Labs    09/25/18 2233 09/26/18 0413  PROT 5.9* 5.9*  ALBUMIN 1.8* 1.8*  AST 15 15  ALT 19 20  ALKPHOS 63 68  BILITOT 0.4 0.5   PT/INR Recent Labs    09/27/18 0616  LABPROT 17.9*  INR 1.50    Studies/Results: Dg Chest Port 1 View  Result Date: 09/27/2018 CLINICAL DATA:  Shortness of breath. Respiratory  distress. EXAM: PORTABLE CHEST 1 VIEW COMPARISON:  09/18/2018. Chest CT 09/06/2018 FINDINGS: Lower lung volumes from prior exam. Mild cardiomegaly with unchanged mediastinal contours. Slight worsening patchy opacity at the left lung base. Patient is rotated. No pneumothorax or large pleural effusion. IMPRESSION: Slight worsening patchy opacity at the left lung base, may be atelectasis or pneumonia. Electronically Signed   By: Keith Rake M.D.   On: 09/27/2018 02:31   Scheduled Meds: . sodium chloride   Intravenous Once  . atorvastatin  20 mg Oral QHS  . chlorhexidine gluconate (MEDLINE KIT)  15 mL Mouth Rinse BID  . dronabinol  5 mg Oral BID AC  . furosemide  20 mg Intravenous Once  . insulin aspart  0-9 Units Subcutaneous Q4H  . insulin glargine  6 Units Subcutaneous QHS  . lidocaine  15 mL Oral Once  . mouth rinse  15 mL Mouth Rinse BID  . pantoprazole  40 mg Oral BID  . tamsulosin  0.4 mg Oral QHS   Continuous Infusions: .  ceFAZolin (ANCEF) IV    . octreotide  (SANDOSTATIN)    IV infusion 50 mcg/hr (09/27/18 0800)  . pantoprozole (PROTONIX) infusion 8 mg/hr (09/27/18 0800)  . 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  Recurrent bleeding despite previous EGD/hemospray 12/17, GDA  Embolization 12/17.    Currently on oral BID Protonix and IV Octreotide though both confer limited benefit given malignant (not by ulcer biopsy but by anatomic appearance at EUS) and not acid/peptic etiology of ulcer.   EGD and repeat hemospray unlikely to confer anything other than short term benefit.  Repeat embolization risky in terms of tissue ischemia/necrosis.  EGD might help in terms of  seeing if there is any improvement (doubtful) in appearance of ulcer. Though ulcer appearance on 12/26 was "deeply cratered, friable, intermittently oozing blood, firm".  However biopsy did not confirm malignancy.     *    Pancreatic cancer, 12/26 EUS and FNA confirmed adenocarcinoma.  Tumor invades celiac artery and duodenum.     Dr Marin Olp mentions initiating 5- FU or Gemcitabine.  Also mentions possible XRT.  However Dr Sondra Come says tumor not ammenable to XRT and not surgically resectable.  .   *   ABL anemia. Transfused PRBCs x 17 total, latest 2 on 12/17.  Hgb 8 >> 6.8.   2 additional (will total 19) PRBCs ordered this AM.  *   Extensive right LE blood clot.  S/p embolectomies and fasciotomies.  Not a candidate for anti-coagulation due to GI bleeds.    *    DM, glucose 339.    *   AKI.  BUN especially elevated in setting of UGIB.     PLAN   *  Pt declines Palliative care [place] consult despite long d/w CCM.     *   CT pancreas.  Transfuse 2 more PRBCs.       Ryan Medina  09/27/2018, 8:21 AM Phone [phone]     Egan Attending   I have taken an interval history, reviewed the chart and examined the patient. I agree with the Advanced Practitioner's note, impression and recommendations.    CT shows gastric distention PE - flat abdomen Awaiting repeat embolization attempt. Appreciate IR help.  Not vomiting - if does would place NG tube  Gatha Mayer, MD, [facility] 09/27/2018 2:54 PM Pager [phone]

## 2018-09-28 ENCOUNTER — Encounter (HOSPITAL_COMMUNITY): Payer: Self-pay | Admitting: Primary Care

## 2018-09-28 ENCOUNTER — Inpatient Hospital Stay (HOSPITAL_COMMUNITY): Payer: Medicaid Other

## 2018-09-28 DIAGNOSIS — D62 Acute posthemorrhagic anemia: Secondary | ICD-10-CM

## 2018-09-28 DIAGNOSIS — D490 Neoplasm of unspecified behavior of digestive system: Secondary | ICD-10-CM

## 2018-09-28 DIAGNOSIS — K3189 Other diseases of stomach and duodenum: Secondary | ICD-10-CM

## 2018-09-28 DIAGNOSIS — C259 Malignant neoplasm of pancreas, unspecified: Secondary | ICD-10-CM | POA: Diagnosis present

## 2018-09-28 DIAGNOSIS — Z515 Encounter for palliative care: Secondary | ICD-10-CM

## 2018-09-28 DIAGNOSIS — C251 Malignant neoplasm of body of pancreas: Secondary | ICD-10-CM

## 2018-09-28 DIAGNOSIS — Z7189 Other specified counseling: Secondary | ICD-10-CM

## 2018-09-28 LAB — BASIC METABOLIC PANEL
Anion gap: 6 (ref 5–15)
BUN: 44 mg/dL — ABNORMAL HIGH (ref 6–20)
CO2: 22 mmol/L (ref 22–32)
Calcium: 7.4 mg/dL — ABNORMAL LOW (ref 8.9–10.3)
Chloride: 107 mmol/L (ref 98–111)
Creatinine, Ser: 0.56 mg/dL — ABNORMAL LOW (ref 0.61–1.24)
GFR calc Af Amer: >60 mL/min (ref >60)
GFR calc non Af Amer: >60 mL/min (ref >60)
Glucose, Bld: 186 mg/dL — ABNORMAL HIGH (ref 70–99)
POTASSIUM: 3.9 mmol/L (ref 3.5–5.1)
Sodium: 135 mmol/L (ref 135–145)

## 2018-09-28 LAB — GLUCOSE, CAPILLARY
Glucose-Capillary: 111 mg/dL — ABNORMAL HIGH (ref 70–99)
Glucose-Capillary: 113 mg/dL — ABNORMAL HIGH (ref 70–99)
Glucose-Capillary: 157 mg/dL — ABNORMAL HIGH (ref 70–99)
Glucose-Capillary: 198 mg/dL — ABNORMAL HIGH (ref 70–99)
Glucose-Capillary: 252 mg/dL — ABNORMAL HIGH (ref 70–99)
Glucose-Capillary: 283 mg/dL — ABNORMAL HIGH (ref 70–99)
Glucose-Capillary: 31 mg/dL — CL (ref 70–99)

## 2018-09-28 LAB — CBC
HCT: 22 % — ABNORMAL LOW (ref 39.0–52.0)
HCT: 27.4 % — ABNORMAL LOW (ref 39.0–52.0)
Hemoglobin: 7.5 g/dL — ABNORMAL LOW (ref 13.0–17.0)
Hemoglobin: 9 g/dL — ABNORMAL LOW (ref 13.0–17.0)
MCH: 28.8 pg (ref 26.0–34.0)
MCH: 28.7 pg (ref 26.0–34.0)
MCHC: 34.1 g/dL (ref 30.0–36.0)
MCHC: 32.8 g/dL (ref 30.0–36.0)
MCV: 84.6 fL (ref 80.0–100.0)
MCV: 87.3 fL (ref 80.0–100.0)
Platelets: 261 10*3/uL (ref 150–400)
Platelets: 223 10*3/uL (ref 150–400)
RBC: 2.6 MIL/uL — ABNORMAL LOW (ref 4.22–5.81)
RBC: 3.14 MIL/uL — ABNORMAL LOW (ref 4.22–5.81)
RDW: 13.9 % (ref 11.5–15.5)
RDW: 14.2 % (ref 11.5–15.5)
WBC: 17.8 10*3/uL — ABNORMAL HIGH (ref 4.0–10.5)
WBC: 14.3 10*3/uL — ABNORMAL HIGH (ref 4.0–10.5)
nRBC: 0 % (ref 0.0–0.2)
nRBC: 0 % (ref 0.0–0.2)

## 2018-09-28 LAB — MAGNESIUM: Magnesium: 1.6 mg/dL — ABNORMAL LOW (ref 1.7–2.4)

## 2018-09-28 LAB — PHOSPHORUS: Phosphorus: 2.5 mg/dL (ref 2.5–4.6)

## 2018-09-28 LAB — CALCIUM, IONIZED: Calcium, Ionized, Serum: 4.9 mg/dL (ref 4.5–5.6)

## 2018-09-28 LAB — PREPARE RBC (CROSSMATCH)

## 2018-09-28 MED ORDER — MAGNESIUM SULFATE 2 GM/50ML IV SOLN
2.0000 g | Freq: Once | INTRAVENOUS | Status: AC
  Administered 2018-09-28: 2 g via INTRAVENOUS
  Filled 2018-09-28: qty 50

## 2018-09-28 MED ORDER — SODIUM CHLORIDE 0.9% IV SOLUTION
Freq: Once | INTRAVENOUS | Status: AC
  Administered 2018-09-28: 14:00:00 via INTRAVENOUS

## 2018-09-28 MED ORDER — SODIUM CHLORIDE 0.9 % IV SOLN
510.0000 mg | Freq: Once | INTRAVENOUS | Status: AC
  Administered 2018-09-28: 510 mg via INTRAVENOUS
  Filled 2018-09-28: qty 17

## 2018-09-28 MED ORDER — GABAPENTIN 300 MG PO CAPS
300.0000 mg | ORAL_CAPSULE | Freq: Three times a day (TID) | ORAL | Status: DC
  Administered 2018-09-28 – 2018-10-03 (×19): 300 mg via ORAL
  Filled 2018-09-28 (×19): qty 1

## 2018-09-28 MED ORDER — FUROSEMIDE 10 MG/ML IJ SOLN
20.0000 mg | Freq: Once | INTRAMUSCULAR | Status: AC
  Administered 2018-09-28: 20 mg via INTRAVENOUS
  Filled 2018-09-28: qty 2

## 2018-09-28 NOTE — Progress Notes (Signed)
Occupational Therapy Treatment Patient Details Name: Ryan Medina MRN: [id number] DOB: 1961/04/23 Today's Date: 09/28/2018    History of present illness Patient is a 58 y.o. male with PMH including but not limited to DM and HTN who presented to the hospital on 12/13 with a right ischemic leg secondary to acute popliteal and tibial embolus, underwent embolectomy on 12/13 and subsequently was placed on a heparin drip.  He unfortunately developed a acute right lower leg hematoma with compartment syndrome requiring fasciotomy on 12/15.  Further hospital course was complicated by development of hemorrhagic shock with acute blood loss anemia secondary to a bleeding duodenal ulcer-requiring ICU transfer-intubation for airway protection and IR evaluation with mesenteric angiogram and GDA embolization.  Upon stability he was transferred back to the triad hospitalist service on 12/19. Pt is now s/p closure of R LE fasciotomy on 09/10/18. 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  Plans to transfer to [facility] for radiation treatment.    OT comments  This 58 yo male admitted with above presents to acute OT with making slow progress due to transfer to ICU since last seen and increased pain in LLE due to not being up and OOB since 09/24/2008. He will continue to benefit from acute OT with follow up at SNF to work back to a independent level.  Follow Up Recommendations  SNF;Supervision/Assistance - 24 hour    Equipment Recommendations  Other (comment)(TBD next venue)       Precautions / Restrictions Precautions Precautions: Fall Precaution Comments: pt is currently hopping on one leg due to R LE pain Restrictions Weight Bearing Restrictions: No RLE Weight Bearing: Weight bearing as tolerated       Mobility Bed Mobility Overal bed mobility: Needs Assistance Bed Mobility: Supine to Sit;Sit to Supine     Supine to sit: Min assist Sit to supine: Supervision   General bed  mobility comments: HOB elevated and pt using railing and min hand held assist to get to sitting EOB.  Pt able to lift his legs back into bed to return to supine. Close supervision for sit to supine transition for safety.   Transfers Overall transfer level: Needs assistance Equipment used: Rolling walker (2 wheeled) Transfers: Sit to/from Stand Sit to Stand: Min guard;+2 safety/equipment         General transfer comment: Min guard assist for safety during transitions, cues for safe hand placement.     Balance Overall balance assessment: Needs assistance Sitting-balance support: Feet supported;No upper extremity supported;Bilateral upper extremity supported;Single extremity supported Sitting balance-Leahy Scale: Good     Standing balance support: Bilateral upper extremity supported;Single extremity supported Standing balance-Leahy Scale: Poor Standing balance comment: needs external support from RW and therapist, especially since he is electively keeping his R LE NWB.                            ADL either performed or assessed with clinical judgement   ADL Overall ADL's : Needs assistance/impaired     Grooming: Oral care;Set up;Sitting Grooming Details (indicate cue type and reason): in chair             Lower Body Dressing: Set up Lower Body Dressing Details (indicate cue type and reason): sitting up in bed and bringing legs up to him Toilet Transfer: Min guard;RW;+2 for safety/equipment Toilet Transfer Details (indicate cue type and reason): bed>around to try to get to sink, but unable to make  it due to pain in RLE>sit in straight chair with arms (propped up leg)>up from chair back around to other side of the bed                 Vision Patient Visual Report: No change from baseline            Cognition Arousal/Alertness: Awake/alert Behavior During Therapy: WFL for tasks assessed/performed Overall Cognitive Status: Within Functional Limits for  tasks assessed                                                General Comments Encouraged hourly extension of his right knee ("stretch out straight in bed") and to preform ankle pumps hourly.     Pertinent Vitals/ Pain       Pain Assessment: Faces Faces Pain Scale: Hurts whole lot Pain Location: R LE, specifically heel, but also diffuse below knee Pain Descriptors / Indicators: Grimacing;Guarding;Sore Pain Intervention(s): Limited activity within patient's tolerance;Monitored during session;Repositioned         Frequency  Min 2X/week        Progress Toward Goals  OT Goals(current goals can now be found in the care plan section)  Progress towards OT goals: Not progressing toward goals - comment(increased pain this session in RLE--hasn't been out of the bed since 09/24/2018)  Acute Rehab OT Goals Patient Stated Goal: decrease leg pain with mobility  Plan Discharge plan remains appropriate    Co-evaluation      Reason for Co-Treatment: Complexity of the patient's impairments (multi-system involvement);For patient/therapist safety;To address functional/ADL transfers PT goals addressed during session: Mobility/safety with mobility;Balance;Proper use of DME;Strengthening/ROM OT goals addressed during session: ADL's and self-care;Strengthening/ROM      AM-PAC OT "6 Clicks" Daily Activity     Outcome Measure   Help from another person eating meals?: None Help from another person taking care of personal grooming?: A Little Help from another person toileting, which includes using toliet, bedpan, or urinal?: A Little Help from another person bathing (including washing, rinsing, drying)?: A Little Help from another person to put on and taking off regular upper body clothing?: A Little Help from another person to put on and taking off regular lower body clothing?: A Little 6 Click Score: 19    End of Session Equipment Utilized During Treatment: Gait  belt;Rolling walker  OT Visit Diagnosis: Unsteadiness on feet (R26.81);Other abnormalities of gait and mobility (R26.89);Muscle weakness (generalized) (M62.81);Pain Pain - Right/Left: Right Pain - part of body: Leg   Activity Tolerance Patient limited by pain   Patient Left in bed;with call bell/phone within reach   Nurse Communication          Time: 9233-0076 OT Time Calculation (min): 24 min  Charges: OT Treatments $Self Care/Home Management : 8-22 mins  [name], OTR/L Acute Rehab Services Pager [phone] Office [phone]      Almon Register 09/28/2018, 11:50 AM

## 2018-09-28 NOTE — Progress Notes (Signed)
Pt discharged from [facility] transport  took pt to [place] ICU with pt's belongings and  Transported pt's Girlfriend.

## 2018-09-28 NOTE — Consult Note (Signed)
Consultation Note Date: 09/28/2018   Patient Name: Ryan Medina  DOB: Jan 23, 1961  MRN: [id number]  Age / Sex: 58 y.o., male  PCP: Patient, No Pcp Per Referring Physician: Rigoberto Noel, MD  Reason for Consultation: Establishing goals of care  HPI/Patient Profile: 58 y.o. male  with past medical history of chronic back pain, high blood pressure and cholesterol, diabetes, dilated cardiomyopathy per echo 2/18, cardiac cath August 2017, history of appendectomy, admitted on 09/03/2018 with popliteal artery embolus, pancreatic cancer.   Clinical Assessment and Goals of Care: Ryan Medina is resting quietly in bed.  He greets me making and mostly keeping eye contact.  He is alert and oriented, calm and cooperative.  Present today at bedside is fianc, Ryan Medina.  We talked about Ryan Medina acute and chronic health concerns.  I share that he is scheduled to go to [facility] for radiation therapy.  Ryan Medina and Ryan Medina both seem knowledgeable and understand the plan of care as described by Dr. Martha Clan and Dr. Joesph Fillers.  We talked about Ryan Medina psychosocial situation.  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  His fiance tells me that he has lost his apartment, they no longer have anywhere to live.  I ask if he hass lost weight, sharing that he looks like he has.  They tell me that he lost from 245 pounds to 135 pounds in 3 months time.  Ryan Medina states that he used to be a strong and hardy man.  We talked about doing the best he can, realizing that there are side effects to every treatment that we give him.  We talk in detail about the plan to transfer to [facility] long for radiation therapy to pancreatic mass in the hope that this will stop bleeding.  At this point, Ryan Medina is agreeable to all cancer treatments as offered.   We talked about healthcare power of  attorney, see below.  CODE STATUS not discussed further today.  HCPOA  NEXT OF KIN -to report tells me that his mother has passed, he did not know his father, his sister and brother are surrogate decision makers.  He tells me that he has a son who is involved in his life at times, but a daughter who is not.   SUMMARY OF RECOMMENDATIONS   At this point, continue to treat the treatable. Continue all cancer treatments as offered.  Code Status/Advance Care Planning:  Limited code, intubation only  Symptom Management:   Per hospitalist, symptoms managed at this time.  Palliative Prophylaxis:   Frequent Pain Assessment, Palliative Wound Care and Turn Reposition  Additional Recommendations (Limitations, Scope, Preferences):  Continue to treat the treatable but no chest compressions  Psycho-social/Spiritual:   Desire for further Chaplaincy support:no  Additional Recommendations: Caregiving  Support/Resources and Education on Hospice  Prognosis:   < 3 months, or less would not be surprising based on over 100 pound weight loss in  3 months, poor functional status, extended hospital stay.    Discharge Planning: To be determined, in hospital death would not be surprising.      Primary Diagnoses: Present on Admission: . Ischemic foot . DM (diabetes mellitus), type 2, uncontrolled ([place]) . Dilated cardiomyopathy ([place]) . AAA (abdominal aortic aneurysm) ([place]) . Essential hypertension . Chest pain . PVD (peripheral vascular disease) ([place])   I have reviewed the medical record, interviewed the patient and family, and examined the patient. The following aspects are pertinent.  Past Medical History:  Diagnosis Date  . Back pain, chronic   . Diabetes ([place])   . Dilated cardiomyopathy ([place])    Echo 2/18: EF 45-50, diff HK, Gr 1 DD, trivial AI/TR  . Goals of care, counseling/discussion 09/24/2018  . HLD (hyperlipidemia)   . Hypertension   . IDDM (insulin dependent diabetes mellitus)  ([place])   . Ruptured lumbar disc    Social History   Socioeconomic History  . Marital status: Single    Spouse name: Not on file  . Number of children: Not on file  . Years of education: Not on file  . Highest education level: Not on file  Occupational History  . Not on file  Social Needs  . Financial resource strain: Not on file  . Food insecurity:    Worry: Not on file    Inability: Not on file  . Transportation needs:    Medical: Not on file    Non-medical: Not on file  Tobacco Use  . Smoking status: Current Every Day Smoker    Packs/day: 0.25    Years: 25.00    Pack years: 6.25    Types: Cigarettes  . Smokeless tobacco: Never Used  . Tobacco comment: 2 per day   Substance and Sexual Activity  . Alcohol use: No    Frequency: Never  . Drug use: No  . Sexual activity: Yes  Lifestyle  . Physical activity:    Days per week: Not on file    Minutes per session: Not on file  . Stress: Not on file  Relationships  . Social connections:    Talks on phone: Not on file    Gets together: Not on file    Attends religious service: Not on file    Active member of club or organization: Not on file    Attends meetings of clubs or organizations: Not on file    Relationship status: Not on file  Other Topics Concern  . Not on file  Social History Narrative  . Not on file   Family History  Problem Relation Age of Onset  . Diabetes Mother   . Heart failure Mother   . Diabetes Sister   . Diabetes Brother    Scheduled Meds: . sodium chloride   Intravenous Once  . sodium chloride   Intravenous Once  . atorvastatin  20 mg Oral QHS  . dronabinol  5 mg Oral BID AC  . furosemide  20 mg Intravenous Once  . gabapentin  300 mg Oral TID  . insulin aspart  0-9 Units Subcutaneous Q4H  . lidocaine  15 mL Oral Once  . LORazepam  1 mg Intravenous Once  . mouth rinse  15 mL Mouth Rinse BID  . pantoprazole  40 mg Oral BID  . tamsulosin  0.4 mg Oral QHS   Continuous Infusions: .  magnesium sulfate 1 - 4 g bolus IVPB 2 g (09/28/18 1215)  . phenylephrine (NEO-SYNEPHRINE) Adult infusion Stopped (09/27/18  1534)   PRN Meds:.alum & mag hydroxide-simeth, ipratropium-albuterol, LORazepam, morphine injection, [DISCONTINUED] ondansetron **OR** ondansetron (ZOFRAN) IV, oxyCODONE, polyethylene glycol, senna-docusate, sodium chloride flush, zolpidem Medications Prior to Admission:  Prior to Admission medications   Medication Sig Start Date End Date Taking? Authorizing Provider  amLODipine (NORVASC) 10 MG tablet Take 1 tablet (10 mg total) by mouth daily. 08/25/18  Yes Ghimire, Henreitta Leber, MD  atorvastatin (LIPITOR) 20 MG tablet Take 1 tablet (20 mg total) by mouth daily. 08/25/18  Yes Ghimire, Henreitta Leber, MD  cyclobenzaprine (FLEXERIL) 10 MG tablet Take 1 tablet (10 mg total) by mouth 2 (two) times daily as needed for muscle spasms. 08/25/18  Yes Ghimire, Henreitta Leber, MD  gabapentin (NEURONTIN) 300 MG capsule Take 1 capsule (300 mg total) by mouth 3 (three) times daily. 08/25/18  Yes Ghimire, Henreitta Leber, MD  insulin aspart (NOVOLOG) 100 UNIT/ML injection Inject 0-15 Units into the skin 3 (three) times daily with meals. 0-15 Units, Subcutaneous, 3 times daily with meals CBG < 70: implement hypoglycemia protocol CBG 70 - 120: 0 units CBG 121 - 150: 2 units CBG 151 - 200: 3 units CBG 201 - 250: 5 units CBG 251 - 300: 8 units CBG 301 - 350: 11 units CBG 351 - 400: 15 units CBG > 400: call MD 08/25/18  Yes Ghimire, Henreitta Leber, MD  insulin detemir (LEVEMIR) 100 UNIT/ML injection Inject 0.3 mLs (30 Units total) into the skin 2 (two) times daily. Patient taking differently: Inject 20-30 Units into the skin See admin instructions. Take 30 units in the morning, and 20 units in the evening 08/25/18  Yes Ghimire, Henreitta Leber, MD  metFORMIN (GLUCOPHAGE) 1000 MG tablet Take 1 tablet (1,000 mg total) by mouth 2 (two) times daily with a meal. 08/25/18  Yes Ghimire, Henreitta Leber, MD  nitroGLYCERIN (NITROSTAT) 0.4  MG SL tablet Place 1 tablet (0.4 mg total) under the tongue every 5 (five) minutes as needed for chest pain. 08/25/18  Yes Ghimire, Henreitta Leber, MD  dicyclomine (BENTYL) 20 MG tablet Take 1 tablet (20 mg total) by mouth 2 (two) times daily. Patient not taking: Reported on 09/05/2018 08/25/18   Jonetta Osgood, MD  glucose blood (AGAMATRIX PRESTO TEST) test strip Check blood sugars prior to meals and at bedtime 08/25/18   Ghimire, Henreitta Leber, MD   Allergies  Allergen Reactions  . Lisinopril Anaphylaxis   Review of Systems  Unable to perform ROS: Other    Physical Exam Vitals signs and nursing note reviewed.     Vital Signs: BP 123/85   Pulse 100   Temp 98 F (36.7 C) (Oral)   Resp (!) 39   Ht 5\' 11"  (1.803 m)   Wt 66.7 kg   SpO2 100%   BMI 20.51 kg/m  Pain Scale: 0-10 POSS *See Group Information*: 1-Acceptable,Awake and alert Pain Score: Asleep   SpO2: SpO2: 100 % O2 Device:SpO2: 100 % O2 Flow Rate: .O2 Flow Rate (L/min): 4 L/min  IO: Intake/output summary:   Intake/Output Summary (Last 24 hours) at 09/28/2018 1231 Last data filed at 09/28/2018 0900 Gross per 24 hour  Intake 2265.61 ml  Output 1545 ml  Net 720.61 ml    LBM: Last BM Date: 09/27/18 Baseline Weight: Weight: 66.7 kg Most recent weight: Weight: 66.7 kg     Palliative Assessment/Data:   Flowsheet Rows     Most Recent Value  Intake Tab  Referral Department  Hospitalist  Unit at Time of Referral  Intermediate Care Unit  Palliative Care Primary Diagnosis  Cancer  Date Notified  09/27/18  Reason for referral  Clarify Goals of Care  Date of Admission  09/26/18  Date first seen by Palliative Care  09/28/18  # of days Palliative referral response time  1 Day(s)  # of days IP prior to Palliative referral  1  Clinical Assessment  Pain Max last 24 hours  Not able to report  Pain Min Last 24 hours  Not able to report  Dyspnea Max Last 24 Hours  Not able to report  Dyspnea Min Last 24 hours  Not able to  report  Psychosocial & Spiritual Assessment  Palliative Care Outcomes      Time In: 1230 Time Out: 1320 Time Total: 50 minutes Greater than 50%  of this time was spent counseling and coordinating care related to the above assessment and plan.  Signed by: Drue Novel, NP   Please contact Palliative Medicine Team phone at [phone] for questions and concerns.  For individual provider: See Shea Evans

## 2018-09-28 NOTE — Progress Notes (Signed)
Mr. Ditullio seems to be doing a little bit better today.  He is more alert.  He did have his abdominal CT scan done yesterday.  He has marked distention of the gastric lumen.  He also has increase in the size of the pancreatic neck mass.  The pancreatic mass now measures 4.5 x 3.7 x 4.7 cm.  There is still no obvious liver metastasis.  There is no adenopathy.  I spoke with radiation oncology yesterday.  They can start treatment on him as soon as he gets over to [facility] hospital.  I would not give concurrent chemotherapy with the radiation as the radiation will be fairly high dose per treatment.  He really needs to gain some weight.  Maybe, he can start to have some but open more substantial.  Again I worry about this gastric distention.  I will know if he needs to have a NG tube placed to help suck out some of that extra air.  His pain seems to be doing pretty well.  He has had no diarrhea.  His labs show a white cell count of 17.8.  Hemoglobin 7.5 and platelet count 261,000.  I will give a dose of iron today.  I think that this gastric distention is going to have to be addressed.  I think that if he does try to eat more substantial food, he will just throw this back up.  I do not know if he needs to have an upper endoscopy done to see if there is any type of blockage that is causing this gastric distention.  Again, transfer him over to [facility].  Let radiation oncology know when he is being transferred so that they can start getting him prepared for radiation therapy.   [name], MD  Psalm 27:1

## 2018-09-28 NOTE — Progress Notes (Signed)
Physical Therapy Treatment Patient Details Name: Ryan Medina MRN: [id number] DOB: Jun 15, 1961 Today's Date: 09/28/2018    History of Present Illness Patient is a 58 y.o. male with PMH including but not limited to DM and HTN who presented to the hospital on 12/13 with a right ischemic leg secondary to acute popliteal and tibial embolus, underwent embolectomy on 12/13 and subsequently was placed on a heparin drip.  He unfortunately developed a acute right lower leg hematoma with compartment syndrome requiring fasciotomy on 12/15.  Further hospital course was complicated by development of hemorrhagic shock with acute blood loss anemia secondary to a bleeding duodenal ulcer-requiring ICU transfer-intubation for airway protection and IR evaluation with mesenteric angiogram and GDA embolization.  Upon stability he was [facility] on [facility]19. Pt is now s/p closure of R LE fasciotomy on 09/10/18. 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  Plans to transfer to [facility] for radiation treatment.     PT Comments    Pt has not been up attempting gait since Friday (09/24/18) and is having significant pain in his right lower leg with having in in a dependent position for gait).  He had to hop as he was unable to tolerate WB through it at this time.  He had limited in room gait with RW.  His fiancee was present for the session and encouraging.  Goals reviewed and updated based on progress.  SNF remains appropriate at discharge at this time, but will be re-assessed as he progresses with his care.  PT will continue to follow acutely for safe mobility progression.  Follow Up Recommendations  SNF     Equipment Recommendations  Rolling walker with 5" wheels;3in1 (PT)    Recommendations for Other Services   NA     Precautions / Restrictions Precautions Precautions: Fall Precaution Comments: pt is currently hopping on one leg due to R LE  pain Restrictions Weight Bearing Restrictions: No RLE Weight Bearing: Weight bearing as tolerated    Mobility  Bed Mobility Overal bed mobility: Needs Assistance Bed Mobility: Supine to Sit;Sit to Supine     Supine to sit: Min assist Sit to supine: Supervision   General bed mobility comments: HOB elevated and pt using railing and min hand held assist to get to sitting EOB.  Pt able to lift his legs back into bed to return to supine. Close supervision for sit to supine transition for safety.   Transfers Overall transfer level: Needs assistance Equipment used: Rolling walker (2 wheeled) Transfers: Sit to/from Stand Sit to Stand: Min guard;+2 safety/equipment         General transfer comment: Min guard assist for safety during transitions, cues for safe hand placement.   Ambulation/Gait Ambulation/Gait assistance: Min assist;+2 safety/equipment Gait Distance (Feet): 10 Feet(x2 with seated rest break due to R LE pain/throbbing.) Assistive device: Rolling walker (2 wheeled) Gait Pattern/deviations: Step-to pattern(hop to)     General Gait Details: Pt electively NWB due to pain in R LE (reporting mostly heel).  Heel checked with no signs of pressure ulcer, pt reports poor sensation in his lower leg.  Chair was emergently pulled up behind him after about 10' of hopping due to significant increase in R Lower leg pain.  He sat for a few minutes, educated on self massaage to help with pain and then hopped back to bed.            Balance Overall balance assessment: Needs assistance  Sitting-balance support: Feet supported;No upper extremity supported;Bilateral upper extremity supported;Single extremity supported Sitting balance-Leahy Scale: Good     Standing balance support: Bilateral upper extremity supported;Single extremity supported Standing balance-Leahy Scale: Poor Standing balance comment: needs external support from RW and therapist, especially since he is electively  keeping his R LE NWB.                             Cognition Arousal/Alertness: Awake/alert Behavior During Therapy: WFL for tasks assessed/performed Overall Cognitive Status: Within Functional Limits for tasks assessed                                           General Comments General comments (skin integrity, edema, etc.): Encouraged hourly extension of his right knee ("stretch out straight in bed") and to preform ankle pumps hourly.       Pertinent Vitals/Pain Pain Assessment: Faces Faces Pain Scale: Hurts whole lot Pain Location: R LE, specifically heel, but also diffuse below knee Pain Descriptors / Indicators: Grimacing;Guarding;Sore Pain Intervention(s): Limited activity within patient's tolerance;Monitored during session;Repositioned           PT Goals (current goals can now be found in the care plan section) Acute Rehab PT Goals Patient Stated Goal: decrease leg pain with mobility PT Goal Formulation: With patient Time For Goal Achievement: 10/12/18 Potential to Achieve Goals: Good Progress towards PT goals: Progressing toward goals(goals reviewed and updated)    Frequency    Min 2X/week      PT Plan Current plan remains appropriate    Co-evaluation PT/OT/SLP Co-Evaluation/Treatment: Yes Reason for Co-Treatment: Complexity of the patient's impairments (multi-system involvement);For patient/therapist safety;To address functional/ADL transfers PT goals addressed during session: Mobility/safety with mobility;Balance;Proper use of DME;Strengthening/ROM OT goals addressed during session: ADL's and self-care;Strengthening/ROM      AM-PAC PT "6 Clicks" Mobility   Outcome Measure  Help needed turning from your back to your side while in a flat bed without using bedrails?: None Help needed moving from lying on your back to sitting on the side of a flat bed without using bedrails?: A Little Help needed moving to and from a bed to a  chair (including a wheelchair)?: A Little Help needed standing up from a chair using your arms (e.g., wheelchair or bedside chair)?: A Little Help needed to walk in hospital room?: A Little Help needed climbing 3-5 steps with a railing? : A Lot 6 Click Score: 18    End of Session Equipment Utilized During Treatment: Gait belt Activity Tolerance: Patient limited by pain Patient left: in bed;with call bell/phone within reach Nurse Communication: Mobility status PT Visit Diagnosis: Other abnormalities of gait and mobility (R26.89);Pain Pain - Right/Left: Right Pain - part of body: Leg     Time: 6237-6283 PT Time Calculation (min) (ACUTE ONLY): 26 min  Charges:  $Gait Training: 8-22 mins                    Pearley Baranek B. Kilo Eshelman, PT, DPT  Acute Rehabilitation [phone] pager #[phone] office   09/28/2018, 11:49 AM

## 2018-09-28 NOTE — Progress Notes (Signed)
Called report to [facility] [employer] pending pt transfer - spoke to [employer] RN  Pt will be transferred per [facility] assigned to [id number] in ICU. Pt's girlfriend has been living at the hospital since Pt's admission because both are homeless - Pt's sister Lavella Lemons says she will give the girlfriend a ride to [place] since she cannot ride in the ambulance

## 2018-09-28 NOTE — Progress Notes (Signed)
Talked to pt. About taking staples out - was premedicated due to the amount of pain that pt says he has sometimes as high as '8" out of scale 1 to 10. Also gets Neurontin for nerve pain. Would only let me take out 10 at a time than gave pt. A rest and came back in 15" to remove 10 more staples gently. Took 3 times times to remove all staples - no bleeding noted. Left open to air - reminded pt and girlfriend that there is no lotion on incision sites for 2 days per MD.

## 2018-09-28 NOTE — Progress Notes (Addendum)
[facility] Attending   I have taken an interval history, reviewed the chart and examined the patient. I agree with the Advanced Practitioner's note, impression and recommendations.   Plans for XRT made.  His stomach was fluid filled and distended on CT - abdomen mildly protuberant.  His duodenal ulcer and the pancreatic cancer are causing this.  He is not vomiting - KUB did not show distended stomach though could miss fluid filled stomach.  I am not inclined to do an endoscopy at this point, would follow clinically.  He is in a very tough spot with no good outcome. XRT may initially make things worse if it creates edema at gastric outlet.  No good or simple options here.will se what happens.  Gatha Mayer, MD, [facility] 09/28/2018 4:58 PM Pager [phone]            Daily Rounding Note  09/28/2018, 10:27 AM  LOS: 25 days   SUBJECTIVE:   Chief complaint:  Bleeding duodenal ulcer.  Pancreatic cancer     Stools dark, tapered to only 2 yesterday, none so far today. Pt denies n/v, abd pain  OBJECTIVE:         Vital signs in last 24 hours:    Temp:  [97.5 F (36.4 C)-99 F (37.2 C)] 98 F (36.7 C) (01/07 0700) Pulse Rate:  [89-122] 97 (01/07 0800) Resp:  [14-39] 39 (01/07 0700) BP: (88-142)/(51-128) 114/95 (01/07 0800) SpO2:  [90 %-100 %] 99 % (01/07 0800) Last BM Date: 09/27/18 Filed Weights   09/03/18 1650 09/16/18 1325  Weight: 66.7 kg 66.7 kg   General: looks ill but alert and about to stand to go for walk with PT   Heart: RRR Chest: diminished BS but clear and no SOB or cough Abdomen: thin, NT, ND.  Active BS  Extremities: staples on right calf, incision clean/dry/intact with no swelling Neuro/Psych:  Oriented x 3.  No gross deficits.  Able to stand independently at bedside.     Intake/Output from previous day: 01/06 0701 - 01/07 0700 In: 2820.6 [P.O.:1070; I.V.:1120.6;  Blood:630] Out: 2000 [Urine:2000]  Intake/Output this shift: Total I/O In: 480 [P.O.:480] Out: 320 [Urine:320]  Lab Results: Recent Labs    09/26/18 1815 09/27/18 0616 09/27/18 1556 09/28/18 0449  WBC 21.8* 16.3*  --  17.8*  HGB 8.0* 6.8* 8.6* 7.5*  HCT 24.9* 20.8* 25.8* 22.0*  PLT 331 309  --  261   BMET Recent Labs    09/26/18 0413 09/27/18 0616 09/28/18 0449  NA 134* 133* 135  K 4.0 4.7 3.9  CL 100 107 107  CO2 25 18* 22  GLUCOSE 50* 339* 186*  BUN 14 75* 44*  CREATININE 0.67 1.24 0.56*  CALCIUM 8.2* 7.3* 7.4*   LFT Recent Labs    09/25/18 2233 09/26/18 0413  PROT 5.9* 5.9*  ALBUMIN 1.8* 1.8*  AST 15 15  ALT 19 20  ALKPHOS 63 68  BILITOT 0.4 0.5   PT/INR Recent Labs    09/27/18 0616  LABPROT 17.9*  INR 1.50    Studies/Results: Dg Chest Port 1 View  Result Date: 09/28/2018 CLINICAL DATA:  Respiratory failure.  Shortness of breath. EXAM: PORTABLE CHEST 1 VIEW COMPARISON:  09/27/2018. FINDINGS: Mediastinum hilar structures are normal. Left mid lung and left base atelectatic changes and infiltrate noted. No pleural effusion or pneumothorax. Stable elevation left hemidiaphragm. Stable mild cardiomegaly. No pulmonary venous congestion. No acute bony abnormality. IMPRESSION: Left mid lung and left  base atelectatic changes and infiltrate noted. Slight progression from prior exam. Electronically Signed   By: Marcello Moores  Register   On: 09/28/2018 05:33   Dg Chest Port 1 View  Result Date: 09/27/2018 CLINICAL DATA:  Shortness of breath. Respiratory distress. EXAM: PORTABLE CHEST 1 VIEW COMPARISON:  09/18/2018. Chest CT 09/06/2018 FINDINGS: Lower lung volumes from prior exam. Mild cardiomegaly with unchanged mediastinal contours. Slight worsening patchy opacity at the left lung base. Patient is rotated. No pneumothorax or large pleural effusion. IMPRESSION: Slight worsening patchy opacity at the left lung base, may be atelectasis or pneumonia. Electronically Signed    By: Keith Rake M.D.   On: 09/27/2018 02:31   Ct Pancreas Abd W/wo  Result Date: 09/27/2018 CLINICAL DATA:  Head of pancreas mass. Increased bleeding. Evaluate for duodenal obstruction EXAM: CT ABDOMEN WITHOUT AND WITH CONTRAST TECHNIQUE: Multidetector CT imaging of the abdomen was performed following the standard protocol before and following the bolus administration of intravenous contrast. CONTRAST:  160mL ISOVUE-370 IOPAMIDOL (ISOVUE-370) INJECTION 76% COMPARISON:  MRI 09/12/2018 FINDINGS: Lower chest: Subsegmental atelectasis and or consolidation noted in the left lung base. Hepatobiliary: 1 cm low-attenuation structure in the left lobe of liver is unchanged, image 26/5. No new focal liver abnormalities. Gallbladder appears normal. No intrahepatic bile duct dilatation. No common bile duct dilatation identified. Pancreas: Neck of pancreas mass is again noted. On today's exam this measures 4.5 x 3.7 by 4.7 cm, image 48/5. Previously 3.6 x 3.6 by 3.2 cm. There is atrophy of the body and tail of pancreas with dilatation of the main duct. Spleen: Normal in size without focal abnormality. Adrenals/Urinary Tract: Adrenal glands are unremarkable. Kidneys are normal, without renal calculi, focal lesion, or hydronephrosis. Stomach/Bowel: There is marked distension of the gastric lumen which is increased from 09/12/2018. The stomach is distended to the level of the distal antrum. No discrete mass identified. There is no abnormal gastric wall thickening or inflammation. No duodenal wall thickening or inflammation identified. There is mild increase caliber of the descending duodenum with a normal caliber transverse duodenum. Visualized small bowel loops are normal in caliber. No pathologic dilatation of the visualized colon. Vascular/Lymphatic: Aortic atherosclerosis. Infrarenal abdominal aortic ectasia measures 3.2 cm in maximum distension. Embolization coils of the gastroduodenal artery noted. The portal vein,  portal venous confluence and splenic vein remain patent. Soft tissue encases and partially narrows the celiac and superior mesenteric artery without complete occlusion. The celiac artery remains patent. Other: There is been interval decrease in subcutaneous and intra-abdominal fat. Musculoskeletal: Lumbar spondylosis. No aggressive lytic or sclerotic bone lesions. IMPRESSION: 1. Interval marked distension of the gastric lumen to the level of the gastric antrum. Etiology indeterminate. No discrete mass identified. 2. Interval increase in size of neck of pancreas mass. Partial involvement of the superior mesenteric artery and celiac artery. 3. Unchanged low-attenuation structure within left lobe of liver. Electronically Signed   By: Kerby Moors M.D.   On: 09/27/2018 10:48   Scheduled Meds: . sodium chloride   Intravenous Once  . atorvastatin  20 mg Oral QHS  . dronabinol  5 mg Oral BID AC  . gabapentin  300 mg Oral TID  . insulin aspart  0-9 Units Subcutaneous Q4H  . lidocaine  15 mL Oral Once  . LORazepam  1 mg Intravenous Once  . mouth rinse  15 mL Mouth Rinse BID  . pantoprazole  40 mg Oral BID  . tamsulosin  0.4 mg Oral QHS   Continuous Infusions: . octreotide  (  SANDOSTATIN)    IV infusion 50 mcg/hr (09/28/18 0929)  . phenylephrine (NEO-SYNEPHRINE) Adult infusion Stopped (09/27/18 1534)   PRN Meds:.alum & mag hydroxide-simeth, ipratropium-albuterol, LORazepam, morphine injection, [DISCONTINUED] ondansetron **OR** ondansetron (ZOFRAN) IV, oxyCODONE, polyethylene glycol, senna-docusate, sodium chloride flush, zolpidem   ASSESMENT:   *  Bleeding duodenal ulcer (suspect malignant from invasive pancreatic CA, despite no cancer on ulcer bx 12/26).  Recurrent bleeding despite previous EGD/hemospray 12/17, GDA  Embolization 12/17.  Bleeding seems to have subsided for now.    Currently on oral BID Protonix and IV Octreotide.     Endoscopic ulcer appearance 12/26 "deeply cratered, friable,  intermittently oozing blood, firm".   IR holding off on repeat angio/embolization pending d/w Dr Sondra Come re XRT.    *   Gastric distention.  Dr Carlean Purl opted not to place NGT given absent n/v.    *    Pancreatic cancer, 12/26 EUS and FNA confirmed adenocarcinoma.  Tumor invades celiac artery, SMA and duodenum.     Plan per Dr Marin Olp is begin XRT, no chemo, once he transfers to [facility].     *   ABL anemia. Transfused PRBCs x 19. Latest on 1/6. Hgb down 1 gm from yest PM post transfusion high.      *   Extensive right LE blood clot.  S/p embolectomies and fasciotomies.  Not a candidate for anti-coagulation due to GI bleeds.    *    DM.    *   AKI.  BUN especially elevated in setting of UGIB.     PLAN   *    Agree with stopping Octreotide.      Azucena Freed  09/28/2018, 10:27 AM Phone [phone]

## 2018-09-28 NOTE — Progress Notes (Signed)
Call to Vascular surgery to come & look to access patient's Rt leg staples for possible OK to remove - stated they would review chart & come to look at leg to make recommendation or not for staple removal

## 2018-09-28 NOTE — Progress Notes (Signed)
   Courtesy visit to patient bedside----chart reviewed, Dr. Antonieta Pert  Note, GI service note, and Dr. Bari Mantis notes reviewed,  discussed with RN in ICU,  Plan for transfer to [facility] reviewed-----currently under PCCM service Dr. Elsworth Soho.... With plans for possible transfer back [facility] on 09/29/2018 if patient remains hemodynamically stable  Octreotide discontinued Hgb drifted down back to 7.5, 1 unit PRBCs to be transfused  Further management as per PCCM today until eventual transfer back to [facility] on 09/29/2017  Roxan Hockey, MD

## 2018-09-28 NOTE — Progress Notes (Addendum)
NAME:  Ryan Medina, MRN:  [id number], DOB:  1961-09-03, LOS: 27 ADMISSION DATE:  09/03/2018, CONSULTATION DATE:  09/07/2018 REFERRING MD:  Dr. Reesa Chew, CHIEF COMPLAINT:  GIB / hypotension   Brief History   42 yoM admitted with LLE pain found to have ischemic right leg and acute popliteal and tibial embolus s/p embolectomy 12/13.  On heparin gtt.  Developed acute RLL hematoma with compartment syndrome requiring fasciotomy 12/15.  Decompensated 12/17 am with hypotension with abd distention and 2 massive bloody stools.  Hgb drop 9.5 -> 5.9.  Getting 2 units PRBC, protonix, and GI consulted.  Moved to ICU for hemodynamic instability.  Had embolization of GDA 09/07/2018.  Transferred out of ICU 12/18.  Transferred back to ICU 1/5 overnight for hypotension and recurrent anemia.  Past Medical History  DMT2, systolic HF EF ~01%, DCM, HTN, cocaine abuse, pancreatic adenocarcinoma (new diagnosis as of 09/17/19).  Significant Hospital Events   12/13 Admitted, right popliteal and tibial embolectomy with patch angioplasty 12/15 OR for evacuation of hematoma and lymphocele right leg, 4 compartment fasciotomy, placement of vac 12/17 GI and IR consult.  Taken to IR for GDA embolization 12/19 EGD with hemospray applied to duodenal ulcer 12/20 OR for closure of medial and lateral fasciotomy incisions 12/21 CT A/P with pancreatic mass 12/23 CIR consult 12/26 EUS that showed pancreatic adenocarcinoma 12/30 oncology consult 1/2 radiation therapy feels XRT would not be of much benefit 1/4 IR consulted for port a cath placement 1/5 transferred back to ICU for hypotension and recurrent anemia. Since admit, had 14u PRBC's with additional 4 ordered 1/6 CT pancreas ordered by oncology   Consults:  Vascular GI PCCM Oncology / radiation onc IR Palliative  Procedures:  12/13 LLE embolectomy  12/15 RLE fasciotomy  09/07/2018 intubation>> 09/07/2018 interventional radiology perform mesenteric angiography  and cold embolization of the GDA  Significant Diagnostic Tests:  TTE 12/14 > EF 45-50%, G1DD. CTA PE 12/16 >No evidence of thoracic aortic aneurysm or dissection.  Small bilateral effusions with associated bibasilar consolidation. CTA A/P 12/21 > new pancreatic mass.  Occluded GDA after coil embolization.  Stable aneurysmal disease though has significant increase in mural thrombus within the dilated common iliac arteries bilaterally. MRI abd 12/22 > pancreatic neck mass c/w adenocarcinoma EUS 12/26 > pancreatic adenocarcinoma.  Duodenal ulcer not c/w malignancy. TEE 12/26 > EF 50-55%, mild MR, no thrombus, negative bubble study. Renal US 1/2 > negative for hydro or acute process. CT pancreas 1/6 > Distention of gastric lumen to antrum, increase in size of pancreatic neck mass with involvement of superior mesenteric and celiac artery  Micro Data:  1/5 Blood cx >> pending  Antimicrobials:  None  Interim history/subjective:  Feels "Ok" this morning aside from leg pain. Discussed increase in size of his pancreatic mass on repeat CT yesterday. He is agreeable to meeting with palliative care.   Objective   Blood pressure (!) 141/80, pulse (!) 112, temperature 97.9 F (36.6 C), temperature source Oral, resp. rate (!) 21, height 5\' 11"  (1.803 m), weight 66.7 kg, SpO2 97 %.        Intake/Output Summary (Last 24 hours) at 09/28/2018 0652 Last data filed at 09/28/2018 0400 Gross per 24 hour  Intake 2745.71 ml  Output 2000 ml  Net 745.71 ml   Filed Weights   09/03/18 1650 09/16/18 1325  Weight: 66.7 kg 66.7 kg    Examination: General: Adult male, frail, resting in bed, in NAD. Neuro: A&O x 3, though somewhat delirious  at times. HEENT: [place]/AT. Sclerae anicteric, EOMI. Cardiovascular: RRR, no M/R/G.  Lungs: Respirations even and unlabored.  CTA bilaterally, No W/R/R. Abdomen: Distended, firm, hypoactive BS  Musculoskeletal: RLE staples C/D/I.  No gross deformities, no edema.  Skin:  Intact, warm, no rashes.  Resolved Hospital Problem list     Assessment & Plan:   Newly diagnosed pancreatic adenocarcinoma: CT yesterday with interval increase size of mass & involvement of SMA & celiac artery. Not resectable.  - Oncology following, plan to transfer to [facility] ICU for radiation tx, holding off on chemo for now - Palliative care consult for goals of care - Unfortunately pt is not realistic in terms of his underlying new diagnosis of pancreatic CA and poor prognosis. - Oncology has requested transfer to [facility] for radiation tx. Stable to transfer to stepdown, will transfer to [facility]. Orders placed.   Acute hemorraghic shock related to acute GIB: EGD 12/19 that showed duodenal ulcer, likely malignant despite path being negative. Of note, he is now s/p 21u PRBC since admission.  -- Off neo, goal MAP > 65. If needed, might consider switching to vaso given GIB. -- Continue BID protonix and octreotide  -- Hgb down again 7.5, transfuse prn goal hemoglobin > 7 -- GI following, no plan for repeat EGD at this time -- IR following, holding off on embolization, plan for radiation tx  ?SBO: Gastric distention on CT, abdomen distended and firm on exam. No N/V or BM.  -- Monitor closely, may need NGT for decompression  -- NPO  Left popliteal artery embolus: s/p embolectomy 12/13. RLE hematoma w/ compartment s/p fasciotomy 12/15 with closure 12/20. -- Post op care per vascular surgery. -- No heparin given GIB.  DM. -- Continue SSI, lantus.  Chronic systolic HF / DCM:  complicated by cocaine use. -- Continue supportive care.  Cocaine abuse. -- Substance abuse counseling.  Pseudohypocalcemia: corrects to 9. -- Assess iCa.  Best practice:  Diet: NPO Pain/Anxiety/Delirium protocol: N/A. VAP protocol (if indicated): N/A. DVT prophylaxis: Hold any type of anticoagulation due to GI bleed. GI prophylaxis: PPI BID. Glucose control: SSI, lantus. Mobility: BR Code Status: Limited,  no CPR / defibrillation. Family Communication: Updated pt and fiance at bedside 1/6 extensively.  Unfortunately, pt has poor insight into his diagnosis and unrealistic expectations.  Dr. Denton Brick and I both explained that his prognosis is extremely poor and chances of leaving the hospital in same or better condition than he was previously are zero.  Despite this, he appears to be in denial and states "Im not dying right now so I guess keep fighting".  A palliative care consult has been placed to assist with communications / goals of care. Disposition: Transfer to [facility] ICU  Labs   CBC: Recent Labs  Lab 09/25/18 1237 09/26/18 0413 09/26/18 1815 09/27/18 0616 09/27/18 1556 09/28/18 0449  WBC 10.6* 17.0* 21.8* 16.3*  --  17.8*  HGB 9.8* 9.8* 8.0* 6.8* 8.6* 7.5*  HCT 29.8* 30.1* 24.9* 20.8* 25.8* 22.0*  MCV 83.5 87.0 88.0 85.6  --  84.6  PLT 476* 514* 331 309  --  703    Basic Metabolic Panel: Recent Labs  Lab 09/23/18 0433  09/25/18 0445 09/25/18 2233 09/26/18 0413 09/27/18 0616 09/28/18 0449  NA 133*   < > 132* 133* 134* 133* 135  K 4.5   < > 3.4* 3.9 4.0 4.7 3.9  CL 100   < > 96* 103 100 107 107  CO2 21*   < > 25  20* 25 18* 22  GLUCOSE 251*   < > 211* 69* 50* 339* 186*  BUN 33*   < > 11 14 14  75* 44*  CREATININE 1.86*   < > 0.52* 0.59* 0.67 1.24 0.56*  CALCIUM 8.2*   < > 8.3* 8.1* 8.2* 7.3* 7.4*  MG 1.6*  --   --   --  1.7  --  1.6*  PHOS  --   --   --   --   --   --  2.5   < > = values in this interval not displayed.   GFR: Estimated Creatinine Clearance: 96.1 mL/min (A) (by C-G formula based on SCr of 0.56 mg/dL (L)). Recent Labs  Lab 09/26/18 0413 09/26/18 1815 09/27/18 0616 09/28/18 0449  WBC 17.0* 21.8* 16.3* 17.8*    Liver Function Tests: Recent Labs  Lab 09/22/18 0436 09/25/18 2233 09/26/18 0413  AST 16 15 15   ALT 21 19 20   ALKPHOS 65 63 68  BILITOT 0.4 0.4 0.5  PROT 5.3* 5.9* 5.9*  ALBUMIN 1.6* 1.8* 1.8*   No results for input(s): LIPASE, AMYLASE  in the last 168 hours. No results for input(s): AMMONIA in the last 168 hours.  ABG    Component Value Date/Time   PHART 7.389 09/27/2018 0206   PCO2ART 25.2 (L) 09/27/2018 0206   PO2ART 62.0 (L) 09/27/2018 0206   HCO3 15.3 (L) 09/27/2018 0206   TCO2 16 (L) 09/27/2018 0206   ACIDBASEDEF 9.0 (H) 09/27/2018 0206   O2SAT 92.0 09/27/2018 0206     Coagulation Profile: Recent Labs  Lab 09/27/18 0616  INR 1.50    Cardiac Enzymes: Recent Labs  Lab 09/25/18 2233  TROPONINI <0.03    HbA1C: Hgb A1c MFr Bld  Date/Time Value Ref Range Status  08/23/2018 03:01 AM 18.2 (H) 4.8 - 5.6 % Final    Comment:    (NOTE) Pre diabetes:          5.7%-6.4% Diabetes:              >6.4% Glycemic control for   <7.0% adults with diabetes   01/30/2018 03:44 AM >15.5 (H) 4.8 - 5.6 % Final    Comment:    (NOTE) **Verified by repeat analysis**         Prediabetes: 5.7 - 6.4         Diabetes: >6.4         Glycemic control for adults with diabetes: <7.0     CBG: Recent Labs  Lab 09/27/18 1528 09/27/18 1555 09/27/18 2004 09/27/18 2331 09/28/18 0429  GLUCAP 36* 119* 203* 183* 157*    Past Medical History  He,  has a past medical history of Back pain, chronic, Diabetes ([place]), Dilated cardiomyopathy ([place]), Goals of care, counseling/discussion (09/24/2018), HLD (hyperlipidemia), Hypertension, IDDM (insulin dependent diabetes mellitus) ([place]), and Ruptured lumbar disc.   Surgical History    Past Surgical History:  Procedure Laterality Date  . ABDOMINAL AORTOGRAM W/LOWER EXTREMITY Right 09/03/2018   Procedure: ABDOMINAL AORTOGRAM W/LOWER EXTREMITY;  Surgeon: Angelia Mould, MD;  Location: [place] [facility];  Service: Cardiovascular;  Laterality: Right;  . ABDOMINAL SURGERY    . APPENDECTOMY    . APPLICATION OF WOUND VAC Right 09/05/2018   Procedure: APPLICATION OF WOUND VAC RIGHT LOWER MEDIAL AND LATERAL FASCIOTOMY SITE;  Surgeon: Angelia Mould, MD;  Location: [place];   Service: Vascular;  Laterality: Right;  . BACK SURGERY    . BIOPSY  09/16/2018   Procedure:  BIOPSY;  Surgeon: Milus Banister, MD;  Location: [place];  Service: Endoscopy;;  . CARDIAC CATHETERIZATION N/A 05/19/2016   Procedure: Left Heart Cath and Coronary Angiography;  Surgeon: Leonie Man, MD;  Location: [place] [facility];  Service: Cardiovascular;  Laterality: N/A;  . EMBOLECTOMY Right 09/03/2018   Procedure: Embolectomy Right Popliteal and Tibial Artery, Bovine Pericardium Patch Angioplasty Right Popliteal Artery;  Surgeon: Angelia Mould, MD;  Location: [place];  Service: Vascular;  Laterality: Right;  . EMBOLECTOMY Right 09/10/2018   Procedure: EMBOLECTOMY RIGHT LOWER EXTREMITY;  Surgeon: Angelia Mould, MD;  [facility];  Service: Vascular;  Laterality: Right;  . ESOPHAGOGASTRODUODENOSCOPY N/A 09/07/2018   Procedure: ESOPHAGOGASTRODUODENOSCOPY (EGD);  Surgeon: Irving Copas., MD;  Location: [place];  Service: Gastroenterology;  Laterality: N/A;  . ESOPHAGOGASTRODUODENOSCOPY (EGD) WITH PROPOFOL N/A 09/16/2018   Procedure: ESOPHAGOGASTRODUODENOSCOPY (EGD) WITH PROPOFOL;  Surgeon: Milus Banister, MD;  Location[facility] ENDOSCOPY;  Service: Endoscopy;  Laterality: N/A;  . EUS N/A 09/16/2018   Procedure: ESOPHAGEAL ENDOSCOPIC ULTRASOUND (EUS) RADIAL;  Surgeon: Milus Banister, MD;  Location: [facility] ENDOSCOPY;  Service: Endoscopy;  Laterality: N/A;  . FASCIOTOMY Right 09/05/2018   Procedure: FOUR COMPARTMENT FASCIOTOMY RIGHT LOWER LEG ;  Surgeon: Angelia Mould, MD;  Location: [place];  Service: Vascular;  Laterality: Right;  . FASCIOTOMY CLOSURE Right 09/10/2018   Procedure: FASCIOTOMY CLOSURE FOUR COMPARTMENT;  Surgeon: Angelia Mould, MD;  Location: [place];  Service: Vascular;  Laterality: Right;  . FINE NEEDLE ASPIRATION  09/16/2018   Procedure: FINE NEEDLE ASPIRATION (FNA) LINEAR;  Surgeon: Milus Banister, MD;  Location: [facility] ENDOSCOPY;   Service: Endoscopy;;  . HEMATOMA EVACUATION Right 09/05/2018   Procedure: EVACUATION HEMATOMA OF RIGHT LOWER EXTREMITY; EVACUATION OF LYMPHOCELE RIGHT LOWER LEG ;  Surgeon: Angelia Mould, MD;  Location: [place];  Service: Vascular;  Laterality: Right;  . IR ANGIOGRAM SELECTIVE EACH ADDITIONAL VESSEL  09/07/2018  . IR ANGIOGRAM SELECTIVE EACH ADDITIONAL VESSEL  09/07/2018  . IR ANGIOGRAM VISCERAL SELECTIVE  09/07/2018  . IR ANGIOGRAM VISCERAL SELECTIVE  09/07/2018  . IR EMBO ART  VEN HEMORR LYMPH EXTRAV  INC GUIDE ROADMAPPING  09/07/2018  . IR US GUIDE VASC ACCESS RIGHT  09/07/2018  . TEE WITHOUT CARDIOVERSION N/A 09/16/2018   Procedure: TRANSESOPHAGEAL ECHOCARDIOGRAM (TEE);  Surgeon: Milus Banister, MD;  Location: [facility] ENDOSCOPY;  Service: Endoscopy;  Laterality: N/A;  if cleared by GI     Social History   reports that he has been smoking cigarettes. He has a 6.25 pack-year smoking history. He has never used smokeless tobacco. He reports that he does not drink alcohol or use drugs.   Family History   His family history includes Diabetes in his brother, mother, and sister; Heart failure in his mother.   Allergies Allergies  Allergen Reactions  . Lisinopril Anaphylaxis     Home Medications  Prior to Admission medications   Medication Sig Start Date End Date Taking? Authorizing Provider  amLODipine (NORVASC) 10 MG tablet Take 1 tablet (10 mg total) by mouth daily. 08/25/18  Yes Ghimire, Henreitta Leber, MD  atorvastatin (LIPITOR) 20 MG tablet Take 1 tablet (20 mg total) by mouth daily. 08/25/18  Yes Ghimire, Henreitta Leber, MD  cyclobenzaprine (FLEXERIL) 10 MG tablet Take 1 tablet (10 mg total) by mouth 2 (two) times daily as needed for muscle spasms. 08/25/18  Yes Ghimire, Henreitta Leber, MD  gabapentin (NEURONTIN) 300 MG capsule Take 1 capsule (300 mg  total) by mouth 3 (three) times daily. 08/25/18  Yes Ghimire, Henreitta Leber, MD  insulin aspart (NOVOLOG) 100 UNIT/ML injection Inject 0-15 Units into  the skin 3 (three) times daily with meals. 0-15 Units, Subcutaneous, 3 times daily with meals CBG < 70: implement hypoglycemia protocol CBG 70 - 120: 0 units CBG 121 - 150: 2 units CBG 151 - 200: 3 units CBG 201 - 250: 5 units CBG 251 - 300: 8 units CBG 301 - 350: 11 units CBG 351 - 400: 15 units CBG > 400: call MD 08/25/18  Yes Ghimire, Henreitta Leber, MD  insulin detemir (LEVEMIR) 100 UNIT/ML injection Inject 0.3 mLs (30 Units total) into the skin 2 (two) times daily. Patient taking differently: Inject 20-30 Units into the skin See admin instructions. Take 30 units in the morning, and 20 units in the evening 08/25/18  Yes Ghimire, Henreitta Leber, MD  metFORMIN (GLUCOPHAGE) 1000 MG tablet Take 1 tablet (1,000 mg total) by mouth 2 (two) times daily with a meal. 08/25/18  Yes Ghimire, Henreitta Leber, MD  nitroGLYCERIN (NITROSTAT) 0.4 MG SL tablet Place 1 tablet (0.4 mg total) under the tongue every 5 (five) minutes as needed for chest pain. 08/25/18  Yes Ghimire, Henreitta Leber, MD  dicyclomine (BENTYL) 20 MG tablet Take 1 tablet (20 mg total) by mouth 2 (two) times daily. Patient not taking: Reported on 09/05/2018 08/25/18   Jonetta Osgood, MD  glucose blood (AGAMATRIX PRESTO TEST) test strip Check blood sugars prior to meals and at bedtime 08/25/18   Jonetta Osgood, MD     Critical care time:      Velna Ochs, M.D. - PGY3 Pager: [phone] 09/28/2018, 6:52 AM

## 2018-09-28 NOTE — Progress Notes (Addendum)
Initial Nutrition Assessment  DOCUMENTATION CODES:   Severe malnutrition in context of chronic illness, Underweight  INTERVENTION:   Recommend TPN per pharmacy due to pt on clear diet inadequate to meet needs, has disstention and may not be able to obtain safe enteral access for tube feeding  Boost Breeze po TID, each supplement provides 250 kcal and 9 grams of protein  NUTRITION DIAGNOSIS:   Severe Malnutrition related to chronic illness(Pancreatic cancer, DM and CHF) as evidenced by severe muscle depletion, severe fat depletion, percent weight loss.  GOAL:   Patient will meet greater than or equal to 90% of their needs   MONITOR:   PO intake, Supplement acceptance, Diet advancement  REASON FOR ASSESSMENT:   LOS    ASSESSMENT:   58 y.o male with PMH: recent dx pancreatic cancer, DM, CHF, crack cocaine use, emboli of legs, smoker. Pt admitted with admitted with DKA and GI bleed.   12/13 embolectomy surgery for acute popliteal and tibial emboli  12/15 fasciotomy for RLE hematoma   12/17 Developed hemorrhagic shock secondary to bleeding duodenal ulcer requiring transfer to ICU for intubation  12/22 MRI showed pancreatic mass  12/26 Endoscopic ultrasound by Gi showed adenocarcinoma  1/5- Transferred back to ICU with hypotension anemia and GI bleed from invading pancreatic mass  Pt alert and in pain when DI entered room. Fiance at bedside. Pt reported prior to admission, pt had been eating 3 meals a day. Pt would not elaborate on types of food. Noted pt homeless.   Pt reported not liking any nutrition supplements while in the hospital. Pt amenable with trying Boost Breeze in [place] to maintain muscle mass while in hospital since he stated he was hungry.    DI encouraged pt to continue eating clear liquid diet foods to try and get some nutrition. Pt weighed in room at 59.1 kg. Per weight encounters, pt has been trending down since last year. 10/01/17 77.1 kg. This is a  23% weight loss in one year which is significant.  Plans for pt to transfer to [facility] to begin radiation. Unclear on whether or not the pt can tolerate diet advancements.   NUTRITION - FOCUSED PHYSICAL EXAM:    Most Recent Value  Orbital Region  Moderate depletion  Upper Arm Region  Severe depletion  Thoracic and Lumbar Region  Severe depletion  Buccal Region  Moderate depletion  Temple Region  Severe depletion  Clavicle Bone Region  Moderate depletion  Clavicle and Acromion Bone Region  Moderate depletion  Scapular Bone Region  Moderate depletion  Dorsal Hand  Moderate depletion  Patellar Region  Severe depletion  Anterior Thigh Region  Severe depletion  Posterior Calf Region  Severe depletion  Edema (RD Assessment)  None  Hair  Reviewed  Eyes  Reviewed  Mouth  Reviewed  Skin  Reviewed  Nails  Reviewed       Diet Order:   Diet Order            Diet clear liquid Room service appropriate? Yes; Fluid consistency: Thin  Diet effective now              EDUCATION NEEDS:   Not appropriate for education at this time  Skin:     Last BM:  1/6- Type 6  Height:   Ht Readings from Last 1 Encounters:  09/28/18 5\' 11"  (1.803 m)    Weight:   Wt Readings from Last 1 Encounters:  09/28/18 59 kg    Ideal Body  Weight:  78 kg  BMI:  Body mass index is 18.14 kg/m.  Estimated Nutritional Needs:   Kcal:  1800-2000 kcal/d  Protein:  95-105 g/d  Fluid:  >/= 1.8 L    Mauricia Area, MS, Dietetic Intern Pager: [phone] After hours Pager: [phone]

## 2018-09-28 NOTE — Progress Notes (Signed)
eLink Physician-Brief Progress Note Patient Name: Ryan Medina DOB: Jun 22, 1961 MRN: [id number]   Date of Service  09/28/2018  HPI/Events of Note  Patient c/o burning and shooting pains in feet. Patient on Neurontin at home.  eICU Interventions  Will order: 1. Neurontin 300 mg PO now and TID.     Intervention Category Major Interventions: Other:  Sommer,Steven Cornelia Copa 09/28/2018, 12:01 AM

## 2018-09-28 NOTE — Progress Notes (Addendum)
[facility]  Subjective  - Feeling well over all.   Objective 123/85 100 98 F (36.7 C) (Oral) (!) 39 100%  Intake/Output Summary (Last 24 hours) at 09/28/2018 1159 Last data filed at 09/28/2018 0900 Gross per 24 hour  Intake 2649.76 ml  Output 1795 ml  Net 854.76 ml    Right LE staples intact with well healed incision Motor intact and foot is well perfused.  Assessment/Planning: Right popliteal and tibial embolectomy Bovine pericardial patch angioplasty popliteal artery and posterior tibial artery  Followed by fasciotomies right LE. The staples may be removed today.    Ryan Medina 09/28/2018 11:59 AM --  I have interviewed the patient and examined the patient. I agree with the findings by the PA. Staples can come out.   Gae Gallop, MD [phone]   Laboratory Lab Results: Recent Labs    09/27/18 0616 09/27/18 1556 09/28/18 0449  WBC 16.3*  --  17.8*  HGB 6.8* 8.6* 7.5*  HCT 20.8* 25.8* 22.0*  PLT 309  --  261   BMET Recent Labs    09/27/18 0616 09/28/18 0449  NA 133* 135  K 4.7 3.9  CL 107 107  CO2 18* 22  GLUCOSE 339* 186*  BUN 75* 44*  CREATININE 1.24 0.56*  CALCIUM 7.3* 7.4*    COAG Lab Results  Component Value Date   INR 1.50 09/27/2018   INR 1.28 09/07/2018   INR 1.03 09/03/2018   No results found for: PTT

## 2018-09-29 ENCOUNTER — Ambulatory Visit: Payer: Self-pay | Admitting: Radiation Oncology

## 2018-09-29 ENCOUNTER — Ambulatory Visit
Admit: 2018-09-29 | Discharge: 2018-09-29 | Disposition: A | Discharge status: Home / Self Care | Payer: Self-pay | Attending: Radiation Oncology | Admitting: Radiation Oncology

## 2018-09-29 DIAGNOSIS — R52 Pain, unspecified: Secondary | ICD-10-CM | POA: Diagnosis present

## 2018-09-29 DIAGNOSIS — C259 Malignant neoplasm of pancreas, unspecified: Secondary | ICD-10-CM

## 2018-09-29 DIAGNOSIS — F1410 Cocaine abuse, uncomplicated: Secondary | ICD-10-CM

## 2018-09-29 DIAGNOSIS — Z515 Encounter for palliative care: Secondary | ICD-10-CM

## 2018-09-29 DIAGNOSIS — G893 Neoplasm related pain (acute) (chronic): Secondary | ICD-10-CM

## 2018-09-29 DIAGNOSIS — R578 Other shock: Secondary | ICD-10-CM

## 2018-09-29 DIAGNOSIS — K922 Gastrointestinal hemorrhage, unspecified: Secondary | ICD-10-CM

## 2018-09-29 DIAGNOSIS — R140 Abdominal distension (gaseous): Secondary | ICD-10-CM | POA: Diagnosis not present

## 2018-09-29 DIAGNOSIS — Z978 Presence of other specified devices: Secondary | ICD-10-CM

## 2018-09-29 DIAGNOSIS — J9690 Respiratory failure, unspecified, unspecified whether with hypoxia or hypercapnia: Secondary | ICD-10-CM

## 2018-09-29 DIAGNOSIS — G894 Chronic pain syndrome: Secondary | ICD-10-CM

## 2018-09-29 DIAGNOSIS — N179 Acute kidney failure, unspecified: Secondary | ICD-10-CM

## 2018-09-29 DIAGNOSIS — E43 Unspecified severe protein-calorie malnutrition: Secondary | ICD-10-CM | POA: Diagnosis not present

## 2018-09-29 DIAGNOSIS — M62261 Nontraumatic ischemic infarction of muscle, right lower leg: Secondary | ICD-10-CM

## 2018-09-29 LAB — BPAM RBC
Blood Product Expiration Date: 202001282359
Blood Product Expiration Date: 202001292359
Blood Product Expiration Date: 202001292359
Blood Product Expiration Date: 202001292359
Blood Product Expiration Date: 202001292359
ISSUE DATE / TIME: 202001052058
ISSUE DATE / TIME: 202001060010
ISSUE DATE / TIME: 202001061011
ISSUE DATE / TIME: 202001061011
ISSUE DATE / TIME: 202001071337
Unit Type and Rh: 7300
Unit Type and Rh: 7300
Unit Type and Rh: 7300
Unit Type and Rh: 7300
Unit Type and Rh: 7300

## 2018-09-29 LAB — TYPE AND SCREEN
ABO/RH(D): B POS
Antibody Screen: NEGATIVE
UNIT DIVISION: 0
Unit division: 0
Unit division: 0
Unit division: 0
Unit division: 0

## 2018-09-29 LAB — CBC
HCT: 26.3 % — ABNORMAL LOW (ref 39.0–52.0)
HCT: 23.7 % — ABNORMAL LOW (ref 39.0–52.0)
HEMATOCRIT: 26.7 % — AB (ref 39.0–52.0)
HEMOGLOBIN: 7.8 g/dL — AB (ref 13.0–17.0)
Hemoglobin: 8.9 g/dL — ABNORMAL LOW (ref 13.0–17.0)
Hemoglobin: 8.6 g/dL — ABNORMAL LOW (ref 13.0–17.0)
MCH: 28.3 pg (ref 26.0–34.0)
MCH: 29.1 pg (ref 26.0–34.0)
MCH: 28.4 pg (ref 26.0–34.0)
MCHC: 33.3 g/dL (ref 30.0–36.0)
MCHC: 32.7 g/dL (ref 30.0–36.0)
MCHC: 32.9 g/dL (ref 30.0–36.0)
MCV: 85 fL (ref 80.0–100.0)
MCV: 88.9 fL (ref 80.0–100.0)
MCV: 86.2 fL (ref 80.0–100.0)
Platelets: 241 10*3/uL (ref 150–400)
Platelets: 232 10*3/uL (ref 150–400)
Platelets: 229 10*3/uL (ref 150–400)
RBC: 3.14 MIL/uL — ABNORMAL LOW (ref 4.22–5.81)
RBC: 2.96 MIL/uL — ABNORMAL LOW (ref 4.22–5.81)
RBC: 2.75 MIL/uL — ABNORMAL LOW (ref 4.22–5.81)
RDW: 14.4 % (ref 11.5–15.5)
RDW: 14.6 % (ref 11.5–15.5)
RDW: 14.2 % (ref 11.5–15.5)
WBC: 14 10*3/uL — ABNORMAL HIGH (ref 4.0–10.5)
WBC: 13.7 10*3/uL — ABNORMAL HIGH (ref 4.0–10.5)
WBC: 12.7 10*3/uL — ABNORMAL HIGH (ref 4.0–10.5)
nRBC: 0 % (ref 0.0–0.2)
nRBC: 0 % (ref 0.0–0.2)
nRBC: 0 % (ref 0.0–0.2)

## 2018-09-29 LAB — BASIC METABOLIC PANEL
Anion gap: 8 (ref 5–15)
BUN: 25 mg/dL — ABNORMAL HIGH (ref 6–20)
CO2: 27 mmol/L (ref 22–32)
Calcium: 7.6 mg/dL — ABNORMAL LOW (ref 8.9–10.3)
Chloride: 99 mmol/L (ref 98–111)
Creatinine, Ser: 0.55 mg/dL — ABNORMAL LOW (ref 0.61–1.24)
GFR calc Af Amer: >60 mL/min (ref >60)
GFR calc non Af Amer: >60 mL/min (ref >60)
Glucose, Bld: 186 mg/dL — ABNORMAL HIGH (ref 70–99)
Potassium: 3.5 mmol/L (ref 3.5–5.1)
Sodium: 134 mmol/L — ABNORMAL LOW (ref 135–145)

## 2018-09-29 LAB — PHOSPHORUS: Phosphorus: 2.5 mg/dL (ref 2.5–4.6)

## 2018-09-29 LAB — GLUCOSE, CAPILLARY
GLUCOSE-CAPILLARY: 64 mg/dL — AB (ref 70–99)
Glucose-Capillary: 179 mg/dL — ABNORMAL HIGH (ref 70–99)
Glucose-Capillary: 161 mg/dL — ABNORMAL HIGH (ref 70–99)
Glucose-Capillary: 334 mg/dL — ABNORMAL HIGH (ref 70–99)
Glucose-Capillary: 210 mg/dL — ABNORMAL HIGH (ref 70–99)
Glucose-Capillary: 263 mg/dL — ABNORMAL HIGH (ref 70–99)

## 2018-09-29 LAB — ABO/RH: ABO/RH(D): B POS

## 2018-09-29 LAB — MAGNESIUM: Magnesium: 1.9 mg/dL (ref 1.7–2.4)

## 2018-09-29 MED ORDER — INSULIN ASPART 100 UNIT/ML ~~LOC~~ SOLN
0.0000 [IU] | Freq: Three times a day (TID) | SUBCUTANEOUS | Status: DC
  Administered 2018-09-29 – 2018-09-30 (×2): 5 [IU] via SUBCUTANEOUS
  Administered 2018-09-30: 3 [IU] via SUBCUTANEOUS
  Administered 2018-09-30: 5 [IU] via SUBCUTANEOUS
  Administered 2018-10-01: 3 [IU] via SUBCUTANEOUS
  Administered 2018-10-01 – 2018-10-02 (×4): 2 [IU] via SUBCUTANEOUS
  Administered 2018-10-02: 3 [IU] via SUBCUTANEOUS
  Administered 2018-10-03: 2 [IU] via SUBCUTANEOUS
  Administered 2018-10-03: 3 [IU] via SUBCUTANEOUS
  Administered 2018-10-04: 2 [IU] via SUBCUTANEOUS
  Administered 2018-10-04: 3 [IU] via SUBCUTANEOUS
  Administered 2018-10-04: 2 [IU] via SUBCUTANEOUS
  Administered 2018-10-05 (×2): 3 [IU] via SUBCUTANEOUS
  Administered 2018-10-05 – 2018-10-06 (×5): 2 [IU] via SUBCUTANEOUS
  Administered 2018-10-07: 3 [IU] via SUBCUTANEOUS
  Administered 2018-10-07: 2 [IU] via SUBCUTANEOUS
  Administered 2018-10-07: 3 [IU] via SUBCUTANEOUS
  Administered 2018-10-07 – 2018-10-08 (×5): 2 [IU] via SUBCUTANEOUS
  Administered 2018-10-09: 1 [IU] via SUBCUTANEOUS
  Administered 2018-10-09: 3 [IU] via SUBCUTANEOUS
  Administered 2018-10-09 (×2): 2 [IU] via SUBCUTANEOUS
  Administered 2018-10-10: 7 [IU] via SUBCUTANEOUS
  Administered 2018-10-10: 2 [IU] via SUBCUTANEOUS
  Administered 2018-10-10: 5 [IU] via SUBCUTANEOUS
  Administered 2018-10-11: 2 [IU] via SUBCUTANEOUS
  Administered 2018-10-11 (×2): 1 [IU] via SUBCUTANEOUS
  Administered 2018-10-12: 3 [IU] via SUBCUTANEOUS
  Administered 2018-10-12 (×2): 1 [IU] via SUBCUTANEOUS
  Administered 2018-10-13: 5 [IU] via SUBCUTANEOUS
  Administered 2018-10-13: 1 [IU] via SUBCUTANEOUS
  Administered 2018-10-13: 2 [IU] via SUBCUTANEOUS
  Administered 2018-10-14: 3 [IU] via SUBCUTANEOUS
  Administered 2018-10-14 – 2018-10-15 (×2): 2 [IU] via SUBCUTANEOUS
  Administered 2018-10-16: 3 [IU] via SUBCUTANEOUS
  Administered 2018-10-16: 1 [IU] via SUBCUTANEOUS
  Administered 2018-10-17 – 2018-10-18 (×5): 2 [IU] via SUBCUTANEOUS
  Administered 2018-10-18: 3 [IU] via SUBCUTANEOUS
  Administered 2018-10-19: 5 [IU] via SUBCUTANEOUS
  Administered 2018-10-19: 7 [IU] via SUBCUTANEOUS
  Administered 2018-10-19: 3 [IU] via SUBCUTANEOUS
  Administered 2018-10-20: 2 [IU] via SUBCUTANEOUS
  Administered 2018-10-20: 1 [IU] via SUBCUTANEOUS
  Administered 2018-10-21: 2 [IU] via SUBCUTANEOUS
  Administered 2018-10-21: 5 [IU] via SUBCUTANEOUS
  Administered 2018-10-21 – 2018-10-22 (×4): 2 [IU] via SUBCUTANEOUS
  Administered 2018-10-22: 5 [IU] via SUBCUTANEOUS
  Administered 2018-10-23: 3 [IU] via SUBCUTANEOUS
  Administered 2018-10-25 – 2018-10-26 (×4): 2 [IU] via SUBCUTANEOUS
  Administered 2018-10-26: 1 [IU] via SUBCUTANEOUS
  Administered 2018-10-26: 3 [IU] via SUBCUTANEOUS
  Administered 2018-10-26: 1 [IU] via SUBCUTANEOUS
  Administered 2018-10-27 (×2): 2 [IU] via SUBCUTANEOUS
  Administered 2018-10-27: 3 [IU] via SUBCUTANEOUS
  Administered 2018-10-28 – 2018-10-29 (×4): 2 [IU] via SUBCUTANEOUS

## 2018-09-29 NOTE — Progress Notes (Signed)
Patient laying in the bed with stable VS on room air. States he hasn't worn the BiPAP in "a while". Denies SOB or difficulty breathing. NAD noted. Alert and oriented at this time and attempted "dancing" while laying in the bed to demonstrate his ability to breath well. BiPAP order changed to prn per RT protocol .

## 2018-09-29 NOTE — Progress Notes (Signed)
Physical Therapy Treatment Patient Details Name: Ryan Medina MRN: [id number] DOB: [birth year] Today's Date: 09/29/2018    History of Present Illness Patient is a 58 y.o. male with PMH including but not limited to DM and HTN who presented to the hospital on 12/13 with a right ischemic leg secondary to acute popliteal and tibial embolus, underwent embolectomy on 12/13 and subsequently was placed on a heparin drip.  He unfortunately developed a acute right lower leg hematoma with compartment syndrome requiring fasciotomy on 12/15.  Further hospital course was complicated by development of hemorrhagic shock with acute blood loss anemia secondary to a bleeding duodenal ulcer-requiring ICU transfer-intubation for airway protection and IR evaluation with mesenteric angiogram and GDA embolization.  Upon stability he was transferred back to the [facility] on 12/19. Pt is now s/p closure of R LE fasciotomy on 09/10/18. 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  Plans to transfer to [facility] for radiation treatment.     PT Comments    Assisted OOB to attempt gait. General Gait Details: used B platform EVA walker for increased support and encourage WBing thru R LE however due to pain pt self NWB and "hopping" "swinging" on EVA    Educated on importance of "some" WBing R LE however pt declined.   Follow Up Recommendations  SNF     Equipment Recommendations  Rolling walker with 5" wheels;3in1 (PT)    Recommendations for Other Services       Precautions / Restrictions Precautions Precautions: Fall Precaution Comments: pt is currently hopping on one leg due to R LE pain Restrictions Weight Bearing Restrictions: No RLE Weight Bearing: Weight bearing as tolerated    Mobility  Bed Mobility Overal bed mobility: Needs Assistance Bed Mobility: Supine to Sit;Sit to Supine     Supine to sit: Supervision Sit to supine: Supervision   General bed mobility  comments: able to self perform as pt tends to use B UE to assist R LE as to "guard" due to pain   Transfers Overall transfer level: Needs assistance Equipment used: Bilateral platform walker Transfers: Sit to/from Stand Sit to Stand: Min guard Stand pivot transfers: Min guard       General transfer comment: pt able to self rise and lower with tendency to self NWB R LE  Ambulation/Gait Ambulation/Gait assistance: Min assist Gait Distance (Feet): 18 Feet Assistive device: Bilateral platform walker(EVA walker )   Gait velocity:  decreased   General Gait Details: used B platform EVA walker for increased support and encourage WBing thru R LE however due to pain pt self NWB and "hopping" "swinging" on EVA    Educated on importance of "some" WBing R LE however pt declined.    Stairs             Wheelchair Mobility    Modified Rankin (Stroke Patients Only)       Balance                                            Cognition Arousal/Alertness: Awake/alert Behavior During Therapy: WFL for tasks assessed/performed Overall Cognitive Status: Within Functional Limits for tasks assessed  Exercises      General Comments        Pertinent Vitals/Pain Pain Assessment: Faces Faces Pain Scale: Hurts whole lot Pain Location: R LE  Pain Descriptors / Indicators: Grimacing;Guarding;Sore Pain Intervention(s): Monitored during session;Repositioned    Home Living                      Prior Function            PT Goals (current goals can now be found in the care plan section) Progress towards PT goals: Progressing toward goals    Frequency    Min 2X/week      PT Plan Current plan remains appropriate    Co-evaluation              AM-PAC PT "6 Clicks" Mobility   Outcome Measure  Help needed turning from your back to your side while in a flat bed without using bedrails?:  None Help needed moving from lying on your back to sitting on the side of a flat bed without using bedrails?: A Little Help needed moving to and from a bed to a chair (including a wheelchair)?: A Little Help needed standing up from a chair using your arms (e.g., wheelchair or bedside chair)?: A Little Help needed to walk in hospital room?: A Little Help needed climbing 3-5 steps with a railing? : A Lot 6 Click Score: 18    End of Session Equipment Utilized During Treatment: Gait belt Activity Tolerance: Patient limited by pain Patient left: in bed;with call bell/phone within reach Nurse Communication: Mobility status PT Visit Diagnosis: Other abnormalities of gait and mobility (R26.89);Pain Pain - Right/Left: Right Pain - part of body: Leg     Time: 1112-1130 PT Time Calculation (min) (ACUTE ONLY): 18 min  Charges:  $Gait Training: 8-22 mins                     Rica Koyanagi  PTA Acute  Rehabilitation Services Pager      [phone] Office      [phone]

## 2018-09-29 NOTE — Progress Notes (Signed)
  Radiation Oncology         ([phone]) [phone] ________________________________  Name: Ryan Medina MRN: [id number]  Date: 09/29/2018  DOB: 11-Jul-1961  SIMULATION AND TREATMENT PLANNING NOTE    ICD-10-CM   1. 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  Identity was confirmed.  All relevant records and images related to the planned course of therapy were reviewed.  The patient freely provided informed written consent to proceed with treatment after reviewing the details related to the planned course of therapy. The consent form was witnessed and verified by the simulation staff.  Then, the patient was set-up in a stable reproducible  supine position for radiation therapy.  CT images were obtained.  Surface markings were placed.  The CT images were loaded into the planning software.  Then the target and avoidance structures were contoured.  Treatment planning then occurred.  The radiation prescription was entered and confirmed.  Then, I designed and supervised the construction of a total of 5 medically necessary complex treatment devices.  I have requested : 3D Simulation  I have requested a DVH of the following structures: pancreatic mass, kidneys, PTV.  I have ordered:dose calc.  PLAN:  The patient will receive 30 Gy in 10 fractions. Treatments to begin early tomorrow morning.   Addendum: earlier today I spoke with Dr. Owens Loffler who did the patient's endoscopy and biopsies. Even though the biopsy from the gastric region did not show malignancy clinical impression is that this large bleeding ulcer was caused by tumor. Since this bleeding is likely caused by tumor,  the patient is receiving palliative radiation therapy in an attempt to control his bleeding issues.  -----------------------------------  Blair Promise, PhD, MD  This document serves as a record of services personally  performed by Gery Pray, MD. It was created on his behalf by Mary-Margaret Loma Messing, a trained medical scribe. The creation of this record is based on the scribe's personal observations and the provider's statements to them. This document has been checked and approved by the attending provider.

## 2018-09-29 NOTE — Progress Notes (Signed)
Patient ID: Ryan Medina, male   DOB: 06/28/1961, 58 y.o.   MRN: [id number]  This NP visited patient at the bedside as a follow up to  yesterday's [place] with Quinn Axe NP with PMT.  Introduced myself, offered emotional support;  patient and his fiancee have no specific questions or concerns at this time.  No pain concerns  Patient is open to all offered and available medical interventions to prolong life.  He tells me he will f/u with Dr Marin Olp for  OP  oncology support.  Discussed with patient the importance of continued conversation with his  family and his  medical providers regarding overall plan of care and treatment options,  ensuring decisions are within the context of the patients values and GOCs.  Questions and concerns addressed     Total time spent on the unit was 15 minutes  Greater than 50% of the time was spent in counseling and coordination of care  Wadie Lessen NP  Palliative Medicine Team Team Phone # [phone] Pager [phone]

## 2018-09-29 NOTE — Progress Notes (Signed)
Mr. Daluz is now over at [facility].  He was transferred last night.  Hopefully, he will start radiation therapy tomorrow.  He is really hungry.  He would like to have regular food.  I told him that I am not the one who will determine when he can start eating regular food I would think this would be up to the ICU doctors and the gastroenterologist.  It does not sound like he is having any bleeding.  I know that gastroenterology saw him today.  I appreciate their attention.  I know that they are trying to be as aggressive as possible.  I know that vascular surgery is also on board to try to help with the arterial thromboembolisms that he has had.  I would think that he will need some form of anticoagulation in the future.  We just cannot put him on anything right now given the bleeding.  Hopefully, with radiation, the bleeding will stop.  In talking with Dr. Sondra Come of radiation oncology, he will use fairly high dose fractions.  As such, we will not use concurrent chemotherapy.  I am not sure how many fractions will be incorporated with the radiation.  His labs are doing okay.  His hemoglobin this morning was 8.6.  This afternoon it was 7.8.  I gave him a dose of iron yesterday.  I suspect he probably will need additional iron in the future.  His blood sugars are still on the high side.  He does seem to be in fairly good spirits.  He will get physical therapy to try to help with his ambulation and try to help get him more independent.  A real big issue that will have to be addressed is the fact that he and his girlfriend/fianc do not have a place to live.  To me, they are not homeless.  They really are going to need to have a place to live upon discharge.  I think he is going to have to stay for the entirety of his radiation.  We will continue to follow along and try to help with any bleeding that he has.   [name], MD  1 John 4:7

## 2018-09-29 NOTE — Progress Notes (Addendum)
[facility] Progress Note  CC:  GI bleed, pancreatic cancer  Subjective: Transferred to [facility] to receive radiation therapy for his pancreatic malignancy.  Should undergo that first treatment this afternoon.  Has not had any bowel movement since his transfer here.  Hemoglobin overall fairly stable this morning at 8.6 g.  Ate some grits this morning with no complaints of nausea, vomiting, abdominal pain.  Is complaining of ongoing pain in right lower extremity.  Objective:  Vital signs in last 24 hours: Temp:  [97.2 F (36.2 C)-98.8 F (37.1 C)] 98.8 F (37.1 C) (01/08 0700) Pulse Rate:  [92-140] 94 (01/08 0500) Resp:  [10-26] 10 (01/08 0000) BP: (94-143)/(54-107) 100/69 (01/08 0500) SpO2:  [94 %-100 %] 99 % (01/08 0500) Weight:  [57.6 kg-59 kg] 57.6 kg (01/07 1914) Last BM Date: 09/27/18 General:  Alert, Well-developed but thin, in NAD Heart:  Regular rate and rhythm; no murmurs Pulm:  CTAB.  No increased WOB. Abdomen:  Soft, non-distended.  BS present.  Non-tender.  Old scars noted on abdomen. Extremities:  Without edema. Neurologic:  Alert and  oriented x4;  grossly normal neurologically. Psych:  Alert and cooperative. Normal mood and affect.  Intake/Output from previous day: 01/07 0701 - 01/08 0700 In: 1245 [P.O.:930; Blood:215; IV Piggyback:100] Out: 9678 [Urine:1720]  Lab Results: Recent Labs    09/28/18 1954 09/29/18 0324 09/29/18 0717  WBC 14.3* 14.0* 13.7*  HGB 9.0* 8.9* 8.6*  HCT 27.4* 26.7* 26.3*  PLT 223 241 232   BMET Recent Labs    09/27/18 0616 09/28/18 0449 09/29/18 0324  NA 133* 135 134*  K 4.7 3.9 3.5  CL 107 107 99  CO2 18* 22 27  GLUCOSE 339* 186* 186*  BUN 75* 44* 25*  CREATININE 1.24 0.56* 0.55*  CALCIUM 7.3* 7.4* 7.6*   PT/INR Recent Labs    09/27/18 0616  LABPROT 17.9*  INR 1.50    Dg Chest Port 1 View  Result Date: 09/28/2018 CLINICAL DATA:  Respiratory failure.  Shortness of breath. EXAM:  PORTABLE CHEST 1 VIEW COMPARISON:  09/27/2018. FINDINGS: Mediastinum hilar structures are normal. Left mid lung and left base atelectatic changes and infiltrate noted. No pleural effusion or pneumothorax. Stable elevation left hemidiaphragm. Stable mild cardiomegaly. No pulmonary venous congestion. No acute bony abnormality. IMPRESSION: Left mid lung and left base atelectatic changes and infiltrate noted. Slight progression from prior exam. Electronically Signed   By: Marcello Moores  Register   On: 09/28/2018 05:33   Dg Abd Portable 1v  Result Date: 09/28/2018 CLINICAL DATA:  Abdominal distension. EXAM: PORTABLE ABDOMEN - 1 VIEW COMPARISON:  Radiograph September 07, 2018. CT scan of September 27, 2018. FINDINGS: The bowel gas pattern is normal. No radio-opaque calculi or other significant radiographic abnormality are seen. IMPRESSION: No definite evidence of bowel obstruction or ileus. Electronically Signed   By: Marijo Conception, M.D.   On: 09/28/2018 13:11   Assessment / Plan: *Bleeding duodenal ulcer(suspect malignant from invasive pancreatic CA, despite no cancer on ulcer bx 12/26). Recurrent bleeding despite previous EGD/hemospray 12/17, GDA Embolization 12/17. Bleeding seems to have subsided for now.   Currently on oral BID Protonix.  (IV Octreotide discontinued 1/7).    Endoscopic ulcer appearance 12/26 "deeply cratered, friable, intermittently oozing blood, firm".  IR holding off on repeat angio/embolization.  *Gastric distention.  Dr Carlean Purl opted not to place NGT given absent n/v/abdominal pain.  ? If this could worsen due to edema that will likely be caused  by radiation therapy.   *Pancreatic cancer, 12/26 EUS and FNA confirmed adenocarcinoma. Tumor invades celiac artery, SMA and duodenum.  Plan per Dr Marin Olp is begin XR, no chemo.  *ABL anemia. Transfused PRBCs x 19. Latest on 1/6. Hgb stable this AM at 8.6 grams.  *Extensive right LE blood clot. S/p embolectomies and fasciotomies.  Not a candidate for anti-coagulation due to GI bleeds.   *DM.   *AKI. BUN especially elevated in setting of UGIB.     LOS: 26 days   Laban Emperor. Zehr  09/29/2018, 11:16 AM  Agree with Ms. Alphia Kava management.  I do not have anything else to offer now - we are available if needed  Gatha Mayer, MD, [facility]

## 2018-09-29 NOTE — Progress Notes (Signed)
PROGRESS NOTE    Ryan Medina  NFA:[id number] DOB: 1960-12-02 DOA: 09/03/2018 PCP: Patient, No Pcp Per   Brief Narrative:  89 yoM admitted with LLE pain found to have ischemic right leg and acute popliteal and tibial embolus s/p embolectomy 12/13. On heparin gtt. Developed acute RLL hematoma with compartment syndrome requiring fasciotomy 12/15. Decompensated 12/17 am with hypotension with abd distention and 2 massive bloody stools. Hgb drop 9.5 ->5.9. Getting 2 units PRBC, protonix, and GI consulted. Moved to ICU for hemodynamic instability. Had embolization of GDA 09/07/2018. Transferred out of ICU 12/18.  Transferred back to ICU 1/5 overnight for hypotension and recurrent anemia.  Patient transfused a total of 22 units packed red blood cells.  Patient seen by oncology and patient transferred to [facility] for radiation therapy to pancreatic mass.   Assessment & Plan:   Principal Problem:   Popliteal artery embolus (HCC) Active Problems:   DM (diabetes mellitus), type 2, uncontrolled ([place])   Essential hypertension   Dilated cardiomyopathy (HCC)   Chest pain   AAA (abdominal aortic aneurysm) (HCC)   Ischemic foot   Right leg claudication (HCC)   PVD (peripheral vascular disease) ([place])   Hemorrhagic shock (HCC)   Nontraumatic ischemic infarction of muscle of right lower leg   Upper GI bleed   Cocaine abuse ([place])   Acute blood loss anemia   Chronic pain syndrome   Duodenal ulcer with hemorrhage   Pancreatic cancer (HCC)   Goals of care, counseling/discussion   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  Noted to be nonresectable.  Patient seen in consultation by oncology and  patient transferred to [facility] long to start radiation treatment.  Chemotherapy on hold for now per oncology.  Oncology patient will likely need to stay in-house for entirety of his radiation treatment.  Palliative care consulted and also following.  Per oncology.  2.  Acute hemorrhagic shock secondary to acute GI bleed Patient noted to be in acute hemorrhagic shock requiring pressors and octreotide which has since been discontinued.  EGD from 09/09/2018 showed bleeding duodenal ulcer likely etiology of patient's bleed with concerns for malignancy from invasive pancreatic cancer despite negative biopsy results.  Patient status post recurrent bleeding despite previous EGD/hemo-spray 09/07/2018.  Patient status post GDA embolization 09/07/2018.  Endoscopic ulcer appearance on 09/16/2018 showed deeply cratered, friable, intermittent oozing blood, firm.  IR holding on repeat angiogram/embolization..  Systolic blood pressures in the low 100s.  Patient currently asymptomatic.  Patient denies any further GI bleed.  Patient status post 22 units packed red blood cells.  Patient status post GDA embolization per IR 09/07/2018.  Hemoglobin currently at 8.6 this morning.  Repeat H&H this afternoon.  Patient to be ordered IV iron per hematology/oncology.  Octreotide has been discontinued.  Continue PPI twice daily.  Will advance diet to a full liquid diet and if hemoglobin remains stable could likely transition to a soft diet tomorrow.  Follow.  3.  Acute blood loss anemia Secondary to problem #2.  Status post 22 units packed red blood cells.  Follow H&H.  4.  Extensive left popliteal artery embolus  status post embolectomy and fasciotomies of right lower extremity.  Staples have been removed.  Patient deemed not a  candidate for anticoagulation due to GI bleed.  Per vascular surgery.  5.??  Small bowel obstruction Gastric distention noted on CT abdomen with some distention.  Patient with no nausea or vomiting.  Patient  states having brown stool.  No need for NG tube at this time.  Advance diet and follow.  6.  Chronic systolic heart failure/diastolic cardiomyopathy Complicated by cocaine use.  Currently stable.  Supportive care.  7.  Cocaine abuse Patient counseled on cessation.  8.  Diabetes mellitus II Hemoglobin A1c 18.2 on 08/23/2018.  Hemoglobin A1c was 161 this morning.  Patient however just on clears.  Diet has been advanced to full liquids.  Likely transition to carb modified diet tomorrow if hemoglobin remains stable and no further bleeding.  Change sliding scale insulin to before meals and at bedtime.  Follow.   DVT prophylaxis: Recent GI bleed not a anticoagulation candidate at this time. Code Status: Partial Family Communication: Updated patient and fianc at bedside. Disposition Plan: Remain in stepdown unit for now  [facility]   12/13 Admitted, right popliteal and tibial embolectomy with patch angioplasty 12/15 OR for evacuation of hematoma and lymphocele right leg, 4 compartment fasciotomy, placement of vac 12/17 GI and IR consult. Taken to IR for GDA embolization 12/19 EGD with hemospray applied to duodenal ulcer 12/20 OR for closure of medial and lateral fasciotomy incisions 12/21 CT A/P with pancreatic mass 12/23 CIR consult 12/26 EUS that showed pancreatic adenocarcinoma 12/30 oncology consult 1/2 radiation therapy feels XRT would not be of much benefit 1/4 IR consulted for port a cath placement 1/5 transferred back to ICU for hypotension and recurrent anemia. Since admit, had 14u PRBC's with additional 4 ordered 1/6 CT pancreas ordered by oncology     Consultants:  Vascular GI PCCM Oncology / radiation onc IR Palliative  Procedures:  TTE 12/14 >EF 45-50%, G1DD. CTA PE 12/16 >No evidence of thoracic aortic aneurysm or dissection.Small bilateral effusions with associated bibasilarconsolidation. CTA A/P 12/21 >new pancreatic mass. Occluded GDA  after coil embolization. Stable aneurysmal disease though has significant increase in mural thrombus within the dilated common iliac arteries bilaterally. MRI abd 12/22 > pancreatic neck mass c/w adenocarcinoma EUS 12/26 >pancreatic adenocarcinoma. Duodenal ulcer not c/w malignancy. TEE 12/26 > EF 50-55%, mild MR, no thrombus, negative bubble study. Renal US 1/2 > negative for hydro or acute process. CT pancreas 1/6 >Distention of gastric lumen to antrum, increase in size of pancreatic neck mass with involvement of superior mesenteric and celiac artery  12/13 LLE embolectomy  12/15 RLE fasciotomy  09/07/2018 intubation>> 09/07/2018 interventional radiology perform mesenteric angiography and cold embolization of the GDA    Antimicrobials:   None   Subjective: Patient laying in bed.  Denies any chest pain or shortness of breath.  Denies any abdominal pain.  No nausea or vomiting.  Tolerating clears.  Asking for food as he states he is hungry.  Denies any further bloody bowel movements.  Complaining of some pain in the right foot however improved than on admission.  Objective: Vitals:   09/29/18 0300 09/29/18 0400 09/29/18 0500 09/29/18 0700  BP:   100/69   Pulse: (!) 106 (!) 103 94   Resp:      Temp:  98.4 F (36.9 C)  98.8 F (37.1 C)  TempSrc:  Oral  Oral  SpO2: 98% 96% 99%   Weight:      Height:        Intake/Output Summary (Last 24 hours) at 09/29/2018 0930  Last data filed at 09/28/2018 1800 Gross per 24 hour  Intake 765 ml  Output 1400 ml  Net -635 ml   Filed Weights   09/16/18 1325 09/28/18 1500 09/28/18 1914  Weight: 66.7 kg 59 kg 57.6 kg    Examination:  General exam: Appears calm and comfortable.  Frail.  Cachectic. Respiratory system: Clear to auscultation. Respiratory effort normal. Cardiovascular system: S1 & S2 heard, RRR. No JVD, murmurs, rubs, gallops or clicks. No pedal edema. Gastrointestinal system: Abdomen is nondistended, soft and nontender.  No organomegaly or masses felt. Normal bowel sounds heard. Central nervous system: Alert and oriented. No focal neurological deficits. Extremities: Symmetric 5 x 5 power. Skin: No rashes, lesions or ulcers Psychiatry: Judgement and insight appear normal. Mood & affect appropriate.     Data Reviewed: I have personally reviewed following labs and imaging studies  CBC: Recent Labs  Lab 09/26/18 1815 09/27/18 0616 09/27/18 1556 09/28/18 0449 09/28/18 1954 09/29/18 0324  WBC 21.8* 16.3*  --  17.8* 14.3* 14.0*  HGB 8.0* 6.8* 8.6* 7.5* 9.0* 8.9*  HCT 24.9* 20.8* 25.8* 22.0* 27.4* 26.7*  MCV 88.0 85.6  --  84.6 87.3 85.0  PLT 331 309  --  261 223 462   Basic Metabolic Panel: Recent Labs  Lab 09/23/18 0433  09/25/18 2233 09/26/18 0413 09/27/18 0616 09/28/18 0449 09/29/18 0324  NA 133*   < > 133* 134* 133* 135 134*  K 4.5   < > 3.9 4.0 4.7 3.9 3.5  CL 100   < > 103 100 107 107 99  CO2 21*   < > 20* 25 18* 22 27  GLUCOSE 251*   < > 69* 50* 339* 186* 186*  BUN 33*   < > 14 14 75* 44* 25*  CREATININE 1.86*   < > 0.59* 0.67 1.24 0.56* 0.55*  CALCIUM 8.2*   < > 8.1* 8.2* 7.3* 7.4* 7.6*  MG 1.6*  --   --  1.7  --  1.6* 1.9  PHOS  --   --   --   --   --  2.5 2.5   < > = values in this interval not displayed.   GFR: Estimated Creatinine Clearance: 83 mL/min (A) (by C-G formula based on SCr of 0.55 mg/dL (L)). Liver Function Tests: Recent Labs  Lab 09/25/18 2233 09/26/18 0413  AST 15 15  ALT 19 20  ALKPHOS 63 68  BILITOT 0.4 0.5  PROT 5.9* 5.9*  ALBUMIN 1.8* 1.8*   No results for input(s): LIPASE, AMYLASE in the last 168 hours. No results for input(s): AMMONIA in the last 168 hours. Coagulation Profile: Recent Labs  Lab 09/27/18 0616  INR 1.50   Cardiac Enzymes: Recent Labs  Lab 09/25/18 2233  TROPONINI <0.03   BNP (last 3 results) No results for input(s): PROBNP in the last 8760 hours. HbA1C: No results for input(s): HGBA1C in the last 72  hours. CBG: Recent Labs  Lab 09/28/18 1624 09/28/18 1936 09/28/18 2334 09/29/18 0344 09/29/18 0736  GLUCAP 283* 111* 198* 179* 161*   Lipid Profile: No results for input(s): CHOL, HDL, LDLCALC, TRIG, CHOLHDL, LDLDIRECT in the last 72 hours. Thyroid Function Tests: No results for input(s): TSH, T4TOTAL, FREET4, T3FREE, THYROIDAB in the last 72 hours. Anemia Panel: No results for input(s): VITAMINB12, FOLATE, FERRITIN, TIBC, IRON, RETICCTPCT in the last 72 hours. Sepsis Labs: No results for input(s): PROCALCITON, LATICACIDVEN in the last 168 hours.  Recent Results (from the past 240 hour(s))  Culture, blood (Routine X 2) w Reflex to ID Panel     Status: None (Preliminary result)   Collection Time: 09/26/18  7:35 PM  Result Value Ref Range Status   Specimen Description BLOOD RIGHT ANTECUBITAL  Final   Special Requests IN PEDIATRIC BOTTLE Blood Culture adequate volume  Final   Culture   Final    NO GROWTH 3 DAYS Performed at [facility], [place] [address], [place], [place] [address]    Report Status PENDING  Incomplete         Radiology Studies: Dg Chest Port 1 View  Result Date: 09/28/2018 CLINICAL DATA:  Respiratory failure.  Shortness of breath. EXAM: PORTABLE CHEST 1 VIEW COMPARISON:  09/27/2018. FINDINGS: Mediastinum hilar structures are normal. Left mid lung and left base atelectatic changes and infiltrate noted. No pleural effusion or pneumothorax. Stable elevation left hemidiaphragm. Stable mild cardiomegaly. No pulmonary venous congestion. No acute bony abnormality. IMPRESSION: Left mid lung and left base atelectatic changes and infiltrate noted. Slight progression from prior exam. Electronically Signed   By: Marcello Moores  Register   On: 09/28/2018 05:33   Dg Abd Portable 1v  Result Date: 09/28/2018 CLINICAL DATA:  Abdominal distension. EXAM: PORTABLE ABDOMEN - 1 VIEW COMPARISON:  Radiograph September 07, 2018. CT scan of September 27, 2018. FINDINGS: The bowel gas pattern is  normal. No radio-opaque calculi or other significant radiographic abnormality are seen. IMPRESSION: No definite evidence of bowel obstruction or ileus. Electronically Signed   By: Marijo Conception, M.D.   On: 09/28/2018 13:11   Ct Pancreas Abd W/wo  Result Date: 09/27/2018 CLINICAL DATA:  Head of pancreas mass. Increased bleeding. Evaluate for duodenal obstruction EXAM: CT ABDOMEN WITHOUT AND WITH CONTRAST TECHNIQUE: Multidetector CT imaging of the abdomen was performed following the standard protocol before and following the bolus administration of intravenous contrast. CONTRAST:  129mL ISOVUE-370 IOPAMIDOL (ISOVUE-370) INJECTION 76% COMPARISON:  MRI 09/12/2018 FINDINGS: Lower chest: Subsegmental atelectasis and or consolidation noted in the left lung base. Hepatobiliary: 1 cm low-attenuation structure in the left lobe of liver is unchanged, image 26/5. No new focal liver abnormalities. Gallbladder appears normal. No intrahepatic bile duct dilatation. No common bile duct dilatation identified. Pancreas: Neck of pancreas mass is again noted. On today's exam this measures 4.5 x 3.7 by 4.7 cm, image 48/5. Previously 3.6 x 3.6 by 3.2 cm. There is atrophy of the body and tail of pancreas with dilatation of the main duct. Spleen: Normal in size without focal abnormality. Adrenals/Urinary Tract: Adrenal glands are unremarkable. Kidneys are normal, without renal calculi, focal lesion, or hydronephrosis. Stomach/Bowel: There is marked distension of the gastric lumen which is increased from 09/12/2018. The stomach is distended to the level of the distal antrum. No discrete mass identified. There is no abnormal gastric wall thickening or inflammation. No duodenal wall thickening or inflammation identified. There is mild increase caliber of the descending duodenum with a normal caliber transverse duodenum. Visualized small bowel loops are normal in caliber. No pathologic dilatation of the visualized colon.  Vascular/Lymphatic: Aortic atherosclerosis. Infrarenal abdominal aortic ectasia measures 3.2 cm in maximum distension. Embolization coils of the gastroduodenal artery noted. The portal vein, portal venous confluence and splenic vein remain patent. Soft tissue encases and partially narrows the celiac and superior mesenteric artery without complete occlusion. The celiac artery remains patent. Other: There is been interval decrease in subcutaneous and intra-abdominal fat. Musculoskeletal: Lumbar spondylosis. No aggressive lytic or sclerotic bone lesions. IMPRESSION: 1. Interval marked distension of the  gastric lumen to the level of the gastric antrum. Etiology indeterminate. No discrete mass identified. 2. Interval increase in size of neck of pancreas mass. Partial involvement of the superior mesenteric artery and celiac artery. 3. Unchanged low-attenuation structure within left lobe of liver. Electronically Signed   By: Kerby Moors M.D.   On: 09/27/2018 10:48        Scheduled Meds: . sodium chloride   Intravenous Once  . atorvastatin  20 mg Oral QHS  . dronabinol  5 mg Oral BID AC  . gabapentin  300 mg Oral TID  . insulin aspart  0-9 Units Subcutaneous Q4H  . lidocaine  15 mL Oral Once  . LORazepam  1 mg Intravenous Once  . mouth rinse  15 mL Mouth Rinse BID  . pantoprazole  40 mg Oral BID  . tamsulosin  0.4 mg Oral QHS   Continuous Infusions: . phenylephrine (NEO-SYNEPHRINE) Adult infusion Stopped (09/27/18 1534)     LOS: 26 days    Time spent: 40 minutes    Irine Seal, MD [facility]  If 7PM-7AM, please contact night-coverage [email or web address] 09/29/2018, 9:30 AM

## 2018-09-30 ENCOUNTER — Ambulatory Visit: Payer: Self-pay

## 2018-09-30 ENCOUNTER — Other Ambulatory Visit: Payer: Self-pay

## 2018-09-30 ENCOUNTER — Ambulatory Visit
Admit: 2018-09-30 | Discharge: 2018-09-30 | Disposition: A | Discharge status: Home / Self Care | Payer: Self-pay | Attending: Radiation Oncology | Admitting: Radiation Oncology

## 2018-09-30 DIAGNOSIS — C259 Malignant neoplasm of pancreas, unspecified: Secondary | ICD-10-CM

## 2018-09-30 LAB — MAGNESIUM: Magnesium: 1.5 mg/dL — ABNORMAL LOW (ref 1.7–2.4)

## 2018-09-30 LAB — BASIC METABOLIC PANEL
Anion gap: 10 (ref 5–15)
BUN: 14 mg/dL (ref 6–20)
CO2: 26 mmol/L (ref 22–32)
Calcium: 7.3 mg/dL — ABNORMAL LOW (ref 8.9–10.3)
Chloride: 99 mmol/L (ref 98–111)
Creatinine, Ser: 0.51 mg/dL — ABNORMAL LOW (ref 0.61–1.24)
GFR calc Af Amer: >60 mL/min (ref >60)
GFR calc non Af Amer: >60 mL/min (ref >60)
Glucose, Bld: 169 mg/dL — ABNORMAL HIGH (ref 70–99)
Potassium: 3.9 mmol/L (ref 3.5–5.1)
Sodium: 135 mmol/L (ref 135–145)

## 2018-09-30 LAB — CBC
HCT: 25.6 % — ABNORMAL LOW (ref 39.0–52.0)
Hemoglobin: 8.4 g/dL — ABNORMAL LOW (ref 13.0–17.0)
MCH: 28.9 pg (ref 26.0–34.0)
MCHC: 32.8 g/dL (ref 30.0–36.0)
MCV: 88 fL (ref 80.0–100.0)
NRBC: 0 % (ref 0.0–0.2)
Platelets: 193 10*3/uL (ref 150–400)
RBC: 2.91 MIL/uL — ABNORMAL LOW (ref 4.22–5.81)
RDW: 14.1 % (ref 11.5–15.5)
WBC: 14.1 10*3/uL — AB (ref 4.0–10.5)

## 2018-09-30 LAB — GLUCOSE, CAPILLARY
Glucose-Capillary: 237 mg/dL — ABNORMAL HIGH (ref 70–99)
Glucose-Capillary: 115 mg/dL — ABNORMAL HIGH (ref 70–99)
Glucose-Capillary: 295 mg/dL — ABNORMAL HIGH (ref 70–99)
Glucose-Capillary: 280 mg/dL — ABNORMAL HIGH (ref 70–99)

## 2018-09-30 LAB — HEMOGLOBIN AND HEMATOCRIT, BLOOD
HCT: 24.6 % — ABNORMAL LOW (ref 39.0–52.0)
Hemoglobin: 7.9 g/dL — ABNORMAL LOW (ref 13.0–17.0)

## 2018-09-30 MED ORDER — MAGNESIUM SULFATE 4 GM/100ML IV SOLN
4.0000 g | Freq: Once | INTRAVENOUS | Status: AC
  Administered 2018-09-30: 4 g via INTRAVENOUS
  Filled 2018-09-30: qty 100

## 2018-09-30 MED ORDER — SENNOSIDES-DOCUSATE SODIUM 8.6-50 MG PO TABS
1.0000 | ORAL_TABLET | Freq: Two times a day (BID) | ORAL | Status: DC
  Administered 2018-09-30 – 2018-10-13 (×13): 1 via ORAL
  Filled 2018-09-30 (×30): qty 1

## 2018-09-30 MED ORDER — CARVEDILOL 3.125 MG PO TABS
3.1250 mg | ORAL_TABLET | Freq: Two times a day (BID) | ORAL | Status: DC
  Administered 2018-09-30 – 2018-11-04 (×68): 3.125 mg via ORAL
  Filled 2018-09-30 (×69): qty 1

## 2018-09-30 MED ORDER — ENSURE ENLIVE PO LIQD
237.0000 mL | Freq: Two times a day (BID) | ORAL | Status: DC
  Administered 2018-09-30 – 2018-10-20 (×28): 237 mL via ORAL

## 2018-09-30 NOTE — Progress Notes (Addendum)
PROGRESS NOTE    Ryan Medina  XBM:[id number] DOB: 1961/03/18 DOA: 09/03/2018 PCP: Patient, No Pcp Per   Brief Narrative:  43 yoM admitted with LLE pain found to have ischemic right leg and acute popliteal and tibial embolus s/p embolectomy 12/13. On heparin gtt. Developed acute RLL hematoma with compartment syndrome requiring fasciotomy 12/15. Decompensated 12/17 am with hypotension with abd distention and 2 massive bloody stools. Hgb drop 9.5 ->5.9. Getting 2 units PRBC, protonix, and GI consulted. Moved to ICU for hemodynamic instability. Had embolization of GDA 09/07/2018. Transferred out of ICU 12/18.  Transferred back to ICU 1/5 overnight for hypotension and recurrent anemia.  Patient transfused a total of 22 units packed red blood cells.  Patient seen by oncology and patient transferred to [facility] for radiation therapy to pancreatic mass.   Assessment & Plan:   Principal Problem:   Popliteal artery embolus (HCC) Active Problems:   DM (diabetes mellitus), type 2, uncontrolled ([place])   Essential hypertension   Dilated cardiomyopathy (HCC)   Chest pain   AAA (abdominal aortic aneurysm) (HCC)   Ischemic foot   Right leg claudication (HCC)   PVD (peripheral vascular disease) ([place])   Hemorrhagic shock (HCC)   Nontraumatic ischemic infarction of muscle of right lower leg   Upper GI bleed   Cocaine abuse ([place])   Acute blood loss anemia   Chronic pain syndrome   Duodenal ulcer with hemorrhage   Pancreatic cancer (HCC)   Goals of care, counseling/discussion   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  Noted to be  nonresectable.  Patient seen in consultation by oncology and patient transferred to [facility] long to start radiation treatment.  Chemotherapy on hold for now per oncology. Per Oncology patient will likely need to stay in-house for entirety of his radiation treatment.  Patient to start radiation treatment today.  Palliative care consulted and also following.  Per oncology.  2.  Acute hemorrhagic shock secondary to acute GI bleed Patient noted to be in acute hemorrhagic shock requiring pressors and octreotide which has since been discontinued.  EGD from 09/09/2018 showed bleeding duodenal ulcer likely etiology of patient's bleed with concerns for malignancy from invasive pancreatic cancer despite negative biopsy results.  Patient status post recurrent bleeding despite previous EGD/hemo-spray 09/07/2018.  Patient status post GDA embolization 09/07/2018.  Endoscopic ulcer appearance on 09/16/2018 showed deeply cratered, friable, intermittent oozing blood, firm.  IR holding on repeat angiogram/embolization..  Systolic blood pressures in the low 100s.  Patient currently asymptomatic.  Patient denies any further GI bleed.  Patient status post 22 units packed red blood cells.  Patient status post GDA embolization per IR 09/07/2018.  Hemoglobin currently at 8.4 this morning. Patient s/p IV iron per hematology/oncology.  Octreotide has been discontinued.  Continue PPI twice daily.  Advance diet to a soft diet.  Monitor H&H.  3.  Acute blood loss anemia Secondary to problem #2.  Status post 22 units packed red blood cells.  Hemoglobin currently at 8.4.  Follow H&H.  4.  Extensive left popliteal artery embolus  status post embolectomy and fasciotomies of right lower extremity.  Staples have been removed.  Patient deemed not a candidate  for anticoagulation due to GI bleed.  Per vascular surgery.  5.??  Small bowel obstruction Gastric distention noted on CT abdomen with some distention.  Patient with no nausea or  vomiting.  Patient states having brown stool.  No need for NG tube at this time.  Diet has been advanced to a soft diet.  Follow.    6.  Chronic systolic heart failure/diastolic cardiomyopathy Complicated by cocaine use.  Currently stable.  Supportive care.  7.  Cocaine abuse Patient counseled on cessation.  8.  Diabetes mellitus II Hemoglobin A1c 18.2 on 08/23/2018.  Hemoglobin A1c was 18.2 on 08/23/2018.  CBG of 115 today.  Diet has been advanced to a soft diet.  Continue sliding scale insulin.  Once oral intake improves will likely require low-dose basal insulin.  Follow.   9.  Tachycardia Patient denies any chest pain.  Check a EKG.  Check a TSH.  Placed on Coreg 3.125 mg p.o. twice daily.   DVT prophylaxis: Recent GI bleed not a anticoagulation candidate at this time. Code Status: Partial Family Communication: Updated patient and fianc at bedside. Disposition Plan: Remain in stepdown unit for now and likely transfer to telemetry versus MedSurg if continued improvement tomorrow.  Significant Hospital Events   12/13 Admitted, right popliteal and tibial embolectomy with patch angioplasty 12/15 OR for evacuation of hematoma and lymphocele right leg, 4 compartment fasciotomy, placement of vac 12/17 GI and IR consult. Taken to IR for GDA embolization 12/19 EGD with hemospray applied to duodenal ulcer 12/20 OR for closure of medial and lateral fasciotomy incisions 12/21 CT A/P with pancreatic mass 12/23 CIR consult 12/26 EUS that showed pancreatic adenocarcinoma 12/30 oncology consult 1/2 radiation therapy feels XRT would not be of much benefit 1/4 IR consulted for port a cath placement 1/5 transferred back to ICU for hypotension and recurrent anemia. Since admit, had 14u PRBC's with additional 4 ordered 1/6 CT pancreas ordered by oncology     Consultants:  Vascular GI PCCM Oncology / radiation onc IR Palliative  Procedures:  TTE 12/14 >EF 45-50%, G1DD. CTA PE 12/16 >No  evidence of thoracic aortic aneurysm or dissection.Small bilateral effusions with associated bibasilarconsolidation. CTA A/P 12/21 >new pancreatic mass. Occluded GDA after coil embolization. Stable aneurysmal disease though has significant increase in mural thrombus within the dilated common iliac arteries bilaterally. MRI abd 12/22 > pancreatic neck mass c/w adenocarcinoma EUS 12/26 >pancreatic adenocarcinoma. Duodenal ulcer not c/w malignancy. TEE 12/26 > EF 50-55%, mild MR, no thrombus, negative bubble study. Renal US 1/2 > negative for hydro or acute process. CT pancreas 1/6 >Distention of gastric lumen to antrum, increase in size of pancreatic neck mass with involvement of superior mesenteric and celiac artery  12/13 LLE embolectomy  12/15 RLE fasciotomy  09/07/2018 intubation>> 09/07/2018 interventional radiology perform mesenteric angiography and cold embolization of the GDA    Antimicrobials:   None   Subjective: Patient sitting up in bed eating his breakfast.  Patient happy to have solid food today.  Denies any chest pain no shortness of breath.  Denies any nausea or vomiting.  Denies any abdominal pain.  Denies any further bleeding.  Tolerating current diet.   Objective: Vitals:   09/30/18 0300 09/30/18 0306 09/30/18 0400 09/30/18 0800  BP: (!) 95/53  103/60   Pulse:      Resp:      Temp:  99.6 F (37.6 C)  98.9 F (37.2 C)  TempSrc:  Oral  Oral  SpO2:  Weight:      Height:        Intake/Output Summary (Last 24 hours) at 09/30/2018 0933 Last data filed at 09/30/2018 0200 Gross per 24 hour  Intake 840 ml  Output 1000 ml  Net -160 ml   Filed Weights   09/16/18 1325 09/28/18 1500 09/28/18 1914  Weight: 66.7 kg 59 kg 57.6 kg    Examination:  General exam: Appears calm and comfortable.  Frail.  Cachectic. Respiratory system: Lungs clear to auscultation bilaterally.  No wheezes, no crackles, rhonchi. Cardiovascular system: Tachycardic.  No JVD, no  murmurs, no rubs, no gallops.  No lower extremity edema. Gastrointestinal system: Abdomen is soft, nontender, nondistended, positive bowel sounds.  No rebound.  No guarding. Central nervous system: Alert and oriented. No focal neurological deficits. Extremities: Symmetric 5 x 5 power. Skin: No rashes, lesions or ulcers Psychiatry: Judgement and insight appear normal. Mood & affect appropriate.     Data Reviewed: I have personally reviewed following labs and imaging studies  CBC: Recent Labs  Lab 09/28/18 1954 09/29/18 0324 09/29/18 0717 09/29/18 1433 09/30/18 0317  WBC 14.3* 14.0* 13.7* 12.7* 14.1*  HGB 9.0* 8.9* 8.6* 7.8* 8.4*  HCT 27.4* 26.7* 26.3* 23.7* 25.6*  MCV 87.3 85.0 88.9 86.2 88.0  PLT 223 241 232 229 096   Basic Metabolic Panel: Recent Labs  Lab 09/26/18 0413 09/27/18 0616 09/28/18 0449 09/29/18 0324 09/30/18 0317  NA 134* 133* 135 134* 135  K 4.0 4.7 3.9 3.5 3.9  CL 100 107 107 99 99  CO2 25 18* 22 27 26   GLUCOSE 50* 339* 186* 186* 169*  BUN 14 75* 44* 25* 14  CREATININE 0.67 1.24 0.56* 0.55* 0.51*  CALCIUM 8.2* 7.3* 7.4* 7.6* 7.3*  MG 1.7  --  1.6* 1.9 1.5*  PHOS  --   --  2.5 2.5  --    GFR: Estimated Creatinine Clearance: 83 mL/min (A) (by C-G formula based on SCr of 0.51 mg/dL (L)). Liver Function Tests: Recent Labs  Lab 09/25/18 2233 09/26/18 0413  AST 15 15  ALT 19 20  ALKPHOS 63 68  BILITOT 0.4 0.5  PROT 5.9* 5.9*  ALBUMIN 1.8* 1.8*   No results for input(s): LIPASE, AMYLASE in the last 168 hours. No results for input(s): AMMONIA in the last 168 hours. Coagulation Profile: Recent Labs  Lab 09/27/18 0616  INR 1.50   Cardiac Enzymes: Recent Labs  Lab 09/25/18 2233  TROPONINI <0.03   BNP (last 3 results) No results for input(s): PROBNP in the last 8760 hours. HbA1C: No results for input(s): HGBA1C in the last 72 hours. CBG: Recent Labs  Lab 09/29/18 0736 09/29/18 1154 09/29/18 1606 09/29/18 1917 09/29/18 2146  GLUCAP  161* 334* 64* 210* 263*   Lipid Profile: No results for input(s): CHOL, HDL, LDLCALC, TRIG, CHOLHDL, LDLDIRECT in the last 72 hours. Thyroid Function Tests: No results for input(s): TSH, T4TOTAL, FREET4, T3FREE, THYROIDAB in the last 72 hours. Anemia Panel: No results for input(s): VITAMINB12, FOLATE, FERRITIN, TIBC, IRON, RETICCTPCT in the last 72 hours. Sepsis Labs: No results for input(s): PROCALCITON, LATICACIDVEN in the last 168 hours.  Recent Results (from the past 240 hour(s))  Culture, blood (Routine X 2) w Reflex to ID Panel     Status: None (Preliminary result)   Collection Time: 09/26/18  7:35 PM  Result Value Ref Range Status   Specimen Description BLOOD RIGHT ANTECUBITAL  Final   Special Requests IN PEDIATRIC BOTTLE Blood Culture adequate volume  Final   Culture   Final    NO GROWTH 4 DAYS Performed at [facility], [place] [address] [place], [place] [address]    Report Status PENDING  Incomplete         Radiology Studies: Dg Abd Portable 1v  Result Date: 09/28/2018 CLINICAL DATA:  Abdominal distension. EXAM: PORTABLE ABDOMEN - 1 VIEW COMPARISON:  Radiograph September 07, 2018. CT scan of September 27, 2018. FINDINGS: The bowel gas pattern is normal. No radio-opaque calculi or other significant radiographic abnormality are seen. IMPRESSION: No definite evidence of bowel obstruction or ileus. Electronically Signed   By: Marijo Conception, M.D.   On: 09/28/2018 13:11        Scheduled Meds: . sodium chloride   Intravenous Once  . atorvastatin  20 mg Oral QHS  . dronabinol  5 mg Oral BID AC  . feeding supplement (ENSURE ENLIVE)  237 mL Oral BID BM  . gabapentin  300 mg Oral TID  . insulin aspart  0-9 Units Subcutaneous TID AC & HS  . lidocaine  15 mL Oral Once  . LORazepam  1 mg Intravenous Once  . mouth rinse  15 mL Mouth Rinse BID  . pantoprazole  40 mg Oral BID  . senna-docusate  1 tablet Oral BID  . tamsulosin  0.4 mg Oral QHS   Continuous Infusions: .  magnesium sulfate 1 - 4 g bolus IVPB       LOS: 27 days    Time spent: 40 minutes    Irine Seal, MD [facility]  If 7PM-7AM, please contact night-coverage [email or web address] 09/30/2018, 9:33 AM

## 2018-09-30 NOTE — Progress Notes (Signed)
[facility] Therapy Treatment Record Phone [phone]   Radiation Therapy was administered to [place] on: 09/30/2018  8:59 AM and was treatment # 1 out of a planned course of 10 treatments.  Radiation Treatment  1). Beam photons with 6-10 energy  2). Brachytherapy None  3). Stereotactic Radiosurgery None  4). Other Radiation None     Byrd Hesselbach, RT (T)

## 2018-09-30 NOTE — Addendum Note (Signed)
Encounter addended by: Byrd Hesselbach, RT (T) on: 09/30/2018 9:00 AM  Actions taken: Clinical Note Signed

## 2018-09-30 NOTE — Progress Notes (Signed)
IR aware of request for possible image-guided Port-a-cath insertion.  Dr. Marin Olp note from 09/29/2018: "In talking with Dr. Sondra Come of radiation oncology, he will use fairly high dose fractions.  As such, we will not use concurrent chemotherapy."  Will cancel order for [facility] placement as there are no plans to begin chemotherapy.  IR available in future if needed.  Bea Graff Briggitte Boline, PA-C 09/30/2018, 12:15 PM

## 2018-09-30 NOTE — Progress Notes (Signed)
  Radiation Oncology         [phone] ________________________________  Name: Ryan Medina MRN: [id number]  Date: 09/30/2018  DOB: [birth year]  Simulation Verification Note    ICD-10-CM   1. Pancreatic adenocarcinoma (HCC) C25.9     Status: inpatient  NARRATIVE: The patient was brought to the treatment unit and placed in the planned treatment position. The clinical setup was verified. Then port films were obtained and uploaded to the radiation oncology medical record software.  The treatment beams were carefully compared against the planned radiation fields. The position location and shape of the radiation fields was reviewed. They targeted volume of tissue appears to be appropriately covered by the radiation beams. Organs at risk appear to be excluded as planned.  Based on my personal review, I approved the simulation verification. The patient's treatment will proceed as planned.  -----------------------------------  Blair Promise, PhD, MD  This document serves as a record of services personally performed by Gery Pray, MD. It was created on his behalf by Rae Lips, a trained medical scribe. The creation of this record is based on the scribe's personal observations and the provider's statements to them. This document has been checked and approved by the attending provider.

## 2018-10-01 ENCOUNTER — Ambulatory Visit
Admit: 2018-10-01 | Discharge: 2018-10-01 | Disposition: A | Discharge status: Home / Self Care | Payer: Self-pay | Attending: Radiation Oncology | Admitting: Radiation Oncology

## 2018-10-01 ENCOUNTER — Inpatient Hospital Stay (HOSPITAL_COMMUNITY): Payer: Medicaid Other

## 2018-10-01 LAB — BASIC METABOLIC PANEL
Anion gap: 9 (ref 5–15)
BUN: 13 mg/dL (ref 6–20)
CO2: 26 mmol/L (ref 22–32)
Calcium: 7.1 mg/dL — ABNORMAL LOW (ref 8.9–10.3)
Chloride: 98 mmol/L (ref 98–111)
Creatinine, Ser: 0.53 mg/dL — ABNORMAL LOW (ref 0.61–1.24)
GFR calc non Af Amer: >60 mL/min (ref >60)
Glucose, Bld: 214 mg/dL — ABNORMAL HIGH (ref 70–99)
Potassium: 3.8 mmol/L (ref 3.5–5.1)
Sodium: 133 mmol/L — ABNORMAL LOW (ref 135–145)

## 2018-10-01 LAB — URINALYSIS, ROUTINE W REFLEX MICROSCOPIC
Bilirubin Urine: NEGATIVE
Glucose, UA: >=500 mg/dL — AB
Hgb urine dipstick: NEGATIVE
Ketones, ur: 5 mg/dL — AB
Leukocytes, UA: NEGATIVE
Nitrite: NEGATIVE
Protein, ur: NEGATIVE mg/dL
Specific Gravity, Urine: 1.028 (ref 1.005–1.030)
pH: 5 (ref 5.0–8.0)

## 2018-10-01 LAB — GLUCOSE, CAPILLARY
Glucose-Capillary: 245 mg/dL — ABNORMAL HIGH (ref 70–99)
Glucose-Capillary: 196 mg/dL — ABNORMAL HIGH (ref 70–99)
Glucose-Capillary: 162 mg/dL — ABNORMAL HIGH (ref 70–99)
Glucose-Capillary: 76 mg/dL (ref 70–99)

## 2018-10-01 LAB — CULTURE, BLOOD (ROUTINE X 2)
Culture: NO GROWTH
Special Requests: ADEQUATE

## 2018-10-01 LAB — CBC
HCT: 23.2 % — ABNORMAL LOW (ref 39.0–52.0)
Hemoglobin: 7.4 g/dL — ABNORMAL LOW (ref 13.0–17.0)
MCH: 28.7 pg (ref 26.0–34.0)
MCHC: 31.9 g/dL (ref 30.0–36.0)
MCV: 89.9 fL (ref 80.0–100.0)
Platelets: 230 10*3/uL (ref 150–400)
RBC: 2.58 MIL/uL — ABNORMAL LOW (ref 4.22–5.81)
RDW: 13.9 % (ref 11.5–15.5)
WBC: 18.7 10*3/uL — AB (ref 4.0–10.5)
nRBC: 0 % (ref 0.0–0.2)

## 2018-10-01 LAB — HEMOGLOBIN AND HEMATOCRIT, BLOOD
HCT: 23.2 % — ABNORMAL LOW (ref 39.0–52.0)
HEMOGLOBIN: 7.5 g/dL — AB (ref 13.0–17.0)

## 2018-10-01 LAB — MAGNESIUM: Magnesium: 2 mg/dL (ref 1.7–2.4)

## 2018-10-01 LAB — TSH: TSH: 2.551 u[IU]/mL (ref 0.350–4.500)

## 2018-10-01 MED ORDER — ACETAMINOPHEN 325 MG PO TABS
650.0000 mg | ORAL_TABLET | Freq: Four times a day (QID) | ORAL | Status: DC | PRN
  Administered 2018-10-02 – 2018-11-02 (×12): 650 mg via ORAL
  Filled 2018-10-01 (×12): qty 2

## 2018-10-01 MED ORDER — INSULIN GLARGINE 100 UNIT/ML ~~LOC~~ SOLN
10.0000 [IU] | Freq: Every morning | SUBCUTANEOUS | Status: DC
  Administered 2018-10-01 – 2018-10-03 (×2): 10 [IU] via SUBCUTANEOUS
  Filled 2018-10-01 (×3): qty 0.1

## 2018-10-01 NOTE — Progress Notes (Signed)
NP on call made aware that patient's temperature tonight is 102.4 and no prn antipyretic ordered. Will continue to monitor patient and carry out any new orders. Carnella [place] I

## 2018-10-01 NOTE — Progress Notes (Signed)
LCSW consulted for SNF placement.   Per CSW note on 1/3 patient will likely remain in hospital.   Patient has no local bed offers and cannot go out of town due to oncology treatments and transportation.   Patient has no insurance and pending medicaid app.   LCSW signing off. Please submit new consult if CSW needs arise.    Carolin Coy [place]

## 2018-10-01 NOTE — Progress Notes (Signed)
Patient has refused ECG leads and refuses to be monitored at this time. Patient has been educated. Bed alarm on and call bell in place. RN will continue to monitor closely.

## 2018-10-01 NOTE — Progress Notes (Signed)
Pt is refusing to take Tylenol at this time for temperature of 102.4. Will continue to monitor. Ryan Medina

## 2018-10-01 NOTE — Progress Notes (Signed)
PT Cancellation Note  Patient Details Name: Shondale Quinley Gauthier MRN: [id number] DOB: [birth year]   Cancelled Treatment:     per chart review pt's HgB is trending down poss due to bleeding ulcer/tumor per MD PN.   Will hold off on Physical Therapy for today.  Pt is physically able to transfer in/OOB with + 1 assist to recliner/BSC but for gait he is unable to tolerate WBing through R LE and is very unsteady.     Rica Koyanagi  PTA Acute  Rehabilitation Services Pager      [phone] Office      [phone]

## 2018-10-01 NOTE — Progress Notes (Signed)
PROGRESS NOTE    Ryan Medina  YJE:[id number] DOB: [birth year] DOA: 09/03/2018 PCP: Patient, No Pcp Per   Brief Narrative:  83 yoM admitted with LLE pain found to have ischemic right leg and acute popliteal and tibial embolus s/p embolectomy 12/13. On heparin gtt. Developed acute RLL hematoma with compartment syndrome requiring fasciotomy 12/15. Decompensated 12/17 am with hypotension with abd distention and 2 massive bloody stools. Hgb drop 9.5 ->5.9. Getting 2 units PRBC, protonix, and GI consulted. Moved to ICU for hemodynamic instability. Had embolization of GDA 09/07/2018. Transferred out of ICU 12/18.  Transferred back to ICU 1/5 overnight for hypotension and recurrent anemia.  Patient transfused a total of 22 units packed red blood cells.  Patient seen by oncology and patient transferred to [facility] for radiation therapy to pancreatic mass.   Assessment & Plan:   Principal Problem:   Popliteal artery embolus (HCC) Active Problems:   DM (diabetes mellitus), type 2, uncontrolled ([place])   Essential hypertension   Dilated cardiomyopathy (HCC)   Chest pain   AAA (abdominal aortic aneurysm) (HCC)   Ischemic foot   Right leg claudication (HCC)   PVD (peripheral vascular disease) ([place])   Hemorrhagic shock (HCC)   Nontraumatic ischemic infarction of muscle of right lower leg   Upper GI bleed   Cocaine abuse ([place])   Acute blood loss anemia   Chronic pain syndrome   Duodenal ulcer with hemorrhage   Pancreatic cancer (HCC)   Goals of care, counseling/discussion   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  Noted to be  nonresectable.  Patient seen in consultation by oncology and patient transferred to [facility] to start radiation treatment.  Chemotherapy on hold for now per oncology. Per Oncology patient will likely need to stay in-house for entirety of his radiation treatment.  Patient already started with radiation treatments. Palliative care consulted and also following.  Per oncology.  2.  Acute hemorrhagic shock secondary to acute GI bleed Patient noted to be in acute hemorrhagic shock requiring pressors and octreotide which has since been discontinued.  EGD from 09/09/2018 showed bleeding duodenal ulcer likely etiology of patient's bleed with concerns for malignancy from invasive pancreatic cancer despite negative biopsy results.  Patient status post recurrent bleeding despite previous EGD/hemo-spray 09/07/2018.  Patient status post GDA embolization 09/07/2018.  Endoscopic ulcer appearance on 09/16/2018 showed deeply cratered, friable, intermittent oozing blood, firm.  IR holding on repeat angiogram/embolization..  Systolic blood pressures in the low 100s.  Patient currently asymptomatic.  Patient with black/melanotic stools ongoing.  Hemoglobin slowly trickling down and currently at 7.4.  Patient likely slowly losing some blood.  Patient status post 22 units packed red blood cells.  Patient status post GDA embolization per IR 09/07/2018.  Hemoglobin currently at 7.4 this morning. Patient s/p IV iron per hematology/oncology.  Octreotide has been discontinued.  Continue PPI twice daily.  Repeat H&H this afternoon.  Patient currently tolerating soft diet.  Will have GI reassess.  Transfusion threshold hemoglobin less than 7.  3.  Acute blood loss anemia Secondary to problem #2.  Status post 22 units packed red blood cells.  Hemoglobin currently at 7.4.  Hemoglobin trickling down.  Patient likely slowly losing blood from duodenal ulcer.  Repeat H&H this afternoon.  Transfusion threshold hemoglobin less than 7.  Follow  H&H.  4.  Extensive left popliteal artery embolus  status post embolectomy and fasciotomies of right lower extremity.  Staples have been removed.  Patient deemed not a candidate for anticoagulation due to GI bleed.  Pain management.  Per vascular surgery.  5.??  Small bowel obstruction Gastric distention noted on CT abdomen with some distention.  Patient with no nausea or vomiting.  Patient states having brown stool.  No need for NG tube at this time.  Diet has been advanced to a soft diet which patient is tolerating.  Follow.    6.  Chronic systolic heart failure/diastolic cardiomyopathy Complicated by cocaine use.  Currently stable.  Supportive care.  7.  Cocaine abuse Patient counseled on cessation.  8.  Diabetes mellitus II Hemoglobin A1c 18.2 on 08/23/2018.  Hemoglobin A1c was 18.2 on 08/23/2018.  CBG of 245 today.  Diet has been advanced to a soft diet.  Continue sliding scale insulin.  Place on Lantus 10 units daily.  Follow.   9.  Tachycardia Patient denies any chest pain.  EKG with a sinus tachycardia.  TSH within normal limits at 2.551.  Heart rate improved on current regimen of Coreg.  Follow.    DVT prophylaxis: Recent GI bleed not a anticoagulation candidate at this time. Code Status: Partial Family Communication: Updated patient and fianc at bedside. Disposition Plan: likely transfer to telemetry versus MedSurg.  Patient will need to stay in-house for completion of radiation treatment and when okay with oncology.   Significant Hospital Events   12/13 Admitted, right popliteal and tibial embolectomy with patch angioplasty 12/15 OR for evacuation of hematoma and lymphocele right leg, 4 compartment fasciotomy, placement of vac 12/17 GI and IR consult. Taken to IR for GDA embolization 12/19 EGD with hemospray applied to duodenal ulcer 12/20 OR for closure of medial and lateral fasciotomy incisions 12/21 CT A/P with pancreatic mass 12/23 CIR consult 12/26 EUS that showed  pancreatic adenocarcinoma 12/30 oncology consult 1/2 radiation therapy feels XRT would not be of much benefit 1/4 IR consulted for port a cath placement 1/5 transferred back to ICU for hypotension and recurrent anemia. Since admit, had 14u PRBC's with additional 4 ordered 1/6 CT pancreas ordered by oncology     Consultants:  Vascular GI PCCM Oncology / radiation onc IR Palliative  Procedures:  TTE 12/14 >EF 45-50%, G1DD. CTA PE 12/16 >No evidence of thoracic aortic aneurysm or dissection.Small bilateral effusions with associated bibasilarconsolidation. CTA A/P 12/21 >new pancreatic mass. Occluded GDA after coil embolization. Stable aneurysmal disease though has significant increase in mural thrombus within the dilated common iliac arteries bilaterally. MRI abd 12/22 > pancreatic neck mass c/w adenocarcinoma EUS 12/26 >pancreatic adenocarcinoma. Duodenal ulcer not c/w malignancy. TEE 12/26 > EF 50-55%, mild MR, no thrombus, negative bubble study. Renal US 1/2 > negative for hydro or acute process. CT pancreas 1/6 >Distention of gastric lumen to antrum, increase in size of pancreatic neck mass with involvement of superior mesenteric and celiac artery  12/13 LLE embolectomy  12/15 RLE fasciotomy  09/07/2018 intubation>> 09/07/2018 interventional radiology perform mesenteric angiography and cold embolization of the GDA    Antimicrobials:   None   Subjective: Patient sleeping however easily arousable.  Per RN patient refusing telemetry however patient states he is willing to place telemetry back on and telemetry was not sticking as well.  Patient states that  he is having black stools.  Denies any chest pain.  No shortness of breath.  No abdominal pain.  Tolerating current soft diet.  Complaining of some pain in his right lower extremity.   Objective: Vitals:   10/01/18 0000 10/01/18 0100 10/01/18 0438 10/01/18 0800  BP: (!) 137/93 126/78 118/72 138/74  Pulse: (!)  116     Resp: (!) 24 15  16   Temp: 99.1 F (37.3 C)  98.7 F (37.1 C)   TempSrc: Axillary  Axillary   SpO2: 91%   94%  Weight:      Height:        Intake/Output Summary (Last 24 hours) at 10/01/2018 0928 Last data filed at 10/01/2018 0900 Gross per 24 hour  Intake 2499.97 ml  Output 252 ml  Net 2247.97 ml   Filed Weights   09/16/18 1325 09/28/18 1500 09/28/18 1914  Weight: 66.7 kg 59 kg 57.6 kg    Examination:  General exam: NAD.  Frail.  Cachectic. Respiratory system: CTAB.  No wheezes, no crackles, no rhonchi.  Normal respiratory effort.  Cardiovascular system: Regular rate and rhythm no murmurs rubs or gallops.  No JVD.  No lower extremity edema.  Gastrointestinal system: Abdomen is nontender, nondistended, soft, positive bowel sounds.  No rebound.  No guarding.  Central nervous system: Alert and oriented. No focal neurological deficits. Extremities: Symmetric 5 x 5 power. Skin: No rashes, lesions or ulcers Psychiatry: Judgement and insight appear normal. Mood & affect appropriate.     Data Reviewed: I have personally reviewed following labs and imaging studies  CBC: Recent Labs  Lab 09/29/18 0324 09/29/18 0717 09/29/18 1433 09/30/18 0317 09/30/18 1627 10/01/18 0319  WBC 14.0* 13.7* 12.7* 14.1*  --  18.7*  HGB 8.9* 8.6* 7.8* 8.4* 7.9* 7.4*  HCT 26.7* 26.3* 23.7* 25.6* 24.6* 23.2*  MCV 85.0 88.9 86.2 88.0  --  89.9  PLT 241 232 229 193  --  650   Basic Metabolic Panel: Recent Labs  Lab 09/26/18 0413 09/27/18 0616 09/28/18 0449 09/29/18 0324 09/30/18 0317 10/01/18 0319  NA 134* 133* 135 134* 135 133*  K 4.0 4.7 3.9 3.5 3.9 3.8  CL 100 107 107 99 99 98  CO2 25 18* 22 27 26 26   GLUCOSE 50* 339* 186* 186* 169* 214*  BUN 14 75* 44* 25* 14 13  CREATININE 0.67 1.24 0.56* 0.55* 0.51* 0.53*  CALCIUM 8.2* 7.3* 7.4* 7.6* 7.3* 7.1*  MG 1.7  --  1.6* 1.9 1.5* 2.0  PHOS  --   --  2.5 2.5  --   --    GFR: Estimated Creatinine Clearance: 83 mL/min (A) (by C-G  formula based on SCr of 0.53 mg/dL (L)). Liver Function Tests: Recent Labs  Lab 09/25/18 2233 09/26/18 0413  AST 15 15  ALT 19 20  ALKPHOS 63 68  BILITOT 0.4 0.5  PROT 5.9* 5.9*  ALBUMIN 1.8* 1.8*   No results for input(s): LIPASE, AMYLASE in the last 168 hours. No results for input(s): AMMONIA in the last 168 hours. Coagulation Profile: Recent Labs  Lab 09/27/18 0616  INR 1.50   Cardiac Enzymes: Recent Labs  Lab 09/25/18 2233  TROPONINI <0.03   BNP (last 3 results) No results for input(s): PROBNP in the last 8760 hours. HbA1C: No results for input(s): HGBA1C in the last 72 hours. CBG: Recent Labs  Lab 09/30/18 0733 09/30/18 1204 09/30/18 1730 09/30/18 1951 10/01/18 0829  GLUCAP 237* 115* 295* 280* 245*   Lipid Profile:  No results for input(s): CHOL, HDL, LDLCALC, TRIG, CHOLHDL, LDLDIRECT in the last 72 hours. Thyroid Function Tests: Recent Labs    10/01/18 0319  TSH 2.551   Anemia Panel: No results for input(s): VITAMINB12, FOLATE, FERRITIN, TIBC, IRON, RETICCTPCT in the last 72 hours. Sepsis Labs: No results for input(s): PROCALCITON, LATICACIDVEN in the last 168 hours.  Recent Results (from the past 240 hour(s))  Culture, blood (Routine X 2) w Reflex to ID Panel     Status: None   Collection Time: 09/26/18  7:35 PM  Result Value Ref Range Status   Specimen Description BLOOD RIGHT ANTECUBITAL  Final   Special Requests IN PEDIATRIC BOTTLE Blood Culture adequate volume  Final   Culture   Final    NO GROWTH 5 DAYS Performed at [facility], [address] [place], [place] [address]    Report Status 10/01/2018 FINAL  Final         Radiology Studies: No results found.      Scheduled Meds: . sodium chloride   Intravenous Once  . atorvastatin  20 mg Oral QHS  . carvedilol  3.125 mg Oral BID WC  . dronabinol  5 mg Oral BID AC  . feeding supplement (ENSURE ENLIVE)  237 mL Oral BID BM  . gabapentin  300 mg Oral TID  . insulin aspart   0-9 Units Subcutaneous TID AC & HS  . insulin glargine  10 Units Subcutaneous BH-q7a  . lidocaine  15 mL Oral Once  . LORazepam  1 mg Intravenous Once  . mouth rinse  15 mL Mouth Rinse BID  . pantoprazole  40 mg Oral BID  . senna-docusate  1 tablet Oral BID  . tamsulosin  0.4 mg Oral QHS   Continuous Infusions:    LOS: 28 days    Time spent: 40 minutes    Irine Seal, MD [facility]  If 7PM-7AM, please contact night-coverage [email or web address] 10/01/2018, 9:28 AM

## 2018-10-01 NOTE — Progress Notes (Signed)
   Discussed patient with Dr. Grandville Silos.  Hgb drifting down and stools remain dark.  Not a surprise at all. Expect this to continue.  I am available if things worsen but would continue current care - transfuse prn Hgb < 7 as per Dr. Marin Olp.  Reserve EGD for attempt at therapy if clinical course indicates.  Gatha Mayer, MD, [facility] 10/01/2018 11:06 AM Pager [phone]

## 2018-10-01 NOTE — Progress Notes (Signed)
Pt and family member at bedside are adamantly refusing bed alarm at this time. Patient and family member verbalize understanding of the risks of not having the bed alarm on and staff not being in the room while the patient is ambulating but state that he does not have trouble ambulating normally and that he is not at risk for falls. Pt states, "even if I do fall I would not get hurt." This RN tried to explain in multiple ways how patient is at increased risk of falls while in hospital but patient and family member continue to disagree and demand that the bed alarm be turned off. Will continue to monitor pt closely. Carnella [place] I

## 2018-10-01 NOTE — Progress Notes (Signed)
Mr. Dresser started his radiation yesterday.  He had no problems with radiation.  He says he is eating a regular diet now.  He says his stools are still black.  His hemoglobin is down to 7.4.  I probably would hold off on transfusing him until the hemoglobin gets below 7.  He has had no nausea or vomiting.  His electrolytes show a sodium 133, potassium 3.8, glucose 214.  His BUN is 13 and creatinine 0.53.  His calcium is quite low at 7.1.  His white cell count is 18.7.  Platelet count 230K.  He probably will be moved out of the ICU today.  He is not on any monitor.  He has no IV fluids going.  I know that he is still at risk for bleeding from this ulcer/tumor.  I would think though that the radiation therapy will be able to knock this down.  He seems to have fairly good pain control.  The staples were taken out of his leg a couple days ago.  I still feel that he is going to need some kind of anticoagulation.  I do still think we can do it right now given the fact that he has this ulcer/tumor that is bleeding.  His vital signs are all stable.  His blood pressure is 118/72.  Pulse is 116.  Temperature is 98.7.  His abdomen is still slightly distended.  Bowel sounds are present.  There is no obvious fluid wave.  He has no guarding or rebound tenderness.  His lungs sound clear bilaterally.  Cardiac exam tachycardic but regular.  I know that his hemoglobin still have to be watched closely.  I am sure he still has some oozing from the GI tract.  I am glad that he is having regular food.  He really needs to have a nutritional state improved.  The way this will happen is by eating and taking in calories.  I very much appreciate all the great care that he is getting from the staff in the ICU.   [name], MD  Psalm 56:4

## 2018-10-02 ENCOUNTER — Inpatient Hospital Stay: Payer: Self-pay

## 2018-10-02 DIAGNOSIS — A419 Sepsis, unspecified organism: Secondary | ICD-10-CM | POA: Diagnosis not present

## 2018-10-02 DIAGNOSIS — N390 Urinary tract infection, site not specified: Secondary | ICD-10-CM

## 2018-10-02 DIAGNOSIS — J189 Pneumonia, unspecified organism: Secondary | ICD-10-CM | POA: Diagnosis not present

## 2018-10-02 LAB — STREP PNEUMONIAE URINARY ANTIGEN: Strep Pneumo Urinary Antigen: NEGATIVE

## 2018-10-02 LAB — GLUCOSE, CAPILLARY
Glucose-Capillary: 193 mg/dL — ABNORMAL HIGH (ref 70–99)
Glucose-Capillary: 249 mg/dL — ABNORMAL HIGH (ref 70–99)
Glucose-Capillary: 204 mg/dL — ABNORMAL HIGH (ref 70–99)

## 2018-10-02 LAB — PROTIME-INR
INR: 1.23
Prothrombin Time: 15.4 seconds — ABNORMAL HIGH (ref 11.4–15.2)

## 2018-10-02 LAB — PROCALCITONIN: Procalcitonin: 0.15 ng/mL

## 2018-10-02 LAB — CBC
HEMATOCRIT: 21.8 % — AB (ref 39.0–52.0)
Hemoglobin: 6.9 g/dL — CL (ref 13.0–17.0)
MCH: 28.2 pg (ref 26.0–34.0)
MCHC: 31.7 g/dL (ref 30.0–36.0)
MCV: 89 fL (ref 80.0–100.0)
Platelets: 245 10*3/uL (ref 150–400)
RBC: 2.45 MIL/uL — ABNORMAL LOW (ref 4.22–5.81)
RDW: 44.9 % — AB (ref 11.5–15.5)
WBC: 12.2 10*3/uL — ABNORMAL HIGH (ref 4.0–10.5)

## 2018-10-02 LAB — BASIC METABOLIC PANEL
Anion gap: 9 (ref 5–15)
BUN: 13 mg/dL (ref 6–20)
CO2: 27 mmol/L (ref 22–32)
Calcium: 6.9 mg/dL — ABNORMAL LOW (ref 8.9–10.3)
Chloride: 94 mmol/L — ABNORMAL LOW (ref 98–111)
Creatinine, Ser: 0.54 mg/dL — ABNORMAL LOW (ref 0.61–1.24)
GFR calc Af Amer: >60 mL/min (ref >60)
GFR calc non Af Amer: >60 mL/min (ref >60)
Glucose, Bld: 172 mg/dL — ABNORMAL HIGH (ref 70–99)
Potassium: 3.5 mmol/L (ref 3.5–5.1)
Sodium: 130 mmol/L — ABNORMAL LOW (ref 135–145)

## 2018-10-02 LAB — PREPARE RBC (CROSSMATCH)

## 2018-10-02 LAB — LACTIC ACID, PLASMA
Lactic Acid, Venous: 0.8 mmol/L (ref 0.5–1.9)
Lactic Acid, Venous: 0.8 mmol/L (ref 0.5–1.9)

## 2018-10-02 LAB — APTT: aPTT: 32 seconds (ref 24–36)

## 2018-10-02 MED ORDER — ACETAMINOPHEN 325 MG PO TABS
650.0000 mg | ORAL_TABLET | Freq: Once | ORAL | Status: AC
  Administered 2018-10-02: 650 mg via ORAL
  Filled 2018-10-02: qty 2

## 2018-10-02 MED ORDER — VANCOMYCIN HCL 10 G IV SOLR
1250.0000 mg | Freq: Once | INTRAVENOUS | Status: AC
  Administered 2018-10-02: 1250 mg via INTRAVENOUS
  Filled 2018-10-02: qty 1250

## 2018-10-02 MED ORDER — FUROSEMIDE 10 MG/ML IJ SOLN
20.0000 mg | Freq: Once | INTRAMUSCULAR | Status: AC
  Administered 2018-10-02: 20 mg via INTRAVENOUS
  Filled 2018-10-02: qty 2

## 2018-10-02 MED ORDER — SODIUM CHLORIDE 0.9% IV SOLUTION
Freq: Once | INTRAVENOUS | Status: AC
  Administered 2018-10-02: 14:00:00 via INTRAVENOUS

## 2018-10-02 MED ORDER — METRONIDAZOLE IN NACL 5-0.79 MG/ML-% IV SOLN: 500.0000 mg | Freq: Three times a day (TID) | INTRAVENOUS | Status: DC

## 2018-10-02 MED ORDER — SODIUM CHLORIDE 0.9 % IV SOLN
1.0000 g | Freq: Three times a day (TID) | INTRAVENOUS | Status: DC
  Administered 2018-10-02 – 2018-10-07 (×16): 1 g via INTRAVENOUS
  Filled 2018-10-02 (×16): qty 1

## 2018-10-02 MED ORDER — SODIUM CHLORIDE 0.9 % IV SOLN: 1.0000 g | Freq: Three times a day (TID) | INTRAVENOUS | Status: DC

## 2018-10-02 MED ORDER — SODIUM CHLORIDE 0.9 % IV BOLUS
500.0000 mL | Freq: Once | INTRAVENOUS | Status: AC
  Administered 2018-10-03: 500 mL via INTRAVENOUS

## 2018-10-02 MED ORDER — VANCOMYCIN HCL IN DEXTROSE 1-5 GM/200ML-% IV SOLN: 1000.0000 mg | Freq: Once | INTRAVENOUS | Status: DC

## 2018-10-02 MED ORDER — GUAIFENESIN ER 600 MG PO TB12
1200.0000 mg | ORAL_TABLET | Freq: Two times a day (BID) | ORAL | Status: DC
  Administered 2018-10-02 – 2018-10-15 (×21): 1200 mg via ORAL
  Filled 2018-10-02 (×33): qty 2

## 2018-10-02 MED ORDER — IPRATROPIUM-ALBUTEROL 0.5-2.5 (3) MG/3ML IN SOLN
3.0000 mL | Freq: Two times a day (BID) | RESPIRATORY_TRACT | Status: DC
  Administered 2018-10-02 – 2018-10-04 (×4): 3 mL via RESPIRATORY_TRACT
  Filled 2018-10-02 (×6): qty 3

## 2018-10-02 MED ORDER — SODIUM CHLORIDE 0.9 % IV SOLN: 2.0000 g | Freq: Once | INTRAVENOUS | Status: DC

## 2018-10-02 MED ORDER — DIPHENHYDRAMINE HCL 25 MG PO CAPS
25.0000 mg | ORAL_CAPSULE | Freq: Once | ORAL | Status: AC
  Administered 2018-10-02: 25 mg via ORAL
  Filled 2018-10-02: qty 1

## 2018-10-02 MED ORDER — IBUPROFEN 200 MG PO TABS: 400.0000 mg | ORAL_TABLET | Freq: Once | ORAL | Status: DC

## 2018-10-02 MED ORDER — VANCOMYCIN HCL IN DEXTROSE 750-5 MG/150ML-% IV SOLN
750.0000 mg | Freq: Two times a day (BID) | INTRAVENOUS | Status: DC
  Administered 2018-10-02 – 2018-10-04 (×4): 750 mg via INTRAVENOUS
  Filled 2018-10-02 (×5): qty 150

## 2018-10-02 MED ORDER — IPRATROPIUM-ALBUTEROL 0.5-2.5 (3) MG/3ML IN SOLN
3.0000 mL | Freq: Three times a day (TID) | RESPIRATORY_TRACT | Status: DC
  Administered 2018-10-02: 3 mL via RESPIRATORY_TRACT
  Filled 2018-10-02: qty 3

## 2018-10-02 MED ORDER — POTASSIUM CHLORIDE CRYS ER 20 MEQ PO TBCR
40.0000 meq | EXTENDED_RELEASE_TABLET | Freq: Once | ORAL | Status: AC
  Administered 2018-10-02: 40 meq via ORAL
  Filled 2018-10-02: qty 2

## 2018-10-02 MED ORDER — SODIUM CHLORIDE 0.9% FLUSH
10.0000 mL | INTRAVENOUS | Status: DC | PRN
  Administered 2018-10-06: 20 mL
  Administered 2018-10-12 – 2018-10-14 (×2): 10 mL
  Administered 2018-10-15: 20 mL
  Filled 2018-10-02 (×4): qty 40

## 2018-10-02 MED ORDER — SODIUM CHLORIDE 0.9 % IV SOLN
INTRAVENOUS | Status: DC
  Administered 2018-10-02: 10:00:00 via INTRAVENOUS

## 2018-10-02 MED ORDER — ACETAMINOPHEN 500 MG PO TABS
500.0000 mg | ORAL_TABLET | Freq: Once | ORAL | Status: DC
  Filled 2018-10-02: qty 1

## 2018-10-02 NOTE — Progress Notes (Signed)
Pharmacy Antibiotic Note  Ryan Medina is a 58 y.o. male admitted to [facility] on 09/03/2018 with ischemic right leg s/p embolectomy 12/13 and on heparin drip. Patient developed acute RLL hematoma with compartment syndrome requiring fasciotomy 12/15 with subsequent decompensation 12/17 moved to ICU for hemodynamic instability. Transferred out of ICU 12/18 and back on 1/5 for hypotension and recurrent anemia. Seen by oncology and transferred to [facility] for radiation therapy to pancreatic mass. Pharmacy has been consulted for vancomycin and cefepime dosing empirically for PNA.  Plan: Cefepime 1 g iv q 8 hours- agree with dose ordered per attending MD.  Vancomycin 1250 mg iv once then 750 mg iv q 12h. AUC 493 TBW/TBW.  F/U renal function, culture results, plans for abx  Height: 5\' 11"  (180.3 cm) Weight: 126 lb 15.8 oz (57.6 kg) IBW/kg (Calculated) : 75.3  Temp (24hrs), Avg:100.6 F (38.1 C), Min:99 F (37.2 C), Max:102.4 F (39.1 C)  Recent Labs  Lab 09/28/18 0449  09/29/18 0324 09/29/18 0717 09/29/18 1433 09/30/18 0317 10/01/18 0319 10/02/18 0559  WBC 17.8*   < > 14.0* 13.7* 12.7* 14.1* 18.7* 12.2*  CREATININE 0.56*  --  0.55*  --   --  0.51* 0.53* 0.54*   < > = values in this interval not displayed.    Estimated Creatinine Clearance: 83 mL/min (A) (by C-G formula based on SCr of 0.54 mg/dL (L)).    Allergies  Allergen Reactions  . Lisinopril Anaphylaxis    Antimicrobials this admission: 1/11 vancomycin >>  1/11 cefepime >>   Dose adjustments this admission:  Microbiology results: MC: 12/20 MRSA PCR: negative 1/5 BCx: NGF  WL: 1/10 BCx: NGTD 1/10 UCx:  Sputum:    Thank you for allowing pharmacy to be a part of this patient's care.  Ulice Dash D 10/02/2018 8:13 AM

## 2018-10-02 NOTE — Progress Notes (Signed)
PROGRESS NOTE    Ryan Medina  OEV:[id number] DOB: 1962-04-58 DOA: 09/03/2018 PCP: Patient, No Pcp Per   Brief Narrative:  58 yoM admitted with LLE pain found to have ischemic right leg and acute popliteal and tibial embolus s/p embolectomy 12/13. On heparin gtt. Developed acute RLL hematoma with compartment syndrome requiring fasciotomy 12/15. Decompensated 12/17 am with hypotension with abd distention and 2 massive bloody stools. Hgb drop 9.5 ->5.9. Getting 2 units PRBC, protonix, and GI consulted. Moved to ICU for hemodynamic instability. Had embolization of GDA 09/07/2018. Transferred out of ICU 12/18.  Transferred back to ICU 1/5 overnight for hypotension and recurrent anemia.  Patient transfused a total of 22 units packed red blood cells.  Patient seen by oncology and patient transferred to [facility] for radiation therapy to pancreatic mass.   Assessment & Plan:   Principal Problem:   Popliteal artery embolus (HCC) Active Problems:   HCAP (healthcare-associated pneumonia)   DM (diabetes mellitus), type 2, uncontrolled ([place])   Essential hypertension   Dilated cardiomyopathy (HCC)   Chest pain   AAA (abdominal aortic aneurysm) (HCC)   Ischemic foot   Right leg claudication (HCC)   PVD (peripheral vascular disease) ([place])   Hemorrhagic shock (HCC)   Nontraumatic ischemic infarction of muscle of right lower leg   Upper GI bleed   Cocaine abuse ([place])   Acute blood loss anemia   Chronic pain syndrome   Duodenal ulcer with hemorrhage   Pancreatic cancer (HCC)   Goals of care, counseling/discussion   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  Noted to be nonresectable.  Patient seen in consultation by oncology and patient transferred to [facility] long to start radiation treatment.  Chemotherapy on hold for now per oncology. Per Oncology patient will likely need to stay in-house for entirety of his radiation treatment.  Patient already started with radiation treatments. Palliative care consulted and also following.  Per oncology.  2.  Acute hemorrhagic shock secondary to acute GI bleed Patient noted to be in acute hemorrhagic shock requiring pressors and octreotide which has since been discontinued.  EGD from 09/09/2018 showed bleeding duodenal ulcer likely etiology of patient's bleed with concerns for malignancy from invasive pancreatic cancer despite negative biopsy results.  Patient status post recurrent bleeding despite previous EGD/hemo-spray 09/07/2018.  Patient status post GDA embolization 09/07/2018.  Endoscopic ulcer appearance on 09/16/2018 showed deeply cratered, friable, intermittent oozing blood, firm.  IR holding on repeat angiogram/embolization..  Systolic blood pressures in the low 100s.  Patient currently asymptomatic.  Patient with black/melanotic stools ongoing.  Hemoglobin slowly trickling down and currently at 6.9.  Patient likely slowly losing some blood.  Patient status post 22 units packed red blood cells.  Patient status post GDA embolization per IR 09/07/2018.  Patient s/p IV iron per hematology/oncology.  Octreotide has been discontinued.  Continue PPI twice daily.  Transfuse 2 units packed red blood cells.  Posttransfusion CBC.  Patient currently tolerating soft diet.  GI has reviewed chart again and feel patient's hemoglobin drifting down and dark stools not a surprise and expect this to continue.  GI recommending transfusion for  hemoglobin less than 7.  Follow.  3.  Rule out sepsis likely secondary to HCAP and prob UTI Patient noted to be febrile overnight with T-max of  102.4.  Patient also noted to have a leukocytosis of 18.7 on 10/01/2018.  Chest x-ray done on 10/01/2018 consistent with a new patchy left lower lung opacity likely representing pneumonia.  Check a sputum Gram stain and culture.  Check urine Legionella antigen.  Check a urine pneumococcus antigen.  Blood cultures have been ordered and are currently pending.  Urinalysis done on 10/01/2018 nitrite negative, leukocytes negative, many bacteria, 6-10 WBCs.  Urine cultures preliminary results with greater than 100,000 gram-negative rods.  Check a lactic acid level.  Check a procalcitonin.  Place empirically on IV vancomycin IV cefepime.  Gentle hydration as patient has received multiple units of packed red blood cells and to be transfused 2 more units of packed red blood cells today.  Tylenol as needed for fever.  Cooling blanket if no significant improvement with fever on Tylenol.  4.  Acute blood loss anemia Secondary to problem #2.  Status post 22 units packed red blood cells.  Hemoglobin currently at 6.9.  Hemoglobin trickling down.  Patient likely slowly losing blood from duodenal ulcer.  Transfuse 2 units packed red blood cells. Transfusion threshold hemoglobin less than 7.  Follow H&H.  5.  Extensive left popliteal artery embolus  status post embolectomy and fasciotomies of right lower extremity.  Staples have been removed.  Patient deemed not a candidate for anticoagulation due to GI bleed.  Pain management.  Per vascular surgery.  6.??  Small bowel obstruction Gastric distention noted on CT abdomen with some distention.  Resolved.  Patient with no nausea or vomiting.  Patient states having brown stool.  No need for NG tube at this time.  Diet has been advanced to a soft diet which patient is tolerating.  Follow.    7.  Chronic systolic heart failure/diastolic cardiomyopathy Complicated by cocaine use.  Currently stable.  Supportive care.  8.  Cocaine abuse Patient counseled on cessation.  9.   Diabetes mellitus II Hemoglobin A1c 18.2 on 08/23/2018. CBG of 193 today.  Diet has been advanced to a soft diet.  Continue Lantus 10 units daily.  Sliding scale insulin.    10.  Tachycardia Patient denies any chest pain.  EKG with a sinus tachycardia.  TSH within normal limits at 2.551.  Heart rate improved on current regimen of Coreg.  Follow.    DVT prophylaxis: Recent GI bleed not a anticoagulation candidate at this time. Code Status: Partial Family Communication: Updated patient and fianc at bedside. Disposition Plan: Patient will need to stay in-house for completion of radiation treatment and when okay with oncology.   Significant Hospital Events   12/13 Admitted, right popliteal and tibial embolectomy with patch angioplasty 12/15 OR for evacuation of hematoma and lymphocele right leg, 4 compartment fasciotomy, placement of vac 12/17 GI and IR consult. Taken to IR for GDA embolization 12/19 EGD with hemospray applied to duodenal ulcer 12/20 OR for closure of medial and lateral fasciotomy incisions 12/21 CT A/P with pancreatic mass 12/23 CIR consult 12/26 EUS that showed pancreatic adenocarcinoma 12/30 oncology consult 1/2 radiation therapy feels XRT would not be of much benefit 1/4 IR consulted for port a cath placement 1/5 transferred back to ICU for hypotension and recurrent anemia. Since admit, had 14u PRBC's with additional 4 ordered 1/6 CT pancreas ordered by oncology 10/02/2018-To transfuse 2 units packed red blood  cells     Consultants:  Vascular GI PCCM Oncology / radiation onc IR Palliative  Procedures:  TTE 12/14 >EF 45-50%, G1DD. CTA PE 12/16 >No evidence of thoracic aortic aneurysm or dissection.Small bilateral effusions with associated bibasilarconsolidation. CTA A/P 12/21 >new pancreatic mass. Occluded GDA after coil embolization. Stable aneurysmal disease though has significant increase in mural thrombus within the dilated common iliac arteries  bilaterally. MRI abd 12/22 > pancreatic neck mass c/w adenocarcinoma EUS 12/26 >pancreatic adenocarcinoma. Duodenal ulcer not c/w malignancy. TEE 12/26 > EF 50-55%, mild MR, no thrombus, negative bubble study. Renal US 1/2 > negative for hydro or acute process. CT pancreas 1/6 >Distention of gastric lumen to antrum, increase in size of pancreatic neck mass with involvement of superior mesenteric and celiac artery  12/13 LLE embolectomy  12/15 RLE fasciotomy  09/07/2018 intubation>> 09/07/2018 interventional radiology perform mesenteric angiography and cold embolization of the GDA    Antimicrobials:   IV vancomycin 10/02/2018  IV cefepime 10/02/2018   Subjective: Patient sitting up in bed.  Denies any chest pain or shortness of breath.  Denies any coughing.  Patient noted to have fevers as high as 102 overnight.  Patient states has some black stools and then also some brown stools.  Tolerating current diet.  Pain in right lower extremity.   Objective: Vitals:   10/02/18 1316 10/02/18 1324 10/02/18 1345 10/02/18 1503  BP:    (!) 100/56  Pulse:    (!) 103  Resp:    16  Temp:  (!) 100.4 F (38 C) (!) 100.7 F (38.2 C) (!) 101.7 F (38.7 C)  TempSrc:  Oral Rectal Oral  SpO2: 96%   94%  Weight:      Height:        Intake/Output Summary (Last 24 hours) at 10/02/2018 1556 Last data filed at 10/02/2018 1216 Gross per 24 hour  Intake 1280 ml  Output 200 ml  Net 1080 ml   Filed Weights   09/16/18 1325 09/28/18 1500 09/28/18 1914  Weight: 66.7 kg 59 kg 57.6 kg    Examination:  General exam: NAD.  Frail.  Cachectic. Respiratory system: Left basilar coarse BS. No wheezes, no crackles.  Normal respiratory effort.  Cardiovascular system: RRR no murmurs rubs or gallops.  No JVD.  No lower extremity edema.   Gastrointestinal system: Abdomen is soft, nontender, nondistended, positive bowel sounds.  No rebound.  No guarding.  Central nervous system: Alert and oriented. No focal  neurological deficits. Extremities: Symmetric 5 x 5 power. Skin: No rashes, lesions or ulcers Psychiatry: Judgement and insight appear normal. Mood & affect appropriate.     Data Reviewed: I have personally reviewed following labs and imaging studies  CBC: Recent Labs  Lab 09/29/18 0717 09/29/18 1433 09/30/18 0317 09/30/18 1627 10/01/18 0319 10/01/18 1351 10/02/18 0559  WBC 13.7* 12.7* 14.1*  --  18.7*  --  12.2*  HGB 8.6* 7.8* 8.4* 7.9* 7.4* 7.5* 6.9*  HCT 26.3* 23.7* 25.6* 24.6* 23.2* 23.2* 21.8*  MCV 88.9 86.2 88.0  --  89.9  --  89.0  PLT 232 229 193  --  230  --  532   Basic Metabolic Panel: Recent Labs  Lab 09/26/18 0413  09/28/18 0449 09/29/18 0324 09/30/18 0317 10/01/18 0319 10/02/18 0559  NA 134*   < > 135 134* 135 133* 130*  K 4.0   < > 3.9 3.5 3.9 3.8 3.5  CL 100   < > 107 99 99 98 94*  CO2  25   < > 22 27 26 26 27   GLUCOSE 50*   < > 186* 186* 169* 214* 172*  BUN 14   < > 44* 25* 14 13 13   CREATININE 0.67   < > 0.56* 0.55* 0.51* 0.53* 0.54*  CALCIUM 8.2*   < > 7.4* 7.6* 7.3* 7.1* 6.9*  MG 1.7  --  1.6* 1.9 1.5* 2.0  --   PHOS  --   --  2.5 2.5  --   --   --    < > = values in this interval not displayed.   GFR: Estimated Creatinine Clearance: 83 mL/min (A) (by C-G formula based on SCr of 0.54 mg/dL (L)). Liver Function Tests: Recent Labs  Lab 09/25/18 2233 09/26/18 0413  AST 15 15  ALT 19 20  ALKPHOS 63 68  BILITOT 0.4 0.5  PROT 5.9* 5.9*  ALBUMIN 1.8* 1.8*   No results for input(s): LIPASE, AMYLASE in the last 168 hours. No results for input(s): AMMONIA in the last 168 hours. Coagulation Profile: Recent Labs  Lab 09/27/18 0616 10/02/18 0910  INR 1.50 1.23   Cardiac Enzymes: Recent Labs  Lab 09/25/18 2233  TROPONINI <0.03   BNP (last 3 results) No results for input(s): PROBNP in the last 8760 hours. HbA1C: No results for input(s): HGBA1C in the last 72 hours. CBG: Recent Labs  Lab 10/01/18 0829 10/01/18 1249 10/01/18 1618  10/01/18 2144 10/02/18 0749  GLUCAP 245* 196* 162* 76 193*   Lipid Profile: No results for input(s): CHOL, HDL, LDLCALC, TRIG, CHOLHDL, LDLDIRECT in the last 72 hours. Thyroid Function Tests: Recent Labs    10/01/18 0319  TSH 2.551   Anemia Panel: No results for input(s): VITAMINB12, FOLATE, FERRITIN, TIBC, IRON, RETICCTPCT in the last 72 hours. Sepsis Labs: Recent Labs  Lab 10/02/18 0910 10/02/18 1225  PROCALCITON 0.15  --   LATICACIDVEN 0.8 0.8    Recent Results (from the past 240 hour(s))  Culture, blood (Routine X 2) w Reflex to ID Panel     Status: None   Collection Time: 09/26/18  7:35 PM  Result Value Ref Range Status   Specimen Description BLOOD RIGHT ANTECUBITAL  Final   Special Requests IN PEDIATRIC BOTTLE Blood Culture adequate volume  Final   Culture   Final    NO GROWTH 5 DAYS Performed at [facility], [place] [address] [place], [place] [address]    Report Status 10/01/2018 FINAL  Final  Culture, blood (Routine X 2) w Reflex to ID Panel     Status: None (Preliminary result)   Collection Time: 10/01/18  9:29 AM  Result Value Ref Range Status   Specimen Description BLOOD RIGHT HAND  Final   Special Requests   Final    BOTTLES DRAWN AEROBIC ONLY Blood Culture results may not be optimal due to an inadequate volume of blood received in culture bottles Performed at [facility], [place] [address] [place], [place] [address]    Culture NO GROWTH 1 DAY  Final   Report Status PENDING  Incomplete  Culture, blood (Routine X 2) w Reflex to ID Panel     Status: None (Preliminary result)   Collection Time: 10/01/18  9:29 AM  Result Value Ref Range Status   Specimen Description BLOOD RIGHT ARM  Final   Special Requests   Final    BOTTLES DRAWN AEROBIC AND ANAEROBIC Blood Culture adequate volume Performed at [place] [address] [place],  [place] [address]    Culture NO GROWTH 1 DAY  Final   Report Status PENDING   Incomplete  Culture, Urine     Status: Abnormal (Preliminary result)   Collection Time: 10/01/18  2:12 PM  Result Value Ref Range Status   Specimen Description URINE, RANDOM  Final   Special Requests   Final    NONE Performed at [facility], [place] [address], [place],  [address]    Culture >=100,000 COLONIES/mL GRAM NEGATIVE RODS (A)  Final   Report Status PENDING  Incomplete         Radiology Studies: Dg Chest Port 1 View  Result Date: 10/01/2018 CLINICAL DATA:  58 year old male with leukocytosis. EXAM: PORTABLE CHEST 1 VIEW COMPARISON:  09/28/2018 and prior chest radiographs FINDINGS: The cardiomediastinal silhouette is unremarkable. New patchy LEFT LOWER lung opacity likely represents pneumonia. No pleural effusion, pneumothorax or acute bony abnormality. IMPRESSION: New patchy LEFT LOWER lung opacity likely representing pneumonia. Electronically Signed   By: Margarette Canada M.D.   On: 10/01/2018 09:53   [place] Ekg Site Rite  Result Date: 10/02/2018 If Site Rite image not attached, placement could not be confirmed due to current cardiac rhythm.       Scheduled Meds: . sodium chloride   Intravenous Once  . atorvastatin  20 mg Oral QHS  . carvedilol  3.125 mg Oral BID WC  . dronabinol  5 mg Oral BID AC  . feeding supplement (ENSURE ENLIVE)  237 mL Oral BID BM  . furosemide  20 mg Intravenous Once  . gabapentin  300 mg Oral TID  . guaiFENesin  1,200 mg Oral BID  . insulin aspart  0-9 Units Subcutaneous TID AC & HS  . insulin glargine  10 Units Subcutaneous q morning - 10a  . ipratropium-albuterol  3 mL Nebulization BID  . lidocaine  15 mL Oral Once  . LORazepam  1 mg Intravenous Once  . mouth rinse  15 mL Mouth Rinse BID  . pantoprazole  40 mg Oral BID  . senna-docusate  1 tablet Oral BID  . tamsulosin  0.4 mg Oral QHS   Continuous Infusions: . ceFEPime (MAXIPIME) IV 1 g (10/02/18 1215)  . vancomycin       LOS: 29 days    Time spent: 40  minutes    Irine Seal, MD [facility]  If 7PM-7AM, please contact night-coverage [email or web address] 10/02/2018, 3:56 PM

## 2018-10-02 NOTE — Progress Notes (Signed)
Peripherally Inserted Central Catheter/Midline Placement  The IV Nurse has discussed with the patient and/or persons authorized to consent for the patient, the purpose of this procedure and the potential benefits and risks involved with this procedure.  The benefits include less needle sticks, lab draws from the catheter, and the patient may be discharged home with the catheter. Risks include, but not limited to, infection, bleeding, blood clot (thrombus formation), and puncture of an artery; nerve damage and irregular heartbeat and possibility to perform a PICC exchange if needed/ordered by physician.  Alternatives to this procedure were also discussed.  Bard Power PICC patient education guide, fact sheet on infection prevention and patient information card has been provided to patient /or left at bedside.    PICC/Midline Placement Documentation  PICC Double Lumen 49/67/59 PICC Right Basilic 37 cm 0 cm (Active)  Indication for Insertion or Continuance of Line Limited venous access - need for IV therapy >5 days (PICC only) 10/02/2018  4:00 PM  Exposed Catheter (cm) 0 cm 10/02/2018  4:00 PM  Site Assessment Clean;Dry;Intact 10/02/2018  4:00 PM  Lumen #1 Status Flushed;Blood return noted;Saline locked 10/02/2018  4:00 PM  Lumen #2 Status Flushed;Blood return noted;Saline locked 10/02/2018  4:00 PM  Dressing Type Transparent 10/02/2018  4:00 PM  Dressing Status Clean;Dry;Intact;Antimicrobial disc in place 10/02/2018  4:00 PM  Line Care Connections checked and tightened 10/02/2018  4:00 PM  Line Adjustment (NICU/IV Team Only) No 10/02/2018  4:00 PM  Dressing Intervention New dressing 10/02/2018  4:00 PM  Dressing Change Due 10/09/18 10/02/2018  4:00 PM       Rolena Infante 10/02/2018, 5:01 PM

## 2018-10-02 NOTE — Progress Notes (Signed)
Rectal temp now 100.7 after pre-med Tylenol 650 mg given at 1215. Blood has not been started. MD Grandville Silos made aware- and ordered another dose of Tylenol 650 mg PO and to give blood as ordered. Will continue to monitor.

## 2018-10-02 NOTE — Progress Notes (Signed)
NP on call made aware of patient's low grade fever of 100.2 despite giving Tylenol 650 mg. Patient is to receive 2 units PRBC and has received 1 unit so far. Per [facility] policy, provider should be made aware of patient's temperature if greater than 100.4. Given recent history of fever greater than 102, and poor response to Tylenol, this RN reluctant to give blood at this time. Will continue to monitor pt closely and carry out any new orders. Carnella [place] I

## 2018-10-02 NOTE — Progress Notes (Signed)
CRITICAL VALUE ALERT  Critical Value:  Hgb 6.9  Date & Time Notied:  10/02/2018 @ 8871  Provider Notified: Grandville Silos, MD  Orders Received/Actions taken: Awaiting orders.

## 2018-10-02 NOTE — Progress Notes (Signed)
Cooling blanket cannot be applied on this floor per protocol. RN attempted to place multiple ice packs around pt- pt adamantly refused. Room temperature is very warm and pt is under a blanket. Pt and gf refuse to decrease room temp and remove blanket to assist with decreasing fever. MD paged multiple times about pt's increasing temperature and delay in blood transfusion.

## 2018-10-03 LAB — GLUCOSE, CAPILLARY
GLUCOSE-CAPILLARY: 50 mg/dL — AB (ref 70–99)
GLUCOSE-CAPILLARY: 57 mg/dL — AB (ref 70–99)
Glucose-Capillary: 140 mg/dL — ABNORMAL HIGH (ref 70–99)
Glucose-Capillary: 231 mg/dL — ABNORMAL HIGH (ref 70–99)
Glucose-Capillary: 161 mg/dL — ABNORMAL HIGH (ref 70–99)
Glucose-Capillary: 50 mg/dL — ABNORMAL LOW (ref 70–99)
Glucose-Capillary: 53 mg/dL — ABNORMAL LOW (ref 70–99)
Glucose-Capillary: 52 mg/dL — ABNORMAL LOW (ref 70–99)
Glucose-Capillary: 55 mg/dL — ABNORMAL LOW (ref 70–99)
Glucose-Capillary: 139 mg/dL — ABNORMAL HIGH (ref 70–99)

## 2018-10-03 LAB — CBC WITH DIFFERENTIAL/PLATELET
Abs Immature Granulocytes: 0.09 10*3/uL — ABNORMAL HIGH (ref 0.00–0.07)
Basophils Absolute: 0 10*3/uL (ref 0.0–0.1)
Basophils Relative: 0 %
Eosinophils Absolute: 0 10*3/uL (ref 0.0–0.5)
Eosinophils Relative: 0 %
HEMATOCRIT: 28.4 % — AB (ref 39.0–52.0)
Hemoglobin: 9.2 g/dL — ABNORMAL LOW (ref 13.0–17.0)
Immature Granulocytes: 1 %
Lymphocytes Relative: 5 %
Lymphs Abs: 0.6 10*3/uL — ABNORMAL LOW (ref 0.7–4.0)
MCH: 28.4 pg (ref 26.0–34.0)
MCHC: 32.4 g/dL (ref 30.0–36.0)
MCV: 87.7 fL (ref 80.0–100.0)
MONO ABS: 0.7 10*3/uL (ref 0.1–1.0)
Monocytes Relative: 6 %
Neutro Abs: 10.8 10*3/uL — ABNORMAL HIGH (ref 1.7–7.7)
Neutrophils Relative %: 88 %
Platelets: 310 10*3/uL (ref 150–400)
RBC: 3.24 MIL/uL — ABNORMAL LOW (ref 4.22–5.81)
RDW: 13.8 % (ref 11.5–15.5)
WBC: 12.3 10*3/uL — ABNORMAL HIGH (ref 4.0–10.5)
nRBC: 0 % (ref 0.0–0.2)

## 2018-10-03 LAB — BASIC METABOLIC PANEL
Anion gap: 7 (ref 5–15)
BUN: 11 mg/dL (ref 6–20)
CO2: 29 mmol/L (ref 22–32)
Calcium: 7 mg/dL — ABNORMAL LOW (ref 8.9–10.3)
Chloride: 98 mmol/L (ref 98–111)
Creatinine, Ser: 0.53 mg/dL — ABNORMAL LOW (ref 0.61–1.24)
GFR calc Af Amer: >60 mL/min (ref >60)
GFR calc non Af Amer: >60 mL/min (ref >60)
GLUCOSE: 246 mg/dL — AB (ref 70–99)
Potassium: 3.7 mmol/L (ref 3.5–5.1)
Sodium: 134 mmol/L — ABNORMAL LOW (ref 135–145)

## 2018-10-03 LAB — URINE CULTURE

## 2018-10-03 LAB — HIV ANTIBODY (ROUTINE TESTING W REFLEX): HIV SCREEN 4TH GENERATION: NONREACTIVE

## 2018-10-03 LAB — LEGIONELLA PNEUMOPHILA SEROGP 1 UR AG: L. pneumophila Serogp 1 Ur Ag: NEGATIVE

## 2018-10-03 LAB — PREPARE RBC (CROSSMATCH)

## 2018-10-03 MED ORDER — GLUCOSE 40 % PO GEL
ORAL | Status: AC
  Filled 2018-10-03: qty 1

## 2018-10-03 MED ORDER — GLUCOSE 40 % PO GEL
1.0000 | Freq: Once | ORAL | Status: AC
  Administered 2018-10-04: 37.5 g via ORAL
  Filled 2018-10-03: qty 1

## 2018-10-03 MED ORDER — SODIUM CHLORIDE 0.9% IV SOLUTION: Freq: Once | INTRAVENOUS | Status: DC

## 2018-10-03 MED ORDER — DEXTROSE 50 % IV SOLN
25.0000 mL | Freq: Once | INTRAVENOUS | Status: AC
  Administered 2018-10-03: 25 mL via INTRAVENOUS
  Filled 2018-10-03: qty 50

## 2018-10-03 NOTE — Progress Notes (Signed)
Provider on call made aware of patient's expired type and screen and request for a new order for one. Second unit of blood delayed for this reason. Will give second unit once Temp < 100 and appropriate labs have been drawn. Ryan Medina I

## 2018-10-03 NOTE — Progress Notes (Signed)
Patient refusing to keep telemetry monitor on, keeps pulling it off, and becomes irritable when staff reapplies, paged MD to see if telemetry could be discharged, return call pending

## 2018-10-03 NOTE — Progress Notes (Signed)
PROGRESS NOTE    Ryan Medina  ZOX:[id number] DOB: Aug 19, 1961 DOA: 09/03/2018 PCP: Patient, No Pcp Per   Brief Narrative:  4 yoM admitted with LLE pain found to have ischemic right leg and acute popliteal and tibial embolus s/p embolectomy 12/13. On heparin gtt. Developed acute RLL hematoma with compartment syndrome requiring fasciotomy 12/15. Decompensated 12/17 am with hypotension with abd distention and 2 massive bloody stools. Hgb drop 9.5 ->5.9. Getting 2 units PRBC, protonix, and GI consulted. Moved to ICU for hemodynamic instability. Had embolization of GDA 09/07/2018. Transferred out of ICU 12/18.  Transferred back to ICU 1/5 overnight for hypotension and recurrent anemia.  Patient transfused a total of 22 units packed red blood cells.  Patient seen by oncology and patient transferred to [facility] for radiation therapy to pancreatic mass.   Assessment & Plan:   Principal Problem:   Popliteal artery embolus (HCC) Active Problems:   HCAP (healthcare-associated pneumonia)   DM (diabetes mellitus), type 2, uncontrolled ([place])   Essential hypertension   Dilated cardiomyopathy (HCC)   Chest pain   AAA (abdominal aortic aneurysm) (HCC)   Ischemic foot   Right leg claudication (HCC)   PVD (peripheral vascular disease) ([place])   Hemorrhagic shock (HCC)   Nontraumatic ischemic infarction of muscle of right lower leg   Upper GI bleed   Cocaine abuse ([place])   Acute blood loss anemia   Chronic pain syndrome   Duodenal ulcer with hemorrhage   Pancreatic cancer (HCC)   Goals of care, counseling/discussion   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  Noted to be nonresectable.  Patient seen in consultation by oncology and patient transferred to [facility] long to start radiation treatment.  Chemotherapy on hold for now per oncology. Per Oncology patient will likely need to stay in-house for entirety of his radiation treatment.  Patient already started with radiation treatments. Palliative care consulted and also following.  Per oncology.  2.  Acute hemorrhagic shock secondary to acute GI bleed Patient noted to be in acute hemorrhagic shock requiring pressors and octreotide which has since been discontinued.  EGD from 09/09/2018 showed bleeding duodenal ulcer likely etiology of patient's bleed with concerns for malignancy from invasive pancreatic cancer despite negative biopsy results.  Patient status post recurrent bleeding despite previous EGD/hemo-spray 09/07/2018.  Patient status post GDA embolization 09/07/2018.  Endoscopic ulcer appearance on 09/16/2018 showed deeply cratered, friable, intermittent oozing blood, firm.  IR holding on repeat angiogram/embolization..  Systolic blood pressures in the low 100s.  Patient currently asymptomatic.  Patient with black/melanotic stools ongoing.  Hemoglobin slowly trickling down and currently at 9.2 from 6.9 (10/02/2018) after 2 units of packed red blood cells.  Patient likely slowly losing some blood.  Patient status post 24 units packed red blood cells.  Patient status post GDA embolization per IR 09/07/2018.  Patient s/p IV iron per hematology/oncology.  Octreotide has been discontinued.  Continue PPI twice daily. Patient currently tolerating soft diet.  GI has reviewed chart again and feel patient's hemoglobin drifting down and dark stools not a surprise and expect this to continue.  GI recommending transfusion for hemoglobin  less than 7.  Follow.  3.  Rule out sepsis likely secondary to HCAP and pseudomonas aeruginosa UTI Patient noted to be febrile the night  of 10/01/2018, with T-max of 102.4.  Patient also noted to have a leukocytosis of 18.7 on 10/01/2018.  Chest x-ray done on 10/01/2018 consistent with a new patchy left lower lung opacity likely representing pneumonia.  Fever curve trending down.  Sputum Gram stain and cultures pending.  Urine Legionella antigen negative.  Urine pneumococcus antigen negative.  Blood cultures reordered and pending.  Urine cultures with greater than 100,000 pseudomonas aeruginosa.  Continue empiric IV vancomycin IV cefepime.  Tylenol as needed.  If blood cultures continue to remain negative tomorrow we will discontinue IV vancomycin and monitor on IV cefepime pending final sensitivities of urine cultures.    4.  Acute blood loss anemia Secondary to problem #2.  Status post 24 units packed red blood cells.  Hemoglobin was at 6.9 on 10/02/2018 patient transfused 2 units packed red blood cells hemoglobin currently at 9.2.  Patient likely slowly losing blood from duodenal ulcer. Transfusion threshold hemoglobin less than 7.  Follow H&H.  5.  Extensive left popliteal artery embolus  status post embolectomy and fasciotomies of right lower extremity.  Staples have been removed.  Patient deemed not a candidate for anticoagulation due to GI bleed.  Pain management.  Per vascular surgery.  6.??  Small bowel obstruction Gastric distention noted on CT abdomen with some distention.  Resolved.  Patient with no nausea or vomiting.  Patient states having brown stool.  No need for NG tube at this time.  Diet has been advanced to a soft diet which patient is tolerating.  Follow.    7.  Chronic systolic heart failure/diastolic cardiomyopathy Complicated by cocaine use.  Currently stable.  Supportive care.  8.  Cocaine abuse Patient counseled on cessation.  9.  Diabetes mellitus II Hemoglobin A1c 18.2 on 08/23/2018. CBG of 231 today.  Diet has been advanced to a soft diet.  Continue Lantus 10 units daily.  Sliding scale insulin.    10.   Tachycardia Patient denies any chest pain.  EKG with a sinus tachycardia.  TSH within normal limits at 2.551.  Heart rate improved on current regimen of Coreg.  Follow.    DVT prophylaxis: Recent GI bleed not a anticoagulation candidate at this time. Code Status: Partial Family Communication: Updated patient and fianc at bedside. Disposition Plan: Patient will need to stay in-house for completion of radiation treatment and when okay with oncology.   Significant Hospital Events   12/13 Admitted, right popliteal and tibial embolectomy with patch angioplasty 12/15 OR for evacuation of hematoma and lymphocele right leg, 4 compartment fasciotomy, placement of vac 12/17 GI and IR consult. Taken to IR for GDA embolization 12/19 EGD with hemospray applied to duodenal ulcer 12/20 OR for closure of medial and lateral fasciotomy incisions 12/21 CT A/P with pancreatic mass 12/23 CIR consult 12/26 EUS that showed pancreatic adenocarcinoma 12/30 oncology consult 1/2 radiation therapy feels XRT would not be of much benefit 1/4 IR consulted for port a cath placement 1/5 transferred back to ICU for hypotension and recurrent anemia. Since admit, had 14u PRBC's with additional 4 ordered 1/6 CT pancreas ordered by oncology 10/02/2018-To transfuse 2 units packed red blood cells     Consultants:  Vascular GI PCCM Oncology / radiation onc IR Palliative  Procedures:  TTE 12/14 >EF 45-50%, G1DD. CTA PE 12/16 >No evidence of thoracic aortic aneurysm or dissection.Small  bilateral effusions with associated bibasilarconsolidation. CTA A/P 12/21 >new pancreatic mass. Occluded GDA after coil embolization. Stable aneurysmal disease though has significant increase in mural thrombus within the dilated common iliac arteries bilaterally. MRI abd 12/22 > pancreatic neck mass c/w adenocarcinoma EUS 12/26 >pancreatic adenocarcinoma. Duodenal ulcer not c/w malignancy. TEE 12/26 > EF 50-55%, mild MR, no  thrombus, negative bubble study. Renal US 1/2 > negative for hydro or acute process. CT pancreas 1/6 >Distention of gastric lumen to antrum, increase in size of pancreatic neck mass with involvement of superior mesenteric and celiac artery  12/13 LLE embolectomy  12/15 RLE fasciotomy  09/07/2018 intubation>> 09/07/2018 interventional radiology perform mesenteric angiography and cold embolization of the GDA.    Antimicrobials:   IV vancomycin 10/02/2018  IV cefepime 10/02/2018   Subjective: Patient sleeping however easily arousable.  Denies any chest pain or shortness of breath.  States still having some black stool however some brown stool also noted.  No coughing.  Fever curve trending down.  Tolerating current diet.  Pain in the right lower extremity.  Asking whether he can get a foot brace.  Per RN patient asking for telemetry to be removed.  Objective: Vitals:   10/03/18 0352 10/03/18 0407 10/03/18 0540 10/03/18 1004  BP: 112/74 104/73 110/76 117/86  Pulse: 93 91 93 98  Resp: 16 18 18  (!) 22  Temp: 99.3 F (37.4 C) 99.8 F (37.7 C) 100.2 F (37.9 C) 99.8 F (37.7 C)  TempSrc: Oral Oral Oral Oral  SpO2: 97% 97% 97% 98%  Weight:      Height:        Intake/Output Summary (Last 24 hours) at 10/03/2018 1446 Last data filed at 10/03/2018 1235 Gross per 24 hour  Intake 2989 ml  Output 1875 ml  Net 1114 ml   Filed Weights   09/16/18 1325 09/28/18 1500 09/28/18 1914  Weight: 66.7 kg 59 kg 57.6 kg    Examination:  General exam: NAD.  Frail.  Cachectic. Respiratory system: Coarse breath sounds left base.  No wheezes, no crackles.  Normal respiratory effort.  Speaking in full sentences.   Cardiovascular system: Regular rate and rhythm no murmurs rubs or gallops.  No JVD.  No lower extremity edema. Gastrointestinal system: Abdomen is nontender, nondistended, soft, positive bowel sounds.  No rebound.  No guarding.   Central nervous system: Alert and oriented. No focal  neurological deficits. Extremities: Symmetric 5 x 5 power. Skin: No rashes, lesions or ulcers Psychiatry: Judgement and insight appear normal. Mood & affect appropriate.     Data Reviewed: I have personally reviewed following labs and imaging studies  CBC: Recent Labs  Lab 09/29/18 1433 09/30/18 0317 09/30/18 1627 10/01/18 0319 10/01/18 1351 10/02/18 0559 10/03/18 0904  WBC 12.7* 14.1*  --  18.7*  --  12.2* 12.3*  NEUTROABS  --   --   --   --   --   --  10.8*  HGB 7.8* 8.4* 7.9* 7.4* 7.5* 6.9* 9.2*  HCT 23.7* 25.6* 24.6* 23.2* 23.2* 21.8* 28.4*  MCV 86.2 88.0  --  89.9  --  89.0 87.7  PLT 229 193  --  230  --  245 465   Basic Metabolic Panel: Recent Labs  Lab 09/28/18 0449 09/29/18 0324 09/30/18 0317 10/01/18 0319 10/02/18 0559 10/03/18 0904  NA 135 134* 135 133* 130* 134*  K 3.9 3.5 3.9 3.8 3.5 3.7  CL 107 99 99 98 94* 98  CO2 22 27 26 26 27  29  GLUCOSE 186* 186* 169* 214* 172* 246*  BUN 44* 25* 14 13 13 11   CREATININE 0.56* 0.55* 0.51* 0.53* 0.54* 0.53*  CALCIUM 7.4* 7.6* 7.3* 7.1* 6.9* 7.0*  MG 1.6* 1.9 1.5* 2.0  --   --   PHOS 2.5 2.5  --   --   --   --    GFR: Estimated Creatinine Clearance: 83 mL/min (A) (by C-G formula based on SCr of 0.53 mg/dL (L)). Liver Function Tests: No results for input(s): AST, ALT, ALKPHOS, BILITOT, PROT, ALBUMIN in the last 168 hours. No results for input(s): LIPASE, AMYLASE in the last 168 hours. No results for input(s): AMMONIA in the last 168 hours. Coagulation Profile: Recent Labs  Lab 09/27/18 0616 10/02/18 0910  INR 1.50 1.23   Cardiac Enzymes: No results for input(s): CKTOTAL, CKMB, CKMBINDEX, TROPONINI in the last 168 hours. BNP (last 3 results) No results for input(s): PROBNP in the last 8760 hours. HbA1C: No results for input(s): HGBA1C in the last 72 hours. CBG: Recent Labs  Lab 10/02/18 1245 10/02/18 1557 10/02/18 2026 10/03/18 0735 10/03/18 1110  GLUCAP 140* 249* 204* 231* 161*   Lipid  Profile: No results for input(s): CHOL, HDL, LDLCALC, TRIG, CHOLHDL, LDLDIRECT in the last 72 hours. Thyroid Function Tests: Recent Labs    10/01/18 0319  TSH 2.551   Anemia Panel: No results for input(s): VITAMINB12, FOLATE, FERRITIN, TIBC, IRON, RETICCTPCT in the last 72 hours. Sepsis Labs: Recent Labs  Lab 10/02/18 0910 10/02/18 1225  PROCALCITON 0.15  --   LATICACIDVEN 0.8 0.8    Recent Results (from the past 240 hour(s))  Culture, blood (Routine X 2) w Reflex to ID Panel     Status: None   Collection Time: 09/26/18  7:35 PM  Result Value Ref Range Status   Specimen Description BLOOD RIGHT ANTECUBITAL  Final   Special Requests IN PEDIATRIC BOTTLE Blood Culture adequate volume  Final   Culture   Final    NO GROWTH 5 DAYS Performed at [facility], [place] [address] [place], [place] [address]    Report Status 10/01/2018 FINAL  Final  Culture, blood (Routine X 2) w Reflex to ID Panel     Status: None (Preliminary result)   Collection Time: 10/01/18  9:29 AM  Result Value Ref Range Status   Specimen Description   Final    BLOOD RIGHT HAND Performed at [facility] [address] [place], [place] [address]    Special Requests   Final    BOTTLES DRAWN AEROBIC ONLY Blood Culture results may not be optimal due to an inadequate volume of blood received in culture bottles Performed at [facility] [address], [place], [place] [address]    Culture   Final    NO GROWTH 2 DAYS Performed at [facility] [address] [place], [place] [address]    Report Status PENDING  Incomplete  Culture, blood (Routine X 2) w Reflex to ID Panel     Status: None (Preliminary result)   Collection Time: 10/01/18  9:29 AM  Result Value Ref Range Status   Specimen Description   Final    BLOOD RIGHT ARM Performed at [facility] [address] [place], [place] [address]    Special Requests   Final    BOTTLES DRAWN  AEROBIC AND ANAEROBIC Blood Culture adequate volume Performed at [facility] [address], [place], [place] [address]  Culture   Final    NO GROWTH 2 DAYS Performed at [facility], [place] [address] [place], [place] [address]    Report Status PENDING  Incomplete  Culture, Urine     Status: Abnormal   Collection Time: 10/01/18  2:12 PM  Result Value Ref Range Status   Specimen Description   Final    URINE, RANDOM Performed at [facility] [address] [place], [place] [address]    Special Requests   Final    NONE Performed at [facility], [place] [address] [place], [place] [address]    Culture >=100,000 COLONIES/mL PSEUDOMONAS AERUGINOSA (A)  Final   Report Status 10/03/2018 FINAL  Final   Organism ID, Bacteria PSEUDOMONAS AERUGINOSA (A)  Final      Susceptibility   Pseudomonas aeruginosa - MIC*    CEFTAZIDIME 4 SENSITIVE Sensitive     CIPROFLOXACIN <=0.25 SENSITIVE Sensitive     GENTAMICIN <=1 SENSITIVE Sensitive     IMIPENEM 1 SENSITIVE Sensitive     PIP/TAZO <=4 SENSITIVE Sensitive     CEFEPIME <=1 SENSITIVE Sensitive     * >=100,000 COLONIES/mL PSEUDOMONAS AERUGINOSA         Radiology Studies: [place] Ekg Site Rite  Result Date: 10/02/2018 If Site Rite image not attached, placement could not be confirmed due to current cardiac rhythm.       Scheduled Meds: . sodium chloride   Intravenous Once  . sodium chloride   Intravenous Once  . acetaminophen  500 mg Oral Once  . atorvastatin  20 mg Oral QHS  . carvedilol  3.125 mg Oral BID WC  . dronabinol  5 mg Oral BID AC  . feeding supplement (ENSURE ENLIVE)  237 mL Oral BID BM  . gabapentin  300 mg Oral TID  . guaiFENesin  1,200 mg Oral BID  . insulin aspart  0-9 Units Subcutaneous TID AC & HS  . insulin glargine  10 Units Subcutaneous q morning - 10a  . ipratropium-albuterol  3 mL Nebulization BID  . lidocaine  15 mL Oral Once  . LORazepam  1 mg  Intravenous Once  . mouth rinse  15 mL Mouth Rinse BID  . pantoprazole  40 mg Oral BID  . senna-docusate  1 tablet Oral BID  . tamsulosin  0.4 mg Oral QHS   Continuous Infusions: . ceFEPime (MAXIPIME) IV 1 g (10/03/18 0905)  . vancomycin 750 mg (10/03/18 0911)     LOS: 30 days    Time spent: 40 minutes    Irine Seal, MD [facility]  If 7PM-7AM, please contact night-coverage [email or web address] 10/03/2018, 2:46 PM

## 2018-10-03 NOTE — Progress Notes (Addendum)
Patient w/low cbg, asymptomatic, POs given per hypoglycemia protocol with little response, does not want to try glucose gel at this time as he just finished eating his dinner, will recheck, will page MD to make aware

## 2018-10-04 ENCOUNTER — Ambulatory Visit
Admit: 2018-10-04 | Discharge: 2018-10-04 | Disposition: A | Discharge status: Home / Self Care | Payer: Self-pay | Attending: Radiation Oncology | Admitting: Radiation Oncology

## 2018-10-04 DIAGNOSIS — A498 Other bacterial infections of unspecified site: Secondary | ICD-10-CM

## 2018-10-04 LAB — CBC WITH DIFFERENTIAL/PLATELET
ABS IMMATURE GRANULOCYTES: 0.13 10*3/uL — AB (ref 0.00–0.07)
Basophils Absolute: 0 10*3/uL (ref 0.0–0.1)
Basophils Relative: 0 %
Eosinophils Absolute: 0 10*3/uL (ref 0.0–0.5)
Eosinophils Relative: 0 %
HCT: 27.6 % — ABNORMAL LOW (ref 39.0–52.0)
Hemoglobin: 8.9 g/dL — ABNORMAL LOW (ref 13.0–17.0)
Immature Granulocytes: 1 %
Lymphocytes Relative: 7 %
Lymphs Abs: 1 10*3/uL (ref 0.7–4.0)
MCH: 28.2 pg (ref 26.0–34.0)
MCHC: 32.2 g/dL (ref 30.0–36.0)
MCV: 87.3 fL (ref 80.0–100.0)
Monocytes Absolute: 0.8 10*3/uL (ref 0.1–1.0)
Monocytes Relative: 6 %
Neutro Abs: 12.2 10*3/uL — ABNORMAL HIGH (ref 1.7–7.7)
Neutrophils Relative %: 86 %
PLATELETS: 304 10*3/uL (ref 150–400)
RBC: 3.16 MIL/uL — ABNORMAL LOW (ref 4.22–5.81)
RDW: 13.4 % (ref 11.5–15.5)
WBC: 14.2 10*3/uL — ABNORMAL HIGH (ref 4.0–10.5)
nRBC: 0 % (ref 0.0–0.2)

## 2018-10-04 LAB — TYPE AND SCREEN
ABO/RH(D): B POS
Antibody Screen: NEGATIVE
UNIT DIVISION: 0
Unit division: 0

## 2018-10-04 LAB — BPAM RBC
BLOOD PRODUCT EXPIRATION DATE: 202002102359
Blood Product Expiration Date: 202002112359
ISSUE DATE / TIME: 202001111730
Unit Type and Rh: 7300
Unit Type and Rh: 7300

## 2018-10-04 LAB — BASIC METABOLIC PANEL
Anion gap: 7 (ref 5–15)
BUN: 9 mg/dL (ref 6–20)
CO2: 29 mmol/L (ref 22–32)
Calcium: 7.1 mg/dL — ABNORMAL LOW (ref 8.9–10.3)
Chloride: 99 mmol/L (ref 98–111)
Creatinine, Ser: 0.48 mg/dL — ABNORMAL LOW (ref 0.61–1.24)
GFR calc Af Amer: >60 mL/min (ref >60)
GFR calc non Af Amer: >60 mL/min (ref >60)
Glucose, Bld: 201 mg/dL — ABNORMAL HIGH (ref 70–99)
POTASSIUM: 3.6 mmol/L (ref 3.5–5.1)
Sodium: 135 mmol/L (ref 135–145)

## 2018-10-04 LAB — GLUCOSE, CAPILLARY
GLUCOSE-CAPILLARY: 188 mg/dL — AB (ref 70–99)
Glucose-Capillary: 102 mg/dL — ABNORMAL HIGH (ref 70–99)
Glucose-Capillary: 158 mg/dL — ABNORMAL HIGH (ref 70–99)
Glucose-Capillary: 207 mg/dL — ABNORMAL HIGH (ref 70–99)
Glucose-Capillary: 244 mg/dL — ABNORMAL HIGH (ref 70–99)

## 2018-10-04 MED ORDER — GABAPENTIN 400 MG PO CAPS
400.0000 mg | ORAL_CAPSULE | Freq: Three times a day (TID) | ORAL | Status: DC
  Administered 2018-10-04 – 2018-11-03 (×106): 400 mg via ORAL
  Filled 2018-10-04 (×110): qty 1

## 2018-10-04 NOTE — Progress Notes (Signed)
Nutrition Follow-up  DOCUMENTATION CODES:   Severe malnutrition in context of chronic illness, Underweight  INTERVENTION:  - Continue Ensure Enlive BID, each supplement provides 350 kcal and 20 grams of protein. - Continue to encourage PO intakes.    NUTRITION DIAGNOSIS:   Severe Malnutrition related to chronic illness(Pancreatic cancer, DM and CHF) as evidenced by severe muscle depletion, severe fat depletion, percent weight loss. -ongoing  GOAL:   Patient will meet greater than or equal to 90% of their needs -met on average  MONITOR:   PO intake, Supplement acceptance, Weight trends, Labs  ASSESSMENT:   58 y.o male with PMH: recent dx pancreatic cancer, DM, CHF, crack cocaine use, emboli of legs, smoker. Pt admitted with admitted with DKA and GI bleed.   No weight since 1/7. Per chart review, patient has accepted 5 of 9 bottles of Ensure Enlive. He ate 100% of breakfast this AM (1065 kcal, 23 grams of protein) and had ordered lunch (1530 kcal, 55 grams of protein). Patient consumed 100% of meals on 1/11 which provided a total of 1700 kcal, 82 grams of protein.   Per Dr. Biagio Borg note 1/12 afternoon: newly dx pancreatic adenocarcinoma with chemo currently on hold, plan to stay in the hospital for entirety of radiation treatment, acute hemorrhagic shock 2/2 acute GIB at time of admission/early in hospitalization,  Ongoing dark stool with GI recommending transfusion for Hgb <7, extensive L popliteal artery embolus s/p embolectomy and fasciotomies of RLE and staples now removed, cocaine use PTA.    Medications reviewed; 5 mg Marinol BID, sliding scale novolog, 40 mg protonix BID, 1 tablet senokot BID. Labs reviewed; CBGs: 244, 158, and 102 mg/dL today, creatinine: 0.48 mg/dL, Ca: 7.1 mg/dL.       Diet Order:   Diet Order            DIET SOFT Room service appropriate? Yes; Fluid consistency: Thin  Diet effective now              EDUCATION NEEDS:   Not appropriate for  education at this time  Skin:  Skin Assessment: Reviewed RN Assessment  Last BM:  1/12  Height:   Ht Readings from Last 1 Encounters:  09/28/18 _0  (1.803 m)    Weight:   Wt Readings from Last 1 Encounters:  09/28/18 57.6 kg    Ideal Body Weight:  78 kg  BMI:  Body mass index is 17.71 kg/m.  Estimated Nutritional Needs:   Kcal:  2000-2200 kcal  Protein:  100-110 grams  Fluid:  >/= 2L/day     Jarome Matin, MS, RD, LDN, CNSC Inpatient Clinical Dietitian Pager # [phone] After hours/weekend pager # [phone]

## 2018-10-04 NOTE — Progress Notes (Signed)
Mr. Edmundson is now up on 4 E.  He is doing okay.  I suspect that he was transfused over the weekend as his hemoglobin went from 6.9 up to 9.2.  He is doing okay with radiation.  He is still having pain in the right leg.  I think he will always have pain in that leg because of the arterial embolism that probably affected his nerves.  I am not sure how this could really be dealt with.  He says he cannot put any weight onto the leg.  I am not sure if physical therapy is seeing him.  I think this would definitely be a very good idea.  Today, his white cell count was 14.2.  Hemoglobin 8.9.  And platelet count 304,000.  His blood sugar is 201.  Creatinine 0.48.  He has grown Pseudomonas in his urine.  He is on Maxipime.  He seems to be eating a little bit better.  I think he is eating regular food.  He is on Marinol.  Hopefully this might help a little bit with the leg.  He also is on Neurontin.  We might want to increase the Neurontin dose.  On his physical exam, his vital signs show temperature 99.9.  Pulse 96.  Blood pressure 124/90.  His head neck exam shows no ocular or oral lesions.  He has no adenopathy in the neck.  Lungs are clear.  Cardiac exam regular rate and rhythm.  Abdomen is soft.  Some slight distention.  There is no obvious fluid wave.  He has had no guarding or rebound tenderness.  There is no abdominal mass.  Extremities shows the healing surgical scars.  The sutures were removed at last week.  He has good skin temperature to touch in his legs.  He will continue his radiation therapy.  I do think that social work is going to have to start working on finding a place for him to live.  Again I sense that he and his girlfriend are homeless.  It would be really nice of their was some facility that they can go to.  I appreciate everybody's help.  [name], MD  [place] [phone]

## 2018-10-04 NOTE — Progress Notes (Signed)
Physical Therapy Treatment Patient Details Name: Ryan Medina MRN: [id number] DOB: 09-19-1961 Today's Date: 10/04/2018    History of Present Illness Patient is a 58 y.o. male with PMH including but not limited to DM and HTN who presented to the hospital on 12/13 with a right ischemic leg secondary to acute popliteal and tibial embolus, underwent embolectomy on 12/13 and subsequently was placed on a heparin drip.  He unfortunately developed a acute right lower leg hematoma with compartment syndrome requiring fasciotomy on 12/15.  Further hospital course was complicated by development of hemorrhagic shock with acute blood loss anemia secondary to a bleeding duodenal ulcer-requiring ICU transfer-intubation for airway protection and IR evaluation with mesenteric angiogram and GDA embolization.  Upon stability he was transferred back to the [facility] hospital[facility] on 12/19. Pt is now s/p closure of R LE fasciotomy on 09/10/18. 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  Plans to transfer to [facility] for radiation treatment.     PT Comments    Patient tolerated  The CAM boot  On the right ankle. Upon standing, patient reports pain escalating with right foot dependent. Appears that dependency is more cause for pain. Continue attempts at gait with CAM boot , increasing Weight on the right leg to improve safety for ambulation.  Follow Up Recommendations  SNF     Equipment Recommendations  Rolling walker with 5" wheels;3in1 (PT)    Recommendations for Other Services       Precautions / Restrictions Precautions Precautions: Fall Precaution Comments: pt is currently hopping on one leg due to R LE pain Required Braces or Orthoses: Other Brace Other Brace: obtained a CAM boot for right  ankle hoping to tolerate  more WB, did not change the pain with dependency of right foot. Restrictions RLE Weight Bearing: Weight bearing as tolerated    Mobility  Bed  Mobility Overal bed mobility: Independent                Transfers   Equipment used: Rolling walker (2 wheeled) Transfers: Sit to/from [employer] transfer comment: placed Cam boot on right foot. Patient tolerated placing well. The  second the right leg gets dependent, patient reports pain escalating. Patient mad attempts to place weight on the right foot in CAM boot but unable. patient immediately got back into bed to elevate right foot.  Patient then stood  at [facility] and hopped to recliner.  Ambulation/Gait                 Stairs             Wheelchair Mobility    Modified Rankin (Stroke Patients Only)       Balance                                            Cognition Arousal/Alertness: Awake/alert Behavior During Therapy: Anxious                                          Exercises      General Comments        Pertinent Vitals/Pain Faces Pain Scale: Hurts whole lot Pain Location: R LE , increases with  right leg dependnent Pain Descriptors / Indicators: Grimacing;Guarding;Sore Pain Intervention(s): Limited activity within patient's tolerance;Monitored during session;Premedicated before session;Repositioned    Home Living                      Prior Function            PT Goals (current goals can now be found in the care plan section) Progress towards PT goals: Progressing toward goals    Frequency    Min 2X/week      PT Plan Current plan remains appropriate    Co-evaluation              AM-PAC PT "6 Clicks" Mobility   Outcome Measure  Help needed turning from your back to your side while in a flat bed without using bedrails?: None Help needed moving from lying on your back to sitting on the side of a flat bed without using bedrails?: None Help needed moving to and from a bed to a chair (including a wheelchair)?: A Little Help needed standing up  from a chair using your arms (e.g., wheelchair or bedside chair)?: A Little Help needed to walk in hospital room?: A Little Help needed climbing 3-5 steps with a railing? : A Lot 6 Click Score: 19    End of Session   Activity Tolerance: Patient limited by pain Patient left: in chair;with call bell/phone within reach;with family/visitor present Nurse Communication: Mobility status PT Visit Diagnosis: Other abnormalities of gait and mobility (R26.89);Pain Pain - Right/Left: Right Pain - part of body: Leg     Time: 1353-1409 PT Time Calculation (min) (ACUTE ONLY): 16 min  Charges:  $Gait Training: 8-22 mins                     [place] Pager [phone] Office [phone]    Claretha Cooper 10/04/2018, 2:26 PM

## 2018-10-04 NOTE — Progress Notes (Signed)
PROGRESS NOTE    Ryan Medina  NWG:[id number] DOB: September 15, 1961 DOA: 09/03/2018 PCP: Patient, No Pcp Per   Brief Narrative:  23 yoM admitted with LLE pain found to have ischemic right leg and acute popliteal and tibial embolus s/p embolectomy 12/13. On heparin gtt. Developed acute RLL hematoma with compartment syndrome requiring fasciotomy 12/15. Decompensated 12/17 am with hypotension with abd distention and 2 massive bloody stools. Hgb drop 9.5 ->5.9. Getting 2 units PRBC, protonix, and GI consulted. Moved to ICU for hemodynamic instability. Had embolization of GDA 09/07/2018. Transferred out of ICU 12/18.  Transferred back to ICU 1/5 overnight for hypotension and recurrent anemia.  Patient transfused a total of 22 units packed red blood cells.  Patient seen by oncology and patient transferred to [facility] for radiation therapy to pancreatic mass.   Assessment & Plan:   Principal Problem:   Popliteal artery embolus (HCC) Active Problems:   HCAP (healthcare-associated pneumonia)   DM (diabetes mellitus), type 2, uncontrolled ([place])   Essential hypertension   Dilated cardiomyopathy (HCC)   Chest pain   AAA (abdominal aortic aneurysm) (HCC)   Ischemic foot   Right leg claudication (HCC)   PVD (peripheral vascular disease) ([place])   Hemorrhagic shock (HCC)   Nontraumatic ischemic infarction of muscle of right lower leg   Upper GI bleed   Cocaine abuse ([place])   Acute blood loss anemia   Chronic pain syndrome   Duodenal ulcer with hemorrhage   Pancreatic cancer (HCC)   Goals of care, counseling/discussion   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  Noted to be nonresectable.  Patient seen in consultation by oncology and patient transferred to [facility] long to start radiation treatment.  Chemotherapy on hold for now per oncology. Per Oncology patient will likely need to stay in-house for entirety of his radiation treatment.  Patient already started with radiation treatments. Palliative care consulted and also following.  Per oncology.  2.  Acute hemorrhagic shock secondary to acute GI bleed Patient noted to be in acute hemorrhagic shock requiring pressors and octreotide which has since been discontinued.  EGD from 09/09/2018 showed bleeding duodenal ulcer likely etiology of patient's bleed with concerns for malignancy from invasive pancreatic cancer despite negative biopsy results.  Patient status post recurrent bleeding despite previous EGD/hemo-spray 09/07/2018.  Patient status post GDA embolization 09/07/2018.  Endoscopic ulcer appearance on 09/16/2018 showed deeply cratered, friable, intermittent oozing blood, firm.  IR holding on repeat angiogram/embolization..  Systolic blood pressures in the low 100s.  Patient currently asymptomatic.  Patient with black/melanotic stools alternating with brown stools ongoing.  Hemoglobin slowly trickling down and currently at 8.9 from 9.2 from 6.9 (10/02/2018) after 2 units of packed red blood cells.  Patient likely slowly losing some blood.  Patient status post 24 units packed red blood cells.  Patient status post GDA embolization per IR 09/07/2018.  Patient s/p IV iron per hematology/oncology.  Octreotide has been discontinued.  Continue PPI twice daily. Patient currently tolerating soft diet.  GI has reviewed chart again and feel patient's hemoglobin drifting down and dark stools not a surprise and expect this to continue.  GI recommending transfusion for hemoglobin less than 7.  Follow.  3.  Rule out sepsis likely secondary to HCAP and pseudomonas aeruginosa  UTI Patient noted to be febrile the night of 10/01/2018, with T-max of 102.4.  Patient also noted to have a leukocytosis of 18.7 on 10/01/2018.  Chest x-ray done on 10/01/2018 consistent with a new patchy left lower lung opacity likely representing pneumonia.  Fever curve trending down.  Sputum Gram stain and cultures pending.  Urine Legionella antigen negative.  Urine pneumococcus antigen negative.  Blood cultures reordered and pending.  Urine cultures with greater than 100,000 pseudomonas aeruginosa.  Continue IV cefepime.  Discontinue IV vancomycin have blood cultures have remained negative x3 days.  MRSA PCR negative.    4.  Acute blood loss anemia Secondary to problem #2.  Status post 24 units packed red blood cells.  Hemoglobin was at 6.9 on 10/02/2018 patient transfused 2 units packed red blood cells hemoglobin currently at 8.9.  Patient likely slowly losing blood from duodenal ulcer. Transfusion threshold hemoglobin less than 7.  Follow H&H.  5.  Extensive left popliteal artery embolus  status post embolectomy and fasciotomies of right lower extremity.  Staples have been removed.  Patient deemed not a candidate for anticoagulation due to GI bleed.  Pain management.  Per vascular surgery.  PT following.  Cam boot ordered for patient per PT.  6.??  Small bowel obstruction Gastric distention noted on CT abdomen with some distention.  Resolved.  Patient with no nausea or vomiting.  Patient states having brown stool.  No need for NG tube at this time.  Diet has been advanced to a soft diet which patient is tolerating.  Follow.    7.  Chronic systolic heart failure/diastolic cardiomyopathy Complicated by cocaine use.  Currently stable.  Supportive care.  8.  Cocaine abuse Patient counseled on cessation.  9.  Diabetes mellitus II Hemoglobin A1c 18.2 on 08/23/2018. CBG of 231 today.  Diet has been advanced to a soft diet.  Patient noted to have a CBG of 52 on 10/03/2018 at 1854 hrs. and as such  patient's Lantus has been discontinued.  Continue sliding scale insulin.  10.  Tachycardia Patient denies any chest pain.  EKG with a sinus tachycardia.  TSH within normal limits at 2.551.  Heart rate improved on current regimen of Coreg.  Follow.    DVT prophylaxis: Recent GI bleed not a anticoagulation candidate at this time. Code Status: Partial Family Communication: Updated patient and fianc at bedside. Disposition Plan: Patient will need to stay in-house for completion of radiation treatment and when okay with oncology.   Significant Hospital Events   12/13 Admitted, right popliteal and tibial embolectomy with patch angioplasty 12/15 OR for evacuation of hematoma and lymphocele right leg, 4 compartment fasciotomy, placement of vac 12/17 GI and IR consult. Taken to IR for GDA embolization 12/19 EGD with hemospray applied to duodenal ulcer 12/20 OR for closure of medial and lateral fasciotomy incisions 12/21 CT A/P with pancreatic mass 12/23 CIR consult 12/26 EUS that showed pancreatic adenocarcinoma 12/30 oncology consult 1/2 radiation therapy feels XRT would not be of much benefit 1/4 IR consulted for port a cath placement 1/5 transferred back to ICU for hypotension and recurrent anemia. Since admit, had 14u PRBC's with additional 4 ordered 1/6 CT pancreas ordered by oncology 10/02/2018-To transfuse 2 units packed red blood cells     Consultants:  Vascular GI PCCM Oncology / radiation onc IR Palliative  Procedures:  TTE  12/14 >EF 45-50%, G1DD. CTA PE 12/16 >No evidence of thoracic aortic aneurysm or dissection.Small bilateral effusions with associated bibasilarconsolidation. CTA A/P 12/21 >new pancreatic mass. Occluded GDA after coil embolization. Stable aneurysmal disease though has significant increase in mural thrombus within the dilated common iliac arteries bilaterally. MRI abd 12/22 > pancreatic neck mass c/w adenocarcinoma EUS 12/26 >pancreatic  adenocarcinoma. Duodenal ulcer not c/w malignancy. TEE 12/26 > EF 50-55%, mild MR, no thrombus, negative bubble study. Renal US 1/2 > negative for hydro or acute process. CT pancreas 1/6 >Distention of gastric lumen to antrum, increase in size of pancreatic neck mass with involvement of superior mesenteric and celiac artery  12/13 LLE embolectomy  12/15 RLE fasciotomy  09/07/2018 intubation>> 09/07/2018 interventional radiology perform mesenteric angiography and cold embolization of the GDA.    Antimicrobials:   IV vancomycin 10/02/2018>>>>> 10/04/2018  IV cefepime 10/02/2018   Subjective: Patient laying in bed.  Denies any chest pain.  Denies any shortness of breath.  Complain of some pain in his right lower extremity.   Patient states having alternating black stools with brown stools.  Objective: Vitals:   10/03/18 2023 10/04/18 0631 10/04/18 0850 10/04/18 1421  BP: 120/79 124/90  120/80  Pulse: (!) 101 96  94  Resp: 18 14  (!) 22  Temp: (!) 101.1 F (38.4 C) 99.9 F (37.7 C)  99.2 F (37.3 C)  TempSrc: Oral Oral  Oral  SpO2: 100% 96% (!) 88% 98%  Weight:      Height:        Intake/Output Summary (Last 24 hours) at 10/04/2018 1511 Last data filed at 10/04/2018 0900 Gross per 24 hour  Intake 1000.11 ml  Output 1050 ml  Net -49.89 ml   Filed Weights   09/16/18 1325 09/28/18 1500 09/28/18 1914  Weight: 66.7 kg 59 kg 57.6 kg    Examination:  General exam: NAD.  Frail.  Cachectic. Respiratory system: Coarse breath sounds left base.  No wheezes, no crackles, no rhonchi.  Normal respiratory effort.  Speaking in full sentences.   Cardiovascular system: RRR no murmurs rubs or gallops.  No JVD.  No lower extremity edema.  Gastrointestinal system: Abdomen is soft, nontender, nondistended, positive bowel sounds.  No rebound.  No guarding.  Central nervous system: Alert and oriented. No focal neurological deficits. Extremities: Symmetric 5 x 5 power. Skin: No rashes,  lesions or ulcers Psychiatry: Judgement and insight appear normal. Mood & affect appropriate.     Data Reviewed: I have personally reviewed following labs and imaging studies  CBC: Recent Labs  Lab 09/30/18 0317  10/01/18 0319 10/01/18 1351 10/02/18 0559 10/03/18 0904 10/04/18 0346  WBC 14.1*  --  18.7*  --  12.2* 12.3* 14.2*  NEUTROABS  --   --   --   --   --  10.8* 12.2*  HGB 8.4*   < > 7.4* 7.5* 6.9* 9.2* 8.9*  HCT 25.6*   < > 23.2* 23.2* 21.8* 28.4* 27.6*  MCV 88.0  --  89.9  --  89.0 87.7 87.3  PLT 193  --  230  --  245 310 304   < > = values in this interval not displayed.   Basic Metabolic Panel: Recent Labs  Lab 09/28/18 0449 09/29/18 0324 09/30/18 0317 10/01/18 0319 10/02/18 0559 10/03/18 0904 10/04/18 0346  NA 135 134* 135 133* 130* 134* 135  K 3.9 3.5 3.9 3.8 3.5 3.7 3.6  CL 107 99 99 98 94* 98 99  CO2 22  27 26 26 27 29 29   GLUCOSE 186* 186* 169* 214* 172* 246* 201*  BUN 44* 25* 14 13 13 11 9   CREATININE 0.56* 0.55* 0.51* 0.53* 0.54* 0.53* 0.48*  CALCIUM 7.4* 7.6* 7.3* 7.1* 6.9* 7.0* 7.1*  MG 1.6* 1.9 1.5* 2.0  --   --   --   PHOS 2.5 2.5  --   --   --   --   --    GFR: Estimated Creatinine Clearance: 83 mL/min (A) (by C-G formula based on SCr of 0.48 mg/dL (L)). Liver Function Tests: No results for input(s): AST, ALT, ALKPHOS, BILITOT, PROT, ALBUMIN in the last 168 hours. No results for input(s): LIPASE, AMYLASE in the last 168 hours. No results for input(s): AMMONIA in the last 168 hours. Coagulation Profile: Recent Labs  Lab 10/02/18 0910  INR 1.23   Cardiac Enzymes: No results for input(s): CKTOTAL, CKMB, CKMBINDEX, TROPONINI in the last 168 hours. BNP (last 3 results) No results for input(s): PROBNP in the last 8760 hours. HbA1C: No results for input(s): HGBA1C in the last 72 hours. CBG: Recent Labs  Lab 10/03/18 1854 10/03/18 2021 10/04/18 0006 10/04/18 0746 10/04/18 1125  GLUCAP 52* 139* 244* 158* 102*   Lipid Profile: No  results for input(s): CHOL, HDL, LDLCALC, TRIG, CHOLHDL, LDLDIRECT in the last 72 hours. Thyroid Function Tests: No results for input(s): TSH, T4TOTAL, FREET4, T3FREE, THYROIDAB in the last 72 hours. Anemia Panel: No results for input(s): VITAMINB12, FOLATE, FERRITIN, TIBC, IRON, RETICCTPCT in the last 72 hours. Sepsis Labs: Recent Labs  Lab 10/02/18 0910 10/02/18 1225  PROCALCITON 0.15  --   LATICACIDVEN 0.8 0.8    Recent Results (from the past 240 hour(s))  Culture, blood (Routine X 2) w Reflex to ID Panel     Status: None   Collection Time: 09/26/18  7:35 PM  Result Value Ref Range Status   Specimen Description BLOOD RIGHT ANTECUBITAL  Final   Special Requests IN PEDIATRIC BOTTLE Blood Culture adequate volume  Final   Culture   Final    NO GROWTH 5 DAYS Performed at [facility], [place] [address], [place], [place] [address]    Report Status 10/01/2018 FINAL  Final  Culture, blood (Routine X 2) w Reflex to ID Panel     Status: None (Preliminary result)   Collection Time: 10/01/18  9:29 AM  Result Value Ref Range Status   Specimen Description   Final    BLOOD RIGHT HAND Performed at [facility] [address] [place], [place] [address]    Special Requests   Final    BOTTLES DRAWN AEROBIC ONLY Blood Culture results may not be optimal due to an inadequate volume of blood received in culture bottles Performed at [facility] [address], [place], [place] [address]    Culture   Final    NO GROWTH 3 DAYS Performed at [facility], [place] [address] [place], [place] [address]    Report Status PENDING  Incomplete  Culture, blood (Routine X 2) w Reflex to ID Panel     Status: None (Preliminary result)   Collection Time: 10/01/18  9:29 AM  Result Value Ref Range Status   Specimen Description BLOOD RIGHT ARM  Final   Special Requests   Final    BOTTLES DRAWN AEROBIC AND ANAEROBIC Blood Culture adequate volume Performed at  [facility] [address], [place], [place] [address]    Culture  Final    NO GROWTH 3 DAYS Performed at [facility], [place] [address], [place],  [address]    Report Status PENDING  Incomplete  Culture, Urine     Status: Abnormal   Collection Time: 10/01/18  2:12 PM  Result Value Ref Range Status   Specimen Description   Final    URINE, RANDOM Performed at [facility] [address] [place],  [address]    Special Requests   Final    NONE Performed at [facility], [place] [address] [place], [place] [address]    Culture >=100,000 COLONIES/mL PSEUDOMONAS AERUGINOSA (A)  Final   Report Status 10/03/2018 FINAL  Final   Organism ID, Bacteria PSEUDOMONAS AERUGINOSA (A)  Final      Susceptibility   Pseudomonas aeruginosa - MIC*    CEFTAZIDIME 4 SENSITIVE Sensitive     CIPROFLOXACIN <=0.25 SENSITIVE Sensitive     GENTAMICIN <=1 SENSITIVE Sensitive     IMIPENEM 1 SENSITIVE Sensitive     PIP/TAZO <=4 SENSITIVE Sensitive     CEFEPIME <=1 SENSITIVE Sensitive     * >=100,000 COLONIES/mL PSEUDOMONAS AERUGINOSA         Radiology Studies: No results found.      Scheduled Meds: . sodium chloride   Intravenous Once  . sodium chloride   Intravenous Once  . acetaminophen  500 mg Oral Once  . atorvastatin  20 mg Oral QHS  . carvedilol  3.125 mg Oral BID WC  . dextrose  1 Tube Oral Once  . dronabinol  5 mg Oral BID AC  . feeding supplement (ENSURE ENLIVE)  237 mL Oral BID BM  . gabapentin  400 mg Oral TID PC & HS  . guaiFENesin  1,200 mg Oral BID  . insulin aspart  0-9 Units Subcutaneous TID AC & HS  . ipratropium-albuterol  3 mL Nebulization BID  . lidocaine  15 mL Oral Once  . LORazepam  1 mg Intravenous Once  . mouth rinse  15 mL Mouth Rinse BID  . pantoprazole  40 mg Oral BID  . senna-docusate  1 tablet Oral BID  . tamsulosin  0.4 mg Oral QHS   Continuous Infusions: . ceFEPime (MAXIPIME) IV  1 g (10/04/18 0737)     LOS: 31 days    Time spent: 40 minutes    Irine Seal, MD [facility]  If 7PM-7AM, please contact night-coverage [email or web address] 10/04/2018, 3:11 PM

## 2018-10-04 NOTE — Progress Notes (Signed)
[facility] Therapy Treatment Record Phone [phone]   Radiation Therapy was administered to [facility] on: 10/04/2018  5:02 PM and was treatment # 3 out of a planned course of 10 treatments.  Radiation Treatment  1). Beam photons with 6-10 energy  2). Brachytherapy None  3). Stereotactic Radiosurgery None  4). Other Radiation None     Byrd Hesselbach, RT (T)

## 2018-10-04 NOTE — Progress Notes (Signed)
Physical Therapy Treatment Patient Details Name: Ryan Medina MRN: [id number] DOB: 05/17/1961 Today's Date: 10/04/2018    History of Present Illness Patient is a 58 y.o. male with PMH including but not limited to DM and HTN who presented to the hospital on 12/13 with a right ischemic leg secondary to acute popliteal and tibial embolus, underwent embolectomy on 12/13 and subsequently was placed on a heparin drip.  He unfortunately developed a acute right lower leg hematoma with compartment syndrome requiring fasciotomy on 12/15.  Further hospital course was complicated by development of hemorrhagic shock with acute blood loss anemia secondary to a bleeding duodenal ulcer-requiring ICU transfer-intubation for airway protection and IR evaluation with mesenteric angiogram and GDA embolization.  Upon stability he was transferred back to the [facility] hospitalist service on 12/19. Pt is now s/p closure of R LE fasciotomy on 09/10/18. 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  Plans to transfer to [facility] for radiation treatment.     PT Comments    Assessed patient's right leg for facilitating increased weight on the right foot. Patient does demonstrate  Decreased   Active anterior  tibialis  strength, ankle tends to invert Which increases risk for ankle  Injury when attempting WB. Recommend use of a CAM walker boot to support the ankle and provide weight bearing surface. Doubt patient could tolerate an AFO fitted inside of a shoe  due to 8/10 pain constantly per patient. Provided and instructed patient in use of gait belt to assist with AA/PROM to right ankle, belt more effective than a bed sheet and patient will take it home.  PT  will return after CAM boot is  Delivered from [facility] later  Today.  Follow Up Recommendations  SNF     Equipment Recommendations  Rolling walker with 5" wheels;3in1 (PT)    Recommendations for Other Services       Precautions /  Restrictions Precautions Precautions: Fall Precaution Comments: pt is currently hopping on one leg due to R LE pain Restrictions RLE Weight Bearing: Weight bearing as tolerated    Mobility  Bed Mobility               General bed mobility comments: able to self perform as pt tends to use B UE to assist R LE as to "guard" due to pain   Transfers                 General transfer comment: defer until gets a CAM boot  Ambulation/Gait                 Stairs             Wheelchair Mobility    Modified Rankin (Stroke Patients Only)       Balance                                            Cognition Arousal/Alertness: Awake/alert                                            Exercises Other Exercises Other Exercises: Stretch right LE with safety belt  for calf and hamstring.    General Comments        Pertinent Vitals/Pain  Pain Score: 8  Pain Location: R LE  Pain Descriptors / Indicators: Grimacing;Guarding;Sore Pain Intervention(s): Premedicated before session;Monitored during session    Home Living                      Prior Function            PT Goals (current goals can now be found in the care plan section) Progress towards PT goals: Progressing toward goals    Frequency    Min 2X/week      PT Plan Current plan remains appropriate    Co-evaluation              AM-PAC PT "6 Clicks" Mobility   Outcome Measure  Help needed turning from your back to your side while in a flat bed without using bedrails?: None Help needed moving from lying on your back to sitting on the side of a flat bed without using bedrails?: None Help needed moving to and from a bed to a chair (including a wheelchair)?: A Little Help needed standing up from a chair using your arms (e.g., wheelchair or bedside chair)?: A Little Help needed to walk in hospital room?: A Little Help needed climbing 3-5 steps  with a railing? : A Lot 6 Click Score: 19    End of Session   Activity Tolerance: Patient limited by pain Patient left: in bed;with call bell/phone within reach Nurse Communication: Mobility status PT Visit Diagnosis: Other abnormalities of gait and mobility (R26.89);Pain Pain - Right/Left: Right Pain - part of body: Leg     Time: 8329-1916 PT Time Calculation (min) (ACUTE ONLY): 10 min  Charges:  $Therapeutic Exercise: 8-22 mins                     Tresa Endo PT Acute Rehabilitation Services Pager [phone] Office [phone]    Claretha Cooper 10/04/2018, 9:49 AM

## 2018-10-05 ENCOUNTER — Ambulatory Visit
Admit: 2018-10-05 | Discharge: 2018-10-05 | Disposition: A | Discharge status: Home / Self Care | Payer: Self-pay | Attending: Radiation Oncology | Admitting: Radiation Oncology

## 2018-10-05 DIAGNOSIS — Z5900 Homelessness unspecified: Secondary | ICD-10-CM

## 2018-10-05 LAB — CBC WITH DIFFERENTIAL/PLATELET
Abs Immature Granulocytes: 0.15 10*3/uL — ABNORMAL HIGH (ref 0.00–0.07)
Basophils Absolute: 0 10*3/uL (ref 0.0–0.1)
Basophils Relative: 0 %
Eosinophils Absolute: 0 10*3/uL (ref 0.0–0.5)
Eosinophils Relative: 0 %
HCT: 26.3 % — ABNORMAL LOW (ref 39.0–52.0)
Hemoglobin: 8.4 g/dL — ABNORMAL LOW (ref 13.0–17.0)
IMMATURE GRANULOCYTES: 1 %
Lymphocytes Relative: 7 %
Lymphs Abs: 1 10*3/uL (ref 0.7–4.0)
MCH: 28.7 pg (ref 26.0–34.0)
MCHC: 31.9 g/dL (ref 30.0–36.0)
MCV: 89.8 fL (ref 80.0–100.0)
MONOS PCT: 6 %
Monocytes Absolute: 0.8 10*3/uL (ref 0.1–1.0)
NEUTROS ABS: 11.7 10*3/uL — AB (ref 1.7–7.7)
Neutrophils Relative %: 86 %
Platelets: 291 10*3/uL (ref 150–400)
RBC: 2.93 MIL/uL — ABNORMAL LOW (ref 4.22–5.81)
RDW: 13.7 % (ref 11.5–15.5)
WBC: 13.7 10*3/uL — ABNORMAL HIGH (ref 4.0–10.5)
nRBC: 0 % (ref 0.0–0.2)

## 2018-10-05 LAB — BASIC METABOLIC PANEL
ANION GAP: 8 (ref 5–15)
BUN: 7 mg/dL (ref 6–20)
CO2: 26 mmol/L (ref 22–32)
Calcium: 6.7 mg/dL — ABNORMAL LOW (ref 8.9–10.3)
Chloride: 100 mmol/L (ref 98–111)
Creatinine, Ser: 0.56 mg/dL — ABNORMAL LOW (ref 0.61–1.24)
GFR calc Af Amer: >60 mL/min (ref >60)
GFR calc non Af Amer: >60 mL/min (ref >60)
Glucose, Bld: 200 mg/dL — ABNORMAL HIGH (ref 70–99)
POTASSIUM: 3.4 mmol/L — AB (ref 3.5–5.1)
Sodium: 134 mmol/L — ABNORMAL LOW (ref 135–145)

## 2018-10-05 LAB — GLUCOSE, CAPILLARY
GLUCOSE-CAPILLARY: 180 mg/dL — AB (ref 70–99)
Glucose-Capillary: 222 mg/dL — ABNORMAL HIGH (ref 70–99)
Glucose-Capillary: 187 mg/dL — ABNORMAL HIGH (ref 70–99)
Glucose-Capillary: 100 mg/dL — ABNORMAL HIGH (ref 70–99)

## 2018-10-05 MED ORDER — POTASSIUM CHLORIDE CRYS ER 20 MEQ PO TBCR
40.0000 meq | EXTENDED_RELEASE_TABLET | Freq: Once | ORAL | Status: AC
  Administered 2018-10-05: 40 meq via ORAL
  Filled 2018-10-05: qty 2

## 2018-10-05 MED ORDER — ACETAMINOPHEN 325 MG PO TABS
325.0000 mg | ORAL_TABLET | Freq: Once | ORAL | Status: DC
  Filled 2018-10-05: qty 1

## 2018-10-05 MED ORDER — SODIUM CHLORIDE 0.9 % IV BOLUS
500.0000 mL | Freq: Once | INTRAVENOUS | Status: AC
  Administered 2018-10-05: 500 mL via INTRAVENOUS

## 2018-10-05 NOTE — Progress Notes (Signed)
OT Cancellation Note  Patient Details Name: Ryan Medina MRN: [id number] DOB: [birth year]   Cancelled Treatment:    Reason Eval/Treat Not Completed: Other (comment); pt declined OT tx session, reports hasn't slept much and requesting to rest. Agreeable to OT checking back as able. Will follow up as schedule permits.  Lou Cal, OT Supplemental Rehabilitation Services Pager [phone] Office [phone]   Raymondo Band 10/05/2018, 8:53 AM

## 2018-10-05 NOTE — Progress Notes (Signed)
Patient ID: Ryan Medina, male   DOB: [birth year], 58 y.o.   MRN: [id number]  This NP visited patient at the bedside as a follow up for palliative medicine needs and emotional support.  Patient significant other is at the bedside, Ryan Medina is alert and oriented.  He tells me how difficult it is to "get some decent sleep around here" secondary to all the disruptions of being a patient in the hospital.  He verbalizes his gratitude for the care that he is getting so he can "get well ".  Patient reports that his pain is controlled with current medication regime  Patient verbalizes an understanding of the seriousness of his disease but remains hopeful for improvement Patient is open to all offered and available medical interventions to prolong life.    His main concern now is transition of care from the hospital; he and his significant other are homeless.   He tells me there was some discussion of him discharging to a shelter, however this is not an optimal discharge knowing that people staying in the shelters must leave for all daytime hours.  It would be hard pressed at this point in time for Ryan Medina to be able to stay on the streets for daytime hours secondary to his significant disease.  Placed order for social work intervention  Discussed with patient the importance of continued conversation with his  family and his  medical providers regarding overall plan of care and treatment options,  ensuring decisions are within the context of the patients values and GOCs.  Questions and concerns addressed  Discussed with Dr. Grandville Silos Palliative medicine team will continue to support holistically.  Total time spent on the unit was 35  minutes  Greater than 50% of the time was spent in counseling and coordination of care  Wadie Lessen NP  Palliative Medicine Team Team Phone # [phone] Pager [phone]

## 2018-10-05 NOTE — Progress Notes (Signed)
PROGRESS NOTE    Ryan Medina  WUJ:[id number] DOB: 05-22-1961 DOA: 09/03/2018 PCP: Patient, No Pcp Per   Brief Narrative:  3 yoM admitted with LLE pain found to have ischemic right leg and acute popliteal and tibial embolus s/p embolectomy 12/13. On heparin gtt. Developed acute RLL hematoma with compartment syndrome requiring fasciotomy 12/15. Decompensated 12/17 am with hypotension with abd distention and 2 massive bloody stools. Hgb drop 9.5 ->5.9. Getting 2 units PRBC, protonix, and GI consulted. Moved to ICU for hemodynamic instability. Had embolization of GDA 09/07/2018. Transferred out of ICU 12/18.  Transferred back to ICU 1/5 overnight for hypotension and recurrent anemia.  Patient transfused a total of 22 units packed red blood cells.  Patient seen by oncology and patient transferred to [facility] for radiation therapy to pancreatic mass.   Assessment & Plan:   Principal Problem:   Popliteal artery embolus (HCC) Active Problems:   HCAP (healthcare-associated pneumonia)   DM (diabetes mellitus), type 2, uncontrolled ([place])   Essential hypertension   Dilated cardiomyopathy (HCC)   Chest pain   AAA (abdominal aortic aneurysm) (HCC)   Ischemic foot   Right leg claudication (HCC)   PVD (peripheral vascular disease) ([place])   Hemorrhagic shock (HCC)   Nontraumatic ischemic infarction of muscle of right lower leg   Upper GI bleed   Cocaine abuse ([place])   Acute blood loss anemia   Chronic pain syndrome   Duodenal ulcer with hemorrhage   Pancreatic cancer (HCC)   Goals of care, counseling/discussion   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  Noted to be nonresectable.  Patient seen in consultation by oncology and patient transferred to [facility] to start radiation treatment.  Chemotherapy on hold for now per oncology. Per Oncology patient will likely need to stay in-house for entirety of his radiation treatment.  Patient already started with radiation treatments. Palliative care consulted and also following.  Per oncology.  2.  Acute hemorrhagic shock secondary to acute GI bleed Patient noted to be in acute hemorrhagic shock requiring pressors and octreotide which has since been discontinued.  EGD from 09/09/2018 showed bleeding duodenal ulcer likely etiology of patient's bleed with concerns for malignancy from invasive pancreatic cancer despite negative biopsy results.  Patient status post recurrent bleeding despite previous EGD/hemo-spray 09/07/2018.  Patient status post GDA embolization 09/07/2018.  Endoscopic ulcer appearance on 09/16/2018 showed deeply cratered, friable, intermittent oozing blood, firm.  IR holding on repeat angiogram/embolization..  Systolic blood pressures in the low 100s.  Patient currently asymptomatic.  Patient with black/melanotic stools alternating with brown stools ongoing.  Hemoglobin slowly trickling down and currently at 8.4 from 8.9 from 9.2 from 6.9 (10/02/2018) after 2 units of packed red blood cells.  Patient likely slowly losing some blood.  Patient status post 24 units packed red blood cells.  Patient status post GDA embolization per IR 09/07/2018.  Patient s/p IV iron per hematology/oncology.  Octreotide has been discontinued.  Continue PPI twice daily. Patient currently tolerating soft diet.  GI has reviewed chart again and feel patient's hemoglobin drifting down and dark stools not a surprise and expect this  to continue.  GI recommending transfusion for hemoglobin less than 7.  Follow.  3.  Rule out sepsis likely secondary to HCAP and pseudomonas  aeruginosa UTI Patient noted to be febrile the night of 10/01/2018, with T-max of 102.4.  Patient also noted to have a leukocytosis of 18.7 on 10/01/2018.  Chest x-ray done on 10/01/2018 consistent with a new patchy left lower lung opacity likely representing pneumonia.  Fever curve trending down.  Sputum Gram stain and cultures pending with no growth to date.  Urine Legionella antigen negative.  Urine pneumococcus antigen negative.  Blood cultures reordered and pending.  Urine cultures with greater than 100,000 pseudomonas aeruginosa.  Continue IV cefepime.  Discontinued IV vancomycin, as blood cultures remaining negative with no growth to date.  MRSA PCR.  Supportive care.  Follow.   4.  Acute blood loss anemia Secondary to problem #2.  Status post 24 units packed red blood cells.  Hemoglobin was at 6.9 on 10/02/2018 patient transfused 2 units packed red blood cells hemoglobin currently at 8.4.  Patient likely slowly losing blood from duodenal ulcer. Transfusion threshold hemoglobin less than 7.  Follow H&H.  5.  Extensive left popliteal artery embolus  status post embolectomy and fasciotomies of right lower extremity.  Staples have been removed.  Patient deemed not a candidate for anticoagulation due to GI bleed.  Pain management.  Per vascular surgery.  PT following.  Cam boot ordered for patient per PT.  6.??  Small bowel obstruction Gastric distention noted on CT abdomen with some distention.  Resolved.  Patient with no nausea or vomiting.  Patient states having stool.  No need for NG tube at this time.  Patient tolerating current soft diet.  Follow.   7.  Chronic systolic heart failure/diastolic cardiomyopathy Complicated by cocaine use.  Currently stable.  Supportive care.  8.  Cocaine abuse Patient counseled on polysubstance cessation.  9.  Diabetes mellitus II Hemoglobin A1c 18.2 on 08/23/2018. CBG of 222 today.  Diet has been advanced to a soft diet.  Patient noted to have a CBG of 52 on  10/03/2018 at 1854 hrs. and as such patient's Lantus has been discontinued.  Continue sliding scale insulin.  10.  Tachycardia Patient denies any chest pain.  EKG with a sinus tachycardia.  TSH within normal limits at 2.551.  Heart rate improved on current regimen of Coreg.  Follow.    DVT prophylaxis: Recent GI bleed not a anticoagulation candidate at this time. Code Status: Partial Family Communication: Updated patient and fianc at bedside. Disposition Plan: Patient will need to stay in-house for completion of radiation treatment and when okay with oncology.   Significant Hospital Events   12/13 Admitted, right popliteal and tibial embolectomy with patch angioplasty 12/15 OR for evacuation of hematoma and lymphocele right leg, 4 compartment fasciotomy, placement of vac 12/17 GI and IR consult. Taken to IR for GDA embolization 12/19 EGD with hemospray applied to duodenal ulcer 12/20 OR for closure of medial and lateral fasciotomy incisions 12/21 CT A/P with pancreatic mass 12/23 CIR consult 12/26 EUS that showed pancreatic adenocarcinoma 12/30 oncology consult 1/2 radiation therapy feels XRT would not be of much benefit 1/4 IR consulted for port a cath placement 1/5 transferred back to ICU for hypotension and recurrent anemia. Since admit, had 14u PRBC's with additional 4 ordered 1/6 CT pancreas ordered by oncology 10/02/2018-To transfuse 2 units packed red blood cells     Consultants:  Vascular GI PCCM Oncology / radiation onc IR  Palliative  Procedures:  TTE 12/14 >EF 45-50%, G1DD. CTA PE 12/16 >No evidence of thoracic aortic aneurysm or dissection.Small bilateral effusions with associated bibasilarconsolidation. CTA A/P 12/21 >new pancreatic mass. Occluded GDA after coil embolization. Stable aneurysmal disease though has significant increase in mural thrombus within the dilated common iliac arteries bilaterally. MRI abd 12/22 > pancreatic neck mass c/w  adenocarcinoma EUS 12/26 >pancreatic adenocarcinoma. Duodenal ulcer not c/w malignancy. TEE 12/26 > EF 50-55%, mild MR, no thrombus, negative bubble study. Renal US 1/2 > negative for hydro or acute process. CT pancreas 1/6 >Distention of gastric lumen to antrum, increase in size of pancreatic neck mass with involvement of superior mesenteric and celiac artery  12/13 LLE embolectomy  12/15 RLE fasciotomy  09/07/2018 intubation>> 09/07/2018 interventional radiology perform mesenteric angiography and cold embolization of the GDA.    Antimicrobials:   IV vancomycin 10/02/2018>>>>> 10/04/2018  IV cefepime 10/02/2018   Subjective: Patient in bed.  Denies chest pain or shortness of breath.  States had a brown stool this morning.  T-max of 102.1 at 0050 hours this morning.  Objective: Vitals:   10/04/18 1959 10/04/18 2330 10/05/18 0050 10/05/18 0404  BP:    95/63  Pulse:    97  Resp:    16  Temp:  (!) 101.2 F (38.4 C) (!) 102.1 F (38.9 C) 98.5 F (36.9 C)  TempSrc:  Oral Oral Oral  SpO2: 95%   100%  Weight:      Height:        Intake/Output Summary (Last 24 hours) at 10/05/2018 0957 Last data filed at 10/05/2018 0981 Gross per 24 hour  Intake 120 ml  Output 650 ml  Net -530 ml   Filed Weights   09/16/18 1325 09/28/18 1500 09/28/18 1914  Weight: 66.7 kg 59 kg 57.6 kg    Examination:  General exam: NAD.  Frail.  Cachectic. Respiratory system: Decreasing coarse breath sounds in the left base.  No wheezes, no crackles, no rhonchi.  Normal respiratory effort.  Speaking in full sentences.  Cardiovascular system: Regular rate rhythm no murmurs rubs or gallops.  No JVD.  No lower extremity edema. Gastrointestinal system: Abdomen is nontender, nondistended, soft, positive bowel sounds.  No rebound.  No guarding.  Central nervous system: Alert and oriented. No focal neurological deficits. Extremities: Symmetric 5 x 5 power. Skin: No rashes, lesions or ulcers Psychiatry:  Judgement and insight appear normal. Mood & affect appropriate.     Data Reviewed: I have personally reviewed following labs and imaging studies  CBC: Recent Labs  Lab 10/01/18 0319 10/01/18 1351 10/02/18 0559 10/03/18 0904 10/04/18 0346 10/05/18 0343  WBC 18.7*  --  12.2* 12.3* 14.2* 13.7*  NEUTROABS  --   --   --  10.8* 12.2* 11.7*  HGB 7.4* 7.5* 6.9* 9.2* 8.9* 8.4*  HCT 23.2* 23.2* 21.8* 28.4* 27.6* 26.3*  MCV 89.9  --  89.0 87.7 87.3 89.8  PLT 230  --  245 310 304 191   Basic Metabolic Panel: Recent Labs  Lab 09/29/18 0324 09/30/18 0317 10/01/18 0319 10/02/18 0559 10/03/18 0904 10/04/18 0346 10/05/18 0343  NA 134* 135 133* 130* 134* 135 134*  K 3.5 3.9 3.8 3.5 3.7 3.6 3.4*  CL 99 99 98 94* 98 99 100  CO2 27 26 26 27 29 29 26   GLUCOSE 186* 169* 214* 172* 246* 201* 200*  BUN 25* 14 13 13 11 9 7   CREATININE 0.55* 0.51* 0.53* 0.54* 0.53* 0.48* 0.56*  CALCIUM 7.6*  7.3* 7.1* 6.9* 7.0* 7.1* 6.7*  MG 1.9 1.5* 2.0  --   --   --   --   PHOS 2.5  --   --   --   --   --   --    GFR: Estimated Creatinine Clearance: 83 mL/min (A) (by C-G formula based on SCr of 0.56 mg/dL (L)). Liver Function Tests: No results for input(s): AST, ALT, ALKPHOS, BILITOT, PROT, ALBUMIN in the last 168 hours. No results for input(s): LIPASE, AMYLASE in the last 168 hours. No results for input(s): AMMONIA in the last 168 hours. Coagulation Profile: Recent Labs  Lab 10/02/18 0910  INR 1.23   Cardiac Enzymes: No results for input(s): CKTOTAL, CKMB, CKMBINDEX, TROPONINI in the last 168 hours. BNP (last 3 results) No results for input(s): PROBNP in the last 8760 hours. HbA1C: No results for input(s): HGBA1C in the last 72 hours. CBG: Recent Labs  Lab 10/04/18 0746 10/04/18 1125 10/04/18 1724 10/04/18 2101 10/05/18 0732  GLUCAP 158* 102* 188* 207* 222*   Lipid Profile: No results for input(s): CHOL, HDL, LDLCALC, TRIG, CHOLHDL, LDLDIRECT in the last 72 hours. Thyroid Function  Tests: No results for input(s): TSH, T4TOTAL, FREET4, T3FREE, THYROIDAB in the last 72 hours. Anemia Panel: No results for input(s): VITAMINB12, FOLATE, FERRITIN, TIBC, IRON, RETICCTPCT in the last 72 hours. Sepsis Labs: Recent Labs  Lab 10/02/18 0910 10/02/18 1225  PROCALCITON 0.15  --   LATICACIDVEN 0.8 0.8    Recent Results (from the past 240 hour(s))  Culture, blood (Routine X 2) w Reflex to ID Panel     Status: None   Collection Time: 09/26/18  7:35 PM  Result Value Ref Range Status   Specimen Description BLOOD RIGHT ANTECUBITAL  Final   Special Requests IN PEDIATRIC BOTTLE Blood Culture adequate volume  Final   Culture   Final    NO GROWTH 5 DAYS Performed at [facility], [place] [address], [place], [place] [address]    Report Status 10/01/2018 FINAL  Final  Culture, blood (Routine X 2) w Reflex to ID Panel     Status: None (Preliminary result)   Collection Time: 10/01/18  9:29 AM  Result Value Ref Range Status   Specimen Description   Final    BLOOD RIGHT HAND Performed at [place] [address] [place], [place] [address]    Special Requests   Final    BOTTLES DRAWN AEROBIC ONLY Blood Culture results may not be optimal due to an inadequate volume of blood received in culture bottles Performed at [facility] [address], [place], [place] [address]    Culture   Final    NO GROWTH 3 DAYS Performed at [facility], [place] [address], [place], [place] [address]    Report Status PENDING  Incomplete  Culture, blood (Routine X 2) w Reflex to ID Panel     Status: None (Preliminary result)   Collection Time: 10/01/18  9:29 AM  Result Value Ref Range Status   Specimen Description BLOOD RIGHT ARM  Final   Special Requests   Final    BOTTLES DRAWN AEROBIC AND ANAEROBIC Blood Culture adequate volume Performed at [place] [address], [place], [place] [address]    Culture   Final    NO GROWTH  3 DAYS Performed at [facility], [place] [address] [place],  [address]    Report Status PENDING  Incomplete  Culture, Urine     Status: Abnormal   Collection Time: 10/01/18  2:12 PM  Result Value Ref Range Status   Specimen Description   Final    URINE, RANDOM Performed at [place] [facility] [address], [place], [place] [address]    Special Requests   Final    NONE Performed at [facility], [place] [address] [place], [place] [address]    Culture >=100,000 COLONIES/mL PSEUDOMONAS AERUGINOSA (A)  Final   Report Status 10/03/2018 FINAL  Final   Organism ID, Bacteria PSEUDOMONAS AERUGINOSA (A)  Final      Susceptibility   Pseudomonas aeruginosa - MIC*    CEFTAZIDIME 4 SENSITIVE Sensitive     CIPROFLOXACIN <=0.25 SENSITIVE Sensitive     GENTAMICIN <=1 SENSITIVE Sensitive     IMIPENEM 1 SENSITIVE Sensitive     PIP/TAZO <=4 SENSITIVE Sensitive     CEFEPIME <=1 SENSITIVE Sensitive     * >=100,000 COLONIES/mL PSEUDOMONAS AERUGINOSA         Radiology Studies: No results found.      Scheduled Meds: . sodium chloride   Intravenous Once  . sodium chloride   Intravenous Once  . acetaminophen  325 mg Oral Once  . acetaminophen  500 mg Oral Once  . atorvastatin  20 mg Oral QHS  . carvedilol  3.125 mg Oral BID WC  . dronabinol  5 mg Oral BID AC  . feeding supplement (ENSURE ENLIVE)  237 mL Oral BID BM  . gabapentin  400 mg Oral TID PC & HS  . guaiFENesin  1,200 mg Oral BID  . insulin aspart  0-9 Units Subcutaneous TID AC & HS  . lidocaine  15 mL Oral Once  . LORazepam  1 mg Intravenous Once  . mouth rinse  15 mL Mouth Rinse BID  . pantoprazole  40 mg Oral BID  . potassium chloride  40 mEq Oral Once  . senna-docusate  1 tablet Oral BID  . tamsulosin  0.4 mg Oral QHS   Continuous Infusions: . ceFEPime (MAXIPIME) IV 1 g (10/05/18 0916)     LOS: 32 days    Time spent: 40 minutes    Irine Seal, MD [facility]  If 7PM-7AM, please contact night-coverage [email or web address] 10/05/2018, 9:57 AM

## 2018-10-05 NOTE — Progress Notes (Signed)
Pharmacy Antibiotic Note  Ryan Medina is a 58 y.o. male admitted to [facility] on 09/03/2018 with ischemic right leg s/p embolectomy 12/13 and on heparin drip. Patient developed acute RLL hematoma with compartment syndrome requiring fasciotomy 12/15 with subsequent decompensation 12/17 moved to ICU for hemodynamic instability. Transferred out of ICU 12/18 and back on 1/5 for hypotension and recurrent anemia. Seen by oncology and transferred to [facility] for radiation therapy to pancreatic mass. Pharmacy has been consulted for vancomycin and cefepime dosing empirically for PNA.  Plan: Continue Cefepime 1g IV q8h No further dose adjustments needed, Pharmacy will sign off  Height: 5\' 11"  (180.3 cm) Weight: 126 lb 15.8 oz (57.6 kg) IBW/kg (Calculated) : 75.3  Temp (24hrs), Avg:100.2 F (37.9 C), Min:98.5 F (36.9 C), Max:102.1 F (38.9 C)  Recent Labs  Lab 10/01/18 0319 10/02/18 0559 10/02/18 0910 10/02/18 1225 10/03/18 0904 10/04/18 0346 10/05/18 0343  WBC 18.7* 12.2*  --   --  12.3* 14.2* 13.7*  CREATININE 0.53* 0.54*  --   --  0.53* 0.48* 0.56*  LATICACIDVEN  --   --  0.8 0.8  --   --   --     Estimated Creatinine Clearance: 83 mL/min (A) (by C-G formula based on SCr of 0.56 mg/dL (L)).    Allergies  Allergen Reactions  . Lisinopril Anaphylaxis    Antimicrobials this admission: 1/11 vancomycin>>1/13 1/11 cefepime>>  Dose adjustments this admission:  Microbiology results: MC: 12/20 MRSA PCR: negative 1/5 BCx: NGF  WL: 1/10BCx: NGTD 1/10UCx: PsA - pansensitive Sputum: sent Thank you for allowing pharmacy to be a part of this patient's care.  Peggyann Juba, PharmD, BCPS Pager: [phone] 10/05/2018 11:13 AM

## 2018-10-06 ENCOUNTER — Ambulatory Visit
Admit: 2018-10-06 | Discharge: 2018-10-06 | Disposition: A | Discharge status: Home / Self Care | Payer: Self-pay | Attending: Radiation Oncology | Admitting: Radiation Oncology

## 2018-10-06 LAB — BASIC METABOLIC PANEL
ANION GAP: 9 (ref 5–15)
BUN: 8 mg/dL (ref 6–20)
CO2: 28 mmol/L (ref 22–32)
Calcium: 7 mg/dL — ABNORMAL LOW (ref 8.9–10.3)
Chloride: 97 mmol/L — ABNORMAL LOW (ref 98–111)
Creatinine, Ser: 0.46 mg/dL — ABNORMAL LOW (ref 0.61–1.24)
GFR calc Af Amer: >60 mL/min (ref >60)
GFR calc non Af Amer: >60 mL/min (ref >60)
Glucose, Bld: 183 mg/dL — ABNORMAL HIGH (ref 70–99)
Potassium: 3.8 mmol/L (ref 3.5–5.1)
Sodium: 134 mmol/L — ABNORMAL LOW (ref 135–145)

## 2018-10-06 LAB — CULTURE, BLOOD (ROUTINE X 2)
Culture: NO GROWTH
Culture: NO GROWTH
Special Requests: ADEQUATE

## 2018-10-06 LAB — GLUCOSE, CAPILLARY
GLUCOSE-CAPILLARY: 178 mg/dL — AB (ref 70–99)
Glucose-Capillary: 175 mg/dL — ABNORMAL HIGH (ref 70–99)
Glucose-Capillary: 179 mg/dL — ABNORMAL HIGH (ref 70–99)

## 2018-10-06 LAB — CBC WITH DIFFERENTIAL/PLATELET
Abs Immature Granulocytes: 0.17 10*3/uL — ABNORMAL HIGH (ref 0.00–0.07)
Basophils Absolute: 0 10*3/uL (ref 0.0–0.1)
Basophils Relative: 0 %
Eosinophils Absolute: 0 10*3/uL (ref 0.0–0.5)
Eosinophils Relative: 0 %
HCT: 27.1 % — ABNORMAL LOW (ref 39.0–52.0)
Hemoglobin: 8.6 g/dL — ABNORMAL LOW (ref 13.0–17.0)
IMMATURE GRANULOCYTES: 1 %
Lymphocytes Relative: 7 %
Lymphs Abs: 0.9 10*3/uL (ref 0.7–4.0)
MCH: 28.8 pg (ref 26.0–34.0)
MCHC: 31.7 g/dL (ref 30.0–36.0)
MCV: 90.6 fL (ref 80.0–100.0)
Monocytes Absolute: 0.8 10*3/uL (ref 0.1–1.0)
Monocytes Relative: 6 %
NEUTROS PCT: 86 %
Neutro Abs: 10.9 10*3/uL — ABNORMAL HIGH (ref 1.7–7.7)
Platelets: 352 10*3/uL (ref 150–400)
RBC: 2.99 MIL/uL — ABNORMAL LOW (ref 4.22–5.81)
RDW: 13.6 % (ref 11.5–15.5)
WBC: 12.8 10*3/uL — ABNORMAL HIGH (ref 4.0–10.5)
nRBC: 0 % (ref 0.0–0.2)

## 2018-10-06 MED ORDER — FLUCONAZOLE 100 MG PO TABS
100.0000 mg | ORAL_TABLET | Freq: Every day | ORAL | Status: DC
  Administered 2018-10-06 – 2018-10-10 (×5): 100 mg via ORAL
  Filled 2018-10-06 (×5): qty 1

## 2018-10-06 NOTE — Progress Notes (Signed)
Clinical Social Worker acknowledge consult to assist patient in being place at a SNF. Patient has been followed for discharge and social needs. Patient at this time does not qualify for SNF placement. Patient has been assessed by CSW at [facility] and patient is unable to be place for rehab. CSW at [facility] spoke with Social Work AD "who stated that patient will have to stay in the hospital until radiation is over and patient would have to discharge home with family". Patient does not have a payer source and no facility has made a bed offer on behalf of patient. CSW will continue to follow for support.    Rhea Pink, MSW,  [place]

## 2018-10-06 NOTE — Progress Notes (Signed)
I have assumed care over this pt at this time. I agree with previous nurse's assessment. Pt resting comfortably with no requests. Will continue to monitor.

## 2018-10-06 NOTE — Progress Notes (Signed)
PT Cancellation Note  Patient Details Name: Ryan Medina MRN: [id number] DOB: 18-Dec-1960   Cancelled Treatment:    Reason Eval/Treat Not Completed: Pain limiting ability to participate. Pt declined to participate at this time. Will check back as schedule allows.    Weston Anna, PT Acute Rehabilitation Services Pager: [phone] Office: [phone]

## 2018-10-06 NOTE — Progress Notes (Signed)
PROGRESS NOTE    Kito Cuffe Lipsett  KDX:[id number] DOB: 03-04-1961 DOA: 09/03/2018 PCP: Patient, No Pcp Per  Brief Tish Frederickson yoM admitted with LLE pain found to have ischemic right leg and acute popliteal and tibial embolus s/p embolectomy 12/13. On heparin gtt. Developed acute RLL hematoma with compartment syndrome requiring fasciotomy 12/15. Decompensated 12/17 am with hypotension with abd distention and 2 massive bloody stools. Hgb drop 9.5 ->5.9. Getting 2 units PRBC, protonix, and GI consulted. Moved to ICU for hemodynamic instability. Had embolization of GDA 09/07/2018. Transferred out of ICU 12/18.  Transferred back to ICU 1/5 overnight for hypotension and recurrent anemia.  Patient transfused a total of 22 units packed red blood cells.  Patient seen by oncology and patient transferred to [facility] for radiation therapy to pancreatic mass.    Assessment & Plan:   Principal Problem:   Popliteal artery embolus (HCC) Active Problems:   DM (diabetes mellitus), type 2, uncontrolled ([place])   Essential hypertension   Dilated cardiomyopathy (HCC)   Chest pain   AAA (abdominal aortic aneurysm) (HCC)   Ischemic foot   Right leg claudication (HCC)   PVD (peripheral vascular disease) ([place])   Hemorrhagic shock (HCC)   Nontraumatic ischemic infarction of muscle of right lower leg   Upper GI bleed   Cocaine abuse ([place])   Acute blood loss anemia   Chronic pain syndrome   Duodenal ulcer with hemorrhage   Pancreatic cancer (HCC)   Goals of care, counseling/discussion   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  Noted to be nonresectable.  Patient seen in consultation by oncology and patient transferred to [facility] long to start radiation treatment.  Chemotherapy on hold for now per oncology. Per Oncology patient will likely need to stay in-house for entirety of his radiation treatment.  Patient already started with radiation treatments. Palliative care consulted and also following.  Per oncology.  2.  Acute hemorrhagic shock secondary to acute GI bleed Patient noted to be in acute hemorrhagic shock requiring pressors and octreotide which has since been discontinued.  EGD from 09/09/2018 showed bleeding duodenal ulcer likely etiology of patient's bleed with concerns for malignancy from invasive pancreatic cancer despite negative biopsy results.  Patient status post recurrent bleeding despite previous EGD/hemo-spray 09/07/2018.  Patient status post GDA embolization 09/07/2018.  Endoscopic ulcer appearance on 09/16/2018 showed deeply cratered, friable, intermittent oozing blood, firm.  IR holding on repeat angiogram/embolization..  Systolic blood pressures in the low 100s.  Patient currently asymptomatic.  Patient with black/melanotic stools alternating with brown stools ongoing.  Hemoglobin slowly trickling down and currently at 8.6 from 8.9 from 9.2 from 6.9 (10/02/2018) after 2 units of packed red blood cells.  Patient likely slowly losing some blood.  Patient status post 24 units packed red blood cells!!!  Patient status post GDA embolization per IR 09/07/2018.  Patient s/p IV iron per hematology/oncology.  Octreotide has been discontinued.  Continue PPI twice daily. Patient currently tolerating soft diet.  GI has reviewed chart again and feel patient's hemoglobin drifting down and dark stools not a surprise  and expect this to continue.  GI recommending transfusion for hemoglobin less than 7.  Follow.  3.  Rule out sepsis likely secondary to HCAP  and pseudomonas aeruginosa UTI Patient noted to be febrile the night of 10/01/2018, with T-max of 102.4.  Patient also noted to have a leukocytosis of 18.7 on 10/01/2018.  Chest x-ray done on 10/01/2018 consistent with a new patchy left lower lung opacity likely representing pneumonia.  Fever curve trending down.  Sputum Gram stain and cultures pending with no growth to date.  Urine Legionella antigen negative.  Urine pneumococcus antigen negative.  Blood cultures reordered and pending.  Urine cultures with greater than 100,000 pseudomonas aeruginosa.  Continue IV cefepime.  Discontinued IV vancomycin, as blood cultures remaining negative with no growth to date.  MRSA PCR.  Supportive care.  Follow.   4.  Acute blood loss anemia Secondary to problem #2.  Status post 24 units packed red blood cells.  Hemoglobin was at 6.9 on 10/02/2018 patient transfused 2 units packed red blood cells hemoglobin currently at 8.4.  Patient likely slowly losing blood from duodenal ulcer. Transfusion threshold hemoglobin less than 7.  Follow H&H.  5.  Extensive left popliteal artery embolus  status post embolectomy and fasciotomies of right lower extremity.  Staples have been removed.  Patient deemed not a candidate for anticoagulation due to GI bleed.  Pain management.  Per vascular surgery.  PT following.  Cam boot ordered for patient per PT.  6.??  Small bowel obstruction Gastric distention noted on CT abdomen with some distention.  Resolved.  Patient with no nausea or vomiting.  Patient states having stool.  No need for NG tube at this time.  Patient tolerating current soft diet.  Follow.     7.  Chronic systolic heart failure/diastolic cardiomyopathy Complicated by cocaine use.  Currently stable.  Supportive care.  8.  Cocaine abuse Patient counseled on polysubstance cessation.  9.  Diabetes mellitus II Hemoglobin A1c 18.2 on 08/23/2018. CBG of 222 today.  Diet has been advanced to a soft diet.  Patient noted to  have a CBG of 52 on 10/03/2018 at 1854 hrs. and as such patient's Lantus has been discontinued.  Continue sliding scale insulin.  10.  Tachycardia Patient denies any chest pain.  EKG with a sinus tachycardia.  TSH within normal limits at 2.551.  Heart rate improved on current regimen of Coreg.  Follow.      Nutrition Problem: Severe Malnutrition Etiology: chronic illness(Pancreatic cancer, DM and CHF)     Signs/Symptoms: severe muscle depletion, severe fat depletion, percent weight loss Percent weight loss: 23 %    Interventions: Ensure Enlive (each supplement provides 350kcal and 20 grams of protein)  Estimated body mass index is 17.71 kg/m as calculated from the following:   Height as of this encounter: 5\' 11"  (1.803 m).   Weight as of this encounter: 57.6 kg.  DVT prophylaxis: None due to recent GI bleed Code Status: Partial code Family Communication family in the room Disposition Plan: Patient needs to stay in hospital for radiation and to have a port placed then he will be discharged to shelter. Consultants:  Oncology Procedures: None Antimicrobials Cefepime Subjective: None resting in bed complains of some abdominal pain otherwise no nausea vomiting diarrhea.   Objective: Vitals:   10/05/18 2015 10/05/18 2219 10/06/18 0135 10/06/18 0428  BP: 130/83 124/90 111/81 109/78  Pulse: 92 89 82 86  Resp: 18     Temp: (!) 100.5 F (38.1  C) 99.7 F (37.6 C) 99.2 F (37.3 C) 99.2 F (37.3 C)  TempSrc: Oral Oral Oral Oral  SpO2: 100% 97% 97% 99%  Weight:      Height:        Intake/Output Summary (Last 24 hours) at 10/06/2018 1123 Last data filed at 10/06/2018 0600 Gross per 24 hour  Intake 880.05 ml  Output 650 ml  Net 230.05 ml   Filed Weights   09/16/18 1325 09/28/18 1500 09/28/18 1914  Weight: 66.7 kg 59 kg 57.6 kg    Examination: Nauseated chronically ill looking young gentleman General exam: Appears calm and comfortable  Respiratory system: Clear to  auscultation. Respiratory effort normal. Cardiovascular system: S1 & S2 heard, RRR. No JVD, murmurs, rubs, gallops or clicks. No pedal edema. Gastrointestinal system: Abdomen is nondistended, soft and nontender. No organomegaly or masses felt. Normal bowel sounds heard. Central nervous system: Alert and oriented. No focal neurological deficits. Extremities: Symmetric 5 x 5 power. Skin: No rashes, lesions or ulcers Psychiatry: Judgement and insight appear normal. Mood & affect appropriate.     Data Reviewed: I have personally reviewed following labs and imaging studies  CBC: Recent Labs  Lab 10/02/18 0559 10/03/18 0904 10/04/18 0346 10/05/18 0343 10/06/18 0238  WBC 12.2* 12.3* 14.2* 13.7* 12.8*  NEUTROABS  --  10.8* 12.2* 11.7* 10.9*  HGB 6.9* 9.2* 8.9* 8.4* 8.6*  HCT 21.8* 28.4* 27.6* 26.3* 27.1*  MCV 89.0 87.7 87.3 89.8 90.6  PLT 245 310 304 291 973   Basic Metabolic Panel: Recent Labs  Lab 09/30/18 0317 10/01/18 0319 10/02/18 0559 10/03/18 0904 10/04/18 0346 10/05/18 0343 10/06/18 0238  NA 135 133* 130* 134* 135 134* 134*  K 3.9 3.8 3.5 3.7 3.6 3.4* 3.8  CL 99 98 94* 98 99 100 97*  CO2 26 26 27 29 29 26 28   GLUCOSE 169* 214* 172* 246* 201* 200* 183*  BUN 14 13 13 11 9 7 8   CREATININE 0.51* 0.53* 0.54* 0.53* 0.48* 0.56* 0.46*  CALCIUM 7.3* 7.1* 6.9* 7.0* 7.1* 6.7* 7.0*  MG 1.5* 2.0  --   --   --   --   --    GFR: Estimated Creatinine Clearance: 83 mL/min (A) (by C-G formula based on SCr of 0.46 mg/dL (L)). Liver Function Tests: No results for input(s): AST, ALT, ALKPHOS, BILITOT, PROT, ALBUMIN in the last 168 hours. No results for input(s): LIPASE, AMYLASE in the last 168 hours. No results for input(s): AMMONIA in the last 168 hours. Coagulation Profile: Recent Labs  Lab 10/02/18 0910  INR 1.23   Cardiac Enzymes: No results for input(s): CKTOTAL, CKMB, CKMBINDEX, TROPONINI in the last 168 hours. BNP (last 3 results) No results for input(s): PROBNP in the  last 8760 hours. HbA1C: No results for input(s): HGBA1C in the last 72 hours. CBG: Recent Labs  Lab 10/05/18 0732 10/05/18 1143 10/05/18 1629 10/05/18 1949 10/06/18 0740  GLUCAP 222* 187* 100* 180* 178*   Lipid Profile: No results for input(s): CHOL, HDL, LDLCALC, TRIG, CHOLHDL, LDLDIRECT in the last 72 hours. Thyroid Function Tests: No results for input(s): TSH, T4TOTAL, FREET4, T3FREE, THYROIDAB in the last 72 hours. Anemia Panel: No results for input(s): VITAMINB12, FOLATE, FERRITIN, TIBC, IRON, RETICCTPCT in the last 72 hours. Sepsis Labs: Recent Labs  Lab 10/02/18 0910 10/02/18 1225  PROCALCITON 0.15  --   LATICACIDVEN 0.8 0.8    Recent Results (from the past 240 hour(s))  Culture, blood (Routine X 2) w Reflex to ID Panel  Status: None   Collection Time: 09/26/18  7:35 PM  Result Value Ref Range Status   Specimen Description BLOOD RIGHT ANTECUBITAL  Final   Special Requests IN PEDIATRIC BOTTLE Blood Culture adequate volume  Final   Culture   Final    NO GROWTH 5 DAYS Performed at [facility], [place] [address], [place], [place] [address]    Report Status 10/01/2018 FINAL  Final  Culture, blood (Routine X 2) w Reflex to ID Panel     Status: None   Collection Time: 10/01/18  9:29 AM  Result Value Ref Range Status   Specimen Description   Final    BLOOD RIGHT HAND Performed at [facility] [address] [place], [place] [address]    Special Requests   Final    BOTTLES DRAWN AEROBIC ONLY Blood Culture results may not be optimal due to an inadequate volume of blood received in culture bottles Performed at [facility] [address] [place], [place] [address]    Culture   Final    NO GROWTH 5 DAYS Performed at [facility], [place] [address], [place], [place] [address]    Report Status 10/06/2018 FINAL  Final  Culture, blood (Routine X 2) w Reflex to ID Panel     Status: None   Collection Time: 10/01/18  9:29  AM  Result Value Ref Range Status   Specimen Description BLOOD RIGHT ARM  Final   Special Requests   Final    BOTTLES DRAWN AEROBIC AND ANAEROBIC Blood Culture adequate volume Performed at [facility] [address], [place], [place] [address]    Culture   Final    NO GROWTH 5 DAYS Performed at [facility], [place] [address], [place], [place] [address]    Report Status 10/06/2018 FINAL  Final  Culture, Urine     Status: Abnormal   Collection Time: 10/01/18  2:12 PM  Result Value Ref Range Status   Specimen Description   Final    URINE, RANDOM Performed at [facility] [address] [place], [place] [address]    Special Requests   Final    NONE Performed at [facility], [place] [address], [place], [place] [address]    Culture >=100,000 COLONIES/mL PSEUDOMONAS AERUGINOSA (A)  Final   Report Status 10/03/2018 FINAL  Final   Organism ID, Bacteria PSEUDOMONAS AERUGINOSA (A)  Final      Susceptibility   Pseudomonas aeruginosa - MIC*    CEFTAZIDIME 4 SENSITIVE Sensitive     CIPROFLOXACIN <=0.25 SENSITIVE Sensitive     GENTAMICIN <=1 SENSITIVE Sensitive     IMIPENEM 1 SENSITIVE Sensitive     PIP/TAZO <=4 SENSITIVE Sensitive     CEFEPIME <=1 SENSITIVE Sensitive     * >=100,000 COLONIES/mL PSEUDOMONAS AERUGINOSA         Radiology Studies: No results found.      Scheduled Meds: . sodium chloride   Intravenous Once  . sodium chloride   Intravenous Once  . acetaminophen  325 mg Oral Once  . acetaminophen  500 mg Oral Once  . atorvastatin  20 mg Oral QHS  . carvedilol  3.125 mg Oral BID WC  . dronabinol  5 mg Oral BID AC  . feeding supplement (ENSURE ENLIVE)  237 mL Oral BID BM  . fluconazole  100 mg Oral Daily  . gabapentin  400 mg Oral TID PC & HS  .  guaiFENesin  1,200 mg Oral BID  . insulin aspart  0-9 Units Subcutaneous TID AC & HS  . lidocaine  15 mL Oral Once  . LORazepam  1 mg Intravenous Once  .  mouth rinse  15 mL Mouth Rinse BID  . pantoprazole  40 mg Oral BID  . senna-docusate  1 tablet Oral BID  . tamsulosin  0.4 mg Oral QHS   Continuous Infusions: . ceFEPime (MAXIPIME) IV 1 g (10/06/18 0826)     LOS: 33 days     Georgette Shell, MD [facility] If 7PM-7AM, please contact night-coverage [email or web address] Password [facility] 10/06/2018, 11:23 AM

## 2018-10-06 NOTE — Progress Notes (Signed)
OT Cancellation Note  Patient Details Name: Ryan Medina MRN: [id number] DOB: [birth year]   Cancelled Treatment:    Reason Eval/Treat Not Completed: Other (comment).  cx x 2:  Eating and now sleeping/not fully arousing. Will check back another day.  Ireoluwa Gorsline 10/06/2018, 2:51 PM  Lesle Chris, OTR/L Acute Rehabilitation Services [phone] [facility] pager [phone] office 10/06/2018

## 2018-10-06 NOTE — Progress Notes (Signed)
It looks like radiation therapy is going pretty well.  He is halfway through his radiation.  His abdomen is not nearly as distended.  His abdomen is actually nice and soft.  He says he is not noted any bleeding.  He has had no melena.  His hemoglobin is 8.6.  We will have to see what his iron studies look like.  He still has discomfort in the right leg.  I appreciate palliative care try to help out.  He definitely needs to have a place to stay once he is done with radiation.  We will think about getting a Port-A-Cath put in soon.  Once he completes radiation, will like to think about systemic chemotherapy.  His appetite is doing okay.  I am not sure if he is on a regular diet yet.  He has had no fever.  I think that physical therapy is trying to help him with ambulation.  This clearly is very important.  We will continue to follow along.  He might be developing a little bit of thrush in his mouth.  We will get him on some Diflucan.  All of his vital signs are stable.  Temperature is 99.2.  Pulse 86.  Blood pressure 109/78.  His abdomen is soft.  There is no distention.  There is no fluid.  Bowel sounds are present.  There is no abdominal mass.  There is no palpable liver or spleen tip.   [name], MD  Romans 8:28

## 2018-10-07 ENCOUNTER — Ambulatory Visit
Admit: 2018-10-07 | Discharge: 2018-10-07 | Disposition: A | Discharge status: Home / Self Care | Payer: Self-pay | Attending: Radiation Oncology | Admitting: Radiation Oncology

## 2018-10-07 LAB — TYPE AND SCREEN
ABO/RH(D): B POS
Antibody Screen: NEGATIVE
Unit division: 0
Unit division: 0

## 2018-10-07 LAB — COMPREHENSIVE METABOLIC PANEL
ALBUMIN: 1.2 g/dL — AB (ref 3.5–5.0)
ALT: 17 U/L (ref 0–44)
ANION GAP: 9 (ref 5–15)
AST: 16 U/L (ref 15–41)
Alkaline Phosphatase: 68 U/L (ref 38–126)
BUN: 8 mg/dL (ref 6–20)
CO2: 28 mmol/L (ref 22–32)
Calcium: 7.1 mg/dL — ABNORMAL LOW (ref 8.9–10.3)
Chloride: 97 mmol/L — ABNORMAL LOW (ref 98–111)
Creatinine, Ser: 0.51 mg/dL — ABNORMAL LOW (ref 0.61–1.24)
GFR calc non Af Amer: >60 mL/min (ref >60)
GLUCOSE: 205 mg/dL — AB (ref 70–99)
Potassium: 3.6 mmol/L (ref 3.5–5.1)
SODIUM: 134 mmol/L — AB (ref 135–145)
Total Bilirubin: 0.5 mg/dL (ref 0.3–1.2)
Total Protein: 4.8 g/dL — ABNORMAL LOW (ref 6.5–8.1)

## 2018-10-07 LAB — CBC
HCT: 27.7 % — ABNORMAL LOW (ref 39.0–52.0)
Hemoglobin: 8.8 g/dL — ABNORMAL LOW (ref 13.0–17.0)
MCH: 28.6 pg (ref 26.0–34.0)
MCHC: 31.8 g/dL (ref 30.0–36.0)
MCV: 89.9 fL (ref 80.0–100.0)
Platelets: 384 10*3/uL (ref 150–400)
RBC: 3.08 MIL/uL — ABNORMAL LOW (ref 4.22–5.81)
RDW: 13.6 % (ref 11.5–15.5)
WBC: 10.4 10*3/uL (ref 4.0–10.5)
nRBC: 0 % (ref 0.0–0.2)

## 2018-10-07 LAB — BPAM RBC
Blood Product Expiration Date: 202002112359
Blood Product Expiration Date: 202002112359
ISSUE DATE / TIME: 202001120349
UNIT TYPE AND RH: 7300
Unit Type and Rh: 7300

## 2018-10-07 LAB — GLUCOSE, CAPILLARY: Glucose-Capillary: 209 mg/dL — ABNORMAL HIGH (ref 70–99)

## 2018-10-07 MED ORDER — MORPHINE SULFATE ER 30 MG PO TBCR: 30.0000 mg | EXTENDED_RELEASE_TABLET | Freq: Two times a day (BID) | ORAL | Status: DC

## 2018-10-07 MED ORDER — MORPHINE SULFATE ER 15 MG PO TBCR
15.0000 mg | EXTENDED_RELEASE_TABLET | Freq: Two times a day (BID) | ORAL | Status: DC
  Administered 2018-10-07 – 2018-11-03 (×53): 15 mg via ORAL
  Filled 2018-10-07 (×53): qty 1

## 2018-10-07 MED ORDER — SODIUM CHLORIDE 0.9 % IV SOLN
1.0000 g | Freq: Three times a day (TID) | INTRAVENOUS | Status: AC
  Administered 2018-10-07 – 2018-10-08 (×2): 1 g via INTRAVENOUS
  Filled 2018-10-07 (×2): qty 1

## 2018-10-07 NOTE — Progress Notes (Signed)
PROGRESS NOTE    Ryan Medina  XBM:[id number] DOB: 1961/02/16 DOA: 09/03/2018 PCP: Patient, No Pcp Per  Brief Narrative:57 yoM admitted with LLE pain found to have ischemic right leg and acute popliteal and tibial embolus s/p embolectomy 12/13. On heparin gtt. Developed acute RLL hematoma with compartment syndrome requiring fasciotomy 12/15. Decompensated 12/17 am with hypotension with abd distention and 2 massive bloody stools. Hgb drop 9.5 ->5.9. Getting 2 units PRBC, protonix, and GI consulted. Moved to ICU for hemodynamic instability. Had embolization of GDA 09/07/2018. Transferred out of ICU 12/18.  Transferred back to ICU 1/5 overnight for hypotension and recurrent anemia. Patient transfused a total of 22 units packed red blood cells. Patient seen by oncology and patient transferred to [facility] for radiation therapy to pancreatic mass.   Assessment & Plan:   Principal Problem:   Popliteal artery embolus (HCC) Active Problems:   DM (diabetes mellitus), type 2, uncontrolled ([place])   Essential hypertension   Dilated cardiomyopathy (HCC)   Chest pain   AAA (abdominal aortic aneurysm) (HCC)   Ischemic foot   Right leg claudication (HCC)   PVD (peripheral vascular disease) ([place])   Hemorrhagic shock (HCC)   Nontraumatic ischemic infarction of muscle of right lower leg   Upper GI bleed   Cocaine abuse ([place])   Acute blood loss anemia   Chronic pain syndrome   Duodenal ulcer with hemorrhage   Pancreatic cancer (HCC)   Goals of care, counseling/discussion   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 Noted to be nonresectable. Patient seen in consultation by oncology and patient transferred to [facility] long to start radiation treatment. Chemotherapy on hold for now per oncology. Per Oncology patient will likely need to stay in-house for entirety of his radiation treatment. Patient already started with radiation treatments. Palliative care consulted and also following. Per oncology.  2. Acute hemorrhagic shock secondary to acute GI bleed Patient noted to be in acute hemorrhagic shock requiring pressors and octreotide which has since been discontinued. EGD from 09/09/2018 showed bleeding duodenal ulcer likely etiology of patient's bleed with concerns for malignancy from invasive pancreatic cancer despite negative biopsy results. Patient status post recurrent bleeding despite previous EGD/hemo-spray 09/07/2018. Patient status post GDA embolization 09/07/2018. Endoscopic ulcer appearance on 09/16/2018 showed deeply cratered, friable, intermittent oozing blood, firm. IR holding on repeat angiogram/embolization.. Systolic blood pressures in the low 100s. Patient currently asymptomatic. Patient with black/melanotic stools alternating with brown stools ongoing. Hemoglobin 8.8 Patient status post 24 units packed red blood cells!!! Patient status post GDA embolization per IR 09/07/2018. Patient s/p IV iron per hematology/oncology. Octreotide has been discontinued. Continue PPI twice daily. Patient currently tolerating soft diet. GI has reviewed chart again and feel patient's hemoglobin drifting down and dark stools not a surprise and expect this to continue. GI recommending transfusion for hemoglobin less than 7.     3.  sepsis likely secondary to HCAP and pseudomonas aeruginosa UTI s/p vanco and cefepime.finished the course of cefepime.vanco stopped mrsa pcr negative.  4. Acute blood loss anemia Secondary to problem #2.  Status post  24 units packed red blood cells. Hemoglobin was at 6.9 on 10/02/2018 patient transfused 2 units packed red blood cells hemoglobin currently at 8.4. Patient likely slowly losing blood from duodenal ulcer. Transfusion threshold hemoglobin less than 7. Follow H&H.  5. Extensive left popliteal artery embolus  status post embolectomy and fasciotomies of right lower extremity. Staples have been removed. Patient deemed not a candidate for anticoagulation due to GI bleed. Pain management. Per vascular surgery. PT following. Cam boot ordered for patient per PT.  6.?? Small bowel obstruction Gastric distention noted on CT abdomen with some distention. Resolved. Patient with no nausea or vomiting. Patient states having stool. No need for NG tube at this time. Patient tolerating current soft diet. Follow.   7. Chronic systolic heart failure/diastolic cardiomyopathy Complicated by cocaine use. Currently stable. Supportive care.  8. Cocaine abuse Patient counseled onpolysubstancecessation.  9. Diabetes mellitus II Hemoglobin A1c 18.2 on 08/23/2018.was on metformin levemir and novolog at home continue ssi  10. Tachycardia resolved     Nutrition Problem: Severe Malnutrition Etiology: chronic illness(Pancreatic cancer, DM and CHF)     Signs/Symptoms: severe muscle depletion, severe fat depletion, percent weight loss Percent weight loss: 23 %    Interventions: Ensure Enlive (each supplement provides 350kcal and 20 grams of protein)  Estimated body mass index is 17.71 kg/m as calculated from the following:   Height as of this encounter: 5\' 11"  (1.803 m).   Weight as of this encounter: 57.6 kg.  DVT prophylaxis: none due to gi bleed Code Status: Partial code Family Communication: fiance  in the room Disposition Plan: Patient is getting XRT to the 23rd plan discharge after that.   Consultants:  GI, oncology, interventional radiology,vascular pccm  palliative  Procedures:12/13 Admitted, right popliteal and tibial embolectomy with patch angioplasty 12/15 OR for evacuation of hematoma and lymphocele right leg, 4 compartment fasciotomy, placement of vac 12/17 GI and IR consult. Taken to IR for GDA embolization 12/19 EGD with hemospray applied to duodenal ulcer 12/20 OR for closure of medial and lateral fasciotomy incisions 12/21 CT A/P with pancreatic mass 12/23 CIR consult 12/26 EUS that showed pancreatic adenocarcinoma 12/30 oncology consult 1/2 radiation therapy feels XRT would not be of much benefit 1/4 IR consulted for port a cath placement 1/5 transferred back to ICU for hypotension and recurrent anemia. Since admit, had 14u PRBC's with additional 4 ordered 1/6 CT pancreas ordered by oncology 10/02/2018-To transfuse 2 units packed red blood cells  TTE 12/14 >EF 45-50%, G1DD. CTA PE 12/16 >No evidence of thoracic aortic aneurysm or dissection.Small bilateral effusions with associated bibasilarconsolidation. CTA A/P 12/21 >new pancreatic mass. Occluded GDA after coil embolization. Stable aneurysmal disease though has significant increase in mural thrombus within the dilated common iliac arteries bilaterally. MRI abd 12/22 > pancreatic neck mass c/w adenocarcinoma EUS 12/26 >pancreatic adenocarcinoma. Duodenal ulcer not c/w malignancy. TEE 12/26 > EF 50-55%, mild MR, no thrombus, negative bubble study. Renal US 1/2 > negative for hydro or acute process. CT pancreas 1/6 >Distention of gastric lumen to antrum, increase in size of pancreatic neck mass with involvement of superior mesenteric and celiac artery  12/13 LLE embolectomy  12/15 RLE fasciotomy  09/07/2018 intubation>> 09/07/2018 interventional radiology perform mesenteric angiography and cold embolization of the GDA   Antimicrobials:IV vancomycin 10/02/2018  IV cefepime 10/02/2018   Subjective: Patient sitting up in bed having an argument with his fiance  denies any pain chest pain shortness of breath nausea vomiting diarrhea  Objective: Vitals:   10/06/18  1942 10/07/18 0049 10/07/18 0433 10/07/18 1255  BP: 108/76 116/88 112/88 128/88  Pulse: 93 90 89 80  Resp: 14 16  20   Temp: 99.6 F (37.6 C) 100 F (37.8 C) 100.3 F (37.9 C) 99.2 F (37.3 C)  TempSrc: Oral Oral Oral Oral  SpO2: 96% 95% 94% 100%  Weight:      Height:        Intake/Output Summary (Last 24 hours) at 10/07/2018 1341 Last data filed at 10/07/2018 0600 Gross per 24 hour  Intake 620 ml  Output 150 ml  Net 470 ml   Filed Weights   09/16/18 1325 09/28/18 1500 09/28/18 1914  Weight: 66.7 kg 59 kg 57.6 kg    Examination:  General exam: Appears calm and comfortable  Respiratory system: Clear to auscultation. Respiratory effort normal. Cardiovascular system: S1 & S2 heard, RRR. No JVD, murmurs, rubs, gallops or clicks. No pedal edema. Gastrointestinal system: Abdomen is nondistended, soft and nontender. No organomegaly or masses felt. Normal bowel sounds heard. Central nervous system: Alert and oriented. No focal neurological deficits. Extremities: Symmetric 5 x 5 power. Skin: No rashes, lesions or ulcers Psychiatry: Judgement and insight appear normal. Mood & affect appropriate.     Data Reviewed: I have personally reviewed following labs and imaging studies  CBC: Recent Labs  Lab 10/03/18 0904 10/04/18 0346 10/05/18 0343 10/06/18 0238 10/07/18 0340  WBC 12.3* 14.2* 13.7* 12.8* 10.4  NEUTROABS 10.8* 12.2* 11.7* 10.9*  --   HGB 9.2* 8.9* 8.4* 8.6* 8.8*  HCT 28.4* 27.6* 26.3* 27.1* 27.7*  MCV 87.7 87.3 89.8 90.6 89.9  PLT 310 304 291 352 071   Basic Metabolic Panel: Recent Labs  Lab 10/01/18 0319  10/03/18 0904 10/04/18 0346 10/05/18 0343 10/06/18 0238 10/07/18 0340  NA 133*   < > 134* 135 134* 134* 134*  K 3.8   < > 3.7 3.6 3.4* 3.8 3.6  CL 98   < > 98 99 100 97* 97*  CO2 26   < > 29 29 26 28 28   GLUCOSE 214*   < > 246* 201* 200* 183* 205*   BUN 13   < > 11 9 7 8 8   CREATININE 0.53*   < > 0.53* 0.48* 0.56* 0.46* 0.51*  CALCIUM 7.1*   < > 7.0* 7.1* 6.7* 7.0* 7.1*  MG 2.0  --   --   --   --   --   --    < > = values in this interval not displayed.   GFR: Estimated Creatinine Clearance: 83 mL/min (A) (by C-G formula based on SCr of 0.51 mg/dL (L)). Liver Function Tests: Recent Labs  Lab 10/07/18 0340  AST 16  ALT 17  ALKPHOS 68  BILITOT 0.5  PROT 4.8*  ALBUMIN 1.2*   No results for input(s): LIPASE, AMYLASE in the last 168 hours. No results for input(s): AMMONIA in the last 168 hours. Coagulation Profile: Recent Labs  Lab 10/02/18 0910  INR 1.23   Cardiac Enzymes: No results for input(s): CKTOTAL, CKMB, CKMBINDEX, TROPONINI in the last 168 hours. BNP (last 3 results) No results for input(s): PROBNP in the last 8760 hours. HbA1C: No results for input(s): HGBA1C in the last 72 hours. CBG: Recent Labs  Lab 10/05/18 1949 10/06/18 0740 10/06/18 1138 10/06/18 1746 10/07/18 0727  GLUCAP 180* 178* 175* 179* 209*   Lipid Profile: No results for input(s): CHOL, HDL, LDLCALC, TRIG, CHOLHDL, LDLDIRECT in the last 72 hours. Thyroid Function Tests: No results  for input(s): TSH, T4TOTAL, FREET4, T3FREE, THYROIDAB in the last 72 hours. Anemia Panel: No results for input(s): VITAMINB12, FOLATE, FERRITIN, TIBC, IRON, RETICCTPCT in the last 72 hours. Sepsis Labs: Recent Labs  Lab 10/02/18 0910 10/02/18 1225  PROCALCITON 0.15  --   LATICACIDVEN 0.8 0.8    Recent Results (from the past 240 hour(s))  Culture, blood (Routine X 2) w Reflex to ID Panel     Status: None   Collection Time: 10/01/18  9:29 AM  Result Value Ref Range Status   Specimen Description   Final    BLOOD RIGHT HAND Performed at [facility] [address] [place], [place] [address]    Special Requests   Final    BOTTLES DRAWN AEROBIC ONLY Blood Culture results may not be optimal due to an inadequate volume of blood  received in culture bottles Performed at [facility] [address], [place], [place] [address]    Culture   Final    NO GROWTH 5 DAYS Performed at [facility], [place] [address] [place], [place] [address]    Report Status 10/06/2018 FINAL  Final  Culture, blood (Routine X 2) w Reflex to ID Panel     Status: None   Collection Time: 10/01/18  9:29 AM  Result Value Ref Range Status   Specimen Description BLOOD RIGHT ARM  Final   Special Requests   Final    BOTTLES DRAWN AEROBIC AND ANAEROBIC Blood Culture adequate volume Performed at [facility] [address] [place], [place] [address]    Culture   Final    NO GROWTH 5 DAYS Performed at [facility], [place] [address] [place], [place] [address]    Report Status 10/06/2018 FINAL  Final  Culture, Urine     Status: Abnormal   Collection Time: 10/01/18  2:12 PM  Result Value Ref Range Status   Specimen Description   Final    URINE, RANDOM Performed at [facility] [address] [place],  [address]    Special Requests   Final    NONE Performed at [facility], [place] [address] [place], [place] [address]    Culture >=100,000 COLONIES/mL PSEUDOMONAS AERUGINOSA (A)  Final   Report Status 10/03/2018 FINAL  Final   Organism ID, Bacteria PSEUDOMONAS AERUGINOSA (A)  Final      Susceptibility   Pseudomonas aeruginosa - MIC*    CEFTAZIDIME 4 SENSITIVE Sensitive     CIPROFLOXACIN <=0.25 SENSITIVE Sensitive     GENTAMICIN <=1 SENSITIVE Sensitive     IMIPENEM 1 SENSITIVE Sensitive     PIP/TAZO <=4 SENSITIVE Sensitive     CEFEPIME <=1 SENSITIVE Sensitive     * >=100,000 COLONIES/mL PSEUDOMONAS AERUGINOSA         Radiology Studies: No results found.      Scheduled Meds: . sodium chloride   Intravenous Once  . sodium chloride   Intravenous Once  . acetaminophen  325 mg Oral Once  . acetaminophen  500 mg Oral Once  . atorvastatin   20 mg Oral QHS  . carvedilol  3.125 mg Oral BID WC  . dronabinol  5 mg Oral BID AC  . feeding supplement (ENSURE ENLIVE)  237 mL Oral BID BM  . fluconazole  100 mg Oral Daily  . gabapentin  400 mg Oral TID PC & HS  . guaiFENesin  1,200 mg Oral BID  . insulin aspart  0-9  Units Subcutaneous TID AC & HS  . lidocaine  15 mL Oral Once  . LORazepam  1 mg Intravenous Once  . mouth rinse  15 mL Mouth Rinse BID  . pantoprazole  40 mg Oral BID  . senna-docusate  1 tablet Oral BID  . tamsulosin  0.4 mg Oral QHS   Continuous Infusions:   LOS: 34 days     Georgette Shell, MD  If 7PM-7AM, please contact night-coverage [email or web address] Password [email or web address] 10/07/2018, 1:41 PM

## 2018-10-07 NOTE — Progress Notes (Signed)
PT Cancellation Note  Patient Details Name: Ryan Medina MRN: [id number] DOB: [birth year]   Cancelled Treatment:    Reason Eval/Treat Not Completed: Patient's level of consciousness; pt lethargic, does not open eyes but moans and shakes head as if "no"' girlfriend reports pt has been amb to [facility] with boot, she states he is sleepy d/t pain meds; will continue attempts although this is the 3rd consecutive refusal   [facility] 10/07/2018, 3:01 PM

## 2018-10-07 NOTE — Progress Notes (Signed)
Patient ID: Ryan Medina, male   DOB: [birth year], 58 y.o.   MRN: [id number]  This NP visited patient at the bedside as a follow up for palliative medicine needs and emotional support.  Patient significant other is at the bedside, Mr. Mcginness is alert and oriented.    Patient reports that his pain is controlled with current medication regime  Patient verbalizes an understanding of the seriousness of his disease but remains hopeful for improvement Patient is open to all offered and available medical interventions to prolong life.   Discussed the importance of exploring his thoughts and feelings regarding his current disease, his quality of life and the "what ifs"   His main concern now is transition of care from the hospital; he and his significant other are homeless.    We discussed the limited options for disposition once his radiation is completed.  Encouraged Mr Keaney to explore possible options within his own family and community.  Questions and concerns addressed    Discussed with Dr. Zigmund Daniel and nursing  Palliative medicine team will continue to support holistically.  Total time spent on the unit was 35  minutes  Greater than 50% of the time was spent in counseling and coordination of care  Wadie Lessen NP  Palliative Medicine Team Team Phone # [phone] Pager [phone]

## 2018-10-08 ENCOUNTER — Ambulatory Visit
Admit: 2018-10-08 | Discharge: 2018-10-08 | Disposition: A | Discharge status: Home / Self Care | Payer: Self-pay | Attending: Radiation Oncology | Admitting: Radiation Oncology

## 2018-10-08 DIAGNOSIS — R531 Weakness: Secondary | ICD-10-CM

## 2018-10-08 LAB — GLUCOSE, CAPILLARY
Glucose-Capillary: 218 mg/dL — ABNORMAL HIGH (ref 70–99)
Glucose-Capillary: 175 mg/dL — ABNORMAL HIGH (ref 70–99)
Glucose-Capillary: 218 mg/dL — ABNORMAL HIGH (ref 70–99)
Glucose-Capillary: 204 mg/dL — ABNORMAL HIGH (ref 70–99)
Glucose-Capillary: 195 mg/dL — ABNORMAL HIGH (ref 70–99)
Glucose-Capillary: 197 mg/dL — ABNORMAL HIGH (ref 70–99)
Glucose-Capillary: 193 mg/dL — ABNORMAL HIGH (ref 70–99)
Glucose-Capillary: 177 mg/dL — ABNORMAL HIGH (ref 70–99)
Glucose-Capillary: 163 mg/dL — ABNORMAL HIGH (ref 70–99)
Glucose-Capillary: 162 mg/dL — ABNORMAL HIGH (ref 70–99)

## 2018-10-08 LAB — URINALYSIS, ROUTINE W REFLEX MICROSCOPIC
Bilirubin Urine: NEGATIVE
Glucose, UA: NEGATIVE mg/dL
Hgb urine dipstick: NEGATIVE
Ketones, ur: NEGATIVE mg/dL
Leukocytes, UA: NEGATIVE
NITRITE: NEGATIVE
Protein, ur: NEGATIVE mg/dL
Specific Gravity, Urine: 1.01 (ref 1.005–1.030)
pH: 7 (ref 5.0–8.0)

## 2018-10-08 LAB — CBC
HCT: 26.8 % — ABNORMAL LOW (ref 39.0–52.0)
Hemoglobin: 8.4 g/dL — ABNORMAL LOW (ref 13.0–17.0)
MCH: 27.9 pg (ref 26.0–34.0)
MCHC: 31.3 g/dL (ref 30.0–36.0)
MCV: 89 fL (ref 80.0–100.0)
Platelets: 410 10*3/uL — ABNORMAL HIGH (ref 150–400)
RBC: 3.01 MIL/uL — ABNORMAL LOW (ref 4.22–5.81)
RDW: 13.6 % (ref 11.5–15.5)
WBC: 10.8 10*3/uL — ABNORMAL HIGH (ref 4.0–10.5)
nRBC: 0 % (ref 0.0–0.2)

## 2018-10-08 MED ORDER — CEFAZOLIN SODIUM-DEXTROSE 2-4 GM/100ML-% IV SOLN: 2.0000 g | INTRAVENOUS | Status: AC

## 2018-10-08 NOTE — Progress Notes (Signed)
PROGRESS NOTE    [name]  TKZ:[id number] DOB: [birth year] DOA: 09/03/2018 PCP: Patient, No Pcp Per    Brief Narrative: 46 yoM admitted with LLE pain found to have ischemic right leg and acute popliteal and tibial embolus s/p embolectomy 12/13. On heparin gtt. Developed acute RLL hematoma with compartment syndrome requiring fasciotomy 12/15. Decompensated 12/17 am with hypotension with abd distention and 2 massive bloody stools. Hgb drop 9.5 ->5.9. Getting 2 units PRBC, protonix, and GI consulted. Moved to ICU for hemodynamic instability. Had embolization of GDA 09/07/2018. Transferred out of ICU 12/18.  Transferred back to ICU 1/5 overnight for hypotension and recurrent anemia. Patient transfused a total of 22 units packed red blood cells. Patient seen by oncology and patient transferred to [facility] for radiation therapy to pancreatic mass.   Assessment & Plan:   Principal Problem:   Popliteal artery embolus (HCC) Active Problems:   DM (diabetes mellitus), type 2, uncontrolled ([place])   Essential hypertension   Dilated cardiomyopathy (HCC)   Chest pain   AAA (abdominal aortic aneurysm) (HCC)   Ischemic foot   Right leg claudication (HCC)   PVD (peripheral vascular disease) ([place])   Hemorrhagic shock (HCC)   Nontraumatic ischemic infarction of muscle of right lower leg   Upper GI bleed   Cocaine abuse ([place])   Acute blood loss anemia   Chronic pain syndrome   Duodenal ulcer with hemorrhage   Pancreatic cancer (HCC)   Goals of care, counseling/discussion   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 Noted to be nonresectable. Patient seen in consultation by oncology and patient transferred to [facility] long to start radiation treatment. Chemotherapy on hold for now per oncology. Per Oncology patient will likely need to stay in-house for entirety of his radiation treatment. Patient already started with radiation treatments last treatment is on the 22nd.  Will consult IR for port placement as oncology planning to try to start him on chemotherapy as an outpatient.  Palliative care consulted and also following. Per oncology.  2. Acute hemorrhagic shock secondary to acute GI bleed Patient noted to be in acute hemorrhagic shock requiring pressors and octreotide which has since been discontinued. EGD from 09/09/2018 showed bleeding duodenal ulcer likely etiology of patient's bleed with concerns for malignancy from invasive pancreatic cancer despite negative biopsy results. Patient status post recurrent bleeding despite previous EGD/hemo-spray 09/07/2018. Patient status post GDA embolization 09/07/2018. Endoscopic ulcer appearance on 09/16/2018 showed deeply cratered, friable, intermittent oozing blood, firm. IR holding on repeat angiogram/embolization.. Systolic blood pressures in the low 100s. Patient currently asymptomatic. Patient with black/melanotic stools alternating with brown stools ongoing. Hemoglobin 8.8 Patient status post 24 units packed red blood cells!!!Patient status post GDA embolization per IR 09/07/2018. Patient s/p IV iron per hematology/oncology. Octreotide has been discontinued. Continue PPI twice daily. Patient currently tolerating soft diet. GI has reviewed chart again and feel patient's hemoglobin drifting down and dark stools not a surprise and expect this to continue. GI recommending transfusion for hemoglobin less than 7.      3.  sepsis likely secondary to HCAP and  pseudomonas aeruginosa UTI s/p vanco and cefepime.finished the course of cefepime.vanco stopped mrsa pcr negative.  4. Acute blood loss anemia Secondary to problem #2. Status post 24 units packed red blood cells. Hemoglobin was at 6.9 on 10/02/2018 patient transfused 2 units packed red blood cells hemoglobin currently at 8.4. Patient likely slowly losing blood from duodenal ulcer. Transfusion threshold hemoglobin less than 7. Follow H&H.  5. Extensive left popliteal artery embolus  status post embolectomy and fasciotomies of right lower extremity. Staples have been removed. Patient deemed not a candidate for anticoagulation due to GI bleed. Pain management. Per vascular surgery. PT following. Cam boot ordered for patient per PT.  6.?? Small bowel obstruction Gastric distention noted on CT abdomen with some distention. Resolved. Patient with no nausea or vomiting. Patient states having stool. No need for NG tube at this time. Patient tolerating current soft diet. Follow.   7. Chronic systolic heart failure/diastolic cardiomyopathy Complicated by cocaine use. Currently stable. Supportive care.  8. Cocaine abuse Patient counseled onpolysubstancecessation.  9. Diabetes mellitus II Hemoglobin A1c 18.2 on 08/23/2018.was on metformin levemir and novolog at home continue ssi    Nutrition Problem: Severe Malnutrition Etiology: chronic illness(Pancreatic cancer, DM and CHF)     Signs/Symptoms: severe muscle depletion, severe fat depletion, percent weight loss Percent weight loss: 23 %    Interventions: Ensure Enlive (each supplement provides 350kcal and 20 grams of protein)  Estimated body mass index is 17.71 kg/m as calculated from the following:   Height as of this encounter: 5\' 11"  (1.803 m).   Weight as of this encounter: 57.6 kg.  DVT prophylaxis: None due to GI bleed Code Status: Partial code Family Communication: Fianc in the room Disposition  Plan: Pending XRT treatments  Consultants: Oncology  Procedures:  Antimicrobials: None  Subjective:  He is requesting a brace for his right foot Objective: Vitals:   10/07/18 2022 10/08/18 0046 10/08/18 0403 10/08/18 1157  BP: 115/82 124/85 119/85 (!) 115/91  Pulse: 83 83 82 78  Resp: 18 20 16 15   Temp: 99.6 F (37.6 C) 100.2 F (37.9 C) 99.5 F (37.5 C) 98.8 F (37.1 C)  TempSrc: Oral Oral Oral Oral  SpO2: 100% 96% 94% 100%  Weight:      Height:        Intake/Output Summary (Last 24 hours) at 10/08/2018 1247 Last data filed at 10/08/2018 0727 Gross per 24 hour  Intake 120 ml  Output 675 ml  Net -555 ml   Filed Weights   09/16/18 1325 09/28/18 1500 09/28/18 1914  Weight: 66.7 kg 59 kg 57.6 kg    Examination:  General exam: Appears calm and comfortable  Respiratory system: Clear to auscultation. Respiratory effort normal. Cardiovascular system: S1 & S2 heard, RRR. No JVD, murmurs, rubs, gallops or clicks. No pedal edema. Gastrointestinal system: Abdomen is nondistended, soft and nontender. No organomegaly or masses felt. Normal bowel sounds heard. Central nervous system: Alert and oriented. No focal neurological deficits. Extremities: Symmetric 5 x 5 power. Skin: No rashes, lesions or ulcers Psychiatry: Judgement and insight appear normal. Mood & affect appropriate.     Data Reviewed: I have personally reviewed following labs and imaging studies  CBC: Recent Labs  Lab 10/03/18 0904 10/04/18 0346 10/05/18 0343 10/06/18 0238 10/07/18 0340 10/08/18 0359  WBC 12.3* 14.2* 13.7* 12.8* 10.4 10.8*  NEUTROABS 10.8* 12.2* 11.7* 10.9*  --   --   HGB 9.2* 8.9* 8.4* 8.6* 8.8* 8.4*  HCT 28.4* 27.6* 26.3* 27.1* 27.7* 26.8*  MCV 87.7 87.3 89.8 90.6 89.9 89.0  PLT 310 304 291 352 384 882*   Basic Metabolic Panel: Recent Labs  Lab 10/03/18 0904 10/04/18 0346 10/05/18 0343 10/06/18 0238 10/07/18 0340  NA 134* 135 134* 134* 134*  K 3.7 3.6 3.4* 3.8 3.6  CL 98  99 100 97* 97*  CO2 29 29 26 28 28   GLUCOSE 246* 201* 200* 183* 205*  BUN 11 9 7 8 8   CREATININE 0.53* 0.48* 0.56* 0.46* 0.51*  CALCIUM 7.0* 7.1* 6.7* 7.0* 7.1*   GFR: Estimated Creatinine Clearance: 83 mL/min (A) (by C-G formula based on SCr of 0.51 mg/dL (L)). Liver Function Tests: Recent Labs  Lab 10/07/18 0340  AST 16  ALT 17  ALKPHOS 68  BILITOT 0.5  PROT 4.8*  ALBUMIN 1.2*   No results for input(s): LIPASE, AMYLASE in the last 168 hours. No results for input(s): AMMONIA in the last 168 hours. Coagulation Profile: Recent Labs  Lab 10/02/18 0910  INR 1.23   Cardiac Enzymes: No results for input(s): CKTOTAL, CKMB, CKMBINDEX, TROPONINI in the last 168 hours. BNP (last 3 results) No results for input(s): PROBNP in the last 8760 hours. HbA1C: No results for input(s): HGBA1C in the last 72 hours. CBG: Recent Labs  Lab 10/07/18 1253 10/07/18 1716 10/07/18 2134 10/08/18 0725 10/08/18 1108  GLUCAP 218* 204* 195* 197* 193*   Lipid Profile: No results for input(s): CHOL, HDL, LDLCALC, TRIG, CHOLHDL, LDLDIRECT in the last 72 hours. Thyroid Function Tests: No results for input(s): TSH, T4TOTAL, FREET4, T3FREE, THYROIDAB in the last 72 hours. Anemia Panel: No results for input(s): VITAMINB12, FOLATE, FERRITIN, TIBC, IRON, RETICCTPCT in the last 72 hours. Sepsis Labs: Recent Labs  Lab 10/02/18 0910 10/02/18 1225  PROCALCITON 0.15  --   LATICACIDVEN 0.8 0.8    Recent Results (from the past 240 hour(s))  Culture, blood (Routine X 2) w Reflex to ID Panel     Status: None   Collection Time: 10/01/18  9:29 AM  Result Value Ref Range Status   Specimen Description   Final    BLOOD RIGHT HAND Performed at [facility] [address] [place], [place] [address]    Special Requests   Final    BOTTLES DRAWN AEROBIC ONLY Blood Culture results may not be optimal due to an inadequate volume of blood received in culture bottles Performed at [facility] [address] [place], [place] [address]    Culture   Final    NO GROWTH 5 DAYS Performed at [facility], [place] [address], [place], [place] [address]    Report Status 10/06/2018 FINAL  Final  Culture, blood (Routine X 2) w Reflex to ID Panel     Status: None   Collection Time: 10/01/18  9:29 AM  Result Value Ref Range Status   Specimen Description BLOOD RIGHT ARM  Final   Special Requests   Final    BOTTLES DRAWN AEROBIC AND ANAEROBIC Blood Culture adequate volume Performed at [facility] [address], [place], [place] [address]    Culture   Final    NO GROWTH 5 DAYS Performed at [facility], [place] [address], [place], [place] [address]    Report Status 10/06/2018 FINAL  Final  Culture, Urine     Status: Abnormal   Collection Time: 10/01/18  2:12 PM  Result Value Ref Range Status   Specimen  Description   Final    URINE, RANDOM Performed at [facility], [place] [address], [place], [place] [address]    Special Requests   Final    NONE Performed at [facility], [place] [address] [place], [place] [address]    Culture >=100,000 COLONIES/mL PSEUDOMONAS AERUGINOSA (A)  Final   Report Status 10/03/2018 FINAL  Final   Organism ID, Bacteria PSEUDOMONAS AERUGINOSA (A)  Final      Susceptibility   Pseudomonas aeruginosa - MIC*    CEFTAZIDIME 4 SENSITIVE Sensitive     CIPROFLOXACIN <=0.25 SENSITIVE Sensitive     GENTAMICIN <=1 SENSITIVE Sensitive     IMIPENEM 1 SENSITIVE Sensitive     PIP/TAZO <=4 SENSITIVE Sensitive     CEFEPIME <=1 SENSITIVE Sensitive     * >=100,000 COLONIES/mL PSEUDOMONAS AERUGINOSA         Radiology Studies: No results found.      Scheduled Meds: . sodium chloride   Intravenous Once  . sodium chloride   Intravenous Once  . acetaminophen  325 mg Oral Once  . acetaminophen  500 mg Oral Once  . atorvastatin  20 mg Oral QHS  . carvedilol  3.125 mg Oral BID WC    . dronabinol  5 mg Oral BID AC  . feeding supplement (ENSURE ENLIVE)  237 mL Oral BID BM  . fluconazole  100 mg Oral Daily  . gabapentin  400 mg Oral TID PC & HS  . guaiFENesin  1,200 mg Oral BID  . insulin aspart  0-9 Units Subcutaneous TID AC & HS  . lidocaine  15 mL Oral Once  . LORazepam  1 mg Intravenous Once  . mouth rinse  15 mL Mouth Rinse BID  . morphine  15 mg Oral Q12H  . pantoprazole  40 mg Oral BID  . senna-docusate  1 tablet Oral BID  . tamsulosin  0.4 mg Oral QHS   Continuous Infusions:   LOS: 35 days     Georgette Shell, MD [facility]  If 7PM-7AM, please contact night-coverage [email or web address] Password [facility] 10/08/2018, 12:47 PM

## 2018-10-08 NOTE — Progress Notes (Addendum)
Referring Physician(s): Ennever,P  Supervising Physician: Marybelle Killings  Patient Status:  Ryan Medina - In-pt  Chief Complaint:  Pancreatic cancer  Subjective: Patient familiar to IR service from prior prophylactic embolization of the gastroduodenal artery on 09/07/2018 secondary to GI bleed.  He has a known history of hypertension, hyperlipidemia, dilated cardiomyopathy, AAA, pancreatic cancer, diabetes and chronic back pain.  He also has a history of right popliteal artery embolus, status post right popliteal and tibial embolectomy with patch angioplasty of popliteal and tibial arteries on 09/03/2018.  He is currently undergoing radiation therapy and request now received for Port-A-Cath placement for chemotherapy.  He denies fever, headache, chest pain, dyspnea, cough, worsening abdominal pain, nausea, vomiting or bleeding. He still has some discomfort in his right leg.  Past Medical History:  Diagnosis Date  . Back pain, chronic   . Diabetes ([place])   . Dilated cardiomyopathy ([place])    Echo 2/18: EF 45-50, diff HK, Gr 1 DD, trivial AI/TR  . Goals of care, counseling/discussion 09/24/2018  . HLD (hyperlipidemia)   . Hypertension   . IDDM (insulin dependent diabetes mellitus) ([place])   . Ruptured lumbar disc    Past Surgical History:  Procedure Laterality Date  . ABDOMINAL AORTOGRAM W/LOWER EXTREMITY Right 09/03/2018   Procedure: ABDOMINAL AORTOGRAM W/LOWER EXTREMITY;  Surgeon: Angelia Mould, MD;  Location: [place] [facility];  Service: Cardiovascular;  Laterality: Right;  . ABDOMINAL SURGERY    . APPENDECTOMY    . APPLICATION OF WOUND VAC Right 09/05/2018   Procedure: APPLICATION OF WOUND VAC RIGHT LOWER MEDIAL AND LATERAL FASCIOTOMY SITE;  Surgeon: Angelia Mould, MD;  Location: [facility];  Service: Vascular;  Laterality: Right;  . BACK SURGERY    . BIOPSY  09/16/2018   Procedure: BIOPSY;  Surgeon: Milus Banister, MD;  Location: [facility];  Service: Endoscopy;;  .  CARDIAC CATHETERIZATION N/A 05/19/2016   Procedure: Left Heart Cath and Coronary Angiography;  Surgeon: Leonie Man, MD;  Location: [place] [facility];  Service: Cardiovascular;  Laterality: N/A;  . EMBOLECTOMY Right 09/03/2018   Procedure: Embolectomy Right Popliteal and Tibial Artery, Bovine Pericardium Patch Angioplasty Right Popliteal Artery;  Surgeon: Angelia Mould, MD;  Location: [place];  Service: Vascular;  Laterality: Right;  . EMBOLECTOMY Right 09/10/2018   Procedure: EMBOLECTOMY RIGHT LOWER EXTREMITY;  Surgeon: Angelia Mould, MD;  Location: [facility];  Service: Vascular;  Laterality: Right;  . ESOPHAGOGASTRODUODENOSCOPY N/A 09/07/2018   Procedure: ESOPHAGOGASTRODUODENOSCOPY (EGD);  Surgeon: Irving Copas., MD;  Location: [facility];  Service: Gastroenterology;  Laterality: N/A;  . ESOPHAGOGASTRODUODENOSCOPY (EGD) WITH PROPOFOL N/A 09/16/2018   Procedure: ESOPHAGOGASTRODUODENOSCOPY (EGD) WITH PROPOFOL;  Surgeon: Milus Banister, MD;  Location[facility] ENDOSCOPY;  Service: Endoscopy;  Laterality: N/A;  . EUS N/A 09/16/2018   Procedure: ESOPHAGEAL ENDOSCOPIC ULTRASOUND (EUS) RADIAL;  Surgeon: Milus Banister, MD;  Location[facility];  Service: Endoscopy;  Laterality: N/A;  . FASCIOTOMY Right 09/05/2018   Procedure: FOUR COMPARTMENT FASCIOTOMY RIGHT LOWER LEG ;  Surgeon: Angelia Mould, MD;  Location: [place];  Service: Vascular;  Laterality: Right;  . FASCIOTOMY CLOSURE Right 09/10/2018   Procedure: FASCIOTOMY CLOSURE FOUR COMPARTMENT;  Surgeon: Angelia Mould, MD;  Location: [facility];  Service: Vascular;  Laterality: Right;  . FINE NEEDLE ASPIRATION  09/16/2018   Procedure: FINE NEEDLE ASPIRATION (FNA) LINEAR;  Surgeon: Milus Banister, MD;  Location: [facility] ENDOSCOPY;  Service: Endoscopy;;  . HEMATOMA EVACUATION Right 09/05/2018  Procedure: EVACUATION HEMATOMA OF RIGHT LOWER EXTREMITY; EVACUATION OF LYMPHOCELE RIGHT LOWER LEG ;  Surgeon: Angelia Mould, MD;  Location: [place];  Service: Vascular;  Laterality: Right;  . IR ANGIOGRAM SELECTIVE EACH ADDITIONAL VESSEL  09/07/2018  . IR ANGIOGRAM SELECTIVE EACH ADDITIONAL VESSEL  09/07/2018  . IR ANGIOGRAM VISCERAL SELECTIVE  09/07/2018  . IR ANGIOGRAM VISCERAL SELECTIVE  09/07/2018  . IR EMBO ART  VEN HEMORR LYMPH EXTRAV  INC GUIDE ROADMAPPING  09/07/2018  . IR US GUIDE VASC ACCESS RIGHT  09/07/2018  . TEE WITHOUT CARDIOVERSION N/A 09/16/2018   Procedure: TRANSESOPHAGEAL ECHOCARDIOGRAM (TEE);  Surgeon: Milus Banister, MD;  Location: [facility] ENDOSCOPY;  Service: Endoscopy;  Laterality: N/A;  if cleared by GI      Allergies: Lisinopril  Medications: Prior to Admission medications   Medication Sig Start Date End Date Taking? Authorizing Provider  amLODipine (NORVASC) 10 MG tablet Take 1 tablet (10 mg total) by mouth daily. 08/25/18  Yes Ghimire, Henreitta Leber, MD  atorvastatin (LIPITOR) 20 MG tablet Take 1 tablet (20 mg total) by mouth daily. 08/25/18  Yes Ghimire, Henreitta Leber, MD  cyclobenzaprine (FLEXERIL) 10 MG tablet Take 1 tablet (10 mg total) by mouth 2 (two) times daily as needed for muscle spasms. 08/25/18  Yes Ghimire, Henreitta Leber, MD  gabapentin (NEURONTIN) 300 MG capsule Take 1 capsule (300 mg total) by mouth 3 (three) times daily. 08/25/18  Yes Ghimire, Henreitta Leber, MD  insulin aspart (NOVOLOG) 100 UNIT/ML injection Inject 0-15 Units into the skin 3 (three) times daily with meals. 0-15 Units, Subcutaneous, 3 times daily with meals CBG < 70: implement hypoglycemia protocol CBG 70 - 120: 0 units CBG 121 - 150: 2 units CBG 151 - 200: 3 units CBG 201 - 250: 5 units CBG 251 - 300: 8 units CBG 301 - 350: 11 units CBG 351 - 400: 15 units CBG > 400: call MD 08/25/18  Yes Ghimire, Henreitta Leber, MD  insulin detemir (LEVEMIR) 100 UNIT/ML injection Inject 0.3 mLs (30 Units total) into the skin 2 (two) times daily. Patient taking differently: Inject 20-30 Units into the skin See admin  instructions. Take 30 units in the morning, and 20 units in the evening 08/25/18  Yes Ghimire, Henreitta Leber, MD  metFORMIN (GLUCOPHAGE) 1000 MG tablet Take 1 tablet (1,000 mg total) by mouth 2 (two) times daily with a meal. 08/25/18  Yes Ghimire, Henreitta Leber, MD  nitroGLYCERIN (NITROSTAT) 0.4 MG SL tablet Place 1 tablet (0.4 mg total) under the tongue every 5 (five) minutes as needed for chest pain. 08/25/18  Yes Ghimire, Henreitta Leber, MD  dicyclomine (BENTYL) 20 MG tablet Take 1 tablet (20 mg total) by mouth 2 (two) times daily. Patient not taking: Reported on 09/05/2018 08/25/18   Jonetta Osgood, MD  glucose blood (AGAMATRIX PRESTO TEST) test strip Check blood sugars prior to meals and at bedtime 08/25/18   Ghimire, Henreitta Leber, MD     Vital Signs: BP (!) 115/91 (BP Location: Left Arm)   Pulse 78   Temp 98.8 F (37.1 C) (Oral)   Resp 15   Ht 5\' 11"  (1.803 m)   Wt 126 lb 15.8 oz (57.6 kg)   SpO2 100%   BMI 17.71 kg/m   Physical Exam awake, alert.  Chest clear to auscultation bilaterally.  Heart with regular rate and rhythm.  Abdomen soft, positive bowel sounds, nontender. Moving all fours ok.  Imaging: No results found.  Labs:  CBC: Recent Labs  10/05/18 0343 10/06/18 0238 10/07/18 0340 10/08/18 0359  WBC 13.7* 12.8* 10.4 10.8*  HGB 8.4* 8.6* 8.8* 8.4*  HCT 26.3* 27.1* 27.7* 26.8*  PLT 291 352 384 410*    COAGS: Recent Labs    09/03/18 1325 09/07/18 1633 09/11/18 1624 09/12/18 0415 09/27/18 0616 10/02/18 0910  INR 1.03 1.28  --   --  1.50 1.23  APTT  --   --  30 30  --  32    BMP: Recent Labs    10/04/18 0346 10/05/18 0343 10/06/18 0238 10/07/18 0340  NA 135 134* 134* 134*  K 3.6 3.4* 3.8 3.6  CL 99 100 97* 97*  CO2 29 26 28 28   GLUCOSE 201* 200* 183* 205*  BUN 9 7 8 8   CALCIUM 7.1* 6.7* 7.0* 7.1*  CREATININE 0.48* 0.56* 0.46* 0.51*  GFRNONAA >60 >60 >60 >60  GFRAA >60 >60 >60 >60    LIVER FUNCTION TESTS: Recent Labs    09/22/18 0436 09/25/18 2233  09/26/18 0413 10/07/18 0340  BILITOT 0.4 0.4 0.5 0.5  AST 16 15 15 16   ALT 21 19 20 17   ALKPHOS 65 63 68 68  PROT 5.3* 5.9* 5.9* 4.8*  ALBUMIN 1.6* 1.8* 1.8* 1.2*    Assessment and Plan: Pt with history of hypertension, hyperlipidemia, dilated cardiomyopathy, AAA, pancreatic cancer, diabetes and chronic back pain.  Status post prophylactic embolization of GDA on 09/07/2018 for GI bleed.  He also has a history of right popliteal artery embolus, status post right popliteal and tibial embolectomy with patch angioplasty of popliteal and tibial arteries on 09/03/2018.  He has been recently treated for pneumonia and UTI.  He is currently undergoing radiation therapy and request now received for Port-A-Cath placement for chemotherapy.Risks and benefits of image guided port-a-catheter placement was discussed with the patient/girlfriend including, but not limited to bleeding, infection, pneumothorax, or fibrin sheath development and need for additional procedures.  All of the patient's questions were answered, patient is agreeable to proceed. Consent signed and in chart.  Procedure tentatively scheduled for 1/20   Electronically Signed: D. Rowe Robert, PA-C 10/08/2018, 1:36 PM   I spent a total of 25 minutes at the the patient's bedside AND on the patient's hospital floor or unit, greater than 50% of which was counseling/coordinating care for port a cath placement    Patient ID: Ryan Medina, male   DOB: August 28, 1961, 58 y.o.   MRN: [id number]

## 2018-10-09 DIAGNOSIS — Z79899 Other long term (current) drug therapy: Secondary | ICD-10-CM

## 2018-10-09 LAB — GLUCOSE, CAPILLARY
GLUCOSE-CAPILLARY: 217 mg/dL — AB (ref 70–99)
Glucose-Capillary: 176 mg/dL — ABNORMAL HIGH (ref 70–99)
Glucose-Capillary: 140 mg/dL — ABNORMAL HIGH (ref 70–99)
Glucose-Capillary: 171 mg/dL — ABNORMAL HIGH (ref 70–99)

## 2018-10-09 LAB — CBC
HEMATOCRIT: 28.4 % — AB (ref 39.0–52.0)
Hemoglobin: 8.9 g/dL — ABNORMAL LOW (ref 13.0–17.0)
MCH: 27.7 pg (ref 26.0–34.0)
MCHC: 31.3 g/dL (ref 30.0–36.0)
MCV: 88.5 fL (ref 80.0–100.0)
Platelets: 462 10*3/uL — ABNORMAL HIGH (ref 150–400)
RBC: 3.21 MIL/uL — ABNORMAL LOW (ref 4.22–5.81)
RDW: 13.6 % (ref 11.5–15.5)
WBC: 8.8 10*3/uL (ref 4.0–10.5)
nRBC: 0 % (ref 0.0–0.2)

## 2018-10-09 NOTE — Progress Notes (Signed)
PROGRESS NOTE    Ryan Medina  DGL:[id number] DOB: 11/04/1960 DOA: 09/03/2018 PCP: Patient, No Pcp Per   Brief Narrative:57 yoM admitted with LLE pain found to have ischemic right leg and acute popliteal and tibial embolus s/p embolectomy 12/13. On heparin gtt. Developed acute RLL hematoma with compartment syndrome requiring fasciotomy 12/15. Decompensated 12/17 am with hypotension with abd distention and 2 massive bloody stools. Hgb drop 9.5 ->5.9. Getting 2 units PRBC, protonix, and GI consulted. Moved to ICU for hemodynamic instability. Had embolization of GDA 09/07/2018. Transferred out of ICU 12/18.  Transferred back to ICU 1/5 overnight for hypotension and recurrent anemia. Patient transfused a total of 22 units packed red blood cells. Patient seen by oncology and patient transferred to [facility] for radiation therapy to pancreatic mass.   Assessment & Plan:   Principal Problem:   Popliteal artery embolus (HCC) Active Problems:   DM (diabetes mellitus), type 2, uncontrolled ([place])   Essential hypertension   Dilated cardiomyopathy (HCC)   Chest pain   AAA (abdominal aortic aneurysm) (HCC)   Ischemic foot   Right leg claudication (HCC)   PVD (peripheral vascular disease) ([place])   Hemorrhagic shock (HCC)   Nontraumatic ischemic infarction of muscle of right lower leg   Upper GI bleed   Cocaine abuse ([place])   Acute blood loss anemia   Chronic pain syndrome   Duodenal ulcer with hemorrhage   Pancreatic cancer (HCC)   Goals of care, counseling/discussion   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 Noted to be nonresectable. Patient seen in consultation by oncology and patient transferred to [facility] long to start radiation treatment. Chemotherapy on hold for now per oncology. Per Oncology patient will likely need to stay in-house for entirety of his radiation treatment. Patient already started with radiation treatments last treatment is on the 22nd.  IR to place port Monday  2. Acute hemorrhagic shock secondary to acute GI bleed Patient noted to be in acute hemorrhagic shock requiring pressors and octreotide which has since been discontinued. EGD from 09/09/2018 showed bleeding duodenal ulcer likely etiology of patient's bleed with concerns for malignancy from invasive pancreatic cancer despite negative biopsy results. Patient status post recurrent bleeding despite previous EGD/hemo-spray 09/07/2018. Patient status post GDA embolization 09/07/2018. Endoscopic ulcer appearance on 09/16/2018 showed deeply cratered, friable, intermittent oozing blood, firm. IR holding on repeat angiogram/embolization.. Systolic blood pressures in the low 100s. Patient currently asymptomatic. Patient with black/melanotic stools alternating with brown stools ongoing. Hemoglobin 8.8Patient status post 24 units packed red blood cells!!!Patient status post GDA embolization per IR 09/07/2018. Patient s/p IV iron per hematology/oncology. Octreotide has been discontinued. Continue PPI twice daily. Patient currently tolerating soft diet. GI has reviewed chart again and feel patient's hemoglobin drifting down and dark stools not a surprise and expect this to continue. GI recommending transfusion for hemoglobin less than 7.     3.  sepsis likely secondary to HCAP and pseudomonas aeruginosa UTIs/p vanco and cefepime.finished the course of cefepime.vanco stopped mrsa pcr negative.  4. Acute blood loss  anemia  Secondary to problem #2. Status post 24 units packed red blood cells. Hemoglobin was at 6.9 on 10/02/2018 patient transfused 2 units packed red blood cells hemoglobin currently at 8.4. Patient likely slowly losing blood from duodenal ulcer. Transfusion threshold hemoglobin less than 7. Follow H&H.  5. Extensive left popliteal artery embolus  status post embolectomy and fasciotomies of right lower extremity. Staples have been removed. Patient deemed not a candidate for anticoagulation due to GI bleed. Pain management. Per vascular surgery. PT following. Cam boot ordered for patient per PT.  6.?? Small bowel obstruction Gastric distention noted on CT abdomen with some distention. Resolved. Patient with no nausea or vomiting. Patient states having stool. No need for NG tube at this time. Patient tolerating current soft diet. Follow.   7. Chronic systolic heart failure/diastolic cardiomyopathy Complicated by cocaine use. Currently stable. Supportive care.  8. Cocaine abuse  Patient counseled onpolysubstancecessation.  9. Diabetes mellitus II Hemoglobin A1c 18.2 on 08/23/2018.was on metformin levemir and novolog at home continue ssi  Nutrition Problem: Severe Malnutrition Etiology: chronic illness(Pancreatic cancer, DM and CHF)     Signs/Symptoms: severe muscle depletion, severe fat depletion, percent weight loss Percent weight loss: 23 %    Interventions: Ensure Enlive (each supplement provides 350kcal and 20 grams of protein)  Estimated body mass index is 17.71 kg/m as calculated from the following:   Height as of this encounter: 5\' 11"  (1.803 m).   Weight as of this encounter: 57.6 kg.  DVT prophylaxis: scd Code Status: partial Family Communication: fiance in the room Disposition Plan:   Pending xrt Consultants:  onc  Procedures  Antimicrobials: none  Subjective:no complaints  Objective: Vitals:   10/08/18 0403 10/08/18 1157  10/08/18 2100 10/09/18 0140  BP: 119/85 (!) 115/91 (!) 127/93 (!) 119/96  Pulse: 82 78 84 83  Resp: 16 15 18 16   Temp: 99.5 F (37.5 C) 98.8 F (37.1 C) 99.8 F (37.7 C) 99.5 F (37.5 C)  TempSrc: Oral Oral Oral Oral  SpO2: 94% 100% 97% 100%  Weight:      Height:        Intake/Output Summary (Last 24 hours) at 10/09/2018 1412 Last data filed at 10/09/2018 0507 Gross per 24 hour  Intake 120 ml  Output 650 ml  Net -530 ml   Filed Weights   09/16/18 1325 09/28/18 1500 09/28/18 1914  Weight: 66.7 kg 59 kg 57.6 kg    Examination:  General exam: Appears calm and comfortable  Respiratory system: Clear to auscultation. Respiratory effort normal. Cardiovascular system: S1 & S2 heard, RRR. No JVD, murmurs, rubs, gallops or clicks. No pedal edema. Gastrointestinal system: Abdomen is nondistended, soft and nontender. No organomegaly or masses felt. Normal bowel sounds heard. Central nervous system: Alert and oriented. No focal neurological deficits. Extremities: Symmetric 5 x 5 power. Skin: No rashes, lesions or ulcers Psychiatry: Judgement and insight appear normal. Mood & affect appropriate.     Data Reviewed: I have personally reviewed following labs and imaging studies  CBC: Recent Labs  Lab 10/03/18 0904 10/04/18 0346 10/05/18 0343 10/06/18 0238 10/07/18 0340 10/08/18 0359 10/09/18 0306  WBC 12.3* 14.2* 13.7* 12.8* 10.4 10.8* 8.8  NEUTROABS 10.8* 12.2* 11.7* 10.9*  --   --   --   HGB 9.2* 8.9* 8.4* 8.6* 8.8* 8.4* 8.9*  HCT 28.4* 27.6* 26.3* 27.1* 27.7* 26.8* 28.4*  MCV 87.7 87.3 89.8 90.6 89.9 89.0 88.5  PLT 310 304 291 352 384 410* 235*   Basic Metabolic Panel: Recent  Labs  Lab 10/03/18 0904 10/04/18 0346 10/05/18 0343 10/06/18 0238 10/07/18 0340  NA 134* 135 134* 134* 134*  K 3.7 3.6 3.4* 3.8 3.6  CL 98 99 100 97* 97*  CO2 29 29 26 28 28   GLUCOSE 246* 201* 200* 183* 205*  BUN 11 9 7 8 8   CREATININE 9.32* 0.48* 0.56* 0.46* 0.51*  CALCIUM 7.0* 7.1*  6.7* 7.0* 7.1*   GFR: Estimated Creatinine Clearance: 83 mL/min (A) (by C-G formula based on SCr of 0.51 mg/dL (L)). Liver Function Tests: Recent Labs  Lab 10/07/18 0340  AST 16  ALT 17  ALKPHOS 68  BILITOT 0.5  PROT 4.8*  ALBUMIN 1.2*   No results for input(s): LIPASE, AMYLASE in the last 168 hours. No results for input(s): AMMONIA in the last 168 hours. Coagulation Profile: No results for input(s): INR, PROTIME in the last 168 hours. Cardiac Enzymes: No results for input(s): CKTOTAL, CKMB, CKMBINDEX, TROPONINI in the last 168 hours. BNP (last 3 results) No results for input(s): PROBNP in the last 8760 hours. HbA1C: No results for input(s): HGBA1C in the last 72 hours. CBG: Recent Labs  Lab 10/08/18 1703 10/08/18 2159 10/08/18 2300 10/09/18 0750 10/09/18 1153  GLUCAP 177* 163* 162* 176* 140*   Lipid Profile: No results for input(s): CHOL, HDL, LDLCALC, TRIG, CHOLHDL, LDLDIRECT in the last 72 hours. Thyroid Function Tests: No results for input(s): TSH, T4TOTAL, FREET4, T3FREE, THYROIDAB in the last 72 hours. Anemia Panel: No results for input(s): VITAMINB12, FOLATE, FERRITIN, TIBC, IRON, RETICCTPCT in the last 72 hours. Sepsis Labs: No results for input(s): PROCALCITON, LATICACIDVEN in the last 168 hours.  Recent Results (from the past 240 hour(s))  Culture, blood (Routine X 2) w Reflex to ID Panel     Status: None   Collection Time: 10/01/18  9:29 AM  Result Value Ref Range Status   Specimen Description   Final    BLOOD RIGHT HAND Performed at [facility] [address], [place], [place] [address]    Special Requests   Final    BOTTLES DRAWN AEROBIC ONLY Blood Culture results may not be optimal due to an inadequate volume of blood received in culture bottles Performed at [facility] [address], [place], [place] [address]    Culture   Final    NO GROWTH 5 DAYS Performed at [facility], [place] [address]  [place], [place] [address]    Report Status 10/06/2018 FINAL  Final  Culture, blood (Routine X 2) w Reflex to ID Panel     Status: None   Collection Time: 10/01/18  9:29 AM  Result Value Ref Range Status   Specimen Description BLOOD RIGHT ARM  Final   Special Requests   Final    BOTTLES DRAWN AEROBIC AND ANAEROBIC Blood Culture adequate volume Performed at [facility] [address] [place], [place] [address]    Culture   Final    NO GROWTH 5 DAYS Performed at [facility], [place] [address], [place], [place] [address]    Report Status 10/06/2018 FINAL  Final  Culture, Urine     Status: Abnormal   Collection Time: 10/01/18  2:12 PM  Result Value Ref Range Status   Specimen Description   Final    URINE, RANDOM Performed at [facility] [address], [place], [place] [address]    Special Requests   Final    NONE Performed at [facility], [place] [address] [place], [place] [address]    Culture >=100,000 COLONIES/mL PSEUDOMONAS AERUGINOSA (A)  Final   Report Status 10/03/2018 FINAL  Final   Organism ID, Bacteria PSEUDOMONAS AERUGINOSA (A)  Final      Susceptibility   Pseudomonas aeruginosa - MIC*    CEFTAZIDIME 4 SENSITIVE Sensitive     CIPROFLOXACIN <=0.25 SENSITIVE Sensitive     GENTAMICIN <=1 SENSITIVE Sensitive     IMIPENEM 1 SENSITIVE Sensitive     PIP/TAZO <=4 SENSITIVE Sensitive     CEFEPIME <=1 SENSITIVE Sensitive     * >=100,000 COLONIES/mL PSEUDOMONAS AERUGINOSA         Radiology Studies: No results found.      Scheduled Meds: . sodium chloride   Intravenous Once  . sodium chloride   Intravenous Once  . acetaminophen  325 mg Oral Once  . acetaminophen  500 mg Oral Once  . atorvastatin  20 mg Oral QHS  . carvedilol  3.125 mg Oral BID WC  . dronabinol  5 mg Oral BID AC  . feeding supplement (ENSURE ENLIVE)  237 mL Oral BID BM  . fluconazole  100 mg Oral Daily  . gabapentin  400 mg Oral TID PC  & HS  . guaiFENesin  1,200 mg Oral BID  . insulin aspart  0-9 Units Subcutaneous TID AC & HS  . lidocaine  15 mL Oral Once  . LORazepam  1 mg Intravenous Once  . mouth rinse  15 mL Mouth Rinse BID  . morphine  15 mg Oral Q12H  . pantoprazole  40 mg Oral BID  . senna-docusate  1 tablet Oral BID  . tamsulosin  0.4 mg Oral QHS   Continuous Infusions: . [START ON 10/11/2018]  ceFAZolin (ANCEF) IV       LOS: 36 days     Georgette Shell, MD [facility]  If 7PM-7AM, please contact night-coverage [email or web address] Password [email or web address] 10/09/2018, 2:12 PM

## 2018-10-09 NOTE — Progress Notes (Signed)
Ryan Medina is doing okay.  His radiation I think is about half over now.  His abdomen is nice and soft.  He is out of bed a little bit.  Hopefully, he will be able to be a little more mandatory.  I know that physical therapy is working with him really hard.  He is eating a little bit more.  He has a regular diet now.  There is no obvious bleeding.  His CBC today shows a white cell count of 8.8.  Hemoglobin 8.9.  Platelet count 462,000.  We will have to see what his iron levels look like.  I am sure his iron probably is still on the low side.  His pain control seems to be doing pretty well.  His pain really is localized to his right leg where he had the arterial embolus.  I think the Port-A-Cath is really putting early next week.  I think this will be a good idea as he will need systemic chemotherapy for this locally advanced pancreatic cancer.  He has had no fever.  On his physical exam, his temperature is 99.5.  Pulse 83.  Blood pressure 119/96.  His abdomen is soft.  Bowel sounds are present.  There is no obvious fluid wave.  There is no guarding or rebound tenderness.  Lungs are clear.  Cardiac exam regular rate and rhythm with no murmurs, rubs or bruits.  His extremities shows the surgical incision healing nicely.  He has decent temperature in his legs.  He does have a good pulse in both distal extremities.  Ryan Medina is a 58 year old African-American male with locally advanced pancreatic cancer.  He has GI bleeding because of pancreatic cancer erosion into the duodenum.  Radiation was started to try to minimize this.  I think that radiation is helping.  There is no bleeding for over a week now.  I appreciate everybody's help on 4 E.  The staff is doing a tremendous job with him.  [name], MD  Darlyn Chamber 29:11

## 2018-10-10 ENCOUNTER — Inpatient Hospital Stay (HOSPITAL_COMMUNITY): Payer: Medicaid Other

## 2018-10-10 LAB — GLUCOSE, CAPILLARY
GLUCOSE-CAPILLARY: 171 mg/dL — AB (ref 70–99)
Glucose-Capillary: 252 mg/dL — ABNORMAL HIGH (ref 70–99)
Glucose-Capillary: 69 mg/dL — ABNORMAL LOW (ref 70–99)
Glucose-Capillary: 68 mg/dL — ABNORMAL LOW (ref 70–99)
Glucose-Capillary: 278 mg/dL — ABNORMAL HIGH (ref 70–99)

## 2018-10-10 LAB — CBC
HCT: 26.7 % — ABNORMAL LOW (ref 39.0–52.0)
Hemoglobin: 8.3 g/dL — ABNORMAL LOW (ref 13.0–17.0)
MCH: 27.6 pg (ref 26.0–34.0)
MCHC: 31.1 g/dL (ref 30.0–36.0)
MCV: 88.7 fL (ref 80.0–100.0)
Platelets: 470 10*3/uL — ABNORMAL HIGH (ref 150–400)
RBC: 3.01 MIL/uL — AB (ref 4.22–5.81)
RDW: 13.6 % (ref 11.5–15.5)
WBC: 10 10*3/uL (ref 4.0–10.5)
nRBC: 0 % (ref 0.0–0.2)

## 2018-10-10 LAB — URINE CULTURE: Culture: NO GROWTH

## 2018-10-10 MED ORDER — INSULIN DETEMIR 100 UNIT/ML ~~LOC~~ SOLN
5.0000 [IU] | Freq: Every day | SUBCUTANEOUS | Status: DC
  Administered 2018-10-10 – 2018-10-15 (×4): 5 [IU] via SUBCUTANEOUS
  Filled 2018-10-10 (×5): qty 0.05

## 2018-10-10 NOTE — Progress Notes (Addendum)
PROGRESS NOTE    Ryan Medina  NWG:[id number] DOB: 1961/07/17 DOA: 09/03/2018 PCP: Patient, No Pcp Per   Brief Narrative: 21 yoM admitted with LLE pain found to have ischemic right leg and acute popliteal and tibial embolus s/p embolectomy 12/13. On heparin gtt. Developed acute RLL hematoma with compartment syndrome requiring fasciotomy 12/15. Decompensated 12/17 am with hypotension with abd distention and 2 massive bloody stools. Hgb drop 9.5 ->5.9. Getting 2 units PRBC, protonix, and GI consulted. Moved to ICU for hemodynamic instability. Had embolization of GDA 09/07/2018. Transferred out of ICU 12/18.  Transferred back to ICU 1/5 overnight for hypotension and recurrent anemia. Patient transfused a total of 22 units packed red blood cells. Patient seen by oncology and patient transferred to [facility] for radiation therapy to pancreatic mass.   Assessment & Plan:   Principal Problem:   Popliteal artery embolus (HCC) Active Problems:   DM (diabetes mellitus), type 2, uncontrolled ([place])   Essential hypertension   Dilated cardiomyopathy (HCC)   Chest pain   AAA (abdominal aortic aneurysm) (HCC)   Ischemic foot   Right leg claudication (HCC)   PVD (peripheral vascular disease) ([place])   Hemorrhagic shock (HCC)   Nontraumatic ischemic infarction of muscle of right lower leg   Upper GI bleed   Cocaine abuse ([place])   Acute blood loss anemia   Chronic pain syndrome   Duodenal ulcer with hemorrhage   Pancreatic cancer (HCC)   Goals of care, counseling/discussion   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 Noted to be nonresectable. Patient seen in consultation by oncology and patient transferred to [facility] long to start radiation treatment. Chemotherapy on hold for now per oncology. Per Oncology patient will likely need to stay in-house for entirety of his radiation treatment. Patient already started with radiation treatmentslast treatment is on the 22nd. IR to place port Monday  2. Acute hemorrhagic shock secondary to acute GI bleed Patient noted to be in acute hemorrhagic shock requiring pressors and octreotide which has since been discontinued. EGD from 09/09/2018 showed bleeding duodenal ulcer likely etiology of patient's bleed with concerns for malignancy from invasive pancreatic cancer despite negative biopsy results. Patient status post recurrent bleeding despite previous EGD/hemo-spray 09/07/2018. Patient status post GDA embolization 09/07/2018. Endoscopic ulcer appearance on 09/16/2018 showed deeply cratered, friable, intermittent oozing blood, firm. IR holding on repeat angiogram/embolization.. Systolic blood pressures in the low 100s. Patient currently asymptomatic. Patient with black/melanotic stools alternating with brown stools ongoing. Hemoglobin 8.8Patient status post 24 units packed red blood cells!!!Patient status post GDA embolization per IR 09/07/2018. Patient s/p IV iron per hematology/oncology. Octreotide has been discontinued. Continue PPI twice daily. Patient currently tolerating soft diet. GI has reviewed chart again and feel patient's hemoglobin drifting down and dark stools not a surprise and expect this to continue. GI recommending transfusion for hemoglobin less than 7.    3.  sepsis likely secondary to HCAP and pseudomonas aeruginosa UTIs/p vanco and cefepime.finished the course of cefepime.vanco stopped mrsa pcr negative.  4. Acute blood loss anemia Secondary to  problem #  2. Status post 24 units packed red blood cells. Hemoglobin was at 6.9 on 10/02/2018 patient transfused 2 units packed red blood cells hemoglobin currently at 8.4. Patient likely slowly losing blood from duodenal ulcer. Transfusion threshold hemoglobin less than 7. Follow H&H.  5. Extensive left popliteal artery embolus  status post embolectomy and fasciotomies of right lower extremity. Staples have been removed. Patient deemed not a candidate for anticoagulation due to GI bleed. Pain management. Per vascular surgery. PT following. Cam boot ordered for patient per PT.  6.?? Small bowel obstruction Gastric distention noted on CT abdomen with some distention. Resolved. Patient with no nausea or vomiting. Patient states having stool. No need for NG tube at this time. Patient tolerating current soft diet. Follow.   7. Chronic systolic heart failure/diastolic cardiomyopathy Complicated by cocaine use. Currently stable. Supportive care.  8. Cocaine abuse counseled on polysubstance cessation.  9 type 2 diabetes globin A1c was 18.2 on December 2019 patient takes metformin Levemir and NovoLog at home.  Will restart Levemir at a lower dose of 5 units nightly.      Nutrition Problem: Severe Malnutrition Etiology: chronic illness(Pancreatic cancer, DM and CHF)     Signs/Symptoms: severe muscle depletion, severe fat depletion, percent weight loss Percent weight loss: 23 %    Interventions: Ensure Enlive (each supplement provides 350kcal and 20 grams of protein)  Estimated body mass index is 17.71 kg/m as calculated from the following:   Height as of this encounter: 5\' 11"  (1.803 m).   Weight as of this encounter: 57.6 kg.  DVT prophylaxis: None due to recent GI bleed Code Status: Partial code Family Communication: Family in the room Disposition Plan: Pending XRT and port placement   Consultants: Oncology, vascular, GI, PCCM, interventional radiology,  palliative  Procedures: TTE 12/14 >EF 45-50%, G1DD. CTA PE 12/16 >No evidence of thoracic aortic aneurysm or dissection.Small bilateral effusions with associated bibasilarconsolidation. CTA A/P 12/21 >new pancreatic mass. Occluded GDA after coil embolization. Stable aneurysmal disease though has significant increase in mural thrombus within the dilated common iliac arteries bilaterally. MRI abd 12/22 > pancreatic neck mass c/w adenocarcinoma EUS 12/26 >pancreatic adenocarcinoma. Duodenal ulcer not c/w malignancy. TEE 12/26 > EF 50-55%, mild MR, no thrombus, negative bubble study. Renal US 1/2 > negative for hydro or acute process. CT pancreas 1/6 >Distention of gastric lumen to antrum, increase in size of pancreatic neck mass with involvement of superior mesenteric and celiac artery  12/13 LLE embolectomy  12/15 RLE fasciotomy  09/07/2018 intubation>> 09/07/2018 interventional radiology perform mesenteric angiography and cold embolization of the GDA.  Antimicrobials: None  Subjective: Resting in bed no new complaints Objective: Vitals:   10/09/18 2217 10/10/18 0157 10/10/18 0655 10/10/18 1005  BP: 116/83 (!) 120/94 128/82 117/85  Pulse: 85 88 86 90  Resp: 14 14 16 16   Temp: 99.3 F (37.4 C) 100.2 F (37.9 C) 99.7 F (37.6 C) 100.1 F (37.8 C)  TempSrc: Oral Oral Oral Oral  SpO2: 98% 98% 98% 100%  Weight:      Height:        Intake/Output Summary (Last 24 hours) at 10/10/2018 1216 Last data filed at 10/10/2018 0800 Gross per 24 hour  Intake 360 ml  Output 1375 ml  Net -1015 ml   Filed Weights   09/16/18 1325 09/28/18 1500 09/28/18 1914  Weight: 66.7 kg 59 kg 57.6 kg    Examination:  General exam: Appears calm and comfortable  Respiratory system: Clear to auscultation. Respiratory effort normal. Cardiovascular  system: S1 & S2 heard, RRR. No JVD, murmurs, rubs, gallops or clicks. No pedal edema. Gastrointestinal system: Abdomen is nondistended, soft and  nontender. No organomegaly or masses felt. Normal bowel sounds heard. Central nervous system: Alert and oriented. No focal neurological deficits. Extremities: Symmetric 5 x 5 power. Skin: No rashes, lesions or ulcers Psychiatry: Judgement and insight appear normal. Mood & affect appropriate.     Data Reviewed: I have personally reviewed following labs and imaging studies  CBC: Recent Labs  Lab 10/04/18 0346 10/05/18 0343 10/06/18 0238 10/07/18 0340 10/08/18 0359 10/09/18 0306 10/10/18 0332  WBC 14.2* 13.7* 12.8* 10.4 10.8* 8.8 10.0  NEUTROABS 12.2* 11.7* 10.9*  --   --   --   --   HGB 8.9* 8.4* 8.6* 8.8* 8.4* 8.9* 8.3*  HCT 27.6* 26.3* 27.1* 27.7* 26.8* 28.4* 26.7*  MCV 87.3 89.8 90.6 89.9 89.0 88.5 88.7  PLT 304 291 352 384 410* 462* 277*   Basic Metabolic Panel: Recent Labs  Lab 10/04/18 0346 10/05/18 0343 10/06/18 0238 10/07/18 0340  NA 135 134* 134* 134*  K 3.6 3.4* 3.8 3.6  CL 99 100 97* 97*  CO2 29 26 28 28   GLUCOSE 201* 200* 183* 205*  BUN 9 7 8 8   CREATININE 0.48* 0.56* 0.46* 0.51*  CALCIUM 7.1* 6.7* 7.0* 7.1*   GFR: Estimated Creatinine Clearance: 83 mL/min (A) (by C-G formula based on SCr of 0.51 mg/dL (L)). Liver Function Tests: Recent Labs  Lab 10/07/18 0340  AST 16  ALT 17  ALKPHOS 68  BILITOT 0.5  PROT 4.8*  ALBUMIN 1.2*   No results for input(s): LIPASE, AMYLASE in the last 168 hours. No results for input(s): AMMONIA in the last 168 hours. Coagulation Profile: No results for input(s): INR, PROTIME in the last 168 hours. Cardiac Enzymes: No results for input(s): CKTOTAL, CKMB, CKMBINDEX, TROPONINI in the last 168 hours. BNP (last 3 results) No results for input(s): PROBNP in the last 8760 hours. HbA1C: No results for input(s): HGBA1C in the last 72 hours. CBG: Recent Labs  Lab 10/09/18 0750 10/09/18 1153 10/09/18 1639 10/09/18 2214 10/10/18 0707  GLUCAP 176* 140* 217* 171* 252*   Lipid Profile: No results for input(s): CHOL,  HDL, LDLCALC, TRIG, CHOLHDL, LDLDIRECT in the last 72 hours. Thyroid Function Tests: No results for input(s): TSH, T4TOTAL, FREET4, T3FREE, THYROIDAB in the last 72 hours. Anemia Panel: No results for input(s): VITAMINB12, FOLATE, FERRITIN, TIBC, IRON, RETICCTPCT in the last 72 hours. Sepsis Labs: No results for input(s): PROCALCITON, LATICACIDVEN in the last 168 hours.  Recent Results (from the past 240 hour(s))  Culture, blood (Routine X 2) w Reflex to ID Panel     Status: None   Collection Time: 10/01/18  9:29 AM  Result Value Ref Range Status   Specimen Description   Final    BLOOD RIGHT HAND Performed at [facility] [address], [place], [place] [address]    Special Requests   Final    BOTTLES DRAWN AEROBIC ONLY Blood Culture results may not be optimal due to an inadequate volume of blood received in culture bottles Performed at [facility] [address], [place], [place] [address]    Culture   Final    NO GROWTH 5 DAYS Performed at [facility], [place] [address], [place], [place] [address]    Report Status 10/06/2018 FINAL  Final  Culture, blood (Routine X 2) w Reflex to ID Panel  Status: None   Collection Time: 10/01/18  9:29 AM  Result Value Ref Range Status   Specimen Description BLOOD RIGHT ARM  Final   Special Requests   Final    BOTTLES DRAWN AEROBIC AND ANAEROBIC Blood Culture adequate volume Performed at [facility] [address] [place], [place] [address]    Culture   Final    NO GROWTH 5 DAYS Performed at [facility], [place] [address] [place], [place] [address]    Report Status 10/06/2018 FINAL  Final  Culture, Urine     Status: Abnormal   Collection Time: 10/01/18  2:12 PM  Result Value Ref Range Status   Specimen Description   Final    URINE, RANDOM Performed at [facility] [address], [place], [place] [address]    Special Requests   Final     NONE Performed at [facility], [place] [address], [place], [place] [address]    Culture >=100,000 COLONIES/mL PSEUDOMONAS AERUGINOSA (A)  Final   Report Status 10/03/2018 FINAL  Final   Organism ID, Bacteria PSEUDOMONAS AERUGINOSA (A)  Final      Susceptibility   Pseudomonas aeruginosa - MIC*    CEFTAZIDIME 4 SENSITIVE Sensitive     CIPROFLOXACIN <=0.25 SENSITIVE Sensitive     GENTAMICIN <=1 SENSITIVE Sensitive     IMIPENEM 1 SENSITIVE Sensitive     PIP/TAZO <=4 SENSITIVE Sensitive     CEFEPIME <=1 SENSITIVE Sensitive     * >=100,000 COLONIES/mL PSEUDOMONAS AERUGINOSA  Urine Culture     Status: None   Collection Time: 10/08/18  9:30 PM  Result Value Ref Range Status   Specimen Description   Final    URINE, CLEAN CATCH Performed at [facility], [place] [address] [place], [place] [address]    Special Requests   Final    NONE Performed at [facility], [place] [address] [place], [place] [address]    Culture   Final    NO GROWTH Performed at [facility], [place] [address] [place], [place] [address]    Report Status 10/10/2018 FINAL  Final         Radiology Studies: No results found.      Scheduled Meds: . sodium chloride   Intravenous Once  . sodium chloride   Intravenous Once  . acetaminophen  325 mg Oral Once  . acetaminophen  500 mg Oral Once  . atorvastatin  20 mg Oral QHS  . carvedilol  3.125 mg Oral BID WC  . dronabinol  5 mg Oral BID AC  . feeding supplement (ENSURE ENLIVE)  237 mL Oral BID BM  . fluconazole  100 mg Oral Daily  . gabapentin  400 mg Oral TID PC & HS  . guaiFENesin  1,200 mg Oral BID  . insulin aspart  0-9 Units Subcutaneous TID AC & HS  . lidocaine  15 mL Oral Once  . LORazepam  1 mg Intravenous Once  . mouth rinse  15 mL Mouth Rinse BID  . morphine  15 mg Oral Q12H  . pantoprazole  40 mg Oral BID  . senna-docusate  1 tablet Oral BID  . tamsulosin  0.4 mg Oral QHS   Continuous  Infusions: . [START ON 10/11/2018]  ceFAZolin (ANCEF) IV       LOS: 37 days     Georgette Shell, MD [facility]  If 7PM-7AM, please contact night-coverage [email or web address] Password [facility] 10/10/2018,  12:16 PM

## 2018-10-10 NOTE — Progress Notes (Signed)
Pt temp noted to be 102.6.  MD notified, will continue to monitor.

## 2018-10-11 ENCOUNTER — Ambulatory Visit
Admit: 2018-10-11 | Discharge: 2018-10-11 | Disposition: A | Discharge status: Home / Self Care | Payer: Self-pay | Attending: Radiation Oncology | Admitting: Radiation Oncology

## 2018-10-11 LAB — CBC WITH DIFFERENTIAL/PLATELET
Abs Immature Granulocytes: 0.07 10*3/uL (ref 0.00–0.07)
Basophils Absolute: 0 10*3/uL (ref 0.0–0.1)
Basophils Relative: 0 %
Eosinophils Absolute: 0 10*3/uL (ref 0.0–0.5)
Eosinophils Relative: 0 %
HCT: 27.1 % — ABNORMAL LOW (ref 39.0–52.0)
Hemoglobin: 8.3 g/dL — ABNORMAL LOW (ref 13.0–17.0)
Immature Granulocytes: 1 %
Lymphocytes Relative: 8 %
Lymphs Abs: 1 10*3/uL (ref 0.7–4.0)
MCH: 27.6 pg (ref 26.0–34.0)
MCHC: 30.6 g/dL (ref 30.0–36.0)
MCV: 90 fL (ref 80.0–100.0)
Monocytes Absolute: 0.7 10*3/uL (ref 0.1–1.0)
Monocytes Relative: 6 %
Neutro Abs: 9.9 10*3/uL — ABNORMAL HIGH (ref 1.7–7.7)
Neutrophils Relative %: 85 %
Platelets: 451 10*3/uL — ABNORMAL HIGH (ref 150–400)
RBC: 3.01 MIL/uL — ABNORMAL LOW (ref 4.22–5.81)
RDW: 13.6 % (ref 11.5–15.5)
WBC: 11.7 10*3/uL — ABNORMAL HIGH (ref 4.0–10.5)
nRBC: 0 % (ref 0.0–0.2)

## 2018-10-11 LAB — GLUCOSE, CAPILLARY
GLUCOSE-CAPILLARY: 69 mg/dL — AB (ref 70–99)
Glucose-Capillary: 140 mg/dL — ABNORMAL HIGH (ref 70–99)
Glucose-Capillary: 71 mg/dL (ref 70–99)
Glucose-Capillary: 136 mg/dL — ABNORMAL HIGH (ref 70–99)
Glucose-Capillary: 149 mg/dL — ABNORMAL HIGH (ref 70–99)
Glucose-Capillary: 175 mg/dL — ABNORMAL HIGH (ref 70–99)

## 2018-10-11 LAB — URINALYSIS, ROUTINE W REFLEX MICROSCOPIC
Bacteria, UA: NONE SEEN
Bilirubin Urine: NEGATIVE
Glucose, UA: >=500 mg/dL — AB
Hgb urine dipstick: NEGATIVE
Ketones, ur: NEGATIVE mg/dL
Leukocytes, UA: NEGATIVE
Nitrite: NEGATIVE
Protein, ur: NEGATIVE mg/dL
Specific Gravity, Urine: 1.022 (ref 1.005–1.030)
pH: 6 (ref 5.0–8.0)

## 2018-10-11 LAB — BASIC METABOLIC PANEL
Anion gap: 8 (ref 5–15)
BUN: 10 mg/dL (ref 6–20)
CO2: 27 mmol/L (ref 22–32)
Calcium: 7.2 mg/dL — ABNORMAL LOW (ref 8.9–10.3)
Chloride: 100 mmol/L (ref 98–111)
Creatinine, Ser: 0.42 mg/dL — ABNORMAL LOW (ref 0.61–1.24)
GFR calc Af Amer: >60 mL/min (ref >60)
GFR calc non Af Amer: >60 mL/min (ref >60)
Glucose, Bld: 70 mg/dL (ref 70–99)
Potassium: 3.4 mmol/L — ABNORMAL LOW (ref 3.5–5.1)
Sodium: 135 mmol/L (ref 135–145)

## 2018-10-11 LAB — PROTIME-INR
INR: 1.15
Prothrombin Time: 14.6 seconds (ref 11.4–15.2)

## 2018-10-11 MED ORDER — SODIUM CHLORIDE 0.9 % IV SOLN
1.0000 g | Freq: Three times a day (TID) | INTRAVENOUS | Status: DC
  Administered 2018-10-11 – 2018-10-13 (×7): 1 g via INTRAVENOUS
  Filled 2018-10-11 (×9): qty 1

## 2018-10-11 MED ORDER — DEXTROSE 50 % IV SOLN
INTRAVENOUS | Status: AC
  Filled 2018-10-11: qty 50

## 2018-10-11 MED ORDER — VANCOMYCIN HCL IN DEXTROSE 750-5 MG/150ML-% IV SOLN
750.0000 mg | Freq: Two times a day (BID) | INTRAVENOUS | Status: DC
  Administered 2018-10-12 – 2018-10-13 (×4): 750 mg via INTRAVENOUS
  Filled 2018-10-11 (×5): qty 150

## 2018-10-11 MED ORDER — DEXTROSE 50 % IV SOLN
12.5000 g | INTRAVENOUS | Status: AC
  Administered 2018-10-11: 12.5 g via INTRAVENOUS

## 2018-10-11 MED ORDER — VANCOMYCIN HCL 10 G IV SOLR
1250.0000 mg | Freq: Once | INTRAVENOUS | Status: AC
  Administered 2018-10-11: 1250 mg via INTRAVENOUS
  Filled 2018-10-11: qty 1250

## 2018-10-11 NOTE — Progress Notes (Signed)
Patient ID: Ryan Medina, male   DOB: 12/12/1960, 58 y.o.   MRN: [id number]  This NP visited patient at the bedside as a follow up for palliative medicine needs and emotional support.  Patient significant other is at the bedside, Mr. Rawl is alert and oriented.    Patient reports that his pain is controlled with current medication regime.   Patient verbalizes an understanding of the seriousness of his disease but remains hopeful for improvement.     Created space and opportunity for Mr. Newmark to explore his thoughts and feelings about his current medical situation.  Emotional support offered        Questions and concerns addressed    Palliative medicine team will continue to support holistically.  Total time spent on the unit was 15  minutes  Greater than 50% of the time was spent in counseling and coordination of care  Wadie Lessen NP  Palliative Medicine Team Team Phone # [phone] Pager [phone]

## 2018-10-11 NOTE — Progress Notes (Signed)
Spoke to [facility] sw-CM informed her of needing resources for transp-transportation is a barrier @ d/c if patient is d/c to a homeless shelter-possibly [facility] where he was prior to admit to hospital. If oncologist Dr. Marin Olp could asst with this to have his doctor @ [place] resources would be provided through the resource from [facility] in [place]. CSW will f/u with patient on responsibility to ensure can return back to [facility] or asst with process for another homeless shelter. Patient currently is also followed by financial counselor for medicaid applic.

## 2018-10-11 NOTE — Progress Notes (Signed)
Physical Therapy Treatment Patient Details Name: Ryan Medina MRN: [id number] DOB: May 12, 1961 Today's Date: 10/11/2018    History of Present Illness 58 y.o. male with PMH including but not limited to DM and HTN who presented to the hospital on 12/13 with a right ischemic leg secondary to acute popliteal and tibial embolus, underwent embolectomy on 12/13 and subsequently was placed on a heparin drip.  He unfortunately developed a acute right lower leg hematoma with compartment syndrome requiring fasciotomy on 12/15.  Further hospital course was complicated by development of hemorrhagic shock with acute blood loss anemia secondary to a bleeding duodenal ulcer-requiring ICU transfer-intubation for airway protection and IR evaluation with mesenteric angiogram and GDA embolization.  Upon stability he was transferred back to the [facility] on 12/19. Pt is now s/p closure of R LE fasciotomy on 09/10/18. 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  Plans to transfer to [facility] for radiation treatment.     PT Comments    Mobility remains limited by R LE pain. Pt continues to utilize a NWB gait pattern despite being WBAT. He is only hopping for short distances currently.  Instructed pt to TRY to at least put some weight though the toes/forefoot each time he walks. Pt is unsure of d/c plan-CSW and CM in to speak with pt during session. Will continue to follow.      Follow Up Recommendations  SNF     Equipment Recommendations  Rolling walker with 5" wheels;3in1 (PT);Wheelchair;Wheelchair cushion     Recommendations for Other Services       Precautions / Restrictions Precautions Precautions: Fall Precaution Comments: pt is currently hopping on one leg due to R LE pain Other Brace: obtained a CAM boot for right  ankle hoping to tolerate  more WB, did not change the pain with dependency of right foot. Restrictions Weight Bearing Restrictions: No RLE Weight  Bearing: Weight bearing as tolerated    Mobility  Bed Mobility Overal bed mobility: Independent                Transfers Overall transfer level: Needs assistance Equipment used: Rolling walker (2 wheeled) Transfers: Sit to/from Stand Sit to Stand: Supervision         General transfer comment: for safety. Cues for hand/LE placement  Ambulation/Gait Ambulation/Gait assistance: Min guard Gait Distance (Feet): 8 Feet Assistive device: Rolling walker (2 wheeled) Gait Pattern/deviations: Step-to pattern     General Gait Details: Pt uses NWB gait pattern 2* pain. Cues for pt to try to put foot down (at least the toes) each time he gets up to ambulate. VCs safety, technique, sequence.    Stairs             Wheelchair Mobility    Modified Rankin (Stroke Patients Only)       Balance Overall balance assessment: Needs assistance         Standing balance support: Bilateral upper extremity supported Standing balance-Leahy Scale: Poor Standing balance comment: needs external support from RW and therapist, especially since he is electively keeping his R LE NWB.                             Cognition Arousal/Alertness: Awake/alert Behavior During Therapy: WFL for tasks assessed/performed Overall Cognitive Status: Within Functional Limits for tasks assessed  Exercises      General Comments        Pertinent Vitals/Pain Pain Assessment: 0-10 Pain Score: 8  Pain Location: R LE , increases with right leg dependent Pain Descriptors / Indicators: Grimacing;Guarding;Sore Pain Intervention(s): Limited activity within patient's tolerance;Repositioned    Home Living                      Prior Function            PT Goals (current goals can now be found in the care plan section) Progress towards PT goals: Not progressing toward goals - comment(pt remains limited by R LE pain)     Frequency    Min 2X/week      PT Plan Current plan remains appropriate    Co-evaluation              AM-PAC PT "6 Clicks" Mobility   Outcome Measure  Help needed turning from your back to your side while in a flat bed without using bedrails?: None Help needed moving from lying on your back to sitting on the side of a flat bed without using bedrails?: None Help needed moving to and from a bed to a chair (including a wheelchair)?: A Little Help needed standing up from a chair using your arms (e.g., wheelchair or bedside chair)?: A Little Help needed to walk in hospital room?: A Little Help needed climbing 3-5 steps with a railing? : A Little 6 Click Score: 20    End of Session   Activity Tolerance: Patient limited by pain Patient left: in bed;with call bell/phone within reach   PT Visit Diagnosis: Other abnormalities of gait and mobility (R26.89);Pain Pain - Right/Left: Right Pain - part of body: Leg     Time: 1206-1220 PT Time Calculation (min) (ACUTE ONLY): 14 min  Charges:  $Gait Training: 8-22 mins                        Weston Anna, PT Acute Rehabilitation Services Pager: [phone] Office: [phone]

## 2018-10-11 NOTE — Progress Notes (Signed)
Pharmacy Antibiotic Note  Ryan Medina is a 58 y.o. male admitted to [facility] on 09/03/2018 with ischemic right leg s/p embolectomy 12/13. Patient developed acute RLL hematoma with compartment syndrome requiring fasciotomy 12/15 with subsequent decompensation 12/17 moved to ICU for hemodynamic instability. Transferred out of ICU 12/18 and back on 1/5 for hypotension and recurrent anemia. Seen by oncology and transferred to [facility] for radiation therapy to pancreatic mass. Previously treated for PNA and pseudomonas UTI with cefepime and vancomycin 01/11 - 01/17 (vanc d/c'd on 01/13, cefepime d/c'd on 01/17). Pharmacy to resume vancomycin and cefepime on 01/20 due to fever from unknown source, awaiting cultures  10/11/18 - Tmax 102.6 - WBC trending up to 11.7 - Scr 0.42 - baseline 0.56  - BCx on 1/19 NGTD  Plan: Initiate cefepime 1g IV q8h Initiate vancomycin 1250 mg IV loading dose, followed by 750 mg IV q12h (est. AUC 495) Continue to monitor renal function, fever, WBC   Height: 5\' 11"  (180.3 cm) Weight: 126 lb 15.8 oz (57.6 kg) IBW/kg (Calculated) : 75.3  Temp (24hrs), Avg:100.5 F (38.1 C), Min:98 F (36.7 C), Max:102.6 F (39.2 C)  Recent Labs  Lab 10/05/18 0343 10/06/18 0238 10/07/18 0340 10/08/18 0359 10/09/18 0306 10/10/18 0332 10/11/18 0355  WBC 13.7* 12.8* 10.4 10.8* 8.8 10.0 11.7*  CREATININE 0.56* 0.46* 0.51*  --   --   --  0.42*    Estimated Creatinine Clearance: 83 mL/min (A) (by C-G formula based on SCr of 0.42 mg/dL (L)).    Allergies  Allergen Reactions  . Lisinopril Anaphylaxis    Antimicrobials this admission: 1/11 vancomycin>>1/13 1/11 cefepime>>1/17 1/15 fluconazole >> 1/20   Microbiology results: [facility] [address]: 12/20 MRSA PCR: negative 1/5 BCx: NGF 1/17 UCx: NGF 1/19 BCx: NGTD  WL: 1/10BCx: NGF 1/10UCx: PsA - pansensitive   Josefine Class, PharmD Candidate 10/11/2018 9:54 AM

## 2018-10-11 NOTE — Progress Notes (Signed)
Nutrition Follow-up  DOCUMENTATION CODES:   Severe malnutrition in context of chronic illness, Underweight  INTERVENTION:  - Continue Ensure Enlive BID. - Will order Magic Cup with dinner meals, this supplement provides 290 kcal and 9 grams of protein. - Continue to encourage PO intakes.  - Weigh patient today.   NUTRITION DIAGNOSIS:   Severe Malnutrition related to chronic illness(Pancreatic cancer, DM and CHF) as evidenced by severe muscle depletion, severe fat depletion, percent weight loss.-ongoing  GOAL:   Patient will meet greater than or equal to 90% of their needs -minimally met on average  MONITOR:   PO intake, Supplement acceptance, Weight trends, Labs  ASSESSMENT:   58 y.o male with PMH: recent dx pancreatic cancer, DM, CHF, crack cocaine use, emboli of legs, smoker. Pt admitted with admitted with DKA and GI bleed.   No weight recorded since 1/7. Per chart review, patient has been consuming 5%-100% of meals over the past 2 days. He has been accepting Ensure Enlive ~75% of the time that it is offered.   Per Dr. Rodena Piety' note yesterday afternoon: pancreatic adenocarcinoma with chemo currently on hold and likely to stay in the hospital for entirety of radiation treatments (to end 1/22), plan for port placement 1/20.    Medications reviewed; 5 mg marinol BID, sliding scale novolog, 5 units levemir/day, 1 tablet senokot BID. Labs reviewed; CBG: 140 mg/dl, K: 3.4 mmol/l, Ca: 7.2 mg/dl.    Diet Order:   Diet Order            Diet NPO time specified Except for: Sips with Meds  Diet effective midnight              EDUCATION NEEDS:   Not appropriate for education at this time  Skin:  Skin Assessment: Reviewed RN Assessment  Last BM:  1/19  Height:   Ht Readings from Last 1 Encounters:  09/28/18 5' 11"  (1.803 m)    Weight:   Wt Readings from Last 1 Encounters:  09/28/18 57.6 kg    Ideal Body Weight:  78 kg  BMI:  Body mass index is 17.71  kg/m.  Estimated Nutritional Needs:   Kcal:  2000-2200 kcal  Protein:  100-110 grams  Fluid:  >/= 2L/day     Jarome Matin, MS, RD, LDN, CNSC Inpatient Clinical Dietitian Pager # [phone] After hours/weekend pager # [phone]

## 2018-10-11 NOTE — Progress Notes (Signed)
Clinical Social Worker met patient at bedside with RNCM present. CSW went over discharge plan with patient. Patient stated he has been homeless with [employer] due to losing his job. Patient stated he has siblings in the area but stated he is unable to stay with them due to family not being financially stable. CSW and RNCM stated to patient that the hospital does not have resources to place patient into permanent housing and would be unable to place patient once discharge.Patient is aware that if family is unable to step up for support that a homeless shelter would be his only option. RNCM stated she will reach out to cancer center to find out more resources and will follow up with patient if she is able to find anything.   Rhea Pink, MSW,  [place]

## 2018-10-11 NOTE — Progress Notes (Signed)
PROGRESS NOTE    Ryan Medina  GHW:[id number] DOB: March 23, 1961 DOA: 09/03/2018 PCP: Patient, No Pcp Per  Brief Narrative:57 yoM admitted with LLE pain found to have ischemic right leg and acute popliteal and tibial embolus s/p embolectomy 12/13. On heparin gtt. Developed acute RLL hematoma with compartment syndrome requiring fasciotomy 12/15. Decompensated 12/17 am with hypotension with abd distention and 2 massive bloody stools. Hgb drop 9.5 ->5.9. Getting 2 units PRBC, protonix, and GI consulted. Moved to ICU for hemodynamic instability. Had embolization of GDA 09/07/2018. Transferred out of ICU 12/18.  Transferred back to ICU 1/5 overnight for hypotension and recurrent anemia. Patient transfused a total of 22 units packed red blood cells. Patient seen by oncology and patient transferred to [facility] for radiation therapy to pancreatic mass.   Assessment & Plan:   Principal Problem:   Popliteal artery embolus (HCC) Active Problems:   DM (diabetes mellitus), type 2, uncontrolled ([place])   Essential hypertension   Dilated cardiomyopathy (HCC)   Chest pain   AAA (abdominal aortic aneurysm) (HCC)   Ischemic foot   Right leg claudication (HCC)   PVD (peripheral vascular disease) ([place])   Hemorrhagic shock (HCC)   Nontraumatic ischemic infarction of muscle of right lower leg   Upper GI bleed   Cocaine abuse ([place])   Acute blood loss anemia   Chronic pain syndrome   Duodenal ulcer with hemorrhage   Pancreatic cancer (HCC)   Goals of care, counseling/discussion   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 Noted to be nonresectable. Patient seen in consultation by oncology and patient [facility] to start radiation treatment. Chemotherapy on hold for now per oncology. Per Oncology patient will likely need to stay in-house for entirety of his radiation treatment. Patient already started with radiation treatmentslast treatment is on the 22nd.IR to place port Monday  2. Acute hemorrhagic shock secondary to acute GI bleed Patient noted to be in acute hemorrhagic shock requiring pressors and octreotide which has since been discontinued. EGD from 09/09/2018 showed bleeding duodenal ulcer likely etiology of patient's bleed with concerns for malignancy from invasive pancreatic cancer despite negative biopsy results. Patient status post recurrent bleeding despite previous EGD/hemo-spray 09/07/2018. Patient status post GDA embolization 09/07/2018. Endoscopic ulcer appearance on 09/16/2018 showed deeply cratered, friable, intermittent oozing blood, firm. IR holding on repeat angiogram/embolization.. Systolic blood pressures in the low 100s. Patient currently asymptomatic. Patient with black/melanotic stools alternating with brown stools ongoing. Hemoglobin 8.8Patient status post 24 units packed red blood cells!!!Patient status post GDA embolization per IR 09/07/2018. Patient s/p IV iron per hematology/oncology. Octreotide has been discontinued. Continue PPI twice daily. Patient currently tolerating soft diet. GI has reviewed chart again and feel patient's hemoglobin drifting down and dark stools not a surprise and expect this to continue. GI recommending transfusion for hemoglobin less than 7.   3.  sepsis likely secondary to HCAP and pseudomonas aeruginosa UTIs/p vanco and cefepime.finished the course of cefepime.vanco stopped mrsa pcr negative.     4. Acute blood loss  anemia Secondary to problem #  2. Status post 24 units packed red blood cells. Hemoglobin was at 6.9 on 10/02/2018 patient transfused 2 units packed red blood cells hemoglobin currently at 8.4. Patient likely slowly losing blood from duodenal ulcer. Transfusion threshold hemoglobin less than 7. Follow H&H.  5. Extensive left popliteal artery embolus  status post embolectomy and fasciotomies of right lower extremity. Staples have been removed. Patient deemed not a candidate for anticoagulation due to GI bleed. Pain management. Per vascular surgery. PT following. Cam boot ordered for patient per PT.  6.?? Small bowel obstruction Gastric distention noted on CT abdomen with some distention. Resolved. Patient with no nausea or vomiting. Patient states having stool. No need for NG tube at this time. Patient tolerating current soft diet. Follow.   7. Chronic systolic heart failure/diastolic cardiomyopathy Complicated by cocaine use. Currently stable. Supportive care.  8. Cocaine abuse counseled on polysubstance cessation.  9 type 2 diabetes globin A1c was 18.2 on December 2019 patient takes metformin Levemir and NovoLog at home.  Will restart Levemir at a lower dose of 5 units nightly.  10 fever no clear source of infection found yet blood cultures UA has been collected with no evidence of infection.  Patient has no symptoms of chest pain shortness of breath or cough nausea vomiting or diarrhea.  We will restart Vanco and cefepime empirically and follow-up cultures of the area if they are negative DC IV antibiotics I would think this fever could be from tumor fever.    Nutrition Problem: Severe Malnutrition Etiology: chronic illness(Pancreatic cancer, DM and CHF)     Signs/Symptoms: severe muscle depletion, severe fat depletion, percent weight loss Percent weight loss: 23 %    Interventions: Ensure Enlive (each supplement provides 350kcal and 20 grams of  protein)  Estimated body mass index is 16.82 kg/m as calculated from the following:   Height as of this encounter: 5\' 11"  (1.803 m).   Weight as of this encounter: 54.7 kg.  DVT prophylaxis: SCD due to GI bleed Code Status: Full code Family Communication: Has a lady friend is in the room Disposition Plan: Pending clinical improvement Consultants: Palliative care, interventional radiology, PCCM, vascular, GI, oncology   Procedures: TTE 12/14 >EF 45-50%, G1DD. CTA PE 12/16 >No evidence of thoracic aortic aneurysm or dissection.Small bilateral effusions with associated bibasilarconsolidation. CTA A/P 12/21 >new pancreatic mass. Occluded GDA after coil embolization. Stable aneurysmal disease though has significant increase in mural thrombus within the dilated common iliac arteries bilaterally. MRI abd 12/22 > pancreatic neck mass c/w adenocarcinoma EUS 12/26 >pancreatic adenocarcinoma. Duodenal ulcer not c/w malignancy. TEE 12/26 > EF 50-55%, mild MR, no thrombus, negative bubble study. Renal US 1/2 > negative for hydro or acute process. CT pancreas 1/6 >Distention of gastric lumen to antrum, increase in size of pancreatic neck mass with involvement of superior mesenteric and celiac artery  12/13 LLE embolectomy  12/15 RLE fasciotomy  09/07/2018 intubation>> 09/07/2018 interventional radiology perform mesenteric angiography and cold embolization of the GDA. Antimicrobials:  Vancomycin and cefepime restarted for fever of unknown etiology on Monday, 10/11/2018 Subjective:  Patient resting in bed he feels the same and he denies any nausea vomiting diarrhea abdominal pain chest pain shortness of breath.  No cough. Objective: Vitals:   10/11/18 0242 10/11/18 0600 10/11/18 0727 10/11/18 1057  BP: (!) 128/95 128/86  122/90  Pulse: 100 99  81  Resp: 18 18  16   Temp: 98 F (36.7 C) (!) 101.3 F (38.5 C) 100.3 F (37.9 C) 99.3 F (37.4  C)  TempSrc:  Oral Oral Oral  SpO2: 100%  97%  100%  Weight:    54.7 kg  Height:        Intake/Output Summary (Last 24 hours) at 10/11/2018 1139 Last data filed at 10/11/2018 0600 Gross per 24 hour  Intake 1160 ml  Output 1225 ml  Net -65 ml   Filed Weights   09/28/18 1500 09/28/18 1914 10/11/18 1057  Weight: 59 kg 57.6 kg 54.7 kg    Examination:  General exam: Appears calm and comfortable  Respiratory system: Clear to auscultation. Respiratory effort normal. Cardiovascular system: S1 & S2 heard, RRR. No JVD, murmurs, rubs, gallops or clicks. No pedal edema. Gastrointestinal system: Abdomen is nondistended, soft and nontender. No organomegaly or masses felt. Normal bowel sounds heard. Central nervous system: Alert and oriented. No focal neurological deficits. Extremities: Symmetric 5 x 5 power. Skin: No rashes, lesions or ulcers Psychiatry: Judgement and insight appear normal. Mood & affect appropriate.     Data Reviewed: I have personally reviewed following labs and imaging studies  CBC: Recent Labs  Lab 10/05/18 0343 10/06/18 0238 10/07/18 0340 10/08/18 0359 10/09/18 0306 10/10/18 0332 10/11/18 0355  WBC 13.7* 12.8* 10.4 10.8* 8.8 10.0 11.7*  NEUTROABS 11.7* 10.9*  --   --   --   --  9.9*  HGB 8.4* 8.6* 8.8* 8.4* 8.9* 8.3* 8.3*  HCT 26.3* 27.1* 27.7* 26.8* 28.4* 26.7* 27.1*  MCV 89.8 90.6 89.9 89.0 88.5 88.7 90.0  PLT 291 352 384 410* 462* 470* 570*   Basic Metabolic Panel: Recent Labs  Lab 10/05/18 0343 10/06/18 0238 10/07/18 0340 10/11/18 0355  NA 134* 134* 134* 135  K 3.4* 3.8 3.6 3.4*  CL 100 97* 97* 100  CO2 26 28 28 27   GLUCOSE 200* 183* 205* 70  BUN 7 8 8 10   CREATININE 0.56* 0.46* 0.51* 0.42*  CALCIUM 6.7* 7.0* 7.1* 7.2*   GFR: Estimated Creatinine Clearance: 78.8 mL/min (A) (by C-G formula based on SCr of 0.42 mg/dL (L)). Liver Function Tests: Recent Labs  Lab 10/07/18 0340  AST 16  ALT 17  ALKPHOS 68  BILITOT 0.5  PROT 4.8*  ALBUMIN 1.2*   No results for input(s):  LIPASE, AMYLASE in the last 168 hours. No results for input(s): AMMONIA in the last 168 hours. Coagulation Profile: Recent Labs  Lab 10/11/18 0355  INR 1.15   Cardiac Enzymes: No results for input(s): CKTOTAL, CKMB, CKMBINDEX, TROPONINI in the last 168 hours. BNP (last 3 results) No results for input(s): PROBNP in the last 8760 hours. HbA1C: No results for input(s): HGBA1C in the last 72 hours. CBG: Recent Labs  Lab 10/10/18 1343 10/10/18 1635 10/10/18 2111 10/11/18 0725 10/11/18 1128  GLUCAP 68* 171* 278* 140* 71   Lipid Profile: No results for input(s): CHOL, HDL, LDLCALC, TRIG, CHOLHDL, LDLDIRECT in the last 72 hours. Thyroid Function Tests: No results for input(s): TSH, T4TOTAL, FREET4, T3FREE, THYROIDAB in the last 72 hours. Anemia Panel: No results for input(s): VITAMINB12, FOLATE, FERRITIN, TIBC, IRON, RETICCTPCT in the last 72 hours. Sepsis Labs: No results for input(s): PROCALCITON, LATICACIDVEN in the last 168 hours.  Recent Results (from the past 240 hour(s))  Culture, Urine     Status: Abnormal   Collection Time: 10/01/18  2:12 PM  Result Value Ref Range Status   Specimen Description   Final    URINE, RANDOM Performed at [facility] [address], [place], [place] [address]    Special Requests  Final    NONE Performed at [facility], [place] [address], [place], [place] [address]    Culture >=100,000 COLONIES/mL PSEUDOMONAS AERUGINOSA (A)  Final   Report Status 10/03/2018 FINAL  Final   Organism ID, Bacteria PSEUDOMONAS AERUGINOSA (A)  Final      Susceptibility   Pseudomonas aeruginosa - MIC*    CEFTAZIDIME 4 SENSITIVE Sensitive     CIPROFLOXACIN <=0.25 SENSITIVE Sensitive     GENTAMICIN <=1 SENSITIVE Sensitive     IMIPENEM 1 SENSITIVE Sensitive     PIP/TAZO <=4 SENSITIVE Sensitive     CEFEPIME <=1 SENSITIVE Sensitive     * >=100,000 COLONIES/mL PSEUDOMONAS AERUGINOSA  Urine Culture     Status: None    Collection Time: 10/08/18  9:30 PM  Result Value Ref Range Status   Specimen Description   Final    URINE, CLEAN CATCH Performed at [facility], [place] [address], [place], [place] [address]    Special Requests   Final    NONE Performed at [facility], [place] [address] [place], [place] [address]    Culture   Final    NO GROWTH Performed at [facility], [place] [address] [place], [place] [address]    Report Status 10/10/2018 FINAL  Final  Culture, blood (routine x 2)     Status: None (Preliminary result)   Collection Time: 10/10/18  5:13 PM  Result Value Ref Range Status   Specimen Description   Final    BLOOD LEFT ANTECUBITAL Performed at [facility] [address], [place], [place] [address]    Special Requests   Final    BOTTLES DRAWN AEROBIC AND ANAEROBIC Blood Culture adequate volume Performed at [facility] [address], [place], [place] [address]    Culture   Final    NO GROWTH < 12 HOURS Performed at [facility] [address] [place], [place] [address]    Report Status PENDING  Incomplete  Culture, blood (routine x 2)     Status: None (Preliminary result)   Collection Time: 10/10/18  5:16 PM  Result Value Ref Range Status   Specimen Description   Final    BLOOD BLOOD LEFT ARM Performed at [facility] [address] [place], [place] [address]    Special Requests   Final    BOTTLES DRAWN AEROBIC ONLY Blood Culture adequate volume Performed at [facility] [address], [place], [place] [address]    Culture   Final    NO GROWTH < 12 HOURS Performed at [facility] [address] [place],  [address]    Report Status PENDING  Incomplete         Radiology Studies: Dg Chest 1 View  Result Date: 10/10/2018 CLINICAL DATA:  Fever and weakness EXAM: CHEST  1 VIEW COMPARISON:  10/01/2018 FINDINGS: Cardiac shadow is  within normal limits. New right-sided PICC line is noted in the distal superior vena cava in satisfactory position. Some mild clearing in the left lung base is noted when compare with the prior exam. No bony abnormality is seen. IMPRESSION: Interval mild clearing of previously seen left basilar infiltrate. New right-sided PICC line in satisfactory position. Electronically Signed   By: Inez Catalina M.D.   On: 10/10/2018 18:45        Scheduled Meds: . sodium chloride   Intravenous Once  . sodium chloride   Intravenous  Once  . acetaminophen  325 mg Oral Once  . acetaminophen  500 mg Oral Once  . atorvastatin  20 mg Oral QHS  . carvedilol  3.125 mg Oral BID WC  . dronabinol  5 mg Oral BID AC  . feeding supplement (ENSURE ENLIVE)  237 mL Oral BID BM  . gabapentin  400 mg Oral TID PC & HS  . guaiFENesin  1,200 mg Oral BID  . insulin aspart  0-9 Units Subcutaneous TID AC & HS  . insulin detemir  5 Units Subcutaneous QHS  . lidocaine  15 mL Oral Once  . LORazepam  1 mg Intravenous Once  . mouth rinse  15 mL Mouth Rinse BID  . morphine  15 mg Oral Q12H  . pantoprazole  40 mg Oral BID  . senna-docusate  1 tablet Oral BID  . tamsulosin  0.4 mg Oral QHS   Continuous Infusions: .  ceFAZolin (ANCEF) IV    . ceFEPime (MAXIPIME) IV    . vancomycin 1,250 mg (10/11/18 1114)  . vancomycin       LOS: 38 days     Georgette Shell, MD [facility]  If 7PM-7AM, please contact night-coverage [email or web address] Password [facility] 10/11/2018, 11:39 AM

## 2018-10-11 NOTE — Progress Notes (Signed)
Pt's gf at bedside adamant about pt getting regular diet and double portions, per pt Dr. Marcelline Mates this last week. RN spoke to leadership on 4th floor, we will feed her dinner tonight but this will not continue tomorrow. Dr. Marin Olp will have to put order back in tomorrow if this will continue.

## 2018-10-11 NOTE — Progress Notes (Signed)
Hypoglycemic Event  CBG: 69  Treatment: D50 25 mL (12.5 gm)  Symptoms: None  Follow-up CBG: Time:1402 CBG Result:136  Possible Reasons for Event: Inadequate meal intake  Comments/MD notified: Pt NPO for port placement this afternoon    Ryan Medina K

## 2018-10-12 ENCOUNTER — Ambulatory Visit
Admit: 2018-10-12 | Discharge: 2018-10-12 | Disposition: A | Discharge status: Home / Self Care | Payer: Self-pay | Attending: Radiation Oncology | Admitting: Radiation Oncology

## 2018-10-12 ENCOUNTER — Ambulatory Visit: Payer: Self-pay

## 2018-10-12 ENCOUNTER — Inpatient Hospital Stay (HOSPITAL_COMMUNITY): Payer: Medicaid Other

## 2018-10-12 HISTORY — PX: IR IMAGING GUIDED PORT INSERTION: IMG5740

## 2018-10-12 LAB — CBC WITH DIFFERENTIAL/PLATELET
Abs Immature Granulocytes: 0.07 10*3/uL (ref 0.00–0.07)
Basophils Absolute: 0 10*3/uL (ref 0.0–0.1)
Basophils Relative: 0 %
EOS PCT: 1 %
Eosinophils Absolute: 0.1 10*3/uL (ref 0.0–0.5)
HCT: 27.1 % — ABNORMAL LOW (ref 39.0–52.0)
Hemoglobin: 8.4 g/dL — ABNORMAL LOW (ref 13.0–17.0)
Immature Granulocytes: 1 %
Lymphocytes Relative: 9 %
Lymphs Abs: 0.8 10*3/uL (ref 0.7–4.0)
MCH: 27.9 pg (ref 26.0–34.0)
MCHC: 31 g/dL (ref 30.0–36.0)
MCV: 90 fL (ref 80.0–100.0)
Monocytes Absolute: 0.7 10*3/uL (ref 0.1–1.0)
Monocytes Relative: 8 %
Neutro Abs: 7.3 10*3/uL (ref 1.7–7.7)
Neutrophils Relative %: 81 %
PLATELETS: 457 10*3/uL — AB (ref 150–400)
RBC: 3.01 MIL/uL — ABNORMAL LOW (ref 4.22–5.81)
RDW: 13.6 % (ref 11.5–15.5)
WBC: 8.9 10*3/uL (ref 4.0–10.5)
nRBC: 0 % (ref 0.0–0.2)

## 2018-10-12 LAB — GLUCOSE, CAPILLARY
Glucose-Capillary: 83 mg/dL (ref 70–99)
Glucose-Capillary: 150 mg/dL — ABNORMAL HIGH (ref 70–99)
Glucose-Capillary: 143 mg/dL — ABNORMAL HIGH (ref 70–99)
Glucose-Capillary: 235 mg/dL — ABNORMAL HIGH (ref 70–99)

## 2018-10-12 MED ORDER — ALTEPLASE 2 MG IJ SOLR
2.0000 mg | Freq: Once | INTRAMUSCULAR | Status: AC
  Administered 2018-10-12: 2 mg
  Filled 2018-10-12: qty 2

## 2018-10-12 MED ORDER — FENTANYL CITRATE (PF) 100 MCG/2ML IJ SOLN
INTRAMUSCULAR | Status: AC
  Filled 2018-10-12: qty 4

## 2018-10-12 MED ORDER — HEPARIN SOD (PORK) LOCK FLUSH 100 UNIT/ML IV SOLN
INTRAVENOUS | Status: AC
  Filled 2018-10-12: qty 5

## 2018-10-12 MED ORDER — LIDOCAINE HCL (PF) 1 % IJ SOLN
INTRAMUSCULAR | Status: AC | PRN
  Administered 2018-10-12 (×3): 10 mL

## 2018-10-12 MED ORDER — LIDOCAINE HCL 1 % IJ SOLN
INTRAMUSCULAR | Status: AC
  Filled 2018-10-12: qty 20

## 2018-10-12 MED ORDER — MIDAZOLAM HCL 2 MG/2ML IJ SOLN
INTRAMUSCULAR | Status: AC
  Filled 2018-10-12: qty 4

## 2018-10-12 MED ORDER — FENTANYL CITRATE (PF) 100 MCG/2ML IJ SOLN
INTRAMUSCULAR | Status: AC | PRN
  Administered 2018-10-12 (×2): 50 ug via INTRAVENOUS

## 2018-10-12 MED ORDER — MIDAZOLAM HCL 2 MG/2ML IJ SOLN
INTRAMUSCULAR | Status: AC | PRN
  Administered 2018-10-12 (×2): 1 mg via INTRAVENOUS

## 2018-10-12 NOTE — Progress Notes (Signed)
Occupational Therapy Treatment Patient Details Name: Ryan Medina MRN: [id number] DOB: 04/22/1961 Today's Date: 10/12/2018    History of present illness 58 y.o. male with PMH including but not limited to DM and HTN who presented to the hospital on 12/13 with a right ischemic leg secondary to acute popliteal and tibial embolus, underwent embolectomy on 12/13 and subsequently was placed on a heparin drip.  He unfortunately developed a acute right lower leg hematoma with compartment syndrome requiring fasciotomy on 12/15.  Further hospital course was complicated by development of hemorrhagic shock with acute blood loss anemia secondary to a bleeding duodenal ulcer-requiring ICU transfer-intubation for airway protection and IR evaluation with mesenteric angiogram and GDA embolization.  Upon stability he was transferred back to the [facility] on 12/19. Pt is now s/p closure of R LE fasciotomy on 09/10/18. 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  Plans to transfer to [facility] for radiation treatment.    OT comments  Pt continues to have limitations due to pain, requires increased encouragement to participate in therapy session but is ultimately agreeable. Pt participating in bil UE/LE AROM and resisted AROM in bil UEs for increased strengthening and endurance. Continued education provided on benefits of OOB activity with pt verbalizing understanding. Feel POC remains appropriate at this time. Will continue to follow acutely to progress pt towards established OT goals.   Follow Up Recommendations  SNF;Supervision/Assistance - 24 hour    Equipment Recommendations  Other (comment)(TBD in next venue)          Precautions / Restrictions Precautions Precautions: Fall Precaution Comments: pt is currently hopping on one leg due to R LE pain Other Brace: obtained a CAM boot for right  ankle hoping to tolerate  more WB, did not change the pain with dependency  of right foot. Restrictions Weight Bearing Restrictions: No RLE Weight Bearing: Weight bearing as tolerated       Mobility Bed Mobility Overal bed mobility: Independent                Transfers                      Balance                                           ADL either performed or assessed with clinical judgement   ADL Overall ADL's : Needs assistance/impaired                                       General ADL Comments: pt declined ADL completion during this session; agreeable to UB exercise at bed level for continued strengthening/endurance. educated on benefits of continued OOB and participation in activity      Vision       Perception     Praxis      Cognition Arousal/Alertness: Awake/alert Behavior During Therapy: WFL for tasks assessed/performed Overall Cognitive Status: Within Functional Limits for tasks assessed                                          Exercises Exercises: General Upper Extremity;Other exercises General Exercises - Upper Extremity  Shoulder Flexion: AROM;10 reps;Both;Supine Shoulder Horizontal ABduction: AROM;Both;10 reps;Supine Shoulder Horizontal ADduction: AROM;Both;10 reps General Exercises - Lower Extremity Ankle Circles/Pumps: AROM;5 reps;Both;Supine Other Exercises Other Exercises: chest press with resistance provided for increased strengthening; bil UE, x10 reps   Shoulder Instructions       General Comments      Pertinent Vitals/ Pain       Pain Assessment: Faces Faces Pain Scale: Hurts even more Pain Location: R LE  Pain Descriptors / Indicators: Grimacing;Guarding;Sore Pain Intervention(s): Monitored during session;Repositioned;Limited activity within patient's tolerance  Home Living                                          Prior Functioning/Environment              Frequency  Min 2X/week        Progress Toward  Goals  OT Goals(current goals can now be found in the care plan section)  Progress towards OT goals: OT to reassess next treatment(continues to have limitations due to pain)  Acute Rehab OT Goals Patient Stated Goal: decrease leg pain with mobility OT Goal Formulation: With patient Time For Goal Achievement: 10/26/18 Potential to Achieve Goals: Good  Plan Discharge plan remains appropriate    Co-evaluation                 AM-PAC OT "6 Clicks" Daily Activity     Outcome Measure   Help from another person eating meals?: None Help from another person taking care of personal grooming?: A Little Help from another person toileting, which includes using toliet, bedpan, or urinal?: A Little Help from another person bathing (including washing, rinsing, drying)?: A Little Help from another person to put on and taking off regular upper body clothing?: A Little Help from another person to put on and taking off regular lower body clothing?: A Little 6 Click Score: 19    End of Session    OT Visit Diagnosis: Unsteadiness on feet (R26.81);Other abnormalities of gait and mobility (R26.89);Muscle weakness (generalized) (M62.81);Pain Pain - Right/Left: Right Pain - part of body: Leg   Activity Tolerance Patient tolerated treatment well;Other (comment)(pt somewhat self limiting)   Patient Left in bed;with call bell/phone within reach   Nurse Communication Mobility status        Time: 1427-1440 OT Time Calculation (min): 13 min  Charges: OT General Charges $OT Visit: 1 Visit OT Treatments $Therapeutic Activity: 8-22 mins   Lou Cal, OT Supplemental Rehabilitation Services Pager [phone] Office [place] 10/12/2018, 3:15 PM

## 2018-10-12 NOTE — Care Management Note (Signed)
Case Management Note  Patient Details  Name: Ryan Medina MRN: [id number] Date of Birth: 01-02-1961  Subjective/Objective: Currently SNF may not be an option for d/c. Current d/c plan may be to homeless shelter.                   Action/Plan:d/c home less shelter   Expected Discharge Date:                  Expected Discharge Plan:  [place]  In-House Referral:  Clinical Social Work  Discharge planning Services  CM Consult  Post Acute Care Choice:  Durable Medical Equipment Choice offered to:  Patient  DME Arranged:    DME Agency:     HH Arranged:    [place] Agency:     Status of Service:  In process, will continue to follow  If discussed at Long Length of Stay Meetings, dates discussed:    Additional Comments:  [name], RN 10/12/2018, 2:26 PM

## 2018-10-12 NOTE — Sedation Documentation (Signed)
Patient is resting comfortably with eyes closed, snoring lightly, in NAD. 

## 2018-10-12 NOTE — Care Management Note (Signed)
Case Management Note  Patient Details  Name: Geoffry Bannister Jurewicz MRN: [id number] Date of Birth: Apr 18, 1961  Subjective/Objective: Popliteal artery embolus, New dx Pancreatic Ca, UTI,fevers-iv abx. Hx: CHF,DM. XRT to end tomorrow. From home less shelter-d/c plan SNF-LOG.                   Action/Plan:dc SNF.   Expected Discharge Date:                  Expected Discharge Plan:  Skilled Nursing Facility  In-House Referral:  Clinical Social Work  Discharge planning Services  CM Consult  Post Acute Care Choice:  Durable Medical Equipment Choice offered to:  Patient  DME Arranged:    DME Agency:     HH Arranged:    [place] Agency:     Status of Service:  In process, will continue to follow  If discussed at Long Length of Stay Meetings, dates discussed:    Additional Comments:  [name], RN 10/12/2018, 12:00 PM

## 2018-10-12 NOTE — Progress Notes (Signed)
Clinical Social Worker following patient for support and discharge needs. CSW contacted [employer] to see if patient would be abel to return upon discharge. CSW spoke with [facility] intake worker. [name] stated that because patient has been out of the shelter for some time he would need to come between 9a.m- 2p.m to be assessed for admission. CSW will continue to follow patient for support.   Rhea Pink, MSW,  [place]

## 2018-10-12 NOTE — Progress Notes (Signed)
PROGRESS NOTE    Ryan Medina  GXQ:[id number] DOB: 08-14-1961 DOA: 09/03/2018 PCP: Patient, No Pcp Per    Brief Narrative:57 yoM admitted with LLE pain found to have ischemic right leg and acute popliteal and tibial embolus s/p embolectomy 12/13. On heparin gtt. Developed acute RLL hematoma with compartment syndrome requiring fasciotomy 12/15. Decompensated 12/17 am with hypotension with abd distention and 2 massive bloody stools. Hgb drop 9.5 ->5.9. Getting 2 units PRBC, protonix, and GI consulted. Moved to ICU for hemodynamic instability. Had embolization of GDA 09/07/2018. Transferred out of ICU 12/18.  Transferred back to ICU 1/5 overnight for hypotension and recurrent anemia. Patient transfused a total of 22 units packed red blood cells. Patient seen by oncology and patient transferred to [facility] for radiation therapy to pancreatic mass.    Assessment & Plan:   Principal Problem:   Popliteal artery embolus (HCC) Active Problems:   DM (diabetes mellitus), type 2, uncontrolled ([place])   Essential hypertension   Dilated cardiomyopathy (HCC)   Chest pain   AAA (abdominal aortic aneurysm) (HCC)   Ischemic foot   Right leg claudication (HCC)   PVD (peripheral vascular disease) ([place])   Hemorrhagic shock (HCC)   Nontraumatic ischemic infarction of muscle of right lower leg   Upper GI bleed   Cocaine abuse ([place])   Acute blood loss anemia   Chronic pain syndrome   Duodenal ulcer with hemorrhage   Pancreatic cancer (HCC)   Goals of care, counseling/discussion   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 Noted to be nonresectable. Patient seen in consultation by oncology and patient transferred to [facility] to start radiation treatment. Chemotherapy on hold for now per oncology. Per Oncology patient will likely need to stay in-house for entirety of his radiation treatment. Patient already started with radiation treatmentslast treatment is on the 22nd.IR to place port 1/21  2. Acute hemorrhagic shock secondary to acute GI bleed Patient noted to be in acute hemorrhagic shock requiring pressors and octreotide which has since been discontinued. EGD from 09/09/2018 showed bleeding duodenal ulcer likely etiology of patient's bleed with concerns for malignancy from invasive pancreatic cancer despite negative biopsy results. Patient status post recurrent bleeding despite previous EGD/hemo-spray 09/07/2018. Patient status post GDA embolization 09/07/2018. Endoscopic ulcer appearance on 09/16/2018 showed deeply cratered, friable, intermittent oozing blood, firm. IR holding on repeat angiogram/embolization.. Systolic blood pressures in the low 100s. Patient currently asymptomatic. Patient with black/melanotic stools alternating with brown stools ongoing. Hemoglobin 8.8Patient status post 24 units packed red blood cells!!!Patient status post GDA embolization per IR 09/07/2018. Patient s/p IV iron per hematology/oncology. Octreotide has been discontinued. Continue PPI twice daily. Patient currently tolerating soft diet. GI has reviewed chart again and feel patient's hemoglobin drifting down and dark stools not a surprise and expect this to continue. GI recommending transfusion for hemoglobin less than 7.   3. s/p sepsis likely secondary to HCAP and pseudomonas aeruginosa UTIs/p vanco and cefepime.finished the course of cefepime.vanco stopped mrsa pcr negative.  Patient spiked high fever  10/10/2018-blood cultures urine culture and  chest x-ray was done.  None of them so far revealed any source of infection.  In fact chest is clear and looks much better than before.  He was restarted on Vanco and cefepime with a history of recent sepsis.  Follow cultures if nothing comes back positive I would think this is tumor fever and can DC antibiotics.    4. Acute blood loss anemia Secondary to problem #2. Status post 24 units packed red blood cells!!!!Hemoglobin was at 6.9 on 10/02/2018 patient transfused 2 units packed red blood cells hemoglobin currently at 8.4. Patient likely slowly losing blood from duodenal ulcer. Transfusion threshold hemoglobin less than 7. Follow H&H.  5. Extensive left popliteal artery embolus  status post embolectomy and fasciotomies of right lower extremity. Staples have been removed. Patient deemed not a candidate for anticoagulation due to GI bleed. Pain management. Per vascular surgery. PT following. Cam boot ordered for patient per PT.  6.?? Small bowel obstruction Gastric distention noted on CT abdomen with some distention. Resolved.  Patient is currently on regular diet.   7. Chronic systolic heart failure/diastolic cardiomyopathy Complicated by cocaine use. Currently stable. Supportive care.   8. Cocaine abusecounseled on polysubstance cessation.  9 type 2 diabetes globin A1c was 18.2 on December 2019 patient takes metformin Levemir and NovoLog at home. Will restart Levemir at a lower dose of 5 units nightly.   Nutrition Problem: Severe Malnutrition Etiology: chronic illness(Pancreatic cancer, DM and CHF)     Signs/Symptoms: severe muscle depletion, severe fat depletion, percent weight loss Percent weight loss: 23 %    Interventions: Ensure Enlive (each supplement provides 350kcal and 20 grams of protein)  Estimated body mass index is 16.82 kg/m as calculated from the following:   Height as of this encounter: 5\' 11"   (1.803 m).   Weight as of this encounter: 54.7 kg.  DVT prophylaxis: SCD Code Status: Full code Family Communication his girlfriend is in the room Disposition Plan: Plan is to discharge him back to shelter on the 23rd after finishing radiation on the 22nd.  Patient did have a port placed 10/12/2018.   Consultants: Oncology and interventional radiology  Procedures:TTE 12/14 >EF 45-50%, G1DD. CTA PE 12/16 >No evidence of thoracic aortic aneurysm or dissection.Small bilateral effusions with associated bibasilarconsolidation. CTA A/P 12/21 >new pancreatic mass. Occluded GDA after coil embolization. Stable aneurysmal disease though has significant increase in mural thrombus within the dilated common iliac arteries bilaterally. MRI abd 12/22 > pancreatic neck mass c/w adenocarcinoma EUS 12/26 >pancreatic adenocarcinoma. Duodenal ulcer not c/w malignancy. TEE 12/26 > EF 50-55%, mild MR, no thrombus, negative bubble study. Renal US 1/2 > negative for hydro or acute process. CT pancreas 1/6 >Distention of gastric lumen to antrum, increase in size of pancreatic neck mass with involvement of superior mesenteric and celiac artery  12/13 LLE embolectomy  12/15 RLE fasciotomy  09/07/2018 intubation>> 09/07/2018 interventional radiology perform mesenteric angiography and cold embolization of the GDA. Antimicrobials: Vancomycin and cefepime restarted 10/11/2018 for fever   Subjective:  He is resting in bed no new complaints no nausea vomiting diarrhea cough shortness of breath Objective: Vitals:   10/11/18 1057 10/11/18 1448 10/11/18 2054 10/12/18 0610  BP: 122/90 118/79 118/78 120/86  Pulse: 81 86 91 84  Resp: 16 18 18 18   Temp: 99.3 F (37.4 C) 99.5 F (37.5 C) 100.2 F (37.9 C) 99.4 F (37.4 C)  TempSrc: Oral Oral Oral Oral  SpO2: 100% 99% 96% 99%  Weight: 54.7 kg  Height:        Intake/Output Summary (Last 24 hours) at 10/12/2018 1139 Last data filed at 10/12/2018  0435 Gross per 24 hour  Intake 560 ml  Output 600 ml  Net -40 ml   Filed Weights   09/28/18 1500 09/28/18 1914 10/11/18 1057  Weight: 59 kg 57.6 kg 54.7 kg    Examination:  General exam: Appears calm and comfortable  Respiratory system: Clear to auscultation. Respiratory effort normal. Cardiovascular system: S1 & S2 heard, RRR. No JVD, murmurs, rubs, gallops or clicks. No pedal edema. Gastrointestinal system: Abdomen is nondistended, soft and nontender. No organomegaly or masses felt. Normal bowel sounds heard. Central nervous system: Alert and oriented. No focal neurological deficits. Extremities: Symmetric 5 x 5 power. Skin: No rashes, lesions or ulcers Psychiatry: Judgement and insight appear normal. Mood & affect appropriate.     Data Reviewed: I have personally reviewed following labs and imaging studies  CBC: Recent Labs  Lab 10/06/18 0238  10/08/18 0359 10/09/18 0306 10/10/18 0332 10/11/18 0355 10/12/18 0434  WBC 12.8*   < > 10.8* 8.8 10.0 11.7* 8.9  NEUTROABS 10.9*  --   --   --   --  9.9* 7.3  HGB 8.6*   < > 8.4* 8.9* 8.3* 8.3* 8.4*  HCT 27.1*   < > 26.8* 28.4* 26.7* 27.1* 27.1*  MCV 90.6   < > 89.0 88.5 88.7 90.0 90.0  PLT 352   < > 410* 462* 470* 451* 457*   < > = values in this interval not displayed.   Basic Metabolic Panel: Recent Labs  Lab 10/06/18 0238 10/07/18 0340 10/11/18 0355  NA 134* 134* 135  K 3.8 3.6 3.4*  CL 97* 97* 100  CO2 28 28 27   GLUCOSE 183* 205* 70  BUN 8 8 10   CREATININE 0.46* 0.51* 0.42*  CALCIUM 7.0* 7.1* 7.2*   GFR: Estimated Creatinine Clearance: 78.8 mL/min (A) (by C-G formula based on SCr of 0.42 mg/dL (L)). Liver Function Tests: Recent Labs  Lab 10/07/18 0340  AST 16  ALT 17  ALKPHOS 68  BILITOT 0.5  PROT 4.8*  ALBUMIN 1.2*   No results for input(s): LIPASE, AMYLASE in the last 168 hours. No results for input(s): AMMONIA in the last 168 hours. Coagulation Profile: Recent Labs  Lab 10/11/18 0355  INR  1.15   Cardiac Enzymes: No results for input(s): CKTOTAL, CKMB, CKMBINDEX, TROPONINI in the last 168 hours. BNP (last 3 results) No results for input(s): PROBNP in the last 8760 hours. HbA1C: No results for input(s): HGBA1C in the last 72 hours. CBG: Recent Labs  Lab 10/11/18 1319 10/11/18 1402 10/11/18 1700 10/11/18 2052 10/12/18 0738  GLUCAP 69* 136* 149* 175* 83   Lipid Profile: No results for input(s): CHOL, HDL, LDLCALC, TRIG, CHOLHDL, LDLDIRECT in the last 72 hours. Thyroid Function Tests: No results for input(s): TSH, T4TOTAL, FREET4, T3FREE, THYROIDAB in the last 72 hours. Anemia Panel: No results for input(s): VITAMINB12, FOLATE, FERRITIN, TIBC, IRON, RETICCTPCT in the last 72 hours. Sepsis Labs: No results for input(s): PROCALCITON, LATICACIDVEN in the last 168 hours.  Recent Results (from the past 240 hour(s))  Urine Culture     Status: None   Collection Time: 10/08/18  9:30 PM  Result Value Ref Range Status   Specimen Description   Final    URINE, CLEAN CATCH Performed at [facility], [place] [address] [place],  [address]    Special Requests   Final  NONE Performed at [facility], [place] [address] [place], [place] [address]    Culture   Final    NO GROWTH Performed at [facility], [place] [address], [place], [place] [address]    Report Status 10/10/2018 FINAL  Final  Culture, blood (routine x 2)     Status: None (Preliminary result)   Collection Time: 10/10/18  5:13 PM  Result Value Ref Range Status   Specimen Description   Final    BLOOD LEFT ANTECUBITAL Performed at [facility] [address] [place], [place] [address]    Special Requests   Final    BOTTLES DRAWN AEROBIC AND ANAEROBIC Blood Culture adequate volume Performed at [facility] [address] [place], [place] [address]    Culture   Final    NO GROWTH < 24 HOURS Performed at [facility] [address] [place], [place] [address]    Report Status PENDING  Incomplete  Culture, blood (routine x 2)     Status: None (Preliminary result)   Collection Time: 10/10/18  5:16 PM  Result Value Ref Range Status   Specimen Description   Final    BLOOD BLOOD LEFT ARM Performed at [facility] [address], [place], [place] [address]    Special Requests   Final    BOTTLES DRAWN AEROBIC ONLY Blood Culture adequate volume Performed at [facility] [address], [place], [place] [address]    Culture   Final    NO GROWTH < 24 HOURS Performed at [facility] [address], [place],  [address]    Report Status PENDING  Incomplete         Radiology Studies: Dg Chest 1 View  Result Date: 10/10/2018 CLINICAL DATA:  Fever and weakness EXAM: CHEST  1 VIEW COMPARISON:  10/01/2018 FINDINGS: Cardiac shadow is within normal limits. New right-sided PICC line is noted in the distal superior vena cava in satisfactory position. Some mild clearing in the left lung base is noted when compare with the prior exam. No bony abnormality is seen. IMPRESSION: Interval mild clearing of previously seen left basilar infiltrate. New right-sided PICC line in satisfactory position. Electronically Signed   By: Inez Catalina M.D.   On: 10/10/2018 18:45        Scheduled Meds: . sodium chloride   Intravenous Once  . sodium chloride   Intravenous Once  . acetaminophen  325 mg Oral Once  . acetaminophen  500 mg Oral Once  . atorvastatin  20 mg Oral QHS  . carvedilol  3.125 mg Oral BID WC  . dronabinol  5 mg Oral BID AC  . feeding supplement (ENSURE ENLIVE)  237 mL Oral BID BM  . gabapentin  400 mg Oral TID PC & HS  . guaiFENesin  1,200 mg Oral BID  . insulin aspart  0-9 Units Subcutaneous TID AC & HS  . insulin detemir  5 Units Subcutaneous QHS  . lidocaine  15 mL Oral Once  . LORazepam  1 mg Intravenous Once  . mouth rinse  15 mL Mouth Rinse BID   . morphine  15 mg Oral Q12H  . pantoprazole  40 mg Oral BID  . senna-docusate  1 tablet Oral BID  . tamsulosin  0.4 mg Oral QHS   Continuous Infusions: .  ceFAZolin (ANCEF) IV    . ceFEPime (MAXIPIME) IV 1 g (10/12/18 0333)  .  vancomycin 750 mg (10/12/18 0006)     LOS: 73 days     Georgette Shell, MD [facility]  If 7PM-7AM, please contact night-coverage [email or web address] Password [facility] 10/12/2018, 11:39 AM

## 2018-10-12 NOTE — Progress Notes (Signed)
Took over patient care form Vikki Ports. No new changes noted, will continue to F/U with plan of care.

## 2018-10-12 NOTE — Procedures (Signed)
Interventional Radiology Procedure Note  Procedure: Single Lumen Power Port Placement    Access:  Right IJ vein.  Findings: Catheter tip positioned at SVC/RA junction. Port is ready for immediate use.   Complications: None  EBL: < 10 mL  Recommendations:  - Ok to shower in 24 hours - Do not submerge for 7 days - Routine line care   [name] T. Mayan Dolney, M.D Pager:  [phone]   

## 2018-10-13 ENCOUNTER — Encounter (HOSPITAL_COMMUNITY): Payer: Self-pay | Admitting: Interventional Radiology

## 2018-10-13 ENCOUNTER — Ambulatory Visit
Admit: 2018-10-13 | Discharge: 2018-10-13 | Disposition: A | Discharge status: Home / Self Care | Payer: Self-pay | Attending: Radiation Oncology | Admitting: Radiation Oncology

## 2018-10-13 ENCOUNTER — Ambulatory Visit: Payer: Self-pay

## 2018-10-13 DIAGNOSIS — I739 Peripheral vascular disease, unspecified: Secondary | ICD-10-CM

## 2018-10-13 LAB — CBC
HEMATOCRIT: 25.2 % — AB (ref 39.0–52.0)
HEMOGLOBIN: 7.8 g/dL — AB (ref 13.0–17.0)
MCH: 28.2 pg (ref 26.0–34.0)
MCHC: 31 g/dL (ref 30.0–36.0)
MCV: 91 fL (ref 80.0–100.0)
Platelets: 437 10*3/uL — ABNORMAL HIGH (ref 150–400)
RBC: 2.77 MIL/uL — ABNORMAL LOW (ref 4.22–5.81)
RDW: 13.6 % (ref 11.5–15.5)
WBC: 7.3 10*3/uL (ref 4.0–10.5)
nRBC: 0 % (ref 0.0–0.2)

## 2018-10-13 LAB — BASIC METABOLIC PANEL
Anion gap: 8 (ref 5–15)
BUN: 12 mg/dL (ref 6–20)
CO2: 26 mmol/L (ref 22–32)
Calcium: 7.2 mg/dL — ABNORMAL LOW (ref 8.9–10.3)
Chloride: 102 mmol/L (ref 98–111)
Creatinine, Ser: 0.51 mg/dL — ABNORMAL LOW (ref 0.61–1.24)
GFR calc non Af Amer: >60 mL/min (ref >60)
Glucose, Bld: 102 mg/dL — ABNORMAL HIGH (ref 70–99)
Potassium: 3.6 mmol/L (ref 3.5–5.1)
SODIUM: 136 mmol/L (ref 135–145)

## 2018-10-13 LAB — GLUCOSE, CAPILLARY
GLUCOSE-CAPILLARY: 144 mg/dL — AB (ref 70–99)
Glucose-Capillary: 93 mg/dL (ref 70–99)
Glucose-Capillary: 258 mg/dL — ABNORMAL HIGH (ref 70–99)
Glucose-Capillary: 157 mg/dL — ABNORMAL HIGH (ref 70–99)

## 2018-10-13 NOTE — Progress Notes (Signed)
PROGRESS NOTE  Sameul Tagle Kjos  PTW:[id number] DOB: July 31, 1961 DOA: 09/03/2018 PCP: Patient, No Pcp Per  Outpatient Specialists: Oncology, Dr. Marin Olp (new patient) Brief Narrative: Merrit Friesen Friley is a 58 y.o. male admitted with LLE pain found to have ischemic right leg and acute popliteal and tibial embolus s/p embolectomy 12/13. On heparin gtt. Developed acute RLL hematoma with compartment syndrome requiring fasciotomy 12/15. Decompensated 12/17 am with hypotension with abd distention and 2 massive bloody stools. Hgb drop 9.5 ->5.9. Getting 2 units PRBC, protonix, and GI consulted. Moved to ICU for hemodynamic instability. Had embolization of GDA 09/07/2018. Transferred out of ICU 12/18.  Transferred back to ICU 1/5 overnight for hypotension and recurrent anemia. Patient transfused a total of 22 units packed red blood cells. Patient seen by oncology and patient transferred to [facility] for radiation therapy to pancreatic mass.  Assessment & Plan: Principal Problem:   Popliteal artery embolus (HCC) Active Problems:   DM (diabetes mellitus), type 2, uncontrolled ([place])   Essential hypertension   Dilated cardiomyopathy (HCC)   Chest pain   AAA (abdominal aortic aneurysm) (HCC)   Ischemic foot   Right leg claudication (HCC)   PVD (peripheral vascular disease) ([place])   Hemorrhagic shock (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 Noted to be nonresectable. Patient seen in consultation by oncology and patient transferred to [facility] long to start radiation treatment.  - Dr. Marin Olp following patient, chemotherapy on hold for now per oncology, but had port placed 1/21. Recommended remaining in-house for entirety of his radiation treatment (last Tx today, 10/13/2018). Will need outpatient follow up at local [place] center which can arrange transportation for the patient.  Acute hemorrhagic shock secondary to acute GI bleed: Shock resolved, GI bleeding stable, improved, due to duodenal ulcer from pancreatic CA invasion. Underwent EGD/hemospray 09/07/2018, GDA embolization 09/07/2018. Has received, per report 24u PRBCs throughout hospitalization. - s/p IV iron per hematology/oncology.  -Octreotide has been discontinued. Continue PPI twice daily.  -GI feels ongoing slow bleeding is likely to continue, will need very close CBC monitoring as outpatient and transfusion for Hgb < 7g/dl. Reports no gross bleeding for several days.  Sepsis likely secondary to HCAP and pseudomonas aeruginosa UTIs/p vancomycin and cefepime   Recurrent fever 1/19: In the absence of leukocytosis or localizing symptoms/signs. CXR improved aeration. Repeat urine culture negative.  - Monitor blood cultures. Suspect this could be due to malignancy. Will DC antibiotics.  Ischemic right foot due to right popliteal and tibial artery embolus: s/p embolectomy and bovine pericardial patch angioplasty of popliteal artery and posterior tibial artery 09/03/2018 by Dr. Scot Dock. Also had recurrent right tibial embolus in the setting of stopping anticoagulation due to significant GI bleeding s/p embolectomy 09/10/2018 at the time of fasciotomy closure.   Chronic systolic  heart failure/diastolic  cardiomyopathy: Complicated by cocaine use. Currently stable. Supportive care.  Cocaine use: +UDS in Dec 2019.  - CEssation counseling provided.    Possible SBO: Resolved.  Poorly-controlled T2DM: HbA1c 18.2% Dec 2019.  - Continue levemir and novolog.   Severe protein calorie malnutrition:  - Protein supplementation as able.  DVT prophylaxis: SCDs Code Status: Partial code (only desired intubation when last asked) Family Communication: Fiance at bedside Disposition Plan: [facility] 1/23  Consultants:   Oncology  IR  GI  Palliative care medicine team  PCCM  Vascular surgery  Procedures:   EGD 09/07/2018 by Dr. Rush Landmark:  Impression:       - No gross lesions in proximal esophagus. LA Grade                            D esophagitis in middle and distal esophagus..                           - Clotted blood in the entire stomach. J-shaped                            deformity in the gastric body.                           - One oozing duodenal ulcer with an adherent clot                            (Forrest Class IIb). There is no evidence of                            perforation. Attempts at removal of the clot were                            not successful. Without family around, and his                            clinical stability, decision made to not remove the                            clot because of concern for significant hemodynamic                            compromise. Hemospray was applied in effort to                            decrease the risk of persistent bleeding while                            awaiting next steps in evaluation/therapy as                            outlined below.                           - No gross lesions in the second portion of the  duodenum. Recommendation: The patient will be observed post-procedure,                            until all discharge criteria are met.                            - Return care to ICU.                           - Discussed case with ICU and with Interventional                            Radiology. Overall, high concern for risk of                            rebleeding, in setting of active oozing after just                            movement of small amount of clot at the base - thus                            consistent with still active bleeding. This is too                            large of an area for other endoscopic therapies                            other than use of Hemospray which was used today.                            Next step in evaluation should be a mesenteric                            angiogram and likely GDA embolization to decrease                            risk as much as possible of severe hemorrhage again.                           - Do not place NGT for the next few days due to                            severe esophagitis.                           - Continue IV PPI drip.                           - Send H. pylori serology and treat if positive.                           - The findings and recommendations were discussed  with the referring physician.  Upper EUS 09/16/2018 by Dr. Ardis Hughs:  Impression:       - 3.6cm by 2.1cm mass in the neck of pancreas that                            surrounds the celiac trunk, obstructs the main                            pancreatic duct and appears to have also invaded                            the duodenal bulb created a large, malignant,                            friable ulcer. I biopsied the abnormal, firm mucosa                            surrounding the duodenal ulcer and sampled the                            pancreatic mass with transgastric EUS FNA.                            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  Recommendation: Return patient to hospital [facility] for  ongoing care.                           - He has recieved 13 units of blood this admission,                            3 of them in the past 24 hours. He will continue to                            ooze from the duodenal ulcer that I suspect is                            malignant, related to the nearby large pancreatic                            mass. Judicious use of bloodthinners is strongly                            recommended.                           - Await final cytology/pathology reports; both                            hopefully available tomorrow.  Subjective: Pain is controlled, needs a wheelchair to get around. PT recommends SNF but no facility is willing to  take him. He completes radiation today, has port in place. Denies any reb lood per rectum or dark stools.  Objective: Vitals:   10/12/18 2323 10/13/18 0621 10/13/18 1401 10/13/18 1839  BP: (!) 124/92 114/90 (!) 129/91 122/90  Pulse: 95 87 86 90  Resp: 16 18 18 14   Temp: 100 F (37.8 C) 99.7 F (37.6 C) 100.2 F (37.9 C) (!) 100.7 F (38.2 C)  TempSrc: Oral Oral Oral Oral  SpO2: 99% 99% 100% 100%  Weight:      Height:        Intake/Output Summary (Last 24 hours) at 10/13/2018 1931 Last data filed at 10/13/2018 1559 Gross per 24 hour  Intake 320 ml  Output 725 ml  Net -405 ml   Filed Weights   09/28/18 1500 09/28/18 1914 10/11/18 1057  Weight: 59 kg 57.6 kg 54.7 kg   Gen: Thin male in no distress Pulm: Non-labored breathing. Clear to auscultation bilaterally.  CV: Regular rate and rhythm. No murmur, rub, or gallop. No JVD, no pedal edema. GI: Abdomen soft, non-tender, non-distended, with normoactive bowel sounds. No organomegaly or masses felt. Ext: Warm, right foot with palpable DP and PT pulses, fair cap refill. Wounds healed. Skin: Healed incisions, otherwise no rashes, lesions or ulcers. Port A Cath in place, nontedner, no surrounding erythema or discharge. RUE PICC in place, c/d/i. Neuro: Alert and  oriented. No focal neurological deficits. Psych: Judgement and insight appear normal. Mood & affect appropriate.   Data Reviewed: I have personally reviewed following labs and imaging studies  CBC: Recent Labs  Lab 10/09/18 0306 10/10/18 0332 10/11/18 0355 10/12/18 0434 10/13/18 1051  WBC 8.8 10.0 11.7* 8.9 7.3  NEUTROABS  --   --  9.9* 7.3  --   HGB 8.9* 8.3* 8.3* 8.4* 7.8*  HCT 28.4* 26.7* 27.1* 27.1* 25.2*  MCV 88.5 88.7 90.0 90.0 91.0  PLT 462* 470* 451* 457* 078*   Basic Metabolic Panel: Recent Labs  Lab 10/07/18 0340 10/11/18 0355 10/13/18 1051  NA 134* 135 136  K 3.6 3.4* 3.6  CL 97* 100 102  CO2 28 27 26   GLUCOSE 205* 70 102*  BUN 8 10 12   CREATININE 0.51* 0.42* 0.51*  CALCIUM 7.1* 7.2* 7.2*   GFR: Estimated Creatinine Clearance: 78.8 mL/min (A) (by C-G formula based on SCr of 0.51 mg/dL (L)). Liver Function Tests: Recent Labs  Lab 10/07/18 0340  AST 16  ALT 17  ALKPHOS 68  BILITOT 0.5  PROT 4.8*  ALBUMIN 1.2*   No results for input(s): LIPASE, AMYLASE in the last 168 hours. No results for input(s): AMMONIA in the last 168 hours. Coagulation Profile: Recent Labs  Lab 10/11/18 0355  INR 1.15   Cardiac Enzymes: No results for input(s): CKTOTAL, CKMB, CKMBINDEX, TROPONINI in the last 168 hours. BNP (last 3 results) No results for input(s): PROBNP in the last 8760 hours. HbA1C: No results for input(s): HGBA1C in the last 72 hours. CBG: Recent Labs  Lab 10/12/18 1756 10/12/18 2323 10/13/18 0731 10/13/18 1145 10/13/18 1652  GLUCAP 143* 235* 144* 93 258*   Lipid Profile: No results for input(s): CHOL, HDL, LDLCALC, TRIG, CHOLHDL, LDLDIRECT in the last 72 hours. Thyroid Function Tests: No results for input(s): TSH, T4TOTAL, FREET4, T3FREE, THYROIDAB in the last 72 hours. Anemia Panel: No results for input(s): VITAMINB12, FOLATE, FERRITIN, TIBC, IRON, RETICCTPCT in the last 72 hours. Urine analysis:    Component Value Date/Time    COLORURINE YELLOW 10/10/2018 [place]  10/10/2018 2303   LABSPEC 1.022 10/10/2018 2303   PHURINE 6.0 10/10/2018 2303   GLUCOSEU >=500 (A) 10/10/2018 2303   HGBUR NEGATIVE 10/10/2018 2303   BILIRUBINUR NEGATIVE 10/10/2018 2303   KETONESUR NEGATIVE 10/10/2018 2303   PROTEINUR NEGATIVE 10/10/2018 2303   UROBILINOGEN 4.0 (H) 12/05/2016 0921   NITRITE NEGATIVE 10/10/2018 2303   LEUKOCYTESUR NEGATIVE 10/10/2018 2303   Recent Results (from the past 240 hour(s))  Urine Culture     Status: None   Collection Time: 10/08/18  9:30 PM  Result Value Ref Range Status   Specimen Description   Final    URINE, CLEAN CATCH Performed at [facility], [place] [address] [place], [place] [address]    Special Requests   Final    NONE Performed at [facility], [place] [address], [place], [place] [address]    Culture   Final    NO GROWTH Performed at [facility], [place] [address], [place], [place] [address]    Report Status 10/10/2018 FINAL  Final  Culture, blood (routine x 2)     Status: None (Preliminary result)   Collection Time: 10/10/18  5:13 PM  Result Value Ref Range Status   Specimen Description   Final    BLOOD LEFT ANTECUBITAL Performed at [facility] [address] [place], [place] [address]    Special Requests   Final    BOTTLES DRAWN AEROBIC AND ANAEROBIC Blood Culture adequate volume Performed at [place] [facility] [address], [place], [place] [address]    Culture   Final    NO GROWTH 3 DAYS Performed at [facility], [place] [address] [place], [place] [address]    Report Status PENDING  Incomplete  Culture, blood (routine x 2)     Status: None (Preliminary result)   Collection Time: 10/10/18  5:16 PM  Result Value Ref Range Status   Specimen Description   Final    BLOOD BLOOD LEFT ARM Performed at [place] [address] [place],  [place] [address]     Special Requests   Final    BOTTLES DRAWN AEROBIC ONLY Blood Culture adequate volume Performed at [facility] [place] [address], [place], [place] [address]    Culture   Final    NO GROWTH 3 DAYS Performed at [facility], [place] [address], [place], [place] [address]    Report Status PENDING  Incomplete      Radiology Studies: Ir Imaging Guided Port Insertion  Result Date: 10/13/2018 CLINICAL DATA:  Pancreatic carcinoma and need for port placement prior to chemotherapy administration. EXAM: IMPLANTED PORT A CATH PLACEMENT WITH ULTRASOUND AND FLUOROSCOPIC GUIDANCE ANESTHESIA/SEDATION: 2.0 mg IV Versed; 100 mcg IV Fentanyl Total Moderate Sedation Time:  26 minutes The patient's level of consciousness and physiologic status were continuously monitored during the procedure by Radiology nursing. FLUOROSCOPY TIME:  12 seconds.  1.4 mGy. PROCEDURE: The procedure, risks, benefits, and alternatives were explained to the patient. Questions regarding the procedure were encouraged and answered. The patient understands and consents to the procedure. A time-out was performed prior to initiating the procedure. Ultrasound was utilized to confirm patency of the right internal jugular vein. The right neck and chest were prepped with chlorhexidine in a sterile fashion, and a sterile drape was applied covering the operative field. Maximum barrier sterile technique with sterile gowns and gloves were used for the procedure. Local anesthesia was provided with 1%  lidocaine. After creating a small venotomy incision, a 21 gauge needle was advanced into the right internal jugular vein under direct, real-time ultrasound guidance. Ultrasound image documentation was performed. After securing guidewire access, an 8 Fr dilator was placed. A J-wire was kinked to measure appropriate catheter length. A subcutaneous port pocket was then created along the upper chest wall utilizing sharp and blunt dissection. Portable  cautery was utilized. The pocket was irrigated with sterile saline. A single lumen power injectable port was chosen for placement. The 8 Fr catheter was tunneled from the port pocket site to the venotomy incision. The port was placed in the pocket. External catheter was trimmed to appropriate length based on guidewire measurement. At the venotomy, an 8 Fr peel-away sheath was placed over a guidewire. The catheter was then placed through the sheath and the sheath removed. Final catheter positioning was confirmed and documented with a fluoroscopic spot image. The port was accessed with a needle and aspirated and flushed with heparinized saline. The access needle was removed. The venotomy and port pocket incisions were closed with subcutaneous 3-0 Monocryl and subcuticular 4-0 Vicryl. Dermabond was applied to both incisions. COMPLICATIONS: COMPLICATIONS None FINDINGS: After catheter placement, the tip lies at the cavo-atrial junction. The catheter aspirates normally and is ready for immediate use. IMPRESSION: Placement of single lumen port a cath via right internal jugular vein. The catheter tip lies at the cavo-atrial junction. A power injectable port a cath was placed and is ready for immediate use. Electronically Signed   By: Aletta Edouard M.D.   On: 10/13/2018 08:21    Scheduled Meds: . sodium chloride   Intravenous Once  . sodium chloride   Intravenous Once  . acetaminophen  325 mg Oral Once  . acetaminophen  500 mg Oral Once  . atorvastatin  20 mg Oral QHS  . carvedilol  3.125 mg Oral BID WC  . dronabinol  5 mg Oral BID AC  . feeding supplement (ENSURE ENLIVE)  237 mL Oral BID BM  . gabapentin  400 mg Oral TID PC & HS  . guaiFENesin  1,200 mg Oral BID  . insulin aspart  0-9 Units Subcutaneous TID AC & HS  . insulin detemir  5 Units Subcutaneous QHS  . lidocaine  15 mL Oral Once  . LORazepam  1 mg Intravenous Once  . mouth rinse  15 mL Mouth Rinse BID  . morphine  15 mg Oral Q12H  .  pantoprazole  40 mg Oral BID  . senna-docusate  1 tablet Oral BID  . tamsulosin  0.4 mg Oral QHS   Continuous Infusions: . ceFEPime (MAXIPIME) IV 1 g (10/13/18 1205)  . vancomycin 750 mg (10/13/18 1006)     LOS: 40 days   Time spent: 25 minutes.  Patrecia Pour, MD [facility] [email or web address] Password [facility] 10/13/2018, 7:31 PM

## 2018-10-13 NOTE — Care Management Note (Signed)
Case Management Note  Patient Details  Name: Ryan Medina MRN: [id number] Date of Birth: 02-Apr-1961  Subjective/Objective: CM/CSW spoke to patient in rm about d/c plans-d/c to Homeless shelter-patient will f[facility]u [facility](informed it is his responsibility to go there for their assessment if they will allow him to stay)patient voiced understanding. He will also go to the [facility] during the day for medical attention,meds,& a warm place to stay. [facility] rep Ryan Medina for HHC-HHRN/PT/social worker, also to deliver a w/c to rm prior d/c. Patient has given his sister tel # as a Geographical information systems officer [phone] will asst w/meds through the [facility] program.                   Action/Plan:d/c to homeless shelter/HHC/dme   Expected Discharge Date:                  Expected Discharge Plan:  Homeless Shelter  In-House Referral:  Clinical Social Work  Discharge planning Services  CM Consult  Post Acute Care Choice:    Choice offered to:  Patient  DME Arranged:  Programmer, multimedia DME Agency:  [place]:  RN, PT, Social Work [employer] Agency:  [place]  Status of Service:  Completed, signed off  If discussed at [employer], dates discussed:    Additional Comments:  Ryan Phi, RN 10/13/2018, 3:14 PM

## 2018-10-13 NOTE — Evaluation (Addendum)
Occupational Therapy Treatment Patient Details Name: Ryan Medina MRN: [id number] DOB: [birth year] Today's Date: 10/13/2018    History of Present Illness 58 y.o. male with PMH including but not limited to DM and HTN who presented to the hospital on 12/13 with a right ischemic leg secondary to acute popliteal and tibial embolus, underwent embolectomy on 12/13 and subsequently was placed on a heparin drip.  He unfortunately developed a acute right lower leg hematoma with compartment syndrome requiring fasciotomy on 12/15.  Further hospital course was complicated by development of hemorrhagic shock with acute blood loss anemia secondary to a bleeding duodenal ulcer-requiring ICU transfer-intubation for airway protection and IR evaluation with mesenteric angiogram and GDA embolization.  Upon stability he was transferred back to the [facility] on 12/19. Pt is now s/p closure of R LE fasciotomy on 09/10/18. 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  Plans to transfer to [facility] for radiation treatment.    Clinical Impression   Pt admitted with new malignant ulcer. Pt currently with functional limitations due to the deficits listed below (see OT Problem List).  Pt will benefit from skilled OT to increase their safety and independence with ADL and functional mobility for ADL to facilitate discharge to venue listed below.      Follow Up Recommendations  SNF;Supervision/Assistance - 24 hour                 Precautions / Restrictions Precautions Precautions: Fall Restrictions Weight Bearing Restrictions: No RLE Weight Bearing: Weight bearing as tolerated      Mobility Bed Mobility Overal bed mobility: Independent                Transfers                 General transfer comment: refused        ADL either performed or assessed with clinical judgement   ADL Overall ADL's : Needs assistance/impaired Eating/Feeding:  Independent;Sitting   Grooming: Set up;Sitting                                 General ADL Comments: Pt refused to stand.  Pt did agree to sit EOB.  Pt is complaining of L sided pain.  Pt left sitting EOB with SW and CM present      Vision Patient Visual Report: No change from baseline              Pertinent Vitals/Pain Pain Score: 4  Pain Location: L side Pain Intervention(s): Monitored during session;Repositioned;Limited activity within patient's tolerance        Extremity/Trunk Assessment     Lower Extremity Assessment RLE Deficits / Details: pt with decreased strength and ROM limitations secnodary to post-op pain and weakness. Sensation decreased to light touch throughout       Communication     Cognition Arousal/Alertness: Awake/alert Behavior During Therapy: WFL for tasks assessed/performed Overall Cognitive Status: Within Functional Limits for tasks assessed                                     \                OT Treatment/Interventions: Self-care/ADL training;Therapeutic exercise;Energy conservation;DME and/or AE instruction;Therapeutic activities    OT Goals(Current goals can be found in the care plan  section) Acute Rehab OT Goals Patient Stated Goal: decrease leg pain with mobility OT Goal Formulation: With patient Time For Goal Achievement: 10/26/18 Potential to Achieve Goals: Good  OT Frequency:      AM-PAC OT "6 Clicks" Daily Activity     Outcome Measure Help from another person eating meals?: None Help from another person taking care of personal grooming?: A Little Help from another person toileting, which includes using toliet, bedpan, or urinal?: A Little Help from another person bathing (including washing, rinsing, drying)?: A Little Help from another person to put on and taking off regular upper body clothing?: A Little Help from another person to put on and taking off regular lower body clothing?: A Little 6  Click Score: 19   End of Session Nurse Communication: Other (comment)(SW / RN CM in room)  Activity Tolerance: Other (comment)(self limiting.) Patient left: Other (comment)(sitting EOB)  OT Visit Diagnosis: Unsteadiness on feet (R26.81);Other abnormalities of gait and mobility (R26.89);Muscle weakness (generalized) (M62.81);Pain                Time: [id number] OT Time Calculation (min): 23 min Charges:  1 [place] Kari Baars, [place] Acute Rehabilitation Services [phone] Office- [phone], Edwena Felty D 10/13/2018, 3:41 PM

## 2018-10-13 NOTE — Progress Notes (Signed)
PT Cancellation Note  Patient Details Name: Ryan Medina MRN: [id number] DOB: 16-Apr-1961   Cancelled Treatment:    Reason Eval/Treat Not Completed: Attempted PT tx session-pt declined to participate.    Weston Anna, PT Acute Rehabilitation Services Pager: [phone] Office: [phone]

## 2018-10-13 NOTE — Progress Notes (Signed)
Clinical Social Worker following patient for support and discharge needs. Patient and RNCM met patient and girlfriend at bedside. CSW spoke with patient and girlfriend in details about discharge plans. CSW stated that patient would need to be at [facility] between the 9 am-2 pm to be able to be assessed for a bed. CSW stated that because it is a shelter CSW would not be able to guarantee  placement. CSW stated she will try to get patient there as early as possible to give him better chance of attaining a bed. CSW made patient aware that because of the weather the [facility] will open there doors and patient can go there during the day. CSW informed patient that the cancer center will be able to assess patient for transportation needs. CSW will assist patient with transportation to the shelter upon discharge.   Rhea Pink, MSW,  [place]

## 2018-10-14 ENCOUNTER — Ambulatory Visit: Payer: Self-pay

## 2018-10-14 LAB — GLUCOSE, CAPILLARY
GLUCOSE-CAPILLARY: 180 mg/dL — AB (ref 70–99)
Glucose-Capillary: 155 mg/dL — ABNORMAL HIGH (ref 70–99)
Glucose-Capillary: 160 mg/dL — ABNORMAL HIGH (ref 70–99)

## 2018-10-14 LAB — HEMOGLOBIN AND HEMATOCRIT, BLOOD
HCT: 25.1 % — ABNORMAL LOW (ref 39.0–52.0)
Hemoglobin: 7.8 g/dL — ABNORMAL LOW (ref 13.0–17.0)

## 2018-10-14 LAB — CREATININE, SERUM
Creatinine, Ser: 0.39 mg/dL — ABNORMAL LOW (ref 0.61–1.24)
GFR calc Af Amer: >60 mL/min (ref >60)
GFR calc non Af Amer: >60 mL/min (ref >60)

## 2018-10-14 LAB — PREPARE RBC (CROSSMATCH)

## 2018-10-14 MED ORDER — FUROSEMIDE 10 MG/ML IJ SOLN: 20.0000 mg | Freq: Once | INTRAMUSCULAR | Status: DC

## 2018-10-14 MED ORDER — SODIUM CHLORIDE 0.9 % IV SOLN
510.0000 mg | Freq: Once | INTRAVENOUS | Status: AC
  Administered 2018-10-14: 510 mg via INTRAVENOUS
  Filled 2018-10-14: qty 17

## 2018-10-14 MED ORDER — SODIUM CHLORIDE 0.9% IV SOLUTION
Freq: Once | INTRAVENOUS | Status: AC
  Administered 2018-10-14: 10:00:00 via INTRAVENOUS

## 2018-10-14 NOTE — Progress Notes (Signed)
Ryan Medina finished his radiation therapy yesterday.  He did well with radiation treatments.  He has had no obvious bleeding.  However, his hemoglobin is 7.8.  He really needs to be transfused.  I will give him 2 units of blood.  We will give him another dose of iron.  I think that he will benefit from IV iron.  He is eating a little bit better.  He is more active with physical therapy.  He still has discomfort in the right leg.  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  He says that he is going to be discharged today.  I suspect that he will be going to housing provided by [employer].  He had a Port-A-Cath put in a couple days ago.  This will help him in the future.  Now that he is done with radiation, I would get him set up with chemotherapy.  I think this would be necessary.  He likely has micrometastatic disease from the pancreatic cancer.  The radiation that he had was strictly to try to help decrease his bleeding.  Now that we have done that, we can try to focus on treating the majority of the pancreatic cancer.  It sounds like he will not be able to make it out to my office.  It does not sound like he will be able to get transportation to come out to our office in [facility].  As such, we will have to see if 1 of the oncologist at the [place] [facility] will be able to take over his care.  He is having no problems with his bowels or bladder.  He says there is no melena.  His vital signs show a temperature of 99.2.  Pulse is 89.  Blood pressure 104/72.  His lungs are clear bilaterally.  Cardiac exam regular rate and rhythm with no murmurs, rubs or bruits.  Oral exam shows no mucositis.  Abdominal exam is soft.  He has decent bowel sounds.  There is no fluid wave.  He has no obvious abdominal mass.  There is no palpable liver or spleen tip.  Extremities shows the surgical wounds that are healing  nicely.  He has no swelling in the legs.  He has decent temperature in his legs.  Neurological exam is nonfocal.  Ryan Medina has locally advanced inoperable pancreatic cancer.  He has had radiation therapy to try to help decrease GI bleeding from invasion into the small bowel.  He now is ready for systemic chemotherapy.  This might be tricky as he is homeless.  I do not think that he would be a candidate for FOLFIRINOX chemotherapy as I do not think that it would be wise for him to go to his shelter with a chemotherapy pump attached.  As such, he probably would be a candidate for Abraxane/gemcitabine.  He will be transfused today.  I will also give him a dose of iron today.  I really think that he is done remarkably well considering all the problems that he had when he came in to the hospital.  I know that he has had outstanding care from all the staff on [address].  I know that everybody worked incredibly hard with him to try to get him better and to get him through the radiation and bouts of recurrent GI blood loss.   [name], MD  [place] 7:7

## 2018-10-14 NOTE — Progress Notes (Signed)
Patient has a order for Blood not IVIG, consult will be completed due to telephone conversation with the RN Abby

## 2018-10-14 NOTE — Progress Notes (Signed)
Clinical Social Worker following patient for support and discharge needs. CSW received phone call from Surveyor, quantity ([place]) Social Worker  stating that he does not want patient to be discharge. CSW made AD Social Worker aware that patient does not have any bed offer in the [place] area. AD Social Worker stated that he will look for facilities outside of [place]. CSW stated she is concerned about placement for patient because all of patient physicians are located in the [place] area and transportation is an issue. AD Social Worker stated that the cancer center should be able to transport patient even if placed out of [place] area.  CSW contacted Karle Starch [phone]) a member of the Medicaid Pending Team. Nevin Bloodgood stated that patients Medicaid Disability application has been turned in to worker [employer]. Nevin Bloodgood stated that if patient is approved they will reach back out to her to make her aware. Nevin Bloodgood stated it will take at least 2-3 months before patient is approved or denied. CSW will continue to follow for support and discharge needs.   Rhea Pink, MSW,  [place]

## 2018-10-14 NOTE — Progress Notes (Signed)
PROGRESS NOTE  Ryan Medina  MHD:[id number] DOB: Aug 13, 1961 DOA: 09/03/2018 PCP: Patient, No Pcp Per  Outpatient Specialists: Oncology, Dr. Marin Olp (new patient) Brief Narrative: Ryan Medina is a 58 y.o. male admitted with LLE pain found to have ischemic right leg and acute popliteal and tibial embolus s/p embolectomy 12/13. On heparin gtt. Developed acute RLL hematoma with compartment syndrome requiring fasciotomy 12/15. Decompensated 12/17 am with hypotension with abd distention and 2 massive bloody stools. Hgb drop 9.5 ->5.9. Getting 2 units PRBC, protonix, and GI consulted. Moved to ICU for hemodynamic instability. Had embolization of GDA 09/07/2018. Transferred out of ICU 12/18.  Transferred back to ICU 1/5 overnight for hypotension and recurrent anemia. He has been given a total of 26 units of PRBCs due to ongoing GI blood loss from duodenal ulceration from local invasion of pancreatic cancer. He was ultiamtely transferred to [facility] for initiation of radiation therapy. Hemoglobin has stabilized with resolution of clinical bleeding. He has since completed radiation treatments and had port inserted. His significant medical comorbidities are complicated by an adverse social situation, i.e. homelessness. With functional impairments in mobility, the safest place to discharge would be to a skilled nursing facility to improve strength to return to prior level of functioning. Unfortunately, due to cocaine positivity on admission he has been declined by local SNF's. The only other proposed disposition has been to a shelter which is not a stable enough environment for him in this current clinical state.  Assessment & Plan: Principal Problem:   Popliteal artery embolus (HCC) Active Problems:   DM (diabetes mellitus), type 2, uncontrolled ([place])   Essential hypertension   Dilated cardiomyopathy (HCC)   Chest pain   AAA (abdominal aortic aneurysm) (HCC)   Ischemic foot   Right leg  claudication (HCC)   PVD (peripheral vascular disease) ([place])   Hemorrhagic shock ([place])   Nontraumatic ischemic infarction of muscle of right lower leg   Upper GI bleed   Cocaine abuse ([place])   Acute blood loss anemia   Chronic pain syndrome   Duodenal ulcer with hemorrhage   Pancreatic cancer ([place])   Goals of care, counseling/discussion   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 Inoperable, but has undergone XRT.  - Dr. Marin Olp following patient, anticipating initiation of abraxane/gemcitabine as an outpatient. Had port placed 1/21. The patient has no transportation and would require assistance to present to frequent office visits.   Acute hemorrhagic shock secondary to acute GI bleed: Shock resolved, GI bleeding stable, improved, due to duodenal ulcer from pancreatic CA invasion. Underwent EGD/hemospray 09/07/2018, GDA embolization 09/07/2018, and underwent radiation therapy with the goal of stopping bleeding. Has received, per report 24, now 26u PRBCs throughout hospitalization. - s/p IV iron per hematology/oncology. Will repeat today and give 2u PRBCs per oncology. -Octreotide has been discontinued. Continue PPI twice daily.  -GI feels ongoing slow bleeding is likely to continue, will need very close CBC monitoring as outpatient.   Sepsis likely secondary to HCAP and pseudomonas aeruginosa UTIs/p vancomycin and cefepime   Recurrent fever 1/19: In the absence of leukocytosis or  localizing symptoms/signs. CXR improved aeration. Repeat urine culture  negative.  - Monitor blood cultures. Suspect this could be due to malignancy and/or ?XRT. Stopped antibiotics 1/22.  - If re-fevers, would recommend repeat blood cultures and monitoring. Would have higher threshold during transfusions.  Ischemic right foot due to right popliteal and tibial artery embolus: s/p embolectomy and bovine pericardial patch angioplasty of popliteal artery and posterior tibial artery 09/03/2018 by Dr. Scot Dock. Also had recurrent right tibial embolus in the setting of stopping anticoagulation due to significant GI bleeding s/p embolectomy 09/10/2018 at the time of fasciotomy closure. - Currently stable. Continue pain control as ordered. - Statin. Not on anticoagulation/antiplatelet due to GI bleeding.  Chronic combined HFrEF: - Continue coreg. This is a high risk medication when outpatient due to cocaine use.  - Agree with lasix coadministration with PRBCs. - Not on ACE/ARB due to hx anaphylaxis with lisinopril.  Cocaine use: +UDS in Dec 2019.  - Cessation counseling provided.    Possible SBO: Resolved.  Poorly-controlled T2DM: HbA1c 18.2% Dec 2019.  - Continue levemir and novolog. At inpatient goal.  Severe protein calorie malnutrition:  - Protein supplementation as able.  DVT prophylaxis: SCDs Code Status: Partial code (only desired intubation when last asked) Family Communication: Fiance at bedside Disposition Plan: Will cancel plans to discharge to [facility] 1/23. Will require supervised care.   Consultants:   Oncology  IR  GI  Palliative care medicine team  PCCM  Vascular surgery  Procedures:   EGD 09/07/2018 by Dr. Rush Landmark:  Impression:       - No gross lesions in proximal esophagus. LA Grade                            D esophagitis in middle and distal esophagus..                           - Clotted blood in the entire stomach. J-shaped                            deformity in the gastric body.                            - One oozing duodenal ulcer with an adherent clot                            ([place] Class IIb). There is no evidence of                            perforation. Attempts at removal of the clot were                            not successful. Without family around, and his                            clinical stability, decision made to not remove the                            clot because of concern for significant hemodynamic  compromise. Hemospray was applied in effort to                            decrease the risk of persistent bleeding while                            awaiting next steps in evaluation/therapy as                            outlined below.                           - No gross lesions in the second portion of the                            duodenum. Recommendation: The patient will be observed post-procedure,                            until all discharge criteria are met.                           - Return care to ICU.                           - Discussed case with ICU and with Interventional                            Radiology. Overall, high concern for risk of                            rebleeding, in setting of active oozing after just                            movement of small amount of clot at the base - thus                            consistent with still active bleeding. This is too                            large of an area for other endoscopic therapies                            other than use of Hemospray which was used today.                            Next step in evaluation should be a mesenteric                            angiogram and likely GDA embolization to decrease                            risk as much as possible of severe hemorrhage again.                           -  Do not place NGT for the next few days due to                            severe esophagitis.                           - Continue IV PPI drip.                            - Send H. pylori serology and treat if positive.                           - The findings and recommendations were discussed                            with the referring physician.  Upper EUS 09/16/2018 by Dr. Ardis Hughs:  Impression:       - 3.6cm by 2.1cm mass in the neck of pancreas that                            surrounds the celiac trunk, obstructs the main                            pancreatic duct and appears to have also invaded                            the duodenal bulb created a large, malignant,                            friable ulcer. I biopsied the abnormal, firm mucosa                            surrounding the duodenal ulcer and sampled the                            pancreatic mass with transgastric EUS FNA.                            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  Recommendation: Return patient to hospital [facility] for ongoing care.                           - He has recieved 13 units of blood this admission,                            3 of them in the past 24 hours. He will continue to                            ooze from the duodenal ulcer that I suspect is  malignant, related to the nearby large pancreatic                            mass. Judicious use of bloodthinners is strongly                            recommended.                           - Await final cytology/pathology reports; both                            hopefully available tomorrow.  Subjective: Pain in right leg is stable, improved with medications. Denies GI bleeding, abd pain. No dyspnea but feels weak when sitting up quickly.   Objective: Vitals:   10/13/18 1839 10/13/18 2224 10/14/18 0505 10/14/18 1253  BP: 122/90 (!) 120/92 104/72 124/88  Pulse: 90 87 89 83  Resp: _0 Temp: (!) 100.7 F (38.2 C) 99.7 F (37.6 C) 99.2 F (37.3 C) 98.8 F (37.1 C)  TempSrc:  Oral Oral Oral Oral  SpO2: 100% 100% 98% 98%  Weight:      Height:        Intake/Output Summary (Last 24 hours) at 10/14/2018 1701 Last data filed at 10/14/2018 1400 Gross per 24 hour  Intake 963.5 ml  Output 1100 ml  Net -136.5 ml   Filed Weights   09/28/18 1500 09/28/18 1914 10/11/18 1057  Weight: 59 kg 57.6 kg 54.7 kg   Gen: Malnourished, chronically ill-appearing male in no distress Pulm: Nonlabored breathing room air. Clear. CV: Regular rate and rhythm. No murmur, rub, or gallop. No JVD, no dependent edema. GI: Abdomen soft, non-tender, non-distended, with normoactive bowel sounds.  Ext: Warm, no deformities, Palpable DP pulses, incisions healed nicely Skin: No other rashes, lesions or ulcers on visualized skin. Neuro: Alert and oriented. No focal neurological deficits. Psych: Judgement and insight appear fair. Mood euthymic & affect congruent. Behavior is appropriate.    Data Reviewed: I have personally reviewed following labs and imaging studies  CBC: Recent Labs  Lab 10/09/18 0306 10/10/18 0332 10/11/18 0355 10/12/18 0434 10/13/18 1051 10/14/18 0321  WBC 8.8 10.0 11.7* 8.9 7.3  --   NEUTROABS  --   --  9.9* 7.3  --   --   HGB 8.9* 8.3* 8.3* 8.4* 7.8* 7.8*  HCT 28.4* 26.7* 27.1* 27.1* 25.2* 25.1*  MCV 88.5 88.7 90.0 90.0 91.0  --   PLT 462* 470* 451* 457* 437*  --    Basic Metabolic Panel: Recent Labs  Lab 10/11/18 0355 10/13/18 1051 10/14/18 0321  NA 135 136  --   K 3.4* 3.6  --   CL 100 102  --   CO2 27 26  --   GLUCOSE 70 102*  --   BUN 10 12  --   CREATININE 0.42* 0.51* 0.39*  CALCIUM 7.2* 7.2*  --    GFR: Estimated Creatinine Clearance: 78.8 mL/min (A) (by C-G formula based on SCr of 0.39 mg/dL (L)). Liver Function Tests: No results for input(s): AST, ALT, ALKPHOS, BILITOT, PROT, ALBUMIN in the last 168 hours. No results for input(s): LIPASE, AMYLASE in the last 168 hours. No results for input(s): AMMONIA in the last 168 hours. Coagulation  Profile: Recent Labs  Lab 10/11/18 0355  INR 1.15   Cardiac Enzymes: No results for input(s): CKTOTAL, CKMB, CKMBINDEX, TROPONINI in the last 168 hours. BNP (last 3 results) No results for input(s): PROBNP in the last 8760 hours. HbA1C: No results for input(s): HGBA1C in the last 72 hours. CBG: Recent Labs  Lab 10/13/18 1145 10/13/18 1652 10/13/18 2253 10/14/18 0743 10/14/18 1145  GLUCAP 93 258* 157* 180* 155*   Lipid Profile: No results for input(s): CHOL, HDL, LDLCALC, TRIG, CHOLHDL, LDLDIRECT in the last 72 hours. Thyroid Function Tests: No results for input(s): TSH, T4TOTAL, FREET4, T3FREE, THYROIDAB in the last 72 hours. Anemia Panel: No results for input(s): VITAMINB12, FOLATE, FERRITIN, TIBC, IRON, RETICCTPCT in the last 72 hours. Urine analysis:    Component Value Date/Time   COLORURINE YELLOW 10/10/2018 2303   APPEARANCEUR CLEAR 10/10/2018 2303   LABSPEC 1.022 10/10/2018 2303   PHURINE 6.0 10/10/2018 2303   GLUCOSEU >=500 (A) 10/10/2018 2303   HGBUR NEGATIVE 10/10/2018 2303   BILIRUBINUR NEGATIVE 10/10/2018 2303   KETONESUR NEGATIVE 10/10/2018 2303   PROTEINUR NEGATIVE 10/10/2018 2303   UROBILINOGEN 4.0 (H) 12/05/2016 0921   NITRITE NEGATIVE 10/10/2018 2303   LEUKOCYTESUR NEGATIVE 10/10/2018 2303   Recent Results (from the past 240 hour(s))  Urine Culture     Status: None   Collection Time: 10/08/18  9:30 PM  Result Value Ref Range Status   Specimen Description   Final    URINE, CLEAN CATCH Performed at [facility], [place] [address] [place], [place] [address]    Special Requests   Final    NONE Performed at [facility], [place] [address] [place], [place] [address]    Culture   Final    NO GROWTH Performed at [facility], [place] [address], [place], [place] [address]    Report Status 10/10/2018 FINAL  Final  Culture, blood (routine x 2)     Status: None (Preliminary result)   Collection Time: 10/10/18  5:13  PM  Result Value Ref Range Status   Specimen Description   Final    BLOOD LEFT ANTECUBITAL Performed at [facility] [address], [place], [place] [address]    Special Requests   Final    BOTTLES DRAWN AEROBIC AND ANAEROBIC Blood Culture adequate volume Performed at [place] [address] [place], [place] [address]    Culture   Final    NO GROWTH 4 DAYS Performed at [facility], [place] [address] [place], [place] [address]    Report Status PENDING  Incomplete  Culture, blood (routine x 2)     Status: None (Preliminary result)   Collection Time: 10/10/18  5:16 PM  Result Value Ref Range Status   Specimen Description   Final    BLOOD BLOOD LEFT ARM Performed at [facility] [address] [place], [place] [address]    Special Requests   Final    BOTTLES DRAWN AEROBIC ONLY Blood Culture adequate volume Performed at [facility] [address] [place], [place] [address]    Culture   Final    NO GROWTH 4 DAYS Performed at [facility], [place] [address] [place], [place] [address]    Report Status PENDING  Incomplete      Radiology Studies: Ir Imaging Guided Port Insertion  Result Date: 10/13/2018 CLINICAL DATA:  Pancreatic carcinoma and need for port placement prior to chemotherapy administration. EXAM: IMPLANTED PORT A CATH PLACEMENT WITH  ULTRASOUND AND FLUOROSCOPIC GUIDANCE ANESTHESIA/SEDATION: 2.0 mg IV Versed; 100 mcg IV Fentanyl Total Moderate Sedation Time:  26 minutes The patient's level of consciousness and physiologic status were continuously monitored during the procedure by Radiology nursing. FLUOROSCOPY TIME:  12 seconds.  1.4 mGy. PROCEDURE: The procedure, risks, benefits, and alternatives were explained to the patient. Questions regarding the procedure were encouraged and answered. The patient understands and consents to the procedure. A time-out was performed prior to  initiating the procedure. Ultrasound was utilized to confirm patency of the right internal jugular vein. The right neck and chest were prepped with chlorhexidine in a sterile fashion, and a sterile drape was applied covering the operative field. Maximum barrier sterile technique with sterile gowns and gloves were used for the procedure. Local anesthesia was provided with 1% lidocaine. After creating a small venotomy incision, a 21 gauge needle was advanced into the right internal jugular vein under direct, real-time ultrasound guidance. Ultrasound image documentation was performed. After securing guidewire access, an 8 Fr dilator was placed. A J-wire was kinked to measure appropriate catheter length. A subcutaneous port pocket was then created along the upper chest wall utilizing sharp and blunt dissection. Portable cautery was utilized. The pocket was irrigated with sterile saline. A single lumen power injectable port was chosen for placement. The 8 Fr catheter was tunneled from the port pocket site to the venotomy incision. The port was placed in the pocket. External catheter was trimmed to appropriate length based on guidewire measurement. At the venotomy, an 8 Fr peel-away sheath was placed over a guidewire. The catheter was then placed through the sheath and the sheath removed. Final catheter positioning was confirmed and documented with a fluoroscopic spot image. The port was accessed with a needle and aspirated and flushed with heparinized saline. The access needle was removed. The venotomy and port pocket incisions were closed with subcutaneous 3-0 Monocryl and subcuticular 4-0 Vicryl. Dermabond was applied to both incisions. COMPLICATIONS: COMPLICATIONS None FINDINGS: After catheter placement, the tip lies at the cavo-atrial junction. The catheter aspirates normally and is ready for immediate use. IMPRESSION: Placement of single lumen port a cath via right internal jugular vein. The catheter tip lies at the  cavo-atrial junction. A power injectable port a cath was placed and is ready for immediate use. Electronically Signed   By: Aletta Edouard M.D.   On: 10/13/2018 08:21    Scheduled Meds: . atorvastatin  20 mg Oral QHS  . carvedilol  3.125 mg Oral BID WC  . dronabinol  5 mg Oral BID AC  . feeding supplement (ENSURE ENLIVE)  237 mL Oral BID BM  . furosemide  20 mg Intravenous Once  . furosemide  20 mg Intravenous Once  . gabapentin  400 mg Oral TID PC & HS  . guaiFENesin  1,200 mg Oral BID  . insulin aspart  0-9 Units Subcutaneous TID AC & HS  . insulin detemir  5 Units Subcutaneous QHS  . mouth rinse  15 mL Mouth Rinse BID  . morphine  15 mg Oral Q12H  . pantoprazole  40 mg Oral BID  . senna-docusate  1 tablet Oral BID  . tamsulosin  0.4 mg Oral QHS   Continuous Infusions:    LOS: 41 days   Time spent: 25 minutes.  Patrecia Pour, MD [facility] [email or web address] Password [facility] 10/14/2018, 5:01 PM

## 2018-10-15 ENCOUNTER — Ambulatory Visit: Payer: Self-pay

## 2018-10-15 LAB — CULTURE, BLOOD (ROUTINE X 2)
CULTURE: NO GROWTH
Culture: NO GROWTH
Special Requests: ADEQUATE
Special Requests: ADEQUATE

## 2018-10-15 LAB — GLUCOSE, CAPILLARY
GLUCOSE-CAPILLARY: 64 mg/dL — AB (ref 70–99)
Glucose-Capillary: 203 mg/dL — ABNORMAL HIGH (ref 70–99)
Glucose-Capillary: 40 mg/dL — CL (ref 70–99)
Glucose-Capillary: 81 mg/dL (ref 70–99)
Glucose-Capillary: 110 mg/dL — ABNORMAL HIGH (ref 70–99)
Glucose-Capillary: 184 mg/dL — ABNORMAL HIGH (ref 70–99)

## 2018-10-15 LAB — BASIC METABOLIC PANEL
Anion gap: 9 (ref 5–15)
BUN: 7 mg/dL (ref 6–20)
CO2: 28 mmol/L (ref 22–32)
CREATININE: 0.36 mg/dL — AB (ref 0.61–1.24)
Calcium: 7.9 mg/dL — ABNORMAL LOW (ref 8.9–10.3)
Chloride: 97 mmol/L — ABNORMAL LOW (ref 98–111)
GFR calc Af Amer: >60 mL/min (ref >60)
GFR calc non Af Amer: >60 mL/min (ref >60)
Glucose, Bld: 43 mg/dL — CL (ref 70–99)
Potassium: 3.4 mmol/L — ABNORMAL LOW (ref 3.5–5.1)
Sodium: 134 mmol/L — ABNORMAL LOW (ref 135–145)

## 2018-10-15 LAB — CBC
HCT: 32.3 % — ABNORMAL LOW (ref 39.0–52.0)
Hemoglobin: 10.2 g/dL — ABNORMAL LOW (ref 13.0–17.0)
MCH: 28.3 pg (ref 26.0–34.0)
MCHC: 31.6 g/dL (ref 30.0–36.0)
MCV: 89.5 fL (ref 80.0–100.0)
Platelets: 538 10*3/uL — ABNORMAL HIGH (ref 150–400)
RBC: 3.61 MIL/uL — ABNORMAL LOW (ref 4.22–5.81)
RDW: 13.6 % (ref 11.5–15.5)
WBC: 9.8 10*3/uL (ref 4.0–10.5)
nRBC: 0 % (ref 0.0–0.2)

## 2018-10-15 MED ORDER — GLUCOSE 40 % PO GEL
ORAL | Status: AC
  Administered 2018-10-15: 37.5 g
  Filled 2018-10-15: qty 1

## 2018-10-15 MED ORDER — INSULIN ASPART 100 UNIT/ML ~~LOC~~ SOLN
1.0000 [IU] | Freq: Three times a day (TID) | SUBCUTANEOUS | Status: DC
  Administered 2018-10-16 – 2018-10-17 (×4): 1 [IU] via SUBCUTANEOUS

## 2018-10-15 MED ORDER — POTASSIUM CHLORIDE CRYS ER 20 MEQ PO TBCR
20.0000 meq | EXTENDED_RELEASE_TABLET | Freq: Once | ORAL | Status: AC
  Administered 2018-10-15: 20 meq via ORAL
  Filled 2018-10-15: qty 1

## 2018-10-15 NOTE — Progress Notes (Addendum)
Nutrition Follow-up  DOCUMENTATION CODES:   Severe malnutrition in context of chronic illness, Underweight  INTERVENTION:  - Continue Ensure Enlive BID and Magic Cup once/day.  - Continue to encourage PO intakes.    NUTRITION DIAGNOSIS:   Severe Malnutrition related to chronic illness(Pancreatic cancer, DM and CHF) as evidenced by severe muscle depletion, severe fat depletion, percent weight loss. -ongoing  GOAL:   Patient will meet greater than or equal to 90% of their needs -unmet  MONITOR:   PO intake, Supplement acceptance, Weight trends, Labs  ASSESSMENT:   58 y.o male with PMH: recent dx pancreatic cancer, DM, CHF, crack cocaine use, emboli of legs, smoker. Pt admitted with admitted with DKA and GI bleed.   No new weight since last RD assessment (1/20). Per chart review, patient consumed 25% of breakfast this AM (80 kcal, 8 grams of protein). Patient was sleeping at the time of RD visit and his girlfriend was at bedside. She states that patient's appetite is slowly improving and the amount he ate this AM is a normal amount for him. She states his appetite is still improving. Some documentation of intakes indicates 100% completion of large meals. Question if girlfriend is eating the majority of these meals. Ensure is ordered BID and patient has accepted 4 of 5 bottles since last RD assessment.     Medications reviewed; 20 mg IV lasix x2 doses 1/23, sliding scale novolog, 1 unit novolog TID, 40 mg oral protonix BID, 1 tablet senokot BID, 5 mg marinol BID. Labs reviewed; CBGs: 40-110 mg/dl today, Na: 134 mmol/l, K: 3.4 mmol/l, Cl: 97 mmol/l, creatinine: 0.36 mg/dl, Ca: 7.9 mg/dl.      Diet Order:   Diet Order            Diet regular Room service appropriate? Yes; Fluid consistency: Thin  Diet effective now              EDUCATION NEEDS:   Not appropriate for education at this time  Skin:  Skin Assessment: Reviewed RN Assessment  Last BM:  1/23  Height:   Ht  Readings from Last 1 Encounters:  09/28/18 5\' 11"  (1.803 m)    Weight:   Wt Readings from Last 1 Encounters:  10/11/18 54.7 kg    Ideal Body Weight:  78 kg  BMI:  Body mass index is 16.82 kg/m.  Estimated Nutritional Needs:   Kcal:  2000-2200 kcal  Protein:  100-110 grams  Fluid:  >/= 2L/day     Jarome Matin, MS, RD, LDN, CNSC Inpatient Clinical Dietitian Pager # [phone] After hours/weekend pager # [phone]

## 2018-10-15 NOTE — Progress Notes (Addendum)
PROGRESS NOTE  Ryan Medina  [name] DOB: 1961/09/10 DOA: 09/03/2018 PCP: Patient, No Pcp Per  Outpatient Specialists: Oncology, Dr. Marin Olp (new patient) Brief Narrative: Ryan Medina is a 58 y.o. male admitted with LLE pain found to have ischemic right leg and acute popliteal and tibial embolus s/p embolectomy 12/13. On heparin gtt. Developed acute RLL hematoma with compartment syndrome requiring fasciotomy 12/15. Decompensated 12/17 am with hypotension with abd distention and 2 massive bloody stools. Hgb drop 9.5 ->5.9. Getting 2 units PRBC, protonix, and GI consulted. Moved to ICU for hemodynamic instability. Had embolization of GDA 09/07/2018. Transferred out of ICU 12/18.  Transferred back to ICU 1/5 overnight for hypotension and recurrent anemia. He has been given a total of 26 units of PRBCs due to ongoing GI blood loss from duodenal ulceration from local invasion of pancreatic cancer. He was ultiamtely transferred to [facility] for initiation of radiation therapy. Hemoglobin has stabilized with resolution of clinical bleeding. He has since completed radiation treatments and had port inserted. His significant medical comorbidities are complicated by an adverse social situation, i.e. homelessness. With functional impairments in mobility, the safest place to discharge would be to a skilled nursing facility to improve strength to return to prior level of functioning. Unfortunately, due to cocaine positivity on admission he has been declined by local SNF's. The only other proposed disposition has been to a shelter which is not a stable enough environment for him in this current clinical state.  Assessment & Plan: Principal Problem:   Popliteal artery embolus (HCC) Active Problems:   DM (diabetes mellitus), type 2, uncontrolled ([place])   Essential hypertension   Dilated cardiomyopathy (HCC)   Chest pain   AAA (abdominal aortic aneurysm) (HCC)   Ischemic foot   Right leg  claudication (HCC)   PVD (peripheral vascular disease) ([place])   Hemorrhagic shock ([place])   Nontraumatic ischemic infarction of muscle of right lower leg   Upper GI bleed   Cocaine abuse ([place])   Acute blood loss anemia   Chronic pain syndrome   Duodenal ulcer with hemorrhage   Pancreatic cancer ([place])   Goals of care, counseling/discussion   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 Inoperable, but has undergone XRT.  - Dr. Marin Olp following patient, anticipating initiation of abraxane/gemcitabine as an outpatient. Had port placed 1/21. The patient has no transportation and would require assistance to present to frequent office visits.   Acute hemorrhagic shock secondary to acute GI bleed: Shock resolved, GI bleeding stable, improved, due to duodenal ulcer from pancreatic CA invasion. Underwent EGD/hemospray 09/07/2018, GDA embolization 09/07/2018, and underwent radiation therapy with the goal of stopping bleeding. Has received, per report 26u PRBCs throughout hospitalization (last was 1/23). s/p IV iron per hematology/oncology, repeated 1/23.  -Octreotide has been discontinued. Continue PPI twice daily.  -GI feels ongoing slow bleeding is likely to continue, will need very close CBC monitoring.  Sepsis likely secondary to HCAP and pseudomonas aeruginosa UTIs/p vancomycin and cefepime   Recurrent fever 1/19: In the absence of leukocytosis or localizing symptoms/signs. CXR improved aeration. Repeat urine culture negative.  -  Monitor blood cultures. Suspect this  could be due to malignancy and/or ?XRT. Stopped antibiotics 1/22.  - If re-fevers, would recommend repeat blood cultures and monitoring. Would have higher threshold during transfusions.  Ischemic right foot due to right popliteal and tibial artery embolus: s/p embolectomy and bovine pericardial patch angioplasty of popliteal artery and posterior tibial artery 09/03/2018 by Dr. Scot Dock. Also had recurrent right tibial embolus in the setting of stopping anticoagulation due to significant GI bleeding s/p embolectomy 09/10/2018 at the time of fasciotomy closure. - Currently stable. Continue pain control as ordered. Needs ongoing PT, has significant debility related to this which puts him at very high risk of readmission were he to attempt to thrive at a homeless shelter. - Statin. Not on anticoagulation/antiplatelet due to GI bleeding.  Chronic combined HFrEF: - Continue coreg. This is a high risk medication when outpatient due to cocaine use.  - Would recommend lasix coadministration with future PRBCs. - Not on ACE/ARB due to hx anaphylaxis with lisinopril.  Hypokalemia:  - Replace today and recheck.   Cocaine use: +UDS in Dec 2019.  - Cessation counseling provided.    Possible SBO: Resolved.  Poorly-controlled T2DM: HbA1c 18.2% Dec 2019.  - Continue novolog. At inpatient goal. Developed hypoglycemia and fasting CBGs have run low. Will DC levemir and given 1 additional unit of novolog with meals. - Hypoglycemia protocol followed, nonsevere.  Severe protein calorie malnutrition:  - Protein supplementation as able.  DVT prophylaxis: SCDs Code Status: Partial code (only desired intubation when last asked) Family Communication: Fiance at bedside Disposition Plan: Requires disposition to SNF. Not safe to be discharged to homeless shelter.   Consultants:   Oncology  IR  GI  Palliative care medicine team  PCCM  Vascular surgery  Procedures:   EGD 09/07/2018 by Dr.  Rush Landmark:  Impression:       - No gross lesions in proximal esophagus. LA Grade                            D esophagitis in middle and distal esophagus..                           - Clotted blood in the entire stomach. J-shaped                            deformity in the gastric body.                           - One oozing duodenal ulcer with an adherent clot                            (Forrest Class IIb). There is no evidence of                            perforation. Attempts at removal of the clot were                            not successful. Without family around, and his                            clinical stability, decision made to  not remove the                            clot because of concern for significant hemodynamic                            compromise. Hemospray was applied in effort to                            decrease the risk of persistent bleeding while                            awaiting next steps in evaluation/therapy as                            outlined below.                           - No gross lesions in the second portion of the                            duodenum. Recommendation: The patient will be observed post-procedure,                            until all discharge criteria are met.                           - Return care to ICU.                           - Discussed case with ICU and with Interventional                            Radiology. Overall, high concern for risk of                            rebleeding, in setting of active oozing after just                            movement of small amount of clot at the base - thus                            consistent with still active bleeding. This is too                            large of an area for other endoscopic therapies                            other than use of Hemospray which was used today.                            Next step in evaluation should be a mesenteric  angiogram and likely GDA embolization to decrease                            risk as much as possible of severe hemorrhage again.                           - Do not place NGT for the next few days due to                            severe esophagitis.                           - Continue IV PPI drip.                           - Send H. pylori serology and treat if positive.                           - The findings and recommendations were discussed                            with the referring physician.  Upper EUS 09/16/2018 by Dr. Ardis Hughs:  Impression:       - 3.6cm by 2.1cm mass in the neck of pancreas that                            surrounds the celiac trunk, obstructs the main                            pancreatic duct and appears to have also invaded                            the duodenal bulb created a large, malignant,                            friable ulcer. I biopsied the abnormal, firm mucosa                            surrounding the duodenal ulcer and sampled the                            pancreatic mass with transgastric EUS FNA.                            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  Recommendation: Return patient to hospital [facility] for ongoing care.                           - He has recieved 13 units of blood this admission,  3 of them in the past 24 hours. He will continue to                            ooze from the duodenal ulcer that I suspect is                            malignant, related to the nearby large pancreatic                            mass. Judicious use of bloodthinners is strongly                            recommended.                           - Await final cytology/pathology reports; both                            hopefully available tomorrow.  Subjective: "Just tired." Denies symptoms this morning with low blood sugar. Eating ok, no  abd pain, bleeding, dyspnea, chest pain. Can't tell improvement after transfusions. Very reluctant to work with PT.    Objective: Vitals:   10/15/18 0033 10/15/18 0100 10/15/18 0329 10/15/18 1244  BP: (!) 129/95 122/90 (!) 128/97 124/86  Pulse: 81 85 79 75  Resp: '19 20 20 16  '$ Temp: (!) 100.4 F (38 C) 99.5 F (37.5 C) 99.1 F (37.3 C) 98.8 F (37.1 C)  TempSrc: Oral Oral Oral Oral  SpO2: 97%  97% 100%  Weight:      Height:        Intake/Output Summary (Last 24 hours) at 10/15/2018 1554 Last data filed at 10/15/2018 1429 Gross per 24 hour  Intake 1054 ml  Output 2250 ml  Net -1196 ml   Filed Weights   09/28/18 1500 09/28/18 1914 10/11/18 1057  Weight: 59 kg 57.6 kg 54.7 kg   Gen: Thin, frail, chronically ill-appearing male in no distress Pulm: Nonlabored breathing room air. Clear. CV: Regular rate and rhythm. No murmur, rub, or gallop. No JVD, no dependent edema. GI: Abdomen soft, non-tender, non-distended, with normoactive bowel sounds.  Ext: Warm, no deformities. Healing incisions on right lower leg. Diminished DP pulse, luke warm, comparable to left foot. Skin: No new rashes, lesions or ulcers on visualized skin. Neuro: Alert and oriented. No focal neurological deficits. Psych: Judgement and insight appear fair. Mood irritable & affect congruent. Behavior is appropriate.    Data Reviewed: I have personally reviewed following labs and imaging studies  CBC: Recent Labs  Lab 10/10/18 0332 10/11/18 0355 10/12/18 0434 10/13/18 1051 10/14/18 0321 10/15/18 0500  WBC 10.0 11.7* 8.9 7.3  --  9.8  NEUTROABS  --  9.9* 7.3  --   --   --   HGB 8.3* 8.3* 8.4* 7.8* 7.8* 10.2*  HCT 26.7* 27.1* 27.1* 25.2* 25.1* 32.3*  MCV 88.7 90.0 90.0 91.0  --  89.5  PLT 470* 451* 457* 437*  --  710*   Basic Metabolic Panel: Recent Labs  Lab 10/11/18 0355 10/13/18 1051 10/14/18 0321 10/15/18 0500  NA 135 136  --  134*  K 3.4* 3.6  --  3.4*  CL 100 102  --  97*  CO2  27 26  --  28    GLUCOSE 70 102*  --  43*  BUN 10 12  --  7  CREATININE 0.42* 0.51* 0.39* 0.36*  CALCIUM 7.2* 7.2*  --  7.9*   GFR: Estimated Creatinine Clearance: 78.8 mL/min (A) (by C-G formula based on SCr of 0.36 mg/dL (L)). Liver Function Tests: No results for input(s): AST, ALT, ALKPHOS, BILITOT, PROT, ALBUMIN in the last 168 hours. No results for input(s): LIPASE, AMYLASE in the last 168 hours. No results for input(s): AMMONIA in the last 168 hours. Coagulation Profile: Recent Labs  Lab 10/11/18 0355  INR 1.15   Cardiac Enzymes: No results for input(s): CKTOTAL, CKMB, CKMBINDEX, TROPONINI in the last 168 hours. BNP (last 3 results) No results for input(s): PROBNP in the last 8760 hours. HbA1C: No results for input(s): HGBA1C in the last 72 hours. CBG: Recent Labs  Lab 10/14/18 2053 10/15/18 0705 10/15/18 0731 10/15/18 0809 10/15/18 1141  GLUCAP 160* 40* 64* 81 110*   Lipid Profile: No results for input(s): CHOL, HDL, LDLCALC, TRIG, CHOLHDL, LDLDIRECT in the last 72 hours. Thyroid Function Tests: No results for input(s): TSH, T4TOTAL, FREET4, T3FREE, THYROIDAB in the last 72 hours. Anemia Panel: No results for input(s): VITAMINB12, FOLATE, FERRITIN, TIBC, IRON, RETICCTPCT in the last 72 hours. Urine analysis:    Component Value Date/Time   COLORURINE YELLOW 10/10/2018 2303   APPEARANCEUR CLEAR 10/10/2018 2303   LABSPEC 1.022 10/10/2018 2303   PHURINE 6.0 10/10/2018 2303   GLUCOSEU >=500 (A) 10/10/2018 2303   HGBUR NEGATIVE 10/10/2018 2303   BILIRUBINUR NEGATIVE 10/10/2018 2303   KETONESUR NEGATIVE 10/10/2018 2303   PROTEINUR NEGATIVE 10/10/2018 2303   UROBILINOGEN 4.0 (H) 12/05/2016 0921   NITRITE NEGATIVE 10/10/2018 2303   LEUKOCYTESUR NEGATIVE 10/10/2018 2303   Recent Results (from the past 240 hour(s))  Urine Culture     Status: None   Collection Time: 10/08/18  9:30 PM  Result Value Ref Range Status   Specimen Description   Final    URINE, CLEAN CATCH Performed  at [facility], [place] [address], [place], [place] [address]    Special Requests   Final    NONE Performed at [facility], [place] [address] [place], [place] [address]    Culture   Final    NO GROWTH Performed at [facility], [place] [address] [place], [place] [address]    Report Status 10/10/2018 FINAL  Final  Culture, blood (routine x 2)     Status: None   Collection Time: 10/10/18  5:13 PM  Result Value Ref Range Status   Specimen Description   Final    BLOOD LEFT ANTECUBITAL Performed at [place] [address] [place], [place] [address]    Special Requests   Final    BOTTLES DRAWN AEROBIC AND ANAEROBIC Blood Culture adequate volume Performed at [place] [address], [place], [place] [address]    Culture   Final    NO GROWTH 5 DAYS Performed at [facility], [place] [address], [place], [place] [address]    Report Status 10/15/2018 FINAL  Final  Culture, blood (routine x 2)     Status: None   Collection Time: 10/10/18  5:16 PM  Result Value Ref Range Status   Specimen Description   Final    BLOOD BLOOD LEFT ARM Performed at [facility] [address] [place], [place] [address]  Special Requests   Final    BOTTLES DRAWN AEROBIC ONLY Blood Culture adequate volume Performed at [place] [address], [place], [place] [address]    Culture   Final    NO GROWTH 5 DAYS Performed at [facility], [place] [address], [place], [place] [address]    Report Status 10/15/2018 FINAL  Final      Radiology Studies: No results found.  Scheduled Meds: . atorvastatin  20 mg Oral QHS  . carvedilol  3.125 mg Oral BID WC  . dronabinol  5 mg Oral BID AC  . feeding supplement (ENSURE ENLIVE)  237 mL Oral BID BM  . furosemide  20 mg Intravenous Once  . furosemide  20 mg Intravenous Once  . gabapentin  400 mg Oral TID PC & HS  . guaiFENesin   1,200 mg Oral BID  . insulin aspart  0-9 Units Subcutaneous TID AC & HS  . insulin aspart  1 Units Subcutaneous TID WC  . mouth rinse  15 mL Mouth Rinse BID  . morphine  15 mg Oral Q12H  . pantoprazole  40 mg Oral BID  . potassium chloride  20 mEq Oral Once  . senna-docusate  1 tablet Oral BID  . tamsulosin  0.4 mg Oral QHS   Continuous Infusions:    LOS: 42 days   Time spent: 35 minutes.  Patrecia Pour, MD [facility] [email or web address] Password [facility] 10/15/2018, 3:54 PM

## 2018-10-15 NOTE — Plan of Care (Signed)
Pt's Hgb WDL after 2 Units of blood administered.

## 2018-10-15 NOTE — Progress Notes (Signed)
Hypoglycemic Event  CBG: 64  Treatment: 4 oz juice/soda  Symptoms: Shaky  Follow-up CBG: [id number] CBG Result:81  Possible Reasons for Event: Medication regimen: insulin- long acting  Comments/MD notified:MD aware, orders placed changing insulin doses    Marcell Anger, Marveen Reeks

## 2018-10-15 NOTE — Progress Notes (Signed)
Physical Therapy Treatment Patient Details Name: Ryan Medina MRN: [id number] DOB: 12-06-1960 Today's Date: 10/15/2018    History of Present Illness 58 y.o. male with PMH including but not limited to DM and HTN who presented to the hospital on 12/13 with a right ischemic leg secondary to acute popliteal and tibial embolus, underwent embolectomy on 12/13 and subsequently was placed on a heparin drip.  He unfortunately developed a acute right lower leg hematoma with compartment syndrome requiring fasciotomy on 12/15.  Further hospital course was complicated by development of hemorrhagic shock with acute blood loss anemia secondary to a bleeding duodenal ulcer-requiring ICU transfer-intubation for airway protection and IR evaluation with mesenteric angiogram and GDA embolization.  Upon stability he was transferred back to the [facility] on 12/19. Pt is now s/p closure of R LE fasciotomy on 09/10/18. 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  Plans to transfer to [facility] for radiation treatment.     PT Comments    Max encouragement for participation. Pt was verbally aggressive towards his girlfriend/fiance when she encouraged him as well. Max VCs for technique to encourage pt to TRY to put weight through R LE. He is not very motivated to progress activity or attempt weightbearing on the R LE. Will continue to follow and progress activity as pt will allow.     Follow Up Recommendations  SNF(pt is homeless)     Equipment Recommendations  Rolling walker with 5" wheels;3in1 (PT);Wheelchair ;  Recommendations for Other Services       Precautions / Restrictions Precautions Precautions: Fall Other Brace: obtained a CAM boot for right  ankle hoping to tolerate  more WB, did not change the pain with dependency of right foot. Restrictions Weight Bearing Restrictions: No RLE Weight Bearing: Weight bearing as tolerated    Mobility  Bed Mobility Overal  bed mobility: Independent                Transfers Overall transfer level: Needs assistance Equipment used: Rolling walker (2 wheeled) Transfers: Sit to/from Stand Sit to Stand: Supervision            Ambulation/Gait Ambulation/Gait assistance: Supervision Gait Distance (Feet): 15 Feet Assistive device: Rolling walker (2 wheeled) Gait Pattern/deviations: Step-to pattern     General Gait Details: Pt uses NWB gait pattern 2* pain. Cues for pt to try to put foot down (at least the toes) each time he gets up to ambulate. VCs safety, technique, sequence.    Stairs             Wheelchair Mobility    Modified Rankin (Stroke Patients Only)       Balance Overall balance assessment: Needs assistance         Standing balance support: Bilateral upper extremity supported Standing balance-Leahy Scale: Poor                              Cognition Arousal/Alertness: Awake/alert Behavior During Therapy: WFL for tasks assessed/performed Overall Cognitive Status: Within Functional Limits for tasks assessed                                        Exercises      General Comments        Pertinent Vitals/Pain Pain Assessment: Faces Faces Pain Scale: Hurts even more Pain Location:  R LE Pain Descriptors / Indicators: Grimacing;Guarding;Sore Pain Intervention(s): Limited activity within patient's tolerance;Repositioned    Home Living                      Prior Function            PT Goals (current goals can now be found in the care plan section) Progress towards PT goals: Progressing toward goals    Frequency    Min 2X/week      PT Plan Current plan remains appropriate    Co-evaluation              AM-PAC PT "6 Clicks" Mobility   Outcome Measure  Help needed turning from your back to your side while in a flat bed without using bedrails?: None Help needed moving from lying on your back to sitting on the  side of a flat bed without using bedrails?: None Help needed moving to and from a bed to a chair (including a wheelchair)?: A Little Help needed standing up from a chair using your arms (e.g., wheelchair or bedside chair)?: A Little Help needed to walk in hospital room?: A Little Help needed climbing 3-5 steps with a railing? : A Little 6 Click Score: 20    End of Session   Activity Tolerance: Patient limited by pain Patient left: in bed;with call bell/phone within reach;with family/visitor present   PT Visit Diagnosis: Other abnormalities of gait and mobility (R26.89);Pain Pain - Right/Left: Right Pain - part of body: Leg     Time: 4696-2952 PT Time Calculation (min) (ACUTE ONLY): 16 min  Charges:  $Gait Training: 8-22 mins                       Weston Anna, PT Acute Rehabilitation Services Pager: [phone] Office: [phone]

## 2018-10-15 NOTE — Progress Notes (Signed)
Patient refusing most medications other than morphine.  Was able to get patient to agree to taking coreg.  Indications explained to patient on each prescribed med, but continues to refuse.  States "I just don't feel like taking them."  Denies any adverse side effects from any medications - has no particular reason for not wanting to take them.

## 2018-10-16 LAB — GLUCOSE, CAPILLARY
GLUCOSE-CAPILLARY: 182 mg/dL — AB (ref 70–99)
GLUCOSE-CAPILLARY: 191 mg/dL — AB (ref 70–99)
Glucose-Capillary: 183 mg/dL — ABNORMAL HIGH (ref 70–99)
Glucose-Capillary: 232 mg/dL — ABNORMAL HIGH (ref 70–99)
Glucose-Capillary: 152 mg/dL — ABNORMAL HIGH (ref 70–99)

## 2018-10-16 LAB — CBC
HCT: 32.4 % — ABNORMAL LOW (ref 39.0–52.0)
HEMOGLOBIN: 10.3 g/dL — AB (ref 13.0–17.0)
MCH: 27.7 pg (ref 26.0–34.0)
MCHC: 31.8 g/dL (ref 30.0–36.0)
MCV: 87.1 fL (ref 80.0–100.0)
Platelets: 478 10*3/uL — ABNORMAL HIGH (ref 150–400)
RBC: 3.72 MIL/uL — ABNORMAL LOW (ref 4.22–5.81)
RDW: 13.6 % (ref 11.5–15.5)
WBC: 11.8 10*3/uL — AB (ref 4.0–10.5)
nRBC: 0 % (ref 0.0–0.2)

## 2018-10-16 LAB — BPAM RBC
Blood Product Expiration Date: 202002122359
Blood Product Expiration Date: 202002122359
ISSUE DATE / TIME: 202001231732
ISSUE DATE / TIME: 202001240038
Unit Type and Rh: 7300
Unit Type and Rh: 7300

## 2018-10-16 LAB — TYPE AND SCREEN
ABO/RH(D): B POS
Antibody Screen: NEGATIVE
Unit division: 0
Unit division: 0

## 2018-10-16 LAB — BASIC METABOLIC PANEL
ANION GAP: 8 (ref 5–15)
BUN: 8 mg/dL (ref 6–20)
CO2: 29 mmol/L (ref 22–32)
Calcium: 7.6 mg/dL — ABNORMAL LOW (ref 8.9–10.3)
Chloride: 94 mmol/L — ABNORMAL LOW (ref 98–111)
Creatinine, Ser: 0.48 mg/dL — ABNORMAL LOW (ref 0.61–1.24)
GFR calc Af Amer: >60 mL/min (ref >60)
GFR calc non Af Amer: >60 mL/min (ref >60)
Glucose, Bld: 205 mg/dL — ABNORMAL HIGH (ref 70–99)
Potassium: 3.9 mmol/L (ref 3.5–5.1)
SODIUM: 131 mmol/L — AB (ref 135–145)

## 2018-10-16 NOTE — Progress Notes (Signed)
PROGRESS NOTE  Ryan Medina  YYQ:[id number] DOB: [birth year] DOA: 09/03/2018 PCP: Patient, No Pcp Per  Outpatient Specialists: Oncology, Dr. Marin Olp (new patient) Brief Narrative: Ryan Medina is a 58 y.o. male admitted with LLE pain found to have ischemic right leg and acute popliteal and tibial embolus s/p embolectomy 12/13. On heparin gtt. Developed acute RLL hematoma with compartment syndrome requiring fasciotomy 12/15. Decompensated 12/17 am with hypotension with abd distention and 2 massive bloody stools. Hgb drop 9.5 ->5.9. Getting 2 units PRBC, protonix, and GI consulted. Moved to ICU for hemodynamic instability. Had embolization of GDA 09/07/2018. Transferred out of ICU 12/18.  Transferred back to ICU 1/5 overnight for hypotension and recurrent anemia. He has been given a total of 26 units of PRBCs due to ongoing GI blood loss from duodenal ulceration from local invasion of pancreatic cancer. He was ultiamtely transferred to [facility] for initiation of radiation therapy. Hemoglobin has stabilized with resolution of clinical bleeding. He has since completed radiation treatments and had port inserted. His significant medical comorbidities are complicated by an adverse social situation, i.e. homelessness. With functional impairments in mobility, the safest place to discharge would be to a skilled nursing facility to improve strength to return to prior level of functioning. Unfortunately, due to cocaine positivity on admission he has been declined by local SNF's. The only other proposed disposition has been to a shelter which is not a stable enough environment for him in this current clinical state.  Assessment & Plan: Principal Problem:   Popliteal artery embolus (HCC) Active Problems:   DM (diabetes mellitus), type 2, uncontrolled ([place])   Essential hypertension   Dilated cardiomyopathy (HCC)   Chest pain   AAA (abdominal aortic aneurysm) (HCC)   Ischemic foot   Right leg  claudication (HCC)   PVD (peripheral vascular disease) ([place])   Hemorrhagic shock ([place])   Nontraumatic ischemic infarction of muscle of right lower leg   Upper GI bleed   Cocaine abuse ([place])   Acute blood loss anemia   Chronic pain syndrome   Duodenal ulcer with hemorrhage   Pancreatic cancer ([place])   Goals of care, counseling/discussion   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 Inoperable, but has undergone XRT.  - Dr. Marin Olp following patient, anticipating initiation of abraxane/gemcitabine as an outpatient. Had port placed 1/21. The patient has no transportation and would require assistance to present to frequent office visits.   Acute hemorrhagic shock secondary to acute GI bleed: Shock resolved, GI bleeding stable, improved, due to duodenal ulcer from pancreatic CA invasion. Underwent EGD/hemospray 09/07/2018, GDA embolization 09/07/2018, and underwent radiation therapy with the goal of stopping bleeding. Has received, per report 26u PRBCs throughout hospitalization (last was 1/23). s/p IV iron per hematology/oncology, repeated 1/23.  -Octreotide has been discontinued. Continue PPI twice daily.  -GI feels ongoing slow bleeding is likely to continue, will need very close CBC monitoring.  Sepsis likely secondary to HCAP and pseudomonas aeruginosa UTIs/p vancomycin and cefepime   Recurrent fever 1/19: In the absence of leukocytosis or localizing symptoms/signs. CXR improved aeration. Repeat urine culture negative.  Blood cultures negative.  - DC PICC. -  If re-fevers, would recommend repeat blood cultures and monitoring. Would have higher threshold during transfusions.  Ischemic right foot due to right popliteal and tibial artery embolus: s/p embolectomy and bovine pericardial patch angioplasty of popliteal artery and posterior tibial artery 09/03/2018 by Dr. Scot Dock. Also had recurrent right tibial embolus in the setting of stopping anticoagulation due to significant GI bleeding s/p embolectomy 09/10/2018 at the time of fasciotomy closure. - Currently stable. Continue pain control as ordered. Needs ongoing PT, has significant debility related to this which puts him at very high risk of readmission were he to attempt to thrive at a homeless shelter. - Statin. Not on anticoagulation/antiplatelet due to GI bleeding.  Chronic combined HFrEF: - Continue coreg. This is a high risk medication when outpatient due to cocaine use.  - Would recommend lasix coadministration with future PRBCs. - Not on ACE/ARB due to hx anaphylaxis with lisinopril.  Hypokalemia:  - Replaced, resolved.  Cocaine use: +UDS in Dec 2019.  - Cessation counseling provided.    Possible SBO: Resolved.  Poorly-controlled T2DM: HbA1c 18.2% Dec 2019.  - Continue novolog SSI and 1u TIDWC. DC'ed lantus when hypogylcemic fasting CBG 1/24.  Severe protein calorie malnutrition:  - Protein supplementation as able.  DVT prophylaxis: SCDs Code Status: Partial code (only desired intubation when last asked) Family Communication: Fiance at bedside Disposition Plan: Requires disposition to SNF. Not safe to be discharged to homeless shelter.   Consultants:   Oncology  IR  GI  Palliative care medicine team  PCCM  Vascular surgery  Procedures:   EGD 09/07/2018 by Dr. Rush Landmark:  Impression:       - No gross lesions in proximal esophagus. LA Grade                            D esophagitis in middle and distal esophagus..                           - Clotted blood in the  entire stomach. J-shaped                            deformity in the gastric body.                           - One oozing duodenal ulcer with an adherent clot                            ([place] Class IIb). There is no evidence of                            perforation. Attempts at removal of the clot were                            not successful. Without family around, and his                            clinical stability, decision made to not remove the  clot because of concern for significant hemodynamic                            compromise. Hemospray was applied in effort to                            decrease the risk of persistent bleeding while                            awaiting next steps in evaluation/therapy as                            outlined below.                           - No gross lesions in the second portion of the                            duodenum. Recommendation: The patient will be observed post-procedure,                            until all discharge criteria are met.                           - Return care to ICU.                           - Discussed case with ICU and with Interventional                            Radiology. Overall, high concern for risk of                            rebleeding, in setting of active oozing after just                            movement of small amount of clot at the base - thus                            consistent with still active bleeding. This is too                            large of an area for other endoscopic therapies                            other than use of Hemospray which was used today.                            Next step in evaluation should be a mesenteric                            angiogram and likely GDA embolization to decrease  risk as much as possible of severe hemorrhage again.                           - Do not place NGT for the next few days  due to                            severe esophagitis.                           - Continue IV PPI drip.                           - Send H. pylori serology and treat if positive.                           - The findings and recommendations were discussed                            with the referring physician.  Upper EUS 09/16/2018 by Dr. Ardis Hughs:  Impression:       - 3.6cm by 2.1cm mass in the neck of pancreas that                            surrounds the celiac trunk, obstructs the main                            pancreatic duct and appears to have also invaded                            the duodenal bulb created a large, malignant,                            friable ulcer. I biopsied the abnormal, firm mucosa                            surrounding the duodenal ulcer and sampled the                            pancreatic mass with transgastric EUS FNA.                            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  Recommendation: Return patient to hospital [facility] for ongoing care.                           - He has recieved 13 units of blood this admission,                            3 of them in the past 24 hours. He will continue to  ooze from the duodenal ulcer that I suspect is                            malignant, related to the nearby large pancreatic                            mass. Judicious use of bloodthinners is strongly                            recommended.                           - Await final cytology/pathology reports; both                            hopefully available tomorrow.  Subjective: Eating better, blood sugars under better control with no further hypoglycemia. Got up to walk in hallways earlier with moderate, nonradiating, constant pain in the right foot better with pain medications. Has gotten 1 dose of IV morphine in past couple days. Amenable to trying to  treat this only with po medications.  Objective: Vitals:   10/15/18 1640 10/15/18 2250 10/16/18 0419 10/16/18 1539  BP: 136/77 (!) 129/102 (!) 132/98 (!) 145/91  Pulse: 86 88 85 84  Resp: _0 Temp:  99.1 F (37.3 C) 98.6 F (37 C) 99.3 F (37.4 C)  TempSrc:  Oral Oral Oral  SpO2: 98% 97% 100% 100%  Weight:      Height:        Intake/Output Summary (Last 24 hours) at 10/16/2018 1657 Last data filed at 10/16/2018 1541 Gross per 24 hour  Intake 60 ml  Output 375 ml  Net -315 ml   Filed Weights   09/28/18 1500 09/28/18 1914 10/11/18 1057  Weight: 59 kg 57.6 kg 54.7 kg   Gen: 58 y.o. male in no distress, frail, thin Pulm: Nonlabored breathing room air. Clear. CV: Regular rate and rhythm. No murmur, rub, or gallop. No JVD, no dependent edema. GI: Abdomen soft, non-tender, non-distended, with normoactive bowel sounds.  Ext: Warm, no deformities Skin: No new rashes, lesions or ulcers on visualized skin. RUE PICC c/d/i, port in right upper chest c/d/i, modestly tender. Neuro: Alert and oriented. No focal neurological deficits. Psych: Judgement and insight appear fair. Mood euthymic & affect congruent. Behavior is appropriate.    Data Reviewed: I have personally reviewed following labs and imaging studies  CBC: Recent Labs  Lab 10/11/18 0355 10/12/18 0434 10/13/18 1051 10/14/18 0321 10/15/18 0500 10/16/18 0318  WBC 11.7* 8.9 7.3  --  9.8 11.8*  NEUTROABS 9.9* 7.3  --   --   --   --   HGB 8.3* 8.4* 7.8* 7.8* 10.2* 10.3*  HCT 27.1* 27.1* 25.2* 25.1* 32.3* 32.4*  MCV 90.0 90.0 91.0  --  89.5 87.1  PLT 451* 457* 437*  --  538* 116*   Basic Metabolic Panel: Recent Labs  Lab 10/11/18 0355 10/13/18 1051 10/14/18 0321 10/15/18 0500 10/16/18 0318  NA 135 136  --  134* 131*  K 3.4* 3.6  --  3.4* 3.9  CL 100 102  --  97* 94*  CO2 27 26  --  28 29  GLUCOSE 70 102*  --  43* 205*  BUN 10 12  --  7 8  CREATININE 0.42* 0.51* 0.39* 0.36* 0.48*  CALCIUM 7.2* 7.2*  --   7.9* 7.6*   GFR: Estimated Creatinine Clearance: 78.8 mL/min (A) (by C-G formula based on SCr of 0.48 mg/dL (L)). Liver Function Tests: No results for input(s): AST, ALT, ALKPHOS, BILITOT, PROT, ALBUMIN in the last 168 hours. No results for input(s): LIPASE, AMYLASE in the last 168 hours. No results for input(s): AMMONIA in the last 168 hours. Coagulation Profile: Recent Labs  Lab 10/11/18 0355  INR 1.15   Cardiac Enzymes: No results for input(s): CKTOTAL, CKMB, CKMBINDEX, TROPONINI in the last 168 hours. BNP (last 3 results) No results for input(s): PROBNP in the last 8760 hours. HbA1C: No results for input(s): HGBA1C in the last 72 hours. CBG: Recent Labs  Lab 10/15/18 1622 10/16/18 0421 10/16/18 0747 10/16/18 1203 10/16/18 1631  GLUCAP 184* 182* 191* 183* 232*   Lipid Profile: No results for input(s): CHOL, HDL, LDLCALC, TRIG, CHOLHDL, LDLDIRECT in the last 72 hours. Thyroid Function Tests: No results for input(s): TSH, T4TOTAL, FREET4, T3FREE, THYROIDAB in the last 72 hours. Anemia Panel: No results for input(s): VITAMINB12, FOLATE, FERRITIN, TIBC, IRON, RETICCTPCT in the last 72 hours. Urine analysis:    Component Value Date/Time   COLORURINE YELLOW 10/10/2018 2303   APPEARANCEUR CLEAR 10/10/2018 2303   LABSPEC 1.022 10/10/2018 2303   PHURINE 6.0 10/10/2018 2303   GLUCOSEU >=500 (A) 10/10/2018 2303   HGBUR NEGATIVE 10/10/2018 2303   BILIRUBINUR NEGATIVE 10/10/2018 2303   KETONESUR NEGATIVE 10/10/2018 2303   PROTEINUR NEGATIVE 10/10/2018 2303   UROBILINOGEN 4.0 (H) 12/05/2016 0921   NITRITE NEGATIVE 10/10/2018 2303   LEUKOCYTESUR NEGATIVE 10/10/2018 2303   Recent Results (from the past 240 hour(s))  Urine Culture     Status: None   Collection Time: 10/08/18  9:30 PM  Result Value Ref Range Status   Specimen Description   Final    URINE, CLEAN CATCH Performed at [facility], [place] [address] [place], [place] [address]    Special  Requests   Final    NONE Performed at [facility], [place] [address] [place], [place] [address]    Culture   Final    NO GROWTH Performed at [facility], [place] [address] [place], [place] [address]    Report Status 10/10/2018 FINAL  Final  Culture, blood (routine x 2)     Status: None   Collection Time: 10/10/18  5:13 PM  Result Value Ref Range Status   Specimen Description   Final    BLOOD LEFT ANTECUBITAL Performed at [place] [address], [place], [place] [address]    Special Requests   Final    BOTTLES DRAWN AEROBIC AND ANAEROBIC Blood Culture adequate volume Performed at [place] [address] [place], [place] [address]    Culture   Final    NO GROWTH 5 DAYS Performed at [facility], [place] [address] [place], [place] [address]    Report Status 10/15/2018 FINAL  Final  Culture, blood (routine x 2)     Status: None   Collection Time: 10/10/18  5:16 PM  Result Value Ref Range Status   Specimen Description   Final    BLOOD BLOOD LEFT ARM Performed at [place] [address], [place],  [address]    Special Requests   Final    BOTTLES DRAWN AEROBIC ONLY Blood Culture adequate volume Performed at  [facility], [place] [address], [place], [place] [address]    Culture   Final    NO GROWTH 5 DAYS Performed at [facility], [place] [address] [place], [place] [address]    Report Status 10/15/2018 FINAL  Final      Radiology Studies: No results found.  Scheduled Meds: . atorvastatin  20 mg Oral QHS  . carvedilol  3.125 mg Oral BID WC  . dronabinol  5 mg Oral BID AC  . feeding supplement (ENSURE ENLIVE)  237 mL Oral BID BM  . gabapentin  400 mg Oral TID PC & HS  . guaiFENesin  1,200 mg Oral BID  . insulin aspart  0-9 Units Subcutaneous TID AC & HS  . insulin aspart  1 Units Subcutaneous TID WC  . mouth rinse  15 mL Mouth Rinse BID  .  morphine  15 mg Oral Q12H  . pantoprazole  40 mg Oral BID  . senna-docusate  1 tablet Oral BID  . tamsulosin  0.4 mg Oral QHS   Continuous Infusions:    LOS: 43 days   Time spent: 25 minutes.  Patrecia Pour, MD [facility] [email or web address] Password [facility] 10/16/2018, 4:57 PM

## 2018-10-17 LAB — GLUCOSE, CAPILLARY
GLUCOSE-CAPILLARY: 199 mg/dL — AB (ref 70–99)
Glucose-Capillary: 198 mg/dL — ABNORMAL HIGH (ref 70–99)
Glucose-Capillary: 112 mg/dL — ABNORMAL HIGH (ref 70–99)
Glucose-Capillary: 192 mg/dL — ABNORMAL HIGH (ref 70–99)

## 2018-10-17 NOTE — Progress Notes (Signed)
PROGRESS NOTE  Ryan Medina  NLZ:[id number] DOB: Jan 23, 1961 DOA: 09/03/2018 PCP: Patient, No Pcp Per  Outpatient Specialists: Oncology, Dr. Marin Olp (new patient) Brief Narrative: Ryan Medina is a 58 y.o. male admitted with LLE pain found to have ischemic right leg and acute popliteal and tibial embolus s/p embolectomy 12/13. On heparin gtt. Developed acute RLL hematoma with compartment syndrome requiring fasciotomy 12/15. Decompensated 12/17 am with hypotension with abd distention and 2 massive bloody stools. Hgb drop 9.5 ->5.9. Getting 2 units PRBC, protonix, and GI consulted. Moved to ICU for hemodynamic instability. Had embolization of GDA 09/07/2018. Transferred out of ICU 12/18.  Transferred back to ICU 1/5 overnight for hypotension and recurrent anemia. He has been given a total of 26 units of PRBCs due to ongoing GI blood loss from duodenal ulceration from local invasion of pancreatic cancer. He was ultiamtely transferred to [facility] for initiation of radiation therapy. Hemoglobin has stabilized with resolution of clinical bleeding. He has since completed radiation treatments and had port inserted. His significant medical comorbidities are complicated by an adverse social situation, i.e. homelessness. With functional impairments in mobility, the safest place to discharge would be to a skilled nursing facility to improve strength to return to prior level of functioning. Unfortunately, due to cocaine positivity on admission he has been declined by local SNF's. The only other proposed disposition has been to a shelter which is not a stable enough environment for him in this current clinical state.  Assessment & Plan: Principal Problem:   Popliteal artery embolus (HCC) Active Problems:   DM (diabetes mellitus), type 2, uncontrolled ([place])   Essential hypertension   Dilated cardiomyopathy (HCC)   Chest pain   AAA (abdominal aortic aneurysm) (HCC)   Ischemic foot   Right leg  claudication (HCC)   PVD (peripheral vascular disease) ([place])   Hemorrhagic shock ([place])   Nontraumatic ischemic infarction of muscle of right lower leg   Upper GI bleed   Cocaine abuse ([place])   Acute blood loss anemia   Chronic pain syndrome   Duodenal ulcer with hemorrhage   Pancreatic cancer (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 Inoperable, but has undergone XRT.  - Dr. Marin Olp following patient, anticipating initiation of abraxane/gemcitabine as an outpatient. Had port placed 1/21. The patient has no transportation and would require assistance to present to frequent office visits.   Acute hemorrhagic shock secondary to acute GI bleed: Shock resolved, GI bleeding stable, improved, due to duodenal ulcer from pancreatic CA invasion. Underwent EGD/hemospray 09/07/2018, GDA embolization 09/07/2018, and underwent radiation therapy with the goal of stopping bleeding. Has received, per report 26u PRBCs throughout hospitalization (last was 1/23). s/p IV iron per hematology/oncology, repeated 1/23.  -Octreotide has been discontinued.  -Continue PPI twice daily. Refused this today. Will need ongoing education. -GI feels ongoing slow bleeding is likely to continue, will need very close CBC monitoring.  Sepsis likely secondary to HCAP and pseudomonas aeruginosa UTIs/p vancomycin and cefepime   Recurrent fever 1/19: In the absence of leukocytosis or localizing symptoms/signs.  CXR improved aeration. Repeat urine culture  negative. Blood cultures negative.  - DC PICC. - If re-fevers, would recommend repeat blood cultures and monitoring. Would have higher threshold during transfusions.  Ischemic right foot due to right popliteal and tibial artery embolus: s/p embolectomy and bovine pericardial patch angioplasty of popliteal artery and posterior tibial artery 09/03/2018 by Dr. Scot Dock. Also had recurrent right tibial embolus in the setting of stopping anticoagulation due to significant GI bleeding s/p embolectomy 09/10/2018 at the time of fasciotomy closure. - Currently stable. Continue pain control as ordered, no indication for IV or liquid pain medications. Needs ongoing PT, has significant debility related to this which puts him at very high risk of readmission were he to attempt to thrive at a homeless shelter. - Statin. Not on anticoagulation/antiplatelet due to GI bleeding.  Chronic combined HFrEF: - Continue coreg. This is a high risk medication when outpatient due to cocaine use.  - Would recommend lasix coadministration with future PRBCs. - Not on ACE/ARB due to hx anaphylaxis with lisinopril.  Hypokalemia:  - Replaced, resolved.  Cocaine use: +UDS in Dec 2019.  - Cessation counseling provided.    Possible SBO: Resolved.  Poorly-controlled T2DM: HbA1c 18.2% Dec 2019.  - Continue novolog SSI and 1u TIDWC. DC'ed lantus when hypogylcemic fasting CBG 1/24.  Severe protein calorie malnutrition:  - Protein supplementation as able.  DVT prophylaxis: SCDs Code Status: Partial code (only desired intubation when last asked) Family Communication: Fiance at bedside Disposition Plan: Requires discharge to SNF, medically stable for that disposition. Not safe to be discharged to homeless shelter.   Consultants:   Oncology  IR  GI  Palliative care medicine team  PCCM  Vascular surgery  Procedures:   EGD 09/07/2018 by Dr. Rush Landmark:  Impression:       - No gross lesions in proximal  esophagus. LA Grade                            D esophagitis in middle and distal esophagus..                           - Clotted blood in the entire stomach. J-shaped                            deformity in the gastric body.                           - One oozing duodenal ulcer with an adherent clot                            (Forrest Class IIb). There is no evidence of                            perforation. Attempts at removal of the clot were                            not successful. Without family around, and his                            clinical stability, decision made to not remove the  clot because of concern for significant hemodynamic                            compromise. Hemospray was applied in effort to                            decrease the risk of persistent bleeding while                            awaiting next steps in evaluation/therapy as                            outlined below.                           - No gross lesions in the second portion of the                            duodenum. Recommendation: The patient will be observed post-procedure,                            until all discharge criteria are met.                           - Return care to ICU.                           - Discussed case with ICU and with Interventional                            Radiology. Overall, high concern for risk of                            rebleeding, in setting of active oozing after just                            movement of small amount of clot at the base - thus                            consistent with still active bleeding. This is too                            large of an area for other endoscopic therapies                            other than use of Hemospray which was used today.                            Next step in evaluation should be a mesenteric                            angiogram and likely GDA embolization to decrease  risk as much as possible of severe hemorrhage again.                           - Do not place NGT for the next few days due to                            severe esophagitis.                           - Continue IV PPI drip.                           - Send H. pylori serology and treat if positive.                           - The findings and recommendations were discussed                            with the referring physician.  Upper EUS 09/16/2018 by Dr. Ardis Hughs:  Impression:       - 3.6cm by 2.1cm mass in the neck of pancreas that                            surrounds the celiac trunk, obstructs the main                            pancreatic duct and appears to have also invaded                            the duodenal bulb created a large, malignant,                            friable ulcer. I biopsied the abnormal, firm mucosa                            surrounding the duodenal ulcer and sampled the                            pancreatic mass with transgastric EUS FNA.                            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  Recommendation: Return patient to hospital [facility] for ongoing care.                           - He has recieved 13 units of blood this admission,                            3 of them in the past 24 hours. He will continue to  ooze from the duodenal ulcer that I suspect is                            malignant, related to the nearby large pancreatic                            mass. Judicious use of bloodthinners is strongly                            recommended.                           - Await final cytology/pathology reports; both                            hopefully available tomorrow.  Subjective: No new complaints, getting up and moving more, bearing some weight on right foot. Eating better. Requests liquid pain medications    Objective: Vitals:   10/16/18 0419 10/16/18 1539 10/16/18 2124 10/17/18 1443  BP: (!) 132/98 (!) 145/91 (!) 117/96 116/83  Pulse: 85 84 89 89  Resp: _0 Temp: 98.6 F (37 C) 99.3 F (37.4 C) 99.2 F (37.3 C) 100 F (37.8 C)  TempSrc: Oral Oral Oral Oral  SpO2: 100% 100% 100% 97%  Weight:      Height:        Intake/Output Summary (Last 24 hours) at 10/17/2018 1937 Last data filed at 10/17/2018 0504 Gross per 24 hour  Intake 240 ml  Output 350 ml  Net -110 ml   Filed Weights   09/28/18 1500 09/28/18 1914 10/11/18 1057  Weight: 59 kg 57.6 kg 54.7 kg   Gen: 58 y.o. male in no distress Pulm: Nonlabored breathing room air. Clear. CV: Regular rate and rhythm. No murmur, rub, or gallop. No JVD, no dependent edema. GI: Abdomen soft, non-tender, non-distended, with normoactive bowel sounds.  Ext: Warm, no deformities. RLE with warm foot. Skin: No new rashes, lesions or ulcers on visualized skin. Neuro: Alert and oriented. No focal neurological deficits. Psych: Judgement and insight appear fair. Mood euthymic & affect congruent. Behavior is appropriate.    Data Reviewed: I have personally reviewed following labs and imaging studies  CBC: Recent Labs  Lab 10/11/18 0355 10/12/18 0434 10/13/18 1051 10/14/18 0321 10/15/18 0500 10/16/18 0318  WBC 11.7* 8.9 7.3  --  9.8 11.8*  NEUTROABS 9.9* 7.3  --   --   --   --   HGB 8.3* 8.4* 7.8* 7.8* 10.2* 10.3*  HCT 27.1* 27.1* 25.2* 25.1* 32.3* 32.4*  MCV 90.0 90.0 91.0  --  89.5 87.1  PLT 451* 457* 437*  --  538* 948*   Basic Metabolic Panel: Recent Labs  Lab 10/11/18 0355 10/13/18 1051 10/14/18 0321 10/15/18 0500 10/16/18 0318  NA 135 136  --  134* 131*  K 3.4* 3.6  --  3.4* 3.9  CL 100 102  --  97* 94*  CO2 27 26  --  28 29  GLUCOSE 70 102*  --  43* 205*  BUN 10 12  --  7 8  CREATININE 0.42* 0.51* 0.39* 0.36* 0.48*  CALCIUM 7.2* 7.2*  --  7.9* 7.6*   GFR: Estimated Creatinine Clearance: 78.8 mL/min (A) (by  C-G formula based on SCr of  0.48 mg/dL (L)). Liver Function Tests: No results for input(s): AST, ALT, ALKPHOS, BILITOT, PROT, ALBUMIN in the last 168 hours. No results for input(s): LIPASE, AMYLASE in the last 168 hours. No results for input(s): AMMONIA in the last 168 hours. Coagulation Profile: Recent Labs  Lab 10/11/18 0355  INR 1.15   Cardiac Enzymes: No results for input(s): CKTOTAL, CKMB, CKMBINDEX, TROPONINI in the last 168 hours. BNP (last 3 results) No results for input(s): PROBNP in the last 8760 hours. HbA1C: No results for input(s): HGBA1C in the last 72 hours. CBG: Recent Labs  Lab 10/16/18 1631 10/16/18 2127 10/17/18 0754 10/17/18 1125 10/17/18 1627  GLUCAP 232* 152* 198* 112* 192*   Lipid Profile: No results for input(s): CHOL, HDL, LDLCALC, TRIG, CHOLHDL, LDLDIRECT in the last 72 hours. Thyroid Function Tests: No results for input(s): TSH, T4TOTAL, FREET4, T3FREE, THYROIDAB in the last 72 hours. Anemia Panel: No results for input(s): VITAMINB12, FOLATE, FERRITIN, TIBC, IRON, RETICCTPCT in the last 72 hours. Urine analysis:    Component Value Date/Time   COLORURINE YELLOW 10/10/2018 2303   APPEARANCEUR CLEAR 10/10/2018 2303   LABSPEC 1.022 10/10/2018 2303   PHURINE 6.0 10/10/2018 2303   GLUCOSEU >=500 (A) 10/10/2018 2303   HGBUR NEGATIVE 10/10/2018 2303   BILIRUBINUR NEGATIVE 10/10/2018 2303   KETONESUR NEGATIVE 10/10/2018 2303   PROTEINUR NEGATIVE 10/10/2018 2303   UROBILINOGEN 4.0 (H) 12/05/2016 0921   NITRITE NEGATIVE 10/10/2018 2303   LEUKOCYTESUR NEGATIVE 10/10/2018 2303   Recent Results (from the past 240 hour(s))  Urine Culture     Status: None   Collection Time: 10/08/18  9:30 PM  Result Value Ref Range Status   Specimen Description   Final    URINE, CLEAN CATCH Performed at [facility], [place] [address], [place], [place] [address]    Special Requests   Final    NONE Performed at [facility], [place] [address], [place], [place] [address]    Culture   Final    NO GROWTH Performed at [facility], [place] [address], [place], [place] [address]    Report Status 10/10/2018 FINAL  Final  Culture, blood (routine x 2)     Status: None   Collection Time: 10/10/18  5:13 PM  Result Value Ref Range Status   Specimen Description   Final    BLOOD LEFT ANTECUBITAL Performed at [facility] [address], [place], [place] [address]    Special Requests   Final    BOTTLES DRAWN AEROBIC AND ANAEROBIC Blood Culture adequate volume Performed at [facility] [address], [place], [place] [address]    Culture   Final    NO GROWTH 5 DAYS Performed at [facility], [place] [address], [place], [place] [address]    Report Status 10/15/2018 FINAL  Final  Culture, blood (routine x 2)     Status: None   Collection Time: 10/10/18  5:16 PM  Result Value Ref Range Status   Specimen Description   Final    BLOOD BLOOD LEFT ARM Performed at [facility] [address], [place], [place] [address]    Special Requests   Final    BOTTLES DRAWN AEROBIC ONLY Blood Culture adequate volume Performed at [facility] [address] [place], [place] [address]    Culture   Final    NO GROWTH 5 DAYS Performed at [facility], [place] [address],  [place], [place] [address]    Report Status 10/15/2018 FINAL  Final      Radiology Studies: No results found.  Scheduled Meds: . atorvastatin  20 mg Oral QHS  . carvedilol  3.125 mg Oral BID WC  . dronabinol  5 mg Oral BID AC  . feeding supplement (ENSURE ENLIVE)  237 mL Oral BID BM  . gabapentin  400 mg Oral TID PC & HS  . guaiFENesin  1,200 mg Oral BID  . insulin aspart  0-9 Units Subcutaneous TID AC & HS  . insulin aspart  1 Units Subcutaneous TID WC  . mouth rinse  15 mL Mouth Rinse BID  . morphine  15 mg Oral Q12H  . pantoprazole  40 mg Oral BID  . senna-docusate  1  tablet Oral BID  . tamsulosin  0.4 mg Oral QHS   Continuous Infusions:    LOS: 44 days   Time spent: 25 minutes.  Patrecia Pour, MD [facility] [email or web address] Password [facility] 10/17/2018, 7:37 PM

## 2018-10-17 NOTE — Progress Notes (Addendum)
Pt has refused several meds this am .He refused musinex. He stated that he doesn' t need it. He refused Marinol,protonix . He took Neurontin, coreg and pain med. He has slept most of the day and evening.His only compliant was that he "needs to catch up on sleep"  .

## 2018-10-18 ENCOUNTER — Encounter: Payer: Self-pay | Admitting: Radiation Oncology

## 2018-10-18 ENCOUNTER — Ambulatory Visit: Payer: Self-pay

## 2018-10-18 LAB — CBC
HCT: 29.7 % — ABNORMAL LOW (ref 39.0–52.0)
Hemoglobin: 9.6 g/dL — ABNORMAL LOW (ref 13.0–17.0)
MCH: 29.2 pg (ref 26.0–34.0)
MCHC: 32.3 g/dL (ref 30.0–36.0)
MCV: 90.3 fL (ref 80.0–100.0)
Platelets: 395 10*3/uL (ref 150–400)
RBC: 3.29 MIL/uL — ABNORMAL LOW (ref 4.22–5.81)
RDW: 13.8 % (ref 11.5–15.5)
WBC: 11 10*3/uL — ABNORMAL HIGH (ref 4.0–10.5)
nRBC: 0 % (ref 0.0–0.2)

## 2018-10-18 LAB — GLUCOSE, CAPILLARY
GLUCOSE-CAPILLARY: 162 mg/dL — AB (ref 70–99)
Glucose-Capillary: 250 mg/dL — ABNORMAL HIGH (ref 70–99)
Glucose-Capillary: 196 mg/dL — ABNORMAL HIGH (ref 70–99)
Glucose-Capillary: 108 mg/dL — ABNORMAL HIGH (ref 70–99)

## 2018-10-18 LAB — IRON AND TIBC
Iron: 29 ug/dL — ABNORMAL LOW (ref 45–182)
Saturation Ratios: 25 % (ref 17.9–39.5)
TIBC: 117 ug/dL — ABNORMAL LOW (ref 250–450)
UIBC: 88 ug/dL

## 2018-10-18 LAB — BASIC METABOLIC PANEL
Anion gap: 9 (ref 5–15)
BUN: 8 mg/dL (ref 6–20)
CO2: 28 mmol/L (ref 22–32)
CREATININE: 0.42 mg/dL — AB (ref 0.61–1.24)
Calcium: 7.6 mg/dL — ABNORMAL LOW (ref 8.9–10.3)
Chloride: 96 mmol/L — ABNORMAL LOW (ref 98–111)
GFR calc non Af Amer: >60 mL/min (ref >60)
Glucose, Bld: 266 mg/dL — ABNORMAL HIGH (ref 70–99)
Potassium: 3.3 mmol/L — ABNORMAL LOW (ref 3.5–5.1)
Sodium: 133 mmol/L — ABNORMAL LOW (ref 135–145)

## 2018-10-18 LAB — FERRITIN: Ferritin: 1128 ng/mL — ABNORMAL HIGH (ref 24–336)

## 2018-10-18 MED ORDER — POTASSIUM CHLORIDE CRYS ER 20 MEQ PO TBCR
40.0000 meq | EXTENDED_RELEASE_TABLET | Freq: Once | ORAL | Status: AC
  Administered 2018-10-18: 40 meq via ORAL
  Filled 2018-10-18: qty 2

## 2018-10-18 MED ORDER — INSULIN ASPART 100 UNIT/ML ~~LOC~~ SOLN
3.0000 [IU] | Freq: Three times a day (TID) | SUBCUTANEOUS | Status: DC
  Administered 2018-10-18 – 2018-10-20 (×6): 3 [IU] via SUBCUTANEOUS

## 2018-10-18 NOTE — Progress Notes (Signed)
Ryan Medina is still in the hospital.  It seems like there might be an issue with finding a place for him to stay.  I feel bad for him.  I know everybody is trying their best so that he can be placed.  He is doing fairly well.  He is eating real well.  He is having no nausea or vomiting.  He is having no issues with bleeding.  There is no melena or hematochezia.  His labs today show white cell count of 11 hemoglobin 9.6 and platelet counts are 95,000.  His blood sugar is 266.  This definitely is to be under good control.  His only pain is in his right foot.  He probably will have chronic pain from nerve damage secondary to the arterial emboli.  He is getting out of bed which is nice to see.  On his physical exam, his vital signs are temperature 99.3.  Pulse 85.  Blood pressure 135/84.  Head neck exam shows no scleral icterus.  He has no oral thrush.  There is no adenopathy in the neck.  Lungs are clear bilaterally.  Cardiac exam regular rate and rhythm with no murmurs, rubs or bruits.  Abdomen is soft.  Bowel sounds are present.  There is no guarding or rebound tenderness.  There is no fluid wave.  Extremities shows no edema.  He has a healing surgical scars from his thrombectomies.  Neurological exam shows no focal neurological deficits.  Hopefully, [name] can be placed.  He was homeless when he was admitted.  I realize that his past "indulgence" with recreational drugs may make it more difficult to find him a place to stay.  I do not expect him to start systemic chemotherapy for couple weeks.  It be nice to let the radiation that he had settled down a little bit.  [name], MD  Romans 8:31

## 2018-10-18 NOTE — Progress Notes (Signed)
  Radiation Oncology         [phone] ________________________________  Name: Ryan Medina MRN: [id number]  Date: 10/18/2018  DOB: 1960-10-23  End of Treatment Note  Diagnosis:   Pancreatic adenocarcinoma    Indication for treatment:  Palliative       Radiation treatment dates:   09/30/18-10/13/18  Site/dose:   Pancreas; 30 Gy in 10 fractions of 3 Gy  Beams/energy:   Photon Isodose; 10X, 15X   Narrative: The patient tolerated radiation treatment relatively well.    He remained inpatient for the duration of his treatment, but overall tolerated it well. He denied N/V. He had less abdominal distention toward the end of treatment. He reported regular bowel movement toward the end of treatment. No significant reports of GI bleeding.  Overall the pt was without complaints.   Plan: The patient has completed radiation treatment. The patient will return to radiation oncology clinic for routine followup in one month. I advised them to call or return sooner if they have any questions or concerns related to their recovery or treatment.  -----------------------------------  Blair Promise, PhD, MD  This document serves as a record of services personally performed by Gery Pray, MD. It was created on his behalf by Mary-Margaret Loma Messing, a trained medical scribe. The creation of this record is based on the scribe's personal observations and the provider's statements to them. This document has been checked and approved by the attending provider.

## 2018-10-18 NOTE — Progress Notes (Signed)
PROGRESS NOTE  [place] Shannahan Launer  JKD:[id number] DOB: 06/20/1961 DOA: 09/03/2018 PCP: Patient, No Pcp Per  Outpatient Specialists: Oncology, Dr. Marin Olp (new patient) Brief Narrative: Ryan Medina is a 58 y.o. male admitted with LLE pain found to have ischemic right leg and acute popliteal and tibial embolus s/p embolectomy 12/13. On heparin gtt. Developed acute RLL hematoma with compartment syndrome requiring fasciotomy 12/15. Decompensated 12/17 am with hypotension with abd distention and 2 massive bloody stools. Hgb drop 9.5 ->5.9. Getting 2 units PRBC, protonix, and GI consulted. Moved to ICU for hemodynamic instability. Had embolization of GDA 09/07/2018. Transferred out of ICU 12/18.  Transferred back to ICU 1/5 overnight for hypotension and recurrent anemia. He has been given a total of 26 units of PRBCs due to ongoing GI blood loss from duodenal ulceration from local invasion of pancreatic cancer. He was ultiamtely transferred to [facility] for initiation of radiation therapy. Hemoglobin has stabilized with resolution of clinical bleeding. He has since completed radiation treatments and had port inserted. His significant medical comorbidities are complicated by an adverse social situation, i.e. homelessness. With functional impairments in mobility, the safest place to discharge would be to a skilled nursing facility to improve strength to return to prior level of functioning. Unfortunately, due to cocaine positivity on admission he has been declined by local SNF's. The only other proposed disposition has been to a shelter which is not a stable enough environment for him in this current clinical state.  Assessment & Plan: Principal Problem:   Popliteal artery embolus (HCC) Active Problems:   DM (diabetes mellitus), type 2, uncontrolled ([place])   Essential hypertension   Dilated cardiomyopathy (HCC)   Chest pain   AAA (abdominal aortic aneurysm) (HCC)   Ischemic foot   Right leg  claudication (HCC)   PVD (peripheral vascular disease) ([place])   Hemorrhagic shock ([place])   Nontraumatic ischemic infarction of muscle of right lower leg   Upper GI bleed   Cocaine abuse ([place])   Acute blood loss anemia   Chronic pain syndrome   Duodenal ulcer with hemorrhage   Pancreatic cancer ([place])   Goals of care, counseling/discussion   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 Inoperable, but has undergone XRT.  - Dr. Marin Olp following patient, anticipating initiation of abraxane/gemcitabine as an outpatient. Had port placed 1/21. The patient has no transportation and would require assistance to present to frequent office visits.   Acute hemorrhagic shock secondary to acute GI bleed: Shock resolved, GI bleeding stable, improved, due to duodenal ulcer from pancreatic CA invasion. Underwent EGD/hemospray 09/07/2018, GDA embolization 09/07/2018, and underwent radiation therapy with the goal of stopping bleeding. Has received, per report 26u PRBCs throughout hospitalization (last was 1/23). s/p IV iron per hematology/oncology, repeated 1/23.  -Continue PPI twice daily. Requires ongoing education regarding this medication. -GI feels ongoing slow bleeding is likely to continue, will need very close CBC monitoring.  Sepsis likely secondary to HCAP and pseudomonas aeruginosa UTIs/p vancomycin and cefepime: Resolved.  Recurrent fever 1/19: In the absence of leukocytosis or localizing symptoms/signs. CXR improved aeration. Repeat urine culture  negative. Blood  cultures negative. PICC pulled 1/26. - If re-fevers, would recommend repeat blood cultures and monitoring. Would have higher threshold during transfusions.  Ischemic right foot due to right popliteal and tibial artery embolus: s/p embolectomy and bovine pericardial patch angioplasty of popliteal artery and posterior tibial artery 09/03/2018 by Dr. Scot Dock. Also had recurrent right tibial embolus in the setting of stopping anticoagulation due to significant GI bleeding s/p embolectomy 09/10/2018 at the time of fasciotomy closure. - Currently stable. Continue pain control as ordered, no indication for IV or liquid pain medications. Needs ongoing PT, has significant debility related to this which puts him at very high risk of readmission were he to attempt to thrive at a homeless shelter. - Statin. Not on anticoagulation/antiplatelet due to GI bleeding.  Chronic combined HFrEF: - Continue coreg. This is a high risk medication when outpatient due to cocaine use.  - Would recommend lasix coadministration with future PRBCs. - Not on ACE/ARB due to hx anaphylaxis with lisinopril.  Hypokalemia:  - Repeat supplementation and recheck intermittently.  Cocaine use: +UDS in Dec 2019.  - Cessation counseling provided.    Possible SBO: Resolved.  Poorly-controlled T2DM: HbA1c 18.2% Dec 2019 (average ~449m/dl) - Continue novolog SSI, will increase mealtime to 3u and stopped lantus. Will permit some hyperglycemia due to dietary indiscretions to avoid hypoglycemia. Regardless, control is much better than previously.   Severe protein calorie malnutrition:  - Protein supplementation as able.  DVT prophylaxis: SCDs Code Status: Partial code (only desired intubation when last asked) Family Communication: Fiance at bedside Disposition Plan: Requires discharge to SNF, medically stable for that disposition. Not safe to be discharged to homeless shelter.   Consultants:    Oncology  IR  GI  Palliative care medicine team  PCCM  Vascular surgery  Procedures:   EGD 09/07/2018 by Dr. MRush Landmark  Impression:       - No gross lesions in proximal esophagus. LA Grade                            D esophagitis in middle and distal esophagus..                           - Clotted blood in the entire stomach. J-shaped                            deformity in the gastric body.                           - One oozing duodenal ulcer with an adherent clot                            ([place]rest Class IIb). There is no evidence of                            perforation. Attempts at removal of the clot were                            not successful. Without family around, and his  clinical stability, decision made to not remove the                            clot because of concern for significant hemodynamic                            compromise. Hemospray was applied in effort to                            decrease the risk of persistent bleeding while                            awaiting next steps in evaluation/therapy as                            outlined below.                           - No gross lesions in the second portion of the                            duodenum. Recommendation: The patient will be observed post-procedure,                            until all discharge criteria are met.                           - Return care to ICU.                           - Discussed case with ICU and with Interventional                            Radiology. Overall, high concern for risk of                            rebleeding, in setting of active oozing after just                            movement of small amount of clot at the base - thus                            consistent with still active bleeding. This is too                            large of an area for other endoscopic therapies                            other than use of  Hemospray which was used today.                            Next step in evaluation should be a mesenteric  angiogram and likely GDA embolization to decrease                            risk as much as possible of severe hemorrhage again.                           - Do not place NGT for the next few days due to                            severe esophagitis.                           - Continue IV PPI drip.                           - Send H. pylori serology and treat if positive.                           - The findings and recommendations were discussed                            with the referring physician.  Upper EUS 09/16/2018 by Dr. Ardis Hughs:  Impression:       - 3.6cm by 2.1cm mass in the neck of pancreas that                            surrounds the celiac trunk, obstructs the main                            pancreatic duct and appears to have also invaded                            the duodenal bulb created a large, malignant,                            friable ulcer. I biopsied the abnormal, firm mucosa                            surrounding the duodenal ulcer and sampled the                            pancreatic mass with transgastric EUS FNA.                            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  Recommendation: Return patient to hospital [facility] for ongoing care.                           - He has recieved 13 units of blood this admission,  3 of them in the past 24 hours. He will continue to                            ooze from the duodenal ulcer that I suspect is                            malignant, related to the nearby large pancreatic                            mass. Judicious use of bloodthinners is strongly                            recommended.                           - Await final cytology/pathology reports; both                             hopefully available tomorrow.  Subjective: Feels sleepy, but no dyspnea, chest pain, palpitations or bleeding noted. Getting up more. Ate a pizza yesterday and is refusing several medications intermittently.    Objective: Vitals:   10/16/18 2124 10/17/18 1443 10/17/18 2132 10/18/18 0507  BP: (!) 117/96 116/83 129/86 135/84  Pulse: 89 89 84 85  Resp: 18 18    Temp: 99.2 F (37.3 C) 100 F (37.8 C) 99.2 F (37.3 C) 99.3 F (37.4 C)  TempSrc: Oral Oral Oral Oral  SpO2: 100% 97% 100% 99%  Weight:      Height:        Intake/Output Summary (Last 24 hours) at 10/18/2018 1258 Last data filed at 10/18/2018 9163 Gross per 24 hour  Intake 240 ml  Output 600 ml  Net -360 ml   Filed Weights   09/28/18 1500 09/28/18 1914 10/11/18 1057  Weight: 59 kg 57.6 kg 54.7 kg   Gen: 58 y.o. male in no distress Pulm: Nonlabored breathing room air. Clear. CV: Regular rate and rhythm. No murmur, rub, or gallop. No JVD, no dependent edema. GI: Abdomen soft, non-tender, non-distended, with normoactive bowel sounds.  Ext: Warm, no deformities. No cyanosis. Skin: No new rashes, lesions or ulcers on visualized skin. Port site c/d/i. Neuro: Alert and oriented. No focal neurological deficits. Psych: Judgement and insight appear fair. Mood euthymic & affect congruent. Behavior is appropriate.    Data Reviewed: I have personally reviewed following labs and imaging studies  CBC: Recent Labs  Lab 10/12/18 0434 10/13/18 1051 10/14/18 0321 10/15/18 0500 10/16/18 0318 10/18/18 0521  WBC 8.9 7.3  --  9.8 11.8* 11.0*  NEUTROABS 7.3  --   --   --   --   --   HGB 8.4* 7.8* 7.8* 10.2* 10.3* 9.6*  HCT 27.1* 25.2* 25.1* 32.3* 32.4* 29.7*  MCV 90.0 91.0  --  89.5 87.1 90.3  PLT 457* 437*  --  538* 478* 846   Basic Metabolic Panel: Recent Labs  Lab 10/13/18 1051 10/14/18 0321 10/15/18 0500 10/16/18 0318 10/18/18 0521  NA 136  --  134* 131* 133*  K 3.6  --  3.4* 3.9 3.3*  CL 102  --  97* 94* 96*   CO2 26  --  _0 GLUCOSE 102*  --  43* 205* 266*  BUN 12  --  _0 CREATININE 0.51* 0.39* 0.36* 0.48* 0.42*  CALCIUM 7.2*  --  7.9* 7.6* 7.6*   CBG: Recent Labs  Lab 10/17/18 1125 10/17/18 1627 10/17/18 2133 10/18/18 0733 10/18/18 1109  GLUCAP 112* 192* 199* 250* 196*   Anemia Panel: Recent Labs    10/18/18 0842  FERRITIN 1,128*  TIBC 117*  IRON 29*   Urine analysis:    Component Value Date/Time   COLORURINE YELLOW 10/10/2018 2303   APPEARANCEUR CLEAR 10/10/2018 2303   LABSPEC 1.022 10/10/2018 2303   PHURINE 6.0 10/10/2018 2303   GLUCOSEU >=500 (A) 10/10/2018 2303   HGBUR NEGATIVE 10/10/2018 2303   BILIRUBINUR NEGATIVE 10/10/2018 2303   KETONESUR NEGATIVE 10/10/2018 2303   PROTEINUR NEGATIVE 10/10/2018 2303   UROBILINOGEN 4.0 (H) 12/05/2016 0921   NITRITE NEGATIVE 10/10/2018 2303   LEUKOCYTESUR NEGATIVE 10/10/2018 2303   Recent Results (from the past 240 hour(s))  Urine Culture     Status: None   Collection Time: 10/08/18  9:30 PM  Result Value Ref Range Status   Specimen Description   Final    URINE, CLEAN CATCH Performed at [facility], [place] [address], [place], [place] [address]    Special Requests   Final    NONE Performed at [facility], [place] [address], [place], [place] [address]    Culture   Final    NO GROWTH Performed at [facility], [place] [address] [place], [place] [address]    Report Status 10/10/2018 FINAL  Final  Culture, blood (routine x 2)     Status: None   Collection Time: 10/10/18  5:13 PM  Result Value Ref Range Status   Specimen Description   Final    BLOOD LEFT ANTECUBITAL Performed at [place] [address] [place], [place] [address]    Special Requests   Final    BOTTLES DRAWN AEROBIC AND ANAEROBIC Blood Culture adequate volume Performed at [place] [address] [place], [place] [address]    Culture   Final    NO GROWTH  5 DAYS Performed at [facility], [place] [address] [place], [place] [address]    Report Status 10/15/2018 FINAL  Final  Culture, blood (routine x 2)     Status: None   Collection Time: 10/10/18  5:16 PM  Result Value Ref Range Status   Specimen Description   Final    BLOOD BLOOD LEFT ARM Performed at [place] [address] [place], [place] [address]    Special Requests   Final    BOTTLES DRAWN AEROBIC ONLY Blood Culture adequate volume Performed at [place] [address] [place], [place] [address]    Culture   Final    NO GROWTH 5 DAYS Performed at [facility], [place] [address], [place], [place] [address]    Report Status 10/15/2018 FINAL  Final      Radiology Studies: No results found.  Scheduled Meds: . atorvastatin  20 mg Oral QHS  . carvedilol  3.125 mg Oral BID WC  . dronabinol  5 mg Oral BID AC  . feeding supplement (ENSURE ENLIVE)  237 mL Oral BID BM  . gabapentin  400 mg Oral TID PC & HS  . guaiFENesin  1,200 mg Oral BID  . insulin aspart  0-9 Units Subcutaneous TID AC & HS  . insulin  aspart  3 Units Subcutaneous TID WC  . mouth rinse  15 mL Mouth Rinse BID  . morphine  15 mg Oral Q12H  . pantoprazole  40 mg Oral BID  . senna-docusate  1 tablet Oral BID  . tamsulosin  0.4 mg Oral QHS   Continuous Infusions:    LOS: 45 days   Time spent: 25 minutes.  Patrecia Pour, MD [facility] [email or web address] Password [facility] 10/18/2018, 12:58 PM

## 2018-10-19 ENCOUNTER — Ambulatory Visit: Payer: Self-pay

## 2018-10-19 LAB — GLUCOSE, CAPILLARY
GLUCOSE-CAPILLARY: 345 mg/dL — AB (ref 70–99)
Glucose-Capillary: 163 mg/dL — ABNORMAL HIGH (ref 70–99)
Glucose-Capillary: 285 mg/dL — ABNORMAL HIGH (ref 70–99)
Glucose-Capillary: 163 mg/dL — ABNORMAL HIGH (ref 70–99)

## 2018-10-19 MED ORDER — SODIUM CHLORIDE 0.9 % IV SOLN
510.0000 mg | Freq: Once | INTRAVENOUS | Status: AC
  Administered 2018-10-19: 510 mg via INTRAVENOUS
  Filled 2018-10-19: qty 17

## 2018-10-19 MED ORDER — ONDANSETRON HCL 4 MG PO TABS: 4.0000 mg | ORAL_TABLET | Freq: Three times a day (TID) | ORAL | Status: DC | PRN

## 2018-10-19 MED ORDER — SODIUM CHLORIDE 0.9% FLUSH: 10.0000 mL | INTRAVENOUS | Status: DC | PRN

## 2018-10-19 MED ORDER — SODIUM CHLORIDE 0.9% FLUSH
10.0000 mL | Freq: Two times a day (BID) | INTRAVENOUS | Status: DC
  Administered 2018-10-19 – 2018-10-20 (×2): 10 mL

## 2018-10-19 MED ORDER — ORAL CARE MOUTH RINSE: 15.0000 mL | Freq: Two times a day (BID) | OROMUCOSAL | Status: DC | PRN

## 2018-10-19 NOTE — Progress Notes (Signed)
Received consult for IV access. Pt had implanted port placed on 1/21. Noted to have had several PIVs during this hospitalization. Port accessed. Teach back complete with pt and S/O.

## 2018-10-19 NOTE — Progress Notes (Signed)
Physical Therapy Treatment Patient Details Name: Ryan Medina MRN: [id number] DOB: October 12, 1960 Today's Date: 10/19/2018    History of Present Illness 58 y.o. male with PMH including but not limited to DM and HTN who presented to the hospital on 12/13 with a right ischemic leg secondary to acute popliteal and tibial embolus, underwent embolectomy on 12/13 and subsequently was placed on a heparin drip.  He unfortunately developed a acute right lower leg hematoma with compartment syndrome requiring fasciotomy on 12/15.  Further hospital course was complicated by development of hemorrhagic shock with acute blood loss anemia secondary to a bleeding duodenal ulcer-requiring ICU transfer-intubation for airway protection and IR evaluation with mesenteric angiogram and GDA embolization.  Upon stability he was transferred back to the [facility] on 12/19. Pt is now s/p closure of R LE fasciotomy on 09/10/18. 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  Plans to transfer to [facility] for radiation treatment.     PT Comments    Pt unable to tolerate the CAM boot for gait and declined gait overall.  Worked on de-sensitization techniques in sitting without WB.  Pt instructed to practice this throughout the day and he verbalized understanding.    Follow Up Recommendations  SNF;Other (comment)(pt is homeless)     Clinical biochemist with 5" wheels;3in1 (PT);Wheelchair (measurements PT);Wheelchair cushion (measurements PT)    Recommendations for Other Services       Precautions / Restrictions Precautions Precautions: Fall Restrictions Weight Bearing Restrictions: No RLE Weight Bearing: Weight bearing as tolerated    Mobility  Bed Mobility   Bed Mobility: Supine to Sit;Sit to Supine     Supine to sit: Supervision Sit to supine: Supervision   General bed mobility comments: He moves slowly, but does best when he does it on his own with  increased time. At EOB, attempted x2 to place CAM boot on, but felt it was too painful on the bottom of his foot.  Transfers Overall transfer level: Needs assistance   Transfers: Stand Pivot Transfers   Stand pivot transfers: Supervision       General transfer comment: Transfered to recliner.  In recliner, worked on de-sensitization with foot placed on floor with no WB.  He performed multiple times with 30 seconds being longest he could place on floor before needing to move it.  Ambulation/Gait             General Gait Details: declined secondary to pain   Stairs             Wheelchair Mobility    Modified Rankin (Stroke Patients Only)       Balance                                            Cognition Arousal/Alertness: Awake/alert Behavior During Therapy: WFL for tasks assessed/performed Overall Cognitive Status: Within Functional Limits for tasks assessed                                        Exercises      General Comments General comments (skin integrity, edema, etc.): Encouraged de-sensitization using the floor while sitting EOB and in chair.  He states he can touch the bottom of his foot with his hand  and not have pain, but when it is down and placed on something pain is bad.      Pertinent Vitals/Pain Pain Assessment: Faces Faces Pain Scale: Hurts whole lot Pain Location: bottom of R foot Pain Descriptors / Indicators: Throbbing;Restless Pain Intervention(s): Limited activity within patient's tolerance;Monitored during session;Repositioned    Home Living                      Prior Function            PT Goals (current goals can now be found in the care plan section) Acute Rehab PT Goals PT Goal Formulation: With patient Time For Goal Achievement: 11/02/18 Potential to Achieve Goals: Fair Progress towards PT goals: Not progressing toward goals - comment(limited by pain)    Frequency     Min 2X/week      PT Plan Current plan remains appropriate    Co-evaluation              AM-PAC PT "6 Clicks" Mobility   Outcome Measure  Help needed turning from your back to your side while in a flat bed without using bedrails?: None Help needed moving from lying on your back to sitting on the side of a flat bed without using bedrails?: None Help needed moving to and from a bed to a chair (including a wheelchair)?: A Little Help needed standing up from a chair using your arms (e.g., wheelchair or bedside chair)?: A Little Help needed to walk in hospital room?: A Little Help needed climbing 3-5 steps with a railing? : A Little 6 Click Score: 20    End of Session         PT Visit Diagnosis: Other abnormalities of gait and mobility (R26.89);Pain Pain - Right/Left: Right Pain - part of body: Ankle and joints of foot     Time: 6606-0045 PT Time Calculation (min) (ACUTE ONLY): 13 min  Charges:  $Neuromuscular Re-education: 8-22 mins                     Appolonia Ackert L. Tamala Julian, [place] Pager [phone] 10/19/2018    Galen Manila 10/19/2018, 2:06 PM

## 2018-10-19 NOTE — Progress Notes (Signed)
Clinical Social Worker following patient for support and dishcarge needs. Surveyor, quantity of Social Work is working on placing patient into a SNF for rehab. CSW made AD of Social Work aware that facilities he wanted patient to be place in has decline patient due to his social background (Homelessness, cocaine use and smoker). Several facilities in the [place] area has declined patient. CSW is awaiting for AD to assist with patient placement.  Rhea Pink, MSW,  [place]

## 2018-10-19 NOTE — Progress Notes (Signed)
Occupational Therapy Treatment Patient Details Name: Ryan Medina Otoole MRN: [id number] DOB: [birth year] Today's Date: 10/19/2018    History of present illness 58 y.o. male with PMH including but not limited to DM and HTN who presented to the hospital on 12/13 with a right ischemic leg secondary to acute popliteal and tibial embolus, underwent embolectomy on 12/13 and subsequently was placed on a heparin drip.  He unfortunately developed a acute right lower leg hematoma with compartment syndrome requiring fasciotomy on 12/15.  Further hospital course was complicated by development of hemorrhagic shock with acute blood loss anemia secondary to a bleeding duodenal ulcer-requiring ICU transfer-intubation for airway protection and IR evaluation with mesenteric angiogram and GDA embolization.  Upon stability he was transferred back to the triad hospitalist service on 12/19. Pt is now s/p closure of R LE fasciotomy on 09/10/18. 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  Plans to transfer to [facility] for radiation treatment.       Follow Up Recommendations  SNF          Precautions / Restrictions Precautions Precautions: Fall Restrictions Weight Bearing Restrictions: No RLE Weight Bearing: Weight bearing as tolerated       Mobility Bed Mobility   Bed Mobility: Sit to Supine;Supine to Sit     Supine to sit: Min guard Sit to supine: Supervision   General bed mobility comments: Increased time and encouragement  Transfers                 General transfer comment: refused        ADL either performed or assessed with clinical judgement   ADL Overall ADL's : Needs assistance/impaired Eating/Feeding: Independent;Sitting   Grooming: Set up;Sitting                                 General ADL Comments: Pt sat EOB with OT then refused to get to chair. Pt and girlfriend complaining about staff bothering them all the time,  Both state they cant  get any rest.  Explained staff were trying to A pt as needed.  OT did report their frustrations to Mudlogger.                 Cognition Arousal/Alertness: Awake/alert Behavior During Therapy: WFL for tasks assessed/performed Overall Cognitive Status: Within Functional Limits for tasks assessed                                                     Pertinent Vitals/ Pain       Pain Assessment: Faces Faces Pain Scale: Hurts little more Pain Location: did not localize Pain Descriptors / Indicators: Discomfort Pain Intervention(s): Limited activity within patient's tolerance;Repositioned          Frequency           Progress Toward Goals  OT Goals(current goals can now be found in the care plan section)  Progress towards OT goals: Not progressing toward goals - comment(lack of participation)     Plan Discharge plan needs to be updated       AM-PAC OT "6 Clicks" Daily Activity     Outcome Measure   Help from another person eating meals?: None Help from another person taking care of personal grooming?: A  Little Help from another person toileting, which includes using toliet, bedpan, or urinal?: A Lot Help from another person bathing (including washing, rinsing, drying)?: A Lot Help from another person to put on and taking off regular upper body clothing?: A Little Help from another person to put on and taking off regular lower body clothing?: A Lot 6 Click Score: 16    End of Session    OT Visit Diagnosis: Unsteadiness on feet (R26.81);Other abnormalities of gait and mobility (R26.89);Muscle weakness (generalized) (M62.81);Pain   Activity Tolerance Treatment limited secondary to agitation   Patient Left in bed;with call bell/phone within reach;with family/visitor present   Nurse Communication Other (comment)(pt and GF frustration over staff bothering them./)        Time: 8413-2440 OT Time Calculation (min): 16 min  Charges: OT General  Charges $OT Visit: 1 Visit OT Treatments $Self Care/Home Management : 8-22 mins  Kari Baars, [place] [phone] Office- [phone], Edwena Felty D 10/19/2018, 1:26 PM

## 2018-10-19 NOTE — Progress Notes (Signed)
Ryan Medina still has not found a living situation that will take him.  He seems to be doing pretty well.  He has had no obvious bleeding.  There has been good pain control.  His right foot still bothers him from the arterial embolus that he had.  I think this will always be some issue for him.  He is eating quite well.  He has had no nausea or vomiting.  No labs are back yet from today.  Yesterday, his iron studies showed a serum iron of 29 which was low.  I will go ahead and give him another dose of IV iron.  He finishes radiation last week.  I would not anticipate any type of chemotherapy to start for another couple weeks.  There is been no melena or bright red blood per rectum.  All his vital signs look stable.  His blood pressure is 129/87.  Temperature 98.5.  Pulse is 84.  His oral exam shows no thrush.  Lungs are clear bilaterally.  Cardiac exam regular rate and rhythm.  He has no murmurs rubs or bruits.  Abdomen is soft.  Bowel sounds present.  He has no guarding or rebound tenderness.  Extremities shows no clubbing, cyanosis or edema.  Not sure when he is going to be placed.  We will give a dose of IV iron today.  We will check his labs tomorrow.   [name], MD  Psalm 147:8

## 2018-10-19 NOTE — Plan of Care (Signed)
Pt stable this pm. No s/s of distress. Pt resting in bed with family at bedside. Pt denies acute pain at this time, he was medicated for chronic pain. No changes needed to care plans.

## 2018-10-19 NOTE — Progress Notes (Signed)
PROGRESS NOTE  Ryan Medina  WCB:[id number] DOB: [birth year] DOA: 09/03/2018 PCP: Patient, No Pcp Per  Outpatient Specialists: Oncology, Dr. Marin Olp (new patient) Brief Narrative: Ryan Medina is a 58 y.o. male admitted with LLE pain found to have ischemic right leg and acute popliteal and tibial embolus s/p embolectomy 12/13. On heparin gtt. Developed acute RLL hematoma with compartment syndrome requiring fasciotomy 12/15. Decompensated 12/17 am with hypotension with abd distention and 2 massive bloody stools. Hgb drop 9.5 ->5.9. Getting 2 units PRBC, protonix, and GI consulted. Moved to ICU for hemodynamic instability. Had embolization of GDA 09/07/2018. Transferred out of ICU 12/18.  Transferred back to ICU 1/5 overnight for hypotension and recurrent anemia. He has been given a total of 26 units of PRBCs due to ongoing GI blood loss from duodenal ulceration from local invasion of pancreatic cancer. He was ultiamtely transferred to [facility] for initiation of radiation therapy. Hemoglobin has stabilized with resolution of clinical bleeding. He has since completed radiation treatments and had port inserted. His significant medical comorbidities are complicated by an adverse social situation, i.e. homelessness. With functional impairments in mobility, the safest place to discharge would be to a skilled nursing facility to improve strength to return to prior level of functioning. Unfortunately, due to cocaine positivity on admission he has been declined by local SNF's. The only other proposed disposition has been to a shelter which is not a stable enough environment for him in this current clinical state.  Assessment & Plan: Principal Problem:   Popliteal artery embolus (HCC) Active Problems:   DM (diabetes mellitus), type 2, uncontrolled ([place])   Essential hypertension   Dilated cardiomyopathy (HCC)   Chest pain   AAA (abdominal aortic aneurysm) (HCC)   Ischemic foot   Right leg  claudication (HCC)   PVD (peripheral vascular disease) ([place])   Hemorrhagic shock ([place])   Nontraumatic ischemic infarction of muscle of right lower leg   Upper GI bleed   Cocaine abuse ([place])   Acute blood loss anemia   Chronic pain syndrome   Duodenal ulcer with hemorrhage   Pancreatic cancer ([place])   Goals of care, counseling/discussion   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 Inoperable, but has undergone XRT.  - Dr. Marin Olp following patient, anticipating initiation of abraxane/gemcitabine as an outpatient. Had port placed 1/21. The patient has no transportation and would require assistance to present to frequent office visits.   Acute hemorrhagic shock secondary to acute GI bleed, acute blood loss anemia, iron deficiency anemia: Shock resolved, GI bleeding stable, improved, due to duodenal ulcer from pancreatic CA invasion. Underwent EGD/hemospray 09/07/2018, GDA embolization 09/07/2018, and underwent radiation therapy with the goal of stopping bleeding. Has received, per report 26u PRBCs throughout hospitalization (last was 1/23). s/p IV iron per hematology/oncology, repeated 1/23.  -Continue PPI twice daily. Patient confirms willingness to take this. -GI feels ongoing slow bleeding is likely to continue, will need very close CBC monitoring.  Sepsis likely secondary to HCAP and pseudomonas aeruginosa UTIs/p vancomycin and cefepime: Resolved.  Recurrent fever 1/19: In the absence of leukocytosis or localizing  symptoms/signs. CXR improved aeration. Repeat  urine culture negative. Blood cultures negative. PICC pulled 1/26. - If re-fevers, would recommend repeat blood cultures and monitoring. Would have higher threshold during transfusions.  Ischemic right foot due to right popliteal and tibial artery embolus: s/p embolectomy and bovine pericardial patch angioplasty of popliteal artery and posterior tibial artery 09/03/2018 by Dr. Scot Dock. Also had recurrent right tibial embolus in the setting of stopping anticoagulation due to significant GI bleeding s/p embolectomy 09/10/2018 at the time of fasciotomy closure. - Currently stable. Continue pain control as ordered, no indication for IV or liquid pain medications. Needs ongoing PT, has significant debility related to this which puts him at very high risk of readmission were he to attempt to thrive at a homeless shelter. - Statin. Not on anticoagulation/antiplatelet due to GI bleeding.  Chronic combined HFrEF: - Continue coreg. This is a high risk medication when outpatient due to cocaine use.  - Would recommend lasix coadministration with future PRBCs. - Not on ACE/ARB due to hx anaphylaxis with lisinopril.  Hypokalemia:  - Repeat supplementation and recheck intermittently.  Cocaine use: +UDS in Dec 2019.  - Cessation counseling provided.    Possible SBO: Resolved. - Stop laxatives, continue prn miralax.  Poorly-controlled T2DM: HbA1c 18.2% Dec 2019 (average ~467m/dl). Had hypoglycemic episode 1/24. - Continue novolog SSI, and mealtime to 3u and stopped lantus. Will permit some hyperglycemia due to dietary indiscretions to avoid hypoglycemia. Regardless, control is much better than previously.   Severe protein calorie malnutrition:  - Protein supplementation as able.  DVT prophylaxis: SCDs Code Status: Partial code (only desired intubation when last asked) Family Communication: Fiance at bedside Disposition Plan: Requires discharge to SNF, medically stable for that disposition. Not  safe to be discharged to homeless shelter.   Consultants:   Oncology  IR  GI  Palliative care medicine team  PCCM  Vascular surgery  Procedures:   EGD 09/07/2018 by Dr. MRush Landmark  Impression:       - No gross lesions in proximal esophagus. LA Grade                            D esophagitis in middle and distal esophagus..                           - Clotted blood in the entire stomach. J-shaped                            deformity in the gastric body.                           - One oozing duodenal ulcer with an adherent clot                            (Forrest Class IIb). There is no evidence of                            perforation. Attempts at removal of the clot were                            not successful. Without family around, and his  clinical stability, decision made to not remove the                            clot because of concern for significant hemodynamic                            compromise. Hemospray was applied in effort to                            decrease the risk of persistent bleeding while                            awaiting next steps in evaluation/therapy as                            outlined below.                           - No gross lesions in the second portion of the                            duodenum. Recommendation: The patient will be observed post-procedure,                            until all discharge criteria are met.                           - Return care to ICU.                           - Discussed case with ICU and with Interventional                            Radiology. Overall, high concern for risk of                            rebleeding, in setting of active oozing after just                            movement of small amount of clot at the base - thus                            consistent with still active bleeding. This is too                            large of an area for other  endoscopic therapies                            other than use of Hemospray which was used today.                            Next step in evaluation should be a mesenteric  angiogram and likely GDA embolization to decrease                            risk as much as possible of severe hemorrhage again.                           - Do not place NGT for the next few days due to                            severe esophagitis.                           - Continue IV PPI drip.                           - Send H. pylori serology and treat if positive.                           - The findings and recommendations were discussed                            with the referring physician.  Upper EUS 09/16/2018 by Dr. Ardis Hughs:  Impression:       - 3.6cm by 2.1cm mass in the neck of pancreas that                            surrounds the celiac trunk, obstructs the main                            pancreatic duct and appears to have also invaded                            the duodenal bulb created a large, malignant,                            friable ulcer. I biopsied the abnormal, firm mucosa                            surrounding the duodenal ulcer and sampled the                            pancreatic mass with transgastric EUS FNA.                            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  Recommendation: Return patient to hospital [facility] for ongoing care.                           - He has recieved 13 units of blood this admission,  3 of them in the past 24 hours. He will continue to                            ooze from the duodenal ulcer that I suspect is                            malignant, related to the nearby large pancreatic                            mass. Judicious use of bloodthinners is strongly                            recommended.                           -  Await final cytology/pathology reports; both                            hopefully available tomorrow.  Subjective: Irritable this morning. Doesn't want to take flomax, senna. Ok with taking other medications. No constipation or diarrhea. No issues urinating.    Objective: Vitals:   10/18/18 2328 10/19/18 0542 10/19/18 0800 10/19/18 1310  BP: (!) 122/93 129/87 (!) 133/95 (!) 119/96  Pulse:  84 85 88  Resp:  _0 Temp:  98.5 F (36.9 C) 99.1 F (37.3 C) 99.4 F (37.4 C)  TempSrc:  Oral Oral Oral  SpO2:  97% 99% 99%  Weight:      Height:        Intake/Output Summary (Last 24 hours) at 10/19/2018 1541 Last data filed at 10/19/2018 1310 Gross per 24 hour  Intake 1200 ml  Output 550 ml  Net 650 ml   Filed Weights   09/28/18 1500 09/28/18 1914 10/11/18 1057  Weight: 59 kg 57.6 kg 54.7 kg   57yo M in no distress, laying in bed quietly RRR no JVD or edema Clear, nonlabored  Data Reviewed: I have personally reviewed following labs and imaging studies  CBC: Recent Labs  Lab 10/13/18 1051 10/14/18 0321 10/15/18 0500 10/16/18 0318 10/18/18 0521  WBC 7.3  --  9.8 11.8* 11.0*  HGB 7.8* 7.8* 10.2* 10.3* 9.6*  HCT 25.2* 25.1* 32.3* 32.4* 29.7*  MCV 91.0  --  89.5 87.1 90.3  PLT 437*  --  538* 478* 726   Basic Metabolic Panel: Recent Labs  Lab 10/13/18 1051 10/14/18 0321 10/15/18 0500 10/16/18 0318 10/18/18 0521  NA 136  --  134* 131* 133*  K 3.6  --  3.4* 3.9 3.3*  CL 102  --  97* 94* 96*  CO2 26  --  _1 GLUCOSE 102*  --  43* 205* 266*  BUN 12  --  _2 CREATININE 0.51* 0.39* 0.36* 0.48* 0.42*  CALCIUM 7.2*  --  7.9* 7.6* 7.6*   CBG: Recent Labs  Lab 10/18/18 1109 10/18/18 1729 10/18/18 2307 10/19/18 0732 10/19/18 1123  GLUCAP 196* 108* 162* 163* 285*   Anemia Panel: Recent Labs    10/18/18 0842  FERRITIN 1,128*  TIBC 117*  IRON 29*   Urine analysis:    Component Value Date/Time   COLORURINE YELLOW 10/10/2018 [place] 10/10/2018  2303   LABSPEC 1.022 10/10/2018 2303   PHURINE 6.0 10/10/2018 2303   GLUCOSEU >=500 (A) 10/10/2018 2303   HGBUR NEGATIVE 10/10/2018 2303   BILIRUBINUR NEGATIVE 10/10/2018 2303   KETONESUR NEGATIVE 10/10/2018 2303   PROTEINUR NEGATIVE 10/10/2018 2303   UROBILINOGEN 4.0 (H) 12/05/2016 0921   NITRITE NEGATIVE 10/10/2018 2303   LEUKOCYTESUR NEGATIVE 10/10/2018 2303   Recent Results (from the past 240 hour(s))  Culture, blood (routine x 2)     Status: None   Collection Time: 10/10/18  5:13 PM  Result Value Ref Range Status   Specimen Description   Final    BLOOD LEFT ANTECUBITAL Performed at [facility], [place] [address] [place], [place] [address]    Special Requests   Final    BOTTLES DRAWN AEROBIC AND ANAEROBIC Blood Culture adequate volume Performed at [facility] [address], [place], [place] [address]    Culture   Final    NO GROWTH 5 DAYS Performed at [facility], [place] [address], [place], [place] [address]    Report Status 10/15/2018 FINAL  Final  Culture, blood (routine x 2)     Status: None   Collection Time: 10/10/18  5:16 PM  Result Value Ref Range Status   Specimen Description   Final    BLOOD BLOOD LEFT ARM Performed at [facility] [address] [place], [place] [address]    Special Requests   Final    BOTTLES DRAWN AEROBIC ONLY Blood Culture adequate volume Performed at [facility] [address], [place], [place] [address]    Culture   Final    NO GROWTH 5 DAYS Performed at [facility], [place] [address] [place], [place] [address]    Report Status 10/15/2018 FINAL  Final      Radiology Studies: No results found.  Scheduled Meds: . atorvastatin  20 mg Oral QHS  . carvedilol  3.125 mg Oral BID WC  . dronabinol  5 mg Oral BID AC  . feeding supplement (ENSURE ENLIVE)  237 mL Oral BID BM  . gabapentin  400 mg Oral TID PC & HS  . insulin aspart   0-9 Units Subcutaneous TID AC & HS  . insulin aspart  3 Units Subcutaneous TID WC  . morphine  15 mg Oral Q12H  . pantoprazole  40 mg Oral BID  . sodium chloride flush  10-40 mL Intracatheter Q12H   Continuous Infusions: . ferumoxytol       LOS: 46 days   Time spent: 15 minutes.  Patrecia Pour, MD [facility] [email or web address] Password [facility] 10/19/2018, 3:41 PM

## 2018-10-20 ENCOUNTER — Ambulatory Visit: Payer: Self-pay

## 2018-10-20 LAB — COMPREHENSIVE METABOLIC PANEL
ALT: 13 U/L (ref 0–44)
AST: 17 U/L (ref 15–41)
Albumin: 1.3 g/dL — ABNORMAL LOW (ref 3.5–5.0)
Alkaline Phosphatase: 66 U/L (ref 38–126)
Anion gap: 8 (ref 5–15)
BUN: 6 mg/dL (ref 6–20)
CHLORIDE: 97 mmol/L — AB (ref 98–111)
CO2: 26 mmol/L (ref 22–32)
Calcium: 7.3 mg/dL — ABNORMAL LOW (ref 8.9–10.3)
Creatinine, Ser: 0.46 mg/dL — ABNORMAL LOW (ref 0.61–1.24)
GFR calc Af Amer: >60 mL/min (ref >60)
GFR calc non Af Amer: >60 mL/min (ref >60)
Glucose, Bld: 273 mg/dL — ABNORMAL HIGH (ref 70–99)
POTASSIUM: 3.2 mmol/L — AB (ref 3.5–5.1)
Sodium: 131 mmol/L — ABNORMAL LOW (ref 135–145)
Total Bilirubin: 0.6 mg/dL (ref 0.3–1.2)
Total Protein: 5.7 g/dL — ABNORMAL LOW (ref 6.5–8.1)

## 2018-10-20 LAB — CBC
HCT: 27.8 % — ABNORMAL LOW (ref 39.0–52.0)
Hemoglobin: 8.7 g/dL — ABNORMAL LOW (ref 13.0–17.0)
MCH: 28.1 pg (ref 26.0–34.0)
MCHC: 31.3 g/dL (ref 30.0–36.0)
MCV: 89.7 fL (ref 80.0–100.0)
Platelets: 377 10*3/uL (ref 150–400)
RBC: 3.1 MIL/uL — ABNORMAL LOW (ref 4.22–5.81)
RDW: 14 % (ref 11.5–15.5)
WBC: 10.9 10*3/uL — ABNORMAL HIGH (ref 4.0–10.5)
nRBC: 0 % (ref 0.0–0.2)

## 2018-10-20 LAB — GLUCOSE, CAPILLARY
GLUCOSE-CAPILLARY: 269 mg/dL — AB (ref 70–99)
Glucose-Capillary: 191 mg/dL — ABNORMAL HIGH (ref 70–99)
Glucose-Capillary: 78 mg/dL (ref 70–99)
Glucose-Capillary: 16 mg/dL — CL (ref 70–99)
Glucose-Capillary: 141 mg/dL — ABNORMAL HIGH (ref 70–99)

## 2018-10-20 MED ORDER — DEXTROSE 50 % IV SOLN
INTRAVENOUS | Status: AC
  Administered 2018-10-20: 50 mL via INTRAVENOUS
  Filled 2018-10-20: qty 50

## 2018-10-20 MED ORDER — POTASSIUM CHLORIDE CRYS ER 20 MEQ PO TBCR
40.0000 meq | EXTENDED_RELEASE_TABLET | Freq: Two times a day (BID) | ORAL | Status: AC
  Administered 2018-10-20: 40 meq via ORAL
  Filled 2018-10-20: qty 2

## 2018-10-20 MED ORDER — GLUCOSE 40 % PO GEL
ORAL | Status: AC
  Administered 2018-10-20: 37.5 g
  Filled 2018-10-20: qty 1

## 2018-10-20 MED ORDER — INSULIN DETEMIR 100 UNIT/ML ~~LOC~~ SOLN
4.0000 [IU] | Freq: Every day | SUBCUTANEOUS | Status: DC
  Administered 2018-10-21 – 2018-11-07 (×17): 4 [IU] via SUBCUTANEOUS
  Filled 2018-10-20 (×19): qty 0.04

## 2018-10-20 MED ORDER — GLUCOSE 40 % PO GEL: 1.0000 | Freq: Once | ORAL | Status: AC

## 2018-10-20 MED ORDER — INSULIN DETEMIR 100 UNIT/ML ~~LOC~~ SOLN
7.0000 [IU] | Freq: Every day | SUBCUTANEOUS | Status: DC
  Administered 2018-10-20: 7 [IU] via SUBCUTANEOUS
  Filled 2018-10-20: qty 0.07

## 2018-10-20 MED ORDER — DEXTROSE 50 % IV SOLN
50.0000 mL | Freq: Once | INTRAVENOUS | Status: AC
  Administered 2018-10-20: 50 mL via INTRAVENOUS

## 2018-10-20 NOTE — Progress Notes (Signed)
Still looks like there is no place for Mr. Lipford to go to.  I know that social work is trying her best to get him a place to stay.  I noticed that his hemoglobin keeps dropping slowly but surely.  He says he is having no obvious bleeding.  There is no melena or bright red blood per rectum.  His hemoglobin today is 8.7.  5 days ago it was 10.3.  His iron studies are okay.  As such, I do not think he needs any IV iron.  I will put him on some folic acid.  This might help a little bit.  His blood sugars are still quite high.  His renal function is doing okay.  Liver function studies are all right.  His albumin is incredibly low.  He says he is eating okay.  We really need to try to work on getting the albumin better.  He only complains of some pain in the right foot.  I think he will always have some element of neuropathy from the arterial embolus that he had when he initially presented back in December.  His vital signs are all stable.  There really is no change in his physical exam.  We will have to see what his labs look like tomorrow.  If his hemoglobin is lower, then we probably will have to end up transfusing him again.  I very much appreciate everybody's help with Mr. Sahlin.  I know that it is a challenge to find a place for him to stay.  [name], MD  Psalm 91:1-2

## 2018-10-20 NOTE — Progress Notes (Signed)
Inpatient Diabetes Program Recommendations  AACE/ADA: New Consensus Statement on Inpatient Glycemic Control (2015)  Target Ranges:  Prepandial:   less than 140 mg/dL      Peak postprandial:   less than 180 mg/dL (1-2 hours)      Critically ill patients:  140 - 180 mg/dL   Lab Results  Component Value Date   GLUCAP 269 (H) 10/20/2018   HGBA1C 18.2 (H) 08/23/2018    Review of Glycemic Control    Inpatient Diabetes Program Recommendations:   Insulin - Basal: Noted Lantus was discontinued due to hypoglycemia. Glucose up to 269 mg/dl this morning. If glucose continues to be greater than 180 mg/dl, please consider ordering Levemir 7 units Q24H (based on 66.7 kg x 0.1 units). Correction (SSI): May want to consider changing CBGs and Novolog frequency to Q4H for closer monitoring and glycemic control.  Continue to follow.   Thank you. Lorenda Peck, RD, LDN, CDE Inpatient Diabetes Coordinator [phone]

## 2018-10-20 NOTE — Progress Notes (Addendum)
Hypoglycemic Event  CBG: 16  Treatment: 1/2 tube glucose gel & 1 amp D50  Symptoms: lethargic, cold, diaphoretic, slurred speech  Follow-up CBG: Time:2130 CBG Result:141  Possible Reasons for Event: pt slept thru dinner at 5pm   Comments/MD notified:Amion text msg sent to [place], [place] 2050- pt awoke enough to start eating his dinner ( fruit & cheese platter)     Larence Penning

## 2018-10-20 NOTE — Progress Notes (Signed)
PROGRESS NOTE    Ryan Medina  ZOX:[id number] DOB: September 26, 1960 DOA: 09/03/2018 PCP: Patient, No Pcp Per    Brief Narrative:  58 y.o. male admitted with LLE pain found to have ischemic right leg and acute popliteal and tibial embolus s/p embolectomy 12/13. On heparin gtt. Developed acute RLL hematoma with compartment syndrome requiring fasciotomy 12/15. Decompensated 12/17 am with hypotension with abd distention and 2 massive bloody stools. Hgb drop 9.5 ->5.9. Getting 2 units PRBC, protonix, and GI consulted. Moved to ICU for hemodynamic instability. Had embolization of GDA 09/07/2018. Transferred out of ICU 12/18.  Transferred back to ICU 1/5 overnight for hypotension and recurrent anemia. He has been given a total of 26 units of PRBCs due to ongoing GI blood loss from duodenal ulceration from local invasion of pancreatic cancer. He was ultiamtely transferred to [facility] for initiation of radiation therapy. Hemoglobin has stabilized with resolution of clinical bleeding. He has since completed radiation treatments and had port inserted. His significant medical comorbidities are complicated by an adverse social situation, i.e. homelessness. With functional impairments in mobility, the safest place to discharge would be to a skilled nursing facility to improve strength to return to prior level of functioning. Unfortunately, due to cocaine positivity on admission he has been declined by local SNF's. The only other proposed disposition has been to a shelter which is not a stable enough environment for him in this current clinical state.  Assessment & Plan:   Principal Problem:   Popliteal artery embolus (HCC) Active Problems:   DM (diabetes mellitus), type 2, uncontrolled ([place])   Essential hypertension   Dilated cardiomyopathy (HCC)   Chest pain   AAA (abdominal aortic aneurysm) (HCC)   Ischemic foot   Right leg claudication (HCC)   PVD (peripheral vascular disease) ([place])   Hemorrhagic  shock ([place])   Nontraumatic ischemic infarction of muscle of right lower leg   Upper GI bleed   Cocaine abuse ([place])   Acute blood loss anemia   Chronic pain syndrome   Duodenal ulcer with hemorrhage   Pancreatic cancer ([place])   Goals of care, counseling/discussion   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 Inoperable, but has undergone XRT.  - Dr. Marin Olp following patient, anticipating initiation of abraxane/gemcitabine as an outpatient. Had port placed 1/21.  - The patient has no transportation and would require assistance to present to frequent office visits.  - Stable at present  Acute hemorrhagic shock secondary to acute GI bleed, acute blood loss anemia, iron deficiency anemia:  - Shock resolved, GI bleeding stable, improved, due to duodenal ulcer from pancreatic CA invasion.  - Underwent EGD/hemospray 09/07/2018, GDA embolization 09/07/2018, and underwent radiation therapy with the goal of stopping bleeding.  - Has received, per report 26u PRBCs throughout hospitalization (last was 1/23). s/p IV iron per hematology/oncology, repeated 1/23.  -Continue PPI twice daily. Patient confirms willingness to take this. -GI following.  Sepsis likely secondary to HCAP and pseudomonas aeruginosa UTIs/p vancomycin and cefepime:  - Resolved. -Recurrent fever 1/19: In the absence of leukocytosis or localizing symptoms/signs. CXR improved  aeration. Repeat urine culture negative. Blood cultures negative. PICC pulled 1/26. - Stable at present  Ischemic right  foot due to right popliteal and tibial artery embolus:  - s/p embolectomy and bovine pericardial patch angioplasty of popliteal artery and posterior tibial artery 09/03/2018 by Dr. Scot Dock. Also had recurrent right tibial embolus in the setting of stopping anticoagulation due to significant GI bleeding s/p embolectomy 09/10/2018 at the time of fasciotomy closure. - Currently stable. Continue pain control as ordered, no indication for IV or liquid pain medications.  - Needs ongoing PT, has significant debility related to this which puts him at very high risk of readmission were he to attempt to thrive at a homeless shelter. - Continued on statin. Not on anticoagulation/antiplatelet due to hx of GI bleeding.  Chronic combined HFrEF: - Continue coreg. This is a high risk medication when outpatient due to cocaine use.  - Would recommend lasix coadministration with future PRBCs. - Currently not on ACE/ARB due to hx anaphylaxis with lisinopril.  Hypokalemia:  - Repeat supplementation and recheck intermittently. - Will repeat bmet in AM  Cocaine use: +UDS in Dec 2019.  - Cessation counseling provided.    Possible SBO: Resolved. - Stop laxatives, continue prn miralax - Currently stable.  Poorly-controlled T2DM: HbA1c 18.2% Dec 2019 (average ~432m/dl). Had hypoglycemic episode 1/24. - Continue novolog SSI, and mealtime to 3u and stopped lantus. Will permit some hyperglycemia due to dietary indiscretions to avoid hypoglycemia. Regardless, control is much better than previously.  - Presently stable  Severe protein calorie malnutrition:  - Protein supplementation as able.  DVT prophylaxis: SCD's Code Status: No CPR, no ACLS, OK for intubation Family Communication: Pt in room, family at bedside Disposition Plan: Pending SNF  Consultants:   Oncology  IR  GI  Palliative care medicine team  PCCM  Vascular surgery  Procedures:   EGD 09/07/2018 by Dr. MRush Landmark  Impression:  - No gross lesions in proximal esophagus. LA Grade  D esophagitis in middle and distal esophagus.. - Clotted blood in the entire stomach. J-shaped  deformity in the gastric body. - One oozing duodenal ulcer with an adherent clot  (Forrest Class IIb). There is no evidence of  perforation. Attempts at removal of the clot were  not successful. Without family around, and his  clinical stability, decision made to not remove the  clot because of concern for significant hemodynamic  compromise. Hemospray was applied in effort to  decrease the risk of persistent bleeding while  awaiting next steps in evaluation/therapy as  outlined below. - No gross lesions in the second portion of the  duodenum. Recommendation: The patient will be observed post-procedure,  until all discharge criteria are met. - Return care to ICU. - Discussed case with ICU and with Interventional  Radiology. Overall, high concern for risk of  rebleeding, in setting of active oozing after just  movement of small amount of clot at the base - thus  consistent with still active bleeding. This is too  large of an area for other endoscopic therapies  other than use of Hemospray which was used today.  Next step in evaluation should be a mesenteric  angiogram and  likely GDA embolization to decrease  risk as much as possible of severe hemorrhage again. - Do not place NGT for the next few days due to  severe esophagitis. - Continue IV PPI drip. - Send H. pylori serology and treat if positive. - The findings and recommendations were discussed  with the referring  physician.  Upper EUS 09/16/2018 by Dr. Ardis Hughs:  Impression:  - 3.6cm by 2.1cm mass in the neck of pancreas that  surrounds the celiac trunk, obstructs the main  pancreatic duct and appears to have also invaded  the duodenal bulb created a large, malignant,  friable ulcer. I biopsied the abnormal, firm mucosa  surrounding the duodenal ulcer and sampled the  pancreatic mass with transgastric EUS FNA.  Preliminary cytology review from the pancreatic  mass was positive for malignancy.  Recommendation: Return patient to hospital [facility] for ongoing care. - He has recieved 13 units of blood this admission,  3 of them in the past 24 hours. He will continue to  ooze from the duodenal ulcer that I suspect is  malignant, related to the nearby large pancreatic  mass. Judicious use of bloodthinners is strongly  recommended. - Await final cytology/pathology reports; both  hopefully available tomorrow.   Antimicrobials: Anti-infectives (From admission, onward)   Start     Dose/Rate Route Frequency Ordered Stop    10/11/18 2300  vancomycin (VANCOCIN) IVPB 750 mg/150 ml premix  Status:  Discontinued     750 mg 150 mL/hr over 60 Minutes Intravenous Every 12 hours 10/11/18 1037 10/13/18 1954   10/11/18 1500  ceFAZolin (ANCEF) IVPB 2g/100 mL premix     2 g 200 mL/hr over 30 Minutes Intravenous To Radiology 10/08/18 1348 10/12/18 1500   10/11/18 1200  ceFEPIme (MAXIPIME) 1 g in sodium chloride 0.9 % 100 mL IVPB  Status:  Discontinued     1 g 200 mL/hr over 30 Minutes Intravenous Every 8 hours 10/11/18 1037 10/13/18 1954   10/11/18 1045  vancomycin (VANCOCIN) 1,250 mg in sodium chloride 0.9 % 250 mL IVPB     1,250 mg 166.7 mL/hr over 90 Minutes Intravenous  Once 10/11/18 1037 10/11/18 2057   10/07/18 1600  ceFEPIme (MAXIPIME) 1 g in sodium chloride 0.9 % 100 mL IVPB     1 g 200 mL/hr over 30 Minutes Intravenous Every 8 hours 10/07/18 1452 10/08/18 0105   10/06/18 1000  fluconazole (DIFLUCAN) tablet 100 mg  Status:  Discontinued     100 mg Oral Daily 10/06/18 0709 10/11/18 0834   10/02/18 2100  vancomycin (VANCOCIN) IVPB 750 mg/150 ml premix  Status:  Discontinued     750 mg 150 mL/hr over 60 Minutes Intravenous Every 12 hours 10/02/18 0812 10/04/18 1420   10/02/18 0900  vancomycin (VANCOCIN) 1,250 mg in sodium chloride 0.9 % 250 mL IVPB     1,250 mg 166.7 mL/hr over 90 Minutes Intravenous  Once 10/02/18 0812 10/02/18 1132   10/02/18 0900  ceFEPIme (MAXIPIME) 1 g in sodium chloride 0.9 % 100 mL IVPB  Status:  Discontinued     1 g 200 mL/hr over 30 Minutes Intravenous Every 8 hours 10/02/18 0812 10/07/18 0925   10/02/18 0815  ceFEPIme (MAXIPIME) 1 g in sodium chloride 0.9 % 100 mL IVPB  Status:  Discontinued     1 g 200 mL/hr over 30 Minutes Intravenous Every 8 hours 10/02/18 0800 10/02/18 0812   10/02/18 0800  ceFEPIme (MAXIPIME) 2 g in sodium chloride 0.9 % 100 mL IVPB  Status:  Discontinued     2 g 200 mL/hr over 30 Minutes Intravenous  Once 10/02/18 0757 10/02/18 0800   10/02/18 0800   metroNIDAZOLE (FLAGYL) IVPB 500 mg  Status:  Discontinued     500 mg 100 mL/hr over 60 Minutes Intravenous Every 8 hours 10/02/18 0757 10/02/18 0800  10/02/18 0800  vancomycin (VANCOCIN) IVPB 1000 mg/200 mL premix  Status:  Discontinued     1,000 mg 200 mL/hr over 60 Minutes Intravenous  Once 10/02/18 0757 10/02/18 0800   09/27/18 0000  ceFAZolin (ANCEF) IVPB 2g/100 mL premix     2 g 200 mL/hr over 30 Minutes Intravenous To Radiology 09/25/18 0902 09/28/18 0000   09/10/18 2130  ceFAZolin (ANCEF) IVPB 2g/100 mL premix     2 g 200 mL/hr over 30 Minutes Intravenous Every 8 hours 09/10/18 1634 09/11/18 0700   09/10/18 0800  ceFAZolin (ANCEF) IVPB 1 g/50 mL premix    Note to Pharmacy:  Send with pt to OR   1 g 100 mL/hr over 30 Minutes Intravenous On call 09/09/18 0756 09/10/18 1407   09/07/18 1300  erythromycin 250 mg in sodium chloride 0.9 % 100 mL IVPB     250 mg 100 mL/hr over 60 Minutes Intravenous Once 09/07/18 1125 09/07/18 2351   09/06/18 0000  ceFAZolin (ANCEF) IVPB 1 g/50 mL premix    Note to Pharmacy:  Send with pt to OR   1 g 100 mL/hr over 30 Minutes Intravenous On call 09/05/18 0849 09/05/18 0952   09/03/18 2245  ceFAZolin (ANCEF) IVPB 2g/100 mL premix     2 g 200 mL/hr over 30 Minutes Intravenous Every 8 hours 09/03/18 2236 09/04/18 1444   09/03/18 1615  ceFAZolin (ANCEF) IVPB 2g/100 mL premix  Status:  Discontinued     2 g 200 mL/hr over 30 Minutes Intravenous On call to O.R. 09/03/18 1600 09/03/18 2035       Subjective: Without complaints today  Objective: Vitals:   10/19/18 2323 10/20/18 0000 10/20/18 0618 10/20/18 1406  BP:   (!) 127/97 (!) 165/110  Pulse:   89 91  Resp:    18  Temp: (!) 100.7 F (38.2 C) 98.1 F (36.7 C) 98.4 F (36.9 C) 99.2 F (37.3 C)  TempSrc: Oral Axillary Oral Oral  SpO2:   100% 99%  Weight:      Height:        Intake/Output Summary (Last 24 hours) at 10/20/2018 1843 Last data filed at 10/20/2018 0518 Gross per 24 hour    Intake 1200 ml  Output 1700 ml  Net -500 ml   Filed Weights   09/28/18 1500 09/28/18 1914 10/11/18 1057  Weight: 59 kg 57.6 kg 54.7 kg    Examination:  General exam: Appears calm and comfortable  Respiratory system: Clear to auscultation. Respiratory effort normal. Cardiovascular system: S1 & S2 heard, RRR Gastrointestinal system: Abdomen is nondistended, soft and nontender. No organomegaly or masses felt. Normal bowel sounds heard. Central nervous system: Alert and oriented. No focal neurological deficits. Extremities: Symmetric 5 x 5 power. Skin: No rashes, lesions Psychiatry: Judgement and insight appear normal. Mood & affect appropriate.   Data Reviewed: I have personally reviewed following labs and imaging studies  CBC: Recent Labs  Lab 10/14/18 0321 10/15/18 0500 10/16/18 0318 10/18/18 0521 10/20/18 0448  WBC  --  9.8 11.8* 11.0* 10.9*  HGB 7.8* 10.2* 10.3* 9.6* 8.7*  HCT 25.1* 32.3* 32.4* 29.7* 27.8*  MCV  --  89.5 87.1 90.3 89.7  PLT  --  538* 478* 395 326   Basic Metabolic Panel: Recent Labs  Lab 10/14/18 0321 10/15/18 0500 10/16/18 0318 10/18/18 0521 10/20/18 0448  NA  --  134* 131* 133* 131*  K  --  3.4* 3.9 3.3* 3.2*  CL  --  97* 94* 96* 97*  CO2  --  _0 GLUCOSE  --  43* 205* 266* 273*  BUN  --  _1 CREATININE 0.39* 0.36* 0.48* 0.42* 0.46*  CALCIUM  --  7.9* 7.6* 7.6* 7.3*   GFR: Estimated Creatinine Clearance: 78.8 mL/min (A) (by C-G formula based on SCr of 0.46 mg/dL (L)). Liver Function Tests: Recent Labs  Lab 10/20/18 0448  AST 17  ALT 13  ALKPHOS 66  BILITOT 0.6  PROT 5.7*  ALBUMIN 1.3*   No results for input(s): LIPASE, AMYLASE in the last 168 hours. No results for input(s): AMMONIA in the last 168 hours. Coagulation Profile: No results for input(s): INR, PROTIME in the last 168 hours. Cardiac Enzymes: No results for input(s): CKTOTAL, CKMB, CKMBINDEX, TROPONINI in the last 168 hours. BNP (last 3 results) No  results for input(s): PROBNP in the last 8760 hours. HbA1C: No results for input(s): HGBA1C in the last 72 hours. CBG: Recent Labs  Lab 10/19/18 1620 10/19/18 2135 10/20/18 0751 10/20/18 1130 10/20/18 1627  GLUCAP 345* 163* 269* 191* 78   Lipid Profile: No results for input(s): CHOL, HDL, LDLCALC, TRIG, CHOLHDL, LDLDIRECT in the last 72 hours. Thyroid Function Tests: No results for input(s): TSH, T4TOTAL, FREET4, T3FREE, THYROIDAB in the last 72 hours. Anemia Panel: Recent Labs    10/18/18 0842  FERRITIN 1,128*  TIBC 117*  IRON 29*   Sepsis Labs: No results for input(s): PROCALCITON, LATICACIDVEN in the last 168 hours.  No results found for this or any previous visit (from the past 240 hour(s)).   Radiology Studies: No results found.  Scheduled Meds: . atorvastatin  20 mg Oral QHS  . carvedilol  3.125 mg Oral BID WC  . dronabinol  5 mg Oral BID AC  . feeding supplement (ENSURE ENLIVE)  237 mL Oral BID BM  . gabapentin  400 mg Oral TID PC & HS  . insulin aspart  0-9 Units Subcutaneous TID AC & HS  . insulin aspart  3 Units Subcutaneous TID WC  . insulin detemir  7 Units Subcutaneous Daily  . morphine  15 mg Oral Q12H  . pantoprazole  40 mg Oral BID  . potassium chloride  40 mEq Oral BID  . sodium chloride flush  10-40 mL Intracatheter Q12H   Continuous Infusions:   LOS: 47 days   Marylu Lund, MD [facility] Pager On Amion  If 7PM-7AM, please contact night-coverage 10/20/2018, 6:43 PM

## 2018-10-21 ENCOUNTER — Ambulatory Visit: Payer: Self-pay

## 2018-10-21 LAB — CBC WITH DIFFERENTIAL/PLATELET
Abs Immature Granulocytes: 0.07 10*3/uL (ref 0.00–0.07)
Basophils Absolute: 0 10*3/uL (ref 0.0–0.1)
Basophils Relative: 0 %
EOS ABS: 0 10*3/uL (ref 0.0–0.5)
Eosinophils Relative: 0 %
HEMATOCRIT: 29.5 % — AB (ref 39.0–52.0)
Hemoglobin: 9.5 g/dL — ABNORMAL LOW (ref 13.0–17.0)
Immature Granulocytes: 1 %
LYMPHS ABS: 0.9 10*3/uL (ref 0.7–4.0)
Lymphocytes Relative: 7 %
MCH: 29.1 pg (ref 26.0–34.0)
MCHC: 32.2 g/dL (ref 30.0–36.0)
MCV: 90.2 fL (ref 80.0–100.0)
Monocytes Absolute: 0.9 10*3/uL (ref 0.1–1.0)
Monocytes Relative: 7 %
Neutro Abs: 10.2 10*3/uL — ABNORMAL HIGH (ref 1.7–7.7)
Neutrophils Relative %: 85 %
Platelets: 373 10*3/uL (ref 150–400)
RBC: 3.27 MIL/uL — ABNORMAL LOW (ref 4.22–5.81)
RDW: 13.7 % (ref 11.5–15.5)
WBC: 12.1 10*3/uL — ABNORMAL HIGH (ref 4.0–10.5)
nRBC: 0 % (ref 0.0–0.2)

## 2018-10-21 LAB — COMPREHENSIVE METABOLIC PANEL
ALT: 13 U/L (ref 0–44)
AST: 14 U/L — ABNORMAL LOW (ref 15–41)
Albumin: 1.3 g/dL — ABNORMAL LOW (ref 3.5–5.0)
Alkaline Phosphatase: 76 U/L (ref 38–126)
Anion gap: 9 (ref 5–15)
BUN: 6 mg/dL (ref 6–20)
CALCIUM: 7.8 mg/dL — AB (ref 8.9–10.3)
CO2: 29 mmol/L (ref 22–32)
Chloride: 95 mmol/L — ABNORMAL LOW (ref 98–111)
Creatinine, Ser: 0.38 mg/dL — ABNORMAL LOW (ref 0.61–1.24)
GFR calc Af Amer: >60 mL/min (ref >60)
GFR calc non Af Amer: >60 mL/min (ref >60)
Glucose, Bld: 156 mg/dL — ABNORMAL HIGH (ref 70–99)
Potassium: 3.8 mmol/L (ref 3.5–5.1)
SODIUM: 133 mmol/L — AB (ref 135–145)
Total Bilirubin: 0.3 mg/dL (ref 0.3–1.2)
Total Protein: 6.1 g/dL — ABNORMAL LOW (ref 6.5–8.1)

## 2018-10-21 LAB — GLUCOSE, CAPILLARY
Glucose-Capillary: 199 mg/dL — ABNORMAL HIGH (ref 70–99)
Glucose-Capillary: 292 mg/dL — ABNORMAL HIGH (ref 70–99)
Glucose-Capillary: 170 mg/dL — ABNORMAL HIGH (ref 70–99)
Glucose-Capillary: 151 mg/dL — ABNORMAL HIGH (ref 70–99)

## 2018-10-21 MED ORDER — ENSURE ENLIVE PO LIQD
237.0000 mL | Freq: Two times a day (BID) | ORAL | Status: DC
  Administered 2018-10-21 – 2018-11-01 (×18): 237 mL via ORAL

## 2018-10-21 MED ORDER — BOOST / RESOURCE BREEZE PO LIQD CUSTOM
1.0000 | Freq: Two times a day (BID) | ORAL | Status: DC
  Administered 2018-10-25 – 2018-11-01 (×10): 1 via ORAL

## 2018-10-21 NOTE — Progress Notes (Signed)
Nutrition Follow-up  DOCUMENTATION CODES:   Severe malnutrition in context of chronic illness, Underweight  INTERVENTION:  - Continue Ensure Enlive BID and Magic Cup once/day. - Will also trial Boost Breeze BID, each supplement provides 250 kcal and 9 grams of protein. - Continue to encourage PO intakes.    NUTRITION DIAGNOSIS:   Severe Malnutrition related to chronic illness(Pancreatic cancer, DM and CHF) as evidenced by severe muscle depletion, severe fat depletion, percent weight loss. -ongoing  GOAL:   Patient will meet greater than or equal to 90% of their needs -unmet on average.   MONITOR:   PO intake, Supplement acceptance, Weight trends, Labs  ASSESSMENT:   57 y.o male with PMH: recent dx pancreatic cancer, DM, CHF, crack cocaine use, emboli of legs, smoker. Pt admitted with admitted with DKA and GI bleed.   Weight continues to trend down and is now -26 lb compared to admission weight. Per order review, patient has accepted nearly all bottles of Ensure since RD visit on 1/24. He reports that appetite has slowly been improving. At the time of visit 1/24 he had mainly been eating bites-25% of meals, now eating 25-50% of meals.   Palliative Care is following and last saw patient this AM. Plan is for SNF; pending facility to accept patient. Patient was homeless PTA.     Medications reviewed; sliding scale novolog, 4 units levemir/day, 40 mg oral protonix BID, 5 mg marinol BID. Labs reviewed; CBG: 199 mg/dl today, Na: 133 mmol/l, Cl: 95 mmol/l, creatinine: 0.38 mg/dl, Ca: 7.8 mg/dl. IVF; D5 @ 50 ml/hr (204 kcal).     Diet Order:   Diet Order            Diet regular Room service appropriate? Yes; Fluid consistency: Thin  Diet effective now              EDUCATION NEEDS:   Not appropriate for education at this time  Skin:  Skin Assessment: Reviewed RN Assessment  Last BM:  1/29  Height:   Ht Readings from Last 1 Encounters:  09/28/18 5\' 11"  (1.803 m)     Weight:   Wt Readings from Last 1 Encounters:  10/11/18 54.7 kg    Ideal Body Weight:  78 kg  BMI:  Body mass index is 16.82 kg/m.  Estimated Nutritional Needs:   Kcal:  2000-2200 kcal  Protein:  100-110 grams  Fluid:  >/= 2L/day     Jarome Matin, MS, RD, LDN, CNSC Inpatient Clinical Dietitian Pager # [phone] After hours/weekend pager # [phone]

## 2018-10-21 NOTE — Progress Notes (Signed)
PROGRESS NOTE    Kasson Lamere Furlough  IOX:[id number] DOB: 1960-11-21 DOA: 09/03/2018 PCP: Patient, No Pcp Per    Brief Narrative:  58 y.o. male admitted with LLE pain found to have ischemic right leg and acute popliteal and tibial embolus s/p embolectomy 12/13. On heparin gtt. Developed acute RLL hematoma with compartment syndrome requiring fasciotomy 12/15. Decompensated 12/17 am with hypotension with abd distention and 2 massive bloody stools. Hgb drop 9.5 ->5.9. Getting 2 units PRBC, protonix, and GI consulted. Moved to ICU for hemodynamic instability. Had embolization of GDA 09/07/2018. Transferred out of ICU 12/18.  Transferred back to ICU 1/5 overnight for hypotension and recurrent anemia. He has been given a total of 26 units of PRBCs due to ongoing GI blood loss from duodenal ulceration from local invasion of pancreatic cancer. He was ultiamtely transferred to [facility] for initiation of radiation therapy. Hemoglobin has stabilized with resolution of clinical bleeding. He has since completed radiation treatments and had port inserted. His significant medical comorbidities are complicated by an adverse social situation, i.e. homelessness. With functional impairments in mobility, the safest place to discharge would be to a skilled nursing facility to improve strength to return to prior level of functioning. Unfortunately, due to cocaine positivity on admission he has been declined by local SNF's. The only other proposed disposition has been to a shelter which is not a stable enough environment for him in this current clinical state.  Assessment & Plan:   Principal Problem:   Popliteal artery embolus (HCC) Active Problems:   DM (diabetes mellitus), type 2, uncontrolled ([place])   Essential hypertension   Dilated cardiomyopathy (HCC)   Chest pain   AAA (abdominal aortic aneurysm) (HCC)   Ischemic foot   Right leg claudication (HCC)   PVD (peripheral vascular disease) ([place])   Hemorrhagic  shock ([place])   Nontraumatic ischemic infarction of muscle of right lower leg   Upper GI bleed   Cocaine abuse ([place])   Acute blood loss anemia   Chronic pain syndrome   Duodenal ulcer with hemorrhage   Pancreatic cancer ([place])   Goals of care, counseling/discussion   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 Inoperable, but has undergone XRT.  - Dr. Marin Olp following patient, anticipating initiation of abraxane/gemcitabine as an outpatient. Had port placed 1/21.  - The patient has no transportation and would require assistance to present to frequent office visits.  - Remains stable  Acute hemorrhagic shock secondary to acute GI bleed, acute blood loss anemia, iron deficiency anemia:  - Shock resolved, GI bleeding stable, improved, due to duodenal ulcer from pancreatic CA invasion.  - Underwent EGD/hemospray 09/07/2018, GDA embolization 09/07/2018, and underwent radiation therapy with the goal of stopping bleeding.  - Has received, per report 26u PRBCs throughout hospitalization (last was 1/23). s/p IV iron per hematology/oncology, repeated 1/23.  -Continue PPI twice daily. Patient confirms willingness to take this. -GI following.  Sepsis likely secondary to HCAP and pseudomonas aeruginosa UTIs/p vancomycin and cefepime:  - Resolved. -Recurrent fever 1/19: In the absence of leukocytosis or localizing symptoms/signs. CXR improved aeration.  Repeat urine culture negative. Blood cultures negative. PICC pulled 1/26. - Stable at present  Ischemic right foot  due to right popliteal and tibial artery embolus:  - s/p embolectomy and bovine pericardial patch angioplasty of popliteal artery and posterior tibial artery 09/03/2018 by Dr. Scot Dock. Also had recurrent right tibial embolus in the setting of stopping anticoagulation due to significant GI bleeding s/p embolectomy 09/10/2018 at the time of fasciotomy closure. - Currently stable. Continue pain control as ordered, no indication for IV or liquid pain medications.  - Needs ongoing PT, has significant debility related to this which puts him at very high risk of readmission were he to attempt to thrive at a homeless shelter. - Continued on statin. Not on anticoagulation/antiplatelet due to hx of GI bleeding.  Chronic combined HFrEF: - Continue coreg. This is a high risk medication when outpatient due to cocaine use.  - Would recommend lasix coadministration with future PRBCs. - Currently not on ACE/ARB due to hx anaphylaxis with lisinopril.  Hypokalemia:  - Repeat supplementation and recheck intermittently. - Will repeat bmet in AM  Cocaine use: +UDS in Dec 2019.  - Cessation counseling provided.    Possible SBO: Resolved. - Stop laxatives, continue prn miralax - Currently stable.  Poorly-controlled T2DM: HbA1c 18.2% Dec 2019 (average ~'475mg'$ /dl). Had hypoglycemic episode 1/24. - Continue novolog SSI,  -Had resumed lantus, noted to be hypoglycemic overnight. Lantus dose decreased. Will continue regimen for now  Severe protein calorie malnutrition:  - Protein supplementation as able.  DVT prophylaxis: SCD's Code Status: No CPR, no ACLS, OK for intubation Family Communication: Pt in room, family at bedside Disposition Plan: Pending SNF  Consultants:   Oncology  IR  GI  Palliative care medicine team  PCCM  Vascular surgery  Procedures:   EGD 09/07/2018 by Dr. Rush Landmark:  Impression: - No gross lesions in proximal esophagus. LA Grade  D  esophagitis in middle and distal esophagus.. - Clotted blood in the entire stomach. J-shaped  deformity in the gastric body. - One oozing duodenal ulcer with an adherent clot  ([place] Class IIb). There is no evidence of  perforation. Attempts at removal of the clot were  not successful. Without family around, and his  clinical stability, decision made to not remove the  clot because of concern for significant hemodynamic  compromise. Hemospray was applied in effort to  decrease the risk of persistent bleeding while  awaiting next steps in evaluation/therapy as  outlined below. - No gross lesions in the second portion of the  duodenum. Recommendation: The patient will be observed post-procedure,  until all discharge criteria are met. - Return care to ICU. - Discussed case with ICU and with Interventional  Radiology. Overall, high concern for risk of  rebleeding, in setting of active oozing after just  movement of small amount of clot at the base - thus  consistent with still active bleeding. This is too  large of an area for other endoscopic therapies  other than use of Hemospray which was used today.  Next step in evaluation should be a mesenteric  angiogram and likely GDA embolization to decrease  risk as much as possible of  severe hemorrhage again. - Do not place NGT for the next few days due to  severe esophagitis. - Continue IV PPI drip. - Send H. pylori serology and treat if positive. - The findings and recommendations were discussed  with the referring physician.  Upper EUS 09/16/2018 by Dr. Ardis Hughs:  Impression:  - 3.6cm  by 2.1cm mass in the neck of pancreas that  surrounds the celiac trunk, obstructs the main  pancreatic duct and appears to have also invaded  the duodenal bulb created a large, malignant,  friable ulcer. I biopsied the abnormal, firm mucosa  surrounding the duodenal ulcer and sampled the  pancreatic mass with transgastric EUS FNA.  Preliminary cytology review from the pancreatic  mass was positive for malignancy.  Recommendation: Return patient to hospital [facility] for ongoing care. - He has recieved 13 units of blood this admission,  3 of them in the past 24 hours. He will continue to  ooze from the duodenal ulcer that I suspect is  malignant, related to the nearby large pancreatic  mass. Judicious use of bloodthinners is strongly  recommended. - Await final cytology/pathology reports; both  hopefully available tomorrow.   Antimicrobials: Anti-infectives (From admission, onward)   Start     Dose/Rate Route Frequency Ordered Stop   10/11/18 2300  vancomycin (VANCOCIN) IVPB 750 mg/150 ml premix  Status:  Discontinued       750 mg 150 mL/hr over 60 Minutes Intravenous Every 12 hours 10/11/18 1037 10/13/18 1954   10/11/18 1500  ceFAZolin (ANCEF) IVPB 2g/100 mL premix     2 g 200 mL/hr over 30 Minutes Intravenous To Radiology 10/08/18 1348 10/12/18 1500   10/11/18 1200  ceFEPIme (MAXIPIME) 1 g in sodium chloride 0.9 % 100 mL IVPB  Status:  Discontinued     1 g 200 mL/hr over 30 Minutes Intravenous Every 8 hours 10/11/18 1037 10/13/18 1954   10/11/18 1045  vancomycin (VANCOCIN) 1,250 mg in sodium chloride 0.9 % 250 mL IVPB     1,250 mg 166.7 mL/hr over 90 Minutes Intravenous  Once 10/11/18 1037 10/11/18 2057   10/07/18 1600  ceFEPIme (MAXIPIME) 1 g in sodium chloride 0.9 % 100 mL IVPB     1 g 200 mL/hr over 30 Minutes Intravenous Every 8 hours 10/07/18 1452 10/08/18 0105   10/06/18 1000  fluconazole (DIFLUCAN) tablet 100 mg  Status:  Discontinued     100 mg Oral Daily 10/06/18 0709 10/11/18 0834   10/02/18 2100  vancomycin (VANCOCIN) IVPB 750 mg/150 ml premix  Status:  Discontinued     750 mg 150 mL/hr over 60 Minutes Intravenous Every 12 hours 10/02/18 0812 10/04/18 1420   10/02/18 0900  vancomycin (VANCOCIN) 1,250 mg in sodium chloride 0.9 % 250 mL IVPB     1,250 mg 166.7 mL/hr over 90 Minutes Intravenous  Once 10/02/18 0812 10/02/18 1132   10/02/18 0900  ceFEPIme (MAXIPIME) 1 g in sodium chloride 0.9 % 100 mL IVPB  Status:  Discontinued     1 g 200 mL/hr over 30 Minutes Intravenous Every 8 hours 10/02/18 0812 10/07/18 0925   10/02/18 0815  ceFEPIme (MAXIPIME) 1 g in sodium chloride 0.9 % 100 mL IVPB  Status:  Discontinued     1 g 200 mL/hr over 30 Minutes Intravenous Every 8 hours 10/02/18 0800 10/02/18 0812   10/02/18 0800  ceFEPIme (MAXIPIME) 2 g in sodium chloride 0.9 % 100 mL IVPB  Status:  Discontinued     2 g 200 mL/hr over 30 Minutes Intravenous  Once 10/02/18 0757 10/02/18 0800   10/02/18 0800  metroNIDAZOLE (FLAGYL) IVPB 500 mg  Status:  Discontinued     500 mg 100 mL/hr over 60 Minutes  Intravenous Every 8 hours 10/02/18 0757 10/02/18 0800   10/02/18 0800  vancomycin (VANCOCIN) IVPB 1000 mg/200 mL premix  Status:  Discontinued     1,000 mg 200 mL/hr over 60 Minutes Intravenous  Once 10/02/18 0757 10/02/18 0800   09/27/18 0000  ceFAZolin (ANCEF) IVPB 2g/100 mL premix     2 g 200 mL/hr over 30 Minutes Intravenous To Radiology 09/25/18 0902 09/28/18 0000   09/10/18 2130  ceFAZolin (ANCEF) IVPB 2g/100 mL premix     2 g 200 mL/hr over 30 Minutes Intravenous Every 8 hours 09/10/18 1634 09/11/18 0700   09/10/18 0800  ceFAZolin (ANCEF) IVPB 1 g/50 mL premix    Note to Pharmacy:  Send with pt to OR   1 g 100 mL/hr over 30 Minutes Intravenous On call 09/09/18 0756 09/10/18 1407   09/07/18 1300  erythromycin 250 mg in sodium chloride 0.9 % 100 mL IVPB     250 mg 100 mL/hr over 60 Minutes Intravenous Once 09/07/18 1125 09/07/18 2351   09/06/18 0000  ceFAZolin (ANCEF) IVPB 1 g/50 mL premix    Note to Pharmacy:  Send with pt to OR   1 g 100 mL/hr over 30 Minutes Intravenous On call 09/05/18 0849 09/05/18 0952   09/03/18 2245  ceFAZolin (ANCEF) IVPB 2g/100 mL premix     2 g 200 mL/hr over 30 Minutes Intravenous Every 8 hours 09/03/18 2236 09/04/18 1444   09/03/18 1615  ceFAZolin (ANCEF) IVPB 2g/100 mL premix  Status:  Discontinued     2 g 200 mL/hr over 30 Minutes Intravenous On call to O.R. 09/03/18 1600 09/03/18 2035      Subjective: No complaints currently . In good spirits  Objective: Vitals:   10/20/18 2025 10/20/18 2134 10/21/18 0414 10/21/18 1503  BP: (!) 152/113 111/84 (!) 117/91 128/89  Pulse: (!) 51 68 80 83  Resp: _0 Temp:   98.3 F (36.8 C) 99.2 F (37.3 C)  TempSrc:   Oral Oral  SpO2: 100% 100% 99% 98%  Weight:      Height:        Intake/Output Summary (Last 24 hours) at 10/21/2018 1805 Last data filed at 10/21/2018 1700 Gross per 24 hour  Intake 480 ml  Output 1650 ml  Net -1170 ml   Filed Weights   09/28/18 1500 09/28/18 1914 10/11/18  1057  Weight: 59 kg 57.6 kg 54.7 kg    Examination: General exam: Awake, laying in bed, in nad Respiratory system: Normal respiratory effort, no wheezing Cardiovascular system: regular rate, s1, s2 Gastrointestinal system: Soft, nondistended, positive BS Central nervous system: CN2-12 grossly intact, strength intact Extremities: Perfused, no clubbing Skin: Normal skin turgor, no notable skin lesions seen Psychiatry: Mood normal // no visual hallucinations    Data Reviewed: I have personally reviewed following labs and imaging studies  CBC: Recent Labs  Lab 10/15/18 0500 10/16/18 0318 10/18/18 0521 10/20/18 0448 10/21/18 0409  WBC 9.8 11.8* 11.0* 10.9* 12.1*  NEUTROABS  --   --   --   --  10.2*  HGB 10.2* 10.3* 9.6* 8.7* 9.5*  HCT 32.3* 32.4* 29.7* 27.8* 29.5*  MCV 89.5 87.1 90.3 89.7 90.2  PLT 538* 478* 395 377 151   Basic Metabolic Panel: Recent Labs  Lab 10/15/18 0500 10/16/18 0318 10/18/18 0521 10/20/18 0448 10/21/18 0409  NA 134* 131* 133* 131* 133*  K 3.4* 3.9 3.3* 3.2* 3.8  CL 97* 94* 96* 97* 95*  CO2 _1 GLUCOSE 43* 205* 266* 273* 156*  BUN _2 CREATININE 0.36* 0.48* 0.42*  0.46* 0.38*  CALCIUM 7.9* 7.6* 7.6* 7.3* 7.8*   GFR: Estimated Creatinine Clearance: 78.8 mL/min (A) (by C-G formula based on SCr of 0.38 mg/dL (L)). Liver Function Tests: Recent Labs  Lab 10/20/18 0448 10/21/18 0409  AST 17 14*  ALT 13 13  ALKPHOS 66 76  BILITOT 0.6 0.3  PROT 5.7* 6.1*  ALBUMIN 1.3* 1.3*   No results for input(s): LIPASE, AMYLASE in the last 168 hours. No results for input(s): AMMONIA in the last 168 hours. Coagulation Profile: No results for input(s): INR, PROTIME in the last 168 hours. Cardiac Enzymes: No results for input(s): CKTOTAL, CKMB, CKMBINDEX, TROPONINI in the last 168 hours. BNP (last 3 results) No results for input(s): PROBNP in the last 8760 hours. HbA1C: No results for input(s): HGBA1C in the last 72  hours. CBG: Recent Labs  Lab 10/20/18 2024 10/20/18 2130 10/21/18 0736 10/21/18 1157 10/21/18 1708  GLUCAP 16* 141* 199* 292* 170*   Lipid Profile: No results for input(s): CHOL, HDL, LDLCALC, TRIG, CHOLHDL, LDLDIRECT in the last 72 hours. Thyroid Function Tests: No results for input(s): TSH, T4TOTAL, FREET4, T3FREE, THYROIDAB in the last 72 hours. Anemia Panel: No results for input(s): VITAMINB12, FOLATE, FERRITIN, TIBC, IRON, RETICCTPCT in the last 72 hours. Sepsis Labs: No results for input(s): PROCALCITON, LATICACIDVEN in the last 168 hours.  No results found for this or any previous visit (from the past 240 hour(s)).   Radiology Studies: No results found.  Scheduled Meds: . atorvastatin  20 mg Oral QHS  . carvedilol  3.125 mg Oral BID WC  . dronabinol  5 mg Oral BID AC  . feeding supplement  1 Container Oral BID BM  . feeding supplement (ENSURE ENLIVE)  237 mL Oral BID BM  . gabapentin  400 mg Oral TID PC & HS  . insulin aspart  0-9 Units Subcutaneous TID AC & HS  . insulin detemir  4 Units Subcutaneous Daily  . morphine  15 mg Oral Q12H  . pantoprazole  40 mg Oral BID  . sodium chloride flush  10-40 mL Intracatheter Q12H   Continuous Infusions:   LOS: 48 days   Marylu Lund, MD [facility] Pager On Amion  If 7PM-7AM, please contact night-coverage 10/21/2018, 6:05 PM

## 2018-10-21 NOTE — Progress Notes (Signed)
Patient ID: Ryan Medina, male   DOB: February 21, 1961, 58 y.o.   MRN: [id number]  This NP visited patient at the bedside as a follow up for palliative medicine needs and emotional support.  Patient significant other is at the bedside, Ryan Medina is alert and oriented.    Patient reports that his pain is controlled with current medication regime, no complaints voiced.   Patient verbalizes an understanding of the seriousness of his disease but remains hopeful for improvement.     Created space and opportunity for Ryan Medina to continue to explore his thoughts and feelings about his current medical situation.  He is hopeful to initiate  Oncology/chemotherapy once he is placed, his goal is continued quality of life.    Emotional support offered        Questions and concerns addressed   Recommend palliative services as OP once placed       Palliative medicine team will continue to support holistically.  Total time spent on the unit was 15  minutes  Greater than 50% of the time was spent in counseling and coordination of care  Ryan Lessen NP  Palliative Medicine Team Team Phone # [phone] Pager [phone]

## 2018-10-21 NOTE — Progress Notes (Signed)
PT Cancellation Note  Patient Details Name: Ryan Medina MRN: [id number] DOB: Jul 26, 1961   Cancelled Treatment:    Reason Eval/Treat Not Completed: Attempted PT tx sesison-pt declined participation with PT at this time. Pt and visitor stated he has been trying to put more weight through R LE when ambulating. He also stated he has been practicing ROM exercises. Will continue to check back with pt to progress activity as he will allow.    Weston Anna, PT Acute Rehabilitation Services Pager: [phone] Office: [phone]

## 2018-10-22 ENCOUNTER — Ambulatory Visit: Payer: Self-pay

## 2018-10-22 LAB — GLUCOSE, CAPILLARY
GLUCOSE-CAPILLARY: 167 mg/dL — AB (ref 70–99)
Glucose-Capillary: 181 mg/dL — ABNORMAL HIGH (ref 70–99)
Glucose-Capillary: 215 mg/dL — ABNORMAL HIGH (ref 70–99)
Glucose-Capillary: 172 mg/dL — ABNORMAL HIGH (ref 70–99)

## 2018-10-22 NOTE — Progress Notes (Signed)
Clinical Social Worker following patient for support and discharge needs. CSW has been working with AD of Social Work to place patient into a SNF for rehab. At this time the only facility that has a bed that could take patient is [place] but at this time facility is unable to make a bed offer. AD of Social Work stated he will continue to follow up with the administrator of [facility] to continue to try and place patient into facility.    Rhea Pink, MSW,  [place]

## 2018-10-22 NOTE — Progress Notes (Signed)
PROGRESS NOTE    Ryan Medina  YKD:[id number] DOB: 11-27-1960 DOA: 09/03/2018 PCP: Patient, No Pcp Per    Brief Narrative:  58 y.o. male admitted with LLE pain found to have ischemic right leg and acute popliteal and tibial embolus s/p embolectomy 12/13. On heparin gtt. Developed acute RLL hematoma with compartment syndrome requiring fasciotomy 12/15. Decompensated 12/17 am with hypotension with abd distention and 2 massive bloody stools. Hgb drop 9.5 ->5.9. Getting 2 units PRBC, protonix, and GI consulted. Moved to ICU for hemodynamic instability. Had embolization of GDA 09/07/2018. Transferred out of ICU 12/18.  Transferred back to ICU 1/5 overnight for hypotension and recurrent anemia. He has been given a total of 26 units of PRBCs due to ongoing GI blood loss from duodenal ulceration from local invasion of pancreatic cancer. He was ultiamtely transferred to [facility] for initiation of radiation therapy. Hemoglobin has stabilized with resolution of clinical bleeding. He has since completed radiation treatments and had port inserted. His significant medical comorbidities are complicated by an adverse social situation, i.e. homelessness. With functional impairments in mobility, the safest place to discharge would be to a skilled nursing facility to improve strength to return to prior level of functioning. Unfortunately, due to cocaine positivity on admission he has been declined by local SNF's. The only other proposed disposition has been to a shelter which is not a stable enough environment for him in this current clinical state.  Assessment & Plan:   Principal Problem:   Popliteal artery embolus (HCC) Active Problems:   DM (diabetes mellitus), type 2, uncontrolled ([place])   Essential hypertension   Dilated cardiomyopathy (HCC)   Chest pain   AAA (abdominal aortic aneurysm) (HCC)   Ischemic foot   Right leg claudication (HCC)   PVD (peripheral vascular disease) ([place])   Hemorrhagic  shock ([place])   Nontraumatic ischemic infarction of muscle of right lower leg   Upper GI bleed   Cocaine abuse ([place])   Acute blood loss anemia   Chronic pain syndrome   Duodenal ulcer with hemorrhage   Pancreatic cancer ([place])   Goals of care, counseling/discussion   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 Inoperable, but has undergone XRT.  - Dr. Marin Olp following patient, anticipating initiation of abraxane/gemcitabine as an outpatient. Had port placed 1/21.  - The patient has no transportation and would require assistance to present to frequent office visits.  - Currently stable  Acute hemorrhagic shock secondary to acute GI bleed, acute blood loss anemia, iron deficiency anemia:  - Shock resolved, GI bleeding stable, improved, due to duodenal ulcer from pancreatic CA invasion.  - Underwent EGD/hemospray 09/07/2018, GDA embolization 09/07/2018, and underwent radiation therapy with the goal of stopping bleeding.  - Has received, per report 26u PRBCs throughout hospitalization (last was 1/23). s/p IV iron per hematology/oncology, repeated 1/23.  -Continue PPI twice daily. Patient confirms willingness to take this. -GI following.  Sepsis likely secondary to HCAP and pseudomonas aeruginosa UTIs/p vancomycin and cefepime:  - Resolved. -Recurrent fever 1/19: In the absence of leukocytosis or localizing symptoms/signs. CXR improved aeration.  Repeat urine culture negative. Blood cultures negative. PICC pulled 1/26. - Stable at present  Ischemic right  foot due to right popliteal and tibial artery embolus:  - s/p embolectomy and bovine pericardial patch angioplasty of popliteal artery and posterior tibial artery 09/03/2018 by Dr. Scot Dock. Also had recurrent right tibial embolus in the setting of stopping anticoagulation due to significant GI bleeding s/p embolectomy 09/10/2018 at the time of fasciotomy closure. - Currently stable. Continue pain control as ordered, no indication for IV or liquid pain medications.  - Needs ongoing PT, has significant debility related to this which puts him at very high risk of readmission were he to attempt to thrive at a homeless shelter. - Continued on statin. Not on anticoagulation/antiplatelet due to hx of GI bleeding.  Chronic combined HFrEF: - Continue coreg. This is a high risk medication when outpatient due to cocaine use.  - Would recommend lasix coadministration with future PRBCs. - Currently not on ACE/ARB due to hx anaphylaxis with lisinopril.  Hypokalemia:  - Repeat supplementation and recheck intermittently. - Will repeat bmet in AM  Cocaine use: +UDS in Dec 2019.  - Cessation counseling provided.    Possible SBO: Resolved. - Stop laxatives, continue prn miralax - Currently stable.  Poorly-controlled T2DM: HbA1c 18.2% Dec 2019 (average ~434m/dl). Had hypoglycemic episode 1/24. - Continue novolog SSI,  -Had resumed lantus, noted to be hypoglycemic overnight. Lantus dose decreased. Will continue regimen for now  Severe protein calorie malnutrition:  - Protein supplementation as able.  DVT prophylaxis: SCD's Code Status: No CPR, no ACLS, OK for intubation Family Communication: Pt in room, family at bedside Disposition Plan: Pending SNF  Consultants:   Oncology  IR  GI  Palliative care medicine team  PCCM  Vascular surgery  Procedures:   EGD 09/07/2018 by Dr. MRush Landmark  Impression: - No gross lesions in proximal esophagus. LA Grade    D esophagitis in middle and distal esophagus.. - Clotted blood in the entire stomach. J-shaped  deformity in the gastric body. - One oozing duodenal ulcer with an adherent clot  (Forrest Class IIb). There is no evidence of  perforation. Attempts at removal of the clot were  not successful. Without family around, and his  clinical stability, decision made to not remove the  clot because of concern for significant hemodynamic  compromise. Hemospray was applied in effort to  decrease the risk of persistent bleeding while  awaiting next steps in evaluation/therapy as  outlined below. - No gross lesions in the second portion of the  duodenum. Recommendation: The patient will be observed post-procedure,  until all discharge criteria are met. - Return care to ICU. - Discussed case with ICU and with Interventional  Radiology. Overall, high concern for risk of  rebleeding, in setting of active oozing after just  movement of small amount of clot at the base - thus  consistent with still active bleeding. This is too  large of an area for other endoscopic therapies  other than use of Hemospray which was used today.  Next step in evaluation should be a mesenteric  angiogram and likely GDA embolization to decrease   risk as much as possible of severe hemorrhage again. - Do not place NGT for the next few days due to  severe esophagitis. - Continue IV PPI drip. - Send H. pylori serology and treat if positive. - The findings and recommendations were discussed  with the referring physician.  Upper EUS 09/16/2018 by Dr. JArdis Hughs  Impression:  -  3.6cm by 2.1cm mass in the neck of pancreas that  surrounds the celiac trunk, obstructs the main  pancreatic duct and appears to have also invaded  the duodenal bulb created a large, malignant,  friable ulcer. I biopsied the abnormal, firm mucosa  surrounding the duodenal ulcer and sampled the  pancreatic mass with transgastric EUS FNA.  Preliminary cytology review from the pancreatic  mass was positive for malignancy.  Recommendation: Return patient to hospital [facility] for ongoing care. - He has recieved 13 units of blood this admission,  3 of them in the past 24 hours. He will continue to  ooze from the duodenal ulcer that I suspect is  malignant, related to the nearby large pancreatic  mass. Judicious use of bloodthinners is strongly  recommended. - Await final cytology/pathology reports; both  hopefully available tomorrow.   Antimicrobials: Anti-infectives (From admission, onward)   Start     Dose/Rate Route Frequency Ordered Stop   10/11/18 2300  vancomycin  (VANCOCIN) IVPB 750 mg/150 ml premix  Status:  Discontinued     750 mg 150 mL/hr over 60 Minutes Intravenous Every 12 hours 10/11/18 1037 10/13/18 1954   10/11/18 1500  ceFAZolin (ANCEF) IVPB 2g/100 mL premix     2 g 200 mL/hr over 30 Minutes Intravenous To Radiology 10/08/18 1348 10/12/18 1500   10/11/18 1200  ceFEPIme (MAXIPIME) 1 g in sodium chloride 0.9 % 100 mL IVPB  Status:  Discontinued     1 g 200 mL/hr over 30 Minutes Intravenous Every 8 hours 10/11/18 1037 10/13/18 1954   10/11/18 1045  vancomycin (VANCOCIN) 1,250 mg in sodium chloride 0.9 % 250 mL IVPB     1,250 mg 166.7 mL/hr over 90 Minutes Intravenous  Once 10/11/18 1037 10/11/18 2057   10/07/18 1600  ceFEPIme (MAXIPIME) 1 g in sodium chloride 0.9 % 100 mL IVPB     1 g 200 mL/hr over 30 Minutes Intravenous Every 8 hours 10/07/18 1452 10/08/18 0105   10/06/18 1000  fluconazole (DIFLUCAN) tablet 100 mg  Status:  Discontinued     100 mg Oral Daily 10/06/18 0709 10/11/18 0834   10/02/18 2100  vancomycin (VANCOCIN) IVPB 750 mg/150 ml premix  Status:  Discontinued     750 mg 150 mL/hr over 60 Minutes Intravenous Every 12 hours 10/02/18 0812 10/04/18 1420   10/02/18 0900  vancomycin (VANCOCIN) 1,250 mg in sodium chloride 0.9 % 250 mL IVPB     1,250 mg 166.7 mL/hr over 90 Minutes Intravenous  Once 10/02/18 0812 10/02/18 1132   10/02/18 0900  ceFEPIme (MAXIPIME) 1 g in sodium chloride 0.9 % 100 mL IVPB  Status:  Discontinued     1 g 200 mL/hr over 30 Minutes Intravenous Every 8 hours 10/02/18 0812 10/07/18 0925   10/02/18 0815  ceFEPIme (MAXIPIME) 1 g in sodium chloride 0.9 % 100 mL IVPB  Status:  Discontinued     1 g 200 mL/hr over 30 Minutes Intravenous Every 8 hours 10/02/18 0800 10/02/18 0812   10/02/18 0800  ceFEPIme (MAXIPIME) 2 g in sodium chloride 0.9 % 100 mL IVPB  Status:  Discontinued     2 g 200 mL/hr over 30 Minutes Intravenous  Once 10/02/18 0757 10/02/18 0800   10/02/18 0800  metroNIDAZOLE (FLAGYL) IVPB 500 mg   Status:  Discontinued     500 mg 100 mL/hr over 60 Minutes Intravenous Every 8 hours 10/02/18 0757 10/02/18 0800   10/02/18 0800  vancomycin (VANCOCIN) IVPB 1000 mg/200 mL premix  Status:  Discontinued     1,000 mg 200 mL/hr over 60 Minutes Intravenous  Once 10/02/18 0757 10/02/18 0800   09/27/18 0000  ceFAZolin (ANCEF) IVPB 2g/100 mL premix     2 g 200 mL/hr over 30 Minutes Intravenous To Radiology 09/25/18 0902 09/28/18 0000   09/10/18 2130  ceFAZolin (ANCEF) IVPB 2g/100 mL premix     2 g 200 mL/hr over 30 Minutes Intravenous Every 8 hours 09/10/18 1634 09/11/18 0700   09/10/18 0800  ceFAZolin (ANCEF) IVPB 1 g/50 mL premix    Note to Pharmacy:  Send with pt to OR   1 g 100 mL/hr over 30 Minutes Intravenous On call 09/09/18 0756 09/10/18 1407   09/07/18 1300  erythromycin 250 mg in sodium chloride 0.9 % 100 mL IVPB     250 mg 100 mL/hr over 60 Minutes Intravenous Once 09/07/18 1125 09/07/18 2351   09/06/18 0000  ceFAZolin (ANCEF) IVPB 1 g/50 mL premix    Note to Pharmacy:  Send with pt to OR   1 g 100 mL/hr over 30 Minutes Intravenous On call 09/05/18 0849 09/05/18 0952   09/03/18 2245  ceFAZolin (ANCEF) IVPB 2g/100 mL premix     2 g 200 mL/hr over 30 Minutes Intravenous Every 8 hours 09/03/18 2236 09/04/18 1444   09/03/18 1615  ceFAZolin (ANCEF) IVPB 2g/100 mL premix  Status:  Discontinued     2 g 200 mL/hr over 30 Minutes Intravenous On call to O.R. 09/03/18 1600 09/03/18 2035      Subjective: No issues  Objective: Vitals:   10/21/18 1503 10/21/18 2017 10/22/18 0549 10/22/18 1408  BP: 128/89 (!) 136/94 128/89 119/87  Pulse: 83 86 85 96  Resp: _0 Temp: 99.2 F (37.3 C) 100 F (37.8 C) (!) 97.3 F (36.3 C) 99.5 F (37.5 C)  TempSrc: Oral Oral Oral Oral  SpO2: 98% 100% 100% 98%  Weight:      Height:        Intake/Output Summary (Last 24 hours) at 10/22/2018 1844 Last data filed at 10/22/2018 1300 Gross per 24 hour  Intake 720 ml  Output 825 ml  Net  -105 ml   Filed Weights   09/28/18 1500 09/28/18 1914 10/11/18 1057  Weight: 59 kg 57.6 kg 54.7 kg    Examination: General exam: Laying in bed, in no acute distress Respiratory system: normal chest rise, clear, no audible wheezing  Data Reviewed: I have personally reviewed following labs and imaging studies  CBC: Recent Labs  Lab 10/16/18 0318 10/18/18 0521 10/20/18 0448 10/21/18 0409  WBC 11.8* 11.0* 10.9* 12.1*  NEUTROABS  --   --   --  10.2*  HGB 10.3* 9.6* 8.7* 9.5*  HCT 32.4* 29.7* 27.8* 29.5*  MCV 87.1 90.3 89.7 90.2  PLT 478* 395 377 203   Basic Metabolic Panel: Recent Labs  Lab 10/16/18 0318 10/18/18 0521 10/20/18 0448 10/21/18 0409  NA 131* 133* 131* 133*  K 3.9 3.3* 3.2* 3.8  CL 94* 96* 97* 95*  CO2 _1 GLUCOSE 205* 266* 273* 156*  BUN _2 CREATININE 0.48* 0.42* 0.46* 0.38*  CALCIUM 7.6* 7.6* 7.3* 7.8*   GFR: Estimated Creatinine Clearance: 78.8 mL/min (A) (by C-G formula based on SCr of 0.38 mg/dL (L)). Liver Function Tests: Recent Labs  Lab 10/20/18 0448 10/21/18 0409  AST 17 14*  ALT 13 13  ALKPHOS 66 76  BILITOT 0.6 0.3  PROT 5.7* 6.1*  ALBUMIN 1.3* 1.3*   No results for input(s): LIPASE, AMYLASE in the last 168 hours. No results for input(s): AMMONIA in the last 168 hours. Coagulation Profile: No results for input(s): INR, PROTIME in the last 168 hours. Cardiac Enzymes: No results for input(s): CKTOTAL, CKMB, CKMBINDEX, TROPONINI in the last 168 hours. BNP (last 3 results) No results for input(s): PROBNP in the last 8760 hours. HbA1C: No results for input(s): HGBA1C in the last 72 hours. CBG: Recent Labs  Lab 10/21/18 1708 10/21/18 2014 10/22/18 0746 10/22/18 1206 10/22/18 1705  GLUCAP 170* 151* 181* 215* 172*   Lipid Profile: No results for input(s): CHOL, HDL, LDLCALC, TRIG, CHOLHDL, LDLDIRECT in the last 72 hours. Thyroid Function Tests: No results for input(s): TSH, T4TOTAL, FREET4, T3FREE, THYROIDAB in  the last 72 hours. Anemia Panel: No results for input(s): VITAMINB12, FOLATE, FERRITIN, TIBC, IRON, RETICCTPCT in the last 72 hours. Sepsis Labs: No results for input(s): PROCALCITON, LATICACIDVEN in the last 168 hours.  No results found for this or any previous visit (from the past 240 hour(s)).   Radiology Studies: No results found.  Scheduled Meds: . atorvastatin  20 mg Oral QHS  . carvedilol  3.125 mg Oral BID WC  . dronabinol  5 mg Oral BID AC  . feeding supplement  1 Container Oral BID BM  . feeding supplement (ENSURE ENLIVE)  237 mL Oral BID BM  . gabapentin  400 mg Oral TID PC & HS  . insulin aspart  0-9 Units Subcutaneous TID AC & HS  . insulin detemir  4 Units Subcutaneous Daily  . morphine  15 mg Oral Q12H  . pantoprazole  40 mg Oral BID  . sodium chloride flush  10-40 mL Intracatheter Q12H   Continuous Infusions:   LOS: 49 days   Marylu Lund, MD [facility] Pager On Amion  If 7PM-7AM, please contact night-coverage 10/22/2018, 6:44 PM

## 2018-10-22 NOTE — Progress Notes (Signed)
Unfortunately, it seems like there is no bed available for [name] and Ryan Medina had a rehab center.  I know that social work is doing their best to try to find a place for him.  He seems to be doing okay right now.  He is eating okay.  He is having no nausea or vomiting.  I would have GI see him again and do an upper endoscopy to see how things look where he had the ulceration.  I will get a CT scan on him next week.  This is of the abdomen so that we can see how Ryan pancreatic cancer looks.  It may be reasonable to start Ryan systemic therapy while he is in the hospital.  He certainly has gotten stronger.  He is having no bleeding issues.  He is still having some pain in the right foot.  This is neuropathic pain that I think he will always have secondary to the arterial embolic disease.  He had blood work done yesterday.  This looked fairly good.  Ryan hemoglobin was 9.5.  [name] 12.  Platelet count 373,000.  Ryan albumin is only 1.3.  This is an issue.  He really needs to get Ryan nutritional status improved if systemic chemotherapy is going to have a positive impact.  Ryan iron studies that we did 4 days ago also looked better.  He is out of bed.  He is walking a little bit.  Ryan vital signs look stable.  Ryan temperature is 99.5.  Pulse 96.  Blood pressure 119/87.  Head neck exam showed no ocular or oral lesions.  He has no obvious oral/palatal thrush.  Lungs are clear.  Cardiac exam regular rate and rhythm with no murmurs, rubs or bruits.  Abdomen is soft.  Bowel sounds are present.  He has no fluid wave.  There is no palpable liver or spleen tip.  Extremities shows the healing surgical scars from Ryan extremity surgeries that he had for the arterial emboli and then also for the compressive fasciitis.  Neurological exam is nonfocal.  We still have to think about systemic therapy for [name].  I really would treat him as a stage IV disease  patient.  The radiation was just to stop him from bleeding.  This does seem to effective.  It would be nice to have some visual proof of this with a upper endoscopy.  We will continue to follow him along.  If we do think that systemic chemotherapy is warranted, he probably will need to go up to [name]  I very much appreciate the outstanding care that he is gotten from everybody on 4 E.  Ryan Haw, MD  Ryan Medina 4:10

## 2018-10-23 LAB — GLUCOSE, CAPILLARY
GLUCOSE-CAPILLARY: 136 mg/dL — AB (ref 70–99)
Glucose-Capillary: 211 mg/dL — ABNORMAL HIGH (ref 70–99)
Glucose-Capillary: 114 mg/dL — ABNORMAL HIGH (ref 70–99)
Glucose-Capillary: 80 mg/dL (ref 70–99)

## 2018-10-23 NOTE — Progress Notes (Signed)
Ryan Medina  FWY:[id number] DOB: [birth year] DOA: 09/03/2018 PCP: Patient, No Pcp Per    Brief Narrative:  58 y.o. male admitted with LLE pain found to have ischemic right leg and acute popliteal and tibial embolus s/p embolectomy 12/13. On heparin gtt. Developed acute RLL hematoma with compartment syndrome requiring fasciotomy 12/15. Decompensated 12/17 am with hypotension with abd distention and 2 massive bloody stools. Hgb drop 9.5 ->5.9. Getting 2 units PRBC, protonix, and GI consulted. Moved to ICU for hemodynamic instability. Had embolization of GDA 09/07/2018. Transferred out of ICU 12/18.  Transferred back to ICU 1/5 overnight for hypotension and recurrent anemia. He has been given a total of 26 units of PRBCs due to ongoing GI blood loss from duodenal ulceration from local invasion of pancreatic cancer. He was ultiamtely transferred to [facility] for initiation of radiation therapy. Hemoglobin has stabilized with resolution of clinical bleeding. He has since completed radiation treatments and had port inserted. His significant medical comorbidities are complicated by an adverse social situation, i.e. homelessness. With functional impairments in mobility, the safest place to discharge would be to a skilled nursing facility to improve strength to return to prior level of functioning. Unfortunately, due to cocaine positivity on admission he has been declined by local SNF's. The only other proposed disposition has been to a shelter which is not a stable enough environment for him in this current clinical state.  Assessment & Plan:   Principal Problem:   Popliteal artery embolus (HCC) Active Problems:   DM (diabetes mellitus), type 2, uncontrolled ([place])   Essential hypertension   Dilated cardiomyopathy (HCC)   Chest pain   AAA (abdominal aortic aneurysm) (HCC)   Ischemic foot   Right leg claudication (HCC)   PVD (peripheral vascular disease) ([place])   Hemorrhagic  shock ([place])   Nontraumatic ischemic infarction of muscle of right lower leg   Upper GI bleed   Cocaine abuse ([place])   Acute blood loss anemia   Chronic pain syndrome   Duodenal ulcer with hemorrhage   Pancreatic cancer ([place])   Goals of care, counseling/discussion   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 Inoperable, but has undergone XRT.  - Dr. Marin Olp following patient, anticipating initiation of abraxane/gemcitabine as an outpatient. Had port placed 1/21.  - The patient has no transportation and would require assistance to present to frequent office visits.  - Presently stable  Acute hemorrhagic shock secondary to acute GI bleed, acute blood loss anemia, iron deficiency anemia:  - Shock resolved, GI bleeding stable, improved, due to duodenal ulcer from pancreatic CA invasion.  - Underwent EGD/hemospray 09/07/2018, GDA embolization 09/07/2018, and underwent radiation therapy with the goal of stopping bleeding.  - Has received, per report 26u PRBCs throughout hospitalization (last was 1/23). s/p IV iron per hematology/oncology, repeated 1/23.  -Continue PPI twice daily. Patient confirms willingness to take this. -GI had been following.  Sepsis likely secondary to HCAP and pseudomonas aeruginosa UTIs/p vancomycin and cefepime:  - Resolved. -Recurrent fever 1/19: In the absence of leukocytosis or localizing symptoms/signs. CXR  improved aeration. Repeat urine culture negative. Blood cultures negative. PICC pulled 1/26. - Stable at present  Ischemic  right foot due to right popliteal and tibial artery embolus:  - s/p embolectomy and bovine pericardial patch angioplasty of popliteal artery and posterior tibial artery 09/03/2018 by Dr. Scot Dock. Also had recurrent right tibial embolus in the setting of stopping anticoagulation due to significant GI bleeding s/p embolectomy 09/10/2018 at the time of fasciotomy closure. - Currently stable. Continue pain control as ordered, no indication for IV or liquid pain medications.  - Needs ongoing PT, has significant debility related to this which puts him at very high risk of readmission were he to attempt to thrive at a homeless shelter. - Continued on statin. Not on anticoagulation/antiplatelet due to hx of GI bleeding.  Chronic combined HFrEF: - Continue coreg. This is a high risk medication when outpatient due to cocaine use.  - Would recommend lasix coadministration with future PRBCs. - Currently not on ACE/ARB due to hx anaphylaxis with lisinopril.  Hypokalemia:  - Repeat supplementation and recheck intermittently. - Will repeat bmet in AM  Cocaine use: +UDS in Dec 2019.  - Cessation counseling provided.    Possible SBO: Resolved. - Stop laxatives, continue prn miralax - Currently stable.  Poorly-controlled T2DM: HbA1c 18.2% Dec 2019 (average ~426m/dl). Had hypoglycemic episode 1/24. - Continue novolog SSI,  -Had resumed lantus, noted to be hypoglycemic recently. Lantus dose decreased. Glucose in the mid 100's to low 200's  Severe protein calorie malnutrition:  - Protein supplementation as able.  DVT prophylaxis: SCD's Code Status: No CPR, no ACLS, OK for intubation Family Communication: Pt in room, family at bedside Disposition Plan: Pending SNF  Consultants:   Oncology  IR  GI  Palliative care medicine team  PCCM  Vascular surgery  Procedures:   EGD 09/07/2018 by Dr. MRush Landmark  Impression: - No gross lesions in proximal esophagus. LA Grade    D esophagitis in middle and distal esophagus.. - Clotted blood in the entire stomach. J-shaped  deformity in the gastric body. - One oozing duodenal ulcer with an adherent clot  ([place]rest Class IIb). There is no evidence of  perforation. Attempts at removal of the clot were  not successful. Without family around, and his  clinical stability, decision made to not remove the  clot because of concern for significant hemodynamic  compromise. Hemospray was applied in effort to  decrease the risk of persistent bleeding while  awaiting next steps in evaluation/therapy as  outlined below. - No gross lesions in the second portion of the  duodenum. Recommendation: The patient will be observed post-procedure,  until all discharge criteria are met. - Return care to ICU. - Discussed case with ICU and with Interventional  Radiology. Overall, high concern for risk of  rebleeding, in setting of active oozing after just  movement of small amount of clot at the base - thus  consistent with still active bleeding. This is too  large of an area for other endoscopic therapies  other than use of Hemospray which was used today.  Next step in evaluation should be a mesenteric  angiogram and likely GDA embolization to decrease   risk as much as possible of severe hemorrhage again. - Do not place NGT for the next few days due to  severe esophagitis. - Continue IV PPI drip. - Send H. pylori serology and treat if positive. - The findings and recommendations were discussed  with the referring physician.  Upper EUS 09/16/2018 by Dr.  Ardis Hughs:  Impression:  - 3.6cm by 2.1cm mass in the neck of pancreas that  surrounds the celiac trunk, obstructs the main  pancreatic duct and appears to have also invaded  the duodenal bulb created a large, malignant,  friable ulcer. I biopsied the abnormal, firm mucosa  surrounding the duodenal ulcer and sampled the  pancreatic mass with transgastric EUS FNA.  Preliminary cytology review from the pancreatic  mass was positive for malignancy.  Recommendation: Return patient to hospital [facility] for ongoing care. - He has recieved 13 units of blood this admission,  3 of them in the past 24 hours. He will continue to  ooze from the duodenal ulcer that I suspect is  malignant, related to the nearby large pancreatic  mass. Judicious use of bloodthinners is strongly  recommended. - Await final cytology/pathology reports; both  hopefully available tomorrow.   Antimicrobials: Anti-infectives (From admission, onward)   Start     Dose/Rate Route Frequency Ordered Stop   10/11/18 2300  vancomycin  (VANCOCIN) IVPB 750 mg/150 ml premix  Status:  Discontinued     750 mg 150 mL/hr over 60 Minutes Intravenous Every 12 hours 10/11/18 1037 10/13/18 1954   10/11/18 1500  ceFAZolin (ANCEF) IVPB 2g/100 mL premix     2 g 200 mL/hr over 30 Minutes Intravenous To Radiology 10/08/18 1348 10/12/18 1500   10/11/18 1200  ceFEPIme (MAXIPIME) 1 g in sodium chloride 0.9 % 100 mL IVPB  Status:  Discontinued     1 g 200 mL/hr over 30 Minutes Intravenous Every 8 hours 10/11/18 1037 10/13/18 1954   10/11/18 1045  vancomycin (VANCOCIN) 1,250 mg in sodium chloride 0.9 % 250 mL IVPB     1,250 mg 166.7 mL/hr over 90 Minutes Intravenous  Once 10/11/18 1037 10/11/18 2057   10/07/18 1600  ceFEPIme (MAXIPIME) 1 g in sodium chloride 0.9 % 100 mL IVPB     1 g 200 mL/hr over 30 Minutes Intravenous Every 8 hours 10/07/18 1452 10/08/18 0105   10/06/18 1000  fluconazole (DIFLUCAN) tablet 100 mg  Status:  Discontinued     100 mg Oral Daily 10/06/18 0709 10/11/18 0834   10/02/18 2100  vancomycin (VANCOCIN) IVPB 750 mg/150 ml premix  Status:  Discontinued     750 mg 150 mL/hr over 60 Minutes Intravenous Every 12 hours 10/02/18 0812 10/04/18 1420   10/02/18 0900  vancomycin (VANCOCIN) 1,250 mg in sodium chloride 0.9 % 250 mL IVPB     1,250 mg 166.7 mL/hr over 90 Minutes Intravenous  Once 10/02/18 0812 10/02/18 1132   10/02/18 0900  ceFEPIme (MAXIPIME) 1 g in sodium chloride 0.9 % 100 mL IVPB  Status:  Discontinued     1 g 200 mL/hr over 30 Minutes Intravenous Every 8 hours 10/02/18 0812 10/07/18 0925   10/02/18 0815  ceFEPIme (MAXIPIME) 1 g in sodium chloride 0.9 % 100 mL IVPB  Status:  Discontinued     1 g 200 mL/hr over 30 Minutes Intravenous Every 8 hours 10/02/18 0800 10/02/18 0812   10/02/18 0800  ceFEPIme (MAXIPIME) 2 g in sodium chloride 0.9 % 100 mL IVPB  Status:  Discontinued     2 g 200 mL/hr over 30 Minutes Intravenous  Once 10/02/18 0757 10/02/18 0800   10/02/18 0800  metroNIDAZOLE (FLAGYL) IVPB 500 mg   Status:  Discontinued     500 mg 100 mL/hr over 60 Minutes Intravenous Every 8 hours 10/02/18 0757 10/02/18 0800   10/02/18 0800  vancomycin (VANCOCIN) IVPB  1000 mg/200 mL premix  Status:  Discontinued     1,000 mg 200 mL/hr over 60 Minutes Intravenous  Once 10/02/18 0757 10/02/18 0800   09/27/18 0000  ceFAZolin (ANCEF) IVPB 2g/100 mL premix     2 g 200 mL/hr over 30 Minutes Intravenous To Radiology 09/25/18 0902 09/28/18 0000   09/10/18 2130  ceFAZolin (ANCEF) IVPB 2g/100 mL premix     2 g 200 mL/hr over 30 Minutes Intravenous Every 8 hours 09/10/18 1634 09/11/18 0700   09/10/18 0800  ceFAZolin (ANCEF) IVPB 1 g/50 mL premix    Note to Pharmacy:  Send with pt to OR   1 g 100 mL/hr over 30 Minutes Intravenous On call 09/09/18 0756 09/10/18 1407   09/07/18 1300  erythromycin 250 mg in sodium chloride 0.9 % 100 mL IVPB     250 mg 100 mL/hr over 60 Minutes Intravenous Once 09/07/18 1125 09/07/18 2351   09/06/18 0000  ceFAZolin (ANCEF) IVPB 1 g/50 mL premix    Note to Pharmacy:  Send with pt to OR   1 g 100 mL/hr over 30 Minutes Intravenous On call 09/05/18 0849 09/05/18 0952   09/03/18 2245  ceFAZolin (ANCEF) IVPB 2g/100 mL premix     2 g 200 mL/hr over 30 Minutes Intravenous Every 8 hours 09/03/18 2236 09/04/18 1444   09/03/18 1615  ceFAZolin (ANCEF) IVPB 2g/100 mL premix  Status:  Discontinued     2 g 200 mL/hr over 30 Minutes Intravenous On call to O.R. 09/03/18 1600 09/03/18 2035      Subjective: Without events  Objective: Vitals:   10/22/18 0549 10/22/18 1408 10/22/18 2031 10/23/18 0500  BP: 128/89 119/87 (!) 142/96 (!) 137/94  Pulse: 85 96 98 94  Resp: 18 14 18 18   Temp: (!) 97.3 F (36.3 C) 99.5 F (37.5 C) (!) 100.4 F (38 C) 99.1 F (37.3 C)  TempSrc: Oral Oral Oral Oral  SpO2: 100% 98% 100% 100%  Weight:      Height:        Intake/Output Summary (Last 24 hours) at 10/23/2018 1126 Last data filed at 10/23/2018 1121 Gross per 24 hour  Intake 970 ml  Output 500  ml  Net 470 ml   Filed Weights   09/28/18 1500 09/28/18 1914 10/11/18 1057  Weight: 59 kg 57.6 kg 54.7 kg    Examination: General exam: Awake, conversant Respiratory system: No audible wheezing  Data Reviewed: I have personally reviewed following labs and imaging studies  CBC: Recent Labs  Lab 10/18/18 0521 10/20/18 0448 10/21/18 0409  WBC 11.0* 10.9* 12.1*  NEUTROABS  --   --  10.2*  HGB 9.6* 8.7* 9.5*  HCT 29.7* 27.8* 29.5*  MCV 90.3 89.7 90.2  PLT 395 377 549   Basic Metabolic Panel: Recent Labs  Lab 10/18/18 0521 10/20/18 0448 10/21/18 0409  NA 133* 131* 133*  K 3.3* 3.2* 3.8  CL 96* 97* 95*  CO2 28 26 29   GLUCOSE 266* 273* 156*  BUN 8 6 6   CREATININE 0.42* 0.46* 0.38*  CALCIUM 7.6* 7.3* 7.8*   GFR: Estimated Creatinine Clearance: 78.8 mL/min (A) (by C-G formula based on SCr of 0.38 mg/dL (L)). Liver Function Tests: Recent Labs  Lab 10/20/18 0448 10/21/18 0409  AST 17 14*  ALT 13 13  ALKPHOS 66 76  BILITOT 0.6 0.3  PROT 5.7* 6.1*  ALBUMIN 1.3* 1.3*   No results for input(s): LIPASE, AMYLASE in the last 168 hours. No results for input(s): AMMONIA  in the last 168 hours. Coagulation Profile: No results for input(s): INR, PROTIME in the last 168 hours. Cardiac Enzymes: No results for input(s): CKTOTAL, CKMB, CKMBINDEX, TROPONINI in the last 168 hours. BNP (last 3 results) No results for input(s): PROBNP in the last 8760 hours. HbA1C: No results for input(s): HGBA1C in the last 72 hours. CBG: Recent Labs  Lab 10/22/18 0746 10/22/18 1206 10/22/18 1705 10/22/18 2028 10/23/18 0736  GLUCAP 181* 215* 172* 167* 211*   Lipid Profile: No results for input(s): CHOL, HDL, LDLCALC, TRIG, CHOLHDL, LDLDIRECT in the last 72 hours. Thyroid Function Tests: No results for input(s): TSH, T4TOTAL, FREET4, T3FREE, THYROIDAB in the last 72 hours. Anemia Panel: No results for input(s): VITAMINB12, FOLATE, FERRITIN, TIBC, IRON, RETICCTPCT in the last 72  hours. Sepsis Labs: No results for input(s): PROCALCITON, LATICACIDVEN in the last 168 hours.  No results found for this or any previous visit (from the past 240 hour(s)).   Radiology Studies: No results found.  Scheduled Meds: . atorvastatin  20 mg Oral QHS  . carvedilol  3.125 mg Oral BID WC  . dronabinol  5 mg Oral BID AC  . feeding supplement  1 Container Oral BID BM  . feeding supplement (ENSURE ENLIVE)  237 mL Oral BID BM  . gabapentin  400 mg Oral TID PC & HS  . insulin aspart  0-9 Units Subcutaneous TID AC & HS  . insulin detemir  4 Units Subcutaneous Daily  . morphine  15 mg Oral Q12H  . pantoprazole  40 mg Oral BID  . sodium chloride flush  10-40 mL Intracatheter Q12H   Continuous Infusions:   LOS: 50 days   Marylu Lund, MD [facility] Pager On Amion  If 7PM-7AM, please contact night-coverage 10/23/2018, 11:26 AM

## 2018-10-24 LAB — GLUCOSE, CAPILLARY
GLUCOSE-CAPILLARY: 210 mg/dL — AB (ref 70–99)
GLUCOSE-CAPILLARY: 187 mg/dL — AB (ref 70–99)
Glucose-Capillary: 179 mg/dL — ABNORMAL HIGH (ref 70–99)
Glucose-Capillary: 125 mg/dL — ABNORMAL HIGH (ref 70–99)

## 2018-10-24 NOTE — Progress Notes (Signed)
PROGRESS NOTE    Ryan Medina  TFT:[id number] DOB: 1961-03-17 DOA: 09/03/2018 PCP: Patient, No Pcp Per    Brief Narrative:  58 y.o. male admitted with LLE pain found to have ischemic right leg and acute popliteal and tibial embolus s/p embolectomy 12/13. On heparin gtt. Developed acute RLL hematoma with compartment syndrome requiring fasciotomy 12/15. Decompensated 12/17 am with hypotension with abd distention and 2 massive bloody stools. Hgb drop 9.5 ->5.9. Getting 2 units PRBC, protonix, and GI consulted. Moved to ICU for hemodynamic instability. Had embolization of GDA 09/07/2018. Transferred out of ICU 12/18.  Transferred back to ICU 1/5 overnight for hypotension and recurrent anemia. He has been given a total of 26 units of PRBCs due to ongoing GI blood loss from duodenal ulceration from local invasion of pancreatic cancer. He was ultiamtely transferred to [facility] for initiation of radiation therapy. Hemoglobin has stabilized with resolution of clinical bleeding. He has since completed radiation treatments and had port inserted. His significant medical comorbidities are complicated by an adverse social situation, i.e. homelessness. With functional impairments in mobility, the safest place to discharge would be to a skilled nursing facility to improve strength to return to prior level of functioning. Unfortunately, due to cocaine positivity on admission he has been declined by local SNF's. The only other proposed disposition has been to a shelter which is not a stable enough environment for him in this current clinical state.  Assessment & Plan:   Principal Problem:   Popliteal artery embolus (HCC) Active Problems:   DM (diabetes mellitus), type 2, uncontrolled ([place])   Essential hypertension   Dilated cardiomyopathy (HCC)   Chest pain   AAA (abdominal aortic aneurysm) (HCC)   Ischemic foot   Right leg claudication (HCC)   PVD (peripheral vascular disease) ([place])   Hemorrhagic  shock ([place])   Nontraumatic ischemic infarction of muscle of right lower leg   Upper GI bleed   Cocaine abuse ([place])   Acute blood loss anemia   Chronic pain syndrome   Duodenal ulcer with hemorrhage   Pancreatic cancer ([place])   Goals of care, counseling/discussion   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 Inoperable, but has undergone XRT.  - Dr. Marin Olp following patient, anticipating initiation of abraxane/gemcitabine as an outpatient. Had port placed 1/21.  - The patient has no transportation and would require assistance to present to frequent office visits.  - Presently currently. Without complaints  Acute hemorrhagic shock secondary to acute GI bleed, acute blood loss anemia, iron deficiency anemia:  - Shock resolved, GI bleeding stable, improved, due to duodenal ulcer from pancreatic CA invasion.  - Underwent EGD/hemospray 09/07/2018, GDA embolization 09/07/2018, and underwent radiation therapy with the goal of stopping bleeding.  - Has received, per report 26u PRBCs throughout hospitalization (last was 1/23). s/p IV iron per hematology/oncology, repeated 1/23.  -Continue PPI twice daily. Patient confirms willingness to take this. -GI had been following.  Sepsis likely secondary to HCAP and pseudomonas aeruginosa UTIs/p vancomycin and cefepime:  - Resolved. -Recurrent fever 1/19: In the absence of leukocytosis or localizing  symptoms/signs. CXR improved aeration. Repeat urine culture negative. Blood cultures negative. PICC pulled 1/26. - Stable  at present  Ischemic right foot due to right popliteal and tibial artery embolus:  - s/p embolectomy and bovine pericardial patch angioplasty of popliteal artery and posterior tibial artery 09/03/2018 by Dr. Scot Dock. Also had recurrent right tibial embolus in the setting of stopping anticoagulation due to significant GI bleeding s/p embolectomy 09/10/2018 at the time of fasciotomy closure. - Currently stable. Continue pain control as ordered, no indication for IV or liquid pain medications.  - Needs ongoing PT, has significant debility related to this which puts him at very high risk of readmission were he to attempt to thrive at a homeless shelter. - Continued on statin. Not on anticoagulation/antiplatelet due to hx of GI bleeding.  Chronic combined HFrEF: - Continue coreg. This is a high risk medication when outpatient due to cocaine use.  - Would recommend lasix coadministration with future PRBCs. - Currently not on ACE/ARB due to hx anaphylaxis with lisinopril.  Hypokalemia:  - Repeat supplementation and recheck intermittently. - Will repeat bmet in AM  Cocaine use: +UDS in Dec 2019.  - Cessation counseling provided.    Possible SBO: Resolved. - Stop laxatives, continue prn miralax - Currently stable.  Poorly-controlled T2DM: HbA1c 18.2% Dec 2019 (average ~422m/dl). Had hypoglycemic episode 1/24. - Continue novolog SSI,  -Had resumed lantus, noted to be hypoglycemic recently. Lantus dose decreased. Glucose in the mid 100's to low 200's, stable  Severe protein calorie malnutrition:  - Protein supplementation as able.  DVT prophylaxis: SCD's Code Status: No CPR, no ACLS, OK for intubation Family Communication: Pt in room, family at bedside Disposition Plan: Pending SNF  Consultants:   Oncology  IR  GI  Palliative care medicine team  PCCM  Vascular surgery  Procedures:   EGD 09/07/2018 by Dr. MRush Landmark  Impression: - No gross lesions in proximal  esophagus. LA Grade  D esophagitis in middle and distal esophagus.. - Clotted blood in the entire stomach. J-shaped  deformity in the gastric body. - One oozing duodenal ulcer with an adherent clot  ([place] Class IIb). There is no evidence of  perforation. Attempts at removal of the clot were  not successful. Without family around, and his  clinical stability, decision made to not remove the  clot because of concern for significant hemodynamic  compromise. Hemospray was applied in effort to  decrease the risk of persistent bleeding while  awaiting next steps in evaluation/therapy as  outlined below. - No gross lesions in the second portion of the  duodenum. Recommendation: The patient will be observed post-procedure,  until all discharge criteria are met. - Return care to ICU. - Discussed case with ICU and with Interventional  Radiology. Overall, high concern for risk of  rebleeding, in setting of active oozing after just  movement of small amount of clot at the base - thus  consistent with still active bleeding. This is too  large of an area for other endoscopic therapies  other than use of Hemospray which was used today.  Next step in evaluation should be a mesenteric  angiogram and likely GDA embolization to decrease    risk as much as possible of severe hemorrhage again. - Do not place NGT for the next few days due to  severe esophagitis. - Continue IV PPI drip. - Send H. pylori serology and treat if positive. - The findings and recommendations were discussed  with the referring physician.  Upper EUS  09/16/2018 by Dr. Ardis Hughs:  Impression:  - 3.6cm by 2.1cm mass in the neck of pancreas that  surrounds the celiac trunk, obstructs the main  pancreatic duct and appears to have also invaded  the duodenal bulb created a large, malignant,  friable ulcer. I biopsied the abnormal, firm mucosa  surrounding the duodenal ulcer and sampled the  pancreatic mass with transgastric EUS FNA.  Preliminary cytology review from the pancreatic  mass was positive for malignancy.  Recommendation: Return patient to hospital [facility] for ongoing care. - He has recieved 13 units of blood this admission,  3 of them in the past 24 hours. He will continue to  ooze from the duodenal ulcer that I suspect is  malignant, related to the nearby large pancreatic  mass. Judicious use of bloodthinners is strongly  recommended. - Await final cytology/pathology reports; both  hopefully available tomorrow.   Antimicrobials: Anti-infectives (From admission, onward)   Start     Dose/Rate Route Frequency Ordered Stop   10/11/18 2300  vancomycin  (VANCOCIN) IVPB 750 mg/150 ml premix  Status:  Discontinued     750 mg 150 mL/hr over 60 Minutes Intravenous Every 12 hours 10/11/18 1037 10/13/18 1954   10/11/18 1500  ceFAZolin (ANCEF) IVPB 2g/100 mL premix     2 g 200 mL/hr over 30 Minutes Intravenous To Radiology 10/08/18 1348 10/12/18 1500   10/11/18 1200  ceFEPIme (MAXIPIME) 1 g in sodium chloride 0.9 % 100 mL IVPB  Status:  Discontinued     1 g 200 mL/hr over 30 Minutes Intravenous Every 8 hours 10/11/18 1037 10/13/18 1954   10/11/18 1045  vancomycin (VANCOCIN) 1,250 mg in sodium chloride 0.9 % 250 mL IVPB     1,250 mg 166.7 mL/hr over 90 Minutes Intravenous  Once 10/11/18 1037 10/11/18 2057   10/07/18 1600  ceFEPIme (MAXIPIME) 1 g in sodium chloride 0.9 % 100 mL IVPB     1 g 200 mL/hr over 30 Minutes Intravenous Every 8 hours 10/07/18 1452 10/08/18 0105   10/06/18 1000  fluconazole (DIFLUCAN) tablet 100 mg  Status:  Discontinued     100 mg Oral Daily 10/06/18 0709 10/11/18 0834   10/02/18 2100  vancomycin (VANCOCIN) IVPB 750 mg/150 ml premix  Status:  Discontinued     750 mg 150 mL/hr over 60 Minutes Intravenous Every 12 hours 10/02/18 0812 10/04/18 1420   10/02/18 0900  vancomycin (VANCOCIN) 1,250 mg in sodium chloride 0.9 % 250 mL IVPB     1,250 mg 166.7 mL/hr over 90 Minutes Intravenous  Once 10/02/18 0812 10/02/18 1132   10/02/18 0900  ceFEPIme (MAXIPIME) 1 g in sodium chloride 0.9 % 100 mL IVPB  Status:  Discontinued     1 g 200 mL/hr over 30 Minutes Intravenous Every 8 hours 10/02/18 0812 10/07/18 0925   10/02/18 0815  ceFEPIme (MAXIPIME) 1 g in sodium chloride 0.9 % 100 mL IVPB  Status:  Discontinued     1 g 200 mL/hr over 30 Minutes Intravenous Every 8 hours 10/02/18 0800 10/02/18 0812   10/02/18 0800  ceFEPIme (MAXIPIME) 2 g in sodium chloride 0.9 % 100 mL IVPB  Status:  Discontinued     2 g 200 mL/hr over 30 Minutes Intravenous  Once 10/02/18 0757 10/02/18 0800   10/02/18 0800  metroNIDAZOLE (FLAGYL) IVPB 500 mg   Status:  Discontinued     500 mg 100 mL/hr over 60 Minutes Intravenous Every 8 hours 10/02/18 0757 10/02/18 0800   10/02/18 0800  vancomycin (VANCOCIN) IVPB 1000 mg/200 mL premix  Status:  Discontinued     1,000 mg 200 mL/hr over 60 Minutes Intravenous  Once 10/02/18 0757 10/02/18 0800   09/27/18 0000  ceFAZolin (ANCEF) IVPB 2g/100 mL premix     2 g 200 mL/hr over 30 Minutes Intravenous To Radiology 09/25/18 0902 09/28/18 0000   09/10/18 2130  ceFAZolin (ANCEF) IVPB 2g/100 mL premix     2 g 200 mL/hr over 30 Minutes Intravenous Every 8 hours 09/10/18 1634 09/11/18 0700   09/10/18 0800  ceFAZolin (ANCEF) IVPB 1 g/50 mL premix    Note to Pharmacy:  Send with pt to OR   1 g 100 mL/hr over 30 Minutes Intravenous On call 09/09/18 0756 09/10/18 1407   09/07/18 1300  erythromycin 250 mg in sodium chloride 0.9 % 100 mL IVPB     250 mg 100 mL/hr over 60 Minutes Intravenous Once 09/07/18 1125 09/07/18 2351   09/06/18 0000  ceFAZolin (ANCEF) IVPB 1 g/50 mL premix    Note to Pharmacy:  Send with pt to OR   1 g 100 mL/hr over 30 Minutes Intravenous On call 09/05/18 0849 09/05/18 0952   09/03/18 2245  ceFAZolin (ANCEF) IVPB 2g/100 mL premix     2 g 200 mL/hr over 30 Minutes Intravenous Every 8 hours 09/03/18 2236 09/04/18 1444   09/03/18 1615  ceFAZolin (ANCEF) IVPB 2g/100 mL premix  Status:  Discontinued     2 g 200 mL/hr over 30 Minutes Intravenous On call to O.R. 09/03/18 1600 09/03/18 2035      Subjective: In good spirits. No complaints  Objective: Vitals:   10/23/18 2036 10/24/18 0001 10/24/18 0400 10/24/18 1436  BP: (!) 117/95 119/84 (!) 126/92 126/86  Pulse: 97 90 94 100  Resp: _0 Temp: 99.3 F (37.4 C) 98.5 F (36.9 C) 98.1 F (36.7 C) 99.1 F (37.3 C)  TempSrc: Oral Oral Oral Oral  SpO2: 97% 99% 100% 99%  Weight:      Height:        Intake/Output Summary (Last 24 hours) at 10/24/2018 1546 Last data filed at 10/24/2018 1026 Gross per 24 hour  Intake 240 ml    Output 1150 ml  Net -910 ml   Filed Weights   09/28/18 1500 09/28/18 1914 10/11/18 1057  Weight: 59 kg 57.6 kg 54.7 kg    Examination: General exam: Awake, laying in bed, in nad Respiratory system: Normal respiratory effort, no wheezing  Data Reviewed: I have personally reviewed following labs and imaging studies  CBC: Recent Labs  Lab 10/18/18 0521 10/20/18 0448 10/21/18 0409  WBC 11.0* 10.9* 12.1*  NEUTROABS  --   --  10.2*  HGB 9.6* 8.7* 9.5*  HCT 29.7* 27.8* 29.5*  MCV 90.3 89.7 90.2  PLT 395 377 201   Basic Metabolic Panel: Recent Labs  Lab 10/18/18 0521 10/20/18 0448 10/21/18 0409  NA 133* 131* 133*  K 3.3* 3.2* 3.8  CL 96* 97* 95*  CO2 _1 GLUCOSE 266* 273* 156*  BUN _2 CREATININE 0.42* 0.46* 0.38*  CALCIUM 7.6* 7.3* 7.8*   GFR: Estimated Creatinine Clearance: 78.8 mL/min (A) (by C-G formula based on SCr of 0.38 mg/dL (L)). Liver Function Tests: Recent Labs  Lab 10/20/18 0448 10/21/18 0409  AST 17 14*  ALT 13 13  ALKPHOS 66 76  BILITOT 0.6 0.3  PROT 5.7* 6.1*  ALBUMIN 1.3* 1.3*   No results for input(s): LIPASE,  AMYLASE in the last 168 hours. No results for input(s): AMMONIA in the last 168 hours. Coagulation Profile: No results for input(s): INR, PROTIME in the last 168 hours. Cardiac Enzymes: No results for input(s): CKTOTAL, CKMB, CKMBINDEX, TROPONINI in the last 168 hours. BNP (last 3 results) No results for input(s): PROBNP in the last 8760 hours. HbA1C: No results for input(s): HGBA1C in the last 72 hours. CBG: Recent Labs  Lab 10/23/18 1211 10/23/18 1719 10/23/18 2031 10/24/18 0752 10/24/18 1201  GLUCAP 114* 80 136* 210* 187*   Lipid Profile: No results for input(s): CHOL, HDL, LDLCALC, TRIG, CHOLHDL, LDLDIRECT in the last 72 hours. Thyroid Function Tests: No results for input(s): TSH, T4TOTAL, FREET4, T3FREE, THYROIDAB in the last 72 hours. Anemia Panel: No results for input(s): VITAMINB12, FOLATE, FERRITIN,  TIBC, IRON, RETICCTPCT in the last 72 hours. Sepsis Labs: No results for input(s): PROCALCITON, LATICACIDVEN in the last 168 hours.  No results found for this or any previous visit (from the past 240 hour(s)).   Radiology Studies: No results found.  Scheduled Meds: . atorvastatin  20 mg Oral QHS  . carvedilol  3.125 mg Oral BID WC  . dronabinol  5 mg Oral BID AC  . feeding supplement  1 Container Oral BID BM  . feeding supplement (ENSURE ENLIVE)  237 mL Oral BID BM  . gabapentin  400 mg Oral TID PC & HS  . insulin aspart  0-9 Units Subcutaneous TID AC & HS  . insulin detemir  4 Units Subcutaneous Daily  . morphine  15 mg Oral Q12H  . pantoprazole  40 mg Oral BID   Continuous Infusions:   LOS: 51 days   Marylu Lund, MD [facility] Pager On Amion  If 7PM-7AM, please contact night-coverage 10/24/2018, 3:46 PM

## 2018-10-25 ENCOUNTER — Ambulatory Visit: Payer: Self-pay

## 2018-10-25 DIAGNOSIS — Z01818 Encounter for other preprocedural examination: Secondary | ICD-10-CM

## 2018-10-25 DIAGNOSIS — Z0189 Encounter for other specified special examinations: Secondary | ICD-10-CM

## 2018-10-25 LAB — GLUCOSE, CAPILLARY
Glucose-Capillary: 174 mg/dL — ABNORMAL HIGH (ref 70–99)
Glucose-Capillary: 193 mg/dL — ABNORMAL HIGH (ref 70–99)
Glucose-Capillary: 155 mg/dL — ABNORMAL HIGH (ref 70–99)
Glucose-Capillary: 91 mg/dL (ref 70–99)

## 2018-10-25 MED ORDER — KETOROLAC TROMETHAMINE 30 MG/ML IJ SOLN
30.0000 mg | Freq: Four times a day (QID) | INTRAMUSCULAR | Status: DC | PRN
  Administered 2018-10-25: 30 mg via INTRAVENOUS
  Filled 2018-10-25: qty 1

## 2018-10-25 NOTE — Progress Notes (Signed)
Physical Therapy Treatment Patient Details Name: Ryan Medina MRN: [id number] DOB: Jan 15, 1961 Today's Date: 10/25/2018    History of Present Illness 58 y.o. male with PMH including but not limited to DM and HTN who presented to the hospital on 12/13 with a right ischemic leg secondary to acute popliteal and tibial embolus, underwent embolectomy on 12/13 and subsequently was placed on a heparin drip.  He unfortunately developed a acute right lower leg hematoma with compartment syndrome requiring fasciotomy on 12/15.  Further hospital course was complicated by development of hemorrhagic shock with acute blood loss anemia secondary to a bleeding duodenal ulcer-requiring ICU transfer-intubation for airway protection and IR evaluation with mesenteric angiogram and GDA embolization.  Upon stability he [facility]. Pt is now s/p closure of R LE fasciotomy on 09/10/18. 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  Plans to transfer to [facility] for radiation treatment.     PT Comments    Pt is gradually progressing with mobility, he ambulated 4' with RW today, maintaining RLE in non weightbearing position. He is unable to tolerate even toe touch weight bearing with RLE. He reports R foot is numb to light touch. R ankle dorsiflexion AROM is -15*, also limited by pain.    Follow Up Recommendations  SNF(pt is homeless)     Equipment Recommendations  Rolling walker with 5" wheels;3in1 (PT);Wheelchair (measurements PT);Wheelchair cushion (measurements PT)    Recommendations for Other Services       Precautions / Restrictions Precautions Precautions: Fall Precaution Comments: pt is currently hopping on one leg due to R LE pain Other Brace: obtained a CAM boot for right  ankle hoping to tolerate  more WB, did not change the pain with dependency of right foot. Restrictions Weight Bearing Restrictions: No RLE Weight Bearing:  Weight bearing as tolerated    Mobility  Bed Mobility   Bed Mobility: Supine to Sit;Sit to Supine     Supine to sit: Modified independent (Device/Increase time) Sit to supine: Modified independent (Device/Increase time)   General bed mobility comments: used rail, HOB up 20*  Transfers Overall transfer level: Needs assistance Equipment used: Rolling walker (2 wheeled) Transfers: Sit to/from Stand Sit to Stand: Supervision         General transfer comment: pt prefers to have no hands on assistance, no loss of balance  Ambulation/Gait Ambulation/Gait assistance: Supervision Gait Distance (Feet): 35 Feet Assistive device: Rolling walker (2 wheeled) Gait Pattern/deviations: Step-to pattern Gait velocity:  decreased   General Gait Details: pt keeps RLE in NWB position, stated he cannot tolerate pain of even TDWB, distance limited by L ankle pain   Stairs             Wheelchair Mobility    Modified Rankin (Stroke Patients Only)       Balance     Sitting balance-Leahy Scale: Good       Standing balance-Leahy Scale: Fair                              Cognition Arousal/Alertness: Awake/alert Behavior During Therapy: WFL for tasks assessed/performed Overall Cognitive Status: Within Functional Limits for tasks assessed                                        Exercises  General Exercises - Lower Extremity Ankle Circles/Pumps: AROM;AAROM;Right;10 reps;Supine(R ankle DF AROM -15*, AAROM -5*) Heel Slides: AROM;Right;5 reps;Supine Straight Leg Raises: AROM;Right;10 reps;Supine Other Exercises Other Exercises: reviewed Stretch right LE with gait belt for calf and hamstring.(education on need for extended knee for best results)    General Comments        Pertinent Vitals/Pain Pain Score: 7  Pain Location: R ankle Pain Descriptors / Indicators: Sore Pain Intervention(s): Limited activity within patient's tolerance;Monitored during  session;Premedicated before session;Patient requesting pain meds-RN notified;Repositioned    Home Living                      Prior Function            PT Goals (current goals can now be found in the care plan section) Acute Rehab PT Goals Patient Stated Goal: decrease leg pain with mobility PT Goal Formulation: With patient/family Time For Goal Achievement: 11/02/18 Potential to Achieve Goals: Fair Progress towards PT goals: Progressing toward goals    Frequency    Min 2X/week      PT Plan Current plan remains appropriate    Co-evaluation              AM-PAC PT "6 Clicks" Mobility   Outcome Measure  Help needed turning from your back to your side while in a flat bed without using bedrails?: None Help needed moving from lying on your back to sitting on the side of a flat bed without using bedrails?: None Help needed moving to and from a bed to a chair (including a wheelchair)?: A Little Help needed standing up from a chair using your arms (e.g., wheelchair or bedside chair)?: A Little Help needed to walk in hospital room?: A Little Help needed climbing 3-5 steps with a railing? : A Little 6 Click Score: 20    End of Session Equipment Utilized During Treatment: Gait belt Activity Tolerance: Patient limited by pain Patient left: with call bell/phone within reach;with family/visitor present;in chair Nurse Communication: Mobility status PT Visit Diagnosis: Other abnormalities of gait and mobility (R26.89);Pain Pain - Right/Left: Right Pain - part of body: Leg     Time: 9518-8416 PT Time Calculation (min) (ACUTE ONLY): 17 min  Charges:  $Gait Training: 8-22 mins                    Blondell Reveal Kistler PT 10/25/2018  Acute Rehabilitation Services Pager [phone] Office [phone]

## 2018-10-25 NOTE — Progress Notes (Signed)
OT Cancellation Note  Patient Details Name: Ryan Medina MRN: [id number] DOB: October 23, 1960   Cancelled Treatment:    Reason Eval/Treat Not Completed: Fatigue/lethargy limiting ability to participate  Pt reports wanting to go to sleep and rest.  Girlfriend present.  Pt reports getting up earlier in the day.    Kari Baars, OT Acute Rehabilitation Services [phone] Office- [phone], Thereasa Parkin 10/25/2018, 3:57 PM

## 2018-10-25 NOTE — Progress Notes (Signed)
PROGRESS NOTE    Osei Anger Win  HYQ:[id number] DOB: 09-Dec-1960 DOA: 09/03/2018 PCP: Patient, No Pcp Per    Brief Narrative:  58 y.o. male admitted with LLE pain found to have ischemic right leg and acute popliteal and tibial embolus s/p embolectomy 12/13. On heparin gtt. Developed acute RLL hematoma with compartment syndrome requiring fasciotomy 12/15. Decompensated 12/17 am with hypotension with abd distention and 2 massive bloody stools. Hgb drop 9.5 ->5.9. Getting 2 units PRBC, protonix, and GI consulted. Moved to ICU for hemodynamic instability. Had embolization of GDA 09/07/2018. Transferred out of ICU 12/18.  Transferred back to ICU 1/5 overnight for hypotension and recurrent anemia. He has been given a total of 26 units of PRBCs due to ongoing GI blood loss from duodenal ulceration from local invasion of pancreatic cancer. He was ultiamtely transferred to [facility] for initiation of radiation therapy. Hemoglobin has stabilized with resolution of clinical bleeding. He has since completed radiation treatments and had port inserted. His significant medical comorbidities are complicated by an adverse social situation, i.e. homelessness. With functional impairments in mobility, the safest place to discharge would be to a skilled nursing facility to improve strength to return to prior level of functioning. Unfortunately, due to cocaine positivity on admission he has been declined by local SNF's. The only other proposed disposition has been to a shelter which is not a stable enough environment for him in this current clinical state.  Assessment & Plan:   Principal Problem:   Popliteal artery embolus (HCC) Active Problems:   DM (diabetes mellitus), type 2, uncontrolled ([place])   Essential hypertension   Dilated cardiomyopathy (HCC)   Chest pain   AAA (abdominal aortic aneurysm) (HCC)   Ischemic foot   Right leg claudication (HCC)   PVD (peripheral vascular disease) ([place])   Hemorrhagic  shock ([place])   Nontraumatic ischemic infarction of muscle of right lower leg   Upper GI bleed   Cocaine abuse ([place])   Acute blood loss anemia   Chronic pain syndrome   Duodenal ulcer with hemorrhage   Pancreatic cancer ([place])   Goals of care, counseling/discussion   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 Inoperable, but has undergone XRT.  - Dr. Marin Olp following patient, anticipating initiation of abraxane/gemcitabine as an outpatient. Had port placed 1/21.  - The patient has no transportation and would require assistance to present to frequent office visits.  - Stable at present  Acute hemorrhagic shock secondary to acute GI bleed, acute blood loss anemia, iron deficiency anemia:  - Shock resolved, GI bleeding stable, improved, due to duodenal ulcer from pancreatic CA invasion.  - Underwent EGD/hemospray 09/07/2018, GDA embolization 09/07/2018, and underwent radiation therapy with the goal of stopping bleeding.  - Has received, per report 26u PRBCs throughout hospitalization (last was 1/23). s/p IV iron per hematology/oncology, repeated 1/23.  -Continue PPI twice daily. Patient confirms willingness to take this. -GI had been following.  Sepsis likely secondary to HCAP and pseudomonas aeruginosa UTIs/p vancomycin and cefepime:  - Resolved. -Recurrent fever 1/19: In the absence of leukocytosis or localizing symptoms/signs.  CXR improved aeration. Repeat urine culture negative. Blood cultures negative. PICC pulled 1/26. - Currently stable  Ischemic  right foot due to right popliteal and tibial artery embolus:  - s/p embolectomy and bovine pericardial patch angioplasty of popliteal artery and posterior tibial artery 09/03/2018 by Dr. Scot Dock. Also had recurrent right tibial embolus in the setting of stopping anticoagulation due to significant GI bleeding s/p embolectomy 09/10/2018 at the time of fasciotomy closure. - Currently stable. Continue pain control as ordered, no indication for IV or liquid pain medications.  - Needs ongoing PT, has significant debility related to this which puts him at very high risk of readmission were he to attempt to thrive at a homeless shelter. - Continued on statin. Not on anticoagulation/antiplatelet due to hx of GI bleeding.  Chronic combined HFrEF: - Continue coreg. This is a high risk medication when outpatient due to cocaine use.  - Would recommend lasix coadministration with future PRBCs. - Currently not on ACE/ARB due to hx anaphylaxis with lisinopril.  Hypokalemia:  - Repeat supplementation and recheck intermittently. - Will repeat bmet in AM  Cocaine use: +UDS in Dec 2019.  - Cessation counseling provided.    Possible SBO: Resolved. - Stop laxatives, continue prn miralax - Currently stable.  Poorly-controlled T2DM: HbA1c 18.2% Dec 2019 (average ~439m/dl). Had hypoglycemic episode 1/24. - Continue novolog SSI,  -Had resumed lantus, noted to be hypoglycemic recently. Lantus dose decreased. Glucose in the mid 100's to low 200's, stable  Severe protein calorie malnutrition:  - Protein supplementation as able.  DVT prophylaxis: SCD's Code Status: No CPR, no ACLS, OK for intubation Family Communication: Pt in room, family at bedside Disposition Plan: Pending SNF  Consultants:   Oncology  IR  GI  Palliative care medicine team  PCCM  Vascular surgery  Procedures:   EGD 09/07/2018 by Dr. MRush Landmark  Impression: - No gross lesions in proximal esophagus. LA Grade    D esophagitis in middle and distal esophagus.. - Clotted blood in the entire stomach. J-shaped  deformity in the gastric body. - One oozing duodenal ulcer with an adherent clot  (Forrest Class IIb). There is no evidence of  perforation. Attempts at removal of the clot were  not successful. Without family around, and his  clinical stability, decision made to not remove the  clot because of concern for significant hemodynamic  compromise. Hemospray was applied in effort to  decrease the risk of persistent bleeding while  awaiting next steps in evaluation/therapy as  outlined below. - No gross lesions in the second portion of the  duodenum. Recommendation: The patient will be observed post-procedure,  until all discharge criteria are met. - Return care to ICU. - Discussed case with ICU and with Interventional  Radiology. Overall, high concern for risk of  rebleeding, in setting of active oozing after just  movement of small amount of clot at the base - thus  consistent with still active bleeding. This is too  large of an area for other endoscopic therapies  other than use of Hemospray which was used today.  Next step in evaluation should be a mesenteric  angiogram and likely GDA embolization to decrease   risk as much as possible of severe hemorrhage again. - Do not place NGT for the next few days due to  severe esophagitis. - Continue IV PPI drip. - Send H. pylori serology and treat if positive. - The findings and recommendations were discussed  with the referring physician.  Upper EUS 09/16/2018 by  Dr. Ardis Hughs:  Impression:  - 3.6cm by 2.1cm mass in the neck of pancreas that  surrounds the celiac trunk, obstructs the main  pancreatic duct and appears to have also invaded  the duodenal bulb created a large, malignant,  friable ulcer. I biopsied the abnormal, firm mucosa  surrounding the duodenal ulcer and sampled the  pancreatic mass with transgastric EUS FNA.  Preliminary cytology review from the pancreatic  mass was positive for malignancy.  Recommendation: Return patient to hospital [facility] for ongoing care. - He has recieved 13 units of blood this admission,  3 of them in the past 24 hours. He will continue to  ooze from the duodenal ulcer that I suspect is  malignant, related to the nearby large pancreatic  mass. Judicious use of bloodthinners is strongly  recommended. - Await final cytology/pathology reports; both  hopefully available tomorrow.   Antimicrobials: Anti-infectives (From admission, onward)   Start     Dose/Rate Route Frequency Ordered Stop   10/11/18 2300  vancomycin  (VANCOCIN) IVPB 750 mg/150 ml premix  Status:  Discontinued     750 mg 150 mL/hr over 60 Minutes Intravenous Every 12 hours 10/11/18 1037 10/13/18 1954   10/11/18 1500  ceFAZolin (ANCEF) IVPB 2g/100 mL premix     2 g 200 mL/hr over 30 Minutes Intravenous To Radiology 10/08/18 1348 10/12/18 1500   10/11/18 1200  ceFEPIme (MAXIPIME) 1 g in sodium chloride 0.9 % 100 mL IVPB  Status:  Discontinued     1 g 200 mL/hr over 30 Minutes Intravenous Every 8 hours 10/11/18 1037 10/13/18 1954   10/11/18 1045  vancomycin (VANCOCIN) 1,250 mg in sodium chloride 0.9 % 250 mL IVPB     1,250 mg 166.7 mL/hr over 90 Minutes Intravenous  Once 10/11/18 1037 10/11/18 2057   10/07/18 1600  ceFEPIme (MAXIPIME) 1 g in sodium chloride 0.9 % 100 mL IVPB     1 g 200 mL/hr over 30 Minutes Intravenous Every 8 hours 10/07/18 1452 10/08/18 0105   10/06/18 1000  fluconazole (DIFLUCAN) tablet 100 mg  Status:  Discontinued     100 mg Oral Daily 10/06/18 0709 10/11/18 0834   10/02/18 2100  vancomycin (VANCOCIN) IVPB 750 mg/150 ml premix  Status:  Discontinued     750 mg 150 mL/hr over 60 Minutes Intravenous Every 12 hours 10/02/18 0812 10/04/18 1420   10/02/18 0900  vancomycin (VANCOCIN) 1,250 mg in sodium chloride 0.9 % 250 mL IVPB     1,250 mg 166.7 mL/hr over 90 Minutes Intravenous  Once 10/02/18 0812 10/02/18 1132   10/02/18 0900  ceFEPIme (MAXIPIME) 1 g in sodium chloride 0.9 % 100 mL IVPB  Status:  Discontinued     1 g 200 mL/hr over 30 Minutes Intravenous Every 8 hours 10/02/18 0812 10/07/18 0925   10/02/18 0815  ceFEPIme (MAXIPIME) 1 g in sodium chloride 0.9 % 100 mL IVPB  Status:  Discontinued     1 g 200 mL/hr over 30 Minutes Intravenous Every 8 hours 10/02/18 0800 10/02/18 0812   10/02/18 0800  ceFEPIme (MAXIPIME) 2 g in sodium chloride 0.9 % 100 mL IVPB  Status:  Discontinued     2 g 200 mL/hr over 30 Minutes Intravenous  Once 10/02/18 0757 10/02/18 0800   10/02/18 0800  metroNIDAZOLE (FLAGYL) IVPB 500 mg   Status:  Discontinued     500 mg 100 mL/hr over 60 Minutes Intravenous Every 8 hours 10/02/18 0757 10/02/18 0800   10/02/18 0800  vancomycin (VANCOCIN)  IVPB 1000 mg/200 mL premix  Status:  Discontinued     1,000 mg 200 mL/hr over 60 Minutes Intravenous  Once 10/02/18 0757 10/02/18 0800   09/27/18 0000  ceFAZolin (ANCEF) IVPB 2g/100 mL premix     2 g 200 mL/hr over 30 Minutes Intravenous To Radiology 09/25/18 0902 09/28/18 0000   09/10/18 2130  ceFAZolin (ANCEF) IVPB 2g/100 mL premix     2 g 200 mL/hr over 30 Minutes Intravenous Every 8 hours 09/10/18 1634 09/11/18 0700   09/10/18 0800  ceFAZolin (ANCEF) IVPB 1 g/50 mL premix    Note to Pharmacy:  Send with pt to OR   1 g 100 mL/hr over 30 Minutes Intravenous On call 09/09/18 0756 09/10/18 1407   09/07/18 1300  erythromycin 250 mg in sodium chloride 0.9 % 100 mL IVPB     250 mg 100 mL/hr over 60 Minutes Intravenous Once 09/07/18 1125 09/07/18 2351   09/06/18 0000  ceFAZolin (ANCEF) IVPB 1 g/50 mL premix    Note to Pharmacy:  Send with pt to OR   1 g 100 mL/hr over 30 Minutes Intravenous On call 09/05/18 0849 09/05/18 0952   09/03/18 2245  ceFAZolin (ANCEF) IVPB 2g/100 mL premix     2 g 200 mL/hr over 30 Minutes Intravenous Every 8 hours 09/03/18 2236 09/04/18 1444   09/03/18 1615  ceFAZolin (ANCEF) IVPB 2g/100 mL premix  Status:  Discontinued     2 g 200 mL/hr over 30 Minutes Intravenous On call to O.R. 09/03/18 1600 09/03/18 2035      Subjective: Without complaints  Objective: Vitals:   10/24/18 0400 10/24/18 1436 10/24/18 2113 10/25/18 0511  BP: (!) 126/92 126/86 127/89 112/82  Pulse: 94 100 99 88  Resp: 16 16 20    Temp: 98.1 F (36.7 C) 99.1 F (37.3 C) 100.1 F (37.8 C) 98.2 F (36.8 C)  TempSrc: Oral Oral Oral Oral  SpO2: 100% 99% 98% 98%  Weight:      Height:        Intake/Output Summary (Last 24 hours) at 10/25/2018 0846 Last data filed at 10/25/2018 0510 Gross per 24 hour  Intake 720 ml  Output 775 ml  Net  -55 ml   Filed Weights   09/28/18 1500 09/28/18 1914 10/11/18 1057  Weight: 59 kg 57.6 kg 54.7 kg    Examination: General exam: Conversant, in no acute distress Respiratory system: normal chest rise, clear, no audible wheezing  Data Reviewed: I have personally reviewed following labs and imaging studies  CBC: Recent Labs  Lab 10/20/18 0448 10/21/18 0409  WBC 10.9* 12.1*  NEUTROABS  --  10.2*  HGB 8.7* 9.5*  HCT 27.8* 29.5*  MCV 89.7 90.2  PLT 377 859   Basic Metabolic Panel: Recent Labs  Lab 10/20/18 0448 10/21/18 0409  NA 131* 133*  K 3.2* 3.8  CL 97* 95*  CO2 26 29  GLUCOSE 273* 156*  BUN 6 6  CREATININE 0.46* 0.38*  CALCIUM 7.3* 7.8*   GFR: Estimated Creatinine Clearance: 78.8 mL/min (A) (by C-G formula based on SCr of 0.38 mg/dL (L)). Liver Function Tests: Recent Labs  Lab 10/20/18 0448 10/21/18 0409  AST 17 14*  ALT 13 13  ALKPHOS 66 76  BILITOT 0.6 0.3  PROT 5.7* 6.1*  ALBUMIN 1.3* 1.3*   No results for input(s): LIPASE, AMYLASE in the last 168 hours. No results for input(s): AMMONIA in the last 168 hours. Coagulation Profile: No results for input(s): INR, PROTIME in the  last 168 hours. Cardiac Enzymes: No results for input(s): CKTOTAL, CKMB, CKMBINDEX, TROPONINI in the last 168 hours. BNP (last 3 results) No results for input(s): PROBNP in the last 8760 hours. HbA1C: No results for input(s): HGBA1C in the last 72 hours. CBG: Recent Labs  Lab 10/23/18 2031 10/24/18 0752 10/24/18 1201 10/24/18 1707 10/24/18 2108  GLUCAP 136* 210* 187* 179* 125*   Lipid Profile: No results for input(s): CHOL, HDL, LDLCALC, TRIG, CHOLHDL, LDLDIRECT in the last 72 hours. Thyroid Function Tests: No results for input(s): TSH, T4TOTAL, FREET4, T3FREE, THYROIDAB in the last 72 hours. Anemia Panel: No results for input(s): VITAMINB12, FOLATE, FERRITIN, TIBC, IRON, RETICCTPCT in the last 72 hours. Sepsis Labs: No results for input(s): PROCALCITON,  LATICACIDVEN in the last 168 hours.  No results found for this or any previous visit (from the past 240 hour(s)).   Radiology Studies: No results found.  Scheduled Meds: . atorvastatin  20 mg Oral QHS  . carvedilol  3.125 mg Oral BID WC  . dronabinol  5 mg Oral BID AC  . feeding supplement  1 Container Oral BID BM  . feeding supplement (ENSURE ENLIVE)  237 mL Oral BID BM  . gabapentin  400 mg Oral TID PC & HS  . insulin aspart  0-9 Units Subcutaneous TID AC & HS  . insulin detemir  4 Units Subcutaneous Daily  . morphine  15 mg Oral Q12H  . pantoprazole  40 mg Oral BID   Continuous Infusions:   LOS: 52 days   Marylu Lund, MD [facility] Pager On Amion  If 7PM-7AM, please contact night-coverage 10/25/2018, 8:46 AM

## 2018-10-26 ENCOUNTER — Ambulatory Visit: Payer: Self-pay

## 2018-10-26 LAB — GLUCOSE, CAPILLARY
Glucose-Capillary: 230 mg/dL — ABNORMAL HIGH (ref 70–99)
Glucose-Capillary: 144 mg/dL — ABNORMAL HIGH (ref 70–99)
Glucose-Capillary: 137 mg/dL — ABNORMAL HIGH (ref 70–99)
Glucose-Capillary: 158 mg/dL — ABNORMAL HIGH (ref 70–99)

## 2018-10-26 NOTE — Progress Notes (Signed)
Occupational Therapy Treatment Patient Details Name: Ryan Medina MRN: [id number] DOB: August 03, 1961 Today's Date: 10/26/2018    History of present illness 58 y.o. male with PMH including but not limited to DM and HTN who presented to the hospital on 12/13 with a right ischemic leg secondary to acute popliteal and tibial embolus, underwent embolectomy on 12/13 and subsequently was placed on a heparin drip.  He unfortunately developed a acute right lower leg hematoma with compartment syndrome requiring fasciotomy on 12/15.  Further hospital course was complicated by development of hemorrhagic shock with acute blood loss anemia secondary to a bleeding duodenal ulcer-requiring ICU transfer-intubation for airway protection and IR evaluation with mesenteric angiogram and GDA embolization.  Upon stability he was [facility]. Pt is now s/p closure of R LE fasciotomy on 09/10/18. 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  Plans to transfer to [facility] for radiation treatment.    OT comments  Pt with increased participation this day.  Follow Up Recommendations  SNF    Equipment Recommendations  None recommended by OT    Recommendations for Other Services      Precautions / Restrictions Precautions Precautions: Fall Precaution Comments: pt is currently hopping on one leg due to R LE pain Other Brace: obtained a CAM boot for right  ankle hoping to tolerate  more WB, did not change the pain with dependency of right foot. Restrictions Weight Bearing Restrictions: No RLE Weight Bearing: Weight bearing as tolerated       Mobility Bed Mobility Overal bed mobility: Needs Assistance Bed Mobility: Supine to Sit     Supine to sit: Modified independent (Device/Increase time)        Transfers Overall transfer level: Needs assistance Equipment used: Rolling walker (2 wheeled) Transfers: Sit to/from [employer] Sit to Stand: Min guard Stand pivot transfers: Min guard            Balance Overall balance assessment: Needs assistance   Sitting balance-Leahy Scale: Good     Standing balance support: Bilateral upper extremity supported Standing balance-Leahy Scale: Poor                             ADL either performed or assessed with clinical judgement   ADL Overall ADL's : Needs assistance/impaired Eating/Feeding: Independent;Sitting   Grooming: Set up;Sitting       Lower Body Bathing: Minimal assistance;Sit to/from stand;Cueing for sequencing;Cueing for safety   Upper Body Dressing : Set up;Sitting   Lower Body Dressing: Minimal assistance;Sit to/from stand;Cueing for sequencing;Cueing for safety   Toilet Transfer: Min guard;RW   Toileting- Clothing Manipulation and Hygiene: Minimal assistance;Sit to/from stand         General ADL Comments: Pt did perform functional moblity this day with OT and was agreeable.  Pt overall min guard A               Cognition Arousal/Alertness: Awake/alert Behavior During Therapy: WFL for tasks assessed/performed Overall Cognitive Status: Within Functional Limits for tasks assessed                                                     Pertinent Vitals/ Pain       Pain Score: 7  Pain Location: R foot Pain Descriptors / Indicators: Sore Pain Intervention(s): Limited activity within patient's tolerance;Repositioned  Home Living                                              Frequency  Min 2X/week        Progress Toward Goals  OT Goals(current goals can now be found in the care plan section)  Progress towards OT goals: Progressing toward goals     Plan Discharge plan remains appropriate       AM-PAC OT "6 Clicks" Daily Activity     Outcome Measure   Help from another person eating meals?: None Help from another person taking care of personal grooming?: A  Little Help from another person toileting, which includes using toliet, bedpan, or urinal?: A Little Help from another person bathing (including washing, rinsing, drying)?: A Little Help from another person to put on and taking off regular upper body clothing?: None Help from another person to put on and taking off regular lower body clothing?: A Little 6 Click Score: 20    End of Session    OT Visit Diagnosis: Unsteadiness on feet (R26.81);Other abnormalities of gait and mobility (R26.89);Muscle weakness (generalized) (M62.81);Pain   Activity Tolerance Patient tolerated treatment well   Patient Left in chair;with family/visitor present   Nurse Communication Mobility status        Time: 8590-9311 OT Time Calculation (min): 12 min  Charges: OT General Charges $OT Visit: 1 Visit OT Treatments $Self Care/Home Management : 8-22 mins  Kari Baars, [place] [phone] Office- [place], [place] 10/26/2018, 2:22 PM

## 2018-10-26 NOTE — Progress Notes (Signed)
PROGRESS NOTE    Ryan Medina  TOI:[id number] DOB: 1961-03-20 DOA: 09/03/2018 PCP: Patient, No Pcp Per    Brief Narrative:  58 y.o. male admitted with LLE pain found to have ischemic right leg and acute popliteal and tibial embolus s/p embolectomy 12/13. On heparin gtt. Developed acute RLL hematoma with compartment syndrome requiring fasciotomy 12/15. Decompensated 12/17 am with hypotension with abd distention and 2 massive bloody stools. Hgb drop 9.5 ->5.9. Getting 2 units PRBC, protonix, and GI consulted. Moved to ICU for hemodynamic instability. Had embolization of GDA 09/07/2018. Transferred out of ICU 12/18.  Transferred back to ICU 1/5 overnight for hypotension and recurrent anemia. He has been given a total of 26 units of PRBCs due to ongoing GI blood loss from duodenal ulceration from local invasion of pancreatic cancer. He was ultiamtely transferred to [facility] for initiation of radiation therapy. Hemoglobin has stabilized with resolution of clinical bleeding. He has since completed radiation treatments and had port inserted. His significant medical comorbidities are complicated by an adverse social situation, i.e. homelessness. With functional impairments in mobility, the safest place to discharge would be to a skilled nursing facility to improve strength to return to prior level of functioning. Unfortunately, due to cocaine positivity on admission he has been declined by local SNF's. The only other proposed disposition has been to a shelter which is not a stable enough environment for him in this current clinical state.  Assessment & Plan:   Principal Problem:   Popliteal artery embolus (HCC) Active Problems:   DM (diabetes mellitus), type 2, uncontrolled ([place])   Essential hypertension   Dilated cardiomyopathy (HCC)   Chest pain   AAA (abdominal aortic aneurysm) (HCC)   Ischemic foot   Right leg claudication (HCC)   PVD (peripheral vascular disease) ([place])   Hemorrhagic  shock ([place])   Nontraumatic ischemic infarction of muscle of right lower leg   Upper GI bleed   Cocaine abuse ([place])   Acute blood loss anemia   Chronic pain syndrome   Duodenal ulcer with hemorrhage   Pancreatic cancer ([place])   Goals of care, counseling/discussion   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 Inoperable, but has undergone XRT.  - Dr. Marin Olp following patient, anticipating initiation of abraxane/gemcitabine as an outpatient. Had port placed 1/21.  - The patient has no transportation and would require assistance to present to frequent office visits.  - Remains stable  Acute hemorrhagic shock secondary to acute GI bleed, acute blood loss anemia, iron deficiency anemia:  - Shock resolved, GI bleeding stable, improved, due to duodenal ulcer from pancreatic CA invasion.  - Underwent EGD/hemospray 09/07/2018, GDA embolization 09/07/2018, and underwent radiation therapy with the goal of stopping bleeding.  - Has received, per report 26u PRBCs throughout hospitalization (last was 1/23). s/p IV iron per hematology/oncology, repeated 1/23.  -Continue PPI twice daily. Patient confirms willingness to take this. -GI had been following.  Sepsis likely secondary to HCAP and pseudomonas aeruginosa UTIs/p vancomycin and cefepime:  - Resolved. -Recurrent fever 1/19: In the absence of leukocytosis or localizing symptoms/signs. CXR  improved aeration. Repeat urine culture negative. Blood cultures negative. PICC pulled 1/26. - Currently stable  Ischemic  right foot due to right popliteal and tibial artery embolus:  - s/p embolectomy and bovine pericardial patch angioplasty of popliteal artery and posterior tibial artery 09/03/2018 by Dr. Scot Dock. Also had recurrent right tibial embolus in the setting of stopping anticoagulation due to significant GI bleeding s/p embolectomy 09/10/2018 at the time of fasciotomy closure. - Currently stable. Continue pain control as ordered, no indication for IV or liquid pain medications.  - Needs ongoing PT, has significant debility related to this which puts him at very high risk of readmission were he to attempt to thrive at a homeless shelter. - Continued on statin. Not on anticoagulation/antiplatelet due to hx of GI bleeding.  Chronic combined HFrEF: - Continue coreg. This is a high risk medication when outpatient due to cocaine use.  - Would recommend lasix coadministration with future PRBCs. - Currently not on ACE/ARB due to hx anaphylaxis with lisinopril.  Hypokalemia:  - Repeat supplementation and recheck intermittently. - Will repeat bmet in AM  Cocaine use: +UDS in Dec 2019.  - Cessation counseling provided.    Possible SBO: Resolved. - Stop laxatives, continue prn miralax - Currently stable.  Poorly-controlled T2DM: HbA1c 18.2% Dec 2019 (average ~432m/dl). Had hypoglycemic episode 1/24. - Continue novolog SSI,  -Had resumed lantus, noted to be hypoglycemic recently. Lantus dose decreased. Glucose in the mid 100's to low 200's, stable  Severe protein calorie malnutrition:  - Protein supplementation as able.  DVT prophylaxis: SCD's Code Status: No CPR, no ACLS, OK for intubation Family Communication: Pt in room, family at bedside Disposition Plan: Pending SNF  Consultants:   Oncology  IR  GI  Palliative care medicine team  PCCM  Vascular surgery  Procedures:   EGD 09/07/2018 by Dr. MRush Landmark  Impression: - No gross lesions in proximal esophagus. LA Grade    D esophagitis in middle and distal esophagus.. - Clotted blood in the entire stomach. J-shaped  deformity in the gastric body. - One oozing duodenal ulcer with an adherent clot  ([place] Class IIb). There is no evidence of  perforation. Attempts at removal of the clot were  not successful. Without family around, and his  clinical stability, decision made to not remove the  clot because of concern for significant hemodynamic  compromise. Hemospray was applied in effort to  decrease the risk of persistent bleeding while  awaiting next steps in evaluation/therapy as  outlined below. - No gross lesions in the second portion of the  duodenum. Recommendation: The patient will be observed post-procedure,  until all discharge criteria are met. - Return care to ICU. - Discussed case with ICU and with Interventional  Radiology. Overall, high concern for risk of  rebleeding, in setting of active oozing after just  movement of small amount of clot at the base - thus  consistent with still active bleeding. This is too  large of an area for other endoscopic therapies  other than use of Hemospray which was used today.  Next step in evaluation should be a mesenteric  angiogram and likely GDA embolization to decrease   risk as much as possible of severe hemorrhage again. - Do not place NGT for the next few days due to  severe esophagitis. - Continue IV PPI drip. - Send H. pylori serology and treat if positive. - The findings and recommendations were discussed  with the referring physician.  Upper EUS 09/16/2018 by Dr.  Ardis Hughs:  Impression:  - 3.6cm by 2.1cm mass in the neck of pancreas that  surrounds the celiac trunk, obstructs the main  pancreatic duct and appears to have also invaded  the duodenal bulb created a large, malignant,  friable ulcer. I biopsied the abnormal, firm mucosa  surrounding the duodenal ulcer and sampled the  pancreatic mass with transgastric EUS FNA.  Preliminary cytology review from the pancreatic  mass was positive for malignancy.  Recommendation: Return patient to hospital [facility] for ongoing care. - He has recieved 13 units of blood this admission,  3 of them in the past 24 hours. He will continue to  ooze from the duodenal ulcer that I suspect is  malignant, related to the nearby large pancreatic  mass. Judicious use of bloodthinners is strongly  recommended. - Await final cytology/pathology reports; both  hopefully available tomorrow.   Antimicrobials: Anti-infectives (From admission, onward)   Start     Dose/Rate Route Frequency Ordered Stop   10/11/18 2300  vancomycin  (VANCOCIN) IVPB 750 mg/150 ml premix  Status:  Discontinued     750 mg 150 mL/hr over 60 Minutes Intravenous Every 12 hours 10/11/18 1037 10/13/18 1954   10/11/18 1500  ceFAZolin (ANCEF) IVPB 2g/100 mL premix     2 g 200 mL/hr over 30 Minutes Intravenous To Radiology 10/08/18 1348 10/12/18 1500   10/11/18 1200  ceFEPIme (MAXIPIME) 1 g in sodium chloride 0.9 % 100 mL IVPB  Status:  Discontinued     1 g 200 mL/hr over 30 Minutes Intravenous Every 8 hours 10/11/18 1037 10/13/18 1954   10/11/18 1045  vancomycin (VANCOCIN) 1,250 mg in sodium chloride 0.9 % 250 mL IVPB     1,250 mg 166.7 mL/hr over 90 Minutes Intravenous  Once 10/11/18 1037 10/11/18 2057   10/07/18 1600  ceFEPIme (MAXIPIME) 1 g in sodium chloride 0.9 % 100 mL IVPB     1 g 200 mL/hr over 30 Minutes Intravenous Every 8 hours 10/07/18 1452 10/08/18 0105   10/06/18 1000  fluconazole (DIFLUCAN) tablet 100 mg  Status:  Discontinued     100 mg Oral Daily 10/06/18 0709 10/11/18 0834   10/02/18 2100  vancomycin (VANCOCIN) IVPB 750 mg/150 ml premix  Status:  Discontinued     750 mg 150 mL/hr over 60 Minutes Intravenous Every 12 hours 10/02/18 0812 10/04/18 1420   10/02/18 0900  vancomycin (VANCOCIN) 1,250 mg in sodium chloride 0.9 % 250 mL IVPB     1,250 mg 166.7 mL/hr over 90 Minutes Intravenous  Once 10/02/18 0812 10/02/18 1132   10/02/18 0900  ceFEPIme (MAXIPIME) 1 g in sodium chloride 0.9 % 100 mL IVPB  Status:  Discontinued     1 g 200 mL/hr over 30 Minutes Intravenous Every 8 hours 10/02/18 0812 10/07/18 0925   10/02/18 0815  ceFEPIme (MAXIPIME) 1 g in sodium chloride 0.9 % 100 mL IVPB  Status:  Discontinued     1 g 200 mL/hr over 30 Minutes Intravenous Every 8 hours 10/02/18 0800 10/02/18 0812   10/02/18 0800  ceFEPIme (MAXIPIME) 2 g in sodium chloride 0.9 % 100 mL IVPB  Status:  Discontinued     2 g 200 mL/hr over 30 Minutes Intravenous  Once 10/02/18 0757 10/02/18 0800   10/02/18 0800  metroNIDAZOLE (FLAGYL) IVPB 500 mg   Status:  Discontinued     500 mg 100 mL/hr over 60 Minutes Intravenous Every 8 hours 10/02/18 0757 10/02/18 0800   10/02/18 0800  vancomycin (VANCOCIN) IVPB  1000 mg/200 mL premix  Status:  Discontinued     1,000 mg 200 mL/hr over 60 Minutes Intravenous  Once 10/02/18 0757 10/02/18 0800   09/27/18 0000  ceFAZolin (ANCEF) IVPB 2g/100 mL premix     2 g 200 mL/hr over 30 Minutes Intravenous To Radiology 09/25/18 0902 09/28/18 0000   09/10/18 2130  ceFAZolin (ANCEF) IVPB 2g/100 mL premix     2 g 200 mL/hr over 30 Minutes Intravenous Every 8 hours 09/10/18 1634 09/11/18 0700   09/10/18 0800  ceFAZolin (ANCEF) IVPB 1 g/50 mL premix    Note to Pharmacy:  Send with pt to OR   1 g 100 mL/hr over 30 Minutes Intravenous On call 09/09/18 0756 09/10/18 1407   09/07/18 1300  erythromycin 250 mg in sodium chloride 0.9 % 100 mL IVPB     250 mg 100 mL/hr over 60 Minutes Intravenous Once 09/07/18 1125 09/07/18 2351   09/06/18 0000  ceFAZolin (ANCEF) IVPB 1 g/50 mL premix    Note to Pharmacy:  Send with pt to OR   1 g 100 mL/hr over 30 Minutes Intravenous On call 09/05/18 0849 09/05/18 0952   09/03/18 2245  ceFAZolin (ANCEF) IVPB 2g/100 mL premix     2 g 200 mL/hr over 30 Minutes Intravenous Every 8 hours 09/03/18 2236 09/04/18 1444   09/03/18 1615  ceFAZolin (ANCEF) IVPB 2g/100 mL premix  Status:  Discontinued     2 g 200 mL/hr over 30 Minutes Intravenous On call to O.R. 09/03/18 1600 09/03/18 2035      Subjective: No complaints at this time.  Objective: Vitals:   10/25/18 2103 10/26/18 0015 10/26/18 0017 10/26/18 0635  BP: 109/86  (!) 131/92 (!) 117/91  Pulse: 90 93 88 90  Resp: 16  16 16   Temp: 98.8 F (37.1 C)  98.9 F (37.2 C) 99.2 F (37.3 C)  TempSrc: Oral  Oral Oral  SpO2: 100% 100% 100% 99%  Weight:      Height:        Intake/Output Summary (Last 24 hours) at 10/26/2018 1527 Last data filed at 10/26/2018 0940 Gross per 24 hour  Intake 240 ml  Output -  Net 240 ml   Filed  Weights   09/28/18 1500 09/28/18 1914 10/11/18 1057  Weight: 59 kg 57.6 kg 54.7 kg    Examination: General exam: Awake, laying in bed, in nad Respiratory system: Normal respiratory effort, no wheezing  Data Reviewed: I have personally reviewed following labs and imaging studies  CBC: Recent Labs  Lab 10/20/18 0448 10/21/18 0409  WBC 10.9* 12.1*  NEUTROABS  --  10.2*  HGB 8.7* 9.5*  HCT 27.8* 29.5*  MCV 89.7 90.2  PLT 377 062   Basic Metabolic Panel: Recent Labs  Lab 10/20/18 0448 10/21/18 0409  NA 131* 133*  K 3.2* 3.8  CL 97* 95*  CO2 26 29  GLUCOSE 273* 156*  BUN 6 6  CREATININE 0.46* 0.38*  CALCIUM 7.3* 7.8*   GFR: Estimated Creatinine Clearance: 78.8 mL/min (A) (by C-G formula based on SCr of 0.38 mg/dL (L)). Liver Function Tests: Recent Labs  Lab 10/20/18 0448 10/21/18 0409  AST 17 14*  ALT 13 13  ALKPHOS 66 76  BILITOT 0.6 0.3  PROT 5.7* 6.1*  ALBUMIN 1.3* 1.3*   No results for input(s): LIPASE, AMYLASE in the last 168 hours. No results for input(s): AMMONIA in the last 168 hours. Coagulation Profile: No results for input(s): INR, PROTIME in the last 168  hours. Cardiac Enzymes: No results for input(s): CKTOTAL, CKMB, CKMBINDEX, TROPONINI in the last 168 hours. BNP (last 3 results) No results for input(s): PROBNP in the last 8760 hours. HbA1C: No results for input(s): HGBA1C in the last 72 hours. CBG: Recent Labs  Lab 10/25/18 1132 10/25/18 1712 10/25/18 2100 10/26/18 0748 10/26/18 1150  GLUCAP 193* 155* 91 230* 144*   Lipid Profile: No results for input(s): CHOL, HDL, LDLCALC, TRIG, CHOLHDL, LDLDIRECT in the last 72 hours. Thyroid Function Tests: No results for input(s): TSH, T4TOTAL, FREET4, T3FREE, THYROIDAB in the last 72 hours. Anemia Panel: No results for input(s): VITAMINB12, FOLATE, FERRITIN, TIBC, IRON, RETICCTPCT in the last 72 hours. Sepsis Labs: No results for input(s): PROCALCITON, LATICACIDVEN in the last 168  hours.  No results found for this or any previous visit (from the past 240 hour(s)).   Radiology Studies: No results found.  Scheduled Meds: . atorvastatin  20 mg Oral QHS  . carvedilol  3.125 mg Oral BID WC  . dronabinol  5 mg Oral BID AC  . feeding supplement  1 Container Oral BID BM  . feeding supplement (ENSURE ENLIVE)  237 mL Oral BID BM  . gabapentin  400 mg Oral TID PC & HS  . insulin aspart  0-9 Units Subcutaneous TID AC & HS  . insulin detemir  4 Units Subcutaneous Daily  . morphine  15 mg Oral Q12H  . pantoprazole  40 mg Oral BID   Continuous Infusions:   LOS: 53 days   Marylu Lund, MD [facility] Pager On Amion  If 7PM-7AM, please contact night-coverage 10/26/2018, 3:27 PM

## 2018-10-26 NOTE — Care Management (Signed)
Care team still attempting to solidify an appropriate DC plan for pt. Marney Doctor RN,BSN [phone]

## 2018-10-27 ENCOUNTER — Encounter (HOSPITAL_COMMUNITY): Payer: Self-pay | Admitting: Radiology

## 2018-10-27 ENCOUNTER — Inpatient Hospital Stay (HOSPITAL_COMMUNITY): Payer: Medicaid Other

## 2018-10-27 ENCOUNTER — Ambulatory Visit: Payer: Self-pay

## 2018-10-27 LAB — COMPREHENSIVE METABOLIC PANEL
ALT: 17 U/L (ref 0–44)
AST: 18 U/L (ref 15–41)
Albumin: 1.5 g/dL — ABNORMAL LOW (ref 3.5–5.0)
Alkaline Phosphatase: 84 U/L (ref 38–126)
Anion gap: 6 (ref 5–15)
BUN: 9 mg/dL (ref 6–20)
CO2: 29 mmol/L (ref 22–32)
Calcium: 7.7 mg/dL — ABNORMAL LOW (ref 8.9–10.3)
Chloride: 100 mmol/L (ref 98–111)
Creatinine, Ser: 0.39 mg/dL — ABNORMAL LOW (ref 0.61–1.24)
GFR calc Af Amer: >60 mL/min (ref >60)
GFR calc non Af Amer: >60 mL/min (ref >60)
Glucose, Bld: 237 mg/dL — ABNORMAL HIGH (ref 70–99)
Potassium: 4.1 mmol/L (ref 3.5–5.1)
SODIUM: 135 mmol/L (ref 135–145)
Total Bilirubin: 0.3 mg/dL (ref 0.3–1.2)
Total Protein: 6.4 g/dL — ABNORMAL LOW (ref 6.5–8.1)

## 2018-10-27 LAB — CBC WITH DIFFERENTIAL/PLATELET
Abs Immature Granulocytes: 0.04 10*3/uL (ref 0.00–0.07)
Basophils Absolute: 0 10*3/uL (ref 0.0–0.1)
Basophils Relative: 0 %
Eosinophils Absolute: 0 10*3/uL (ref 0.0–0.5)
Eosinophils Relative: 0 %
HCT: 28 % — ABNORMAL LOW (ref 39.0–52.0)
Hemoglobin: 8.5 g/dL — ABNORMAL LOW (ref 13.0–17.0)
Immature Granulocytes: 0 %
Lymphocytes Relative: 9 %
Lymphs Abs: 0.8 10*3/uL (ref 0.7–4.0)
MCH: 27.2 pg (ref 26.0–34.0)
MCHC: 30.4 g/dL (ref 30.0–36.0)
MCV: 89.7 fL (ref 80.0–100.0)
Monocytes Absolute: 0.7 10*3/uL (ref 0.1–1.0)
Monocytes Relative: 8 %
NRBC: 0 % (ref 0.0–0.2)
Neutro Abs: 7.6 10*3/uL (ref 1.7–7.7)
Neutrophils Relative %: 83 %
Platelets: 319 10*3/uL (ref 150–400)
RBC: 3.12 MIL/uL — ABNORMAL LOW (ref 4.22–5.81)
RDW: 14.2 % (ref 11.5–15.5)
WBC: 9.2 10*3/uL (ref 4.0–10.5)

## 2018-10-27 LAB — GLUCOSE, CAPILLARY
Glucose-Capillary: 230 mg/dL — ABNORMAL HIGH (ref 70–99)
Glucose-Capillary: 172 mg/dL — ABNORMAL HIGH (ref 70–99)
Glucose-Capillary: 118 mg/dL — ABNORMAL HIGH (ref 70–99)
Glucose-Capillary: 174 mg/dL — ABNORMAL HIGH (ref 70–99)

## 2018-10-27 MED ORDER — SODIUM CHLORIDE (PF) 0.9 % IJ SOLN
INTRAMUSCULAR | Status: AC
Start: 2018-10-27 — End: 2018-10-27
  Filled 2018-10-27: qty 50

## 2018-10-27 MED ORDER — IOHEXOL 300 MG/ML  SOLN
100.0000 mL | Freq: Once | INTRAMUSCULAR | Status: AC | PRN
  Administered 2018-10-27: 100 mL via INTRAVENOUS

## 2018-10-27 MED ORDER — IOHEXOL 300 MG/ML  SOLN: 15.0000 mL | Freq: Once | INTRAMUSCULAR | Status: DC | PRN

## 2018-10-27 MED ORDER — HEPARIN (PORCINE) 25000 UT/250ML-% IV SOLN
1200.0000 [IU]/h | INTRAVENOUS | Status: DC
  Administered 2018-10-27: 1200 [IU]/h via INTRAVENOUS
  Filled 2018-10-27: qty 250

## 2018-10-27 NOTE — Progress Notes (Signed)
I saw the CT scan report.  Unfortunately, he now has pulmonary emboli.  I suppose this is not a surprise given the fact that he has pancreatic cancer, has been bedbound for quite a while.  I think that we are going to have to try to anticoagulate him.  He has not had any obvious bleeding.  I know that he does have a duodenal ulcer.  However, I do not think that he has had any obvious bleeding.  Ryan Medina is clearly hypercoagulable.  I am sure most of this is from his underlying malignancy.  Again, we will have to risk the anticoagulation with heparin.  This can be stopped quickly.  There will not be a bolus dose.  I would keep the heparin at the low end of therapeutic.  I am sure pharmacy will be able to do this without any problems.  I just feel bad for Ryan Medina.  Thankfully, he is still in the hospital.  I think if he were to be discharged, I think this pulmonary embolism probably would have taken him away.  Again, I realize that this is a "risk-benefit" situation.  I truly believe that his coagulability is incredibly high.  He presented with a thrombus/embolus in his legs.  He had this removed.  He will clearly need to be monitored closely.  I know that the staff up on [name] will do a very good job with him.  [name], MD  [place] [phone]

## 2018-10-27 NOTE — Progress Notes (Signed)
Surprisingly, he is still in the hospital.  It seems as if there is a hard time finding a place for he and his fiance to stay.  He seems to be doing pretty well.  He is eating okay.  He is having no bleeding.  He is having no change in his pain issues.  He has pain in the right foot from the arterial embolus that was removed.  He has had no nausea or vomiting.  I will set him up with CT scans today to see how everything looks.  It looks like he is doing okay with physical therapy.  He is ambulating a little bit more.  He also is taking some occupational therapy.  He has not had labs for several days.  He has had no melena or bright red blood per rectum.  There is no change in his physical exam.  We will have to see what the CT scan shows.  I will see what his blood counts show.  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  The radiation was strictly to stop the bleeding.  I really think that an upper endoscopy would be quite helpful to see where he had the ulcer and how the radiation has helped.  I am glad that he has not been discharged yet.  The hospital staff on [name] is doing a great job with he and his fiance.  They are providing outstanding care to Mr. Quackenbush.  [name], MD  [facility] [phone]

## 2018-10-27 NOTE — Progress Notes (Signed)
[place] for Heparin - no bolus Indication: PE  Allergies  Allergen Reactions  . Lisinopril Anaphylaxis    Patient Measurements: Height: 5\' 11"  (180.3 cm) Weight: 120 lb 9.6 oz (54.7 kg) IBW/kg (Calculated) : 75.3 Heparin Dosing Weight: 66.7  Vital Signs: Temp: 99.1 F (37.3 C) (02/05 1743) Temp Source: Oral (02/05 1743) BP: 128/91 (02/05 1743) Pulse Rate: 94 (02/05 1743)  Labs: Recent Labs    10/27/18 0830  HGB 8.5*  HCT 28.0*  PLT 319  CREATININE 0.39*    Estimated Creatinine Clearance: 78.8 mL/min (A) (by C-G formula based on SCr of 0.39 mg/dL (L)).   Assessment: 58 yo male with arterial embolus s/p embolectomy. Patient had development of hematoma with compartment syndrome as well as hemorrhagic shock secondary to a bleeding duodenal ulcer.   Events this admission on 09/03/18:  LLE DVT/ischemia s/p urgent left popliteal artery embolectomy 12/13 >> RLE hematoma on 12/15 am s/p evacuation in OR.Heparin stopped 2/2 drop in hgb, hematemesis. -Heparin cautiously started 12/22,had few episodes of hematemesis & nose bleed 12/24> stopped heparin per dr. Candiss Norse S/p EUS 12/26:  pancreatic mass eroding into duodenum -[facility] notes ikely too high risk for [facility]  12/28- Dr. Candiss Norse noted Continue PPI and Hold Heparin indefinitely  2/5: pharmacy consulted to dose heparin with no bolus by Dr Marin Olp for acute PE. Previously on 12/24 his heparin level was therapeutic at 0.35 on 1200 units/hr. Hg 8.5, PLTC 319.   Goal of Therapy:  Heparin level 0.3-0.5 units/mL Monitor platelets by anticoagulation protocol: Yes     Plan: Start heparin drip with no bolus at 1200 units/hr Check heparin level 8 hrs after start- will aim for low end of therapeutic goal Daily HL and CBC while on heparin  Eudelia Bunch, Pharm.D [phone] 10/27/2018 6:01 PM

## 2018-10-27 NOTE — Progress Notes (Addendum)
PROGRESS NOTE    Ryan Medina  WUJ:[id number] DOB: 1961/04/20 DOA: 09/03/2018 PCP: Patient, No Pcp Per   Brief Narrative:  58 year old with history of peripheral vascular disease, dilated cardiomyopathy, essential hypertension, diabetes mellitus type 2 initially came to the hospital with right lower extremity pain and was found to have ischemic leg requiring embolectomy on 12/13.  He was placed on heparin drip subsequently developed right lower extremity hematoma requiring fasciotomy on 12/15.  Unfortunately afterwards he decompensated with active GI bleed and was intubated and was moved to the ICU.  He required embolization on 12/17.  During the course of his hospitalization he is required total of 26 units of blood.  Further evaluation showed pancreatic cancer with duodenal ulcer.  Transferred to [facility] for radiation treatment to help with bleeding.  Hospital course complicated by sepsis from healthcare acquired pneumonia and Pseudomonas UTI requiring treatment with vancomycin and cefepime.   Assessment & Plan:   Principal Problem:   Popliteal artery embolus (HCC) Active Problems:   DM (diabetes mellitus), type 2, uncontrolled ([place])   Essential hypertension   Dilated cardiomyopathy (HCC)   Chest pain   AAA (abdominal aortic aneurysm) (HCC)   Ischemic foot   Right leg claudication (HCC)   PVD (peripheral vascular disease) ([place])   Hemorrhagic shock ([place])   Nontraumatic ischemic infarction of muscle of right lower leg   Upper GI bleed   Cocaine abuse ([place])   Acute blood loss anemia   Chronic pain syndrome   Duodenal ulcer with hemorrhage   Pancreatic cancer ([place])   Goals of care, counseling/discussion   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  Status post radiation.  Per oncology plans for outpatient radiation. -Status post endoscopy 12/17, GDA embolization 12/17, status post radiation. - Continue PPI. - At oncology request, we consulted GI to see if patient needs repeat endoscopy to look for any changes in the ulcer. Unfortunately patient has poor prognosis given his advanced malignancy.  His exact prognosis depending on his response to chemotherapy which is planned and how he tolerates it.  Sepsis secondary to H CAP and Pseudomonas urinary tract infection -Status post treatment with vancomycin and cefepime.  PICC line pulled out 1/26.  Ischemic right foot with right popliteal and tibial artery embolus -Status post embolectomy.  Complicated by compartment syndrome requiring fasciotomy. -Not on anticoagulation due to GI bleeding.  Continue statin.  Supportive care  Cardiomyopathy, combined systolic and diastolic CHF, chronic -Patient appears to be euvolemic in nature.  Continue to provide supportive care.  Uncontrolled diabetes mellitus type 2 -Hemoglobin A1c 18.2.  Continue current regimen of Lantus, Accu-Chek and sliding scale  Severe protein calorie malnutrition - Protein supplement ordered  Generalized weakness-physical therapy/Occupational Therapy-recommending skilled nursing facility   ADDENDUM 10/27/18 130PM: Spoke with the radiologist regarding CT scan finding.  It is noted the patient has chronic pulmonary embolism and also distended stomach.  Unable to treat chronic pulmonary embolism with anticoagulation due to very high risk of life-threatening GI bleeding.  We will keep a close  eye on distended stomach.  If he develops nausea and vomiting will place NG  tube. Appreciate their input   DVT prophylaxis: SCDs Code Status: Okay for intubation, no CPR.  Partial code Family Communication: Significant other at bedside Disposition Plan: Pending placement  Consultants:   Gastroenterology  Oncology  Interventional radiology  Critical care  Vascular surgery  Palliative care  Procedures:   Endoscopy 12/17  EUS 12/26  Chemo-Port placement January 21  Antimicrobials:   Completed course of vancomycin and cefepime   Subjective: Patient feels better this morning, no complaints.  Tolerating oral diet.  Review of Systems Otherwise negative except as per HPI, including: General: Denies fever, chills, night sweats or unintended weight loss. Resp: Denies cough, wheezing, shortness of breath. Cardiac: Denies chest pain, palpitations, orthopnea, paroxysmal nocturnal dyspnea. GI: Denies abdominal pain, nausea, vomiting, diarrhea or constipation GU: Denies dysuria, frequency, hesitancy or incontinence MS: Denies muscle aches, joint pain or swelling Neuro: Denies headache, neurologic deficits (focal weakness, numbness, tingling), abnormal gait Psych: Denies anxiety, depression, SI/HI/AVH Skin: Denies new rashes or lesions ID: Denies sick contacts, exotic exposures, travel  Objective: Vitals:   10/26/18 0635 10/26/18 1558 10/26/18 2057 10/27/18 0738  BP: (!) 117/91 (!) 121/91 (!) 127/98 116/88  Pulse: 90 (!) 105 99 91  Resp: 16 16 16 16   Temp: 99.2 F (37.3 C) 98.7 F (37.1 C) 99.7 F (37.6 C) 99 F (37.2 C)  TempSrc: Oral Oral Oral Oral  SpO2: 99% 100% 98% 100%  Weight:      Height:        Intake/Output Summary (Last 24 hours) at 10/27/2018 1125 Last data filed at 10/27/2018 0240 Gross per 24 hour  Intake 240 ml  Output 650 ml  Net -410 ml   Filed Weights   09/28/18 1500 09/28/18 1914 10/11/18 1057  Weight: 59 kg 57.6 kg 54.7 kg    Examination:  General exam: Appears calm and comfortable, bilateral temporal  wasting, cachexia Respiratory system: Clear to auscultation. Respiratory effort normal. Cardiovascular system: S1 & S2 heard, RRR. No JVD, murmurs, rubs, gallops or clicks. No pedal edema. Gastrointestinal system: Abdomen is nondistended, soft and nontender. No organomegaly or masses felt. Normal bowel sounds heard. Central nervous system: Alert and oriented. No focal neurological deficits. Extremities: Symmetric 5 x 5 power. Skin: No rashes, lesions or ulcers Psychiatry: Judgement and insight appear normal. Mood & affect appropriate.     Data Reviewed:   CBC: Recent Labs  Lab 10/21/18 0409 10/27/18 0830  WBC 12.1* 9.2  NEUTROABS 10.2* 7.6  HGB 9.5* 8.5*  HCT 29.5* 28.0*  MCV 90.2 89.7  PLT 373 811   Basic Metabolic Panel: Recent Labs  Lab 10/21/18 0409 10/27/18 0830  NA 133* 135  K 3.8 4.1  CL 95* 100  CO2 29 29  GLUCOSE 156* 237*  BUN 6 9  CREATININE 0.38* 0.39*  CALCIUM 7.8* 7.7*   GFR: Estimated Creatinine Clearance: 78.8 mL/min (A) (by C-G formula based on SCr of 0.39 mg/dL (L)). Liver Function Tests: Recent Labs  Lab 10/21/18 0409 10/27/18 0830  AST 14* 18  ALT 13 17  ALKPHOS 76 84  BILITOT 0.3 0.3  PROT 6.1* 6.4*  ALBUMIN 1.3* 1.5*   No results for input(s): LIPASE, AMYLASE in the last 168 hours. No results for input(s): AMMONIA in the last 168 hours. Coagulation Profile: No results for input(s): INR, PROTIME in the last 168 hours. Cardiac Enzymes: No results for input(s): CKTOTAL, CKMB, CKMBINDEX, TROPONINI in the last  168 hours. BNP (last 3 results) No results for input(s): PROBNP in the last 8760 hours. HbA1C: No results for input(s): HGBA1C in the last 72 hours. CBG: Recent Labs  Lab 10/26/18 0748 10/26/18 1150 10/26/18 1732 10/26/18 2054 10/27/18 0738  GLUCAP 230* 144* 137* 158* 230*   Lipid Profile: No results for input(s): CHOL, HDL, LDLCALC, TRIG, CHOLHDL, LDLDIRECT in the last 72 hours. Thyroid Function Tests: No results for  input(s): TSH, T4TOTAL, FREET4, T3FREE, THYROIDAB in the last 72 hours. Anemia Panel: No results for input(s): VITAMINB12, FOLATE, FERRITIN, TIBC, IRON, RETICCTPCT in the last 72 hours. Sepsis Labs: No results for input(s): PROCALCITON, LATICACIDVEN in the last 168 hours.  No results found for this or any previous visit (from the past 240 hour(s)).       Radiology Studies: No results found.      Scheduled Meds: . atorvastatin  20 mg Oral QHS  . carvedilol  3.125 mg Oral BID WC  . dronabinol  5 mg Oral BID AC  . feeding supplement  1 Container Oral BID BM  . feeding supplement (ENSURE ENLIVE)  237 mL Oral BID BM  . gabapentin  400 mg Oral TID PC & HS  . insulin aspart  0-9 Units Subcutaneous TID AC & HS  . insulin detemir  4 Units Subcutaneous Daily  . morphine  15 mg Oral Q12H  . pantoprazole  40 mg Oral BID  . sodium chloride (PF)       Continuous Infusions:   LOS: 54 days   Time spent= 25 mins    Ankit Arsenio Loader, MD [facility]  If 7PM-7AM, please contact night-coverage [email or web address] 10/27/2018, 11:25 AM

## 2018-10-27 NOTE — Progress Notes (Signed)
Physical Therapy Treatment Patient Details Name: Federico Maiorino Burford MRN: [id number] DOB: 03/04/1961 Today's Date: 10/27/2018    History of Present Illness 58 y.o. male with PMH including but not limited to DM and HTN who presented to the hospital on 12/13 with a right ischemic leg secondary to acute popliteal and tibial embolus, underwent embolectomy on 12/13 and subsequently was placed on a heparin drip.  He unfortunately developed a acute right lower leg hematoma with compartment syndrome requiring fasciotomy on 12/15.  Further hospital course was complicated by development of hemorrhagic shock with acute blood loss anemia secondary to a bleeding duodenal ulcer-requiring ICU transfer-intubation for airway protection and IR evaluation with mesenteric angiogram and GDA embolization.  Upon stability he was transferred back to the [facility] on 12/19. Pt is now s/p closure of R LE fasciotomy on 09/10/18. 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  Plans to transfer to [facility] for radiation treatment.     PT Comments    Pt able to self rise OOB and amb a functional distance.    Follow Up Recommendations  (TBD)     Equipment Recommendations  Rolling walker with 5" wheels;3in1 (PT);Wheelchair (measurements PT);Wheelchair cushion (measurements PT)    Recommendations for Other Services       Precautions / Restrictions Precautions Precautions: Fall Precaution Comments: pt is currently hopping on one leg due to R LE pain Other Brace: pt declines wearing CAM boot "It hurts and it's heavy" Restrictions Weight Bearing Restrictions: No RLE Weight Bearing: Weight bearing as tolerated    Mobility  Bed Mobility Overal bed mobility: Modified Independent             General bed mobility comments: pt self able  Transfers Overall transfer level: Modified independent Equipment used: Rolling walker (2 wheeled) Transfers: Sit to/from [employer] transfer comment: pt self able   Ambulation/Gait Ambulation/Gait assistance: Supervision Gait Distance (Feet): 30 Feet Assistive device: Rolling walker (2 wheeled)   Gait velocity:  decreased   General Gait Details: "hopping" as pt declines to put any part of his R foot on floor due to pain.  "hopping" a functional distance.     Stairs             Wheelchair Mobility    Modified Rankin (Stroke Patients Only)       Balance                                            Cognition Arousal/Alertness: Awake/alert Behavior During Therapy: WFL for tasks assessed/performed Overall Cognitive Status: Within Functional Limits for tasks assessed                                 General Comments: AxO x 4       Exercises      General Comments        Pertinent Vitals/Pain Pain Assessment: Faces Faces Pain Scale: Hurts little more Pain Location: R foot (dorsal) Pain Descriptors / Indicators: Grimacing Pain Intervention(s): Monitored during session;Repositioned    Home Living                      Prior Function  PT Goals (current goals can now be found in the care plan section) Progress towards PT goals: Progressing toward goals    Frequency    Min 2X/week      PT Plan Current plan remains appropriate    Co-evaluation              AM-PAC PT "6 Clicks" Mobility   Outcome Measure  Help needed turning from your back to your side while in a flat bed without using bedrails?: None Help needed moving from lying on your back to sitting on the side of a flat bed without using bedrails?: None   Help needed standing up from a chair using your arms (e.g., wheelchair or bedside chair)?: None Help needed to walk in hospital room?: A Little Help needed climbing 3-5 steps with a railing? : A Lot 6 Click Score: 17    End of Session   Activity Tolerance: Patient limited by pain    Nurse Communication: Mobility status PT Visit Diagnosis: Other abnormalities of gait and mobility (R26.89);Pain Pain - Right/Left: Right     Time: 9833-8250 PT Time Calculation (min) (ACUTE ONLY): 13 min  Charges:  $Gait Training: 8-22 mins                     Rica Koyanagi  PTA Acute  Rehabilitation Services Pager      [phone] Office      [phone]

## 2018-10-27 NOTE — Progress Notes (Addendum)
Patient ID: Ryan Medina, male   DOB: 05/23/1961, 58 y.o.   MRN: [id number]    Progress Note   Subjective   Gi  Asked to re-evaluate pt regarding follow up EGD. Pt is known to GI service from earlier this admission when he had a massive upper GI bleed with shock in mid December. He had undergone a LE embolectomy a few days prior an had been on heparin .EGD revealed Grade D esophagitis, and a large clot was found in duodenum overlying a 6 cm ulcer which continued to ooze. He was unstable, required pressors etc and eventually underwent embolization of gastroduodenal l artery for control of bleed. Ct angio suggested an ill defined mass in neck of pancreas , and Pancreatic ductal dilation. MRI confirmed, and he underwent EUS/EGD per Dr Ardis Hughs 12/26 /19  With 3.6 x 2.1 cm pancreatic neck mass confirmed, this was encasing the celiac trunk and abutting the SMA.  Also found a large malignant duodenal ulcer which was sampled for cytology.  Path on the pancreatic mass returned showing adenocarcinoma,, and tissue from the ulcer showed ulceration and inflammatory exudate but no definite malignancy.Marland Kitchen  He re-bled again on 09/27/2018, at that time was managed very aggressively medically.  IR was reconsulted but decided to hold off on attempt at repeat angios and embolization and bleeding subsided. He has since been transferred to [facility] long, and under Dr. Antonieta Pert care has completed a course of radiation as of 10/13/2018.  Patient has not had any active bleeding over the past several weeks.  His appetite at this point is very good is not complaining of any abdominal pain.  Housing has been an issue as patient had been in a shelter prior to admission has been part of the reason to keep him inpatient to undergo treatment.  Dr. Marin Olp has ordered CT of the chest abdomen and pelvis today to reassess and help make decisions regarding proceeding with potential chemotherapy.  As part of restaging he has requested repeat upper  endoscopy.     Objective   Vital signs in last 24 hours: Temp:  [98.7 F (37.1 C)-99.7 F (37.6 C)] 99 F (37.2 C) (02/05 0738) Pulse Rate:  [91-105] 91 (02/05 0738) Resp:  [16] 16 (02/05 0738) BP: (116-127)/(88-98) 116/88 (02/05 0738) SpO2:  [98 %-100 %] 100 % (02/05 0738) Last BM Date: 10/24/18 General:    Thin AA male  in NAD Heart:  Regular rate and rhythm; no murmurs Lungs: Respirations even and unlabored, lungs CTA bilaterally Abdomen:  Soft, distended and tympanitic, no definite mass or hepatosplenomegaly no significant abdominal tenderness.   Extremities: Right lower extremity/ankle foot wrapped, several ischemic appearing ulcers on the toes Neurologic:  Alert and oriented,  grossly normal neurologically. Psych:  Cooperative. Normal mood and affect.  Intake/Output from previous day: 02/04 0701 - 02/05 0700 In: 480 [P.O.:480] Out: 650 [Urine:650] Intake/Output this shift: No intake/output data recorded.  Lab Results: Recent Labs    10/27/18 0830  WBC 9.2  HGB 8.5*  HCT 28.0*  PLT 319   BMET Recent Labs    10/27/18 0830  NA 135  K 4.1  CL 100  CO2 29  GLUCOSE 237*  BUN 9  CREATININE 0.39*  CALCIUM 7.7*   LFT Recent Labs    10/27/18 0830  PROT 6.4*  ALBUMIN 1.5*  AST 18  ALT 17  ALKPHOS 84  BILITOT 0.3   PT/INR No results for input(s): LABPROT, INR in the last 72 hours.  Assessment / Plan:     #67 58 year old African-American male with pancreatic adenocarcinoma noticed December 2019, tumor found to be encasing the celiac trunk and abutting the SMA and invading the duodenum with resultant large deep cratered 6 cm ligament appearing ulcer.  Initial tissue from the ulcer was not confirmatory of malignancy.  Patient had a massive life-threatening GI hemorrhage from this ulcer prior to diagnosis of pancreatic adenocarcinoma being made.  He required upwards of 15 units of packed RBCs and ultimately required an embolization of the  gastroduodenal ulcer for control of bleeding.  He has now completed a course of radiation as of 10/13/2018, and assessment/restaging is in progress, before decision to proceed with chemotherapy.  CT of the chest abdomen and pelvis pending today  #2 anemia secondary to blood loss stable #3 peripheral vascular disease #4 history of polysubstance abuse #5 homelessness   Plan; Repeat EGD is certainly reasonable specially given previous life-threatening massive hemorrhage from the huge malignant duodenal ulcer. We have discussed repeat EGD with the patient including indications risks and benefits and he is agreeable to proceed. Patient is scheduled for upper endoscopy with Dr. Loletha Carrow tomorrow morning.  Further recommendations and findings at EGD, and today's imaging    Addendum ; CT scan show bilateral pulmonary emboli probably chronic, enlargement of the pancreatic mass, dilated stomach fluid and food filled cannot exclude gastric outlet obstruction.  Also with significant third spacing as well as gastric and omental varices.  We will cancel upper endoscopy for tomorrow, place patient on a clear liquid diet over the next 36 hours and probably attempt EGD on Friday.  He is still at high risk of bleeding complications from the large malignant duodenal ulcer.  If decision made to anticoagulate please use heparin as easily reversible.   Principal Problem:   Popliteal artery embolus (HCC) Active Problems:   DM (diabetes mellitus), type 2, uncontrolled ([place])   Essential hypertension   Dilated cardiomyopathy (HCC)   Chest pain   AAA (abdominal aortic aneurysm) (HCC)   Ischemic foot   Right leg claudication (HCC)   PVD (peripheral vascular disease) ([place])   Hemorrhagic shock ([place])   Nontraumatic ischemic infarction of muscle of right lower leg   Upper GI bleed   Cocaine abuse ([place])   Acute blood loss anemia   Chronic pain syndrome   Duodenal ulcer with hemorrhage   Pancreatic cancer  ([place])   Goals of care, counseling/discussion   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 I personally interviewed and examined the patient.  CC: Duodenal ulcer  I saw this patient along with my PA earlier today, I also did extensive chart review of previous labs, imaging and endoscopy and biopsy reports.  I personally reviewed the CT scan of today showing advanced pancreatic malignancy with probable high-grade gastric outlet obstruction.  That area is difficult to fully evaluate due to beam hardening artifact from the GDA coils placed when the patient had massive upper GI bleeding from the malignant duodenal ulcer. There are bilateral pulmonary emboli new from prior  imaging probable gastric varices, along with other incidental findings as noted in the report.  It is surprising that he has not been vomiting with this appearance of stomach with a large amount of retained material. Given these findings, it is unlikely that the pancreatic mass has responded much at all to radiation, at least as far as its compression of the duodenum and ulcer.  I am not sure whether it will change the plan for this patient's treatment, but I am willing to perform an upper endoscopy to define the anatomy and assess for the presence of ulcer.  If there is a large duodenal malignant ulcer as before, then any gastric outlet obstruction from this mass would not likely be amenable to stent placement. We were tentatively scheduled for an upper endoscopy tomorrow, but after seeing the CT report, we have moved her  to the following day so there can be a longer period of time with him on a clear liquid diet to hopefully have an empty stomach and allow a safer and more effective procedure.  Total time 40 minutes   Nelida Meuse III Office: [phone]

## 2018-10-28 ENCOUNTER — Encounter (HOSPITAL_COMMUNITY)
Admission: EM | Disposition: A | Discharge status: Skilled Nursing Facility | Payer: Self-pay | Source: Home / Self Care | Attending: Internal Medicine

## 2018-10-28 ENCOUNTER — Ambulatory Visit: Payer: Self-pay

## 2018-10-28 DIAGNOSIS — C250 Malignant neoplasm of head of pancreas: Secondary | ICD-10-CM

## 2018-10-28 LAB — CBC
HCT: 27.9 % — ABNORMAL LOW (ref 39.0–52.0)
Hemoglobin: 8.7 g/dL — ABNORMAL LOW (ref 13.0–17.0)
MCH: 28.3 pg (ref 26.0–34.0)
MCHC: 31.2 g/dL (ref 30.0–36.0)
MCV: 90.9 fL (ref 80.0–100.0)
NRBC: 0 % (ref 0.0–0.2)
Platelets: 354 10*3/uL (ref 150–400)
RBC: 3.07 MIL/uL — ABNORMAL LOW (ref 4.22–5.81)
RDW: 14 % (ref 11.5–15.5)
WBC: 10.5 10*3/uL (ref 4.0–10.5)

## 2018-10-28 LAB — GLUCOSE, CAPILLARY
Glucose-Capillary: 191 mg/dL — ABNORMAL HIGH (ref 70–99)
Glucose-Capillary: 169 mg/dL — ABNORMAL HIGH (ref 70–99)
Glucose-Capillary: 49 mg/dL — ABNORMAL LOW (ref 70–99)
Glucose-Capillary: 51 mg/dL — ABNORMAL LOW (ref 70–99)
Glucose-Capillary: 151 mg/dL — ABNORMAL HIGH (ref 70–99)
Glucose-Capillary: 153 mg/dL — ABNORMAL HIGH (ref 70–99)

## 2018-10-28 LAB — HEPARIN LEVEL (UNFRACTIONATED)
Heparin Unfractionated: 0.13 IU/mL — ABNORMAL LOW (ref 0.30–0.70)
Heparin Unfractionated: <0.1 IU/mL — ABNORMAL LOW (ref 0.30–0.70)
Heparin Unfractionated: 0.27 IU/mL — ABNORMAL LOW (ref 0.30–0.70)

## 2018-10-28 SURGERY — ESOPHAGOGASTRODUODENOSCOPY (EGD) WITH PROPOFOL
Anesthesia: Monitor Anesthesia Care

## 2018-10-28 MED ORDER — HEPARIN (PORCINE) 25000 UT/250ML-% IV SOLN
1450.0000 [IU]/h | INTRAVENOUS | Status: DC
  Administered 2018-10-28: 1450 [IU]/h via INTRAVENOUS
  Filled 2018-10-28: qty 250

## 2018-10-28 MED ORDER — DEXTROSE-NACL 5-0.45 % IV SOLN
INTRAVENOUS | Status: DC
  Administered 2018-10-28 – 2018-11-05 (×9): via INTRAVENOUS

## 2018-10-28 MED ORDER — DEXTROSE 50 % IV SOLN
INTRAVENOUS | Status: AC
  Administered 2018-10-28: 17:00:00
  Filled 2018-10-28: qty 50

## 2018-10-28 MED ORDER — HEPARIN (PORCINE) 25000 UT/250ML-% IV SOLN
1500.0000 [IU]/h | INTRAVENOUS | Status: AC
  Administered 2018-10-29: 1500 [IU]/h via INTRAVENOUS
  Filled 2018-10-28 (×2): qty 250

## 2018-10-28 MED ORDER — HEPARIN (PORCINE) 25000 UT/250ML-% IV SOLN
1300.0000 [IU]/h | INTRAVENOUS | Status: DC
  Administered 2018-10-28: 1300 [IU]/h via INTRAVENOUS
  Filled 2018-10-28: qty 250

## 2018-10-28 MED ORDER — DEXTROSE 50 % IV SOLN
50.0000 mL | Freq: Once | INTRAVENOUS | Status: AC
  Administered 2018-10-28: 50 mL via INTRAVENOUS

## 2018-10-28 NOTE — Progress Notes (Signed)
Hypoglycemic Event  CBG:49  Treatment: 8 oz juice  Symptoms: dizzy  Follow-up CBG: 1645 Time CBG Result:51  Possible Reasons for Event:   Comments/MD notified in process    [place], Tyannah Sane Ione

## 2018-10-28 NOTE — Progress Notes (Addendum)
Patient ID: Ryan Medina, male   DOB: 1961/04/27, 58 y.o.   MRN: [id number]     Progress Note   Subjective  C/o hunger, no abdominal pain, no nausea or vomiting Tolerating clears  Says stool is brown   Heparin stated last pm   Hgb 8.5 yesterday , pending today    Objective   Vital signs in last 24 hours: Temp:  [98.7 F (37.1 C)-99.4 F (37.4 C)] 98.7 F (37.1 C) (02/06 0607) Pulse Rate:  [93-95] 94 (02/06 0607) Resp:  [16-20] 20 (02/06 0607) BP: (125-134)/(86-99) 129/98 (02/06 0607) SpO2:  [98 %-100 %] 98 % (02/06 0607) Last BM Date: 10/27/18 General:    AA male in NAD very cachectic Heart:  Regular rate and rhythm; no murmurs Lungs: Respirations even and unlabored, lungs CTA bilaterally Abdomen:  Protuberant ,full feeling, no focal tenderness  BS +., no definite palp mass  Extremities:  Without edema. Neurologic:  Alert and oriented,  grossly normal neurologically. Psych:  Cooperative. Normal mood and affect.  Intake/Output from previous day: 02/05 0701 - 02/06 0700 In: 575.4 [P.O.:480; I.V.:95.4] Out: 1125 [Urine:1125] Intake/Output this shift: No intake/output data recorded.  Lab Results: Recent Labs    10/27/18 0830  WBC 9.2  HGB 8.5*  HCT 28.0*  PLT 319   BMET Recent Labs    10/27/18 0830  NA 135  K 4.1  CL 100  CO2 29  GLUCOSE 237*  BUN 9  CREATININE 0.39*  CALCIUM 7.7*   LFT Recent Labs    10/27/18 0830  PROT 6.4*  ALBUMIN 1.5*  AST 18  ALT 17  ALKPHOS 84  BILITOT 0.3   PT/INR No results for input(s): LABPROT, INR in the last 72 hours.  Studies/Results: Ct Chest W Contrast  Addendum Date: 10/27/2018   ADDENDUM REPORT: 10/27/2018 13:41 ADDENDUM: The original report was by Dr. Van Clines. The following addendum is by Dr. Van Clines: Critical Value/emergent results were called by telephone at the time of interpretation on 10/27/2018 at 1:40 pm to Dr. Gerlean Ren , who verbally acknowledged these results. Electronically  Signed   By: Van Clines M.D.   On: 10/27/2018 13:41   Result Date: 10/27/2018 CLINICAL DATA:  Pancreatic adenocarcinoma with duodenal ulcer, chemotherapy and radiation therapy in progress. EXAM: CT CHEST, ABDOMEN, AND PELVIS WITH CONTRAST TECHNIQUE: Multidetector CT imaging of the chest, abdomen and pelvis was performed following the standard protocol during bolus administration of intravenous contrast. CONTRAST:  143mL OMNIPAQUE IOHEXOL 300 MG/ML  SOLN COMPARISON:  Multiple exams, including CT abdomen from 09/27/2018 FINDINGS: CT CHEST FINDINGS Cardiovascular: Today's exam was not performed for pulmonary arterial assessment, but there is mostly peripheral and segmental right lower lobe filling defect in the pulmonary artery compatible with pulmonary embolus, likely chronic. Similar peripheral segmental and subsegmental filling defects in the left upper lobe and left lower lobe pulmonary arteries. Right Port-A-Cath tip: Cavoatrial junction. Atherosclerotic calcification of the aortic arch and branch vessels. Mediastinum/Nodes: Hazy density in the mediastinal adipose tissue as well as the subcutaneous adipose tissue possibly from third spacing of fluid. Right lower paratracheal node 1.1 cm in short axis on image 42/7. Lungs/Pleura: Bandlike scarring or atelectasis in the left lower lobe at the site of prior airspace opacity, mildly improved although there is some continued anterior airspace opacity. Musculoskeletal: Small oblong sclerotic lesion in the left eighth rib lateral, not changed from 08/22/2018 common no cortical irregularity or cortical destruction. CT ABDOMEN PELVIS FINDINGS Hepatobiliary: Lateral segment left hepatic lobe  cyst on image 88/7. Potential 4 mm hypodense lesion in the right hepatic lobe on image 102/7, technically nonspecific. Pancreas: Highly ill-defined pancreas. Indistinct stranding around the celiac trunk and superior mesenteric artery. Hypodensity favoring pancreatic body mass  measuring about 2.4 by 4.1 cm on image 108/7. Dilated dorsal pancreatic duct. Spleen: Unremarkable. Adrenals/Urinary Tract: Small hypodense renal lesions are technically too small to characterize although statistically likely to be benign. Adrenal glands unremarkable. Stomach/Bowel: Prominently dilated stomach filled with food material. Poor definition of the duodenum. I can not exclude gastric outlet obstruction. Presumed vascular coils in the gastroduodenal artery. Poor definition of bowel wall due to the diffuse edema. Vascular/Lymphatic: Mild gastric varices. Infrarenal abdominal aortic aneurysm 3.1 cm in diameter. Aneurysmal bilateral common iliac arteries. Ectatic external iliac arteries. The portal vein and SMV remain patent. Varices are noted along the omentum and stomach. Reproductive: Unremarkable Other: Diffuse subcutaneous and mesenteric edema. Musculoskeletal: Spondylosis and degenerative disc disease at L5-S1. IMPRESSION: 1. Chronic appearing bilateral pulmonary embolus most notably involving the right lower lobe. 2. Mild enlargement of the pancreatic mass. No findings of metastatic disease to the liver currently. Small cyst in the lateral segment left hepatic lobe. 3. Notable dilatation of the stomach which is filled with food. This raises the possibility of gastric outlet obstruction or narrowing along the descending duodenum. No bowel dilatation. 4. Third spacing of fluid with diffuse subcutaneous, mesenteric, and mediastinal edema. 5. Gastric and omental varices likely from portal venous hypertension. 6. Mild aneurysmal dilatation of the abdominal aorta at 3.1 cm with aneurysmal iliac arteries. Recommend followup by [place] in 3 years. This recommendation follows ACR consensus guidelines: White Paper of the ACR Incidental Findings Committee II on Vascular Findings. J Am Coll Radiol 2013; 10:789-794. 7.  Aortic Atherosclerosis (ICD10-I70.0). Radiology assistant personnel have been notified to put me in  telephone contact with the referring physician or the referring physician's clinical representative in order to discuss these findings. Once this communication is established I will issue an addendum to this report for documentation purposes. Electronically Signed: By: Van Clines M.D. On: 10/27/2018 13:00   Ct Abdomen Pelvis W Contrast  Addendum Date: 10/27/2018   ADDENDUM REPORT: 10/27/2018 13:41 ADDENDUM: The original report was by Dr. Van Clines. The following addendum is by Dr. Van Clines: Critical Value/emergent results were called by telephone at the time of interpretation on 10/27/2018 at 1:40 pm to Dr. Gerlean Ren , who verbally acknowledged these results. Electronically Signed   By: Van Clines M.D.   On: 10/27/2018 13:41   Result Date: 10/27/2018 CLINICAL DATA:  Pancreatic adenocarcinoma with duodenal ulcer, chemotherapy and radiation therapy in progress. EXAM: CT CHEST, ABDOMEN, AND PELVIS WITH CONTRAST TECHNIQUE: Multidetector CT imaging of the chest, abdomen and pelvis was performed following the standard protocol during bolus administration of intravenous contrast. CONTRAST:  166mL OMNIPAQUE IOHEXOL 300 MG/ML  SOLN COMPARISON:  Multiple exams, including CT abdomen from 09/27/2018 FINDINGS: CT CHEST FINDINGS Cardiovascular: Today's exam was not performed for pulmonary arterial assessment, but there is mostly peripheral and segmental right lower lobe filling defect in the pulmonary artery compatible with pulmonary embolus, likely chronic. Similar peripheral segmental and subsegmental filling defects in the left upper lobe and left lower lobe pulmonary arteries. Right Port-A-Cath tip: Cavoatrial junction. Atherosclerotic calcification of the aortic arch and branch vessels. Mediastinum/Nodes: Hazy density in the mediastinal adipose tissue as well as the subcutaneous adipose tissue possibly from third spacing of fluid. Right lower paratracheal node 1.1 cm in short axis on  image  42/7. Lungs/Pleura: Bandlike scarring or atelectasis in the left lower lobe at the site of prior airspace opacity, mildly improved although there is some continued anterior airspace opacity. Musculoskeletal: Small oblong sclerotic lesion in the left eighth rib lateral, not changed from 08/22/2018 common no cortical irregularity or cortical destruction. CT ABDOMEN PELVIS FINDINGS Hepatobiliary: Lateral segment left hepatic lobe cyst on image 88/7. Potential 4 mm hypodense lesion in the right hepatic lobe on image 102/7, technically nonspecific. Pancreas: Highly ill-defined pancreas. Indistinct stranding around the celiac trunk and superior mesenteric artery. Hypodensity favoring pancreatic body mass measuring about 2.4 by 4.1 cm on image 108/7. Dilated dorsal pancreatic duct. Spleen: Unremarkable. Adrenals/Urinary Tract: Small hypodense renal lesions are technically too small to characterize although statistically likely to be benign. Adrenal glands unremarkable. Stomach/Bowel: Prominently dilated stomach filled with food material. Poor definition of the duodenum. I can not exclude gastric outlet obstruction. Presumed vascular coils in the gastroduodenal artery. Poor definition of bowel wall due to the diffuse edema. Vascular/Lymphatic: Mild gastric varices. Infrarenal abdominal aortic aneurysm 3.1 cm in diameter. Aneurysmal bilateral common iliac arteries. Ectatic external iliac arteries. The portal vein and SMV remain patent. Varices are noted along the omentum and stomach. Reproductive: Unremarkable Other: Diffuse subcutaneous and mesenteric edema. Musculoskeletal: Spondylosis and degenerative disc disease at L5-S1. IMPRESSION: 1. Chronic appearing bilateral pulmonary embolus most notably involving the right lower lobe. 2. Mild enlargement of the pancreatic mass. No findings of metastatic disease to the liver currently. Small cyst in the lateral segment left hepatic lobe. 3. Notable dilatation of the stomach which  is filled with food. This raises the possibility of gastric outlet obstruction or narrowing along the descending duodenum. No bowel dilatation. 4. Third spacing of fluid with diffuse subcutaneous, mesenteric, and mediastinal edema. 5. Gastric and omental varices likely from portal venous hypertension. 6. Mild aneurysmal dilatation of the abdominal aorta at 3.1 cm with aneurysmal iliac arteries. Recommend followup by [place] in 3 years. This recommendation follows ACR consensus guidelines: White Paper of the ACR Incidental Findings Committee II on Vascular Findings. J Am Coll Radiol 2013; 10:789-794. 7.  Aortic Atherosclerosis (ICD10-I70.0). Radiology assistant personnel have been notified to put me in telephone contact with the referring physician or the referring physician's clinical representative in order to discuss these findings. Once this communication is established I will issue an addendum to this report for documentation purposes. Electronically Signed: By: Van Clines M.D. On: 10/27/2018 13:00       Assessment / Plan:     #37  58 year old African-American man with advanced pancreatic adenocarcinoma diagnosed December 2019.  He has had invasion of the duodenum and a very large deep cratered malignant ulcer which was complicated by massive GI bleeding December 2019.  Patient ultimately required embolization and coiling of the gastroduodenal artery control of bleeding.  He has completed a course of radiation, and is now undergoing restaging assessment prior to decisions regarding chemotherapy.  CTs of the abdomen pelvis and chest yesterday showed enlargement of the pancreatic neck mass, a dilated stomach full of food and fluid discerning for gastric outlet obstruction likely secondary to enlargement of the duodenal invasion. Is also developed gastric varices and omental varices, and diffuse subcutaneous and mesenteric edema  #2 Bilateral pulmonary emboli found on chest CT probably chronic-IV  heparin started last p.m.  Patient still at significantly increased risk of bleeding with known malignant ulcerated duodenal mass He will need to be watched very carefully on a heparin serial hemoglobins and discontinue  heparin immediately at any sign of melena or drop in hemoglobin.  #3 right lower paratracheal node   #4  Anemia-multifactorial  Plan; Patient is to be on clear liquids today, n.p.o. after midnight, in hopes that he will be able to empty out some of the contents of the stomach allow better visualization at EGD.  EGD will be scheduled with Dr. Loletha Carrow for Friday, 10/29/2018. Heparin will need to be stopped for 4 hours prior to procedure.  Patient will need to be observed very closely while on heparin is at significantly increased risk for bleeding due to known malignant duodenal ulceration. Follow serial hgb's                Principal Problem:   Popliteal artery embolus (HCC) Active Problems:   DM (diabetes mellitus), type 2, uncontrolled ([place])   Essential hypertension   Dilated cardiomyopathy (HCC)   Chest pain   AAA (abdominal aortic aneurysm) (HCC)   Ischemic foot   Right leg claudication (HCC)   PVD (peripheral vascular disease) ([place])   Hemorrhagic shock (HCC)   Nontraumatic ischemic infarction of muscle of right lower leg   Upper GI bleed   Cocaine abuse ([place])   Acute blood loss anemia   Chronic pain syndrome   Duodenal ulcer with hemorrhage   Pancreatic cancer ([place])   Goals of care, counseling/discussion   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 I personally interviewed and examined the patient.  CC: Gastric outlet obstruction  He is hungry and unhappy about his clear liquid diet.  He has no overt GI bleeding, hemoglobin 8.7.  Plan is for upper endoscopy tomorrow to assess the status of his malignant duodenal ulcer since he has advanced pancreatic cancer and pulmonary emboli.    Nelida Meuse III Office: [phone]

## 2018-10-28 NOTE — Progress Notes (Signed)
PROGRESS NOTE    Ryan Medina  KCL:[id number] DOB: Jul 24, 1961 DOA: 09/03/2018 PCP: Patient, No Pcp Per   Brief Narrative:  58 year old with history of peripheral vascular disease, dilated cardiomyopathy, essential hypertension, diabetes mellitus type 2 initially came to the hospital with right lower extremity pain and was found to have ischemic leg requiring embolectomy on 12/13.  He was placed on heparin drip subsequently developed right lower extremity hematoma requiring fasciotomy on 12/15.  Unfortunately afterwards he decompensated with active GI bleed and was intubated and was moved to the ICU.  He required embolization on 12/17.  During the course of his hospitalization he is required total of 26 units of blood.  Further evaluation showed pancreatic cancer with duodenal ulcer.  Transferred to [facility] for radiation treatment to help with bleeding.  Hospital course complicated by sepsis from healthcare acquired pneumonia and Pseudomonas UTI requiring treatment with vancomycin and cefepime.  Patient found to have chronic PE therefore started on heparin drip per oncology.   Assessment & Plan:   Principal Problem:   Popliteal artery embolus (HCC) Active Problems:   DM (diabetes mellitus), type 2, uncontrolled ([place])   Essential hypertension   Dilated cardiomyopathy (HCC)   Chest pain   AAA (abdominal aortic aneurysm) (HCC)   Ischemic foot   Right leg claudication (HCC)   PVD (peripheral vascular disease) ([place])   Hemorrhagic shock ([place])   Nontraumatic ischemic infarction of muscle of right lower leg   Upper GI bleed   Cocaine abuse ([place])   Acute blood loss anemia   Chronic pain syndrome   Duodenal ulcer with hemorrhage   Pancreatic cancer ([place])   Goals of care, counseling/discussion   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  Status post radiation.  Per oncology plans for outpatient radiation. -Status post endoscopy 12/17, GDA embolization 12/17, status post radiation. - Continue PPI. - Plans for endoscopy tomorrow, n.p.o. past midnight. Unfortunately patient has poor prognosis given his advanced malignancy.  His exact prognosis depending on his response to chemotherapy which is planned and how he tolerates it.  Chronic bilateral pulmonary embolism -Low-dose heparin started per oncology.  Will closely monitor for any signs of bleeding.  Sepsis secondary to H CAP and Pseudomonas urinary tract infection -Status post treatment with vancomycin and cefepime.  PICC line pulled out 1/26.  Ischemic right foot with right popliteal and tibial artery embolus -Status post embolectomy.  Complicated by compartment syndrome requiring fasciotomy. -Low-dose heparin started.  Closely monitor for any bleeding.  Cardiomyopathy, combined systolic and diastolic CHF, chronic -Patient appears to be euvolemic in nature.  Continue to provide supportive care.  Uncontrolled diabetes mellitus type 2 -Hemoglobin A1c 18.2.  Continue current regimen of Lantus, Accu-Chek and sliding scale  Severe protein calorie malnutrition - Protein supplement ordered  Generalized weakness-physical therapy/Occupational Therapy-recommending skilled nursing facility  DVT prophylaxis: SCDs Code Status: Okay for intubation, no CPR.  Partial code Family Communication: Significant other at bedside Disposition Plan: Maintain inpatient stay until endoscopy and thereafter will require placement.  Consultants:  Gastroenterology  Oncology  Interventional radiology  Critical care  Vascular surgery  Palliative care  Procedures:   Endoscopy 12/17  EUS 12/26  Chemo-Port placement January 21  Antimicrobials:   Completed course of vancomycin and cefepime   Subjective: No new complaints this morning.  Review of Systems Otherwise negative except as per HPI, including: General = no fevers, chills, dizziness, malaise, fatigue HEENT/EYES = negative for pain, redness, loss of vision, double vision, blurred vision, loss of hearing, sore throat, hoarseness, dysphagia Cardiovascular= negative for chest pain, palpitation, murmurs, lower extremity swelling Respiratory/lungs= negative for shortness of breath, cough, hemoptysis, wheezing, mucus production Gastrointestinal= negative for nausea, vomiting,, abdominal pain, melena, hematemesis Genitourinary= negative for Dysuria, Hematuria, Change in Urinary Frequency MSK = Negative for arthralgia, myalgias, Back Pain, Joint swelling  Neurology= Negative for headache, seizures, numbness, tingling  Psychiatry= Negative for anxiety, depression, suicidal and homocidal ideation Allergy/Immunology= Medication/Food allergy as listed  Skin= Negative for Rash, lesions, ulcers, itching   Objective: Vitals:   10/27/18 1340 10/27/18 1743 10/27/18 2051 10/28/18 0607  BP: (!) 134/99 (!) 128/91 125/86 (!) 129/98  Pulse: 95 94 93 94  Resp: 18 16 20 20   Temp: 99.4 F (37.4 C) 99.1 F (37.3 C) 99.2 F (37.3 C) 98.7 F (37.1 C)  TempSrc: Oral Oral Oral Oral  SpO2: 100% 98% 100% 98%  Weight:      Height:        Intake/Output Summary (Last 24 hours) at 10/28/2018 1252 Last data filed at 10/28/2018 1041 Gross per 24 hour  Intake 335.4 ml  Output 1325 ml  Net -989.6 ml   Filed Weights   09/28/18 1500 09/28/18 1914 10/11/18 1057  Weight: 59 kg 57.6 kg 54.7 kg    Examination: Constitutional: NAD, calm, comfortable; bilateral temporal wasting,  cachexia Eyes: PERRL, lids and conjunctivae normal ENMT: Mucous membranes are moist. Posterior pharynx clear of any exudate or lesions.Normal dentition.  Neck: normal, supple, no masses, no thyromegaly Respiratory: clear to auscultation bilaterally, no wheezing, no crackles. Normal respiratory effort. No accessory muscle use.  Cardiovascular: Regular rate and rhythm, no murmurs / rubs / gallops. No extremity edema. 2+ pedal pulses. No carotid bruits.  Abdomen: no tenderness, no masses palpated. No hepatosplenomegaly. Bowel sounds positive.  Musculoskeletal: no clubbing / cyanosis. No joint deformity upper and lower extremities. Good ROM, no contractures. Normal muscle tone.  Skin: Right lower extremity wound dressing noted Neurologic: CN 2-12 grossly intact. Sensation intact, DTR normal. Strength 4/5 in all 4.  Psychiatric: Normal judgment and insight. Alert and oriented x 3. Normal mood.  Follow-up   Data Reviewed:   CBC: Recent Labs  Lab 10/27/18 0830 10/28/18 0947  WBC 9.2 10.5  NEUTROABS 7.6  --   HGB 8.5* 8.7*  HCT 28.0* 27.9*  MCV 89.7 90.9  PLT 319 628   Basic Metabolic Panel: Recent Labs  Lab 10/27/18 0830  NA 135  K 4.1  CL 100  CO2 29  GLUCOSE 237*  BUN 9  CREATININE 0.39*  CALCIUM 7.7*   GFR: Estimated Creatinine Clearance: 78.8 mL/min (A) (by C-G formula based on SCr of 0.39 mg/dL (L)). Liver Function Tests: Recent Labs  Lab 10/27/18 0830  AST 18  ALT 17  ALKPHOS 84  BILITOT 0.3  PROT 6.4*  ALBUMIN 1.5*   No results for input(s): LIPASE, AMYLASE in the last 168 hours. No results for input(s): AMMONIA in the last 168 hours. Coagulation Profile: No results for input(s): INR, PROTIME in the last 168  hours. Cardiac Enzymes: No results for input(s): CKTOTAL, CKMB, CKMBINDEX, TROPONINI in the last 168 hours. BNP (last 3 results) No results for input(s): PROBNP in the last 8760 hours. HbA1C: No results for input(s): HGBA1C in the last 72  hours. CBG: Recent Labs  Lab 10/27/18 1144 10/27/18 1736 10/27/18 2046 10/28/18 0757 10/28/18 1142  GLUCAP 172* 118* 174* 191* 169*   Lipid Profile: No results for input(s): CHOL, HDL, LDLCALC, TRIG, CHOLHDL, LDLDIRECT in the last 72 hours. Thyroid Function Tests: No results for input(s): TSH, T4TOTAL, FREET4, T3FREE, THYROIDAB in the last 72 hours. Anemia Panel: No results for input(s): VITAMINB12, FOLATE, FERRITIN, TIBC, IRON, RETICCTPCT in the last 72 hours. Sepsis Labs: No results for input(s): PROCALCITON, LATICACIDVEN in the last 168 hours.  No results found for this or any previous visit (from the past 240 hour(s)).       Radiology Studies: Ct Chest W Contrast  Addendum Date: 10/27/2018   ADDENDUM REPORT: 10/27/2018 13:41 ADDENDUM: The original report was by Dr. Van Clines. The following addendum is by Dr. Van Clines: Critical Value/emergent results were called by telephone at the time of interpretation on 10/27/2018 at 1:40 pm to Dr. Gerlean Ren , who verbally acknowledged these results. Electronically Signed   By: Van Clines M.D.   On: 10/27/2018 13:41   Result Date: 10/27/2018 CLINICAL DATA:  Pancreatic adenocarcinoma with duodenal ulcer, chemotherapy and radiation therapy in progress. EXAM: CT CHEST, ABDOMEN, AND PELVIS WITH CONTRAST TECHNIQUE: Multidetector CT imaging of the chest, abdomen and pelvis was performed following the standard protocol during bolus administration of intravenous contrast. CONTRAST:  120mL OMNIPAQUE IOHEXOL 300 MG/ML  SOLN COMPARISON:  Multiple exams, including CT abdomen from 09/27/2018 FINDINGS: CT CHEST FINDINGS Cardiovascular: Today's exam was not performed for pulmonary arterial assessment, but there is mostly peripheral and segmental right lower lobe filling defect in the pulmonary artery compatible with pulmonary embolus, likely chronic. Similar peripheral segmental and subsegmental filling defects in the left upper lobe and  left lower lobe pulmonary arteries. Right Port-A-Cath tip: Cavoatrial junction. Atherosclerotic calcification of the aortic arch and branch vessels. Mediastinum/Nodes: Hazy density in the mediastinal adipose tissue as well as the subcutaneous adipose tissue possibly from third spacing of fluid. Right lower paratracheal node 1.1 cm in short axis on image 42/7. Lungs/Pleura: Bandlike scarring or atelectasis in the left lower lobe at the site of prior airspace opacity, mildly improved although there is some continued anterior airspace opacity. Musculoskeletal: Small oblong sclerotic lesion in the left eighth rib lateral, not changed from 08/22/2018 common no cortical irregularity or cortical destruction. CT ABDOMEN PELVIS FINDINGS Hepatobiliary: Lateral segment left hepatic lobe cyst on image 88/7. Potential 4 mm hypodense lesion in the right hepatic lobe on image 102/7, technically nonspecific. Pancreas: Highly ill-defined pancreas. Indistinct stranding around the celiac trunk and superior mesenteric artery. Hypodensity favoring pancreatic body mass measuring about 2.4 by 4.1 cm on image 108/7. Dilated dorsal pancreatic duct. Spleen: Unremarkable. Adrenals/Urinary Tract: Small hypodense renal lesions are technically too small to characterize although statistically likely to be benign. Adrenal glands unremarkable. Stomach/Bowel: Prominently dilated stomach filled with food material. Poor definition of the duodenum. I can not exclude gastric outlet obstruction. Presumed vascular coils in the gastroduodenal artery. Poor definition of bowel wall due to the diffuse edema. Vascular/Lymphatic: Mild gastric varices. Infrarenal abdominal aortic aneurysm 3.1 cm in diameter. Aneurysmal bilateral common iliac arteries. Ectatic external iliac arteries. The portal vein and SMV remain patent. Varices are noted along the omentum and stomach.  Reproductive: Unremarkable Other: Diffuse subcutaneous and mesenteric edema. Musculoskeletal:  Spondylosis and degenerative disc disease at L5-S1. IMPRESSION: 1. Chronic appearing bilateral pulmonary embolus most notably involving the right lower lobe. 2. Mild enlargement of the pancreatic mass. No findings of metastatic disease to the liver currently. Small cyst in the lateral segment left hepatic lobe. 3. Notable dilatation of the stomach which is filled with food. This raises the possibility of gastric outlet obstruction or narrowing along the descending duodenum. No bowel dilatation. 4. Third spacing of fluid with diffuse subcutaneous, mesenteric, and mediastinal edema. 5. Gastric and omental varices likely from portal venous hypertension. 6. Mild aneurysmal dilatation of the abdominal aorta at 3.1 cm with aneurysmal iliac arteries. Recommend followup by [place] in 3 years. This recommendation follows ACR consensus guidelines: White Paper of the ACR Incidental Findings Committee II on Vascular Findings. J Am Coll Radiol 2013; 10:789-794. 7.  Aortic Atherosclerosis (ICD10-I70.0). Radiology assistant personnel have been notified to put me in telephone contact with the referring physician or the referring physician's clinical representative in order to discuss these findings. Once this communication is established I will issue an addendum to this report for documentation purposes. Electronically Signed: By: Van Clines M.D. On: 10/27/2018 13:00   Ct Abdomen Pelvis W Contrast  Addendum Date: 10/27/2018   ADDENDUM REPORT: 10/27/2018 13:41 ADDENDUM: The original report was by Dr. Van Clines. The following addendum is by Dr. Van Clines: Critical Value/emergent results were called by telephone at the time of interpretation on 10/27/2018 at 1:40 pm to Dr. Gerlean Ren , who verbally acknowledged these results. Electronically Signed   By: Van Clines M.D.   On: 10/27/2018 13:41   Result Date: 10/27/2018 CLINICAL DATA:  Pancreatic adenocarcinoma with duodenal ulcer, chemotherapy and radiation  therapy in progress. EXAM: CT CHEST, ABDOMEN, AND PELVIS WITH CONTRAST TECHNIQUE: Multidetector CT imaging of the chest, abdomen and pelvis was performed following the standard protocol during bolus administration of intravenous contrast. CONTRAST:  131mL OMNIPAQUE IOHEXOL 300 MG/ML  SOLN COMPARISON:  Multiple exams, including CT abdomen from 09/27/2018 FINDINGS: CT CHEST FINDINGS Cardiovascular: Today's exam was not performed for pulmonary arterial assessment, but there is mostly peripheral and segmental right lower lobe filling defect in the pulmonary artery compatible with pulmonary embolus, likely chronic. Similar peripheral segmental and subsegmental filling defects in the left upper lobe and left lower lobe pulmonary arteries. Right Port-A-Cath tip: Cavoatrial junction. Atherosclerotic calcification of the aortic arch and branch vessels. Mediastinum/Nodes: Hazy density in the mediastinal adipose tissue as well as the subcutaneous adipose tissue possibly from third spacing of fluid. Right lower paratracheal node 1.1 cm in short axis on image 42/7. Lungs/Pleura: Bandlike scarring or atelectasis in the left lower lobe at the site of prior airspace opacity, mildly improved although there is some continued anterior airspace opacity. Musculoskeletal: Small oblong sclerotic lesion in the left eighth rib lateral, not changed from 08/22/2018 common no cortical irregularity or cortical destruction. CT ABDOMEN PELVIS FINDINGS Hepatobiliary: Lateral segment left hepatic lobe cyst on image 88/7. Potential 4 mm hypodense lesion in the right hepatic lobe on image 102/7, technically nonspecific. Pancreas: Highly ill-defined pancreas. Indistinct stranding around the celiac trunk and superior mesenteric artery. Hypodensity favoring pancreatic body mass measuring about 2.4 by 4.1 cm on image 108/7. Dilated dorsal pancreatic duct. Spleen: Unremarkable. Adrenals/Urinary Tract: Small hypodense renal lesions are technically too  small to characterize although statistically likely to be benign. Adrenal glands unremarkable. Stomach/Bowel: Prominently dilated stomach filled with food material. Poor definition of  the duodenum. I can not exclude gastric outlet obstruction. Presumed vascular coils in the gastroduodenal artery. Poor definition of bowel wall due to the diffuse edema. Vascular/Lymphatic: Mild gastric varices. Infrarenal abdominal aortic aneurysm 3.1 cm in diameter. Aneurysmal bilateral common iliac arteries. Ectatic external iliac arteries. The portal vein and SMV remain patent. Varices are noted along the omentum and stomach. Reproductive: Unremarkable Other: Diffuse subcutaneous and mesenteric edema. Musculoskeletal: Spondylosis and degenerative disc disease at L5-S1. IMPRESSION: 1. Chronic appearing bilateral pulmonary embolus most notably involving the right lower lobe. 2. Mild enlargement of the pancreatic mass. No findings of metastatic disease to the liver currently. Small cyst in the lateral segment left hepatic lobe. 3. Notable dilatation of the stomach which is filled with food. This raises the possibility of gastric outlet obstruction or narrowing along the descending duodenum. No bowel dilatation. 4. Third spacing of fluid with diffuse subcutaneous, mesenteric, and mediastinal edema. 5. Gastric and omental varices likely from portal venous hypertension. 6. Mild aneurysmal dilatation of the abdominal aorta at 3.1 cm with aneurysmal iliac arteries. Recommend followup by [place] in 3 years. This recommendation follows ACR consensus guidelines: White Paper of the ACR Incidental Findings Committee II on Vascular Findings. J Am Coll Radiol 2013; 10:789-794. 7.  Aortic Atherosclerosis (ICD10-I70.0). Radiology assistant personnel have been notified to put me in telephone contact with the referring physician or the referring physician's clinical representative in order to discuss these findings. Once this communication is established  I will issue an addendum to this report for documentation purposes. Electronically Signed: By: Van Clines M.D. On: 10/27/2018 13:00        Scheduled Meds: . atorvastatin  20 mg Oral QHS  . carvedilol  3.125 mg Oral BID WC  . dronabinol  5 mg Oral BID AC  . feeding supplement  1 Container Oral BID BM  . feeding supplement (ENSURE ENLIVE)  237 mL Oral BID BM  . gabapentin  400 mg Oral TID PC & HS  . insulin aspart  0-9 Units Subcutaneous TID AC & HS  . insulin detemir  4 Units Subcutaneous Daily  . morphine  15 mg Oral Q12H  . pantoprazole  40 mg Oral BID   Continuous Infusions: . heparin 1,450 Units/hr (10/28/18 1204)     LOS: 55 days   Time spent= 35 mins    Lunell Robart Arsenio Loader, MD [facility]  If 7PM-7AM, please contact night-coverage [email or web address] 10/28/2018, 12:52 PM

## 2018-10-28 NOTE — Progress Notes (Signed)
Nutrition Follow-up  DOCUMENTATION CODES:   Severe malnutrition in context of chronic illness, Underweight  INTERVENTION:   Once diet is advanced following EGD 2/7:  -Continue Boost Breeze po BID, each supplement provides 250 kcal and 9 grams of protein -Resume Ensure Enlive po BID, each supplement provides 350 kcal and 20 grams of protein -Recommend new weight measurement (last recorded 1/20)  NUTRITION DIAGNOSIS:   Severe Malnutrition related to chronic illness(Pancreatic cancer, DM and CHF) as evidenced by severe muscle depletion, severe fat depletion, percent weight loss.  Ongoing.  GOAL:   Patient will meet greater than or equal to 90% of their needs  Progressing.  MONITOR:   PO intake, Supplement acceptance, Weight trends, Labs  ASSESSMENT:   58 y.o male with PMH: recent dx pancreatic cancer, DM, CHF, crack cocaine use, emboli of legs, smoker. Pt admitted with admitted with DKA and GI bleed.   Significant events at [facility]: 12/13 embolectomy surgery for acute popliteal and tibial emboli 12/15 fasciotomy for RLE hematoma  12/17 Developed hemorrhagic shock secondary to bleeding duodenal ulcer requiring transfer to ICU for intubation 12/22 MRI showed pancreatic mass 12/26 Endoscopic ultrasound by Gi showed adenocarcinoma 1/5- Transferred back to ICU with hypotension anemia and GI bleed from invading pancreatic mass  [facility]: 1/7- Transferred to [facility] for radiation 1/22 -completed course of radiation 2/5: CT scan reveals enlargement of pancreatic mass, dilated stomach which is fluid and food filled  Patient currently on clear liquids, will NPO after midnight for EGD per GI. Per oncology note, pt may start chemotherapy next week. Pt was drinking supplements ordered consistently. Will resume s/p EGD once diet is advanced.  No new weight has been measured since 1/20.   Medications reviewed. Labs reviewed: CBGs: 169-191  Diet Order:   Diet Order            Diet NPO time  specified  Diet effective midnight        Diet clear liquid Room service appropriate? Yes; Fluid consistency: Thin  Diet effective now              EDUCATION NEEDS:   Not appropriate for education at this time  Skin:  Skin Assessment: Reviewed RN Assessment  Last BM:  2/5  Height:   Ht Readings from Last 1 Encounters:  09/28/18 5\' 11"  (1.803 m)    Weight:   Wt Readings from Last 1 Encounters:  10/11/18 54.7 kg    Ideal Body Weight:  78 kg  BMI:  Body mass index is 16.82 kg/m.  Estimated Nutritional Needs:   Kcal:  2000-2200 kcal  Protein:  100-110 grams  Fluid:  >/= 2L/day  Clayton Bibles, MS, RD, LDN [facility] Inpatient Clinical Dietitian Pager: [phone] After Hours Pager: [phone]

## 2018-10-28 NOTE — Progress Notes (Signed)
Dr Reesa Chew Paged with low cbg orders received D Mateo Flow RN

## 2018-10-28 NOTE — Progress Notes (Signed)
[place] for Heparin - no bolus Indication: PE  Allergies  Allergen Reactions  . Lisinopril Anaphylaxis    Patient Measurements: Height: 5\' 11"  (180.3 cm) Weight: 120 lb 9.6 oz (54.7 kg) IBW/kg (Calculated) : 75.3 Heparin Dosing Weight: 66.7  Vital Signs: Temp: 99.2 F (37.3 C) (02/05 2051) Temp Source: Oral (02/05 2051) BP: 125/86 (02/05 2051) Pulse Rate: 93 (02/05 2051)  Labs: Recent Labs    10/27/18 0830 10/28/18 0055  HGB 8.5*  --   HCT 28.0*  --   PLT 319  --   HEPARINUNFRC  --  0.13*  CREATININE 0.39*  --     Estimated Creatinine Clearance: 78.8 mL/min (A) (by C-G formula based on SCr of 0.39 mg/dL (L)).   Assessment: 58 yo male with arterial embolus s/p embolectomy. Patient had development of hematoma with compartment syndrome as well as hemorrhagic shock secondary to a bleeding duodenal ulcer.   Events this admission on 09/03/18:  LLE DVT/ischemia s/p urgent left popliteal artery embolectomy 12/13 >> RLE hematoma on 12/15 am s/p evacuation in OR.Heparin stopped 2/2 drop in hgb, hematemesis. -Heparin cautiously started 12/22,had few episodes of hematemesis & nose bleed 12/24> stopped heparin per dr. Candiss Norse S/p EUS 12/26:  pancreatic mass eroding into duodenum -VVS notes ikely too high risk for [facility]  12/28- Dr. Candiss Norse noted Continue PPI and Hold Heparin indefinitely  2/5: pharmacy consulted to dose heparin with no bolus by Dr Marin Olp for acute PE. Previously on 12/24 his heparin level was therapeutic at 0.35 on 1200 units/hr. Hg 8.5, PLTC 319.  Today, 2/6 0055 HL = 0.13 , below goal, no infusion or new bleeding issues per RN.   Goal of Therapy:  Heparin level 0.3-0.5 units/mL Monitor platelets by anticoagulation protocol: Yes     Plan: Increase heparin drip to 1300 units/hr  Check heparin level 8 hrs after start- will aim for low end of therapeutic goal Daily HL and CBC while on heparin  Dorrene German [phone] 10/28/2018 1:31 AM

## 2018-10-28 NOTE — Consult Note (Addendum)
[facility] Nurse wound consult note Reason for Consult: Consult requested for right foot.  Pt has been followed in the past by the vascular team and had angioplasty performed in Dec, according to the EMR for an ischemic foot; pt was noted to have decrease ABI at that time. Wound type: Right foot has recently developed clear fluid-filled blisters which have ruptured and peeled in some locations and evolved into partial thickness wounds.   Measurement: Anterior foot is dark red from behind toes to below ankle Intact blister 4X3cm.   Outer plantar foot is dark reddish-purple with intact skin; 5X2cm  Inner ankle with partial thickness wound 1X1X.1cm, moist and red, small amt tan drainage, no odor Dressing procedure/placement/frequency: Float heel to reduce pressure.  Xeroform gauze to promote healing.  Pt could benefit from consult by vascular service since the change in appearance of his foot has recently occurred; please order if desired.  Please re-consult if further assistance is needed.  Thank-you,  Julien Girt MSN, [place], [place], [place], [place]

## 2018-10-28 NOTE — Progress Notes (Signed)
[place] for Heparin - no bolus Indication: PE  Allergies  Allergen Reactions  . Lisinopril Anaphylaxis    Patient Measurements: Height: 5\' 11"  (180.3 cm) Weight: 120 lb 9.6 oz (54.7 kg) IBW/kg (Calculated) : 75.3 Heparin Dosing Weight: 66.7  Vital Signs: Temp: 98.7 F (37.1 C) (02/06 0607) Temp Source: Oral (02/06 0607) BP: 129/98 (02/06 0607) Pulse Rate: 94 (02/06 0607)  Labs: Recent Labs    10/27/18 0830 10/28/18 0055 10/28/18 0947  HGB 8.5*  --  8.7*  HCT 28.0*  --  27.9*  PLT 319  --  354  HEPARINUNFRC  --  0.13* <0.10*  CREATININE 0.39*  --   --     Estimated Creatinine Clearance: 78.8 mL/min (A) (by C-G formula based on SCr of 0.39 mg/dL (L)).   Assessment: 58 yo male with arterial embolus s/p embolectomy. Patient had development of hematoma with compartment syndrome as well as hemorrhagic shock secondary to a bleeding duodenal ulcer.   Events this admission on 09/03/18:  LLE DVT/ischemia s/p urgent left popliteal artery embolectomy 12/13 >> RLE hematoma on 12/15 am s/p evacuation in OR.Heparin stopped 2/2 drop in hgb, hematemesis. -Heparin cautiously started 12/22,had few episodes of hematemesis & nose bleed 12/24> stopped heparin per dr. Candiss Norse S/p EUS 12/26:  pancreatic mass eroding into duodenum -VVS notes ikely too high risk for [facility]  12/28- Dr. Candiss Norse noted Continue PPI and Hold Heparin indefinitely  2/5: pharmacy consulted to dose heparin with no bolus by Dr Marin Olp for acute PE. Per GI: Patient will need to be observed very closely while on heparin is at significantly increased risk for bleeding due to known malignant duodenal ulceration.   Today, 2/6 0055 HL = 0.13 , below goal, no infusion or new bleeding issues per RN.  5993 HL <0.1, below goal, no infusion or new bleeding per RN Hgb 8.7 HCT 27.9 Plts 354  Goal of Therapy:  Heparin level 0.3-0.5 units/mL Monitor platelets by anticoagulation protocol: Yes     Plan: Increase heparin drip to 1450 units/hr  Check heparin level 6 hrs - will aim for low end of therapeutic goal Stop heparin at 0700 on 2/7 for EGD Daily HL and CBC while on heparin  [place] 10/28/2018, 11:28 AM Pager [phone]

## 2018-10-28 NOTE — Progress Notes (Signed)
We did the CT scan yesterday on [name].  Surprisingly, it showed that he has pulmonary emboli.  He clearly is hypercoagulable.  I am sure this is being driven by his pancreatic cancer.  He is on heparin infusion now.  I would keep him on heparin for right now.  He apparently had contents in his stomach.  I again am not sure as to why he would have retained contents in his stomach.  He had mild enlargement of the pancreatic mass.  There is no obvious metastatic disease.  The radiologist commented on a nonspecific 4 mm lesion in the liver.  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  Again, he is on heparin right now.  He really needs to be on some kind of long-term anticoagulation.  GI has seen him.  I think they will do a upper endoscopy tomorrow on him.  He, overall, is about the same.  No labs have been done today.  He will need daily CBCs given that he is on heparin.  It would not surprise me if we have to transfuse him again.  I still would consider starting systemic chemotherapy on him.  I have to believe that he has micrometastatic disease that we cannot obviously see.  Again, he has nonspecific 4 mm lesion in the right lobe of the liver.  He has had no nausea or vomiting.  He has had no issues with bowels or bladder.  He has had no melena or bright red blood per rectum.  His vital signs all look stable.  Temperature 98.7.  Pulse 94.  Blood pressure 130/100.  His head neck exam shows no ocular or oral lesions.  Has no palpable cervical or supra clavicular lymph nodes.  Lungs are clear bilaterally.  Cardiac exam regular rate and rhythm with no murmurs, rubs or bruits.  Abdomen is soft.  There may be some slight distention.  There is no guarding or rebound tenderness.  No obvious fluid wave in the abdomen.  Extremities shows no clubbing, cyanosis or edema.  Neurological exam is nonfocal.  Ryan Medina is on IV heparin at this point.  We will have to keep him on this until he has his endoscopy.   Then, we might consider switching him to 1 of the oral anticoagulants.  His systemic chemotherapy might get started next week.  A lot will depend upon the results of the upper endoscopy.  I appreciate all the help that he is getting.  I know he is getting fantastic care from all the staff up on [name]  [name], MD  Colossians 3:23

## 2018-10-28 NOTE — Progress Notes (Signed)
Occupational Discharge Note Patient Details Name: Ryan Medina MRN: [id number] DOB: [birth year] Today's Date: 10/28/2018    History of present illness 58 y.o. male with PMH including but not limited to DM and HTN who presented to the hospital on 12/13 with a right ischemic leg secondary to acute popliteal and tibial embolus, underwent embolectomy on 12/13 and subsequently was placed on a heparin drip.  He unfortunately developed a acute right lower leg hematoma with compartment syndrome requiring fasciotomy on 12/15.  Further hospital course was complicated by development of hemorrhagic shock with acute blood loss anemia secondary to a bleeding duodenal ulcer-requiring ICU transfer-intubation for airway protection and IR evaluation with mesenteric angiogram and GDA embolization.  Upon stability he was transferred back to the [facility] hospitalist service on 12/19. Pt is now s/p closure of R LE fasciotomy on 09/10/18. Pt now also with new malignant duodenal ulcer and malignant pancreatic mass   OT comments  OT and pt spoke about role of OT.  Pt overall S- min A with ADL activity.  Girlfriend  A pt as needed.  No further acute OT needed at this time as she provides A with all ADL's. Pt is able to use walker and move around the room at mod I level.  Pt does not put weight on RLE.     Follow Up Recommendations  SNF    Equipment Recommendations  None recommended by OT    Recommendations for Other Services      Precautions / Restrictions Precautions Precautions: Fall Precaution Comments: pt is currently hopping on one leg due to R LE pain Other Brace: pt declines wearing CAM boot "It hurts and it's heavy" Restrictions Weight Bearing Restrictions: No RLE Weight Bearing: Weight bearing as tolerated       Mobility Bed Mobility Overal bed mobility: Modified Independent             General bed mobility comments: pt self able  Transfers Overall transfer level: Modified  independent Equipment used: Rolling walker (2 wheeled) Transfers: Sit to/from [employer] transfer comment: pt self able     Balance Overall balance assessment: Needs assistance   Sitting balance-Leahy Scale: Good     Standing balance support: Bilateral upper extremity supported Standing balance-Leahy Scale: Poor                             ADL either performed or assessed with clinical judgement   ADL Overall ADL's : Needs assistance/impaired                                       General ADL Comments: Pt overall S- min A with ADL activity. girlfriend A pt with all ADL activity.  Discussed role of OT with pt.  Goals are S which pt is able to perform but GF does A with ADL activity.  Do not feel further OT needed at thiss time.  Pt agrees.  He uses walker with GF to get back and forther to the bathroom and around in the room . OT wil sign off after discussing this with pt.                 Cognition Arousal/Alertness: Awake/alert Behavior During Therapy: WFL for tasks assessed/performed Overall Cognitive Status: Within Functional Limits for tasks  assessed                                 General Comments: AxO x 4                    Pertinent Vitals/ Pain       Pain Score: 3  Pain Location: R foot (dorsal) Pain Descriptors / Indicators: Discomfort;Heaviness Pain Intervention(s): Limited activity within patient's tolerance;Monitored during session         Frequency  Min 2X/week        Progress Toward Goals  OT Goals(current goals can now be found in the care plan section)  Progress towards OT goals: Goals met/education completed, patient discharged from [facility] Other (comment)(Will DC OT as pt overall S- min A with ADL activity and GF A as needed)       AM-PAC OT "6 Clicks" Daily Activity     Outcome Measure   Help from another person eating meals?: None Help from  another person taking care of personal grooming?: None Help from another person toileting, which includes using toliet, bedpan, or urinal?: None Help from another person bathing (including washing, rinsing, drying)?: None Help from another person to put on and taking off regular upper body clothing?: None Help from another person to put on and taking off regular lower body clothing?: None 6 Click Score: 24    End of Session Equipment Utilized During Treatment: Rolling walker      Activity Tolerance Patient tolerated treatment well   Patient Left with family/visitor present;in bed   Nurse Communication Mobility status        Time: 1353-1405 OT Time Calculation (min): 12 min  Charges: OT General Charges $OT Visit: 1 Visit OT Treatments $Self Care/Home Management : 8-22 mins  Kari Baars, OT Acute Rehabilitation Services [phone] Office- [phone]      Cort Dragoo, Edwena Felty D 10/28/2018, 3:19 PM

## 2018-10-28 NOTE — Progress Notes (Addendum)
Pharmacy: Re- heparin  Patient's a 58 y.o M with extensive PMH including adenocarcinoma of the pancreas, hemorrhagic shock secondary to duodenal ulcer bleeding, and ischemic right foot right popliteal and tibial artery embolus (s/p embolectomy).  Chest CTA on 2/5 showed chronic bilateral PE. Heparin drip started on 2/5 for PE.  Heparin level is sub-therapeutic at 0.27 (goal 0.3-0.5).  Plan: - Increase heparin drip to 1500 units/hr - check 6 hr heparin level - monitor for s/s bleeding - GI recommends to stop heparin drip 4 hrs prior to EGD procedure on 10/29/2018. Will heparin at 0700 on 2/7 for EGD  Dia Sitter, PharmD, BCPS 10/28/2018 6:33 PM

## 2018-10-29 ENCOUNTER — Inpatient Hospital Stay (HOSPITAL_COMMUNITY): Payer: Medicaid Other | Admitting: Certified Registered Nurse Anesthetist

## 2018-10-29 ENCOUNTER — Encounter (HOSPITAL_COMMUNITY): Payer: Self-pay

## 2018-10-29 ENCOUNTER — Encounter (HOSPITAL_COMMUNITY)
Admission: EM | Disposition: A | Discharge status: Skilled Nursing Facility | Payer: Self-pay | Source: Home / Self Care | Attending: Internal Medicine

## 2018-10-29 ENCOUNTER — Inpatient Hospital Stay (HOSPITAL_COMMUNITY): Payer: Medicaid Other

## 2018-10-29 ENCOUNTER — Ambulatory Visit: Payer: Self-pay

## 2018-10-29 HISTORY — PX: ESOPHAGOGASTRODUODENOSCOPY (EGD) WITH PROPOFOL: SHX5813

## 2018-10-29 LAB — CBC WITH DIFFERENTIAL/PLATELET
Abs Immature Granulocytes: 0.05 10*3/uL (ref 0.00–0.07)
Basophils Absolute: 0 10*3/uL (ref 0.0–0.1)
Basophils Relative: 0 %
Eosinophils Absolute: 0 10*3/uL (ref 0.0–0.5)
Eosinophils Relative: 0 %
HCT: 27.5 % — ABNORMAL LOW (ref 39.0–52.0)
Hemoglobin: 8.4 g/dL — ABNORMAL LOW (ref 13.0–17.0)
Immature Granulocytes: 1 %
Lymphocytes Relative: 9 %
Lymphs Abs: 0.9 10*3/uL (ref 0.7–4.0)
MCH: 26.9 pg (ref 26.0–34.0)
MCHC: 30.5 g/dL (ref 30.0–36.0)
MCV: 88.1 fL (ref 80.0–100.0)
Monocytes Absolute: 0.7 10*3/uL (ref 0.1–1.0)
Monocytes Relative: 7 %
Neutro Abs: 7.9 10*3/uL — ABNORMAL HIGH (ref 1.7–7.7)
Neutrophils Relative %: 83 %
Platelets: 341 10*3/uL (ref 150–400)
RBC: 3.12 MIL/uL — ABNORMAL LOW (ref 4.22–5.81)
RDW: 14.1 % (ref 11.5–15.5)
WBC: 9.5 10*3/uL (ref 4.0–10.5)
nRBC: 0 % (ref 0.0–0.2)

## 2018-10-29 LAB — GLUCOSE, CAPILLARY
Glucose-Capillary: 179 mg/dL — ABNORMAL HIGH (ref 70–99)
Glucose-Capillary: 128 mg/dL — ABNORMAL HIGH (ref 70–99)
Glucose-Capillary: 37 mg/dL — CL (ref 70–99)
Glucose-Capillary: 127 mg/dL — ABNORMAL HIGH (ref 70–99)
Glucose-Capillary: 95 mg/dL (ref 70–99)
Glucose-Capillary: 101 mg/dL — ABNORMAL HIGH (ref 70–99)

## 2018-10-29 LAB — COMPREHENSIVE METABOLIC PANEL
ALT: 14 U/L (ref 0–44)
AST: 13 U/L — ABNORMAL LOW (ref 15–41)
Albumin: 1.4 g/dL — ABNORMAL LOW (ref 3.5–5.0)
Alkaline Phosphatase: 69 U/L (ref 38–126)
Anion gap: 7 (ref 5–15)
BUN: 6 mg/dL (ref 6–20)
CO2: 28 mmol/L (ref 22–32)
Calcium: 7.6 mg/dL — ABNORMAL LOW (ref 8.9–10.3)
Chloride: 99 mmol/L (ref 98–111)
Creatinine, Ser: 0.37 mg/dL — ABNORMAL LOW (ref 0.61–1.24)
GFR calc Af Amer: >60 mL/min (ref >60)
GFR calc non Af Amer: >60 mL/min (ref >60)
GLUCOSE: 184 mg/dL — AB (ref 70–99)
Potassium: 3.7 mmol/L (ref 3.5–5.1)
Sodium: 134 mmol/L — ABNORMAL LOW (ref 135–145)
Total Bilirubin: 0.4 mg/dL (ref 0.3–1.2)
Total Protein: 6 g/dL — ABNORMAL LOW (ref 6.5–8.1)

## 2018-10-29 LAB — HEPARIN LEVEL (UNFRACTIONATED): Heparin Unfractionated: 0.38 IU/mL (ref 0.30–0.70)

## 2018-10-29 SURGERY — ESOPHAGOGASTRODUODENOSCOPY (EGD) WITH PROPOFOL
Anesthesia: Monitor Anesthesia Care

## 2018-10-29 MED ORDER — HEPARIN (PORCINE) 25000 UT/250ML-% IV SOLN
1600.0000 [IU]/h | INTRAVENOUS | Status: DC
  Administered 2018-10-29: 1500 [IU]/h via INTRAVENOUS
  Administered 2018-10-30 (×2): 1800 [IU]/h via INTRAVENOUS
  Administered 2018-10-31 – 2018-11-04 (×5): 1600 [IU]/h via INTRAVENOUS
  Filled 2018-10-29 (×10): qty 250

## 2018-10-29 MED ORDER — LACTATED RINGERS IV SOLN
INTRAVENOUS | Status: DC
  Administered 2018-10-29: 13:00:00 via INTRAVENOUS

## 2018-10-29 MED ORDER — FLUCONAZOLE 100MG IVPB
100.0000 mg | INTRAVENOUS | Status: DC
  Administered 2018-10-29 – 2018-10-31 (×3): 100 mg via INTRAVENOUS
  Filled 2018-10-29 (×4): qty 50

## 2018-10-29 MED ORDER — PROPOFOL 10 MG/ML IV BOLUS
INTRAVENOUS | Status: AC
  Filled 2018-10-29: qty 60

## 2018-10-29 MED ORDER — MORPHINE SULFATE (PF) 2 MG/ML IV SOLN
2.0000 mg | INTRAVENOUS | Status: AC | PRN
  Administered 2018-10-30 – 2018-11-03 (×3): 2 mg via INTRAVENOUS
  Filled 2018-10-29 (×3): qty 1

## 2018-10-29 MED ORDER — DEXTROSE 50 % IV SOLN
25.0000 g | INTRAVENOUS | Status: AC
  Administered 2018-10-29: 25 g via INTRAVENOUS
  Filled 2018-10-29: qty 50

## 2018-10-29 MED ORDER — INSULIN ASPART 100 UNIT/ML ~~LOC~~ SOLN
0.0000 [IU] | SUBCUTANEOUS | Status: DC
  Administered 2018-10-30 (×4): 1 [IU] via SUBCUTANEOUS
  Administered 2018-10-30 – 2018-10-31 (×3): 2 [IU] via SUBCUTANEOUS
  Administered 2018-10-31: 3 [IU] via SUBCUTANEOUS
  Administered 2018-11-01 (×3): 2 [IU] via SUBCUTANEOUS
  Administered 2018-11-01: 1 [IU] via SUBCUTANEOUS
  Administered 2018-11-02 (×2): 2 [IU] via SUBCUTANEOUS
  Administered 2018-11-02 – 2018-11-03 (×5): 1 [IU] via SUBCUTANEOUS
  Administered 2018-11-03 (×2): 2 [IU] via SUBCUTANEOUS
  Administered 2018-11-04 (×4): 1 [IU] via SUBCUTANEOUS
  Administered 2018-11-05: 3 [IU] via SUBCUTANEOUS
  Administered 2018-11-05: 2 [IU] via SUBCUTANEOUS
  Administered 2018-11-05 – 2018-11-06 (×3): 1 [IU] via SUBCUTANEOUS
  Administered 2018-11-06: 3 [IU] via SUBCUTANEOUS

## 2018-10-29 MED ORDER — PROPOFOL 10 MG/ML IV BOLUS
INTRAVENOUS | Status: DC | PRN
  Administered 2018-10-29: 50 mg via INTRAVENOUS

## 2018-10-29 MED ORDER — LIDOCAINE HCL (CARDIAC) PF 100 MG/5ML IV SOSY
PREFILLED_SYRINGE | INTRAVENOUS | Status: DC | PRN
  Administered 2018-10-29: 100 mg via INTRAVENOUS

## 2018-10-29 MED ORDER — LACTATED RINGERS IV SOLN
INTRAVENOUS | Status: DC | PRN
  Administered 2018-10-29: 13:00:00 via INTRAVENOUS

## 2018-10-29 MED ORDER — PROPOFOL 500 MG/50ML IV EMUL
INTRAVENOUS | Status: DC | PRN
  Administered 2018-10-29: 150 ug/kg/min via INTRAVENOUS

## 2018-10-29 MED ORDER — PANTOPRAZOLE SODIUM 40 MG IV SOLR
40.0000 mg | Freq: Once | INTRAVENOUS | Status: AC
  Administered 2018-10-29: 40 mg via INTRAVENOUS
  Filled 2018-10-29: qty 40

## 2018-10-29 SURGICAL SUPPLY — 15 items

## 2018-10-29 NOTE — H&P (View-Only) (Signed)
Patient ID: Ryan Medina, male   DOB: 1960/10/27, 58 y.o.   MRN: [id number]     Progress Note   Subjective    Upset this am about not being able to eat . Denies abdominal pain , no N/V  Heparin off at 8 am   hgb 8.4 this am   Objective   Vital signs in last 24 hours: Temp:  [98 F (36.7 C)-98.6 F (37 C)] 98.3 F (36.8 C) (02/07 0558) Pulse Rate:  [81-86] 81 (02/07 0558) Resp:  [16-18] 17 (02/07 0558) BP: (118-125)/(91-99) 121/91 (02/07 0558) SpO2:  [100 %] 100 % (02/07 0558) Last BM Date: 10/28/18 General:    AA male  in NAD, cachectic appearing Heart:  Regular rate and rhythm; no murmurs Lungs: Respirations even and unlabored, lungs CTA bilaterally Abdomen:  Soft, much less full  In upper abdomen , BS present  Extremities:  Without edema. Neurologic:  Alert and oriented,  grossly normal neurologically. Psych:  Cooperative. Normal mood and affect.  Intake/Output from previous day: 02/06 0701 - 02/07 0700 In: 2188.1 [P.O.:960; I.V.:1228.1] Out: 2525 [Urine:2525] Intake/Output this shift: Total I/O In: -  Out: 200 [Urine:200]  Lab Results: Recent Labs    10/27/18 0830 10/28/18 0947 10/29/18 0608  WBC 9.2 10.5 9.5  HGB 8.5* 8.7* 8.4*  HCT 28.0* 27.9* 27.5*  PLT 319 354 341   BMET Recent Labs    10/27/18 0830 10/29/18 0608  NA 135 134*  K 4.1 3.7  CL 100 99  CO2 29 28  GLUCOSE 237* 184*  BUN 9 6  CREATININE 0.39* 0.37*  CALCIUM 7.7* 7.6*   LFT Recent Labs    10/29/18 0608  PROT 6.0*  ALBUMIN 1.4*  AST 13*  ALT 14  ALKPHOS 69  BILITOT 0.4   PT/INR No results for input(s): LABPROT, INR in the last 72 hours.  Studies/Results: Ct Chest W Contrast  Addendum Date: 10/27/2018   ADDENDUM REPORT: 10/27/2018 13:41 ADDENDUM: The original report was by Dr. Van Clines. The following addendum is by Dr. Van Clines: Critical Value/emergent results were called by telephone at the time of interpretation on 10/27/2018 at 1:40 pm to Dr. Gerlean Ren , who verbally acknowledged these results. Electronically Signed   By: Van Clines M.D.   On: 10/27/2018 13:41   Result Date: 10/27/2018 CLINICAL DATA:  Pancreatic adenocarcinoma with duodenal ulcer, chemotherapy and radiation therapy in progress. EXAM: CT CHEST, ABDOMEN, AND PELVIS WITH CONTRAST TECHNIQUE: Multidetector CT imaging of the chest, abdomen and pelvis was performed following the standard protocol during bolus administration of intravenous contrast. CONTRAST:  192mL OMNIPAQUE IOHEXOL 300 MG/ML  SOLN COMPARISON:  Multiple exams, including CT abdomen from 09/27/2018 FINDINGS: CT CHEST FINDINGS Cardiovascular: Today's exam was not performed for pulmonary arterial assessment, but there is mostly peripheral and segmental right lower lobe filling defect in the pulmonary artery compatible with pulmonary embolus, likely chronic. Similar peripheral segmental and subsegmental filling defects in the left upper lobe and left lower lobe pulmonary arteries. Right Port-A-Cath tip: Cavoatrial junction. Atherosclerotic calcification of the aortic arch and branch vessels. Mediastinum/Nodes: Hazy density in the mediastinal adipose tissue as well as the subcutaneous adipose tissue possibly from third spacing of fluid. Right lower paratracheal node 1.1 cm in short axis on image 42/7. Lungs/Pleura: Bandlike scarring or atelectasis in the left lower lobe at the site of prior airspace opacity, mildly improved although there is some continued anterior airspace opacity. Musculoskeletal: Small oblong sclerotic lesion in the left eighth  rib lateral, not changed from 08/22/2018 common no cortical irregularity or cortical destruction. CT ABDOMEN PELVIS FINDINGS Hepatobiliary: Lateral segment left hepatic lobe cyst on image 88/7. Potential 4 mm hypodense lesion in the right hepatic lobe on image 102/7, technically nonspecific. Pancreas: Highly ill-defined pancreas. Indistinct stranding around the celiac trunk and superior  mesenteric artery. Hypodensity favoring pancreatic body mass measuring about 2.4 by 4.1 cm on image 108/7. Dilated dorsal pancreatic duct. Spleen: Unremarkable. Adrenals/Urinary Tract: Small hypodense renal lesions are technically too small to characterize although statistically likely to be benign. Adrenal glands unremarkable. Stomach/Bowel: Prominently dilated stomach filled with food material. Poor definition of the duodenum. I can not exclude gastric outlet obstruction. Presumed vascular coils in the gastroduodenal artery. Poor definition of bowel wall due to the diffuse edema. Vascular/Lymphatic: Mild gastric varices. Infrarenal abdominal aortic aneurysm 3.1 cm in diameter. Aneurysmal bilateral common iliac arteries. Ectatic external iliac arteries. The portal vein and SMV remain patent. Varices are noted along the omentum and stomach. Reproductive: Unremarkable Other: Diffuse subcutaneous and mesenteric edema. Musculoskeletal: Spondylosis and degenerative disc disease at L5-S1. IMPRESSION: 1. Chronic appearing bilateral pulmonary embolus most notably involving the right lower lobe. 2. Mild enlargement of the pancreatic mass. No findings of metastatic disease to the liver currently. Small cyst in the lateral segment left hepatic lobe. 3. Notable dilatation of the stomach which is filled with food. This raises the possibility of gastric outlet obstruction or narrowing along the descending duodenum. No bowel dilatation. 4. Third spacing of fluid with diffuse subcutaneous, mesenteric, and mediastinal edema. 5. Gastric and omental varices likely from portal venous hypertension. 6. Mild aneurysmal dilatation of the abdominal aorta at 3.1 cm with aneurysmal iliac arteries. Recommend followup by [place] in 3 years. This recommendation follows ACR consensus guidelines: White Paper of the ACR Incidental Findings Committee II on Vascular Findings. J Am Coll Radiol 2013; 10:789-794. 7.  Aortic Atherosclerosis (ICD10-I70.0).  Radiology assistant personnel have been notified to put me in telephone contact with the referring physician or the referring physician's clinical representative in order to discuss these findings. Once this communication is established I will issue an addendum to this report for documentation purposes. Electronically Signed: By: Van Clines M.D. On: 10/27/2018 13:00   Ct Abdomen Pelvis W Contrast  Addendum Date: 10/27/2018   ADDENDUM REPORT: 10/27/2018 13:41 ADDENDUM: The original report was by Dr. Van Clines. The following addendum is by Dr. Van Clines: Critical Value/emergent results were called by telephone at the time of interpretation on 10/27/2018 at 1:40 pm to Dr. Gerlean Ren , who verbally acknowledged these results. Electronically Signed   By: Van Clines M.D.   On: 10/27/2018 13:41   Result Date: 10/27/2018 CLINICAL DATA:  Pancreatic adenocarcinoma with duodenal ulcer, chemotherapy and radiation therapy in progress. EXAM: CT CHEST, ABDOMEN, AND PELVIS WITH CONTRAST TECHNIQUE: Multidetector CT imaging of the chest, abdomen and pelvis was performed following the standard protocol during bolus administration of intravenous contrast. CONTRAST:  150mL OMNIPAQUE IOHEXOL 300 MG/ML  SOLN COMPARISON:  Multiple exams, including CT abdomen from 09/27/2018 FINDINGS: CT CHEST FINDINGS Cardiovascular: Today's exam was not performed for pulmonary arterial assessment, but there is mostly peripheral and segmental right lower lobe filling defect in the pulmonary artery compatible with pulmonary embolus, likely chronic. Similar peripheral segmental and subsegmental filling defects in the left upper lobe and left lower lobe pulmonary arteries. Right Port-A-Cath tip: Cavoatrial junction. Atherosclerotic calcification of the aortic arch and branch vessels. Mediastinum/Nodes: Hazy density in the mediastinal adipose tissue  as well as the subcutaneous adipose tissue possibly from third spacing of  fluid. Right lower paratracheal node 1.1 cm in short axis on image 42/7. Lungs/Pleura: Bandlike scarring or atelectasis in the left lower lobe at the site of prior airspace opacity, mildly improved although there is some continued anterior airspace opacity. Musculoskeletal: Small oblong sclerotic lesion in the left eighth rib lateral, not changed from 08/22/2018 common no cortical irregularity or cortical destruction. CT ABDOMEN PELVIS FINDINGS Hepatobiliary: Lateral segment left hepatic lobe cyst on image 88/7. Potential 4 mm hypodense lesion in the right hepatic lobe on image 102/7, technically nonspecific. Pancreas: Highly ill-defined pancreas. Indistinct stranding around the celiac trunk and superior mesenteric artery. Hypodensity favoring pancreatic body mass measuring about 2.4 by 4.1 cm on image 108/7. Dilated dorsal pancreatic duct. Spleen: Unremarkable. Adrenals/Urinary Tract: Small hypodense renal lesions are technically too small to characterize although statistically likely to be benign. Adrenal glands unremarkable. Stomach/Bowel: Prominently dilated stomach filled with food material. Poor definition of the duodenum. I can not exclude gastric outlet obstruction. Presumed vascular coils in the gastroduodenal artery. Poor definition of bowel wall due to the diffuse edema. Vascular/Lymphatic: Mild gastric varices. Infrarenal abdominal aortic aneurysm 3.1 cm in diameter. Aneurysmal bilateral common iliac arteries. Ectatic external iliac arteries. The portal vein and SMV remain patent. Varices are noted along the omentum and stomach. Reproductive: Unremarkable Other: Diffuse subcutaneous and mesenteric edema. Musculoskeletal: Spondylosis and degenerative disc disease at L5-S1. IMPRESSION: 1. Chronic appearing bilateral pulmonary embolus most notably involving the right lower lobe. 2. Mild enlargement of the pancreatic mass. No findings of metastatic disease to the liver currently. Small cyst in the lateral  segment left hepatic lobe. 3. Notable dilatation of the stomach which is filled with food. This raises the possibility of gastric outlet obstruction or narrowing along the descending duodenum. No bowel dilatation. 4. Third spacing of fluid with diffuse subcutaneous, mesenteric, and mediastinal edema. 5. Gastric and omental varices likely from portal venous hypertension. 6. Mild aneurysmal dilatation of the abdominal aorta at 3.1 cm with aneurysmal iliac arteries. Recommend followup by [place] in 3 years. This recommendation follows ACR consensus guidelines: White Paper of the ACR Incidental Findings Committee II on Vascular Findings. J Am Coll Radiol 2013; 10:789-794. 7.  Aortic Atherosclerosis (ICD10-I70.0). Radiology assistant personnel have been notified to put me in telephone contact with the referring physician or the referring physician's clinical representative in order to discuss these findings. Once this communication is established I will issue an addendum to this report for documentation purposes. Electronically Signed: By: Van Clines M.D. On: 10/27/2018 13:00       Assessment / Plan:     #1 58 yo AA male with advanced pancreatic adenocarcinoma diagnosed December 2019.  He has had invasion of the duodenum, and had a massive GI bleed from a large deep cratered malignant ulcer in December 2019.  He ultimately required embolization and coiling of the gastroduodenal artery for control of bleeding.  He has now completed a course of radiation while inpatient.  Repeat CTs on 10/27/2018, unfortunately have shown enlargement of the pancreatic neck mass, and a dilated stomach full of food and fluid concerning for gastric outlet obstruction is likely secondary to large amount of the duodenal infiltration/ulcerated mass.   #2 lateral pulmonary emboli found on chest CT 10/27/2018.  Patient had not had any acute symptoms, these are felt to be subacute.  IV heparin has been initiated  Patient is still  felt to be at significantly increased risk  of hemorrhage from malignant duodenal ulcerated mass.  Will continue to need observation and serial hemoglobins  #3 new right lower paratracheal node  #4 anemia-  Plan; Patient is scheduled for upper endoscopy this afternoon with Dr. Loletha Carrow, hopefully has stomach has emptied enough to allow for good visualization.  Heparin off for now. The recommendations pending endings at EGD.           Principal Problem:   Popliteal artery embolus (HCC) Active Problems:   DM (diabetes mellitus), type 2, uncontrolled ([place])   Essential hypertension   Dilated cardiomyopathy (HCC)   Chest pain   AAA (abdominal aortic aneurysm) (HCC)   Ischemic foot   Right leg claudication (HCC)   PVD (peripheral vascular disease) ([place])   Hemorrhagic shock ([place])   Nontraumatic ischemic infarction of muscle of right lower leg   Upper GI bleed   Cocaine abuse ([place])   Acute blood loss anemia   Chronic pain syndrome   Duodenal ulcer with hemorrhage   Pancreatic cancer ([place])   Goals of care, counseling/discussion   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

## 2018-10-29 NOTE — Progress Notes (Signed)
PT Cancellation Note  Patient Details Name: Ryan Medina MRN: [id number] DOB: 28-May-1961   Cancelled Treatment:     EGD today   Rica Koyanagi  PTA Acute  Rehabilitation Services Pager      [phone] Office      [phone]

## 2018-10-29 NOTE — Progress Notes (Signed)
Patient arrived from EGD as NPO. Patient is noncompliant. Eating snacks from home at bedside. Myself and the manager of the unit educated him on the importance of remaining NPO. Patient initially refusing NG tube placement due to the inability to eat while in place. Patient is agreeing to have NG tube placed now and states understanding of remaining NPO. Will continue to monitor.

## 2018-10-29 NOTE — Progress Notes (Signed)
[place] for Heparin - no bolus Indication: PE  Allergies  Allergen Reactions  . Lisinopril Anaphylaxis    Patient Measurements: Height: 5\' 11"  (180.3 cm) Weight: 120 lb 9.6 oz (54.7 kg) IBW/kg (Calculated) : 75.3 Heparin Dosing Weight: 66.7  Vital Signs: Temp: 98.2 F (36.8 C) (02/07 1530) Temp Source: Oral (02/07 1530) BP: 123/97 (02/07 1530) Pulse Rate: 84 (02/07 1530)  Labs: Recent Labs    10/27/18 0830  10/28/18 0947 10/28/18 1803 10/29/18 0038 10/29/18 0608  HGB 8.5*  --  8.7*  --   --  8.4*  HCT 28.0*  --  27.9*  --   --  27.5*  PLT 319  --  354  --   --  341  HEPARINUNFRC  --    < > <0.10* 0.27* 0.38  --   CREATININE 0.39*  --   --   --   --  0.37*   < > = values in this interval not displayed.    Estimated Creatinine Clearance: 78.8 mL/min (A) (by C-G formula based on SCr of 0.37 mg/dL (L)).   Assessment: 58 yo male with arterial embolus s/p embolectomy. Patient had development of hematoma with compartment syndrome as well as hemorrhagic shock secondary to a bleeding duodenal ulcer.   Events this admission on 09/03/18:  LLE DVT/ischemia s/p urgent left popliteal artery embolectomy 12/13 >> RLE hematoma on 12/15 am s/p evacuation in OR.Heparin stopped 2/2 drop in hgb, hematemesis. -Heparin cautiously started 12/22,had few episodes of hematemesis & nose bleed 12/24> stopped heparin per dr. Candiss Norse S/p EUS 12/26:  pancreatic mass eroding into duodenum -[facility] notes ikely too high risk for [facility]  12/28- Dr. Candiss Norse noted Continue PPI and Hold Heparin indefinitely  2/5: pharmacy consulted to dose heparin with no bolus by Dr Marin Olp for acute PE. Previously on 12/24 his heparin level was therapeutic at 0.35 on 1200 units/hr. Hg 8.5, PLTC 319.   10/29/2018 Heparin off at 09 am for EDG, heparin to resume after NG placed per GI instructions. KUB 1629> NG in stomach.   Goal of Therapy:  Heparin level 0.3-0.5 units/mL Monitor  platelets by anticoagulation protocol: Yes     Plan: Resume heparin at previous therapeutic rate of 1500 units/hr now that NG placed.  Daily HL and CBC while on heparin  Eudelia Bunch, Pharm.D [phone] 10/29/2018 4:39 PM

## 2018-10-29 NOTE — Anesthesia Preprocedure Evaluation (Signed)
Anesthesia Evaluation  Patient identified by MRN, date of birth, ID band Patient awake    Reviewed: Allergy & Precautions, NPO status , Patient's Chart, lab work & pertinent test results  History of Anesthesia Complications Negative for: history of anesthetic complications  Airway Mallampati: II  TM Distance: >3 FB Neck ROM: Full    Dental  (+) Dental Advisory Given, Teeth Intact   Pulmonary Current Smoker,    breath sounds clear to auscultation       Cardiovascular hypertension, Pt. on medications + Peripheral Vascular Disease and +CHF  Normal cardiovascular exam Rhythm:Regular Rate:Normal   '19 TTE - Mild LVH. EF 45% to 50%. Grade 1 diastolic dysfunction. Trivial AI. Mild MR and TR.     Neuro/Psych negative neurological ROS  negative psych ROS   GI/Hepatic GERD  Controlled,(+)     substance abuse  cocaine use,   Endo/Other  diabetes, Insulin Dependent Hyponatremia Hypocalcemia  Renal/GU Renal InsufficiencyRenal disease  negative genitourinary   Musculoskeletal negative musculoskeletal ROS (+)  Chronic back pain    Abdominal Normal abdominal exam  (+)   Peds  Hematology  (+) Blood dyscrasia, anemia ,   Anesthesia Other Findings   Reproductive/Obstetrics                             Anesthesia Physical  Anesthesia Plan  ASA: III  Anesthesia Plan: MAC   Post-op Pain Management:    Induction: Intravenous  PONV Risk Score and Plan: 2 and Treatment may vary due to age or medical condition  Airway Management Planned: Natural Airway and Mask  Additional Equipment: None  Intra-op Plan:   Post-operative Plan:   Informed Consent: I have reviewed the patients History and Physical, chart, labs and discussed the procedure including the risks, benefits and alternatives for the proposed anesthesia with the patient or authorized representative who has indicated his/her  understanding and acceptance.     Dental advisory given  Plan Discussed with: CRNA  Anesthesia Plan Comments:         Anesthesia Quick Evaluation

## 2018-10-29 NOTE — Progress Notes (Signed)
PROGRESS NOTE    Ryan Medina  PNT:[id number] DOB: 09-30-1960 DOA: 09/03/2018 PCP: Patient, No Pcp Per   Brief Narrative:   58 year old with history of peripheral vascular disease, dilated cardiomyopathy, essential hypertension, diabetes mellitus type 2 initially came to the hospital with right lower extremity pain and was found to have ischemic leg requiring embolectomy on 12/13.  He was placed on heparin drip subsequently developed right lower extremity hematoma requiring fasciotomy on 12/15.  Unfortunately afterwards he decompensated with active GI bleed and was intubated and was moved to the ICU.  He required embolization on 12/17.  During the course of his hospitalization he is required total of 26 units of blood.  Further evaluation showed pancreatic cancer with duodenal ulcer.  Transferred to [facility] for radiation treatment to help with bleeding.  Hospital course complicated by sepsis from healthcare acquired pneumonia and Pseudomonas UTI requiring treatment with vancomycin and cefepime.  Patient found to have chronic PE therefore started on heparin drip per oncology.  Assessment & Plan:   Principal Problem:   Popliteal artery embolus (HCC) Active Problems:   DM (diabetes mellitus), type 2, uncontrolled ([place])   Essential hypertension   Dilated cardiomyopathy (HCC)   Chest pain   AAA (abdominal aortic aneurysm) (HCC)   Ischemic foot   Right leg claudication (HCC)   PVD (peripheral vascular disease) ([place])   Hemorrhagic shock ([place])   Nontraumatic ischemic infarction of muscle of right lower leg   Upper GI bleed   Cocaine abuse ([place])   Acute blood loss anemia   Chronic pain syndrome   Duodenal ulcer with hemorrhage   Pancreatic cancer ([place])   Goals of care, counseling/discussion   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  Status post radiation.  Per oncology plans for outpatient radiation. -Status post endoscopy 12/17, GDA embolization 12/17, status post radiation. - Continue PPI. - Plans for endoscopy today for evaluation of his duodenal ulcer.  Chronic bilateral pulmonary embolism -No obvious signs of bleeding, low-dose heparin per oncology.  Will transition to oral when appropriate.  Sepsis secondary to H CAP and Pseudomonas urinary tract infection -Status post treatment with vancomycin and cefepime.  PICC line pulled out 1/26.  Ischemic right foot with right popliteal and tibial artery embolus -Status post embolectomy.  Complicated by compartment syndrome requiring fasciotomy. -Low-dose heparin started.  Closely monitor for any bleeding. -Lower extremity routine dressing, follow-up outpatient with vascular  Cardiomyopathy, combined systolic and diastolic CHF, chronic -Patient appears to be euvolemic in nature.  Continue to provide supportive care.  Uncontrolled diabetes mellitus type 2 -Hemoglobin A1c 18.2.  Continue current regimen of Lantus, Accu-Chek and sliding scale  Severe protein calorie malnutrition - Protein supplement ordered  Generalized weakness-physical therapy/Occupational Therapy-recommending skilled nursing facility  DVT prophylaxis: SCDs Code Status: Okay for intubation, no CPR.  Partial code Family Communication:  None at bedside this morning Disposition Plan:  Maintain inpatient stay until endoscopy today, then discharge him once cleared by oncology  Consultants:   Gastroenterology  Oncology  Interventional  radiology  Critical care  Vascular surgery  Palliative care  Procedures:   Endoscopy 12/17  EUS 12/26  Chemo-Port placement January 21  Antimicrobials:   Completed course of vancomycin and cefepime   Subjective: Patient continues to want to eat and requesting food.  He knows why he is n.p.o.  Denies any other complaints.  Review of Systems Otherwise negative except as per HPI, including: General = no fevers, chills, dizziness, malaise, fatigue HEENT/EYES = negative for pain, redness, loss of vision, double vision, blurred vision, loss of hearing, sore throat, hoarseness, dysphagia Cardiovascular= negative for chest pain, palpitation, murmurs, lower extremity swelling Respiratory/lungs= negative for shortness of breath, cough, hemoptysis, wheezing, mucus production Gastrointestinal= negative for nausea, vomiting,, abdominal pain, melena, hematemesis Genitourinary= negative for Dysuria, Hematuria, Change in Urinary Frequency MSK = Negative for arthralgia, myalgias, Back Pain, Joint swelling  Neurology= Negative for headache, seizures, numbness, tingling  Psychiatry= Negative for anxiety, depression, suicidal and homocidal ideation Allergy/Immunology= Medication/Food allergy as listed  Skin= Negative for Rash, lesions, ulcers, itching   Objective: Vitals:   10/28/18 0607 10/28/18 1322 10/28/18 2131 10/29/18 0558  BP: (!) 129/98 (!) 125/95 (!) 118/99 (!) 121/91  Pulse: 94 86 86 81  Resp: 20 18 16 17   Temp: 98.7 F (37.1 C) 98.6 F (37 C) 98 F (36.7 C) 98.3 F (36.8 C)  TempSrc: Oral Oral Oral Oral  SpO2: 98% 100% 100% 100%  Weight:      Height:        Intake/Output Summary (Last 24 hours) at 10/29/2018 1216 Last data filed at 10/29/2018 1135 Gross per 24 hour  Intake 1948.11 ml  Output 2725 ml  Net -776.89 ml   Filed Weights   09/28/18 1500 09/28/18 1914 10/11/18 1057  Weight: 59 kg 57.6 kg 54.7 kg    Examination:  General exam: Appears calm and  comfortable, frail.  Bilateral temporal wasting. Respiratory system: Clear to auscultation. Respiratory effort normal. Cardiovascular system: S1 & S2 heard, RRR. No JVD, murmurs, rubs, gallops or clicks. No pedal edema. Gastrointestinal system: Abdomen is nondistended, soft and nontender. No organomegaly or masses felt. Normal bowel sounds heard. Central nervous system: Alert and oriented. No focal neurological deficits. Extremities: Symmetric 5 x 5 power. Skin: Right lower extremity dressing noted.  Dry skin on his foot. Psychiatry: Judgement and insight appear normal. Mood & affect appropriate.     Data Reviewed:   CBC: Recent Labs  Lab 10/27/18 0830 10/28/18 0947 10/29/18 0608  WBC 9.2 10.5 9.5  NEUTROABS 7.6  --  7.9*  HGB 8.5* 8.7* 8.4*  HCT 28.0* 27.9* 27.5*  MCV 89.7 90.9 88.1  PLT 319 354 017   Basic Metabolic Panel: Recent Labs  Lab 10/27/18 0830 10/29/18 0608  NA 135 134*  K 4.1 3.7  CL 100 99  CO2 29 28  GLUCOSE 237* 184*  BUN 9 6  CREATININE 0.39* 0.37*  CALCIUM 7.7* 7.6*   GFR: Estimated Creatinine Clearance: 78.8 mL/min (A) (by C-G formula based on SCr of 0.37 mg/dL (L)). Liver Function Tests: Recent Labs  Lab 10/27/18 0830 10/29/18 0608  AST 18 13*  ALT 17 14  ALKPHOS 84 69  BILITOT 0.3 0.4  PROT 6.4* 6.0*  ALBUMIN 1.5* 1.4*   No results for input(s): LIPASE, AMYLASE in the last 168 hours. No results for input(s): AMMONIA in the last 168 hours. Coagulation Profile: No results for input(s): INR, PROTIME in the last 168 hours. Cardiac Enzymes: No results for input(s): CKTOTAL, CKMB, CKMBINDEX,  TROPONINI in the last 168 hours. BNP (last 3 results) No results for input(s): PROBNP in the last 8760 hours. HbA1C: No results for input(s): HGBA1C in the last 72 hours. CBG: Recent Labs  Lab 10/28/18 1646 10/28/18 1739 10/28/18 2130 10/29/18 0719 10/29/18 1133  GLUCAP 51* 151* 153* 179* 128*   Lipid Profile: No results for input(s): CHOL,  HDL, LDLCALC, TRIG, CHOLHDL, LDLDIRECT in the last 72 hours. Thyroid Function Tests: No results for input(s): TSH, T4TOTAL, FREET4, T3FREE, THYROIDAB in the last 72 hours. Anemia Panel: No results for input(s): VITAMINB12, FOLATE, FERRITIN, TIBC, IRON, RETICCTPCT in the last 72 hours. Sepsis Labs: No results for input(s): PROCALCITON, LATICACIDVEN in the last 168 hours.  No results found for this or any previous visit (from the past 240 hour(s)).       Radiology Studies: No results found.      Scheduled Meds: . [MAR Hold] atorvastatin  20 mg Oral QHS  . [MAR Hold] carvedilol  3.125 mg Oral BID WC  . [MAR Hold] dronabinol  5 mg Oral BID AC  . [MAR Hold] feeding supplement  1 Container Oral BID BM  . [MAR Hold] feeding supplement (ENSURE ENLIVE)  237 mL Oral BID BM  . [MAR Hold] gabapentin  400 mg Oral TID PC & HS  . [MAR Hold] insulin aspart  0-9 Units Subcutaneous TID AC & HS  . [MAR Hold] insulin detemir  4 Units Subcutaneous Daily  . [MAR Hold] morphine  15 mg Oral Q12H  . [MAR Hold] pantoprazole  40 mg Oral BID   Continuous Infusions: . dextrose 5 % and 0.45% NaCl 75 mL/hr at 10/29/18 0609     LOS: 56 days   Time spent= 25 mins    Ryan Medina Arsenio Loader, MD [facility]  If 7PM-7AM, please contact night-coverage [email or web address] 10/29/2018, 12:16 PM

## 2018-10-29 NOTE — Op Note (Signed)
[facility] Patient Name: Ryan Medina Procedure Date: 10/29/2018 MRN: [id number] Attending MD: Estill Cotta. Loletha Carrow , MD Date of Birth: [birth year] CSN: [id number] Age: 58 Admit Type: Inpatient Procedure:                Upper GI endoscopy Indications:              Upper abdominal pain, Follow-up of chronic duodenal                            ulcer with hemorrhage (caused massive UGIB Dec                            2019), Gastric outlet obstruction from pancreatic                            cancer - seen on CT scan Providers:                Mallie Mussel L. Loletha Carrow, MD, Dorise Hiss, RN, Carmie End, RN, Elspeth Cho Tech., Technician,                            Dion Saucier, CRNA Referring MD:             [facility] Medicines:                Monitored Anesthesia Care Complications:            No immediate complications. Estimated Blood Loss:     Estimated blood loss: none. Procedure:                Pre-Anesthesia Assessment:                           - Prior to the procedure, a History and Physical                            was performed, and patient medications and                            allergies were reviewed. The patient's tolerance of                            previous anesthesia was also reviewed. The risks                            and benefits of the procedure and the sedation                            options and risks were discussed with the patient.                            All questions were answered, and informed consent  was obtained. Prior Anticoagulants: The patient has                            taken heparin, last dose was day of procedure. ASA                            Grade Assessment: IV - A patient with severe                            systemic disease that is a constant threat to life.                            After reviewing the risks and benefits, the patient                             was deemed in satisfactory condition to undergo the                            procedure.                           After obtaining informed consent, the endoscope was                            passed under direct vision. Throughout the                            procedure, the patient's blood pressure, pulse, and                            oxygen saturations were monitored continuously. The                            [id number]-H190 ([id number]) Olympus gastroscope was                            introduced through the mouth, and advanced to the                            pylorus. The upper GI endoscopy was somewhat                            difficult due to a partially obstructing mass and                            poor endoscopic visualization. The patient                            tolerated the procedure well. Scope In: Scope Out: Findings:      LA Grade C (one or more mucosal breaks continuous between tops of 2 or       more mucosal folds, less than 75% circumference) esophagitis with no       bleeding was found in the distal esophagus.  Esophagitis with no bleeding was found in the upper third of the       esophagus.      A large amount of food (residue) and liquid was found in the gastric       fundus and in the gastric body. Not much could be suctioned out becayse       scope suction kept getting clogged with debris.      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       This was non-traversed, and there was barely any visible lumen. The       scope was also looping due to gastric dilatation.      An examination of the duodenum was not performed. Impression:               - LA Grade C reflux esophagitis.                           - Candidiasis esophagitis.                           - A large amount of food (residue) in the stomach.                           - Gastric stenosis was  found at the pylorus.                           - No specimens collected. Moderate Sedation:      Not Applicable - Patient had care per Anesthesia. Recommendation:           - Return patient to [facility] for ongoing care.                           - NPO.                           - Needs NGT placement before resuming heparin later                            today.                           Fluconazole 100 mg once daily x 14 days.                           I will confer with colleagues, but doubt this                            malignant stenosis is amenable to stent placement                            since there is virtually no visible lumen, and                            there is known to be a large malignant ulcer  occupying the duodenal bulb. Procedure Code(s):        --- Professional ---                           [id number], 69, Esophagogastroduodenoscopy, flexible,                            transoral; diagnostic, including collection of                            specimen(s) by brushing or washing, when performed                            (separate procedure) Diagnosis Code(s):        --- Professional ---                           K21.0, Gastro-esophageal reflux disease with                            esophagitis                           B37.81, Candidal esophagitis                           K31.1, Adult hypertrophic pyloric stenosis                           R10.10, Upper abdominal pain, unspecified                           K26.4, Chronic or unspecified duodenal ulcer with                            hemorrhage CPT copyright 2018 [employer]. All rights reserved. The codes documented in this report are preliminary and upon coder review may  be revised to meet current compliance requirements. Henry L. Loletha Carrow, MD 10/29/2018 2:46:59 PM This report has been signed electronically. Number of Addenda: 0

## 2018-10-29 NOTE — Progress Notes (Signed)
Pharmacy: Re- heparin  Patient's a 58 y.o M with extensive PMH including adenocarcinoma of the pancreas, hemorrhagic shock secondary to duodenal ulcer bleeding, and ischemic right foot right popliteal and tibial artery embolus (s/p embolectomy).  Chest CTA on 2/5 showed chronic bilateral PE. Heparin drip started on 2/5 for PE.  Heparin level is therapeutic at 0.38 (goal 0.3-0.5).  Plan: - Continue heparin drip at 1500 units/hr - No infusion interuptions or bleeding reported by RN.  - monitor for s/s bleeding - GI recommends to stop heparin drip 4 hrs prior to EGD procedure on 10/29/2018. Will stop heparin at 0700 on 2/7 for EGD  Dorrene German 10/29/2018 1:20 AM

## 2018-10-29 NOTE — Progress Notes (Signed)
Patient ID: Ryan Medina, male   DOB: 30-Mar-1961, 58 y.o.   MRN: [id number]     Progress Note   Subjective    Upset this am about not being able to eat . Denies abdominal pain , no N/V  Heparin off at 8 am   hgb 8.4 this am   Objective   Vital signs in last 24 hours: Temp:  [98 F (36.7 C)-98.6 F (37 C)] 98.3 F (36.8 C) (02/07 0558) Pulse Rate:  [81-86] 81 (02/07 0558) Resp:  [16-18] 17 (02/07 0558) BP: (118-125)/(91-99) 121/91 (02/07 0558) SpO2:  [100 %] 100 % (02/07 0558) Last BM Date: 10/28/18 General:    AA male  in NAD, cachectic appearing Heart:  Regular rate and rhythm; no murmurs Lungs: Respirations even and unlabored, lungs CTA bilaterally Abdomen:  Soft, much less full  In upper abdomen , BS present  Extremities:  Without edema. Neurologic:  Alert and oriented,  grossly normal neurologically. Psych:  Cooperative. Normal mood and affect.  Intake/Output from previous day: 02/06 0701 - 02/07 0700 In: 2188.1 [P.O.:960; I.V.:1228.1] Out: 2525 [Urine:2525] Intake/Output this shift: Total I/O In: -  Out: 200 [Urine:200]  Lab Results: Recent Labs    10/27/18 0830 10/28/18 0947 10/29/18 0608  WBC 9.2 10.5 9.5  HGB 8.5* 8.7* 8.4*  HCT 28.0* 27.9* 27.5*  PLT 319 354 341   BMET Recent Labs    10/27/18 0830 10/29/18 0608  NA 135 134*  K 4.1 3.7  CL 100 99  CO2 29 28  GLUCOSE 237* 184*  BUN 9 6  CREATININE 0.39* 0.37*  CALCIUM 7.7* 7.6*   LFT Recent Labs    10/29/18 0608  PROT 6.0*  ALBUMIN 1.4*  AST 13*  ALT 14  ALKPHOS 69  BILITOT 0.4   PT/INR No results for input(s): LABPROT, INR in the last 72 hours.  Studies/Results: Ct Chest W Contrast  Addendum Date: 10/27/2018   ADDENDUM REPORT: 10/27/2018 13:41 ADDENDUM: The original report was by Dr. Van Clines. The following addendum is by Dr. Van Clines: Critical Value/emergent results were called by telephone at the time of interpretation on 10/27/2018 at 1:40 pm to Dr. Gerlean Ren , who verbally acknowledged these results. Electronically Signed   By: Van Clines M.D.   On: 10/27/2018 13:41   Result Date: 10/27/2018 CLINICAL DATA:  Pancreatic adenocarcinoma with duodenal ulcer, chemotherapy and radiation therapy in progress. EXAM: CT CHEST, ABDOMEN, AND PELVIS WITH CONTRAST TECHNIQUE: Multidetector CT imaging of the chest, abdomen and pelvis was performed following the standard protocol during bolus administration of intravenous contrast. CONTRAST:  160mL OMNIPAQUE IOHEXOL 300 MG/ML  SOLN COMPARISON:  Multiple exams, including CT abdomen from 09/27/2018 FINDINGS: CT CHEST FINDINGS Cardiovascular: Today's exam was not performed for pulmonary arterial assessment, but there is mostly peripheral and segmental right lower lobe filling defect in the pulmonary artery compatible with pulmonary embolus, likely chronic. Similar peripheral segmental and subsegmental filling defects in the left upper lobe and left lower lobe pulmonary arteries. Right Port-A-Cath tip: Cavoatrial junction. Atherosclerotic calcification of the aortic arch and branch vessels. Mediastinum/Nodes: Hazy density in the mediastinal adipose tissue as well as the subcutaneous adipose tissue possibly from third spacing of fluid. Right lower paratracheal node 1.1 cm in short axis on image 42/7. Lungs/Pleura: Bandlike scarring or atelectasis in the left lower lobe at the site of prior airspace opacity, mildly improved although there is some continued anterior airspace opacity. Musculoskeletal: Small oblong sclerotic lesion in the left eighth  rib lateral, not changed from 08/22/2018 common no cortical irregularity or cortical destruction. CT ABDOMEN PELVIS FINDINGS Hepatobiliary: Lateral segment left hepatic lobe cyst on image 88/7. Potential 4 mm hypodense lesion in the right hepatic lobe on image 102/7, technically nonspecific. Pancreas: Highly ill-defined pancreas. Indistinct stranding around the celiac trunk and superior  mesenteric artery. Hypodensity favoring pancreatic body mass measuring about 2.4 by 4.1 cm on image 108/7. Dilated dorsal pancreatic duct. Spleen: Unremarkable. Adrenals/Urinary Tract: Small hypodense renal lesions are technically too small to characterize although statistically likely to be benign. Adrenal glands unremarkable. Stomach/Bowel: Prominently dilated stomach filled with food material. Poor definition of the duodenum. I can not exclude gastric outlet obstruction. Presumed vascular coils in the gastroduodenal artery. Poor definition of bowel wall due to the diffuse edema. Vascular/Lymphatic: Mild gastric varices. Infrarenal abdominal aortic aneurysm 3.1 cm in diameter. Aneurysmal bilateral common iliac arteries. Ectatic external iliac arteries. The portal vein and SMV remain patent. Varices are noted along the omentum and stomach. Reproductive: Unremarkable Other: Diffuse subcutaneous and mesenteric edema. Musculoskeletal: Spondylosis and degenerative disc disease at L5-S1. IMPRESSION: 1. Chronic appearing bilateral pulmonary embolus most notably involving the right lower lobe. 2. Mild enlargement of the pancreatic mass. No findings of metastatic disease to the liver currently. Small cyst in the lateral segment left hepatic lobe. 3. Notable dilatation of the stomach which is filled with food. This raises the possibility of gastric outlet obstruction or narrowing along the descending duodenum. No bowel dilatation. 4. Third spacing of fluid with diffuse subcutaneous, mesenteric, and mediastinal edema. 5. Gastric and omental varices likely from portal venous hypertension. 6. Mild aneurysmal dilatation of the abdominal aorta at 3.1 cm with aneurysmal iliac arteries. Recommend followup by [place] in 3 years. This recommendation follows ACR consensus guidelines: White Paper of the ACR Incidental Findings Committee II on Vascular Findings. J Am Coll Radiol 2013; 10:789-794. 7.  Aortic Atherosclerosis (ICD10-I70.0).  Radiology assistant personnel have been notified to put me in telephone contact with the referring physician or the referring physician's clinical representative in order to discuss these findings. Once this communication is established I will issue an addendum to this report for documentation purposes. Electronically Signed: By: Van Clines M.D. On: 10/27/2018 13:00   Ct Abdomen Pelvis W Contrast  Addendum Date: 10/27/2018   ADDENDUM REPORT: 10/27/2018 13:41 ADDENDUM: The original report was by Dr. Van Clines. The following addendum is by Dr. Van Clines: Critical Value/emergent results were called by telephone at the time of interpretation on 10/27/2018 at 1:40 pm to Dr. Gerlean Ren , who verbally acknowledged these results. Electronically Signed   By: Van Clines M.D.   On: 10/27/2018 13:41   Result Date: 10/27/2018 CLINICAL DATA:  Pancreatic adenocarcinoma with duodenal ulcer, chemotherapy and radiation therapy in progress. EXAM: CT CHEST, ABDOMEN, AND PELVIS WITH CONTRAST TECHNIQUE: Multidetector CT imaging of the chest, abdomen and pelvis was performed following the standard protocol during bolus administration of intravenous contrast. CONTRAST:  133mL OMNIPAQUE IOHEXOL 300 MG/ML  SOLN COMPARISON:  Multiple exams, including CT abdomen from 09/27/2018 FINDINGS: CT CHEST FINDINGS Cardiovascular: Today's exam was not performed for pulmonary arterial assessment, but there is mostly peripheral and segmental right lower lobe filling defect in the pulmonary artery compatible with pulmonary embolus, likely chronic. Similar peripheral segmental and subsegmental filling defects in the left upper lobe and left lower lobe pulmonary arteries. Right Port-A-Cath tip: Cavoatrial junction. Atherosclerotic calcification of the aortic arch and branch vessels. Mediastinum/Nodes: Hazy density in the mediastinal adipose tissue  as well as the subcutaneous adipose tissue possibly from third spacing of  fluid. Right lower paratracheal node 1.1 cm in short axis on image 42/7. Lungs/Pleura: Bandlike scarring or atelectasis in the left lower lobe at the site of prior airspace opacity, mildly improved although there is some continued anterior airspace opacity. Musculoskeletal: Small oblong sclerotic lesion in the left eighth rib lateral, not changed from 08/22/2018 common no cortical irregularity or cortical destruction. CT ABDOMEN PELVIS FINDINGS Hepatobiliary: Lateral segment left hepatic lobe cyst on image 88/7. Potential 4 mm hypodense lesion in the right hepatic lobe on image 102/7, technically nonspecific. Pancreas: Highly ill-defined pancreas. Indistinct stranding around the celiac trunk and superior mesenteric artery. Hypodensity favoring pancreatic body mass measuring about 2.4 by 4.1 cm on image 108/7. Dilated dorsal pancreatic duct. Spleen: Unremarkable. Adrenals/Urinary Tract: Small hypodense renal lesions are technically too small to characterize although statistically likely to be benign. Adrenal glands unremarkable. Stomach/Bowel: Prominently dilated stomach filled with food material. Poor definition of the duodenum. I can not exclude gastric outlet obstruction. Presumed vascular coils in the gastroduodenal artery. Poor definition of bowel wall due to the diffuse edema. Vascular/Lymphatic: Mild gastric varices. Infrarenal abdominal aortic aneurysm 3.1 cm in diameter. Aneurysmal bilateral common iliac arteries. Ectatic external iliac arteries. The portal vein and SMV remain patent. Varices are noted along the omentum and stomach. Reproductive: Unremarkable Other: Diffuse subcutaneous and mesenteric edema. Musculoskeletal: Spondylosis and degenerative disc disease at L5-S1. IMPRESSION: 1. Chronic appearing bilateral pulmonary embolus most notably involving the right lower lobe. 2. Mild enlargement of the pancreatic mass. No findings of metastatic disease to the liver currently. Small cyst in the lateral  segment left hepatic lobe. 3. Notable dilatation of the stomach which is filled with food. This raises the possibility of gastric outlet obstruction or narrowing along the descending duodenum. No bowel dilatation. 4. Third spacing of fluid with diffuse subcutaneous, mesenteric, and mediastinal edema. 5. Gastric and omental varices likely from portal venous hypertension. 6. Mild aneurysmal dilatation of the abdominal aorta at 3.1 cm with aneurysmal iliac arteries. Recommend followup by [place] in 3 years. This recommendation follows ACR consensus guidelines: White Paper of the ACR Incidental Findings Committee II on Vascular Findings. J Am Coll Radiol 2013; 10:789-794. 7.  Aortic Atherosclerosis (ICD10-I70.0). Radiology assistant personnel have been notified to put me in telephone contact with the referring physician or the referring physician's clinical representative in order to discuss these findings. Once this communication is established I will issue an addendum to this report for documentation purposes. Electronically Signed: By: Van Clines M.D. On: 10/27/2018 13:00       Assessment / Plan:     #1 58 yo AA male with advanced pancreatic adenocarcinoma diagnosed December 2019.  He has had invasion of the duodenum, and had a massive GI bleed from a large deep cratered malignant ulcer in December 2019.  He ultimately required embolization and coiling of the gastroduodenal artery for control of bleeding.  He has now completed a course of radiation while inpatient.  Repeat CTs on 10/27/2018, unfortunately have shown enlargement of the pancreatic neck mass, and a dilated stomach full of food and fluid concerning for gastric outlet obstruction is likely secondary to large amount of the duodenal infiltration/ulcerated mass.   #2 lateral pulmonary emboli found on chest CT 10/27/2018.  Patient had not had any acute symptoms, these are felt to be subacute.  IV heparin has been initiated  Patient is still  felt to be at significantly increased risk  of hemorrhage from malignant duodenal ulcerated mass.  Will continue to need observation and serial hemoglobins  #3 new right lower paratracheal node  #4 anemia-  Plan; Patient is scheduled for upper endoscopy this afternoon with Dr. Loletha Carrow, hopefully has stomach has emptied enough to allow for good visualization.  Heparin off for now. The recommendations pending endings at EGD.           Principal Problem:   Popliteal artery embolus (HCC) Active Problems:   DM (diabetes mellitus), type 2, uncontrolled ([place])   Essential hypertension   Dilated cardiomyopathy (HCC)   Chest pain   AAA (abdominal aortic aneurysm) (HCC)   Ischemic foot   Right leg claudication (HCC)   PVD (peripheral vascular disease) ([place])   Hemorrhagic shock ([place])   Nontraumatic ischemic infarction of muscle of right lower leg   Upper GI bleed   Cocaine abuse ([place])   Acute blood loss anemia   Chronic pain syndrome   Duodenal ulcer with hemorrhage   Pancreatic cancer ([place])   Goals of care, counseling/discussion   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

## 2018-10-29 NOTE — Interval H&P Note (Signed)
History and Physical Interval Note:  10/29/2018 2:06 PM  Ryan Medina  has presented today for surgery, with the diagnosis of gastric outlet obstruction, pancreatic cancer  The various methods of treatment have been discussed with the patient and family. After consideration of risks, benefits and other options for treatment, the patient has consented to  Procedure(s): ESOPHAGOGASTRODUODENOSCOPY (EGD) WITH PROPOFOL (N/A) as a surgical intervention .  The patient's history has been reviewed, patient examined, no change in status, stable for surgery.  I have reviewed the patient's chart and labs.  Questions were answered to the patient's satisfaction.     Nelida Meuse III

## 2018-10-29 NOTE — Transfer of Care (Signed)
Immediate Anesthesia Transfer of Care Note  Patient: Ryan Medina  Procedure(s) Performed: ESOPHAGOGASTRODUODENOSCOPY (EGD) WITH PROPOFOL (N/A )  Patient Location: PACU and Endoscopy Unit  Anesthesia Type:MAC  Level of Consciousness: awake, oriented, drowsy and patient cooperative  Airway & Oxygen Therapy: Patient Spontanous Breathing and Patient connected to nasal cannula oxygen  Post-op Assessment: Report given to RN, Post -op Vital signs reviewed and stable and Patient moving all extremities  Post vital signs: Reviewed and stable  Last Vitals:  Vitals Value Taken Time  BP 83/49 10/29/2018  2:25 PM  Temp 36.7 C 10/29/2018  2:24 PM  Pulse 86 10/29/2018  2:29 PM  Resp 26 10/29/2018  2:29 PM  SpO2 100 % 10/29/2018  2:29 PM  Vitals shown include unvalidated device data.  Last Pain:  Vitals:   10/29/18 1424  TempSrc: Axillary  PainSc:       Patients Stated Pain Goal: 3 (21/19/41 7408)  Complications: No apparent anesthesia complications

## 2018-10-30 DIAGNOSIS — K311 Adult hypertrophic pyloric stenosis: Secondary | ICD-10-CM | POA: Diagnosis present

## 2018-10-30 LAB — CBC WITH DIFFERENTIAL/PLATELET
Abs Immature Granulocytes: 0.03 10*3/uL (ref 0.00–0.07)
Basophils Absolute: 0 10*3/uL (ref 0.0–0.1)
Basophils Relative: 0 %
EOS ABS: 0 10*3/uL (ref 0.0–0.5)
Eosinophils Relative: 0 %
HCT: 31 % — ABNORMAL LOW (ref 39.0–52.0)
Hemoglobin: 9.6 g/dL — ABNORMAL LOW (ref 13.0–17.0)
IMMATURE GRANULOCYTES: 0 %
Lymphocytes Relative: 9 %
Lymphs Abs: 0.8 10*3/uL (ref 0.7–4.0)
MCH: 27.6 pg (ref 26.0–34.0)
MCHC: 31 g/dL (ref 30.0–36.0)
MCV: 89.1 fL (ref 80.0–100.0)
Monocytes Absolute: 0.6 10*3/uL (ref 0.1–1.0)
Monocytes Relative: 7 %
Neutro Abs: 7.4 10*3/uL (ref 1.7–7.7)
Neutrophils Relative %: 84 %
PLATELETS: 414 10*3/uL — AB (ref 150–400)
RBC: 3.48 MIL/uL — ABNORMAL LOW (ref 4.22–5.81)
RDW: 14.4 % (ref 11.5–15.5)
WBC: 8.9 10*3/uL (ref 4.0–10.5)
nRBC: 0 % (ref 0.0–0.2)

## 2018-10-30 LAB — COMPREHENSIVE METABOLIC PANEL
ALT: 14 U/L (ref 0–44)
AST: 13 U/L — AB (ref 15–41)
Albumin: 1.4 g/dL — ABNORMAL LOW (ref 3.5–5.0)
Alkaline Phosphatase: 72 U/L (ref 38–126)
Anion gap: 9 (ref 5–15)
BUN: 7 mg/dL (ref 6–20)
CO2: 26 mmol/L (ref 22–32)
Calcium: 8 mg/dL — ABNORMAL LOW (ref 8.9–10.3)
Chloride: 98 mmol/L (ref 98–111)
Creatinine, Ser: 0.41 mg/dL — ABNORMAL LOW (ref 0.61–1.24)
GFR calc Af Amer: >60 mL/min (ref >60)
GFR calc non Af Amer: >60 mL/min (ref >60)
Glucose, Bld: 124 mg/dL — ABNORMAL HIGH (ref 70–99)
Potassium: 3.6 mmol/L (ref 3.5–5.1)
Sodium: 133 mmol/L — ABNORMAL LOW (ref 135–145)
Total Bilirubin: 0.5 mg/dL (ref 0.3–1.2)
Total Protein: 6.4 g/dL — ABNORMAL LOW (ref 6.5–8.1)

## 2018-10-30 LAB — GLUCOSE, CAPILLARY
GLUCOSE-CAPILLARY: 143 mg/dL — AB (ref 70–99)
Glucose-Capillary: 123 mg/dL — ABNORMAL HIGH (ref 70–99)
Glucose-Capillary: 139 mg/dL — ABNORMAL HIGH (ref 70–99)
Glucose-Capillary: 192 mg/dL — ABNORMAL HIGH (ref 70–99)
Glucose-Capillary: 133 mg/dL — ABNORMAL HIGH (ref 70–99)

## 2018-10-30 LAB — HEPARIN LEVEL (UNFRACTIONATED)
Heparin Unfractionated: 0.82 IU/mL — ABNORMAL HIGH (ref 0.30–0.70)
Heparin Unfractionated: 0.16 IU/mL — ABNORMAL LOW (ref 0.30–0.70)
Heparin Unfractionated: 0.47 IU/mL (ref 0.30–0.70)

## 2018-10-30 NOTE — Progress Notes (Signed)
Went in room to check pt on rounds, NG tube laying in floor. Pt states he may have pulled it out in his sleep. He says he doesn't want it put back in regardless of what the MDs say. He has had the tube clamped today and has been taking clear liquids with no nausea. I told him to take it easy on the fluids. On call NP made aware. Continue to monitor. Hortencia Conradi RN

## 2018-10-30 NOTE — Progress Notes (Signed)
[place] for Heparin - no bolus Indication: PE  Allergies  Allergen Reactions  . Lisinopril Anaphylaxis    Patient Measurements: Height: 5\' 11"  (180.3 cm) Weight: 120 lb 9.6 oz (54.7 kg) IBW/kg (Calculated) : 75.3 Heparin Dosing Weight: 66.7  Vital Signs: Temp: 98.6 F (37 C) (02/08 1335) Temp Source: Oral (02/08 1335) BP: 133/95 (02/08 1335) Pulse Rate: 88 (02/08 1335)  Labs: Recent Labs    10/28/18 0947  10/29/18 5027 10/30/18 0253 10/30/18 0626 10/30/18 1404  HGB 8.7*  --  8.4*  --  9.6*  --   HCT 27.9*  --  27.5*  --  31.0*  --   PLT 354  --  341  --  414*  --   HEPARINUNFRC <0.10*   < >  --  0.82* 0.16* 0.47  CREATININE  --   --  0.37*  --  0.41*  --    < > = values in this interval not displayed.   Estimated Creatinine Clearance: 78.8 mL/min (A) (by C-G formula based on SCr of 0.41 mg/dL (L)).  Assessment: 58 yo male with arterial embolus s/p embolectomy. Patient had development of hematoma with compartment syndrome as well as hemorrhagic shock secondary to a bleeding duodenal ulcer.   Events this admission on 09/03/18:  LLE DVT/ischemia s/p urgent left popliteal artery embolectomy 12/13 >> RLE hematoma on 12/15 am s/p evacuation in OR.Heparin stopped 2/2 drop in hgb, hematemesis. -Heparin cautiously started 12/22,had few episodes of hematemesis & nose bleed 12/24> stopped heparin per dr. Candiss Norse S/p EUS 12/26:  pancreatic mass eroding into duodenum -VVS notes ikely too high risk for [facility]  12/28- Dr. Candiss Norse noted Continue PPI and Hold Heparin indefinitely  2/5: pharmacy consulted to dose heparin with no bolus by Dr Marin Olp for acute PE. Previously on 12/24 his heparin level was therapeutic at 0.35 on 1200 units/hr. 2.7: Heparin off at 09 am for EDG, heparin to resume after NG placed per GI instructions. KUB 1629> NG in stomach.   Today, 10/30/2018  3rd shift update: 0144 HL drawn but couldn't run b/c instrument ran out of  reagent (or something else?) 0253 HL - still pending called lab they said it was 1.2 but needed to be diluted for final result. Called RN she said lab was drawn from opposite side of Heparin infusion, but she was redrawing now. Of note HL 2/7 was 0.3 on rate of 1500 units/hr, Heparin level ordered again for 0630. 0630 HL = 0.16 on 1500 units/hr, Hgb 9.6 with Plt 414. No sign of bleed per RN, no problem with infusion or IV site 1400 Hep level = 0.47 units/hr on 1800 units/hr   Goal of Therapy:  Heparin level 0.3-0.5 units/mL Monitor platelets by anticoagulation protocol: Yes  Plan:  Continue Heparin infusion at 1800 units/hr  Daily HL and CBC while on heparin  Minda Ditto PharmD Pager [phone] 10/30/2018, 3:04 PM

## 2018-10-30 NOTE — Progress Notes (Signed)
Called to lab, s/w [place]   , calling to find out status of heparin level sent 2.5 hours ago. States heparin level is still running and will take about 15 more minutes.

## 2018-10-30 NOTE — Progress Notes (Signed)
[place] for Heparin - no bolus Indication: PE  Allergies  Allergen Reactions  . Lisinopril Anaphylaxis    Patient Measurements: Height: 5\' 11"  (180.3 cm) Weight: 120 lb 9.6 oz (54.7 kg) IBW/kg (Calculated) : 75.3 Heparin Dosing Weight: 66.7  Vital Signs: Temp: 98.2 F (36.8 C) (02/08 0517) Temp Source: Oral (02/08 0517) BP: 125/93 (02/08 0517) Pulse Rate: 86 (02/08 0517)  Labs: Recent Labs    10/27/18 0830  10/28/18 0947  10/29/18 0038 10/29/18 0608 10/30/18 0253 10/30/18 0626  HGB 8.5*  --  8.7*  --   --  8.4*  --  9.6*  HCT 28.0*  --  27.9*  --   --  27.5*  --  31.0*  PLT 319  --  354  --   --  341  --  414*  HEPARINUNFRC  --    < > <0.10*   < > 0.38  --  0.82* 0.16*  CREATININE 0.39*  --   --   --   --  0.37*  --  0.41*   < > = values in this interval not displayed.   Estimated Creatinine Clearance: 78.8 mL/min (A) (by C-G formula based on SCr of 0.41 mg/dL (L)).  Assessment: 58 yo male with arterial embolus s/p embolectomy. Patient had development of hematoma with compartment syndrome as well as hemorrhagic shock secondary to a bleeding duodenal ulcer.   Events this admission on 09/03/18:  LLE DVT/ischemia s/p urgent left popliteal artery embolectomy 12/13 >> RLE hematoma on 12/15 am s/p evacuation in OR.Heparin stopped 2/2 drop in hgb, hematemesis. -Heparin cautiously started 12/22,had few episodes of hematemesis & nose bleed 12/24> stopped heparin per dr. Candiss Norse S/p EUS 12/26:  pancreatic mass eroding into duodenum -VVS notes ikely too high risk for [facility]  12/28- Dr. Candiss Norse noted Continue PPI and Hold Heparin indefinitely  2/5: pharmacy consulted to dose heparin with no bolus by Dr Marin Olp for acute PE. Previously on 12/24 his heparin level was therapeutic at 0.35 on 1200 units/hr. 2.7: Heparin off at 09 am for EDG, heparin to resume after NG placed per GI instructions. KUB 1629> NG in stomach.   10/30/2018 3rd shift update:  0144 HL drawn but couldn't run b/c instrument ran out of reagent (or something else?) 0253 HL - still pending called lab they said it was 1.2 but needed to be diluted for final result. Called RN she said lab was drawn from opposite side of Heparin infusion, but she was redrawing now. Of note HL 2/7 was 0.3 on rate of 1500 units/hr, Heparin level ordered again for 0630. 0630 HL = 0.16 on 1500 units/hr, Hgb 9.6 with Plt 414. No sign of bleed per RN, no problem with infusion or IV site   Goal of Therapy:  Heparin level 0.3-0.5 units/mL Monitor platelets by anticoagulation protocol: Yes     Plan:  Increase Heparin infusion to 1800 units/hr  Recheck Hep level in 6 hr  Daily HL and CBC while on heparin  Minda Ditto PharmD Pager [phone] 10/30/2018, 7:39 AM

## 2018-10-30 NOTE — Progress Notes (Signed)
Unfortunately, looks like we may have a big issue.  He had his upper endoscopy yesterday.  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  Food cannot appear to be passing through.  He has an NG tube in.  He clearly is not happy but having the NG tube in.  He is not getting any nutrition.  Does not look like this stenosis can be stented.  I am not convinced that chemotherapy will be able to open up this stenosis all that quickly.  I think that we need to get surgery involved.  I would like to think that there is some kind of palliative bypass procedure that they can do to try to help alleviate this stenosis so that he can eat and that his quality of life will be better.  He is on heparin.  I know he has the pulmonary emboli.  He is hypercoagulable.  He is not bleeding as far as I can tell.  His labs today show white cell count of 8.9.  Hemoglobin 9.6.  Platelet count 414,000.  His BUN is 7 and creatinine 0.41.  His BUN is 1.4.  There really is no obvious change on his exam.  His vital signs show temperature of 98.2.  Pulse 86.  Blood pressure 125/93.  His abdomen is slightly distended.  Bowel sounds are present.  There is no obvious fluid wave.  There is no mass.  His lungs are clear bilaterally.  Cardiac exam regular rate and rhythm.  Extremities shows a dressing on his right foot.  He has no swelling in his legs.  Again, I think that we need to get surgery's input now.  Again, I realize that this pancreatic tumor is not resectable but we should at least be able to do something to help bypass this stenosis.  Chemotherapy I think will help with his malignancy but it may take several weeks before we see a response.  As such, I just do not think that we need to have an NG tube in him that long.  He really needs to be able to eat.  I know that he would be willing to have surgery.  Again, I really think surgery will improve his quality of life in order to bypass  this stenosis.  I very much appreciate all the great care that he is getting from the staff up here on [name]  I know that they are doing a great job with him.  [name], MD  Romans 8:28

## 2018-10-30 NOTE — Progress Notes (Signed)
PROGRESS NOTE    Ryan Medina  LYY:[id number] DOB: [birth year] DOA: 09/03/2018 PCP: Patient, No Pcp Per   Brief Narrative:   58 year old with history of peripheral vascular disease, dilated cardiomyopathy, essential hypertension, diabetes mellitus type 2 initially came to the hospital with right lower extremity pain and was found to have ischemic leg requiring embolectomy on 12/13.  He was placed on heparin drip subsequently developed right lower extremity hematoma requiring fasciotomy on 12/15.  Unfortunately afterwards he decompensated with active GI bleed and was intubated and was moved to the ICU.  He required embolization on 12/17.  During the course of his hospitalization he is required total of 26 units of blood.  Further evaluation showed pancreatic cancer with duodenal ulcer.  Transferred to [place] [facility] for radiation treatment to help with bleeding.  Hospital course complicated by sepsis from healthcare acquired pneumonia and Pseudomonas UTI requiring treatment with vancomycin and cefepime.  Patient found to have chronic PE therefore started on heparin drip per oncology.  Repeat endoscopy showed worsening of gastric outlet obstruction.  NG tube was placed and general surgery was consulted.  Assessment & Plan:   Principal Problem:   Popliteal artery embolus (HCC) Active Problems:   DM (diabetes mellitus), type 2, uncontrolled ([place])   Essential hypertension   Dilated cardiomyopathy (HCC)   Chest pain   AAA (abdominal aortic aneurysm) (HCC)   Ischemic foot   Right leg claudication (HCC)   PVD (peripheral vascular disease) ([place])   Hemorrhagic shock ([place])   Nontraumatic ischemic infarction of muscle of right lower leg   Upper GI bleed   Cocaine abuse ([place])   Acute blood loss anemia   Chronic pain syndrome   Duodenal ulcer with hemorrhage   Pancreatic cancer ([place])   Goals of care, counseling/discussion   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  Status post radiation.  Per oncology plans for outpatient radiation and chemotherapy -Status post endoscopy 12/17, GDA embolization 12/17, status post radiation. - Continue PPI. - Endoscopy showed worsening of gastric outlet obstruction/pyloric stenosis secondary to malignancy.  Plans for outpatient chemotherapy.  Meantime we will consult general surgery to bypass this area so we can provide him with nutrition and get him ready for possible chemotherapy. - Minimal output from NG tube, will clamp it and feed patient clear liquid diet as he continues to insist.  Chronic bilateral pulmonary embolism -No obvious signs of bleeding.  Heparin drip per oncology  Sepsis secondary to H CAP and Pseudomonas urinary tract infection -Status post treatment with vancomycin and cefepime.  PICC line pulled out 1/26.  Ischemic right foot with right popliteal and tibial artery embolus -Status post embolectomy.  Complicated by compartment syndrome requiring fasciotomy. -Low-dose heparin started.  Closely monitor for any bleeding. -Lower extremity routine dressing, follow-up outpatient with vascular  Cardiomyopathy, combined systolic and diastolic CHF, chronic -Patient appears to be euvolemic in nature.  Continue to provide supportive care.  Uncontrolled diabetes mellitus type 2 -Hemoglobin A1c 18.2.  Continue current regimen of Lantus,  Accu-Chek and sliding scale  Severe protein calorie malnutrition - Protein  supplement ordered  Generalized weakness-physical therapy/Occupational Therapy-recommending skilled nursing facility  DVT prophylaxis: SCDs Code Status: Okay for intubation, no CPR.  Partial code Family Communication:  None at bedside this morning Disposition Plan:  Maintain inpatient stay  Consultants:   Gastroenterology  Oncology  Interventional radiology  Critical care  Vascular surgery  Palliative care  General surgery  Procedures:   Endoscopy 12/17  EUS 12/26  Chemo-Port placement January 21  Repeat endoscopy 2/7  Antimicrobials:   Completed course of vancomycin and cefepime   Subjective: Patient is adamant about eating and he understands what the issues with him are.  He understands why NG tube was placed.  At this time he really wants to have any sort of diet including liquid.  Review of Systems Otherwise negative except as per HPI, including: General = no fevers, chills, dizziness, malaise, fatigue HEENT/EYES = negative for pain, redness, loss of vision, double vision, blurred vision, loss of hearing, sore throat, hoarseness, dysphagia Cardiovascular= negative for chest pain, palpitation, murmurs, lower extremity swelling Respiratory/lungs= negative for shortness of breath, cough, hemoptysis, wheezing, mucus production Gastrointestinal= negative for nausea, vomiting,, abdominal pain, melena, hematemesis Genitourinary= negative for Dysuria, Hematuria, Change in Urinary Frequency MSK = Negative for arthralgia, myalgias, Back Pain, Joint swelling  Neurology= Negative for headache, seizures, numbness, tingling  Psychiatry= Negative for anxiety, depression, suicidal and homocidal ideation Allergy/Immunology= Medication/Food allergy as listed  Skin= Negative for Rash, lesions, ulcers, itching   Objective: Vitals:   10/29/18 1530 10/29/18 2040 10/30/18 0136 10/30/18 0517  BP: (!) 123/97 124/90 (!) 124/92 (!) 125/93  Pulse: 84 80 86 86  Resp: 16  17 14 17   Temp: 98.2 F (36.8 C) 98.2 F (36.8 C)  98.2 F (36.8 C)  TempSrc: Oral Oral  Oral  SpO2: 100% 99% 98% 99%  Weight:      Height:        Intake/Output Summary (Last 24 hours) at 10/30/2018 1149 Last data filed at 10/30/2018 0959 Gross per 24 hour  Intake 1925 ml  Output 1400 ml  Net 525 ml   Filed Weights   09/28/18 1914 10/11/18 1057 10/29/18 1219  Weight: 57.6 kg 54.7 kg 54.7 kg    Examination:  Constitutional: NAD, calm, comfortable, NG tube in place.  Cachectic frail Eyes: PERRL, lids and conjunctivae normal ENMT: Mucous membranes are moist. Posterior pharynx clear of any exudate or lesions.Normal dentition.  Neck: normal, supple, no masses, no thyromegaly Respiratory: clear to auscultation bilaterally, no wheezing, no crackles. Normal respiratory effort. No accessory muscle use.  Cardiovascular: Regular rate and rhythm, no murmurs / rubs / gallops. No extremity edema. 2+ pedal pulses. No carotid bruits.  Abdomen: no tenderness, no masses palpated. No hepatosplenomegaly. Bowel sounds positive.  Musculoskeletal: no clubbing / cyanosis. No joint deformity upper and lower extremities. Good ROM, no contractures. Normal muscle tone.  Skin: no rashes, lesions, ulcers. No induration Neurologic: CN 2-12 grossly intact. Sensation intact, DTR normal. Strength 4+/5 in all 4.  Psychiatric: Normal judgment and insight. Alert and oriented x 3. Normal mood.     Data Reviewed:   CBC: Recent Labs  Lab 10/27/18 0830 10/28/18 0947 10/29/18 0608 10/30/18 0626  WBC 9.2 10.5 9.5 8.9  NEUTROABS 7.6  --  7.9* 7.4  HGB 8.5* 8.7* 8.4* 9.6*  HCT 28.0* 27.9* 27.5* 31.0*  MCV 89.7 90.9 88.1 89.1  PLT 319 354 341 741*   Basic Metabolic Panel: Recent  Labs  Lab 10/27/18 0830 10/29/18 0608 10/30/18 0626  NA 135 134* 133*  K 4.1 3.7 3.6  CL 100 99 98  CO2 29 28 26   GLUCOSE 237* 184* 124*  BUN 9 6 7   CREATININE 0.39* 0.37* 0.41*  CALCIUM 7.7* 7.6* 8.0*   GFR: Estimated  Creatinine Clearance: 78.8 mL/min (A) (by C-G formula based on SCr of 0.41 mg/dL (L)). Liver Function Tests: Recent Labs  Lab 10/27/18 0830 10/29/18 0608 10/30/18 0626  AST 18 13* 13*  ALT 17 14 14   ALKPHOS 84 69 72  BILITOT 0.3 0.4 0.5  PROT 6.4* 6.0* 6.4*  ALBUMIN 1.5* 1.4* 1.4*   No results for input(s): LIPASE, AMYLASE in the last 168 hours. No results for input(s): AMMONIA in the last 168 hours. Coagulation Profile: No results for input(s): INR, PROTIME in the last 168 hours. Cardiac Enzymes: No results for input(s): CKTOTAL, CKMB, CKMBINDEX, TROPONINI in the last 168 hours. BNP (last 3 results) No results for input(s): PROBNP in the last 8760 hours. HbA1C: No results for input(s): HGBA1C in the last 72 hours. CBG: Recent Labs  Lab 10/29/18 2007 10/29/18 2325 10/30/18 0322 10/30/18 0719 10/30/18 1139  GLUCAP 95 101* 123* 139* 192*   Lipid Profile: No results for input(s): CHOL, HDL, LDLCALC, TRIG, CHOLHDL, LDLDIRECT in the last 72 hours. Thyroid Function Tests: No results for input(s): TSH, T4TOTAL, FREET4, T3FREE, THYROIDAB in the last 72 hours. Anemia Panel: No results for input(s): VITAMINB12, FOLATE, FERRITIN, TIBC, IRON, RETICCTPCT in the last 72 hours. Sepsis Labs: No results for input(s): PROCALCITON, LATICACIDVEN in the last 168 hours.  No results found for this or any previous visit (from the past 240 hour(s)).       Radiology Studies: Dg Abd Portable 1v  Result Date: 10/29/2018 CLINICAL DATA:  Follow-up gastric catheter placement EXAM: PORTABLE ABDOMEN - 1 VIEW COMPARISON:  None. FINDINGS: Gastric catheter is noted within the stomach. Changes of prior embolus therapy are noted in the right upper quadrant. Contrast material is noted scattered throughout the colon related to the recent CT examination. Mild retained fecal material is noted. IMPRESSION: Gastric catheter within the stomach. Electronically Signed   By: Inez Catalina M.D.   On: 10/29/2018  16:34        Scheduled Meds: . atorvastatin  20 mg Oral QHS  . carvedilol  3.125 mg Oral BID WC  . dronabinol  5 mg Oral BID AC  . feeding supplement  1 Container Oral BID BM  . feeding supplement (ENSURE ENLIVE)  237 mL Oral BID BM  . gabapentin  400 mg Oral TID PC & HS  . insulin aspart  0-9 Units Subcutaneous Q4H  . insulin detemir  4 Units Subcutaneous Daily  . morphine  15 mg Oral Q12H   Continuous Infusions: . dextrose 5 % and 0.45% NaCl Stopped (10/29/18 1230)  . fluconazole (DIFLUCAN) IV 100 mg (10/29/18 1708)  . heparin 1,800 Units/hr (10/30/18 1031)     LOS: 57 days   Time spent= 35 mins    Itzayana Pardy Arsenio Loader, MD [facility]  If 7PM-7AM, please contact night-coverage [email or web address] 10/30/2018, 11:49 AM

## 2018-10-31 ENCOUNTER — Encounter (HOSPITAL_COMMUNITY): Payer: Self-pay | Admitting: Surgery

## 2018-10-31 LAB — COMPREHENSIVE METABOLIC PANEL
ALT: 13 U/L (ref 0–44)
AST: 12 U/L — ABNORMAL LOW (ref 15–41)
Albumin: 1.4 g/dL — ABNORMAL LOW (ref 3.5–5.0)
Alkaline Phosphatase: 65 U/L (ref 38–126)
Anion gap: 8 (ref 5–15)
BUN: <5 mg/dL — ABNORMAL LOW (ref 6–20)
CALCIUM: 7.6 mg/dL — AB (ref 8.9–10.3)
CO2: 28 mmol/L (ref 22–32)
CREATININE: 0.42 mg/dL — AB (ref 0.61–1.24)
Chloride: 99 mmol/L (ref 98–111)
GFR calc Af Amer: >60 mL/min (ref >60)
GFR calc non Af Amer: >60 mL/min (ref >60)
Glucose, Bld: 227 mg/dL — ABNORMAL HIGH (ref 70–99)
Potassium: 3.3 mmol/L — ABNORMAL LOW (ref 3.5–5.1)
Sodium: 135 mmol/L (ref 135–145)
Total Bilirubin: 0.4 mg/dL (ref 0.3–1.2)
Total Protein: 6.1 g/dL — ABNORMAL LOW (ref 6.5–8.1)

## 2018-10-31 LAB — GLUCOSE, CAPILLARY
Glucose-Capillary: 117 mg/dL — ABNORMAL HIGH (ref 70–99)
Glucose-Capillary: 124 mg/dL — ABNORMAL HIGH (ref 70–99)
Glucose-Capillary: 138 mg/dL — ABNORMAL HIGH (ref 70–99)
Glucose-Capillary: 175 mg/dL — ABNORMAL HIGH (ref 70–99)
Glucose-Capillary: 186 mg/dL — ABNORMAL HIGH (ref 70–99)
Glucose-Capillary: 189 mg/dL — ABNORMAL HIGH (ref 70–99)
Glucose-Capillary: 201 mg/dL — ABNORMAL HIGH (ref 70–99)

## 2018-10-31 LAB — CBC WITH DIFFERENTIAL/PLATELET
Abs Immature Granulocytes: 0.05 10*3/uL (ref 0.00–0.07)
Basophils Absolute: 0 10*3/uL (ref 0.0–0.1)
Basophils Relative: 0 %
Eosinophils Absolute: 0 10*3/uL (ref 0.0–0.5)
Eosinophils Relative: 0 %
HCT: 25.9 % — ABNORMAL LOW (ref 39.0–52.0)
HEMOGLOBIN: 8.1 g/dL — AB (ref 13.0–17.0)
Immature Granulocytes: 1 %
Lymphocytes Relative: 11 %
Lymphs Abs: 0.9 10*3/uL (ref 0.7–4.0)
MCH: 28.1 pg (ref 26.0–34.0)
MCHC: 31.3 g/dL (ref 30.0–36.0)
MCV: 89.9 fL (ref 80.0–100.0)
MONO ABS: 0.7 10*3/uL (ref 0.1–1.0)
Monocytes Relative: 8 %
Neutro Abs: 6.4 10*3/uL (ref 1.7–7.7)
Neutrophils Relative %: 80 %
Platelets: 337 10*3/uL (ref 150–400)
RBC: 2.88 MIL/uL — ABNORMAL LOW (ref 4.22–5.81)
RDW: 14.2 % (ref 11.5–15.5)
WBC: 8.1 10*3/uL (ref 4.0–10.5)
nRBC: 0 % (ref 0.0–0.2)

## 2018-10-31 LAB — HEPARIN LEVEL (UNFRACTIONATED)
Heparin Unfractionated: 1.1 IU/mL — ABNORMAL HIGH (ref 0.30–0.70)
Heparin Unfractionated: 0.73 IU/mL — ABNORMAL HIGH (ref 0.30–0.70)

## 2018-10-31 MED ORDER — POTASSIUM CHLORIDE 10 MEQ/100ML IV SOLN
10.0000 meq | INTRAVENOUS | Status: AC
  Administered 2018-10-31 (×4): 10 meq via INTRAVENOUS
  Filled 2018-10-31 (×3): qty 100

## 2018-10-31 NOTE — Progress Notes (Signed)
PROGRESS NOTE    Ryan Medina  WIO:[id number] DOB: [birth year] DOA: 09/03/2018 PCP: Patient, No Pcp Per   Brief Narrative:   58 year old with history of peripheral vascular disease, dilated cardiomyopathy, essential hypertension, diabetes mellitus type 2 initially came to the hospital with right lower extremity pain and was found to have ischemic leg requiring embolectomy on 12/13.  He was placed on heparin drip subsequently developed right lower extremity hematoma requiring fasciotomy on 12/15.  Unfortunately afterwards he decompensated with active GI bleed and was intubated and was moved to the ICU.  He required embolization on 12/17.  During the course of his hospitalization he is required total of 26 units of blood.  Further evaluation showed pancreatic cancer with duodenal ulcer.  Transferred to [place] [facility] for radiation treatment to help with bleeding.  Hospital course complicated by sepsis from healthcare acquired pneumonia and Pseudomonas UTI requiring treatment with vancomycin and cefepime.  Patient found to have chronic PE therefore started on heparin drip per oncology.  Repeat endoscopy showed worsening of gastric outlet obstruction.  Initially NG tube placed by patient removed it.  Right now on clear liquid diet.  Assessment & Plan:   Principal Problem:   Popliteal artery embolus (HCC) Active Problems:   DM (diabetes mellitus), type 2, uncontrolled ([place])   Essential hypertension   Dilated cardiomyopathy (HCC)   Chest pain   AAA (abdominal aortic aneurysm) (HCC)   Ischemic foot   Right leg claudication (HCC)   PVD (peripheral vascular disease) ([place])   Hemorrhagic shock ([place])   Nontraumatic ischemic infarction of muscle of right lower leg   Upper GI bleed   Cocaine abuse ([place])   Acute blood loss anemia   Chronic pain syndrome   Duodenal ulcer with hemorrhage   Pancreatic cancer ([place])   Goals of care, counseling/discussion   Chronic duodenal ulcer with hemorrhage and  obstruction   Pancreatic adenocarcinoma ([place])   Palliative care by specialist   Protein-calorie malnutrition, severe   Abdominal distention   Cancer associated pain   HCAP (healthcare-associated pneumonia)   Acute lower UTI   Sepsis ([place])   Homelessness   Weakness generalized   Gastric outflow obstruction  Adenocarcinoma of pancreas Hemorrhagic shock due to GI bleed from duodenal ulcer, stable Gastric outlet obstruction - CT scan shows increase in size of the mass with involvement of intra-abdominal arteries, deemed to be inoperable.  Status post radiation.  Per oncology plans for outpatient radiation and chemotherapy -Status post endoscopy 12/17, GDA embolization 12/17, status post radiation. - Continue PPI. - Endoscopy showed worsening of gastric outlet obstruction/pyloric stenosis secondary to malignancy.  - NG tube removed by the patient.  Will not allow [place] to put this back in.  We will continue liquid diet as tolerated -General surgery consulted- plans for likely placement of GJ tube.  Chronic bilateral pulmonary embolism -Continue heparin drip at this time per oncology.  Sepsis secondary to H CAP and Pseudomonas urinary tract infection -Status post treatment with vancomycin and cefepime.  PICC line pulled out 1/26.  Ischemic right foot with right popliteal and tibial artery embolus -Status post embolectomy.  Complicated by compartment syndrome requiring fasciotomy. -Low-dose heparin started.  Closely monitor for any bleeding. -Lower extremity routine dressing, follow-up outpatient with vascular  Cardiomyopathy, combined systolic and diastolic CHF, chronic -Patient appears to be euvolemic in nature.  Continue to provide supportive care.  Uncontrolled diabetes mellitus type 2 -Hemoglobin A1c 18.2.    Continue Lantus, Accu-Chek and sliding scale  Severe  protein calorie malnutrition - Protein supplement ordered  Generalized weakness-physical therapy/Occupational  Therapy-recommending skilled nursing facility  DVT prophylaxis: SCDs Code Status: Okay for intubation, no CPR.  Partial code Family Communication:  None at bedside Disposition Plan:  Maintain inpatient stay  Consultants:   Gastroenterology  Oncology  Interventional radiology  Critical care  Vascular surgery  Palliative care  General surgery  Procedures:   Endoscopy 12/17  EUS 12/26  Chemo-Port placement January 21  Repeat endoscopy 2/7  Antimicrobials:   Completed course of vancomycin and cefepime   Subjective: Patient removed his NG tube yesterday.  States he is tolerating liquid diet therefore does not want this back in.  Still has very poor appetite and feels a little weak.  Review of Systems Otherwise negative except as per HPI, including: General = no fevers, chills, dizziness, malaise, fatigue HEENT/EYES = negative for pain, redness, loss of vision, double vision, blurred vision, loss of hearing, sore throat, hoarseness, dysphagia Cardiovascular= negative for chest pain, palpitation, murmurs, lower extremity swelling Respiratory/lungs= negative for shortness of breath, cough, hemoptysis, wheezing, mucus production Gastrointestinal= negative for nausea, vomiting,, abdominal pain, melena, hematemesis Genitourinary= negative for Dysuria, Hematuria, Change in Urinary Frequency MSK = Negative for arthralgia, myalgias, Back Pain, Joint swelling  Neurology= Negative for headache, seizures, numbness, tingling  Psychiatry= Negative for anxiety, depression, suicidal and homocidal ideation Allergy/Immunology= Medication/Food allergy as listed  Skin= Negative for Rash, lesions, ulcers, itching   Objective: Vitals:   10/30/18 0517 10/30/18 1335 10/30/18 2113 10/31/18 0521  BP: (!) 125/93 (!) 133/95 (!) 134/103 120/85  Pulse: 86 88 87 93  Resp: 17 16 20 17   Temp: 98.2 F (36.8 C) 98.6 F (37 C) 99.1 F (37.3 C) 98.4 F (36.9 C)  TempSrc: Oral Oral  Oral Oral  SpO2: 99% 100% 99% 100%  Weight:      Height:        Intake/Output Summary (Last 24 hours) at 10/31/2018 1117 Last data filed at 10/31/2018 3845 Gross per 24 hour  Intake 1110.19 ml  Output 4075 ml  Net -2964.81 ml   Filed Weights   09/28/18 1914 10/11/18 1057 10/29/18 1219  Weight: 57.6 kg 54.7 kg 54.7 kg    Examination: Constitutional: NAD, calm, comfortable, bilateral temporal wasting, cachectic Eyes: PERRL, lids and conjunctivae normal ENMT: Mucous membranes are moist. Posterior pharynx clear of any exudate or lesions.Normal dentition.  Neck: normal, supple, no masses, no thyromegaly Respiratory: clear to auscultation bilaterally, no wheezing, no crackles. Normal respiratory effort. No accessory muscle use.  Cardiovascular: Regular rate and rhythm, no murmurs / rubs / gallops. No extremity edema. 2+ pedal pulses. No carotid bruits.  Abdomen: no tenderness, no masses palpated. No hepatosplenomegaly. Bowel sounds positive.  Musculoskeletal: no clubbing / cyanosis. No joint deformity upper and lower extremities. Good ROM, no contractures. Normal muscle tone.  Skin: no rashes, lesions, ulcers. No induration Neurologic: CN 2-12 grossly intact. Sensation intact, DTR normal. Strength 5/5 in all 4.  Psychiatric: Normal judgment and insight. Alert and oriented x 3. Normal mood.     Data Reviewed:   CBC: Recent Labs  Lab 10/27/18 0830 10/28/18 0947 10/29/18 0608 10/30/18 0626 10/31/18 0503  WBC 9.2 10.5 9.5 8.9 8.1  NEUTROABS 7.6  --  7.9* 7.4 6.4  HGB 8.5* 8.7* 8.4* 9.6* 8.1*  HCT 28.0* 27.9* 27.5* 31.0* 25.9*  MCV 89.7 90.9 88.1 89.1 89.9  PLT 319 354 341 414* 364   Basic Metabolic Panel: Recent Labs  Lab 10/27/18 0830 10/29/18 0608  10/30/18 0626 10/31/18 0503  NA 135 134* 133* 135  K 4.1 3.7 3.6 3.3*  CL 100 99 98 99  CO2 29 28 26 28   GLUCOSE 237* 184* 124* 227*  BUN 9 6 7  <5*  CREATININE 0.39* 0.37* 0.41* 0.42*  CALCIUM 7.7* 7.6* 8.0* 7.6*    GFR: Estimated Creatinine Clearance: 78.8 mL/min (A) (by C-G formula based on SCr of 0.42 mg/dL (L)). Liver Function Tests: Recent Labs  Lab 10/27/18 0830 10/29/18 0608 10/30/18 0626 10/31/18 0503  AST 18 13* 13* 12*  ALT 17 14 14 13   ALKPHOS 84 69 72 65  BILITOT 0.3 0.4 0.5 0.4  PROT 6.4* 6.0* 6.4* 6.1*  ALBUMIN 1.5* 1.4* 1.4* 1.4*   No results for input(s): LIPASE, AMYLASE in the last 168 hours. No results for input(s): AMMONIA in the last 168 hours. Coagulation Profile: No results for input(s): INR, PROTIME in the last 168 hours. Cardiac Enzymes: No results for input(s): CKTOTAL, CKMB, CKMBINDEX, TROPONINI in the last 168 hours. BNP (last 3 results) No results for input(s): PROBNP in the last 8760 hours. HbA1C: No results for input(s): HGBA1C in the last 72 hours. CBG: Recent Labs  Lab 10/30/18 1559 10/30/18 2005 10/30/18 2347 10/31/18 0448 10/31/18 0816  GLUCAP 133* 143* 189* 201* 124*   Lipid Profile: No results for input(s): CHOL, HDL, LDLCALC, TRIG, CHOLHDL, LDLDIRECT in the last 72 hours. Thyroid Function Tests: No results for input(s): TSH, T4TOTAL, FREET4, T3FREE, THYROIDAB in the last 72 hours. Anemia Panel: No results for input(s): VITAMINB12, FOLATE, FERRITIN, TIBC, IRON, RETICCTPCT in the last 72 hours. Sepsis Labs: No results for input(s): PROCALCITON, LATICACIDVEN in the last 168 hours.  No results found for this or any previous visit (from the past 240 hour(s)).       Radiology Studies: Dg Abd Portable 1v  Result Date: 10/29/2018 CLINICAL DATA:  Follow-up gastric catheter placement EXAM: PORTABLE ABDOMEN - 1 VIEW COMPARISON:  None. FINDINGS: Gastric catheter is noted within the stomach. Changes of prior embolus therapy are noted in the right upper quadrant. Contrast material is noted scattered throughout the colon related to the recent CT examination. Mild retained fecal material is noted. IMPRESSION: Gastric catheter within the stomach.  Electronically Signed   By: Inez Catalina M.D.   On: 10/29/2018 16:34        Scheduled Meds: . atorvastatin  20 mg Oral QHS  . carvedilol  3.125 mg Oral BID WC  . dronabinol  5 mg Oral BID AC  . feeding supplement  1 Container Oral BID BM  . feeding supplement (ENSURE ENLIVE)  237 mL Oral BID BM  . gabapentin  400 mg Oral TID PC & HS  . insulin aspart  0-9 Units Subcutaneous Q4H  . insulin detemir  4 Units Subcutaneous Daily  . morphine  15 mg Oral Q12H   Continuous Infusions: . dextrose 5 % and 0.45% NaCl Stopped (10/30/18 1548)  . fluconazole (DIFLUCAN) IV 100 mg (10/30/18 1548)  . heparin 1,600 Units/hr (10/31/18 0926)  . potassium chloride 10 mEq (10/31/18 0924)     LOS: 58 days   Time spent= 25 mins     Arsenio Loader, MD [facility]  If 7PM-7AM, please contact night-coverage [email or web address] 10/31/2018, 11:17 AM

## 2018-10-31 NOTE — Progress Notes (Signed)
[place] for Heparin - no bolus Indication: PE  Allergies  Allergen Reactions  . Lisinopril Anaphylaxis    Patient Measurements: Height: 5\' 11"  (180.3 cm) Weight: 120 lb 9.6 oz (54.7 kg) IBW/kg (Calculated) : 75.3 Heparin Dosing Weight: 66.7  Vital Signs: Temp: 98.4 F (36.9 C) (02/09 0521) Temp Source: Oral (02/09 0521) BP: 120/85 (02/09 0521) Pulse Rate: 93 (02/09 0521)  Labs: Recent Labs    10/29/18 3790  10/30/18 0626 10/30/18 1404 10/31/18 0503 10/31/18 0723  HGB 8.4*  --  9.6*  --  8.1*  --   HCT 27.5*  --  31.0*  --  25.9*  --   PLT 341  --  414*  --  337  --   HEPARINUNFRC  --    < > 0.16* 0.47 1.10* 0.73*  CREATININE 0.37*  --  0.41*  --  0.42*  --    < > = values in this interval not displayed.   Estimated Creatinine Clearance: 78.8 mL/min (A) (by C-G formula based on SCr of 0.42 mg/dL (L)).  Assessment: 58 yo male with arterial embolus s/p embolectomy. Patient had development of hematoma with compartment syndrome as well as hemorrhagic shock secondary to a bleeding duodenal ulcer.   Events this admission on 09/03/18:  LLE DVT/ischemia s/p urgent left popliteal artery embolectomy 12/13 >> RLE hematoma on 12/15 am s/p evacuation in OR.Heparin stopped 2/2 drop in hgb, hematemesis. -Heparin cautiously started 12/22,had few episodes of hematemesis & nose bleed 12/24> stopped heparin per dr. Candiss Norse S/p EUS 12/26:  pancreatic mass eroding into duodenum -VVS notes ikely too high risk for [facility]  12/28- Dr. Candiss Norse noted Continue PPI and Hold Heparin indefinitely  2/5: pharmacy consulted to dose heparin with no bolus by Dr Marin Olp for acute PE. Previously on 12/24 his heparin level was therapeutic at 0.35 on 1200 units/hr. 2.7: Heparin off at 09 am for EDG, heparin to resume after NG placed per GI instructions. KUB 1629> NG in stomach.   10/30/2018: 3rd shift update: 0144 HL drawn but couldn't run b/c instrument ran out of reagent (or  something else?) 0253 HL - still pending called lab they said it was 1.2 but needed to be diluted for final result. Called RN she said lab was drawn from opposite side of Heparin infusion, but she was redrawing now. Of note HL 2/7 was 0.3 on rate of 1500 units/hr, Heparin level ordered again for 0630. 0630 HL = 0.16 on 1500 units/hr, Hgb 9.6 with Plt 414. No sign of bleed per RN, no problem with infusion or IV site  Today, 10/31/2018 0503 Hep level = 1.1, sample drawn from [facility] where Heparin infusing.  Heparin was stopped, port flushed, and sample drawn >> supra-therapeutic level. 0723 Hep level via venipuncture = 0.73, above desired rate, but an acceptable result   Goal of Therapy:  Heparin level 0.3-0.5 units/mL Monitor platelets by anticoagulation protocol: Yes     Plan:  Decrease Heparin infusion to 1600 units/hr  Daily HL and CBC while on heparin  Minda Ditto PharmD Pager [phone] 10/31/2018, 9:18 AM

## 2018-10-31 NOTE — Progress Notes (Signed)
[facility] Progress Note  Chief Complaint: Gastric outlet obstruction  History:  The NG tube came out last evening, but the patient has not been vomiting since then, and is tolerating clear liquid diet.  He has upper abdominal pain as before that is fairly constant and dull, nonradiating.  He was seen in surgical consultation this morning, and there is a tentative plan for gastrojejunal bypass with gastric and jejunal tubes sometime this week.  Ryan Medina seems agreeable to and at peace with that plan.  ROS: Cardiovascular:  no chest pain Respiratory: no dyspnea  Objective:  Med list reviewed  Vital signs in last 24 hrs: Vitals:   10/30/18 2113 10/31/18 0521  BP: (!) 134/103 120/85  Pulse: 87 93  Resp: 20 17  Temp: 99.1 F (37.3 C) 98.4 F (36.9 C)  SpO2: 99% 100%    Physical Exam Thin, chronically ill-appearing, he is alert conversational pleasant and in good spirits.  Cardiac: RRR without murmurs, S1S2 heard, no peripheral edema  Pulm: clear to auscultation bilaterally, normal RR and effort noted  Abdomen: soft, upper tenderness, with mild distention, tympanitic as before  Skin; warm and dry, no jaundice  Recent Labs:  CBC Latest Ref Rng & Units 10/31/2018 10/30/2018 10/29/2018  WBC 4.0 - 10.5 K/uL 8.1 8.9 9.5  Hemoglobin 13.0 - 17.0 g/dL 8.1(L) 9.6(L) 8.4(L)  Hematocrit 39.0 - 52.0 % 25.9(L) 31.0(L) 27.5(L)  Platelets 150 - 400 K/uL 337 414(H) 341    No results for input(s): INR in the last 168 hours. CMP Latest Ref Rng & Units 10/31/2018 10/30/2018 10/29/2018  Glucose 70 - 99 mg/dL 227(H) 124(H) 184(H)  BUN 6 - 20 mg/dL <5(L) 7 6  Creatinine 0.61 - 1.24 mg/dL 0.42(L) 0.41(L) 0.37(L)  Sodium 135 - 145 mmol/L 135 133(L) 134(L)  Potassium 3.5 - 5.1 mmol/L 3.3(L) 3.6 3.7  Chloride 98 - 111 mmol/L 99 98 99  CO2 22 - 32 mmol/L 28 26 28   Calcium 8.9 - 10.3 mg/dL 7.6(L) 8.0(L) 7.6(L)  Total Protein 6.5 - 8.1 g/dL 6.1(L) 6.4(L) 6.0(L)  Total Bilirubin 0.3 - 1.2 mg/dL 0.4 0.5  0.4  Alkaline Phos 38 - 126 U/L 65 72 69  AST 15 - 41 U/L 12(L) 13(L) 13(L)  ALT 0 - 44 U/L 13 14 14     @ASSESSMENTPLANBEGIN @ Assessment:  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  No GI bleeding at present with the patient back on heparin for pulmonary embolism.  I am afraid that this gastric outlet obstruction is not amenable to stent placement.  Surgical plan seems most appropriate.  If the patient were to start passing melena indicating upper GI bleed, his heparin would have to be stopped immediately.  Unfortunately, rebleeding from this malignant ulcer would not be amenable to endoscopic management of the gastric outlet obstruction.  It previously required coil embolization of the GDA.  We will sign off.  Ryan Medina Office: [phone]

## 2018-10-31 NOTE — Consult Note (Signed)
General Surgery [facility]  Reason for Consult: gastric outlet obstruction secondary to locally advanced pancreatic adenocarcinoma  Referring Physician: Dr. Burney Gauze, medical oncology  Ryan Medina is an 58 y.o. male.  HPI: Patient is a 58 year old male currently admitted on the medical service with arterial embolism to the popliteal artery.  Patient has a known history of pancreatic adenocarcinoma which was not amenable to surgical resection.  He has been treated with external beam radiation therapy.  Patient had developed signs and symptoms of gastric outlet obstruction.  This past week he underwent an upper endoscopy which shows tumor occluding the gastric outlet at the level of the pylorus.  Nasogastric tube has been poorly tolerated and was removed during the night by the patient.  General surgery is now consulted for possible palliative bypass procedure for relief of gastric outlet obstruction.  Patient has had previous exploratory laparotomy through an upper midline surgical incision.  The exact details of this procedure are not known.  Other significant medical issues include diabetes, cardiomyopathy, hypertension, chronic pain, and substance abuse.  Past Medical History:  Diagnosis Date  . Back pain, chronic   . Diabetes ([place])   . Dilated cardiomyopathy ([place])    Echo 2/18: EF 45-50, diff HK, Gr 1 DD, trivial AI/TR  . Goals of care, counseling/discussion 09/24/2018  . HLD (hyperlipidemia)   . Hypertension   . IDDM (insulin dependent diabetes mellitus) ([place])   . Ruptured lumbar disc     Past Surgical History:  Procedure Laterality Date  . ABDOMINAL AORTOGRAM W/LOWER EXTREMITY Right 09/03/2018   Procedure: ABDOMINAL AORTOGRAM W/LOWER EXTREMITY;  Surgeon: Angelia Mould, MD;  Location: [facility];  Service: Cardiovascular;  Laterality: Right;  . ABDOMINAL SURGERY    . APPENDECTOMY    . APPLICATION OF WOUND VAC Right 09/05/2018   Procedure: APPLICATION OF WOUND VAC RIGHT LOWER MEDIAL AND LATERAL FASCIOTOMY SITE;  Surgeon: Angelia Mould, MD;  Location: [facility];  Service: Vascular;  Laterality: Right;  . BACK SURGERY    . BIOPSY  09/16/2018   Procedure: BIOPSY;  Surgeon: Milus Banister, MD;  Location: [facility];  Service: Endoscopy;;  . CARDIAC CATHETERIZATION N/A 05/19/2016   Procedure: Left Heart Cath and Coronary Angiography;  Surgeon: Leonie Man, MD;  Location: [facility];  Service: Cardiovascular;  Laterality: N/A;  . EMBOLECTOMY Right 09/03/2018   Procedure: Embolectomy Right Popliteal and Tibial Artery, Bovine Pericardium Patch Angioplasty Right Popliteal Artery;  Surgeon: Angelia Mould, MD;  Location: [facility];  Service: Vascular;  Laterality: Right;  . EMBOLECTOMY Right 09/10/2018   Procedure: EMBOLECTOMY RIGHT LOWER EXTREMITY;  Surgeon: Angelia Mould, MD;  Location: [facility];  Service: Vascular;  Laterality: Right;  . ESOPHAGOGASTRODUODENOSCOPY N/A 09/07/2018   Procedure: ESOPHAGOGASTRODUODENOSCOPY (EGD);  Surgeon: Irving Copas., MD;  Location: [facility];  Service: Gastroenterology;  Laterality: N/A;  . ESOPHAGOGASTRODUODENOSCOPY (EGD) WITH PROPOFOL N/A 09/16/2018   Procedure: ESOPHAGOGASTRODUODENOSCOPY (EGD) WITH PROPOFOL;  Surgeon: Milus Banister, MD;  Location: [facility];  Service: Endoscopy;  Laterality: N/A;  . EUS N/A 09/16/2018   Procedure: ESOPHAGEAL ENDOSCOPIC ULTRASOUND (EUS) RADIAL;  Surgeon: Milus Banister, MD;  Location: [facility];  Service: Endoscopy;  Laterality: N/A;  . FASCIOTOMY Right 09/05/2018   Procedure: FOUR COMPARTMENT FASCIOTOMY RIGHT LOWER LEG ;  Surgeon: Angelia Mould, MD;  Location: [facility] [facility];  Service: Vascular;  Laterality: Right;  . FASCIOTOMY CLOSURE Right 09/10/2018   Procedure: FASCIOTOMY CLOSURE  FOUR COMPARTMENT;  Surgeon: Angelia Mould, MD;  Location: [place];  Service: Vascular;  Laterality: Right;  . FINE  NEEDLE ASPIRATION  09/16/2018   Procedure: FINE NEEDLE ASPIRATION (FNA) LINEAR;  Surgeon: Milus Banister, MD;  Location: [facility] END[facility]COPY;  Service: Endoscopy;;  . HEMATOMA EVACUATION Right 09/05/2018   Procedure: EVACUATION HEMATOMA OF RIGHT LOWER EXTREMITY; EVACUATION OF LYMPHOCELE RIGHT LOWER LEG ;  Surgeon: Angelia Mould, MD;  Location: [facility];  Service: Vascular;  Laterality: Right;  . IR ANGIOGRAM SELECTIVE EACH ADDITIONAL VESSEL  09/07/2018  . IR ANGIOGRAM SELECTIVE EACH ADDITIONAL VESSEL  09/07/2018  . IR ANGIOGRAM VISCERAL SELECTIVE  09/07/2018  . IR ANGIOGRAM VISCERAL SELECTIVE  09/07/2018  . IR EMBO ART  VEN HEMORR LYMPH EXTRAV  INC GUIDE ROADMAPPING  09/07/2018  . IR IMAGING GUIDED PORT INSERTION  10/12/2018  . IR US GUIDE VASC ACCESS RIGHT  09/07/2018  . TEE WITHOUT CARDIOVERSION N/A 09/16/2018   Procedure: TRANSESOPHAGEAL ECHOCARDIOGRAM (TEE);  Surgeon: Milus Banister, MD;  Location: [facility] ENDOSCOPY;  Service: Endoscopy;  Laterality: N/A;  if cleared by GI    Family History  Problem Relation Age of Onset  . Diabetes Mother   . Heart failure Mother   . Diabetes Sister   . Diabetes Brother     Social History:  reports that he has been smoking cigarettes. He has a 6.25 pack-year smoking history. He has never used smokeless tobacco. He reports that he does not drink alcohol or use drugs.  Allergies:  Allergies  Allergen Reactions  . Lisinopril Anaphylaxis    Medications: I have reviewed the patient's current medications.  Results for orders placed or performed during the hospital encounter of 09/03/18 (from the past 48 hour(s))  Glucose, capillary     Status: Abnormal   Collection Time: 10/29/18 11:33 AM  Result Value Ref Range   Glucose-Capillary 128 (H) 70 - 99 mg/dL  Glucose, capillary     Status: Abnormal   Collection Time: 10/29/18  4:51 PM  Result Value Ref Range   Glucose-Capillary 37 (LL) 70 - 99 mg/dL   Comment 1 Notify RN    Comment 2 Document in  Chart   Glucose, capillary     Status: Abnormal   Collection Time: 10/29/18  5:32 PM  Result Value Ref Range   Glucose-Capillary 127 (H) 70 - 99 mg/dL   Comment 1 Notify RN    Comment 2 Document in Chart   Glucose, capillary     Status: None   Collection Time: 10/29/18  8:07 PM  Result Value Ref Range   Glucose-Capillary 95 70 - 99 mg/dL   Comment 1 Notify RN   Glucose, capillary     Status: Abnormal   Collection Time: 10/29/18 11:25 PM  Result Value Ref Range   Glucose-Capillary 101 (H) 70 - 99 mg/dL   Comment 1 Notify RN   Heparin level (unfractionated)     Status: Abnormal   Collection Time: 10/30/18  2:53 AM  Result Value Ref Range   Heparin Unfractionated 0.82 (H) 0.30 - 0.70 IU/mL    Comment: RESULTS CONFIRMED BY MANUAL DILUTION AGE OF SPECIMEN MAY AFFECT INTEGRITY OF RESULTS Performed at [facility], [place] [address] [place],  [address]   Glucose, capillary     Status: Abnormal   Collection Time: 10/30/18  3:22 AM  Result Value Ref Range   Glucose-Capillary 123 (H) 70 - 99 mg/dL   Comment 1 Notify RN   Heparin  level (unfractionated)     Status: Abnormal   Collection Time: 10/30/18  6:26 AM  Result Value Ref Range   Heparin Unfractionated 0.16 (L) 0.30 - 0.70 IU/mL    Comment: (NOTE) If heparin results are below expected values, and patient dosage has  been confirmed, suggest follow up testing of antithrombin III levels. Performed at [facility], [place] [address] [place], [place] [address]   CBC with Differential/Platelet     Status: Abnormal   Collection Time: 10/30/18  6:26 AM  Result Value Ref Range   WBC 8.9 4.0 - 10.5 K/uL   RBC 3.48 (L) 4.22 - 5.81 MIL/uL   Hemoglobin 9.6 (L) 13.0 - 17.0 g/dL   HCT 31.0 (L) 39.0 - 52.0 %   MCV 89.1 80.0 - 100.0 fL   MCH 27.6 26.0 - 34.0 pg   MCHC 31.0 30.0 - 36.0 g/dL   RDW 14.4 11.5 - 15.5 %   Platelets 414 (H) 150 - 400 K/uL   nRBC 0.0 0.0 - 0.2 %   Neutrophils Relative %  84 %   Neutro Abs 7.4 1.7 - 7.7 K/uL   Lymphocytes Relative 9 %   Lymphs Abs 0.8 0.7 - 4.0 K/uL   Monocytes Relative 7 %   Monocytes Absolute 0.6 0.1 - 1.0 K/uL   Eosinophils Relative 0 %   Eosinophils Absolute 0.0 0.0 - 0.5 K/uL   Basophils Relative 0 %   Basophils Absolute 0.0 0.0 - 0.1 K/uL   Immature Granulocytes 0 %   Abs Immature Granulocytes 0.03 0.00 - 0.07 K/uL    Comment: Performed at [facility], [place] [address], [place], [place] [address]  Comprehensive metabolic panel     Status: Abnormal   Collection Time: 10/30/18  6:26 AM  Result Value Ref Range   Sodium 133 (L) 135 - 145 mmol/L   Potassium 3.6 3.5 - 5.1 mmol/L   Chloride 98 98 - 111 mmol/L   CO2 26 22 - 32 mmol/L   Glucose, Bld 124 (H) 70 - 99 mg/dL   BUN 7 6 - 20 mg/dL   Creatinine, Ser 0.41 (L) 0.61 - 1.24 mg/dL   Calcium 8.0 (L) 8.9 - 10.3 mg/dL   Total Protein 6.4 (L) 6.5 - 8.1 g/dL   Albumin 1.4 (L) 3.5 - 5.0 g/dL   AST 13 (L) 15 - 41 U/L   ALT 14 0 - 44 U/L   Alkaline Phosphatase 72 38 - 126 U/L   Total Bilirubin 0.5 0.3 - 1.2 mg/dL   GFR calc non Af Amer >60 >60 mL/min   GFR calc Af Amer >60 >60 mL/min   Anion gap 9 5 - 15    Comment: Performed at [facility], [place] [address], [place], [place] [address]  Glucose, capillary     Status: Abnormal   Collection Time: 10/30/18  7:19 AM  Result Value Ref Range   Glucose-Capillary 139 (H) 70 - 99 mg/dL   Comment 1 Notify RN    Comment 2 Document in Chart   Glucose, capillary     Status: Abnormal   Collection Time: 10/30/18 11:39 AM  Result Value Ref Range   Glucose-Capillary 192 (H) 70 - 99 mg/dL   Comment 1 Notify RN    Comment 2 Document in Chart   Heparin level (unfractionated)     Status: None   Collection Time: 10/30/18  2:04 PM  Result Value Ref Range   Heparin Unfractionated 0.47 0.30 -  0.70 IU/mL    Comment: (NOTE) If heparin results are below expected values, and patient dosage has  been confirmed,  suggest follow up testing of antithrombin III levels. Performed at [facility], [place] [address], [place], [place] [address]   Glucose, capillary     Status: Abnormal   Collection Time: 10/30/18  3:59 PM  Result Value Ref Range   Glucose-Capillary 133 (H) 70 - 99 mg/dL   Comment 1 Notify RN    Comment 2 Document in Chart   Glucose, capillary     Status: Abnormal   Collection Time: 10/30/18  8:05 PM  Result Value Ref Range   Glucose-Capillary 143 (H) 70 - 99 mg/dL  Glucose, capillary     Status: Abnormal   Collection Time: 10/30/18 11:47 PM  Result Value Ref Range   Glucose-Capillary 189 (H) 70 - 99 mg/dL   Comment 1 Notify RN   Glucose, capillary     Status: Abnormal   Collection Time: 10/31/18  4:48 AM  Result Value Ref Range   Glucose-Capillary 201 (H) 70 - 99 mg/dL   Comment 1 Notify RN   CBC with Differential/Platelet     Status: Abnormal   Collection Time: 10/31/18  5:03 AM  Result Value Ref Range   WBC 8.1 4.0 - 10.5 K/uL   RBC 2.88 (L) 4.22 - 5.81 MIL/uL   Hemoglobin 8.1 (L) 13.0 - 17.0 g/dL   HCT 25.9 (L) 39.0 - 52.0 %   MCV 89.9 80.0 - 100.0 fL   MCH 28.1 26.0 - 34.0 pg   MCHC 31.3 30.0 - 36.0 g/dL   RDW 14.2 11.5 - 15.5 %   Platelets 337 150 - 400 K/uL   nRBC 0.0 0.0 - 0.2 %   Neutrophils Relative % 80 %   Neutro Abs 6.4 1.7 - 7.7 K/uL   Lymphocytes Relative 11 %   Lymphs Abs 0.9 0.7 - 4.0 K/uL   Monocytes Relative 8 %   Monocytes Absolute 0.7 0.1 - 1.0 K/uL   Eosinophils Relative 0 %   Eosinophils Absolute 0.0 0.0 - 0.5 K/uL   Basophils Relative 0 %   Basophils Absolute 0.0 0.0 - 0.1 K/uL   Immature Granulocytes 1 %   Abs Immature Granulocytes 0.05 0.00 - 0.07 K/uL    Comment: Performed at [facility], [place] [address] [place],  [address]  Comprehensive metabolic panel     Status: Abnormal   Collection Time: 10/31/18  5:03 AM  Result Value Ref Range   Sodium 135 135 - 145 mmol/L   Potassium 3.3 (L) 3.5 - 5.1  mmol/L   Chloride 99 98 - 111 mmol/L   CO2 28 22 - 32 mmol/L   Glucose, Bld 227 (H) 70 - 99 mg/dL   BUN <5 (L) 6 - 20 mg/dL   Creatinine, Ser 0.42 (L) 0.61 - 1.24 mg/dL   Calcium 7.6 (L) 8.9 - 10.3 mg/dL   Total Protein 6.1 (L) 6.5 - 8.1 g/dL   Albumin 1.4 (L) 3.5 - 5.0 g/dL   AST 12 (L) 15 - 41 U/L   ALT 13 0 - 44 U/L   Alkaline Phosphatase 65 38 - 126 U/L   Total Bilirubin 0.4 0.3 - 1.2 mg/dL   GFR calc non Af Amer >60 >60 mL/min   GFR calc Af Amer >60 >60 mL/min   Anion gap 8 5 - 15    Comment: Performed at [facility], [place] [address] [place], [place] [address]  Heparin level (unfractionated)     Status: Abnormal   Collection Time: 10/31/18  5:03 AM  Result Value Ref Range   Heparin Unfractionated 1.10 (H) 0.30 - 0.70 IU/mL    Comment: (NOTE) If heparin results are below expected values, and patient dosage has  been confirmed, suggest follow up testing of antithrombin III levels. Performed at [facility], [place] [address], [place], [place] [address]     Dg Abd Portable 1v  Result Date: 10/29/2018 CLINICAL DATA:  Follow-up gastric catheter placement EXAM: PORTABLE ABDOMEN - 1 VIEW COMPARISON:  None. FINDINGS: Gastric catheter is noted within the stomach. Changes of prior embolus therapy are noted in the right upper quadrant. Contrast material is noted scattered throughout the colon related to the recent CT examination. Mild retained fecal material is noted. IMPRESSION: Gastric catheter within the stomach. Electronically Signed   By: Inez Catalina M.D.   On: 10/29/2018 16:34    Review of Systems  Constitutional: Positive for weight loss.  HENT: Negative.   Eyes: Negative.   Respiratory: Negative.   Cardiovascular: Negative.   Gastrointestinal: Positive for nausea and vomiting.  Genitourinary: Negative.   Musculoskeletal: Negative.   Neurological: Negative.   Endo/Heme/Allergies: Negative.   Psychiatric/Behavioral: Positive for  substance abuse.   Blood pressure 120/85, pulse 93, temperature 98.4 F (36.9 C), temperature source Oral, resp. rate 17, height 5\' 11"  (1.803 m), weight 54.7 kg, SpO2 100 %. Physical Exam  Constitutional: He is oriented to person, place, and time. He appears well-developed. No distress.  HENT:  Head: Normocephalic and atraumatic.  Right Ear: External ear normal.  Left Ear: External ear normal.  Mouth/Throat: No oropharyngeal exudate.  Eyes: Pupils are equal, round, and reactive to light. Conjunctivae are normal. No scleral icterus.  Neck: Normal range of motion. Neck supple. No tracheal deviation present. No thyromegaly present.  Cardiovascular: Normal rate, regular rhythm and normal heart sounds.  No murmur heard. Respiratory: Effort normal and breath sounds normal. No respiratory distress. He has no wheezes.  GI: Soft. Bowel sounds are normal. He exhibits no distension and no mass. There is no abdominal tenderness.  Musculoskeletal: Normal range of motion.        General: No deformity or edema.     Comments: Dressing right ankle  Lymphadenopathy:    He has no cervical adenopathy.  Neurological: He is alert and oriented to person, place, and time.  Skin: Skin is warm and dry. He is not diaphoretic.  Psychiatric: He has a normal mood and affect. His behavior is normal.    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  There is gastric outlet obstruction due to direct tumor involvement of the pylorus.  Imaging does not show evidence of carcinomatosis.  I discussed palliative bypass procedure today with the patient.  We discussed performing a gastrojejunostomy.  I explained to him that this would create a bypass around the tumor.  However, with the large tumor and with his history of radiation, these bypass procedures often do not function as well as we would like.   Therefore I would also recommend placement of a gastrostomy tube and a feeding jejunostomy tube at the time of surgery.  This would hopefully avoid the need in the future for nasogastric decompression and allow access to the GI tract for nutritional support and medications.  I will discussed the case  with my partner, Dr. Kaylyn Lim, who is our hospital surgeon for the coming week.  Dr. Hassell Done will then evaluate the patient tomorrow and make a decision regarding surgery and the timing of any surgical procedure.  Armandina Gemma, [place] [facility] Office: [place] 10/31/2018, 7:48 AM

## 2018-11-01 ENCOUNTER — Encounter (HOSPITAL_COMMUNITY): Payer: Self-pay | Admitting: Gastroenterology

## 2018-11-01 ENCOUNTER — Ambulatory Visit: Payer: Self-pay

## 2018-11-01 LAB — CBC WITH DIFFERENTIAL/PLATELET
Abs Immature Granulocytes: 0.03 10*3/uL (ref 0.00–0.07)
BASOS ABS: 0 10*3/uL (ref 0.0–0.1)
Basophils Relative: 0 %
Eosinophils Absolute: 0 10*3/uL (ref 0.0–0.5)
Eosinophils Relative: 0 %
HEMATOCRIT: 25.8 % — AB (ref 39.0–52.0)
Hemoglobin: 7.9 g/dL — ABNORMAL LOW (ref 13.0–17.0)
Immature Granulocytes: 0 %
Lymphocytes Relative: 10 %
Lymphs Abs: 0.7 10*3/uL (ref 0.7–4.0)
MCH: 27.6 pg (ref 26.0–34.0)
MCHC: 30.6 g/dL (ref 30.0–36.0)
MCV: 90.2 fL (ref 80.0–100.0)
Monocytes Absolute: 0.7 10*3/uL (ref 0.1–1.0)
Monocytes Relative: 9 %
Neutro Abs: 5.7 10*3/uL (ref 1.7–7.7)
Neutrophils Relative %: 81 %
Platelets: 365 10*3/uL (ref 150–400)
RBC: 2.86 MIL/uL — ABNORMAL LOW (ref 4.22–5.81)
RDW: 14.3 % (ref 11.5–15.5)
WBC: 7.2 10*3/uL (ref 4.0–10.5)
nRBC: 0 % (ref 0.0–0.2)

## 2018-11-01 LAB — MAGNESIUM: Magnesium: 1.7 mg/dL (ref 1.7–2.4)

## 2018-11-01 LAB — COMPREHENSIVE METABOLIC PANEL
ALBUMIN: 1.4 g/dL — AB (ref 3.5–5.0)
ALK PHOS: 63 U/L (ref 38–126)
ALT: 13 U/L (ref 0–44)
AST: 12 U/L — ABNORMAL LOW (ref 15–41)
Anion gap: 9 (ref 5–15)
BILIRUBIN TOTAL: 0.4 mg/dL (ref 0.3–1.2)
BUN: <5 mg/dL — ABNORMAL LOW (ref 6–20)
CO2: 27 mmol/L (ref 22–32)
Calcium: 7.8 mg/dL — ABNORMAL LOW (ref 8.9–10.3)
Chloride: 101 mmol/L (ref 98–111)
Creatinine, Ser: 0.42 mg/dL — ABNORMAL LOW (ref 0.61–1.24)
GFR calc Af Amer: >60 mL/min (ref >60)
GFR calc non Af Amer: >60 mL/min (ref >60)
Glucose, Bld: 184 mg/dL — ABNORMAL HIGH (ref 70–99)
Potassium: 3.8 mmol/L (ref 3.5–5.1)
SODIUM: 137 mmol/L (ref 135–145)
Total Protein: 6 g/dL — ABNORMAL LOW (ref 6.5–8.1)

## 2018-11-01 LAB — GLUCOSE, CAPILLARY
Glucose-Capillary: 185 mg/dL — ABNORMAL HIGH (ref 70–99)
Glucose-Capillary: 178 mg/dL — ABNORMAL HIGH (ref 70–99)
Glucose-Capillary: 170 mg/dL — ABNORMAL HIGH (ref 70–99)
Glucose-Capillary: 146 mg/dL — ABNORMAL HIGH (ref 70–99)

## 2018-11-01 LAB — APTT: aPTT: 115 seconds — ABNORMAL HIGH (ref 24–36)

## 2018-11-01 LAB — HEPARIN LEVEL (UNFRACTIONATED): Heparin Unfractionated: 0.58 IU/mL (ref 0.30–0.70)

## 2018-11-01 MED ORDER — FLUCONAZOLE 100 MG PO TABS
100.0000 mg | ORAL_TABLET | Freq: Every day | ORAL | Status: DC
  Administered 2018-11-01 – 2018-11-03 (×3): 100 mg via ORAL
  Filled 2018-11-01 (×3): qty 1

## 2018-11-01 MED ORDER — ENSURE ENLIVE PO LIQD
237.0000 mL | Freq: Three times a day (TID) | ORAL | Status: DC
  Administered 2018-11-01 – 2018-11-02 (×3): 237 mL via ORAL

## 2018-11-01 NOTE — Progress Notes (Signed)
Patient ID: Ryan Medina, male   DOB: 31-Oct-1960, 58 y.o.   MRN: [id number]  This NP visited patient at the bedside as a follow up for palliative medicine needs and emotional support.  Ryan Medina is alert and oriented, SO/[name] is not at the bedsdie and patient tells me she left and he doesn't know where she is.  Patient reports that his pain is controlled with current medication regime, no complaints voiced. He is taking liquids without nausea or vomiting.   We discussed that medical records are reflecting discussion around need for surgical intervention for gastric outlet obstruction secondary to  advanced pancreatic carcinoma with liver mets.   Patient has multiple comorbidities: Malnutrition, ischemic right lower extremity which are compounded by dense psychosocial issues ( homeless  and minimal social support)  Patient verbalizes an understanding of the seriousness of his disease but remains hopeful for improvement.     Created space and opportunity for Ryan Medina to continue to explore his thoughts and feelings about his current medical situation.  He tells me he is open to "whatever they suggest".    His goal is continued quality of life.    Emotional support offered        Questions and concerns addressed      Palliative medicine team will continue to support holistically.  Total time spent on the unit was 45   minutes  Greater than 50% of the time was spent in counseling and coordination of care  Wadie Lessen NP  Palliative Medicine Team Team Phone # [phone] Pager [phone]

## 2018-11-01 NOTE — Progress Notes (Signed)
Mr. Moorman actually looks a little bit better.  The NG tube came out.  He is taking some liquids.  He has been seen by general surgery.  Hopefully, they will operate on him this week.  Again I think surgery with a bypass is the only way that he is going to be able to eat.  He is not having any issues with pain.  He is on heparin for the pulmonary emboli.  He has had no obvious bleeding.  His hemoglobin is 7.9.  He probably will need to be transfused if he is to undergo surgery.  He is out of bed.  He has had no cough.  There is no shortness of breath.  He is having bowel movements.  His labs show sodium 137 potassium 3.8.  His BUN is less than 5 creatinine 0.42.  His calcium is 7.8.  His albumin is only 1.4.  On his physical exam, his vital signs show temperature of 98.9.  Pulse 84.  Blood pressure 119/89.  Head neck exam shows no ocular or oral lesions.  He has no palpable cervical or supra clavicular lymph nodes.  Lungs are clear bilaterally.  He has no wheezes.  Cardiac exam regular rate and rhythm.  Abdomen is slightly distended.  Abdomen is soft.  Bowel sounds are present.  There is no guarding or rebound tenderness.  Extremities shows the surgical scars that are healed.  He has a dressing on his right foot.  Neurological exam is nonfocal.  Mr. Dilworth has locally advanced inoperable pancreatic cancer.  He has gastric outlet obstruction.  He hopefully will be going to surgery soon.  I do realize that surgery is not going to be curative.  He understands this.  He just wants to be able to eat again.  I does want his quality of life to be better.  Hopefully, when he does have surgery, the surgeons will get [place] a biopsy.  I am sure that they will see where the tumor is.  I really would like to get a biopsy so that I can have enough material sent off for molecular analysis.  I think this would be critical in order for [place] to come up with the best treatment recommendations for him.   I, again,  appreciate all the great care that he is getting from the staff up on [name]  [name], MD  2 Corinthians 12:9-10

## 2018-11-01 NOTE — Progress Notes (Signed)
3 Days Post-Op    CC: Pancreatic cancer with gastric outlet obstruction  Subjective: Patient sitting up comfortable in bed drinking breeze supplements.  He drank a whole container while I was there without any issue.  He denies any abdominal pain no nausea or vomiting.  NG was placed after EGD but patient denies any history of vomiting.  He appears to be chronically distended.  He has a midline surgical incision that is well-healed, but he appears very malnourished.  Objective: Vital signs in last 24 hours: Temp:  [98.9 F (37.2 C)-99.7 F (37.6 C)] 98.9 F (37.2 C) (02/10 0612) Pulse Rate:  [84-91] 84 (02/10 0612) Resp:  [16] 16 (02/10 0612) BP: (119-129)/(89-98) 119/89 (02/10 0612) SpO2:  [99 %-100 %] 99 % (02/10 0612) Last BM Date: 10/30/18 240 PO 400 IV 3050 urine NO BM recorded Afebrile, VSS Labs stable   Intake/Output from previous day: 02/09 0701 - 02/10 0700 In: 643.7 [P.O.:240; I.V.:403.7] Out: 3050 [Urine:3050] Intake/Output this shift: Total I/O In: 240 [P.O.:240] Out: 225 [Urine:225]  General appearance: alert, cooperative and no distress Resp: clear to auscultation bilaterally GI: Abdomen is distended but nontender.  Well-healed midline surgical incision.  He has a palpable mass right under the skin just below the xiphoid that almost feels like a button.  No abdominal pain or discomfort positive bowel sounds.  Lab Results:  Recent Labs    10/31/18 0503 11/01/18 0530  WBC 8.1 7.2  HGB 8.1* 7.9*  HCT 25.9* 25.8*  PLT 337 365    BMET Recent Labs    10/31/18 0503 11/01/18 0530  NA 135 137  K 3.3* 3.8  CL 99 101  CO2 28 27  GLUCOSE 227* 184*  BUN <5* <5*  CREATININE 0.42* 0.42*  CALCIUM 7.6* 7.8*   PT/INR No results for input(s): LABPROT, INR in the last 72 hours.  Recent Labs  Lab 10/27/18 0830 10/29/18 0608 10/30/18 0626 10/31/18 0503 11/01/18 0530  AST 18 13* 13* 12* 12*  ALT 17 14 14 13 13   ALKPHOS 84 69 72 65 63  BILITOT 0.3  0.4 0.5 0.4 0.4  PROT 6.4* 6.0* 6.4* 6.1* 6.0*  ALBUMIN 1.5* 1.4* 1.4* 1.4* 1.4*     Lipase     Component Value Date/Time   LIPASE 42 08/22/2018 1932   Prior to Admission medications   Medication Sig Start Date End Date Taking? Authorizing Provider  amLODipine (NORVASC) 10 MG tablet Take 1 tablet (10 mg total) by mouth daily. 08/25/18  Yes Ghimire, Henreitta Leber, MD  atorvastatin (LIPITOR) 20 MG tablet Take 1 tablet (20 mg total) by mouth daily. 08/25/18  Yes Ghimire, Henreitta Leber, MD  cyclobenzaprine (FLEXERIL) 10 MG tablet Take 1 tablet (10 mg total) by mouth 2 (two) times daily as needed for muscle spasms. 08/25/18  Yes Ghimire, Henreitta Leber, MD  gabapentin (NEURONTIN) 300 MG capsule Take 1 capsule (300 mg total) by mouth 3 (three) times daily. 08/25/18  Yes Ghimire, Henreitta Leber, MD  insulin aspart (NOVOLOG) 100 UNIT/ML injection Inject 0-15 Units into the skin 3 (three) times daily with meals. 0-15 Units, Subcutaneous, 3 times daily with meals CBG < 70: implement hypoglycemia protocol CBG 70 - 120: 0 units CBG 121 - 150: 2 units CBG 151 - 200: 3 units CBG 201 - 250: 5 units CBG 251 - 300: 8 units CBG 301 - 350: 11 units CBG 351 - 400: 15 units CBG > 400: call MD 08/25/18  Yes Ghimire, Henreitta Leber, MD  insulin  detemir (LEVEMIR) 100 UNIT/ML injection Inject 0.3 mLs (30 Units total) into the skin 2 (two) times daily. Patient taking differently: Inject 20-30 Units into the skin See admin instructions. Take 30 units in the morning, and 20 units in the evening 08/25/18  Yes Ghimire, Henreitta Leber, MD  metFORMIN (GLUCOPHAGE) 1000 MG tablet Take 1 tablet (1,000 mg total) by mouth 2 (two) times daily with a meal. 08/25/18  Yes Ghimire, Henreitta Leber, MD  nitroGLYCERIN (NITROSTAT) 0.4 MG SL tablet Place 1 tablet (0.4 mg total) under the tongue every 5 (five) minutes as needed for chest pain. 08/25/18  Yes Ghimire, Henreitta Leber, MD  dicyclomine (BENTYL) 20 MG tablet Take 1 tablet (20 mg total) by mouth 2 (two) times  daily. Patient not taking: Reported on 09/05/2018 08/25/18   Jonetta Osgood, MD  glucose blood (AGAMATRIX PRESTO TEST) test strip Check blood sugars prior to meals and at bedtime 08/25/18   Jonetta Osgood, MD      Medications: . atorvastatin  20 mg Oral QHS  . carvedilol  3.125 mg Oral BID WC  . dronabinol  5 mg Oral BID AC  . feeding supplement  1 Container Oral BID BM  . feeding supplement (ENSURE ENLIVE)  237 mL Oral BID BM  . fluconazole  100 mg Oral Daily  . gabapentin  400 mg Oral TID PC & HS  . insulin aspart  0-9 Units Subcutaneous Q4H  . insulin detemir  4 Units Subcutaneous Daily  . morphine  15 mg Oral Q12H   . dextrose 5 % and 0.45% NaCl Stopped (10/30/18 1548)  . heparin 1,600 Units/hr (10/31/18 1500)   Anti-infectives (From admission, onward)   Start     Dose/Rate Route Frequency Ordered Stop   11/01/18 1000  fluconazole (DIFLUCAN) tablet 100 mg     100 mg Oral Daily 11/01/18 0828 11/12/18 0959   10/29/18 1600  fluconazole (DIFLUCAN) IVPB 100 mg  Status:  Discontinued     100 mg 50 mL/hr over 60 Minutes Intravenous Every 24 hours 10/29/18 1529 11/01/18 0828   10/11/18 2300  vancomycin (VANCOCIN) IVPB 750 mg/150 ml premix  Status:  Discontinued     750 mg 150 mL/hr over 60 Minutes Intravenous Every 12 hours 10/11/18 1037 10/13/18 1954   10/11/18 1500  ceFAZolin (ANCEF) IVPB 2g/100 mL premix     2 g 200 mL/hr over 30 Minutes Intravenous To Radiology 10/08/18 1348 10/12/18 1500   10/11/18 1200  ceFEPIme (MAXIPIME) 1 g in sodium chloride 0.9 % 100 mL IVPB  Status:  Discontinued     1 g 200 mL/hr over 30 Minutes Intravenous Every 8 hours 10/11/18 1037 10/13/18 1954   10/11/18 1045  vancomycin (VANCOCIN) 1,250 mg in sodium chloride 0.9 % 250 mL IVPB     1,250 mg 166.7 mL/hr over 90 Minutes Intravenous  Once 10/11/18 1037 10/11/18 2057   10/07/18 1600  ceFEPIme (MAXIPIME) 1 g in sodium chloride 0.9 % 100 mL IVPB     1 g 200 mL/hr over 30 Minutes Intravenous  Every 8 hours 10/07/18 1452 10/08/18 0105   10/06/18 1000  fluconazole (DIFLUCAN) tablet 100 mg  Status:  Discontinued     100 mg Oral Daily 10/06/18 0709 10/11/18 0834   10/02/18 2100  vancomycin (VANCOCIN) IVPB 750 mg/150 ml premix  Status:  Discontinued     750 mg 150 mL/hr over 60 Minutes Intravenous Every 12 hours 10/02/18 0812 10/04/18 1420   10/02/18 0900  vancomycin (VANCOCIN) 1,250  mg in sodium chloride 0.9 % 250 mL IVPB     1,250 mg 166.7 mL/hr over 90 Minutes Intravenous  Once 10/02/18 0812 10/02/18 1132   10/02/18 0900  ceFEPIme (MAXIPIME) 1 g in sodium chloride 0.9 % 100 mL IVPB  Status:  Discontinued     1 g 200 mL/hr over 30 Minutes Intravenous Every 8 hours 10/02/18 0812 10/07/18 0925   10/02/18 0815  ceFEPIme (MAXIPIME) 1 g in sodium chloride 0.9 % 100 mL IVPB  Status:  Discontinued     1 g 200 mL/hr over 30 Minutes Intravenous Every 8 hours 10/02/18 0800 10/02/18 0812   10/02/18 0800  ceFEPIme (MAXIPIME) 2 g in sodium chloride 0.9 % 100 mL IVPB  Status:  Discontinued     2 g 200 mL/hr over 30 Minutes Intravenous  Once 10/02/18 0757 10/02/18 0800   10/02/18 0800  metroNIDAZOLE (FLAGYL) IVPB 500 mg  Status:  Discontinued     500 mg 100 mL/hr over 60 Minutes Intravenous Every 8 hours 10/02/18 0757 10/02/18 0800   10/02/18 0800  vancomycin (VANCOCIN) IVPB 1000 mg/200 mL premix  Status:  Discontinued     1,000 mg 200 mL/hr over 60 Minutes Intravenous  Once 10/02/18 0757 10/02/18 0800   09/27/18 0000  ceFAZolin (ANCEF) IVPB 2g/100 mL premix     2 g 200 mL/hr over 30 Minutes Intravenous To Radiology 09/25/18 0902 09/28/18 0000   09/10/18 2130  ceFAZolin (ANCEF) IVPB 2g/100 mL premix     2 g 200 mL/hr over 30 Minutes Intravenous Every 8 hours 09/10/18 1634 09/11/18 0700   09/10/18 0800  ceFAZolin (ANCEF) IVPB 1 g/50 mL premix    Note to Pharmacy:  Send with pt to OR   1 g 100 mL/hr over 30 Minutes Intravenous On call 09/09/18 0756 09/10/18 1407   09/07/18 1300   erythromycin 250 mg in sodium chloride 0.9 % 100 mL IVPB     250 mg 100 mL/hr over 60 Minutes Intravenous Once 09/07/18 1125 09/07/18 2351   09/06/18 0000  ceFAZolin (ANCEF) IVPB 1 g/50 mL premix    Note to Pharmacy:  Send with pt to OR   1 g 100 mL/hr over 30 Minutes Intravenous On call 09/05/18 0849 09/05/18 0952   09/03/18 2245  ceFAZolin (ANCEF) IVPB 2g/100 mL premix     2 g 200 mL/hr over 30 Minutes Intravenous Every 8 hours 09/03/18 2236 09/04/18 1444   09/03/18 1615  ceFAZolin (ANCEF) IVPB 2g/100 mL premix  Status:  Discontinued     2 g 200 mL/hr over 30 Minutes Intravenous On call to O.R. 09/03/18 1600 09/03/18 2035    EGD 10/29/18:LA Grade C (one or more mucosal breaks continuous between tops of 2 or more mucosal folds, less than 75% circumference) esophagitis with no bleeding was found in the distal esophagus. Findings: Esophagitis with no bleeding was found in the upper third of the esophagus. A large amount of food (residue) and liquid was found in the gastric fundus and in the gastric body. Not much could be suctioned out becayse scope suction kept getting clogged with debris. A malignant-appearing, intrinsic severe stenosis was found at the pylorus.It was not biopsied, as it is already known to be malignant. This was non-traversed, and there was barely any visible lumen. The scope was also looping due to gastric dilatation. An examination of the duodenum was not performed.   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 Chronic pain/hx of substance abuse -ETOH Anemia H/H:  7.9/25.8 Hx exploratory laparotomy,closure of pyloric channel ulcer with Phillip Heal patch 12/2003 Dr. 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 intake of at least clear liquids currently.  He is not having any nausea or vomiting with this.  He is extremely malnourished and deconditioned.  He has limited mobility secondary to ischemic right lower extremity.  Has multiple medical issues, and he is currently homeless.  I would recommend cardiac evaluation and medical evaluation to see if he is a candidate for this surgery.  Palliative care is working with him, and his significant other is no longer seeing him or in attendance.  He is still in the hospital secondary to being homeless.  I am going to order a calorie count to see how much he is actually taking in.  Pre-albumin.  Review with Dr. Hassell Done and medical staff to see if he is a reasonable surgical candidate.     LOS: 59 days    Annell Canty 11/01/2018 [phone]

## 2018-11-01 NOTE — Progress Notes (Signed)
PROGRESS NOTE    Ryan Medina  CNO:[id number] DOB: 09-Apr-1961 DOA: 09/03/2018 PCP: Patient, No Pcp Per   Brief Narrative:   58 year old with history of peripheral vascular disease, dilated cardiomyopathy, essential hypertension, diabetes mellitus type 2 initially came to the hospital with right lower extremity pain and was found to have ischemic leg requiring embolectomy on 12/13.  He was placed on heparin drip subsequently developed right lower extremity hematoma requiring fasciotomy on 12/15.  Unfortunately afterwards he decompensated with active GI bleed and was intubated and was moved to the ICU.  He required embolization on 12/17.  During the course of his hospitalization he is required total of 26 units of blood.  Further evaluation showed pancreatic cancer with duodenal ulcer.  Transferred to [facility] for radiation treatment to help with bleeding.  Hospital course complicated by sepsis from healthcare acquired pneumonia and Pseudomonas UTI requiring treatment with vancomycin and cefepime.  Patient found to have chronic PE therefore started on heparin drip per oncology.  Repeat endoscopy showed worsening of gastric outlet obstruction.  Initially NG tube placed by patient removed it.  Right now on clear liquid diet.  Assessment & Plan:   Principal Problem:   Popliteal artery embolus (HCC) Active Problems:   DM (diabetes mellitus), type 2, uncontrolled ([place])   Essential hypertension   Dilated cardiomyopathy (HCC)   Chest pain   AAA (abdominal aortic aneurysm) (HCC)   Ischemic foot   Right leg claudication (HCC)   PVD (peripheral vascular disease) ([place])   Hemorrhagic shock ([place])   Nontraumatic ischemic infarction of muscle of right lower leg   Upper GI bleed   Cocaine abuse ([place])   Acute blood loss anemia   Chronic pain syndrome   Duodenal ulcer with hemorrhage   Pancreatic cancer ([employer] [employer])   Goals of care, counseling/discussion   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  Status post radiation.  Per oncology plans for outpatient radiation and chemotherapy -Status post endoscopy 12/17, GDA embolization 12/17, status post radiation. - Continue PPI. - Endoscopy showed worsening of gastric outlet obstruction/pyloric stenosis secondary to malignancy.  - NG tube removed by the patient.  On clear liquid diet.  Tolerating it for now. -Neurosurgery to see the patient today for possible evaluation of GJ tube.  Chronic bilateral pulmonary embolism -Heparin drip per oncology  Sepsis secondary to H CAP and Pseudomonas urinary tract infection -Status post treatment with vancomycin and cefepime.  PICC line pulled out 1/26.  Ischemic right foot with right popliteal and tibial artery embolus -Status post embolectomy.  Complicated by compartment syndrome requiring fasciotomy. -Low-dose heparin started.  Closely monitor for any bleeding. -Lower extremity routine dressing, follow-up outpatient with vascular  Cardiomyopathy, combined systolic and diastolic CHF, chronic -Patient appears to be euvolemic in nature.  Continue to provide supportive care.  Uncontrolled diabetes mellitus type 2 -Hemoglobin A1c 18.2.    Continue Lantus, Accu-Chek and sliding scale.  Blood glucose in acceptable range  Severe protein calorie malnutrition - Protein supplement ordered  Generalized  weakness-physical therapy/Occupational Therapy-recommending skilled nursing facility  DVT prophylaxis: SCDs Code Status: Okay for intubation, no CPR.  Partial code Family Communication:  None at bedside Disposition Plan:  Maintain inpatient stay until he has been evaluated by surgery and we come up with more thorough nutrition plan.  Consultants:   Gastroenterology  Oncology  Interventional radiology  Critical care  Vascular surgery  Palliative care  General surgery  Procedures:   Endoscopy 12/17  EUS 12/26  Chemo-Port placement January 21  Repeat endoscopy 2/7  Antimicrobials:   Completed course of vancomycin and cefepime   Subjective: Tolerating liquid diet for now.  Denies any nausea, vomiting.  Review of Systems Otherwise negative except as per HPI, including: General = no fevers, chills, dizziness, malaise, fatigue HEENT/EYES = negative for pain, redness, loss of vision, double vision, blurred vision, loss of hearing, sore throat, hoarseness, dysphagia Cardiovascular= negative for chest pain, palpitation, murmurs, lower extremity swelling Respiratory/lungs= negative for shortness of breath, cough, hemoptysis, wheezing, mucus production Gastrointestinal= negative for nausea, vomiting,, abdominal pain, melena, hematemesis Genitourinary= negative for Dysuria, Hematuria, Change in Urinary Frequency MSK = Negative for arthralgia, myalgias, Back Pain, Joint swelling  Neurology= Negative for headache, seizures, numbness, tingling  Psychiatry= Negative for anxiety, depression, suicidal and homocidal ideation Allergy/Immunology= Medication/Food allergy as listed  Skin= Negative for Rash, lesions, ulcers, itching    Objective: Vitals:   10/31/18 0521 10/31/18 1401 10/31/18 1929 11/01/18 0612  BP: 120/85 (!) 129/94 (!) 127/98 119/89  Pulse: 93 91 89 84  Resp: 17  16 16   Temp: 98.4 F (36.9 C) 99 F (37.2 C) 99.7 F (37.6 C) 98.9 F (37.2 C)    TempSrc: Oral Oral Oral Oral  SpO2: 100% 100% 99% 99%  Weight:      Height:        Intake/Output Summary (Last 24 hours) at 11/01/2018 1111 Last data filed at 11/01/2018 1100 Gross per 24 hour  Intake 1123.73 ml  Output 2875 ml  Net -1751.27 ml   Filed Weights   09/28/18 1914 10/11/18 1057 10/29/18 1219  Weight: 57.6 kg 54.7 kg 54.7 kg    Examination: Constitutional: NAD, calm, comfortable, cachectic and frail. Eyes: PERRL, lids and conjunctivae normal ENMT: Mucous membranes are moist. Posterior pharynx clear of any exudate or lesions.Normal dentition.  Neck: normal, supple, no masses, no thyromegaly Respiratory: clear to auscultation bilaterally, no wheezing, no crackles. Normal respiratory effort. No accessory muscle use.  Cardiovascular: Regular rate and rhythm, no murmurs / rubs / gallops. No extremity edema. 2+ pedal pulses. No carotid bruits.  Abdomen: no tenderness, no masses palpated. No hepatosplenomegaly. Bowel sounds positive.  Musculoskeletal: no clubbing / cyanosis. No joint deformity upper and lower extremities. Good ROM, no contractures. Normal muscle tone.  Skin: no rashes, lesions, ulcers. No induration Neurologic: CN 2-12 grossly intact. Sensation intact, DTR normal. Strength 4+/5 in all 4.  Psychiatric: Normal judgment and insight. Alert and oriented x 3. Normal mood.     Data Reviewed:   CBC: Recent Labs  Lab 10/27/18 0830 10/28/18 0947 10/29/18 0608 10/30/18 0626 10/31/18 0503 11/01/18 0530  WBC 9.2 10.5 9.5 8.9 8.1 7.2  NEUTROABS 7.6  --  7.9* 7.4 6.4 5.7  HGB 8.5* 8.7* 8.4* 9.6* 8.1* 7.9*  HCT 28.0* 27.9* 27.5* 31.0* 25.9* 25.8*  MCV 89.7 90.9 88.1 89.1 89.9 90.2  PLT 319 354 341 414* 337 027   Basic Metabolic Panel: Recent Labs  Lab  10/27/18 0830 10/29/18 0608 10/30/18 0626 10/31/18 0503 11/01/18 0530  NA 135 134* 133* 135 137  K 4.1 3.7 3.6 3.3* 3.8  CL 100 99 98 99 101  CO2 29 28 26 28 27   GLUCOSE 237* 184* 124* 227* 184*  BUN  9 6 7  <5* <5*  CREATININE 0.39* 0.37* 0.41* 0.42* 0.42*  CALCIUM 7.7* 7.6* 8.0* 7.6* 7.8*  MG  --   --   --   --  1.7   GFR: Estimated Creatinine Clearance: 78.8 mL/min (A) (by C-G formula based on SCr of 0.42 mg/dL (L)). Liver Function Tests: Recent Labs  Lab 10/27/18 0830 10/29/18 0608 10/30/18 0626 10/31/18 0503 11/01/18 0530  AST 18 13* 13* 12* 12*  ALT 17 14 14 13 13   ALKPHOS 84 69 72 65 63  BILITOT 0.3 0.4 0.5 0.4 0.4  PROT 6.4* 6.0* 6.4* 6.1* 6.0*  ALBUMIN 1.5* 1.4* 1.4* 1.4* 1.4*   No results for input(s): LIPASE, AMYLASE in the last 168 hours. No results for input(s): AMMONIA in the last 168 hours. Coagulation Profile: No results for input(s): INR, PROTIME in the last 168 hours. Cardiac Enzymes: No results for input(s): CKTOTAL, CKMB, CKMBINDEX, TROPONINI in the last 168 hours. BNP (last 3 results) No results for input(s): PROBNP in the last 8760 hours. HbA1C: No results for input(s): HGBA1C in the last 72 hours. CBG: Recent Labs  Lab 10/31/18 1629 10/31/18 2012 10/31/18 2354 11/01/18 0323 11/01/18 0728  GLUCAP 117* 175* 186* 185* 178*   Lipid Profile: No results for input(s): CHOL, HDL, LDLCALC, TRIG, CHOLHDL, LDLDIRECT in the last 72 hours. Thyroid Function Tests: No results for input(s): TSH, T4TOTAL, FREET4, T3FREE, THYROIDAB in the last 72 hours. Anemia Panel: No results for input(s): VITAMINB12, FOLATE, FERRITIN, TIBC, IRON, RETICCTPCT in the last 72 hours. Sepsis Labs: No results for input(s): PROCALCITON, LATICACIDVEN in the last 168 hours.  No results found for this or any previous visit (from the past 240 hour(s)).       Radiology Studies: No results found.      Scheduled Meds: . atorvastatin  20 mg Oral QHS  . carvedilol  3.125 mg Oral BID WC  . dronabinol  5 mg Oral BID AC  . feeding supplement (ENSURE ENLIVE)  237 mL Oral TID BM  . fluconazole  100 mg Oral Daily  . gabapentin  400 mg Oral TID PC & HS  . insulin aspart  0-9  Units Subcutaneous Q4H  . insulin detemir  4 Units Subcutaneous Daily  . morphine  15 mg Oral Q12H   Continuous Infusions: . dextrose 5 % and 0.45% NaCl Stopped (10/30/18 1548)  . heparin 1,600 Units/hr (10/31/18 1500)     LOS: 59 days   Time spent= 25 mins    Ryan Medina Arsenio Loader, MD [facility]  If 7PM-7AM, please contact night-coverage [email or web address] 11/01/2018, 11:11 AM

## 2018-11-01 NOTE — Progress Notes (Signed)
PT Cancellation Note  Patient Details Name: Ryan Medina MRN: [id number] DOB: 08-Jul-1961   Cancelled Treatment:    Reason Eval/Treat Not Completed: Fatigue/lethargy limiting ability to participate - pt reports he just ambulated in hall, washed up in bathroom, and did all LE exercises. Pt reports fatigue, stating he wants to defer PT session until tomorrow. PT to check back tomorrow.   Julien Girt, PT Acute Rehabilitation Services Pager [phone]  Office [place] 11/01/2018, 1:02 PM

## 2018-11-01 NOTE — Anesthesia Postprocedure Evaluation (Signed)
Anesthesia Post Note  Patient: Macdonald Rigor Helmer  Procedure(s) Performed: ESOPHAGOGASTRODUODENOSCOPY (EGD) WITH PROPOFOL (N/A )     Patient location during evaluation: Endoscopy Anesthesia Type: MAC Level of consciousness: awake Pain management: pain level controlled Vital Signs Assessment: post-procedure vital signs reviewed and stable Respiratory status: spontaneous breathing Cardiovascular status: stable Postop Assessment: no apparent nausea or vomiting Anesthetic complications: no    Last Vitals:  Vitals:   11/01/18 0612 11/01/18 1355  BP: 119/89 (!) 131/95  Pulse: 84 86  Resp: 16 18  Temp: 37.2 C 37.1 C  SpO2: 99% 100%    Last Pain:  Vitals:   11/01/18 1355  TempSrc: Oral  PainSc:    Pain Goal: Patients Stated Pain Goal: 3 (10/25/18 1806)                 Huston Foley

## 2018-11-01 NOTE — Progress Notes (Addendum)
[place]eder for Heparin - no bolus Indication: PE  Allergies  Allergen Reactions  . Lisinopril Anaphylaxis    Patient Measurements: Height: 5\' 11"  (180.3 cm) Weight: 120 lb 9.6 oz (54.7 kg) IBW/kg (Calculated) : 75.3 Heparin Dosing Weight: 66.7  Vital Signs: Temp: 98.9 F (37.2 C) (02/10 0612) Temp Source: Oral (02/10 0612) BP: 119/89 (02/10 0612) Pulse Rate: 84 (02/10 0612)  Labs: Recent Labs    10/30/18 0626  10/31/18 0503 10/31/18 0723 11/01/18 0530  HGB 9.6*  --  8.1*  --  7.9*  HCT 31.0*  --  25.9*  --  25.8*  PLT 414*  --  337  --  365  HEPARINUNFRC 0.16*   < > 1.10* 0.73* 0.58  CREATININE 0.41*  --  0.42*  --  0.42*   < > = values in this interval not displayed.   Estimated Creatinine Clearance: 78.8 mL/min (A) (by C-G formula based on SCr of 0.42 mg/dL (L)).  Assessment: 58 yo male with arterial embolus s/p embolectomy. Patient had development of hematoma with compartment syndrome as well as hemorrhagic shock secondary to a bleeding duodenal ulcer.   Events this admission on 09/03/18:  LLE DVT/ischemia s/p urgent left popliteal artery embolectomy 12/13 >> RLE hematoma on 12/15 am s/p evacuation in OR.Heparin stopped 2/2 drop in hgb, hematemesis. -Heparin cautiously started 12/22,had few episodes of hematemesis & nose bleed 12/24> stopped heparin per dr. Candiss Norse S/p EUS 12/26:  pancreatic mass eroding into duodenum -VVS notes ikely too high risk for [facility]  12/28- Dr. Candiss Norse noted Continue PPI and Hold Heparin indefinitely  2/5: pharmacy consulted to dose heparin with no bolus by Dr Marin Olp for acute PE. Previously on 12/24 his heparin level was therapeutic at 0.35 on 1200 units/hr. 2.7: Heparin off at 09 am for EDG, heparin to resume after NG placed per GI instructions. KUB 1629> NG in stomach.   11/01/2018  heparin level therapeutic at 0.58 on 1600 units/hr. Hg 7.9, PLTC WNL, No bleeding reported. CCS consulted for possible  palliative bypass procedure for relief of gastric outlet obstruction. CCS rec gastrostomy tube and feeding jejunostomy.   Goal of Therapy:  Heparin level 0.3-0.5 units/mL Monitor platelets by anticoagulation protocol: Yes     Plan:  Continue Heparin infusion at 1600 units/hr  F/u plans for surgery - heparin will be held pre-op  Daily HL and CBC while on heparin  IV fluconazole>>PO, to stop 2/20 for 14 day course  Eudelia Bunch, Pharm.D [phone] 11/01/2018 8:24 AM

## 2018-11-02 ENCOUNTER — Ambulatory Visit: Payer: Self-pay

## 2018-11-02 DIAGNOSIS — Z01810 Encounter for preprocedural cardiovascular examination: Secondary | ICD-10-CM

## 2018-11-02 DIAGNOSIS — K311 Adult hypertrophic pyloric stenosis: Secondary | ICD-10-CM

## 2018-11-02 DIAGNOSIS — I420 Dilated cardiomyopathy: Secondary | ICD-10-CM

## 2018-11-02 LAB — CBC WITH DIFFERENTIAL/PLATELET
Abs Immature Granulocytes: 0.06 10*3/uL (ref 0.00–0.07)
Basophils Absolute: 0 10*3/uL (ref 0.0–0.1)
Basophils Relative: 0 %
Eosinophils Absolute: 0 10*3/uL (ref 0.0–0.5)
Eosinophils Relative: 0 %
HCT: 24.6 % — ABNORMAL LOW (ref 39.0–52.0)
Hemoglobin: 7.5 g/dL — ABNORMAL LOW (ref 13.0–17.0)
Immature Granulocytes: 1 %
Lymphocytes Relative: 12 %
Lymphs Abs: 0.9 10*3/uL (ref 0.7–4.0)
MCH: 27.2 pg (ref 26.0–34.0)
MCHC: 30.5 g/dL (ref 30.0–36.0)
MCV: 89.1 fL (ref 80.0–100.0)
Monocytes Absolute: 0.6 10*3/uL (ref 0.1–1.0)
Monocytes Relative: 8 %
Neutro Abs: 6.3 10*3/uL (ref 1.7–7.7)
Neutrophils Relative %: 79 %
Platelets: 320 10*3/uL (ref 150–400)
RBC: 2.76 MIL/uL — ABNORMAL LOW (ref 4.22–5.81)
RDW: 14.3 % (ref 11.5–15.5)
WBC: 7.9 10*3/uL (ref 4.0–10.5)
nRBC: 0 % (ref 0.0–0.2)

## 2018-11-02 LAB — COMPREHENSIVE METABOLIC PANEL
ALK PHOS: 64 U/L (ref 38–126)
ALT: 15 U/L (ref 0–44)
AST: 16 U/L (ref 15–41)
Albumin: 1.4 g/dL — ABNORMAL LOW (ref 3.5–5.0)
Anion gap: 9 (ref 5–15)
BUN: <5 mg/dL — ABNORMAL LOW (ref 6–20)
CALCIUM: 7.4 mg/dL — AB (ref 8.9–10.3)
CO2: 26 mmol/L (ref 22–32)
Chloride: 98 mmol/L (ref 98–111)
Creatinine, Ser: 0.36 mg/dL — ABNORMAL LOW (ref 0.61–1.24)
GFR calc Af Amer: >60 mL/min (ref >60)
GFR calc non Af Amer: >60 mL/min (ref >60)
Glucose, Bld: 205 mg/dL — ABNORMAL HIGH (ref 70–99)
Potassium: 3 mmol/L — ABNORMAL LOW (ref 3.5–5.1)
Sodium: 133 mmol/L — ABNORMAL LOW (ref 135–145)
Total Bilirubin: 0.4 mg/dL (ref 0.3–1.2)
Total Protein: 6 g/dL — ABNORMAL LOW (ref 6.5–8.1)

## 2018-11-02 LAB — GLUCOSE, CAPILLARY
Glucose-Capillary: 80 mg/dL (ref 70–99)
Glucose-Capillary: 169 mg/dL — ABNORMAL HIGH (ref 70–99)
Glucose-Capillary: 166 mg/dL — ABNORMAL HIGH (ref 70–99)
Glucose-Capillary: 147 mg/dL — ABNORMAL HIGH (ref 70–99)
Glucose-Capillary: 115 mg/dL — ABNORMAL HIGH (ref 70–99)
Glucose-Capillary: 149 mg/dL — ABNORMAL HIGH (ref 70–99)
Glucose-Capillary: 179 mg/dL — ABNORMAL HIGH (ref 70–99)

## 2018-11-02 LAB — PROTIME-INR
INR: 1.31
Prothrombin Time: 16.2 seconds — ABNORMAL HIGH (ref 11.4–15.2)

## 2018-11-02 LAB — PREPARE RBC (CROSSMATCH)

## 2018-11-02 LAB — HEPARIN LEVEL (UNFRACTIONATED)
Heparin Unfractionated: 1.42 IU/mL — ABNORMAL HIGH (ref 0.30–0.70)
Heparin Unfractionated: 0.47 IU/mL (ref 0.30–0.70)

## 2018-11-02 LAB — MAGNESIUM: Magnesium: 1.5 mg/dL — ABNORMAL LOW (ref 1.7–2.4)

## 2018-11-02 LAB — PREALBUMIN: Prealbumin: 5.1 mg/dL — ABNORMAL LOW (ref 18–38)

## 2018-11-02 MED ORDER — SODIUM CHLORIDE 0.9% IV SOLUTION
Freq: Once | INTRAVENOUS | Status: AC
  Administered 2018-11-02: 12:00:00 via INTRAVENOUS

## 2018-11-02 MED ORDER — FUROSEMIDE 10 MG/ML IJ SOLN
20.0000 mg | Freq: Once | INTRAMUSCULAR | Status: AC
  Administered 2018-11-02: 20 mg via INTRAVENOUS

## 2018-11-02 MED ORDER — POTASSIUM CHLORIDE 10 MEQ/100ML IV SOLN
10.0000 meq | INTRAVENOUS | Status: AC
  Administered 2018-11-02 (×4): 10 meq via INTRAVENOUS
  Filled 2018-11-02 (×4): qty 100

## 2018-11-02 MED ORDER — MAGNESIUM SULFATE 2 GM/50ML IV SOLN
2.0000 g | Freq: Once | INTRAVENOUS | Status: AC
  Administered 2018-11-02: 2 g via INTRAVENOUS
  Filled 2018-11-02: qty 50

## 2018-11-02 MED ORDER — ENSURE ENLIVE PO LIQD
237.0000 mL | Freq: Four times a day (QID) | ORAL | Status: DC
  Administered 2018-11-02 – 2018-11-03 (×6): 237 mL via ORAL

## 2018-11-02 MED ORDER — FUROSEMIDE 10 MG/ML IJ SOLN
20.0000 mg | Freq: Once | INTRAMUSCULAR | Status: DC
  Filled 2018-11-02: qty 2

## 2018-11-02 NOTE — Progress Notes (Signed)
[place] for Heparin - no bolus Indication: PE  Allergies  Allergen Reactions  . Lisinopril Anaphylaxis    Patient Measurements: Height: 5\' 11"  (180.3 cm) Weight: 120 lb 9.6 oz (54.7 kg) IBW/kg (Calculated) : 75.3 Heparin Dosing Weight: 66.7  Vital Signs: Temp: 98.8 F (37.1 C) (02/11 0506) Temp Source: Oral (02/11 0506) BP: 127/86 (02/11 0506) Pulse Rate: 86 (02/11 0506)  Labs: Recent Labs    10/31/18 0503  11/01/18 0530 11/01/18 0921 11/02/18 0451 11/02/18 0832  HGB 8.1*  --  7.9*  --  7.5*  --   HCT 25.9*  --  25.8*  --  24.6*  --   PLT 337  --  365  --  320  --   APTT  --   --   --  115*  --   --   LABPROT  --   --   --   --  16.2*  --   INR  --   --   --   --  1.31  --   HEPARINUNFRC 1.10*   < > 0.58  --  1.42* 0.47  CREATININE 0.42*  --  0.42*  --  0.36*  --    < > = values in this interval not displayed.   Estimated Creatinine Clearance: 78.8 mL/min (A) (by C-G formula based on SCr of 0.36 mg/dL (L)).  Assessment: 58 yo male with arterial embolus s/p embolectomy. Patient had development of hematoma with compartment syndrome as well as hemorrhagic shock secondary to a bleeding duodenal ulcer.   Events this admission on 09/03/18:  LLE DVT/ischemia s/p urgent left popliteal artery embolectomy 12/13 >> RLE hematoma on 12/15 am s/p evacuation in OR.Heparin stopped 2/2 drop in hgb, hematemesis. -Heparin cautiously started 12/22,had few episodes of hematemesis & nose bleed 12/24> stopped heparin per dr. Candiss Norse S/p EUS 12/26:  pancreatic mass eroding into duodenum -VVS notes ikely too high risk for [facility]  12/28- Dr. Candiss Norse noted Continue PPI and Hold Heparin indefinitely  2/5: pharmacy consulted to dose heparin with no bolus by Dr Marin Olp for acute PE. Previously on 12/24 his heparin level was therapeutic at 0.35 on 1200 units/hr. 2.7: Heparin off at 09 am for EDG, heparin to resume after NG placed per GI instructions. KUB 1629> NG  in stomach.   11/02/2018  heparin level therapeutic drawn at at  0451 = 1.42 - level elevated because HL drawn from [facility] where heparin is infusing, repeat HL drawn peripherally is theraeeutic at 0.47 on 1600 units/hr. Hg 7.5, PLTC WNL, No bleeding reported. CCS consulted for possible palliative bypass procedure for relief of gastric outlet obstruction. CCS rec gastrostomy tube and feeding jejunostomy.  INR 1.31. has been elevated entire admission, as high at 1.5 on 09/27/18. Not on coumadin.   Goal of Therapy:  Heparin level 0.3-0.5 units/mL Monitor platelets by anticoagulation protocol: Yes     Plan:  Continue Heparin infusion at 1600 units/hr  F/u plans for surgery - heparin will be held pre-op  Daily HL and CBC while on heparin  Eudelia Bunch, Pharm.D [phone] 11/02/2018 9:40 AM

## 2018-11-02 NOTE — Progress Notes (Signed)
PT Cancellation Note  Patient Details Name: Elray Dains Amey MRN: [id number] DOB: Dec 01, 1960   Cancelled Treatment:     HgB 7.5 and K+3.0    Will need to hold off for today for activity.  Pt is moving throughout his room as needed.  Will continue to follow for placement.   Rica Koyanagi  PTA Acute  Rehabilitation Services Pager      [phone] Office      [phone]

## 2018-11-02 NOTE — Progress Notes (Signed)
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 recorded yesterday.  His abdomen remains slightly distended but no better or worse than yesterday.  He seems comfortable.     Objective: Vital signs in last 24 hours: Temp:  [98.7 F (37.1 C)-99.1 F (37.3 C)] 98.8 F (37.1 C) (02/11 0506) Pulse Rate:  [86-95] 86 (02/11 0506) Resp:  [17-18] 17 (02/11 0506) BP: (127-131)/(86-98) 127/86 (02/11 0506) SpO2:  [100 %] 100 % (02/11 0506) Last BM Date: 11/01/18 1680 p.o. recorded 3025 urine No BM recorded Afebrile blood pressure somewhat elevated. Potassium is 3.0 glucose 205, magnesium 1.5 albumin 1.4 Pre-albumin 5.1 LFTs are stable WBC 7.9 H&H 7.5/24.6     Intake/Output from previous day: 02/10 0701 - 02/11 0700 In: 1680 [P.O.:1680] Out: 3225 [Urine:3225] Intake/Output this shift: Total I/O In: 240 [P.O.:240] Out: 450 [Urine:450]  General appearance: alert, cooperative and no distress Resp: clear to auscultation bilaterally GI: Soft, distended, he does not appear uncomfortable.  Tolerating clear liquids well.  Lab Results:  Recent Labs    11/01/18 0530 11/02/18 0451  WBC 7.2 7.9  HGB 7.9* 7.5*  HCT 25.8* 24.6*  PLT 365 320    BMET Recent Labs    11/01/18 0530 11/02/18 0451  NA 137 133*  K 3.8 3.0*  CL 101 98  CO2 27 26  GLUCOSE 184* 205*  BUN <5* <5*  CREATININE 0.42* 0.36*  CALCIUM 7.8* 7.4*   PT/INR Recent Labs    11/02/18 0451  LABPROT 16.2*  INR 1.31    Recent Labs  Lab 10/29/18 0608 10/30/18 0626 10/31/18 0503 11/01/18 0530 11/02/18 0451  AST 13* 13* 12* 12* 16  ALT 14 14 13 13 15   ALKPHOS 69 72 65 63 64  BILITOT 0.4 0.5 0.4 0.4 0.4  PROT 6.0* 6.4* 6.1* 6.0* 6.0*  ALBUMIN 1.4* 1.4* 1.4* 1.4* 1.4*     Lipase     Component Value Date/Time   LIPASE 42 08/22/2018 1932     Medications: . sodium chloride   Intravenous Once  . atorvastatin  20 mg Oral  QHS  . carvedilol  3.125 mg Oral BID WC  . dronabinol  5 mg Oral BID AC  . feeding supplement (ENSURE ENLIVE)  237 mL Oral TID BM  . fluconazole  100 mg Oral Daily  . furosemide  20 mg Intravenous Once  . furosemide  20 mg Intravenous Once  . gabapentin  400 mg Oral TID PC & HS  . insulin aspart  0-9 Units Subcutaneous Q4H  . insulin detemir  4 Units Subcutaneous Daily  . morphine  15 mg Oral Q12H  FINE NEEDLE ASPIRATION, ENDOSCOPIC PANCREATIC NECK MASS (SPECIMEN 1 OF 1 COLLECTED 09/06/18) - MALIGNANT CELLS CONSISTENT WITH ADENOCARCINOMA  Assessment/Plan Hx popliteal artery embolism Chronic bilateral pulmonary embolisms 10/27/18 Hx HCAP/Pseudomonas UTI Hx ischemic right popliteal artery/tibial artery embolism with fasciotomy 09/03/18 - ongoing ischemic pain - uses walker Cardiomyopathy, Hx combined systolic diastolic CHF -EF 45 to 92%, grade 1 diastolic dysfunction, trivial AR, mild MR/TR Uncontrolled diabetes -hemoglobin A1c 18.2 -glucose controlled in hospital Severe protein calorie malnutrition 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 Chronic pain/hx of substance abuse -ETOH Anemia H/H:  7.9/25.8 Hx exploratory laparotomy,closure of pyloric channel ulcer with Phillip Heal patch 12/2003 Dr. Harlow Asa Cocaine positive 09/27/18 Hypokalemia/hypomagnesemia Severe malnutrition -prealbumin 5.1   Hemorrhagic shock secondary to duodenal ulcer- GI bleed/embolization 09/07/2018 with radiation therapy/chemotherapy pending Gastric  outlet obstruction secondary to advanced pancreatic adenocarcinoma with liver metastasis  FEN:IV fluids/clear liquids ID:  Diflucan 2/7>> day 4 DVT:  Heparin drip Follow up:  TBD  Plan: I will review Dr. Antonieta Pert recommendations with Dr. Hassell Done.  Awaiting calorie count and will discuss nutritional status with Dr. Hassell Done.  He has severe malnutrition.  Will need Dr. Latina Craver medical recommendations on surgery, as well as cardiology's  recommendations.  He has a greater than 20-year history use of cocaine and was positive on admission.       LOS: 60 days    Mariene Dickerman 11/02/2018 [phone]

## 2018-11-02 NOTE — Progress Notes (Signed)
Still waiting for surgery.  He MUST have surgery if we really have any hope of him having a quality of life.  I do not see any problems with him having surgery from a hematologic point of view.  I know he is on the heparin.  This can be stopped right before surgery and restarted after surgery.  He really needs to have better nutrition.  I know he is taking some clear liquids and.  Maybe TNA could be started on him while we are awaiting surgery.  He needs to be transfused 2 units.  His hemoglobin is 7.5.  His potassium is 3.0.  His blood sugars are still on the high side.  He is really not having too much in the way of pain.  He is out of bed a little bit.  Again, I just do not see any other way of trying to help him outside of surgery.  It would be very difficult for [place] to give him chemotherapy if he really is not able to take in adequate nutrition.  I do appreciate everybody's help with Ryan Medina.  I know this is a very complex situation.  I know there is no easy answer.  I know that what we are dealing with is likely not curable but we can at least treat with systemic chemotherapy and get a decent response.  Ryan Haw, MD  1 Corinthians 13:13

## 2018-11-02 NOTE — Progress Notes (Addendum)
Nutrition Follow-up  DOCUMENTATION CODES:   Severe malnutrition in context of chronic illness, Underweight  INTERVENTION:   -Will increase Ensure Supplements to QID to provide 1400 kcal and 80g protein -Will order new weight measurement  NUTRITION DIAGNOSIS:   Severe Malnutrition related to chronic illness(Pancreatic cancer, DM and CHF) as evidenced by severe muscle depletion, severe fat depletion, percent weight loss.  Ongoing.  GOAL:   Patient will meet greater than or equal to 90% of their needs  Progressing.  MONITOR:   PO intake, Supplement acceptance, Weight trends, Labs  REASON FOR ASSESSMENT:   Consult Assessment of nutrition requirement/status, Calorie Count  ASSESSMENT:   58 y.o male with PMH: recent dx pancreatic cancer, DM, CHF, crack cocaine use, emboli of legs, smoker. Pt admitted with admitted with DKA and GI bleed.   Significant events at [facility]: 12/13 embolectomy surgery for acute popliteal and tibial emboli 12/15 fasciotomy for RLE hematoma  12/17 Developed hemorrhagic shock secondary to bleeding duodenal ulcer requiring transfer to ICU for intubation 12/22 MRI showed pancreatic mass 12/26 Endoscopic ultrasound by Gi showed adenocarcinoma 1/5- Transferred back to ICU with hypotension anemia and GI bleed from invading pancreatic mass  [facility]: 1/7- Transferred to [facility] for radiation 1/22 -completed course of radiation 2/5: CT scan reveals enlargement of pancreatic mass, dilated stomach which is fluid and food filled 2/7 s/p EGD -tumor occluding the gastric outlet at the level of the pylorus.  2/8 -NGT pulled out, started on CLD  Per surgery notes, pt is being considered for palliative bypass procedure and G-J tube placement. Calorie Count was ordered but patient on clear liquids. Spoke with RN, states pt is very hungry and is consuming 100% of all clear trays and he is drinking Ensure supplements and tolerating. Pt does not like Boost Breeze  supplements.  On 2/10: pt consumed 3 Ensure supplements and 1 Boost Breeze supplements:  Provided 1300 kcal (65% of estimated needs) 69g protein (69% of estimated needs) Could not calculate calories from other clear liquids as pt did not order meal trays (they are not documented in [facility] system).   No new weight has been measured. Weight from 2/7 is the exact same measurement from 1/20.   Medications: IV Lasix once, KCl infusion, Mg sulfate infusion  Labs reviewed: CBGs: 115-147 Low Na, K, Mg  Diet Order:   Diet Order            Diet clear liquid Room service appropriate? Yes; Fluid consistency: Thin  Diet effective now              EDUCATION NEEDS:   Not appropriate for education at this time  Skin:  Skin Assessment: Reviewed RN Assessment  Last BM:  2/10  Height:   Ht Readings from Last 1 Encounters:  10/29/18 5\' 11"  (1.803 m)    Weight:   Wt Readings from Last 1 Encounters:  10/29/18 54.7 kg    Ideal Body Weight:  78 kg  BMI:  Body mass index is 16.82 kg/m.  Estimated Nutritional Needs:   Kcal:  2000-2200 kcal  Protein:  100-110 grams  Fluid:  >/= 2L/day  Clayton Bibles, MS, RD, LDN [facility] Inpatient Clinical Dietitian Pager: [phone] After Hours Pager: [phone]

## 2018-11-02 NOTE — Progress Notes (Signed)
PROGRESS NOTE    Ryan Medina  HYW:[id number] DOB: [birth year] DOA: 09/03/2018 PCP: Patient, No Pcp Per   Brief Narrative:   58 year old with history of peripheral vascular disease, dilated cardiomyopathy, essential hypertension, diabetes mellitus type 2 initially came to the hospital with right lower extremity pain and was found to have ischemic leg requiring embolectomy on 12/13.  He was placed on heparin drip subsequently developed right lower extremity hematoma requiring fasciotomy on 12/15.  Unfortunately afterwards he decompensated with active GI bleed and was intubated and was moved to the ICU.  He required embolization on 12/17.  During the course of his hospitalization he is required total of 26 units of blood.  Further evaluation showed pancreatic cancer with duodenal ulcer.  Transferred to [place] [facility] for radiation treatment to help with bleeding.  Hospital course complicated by sepsis from healthcare acquired pneumonia and Pseudomonas UTI requiring treatment with vancomycin and cefepime.  Patient found to have chronic PE therefore started on heparin drip per oncology.  Repeat endoscopy showed worsening of gastric outlet obstruction.  Initially NG tube placed by patient removed it.  Right now on clear liquid diet.  Assessment & Plan:   Principal Problem:   Popliteal artery embolus (HCC) Active Problems:   DM (diabetes mellitus), type 2, uncontrolled ([place])   Essential hypertension   Dilated cardiomyopathy (HCC)   Chest pain   AAA (abdominal aortic aneurysm) (HCC)   Ischemic foot   Right leg claudication (HCC)   PVD (peripheral vascular disease) ([place])   Hemorrhagic shock ([place])   Nontraumatic ischemic infarction of muscle of right lower leg   Upper GI bleed   Cocaine abuse ([place])   Acute blood loss anemia   Chronic pain syndrome   Duodenal ulcer with hemorrhage   Pancreatic cancer ([place])   Goals of care, counseling/discussion   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  Status post radiation.  Per oncology plans for outpatient radiation and chemotherapy -Status post endoscopy 12/17, GDA embolization 12/17, status post radiation. - Continue PPI. - Endoscopy showed worsening of gastric outlet obstruction/pyloric stenosis secondary to malignancy.  Candida esophagitis - General surgery is evaluating the patient for GJ tube.  At this point oncology wants to try chemotherapy and monitor response.  I am not sure how helpful will this be and also how will affect his lifestyle as it will worsen his weakness.  After discussion with the patient and his sister, it appears that they do want to try as best as possible.  I suspect surgically this will be a difficult case. - Notified cardiology team to see if any further work-up is necessary in case if we decide for tube placement.  Candida esophagitis -Currently on Diflucan for total of 2 weeks.  Day 4 today.  Chronic bilateral pulmonary embolism -Heparin drip per oncology  Anemia of chronic disease - No obvious signs of blood loss noted.  Plans for 2 units of PRBC transfusion today.  Hypokalemia/hypomagnesemia -  Repletion ordered.  Sepsis secondary to H CAP and Pseudomonas urinary tract infection -Status post treatment with vancomycin and cefepime.  PICC line pulled out 1/26.  Ischemic right foot with right popliteal and tibial  artery embolus -Status post embolectomy.  Complicated by compartment syndrome requiring fasciotomy. -Low-dose heparin started.  Closely monitor for any bleeding. -Lower extremity routine dressing, follow-up outpatient with vascular  Cardiomyopathy, combined systolic and diastolic CHF, chronic -Patient appears to be euvolemic in nature.  Continue to provide supportive care.  Uncontrolled diabetes mellitus type 2 -Hemoglobin A1c 18.2.    Continue Lantus, Accu-Chek and sliding scale.  Blood glucose in acceptable range  Severe protein calorie malnutrition - Protein supplement ordered  Generalized weakness-physical therapy/Occupational Therapy-recommending skilled nursing facility  DVT prophylaxis:  Heparin drip Code Status: Okay for intubation, no CPR.  Partial code Family Communication:  Brother and sister at bedside Disposition Plan:  Maintain inpatient stay.  Consultants:   Gastroenterology  Oncology  Interventional radiology  Critical care  Vascular surgery  Palliative care  General surgery  Cardiology  Procedures:   Endoscopy 12/17  EUS 12/26  Chemo-Port placement January 21  Repeat endoscopy 2/7  Antimicrobials:   Completed course of vancomycin and cefepime   Subjective: Had an extensive discussion with the patient and his sister at bedside.  He denies any complaints at this time.  He is upset that he has to be on liquid diet for now but he is not really have any other option and he understands this.  He would like trial of chemotherapy once his nutritional status is better to see if it helps him.  Review of Systems Otherwise negative except as per HPI, including: General = no fevers, chills, dizziness, malaise, fatigue HEENT/EYES = negative for pain, redness, loss of vision, double vision, blurred vision, loss of hearing, sore throat, hoarseness, dysphagia Cardiovascular= negative for chest pain, palpitation, murmurs, lower extremity  swelling Respiratory/lungs= negative for shortness of breath, cough, hemoptysis, wheezing, mucus production Gastrointestinal= negative for nausea, vomiting,, abdominal pain, melena, hematemesis Genitourinary= negative for Dysuria, Hematuria, Change in Urinary Frequency MSK = Negative for arthralgia, myalgias, Back Pain, Joint swelling  Neurology= Negative for headache, seizures, numbness, tingling  Psychiatry= Negative for anxiety, depression, suicidal and homocidal ideation Allergy/Immunology= Medication/Food allergy as listed  Skin= Negative for Rash, lesions, ulcers, itching  Objective: Vitals:   11/01/18 0612 11/01/18 1355 11/01/18 2033 11/02/18 0506  BP: 119/89 (!) 131/95 (!) 130/98 127/86  Pulse: 84 86 95 86  Resp: 16 18 17 17   Temp: 98.9 F (37.2 C) 98.7 F (37.1 C) 99.1 F (37.3 C) 98.8 F (37.1 C)  TempSrc: Oral Oral Oral Oral  SpO2: 99% 100% 100% 100%  Weight:      Height:        Intake/Output Summary (Last 24 hours) at 11/02/2018 1058 Last data filed at 11/02/2018 1044 Gross per 24 hour  Intake 1680 ml  Output 3600 ml  Net -1920 ml   Filed Weights   09/28/18 1914 10/11/18 1057 10/29/18 1219  Weight: 57.6 kg 54.7 kg 54.7 kg    Examination: Constitutional: NAD, calm, comfortable, cachectic frail with bilateral temporal wasting Eyes: PERRL, lids and conjunctivae normal ENMT: Mucous membranes are moist. Posterior pharynx clear of any exudate or lesions.Normal dentition.  Neck: normal, supple, no masses, no thyromegaly Respiratory: clear to auscultation bilaterally, no wheezing, no crackles. Normal respiratory effort. No accessory muscle use.  Cardiovascular: Regular rate and rhythm, no murmurs / rubs / gallops. No extremity edema. 2+  pedal pulses. No carotid bruits.  Abdomen: no tenderness, no masses palpated. No hepatosplenomegaly. Bowel sounds positive.  Musculoskeletal: no clubbing / cyanosis. No joint deformity upper and lower extremities. Good ROM, no  contractures. Normal muscle tone.  Skin: Surgical scar noted on his abdomen and also right lower extremity. Neurologic: CN 2-12 grossly intact. Sensation intact, DTR normal. Strength 4/5 in all 4.  Psychiatric: Normal judgment and insight. Alert and oriented x 3. Normal mood.    Data Reviewed:   CBC: Recent Labs  Lab 10/29/18 0608 10/30/18 0626 10/31/18 0503 11/01/18 0530 11/02/18 0451  WBC 9.5 8.9 8.1 7.2 7.9  NEUTROABS 7.9* 7.4 6.4 5.7 6.3  HGB 8.4* 9.6* 8.1* 7.9* 7.5*  HCT 27.5* 31.0* 25.9* 25.8* 24.6*  MCV 88.1 89.1 89.9 90.2 89.1  PLT 341 414* 337 365 623   Basic Metabolic Panel: Recent Labs  Lab 10/29/18 0608 10/30/18 0626 10/31/18 0503 11/01/18 0530 11/02/18 0451  NA 134* 133* 135 137 133*  K 3.7 3.6 3.3* 3.8 3.0*  CL 99 98 99 101 98  CO2 28 26 28 27 26   GLUCOSE 184* 124* 227* 184* 205*  BUN 6 7 <5* <5* <5*  CREATININE 0.37* 0.41* 0.42* 0.42* 0.36*  CALCIUM 7.6* 8.0* 7.6* 7.8* 7.4*  MG  --   --   --  1.7 1.5*   GFR: Estimated Creatinine Clearance: 78.8 mL/min (A) (by C-G formula based on SCr of 0.36 mg/dL (L)). Liver Function Tests: Recent Labs  Lab 10/29/18 0608 10/30/18 0626 10/31/18 0503 11/01/18 0530 11/02/18 0451  AST 13* 13* 12* 12* 16  ALT 14 14 13 13 15   ALKPHOS 69 72 65 63 64  BILITOT 0.4 0.5 0.4 0.4 0.4  PROT 6.0* 6.4* 6.1* 6.0* 6.0*  ALBUMIN 1.4* 1.4* 1.4* 1.4* 1.4*   No results for input(s): LIPASE, AMYLASE in the last 168 hours. No results for input(s): AMMONIA in the last 168 hours. Coagulation Profile: Recent Labs  Lab 11/02/18 0451  INR 1.31   Cardiac Enzymes: No results for input(s): CKTOTAL, CKMB, CKMBINDEX, TROPONINI in the last 168 hours. BNP (last 3 results) No results for input(s): PROBNP in the last 8760 hours. HbA1C: No results for input(s): HGBA1C in the last 72 hours. CBG: Recent Labs  Lab 11/01/18 1554 11/01/18 2003 11/01/18 2310 11/02/18 0403 11/02/18 0721  GLUCAP 146* 80 169* 166* 147*   Lipid  Profile: No results for input(s): CHOL, HDL, LDLCALC, TRIG, CHOLHDL, LDLDIRECT in the last 72 hours. Thyroid Function Tests: No results for input(s): TSH, T4TOTAL, FREET4, T3FREE, THYROIDAB in the last 72 hours. Anemia Panel: No results for input(s): VITAMINB12, FOLATE, FERRITIN, TIBC, IRON, RETICCTPCT in the last 72 hours. Sepsis Labs: No results for input(s): PROCALCITON, LATICACIDVEN in the last 168 hours.  No results found for this or any previous visit (from the past 240 hour(s)).       Radiology Studies: No results found.      Scheduled Meds: . sodium chloride   Intravenous Once  . atorvastatin  20 mg Oral QHS  . carvedilol  3.125 mg Oral BID WC  . dronabinol  5 mg Oral BID AC  . feeding supplement (ENSURE ENLIVE)  237 mL Oral TID BM  . fluconazole  100 mg Oral Daily  . furosemide  20 mg Intravenous Once  . furosemide  20 mg Intravenous Once  . gabapentin  400 mg Oral TID PC & HS  . insulin aspart  0-9 Units Subcutaneous Q4H  . insulin detemir  4 Units Subcutaneous Daily  . morphine  15 mg Oral Q12H   Continuous Infusions: . dextrose 5 % and 0.45% NaCl 75 mL/hr at 11/01/18 2344  . heparin 1,600 Units/hr (11/01/18 2343)  . potassium chloride 10 mEq (11/02/18 0951)     LOS: 60 days   Time spent= 25 mins    Rannie Craney Arsenio Loader, MD [facility]  If 7PM-7AM, please contact night-coverage [email or web address] 11/02/2018, 10:58 AM

## 2018-11-02 NOTE — Consult Note (Signed)
Cardiology Consultation:   Patient ID: Ryan Medina MRN: [id number]; DOB: 12-03-1960  Admit date: 09/03/2018 Date of Consult: 11/02/2018  Primary Care Provider: Patient, No Pcp Per Primary Cardiologist: Fransico Him, MD  Primary Electrophysiologist:  None    Patient Profile:   Ryan Medina is a 58 y.o. male with a hx of HTN, HLD, DM, with high risk nuclear stress test in 2017 and subsequent heart cath that showed no significant CAD and normal EF, COPD, significant cocaine abuse, and homelessness who is being seen today for the evaluation of preoperative risk evaluation at the request of Dr. Reesa Chew.  History of Present Illness:   Ryan Medina has the above PMH. He was last seen in clinic by Ryan Medina in 10/2016. He was following up after a high risk nuclear stress test was performed in 2017 with follow up [place] that demonstrated no significant CAD and normal EF. He continued to describe chest pain, DOE, and diaphoresis. Given his DM, it was thought that his chest pain was due to microvascular disease and he was placed on long-acting nitrate.   In Dec 2019, pt was found to have an ischemic leg requiring embolectomy. Heparin drip resulted in right lower extremity hematoma requiring fasciotomy. Unfortunately, he developed a GI bleed and required 26 U of blood for stabilization. Through this workup, he was found to have pancreatic cancer with duodenal ulcer. He underwent radiation treatment to help with the bleeding. This was complicated by sepsis from HCAP and pseudomonas UTI. He developed a PE and was once again started on heparin drip by oncology. Endoscopy showed worsening gastric outlet obstruction requiring NG tube. Oncology and Surgery teams are following. Oncology has recommended surgical intervention for gastric outlet obstruction. Surgery has consulted cardiology for cardiac risk assessment.   On my interview, patient denies anginal symptoms and symptoms of heart failure. We had a long  discussion about his cardiac health and risk for major surgery.   Past Medical History:  Diagnosis Date  . Back pain, chronic   . Diabetes ([place])   . Dilated cardiomyopathy ([place])    Echo 2/18: EF 45-50, diff HK, Gr 1 DD, trivial AI/TR  . Goals of care, counseling/discussion 09/24/2018  . HLD (hyperlipidemia)   . Hypertension   . IDDM (insulin dependent diabetes mellitus) ([place])   . Ruptured lumbar disc     Past Surgical History:  Procedure Laterality Date  . ABDOMINAL AORTOGRAM W/LOWER EXTREMITY Right 09/03/2018   Procedure: ABDOMINAL AORTOGRAM W/LOWER EXTREMITY;  Surgeon: Angelia Mould, MD;  Location: [place] [facility];  Service: Cardiovascular;  Laterality: Right;  . ABDOMINAL SURGERY    . APPENDECTOMY    . APPLICATION OF WOUND VAC Right 09/05/2018   Procedure: APPLICATION OF WOUND VAC RIGHT LOWER MEDIAL AND LATERAL FASCIOTOMY SITE;  Surgeon: Angelia Mould, MD;  Location: [place];  Service: Vascular;  Laterality: Right;  . BACK SURGERY    . BIOPSY  09/16/2018   Procedure: BIOPSY;  Surgeon: Milus Banister, MD;  Location: [place];  Service: Endoscopy;;  . CARDIAC CATHETERIZATION N/A 05/19/2016   Procedure: Left Heart Cath and Coronary Angiography;  Surgeon: Leonie Man, MD;  Location: [place] [facility];  Service: Cardiovascular;  Laterality: N/A;  . EMBOLECTOMY Right 09/03/2018   Procedure: Embolectomy Right Popliteal and Tibial Artery, Bovine Pericardium Patch Angioplasty Right Popliteal Artery;  Surgeon: Angelia Mould, MD;  Location: [place];  Service: Vascular;  Laterality: Right;  . EMBOLECTOMY Right 09/10/2018  Procedure: EMBOLECTOMY RIGHT LOWER EXTREMITY;  Surgeon: Angelia Mould, MD;  Location: [facility] OR;  Service: Vascular;  Laterality: Right;  . ESOPHAGOGASTRODUODENOSCOPY N/A 09/07/2018   Procedure: ESOPHAGOGASTRODUODENOSCOPY (EGD);  Surgeon: Irving Copas., MD;  Location: [place];  Service: Gastroenterology;  Laterality:  N/A;  . ESOPHAGOGASTRODUODENOSCOPY (EGD) WITH PROPOFOL N/A 09/16/2018   Procedure: ESOPHAGOGASTRODUODENOSCOPY (EGD) WITH PROPOFOL;  Surgeon: Milus Banister, MD;  Location: [facility] ENDOSCOPY;  Service: Endoscopy;  Laterality: N/A;  . ESOPHAGOGASTRODUODENOSCOPY (EGD) WITH PROPOFOL N/A 10/29/2018   Procedure: ESOPHAGOGASTRODUODENOSCOPY (EGD) WITH PROPOFOL;  Surgeon: Doran Stabler, MD;  Location: [facility] ENDOSCOPY;  Service: Gastroenterology;  Laterality: N/A;  . EUS N/A 09/16/2018   Procedure: ESOPHAGEAL ENDOSCOPIC ULTRASOUND (EUS) RADIAL;  Surgeon: Milus Banister, MD;  Location: [facility] ENDOSCOPY;  Service: Endoscopy;  Laterality: N/A;  . FASCIOTOMY Right 09/05/2018   Procedure: FOUR COMPARTMENT FASCIOTOMY RIGHT LOWER LEG ;  Surgeon: Angelia Mould, MD;  Location: [place];  Service: Vascular;  Laterality: Right;  . FASCIOTOMY CLOSURE Right 09/10/2018   Procedure: FASCIOTOMY CLOSURE FOUR COMPARTMENT;  Surgeon: Angelia Mould, MD;  Location: [place];  Service: Vascular;  Laterality: Right;  . FINE NEEDLE ASPIRATION  09/16/2018   Procedure: FINE NEEDLE ASPIRATION (FNA) LINEAR;  Surgeon: Milus Banister, MD;  Location: [facility] ENDOSCOPY;  Service: Endoscopy;;  . HEMATOMA EVACUATION Right 09/05/2018   Procedure: EVACUATION HEMATOMA OF RIGHT LOWER EXTREMITY; EVACUATION OF LYMPHOCELE RIGHT LOWER LEG ;  Surgeon: Angelia Mould, MD;  Location: [place];  Service: Vascular;  Laterality: Right;  . IR ANGIOGRAM SELECTIVE EACH ADDITIONAL VESSEL  09/07/2018  . IR ANGIOGRAM SELECTIVE EACH ADDITIONAL VESSEL  09/07/2018  . IR ANGIOGRAM VISCERAL SELECTIVE  09/07/2018  . IR ANGIOGRAM VISCERAL SELECTIVE  09/07/2018  . IR EMBO ART  VEN HEMORR LYMPH EXTRAV  INC GUIDE ROADMAPPING  09/07/2018  . IR IMAGING GUIDED PORT INSERTION  10/12/2018  . IR US GUIDE VASC ACCESS RIGHT  09/07/2018  . TEE WITHOUT CARDIOVERSION N/A 09/16/2018   Procedure: TRANSESOPHAGEAL ECHOCARDIOGRAM (TEE);  Surgeon: Milus Banister, MD;   Location: [facility] ENDOSCOPY;  Service: Endoscopy;  Laterality: N/A;  if cleared by GI     Home Medications:  Prior to Admission medications   Medication Sig Start Date End Date Taking? Authorizing Provider  amLODipine (NORVASC) 10 MG tablet Take 1 tablet (10 mg total) by mouth daily. 08/25/18  Yes Ghimire, Henreitta Leber, MD  atorvastatin (LIPITOR) 20 MG tablet Take 1 tablet (20 mg total) by mouth daily. 08/25/18  Yes Ghimire, Henreitta Leber, MD  cyclobenzaprine (FLEXERIL) 10 MG tablet Take 1 tablet (10 mg total) by mouth 2 (two) times daily as needed for muscle spasms. 08/25/18  Yes Ghimire, Henreitta Leber, MD  gabapentin (NEURONTIN) 300 MG capsule Take 1 capsule (300 mg total) by mouth 3 (three) times daily. 08/25/18  Yes Ghimire, Henreitta Leber, MD  insulin aspart (NOVOLOG) 100 UNIT/ML injection Inject 0-15 Units into the skin 3 (three) times daily with meals. 0-15 Units, Subcutaneous, 3 times daily with meals CBG < 70: implement hypoglycemia protocol CBG 70 - 120: 0 units CBG 121 - 150: 2 units CBG 151 - 200: 3 units CBG 201 - 250: 5 units CBG 251 - 300: 8 units CBG 301 - 350: 11 units CBG 351 - 400: 15 units CBG > 400: call MD 08/25/18  Yes Ghimire, Henreitta Leber, MD  insulin detemir (LEVEMIR) 100 UNIT/ML injection Inject 0.3 mLs (30 Units total) into the skin 2 (two) times daily.  Patient taking differently: Inject 20-30 Units into the skin See admin instructions. Take 30 units in the morning, and 20 units in the evening 08/25/18  Yes Ghimire, Henreitta Leber, MD  metFORMIN (GLUCOPHAGE) 1000 MG tablet Take 1 tablet (1,000 mg total) by mouth 2 (two) times daily with a meal. 08/25/18  Yes Ghimire, Henreitta Leber, MD  nitroGLYCERIN (NITROSTAT) 0.4 MG SL tablet Place 1 tablet (0.4 mg total) under the tongue every 5 (five) minutes as needed for chest pain. 08/25/18  Yes Ghimire, Henreitta Leber, MD  dicyclomine (BENTYL) 20 MG tablet Take 1 tablet (20 mg total) by mouth 2 (two) times daily. Patient not taking: Reported on 09/05/2018 08/25/18    Jonetta Osgood, MD  glucose blood (AGAMATRIX PRESTO TEST) test strip Check blood sugars prior to meals and at bedtime 08/25/18   Jonetta Osgood, MD    Inpatient Medications: Scheduled Meds: . sodium chloride   Intravenous Once  . atorvastatin  20 mg Oral QHS  . carvedilol  3.125 mg Oral BID WC  . dronabinol  5 mg Oral BID AC  . feeding supplement (ENSURE ENLIVE)  237 mL Oral TID BM  . fluconazole  100 mg Oral Daily  . furosemide  20 mg Intravenous Once  . furosemide  20 mg Intravenous Once  . gabapentin  400 mg Oral TID PC & HS  . insulin aspart  0-9 Units Subcutaneous Q4H  . insulin detemir  4 Units Subcutaneous Daily  . morphine  15 mg Oral Q12H   Continuous Infusions: . dextrose 5 % and 0.45% NaCl 75 mL/hr at 11/01/18 2344  . heparin 1,600 Units/hr (11/01/18 2343)  . magnesium sulfate 1 - 4 g bolus IVPB 2 g (11/02/18 0814)  . potassium chloride     PRN Meds: acetaminophen, alum & mag hydroxide-simeth, iohexol, ipratropium-albuterol, LORazepam, morphine injection, ondansetron, oxyCODONE, polyethylene glycol  Allergies:    Allergies  Allergen Reactions  . Lisinopril Anaphylaxis    Social History:   Social History   Socioeconomic History  . Marital status: Single    Spouse name: Not on file  . Number of children: Not on file  . Years of education: Not on file  . Highest education level: Not on file  Occupational History  . Not on file  Social Needs  . Financial resource strain: Not on file  . Food insecurity:    Worry: Not on file    Inability: Not on file  . Transportation needs:    Medical: Not on file    Non-medical: Not on file  Tobacco Use  . Smoking status: Current Every Day Smoker    Packs/day: 0.25    Years: 25.00    Pack years: 6.25    Types: Cigarettes  . Smokeless tobacco: Never Used  . Tobacco comment: 2 per day   Substance and Sexual Activity  . Alcohol use: No    Frequency: Never  . Drug use: No  . Sexual activity: Yes  Lifestyle    . Physical activity:    Days per week: Not on file    Minutes per session: Not on file  . Stress: Not on file  Relationships  . Social connections:    Talks on phone: Not on file    Gets together: Not on file    Attends religious service: Not on file    Active member of club or organization: Not on file    Attends meetings of clubs or organizations: Not on file  Relationship status: Not on file  . Intimate partner violence:    Fear of current or ex partner: Not on file    Emotionally abused: Not on file    Physically abused: Not on file    Forced sexual activity: Not on file  Other Topics Concern  . Not on file  Social History Narrative  . Not on file    Family History:    Family History  Problem Relation Age of Onset  . Diabetes Mother   . Heart failure Mother   . Diabetes Sister   . Diabetes Brother      ROS:  Please see the history of present illness.   All other ROS reviewed and negative.     Physical Exam/Data:   Vitals:   11/01/18 0612 11/01/18 1355 11/01/18 2033 11/02/18 0506  BP: 119/89 (!) 131/95 (!) 130/98 127/86  Pulse: 84 86 95 86  Resp: 16 18 17 17   Temp: 98.9 F (37.2 C) 98.7 F (37.1 C) 99.1 F (37.3 C) 98.8 F (37.1 C)  TempSrc: Oral Oral Oral Oral  SpO2: 99% 100% 100% 100%  Weight:      Height:        Intake/Output Summary (Last 24 hours) at 11/02/2018 0904 Last data filed at 11/02/2018 2979 Gross per 24 hour  Intake 1680 ml  Output 3450 ml  Net -1770 ml   Last 3 Weights 10/29/2018 10/11/2018 09/28/2018  Weight (lbs) 120 lb 9.6 oz 120 lb 9.6 oz 126 lb 15.8 oz  Weight (kg) 54.704 kg 54.704 kg 57.6 kg     Body mass index is 16.82 kg/m.  General:  Thin male, in no acute distress Neck: no JVD Vascular: No carotid bruits Cardiac:  normal S1, S2; RRR; no murmur  Lungs:  clear to auscultation bilaterally, no wheezing, rhonchi or rales  Abd: soft, nontender, no hepatomegaly  Ext: right ankle wrapped Musculoskeletal:  No deformities, BUE  and BLE strength normal and equal Skin: warm and dry  Neuro:  CNs 2-12 intact, no focal abnormalities noted Psych:  Normal affect   EKG:  The EKG was personally reviewed and demonstrates:  No new tracings Telemetry:  Telemetry was personally reviewed and demonstrates:  N/A  Relevant CV Studies:  Echo 09/04/18: Study Conclusions - Left ventricle: Wall thickness was increased in a pattern of mild   LVH. Systolic function was mildly reduced. The estimated ejection   fraction was in the range of 45% to 50%. Wall motion was normal;   there were no regional wall motion abnormalities. Doppler   parameters are consistent with abnormal left ventricular   relaxation (grade 1 diastolic dysfunction). - Aortic valve: There was trivial regurgitation. - Mitral valve: Mildly calcified annulus. There was mild   regurgitation. - Right atrium: Central venous pressure (est): 3 mm Hg. - Tricuspid valve: There was mild regurgitation. - Pulmonary arteries: Systolic pressure could not be accurately   estimated. - Pericardium, extracardiac: There was no pericardial effusion.  LHC 8/17 RCA mid 43 EF 55-65 No angiographic evidence of any significant disease to cause a high risk stress test with inferior ischemia. There is streaming of flow down the very large caliber RCA, and may be a 40% stenosis but no more than that.  Myoview 8/17 EF 36, fixed large inferior defect with peri-infarct ischemia; high risk  Laboratory Data:  Chemistry Recent Labs  Lab 10/31/18 0503 11/01/18 0530 11/02/18 0451  NA 135 137 133*  K 3.3* 3.8 3.0*  CL 99 101  98  CO2 28 27 26   GLUCOSE 227* 184* 205*  BUN <5* <5* <5*  CREATININE 0.42* 0.42* 0.36*  CALCIUM 7.6* 7.8* 7.4*  GFRNONAA >60 >60 >60  GFRAA >60 >60 >60  ANIONGAP 8 9 9     Recent Labs  Lab 10/31/18 0503 11/01/18 0530 11/02/18 0451  PROT 6.1* 6.0* 6.0*  ALBUMIN 1.4* 1.4* 1.4*  AST 12* 12* 16  ALT 13 13 15   ALKPHOS 65 63 64  BILITOT 0.4 0.4 0.4    Hematology Recent Labs  Lab 10/31/18 0503 11/01/18 0530 11/02/18 0451  WBC 8.1 7.2 7.9  RBC 2.88* 2.86* 2.76*  HGB 8.1* 7.9* 7.5*  HCT 25.9* 25.8* 24.6*  MCV 89.9 90.2 89.1  MCH 28.1 27.6 27.2  MCHC 31.3 30.6 30.5  RDW 14.2 14.3 14.3  PLT 337 365 320   Cardiac EnzymesNo results for input(s): TROPONINI in the last 168 hours. No results for input(s): TROPIPOC in the last 168 hours.  BNPNo results for input(s): BNP, PROBNP in the last 168 hours.  DDimer No results for input(s): DDIMER in the last 168 hours.  Radiology/Studies:  Dg Abd Portable 1v  Result Date: 10/29/2018 CLINICAL DATA:  Follow-up gastric catheter placement EXAM: PORTABLE ABDOMEN - 1 VIEW COMPARISON:  None. FINDINGS: Gastric catheter is noted within the stomach. Changes of prior embolus therapy are noted in the right upper quadrant. Contrast material is noted scattered throughout the colon related to the recent CT examination. Mild retained fecal material is noted. IMPRESSION: Gastric catheter within the stomach. Electronically Signed   By: Inez Catalina M.D.   On: 10/29/2018 16:34    Assessment and Plan:   Preoperative cardiac risk assessment Pt had a heart catheterization 2017 following a high risk nuclear stress test that showed no significant coronary artery disease. Echocardiogram 08/2018 showed mild reduction in EF to 45-50% and grade 1 DD, but no wall motion abnormality and no significant valvular disease. He is asymptomatic. He does require insulin to control his DM. According to the RCRI, conservatively, he is a class IV risk and is at 15% risk of death, MI, or cardiac arrest. No further cardiac workup is needed prior to surgery. I had a long discussion with the patient regarding his risk for major surgery. I do not think the risk outweighs the benefit and he agrees. Would proceed with surgery with a moderate risk for cardiac complications.    [facility] will sign off.   Medication Recommendations:   none Other recommendations (labs, testing, etc):  none Follow up as an outpatient:  PRN  For questions or updates, please contact [facility] Please consult [email or web address] for contact info under     Signed, Ledora Bottcher, [place]  11/02/2018 9:04 AM

## 2018-11-03 ENCOUNTER — Ambulatory Visit: Payer: Self-pay

## 2018-11-03 LAB — GLUCOSE, CAPILLARY
GLUCOSE-CAPILLARY: 129 mg/dL — AB (ref 70–99)
GLUCOSE-CAPILLARY: 175 mg/dL — AB (ref 70–99)
Glucose-Capillary: 131 mg/dL — ABNORMAL HIGH (ref 70–99)
Glucose-Capillary: 137 mg/dL — ABNORMAL HIGH (ref 70–99)
Glucose-Capillary: 147 mg/dL — ABNORMAL HIGH (ref 70–99)
Glucose-Capillary: 197 mg/dL — ABNORMAL HIGH (ref 70–99)
Glucose-Capillary: 71 mg/dL (ref 70–99)

## 2018-11-03 LAB — COMPREHENSIVE METABOLIC PANEL
ALBUMIN: 1.6 g/dL — AB (ref 3.5–5.0)
ALT: 17 U/L (ref 0–44)
AST: 17 U/L (ref 15–41)
Alkaline Phosphatase: 64 U/L (ref 38–126)
Anion gap: 8 (ref 5–15)
BILIRUBIN TOTAL: 0.6 mg/dL (ref 0.3–1.2)
BUN: 5 mg/dL — AB (ref 6–20)
CO2: 28 mmol/L (ref 22–32)
Calcium: 7.9 mg/dL — ABNORMAL LOW (ref 8.9–10.3)
Chloride: 99 mmol/L (ref 98–111)
Creatinine, Ser: 0.45 mg/dL — ABNORMAL LOW (ref 0.61–1.24)
GFR calc Af Amer: >60 mL/min (ref >60)
Glucose, Bld: 137 mg/dL — ABNORMAL HIGH (ref 70–99)
Potassium: 3.5 mmol/L (ref 3.5–5.1)
Sodium: 135 mmol/L (ref 135–145)
Total Protein: 6.2 g/dL — ABNORMAL LOW (ref 6.5–8.1)

## 2018-11-03 LAB — CBC
HCT: 33.5 % — ABNORMAL LOW (ref 39.0–52.0)
Hemoglobin: 10.5 g/dL — ABNORMAL LOW (ref 13.0–17.0)
MCH: 28 pg (ref 26.0–34.0)
MCHC: 31.3 g/dL (ref 30.0–36.0)
MCV: 89.5 fL (ref 80.0–100.0)
Platelets: 332 10*3/uL (ref 150–400)
RBC: 3.75 MIL/uL — ABNORMAL LOW (ref 4.22–5.81)
RDW: 14.4 % (ref 11.5–15.5)
WBC: 9.3 10*3/uL (ref 4.0–10.5)
nRBC: 0 % (ref 0.0–0.2)

## 2018-11-03 LAB — HEPARIN LEVEL (UNFRACTIONATED): Heparin Unfractionated: 0.36 IU/mL (ref 0.30–0.70)

## 2018-11-03 LAB — MAGNESIUM: Magnesium: 1.7 mg/dL (ref 1.7–2.4)

## 2018-11-03 MED ORDER — POTASSIUM CHLORIDE IN NACL 40-0.9 MEQ/L-% IV SOLN
INTRAVENOUS | Status: DC
  Administered 2018-11-03 – 2018-11-04 (×2): 100 mL/h via INTRAVENOUS
  Filled 2018-11-03 (×2): qty 1000

## 2018-11-03 MED ORDER — HYDRALAZINE HCL 20 MG/ML IJ SOLN
10.0000 mg | Freq: Once | INTRAMUSCULAR | Status: AC
  Administered 2018-11-03: 10 mg via INTRAVENOUS
  Filled 2018-11-03: qty 1

## 2018-11-03 MED ORDER — MAGNESIUM SULFATE 2 GM/50ML IV SOLN
2.0000 g | Freq: Once | INTRAVENOUS | Status: AC
  Administered 2018-11-03: 2 g via INTRAVENOUS
  Filled 2018-11-03: qty 50

## 2018-11-03 MED ORDER — SODIUM CHLORIDE 0.9 % IV SOLN
2.0000 g | INTRAVENOUS | Status: AC
  Administered 2018-11-04: 2 g via INTRAVENOUS
  Filled 2018-11-03: qty 2

## 2018-11-03 NOTE — Progress Notes (Signed)
Mr. Delisle is about the same.  He got 2 units of blood yesterday.  His hemoglobin is 10.5.  He is still on the heparin infusion.  He has been seen by nutrition service.  I very much appreciate their help.  We are trying to optimize his caloric intake.  Surgery still has not committed to surgery on Mr. Carrizales for his gastric outlet obstruction.  There is been no bleeding.  He is having some bowel movements.  He has had no fever.  He has had no cough.  He is out of bed.  He is walking a little bit.  The right foot is still dressed.  On physical exam, there is really no changes.  His temperature is 98.8.  Pulse 87.  Blood pressure 128/99.  His lungs are clear bilaterally.  Cardiac exam regular rate and rhythm with no murmurs.  Abdomen is not as distended.  Bowel sounds are present.  There is no guarding or rebound tenderness.  There is no obvious fluid wave.  Extremities shows some symmetric muscle atrophy in upper and lower extremities.  Neurological exam shows no obvious neurological deficits.  We are" wait mode" for right now.  Hopefully, Mr. Exley will be going to surgery before the weekend.  Again, I do not see that if there will be any other way to alleviate this gastric outlet obstruction.  I realize that Mr. Ohlin has an incurable malignancy.  The bypass is strictly palliative and to try to improve his quality of life so that he can eat regular food.  He will stay on the heparin infusion for right now.  Once we know what will happen with respect to surgery, then we can make an adjustment and get him on something oral.  I very much appreciate the outstanding care that he is getting from the staff up on [name]  They are doing a tremendous job with him.   [name], MD  [place] 20:6

## 2018-11-03 NOTE — H&P (View-Only) (Signed)
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 Hassell Done talked with the patient today about the possible surgery.  We described the procedure.  We told him this would not affect his cancer.  The plan was to do a bypass so he could eat.  We also told him that we might be able to get the biopsies Dr. Marin Olp requested, but we also might not be able to safely obtain those.  Patient is continuing to discuss and think about his options.  His sister wants to be included in the conversation, but she is currently at work.  We will start making preparations for surgery tomorrow with possibility of doing it on Friday also, pending availability of family to discuss risk and benefits with.  Objective: Vital signs in last 24 hours: Temp:  [98.4 F (36.9 C)-98.9 F (37.2 C)] 98.8 F (37.1 C) (02/12 0507) Pulse Rate:  [78-90] 87 (02/12 0801) Resp:  [17-18] 17 (02/12 0507) BP: (117-140)/(86-104) 117/93 (02/12 0801) SpO2:  [99 %-100 %] 99 % (02/12 0508) Weight:  [56.2 kg] 56.2 kg (02/11 1806) Last BM Date: 11/01/18 1950 PO recorded 4911 IV 3800 urine bM x 1 Afebrile, VSS BP still up some Labs are all good, H/H is up to 10.5/33.5. Cardiology evaluation:  Revised cardiac risk index is elevated at 6.6% due to DM Insulin dependent.  His LV function is mildly reduced but no change from after cath 2017 and he has not had any  CP and a cath 2 years ago with no CAD.  EKG is nonischemic.  Given the severity of his underlying comorbidities he is at moderate risk for perioperative cardiac event.     Intake/Output from previous day: 02/11 0701 - 02/12 0700 In: 7777.7 [P.O.:1950; I.V.:4911.4; Blood:916.3] Out: 3800 [Urine:3800] Intake/Output this shift: Total I/O In: -  Out: 250 [Urine:250]  General appearance: alert, cooperative and no distress Resp: clear to auscultation bilaterally GI: Soft is not tender.  Midline surgical scar with calcifications.   He is tolerating clear and then full liquids well. Extremities: Ischemic right lower extremity  Lab Results:  Recent Labs    11/02/18 0451 11/03/18 0444  WBC 7.9 9.3  HGB 7.5* 10.5*  HCT 24.6* 33.5*  PLT 320 332    BMET Recent Labs    11/02/18 0451 11/03/18 0444  NA 133* 135  K 3.0* 3.5  CL 98 99  CO2 26 28  GLUCOSE 205* 137*  BUN <5* 5*  CREATININE 0.36* 0.45*  CALCIUM 7.4* 7.9*   PT/INR Recent Labs    11/02/18 0451  LABPROT 16.2*  INR 1.31    Recent Labs  Lab 10/30/18 0626 10/31/18 0503 11/01/18 0530 11/02/18 0451 11/03/18 0444  AST 13* 12* 12* 16 17  ALT 14 13 13 15 17   ALKPHOS 72 65 63 64 64  BILITOT 0.5 0.4 0.4 0.4 0.6  PROT 6.4* 6.1* 6.0* 6.0* 6.2*  ALBUMIN 1.4* 1.4* 1.4* 1.4* 1.6*     Lipase     Component Value Date/Time   LIPASE 42 08/22/2018 1932     Medications: . atorvastatin  20 mg Oral QHS  . carvedilol  3.125 mg Oral BID WC  . dronabinol  5 mg Oral BID AC  . feeding supplement (ENSURE ENLIVE)  237 mL Oral QID  . fluconazole  100 mg Oral Daily  . gabapentin  400 mg Oral TID PC & HS  . insulin aspart  0-9 Units Subcutaneous  Q4H  . insulin detemir  4 Units Subcutaneous Daily  . 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 Chronic pain/hx of substance abuse -ETOH/Cocaine/tobacco Anemia H/H: 7.9/25.8 Hx exploratory laparotomy,closure of pyloric channel ulcer with Phillip Heal patch 12/2003 Dr.  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 we will obtain a permit.  We will aim for possible surgery tomorrow or Friday, pending family review and decision.  We will have him hold his heparin at 4 AM so we can do surgery later in the a.m., or resume heparin if we are not going to do surgery tomorrow a.m. after that decision has been made.  LOS: 61 days    Ryan Medina 11/03/2018 [phone]

## 2018-11-03 NOTE — Progress Notes (Signed)
Pt. refused to sign consent at this time. States he wants to speak with his sister and MD.

## 2018-11-03 NOTE — Progress Notes (Addendum)
PROGRESS NOTE    Ryan Medina  EPP:[id number] DOB: [birth year] DOA: 09/03/2018 PCP: Patient, No Pcp Per   Brief Narrative:58 year old with history of peripheral vascular disease, dilated cardiomyopathy, essential hypertension, diabetes mellitus type 2 initially came to the hospital with right lower extremity pain and was found to have ischemic leg requiring embolectomy on 12/13. He was placed on heparin drip subsequently developed right lower extremity hematoma requiring fasciotomy on 12/15. Unfortunately afterwards he decompensated with active GI bleed and was intubated and was moved to the ICU. He required embolization on 12/17. During the course of his hospitalization he is required total of 26 units of blood. Further evaluation showed pancreatic cancer with duodenal ulcer. Transferred to [facility] for radiation treatment to help with bleeding. Hospital course complicated by sepsis from healthcare acquired pneumonia and Pseudomonas UTI requiring treatment with vancomycin and cefepime.Patient found to have chronic PE therefore started on heparin drip per oncology.  Repeat endoscopy showed worsening of gastric outlet obstruction.  Initially NG tube placed by patient removed it.  Right now on clear liquid diet.  Assessment & Plan:   Principal Problem:   Popliteal artery embolus (HCC) Active Problems:   DM (diabetes mellitus), type 2, uncontrolled ([place])   Essential hypertension   Dilated cardiomyopathy (HCC)   Chest pain   AAA (abdominal aortic aneurysm) (HCC)   Ischemic foot   Right leg claudication (HCC)   PVD (peripheral vascular disease) ([place])   Hemorrhagic shock ([place])   Nontraumatic ischemic infarction of muscle of right lower leg   Upper GI bleed   Cocaine abuse ([place])   Acute blood loss anemia   Chronic pain syndrome   Duodenal ulcer with hemorrhage   Pancreatic cancer ([place])   Goals of care, counseling/discussion   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 Status post radiation. Per oncology plans for outpatient radiation and chemotherapy -Status post endoscopy 12/17, GDA embolization 12/17, status post radiation. -Continue PPI. - Endoscopy showed worsening of gastric outlet obstruction/pyloric stenosis secondary to malignancy.  Candida esophagitis - General surgery following and planning for surgery Friday.- Notified cardiology notes reviewed patient at moderate risk of perioperative cardiac event.    Candida esophagitis -Currently on Diflucan for total of 2 weeks.  Day 5 today.  Chronic bilateral pulmonary embolism -Heparin drip per oncology  Anemia of chronic disease Status post multiple blood transfusions  Hypokalemia/hypomagnesemia -Repletion ordered.  Sepsis secondary to H CAP and Pseudomonas urinary tract infection -Status post treatment with vancomycin and cefepime. PICC line pulled out 1/26.  Ischemic right foot with right popliteal and tibial artery embolus -Status post embolectomy. Complicated by compartment syndrome requiring fasciotomy. -Low-dose heparin started. Closely monitor for any bleeding. -Lower extremity routine dressing, follow-up outpatient with vascular  Cardiomyopathy, combined systolic and diastolic CHF, chronic -Patient appears to be euvolemic in nature. Continue to provide supportive care.  Uncontrolled  diabetes mellitus type 2 -Hemoglobin A1c 18.2.   Continue Lantus, Accu-Chek and sliding scale.  Blood glucose in acceptable range  Severe protein calorie malnutrition -Protein supplement ordered  Generalized weakness-physical therapy/Occupational Therapy-recommending skilled nursing facility Nutrition Problem: Severe Malnutrition Etiology: chronic illness(Pancreatic cancer, DM and CHF)     Signs/Symptoms: severe muscle depletion, severe fat depletion, percent weight loss Percent weight loss: 23 %    Interventions: Ensure Enlive (each supplement provides 350kcal and 20 grams of protein)  Estimated body mass index is 17.29 kg/m as calculated from the following:   Height as of this encounter: 5\' 11"  (1.803 m).   Weight as of this encounter: 56.2 kg.  DVT prophylaxis: Heparin drip Code Status: Okay for intubation partial code Family Communication: No family available Disposition Plan:  Unknown at this time   Consultants: Gastroenterology  Oncology  Interventional radiology  Critical care  Vascular surgery  Palliative care  General surgery Cardiology  Procedures: Endoscopy 12/17 EUS 12/26 Chemo-Port placement 10/12/2018 repeat endoscopy 2/ 7  antimicrobials Finished course of vancomycin and cefepime Subjective: Patient resting in bed no specific complaints other than wanting to eat pain controlled had a bowel movement no nausea vomiting Objective: Vitals:   11/03/18 0507 11/03/18 0508 11/03/18 0653 11/03/18 0801  BP: (!) 136/104 (!) 133/102 (!) 128/99 (!) 117/93  Pulse: 84 85 87 87  Resp: 17     Temp: 98.8 F (37.1 C)     TempSrc: Oral     SpO2: 100% 99%    Weight:      Height:        Intake/Output Summary (Last 24 hours) at 11/03/2018 1200 Last data filed at 11/03/2018 0928 Gross per 24 hour  Intake 7057.7 ml  Output 3300 ml  Net 3757.7 ml   Filed Weights   10/11/18 1057 10/29/18 1219 11/02/18 1806  Weight: 54.7 kg 54.7 kg 56.2 kg     Examination:  General exam: Appears calm and comfortable  Respiratory system: Clear to auscultation. Respiratory effort normal. Cardiovascular system: S1 & S2 heard, RRR. No JVD, murmurs, rubs, gallops or clicks. No pedal edema. Gastrointestinal system: Abdomen is distended, soft and nontender. No organomegaly or masses felt. Normal bowel sounds heard. Central nervous system: Alert and oriented. No focal neurological deficits. Extremities: No edema Skin: No rashes, lesions or ulcers Psychiatry: Judgement and insight appear normal. Mood & affect appropriate.     Data Reviewed: I have personally reviewed following labs and imaging studies  CBC: Recent Labs  Lab 10/29/18 0608 10/30/18 0626 10/31/18 0503 11/01/18 0530 11/02/18 0451 11/03/18 0444  WBC 9.5 8.9 8.1 7.2 7.9 9.3  NEUTROABS 7.9* 7.4 6.4 5.7 6.3  --   HGB 8.4* 9.6* 8.1* 7.9* 7.5* 10.5*  HCT 27.5* 31.0* 25.9* 25.8* 24.6* 33.5*  MCV 88.1 89.1 89.9 90.2 89.1 89.5  PLT 341 414* 337 365 320 081   Basic Metabolic Panel: Recent Labs  Lab 10/30/18 0626 10/31/18 0503 11/01/18 0530 11/02/18 0451 11/03/18 0444  NA 133* 135 137 133* 135  K 3.6 3.3* 3.8 3.0* 3.5  CL 98 99 101 98 99  CO2 26 28 27 26 28   GLUCOSE 124* 227* 184* 205* 137*  BUN 7 <5* <5* <5* 5*  CREATININE 0.41* 0.42* 0.42* 0.36* 0.45*  CALCIUM 8.0* 7.6* 7.8* 7.4* 7.9*  MG  --   --  1.7 1.5* 1.7   GFR: Estimated Creatinine Clearance: 81 mL/min (A) (by C-G formula based on SCr of 0.45 mg/dL (L)). Liver Function Tests: Recent Labs  Lab 10/30/18 0626 10/31/18 0503 11/01/18 0530 11/02/18 0451 11/03/18 0444  AST 13*  12* 12* 16 17  ALT 14 13 13 15 17   ALKPHOS 72 65 63 64 64  BILITOT 0.5 0.4 0.4 0.4 0.6  PROT 6.4* 6.1* 6.0* 6.0* 6.2*  ALBUMIN 1.4* 1.4* 1.4* 1.4* 1.6*   No results for input(s): LIPASE, AMYLASE in the last 168 hours. No results for input(s): AMMONIA in the last 168 hours. Coagulation Profile: Recent Labs  Lab 11/02/18 0451   INR 1.31   Cardiac Enzymes: No results for input(s): CKTOTAL, CKMB, CKMBINDEX, TROPONINI in the last 168 hours. BNP (last 3 results) No results for input(s): PROBNP in the last 8760 hours. HbA1C: No results for input(s): HGBA1C in the last 72 hours. CBG: Recent Labs  Lab 11/02/18 1615 11/02/18 1948 11/02/18 2358 11/03/18 0410 11/03/18 0754  GLUCAP 149* 179* 175* 129* 197*   Lipid Profile: No results for input(s): CHOL, HDL, LDLCALC, TRIG, CHOLHDL, LDLDIRECT in the last 72 hours. Thyroid Function Tests: No results for input(s): TSH, T4TOTAL, FREET4, T3FREE, THYROIDAB in the last 72 hours. Anemia Panel: No results for input(s): VITAMINB12, FOLATE, FERRITIN, TIBC, IRON, RETICCTPCT in the last 72 hours. Sepsis Labs: No results for input(s): PROCALCITON, LATICACIDVEN in the last 168 hours.  No results found for this or any previous visit (from the past 240 hour(s)).       Radiology Studies: No results found.      Scheduled Meds: . atorvastatin  20 mg Oral QHS  . carvedilol  3.125 mg Oral BID WC  . dronabinol  5 mg Oral BID AC  . feeding supplement (ENSURE ENLIVE)  237 mL Oral QID  . fluconazole  100 mg Oral Daily  . gabapentin  400 mg Oral TID PC & HS  . insulin aspart  0-9 Units Subcutaneous Q4H  . insulin detemir  4 Units Subcutaneous Daily  . morphine  15 mg Oral Q12H   Continuous Infusions: . 0.9 % NaCl with KCl 40 mEq / L    . cefoTEtan (CEFOTAN) IV    . dextrose 5 % and 0.45% NaCl 75 mL/hr at 11/03/18 1040  . heparin 1,600 Units/hr (11/03/18 1041)     LOS: 61 days     Georgette Shell, MD [facility]  If 7PM-7AM, please contact night-coverage [email or web address] Password [email or web address] 11/03/2018, 12:00 PM

## 2018-11-03 NOTE — Progress Notes (Addendum)
[place] for Heparin - no bolus Indication: PE  Allergies  Allergen Reactions  . Lisinopril Anaphylaxis    Patient Measurements: Height: 5\' 11"  (180.3 cm) Weight: 124 lb (56.2 kg) IBW/kg (Calculated) : 75.3 Heparin Dosing Weight: 66.7  Vital Signs: Temp: 98.8 F (37.1 C) (02/12 0507) Temp Source: Oral (02/12 0507) BP: 128/99 (02/12 0653) Pulse Rate: 87 (02/12 0653)  Labs: Recent Labs    11/01/18 0530 11/01/18 0921 11/02/18 0451 11/02/18 0832 11/03/18 0444  HGB 7.9*  --  7.5*  --  10.5*  HCT 25.8*  --  24.6*  --  33.5*  PLT 365  --  320  --  332  APTT  --  115*  --   --   --   LABPROT  --   --  16.2*  --   --   INR  --   --  1.31  --   --   HEPARINUNFRC 0.58  --  1.42* 0.47 0.36  CREATININE 0.42*  --  0.36*  --  0.45*   Estimated Creatinine Clearance: 81 mL/min (A) (by C-G formula based on SCr of 0.45 mg/dL (L)).  Assessment: 58 yo male with arterial embolus s/p embolectomy. Patient had development of hematoma with compartment syndrome as well as hemorrhagic shock secondary to a bleeding duodenal ulcer.   Events this admission on 09/03/18:  LLE DVT/ischemia s/p urgent left popliteal artery embolectomy 12/13 >> RLE hematoma on 12/15 am s/p evacuation in OR.Heparin stopped 2/2 drop in hgb, hematemesis. -Heparin cautiously started 12/22,had few episodes of hematemesis & nose bleed 12/24> stopped heparin per dr. Candiss Norse S/p EUS 12/26:  pancreatic mass eroding into duodenum -VVS notes ikely too high risk for [facility]  12/28- Dr. Candiss Norse noted Continue PPI and Hold Heparin indefinitely  2/5: pharmacy consulted to dose heparin with no bolus by Dr Marin Olp for acute PE. Previously on 12/24 his heparin level was therapeutic at 0.35 on 1200 units/hr. 2.7: Heparin off at 09 am for EDG, heparin to resume after NG placed per GI instructions. KUB 1629> NG in stomach.   11/03/2018 Heparin level therapeutic at 0.36 on 1600 units/hr. Hg up to 10.5 after  2 U PRBCs given yesterday. PLTC WNL No bleeding reported. CCS consulted for possible palliative bypass procedure for relief of gastric outlet obstruction. CCS rec gastrostomy tube and feeding jejunostomy.  INR 1.31 on 2/11> has been elevated entire admission, as high at 1.5 on 09/27/18. Not on coumadin.   Goal of Therapy:  Heparin level 0.3-0.5 units/mL Monitor platelets by anticoagulation protocol: Yes     Plan:  Continue Heparin infusion at 1600 units/hr  F/u plans for surgery - heparin will be held pre-op  Daily HL and CBC while on heparin  Eudelia Bunch, Pharm.D [phone] 11/03/2018 7:31 AM  Addendum: Heparin to be stopped tomorrow at 04 am for possible OR Pharmacy will f/u plans  Eudelia Bunch, Pharm.D [phone] 11/03/2018 11:47 AM

## 2018-11-03 NOTE — Progress Notes (Signed)
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 Hassell Done talked with the patient today about the possible surgery.  We described the procedure.  We told him this would not affect his cancer.  The plan was to do a bypass so he could eat.  We also told him that we might be able to get the biopsies Dr. Marin Olp requested, but we also might not be able to safely obtain those.  Patient is continuing to discuss and think about his options.  His sister wants to be included in the conversation, but she is currently at work.  We will start making preparations for surgery tomorrow with possibility of doing it on Friday also, pending availability of family to discuss risk and benefits with.  Objective: Vital signs in last 24 hours: Temp:  [98.4 F (36.9 C)-98.9 F (37.2 C)] 98.8 F (37.1 C) (02/12 0507) Pulse Rate:  [78-90] 87 (02/12 0801) Resp:  [17-18] 17 (02/12 0507) BP: (117-140)/(86-104) 117/93 (02/12 0801) SpO2:  [99 %-100 %] 99 % (02/12 0508) Weight:  [56.2 kg] 56.2 kg (02/11 1806) Last BM Date: 11/01/18 1950 PO recorded 4911 IV 3800 urine bM x 1 Afebrile, VSS BP still up some Labs are all good, H/H is up to 10.5/33.5. Cardiology evaluation:  Revised cardiac risk index is elevated at 6.6% due to DM Insulin dependent.  His LV function is mildly reduced but no change from after cath 2017 and he has not had any  CP and a cath 2 years ago with no CAD.  EKG is nonischemic.  Given the severity of his underlying comorbidities he is at moderate risk for perioperative cardiac event.     Intake/Output from previous day: 02/11 0701 - 02/12 0700 In: 7777.7 [P.O.:1950; I.V.:4911.4; Blood:916.3] Out: 3800 [Urine:3800] Intake/Output this shift: Total I/O In: -  Out: 250 [Urine:250]  General appearance: alert, cooperative and no distress Resp: clear to auscultation bilaterally GI: Soft is not tender.  Midline surgical scar with calcifications.   He is tolerating clear and then full liquids well. Extremities: Ischemic right lower extremity  Lab Results:  Recent Labs    11/02/18 0451 11/03/18 0444  WBC 7.9 9.3  HGB 7.5* 10.5*  HCT 24.6* 33.5*  PLT 320 332    BMET Recent Labs    11/02/18 0451 11/03/18 0444  NA 133* 135  K 3.0* 3.5  CL 98 99  CO2 26 28  GLUCOSE 205* 137*  BUN <5* 5*  CREATININE 0.36* 0.45*  CALCIUM 7.4* 7.9*   PT/INR Recent Labs    11/02/18 0451  LABPROT 16.2*  INR 1.31    Recent Labs  Lab 10/30/18 0626 10/31/18 0503 11/01/18 0530 11/02/18 0451 11/03/18 0444  AST 13* 12* 12* 16 17  ALT 14 13 13 15 17   ALKPHOS 72 65 63 64 64  BILITOT 0.5 0.4 0.4 0.4 0.6  PROT 6.4* 6.1* 6.0* 6.0* 6.2*  ALBUMIN 1.4* 1.4* 1.4* 1.4* 1.6*     Lipase     Component Value Date/Time   LIPASE 42 08/22/2018 1932     Medications: . atorvastatin  20 mg Oral QHS  . carvedilol  3.125 mg Oral BID WC  . dronabinol  5 mg Oral BID AC  . feeding supplement (ENSURE ENLIVE)  237 mL Oral QID  . fluconazole  100 mg Oral Daily  . gabapentin  400 mg Oral TID PC & HS  . insulin aspart  0-9 Units Subcutaneous  Q4H  . insulin detemir  4 Units Subcutaneous Daily  . 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 Chronic pain/hx of substance abuse -ETOH/Cocaine/tobacco Anemia H/H: 7.9/25.8 Hx exploratory laparotomy,closure of pyloric channel ulcer with Phillip Heal patch 12/2003 Dr.  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 we will obtain a permit.  We will aim for possible surgery tomorrow or Friday, pending family review and decision.  We will have him hold his heparin at 4 AM so we can do surgery later in the a.m., or resume heparin if we are not going to do surgery tomorrow a.m. after that decision has been made.  LOS: 61 days    Tameshia Bonneville 11/03/2018 [phone]

## 2018-11-04 ENCOUNTER — Inpatient Hospital Stay (HOSPITAL_COMMUNITY): Payer: Medicaid Other | Admitting: Certified Registered"

## 2018-11-04 ENCOUNTER — Encounter (HOSPITAL_COMMUNITY)
Admission: EM | Disposition: A | Discharge status: Skilled Nursing Facility | Payer: Self-pay | Source: Home / Self Care | Attending: Internal Medicine

## 2018-11-04 ENCOUNTER — Encounter (HOSPITAL_COMMUNITY): Payer: Self-pay | Admitting: Certified Registered"

## 2018-11-04 HISTORY — PX: GASTROJEJUNOSTOMY: SHX1697

## 2018-11-04 HISTORY — PX: LAPAROSCOPY: SHX197

## 2018-11-04 LAB — CBC
HCT: 32.7 % — ABNORMAL LOW (ref 39.0–52.0)
Hemoglobin: 10.6 g/dL — ABNORMAL LOW (ref 13.0–17.0)
MCH: 28.5 pg (ref 26.0–34.0)
MCHC: 32.4 g/dL (ref 30.0–36.0)
MCV: 87.9 fL (ref 80.0–100.0)
NRBC: 0 % (ref 0.0–0.2)
Platelets: 329 10*3/uL (ref 150–400)
RBC: 3.72 MIL/uL — AB (ref 4.22–5.81)
RDW: 14.6 % (ref 11.5–15.5)
WBC: 9.7 10*3/uL (ref 4.0–10.5)

## 2018-11-04 LAB — TYPE AND SCREEN
ABO/RH(D): B POS
ABO/RH(D): B POS
ANTIBODY SCREEN: NEGATIVE
Antibody Screen: NEGATIVE
Unit division: 0
Unit division: 0

## 2018-11-04 LAB — COMPREHENSIVE METABOLIC PANEL
ALT: 17 U/L (ref 0–44)
AST: 15 U/L (ref 15–41)
Albumin: 1.5 g/dL — ABNORMAL LOW (ref 3.5–5.0)
Alkaline Phosphatase: 69 U/L (ref 38–126)
Anion gap: 10 (ref 5–15)
BUN: 6 mg/dL (ref 6–20)
CO2: 26 mmol/L (ref 22–32)
Calcium: 7.9 mg/dL — ABNORMAL LOW (ref 8.9–10.3)
Chloride: 99 mmol/L (ref 98–111)
Creatinine, Ser: 0.42 mg/dL — ABNORMAL LOW (ref 0.61–1.24)
GFR calc Af Amer: >60 mL/min (ref >60)
GFR calc non Af Amer: >60 mL/min (ref >60)
GLUCOSE: 104 mg/dL — AB (ref 70–99)
Potassium: 4.1 mmol/L (ref 3.5–5.1)
Sodium: 135 mmol/L (ref 135–145)
Total Bilirubin: 0.3 mg/dL (ref 0.3–1.2)
Total Protein: 6.6 g/dL (ref 6.5–8.1)

## 2018-11-04 LAB — BPAM RBC
Blood Product Expiration Date: 202003032359
Blood Product Expiration Date: 202003052359
ISSUE DATE / TIME: 202002111158
ISSUE DATE / TIME: 202002111533
Unit Type and Rh: 7300
Unit Type and Rh: 7300

## 2018-11-04 LAB — GLUCOSE, CAPILLARY
Glucose-Capillary: 112 mg/dL — ABNORMAL HIGH (ref 70–99)
Glucose-Capillary: 126 mg/dL — ABNORMAL HIGH (ref 70–99)
Glucose-Capillary: 135 mg/dL — ABNORMAL HIGH (ref 70–99)
Glucose-Capillary: 153 mg/dL — ABNORMAL HIGH (ref 70–99)
Glucose-Capillary: 157 mg/dL — ABNORMAL HIGH (ref 70–99)
Glucose-Capillary: 76 mg/dL (ref 70–99)
Glucose-Capillary: 78 mg/dL (ref 70–99)
Glucose-Capillary: 98 mg/dL (ref 70–99)

## 2018-11-04 LAB — MAGNESIUM: Magnesium: 1.7 mg/dL (ref 1.7–2.4)

## 2018-11-04 LAB — HEPARIN LEVEL (UNFRACTIONATED): Heparin Unfractionated: 0.35 IU/mL (ref 0.30–0.70)

## 2018-11-04 SURGERY — LAPAROSCOPY, DIAGNOSTIC
Anesthesia: General | Site: Abdomen

## 2018-11-04 MED ORDER — METOPROLOL TARTRATE 5 MG/5ML IV SOLN
5.0000 mg | Freq: Four times a day (QID) | INTRAVENOUS | Status: DC | PRN
  Filled 2018-11-04 (×2): qty 5

## 2018-11-04 MED ORDER — MEPERIDINE HCL 50 MG/ML IJ SOLN: 6.2500 mg | INTRAMUSCULAR | Status: DC | PRN

## 2018-11-04 MED ORDER — HYDROMORPHONE HCL 1 MG/ML IJ SOLN
0.5000 mg | INTRAMUSCULAR | Status: DC | PRN
  Administered 2018-11-05 – 2018-11-12 (×43): 1 mg via INTRAVENOUS
  Filled 2018-11-04 (×43): qty 1

## 2018-11-04 MED ORDER — SUGAMMADEX SODIUM 200 MG/2ML IV SOLN
INTRAVENOUS | Status: AC
  Filled 2018-11-04: qty 2

## 2018-11-04 MED ORDER — SODIUM CHLORIDE 0.9 % IV SOLN
INTRAVENOUS | Status: DC | PRN
  Administered 2018-11-04: 40 ug/min via INTRAVENOUS

## 2018-11-04 MED ORDER — SODIUM CHLORIDE 0.9 % IV SOLN: 2.0000 g | Freq: Once | INTRAVENOUS | Status: DC

## 2018-11-04 MED ORDER — PROPOFOL 10 MG/ML IV BOLUS
INTRAVENOUS | Status: DC | PRN
  Administered 2018-11-04: 100 mg via INTRAVENOUS

## 2018-11-04 MED ORDER — HYDROMORPHONE HCL 1 MG/ML IJ SOLN: 0.2500 mg | INTRAMUSCULAR | Status: DC | PRN

## 2018-11-04 MED ORDER — HEPARIN (PORCINE) 25000 UT/250ML-% IV SOLN
1700.0000 [IU]/h | INTRAVENOUS | Status: DC
  Administered 2018-11-04: 1600 [IU]/h via INTRAVENOUS
  Filled 2018-11-04 (×2): qty 250

## 2018-11-04 MED ORDER — METHOCARBAMOL 1000 MG/10ML IJ SOLN
500.0000 mg | Freq: Three times a day (TID) | INTRAVENOUS | Status: DC
  Administered 2018-11-04 – 2018-11-12 (×23): 500 mg via INTRAVENOUS
  Filled 2018-11-04 (×7): qty 500
  Filled 2018-11-04: qty 5
  Filled 2018-11-04 (×4): qty 500
  Filled 2018-11-04: qty 5
  Filled 2018-11-04: qty 500
  Filled 2018-11-04: qty 5
  Filled 2018-11-04 (×5): qty 500
  Filled 2018-11-04: qty 5
  Filled 2018-11-04 (×3): qty 500
  Filled 2018-11-04: qty 5
  Filled 2018-11-04: qty 500

## 2018-11-04 MED ORDER — ONDANSETRON HCL 4 MG/2ML IJ SOLN
INTRAMUSCULAR | Status: AC
  Filled 2018-11-04: qty 2

## 2018-11-04 MED ORDER — FENTANYL CITRATE (PF) 250 MCG/5ML IJ SOLN
INTRAMUSCULAR | Status: AC
  Filled 2018-11-04: qty 5

## 2018-11-04 MED ORDER — LIDOCAINE 2% (20 MG/ML) 5 ML SYRINGE
INTRAMUSCULAR | Status: DC | PRN
  Administered 2018-11-04: 60 mg via INTRAVENOUS

## 2018-11-04 MED ORDER — DEXAMETHASONE SODIUM PHOSPHATE 10 MG/ML IJ SOLN
INTRAMUSCULAR | Status: DC | PRN
  Administered 2018-11-04: 10 mg via INTRAVENOUS

## 2018-11-04 MED ORDER — DEXAMETHASONE SODIUM PHOSPHATE 10 MG/ML IJ SOLN
INTRAMUSCULAR | Status: AC
  Filled 2018-11-04: qty 1

## 2018-11-04 MED ORDER — LACTATED RINGERS IV SOLN
INTRAVENOUS | Status: DC
  Administered 2018-11-04 (×2): via INTRAVENOUS

## 2018-11-04 MED ORDER — ALBUMIN HUMAN 5 % IV SOLN
INTRAVENOUS | Status: DC | PRN
  Administered 2018-11-04: 10:00:00 via INTRAVENOUS

## 2018-11-04 MED ORDER — ROCURONIUM BROMIDE 100 MG/10ML IV SOLN
INTRAVENOUS | Status: AC
  Filled 2018-11-04: qty 1

## 2018-11-04 MED ORDER — MIDAZOLAM HCL 2 MG/2ML IJ SOLN: 0.5000 mg | Freq: Once | INTRAMUSCULAR | Status: DC | PRN

## 2018-11-04 MED ORDER — MENTHOL 3 MG MT LOZG
1.0000 | LOZENGE | OROMUCOSAL | Status: DC | PRN
  Filled 2018-11-04: qty 9

## 2018-11-04 MED ORDER — MIDAZOLAM HCL 2 MG/2ML IJ SOLN
INTRAMUSCULAR | Status: AC
  Filled 2018-11-04: qty 2

## 2018-11-04 MED ORDER — PROPOFOL 10 MG/ML IV BOLUS
INTRAVENOUS | Status: AC
  Filled 2018-11-04: qty 20

## 2018-11-04 MED ORDER — ONDANSETRON HCL 4 MG/2ML IJ SOLN
INTRAMUSCULAR | Status: DC | PRN
  Administered 2018-11-04: 4 mg via INTRAVENOUS

## 2018-11-04 MED ORDER — PHENYLEPHRINE HCL 10 MG/ML IJ SOLN
INTRAMUSCULAR | Status: AC
  Filled 2018-11-04: qty 1

## 2018-11-04 MED ORDER — LORAZEPAM 2 MG/ML IJ SOLN
1.0000 mg | INTRAMUSCULAR | Status: DC | PRN
  Administered 2018-11-15 – 2018-11-20 (×2): 1 mg via INTRAVENOUS
  Filled 2018-11-04 (×3): qty 1

## 2018-11-04 MED ORDER — LACTATED RINGERS IR SOLN
Status: DC | PRN
  Administered 2018-11-04: 1000 mL

## 2018-11-04 MED ORDER — HYDROMORPHONE HCL 1 MG/ML IJ SOLN: 1.0000 mg | INTRAMUSCULAR | Status: DC | PRN

## 2018-11-04 MED ORDER — BUPIVACAINE LIPOSOME 1.3 % IJ SUSP
20.0000 mL | Freq: Once | INTRAMUSCULAR | Status: AC
  Administered 2018-11-04: 20 mL
  Filled 2018-11-04: qty 20

## 2018-11-04 MED ORDER — ROCURONIUM BROMIDE 10 MG/ML (PF) SYRINGE
PREFILLED_SYRINGE | INTRAVENOUS | Status: DC | PRN
  Administered 2018-11-04: 30 mg via INTRAVENOUS
  Administered 2018-11-04: 5 mg via INTRAVENOUS
  Administered 2018-11-04: 10 mg via INTRAVENOUS

## 2018-11-04 MED ORDER — MIDAZOLAM HCL 5 MG/5ML IJ SOLN
INTRAMUSCULAR | Status: DC | PRN
  Administered 2018-11-04 (×2): 1 mg via INTRAVENOUS

## 2018-11-04 MED ORDER — SUCCINYLCHOLINE CHLORIDE 200 MG/10ML IV SOSY
PREFILLED_SYRINGE | INTRAVENOUS | Status: AC
  Filled 2018-11-04: qty 10

## 2018-11-04 MED ORDER — 0.9 % SODIUM CHLORIDE (POUR BTL) OPTIME
TOPICAL | Status: DC | PRN
  Administered 2018-11-04: 4000 mL

## 2018-11-04 MED ORDER — EPHEDRINE SULFATE-NACL 50-0.9 MG/10ML-% IV SOSY
PREFILLED_SYRINGE | INTRAVENOUS | Status: DC | PRN
  Administered 2018-11-04: 10 mg via INTRAVENOUS

## 2018-11-04 MED ORDER — HEPARIN SOD (PORK) LOCK FLUSH 100 UNIT/ML IV SOLN
500.0000 [IU] | INTRAVENOUS | Status: DC
  Filled 2018-11-04: qty 5

## 2018-11-04 MED ORDER — PHENYLEPHRINE 40 MCG/ML (10ML) SYRINGE FOR IV PUSH (FOR BLOOD PRESSURE SUPPORT)
PREFILLED_SYRINGE | INTRAVENOUS | Status: AC
  Filled 2018-11-04: qty 10

## 2018-11-04 MED ORDER — PHENYLEPHRINE 40 MCG/ML (10ML) SYRINGE FOR IV PUSH (FOR BLOOD PRESSURE SUPPORT)
PREFILLED_SYRINGE | INTRAVENOUS | Status: DC | PRN
  Administered 2018-11-04 (×4): 80 ug via INTRAVENOUS

## 2018-11-04 MED ORDER — LIDOCAINE 2% (20 MG/ML) 5 ML SYRINGE
INTRAMUSCULAR | Status: AC
  Filled 2018-11-04: qty 5

## 2018-11-04 MED ORDER — FENTANYL CITRATE (PF) 100 MCG/2ML IJ SOLN
INTRAMUSCULAR | Status: DC | PRN
  Administered 2018-11-04 (×5): 50 ug via INTRAVENOUS

## 2018-11-04 MED ORDER — EPHEDRINE 5 MG/ML INJ
INTRAVENOUS | Status: AC
  Filled 2018-11-04: qty 10

## 2018-11-04 MED ORDER — ONDANSETRON HCL 4 MG/2ML IJ SOLN
4.0000 mg | Freq: Four times a day (QID) | INTRAMUSCULAR | Status: DC | PRN
  Administered 2018-11-21: 4 mg via INTRAVENOUS
  Filled 2018-11-04: qty 2

## 2018-11-04 MED ORDER — PHENOL 1.4 % MT LIQD: 1.0000 | OROMUCOSAL | Status: DC | PRN

## 2018-11-04 MED ORDER — SUGAMMADEX SODIUM 200 MG/2ML IV SOLN
INTRAVENOUS | Status: DC | PRN
  Administered 2018-11-04: 125 mg via INTRAVENOUS

## 2018-11-04 MED ORDER — SUCCINYLCHOLINE CHLORIDE 200 MG/10ML IV SOSY
PREFILLED_SYRINGE | INTRAVENOUS | Status: DC | PRN
  Administered 2018-11-04: 80 mg via INTRAVENOUS

## 2018-11-04 MED ORDER — HEPARIN SOD (PORK) LOCK FLUSH 100 UNIT/ML IV SOLN
500.0000 [IU] | INTRAVENOUS | Status: DC | PRN
  Filled 2018-11-04: qty 5

## 2018-11-04 MED ORDER — PROMETHAZINE HCL 25 MG/ML IJ SOLN: 6.2500 mg | INTRAMUSCULAR | Status: DC | PRN

## 2018-11-04 MED ORDER — SODIUM CHLORIDE 0.9 % IV SOLN
2.0000 g | Freq: Once | INTRAVENOUS | Status: AC
  Administered 2018-11-04: 2 g via INTRAVENOUS
  Filled 2018-11-04: qty 2

## 2018-11-04 MED ORDER — BUPIVACAINE LIPOSOME 1.3 % IJ SUSP: 20.0000 mL | Freq: Once | INTRAMUSCULAR | Status: DC

## 2018-11-04 MED ORDER — ALBUMIN HUMAN 5 % IV SOLN
INTRAVENOUS | Status: AC
  Filled 2018-11-04: qty 250

## 2018-11-04 SURGICAL SUPPLY — 66 items
APPLIER CLIP ROT 10 11.4 M/L (STAPLE)
BENZOIN TINCTURE PRP APPL 2/3 (GAUZE/BANDAGES/DRESSINGS) ×2 IMPLANT
CABLE HIGH FREQUENCY MONO STRZ (ELECTRODE) IMPLANT
CLIP APPLIE ROT 10 11.4 M/L (STAPLE) IMPLANT
CLOSURE WOUND 1/2 X4 (GAUZE/BANDAGES/DRESSINGS)
COVER WAND RF STERILE (DRAPES) ×2 IMPLANT
DECANTER SPIKE VIAL GLASS SM (MISCELLANEOUS) ×2 IMPLANT
DEVICE SUT QUICK LOAD TK 5 (STAPLE) IMPLANT
DEVICE SUT TI-KNOT TK 5X26 (MISCELLANEOUS) IMPLANT
DEVICE SUTURE ENDOST 10MM (ENDOMECHANICALS) ×2 IMPLANT
DEVICE TI KNOT TK5 (MISCELLANEOUS)
DISSECTOR BLUNT TIP ENDO 5MM (MISCELLANEOUS) ×2 IMPLANT
DRAIN PENROSE 18X1/2 LTX STRL (DRAIN) ×2 IMPLANT
DRAPE LAPAROSCOPIC ABDOMINAL (DRAPES) ×2 IMPLANT
DRSG OPSITE POSTOP 4X8 (GAUZE/BANDAGES/DRESSINGS) ×2 IMPLANT
DRSG TEGADERM 2-3/8X2-3/4 SM (GAUZE/BANDAGES/DRESSINGS) ×2 IMPLANT
ELECT PENCIL ROCKER SW 15FT (MISCELLANEOUS) ×2 IMPLANT
ELECT REM PT RETURN 15FT ADLT (MISCELLANEOUS) ×4 IMPLANT
FELT TEFLON 4 X1 (Mesh General) ×2 IMPLANT
GAUZE SPONGE 2X2 8PLY STRL LF (GAUZE/BANDAGES/DRESSINGS) IMPLANT
GLOVE BIOGEL M 8.0 STRL (GLOVE) ×4 IMPLANT
GLOVE BIOGEL PI IND STRL 7.0 (GLOVE) IMPLANT
GLOVE BIOGEL PI INDICATOR 7.0 (GLOVE)
GOWN SPEC L4 XLG W/TWL (GOWN DISPOSABLE) ×2 IMPLANT
GOWN STRL REUS W/TWL LRG LVL3 (GOWN DISPOSABLE) ×2 IMPLANT
GOWN STRL REUS W/TWL XL LVL3 (GOWN DISPOSABLE) ×14 IMPLANT
HANDLE STAPLE EGIA 4 XL (STAPLE) ×2 IMPLANT
KIT BASIN OR (CUSTOM PROCEDURE TRAY) ×4 IMPLANT
NS IRRIG 1000ML POUR BTL (IV SOLUTION) ×6 IMPLANT
PAD POSITIONING PINK XL (MISCELLANEOUS) IMPLANT
PENCIL ELECTRO RS 15 CORD (MISCELLANEOUS) ×2 IMPLANT
PROTECTOR NERVE ULNAR (MISCELLANEOUS) IMPLANT
QUICK LOAD TK 5 (STAPLE)
RELOAD EGIA 60 MED/THCK PURPLE (STAPLE) ×4 IMPLANT
RELOAD STAPLE 60 MED/THCK ART (STAPLE) IMPLANT
SCISSORS LAP 5X35 DISP (ENDOMECHANICALS) ×4 IMPLANT
SET IRRIG TUBING LAPAROSCOPIC (IRRIGATION / IRRIGATOR) ×4 IMPLANT
SET TUBE SMOKE EVAC HIGH FLOW (TUBING) ×4 IMPLANT
SHEARS HARMONIC ACE PLUS 36CM (ENDOMECHANICALS) ×2 IMPLANT
SLEEVE ADV FIXATION 5X100MM (TROCAR) IMPLANT
SOLUTION ANTI FOG 6CC (MISCELLANEOUS) ×2 IMPLANT
SPONGE GAUZE 2X2 STER 10/PKG (GAUZE/BANDAGES/DRESSINGS) ×2
STAPLER VISISTAT 35W (STAPLE) ×4 IMPLANT
STRIP CLOSURE SKIN 1/2X4 (GAUZE/BANDAGES/DRESSINGS) IMPLANT
SUCTION POOLE TIP (SUCTIONS) ×2 IMPLANT
SUT NOVA 1 T20/GS 25DT (SUTURE) ×8 IMPLANT
SUT PDS AB 4-0 SH 27 (SUTURE) ×4 IMPLANT
SUT SILK 2 0 (SUTURE) ×2
SUT SILK 2 0 SH CR/8 (SUTURE) ×4 IMPLANT
SUT SILK 2-0 18XBRD TIE 12 (SUTURE) IMPLANT
SUT SURGIDAC NAB ES-9 0 48 120 (SUTURE) ×8 IMPLANT
SUT VIC AB 4-0 SH 18 (SUTURE) ×4 IMPLANT
SYR 30ML LL (SYRINGE) ×2 IMPLANT
TAPE CLOTH 4X10 WHT NS (GAUZE/BANDAGES/DRESSINGS) IMPLANT
TIP INNERVISION DETACH 40FR (MISCELLANEOUS) IMPLANT
TIP INNERVISION DETACH 50FR (MISCELLANEOUS) IMPLANT
TIP INNERVISION DETACH 56FR (MISCELLANEOUS) IMPLANT
TIPS INNERVISION DETACH 40FR (MISCELLANEOUS)
TRAY FOLEY MTR SLVR 16FR STAT (SET/KITS/TRAYS/PACK) ×4 IMPLANT
TRAY LAPAROSCOPIC (CUSTOM PROCEDURE TRAY) ×4 IMPLANT
TROCAR ADV FIXATION 11X100MM (TROCAR) IMPLANT
TROCAR ADV FIXATION 5X100MM (TROCAR) IMPLANT
TROCAR BLADELESS OPT 5 100 (ENDOMECHANICALS) ×4 IMPLANT
TROCAR XCEL BLUNT TIP 100MML (ENDOMECHANICALS) IMPLANT
TROCAR XCEL NON-BLD 11X100MML (ENDOMECHANICALS) ×2 IMPLANT
TROCAR XCEL UNIV SLVE 11M 100M (ENDOMECHANICALS) IMPLANT

## 2018-11-04 NOTE — Progress Notes (Signed)
PACU NURSING NOTE: informed MD that patient self removed NG tube, orders rec to leave NG tube out, pt to completely NPO until further notice

## 2018-11-04 NOTE — Interval H&P Note (Signed)
History and Physical Interval Note:  11/04/2018 9:08 AM  Ryan Medina  has presented today for surgery, with the diagnosis of Unresectable pancreatic cancer with gastric outlet syndrome  The various methods of treatment have been discussed with the patient and family. After consideration of risks, benefits and other options for treatment, the patient has consented to  Procedure(s): EXPLORATORY LAPAROSCOPY  POSSIBLE EXPLORATORY LAPAROTOMY POSSIBLE BIOPSY (N/A) GASTROJEJUNOSTOMY (N/A) as a surgical intervention .  The patient's history has been reviewed, patient examined, no change in status, stable for surgery.  I have reviewed the patient's chart and labs.  Questions were answered to the patient's satisfaction.     Pedro Earls

## 2018-11-04 NOTE — Transfer of Care (Signed)
Immediate Anesthesia Transfer of Care Note  Patient: Ryan Medina  Procedure(s) Performed: LAPAROSCOPY   EXPLORATORY LAPAROTOMY GASTROJEJUNOSTOMY (N/A Abdomen) GASTROJEJUNOSTOMY (N/A )  Patient Location: PACU  Anesthesia Type:General  Level of Consciousness: awake, alert  and oriented  Airway & Oxygen Therapy: Patient Spontanous Breathing and Patient connected to face mask oxygen  Post-op Assessment: Report given to RN and Post -op Vital signs reviewed and stable  Post vital signs: Reviewed and stable  Last Vitals:  Vitals Value Taken Time  BP    Temp    Pulse 96 11/04/2018 11:43 AM  Resp 16 11/04/2018 11:43 AM  SpO2 100 % 11/04/2018 11:43 AM  Vitals shown include unvalidated device data.  Last Pain:  Vitals:   11/04/18 0829  TempSrc: Oral  PainSc:       Patients Stated Pain Goal: 0 (03/79/55 8316)  Complications: No apparent anesthesia complications

## 2018-11-04 NOTE — Anesthesia Procedure Notes (Signed)
Procedure Name: Intubation Date/Time: 11/04/2018 9:29 AM Performed by: Harshitha Fretz D, CRNA Pre-anesthesia Checklist: Patient identified, Emergency Drugs available, Suction available and Patient being monitored Patient Re-evaluated:Patient Re-evaluated prior to induction Oxygen Delivery Method: Circle system utilized Preoxygenation: Pre-oxygenation with 100% oxygen Induction Type: IV induction, Rapid sequence and Cricoid Pressure applied Laryngoscope Size: Mac and 4 Grade View: Grade I Tube type: Oral Tube size: 7.5 mm Number of attempts: 1 Airway Equipment and Method: Stylet Placement Confirmation: ETT inserted through vocal cords under direct vision,  positive ETCO2 and breath sounds checked- equal and bilateral Secured at: 22 cm Tube secured with: Tape Dental Injury: Teeth and Oropharynx as per pre-operative assessment

## 2018-11-04 NOTE — Anesthesia Preprocedure Evaluation (Addendum)
Anesthesia Evaluation  Patient identified by MRN, date of birth, ID band Patient awake    Reviewed: Allergy & Precautions, NPO status , Patient's Chart, lab work & pertinent test results  History of Anesthesia Complications Negative for: history of anesthetic complications  Airway Mallampati: I  TM Distance: >3 FB Neck ROM: Full    Dental  (+) Dental Advisory Given   Pulmonary Current Smoker,    breath sounds clear to auscultation       Cardiovascular hypertension, Pt. on medications (-) angina+ Peripheral Vascular Disease (AAA 3.1cm)   Rhythm:Regular Rate:Normal  '19 Echo: EF 45% to 50%. Wall motion was normal; there were no regional wall motion abnormalities, mild MR, mild TR   Neuro/Psych negative neurological ROS     GI/Hepatic (+)     substance abuse  cocaine use, Pancreatic cancer   Endo/Other  diabetes (glu 76), Insulin Dependent, Oral Hypoglycemic Agents  Renal/GU Renal InsufficiencyRenal disease     Musculoskeletal   Abdominal   Peds  Hematology  (+) Blood dyscrasia (Hb 10.6), anemia ,   Anesthesia Other Findings   Reproductive/Obstetrics                            Anesthesia Physical Anesthesia Plan  ASA: III  Anesthesia Plan: General   Post-op Pain Management:    Induction: Intravenous  PONV Risk Score and Plan: 1 and 2 and Ondansetron and Dexamethasone  Airway Management Planned: Oral ETT  Additional Equipment:   Intra-op Plan:   Post-operative Plan: Extubation in OR  Informed Consent: I have reviewed the patients History and Physical, chart, labs and discussed the procedure including the risks, benefits and alternatives for the proposed anesthesia with the patient or authorized representative who has indicated his/her understanding and acceptance.     Dental advisory given  Plan Discussed with: CRNA and Surgeon  Anesthesia Plan Comments: (Plan routine  monitors, GETA)        Anesthesia Quick Evaluation

## 2018-11-04 NOTE — Progress Notes (Signed)
It sounds like he might be going to surgery today.  Hopefully, [name] will go to surgery today.  Again, I believe that this is the best way for Ryan Medina to help his quality of life so he will be able to get better nutrition and then we can embark upon systemic therapy.  His labs look okay.  His creatinine is 0.42.  Potassium 4.1.  Calcium is 7.9 with an albumin of 1.5.  His white cell count is 9.7 with an hemoglobin of 10.6.  Platelet count 329,000.  He is not complaining of any pain.  He is ambulatory.  He is still on the heparin drip.  There is been no bleeding.  He has had no cough or shortness of breath.  He has had no change in bowel or bladder habits.  There really is no change in his physical exam.  Hopefully, [name] will have his surgery today.  Hopefully this tumor will be bypassed.  I sincerely hope that while in the abdomen for surgery, that a biopsy will be obtained.  This will help Ryan Medina out so we can send off studies for molecular analysis.  This will help Ryan Medina determine what the best protocol would be for systemic therapy.  We will pray hard that [name] will have his surgery.  Hopefully, surgery will go well and that the tumor obstruction will be bypassed.  Biopsies will hopefully be obtained.  I appreciate the efforts by the surgeons.  I know this is probably not a simple case by any means.  [name], MD

## 2018-11-04 NOTE — Progress Notes (Addendum)
[place] for Heparin - no bolus Indication: PE  Allergies  Allergen Reactions  . Lisinopril Anaphylaxis    Patient Measurements: Height: 5\' 11"  (180.3 cm) Weight: 124 lb (56.2 kg) IBW/kg (Calculated) : 75.3 Heparin Dosing Weight: 66.7  Vital Signs: Temp: 98.6 F (37 C) (02/13 0517) Temp Source: Oral (02/13 0517) BP: 125/100 (02/13 0517) Pulse Rate: 90 (02/13 0517)  Labs: Recent Labs    11/01/18 0921  11/02/18 0451 11/02/18 0832 11/03/18 0444 11/04/18 0344  HGB  --    < > 7.5*  --  10.5* 10.6*  HCT  --   --  24.6*  --  33.5* 32.7*  PLT  --   --  320  --  332 329  APTT 115*  --   --   --   --   --   LABPROT  --   --  16.2*  --   --   --   INR  --   --  1.31  --   --   --   HEPARINUNFRC  --    < > 1.42* 0.47 0.36 0.35  CREATININE  --   --  0.36*  --  0.45* 0.42*   < > = values in this interval not displayed.   Estimated Creatinine Clearance: 81 mL/min (A) (by C-G formula based on SCr of 0.42 mg/dL (L)).  Assessment: 58 yo male with arterial embolus s/p embolectomy. Patient had development of hematoma with compartment syndrome as well as hemorrhagic shock secondary to a bleeding duodenal ulcer.   Events this admission on 09/03/18:  LLE DVT/ischemia s/p urgent left popliteal artery embolectomy 12/13 >> RLE hematoma on 12/15 am s/p evacuation in OR.Heparin stopped 2/2 drop in hgb, hematemesis. -Heparin cautiously started 12/22,had few episodes of hematemesis & nose bleed 12/24> stopped heparin per dr. Candiss Norse S/p EUS 12/26:  pancreatic mass eroding into duodenum -VVS notes ikely too high risk for [facility]  12/28- Dr. Candiss Norse noted Continue PPI and Hold Heparin indefinitely  2/5: pharmacy consulted to dose heparin with no bolus by Dr Marin Olp for acute PE. Previously on 12/24 his heparin level was therapeutic at 0.35 on 1200 units/hr. 2.7: Heparin off at 09 am for EDG, heparin to resume after NG placed per GI instructions. KUB 1629> NG in  stomach.   11/04/2018 Heparin level therapeutic at 0.35 on 1600 units/hr. Hg 10.6 after 2 U PRBCs given on 2/11. PLTC WNL No bleeding reported. CCS consulted for possible palliative bypass procedure for relief of gastric outlet obstruction. CCS rec gastrostomy tube and feeding jejunostomy.  INR 1.31 on 2/11> has been elevated entire admission, as high at 1.5 on 09/27/18. Not on coumadin.  Heparin drip stopped at 5 am per CCS request for possible OR  Goal of Therapy:  Heparin level 0.3-0.5 units/mL Monitor platelets by anticoagulation protocol: Yes     Plan:  Heparin drip stopped at 05 am per CCS orders- assume heparin will be resumed if surgery cancelled  F/u plans for surgery   Daily HL and CBC while on heparin  Eudelia Bunch, Pharm.D [phone] 11/04/2018 8:07 AM   Addendum: S/p OR.  Heparin to be resumed tonight at 2000 with NO BOLUS.  Plan:  resume heparin tonight at 2000 at prior rate of 1600 units/hr Daily HL and CBC   Eudelia Bunch, Pharm.D [phone] 11/04/2018 12:24 PM

## 2018-11-04 NOTE — Anesthesia Postprocedure Evaluation (Signed)
Anesthesia Post Note  Patient: Ryan Medina  Procedure(s) Performed: LAPAROSCOPY   EXPLORATORY LAPAROTOMY GASTROJEJUNOSTOMY (N/A Abdomen) GASTROJEJUNOSTOMY (N/A )     Patient location during evaluation: PACU Anesthesia Type: General Level of consciousness: awake and alert, patient cooperative and oriented Pain management: pain level controlled Vital Signs Assessment: post-procedure vital signs reviewed and stable Respiratory status: spontaneous breathing, nonlabored ventilation, respiratory function stable and patient connected to nasal cannula oxygen Cardiovascular status: blood pressure returned to baseline and stable Postop Assessment: no apparent nausea or vomiting Anesthetic complications: no    Last Vitals:  Vitals:   11/04/18 1215 11/04/18 1301  BP: (!) 120/93 (!) 87/70  Pulse: 95 94  Resp: 15 17  Temp: 37 C 36.6 C  SpO2: 96% 97%    Last Pain:  Vitals:   11/04/18 1301  TempSrc: Oral  PainSc:                  Absalom Aro,E. Zadie Deemer

## 2018-11-04 NOTE — Progress Notes (Signed)
PROGRESS NOTE    Ryan Medina  IPJ:[id number] DOB: 03/17/1961 DOA: 09/03/2018 PCP: Patient, No Pcp Per  Brief Narrative: :58 year old with history of peripheral vascular disease, dilated cardiomyopathy, essential hypertension, diabetes mellitus type 2 initially came to the hospital with right lower extremity pain and was found to have ischemic leg requiring embolectomy on 12/13. He was placed on heparin drip subsequently developed right lower extremity hematoma requiring fasciotomy on 12/15. Unfortunately afterwards he decompensated with active GI bleed and was intubated and was moved to the ICU. He required embolization on 12/17. During the course of his hospitalization he is required total of 26 units of blood. Further evaluation showed pancreatic cancer with duodenal ulcer. Transferred to [place] long for radiation treatment to help with bleeding. Hospital course complicated by sepsis from healthcare acquired pneumonia and Pseudomonas UTI requiring treatment with vancomycin and cefepime.Patient found to have chronic PE therefore started on heparin drip per oncology. Repeat endoscopy showed worsening of gastric outlet obstruction. Initially NG tube placed by patient removed it. Right now on clear liquid diet.   Assessment & Plan:   Principal Problem:   Popliteal artery embolus (HCC) Active Problems:   DM (diabetes mellitus), type 2, uncontrolled ([place])   Essential hypertension   Dilated cardiomyopathy (HCC)   Chest pain   AAA (abdominal aortic aneurysm) (HCC)   Ischemic foot   Right leg claudication (HCC)   PVD (peripheral vascular disease) ([place])   Hemorrhagic shock ([place])   Nontraumatic ischemic infarction of muscle of right lower leg   Upper GI bleed   Cocaine abuse ([place])   Acute blood loss anemia   Chronic pain syndrome   Duodenal ulcer with hemorrhage   Pancreatic cancer ([place])   Goals of care, counseling/discussion   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 Status post radiation. Per oncology plans for outpatient radiation and chemotherapy -Status post endoscopy 12/17, GDA embolization 12/17, status post radiation. -Continue PPI. -Endoscopy showed worsening of gastric outlet obstruction/pyloric stenosis secondary to malignancy. Candida esophagitis -S/P laparoscopy open gastrojejunostomy no pancreatic mass amenable to biopsy  Candida esophagitis -Currently on Diflucan for total of 2 weeks. Day 5 today.  Chronic bilateral pulmonary embolism -Heparin drip per oncology  Anemia of chronic disease Status post multiple blood transfusions  Hypokalemia/hypomagnesemia -Repletion ordered. Sepsis secondary to H CAP and Pseudomonas urinary tract infection -Status post treatment with vancomycin and cefepime. PICC line pulled out 1/26.  Ischemic right foot with right popliteal and tibial artery embolus -Status post embolectomy. Complicated by compartment syndrome requiring fasciotomy. -Low-dose heparin started. Closely monitor for any bleeding. -Lower extremity routine dressing, follow-up outpatient with vascular  Cardiomyopathy, combined systolic and diastolic CHF, chronic -Patient appears to be euvolemic in nature. Continue to provide supportive care.  Uncontrolled diabetes mellitus type 2 -Hemoglobin A1c 18.2. Continue Lantus, Accu-Chek and  sliding scale. Blood glucose in acceptable range  Severe protein calorie malnutrition -Protein supplement ordered  Generalized weakness-physical therapy/Occupational Therapy-recommending skilled nursing facility Nutrition Problem:  Severe Malnutrition Etiology: chronic illness(Pancreatic cancer, DM and CHF)   Pressure Injury 11/03/18 Stage II -  Partial thickness loss of dermis presenting as a shallow open ulcer with a red, pink wound bed without slough. granulation, no drainage noted (Active)  11/03/18 0820  Location: Coccyx  Location Orientation: Mid;Upper  Staging: Stage II -  Partial thickness loss of dermis presenting as a shallow open ulcer with a red, pink wound bed without slough.  Wound Description (Comments): granulation, no drainage noted  Present on Admission: No      Nutrition Problem: Severe Malnutrition Etiology: chronic illness(Pancreatic cancer, DM and CHF)     Signs/Symptoms: severe muscle depletion, severe fat depletion, percent weight loss Percent weight loss: 23 %    Interventions: Ensure Enlive (each supplement provides 350kcal and 20 grams of protein)  Estimated body mass index is 17.29 kg/m as calculated from the following:   Height as of this encounter: 5\' 11"  (1.803 m).   Weight as of this encounter: 56.2 kg.  DVT prophylaxis: Heparin drip Code Status: Okay for intubation partial code Family Communication: No family available Disposition Plan:  Unknown at this time   Consultants: Gastroenterology  Oncology  Interventional radiology  Critical care  Vascular surgery  Palliative care  General surgery Cardiology  Procedures: Endoscopy 12/17 EUS 12/26 Chemo-Port placement 10/12/2018 repeat endoscopy 2/ 7  antimicrobials Finished course of vancomycin and cefepime Subjective: Patient resting in bed anxious about the procedure  Objective: Vitals:   11/03/18 2108 11/04/18 0517 11/04/18 0829 11/04/18 0831  BP: (!) 126/92 (!)  125/100 114/86   Pulse: 95 90 89   Resp: 16 18 16    Temp: 99.4 F (37.4 C) 98.6 F (37 C) 98.2 F (36.8 C)   TempSrc: Oral Oral Oral   SpO2: 96% 96% 98%   Weight:    56.2 kg  Height:    5\' 11"  (1.803 m)    Intake/Output Summary (Last 24 hours) at 11/04/2018 1134 Last data filed at 11/04/2018 1104 Gross per 24 hour  Intake 2957.11 ml  Output 2325 ml  Net 632.11 ml   Filed Weights   10/29/18 1219 11/02/18 1806 11/04/18 0831  Weight: 54.7 kg 56.2 kg 56.2 kg    Examination:  General exam: Appears calm and comfortable  Respiratory system: Clear to auscultation. Respiratory effort normal. Cardiovascular system: S1 & S2 heard, RRR. No JVD, murmurs, rubs, gallops or clicks. No pedal edema. Gastrointestinal system: Abdomen is nondistended, soft and nontender. No organomegaly or masses felt. Normal bowel sounds heard. Central nervous system: Alert and oriented. No focal neurological deficits. Extremities: Symmetric 5 x 5 power. Skin: No rashes, lesions or ulcers Psychiatry: Judgement and insight appear normal. Mood & affect appropriate.     Data Reviewed: I have personally reviewed following labs and imaging studies  CBC: Recent Labs  Lab 10/29/18 0608 10/30/18 0626 10/31/18 0503 11/01/18 0530 11/02/18 0451 11/03/18 0444 11/04/18 0344  WBC 9.5 8.9 8.1 7.2 7.9 9.3 9.7  NEUTROABS 7.9* 7.4 6.4 5.7 6.3  --   --   HGB 8.4* 9.6* 8.1* 7.9* 7.5* 10.5* 10.6*  HCT 27.5* 31.0* 25.9* 25.8* 24.6* 33.5* 32.7*  MCV 88.1 89.1 89.9 90.2 89.1 89.5 87.9  PLT 341 414* 337 365 320 332 287   Basic Metabolic Panel: Recent Labs  Lab 10/31/18 0503 11/01/18 0530 11/02/18 0451 11/03/18 0444 11/04/18 0344  NA 135 137 133* 135 135  K 3.3* 3.8 3.0* 3.5 4.1  CL 99 101 98 99 99  CO2  28 27 26 28 26   GLUCOSE 227* 184* 205* 137* 104*  BUN <5* <5* <5* 5* 6  CREATININE 0.42* 0.42* 0.36* 0.45* 0.42*  CALCIUM 7.6* 7.8* 7.4* 7.9* 7.9*  MG  --  1.7 1.5* 1.7 1.7   GFR: Estimated Creatinine  Clearance: 81 mL/min (A) (by C-G formula based on SCr of 0.42 mg/dL (L)). Liver Function Tests: Recent Labs  Lab 10/31/18 0503 11/01/18 0530 11/02/18 0451 11/03/18 0444 11/04/18 0344  AST 12* 12* 16 17 15   ALT 13 13 15 17 17   ALKPHOS 65 63 64 64 69  BILITOT 0.4 0.4 0.4 0.6 0.3  PROT 6.1* 6.0* 6.0* 6.2* 6.6  ALBUMIN 1.4* 1.4* 1.4* 1.6* 1.5*   No results for input(s): LIPASE, AMYLASE in the last 168 hours. No results for input(s): AMMONIA in the last 168 hours. Coagulation Profile: Recent Labs  Lab 11/02/18 0451  INR 1.31   Cardiac Enzymes: No results for input(s): CKTOTAL, CKMB, CKMBINDEX, TROPONINI in the last 168 hours. BNP (last 3 results) No results for input(s): PROBNP in the last 8760 hours. HbA1C: No results for input(s): HGBA1C in the last 72 hours. CBG: Recent Labs  Lab 11/03/18 2014 11/03/18 2357 11/04/18 0440 11/04/18 0737 11/04/18 0831  GLUCAP 137* 131* 126* 76 78   Lipid Profile: No results for input(s): CHOL, HDL, LDLCALC, TRIG, CHOLHDL, LDLDIRECT in the last 72 hours. Thyroid Function Tests: No results for input(s): TSH, T4TOTAL, FREET4, T3FREE, THYROIDAB in the last 72 hours. Anemia Panel: No results for input(s): VITAMINB12, FOLATE, FERRITIN, TIBC, IRON, RETICCTPCT in the last 72 hours. Sepsis Labs: No results for input(s): PROCALCITON, LATICACIDVEN in the last 168 hours.  No results found for this or any previous visit (from the past 240 hour(s)).       Radiology Studies: No results found.      Scheduled Meds: . [MAR Hold] atorvastatin  20 mg Oral QHS  . [MAR Hold] carvedilol  3.125 mg Oral BID WC  . [MAR Hold] dronabinol  5 mg Oral BID AC  . [MAR Hold] feeding supplement (ENSURE ENLIVE)  237 mL Oral QID  . [MAR Hold] fluconazole  100 mg Oral Daily  . [MAR Hold] gabapentin  400 mg Oral TID PC & HS  . heparin lock flush  500 Units Intracatheter Q30 days  . [MAR Hold] insulin aspart  0-9 Units Subcutaneous Q4H  . [MAR Hold] insulin  detemir  4 Units Subcutaneous Daily  . [MAR Hold] morphine  15 mg Oral Q12H   Continuous Infusions: . 0.9 % NaCl with KCl 40 mEq / L Stopped (11/04/18 0903)  . dextrose 5 % and 0.45% NaCl 75 mL/hr at 11/03/18 1040  . lactated ringers 50 mL/hr at 11/04/18 4709     LOS: 32 days     Georgette Shell, MD [facility] If 7PM-7AM, please contact night-coverage [email or web address] Password [facility] 11/04/2018, 11:34 AM

## 2018-11-04 NOTE — Op Note (Signed)
Ryan Medina  01/07/1961 04 November 2018    PCP:  Patient, No Pcp Per   Surgeon: Kaylyn Lim, MD, FACS  Asst:  Brigid Re, PA  Anes:  general  Preop Dx: Pancreatic cancer with partial duodenal obstruction Postop Dx: Same with massive gastric varices;  No pancreatic mass amenable to biopsy.   Procedure: Laparoscopy, Open gastrojejunostomy Location Surgery: [facility] 4 Complications: Optiview into stomach   EBL:   20 cc  Drains: none  Description of Procedure:  The patient was taken to OR 4 .  After anesthesia was administered and the patient was prepped  with [employer] and a timeout was performed.  Access to the abdomen was attempted through the left upper quadrant.  Despite an NG tube, the stomach was entered with the trocar and the gastric contents were evacuated.  The abdomen was opened through the upper midline.  The stomach was surrounded by large, ropey varices.  The site of the trocar was visualized and a purse string suture of 2-0 silk placed about it and it was withdrawn without spillage.    The ligament of Treitz was identified and about 30 cm distally the limbs were brought up with the afferent limb going left and the efferent limb going right.  The small bowel was brought up beside the posterior gastric wall after creating a window into the lessor sac.  The purple load Covidien stapler, 6 cm was passed into the stomach and small bowel and was fired.  The common channel was not bleeding.  The common opening was closed with 4-0 PDS in a [facility] fashion and with interrupted 2-0 silk.    The fascia was closed with interrupted 0 Novafil with inversion of the knots.    The patient tolerated the procedure well and was taken to the PACU in stable condition.     Matt B. Hassell Done, [place], [facility], [place]

## 2018-11-04 NOTE — Progress Notes (Signed)
PT Cancellation Note  Patient Details Name: Ryan Medina MRN: [id number] DOB: 1961/01/31   Cancelled Treatment:    Reason Eval/Treat Not Completed: Patient declined, no reason specified;Fatigue/lethargy limiting ability to participate - pt got GJ tube today, and is feeling fatigued/not up for PT. PT to continue to follow, as pt requesting to be seen tomorrow.  Julien Girt, PT Acute Rehabilitation Services Pager [phone]  Office [phone]    Roxine Caddy D Elonda Husky 11/04/2018, 5:14 PM

## 2018-11-05 ENCOUNTER — Inpatient Hospital Stay: Payer: Self-pay

## 2018-11-05 LAB — GLUCOSE, CAPILLARY
Glucose-Capillary: 204 mg/dL — ABNORMAL HIGH (ref 70–99)
Glucose-Capillary: 178 mg/dL — ABNORMAL HIGH (ref 70–99)
Glucose-Capillary: 144 mg/dL — ABNORMAL HIGH (ref 70–99)
Glucose-Capillary: 56 mg/dL — ABNORMAL LOW (ref 70–99)
Glucose-Capillary: 85 mg/dL (ref 70–99)
Glucose-Capillary: 90 mg/dL (ref 70–99)

## 2018-11-05 LAB — HEPARIN LEVEL (UNFRACTIONATED): Heparin Unfractionated: 0.13 IU/mL — ABNORMAL LOW (ref 0.30–0.70)

## 2018-11-05 LAB — BASIC METABOLIC PANEL
Anion gap: 11 (ref 5–15)
BUN: 14 mg/dL (ref 6–20)
CO2: 21 mmol/L — ABNORMAL LOW (ref 22–32)
CREATININE: 0.51 mg/dL — AB (ref 0.61–1.24)
Calcium: 8.2 mg/dL — ABNORMAL LOW (ref 8.9–10.3)
Chloride: 100 mmol/L (ref 98–111)
GFR calc Af Amer: >60 mL/min (ref >60)
GFR calc non Af Amer: >60 mL/min (ref >60)
Glucose, Bld: 195 mg/dL — ABNORMAL HIGH (ref 70–99)
Potassium: 3.7 mmol/L (ref 3.5–5.1)
Sodium: 132 mmol/L — ABNORMAL LOW (ref 135–145)

## 2018-11-05 LAB — MAGNESIUM: Magnesium: 1.5 mg/dL — ABNORMAL LOW (ref 1.7–2.4)

## 2018-11-05 MED ORDER — MAGNESIUM SULFATE 2 GM/50ML IV SOLN
2.0000 g | Freq: Once | INTRAVENOUS | Status: AC
  Administered 2018-11-05: 2 g via INTRAVENOUS
  Filled 2018-11-05: qty 50

## 2018-11-05 MED ORDER — TRAVASOL 10 % IV SOLN
INTRAVENOUS | Status: AC
  Administered 2018-11-05: 18:00:00 via INTRAVENOUS
  Filled 2018-11-05: qty 480

## 2018-11-05 MED ORDER — DEXTROSE-NACL 5-0.45 % IV SOLN
INTRAVENOUS | Status: AC
  Administered 2018-11-05: 18:00:00 via INTRAVENOUS

## 2018-11-05 MED ORDER — DEXTROSE 50 % IV SOLN
12.5000 g | INTRAVENOUS | Status: AC
  Administered 2018-11-05: 12.5 g via INTRAVENOUS

## 2018-11-05 MED ORDER — ENOXAPARIN SODIUM 60 MG/0.6ML ~~LOC~~ SOLN
60.0000 mg | Freq: Two times a day (BID) | SUBCUTANEOUS | Status: DC
  Administered 2018-11-05 – 2018-11-07 (×6): 60 mg via SUBCUTANEOUS
  Filled 2018-11-05 (×6): qty 0.6

## 2018-11-05 MED ORDER — DEXTROSE 50 % IV SOLN
INTRAVENOUS | Status: AC
  Filled 2018-11-05: qty 50

## 2018-11-05 NOTE — Progress Notes (Signed)
Patient was taken off Heparin drip & placed on Lovenox injection. Suggested to use port for TPN and peripheral IV for Robaxin & fluids. Spoke with Dr. Rodena Piety and she agreed. PICC line insertion d/c.

## 2018-11-05 NOTE — Progress Notes (Signed)
1 Day Post-Op    CC: Nonresectable pancreatic cancer with gastric outlet syndrome  Subjective: Patient seems fairly comfortable in bed wants to know when he can eat.  Few bowel sounds no flatus so far.  Midline incision looks fine.  Objective: Vital signs in last 24 hours: Temp:  [97.7 F (36.5 C)-98.9 F (37.2 C)] 98.1 F (36.7 C) (02/14 2536) Pulse Rate:  [86-100] 92 (02/14 0633) Resp:  [13-20] 19 (02/14 0633) BP: (87-128)/(70-102) 118/90 (02/14 0633) SpO2:  [96 %-100 %] 97 % (02/14 6440) Last BM Date: 11/01/18 2301 IV 1100 urine Stool x1 Afebrile pressure was down some yesterday normal this a.m. NA 132 glucose 195 Magnesium 1.5 Intake/Output from previous day: 02/13 0701 - 02/14 0700 In: 2301.4 [I.V.:2001.4; IV Piggyback:300] Out: 3474 [Urine:1100; Blood:75] Intake/Output this shift: No intake/output data recorded.  General appearance: alert, cooperative and no distress Resp: clear to auscultation bilaterally GI: tender, distended, sore, rare BS. No flatus or BM  Lab Results:  Recent Labs    11/03/18 0444 11/04/18 0344  WBC 9.3 9.7  HGB 10.5* 10.6*  HCT 33.5* 32.7*  PLT 332 329    BMET Recent Labs    11/04/18 0344 11/05/18 0814  NA 135 132*  K 4.1 3.7  CL 99 100  CO2 26 21*  GLUCOSE 104* 195*  BUN 6 14  CREATININE 0.42* 0.51*  CALCIUM 7.9* 8.2*   PT/INR No results for input(s): LABPROT, INR in the last 72 hours.  Recent Labs  Lab 10/31/18 0503 11/01/18 0530 11/02/18 0451 11/03/18 0444 11/04/18 0344  AST 12* 12* 16 17 15   ALT 13 13 15 17 17   ALKPHOS 65 63 64 64 69  BILITOT 0.4 0.4 0.4 0.6 0.3  PROT 6.1* 6.0* 6.0* 6.2* 6.6  ALBUMIN 1.4* 1.4* 1.4* 1.6* 1.5*     Lipase     Component Value Date/Time   LIPASE 42 08/22/2018 1932     Medications: . heparin lock flush  500 Units Intracatheter Q30 days  . insulin aspart  0-9 Units Subcutaneous Q4H  . insulin detemir  4 Units Subcutaneous Daily   . dextrose 5 % and 0.45% NaCl 75 mL/hr  at 11/05/18 0353  . heparin 1,600 Units/hr (11/04/18 2048)  . 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 Chronic pain/hx of substance abuse -ETOH/Cocaine/tobacco Anemia H/H: 7.9/25.8 Hx exploratory laparotomy,closure of pyloric channel ulcer with Phillip Heal patch 12/2003 Dr. Harlow Asa Cocaine positive 09/27/18 Hypokalemia/hypomagnesemia Severe malnutrition-prealbumin 5.1   Hemorrhagic shock secondary to duodenalulcer-GI bleed/embolization 09/07/2018 with radiation therapy/chemotherapy pending Nonresectable pancreatic cancer with gastric outlet syndrome/liver metastasis Laparoscopy with open gastrojejunostomy 11/04/2018, Dr. Johnathan Hausen  FEN:IV fluids/clear liquids ID: Diflucan 2/7>>2/12 (candida esophagitis) cefotetan preop 11/04/2018 DVT: SCD/heparin drip Follow up: TBD   Plan:  PICC line and TPN until he can eat.  Await return of bowel function, replace Mag.    LOS: 63 days    Jerrica Thorman 11/05/2018 [phone]

## 2018-11-05 NOTE — Progress Notes (Signed)
Nutrition Follow-up  DOCUMENTATION CODES:   Severe malnutrition in context of chronic illness, Underweight  INTERVENTION:   Monitor magnesium, potassium, and phosphorus daily for at least 3 days, MD to replete as needed, as pt is at risk for refeeding syndrome given poor PO intakes, severe malnutrition, and Low Mg levels.  -TPN per Pharmacy -Once diet is advanced, would resume Ensure Enlive po TID, each supplement provides 350 kcal and 20 grams of protein  NUTRITION DIAGNOSIS:   Severe Malnutrition related to chronic illness(Pancreatic cancer, DM and CHF) as evidenced by severe muscle depletion, severe fat depletion, percent weight loss.  Ongoing.  GOAL:   Patient will meet greater than or equal to 90% of their needs  Not meeting.  MONITOR:   PO intake, Supplement acceptance, Weight trends, Labs  REASON FOR ASSESSMENT:   Consult New TPN/TNA  ASSESSMENT:   58 y.o male with PMH: recent dx pancreatic cancer, DM, CHF, crack cocaine use, emboli of legs, smoker. Pt admitted with admitted with DKA and GI bleed.   Significant events at [facility]: 12/13 embolectomy surgery for acute popliteal and tibial emboli 12/15 fasciotomy for RLE hematoma  12/17 Developed hemorrhagic shock secondary to bleeding duodenal ulcer requiring transfer to ICU for intubation 12/22 MRI showed pancreatic mass 12/26 Endoscopic ultrasound by Gi showed adenocarcinoma 1/5- Transferred back to ICU with hypotension anemia and GI bleed from invading pancreatic mass  [facility]: 1/7- Transferred to [facility] for radiation 1/22 -completed course of radiation 2/5: CT scan reveals enlargement of pancreatic mass, dilated stomach which is fluid and food filled 2/7 s/p EGD -tumor occluding the gastric outlet at the level of the pylorus.  2/8 -NGT pulled out, started on CLD 2/11: [place] Count completed, pt meeting 65-69% of estimated needs 2/13: LAPAROSCOPY   EXPLORATORY LAPAROTOMY GASTROJEJUNOSTOMY   Patient is now to have  PICC placed and TPN initiated. Per discussion with Pharmacy, surgery does not want J-tube used at this time. Pt is NPO.  Per Pharmacy note, TPN will start at 40 ml/hr (providing 969 kcal and 48g protein). Pt is at refeeding syndrome risk.  Medications: D5 -.45% NaCl infusion at 45 ml/hr, IV Mg sulfate Labs reviewed: CBGs: 144-178 Low Na, Mg  Diet Order:   Diet Order            Diet NPO time specified Except for: Ice Chips  Diet effective midnight              EDUCATION NEEDS:   Not appropriate for education at this time  Skin:  Skin Assessment: Skin Integrity Issues: Skin Integrity Issues:: Stage II Stage II: coccyx  Last BM:  2/10  Height:   Ht Readings from Last 1 Encounters:  11/04/18 5\' 11"  (1.803 m)    Weight:   Wt Readings from Last 1 Encounters:  11/04/18 56.2 kg    Ideal Body Weight:  78 kg  BMI:  Body mass index is 17.29 kg/m.  Estimated Nutritional Needs:   Kcal:  2000-2200 kcal  Protein:  100-110 grams  Fluid:  >/= 2L/day  Clayton Bibles, MS, RD, LDN [facility] Inpatient Clinical Dietitian Pager: [phone] After Hours Pager: [phone]

## 2018-11-05 NOTE — Progress Notes (Signed)
PROGRESS NOTE    Ryan Medina  IPJ:[id number] DOB: 1961/03/18 DOA: 09/03/2018 PCP: Patient, No Pcp Per   Brief Narrative: :58 year old with history of peripheral vascular disease, dilated cardiomyopathy, essential hypertension, diabetes mellitus type 2 initially came to the hospital with right lower extremity pain and was found to have ischemic leg requiring embolectomy on 12/13. He was placed on heparin drip subsequently developed right lower extremity hematoma requiring fasciotomy on 12/15. Unfortunately afterwards he decompensated with active GI bleed and was intubated and was moved to the ICU. He required embolization on 12/17. During the course of his hospitalization he is required total of 26 units of blood. Further evaluation showed pancreatic cancer with duodenal ulcer. Transferred to [facility] for radiation treatment to help with bleeding. Hospital course complicated by sepsis from healthcare acquired pneumonia and Pseudomonas UTI requiring treatment with vancomycin and cefepime.Patient found to have chronic PE therefore started on heparin drip per oncology. Repeat endoscopy showed worsening of gastric outlet obstruction. Initially NG tube placed by patient removed it. Right now on clear liquid diet.   Assessment & Plan:   Principal Problem:   Popliteal artery embolus (HCC) Active Problems:   DM (diabetes mellitus), type 2, uncontrolled ([place])   Essential hypertension   Dilated cardiomyopathy (HCC)   Chest pain   AAA (abdominal aortic aneurysm) (HCC)   Ischemic foot   Right leg claudication (HCC)   PVD (peripheral vascular disease) ([place])   Hemorrhagic shock ([place])   Nontraumatic ischemic infarction of muscle of right lower leg   Upper GI bleed   Cocaine abuse ([place])   Acute blood loss anemia   Chronic pain syndrome   Duodenal ulcer with hemorrhage   Pancreatic cancer ([place])   Goals of care, counseling/discussion   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 Status post radiation. Per oncology plans for outpatient radiation and chemotherapy -Status post endoscopy 12/17, GDA embolization 12/17, status post radiation. -Continue PPI. -Endoscopy showed worsening of gastric outlet obstruction/pyloric stenosis secondary to malignancy. Candida esophagitis -S/P laparoscopy open gastrojejunostomy no pancreatic mass amenable to biopsy -Surgery starting TPN and ordered PICC line.  Candida esophagitis -Currently on Diflucan for total of 2 weeks. Day5today.  Chronic bilateral pulmonary embolism -Heparin drip per oncology,DEFER TO ONC ABOUT starting oral anticoagulation since he is done with surgery.  Anemia of chronic disease Status post multiple blood transfusions  Hypokalemia/hypomagnesemia -Repletion ordered. Sepsis secondary to H CAP and Pseudomonas urinary tract infection -Status post treatment with vancomycin and cefepime. PICC line pulled out 1/26. Ischemic right foot with right popliteal and tibial artery embolus -Status post embolectomy. Complicated by compartment syndrome requiring fasciotomy. -Low-dose heparin started. Closely monitor for any bleeding. -Lower extremity routine dressing, follow-up outpatient with vascular  Cardiomyopathy, combined systolic and diastolic CHF, chronic -Patient appears to be euvolemic in nature. Continue to  provide supportive care.  Uncontrolled diabetes mellitus type 2 -Hemoglobin A1c 18.2. Continue Lantus, Accu-Chek and sliding scale. Blood glucose in acceptable range  Severe protein calorie malnutrition -Protein supplement ordered  Generalized weakness-physical therapy/Occupational Therapy-recommending skilled nursing facility Nutrition Problem: Severe Malnutrition Etiology: chronic illness(Pancreatic cancer, DM and CHF) DVT prophylaxis:Heparin drip Code Status:Okay for intubation partial code Family Communication:No family available Disposition Plan: Unknown at this time   Consultants:Gastroenterology  Oncology  Interventional radiology  Critical care  Vascular surgery  Palliative care  General surgery Cardiology  Procedures:Endoscopy 12/17 EUS 12/26 Chemo-Port placement 1/21/2020repeat endoscopy 2/7  antimicrobials Finished course of vancomycin and cefepime  Pressure Injury 11/03/18 Stage II -  Partial thickness loss of dermis presenting as a shallow open ulcer with a red, pink wound bed without slough. granulation, no drainage noted (Active)  11/03/18 0820  Location: Coccyx  Location Orientation: Mid;Upper  Staging: Stage II -  Partial thickness loss of dermis presenting as a shallow open ulcer with a red, pink wound bed without slough.  Wound Description (Comments): granulation, no drainage noted  Present on Admission: No      Nutrition Problem: Severe Malnutrition Etiology: chronic illness(Pancreatic cancer, DM and CHF)     Signs/Symptoms: severe muscle depletion, severe fat depletion, percent weight loss Percent weight loss: 23 %    Interventions: Ensure Enlive (each supplement provides 350kcal and 20 grams of protein)  Estimated body mass index is 17.29 kg/m as calculated from the following:   Height as of this encounter: 5\' 11"  (1.803 m).   Weight as of this encounter: 56.2 kg.   Subjective: Patient resting in bed nurse  tech reports he had a bowel movement last night after surgery.  Patient denies having any flatus.  Objective: Vitals:   11/04/18 2011 11/05/18 0051 11/05/18 0633 11/05/18 0956  BP: (!) 121/98 122/90 118/90 (!) 121/96  Pulse: 87 86 92 85  Resp: 20 19 19 16   Temp: 98.5 F (36.9 C) 98.2 F (36.8 C) 98.1 F (36.7 C) 98.3 F (36.8 C)  TempSrc: Oral Oral Oral Oral  SpO2: 98% 99% 97% 98%  Weight:      Height:        Intake/Output Summary (Last 24 hours) at 11/05/2018 1047 Last data filed at 11/05/2018 0318 Gross per 24 hour  Intake 2051.39 ml  Output 975 ml  Net 1076.39 ml   Filed Weights   10/29/18 1219 11/02/18 1806 11/04/18 0831  Weight: 54.7 kg 56.2 kg 56.2 kg    Examination:  General exam: Appears calm and comfortable  Respiratory system: Clear to auscultation. Respiratory effort normal. Cardiovascular system: S1 & S2 heard, RRR. No JVD, murmurs, rubs, gallops or clicks. No pedal edema. Gastrointestinal system: Abdomen is nondistended, soft and nontender. No organomegaly or masses felt NO  bowel sounds heard. Central nervous system: Alert and oriented. No focal neurological deficits. Extremities: Symmetric 5 x 5 power. Skin: No rashes, lesions or ulcers Psychiatry: Judgement and insight appear normal. Mood & affect appropriate.     Data Reviewed: I have personally reviewed following labs and imaging studies  CBC: Recent Labs  Lab 10/30/18 0626 10/31/18 0503 11/01/18 0530 11/02/18 0451 11/03/18 0444 11/04/18 0344  WBC 8.9 8.1 7.2 7.9 9.3 9.7  NEUTROABS 7.4 6.4 5.7 6.3  --   --   HGB 9.6* 8.1* 7.9* 7.5* 10.5* 10.6*  HCT 31.0* 25.9* 25.8* 24.6* 33.5* 32.7*  MCV 89.1 89.9 90.2 89.1 89.5 87.9  PLT 414* 337 365 320 332 037   Basic Metabolic Panel: Recent Labs  Lab 11/01/18 0530 11/02/18 0451 11/03/18 0444 11/04/18 0344 11/05/18 0814  NA 137 133* 135 135 132*  K 3.8 3.0* 3.5 4.1 3.7  CL 101 98 99 99 100  CO2 27 26 28 26  21*  GLUCOSE 184* 205* 137* 104*  195*  BUN <5* <5* 5* 6 14  CREATININE 0.42* 0.36* 0.45* 0.42* 0.51*  CALCIUM 7.8* 7.4* 7.9* 7.9* 8.2*  MG 1.7 1.5* 1.7 1.7 1.5*   GFR: Estimated Creatinine Clearance: 81 mL/min (A) (by C-G formula based on SCr of 0.51 mg/dL (L)). Liver Function Tests: Recent Labs  Lab 10/31/18 0503 11/01/18 0530 11/02/18 0451 11/03/18 0444 11/04/18 0344  AST 12* 12* 16 17 15   ALT 13 13 15 17 17   ALKPHOS 65 63 64 64 69  BILITOT 0.4 0.4 0.4 0.6 0.3  PROT 6.1* 6.0* 6.0* 6.2* 6.6  ALBUMIN 1.4* 1.4* 1.4* 1.6* 1.5*   No results for input(s): LIPASE, AMYLASE in the last 168 hours. No results for input(s): AMMONIA in the last 168 hours. Coagulation Profile: Recent Labs  Lab 11/02/18 0451  INR 1.31   Cardiac Enzymes: No results for input(s): CKTOTAL, CKMB, CKMBINDEX, TROPONINI in the last 168 hours. BNP (last 3 results) No results for input(s): PROBNP in the last 8760 hours. HbA1C: No results for input(s): HGBA1C in the last 72 hours. CBG: Recent Labs  Lab 11/04/18 1723 11/04/18 2014 11/04/18 2332 11/05/18 0321 11/05/18 0726  GLUCAP 135* 153* 157* 204* 178*   Lipid Profile: No results for input(s): CHOL, HDL, LDLCALC, TRIG, CHOLHDL, LDLDIRECT in the last 72 hours. Thyroid Function Tests: No results for input(s): TSH, T4TOTAL, FREET4, T3FREE, THYROIDAB in the last 72 hours. Anemia Panel: No results for input(s): VITAMINB12, FOLATE, FERRITIN, TIBC, IRON, RETICCTPCT in the last 72 hours. Sepsis Labs: No results for input(s): PROCALCITON, LATICACIDVEN in the last 168 hours.  No results found for this or any previous visit (from the past 240 hour(s)).       Radiology Studies: [place] Ekg Site Rite  Result Date: 11/05/2018 If [facility] image not attached, placement could not be confirmed due to current cardiac rhythm.       Scheduled Meds: . heparin lock flush  500 Units Intracatheter Q30 days  . insulin aspart  0-9 Units Subcutaneous Q4H  . insulin detemir  4 Units Subcutaneous  Daily   Continuous Infusions: . dextrose 5 % and 0.45% NaCl 75 mL/hr at 11/05/18 0353  . heparin 1,600 Units/hr (11/04/18 2048)  . methocarbamol (ROBAXIN) IV 500 mg (11/05/18 0750)     LOS: 18 days      Georgette Shell, MD [facility]  If 7PM-7AM, please contact night-coverage [email or web address] Password [facility] 11/05/2018, 10:47 AM

## 2018-11-05 NOTE — Progress Notes (Signed)
Physical Therapy Treatment Patient Details Name: Ryan Medina MRN: [id number] DOB: 08/16/1961 Today's Date: 11/05/2018    History of Present Illness 58 y.o. male with PMH including but not limited to DM and HTN who presented to the hospital on 12/13 with a right ischemic leg secondary to acute popliteal and tibial embolus, underwent embolectomy on 12/13 and subsequently was placed on a heparin drip.  He unfortunately developed a acute right lower leg hematoma with compartment syndrome requiring fasciotomy on 12/15.  Further hospital course was complicated by development of hemorrhagic shock with acute blood loss anemia secondary to a bleeding duodenal ulcer-requiring ICU transfer-intubation for airway protection and IR evaluation with mesenteric angiogram and GDA embolization.  Upon stability he was transferred back to the [facility] on 12/19. Pt is now s/p closure of R LE fasciotomy on 09/10/18. 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  Plans to transfer to [facility] for radiation treatment.     PT Comments    Pt with severe abdominal pain this session, defers mobility beyond bed level due to this. Pt with LE weakness and R foot pain this session, and exercise selection limited due to pt-reported pain with multiple LE movements. PT to continue to follow to progress pt's OOB mobility.    Follow Up Recommendations  SNF(pt is homeless)     Equipment Recommendations  Rolling walker with 5" wheels;3in1 (PT);Wheelchair (measurements PT);Wheelchair cushion (measurements PT)    Recommendations for Other Services       Precautions / Restrictions Precautions Precautions: Fall Precaution Comments: abdominal - pt with abdominal surgery 11/04/18  Required Braces or Orthoses: Other Brace Other Brace: CAM boot Restrictions Weight Bearing Restrictions: No RLE Weight Bearing: Weight bearing as tolerated    Mobility  Bed Mobility Overal bed mobility:  Modified Independent             General bed mobility comments: Pt able to scoot self up in bed after LE exercises. Pt defers rolling or sidelying to sit to brace abdominal precautions   Transfers Overall transfer level: (NT - pt defers OOB mobility even with PT verbal encouragement)                  Ambulation/Gait Ambulation/Gait assistance: (NT - pt defers )               Stairs             Wheelchair Mobility    Modified Rankin (Stroke Patients Only)       Balance Overall balance assessment: Needs assistance(Not assessed this session, pt defers any mobility besides bedlevel exercises)                                          Cognition Arousal/Alertness: Awake/alert Behavior During Therapy: WFL for tasks assessed/performed Overall Cognitive Status: Within Functional Limits for tasks assessed                                 General Comments: AxO x 4       Exercises General Exercises - Upper Extremity Shoulder Flexion: 10 reps;Both;Supine;15 reps Elbow Flexion: AROM;Both;15 reps;Supine General Exercises - Lower Extremity Ankle Circles/Pumps: AROM;10 reps;Supine;20 reps;Both(R ankle DF AROM -15*, AAROM -5*) Quad Sets: AROM;Both;10 reps;Supine Heel Slides: AROM;Supine;10 reps;Both Hip ABduction/ADduction: Left;5 reps;Supine;AAROM  General Comments        Pertinent Vitals/Pain Pain Assessment: Faces Faces Pain Scale: Hurts whole lot Pain Location: abdomen  Pain Descriptors / Indicators: Sore;Discomfort Pain Intervention(s): Limited activity within patient's tolerance;Repositioned;Monitored during session;Premedicated before session    Home Living                      Prior Function            PT Goals (current goals can now be found in the care plan section) Acute Rehab PT Goals Patient Stated Goal: decrease leg pain with mobility PT Goal Formulation: With patient/family Time For Goal  Achievement: 11/12/18 Potential to Achieve Goals: Fair Progress towards PT goals: Not progressing toward goals - comment(pt with abdominal surgery this week, and deferring mobility beyond bed level )    Frequency    Min 2X/week      PT Plan Current plan remains appropriate    Co-evaluation              AM-PAC PT "6 Clicks" Mobility   Outcome Measure  Help needed turning from your back to your side while in a flat bed without using bedrails?: A Little Help needed moving from lying on your back to sitting on the side of a flat bed without using bedrails?: A Little Help needed moving to and from a bed to a chair (including a wheelchair)?: A Little Help needed standing up from a chair using your arms (e.g., wheelchair or bedside chair)?: A Lot Help needed to walk in hospital room?: A Lot Help needed climbing 3-5 steps with a railing? : A Lot 6 Click Score: 15    End of Session   Activity Tolerance: Patient limited by pain Patient left: with call bell/phone within reach;in bed;with bed alarm set Nurse Communication: Mobility status PT Visit Diagnosis: Other abnormalities of gait and mobility (R26.89);Pain Pain - Right/Left: Right Pain - part of body: (abdomen )     Time: 2010-0712 PT Time Calculation (min) (ACUTE ONLY): 12 min  Charges:  $Therapeutic Exercise: 8-22 mins                    Steffie Waggoner Conception Chancy, PT Acute Rehabilitation Services Pager [phone]  Office [phone]   Jerricka Carvey D Elonda Husky 11/05/2018, 6:40 PM

## 2018-11-05 NOTE — Progress Notes (Signed)
This RN assuming care of pt as of 2300. Agree with charted assessment. Removed a bag of potato chips from bedside on initial round, will continue to monitor. Hortencia Conradi RN

## 2018-11-05 NOTE — Progress Notes (Addendum)
Insulin Requirements: sensitive SSI q4h Levemir 4 units daily  Current Nutrition: NPO  IVF: D5 & 1/2 NS at 75 ml/hr  Central access: PAC placed 1/21, PICC ordered 2/14 TPN start date: planned 2/14  ASSESSMENT                                                                                                          HPI: 40 YOM,RLE pain/numbness,DVT and limb ischemia > urgent arteriography & popliteal embolectomy (12/13 [facility]) - GOO secondary to pancreatic Ca, 2/13 G-Jejunostomy as a palliative bypass, pt removed NGT in PACU, questioned why no enteral feed per jejunostomy.  Significant events:   Today:    Glucose: (goal CBG <150 gm/dl)  Electrolytes: Na & Mag low, other lytes wnl  Renal: SCr low  LFTs:  TGs:  Prealbumin:  NUTRITIONAL GOALS                                                                                             RD recs from 2/11: Kcal 2000-220, protein 100-110 g/day  Custom TPN at goal rate of 60ml/hr provides: - 102 g/day protein ( 50  g/L) - 60 g/day Lipid  ( 30 g/L) - 300 g/day Dextrose (15 %) -  2060 Kcal/day  PLAN                                                                                                                      Mag Sulfate 2gm IVPB x1  At 1800 today:  Start TPN at 40 ml/hr. -  Provides 969 kCals, 48 gm protein per day, meeting ~ 47 % of goal kcal and goal AA  Electrolytes in TPN: Incr Na and K to 60 mEq/L, standard Ca, Mag, Phos, Cl:Ac ratio 1:2  Plan to advance as tolerated to the goal rate.  TPN to contain standard multivitamins and trace elements.  Reduce IVF to 11ml/hr.  Continue SSI and Levemir  Full labs in am  TPN lab panels on Mondays & Thursdays.  F/u daily.  Minda Ditto PharmD Pager [phone] 11/05/2018, 12:08 PM

## 2018-11-05 NOTE — Progress Notes (Signed)
Hypoglycemic Event  CBG: 56  Treatment: 25 ml/12.5 g of 50% Dextrose Solution  Symptoms: None   Follow-up CBG: Time 5:16 PM CBG Result: 85  Possible Reasons for Event: Patient is NPO  Comments/MD notified: Dr. Lelon Frohlich A Kallen Mccrystal

## 2018-11-05 NOTE — Progress Notes (Addendum)
[place] for Heparin transition to Lovenox Indication: PE, DVT  Allergies  Allergen Reactions  . Lisinopril Anaphylaxis   Patient Measurements: Height: 5\' 11"  (180.3 cm) Weight: 124 lb (56.2 kg) IBW/kg (Calculated) : 75.3 Heparin Dosing Weight: 66.7  Vital Signs: Temp: 98.1 F (36.7 C) (02/14 0633) Temp Source: Oral (02/14 0633) BP: 118/90 (02/14 6606) Pulse Rate: 92 (02/14 0633)  Labs: Recent Labs    11/03/18 0444 11/04/18 0344 11/05/18 0814  HGB 10.5* 10.6*  --   HCT 33.5* 32.7*  --   PLT 332 329  --   HEPARINUNFRC 0.36 0.35 0.13*  CREATININE 0.45* 0.42* 0.51*   Estimated Creatinine Clearance: 81 mL/min (A) (by C-G formula based on SCr of 0.51 mg/dL (L)).  Assessment: 58 yo male with arterial embolus s/p embolectomy. Patient had development of hematoma with compartment syndrome as well as hemorrhagic shock secondary to a bleeding duodenal ulcer.   Events this admission on 09/03/18:  LLE DVT/ischemia s/p urgent left popliteal artery embolectomy 12/13 >> RLE hematoma on 12/15 am s/p evacuation in OR.Heparin stopped 2/2 drop in hgb, hematemesis. -Heparin cautiously started 12/22,had few episodes of hematemesis & nose bleed 12/24> stopped heparin per dr. Candiss Norse S/p EUS 12/26:  pancreatic mass eroding into duodenum -VVS notes ikely too high risk for [facility]  12/28- Dr. Candiss Norse noted Continue PPI and Hold Heparin indefinitely  2/5: pharmacy consulted to dose heparin with no bolus by Dr Marin Olp for acute PE. Previously on 12/24 his heparin level was therapeutic at 0.35 on 1200 units/hr. 2/7: Heparin off at 09 am for EDG, heparin to resume after NG placed per GI instructions. KUB 1629> NG in stomach 2/13: Expl lap & Gastrojejunostomy- not able to use jejunostomy for enteral feeds. Heparin resumed at 2000 at rate of 1600 units/hr  Today, 11/05/2018 - Heparin level 0814 = 0.13 units/ml on 1600 units/hr, below therapeutic range - last CBC 2/13,  low but stable, Plt wnl - change to Lovenox, no further surgery/procedures planned  Goal of Therapy:  Heparin level 0.3-0.5 units/mL Monitor platelets by anticoagulation protocol: Yes     Plan: changed 12n to Lovenox  Heparin drip resumed 2/13 at 2048 on 1600 units/hr  Increase Heparin infusion to 1700 units/hr  Recheck Hep level at 1700 today  Daily HL and CBC while on heparin  Minda Ditto PharmD Pager [phone] 11/05/2018, 10:01 AM  Addendum: Discontinue Heparin infusion, begin Lovenox 60mg  SQ bid one hour after Heparin discontinued  Minda Ditto PharmD Pager [phone] 11/05/2018, 11:54 AM

## 2018-11-06 LAB — GLUCOSE, CAPILLARY
GLUCOSE-CAPILLARY: 204 mg/dL — AB (ref 70–99)
Glucose-Capillary: 121 mg/dL — ABNORMAL HIGH (ref 70–99)
Glucose-Capillary: 128 mg/dL — ABNORMAL HIGH (ref 70–99)
Glucose-Capillary: 135 mg/dL — ABNORMAL HIGH (ref 70–99)
Glucose-Capillary: 155 mg/dL — ABNORMAL HIGH (ref 70–99)
Glucose-Capillary: 254 mg/dL — ABNORMAL HIGH (ref 70–99)

## 2018-11-06 LAB — DIFFERENTIAL
Abs Immature Granulocytes: 0.1 10*3/uL — ABNORMAL HIGH (ref 0.00–0.07)
Basophils Absolute: 0 10*3/uL (ref 0.0–0.1)
Basophils Relative: 0 %
Eosinophils Absolute: 0 10*3/uL (ref 0.0–0.5)
Eosinophils Relative: 0 %
Immature Granulocytes: 1 %
Lymphocytes Relative: 8 %
Lymphs Abs: 0.8 10*3/uL (ref 0.7–4.0)
MONO ABS: 0.6 10*3/uL (ref 0.1–1.0)
Monocytes Relative: 6 %
Neutro Abs: 8 10*3/uL — ABNORMAL HIGH (ref 1.7–7.7)
Neutrophils Relative %: 85 %

## 2018-11-06 LAB — COMPREHENSIVE METABOLIC PANEL
ALT: 15 U/L (ref 0–44)
AST: 15 U/L (ref 15–41)
Albumin: 1.6 g/dL — ABNORMAL LOW (ref 3.5–5.0)
Alkaline Phosphatase: 59 U/L (ref 38–126)
Anion gap: 8 (ref 5–15)
BUN: 10 mg/dL (ref 6–20)
CO2: 27 mmol/L (ref 22–32)
Calcium: 8.2 mg/dL — ABNORMAL LOW (ref 8.9–10.3)
Chloride: 100 mmol/L (ref 98–111)
Creatinine, Ser: 0.4 mg/dL — ABNORMAL LOW (ref 0.61–1.24)
GFR calc Af Amer: >60 mL/min (ref >60)
GFR calc non Af Amer: >60 mL/min (ref >60)
Glucose, Bld: 191 mg/dL — ABNORMAL HIGH (ref 70–99)
POTASSIUM: 4.2 mmol/L (ref 3.5–5.1)
Sodium: 135 mmol/L (ref 135–145)
Total Bilirubin: 0.4 mg/dL (ref 0.3–1.2)
Total Protein: 6.1 g/dL — ABNORMAL LOW (ref 6.5–8.1)

## 2018-11-06 LAB — CBC
HCT: 33.7 % — ABNORMAL LOW (ref 39.0–52.0)
Hemoglobin: 10.6 g/dL — ABNORMAL LOW (ref 13.0–17.0)
MCH: 28.4 pg (ref 26.0–34.0)
MCHC: 31.5 g/dL (ref 30.0–36.0)
MCV: 90.3 fL (ref 80.0–100.0)
PLATELETS: 318 10*3/uL (ref 150–400)
RBC: 3.73 MIL/uL — ABNORMAL LOW (ref 4.22–5.81)
RDW: 15 % (ref 11.5–15.5)
WBC: 9.4 10*3/uL (ref 4.0–10.5)
nRBC: 0 % (ref 0.0–0.2)

## 2018-11-06 LAB — TRIGLYCERIDES: Triglycerides: 67 mg/dL (ref <150)

## 2018-11-06 LAB — MAGNESIUM: Magnesium: 1.7 mg/dL (ref 1.7–2.4)

## 2018-11-06 LAB — PREALBUMIN: PREALBUMIN: 5.6 mg/dL — AB (ref 18–38)

## 2018-11-06 LAB — PHOSPHORUS: Phosphorus: 3.7 mg/dL (ref 2.5–4.6)

## 2018-11-06 MED ORDER — DEXTROSE-NACL 5-0.45 % IV SOLN
INTRAVENOUS | Status: DC
  Administered 2018-11-06: 17:00:00 via INTRAVENOUS

## 2018-11-06 MED ORDER — INSULIN ASPART 100 UNIT/ML ~~LOC~~ SOLN
0.0000 [IU] | SUBCUTANEOUS | Status: DC
  Administered 2018-11-06: 2 [IU] via SUBCUTANEOUS
  Administered 2018-11-06: 8 [IU] via SUBCUTANEOUS
  Administered 2018-11-07: 3 [IU] via SUBCUTANEOUS
  Administered 2018-11-07: 5 [IU] via SUBCUTANEOUS
  Administered 2018-11-07: 3 [IU] via SUBCUTANEOUS

## 2018-11-06 MED ORDER — TRAVASOL 10 % IV SOLN
INTRAVENOUS | Status: AC
  Administered 2018-11-06: 19:00:00 via INTRAVENOUS
  Filled 2018-11-06: qty 720

## 2018-11-06 MED ORDER — PANTOPRAZOLE SODIUM 40 MG IV SOLR
40.0000 mg | Freq: Two times a day (BID) | INTRAVENOUS | Status: DC
  Administered 2018-11-06 – 2018-11-14 (×18): 40 mg via INTRAVENOUS
  Filled 2018-11-06 (×19): qty 40

## 2018-11-06 NOTE — Progress Notes (Signed)
PROGRESS NOTE    Ryan Medina  [name] DOB: [birth year] DOA: 09/03/2018 PCP: Patient, No Pcp Per  Brief Narrative: 58 year old with history of peripheral vascular disease, dilated cardiomyopathy, essential hypertension, diabetes mellitus type 2 initially came to the hospital with right lower extremity pain and was found to have ischemic leg requiring embolectomy on 12/13. He was placed on heparin drip subsequently developed right lower extremity hematoma requiring fasciotomy on 12/15. Unfortunately afterwards he decompensated with active GI bleed and was intubated and was moved to the ICU. He required embolization on 12/17. During the course of his hospitalization he is required total of 26 units of blood. Further evaluation showed pancreatic cancer with duodenal ulcer. Transferred to [facility] for radiation treatment to help with bleeding. Hospital course complicated by sepsis from healthcare acquired pneumonia and Pseudomonas UTI requiring treatment with vancomycin and cefepime.Patient found to have chronic PE therefore started on heparin drip per oncology. Repeat endoscopy showed worsening of gastric outlet obstruction. Initially NG tube placed by patient removed it. Right now on clear liquid diet.  Assessment & Plan:   Principal Problem:   Popliteal artery embolus (HCC) Active Problems:   DM (diabetes mellitus), type 2, uncontrolled ([place])   Essential hypertension   Dilated cardiomyopathy (HCC)   Chest pain   AAA (abdominal aortic aneurysm) (HCC)   Ischemic foot   Right leg claudication (HCC)   PVD (peripheral vascular disease) ([place])   Hemorrhagic shock ([place])   Nontraumatic ischemic infarction of muscle of right lower leg   Upper GI bleed   Cocaine abuse ([place])   Acute blood loss anemia   Chronic pain syndrome   Duodenal ulcer with hemorrhage   Pancreatic cancer ([place])   Goals of care, counseling/discussion   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 Status post radiation. Per oncology plans for outpatient radiation and chemotherapy -Status post endoscopy 12/17, GDA embolization 12/17, status post radiation. -Continue PPI. -Endoscopy showed worsening of gastric outlet obstruction/pyloric stenosis secondary to malignancy. Candida esophagitis -S/Plaparoscopy open gastrojejunostomy no pancreatic mass amenable to biopsy -PICC WAS NOT PLACED AS HE HAD A PORT .  Continue TPN.  Candida esophagitis -Currently on Diflucan for total of 2 weeks. Day5today.  Chronic bilateral pulmonary embolism started Lovenox - Anemia of chronic disease Status post multiple blood transfusions  Hypokalemia/hypomagnesemia -Repletion ordered. Sepsis secondary to H CAP and Pseudomonas urinary tract infection -Status post treatment with vancomycin and cefepime. PICC line pulled out 1/26. Ischemic right foot with right popliteal and tibial artery embolus -Status post embolectomy. Complicated by compartment syndrome requiring fasciotomy. -Low-dose heparin started. Closely monitor for any bleeding. -Lower extremity routine dressing, follow-up outpatient with vascular  Cardiomyopathy, combined systolic and diastolic CHF, chronic -Patient appears to be euvolemic in nature. Continue to provide supportive care.  Uncontrolled diabetes mellitus type 2 -Hemoglobin A1c  18.2. Continue Lantus, Accu-Chek and sliding scale. Blood glucose in acceptable range  Severe protein calorie malnutrition -Protein supplement ordered  Generalized weakness-physical  therapy/Occupational Therapy-recommending skilled nursing facility Nutrition Problem: Severe Malnutrition Etiology: chronic illness(Pancreatic cancer, DM and CHF)  DVT prophylaxis:Lovenox Code Status:Okay for intubation partial code Family Communication:No family available Disposition Plan: Unknown at this time   Consultants:Gastroenterology  Oncology  Interventional radiology  Critical care  Vascular surgery  Palliative care  General surgery Cardiology  Procedures:Endoscopy 12/17 EUS 12/26 Chemo-Port placement 1/21/2020repeat endoscopy 2/7  antimicrobials Finished course of vancomycin and cefepime  Pressure Injury 11/03/18 Stage II -  Partial thickness loss of dermis presenting as a shallow open ulcer with a red, pink wound bed without slough. granulation, no drainage noted (Active)  11/03/18 0820  Location: Coccyx  Location Orientation: Mid;Upper  Staging: Stage II -  Partial thickness loss of dermis presenting as a shallow open ulcer with a red, pink wound bed without slough.  Wound Description (Comments): granulation, no drainage noted  Present on Admission: No      Nutrition Problem: Severe Malnutrition Etiology: chronic illness(Pancreatic cancer, DM and CHF)     Signs/Symptoms: severe muscle depletion, severe fat depletion, percent weight loss Percent weight loss: 23 %    Interventions: TPN  Estimated body mass index is 17.29 kg/m as calculated from the following:   Height as of this encounter: 5\' 11"  (1.803 m).   Weight as of this encounter: 56.2 kg.   Subjective: Complains of being hungry wants to eat but has not had a bowel movement or having flatus.  Objective: Vitals:   11/05/18 0956 11/05/18 1455 11/05/18 2024 11/06/18 0525  BP: (!) 121/96  (!) 127/100 (!) 127/101 116/84  Pulse: 85 84 78 78  Resp: 16 18 16 16   Temp: 98.3 F (36.8 C) 98.3 F (36.8 C) 97.7 F (36.5 C) (!) 97.4 F (36.3 C)  TempSrc: Oral Oral Oral Oral  SpO2: 98% 100% 100% 100%  Weight:      Height:        Intake/Output Summary (Last 24 hours) at 11/06/2018 1043 Last data filed at 11/06/2018 0730 Gross per 24 hour  Intake 0 ml  Output 1375 ml  Net -1375 ml   Filed Weights   10/29/18 1219 11/02/18 1806 11/04/18 0831  Weight: 54.7 kg 56.2 kg 56.2 kg    Examination:  General exam: Appears calm and comfortable  Respiratory system: Clear to auscultation. Respiratory effort normal. Cardiovascular system: S1 & S2 heard, RRR. No JVD, murmurs, rubs, gallops or clicks. No pedal edema. Gastrointestinal system: Abdomen is nondistended, soft and appropriately tender. No organomegaly or masses felt.  Diminished to no bowel sounds Central nervous system: Alert and oriented. No focal neurological deficits. Extremities: Symmetric 5 x 5 power. Skin: No rashes, lesions or ulcers Psychiatry: Judgement and insight appear normal. Mood & affect appropriate.     Data Reviewed: I have personally reviewed following labs and imaging studies  CBC: Recent Labs  Lab 10/31/18 0503 11/01/18 0530 11/02/18 0451 11/03/18 0444 11/04/18 0344 11/06/18 0641  WBC 8.1 7.2 7.9 9.3 9.7 9.4  NEUTROABS 6.4 5.7 6.3  --   --  8.0*  HGB 8.1* 7.9* 7.5* 10.5* 10.6* 10.6*  HCT 25.9* 25.8* 24.6* 33.5* 32.7* 33.7*  MCV 89.9 90.2 89.1 89.5 87.9 90.3  PLT 337 365 320 332 329 628   Basic Metabolic Panel: Recent Labs  Lab 11/02/18 0451 11/03/18 0444 11/04/18 0344 11/05/18 0814 11/06/18 0641  NA 133* 135 135 132* 135  K 3.0* 3.5 4.1 3.7 4.2  CL 98 99 99 100 100  CO2 26 28 26  21* 27  GLUCOSE 205* 137* 104* 195* 191*  BUN <5* 5* 6 14 10   CREATININE 0.36* 0.45* 0.42* 0.51* 0.40*  CALCIUM 7.4* 7.9* 7.9* 8.2* 8.2*  MG 1.5* 1.7 1.7 1.5* 1.7  PHOS  --   --   --   --  3.7    GFR: Estimated Creatinine Clearance: 81 mL/min (A) (by C-G formula based on SCr of 0.4 mg/dL (L)). Liver Function Tests: Recent Labs  Lab 11/01/18 0530 11/02/18 0451 11/03/18 0444 11/04/18 0344 11/06/18 0641  AST 12* 16 17 15 15   ALT 13 15 17 17 15   ALKPHOS 63 64 64 69 59  BILITOT 0.4 0.4 0.6 0.3 0.4  PROT 6.0* 6.0* 6.2* 6.6 6.1*  ALBUMIN 1.4* 1.4* 1.6* 1.5* 1.6*   No results for input(s): LIPASE, AMYLASE in the last 168 hours. No results for input(s): AMMONIA in the last 168 hours. Coagulation Profile: Recent Labs  Lab 11/02/18 0451  INR 1.31   Cardiac Enzymes: No results for input(s): CKTOTAL, CKMB, CKMBINDEX, TROPONINI in the last 168 hours. BNP (last 3 results) No results for input(s): PROBNP in the last 8760 hours. HbA1C: No results for input(s): HGBA1C in the last 72 hours. CBG: Recent Labs  Lab 11/05/18 1716 11/05/18 2019 11/06/18 0041 11/06/18 0434 11/06/18 0727  GLUCAP 85 90 128* 121* 204*   Lipid Profile: Recent Labs    11/06/18 0641  TRIG 67   Thyroid Function Tests: No results for input(s): TSH, T4TOTAL, FREET4, T3FREE, THYROIDAB in the last 72 hours. Anemia Panel: No results for input(s): VITAMINB12, FOLATE, FERRITIN, TIBC, IRON, RETICCTPCT in the last 72 hours. Sepsis Labs: No results for input(s): PROCALCITON, LATICACIDVEN in the last 168 hours.  No results found for this or any previous visit (from the past 240 hour(s)).       Radiology Studies: [place] Ekg Site Rite  Result Date: 11/05/2018 If [facility] image not attached, placement could not be confirmed due to current cardiac rhythm.       Scheduled Meds: . enoxaparin (LOVENOX) injection  60 mg Subcutaneous BID  . heparin lock flush  500 Units Intracatheter Q30 days  . insulin aspart  0-9 Units Subcutaneous Q4H  . insulin detemir  4 Units Subcutaneous Daily   Continuous Infusions: . dextrose 5 % and 0.45% NaCl 45 mL/hr at 11/05/18 1802  . dextrose 5 % and 0.45% NaCl    .  methocarbamol (ROBAXIN) IV 500 mg (11/06/18 0558)  . TPN ADULT (ION) 40 mL/hr at 11/05/18 1751  . TPN ADULT (ION)       LOS: 64 days      Georgette Shell, MD [facility]  If 7PM-7AM, please contact night-coverage [email or web address] Password [facility] 11/06/2018, 10:43 AM

## 2018-11-06 NOTE — Progress Notes (Signed)
2 Days Post-Op    CC: Nonresectable pancreatic cancer with gastric outlet syndrome  Subjective: Patient has no complaints other than he is hungry  Objective: Vital signs in last 24 hours: Temp:  [97.4 F (36.3 C)-98.3 F (36.8 C)] 97.4 F (36.3 C) (02/15 0525) Pulse Rate:  [78-85] 78 (02/15 0525) Resp:  [16-18] 16 (02/15 0525) BP: (116-127)/(84-101) 116/84 (02/15 0525) SpO2:  [98 %-100 %] 100 % (02/15 0525) Last BM Date: 11/02/18  Intake/Output from previous day: 02/14 0701 - 02/15 0700 In: -  Out: 1125 [Urine:1125] Intake/Output this shift: Total I/O In: 0  Out: 250 [Urine:250]  General appearance: alert, cooperative and no distress Resp: clear to auscultation bilaterally GI: tender, distended, sore, rare BS. No flatus or BM  Lab Results:  Recent Labs    11/04/18 0344 11/06/18 0641  WBC 9.7 9.4  HGB 10.6* 10.6*  HCT 32.7* 33.7*  PLT 329 318    BMET Recent Labs    11/05/18 0814 11/06/18 0641  NA 132* 135  K 3.7 4.2  CL 100 100  CO2 21* 27  GLUCOSE 195* 191*  BUN 14 10  CREATININE 0.51* 0.40*  CALCIUM 8.2* 8.2*   PT/INR No results for input(s): LABPROT, INR in the last 72 hours.  Recent Labs  Lab 11/01/18 0530 11/02/18 0451 11/03/18 0444 11/04/18 0344 11/06/18 0641  AST 12* 16 17 15 15   ALT 13 15 17 17 15   ALKPHOS 63 64 64 69 59  BILITOT 0.4 0.4 0.6 0.3 0.4  PROT 6.0* 6.0* 6.2* 6.6 6.1*  ALBUMIN 1.4* 1.4* 1.6* 1.5* 1.6*     Lipase     Component Value Date/Time   LIPASE 42 08/22/2018 1932     Medications: . enoxaparin (LOVENOX) injection  60 mg Subcutaneous BID  . heparin lock flush  500 Units Intracatheter Q30 days  . insulin aspart  0-9 Units Subcutaneous Q4H  . insulin detemir  4 Units Subcutaneous Daily   . dextrose 5 % and 0.45% NaCl 45 mL/hr at 11/05/18 1802  . methocarbamol (ROBAXIN) IV 500 mg (11/06/18 0558)  . 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 Chronic pain/hx of substance abuse -ETOH/Cocaine/tobacco Anemia H/H: 7.9/25.8 Hx exploratory laparotomy,closure of pyloric channel ulcer with Phillip Heal patch 12/2003 Dr. Harlow Asa Cocaine positive 09/27/18 Hypokalemia/hypomagnesemia Severe malnutrition-prealbumin 5.1   Hemorrhagic shock secondary to duodenalulcer-GI bleed/embolization 09/07/2018 with radiation therapy/chemotherapy pending Nonresectable pancreatic cancer with gastric outlet syndrome/liver metastasis Laparoscopy with open gastrojejunostomy 11/04/2018, Dr. Johnathan Hausen  FEN:IV fluids/TPN   ID: Diflucan 2/7>>2/12 (candida esophagitis) cefotetan preop 11/04/2018 DVT: SCD/heparin drip Follow up: TBD   Plan:  PICC line and TPN until he can eat.  -will try liquid diet today    LOS: 64 days    Rosario Adie 0/09/7492

## 2018-11-06 NOTE — Progress Notes (Signed)
PHARMACY - ADULT TOTAL PARENTERAL NUTRITION CONSULT NOTE   Pharmacy Consult for TPN  Indication: gastric outlet obstruction 2/2 advanced pancreatic adenocarcinoma s/p laparoscopy, open gastrojejunostomy on 2/13  Patient Measurements: Height: 5' 11"  (180.3 cm) Weight: 124 lb (56.2 kg) IBW/kg (Calculated) : 75.3 TPN AdjBW (KG): 56.2 Body mass index is 17.29 kg/m.   Insulin Requirements: 5 units sensitive SSI in past 24 hours + levemir 4 units daily   Current Nutrition: TPN; NPO  IVF: D51/2NS at 45 ml/hr   Central access: port TPN start date: 11/05/2018  ASSESSMENT                                                                                                          HPI: 57 y/oM with RLE pain/numbness, DVT and limb ischemia > urgent arteriography & popliteal embolectomy on 12/13 > placed on heparin drip, developed RLE hematoma requiring fasciotomy 12/15 > hemorrhagic shock due to GI bleed, AC stopped > heparin resumed 2/5 for PE > change to lovenox 2/14 - GOO secondary to pancreatic Ca, s/p laparoscopy, open gastrojejunostomy on 2/13, pt removed NGT in PACU -pharmacy consulted to start TPN 2/14  Significant events:   2/15: try clear liquids per CCS  Today:    Glucose - 90-204 since TPN initiation (goal < 150)  Electrolytes - all including corrected calcium WNL   Renal - SCr low, 0.4  LFTs - Albumin low; AST/ALT, Alk Phos, Tbili WNL   TGs - 67  Prealbumin - 5.1 (2/11), 5.6 (2/15)  NUTRITIONAL GOALS                                                                                             RD recs (2/14):  Kcal:  2000-2200 kcal Protein:  100-110 grams  Custom TPN at goal rate of 32m/hr provides: - 102 g/day protein (50 g/L) - 61.2 g/day Lipid  (30 g/L) - 306 g/day Dextrose (15 %) -  2060 Kcal/day  PLAN                                                                                                                         At 1800 today:  Increase TPN rate to  60 ml/hr. -  [id number] kCals, 72g protein per day,meeting~ 70 % of goal kcal and goal AA  Electrolytes in TPN: Continue increased Na+ and K+ at 60 mEq/L, increase Mag to 7.5 mEq/L, continue standard Ca and Phos, Cl:Ac ratio to 1:1  Plan to advance as tolerated to the goal rate.  TPN to contain standard multivitamins and trace elements.  Reduce IVF to 25 ml/hr.  Change SSI from sensitive to moderate scale q4h. Continue Levemir per MD.   BMET, Mag, Phos in AM.   TPN lab panels on Mondays & Thursdays.  F/u daily.   Lindell Spar, PharmD, BCPS Pager: [phone] 11/06/2018 9:13 AM

## 2018-11-07 ENCOUNTER — Inpatient Hospital Stay (HOSPITAL_COMMUNITY): Payer: Medicaid Other

## 2018-11-07 LAB — GLUCOSE, CAPILLARY
Glucose-Capillary: 169 mg/dL — ABNORMAL HIGH (ref 70–99)
Glucose-Capillary: 170 mg/dL — ABNORMAL HIGH (ref 70–99)
Glucose-Capillary: 182 mg/dL — ABNORMAL HIGH (ref 70–99)
Glucose-Capillary: 229 mg/dL — ABNORMAL HIGH (ref 70–99)
Glucose-Capillary: 277 mg/dL — ABNORMAL HIGH (ref 70–99)

## 2018-11-07 LAB — BASIC METABOLIC PANEL
Anion gap: 10 (ref 5–15)
BUN: 9 mg/dL (ref 6–20)
CALCIUM: 8.1 mg/dL — AB (ref 8.9–10.3)
CO2: 28 mmol/L (ref 22–32)
Chloride: 99 mmol/L (ref 98–111)
Creatinine, Ser: 0.41 mg/dL — ABNORMAL LOW (ref 0.61–1.24)
GFR calc Af Amer: >60 mL/min (ref >60)
Glucose, Bld: 144 mg/dL — ABNORMAL HIGH (ref 70–99)
Potassium: 3.3 mmol/L — ABNORMAL LOW (ref 3.5–5.1)
Sodium: 137 mmol/L (ref 135–145)

## 2018-11-07 LAB — PHOSPHORUS: Phosphorus: 3.1 mg/dL (ref 2.5–4.6)

## 2018-11-07 LAB — MAGNESIUM: Magnesium: 1.8 mg/dL (ref 1.7–2.4)

## 2018-11-07 MED ORDER — POTASSIUM CHLORIDE 10 MEQ/100ML IV SOLN
10.0000 meq | INTRAVENOUS | Status: AC
  Administered 2018-11-07 – 2018-11-08 (×4): 10 meq via INTRAVENOUS
  Filled 2018-11-07 (×2): qty 100

## 2018-11-07 MED ORDER — INSULIN ASPART 100 UNIT/ML ~~LOC~~ SOLN
0.0000 [IU] | SUBCUTANEOUS | Status: DC
  Administered 2018-11-07: 4 [IU] via SUBCUTANEOUS
  Administered 2018-11-07: 11 [IU] via SUBCUTANEOUS
  Administered 2018-11-08: 4 [IU] via SUBCUTANEOUS
  Administered 2018-11-08 (×2): 3 [IU] via SUBCUTANEOUS
  Administered 2018-11-08 (×2): 4 [IU] via SUBCUTANEOUS
  Administered 2018-11-08 – 2018-11-09 (×2): 3 [IU] via SUBCUTANEOUS
  Administered 2018-11-09: 4 [IU] via SUBCUTANEOUS
  Administered 2018-11-09: 0 [IU] via SUBCUTANEOUS
  Administered 2018-11-10 (×2): 3 [IU] via SUBCUTANEOUS
  Administered 2018-11-10: 4 [IU] via SUBCUTANEOUS
  Administered 2018-11-10 (×2): 7 [IU] via SUBCUTANEOUS
  Administered 2018-11-11 (×5): 3 [IU] via SUBCUTANEOUS
  Administered 2018-11-12 (×2): 7 [IU] via SUBCUTANEOUS
  Administered 2018-11-12: 4 [IU] via SUBCUTANEOUS
  Administered 2018-11-12 – 2018-11-13 (×2): 3 [IU] via SUBCUTANEOUS
  Administered 2018-11-13: 7 [IU] via SUBCUTANEOUS

## 2018-11-07 MED ORDER — BISACODYL 10 MG RE SUPP
10.0000 mg | Freq: Once | RECTAL | Status: AC
  Administered 2018-11-07: 10 mg via RECTAL
  Filled 2018-11-07: qty 1

## 2018-11-07 MED ORDER — SODIUM CHLORIDE 0.9% FLUSH
10.0000 mL | INTRAVENOUS | Status: DC | PRN
  Administered 2018-11-12: 10 mL
  Filled 2018-11-07: qty 40

## 2018-11-07 MED ORDER — TRAVASOL 10 % IV SOLN: INTRAVENOUS | Status: DC

## 2018-11-07 MED ORDER — TRAVASOL 10 % IV SOLN
INTRAVENOUS | Status: AC
  Administered 2018-11-07: 18:00:00 via INTRAVENOUS
  Filled 2018-11-07: qty 720

## 2018-11-07 MED ORDER — POTASSIUM CHLORIDE 10 MEQ/50ML IV SOLN
10.0000 meq | INTRAVENOUS | Status: DC
  Filled 2018-11-07 (×4): qty 50

## 2018-11-07 NOTE — Progress Notes (Signed)
3 Days Post-Op    CC: Nonresectable pancreatic cancer with gastric outlet syndrome  Subjective: Patient feeling better.  Tolerating clears  Objective: Vital signs in last 24 hours: Temp:  [97.4 F (36.3 C)-97.9 F (36.6 C)] 97.4 F (36.3 C) (02/16 0517) Pulse Rate:  [70-79] 75 (02/16 0517) Resp:  [16-20] 16 (02/16 0517) BP: (122-131)/(92-97) 131/94 (02/16 0517) SpO2:  [100 %] 100 % (02/16 0517) Last BM Date: 11/02/18  Intake/Output from previous day: 02/15 0701 - 02/16 0700 In: 360 [P.O.:360] Out: 2300 [Urine:2300] Intake/Output this shift: No intake/output data recorded.  General appearance: alert, cooperative and no distress Resp: clear to auscultation bilaterally GI: tender, distended, sore, rare BS. No flatus or BM  Lab Results:  Recent Labs    11/06/18 0641  WBC 9.4  HGB 10.6*  HCT 33.7*  PLT 318    BMET Recent Labs    11/05/18 0814 11/06/18 0641  NA 132* 135  K 3.7 4.2  CL 100 100  CO2 21* 27  GLUCOSE 195* 191*  BUN 14 10  CREATININE 0.51* 0.40*  CALCIUM 8.2* 8.2*   PT/INR No results for input(s): LABPROT, INR in the last 72 hours.  Recent Labs  Lab 11/01/18 0530 11/02/18 0451 11/03/18 0444 11/04/18 0344 11/06/18 0641  AST 12* 16 17 15 15   ALT 13 15 17 17 15   ALKPHOS 63 64 64 69 59  BILITOT 0.4 0.4 0.6 0.3 0.4  PROT 6.0* 6.0* 6.2* 6.6 6.1*  ALBUMIN 1.4* 1.4* 1.6* 1.5* 1.6*     Lipase     Component Value Date/Time   LIPASE 42 08/22/2018 1932     Medications: . enoxaparin (LOVENOX) injection  60 mg Subcutaneous BID  . heparin lock flush  500 Units Intracatheter Q30 days  . insulin aspart  0-15 Units Subcutaneous Q4H  . insulin detemir  4 Units Subcutaneous Daily  . pantoprazole (PROTONIX) IV  40 mg Intravenous Q12H   . dextrose 5 % and 0.45% NaCl 25 mL/hr at 11/06/18 1708  . methocarbamol (ROBAXIN) IV 500 mg (11/07/18 0624)  . 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 Chronic pain/hx of substance abuse -ETOH/Cocaine/tobacco Anemia H/H: 7.9/25.8 Hx exploratory laparotomy,closure of pyloric channel ulcer with Phillip Heal patch 12/2003 Dr. Harlow Asa Cocaine positive 09/27/18 Hypokalemia/hypomagnesemia Severe malnutrition-prealbumin 5.1   Hemorrhagic shock secondary to duodenalulcer-GI bleed/embolization 09/07/2018 with radiation therapy/chemotherapy pending Nonresectable pancreatic cancer with gastric outlet syndrome/liver metastasis Laparoscopy with open gastrojejunostomy 11/04/2018, Dr. Johnathan Hausen  FEN:IV fluids/TPN   ID: Diflucan 2/7>>2/12 (candida esophagitis) cefotetan preop 11/04/2018 DVT: SCD/heparin drip Follow up: TBD   Plan:  PICC line and TPN until he can eat.  -advance to FLD today    LOS: 65 days    Ryan Medina 1/69/4503

## 2018-11-07 NOTE — Progress Notes (Signed)
PROGRESS NOTE    Ezel Vallone Hynek  DTO:[id number] DOB: 12-24-1960 DOA: 09/03/2018 PCP: Patient, No Pcp Per  Brief Narrative: 58 year old with history of peripheral vascular disease, dilated cardiomyopathy, essential hypertension, diabetes mellitus type 2 initially came to the hospital with right lower extremity pain and was found to have ischemic leg requiring embolectomy on 12/13. He was placed on heparin drip subsequently developed right lower extremity hematoma requiring fasciotomy on 12/15. Unfortunately afterwards he decompensated with active GI bleed and was intubated and was moved to the ICU. He required embolization on 12/17. During the course of his hospitalization he is required total of 26 units of blood. Further evaluation showed pancreatic cancer with duodenal ulcer. Transferred to [facility] [facility] for radiation treatment to help with bleeding. Hospital course complicated by sepsis from healthcare acquired pneumonia and Pseudomonas UTI requiring treatment with vancomycin and cefepime.Patient found to have chronic PE therefore started on heparin drip per oncology. Repeat endoscopy showed worsening of gastric outlet obstruction. Initially NG tube placed by patient removed it. Right now on clear liquid diet  Assessment & Plan:   Principal Problem:   Popliteal artery embolus (HCC) Active Problems:   DM (diabetes mellitus), type 2, uncontrolled (HCC)   Essential hypertension   Dilated cardiomyopathy (HCC)   Chest pain   AAA (abdominal aortic aneurysm) (HCC)   Ischemic foot   Right leg claudication (HCC)   PVD (peripheral vascular disease) ([place])   Hemorrhagic shock ([place])   Nontraumatic ischemic infarction of muscle of right lower leg   Upper GI bleed   Cocaine abuse ([place])   Acute blood loss anemia   Chronic pain syndrome   Duodenal ulcer with hemorrhage   Pancreatic cancer ([place])   Goals of care, counseling/discussion   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)   Palliative care by specialist   Protein-calorie malnutrition, severe   Abdominal distention   Cancer associated pain   HCAP (healthcare-associated pneumonia)   Acute lower UTI   Sepsis ([place])   Homelessness   Weakness generalized   Gastric outflow obstruction   Adenocarcinoma of pancreas Hemorrhagic shock due to GI bleed from duodenal ulcer, stable Gastric outlet obstruction causing dysphagia and abdominal distention -CT scan shows increase in size of the mass with involvement of intra-abdominal arteries, deemed to be inoperable. Status post radiation. Per oncology plans for outpatient radiation and chemotherapy -Status post endoscopy 12/17, GDA embolization 12/17, status post radiation. -Continue PPI. -Endoscopy showed worsening of gastric outlet obstruction/pyloric stenosis secondary to malignancy. Candida esophagitis -S/Plaparoscopy open gastrojejunostomy no pancreatic mass amenable to biopsy -PICC WAS NOT PLACED AS HE HAD A PORT .  Continue TPN.  Candida esophagitis -Currently on Diflucan for total of 2 weeks. [id number].  Chronic bilateral pulmonary embolism started Lovenox  Anemia of chronic disease Status post multiple blood transfusions 24 blood transfusions   Hypokalemia/hypomagnesemia -Repletion ordered.  Sepsis secondary to H CAP and Pseudomonas urinary tract infection -Status post treatment with vancomycin and cefepime. PICC line pulled out 1/26.  Ischemic right foot with right popliteal and tibial artery embolus -Status post embolectomy. Complicated by compartment syndrome requiring fasciotomy.  Cardiomyopathy, combined systolic and diastolic CHF, chronic -Patient appears to be euvolemic in nature. Continue to provide supportive care.  Uncontrolled diabetes mellitus type 2 -Hemoglobin A1c 18.2. Continue Lantus, Accu-Chek and sliding scale. Blood glucose in acceptable range  Severe protein calorie  malnutrition -Protein supplement ordered  Generalized weakness-physical therapy/Occupational Therapy-recommending skilled nursing facility Nutrition Problem: Severe Malnutrition Etiology: chronic  illness(Pancreatic cancer, DM and CHF)   DVT prophylaxis:Lovenox Code Status:Okay for intubation partial code Family Communication:No family available Disposition Plan: Unknown at this time   Consultants:Gastroenterology  Oncology  Interventional radiology  Critical care  Vascular surgery  Palliative care  General surgery Cardiology  Procedures:Endoscopy 12/17 EUS 12/26 Chemo-Port placement 1/21/2020repeat endoscopy 2/7  antimicrobials Finished course of vancomycin and cefepime  Pressure Injury 11/03/18 Stage II -  Partial thickness loss of dermis presenting as a shallow open ulcer with a red, pink wound bed without slough. granulation, no drainage noted (Active)  11/03/18 0820  Location: Coccyx  Location Orientation: Mid;Upper  Staging: Stage II -  Partial thickness loss of dermis presenting as a shallow open ulcer with a red, pink wound bed without slough.  Wound Description (Comments): granulation, no drainage noted  Present on Admission: No      Nutrition Problem: Severe Malnutrition Etiology: chronic illness(Pancreatic cancer, DM and CHF)     Signs/Symptoms: severe muscle depletion, severe fat depletion, percent weight loss Percent weight loss: 23 %    Interventions: TPN  Estimated body mass index is 17.29 kg/m as calculated from the following:   Height as of this encounter: 5\' 11"  (1.803 m).   Weight as of this encounter: 56.2 kg.   Subjective: Feels better had 3 bowel movements wanting to eat  Objective: Vitals:   11/06/18 0525 11/06/18 1242 11/06/18 2023 11/07/18 0517  BP: 116/84 (!) 125/97 (!) 122/92 (!) 131/94  Pulse: 78 79 70 75  Resp: 16 20 16 16   Temp: (!) 97.4 F (36.3 C) 97.9 F (36.6 C) (!) 97.5 F (36.4 C) (!) 97.4 F  (36.3 C)  TempSrc: Oral Oral Oral Oral  SpO2: 100% 100% 100% 100%  Weight:      Height:        Intake/Output Summary (Last 24 hours) at 11/07/2018 0836 Last data filed at 11/07/2018 0440 Gross per 24 hour  Intake 240 ml  Output 1800 ml  Net -1560 ml   Filed Weights   10/29/18 1219 11/02/18 1806 11/04/18 0831  Weight: 54.7 kg 56.2 kg 56.2 kg    Examination:  General exam: Appears calm and comfortable  Respiratory system: Clear to auscultation. Respiratory effort normal. Cardiovascular system: S1 & S2 heard, RRR. No JVD, murmurs, rubs, gallops or clicks. No pedal edema. Gastrointestinal system: Abdomen is nondistended, soft and tender along incision no organomegaly or masses felt. Normal bowel sounds heard. Central nervous system: Alert and oriented. No focal neurological deficits. Extremities: Right foot covered in dressing Skin: No rashes, lesions or ulcers Psychiatry: Judgement and insight appear normal. Mood & affect appropriate.     Data Reviewed: I have personally reviewed following labs and imaging studies  CBC: Recent Labs  Lab 11/01/18 0530 11/02/18 0451 11/03/18 0444 11/04/18 0344 11/06/18 0641  WBC 7.2 7.9 9.3 9.7 9.4  NEUTROABS 5.7 6.3  --   --  8.0*  HGB 7.9* 7.5* 10.5* 10.6* 10.6*  HCT 25.8* 24.6* 33.5* 32.7* 33.7*  MCV 90.2 89.1 89.5 87.9 90.3  PLT 365 320 332 329 166   Basic Metabolic Panel: Recent Labs  Lab 11/02/18 0451 11/03/18 0444 11/04/18 0344 11/05/18 0814 11/06/18 0641  NA 133* 135 135 132* 135  K 3.0* 3.5 4.1 3.7 4.2  CL 98 99 99 100 100  CO2 26 28 26  21* 27  GLUCOSE 205* 137* 104* 195* 191*  BUN <5* 5* 6 14 10   CREATININE 0.36* 0.45* 0.42* 0.51* 0.40*  CALCIUM 7.4* 7.9* 7.9*  8.2* 8.2*  MG 1.5* 1.7 1.7 1.5* 1.7  PHOS  --   --   --   --  3.7   GFR: Estimated Creatinine Clearance: 81 mL/min (A) (by C-G formula based on SCr of 0.4 mg/dL (L)). Liver Function Tests: Recent Labs  Lab 11/01/18 0530 11/02/18 0451 11/03/18 0444  11/04/18 0344 11/06/18 0641  AST 12* 16 17 15 15   ALT 13 15 17 17 15   ALKPHOS 63 64 64 69 59  BILITOT 0.4 0.4 0.6 0.3 0.4  PROT 6.0* 6.0* 6.2* 6.6 6.1*  ALBUMIN 1.4* 1.4* 1.6* 1.5* 1.6*   No results for input(s): LIPASE, AMYLASE in the last 168 hours. No results for input(s): AMMONIA in the last 168 hours. Coagulation Profile: Recent Labs  Lab 11/02/18 0451  INR 1.31   Cardiac Enzymes: No results for input(s): CKTOTAL, CKMB, CKMBINDEX, TROPONINI in the last 168 hours. BNP (last 3 results) No results for input(s): PROBNP in the last 8760 hours. HbA1C: No results for input(s): HGBA1C in the last 72 hours. CBG: Recent Labs  Lab 11/06/18 1608 11/06/18 2027 11/07/18 0003 11/07/18 0434 11/07/18 0741  GLUCAP 135* 155* 182* 170* 229*   Lipid Profile: Recent Labs    11/06/18 0641  TRIG 67   Thyroid Function Tests: No results for input(s): TSH, T4TOTAL, FREET4, T3FREE, THYROIDAB in the last 72 hours. Anemia Panel: No results for input(s): VITAMINB12, FOLATE, FERRITIN, TIBC, IRON, RETICCTPCT in the last 72 hours. Sepsis Labs: No results for input(s): PROCALCITON, LATICACIDVEN in the last 168 hours.  No results found for this or any previous visit (from the past 240 hour(s)).       Radiology Studies: [place] Ekg Site Rite  Result Date: 11/05/2018 If [facility] image not attached, placement could not be confirmed due to current cardiac rhythm.       Scheduled Meds: . enoxaparin (LOVENOX) injection  60 mg Subcutaneous BID  . heparin lock flush  500 Units Intracatheter Q30 days  . insulin aspart  0-15 Units Subcutaneous Q4H  . insulin detemir  4 Units Subcutaneous Daily  . pantoprazole (PROTONIX) IV  40 mg Intravenous Q12H   Continuous Infusions: . dextrose 5 % and 0.45% NaCl 25 mL/hr at 11/06/18 1708  . methocarbamol (ROBAXIN) IV 500 mg (11/07/18 0624)  . TPN ADULT (ION) 60 mL/hr at 11/06/18 1840     LOS: 59 days     Georgette Shell, MD [facility]  If 7PM-7AM, please contact night-coverage [email or web address] Password [facility] 11/07/2018, 8:36 AM

## 2018-11-07 NOTE — Progress Notes (Signed)
PHARMACY - ADULT TOTAL PARENTERAL NUTRITION CONSULT NOTE   Pharmacy Consult for TPN  Indication: gastric outlet obstruction 2/2 advanced pancreatic adenocarcinoma s/p laparoscopy, open gastrojejunostomy on 2/13  Patient Measurements: Height: 5' 11"  (180.3 cm) Weight: 124 lb (56.2 kg) IBW/kg (Calculated) : 75.3 TPN AdjBW (KG): 56.2 Body mass index is 17.29 kg/m.   Insulin Requirements: 19 units SSI in past 24 hours + levemir 4 units daily   Current Nutrition: TPN; clear liquid diet --> full liquid diet   IVF: D51/2NS at 25 ml/hr   Central access: port TPN start date: 11/05/2018  ASSESSMENT                                                                                                          HPI: 26 y/oM with RLE pain/numbness, DVT and limb ischemia > urgent arteriography & popliteal embolectomy on 12/13 > placed on heparin drip, developed RLE hematoma requiring fasciotomy 12/15 > hemorrhagic shock due to GI bleed, AC stopped > heparin resumed 2/5 for PE > change to lovenox 2/14 - GOO secondary to pancreatic Ca, s/p laparoscopy, open gastrojejunostomy on 2/13, pt removed NGT in PACU -pharmacy consulted to start TPN 2/14  Significant events:   2/15: try clear liquids per CCS  2/16: advance to full liquid diet per MD  Today:    Glucose - CBGs 135-254 in past 24 hours (goal < 150)  Electrolytes - K+ low at 3.3, all others including corrected calcium WNL   Renal - SCr low, 0.41  LFTs - Albumin low; AST/ALT, Alk Phos, Tbili WNL (2/15)  TGs - 67 (2/15)  Prealbumin - 5.1 (2/11), 5.6 (2/15)  NUTRITIONAL GOALS                                                                                             RD recs (2/14):  Kcal:  2000-2200 kcal Protein:  100-110 grams  Custom TPN at goal rate of 33m/hr provides: - 102 g/day protein (50 g/L) - 61.2 g/day Lipid  (30 g/L) - 306 g/day Dextrose (15 %) -  2060 Kcal/day  PLAN      KCl 169m IV x 4 runs now  [facility] fluids  [facility]  At 1800 today:  Continue TPN at 60 ml/hr. -Provides1454 kCals, 72g proteinper day,meeting~ 70% of goal kcal and goal AA -not advancing TPN rate today as CBGs elevated and advancing diet   Electrolytes in TPN: Continue increased Na+ at 60 mEq/L and increased Mag at 7.5 mEq/L, increase K+ to 70 mEq/L, continue standard Ca and Phos, Cl:Ac ratio 1:1  Plan to advance as tolerated to the goal rate.  TPN to contain standard multivitamins and trace elements.  Change SSI from moderate to resistant scale q4h. Continue Levemir per MD.   TPN lab panels on Mondays & Thursdays.  F/u daily.   Lindell Spar, PharmD, BCPS Pager: [phone] 11/07/2018 11:49 AM

## 2018-11-07 NOTE — Progress Notes (Signed)
Dr Rodman Key paged concerning patients complaints of increased abdominal pain and distension. VS stable, KUB ordered by MD and patient medicated for pain. Dr Kae Heller from [facility] paged and informed of patients status per Dr Zigmund Daniel request. Dr Kae Heller aware and will check Xray and follow up with patient in the AM. Will continue to monitor.

## 2018-11-08 LAB — CBC
HCT: 26.6 % — ABNORMAL LOW (ref 39.0–52.0)
HCT: 25.2 % — ABNORMAL LOW (ref 39.0–52.0)
Hemoglobin: 8.1 g/dL — ABNORMAL LOW (ref 13.0–17.0)
Hemoglobin: 7.8 g/dL — ABNORMAL LOW (ref 13.0–17.0)
MCH: 28.4 pg (ref 26.0–34.0)
MCH: 28.4 pg (ref 26.0–34.0)
MCHC: 30.5 g/dL (ref 30.0–36.0)
MCHC: 31 g/dL (ref 30.0–36.0)
MCV: 93.3 fL (ref 80.0–100.0)
MCV: 91.6 fL (ref 80.0–100.0)
PLATELETS: 347 10*3/uL (ref 150–400)
Platelets: 297 10*3/uL (ref 150–400)
RBC: 2.85 MIL/uL — ABNORMAL LOW (ref 4.22–5.81)
RBC: 2.75 MIL/uL — ABNORMAL LOW (ref 4.22–5.81)
RDW: 14.8 % (ref 11.5–15.5)
RDW: 14.9 % (ref 11.5–15.5)
WBC: 9.5 10*3/uL (ref 4.0–10.5)
WBC: 6.2 10*3/uL (ref 4.0–10.5)
nRBC: 0 % (ref 0.0–0.2)
nRBC: 0 % (ref 0.0–0.2)

## 2018-11-08 LAB — DIFFERENTIAL
Abs Immature Granulocytes: 0.03 10*3/uL (ref 0.00–0.07)
BASOS ABS: 0 10*3/uL (ref 0.0–0.1)
Basophils Relative: 0 %
EOS ABS: 0 10*3/uL (ref 0.0–0.5)
Eosinophils Relative: 0 %
Immature Granulocytes: 1 %
Lymphocytes Relative: 13 %
Lymphs Abs: 0.8 10*3/uL (ref 0.7–4.0)
Monocytes Absolute: 0.3 10*3/uL (ref 0.1–1.0)
Monocytes Relative: 5 %
Neutro Abs: 5 10*3/uL (ref 1.7–7.7)
Neutrophils Relative %: 81 %

## 2018-11-08 LAB — PHOSPHORUS: Phosphorus: 2.9 mg/dL (ref 2.5–4.6)

## 2018-11-08 LAB — GLUCOSE, CAPILLARY
GLUCOSE-CAPILLARY: 90 mg/dL (ref 70–99)
GLUCOSE-CAPILLARY: 156 mg/dL — AB (ref 70–99)
Glucose-Capillary: 172 mg/dL — ABNORMAL HIGH (ref 70–99)
Glucose-Capillary: 131 mg/dL — ABNORMAL HIGH (ref 70–99)
Glucose-Capillary: 157 mg/dL — ABNORMAL HIGH (ref 70–99)
Glucose-Capillary: 148 mg/dL — ABNORMAL HIGH (ref 70–99)
Glucose-Capillary: 126 mg/dL — ABNORMAL HIGH (ref 70–99)
Glucose-Capillary: 70 mg/dL (ref 70–99)

## 2018-11-08 LAB — COMPREHENSIVE METABOLIC PANEL
ALT: 22 U/L (ref 0–44)
ANION GAP: 8 (ref 5–15)
AST: 21 U/L (ref 15–41)
Albumin: 1.6 g/dL — ABNORMAL LOW (ref 3.5–5.0)
Alkaline Phosphatase: 65 U/L (ref 38–126)
BUN: 28 mg/dL — ABNORMAL HIGH (ref 6–20)
CO2: 27 mmol/L (ref 22–32)
Calcium: 8 mg/dL — ABNORMAL LOW (ref 8.9–10.3)
Chloride: 100 mmol/L (ref 98–111)
Creatinine, Ser: 0.49 mg/dL — ABNORMAL LOW (ref 0.61–1.24)
GFR calc Af Amer: >60 mL/min (ref >60)
Glucose, Bld: 159 mg/dL — ABNORMAL HIGH (ref 70–99)
Potassium: 4.8 mmol/L (ref 3.5–5.1)
Sodium: 135 mmol/L (ref 135–145)
Total Bilirubin: 0.3 mg/dL (ref 0.3–1.2)
Total Protein: 6.2 g/dL — ABNORMAL LOW (ref 6.5–8.1)

## 2018-11-08 LAB — TRIGLYCERIDES: Triglycerides: 83 mg/dL (ref <150)

## 2018-11-08 LAB — PREALBUMIN: Prealbumin: 10.8 mg/dL — ABNORMAL LOW (ref 18–38)

## 2018-11-08 LAB — MAGNESIUM: MAGNESIUM: 1.7 mg/dL (ref 1.7–2.4)

## 2018-11-08 MED ORDER — TRAVASOL 10 % IV SOLN
INTRAVENOUS | Status: AC
  Administered 2018-11-08: 17:00:00 via INTRAVENOUS
  Filled 2018-11-08: qty 720

## 2018-11-08 MED ORDER — ENOXAPARIN SODIUM 60 MG/0.6ML ~~LOC~~ SOLN
1.0000 mg/kg | Freq: Two times a day (BID) | SUBCUTANEOUS | Status: DC
  Administered 2018-11-08 – 2018-11-09 (×4): 55 mg via SUBCUTANEOUS
  Filled 2018-11-08 (×5): qty 0.6

## 2018-11-08 MED ORDER — LIP MEDEX EX OINT
TOPICAL_OINTMENT | CUTANEOUS | Status: DC | PRN
  Filled 2018-11-08: qty 7

## 2018-11-08 NOTE — Progress Notes (Signed)
PROGRESS NOTE    Ryan Medina  ALP:[id number] DOB: [birth year] DOA: 09/03/2018 PCP: Patient, No Pcp Per  Brief Narrative:  58 year old with history of peripheral vascular disease, dilated cardiomyopathy, essential hypertension, diabetes mellitus type 2 initially came to the hospital with right lower extremity pain and was found to have ischemic leg requiring embolectomy on 12/13. He was placed on heparin drip subsequently developed right lower extremity hematoma requiring fasciotomy on 12/15. Unfortunately afterwards he decompensated with active GI bleed and was intubated and was moved to the ICU. He required embolization on 12/17. During the course of his hospitalization he is required total of 26 units of blood. Further evaluation showed pancreatic cancer with duodenal ulcer. Transferred to [place] [facility] for radiation treatment to help with bleeding. Hospital course complicated by sepsis from healthcare acquired pneumonia and Pseudomonas UTI requiring treatment with vancomycin and cefepime.Patient found to have chronic PE therefore started on heparin drip per oncology. Repeat endoscopy showed worsening of gastric outlet obstruction. Initially NG tube placed by patient removed it. Right now on clear liquid diet Assessment & Plan:   Principal Problem:   Popliteal artery embolus (HCC) Active Problems:   DM (diabetes mellitus), type 2, uncontrolled (HCC)   Essential hypertension   Dilated cardiomyopathy (HCC)   Chest pain   AAA (abdominal aortic aneurysm) (HCC)   Ischemic foot   Right leg claudication (HCC)   PVD (peripheral vascular disease) ([place])   Hemorrhagic shock ([place])   Nontraumatic ischemic infarction of muscle of right lower leg   Upper GI bleed   Cocaine abuse ([place])   Acute blood loss anemia   Chronic pain syndrome   Duodenal ulcer with hemorrhage   Pancreatic cancer ([place])   Goals of care, counseling/discussion   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 Status post radiation. Per oncology plans for outpatient radiation and chemotherapy -Status post endoscopy 12/17, GDA embolization 12/17, status post radiation. -Continue PPI. -Endoscopy showed worsening of gastric outlet obstruction/pyloric stenosis secondary to malignancy. Candida esophagitis -S/Plaparoscopy open gastrojejunostomy no pancreatic mass amenable to biopsy -PICC WAS NOT PLACED AS HE HAD A PORT .Continue TPN. KUB last night shows moderate fecal loading.  Patient was given stool softeners and had multiple bowel movements overnight and this morning his abdomen looks much soft and much less tender.  Surgery to start sips.  Candida esophagitis finish the course of Diflucan.  Chronic bilateral pulmonary embolismstarted Lovenox  Anemia of chronic disease Status post multiple blood transfusions 24 blood transfusions Had bloody bowel movements last night probably secondary to constipation hemoglobin 7.8 will monitor.   Hypokalemia/hypomagnesemia -Repletion ordered.  Sepsis secondary to H CAP and Pseudomonas urinary tract infection -Status post treatment with vancomycin and cefepime. PICC line pulled out 1/26.  Ischemic right foot with right popliteal and tibial artery embolus -Status post embolectomy. Complicated by compartment syndrome requiring fasciotomy.  Cardiomyopathy, combined  systolic and diastolic CHF, chronic -Patient appears to be euvolemic in nature. Continue to provide supportive  care.  Uncontrolled diabetes mellitus type 2 -Hemoglobin A1c 18.2. Continue Lantus, Accu-Chek and sliding scale. Blood glucose in acceptable range  Severe protein calorie malnutrition Continue TPN  Generalized weakness-physical therapy/Occupational Therapy-recommending skilled nursing facility Nutrition Problem: Severe Malnutrition Etiology: chronic illness(Pancreatic cancer, DM and CHF)  DVT prophylaxis:Lovenox Code Status:Okay for intubation partial code Family Communication:No family available Disposition Plan: Unknown at this time   Consultants:Gastroenterology  Oncology  Interventional radiology  Critical care  Vascular surgery  Palliative care  General surgery Cardiology  Procedures:Endoscopy 12/17 EUS 12/26 Chemo-Port placement 1/21/2020repeat endoscopy 2/7  antimicrobials Finished course of vancomycin and cefepime  Pressure Injury 11/03/18 Stage II -  Partial thickness loss of dermis presenting as a shallow open ulcer with a red, pink wound bed without slough. granulation, no drainage noted (Active)  11/03/18 0820  Location: Coccyx  Location Orientation: Mid;Upper  Staging: Stage II -  Partial thickness loss of dermis presenting as a shallow open ulcer with a red, pink wound bed without slough.  Wound Description (Comments): granulation, no drainage noted  Present on Admission: No      Nutrition Problem: Severe Malnutrition Etiology: chronic illness(Pancreatic cancer, DM and CHF)     Signs/Symptoms: severe muscle depletion, severe fat depletion, percent weight loss Percent weight loss: 23 %    Interventions: TPN  Estimated body mass index is 17.29 kg/m as calculated from the following:   Height as of this encounter: 5\' 11"  (1.803 m).   Weight as of this encounter: 56.2 kg.  Subjective:  Patient resting in bed  smiling reports feeling better after moving bowels abdomen less distended and softer appetite is back Objective: Vitals:   11/07/18 2035 11/08/18 0038 11/08/18 0124 11/08/18 0601  BP: 113/89 (!) 125/96 111/85 (!) 117/91  Pulse: (!) 57 (!) 106 (!) 107 96  Resp: 20 16 16 16   Temp: 97.9 F (36.6 C) 97.8 F (36.6 C) 98 F (36.7 C) 97.9 F (36.6 C)  TempSrc: Oral Oral Oral Oral  SpO2: 91% 100% 100% 100%  Weight:      Height:        Intake/Output Summary (Last 24 hours) at 11/08/2018 0826 Last data filed at 11/08/2018 0737 Gross per 24 hour  Intake 0 ml  Output 1500 ml  Net -1500 ml   Filed Weights   10/29/18 1219 11/02/18 1806 11/04/18 0831  Weight: 54.7 kg 56.2 kg 56.2 kg    Examination:  General exam: Appears calm and comfortable  Respiratory system: Clear to auscultation. Respiratory effort normal. Cardiovascular system: S1 & S2 heard, RRR. No JVD, murmurs, rubs, gallops or clicks. No pedal edema. Gastrointestinal system: Abdomen is  LESS distended, soft and less tender. No organomegaly or masses felt. Normal bowel sounds heard. Central nervous system: Alert and oriented. No focal neurological deficits. Extremities: Symmetric 5 x 5 power. Skin: No rashes, lesions or ulcers Psychiatry: Judgement and insight appear normal. Mood & affect appropriate.     Data Reviewed: I have personally reviewed following labs and imaging studies  CBC: Recent Labs  Lab 11/02/18 0451 11/03/18 0444 11/04/18 0344 11/06/18 0641 11/08/18 0111 11/08/18 0455  WBC 7.9 9.3 9.7 9.4 9.5 6.2  NEUTROABS 6.3  --   --  8.0*  --  5.0  HGB 7.5* 10.5* 10.6* 10.6* 8.1* 7.8*  HCT 24.6* 33.5* 32.7* 33.7* 26.6* 25.2*  MCV 89.1 89.5 87.9 90.3 93.3 91.6  PLT 320 332 329 318 347 676   Basic Metabolic Panel: Recent Labs  Lab 11/04/18 0344 11/05/18 0814  11/06/18 0641 11/07/18 0608 11/08/18 0455  NA 135 132* 135 137 135  K 4.1 3.7 4.2 3.3* 4.8  CL 99 100 100 99 100  CO2 26 21* 27 28 27   GLUCOSE  104* 195* 191* 144* 159*  BUN 6 14 10 9  28*  CREATININE 0.42* 0.51* 0.40* 0.41* 0.49*  CALCIUM 7.9* 8.2* 8.2* 8.1* 8.0*  MG 1.7 1.5* 1.7 1.8 1.7  PHOS  --   --  3.7 3.1 2.9   GFR: Estimated Creatinine Clearance: 81 mL/min (A) (by C-G formula based on SCr of 0.49 mg/dL (L)). Liver Function Tests: Recent Labs  Lab 11/02/18 0451 11/03/18 0444 11/04/18 0344 11/06/18 0641 11/08/18 0455  AST 16 17 15 15 21   ALT 15 17 17 15 22   ALKPHOS 64 64 69 59 65  BILITOT 0.4 0.6 0.3 0.4 0.3  PROT 6.0* 6.2* 6.6 6.1* 6.2*  ALBUMIN 1.4* 1.6* 1.5* 1.6* 1.6*   No results for input(s): LIPASE, AMYLASE in the last 168 hours. No results for input(s): AMMONIA in the last 168 hours. Coagulation Profile: Recent Labs  Lab 11/02/18 0451  INR 1.31   Cardiac Enzymes: No results for input(s): CKTOTAL, CKMB, CKMBINDEX, TROPONINI in the last 168 hours. BNP (last 3 results) No results for input(s): PROBNP in the last 8760 hours. HbA1C: No results for input(s): HGBA1C in the last 72 hours. CBG: Recent Labs  Lab 11/07/18 0741 11/07/18 1135 11/07/18 1716 11/08/18 0406 11/08/18 0729  GLUCAP 229* 277* 169* 172* 131*   Lipid Profile: Recent Labs    11/06/18 0641 11/08/18 0455  TRIG 67 83   Thyroid Function Tests: No results for input(s): TSH, T4TOTAL, FREET4, T3FREE, THYROIDAB in the last 72 hours. Anemia Panel: No results for input(s): VITAMINB12, FOLATE, FERRITIN, TIBC, IRON, RETICCTPCT in the last 72 hours. Sepsis Labs: No results for input(s): PROCALCITON, LATICACIDVEN in the last 168 hours.  No results found for this or any previous visit (from the past 240 hour(s)).       Radiology Studies: Dg Abd 1 View  Result Date: 11/07/2018 CLINICAL DATA:  Abdominal pain. EXAM: ABDOMEN - 1 VIEW COMPARISON:  October 29, 2018 FINDINGS: Moderate fecal loading in the colon. No evidence of bowel obstruction. There is a paucity of small bowel gas but no evidence of obstruction. No other acute  abnormalities. IMPRESSION: Moderate fecal loading in the colon. No other acute abnormalities noted. Electronically Signed   By: Dorise Bullion III M.D   On: 11/07/2018 18:37        Scheduled Meds: . enoxaparin (LOVENOX) injection  1 mg/kg Subcutaneous BID  . heparin lock flush  500 Units Intracatheter Q30 days  . insulin aspart  0-20 Units Subcutaneous Q4H  . pantoprazole (PROTONIX) IV  40 mg Intravenous Q12H   Continuous Infusions: . methocarbamol (ROBAXIN) IV 500 mg (11/08/18 0508)  . TPN ADULT (ION) 60 mL/hr at 11/07/18 1738  . TPN ADULT (ION)       LOS: 66 days     Georgette Shell, MD [facility]  If 7PM-7AM, please contact night-coverage [email or web address] Password [facility] 11/08/2018, 8:26 AM

## 2018-11-08 NOTE — Progress Notes (Signed)
I very much appreciate surgery's outstanding help with Ryan Medina.  He underwent surgery on Thursday.  Unfortunately, there is no pancreatic mass to biopsy.  He does feel better.  He does not have any abdominal distention.  He is on TNA right now.  Hopefully, he will have clear liquids soon.  He is on Lovenox for his thromboembolic disease.  He is out of bed a little bit.  His CBC shows white cell count 6.2.  His hemoglobin 7.8.  His platelet count 297,000.  His electrolytes show a BUN of 28 creatinine 0.49.  His blood sugar is 159.  His albumin is 1.6.  Prealbumin is 10.8.  On his physical exam, his temperature is 97.9.  Pulse is 96.  Blood pressure 117/91.  His head exam shows no scleral icterus.  There is no ocular or oral lesions.  He has no adenopathy in the neck.  Lungs are clear bilaterally.  Cardiac exam regular rate and rhythm with no murmurs, rubs or bruits.  Abdomen is soft.  Has a healing laparotomy scar.  He has no obvious fluid wave.  There is no palpable hepatomegaly.  Extremities shows the muscle atrophy in upper and lower extremities.  Neurological exam shows no focal neurological deficits.  Hopefully, Ryan Medina will be able to have some more substantial intake.  It will still take a little bit for this occurs.  I appreciate everybody's help with Ryan Medina.  I know this is been a very difficult situation with him being homeless and not having a place to stay.  He does have quite a few medical issues that are active.  [name], MD  [place] 14:14

## 2018-11-08 NOTE — Progress Notes (Signed)
Ryan Medina for Heparin transition to Lovenox Indication: PE, DVT  Allergies  Allergen Reactions  . Lisinopril Anaphylaxis   Patient Measurements: Height: 5\' 11"  (180.3 cm) Weight: 124 lb (56.2 kg) IBW/kg (Calculated) : 75.3 Heparin Dosing Weight: 66.7  Vital Signs: Temp: 97.9 F (36.6 C) (02/17 0601) Temp Source: Oral (02/17 0601) BP: 117/91 (02/17 0601) Pulse Rate: 96 (02/17 0601)  Labs: Recent Labs    11/05/18 0814  11/06/18 0641 11/07/18 0608 11/08/18 0111 11/08/18 0455  HGB  --    < > 10.6*  --  8.1* 7.8*  HCT  --   --  33.7*  --  26.6* 25.2*  PLT  --   --  318  --  347 297  HEPARINUNFRC 0.13*  --   --   --   --   --   CREATININE 0.51*  --  0.40* 0.41*  --  0.49*   < > = values in this interval not displayed.   Estimated Creatinine Clearance: 81 mL/min (A) (by C-G formula based on SCr of 0.49 mg/dL (L)).  Assessment: 58 yo male with arterial embolus s/p embolectomy. Patient had development of hematoma with compartment syndrome as well as hemorrhagic shock secondary to a bleeding duodenal ulcer.   Events this admission on 09/03/18:  LLE DVT/ischemia s/p urgent left popliteal artery embolectomy 12/13 >> RLE hematoma on 12/15 am s/p evacuation in OR.Heparin stopped 2/2 drop in hgb, hematemesis. -Heparin cautiously started 12/22,had few episodes of hematemesis & nose bleed 12/24> stopped heparin per dr. Candiss Norse S/p EUS 12/26:  pancreatic mass eroding into duodenum -VVS notes ikely too high risk for [facility]  12/28- Dr. Candiss Norse noted Continue PPI and Hold Heparin indefinitely  2/5: pharmacy consulted to dose heparin with no bolus by Dr Marin Olp for acute PE. Previously on 12/24 his heparin level was therapeutic at 0.35 on 1200 units/hr. 2/7: Heparin off at 09 am for EDG, heparin to resume after NG placed per GI instructions. KUB 1629> NG in stomach 2/13: Expl lap & Gastrojejunostomy- not able to use jejunostomy for enteral feeds. Heparin  resumed at 2000 at rate of 1600 units/hr  Today, 11/08/2018 - H/H with significant decrease (Hbg 7.8) - PLT stable - no bleeding reported  Goal of Therapy:  Monitor platelets by anticoagulation protocol: Yes     Plan: changed 12n to Lovenox  Change Lovenox to 55 mg q12h (1mg /kg) due to weight change  Daily CBC x 2  Monitor for bleeding  Ulice Dash, PharmD Clinical Pharmacist Pager # [phone]  11/08/2018, 7:56 AM

## 2018-11-08 NOTE — Progress Notes (Signed)
PHARMACY - ADULT TOTAL PARENTERAL NUTRITION CONSULT NOTE   Pharmacy Consult for TPN  Indication: gastric outlet obstruction 2/2 advanced pancreatic adenocarcinoma s/p laparoscopy, open gastrojejunostomy on 2/13  Patient Measurements: Height: 5' 11"  (180.3 cm) Weight: 124 lb (56.2 kg) IBW/kg (Calculated) : 75.3 TPN AdjBW (KG): 56.2 Body mass index is 17.29 kg/m.   Insulin Requirements: 16 units SSI in past 24 hours + levemir 4 units daily   Current Nutrition: TPN; clear liquid diet --> full liquid diet   IVF: d/c 2/16  Central access: port TPN start date: 11/05/2018  ASSESSMENT                                                                                                          HPI: 8 y/oM with RLE pain/numbness, DVT and limb ischemia > urgent arteriography & popliteal embolectomy on 12/13 > placed on heparin drip, developed RLE hematoma requiring fasciotomy 12/15 > hemorrhagic shock due to GI bleed, AC stopped > heparin resumed 2/5 for PE > change to lovenox 2/14 - GOO secondary to pancreatic Ca, s/p laparoscopy, open gastrojejunostomy on 2/13, pt removed NGT in PACU -pharmacy consulted to start TPN 2/14  Significant events:   2/15: try clear liquids per CCS  2/16: advance to full liquid diet per MD  2/17: increased abdominal pain and distension overnight  Today: 11/08/18    No po intake > 24h per chart  Glucose - CBGs 159-277 in past 24 hours (goal < 150)  Electrolytes -  WNL, K w/ significant increase   Renal - SCr stable  LFTs - Albumin low; AST/ALT, Alk Phos, Tbili WNL (2/17)  TGs - 83 (2/17)  Prealbumin - 5.1 (2/11), 5.6 (2/15), 10.8 (2/17)  NUTRITIONAL GOALS                                                                                             RD recs (2/14):  Kcal:  2000-2200 kcal Protein:  100-110 grams  Custom TPN at goal rate of 73m/hr provides: - 102 g/day protein (50 g/L) - 61.2 g/day Lipid  (30 g/L) - 306 g/day Dextrose (15 %) -   2060 Kcal/day   PLAN  At 1800 today:  Continue TPN at 60 ml/hr. -Provides1454 kCals, 72g proteinper day,meeting~ 70% of goal kcal and goal AA -not advancing TPN rate today as CBGs not well controlled  Electrolytes in TPN: Continue increased Na+ at 60 mEq/L, increase mg to 10 mEq/L, decrease K back to 60 mEq/L, continue standard Ca and Phos, Cl:Ac ratio 1:1  Plan to advance as tolerated to the goal rate.  TPN to contain standard multivitamins and trace elements.  Change SSI from moderate to resistant scale q4h. Discontinue Levemir and add 10 units regular insulin to TPN for better blood sugar control to hopefully allow for titration to goal  TPN lab panels on Mondays & Thursdays.  BMET, Mg, Phos in AM  F/u daily.  Ulice Dash, PharmD Clinical Pharmacist Pager # [phone]  11/08/18 7:43 AM

## 2018-11-08 NOTE — Progress Notes (Signed)
Physical Therapy Treatment Patient Details Name: Ryan Medina MRN: [id number] DOB: Mar 22, 1961 Today's Date: 11/08/2018    History of Present Illness 58 y.o. male with PMH including but not limited to DM and HTN who presented to the hospital on 12/13 with a right ischemic leg secondary to acute popliteal and tibial embolus, underwent embolectomy on 12/13 and subsequently was placed on a heparin drip.  He unfortunately developed a acute right lower leg hematoma with compartment syndrome requiring fasciotomy on 12/15.  Further hospital course was complicated by development of hemorrhagic shock with acute blood loss anemia secondary to a bleeding duodenal ulcer-requiring ICU transfer-intubation for airway protection and IR evaluation with mesenteric angiogram and GDA embolization.  Upon stability he was transferred back to the triad hospitalist service on 12/19. Pt is now s/p closure of R LE fasciotomy on 09/10/18. 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  Plans to transfer to [facility] for radiation treatment.     PT Comments    Pt able to tolerate limited OOB activity this session, limited by abdominal and foot pain. Pt tolerated LE exercises well. PT to continue to progress mobility as tolerated.   Follow Up Recommendations  SNF     Equipment Recommendations  Rolling walker with 5" wheels;3in1 (PT);Wheelchair (measurements PT);Wheelchair cushion (measurements PT)    Recommendations for Other Services       Precautions / Restrictions Precautions Precautions: Fall Precaution Comments: abdominal - pt with abdominal surgery 11/04/18  Required Braces or Orthoses: Other Brace Other Brace: CAM boot Restrictions Weight Bearing Restrictions: No RLE Weight Bearing: Weight bearing as tolerated    Mobility  Bed Mobility Overal bed mobility: Needs Assistance Bed Mobility: Supine to Sit     Supine to sit: Supervision     General bed mobility comments:  Supervision for safety, increased time and effort. Required seated rest break after supine to sit for abdominal pain.   Transfers Overall transfer level: Needs assistance Equipment used: Rolling walker (2 wheeled) Transfers: Sit to/from [employer] Sit to Stand: Min guard;From elevated surface Stand pivot transfers: Min guard;From elevated surface       General transfer comment: Sit to stand x2 from elevated bed height, first stand pt stood for 10 seconds and required seated rest due to abdominal and R foot pain. Pt unable to tolerate ambulation with RW due to pain, discovered after attempting to take a step. Pt able to transfer to recliner at bedside with min guard for safety, increased time to perform.  Ambulation/Gait Ambulation/Gait assistance: (NT )               Stairs             Wheelchair Mobility    Modified Rankin (Stroke Patients Only)       Balance Overall balance assessment: Needs assistance Sitting-balance support: No upper extremity supported Sitting balance-Leahy Scale: Good     Standing balance support: Bilateral upper extremity supported Standing balance-Leahy Scale: Poor Standing balance comment: relies on RW                             Cognition Arousal/Alertness: Awake/alert Behavior During Therapy: WFL for tasks assessed/performed Overall Cognitive Status: Within Functional Limits for tasks assessed  Exercises General Exercises - Lower Extremity Ankle Circles/Pumps: AROM;Supine;20 reps;Both(R ankle DF AROM -15*, AAROM -5*) Gluteal Sets: AROM;Both;10 reps;Seated Long Arc Quad: AROM;Both;20 reps;Seated Hip ABduction/ADduction: Supine;AAROM;Both;10 reps    General Comments        Pertinent Vitals/Pain Pain Assessment: 0-10 Pain Score: 9  Pain Location: abdomen  Pain Descriptors / Indicators: Sore;Discomfort Pain Intervention(s): Limited activity  within patient's tolerance;Repositioned;Monitored during session;Premedicated before session    Home Living                      Prior Function            PT Goals (current goals can now be found in the care plan section) Acute Rehab PT Goals Patient Stated Goal: decrease leg pain with mobility PT Goal Formulation: With patient/family Time For Goal Achievement: 11/12/18 Potential to Achieve Goals: Fair Progress towards PT goals: Progressing toward goals    Frequency    Min 2X/week      PT Plan Current plan remains appropriate    Co-evaluation              AM-PAC PT "6 Clicks" Mobility   Outcome Measure  Help needed turning from your back to your side while in a flat bed without using bedrails?: A Little Help needed moving from lying on your back to sitting on the side of a flat bed without using bedrails?: A Little Help needed moving to and from a bed to a chair (including a wheelchair)?: A Little Help needed standing up from a chair using your arms (e.g., wheelchair or bedside chair)?: A Little Help needed to walk in hospital room?: A Lot Help needed climbing 3-5 steps with a railing? : A Lot 6 Click Score: 16    End of Session Equipment Utilized During Treatment: (Pt defers gait belt ) Activity Tolerance: Patient limited by pain Patient left: in chair;with call bell/phone within reach;with nursing/sitter in room Nurse Communication: Mobility status PT Visit Diagnosis: Other abnormalities of gait and mobility (R26.89);Muscle weakness (generalized) (M62.81) Pain - Right/Left: Right Pain - part of body: Ankle and joints of foot(abdomen )     Time: 0370-4888 PT Time Calculation (min) (ACUTE ONLY): 26 min  Charges:  $Therapeutic Exercise: 8-22 mins $Therapeutic Activity: 8-22 mins                     Maverick Patman Conception Chancy, PT Acute Rehabilitation Services Pager [phone]  Office [phone]   [place] [address] Shakelia Scrivner 11/08/2018, 2:03 PM

## 2018-11-08 NOTE — Progress Notes (Signed)
4 Days Post-Op    CC: Nonresectable pancreatic cancer with gastric outlet syndrome  Subjective: Pt feels better this AM.  Stomach distended not emptying yesterday. He was made NPO, better this AM.  Some bloody stools and dropping HBG.  He looks fairly good this AM, also on Lovenox.     Objective: Vital signs in last 24 hours: Temp:  [97.7 F (36.5 C)-98 F (36.7 C)] 97.9 F (36.6 C) (02/17 0601) Pulse Rate:  [57-124] 96 (02/17 0601) Resp:  [16-20] 16 (02/17 0601) BP: (100-125)/(76-96) 117/91 (02/17 0601) SpO2:  [91 %-100 %] 100 % (02/17 0601) Last BM Date: 11/06/18 NO intake recorded No BM, urine 1100 Afebrile, VSS Prealbumin up to 10.8. Still anemic  Intake/Output from previous day: 02/16 0701 - 02/17 0700 In: -  Out: 1100 [Urine:1100] Intake/Output this shift: Total I/O In: -  Out: 400 [Urine:400]  General appearance: alert, cooperative and no distress GI: soft, non-tender; bowel sounds normal; no masses,  no organomegaly and midline incision is OK, dressing still in place  Lab Results:  Recent Labs    11/08/18 0111 11/08/18 0455  WBC 9.5 6.2  HGB 8.1* 7.8*  HCT 26.6* 25.2*  PLT 347 297    BMET Recent Labs    11/07/18 0608 11/08/18 0455  NA 137 135  K 3.3* 4.8  CL 99 100  CO2 28 27  GLUCOSE 144* 159*  BUN 9 28*  CREATININE 0.41* 0.49*  CALCIUM 8.1* 8.0*   PT/INR No results for input(s): LABPROT, INR in the last 72 hours.  Recent Labs  Lab 11/02/18 0451 11/03/18 0444 11/04/18 0344 11/06/18 0641 11/08/18 0455  AST 16 17 15 15 21   ALT 15 17 17 15 22   ALKPHOS 64 64 69 59 65  BILITOT 0.4 0.6 0.3 0.4 0.3  PROT 6.0* 6.2* 6.6 6.1* 6.2*  ALBUMIN 1.4* 1.6* 1.5* 1.6* 1.6*     Lipase     Component Value Date/Time   LIPASE 42 08/22/2018 1932     Medications: . enoxaparin (LOVENOX) injection  60 mg Subcutaneous BID  . heparin lock flush  500 Units Intracatheter Q30 days  . insulin aspart  0-20 Units Subcutaneous Q4H  . insulin detemir  4  Units Subcutaneous Daily  . pantoprazole (PROTONIX) IV  40 mg Intravenous Q12H   . methocarbamol (ROBAXIN) IV 500 mg (11/08/18 0508)  . 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 Chronic pain/hx of substance abuse -ETOH/Cocaine/tobacco Anemia H/H: 7.8/25.2 Hx exploratory laparotomy,closure of pyloric channel ulcer with Phillip Heal patch 12/2003 Dr. Harlow Asa Cocaine positive 09/27/18 Hypokalemia/hypomagnesemia Severe malnutrition-prealbumin 5.1   Hemorrhagic shock secondary to duodenalulcer-GI bleed/embolization 09/07/2018 with radiation therapy/chemotherapy pending Nonresectable pancreatic cancer with gastric outlet syndrome/liver metastasis Laparoscopy with open gastrojejunostomy 11/04/2018, Dr. Johnathan Hausen  FEN:IV fluids/TPN   ID: Diflucan 2/7>>2/12 (candida esophagitis) cefotetan preop 11/04/2018 DVT: SCD/Lovenox Follow up: TBD  Plan:  I will start him back on sips and chips, concerned he is not emptying stomach well.  It may be ileus. Continue TNA, and go slow with PO's.  Will defer anemia to Primary  LOS: 66 days    Ryan Medina 11/08/2018 [phone]

## 2018-11-09 LAB — PHOSPHORUS: Phosphorus: 3.9 mg/dL (ref 2.5–4.6)

## 2018-11-09 LAB — COMPREHENSIVE METABOLIC PANEL
ALT: 24 U/L (ref 0–44)
AST: 23 U/L (ref 15–41)
Albumin: 1.7 g/dL — ABNORMAL LOW (ref 3.5–5.0)
Alkaline Phosphatase: 64 U/L (ref 38–126)
Anion gap: 6 (ref 5–15)
BUN: 17 mg/dL (ref 6–20)
CO2: 29 mmol/L (ref 22–32)
Calcium: 8 mg/dL — ABNORMAL LOW (ref 8.9–10.3)
Chloride: 99 mmol/L (ref 98–111)
Creatinine, Ser: 0.42 mg/dL — ABNORMAL LOW (ref 0.61–1.24)
GFR calc Af Amer: >60 mL/min (ref >60)
GFR calc non Af Amer: >60 mL/min (ref >60)
GLUCOSE: 128 mg/dL — AB (ref 70–99)
Potassium: 4.1 mmol/L (ref 3.5–5.1)
Sodium: 134 mmol/L — ABNORMAL LOW (ref 135–145)
Total Bilirubin: 0.2 mg/dL — ABNORMAL LOW (ref 0.3–1.2)
Total Protein: 6.1 g/dL — ABNORMAL LOW (ref 6.5–8.1)

## 2018-11-09 LAB — GLUCOSE, CAPILLARY
GLUCOSE-CAPILLARY: 77 mg/dL (ref 70–99)
Glucose-Capillary: 112 mg/dL — ABNORMAL HIGH (ref 70–99)
Glucose-Capillary: 156 mg/dL — ABNORMAL HIGH (ref 70–99)
Glucose-Capillary: 123 mg/dL — ABNORMAL HIGH (ref 70–99)
Glucose-Capillary: 97 mg/dL (ref 70–99)
Glucose-Capillary: 231 mg/dL — ABNORMAL HIGH (ref 70–99)

## 2018-11-09 LAB — MAGNESIUM: Magnesium: 1.9 mg/dL (ref 1.7–2.4)

## 2018-11-09 LAB — CBC
HCT: 22.5 % — ABNORMAL LOW (ref 39.0–52.0)
HEMOGLOBIN: 7 g/dL — AB (ref 13.0–17.0)
MCH: 27.7 pg (ref 26.0–34.0)
MCHC: 31.1 g/dL (ref 30.0–36.0)
MCV: 88.9 fL (ref 80.0–100.0)
Platelets: 303 10*3/uL (ref 150–400)
RBC: 2.53 MIL/uL — ABNORMAL LOW (ref 4.22–5.81)
RDW: 14.9 % (ref 11.5–15.5)
WBC: 6.8 10*3/uL (ref 4.0–10.5)
nRBC: 0 % (ref 0.0–0.2)

## 2018-11-09 LAB — PREPARE RBC (CROSSMATCH)

## 2018-11-09 MED ORDER — TRAVASOL 10 % IV SOLN
INTRAVENOUS | Status: DC
  Filled 2018-11-09: qty 1020

## 2018-11-09 MED ORDER — TRAVASOL 10 % IV SOLN
INTRAVENOUS | Status: AC
  Administered 2018-11-09: 17:00:00 via INTRAVENOUS
  Filled 2018-11-09: qty 1020

## 2018-11-09 MED ORDER — SODIUM CHLORIDE 0.9% IV SOLUTION
Freq: Once | INTRAVENOUS | Status: AC
  Administered 2018-11-09: 08:00:00 via INTRAVENOUS

## 2018-11-09 NOTE — Progress Notes (Signed)
PROGRESS NOTE    Ryan Medina  ELF:[id number] DOB: 1961-01-01 DOA: 09/03/2018 PCP: Patient, No Pcp Per  Brief Narrative:58 year old with history of peripheral vascular disease, dilated cardiomyopathy, essential hypertension, diabetes mellitus type 2 initially came to the hospital with right lower extremity pain and was found to have ischemic leg requiring embolectomy on 12/13. He was placed on heparin drip subsequently developed right lower extremity hematoma requiring fasciotomy on 12/15. Unfortunately afterwards he decompensated with active GI bleed and was intubated and was moved to the ICU. He required embolization on 12/17. During the course of his hospitalization he is required total of 26 units of blood. Further evaluation showed pancreatic cancer with duodenal ulcer. Transferred to [facility] for radiation treatment to help with bleeding. Hospital course complicated by sepsis from healthcare acquired pneumonia and Pseudomonas UTI requiring treatment with vancomycin and cefepime.Patient found to have chronic PE therefore started on heparin drip per oncology. Repeat endoscopy showed worsening of gastric outlet obstruction. Initially NG tube placed by patient removed it. Right now on clear liquid diet  Assessment & Plan:   Principal Problem:   Popliteal artery embolus (HCC) Active Problems:   DM (diabetes mellitus), type 2, uncontrolled (HCC)   Essential hypertension   Dilated cardiomyopathy (HCC)   Chest pain   AAA (abdominal aortic aneurysm) (HCC)   Ischemic foot   Right leg claudication (HCC)   PVD (peripheral vascular disease) ([place])   Hemorrhagic shock ([place])   Nontraumatic ischemic infarction of muscle of right lower leg   Upper GI bleed   Cocaine abuse ([place])   Acute blood loss anemia   Chronic pain syndrome   Duodenal ulcer with hemorrhage   Pancreatic cancer ([place])   Goals of care, counseling/discussion   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 Status post radiation. Per oncology plans for outpatient radiation and chemotherapy -Status post endoscopy 12/17, GDA embolization 12/17, status post radiation. -Continue PPI. -Endoscopy showed worsening of gastric outlet obstruction/pyloric stenosis secondary to malignancy. Candida esophagitis -S/Plaparoscopy open gastrojejunostomy no pancreatic mass amenable to biopsy -PICC WAS NOT PLACED AS HE HAD A PORT .Continue TPN. Advance diet per surgery.  Candida esophagitis finish the course of Diflucan.  Chronic bilateral pulmonary embolismstarted Lovenox  Anemia of chronic disease Status post multiple blood transfusions24 blood transfusions Had bloody bowel movements last night probably secondary to constipation hemoglobin 7.0.  Will transfuse 1 unit of packed RBC today.  No evidence of active bleeding.  Hypokalemia/hypomagnesemia -Repletion ordered.  Sepsis secondary to H CAP and Pseudomonas urinary tract infection -Status post treatment with vancomycin and cefepime. PICC line pulled out 1/26.  Ischemic right foot with right popliteal and tibial artery embolus -Status post embolectomy. Complicated by compartment syndrome requiring fasciotomy.  Cardiomyopathy, combined systolic and diastolic CHF, chronic -Patient appears to be euvolemic in nature. Continue to provide supportive care.  Uncontrolled  diabetes mellitus type 2 -Hemoglobin A1c 18.2. Continue Lantus, Accu-Chek and sliding scale. Blood glucose in acceptable range  Severe protein  calorie malnutrition Continue TPN  Generalized weakness-physical therapy/Occupational Therapy-recommending skilled nursing facility Nutrition Problem: Severe Malnutrition  Etiology: chronic illness(Pancreatic   DVT prophylaxis:Lovenox Code Status:Okay for intubation partial code Family Communication:No family available Disposition Plan: Unknown at this time   Consultants:Gastroenterology  Oncology  Interventional radiology  Critical care  Vascular surgery  Palliative care  General surgery Cardiology  Procedures:Endoscopy 12/17 EUS 12/26 Chemo-Port placement 1/21/2020repeat endoscopy 2/7  antimicrobials Finished course of vancomycin and cefepime  Pressure Injury 11/03/18 Stage II -  Partial thickness loss of dermis presenting as a shallow open ulcer with a red, pink wound bed without slough. granulation, no drainage noted (Active)  11/03/18 0820  Location: Coccyx  Location Orientation: Mid;Upper  Staging: Stage II -  Partial thickness loss of dermis presenting as a shallow open ulcer with a red, pink wound bed without slough.  Wound Description (Comments): granulation, no drainage noted  Present on Admission: No      Nutrition Problem: Severe Malnutrition Etiology: chronic illness(Pancreatic cancer, DM and CHF)     Signs/Symptoms: severe muscle depletion, severe fat depletion, percent weight loss Percent weight loss: 23 %    Interventions: TPN  Estimated body mass index is 17.29 kg/m as calculated from the following:   Height as of this encounter: 5\' 11"  (1.803 m).   Weight as of this encounter: 56.2 kg.  Subjective:  Feels well is having flatus has not had a bowel movement overnight no nausea vomiting abdominal  pain better distention better Objective: Vitals:   11/08/18 2046 11/09/18 0616  11/09/18 0730 11/09/18 0755  BP: 105/83 (!) 130/98 (!) 126/97 (!) 117/96  Pulse: (!) 110 96 (!) 101 98  Resp: 16 18 18 18   Temp: 98.2 F (36.8 C) 98 F (36.7 C) (!) 97.4 F (36.3 C) (!) 97.4 F (36.3 C)  TempSrc: Oral Oral Axillary Axillary  SpO2: 99% 100% 100% 100%  Weight:      Height:        Intake/Output Summary (Last 24 hours) at 11/09/2018 0821 Last data filed at 11/09/2018 0618 Gross per 24 hour  Intake 2266.38 ml  Output 3875 ml  Net -1608.62 ml   Filed Weights   10/29/18 1219 11/02/18 1806 11/04/18 0831  Weight: 54.7 kg 56.2 kg 56.2 kg    Examination:  General exam: Appears calm and comfortable  Respiratory system: Clear to auscultation. Respiratory effort normal. Cardiovascular system: S1 & S2 heard, RRR. No JVD, murmurs, rubs, gallops or clicks. No pedal edema. Gastrointestinal system: Abdomen is nondistended, soft and tender. No organomegaly or masses felt. Normal bowel sounds heard. Central nervous system: Alert and oriented. No focal neurological deficits. Extremities: Symmetric 5 x 5 power. Skin: No rashes, lesions or ulcers Psychiatry: Judgement and insight appear normal. Mood & affect appropriate.     Data Reviewed: I have personally reviewed following labs and imaging studies  CBC: Recent Labs  Lab 11/04/18 0344 11/06/18 0641 11/08/18 0111 11/08/18 0455 11/09/18 0338  WBC 9.7 9.4 9.5 6.2 6.8  NEUTROABS  --  8.0*  --  5.0  --   HGB 10.6* 10.6* 8.1* 7.8* 7.0*  HCT 32.7* 33.7* 26.6* 25.2* 22.5*  MCV 87.9 90.3 93.3 91.6 88.9  PLT 329 318 347 297 263   Basic Metabolic Panel: Recent Labs  Lab 11/05/18 0814 11/06/18 0641 11/07/18 0608 11/08/18 0455 11/09/18 0338  NA 132* 135 137 135 134*  K 3.7 4.2 3.3* 4.8 4.1  CL 100 100 99 100 99  CO2 21* 27 28 27  29  GLUCOSE 195* 191* 144* 159* 128*  BUN 14 10 9  28* 17  CREATININE 0.51* 0.40* 0.41* 0.49* 0.42*  CALCIUM 8.2* 8.2* 8.1* 8.0* 8.0*  MG 1.5* 1.7 1.8 1.7 1.9  PHOS  --  3.7 3.1 2.9 3.9    GFR: Estimated Creatinine Clearance: 81 mL/min (A) (by C-G formula based on SCr of 0.42 mg/dL (L)). Liver Function Tests: Recent Labs  Lab 11/03/18 0444 11/04/18 0344 11/06/18 0641 11/08/18 0455 11/09/18 0338  AST 17 15 15 21 23   ALT 17 17 15 22 24   ALKPHOS 64 69 59 65 64  BILITOT 0.6 0.3 0.4 0.3 0.2*  PROT 6.2* 6.6 6.1* 6.2* 6.1*  ALBUMIN 1.6* 1.5* 1.6* 1.6* 1.7*   No results for input(s): LIPASE, AMYLASE in the last 168 hours. No results for input(s): AMMONIA in the last 168 hours. Coagulation Profile: No results for input(s): INR, PROTIME in the last 168 hours. Cardiac Enzymes: No results for input(s): CKTOTAL, CKMB, CKMBINDEX, TROPONINI in the last 168 hours. BNP (last 3 results) No results for input(s): PROBNP in the last 8760 hours. HbA1C: No results for input(s): HGBA1C in the last 72 hours. CBG: Recent Labs  Lab 11/08/18 1556 11/08/18 1939 11/08/18 2317 11/09/18 0412 11/09/18 0715  GLUCAP 148* 126* 70 112* 156*   Lipid Profile: Recent Labs    11/08/18 0455  TRIG 83   Thyroid Function Tests: No results for input(s): TSH, T4TOTAL, FREET4, T3FREE, THYROIDAB in the last 72 hours. Anemia Panel: No results for input(s): VITAMINB12, FOLATE, FERRITIN, TIBC, IRON, RETICCTPCT in the last 72 hours. Sepsis Labs: No results for input(s): PROCALCITON, LATICACIDVEN in the last 168 hours.  No results found for this or any previous visit (from the past 240 hour(s)).       Radiology Studies: Dg Abd 1 View  Result Date: 11/07/2018 CLINICAL DATA:  Abdominal pain. EXAM: ABDOMEN - 1 VIEW COMPARISON:  October 29, 2018 FINDINGS: Moderate fecal loading in the colon. No evidence of bowel obstruction. There is a paucity of small bowel gas but no evidence of obstruction. No other acute abnormalities. IMPRESSION: Moderate fecal loading in the colon. No other acute abnormalities noted. Electronically Signed   By: Dorise Bullion III M.D   On: 11/07/2018 18:37         Scheduled Meds: . enoxaparin (LOVENOX) injection  1 mg/kg Subcutaneous BID  . heparin lock flush  500 Units Intracatheter Q30 days  . insulin aspart  0-20 Units Subcutaneous Q4H  . pantoprazole (PROTONIX) IV  40 mg Intravenous Q12H   Continuous Infusions: . methocarbamol (ROBAXIN) IV 500 mg (11/09/18 0616)  . TPN ADULT (ION) 60 mL/hr at 11/08/18 1707  . TPN ADULT (ION)       LOS: 55 days    Georgette Shell, MD [facility]  If 7PM-7AM, please contact night-coverage [email or web address] Password [email or web address] 11/09/2018, 8:21 AM

## 2018-11-09 NOTE — Progress Notes (Signed)
PHARMACY - ADULT TOTAL PARENTERAL NUTRITION CONSULT NOTE   Pharmacy Consult for TPN  Indication: gastric outlet obstruction 2/2 advanced pancreatic adenocarcinoma s/p laparoscopy, open gastrojejunostomy on 2/13  Patient Measurements: Height: 5' 11"  (180.3 cm) Weight: 124 lb (56.2 kg) IBW/kg (Calculated) : 75.3 TPN AdjBW (KG): 56.2 Body mass index is 17.29 kg/m.   Insulin Requirements: 13 units SSI in past 24 hours + 10 units regular insulin in TPN (levemir 4 units daily d/c)  Current Nutrition: TPN; clear liquid diet --> full liquid diet   IVF: d/c 2/16  Central access: port TPN start date: 11/05/2018  ASSESSMENT                                                                                                          HPI: 40 y/oM with RLE pain/numbness, DVT and limb ischemia > urgent arteriography & popliteal embolectomy on 12/13 > placed on heparin drip, developed RLE hematoma requiring fasciotomy 12/15 > hemorrhagic shock due to GI bleed, AC stopped > heparin resumed 2/5 for PE > change to lovenox 2/14 - GOO secondary to pancreatic Ca, s/p laparoscopy, open gastrojejunostomy on 2/13, pt removed NGT in PACU -pharmacy consulted to start TPN 2/14  Significant events:   2/15: try clear liquids per CCS  2/16: advance to full liquid diet per MD  2/17: increased abdominal pain and distension overnight  Today: 11/09/18    No po intake > 24h per chart  Glucose - CBGs 70-156 in past 24 hours (goal < 150)  Electrolytes -  WNL, phos increasing   Renal - SCr stable  LFTs - Albumin low; AST/ALT, Alk Phos, Tbili WNL (2/18)  TGs - 83 (2/17)  Prealbumin - 5.1 (2/11), 5.6 (2/15), 10.8 (2/17)  NUTRITIONAL GOALS                                                                                             RD recs (2/14):  Kcal:  2000-2200 kcal Protein:  100-110 grams  Custom TPN at goal rate of 60m/hr provides: - 102 g/day protein (50 g/L) - 61.2 g/day Lipid  (30 g/L) - 306  g/day Dextrose (15 %) -  2060 Kcal/day   PLAN  At 1800 today:  Advance TPN to goal rate at 85 ml/hr. -Provides2060 kCals, 102g proteinper day,meeting100% of goal kcal and goal AA  Electrolytes in TPN: Continue increased Na+ at 60 mEq/L, continue Mg at 10 mEq/L,increase K  to 65 mEq/L, decrease phos to 15 mmol/L, continue standard Ca and Phos, Cl:Ac ratio 1:1  TPN to contain standard multivitamins and trace elements.  Continue SSI resistant scale q4h. Continue 10 units regular insulin in TPN - not increasing insulin in bag yet despite advancing to goal due to CBG of 70  TPN lab panels on Mondays & Thursdays.  BMET, Mg, Phos in AM  F/u daily.  Ulice Dash, PharmD Clinical Pharmacist Pager # [phone]  11/09/18 7:27 AM

## 2018-11-09 NOTE — Progress Notes (Addendum)
5 Days Post-Op    CC: Nonresectable pancreatic cancer with gastric outlet syndrome  Subjective: No complaints this AM.  Still seems a bit distended, no nausea or abdominal pain.  The midline incision is fine.  Being transfused now.  Objective: Vital signs in last 24 hours: Temp:  [97.4 F (36.3 C)-98.2 F (36.8 C)] 97.4 F (36.3 C) (02/18 0730) Pulse Rate:  [96-117] 101 (02/18 0730) Resp:  [16-20] 18 (02/18 0730) BP: (105-130)/(83-98) 126/97 (02/18 0730) SpO2:  [99 %-100 %] 100 % (02/18 0730) Last BM Date: 11/07/18 280 PO 1900 IV 4275 urine No BM Afebrile, BP still up some CMP stable, H/H down more   Intake/Output from previous day: 02/17 0701 - 02/18 0700 In: 2266.4 [P.O.:280; I.V.:1386.4; IV Piggyback:600] Out: 4275 [Urine:4275] Intake/Output this shift: No intake/output data recorded.  General appearance: alert, cooperative and no distress Resp: clear to auscultation bilaterally and No IS in the room. GI: soft, still somewhat distended.  incision is fine.  Few BS, + BM yesterday, no blood yesterday reported  Lab Results:  Recent Labs    11/08/18 0455 11/09/18 0338  WBC 6.2 6.8  HGB 7.8* 7.0*  HCT 25.2* 22.5*  PLT 297 303    BMET Recent Labs    11/08/18 0455 11/09/18 0338  NA 135 134*  K 4.8 4.1  CL 100 99  CO2 27 29  GLUCOSE 159* 128*  BUN 28* 17  CREATININE 0.49* 0.42*  CALCIUM 8.0* 8.0*   PT/INR No results for input(s): LABPROT, INR in the last 72 hours.  Recent Labs  Lab 11/03/18 0444 11/04/18 0344 11/06/18 0641 11/08/18 0455 11/09/18 0338  AST 17 15 15 21 23   ALT 17 17 15 22 24   ALKPHOS 64 69 59 65 64  BILITOT 0.6 0.3 0.4 0.3 0.2*  PROT 6.2* 6.6 6.1* 6.2* 6.1*  ALBUMIN 1.6* 1.5* 1.6* 1.6* 1.7*     Lipase     Component Value Date/Time   LIPASE 42 08/22/2018 1932     Medications: . enoxaparin (LOVENOX) injection  1 mg/kg Subcutaneous BID  . heparin lock flush  500 Units Intracatheter Q30 days  . insulin aspart  0-20 Units  Subcutaneous Q4H  . 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 Chronic pain/hx of substance abuse -ETOH/Cocaine/tobacco Anemia H/H: 7.8/25.2>>7.0/22.5;  being transfused 11/09/18 Hx exploratory laparotomy,closure of pyloric channel ulcer with Phillip Heal patch 12/2003 Dr. Harlow Asa Cocaine positive 09/27/18 Hypokalemia/hypomagnesemia Severe malnutrition-prealbumin 5.1 >>10.8   Hemorrhagic shock secondary to duodenalulcer-GI bleed/embolization 09/07/2018 with radiation therapy/chemotherapy pending Nonresectable pancreatic cancer with gastric outlet syndrome/liver metastasis Laparoscopy with open gastrojejunostomy 11/04/2018, Ryan Medina  FEN:IV fluids/TPN/ sips of clears >> clears from the floor ID: Diflucan 2/7>>2/12 (candida esophagitis) cefotetan preop 11/04/2018 DVT: SCD/Lovenox Follow up: TBD  Plan:  I will give him some clears from the floor, Dr. Hassell Done wanted to go slow with the [place].  Continue to mobilize.      LOS: 67 days    Ryan Medina 11/09/2018 [phone]

## 2018-11-09 NOTE — Progress Notes (Signed)
Around 1300; Pt had a significant amount of melanous loose stool. VSS

## 2018-11-09 NOTE — Progress Notes (Signed)
Nutrition Follow-up  DOCUMENTATION CODES:   Severe malnutrition in context of chronic illness, Underweight  INTERVENTION:   Monitor magnesium, potassium, and phosphorus daily for at least 3 days, MD to replete as needed, as pt is at risk for refeeding syndrome given poor PO intakes, severe malnutrition, and Low Mg levels.  -TPN per Pharmacy -Once diet is advanced, would resume Ensure Enlive po TID, each supplement provides 350 kcal and 20 grams of protein -Recommend new weight measurement (last recorded 2/13)  NUTRITION DIAGNOSIS:   Severe Malnutrition related to chronic illness(Pancreatic cancer, DM and CHF) as evidenced by severe muscle depletion, severe fat depletion, percent weight loss.  Ongoing  GOAL:   Patient will meet greater than or equal to 90% of their needs  Progressing.  MONITOR:   PO intake, Supplement acceptance, Weight trends, Labs  ASSESSMENT:   58 y.o male with PMH: recent dx pancreatic cancer, DM, CHF, crack cocaine use, emboli of legs, smoker. Pt admitted with admitted with DKA and GI bleed.   Significant events at [facility]: 12/13 embolectomy surgery for acute popliteal and tibial emboli 12/15 fasciotomy for RLE hematoma  12/17 Developed hemorrhagic shock secondary to bleeding duodenal ulcer requiring transfer to ICU for intubation 12/22 MRI showed pancreatic mass 12/26 Endoscopic ultrasound by Gi showed adenocarcinoma 1/5- Transferred back to ICU with hypotension anemia and GI bleed from invading pancreatic mass  [facility]: 1/7- Transferred to [facility] for radiation 1/22 -completed course of radiation 2/5: CT scan reveals enlargement of pancreatic mass, dilated stomach which is fluid and food filled 2/7 s/p EGD -tumor occluding the gastric outlet at the level of the pylorus. 2/8 -NGT pulled out, started on CLD 2/11: [place] Count completed, pt meeting 65-69% of estimated needs 2/13: LAPAROSCOPY EXPLORATORY LAPAROTOMY GASTROJEJUNOSTOMY  2/14: TPN initiated  via port  Patient continues to receive TPN at 60 ml/hr (providing 1454 kcal and 72g protein). TPN to be advanced to goal rate today, 85 ml/hr (provides 2060 kcal, 102g protein). Pt is now allowed clears from floor.  Will continue to monitor for plan.  No new weight measured since 2/13.   Medications reviewed.  Labs reviewed: CBGs: 77-156 Low Na Mg/Phos WNL  Diet Order:   Diet Order            Diet clear liquid Room service appropriate? No; Fluid consistency: Thin  Diet effective now              EDUCATION NEEDS:   Not appropriate for education at this time  Skin:  Skin Assessment: Skin Integrity Issues: Skin Integrity Issues:: Stage II Stage II: coccyx  Last BM:  2/18  Height:   Ht Readings from Last 1 Encounters:  11/04/18 5\' 11"  (1.803 m)    Weight:   Wt Readings from Last 1 Encounters:  11/04/18 56.2 kg    Ideal Body Weight:  78 kg  BMI:  Body mass index is 17.29 kg/m.  Estimated Nutritional Needs:   Kcal:  2000-2200 kcal  Protein:  100-110 grams  Fluid:  >/= 2L/day  Clayton Bibles, MS, RD, LDN [facility] Inpatient Clinical Dietitian Pager: [phone] After Hours Pager: [phone]

## 2018-11-10 ENCOUNTER — Inpatient Hospital Stay (HOSPITAL_COMMUNITY): Payer: Medicaid Other

## 2018-11-10 ENCOUNTER — Encounter (HOSPITAL_COMMUNITY): Payer: Self-pay | Admitting: Surgery

## 2018-11-10 DIAGNOSIS — K625 Hemorrhage of anus and rectum: Secondary | ICD-10-CM

## 2018-11-10 DIAGNOSIS — K921 Melena: Secondary | ICD-10-CM | POA: Diagnosis not present

## 2018-11-10 LAB — CBC
HCT: 26.5 % — ABNORMAL LOW (ref 39.0–52.0)
HEMOGLOBIN: 8.6 g/dL — AB (ref 13.0–17.0)
MCH: 29.1 pg (ref 26.0–34.0)
MCHC: 32.5 g/dL (ref 30.0–36.0)
MCV: 89.5 fL (ref 80.0–100.0)
Platelets: 322 10*3/uL (ref 150–400)
RBC: 2.96 MIL/uL — AB (ref 4.22–5.81)
RDW: 15.1 % (ref 11.5–15.5)
WBC: 8.4 10*3/uL (ref 4.0–10.5)
nRBC: 0 % (ref 0.0–0.2)

## 2018-11-10 LAB — COMPREHENSIVE METABOLIC PANEL
ALT: 45 U/L — AB (ref 0–44)
AST: 50 U/L — AB (ref 15–41)
Albumin: 1.8 g/dL — ABNORMAL LOW (ref 3.5–5.0)
Alkaline Phosphatase: 73 U/L (ref 38–126)
Anion gap: 7 (ref 5–15)
BUN: 16 mg/dL (ref 6–20)
CO2: 26 mmol/L (ref 22–32)
CREATININE: 0.47 mg/dL — AB (ref 0.61–1.24)
Calcium: 8 mg/dL — ABNORMAL LOW (ref 8.9–10.3)
Chloride: 99 mmol/L (ref 98–111)
GFR calc Af Amer: >60 mL/min (ref >60)
GFR calc non Af Amer: >60 mL/min (ref >60)
Glucose, Bld: 152 mg/dL — ABNORMAL HIGH (ref 70–99)
Potassium: 4.5 mmol/L (ref 3.5–5.1)
Sodium: 132 mmol/L — ABNORMAL LOW (ref 135–145)
Total Bilirubin: 0.2 mg/dL — ABNORMAL LOW (ref 0.3–1.2)
Total Protein: 6.3 g/dL — ABNORMAL LOW (ref 6.5–8.1)

## 2018-11-10 LAB — GLUCOSE, CAPILLARY
Glucose-Capillary: 123 mg/dL — ABNORMAL HIGH (ref 70–99)
Glucose-Capillary: 127 mg/dL — ABNORMAL HIGH (ref 70–99)
Glucose-Capillary: 130 mg/dL — ABNORMAL HIGH (ref 70–99)
Glucose-Capillary: 187 mg/dL — ABNORMAL HIGH (ref 70–99)
Glucose-Capillary: 233 mg/dL — ABNORMAL HIGH (ref 70–99)
Glucose-Capillary: 58 mg/dL — ABNORMAL LOW (ref 70–99)
Glucose-Capillary: 93 mg/dL (ref 70–99)

## 2018-11-10 LAB — MAGNESIUM: Magnesium: 2 mg/dL (ref 1.7–2.4)

## 2018-11-10 LAB — HEPARIN LEVEL (UNFRACTIONATED): HEPARIN UNFRACTIONATED: 0.66 [IU]/mL (ref 0.30–0.70)

## 2018-11-10 LAB — PHOSPHORUS: Phosphorus: 4.1 mg/dL (ref 2.5–4.6)

## 2018-11-10 MED ORDER — TRAVASOL 10 % IV SOLN
INTRAVENOUS | Status: AC
  Administered 2018-11-10: 18:00:00 via INTRAVENOUS
  Filled 2018-11-10: qty 1020

## 2018-11-10 MED ORDER — IOPAMIDOL (ISOVUE-370) INJECTION 76%
INTRAVENOUS | Status: AC
  Filled 2018-11-10: qty 100

## 2018-11-10 MED ORDER — IOPAMIDOL (ISOVUE-370) INJECTION 76%
100.0000 mL | Freq: Once | INTRAVENOUS | Status: AC | PRN
  Administered 2018-11-10: 100 mL via INTRAVENOUS

## 2018-11-10 MED ORDER — HEPARIN (PORCINE) 25000 UT/250ML-% IV SOLN
1600.0000 [IU]/h | INTRAVENOUS | Status: DC
  Administered 2018-11-10: 1700 [IU]/h via INTRAVENOUS
  Filled 2018-11-10 (×2): qty 250

## 2018-11-10 MED ORDER — SODIUM CHLORIDE 0.9 % IV SOLN
INTRAVENOUS | Status: DC | PRN
  Administered 2018-11-10: 1000 mL via INTRAVENOUS
  Administered 2018-11-15: 500 mL via INTRAVENOUS

## 2018-11-10 MED ORDER — SODIUM CHLORIDE (PF) 0.9 % IJ SOLN
INTRAMUSCULAR | Status: AC
  Filled 2018-11-10: qty 50

## 2018-11-10 NOTE — Progress Notes (Signed)
[place] for Heparin  Indication: PE, DVT  Allergies  Allergen Reactions  . Lisinopril Anaphylaxis   Patient Measurements: Height: 5\' 11"  (180.3 cm) Weight: 124 lb (56.2 kg) IBW/kg (Calculated) : 75.3 Heparin Dosing Weight: 66.7  Vital Signs: Temp: 98.3 F (36.8 C) (02/19 1429) Temp Source: Oral (02/19 1429) BP: 129/94 (02/19 1448) Pulse Rate: 98 (02/19 1448)  Labs: Recent Labs    11/08/18 0455 11/09/18 0338 11/10/18 0559 11/10/18 1514  HGB 7.8* 7.0* 8.6*  --   HCT 25.2* 22.5* 26.5*  --   PLT 297 303 322  --   HEPARINUNFRC  --   --   --  0.66  CREATININE 0.49* 0.42* 0.47*  --    Estimated Creatinine Clearance: 81 mL/min (A) (by C-G formula based on SCr of 0.47 mg/dL (L)).  Assessment: 58 yo male with arterial embolus s/p embolectomy. Patient had development of hematoma with compartment syndrome as well as hemorrhagic shock secondary to a bleeding duodenal ulcer.   Events this admission on 09/03/18:  LLE DVT/ischemia s/p urgent left popliteal artery embolectomy 12/13 >> RLE hematoma on 12/15 am s/p evacuation in OR.Heparin stopped 2/2 drop in hgb, hematemesis. -Heparin cautiously started 12/22,had few episodes of hematemesis & nose bleed 12/24> stopped heparin per dr. Candiss Norse S/p EUS 12/26:  pancreatic mass eroding into duodenum -VVS notes ikely too high risk for [facility]  12/28- Dr. Candiss Norse noted Continue PPI and Hold Heparin indefinitely  2/5: pharmacy consulted to dose heparin with no bolus by Dr Marin Olp for acute PE. Previously on 12/24 his heparin level was therapeutic at 0.35 on 1200 units/hr. 2/7: Heparin off at 09 am for EDG, heparin to resume after NG placed per GI instructions. KUB 1629> NG in stomach 2/13: Expl lap & Gastrojejunostomy- not able to use jejunostomy for enteral feeds. Heparin resumed at 2000 at rate of 1600 units/hr 2/14 heparin tx to lovenox (heparin at 1700 units/hr) 2/19 lovenox to heparin in anticipation of  colonoscopy  Today, 11/10/2018 - H/H low, Hgb 8.6, PLT stable - rectal bleeding noted - Heparin resumed today at 1700 units/hr  Goal of Therapy:  Monitor platelets by anticoagulation protocol: Yes Heparin level 0.3-0.5 units/ml   Plan:   Reduce Heparin drip to 1600 units/hr,   Recheck Heparin at 12MN tonight  Daily Hep level from 2/21  Daily CBC   Monitor for bleeding  Minda Ditto PharmD Pager [phone] 11/10/2018, 5:00 PM

## 2018-11-10 NOTE — Progress Notes (Signed)
    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  Today, the patient describes that he started seeing bright red blood about 3 times a day and his stools on 11/04/2018, this is quite a lot of bright red blood and is always with a stool.  Denies abdominal pain, palpitations or shortness of breath.  Last stool this morning.  Patient had a regular breakfast.   Objective   Vital signs in last 24 hours: Temp:  [98.1 F (36.7 C)-98.6 F (37 C)] 98.6 F (37 C) (02/19 1610) Pulse Rate:  [53-105] 105 (02/19 0608) Resp:  [18-20] 18 (02/19 9604) BP: (116-135)/(62-99) 121/62 (02/19 5409) SpO2:  [100 %] 100 % (02/19 8119) Last BM Date: 11/07/18 General:   AA male in NAD Heart:  Regular rate and rhythm; no murmurs Lungs: Respirations even and unlabored, lungs CTA bilaterally Abdomen:  Soft, nontender and nondistended. Normal bowel sounds. Extremities:  Without edema. Neurologic:  Alert and oriented,  grossly normal neurologically. Psych:  Cooperative. Normal mood and affect.  Intake/Output from previous day: 02/18 0701 - 02/19 0700 In: 2175.8 [P.O.:580; I.V.:1395.8; IV Piggyback:200] Out: 2345 [Urine:2345] Intake/Output this shift: Total I/O In: 1339.8 [I.V.:1239.8; IV Piggyback:100] Out: 200 [Urine:200]  Lab Results: Recent Labs    11/08/18 0455 11/09/18 0338 11/10/18 0559  WBC 6.2 6.8 8.4  HGB 7.8* 7.0* 8.6*  HCT 25.2* 22.5* 26.5*  PLT 297 303 322   BMET Recent Labs    11/08/18 0455 11/09/18 0338 11/10/18 0559  NA 135 134* 132*  K 4.8 4.1 4.5  CL 100 99 99  CO2 27 29 26   GLUCOSE  159* 128* 152*  BUN 28* 17 16  CREATININE 0.49* 0.42* 0.47*  CALCIUM 8.0* 8.0* 8.0*   LFT Recent Labs    11/10/18 0559  PROT 6.3*  ALBUMIN 1.8*  AST 50*  ALT 45*  ALKPHOS 73  BILITOT 0.2*    Assessment / Plan:   Assessment: 1.  History of gastric outlet obstruction from progressive pancreatic cancer, duodenal ulcer previously causing massive upper GI bleed status post embolization 2.  Bright red blood per rectum: started 6 days ago, concern from oncology for lower gi bleed; question this vs upper bleed given recent interventions  Plan: 1.  Ordered CT Angio for today, if this is negative then would recommend a colonoscopy tomorrow. 2.  Pending results from above patient will be on clears the rest of today and n.p.o. after midnight 3.  Patient will need movi prep today if plans for colonoscopy tomorrow 4.  Please await any further recommendations from Dr. Bryan Lemma later today.    Thank you for your kind consultation, we will continue to follow.   LOS: 68 days   Levin Erp  11/10/2018, 10:19 AM

## 2018-11-10 NOTE — Progress Notes (Signed)
PROGRESS NOTE    Ryan Medina  FAO:[id number] DOB: 06-26-1961 DOA: 09/03/2018 PCP: Patient, No Pcp Per    Brief Narrative: 58 year old with history of peripheral vascular disease, dilated cardiomyopathy, essential hypertension, diabetes mellitus type 2 initially came to the hospital with right lower extremity pain and was found to have ischemic leg requiring embolectomy on 12/13. He was placed on heparin drip subsequently developed right lower extremity hematoma requiring fasciotomy on 12/15. Unfortunately afterwards he decompensated with active GI bleed and was intubated and was moved to the ICU. He required embolization on 12/17. During the course of his hospitalization he is required total of 25 units of blood. Further evaluation showed pancreatic cancer with duodenal ulcer. Transferred to [facility] for radiation treatment to help with bleeding. Hospital course complicated by sepsis from healthcare acquired pneumonia and Pseudomonas UTI requiring treatment with vancomycin and cefepime.Patient found to have chronic PE therefore started on heparin drip per oncology. Repeat endoscopy showed worsening of gastric outlet obstruction. Initially NG tube placed by patient removed it. Right now on clear liquid diet   Assessment & Plan:   Principal Problem:   Popliteal artery embolus (HCC) Active Problems:   HCAP (healthcare-associated pneumonia)   DM (diabetes mellitus), type 2, uncontrolled (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  Patient was transfused a unit packed red blood cells overnight.  Hemoglobin currently at 8.6 from 7.0.  Questionable etiology.  Patient being followed by oncology who anticipating starting patient on chemotherapy and requesting that GI reassess patient for possible colonoscopy for further evaluation prior to initiation of chemotherapy.  Patient has been changed to IV heparin per oncology today.  CT angiogram has been ordered by GI who is following to determine further evaluation via colonoscopy plus or minus upper endoscopy.  Patient to continue on clears tonight and n.p.o. after midnight.  Appreciate GI input and recommendations.  Adenocarcinoma of pancreas Hemorrhagic shock due to GI bleed from duodenal ulcer, stable Gastric outlet obstruction causing dysphagia and abdominal distention -CT scan showed increase in size of the mass with involvement of intra-abdominal arteries, deemed to be inoperable. Status post radiation. Per oncology plans for outpatient radiation and chemotherapy -Status post endoscopy 12/17, GDA embolization 12/17, status post radiation. -Continue PPI. -Endoscopy showed worsening of gastric outlet obstruction/pyloric stenosis secondary to malignancy. Candida esophagitis -S/Plaparoscopy open gastrojejunostomy no pancreatic mass amenable to biopsy -PICC WAS NOT PLACED AS HE HAD A PORT .Continue TPN. -Patient on clear liquids for now in anticipation of  further evaluation of rectal bleeding.   General surgery following.  GI following.  Candida esophagitis Status post full course of Diflucan.   Chronic bilateral pulmonary embolismstarted Lovenox Lovenox discontinued this morning patient placed on heparin due to bright red blood per rectum.  Per oncology.  Anemia of chronic disease Status post multiple blood transfusions25 blood transfusions Had bloody bowel movements over the past few days initially felt secondary to constipation.  Hemoglobin was 7.  Status post 1 unit packed red blood cells.  Hemoglobin currently at 8.6.  Follow H&H.   Hypokalemia/hypomagnesemia -Potassium repleted and at 4.5.  Magnesium at 2.0.   Sepsis secondary to H CAP and Pseudomonas urinary tract infection -Status post full course of IV vancomycin IV cefepime. PICC line pulled out 1/26.  Ischemic right foot with right popliteal and tibial artery embolus -Status post embolectomy. Complicated by compartment syndrome requiring fasciotomy.  Cardiomyopathy, combined systolic and diastolic CHF, chronic -Patient currently euvolemic.  Follow.   Uncontrolled diabetes mellitus type 2 -Hemoglobin A1c 18.2. CBG of 187 this morning.  Continue Lantus and sliding scale insulin.  Follow.   Severe protein calorie malnutrition Continue TPN.  Patient tolerating clears.  Generalized weakness-physical therapy/Occupational Therapy-recommending patient seen by PT/OT who are recommending SNF.    Severe protein calorie malnutrition Patient on TNA.  Patient tolerating clears.    DVT prophylaxis: Heparin Code Status: Full Family Communication: Updated patient, brother and sister at bedside. Disposition Plan: To be determined.  Patient homeless and likely not a suitable candidate for shelter at this time due to ongoing medical issues.   Consultants:   Gastroenterology  Oncology  Conventional radiology  Pulmonary and critical care medicine  Vascular surgery  Palliative care  General  surgery  Cardiology  Procedures:  Laparoscopy, open gastrojejunostomy per Dr. Hassell Done 11/04/2018  Endoscopy 09/07/2018  EUS 09/16/2018  Chemo-port placement 10/12/2018  Repeat endoscopy 10/29/2018--per Dr. Loletha Carrow III  Antimicrobials:   IV vancomycin--finished course  IV cefepime--- finished course   Subjective: Patient with complaints of bright red blood per rectum over the past few days.  Denies any chest pain or shortness of breath.  Denies any abdominal pain.  No nausea or vomiting.  Currently tolerating clears.  Objective: Vitals:   11/09/18 2132 11/10/18 0608 11/10/18 1429 11/10/18 1448  BP: (!) 116/92 121/62 (!) 141/105 (!) 129/94  Pulse: (!) 53 (!) 105 (!) 108 98  Resp: 18 18 16    Temp: 98.3 F (36.8 C) 98.6 F (37 C) 98.3 F (36.8 C)   TempSrc: Oral Oral Oral   SpO2: 100% 100% 100%   Weight:      Height:        Intake/Output Summary (Last 24 hours) at 11/10/2018 1515 Last data filed at 11/10/2018 1428 Gross per 24 hour  Intake 1494.82 ml  Output 2145 ml  Net -650.18 ml   Filed Weights   10/29/18 1219 11/02/18 1806 11/04/18 0831  Weight: 54.7 kg 56.2 kg 56.2 kg    Examination:  General exam: Appears calm and comfortable  Respiratory system: Clear to auscultation. Respiratory effort normal. Cardiovascular system: S1 & S2 heard, RRR. No JVD, murmurs, rubs, gallops or clicks. No pedal edema. Gastrointestinal system: Abdomen is nondistended, soft and nontender. No organomegaly or masses felt. Normal bowel sounds heard.  Mid abdominal surgical wound clean dry and intact.  Staples in place. Central nervous system: Alert and oriented. No focal neurological deficits. Extremities: Symmetric 5 x 5 power. Skin: No rashes, lesions or ulcers Psychiatry: Judgement and insight  appear normal. Mood & affect appropriate.     Data Reviewed: I have personally reviewed following labs and imaging studies  CBC: Recent Labs  Lab 11/06/18 0641 11/08/18 0111  11/08/18 0455 11/09/18 0338 11/10/18 0559  WBC 9.4 9.5 6.2 6.8 8.4  NEUTROABS 8.0*  --  5.0  --   --   HGB 10.6* 8.1* 7.8* 7.0* 8.6*  HCT 33.7* 26.6* 25.2* 22.5* 26.5*  MCV 90.3 93.3 91.6 88.9 89.5  PLT 318 347 297 303 628   Basic Metabolic Panel: Recent Labs  Lab 11/06/18 0641 11/07/18 0608 11/08/18 0455 11/09/18 0338 11/10/18 0559  NA 135 137 135 134* 132*  K 4.2 3.3* 4.8 4.1 4.5  CL 100 99 100 99 99  CO2 27 28 27 29 26   GLUCOSE 191* 144* 159* 128* 152*  BUN 10 9 28* 17 16  CREATININE 0.40* 0.41* 0.49* 0.42* 0.47*  CALCIUM 8.2* 8.1* 8.0* 8.0* 8.0*  MG 1.7 1.8 1.7 1.9 2.0  PHOS 3.7 3.1 2.9 3.9 4.1   GFR: Estimated Creatinine Clearance: 81 mL/min (A) (by C-G formula based on SCr of 0.47 mg/dL (L)). Liver Function Tests: Recent Labs  Lab 11/04/18 0344 11/06/18 0641 11/08/18 0455 11/09/18 0338 11/10/18 0559  AST 15 15 21 23  50*  ALT 17 15 22 24  45*  ALKPHOS 69 59 65 64 73  BILITOT 0.3 0.4 0.3 0.2* 0.2*  PROT 6.6 6.1* 6.2* 6.1* 6.3*  ALBUMIN 1.5* 1.6* 1.6* 1.7* 1.8*   No results for input(s): LIPASE, AMYLASE in the last 168 hours. No results for input(s): AMMONIA in the last 168 hours. Coagulation Profile: No results for input(s): INR, PROTIME in the last 168 hours. Cardiac Enzymes: No results for input(s): CKTOTAL, CKMB, CKMBINDEX, TROPONINI in the last 168 hours. BNP (last 3 results) No results for input(s): PROBNP in the last 8760 hours. HbA1C: No results for input(s): HGBA1C in the last 72 hours. CBG: Recent Labs  Lab 11/09/18 2325 11/10/18 0336 11/10/18 0437 11/10/18 0751 11/10/18 1219  GLUCAP 231* 58* 123* 187* 127*   Lipid Profile: Recent Labs    11/08/18 0455  TRIG 83   Thyroid Function Tests: No results for input(s): TSH, T4TOTAL, FREET4, T3FREE, THYROIDAB in the last 72 hours. Anemia Panel: No results for input(s): VITAMINB12, FOLATE, FERRITIN, TIBC, IRON, RETICCTPCT in the last 72 hours. Sepsis Labs: No results for input(s):  PROCALCITON, LATICACIDVEN in the last 168 hours.  No results found for this or any previous visit (from the past 240 hour(s)).       Radiology Studies: No results found.      Scheduled Meds: . heparin lock flush  500 Units Intracatheter Q30 days  . insulin aspart  0-20 Units Subcutaneous Q4H  . pantoprazole (PROTONIX) IV  40 mg Intravenous Q12H   Continuous Infusions: . sodium chloride 1,000 mL (11/10/18 1339)  . heparin 1,700 Units/hr (11/10/18 1013)  . methocarbamol (ROBAXIN) IV 500 mg (11/10/18 1339)  . TPN ADULT (ION) 85 mL/hr at 11/10/18 0800  . TPN ADULT (ION)       LOS: 68 days    Time spent: 40 minutes    Irine Seal, MD [facility]  If 7PM-7AM, please contact night-coverage [email or web address] 11/10/2018, 3:15 PM

## 2018-11-10 NOTE — Progress Notes (Signed)
[place] for Heparin  Indication: PE, DVT  Allergies  Allergen Reactions  . Lisinopril Anaphylaxis   Patient Measurements: Height: 5\' 11"  (180.3 cm) Weight: 124 lb (56.2 kg) IBW/kg (Calculated) : 75.3 Heparin Dosing Weight: 66.7  Vital Signs: Temp: 98.6 F (37 C) (02/19 0608) Temp Source: Oral (02/19 0608) BP: 121/62 (02/19 9735) Pulse Rate: 105 (02/19 0608)  Labs: Recent Labs    11/08/18 0455 11/09/18 0338 11/10/18 0559  HGB 7.8* 7.0* 8.6*  HCT 25.2* 22.5* 26.5*  PLT 297 303 322  CREATININE 0.49* 0.42* 0.47*   Estimated Creatinine Clearance: 81 mL/min (A) (by C-G formula based on SCr of 0.47 mg/dL (L)).  Assessment: 58 yo male with arterial embolus s/p embolectomy. Patient had development of hematoma with compartment syndrome as well as hemorrhagic shock secondary to a bleeding duodenal ulcer.   Events this admission on 09/03/18:  LLE DVT/ischemia s/p urgent left popliteal artery embolectomy 12/13 >> RLE hematoma on 12/15 am s/p evacuation in OR.Heparin stopped 2/2 drop in hgb, hematemesis. -Heparin cautiously started 12/22,had few episodes of hematemesis & nose bleed 12/24> stopped heparin per dr. Candiss Norse S/p EUS 12/26:  pancreatic mass eroding into duodenum -VVS notes ikely too high risk for [facility]  12/28- Dr. Candiss Norse noted Continue PPI and Hold Heparin indefinitely  2/5: pharmacy consulted to dose heparin with no bolus by Dr Marin Olp for acute PE. Previously on 12/24 his heparin level was therapeutic at 0.35 on 1200 units/hr. 2/7: Heparin off at 09 am for EDG, heparin to resume after NG placed per GI instructions. KUB 1629> NG in stomach 2/13: Expl lap & Gastrojejunostomy- not able to use jejunostomy for enteral feeds. Heparin resumed at 2000 at rate of 1600 units/hr 2/14 heparin tx to lovenox (heparin at 1700 units/hr) 2/19 lovenox to heparin in anticipation of colonoscopy  Today, 11/10/2018 - H/H low, Hgb 8.6 - PLT stable - rectal  bleeding  Goal of Therapy:  Monitor platelets by anticoagulation protocol: Yes Heparin level 0.3-0.5 units/ml   Plan:   Start heparin drip at 1700 units/hr, no bolus  Heparin level in 6 hours  Daily CBC   Monitor for bleeding  Dolly Rias RPh 11/10/2018, 8:43 AM Pager [phone]

## 2018-11-10 NOTE — Progress Notes (Signed)
Physical Therapy Treatment Patient Details Name: Ryan Medina Staff MRN: [id number] DOB: 06-21-1961 Today's Date: 11/10/2018    History of Present Illness 58 y.o. male with PMH including but not limited to DM and HTN who presented to the hospital on 12/13 with a right ischemic leg secondary to acute popliteal and tibial embolus, underwent embolectomy on 12/13 and subsequently was placed on a heparin drip.  He unfortunately developed a acute right lower leg hematoma with compartment syndrome requiring fasciotomy on 12/15.  Further hospital course was complicated by development of hemorrhagic shock with acute blood loss anemia secondary to a bleeding duodenal ulcer-requiring ICU transfer-intubation for airway protection and IR evaluation with mesenteric angiogram and GDA embolization.  Upon stability he was [facility]. Pt is now s/p closure of R LE fasciotomy on 09/10/18. 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  Plans to transfer to [facility] for radiation treatment.     PT Comments    Pt agreeable to ambulate hallway distance today with RW use. Pt still refusing to WB on RLE due to R foot and leg pain. Pt performed LE exercises well, motivated to progress mobility. PT to continue to follow acutely.   Follow Up Recommendations  SNF     Equipment Recommendations  Rolling walker with 5" wheels;3in1 (PT);Wheelchair (measurements PT);Wheelchair cushion (measurements PT)    Recommendations for Other Services       Precautions / Restrictions Precautions Precautions: Fall Precaution Comments: abdominal - pt with abdominal surgery 11/04/18  Required Braces or Orthoses: Other Brace Other Brace: CAM boot (doesn't like to wear) Restrictions Weight Bearing Restrictions: No RLE Weight Bearing: Weight bearing as tolerated    Mobility  Bed Mobility Overal bed mobility: Needs Assistance Bed Mobility: Supine to Sit      Supine to sit: Supervision     General bed mobility comments: Supervision for safety, increased time and effort. Abdominal pain upon initial sitting at EOB.   Transfers Overall transfer level: Needs assistance Equipment used: Rolling walker (2 wheeled) Transfers: Sit to/from Stand Sit to Stand: Min guard;From elevated surface         General transfer comment: Min guard for safety. Increased time to come to full standing, due to abdominal pain with moving from hip flexion to extension.   Ambulation/Gait Ambulation/Gait assistance: Min guard;+2 safety/equipment(chair follow) Gait Distance (Feet): 20 Feet Assistive device: Rolling walker (2 wheeled) Gait Pattern/deviations: Step-to pattern;Antalgic;Trunk flexed Gait velocity:  decreased   General Gait Details: Pt with hop-to gait, as pt refuses WB on RLE due to pain. Multiple verbal reinforcements to walk in neutral hip extension, pt reluctant to do this due to abdominal pain.    Stairs             Wheelchair Mobility    Modified Rankin (Stroke Patients Only)       Balance Overall balance assessment: Needs assistance Sitting-balance support: No upper extremity supported Sitting balance-Leahy Scale: Good     Standing balance support: Bilateral upper extremity supported Standing balance-Leahy Scale: Poor Standing balance comment: relies on RW                             Cognition Arousal/Alertness: Awake/alert Behavior During Therapy: WFL for tasks assessed/performed Overall Cognitive Status: Within Functional Limits for tasks assessed  General Comments: family at bedside, pt slightly irritated at first but willing to mobilize once sitting EOB.       Exercises General Exercises - Lower Extremity Ankle Circles/Pumps: AROM;20 reps;Both;Seated Quad Sets: AROM;Both;Supine;20 reps Gluteal Sets: AROM;Both;Seated;20 reps Hip ABduction/ADduction:  Supine;AAROM;Both;15 reps Straight Leg Raises: 10 reps;Supine;Both;AAROM    General Comments General comments (skin integrity, edema, etc.): Pt requesting L side of RW be one level lower than R side of RW. PT reluctant to do this, but pt states it is easier for him to mobilize. Pt with no unsteadiness present during ambulation.       Pertinent Vitals/Pain Pain Assessment: 0-10 Pain Score: 7  Pain Location: abdomen  Pain Descriptors / Indicators: Sore;Discomfort Pain Intervention(s): Limited activity within patient's tolerance;Repositioned;Monitored during session;Premedicated before session    Home Living                      Prior Function            PT Goals (current goals can now be found in the care plan section) Acute Rehab PT Goals Patient Stated Goal: decrease leg pain with mobility PT Goal Formulation: With patient/family Time For Goal Achievement: 11/19/18 Potential to Achieve Goals: Fair Progress towards PT goals: Progressing toward goals    Frequency    Min 2X/week      PT Plan Current plan remains appropriate    Co-evaluation              AM-PAC PT "6 Clicks" Mobility   Outcome Measure  Help needed turning from your back to your side while in a flat bed without using bedrails?: A Little Help needed moving from lying on your back to sitting on the side of a flat bed without using bedrails?: A Little Help needed moving to and from a bed to a chair (including a wheelchair)?: A Little Help needed standing up from a chair using your arms (e.g., wheelchair or bedside chair)?: A Little Help needed to walk in hospital room?: A Little Help needed climbing 3-5 steps with a railing? : A Lot 6 Click Score: 17    End of Session Equipment Utilized During Treatment: (Pt defers gait belt ) Activity Tolerance: Patient limited by pain Patient left: in chair;with call bell/phone within reach;with chair alarm set;with family/visitor present Nurse  Communication: Mobility status PT Visit Diagnosis: Other abnormalities of gait and mobility (R26.89);Muscle weakness (generalized) (M62.81) Pain - Right/Left: Right Pain - part of body: Ankle and joints of foot(abdomen )     Time: 1210-1228 PT Time Calculation (min) (ACUTE ONLY): 18 min  Charges:  $Gait Training: 8-22 mins                     Julien Girt, PT Acute Rehabilitation Services Pager [phone]  Office [phone]  [place] 11/10/2018, 1:31 PM

## 2018-11-10 NOTE — Progress Notes (Signed)
6 Days Post-Op    CC:  Nonresectable pancreatic cancer with gastric outlet syndrome  Subjective: Bright red blood from his rectum noted in the toilet.  Lovenox is been placed on hold Dr. Marin Olp is recommended GI evaluation and colonoscopy.  He did well with the liquids yesterday.  I am going to keep him on clear liquids and let them order what ever he wants today.  Objective: Vital signs in last 24 hours: Temp:  [98.1 F (36.7 C)-98.6 F (37 C)] 98.6 F (37 C) (02/19 4098) Pulse Rate:  [53-105] 105 (02/19 0608) Resp:  [18-20] 18 (02/19 0608) BP: (116-135)/(62-99) 121/62 (02/19 0608) SpO2:  [100 %] 100 % (02/19 0608) Last BM Date: 11/07/18 580 p.o. 1395 IV 2345 urine BM x3 Afebrile vital signs are stable Transfused 1 unit yesterday HBG 7.0>>8.6 today  Intake/Output from previous day: 02/18 0701 - 02/19 0700 In: 2175.8 [P.O.:580; I.V.:1395.8; IV Piggyback:200] Out: 2345 [Urine:2345] Intake/Output this shift: Total I/O In: 1339.8 [I.V.:1239.8; IV Piggyback:100] Out: 200 [Urine:200]  General appearance: alert, cooperative and no distress Resp: clear to auscultation bilaterally GI: Soft, sore, she is not distended.  Light is tender but is having stools and tolerating the limited clear liquids well.  Lab Results:  Recent Labs    11/09/18 0338 11/10/18 0559  WBC 6.8 8.4  HGB 7.0* 8.6*  HCT 22.5* 26.5*  PLT 303 322    BMET Recent Labs    11/09/18 0338 11/10/18 0559  NA 134* 132*  K 4.1 4.5  CL 99 99  CO2 29 26  GLUCOSE 128* 152*  BUN 17 16  CREATININE 0.42* 0.47*  CALCIUM 8.0* 8.0*   PT/INR No results for input(s): LABPROT, INR in the last 72 hours.  Recent Labs  Lab 11/04/18 0344 11/06/18 0641 11/08/18 0455 11/09/18 0338 11/10/18 0559  AST 15 15 21 23  50*  ALT 17 15 22 24  45*  ALKPHOS 69 59 65 64 73  BILITOT 0.3 0.4 0.3 0.2* 0.2*  PROT 6.6 6.1* 6.2* 6.1* 6.3*  ALBUMIN 1.5* 1.6* 1.6* 1.7* 1.8*     Lipase     Component Value Date/Time    LIPASE 42 08/22/2018 1932     Medications: . heparin lock flush  500 Units Intracatheter Q30 days  . insulin aspart  0-20 Units Subcutaneous Q4H  . pantoprazole (PROTONIX) IV  40 mg Intravenous Q12H   . heparin    . methocarbamol (ROBAXIN) IV 500 mg (11/10/18 0550)  . 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 Chronic pain/hx of substance abuse -ETOH/Cocaine/tobacco Anemia H/H: 7.8/25.2>>7.0/22.5;  being transfused 11/09/18 Hx exploratory laparotomy,closure of pyloric channel ulcer with Phillip Heal patch 12/2003 Dr. 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 Johnathan Hausen POD# 6  FEN:IV fluids/TPN/ sips of clears >> clears from the floor ID: Diflucan 2/7>>2/12 (candida esophagitis) cefotetan preop 11/04/2018 DVT: SCD/Lovenox Follow up: TBD   Plan: We will leave  him on clear liquids, but let him order from the floor.  If he is going to need a colonoscopy there is no need to advance his diet before his bowel prep.  From our standpoint he is doing quite well.  LOS: 68 days     Ryan Medina 11/10/2018 [phone]

## 2018-11-10 NOTE — Progress Notes (Signed)
PHARMACY - ADULT TOTAL PARENTERAL NUTRITION CONSULT NOTE   Pharmacy Consult for TPN  Indication: gastric outlet obstruction 2/2 advanced pancreatic adenocarcinoma s/p laparoscopy, open gastrojejunostomy on 2/13  Patient Measurements: Height: 5' 11"  (180.3 cm) Weight: 124 lb (56.2 kg) IBW/kg (Calculated) : 75.3 TPN AdjBW (KG): 56.2 Body mass index is 17.29 kg/m.   Insulin Requirements: 14 units SSI in past 24 hours + 10 units regular insulin in TPN (levemir 4 units daily d/c)  Current Nutrition: TPN; clear liquid diet   IVF: d/c 2/16  Central access: port TPN start date: 11/05/2018  ASSESSMENT                                                                                                          HPI: 80 y/oM with RLE pain/numbness, DVT and limb ischemia > urgent arteriography & popliteal embolectomy on 12/13 > placed on heparin drip, developed RLE hematoma requiring fasciotomy 12/15 > hemorrhagic shock due to GI bleed, AC stopped > heparin resumed 2/5 for PE > change to lovenox 2/14 - GOO secondary to pancreatic Ca, s/p laparoscopy, open gastrojejunostomy on 2/13, pt removed NGT in PACU -pharmacy consulted to start TPN 2/14  Significant events:   2/15: try clear liquids per CCS  2/16: advance to full liquid diet per MD  2/17: increased abdominal pain and distension overnight  2/19: rectal bleeding  Today: 11/10/18    No po intake > 24h per chart  Glucose - CBGs 58-231 in past 24 hours (goal < 150)  Electrolytes -  Na low,  phos and KCl increasing, all others WNL   Renal - SCr stable  LFTs - Albumin low; AST/ALT, Alk Phos, Tbili WNL (2/18)  TGs - 83 (2/17)  Prealbumin - 5.1 (2/11), 5.6 (2/15), 10.8 (2/17)  NUTRITIONAL GOALS                                                                                             RD recs (2/14):  Kcal:  2000-2200 kcal Protein:  100-110 grams  Custom TPN at goal rate of 68m/hr provides: - 102 g/day protein (50 g/L) - 61.2  g/day Lipid  (30 g/L) - 306 g/day Dextrose (15 %) -  2060 Kcal/day   PLAN  At 1800 today:  continue TPN at goal rate of 85 ml/hr. -Provides2060 kCals, 102g proteinper day,meeting100% of goal kcal and goal AA  Electrolytes in TPN:  increase Na+ to 75 mEq/L, continue Mg at 10 mEq/L,decrease K  to 60 mEq/L, decrease phos to 15 mmol/L, continue standard Ca and Phos, Cl:Ac ratio 1:1  TPN to contain standard multivitamins and trace elements.  Continue SSI resistant scale q4h. Continue 10 units regular insulin in TPN - not increasing insulin in bag yet despite advancing to goal due to CBG of 58  TPN lab panels on Mondays & Thursdays.  F/u daily.  Dolly Rias RPh 11/10/2018, 9:39 AM Pager [phone]

## 2018-11-10 NOTE — Progress Notes (Signed)
Patient ID: Ryan Medina, male   DOB: 01-06-1961, 58 y.o.   MRN: [id number]  This NP visited patient at the bedside as a follow up for palliative medicine needs and emotional support.  [name] is alert and oriented, is s/p laparoscopy with open gastrojejunostomy 11-04-18 and tolerating clear liquid diet no nausea or vomiting. Patient reports that his pain is controlled with current medication regime, no complaints voiced.  Patient has multiple comorbidities: Malnutrition, ischemic right lower extremity which are compounded by dense psychosocial issues ( homeless  and minimal social support)  Patient declines offer to coordinate family meeting with his sister and brother who live local.    Patient verbalizes an understanding of the seriousness of his disease but remains hopeful for improvement.     Created space and opportunity for [name] to continue to explore his thoughts and feelings about his current medical situation.  He tells me he is open to "whatever they suggest".    His goal is continued quality of life.    Emotional support offered        Questions and concerns addressed      Palliative medicine team will continue to support holistically.  Total time spent on the unit was 20   minutes  Greater than 50% of the time was spent in counseling and coordination of care  Wadie Lessen NP  Palliative Medicine Team Team Phone # [phone] Pager [phone]

## 2018-11-10 NOTE — Progress Notes (Signed)
It looks like Ryan Medina is having some bright red blood per rectum.  Ryan Medina says this started happening after Ryan Medina surgery.  The nurses aide also confirms that there is bright red blood in the toilet bowl.  Ryan Medina is on anticoagulation.  I think we have to sort out why Ryan Medina is having this bright red blood per rectum.  I had think this is from the lower GI tract.  It sounds like we probably need to get gastroenterology involved again so they can do a colonoscopy on Ryan Medina.  We plan to get Ryan Medina back on heparin for right now.  Ryan Medina is taking clear liquids.  Ryan Medina still getting TNA.  Ryan Medina abdominal wound really looks quite good.  I would think this is indicative of decent nutritional intake.  I think that her pocket have to get some chemotherapy started on Ryan Medina next week.  I think that Ryan Medina abdominal wound is healing up well enough that Ryan Medina could handle systemic chemotherapy.  Ryan Medina has a dressing on Ryan Medina right foot.  Ryan Medina labs today show sodium 132.  Potassium 4.5.  BUN 16 and creatinine 0.45.  Ryan Medina albumin is 1.8.  Ryan Medina liver function test are slightly elevated.  Ryan Medina white cell count 8.4.  Hemoglobin 8.6.  Platelet count 322,000.  Ryan Medina was transfused on the 18th.  Again, I think the drop in Ryan Medina blood count probably is from this lower GI bleeding.  On Ryan Medina exam, Ryan Medina temperature is 98.6.  Pulse 105.  Blood pressure 121/62.  Ryan Medina head neck exam shows no ocular or oral lesions.  Ryan Medina has no thrush in the oral cavity.  There is no scleral icterus.  Ryan Medina has no adenopathy in the neck.  Lungs are clear bilaterally.  Cardiac exam slightly tachycardic but regular.  Abdomen is soft.  Ryan Medina laparotomy scars healing.  Ryan Medina has no fluid wave.  Ryan Medina has no obvious abdominal mass.  Extremities shows no clubbing, cyanosis or edema.  Ryan Medina dressing on the right foot.  Neurological exam is nonfocal.  Ryan Medina has the inoperable locally advanced pancreatic cancer.  Ryan Medina had gastric outlet obstruction.  This was bypassed with a gastrojejunostomy.  Ryan Medina is having  bright red blood per rectum.  Again, I think gastroenterology needs to do a colonoscopy to see where the might be a source of blood.  We have to find this out if we are to treat Ryan Medina with chemotherapy.  Ryan Medina we switched back over to heparin.  I know that the staff on [name] is doing a great job with Ryan Medina.  Ryan Medina has been in the hospital now for over 2 months.  I just feel bad that there is no place that Ryan Medina can go to.   Ryan Haw, MD  [place] 12:12

## 2018-11-11 LAB — BASIC METABOLIC PANEL
ANION GAP: 5 (ref 5–15)
BUN: 16 mg/dL (ref 6–20)
CO2: 29 mmol/L (ref 22–32)
Calcium: 8.1 mg/dL — ABNORMAL LOW (ref 8.9–10.3)
Chloride: 98 mmol/L (ref 98–111)
Creatinine, Ser: 0.52 mg/dL — ABNORMAL LOW (ref 0.61–1.24)
GFR calc Af Amer: >60 mL/min (ref >60)
GFR calc non Af Amer: >60 mL/min (ref >60)
Glucose, Bld: 243 mg/dL — ABNORMAL HIGH (ref 70–99)
Potassium: 4.5 mmol/L (ref 3.5–5.1)
Sodium: 132 mmol/L — ABNORMAL LOW (ref 135–145)

## 2018-11-11 LAB — HEPARIN LEVEL (UNFRACTIONATED)
HEPARIN UNFRACTIONATED: 0.56 [IU]/mL (ref 0.30–0.70)
Heparin Unfractionated: 0.67 IU/mL (ref 0.30–0.70)
Heparin Unfractionated: 0.77 IU/mL — ABNORMAL HIGH (ref 0.30–0.70)

## 2018-11-11 LAB — GLUCOSE, CAPILLARY
GLUCOSE-CAPILLARY: 143 mg/dL — AB (ref 70–99)
Glucose-Capillary: 121 mg/dL — ABNORMAL HIGH (ref 70–99)
Glucose-Capillary: 135 mg/dL — ABNORMAL HIGH (ref 70–99)
Glucose-Capillary: 145 mg/dL — ABNORMAL HIGH (ref 70–99)
Glucose-Capillary: 223 mg/dL — ABNORMAL HIGH (ref 70–99)
Glucose-Capillary: 137 mg/dL — ABNORMAL HIGH (ref 70–99)

## 2018-11-11 LAB — CBC
HEMATOCRIT: 28.5 % — AB (ref 39.0–52.0)
Hemoglobin: 8.7 g/dL — ABNORMAL LOW (ref 13.0–17.0)
MCH: 28 pg (ref 26.0–34.0)
MCHC: 30.5 g/dL (ref 30.0–36.0)
MCV: 91.6 fL (ref 80.0–100.0)
Platelets: 358 10*3/uL (ref 150–400)
RBC: 3.11 MIL/uL — ABNORMAL LOW (ref 4.22–5.81)
RDW: 15.1 % (ref 11.5–15.5)
WBC: 8.3 10*3/uL (ref 4.0–10.5)
nRBC: 0 % (ref 0.0–0.2)

## 2018-11-11 LAB — PHOSPHORUS: Phosphorus: 3.9 mg/dL (ref 2.5–4.6)

## 2018-11-11 LAB — MAGNESIUM: Magnesium: 2 mg/dL (ref 1.7–2.4)

## 2018-11-11 MED ORDER — HEPARIN (PORCINE) 25000 UT/250ML-% IV SOLN
1400.0000 [IU]/h | INTRAVENOUS | Status: AC
  Administered 2018-11-11: 1400 [IU]/h via INTRAVENOUS
  Administered 2018-11-11: 1500 [IU]/h via INTRAVENOUS
  Filled 2018-11-11 (×2): qty 250

## 2018-11-11 MED ORDER — PEG-KCL-NACL-NASULF-NA ASC-C 100 G PO SOLR
0.5000 | ORAL | Status: DC
  Administered 2018-11-11: 100 g via ORAL
  Filled 2018-11-11: qty 1

## 2018-11-11 MED ORDER — ENSURE ENLIVE PO LIQD
237.0000 mL | Freq: Four times a day (QID) | ORAL | Status: DC
  Administered 2018-11-11 – 2018-11-16 (×14): 237 mL via ORAL

## 2018-11-11 MED ORDER — TRAVASOL 10 % IV SOLN
INTRAVENOUS | Status: AC
  Administered 2018-11-11: 18:00:00 via INTRAVENOUS
  Filled 2018-11-11: qty 1020

## 2018-11-11 NOTE — H&P (View-Only) (Signed)
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  Denies any new complaints or concerns but he is ready to eat more of a regular diet.   Objective   Vital signs in last 24 hours: Temp:  [98.2 F (36.8 C)-98.4 F (36.9 C)] 98.2 F (36.8 C) (02/20 0634) Pulse Rate:  [83-109] 83 (02/20 0634) Resp:  [16-18] 17 (02/20 0634) BP: (113-141)/(84-105) 119/84 (02/20 0634) SpO2:  [100 %] 100 % (02/20 0634) Last BM Date: 11/09/18 General:    Cachectic AA male in NAD Heart:  Regular rate and rhythm; no murmurs Lungs: Respirations even and unlabored, lungs CTA bilaterally Abdomen:  Soft, nontender and nondistended. Normal bowel sounds. Extremities:  Without edema. Neurologic:  Alert and oriented,  grossly normal neurologically. Psych:  Cooperative. Normal mood and affect.  Intake/Output from previous day: 02/19 0701 - 02/20 0700 In: 3499.8 [P.O.:100; I.V.:3199.8; IV Piggyback:200] Out: 9937 [Urine:3325]  Lab Results: Recent Labs    11/09/18 0338 11/10/18 0559 11/11/18 0010  WBC 6.8 8.4 8.3  HGB 7.0* 8.6* 8.7*  HCT 22.5* 26.5* 28.5*  PLT 303 322 358   BMET Recent Labs    11/09/18 0338 11/10/18 0559 11/11/18 0010  NA 134* 132* 132*  K 4.1 4.5 4.5  CL 99 99 98  CO2 29 26 29   GLUCOSE 128* 152* 243*  BUN 17 16 16   CREATININE 0.42* 0.47* 0.52*  CALCIUM 8.0* 8.0* 8.1*   LFT Recent Labs    11/10/18 0559  PROT 6.3*  ALBUMIN 1.8*  AST 50*  ALT 45*  ALKPHOS 73  BILITOT 0.2*   Studies/Results: Ct Angio Abd/pel W/ And/or W/o  Result Date: 11/10/2018 CLINICAL DATA:   Pancreatic carcinoma with partial duodenal obstruction, gastric varices. GI bleed since gastrojejunostomy on 11/04/18^ EXAM: CTA ABDOMEN AND PELVIS wITHOUT AND WITH CONTRAST TECHNIQUE: Multidetector CT imaging of the abdomen and pelvis was performed using the standard protocol during bolus administration of intravenous contrast. Multiplanar reconstructed images and MIPs were obtained and reviewed to evaluate the vascular anatomy. CONTRAST:  153mL ISOVUE-370 IOPAMIDOL (ISOVUE-370) INJECTION 76% COMPARISON:  10/27/2018 FINDINGS: VASCULAR Aorta: Fusiform abdominal aortic aneurysm measured up to 3.1 cm maximum diameter, stable. No significant intraluminal thrombus. No dissection or stenosis. Celiac: Patent. The left gastric artery arises directly from the abdominal aorta, an anatomic variant. Metallic gastroduodenal embolization coils without evidence of recanalization. SMA: Patent without evidence of aneurysm, dissection, vasculitis or significant stenosis. Renals: Both renal arteries are patent without evidence of aneurysm, dissection, vasculitis, fibromuscular dysplasia or significant stenosis. IMA: Short-segment origin stenosis. Distally patent without evidence of aneurysm, dissection, vasculitis or significant stenosis. Inflow: Fusiform dilatation of the right common iliac artery up to 2.9 cm diameter. Fusiform dilatation of left common iliac artery up to 2.7 cm diameter with some eccentric nonocclusive mural thrombus. Tortuous ectatic distal iliac arterial systems without significant stenosis. Proximal Outflow: Bilateral common femoral and visualized portions of the superficial and profunda femoral arteries are patent without evidence of aneurysm, dissection, vasculitis or significant stenosis. Veins: Apparent splenic venous occlusion with collaterals from the splenic hilum extending along the anterior mesentery. Streak  artifact from GDA embolization coils obscures visualization of the portosplenic confluence. Main  portal vein and intrahepatic branches are patent. Hepatic veins patent. Review of the MIP images confirms the above findings. NON-VASCULAR Lower chest: Chronic atelectasis/scarring in the left lower lobe. No pleural or pericardial effusion. Hepatobiliary: No focal liver abnormality is seen. No gallstones, gallbladder wall thickening, or biliary dilatation. Pancreas: Decreased enhancement in the mid pancreatic body. Atrophy in the pancreatic tail. Some image degradation through the pancreatic head secondary to regional embolization coils. Spleen: Normal in size without focal lesion. Adrenals/Urinary Tract: Adrenal glands are unremarkable. Kidneys are normal, without renal calculi, focal lesion, or hydronephrosis. Bladder is unremarkable. Stomach/Bowel: Stomach is nondilated. Gastrojejunostomy staple line in the lateral gastric body. No evidence of active extravasation. Small bowel is decompressed. Moderate proximal colonic and rectal fecal material without dilatation. Lymphatic: No definite abdominal or pelvic adenopathy, evaluation limited secondary to paucity of fat and diffuse anasarca. Reproductive: Prostate is unremarkable. Other: No significant ascites. Scattered bubbles of free intraperitoneal air presumably postoperative. Musculoskeletal: Midline anterior abdominal wall skin staples. Lumbosacral spondylitic change. No fracture or worrisome bone lesion. IMPRESSION: VASCULAR 1. No evidence of active extravasation. 2. 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 3. 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 NON-VASCULAR 1. No acute findings. 2. Decreased enhancement in the mid pancreatic body suggesting residual/recurrent neoplasm. Electronically Signed   By: Lucrezia Europe M.D.   On: 11/10/2018 16:46     Assessment / Plan:   Assessment: 1.  History of gastric outlet obstruction from progressive pancreatic  cancer, duodenal ulcer previously causing massive upper GI bleed status post embolization 2.  Bright red blood per rectum: brbpr 2-3 times a day since surgery on 11/04/18, hgb 7.0 yesterday--1 unit prbcs-->8.7 today; Consider lower GI bleed vs upper gi bleed given recent   Plan: 1.  CT angio was unrevealing yesterday.  We will plan for EGD and colonoscopy tomorrow morning, 11/12/2026 830 with Dr. Hilarie Fredrickson.  Did discuss risks, benefits, limitations and alternatives and the patient agrees to proceed. 2.  Patient will be on a clear liquid diet today and n.p.o. after midnight 3.  Order movi prep to be started at 3 with split dose 4.  Did consult with pharmacy, they are planning on holding heparin infusion starting at 2:30 tomorrow morning which will be 6 hours before time of planned procedure. 5.  Continue other supportive measures 6.  Please await any further recommendations from Dr. Fuller Plan later today  Thank you for kind consultation, we will continue to follow.   LOS: 69 days   Levin Erp  11/11/2018, 9:32 AM      Attending Physician Note   I have taken an interval history, reviewed the chart and examined the patient. I agree with the Advanced Practitioner's note, impression and recommendations.   Hematochezia has resolved however etiology not established on CTA. R/O ulcer, gastric varices, diverticular, etc. Colonoscopy/egd tomorrow ABL on chronic anemia. Maintain Hgb > 7 Pancreatic adenocarcinoma with GOO S/P gastrojejunostomy, duodenal ulcer S/P embolization for bleed   Lucio Edward, MD [facility] [phone]

## 2018-11-11 NOTE — Progress Notes (Signed)
[place] for Heparin  Indication: PE, DVT  Allergies  Allergen Reactions  . Lisinopril Anaphylaxis   Patient Measurements: Height: 5\' 11"  (180.3 cm) Weight: 124 lb (56.2 kg) IBW/kg (Calculated) : 75.3 Heparin Dosing Weight: 66.7  Vital Signs: Temp: 98.4 F (36.9 C) (02/19 2112) Temp Source: Oral (02/19 2112) BP: 113/87 (02/19 2112) Pulse Rate: 109 (02/19 2112)  Labs: Recent Labs    11/09/18 0338 11/10/18 0559 11/10/18 1514 11/11/18 0010  HGB 7.0* 8.6*  --  8.7*  HCT 22.5* 26.5*  --  28.5*  PLT 303 322  --  358  HEPARINUNFRC  --   --  0.66 0.67  CREATININE 0.42* 0.47*  --  0.52*   Estimated Creatinine Clearance: 81 mL/min (A) (by C-G formula based on SCr of 0.52 mg/dL (L)).  Assessment: 58 yo male with arterial embolus s/p embolectomy. Patient had development of hematoma with compartment syndrome as well as hemorrhagic shock secondary to a bleeding duodenal ulcer.   Events this admission on 09/03/18:  LLE DVT/ischemia s/p urgent left popliteal artery embolectomy 12/13 >> RLE hematoma on 12/15 am s/p evacuation in OR.Heparin stopped 2/2 drop in hgb, hematemesis. -Heparin cautiously started 12/22,had few episodes of hematemesis & nose bleed 12/24> stopped heparin per dr. Candiss Norse S/p EUS 12/26:  pancreatic mass eroding into duodenum -VVS notes ikely too high risk for [facility]  12/28- Dr. Candiss Norse noted Continue PPI and Hold Heparin indefinitely  2/5: pharmacy consulted to dose heparin with no bolus by Dr Marin Olp for acute PE. Previously on 12/24 his heparin level was therapeutic at 0.35 on 1200 units/hr. 2/7: Heparin off at 09 am for EDG, heparin to resume after NG placed per GI instructions. KUB 1629> NG in stomach 2/13: Expl lap & Gastrojejunostomy- not able to use jejunostomy for enteral feeds. Heparin resumed at 2000 at rate of 1600 units/hr 2/14 heparin tx to lovenox (heparin at 1700 units/hr) 2/19 lovenox to heparin in anticipation of  colonoscopy  2/19 - H/H low, Hgb 8.6, PLT stable - rectal bleeding noted - Heparin resumed today at 1700 units/hr Today, 2/20 -0035 HL = 0.67 slightly above desired goal of 0.3-0.5,   Goal of Therapy:  Monitor platelets by anticoagulation protocol: Yes Heparin level 0.3-0.5 units/ml   Plan:   Reduce Heparin drip to 1500 units/hr,   Recheck HL in 6 hours  Daily Hep level from 2/21  Daily CBC   Monitor for bleeding   Dorrene German 11/11/2018, 1:17 AM

## 2018-11-11 NOTE — Progress Notes (Signed)
PHARMACY - ADULT TOTAL PARENTERAL NUTRITION CONSULT NOTE   Pharmacy Consult for TPN  Indication: gastric outlet obstruction 2/2 advanced pancreatic adenocarcinoma s/p laparoscopy, open gastrojejunostomy on 2/13  Patient Measurements: Height: 5' 11"  (180.3 cm) Weight: 124 lb (56.2 kg) IBW/kg (Calculated) : 75.3 TPN AdjBW (KG): 56.2 Body mass index is 17.29 kg/m.   Insulin Requirements: 20 units SSI in past 24 hours + 10 units regular insulin in TPN (levemir 4 units daily d/c)  Current Nutrition: TPN; clear liquid diet   IVF: d/c 2/16  Central access: port TPN start date: 11/05/2018  ASSESSMENT                                                                                                          HPI: 8 y/oM with RLE pain/numbness, DVT and limb ischemia > urgent arteriography & popliteal embolectomy on 12/13 > placed on heparin drip, developed RLE hematoma requiring fasciotomy 12/15 > hemorrhagic shock due to GI bleed, AC stopped > heparin resumed 2/5 for PE > change to lovenox 2/14 - GOO secondary to pancreatic Ca, s/p laparoscopy, open gastrojejunostomy on 2/13, pt removed NGT in PACU -pharmacy consulted to start TPN 2/14  Significant events:   2/15: try clear liquids per CCS  2/16: advance to full liquid diet per MD  2/17: increased abdominal pain and distension overnight  2/19: rectal bleeding  Today: 11/11/18    No po intake > 24h per chart  Glucose - CBGs 93-243 in past 24 hours (goal < 150)  Electrolytes -  Na low,  all others WNL   Renal - SCr stable  LFTs - Albumin low; AST/ALT, Alk Phos, Tbili WNL (2/18)  TGs - 83 (2/17)  Prealbumin - 5.1 (2/11), 5.6 (2/15), 10.8 (2/17)  NUTRITIONAL GOALS                                                                                             RD recs (2/14):  Kcal:  2000-2200 kcal Protein:  100-110 grams  Custom TPN at goal rate of 58m/hr provides: - 102 g/day protein (50 g/L) - 61.2 g/day Lipid  (30 g/L) -  306 g/day Dextrose (15 %) -  2060 Kcal/day   PLAN  At 1800 today:  continue TPN at goal rate of 85 ml/hr. -Provides2060 kCals, 102g proteinper day,meeting100% of goal kcal and goal AA  Electrolytes in TPN:  cont Na+ at 75 mEq/L, continue Mg at 10 mEq/L,decrease K  to 60 mEq/L, decrease phos to 15 mmol/L, continue standard Ca and Phos, Cl:Ac ratio 1:1  TPN to contain standard multivitamins and trace elements.  Continue SSI resistant scale q4h. Continue 10 units regular insulin in TPN - not increasing insulin in bag yet despite advancing to goal due to CBG of 58 yest  TPN lab panels on Mondays & Thursdays.  BMET with mag and phos tomorrow  F/u daily.  Dolly Rias RPh 11/11/2018, 7:42 AM Pager [phone]

## 2018-11-11 NOTE — Anesthesia Preprocedure Evaluation (Addendum)
Anesthesia Evaluation  Patient identified by MRN, date of birth, ID band Patient awake    Reviewed: Allergy & Precautions, NPO status   Airway Mallampati: II  TM Distance: >3 FB     Dental   Pulmonary pneumonia, Current Smoker,    breath sounds clear to auscultation       Cardiovascular hypertension, + angina + Peripheral Vascular Disease   Rhythm:Regular Rate:Normal     Neuro/Psych    GI/Hepatic Neg liver ROS, PUD,   Endo/Other  diabetes  Renal/GU Renal disease     Musculoskeletal   Abdominal   Peds  Hematology  (+) anemia ,   Anesthesia Other Findings   Reproductive/Obstetrics                            Anesthesia Physical Anesthesia Plan  ASA: III  Anesthesia Plan: MAC   Post-op Pain Management:    Induction: Intravenous  PONV Risk Score and Plan: Midazolam and Propofol infusion  Airway Management Planned: Nasal Cannula and Simple Face Mask  Additional Equipment:   Intra-op Plan:   Post-operative Plan:   Informed Consent: I have reviewed the patients History and Physical, chart, labs and discussed the procedure including the risks, benefits and alternatives for the proposed anesthesia with the patient or authorized representative who has indicated his/her understanding and acceptance.     Dental advisory given  Plan Discussed with: CRNA and Anesthesiologist  Anesthesia Plan Comments:         Anesthesia Quick Evaluation

## 2018-11-11 NOTE — Progress Notes (Signed)
[place] for Heparin  Indication: PE, DVT  Allergies  Allergen Reactions  . Lisinopril Anaphylaxis   Patient Measurements: Height: 5\' 11"  (180.3 cm) Weight: 124 lb (56.2 kg) IBW/kg (Calculated) : 75.3 Heparin Dosing Weight: 66.7  Vital Signs: Temp: 98.5 F (36.9 C) (02/20 1505) Temp Source: Oral (02/20 1505) BP: 107/88 (02/20 1505) Pulse Rate: 116 (02/20 1505)  Labs: Recent Labs    11/09/18 0338 11/10/18 0559  11/11/18 0010 11/11/18 0823 11/11/18 1635  HGB 7.0* 8.6*  --  8.7*  --   --   HCT 22.5* 26.5*  --  28.5*  --   --   PLT 303 322  --  358  --   --   HEPARINUNFRC  --   --    < > 0.67 0.77* 0.56  CREATININE 0.42* 0.47*  --  0.52*  --   --    < > = values in this interval not displayed.   Estimated Creatinine Clearance: 81 mL/min (A) (by C-G formula based on SCr of 0.52 mg/dL (L)).  Assessment: 58 yo male with arterial embolus s/p embolectomy. Patient had development of hematoma with compartment syndrome as well as hemorrhagic shock secondary to a bleeding duodenal ulcer.   Events this admission on 09/03/18:  LLE DVT/ischemia s/p urgent left popliteal artery embolectomy 12/13 >> RLE hematoma on 12/15 am s/p evacuation in OR.Heparin stopped 2/2 drop in hgb, hematemesis. -Heparin cautiously started 12/22,had few episodes of hematemesis & nose bleed 12/24> stopped heparin per dr. Candiss Norse S/p EUS 12/26:  pancreatic mass eroding into duodenum -VVS notes ikely too high risk for [facility]  12/28- Dr. Candiss Norse noted Continue PPI and Hold Heparin indefinitely  2/5: pharmacy consulted to dose heparin with no bolus by Dr Marin Olp for acute PE. Previously on 12/24 his heparin level was therapeutic at 0.35 on 1200 units/hr. 2/7: Heparin off at 09 am for EDG, heparin to resume after NG placed per GI instructions. KUB 1629> NG in stomach 2/13: Expl lap & Gastrojejunostomy- not able to use jejunostomy for enteral feeds. Heparin resumed at 2000 at rate of  1600 units/hr 2/14 heparin tx to lovenox (heparin at 1700 units/hr) 2/19 lovenox to heparin in anticipation of colonoscopy  11/11/2018  HL 0.56 - therapeutic after rate reduced to 1400 units/hr - H/H low, Hgb 8.7, PLT stable - rectal bleeding noted, ongoing from yesterday   Goal of Therapy:  Monitor platelets by anticoagulation protocol: Yes Heparin level 0.3-0.5 units/ml   Plan:   Continue Heparin drip at 1400 units/hr,  Hold heparin at 0230 on 2/21 for EGD  Daily CBC   Monitor for bleeding  Eudelia Bunch, Pharm.D [phone] 11/11/2018 5:09 PM

## 2018-11-11 NOTE — Progress Notes (Signed)
[place] for Heparin  Indication: PE, DVT  Allergies  Allergen Reactions  . Lisinopril Anaphylaxis   Patient Measurements: Height: 5\' 11"  (180.3 cm) Weight: 124 lb (56.2 kg) IBW/kg (Calculated) : 75.3 Heparin Dosing Weight: 66.7  Vital Signs: Temp: 98.2 F (36.8 C) (02/20 0634) Temp Source: Oral (02/20 0634) BP: 119/84 (02/20 0634) Pulse Rate: 83 (02/20 0634)  Labs: Recent Labs    11/09/18 0338 11/10/18 0559 11/10/18 1514 11/11/18 0010 11/11/18 0823  HGB 7.0* 8.6*  --  8.7*  --   HCT 22.5* 26.5*  --  28.5*  --   PLT 303 322  --  358  --   HEPARINUNFRC  --   --  0.66 0.67 0.77*  CREATININE 0.42* 0.47*  --  0.52*  --    Estimated Creatinine Clearance: 81 mL/min (A) (by C-G formula based on SCr of 0.52 mg/dL (L)).  Assessment: 58 yo male with arterial embolus s/p embolectomy. Patient had development of hematoma with compartment syndrome as well as hemorrhagic shock secondary to a bleeding duodenal ulcer.   Events this admission on 09/03/18:  LLE DVT/ischemia s/p urgent left popliteal artery embolectomy 12/13 >> RLE hematoma on 12/15 am s/p evacuation in OR.Heparin stopped 2/2 drop in hgb, hematemesis. -Heparin cautiously started 12/22,had few episodes of hematemesis & nose bleed 12/24> stopped heparin per dr. Candiss Norse S/p EUS 12/26:  pancreatic mass eroding into duodenum -VVS notes ikely too high risk for [facility]  12/28- Dr. Candiss Norse noted Continue PPI and Hold Heparin indefinitely  2/5: pharmacy consulted to dose heparin with no bolus by Dr Marin Olp for acute PE. Previously on 12/24 his heparin level was therapeutic at 0.35 on 1200 units/hr. 2/7: Heparin off at 09 am for EDG, heparin to resume after NG placed per GI instructions. KUB 1629> NG in stomach 2/13: Expl lap & Gastrojejunostomy- not able to use jejunostomy for enteral feeds. Heparin resumed at 2000 at rate of 1600 units/hr 2/14 heparin tx to lovenox (heparin at 1700 units/hr) 2/19  lovenox to heparin in anticipation of colonoscopy  11/11/2018  HL 0.77, supra-therapeutic - H/H low, Hgb 8.7, PLT stable - rectal bleeding noted, ongoing from yesterday   Goal of Therapy:  Monitor platelets by anticoagulation protocol: Yes Heparin level 0.3-0.5 units/ml   Plan:   Reduce Heparin drip to 1400 units/hr,   Recheck HL in 6 hours  Hold heparin at 0230 on 2/21 for EGD  Daily CBC   Monitor for bleeding   Dolly Rias RPh 11/11/2018, 9:39 AM Pager [phone]

## 2018-11-11 NOTE — Progress Notes (Addendum)
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  Denies any new complaints or concerns but he is ready to eat more of a regular diet.   Objective   Vital signs in last 24 hours: Temp:  [98.2 F (36.8 C)-98.4 F (36.9 C)] 98.2 F (36.8 C) (02/20 0634) Pulse Rate:  [83-109] 83 (02/20 0634) Resp:  [16-18] 17 (02/20 0634) BP: (113-141)/(84-105) 119/84 (02/20 0634) SpO2:  [100 %] 100 % (02/20 0634) Last BM Date: 11/09/18 General:    Cachectic AA male in NAD Heart:  Regular rate and rhythm; no murmurs Lungs: Respirations even and unlabored, lungs CTA bilaterally Abdomen:  Soft, nontender and nondistended. Normal bowel sounds. Extremities:  Without edema. Neurologic:  Alert and oriented,  grossly normal neurologically. Psych:  Cooperative. Normal mood and affect.  Intake/Output from previous day: 02/19 0701 - 02/20 0700 In: 3499.8 [P.O.:100; I.V.:3199.8; IV Piggyback:200] Out: 2671 [Urine:3325]  Lab Results: Recent Labs    11/09/18 0338 11/10/18 0559 11/11/18 0010  WBC 6.8 8.4 8.3  HGB 7.0* 8.6* 8.7*  HCT 22.5* 26.5* 28.5*  PLT 303 322 358   BMET Recent Labs    11/09/18 0338 11/10/18 0559 11/11/18 0010  NA 134* 132* 132*  K 4.1 4.5 4.5  CL 99 99 98  CO2 29 26 29   GLUCOSE 128* 152* 243*  BUN 17 16 16   CREATININE 0.42* 0.47* 0.52*  CALCIUM 8.0* 8.0* 8.1*   LFT Recent Labs    11/10/18 0559  PROT 6.3*  ALBUMIN 1.8*  AST 50*  ALT 45*  ALKPHOS 73  BILITOT 0.2*   Studies/Results: Ct Angio Abd/pel W/ And/or W/o  Result Date: 11/10/2018 CLINICAL DATA:   Pancreatic carcinoma with partial duodenal obstruction, gastric varices. GI bleed since gastrojejunostomy on 11/04/18^ EXAM: CTA ABDOMEN AND PELVIS wITHOUT AND WITH CONTRAST TECHNIQUE: Multidetector CT imaging of the abdomen and pelvis was performed using the standard protocol during bolus administration of intravenous contrast. Multiplanar reconstructed images and MIPs were obtained and reviewed to evaluate the vascular anatomy. CONTRAST:  151mL ISOVUE-370 IOPAMIDOL (ISOVUE-370) INJECTION 76% COMPARISON:  10/27/2018 FINDINGS: VASCULAR Aorta: Fusiform abdominal aortic aneurysm measured up to 3.1 cm maximum diameter, stable. No significant intraluminal thrombus. No dissection or stenosis. Celiac: Patent. The left gastric artery arises directly from the abdominal aorta, an anatomic variant. Metallic gastroduodenal embolization coils without evidence of recanalization. SMA: Patent without evidence of aneurysm, dissection, vasculitis or significant stenosis. Renals: Both renal arteries are patent without evidence of aneurysm, dissection, vasculitis, fibromuscular dysplasia or significant stenosis. IMA: Short-segment origin stenosis. Distally patent without evidence of aneurysm, dissection, vasculitis or significant stenosis. Inflow: Fusiform dilatation of the right common iliac artery up to 2.9 cm diameter. Fusiform dilatation of left common iliac artery up to 2.7 cm diameter with some eccentric nonocclusive mural thrombus. Tortuous ectatic distal iliac arterial systems without significant stenosis. Proximal Outflow: Bilateral common femoral and visualized portions of the superficial and profunda femoral arteries are patent without evidence of aneurysm, dissection, vasculitis or significant stenosis. Veins: Apparent splenic venous occlusion with collaterals from the splenic hilum extending along the anterior mesentery. Streak  artifact from GDA embolization coils obscures visualization of the portosplenic confluence. Main  portal vein and intrahepatic branches are patent. Hepatic veins patent. Review of the MIP images confirms the above findings. NON-VASCULAR Lower chest: Chronic atelectasis/scarring in the left lower lobe. No pleural or pericardial effusion. Hepatobiliary: No focal liver abnormality is seen. No gallstones, gallbladder wall thickening, or biliary dilatation. Pancreas: Decreased enhancement in the mid pancreatic body. Atrophy in the pancreatic tail. Some image degradation through the pancreatic head secondary to regional embolization coils. Spleen: Normal in size without focal lesion. Adrenals/Urinary Tract: Adrenal glands are unremarkable. Kidneys are normal, without renal calculi, focal lesion, or hydronephrosis. Bladder is unremarkable. Stomach/Bowel: Stomach is nondilated. Gastrojejunostomy staple line in the lateral gastric body. No evidence of active extravasation. Small bowel is decompressed. Moderate proximal colonic and rectal fecal material without dilatation. Lymphatic: No definite abdominal or pelvic adenopathy, evaluation limited secondary to paucity of fat and diffuse anasarca. Reproductive: Prostate is unremarkable. Other: No significant ascites. Scattered bubbles of free intraperitoneal air presumably postoperative. Musculoskeletal: Midline anterior abdominal wall skin staples. Lumbosacral spondylitic change. No fracture or worrisome bone lesion. IMPRESSION: VASCULAR 1. No evidence of active extravasation. 2. 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 3. 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 NON-VASCULAR 1. No acute findings. 2. Decreased enhancement in the mid pancreatic body suggesting residual/recurrent neoplasm. Electronically Signed   By: Lucrezia Europe M.D.   On: 11/10/2018 16:46     Assessment / Plan:   Assessment: 1.  History of gastric outlet obstruction from progressive pancreatic  cancer, duodenal ulcer previously causing massive upper GI bleed status post embolization 2.  Bright red blood per rectum: brbpr 2-3 times a day since surgery on 11/04/18, hgb 7.0 yesterday--1 unit prbcs-->8.7 today; Consider lower GI bleed vs upper gi bleed given recent   Plan: 1.  CT angio was unrevealing yesterday.  We will plan for EGD and colonoscopy tomorrow morning, 11/12/2026 830 with Dr. Hilarie Fredrickson.  Did discuss risks, benefits, limitations and alternatives and the patient agrees to proceed. 2.  Patient will be on a clear liquid diet today and n.p.o. after midnight 3.  Order movi prep to be started at 3 with split dose 4.  Did consult with pharmacy, they are planning on holding heparin infusion starting at 2:30 tomorrow morning which will be 6 hours before time of planned procedure. 5.  Continue other supportive measures 6.  Please await any further recommendations from Dr. Fuller Plan later today  Thank you for kind consultation, we will continue to follow.   LOS: 69 days   Ryan Medina  11/11/2018, 9:32 AM      Attending Physician Note   I have taken an interval history, reviewed the chart and examined the patient. I agree with the Advanced Practitioner's note, impression and recommendations.   Hematochezia has resolved however etiology not established on CTA. R/O ulcer, gastric varices, diverticular, etc. Colonoscopy/egd tomorrow ABL on chronic anemia. Maintain Hgb > 7 Pancreatic adenocarcinoma with GOO S/P gastrojejunostomy, duodenal ulcer S/P embolization for bleed   Ryan Edward, MD [facility] [phone]

## 2018-11-11 NOTE — Progress Notes (Signed)
7 Days Post-Op    NT:[id number] pancreatic cancer with gastric outlet syndrome  Subjective: Sleeping, no complaints, tolerating clear liquids.  Objective: Vital signs in last 24 hours: Temp:  [98.2 F (36.8 C)-98.4 F (36.9 C)] 98.2 F (36.8 C) (02/20 0634) Pulse Rate:  [83-109] 83 (02/20 0634) Resp:  [16-18] 17 (02/20 0634) BP: (113-141)/(84-105) 119/84 (02/20 0634) SpO2:  [100 %] 100 % (02/20 0634) Last BM Date: 11/09/18 100 p.o. recorded but the staff thinks he stashing food and eating when they are not in the room. 3199 IV 3325 urine BM x1 Labs are stable this a.m.     Intake/Output from previous day: 02/19 0701 - 02/20 0700 In: 3499.8 [P.O.:100; I.V.:3199.8; IV Piggyback:200] Out: 9163 [Urine:3325] Intake/Output this shift: No intake/output data recorded.  General appearance: He was sleeping when I came in, wakes up and is doing well no complaints. Resp: clear to auscultation bilaterally GI: Soft, sore, incision looks good abdomen soft not distended.  Lab Results:  Recent Labs    11/10/18 0559 11/11/18 0010  WBC 8.4 8.3  HGB 8.6* 8.7*  HCT 26.5* 28.5*  PLT 322 358    BMET Recent Labs    11/10/18 0559 11/11/18 0010  NA 132* 132*  K 4.5 4.5  CL 99 98  CO2 26 29  GLUCOSE 152* 243*  BUN 16 16  CREATININE 0.47* 0.52*  CALCIUM 8.0* 8.1*   PT/INR No results for input(s): LABPROT, INR in the last 72 hours.  Recent Labs  Lab 11/06/18 0641 11/08/18 0455 11/09/18 0338 11/10/18 0559  AST _0 50*  ALT _1 45*  ALKPHOS 59 65 64 73  BILITOT 0.4 0.3 0.2* 0.2*  PROT 6.1* 6.2* 6.1* 6.3*  ALBUMIN 1.6* 1.6* 1.7* 1.8*     Lipase     Component Value Date/Time   LIPASE 42 08/22/2018 1932     Medications: . heparin lock flush  500 Units Intracatheter Q30 days  . insulin aspart  0-20 Units Subcutaneous Q4H  . pantoprazole (PROTONIX) IV  40 mg Intravenous Q12H  . 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 Chronic pain/hx of substance abuse -ETOH/Cocaine/tobacco Anemia H/H: 7.8/25.2>>7.0/22.5; being transfused 11/09/18 Hx exploratory laparotomy,closure of pyloric channel ulcer with Phillip Heal patch 12/2003 Dr. 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 Johnathan Hausen POD# 7  FEN:IV fluids/TPN/ sips of clears >> clears from the floor ID: Diflucan 2/7>>2/12 (candida esophagitis) cefotetan preop 11/04/2018 DVT: SCD/Lovenox Follow up: TBD  Plan: Advance diet.  If he does well with full liquids we can start weaning his TPN tomorrow, and continue to advance and see how he does.   LOS: 69 days    Kimberlye Dilger 11/11/2018 [phone]

## 2018-11-11 NOTE — Progress Notes (Signed)
Nutrition Follow-up  DOCUMENTATION CODES:   Severe malnutrition in context of chronic illness, Underweight  INTERVENTION:   -TPN per Pharmacy -Resume Ensure Enlive po QID, each supplement provides 350 kcal and 20 grams of protein -Provide Magic cup BID with meals, each supplement provides 290 kcal and 9 grams of protein   NUTRITION DIAGNOSIS:   Severe Malnutrition related to chronic illness(Pancreatic cancer, DM and CHF) as evidenced by severe muscle depletion, severe fat depletion, percent weight loss.  Ongoing.  GOAL:   Patient will meet greater than or equal to 90% of their needs  Progressing.  MONITOR:   PO intake, Supplement acceptance, Labs, Weight trends, I & O's(TPN)  REASON FOR ASSESSMENT:   Consult Assessment of nutrition requirement/status  ASSESSMENT:   58 y.o male with PMH: recent dx pancreatic cancer, DM, CHF, crack cocaine use, emboli of legs, smoker. Pt admitted with admitted with DKA and GI bleed.   Significant events at [facility]: 12/13 embolectomy surgery for acute popliteal and tibial emboli 12/15 fasciotomy for RLE hematoma  12/17 Developed hemorrhagic shock secondary to bleeding duodenal ulcer requiring transfer to ICU for intubation 12/22 MRI showed pancreatic mass 12/26 Endoscopic ultrasound by Gi showed adenocarcinoma 1/5- Transferred back to ICU with hypotension anemia and GI bleed from invading pancreatic mass  [facility]: 1/7- Transferred to [facility] for radiation 1/22 -completed course of radiation 2/5: CT scan reveals enlargement of pancreatic mass, dilated stomach which is fluid and food filled 2/7 s/p EGD -tumor occluding the gastric outlet at the level of the pylorus. 2/8 -NGT pulled out, started on CLD 2/11: [place] Count completed, pt meeting 65-69% of estimated needs 2/13:LAPAROSCOPY [facility] 2/14: TPN initiated via port  Patient now receiving TPN at goal rate of 85 ml/hr (providing 2060 kcal, 102g protein).  Diet has been advanced to full liquids. Pt currently eating pork rinds at bedside. It seems patient had found some in his bag of belongings. Pt had already finished bag by the time RD explained he was on a full liquid diet.  Pt feels very hungry and he wants to resume Ensure supplements. Will also order Magic Cups to come on lunch/dinner trays. Per GI note, pt to be NPO for EGD and colonoscopy 2/21.  Patient continues to need a new weight measured. Last recorded 2/13.  Medications reviewed. Labs reviewed: CBGs: 121-135 Low Na Mg/Phos WNL  Diet Order:   Diet Order            Diet NPO time specified  Diet effective midnight        Diet full liquid Room service appropriate? Yes; Fluid consistency: Thin  Diet effective now              EDUCATION NEEDS:   Education needs have been addressed  Skin:  Skin Assessment: Skin Integrity Issues: Skin Integrity Issues:: Stage II Stage II: coccyx  Last BM:  2/18  Height:   Ht Readings from Last 1 Encounters:  11/04/18 5\' 11"  (1.803 m)    Weight:   Wt Readings from Last 1 Encounters:  11/04/18 56.2 kg    Ideal Body Weight:  78 kg  BMI:  Body mass index is 17.29 kg/m.  Estimated Nutritional Needs:   Kcal:  2000-2200 kcal  Protein:  100-110 grams  Fluid:  >/= 2L/day  Clayton Bibles, MS, RD, LDN [facility] Inpatient Clinical Dietitian Pager: [phone] After Hours Pager: [phone]

## 2018-11-11 NOTE — Progress Notes (Signed)
PROGRESS NOTE    Ryan Medina  ZOX:[id number] DOB: [birth year] DOA: 09/03/2018 PCP: Patient, No Pcp Per    Brief Narrative: 58 year old with history of peripheral vascular disease, dilated cardiomyopathy, essential hypertension, diabetes mellitus type 2 initially came to the hospital with right lower extremity pain and was found to have ischemic leg requiring embolectomy on 12/13. He was placed on heparin drip subsequently developed right lower extremity hematoma requiring fasciotomy on 12/15. Unfortunately afterwards he decompensated with active GI bleed and was intubated and was moved to the ICU. He required embolization on 12/17. During the course of his hospitalization he is required total of 25 units of blood. Further evaluation showed pancreatic cancer with duodenal ulcer. Transferred to [place] [facility] for radiation treatment to help with bleeding. Hospital course complicated by sepsis from healthcare acquired pneumonia and Pseudomonas UTI requiring treatment with vancomycin and cefepime.58 58 Patient found to have chronic PE therefore started on heparin drip per oncology. Repeat endoscopy showed worsening of gastric outlet obstruction. Initially NG tube placed by patient removed it. Right now on clear liquid diet   Assessment & Plan:   Principal Problem:   Popliteal artery embolus (HCC) Active Problems:   HCAP (healthcare-associated pneumonia)   DM (diabetes mellitus), type 2, uncontrolled ([place])   Essential hypertension   Dilated cardiomyopathy (HCC)   Chest pain   AAA (abdominal aortic aneurysm) (HCC)   Ischemic foot   Right leg claudication (HCC)   PVD (peripheral vascular disease) ([place])   Hemorrhagic shock ([place])   Nontraumatic ischemic infarction of muscle of right lower leg   Upper GI bleed   Cocaine abuse ([place])   Acute blood loss anemia   Chronic pain syndrome   Duodenal ulcer with hemorrhage   Pancreatic cancer ([place])   Goals of care, counseling/discussion   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  Patient was transfused a unit packed red blood cells overnight.  Hemoglobin currently at 8.5 from 8.6 from 7.0.  Questionable etiology.  Patient being followed by oncology who anticipating starting patient on chemotherapy and requesting that GI reassess patient for possible colonoscopy for further evaluation prior to initiation of chemotherapy.  Patient has been changed to IV heparin per oncology.  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  Patient for colonoscopy/EGD tomorrow per GI for further evaluation.  GI following and appreciate their input and recommendations.   Adenocarcinoma of pancreas Hemorrhagic shock due to GI bleed from duodenal ulcer, stable Gastric outlet obstruction causing dysphagia and abdominal distention -CT scan showed increase in size of the mass with involvement of intra-abdominal arteries, deemed to be inoperable. Status post radiation. Per oncology plans for outpatient radiation and chemotherapy -Status post endoscopy 12/17, GDA embolization 12/17, status post radiation. -Continue PPI. -Endoscopy showed  worsening of gastric outlet obstruction/pyloric stenosis  secondary to malignancy. Candida esophagitis -S/Plaparoscopy open gastrojejunostomy no pancreatic mass amenable to biopsy -PICC WAS NOT PLACED AS HE HAD A PORT .Continue TPN. -Patient on clear liquids which has been advanced to full liquids, for now in anticipation of further evaluation of rectal bleeding.  Patient for probable upper endoscopy and colonoscopy tomorrow.  Transfusion threshold hemoglobin less than 7.  General surgery following.  GI following.  Candida esophagitis Status post full course of Diflucan.   Chronic bilateral pulmonary embolismstarted Lovenox Lovenox discontinued the morning of 11/10/2018, patient placed on heparin due to bright red blood per rectum.  Per oncology.  Anemia of chronic disease Status post multiple blood transfusions25 blood transfusions Had bloody bowel movements over the past few days initially felt secondary to constipation.  Hemoglobin was 7.  Status post 1 unit packed red blood cells.  Hemoglobin currently at 8.7.  Follow H&H.   Hypokalemia/hypomagnesemia -Potassium repleted and at 4.5.  Magnesium at 2.0.   Sepsis secondary to H CAP and Pseudomonas urinary tract infection -Status post full course of IV vancomycin IV cefepime. PICC line pulled out 1/26.  Ischemic right foot with right popliteal and tibial artery embolus -Status post embolectomy. Complicated by compartment syndrome requiring fasciotomy.  Cardiomyopathy, combined systolic and diastolic CHF, chronic -Patient currently euvolemic.  Follow.   Uncontrolled diabetes mellitus type 2 -Hemoglobin A1c 18.2. CBG of 145 this morning.  Continue Lantus and sliding scale insulin.  Follow.   Severe protein calorie malnutrition Continue TPN.  Patient tolerating clears.  Generalized weakness-physical therapy/Occupational Therapy-recommending patient seen by PT/OT who are recommending SNF.    Severe protein calorie malnutrition Patient on TPN.  Patient tolerating clears  and has been advanced to full liquids.  Once patient's diet has been advanced to a solid and soft diet TNA could likely be weaned off.    DVT prophylaxis: Heparin Code Status: Full Family Communication: Updated patient, brother and sister at bedside. Disposition Plan: To be determined.  Patient homeless and likely not a suitable candidate for shelter at this time due to ongoing medical issues.   Consultants:   Gastroenterology  Oncology  Conventional radiology  Pulmonary and critical care medicine  Vascular surgery  Palliative care  General surgery  Cardiology  Procedures:  Laparoscopy, open gastrojejunostomy per Dr. Hassell Done 11/04/2018  Endoscopy 09/07/2018  EUS 09/16/2018  Chemo-port placement 10/12/2018  Repeat endoscopy 10/29/2018--per Dr. Loletha Carrow III  Antimicrobials:   IV vancomycin--finished course  IV cefepime--- finished course   Subjective: Patient in bed.  Patient states had brown stool and denies any further bloody bowel movements.  Denies any chest pain or shortness of breath.  Tolerating current full liquid diet.   Objective: Vitals:   11/10/18 1429 11/10/18 1448 11/10/18 2112 11/11/18 0634  BP: (!) 141/105 (!) 129/94 113/87 119/84  Pulse: (!) 108 98 (!) 109 83  Resp: 16  18 17   Temp: 98.3 F (36.8 C)  98.4 F (36.9 C) 98.2 F (36.8 C)  TempSrc: Oral  Oral Oral  SpO2: 100%  100% 100%  Weight:      Height:        Intake/Output Summary (Last 24 hours) at 11/11/2018 1225 Last data filed at 11/11/2018 1011 Gross per 24 hour  Intake 2160.02 ml  Output 3125 ml  Net -964.98 ml   Filed Weights   10/29/18 1219 11/02/18 1806 11/04/18 0831  Weight: 54.7 kg 56.2 kg 56.2 kg    Examination:  General exam: NAD Respiratory  system: Lungs clear to auscultation bilaterally.  No wheezes, no crackles, no rhonchi.  Normal respiratory effort.  Cardiovascular system: Regular rate rhythm no murmurs rubs or gallops.  No JVD.  No lower extremity edema.    Gastrointestinal system: Abdomen is soft, nontender, nondistended, positive bowel sounds.  Midabdominal surgical wound clean dry and intact with staples in place.  Central nervous system: Alert and oriented. No focal neurological deficits. Extremities: Right foot with dressing and dark.  Symmetric 5 x 5 power. Skin: No rashes, lesions or ulcers Psychiatry: Judgement and insight appear normal. Mood & affect appropriate.     Data Reviewed: I have personally reviewed following labs and imaging studies  CBC: Recent Labs  Lab 11/06/18 0641 11/08/18 0111 11/08/18 0455 11/09/18 0338 11/10/18 0559 11/11/18 0010  WBC 9.4 9.5 6.2 6.8 8.4 8.3  NEUTROABS 8.0*  --  5.0  --   --   --   HGB 10.6* 8.1* 7.8* 7.0* 8.6* 8.7*  HCT 33.7* 26.6* 25.2* 22.5* 26.5* 28.5*  MCV 90.3 93.3 91.6 88.9 89.5 91.6  PLT 318 347 297 303 322 975   Basic Metabolic Panel: Recent Labs  Lab 11/07/18 0608 11/08/18 0455 11/09/18 0338 11/10/18 0559 11/11/18 0010  NA 137 135 134* 132* 132*  K 3.3* 4.8 4.1 4.5 4.5  CL 99 100 99 99 98  CO2 28 27 29 26 29   GLUCOSE 144* 159* 128* 152* 243*  BUN 9 28* 17 16 16   CREATININE 0.41* 0.49* 0.42* 0.47* 0.52*  CALCIUM 8.1* 8.0* 8.0* 8.0* 8.1*  MG 1.8 1.7 1.9 2.0 2.0  PHOS 3.1 2.9 3.9 4.1 3.9   GFR: Estimated Creatinine Clearance: 81 mL/min (A) (by C-G formula based on SCr of 0.52 mg/dL (L)). Liver Function Tests: Recent Labs  Lab 11/06/18 0641 11/08/18 0455 11/09/18 0338 11/10/18 0559  AST 15 21 23  50*  ALT 15 22 24  45*  ALKPHOS 59 65 64 73  BILITOT 0.4 0.3 0.2* 0.2*  PROT 6.1* 6.2* 6.1* 6.3*  ALBUMIN 1.6* 1.6* 1.7* 1.8*   No results for input(s): LIPASE, AMYLASE in the last 168 hours. No results for input(s): AMMONIA in the last 168 hours. Coagulation Profile: No results for input(s): INR, PROTIME in the last 168 hours. Cardiac Enzymes: No results for input(s): CKTOTAL, CKMB, CKMBINDEX, TROPONINI in the last 168 hours. BNP (last 3 results) No results  for input(s): PROBNP in the last 8760 hours. HbA1C: No results for input(s): HGBA1C in the last 72 hours. CBG: Recent Labs  Lab 11/10/18 1936 11/10/18 2334 11/11/18 0340 11/11/18 0808 11/11/18 1201  GLUCAP 93 233* 121* 135* 145*   Lipid Profile: No results for input(s): CHOL, HDL, LDLCALC, TRIG, CHOLHDL, LDLDIRECT in the last 72 hours. Thyroid Function Tests: No results for input(s): TSH, T4TOTAL, FREET4, T3FREE, THYROIDAB in the last 72 hours. Anemia Panel: No results for input(s): VITAMINB12, FOLATE, FERRITIN, TIBC, IRON, RETICCTPCT in the last 72 hours. Sepsis Labs: No results for input(s): PROCALCITON, LATICACIDVEN in the last 168 hours.  No results found for this or any previous visit (from the past 240 hour(s)).       Radiology Studies: Ct Angio Abd/pel W/ And/or W/o  Result Date: 11/10/2018 CLINICAL DATA:  Pancreatic carcinoma with partial duodenal obstruction, gastric varices. GI bleed since gastrojejunostomy on 11/04/18^ EXAM: CTA ABDOMEN AND PELVIS wITHOUT AND WITH CONTRAST TECHNIQUE: Multidetector CT imaging of the abdomen and pelvis was performed using the standard protocol during bolus administration of intravenous contrast. Multiplanar reconstructed images and MIPs were  obtained and reviewed to evaluate the vascular anatomy. CONTRAST:  175m ISOVUE-370 IOPAMIDOL (ISOVUE-370) INJECTION 76% COMPARISON:  10/27/2018 FINDINGS: VASCULAR Aorta: Fusiform abdominal aortic aneurysm measured up to 3.1 cm maximum diameter, stable. No significant intraluminal thrombus. No dissection or stenosis. Celiac: Patent. The left gastric artery arises directly from the abdominal aorta, an anatomic variant. Metallic gastroduodenal embolization coils without evidence of recanalization. SMA: Patent without evidence of aneurysm, dissection, vasculitis or significant stenosis. Renals: Both renal arteries are patent without evidence of aneurysm, dissection, vasculitis, fibromuscular dysplasia or  significant stenosis. IMA: Short-segment origin stenosis. Distally patent without evidence of aneurysm, dissection, vasculitis or significant stenosis. Inflow: Fusiform dilatation of the right common iliac artery up to 2.9 cm diameter. Fusiform dilatation of left common iliac artery up to 2.7 cm diameter with some eccentric nonocclusive mural thrombus. Tortuous ectatic distal iliac arterial systems without significant stenosis. Proximal Outflow: Bilateral common femoral and visualized portions of the superficial and profunda femoral arteries are patent without evidence of aneurysm, dissection, vasculitis or significant stenosis. Veins: Apparent splenic venous occlusion with collaterals from the splenic hilum extending along the anterior mesentery. Streak artifact from GDA embolization coils obscures visualization of the portosplenic confluence. Main portal vein and intrahepatic branches are patent. Hepatic veins patent. Review of the MIP images confirms the above findings. NON-VASCULAR Lower chest: Chronic atelectasis/scarring in the left lower lobe. No pleural or pericardial effusion. Hepatobiliary: No focal liver abnormality is seen. No gallstones, gallbladder wall thickening, or biliary dilatation. Pancreas: Decreased enhancement in the mid pancreatic body. Atrophy in the pancreatic tail. Some image degradation through the pancreatic head secondary to regional embolization coils. Spleen: Normal in size without focal lesion. Adrenals/Urinary Tract: Adrenal glands are unremarkable. Kidneys are normal, without renal calculi, focal lesion, or hydronephrosis. Bladder is unremarkable. Stomach/Bowel: Stomach is nondilated. Gastrojejunostomy staple line in the lateral gastric body. No evidence of active extravasation. Small bowel is decompressed. Moderate proximal colonic and rectal fecal material without dilatation. Lymphatic: No definite abdominal or pelvic adenopathy, evaluation limited secondary to paucity of fat and  diffuse anasarca. Reproductive: Prostate is unremarkable. Other: No significant ascites. Scattered bubbles of free intraperitoneal air presumably postoperative. Musculoskeletal: Midline anterior abdominal wall skin staples. Lumbosacral spondylitic change. No fracture or worrisome bone lesion. IMPRESSION: VASCULAR 1. No evidence of active extravasation. 2. 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 3. 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 NON-VASCULAR 1. No acute findings. 2. Decreased enhancement in the mid pancreatic body suggesting residual/recurrent neoplasm. Electronically Signed   By: DLucrezia EuropeM.D.   On: 11/10/2018 16:46        Scheduled Meds: . feeding supplement (ENSURE ENLIVE)  237 mL Oral QID  . heparin lock flush  500 Units Intracatheter Q30 days  . insulin aspart  0-20 Units Subcutaneous Q4H  . pantoprazole (PROTONIX) IV  40 mg Intravenous Q12H  . peg 3350 powder  0.5 kit Oral 2 times per day on Thu   Continuous Infusions: . sodium chloride 10 mL/hr at 11/11/18 0400  . heparin 1,500 Units/hr (11/11/18 0400)  . methocarbamol (ROBAXIN) IV 500 mg (11/11/18 0537)  . TPN ADULT (ION) 85 mL/hr at 11/11/18 0400  . TPN ADULT (ION)       LOS: 69 days    Time spent: 40 minutes    DIrine Seal MD [facility]  If 7PM-7AM, please contact night-coverage [email or web address] 11/11/2018, 12:25 PM

## 2018-11-12 ENCOUNTER — Inpatient Hospital Stay (HOSPITAL_COMMUNITY): Payer: Medicaid Other | Admitting: Anesthesiology

## 2018-11-12 ENCOUNTER — Encounter (HOSPITAL_COMMUNITY): Payer: Self-pay | Admitting: Anesthesiology

## 2018-11-12 ENCOUNTER — Encounter (HOSPITAL_COMMUNITY)
Admission: EM | Disposition: A | Discharge status: Skilled Nursing Facility | Payer: Self-pay | Source: Home / Self Care | Attending: Internal Medicine

## 2018-11-12 DIAGNOSIS — Z980 Intestinal bypass and anastomosis status: Secondary | ICD-10-CM

## 2018-11-12 DIAGNOSIS — K283 Acute gastrojejunal ulcer without hemorrhage or perforation: Secondary | ICD-10-CM | POA: Diagnosis not present

## 2018-11-12 HISTORY — PX: ESOPHAGOGASTRODUODENOSCOPY (EGD) WITH PROPOFOL: SHX5813

## 2018-11-12 LAB — CBC
HCT: 27.9 % — ABNORMAL LOW (ref 39.0–52.0)
Hemoglobin: 8.5 g/dL — ABNORMAL LOW (ref 13.0–17.0)
MCH: 28.1 pg (ref 26.0–34.0)
MCHC: 30.5 g/dL (ref 30.0–36.0)
MCV: 92.1 fL (ref 80.0–100.0)
NRBC: 0 % (ref 0.0–0.2)
Platelets: 375 10*3/uL (ref 150–400)
RBC: 3.03 MIL/uL — ABNORMAL LOW (ref 4.22–5.81)
RDW: 15.5 % (ref 11.5–15.5)
WBC: 8.1 10*3/uL (ref 4.0–10.5)

## 2018-11-12 LAB — BASIC METABOLIC PANEL
ANION GAP: 7 (ref 5–15)
BUN: 19 mg/dL (ref 6–20)
CO2: 29 mmol/L (ref 22–32)
Calcium: 8.3 mg/dL — ABNORMAL LOW (ref 8.9–10.3)
Chloride: 100 mmol/L (ref 98–111)
Creatinine, Ser: 0.4 mg/dL — ABNORMAL LOW (ref 0.61–1.24)
GFR calc Af Amer: >60 mL/min (ref >60)
GFR calc non Af Amer: >60 mL/min (ref >60)
Glucose, Bld: 142 mg/dL — ABNORMAL HIGH (ref 70–99)
Potassium: 4.6 mmol/L (ref 3.5–5.1)
Sodium: 136 mmol/L (ref 135–145)

## 2018-11-12 LAB — TYPE AND SCREEN
ABO/RH(D): B POS
Antibody Screen: NEGATIVE
Unit division: 0
Unit division: 0

## 2018-11-12 LAB — BPAM RBC
Blood Product Expiration Date: 202003102359
Blood Product Expiration Date: 202003102359
ISSUE DATE / TIME: 202002180722
Unit Type and Rh: 7300
Unit Type and Rh: 7300

## 2018-11-12 LAB — GLUCOSE, CAPILLARY
GLUCOSE-CAPILLARY: 229 mg/dL — AB (ref 70–99)
Glucose-Capillary: 103 mg/dL — ABNORMAL HIGH (ref 70–99)
Glucose-Capillary: 126 mg/dL — ABNORMAL HIGH (ref 70–99)
Glucose-Capillary: 154 mg/dL — ABNORMAL HIGH (ref 70–99)
Glucose-Capillary: 159 mg/dL — ABNORMAL HIGH (ref 70–99)
Glucose-Capillary: 201 mg/dL — ABNORMAL HIGH (ref 70–99)

## 2018-11-12 LAB — MAGNESIUM: Magnesium: 2.1 mg/dL (ref 1.7–2.4)

## 2018-11-12 LAB — PHOSPHORUS: Phosphorus: 3.9 mg/dL (ref 2.5–4.6)

## 2018-11-12 LAB — HEPARIN LEVEL (UNFRACTIONATED): Heparin Unfractionated: 0.28 IU/mL — ABNORMAL LOW (ref 0.30–0.70)

## 2018-11-12 SURGERY — ESOPHAGOGASTRODUODENOSCOPY (EGD) WITH PROPOFOL
Anesthesia: Monitor Anesthesia Care

## 2018-11-12 MED ORDER — LIDOCAINE 2% (20 MG/ML) 5 ML SYRINGE
INTRAMUSCULAR | Status: DC | PRN
  Administered 2018-11-12: 100 mg via INTRAVENOUS

## 2018-11-12 MED ORDER — FENTANYL CITRATE (PF) 100 MCG/2ML IJ SOLN
INTRAMUSCULAR | Status: DC | PRN
  Administered 2018-11-12 (×4): 25 ug via INTRAVENOUS

## 2018-11-12 MED ORDER — PHENYLEPHRINE 40 MCG/ML (10ML) SYRINGE FOR IV PUSH (FOR BLOOD PRESSURE SUPPORT)
PREFILLED_SYRINGE | INTRAVENOUS | Status: DC | PRN
  Administered 2018-11-12: 80 ug via INTRAVENOUS

## 2018-11-12 MED ORDER — PROPOFOL 10 MG/ML IV BOLUS
INTRAVENOUS | Status: AC
  Filled 2018-11-12: qty 40

## 2018-11-12 MED ORDER — SUCRALFATE 1 GM/10ML PO SUSP
1.0000 g | Freq: Three times a day (TID) | ORAL | Status: DC
  Administered 2018-11-12 – 2018-12-15 (×129): 1 g via ORAL
  Filled 2018-11-12 (×127): qty 10

## 2018-11-12 MED ORDER — HEPARIN (PORCINE) 25000 UT/250ML-% IV SOLN
1450.0000 [IU]/h | INTRAVENOUS | Status: DC
  Administered 2018-11-12 – 2018-11-16 (×6): 1450 [IU]/h via INTRAVENOUS
  Filled 2018-11-12 (×9): qty 250

## 2018-11-12 MED ORDER — HEPARIN (PORCINE) 25000 UT/250ML-% IV SOLN
1400.0000 [IU]/h | INTRAVENOUS | Status: DC
  Administered 2018-11-12: 1400 [IU]/h via INTRAVENOUS
  Filled 2018-11-12: qty 250

## 2018-11-12 MED ORDER — TRAVASOL 10 % IV SOLN
INTRAVENOUS | Status: AC
  Administered 2018-11-12: 19:00:00 via INTRAVENOUS
  Filled 2018-11-12: qty 1020

## 2018-11-12 MED ORDER — OXYCODONE HCL 5 MG/5ML PO SOLN
7.5000 mg | ORAL | Status: DC | PRN
  Administered 2018-11-12 – 2018-12-13 (×58): 7.5 mg via ORAL
  Filled 2018-11-12 (×60): qty 10

## 2018-11-12 MED ORDER — PROPOFOL 10 MG/ML IV BOLUS
INTRAVENOUS | Status: DC | PRN
  Administered 2018-11-12 (×3): 20 mg via INTRAVENOUS

## 2018-11-12 MED ORDER — ACETAMINOPHEN 500 MG PO TABS
1000.0000 mg | ORAL_TABLET | Freq: Three times a day (TID) | ORAL | Status: DC
  Administered 2018-11-12 – 2018-12-01 (×39): 1000 mg via ORAL
  Filled 2018-11-12 (×44): qty 2

## 2018-11-12 MED ORDER — FENTANYL CITRATE (PF) 100 MCG/2ML IJ SOLN
INTRAMUSCULAR | Status: AC
  Filled 2018-11-12: qty 2

## 2018-11-12 MED ORDER — HYDROMORPHONE HCL 1 MG/ML IJ SOLN
0.5000 mg | INTRAMUSCULAR | Status: DC | PRN
  Administered 2018-11-12 – 2018-11-21 (×26): 1 mg via INTRAVENOUS
  Filled 2018-11-12 (×26): qty 1

## 2018-11-12 MED ORDER — SODIUM CHLORIDE 0.9 % IV BOLUS
500.0000 mL | Freq: Once | INTRAVENOUS | Status: AC
  Administered 2018-11-12: 500 mL via INTRAVENOUS

## 2018-11-12 MED ORDER — PROPOFOL 500 MG/50ML IV EMUL
INTRAVENOUS | Status: DC | PRN
  Administered 2018-11-12: 120 ug/kg/min via INTRAVENOUS

## 2018-11-12 SURGICAL SUPPLY — 25 items

## 2018-11-12 NOTE — Progress Notes (Signed)
PT REFUSING TO DRINK MOVIPREP. EDUCATION GIVEN PT STATED "IM NOT DRINKING ALL THAT STUFF". TRH NOTIFIED OF SAME.

## 2018-11-12 NOTE — Progress Notes (Signed)
PT WAS ON ABLE TO INTAKE HALF O MOVIPREP THAT WAS GIVEN AT 1600 ON 11/11/18. HE REFUSED TO START NEW AT 11/11/18

## 2018-11-12 NOTE — Op Note (Signed)
[facility] Patient Name: Ryan Medina Procedure Date: 11/12/2018 MRN: [id number] Attending MD: Jerene Bears , MD Date of Birth: 08/26/1961 CSN: [id number] Age: 58 Admit Type: Inpatient Procedure:                Upper GI endoscopy Indications:              Hematochezia, patient with history of pancreatic                            adenocarcinoma with duodenal invasion and previous                            UGI bleeding s/p GDA embolization Dec 2019, recent                            gastrojejunostomy for GOO Providers:                Lajuan Lines. Hilarie Fredrickson, MD, Zenon Mayo, RN, Charolette Child,                            Technician, Danley Danker, CRNA Referring MD:             [facility] Medicines:                Monitored Anesthesia Care Complications:            No immediate complications. Estimated Blood Loss:     Estimated blood loss: none. Procedure:                Pre-Anesthesia Assessment:                           - Prior to the procedure, a History and Physical                            was performed, and patient medications and                            allergies were reviewed. The patient's tolerance of                            previous anesthesia was also reviewed. The risks                            and benefits of the procedure and the sedation                            options and risks were discussed with the patient.                            All questions were answered, and informed consent                            was obtained. Prior Anticoagulants: The patient has  taken no previous anticoagulant or antiplatelet                            agents. ASA Grade Assessment: III - A patient with                            severe systemic disease. After reviewing the risks                            and benefits, the patient was deemed in                            satisfactory condition to undergo the procedure.                        After obtaining informed consent, the endoscope was                            passed under direct vision. Throughout the                            procedure, the patient's blood pressure, pulse, and                            oxygen saturations were monitored continuously. The                            GIF-H190 ([id number]) Olympus gastroscope was                            introduced through the mouth, and advanced to the                            jejunum. Scope In: Scope Out: Findings:      The examined esophagus was normal. No esophageal varices.      Evidence of a gastrojejunostomy was found in the gastric body. This was       characterized by ulceration at the anastomosis. This area is difficult       to examine given anatomy/location. There is a visible stable at the       ulceration but no visible vessel was seen. There was no evidence of       active or recent bleeding.      A known malignant-appearing, intrinsic severe stenosis was found at the       pylorus. This was not traversed.      An examination of the duodenum was not performed. Impression:               - Normal esophagus. No varices.                           - Small hiatal hernia.                           - A gastrojejunostomy was found, characterized by  ulceration which was not bleeding today (though                            likely source of recent hematochezia)                           - Gastric stenosis was found at the pylorus from                            known invasive adenocarcinoma.                           - No specimens collected. Moderate Sedation:      N/A Recommendation:           - Return patient to hospital [facility] for ongoing care.                           - Advance diet as tolerated.                           - Continue present medications.                           - BID IV PPI, though can change to PO BID when diet                             advanced. Will need liquid PPI or capsule which can                            be opened and sprinkled on applesauce for                            administration (pantoprazole is a tablet and                            absorption will be impaired given                            gastrojejunostomy).                           - Closely monitor Hgb, transfuse when needed. Procedure Code(s):        --- Professional ---                           [phone], Esophagogastroduodenoscopy, flexible,                            transoral; diagnostic, including collection of                            specimen(s) by brushing or washing, when performed                            (separate procedure) Diagnosis Code(s):        ---  Professional ---                           Z98.0, Intestinal bypass and anastomosis status                           K31.1, Adult hypertrophic pyloric stenosis                           K92.1, Melena (includes Hematochezia) CPT copyright 2018 American Medical Association. All rights reserved. The codes documented in this report are preliminary and upon coder review may  be revised to meet current compliance requirements. Jerene Bears, MD 11/12/2018 9:14:47 AM This report has been signed electronically. Number of Addenda: 0

## 2018-11-12 NOTE — Anesthesia Postprocedure Evaluation (Signed)
Anesthesia Post Note  Patient: Ryan Medina  Procedure(s) Performed: ESOPHAGOGASTRODUODENOSCOPY (EGD) WITH PROPOFOL (N/A ) COLONOSCOPY WITH PROPOFOL (N/A )     Patient location during evaluation: Endoscopy Anesthesia Type: MAC Level of consciousness: awake Pain management: pain level controlled Vital Signs Assessment: post-procedure vital signs reviewed and stable Respiratory status: spontaneous breathing Cardiovascular status: stable Postop Assessment: no apparent nausea or vomiting Anesthetic complications: no    Last Vitals:  Vitals:   11/12/18 0905 11/12/18 0908  BP: 109/73 95/74  Pulse: 94 98  Resp: 15 15  Temp:    SpO2: 100% 100%    Last Pain:  Vitals:   11/12/18 0858  TempSrc: Oral  PainSc: 9                  Jermari Tamargo

## 2018-11-12 NOTE — Progress Notes (Signed)
PT Cancellation Note  Patient Details Name: Ryan Medina MRN: [id number] DOB: 02/27/1961   Cancelled Treatment:    Reason Eval/Treat Not Completed: Pain limiting ability to participate;Fatigue/lethargy limiting ability to participate  Julien Girt, PT Acute Rehabilitation Services Pager [phone]  Office [phone]   Roxine Caddy D Elonda Husky 11/12/2018, 3:14 PM

## 2018-11-12 NOTE — Interval H&P Note (Signed)
History and Physical Interval Note: Planned for EGD colon but patient did not tolerated bowel prep and completed only 1/4 of solution.  Stools semi-solid now.   Will proceed with EGD only to eval recent melena/hematochezia. Pt stable this am.  CBC Latest Ref Rng & Units 11/12/2018 11/11/2018 11/10/2018  WBC 4.0 - 10.5 K/uL 8.1 8.3 8.4  Hemoglobin 13.0 - 17.0 g/dL 8.5(L) 8.7(L) 8.6(L)  Hematocrit 39.0 - 52.0 % 27.9(L) 28.5(L) 26.5(L)  Platelets 150 - 400 K/uL 375 358 322   The nature of the procedure, as well as the risks, benefits, and alternatives were carefully and thoroughly reviewed with the patient. Ample time for discussion and questions allowed. The patient understood, was satisfied, and agreed to proceed.     11/12/2018 8:26 AM  Ryan Medina  has presented today for surgery, with the diagnosis of Anemia, Hematochezia  The various methods of treatment have been discussed with the patient and family. After consideration of risks, benefits and other options for treatment, the patient has consented to  Procedure(s): ESOPHAGOGASTRODUODENOSCOPY (EGD) WITH PROPOFOL (N/A) COLONOSCOPY WITH PROPOFOL (N/A) as a surgical intervention .  The patient's history has been reviewed, patient examined, no change in status, stable for surgery.  I have reviewed the patient's chart and labs.  Questions were answered to the patient's satisfaction.     Ryan Medina Ryan Medina

## 2018-11-12 NOTE — Progress Notes (Signed)
[place] for Heparin  Indication: PE, DVT  Allergies  Allergen Reactions  . Lisinopril Anaphylaxis   Patient Measurements: Height: 5\' 11"  (180.3 cm) Weight: 123 lb 14.4 oz (56.2 kg) IBW/kg (Calculated) : 75.3 Heparin Dosing Weight: 66.7  Vital Signs: Temp: 97.7 F (36.5 C) (02/21 2100) Temp Source: Oral (02/21 2100) BP: 113/77 (02/21 2100) Pulse Rate: 111 (02/21 2100)  Labs: Recent Labs    11/10/18 0559  11/11/18 0010 11/11/18 0823 11/11/18 1635 11/12/18 0334 11/12/18 2210  HGB 8.6*  --  8.7*  --   --  8.5*  --   HCT 26.5*  --  28.5*  --   --  27.9*  --   PLT 322  --  358  --   --  375  --   HEPARINUNFRC  --    < > 0.67 0.77* 0.56  --  0.28*  CREATININE 0.47*  --  0.52*  --   --  0.40*  --    < > = values in this interval not displayed.   Estimated Creatinine Clearance: 81 mL/min (A) (by C-G formula based on SCr of 0.4 mg/dL (L)).  Assessment: 58 yo male with arterial embolus s/p embolectomy. Patient had development of hematoma with compartment syndrome as well as hemorrhagic shock secondary to a bleeding duodenal ulcer.   Events this admission on 09/03/18:  LLE DVT/ischemia s/p urgent left popliteal artery embolectomy 12/13 >> RLE hematoma on 12/15 am s/p evacuation in OR.Heparin stopped 2/2 drop in hgb, hematemesis. -Heparin cautiously started 12/22,had few episodes of hematemesis & nose bleed 12/24> stopped heparin per dr. Candiss Norse S/p EUS 12/26:  pancreatic mass eroding into duodenum -VVS notes ikely too high risk for [facility]  12/28- Dr. Candiss Norse noted Continue PPI and Hold Heparin indefinitely  2/5: pharmacy consulted to dose heparin with no bolus by Dr Marin Olp for acute PE. Previously on 12/24 his heparin level was therapeutic at 0.35 on 1200 units/hr. 2/7: Heparin off at 09 am for EDG, heparin to resume after NG placed per GI instructions. KUB 1629> NG in stomach 2/13: Expl lap & Gastrojejunostomy- not able to use jejunostomy for  enteral feeds. Heparin resumed at 2000 at rate of 1600 units/hr 2/14 heparin tx to lovenox (heparin at 1700 units/hr) 2/19 lovenox to heparin in anticipation of colonoscopy 2/21 heparin held at 0300 for EGD, HL 0.56 on 1400 units/hr  11/12/2018  - H/H low, Hgb 8.7, PLT stable - no bleeding reported -2210 HL = 0.28 slightly below goal, no infusion or bleeding per RN.    Goal of Therapy:  Monitor platelets by anticoagulation protocol: Yes Heparin level 0.3-0.5 units/ml   Plan:   Increase heparin drip slightly to 1450 units/hr  Recheck HL in am  Daily CBC   Monitor for bleeding  Dorrene German 11/12/2018, 10:48 PM

## 2018-11-12 NOTE — Progress Notes (Signed)
[place] for Heparin  Indication: PE, DVT  Allergies  Allergen Reactions  . Lisinopril Anaphylaxis   Patient Measurements: Height: 5\' 11"  (180.3 cm) Weight: 123 lb 14.4 oz (56.2 kg) IBW/kg (Calculated) : 75.3 Heparin Dosing Weight: 66.7  Vital Signs: Temp: 98.7 F (37.1 C) (02/21 1329) Temp Source: Oral (02/21 1329) BP: 112/87 (02/21 1329) Pulse Rate: 103 (02/21 1329)  Labs: Recent Labs    11/10/18 0559  11/11/18 0010 11/11/18 0823 11/11/18 1635 11/12/18 0334  HGB 8.6*  --  8.7*  --   --  8.5*  HCT 26.5*  --  28.5*  --   --  27.9*  PLT 322  --  358  --   --  375  HEPARINUNFRC  --    < > 0.67 0.77* 0.56  --   CREATININE 0.47*  --  0.52*  --   --  0.40*   < > = values in this interval not displayed.   Estimated Creatinine Clearance: 81 mL/min (A) (by C-G formula based on SCr of 0.4 mg/dL (L)).  Assessment: 58 yo male with arterial embolus s/p embolectomy. Patient had development of hematoma with compartment syndrome as well as hemorrhagic shock secondary to a bleeding duodenal ulcer.   Events this admission on 09/03/18:  LLE DVT/ischemia s/p urgent left popliteal artery embolectomy 12/13 >> RLE hematoma on 12/15 am s/p evacuation in OR.Heparin stopped 2/2 drop in hgb, hematemesis. -Heparin cautiously started 12/22,had few episodes of hematemesis & nose bleed 12/24> stopped heparin per dr. Candiss Norse S/p EUS 12/26:  pancreatic mass eroding into duodenum -VVS notes ikely too high risk for [facility]  12/28- Dr. Candiss Norse noted Continue PPI and Hold Heparin indefinitely  2/5: pharmacy consulted to dose heparin with no bolus by Dr Marin Olp for acute PE. Previously on 12/24 his heparin level was therapeutic at 0.35 on 1200 units/hr. 2/7: Heparin off at 09 am for EDG, heparin to resume after NG placed per GI instructions. KUB 1629> NG in stomach 2/13: Expl lap & Gastrojejunostomy- not able to use jejunostomy for enteral feeds. Heparin resumed at 2000 at  rate of 1600 units/hr 2/14 heparin tx to lovenox (heparin at 1700 units/hr) 2/19 lovenox to heparin in anticipation of colonoscopy 2/21 heparin held at 0300 for EGD, HL 0.56 on 1400 units/hr  11/12/2018  - H/H low, Hgb 8.7, PLT stable - no bleeding reported   Goal of Therapy:  Monitor platelets by anticoagulation protocol: Yes Heparin level 0.3-0.5 units/ml   Plan:   resume Heparin drip at 1400 units/hr  Heparin level in 6 hours  Daily CBC   Monitor for bleeding  Dolly Rias RPh 11/12/2018, 2:06 PM Pager [phone]

## 2018-11-12 NOTE — Progress Notes (Signed)
PROGRESS NOTE    Ryan Medina  IRC:[id number] DOB: [birth year] DOA: 09/03/2018 PCP: Patient, No Pcp Per    Brief Narrative: 58 year old with history of peripheral vascular disease, dilated cardiomyopathy, essential hypertension, diabetes mellitus type 2 initially came to the hospital with right lower extremity pain and was found to have ischemic leg requiring embolectomy on 12/13. He was placed on heparin drip subsequently developed right lower extremity hematoma requiring fasciotomy on 12/15. Unfortunately afterwards he decompensated with active GI bleed and was intubated and was moved to the ICU. He required embolization on 12/17. During the course of his hospitalization he is required total of 25 units of blood. Further evaluation showed pancreatic cancer with duodenal ulcer. Transferred to [facility] for radiation treatment to help with bleeding. Hospital course complicated by sepsis from healthcare acquired pneumonia and Pseudomonas UTI requiring treatment with vancomycin and cefepime.Patient found to have chronic PE therefore started on heparin drip per oncology. Repeat endoscopy showed worsening of gastric outlet obstruction. Initially NG tube placed by patient removed it. Right now on clear liquid diet   Assessment & Plan:   Principal Problem:   Popliteal artery embolus (HCC) Active Problems:   HCAP (healthcare-associated pneumonia)   DM (diabetes mellitus), type 2, uncontrolled ([place])   Essential hypertension   Dilated cardiomyopathy (HCC)   Chest pain   AAA (abdominal aortic aneurysm) (HCC)   Ischemic foot   Right leg claudication (HCC)   PVD (peripheral vascular disease) ([place])   Hemorrhagic shock (HCC)   Nontraumatic ischemic infarction of muscle of right lower leg   Upper GI bleed   Cocaine abuse ([place])   Acute blood loss anemia   Chronic pain syndrome   Duodenal ulcer with hemorrhage   Pancreatic cancer ([place])   Goals of care, counseling/discussion   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  Patient was transfused a unit packed red blood cells overnight.  Hemoglobin currently at 8.5 from 8.6 from 7.0.  Questionable etiology.  Patient being followed by oncology who anticipating starting patient on chemotherapy and requesting that GI reassess patient for possible colonoscopy for further evaluation prior to initiation of chemotherapy.  Patient has been changed to IV heparin per oncology.  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  Diet advanced to soft diet per gastroenterology.  GI following and appreciate their input and recommendations.   Adenocarcinoma of pancreas Hemorrhagic shock due to GI bleed from duodenal ulcer, stable Gastric outlet obstruction causing dysphagia  and abdominal distention -CT scan showed increase in size of the mass  with involvement of intra-abdominal arteries, deemed to be inoperable. Status post radiation. Per oncology plans for outpatient radiation and chemotherapy -Status post endoscopy 12/17, GDA embolization 12/17, status post radiation. -Continue PPI. -Endoscopy showed worsening of gastric outlet obstruction/pyloric stenosis secondary to malignancy. Candida esophagitis -S/Plaparoscopy open gastrojejunostomy no pancreatic mass amenable to biopsy -PICC WAS NOT PLACED AS HE HAD A PORT .Continue TPN. -Patient on clear liquids which has been advanced to full liquids, for now in anticipation of further evaluation of rectal bleeding.  Patient underwent upper endoscopy this morning by Dr. Hilarie Fredrickson on 11/12/2018, which showed normal esophagus, no varices, small hiatal hernia, gastrojejunostomy found characterized by ulceration which was not bleeding, though likely source of recent hematochezia, gastric stenosis was found at the pylorus from known invasive adenocarcinoma.  Hemoglobin currently at 8.5.  Transfusion threshold hemoglobin less than 7.  General surgery following.  GI following.  Candida esophagitis Patient has received a full course of Diflucan.    Chronic bilateral pulmonary embolismstarted Lovenox Lovenox discontinued the morning of 11/10/2018, patient placed on heparin due to bright red blood per rectum.  Per oncology.  Anemia of chronic disease Status post multiple blood transfusions25 blood transfusions Had bloody bowel movements over the past few days initially felt secondary to constipation.  Hemoglobin was 7.  Status post 1 unit packed red blood cells.  Hemoglobin currently at 8.5.  Follow H&H.   Hypokalemia/hypomagnesemia -Potassium repleted and at 4.6.  Magnesium at 2.1.   Sepsis secondary to H CAP and Pseudomonas urinary tract infection -Status post full course of IV vancomycin IV cefepime. PICC line pulled out 1/26.  Ischemic right foot with right popliteal and tibial  artery embolus -Status post embolectomy. Complicated by compartment syndrome requiring fasciotomy.  Cardiomyopathy, combined systolic and diastolic CHF, chronic -Patient currently euvolemic.  Follow.   Uncontrolled diabetes mellitus type 2 -Hemoglobin A1c 18.2. CBG of 159 this morning.  Continue Lantus and sliding scale insulin.  Follow.   Severe protein calorie malnutrition Continue TPN.  Patient tolerating full liquids and has been advanced to soft diet.   Generalized weakness-physical therapy/Occupational Therapy-recommending patient seen by PT/OT who are recommending SNF.    Severe protein calorie malnutrition Patient on TPN.  Patient tolerating clears and subsequently full liquids and has been advanced to a soft diet per Gastroenterology.  On TNA.     DVT prophylaxis: Heparin Code Status: Full Family Communication: Updated patient, brother and sister at bedside. Disposition Plan: To be determined.  Patient homeless and likely not a suitable candidate for shelter at this time due to ongoing medical issues.   Consultants:   Gastroenterology  Oncology  Conventional radiology  Pulmonary and critical care medicine  Vascular surgery  Palliative care  General surgery  Cardiology  Procedures:  Laparoscopy, open gastrojejunostomy per Dr. Hassell Done 11/04/2018  Endoscopy 09/07/2018  EUS 09/16/2018  Chemo-port placement 10/12/2018  Repeat endoscopy 10/29/2018--per Dr. Loletha Carrow III  Repeat upper endoscopy 11/12/2018 per Dr. Hilarie Fredrickson  Antimicrobials:   IV vancomycin--finished course  IV cefepime--- finished course   Subjective: Patient laying in bed.  Return for upper endoscopy.  Denies any further bloody bowel movements.  No chest pain.  No shortness of breath.  On full liquid diet.   Objective: Vitals:   11/12/18 0908 11/12/18 0910 11/12/18 0930 11/12/18 0951  BP: 95/74 99/83 (!) 121/96 (!) 120/98  Pulse: 98 99 85 92  Resp: 15 17 (!) 21 18  Temp:    Marland Kitchen)  97.4  F (36.3 C)  TempSrc:    Oral  SpO2: 100% 100% 100% 100%  Weight:      Height:        Intake/Output Summary (Last 24 hours) at 11/12/2018 1244 Last data filed at 11/12/2018 1100 Gross per 24 hour  Intake 1731.41 ml  Output 1100 ml  Net 631.41 ml   Filed Weights   11/02/18 1806 11/04/18 0831 11/12/18 0742  Weight: 56.2 kg 56.2 kg 56.2 kg    Examination:  General exam: NAD Respiratory system: CTAB.  No wheezes, no crackles, no rhonchi.  Normal respiratory effort.   Cardiovascular system: RRR no murmurs rubs or gallops.  No JVD.  No lower extremity edema.  Gastrointestinal system: Abdomen is nontender, nondistended, soft, positive bowel sounds. Midabdominal surgical wound clean dry and intact with staples in place.  Central nervous system: Alert and oriented. No focal neurological deficits. Extremities: Right foot with dressing and dark.  Symmetric 5 x 5 power. Skin: No rashes, lesions or ulcers Psychiatry: Judgement and insight appear normal. Mood & affect appropriate.     Data Reviewed: I have personally reviewed following labs and imaging studies  CBC: Recent Labs  Lab 11/06/18 0641  11/08/18 0455 11/09/18 0338 11/10/18 0559 11/11/18 0010 11/12/18 0334  WBC 9.4   < > 6.2 6.8 8.4 8.3 8.1  NEUTROABS 8.0*  --  5.0  --   --   --   --   HGB 10.6*   < > 7.8* 7.0* 8.6* 8.7* 8.5*  HCT 33.7*   < > 25.2* 22.5* 26.5* 28.5* 27.9*  MCV 90.3   < > 91.6 88.9 89.5 91.6 92.1  PLT 318   < > 297 303 322 358 375   < > = values in this interval not displayed.   Basic Metabolic Panel: Recent Labs  Lab 11/08/18 0455 11/09/18 0338 11/10/18 0559 11/11/18 0010 11/12/18 0334  NA 135 134* 132* 132* 136  K 4.8 4.1 4.5 4.5 4.6  CL 100 99 99 98 100  CO2 27 29 26 29 29   GLUCOSE 159* 128* 152* 243* 142*  BUN 28* 17 16 16 19   CREATININE 0.49* 0.42* 0.47* 0.52* 0.40*  CALCIUM 8.0* 8.0* 8.0* 8.1* 8.3*  MG 1.7 1.9 2.0 2.0 2.1  PHOS 2.9 3.9 4.1 3.9 3.9   GFR: Estimated Creatinine  Clearance: 81 mL/min (A) (by C-G formula based on SCr of 0.4 mg/dL (L)). Liver Function Tests: Recent Labs  Lab 11/06/18 0641 11/08/18 0455 11/09/18 0338 11/10/18 0559  AST 15 21 23  50*  ALT 15 22 24  45*  ALKPHOS 59 65 64 73  BILITOT 0.4 0.3 0.2* 0.2*  PROT 6.1* 6.2* 6.1* 6.3*  ALBUMIN 1.6* 1.6* 1.7* 1.8*   No results for input(s): LIPASE, AMYLASE in the last 168 hours. No results for input(s): AMMONIA in the last 168 hours. Coagulation Profile: No results for input(s): INR, PROTIME in the last 168 hours. Cardiac Enzymes: No results for input(s): CKTOTAL, CKMB, CKMBINDEX, TROPONINI in the last 168 hours. BNP (last 3 results) No results for input(s): PROBNP in the last 8760 hours. HbA1C: No results for input(s): HGBA1C in the last 72 hours. CBG: Recent Labs  Lab 11/11/18 2026 11/12/18 0008 11/12/18 0417 11/12/18 0947 11/12/18 1121  GLUCAP 143* 103* 126* 159* 201*   Lipid Profile: No results for input(s): CHOL, HDL, LDLCALC, TRIG, CHOLHDL, LDLDIRECT in the last 72 hours. Thyroid Function Tests: No results for input(s): TSH, T4TOTAL, FREET4, T3FREE, THYROIDAB in the last  72 hours. Anemia Panel: No results for input(s): VITAMINB12, FOLATE, FERRITIN, TIBC, IRON, RETICCTPCT in the last 72 hours. Sepsis Labs: No results for input(s): PROCALCITON, LATICACIDVEN in the last 168 hours.  No results found for this or any previous visit (from the past 240 hour(s)).       Radiology Studies: Ct Angio Abd/pel W/ And/or W/o  Result Date: 11/10/2018 CLINICAL DATA:  Pancreatic carcinoma with partial duodenal obstruction, gastric varices. GI bleed since gastrojejunostomy on 11/04/18^ EXAM: CTA ABDOMEN AND PELVIS wITHOUT AND WITH CONTRAST TECHNIQUE: Multidetector CT imaging of the abdomen and pelvis was performed using the standard protocol during bolus administration of intravenous contrast. Multiplanar reconstructed images and MIPs were obtained and reviewed to evaluate the vascular  anatomy. CONTRAST:  136mL ISOVUE-370 IOPAMIDOL (ISOVUE-370) INJECTION 76% COMPARISON:  10/27/2018 FINDINGS: VASCULAR Aorta: Fusiform abdominal aortic aneurysm measured up to 3.1 cm maximum diameter, stable. No significant intraluminal thrombus. No dissection or stenosis. Celiac: Patent. The left gastric artery arises directly from the abdominal aorta, an anatomic variant. Metallic gastroduodenal embolization coils without evidence of recanalization. SMA: Patent without evidence of aneurysm, dissection, vasculitis or significant stenosis. Renals: Both renal arteries are patent without evidence of aneurysm, dissection, vasculitis, fibromuscular dysplasia or significant stenosis. IMA: Short-segment origin stenosis. Distally patent without evidence of aneurysm, dissection, vasculitis or significant stenosis. Inflow: Fusiform dilatation of the right common iliac artery up to 2.9 cm diameter. Fusiform dilatation of left common iliac artery up to 2.7 cm diameter with some eccentric nonocclusive mural thrombus. Tortuous ectatic distal iliac arterial systems without significant stenosis. Proximal Outflow: Bilateral common femoral and visualized portions of the superficial and profunda femoral arteries are patent without evidence of aneurysm, dissection, vasculitis or significant stenosis. Veins: Apparent splenic venous occlusion with collaterals from the splenic hilum extending along the anterior mesentery. Streak artifact from GDA embolization coils obscures visualization of the portosplenic confluence. Main portal vein and intrahepatic branches are patent. Hepatic veins patent. Review of the MIP images confirms the above findings. NON-VASCULAR Lower chest: Chronic atelectasis/scarring in the left lower lobe. No pleural or pericardial effusion. Hepatobiliary: No focal liver abnormality is seen. No gallstones, gallbladder wall thickening, or biliary dilatation. Pancreas: Decreased enhancement in the mid pancreatic body.  Atrophy in the pancreatic tail. Some image degradation through the pancreatic head secondary to regional embolization coils. Spleen: Normal in size without focal lesion. Adrenals/Urinary Tract: Adrenal glands are unremarkable. Kidneys are normal, without renal calculi, focal lesion, or hydronephrosis. Bladder is unremarkable. Stomach/Bowel: Stomach is nondilated. Gastrojejunostomy staple line in the lateral gastric body. No evidence of active extravasation. Small bowel is decompressed. Moderate proximal colonic and rectal fecal material without dilatation. Lymphatic: No definite abdominal or pelvic adenopathy, evaluation limited secondary to paucity of fat and diffuse anasarca. Reproductive: Prostate is unremarkable. Other: No significant ascites. Scattered bubbles of free intraperitoneal air presumably postoperative. Musculoskeletal: Midline anterior abdominal wall skin staples. Lumbosacral spondylitic change. No fracture or worrisome bone lesion. IMPRESSION: VASCULAR 1. No evidence of active extravasation. 2. 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 3. 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 NON-VASCULAR 1. No acute findings. 2. Decreased enhancement in the mid pancreatic body suggesting residual/recurrent neoplasm. Electronically Signed   By: Lucrezia Europe M.D.   On: 11/10/2018 16:46        Scheduled Meds: . acetaminophen  1,000 mg Oral Q8H  . feeding supplement (ENSURE ENLIVE)  237 mL Oral QID  .  heparin lock flush  500 Units Intracatheter Q30 days  . insulin aspart  0-20 Units Subcutaneous Q4H  . pantoprazole (PROTONIX) IV  40 mg Intravenous Q12H  . sucralfate  1 g Oral TID WC & HS   Continuous Infusions: . sodium chloride 10 mL/hr at 11/12/18 0400  . TPN ADULT (ION) 85 mL/hr at 11/12/18 0400  . TPN ADULT (ION)       LOS: 70 days    Time spent: 40 minutes    Irine Seal,  MD [facility]  If 7PM-7AM, please contact night-coverage [email or web address] 11/12/2018, 12:44 PM

## 2018-11-12 NOTE — Progress Notes (Signed)
PHARMACY - ADULT TOTAL PARENTERAL NUTRITION CONSULT NOTE   Pharmacy Consult for TPN  Indication: gastric outlet obstruction 2/2 advanced pancreatic adenocarcinoma s/p laparoscopy, open gastrojejunostomy on 2/13  Patient Measurements: Height: 5' 11"  (180.3 cm) Weight: 123 lb 14.4 oz (56.2 kg) IBW/kg (Calculated) : 75.3 TPN AdjBW (KG): 56.2 Body mass index is 17.28 kg/m.   Insulin Requirements: 3 units SSI in past 24 hours + 10 units regular insulin in TPN (levemir 4 units daily d/c)  Current Nutrition: TPN; full liquid diet   IVF: d/c 2/16  Central access: port TPN start date: 11/05/2018  ASSESSMENT                                                                                                          HPI: 53 y/oM with RLE pain/numbness, DVT and limb ischemia > urgent arteriography & popliteal embolectomy on 12/13 > placed on heparin drip, developed RLE hematoma requiring fasciotomy 12/15 > hemorrhagic shock due to GI bleed, AC stopped > heparin resumed 2/5 for PE > change to lovenox 2/14 - GOO secondary to pancreatic Ca, s/p laparoscopy, open gastrojejunostomy on 2/13, pt removed NGT in PACU -pharmacy consulted to start TPN 2/14  Significant events:   2/15: try clear liquids per CCS  2/16: advance to full liquid diet per MD  2/17: increased abdominal pain and distension overnight  2/19: rectal bleeding   Today: 11/12/18    Tolerating full liquid diet til NPO for EGD today  Glucose - CBGs 103-142 in past 24 hours (goal < 150)  Electrolytes -  All WNL   Renal - SCr stable  LFTs - Albumin low; AST/ALT, Alk Phos, Tbili WNL (2/18)  TGs - 83 (2/17)  Prealbumin - 5.1 (2/11), 5.6 (2/15), 10.8 (2/17)  NUTRITIONAL GOALS                                                                                             RD recs (2/14):  Kcal:  2000-2200 kcal Protein:  100-110 grams  Custom TPN at goal rate of 65m/hr provides: - 102 g/day protein (50 g/L) - 61.2 g/day  Lipid  (30 g/L) - 306 g/day Dextrose (15 %) -  2060 Kcal/day   PLAN  At 1800 today:  continue TPN at goal rate of 85 ml/hr. -Provides2060 kCals, 102g proteinper day,meeting100% of goal kcal and goal AA  Electrolytes in TPN:  cont Na+ at 75 mEq/L, continue Mg at 10 mEq/L,decrease K  to 60 mEq/L, decrease phos to 15 mmol/L, continue standard Ca and Phos, Cl:Ac ratio 1:1  TPN to contain standard multivitamins and trace elements.  Continue SSI resistant scale q4h. Continue 10 units regular insulin in TPN   TPN lab panels on Mondays & Thursdays.  BMET with mag and phos tomorrow  F/u to start weaning  F/u daily.  Dolly Rias RPh 11/12/2018, 9:38 AM Pager [phone]

## 2018-11-12 NOTE — Progress Notes (Signed)
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  Midline wound looks great.    Objective: Vital signs in last 24 hours: Temp:  [97.4 F (36.3 C)-99 F (37.2 C)] 97.4 F (36.3 C) (02/21 0951) Pulse Rate:  [85-116] 92 (02/21 0951) Resp:  [13-22] 18 (02/21 0951) BP: (78-125)/(57-98) 120/98 (02/21 0951) SpO2:  [98 %-100 %] 100 % (02/21 0951) Weight:  [56.2 kg] 56.2 kg (02/21 0742) Last BM Date: 11/11/18 1391 IV Nothing pO recorded Urine 1150  No BM recorded Afebrile, 1 BP was down most likely after induction for EGD Other VSS, he does stay tachycardic Labs OK EGD results below Dilaudid taking 1 mg x 6 yesterday   Intake/Output from previous day: 02/20 0701 - 02/21 0700 In: 1391.4 [I.V.:1291.4; IV Piggyback:100] Out: 1150 [Urine:1150] Intake/Output this shift: Total I/O In: 100 [I.V.:100] Out: -   General appearance: alert, cooperative and no distress Resp: clear to auscultation bilaterally GI: midline wound looks great.  Positive bowel sounds tolerating clears without issue.  No real complaints of abdominal pain.  Lab Results:  Recent Labs    11/11/18 0010 11/12/18 0334  WBC 8.3 8.1  HGB 8.7* 8.5*  HCT 28.5* 27.9*  PLT 358 375    BMET Recent Labs    11/11/18 0010 11/12/18 0334  NA 132* 136  K 4.5 4.6  CL 98 100  CO2 29 29  GLUCOSE 243* 142*  BUN 16 19  CREATININE 0.52* 0.40*  CALCIUM 8.1* 8.3*   PT/INR No results for input(s): LABPROT, INR in the last 72 hours.  Recent Labs  Lab 11/06/18 0641 11/08/18 0455 11/09/18 0338 11/10/18 0559  AST 15 21 23  50*  ALT 15 22 24  45*  ALKPHOS 59 65 64 73  BILITOT 0.4 0.3 0.2* 0.2*  PROT 6.1* 6.2* 6.1* 6.3*  ALBUMIN 1.6* 1.6* 1.7* 1.8*     Lipase     Component Value Date/Time   LIPASE 42 08/22/2018 1932     Medications: . feeding supplement (ENSURE ENLIVE)  237 mL Oral QID  . heparin lock flush  500 Units  Intracatheter Q30 days  . insulin aspart  0-20 Units Subcutaneous Q4H  . pantoprazole (PROTONIX) IV  40 mg Intravenous Q12H  EGD 11/12/18 Normal esophagus. No varices. - Small hiatal hernia. 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 - No specimens collected.  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 Chronic pain/hx of substance abuse -ETOH/Cocaine/tobacco Anemia H/H: 7.8/25.2>>7.0/22.5; being transfused 11/09/18 Hx exploratory laparotomy,closure of pyloric channel ulcer with Phillip Heal patch 12/2003 Dr. 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 Rodman Key MartinPOD# [id number]  FEN:IV fluids/TPN/ sips of clears >> clears from the floor ID: Diflucan 2/7>>2/12 (candida esophagitis) cefotetan preop 11/04/2018 DVT: SCD/Lovenox Follow up: TBD  Plan: We  will leave him on full liquids and TNA over the weekend.  His Protonix has been increased to twice daily, I have added Carafate.  Changing more  pain medications to oral and get him off the Dilaudid.  DC the Robaxin.     LOS: 70 days    Ryan Medina 11/12/2018 [phone]

## 2018-11-12 NOTE — Transfer of Care (Signed)
Immediate Anesthesia Transfer of Care Note  Patient: Ryan Medina  Procedure(s) Performed: ESOPHAGOGASTRODUODENOSCOPY (EGD) WITH PROPOFOL (N/A ) COLONOSCOPY WITH PROPOFOL (N/A )  Patient Location: Endoscopy Unit  Anesthesia Type:MAC  Level of Consciousness: drowsy  Airway & Oxygen Therapy: Patient Spontanous Breathing and Patient connected to nasal cannula oxygen  Post-op Assessment: Report given to RN and Post -op Vital signs reviewed and stable  Post vital signs: Reviewed  Last Vitals:  Vitals Value Taken Time  BP 78/57 11/12/2018  8:54 AM  Temp    Pulse 96 11/12/2018  8:55 AM  Resp 17 11/12/2018  8:55 AM  SpO2 100 % 11/12/2018  8:55 AM  Vitals shown include unvalidated device data.  Last Pain:  Vitals:   11/12/18 0742  TempSrc: Oral  PainSc: 7       Patients Stated Pain Goal: 0 (95/28/41 3244)  Complications: No apparent anesthesia complications

## 2018-11-13 DIAGNOSIS — K283 Acute gastrojejunal ulcer without hemorrhage or perforation: Secondary | ICD-10-CM

## 2018-11-13 LAB — GLUCOSE, CAPILLARY
GLUCOSE-CAPILLARY: 133 mg/dL — AB (ref 70–99)
GLUCOSE-CAPILLARY: 196 mg/dL — AB (ref 70–99)
Glucose-Capillary: 226 mg/dL — ABNORMAL HIGH (ref 70–99)
Glucose-Capillary: 62 mg/dL — ABNORMAL LOW (ref 70–99)
Glucose-Capillary: 161 mg/dL — ABNORMAL HIGH (ref 70–99)
Glucose-Capillary: 202 mg/dL — ABNORMAL HIGH (ref 70–99)

## 2018-11-13 LAB — BASIC METABOLIC PANEL
Anion gap: 3 — ABNORMAL LOW (ref 5–15)
BUN: 16 mg/dL (ref 6–20)
CO2: 28 mmol/L (ref 22–32)
CREATININE: 0.38 mg/dL — AB (ref 0.61–1.24)
Calcium: 8 mg/dL — ABNORMAL LOW (ref 8.9–10.3)
Chloride: 103 mmol/L (ref 98–111)
GFR calc non Af Amer: >60 mL/min (ref >60)
Glucose, Bld: 75 mg/dL (ref 70–99)
Potassium: 4.3 mmol/L (ref 3.5–5.1)
Sodium: 134 mmol/L — ABNORMAL LOW (ref 135–145)

## 2018-11-13 LAB — CBC
HCT: 26.1 % — ABNORMAL LOW (ref 39.0–52.0)
Hemoglobin: 8 g/dL — ABNORMAL LOW (ref 13.0–17.0)
MCH: 28.8 pg (ref 26.0–34.0)
MCHC: 30.7 g/dL (ref 30.0–36.0)
MCV: 93.9 fL (ref 80.0–100.0)
Platelets: 341 10*3/uL (ref 150–400)
RBC: 2.78 MIL/uL — ABNORMAL LOW (ref 4.22–5.81)
RDW: 15.7 % — ABNORMAL HIGH (ref 11.5–15.5)
WBC: 7.4 10*3/uL (ref 4.0–10.5)
nRBC: 0 % (ref 0.0–0.2)

## 2018-11-13 LAB — HEPARIN LEVEL (UNFRACTIONATED)
Heparin Unfractionated: 0.32 IU/mL (ref 0.30–0.70)
Heparin Unfractionated: 0.46 IU/mL (ref 0.30–0.70)
Heparin Unfractionated: 0.32 IU/mL (ref 0.30–0.70)

## 2018-11-13 LAB — MAGNESIUM: Magnesium: 1.8 mg/dL (ref 1.7–2.4)

## 2018-11-13 LAB — PHOSPHORUS: PHOSPHORUS: 3.1 mg/dL (ref 2.5–4.6)

## 2018-11-13 MED ORDER — TRAVASOL 10 % IV SOLN
INTRAVENOUS | Status: DC
  Administered 2018-11-13: 18:00:00 via INTRAVENOUS
  Filled 2018-11-13: qty 1020

## 2018-11-13 MED ORDER — INSULIN ASPART 100 UNIT/ML ~~LOC~~ SOLN
0.0000 [IU] | Freq: Three times a day (TID) | SUBCUTANEOUS | Status: DC
  Administered 2018-11-13: 7 [IU] via SUBCUTANEOUS
  Administered 2018-11-13 – 2018-11-14 (×3): 4 [IU] via SUBCUTANEOUS

## 2018-11-13 NOTE — Progress Notes (Signed)
PROGRESS NOTE    Ryan Medina  DTO:[id number] DOB: [birth year] DOA: 09/03/2018 PCP: Patient, No Pcp Per    Brief Narrative: 58 year old with history of peripheral vascular disease, dilated cardiomyopathy, essential hypertension, diabetes mellitus type 2 initially came to the hospital with right lower extremity pain and was found to have ischemic leg requiring embolectomy on 12/13. He was placed on heparin drip subsequently developed right lower extremity hematoma requiring fasciotomy on 12/15. Unfortunately afterwards he decompensated with active GI bleed and was intubated and was moved to the ICU. He required embolization on 12/17. During the course of his hospitalization he is required total of 25 units of blood. Further evaluation showed pancreatic cancer with duodenal ulcer. Transferred to [facility] for radiation treatment to help with bleeding. Hospital course complicated by sepsis from healthcare acquired pneumonia and Pseudomonas UTI requiring treatment with vancomycin and cefepime.Patient found to have chronic PE therefore started on heparin drip per oncology. Repeat endoscopy showed worsening of gastric outlet obstruction. Initially NG tube placed by patient removed it. Right now on clear liquid diet   Assessment & Plan:   Principal Problem:   Popliteal artery embolus (HCC) Active Problems:   HCAP (healthcare-associated pneumonia)   DM (diabetes mellitus), type 2, uncontrolled ([place])   Essential hypertension   Dilated cardiomyopathy (HCC)   Chest pain   AAA (abdominal aortic aneurysm) (HCC)   Ischemic foot   Right leg claudication (HCC)   PVD (peripheral vascular disease) ([place])   Hemorrhagic shock (HCC)   Nontraumatic ischemic infarction of muscle of right lower leg   Upper GI bleed   Cocaine abuse ([place])   Acute blood loss anemia   Chronic pain syndrome   Duodenal ulcer with hemorrhage   Pancreatic cancer ([place])   Goals of care, counseling/discussion   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  Patient was transfused a unit packed red blood cells overnight.  Hemoglobin currently at 8.5 from 8.6 from 7.0.  Questionable etiology.  Patient being followed by oncology who anticipating starting patient on chemotherapy and requesting that GI reassess patient for possible colonoscopy for further evaluation prior to initiation of chemotherapy.  Patient has been changed to IV heparin per oncology.  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  Diet advanced to soft diet per gastroenterology.  GI following and appreciate their input and recommendations.   Adenocarcinoma of pancreas Hemorrhagic shock due to GI bleed from duodenal ulcer, stable Gastric outlet obstruction causing dysphagia  and abdominal distention -CT scan showed increase in size of the mass  with involvement of intra-abdominal arteries, deemed to be inoperable. Status post radiation. Per oncology plans for outpatient radiation and chemotherapy -Status post endoscopy 12/17, GDA embolization 12/17, status post radiation. -Continue PPI. -Endoscopy showed worsening of gastric outlet obstruction/pyloric stenosis secondary to malignancy. Candida esophagitis -S/Plaparoscopy open gastrojejunostomy no pancreatic mass amenable to biopsy -PICC WAS NOT PLACED AS HE HAD A PORT .Continue TPN. -Patient on clear liquids which has been advanced to full liquids, for now in anticipation of further evaluation of rectal bleeding.  Patient underwent upper endoscopy this morning by Dr. Hilarie Fredrickson on 11/12/2018, which showed normal esophagus, no varices, small hiatal hernia, gastrojejunostomy found characterized by ulceration which was not bleeding, though likely source of recent hematochezia, gastric stenosis was found at the pylorus from known invasive adenocarcinoma.  Hemoglobin currently at 8.0.  Transfusion threshold hemoglobin less than 7.  General surgery following.  GI following.  Candida esophagitis Status post full course of Diflucan.     Chronic bilateral pulmonary embolismstarted Lovenox Lovenox discontinued the morning of 11/10/2018, patient placed on heparin due to bright red blood per rectum.  Per oncology.  Anemia of chronic disease Status post multiple blood transfusions25UNITS blood transfusions Had bloody bowel movements over the past few days initially felt secondary to constipation.  Hemoglobin was 7.  Status post 1 unit packed red blood cells.  Hemoglobin currently at 8.0.  Follow H&H.   Hypokalemia/hypomagnesemia -Potassium repleted and at 4.3.  Magnesium at 1.8.   Sepsis secondary to H CAP and Pseudomonas urinary tract infection -Status post full course of IV vancomycin IV cefepime. PICC line pulled out 1/26.  Ischemic right foot with right popliteal and tibial  artery embolus -Status post embolectomy. Complicated by compartment syndrome requiring fasciotomy.  Cardiomyopathy, combined systolic and diastolic CHF, chronic -Patient currently euvolemic.  Follow.   Uncontrolled diabetes mellitus type 2 -Hemoglobin A1c 18.2. CBG of 62 this morning.  Continue Lantus and sliding scale insulin.  Follow.   Severe protein calorie malnutrition Continue TPN.  Patient diet was advanced to a soft diet on 11/12/2018 which he seems to be tolerating.  If continued toleration of diet could likely start weaning TPN.   Generalized weakness-physical therapy/Occupational Therapy-recommending patient seen by PT/OT who are recommending SNF.    Severe protein calorie malnutrition Patient on TPN.  Patient diet has been advanced to a soft diet per gastroenterology after upper endoscopy which patient is tolerating.  We will continue current diet for now.  On TNA.    DVT prophylaxis: Heparin Code Status: Full Family Communication: Updated patient.  No family at bedside. Disposition Plan: To be determined.  Patient homeless and likely not a suitable candidate for shelter at this time due to ongoing medical issues.   Consultants:   Gastroenterology  Oncology  Conventional radiology  Pulmonary and critical care medicine  Vascular surgery  Palliative care  General surgery  Cardiology  Procedures:  Laparoscopy, open gastrojejunostomy per Dr. Hassell Done 11/04/2018  Endoscopy 09/07/2018  EUS 09/16/2018  Chemo-port placement 10/12/2018  Repeat endoscopy 10/29/2018--per Dr. Loletha Carrow III  Repeat upper endoscopy 11/12/2018 per Dr. Hilarie Fredrickson  Antimicrobials:   IV vancomycin--finished course  IV cefepime--- finished course   Subjective: Patient got up at the bottom of the bed.  Denies any chest pain or shortness of breath.  Seems to have eating some eggs this morning.  Denies any bloody bowel movements.   Objective: Vitals:   11/12/18 1329 11/12/18 2100  11/13/18 0136 11/13/18 0450  BP: 112/87 113/77 109/80 119/88  Pulse: (!) 103 (!) 111 95   Resp: 18 18 18 18   Temp: 98.7 F (37.1 C) 97.7 F (36.5 C) 98 F (36.7 C) 98 F (36.7 C)  TempSrc: Oral Oral Oral Oral  SpO2: 97% 100% 100% 100%  Weight:      Height:        Intake/Output Summary (Last 24 hours) at 11/13/2018 1138 Last data filed at 11/13/2018 1009 Gross per 24 hour  Intake 1355.04 ml  Output 1250 ml  Net 105.04 ml   Filed Weights   11/02/18 1806 11/04/18 0831 11/12/18 0742  Weight: 56.2 kg 56.2 kg 56.2 kg    Examination:  General exam: NAD Respiratory system: Lungs clear to auscultation bilaterally.  No wheezes, no crackles, no rhonchi.  Normal respiratory effort.  Cardiovascular system: Regular rate rhythm no murmurs rubs or gallops.  No JVD.  No lower extremity edema.  Gastrointestinal system: Abdomen is soft, nontender, nondistended, positive bowel sounds.  Midabdominal surgical wound clean dry intact staples in place.  Central nervous system: Alert and oriented. No focal neurological deficits. Extremities: Right foot with dressing and dark.  Symmetric 5 x 5 power. Skin: No rashes, lesions or ulcers Psychiatry: Judgement and insight appear normal. Mood & affect appropriate.     Data Reviewed: I have personally reviewed following labs and imaging studies  CBC: Recent Labs  Lab 11/08/18 0455 11/09/18 0338 11/10/18 0559 11/11/18 0010 11/12/18 0334 11/13/18 0800  WBC 6.2 6.8 8.4 8.3 8.1 7.4  NEUTROABS 5.0  --   --   --   --   --   HGB 7.8* 7.0* 8.6* 8.7* 8.5* 8.0*  HCT 25.2* 22.5* 26.5* 28.5* 27.9* 26.1*  MCV 91.6 88.9 89.5 91.6 92.1 93.9  PLT 297 303 322 358 375 397   Basic Metabolic Panel: Recent Labs  Lab 11/09/18 0338 11/10/18 0559 11/11/18 0010 11/12/18 0334 11/13/18 0800  NA 134* 132* 132* 136 134*  K 4.1 4.5 4.5 4.6 4.3  CL 99 99 98 100 103  CO2 29 26 29 29 28   GLUCOSE 128* 152* 243* 142* 75  BUN 17 16 16 19 16   CREATININE 0.42* 0.47*  0.52* 0.40* 0.38*  CALCIUM 8.0* 8.0* 8.1* 8.3* 8.0*  MG 1.9 2.0 2.0 2.1 1.8  PHOS 3.9 4.1 3.9 3.9 3.1   GFR: Estimated Creatinine Clearance: 81 mL/min (A) (by C-G formula based on SCr of 0.38 mg/dL (L)). Liver Function Tests: Recent Labs  Lab 11/08/18 0455 11/09/18 0338 11/10/18 0559  AST 21 23 50*  ALT 22 24 45*  ALKPHOS 65 64 73  BILITOT 0.3 0.2* 0.2*  PROT 6.2* 6.1* 6.3*  ALBUMIN 1.6* 1.7* 1.8*   No results for input(s): LIPASE, AMYLASE in the last 168 hours. No results for input(s): AMMONIA in the last 168 hours. Coagulation Profile: No results for input(s): INR, PROTIME in the last 168 hours. Cardiac Enzymes: No results for input(s): CKTOTAL, CKMB, CKMBINDEX, TROPONINI in the last 168 hours. BNP (last 3 results) No results for input(s): PROBNP in the last 8760 hours. HbA1C: No results for input(s): HGBA1C in the last 72 hours. CBG: Recent Labs  Lab 11/12/18 1538 11/12/18 2102 11/13/18 0032 11/13/18 0407 11/13/18 0726  GLUCAP 154* 229* 133* 226* 62*   Lipid Profile: No results for input(s): CHOL, HDL, LDLCALC, TRIG, CHOLHDL, LDLDIRECT in the last 72 hours. Thyroid Function Tests: No results for input(s): TSH, T4TOTAL, FREET4, T3FREE, THYROIDAB in the last  72 hours. Anemia Panel: No results for input(s): VITAMINB12, FOLATE, FERRITIN, TIBC, IRON, RETICCTPCT in the last 72 hours. Sepsis Labs: No results for input(s): PROCALCITON, LATICACIDVEN in the last 168 hours.  No results found for this or any previous visit (from the past 240 hour(s)).       Radiology Studies: No results found.      Scheduled Meds: . acetaminophen  1,000 mg Oral Q8H  . feeding supplement (ENSURE ENLIVE)  237 mL Oral QID  . heparin lock flush  500 Units Intracatheter Q30 days  . insulin aspart  0-20 Units Subcutaneous TID AC & HS  . pantoprazole (PROTONIX) IV  40 mg Intravenous Q12H  . sucralfate  1 g Oral TID WC & HS   Continuous Infusions: . sodium chloride 10 mL/hr at  11/12/18 0400  . heparin 1,450 Units/hr (11/13/18 1057)  . TPN ADULT (ION) 85 mL/hr at 11/13/18 0400  . TPN ADULT (ION)       LOS: 71 days    Time spent: 40 minutes    Irine Seal, MD [facility]  If 7PM-7AM, please contact night-coverage [email or web address] 11/13/2018, 11:38 AM

## 2018-11-13 NOTE — Progress Notes (Signed)
[place] for Heparin  Indication: PE, DVT  11/13/2018   Heparin level 0.46, remains therapeutic but increased on heparin IV 1450 units/hr.  No reported bleeding   Goal of Therapy:  Monitor platelets by anticoagulation protocol: Yes Heparin level 0.3-0.5 units/ml   Plan:   Continue IV heparin drip at current rate of 1450 units/hr  Repeat heparin level in 6 hours as confirmatory level, aiming for lower goal of 0.3-0.5 d/t recent GIB.  Daily CBC   Monitor for bleeding  Gretta Arab PharmD, BCPS Pager [phone] 11/13/2018 2:27 PM

## 2018-11-13 NOTE — Progress Notes (Signed)
[place] for Heparin  Indication: PE, DVT  11/13/2018   Heparin level 0.32, remains therapeutic on heparin IV 1450 units/hr.  No reported bleeding   Goal of Therapy:  Monitor platelets by anticoagulation protocol: Yes Heparin level 0.3-0.5 units/ml   Plan:   Continue IV heparin drip at current rate of 1450 units/hr  Daily HL,  aiming for lower goal of 0.3-0.5 d/t recent GIB.  Daily CBC   Monitor for bleeding   Dorrene German 11/13/2018 10:15 PM

## 2018-11-13 NOTE — Progress Notes (Signed)
PHARMACY - ADULT TOTAL PARENTERAL NUTRITION CONSULT NOTE   Pharmacy Consult for TPN  Indication: gastric outlet obstruction 2/2 advanced pancreatic adenocarcinoma s/p laparoscopy, open gastrojejunostomy on 2/13  Patient Measurements: Height: 5' 11"  (180.3 cm) Weight: 123 lb 14.4 oz (56.2 kg) IBW/kg (Calculated) : 75.3 TPN AdjBW (KG): 56.2 Body mass index is 17.28 kg/m.   Insulin Requirements: 28 units SSI in past 24 hours + 10 units regular insulin in TPN (levemir 4 units daily d/c)  Current Nutrition: TPN; full liquid diet   IVF: d/c 2/16  Central access: port TPN start date: 11/05/2018  ASSESSMENT                                                                                                          HPI: 35 y/oM with RLE pain/numbness, DVT and limb ischemia > urgent arteriography & popliteal embolectomy on 12/13 > placed on heparin drip, developed RLE hematoma requiring fasciotomy 12/15 > hemorrhagic shock due to GI bleed, AC stopped > heparin resumed 2/5 for PE > change to lovenox 2/14 - GOO secondary to pancreatic Ca, s/p laparoscopy, open gastrojejunostomy on 2/13, pt removed NGT in PACU -pharmacy consulted to start TPN 2/14  Significant events:   2/15: try clear liquids per [facility]  2/16: advance to full liquid diet per MD  2/17: increased abdominal pain and distension overnight  2/19: rectal bleeding  2/22: per [facility] notes, plan to keep on full liquids and TPN over weekend   Today: 11/13/18:  Tolerating full liquid diet til NPO for EGD today  Glucose - CBGs jumping all over place the last 24 - 159, 201, 154, 229, 133, 226 and 62 but patient also tolerating FL diet well per charting (80-100% meals)  Electrolytes -  All WNL except Na slightly low at 134  Renal - SCr stable  LFTs - Albumin low; AST/ALT, Alk Phos, Tbili WNL (2/18)  TGs - 83 (2/17)  Prealbumin - 5.1 (2/11), 5.6 (2/15), 10.8 (2/17)  NUTRITIONAL GOALS                                                                                              RD recs (2/14):  Kcal:  2000-2200 kcal Protein:  100-110 grams  Custom TPN at goal rate of 80m/hr provides: - 102 g/day protein (50 g/L) - 61.2 g/day Lipid  (30 g/L) - 306 g/day Dextrose (15 %) - 2060 Kcal/day   PLAN  At 1800 today:  Continue TPN at goal rate of 85 ml/hr. -Provides2060 kCals, 102g proteinper day,meeting100% of goal kcal and goal AA  Electrolytes in TPN:  cont Na+ at 75 mEq/L, continue Mg at 10 mEq/L,decrease K  to 60 mEq/L, decrease phos to 15 mmol/L, continue standard Ca and Phos, Cl:Ac ratio 1:1  TPN to contain standard multivitamins and trace elements.  Continue SSI resistant scale but will change CBGs form q4h to ACHS. Continue 10 units regular insulin in TPN   TPN lab panels on Mondays & Thursdays.  BMET with mag and phos tomorrow  F/u to start weaning   Adrian Saran, PharmD, BCPS 11/13/2018 9:01 AM

## 2018-11-13 NOTE — Progress Notes (Signed)
Hypoglycemic Event  CBG: 62  Treatment: 4 oz juice/soda  Symptoms: None  Follow-up CBG: Time:0800  CBG Result:75  Possible Reasons for Event: Unknown  Comments/MD notified:    Nancy Marus [facility]

## 2018-11-13 NOTE — Progress Notes (Signed)
1 Day Post-Op   Subjective/Chief Complaint: No complaints. Tolerating diet. Feels good   Objective: Vital signs in last 24 hours: Temp:  [97.4 F (36.3 C)-98.7 F (37.1 C)] 98 F (36.7 C) (02/22 0450) Pulse Rate:  [85-111] 95 (02/22 0136) Resp:  [14-21] 18 (02/22 0450) BP: (78-121)/(57-98) 119/88 (02/22 0450) SpO2:  [97 %-100 %] 100 % (02/22 0450) Last BM Date: 11/12/18  Intake/Output from previous day: 02/21 0701 - 02/22 0700 In: 4166 [P.O.:460; I.V.:1235] Out: 1350 [Urine:1350] Intake/Output this shift: No intake/output data recorded.  General appearance: alert and cooperative Resp: clear to auscultation bilaterally Cardio: regular rate and rhythm GI: soft, nontender. incision looks good. good bs  Lab Results:  Recent Labs    11/12/18 0334 11/13/18 0800  WBC 8.1 7.4  HGB 8.5* 8.0*  HCT 27.9* 26.1*  PLT 375 341   BMET Recent Labs    11/11/18 0010 11/12/18 0334  NA 132* 136  K 4.5 4.6  CL 98 100  CO2 29 29  GLUCOSE 243* 142*  BUN 16 19  CREATININE 0.52* 0.40*  CALCIUM 8.1* 8.3*   PT/INR No results for input(s): LABPROT, INR in the last 72 hours. ABG No results for input(s): PHART, HCO3 in the last 72 hours.  Invalid input(s): PCO2, PO2  Studies/Results: No results found.  Anti-infectives: Anti-infectives (From admission, onward)   Start     Dose/Rate Route Frequency Ordered Stop   11/05/18 0930  cefoTEtan (CEFOTAN) 2 g in sodium chloride 0.9 % 100 mL IVPB  Status:  Discontinued     2 g 200 mL/hr over 30 Minutes Intravenous  Once 11/04/18 1237 11/04/18 1323   11/04/18 2130  cefoTEtan (CEFOTAN) 2 g in sodium chloride 0.9 % 100 mL IVPB     2 g 200 mL/hr over 30 Minutes Intravenous  Once 11/04/18 1323 11/04/18 2128   11/04/18 0815  cefoTEtan (CEFOTAN) 2 g in sodium chloride 0.9 % 100 mL IVPB     2 g 200 mL/hr over 30 Minutes Intravenous On call to O.R. 11/03/18 1135 11/04/18 0931   11/01/18 1000  fluconazole (DIFLUCAN) tablet 100 mg  Status:   Discontinued     100 mg Oral Daily 11/01/18 0828 11/04/18 1237   10/29/18 1600  fluconazole (DIFLUCAN) IVPB 100 mg  Status:  Discontinued     100 mg 50 mL/hr over 60 Minutes Intravenous Every 24 hours 10/29/18 1529 11/01/18 0828   10/11/18 2300  vancomycin (VANCOCIN) IVPB 750 mg/150 ml premix  Status:  Discontinued     750 mg 150 mL/hr over 60 Minutes Intravenous Every 12 hours 10/11/18 1037 10/13/18 1954   10/11/18 1500  ceFAZolin (ANCEF) IVPB 2g/100 mL premix     2 g 200 mL/hr over 30 Minutes Intravenous To Radiology 10/08/18 1348 10/12/18 1500   10/11/18 1200  ceFEPIme (MAXIPIME) 1 g in sodium chloride 0.9 % 100 mL IVPB  Status:  Discontinued     1 g 200 mL/hr over 30 Minutes Intravenous Every 8 hours 10/11/18 1037 10/13/18 1954   10/11/18 1045  vancomycin (VANCOCIN) 1,250 mg in sodium chloride 0.9 % 250 mL IVPB     1,250 mg 166.7 mL/hr over 90 Minutes Intravenous  Once 10/11/18 1037 10/11/18 2057   10/07/18 1600  ceFEPIme (MAXIPIME) 1 g in sodium chloride 0.9 % 100 mL IVPB     1 g 200 mL/hr over 30 Minutes Intravenous Every 8 hours 10/07/18 1452 10/08/18 0105   10/06/18 1000  fluconazole (DIFLUCAN) tablet 100 mg  Status:  Discontinued     100 mg Oral Daily 10/06/18 0709 10/11/18 0834   10/02/18 2100  vancomycin (VANCOCIN) IVPB 750 mg/150 ml premix  Status:  Discontinued     750 mg 150 mL/hr over 60 Minutes Intravenous Every 12 hours 10/02/18 0812 10/04/18 1420   10/02/18 0900  vancomycin (VANCOCIN) 1,250 mg in sodium chloride 0.9 % 250 mL IVPB     1,250 mg 166.7 mL/hr over 90 Minutes Intravenous  Once 10/02/18 0812 10/02/18 1132   10/02/18 0900  ceFEPIme (MAXIPIME) 1 g in sodium chloride 0.9 % 100 mL IVPB  Status:  Discontinued     1 g 200 mL/hr over 30 Minutes Intravenous Every 8 hours 10/02/18 0812 10/07/18 0925   10/02/18 0815  ceFEPIme (MAXIPIME) 1 g in sodium chloride 0.9 % 100 mL IVPB  Status:  Discontinued     1 g 200 mL/hr over 30 Minutes Intravenous Every 8 hours  10/02/18 0800 10/02/18 0812   10/02/18 0800  ceFEPIme (MAXIPIME) 2 g in sodium chloride 0.9 % 100 mL IVPB  Status:  Discontinued     2 g 200 mL/hr over 30 Minutes Intravenous  Once 10/02/18 0757 10/02/18 0800   10/02/18 0800  metroNIDAZOLE (FLAGYL) IVPB 500 mg  Status:  Discontinued     500 mg 100 mL/hr over 60 Minutes Intravenous Every 8 hours 10/02/18 0757 10/02/18 0800   10/02/18 0800  vancomycin (VANCOCIN) IVPB 1000 mg/200 mL premix  Status:  Discontinued     1,000 mg 200 mL/hr over 60 Minutes Intravenous  Once 10/02/18 0757 10/02/18 0800   09/27/18 0000  ceFAZolin (ANCEF) IVPB 2g/100 mL premix     2 g 200 mL/hr over 30 Minutes Intravenous To Radiology 09/25/18 0902 09/28/18 0000   09/10/18 2130  ceFAZolin (ANCEF) IVPB 2g/100 mL premix     2 g 200 mL/hr over 30 Minutes Intravenous Every 8 hours 09/10/18 1634 09/11/18 0700   09/10/18 0800  ceFAZolin (ANCEF) IVPB 1 g/50 mL premix    Note to Pharmacy:  Send with pt to OR   1 g 100 mL/hr over 30 Minutes Intravenous On call 09/09/18 0756 09/10/18 1407   09/07/18 1300  erythromycin 250 mg in sodium chloride 0.9 % 100 mL IVPB     250 mg 100 mL/hr over 60 Minutes Intravenous Once 09/07/18 1125 09/07/18 2351   09/06/18 0000  ceFAZolin (ANCEF) IVPB 1 g/50 mL premix    Note to Pharmacy:  Send with pt to OR   1 g 100 mL/hr over 30 Minutes Intravenous On call 09/05/18 0849 09/05/18 0952   09/03/18 2245  ceFAZolin (ANCEF) IVPB 2g/100 mL premix     2 g 200 mL/hr over 30 Minutes Intravenous Every 8 hours 09/03/18 2236 09/04/18 1444   09/03/18 1615  ceFAZolin (ANCEF) IVPB 2g/100 mL premix  Status:  Discontinued     2 g 200 mL/hr over 30 Minutes Intravenous On call to O.R. 09/03/18 1600 09/03/18 2035      Assessment/Plan: s/p Procedure(s): ESOPHAGOGASTRODUODENOSCOPY (EGD) WITH PROPOFOL (N/A) COLONOSCOPY WITH PROPOFOL (N/A) Advance diet. 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 Chronic pain/hx of substance abuse -ETOH/Cocaine/tobacco Anemia H/H: 7.8/25.2>>7.0/22.5; being transfused 11/09/18 Hx exploratory laparotomy,closure of pyloric channel ulcer with Phillip Heal patch 12/2003 Dr. 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 Tama High  FEN:IV fluids/TPN/ sips of clears >> clears from the floor ID: Diflucan 2/7>>2/12 (candida esophagitis) cefotetan preop 11/04/2018 DVT: SCD/Lovenox Follow up: TBD  Plan: We will leave him on full liquids and TNA over the weekend.  His Protonix has been increased to twice daily, I have added Carafate.  Changing more pain medications to oral and get him off the Dilaudid.  DC the Robaxin.   LOS: 71 days    Ryan Medina 11/13/2018

## 2018-11-13 NOTE — Progress Notes (Signed)
[place] for Heparin  Indication: PE, DVT  Allergies  Allergen Reactions  . Lisinopril Anaphylaxis   Patient Measurements: Height: 5\' 11"  (180.3 cm) Weight: 123 lb 14.4 oz (56.2 kg) IBW/kg (Calculated) : 75.3 Heparin Dosing Weight: 66.7  Vital Signs: Temp: 98 F (36.7 C) (02/22 0450) Temp Source: Oral (02/22 0450) BP: 119/88 (02/22 0450) Pulse Rate: 95 (02/22 0136)  Labs: Recent Labs    11/11/18 0010  11/11/18 1635 11/12/18 0334 11/12/18 2210 11/13/18 0800  HGB 8.7*  --   --  8.5*  --  8.0*  HCT 28.5*  --   --  27.9*  --  26.1*  PLT 358  --   --  375  --  341  HEPARINUNFRC 0.67   < > 0.56  --  0.28* 0.32  CREATININE 0.52*  --   --  0.40*  --  0.38*   < > = values in this interval not displayed.   Estimated Creatinine Clearance: 81 mL/min (A) (by C-G formula based on SCr of 0.38 mg/dL (L)).  Assessment: 58 yo male with arterial embolus s/p embolectomy. Patient had development of hematoma with compartment syndrome as well as hemorrhagic shock secondary to a bleeding duodenal ulcer.   Events this admission on 09/03/18:  LLE DVT/ischemia s/p urgent left popliteal artery embolectomy 12/13 >> RLE hematoma on 12/15 am s/p evacuation in OR.Heparin stopped 2/2 drop in hgb, hematemesis. -Heparin cautiously started 12/22,had few episodes of hematemesis & nose bleed 12/24> stopped heparin per dr. Candiss Norse S/p EUS 12/26:  pancreatic mass eroding into duodenum -VVS notes ikely too high risk for [facility]  12/28- Dr. Candiss Norse noted Continue PPI and Hold Heparin indefinitely  2/5: pharmacy consulted to dose heparin with no bolus by Dr Marin Olp for acute PE. Previously on 12/24 his heparin level was therapeutic at 0.35 on 1200 units/hr. 2/7: Heparin off at 09 am for EDG, heparin to resume after NG placed per GI instructions. KUB 1629> NG in stomach 2/13: Expl lap & Gastrojejunostomy- not able to use jejunostomy for enteral feeds. Heparin resumed at 2000 at  rate of 1600 units/hr 2/14 heparin tx to lovenox (heparin at 1700 units/hr) 2/19 lovenox to heparin in anticipation of colonoscopy 2/21 heparin held at 0300 for EGD, HL 0.56 on 1400 units/hr  11/13/2018   Heparin level therapeutic this AM after heparin IV rate increased from 1400 units/hr to 1450 units/hr late last night  CBC 8, down slightly. Plts stable  No reported bleeding   Goal of Therapy:  Monitor platelets by anticoagulation protocol: Yes Heparin level 0.3-0.5 units/ml   Plan:   Continue IV heparin drip at current rate of 1450 units/hr  Repeat heparin level in 6 hours as confirmatory level  Daily CBC   Monitor for bleeding   Adrian Saran, PharmD, BCPS Pager [phone] 11/13/2018 9:16 AM

## 2018-11-13 NOTE — Progress Notes (Signed)
    Progress Note   Subjective  Tolerating diet. No bleeding reported.    Objective  Vital signs in last 24 hours: Temp:  [97.4 F (36.3 C)-98.7 F (37.1 C)] 98 F (36.7 C) (02/22 0450) Pulse Rate:  [85-111] 95 (02/22 0136) Resp:  [18-21] 18 (02/22 0450) BP: (109-121)/(77-98) 119/88 (02/22 0450) SpO2:  [97 %-100 %] 100 % (02/22 0450) Last BM Date: 11/12/18  General: Alert, well-developed, thin, in NAD Heart:  Regular rate and rhythm; no murmurs Chest: Clear to ascultation bilaterally Abdomen:  Soft, nontender and nondistended. Healing incision. Normal bowel sounds, without guarding, and without rebound.   Extremities:  Without edema. Neurologic:  Alert and  oriented x4; grossly normal neurologically. Psych:  Alert and cooperative. Normal mood and affect.  Intake/Output from previous day: 02/21 0701 - 02/22 0700 In: 1695 [P.O.:460; I.V.:1235] Out: 1350 [Urine:1350] Intake/Output this shift: No intake/output data recorded.  Lab Results: Recent Labs    11/11/18 0010 11/12/18 0334 11/13/18 0800  WBC 8.3 8.1 7.4  HGB 8.7* 8.5* 8.0*  HCT 28.5* 27.9* 26.1*  PLT 358 375 341   BMET Recent Labs    11/11/18 0010 11/12/18 0334 11/13/18 0800  NA 132* 136 134*  K 4.5 4.6 4.3  CL 98 100 103  CO2 29 29 28   GLUCOSE 243* 142* 75  BUN 16 19 16   CREATININE 0.52* 0.40* 0.38*  CALCIUM 8.1* 8.3* 8.0*   LFT No results for input(s): PROT, ALBUMIN, AST, ALT, ALKPHOS, BILITOT, BILIDIR, IBILI in the last 72 hours. PT/INR No results for input(s): LABPROT, INR in the last 72 hours. Hepatitis Panel No results for input(s): HEPBSAG, HCVAB, HEPAIGM, HEPBIGM in the last 72 hours.  Studies/Results: No results found.    Assessment & Recommendations   1.  Ulcer at gastrojejunostomy is likely source of recent bleed. BID IV PPI and change to PPI PO BID in 2 days. Will need liquid PPI or capsule PPI which can be opened and sprinkled on applesauce for administration (pantoprazole is a  tablet and absorption will be impaired given gastrojejunostomy). No plans for colonoscopy at this time. Full liquid diet for now.   2. Anemia. Hgb is 8.0 today. Closely monitor Hgb, transfuse when needed.  3. Anticoagulation. OK to resume as indicated understanding this will increase his risk of recurrent bleeding from his marginal ulcer and his invasive pancreatic cancer with ulcer Primary service to weigh risks and benefits.   GI signing off, available if needed.      LOS: 60 days   Eulamae Greenstein T. Fuller Plan MD  11/13/2018, 9:11 AM

## 2018-11-14 DIAGNOSIS — I96 Gangrene, not elsewhere classified: Secondary | ICD-10-CM

## 2018-11-14 LAB — CBC
HCT: 27.1 % — ABNORMAL LOW (ref 39.0–52.0)
Hemoglobin: 8.4 g/dL — ABNORMAL LOW (ref 13.0–17.0)
MCH: 29.1 pg (ref 26.0–34.0)
MCHC: 31 g/dL (ref 30.0–36.0)
MCV: 93.8 fL (ref 80.0–100.0)
Platelets: 358 10*3/uL (ref 150–400)
RBC: 2.89 MIL/uL — ABNORMAL LOW (ref 4.22–5.81)
RDW: 15.9 % — AB (ref 11.5–15.5)
WBC: 8.6 10*3/uL (ref 4.0–10.5)
nRBC: 0 % (ref 0.0–0.2)

## 2018-11-14 LAB — GLUCOSE, CAPILLARY
Glucose-Capillary: 152 mg/dL — ABNORMAL HIGH (ref 70–99)
Glucose-Capillary: 157 mg/dL — ABNORMAL HIGH (ref 70–99)
Glucose-Capillary: 138 mg/dL — ABNORMAL HIGH (ref 70–99)
Glucose-Capillary: 108 mg/dL — ABNORMAL HIGH (ref 70–99)

## 2018-11-14 LAB — BASIC METABOLIC PANEL
Anion gap: 9 (ref 5–15)
BUN: 13 mg/dL (ref 6–20)
CO2: 24 mmol/L (ref 22–32)
Calcium: 8.2 mg/dL — ABNORMAL LOW (ref 8.9–10.3)
Chloride: 101 mmol/L (ref 98–111)
Creatinine, Ser: 0.42 mg/dL — ABNORMAL LOW (ref 0.61–1.24)
GFR calc Af Amer: >60 mL/min (ref >60)
GFR calc non Af Amer: >60 mL/min (ref >60)
Glucose, Bld: 132 mg/dL — ABNORMAL HIGH (ref 70–99)
Potassium: 4.6 mmol/L (ref 3.5–5.1)
Sodium: 134 mmol/L — ABNORMAL LOW (ref 135–145)

## 2018-11-14 LAB — HEPARIN LEVEL (UNFRACTIONATED): Heparin Unfractionated: 0.32 IU/mL (ref 0.30–0.70)

## 2018-11-14 MED ORDER — INSULIN ASPART 100 UNIT/ML ~~LOC~~ SOLN
0.0000 [IU] | Freq: Every day | SUBCUTANEOUS | Status: DC
  Administered 2018-11-19 – 2018-11-22 (×3): 3 [IU] via SUBCUTANEOUS
  Administered 2018-11-24 – 2018-11-27 (×2): 2 [IU] via SUBCUTANEOUS
  Administered 2018-11-28: 3 [IU] via SUBCUTANEOUS
  Administered 2018-11-30: 2 [IU] via SUBCUTANEOUS
  Administered 2018-12-01: 3 [IU] via SUBCUTANEOUS
  Administered 2018-12-10: 2 [IU] via SUBCUTANEOUS
  Administered 2018-12-11: 3 [IU] via SUBCUTANEOUS

## 2018-11-14 MED ORDER — TRAVASOL 10 % IV SOLN: INTRAVENOUS | Status: AC

## 2018-11-14 MED ORDER — INSULIN ASPART 100 UNIT/ML ~~LOC~~ SOLN
0.0000 [IU] | Freq: Three times a day (TID) | SUBCUTANEOUS | Status: DC
  Administered 2018-11-14 – 2018-11-15 (×4): 4 [IU] via SUBCUTANEOUS
  Administered 2018-11-16 (×2): 7 [IU] via SUBCUTANEOUS
  Administered 2018-11-17: 4 [IU] via SUBCUTANEOUS
  Administered 2018-11-17: 7 [IU] via SUBCUTANEOUS
  Administered 2018-11-18: 4 [IU] via SUBCUTANEOUS
  Administered 2018-11-18: 3 [IU] via SUBCUTANEOUS
  Administered 2018-11-18: 4 [IU] via SUBCUTANEOUS
  Administered 2018-11-19: 3 [IU] via SUBCUTANEOUS
  Administered 2018-11-20: 11 [IU] via SUBCUTANEOUS

## 2018-11-14 NOTE — Progress Notes (Signed)
PHARMACY - ADULT TOTAL PARENTERAL NUTRITION CONSULT NOTE   Pharmacy Consult for TPN  Indication: gastric outlet obstruction 2/2 advanced pancreatic adenocarcinoma s/p laparoscopy, open gastrojejunostomy on 2/13  A/P: per [facility], to wean off of TPN today - reduce rate to 40 ml/hr now then stop TPN at 1800 this PM. Note that patient was on Levemir PTA which has not been restarted inpatient - will continue SSI but will need to monitor CBGs closely for need to restart long acting insulin  Adrian Saran, PharmD, BCPS 11/14/2018 8:22 AM

## 2018-11-14 NOTE — Progress Notes (Signed)
2 Days Post-Op   Subjective/Chief Complaint: No complaints. Tolerating diet well   Objective: Vital signs in last 24 hours: Temp:  [97.8 F (36.6 C)-98.3 F (36.8 C)] 98 F (36.7 C) (02/23 8299) Pulse Rate:  [102-105] 105 (02/23 0632) Resp:  [18] 18 (02/23 3716) BP: (101-125)/(81-92) 125/92 (02/23 9678) SpO2:  [99 %-100 %] 100 % (02/23 9381) Last BM Date: 11/12/18  Intake/Output from previous day: 02/22 0701 - 02/23 0700 In: 1774.4 [P.O.:360; I.V.:1414.4] Out: 1950 [Urine:1950] Intake/Output this shift: No intake/output data recorded.  General appearance: alert and cooperative Resp: clear to auscultation bilaterally Cardio: regular rate and rhythm GI: soft, nontendern. incision looks good  Lab Results:  Recent Labs    11/13/18 0800 11/14/18 0511  WBC 7.4 8.6  HGB 8.0* 8.4*  HCT 26.1* 27.1*  PLT 341 358   BMET Recent Labs    11/13/18 0800 11/14/18 0511  NA 134* 134*  K 4.3 4.6  CL 103 101  CO2 28 24  GLUCOSE 75 132*  BUN 16 13  CREATININE 0.38* 0.42*  CALCIUM 8.0* 8.2*   PT/INR No results for input(s): LABPROT, INR in the last 72 hours. ABG No results for input(s): PHART, HCO3 in the last 72 hours.  Invalid input(s): PCO2, PO2  Studies/Results: No results found.  Anti-infectives: Anti-infectives (From admission, onward)   Start     Dose/Rate Route Frequency Ordered Stop   11/05/18 0930  cefoTEtan (CEFOTAN) 2 g in sodium chloride 0.9 % 100 mL IVPB  Status:  Discontinued     2 g 200 mL/hr over 30 Minutes Intravenous  Once 11/04/18 1237 11/04/18 1323   11/04/18 2130  cefoTEtan (CEFOTAN) 2 g in sodium chloride 0.9 % 100 mL IVPB     2 g 200 mL/hr over 30 Minutes Intravenous  Once 11/04/18 1323 11/04/18 2128   11/04/18 0815  cefoTEtan (CEFOTAN) 2 g in sodium chloride 0.9 % 100 mL IVPB     2 g 200 mL/hr over 30 Minutes Intravenous On call to O.R. 11/03/18 1135 11/04/18 0931   11/01/18 1000  fluconazole (DIFLUCAN) tablet 100 mg  Status:  Discontinued      100 mg Oral Daily 11/01/18 0828 11/04/18 1237   10/29/18 1600  fluconazole (DIFLUCAN) IVPB 100 mg  Status:  Discontinued     100 mg 50 mL/hr over 60 Minutes Intravenous Every 24 hours 10/29/18 1529 11/01/18 0828   10/11/18 2300  vancomycin (VANCOCIN) IVPB 750 mg/150 ml premix  Status:  Discontinued     750 mg 150 mL/hr over 60 Minutes Intravenous Every 12 hours 10/11/18 1037 10/13/18 1954   10/11/18 1500  ceFAZolin (ANCEF) IVPB 2g/100 mL premix     2 g 200 mL/hr over 30 Minutes Intravenous To Radiology 10/08/18 1348 10/12/18 1500   10/11/18 1200  ceFEPIme (MAXIPIME) 1 g in sodium chloride 0.9 % 100 mL IVPB  Status:  Discontinued     1 g 200 mL/hr over 30 Minutes Intravenous Every 8 hours 10/11/18 1037 10/13/18 1954   10/11/18 1045  vancomycin (VANCOCIN) 1,250 mg in sodium chloride 0.9 % 250 mL IVPB     1,250 mg 166.7 mL/hr over 90 Minutes Intravenous  Once 10/11/18 1037 10/11/18 2057   10/07/18 1600  ceFEPIme (MAXIPIME) 1 g in sodium chloride 0.9 % 100 mL IVPB     1 g 200 mL/hr over 30 Minutes Intravenous Every 8 hours 10/07/18 1452 10/08/18 0105   10/06/18 1000  fluconazole (DIFLUCAN) tablet 100 mg  Status:  Discontinued  100 mg Oral Daily 10/06/18 0709 10/11/18 0834   10/02/18 2100  vancomycin (VANCOCIN) IVPB 750 mg/150 ml premix  Status:  Discontinued     750 mg 150 mL/hr over 60 Minutes Intravenous Every 12 hours 10/02/18 0812 10/04/18 1420   10/02/18 0900  vancomycin (VANCOCIN) 1,250 mg in sodium chloride 0.9 % 250 mL IVPB     1,250 mg 166.7 mL/hr over 90 Minutes Intravenous  Once 10/02/18 0812 10/02/18 1132   10/02/18 0900  ceFEPIme (MAXIPIME) 1 g in sodium chloride 0.9 % 100 mL IVPB  Status:  Discontinued     1 g 200 mL/hr over 30 Minutes Intravenous Every 8 hours 10/02/18 0812 10/07/18 0925   10/02/18 0815  ceFEPIme (MAXIPIME) 1 g in sodium chloride 0.9 % 100 mL IVPB  Status:  Discontinued     1 g 200 mL/hr over 30 Minutes Intravenous Every 8 hours 10/02/18 0800  10/02/18 0812   10/02/18 0800  ceFEPIme (MAXIPIME) 2 g in sodium chloride 0.9 % 100 mL IVPB  Status:  Discontinued     2 g 200 mL/hr over 30 Minutes Intravenous  Once 10/02/18 0757 10/02/18 0800   10/02/18 0800  metroNIDAZOLE (FLAGYL) IVPB 500 mg  Status:  Discontinued     500 mg 100 mL/hr over 60 Minutes Intravenous Every 8 hours 10/02/18 0757 10/02/18 0800   10/02/18 0800  vancomycin (VANCOCIN) IVPB 1000 mg/200 mL premix  Status:  Discontinued     1,000 mg 200 mL/hr over 60 Minutes Intravenous  Once 10/02/18 0757 10/02/18 0800   09/27/18 0000  ceFAZolin (ANCEF) IVPB 2g/100 mL premix     2 g 200 mL/hr over 30 Minutes Intravenous To Radiology 09/25/18 0902 09/28/18 0000   09/10/18 2130  ceFAZolin (ANCEF) IVPB 2g/100 mL premix     2 g 200 mL/hr over 30 Minutes Intravenous Every 8 hours 09/10/18 1634 09/11/18 0700   09/10/18 0800  ceFAZolin (ANCEF) IVPB 1 g/50 mL premix    Note to Pharmacy:  Send with pt to OR   1 g 100 mL/hr over 30 Minutes Intravenous On call 09/09/18 0756 09/10/18 1407   09/07/18 1300  erythromycin 250 mg in sodium chloride 0.9 % 100 mL IVPB     250 mg 100 mL/hr over 60 Minutes Intravenous Once 09/07/18 1125 09/07/18 2351   09/06/18 0000  ceFAZolin (ANCEF) IVPB 1 g/50 mL premix    Note to Pharmacy:  Send with pt to OR   1 g 100 mL/hr over 30 Minutes Intravenous On call 09/05/18 0849 09/05/18 0952   09/03/18 2245  ceFAZolin (ANCEF) IVPB 2g/100 mL premix     2 g 200 mL/hr over 30 Minutes Intravenous Every 8 hours 09/03/18 2236 09/04/18 1444   09/03/18 1615  ceFAZolin (ANCEF) IVPB 2g/100 mL premix  Status:  Discontinued     2 g 200 mL/hr over 30 Minutes Intravenous On call to O.R. 09/03/18 1600 09/03/18 2035      Assessment/Plan: s/p Procedure(s): ESOPHAGOGASTRODUODENOSCOPY (EGD) WITH PROPOFOL (N/A) COLONOSCOPY WITH PROPOFOL (N/A) Advance diet  s/p Procedure(s): ESOPHAGOGASTRODUODENOSCOPY (EGD) WITH PROPOFOL (N/A) COLONOSCOPY WITH PROPOFOL (N/A) Advance  diet. Soft food today Pulmonary embolism on Heparin Hx popliteal artery embolism Chronic bilateral pulmonary embolisms2/5/20 Hx HCAP/Pseudomonas UTI Hx ischemic right popliteal artery/tibial artery embolism with fasciotomy12/13/19 - ongoing ischemic pain - uses walker Cardiomyopathy, Hx combined systolic diastolic CHF-EF 45 to 65%, grade 1 diastolic dysfunction, trivial AR, mild MR/TR Uncontrolled diabetes-hemoglobin A1c 18.2-glucose controlled in hospital Severe protein calorie malnutrition  HxGastric  and omental varices likely from portal venous hypertension. Chronic pain/hx of substance abuse -ETOH/Cocaine/tobacco Anemia H/H: 7.8/25.2>>7.0/22.5; being transfused 11/09/18 Hx exploratory laparotomy,closure of pyloric channel ulcer with Phillip Heal patch 12/2003 Dr. 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 Rodman Key MartinPOD#[id number].   FEN:IV fluids/TPN/ sips of clears >> clears from the floor ID: Diflucan 2/7>>2/12 (candida esophagitis) cefotetan preop 11/04/2018 DVT: SCD/Lovenox Follow up: TBD  Plan: he is tolerating diet. Would wean tpn to off. His Protonix has been increased to twice daily, I have added Carafate. Changing more pain medications to oral and get him off the Dilaudid. DC the Robaxin.   LOS: 72 days    Ryan Medina 11/14/2018

## 2018-11-14 NOTE — Progress Notes (Signed)
[place] for Heparin  Indication: PE, DVT  Allergies  Allergen Reactions  . Lisinopril Anaphylaxis   Patient Measurements: Height: 5\' 11"  (180.3 cm) Weight: 123 lb 14.4 oz (56.2 kg) IBW/kg (Calculated) : 75.3 Heparin Dosing Weight: 66.7  Vital Signs: Temp: 98 F (36.7 C) (02/23 0632) Temp Source: Oral (02/23 2505) BP: 125/92 (02/23 3976) Pulse Rate: 105 (02/23 0632)  Labs: Recent Labs    11/12/18 0334  11/13/18 0800 11/13/18 1439 11/13/18 2100 11/14/18 0511  HGB 8.5*  --  8.0*  --   --  8.4*  HCT 27.9*  --  26.1*  --   --  27.1*  PLT 375  --  341  --   --  358  HEPARINUNFRC  --    < > 0.32 0.46 0.32 0.32  CREATININE 0.40*  --  0.38*  --   --  0.42*   < > = values in this interval not displayed.   Estimated Creatinine Clearance: 81 mL/min (A) (by C-G formula based on SCr of 0.42 mg/dL (L)).  Assessment: 58 yo male with arterial embolus s/p embolectomy. Patient had development of hematoma with compartment syndrome as well as hemorrhagic shock secondary to a bleeding duodenal ulcer.   Events this admission on 09/03/18:  LLE DVT/ischemia s/p urgent left popliteal artery embolectomy 12/13 >> RLE hematoma on 12/15 am s/p evacuation in OR.Heparin stopped 2/2 drop in hgb, hematemesis. -Heparin cautiously started 12/22,had few episodes of hematemesis & nose bleed 12/24> stopped heparin per dr. Candiss Norse S/p EUS 12/26:  pancreatic mass eroding into duodenum -VVS notes ikely too high risk for [facility]  12/28- Dr. Candiss Norse noted Continue PPI and Hold Heparin indefinitely  2/5: pharmacy consulted to dose heparin with no bolus by Dr Marin Olp for acute PE. Previously on 12/24 his heparin level was therapeutic at 0.35 on 1200 units/hr. 2/7: Heparin off at 09 am for EDG, heparin to resume after NG placed per GI instructions. KUB 1629> NG in stomach 2/13: Expl lap & Gastrojejunostomy- not able to use jejunostomy for enteral feeds. Heparin resumed at 2000 at  rate of 1600 units/hr 2/14 heparin tx to lovenox (heparin at 1700 units/hr) 2/19 lovenox to heparin in anticipation of colonoscopy 2/21 heparin held at 0300 for EGD, HL 0.56 on 1400 units/hr  11/14/2018   Heparin level continues to be therapeutic on current IV heparin rate of 1450 units/hr  CBC 8, down slightly. Plts stable  No reported bleeding   Goal of Therapy:  Monitor platelets by anticoagulation protocol: Yes Heparin level 0.3-0.5 units/ml   Plan:   Continue IV heparin drip at current rate of 1450 units/hr  Daily CBC and HL   Monitor for bleeding  Monitor anticoag plans once IV heparin is able to be stopped   Adrian Saran, PharmD, BCPS Pager [phone] 11/14/2018 8:23 AM

## 2018-11-14 NOTE — Progress Notes (Signed)
REASON FOR CONSULT:    Ischemic right foot.  Consult is requested by the medical service.  ASSESSMENT & PLAN:   GANGRENE RIGHT FOOT: This patient has gangrene of the right foot.  This is clearly not salvageable.  He has a palpable femoral and popliteal pulse on the right with a fairly brisk anterior tibial and peroneal signal with the Doppler.  I would recommend primary right below the knee amputation.  Currently I do not think the foot is making him septic and he is very reluctant to consider amputation at this time.  If he wishes to proceed with right below the knee amputation then the options would be to have the patient transferred to [facility] for me to perform the surgery next week or second option would be to have 1 of the orthopedic surgeons do the surgery at [facility].  In addition, the patient has multiple significant medical issues and would need to be cleared from a medical standpoint to proceed with any further surgery.  I have again discussed with him the importance of tobacco cessation.  Please notify us if we can be of any further assistance.  [name], MD, FACS Beeper [phone] Office: [phone]   HPI:   Ryan Medina is a pleasant 58 y.o. male, who is well-known to me. I had originally seen this patient in December 2019 when he presented with an ischemic right lower extremity.  He was found to have a popliteal embolus on the right.  On 09/03/2018, he underwent right popliteal and tibial embolectomy.  On 09/05/2018 he underwent evacuation of a hematoma and lymphocele of the right leg for compartment fasciotomy.  On 09/10/2018 he underwent right popliteal and tibial embolectomy and closure of the medial and lateral fasciotomy incisions.  I last saw the patient when he was in the hospital on 09/28/2018.  At that time his incisions were healing nicely and he did not have significant issues with the right foot.  I was to see him as an outpatient.  However since that  time he has been admitted with multiple other issues and has had a prolonged hospitalization.  This patient has a complicated medical history.  He has a history of adenocarcinoma of the pancreas.  He had presented with hemorrhagic shock secondary to a GI bleed from a duodenal ulcer.  He had a gastric outlet obstruction.  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  He has severe protein calorie malnutrition.  He tells me that the foot has been dark for over a month.  It is not clear to me how long he has had gangrene of the right foot.  He is somewhat of a poor historian.  He does continue to smoke some.  Past Medical History:  Diagnosis Date  . Back pain, chronic   . Diabetes ([place])   . Dilated cardiomyopathy ([place])    Echo 2/18: EF 45-50, diff HK, Gr 1 DD, trivial AI/TR  . Goals of care, counseling/discussion 09/24/2018  . HLD (hyperlipidemia)   . Hypertension   . IDDM (insulin dependent diabetes mellitus) ([place])   . Ruptured lumbar disc     Family History  Problem Relation Age of Onset  . Diabetes Mother   . Heart failure Mother   . Diabetes Sister   . Diabetes Brother     SOCIAL HISTORY: Social History   Socioeconomic History  . Marital status: Single    Spouse name: Not on file  .  Number of children: Not on file  . Years of education: Not on file  . Highest education level: Not on file  Occupational History  . Not on file  Social Needs  . Financial resource strain: Not on file  . Food insecurity:    Worry: Not on file    Inability: Not on file  . Transportation needs:    Medical: Not on file    Non-medical: Not on file  Tobacco Use  . Smoking status: Current Every Day Smoker    Packs/day: 0.25    Years: 25.00    Pack years: 6.25    Types: Cigarettes  . Smokeless tobacco: Never Used  . Tobacco comment: 2 per day   Substance and Sexual Activity  . Alcohol use: No    Frequency: Never  . Drug use: No  .  Sexual activity: Yes  Lifestyle  . Physical activity:    Days per week: Not on file    Minutes per session: Not on file  . Stress: Not on file  Relationships  . Social connections:    Talks on phone: Not on file    Gets together: Not on file    Attends religious service: Not on file    Active member of club or organization: Not on file    Attends meetings of clubs or organizations: Not on file    Relationship status: Not on file  . Intimate partner violence:    Fear of current or ex partner: Not on file    Emotionally abused: Not on file    Physically abused: Not on file    Forced sexual activity: Not on file  Other Topics Concern  . Not on file  Social History Narrative  . Not on file    Allergies  Allergen Reactions  . Lisinopril Anaphylaxis    Current Facility-Administered Medications  Medication Dose Route Frequency Provider Last Rate Last Dose  . 0.9 %  sodium chloride infusion   Intravenous PRN Eugenie Filler, MD 10 mL/hr at 11/12/18 0400    . acetaminophen (TYLENOL) tablet 1,000 mg  1,000 mg Oral Q8H Earnstine Regal, PA-C   1,000 mg at 11/14/18 1013  . feeding supplement (ENSURE ENLIVE) (ENSURE ENLIVE) liquid 237 mL  237 mL Oral QID Eugenie Filler, MD   237 mL at 11/14/18 1014  . heparin ADULT infusion 100 units/mL (25000 units/262mL sodium chloride 0.45%)  1,450 Units/hr Intravenous Continuous Dorrene German, RPH 14.5 mL/hr at 11/14/18 8315 1,450 Units/hr at 11/14/18 1761  . heparin lock flush 100 unit/mL  500 Units Intracatheter Q30 days Rayburn, Kelly A, PA-C       And  . heparin lock flush 100 unit/mL  500 Units Intracatheter PRN Rayburn, Floyce Stakes, PA-C      . HYDROmorphone (DILAUDID) injection 0.5-1 mg  0.5-1 mg Intravenous Q4H PRN Earnstine Regal, PA-C   1 mg at 11/14/18 1019  . insulin aspart (novoLOG) injection 0-20 Units  0-20 Units Subcutaneous TID WC Adrian Saran, [facility]      . insulin aspart (novoLOG) injection 0-5 Units  0-5 Units  Subcutaneous QHS Arlyn Dunning M, RPH      . ipratropium-albuterol (DUONEB) 0.5-2.5 (3) MG/3ML nebulizer solution 3 mL  3 mL Nebulization Q4H PRN Rayburn, Kelly A, PA-C   3 mL at 11/07/18 1544  . lip balm (CARMEX) ointment   Topical PRN Georgette Shell, MD      . LORazepam (ATIVAN) injection 1 mg  1 mg  Intravenous Q4H PRN Rayburn, Floyce Stakes, PA-C      . menthol-cetylpyridinium (CEPACOL) lozenge 3 mg  1 lozenge Oral PRN Rayburn, Kelly A, PA-C      . metoprolol tartrate (LOPRESSOR) injection 5 mg  5 mg Intravenous Q6H PRN Rayburn, Kelly A, PA-C      . ondansetron (ZOFRAN) injection 4 mg  4 mg Intravenous Q6H PRN Rayburn, Kelly A, PA-C      . oxyCODONE (ROXICODONE) 5 MG/5ML solution 7.5 mg  7.5 mg Oral Q4H PRN Earnstine Regal, PA-C   7.5 mg at 11/14/18 0651  . pantoprazole (PROTONIX) injection 40 mg  40 mg Intravenous Q12H Luiz Ochoa, RPH   40 mg at 11/14/18 1013  . phenol (CHLORASEPTIC) mouth spray 1 spray  1 spray Mouth/Throat PRN Rayburn, Kelly A, PA-C      . sodium chloride flush (NS) 0.9 % injection 10-40 mL  10-40 mL Intracatheter PRN Volanda Napoleon, MD   10 mL at 11/12/18 1900  . sucralfate (CARAFATE) 1 GM/10ML suspension 1 g  1 g Oral TID WC & HS Earnstine Regal, PA-C   1 g at 11/14/18 0800  . TPN ADULT (ION)   Intravenous Continuous TPN Adrian Saran, RPH 40 mL/hr at 11/14/18 0901      REVIEW OF SYSTEMS:  [X]  denotes positive finding, [ ]  denotes negative finding Cardiac  Comments:  Chest pain or chest pressure:    Shortness of breath upon exertion:    Short of breath when lying flat:    Irregular heart rhythm:        Vascular    Pain in calf, thigh, or hip brought on by ambulation:    Pain in feet at night that wakes you up from your sleep:     Blood clot in your veins:    Leg swelling:         Pulmonary    Oxygen at home:    Productive cough:     Wheezing:         Neurologic    Sudden weakness in arms or legs:     Sudden numbness in arms or legs:       Sudden onset of difficulty speaking or slurred speech:    Temporary loss of vision in one eye:     Problems with dizziness:         Gastrointestinal    Blood in stool:  x   Vomited blood:         Genitourinary    Burning when urinating:     Blood in urine:        Psychiatric    Major depression:         Hematologic    Bleeding problems: x   Problems with blood clotting too easily:        Skin    Rashes or ulcers:        Constitutional    Fever or chills:     PHYSICAL EXAM:   Vitals:   11/13/18 1347 11/13/18 2132 11/14/18 0632 11/14/18 1247  BP: 101/81 122/88 (!) 125/92 112/87  Pulse:  (!) 102 (!) 105 99  Resp: 18 18 18 16   Temp: 97.8 F (36.6 C) 98.3 F (36.8 C) 98 F (36.7 C) 98 F (36.7 C)  TempSrc: Oral Oral Oral Oral  SpO2: 100% 99% 100% 100%  Weight:      Height:       GENERAL: The patient is a well-nourished male, in no  acute distress. The vital signs are documented above. CARDIAC: There is a regular rate and rhythm.  VASCULAR: I do not detect carotid bruits. On the right side, which is the side of concern, he has a palpable femoral and popliteal pulse.  He has a brisk peroneal and anterior tibial signal with the Doppler.  I cannot obtain a posterior tibial signal. On the left side he has a palpable femoral, popliteal, dorsalis pedis, and posterior tibial pulse. PULMONARY: There is good air exchange bilaterally without wheezing or rales. ABDOMEN: Soft and non-tender with normal pitched bowel sounds.  MUSCULOSKELETAL: There are no major deformities or cyanosis. NEUROLOGIC: No focal weakness or paresthesias are detected. SKIN: He has extensive gangrene of the right foot.   PSYCHIATRIC: The patient has a normal affect.  DATA:    Labs: His potassium is 4.6.  Hemoglobin is 8.4 hematocrit 27.1.  Platelets 358,000.  White blood cell count 8.6.   [name] [facility] [phone]

## 2018-11-14 NOTE — Progress Notes (Signed)
PROGRESS NOTE    Ryan Medina  GGY:[id number] DOB: Jul 20, 1961 DOA: 09/03/2018 PCP: Patient, No Pcp Per    Brief Narrative: 58 year old with history of peripheral vascular disease, dilated cardiomyopathy, essential hypertension, diabetes mellitus type 2 initially came to the hospital with right lower extremity pain and was found to have ischemic leg requiring embolectomy on 12/13. He was placed on heparin drip subsequently developed right lower extremity hematoma requiring fasciotomy on 12/15. Unfortunately afterwards he decompensated with active GI bleed and was intubated and was moved to the ICU. He required embolization on 12/17. During the course of his hospitalization he is required total of 25 units of blood. Further evaluation showed pancreatic cancer with duodenal ulcer. Transferred to [place] long for radiation treatment to help with bleeding. Hospital course complicated by sepsis from healthcare acquired pneumonia and Pseudomonas UTI requiring treatment with vancomycin and cefepime.Patient found to have chronic PE therefore started on heparin drip per oncology. Repeat endoscopy showed worsening of gastric outlet obstruction. Initially NG tube placed by patient removed it. Right now on clear liquid diet   Assessment & Plan:   Principal Problem:   Popliteal artery embolus (HCC) Active Problems:   HCAP (healthcare-associated pneumonia)   DM (diabetes mellitus), type 2, uncontrolled ([place])   Essential hypertension   Dilated cardiomyopathy (HCC)   Chest pain   AAA (abdominal aortic aneurysm) (HCC)   Ischemic foot   Right leg claudication (HCC)   PVD (peripheral vascular disease) ([place])   Hemorrhagic shock (HCC)   Nontraumatic ischemic infarction of muscle of right lower leg   Upper GI bleed   Cocaine abuse ([place])   Acute blood loss anemia   Chronic pain syndrome   Duodenal ulcer with hemorrhage   Pancreatic cancer ([place])   Goals of care, counseling/discussion   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  Patient was transfused a unit packed red blood cells overnight.  Hemoglobin currently at 8.4 from 8.0 from 8.5 from 8.6 from 7.0.  Questionable etiology.  Patient being followed by oncology who anticipating starting patient on chemotherapy and requesting that GI reassess patient for possible colonoscopy for further evaluation prior to initiation of chemotherapy.  Patient has been changed to IV heparin per oncology.  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  Continue PPI every 12 hours.  Carafate added per general surgery.  Diet advanced to soft diet per gastroenterology which patient is tolerating.  GI following and appreciate their input and recommendations.  Adenocarcinoma of pancreas Hemorrhagic shock due to GI bleed from duodenal ulcer,  stable Gastric outlet obstruction causing dysphagia and abdominal distention -CT scan showed increase in size of the mass with involvement of intra-abdominal arteries, deemed to be inoperable. Status post radiation. Per oncology plans for outpatient radiation and chemotherapy -Status post endoscopy 12/17, GDA embolization 12/17, status post radiation. -Continue PPI. -Endoscopy showed worsening of gastric outlet obstruction/pyloric stenosis secondary to malignancy. Candida esophagitis -S/Plaparoscopy open gastrojejunostomy no pancreatic mass amenable to biopsy -PICC WAS NOT PLACED AS HE HAD A PORT .Continue TPN. -Patient on clear liquids which has been advanced to full liquids, for now in anticipation of further evaluation of rectal bleeding.  Patient underwent upper endoscopy by Dr. Hilarie Fredrickson on 11/12/2018, which showed normal esophagus, no varices, small hiatal hernia, gastrojejunostomy found characterized by ulceration which was not bleeding, though likely source of recent hematochezia, gastric stenosis was found at the pylorus from known invasive adenocarcinoma.  Hemoglobin currently at 8.4.  Continue PPI.  Patient started on Carafate per general surgery.  Transfusion threshold hemoglobin less than 7.  General surgery following.  GI following.  Candida esophagitis Status post full course of Diflucan.     Chronic bilateral pulmonary embolismstarted Lovenox Lovenox discontinued the morning of 11/10/2018, patient placed on heparin due to bright red blood per rectum.  Per oncology.  Anemia of chronic disease Status post multiple blood transfusions25UNITS blood transfusions Had bloody bowel movements over the past few days initially felt secondary to constipation.  Hemoglobin was 7.  Status post 1 unit packed red blood cells.  Hemoglobin currently at 8.4.  Follow H&H.   Hypokalemia/hypomagnesemia -Potassium repleted and at 4.6.  Magnesium at 1.8.   Sepsis secondary to H CAP and  Pseudomonas urinary tract infection -Status post full course of IV vancomycin IV cefepime. PICC line pulled out 1/26.  Ischemic right foot with right popliteal and tibial artery embolus -Status post embolectomy. Complicated by compartment syndrome requiring fasciotomy.  Patient's right foot is now black with peeling of skin on the dorsum of the foot and very tender to palpation.  Unable to palpate pulses.  Will get Doppler to check pulses.  Concern for gangrene.  Will ask vascular surgery to reassess and give further recommendations.  Continue anticoagulation with heparin for now.  Cardiomyopathy, combined systolic and diastolic CHF, chronic -Patient currently euvolemic.  Follow.   Uncontrolled diabetes mellitus type 2 -Hemoglobin A1c 18.2. CBG of 152 this morning.  Continue Lantus and sliding scale insulin.  Follow.   Severe protein calorie malnutrition Patient diet was advanced to a soft diet on 11/12/2018 which he seems to be tolerating.  TPN being weaned off today per general surgery.   Generalized weakness-physical therapy/Occupational Therapy-recommending patient seen by PT/OT who are recommending SNF.    Severe protein calorie malnutrition Patient on TPN.  Patient tolerating soft diet.  TPN to be weaned off per general surgery.  Follow.     DVT prophylaxis: Heparin Code Status: Full Family Communication: Updated patient.  No family at bedside. Disposition Plan: To be determined.  Patient homeless and likely not a suitable candidate for shelter at this time due to ongoing medical issues.   Consultants:   Gastroenterology  Oncology  Conventional radiology  Pulmonary and critical care medicine  Vascular surgery  Palliative care  General surgery  Cardiology  Procedures:  Laparoscopy, open gastrojejunostomy per Dr. Hassell Done 11/04/2018  Endoscopy 09/07/2018  EUS 09/16/2018  Chemo-port placement 10/12/2018  Repeat endoscopy 10/29/2018--per Dr. Loletha Carrow  III  Repeat upper endoscopy 11/12/2018 per Dr. Hilarie Fredrickson  Antimicrobials:   IV vancomycin--finished course  IV cefepime--- finished course   Subjective: Patient laying in bed.  Denies any nausea or vomiting.  Tolerating soft diet.  Right foot pain.  Denies any further rectal bleeding.   Objective: Vitals:   11/13/18 0450 11/13/18 1347 11/13/18 2132 11/14/18 0632  BP: 119/88 101/81 122/88 (!) 125/92  Pulse:   (!) 102 (!) 105  Resp: 18 18 18 18   Temp: 98 F (36.7 C) 97.8 F (36.6 C) 98.3 F (36.8 C) 98 F (36.7 C)  TempSrc: Oral Oral Oral Oral  SpO2: 100% 100% 99% 100%  Weight:      Height:        Intake/Output Summary (Last 24 hours) at 11/14/2018 1125 Last data filed at 11/14/2018 1023 Gross per 24 hour  Intake 1774.38 ml  Output 2550 ml  Net -775.62 ml   Filed Weights   11/02/18 1806 11/04/18 0831 11/12/18 0742  Weight: 56.2 kg 56.2 kg 56.2 kg    Examination:  General exam: NAD Respiratory system: CTAB.  No wheezes, no crackles, no rhonchi.  Normal respiratory effort.  Cardiovascular system: RRR no murmurs rubs or gallops.  No JVD.  No lower extremity edema.  Gastrointestinal system: Abdomen is nontender, nondistended, soft, positive bowel sounds.  Midabdominal surgical wound clean dry and intact.  Staples in place.  Central nervous system: Alert and oriented. No focal neurological deficits. Extremities: Right foot is black, skin peeling on the dorsum of the foot.  Unable to get any pedal pulses.  Right foot pain.  Symmetric 5 x 5 power. Skin: No rashes, lesions or ulcers Psychiatry: Judgement and insight appear normal. Mood & affect appropriate.     Data Reviewed: I have personally reviewed following labs and imaging studies  CBC: Recent Labs  Lab 11/08/18 0455  11/10/18 0559 11/11/18 0010 11/12/18 0334 11/13/18 0800 11/14/18 0511  WBC 6.2   < > 8.4 8.3 8.1 7.4 8.6  NEUTROABS 5.0  --   --   --   --   --   --   HGB 7.8*   < > 8.6* 8.7* 8.5* 8.0* 8.4*   HCT 25.2*   < > 26.5* 28.5* 27.9* 26.1* 27.1*  MCV 91.6   < > 89.5 91.6 92.1 93.9 93.8  PLT 297   < > 322 358 375 341 358   < > = values in this interval not displayed.   Basic Metabolic Panel: Recent Labs  Lab 11/09/18 0338 11/10/18 0559 11/11/18 0010 11/12/18 0334 11/13/18 0800 11/14/18 0511  NA 134* 132* 132* 136 134* 134*  K 4.1 4.5 4.5 4.6 4.3 4.6  CL 99 99 98 100 103 101  CO2 29 26 29 29 28 24   GLUCOSE 128* 152* 243* 142* 75 132*  BUN 17 16 16 19 16 13   CREATININE 0.42* 0.47* 0.52* 0.40* 0.38* 0.42*  CALCIUM 8.0* 8.0* 8.1* 8.3* 8.0* 8.2*  MG 1.9 2.0 2.0 2.1 1.8  --   PHOS 3.9 4.1 3.9 3.9 3.1  --    GFR: Estimated Creatinine Clearance: 81 mL/min (A) (by C-G formula based on SCr of 0.42 mg/dL (L)). Liver Function Tests: Recent Labs  Lab 11/08/18 0455 11/09/18 0338 11/10/18 0559  AST 21 23 50*  ALT 22 24 45*  ALKPHOS 65 64 73  BILITOT 0.3 0.2* 0.2*  PROT 6.2* 6.1* 6.3*  ALBUMIN 1.6* 1.7* 1.8*   No results for input(s): LIPASE, AMYLASE in the last  168 hours. No results for input(s): AMMONIA in the last 168 hours. Coagulation Profile: No results for input(s): INR, PROTIME in the last 168 hours. Cardiac Enzymes: No results for input(s): CKTOTAL, CKMB, CKMBINDEX, TROPONINI in the last 168 hours. BNP (last 3 results) No results for input(s): PROBNP in the last 8760 hours. HbA1C: No results for input(s): HGBA1C in the last 72 hours. CBG: Recent Labs  Lab 11/13/18 0726 11/13/18 1201 11/13/18 1618 11/13/18 2130 11/14/18 0728  GLUCAP 62* 196* 161* 202* 152*   Lipid Profile: No results for input(s): CHOL, HDL, LDLCALC, TRIG, CHOLHDL, LDLDIRECT in the last 72 hours. Thyroid Function Tests: No results for input(s): TSH, T4TOTAL, FREET4, T3FREE, THYROIDAB in the last 72 hours. Anemia Panel: No results for input(s): VITAMINB12, FOLATE, FERRITIN, TIBC, IRON, RETICCTPCT in the last 72 hours. Sepsis Labs: No results for input(s): PROCALCITON, LATICACIDVEN in the  last 168 hours.  No results found for this or any previous visit (from the past 240 hour(s)).       Radiology Studies: No results found.      Scheduled Meds: . acetaminophen  1,000 mg Oral Q8H  . feeding supplement (ENSURE ENLIVE)  237 mL Oral QID  . heparin lock flush  500 Units Intracatheter Q30 days  . insulin aspart  0-20 Units Subcutaneous TID WC  . insulin aspart  0-5 Units Subcutaneous QHS  . pantoprazole (PROTONIX) IV  40 mg Intravenous Q12H  . sucralfate  1 g Oral TID WC & HS   Continuous Infusions: . sodium chloride 10 mL/hr at 11/12/18 0400  . heparin 1,450 Units/hr (11/14/18 0311)  . TPN ADULT (ION) 40 mL/hr at 11/14/18 0901     LOS: 72 days    Time spent: 35 minutes    Irine Seal, MD [facility]  If 7PM-7AM, please contact night-coverage [email or web address] 11/14/2018, 11:25 AM

## 2018-11-15 ENCOUNTER — Encounter (HOSPITAL_COMMUNITY): Payer: Self-pay | Admitting: Internal Medicine

## 2018-11-15 DIAGNOSIS — I96 Gangrene, not elsewhere classified: Secondary | ICD-10-CM | POA: Diagnosis not present

## 2018-11-15 LAB — BASIC METABOLIC PANEL
Anion gap: 9 (ref 5–15)
BUN: 12 mg/dL (ref 6–20)
CHLORIDE: 101 mmol/L (ref 98–111)
CO2: 25 mmol/L (ref 22–32)
Calcium: 8.1 mg/dL — ABNORMAL LOW (ref 8.9–10.3)
Creatinine, Ser: 0.48 mg/dL — ABNORMAL LOW (ref 0.61–1.24)
GFR calc Af Amer: >60 mL/min (ref >60)
GFR calc non Af Amer: >60 mL/min (ref >60)
GLUCOSE: 227 mg/dL — AB (ref 70–99)
Potassium: 4.2 mmol/L (ref 3.5–5.1)
Sodium: 135 mmol/L (ref 135–145)

## 2018-11-15 LAB — CBC
HCT: 25.8 % — ABNORMAL LOW (ref 39.0–52.0)
Hemoglobin: 8.1 g/dL — ABNORMAL LOW (ref 13.0–17.0)
MCH: 29.1 pg (ref 26.0–34.0)
MCHC: 31.4 g/dL (ref 30.0–36.0)
MCV: 92.8 fL (ref 80.0–100.0)
Platelets: 343 10*3/uL (ref 150–400)
RBC: 2.78 MIL/uL — ABNORMAL LOW (ref 4.22–5.81)
RDW: 16.1 % — AB (ref 11.5–15.5)
WBC: 8.1 10*3/uL (ref 4.0–10.5)
nRBC: 0 % (ref 0.0–0.2)

## 2018-11-15 LAB — GLUCOSE, CAPILLARY
Glucose-Capillary: 190 mg/dL — ABNORMAL HIGH (ref 70–99)
Glucose-Capillary: 109 mg/dL — ABNORMAL HIGH (ref 70–99)
Glucose-Capillary: 152 mg/dL — ABNORMAL HIGH (ref 70–99)
Glucose-Capillary: 151 mg/dL — ABNORMAL HIGH (ref 70–99)

## 2018-11-15 LAB — PREPARE RBC (CROSSMATCH)

## 2018-11-15 LAB — HEPARIN LEVEL (UNFRACTIONATED): Heparin Unfractionated: 0.52 IU/mL (ref 0.30–0.70)

## 2018-11-15 MED ORDER — DIPHENHYDRAMINE HCL 25 MG PO CAPS
25.0000 mg | ORAL_CAPSULE | Freq: Once | ORAL | Status: AC
  Administered 2018-11-15: 25 mg via ORAL
  Filled 2018-11-15: qty 1

## 2018-11-15 MED ORDER — PANTOPRAZOLE SODIUM 40 MG PO TBEC
40.0000 mg | DELAYED_RELEASE_TABLET | Freq: Two times a day (BID) | ORAL | Status: DC
  Administered 2018-11-15 – 2018-12-15 (×60): 40 mg via ORAL
  Filled 2018-11-15 (×59): qty 1

## 2018-11-15 MED ORDER — FUROSEMIDE 10 MG/ML IJ SOLN
20.0000 mg | Freq: Once | INTRAMUSCULAR | Status: AC
  Administered 2018-11-15: 20 mg via INTRAVENOUS
  Filled 2018-11-15: qty 2

## 2018-11-15 MED ORDER — ACETAMINOPHEN 325 MG PO TABS
650.0000 mg | ORAL_TABLET | Freq: Once | ORAL | Status: AC
  Administered 2018-11-15: 650 mg via ORAL
  Filled 2018-11-15: qty 2

## 2018-11-15 MED ORDER — SODIUM CHLORIDE 0.9% IV SOLUTION
Freq: Once | INTRAVENOUS | Status: AC
  Administered 2018-11-15: 15:00:00 via INTRAVENOUS

## 2018-11-15 NOTE — Progress Notes (Signed)
PROGRESS NOTE    Ashaz Robling Kangas  EPP:[id number] DOB: 10-31-1960 DOA: 09/03/2018 PCP: Patient, No Pcp Per    Brief Narrative: 58 year old with history of peripheral vascular disease, dilated cardiomyopathy, essential hypertension, diabetes mellitus type 2 initially came to the hospital with right lower extremity pain and was found to have ischemic leg requiring embolectomy on 12/13. He was placed on heparin drip subsequently developed right lower extremity hematoma requiring fasciotomy on 12/15. Unfortunately afterwards he decompensated with active GI bleed and was intubated and was moved to the ICU. He required embolization on 12/17. During the course of his hospitalization he is required total of 25 units of blood. Further evaluation showed pancreatic cancer with duodenal ulcer. Transferred to [facility] for radiation treatment to help with bleeding. Hospital course complicated by sepsis from healthcare acquired pneumonia and Pseudomonas UTI requiring treatment with vancomycin and cefepime.Patient found to have chronic PE therefore started on heparin drip per oncology. Repeat endoscopy showed worsening of gastric outlet obstruction. Initially NG tube placed by patient removed it. Right now on clear liquid diet   Assessment & Plan:   Principal Problem:   Popliteal artery embolus (HCC) Active Problems:   HCAP (healthcare-associated pneumonia)   Gangrene of right foot ([place])   DM (diabetes mellitus), type 2, uncontrolled ([place])   Essential hypertension   Dilated cardiomyopathy (HCC)   Chest pain   AAA (abdominal aortic aneurysm) (HCC)   Ischemic foot   Right leg claudication (HCC)   PVD (peripheral vascular disease) ([place])   Hemorrhagic shock ([place])   Nontraumatic ischemic infarction of muscle of right lower leg   Upper GI bleed   Cocaine abuse ([place])   Acute blood loss anemia   Chronic pain syndrome   Duodenal ulcer with hemorrhage   Pancreatic cancer ([place])   Goals of care,  counseling/discussion   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  Patient was transfused a unit packed red blood cells overnight.  Hemoglobin currently at 8.4 from 8.0 from 8.5 from 8.6 from 7.0.  Questionable etiology.  Patient being followed by oncology who anticipating starting patient on chemotherapy and requesting that GI reassess patient for possible colonoscopy for further evaluation prior to initiation of chemotherapy.  Patient has been changed to IV heparin per oncology.  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  Continue PPI every 12 hours.  Carafate added per general surgery.  Diet advanced to soft diet per gastroenterology which patient is tolerating.  GI  following and appreciate their input and recommendations.   Adenocarcinoma of pancreas Hemorrhagic shock due to GI bleed  from duodenal ulcer, stable Gastric outlet obstruction causing dysphagia and abdominal distention -CT scan showed increase in size of the mass with involvement of intra-abdominal arteries, deemed to be inoperable. Status post radiation. Per oncology plans for outpatient radiation and chemotherapy -Status post endoscopy 12/17, GDA embolization 12/17, status post radiation. -Continue PPI. -Endoscopy showed worsening of gastric outlet obstruction/pyloric stenosis secondary to malignancy. Candida esophagitis -S/Plaparoscopy open gastrojejunostomy no pancreatic mass amenable to biopsy -PICC WAS NOT PLACED AS HE HAD A PORT . Patient currently tolerating a soft diet.  TPN has been weaned off. -Patient was on clear liquids which has been advanced to full liquids now subsequently on a soft diet which he is tolerating.  Patient underwent upper endoscopy by Dr. Hilarie Fredrickson on 11/12/2018, which showed normal esophagus, no varices, small hiatal hernia, gastrojejunostomy found characterized by ulceration which was not bleeding, though likely source of recent hematochezia, gastric stenosis was found at the pylorus from known invasive adenocarcinoma.  Hemoglobin currently at 8.1.  Continue PPI.  Patient started on Carafate per general surgery.  Patient likely to have an amputation done as he is contemplating due to his gangrenous foot.  Hemoglobin at 8.1.  Transfuse 2 units packed red blood cells in anticipation of amputation and to medically optimize patient.   Candida esophagitis Status post full course of Diflucan.     Chronic bilateral pulmonary embolismstarted Lovenox Lovenox discontinued the morning of 11/10/2018, patient placed on heparin due to bright red blood per rectum.  Per oncology.  Anemia of chronic disease Status post multiple blood transfusions25UNITS blood transfusions Had bloody bowel movements over the past few days initially felt secondary to constipation.  Hemoglobin was 7.  Status  post 1 unit packed red blood cells.  Hemoglobin currently at 8.1.  Will transfuse 2 units packed red blood cells in anticipation of probable amputation in the next few days for gangrenous foot.  Follow H&H.   Hypokalemia/hypomagnesemia -Potassium repleted and at 4.2.  Magnesium at 1.8.   Sepsis secondary to H CAP and Pseudomonas urinary tract infection -Status post full course of IV vancomycin IV cefepime. PICC line pulled out 1/26.  Ischemic right foot with right popliteal and tibial artery embolus/gangrenous right foot -Status post embolectomy. Complicated by compartment syndrome requiring fasciotomy.  Patient's right foot is now gangrenous and tender to palpation.  Vascular surgery was reconsulted back and patient seen in consultation by Dr. Scot Dock who assessed the patient and it was felt right foot is not salvageable and recommending BKA.  Patient currently not septic.  Patient currently contemplating amputation and once he is decided if he decides to have amputation will inform vascular surgery and patient will likely need to be transferred to [facility] for amputation.  Continue anticoagulation with heparin for now.    Cardiomyopathy, combined systolic and diastolic CHF, chronic -Patient currently euvolemic.  Follow.   Uncontrolled diabetes mellitus type 2 -Hemoglobin A1c 18.2. CBG of 190 this morning.  Continue Lantus and sliding scale insulin.  Follow.   Severe protein calorie malnutrition Patient diet was advanced to a soft diet on 11/12/2018 which he seems to be tolerating.  TPN has been weaned off.   Generalized weakness-physical therapy/Occupational Therapy-recommending patient seen by PT/OT who are recommending SNF.  Social work following.  Severe protein calorie malnutrition Patient was on TPN.  Patient tolerating soft diet.  TPN has been weaned off.   DVT  prophylaxis: Heparin Code Status: Full Family Communication: Updated patient.  No family at  bedside. Disposition Plan: To be determined.  Patient homeless and likely not a suitable candidate for shelter at this time due to ongoing medical issues.   Consultants:   Gastroenterology  Oncology  Conventional radiology  Pulmonary and critical care medicine  Vascular surgery  Palliative care  General surgery  Cardiology  Procedures:  Laparoscopy, open gastrojejunostomy per Dr. Hassell Done 11/04/2018  Endoscopy 09/07/2018  EUS 09/16/2018  Chemo-port placement 10/12/2018  Repeat endoscopy 10/29/2018--per Dr. Loletha Carrow III  Repeat upper endoscopy 11/12/2018 per Dr. Hilarie Fredrickson  2 units packed red blood cells pending 11/15/2018.  Antimicrobials:   IV vancomycin--finished course  IV cefepime--- finished course   Subjective: Patient in bed.  Tolerating current diet.  Denies any chest pain or shortness of breath.  No rectal bleeding.  Still contemplating amputation.    Objective: Vitals:   11/14/18 0632 11/14/18 1247 11/14/18 2109 11/15/18 0638  BP: (!) 125/92 112/87 103/70 123/90  Pulse: (!) 105 99 (!) 103 (!) 102  Resp: 18 16 17 17   Temp: 98 F (36.7 C) 98 F (36.7 C) 97.9 F (36.6 C) 97.9 F (36.6 C)  TempSrc: Oral Oral Oral Oral  SpO2: 100% 100% 98% 100%  Weight:      Height:        Intake/Output Summary (Last 24 hours) at 11/15/2018 1112 Last data filed at 11/15/2018 1037 Gross per 24 hour  Intake 1120.7 ml  Output 1575 ml  Net -454.3 ml   Filed Weights   11/02/18 1806 11/04/18 0831 11/12/18 0742  Weight: 56.2 kg 56.2 kg 56.2 kg    Examination:  General exam: NAD Respiratory system: Lungs clear to auscultation bilaterally.  No wheezes, no crackles, no rhonchi.  Normal respiratory effort.  Cardiovascular system: Regular rate rhythm no murmurs rubs or gallops.  No JVD.  No lower extremity edema. Gastrointestinal system: Abdomen is soft, nontender, nondistended, positive bowel sounds.  Midabdominal surgical wound clean dry and intact.  Steri-Strips in place.   Staples have been removed.  Central nervous system: Alert and oriented. No focal neurological deficits. Extremities: Gangrenous right foot. Symmetric 5 x 5 power. Skin: No rashes, lesions or ulcers Psychiatry: Judgement and insight appear normal. Mood & affect appropriate.     Data Reviewed: I have personally reviewed following labs and imaging studies  CBC: Recent Labs  Lab 11/12/18 0334 11/13/18 0800 11/14/18 0511 11/15/18 0500 11/15/18 0700  WBC 8.1 7.4 8.6 PATIENT IDENTIFICATION ERROR. PLEASE DISREGARD RESULTS. ACCOUNT WILL BE CREDITED. 8.1  HGB 8.5* 8.0* 8.4* PATIENT IDENTIFICATION ERROR. PLEASE DISREGARD RESULTS. ACCOUNT WILL BE CREDITED. 8.1*  HCT 27.9* 26.1* 27.1* PATIENT IDENTIFICATION ERROR. PLEASE DISREGARD RESULTS. ACCOUNT WILL BE CREDITED. 25.8*  MCV 92.1 93.9 93.8 PATIENT IDENTIFICATION ERROR. PLEASE DISREGARD RESULTS. ACCOUNT WILL BE CREDITED. 92.8  PLT 375 341 358 PATIENT IDENTIFICATION ERROR. PLEASE DISREGARD RESULTS. ACCOUNT WILL BE CREDITED. 016   Basic Metabolic Panel: Recent Labs  Lab 11/09/18 0338 11/10/18 0559 11/11/18 0010 11/12/18 0334 11/13/18 0800 11/14/18 0511 11/15/18 0500 11/15/18 0700  NA 134* 132* 132* 136 134* 134* PATIENT IDENTIFICATION ERROR. PLEASE DISREGARD RESULTS. ACCOUNT WILL BE CREDITED. 135  K 4.1 4.5 4.5 4.6 4.3 4.6 PATIENT IDENTIFICATION ERROR. PLEASE DISREGARD RESULTS. ACCOUNT WILL BE CREDITED. 4.2  CL 99 99 98 100 103 101 PATIENT IDENTIFICATION ERROR. PLEASE DISREGARD RESULTS. ACCOUNT WILL BE CREDITED. 101  CO2 29 26 29 29 28 24  PATIENT IDENTIFICATION ERROR. PLEASE DISREGARD RESULTS. ACCOUNT WILL  BE CREDITED. 25  GLUCOSE 128* 152* 243* 142* 75 132* PATIENT IDENTIFICATION ERROR. PLEASE DISREGARD RESULTS. ACCOUNT WILL BE CREDITED. 227*  BUN 17 16 16 19 16 13  PATIENT IDENTIFICATION ERROR. PLEASE DISREGARD RESULTS. ACCOUNT WILL BE CREDITED. 12  CREATININE 0.42* 0.47* 0.52* 0.40* 0.38* 0.42* PATIENT IDENTIFICATION ERROR. PLEASE  DISREGARD RESULTS. ACCOUNT WILL BE CREDITED. 0.48*  CALCIUM 8.0* 8.0* 8.1* 8.3* 8.0* 8.2* PATIENT IDENTIFICATION ERROR. PLEASE DISREGARD RESULTS. ACCOUNT WILL BE CREDITED. 8.1*  MG 1.9 2.0 2.0 2.1 1.8  --   --   --   PHOS 3.9 4.1 3.9 3.9 3.1  --   --   --    GFR: Estimated Creatinine Clearance: 81 mL/min (A) (by C-G formula based on SCr of 0.48 mg/dL (L)). Liver Function Tests: Recent Labs  Lab 11/09/18 0338 11/10/18 0559  AST 23 50*  ALT 24 45*  ALKPHOS 64 73  BILITOT 0.2* 0.2*  PROT 6.1* 6.3*  ALBUMIN 1.7* 1.8*   No results for input(s): LIPASE, AMYLASE in the last 168 hours. No results for input(s): AMMONIA in the last 168 hours. Coagulation Profile: No results for input(s): INR, PROTIME in the last 168 hours. Cardiac Enzymes: No results for input(s): CKTOTAL, CKMB, CKMBINDEX, TROPONINI in the last 168 hours. BNP (last 3 results) No results for input(s): PROBNP in the last 8760 hours. HbA1C: No results for input(s): HGBA1C in the last 72 hours. CBG: Recent Labs  Lab 11/14/18 0728 11/14/18 1212 11/14/18 1706 11/14/18 2112 11/15/18 0737  GLUCAP 152* 157* 138* 108* 190*   Lipid Profile: No results for input(s): CHOL, HDL, LDLCALC, TRIG, CHOLHDL, LDLDIRECT in the last 72 hours. Thyroid Function Tests: No results for input(s): TSH, T4TOTAL, FREET4, T3FREE, THYROIDAB in the last 72 hours. Anemia Panel: No results for input(s): VITAMINB12, FOLATE, FERRITIN, TIBC, IRON, RETICCTPCT in the last 72 hours. Sepsis Labs: No results for input(s): PROCALCITON, LATICACIDVEN in the last 168 hours.  No results found for this or any previous visit (from the past 240 hour(s)).       Radiology Studies: No results found.      Scheduled Meds: . sodium chloride   Intravenous Once  . acetaminophen  1,000 mg Oral Q8H  . acetaminophen  650 mg Oral Once  . diphenhydrAMINE  25 mg Oral Once  . feeding supplement (ENSURE ENLIVE)  237 mL Oral QID  . furosemide  20 mg Intravenous  Once  . heparin lock flush  500 Units Intracatheter Q30 days  . insulin aspart  0-20 Units Subcutaneous TID WC  . insulin aspart  0-5 Units Subcutaneous QHS  . pantoprazole  40 mg Oral BID  . sucralfate  1 g Oral TID WC & HS   Continuous Infusions: . sodium chloride 10 mL/hr at 11/12/18 0400  . heparin 1,450 Units/hr (11/15/18 0416)     LOS: 73 days    Time spent: 35 minutes    Irine Seal, MD [facility]  If 7PM-7AM, please contact night-coverage [email or web address] 11/15/2018, 11:12 AM

## 2018-11-15 NOTE — Progress Notes (Signed)
Patient ID: Ryan Medina, male   DOB: [birth year], 58 y.o.   MRN: [id number]  This NP visited patient at the bedside as a follow up for palliative medicine needs and emotional support.  [name] is alert and oriented.    Patient has had a long  (73 days) complicated hospitalization; inoperable pancreatic adenocarcinoma,  HTN, DM, COPD, ischemic right leg, GIB, PE/ DVT, s/p gastric outlet surgery, malnutrition,  substance abuse all compounded by dense psychosocial issues ((homeless  and minimal social support)  Patient now faces treatment options regarding gangrenous  right foot.  Vascular surgery is recommending right BKA.  Today for the first time I meet his sister/Tonya Harris.  We had conversation regarding diagnosis, prognosis, treatment options, GOCs, and anticipatory care needs. Tonya visits her brother regularly however she is adamant that it is not possible for him to live with her in any capacity on discharge.  Patient verbalizes an understanding of the seriousness of his disease but remains hopeful for improvement. "I am not giving up".        Created space and opportunity for Mr. Stclair to continue to explore his thoughts and feelings about his current medical situation.  He tells me he is open to "whatever they suggest".    His goal is continued quality of life.    We discussed human mortality and the limitations of medical interventions to prolong life when the body begins to fail to thrive.  Emotional support offered        Questions and concerns addressed      Palliative medicine team will continue to support holistically.  Total time spent on the unit was 35 minutes  Greater than 50% of the time was spent in counseling and coordination of care  Wadie Lessen NP  Palliative Medicine Team Team Phone # [phone] Pager [phone]

## 2018-11-15 NOTE — Discharge Instructions (Addendum)
[facility]      [facility] °[phone] ° °OPEN ABDOMINAL SURGERY: POST OP INSTRUCTIONS ° °Always review your discharge instruction sheet given to you by the facility where your surgery was performed. ° °IF YOU HAVE DISABILITY OR FAMILY LEAVE FORMS, YOU MUST BRING THEM TO THE OFFICE FOR PROCESSING.  PLEASE DO NOT GIVE THEM TO YOUR DOCTOR. ° °1. A prescription for pain medication may be given to you upon discharge.  Take your pain medication as prescribed, if needed.  If narcotic pain medicine is not needed, then you may take acetaminophen (Tylenol) or ibuprofen (Advil) as needed. °2. Take your usually prescribed medications unless otherwise directed. °3. If you need a refill on your pain medication, please contact your pharmacy. They will contact our office to request authorization.  Prescriptions will not be filled after 5pm or on week-ends. °4. You should follow a light diet the first few days after arrival home, such as soup and crackers, pudding, etc.unless your doctor has advised otherwise. A high-fiber, low fat diet can be resumed as tolerated.   Be sure to include lots of fluids daily. Most patients will experience some swelling and bruising on the chest and neck area.  Ice packs will help.  Swelling and bruising can take several days to resolve °5. Most patients will experience some swelling and bruising in the area of the incision. Ice pack will help. Swelling and bruising can take several days to resolve..  °6. It is common to experience some constipation if taking pain medication after surgery.  Increasing fluid intake and taking a stool softener will usually help or prevent this problem from occurring.  A mild laxative (Milk of Magnesia or Miralax) should be taken according to package directions if there are no bowel movements after 48 hours. °7.  You may have steri-strips (small skin tapes) in place directly over the incision.  These strips should be left on the skin for 7-10 days.  If your  surgeon used skin glue on the incision, you may shower in 24 hours.  The glue will flake off over the next 2-3 weeks.  Any sutures or staples will be removed at the office during your follow-up visit. You may find that a light gauze bandage over your incision may keep your staples from being rubbed or pulled. You may shower and replace the bandage daily. °8. ACTIVITIES:  You may resume regular (light) daily activities beginning the next day--such as daily self-care, walking, climbing stairs--gradually increasing activities as tolerated.  You may have sexual intercourse when it is comfortable.  Refrain from any heavy lifting or straining until approved by your doctor. °a. You may drive when you no longer are taking prescription pain medication, you can comfortably wear a seatbelt, and you can safely maneuver your car and apply brakes °b. Return to Work: ___________________________________ °9. You should see your doctor in the office for a follow-up appointment approximately two weeks after your surgery.  Make sure that you call for this appointment within a day or two after you arrive home to insure a convenient appointment time. °OTHER INSTRUCTIONS:  °_____________________________________________________________ °_____________________________________________________________ ° °WHEN TO CALL YOUR DOCTOR: °1. Fever over 101.0 °2. Inability to urinate °3. Nausea and/or vomiting °4. Extreme swelling or bruising °5. Continued bleeding from incision. °6. Increased pain, redness, or drainage from the incision. °7. Difficulty swallowing or breathing °8. Muscle cramping or spasms. °9. Numbness or tingling in hands or feet or around lips. ° °The clinic staff is available to   answer your questions during regular business hours.  Please dont hesitate to call and ask to speak to one of the nurses if you have concerns.  For further questions, please visit [email or web address]   Information on my medicine - ELIQUIS  (apixaban)  This medication education was reviewed with me or my healthcare representative as part of my discharge preparation.  The pharmacist that spoke with me during my hospital stay was:  Brain Hilts, [facility]  Why was Eliquis prescribed for you? Eliquis was prescribed to treat blood clots that may have been found in the veins of your legs (deep vein thrombosis) or in your lungs (pulmonary embolism) and to reduce the risk of them occurring again.  What do You need to know about Eliquis ? The dose is ONE 5 mg tablet taken TWICE daily as prescribed by your provider.  Eliquis may be taken with or without food.   Try to take the dose about the same time in the morning and in the evening. If you have difficulty swallowing the tablet whole please discuss with your pharmacist how to take the medication safely.  Take Eliquis exactly as prescribed and DO NOT stop taking Eliquis without talking to the doctor who prescribed the medication.  Stopping may increase your risk of developing a new blood clot.  Refill your prescription before you run out.  After discharge, you should have regular check-up appointments with your healthcare provider that is prescribing your Eliquis.    What do you do if you miss a dose? If a dose of ELIQUIS is not taken at the scheduled time, take it as soon as possible on the same day and twice-daily administration should be resumed. The dose should not be doubled to make up for a missed dose.  Important Safety Information A possible side effect of Eliquis is bleeding. You should call your healthcare provider right away if you experience any of the following: ? Bleeding from an injury or your nose that does not stop. ? Unusual colored urine (red or dark brown) or unusual colored stools (red or black). ? Unusual bruising for unknown reasons. ? A serious fall or if you hit your head (even if there is no bleeding).  Some medicines may interact with Eliquis  and might increase your risk of bleeding or clotting while on Eliquis. To help avoid this, consult your healthcare provider or pharmacist prior to using any new prescription or non-prescription medications, including herbals, vitamins, non-steroidal anti-inflammatory drugs (NSAIDs) and supplements.  This website has more information on Eliquis (apixaban): http://www.eliquis.com/eliquis/home

## 2018-11-15 NOTE — Progress Notes (Signed)
PT Cancellation Note  Patient Details Name: Laszlo Ellerby E[place]ahr MRN: [id number] DOB: 04-13-1961   Cancelled Treatment:    Reason Eval/Treat Not Completed: Pt declined participation with PT on today 2* having to make a difficult decision after consulting with vascular surgery. He requested PT check back another day.   Weston Anna, PT Acute Rehabilitation Services Pager: [phone] Office: [phone]

## 2018-11-15 NOTE — Progress Notes (Signed)
[facility] Progress Note  3 Days Post-Op  Subjective: CC-  Resting comfortably this morning. Overall feeling well. Abdominal pain well controlled. States that he ate almost all of his breakfast. Drinking 1-3 Ensure daily but is getting tired of the taste. Reports having 3 soft/somewhat loose BMs yesterday.  Gangrene right foot, seen by vascular yesterday. Recommending right below the knee amputation.  Objective: Vital signs in last 24 hours: Temp:  [97.9 F (36.6 C)-98 F (36.7 C)] 97.9 F (36.6 C) (02/24 9242) Pulse Rate:  [99-103] 102 (02/24 0638) Resp:  [16-17] 17 (02/24 6834) BP: (103-123)/(70-90) 123/90 (02/24 1962) SpO2:  [98 %-100 %] 100 % (02/24 2297) Last BM Date: 11/14/18  Intake/Output from previous day: 02/23 0701 - 02/24 0700 In: 1120.7 [P.O.:240; I.V.:880.7] Out: 1925 [Urine:1925] Intake/Output this shift: Total I/O In: 240 [P.O.:240] Out: 200 [Urine:200]  PE: Gen:  Alert, NAD, pleasant HEENT: EOM's intact, pupils equal and round Pulm:  effort normal. Port right chest Abd: Soft, NT/ND, +BS, midline incision cdi with staples intact and no erythema or drainage Skin: warm and dry  Lab Results:  Recent Labs    11/15/18 0500 11/15/18 0700  WBC PATIENT IDENTIFICATION ERROR. PLEASE DISREGARD RESULTS. ACCOUNT WILL BE CREDITED. 8.1  HGB PATIENT IDENTIFICATION ERROR. PLEASE DISREGARD RESULTS. ACCOUNT WILL BE CREDITED. 8.1*  HCT PATIENT IDENTIFICATION ERROR. PLEASE DISREGARD RESULTS. ACCOUNT WILL BE CREDITED. 25.8*  PLT PATIENT IDENTIFICATION ERROR. PLEASE DISREGARD RESULTS. ACCOUNT WILL BE CREDITED. 343   BMET Recent Labs    11/15/18 0500 11/15/18 0700  NA PATIENT IDENTIFICATION ERROR. PLEASE DISREGARD RESULTS. ACCOUNT WILL BE CREDITED. 135  K PATIENT IDENTIFICATION ERROR. PLEASE DISREGARD RESULTS. ACCOUNT WILL BE CREDITED. 4.2  CL PATIENT IDENTIFICATION ERROR. PLEASE DISREGARD RESULTS. ACCOUNT WILL BE CREDITED. 101  CO2 PATIENT  IDENTIFICATION ERROR. PLEASE DISREGARD RESULTS. ACCOUNT WILL BE CREDITED. 25  GLUCOSE PATIENT IDENTIFICATION ERROR. PLEASE DISREGARD RESULTS. ACCOUNT WILL BE CREDITED. 227*  BUN PATIENT IDENTIFICATION ERROR. PLEASE DISREGARD RESULTS. ACCOUNT WILL BE CREDITED. 12  CREATININE PATIENT IDENTIFICATION ERROR. PLEASE DISREGARD RESULTS. ACCOUNT WILL BE CREDITED. 0.48*  CALCIUM PATIENT IDENTIFICATION ERROR. PLEASE DISREGARD RESULTS. ACCOUNT WILL BE CREDITED. 8.1*   PT/INR No results for input(s): LABPROT, INR in the last 72 hours. CMP     Component Value Date/Time   NA 135 11/15/2018 0700   K 4.2 11/15/2018 0700   CL 101 11/15/2018 0700   CO2 25 11/15/2018 0700   GLUCOSE 227 (H) 11/15/2018 0700   BUN 12 11/15/2018 0700   CREATININE 0.48 (L) 11/15/2018 0700   CREATININE 0.92 05/15/2016 1038   CALCIUM 8.1 (L) 11/15/2018 0700   PROT 6.3 (L) 11/10/2018 0559   ALBUMIN 1.8 (L) 11/10/2018 0559   AST 50 (H) 11/10/2018 0559   ALT 45 (H) 11/10/2018 0559   ALKPHOS 73 11/10/2018 0559   BILITOT 0.2 (L) 11/10/2018 0559   GFRNONAA >60 11/15/2018 0700   GFRAA >60 11/15/2018 0700   Lipase     Component Value Date/Time   LIPASE 42 08/22/2018 1932       Studies/Results: No results found.  Anti-infectives: Anti-infectives (From admission, onward)   Start     Dose/Rate Route Frequency Ordered Stop   11/05/18 0930  cefoTEtan (CEFOTAN) 2 g in sodium chloride 0.9 % 100 mL IVPB  Status:  Discontinued     2 g 200 mL/hr over 30 Minutes Intravenous  Once 11/04/18 1237 11/04/18 1323   11/04/18 2130  cefoTEtan (CEFOTAN) 2 g in sodium chloride 0.9 % 100 mL  IVPB     2 g 200 mL/hr over 30 Minutes Intravenous  Once 11/04/18 1323 11/04/18 2128   11/04/18 0815  cefoTEtan (CEFOTAN) 2 g in sodium chloride 0.9 % 100 mL IVPB     2 g 200 mL/hr over 30 Minutes Intravenous On call to O.R. 11/03/18 1135 11/04/18 0931   11/01/18 1000  fluconazole (DIFLUCAN) tablet 100 mg  Status:  Discontinued     100 mg Oral Daily  11/01/18 0828 11/04/18 1237   10/29/18 1600  fluconazole (DIFLUCAN) IVPB 100 mg  Status:  Discontinued     100 mg 50 mL/hr over 60 Minutes Intravenous Every 24 hours 10/29/18 1529 11/01/18 0828   10/11/18 2300  vancomycin (VANCOCIN) IVPB 750 mg/150 ml premix  Status:  Discontinued     750 mg 150 mL/hr over 60 Minutes Intravenous Every 12 hours 10/11/18 1037 10/13/18 1954   10/11/18 1500  ceFAZolin (ANCEF) IVPB 2g/100 mL premix     2 g 200 mL/hr over 30 Minutes Intravenous To Radiology 10/08/18 1348 10/12/18 1500   10/11/18 1200  ceFEPIme (MAXIPIME) 1 g in sodium chloride 0.9 % 100 mL IVPB  Status:  Discontinued     1 g 200 mL/hr over 30 Minutes Intravenous Every 8 hours 10/11/18 1037 10/13/18 1954   10/11/18 1045  vancomycin (VANCOCIN) 1,250 mg in sodium chloride 0.9 % 250 mL IVPB     1,250 mg 166.7 mL/hr over 90 Minutes Intravenous  Once 10/11/18 1037 10/11/18 2057   10/07/18 1600  ceFEPIme (MAXIPIME) 1 g in sodium chloride 0.9 % 100 mL IVPB     1 g 200 mL/hr over 30 Minutes Intravenous Every 8 hours 10/07/18 1452 10/08/18 0105   10/06/18 1000  fluconazole (DIFLUCAN) tablet 100 mg  Status:  Discontinued     100 mg Oral Daily 10/06/18 0709 10/11/18 0834   10/02/18 2100  vancomycin (VANCOCIN) IVPB 750 mg/150 ml premix  Status:  Discontinued     750 mg 150 mL/hr over 60 Minutes Intravenous Every 12 hours 10/02/18 0812 10/04/18 1420   10/02/18 0900  vancomycin (VANCOCIN) 1,250 mg in sodium chloride 0.9 % 250 mL IVPB     1,250 mg 166.7 mL/hr over 90 Minutes Intravenous  Once 10/02/18 0812 10/02/18 1132   10/02/18 0900  ceFEPIme (MAXIPIME) 1 g in sodium chloride 0.9 % 100 mL IVPB  Status:  Discontinued     1 g 200 mL/hr over 30 Minutes Intravenous Every 8 hours 10/02/18 0812 10/07/18 0925   10/02/18 0815  ceFEPIme (MAXIPIME) 1 g in sodium chloride 0.9 % 100 mL IVPB  Status:  Discontinued     1 g 200 mL/hr over 30 Minutes Intravenous Every 8 hours 10/02/18 0800 10/02/18 0812   10/02/18  0800  ceFEPIme (MAXIPIME) 2 g in sodium chloride 0.9 % 100 mL IVPB  Status:  Discontinued     2 g 200 mL/hr over 30 Minutes Intravenous  Once 10/02/18 0757 10/02/18 0800   10/02/18 0800  metroNIDAZOLE (FLAGYL) IVPB 500 mg  Status:  Discontinued     500 mg 100 mL/hr over 60 Minutes Intravenous Every 8 hours 10/02/18 0757 10/02/18 0800   10/02/18 0800  vancomycin (VANCOCIN) IVPB 1000 mg/200 mL premix  Status:  Discontinued     1,000 mg 200 mL/hr over 60 Minutes Intravenous  Once 10/02/18 0757 10/02/18 0800   09/27/18 0000  ceFAZolin (ANCEF) IVPB 2g/100 mL premix     2 g 200 mL/hr over 30 Minutes Intravenous To Radiology 09/25/18  6387 09/28/18 0000   09/10/18 2130  ceFAZolin (ANCEF) IVPB 2g/100 mL premix     2 g 200 mL/hr over 30 Minutes Intravenous Every 8 hours 09/10/18 1634 09/11/18 0700   09/10/18 0800  ceFAZolin (ANCEF) IVPB 1 g/50 mL premix    Note to Pharmacy:  Send with pt to OR   1 g 100 mL/hr over 30 Minutes Intravenous On call 09/09/18 0756 09/10/18 1407   09/07/18 1300  erythromycin 250 mg in sodium chloride 0.9 % 100 mL IVPB     250 mg 100 mL/hr over 60 Minutes Intravenous Once 09/07/18 1125 09/07/18 2351   09/06/18 0000  ceFAZolin (ANCEF) IVPB 1 g/50 mL premix    Note to Pharmacy:  Send with pt to OR   1 g 100 mL/hr over 30 Minutes Intravenous On call 09/05/18 0849 09/05/18 0952   09/03/18 2245  ceFAZolin (ANCEF) IVPB 2g/100 mL premix     2 g 200 mL/hr over 30 Minutes Intravenous Every 8 hours 09/03/18 2236 09/04/18 1444   09/03/18 1615  ceFAZolin (ANCEF) IVPB 2g/100 mL premix  Status:  Discontinued     2 g 200 mL/hr over 30 Minutes Intravenous On call to O.R. 09/03/18 1600 09/03/18 2035       Assessment/Plan Pancreatic cancer with partial duodenal obstruction, Same with massive gastric varices S/p Laparoscopy, Open gastrojejunostomy 2/13 Dr. Hassell Done - POD 11 - Tolerating diet and having bowel function - Staples removed and steri strips applied. Ok to shower with  wound open. Continue PPI and carafate. General surgery will sign off, please call us with any concerns. Patient knows to call our office for f/u appointment once discharged from the hospital. Discharge instructions on AVS.  ID - none currently FEN - soft diet, Ensure QID VTE - SCDs, IV heparin Foley - none Follow up - Dr. Hassell Done   LOS: 91 days    Wellington Hampshire , [facility] 11/15/2018, 8:31 AM Pager: [phone] Mon-Thurs 7:00 am-4:30 pm Fri 7:00 am -11:30 AM Sat-Sun 7:00 am-11:30 am

## 2018-11-15 NOTE — Progress Notes (Signed)
[place] for Heparin  Indication: PE, DVT  Allergies  Allergen Reactions  . Lisinopril Anaphylaxis   Patient Measurements: Height: 5\' 11"  (180.3 cm) Weight: 123 lb 14.4 oz (56.2 kg) IBW/kg (Calculated) : 75.3 Heparin Dosing Weight: 66.7  Vital Signs: Temp: 97.9 F (36.6 C) (02/24 0638) Temp Source: Oral (02/24 2353) BP: 123/90 (02/24 6144) Pulse Rate: 102 (02/24 0638)  Labs: Recent Labs    11/13/18 2100 11/14/18 0511 11/15/18 0500 11/15/18 0700  HGB  --  8.4* PATIENT IDENTIFICATION ERROR. PLEASE DISREGARD RESULTS. ACCOUNT WILL BE CREDITED. 8.1*  HCT  --  27.1* PATIENT IDENTIFICATION ERROR. PLEASE DISREGARD RESULTS. ACCOUNT WILL BE CREDITED. 25.8*  PLT  --  358 PATIENT IDENTIFICATION ERROR. PLEASE DISREGARD RESULTS. ACCOUNT WILL BE CREDITED. 343  HEPARINUNFRC 0.32 0.32  --  0.52  CREATININE  --  0.42* PATIENT IDENTIFICATION ERROR. PLEASE DISREGARD RESULTS. ACCOUNT WILL BE CREDITED. 0.48*   Estimated Creatinine Clearance: 81 mL/min (A) (by C-G formula based on SCr of 0.48 mg/dL (L)).  Assessment: 58 yo male with arterial embolus s/p embolectomy. Patient had development of hematoma with compartment syndrome as well as hemorrhagic shock secondary to a bleeding duodenal ulcer.   Events this admission on 09/03/18:  LLE DVT/ischemia s/p urgent left popliteal artery embolectomy 12/13 >> RLE hematoma on 12/15 am s/p evacuation in OR.Heparin stopped 2/2 drop in hgb, hematemesis. -Heparin cautiously started 12/22,had few episodes of hematemesis & nose bleed 12/24> stopped heparin per dr. Candiss Norse S/p EUS 12/26:  pancreatic mass eroding into duodenum -VVS notes ikely too high risk for [facility]  12/28- Dr. Candiss Norse noted Continue PPI and Hold Heparin indefinitely  2/5: pharmacy consulted to dose heparin with no bolus by Dr Marin Olp for acute PE. Previously on 12/24 his heparin level was therapeutic at 0.35 on 1200 units/hr. 2/7: Heparin off at 09 am for EDG,  heparin to resume after NG placed per GI instructions. KUB 1629> NG in stomach 2/13: Expl lap & Gastrojejunostomy- not able to use jejunostomy for enteral feeds. Heparin resumed at 2000 at rate of 1600 units/hr 2/14 heparin tx to lovenox (heparin at 1700 units/hr) 2/19 lovenox to heparin in anticipation of colonoscopy 2/21 heparin held at 0300 for EGD, HL 0.56 on 1400 units/hr  11/15/2018   Heparin level continues to be therapeutic on current IV heparin rate of 1450 units/hr  CBC 8.1, Plts stable  No reported bleeding   Goal of Therapy:  Monitor platelets by anticoagulation protocol: Yes Heparin level 0.3-0.5 units/ml   Plan:   Continue IV heparin drip at current rate of 1450 units/hr  Daily CBC and HL   Monitor for bleeding  Heparin until all invasive procedures completed- ? R BKA  Eudelia Bunch, Pharm.D [phone] 11/15/2018 7:45 AM

## 2018-11-16 DIAGNOSIS — I743 Embolism and thrombosis of arteries of the lower extremities: Principal | ICD-10-CM

## 2018-11-16 LAB — CBC
HCT: 31.9 % — ABNORMAL LOW (ref 39.0–52.0)
Hemoglobin: 10.3 g/dL — ABNORMAL LOW (ref 13.0–17.0)
MCH: 28.9 pg (ref 26.0–34.0)
MCHC: 32.3 g/dL (ref 30.0–36.0)
MCV: 89.6 fL (ref 80.0–100.0)
Platelets: 363 10*3/uL (ref 150–400)
RBC: 3.56 MIL/uL — AB (ref 4.22–5.81)
RDW: 16.1 % — ABNORMAL HIGH (ref 11.5–15.5)
WBC: 8.8 10*3/uL (ref 4.0–10.5)
nRBC: 0 % (ref 0.0–0.2)

## 2018-11-16 LAB — GLUCOSE, CAPILLARY
GLUCOSE-CAPILLARY: 94 mg/dL (ref 70–99)
Glucose-Capillary: 218 mg/dL — ABNORMAL HIGH (ref 70–99)
Glucose-Capillary: 63 mg/dL — ABNORMAL LOW (ref 70–99)
Glucose-Capillary: 214 mg/dL — ABNORMAL HIGH (ref 70–99)
Glucose-Capillary: 130 mg/dL — ABNORMAL HIGH (ref 70–99)

## 2018-11-16 LAB — TYPE AND SCREEN
ABO/RH(D): B POS
Antibody Screen: NEGATIVE
UNIT DIVISION: 0
Unit division: 0

## 2018-11-16 LAB — BASIC METABOLIC PANEL
ANION GAP: 8 (ref 5–15)
BUN: 11 mg/dL (ref 6–20)
CALCIUM: 8.4 mg/dL — AB (ref 8.9–10.3)
CO2: 27 mmol/L (ref 22–32)
Chloride: 101 mmol/L (ref 98–111)
Creatinine, Ser: 0.48 mg/dL — ABNORMAL LOW (ref 0.61–1.24)
GFR calc Af Amer: >60 mL/min (ref >60)
GFR calc non Af Amer: >60 mL/min (ref >60)
Glucose, Bld: 154 mg/dL — ABNORMAL HIGH (ref 70–99)
Potassium: 4 mmol/L (ref 3.5–5.1)
Sodium: 136 mmol/L (ref 135–145)

## 2018-11-16 LAB — BPAM RBC
Blood Product Expiration Date: 202003112359
Blood Product Expiration Date: 202003112359
ISSUE DATE / TIME: 202002241529
ISSUE DATE / TIME: 202002241806
Unit Type and Rh: 7300
Unit Type and Rh: 7300

## 2018-11-16 LAB — HEPARIN LEVEL (UNFRACTIONATED): HEPARIN UNFRACTIONATED: 0.67 [IU]/mL (ref 0.30–0.70)

## 2018-11-16 MED ORDER — HEPARIN (PORCINE) 25000 UT/250ML-% IV SOLN
1400.0000 [IU]/h | INTRAVENOUS | Status: DC
  Administered 2018-11-17: 1400 [IU]/h via INTRAVENOUS
  Filled 2018-11-16 (×2): qty 250

## 2018-11-16 MED ORDER — ENSURE ENLIVE PO LIQD
237.0000 mL | Freq: Three times a day (TID) | ORAL | Status: DC
  Administered 2018-11-16 – 2018-12-11 (×20): 237 mL via ORAL

## 2018-11-16 NOTE — Progress Notes (Signed)
Nutrition Follow-up  DOCUMENTATION CODES:   Severe malnutrition in context of chronic illness, Underweight  INTERVENTION:   -Decrease Ensure Enlive to TID, each supplement provides 350 kcal and 20 grams of protein -Continue Magic cup BID with meals, each supplement provides 290 kcal and 9 grams of protein  NUTRITION DIAGNOSIS:   Severe Malnutrition related to chronic illness(Pancreatic cancer, DM and CHF) as evidenced by severe muscle depletion, severe fat depletion, percent weight loss.  Ongoing.  GOAL:   Patient will meet greater than or equal to 90% of their needs  Progressing.  MONITOR:   PO intake, Supplement acceptance, Labs, Weight trends, I & O's  ASSESSMENT:   58 y.o male with PMH: recent dx pancreatic cancer, DM, CHF, crack cocaine use, emboli of legs, smoker. Pt admitted with admitted with DKA and GI bleed.   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 2/8 -NGT pulled out, started on CLD 2/11: [place] Count completed, pt meeting 65-69% of estimated needs 2/13:LAPAROSCOPY [place] 2/14: TPN initiated via port 2/21: Diet advanced to full liquids then to soft diet 2/23: TPN discontinued  Patient is eating much better, 100% of soft diet. Pt is drinking Ensure supplements but not QID. Will decrease  now that pt is on a solid diet and tolerating.  Pt with new non-pressure wound on right foot. Per oncology note, pt unable to receive chemo d/t risk of sepsis from foot. May need BKA.  Weight obtained 2/21. Weight is stable for the past month of this admission.   Medications reviewed.  Labs reviewed: CBGs: 63-218  Diet Order:   Diet Order            DIET SOFT Room service appropriate? Yes; Fluid consistency: Thin  Diet effective now              EDUCATION NEEDS:   Education needs have been addressed  Skin:  Skin Assessment: Skin Integrity Issues: Skin Integrity Issues:: Stage II, Other (Comment) Stage II: coccyx Other: non-pressure wound on right foot  Last BM:  2/24  Height:   Ht Readings from Last 1 Encounters:  11/12/18 5\' 11"  (1.803 m)    Weight:   Wt Readings from Last 1 Encounters:  11/12/18 56.2 kg    Ideal Body Weight:  78 kg  BMI:  Body mass index is 17.28 kg/m.  Estimated Nutritional Needs:   Kcal:  2000-2200 kcal  Protein:  100-110 grams  Fluid:  >/= 2L/day  Clayton Bibles, MS, RD, LDN [facility] Inpatient Clinical Dietitian Pager: [phone] After Hours Pager: [phone]

## 2018-11-16 NOTE — Progress Notes (Signed)
[place] for Heparin  Indication: PE, DVT  Allergies  Allergen Reactions  . Lisinopril Anaphylaxis   Patient Measurements: Height: 5\' 11"  (180.3 cm) Weight: 123 lb 14.4 oz (56.2 kg) IBW/kg (Calculated) : 75.3 Heparin Dosing Weight: 66.7  Vital Signs: Temp: 98.4 F (36.9 C) (02/25 0545) Temp Source: Oral (02/25 0545) BP: 111/78 (02/25 0545) Pulse Rate: 103 (02/25 0545)  Labs: Recent Labs    11/14/18 0511 11/15/18 0500 11/15/18 0700 11/16/18 0109  HGB 8.4* PATIENT IDENTIFICATION ERROR. PLEASE DISREGARD RESULTS. ACCOUNT WILL BE CREDITED. 8.1* 10.3*  HCT 27.1* PATIENT IDENTIFICATION ERROR. PLEASE DISREGARD RESULTS. ACCOUNT WILL BE CREDITED. 25.8* 31.9*  PLT 358 PATIENT IDENTIFICATION ERROR. PLEASE DISREGARD RESULTS. ACCOUNT WILL BE CREDITED. 343 363  HEPARINUNFRC 0.32  --  0.52 0.67  CREATININE 0.42* PATIENT IDENTIFICATION ERROR. PLEASE DISREGARD RESULTS. ACCOUNT WILL BE CREDITED. 0.48* 0.48*   Estimated Creatinine Clearance: 81 mL/min (A) (by C-G formula based on SCr of 0.48 mg/dL (L)).  Assessment: 58 yo male with arterial embolus s/p embolectomy. Patient had development of hematoma with compartment syndrome as well as hemorrhagic shock secondary to a bleeding duodenal ulcer.   Events this admission on 09/03/18:  LLE DVT/ischemia s/p urgent left popliteal artery embolectomy 12/13 >> RLE hematoma on 12/15 am s/p evacuation in OR.Heparin stopped 2/2 drop in hgb, hematemesis. -Heparin cautiously started 12/22,had few episodes of hematemesis & nose bleed 12/24> stopped heparin per dr. Candiss Norse S/p EUS 12/26:  pancreatic mass eroding into duodenum -VVS notes ikely too high risk for [facility]  12/28- Dr. Candiss Norse noted Continue PPI and Hold Heparin indefinitely  2/5: pharmacy consulted to dose heparin with no bolus by Dr Marin Olp for acute PE. Previously on 12/24 his heparin level was therapeutic at 0.35 on 1200 units/hr. 2/7: Heparin off at 09 am for  EDG, heparin to resume after NG placed per GI instructions. KUB 1629> NG in stomach 2/13: Expl lap & Gastrojejunostomy- not able to use jejunostomy for enteral feeds. Heparin resumed at 2000 at rate of 1600 units/hr 2/14 heparin tx to lovenox (heparin at 1700 units/hr) 2/19 lovenox to heparin in anticipation of colonoscopy 2/21 heparin held at 0300 for EGD, HL 0.56 on 1400 units/hr  11/16/2018   Heparin level of 0.67 is above low goal on IV heparin rate of 1450 units/hr  CBC 8.1> 2 U PRBC>10.3, Plts stable  No reported bleeding   Goal of Therapy:  Monitor platelets by anticoagulation protocol: Yes Heparin level 0.3-0.5 units/ml   Plan:   decrease IV heparin drip to 1400 units/hr  Daily CBC and HL   Monitor for bleeding  Heparin until all invasive procedures completed- ? R BKA  Eudelia Bunch, Pharm.D [phone] 11/16/2018 8:59 AM

## 2018-11-16 NOTE — Progress Notes (Signed)
PROGRESS NOTE    Ryan Medina  GEZ:[id number] DOB: 04/27/1961 DOA: 09/03/2018 PCP: Patient, No Pcp Per    Brief Narrative: 58 year old with history of peripheral vascular disease, dilated cardiomyopathy, essential hypertension, diabetes mellitus type 2 initially came to the hospital with right lower extremity pain and was found to have ischemic leg requiring embolectomy on 12/13. He was placed on heparin drip subsequently developed right lower extremity hematoma requiring fasciotomy on 12/15. Unfortunately afterwards he decompensated with active GI bleed and was intubated and was moved to the ICU. He required embolization on 12/17. During the course of his hospitalization he is required total of 25 units of blood. Further evaluation showed pancreatic cancer with duodenal ulcer. Transferred to [facility] for radiation treatment to help with bleeding. Hospital course complicated by sepsis from healthcare acquired pneumonia and Pseudomonas UTI requiring treatment with vancomycin and cefepime.Patient found to have chronic PE therefore started on heparin drip per oncology. Repeat endoscopy showed worsening of gastric outlet obstruction. Initially NG tube placed by patient removed it. Right now on clear liquid diet   Assessment & Plan:   Principal Problem:   Popliteal artery embolus (HCC) Active Problems:   HCAP (healthcare-associated pneumonia)   Gangrene of right foot ([place])   DM (diabetes mellitus), type 2, uncontrolled ([place])   Essential hypertension   Dilated cardiomyopathy (HCC)   Chest pain   AAA (abdominal aortic aneurysm) (HCC)   Ischemic foot   Right leg claudication (HCC)   PVD (peripheral vascular disease) ([place])   Hemorrhagic shock ([place])   Nontraumatic ischemic infarction of muscle of right lower leg   Upper GI bleed   Cocaine abuse ([place])   Acute blood loss anemia   Chronic pain syndrome   Duodenal ulcer with hemorrhage   Pancreatic cancer ([place])   Goals of care,  counseling/discussion   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  Patient was transfused a unit packed red blood cells overnight.  Hemoglobin currently at 10.3 from 8.4 from 8.0 from 8.5 from 8.6 from 7.0.  Questionable etiology.  Patient being followed by oncology who anticipating starting patient on chemotherapy and requested that GI reassess patient for possible colonoscopy for further evaluation prior to initiation of chemotherapy.  Patient has been changed to IV heparin per oncology.  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  Continue PPI every 12 hours and Carafate.  Once PPI is transitioned to oral will need to be placed on liquid PPI per  GI recommendations.  Patient currently tolerating a soft diet.  Adenocarcinoma of pancreas Hemorrhagic shock due to GI bleed from duodenal ulcer,  stable Gastric outlet obstruction causing dysphagia and abdominal distention -CT scan showed increase in size of the mass with involvement of intra-abdominal arteries, deemed to be inoperable. Status post radiation. Per oncology plans for outpatient radiation and chemotherapy -Status post endoscopy 12/17, GDA embolization 12/17, status post radiation. -Continue PPI. -Endoscopy showed worsening of gastric outlet obstruction/pyloric stenosis secondary to malignancy. Candida esophagitis -S/Plaparoscopy open gastrojejunostomy no pancreatic mass amenable to biopsy -PICC WAS NOT PLACED AS HE HAD A PORT . Patient currently tolerating a soft diet.  TPN has been weaned off. -Patient was on clear liquids which was advanced as tolerated and patient currently tolerating a soft diet. Patient underwent upper endoscopy by Dr. Hilarie Fredrickson on 11/12/2018, which showed normal esophagus, no varices, small hiatal hernia, gastrojejunostomy found characterized by ulceration which was not bleeding, though likely source of recent hematochezia, gastric stenosis was found at the pylorus from known invasive adenocarcinoma.  Hemoglobin currently at 10.3 from 8.1 after 2 units packed red blood cell transfusion on 11/15/2018.  Patient has had a total of 27 units of packed red blood cell transfusion during this hospitalization.  Continue PPI and Carafate. Patient now with a gangrenous foot and to have amputation later on this week.   Candida esophagitis Status post full course of Diflucan.     Chronic bilateral pulmonary embolismstarted Lovenox Lovenox discontinued the morning of 11/10/2018, patient placed on heparin due to bright red blood per rectum.  Per oncology.  Anemia of chronic disease Status post multiple blood transfusions.  Patient has been transfused a total of 27 units packed red blood cells during his hospitalization. Patient was having bloody bowel movements last week felt initially secondary to  constipation.  Patient reassessed by GI.  Patient underwent repeat upper endoscopy.  No further bloody bowel movements.  Patient's hemoglobin was 8.1 on 11/15/2018 and patient transfused 2 units packed red blood cells in anticipation of amputation to be done later on this week.  Hemoglobin currently at 10.3.  Follow H&H.   Hypokalemia/hypomagnesemia -Potassium repleted and at 4.0.  Magnesium at 1.8.   Sepsis secondary to H CAP and Pseudomonas urinary tract infection -Status post full course of IV vancomycin IV cefepime. PICC line pulled out 1/26.  Ischemic right foot with right popliteal and tibial artery embolus/gangrenous right foot -Status post embolectomy. Complicated by compartment syndrome requiring fasciotomy.  Patient's right foot is now gangrenous and tender to palpation.  Vascular surgery was reconsulted back and patient seen in consultation by Dr. Scot Dock who assessed the patient and it was felt right foot is not salvageable and recommending BKA.  Patient currently not septic.  Patient has decided on amputation.  Spoke with Dr. Scot Dock, vascular surgery who states surgery likely could be done on Friday, 11/19/2018.  Patient will need to be transferred to [facility] on Thursday, 11/18/2018 in anticipation of right BKA.  Continue anticoagulation with heparin for now.    Cardiomyopathy, combined systolic and diastolic CHF, chronic -Patient currently euvolemic.  Follow.   Uncontrolled diabetes mellitus type 2 -Hemoglobin A1c 18.2. CBG of 218 this morning.  Continue Lantus and sliding scale insulin.  Follow.   Severe protein calorie malnutrition Patient diet has been advanced to a soft diet on 11/12/2018 which he seems to be tolerating.  TPN has been weaned off.   Generalized weakness-physical therapy/Occupational Therapy-recommending patient seen by PT/OT who are recommending  SNF.  Social work following.  Severe protein calorie malnutrition Patient was on TPN.  Patient  tolerating soft diet.  TPN has been weaned off.   DVT prophylaxis: Heparin Code Status: Full Family Communication: Updated patient.  No family at bedside. Disposition Plan: Patient to be transferred to [facility] on Thursday 11/18/2018 right foot amputation to be done by vascular surgery hopefully on 11/19/2018.  To be determined.  Patient homeless and likely not a suitable candidate for shelter at this time due to ongoing medical issues.   Consultants:   Gastroenterology  Oncology  Conventional radiology  Pulmonary and critical care medicine  Vascular surgery  Palliative care  General surgery  Cardiology  Procedures:  Laparoscopy, open gastrojejunostomy per Dr. Hassell Done 11/04/2018  Endoscopy 09/07/2018  EUS 09/16/2018  Chemo-port placement 10/12/2018  Repeat endoscopy 10/29/2018--per Dr. Loletha Carrow III  Repeat upper endoscopy 11/12/2018 per Dr. Hilarie Fredrickson  2 units packed red blood cells 11/15/2018.  Antimicrobials:   IV vancomycin--finished course  IV cefepime--- finished course   Subjective: Patient laying in bed.  Denies any chest pain or shortness of breath.  No further rectal bleeding.  Tolerating current diet.  Has decided to go ahead and get amputation.   Objective: Vitals:   11/15/18 1835 11/15/18 2013 11/15/18 2127 11/16/18 0545  BP: 109/81 122/85 (!) 124/96 111/78  Pulse: (!) 104 97 97 (!) 103  Resp: 18 20 20 20   Temp: 98.2 F (36.8 C) 98.5 F (36.9 C) 98.5 F (36.9 C) 98.4 F (36.9 C)  TempSrc: Oral Oral Oral Oral  SpO2: 100% 100% 100% 100%  Weight:      Height:        Intake/Output Summary (Last 24 hours) at 11/16/2018 1210 Last data filed at 11/16/2018 1100 Gross per 24 hour  Intake 1846.83 ml  Output 1900 ml  Net -53.17 ml   Filed Weights   11/02/18 1806 11/04/18 0831 11/12/18 0742  Weight: 56.2 kg 56.2 kg 56.2 kg    Examination:  General exam: NAD Respiratory system: CTA B no wheezes, no crackles, no rhonchi.  Normal respiratory  effort.  Speaking in full sentences. Cardiovascular system: RRR no murmurs rubs or gallops.  No JVD.  No lower extremity edema.  Gastrointestinal system: Abdomen is soft, nontender, nondistended, positive bowel sounds.  Midabdominal surgical wound clean dry and intact.  Steri-Strips in place.  Staples have been removed.  Central nervous system: Alert and oriented. No focal neurological deficits. Extremities: Gangrenous right foot. Symmetric 5 x 5 power. Skin: No rashes, lesions or ulcers Psychiatry: Judgement and insight appear normal. Mood & affect appropriate.     Data Reviewed: I have personally reviewed following labs and imaging studies  CBC: Recent Labs  Lab 11/13/18 0800 11/14/18 0511 11/15/18 0500 11/15/18 0700 11/16/18 0109  WBC 7.4 8.6 PATIENT IDENTIFICATION ERROR. PLEASE DISREGARD RESULTS. ACCOUNT WILL BE CREDITED. 8.1 8.8  HGB 8.0* 8.4* PATIENT IDENTIFICATION ERROR. PLEASE DISREGARD RESULTS. ACCOUNT WILL BE CREDITED. 8.1* 10.3*  HCT 26.1* 27.1* PATIENT IDENTIFICATION ERROR. PLEASE DISREGARD RESULTS. ACCOUNT WILL BE CREDITED. 25.8* 31.9*  MCV 93.9 93.8 PATIENT IDENTIFICATION ERROR. PLEASE DISREGARD RESULTS. ACCOUNT WILL BE CREDITED. 92.8 89.6  PLT 341 358 PATIENT IDENTIFICATION ERROR. PLEASE DISREGARD RESULTS. ACCOUNT WILL BE CREDITED. [phone]   Basic Metabolic Panel: Recent Labs  Lab 11/10/18 0559 11/11/18 0010 11/12/18 0334 11/13/18 0800 11/14/18 0511 11/15/18 0500 11/15/18 0700 11/16/18 0109  NA 132* 132* 136 134* 134* PATIENT IDENTIFICATION ERROR. PLEASE DISREGARD RESULTS. ACCOUNT WILL BE CREDITED. [place]  136  K 4.5 4.5 4.6 4.3 4.6 PATIENT IDENTIFICATION ERROR. PLEASE DISREGARD RESULTS. ACCOUNT WILL BE CREDITED. 4.2 4.0  CL 99 98 100 103 101 PATIENT IDENTIFICATION ERROR. PLEASE DISREGARD RESULTS. ACCOUNT WILL BE CREDITED. 101 101  CO2 26 29 29 28 24  PATIENT IDENTIFICATION ERROR. PLEASE DISREGARD RESULTS. ACCOUNT WILL BE CREDITED. 25 27  GLUCOSE 152* 243* 142* 75  132* PATIENT IDENTIFICATION ERROR. PLEASE DISREGARD RESULTS. ACCOUNT WILL BE CREDITED. 227* 154*  BUN 16 16 19 16 13  PATIENT IDENTIFICATION ERROR. PLEASE DISREGARD RESULTS. ACCOUNT WILL BE CREDITED. 12 11  CREATININE 0.47* 0.52* 0.40* 0.38* 0.42* PATIENT IDENTIFICATION ERROR. PLEASE DISREGARD RESULTS. ACCOUNT WILL BE CREDITED. 0.48* 0.48*  CALCIUM 8.0* 8.1* 8.3* 8.0* 8.2* PATIENT IDENTIFICATION ERROR. PLEASE DISREGARD RESULTS. ACCOUNT WILL BE CREDITED. 8.1* 8.4*  MG 2.0 2.0 2.1 1.8  --   --   --   --   PHOS 4.1 3.9 3.9 3.1  --   --   --   --    GFR: Estimated Creatinine Clearance: 81 mL/min (A) (by C-G formula based on SCr of 0.48 mg/dL (L)). Liver Function Tests: Recent Labs  Lab 11/10/18 0559  AST 50*  ALT 45*  ALKPHOS 73  BILITOT 0.2*  PROT 6.3*  ALBUMIN 1.8*   No results for input(s): LIPASE, AMYLASE in the last 168 hours. No results for input(s): AMMONIA in the last 168 hours. Coagulation Profile: No results for input(s): INR, PROTIME in the last 168 hours. Cardiac Enzymes: No results for input(s): CKTOTAL, CKMB, CKMBINDEX, TROPONINI in the last 168 hours. BNP (last 3 results) No results for input(s): PROBNP in the last 8760 hours. HbA1C: No results for input(s): HGBA1C in the last 72 hours. CBG: Recent Labs  Lab 11/15/18 0737 11/15/18 1203 11/15/18 1630 11/15/18 2125 11/16/18 0752  GLUCAP 190* 109* 152* 151* 218*   Lipid Profile: No results for input(s): CHOL, HDL, LDLCALC, TRIG, CHOLHDL, LDLDIRECT in the last 72 hours. Thyroid Function Tests: No results for input(s): TSH, T4TOTAL, FREET4, T3FREE, THYROIDAB in the last 72 hours. Anemia Panel: No results for input(s): VITAMINB12, FOLATE, FERRITIN, TIBC, IRON, RETICCTPCT in the last 72 hours. Sepsis Labs: No results for input(s): PROCALCITON, LATICACIDVEN in the last 168 hours.  No results found for this or any previous visit (from the past 240 hour(s)).       Radiology Studies: No results  found.      Scheduled Meds: . acetaminophen  1,000 mg Oral Q8H  . feeding supplement (ENSURE ENLIVE)  237 mL Oral QID  . heparin lock flush  500 Units Intracatheter Q30 days  . insulin aspart  0-20 Units Subcutaneous TID WC  . insulin aspart  0-5 Units Subcutaneous QHS  . pantoprazole  40 mg Oral BID  . sucralfate  1 g Oral TID WC & HS   Continuous Infusions: . sodium chloride 10 mL/hr at 11/16/18 0131  . heparin       LOS: 74 days    Time spent: 35 minutes    Irine Seal, MD [facility]  If 7PM-7AM, please contact night-coverage [email or web address] 11/16/2018, 12:10 PM

## 2018-11-16 NOTE — Progress Notes (Signed)
Unfortunately, the right foot is not viable.  I am very disappointed in this.  He has had surgery.  He has been on blood thinner.  He has done everything possible to try to maintain his viability of the extremities.  Unfortunately, I just have a feeling that he is very hypercoagulable from his malignancy.  Unfortunately, with his right foot the way it is, I cannot give him chemotherapy for his pancreatic cancer safely.  If I gave him the chemotherapy protocol that I would like to give, he would be at high risk for bacteremia and sepsis and death.  I talked him about this this morning.  I explained why we cannot treat him with the right foot as it is.  He understands this.  It sounds like he might go over to [facility] to have the amputation.  He is on heparin.  He does have some bleeding which is inevitable.  He was transfused yesterday.  His hemoglobin went from 8.1 up to 10.3.  His white cell count is fine at 8.8 and platelet count is okay at 363.  He is on the heparin drip which he needs to be on.  His blood sugars are a little bit better.  He is eating soft food.  This is definitely improving his quality of life.  He is very happy about being able to eat solid food.  On his physical exam, his vital signs show temperature 98.4.  Pulse 103.  Blood pressure 111/78.  His oral exam does not show any thrush.  His lungs sound clear bilaterally.  Cardiac exam slightly tachycardic but regular.  Abdomen is soft.  He has a healing laparotomy scar.  There is no fluid wave.  There is no guarding or rebound tenderness.  There is no abdominal mass.  He has no palpable hepatomegaly.  Extremities shows the symmetric muscle atrophy in upper lower extremities.  He has the dressing on the right foot.  Neurological exam is nonfocal.  We now have to get Ryan Medina to surgery for a right foot amputation.  Hopefully this will be done soon.  He did have the upper endoscopy last week.  It does show the ulceration  at the site of the anastomosis.  We really are "stuck in limbo" and that we really need to treat him with chemotherapy to help because this ulcer to regress but we cannot because of the foot which will lead to sepsis in the face of chemotherapy.  We will continue to follow along.   [name], MD  [place] 12:12

## 2018-11-17 LAB — CBC
HCT: 35.8 % — ABNORMAL LOW (ref 39.0–52.0)
Hemoglobin: 10.9 g/dL — ABNORMAL LOW (ref 13.0–17.0)
MCH: 28.6 pg (ref 26.0–34.0)
MCHC: 30.4 g/dL (ref 30.0–36.0)
MCV: 94 fL (ref 80.0–100.0)
PLATELETS: 371 10*3/uL (ref 150–400)
RBC: 3.81 MIL/uL — ABNORMAL LOW (ref 4.22–5.81)
RDW: 16.6 % — ABNORMAL HIGH (ref 11.5–15.5)
WBC: 7.1 10*3/uL (ref 4.0–10.5)
nRBC: 0 % (ref 0.0–0.2)

## 2018-11-17 LAB — GLUCOSE, CAPILLARY
Glucose-Capillary: 233 mg/dL — ABNORMAL HIGH (ref 70–99)
Glucose-Capillary: 89 mg/dL (ref 70–99)
Glucose-Capillary: 186 mg/dL — ABNORMAL HIGH (ref 70–99)
Glucose-Capillary: 151 mg/dL — ABNORMAL HIGH (ref 70–99)

## 2018-11-17 LAB — BASIC METABOLIC PANEL
ANION GAP: 7 (ref 5–15)
BUN: 16 mg/dL (ref 6–20)
CALCIUM: 8.4 mg/dL — AB (ref 8.9–10.3)
CO2: 26 mmol/L (ref 22–32)
Chloride: 103 mmol/L (ref 98–111)
Creatinine, Ser: 0.57 mg/dL — ABNORMAL LOW (ref 0.61–1.24)
GFR calc Af Amer: >60 mL/min (ref >60)
GFR calc non Af Amer: >60 mL/min (ref >60)
Glucose, Bld: 254 mg/dL — ABNORMAL HIGH (ref 70–99)
Potassium: 3.8 mmol/L (ref 3.5–5.1)
Sodium: 136 mmol/L (ref 135–145)

## 2018-11-17 LAB — HEPARIN LEVEL (UNFRACTIONATED): HEPARIN UNFRACTIONATED: 0.66 [IU]/mL (ref 0.30–0.70)

## 2018-11-17 MED ORDER — HEPARIN (PORCINE) 25000 UT/250ML-% IV SOLN
1350.0000 [IU]/h | INTRAVENOUS | Status: DC
  Administered 2018-11-17: 1350 [IU]/h via INTRAVENOUS
  Filled 2018-11-17 (×2): qty 250

## 2018-11-17 NOTE — Progress Notes (Signed)
[facility]                                                                              Patient Demographics  Ryan Medina, is a 58 y.o. male, DOB - 14-Jun-1961, SWF:[id number]  Admit date - 09/03/2018   Admitting Physician [name], MD  Outpatient Primary MD for the patient is Patient, No Pcp Per  Outpatient specialists:   LOS - 75  days   Medical records reviewed and are as summarized below:    Chief Complaint  Patient presents with  . Numbness       Brief summary   58 year old with history of peripheral vascular disease, dilated cardiomyopathy, essential hypertension, diabetes mellitus type 2 initially came to the hospital with right lower extremity pain and was found to have ischemic leg requiring embolectomy on 12/13. He was placed on heparin drip subsequently developed right lower extremity hematoma requiring fasciotomy on 12/15. Unfortunately afterwards he decompensated with active GI bleed and was intubated and was moved to the ICU. He required embolization on 12/17. During the course of his hospitalization he is required total of 25 units of blood. Further evaluation showed pancreatic cancer with duodenal ulcer. Transferred to [facility] for radiation treatment to help with bleeding. Hospital course complicated by sepsis from healthcare acquired pneumonia and Pseudomonas UTI requiring treatment with vancomycin and cefepime.Patient found to have chronic PE therefore started on heparin drip per oncology. Repeat endoscopy showed worsening of gastric outlet obstruction. Initially NG tube placed by patient removed it.  Currently tolerating soft diet.  Assessment & Plan    Principal Problem: Hematochezia: Secondary to ulceration at the gastrojejunostomy area -Currently no acute issues, patient had been complaining of BRPR since surgery was done on 11/04/2018. -Patient was being followed by oncology who requested GI evaluation prior to  initiation of chemotherapy for the hematochezia and anemia -Patient was changed to IV heparin per oncology.  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   -Continue PPI, Carafate, currently tolerating soft diet  Pancreatic adenocarcinoma Hemorrhagic shock due to GI bleed from duodenal ulcer-> currently stable Gastric outlet obstruction causing dysphagia, abdominal distention -CT showed increase in size of the mass with involvement of intra-abdominal arteries deemed to be inoperable, status post radiation.  Per oncology plans for outpatient radiation and chemo   Adenocarcinoma of pancreas Hemorrhagic shock due to GI bleed from duodenal ulcer, stable Gastric outlet obstruction causing dysphagia and abdominal distention -CT scan showed decreased enhancement in the mid pancreatic body suggesting residual/recurrent neoplasm, involvement of intra-abdominal arteries, deemed to be inoperable.  -Status post XRT, per oncology plans for outpatient radiation and chemo -Status post EGD 12/17, GDA embolization 12/17, status post XRT. EGD showed worsening  of gastric outlet obstruction/pyloric stenosis contrary to malignancy, candida esophagitis -Continue PPI -Currently tolerating soft diet -Patient has a port, TPN has been weaned off -EGD by Dr. Arnette Felts on 2/21 showed normal esophagus, no varices, small lateral hernia, gastrojejunostomy with ulceration, no bleeding though  likely source of recent hematochezia, gastric stenosis at the pylorus from known invasive adenocarcinoma -H&H currently stable, patient received total of 27 units of packed RBCs during this hospitalization -Continue PPI, Carafate  Right foot ischemic with right popliteal and tibial artery embolus/gangrene -Status post embolectomy, complicated by compartment syndrome requiring fasciotomy -Vascular surgery was reconsulted, seen in consultation by Dr. Doren Custard, recommended BKA as the right foot is not salvageable -Plan for surgery on 2/28, patient will be transferred to [facility] on Thursday 2/27 -Continue anticoagulation with IV heparin   Candida esophagitis -Patient has completed full course of Diflucan  Chronic bilateral pulmonary embolism -Patient was placed on Lovenox however discontinued on 11/10/2018, placed on heparin due to rectal bleeding -Per oncology  Acute blood loss anemia with history of anemia of chronic disease -Status post multiple packed RBC transfusions, total of 27 units -Currently H&H stable  Sepsis secondary to HCAP, Pseudomonas UTI Status post full course of IV vancomycin and cefepime, PICC line pulled out 1/26  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 -Currently euvolemic, no acute issues  Uncontrolled diabetes mellitus type 2 with hyperglycemia, vascular issues -Continue Lantus, sliding scale insulin, hemoglobin A1c 18.2  Severe protein calorie malnutrition TPN has been weaned off, started on soft diet and tolerating  Generalized debility -PT recommended skilled nursing facility after acute issues are resolved  Code Status: Partial DVT Prophylaxis: Heparin drip Family Communication: Discussed in detail with the patient, all imaging results, lab results explained to the patient    Disposition Plan: Plan to transfer to [facility] on 2/27 for surgery on 2/28  Time Spent in minutes   35 minutes  Procedures:           Laparoscopy, open gastrojejunostomy per Dr. Hassell Done 11/04/2018  Endoscopy 09/07/2018  EUS 09/16/2018  Chemo-port placement 10/12/2018  Repeat endoscopy 10/29/2018--per Dr. Loletha Carrow III  Repeat upper endoscopy 11/12/2018 per Dr. Hilarie Fredrickson  2 units packed red blood cells 11/15/2018.   Consultants:    Gastroenterology  Oncology  Conventional radiology  Pulmonary and critical care medicine  Vascular surgery  Palliative care  General surgery  Cardiology  Antimicrobials:   Anti-infectives (From admission, onward)   Start     Dose/Rate Route Frequency Ordered Stop   11/05/18 0930  cefoTEtan (CEFOTAN) 2 g in sodium chloride 0.9 % 100 mL IVPB  Status:  Discontinued     2 g 200 mL/hr over 30 Minutes Intravenous  Once 11/04/18 1237 11/04/18 1323   11/04/18 2130  cefoTEtan (CEFOTAN) 2 g in sodium chloride 0.9 % 100 mL IVPB     2 g 200 mL/hr over 30 Minutes Intravenous  Once 11/04/18 1323 11/04/18 2128   11/04/18 0815  cefoTEtan (CEFOTAN) 2 g in sodium chloride 0.9 % 100 mL IVPB     2 g 200 mL/hr over 30 Minutes Intravenous On call to O.R. 11/03/18 1135 11/04/18 0931   11/01/18 1000  fluconazole (DIFLUCAN) tablet 100 mg  Status:  Discontinued     100 mg Oral Daily 11/01/18 0828 11/04/18 1237   10/29/18 1600  fluconazole (DIFLUCAN) IVPB 100 mg  Status:  Discontinued     100 mg 50 mL/hr over 60 Minutes Intravenous Every 24 hours 10/29/18 1529 11/01/18 6712  10/11/18 2300  vancomycin (VANCOCIN) IVPB 750 mg/150 ml premix  Status:  Discontinued     750 mg 150 mL/hr over 60 Minutes Intravenous Every 12 hours 10/11/18 1037 10/13/18 1954   10/11/18 1500  ceFAZolin (ANCEF) IVPB 2g/100 mL premix     2 g 200 mL/hr over 30 Minutes Intravenous To Radiology 10/08/18 1348 10/12/18 1500   10/11/18 1200  ceFEPIme (MAXIPIME) 1 g in sodium chloride 0.9 % 100 mL IVPB  Status:  Discontinued     1 g 200 mL/hr over 30 Minutes Intravenous Every 8 hours 10/11/18 1037 10/13/18 1954   10/11/18 1045   vancomycin (VANCOCIN) 1,250 mg in sodium chloride 0.9 % 250 mL IVPB     1,250 mg 166.7 mL/hr over 90 Minutes Intravenous  Once 10/11/18 1037 10/11/18 2057   10/07/18 1600  ceFEPIme (MAXIPIME) 1 g in sodium chloride 0.9 % 100 mL IVPB     1 g 200 mL/hr over 30 Minutes Intravenous Every 8 hours 10/07/18 1452 10/08/18 0105   10/06/18 1000  fluconazole (DIFLUCAN) tablet 100 mg  Status:  Discontinued     100 mg Oral Daily 10/06/18 0709 10/11/18 0834   10/02/18 2100  vancomycin (VANCOCIN) IVPB 750 mg/150 ml premix  Status:  Discontinued     750 mg 150 mL/hr over 60 Minutes Intravenous Every 12 hours 10/02/18 0812 10/04/18 1420   10/02/18 0900  vancomycin (VANCOCIN) 1,250 mg in sodium chloride 0.9 % 250 mL IVPB     1,250 mg 166.7 mL/hr over 90 Minutes Intravenous  Once 10/02/18 0812 10/02/18 1132   10/02/18 0900  ceFEPIme (MAXIPIME) 1 g in sodium chloride 0.9 % 100 mL IVPB  Status:  Discontinued     1 g 200 mL/hr over 30 Minutes Intravenous Every 8 hours 10/02/18 0812 10/07/18 0925   10/02/18 0815  ceFEPIme (MAXIPIME) 1 g in sodium chloride 0.9 % 100 mL IVPB  Status:  Discontinued     1 g 200 mL/hr over 30 Minutes Intravenous Every 8 hours 10/02/18 0800 10/02/18 0812   10/02/18 0800  ceFEPIme (MAXIPIME) 2 g in sodium chloride 0.9 % 100 mL IVPB  Status:  Discontinued     2 g 200 mL/hr over 30 Minutes Intravenous  Once 10/02/18 0757 10/02/18 0800   10/02/18 0800  metroNIDAZOLE (FLAGYL) IVPB 500 mg  Status:  Discontinued     500 mg 100 mL/hr over 60 Minutes Intravenous Every 8 hours 10/02/18 0757 10/02/18 0800   10/02/18 0800  vancomycin (VANCOCIN) IVPB 1000 mg/200 mL premix  Status:  Discontinued     1,000 mg 200 mL/hr over 60 Minutes Intravenous  Once 10/02/18 0757 10/02/18 0800   09/27/18 0000  ceFAZolin (ANCEF) IVPB 2g/100 mL premix     2 g 200 mL/hr over 30 Minutes Intravenous To Radiology 09/25/18 0902 09/28/18 0000   09/10/18 2130  ceFAZolin (ANCEF) IVPB 2g/100 mL premix     2 g 200  mL/hr over 30 Minutes Intravenous Every 8 hours 09/10/18 1634 09/11/18 0700   09/10/18 0800  ceFAZolin (ANCEF) IVPB 1 g/50 mL premix    Note to Pharmacy:  Send with pt to OR   1 g 100 mL/hr over 30 Minutes Intravenous On call 09/09/18 0756 09/10/18 1407   09/07/18 1300  erythromycin 250 mg in sodium chloride 0.9 % 100 mL IVPB     250 mg 100 mL/hr over 60 Minutes Intravenous Once 09/07/18 1125 09/07/18 2351   09/06/18 0000  ceFAZolin (ANCEF) IVPB 1 g/50 mL  premix    Note to Pharmacy:  Send with pt to OR   1 g 100 mL/hr over 30 Minutes Intravenous On call 09/05/18 0849 09/05/18 0952   09/03/18 2245  ceFAZolin (ANCEF) IVPB 2g/100 mL premix     2 g 200 mL/hr over 30 Minutes Intravenous Every 8 hours 09/03/18 2236 09/04/18 1444   09/03/18 1615  ceFAZolin (ANCEF) IVPB 2g/100 mL premix  Status:  Discontinued     2 g 200 mL/hr over 30 Minutes Intravenous On call to O.R. 09/03/18 1600 09/03/18 2035          Medications  Scheduled Meds: . acetaminophen  1,000 mg Oral Q8H  . feeding supplement (ENSURE ENLIVE)  237 mL Oral TID BM  . heparin lock flush  500 Units Intracatheter Q30 days  . insulin aspart  0-20 Units Subcutaneous TID WC  . insulin aspart  0-5 Units Subcutaneous QHS  . pantoprazole  40 mg Oral BID  . sucralfate  1 g Oral TID WC & HS   Continuous Infusions: . sodium chloride 10 mL/hr at 11/17/18 0323  . heparin 1,350 Units/hr (11/17/18 0800)   PRN Meds:.sodium chloride, heparin lock flush **AND** heparin lock flush, HYDROmorphone (DILAUDID) injection, ipratropium-albuterol, lip balm, LORazepam, menthol-cetylpyridinium, metoprolol tartrate, ondansetron (ZOFRAN) IV, oxyCODONE, phenol, sodium chloride flush      Subjective:   Ryan Medina was seen and examined today.  Patient denies dizziness, chest pain, shortness of breath, abdominal pain, N/V/D/C, new weakness, numbess, tingling. No acute events overnight.  Agreeing for the surgery.  Objective:   Vitals:    11/16/18 1449 11/16/18 1545 11/17/18 0616 11/17/18 1328  BP: (!) 119/105 120/68 (!) 125/96 (!) 135/110  Pulse: 64  95 (!) 103  Resp: 18  19 20   Temp: 97.7 F (36.5 C)  98 F (36.7 C) 98.4 F (36.9 C)  TempSrc: Oral  Oral Oral  SpO2: 99%  99% 100%  Weight:      Height:        Intake/Output Summary (Last 24 hours) at 11/17/2018 1345 Last data filed at 11/17/2018 1300 Gross per 24 hour  Intake 3727.95 ml  Output 650 ml  Net 3077.95 ml     Wt Readings from Last 3 Encounters:  11/12/18 56.2 kg  08/24/18 66.7 kg  08/15/18 66.7 kg     Exam  General: Alert and oriented x 3, NAD  Eyes:   HEENT:    Cardiovascular: S1 S2 auscultated, Regular rate and rhythm.  Respiratory: Clear to auscultation bilaterally, no wheezing, rales or rhonchi  Gastrointestinal: Soft, nontender, nondistended, + bowel sounds  Ext: no pedal edema bilaterally  Neuro: No new deficits  Musculoskeletal: No digital cyanosis, clubbing  Skin: Gangrenous right foot with dressing  Psych: Normal affect and demeanor, alert and oriented x3    Data Reviewed:  I have personally reviewed following labs and imaging studies  Micro Results No results found for this or any previous visit (from the past 240 hour(s)).  Radiology Reports Dg Abd 1 View  Result Date: 11/07/2018 CLINICAL DATA:  Abdominal pain. EXAM: ABDOMEN - 1 VIEW COMPARISON:  October 29, 2018 FINDINGS: Moderate fecal loading in the colon. No evidence of bowel obstruction. There is a paucity of small bowel gas but no evidence of obstruction. No other acute abnormalities. IMPRESSION: Moderate fecal loading in the colon. No other acute abnormalities noted. Electronically Signed   By: Dorise Bullion III M.D   On: 11/07/2018 18:37   Ct Chest W Contrast  Addendum  Date: 10/27/2018   ADDENDUM REPORT: 10/27/2018 13:41 ADDENDUM: The original report was by Dr. Van Clines. The following addendum is by Dr. Van Clines: Critical Value/emergent  results were called by telephone at the time of interpretation on 10/27/2018 at 1:40 pm to Dr. Gerlean Ren , who verbally acknowledged these results. Electronically Signed   By: Van Clines M.D.   On: 10/27/2018 13:41   Result Date: 10/27/2018 CLINICAL DATA:  Pancreatic adenocarcinoma with duodenal ulcer, chemotherapy and radiation therapy in progress. EXAM: CT CHEST, ABDOMEN, AND PELVIS WITH CONTRAST TECHNIQUE: Multidetector CT imaging of the chest, abdomen and pelvis was performed following the standard protocol during bolus administration of intravenous contrast. CONTRAST:  183mL OMNIPAQUE IOHEXOL 300 MG/ML  SOLN COMPARISON:  Multiple exams, including CT abdomen from 09/27/2018 FINDINGS: CT CHEST FINDINGS Cardiovascular: Today's exam was not performed for pulmonary arterial assessment, but there is mostly peripheral and segmental right lower lobe filling defect in the pulmonary artery compatible with pulmonary embolus, likely chronic. Similar peripheral segmental and subsegmental filling defects in the left upper lobe and left lower lobe pulmonary arteries. Right Port-A-Cath tip: Cavoatrial junction. Atherosclerotic calcification of the aortic arch and branch vessels. Mediastinum/Nodes: Hazy density in the mediastinal adipose tissue as well as the subcutaneous adipose tissue possibly from third spacing of fluid. Right lower paratracheal node 1.1 cm in short axis on image 42/7. Lungs/Pleura: Bandlike scarring or atelectasis in the left lower lobe at the site of prior airspace opacity, mildly improved although there is some continued anterior airspace opacity. Musculoskeletal: Small oblong sclerotic lesion in the left eighth rib lateral, not changed from 08/22/2018 common no cortical irregularity or cortical destruction. CT ABDOMEN PELVIS FINDINGS Hepatobiliary: Lateral segment left hepatic lobe cyst on image 88/7. Potential 4 mm hypodense lesion in the right hepatic lobe on image 102/7, technically  nonspecific. Pancreas: Highly ill-defined pancreas. Indistinct stranding around the celiac trunk and superior mesenteric artery. Hypodensity favoring pancreatic body mass measuring about 2.4 by 4.1 cm on image 108/7. Dilated dorsal pancreatic duct. Spleen: Unremarkable. Adrenals/Urinary Tract: Small hypodense renal lesions are technically too small to characterize although statistically likely to be benign. Adrenal glands unremarkable. Stomach/Bowel: Prominently dilated stomach filled with food material. Poor definition of the duodenum. I can not exclude gastric outlet obstruction. Presumed vascular coils in the gastroduodenal artery. Poor definition of bowel wall due to the diffuse edema. Vascular/Lymphatic: Mild gastric varices. Infrarenal abdominal aortic aneurysm 3.1 cm in diameter. Aneurysmal bilateral common iliac arteries. Ectatic external iliac arteries. The portal vein and SMV remain patent. Varices are noted along the omentum and stomach. Reproductive: Unremarkable Other: Diffuse subcutaneous and mesenteric edema. Musculoskeletal: Spondylosis and degenerative disc disease at L5-S1. IMPRESSION: 1. Chronic appearing bilateral pulmonary embolus most notably involving the right lower lobe. 2. Mild enlargement of the pancreatic mass. No findings of metastatic disease to the liver currently. Small cyst in the lateral segment left hepatic lobe. 3. Notable dilatation of the stomach which is filled with food. This raises the possibility of gastric outlet obstruction or narrowing along the descending duodenum. No bowel dilatation. 4. Third spacing of fluid with diffuse subcutaneous, mesenteric, and mediastinal edema. 5. Gastric and omental varices likely from portal venous hypertension. 6. Mild aneurysmal dilatation of the abdominal aorta at 3.1 cm with aneurysmal iliac arteries. Recommend followup by [place] in 3 years. This recommendation follows ACR consensus guidelines: White Paper of the ACR Incidental Findings  Committee II on Vascular Findings. J Am Coll Radiol 2013; 10:789-794. 7.  Aortic Atherosclerosis (ICD10-I70.0). Radiology  assistant personnel have been notified to put me in telephone contact with the referring physician or the referring physician's clinical representative in order to discuss these findings. Once this communication is established I will issue an addendum to this report for documentation purposes. Electronically Signed: By: Van Clines M.D. On: 10/27/2018 13:00   Ct Abdomen Pelvis W Contrast  Addendum Date: 10/27/2018   ADDENDUM REPORT: 10/27/2018 13:41 ADDENDUM: The original report was by Dr. Van Clines. The following addendum is by Dr. Van Clines: Critical Value/emergent results were called by telephone at the time of interpretation on 10/27/2018 at 1:40 pm to Dr. Gerlean Ren , who verbally acknowledged these results. Electronically Signed   By: Van Clines M.D.   On: 10/27/2018 13:41   Result Date: 10/27/2018 CLINICAL DATA:  Pancreatic adenocarcinoma with duodenal ulcer, chemotherapy and radiation therapy in progress. EXAM: CT CHEST, ABDOMEN, AND PELVIS WITH CONTRAST TECHNIQUE: Multidetector CT imaging of the chest, abdomen and pelvis was performed following the standard protocol during bolus administration of intravenous contrast. CONTRAST:  164mL OMNIPAQUE IOHEXOL 300 MG/ML  SOLN COMPARISON:  Multiple exams, including CT abdomen from 09/27/2018 FINDINGS: CT CHEST FINDINGS Cardiovascular: Today's exam was not performed for pulmonary arterial assessment, but there is mostly peripheral and segmental right lower lobe filling defect in the pulmonary artery compatible with pulmonary embolus, likely chronic. Similar peripheral segmental and subsegmental filling defects in the left upper lobe and left lower lobe pulmonary arteries. Right Port-A-Cath tip: Cavoatrial junction. Atherosclerotic calcification of the aortic arch and branch vessels. Mediastinum/Nodes: Hazy density  in the mediastinal adipose tissue as well as the subcutaneous adipose tissue possibly from third spacing of fluid. Right lower paratracheal node 1.1 cm in short axis on image 42/7. Lungs/Pleura: Bandlike scarring or atelectasis in the left lower lobe at the site of prior airspace opacity, mildly improved although there is some continued anterior airspace opacity. Musculoskeletal: Small oblong sclerotic lesion in the left eighth rib lateral, not changed from 08/22/2018 common no cortical irregularity or cortical destruction. CT ABDOMEN PELVIS FINDINGS Hepatobiliary: Lateral segment left hepatic lobe cyst on image 88/7. Potential 4 mm hypodense lesion in the right hepatic lobe on image 102/7, technically nonspecific. Pancreas: Highly ill-defined pancreas. Indistinct stranding around the celiac trunk and superior mesenteric artery. Hypodensity favoring pancreatic body mass measuring about 2.4 by 4.1 cm on image 108/7. Dilated dorsal pancreatic duct. Spleen: Unremarkable. Adrenals/Urinary Tract: Small hypodense renal lesions are technically too small to characterize although statistically likely to be benign. Adrenal glands unremarkable. Stomach/Bowel: Prominently dilated stomach filled with food material. Poor definition of the duodenum. I can not exclude gastric outlet obstruction. Presumed vascular coils in the gastroduodenal artery. Poor definition of bowel wall due to the diffuse edema. Vascular/Lymphatic: Mild gastric varices. Infrarenal abdominal aortic aneurysm 3.1 cm in diameter. Aneurysmal bilateral common iliac arteries. Ectatic external iliac arteries. The portal vein and SMV remain patent. Varices are noted along the omentum and stomach. Reproductive: Unremarkable Other: Diffuse subcutaneous and mesenteric edema. Musculoskeletal: Spondylosis and degenerative disc disease at L5-S1. IMPRESSION: 1. Chronic appearing bilateral pulmonary embolus most notably involving the right lower lobe. 2. Mild enlargement of  the pancreatic mass. No findings of metastatic disease to the liver currently. Small cyst in the lateral segment left hepatic lobe. 3. Notable dilatation of the stomach which is filled with food. This raises the possibility of gastric outlet obstruction or narrowing along the descending duodenum. No bowel dilatation. 4. Third spacing of fluid with diffuse subcutaneous, mesenteric, and mediastinal edema. 5.  Gastric and omental varices likely from portal venous hypertension. 6. Mild aneurysmal dilatation of the abdominal aorta at 3.1 cm with aneurysmal iliac arteries. Recommend followup by [place] in 3 years. This recommendation follows ACR consensus guidelines: White Paper of the ACR Incidental Findings Committee II on Vascular Findings. J Am Coll Radiol 2013; 10:789-794. 7.  Aortic Atherosclerosis (ICD10-I70.0). Radiology assistant personnel have been notified to put me in telephone contact with the referring physician or the referring physician's clinical representative in order to discuss these findings. Once this communication is established I will issue an addendum to this report for documentation purposes. Electronically Signed: By: Van Clines M.D. On: 10/27/2018 13:00   Dg Abd Portable 1v  Result Date: 10/29/2018 CLINICAL DATA:  Follow-up gastric catheter placement EXAM: PORTABLE ABDOMEN - 1 VIEW COMPARISON:  None. FINDINGS: Gastric catheter is noted within the stomach. Changes of prior embolus therapy are noted in the right upper quadrant. Contrast material is noted scattered throughout the colon related to the recent CT examination. Mild retained fecal material is noted. IMPRESSION: Gastric catheter within the stomach. Electronically Signed   By: Inez Catalina M.D.   On: 10/29/2018 16:34   [place] Ekg Site Rite  Result Date: 11/05/2018 If Site Rite image not attached, placement could not be confirmed due to current cardiac rhythm.  Ct Angio Abd/pel W/ And/or W/o  Result Date: 11/10/2018 CLINICAL  DATA:  Pancreatic carcinoma with partial duodenal obstruction, gastric varices. GI bleed since gastrojejunostomy on 11/04/18^ EXAM: CTA ABDOMEN AND PELVIS wITHOUT AND WITH CONTRAST TECHNIQUE: Multidetector CT imaging of the abdomen and pelvis was performed using the standard protocol during bolus administration of intravenous contrast. Multiplanar reconstructed images and MIPs were obtained and reviewed to evaluate the vascular anatomy. CONTRAST:  128mL ISOVUE-370 IOPAMIDOL (ISOVUE-370) INJECTION 76% COMPARISON:  10/27/2018 FINDINGS: VASCULAR Aorta: Fusiform abdominal aortic aneurysm measured up to 3.1 cm maximum diameter, stable. No significant intraluminal thrombus. No dissection or stenosis. Celiac: Patent. The left gastric artery arises directly from the abdominal aorta, an anatomic variant. Metallic gastroduodenal embolization coils without evidence of recanalization. SMA: Patent without evidence of aneurysm, dissection, vasculitis or significant stenosis. Renals: Both renal arteries are patent without evidence of aneurysm, dissection, vasculitis, fibromuscular dysplasia or significant stenosis. IMA: Short-segment origin stenosis. Distally patent without evidence of aneurysm, dissection, vasculitis or significant stenosis. Inflow: Fusiform dilatation of the right common iliac artery up to 2.9 cm diameter. Fusiform dilatation of left common iliac artery up to 2.7 cm diameter with some eccentric nonocclusive mural thrombus. Tortuous ectatic distal iliac arterial systems without significant stenosis. Proximal Outflow: Bilateral common femoral and visualized portions of the superficial and profunda femoral arteries are patent without evidence of aneurysm, dissection, vasculitis or significant stenosis. Veins: Apparent splenic venous occlusion with collaterals from the splenic hilum extending along the anterior mesentery. Streak artifact from GDA embolization coils obscures visualization of the portosplenic  confluence. Main portal vein and intrahepatic branches are patent. Hepatic veins patent. Review of the MIP images confirms the above findings. NON-VASCULAR Lower chest: Chronic atelectasis/scarring in the left lower lobe. No pleural or pericardial effusion. Hepatobiliary: No focal liver abnormality is seen. No gallstones, gallbladder wall thickening, or biliary dilatation. Pancreas: Decreased enhancement in the mid pancreatic body. Atrophy in the pancreatic tail. Some image degradation through the pancreatic head secondary to regional embolization coils. Spleen: Normal in size without focal lesion. Adrenals/Urinary Tract: Adrenal glands are unremarkable. Kidneys are normal, without renal calculi, focal lesion, or hydronephrosis. Bladder is unremarkable. Stomach/Bowel: Stomach is nondilated.  Gastrojejunostomy staple line in the lateral gastric body. No evidence of active extravasation. Small bowel is decompressed. Moderate proximal colonic and rectal fecal material without dilatation. Lymphatic: No definite abdominal or pelvic adenopathy, evaluation limited secondary to paucity of fat and diffuse anasarca. Reproductive: Prostate is unremarkable. Other: No significant ascites. Scattered bubbles of free intraperitoneal air presumably postoperative. Musculoskeletal: Midline anterior abdominal wall skin staples. Lumbosacral spondylitic change. No fracture or worrisome bone lesion. IMPRESSION: VASCULAR 1. No evidence of active extravasation. 2. 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 3. 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 NON-VASCULAR 1. No acute findings. 2. Decreased enhancement in the mid pancreatic body suggesting residual/recurrent neoplasm. Electronically Signed   By: Lucrezia Europe M.D.   On: 11/10/2018 16:46    Lab Data:  CBC: Recent Labs  Lab 11/14/18 0511 11/15/18 0500 11/15/18 0700  11/16/18 0109 11/17/18 0408  WBC 8.6 PATIENT IDENTIFICATION ERROR. PLEASE DISREGARD RESULTS. ACCOUNT WILL BE CREDITED. 8.1 8.8 7.1  HGB 8.4* PATIENT IDENTIFICATION ERROR. PLEASE DISREGARD RESULTS. ACCOUNT WILL BE CREDITED. 8.1* 10.3* 10.9*  HCT 27.1* PATIENT IDENTIFICATION ERROR. PLEASE DISREGARD RESULTS. ACCOUNT WILL BE CREDITED. 25.8* 31.9* 35.8*  MCV 93.8 PATIENT IDENTIFICATION ERROR. PLEASE DISREGARD RESULTS. ACCOUNT WILL BE CREDITED. 92.8 89.6 94.0  PLT 358 PATIENT IDENTIFICATION ERROR. PLEASE DISREGARD RESULTS. ACCOUNT WILL BE CREDITED. 343 363 010   Basic Metabolic Panel: Recent Labs  Lab 11/11/18 0010 11/12/18 0334 11/13/18 0800 11/14/18 0511 11/15/18 0500 11/15/18 0700 11/16/18 0109 11/17/18 0408  NA 132* 136 134* 134* PATIENT IDENTIFICATION ERROR. PLEASE DISREGARD RESULTS. ACCOUNT WILL BE CREDITED. 135 136 136  K 4.5 4.6 4.3 4.6 PATIENT IDENTIFICATION ERROR. PLEASE DISREGARD RESULTS. ACCOUNT WILL BE CREDITED. 4.2 4.0 3.8  CL 98 100 103 101 PATIENT IDENTIFICATION ERROR. PLEASE DISREGARD RESULTS. ACCOUNT WILL BE CREDITED. 101 101 103  CO2 29 29 28 24  PATIENT IDENTIFICATION ERROR. PLEASE DISREGARD RESULTS. ACCOUNT WILL BE CREDITED. 25 27 26   GLUCOSE 243* 142* 75 132* PATIENT IDENTIFICATION ERROR. PLEASE DISREGARD RESULTS. ACCOUNT WILL BE CREDITED. 227* 154* 254*  BUN 16 19 16 13  PATIENT IDENTIFICATION ERROR. PLEASE DISREGARD RESULTS. ACCOUNT WILL BE CREDITED. 12 11 16   CREATININE 0.52* 0.40* 0.38* 0.42* PATIENT IDENTIFICATION ERROR. PLEASE DISREGARD RESULTS. ACCOUNT WILL BE CREDITED. 0.48* 0.48* 0.57*  CALCIUM 8.1* 8.3* 8.0* 8.2* PATIENT IDENTIFICATION ERROR. PLEASE DISREGARD RESULTS. ACCOUNT WILL BE CREDITED. 8.1* 8.4* 8.4*  MG 2.0 2.1 1.8  --   --   --   --   --   PHOS 3.9 3.9 3.1  --   --   --   --   --    GFR: Estimated Creatinine Clearance: 81 mL/min (A) (by C-G formula based on SCr of 0.57 mg/dL (L)). Liver Function Tests: No results for input(s): AST, ALT, ALKPHOS,  BILITOT, PROT, ALBUMIN in the last 168 hours. No results for input(s): LIPASE, AMYLASE in the last 168 hours. No results for input(s): AMMONIA in the last 168 hours. Coagulation Profile: No results for input(s): INR, PROTIME in the last 168 hours. Cardiac Enzymes: No results for input(s): CKTOTAL, CKMB, CKMBINDEX, TROPONINI in the last 168 hours. BNP (last 3 results) No results for input(s): PROBNP in the last 8760 hours. HbA1C: No results for input(s): HGBA1C in the last 72 hours. CBG: Recent Labs  Lab 11/16/18 1213 11/16/18 1653 11/16/18 2141 11/17/18 0713 11/17/18 1120  GLUCAP 94 214* 130* 233* 89   Lipid Profile:  No results for input(s): CHOL, HDL, LDLCALC, TRIG, CHOLHDL, LDLDIRECT in the last 72 hours. Thyroid Function Tests: No results for input(s): TSH, T4TOTAL, FREET4, T3FREE, THYROIDAB in the last 72 hours. Anemia Panel: No results for input(s): VITAMINB12, FOLATE, FERRITIN, TIBC, IRON, RETICCTPCT in the last 72 hours. Urine analysis:    Component Value Date/Time   COLORURINE YELLOW 10/10/2018 2303   APPEARANCEUR CLEAR 10/10/2018 2303   LABSPEC 1.022 10/10/2018 2303   PHURINE 6.0 10/10/2018 2303   GLUCOSEU >=500 (A) 10/10/2018 2303   HGBUR NEGATIVE 10/10/2018 2303   BILIRUBINUR NEGATIVE 10/10/2018 2303   KETONESUR NEGATIVE 10/10/2018 2303   PROTEINUR NEGATIVE 10/10/2018 2303   UROBILINOGEN 4.0 (H) 12/05/2016 0921   NITRITE NEGATIVE 10/10/2018 2303   LEUKOCYTESUR NEGATIVE 10/10/2018 2303     Ripudeep Rai M.D. [facility] 11/17/2018, 1:45 PM  Pager: [phone] Between 7am to 7pm - call Pager - [phone]  After 7pm go to [email or web address] - password [email or web address]  Call night coverage person covering after 7pm

## 2018-11-17 NOTE — Progress Notes (Addendum)
[place] for Heparin  Indication: PE, DVT  Allergies  Allergen Reactions  . Lisinopril Anaphylaxis   Patient Measurements: Height: 5\' 11"  (180.3 cm) Weight: 123 lb 14.4 oz (56.2 kg) IBW/kg (Calculated) : 75.3 Heparin Dosing Weight: 66.7  Vital Signs: Temp: 98 F (36.7 C) (02/26 0616) Temp Source: Oral (02/26 0616) BP: 125/96 (02/26 0616) Pulse Rate: 95 (02/26 0616)  Labs: Recent Labs    11/15/18 0700 11/16/18 0109 11/17/18 0408  HGB 8.1* 10.3* 10.9*  HCT 25.8* 31.9* 35.8*  PLT 343 363 371  HEPARINUNFRC 0.52 0.67 0.66  CREATININE 0.48* 0.48* 0.57*   Estimated Creatinine Clearance: 81 mL/min (A) (by C-G formula based on SCr of 0.57 mg/dL (L)).  Assessment: 58 yo male with arterial embolus s/p embolectomy. Patient had development of hematoma with compartment syndrome as well as hemorrhagic shock secondary to a bleeding duodenal ulcer.   Events this admission on 09/03/18:  LLE DVT/ischemia s/p urgent left popliteal artery embolectomy 12/13 >> RLE hematoma on 12/15 am s/p evacuation in OR.Heparin stopped 2/2 drop in hgb, hematemesis. -Heparin cautiously started 12/22,had few episodes of hematemesis & nose bleed 12/24> stopped heparin per dr. Candiss Norse S/p EUS 12/26:  pancreatic mass eroding into duodenum -VVS notes ikely too high risk for [facility]  12/28- Dr. Candiss Norse noted Continue PPI and Hold Heparin indefinitely  2/5: pharmacy consulted to dose heparin with no bolus by Dr Marin Olp for acute PE. Previously on 12/24 his heparin level was therapeutic at 0.35 on 1200 units/hr. 2/7: Heparin off at 09 am for EDG, heparin to resume after NG placed per GI instructions. KUB 1629> NG in stomach 2/13: Expl lap & Gastrojejunostomy- not able to use jejunostomy for enteral feeds. 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

## 2018-11-17 NOTE — Progress Notes (Signed)
Physical Therapy Discharge Patient Details Name: Ryan Medina MRN: [id number] DOB: [birth year] Today's Date: 11/17/2018 Time:  -     Patient discharged from PT services secondary to surgery - will need to re-order PT to resume therapy services. Notes indicate to go to [facility] 11/18/2018  for amputation.  Please see latest therapy progress note for current level of functioning and progress toward goals.    Patient has tolerated limited ambulation NWB on right leg due to pain.  GP [place] Pager [phone] Office [phone]       Claretha Cooper 11/17/2018, 11:15 AM

## 2018-11-18 LAB — CBC
HCT: 32.2 % — ABNORMAL LOW (ref 39.0–52.0)
HEMOGLOBIN: 10 g/dL — AB (ref 13.0–17.0)
MCH: 29.1 pg (ref 26.0–34.0)
MCHC: 31.1 g/dL (ref 30.0–36.0)
MCV: 93.6 fL (ref 80.0–100.0)
Platelets: 341 10*3/uL (ref 150–400)
RBC: 3.44 MIL/uL — AB (ref 4.22–5.81)
RDW: 16.1 % — ABNORMAL HIGH (ref 11.5–15.5)
WBC: 7 10*3/uL (ref 4.0–10.5)
nRBC: 0 % (ref 0.0–0.2)

## 2018-11-18 LAB — HEPARIN LEVEL (UNFRACTIONATED): Heparin Unfractionated: 0.58 IU/mL (ref 0.30–0.70)

## 2018-11-18 LAB — GLUCOSE, CAPILLARY
GLUCOSE-CAPILLARY: 147 mg/dL — AB (ref 70–99)
GLUCOSE-CAPILLARY: 136 mg/dL — AB (ref 70–99)
Glucose-Capillary: 151 mg/dL — ABNORMAL HIGH (ref 70–99)
Glucose-Capillary: 194 mg/dL — ABNORMAL HIGH (ref 70–99)

## 2018-11-18 LAB — SURGICAL PCR SCREEN
MRSA, PCR: NEGATIVE
Staphylococcus aureus: NEGATIVE

## 2018-11-18 MED ORDER — CEFAZOLIN SODIUM-DEXTROSE 2-4 GM/100ML-% IV SOLN
2.0000 g | INTRAVENOUS | Status: AC
  Administered 2018-11-19: 2 g via INTRAVENOUS
  Filled 2018-11-18 (×2): qty 100

## 2018-11-18 MED ORDER — HEPARIN (PORCINE) 25000 UT/250ML-% IV SOLN
1300.0000 [IU]/h | INTRAVENOUS | Status: DC
  Administered 2018-11-18 – 2018-11-21 (×4): 1300 [IU]/h via INTRAVENOUS
  Filled 2018-11-18 (×10): qty 250

## 2018-11-18 NOTE — H&P (View-Only) (Signed)
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  The patient is to be transferred to [facility] today.   He has a nonsalvageable right foot which is gangrenous.  I have written preop orders.  [name], MD, [place] [phone] Office: [phone]

## 2018-11-18 NOTE — Progress Notes (Signed)
Report called to Iona Beard, Therapist, sports at [facility].  All belongings sent with pt including cell phone.    Danton Clap, RN

## 2018-11-18 NOTE — Progress Notes (Signed)
Pt discharged with [facility] to [facility].  Report given to [facility].  Danton Clap, RN

## 2018-11-18 NOTE — Progress Notes (Signed)
Ryan Medina has agreed to surgery.  Hopefully, he will have this tomorrow.  It sounds like he may go over to [place] [facility] to have this done.  Once he has the surgery and this gangrenous foot is removed, then we will be in a much better position to give him systemic therapy for his pancreatic cancer.  He is still eating soft food.  He is having no nausea or vomiting.  He is having bowel movements.  There is no obvious melena or bright red blood per rectum.  His labs today show a white cell count of 7 hemoglobin 10 platelet count 341,000.  He has had no fever.  He has had no cough.  His vital signs all look stable.  His blood pressure is 126/100.  Pulse is 97.  Temperature 98.1.  Oral exam shows no thrush.  He has relatively clear lungs bilaterally.  Is good breath sounds bilaterally.  Cardiac exam regular rate and rhythm.  Abdomen is soft.  He has a laparotomy wound that is well-healed.  He has had no fluid wave.  There is no obvious abdominal mass.  There is no palpable liver or spleen tip.  Extremities shows symmetric muscle atrophy in upper lower extremities.  He has a dressing on the right foot.  Again, I hope and suspect that Ryan Medina will have the right foot amputation tomorrow.  I think this is going to be performed at [facility].  We will continue to follow along.  I know that he is gotten fantastic care from all the staff up on [name]   [name], MD  [address] [place] 4:14

## 2018-11-18 NOTE — Progress Notes (Signed)
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  The patient is to be transferred to [facility] today.   He has a nonsalvageable right foot which is gangrenous.  I have written preop orders.  [name], MD, [place] [phone] Office: [phone]

## 2018-11-18 NOTE — Progress Notes (Signed)
[place] for Heparin  Indication: PE, DVT  Allergies  Allergen Reactions  . Lisinopril Anaphylaxis   Patient Measurements: Height: 5\' 11"  (180.3 cm) Weight: 123 lb 14.4 oz (56.2 kg) IBW/kg (Calculated) : 75.3 Heparin Dosing Weight: 66.7  Vital Signs: Temp: 98.1 F (36.7 C) (02/27 0447) Temp Source: Oral (02/27 0447) BP: 126/100 (02/27 0447) Pulse Rate: 97 (02/27 0447)  Labs: Recent Labs    11/16/18 0109 11/17/18 0408 11/18/18 0318  HGB 10.3* 10.9* 10.0*  HCT 31.9* 35.8* 32.2*  PLT 363 371 341  HEPARINUNFRC 0.67 0.66 0.58  CREATININE 0.48* 0.57*  --    Estimated Creatinine Clearance: 81 mL/min (A) (by C-G formula based on SCr of 0.57 mg/dL (L)).  Assessment: 58 yo male with arterial embolus s/p embolectomy. Patient had development of hematoma with compartment syndrome as well as hemorrhagic shock secondary to a bleeding duodenal ulcer.   Events this admission on 09/03/18:  LLE DVT/ischemia s/p urgent left popliteal artery embolectomy 12/13 >> RLE hematoma on 12/15 am s/p evacuation in OR.Heparin stopped 2/2 drop in hgb, hematemesis. -Heparin cautiously started 12/22,had few episodes of hematemesis & nose bleed 12/24> stopped heparin per dr. Candiss Norse S/p EUS 12/26:  pancreatic mass eroding into duodenum -VVS notes ikely too high risk for [facility]  12/28- Dr. Candiss Norse noted Continue PPI and Hold Heparin indefinitely  2/5: pharmacy consulted to dose heparin with no bolus by Dr Marin Olp for acute PE. Previously on 12/24 his heparin level was therapeutic at 0.35 on 1200 units/hr. 2/7: Heparin off at 09 am for EDG, heparin to resume after NG placed per GI instructions. KUB 1629> NG in stomach 2/13: Expl lap & Gastrojejunostomy- not able to use jejunostomy for enteral feeds. Heparin resumed at 2000 at rate of 1600 units/hr 2/14 heparin tx to lovenox (heparin at 1700 units/hr) 2/19 lovenox to heparin in anticipation of colonoscopy 2/21 heparin held at  0300 for EGD, HL 0.56 on 1400 units/hr  11/18/2018   Heparin level of 0.58 is slightly above low goal of 0.3 - 0.5 after heparin decreased to1350 units/hr yesterday  Hg 10, 2 U PRBC 2/24, Plts stable  No reported bleeding   Goal of Therapy:  Monitor platelets by anticoagulation protocol: Yes Heparin level 0.3-0.5 units/ml   Plan:   decrease IV heparin drip to 1300 units/hr  Daily CBC and HL   Monitor for bleeding  R BKA scheduled for 2/28 0715 am at [facility]  - no orders yet for stopping heparin pre-op  Eudelia Bunch, Pharm.D [phone] 11/18/2018 8:13 AM

## 2018-11-18 NOTE — Progress Notes (Signed)
[facility]                                                                              Patient Demographics  Ryan Medina, is a 58 y.o. male, DOB - 12-08-1960, MEQ:[id number]  Admit date - 09/03/2018   Admitting Physician [name], MD  Outpatient Primary MD for the patient is Patient, No Pcp Per  Outpatient specialists:   LOS - 76  days   Medical records reviewed and are as summarized below:    Chief Complaint  Patient presents with  . Numbness       Brief summary   58 year old with history of peripheral vascular disease, dilated cardiomyopathy, essential hypertension, diabetes mellitus type 2 initially came to the hospital with right lower extremity pain and was found to have ischemic leg requiring embolectomy on 12/13. He was placed on heparin drip subsequently developed right lower extremity hematoma requiring fasciotomy on 12/15. Unfortunately afterwards he decompensated with active GI bleed and was intubated and was moved to the ICU. He required embolization on 12/17. During the course of his hospitalization he is required total of 25 units of blood. Further evaluation showed pancreatic cancer with duodenal ulcer. Transferred to [place] long for radiation treatment to help with bleeding. Hospital course complicated by sepsis from healthcare acquired pneumonia and Pseudomonas UTI requiring treatment with vancomycin and cefepime.Patient found to have chronic PE therefore started on heparin drip per oncology. Repeat endoscopy showed worsening of gastric outlet obstruction. Initially NG tube placed by patient removed it.  Currently tolerating soft diet.  Assessment & Plan     Right foot ischemic with right popliteal and tibial artery embolus/gangrene -Status post embolectomy, complicated by compartment syndrome requiring fasciotomy -Vascular surgery was reconsulted, seen in consultation by Dr. Scot Dock, recommended BKA as the right foot is  not salvageable -Continue anticoagulation with IV heparin -Patient has planned right BKA tomorrow morning at [facility], Dr. Scot Dock has placed preop orders -Patient will be transferred to [facility] today, medically stable  Hematochezia: Secondary to ulceration at the gastrojejunostomy area -Currently no acute issues, patient had been complaining of BRPR since surgery was done on 11/04/2018. -Patient was followed by oncology who requested GI evaluation prior to initiation of chemotherapy for the hematochezia and anemia -Patient was changed to IV heparin per oncology.  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   -Continue PPI, Carafate, currently tolerating soft diet, no acute issues  Pancreatic adenocarcinoma Hemorrhagic shock due to GI bleed from duodenal ulcer-> currently stable Gastric outlet obstruction causing dysphagia, abdominal distention -CT showed increase in size of the mass with involvement of intra-abdominal arteries deemed to be inoperable, status post radiation.  -Oncology following.  Per  Dr. Marin Olp, plans for outpatient radiation and chemo   Adenocarcinoma of pancreas Hemorrhagic shock due to GI bleed from duodenal ulcer, stable Gastric outlet obstruction causing dysphagia and abdominal distention -CT scan showed decreased enhancement in the mid pancreatic body suggesting residual/recurrent neoplasm, involvement of  intra-abdominal arteries, deemed to be inoperable.  -Status post XRT, per oncology plans for outpatient radiation and chemo -Status post EGD 12/17, GDA embolization 12/17, status post XRT. EGD showed worsening of gastric outlet obstruction/pyloric stenosis contrary to malignancy, candida esophagitis -Continue PPI -Currently tolerating soft diet -Patient has a port, TPN has been weaned off -EGD by Dr. Arnette Felts on 2/21 showed normal esophagus, no varices, small lateral hernia, gastrojejunostomy with ulceration, no bleeding though likely source of recent hematochezia, gastric stenosis at the pylorus from known invasive adenocarcinoma -H&H currently stable, patient received total of 27 units of packed RBCs during this hospitalization -Continue PPI, Carafate   Candida esophagitis -Patient has completed full course of Diflucan  Chronic bilateral pulmonary embolism -Patient was placed on Lovenox however discontinued on 11/10/2018, placed on heparin due to rectal bleeding -Per oncology  Acute blood loss anemia with history of anemia of chronic disease -Status post multiple packed RBC transfusions, total of 27 units during hospitalization -Currently H&H stable, hemoglobin 10.0  Sepsis secondary to HCAP, Pseudomonas UTI Status post full course of IV vancomycin and cefepime, PICC line pulled out 1/26  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 -Currently euvolemic, no acute issues  Uncontrolled diabetes mellitus type 2 with hyperglycemia, vascular issues -CBGs stable, continue Lantus, sliding scale insulin, hemoglobin A1c 18.2  Severe protein calorie malnutrition TPN has been weaned off, started on soft diet and tolerating  Generalized debility -PT recommended skilled nursing facility after acute issues are resolved  Code Status: Partial DVT Prophylaxis: Heparin drip Family Communication: Discussed in detail with the patient, all imaging results, lab results  explained to the patient     Disposition Plan: Transferring to [facility] today for right BKA planned tomorrow as per vascular surgery recommendations.  Patient will need skilled nursing facility after stable from the surgery.  Patient is homeless and likely not a suitable candidate for shelter at this time due to ongoing medical issues. Signout given to [facility] Alfredia Ferguson at [facility].   Time Spent in minutes   25 minutes  Procedures:          Laparoscopy, open gastrojejunostomy per Dr. Hassell Done 11/04/2018  Endoscopy 09/07/2018  EUS 09/16/2018  Chemo-port placement 10/12/2018  Repeat endoscopy 10/29/2018--per Dr. Loletha Carrow III  Repeat upper endoscopy 11/12/2018 per Dr. Hilarie Fredrickson  2 units packed red blood cells 11/15/2018.   Consultants:    Gastroenterology  Oncology  Conventional radiology  Pulmonary and critical care medicine  Vascular surgery  Palliative care  General surgery  Cardiology  Antimicrobials:   Anti-infectives (From admission, onward)   Start     Dose/Rate Route Frequency Ordered Stop   11/19/18 0600  ceFAZolin (ANCEF) IVPB 2g/100 mL premix    Note to Pharmacy:  Send with pt to OR   2 g 200 mL/hr over 30 Minutes Intravenous On call 11/18/18 0834 11/20/18 0600   11/05/18 0930  cefoTEtan (CEFOTAN) 2 g in sodium chloride 0.9 % 100 mL IVPB  Status:  Discontinued     2 g 200 mL/hr over 30 Minutes Intravenous  Once 11/04/18 1237 11/04/18 1323   11/04/18 2130  cefoTEtan (CEFOTAN) 2 g in sodium chloride 0.9 % 100 mL IVPB     2 g  200 mL/hr over 30 Minutes Intravenous  Once 11/04/18 1323 11/04/18 2128   11/04/18 0815  cefoTEtan (CEFOTAN) 2 g in sodium chloride 0.9 % 100 mL IVPB     2 g 200 mL/hr over 30 Minutes Intravenous On call to O.R. 11/03/18 1135 11/04/18 0931   11/01/18 1000  fluconazole (DIFLUCAN) tablet 100 mg  Status:  Discontinued     100 mg Oral Daily 11/01/18 0828 11/04/18 1237   10/29/18 1600  fluconazole (DIFLUCAN) IVPB 100 mg  Status:  Discontinued      100 mg 50 mL/hr over 60 Minutes Intravenous Every 24 hours 10/29/18 1529 11/01/18 0828   10/11/18 2300  vancomycin (VANCOCIN) IVPB 750 mg/150 ml premix  Status:  Discontinued     750 mg 150 mL/hr over 60 Minutes Intravenous Every 12 hours 10/11/18 1037 10/13/18 1954   10/11/18 1500  ceFAZolin (ANCEF) IVPB 2g/100 mL premix     2 g 200 mL/hr over 30 Minutes Intravenous To Radiology 10/08/18 1348 10/12/18 1500   10/11/18 1200  ceFEPIme (MAXIPIME) 1 g in sodium chloride 0.9 % 100 mL IVPB  Status:  Discontinued     1 g 200 mL/hr over 30 Minutes Intravenous Every 8 hours 10/11/18 1037 10/13/18 1954   10/11/18 1045  vancomycin (VANCOCIN) 1,250 mg in sodium chloride 0.9 % 250 mL IVPB     1,250 mg 166.7 mL/hr over 90 Minutes Intravenous  Once 10/11/18 1037 10/11/18 2057   10/07/18 1600  ceFEPIme (MAXIPIME) 1 g in sodium chloride 0.9 % 100 mL IVPB     1 g 200 mL/hr over 30 Minutes Intravenous Every 8 hours 10/07/18 1452 10/08/18 0105   10/06/18 1000  fluconazole (DIFLUCAN) tablet 100 mg  Status:  Discontinued     100 mg Oral Daily 10/06/18 0709 10/11/18 0834   10/02/18 2100  vancomycin (VANCOCIN) IVPB 750 mg/150 ml premix  Status:  Discontinued     750 mg 150 mL/hr over 60 Minutes Intravenous Every 12 hours 10/02/18 0812 10/04/18 1420   10/02/18 0900  vancomycin (VANCOCIN) 1,250 mg in sodium chloride 0.9 % 250 mL IVPB     1,250 mg 166.7 mL/hr over 90 Minutes Intravenous  Once 10/02/18 0812 10/02/18 1132   10/02/18 0900  ceFEPIme (MAXIPIME) 1 g in sodium chloride 0.9 % 100 mL IVPB  Status:  Discontinued     1 g 200 mL/hr over 30 Minutes Intravenous Every 8 hours 10/02/18 0812 10/07/18 0925   10/02/18 0815  ceFEPIme (MAXIPIME) 1 g in sodium chloride 0.9 % 100 mL IVPB  Status:  Discontinued     1 g 200 mL/hr over 30 Minutes Intravenous Every 8 hours 10/02/18 0800 10/02/18 0812   10/02/18 0800  ceFEPIme (MAXIPIME) 2 g in sodium chloride 0.9 % 100 mL IVPB  Status:  Discontinued     2 g 200 mL/hr  over 30 Minutes Intravenous  Once 10/02/18 0757 10/02/18 0800   10/02/18 0800  metroNIDAZOLE (FLAGYL) IVPB 500 mg  Status:  Discontinued     500 mg 100 mL/hr over 60 Minutes Intravenous Every 8 hours 10/02/18 0757 10/02/18 0800   10/02/18 0800  vancomycin (VANCOCIN) IVPB 1000 mg/200 mL premix  Status:  Discontinued     1,000 mg 200 mL/hr over 60 Minutes Intravenous  Once 10/02/18 0757 10/02/18 0800   09/27/18 0000  ceFAZolin (ANCEF) IVPB 2g/100 mL premix     2 g 200 mL/hr over 30 Minutes Intravenous To Radiology 09/25/18 0902 09/28/18 0000   09/10/18 2130  ceFAZolin (ANCEF) IVPB 2g/100 mL premix     2 g 200 mL/hr over 30 Minutes Intravenous Every 8 hours 09/10/18 1634 09/11/18 0700   09/10/18 0800  ceFAZolin (ANCEF) IVPB 1 g/50 mL premix    Note to Pharmacy:  Send with pt to OR   1 g 100 mL/hr over 30 Minutes Intravenous On call 09/09/18 0756 09/10/18 1407   09/07/18 1300  erythromycin 250 mg in sodium chloride 0.9 % 100 mL IVPB     250 mg 100 mL/hr over 60 Minutes Intravenous Once 09/07/18 1125 09/07/18 2351   09/06/18 0000  ceFAZolin (ANCEF) IVPB 1 g/50 mL premix    Note to Pharmacy:  Send with pt to OR   1 g 100 mL/hr over 30 Minutes Intravenous On call 09/05/18 0849 09/05/18 0952   09/03/18 2245  ceFAZolin (ANCEF) IVPB 2g/100 mL premix     2 g 200 mL/hr over 30 Minutes Intravenous Every 8 hours 09/03/18 2236 09/04/18 1444   09/03/18 1615  ceFAZolin (ANCEF) IVPB 2g/100 mL premix  Status:  Discontinued     2 g 200 mL/hr over 30 Minutes Intravenous On call to O.R. 09/03/18 1600 09/03/18 2035         Medications  Scheduled Meds: . acetaminophen  1,000 mg Oral Q8H  . feeding supplement (ENSURE ENLIVE)  237 mL Oral TID BM  . heparin lock flush  500 Units Intracatheter Q30 days  . insulin aspart  0-20 Units Subcutaneous TID WC  . insulin aspart  0-5 Units Subcutaneous QHS  . pantoprazole  40 mg Oral BID  . sucralfate  1 g Oral TID WC & HS   Continuous Infusions: . sodium  chloride 10 mL/hr at 11/17/18 1500  . [START ON 11/19/2018]  ceFAZolin (ANCEF) IV    . heparin 1,300 Units/hr (11/18/18 0824)   PRN Meds:.sodium chloride, heparin lock flush **AND** heparin lock flush, HYDROmorphone (DILAUDID) injection, ipratropium-albuterol, lip balm, LORazepam, menthol-cetylpyridinium, metoprolol tartrate, ondansetron (ZOFRAN) IV, oxyCODONE, phenol, sodium chloride flush      Subjective:   Arihant Bajorek was seen and examined today.  No complaints, frustrated with his breakfast tray.  Otherwise denies any dizziness, chest pain, shortness of breath, abdominal pain, N/V/D/C, new weakness, numbess, tingling.  Agreeing for the surgery.  No acute issues overnight.  No fevers  Objective:   Vitals:   11/17/18 0616 11/17/18 1328 11/17/18 2138 11/18/18 0447  BP: (!) 125/96 (!) 135/110 (!) 131/104 (!) 126/100  Pulse: 95 (!) 103 95 97  Resp: 19 20 20 20   Temp: 98 F (36.7 C) 98.4 F (36.9 C) 98.1 F (36.7 C) 98.1 F (36.7 C)  TempSrc: Oral Oral Oral Oral  SpO2: 99% 100% 100% 100%  Weight:      Height:        Intake/Output Summary (Last 24 hours) at 11/18/2018 1039 Last data filed at 11/18/2018 7412 Gross per 24 hour  Intake 2103.96 ml  Output 1000 ml  Net 1103.96 ml     Wt Readings from Last 3 Encounters:  11/12/18 56.2 kg  08/24/18 66.7 kg  08/15/18 66.7 kg     Physical Exam  General: Alert and oriented x 3, NAD  Eyes:   HEENT:  Atraumatic, normocephalic  Cardiovascular: S1 S2 clear, RRR. No pedal edema b/l  Respiratory: CTAB, no wheezing, rales or rhonchi  Gastrointestinal: Soft, nontender, nondistended, NBS, Steri-Strips in place, staples have been removed, midabdominal surgical wound CDI  Ext: no pedal edema bilaterally  Neuro: no new deficits  Musculoskeletal: No cyanosis, clubbing  Skin: Gangrenous right foot  Psych: Normal affect and demeanor, alert and oriented x3    Data Reviewed:  I have personally reviewed following labs and  imaging studies  Micro Results No results found for this or any previous visit (from the past 240 hour(s)).  Radiology Reports Dg Abd 1 View  Result Date: 11/07/2018 CLINICAL DATA:  Abdominal pain. EXAM: ABDOMEN - 1 VIEW COMPARISON:  October 29, 2018 FINDINGS: Moderate fecal loading in the colon. No evidence of bowel obstruction. There is a paucity of small bowel gas but no evidence of obstruction. No other acute abnormalities. IMPRESSION: Moderate fecal loading in the colon. No other acute abnormalities noted. Electronically Signed   By: Dorise Bullion III M.D   On: 11/07/2018 18:37   Ct Chest W Contrast  Addendum Date: 10/27/2018   ADDENDUM REPORT: 10/27/2018 13:41 ADDENDUM: The original report was by Dr. Van Clines. The following addendum is by Dr. Van Clines: Critical Value/emergent results were called by telephone at the time of interpretation on 10/27/2018 at 1:40 pm to Dr. Gerlean Ren , who verbally acknowledged these results. Electronically Signed   By: Van Clines M.D.   On: 10/27/2018 13:41   Result Date: 10/27/2018 CLINICAL DATA:  Pancreatic adenocarcinoma with duodenal ulcer, chemotherapy and radiation therapy in progress. EXAM: CT CHEST, ABDOMEN, AND PELVIS WITH CONTRAST TECHNIQUE: Multidetector CT imaging of the chest, abdomen and pelvis was performed following the standard protocol during bolus administration of intravenous contrast. CONTRAST:  160mL OMNIPAQUE IOHEXOL 300 MG/ML  SOLN COMPARISON:  Multiple exams, including CT abdomen from 09/27/2018 FINDINGS: CT CHEST FINDINGS Cardiovascular: Today's exam was not performed for pulmonary arterial assessment, but there is mostly peripheral and segmental right lower lobe filling defect in the pulmonary artery compatible with pulmonary embolus, likely chronic. Similar peripheral segmental and subsegmental filling defects in the left upper lobe and left lower lobe pulmonary arteries. Right Port-A-Cath tip: Cavoatrial junction.  Atherosclerotic calcification of the aortic arch and branch vessels. Mediastinum/Nodes: Hazy density in the mediastinal adipose tissue as well as the subcutaneous adipose tissue possibly from third spacing of fluid. Right lower paratracheal node 1.1 cm in short axis on image 42/7. Lungs/Pleura: Bandlike scarring or atelectasis in the left lower lobe at the site of prior airspace opacity, mildly improved although there is some continued anterior airspace opacity. Musculoskeletal: Small oblong sclerotic lesion in the left eighth rib lateral, not changed from 08/22/2018 common no cortical irregularity or cortical destruction. CT ABDOMEN PELVIS FINDINGS Hepatobiliary: Lateral segment left hepatic lobe cyst on image 88/7. Potential 4 mm hypodense lesion in the right hepatic lobe on image 102/7, technically nonspecific. Pancreas: Highly ill-defined pancreas. Indistinct stranding around the celiac trunk and superior mesenteric artery. Hypodensity favoring pancreatic body mass measuring about 2.4 by 4.1 cm on image 108/7. Dilated dorsal pancreatic duct. Spleen: Unremarkable. Adrenals/Urinary Tract: Small hypodense renal lesions are technically too small to characterize although statistically likely to be benign. Adrenal glands unremarkable. Stomach/Bowel: Prominently dilated stomach filled with food material. Poor definition of the duodenum. I can not exclude gastric outlet obstruction. Presumed vascular coils in the gastroduodenal artery. Poor definition of bowel wall due to the diffuse edema. Vascular/Lymphatic: Mild gastric varices. Infrarenal abdominal aortic aneurysm 3.1 cm in diameter. Aneurysmal bilateral common iliac arteries. Ectatic external iliac arteries. The portal vein and SMV remain patent. Varices are noted along the omentum and stomach. Reproductive: Unremarkable Other: Diffuse subcutaneous and mesenteric edema. Musculoskeletal: Spondylosis and degenerative disc disease at L5-S1. IMPRESSION:  1. Chronic  appearing bilateral pulmonary embolus most notably involving the right lower lobe. 2. Mild enlargement of the pancreatic mass. No findings of metastatic disease to the liver currently. Small cyst in the lateral segment left hepatic lobe. 3. Notable dilatation of the stomach which is filled with food. This raises the possibility of gastric outlet obstruction or narrowing along the descending duodenum. No bowel dilatation. 4. Third spacing of fluid with diffuse subcutaneous, mesenteric, and mediastinal edema. 5. Gastric and omental varices likely from portal venous hypertension. 6. Mild aneurysmal dilatation of the abdominal aorta at 3.1 cm with aneurysmal iliac arteries. Recommend followup by [place] in 3 years. This recommendation follows ACR consensus guidelines: White Paper of the ACR Incidental Findings Committee II on Vascular Findings. J Am Coll Radiol 2013; 10:789-794. 7.  Aortic Atherosclerosis (ICD10-I70.0). Radiology assistant personnel have been notified to put me in telephone contact with the referring physician or the referring physician's clinical representative in order to discuss these findings. Once this communication is established I will issue an addendum to this report for documentation purposes. Electronically Signed: By: Van Clines M.D. On: 10/27/2018 13:00   Ct Abdomen Pelvis W Contrast  Addendum Date: 10/27/2018   ADDENDUM REPORT: 10/27/2018 13:41 ADDENDUM: The original report was by Dr. Van Clines. The following addendum is by Dr. Van Clines: Critical Value/emergent results were called by telephone at the time of interpretation on 10/27/2018 at 1:40 pm to Dr. Gerlean Ren , who verbally acknowledged these results. Electronically Signed   By: Van Clines M.D.   On: 10/27/2018 13:41   Result Date: 10/27/2018 CLINICAL DATA:  Pancreatic adenocarcinoma with duodenal ulcer, chemotherapy and radiation therapy in progress. EXAM: CT CHEST, ABDOMEN, AND PELVIS WITH CONTRAST  TECHNIQUE: Multidetector CT imaging of the chest, abdomen and pelvis was performed following the standard protocol during bolus administration of intravenous contrast. CONTRAST:  135mL OMNIPAQUE IOHEXOL 300 MG/ML  SOLN COMPARISON:  Multiple exams, including CT abdomen from 09/27/2018 FINDINGS: CT CHEST FINDINGS Cardiovascular: Today's exam was not performed for pulmonary arterial assessment, but there is mostly peripheral and segmental right lower lobe filling defect in the pulmonary artery compatible with pulmonary embolus, likely chronic. Similar peripheral segmental and subsegmental filling defects in the left upper lobe and left lower lobe pulmonary arteries. Right Port-A-Cath tip: Cavoatrial junction. Atherosclerotic calcification of the aortic arch and branch vessels. Mediastinum/Nodes: Hazy density in the mediastinal adipose tissue as well as the subcutaneous adipose tissue possibly from third spacing of fluid. Right lower paratracheal node 1.1 cm in short axis on image 42/7. Lungs/Pleura: Bandlike scarring or atelectasis in the left lower lobe at the site of prior airspace opacity, mildly improved although there is some continued anterior airspace opacity. Musculoskeletal: Small oblong sclerotic lesion in the left eighth rib lateral, not changed from 08/22/2018 common no cortical irregularity or cortical destruction. CT ABDOMEN PELVIS FINDINGS Hepatobiliary: Lateral segment left hepatic lobe cyst on image 88/7. Potential 4 mm hypodense lesion in the right hepatic lobe on image 102/7, technically nonspecific. Pancreas: Highly ill-defined pancreas. Indistinct stranding around the celiac trunk and superior mesenteric artery. Hypodensity favoring pancreatic body mass measuring about 2.4 by 4.1 cm on image 108/7. Dilated dorsal pancreatic duct. Spleen: Unremarkable. Adrenals/Urinary Tract: Small hypodense renal lesions are technically too small to characterize although statistically likely to be benign. Adrenal  glands unremarkable. Stomach/Bowel: Prominently dilated stomach filled with food material. Poor definition of the duodenum. I can not exclude gastric outlet obstruction. Presumed vascular coils in the gastroduodenal artery. Poor  definition of bowel wall due to the diffuse edema. Vascular/Lymphatic: Mild gastric varices. Infrarenal abdominal aortic aneurysm 3.1 cm in diameter. Aneurysmal bilateral common iliac arteries. Ectatic external iliac arteries. The portal vein and SMV remain patent. Varices are noted along the omentum and stomach. Reproductive: Unremarkable Other: Diffuse subcutaneous and mesenteric edema. Musculoskeletal: Spondylosis and degenerative disc disease at L5-S1. IMPRESSION: 1. Chronic appearing bilateral pulmonary embolus most notably involving the right lower lobe. 2. Mild enlargement of the pancreatic mass. No findings of metastatic disease to the liver currently. Small cyst in the lateral segment left hepatic lobe. 3. Notable dilatation of the stomach which is filled with food. This raises the possibility of gastric outlet obstruction or narrowing along the descending duodenum. No bowel dilatation. 4. Third spacing of fluid with diffuse subcutaneous, mesenteric, and mediastinal edema. 5. Gastric and omental varices likely from portal venous hypertension. 6. Mild aneurysmal dilatation of the abdominal aorta at 3.1 cm with aneurysmal iliac arteries. Recommend followup by [place] in 3 years. This recommendation follows ACR consensus guidelines: White Paper of the ACR Incidental Findings Committee II on Vascular Findings. J Am Coll Radiol 2013; 10:789-794. 7.  Aortic Atherosclerosis (ICD10-I70.0). Radiology assistant personnel have been notified to put me in telephone contact with the referring physician or the referring physician's clinical representative in order to discuss these findings. Once this communication is established I will issue an addendum to this report for documentation purposes.  Electronically Signed: By: Van Clines M.D. On: 10/27/2018 13:00   Dg Abd Portable 1v  Result Date: 10/29/2018 CLINICAL DATA:  Follow-up gastric catheter placement EXAM: PORTABLE ABDOMEN - 1 VIEW COMPARISON:  None. FINDINGS: Gastric catheter is noted within the stomach. Changes of prior embolus therapy are noted in the right upper quadrant. Contrast material is noted scattered throughout the colon related to the recent CT examination. Mild retained fecal material is noted. IMPRESSION: Gastric catheter within the stomach. Electronically Signed   By: Inez Catalina M.D.   On: 10/29/2018 16:34   [place] Ekg Site Rite  Result Date: 11/05/2018 If Site Rite image not attached, placement could not be confirmed due to current cardiac rhythm.  Ct Angio Abd/pel W/ And/or W/o  Result Date: 11/10/2018 CLINICAL DATA:  Pancreatic carcinoma with partial duodenal obstruction, gastric varices. GI bleed since gastrojejunostomy on 11/04/18^ EXAM: CTA ABDOMEN AND PELVIS wITHOUT AND WITH CONTRAST TECHNIQUE: Multidetector CT imaging of the abdomen and pelvis was performed using the standard protocol during bolus administration of intravenous contrast. Multiplanar reconstructed images and MIPs were obtained and reviewed to evaluate the vascular anatomy. CONTRAST:  123mL ISOVUE-370 IOPAMIDOL (ISOVUE-370) INJECTION 76% COMPARISON:  10/27/2018 FINDINGS: VASCULAR Aorta: Fusiform abdominal aortic aneurysm measured up to 3.1 cm maximum diameter, stable. No significant intraluminal thrombus. No dissection or stenosis. Celiac: Patent. The left gastric artery arises directly from the abdominal aorta, an anatomic variant. Metallic gastroduodenal embolization coils without evidence of recanalization. SMA: Patent without evidence of aneurysm, dissection, vasculitis or significant stenosis. Renals: Both renal arteries are patent without evidence of aneurysm, dissection, vasculitis, fibromuscular dysplasia or significant stenosis. IMA:  Short-segment origin stenosis. Distally patent without evidence of aneurysm, dissection, vasculitis or significant stenosis. Inflow: Fusiform dilatation of the right common iliac artery up to 2.9 cm diameter. Fusiform dilatation of left common iliac artery up to 2.7 cm diameter with some eccentric nonocclusive mural thrombus. Tortuous ectatic distal iliac arterial systems without significant stenosis. Proximal Outflow: Bilateral common femoral and visualized portions of the superficial and profunda femoral arteries are patent without  evidence of aneurysm, dissection, vasculitis or significant stenosis. Veins: Apparent splenic venous occlusion with collaterals from the splenic hilum extending along the anterior mesentery. Streak artifact from GDA embolization coils obscures visualization of the portosplenic confluence. Main portal vein and intrahepatic branches are patent. Hepatic veins patent. Review of the MIP images confirms the above findings. NON-VASCULAR Lower chest: Chronic atelectasis/scarring in the left lower lobe. No pleural or pericardial effusion. Hepatobiliary: No focal liver abnormality is seen. No gallstones, gallbladder wall thickening, or biliary dilatation. Pancreas: Decreased enhancement in the mid pancreatic body. Atrophy in the pancreatic tail. Some image degradation through the pancreatic head secondary to regional embolization coils. Spleen: Normal in size without focal lesion. Adrenals/Urinary Tract: Adrenal glands are unremarkable. Kidneys are normal, without renal calculi, focal lesion, or hydronephrosis. Bladder is unremarkable. Stomach/Bowel: Stomach is nondilated. Gastrojejunostomy staple line in the lateral gastric body. No evidence of active extravasation. Small bowel is decompressed. Moderate proximal colonic and rectal fecal material without dilatation. Lymphatic: No definite abdominal or pelvic adenopathy, evaluation limited secondary to paucity of fat and diffuse anasarca.  Reproductive: Prostate is unremarkable. Other: No significant ascites. Scattered bubbles of free intraperitoneal air presumably postoperative. Musculoskeletal: Midline anterior abdominal wall skin staples. Lumbosacral spondylitic change. No fracture or worrisome bone lesion. IMPRESSION: VASCULAR 1. No evidence of active extravasation. 2. 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 3. 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 NON-VASCULAR 1. No acute findings. 2. Decreased enhancement in the mid pancreatic body suggesting residual/recurrent neoplasm. Electronically Signed   By: Lucrezia Europe M.D.   On: 11/10/2018 16:46    Lab Data:  CBC: Recent Labs  Lab 11/15/18 0500 11/15/18 0700 11/16/18 0109 11/17/18 0408 11/18/18 0318  WBC PATIENT IDENTIFICATION ERROR. PLEASE DISREGARD RESULTS. ACCOUNT WILL BE CREDITED. 8.1 8.8 7.1 7.0  HGB PATIENT IDENTIFICATION ERROR. PLEASE DISREGARD RESULTS. ACCOUNT WILL BE CREDITED. 8.1* 10.3* 10.9* 10.0*  HCT PATIENT IDENTIFICATION ERROR. PLEASE DISREGARD RESULTS. ACCOUNT WILL BE CREDITED. 25.8* 31.9* 35.8* 32.2*  MCV PATIENT IDENTIFICATION ERROR. PLEASE DISREGARD RESULTS. ACCOUNT WILL BE CREDITED. 92.8 89.6 94.0 93.6  PLT PATIENT IDENTIFICATION ERROR. PLEASE DISREGARD RESULTS. ACCOUNT WILL BE CREDITED. 343 363 371 469   Basic Metabolic Panel: Recent Labs  Lab 11/12/18 0334 11/13/18 0800 11/14/18 0511 11/15/18 0500 11/15/18 0700 11/16/18 0109 11/17/18 0408  NA 136 134* 134* PATIENT IDENTIFICATION ERROR. PLEASE DISREGARD RESULTS. ACCOUNT WILL BE CREDITED. 135 136 136  K 4.6 4.3 4.6 PATIENT IDENTIFICATION ERROR. PLEASE DISREGARD RESULTS. ACCOUNT WILL BE CREDITED. 4.2 4.0 3.8  CL 100 103 101 PATIENT IDENTIFICATION ERROR. PLEASE DISREGARD RESULTS. ACCOUNT WILL BE CREDITED. 101 101 103  CO2 29 28 24  PATIENT IDENTIFICATION ERROR. PLEASE DISREGARD RESULTS.  ACCOUNT WILL BE CREDITED. 25 27 26   GLUCOSE 142* 75 132* PATIENT IDENTIFICATION ERROR. PLEASE DISREGARD RESULTS. ACCOUNT WILL BE CREDITED. 227* 154* 254*  BUN 19 16 13  PATIENT IDENTIFICATION ERROR. PLEASE DISREGARD RESULTS. ACCOUNT WILL BE CREDITED. 12 11 16   CREATININE 0.40* 0.38* 0.42* PATIENT IDENTIFICATION ERROR. PLEASE DISREGARD RESULTS. ACCOUNT WILL BE CREDITED. 0.48* 0.48* 0.57*  CALCIUM 8.3* 8.0* 8.2* PATIENT IDENTIFICATION ERROR. PLEASE DISREGARD RESULTS. ACCOUNT WILL BE CREDITED. 8.1* 8.4* 8.4*  MG 2.1 1.8  --   --   --   --   --   PHOS 3.9 3.1  --   --   --   --   --    GFR: Estimated Creatinine Clearance: 81 mL/min (  A) (by C-G formula based on SCr of 0.57 mg/dL (L)). Liver Function Tests: No results for input(s): AST, ALT, ALKPHOS, BILITOT, PROT, ALBUMIN in the last 168 hours. No results for input(s): LIPASE, AMYLASE in the last 168 hours. No results for input(s): AMMONIA in the last 168 hours. Coagulation Profile: No results for input(s): INR, PROTIME in the last 168 hours. Cardiac Enzymes: No results for input(s): CKTOTAL, CKMB, CKMBINDEX, TROPONINI in the last 168 hours. BNP (last 3 results) No results for input(s): PROBNP in the last 8760 hours. HbA1C: No results for input(s): HGBA1C in the last 72 hours. CBG: Recent Labs  Lab 11/17/18 0713 11/17/18 1120 11/17/18 1538 11/17/18 2131 11/18/18 0744  GLUCAP 233* 89 186* 151* 147*   Lipid Profile: No results for input(s): CHOL, HDL, LDLCALC, TRIG, CHOLHDL, LDLDIRECT in the last 72 hours. Thyroid Function Tests: No results for input(s): TSH, T4TOTAL, FREET4, T3FREE, THYROIDAB in the last 72 hours. Anemia Panel: No results for input(s): VITAMINB12, FOLATE, FERRITIN, TIBC, IRON, RETICCTPCT in the last 72 hours. Urine analysis:    Component Value Date/Time   COLORURINE YELLOW 10/10/2018 2303   APPEARANCEUR CLEAR 10/10/2018 2303   LABSPEC 1.022 10/10/2018 2303   PHURINE 6.0 10/10/2018 2303   GLUCOSEU >=500 (A)  10/10/2018 2303   HGBUR NEGATIVE 10/10/2018 2303   BILIRUBINUR NEGATIVE 10/10/2018 2303   KETONESUR NEGATIVE 10/10/2018 2303   PROTEINUR NEGATIVE 10/10/2018 2303   UROBILINOGEN 4.0 (H) 12/05/2016 0921   NITRITE NEGATIVE 10/10/2018 2303   LEUKOCYTESUR NEGATIVE 10/10/2018 2303     Belenda Alviar M.D. [facility] 11/18/2018, 10:39 AM  Pager: [phone] Between 7am to 7pm - call Pager - [phone]  After 7pm go to [email or web address] - password [email or web address]  Call night coverage person covering after 7pm

## 2018-11-19 ENCOUNTER — Encounter (HOSPITAL_COMMUNITY)
Admission: EM | Disposition: A | Discharge status: Skilled Nursing Facility | Payer: Self-pay | Source: Home / Self Care | Attending: Internal Medicine

## 2018-11-19 ENCOUNTER — Inpatient Hospital Stay (HOSPITAL_COMMUNITY): Payer: Medicaid Other | Admitting: Certified Registered"

## 2018-11-19 ENCOUNTER — Encounter (HOSPITAL_COMMUNITY): Payer: Self-pay | Admitting: Certified Registered"

## 2018-11-19 DIAGNOSIS — I96 Gangrene, not elsewhere classified: Secondary | ICD-10-CM

## 2018-11-19 HISTORY — PX: AMPUTATION: SHX166

## 2018-11-19 LAB — CBC
HCT: 33.1 % — ABNORMAL LOW (ref 39.0–52.0)
HCT: 32.9 % — ABNORMAL LOW (ref 39.0–52.0)
HEMOGLOBIN: 10.9 g/dL — AB (ref 13.0–17.0)
Hemoglobin: 10.7 g/dL — ABNORMAL LOW (ref 13.0–17.0)
MCH: 29.4 pg (ref 26.0–34.0)
MCH: 28.9 pg (ref 26.0–34.0)
MCHC: 32.9 g/dL (ref 30.0–36.0)
MCHC: 32.5 g/dL (ref 30.0–36.0)
MCV: 89.2 fL (ref 80.0–100.0)
MCV: 88.9 fL (ref 80.0–100.0)
NRBC: 0 % (ref 0.0–0.2)
Platelets: 365 10*3/uL (ref 150–400)
Platelets: 359 10*3/uL (ref 150–400)
RBC: 3.71 MIL/uL — ABNORMAL LOW (ref 4.22–5.81)
RBC: 3.7 MIL/uL — AB (ref 4.22–5.81)
RDW: 15.8 % — ABNORMAL HIGH (ref 11.5–15.5)
RDW: 15.9 % — ABNORMAL HIGH (ref 11.5–15.5)
WBC: 7.6 10*3/uL (ref 4.0–10.5)
WBC: 7.2 10*3/uL (ref 4.0–10.5)
nRBC: 0 % (ref 0.0–0.2)

## 2018-11-19 LAB — GLUCOSE, CAPILLARY
GLUCOSE-CAPILLARY: 150 mg/dL — AB (ref 70–99)
Glucose-Capillary: 132 mg/dL — ABNORMAL HIGH (ref 70–99)
Glucose-Capillary: 159 mg/dL — ABNORMAL HIGH (ref 70–99)
Glucose-Capillary: 82 mg/dL (ref 70–99)
Glucose-Capillary: 283 mg/dL — ABNORMAL HIGH (ref 70–99)

## 2018-11-19 LAB — CREATININE, SERUM
Creatinine, Ser: 0.49 mg/dL — ABNORMAL LOW (ref 0.61–1.24)
GFR calc Af Amer: >60 mL/min (ref >60)
GFR calc non Af Amer: >60 mL/min (ref >60)

## 2018-11-19 LAB — HEPARIN LEVEL (UNFRACTIONATED)
Heparin Unfractionated: 0.17 IU/mL — ABNORMAL LOW (ref 0.30–0.70)
Heparin Unfractionated: 0.34 IU/mL (ref 0.30–0.70)

## 2018-11-19 SURGERY — AMPUTATION BELOW KNEE
Anesthesia: General | Site: Leg Lower | Laterality: Right

## 2018-11-19 MED ORDER — FENTANYL CITRATE (PF) 100 MCG/2ML IJ SOLN
INTRAMUSCULAR | Status: AC
  Filled 2018-11-19: qty 2

## 2018-11-19 MED ORDER — FENTANYL CITRATE (PF) 250 MCG/5ML IJ SOLN
INTRAMUSCULAR | Status: DC | PRN
  Administered 2018-11-19: 100 ug via INTRAVENOUS
  Administered 2018-11-19 (×3): 25 ug via INTRAVENOUS

## 2018-11-19 MED ORDER — LIDOCAINE 2% (20 MG/ML) 5 ML SYRINGE
INTRAMUSCULAR | Status: DC | PRN
  Administered 2018-11-19: 100 mg via INTRAVENOUS

## 2018-11-19 MED ORDER — BACITRACIN ZINC 500 UNIT/GM EX OINT
TOPICAL_OINTMENT | CUTANEOUS | Status: AC
  Filled 2018-11-19: qty 28.35

## 2018-11-19 MED ORDER — MIDAZOLAM HCL 2 MG/2ML IJ SOLN
INTRAMUSCULAR | Status: AC
  Filled 2018-11-19: qty 2

## 2018-11-19 MED ORDER — HYDROMORPHONE HCL 1 MG/ML IJ SOLN
INTRAMUSCULAR | Status: AC
  Filled 2018-11-19: qty 1

## 2018-11-19 MED ORDER — ONDANSETRON HCL 4 MG/2ML IJ SOLN
INTRAMUSCULAR | Status: DC | PRN
  Administered 2018-11-19: 4 mg via INTRAVENOUS

## 2018-11-19 MED ORDER — PROPOFOL 10 MG/ML IV BOLUS
INTRAVENOUS | Status: DC | PRN
  Administered 2018-11-19: 200 mg via INTRAVENOUS

## 2018-11-19 MED ORDER — MORPHINE SULFATE (PF) 2 MG/ML IV SOLN
2.0000 mg | INTRAVENOUS | Status: DC | PRN
  Administered 2018-11-19: 2 mg via INTRAVENOUS
  Administered 2018-11-20: 4 mg via INTRAVENOUS
  Filled 2018-11-19: qty 2
  Filled 2018-11-19: qty 1

## 2018-11-19 MED ORDER — HYDROMORPHONE HCL 1 MG/ML IJ SOLN
0.5000 mg | INTRAMUSCULAR | Status: DC | PRN
  Administered 2018-11-19 (×2): 0.5 mg via INTRAVENOUS

## 2018-11-19 MED ORDER — PHENYLEPHRINE 40 MCG/ML (10ML) SYRINGE FOR IV PUSH (FOR BLOOD PRESSURE SUPPORT)
PREFILLED_SYRINGE | INTRAVENOUS | Status: AC
  Filled 2018-11-19: qty 10

## 2018-11-19 MED ORDER — ENOXAPARIN SODIUM 40 MG/0.4ML ~~LOC~~ SOLN: 40.0000 mg | SUBCUTANEOUS | Status: DC

## 2018-11-19 MED ORDER — MEPERIDINE HCL 50 MG/ML IJ SOLN: 6.2500 mg | INTRAMUSCULAR | Status: DC | PRN

## 2018-11-19 MED ORDER — PHENYLEPHRINE 40 MCG/ML (10ML) SYRINGE FOR IV PUSH (FOR BLOOD PRESSURE SUPPORT)
PREFILLED_SYRINGE | INTRAVENOUS | Status: DC | PRN
  Administered 2018-11-19: 80 ug via INTRAVENOUS
  Administered 2018-11-19: 120 ug via INTRAVENOUS
  Administered 2018-11-19: 80 ug via INTRAVENOUS
  Administered 2018-11-19: 120 ug via INTRAVENOUS
  Administered 2018-11-19: 160 ug via INTRAVENOUS

## 2018-11-19 MED ORDER — SODIUM CHLORIDE 0.9% FLUSH
10.0000 mL | Freq: Two times a day (BID) | INTRAVENOUS | Status: DC
  Administered 2018-11-19 – 2018-12-12 (×31): 10 mL

## 2018-11-19 MED ORDER — ONDANSETRON HCL 4 MG/2ML IJ SOLN
INTRAMUSCULAR | Status: AC
  Filled 2018-11-19: qty 2

## 2018-11-19 MED ORDER — GUAIFENESIN-DM 100-10 MG/5ML PO SYRP
15.0000 mL | ORAL_SOLUTION | ORAL | Status: DC | PRN
  Filled 2018-11-19: qty 15

## 2018-11-19 MED ORDER — MIDAZOLAM HCL 2 MG/2ML IJ SOLN
INTRAMUSCULAR | Status: DC | PRN
  Administered 2018-11-19: 2 mg via INTRAVENOUS

## 2018-11-19 MED ORDER — MAGNESIUM SULFATE 2 GM/50ML IV SOLN
2.0000 g | Freq: Once | INTRAVENOUS | Status: DC | PRN
  Filled 2018-11-19: qty 50

## 2018-11-19 MED ORDER — LIDOCAINE 2% (20 MG/ML) 5 ML SYRINGE
INTRAMUSCULAR | Status: AC
  Filled 2018-11-19: qty 5

## 2018-11-19 MED ORDER — SODIUM CHLORIDE 0.9 % IV SOLN
INTRAVENOUS | Status: DC | PRN
  Administered 2018-11-19: 50 ug/min via INTRAVENOUS
  Administered 2018-11-19: 25 ug/min via INTRAVENOUS

## 2018-11-19 MED ORDER — EPHEDRINE SULFATE-NACL 50-0.9 MG/10ML-% IV SOSY
PREFILLED_SYRINGE | INTRAVENOUS | Status: DC | PRN
  Administered 2018-11-19: 15 mg via INTRAVENOUS
  Administered 2018-11-19: 10 mg via INTRAVENOUS

## 2018-11-19 MED ORDER — KCL IN DEXTROSE-NACL 20-5-0.45 MEQ/L-%-% IV SOLN
INTRAVENOUS | Status: DC
  Administered 2018-11-19 – 2018-11-20 (×2): via INTRAVENOUS
  Filled 2018-11-19 (×2): qty 1000

## 2018-11-19 MED ORDER — METOPROLOL TARTRATE 5 MG/5ML IV SOLN
2.0000 mg | INTRAVENOUS | Status: DC | PRN
  Administered 2018-11-21: 2 mg via INTRAVENOUS

## 2018-11-19 MED ORDER — METOCLOPRAMIDE HCL 5 MG/ML IJ SOLN: 10.0000 mg | Freq: Once | INTRAMUSCULAR | Status: DC | PRN

## 2018-11-19 MED ORDER — OXYCODONE-ACETAMINOPHEN 5-325 MG PO TABS
1.0000 | ORAL_TABLET | ORAL | Status: DC | PRN
  Administered 2018-11-19: 2 via ORAL
  Administered 2018-11-19: 1 via ORAL
  Administered 2018-11-20 – 2018-11-23 (×13): 2 via ORAL
  Administered 2018-11-24 – 2018-11-25 (×4): 1 via ORAL
  Administered 2018-11-25 – 2018-12-06 (×29): 2 via ORAL
  Administered 2018-12-06: 1 via ORAL
  Administered 2018-12-07 – 2018-12-13 (×16): 2 via ORAL
  Filled 2018-11-19 (×4): qty 2
  Filled 2018-11-19: qty 1
  Filled 2018-11-19 (×10): qty 2
  Filled 2018-11-19: qty 1
  Filled 2018-11-19 (×28): qty 2
  Filled 2018-11-19 (×2): qty 1
  Filled 2018-11-19 (×14): qty 2
  Filled 2018-11-19 (×2): qty 1
  Filled 2018-11-19 (×7): qty 2

## 2018-11-19 MED ORDER — ALUM & MAG HYDROXIDE-SIMETH 200-200-20 MG/5ML PO SUSP
15.0000 mL | ORAL | Status: DC | PRN
  Administered 2018-12-04 – 2018-12-15 (×12): 30 mL via ORAL
  Filled 2018-11-19 (×14): qty 30

## 2018-11-19 MED ORDER — FENTANYL CITRATE (PF) 250 MCG/5ML IJ SOLN
INTRAMUSCULAR | Status: AC
  Filled 2018-11-19: qty 5

## 2018-11-19 MED ORDER — BACITRACIN ZINC 500 UNIT/GM EX OINT
TOPICAL_OINTMENT | CUTANEOUS | Status: DC | PRN
  Administered 2018-11-19: 1 via TOPICAL

## 2018-11-19 MED ORDER — LACTATED RINGERS IV SOLN
INTRAVENOUS | Status: DC | PRN
  Administered 2018-11-19: 07:00:00 via INTRAVENOUS

## 2018-11-19 MED ORDER — ACETAMINOPHEN 650 MG RE SUPP: 325.0000 mg | RECTAL | Status: DC | PRN

## 2018-11-19 MED ORDER — PROPOFOL 10 MG/ML IV BOLUS
INTRAVENOUS | Status: AC
  Filled 2018-11-19: qty 20

## 2018-11-19 MED ORDER — DOCUSATE SODIUM 100 MG PO CAPS
100.0000 mg | ORAL_CAPSULE | Freq: Every day | ORAL | Status: DC
  Administered 2018-11-21 – 2018-12-13 (×20): 100 mg via ORAL
  Filled 2018-11-19 (×26): qty 1

## 2018-11-19 MED ORDER — 0.9 % SODIUM CHLORIDE (POUR BTL) OPTIME
TOPICAL | Status: DC | PRN
  Administered 2018-11-19: 1000 mL

## 2018-11-19 MED ORDER — POTASSIUM CHLORIDE CRYS ER 20 MEQ PO TBCR
20.0000 meq | EXTENDED_RELEASE_TABLET | Freq: Once | ORAL | Status: AC | PRN
  Administered 2018-11-25: 40 meq via ORAL
  Filled 2018-11-19: qty 2

## 2018-11-19 MED ORDER — HYDROMORPHONE HCL 1 MG/ML IJ SOLN
0.5000 mg | INTRAMUSCULAR | Status: AC | PRN
  Administered 2018-11-19 (×4): 0.5 mg via INTRAVENOUS

## 2018-11-19 MED ORDER — ACETAMINOPHEN 325 MG PO TABS: 325.0000 mg | ORAL_TABLET | ORAL | Status: DC | PRN

## 2018-11-19 MED ORDER — HYDRALAZINE HCL 20 MG/ML IJ SOLN
5.0000 mg | INTRAMUSCULAR | Status: DC | PRN
  Administered 2018-12-03: 5 mg via INTRAVENOUS
  Filled 2018-11-19 (×2): qty 1

## 2018-11-19 MED ORDER — LABETALOL HCL 5 MG/ML IV SOLN
10.0000 mg | INTRAVENOUS | Status: DC | PRN
  Filled 2018-11-19: qty 4

## 2018-11-19 MED ORDER — LACTATED RINGERS IV SOLN: INTRAVENOUS | Status: DC

## 2018-11-19 MED ORDER — FENTANYL CITRATE (PF) 100 MCG/2ML IJ SOLN
25.0000 ug | INTRAMUSCULAR | Status: DC | PRN
  Administered 2018-11-19 (×3): 50 ug via INTRAVENOUS

## 2018-11-19 MED ORDER — SODIUM CHLORIDE 0.9% FLUSH
10.0000 mL | INTRAVENOUS | Status: DC | PRN
  Administered 2018-11-23 – 2018-11-24 (×2): 10 mL
  Administered 2018-12-01: 20 mL
  Administered 2018-12-02 – 2018-12-15 (×3): 10 mL
  Filled 2018-11-19 (×6): qty 40

## 2018-11-19 SURGICAL SUPPLY — 57 items
BANDAGE ELASTIC 4 VELCRO ST LF (GAUZE/BANDAGES/DRESSINGS) ×2 IMPLANT
BANDAGE ESMARK 6X9 LF (GAUZE/BANDAGES/DRESSINGS) ×1 IMPLANT
BLADE SAW RECIP 87.9 MT (BLADE) ×3 IMPLANT
BLADE SURG 10 STRL SS (BLADE) ×2 IMPLANT
BNDG COHESIVE 6X5 TAN STRL LF (GAUZE/BANDAGES/DRESSINGS) ×3 IMPLANT
BNDG ESMARK 6X9 LF (GAUZE/BANDAGES/DRESSINGS) ×3
BNDG GAUZE ELAST 4 BULKY (GAUZE/BANDAGES/DRESSINGS) ×4 IMPLANT
CANISTER SUCT 3000ML PPV (MISCELLANEOUS) ×6 IMPLANT
CLIP VESOCCLUDE MED 6/CT (CLIP) ×2 IMPLANT
COVER SURGICAL LIGHT HANDLE (MISCELLANEOUS) ×3 IMPLANT
COVER WAND RF STERILE (DRAPES) ×3 IMPLANT
CUFF TOURNIQUET SINGLE 24IN (TOURNIQUET CUFF) IMPLANT
CUFF TOURNIQUET SINGLE 34IN LL (TOURNIQUET CUFF) ×2 IMPLANT
CUFF TOURNIQUET SINGLE 44IN (TOURNIQUET CUFF) IMPLANT
DRAIN CHANNEL 15F RND FF W/TCR (WOUND CARE) ×2 IMPLANT
DRAIN CHANNEL 19F RND (DRAIN) IMPLANT
DRAPE HALF SHEET 40X57 (DRAPES) ×3 IMPLANT
DRAPE ORTHO SPLIT 77X108 STRL (DRAPES) ×4
DRAPE SURG ORHT 6 SPLT 77X108 (DRAPES) ×2 IMPLANT
DRAPE U-SHAPE 47X51 STRL (DRAPES) ×3 IMPLANT
DRSG ADAPTIC 3X8 NADH LF (GAUZE/BANDAGES/DRESSINGS) ×3 IMPLANT
ELECT REM PT RETURN 9FT ADLT (ELECTROSURGICAL) ×3
ELECTRODE REM PT RTRN 9FT ADLT (ELECTROSURGICAL) ×1 IMPLANT
EVACUATOR SILICONE 100CC (DRAIN) ×2 IMPLANT
GAUZE SPONGE 4X4 12PLY STRL (GAUZE/BANDAGES/DRESSINGS) ×4 IMPLANT
GAUZE SPONGE 4X4 12PLY STRL LF (GAUZE/BANDAGES/DRESSINGS) ×2 IMPLANT
GLOVE BIO SURGEON STRL SZ 6.5 (GLOVE) ×1 IMPLANT
GLOVE BIO SURGEON STRL SZ7 (GLOVE) ×2 IMPLANT
GLOVE BIO SURGEON STRL SZ7.5 (GLOVE) ×3 IMPLANT
GLOVE BIO SURGEONS STRL SZ 6.5 (GLOVE) ×1
GLOVE BIOGEL PI IND STRL 6.5 (GLOVE) IMPLANT
GLOVE BIOGEL PI IND STRL 7.0 (GLOVE) IMPLANT
GLOVE BIOGEL PI IND STRL 8 (GLOVE) ×1 IMPLANT
GLOVE BIOGEL PI INDICATOR 6.5 (GLOVE) ×2
GLOVE BIOGEL PI INDICATOR 7.0 (GLOVE) ×2
GLOVE BIOGEL PI INDICATOR 8 (GLOVE) ×2
GOWN STRL REUS W/ TWL LRG LVL3 (GOWN DISPOSABLE) ×3 IMPLANT
GOWN STRL REUS W/TWL LRG LVL3 (GOWN DISPOSABLE) ×6
KIT BASIN OR (CUSTOM PROCEDURE TRAY) ×3 IMPLANT
KIT TURNOVER KIT B (KITS) ×3 IMPLANT
NS IRRIG 1000ML POUR BTL (IV SOLUTION) ×3 IMPLANT
PACK GENERAL/GYN (CUSTOM PROCEDURE TRAY) ×3 IMPLANT
PAD ARMBOARD 7.5X6 YLW CONV (MISCELLANEOUS) ×6 IMPLANT
RASP HELIOCORDIAL MED (MISCELLANEOUS) IMPLANT
STAPLER VISISTAT (STAPLE) ×3 IMPLANT
STOCKINETTE IMPERVIOUS LG (DRAPES) ×3 IMPLANT
SUT ETHILON 3 0 PS 1 (SUTURE) ×2 IMPLANT
SUT SILK 0 TIES 10X30 (SUTURE) ×3 IMPLANT
SUT SILK 2 0 (SUTURE) ×2
SUT SILK 2 0 SH CR/8 (SUTURE) ×3 IMPLANT
SUT SILK 2-0 18XBRD TIE 12 (SUTURE) ×1 IMPLANT
SUT SILK 3 0 (SUTURE) ×2
SUT SILK 3-0 18XBRD TIE 12 (SUTURE) ×1 IMPLANT
SUT VIC AB 2-0 CT1 18 (SUTURE) ×5 IMPLANT
TOWEL GREEN STERILE (TOWEL DISPOSABLE) ×6 IMPLANT
UNDERPAD 30X30 (UNDERPADS AND DIAPERS) ×3 IMPLANT
WATER STERILE IRR 1000ML POUR (IV SOLUTION) ×3 IMPLANT

## 2018-11-19 NOTE — Anesthesia Postprocedure Evaluation (Signed)
Anesthesia Post Note  Patient: Ryan Medina  Procedure(s) Performed: AMPUTATION BELOW KNEE (Right Leg Lower)     Patient location during evaluation: PACU Anesthesia Type: General Level of consciousness: awake and alert Pain management: pain level controlled Vital Signs Assessment: post-procedure vital signs reviewed and stable Respiratory status: spontaneous breathing, nonlabored ventilation, respiratory function stable and patient connected to nasal cannula oxygen Cardiovascular status: blood pressure returned to baseline and stable Postop Assessment: no apparent nausea or vomiting Anesthetic complications: no    Last Vitals:  Vitals:   11/19/18 1050 11/19/18 1157  BP: 127/84 (!) 122/92  Pulse: 94 88  Resp: 15 17  Temp: (!) 36.3 C 36.8 C  SpO2: 99% 97%    Last Pain:  Vitals:   11/19/18 1157  TempSrc: Oral  PainSc:                  Montez Hageman

## 2018-11-19 NOTE — Op Note (Signed)
    NAME: Ryan Medina    MRN: [id number] DOB: 1961-02-01    DATE OF OPERATION: 11/19/2018  PREOP DIAGNOSIS:    Gangrenous right foot  POSTOP DIAGNOSIS:    Same  PROCEDURE:    Right below the knee amputation  SURGEON: Judeth Cornfield. Scot Dock, MD, FACS  ASSIST: Rene Kocher, NP  ANESTHESIA: General  EBL: Minimal  INDICATIONS:    Ryan Medina is a 58 y.o. male who had a gangrenous right foot that was not salvageable.  Right below the knee amputation was recommended.  FINDINGS:   Good bleeding at the site of the amputation.  TECHNIQUE:   The patient was taken to the operating room and received a general anesthetic.  The right lower extremity was prepped and draped in usual sterile fashion.  The circumference of the limb was measured 10 cm distal to the tibial tuberosity and two thirds at this distance was used to mark an anterior skin flap.  A long posterior flap of equal length was marked.  Tourniquet had been placed on the thigh.  The leg was exsanguinated with an Esmarch bandage and the tourniquet inflated to 300 mmHg.  Under tourniquet control, the incision was carried down through the skin, subcutaneous tissue, fascia, and muscle to the tibia and fibula which were dissected free circumferentially.  The periosteum was elevated.  The bone was divided proximal to the level of skin division.  The anterior aspect of the tibia was beveled.  The arteries were individually suture ligated with 2-0 silk ties as were the veins.  The tourniquet was then released.  Additional hemostasis was obtained using electrocautery.  The fascial layer was closed over a drain with interrupted 2-0 Vicryl's.  The skin was closed with staples.  Sterile dressing was applied.  The patient tolerated the procedure well and was transferred to the recovery room in stable condition.  All needle and sponge counts were correct.  [name], MD, FACS [facility]  DATE OF  DICTATION:   11/19/2018

## 2018-11-19 NOTE — Anesthesia Procedure Notes (Signed)
Procedure Name: LMA Insertion Date/Time: 11/19/2018 7:43 AM Performed by: Barrington Ellison, CRNA Pre-anesthesia Checklist: Patient identified, Emergency Drugs available, Suction available and Patient being monitored Patient Re-evaluated:Patient Re-evaluated prior to induction Oxygen Delivery Method: Circle System Utilized Preoxygenation: Pre-oxygenation with 100% oxygen Induction Type: IV induction Ventilation: Mask ventilation without difficulty LMA: LMA inserted LMA Size: 4.0 Number of attempts: 1 Placement Confirmation: positive ETCO2 Tube secured with: Tape Dental Injury: Teeth and Oropharynx as per pre-operative assessment

## 2018-11-19 NOTE — Progress Notes (Signed)
[place] for Heparin  Indication: PE, DVT  Allergies  Allergen Reactions  . Lisinopril Anaphylaxis   Patient Measurements: Height: 5\' 11"  (180.3 cm) Weight: 123 lb 14.4 oz (56.2 kg) IBW/kg (Calculated) : 75.3 Heparin Dosing Weight: 66.7  Vital Signs: Temp: 98.5 F (36.9 C) (02/28 2007) Temp Source: Oral (02/28 2007) BP: 123/99 (02/28 2007) Pulse Rate: 91 (02/28 2007)  Labs: Recent Labs    11/17/18 0408 11/18/18 0318 11/19/18 0401 11/19/18 1148 11/19/18 1946  HGB 10.9* 10.0* 10.9* 10.7*  --   HCT 35.8* 32.2* 33.1* 32.9*  --   PLT 371 341 365 359  --   HEPARINUNFRC 0.66 0.58 0.17*  --  0.34  CREATININE 0.57*  --   --  0.49*  --    Estimated Creatinine Clearance: 81 mL/min (A) (by C-G formula based on SCr of 0.49 mg/dL (L)).  Assessment: 58 yo male acute PE diagnosed on 2/5. Has been using Lovenox and heparin. Currently on heparin gtt.   Heparin resumed after surgery Heparin level therapeutic   Goal of Therapy:  Monitor platelets by anticoagulation protocol: Yes Heparin level 0.3-0.5 units/ml   Plan:  Continue heparin gtt at 1,300 units/hr Monitor daily heparin level, CBC, s/s of bleed  Thank you Anette Guarneri, PharmD [phone] 11/19/2018 8:39 PM

## 2018-11-19 NOTE — Progress Notes (Signed)
Heparin stopped.

## 2018-11-19 NOTE — Progress Notes (Signed)
Patient is scheduled for surgery in morning and is currently receiving heparin drip. There are no orders to stop the infusion. I have also not seen anything in the notes about stopping before surgery. Called on call MD, Pharmacy & OR & received mixed answers. Heparin is still running.

## 2018-11-19 NOTE — Anesthesia Preprocedure Evaluation (Signed)
Anesthesia Evaluation  Patient identified by MRN, date of birth, ID band Patient awake    Reviewed: Allergy & Precautions, NPO status , Patient's Chart, lab work & pertinent test results  Airway Mallampati: II  TM Distance: >3 FB Neck ROM: Full    Dental no notable dental hx.    Pulmonary Current Smoker,    Pulmonary exam normal breath sounds clear to auscultation       Cardiovascular hypertension, Pt. on medications + Peripheral Vascular Disease and +CHF  Normal cardiovascular exam Rhythm:Regular Rate:Normal     Neuro/Psych negative neurological ROS  negative psych ROS   GI/Hepatic negative GI ROS, Neg liver ROS,   Endo/Other  diabetes, Poorly Controlled, Insulin Dependent  Renal/GU negative Renal ROS  negative genitourinary   Musculoskeletal negative musculoskeletal ROS (+)   Abdominal   Peds negative pediatric ROS (+)  Hematology negative hematology ROS (+)   Anesthesia Other Findings   Reproductive/Obstetrics negative OB ROS                             Anesthesia Physical Anesthesia Plan  ASA: II  Anesthesia Plan: General   Post-op Pain Management:    Induction: Intravenous  PONV Risk Score and Plan: 1 and Ondansetron and Treatment may vary due to age or medical condition  Airway Management Planned: LMA and Oral ETT  Additional Equipment:   Intra-op Plan:   Post-operative Plan: Extubation in OR  Informed Consent: I have reviewed the patients History and Physical, chart, labs and discussed the procedure including the risks, benefits and alternatives for the proposed anesthesia with the patient or authorized representative who has indicated his/her understanding and acceptance.     Dental advisory given  Plan Discussed with: CRNA  Anesthesia Plan Comments:         Anesthesia Quick Evaluation

## 2018-11-19 NOTE — Progress Notes (Signed)
PROGRESS NOTE    Ryan Medina  KNL:[id number] DOB: [birth year] DOA: 09/03/2018 PCP: Patient, No Pcp Per      Brief Narrative:  Ryan Medina is a 58 y.o. M with DM, HTN, sCHF EF 45% and polysubstance abuse/homelessness who presented initially with left leg pain on 12/13, found to have acute thromboembolic ischemic left leg.  Taken emergently for embolectomy.  Post-op course complicated.  Of primary importance, found to have inoperable pancreatic cancer.  In brief, right leg progressively worse, ultimately gangrenous requiring BKA; difficult balance between -- recurrent thromboembolic events due to malignancy vs. severe and recurrent GIB from duodenal ulcer (erosion from malignancy), all punctuated by pneumonia sepsis, now resolved.   Please see separate summary by me from 2/28.      Assessment & Plan:  Right leg gangrene S/p embolectomy x2 S/p BKA 2/28 -PT eval  Gastric outlet obstruction Severe protein calorie malnutrition S/p gastrojejunostomy TPN weaned off.  Tolerating diet well now. -Consult dietitian -Continue Ensure  Acute blood loss anemia on anemia of chronic disease Hematochezia Malignant duodenal ulcer Blood loss initially from malignant duodenal ulcer, since coiled.  Now presume this is from bleeding near/at gastrojejunostomy area.   Hgb stable, no active bleeding today.  31 units PRBCs to date, most recently 2/24 -Trend Hgb three times weekly -Continue PPI and sucralfate  Pacreatic adenoCA Locally advanced, inoperable.  XRT for treatment of bleeding, complete.  Chemotherapy pending, palliative intent. -Consult Oncology, appreciate cares  Candida esophagitis Therapy completed.  Asymptomatic now.  Pulmonary embolism -Continue heparin gtt  Chronic systolic and diastolic CHF Appears euvolemic.  EF 45% this hospitalization  Diabetes HgbA1c 18.2% this hospitalization.  Glucoses quite good in last week. -Continue SSI with meals  Sepsis from HCAP,  Pseudomonas UTI Treated with Cefepime 1/11-1/16, fever resolved.  Stage 2 pressure ulcer coccyx, not POA          MDM and disposition: The below labs and imaging reports were reviewed and summarized above.  Extensive medical records reviewed and summarized in separate documentation.  Medication management as above including anticoagulation.  The patient was admitted with ischemic leg, found to have pancreatic cancer and hypercoagulability from that causing multiple complications.  He has just undergone BKA today.  Will re-evaluate by PT today, work towards a safe discharge plan.          DVT prophylaxis: N/A on heparin Code Status: FULL Family Communication: None present     Procedures:   See separate documentation      Subjective: No complaints.  Appetite good.  No fever.  Stump sore post-op but better.  No confusion, cough, dyspnea, chest pain.  No rectal bleeding.  Objective: Vitals:   11/19/18 1020 11/19/18 1035 11/19/18 1050 11/19/18 1157  BP: (!) 123/93 (!) 133/95 127/84 (!) 122/92  Pulse: (!) 104 (!) 101 94 88  Resp: 15 19 15 17   Temp:   (!) 97.3 F (36.3 C) 98.2 F (36.8 C)  TempSrc:    Oral  SpO2: 98% 97% 99% 97%  Weight:      Height:        Intake/Output Summary (Last 24 hours) at 11/19/2018 1253 Last data filed at 11/19/2018 1020 Gross per 24 hour  Intake 1326 ml  Output 508 ml  Net 818 ml   Filed Weights   11/02/18 1806 11/04/18 0831 11/12/18 0742  Weight: 56.2 kg 56.2 kg 56.2 kg    Examination: General appearance: frail adult male, alert and in no acute distress.  Interactive,  reading on his phone about making a will and testament HEENT: Anicteric, conjunctiva pink, lids and lashes normal. No nasal deformity, discharge, epistaxis.  Lips moist, dentition normal, OP moist, no oral elsions, hearing normal.   Skin: Warm and dry.  No suspicious rashes or lesions. Cardiac: Tachycardic, regular, nl S1-S2, no murmurs appreciated.  Capillary  refill is brisk.  JVP normal.  No LE edema.  Radia pulses 2+ and symmetric. Respiratory: Normal respiratory rate and rhythm.  CTAB without rales or wheezes. Abdomen: Abdomen soft.  No TTP. No ascites, distension, hepatosplenomegaly.   MSK: No deformities or effusions.  New right BKA with JP drain.  Diffuse severe loss of subcutaneous muscle mass and fat, temporal wasting. Neuro: Awake and alert.  EOMI, moves all extremities. Speech fluent.    Psych: Sensorium intact and responding to questions, attention normal. Affect normal.  Judgment and insight appear normal.    Data Reviewed: I have personally reviewed following labs and imaging studies:  CBC: Recent Labs  Lab 11/16/18 0109 11/17/18 0408 11/18/18 0318 11/19/18 0401 11/19/18 1148  WBC 8.8 7.1 7.0 7.6 7.2  HGB 10.3* 10.9* 10.0* 10.9* 10.7*  HCT 31.9* 35.8* 32.2* 33.1* 32.9*  MCV 89.6 94.0 93.6 89.2 88.9  PLT 363 371 341 365 151   Basic Metabolic Panel: Recent Labs  Lab 11/13/18 0800 11/14/18 0511 11/15/18 0500 11/15/18 0700 11/16/18 0109 11/17/18 0408  NA 134* 134* PATIENT IDENTIFICATION ERROR. PLEASE DISREGARD RESULTS. ACCOUNT WILL BE CREDITED. 135 136 136  K 4.3 4.6 PATIENT IDENTIFICATION ERROR. PLEASE DISREGARD RESULTS. ACCOUNT WILL BE CREDITED. 4.2 4.0 3.8  CL 103 101 PATIENT IDENTIFICATION ERROR. PLEASE DISREGARD RESULTS. ACCOUNT WILL BE CREDITED. 101 101 103  CO2 28 24 PATIENT IDENTIFICATION ERROR. PLEASE DISREGARD RESULTS. ACCOUNT WILL BE CREDITED. 25 27 26   GLUCOSE 75 132* PATIENT IDENTIFICATION ERROR. PLEASE DISREGARD RESULTS. ACCOUNT WILL BE CREDITED. 227* 154* 254*  BUN 16 13 PATIENT IDENTIFICATION ERROR. PLEASE DISREGARD RESULTS. ACCOUNT WILL BE CREDITED. 12 11 16   CREATININE 0.38* 0.42* PATIENT IDENTIFICATION ERROR. PLEASE DISREGARD RESULTS. ACCOUNT WILL BE CREDITED. 0.48* 0.48* 0.57*  CALCIUM 8.0* 8.2* PATIENT IDENTIFICATION ERROR. PLEASE DISREGARD RESULTS. ACCOUNT WILL BE CREDITED. 8.1* 8.4* 8.4*  MG 1.8   --   --   --   --   --   PHOS 3.1  --   --   --   --   --    GFR: Estimated Creatinine Clearance: 81 mL/min (A) (by C-G formula based on SCr of 0.57 mg/dL (L)). Liver Function Tests: No results for input(s): AST, ALT, ALKPHOS, BILITOT, PROT, ALBUMIN in the last 168 hours. No results for input(s): LIPASE, AMYLASE in the last 168 hours. No results for input(s): AMMONIA in the last 168 hours. Coagulation Profile: No results for input(s): INR, PROTIME in the last 168 hours. Cardiac Enzymes: No results for input(s): CKTOTAL, CKMB, CKMBINDEX, TROPONINI in the last 168 hours. BNP (last 3 results) No results for input(s): PROBNP in the last 8760 hours. HbA1C: No results for input(s): HGBA1C in the last 72 hours. CBG: Recent Labs  Lab 11/18/18 1658 11/18/18 2142 11/19/18 0659 11/19/18 0907 11/19/18 1227  GLUCAP 194* 136* 132* 159* 150*   Lipid Profile: No results for input(s): CHOL, HDL, LDLCALC, TRIG, CHOLHDL, LDLDIRECT in the last 72 hours. Thyroid Function Tests: No results for input(s): TSH, T4TOTAL, FREET4, T3FREE, THYROIDAB in the last 72 hours. Anemia Panel: No results for input(s): VITAMINB12, FOLATE, FERRITIN, TIBC, IRON, RETICCTPCT in the last 72 hours. Urine analysis:  Component Value Date/Time   COLORURINE YELLOW 10/10/2018 2303   APPEARANCEUR CLEAR 10/10/2018 2303   LABSPEC 1.022 10/10/2018 2303   PHURINE 6.0 10/10/2018 2303   GLUCOSEU >=500 (A) 10/10/2018 2303   HGBUR NEGATIVE 10/10/2018 2303   BILIRUBINUR NEGATIVE 10/10/2018 2303   KETONESUR NEGATIVE 10/10/2018 2303   PROTEINUR NEGATIVE 10/10/2018 2303   UROBILINOGEN 4.0 (H) 12/05/2016 0921   NITRITE NEGATIVE 10/10/2018 2303   LEUKOCYTESUR NEGATIVE 10/10/2018 2303   Sepsis Labs: @LABRCNTIP (procalcitonin:4,lacticacidven:4)  ) Recent Results (from the past 240 hour(s))  Surgical pcr screen     Status: None   Collection Time: 11/18/18  2:58 PM  Result Value Ref Range Status   MRSA, PCR NEGATIVE NEGATIVE  Final   Staphylococcus aureus NEGATIVE NEGATIVE Final    Comment: (NOTE) The Xpert SA Assay (FDA approved for NASAL specimens in patients 54 years of age and older), is one component of a comprehensive surveillance program. It is not intended to diagnose infection nor to guide or monitor treatment. Performed at [facility], [place] [address] [place], [place] [address]          Radiology Studies: No results found.      Scheduled Meds: . acetaminophen  1,000 mg Oral Q8H  . [START ON 11/20/2018] docusate sodium  100 mg Oral Daily  . feeding supplement (ENSURE ENLIVE)  237 mL Oral TID BM  . fentaNYL      . fentaNYL      . HYDROmorphone      . HYDROmorphone      . HYDROmorphone      . insulin aspart  0-20 Units Subcutaneous TID WC  . insulin aspart  0-5 Units Subcutaneous QHS  . pantoprazole  40 mg Oral BID  . sucralfate  1 g Oral TID WC & HS   Continuous Infusions: . sodium chloride 10 mL/hr at 11/17/18 1500  . dextrose 5 % and 0.45 % NaCl with KCl 20 mEq/L    . heparin 1,300 Units/hr (11/19/18 1126)  . magnesium sulfate 1 - 4 g bolus IVPB       LOS: 77 days    Time spent: 35 minutes    Edwin Dada, MD [facility] 11/19/2018, 12:53 PM     Please page through [place]:  [email or web address] Password [email or web address] If 7PM-7AM, please contact night-coverage

## 2018-11-19 NOTE — Transfer of Care (Signed)
Immediate Anesthesia Transfer of Care Note  Patient: Ryan Medina  Procedure(s) Performed: AMPUTATION BELOW KNEE (Right Leg Lower)  Patient Location: PACU  Anesthesia Type:General  Level of Consciousness: lethargic  Airway & Oxygen Therapy: Patient Spontanous Breathing  Post-op Assessment: Report given to RN  Post vital signs: Reviewed and stable  Last Vitals:  Vitals Value Taken Time  BP 116/87 11/19/2018  9:06 AM  Temp    Pulse 99 11/19/2018  9:07 AM  Resp 18 11/19/2018  9:07 AM  SpO2 100 % 11/19/2018  9:07 AM  Vitals shown include unvalidated device data.  Last Pain:  Vitals:   11/19/18 0353  TempSrc:   PainSc: 9       Patients Stated Pain Goal: 0 (14/23/95 3202)  Complications: No apparent anesthesia complications

## 2018-11-19 NOTE — Plan of Care (Signed)
  Problem: Education: Goal: Knowledge of General Education information will improve Description Including pain rating scale, medication(s)/side effects and non-pharmacologic comfort measures  Outcome: Progressing   Problem: Skin Integrity: Goal: Risk for impaired skin integrity will decrease Outcome: Progressing   Problem: Tissue Perfusion: Goal: Adequacy of tissue perfusion will improve Outcome: Progressing

## 2018-11-19 NOTE — Progress Notes (Signed)
Ryan Medina is now over on 5 N.  It sounds like he is going to go to surgery this morning.  His heparin drip is off.  His labs look pretty good.  His hemoglobin is 10.8.  I just hate that needs to have the amputation but I think this is going to be the only way that he will have any meaningful long-term survival and quality of life.  Again, we cannot treat him with chemotherapy if he maintained his right foot.  He is eating.  He is having no nausea or vomiting.  He is having no bleeding.  There is no diarrhea.  There is no mouth sores.  He has had no fever.  On his physical exam, his temperature is 98.2.  Pulse 93.  Blood pressure 128/92.  His head neck exam shows no ocular or oral lesions.  He has no thrush.  His lungs are clear bilaterally.  Cardiac exam regular rate and rhythm with no murmurs, rubs or bruits.  Abdomen is soft.  He has the healing laparotomy scar.  He has decent bowel sounds.  There is no fluid wave.  There is no abdominal mass.  There is no liver or spleen tip.  Extremities shows the dressing on the right foot.  He has symmetric muscle atrophy in upper and lower extremities.  His past surgical scars on his legs are healed.  Neurological exam is nonfocal.  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  This is secondary to severe hypercoagulability from his malignancy.  He has metastatic pancreatic cancer.  Hopefully, he will have an uneventful postop course.  He will need to get back on heparin.  I probably would keep him on heparin for several days and then switch over to Lovenox.  I would not anticipate starting chemotherapy for another 10-14 days.  I appreciate everybody's help with Ryan Medina.  [name], MD  [facility] 6:9

## 2018-11-19 NOTE — Progress Notes (Signed)
[facility] for Heparin  Indication: PE, DVT  Allergies  Allergen Reactions  . Lisinopril Anaphylaxis   Patient Measurements: Height: 5\' 11"  (180.3 cm) Weight: 123 lb 14.4 oz (56.2 kg) IBW/kg (Calculated) : 75.3 Heparin Dosing Weight: 66.7  Vital Signs: Temp: 98.2 F (36.8 C) (02/28 1157) Temp Source: Oral (02/28 1157) BP: 122/92 (02/28 1157) Pulse Rate: 88 (02/28 1157)  Labs: Recent Labs    11/17/18 0408 11/18/18 0318 11/19/18 0401  HGB 10.9* 10.0* 10.9*  HCT 35.8* 32.2* 33.1*  PLT 371 341 365  HEPARINUNFRC 0.66 0.58 0.17*  CREATININE 0.57*  --   --    Estimated Creatinine Clearance: 81 mL/min (A) (by C-G formula based on SCr of 0.57 mg/dL (L)).  Assessment: 58 yo male acute PE diagnosed on 2/5. Has been using Lovenox and heparin. Currently on heparin gtt. Last heparin level was low this morning but gtt was off at 0300 for surgery today. Now restarted at 1130. Hgb 10.9, plts wnl.   Goal of Therapy:  Monitor platelets by anticoagulation protocol: Yes Heparin level 0.3-0.5 units/ml   Plan:  Continue heparin gtt at 1,300 units/hr Monitor daily heparin level, CBC, s/s of bleed Check heparin level tonight  Elenor Quinones, PharmD, [place], [facility] Clinical Pharmacist Phone number [phone] 11/19/2018 12:39 PM

## 2018-11-19 NOTE — Progress Notes (Signed)
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  I will see how patient does with therapies over the weekend and then check progress on Monday.  Patient did have a PMR consult done on 09/14/19.  Call me for questions.  [phone]

## 2018-11-19 NOTE — Progress Notes (Signed)
Interim Hospital Summary     Mr. [place] is a 58 y.o. M with DM, HTN, sCHF EF 45% and polysubstance abuse/homelessness who presented initially with right leg pain on 12/13, found to have acute thromboembolic ischemic right leg.  Taken emergently for embolectomy.      Major subsequent hospital events: 1. Right popliteal and tibial embolectomy at admission 12/13  2. Decompensation, intubation for duodenal hemorrhage on 12/17 with IR coiling  3. 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 Diagnosis of inoperable locally advanced pancreatic cancer incidentally on 12/21  5. Transferred to [facility] for XRT from 1/9-1/22; Port placed 1/21 but chemotherapy pending clinical stabilization  6. PE diagnosed 2/5 and restarted on anticoagulation  7. 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        Further timeline: 12/13 - Right popliteal and tibial embolectomy due to clinically ischemic right lower extremitiy  12/15 - Evacuation of hematoma and fasciotomy of right leg due to bleeding from right leg wound  12/17 - intubated, central line placed 12/17 - EGD showed actively bleeding duodenal ulcer 12/17 - IR performed mesenteric angiogram and embolization of bleeding gastroduodenal ulcer   12/20 - Repeat right popliteal and tibial embolectomy  12/21 - CTA abdomen shows pancreatic mass incidentally as well as mural thrombus in the dilated bilateral iliac arteries  12/26 - EUS for biopsy of pancreatic mass 12/26 - TEE normal EF, no valvular vegetations  1/9 - 1/22 - Treated with palliative XRT (primarily to stop GI bleeding) 1/21 - Port placed  2/5 - CT chest showed bilateral PE, given recurrent VTE, was restarted on anticoagulation with heparin gtt  2/7 - EGD repeat for follow up ulcer showed malignant pyloric stenosis, candidiasis  2/13 - Open 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 LA Grade D (one or more mucosal breaks involving at least 75% of esophageal circumference) esophagitis with bleeding was found in the middle and distal esophagus. This is severe and concerning for black esophagus which can be found in patients after severe hypotension. Clotted blood was found in the entire examined stomach. Lavage of the area was performed using copious amounts, resulting in incomplete clearance with fair visualization. A J-shaped deformity was found in the gastric body. There is no endoscopic evidence of ulceration in the entire examined stomach. One partially obstructing (about 60% obstructed) oozing cratered duodenal ulcer with an adherent clot (Forrest Class IIb) was found in the duodenal bulb. The lesion was at least 50 mm in largest dimension.  Impression:               - No gross lesions in proximal esophagus. LA Grade                            D esophagitis in middle and distal esophagus..                           - Clotted blood in the entire stomach. J-shaped  deformity in the gastric body.                           - One oozing duodenal ulcer with an adherent clot                            (Forrest Class IIb). There is no evidence of                            perforation. Attempts at removal of the clot were                            not successful. Without family around, and his                            clinical stability, decision made to not remove the                            clot because of concern for significant hemodynamic                            compromise. Hemospray was applied in effort to                            decrease the risk of  persistent bleeding while                            awaiting next steps in evaluation/therapy as                            outlined below.                           - No gross lesions in the second portion of the                            duodenum. Recommendation:           - The patient will be observed post-procedure,                            until all discharge criteria are met.                           - Return care to ICU.                           - Discussed case with ICU and with Interventional                            Radiology. Overall, high concern for risk of                            rebleeding, in setting of active oozing after just  movement of small amount of clot at the base - thus                            consistent with still active bleeding. This is too                            large of an area for other endoscopic therapies                            other than use of Hemospray which was used today.                            Next step in evaluation should be a mesenteric                            angiogram and likely GDA embolization to decrease                            risk as much as possible of severe hemorrhage again.                           - Do not place NGT for the next few days due to                            severe esophagitis.                           - Continue IV PPI drip.                           - Send H. pylori serology and treat if positive.                           - The findings and recommendations were discussed                            with the referring physician.         12/26 TEE EF 50-55% mil LVE Mild MR Normal AV No LAA thrombus Negative bubble study No SOE Normal RV  Mild TR No aortic debris     12/26 EUS Findings: 1. There was a large amount of old blood in the stomach. After noticing this, anesthesia team elected to protect his airway with ET intubation. 2. The  previously noted duodenal bulb ulcer was again located. It was still deeply cratered. The surrounding mucosa was quite friable and intermittently oozing. The mucosa was firm and concerning for malignancy and so I sampled it with forceps. 3. Reflux related esophagitis EUS findings: 1. Following EGD above, I proceeded wtih EUS evaluation. There was a large hypoechoic, heterogeneous mass involving the neck of the pancreas. This measured 3.6cm by 2.1cm. The mass causes main pancreatic duct obstruction and resultant dilation (to 29m). The mass encases the celiac trunk and abuts the SMA as well. I sampled the mass with three transgastric passes using a 25 gauge EUS FNA needle.  - 3.6cm  by 2.1cm mass in the neck of pancreas that surrounds the celiac trunk, obstructs the main pancreatic duct and appears to have also invaded the duodenal bulb created a large, malignant, friable ulcer. I biopsied the abnormal, firm mucosa surrounding the duodenal ulcer and sampled the pancreatic mass with transgastric EUS FNA. Preliminary cytology review from the pancreatic mass was positive for malignancy.    2/7 EGD report Findings: LA Grade C (one or more mucosal breaks continuous between tops of 2 or more mucosal folds, less than 75% circumference) esophagitis with no bleeding was found in the distal esophagus.  Esophagitis with no bleeding was found in the upper third of the esophagus. A large amount of food (residue) and liquid was found in the gastric fundus and in the gastric body. Not much could be suctioned out becayse scope suction kept getting clogged with debris. A malignant-appearing, intrinsic severe stenosis was found at the pylorus.It was not biopsied, as it is already known to be malignant. This was non-traversed, and there was barely any visible lumen. The scope was also looping due to gastric dilatation. An examination of the duodenum was not performed.     2/21 EGD report Findings: The  examined esophagus was normal. No esophageal varices. Evidence of a gastrojejunostomy was found in the gastric body. This was characterized by ulceration at the anastomosis. This area is difficult to examine given anatomy/location. There is a visible stable at the ulceration but no visible vessel was seen. There was no evidence of active or recent bleeding. A known malignant-appearing, intrinsic severe stenosis was found at the pylorus. This was not traversed. An examination of the duodenum was not performed. - Normal esophagus. No varices. - Small hiatal hernia. 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 - No specimens collected.

## 2018-11-19 NOTE — Interval H&P Note (Signed)
History and Physical Interval Note:  11/19/2018 7:20 AM  Ryan Medina  has presented today for surgery, with the diagnosis of gangrene right foot  The various methods of treatment have been discussed with the patient and family. After consideration of risks, benefits and other options for treatment, the patient has consented to  Procedure(s): AMPUTATION BELOW KNEE (Right) as a surgical intervention .  The patient's history has been reviewed, patient examined, no change in status, stable for surgery.  I have reviewed the patient's chart and labs.  Questions were answered to the patient's satisfaction.     [name]

## 2018-11-19 NOTE — Progress Notes (Addendum)
Called pharmacy back about stopping heparin. Advised to stop heparin at 3am.

## 2018-11-20 LAB — CBC
HCT: 30.9 % — ABNORMAL LOW (ref 39.0–52.0)
Hemoglobin: 10 g/dL — ABNORMAL LOW (ref 13.0–17.0)
MCH: 29 pg (ref 26.0–34.0)
MCHC: 32.4 g/dL (ref 30.0–36.0)
MCV: 89.6 fL (ref 80.0–100.0)
PLATELETS: 339 10*3/uL (ref 150–400)
RBC: 3.45 MIL/uL — ABNORMAL LOW (ref 4.22–5.81)
RDW: 15.8 % — ABNORMAL HIGH (ref 11.5–15.5)
WBC: 8.7 10*3/uL (ref 4.0–10.5)
nRBC: 0 % (ref 0.0–0.2)

## 2018-11-20 LAB — GLUCOSE, CAPILLARY
GLUCOSE-CAPILLARY: 151 mg/dL — AB (ref 70–99)
GLUCOSE-CAPILLARY: 123 mg/dL — AB (ref 70–99)
Glucose-Capillary: 150 mg/dL — ABNORMAL HIGH (ref 70–99)
Glucose-Capillary: 254 mg/dL — ABNORMAL HIGH (ref 70–99)
Glucose-Capillary: 35 mg/dL — CL (ref 70–99)
Glucose-Capillary: 88 mg/dL (ref 70–99)

## 2018-11-20 LAB — HEPARIN LEVEL (UNFRACTIONATED): Heparin Unfractionated: 0.37 IU/mL (ref 0.30–0.70)

## 2018-11-20 LAB — BASIC METABOLIC PANEL
Anion gap: 8 (ref 5–15)
BUN: 7 mg/dL (ref 6–20)
CO2: 27 mmol/L (ref 22–32)
Calcium: 8 mg/dL — ABNORMAL LOW (ref 8.9–10.3)
Chloride: 96 mmol/L — ABNORMAL LOW (ref 98–111)
Creatinine, Ser: 0.55 mg/dL — ABNORMAL LOW (ref 0.61–1.24)
GFR calc Af Amer: >60 mL/min (ref >60)
GFR calc non Af Amer: >60 mL/min (ref >60)
Glucose, Bld: 276 mg/dL — ABNORMAL HIGH (ref 70–99)
Potassium: 4 mmol/L (ref 3.5–5.1)
Sodium: 131 mmol/L — ABNORMAL LOW (ref 135–145)

## 2018-11-20 MED ORDER — INSULIN ASPART 100 UNIT/ML ~~LOC~~ SOLN
0.0000 [IU] | Freq: Three times a day (TID) | SUBCUTANEOUS | Status: DC
  Administered 2018-11-20: 2 [IU] via SUBCUTANEOUS
  Administered 2018-11-21: 1 [IU] via SUBCUTANEOUS
  Administered 2018-11-21: 2 [IU] via SUBCUTANEOUS
  Administered 2018-11-21 – 2018-11-22 (×2): 3 [IU] via SUBCUTANEOUS
  Administered 2018-11-22: 9 [IU] via SUBCUTANEOUS
  Administered 2018-11-23: 3 [IU] via SUBCUTANEOUS
  Administered 2018-11-23: 2 [IU] via SUBCUTANEOUS
  Administered 2018-11-23 – 2018-11-24 (×2): 5 [IU] via SUBCUTANEOUS
  Administered 2018-11-24: 2 [IU] via SUBCUTANEOUS
  Administered 2018-11-25: 5 [IU] via SUBCUTANEOUS
  Administered 2018-11-25: 3 [IU] via SUBCUTANEOUS
  Administered 2018-11-25: 2 [IU] via SUBCUTANEOUS
  Administered 2018-11-26: 7 [IU] via SUBCUTANEOUS
  Administered 2018-11-26 – 2018-11-27 (×4): 2 [IU] via SUBCUTANEOUS
  Administered 2018-11-28 (×2): 1 [IU] via SUBCUTANEOUS
  Administered 2018-11-29: 2 [IU] via SUBCUTANEOUS
  Administered 2018-11-29: 5 [IU] via SUBCUTANEOUS
  Administered 2018-11-30 (×2): 2 [IU] via SUBCUTANEOUS
  Administered 2018-11-30: 1 [IU] via SUBCUTANEOUS
  Administered 2018-12-01: 3 [IU] via SUBCUTANEOUS
  Administered 2018-12-01 (×2): 2 [IU] via SUBCUTANEOUS
  Administered 2018-12-02 – 2018-12-03 (×4): 1 [IU] via SUBCUTANEOUS
  Administered 2018-12-03: 3 [IU] via SUBCUTANEOUS
  Administered 2018-12-04: 2 [IU] via SUBCUTANEOUS
  Administered 2018-12-04: 12 [IU] via SUBCUTANEOUS
  Administered 2018-12-05: 5 [IU] via SUBCUTANEOUS
  Administered 2018-12-06: 3 [IU] via SUBCUTANEOUS
  Administered 2018-12-07: 2 [IU] via SUBCUTANEOUS
  Administered 2018-12-07: 1 [IU] via SUBCUTANEOUS
  Administered 2018-12-08: 2 [IU] via SUBCUTANEOUS
  Administered 2018-12-08: 1 [IU] via SUBCUTANEOUS
  Administered 2018-12-08 – 2018-12-09 (×2): 2 [IU] via SUBCUTANEOUS
  Administered 2018-12-09: 1 [IU] via SUBCUTANEOUS
  Administered 2018-12-10: 2 [IU] via SUBCUTANEOUS
  Administered 2018-12-11: 1 [IU] via SUBCUTANEOUS
  Administered 2018-12-11: 3 [IU] via SUBCUTANEOUS
  Administered 2018-12-13: 2 [IU] via SUBCUTANEOUS
  Administered 2018-12-13: 3 [IU] via SUBCUTANEOUS
  Administered 2018-12-14: 2 [IU] via SUBCUTANEOUS
  Administered 2018-12-14: 5 [IU] via SUBCUTANEOUS
  Administered 2018-12-15: 3 [IU] via SUBCUTANEOUS

## 2018-11-20 MED ORDER — DEXTROSE 50 % IV SOLN
INTRAVENOUS | Status: AC
  Administered 2018-11-20: 25 mL
  Filled 2018-11-20: qty 50

## 2018-11-20 NOTE — Evaluation (Signed)
Physical Therapy Evaluation Patient Details Name: Ryan Medina MRN: [id number] DOB: 06/24/1961 Today's Date: 11/20/2018   History of Present Illness  Pt is a 58 y.o. male with DM, HTN, sCHF EF 45% and polysubstance abuse/homelessness. He presented initially with left leg pain on 12/13, found to have acute thromboembolic ischemic left leg.  He was taken emergently for embolectomy.  Post-op course complicated.  Of primary importance, found to have inoperable pancreatic cancer.  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     Clinical Impression  Pt admitted with above diagnosis. Pt currently with functional limitations due to the deficits listed below (see PT Problem List). On eval, pt required min assist transfers. Mobility limited by 10/10 pain. Pt hold RLE in flexed position. Pt educated on need/importance of ROM R hip/knee into extension. Pt verbalized understanding but states he just can't right now. Pt will benefit from skilled PT to increase their independence and safety with mobility to allow discharge to the venue listed below.       Follow Up Recommendations CIR    Equipment Recommendations  Other (comment)(defer to next venue)    Recommendations for Other Services Rehab consult     Precautions / Restrictions Precautions Precautions: Fall Precaution Comments: Pt reports fall overnight 2/28 trying to use urinal. Restrictions RLE Weight Bearing: Non weight bearing      Mobility  Bed Mobility Overal bed mobility: Needs Assistance             General bed mobility comments: Pt received in recliner. At minimum will require supervision for safety.  Transfers Overall transfer level: Needs assistance Equipment used: Rolling walker (2 wheeled) Transfers: Sit to/from [facility] Sit to Stand: Min assist Stand pivot transfers: Min assist       General transfer comment: cues for sequencing and safety, assist to  power up  Ambulation/Gait         Gait velocity: unable due to pain      Stairs            Wheelchair Mobility    Modified Rankin (Stroke Patients Only)       Balance Overall balance assessment: Needs assistance Sitting-balance support: No upper extremity supported;Feet supported Sitting balance-Leahy Scale: Good     Standing balance support: Bilateral upper extremity supported;During functional activity Standing balance-Leahy Scale: Poor Standing balance comment: reliant on RW, able to stand statically with RW x 4 minutes min/min guard assist                             Pertinent Vitals/Pain Pain Assessment: 0-10 Pain Score: 10-Worst pain ever Pain Location: R residual limb Pain Descriptors / Indicators: Grimacing;Guarding;Sore Pain Intervention(s): Monitored during session;Repositioned;Patient requesting pain meds-RN notified    Home Living Family/patient expects to be discharged to:: Shelter/Homeless                      Prior Function Level of Independence: Independent with assistive device(s)         Comments: ADLs, IADLs, use of SPC for functional mobility     Hand Dominance        Extremity/Trunk Assessment   Upper Extremity Assessment Upper Extremity Assessment: Defer to OT evaluation    Lower Extremity Assessment Lower Extremity Assessment: RLE deficits/detail RLE Deficits / Details: s/p BKA, pt holding in flexed position. Unwilling to attempt ROM/extension at hip/knee. RLE: Unable to fully assess due  to pain    Cervical / Trunk Assessment Cervical / Trunk Assessment: Normal  Communication   Communication: No difficulties  Cognition Arousal/Alertness: Awake/alert Behavior During Therapy: WFL for tasks assessed/performed Overall Cognitive Status: Within Functional Limits for tasks assessed                                 General Comments: Pt mildly irritated at first but willing to participate  with encouragement.      General Comments      Exercises     Assessment/Plan    PT Assessment Patient needs continued PT services  PT Problem List Decreased strength;Decreased balance;Decreased knowledge of precautions;Pain;Decreased mobility;Decreased knowledge of use of DME;Decreased safety awareness;Decreased activity tolerance       PT Treatment Interventions DME instruction;Functional mobility training;Balance training;Patient/family education;Gait training;Therapeutic activities;Wheelchair mobility training;Therapeutic exercise    PT Goals (Current goals can be found in the Care Plan section)  Acute Rehab PT Goals Patient Stated Goal: decrease pain PT Goal Formulation: With patient Time For Goal Achievement: 12/04/18 Potential to Achieve Goals: Good    Frequency Min 3X/week   Barriers to discharge        Co-evaluation PT/OT/SLP Co-Evaluation/Treatment: Yes Reason for Co-Treatment: For patient/therapist safety;To address functional/ADL transfers PT goals addressed during session: Mobility/safety with mobility;Balance;Proper use of DME         AM-PAC PT "6 Clicks" Mobility  Outcome Measure Help needed turning from your back to your side while in a flat bed without using bedrails?: None Help needed moving from lying on your back to sitting on the side of a flat bed without using bedrails?: None Help needed moving to and from a bed to a chair (including a wheelchair)?: A Little Help needed standing up from a chair using your arms (e.g., wheelchair or bedside chair)?: A Little Help needed to walk in hospital room?: A Little Help needed climbing 3-5 steps with a railing? : A Lot 6 Click Score: 19    End of Session Equipment Utilized During Treatment: Gait belt Activity Tolerance: Patient limited by pain Patient left: in chair;with call bell/phone within reach;with chair alarm set Nurse Communication: Mobility status PT Visit Diagnosis: Other abnormalities of gait  and mobility (R26.89);Difficulty in walking, not elsewhere classified (R26.2);Pain;Muscle weakness (generalized) (M62.81) Pain - Right/Left: Right Pain - part of body: Leg    Time: 6333-5456 PT Time Calculation (min) (ACUTE ONLY): 21 min   Charges:   PT Evaluation $PT Eval Moderate Complexity: 1 Mod          Ryan Medina, PT  Office # [phone] Pager [phone]   Ryan Medina 11/20/2018, 9:46 AM

## 2018-11-20 NOTE — Progress Notes (Signed)
[facility] for Heparin  Indication: PE, DVT  Allergies  Allergen Reactions  . Lisinopril Anaphylaxis   Patient Measurements: Height: 5\' 11"  (180.3 cm) Weight: 123 lb 14.4 oz (56.2 kg) IBW/kg (Calculated) : 75.3 Heparin Dosing Weight: 66.7  Vital Signs: Temp: 98.8 F (37.1 C) (02/29 0324) Temp Source: Oral (02/29 0324) BP: 135/106 (02/29 0324) Pulse Rate: 104 (02/29 0324)  Labs: Recent Labs    11/19/18 0401 11/19/18 1148 11/19/18 1946 11/20/18 0355  HGB 10.9* 10.7*  --  10.0*  HCT 33.1* 32.9*  --  30.9*  PLT 365 359  --  339  HEPARINUNFRC 0.17*  --  0.34 0.37  CREATININE  --  0.49*  --  0.55*   Estimated Creatinine Clearance: 81 mL/min (A) (by C-G formula based on SCr of 0.55 mg/dL (L)).  Assessment: 57 yo male acute PE diagnosed on 2/5. Has been using Lovenox and heparin. Currently on heparin gtt.   Heparin resumed after surgery Heparin level therapeutic 0.37 Hgb 10, no bleeding noted.    Goal of Therapy:  Monitor platelets by anticoagulation protocol: Yes Heparin level 0.3-0.5 units/ml   Plan:  Continue heparin gtt at 1,300 units/hr Monitor daily heparin level, CBC, s/s of bleed  Nupur Hohman A. Levada Dy, PharmD, [place] Pager: [phone] Please utilize Amion for appropriate phone number to reach the unit pharmacist ([place])   11/20/2018 11:06 AM

## 2018-11-20 NOTE — Progress Notes (Signed)
CRITICAL VALUE ALERT  Critical Value: CBG 35   Date & Time Notied:  1305  Orders Received/Actions taken: Given graham crackers and OJ and dextrose 50% 25 ml Pt was symptomatic sweating and stated he felt funny.  CBG rechecked at 1320 888. Pt is no longer symptomatic. Pt stated he feels better.

## 2018-11-20 NOTE — Progress Notes (Signed)
  Progress Note    11/20/2018 11:46 AM 1 Day Post-Op  Subjective: Apparently fell trying to get out of bed last night  Vitals:   11/20/18 0005 11/20/18 0324  BP: (!) 125/105 (!) 135/106  Pulse: 92 (!) 104  Resp: 14   Temp: 97.9 F (36.6 C) 98.8 F (37.1 C)  SpO2: 100% 100%    Physical Exam: Awake alert oriented Nonlabored respirations Right BKA site dressing clean dry intact with JP  CBC    Component Value Date/Time   WBC 8.7 11/20/2018 0355   RBC 3.45 (L) 11/20/2018 0355   HGB 10.0 (L) 11/20/2018 0355   HCT 30.9 (L) 11/20/2018 0355   PLT 339 11/20/2018 0355   MCV 89.6 11/20/2018 0355   MCH 29.0 11/20/2018 0355   MCHC 32.4 11/20/2018 0355   RDW 15.8 (H) 11/20/2018 0355   LYMPHSABS 0.8 11/08/2018 0455   MONOABS 0.3 11/08/2018 0455   EOSABS 0.0 11/08/2018 0455   BASOSABS 0.0 11/08/2018 0455    BMET    Component Value Date/Time   NA 131 (L) 11/20/2018 0355   K 4.0 11/20/2018 0355   CL 96 (L) 11/20/2018 0355   CO2 27 11/20/2018 0355   GLUCOSE 276 (H) 11/20/2018 0355   BUN 7 11/20/2018 0355   CREATININE 0.55 (L) 11/20/2018 0355   CREATININE 0.92 05/15/2016 1038   CALCIUM 8.0 (L) 11/20/2018 0355   GFRNONAA >60 11/20/2018 0355   GFRAA >60 11/20/2018 0355    INR    Component Value Date/Time   INR 1.31 11/02/2018 0451     Intake/Output Summary (Last 24 hours) at 11/20/2018 1146 Last data filed at 11/20/2018 1019 Gross per 24 hour  Intake 1662.57 ml  Output 1315 ml  Net 347.57 ml     Assessment:  58 y.o. male is s/p right below-knee amputation, fell last night.  Plan: Counseled on safety to prevent wound breakdown We will remove dressing tomorrow  Scotti Kosta C. Donzetta Matters, MD [facility] Office: [phone] Pager: [phone]  11/20/2018 11:46 AM

## 2018-11-20 NOTE — Evaluation (Signed)
Occupational Therapy Evaluation Patient Details Name: Ryan Medina MRN: [id number] DOB: 11/18/1960 Today's Date: 11/20/2018    History of Present Illness Pt is a 58 y.o. male with DM, HTN, sCHF EF 45% and polysubstance abuse/homelessness. He presented initially with left leg pain on 12/13, found to have acute thromboembolic ischemic left leg.  He was taken emergently for embolectomy.  Post-op course complicated.  Of primary importance, found to have inoperable pancreatic cancer.  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    Clinical Impression   Pt PTA: mostly independent with ADL and mobility. Pt currently limited by pain in RLE. Pt with limtied ROM at knee and enouraged to extend knee throughout the day. Pt requires assist with ADL as pt is unsteady with transfers and standing. Pt appears weak with BUEs in standing with RW. Pt requires additional cueing for safety as pt can be impulsive. Pt would greatly benefit from continued OT skilled services for ADL, mobility and safety.  OT to follow acutely.    Follow Up Recommendations  CIR;Supervision/Assistance - 24 hour    Equipment Recommendations  3 in 1 bedside commode;Other (comment)(to be determined)    Recommendations for Other Services       Precautions / Restrictions Precautions Precautions: Fall Precaution Comments: Pt reports fall overnight 2/28 trying to use urinal. Restrictions Weight Bearing Restrictions: Yes RUE Weight Bearing: Non weight bearing RLE Weight Bearing: Non weight bearing      Mobility Bed Mobility Overal bed mobility: Needs Assistance             General bed mobility comments: Pt received in recliner. At minimum will require supervision for safety.  Transfers Overall transfer level: Needs assistance Equipment used: Rolling walker (2 wheeled) Transfers: Sit to/from [facility] Sit to Stand: Min assist Stand pivot transfers: Min assist       General transfer comment: cues for sequencing and safety, assist to power up    Balance Overall balance assessment: Needs assistance Sitting-balance support: No upper extremity supported;Feet supported Sitting balance-Leahy Scale: Good     Standing balance support: Bilateral upper extremity supported;During functional activity Standing balance-Leahy Scale: Poor Standing balance comment: reliant on RW, able to stand statically with RW x 4 minutes min/min guard assist                           ADL either performed or assessed with clinical judgement   ADL Overall ADL's : Needs assistance/impaired Eating/Feeding: Independent;Sitting   Grooming: Set up;Sitting   Upper Body Bathing: Minimal assistance;Sitting   Lower Body Bathing: Minimal assistance;Sitting/lateral leans   Upper Body Dressing : Minimal assistance;Sitting   Lower Body Dressing: Minimal assistance;Sitting/lateral leans   Toilet Transfer: Minimal assistance;BSC;Stand-pivot;Squat-pivot   Toileting- Clothing Manipulation and Hygiene: Moderate assistance;Sitting/lateral lean       Functional mobility during ADLs: Minimal assistance;Rolling walker General ADL Comments: Pt performing supervisionA to modA for self care and mobility with modA +2 for stability as pt is impulsive.     Vision Baseline Vision/History: No visual deficits Vision Assessment?: No apparent visual deficits     Perception     Praxis      Pertinent Vitals/Pain Pain Assessment: 0-10 Pain Score: 10-Worst pain ever Pain Location: R residual limb Pain Descriptors / Indicators: Grimacing;Guarding;Sore Pain Intervention(s): Limited activity within patient's tolerance;Premedicated before session     Hand Dominance Right   Extremity/Trunk Assessment Upper Extremity Assessment Upper Extremity Assessment: Generalized weakness  Lower Extremity Assessment Lower Extremity Assessment: Generalized weakness;RLE  deficits/detail;Defer to PT evaluation RLE Deficits / Details: s/p BKA, pt holding in flexed position. Unwilling to attempt ROM/extension at hip/knee. RLE: Unable to fully assess due to pain   Cervical / Trunk Assessment Cervical / Trunk Assessment: Normal   Communication Communication Communication: No difficulties   Cognition Arousal/Alertness: Awake/alert Behavior During Therapy: WFL for tasks assessed/performed Overall Cognitive Status: Within Functional Limits for tasks assessed                                 General Comments: Pt mildly irritated at first but willing to participate with encouragement.   General Comments  pt impulsive and has fallen this night before trying to use urinal. pt  to progress as tolerated.    Exercises     Shoulder Instructions      Home Living Family/patient expects to be discharged to:: Shelter/Homeless                                 Additional Comments: Pt reports he is unable to return to place that he was living prior.      Prior Functioning/Environment Level of Independence: Independent with assistive device(s)        Comments: ADLs, IADLs, use of SPC for functional mobility        OT Problem List: Decreased strength;Decreased activity tolerance;Decreased range of motion;Impaired balance (sitting and/or standing);Decreased knowledge of precautions;Cardiopulmonary status limiting activity;Impaired UE functional use;Pain      OT Treatment/Interventions: Self-care/ADL training;Therapeutic exercise;Energy conservation;DME and/or AE instruction;Therapeutic activities    OT Goals(Current goals can be found in the care plan section) Acute Rehab OT Goals Patient Stated Goal: decrease pain OT Goal Formulation: With patient Time For Goal Achievement: 12/04/18 Potential to Achieve Goals: Good ADL Goals Additional ADL Goal #1: pt will perform ADL functional transfers with modified independence.  OT  Frequency: Min 2X/week   Barriers to D/C:            Co-evaluation   Reason for Co-Treatment: For patient/therapist safety;To address functional/ADL transfers PT goals addressed during session: Mobility/safety with mobility;Balance;Proper use of DME        AM-PAC OT "6 Clicks" Daily Activity     Outcome Measure Help from another person eating meals?: None Help from another person taking care of personal grooming?: None Help from another person toileting, which includes using toliet, bedpan, or urinal?: None Help from another person bathing (including washing, rinsing, drying)?: None Help from another person to put on and taking off regular upper body clothing?: None Help from another person to put on and taking off regular lower body clothing?: None 6 Click Score: 24   End of Session Equipment Utilized During Treatment: Rolling walker Nurse Communication: Mobility status  Activity Tolerance: Patient limited by pain;Patient tolerated treatment well Patient left: in chair;with chair alarm set;with call bell/phone within reach  OT Visit Diagnosis: Unsteadiness on feet (R26.81);Muscle weakness (generalized) (M62.81);Pain Pain - Right/Left: Right Pain - part of body: Leg                Time: 9024-0973 OT Time Calculation (min): 22 min Charges:  OT General Charges $OT Visit: 1 Visit OT Evaluation $OT Eval Moderate Complexity: 1 Mod  Darryl Nestle) Marsa Aris OTR/L Acute Rehabilitation Services Pager: [phone] Office: [phone]   Fredda Hammed 11/20/2018, 11:44 AM

## 2018-11-20 NOTE — Progress Notes (Signed)
PROGRESS NOTE    Ryan Medina  BSW:[id number] DOB: April 16, 1961 DOA: 09/03/2018 PCP: Patient, No Pcp Per      Brief Narrative:  Ryan Medina is a 58 y.o. M with DM, HTN, sCHF EF 45% and polysubstance abuse/homelessness who presented initially with left leg pain on 12/13, found to have acute thromboembolic ischemic left leg.  Taken emergently for embolectomy.  Post-op course complicated.  Of primary importance, found to have inoperable pancreatic cancer.  In brief, right leg progressively worse, ultimately gangrenous requiring BKA; difficult balance between -- recurrent thromboembolic events due to malignancy vs. severe and recurrent GIB from duodenal ulcer (erosion from malignancy), all punctuated by pneumonia sepsis, now resolved.   Please see separate summary by me from 2/28.      Assessment & Plan:  Right leg gangrene S/p embolectomy x2 S/p BKA 2/28 [name] out of bed last night, pain at right stump more severe today.  VVS aware.  -PT eval   Gastric outlet obstruction Severe protein calorie malnutrition S/p gastrojejunostomy Stable -Consult dietitian -Continue Ensure  Acute blood loss anemia on anemia of chronic disease Hematochezia Malignant duodenal ulcer Stable, no bleeding -Trend Hgb three times weekly -Continue PPI and sucralfate  Pacreatic adenoCA -Consult Oncology, appreciate cares  Candida esophagitis  Pulmonary embolism -Continue heparin gtt for now  Chronic systolic and diastolic CHF Stable  Diabetes Hypoglycemic today.  Sugars labile. -Continue SSI, reduce dose  Sepsis from HCAP, Pseudomonas UTI Treated with Cefepime 1/11-1/16, fever resolved.  Stage 2 pressure ulcer coccyx, not POA          MDM and disposition: Below labs and imaging reports reviewed and summarized above.  Medication management as above.  The patient was admitted with an ischemic leg, found to have pancreatic cancer and hypercoagulability causing multiple  complications, including a prolonged gastrointestinal hemorrhage.  His GI bleeding is essentially resolved now, and he has been stable on heparin for some days.  He finally had to undergo right leg BKA due to gangrene, and he is progressing well from this despite having fallen and hurt in the leg yesterday.  Provided that his leg is okay, and vascular surgery signed off, will continue to work towards disposition and rehab and transition to an oral anticoagulant, so that he can progress towards initiating palliative chemotherapy.          DVT prophylaxis: N/A on heparin Code Status: FULL Family Communication: None present     Procedures:   See separate documentation      Subjective: Appetite good, no fever, no confusion, cough, dyspnea, chest pain, rectal bleeding.  He fell out of bed overnight, and his stump is quite painful.     Objective: Vitals:   11/19/18 2007 11/20/18 0005 11/20/18 0324 11/20/18 1354  BP: (!) 123/99 (!) 125/105 (!) 135/106 (!) 130/101  Pulse: 91 92 (!) 104 (!) 105  Resp: 18 14  16   Temp: 98.5 F (36.9 C) 97.9 F (36.6 C) 98.8 F (37.1 C) (!) 97.5 F (36.4 C)  TempSrc: Oral Oral Oral Oral  SpO2: 100% 100% 100% 100%  Weight:      Height:        Intake/Output Summary (Last 24 hours) at 11/20/2018 1521 Last data filed at 11/20/2018 1428 Gross per 24 hour  Intake 2867.37 ml  Output 1915 ml  Net 952.37 ml   Filed Weights   11/02/18 1806 11/04/18 0831 11/12/18 0742  Weight: 56.2 kg 56.2 kg 56.2 kg    Examination: General appearance: Cachectic  adult male, interactive, no acute distress HEENT: Anicteric, conjunctival pink, lids and lashes normal.  No nasal deformity, discharge, epistaxis.  Lips moist, dentition normal, oropharynx moist, no oral lesions, hearing normal Skin: Warm and dry.  No suspicious rashes or lesions. Cardiac: Tachycardic, regular, no murmurs, JVP normal, no lower extremity edema. Respiratory: Normal respiratory rate and  rhythm, lungs clear without rales or wheezes Abdomen: Abdominal incision is clean dry and intact.  Abdomen examination soft, no tenderness to palpation, ascites or distention. MSK: Few severe loss of subcutaneous muscle mass and fat, temporal wasting. Neuro: Awake and alert, extraocular movements intact, moves all extremities, speech fluent.    Psych: Sensorium intact and responding to questions, attention normal, judgment and insight appear normal.     Data Reviewed: I have personally reviewed following labs and imaging studies:  CBC: Recent Labs  Lab 11/17/18 0408 11/18/18 0318 11/19/18 0401 11/19/18 1148 11/20/18 0355  WBC 7.1 7.0 7.6 7.2 8.7  HGB 10.9* 10.0* 10.9* 10.7* 10.0*  HCT 35.8* 32.2* 33.1* 32.9* 30.9*  MCV 94.0 93.6 89.2 88.9 89.6  PLT 371 341 365 359 381   Basic Metabolic Panel: Recent Labs  Lab 11/15/18 0500 11/15/18 0700 11/16/18 0109 11/17/18 0408 11/19/18 1148 11/20/18 0355  NA PATIENT IDENTIFICATION ERROR. PLEASE DISREGARD RESULTS. ACCOUNT WILL BE CREDITED. 135 136 136  --  131*  K PATIENT IDENTIFICATION ERROR. PLEASE DISREGARD RESULTS. ACCOUNT WILL BE CREDITED. 4.2 4.0 3.8  --  4.0  CL PATIENT IDENTIFICATION ERROR. PLEASE DISREGARD RESULTS. ACCOUNT WILL BE CREDITED. 101 101 103  --  96*  CO2 PATIENT IDENTIFICATION ERROR. PLEASE DISREGARD RESULTS. ACCOUNT WILL BE CREDITED. 25 27 26   --  27  GLUCOSE PATIENT IDENTIFICATION ERROR. PLEASE DISREGARD RESULTS. ACCOUNT WILL BE CREDITED. 227* 154* 254*  --  276*  BUN PATIENT IDENTIFICATION ERROR. PLEASE DISREGARD RESULTS. ACCOUNT WILL BE CREDITED. 12 11 16   --  7  CREATININE PATIENT IDENTIFICATION ERROR. PLEASE DISREGARD RESULTS. ACCOUNT WILL BE CREDITED. 0.48* 0.48* 0.57* 0.49* 0.55*  CALCIUM PATIENT IDENTIFICATION ERROR. PLEASE DISREGARD RESULTS. ACCOUNT WILL BE CREDITED. 8.1* 8.4* 8.4*  --  8.0*   GFR: Estimated Creatinine Clearance: 81 mL/min (A) (by C-G formula based on SCr of 0.55 mg/dL (L)). Liver  Function Tests: No results for input(s): AST, ALT, ALKPHOS, BILITOT, PROT, ALBUMIN in the last 168 hours. No results for input(s): LIPASE, AMYLASE in the last 168 hours. No results for input(s): AMMONIA in the last 168 hours. Coagulation Profile: No results for input(s): INR, PROTIME in the last 168 hours. Cardiac Enzymes: No results for input(s): CKTOTAL, CKMB, CKMBINDEX, TROPONINI in the last 168 hours. BNP (last 3 results) No results for input(s): PROBNP in the last 8760 hours. HbA1C: No results for input(s): HGBA1C in the last 72 hours. CBG: Recent Labs  Lab 11/19/18 2154 11/20/18 0802 11/20/18 1123 11/20/18 1309 11/20/18 1320  GLUCAP 283* 150* 254* 35* 88   Lipid Profile: No results for input(s): CHOL, HDL, LDLCALC, TRIG, CHOLHDL, LDLDIRECT in the last 72 hours. Thyroid Function Tests: No results for input(s): TSH, T4TOTAL, FREET4, T3FREE, THYROIDAB in the last 72 hours. Anemia Panel: No results for input(s): VITAMINB12, FOLATE, FERRITIN, TIBC, IRON, RETICCTPCT in the last 72 hours. Urine analysis:    Component Value Date/Time   COLORURINE YELLOW 10/10/2018 2303   APPEARANCEUR CLEAR 10/10/2018 2303   LABSPEC 1.022 10/10/2018 2303   PHURINE 6.0 10/10/2018 2303   GLUCOSEU >=500 (A) 10/10/2018 2303   HGBUR NEGATIVE 10/10/2018 2303   BILIRUBINUR NEGATIVE 10/10/2018  [place] 10/10/2018 2303   PROTEINUR NEGATIVE 10/10/2018 2303   UROBILINOGEN 4.0 (H) 12/05/2016 0921   NITRITE NEGATIVE 10/10/2018 2303   LEUKOCYTESUR NEGATIVE 10/10/2018 2303   Sepsis Labs: @LABRCNTIP (procalcitonin:4,lacticacidven:4)  ) Recent Results (from the past 240 hour(s))  Surgical pcr screen     Status: None   Collection Time: 11/18/18  2:58 PM  Result Value Ref Range Status   MRSA, PCR NEGATIVE NEGATIVE Final   Staphylococcus aureus NEGATIVE NEGATIVE Final    Comment: (NOTE) The Xpert SA Assay (FDA approved for NASAL specimens in patients 44 years of age and older), is one  component of a comprehensive surveillance program. It is not intended to diagnose infection nor to guide or monitor treatment. Performed at [facility], [place] [address] [place], [place] [address]          Radiology Studies: No results found.      Scheduled Meds: . acetaminophen  1,000 mg Oral Q8H  . docusate sodium  100 mg Oral Daily  . feeding supplement (ENSURE ENLIVE)  237 mL Oral TID BM  . insulin aspart  0-20 Units Subcutaneous TID WC  . insulin aspart  0-5 Units Subcutaneous QHS  . pantoprazole  40 mg Oral BID  . sodium chloride flush  10-40 mL Intracatheter Q12H  . sucralfate  1 g Oral TID WC & HS   Continuous Infusions: . sodium chloride 10 mL/hr at 11/17/18 1500  . dextrose 5 % and 0.45 % NaCl with KCl 20 mEq/L 50 mL/hr at 11/20/18 1405  . heparin 1,300 Units/hr (11/20/18 1302)  . magnesium sulfate 1 - 4 g bolus IVPB       LOS: 78 days    Time spent: 25 minutes    Edwin Dada, MD [facility] 11/20/2018, 3:21 PM     Please page through [place]:  [email or web address] Password [email or web address] If 7PM-7AM, please contact night-coverage

## 2018-11-20 NOTE — Progress Notes (Signed)
Per Charge Nurse and Staff Nurse Carmell [place], Pt was found crouched beside bed on floor in a squat like position and had slipped in urine? The fall/slip was unwitnessed.   Vitals signs taken and assessment and inquiry of patient was completed. Pt denies hitting his head. Text  Out and call back from Dr. Silas [place]. Instructions to continue to monitor the patient and upgrade his fall risk status from moderate to high fall risk. Per MD instructions upgrades implemented and family called member notified Tanya Harris(sister) [phone].    On intial rounds yellow sock was placed on L foot. Pt is now and continues to be AAOX4, on room air., pox sat 100. IVF's infusing without difficulty thru R chest port and LAFA #18g. R BKA has ace wrap C/D/I.  JP noted with small amount of bloody drainage, emptied and charged q4 per MD orders. During shift pt has C/O R BKA pain rated 6-10/10. After prn pain medications. Follow up pain rated 3-6/10. Even with R BKA discomfort, his appetite has been very good. Voiding without difficulty and able to transfer to chair next to bed unassisted and voiding with the urinal unassisted. The prior mentioned actions were witnessed by me and the NT [place]. Notably I personally changed bed linen after emptying JP drain. I witnessed pt transfer unassisted and steady to chair and back to bed while I changed linens out. Also, noted no urine was spilled, left sock was dry, gown dry, and R BKA dressing and drain C/D/I. Pt stated " I slipped off the bed sheet". Pt denies hitting his head.   Dayshift Nurse updated and safety zone portal completed. Post huddle form completed and placed in directors mailbox.

## 2018-11-21 LAB — CBC
HCT: 29 % — ABNORMAL LOW (ref 39.0–52.0)
Hemoglobin: 9.6 g/dL — ABNORMAL LOW (ref 13.0–17.0)
MCH: 28.6 pg (ref 26.0–34.0)
MCHC: 33.1 g/dL (ref 30.0–36.0)
MCV: 86.3 fL (ref 80.0–100.0)
Platelets: 315 10*3/uL (ref 150–400)
RBC: 3.36 MIL/uL — ABNORMAL LOW (ref 4.22–5.81)
RDW: 15.3 % (ref 11.5–15.5)
WBC: 11.1 10*3/uL — ABNORMAL HIGH (ref 4.0–10.5)
nRBC: 0 % (ref 0.0–0.2)

## 2018-11-21 LAB — GLUCOSE, CAPILLARY
GLUCOSE-CAPILLARY: 142 mg/dL — AB (ref 70–99)
Glucose-Capillary: 196 mg/dL — ABNORMAL HIGH (ref 70–99)
Glucose-Capillary: 203 mg/dL — ABNORMAL HIGH (ref 70–99)
Glucose-Capillary: 178 mg/dL — ABNORMAL HIGH (ref 70–99)
Glucose-Capillary: 260 mg/dL — ABNORMAL HIGH (ref 70–99)

## 2018-11-21 LAB — BASIC METABOLIC PANEL
Anion gap: 9 (ref 5–15)
BUN: 8 mg/dL (ref 6–20)
CALCIUM: 8.3 mg/dL — AB (ref 8.9–10.3)
CO2: 28 mmol/L (ref 22–32)
Chloride: 96 mmol/L — ABNORMAL LOW (ref 98–111)
Creatinine, Ser: 0.54 mg/dL — ABNORMAL LOW (ref 0.61–1.24)
GFR calc Af Amer: >60 mL/min (ref >60)
GFR calc non Af Amer: >60 mL/min (ref >60)
Glucose, Bld: 161 mg/dL — ABNORMAL HIGH (ref 70–99)
Potassium: 3.7 mmol/L (ref 3.5–5.1)
Sodium: 133 mmol/L — ABNORMAL LOW (ref 135–145)

## 2018-11-21 LAB — HEPARIN LEVEL (UNFRACTIONATED)
Heparin Unfractionated: 0.19 IU/mL — ABNORMAL LOW (ref 0.30–0.70)
Heparin Unfractionated: 0.39 IU/mL (ref 0.30–0.70)

## 2018-11-21 MED ORDER — HALOPERIDOL LACTATE 5 MG/ML IJ SOLN
5.0000 mg | Freq: Once | INTRAMUSCULAR | Status: AC
  Administered 2018-11-21: 5 mg via INTRAVENOUS
  Filled 2018-11-21: qty 1

## 2018-11-21 MED ORDER — POLYETHYLENE GLYCOL 3350 17 G PO PACK
17.0000 g | PACK | Freq: Every day | ORAL | Status: DC
  Administered 2018-11-21 – 2018-12-07 (×8): 17 g via ORAL
  Filled 2018-11-21 (×19): qty 1

## 2018-11-21 NOTE — Progress Notes (Addendum)
[place] for Heparin  Indication: PE, DVT  Allergies  Allergen Reactions  . Lisinopril Anaphylaxis   Patient Measurements: Height: 5\' 11"  (180.3 cm) Weight: 123 lb 14.4 oz (56.2 kg) IBW/kg (Calculated) : 75.3 Heparin Dosing Weight: 66.7  Vital Signs: Temp: 98.5 F (36.9 C) (03/01 0458) Temp Source: Oral (03/01 0458) BP: 133/94 (03/01 0458) Pulse Rate: 106 (03/01 0524)  Labs: Recent Labs    11/19/18 1148 11/19/18 1946 11/20/18 0355 11/21/18 0428  HGB 10.7*  --  10.0* 9.6*  HCT 32.9*  --  30.9* 29.0*  PLT 359  --  339 315  HEPARINUNFRC  --  0.34 0.37 0.19*  CREATININE 0.49*  --  0.55* 0.54*   Estimated Creatinine Clearance: 81 mL/min (A) (by C-G formula based on SCr of 0.54 mg/dL (L)).  Assessment: 58 yo male acute PE diagnosed on 2/5. Has been using Lovenox and heparin. Currently on heparin gtt.   Heparin resumed after surgery  Patient pulled peripheral lines overnight - heparin off at 2245, resumed at 0028 per [facility]. Spoke with RN this morning and heparin is now infusing through the port. Heparin level drawn peripherally by lab at 0428. No other interruptions noted.   Heparin level is 0.19 - expected to be low since lab was drawn 4 hours after re-initiation of heparin. Previous levels have been therapeutic at this rate.  Hgb 9.6, no bleeding noted.    Goal of Therapy:  Monitor platelets by anticoagulation protocol: Yes Heparin level 0.3-0.5 units/ml   Plan:  Continue heparin gtt at 1,300 units/hr Recheck heparin level now that it has been > 6 hours since restarting heparin Monitor daily heparin level, CBC, s/s of bleed  Vertis Kelch, PharmD PGY1 Pharmacy Resident Phone [phone] 11/21/2018       7:43 AM  Addendum: Repeat heparin level therapeutic at 0.39 Continue heparin gtt at 1300 units/hr Monitor daily heparin level, CBC, s/s of bleeding  Vertis Kelch, PharmD PGY1 Pharmacy Resident Phone [phone] 11/21/2018       10:25 AM

## 2018-11-21 NOTE — Progress Notes (Signed)
Pt continues to try and get OOB. Ativan ineffective. Pulled out 2 peripheral IV lines. Call made for Haldol which was received and will be administered along with 1:1 supervision which is unavailable at this time. Bed in lowest position. Fall mats in place. Bed alarm on and active. This nurse continues to sit outside door for close monitoring.

## 2018-11-21 NOTE — Progress Notes (Signed)
Attempted to remove JP drain butr had some resistance.Attempt stopped and MD made aware and he stated he would remove. Will continue to monitor pt.

## 2018-11-21 NOTE — Progress Notes (Signed)
Occupational Therapy Evaluation Patient Details Name: Ryan Medina MRN: [id number] DOB: [birth year] Today's Date: 11/21/2018    History of Present Illness Pt is a 58 y.o. male with DM, HTN, sCHF EF 45% and polysubstance abuse/homelessness. He presented initially with left leg pain on 12/13, found to have acute thromboembolic ischemic left leg.  He was taken emergently for embolectomy.  Post-op course complicated.  Of primary importance, found to have inoperable pancreatic cancer.  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    Clinical Impression   Pt seen for repositioning due to pain. Pt making loud noises in room. Pt modified independent with bed mobility with use of railing and minguardA for stand pivot transfers. Pt refused to use RW for transfer today. Pt performing light exercise to RLE- about 10* of knee extension in RLE noted. Pt restless and transferred to bed with minguardA as his pain was uncontrolled. Pt education on desensitizing RLE. Pt requires increased assist for ADL, mobility and safety in CIR setting. OT to focus on safety, energy conservation and ADL acutely.    Follow Up Recommendations  CIR;Supervision/Assistance - 24 hour    Equipment Recommendations  3 in 1 bedside commode    Recommendations for Other Services       Precautions / Restrictions Precautions Precautions: Fall Restrictions Weight Bearing Restrictions: Yes RLE Weight Bearing: Non weight bearing      Mobility Bed Mobility Overal bed mobility: Needs Assistance Bed Mobility: Sidelying to Sit   Sidelying to sit: Supervision          Transfers Overall transfer level: Needs assistance Equipment used: Rolling walker (2 wheeled) Transfers: Sit to/from [facility] Sit to Stand: Min guard Stand pivot transfers: Min guard       General transfer comment: cues for sequencing and safety, assist to power up    Balance Overall balance  assessment: Needs assistance   Sitting balance-Leahy Scale: Good       Standing balance-Leahy Scale: Poor Standing balance comment: reliant on RW if more than a transfer                           ADL either performed or assessed with clinical judgement   ADL Overall ADL's : Needs assistance/impaired Eating/Feeding: Independent;Sitting                                   Functional mobility during ADLs: Min guard;Cueing for safety(transfer only.) General ADL Comments: Pt less impulsive today with transfers and waited for OTR to finish setting a place for recliner before standing.     Vision   Vision Assessment?: No apparent visual deficits     Perception     Praxis      Pertinent Vitals/Pain Pain Assessment: 0-10 Pain Score: 9  Pain Location: R residual limb Pain Descriptors / Indicators: Grimacing;Guarding;Sore Pain Intervention(s): Limited activity within patient's tolerance     Hand Dominance     Extremity/Trunk Assessment Upper Extremity Assessment Upper Extremity Assessment: Generalized weakness   Lower Extremity Assessment Lower Extremity Assessment: Generalized weakness RLE Deficits / Details: s/p BKA. pt attempting to straighten RLE, but it remains flexed       Communication     Cognition Arousal/Alertness: Awake/alert Behavior During Therapy: WFL for tasks assessed/performed Overall Cognitive Status: Within Functional Limits for tasks assessed  General Comments: Pt restless with pain in RLE as RN attempting to give pain meds/   General Comments  Pt with difficulty trying to get comfortable in chair and then in bed due to severe pain.    Exercises General Exercises - Upper Extremity Chair Push Up: 5 reps;Seated General Exercises - Lower Extremity Ankle Circles/Pumps: AROM;10 reps;Seated Quad Sets: AROM;5 reps;Seated Straight Leg Raises: 5 reps;AROM;Seated   Shoulder  Instructions      Home Living                                          Prior Functioning/Environment                   OT Problem List:        OT Treatment/Interventions:      OT Goals(Current goals can be found in the care plan section) Acute Rehab OT Goals Patient Stated Goal: decrease pain OT Goal Formulation: With patient Time For Goal Achievement: 12/04/18 Potential to Achieve Goals: Good ADL Goals Pt Will Perform Grooming: with supervision;with set-up;standing Pt Will Perform Lower Body Dressing: with set-up;with supervision;sit to/from stand Pt Will Transfer to Toilet: with supervision;ambulating;bedside commode Pt Will Perform Toileting - Clothing Manipulation and hygiene: with supervision;sit to/from stand Pt/caregiver will Perform Home Exercise Program: Increased strength;Both right and left upper extremity;With written HEP provided Additional ADL Goal #1: pt will perform ADL functional transfers with modified independence.  OT Frequency: Min 2X/week   Barriers to D/C:            Co-evaluation              AM-PAC OT "6 Clicks" Daily Activity     Outcome Measure Help from another person eating meals?: None Help from another person taking care of personal grooming?: None Help from another person toileting, which includes using toliet, bedpan, or urinal?: None Help from another person bathing (including washing, rinsing, drying)?: A Little     6 Click Score: 15   End of Session Nurse Communication: Mobility status  Activity Tolerance: Patient limited by pain Patient left: in bed;with call bell/phone within reach  OT Visit Diagnosis: Unsteadiness on feet (R26.81);Muscle weakness (generalized) (M62.81) Pain - Right/Left: Right Pain - part of body: Leg                Time: 1447-1500 OT Time Calculation (min): 13 min Charges:  OT General Charges $OT Visit: 1 Visit OT Treatments $Neuromuscular Re-education: 8-22 mins  Ebony Hail  Harold Hedge) Marsa Aris OTR/L Acute Rehabilitation Services Pager: [phone] Office: [phone]   Fredda Hammed 11/21/2018, 3:33 PM

## 2018-11-21 NOTE — Progress Notes (Signed)
  Progress Note    11/21/2018 1:20 PM 2 Days Post-Op  Subjective: Feeling okay, was confused received Haldol overnight  Vitals:   11/21/18 0458 11/21/18 0524  BP: (!) 133/94   Pulse: (!) 157 (!) 106  Resp: 14   Temp: 98.5 F (36.9 C)   SpO2: 99% 100%    Physical Exam: Awake alert oriented Right below-knee amputation site is intact with staples drain in place without significant drainage in the bulb.  CBC    Component Value Date/Time   WBC 11.1 (H) 11/21/2018 0428   RBC 3.36 (L) 11/21/2018 0428   HGB 9.6 (L) 11/21/2018 0428   HCT 29.0 (L) 11/21/2018 0428   PLT 315 11/21/2018 0428   MCV 86.3 11/21/2018 0428   MCH 28.6 11/21/2018 0428   MCHC 33.1 11/21/2018 0428   RDW 15.3 11/21/2018 0428   LYMPHSABS 0.8 11/08/2018 0455   MONOABS 0.3 11/08/2018 0455   EOSABS 0.0 11/08/2018 0455   BASOSABS 0.0 11/08/2018 0455    BMET    Component Value Date/Time   NA 133 (L) 11/21/2018 0428   K 3.7 11/21/2018 0428   CL 96 (L) 11/21/2018 0428   CO2 28 11/21/2018 0428   GLUCOSE 161 (H) 11/21/2018 0428   BUN 8 11/21/2018 0428   CREATININE 0.54 (L) 11/21/2018 0428   CREATININE 0.92 05/15/2016 1038   CALCIUM 8.3 (L) 11/21/2018 0428   GFRNONAA >60 11/21/2018 0428   GFRAA >60 11/21/2018 0428    INR    Component Value Date/Time   INR 1.31 11/02/2018 0451     Intake/Output Summary (Last 24 hours) at 11/21/2018 1320 Last data filed at 11/21/2018 0824 Gross per 24 hour  Intake 1044.82 ml  Output 870 ml  Net 174.82 ml     Assessment:  58 y.o. male is s/p right below-knee amputation wound looking good today.  Plan: JP drain out today I have encouraged knee mobilization   Karas Pickerill C. Donzetta Matters, MD [facility] Office: [phone] Pager: [phone]  11/21/2018 1:20 PM

## 2018-11-21 NOTE — Progress Notes (Signed)
PROGRESS NOTE    Ryan Medina  TMH:[id number] DOB: 1961/08/08 DOA: 09/03/2018 PCP: Patient, No Pcp Per      Brief Narrative:  Ryan Medina is a 58 y.o. M with DM, HTN, sCHF EF 45% and polysubstance abuse/homelessness who presented initially with left leg pain on 12/13, found to have acute thromboembolic ischemic left leg.  Taken emergently for embolectomy.  Post-op course complicated.  Of primary importance, found to have inoperable pancreatic cancer.  In brief, right leg progressively worse, ultimately gangrenous requiring BKA; difficult balance between -- recurrent thromboembolic events due to malignancy vs. severe and recurrent GIB from duodenal ulcer (erosion from malignancy), all punctuated by pneumonia sepsis, now resolved.   Please see separate summary by me from 2/28.      Assessment & Plan:  Right leg gangrene S/p embolectomy x2 S/p BKA 2/28 JP drain to be removed by [facility] today.   -PT eval -Scheduled acetaminophen, as needed oxycodone for pain    Delirium Patient agitated, disoriented, removing lines, and not participating in cares last night.  This is because of a standing order for 3 times daily PRN lorazepam, which is inappropriate. He is completely oriented today.   -Stop lorazepam  Gastric outlet obstruction Severe protein calorie malnutrition S/p gastrojejunostomy Stable -Consult dietitian -Continue Ensure  Acute blood loss anemia on anemia of chronic disease Hematochezia Malignant duodenal ulcer Stable, no hematochezia or melena -Trend Hgb three times weekly -Continue PPI and sucralfate  Pacreatic adenoCA -Consult Oncology, appreciate cares  Candida esophagitis  Pulmonary embolism -Continue heparin drip for now  Chronic systolic and diastolic CHF Stable  Diabetes Hypoglycemic today.  Sugars labile. -Continue SSI  Sepsis from HCAP, Pseudomonas UTI Treated with Cefepime 1/11-1/16, fever resolved.  Stage 2 pressure ulcer coccyx, not  POA          MDM and disposition: The below labs and imaging reports were reviewed and summarized above.  Medication management as above.  The patient was admitted with an ischemic leg, found to have pancreatic cancer and hypercoagulability causing multiple complications, including a prolonged gastrointestinal hemorrhage.     His GI bleeding is essentially resolved now, and he has been stable on heparin for some days.  He finally had to undergo right leg BKA due to gangrene, and he is progressing well from this.    Provided that his leg is okay, and vascular surgery signs off, will continue to work towards disposition and rehab and transition to an oral anticoagulant, so that he can progress towards initiating palliative chemotherapy.          DVT prophylaxis: N/A on heparin Code Status: FULL Family Communication: None present     Procedures:   See separate documentation      Subjective: No rectal bleeding, hematochezia, melena.  Appetite good, no fever, no cough, dyspnea, chest pain.  Confused last night after lorazepam.      Objective: Vitals:   11/20/18 2014 11/21/18 0041 11/21/18 0458 11/21/18 0524  BP: (!) 125/95 (!) 125/103 (!) 133/94   Pulse: (!) 106 (!) 114 (!) 157 (!) 106  Resp: 16 14 14    Temp: 99.1 F (37.3 C) 98.6 F (37 C) 98.5 F (36.9 C)   TempSrc: Oral Oral Oral   SpO2: 100% 100% 99% 100%  Weight:      Height:        Intake/Output Summary (Last 24 hours) at 11/21/2018 1337 Last data filed at 11/21/2018 0824 Gross per 24 hour  Intake 1044.82 ml  Output  870 ml  Net 174.82 ml   Filed Weights   11/02/18 1806 11/04/18 0831 11/12/18 0742  Weight: 56.2 kg 56.2 kg 56.2 kg    Examination: General appearance: Cachectic adult male, interactive, no acute distress HEENT: Anicteric, conjunctival pink, lids and lashes normal.  No nasal deformity, discharge, epistaxis.  Lips moist, dentition normal, oropharynx moist, no oral lesions, hearing  normal Skin: Warm and dry.  No suspicious rashes or lesions. Cardiac: Tachycardic, regular, no murmurs, no lower extremity edema Respiratory: Normal respiratory rate and rhythm, lungs clear without rales or wheezes Abdomen: Abdomen incision is clean dry and intact, abdomen is soft, no distention or hepatosplenomegaly. MSK: Extremely severe loss of subcutaneous muscle mass and fat, temporal wasting.  Right BKA, incision looks mostly well approximated with scant serosanguineous drainage. Neuro: Awake and alert, extraocular movements intact, moves all extremities, speech fluent. Psych: Sensorium intact and responding to questions, attention normal, judgment and insight appear normal.     Data Reviewed: I have personally reviewed following labs and imaging studies:  CBC: Recent Labs  Lab 11/18/18 0318 11/19/18 0401 11/19/18 1148 11/20/18 0355 11/21/18 0428  WBC 7.0 7.6 7.2 8.7 11.1*  HGB 10.0* 10.9* 10.7* 10.0* 9.6*  HCT 32.2* 33.1* 32.9* 30.9* 29.0*  MCV 93.6 89.2 88.9 89.6 86.3  PLT 341 365 359 339 938   Basic Metabolic Panel: Recent Labs  Lab 11/15/18 0700 11/16/18 0109 11/17/18 0408 11/19/18 1148 11/20/18 0355 11/21/18 0428  NA 135 136 136  --  131* 133*  K 4.2 4.0 3.8  --  4.0 3.7  CL 101 101 103  --  96* 96*  CO2 25 27 26   --  27 28  GLUCOSE 227* 154* 254*  --  276* 161*  BUN 12 11 16   --  7 8  CREATININE 0.48* 0.48* 0.57* 0.49* 0.55* 0.54*  CALCIUM 8.1* 8.4* 8.4*  --  8.0* 8.3*   GFR: Estimated Creatinine Clearance: 81 mL/min (A) (by C-G formula based on SCr of 0.54 mg/dL (L)). Liver Function Tests: No results for input(s): AST, ALT, ALKPHOS, BILITOT, PROT, ALBUMIN in the last 168 hours. No results for input(s): LIPASE, AMYLASE in the last 168 hours. No results for input(s): AMMONIA in the last 168 hours. Coagulation Profile: No results for input(s): INR, PROTIME in the last 168 hours. Cardiac Enzymes: No results for input(s): CKTOTAL, CKMB, CKMBINDEX,  TROPONINI in the last 168 hours. BNP (last 3 results) No results for input(s): PROBNP in the last 8760 hours. HbA1C: No results for input(s): HGBA1C in the last 72 hours. CBG: Recent Labs  Lab 11/20/18 1633 11/20/18 2144 11/21/18 0827 11/21/18 1007 11/21/18 1334  GLUCAP 151* 123* 196* 203* 178*   Lipid Profile: No results for input(s): CHOL, HDL, LDLCALC, TRIG, CHOLHDL, LDLDIRECT in the last 72 hours. Thyroid Function Tests: No results for input(s): TSH, T4TOTAL, FREET4, T3FREE, THYROIDAB in the last 72 hours. Anemia Panel: No results for input(s): VITAMINB12, FOLATE, FERRITIN, TIBC, IRON, RETICCTPCT in the last 72 hours. Urine analysis:    Component Value Date/Time   COLORURINE YELLOW 10/10/2018 2303   APPEARANCEUR CLEAR 10/10/2018 2303   LABSPEC 1.022 10/10/2018 2303   PHURINE 6.0 10/10/2018 2303   GLUCOSEU >=500 (A) 10/10/2018 2303   HGBUR NEGATIVE 10/10/2018 2303   BILIRUBINUR NEGATIVE 10/10/2018 2303   KETONESUR NEGATIVE 10/10/2018 2303   PROTEINUR NEGATIVE 10/10/2018 2303   UROBILINOGEN 4.0 (H) 12/05/2016 0921   NITRITE NEGATIVE 10/10/2018 2303   LEUKOCYTESUR NEGATIVE 10/10/2018 2303   Sepsis Labs: @  LABRCNTIP(procalcitonin:4,lacticacidven:4)  ) Recent Results (from the past 240 hour(s))  Surgical pcr screen     Status: None   Collection Time: 11/18/18  2:58 PM  Result Value Ref Range Status   MRSA, PCR NEGATIVE NEGATIVE Final   Staphylococcus aureus NEGATIVE NEGATIVE Final    Comment: (NOTE) The Xpert SA Assay (FDA approved for NASAL specimens in patients 31 years of age and older), is one component of a comprehensive surveillance program. It is not intended to diagnose infection nor to guide or monitor treatment. Performed at [facility], [place] [address], [place], [place] [address]          Radiology Studies: No results found.      Scheduled Meds: . acetaminophen  1,000 mg Oral Q8H  . docusate sodium  100 mg Oral Daily  . feeding  supplement (ENSURE ENLIVE)  237 mL Oral TID BM  . insulin aspart  0-5 Units Subcutaneous QHS  . insulin aspart  0-9 Units Subcutaneous TID WC  . pantoprazole  40 mg Oral BID  . polyethylene glycol  17 g Oral Daily  . sodium chloride flush  10-40 mL Intracatheter Q12H  . sucralfate  1 g Oral TID WC & HS   Continuous Infusions: . sodium chloride 10 mL/hr at 11/17/18 1500  . dextrose 5 % and 0.45 % NaCl with KCl 20 mEq/L Stopped (11/21/18 0039)  . heparin Stopped (11/21/18 0036)  . magnesium sulfate 1 - 4 g bolus IVPB       LOS: 79 days    Time spent: 25 minutes    Edwin Dada, MD [facility] 11/21/2018, 1:37 PM     Please page through [place]:  [email or web address] Password [email or web address] If 7PM-7AM, please contact night-coverage

## 2018-11-21 NOTE — Progress Notes (Signed)
[facility] for Heparin  Indication: PE, DVT  Allergies  Allergen Reactions  . Lisinopril Anaphylaxis   Patient Measurements: Height: 5\' 11"  (180.3 cm) Weight: 123 lb 14.4 oz (56.2 kg) IBW/kg (Calculated) : 75.3 Heparin Dosing Weight: 66.7  Vital Signs: Temp: 98.5 F (36.9 C) (03/01 0458) Temp Source: Oral (03/01 0458) BP: 133/94 (03/01 0458) Pulse Rate: 106 (03/01 0524)  Labs: Recent Labs    11/19/18 1148  11/20/18 0355 11/21/18 0428 11/21/18 0929  HGB 10.7*  --  10.0* 9.6*  --   HCT 32.9*  --  30.9* 29.0*  --   PLT 359  --  339 315  --   HEPARINUNFRC  --    < > 0.37 0.19* 0.39  CREATININE 0.49*  --  0.55* 0.54*  --    < > = values in this interval not displayed.   Estimated Creatinine Clearance: 81 mL/min (A) (by C-G formula based on SCr of 0.54 mg/dL (L)).  Assessment: 58 yo male acute PE diagnosed on 2/5. Has been using Lovenox and heparin. Currently on heparin gtt.   Heparin resumed after surgery  Patient pulled peripheral lines overnight - heparin off at 2245, resumed at 0028 per [facility]. Spoke with RN this morning and heparin is now infusing through the port. Heparin level drawn peripherally by lab at 0428. No other interruptions noted.   3/1 AM update, HL now 0.39 after ~9 hours from restart Hgb 9.6, no bleeding noted.    Goal of Therapy:  Monitor platelets by anticoagulation protocol: Yes Heparin level 0.3-0.5 units/ml   Plan:  Continue heparin gtt at 1,300 units/hr Confirmatory Heparin level in 6 hours Monitor daily heparin level, CBC, s/s of bleed  Pressley Barsky A. Levada Dy, PharmD, [place] Please utilize Amion for appropriate phone number to reach the unit pharmacist ([place])   11/21/2018       10:22 AM

## 2018-11-22 ENCOUNTER — Encounter (HOSPITAL_COMMUNITY): Payer: Self-pay | Admitting: Vascular Surgery

## 2018-11-22 ENCOUNTER — Ambulatory Visit: Payer: Medicaid Other | Admitting: Radiation Oncology

## 2018-11-22 LAB — CBC
HCT: 27.1 % — ABNORMAL LOW (ref 39.0–52.0)
Hemoglobin: 8.8 g/dL — ABNORMAL LOW (ref 13.0–17.0)
MCH: 28.5 pg (ref 26.0–34.0)
MCHC: 32.5 g/dL (ref 30.0–36.0)
MCV: 87.7 fL (ref 80.0–100.0)
Platelets: 327 10*3/uL (ref 150–400)
RBC: 3.09 MIL/uL — ABNORMAL LOW (ref 4.22–5.81)
RDW: 15.4 % (ref 11.5–15.5)
WBC: 11.1 10*3/uL — AB (ref 4.0–10.5)
nRBC: 0 % (ref 0.0–0.2)

## 2018-11-22 LAB — BASIC METABOLIC PANEL
Anion gap: 12 (ref 5–15)
BUN: 11 mg/dL (ref 6–20)
CO2: 24 mmol/L (ref 22–32)
Calcium: 8 mg/dL — ABNORMAL LOW (ref 8.9–10.3)
Chloride: 96 mmol/L — ABNORMAL LOW (ref 98–111)
Creatinine, Ser: 0.47 mg/dL — ABNORMAL LOW (ref 0.61–1.24)
GFR calc Af Amer: >60 mL/min (ref >60)
GFR calc non Af Amer: >60 mL/min (ref >60)
Glucose, Bld: 142 mg/dL — ABNORMAL HIGH (ref 70–99)
Potassium: 3.4 mmol/L — ABNORMAL LOW (ref 3.5–5.1)
SODIUM: 132 mmol/L — AB (ref 135–145)

## 2018-11-22 LAB — GLUCOSE, CAPILLARY
GLUCOSE-CAPILLARY: 370 mg/dL — AB (ref 70–99)
Glucose-Capillary: 236 mg/dL — ABNORMAL HIGH (ref 70–99)
Glucose-Capillary: 269 mg/dL — ABNORMAL HIGH (ref 70–99)
Glucose-Capillary: 79 mg/dL (ref 70–99)

## 2018-11-22 LAB — HEPARIN LEVEL (UNFRACTIONATED): Heparin Unfractionated: 0.33 IU/mL (ref 0.30–0.70)

## 2018-11-22 MED ORDER — ENOXAPARIN SODIUM 60 MG/0.6ML ~~LOC~~ SOLN
60.0000 mg | Freq: Two times a day (BID) | SUBCUTANEOUS | Status: DC
  Filled 2018-11-22: qty 0.6

## 2018-11-22 MED ORDER — WHITE PETROLATUM EX OINT
TOPICAL_OINTMENT | CUTANEOUS | Status: AC
  Administered 2018-11-23: 01:00:00
  Filled 2018-11-22: qty 28.35

## 2018-11-22 NOTE — Progress Notes (Signed)
Physical Therapy Treatment Patient Details Name: Ryan Medina MRN: [id number] DOB: August 08, 1961 Today's Date: 11/22/2018    History of Present Illness Pt is a 58 y.o. male with DM, HTN, sCHF EF 45% and polysubstance abuse/homelessness. He presented initially with left leg pain on 12/13, found to have acute thromboembolic ischemic left leg.  He was taken emergently for embolectomy.  Post-op course complicated.  Of primary importance, found to have inoperable pancreatic cancer.  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     PT Comments    Patient seen for mobility progression. Pt is making progress toward PT goals and appreciative of care. Pt educated to continue resting with R LE in extension/elevated and to work on hamstring stretches throughout the day. Pt unable to tolerate passive stretching today. Continue to progress as tolerated.   Follow Up Recommendations  CIR     Equipment Recommendations  Other (comment)(defer to next venue)    Recommendations for Other Services Rehab consult     Precautions / Restrictions Precautions Precautions: Fall Restrictions Weight Bearing Restrictions: Yes RLE Weight Bearing: Non weight bearing    Mobility  Bed Mobility Overal bed mobility: Independent                Transfers Overall transfer level: Needs assistance Equipment used: Rolling walker (2 wheeled) Transfers: Sit to/from Stand Sit to Stand: Min guard         General transfer comment: cues for safe use of AD to stand; min guard for safety  Ambulation/Gait Ambulation/Gait assistance: Min assist Gait Distance (Feet): 6 Feet Assistive device: Rolling walker (2 wheeled) Gait Pattern/deviations: Step-to pattern     General Gait Details: steady gait; cues for posture; pt declined further ambulation due to fear of falling with RW    Stairs             Wheelchair Mobility    Modified Rankin (Stroke Patients Only)        Balance Overall balance assessment: Needs assistance Sitting-balance support: No upper extremity supported;Feet supported Sitting balance-Leahy Scale: Good     Standing balance support: Bilateral upper extremity supported;During functional activity Standing balance-Leahy Scale: Poor                              Cognition Arousal/Alertness: Awake/alert Behavior During Therapy: Anxious Overall Cognitive Status: No family/caregiver present to determine baseline cognitive functioning Area of Impairment: Safety/judgement                         Safety/Judgement: Decreased awareness of safety;Decreased awareness of deficits     General Comments: pt demonstrates anxitey with mobility and reports falling with use of RW "before" not sure of these specifics but pt educated to call for assistance      Exercises General Exercises - Lower Extremity Quad Sets: AROM;Both;10 reps Other Exercises Other Exercises: passive R hamstring stretch attempted however pt could not tolerate due to pain    General Comments General comments (skin integrity, edema, etc.): pt educated on importance of positioning R LE in extension and attempting to stretch R hamstring/strengthen quad      Pertinent Vitals/Pain Pain Assessment: Faces Faces Pain Scale: Hurts little more Pain Location: R residual limb Pain Descriptors / Indicators: Grimacing;Guarding;Sore Pain Intervention(s): Limited activity within patient's tolerance;Monitored during session;Repositioned;Patient requesting pain meds-RN notified;RN gave pain meds during session    Home Living  Prior Function            PT Goals (current goals can now be found in the care plan section) Acute Rehab PT Goals Patient Stated Goal: decrease pain Progress towards PT goals: Progressing toward goals    Frequency    Min 3X/week      PT Plan Current plan remains appropriate     Co-evaluation              AM-PAC PT "6 Clicks" Mobility   Outcome Measure  Help needed turning from your back to your side while in a flat bed without using bedrails?: None Help needed moving from lying on your back to sitting on the side of a flat bed without using bedrails?: None Help needed moving to and from a bed to a chair (including a wheelchair)?: A Little Help needed standing up from a chair using your arms (e.g., wheelchair or bedside chair)?: A Little Help needed to walk in hospital room?: A Little Help needed climbing 3-5 steps with a railing? : A Lot 6 Click Score: 19    End of Session Equipment Utilized During Treatment: Gait belt Activity Tolerance: Patient tolerated treatment well Patient left: in chair;with call bell/phone within reach;with chair alarm set Nurse Communication: Mobility status PT Visit Diagnosis: Other abnormalities of gait and mobility (R26.89);Difficulty in walking, not elsewhere classified (R26.2);Pain;Muscle weakness (generalized) (M62.81) Pain - Right/Left: Right Pain - part of body: Leg     Time: 0354-6568 PT Time Calculation (min) (ACUTE ONLY): 20 min  Charges:  $Gait Training: 8-22 mins                     Earney Navy, PTA Acute Rehabilitation Services Pager: [phone] Office: [phone]     Darliss Cheney 11/22/2018, 9:42 AM

## 2018-11-22 NOTE — Plan of Care (Signed)
  Problem: Education: Goal: Knowledge of General Education information will improve Description Including pain rating scale, medication(s)/side effects and non-pharmacologic comfort measures  Outcome: Progressing   Problem: Health Behavior/Discharge Planning: Goal: Ability to manage health-related needs will improve Outcome: Progressing   Problem: Pain Managment: Goal: General experience of comfort will improve Outcome: Progressing   Problem: Skin Integrity: Goal: Risk for impaired skin integrity will decrease Outcome: Progressing   Problem: Clinical Measurements: Goal: Ability to maintain clinical measurements within normal limits will improve Outcome: Progressing Goal: Postoperative complications will be avoided or minimized Outcome: Progressing   Problem: Skin Integrity: Goal: Demonstration of wound healing without infection will improve Outcome: Progressing   Problem: Metabolic: Goal: Ability to maintain appropriate glucose levels will improve Outcome: Progressing   Problem: Tissue Perfusion: Goal: Adequacy of tissue perfusion will improve Outcome: Progressing

## 2018-11-22 NOTE — Plan of Care (Signed)
  Problem: Education: Goal: Knowledge of General Education information will improve Description Including pain rating scale, medication(s)/side effects and non-pharmacologic comfort measures  Outcome: Progressing   Problem: Pain Managment: Goal: General experience of comfort will improve Outcome: Progressing   Problem: Skin Integrity: Goal: Risk for impaired skin integrity will decrease Outcome: Progressing   Problem: Skin Integrity: Goal: Demonstration of wound healing without infection will improve Outcome: Progressing

## 2018-11-22 NOTE — Progress Notes (Addendum)
[place] for Heparin  Indication: PE, DVT  Allergies  Allergen Reactions  . Lisinopril Anaphylaxis   Patient Measurements: Height: 5\' 11"  (180.3 cm) Weight: 123 lb 14.4 oz (56.2 kg) IBW/kg (Calculated) : 75.3 Heparin Dosing Weight: 66.7  Vital Signs: Temp: 98 F (36.7 C) (03/02 0417) Temp Source: Oral (03/02 0417) BP: 109/79 (03/02 0417) Pulse Rate: 105 (03/02 0417)  Labs: Recent Labs    11/20/18 0355 11/21/18 0428 11/21/18 0929 11/22/18 0352  HGB 10.0* 9.6*  --  8.8*  HCT 30.9* 29.0*  --  27.1*  PLT 339 315  --  327  HEPARINUNFRC 0.37 0.19* 0.39 0.33  CREATININE 0.55* 0.54*  --  0.47*   Estimated Creatinine Clearance: 81 mL/min (A) (by C-G formula based on SCr of 0.47 mg/dL (L)).  Assessment: 59 yo male acute PE diagnosed on 2/5. Has been using Lovenox and heparin. Currently on heparin gtt.   Heparin level this AM is therapeutic at 0.33 on rate of 1300 units/hr.  Hgb is trending down at 8.8. Platelets remains stable. SCr is low-stable. No overt bleeding noted.    Goal of Therapy:  Monitor platelets by anticoagulation protocol: Yes Heparin level 0.3-0.5 units/ml   Plan:  Continue heparin gtt at 1,300 units/hr Monitor daily heparin level, CBC, s/s of bleed  Sloan Leiter, PharmD, BCPS, BCCCP Clinical Pharmacist Please refer to [facility] for [place] numbers 11/22/2018       9:40 AM   Addendum: Request to change for IV Heparin to Subcutaneous Lovenox. Assessment as above.   New Plan:  Discontinue IV Heparin. Start Lovenox 60mg  subcutaneous every 12 hours - 1hr after heparin off.  Monitor CBC and renal function on therapy.  Follow-up ability to convert to oral agent.

## 2018-11-22 NOTE — Progress Notes (Signed)
PROGRESS NOTE    Ryan Medina  LGX:[id number] DOB: 1961/05/23 DOA: 09/03/2018 PCP: Patient, No Pcp Per      Brief Narrative:  Ryan Medina is a 58 y.o. M with DM, HTN, sCHF EF 45% and polysubstance abuse/homelessness who presented initially with left leg pain on 12/13, found to have acute thromboembolic ischemic left leg.  Taken emergently for embolectomy.  Post-op course complicated.  Of primary importance, found to have inoperable pancreatic cancer.  In brief, right leg progressively worse, ultimately gangrenous requiring BKA; difficult balance between -- recurrent thromboembolic events due to malignancy vs. severe and recurrent GIB from duodenal ulcer (erosion from malignancy), all punctuated by pneumonia sepsis, now resolved.   Please see separate summary by me from 2/28.      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  Scant bleedingonvernight, pain controlled with oral medications.    -PT eval -Scheduled acetaminophen, as needed oxycodone for pain    Delirium Resolved, from lorazepam. -Stop lorazepam  Gastric outlet obstruction Severe protein calorie malnutrition S/p gastrojejunostomy No change -Consult dietitian -Continue Ensure  Acute blood loss anemia on anemia of chronic disease Hematochezia Malignant duodenal ulcer Stable no bleeding -Trend Hgb three times weekly -Continue PPI and sucralfate  Pacreatic adenoCA -Consult Oncology, appreciate cares  Candida esophagitis  Pulmonary embolism Last transfusion 1 week ago, no bleeding since, Hgb remains stable. I will discuss transition to oral anticoagulant with Dr. Marin Olp today. -Continue heparin drip for now  Chronic systolic and diastolic CHF Stable  Diabetes BG normal -Continue SSI  Sepsis from HCAP, Pseudomonas UTI Treated with Cefepime 1/11-1/16, fever resolved.  Stage 2 pressure ulcer coccyx, not POA          MDM and  disposition: Below labs and imaging reports reviewed and summarized above.  Medication management as above.  The patient was admitted with an ischemic leg, found to have pancreatic cancer and hypercoagulability causing multiple complications, including a prolonged gastrointestinal hemorrhage.     His GI bleeding is essentially resolved now, and he has been stable on heparin for some days.  He finally had to undergo right leg BKA due to gangrene, and he is progressing well from this.  No change to plan.  Provided that his leg is okay, and vascular surgery signs off, will continue to work towards disposition and rehab and transition to an oral anticoagulant, so that he can progress towards initiating palliative chemotherapy.          DVT prophylaxis: N/A on heparin Code Status: FULL Family Communication: None present     Procedures:   See separate documentation      Subjective: No rectal bleeding, hematochezia or melena.  Appetite good.  Pain in leg improved.  Mild bleeding from wound, self-limited.  No cough, chest pain, dyspnea.        Objective: Vitals:   11/21/18 1747 11/21/18 2019 11/21/18 2200 11/22/18 0417  BP: 124/90 (!) 138/106 (!) 152/67 109/79  Pulse: (!) 112 (!) 122 82 (!) 105  Resp:  14  18  Temp:  98.5 F (36.9 C)  98 F (36.7 C)  TempSrc:  Oral  Oral  SpO2:  100%  100%  Weight:      Height:        Intake/Output Summary (Last 24 hours) at 11/22/2018 1046 Last data filed at 11/22/2018 0900 Gross per 24 hour  Intake 1441.69 ml  Output 601 ml  Net 840.69 ml  Filed Weights   11/02/18 1806 11/04/18 0831 11/12/18 0742  Weight: 56.2 kg 56.2 kg 56.2 kg    Examination: General appearance: Cachectic adult male, lying in bed, no acute distress HEENT: Anicteric, conjunctival pink, lids and lashes normal.  No nasal deformity, discharge, epistaxis.  Lips moist, dentition normal, oropharynx moist, no oral lesions, hearing normal Skin: Warm and dry.  No  suspicious rashes or lesions. Cardiac: Tachycardic, regular, no murmurs, no lower extremity edema Respiratory: Respiratory rate and rhythm is normal, lungs clear without rales or wheezes Abdomen: Abdomen incision clean dry and intact, abdomen soft, no distention, hepatosplenomegaly, tenderness palpation, rigidity. MSK: Diffuse loss of subcutaneous muscle mass and fat, cachectic, temporal wasting.  Right BKA, dressing in place. Neuro: Awake and alert, extraocular movements intact, moves all extremities, speech fluent  psych: Sensorium intact and responding to questions, attention normal, judgment insight appear normal.     Data Reviewed: I have personally reviewed following labs and imaging studies:  CBC: Recent Labs  Lab 11/19/18 0401 11/19/18 1148 11/20/18 0355 11/21/18 0428 11/22/18 0352  WBC 7.6 7.2 8.7 11.1* 11.1*  HGB 10.9* 10.7* 10.0* 9.6* 8.8*  HCT 33.1* 32.9* 30.9* 29.0* 27.1*  MCV 89.2 88.9 89.6 86.3 87.7  PLT 365 359 339 315 789   Basic Metabolic Panel: Recent Labs  Lab 11/16/18 0109 11/17/18 0408 11/19/18 1148 11/20/18 0355 11/21/18 0428 11/22/18 0352  NA 136 136  --  131* 133* 132*  K 4.0 3.8  --  4.0 3.7 3.4*  CL 101 103  --  96* 96* 96*  CO2 27 26  --  27 28 24   GLUCOSE 154* 254*  --  276* 161* 142*  BUN 11 16  --  7 8 11   CREATININE 0.48* 0.57* 0.49* 0.55* 0.54* 0.47*  CALCIUM 8.4* 8.4*  --  8.0* 8.3* 8.0*   GFR: Estimated Creatinine Clearance: 81 mL/min (A) (by C-G formula based on SCr of 0.47 mg/dL (L)). Liver Function Tests: No results for input(s): AST, ALT, ALKPHOS, BILITOT, PROT, ALBUMIN in the last 168 hours. No results for input(s): LIPASE, AMYLASE in the last 168 hours. No results for input(s): AMMONIA in the last 168 hours. Coagulation Profile: No results for input(s): INR, PROTIME in the last 168 hours. Cardiac Enzymes: No results for input(s): CKTOTAL, CKMB, CKMBINDEX, TROPONINI in the last 168 hours. BNP (last 3 results) No results  for input(s): PROBNP in the last 8760 hours. HbA1C: No results for input(s): HGBA1C in the last 72 hours. CBG: Recent Labs  Lab 11/21/18 1007 11/21/18 1334 11/21/18 1720 11/21/18 2148 11/22/18 0755  GLUCAP 203* 178* 142* 260* 370*   Lipid Profile: No results for input(s): CHOL, HDL, LDLCALC, TRIG, CHOLHDL, LDLDIRECT in the last 72 hours. Thyroid Function Tests: No results for input(s): TSH, T4TOTAL, FREET4, T3FREE, THYROIDAB in the last 72 hours. Anemia Panel: No results for input(s): VITAMINB12, FOLATE, FERRITIN, TIBC, IRON, RETICCTPCT in the last 72 hours. Urine analysis:    Component Value Date/Time   COLORURINE YELLOW 10/10/2018 2303   APPEARANCEUR CLEAR 10/10/2018 2303   LABSPEC 1.022 10/10/2018 2303   PHURINE 6.0 10/10/2018 2303   GLUCOSEU >=500 (A) 10/10/2018 2303   HGBUR NEGATIVE 10/10/2018 2303   BILIRUBINUR NEGATIVE 10/10/2018 2303   KETONESUR NEGATIVE 10/10/2018 2303   PROTEINUR NEGATIVE 10/10/2018 2303   UROBILINOGEN 4.0 (H) 12/05/2016 0921   NITRITE NEGATIVE 10/10/2018 2303   LEUKOCYTESUR NEGATIVE 10/10/2018 2303   Sepsis Labs: @LABRCNTIP (procalcitonin:4,lacticacidven:4)  ) Recent Results (from the past 240 hour(s))  Surgical pcr screen     Status: None   Collection Time: 11/18/18  2:58 PM  Result Value Ref Range Status   MRSA, PCR NEGATIVE NEGATIVE Final   Staphylococcus aureus NEGATIVE NEGATIVE Final    Comment: (NOTE) The Xpert SA Assay (FDA approved for NASAL specimens in patients 22 years of age and older), is one component of a comprehensive surveillance program. It is not intended to diagnose infection nor to guide or monitor treatment. Performed at [facility], [place] [address] [place], [place] [address]          Radiology Studies: No results found.      Scheduled Meds: . acetaminophen  1,000 mg Oral Q8H  . docusate sodium  100 mg Oral Daily  . feeding supplement (ENSURE ENLIVE)  237 mL Oral TID BM  . insulin aspart  0-5  Units Subcutaneous QHS  . insulin aspart  0-9 Units Subcutaneous TID WC  . pantoprazole  40 mg Oral BID  . polyethylene glycol  17 g Oral Daily  . sodium chloride flush  10-40 mL Intracatheter Q12H  . sucralfate  1 g Oral TID WC & HS   Continuous Infusions: . heparin 1,300 Units/hr (11/21/18 1746)  . magnesium sulfate 1 - 4 g bolus IVPB       LOS: 80 days    Time spent: 25 minutes    Edwin Dada, MD [facility] 11/22/2018, 10:46 AM     Please page through [place]:  [email or web address] Password [email or web address] If 7PM-7AM, please contact night-coverage

## 2018-11-22 NOTE — Progress Notes (Signed)
   VASCULAR SURGERY ASSESSMENT & PLAN:   3 Days Post-Op s/p: Right below the knee amputation.  His amputation site looks fine.  He is however bending his knee.  I have explained to him the importance of keeping the knee straight so that he does not get a pressure sore.  In addition if he gets a contracture he would not be a candidate for a prosthesis.  I have called [employer] and asked him to get him a knee immobilizer.   SUBJECTIVE:   No complaints this morning.  PHYSICAL EXAM:   Vitals:   11/21/18 1747 11/21/18 2019 11/21/18 2200 11/22/18 0417  BP: 124/90 (!) 138/106 (!) 152/67 109/79  Pulse: (!) 112 (!) 122 82 (!) 105  Resp:  14  18  Temp:  98.5 F (36.9 C)  98 F (36.7 C)  TempSrc:  Oral  Oral  SpO2:  100%  100%  Weight:      Height:       I examined his right below the knee amputation and this is healing well.  He does have some ecchymosis on the anterior aspect that we will keep an eye on.  LABS:   Lab Results  Component Value Date   WBC 11.1 (H) 11/22/2018   HGB 8.8 (L) 11/22/2018   HCT 27.1 (L) 11/22/2018   MCV 87.7 11/22/2018   PLT 327 11/22/2018   Lab Results  Component Value Date   CREATININE 0.47 (L) 11/22/2018   Lab Results  Component Value Date   INR 1.31 11/02/2018   CBG (last 3)  Recent Labs    11/21/18 2148 11/22/18 0755 11/22/18 1133  GLUCAP 260* 370* 79    PROBLEM LIST:    Principal Problem:   Popliteal artery embolus (HCC) Active Problems:   DM (diabetes mellitus), type 2, uncontrolled (HCC)   Essential hypertension   Dilated cardiomyopathy (HCC)   Chest pain   AAA (abdominal aortic aneurysm) (HCC)   Ischemic foot   Right leg claudication (HCC)   PVD (peripheral vascular disease) ([place])   Hemorrhagic shock ([place])   Nontraumatic ischemic infarction of muscle of right lower leg   Upper GI bleed   Cocaine abuse ([place])   Acute blood loss anemia   Chronic pain syndrome   Duodenal ulcer with hemorrhage   Pancreatic cancer ([place])  Goals of care, counseling/discussion   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 acetaminophen  1,000 mg Oral Q8H  . docusate sodium  100 mg Oral Daily  . feeding supplement (ENSURE ENLIVE)  237 mL Oral TID BM  . insulin aspart  0-5 Units Subcutaneous QHS  . insulin aspart  0-9 Units Subcutaneous TID WC  . pantoprazole  40 mg Oral BID  . polyethylene glycol  17 g Oral Daily  . sodium chloride flush  10-40 mL Intracatheter Q12H  . sucralfate  1 g Oral TID WC & HS    [name] Beeper: [phone] Office: [phone] 11/22/2018

## 2018-11-22 NOTE — Progress Notes (Signed)
IP rehab admissions - I met with patient.  He has no place to go after a potential inpatient rehab stay.  He cannot go to his sister's due to over crowding.  His brother stays with someone else.  His girlfriend is in rehab.  He tells me that if he went home today, he would be on the street.  I spoke with case manager, Manuela Schwartz, and the best plan for him is SNF placement to allow sufficient time for healing and future palliative chemotherapy.  Call me for questions.  [phone]

## 2018-11-23 DIAGNOSIS — R531 Weakness: Secondary | ICD-10-CM

## 2018-11-23 DIAGNOSIS — Z590 Homelessness: Secondary | ICD-10-CM

## 2018-11-23 LAB — CBC WITH DIFFERENTIAL/PLATELET
Abs Immature Granulocytes: 0.05 10*3/uL (ref 0.00–0.07)
BASOS PCT: 0 %
Basophils Absolute: 0 10*3/uL (ref 0.0–0.1)
Eosinophils Absolute: 0 10*3/uL (ref 0.0–0.5)
Eosinophils Relative: 0 %
HCT: 24.4 % — ABNORMAL LOW (ref 39.0–52.0)
Hemoglobin: 7.7 g/dL — ABNORMAL LOW (ref 13.0–17.0)
Immature Granulocytes: 0 %
Lymphocytes Relative: 6 %
Lymphs Abs: 0.7 10*3/uL (ref 0.7–4.0)
MCH: 28.6 pg (ref 26.0–34.0)
MCHC: 31.6 g/dL (ref 30.0–36.0)
MCV: 90.7 fL (ref 80.0–100.0)
Monocytes Absolute: 1.1 10*3/uL — ABNORMAL HIGH (ref 0.1–1.0)
Monocytes Relative: 9 %
NRBC: 0 % (ref 0.0–0.2)
Neutro Abs: 10.5 10*3/uL — ABNORMAL HIGH (ref 1.7–7.7)
Neutrophils Relative %: 85 %
PLATELETS: 246 10*3/uL (ref 150–400)
RBC: 2.69 MIL/uL — ABNORMAL LOW (ref 4.22–5.81)
RDW: 15.4 % (ref 11.5–15.5)
WBC: 12.4 10*3/uL — ABNORMAL HIGH (ref 4.0–10.5)

## 2018-11-23 LAB — CBC
HEMATOCRIT: 25.7 % — AB (ref 39.0–52.0)
Hemoglobin: 8.1 g/dL — ABNORMAL LOW (ref 13.0–17.0)
MCH: 28.5 pg (ref 26.0–34.0)
MCHC: 31.5 g/dL (ref 30.0–36.0)
MCV: 90.5 fL (ref 80.0–100.0)
Platelets: 285 10*3/uL (ref 150–400)
RBC: 2.84 MIL/uL — ABNORMAL LOW (ref 4.22–5.81)
RDW: 15.3 % (ref 11.5–15.5)
WBC: 13 10*3/uL — AB (ref 4.0–10.5)
nRBC: 0 % (ref 0.0–0.2)

## 2018-11-23 LAB — COMPREHENSIVE METABOLIC PANEL
ALBUMIN: 1.8 g/dL — AB (ref 3.5–5.0)
ALT: 29 U/L (ref 0–44)
AST: 28 U/L (ref 15–41)
Alkaline Phosphatase: 84 U/L (ref 38–126)
Anion gap: 5 (ref 5–15)
BUN: 14 mg/dL (ref 6–20)
CO2: 30 mmol/L (ref 22–32)
Calcium: 8.4 mg/dL — ABNORMAL LOW (ref 8.9–10.3)
Chloride: 99 mmol/L (ref 98–111)
Creatinine, Ser: 0.4 mg/dL — ABNORMAL LOW (ref 0.61–1.24)
GFR calc Af Amer: >60 mL/min (ref >60)
GFR calc non Af Amer: >60 mL/min (ref >60)
Glucose, Bld: 209 mg/dL — ABNORMAL HIGH (ref 70–99)
Potassium: 3.4 mmol/L — ABNORMAL LOW (ref 3.5–5.1)
Sodium: 134 mmol/L — ABNORMAL LOW (ref 135–145)
Total Bilirubin: 0.5 mg/dL (ref 0.3–1.2)
Total Protein: 6.5 g/dL (ref 6.5–8.1)

## 2018-11-23 LAB — GLUCOSE, CAPILLARY
Glucose-Capillary: 270 mg/dL — ABNORMAL HIGH (ref 70–99)
Glucose-Capillary: 186 mg/dL — ABNORMAL HIGH (ref 70–99)
Glucose-Capillary: 249 mg/dL — ABNORMAL HIGH (ref 70–99)
Glucose-Capillary: 157 mg/dL — ABNORMAL HIGH (ref 70–99)

## 2018-11-23 LAB — IRON AND TIBC
Iron: 12 ug/dL — ABNORMAL LOW (ref 45–182)
SATURATION RATIOS: 7 % — AB (ref 17.9–39.5)
TIBC: 175 ug/dL — ABNORMAL LOW (ref 250–450)
UIBC: 163 ug/dL

## 2018-11-23 LAB — FERRITIN: Ferritin: 1147 ng/mL — ABNORMAL HIGH (ref 24–336)

## 2018-11-23 MED ORDER — WHITE PETROLATUM EX OINT
TOPICAL_OINTMENT | CUTANEOUS | Status: AC
  Administered 2018-11-23: 13:00:00
  Filled 2018-11-23: qty 28.35

## 2018-11-23 MED ORDER — SODIUM CHLORIDE 0.9 % IV SOLN
510.0000 mg | Freq: Once | INTRAVENOUS | Status: AC
  Administered 2018-11-23: 510 mg via INTRAVENOUS
  Filled 2018-11-23: qty 17

## 2018-11-23 MED ORDER — APIXABAN 5 MG PO TABS
10.0000 mg | ORAL_TABLET | Freq: Two times a day (BID) | ORAL | Status: DC
  Administered 2018-11-23 – 2018-11-24 (×3): 10 mg via ORAL
  Filled 2018-11-23 (×4): qty 2

## 2018-11-23 MED ORDER — APIXABAN 5 MG PO TABS: 5.0000 mg | ORAL_TABLET | Freq: Two times a day (BID) | ORAL | Status: DC

## 2018-11-23 NOTE — Progress Notes (Signed)
Inpatient Diabetes Program Recommendations  AACE/ADA: New Consensus Statement on Inpatient Glycemic Control (2015)  Target Ranges:  Prepandial:   less than 140 mg/dL      Peak postprandial:   less than 180 mg/dL (1-2 hours)      Critically ill patients:  140 - 180 mg/dL   Lab Results  Component Value Date   GLUCAP 186 (H) 11/23/2018   HGBA1C 18.2 (H) 08/23/2018    Review of Glycemic Control Results for Ryan Medina, Ryan Medina (MRN [id number]) as of 11/23/2018 15:17  Ref. Range 11/22/2018 16:47 11/22/2018 23:35 11/23/2018 07:45 11/23/2018 11:58  Glucose-Capillary Latest Ref Range: 70 - 99 mg/dL 236 (H) 269 (H) 270 (H) 186 (H)   Diabetes history:DM 2 Outpatient Diabetes medications: Levemir 30 units q AM and 20 units q PM, Novolog 0-15 units tid with meals, Metformin 1000 mg bid Current orders for Inpatient glycemic control: Novolog sensitive tid with meals and 0-5 units QHS  Inpatient Diabetes Program Recommendations:  Noted Lantus has been previously discontinued to hypoglycemia, however trends have increased over course of last few days.  If FSBS continues to exceed 180 mg/dL, consider adding Levemir 4 units QHS.  Of note, patient was on Levemir 4 units while inpatient at [facility] from 1/30-2/16 was on nutritional supplements and eating meals and was trending well.   Thanks, Bronson Curb, MSN, RNC-OB Diabetes Coordinator [phone] (8a-5p)

## 2018-11-23 NOTE — Progress Notes (Signed)
PROGRESS NOTE    Ryan Medina  POE:[id number] DOB: 28-Apr-1961 DOA: 09/03/2018 PCP: Patient, No Pcp Per      Brief Narrative:  Ryan Medina is a 58 y.o. M with DM, HTN, sCHF EF 45% and polysubstance abuse/homelessness who presented initially with left leg pain on 12/13, found to have acute thromboembolic ischemic left leg.  Taken emergently for embolectomy.  Post-op course complicated.  Of primary importance, found to have inoperable pancreatic cancer.  In brief, right leg progressively worse, ultimately gangrenous requiring BKA; difficult balance between -- recurrent thromboembolic events due to malignancy vs. severe and recurrent GIB from duodenal ulcer (erosion from malignancy), all punctuated by pneumonia sepsis, now resolved.      Please see separate summary by me from 2/28.      Assessment & Plan:  Right leg gangrene S/p embolectomy x2 S/p BKA 2/28 Stable    -PT eval -Scheduled acetaminophen, as needed oxycodone for pain       Gastric outlet obstruction Severe protein calorie malnutrition S/p gastrojejunostomy No change -Consult dietitian -Continue Ensure  Acute blood loss anemia on anemia of chronic disease Hematochezia Malignant duodenal ulcer Anemia of iron deficiency Hgb trending down.  Likely this is iron deficiency superimposed on marrow loss (amputation) and previous blood loss. -Feraheme today, start oral iron  -Continue PPI and sucralfate  Pacreatic adenoCA -Consult Oncology, appreciate cares  Candida esophagitis  Pulmonary embolism GI bleeding appears stopped.  Patient refused Lovenox.  Discussed with Dr. Marin Medina and patient, Eliquis is a suboptimal alternative, but patient will accept. -Start Eliquis   Chronic systolic and diastolic CHF Stable  Diabetes Hyperglycemic -Start Levemir 4 QHS -Continue SSI  Sepsis from HCAP, Pseudomonas UTI Treated with Cefepime 1/11-1/16, fever resolved.  Stage 2 pressure ulcer coccyx, not  POA          MDM and disposition: The below labs and imaging reports were reviewed and summarized above.  Medication management as above. Including anticoagulation.  The patient was admitted with an ischemic leg, found to have pancreatic cancer and hypercoagulability causing multiple complications, including a prolonged gastrointestinal hemorrhage.     His GI bleeding is essentially resolved now, and he has been stable on heparin for some days.  He finally had to undergo right leg BKA due to gangrene, and he is progressing well from this.  The patient at this point in time will need rehabilitation from his right BKA as well as malnutrition, as well as frequent lab monitoring.  With his new BKA and mobility limitations, we expect he will be an excellent rehabilitation candidate, and he is highly motivated to rehabilitate, in order to obtain palliative chemotherapy.            DVT prophylaxis: N/A  On Eliquis Code Status: FULL Family Communication: None present     Procedures:   See separate documentation      Subjective: No rectal bleeding, hematochezia, melena.  Appetite good.  Pain in leg improved.  Mild bleeding from wound, self-limited.  No cough, chest pain, dyspnea.       Objective: Vitals:   11/22/18 1354 11/22/18 2101 11/23/18 0448 11/23/18 1344  BP: 120/88 121/88 122/85 115/85  Pulse: (!) 110 (!) 115 (!) 111 (!) 107  Resp: 16 18 20    Temp: 98.3 F (36.8 C) 99.6 F (37.6 C) 98.3 F (36.8 C) 97.9 F (36.6 C)  TempSrc: Oral Oral Oral Oral  SpO2: 100% 100% 100% 100%  Weight:      Height:  Intake/Output Summary (Last 24 hours) at 11/23/2018 1622 Last data filed at 11/23/2018 1550 Gross per 24 hour  Intake 250 ml  Output -  Net 250 ml   Filed Weights   11/02/18 1806 11/04/18 0831 11/12/18 0742  Weight: 56.2 kg 56.2 kg 56.2 kg    Examination: General appearance: Cachectic adult male, lying in bed, no acute distress HEENT: Anicteric,  conjunctival pink, lids and lashes normal.  No nasal deformity, discharge, epistaxis.  Lips moist, dentition normal, oropharynx moist, no oral lesions, hearing normal Skin: Warm and dry.  No suspicious rashes or lesions. Cardiac: Tachycardic, regular, no lower extremity edema Respiratory: Respiratory rate and rhythm is normal, lungs clear without rales or wheezes Abdomen: Abdomen incision clean dry and intact.   MSK: Diffuse loss of subcutaneous muscle mass and fat, cachectic, temporal wasting.  Right BKA, dressing in place. Neuro: Sleepy but awake, extraocular movements intact, moves all extremities, speech fluent. psych: Sensorium intact responding to questions, attention normal, judgment insight normal.        Data Reviewed: I have personally reviewed following labs and imaging studies:  CBC: Recent Labs  Lab 11/20/18 0355 11/21/18 0428 11/22/18 0352 11/23/18 0447 11/23/18 1042  WBC 8.7 11.1* 11.1* 13.0* 12.4*  NEUTROABS  --   --   --   --  10.5*  HGB 10.0* 9.6* 8.8* 8.1* 7.7*  HCT 30.9* 29.0* 27.1* 25.7* 24.4*  MCV 89.6 86.3 87.7 90.5 90.7  PLT 339 315 327 285 885   Basic Metabolic Panel: Recent Labs  Lab 11/17/18 0408 11/19/18 1148 11/20/18 0355 11/21/18 0428 11/22/18 0352 11/23/18 0447  NA 136  --  131* 133* 132* 134*  K 3.8  --  4.0 3.7 3.4* 3.4*  CL 103  --  96* 96* 96* 99  CO2 26  --  27 28 24 30   GLUCOSE 254*  --  276* 161* 142* 209*  BUN 16  --  7 8 11 14   CREATININE 0.57* 0.49* 0.55* 0.54* 0.47* 0.40*  CALCIUM 8.4*  --  8.0* 8.3* 8.0* 8.4*   GFR: Estimated Creatinine Clearance: 81 mL/min (A) (by C-G formula based on SCr of 0.4 mg/dL (L)). Liver Function Tests: Recent Labs  Lab 11/23/18 0447  AST 28  ALT 29  ALKPHOS 84  BILITOT 0.5  PROT 6.5  ALBUMIN 1.8*   No results for input(s): LIPASE, AMYLASE in the last 168 hours. No results for input(s): AMMONIA in the last 168 hours. Coagulation Profile: No results for input(s): INR, PROTIME in the  last 168 hours. Cardiac Enzymes: No results for input(s): CKTOTAL, CKMB, CKMBINDEX, TROPONINI in the last 168 hours. BNP (last 3 results) No results for input(s): PROBNP in the last 8760 hours. HbA1C: No results for input(s): HGBA1C in the last 72 hours. CBG: Recent Labs  Lab 11/22/18 1133 11/22/18 1647 11/22/18 2335 11/23/18 0745 11/23/18 1158  GLUCAP 79 236* 269* 270* 186*   Lipid Profile: No results for input(s): CHOL, HDL, LDLCALC, TRIG, CHOLHDL, LDLDIRECT in the last 72 hours. Thyroid Function Tests: No results for input(s): TSH, T4TOTAL, FREET4, T3FREE, THYROIDAB in the last 72 hours. Anemia Panel: Recent Labs    11/23/18 0447  FERRITIN 1,147*  TIBC 175*  IRON 12*   Urine analysis:    Component Value Date/Time   COLORURINE YELLOW 10/10/2018 [place] 10/10/2018 2303   LABSPEC 1.022 10/10/2018 2303   PHURINE 6.0 10/10/2018 2303   GLUCOSEU >=500 (A) 10/10/2018 2303   HGBUR NEGATIVE 10/10/2018 2303  BILIRUBINUR NEGATIVE 10/10/2018 [place] 10/10/2018 2303   PROTEINUR NEGATIVE 10/10/2018 2303   UROBILINOGEN 4.0 (H) 12/05/2016 0921   NITRITE NEGATIVE 10/10/2018 2303   LEUKOCYTESUR NEGATIVE 10/10/2018 2303   Sepsis Labs: @LABRCNTIP (procalcitonin:4,lacticacidven:4)  ) Recent Results (from the past 240 hour(s))  Surgical pcr screen     Status: None   Collection Time: 11/18/18  2:58 PM  Result Value Ref Range Status   MRSA, PCR NEGATIVE NEGATIVE Final   Staphylococcus aureus NEGATIVE NEGATIVE Final    Comment: (NOTE) The Xpert SA Assay (FDA approved for NASAL specimens in patients 45 years of age and older), is one component of a comprehensive surveillance program. It is not intended to diagnose infection nor to guide or monitor treatment. Performed at [facility], [place] [address] [place], [place] [address]          Radiology Studies: No results found.      Scheduled Meds: . acetaminophen  1,000 mg Oral  Q8H  . apixaban  10 mg Oral BID   Followed by  . [START ON 11/30/2018] apixaban  5 mg Oral BID  . docusate sodium  100 mg Oral Daily  . feeding supplement (ENSURE ENLIVE)  237 mL Oral TID BM  . insulin aspart  0-5 Units Subcutaneous QHS  . insulin aspart  0-9 Units Subcutaneous TID WC  . pantoprazole  40 mg Oral BID  . polyethylene glycol  17 g Oral Daily  . sodium chloride flush  10-40 mL Intracatheter Q12H  . sucralfate  1 g Oral TID WC & HS   Continuous Infusions: . magnesium sulfate 1 - 4 g bolus IVPB       LOS: 81 days    Time spent: 35 minutes    Edwin Dada, MD [facility] 11/23/2018, 4:22 PM     Please page through [place]:  [email or web address] Password [email or web address] If 7PM-7AM, please contact night-coverage

## 2018-11-23 NOTE — Progress Notes (Addendum)
Nutrition Follow-up  DOCUMENTATION CODES:   Severe malnutrition in context of chronic illness, Underweight  INTERVENTION:    Continue to offer Ensure Enlive PO TID, each supplement provides 350 kcal and 20 grams of protein  Continue Magic cup BID with meals, each supplement provides 290 kcal and 9 grams of protein  NUTRITION DIAGNOSIS:   Severe Malnutrition related to chronic illness(Pancreatic cancer, DM and CHF) as evidenced by severe muscle depletion, severe fat depletion, percent weight loss.  Ongoing   GOAL:   Patient will meet greater than or equal to 90% of their needs  Met  MONITOR:   PO intake, Supplement acceptance, Labs, Weight trends, I & O's(TPN)  ASSESSMENT:   58 y.o male with PMH: recent dx pancreatic cancer, DM, CHF, crack cocaine use, emboli of legs, smoker. Pt admitted with DKA and GI bleed. 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 Received TPN from 2/14 to 2/23.  Patient transferred to [facility] on 2/27 for surgery. 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 Healing well per review of progress notes. Patient cannot go to CIR, so plans for d/c to SNF for rehab to get him ready for palliative chemotherapy.   Patient reports good appetite; on a heart healthy diet since 2/28. He is consuming 75-100% of meals, mostly 100%. He is being offered Ensure TID, but only drinking 1-2 per day; some days he does not drink any Ensure. Also receiving magic cup BID with meals.   Labs reviewed. Sodium 134 (L), potassium 3.4 (L) Medications reviewed and include novolog, carafate, miralax, magnesium sulfate.    Diet Order:   Diet Order            Diet Heart Room service appropriate? Yes; Fluid consistency: Thin  Diet effective now              EDUCATION NEEDS:   Education needs have been addressed  Skin:  Skin Assessment: Skin Integrity Issues: Skin Integrity Issues:: Stage II, Other (Comment) Stage II: coccyx Other: R leg BKA  Last BM:  3/2  Height:    Ht Readings from Last 1 Encounters:  11/12/18 5' 11" (1.803 m)    Weight:   Wt Readings from Last 1 Encounters:  11/12/18 56.2 kg  Prior to [place] 2/28, no weight since BKA  Ideal Body Weight:  73.1 kg  Estimated Nutritional Needs:   Kcal:  2000-2200 kcal  Protein:  100-110 grams  Fluid:  >/= 2L/day    Molli Barrows, RD, LDN, [place] Pager [phone] After Hours Pager [phone]

## 2018-11-23 NOTE — Progress Notes (Signed)
Physical Therapy Treatment Patient Details Name: Ryan Medina MRN: [id number] DOB: [birth year] Today's Date: 11/23/2018    History of Present Illness Pt is a 58 y.o. male with DM, HTN, sCHF EF 45% and polysubstance abuse/homelessness. He presented initially with left leg pain on 12/13, found to have acute thromboembolic ischemic left leg.  He was taken emergently for embolectomy.  Post-op course complicated.  Of primary importance, found to have inoperable pancreatic cancer.  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     PT Comments    Patient seen for mobility progression.  Pt requires min guard for sit to stand transfers and min A for short distance gait training in room. Gait distance limited by increased R LE pain in dependent position. The majority of this session focused on R LE stretching/positioning to promote R knee extension as pt maintains knee flexion at all times. With passive stretching pt reports pain in the R knee joint and not muscle pain and there is a hard end feel however slightly increased knee extension was attained and pt encouraged to maintain positioning despite increased pain. Recommending SNF for further skilled PT services to maximize independence and safety with mobility.    Follow Up Recommendations  SNF     Equipment Recommendations  Other (comment)(defer to next venue)    Recommendations for Other Services       Precautions / Restrictions Precautions Precautions: Fall Restrictions Weight Bearing Restrictions: Yes RLE Weight Bearing: Non weight bearing    Mobility  Bed Mobility Overal bed mobility: Independent                Transfers Overall transfer level: Needs assistance Equipment used: Rolling walker (2 wheeled) Transfers: Sit to/from Stand Sit to Stand: Min guard         General transfer comment: min guard for safety; pt demonstrates safe hand  placement  Ambulation/Gait Ambulation/Gait assistance: Min assist Gait Distance (Feet): 8 Feet Assistive device: Rolling walker (2 wheeled) Gait Pattern/deviations: Step-to pattern     General Gait Details: limited by increased pain with R LE in dependent position   Stairs             Wheelchair Mobility    Modified Rankin (Stroke Patients Only)       Balance Overall balance assessment: Needs assistance Sitting-balance support: No upper extremity supported;Feet supported Sitting balance-Leahy Scale: Good     Standing balance support: Bilateral upper extremity supported;During functional activity Standing balance-Leahy Scale: Poor                              Cognition Arousal/Alertness: Awake/alert Behavior During Therapy: WFL for tasks assessed/performed Overall Cognitive Status: Within Functional Limits for tasks assessed                                        Exercises Amputee Exercises Quad Sets: AROM;Right;10 reps Knee Extension: AROM;Right;10 reps Straight Leg Raises: AROM;Right;10 reps Other Exercises Other Exercises: passive R LE stretches to promote increased knee extension    General Comments        Pertinent Vitals/Pain Pain Assessment: Faces Faces Pain Scale: Hurts even more Pain Location: R residual limb Pain Descriptors / Indicators: Grimacing;Guarding;Sore;Aching Pain Intervention(s): Limited activity within patient's tolerance;Monitored during session;Premedicated before session;Repositioned    Home Living  Prior Function            PT Goals (current goals can now be found in the care plan section) Acute Rehab PT Goals Patient Stated Goal: decrease pain Progress towards PT goals: Progressing toward goals    Frequency    Min 3X/week      PT Plan Discharge plan needs to be updated    Co-evaluation              AM-PAC PT "6 Clicks" Mobility   Outcome  Measure  Help needed turning from your back to your side while in a flat bed without using bedrails?: None Help needed moving from lying on your back to sitting on the side of a flat bed without using bedrails?: None Help needed moving to and from a bed to a chair (including a wheelchair)?: A Little Help needed standing up from a chair using your arms (e.g., wheelchair or bedside chair)?: A Little Help needed to walk in hospital room?: A Little Help needed climbing 3-5 steps with a railing? : A Lot 6 Click Score: 19    End of Session Equipment Utilized During Treatment: Gait belt Activity Tolerance: Patient tolerated treatment well Patient left: in chair;with call bell/phone within reach Nurse Communication: Mobility status PT Visit Diagnosis: Other abnormalities of gait and mobility (R26.89);Difficulty in walking, not elsewhere classified (R26.2);Pain;Muscle weakness (generalized) (M62.81) Pain - Right/Left: Right Pain - part of body: Leg     Time: 5456-2563 PT Time Calculation (min) (ACUTE ONLY): 21 min  Charges:  $Therapeutic Exercise: 8-22 mins                     Earney Navy, PTA Acute Rehabilitation Services Pager: [phone] Office: [phone]     Darliss Cheney 11/23/2018, 11:56 AM

## 2018-11-23 NOTE — Plan of Care (Signed)
  Problem: Pain Managment: Goal: General experience of comfort will improve Outcome: Progressing   Problem: Skin Integrity: Goal: Risk for impaired skin integrity will decrease Outcome: Progressing   Problem: Clinical Measurements: Goal: Ability to maintain clinical measurements within normal limits will improve Outcome: Progressing   Problem: Skin Integrity: Goal: Demonstration of wound healing without infection will improve Outcome: Progressing

## 2018-11-23 NOTE — Progress Notes (Signed)
Unfortunately, the problem now is that Ryan Medina has nowhere to go once he leaves the hospital.  He cannot go to inpatient rehab.  This really has become a huge problem for him.  It sounds like there is not can be many options that we are going to have with him.  It is possible that we may not even be able to treat him.  I think that it might be a lot easier for him to go on to oral anticoagulation therapy.  I would consider Eliquis for him.  That should be reversible pretty easily if there are problems with bleeding.  He does have marked iron deficiency anemia.  I think his iron saturation is only 7%.  The ferritin is elevated because of his cancer but also because of his vascular issues.  At least, he is able to eat.  He is having no problems with eating.  There is been no problems with nausea or vomiting.  As far as he can tell, there is no melena or bright red blood per rectum.  He had a right below knee amputation.  He is getting impatient rehab for this on the floor.  His blood sugars are on the higher side but I really do not think this is much of an issue.  His hemoglobin is gone down little bit.  It is 8.1.  I think this is going to have to be watched closely.  He may need to be transfused if his hemoglobin keeps going down.  I just am not sure how we will go to be able to treat his pancreatic cancer.  This is a very tough problem for [place].  I really just want his quality of life to be as good as possible.  It is obvious that he has had a very difficult life.  He has been in the hospital now for over 3 months.  At least in the hospital, has been able to have meals.  He is been able to shower.  He has been able to have heat and a clean bed to sleep in.  I very much appreciate everybody's help with his care.  I know this is been incredibly difficult because of his lack of living at a residence when he came in here.  [name], MD  Oswaldo Milian 41:10

## 2018-11-23 NOTE — Progress Notes (Signed)
Occupational Therapy Treatment Patient Details Name: Ryan Medina MRN: [id number] DOB: 10-07-1960 Today's Date: 11/23/2018    History of present illness Pt is a 58 y.o. male with DM, HTN, sCHF EF 45% and polysubstance abuse/homelessness. He presented initially with left leg pain on 12/13, found to have acute thromboembolic ischemic left leg.  He was taken emergently for embolectomy.  Post-op course complicated.  Of primary importance, found to have inoperable pancreatic cancer.  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    OT comments  Pt supine and resting to sleep at the end of session. RN notified and sign placed on the door to decr staff from entering during time of rest.    Follow Up Recommendations       Equipment Recommendations  3 in 1 bedside commode    Recommendations for Other Services      Precautions / Restrictions Precautions Precautions: Fall Precaution Comments: Pt reports fall overnight 2/28 trying to use urinal. Restrictions RLE Weight Bearing: Non weight bearing       Mobility Bed Mobility Overal bed mobility: Independent             General bed mobility comments: pillow positioned under R BKA to help promote extension and elevation. pt with a knee flex position  Transfers                 General transfer comment: declined OOB or further work reporting fatigue    Balance Overall balance assessment: Needs assistance   Sitting balance-Leahy Scale: Good                                     ADL either performed or assessed with clinical judgement   ADL                                         General ADL Comments: Pt helped to position in the bed and remove BSC to decr fall risk. RN notified that [facility] placed in bathroom to decr fall risk with bed alarm set     Vision       Perception     Praxis      Cognition Arousal/Alertness: Awake/alert Behavior During  Therapy: Impulsive;Anxious;Restless Overall Cognitive Status: Difficult to assess Area of Impairment: Safety/judgement                               General Comments: Pt currently returning to the bed from [facility] alone without (A) for staff on arrival.         Exercises     Shoulder Instructions       General Comments      Pertinent Vitals/ Pain       Pain Assessment: No/denies pain  Home Living                                          Prior Functioning/Environment              Frequency  Min 2X/week        Progress Toward Goals  OT Goals(current goals can now be found in the care plan section)  Progress towards OT goals: Progressing toward goals  Acute Rehab OT Goals Patient Stated Goal: decrease pain OT Goal Formulation: With patient Time For Goal Achievement: 12/04/18 Potential to Achieve Goals: Good ADL Goals Pt Will Perform Grooming: with supervision;with set-up;standing Pt Will Perform Lower Body Dressing: with set-up;with supervision;sit to/from stand Pt Will Transfer to Toilet: with supervision;ambulating;bedside commode Pt Will Perform Toileting - Clothing Manipulation and hygiene: with supervision;sit to/from stand Pt/caregiver will Perform Home Exercise Program: Increased strength;Both right and left upper extremity;With written HEP provided Additional ADL Goal #1: pt will perform ADL functional transfers with modified independence.  Plan Discharge plan remains appropriate    Co-evaluation                 AM-PAC OT "6 Clicks" Daily Activity     Outcome Measure   Help from another person eating meals?: None Help from another person taking care of personal grooming?: None Help from another person toileting, which includes using toliet, bedpan, or urinal?: None Help from another person bathing (including washing, rinsing, drying)?: A Little Help from another person to put on and taking off regular upper body  clothing?: None Help from another person to put on and taking off regular lower body clothing?: None 6 Click Score: 23    End of Session Equipment Utilized During Treatment: Rolling walker  OT Visit Diagnosis: Unsteadiness on feet (R26.81);Muscle weakness (generalized) (M62.81) Pain - Right/Left: Right Pain - part of body: Leg   Activity Tolerance Patient limited by fatigue   Patient Left in bed;with call bell/phone within reach;with bed alarm set   Nurse Communication Mobility status        Time: 1515-1530 OT Time Calculation (min): 15 min  Charges: OT General Charges $OT Visit: 1 Visit OT Treatments $Therapeutic Activity: 8-22 mins   Jeri Modena, OTR/L  Acute Rehabilitation Services Pager: [phone] Office: [phone] .    Jeri Modena 11/23/2018, 4:44 PM

## 2018-11-23 NOTE — Progress Notes (Signed)
Patient ID: Ryan Medina, male   DOB: November 02, 1960, 58 y.o.   MRN: [id number]  This NP visited patient at the bedside as a follow up for palliative medicine needs and emotional support.  Mr. Higginbotham is alert and oriented.  He is now hospitalized at the main campus at [facility] status post right below the knee amputation.  Patient has had a long  (73 days) complicated hospitalization; inoperable pancreatic adenocarcinoma,  HTN, DM, COPD, ischemic right leg, GIB, PE/ DVT, s/p gastric outlet surgery, malnutrition,  substance abuse all compounded by dense psychosocial issues ((homeless  and minimal social support)  Emotional support offered.  Created space and opportunity for Mr. Endsley to continue to explore his thoughts and feelings about his current medical situation.  Today he expresses that he is tired and "has been through so much"  He remains open to all offered and available medical interventions and his hope is continued quality of life.  Disposition has been difficult, however I hope that perhaps he will now be eligible for some short-term rehab secondary to his amputation.  Hopefully oncology will be able to offer systemic therapies once the patient is in an outpatient setting.  Emotional support offered        Questions and concerns addressed      Palliative medicine team will continue to support holistically.  Total time spent on the unit was 35 minutes  Greater than 50% of the time was spent in counseling and coordination of care  Wadie Lessen NP  Palliative Medicine Team Team Phone # [phone] Pager [phone]

## 2018-11-23 NOTE — Plan of Care (Signed)
  Problem: Education: Goal: Knowledge of General Education information will improve Description Including pain rating scale, medication(s)/side effects and non-pharmacologic comfort measures  Outcome: Progressing   Problem: Pain Managment: Goal: General experience of comfort will improve Outcome: Progressing   Problem: Skin Integrity: Goal: Risk for impaired skin integrity will decrease Outcome: Progressing   Problem: Clinical Measurements: Goal: Ability to maintain clinical measurements within normal limits will improve Outcome: Progressing Goal: Postoperative complications will be avoided or minimized Outcome: Progressing

## 2018-11-24 LAB — CBC
HCT: 24.9 % — ABNORMAL LOW (ref 39.0–52.0)
Hemoglobin: 7.8 g/dL — ABNORMAL LOW (ref 13.0–17.0)
MCH: 28.6 pg (ref 26.0–34.0)
MCHC: 31.3 g/dL (ref 30.0–36.0)
MCV: 91.2 fL (ref 80.0–100.0)
Platelets: 227 10*3/uL (ref 150–400)
RBC: 2.73 MIL/uL — ABNORMAL LOW (ref 4.22–5.81)
RDW: 15.4 % (ref 11.5–15.5)
WBC: 9.7 10*3/uL (ref 4.0–10.5)
nRBC: 0 % (ref 0.0–0.2)

## 2018-11-24 LAB — GLUCOSE, CAPILLARY
GLUCOSE-CAPILLARY: 167 mg/dL — AB (ref 70–99)
GLUCOSE-CAPILLARY: 212 mg/dL — AB (ref 70–99)
Glucose-Capillary: 116 mg/dL — ABNORMAL HIGH (ref 70–99)
Glucose-Capillary: 265 mg/dL — ABNORMAL HIGH (ref 70–99)

## 2018-11-24 NOTE — Plan of Care (Signed)
  Problem: Skin Integrity: Goal: Risk for impaired skin integrity will decrease Outcome: Progressing   Problem: Skin Integrity: Goal: Demonstration of wound healing without infection will improve Outcome: Progressing   Problem: Tissue Perfusion: Goal: Adequacy of tissue perfusion will improve Outcome: Progressing   Problem: Education: Goal: Knowledge of General Education information will improve Description Including pain rating scale, medication(s)/side effects and non-pharmacologic comfort measures  Outcome: Progressing

## 2018-11-24 NOTE — Progress Notes (Signed)
   VASCULAR SURGERY ASSESSMENT & PLAN:   5 Days Post-Op s/p: Right BKA.  I changed his dressing last night.  He had a moderate amount of old bloody drainage.  The dressing was changed again last night.  He has some ecchymosis along the anterior aspect of his incision so we will need to continue to keep a close eye on this.  If this did not heal he would require a above-the-knee amputation.  Continue daily dressing changes.   SUBJECTIVE:   Resting comfortably this morning.  PHYSICAL EXAM:   Vitals:   11/23/18 0448 11/23/18 1344 11/23/18 2006 11/24/18 0548  BP: 122/85 115/85 112/83 122/90  Pulse: (!) 111 (!) 107 (!) 120 (!) 109  Resp: 20  20 14   Temp: 98.3 F (36.8 C) 97.9 F (36.6 C) 98.6 F (37 C) 98.2 F (36.8 C)  TempSrc: Oral Oral Oral Oral  SpO2: 100% 100% 100% 100%  Weight:      Height:       Dressing on his right BKA is dry.  LABS:   Lab Results  Component Value Date   WBC 9.7 11/24/2018   HGB 7.8 (L) 11/24/2018   HCT 24.9 (L) 11/24/2018   MCV 91.2 11/24/2018   PLT 227 11/24/2018   Lab Results  Component Value Date   CREATININE 0.40 (L) 11/23/2018   Lab Results  Component Value Date   INR 1.31 11/02/2018   CBG (last 3)  Recent Labs    11/23/18 1158 11/23/18 1647 11/23/18 2113  GLUCAP 186* 249* 157*    PROBLEM LIST:    Principal Problem:   Popliteal artery embolus (HCC) Active Problems:   DM (diabetes mellitus), type 2, uncontrolled (HCC)   Essential hypertension   Dilated cardiomyopathy (HCC)   Chest pain   AAA (abdominal aortic aneurysm) (HCC)   Ischemic foot   Right leg claudication (HCC)   PVD (peripheral vascular disease) ([place])   Hemorrhagic shock ([place])   Nontraumatic ischemic infarction of muscle of right lower leg   Upper GI bleed   Cocaine abuse ([place])   Acute blood loss anemia   Chronic pain syndrome   Duodenal ulcer with hemorrhage   Pancreatic cancer ([place])   Goals of care, counseling/discussion   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 acetaminophen  1,000 mg Oral Q8H  . apixaban  10 mg Oral BID   Followed by  . [START ON 11/30/2018] apixaban  5 mg Oral BID  . docusate sodium  100 mg Oral Daily  . feeding supplement (ENSURE ENLIVE)  237 mL Oral TID BM  . insulin aspart  0-5 Units Subcutaneous QHS  . insulin aspart  0-9 Units Subcutaneous TID WC  . pantoprazole  40 mg Oral BID  . polyethylene glycol  17 g Oral Daily  . sodium chloride flush  10-40 mL Intracatheter Q12H  . sucralfate  1 g Oral TID WC & HS    [name] Beeper: [phone] Office: [phone] 11/24/2018

## 2018-11-24 NOTE — Progress Notes (Signed)
PROGRESS NOTE    Ryan Medina  MWU:[id number] DOB: 1961-08-07 DOA: 09/03/2018 PCP: Patient, No Pcp Per   Brief Narrative:  Ryan Medina is a 58 y.o. M with DM, HTN, sCHF EF 45% and polysubstance abuse/homelessness who presented initially with left leg pain on 12/13, found to have acute thromboembolic ischemic left leg.  Taken emergently for embolectomy.  Post-op course complicated.  Of primary importance, found to have inoperable pancreatic cancer.  In brief, right leg progressively worse, ultimately gangrenous requiring BKA; difficult balance between -- recurrent thromboembolic events due to malignancy vs. severe and recurrent GIB from duodenal ulcer (erosion from malignancy), all punctuated by pneumonia sepsis, now resolved.    Assessment & Plan:   Principal Problem:   Popliteal artery embolus (HCC) Active Problems:   DM (diabetes mellitus), type 2, uncontrolled ([place])   Essential hypertension   Dilated cardiomyopathy (HCC)   Chest pain   AAA (abdominal aortic aneurysm) (HCC)   Ischemic foot   Right leg claudication (HCC)   PVD (peripheral vascular disease) ([place])   Hemorrhagic shock (HCC)   Nontraumatic ischemic infarction of muscle of right lower leg   Upper GI bleed   Cocaine abuse ([place])   Acute blood loss anemia   Chronic pain syndrome   Duodenal ulcer with hemorrhage   Pancreatic cancer ([place])   Goals of care, counseling/discussion   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   Pancreatic adenocarcinoma ([place])   Palliative care by specialist   Protein-calorie malnutrition, severe   Abdominal distention   Cancer associated pain   HCAP (healthcare-associated pneumonia)   Acute lower UTI   Sepsis ([place])   Homelessness   Weakness generalized   Gastric outflow obstruction   Hematochezia   BRBPR (bright red blood per rectum)   Acute gastrojejunal anastomotic ulcer   Gangrene of right foot (HCC)   Right leg gangrene S/p embolectomy x2 S/p BKA  2/28 Stable    -PT eval appreciated, placement pending -Scheduled acetaminophen, as needed oxycodone for pain    Gastric outlet obstruction Severe protein calorie malnutrition S/p gastrojejunostomy No change -Consulted dietitian, appreciate their recs -Continue Ensure  Acute blood loss anemia on anemia of chronic disease Hematochezia Malignant duodenal ulcer Anemia of iron deficiency Hgb trending down.  Likely this is iron deficiency superimposed on marrow loss (amputation) and previous blood loss. -Feraheme 3/3, c/w oral iron  -Continue PPI and sucralfate  Pacreatic adenoCA -Nonoperative, palliative care involved 2/2 advanced nature for sx control  Candida esophagitis - appears resolved, no sx  Pulmonary embolism GI bleeding appears stopped.  Patient refused Lovenox.  Discussed with Dr. Marin Olp and patient, Eliquis is a suboptimal alternative, but patient will accept. -c/w Eliquis  Chronic systolic and diastolic CHF Stable, no evidence of volume overload  Diabetes Hyperglycemic -Continue with Levemir 4 QHS -Continue SSI  Sepsis from HCAP, Pseudomonas UTI Treated with Cefepime 1/11-1/16, fever resolved.   MDM and disposition: The below labs and imaging reports were reviewed and summarized above.  Medication management as above. Including anticoagulation.  The patient was admitted with an ischemic leg, found to have pancreatic cancer and hypercoagulability causing multiple complications, including a prolonged gastrointestinal hemorrhage.     His GI bleeding is essentially resolved now, and he has been stable.  He finally had to undergo right leg BKA due to gangrene, and he is progressing well from this although vascular surgery is following closely secondary to some bleeding at wound site  The patient at this point in time will  need rehabilitation from his right BKA as well as malnutrition, as well as frequent lab monitoring.  PT/OT evaluations  performed. SNF recommended. SNF appropriate as the patient has received 3 days of hospital care (or the 3 day period has been waived by the patient's insurance company) and is felt to need rehab services to restore this patient to their prior level of function to achieve safe transition back to home care. This patient needs rehab services for at least 5 days per week and skilled nursing services daily to facilitate this transition.  Rehab is being requested as the most appropriate d/c option for this patient and is NOT felt to be for custodial care as evidenced by patient's ability to ambulate as well as perform ADLs prior to admission  DVT prophylaxis: [id number]  Code Status: Partial    Code Status Orders  (From admission, onward)         Start     Ordered   09/07/18 1850  Limited resuscitation (code)  Continuous    Question Answer Comment  In the event of cardiac or respiratory ARREST: Initiate Code Blue, Call Rapid Response No   In the event of cardiac or respiratory ARREST: Perform CPR No   In the event of cardiac or respiratory ARREST: Perform Intubation/Mechanical Ventilation Yes   In the event of cardiac or respiratory ARREST: Use NIPPV/BiPAp only if indicated No   In the event of cardiac or respiratory ARREST: Administer ACLS medications if indicated No   In the event of cardiac or respiratory ARREST: Perform Defibrillation or Cardioversion if indicated No   Comments Continue current care with mechanical ventilation and vasopressor support.      09/07/18 1850        Code Status History    Date Active Date Inactive Code Status Order ID Comments User Context   09/03/2018 2251 09/07/2018 1850 Full Code [id number]  [name], MD Inpatient   09/03/2018 2236 09/03/2018 2251 Full Code [id number]  Iline Oven Inpatient   08/22/2018 2203 08/25/2018 1655 Full Code [id number]  Ivor Costa, MD ED   01/30/2018 0308 01/31/2018 1807 Full Code [id number]  [name], MD  ED   05/19/2016 1029 05/19/2016 1721 Full Code [id number]  Leonie Man, MD Inpatient     Family Communication: none present  Disposition Plan:   To skilled nursing facility 1 authorized Consults called: None today Admission status: Inpatient   Consultants:   Cardiology, general surgery, wound care, palliative care,  Procedures:  Dg Abd 1 View  Result Date: 11/07/2018 CLINICAL DATA:  Abdominal pain. EXAM: ABDOMEN - 1 VIEW COMPARISON:  October 29, 2018 FINDINGS: Moderate fecal loading in the colon. No evidence of bowel obstruction. There is a paucity of small bowel gas but no evidence of obstruction. No other acute abnormalities. IMPRESSION: Moderate fecal loading in the colon. No other acute abnormalities noted. Electronically Signed   By: Dorise Bullion III M.D   On: 11/07/2018 18:37   Ct Chest W Contrast  Addendum Date: 10/27/2018   ADDENDUM REPORT: 10/27/2018 13:41 ADDENDUM: The original report was by Dr. Van Clines. The following addendum is by Dr. Van Clines: Critical Value/emergent results were called by telephone at the time of interpretation on 10/27/2018 at 1:40 pm to Dr. Gerlean Ren , who verbally acknowledged these results. Electronically Signed   By: Van Clines M.D.   On: 10/27/2018 13:41   Result Date: 10/27/2018 CLINICAL DATA:  Pancreatic adenocarcinoma with duodenal ulcer, chemotherapy and radiation therapy  in progress. EXAM: CT CHEST, ABDOMEN, AND PELVIS WITH CONTRAST TECHNIQUE: Multidetector CT imaging of the chest, abdomen and pelvis was performed following the standard protocol during bolus administration of intravenous contrast. CONTRAST:  1103mL OMNIPAQUE IOHEXOL 300 MG/ML  SOLN COMPARISON:  Multiple exams, including CT abdomen from 09/27/2018 FINDINGS: CT CHEST FINDINGS Cardiovascular: Today's exam was not performed for pulmonary arterial assessment, but there is mostly peripheral and segmental right lower lobe filling defect in the pulmonary artery  compatible with pulmonary embolus, likely chronic. Similar peripheral segmental and subsegmental filling defects in the left upper lobe and left lower lobe pulmonary arteries. Right Port-A-Cath tip: Cavoatrial junction. Atherosclerotic calcification of the aortic arch and branch vessels. Mediastinum/Nodes: Hazy density in the mediastinal adipose tissue as well as the subcutaneous adipose tissue possibly from third spacing of fluid. Right lower paratracheal node 1.1 cm in short axis on image 42/7. Lungs/Pleura: Bandlike scarring or atelectasis in the left lower lobe at the site of prior airspace opacity, mildly improved although there is some continued anterior airspace opacity. Musculoskeletal: Small oblong sclerotic lesion in the left eighth rib lateral, not changed from 08/22/2018 common no cortical irregularity or cortical destruction. CT ABDOMEN PELVIS FINDINGS Hepatobiliary: Lateral segment left hepatic lobe cyst on image 88/7. Potential 4 mm hypodense lesion in the right hepatic lobe on image 102/7, technically nonspecific. Pancreas: Highly ill-defined pancreas. Indistinct stranding around the celiac trunk and superior mesenteric artery. Hypodensity favoring pancreatic body mass measuring about 2.4 by 4.1 cm on image 108/7. Dilated dorsal pancreatic duct. Spleen: Unremarkable. Adrenals/Urinary Tract: Small hypodense renal lesions are technically too small to characterize although statistically likely to be benign. Adrenal glands unremarkable. Stomach/Bowel: Prominently dilated stomach filled with food material. Poor definition of the duodenum. I can not exclude gastric outlet obstruction. Presumed vascular coils in the gastroduodenal artery. Poor definition of bowel wall due to the diffuse edema. Vascular/Lymphatic: Mild gastric varices. Infrarenal abdominal aortic aneurysm 3.1 cm in diameter. Aneurysmal bilateral common iliac arteries. Ectatic external iliac arteries. The portal vein and SMV remain patent.  Varices are noted along the omentum and stomach. Reproductive: Unremarkable Other: Diffuse subcutaneous and mesenteric edema. Musculoskeletal: Spondylosis and degenerative disc disease at L5-S1. IMPRESSION: 1. Chronic appearing bilateral pulmonary embolus most notably involving the right lower lobe. 2. Mild enlargement of the pancreatic mass. No findings of metastatic disease to the liver currently. Small cyst in the lateral segment left hepatic lobe. 3. Notable dilatation of the stomach which is filled with food. This raises the possibility of gastric outlet obstruction or narrowing along the descending duodenum. No bowel dilatation. 4. Third spacing of fluid with diffuse subcutaneous, mesenteric, and mediastinal edema. 5. Gastric and omental varices likely from portal venous hypertension. 6. Mild aneurysmal dilatation of the abdominal aorta at 3.1 cm with aneurysmal iliac arteries. Recommend followup by [place] in 3 years. This recommendation follows ACR consensus guidelines: White Paper of the ACR Incidental Findings Committee II on Vascular Findings. J Am Coll Radiol 2013; 10:789-794. 7.  Aortic Atherosclerosis (ICD10-I70.0). Radiology assistant personnel have been notified to put me in telephone contact with the referring physician or the referring physician's clinical representative in order to discuss these findings. Once this communication is established I will issue an addendum to this report for documentation purposes. Electronically Signed: By: Van Clines M.D. On: 10/27/2018 13:00   Ct Abdomen Pelvis W Contrast  Addendum Date: 10/27/2018   ADDENDUM REPORT: 10/27/2018 13:41 ADDENDUM: The original report was by Dr. Van Clines. The following addendum is by  Dr. Van Clines: Critical Value/emergent results were called by telephone at the time of interpretation on 10/27/2018 at 1:40 pm to Dr. Gerlean Ren , who verbally acknowledged these results. Electronically Signed   By: Van Clines  M.D.   On: 10/27/2018 13:41   Result Date: 10/27/2018 CLINICAL DATA:  Pancreatic adenocarcinoma with duodenal ulcer, chemotherapy and radiation therapy in progress. EXAM: CT CHEST, ABDOMEN, AND PELVIS WITH CONTRAST TECHNIQUE: Multidetector CT imaging of the chest, abdomen and pelvis was performed following the standard protocol during bolus administration of intravenous contrast. CONTRAST:  112mL OMNIPAQUE IOHEXOL 300 MG/ML  SOLN COMPARISON:  Multiple exams, including CT abdomen from 09/27/2018 FINDINGS: CT CHEST FINDINGS Cardiovascular: Today's exam was not performed for pulmonary arterial assessment, but there is mostly peripheral and segmental right lower lobe filling defect in the pulmonary artery compatible with pulmonary embolus, likely chronic. Similar peripheral segmental and subsegmental filling defects in the left upper lobe and left lower lobe pulmonary arteries. Right Port-A-Cath tip: Cavoatrial junction. Atherosclerotic calcification of the aortic arch and branch vessels. Mediastinum/Nodes: Hazy density in the mediastinal adipose tissue as well as the subcutaneous adipose tissue possibly from third spacing of fluid. Right lower paratracheal node 1.1 cm in short axis on image 42/7. Lungs/Pleura: Bandlike scarring or atelectasis in the left lower lobe at the site of prior airspace opacity, mildly improved although there is some continued anterior airspace opacity. Musculoskeletal: Small oblong sclerotic lesion in the left eighth rib lateral, not changed from 08/22/2018 common no cortical irregularity or cortical destruction. CT ABDOMEN PELVIS FINDINGS Hepatobiliary: Lateral segment left hepatic lobe cyst on image 88/7. Potential 4 mm hypodense lesion in the right hepatic lobe on image 102/7, technically nonspecific. Pancreas: Highly ill-defined pancreas. Indistinct stranding around the celiac trunk and superior mesenteric artery. Hypodensity favoring pancreatic body mass measuring about 2.4 by 4.1 cm on  image 108/7. Dilated dorsal pancreatic duct. Spleen: Unremarkable. Adrenals/Urinary Tract: Small hypodense renal lesions are technically too small to characterize although statistically likely to be benign. Adrenal glands unremarkable. Stomach/Bowel: Prominently dilated stomach filled with food material. Poor definition of the duodenum. I can not exclude gastric outlet obstruction. Presumed vascular coils in the gastroduodenal artery. Poor definition of bowel wall due to the diffuse edema. Vascular/Lymphatic: Mild gastric varices. Infrarenal abdominal aortic aneurysm 3.1 cm in diameter. Aneurysmal bilateral common iliac arteries. Ectatic external iliac arteries. The portal vein and SMV remain patent. Varices are noted along the omentum and stomach. Reproductive: Unremarkable Other: Diffuse subcutaneous and mesenteric edema. Musculoskeletal: Spondylosis and degenerative disc disease at L5-S1. IMPRESSION: 1. Chronic appearing bilateral pulmonary embolus most notably involving the right lower lobe. 2. Mild enlargement of the pancreatic mass. No findings of metastatic disease to the liver currently. Small cyst in the lateral segment left hepatic lobe. 3. Notable dilatation of the stomach which is filled with food. This raises the possibility of gastric outlet obstruction or narrowing along the descending duodenum. No bowel dilatation. 4. Third spacing of fluid with diffuse subcutaneous, mesenteric, and mediastinal edema. 5. Gastric and omental varices likely from portal venous hypertension. 6. Mild aneurysmal dilatation of the abdominal aorta at 3.1 cm with aneurysmal iliac arteries. Recommend followup by [place] in 3 years. This recommendation follows ACR consensus guidelines: White Paper of the ACR Incidental Findings Committee II on Vascular Findings. J Am Coll Radiol 2013; 10:789-794. 7.  Aortic Atherosclerosis (ICD10-I70.0). Radiology assistant personnel have been notified to put me in telephone contact with the  referring physician or the referring physician's clinical representative in  order to discuss these findings. Once this communication is established I will issue an addendum to this report for documentation purposes. Electronically Signed: By: Van Clines M.D. On: 10/27/2018 13:00   Dg Abd Portable 1v  Result Date: 10/29/2018 CLINICAL DATA:  Follow-up gastric catheter placement EXAM: PORTABLE ABDOMEN - 1 VIEW COMPARISON:  None. FINDINGS: Gastric catheter is noted within the stomach. Changes of prior embolus therapy are noted in the right upper quadrant. Contrast material is noted scattered throughout the colon related to the recent CT examination. Mild retained fecal material is noted. IMPRESSION: Gastric catheter within the stomach. Electronically Signed   By: Inez Catalina M.D.   On: 10/29/2018 16:34   [place] Ekg Site Rite  Result Date: 11/05/2018 If Site Rite image not attached, placement could not be confirmed due to current cardiac rhythm.  Ct Angio Abd/pel W/ And/or W/o  Result Date: 11/10/2018 CLINICAL DATA:  Pancreatic carcinoma with partial duodenal obstruction, gastric varices. GI bleed since gastrojejunostomy on 11/04/18^ EXAM: CTA ABDOMEN AND PELVIS wITHOUT AND WITH CONTRAST TECHNIQUE: Multidetector CT imaging of the abdomen and pelvis was performed using the standard protocol during bolus administration of intravenous contrast. Multiplanar reconstructed images and MIPs were obtained and reviewed to evaluate the vascular anatomy. CONTRAST:  127mL ISOVUE-370 IOPAMIDOL (ISOVUE-370) INJECTION 76% COMPARISON:  10/27/2018 FINDINGS: VASCULAR Aorta: Fusiform abdominal aortic aneurysm measured up to 3.1 cm maximum diameter, stable. No significant intraluminal thrombus. No dissection or stenosis. Celiac: Patent. The left gastric artery arises directly from the abdominal aorta, an anatomic variant. Metallic gastroduodenal embolization coils without evidence of recanalization. SMA: Patent without  evidence of aneurysm, dissection, vasculitis or significant stenosis. Renals: Both renal arteries are patent without evidence of aneurysm, dissection, vasculitis, fibromuscular dysplasia or significant stenosis. IMA: Short-segment origin stenosis. Distally patent without evidence of aneurysm, dissection, vasculitis or significant stenosis. Inflow: Fusiform dilatation of the right common iliac artery up to 2.9 cm diameter. Fusiform dilatation of left common iliac artery up to 2.7 cm diameter with some eccentric nonocclusive mural thrombus. Tortuous ectatic distal iliac arterial systems without significant stenosis. Proximal Outflow: Bilateral common femoral and visualized portions of the superficial and profunda femoral arteries are patent without evidence of aneurysm, dissection, vasculitis or significant stenosis. Veins: Apparent splenic venous occlusion with collaterals from the splenic hilum extending along the anterior mesentery. Streak artifact from GDA embolization coils obscures visualization of the portosplenic confluence. Main portal vein and intrahepatic branches are patent. Hepatic veins patent. Review of the MIP images confirms the above findings. NON-VASCULAR Lower chest: Chronic atelectasis/scarring in the left lower lobe. No pleural or pericardial effusion. Hepatobiliary: No focal liver abnormality is seen. No gallstones, gallbladder wall thickening, or biliary dilatation. Pancreas: Decreased enhancement in the mid pancreatic body. Atrophy in the pancreatic tail. Some image degradation through the pancreatic head secondary to regional embolization coils. Spleen: Normal in size without focal lesion. Adrenals/Urinary Tract: Adrenal glands are unremarkable. Kidneys are normal, without renal calculi, focal lesion, or hydronephrosis. Bladder is unremarkable. Stomach/Bowel: Stomach is nondilated. Gastrojejunostomy staple line in the lateral gastric body. No evidence of active extravasation. Small bowel is  decompressed. Moderate proximal colonic and rectal fecal material without dilatation. Lymphatic: No definite abdominal or pelvic adenopathy, evaluation limited secondary to paucity of fat and diffuse anasarca. Reproductive: Prostate is unremarkable. Other: No significant ascites. Scattered bubbles of free intraperitoneal air presumably postoperative. Musculoskeletal: Midline anterior abdominal wall skin staples. Lumbosacral spondylitic change. No fracture or worrisome bone lesion. IMPRESSION: VASCULAR 1. No evidence of active extravasation.  2. 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 3. 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 NON-VASCULAR 1. No acute findings. 2. Decreased enhancement in the mid pancreatic body suggesting residual/recurrent neoplasm. Electronically Signed   By: Lucrezia Europe M.D.   On: 11/10/2018 16:46     Antimicrobials:   None currently   Subjective: Doing well, resting on edge of bed, did have a dressing takedown on BKA.  Reports some bleeding.  Otherwise patient reports no pain doing well.  Objective: Vitals:   11/23/18 0448 11/23/18 1344 11/23/18 2006 11/24/18 0548  BP: 122/85 115/85 112/83 122/90  Pulse: (!) 111 (!) 107 (!) 120 (!) 109  Resp: 20  20 14   Temp: 98.3 F (36.8 C) 97.9 F (36.6 C) 98.6 F (37 C) 98.2 F (36.8 C)  TempSrc: Oral Oral Oral Oral  SpO2: 100% 100% 100% 100%  Weight:      Height:        Intake/Output Summary (Last 24 hours) at 11/24/2018 1201 Last data filed at 11/24/2018 1045 Gross per 24 hour  Intake 500 ml  Output 100 ml  Net 400 ml   Filed Weights   11/02/18 1806 11/04/18 0831 11/12/18 0742  Weight: 56.2 kg 56.2 kg 56.2 kg    Examination:  General exam: Appears calm and comfortable  Respiratory system: Clear to auscultation. Respiratory effort normal. Cardiovascular system: S1 & S2 heard, RRR. No JVD, murmurs, rubs,  gallops or clicks. No pedal edema. Gastrointestinal system: Abdomen is nondistended, soft and nontender. No organomegaly or masses felt. Normal bowel sounds heard. Central nervous system: Alert and oriented. No focal neurological deficits. Extremities: Status post right BKA, no bleedthrough on site.  Remainder of exam within normal limits with strength 5 out of 5 in upper extremities bilaterally and left lower extremity Skin: No rashes, lesions or ulcers, dressing not taken down Psychiatry: Judgement and insight appear normal. Mood & affect appropriate.     Data Reviewed: I have personally reviewed following labs and imaging studies  CBC: Recent Labs  Lab 11/21/18 0428 11/22/18 0352 11/23/18 0447 11/23/18 1042 11/24/18 0347  WBC 11.1* 11.1* 13.0* 12.4* 9.7  NEUTROABS  --   --   --  10.5*  --   HGB 9.6* 8.8* 8.1* 7.7* 7.8*  HCT 29.0* 27.1* 25.7* 24.4* 24.9*  MCV 86.3 87.7 90.5 90.7 91.2  PLT 315 327 285 246 409   Basic Metabolic Panel: Recent Labs  Lab 11/19/18 1148 11/20/18 0355 11/21/18 0428 11/22/18 0352 11/23/18 0447  NA  --  131* 133* 132* 134*  K  --  4.0 3.7 3.4* 3.4*  CL  --  96* 96* 96* 99  CO2  --  27 28 24 30   GLUCOSE  --  276* 161* 142* 209*  BUN  --  7 8 11 14   CREATININE 0.49* 0.55* 0.54* 0.47* 0.40*  CALCIUM  --  8.0* 8.3* 8.0* 8.4*   GFR: Estimated Creatinine Clearance: 81 mL/min (A) (by C-G formula based on SCr of 0.4 mg/dL (L)). Liver Function Tests: Recent Labs  Lab 11/23/18 0447  AST 28  ALT 29  ALKPHOS 84  BILITOT 0.5  PROT 6.5  ALBUMIN 1.8*   No results for input(s): LIPASE, AMYLASE in the last 168 hours. No results for input(s): AMMONIA in the last 168 hours. Coagulation Profile: No results for input(s): INR, PROTIME in the last 168 hours. Cardiac Enzymes: No results for  input(s): CKTOTAL, CKMB, CKMBINDEX, TROPONINI in the last 168 hours. BNP (last 3 results) No results for input(s): PROBNP in the last 8760 hours. HbA1C: No  results for input(s): HGBA1C in the last 72 hours. CBG: Recent Labs  Lab 11/23/18 0745 11/23/18 1158 11/23/18 1647 11/23/18 2113 11/24/18 0823  GLUCAP 270* 186* 249* 157* 167*   Lipid Profile: No results for input(s): CHOL, HDL, LDLCALC, TRIG, CHOLHDL, LDLDIRECT in the last 72 hours. Thyroid Function Tests: No results for input(s): TSH, T4TOTAL, FREET4, T3FREE, THYROIDAB in the last 72 hours. Anemia Panel: Recent Labs    11/23/18 0447  FERRITIN 1,147*  TIBC 175*  IRON 12*   Sepsis Labs: No results for input(s): PROCALCITON, LATICACIDVEN in the last 168 hours.  Recent Results (from the past 240 hour(s))  Surgical pcr screen     Status: None   Collection Time: 11/18/18  2:58 PM  Result Value Ref Range Status   MRSA, PCR NEGATIVE NEGATIVE Final   Staphylococcus aureus NEGATIVE NEGATIVE Final    Comment: (NOTE) The Xpert SA Assay (FDA approved for NASAL specimens in patients 80 years of age and older), is one component of a comprehensive surveillance program. It is not intended to diagnose infection nor to guide or monitor treatment. Performed at [facility], [place] [address], [place], [place] [address]          Radiology Studies: No results found.      Scheduled Meds: . acetaminophen  1,000 mg Oral Q8H  . apixaban  10 mg Oral BID   Followed by  . [START ON 11/30/2018] apixaban  5 mg Oral BID  . docusate sodium  100 mg Oral Daily  . feeding supplement (ENSURE ENLIVE)  237 mL Oral TID BM  . insulin aspart  0-5 Units Subcutaneous QHS  . insulin aspart  0-9 Units Subcutaneous TID WC  . pantoprazole  40 mg Oral BID  . polyethylene glycol  17 g Oral Daily  . sodium chloride flush  10-40 mL Intracatheter Q12H  . sucralfate  1 g Oral TID WC & HS   Continuous Infusions: . magnesium sulfate 1 - 4 g bolus IVPB       LOS: 82 days    Time spent: 35 min    Nicolette Bang, MD [facility]  If 7PM-7AM, please contact  night-coverage  11/24/2018, 12:01 PM

## 2018-11-25 ENCOUNTER — Inpatient Hospital Stay (HOSPITAL_COMMUNITY): Payer: Medicaid Other

## 2018-11-25 LAB — CBC
HCT: 23.2 % — ABNORMAL LOW (ref 39.0–52.0)
Hemoglobin: 7.4 g/dL — ABNORMAL LOW (ref 13.0–17.0)
MCH: 29.1 pg (ref 26.0–34.0)
MCHC: 31.9 g/dL (ref 30.0–36.0)
MCV: 91.3 fL (ref 80.0–100.0)
Platelets: 256 10*3/uL (ref 150–400)
RBC: 2.54 MIL/uL — ABNORMAL LOW (ref 4.22–5.81)
RDW: 14.9 % (ref 11.5–15.5)
WBC: 8.2 10*3/uL (ref 4.0–10.5)
nRBC: 0 % (ref 0.0–0.2)

## 2018-11-25 LAB — GLUCOSE, CAPILLARY
Glucose-Capillary: 216 mg/dL — ABNORMAL HIGH (ref 70–99)
Glucose-Capillary: 157 mg/dL — ABNORMAL HIGH (ref 70–99)
Glucose-Capillary: 264 mg/dL — ABNORMAL HIGH (ref 70–99)
Glucose-Capillary: 49 mg/dL — ABNORMAL LOW (ref 70–99)
Glucose-Capillary: 153 mg/dL — ABNORMAL HIGH (ref 70–99)
Glucose-Capillary: 182 mg/dL — ABNORMAL HIGH (ref 70–99)

## 2018-11-25 LAB — PREPARE RBC (CROSSMATCH)

## 2018-11-25 MED ORDER — FUROSEMIDE 10 MG/ML IJ SOLN
20.0000 mg | Freq: Once | INTRAMUSCULAR | Status: AC
  Administered 2018-11-25: 20 mg via INTRAVENOUS
  Filled 2018-11-25: qty 2

## 2018-11-25 MED ORDER — IOHEXOL 300 MG/ML  SOLN
100.0000 mL | Freq: Once | INTRAMUSCULAR | Status: AC | PRN
  Administered 2018-11-25: 100 mL via INTRAVENOUS

## 2018-11-25 MED ORDER — APIXABAN 5 MG PO TABS
5.0000 mg | ORAL_TABLET | Freq: Two times a day (BID) | ORAL | Status: DC
  Administered 2018-11-25 – 2018-12-06 (×23): 5 mg via ORAL
  Filled 2018-11-25 (×24): qty 1

## 2018-11-25 MED ORDER — SODIUM CHLORIDE 0.9% IV SOLUTION
Freq: Once | INTRAVENOUS | Status: AC
  Administered 2018-11-25: 08:00:00 via INTRAVENOUS

## 2018-11-25 NOTE — Progress Notes (Signed)
[facility] denied bed offer, no other acceptances at this time. Director Nathaniel Man working with CSW on this difficult to place patient.   Will continue to identify possible placement options and keep treatment team up to date.   [place], [place]

## 2018-11-25 NOTE — Progress Notes (Signed)
Hypoglycemic Event  CBG:49  Treatment: 8 oz juice/soda  Symptoms: Sweaty and Shaky  Follow-up CBG: Time: 2046 CBG Result: 153 Possible Reasons for Event: Inadequate meal intake  Comments/MD notified: Called RN to inform her he felt his sugar is dropping; RN checked CBG to be 49; but A&O x4; verbally responsive; states he's "shaky and getting the chills" that how he new; 8oz of juice and saltine crackers given. Pt states he felt better after drinking the juice. CBG up to 153. Hypoglycemia protocol initiated; Pt resting comfortably in bed with call light within reach. Will closely monitor pt. Francis Gaines Ferrin Liebig RN    Teisha Trowbridge

## 2018-11-25 NOTE — Progress Notes (Signed)
   VASCULAR SURGERY ASSESSMENT & PLAN:   6 Days Post-Op s/p: Right below the knee amputation.  I looked at this again this morning and there is really no significant drainage.  The ecchymosis on the anterior aspect of the amputation site is unchanged.  Continue daily dressing changes.  SUBJECTIVE:   No complaints.  PHYSICAL EXAM:   Vitals:   11/24/18 1420 11/24/18 2005 11/25/18 0406 11/25/18 0900  BP: 120/90 104/68 (!) 128/106 (!) 123/94  Pulse: (!) 104 (!) 109 98 97  Resp: 17 20 12 17   Temp: 97.7 F (36.5 C) 98.4 F (36.9 C) 98 F (36.7 C) 98.5 F (36.9 C)  TempSrc: Oral Oral Oral Oral  SpO2: 100% 100% 100% 100%  Weight:      Height:       I examined his right below the knee amputation and this is healing adequately.  He has some ecchymosis along the anterior aspect of the amputation site.  There is no further bloody drainage.  There is no erythema.  The wound was redressed.  LABS:   Lab Results  Component Value Date   WBC 8.2 11/25/2018   HGB 7.4 (L) 11/25/2018   HCT 23.2 (L) 11/25/2018   MCV 91.3 11/25/2018   PLT 256 11/25/2018   Lab Results  Component Value Date   CREATININE 0.40 (L) 11/23/2018   Lab Results  Component Value Date   INR 1.31 11/02/2018   CBG (last 3)  Recent Labs    11/24/18 1728 11/24/18 2105 11/25/18 0815  GLUCAP 265* 212* 216*    PROBLEM LIST:    Principal Problem:   Popliteal artery embolus (HCC) Active Problems:   DM (diabetes mellitus), type 2, uncontrolled (HCC)   Essential hypertension   Dilated cardiomyopathy (HCC)   Chest pain   AAA (abdominal aortic aneurysm) (HCC)   Ischemic foot   Right leg claudication (HCC)   PVD (peripheral vascular disease) ([place])   Hemorrhagic shock ([place])   Nontraumatic ischemic infarction of muscle of right lower leg   Upper GI bleed   Cocaine abuse ([place])   Acute blood loss anemia   Chronic pain syndrome   Duodenal ulcer with hemorrhage   Pancreatic cancer (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 acetaminophen  1,000 mg Oral Q8H  . apixaban  5 mg Oral BID  . docusate sodium  100 mg Oral Daily  . feeding supplement (ENSURE ENLIVE)  237 mL Oral TID BM  . furosemide  20 mg Intravenous Once  . furosemide  20 mg Intravenous Once  . insulin aspart  0-5 Units Subcutaneous QHS  . insulin aspart  0-9 Units Subcutaneous TID WC  . pantoprazole  40 mg Oral BID  . polyethylene glycol  17 g Oral Daily  . sodium chloride flush  10-40 mL Intracatheter Q12H  . sucralfate  1 g Oral TID WC & HS    [name] Beeper: [phone] Office: [phone] 11/25/2018

## 2018-11-25 NOTE — Progress Notes (Signed)
Pt transported off unit to radiology. P. Amo Kylan Veach RN 

## 2018-11-25 NOTE — Progress Notes (Signed)
Pt back to room from procedure. P. Amo Sophiea Ueda RN 

## 2018-11-25 NOTE — Progress Notes (Signed)
So far, Ryan Medina is about the same.  He is doing pretty well after having the right BKA.  The problem continues to be there is nowhere for him to go as an outpatient.  His hemoglobin is dropping slowly.  I am sure that he probably is losing it through the GI tract.  He got iron earlier this week.  His hemoglobin is 7.4.  He probably needs to be transfused again.  I think that may not be a bad idea to get another CT scan of his abdomen and pelvis.  Maybe we can tell how his pancreatic cancer is progressing.  The problem with treating him is that I do not know if we will be able to treat him for the cancer if he does not have any place to go.  He is eating.  He is having no nausea or vomiting.  He is having no obvious melena.  There is no fever.  He is on Eliquis now.  I thought this probably would be a lot easier for him with respect to outpatient therapy.  I definitely would not put him on a loading dose.  I think this is clearly going to increase his risk of clinical bleeding.  I understand pharmacy when they dosed him.  However, I just think that the 10 mg twice daily dose is going to be too much for him.  There really is no change in his physical exam.  He does look a little bit thinner.  His oral exam shows no thrush.  Lungs are clear bilaterally.  Cardiac exam regular rate and rhythm with no murmurs, rubs or bruits.  Abdomen is soft.  He has good bowel sounds.  There is no fluid wave.  There is no guarding or rebound tenderness.  His laparotomy scar is well-healed.  Extremities shows muscle atrophy in upper and lower extremities.  He has the right BKA.  Neurological exam is non-focal.  Mr. Pickrell is going on for months in the hospital.  Again, there is no place that will take him because of his past "indiscretions."  Again I think a CT of the abdomen and pelvis would not be a bad idea so we can see how his pancreatic cancer is progressing.  I will change his Eliquis back to 5 mg p.o. twice  daily.  He will get his transfusion today.   [name], MD  Romans 8:38-39

## 2018-11-25 NOTE — Progress Notes (Signed)
PROGRESS NOTE    Terrall Bley Torgeson  OMV:[id number] DOB: 02-24-1961 DOA: 09/03/2018 PCP: Patient, No Pcp Per   Brief Narrative:  Per admitting physician:  Mr. Keysor is a58 y.o.M with DM, HTN, sCHF EF 45% and polysubstance abuse/homelessness who presented initially with left leg pain on 12/13, found to have acute thromboembolic ischemic left leg. Taken emergently for embolectomy.  Post-op course complicated. Of primary importance, found to have inoperable pancreatic cancer. In brief, right leg progressively worse, ultimately gangrenous requiring BKA; difficult balance between -- recurrent thromboembolic events due to malignancy vs. severe and recurrent GIB from duodenal ulcer (erosion from malignancy), all punctuated by pneumonia sepsis, now resolved.    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 No evid of new bleeding -Feraheme 3/3, c/w oral iron -ContinuePPI and sucralfate  Pacreatic adenoCA -Nonoperative, palliative care involved 2/2 advanced nature for sx control  Candida esophagitis - appears resolved, no sx  Pulmonary embolism GI bleeding appears stopped. Patient refused Lovenox. Discussed with Dr. Marin Olp and patient, Eliquis is a suboptimal alternative, but patient will accept. -c/w Eliquis  Chronic systolic and diastolic CHF Stable, no evidence of volume overload  Diabetes Hyperglycemic -Continue with Levemir 4  QHS -Continue SSI  Sepsis from HCAP, Pseudomonas UTI Treated with Cefepime 1/11-1/16, fever resolved.  MDM and disposition: The below labs and imaging reports were reviewed and summarized above. Medication management as above. Including anticoagulation.  The patient was admitted with an ischemic leg, found to have pancreatic cancer and hypercoagulability causing multiple complications, including a prolonged gastrointestinal hemorrhage.   His GI bleeding is essentially resolved now, and he has been stable. He finally had to undergo right leg BKA due to gangrene, and he is progressing well from this although vascular surgery is following closely secondary to some bleeding at wound site  The patient at this point in time will need rehabilitation from his right BKA as well as malnutrition, as well as frequent lab monitoring. PT/OT evaluations performed. SNF recommended. SNF appropriate as the patient has received 58 days of hospital care (or the 3 day period has been waived by the patient's insurance company) and is felt to need rehab services to restore this patient to their prior level of function to achieve safe transition back to home care. This patient needs rehab services for at least 58 days per week and skilled nursing services daily to facilitate this transition.  Rehab is being requested as the most appropriate d/c option for this patient and is NOT felt to be for custodial care as evidenced by patient's ability to ambulate as well as perform ADLs prior to admission   Assessment & Plan:   Principal Problem:   Popliteal artery embolus (HCC) Active Problems:   DM (diabetes mellitus), type 2, uncontrolled ([place])   Essential hypertension   Dilated cardiomyopathy ([place])   Chest pain   AAA (abdominal aortic aneurysm) ([place])   Ischemic foot   Right leg claudication ([place])   PVD (peripheral vascular disease) ([place])   Hemorrhagic shock ([place])   Nontraumatic ischemic infarction of  muscle  of right lower leg   Upper GI bleed   Cocaine abuse (HCC)   Acute blood loss anemia   Chronic pain syndrome   Duodenal ulcer with hemorrhage   Pancreatic cancer ([place])   Goals of care, counseling/discussion   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   Pancreatic adenocarcinoma ([place])   Palliative care by specialist   Protein-calorie malnutrition, severe   Abdominal distention   Cancer associated pain   HCAP (healthcare-associated pneumonia)   Acute lower UTI   Sepsis ([place])   Homelessness   Weakness generalized   Gastric outflow obstruction   Hematochezia   BRBPR (bright red blood per rectum)   Acute gastrojejunal anastomotic ulcer   Gangrene of right foot ([place])     DVT prophylaxis: AY:[id number]  Code Status:     Code Status Orders  (From admission, onward)         Start     Ordered   09/07/18 1850  Limited resuscitation (code)  Continuous    Question Answer Comment  In the event of cardiac or respiratory ARREST: Initiate Code Blue, Call Rapid Response No   In the event of cardiac or respiratory ARREST: Perform CPR No   In the event of cardiac or respiratory ARREST: Perform Intubation/Mechanical Ventilation Yes   In the event of cardiac or respiratory ARREST: Use NIPPV/BiPAp only if indicated No   In the event of cardiac or respiratory ARREST: Administer ACLS medications if indicated No   In the event of cardiac or respiratory ARREST: Perform Defibrillation or Cardioversion if indicated No   Comments Continue current care with mechanical ventilation and vasopressor support.      09/07/18 1850        Code Status History    Date Active Date Inactive Code Status Order ID Comments User Context   09/03/2018 2251 09/07/2018 1850 Full Code [id number]  [name], MD Inpatient   09/03/2018 2236 09/03/2018 2251 Full Code [id number]  Iline Oven Inpatient   08/22/2018 2203 08/25/2018 1655 Full Code [id number]  Ivor Costa, MD ED   01/30/2018 0308  01/31/2018 1807 Full Code [id number]  [name], MD ED   05/19/2016 1029 05/19/2016 1721 Full Code [id number]  Leonie Man, MD Inpatient     Family Communication: NONE PRESENT  Disposition Plan:   PENDING SNF PLACEMENT Consults called: NONE TODAY Admission status: Inpatient   Consultants:   Cardiology, general surgery, wound care, palliative care,  Procedures:  Dg Abd 1 View  Result Date: 11/07/2018 CLINICAL DATA:  Abdominal pain. EXAM: ABDOMEN - 1 VIEW COMPARISON:  October 29, 2018 FINDINGS: Moderate fecal loading in the colon. No evidence of bowel obstruction. There is a paucity of small bowel gas but no evidence of obstruction. No other acute abnormalities. IMPRESSION: Moderate fecal loading in the colon. No other acute abnormalities noted. Electronically Signed   By: Dorise Bullion III M.D   On: 11/07/2018 18:37   Ct Chest W Contrast  Addendum Date: 10/27/2018   ADDENDUM REPORT: 10/27/2018 13:41 ADDENDUM: The original report was by Dr. Van Clines. The following addendum is by Dr. Van Clines: Critical Value/emergent results were called by telephone at the time of interpretation on 10/27/2018 at 1:40 pm to Dr. Gerlean Ren , who verbally acknowledged these results. Electronically Signed   By: Van Clines M.D.   On: 10/27/2018 13:41   Result Date: 10/27/2018 CLINICAL DATA:  Pancreatic adenocarcinoma with duodenal ulcer, chemotherapy and radiation therapy in progress. EXAM: CT  CHEST, ABDOMEN, AND PELVIS WITH CONTRAST TECHNIQUE: Multidetector CT imaging of the chest, abdomen and pelvis was performed following the standard protocol during bolus administration of intravenous contrast. CONTRAST:  156mL OMNIPAQUE IOHEXOL 300 MG/ML  SOLN COMPARISON:  Multiple exams, including CT abdomen from 09/27/2018 FINDINGS: CT CHEST FINDINGS Cardiovascular: Today's exam was not performed for pulmonary arterial assessment, but there is mostly peripheral and segmental right lower lobe  filling defect in the pulmonary artery compatible with pulmonary embolus, likely chronic. Similar peripheral segmental and subsegmental filling defects in the left upper lobe and left lower lobe pulmonary arteries. Right Port-A-Cath tip: Cavoatrial junction. Atherosclerotic calcification of the aortic arch and branch vessels. Mediastinum/Nodes: Hazy density in the mediastinal adipose tissue as well as the subcutaneous adipose tissue possibly from third spacing of fluid. Right lower paratracheal node 1.1 cm in short axis on image 42/7. Lungs/Pleura: Bandlike scarring or atelectasis in the left lower lobe at the site of prior airspace opacity, mildly improved although there is some continued anterior airspace opacity. Musculoskeletal: Small oblong sclerotic lesion in the left eighth rib lateral, not changed from 08/22/2018 common no cortical irregularity or cortical destruction. CT ABDOMEN PELVIS FINDINGS Hepatobiliary: Lateral segment left hepatic lobe cyst on image 88/7. Potential 4 mm hypodense lesion in the right hepatic lobe on image 102/7, technically nonspecific. Pancreas: Highly ill-defined pancreas. Indistinct stranding around the celiac trunk and superior mesenteric artery. Hypodensity favoring pancreatic body mass measuring about 2.4 by 4.1 cm on image 108/7. Dilated dorsal pancreatic duct. Spleen: Unremarkable. Adrenals/Urinary Tract: Small hypodense renal lesions are technically too small to characterize although statistically likely to be benign. Adrenal glands unremarkable. Stomach/Bowel: Prominently dilated stomach filled with food material. Poor definition of the duodenum. I can not exclude gastric outlet obstruction. Presumed vascular coils in the gastroduodenal artery. Poor definition of bowel wall due to the diffuse edema. Vascular/Lymphatic: Mild gastric varices. Infrarenal abdominal aortic aneurysm 3.1 cm in diameter. Aneurysmal bilateral common iliac arteries. Ectatic external iliac arteries.  The portal vein and SMV remain patent. Varices are noted along the omentum and stomach. Reproductive: Unremarkable Other: Diffuse subcutaneous and mesenteric edema. Musculoskeletal: Spondylosis and degenerative disc disease at L5-S1. IMPRESSION: 1. Chronic appearing bilateral pulmonary embolus most notably involving the right lower lobe. 2. Mild enlargement of the pancreatic mass. No findings of metastatic disease to the liver currently. Small cyst in the lateral segment left hepatic lobe. 3. Notable dilatation of the stomach which is filled with food. This raises the possibility of gastric outlet obstruction or narrowing along the descending duodenum. No bowel dilatation. 4. Third spacing of fluid with diffuse subcutaneous, mesenteric, and mediastinal edema. 5. Gastric and omental varices likely from portal venous hypertension. 6. Mild aneurysmal dilatation of the abdominal aorta at 3.1 cm with aneurysmal iliac arteries. Recommend followup by [place] in 3 years. This recommendation follows ACR consensus guidelines: White Paper of the ACR Incidental Findings Committee II on Vascular Findings. J Am Coll Radiol 2013; 10:789-794. 7.  Aortic Atherosclerosis (ICD10-I70.0). Radiology assistant personnel have been notified to put me in telephone contact with the referring physician or the referring physician's clinical representative in order to discuss these findings. Once this communication is established I will issue an addendum to this report for documentation purposes. Electronically Signed: By: Van Clines M.D. On: 10/27/2018 13:00   Ct Abdomen Pelvis W Contrast  Addendum Date: 10/27/2018   ADDENDUM REPORT: 10/27/2018 13:41 ADDENDUM: The original report was by Dr. Van Clines. The following addendum is by Dr. Van Clines: Critical  Value/emergent results were called by telephone at the time of interpretation on 10/27/2018 at 1:40 pm to Dr. Gerlean Ren , who verbally acknowledged these results.  Electronically Signed   By: Van Clines M.D.   On: 10/27/2018 13:41   Result Date: 10/27/2018 CLINICAL DATA:  Pancreatic adenocarcinoma with duodenal ulcer, chemotherapy and radiation therapy in progress. EXAM: CT CHEST, ABDOMEN, AND PELVIS WITH CONTRAST TECHNIQUE: Multidetector CT imaging of the chest, abdomen and pelvis was performed following the standard protocol during bolus administration of intravenous contrast. CONTRAST:  116mL OMNIPAQUE IOHEXOL 300 MG/ML  SOLN COMPARISON:  Multiple exams, including CT abdomen from 09/27/2018 FINDINGS: CT CHEST FINDINGS Cardiovascular: Today's exam was not performed for pulmonary arterial assessment, but there is mostly peripheral and segmental right lower lobe filling defect in the pulmonary artery compatible with pulmonary embolus, likely chronic. Similar peripheral segmental and subsegmental filling defects in the left upper lobe and left lower lobe pulmonary arteries. Right Port-A-Cath tip: Cavoatrial junction. Atherosclerotic calcification of the aortic arch and branch vessels. Mediastinum/Nodes: Hazy density in the mediastinal adipose tissue as well as the subcutaneous adipose tissue possibly from third spacing of fluid. Right lower paratracheal node 1.1 cm in short axis on image 42/7. Lungs/Pleura: Bandlike scarring or atelectasis in the left lower lobe at the site of prior airspace opacity, mildly improved although there is some continued anterior airspace opacity. Musculoskeletal: Small oblong sclerotic lesion in the left eighth rib lateral, not changed from 08/22/2018 common no cortical irregularity or cortical destruction. CT ABDOMEN PELVIS FINDINGS Hepatobiliary: Lateral segment left hepatic lobe cyst on image 88/7. Potential 4 mm hypodense lesion in the right hepatic lobe on image 102/7, technically nonspecific. Pancreas: Highly ill-defined pancreas. Indistinct stranding around the celiac trunk and superior mesenteric artery. Hypodensity favoring  pancreatic body mass measuring about 2.4 by 4.1 cm on image 108/7. Dilated dorsal pancreatic duct. Spleen: Unremarkable. Adrenals/Urinary Tract: Small hypodense renal lesions are technically too small to characterize although statistically likely to be benign. Adrenal glands unremarkable. Stomach/Bowel: Prominently dilated stomach filled with food material. Poor definition of the duodenum. I can not exclude gastric outlet obstruction. Presumed vascular coils in the gastroduodenal artery. Poor definition of bowel wall due to the diffuse edema. Vascular/Lymphatic: Mild gastric varices. Infrarenal abdominal aortic aneurysm 3.1 cm in diameter. Aneurysmal bilateral common iliac arteries. Ectatic external iliac arteries. The portal vein and SMV remain patent. Varices are noted along the omentum and stomach. Reproductive: Unremarkable Other: Diffuse subcutaneous and mesenteric edema. Musculoskeletal: Spondylosis and degenerative disc disease at L5-S1. IMPRESSION: 1. Chronic appearing bilateral pulmonary embolus most notably involving the right lower lobe. 2. Mild enlargement of the pancreatic mass. No findings of metastatic disease to the liver currently. Small cyst in the lateral segment left hepatic lobe. 3. Notable dilatation of the stomach which is filled with food. This raises the possibility of gastric outlet obstruction or narrowing along the descending duodenum. No bowel dilatation. 4. Third spacing of fluid with diffuse subcutaneous, mesenteric, and mediastinal edema. 5. Gastric and omental varices likely from portal venous hypertension. 6. Mild aneurysmal dilatation of the abdominal aorta at 3.1 cm with aneurysmal iliac arteries. Recommend followup by [place] in 3 years. This recommendation follows ACR consensus guidelines: White Paper of the ACR Incidental Findings Committee II on Vascular Findings. J Am Coll Radiol 2013; 10:789-794. 7.  Aortic Atherosclerosis (ICD10-I70.0). Radiology assistant personnel have been  notified to put me in telephone contact with the referring physician or the referring physician's clinical representative in order to discuss these  findings. Once this communication is established I will issue an addendum to this report for documentation purposes. Electronically Signed: By: Van Clines M.D. On: 10/27/2018 13:00   Dg Abd Portable 1v  Result Date: 10/29/2018 CLINICAL DATA:  Follow-up gastric catheter placement EXAM: PORTABLE ABDOMEN - 1 VIEW COMPARISON:  None. FINDINGS: Gastric catheter is noted within the stomach. Changes of prior embolus therapy are noted in the right upper quadrant. Contrast material is noted scattered throughout the colon related to the recent CT examination. Mild retained fecal material is noted. IMPRESSION: Gastric catheter within the stomach. Electronically Signed   By: Inez Catalina M.D.   On: 10/29/2018 16:34   [place] Ekg Site Rite  Result Date: 11/05/2018 If Site Rite image not attached, placement could not be confirmed due to current cardiac rhythm.  Ct Angio Abd/pel W/ And/or W/o  Result Date: 11/10/2018 CLINICAL DATA:  Pancreatic carcinoma with partial duodenal obstruction, gastric varices. GI bleed since gastrojejunostomy on 11/04/18^ EXAM: CTA ABDOMEN AND PELVIS wITHOUT AND WITH CONTRAST TECHNIQUE: Multidetector CT imaging of the abdomen and pelvis was performed using the standard protocol during bolus administration of intravenous contrast. Multiplanar reconstructed images and MIPs were obtained and reviewed to evaluate the vascular anatomy. CONTRAST:  131mL ISOVUE-370 IOPAMIDOL (ISOVUE-370) INJECTION 76% COMPARISON:  10/27/2018 FINDINGS: VASCULAR Aorta: Fusiform abdominal aortic aneurysm measured up to 3.1 cm maximum diameter, stable. No significant intraluminal thrombus. No dissection or stenosis. Celiac: Patent. The left gastric artery arises directly from the abdominal aorta, an anatomic variant. Metallic gastroduodenal embolization coils without  evidence of recanalization. SMA: Patent without evidence of aneurysm, dissection, vasculitis or significant stenosis. Renals: Both renal arteries are patent without evidence of aneurysm, dissection, vasculitis, fibromuscular dysplasia or significant stenosis. IMA: Short-segment origin stenosis. Distally patent without evidence of aneurysm, dissection, vasculitis or significant stenosis. Inflow: Fusiform dilatation of the right common iliac artery up to 2.9 cm diameter. Fusiform dilatation of left common iliac artery up to 2.7 cm diameter with some eccentric nonocclusive mural thrombus. Tortuous ectatic distal iliac arterial systems without significant stenosis. Proximal Outflow: Bilateral common femoral and visualized portions of the superficial and profunda femoral arteries are patent without evidence of aneurysm, dissection, vasculitis or significant stenosis. Veins: Apparent splenic venous occlusion with collaterals from the splenic hilum extending along the anterior mesentery. Streak artifact from GDA embolization coils obscures visualization of the portosplenic confluence. Main portal vein and intrahepatic branches are patent. Hepatic veins patent. Review of the MIP images confirms the above findings. NON-VASCULAR Lower chest: Chronic atelectasis/scarring in the left lower lobe. No pleural or pericardial effusion. Hepatobiliary: No focal liver abnormality is seen. No gallstones, gallbladder wall thickening, or biliary dilatation. Pancreas: Decreased enhancement in the mid pancreatic body. Atrophy in the pancreatic tail. Some image degradation through the pancreatic head secondary to regional embolization coils. Spleen: Normal in size without focal lesion. Adrenals/Urinary Tract: Adrenal glands are unremarkable. Kidneys are normal, without renal calculi, focal lesion, or hydronephrosis. Bladder is unremarkable. Stomach/Bowel: Stomach is nondilated. Gastrojejunostomy staple line in the lateral gastric body. No  evidence of active extravasation. Small bowel is decompressed. Moderate proximal colonic and rectal fecal material without dilatation. Lymphatic: No definite abdominal or pelvic adenopathy, evaluation limited secondary to paucity of fat and diffuse anasarca. Reproductive: Prostate is unremarkable. Other: No significant ascites. Scattered bubbles of free intraperitoneal air presumably postoperative. Musculoskeletal: Midline anterior abdominal wall skin staples. Lumbosacral spondylitic change. No fracture or worrisome bone lesion. IMPRESSION: VASCULAR 1. No evidence of active extravasation. 2. 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 3. 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 NON-VASCULAR 1. No acute findings. 2. Decreased enhancement in the mid pancreatic body suggesting residual/recurrent neoplasm. Electronically Signed   By: Lucrezia Europe M.D.   On: 11/10/2018 16:46     Antimicrobials:   NONE    Subjective: RESTING COMFORTABLY IN BED, NO NEW BLEEDING, NO ACUTE EVENTS OVERNIGHT  Objective: Vitals:   11/25/18 0930 11/25/18 0945 11/25/18 1000 11/25/18 1234  BP: (!) 129/95 116/89 122/90 123/90  Pulse: 98 96 98 97  Resp: 16 17 16 17   Temp: 98.3 F (36.8 C) 98.3 F (36.8 C) 98.4 F (36.9 C) 98.4 F (36.9 C)  TempSrc: Oral Oral Oral Oral  SpO2:    100%  Weight:      Height:        Intake/Output Summary (Last 24 hours) at 11/25/2018 1345 Last data filed at 11/25/2018 1300 Gross per 24 hour  Intake 1004 ml  Output 250 ml  Net 754 ml   Filed Weights   11/02/18 1806 11/04/18 0831 11/12/18 0742  Weight: 56.2 kg 56.2 kg 56.2 kg    Examination:  General exam: Appears calm and comfortable  Respiratory system: Clear to auscultation. Respiratory effort normal. Cardiovascular system: S1 & S2 heard, RRR. No JVD, murmurs, rubs, gallops or clicks. No pedal edema. Gastrointestinal  system: Abdomen is nondistended, soft and nontender. No organomegaly or masses felt. Normal bowel sounds heard. Central nervous system: Alert and oriented. No focal neurological deficits. Extremities: Symmetric 5 x 5 power, S/P R BKA Skin: No rashes, lesions or ulcers Psychiatry: Judgement and insight appear normal. Mood & affect appropriate.     Data Reviewed: I have personally reviewed following labs and imaging studies  CBC: Recent Labs  Lab 11/22/18 0352 11/23/18 0447 11/23/18 1042 11/24/18 0347 11/25/18 0521  WBC 11.1* 13.0* 12.4* 9.7 8.2  NEUTROABS  --   --  10.5*  --   --   HGB 8.8* 8.1* 7.7* 7.8* 7.4*  HCT 27.1* 25.7* 24.4* 24.9* 23.2*  MCV 87.7 90.5 90.7 91.2 91.3  PLT 327 285 246 227 387   Basic Metabolic Panel: Recent Labs  Lab 11/19/18 1148 11/20/18 0355 11/21/18 0428 11/22/18 0352 11/23/18 0447  NA  --  131* 133* 132* 134*  K  --  4.0 3.7 3.4* 3.4*  CL  --  96* 96* 96* 99  CO2  --  27 28 24 30   GLUCOSE  --  276* 161* 142* 209*  BUN  --  7 8 11 14   CREATININE 0.49* 0.55* 0.54* 0.47* 0.40*  CALCIUM  --  8.0* 8.3* 8.0* 8.4*   GFR: Estimated Creatinine Clearance: 81 mL/min (A) (by C-G formula based on SCr of 0.4 mg/dL (L)). Liver Function Tests: Recent Labs  Lab 11/23/18 0447  AST 28  ALT 29  ALKPHOS 84  BILITOT 0.5  PROT 6.5  ALBUMIN 1.8*   No results for input(s): LIPASE, AMYLASE in the last 168 hours. No results for input(s): AMMONIA in the last 168 hours. Coagulation Profile: No results for input(s): INR, PROTIME in the last 168 hours. Cardiac Enzymes: No results for input(s): CKTOTAL, CKMB, CKMBINDEX, TROPONINI in the last 168 hours. BNP (last 3 results) No results for input(s): PROBNP in the last 8760 hours. HbA1C: No results for input(s): HGBA1C in the last 72 hours. CBG: Recent Labs  Lab 11/24/18 1138 11/24/18 1728 11/24/18 2105 11/25/18  0815 11/25/18 1120  GLUCAP 116* 265* 212* 216* 157*   Lipid Profile: No results for  input(s): CHOL, HDL, LDLCALC, TRIG, CHOLHDL, LDLDIRECT in the last 72 hours. Thyroid Function Tests: No results for input(s): TSH, T4TOTAL, FREET4, T3FREE, THYROIDAB in the last 72 hours. Anemia Panel: Recent Labs    11/23/18 0447  FERRITIN 1,147*  TIBC 175*  IRON 12*   Sepsis Labs: No results for input(s): PROCALCITON, LATICACIDVEN in the last 168 hours.  Recent Results (from the past 240 hour(s))  Surgical pcr screen     Status: None   Collection Time: 11/18/18  2:58 PM  Result Value Ref Range Status   MRSA, PCR NEGATIVE NEGATIVE Final   Staphylococcus aureus NEGATIVE NEGATIVE Final    Comment: (NOTE) The Xpert SA Assay (FDA approved for NASAL specimens in patients 48 years of age and older), is one component of a comprehensive surveillance program. It is not intended to diagnose infection nor to guide or monitor treatment. Performed at [facility], [place] [address] [place], [place] [address]          Radiology Studies: No results found.      Scheduled Meds: . acetaminophen  1,000 mg Oral Q8H  . apixaban  5 mg Oral BID  . docusate sodium  100 mg Oral Daily  . feeding supplement (ENSURE ENLIVE)  237 mL Oral TID BM  . furosemide  20 mg Intravenous Once  . insulin aspart  0-5 Units Subcutaneous QHS  . insulin aspart  0-9 Units Subcutaneous TID WC  . pantoprazole  40 mg Oral BID  . polyethylene glycol  17 g Oral Daily  . sodium chloride flush  10-40 mL Intracatheter Q12H  . sucralfate  1 g Oral TID WC & HS   Continuous Infusions: . magnesium sulfate 1 - 4 g bolus IVPB       LOS: 83 days    Time spent: 25 MIN    Nicolette Bang, MD [facility]  If 7PM-7AM, please contact night-coverage  11/25/2018, 1:45 PM

## 2018-11-25 NOTE — Plan of Care (Signed)
  Problem: Pain Managment: Goal: General experience of comfort will improve Outcome: Progressing   Problem: Skin Integrity: Goal: Risk for impaired skin integrity will decrease Outcome: Progressing   Problem: Clinical Measurements: Goal: Ability to maintain clinical measurements within normal limits will improve Outcome: Progressing   Problem: Skin Integrity: Goal: Demonstration of wound healing without infection will improve Outcome: Progressing   Problem: Metabolic: Goal: Ability to maintain appropriate glucose levels will improve Outcome: Progressing

## 2018-11-25 NOTE — Progress Notes (Signed)
PT Cancellation Note  Patient Details Name: Ryan Medina MRN: [id number] DOB: 08/27/1961   Cancelled Treatment:    Reason Eval/Treat Not Completed: Patient at procedure or test/unavailable;Other (comment)(pt off unit for CT. PT will continue to follow acutely.)   Salina April, PTA [facility] Pager: [phone] Office: [phone]   11/25/2018, 3:21 PM

## 2018-11-25 NOTE — Progress Notes (Signed)
Pt tolerated blood transfusion well with no reaction noted or reported. VSS and pt resting comfortably in bed with call light within reach. Will closely monitor. Delia Heady RN

## 2018-11-26 LAB — TYPE AND SCREEN
ABO/RH(D): B POS
Antibody Screen: NEGATIVE
Unit division: 0
Unit division: 0
Unit division: 0

## 2018-11-26 LAB — CREATININE, SERUM
Creatinine, Ser: 0.48 mg/dL — ABNORMAL LOW (ref 0.61–1.24)
GFR calc Af Amer: >60 mL/min (ref >60)
GFR calc non Af Amer: >60 mL/min (ref >60)

## 2018-11-26 LAB — CBC
HEMATOCRIT: 30.4 % — AB (ref 39.0–52.0)
HEMOGLOBIN: 9.7 g/dL — AB (ref 13.0–17.0)
MCH: 28.6 pg (ref 26.0–34.0)
MCHC: 31.9 g/dL (ref 30.0–36.0)
MCV: 89.7 fL (ref 80.0–100.0)
Platelets: 335 10*3/uL (ref 150–400)
RBC: 3.39 MIL/uL — ABNORMAL LOW (ref 4.22–5.81)
RDW: 14.7 % (ref 11.5–15.5)
WBC: 10.2 10*3/uL (ref 4.0–10.5)
nRBC: 0 % (ref 0.0–0.2)

## 2018-11-26 LAB — BPAM RBC
Blood Product Expiration Date: 202003122359
Blood Product Expiration Date: 202003242359
Blood Product Expiration Date: 202003272359
ISSUE DATE / TIME: 202003050859
ISSUE DATE / TIME: 202003051347
ISSUE DATE / TIME: 202003051425
Unit Type and Rh: 7300
Unit Type and Rh: 7300
Unit Type and Rh: 7300

## 2018-11-26 LAB — GLUCOSE, CAPILLARY
GLUCOSE-CAPILLARY: 163 mg/dL — AB (ref 70–99)
Glucose-Capillary: 146 mg/dL — ABNORMAL HIGH (ref 70–99)
Glucose-Capillary: 339 mg/dL — ABNORMAL HIGH (ref 70–99)
Glucose-Capillary: 174 mg/dL — ABNORMAL HIGH (ref 70–99)
Glucose-Capillary: 163 mg/dL — ABNORMAL HIGH (ref 70–99)

## 2018-11-26 NOTE — Plan of Care (Signed)
  Problem: Education: Goal: Knowledge of General Education information will improve Description Including pain rating scale, medication(s)/side effects and non-pharmacologic comfort measures  Outcome: Progressing   Problem: Health Behavior/Discharge Planning: Goal: Ability to manage health-related needs will improve Outcome: Progressing   Problem: Pain Managment: Goal: General experience of comfort will improve Outcome: Progressing   Problem: Skin Integrity: Goal: Risk for impaired skin integrity will decrease Outcome: Progressing   Problem: Clinical Measurements: Goal: Ability to maintain clinical measurements within normal limits will improve Outcome: Progressing Goal: Postoperative complications will be avoided or minimized Outcome: Progressing   Problem: Skin Integrity: Goal: Demonstration of wound healing without infection will improve Outcome: Progressing   Problem: Metabolic: Goal: Ability to maintain appropriate glucose levels will improve Outcome: Progressing   Problem: Tissue Perfusion: Goal: Adequacy of tissue perfusion will improve Outcome: Progressing

## 2018-11-26 NOTE — Progress Notes (Signed)
Patient refused 2 oral 5 mg oxycodone. Medication was not open, returned 2 oral 5 mg into pyxis. No waste required, container was unopen.

## 2018-11-26 NOTE — Plan of Care (Signed)
  Problem: Tissue Perfusion: Goal: Adequacy of tissue perfusion will improve Outcome: Progressing   Problem: Skin Integrity: Goal: Risk for impaired skin integrity will decrease Outcome: Progressing   Problem: Pain Managment: Goal: General experience of comfort will improve Outcome: Progressing

## 2018-11-26 NOTE — Progress Notes (Signed)
Stump dressing changed this morning.

## 2018-11-26 NOTE — Progress Notes (Signed)
PROGRESS NOTE    Ryan Medina  ZOX:[id number] DOB: 03/08/1961 DOA: 09/03/2018 PCP: Patient, No Pcp Per   Brief Narrative:  Per admitting physician: [name] is a58 y.o.M with DM, HTN, sCHF EF 45% and polysubstance abuse/homelessness who presented initially with left leg pain on 12/13, found to have acute thromboembolic ischemic left leg. Taken emergently for embolectomy.  Post-op course complicated. Of primary importance, found to have inoperable pancreatic cancer. In brief, right leg progressively worse, ultimately gangrenous requiring BKA; difficult balance between -- recurrent thromboembolic events due to malignancy vs. severe and recurrent GIB from duodenal ulcer (erosion from malignancy), all punctuated by pneumonia sepsis, now resolved.   Assessment & Plan:   Principal Problem:   Popliteal artery embolus (HCC) Active Problems:   DM (diabetes mellitus), type 2, uncontrolled ([place])   Essential hypertension   Dilated cardiomyopathy (HCC)   Chest pain   AAA (abdominal aortic aneurysm) (HCC)   Ischemic foot   Right leg claudication (HCC)   PVD (peripheral vascular disease) ([place])   Hemorrhagic shock ([place])   Nontraumatic ischemic infarction of muscle of right lower leg   Upper GI bleed   Cocaine abuse ([place])   Acute blood loss anemia   Chronic pain syndrome   Duodenal ulcer with hemorrhage   Pancreatic cancer ([place])   Goals of care, counseling/discussion   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   Pancreatic adenocarcinoma ([place])   Palliative care by specialist   Protein-calorie malnutrition, severe   Abdominal distention   Cancer associated pain   HCAP (healthcare-associated pneumonia)   Acute lower UTI   Sepsis ([place])   Homelessness   Weakness generalized   Gastric outflow obstruction   Hematochezia   BRBPR (bright red blood per rectum)   Acute gastrojejunal anastomotic ulcer   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 No evid of new bleeding -Feraheme3/3,c/woral iron -ContinuePPI and sucralfate  Pacreatic adenoCA -Nonoperative, palliative care involved 2/2 advanced nature for sx control  Candida esophagitis - appears resolved, no sx  Pulmonary embolism GI bleeding appears stopped. Patient refused Lovenox. Discussed with Dr. Marin Olp and patient, Eliquis is a suboptimal alternative, but patient will accept. -c/wEliquis  Chronic systolic and diastolic CHF Stable,no evidence of volume overload  Diabetes Hyperglycemic -Continue withLevemir 4 QHS -Continue SSI  Sepsis from HCAP, Pseudomonas UTI Treated with Cefepime 1/11-1/16, fever resolved.  MDM and disposition: The below labs and imaging reports were reviewed and summarized above. Medication management as above. Including anticoagulation.  The patient was admitted with an ischemic leg, found to have pancreatic cancer and hypercoagulability causing multiple complications, including a prolonged gastrointestinal hemorrhage.   His GI bleeding is essentially resolved now, and he has been stable. He finally had to undergo right leg BKA due to gangrene, and he is progressing well from thisalthough vascular surgery is following closely secondary to some bleeding at wound site  The patient at this point in time will need rehabilitation from his right BKA as well as malnutrition, as well  as frequent lab monitoring.PT/OT evaluations performed.  SNF recommended. SNF appropriate as the patient has received 3 days of hospital care (or the 3 day period has been waived by the patient's insurance company) and is felt to need rehab services to restore this patient to their prior level of function to achieve safe transition back to home care. This patient needs rehab services for at least 5 days per week and skilled nursing services daily to facilitate this transition. Rehab is being requested as the most appropriate d/c option for this patient and is NOT felt to be for custodial care as evidenced bypatient's ability to ambulate as well as perform ADLs prior to admission  DVT prophylaxis: JM:EQASTMH  Code Status: partial    Code Status Orders  (From admission, onward)         Start     Ordered   09/07/18 1850  Limited resuscitation (code)  Continuous    Question Answer Comment  In the event of cardiac or respiratory ARREST: Initiate Code Blue, Call Rapid Response No   In the event of cardiac or respiratory ARREST: Perform CPR No   In the event of cardiac or respiratory ARREST: Perform Intubation/Mechanical Ventilation Yes   In the event of cardiac or respiratory ARREST: Use NIPPV/BiPAp only if indicated No   In the event of cardiac or respiratory ARREST: Administer ACLS medications if indicated No   In the event of cardiac or respiratory ARREST: Perform Defibrillation or Cardioversion if indicated No   Comments Continue current care with mechanical ventilation and vasopressor support.      09/07/18 1850        Code Status History    Date Active Date Inactive Code Status Order ID Comments User Context   09/03/2018 2251 09/07/2018 1850 Full Code [id number]  Ryan Patience, MD Inpatient   09/03/2018 2236 09/03/2018 2251 Full Code [id number]  Ryan Medina Inpatient   08/22/2018 2203 08/25/2018 1655 Full Code [id number]  Ryan Costa, MD ED   01/30/2018 0308 01/31/2018  1807 Full Code [id number]  Ryan Patience, MD ED   05/19/2016 1029 05/19/2016 1721 Full Code [id number]  Ryan Man, MD Inpatient     Family Communication: none present  Disposition Plan:   pending snf Consults called: none today Admission status: Inpatient   Consultants:   Cardiology, general surgery, wound care, palliative care,  Procedures:  Dg Abd 1 View  Result Date: 11/07/2018 CLINICAL DATA:  Abdominal pain. EXAM: ABDOMEN - 1 VIEW COMPARISON:  October 29, 2018 FINDINGS: Moderate fecal loading in the colon. No evidence of bowel obstruction. There is a paucity of small bowel gas but no evidence of obstruction. No other acute abnormalities. IMPRESSION: Moderate fecal loading in the colon. No other acute abnormalities noted. Electronically Signed   By: Dorise Bullion III M.D   On: 11/07/2018 18:37   Ct Chest W Contrast  Addendum Date: 10/27/2018   ADDENDUM REPORT: 10/27/2018 13:41 ADDENDUM: The original report was by Dr. Van Clines. The following addendum is by Dr. Van Clines: Critical Value/emergent results were called by telephone at the time of interpretation on 10/27/2018 at 1:40 pm to Dr. Gerlean Ren , who verbally acknowledged these results. Electronically Signed   By: Van Clines M.D.   On: 10/27/2018 13:41   Result Date: 10/27/2018 CLINICAL DATA:  Pancreatic adenocarcinoma with duodenal ulcer, chemotherapy and radiation therapy in progress. EXAM: CT CHEST, ABDOMEN, AND PELVIS WITH CONTRAST TECHNIQUE: Multidetector CT imaging of the chest, abdomen and pelvis was performed following the standard protocol during  bolus administration of intravenous contrast. CONTRAST:  126mL OMNIPAQUE IOHEXOL 300 MG/ML  SOLN COMPARISON:  Multiple exams, including CT abdomen from 09/27/2018 FINDINGS: CT CHEST FINDINGS Cardiovascular: Today's exam was not performed for pulmonary arterial assessment, but there is mostly peripheral and segmental right lower lobe filling defect in the  pulmonary artery compatible with pulmonary embolus, likely chronic. Similar peripheral segmental and subsegmental filling defects in the left upper lobe and left lower lobe pulmonary arteries. Right Port-A-Cath tip: Cavoatrial junction. Atherosclerotic calcification of the aortic arch and branch vessels. Mediastinum/Nodes: Hazy density in the mediastinal adipose tissue as well as the subcutaneous adipose tissue possibly from third spacing of fluid. Right lower paratracheal node 1.1 cm in short axis on image 42/7. Lungs/Pleura: Bandlike scarring or atelectasis in the left lower lobe at the site of prior airspace opacity, mildly improved although there is some continued anterior airspace opacity. Musculoskeletal: Small oblong sclerotic lesion in the left eighth rib lateral, not changed from 08/22/2018 common no cortical irregularity or cortical destruction. CT ABDOMEN PELVIS FINDINGS Hepatobiliary: Lateral segment left hepatic lobe cyst on image 88/7. Potential 4 mm hypodense lesion in the right hepatic lobe on image 102/7, technically nonspecific. Pancreas: Highly ill-defined pancreas. Indistinct stranding around the celiac trunk and superior mesenteric artery. Hypodensity favoring pancreatic body mass measuring about 2.4 by 4.1 cm on image 108/7. Dilated dorsal pancreatic duct. Spleen: Unremarkable. Adrenals/Urinary Tract: Small hypodense renal lesions are technically too small to characterize although statistically likely to be benign. Adrenal glands unremarkable. Stomach/Bowel: Prominently dilated stomach filled with food material. Poor definition of the duodenum. I can not exclude gastric outlet obstruction. Presumed vascular coils in the gastroduodenal artery. Poor definition of bowel wall due to the diffuse edema. Vascular/Lymphatic: Mild gastric varices. Infrarenal abdominal aortic aneurysm 3.1 cm in diameter. Aneurysmal bilateral common iliac arteries. Ectatic external iliac arteries. The portal vein and SMV  remain patent. Varices are noted along the omentum and stomach. Reproductive: Unremarkable Other: Diffuse subcutaneous and mesenteric edema. Musculoskeletal: Spondylosis and degenerative disc disease at L5-S1. IMPRESSION: 1. Chronic appearing bilateral pulmonary embolus most notably involving the right lower lobe. 2. Mild enlargement of the pancreatic mass. No findings of metastatic disease to the liver currently. Small cyst in the lateral segment left hepatic lobe. 3. Notable dilatation of the stomach which is filled with food. This raises the possibility of gastric outlet obstruction or narrowing along the descending duodenum. No bowel dilatation. 4. Third spacing of fluid with diffuse subcutaneous, mesenteric, and mediastinal edema. 5. Gastric and omental varices likely from portal venous hypertension. 6. Mild aneurysmal dilatation of the abdominal aorta at 3.1 cm with aneurysmal iliac arteries. Recommend followup by [place] in 3 years. This recommendation follows ACR consensus guidelines: White Paper of the ACR Incidental Findings Committee II on Vascular Findings. J Am Coll Radiol 2013; 10:789-794. 7.  Aortic Atherosclerosis (ICD10-I70.0). Radiology assistant personnel have been notified to put me in telephone contact with the referring physician or the referring physician's clinical representative in order to discuss these findings. Once this communication is established I will issue an addendum to this report for documentation purposes. Electronically Signed: By: Van Clines M.D. On: 10/27/2018 13:00   Ct Abdomen Pelvis W Contrast  Result Date: 11/25/2018 CLINICAL DATA:  Inpatient. Nonoperable malignant-appearing pancreatic mass diagnosed December 2019. Restaging. EXAM: CT ABDOMEN AND PELVIS WITH CONTRAST TECHNIQUE: Multidetector CT imaging of the abdomen and pelvis was performed using the standard protocol following bolus administration of intravenous contrast. CONTRAST:  137mL OMNIPAQUE IOHEXOL 300  MG/ML  SOLN COMPARISON:  11/10/2018 CT angiogram of the abdomen and pelvis. FINDINGS: Lower chest: No significant pulmonary nodules or acute consolidative airspace disease. Stable mildly thickened parenchymal band at the left lung base compatible with postinfectious/postinflammatory scarring. Tip of superior approach Port-A-Cath is seen at the cavoatrial junction. Hepatobiliary: Normal liver size. Simple 1.0 cm lateral segment left liver lobe cyst is stable. No new liver lesions. Normal gallbladder with no radiopaque cholelithiasis. No biliary ductal dilatation. Pancreas: Poorly marginated heterogeneous hypoenhancing 5.5 x 4.0 cm pancreatic body mass (series 3/image 24), previously 5.3 x 3.9 cm on 11/10/2018 using similar measurement technique, stable to minimally increased. Stable atrophy and duct dilation in the pancreatic tail. Stable encasement of the celiac trunk by the mass. At least partial anterior encasement of the proximal SMA by the mass. Spleen: Normal size. No mass. Adrenals/Urinary Tract: Normal adrenals. Nonobstructing 3 mm lower right renal stone. Stable scattered subcentimeter hypodense renal cortical lesions in both kidneys. No new renal lesions. No hydronephrosis. Normal bladder. Stomach/Bowel: Stomach is nondistended. Stable gastric fold thickening in the fundus and proximal body of the stomach. Normal caliber small bowel with no small bowel wall thickening. Appendectomy. Oral contrast transits to the left colon. Mild-to-moderate colonic stool with no large bowel wall thickening, colonic diverticulosis or significant pericolonic fat stranding. Vascular/Lymphatic: Atherosclerotic abdominal aorta with stable 3.3 cm infrarenal abdominal aortic aneurysm. 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 Stable 2.9 cm left common iliac artery aneurysm. Patent hepatic, portal and renal veins. Stable occlusion of splenic vein at the level of the pancreatic tail. Redemonstration of embolization coils in  the gastroduodenal artery. Stable nonocclusive thrombosis of the right common femoral vein. Mildly enlarged 1.3 cm right external iliac node (series 3/image 71), increased from 1.0 cm. No additional pathologically enlarged lymph nodes in the abdomen or pelvis. Reproductive: Normal size prostate. Other: No pneumoperitoneum, ascites or focal fluid collection. Progressive cachexia, particularly compared to 12/19/2017 CT study. Musculoskeletal: No aggressive appearing focal osseous lesions. Moderate lower lumbar spondylosis. IMPRESSION: 1. Locally advanced 5.5 cm pancreatic body mass is stable to slightly increased since 11/10/2018 CT. 2. Mild right external iliac lymphadenopathy is mildly increased, nonspecific, could be reactive or metastatic. 3. No additional potential sites of metastatic disease. 4. Stable 3.3 cm Abdominal Aortic Aneurysm (ICD10-I71.9). Stable bilateral common iliac artery aneurysms. 5. Stable nonocclusive DVT in the right common femoral vein. 6. Progressive cachexia. Electronically Signed   By: Ilona Sorrel M.D.   On: 11/25/2018 16:52   Ct Abdomen Pelvis W Contrast  Addendum Date: 10/27/2018   ADDENDUM REPORT: 10/27/2018 13:41 ADDENDUM: The original report was by Dr. Van Clines. The following addendum is by Dr. Van Clines: Critical Value/emergent results were called by telephone at the time of interpretation on 10/27/2018 at 1:40 pm to Dr. Gerlean Ren , who verbally acknowledged these results. Electronically Signed   By: Van Clines M.D.   On: 10/27/2018 13:41   Result Date: 10/27/2018 CLINICAL DATA:  Pancreatic adenocarcinoma with duodenal ulcer, chemotherapy and radiation therapy in progress. EXAM: CT CHEST, ABDOMEN, AND PELVIS WITH CONTRAST TECHNIQUE: Multidetector CT imaging of the chest, abdomen and pelvis was performed following the standard protocol during bolus administration of intravenous contrast. CONTRAST:  151mL OMNIPAQUE IOHEXOL 300 MG/ML  SOLN COMPARISON:   Multiple exams, including CT abdomen from 09/27/2018 FINDINGS: CT CHEST FINDINGS Cardiovascular: Today's exam was not performed for pulmonary arterial assessment, but there is mostly peripheral and segmental right lower lobe filling defect in the pulmonary artery compatible  with pulmonary embolus, likely chronic. Similar peripheral segmental and subsegmental filling defects in the left upper lobe and left lower lobe pulmonary arteries. Right Port-A-Cath tip: Cavoatrial junction. Atherosclerotic calcification of the aortic arch and branch vessels. Mediastinum/Nodes: Hazy density in the mediastinal adipose tissue as well as the subcutaneous adipose tissue possibly from third spacing of fluid. Right lower paratracheal node 1.1 cm in short axis on image 42/7. Lungs/Pleura: Bandlike scarring or atelectasis in the left lower lobe at the site of prior airspace opacity, mildly improved although there is some continued anterior airspace opacity. Musculoskeletal: Small oblong sclerotic lesion in the left eighth rib lateral, not changed from 08/22/2018 common no cortical irregularity or cortical destruction. CT ABDOMEN PELVIS FINDINGS Hepatobiliary: Lateral segment left hepatic lobe cyst on image 88/7. Potential 4 mm hypodense lesion in the right hepatic lobe on image 102/7, technically nonspecific. Pancreas: Highly ill-defined pancreas. Indistinct stranding around the celiac trunk and superior mesenteric artery. Hypodensity favoring pancreatic body mass measuring about 2.4 by 4.1 cm on image 108/7. Dilated dorsal pancreatic duct. Spleen: Unremarkable. Adrenals/Urinary Tract: Small hypodense renal lesions are technically too small to characterize although statistically likely to be benign. Adrenal glands unremarkable. Stomach/Bowel: Prominently dilated stomach filled with food material. Poor definition of the duodenum. I can not exclude gastric outlet obstruction. Presumed vascular coils in the gastroduodenal artery. Poor  definition of bowel wall due to the diffuse edema. Vascular/Lymphatic: Mild gastric varices. Infrarenal abdominal aortic aneurysm 3.1 cm in diameter. Aneurysmal bilateral common iliac arteries. Ectatic external iliac arteries. The portal vein and SMV remain patent. Varices are noted along the omentum and stomach. Reproductive: Unremarkable Other: Diffuse subcutaneous and mesenteric edema. Musculoskeletal: Spondylosis and degenerative disc disease at L5-S1. IMPRESSION: 1. Chronic appearing bilateral pulmonary embolus most notably involving the right lower lobe. 2. Mild enlargement of the pancreatic mass. No findings of metastatic disease to the liver currently. Small cyst in the lateral segment left hepatic lobe. 3. Notable dilatation of the stomach which is filled with food. This raises the possibility of gastric outlet obstruction or narrowing along the descending duodenum. No bowel dilatation. 4. Third spacing of fluid with diffuse subcutaneous, mesenteric, and mediastinal edema. 5. Gastric and omental varices likely from portal venous hypertension. 6. Mild aneurysmal dilatation of the abdominal aorta at 3.1 cm with aneurysmal iliac arteries. Recommend followup by [place] in 3 years. This recommendation follows ACR consensus guidelines: White Paper of the ACR Incidental Findings Committee II on Vascular Findings. J Am Coll Radiol 2013; 10:789-794. 7.  Aortic Atherosclerosis (ICD10-I70.0). Radiology assistant personnel have been notified to put me in telephone contact with the referring physician or the referring physician's clinical representative in order to discuss these findings. Once this communication is established I will issue an addendum to this report for documentation purposes. Electronically Signed: By: Van Clines M.D. On: 10/27/2018 13:00   Dg Abd Portable 1v  Result Date: 10/29/2018 CLINICAL DATA:  Follow-up gastric catheter placement EXAM: PORTABLE ABDOMEN - 1 VIEW COMPARISON:  None. FINDINGS:  Gastric catheter is noted within the stomach. Changes of prior embolus therapy are noted in the right upper quadrant. Contrast material is noted scattered throughout the colon related to the recent CT examination. Mild retained fecal material is noted. IMPRESSION: Gastric catheter within the stomach. Electronically Signed   By: Inez Catalina M.D.   On: 10/29/2018 16:34   [place] Ekg Site Rite  Result Date: 11/05/2018 If Site Rite image not attached, placement could not be confirmed due to current cardiac rhythm.  Ct Angio Abd/pel W/ And/or W/o  Result Date: 11/10/2018 CLINICAL DATA:  Pancreatic carcinoma with partial duodenal obstruction, gastric varices. GI bleed since gastrojejunostomy on 11/04/18^ EXAM: CTA ABDOMEN AND PELVIS wITHOUT AND WITH CONTRAST TECHNIQUE: Multidetector CT imaging of the abdomen and pelvis was performed using the standard protocol during bolus administration of intravenous contrast. Multiplanar reconstructed images and MIPs were obtained and reviewed to evaluate the vascular anatomy. CONTRAST:  193mL ISOVUE-370 IOPAMIDOL (ISOVUE-370) INJECTION 76% COMPARISON:  10/27/2018 FINDINGS: VASCULAR Aorta: Fusiform abdominal aortic aneurysm measured up to 3.1 cm maximum diameter, stable. No significant intraluminal thrombus. No dissection or stenosis. Celiac: Patent. The left gastric artery arises directly from the abdominal aorta, an anatomic variant. Metallic gastroduodenal embolization coils without evidence of recanalization. SMA: Patent without evidence of aneurysm, dissection, vasculitis or significant stenosis. Renals: Both renal arteries are patent without evidence of aneurysm, dissection, vasculitis, fibromuscular dysplasia or significant stenosis. IMA: Short-segment origin stenosis. Distally patent without evidence of aneurysm, dissection, vasculitis or significant stenosis. Inflow: Fusiform dilatation of the right common iliac artery up to 2.9 cm diameter. Fusiform dilatation of left  common iliac artery up to 2.7 cm diameter with some eccentric nonocclusive mural thrombus. Tortuous ectatic distal iliac arterial systems without significant stenosis. Proximal Outflow: Bilateral common femoral and visualized portions of the superficial and profunda femoral arteries are patent without evidence of aneurysm, dissection, vasculitis or significant stenosis. Veins: Apparent splenic venous occlusion with collaterals from the splenic hilum extending along the anterior mesentery. Streak artifact from GDA embolization coils obscures visualization of the portosplenic confluence. Main portal vein and intrahepatic branches are patent. Hepatic veins patent. Review of the MIP images confirms the above findings. NON-VASCULAR Lower chest: Chronic atelectasis/scarring in the left lower lobe. No pleural or pericardial effusion. Hepatobiliary: No focal liver abnormality is seen. No gallstones, gallbladder wall thickening, or biliary dilatation. Pancreas: Decreased enhancement in the mid pancreatic body. Atrophy in the pancreatic tail. Some image degradation through the pancreatic head secondary to regional embolization coils. Spleen: Normal in size without focal lesion. Adrenals/Urinary Tract: Adrenal glands are unremarkable. Kidneys are normal, without renal calculi, focal lesion, or hydronephrosis. Bladder is unremarkable. Stomach/Bowel: Stomach is nondilated. Gastrojejunostomy staple line in the lateral gastric body. No evidence of active extravasation. Small bowel is decompressed. Moderate proximal colonic and rectal fecal material without dilatation. Lymphatic: No definite abdominal or pelvic adenopathy, evaluation limited secondary to paucity of fat and diffuse anasarca. Reproductive: Prostate is unremarkable. Other: No significant ascites. Scattered bubbles of free intraperitoneal air presumably postoperative. Musculoskeletal: Midline anterior abdominal wall skin staples. Lumbosacral spondylitic change. No  fracture or worrisome bone lesion. IMPRESSION: VASCULAR 1. No evidence of active extravasation. 2. 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 3. 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 NON-VASCULAR 1. No acute findings. 2. Decreased enhancement in the mid pancreatic body suggesting residual/recurrent neoplasm. Electronically Signed   By: Lucrezia Europe M.D.   On: 11/10/2018 16:46     Antimicrobials:   none    Subjective: Did well ovenright, dressing change on stump this am, no comlications  Objective: Vitals:   11/25/18 1733 11/25/18 2024 11/25/18 2240 11/26/18 0340  BP: (!) 131/92 132/90  115/77  Pulse: (!) 105 98  (!) 101  Resp: 20 17  17   Temp: 98.3 F (36.8 C) (!) 97.4 F (36.3 C) 98.5 F (36.9 C) 99.8 F (37.7 C)  TempSrc: Oral Oral  Oral  SpO2:  100% 100%  100%  Weight:      Height:        Intake/Output Summary (Last 24 hours) at 11/26/2018 1053 Last data filed at 11/26/2018 0151 Gross per 24 hour  Intake 1214.17 ml  Output 2350 ml  Net -1135.83 ml   Filed Weights   11/02/18 1806 11/04/18 0831 11/12/18 0742  Weight: 56.2 kg 56.2 kg 56.2 kg    Examination:  General exam: Appears calm and comfortable  Respiratory system: Clear to auscultation. Respiratory effort normal. Cardiovascular system: S1 & S2 heard, RRR. No JVD, murmurs, rubs, gallops or clicks. No pedal edema. Gastrointestinal system: Abdomen is nondistended, soft and nontender. No organomegaly or masses felt. Normal bowel sounds heard. Central nervous system: Alert and oriented. No focal neurological deficits. Extremities: Symmetric 5 x 5, s/p bka, no bleed thru Skin: No rashes, lesions or ulcers Psychiatry: Judgement and insight appear normal. Mood & affect appropriate.     Data Reviewed: I have personally reviewed following labs and imaging studies  CBC: Recent Labs  Lab 11/23/18 0447  11/23/18 1042 11/24/18 0347 11/25/18 0521 11/26/18 0249  WBC 13.0* 12.4* 9.7 8.2 10.2  NEUTROABS  --  10.5*  --   --   --   HGB 8.1* 7.7* 7.8* 7.4* 9.7*  HCT 25.7* 24.4* 24.9* 23.2* 30.4*  MCV 90.5 90.7 91.2 91.3 89.7  PLT 285 246 227 256 478   Basic Metabolic Panel: Recent Labs  Lab 11/20/18 0355 11/21/18 0428 11/22/18 0352 11/23/18 0447 11/26/18 0249  NA 131* 133* 132* 134*  --   K 4.0 3.7 3.4* 3.4*  --   CL 96* 96* 96* 99  --   CO2 27 28 24 30   --   GLUCOSE 276* 161* 142* 209*  --   BUN 7 8 11 14   --   CREATININE 0.55* 0.54* 0.47* 0.40* 0.48*  CALCIUM 8.0* 8.3* 8.0* 8.4*  --    GFR: Estimated Creatinine Clearance: 81 mL/min (A) (by C-G formula based on SCr of 0.48 mg/dL (L)). Liver Function Tests: Recent Labs  Lab 11/23/18 0447  AST 28  ALT 29  ALKPHOS 84  BILITOT 0.5  PROT 6.5  ALBUMIN 1.8*   No results for input(s): LIPASE, AMYLASE in the last 168 hours. No results for input(s): AMMONIA in the last 168 hours. Coagulation Profile: No results for input(s): INR, PROTIME in the last 168 hours. Cardiac Enzymes: No results for input(s): CKTOTAL, CKMB, CKMBINDEX, TROPONINI in the last 168 hours. BNP (last 3 results) No results for input(s): PROBNP in the last 8760 hours. HbA1C: No results for input(s): HGBA1C in the last 72 hours. CBG: Recent Labs  Lab 11/25/18 1707 11/25/18 1951 11/25/18 2046 11/25/18 2139 11/26/18 0756  GLUCAP 264* 49* 153* 182* 146*   Lipid Profile: No results for input(s): CHOL, HDL, LDLCALC, TRIG, CHOLHDL, LDLDIRECT in the last 72 hours. Thyroid Function Tests: No results for input(s): TSH, T4TOTAL, FREET4, T3FREE, THYROIDAB in the last 72 hours. Anemia Panel: No results for input(s): VITAMINB12, FOLATE, FERRITIN, TIBC, IRON, RETICCTPCT in the last 72 hours. Sepsis Labs: No results for input(s): PROCALCITON, LATICACIDVEN in the last 168 hours.  Recent Results (from the past 240 hour(s))  Surgical pcr screen     Status: None     Collection Time: 11/18/18  2:58 PM  Result Value Ref Range Status   MRSA, PCR NEGATIVE NEGATIVE Final   Staphylococcus aureus NEGATIVE NEGATIVE Final    Comment: (NOTE) The Xpert SA Assay (FDA approved for  NASAL specimens in patients 55 years of age and older), is one component of a comprehensive surveillance program. It is not intended to diagnose infection nor to guide or monitor treatment. Performed at [facility], [place] [address], [place], [place] [address]          Radiology Studies: Ct Abdomen Pelvis W Contrast  Result Date: 11/25/2018 CLINICAL DATA:  Inpatient. Nonoperable malignant-appearing pancreatic mass diagnosed December 2019. Restaging. EXAM: CT ABDOMEN AND PELVIS WITH CONTRAST TECHNIQUE: Multidetector CT imaging of the abdomen and pelvis was performed using the standard protocol following bolus administration of intravenous contrast. CONTRAST:  168mL OMNIPAQUE IOHEXOL 300 MG/ML  SOLN COMPARISON:  11/10/2018 CT angiogram of the abdomen and pelvis. FINDINGS: Lower chest: No significant pulmonary nodules or acute consolidative airspace disease. Stable mildly thickened parenchymal band at the left lung base compatible with postinfectious/postinflammatory scarring. Tip of superior approach Port-A-Cath is seen at the cavoatrial junction. Hepatobiliary: Normal liver size. Simple 1.0 cm lateral segment left liver lobe cyst is stable. No new liver lesions. Normal gallbladder with no radiopaque cholelithiasis. No biliary ductal dilatation. Pancreas: Poorly marginated heterogeneous hypoenhancing 5.5 x 4.0 cm pancreatic body mass (series 3/image 24), previously 5.3 x 3.9 cm on 11/10/2018 using similar measurement technique, stable to minimally increased. Stable atrophy and duct dilation in the pancreatic tail. Stable encasement of the celiac trunk by the mass. At least partial anterior encasement of the proximal SMA by the mass. Spleen: Normal size. No mass. Adrenals/Urinary Tract:  Normal adrenals. Nonobstructing 3 mm lower right renal stone. Stable scattered subcentimeter hypodense renal cortical lesions in both kidneys. No new renal lesions. No hydronephrosis. Normal bladder. Stomach/Bowel: Stomach is nondistended. Stable gastric fold thickening in the fundus and proximal body of the stomach. Normal caliber small bowel with no small bowel wall thickening. Appendectomy. Oral contrast transits to the left colon. Mild-to-moderate colonic stool with no large bowel wall thickening, colonic diverticulosis or significant pericolonic fat stranding. Vascular/Lymphatic: Atherosclerotic abdominal aorta with stable 3.3 cm infrarenal abdominal aortic aneurysm. 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 Stable 2.9 cm left common iliac artery aneurysm. Patent hepatic, portal and renal veins. Stable occlusion of splenic vein at the level of the pancreatic tail. Redemonstration of embolization coils in the gastroduodenal artery. Stable nonocclusive thrombosis of the right common femoral vein. Mildly enlarged 1.3 cm right external iliac node (series 3/image 71), increased from 1.0 cm. No additional pathologically enlarged lymph nodes in the abdomen or pelvis. Reproductive: Normal size prostate. Other: No pneumoperitoneum, ascites or focal fluid collection. Progressive cachexia, particularly compared to 12/19/2017 CT study. Musculoskeletal: No aggressive appearing focal osseous lesions. Moderate lower lumbar spondylosis. IMPRESSION: 1. Locally advanced 5.5 cm pancreatic body mass is stable to slightly increased since 11/10/2018 CT. 2. Mild right external iliac lymphadenopathy is mildly increased, nonspecific, could be reactive or metastatic. 3. No additional potential sites of metastatic disease. 4. Stable 3.3 cm Abdominal Aortic Aneurysm (ICD10-I71.9). Stable bilateral common iliac artery aneurysms. 5. Stable nonocclusive DVT in the right common femoral vein. 6. Progressive cachexia. Electronically  Signed   By: Ilona Sorrel M.D.   On: 11/25/2018 16:52        Scheduled Meds: . acetaminophen  1,000 mg Oral Q8H  . apixaban  5 mg Oral BID  . docusate sodium  100 mg Oral Daily  . feeding supplement (ENSURE ENLIVE)  237 mL Oral TID BM  . insulin aspart  0-5 Units Subcutaneous QHS  . insulin aspart  0-9 Units Subcutaneous TID  WC  . pantoprazole  40 mg Oral BID  . polyethylene glycol  17 g Oral Daily  . sodium chloride flush  10-40 mL Intracatheter Q12H  . sucralfate  1 g Oral TID WC & HS   Continuous Infusions: . magnesium sulfate 1 - 4 g bolus IVPB       LOS: 84 days    Time spent: 25    Nicolette Bang, MD [facility]  If 7PM-7AM, please contact night-coverage  11/26/2018, 10:53 AM

## 2018-11-26 NOTE — Progress Notes (Signed)
VASCULAR SURGERY ASSESSMENT & PLAN:   7 Days Post-Op s/p: Right below the knee amputation.  He had a small amount of drainage on the lateral aspect of his incision and therefore I elected to take out 2 staples and probed this area with a Q-tip.  There was some old bloody drainage and I packed this with a 4 x 4.  I suspect this will drain some which is fine.  I have written for dressing changes twice daily.  If he is discharged over the weekend I can follow this as an outpatient.  Otherwise I will check back on Monday.  If there are any problems on the weekend Dr. Carlis Abbott is on call.    SUBJECTIVE:   No complaints.  PHYSICAL EXAM:   Vitals:   11/25/18 2024 11/25/18 2240 11/26/18 0340 11/26/18 1347  BP: 132/90  115/77 116/88  Pulse: 98  (!) 101 77  Resp: 17  17 18   Temp: (!) 97.4 F (36.3 C) 98.5 F (36.9 C) 99.8 F (37.7 C) 98.5 F (36.9 C)  TempSrc: Oral  Oral Oral  SpO2: 100%  100% 100%  Weight:      Height:       His left BKA is healing well except he had some drainage on the lateral aspect of the wound so I elected to remove 2 staples.  He has some old bloody drainage here and I packed this area.  LABS:   Lab Results  Component Value Date   WBC 10.2 11/26/2018   HGB 9.7 (L) 11/26/2018   HCT 30.4 (L) 11/26/2018   MCV 89.7 11/26/2018   PLT 335 11/26/2018   Lab Results  Component Value Date   CREATININE 0.48 (L) 11/26/2018   Lab Results  Component Value Date   INR 1.31 11/02/2018   CBG (last 3)  Recent Labs    11/25/18 2139 11/26/18 0756 11/26/18 1148  GLUCAP 182* 146* 339*    PROBLEM LIST:    Principal Problem:   Popliteal artery embolus (HCC) Active Problems:   DM (diabetes mellitus), type 2, uncontrolled (HCC)   Essential hypertension   Dilated cardiomyopathy (HCC)   Chest pain   AAA (abdominal aortic aneurysm) (HCC)   Ischemic foot   Right leg claudication (HCC)   PVD (peripheral vascular disease) ([place])   Hemorrhagic shock ([place])  Nontraumatic ischemic infarction of muscle of right lower leg   Upper GI bleed   Cocaine abuse ([place])   Acute blood loss anemia   Chronic pain syndrome   Duodenal ulcer with hemorrhage   Pancreatic cancer ([place])   Goals of care, counseling/discussion   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 acetaminophen  1,000 mg Oral Q8H  . apixaban  5 mg Oral BID  . docusate sodium  100 mg Oral Daily  . feeding supplement (ENSURE ENLIVE)  237 mL Oral TID BM  . insulin aspart  0-5 Units Subcutaneous QHS  . insulin aspart  0-9 Units Subcutaneous TID WC  . pantoprazole  40 mg  Oral BID  . polyethylene glycol  17 g Oral Daily  . sodium chloride flush  10-40 mL Intracatheter Q12H  . sucralfate  1 g Oral TID WC & HS    [name] Beeper: [phone] Office: [phone] 11/26/2018

## 2018-11-26 NOTE — Progress Notes (Signed)
Physical Therapy Treatment Patient Details Name: Ryan Medina MRN: [id number] DOB: 05-11-1961 Today's Date: 11/26/2018    History of Present Illness Pt is a 58 y.o. male with DM, HTN, sCHF EF 45% and polysubstance abuse/homelessness. He presented initially with left leg pain on 12/13, found to have acute thromboembolic ischemic left leg.  He was taken emergently for embolectomy.  Post-op course complicated.  Of primary importance, found to have inoperable pancreatic cancer.  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     PT Comments    Patient seen for mobility progression. Pt declined OOB mobility at this time but eager to work on LE therex and stretching to promote R knee extension. Pt educated on LE exercises in supine and side lying and positioning reviewed. Pt does present with improving R knee extension since last PT session.  Continue to progress as tolerated.    Follow Up Recommendations  SNF     Equipment Recommendations  Other (comment)(defer to next venue)    Recommendations for Other Services       Precautions / Restrictions Precautions Precautions: Fall Precaution Comments: Pt reports fall overnight 2/28 trying to use urinal. Required Braces or Orthoses: Other Brace Restrictions Weight Bearing Restrictions: Yes RUE Weight Bearing: Non weight bearing RLE Weight Bearing: Non weight bearing    Mobility  Bed Mobility Overal bed mobility: Independent                Transfers Overall transfer level: Needs assistance Equipment used: None Transfers: Sit to/from [facility] Sit to Stand: Min guard Stand pivot transfers: Min guard       General transfer comment: refusing to follow inst. for hand placement and safety during pivot  Ambulation/Gait                 Stairs             Wheelchair Mobility    Modified Rankin (Stroke Patients Only)       Balance                                             Cognition Arousal/Alertness: Awake/alert Behavior During Therapy: Restless;WFL for tasks assessed/performed Overall Cognitive Status: Within Functional Limits for tasks assessed                                 General Comments: fluctuating between rational behavior, conversational and joking. but would get very angry if he was told something he did not want to hear or when instructions are provided.       Exercises Amputee Exercises Quad Sets: AROM;Right;Supine Towel Squeeze: AROM;Supine Hip Extension: AROM;Right;Sidelying;Other (comment)(assistance at pelvis and for decreased knee flexion) Hip ABduction/ADduction: AROM;Right;Supine Hip Flexion/Marching: AROM;Right;Supine Knee Extension: AROM;Right;Supine Straight Leg Raises: AROM;Right;Supine;Other (comment)(cues for quad activation prior to lifting) Other Exercises Other Exercises: passive R LE stretches to promote increased knee extension    General Comments General comments (skin integrity, edema, etc.): pt declined OOB mobility       Pertinent Vitals/Pain Pain Assessment: Faces Faces Pain Scale: Hurts even more Pain Location: R residual limb Pain Descriptors / Indicators: Grimacing;Guarding;Aching Pain Intervention(s): Limited activity within patient's tolerance;Monitored during session;Repositioned;Premedicated before session;Patient requesting pain meds-RN notified    Home Living  Prior Function            PT Goals (current goals can now be found in the care plan section) Acute Rehab PT Goals Patient Stated Goal: decrease pain Progress towards PT goals: Progressing toward goals    Frequency    Min 3X/week      PT Plan Current plan remains appropriate    Co-evaluation              AM-PAC PT "6 Clicks" Mobility   Outcome Measure  Help needed turning from your back to your side while in a flat bed without using  bedrails?: None Help needed moving from lying on your back to sitting on the side of a flat bed without using bedrails?: None Help needed moving to and from a bed to a chair (including a wheelchair)?: A Little Help needed standing up from a chair using your arms (e.g., wheelchair or bedside chair)?: A Little Help needed to walk in hospital room?: A Little Help needed climbing 3-5 steps with a railing? : A Lot 6 Click Score: 19    End of Session   Activity Tolerance: Patient tolerated treatment well;Other (comment)(breaks due to pain with therex) Patient left: with call bell/phone within reach;in bed;with bed alarm set Nurse Communication: Mobility status;Patient requests pain meds PT Visit Diagnosis: Other abnormalities of gait and mobility (R26.89);Difficulty in walking, not elsewhere classified (R26.2);Pain;Muscle weakness (generalized) (M62.81) Pain - Right/Left: Right Pain - part of body: Leg     Time: 5697-9480 PT Time Calculation (min) (ACUTE ONLY): 21 min  Charges:  $Therapeutic Exercise: 8-22 mins                     Earney Navy, PTA Acute Rehabilitation Services Pager: [phone] Office: [phone]     Darliss Cheney 11/26/2018, 1:55 PM

## 2018-11-26 NOTE — Progress Notes (Signed)
Occupational Therapy Treatment Patient Details Name: Ryan Medina MRN: [id number] DOB: [birth year] Today's Date: 11/26/2018    History of present illness Pt is a 58 y.o. male with DM, HTN, sCHF EF 45% and polysubstance abuse/homelessness. He presented initially with left leg pain on 12/13, found to have acute thromboembolic ischemic left leg.  He was taken emergently for embolectomy.  Post-op course complicated.  Of primary importance, found to have inoperable pancreatic cancer.  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    OT comments  Pt. Seen for skilled OT session.  Resistant to instructions and demonstration of safe techniques for bsc transfer. Mood fluctuations noted between calm and talkative then easily triggered with spikes of agitation when therapist asst.  persistent with instructions and guidance for safety. Min guard a for eob to bsc transfer.    Follow Up Recommendations       Equipment Recommendations  3 in 1 bedside commode    Recommendations for Other Services      Precautions / Restrictions Precautions Precautions: Fall Restrictions RUE Weight Bearing: Non weight bearing RLE Weight Bearing: Non weight bearing       Mobility Bed Mobility Overal bed mobility: Independent                Transfers Overall transfer level: Needs assistance Equipment used: None Transfers: Sit to/from Stand;Stand Pivot Transfers Sit to Stand: Min guard Stand pivot transfers: Min guard       General transfer comment: refusing to follow inst. for hand placement and safety during pivot    Balance                                           ADL either performed or assessed with clinical judgement   ADL Overall ADL's : Needs assistance/impaired                         Toilet Transfer: Min Public librarian Details (indicate cue type and reason): pt. adamant about completing bsc  transfer without any assistance.  attempted to educate to place bsc on left side so he would be tranfering to side with LLE. pt. refused and insisted on transferring to right side. attempted to educate on hand placement on bsc to reach with RUE. pt. cont. to defer me and say "no, no, no, just stand back now".  sit/stand, reached with b ue facing the bsc then twisted to bring RUE on the appropriate armrest and completed the transfer to sit down.  was fully clothed so peformed sit/stand and pulled down pants then sat down again Toileting- Clothing Manipulation and Hygiene: Sitting/lateral lean;Set up       Functional mobility during ADLs: Min guard;Cueing for safety General ADL Comments: pt. refusing bed alarm, rn present and attempted to set the alarm, we both explained she would put in on the setting that did allow him to roll around and would only go off i he got oob. he began to yell "yall dont trust me, yall dont trust me, i be telling yall over and over that if i say im not going to get oob than im not going to get oob".  rn did not set the alarm per pt. demand.      Vision       Perception     Praxis  Cognition Arousal/Alertness: Awake/alert Behavior During Therapy: Anxious;Agitated;Impulsive Overall Cognitive Status: Within Functional Limits for tasks assessed                                 General Comments: fluctuating between rational behavior, conversational and joking. but would get very angry if he was told something he did not want to hear or when instructions are provided.         Exercises     Shoulder Instructions       General Comments      Pertinent Vitals/ Pain       Pain Assessment: No/denies pain  Home Living                                          Prior Functioning/Environment              Frequency  Min 2X/week        Progress Toward Goals  OT Goals(current goals can now be found in the care plan  section)        Plan Discharge plan remains appropriate    Co-evaluation                 AM-PAC OT "6 Clicks" Daily Activity     Outcome Measure   Help from another person eating meals?: None Help from another person taking care of personal grooming?: None Help from another person toileting, which includes using toliet, bedpan, or urinal?: None Help from another person bathing (including washing, rinsing, drying)?: A Little Help from another person to put on and taking off regular upper body clothing?: None Help from another person to put on and taking off regular lower body clothing?: None 6 Click Score: 23    End of Session    OT Visit Diagnosis: Unsteadiness on feet (R26.81);Muscle weakness (generalized) (M62.81) Pain - Right/Left: Right Pain - part of body: Leg   Activity Tolerance Patient tolerated treatment well   Patient Left in bed;with call bell/phone within reach;Other (comment)(rn present attempted alarm, pt. yelling and demanding it not sent, rn did not set the alarm)   Nurse Communication          Time: 5462-7035 OT Time Calculation (min): 14 min  Charges: OT General Charges $OT Visit: 1 Visit OT Treatments $Self Care/Home Management : 8-22 mins  Janice Coffin, COTA/L 11/26/2018, 11:31 AM

## 2018-11-27 LAB — GLUCOSE, CAPILLARY
Glucose-Capillary: 183 mg/dL — ABNORMAL HIGH (ref 70–99)
Glucose-Capillary: 197 mg/dL — ABNORMAL HIGH (ref 70–99)
Glucose-Capillary: 156 mg/dL — ABNORMAL HIGH (ref 70–99)
Glucose-Capillary: 228 mg/dL — ABNORMAL HIGH (ref 70–99)

## 2018-11-27 LAB — CBC
HEMATOCRIT: 30.6 % — AB (ref 39.0–52.0)
HEMOGLOBIN: 9.7 g/dL — AB (ref 13.0–17.0)
MCH: 28.7 pg (ref 26.0–34.0)
MCHC: 31.7 g/dL (ref 30.0–36.0)
MCV: 90.5 fL (ref 80.0–100.0)
Platelets: 350 10*3/uL (ref 150–400)
RBC: 3.38 MIL/uL — ABNORMAL LOW (ref 4.22–5.81)
RDW: 14.6 % (ref 11.5–15.5)
WBC: 8 10*3/uL (ref 4.0–10.5)
nRBC: 0 % (ref 0.0–0.2)

## 2018-11-27 NOTE — Progress Notes (Signed)
PROGRESS NOTE    Ryan Medina  NIO:[id number] DOB: 18-Oct-1960 DOA: 09/03/2018 PCP: Patient, No Pcp Per   Brief Narrative:  Per admitting physician: Ryan Medina is a58 y.o.M with DM, HTN, sCHF EF 45% and polysubstance abuse/homelessness who presented initially with left leg pain on 12/13, found to have acute thromboembolic ischemic left leg. Taken emergently for embolectomy.  Post-op course complicated. Of primary importance, found to have inoperable pancreatic cancer. In brief, right leg progressively worse, ultimately gangrenous requiring BKA; difficult balance between -- recurrent thromboembolic events due to malignancy vs. severe and recurrent GIB from duodenal ulcer (erosion from malignancy), all punctuated by pneumonia sepsis, now resolved.    Assessment & Plan:   Principal Problem:   Popliteal artery embolus (HCC) Active Problems:   DM (diabetes mellitus), type 2, uncontrolled ([place])   Essential hypertension   Dilated cardiomyopathy (HCC)   Chest pain   AAA (abdominal aortic aneurysm) (HCC)   Ischemic foot   Right leg claudication (HCC)   PVD (peripheral vascular disease) ([place])   Hemorrhagic shock ([place])   Nontraumatic ischemic infarction of muscle of right lower leg   Upper GI bleed   Cocaine abuse ([place])   Acute blood loss anemia   Chronic pain syndrome   Duodenal ulcer with hemorrhage   Pancreatic cancer ([place])   Goals of care, counseling/discussion   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   Pancreatic adenocarcinoma ([place])   Palliative care by specialist   Protein-calorie malnutrition, severe   Abdominal distention   Cancer associated pain   HCAP (healthcare-associated pneumonia)   Acute lower UTI   Sepsis ([place])   Homelessness   Weakness generalized   Gastric outflow obstruction   Hematochezia   BRBPR (bright red blood per rectum)   Acute gastrojejunal anastomotic ulcer   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   Gastric outlet obstruction Severe protein calorie malnutrition S/p gastrojejunostomy No change -Consulteddietitian, appreciate their recs -Continue Ensure -Tolerating diet without complication  Acute blood loss anemia on anemia of chronic disease Hematochezia Malignant duodenal ulcer Anemia of iron deficiency Hgb stable at 9.7Likely this is iron deficiency superimposed on marrow loss (amputation) and previous blood loss. No evid of new bleeding -Feraheme3/3,c/woral iron -ContinuePPI and sucralfate  Pacreatic adenoCA -Nonoperative, palliative care involved 2/2 advanced nature for sx control  Candida esophagitis - appears resolved, no sx  Pulmonary embolism GI bleeding appears stopped. Patient refused Lovenox. Discussed with Dr. Marin Olp and patient, Eliquis is a suboptimal alternative, but patient will accept. -c/wEliquis  Chronic systolic and diastolic CHF Stable,no evidence of volume overload  Diabetes Hyperglycemic -Continue withLevemir 4 QHS -Continue SSI  Sepsis from HCAP, Pseudomonas UTI Treated with Cefepime 1/11-1/16, fever resolved.  MDM and disposition: The below labs and imaging reports were reviewed and summarized above. Medication management as above. Including anticoagulation.  The patient was admitted with an ischemic leg, found to have pancreatic cancer and hypercoagulability causing multiple complications, including a prolonged gastrointestinal hemorrhage.   His GI bleeding is essentially resolved now, and he has been stable. He finally had to undergo right leg BKA due to gangrene, and he is progressing well from thisalthough vascular surgery is following closely secondary to some bleeding at wound site  The patient at this  point in time will need rehabilitation from his right BKA  as well as malnutrition, as well as frequent lab monitoring.PT/OT evaluations performed. SNF recommended. SNF appropriate as the patient has received 3 days of hospital care (or the 3 day period has been waived by the patient's insurance company) and is felt to need rehab services to restore this patient to their prior level of function to achieve safe transition back to home care. This patient needs rehab services for at least 5 days per week and skilled nursing services daily to facilitate this transition. Rehab is being requested as the most appropriate d/c option for this patient and is NOT felt to be for custodial care as evidenced bypatient's ability to ambulate as well as perform ADLs prior to admission  DVT prophylaxis: NI:[id number]  Code Status: Partial    Code Status Orders  (From admission, onward)         Start     Ordered   09/07/18 1850  Limited resuscitation (code)  Continuous    Question Answer Comment  In the event of cardiac or respiratory ARREST: Initiate Code Blue, Call Rapid Response No   In the event of cardiac or respiratory ARREST: Perform CPR No   In the event of cardiac or respiratory ARREST: Perform Intubation/Mechanical Ventilation Yes   In the event of cardiac or respiratory ARREST: Use NIPPV/BiPAp only if indicated No   In the event of cardiac or respiratory ARREST: Administer ACLS medications if indicated No   In the event of cardiac or respiratory ARREST: Perform Defibrillation or Cardioversion if indicated No   Comments Continue current care with mechanical ventilation and vasopressor support.      09/07/18 1850        Code Status History    Date Active Date Inactive Code Status Order ID Comments User Context   09/03/2018 2251 09/07/2018 1850 Full Code [id number]  [name], MD Inpatient   09/03/2018 2236 09/03/2018 2251 Full Code [id number]  Iline Oven Inpatient   08/22/2018  2203 08/25/2018 1655 Full Code [id number]  Ivor Costa, MD ED   01/30/2018 0308 01/31/2018 1807 Full Code [id number]  [name], MD ED   05/19/2016 1029 05/19/2016 1721 Full Code [id number]  Leonie Man, MD Inpatient     Family Communication: None present Disposition Plan:   Pending acceptance at skilled nursing facility for placement Consults called: None today Admission status: Inpatient   Consultants:   Cardiology, general surgery, wound care, palliative care.  Procedures:  Dg Abd 1 View  Result Date: 11/07/2018 CLINICAL DATA:  Abdominal pain. EXAM: ABDOMEN - 1 VIEW COMPARISON:  October 29, 2018 FINDINGS: Moderate fecal loading in the colon. No evidence of bowel obstruction. There is a paucity of small bowel gas but no evidence of obstruction. No other acute abnormalities. IMPRESSION: Moderate fecal loading in the colon. No other acute abnormalities noted. Electronically Signed   By: Dorise Bullion III M.D   On: 11/07/2018 18:37   Ct Abdomen Pelvis W Contrast  Result Date: 11/25/2018 CLINICAL DATA:  Inpatient. Nonoperable malignant-appearing pancreatic mass diagnosed December 2019. Restaging. EXAM: CT ABDOMEN AND PELVIS WITH CONTRAST TECHNIQUE: Multidetector CT imaging of the abdomen and pelvis was performed using the standard protocol following bolus administration of intravenous contrast. CONTRAST:  166mL OMNIPAQUE IOHEXOL 300 MG/ML  SOLN COMPARISON:  11/10/2018 CT angiogram of the abdomen and pelvis. FINDINGS: Lower chest: No significant pulmonary nodules or acute consolidative airspace disease. Stable mildly thickened parenchymal band at the left lung base compatible with postinfectious/postinflammatory scarring. Tip of superior approach Port-A-Cath  is seen at the cavoatrial junction. Hepatobiliary: Normal liver size. Simple 1.0 cm lateral segment left liver lobe cyst is stable. No new liver lesions. Normal gallbladder with no radiopaque cholelithiasis. No biliary ductal  dilatation. Pancreas: Poorly marginated heterogeneous hypoenhancing 5.5 x 4.0 cm pancreatic body mass (series 3/image 24), previously 5.3 x 3.9 cm on 11/10/2018 using similar measurement technique, stable to minimally increased. Stable atrophy and duct dilation in the pancreatic tail. Stable encasement of the celiac trunk by the mass. At least partial anterior encasement of the proximal SMA by the mass. Spleen: Normal size. No mass. Adrenals/Urinary Tract: Normal adrenals. Nonobstructing 3 mm lower right renal stone. Stable scattered subcentimeter hypodense renal cortical lesions in both kidneys. No new renal lesions. No hydronephrosis. Normal bladder. Stomach/Bowel: Stomach is nondistended. Stable gastric fold thickening in the fundus and proximal body of the stomach. Normal caliber small bowel with no small bowel wall thickening. Appendectomy. Oral contrast transits to the left colon. Mild-to-moderate colonic stool with no large bowel wall thickening, colonic diverticulosis or significant pericolonic fat stranding. Vascular/Lymphatic: Atherosclerotic abdominal aorta with stable 3.3 cm infrarenal abdominal aortic aneurysm. 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 Stable 2.9 cm left common iliac artery aneurysm. Patent hepatic, portal and renal veins. Stable occlusion of splenic vein at the level of the pancreatic tail. Redemonstration of embolization coils in the gastroduodenal artery. Stable nonocclusive thrombosis of the right common femoral vein. Mildly enlarged 1.3 cm right external iliac node (series 3/image 71), increased from 1.0 cm. No additional pathologically enlarged lymph nodes in the abdomen or pelvis. Reproductive: Normal size prostate. Other: No pneumoperitoneum, ascites or focal fluid collection. Progressive cachexia, particularly compared to 12/19/2017 CT study. Musculoskeletal: No aggressive appearing focal osseous lesions. Moderate lower lumbar spondylosis. IMPRESSION: 1. Locally  advanced 5.5 cm pancreatic body mass is stable to slightly increased since 11/10/2018 CT. 2. Mild right external iliac lymphadenopathy is mildly increased, nonspecific, could be reactive or metastatic. 3. No additional potential sites of metastatic disease. 4. Stable 3.3 cm Abdominal Aortic Aneurysm (ICD10-I71.9). Stable bilateral common iliac artery aneurysms. 5. Stable nonocclusive DVT in the right common femoral vein. 6. Progressive cachexia. Electronically Signed   By: Ilona Sorrel M.D.   On: 11/25/2018 16:52   Dg Abd Portable 1v  Result Date: 10/29/2018 CLINICAL DATA:  Follow-up gastric catheter placement EXAM: PORTABLE ABDOMEN - 1 VIEW COMPARISON:  None. FINDINGS: Gastric catheter is noted within the stomach. Changes of prior embolus therapy are noted in the right upper quadrant. Contrast material is noted scattered throughout the colon related to the recent CT examination. Mild retained fecal material is noted. IMPRESSION: Gastric catheter within the stomach. Electronically Signed   By: Inez Catalina M.D.   On: 10/29/2018 16:34   [place] Ekg Site Rite  Result Date: 11/05/2018 If Site Rite image not attached, placement could not be confirmed due to current cardiac rhythm.  Ct Angio Abd/pel W/ And/or W/o  Result Date: 11/10/2018 CLINICAL DATA:  Pancreatic carcinoma with partial duodenal obstruction, gastric varices. GI bleed since gastrojejunostomy on 11/04/18^ EXAM: CTA ABDOMEN AND PELVIS wITHOUT AND WITH CONTRAST TECHNIQUE: Multidetector CT imaging of the abdomen and pelvis was performed using the standard protocol during bolus administration of intravenous contrast. Multiplanar reconstructed images and MIPs were obtained and reviewed to evaluate the vascular anatomy. CONTRAST:  138mL ISOVUE-370 IOPAMIDOL (ISOVUE-370) INJECTION 76% COMPARISON:  10/27/2018 FINDINGS: VASCULAR Aorta: Fusiform abdominal aortic aneurysm measured up to 3.1 cm maximum diameter, stable. No significant intraluminal thrombus. No  dissection  or stenosis. Celiac: Patent. The left gastric artery arises directly from the abdominal aorta, an anatomic variant. Metallic gastroduodenal embolization coils without evidence of recanalization. SMA: Patent without evidence of aneurysm, dissection, vasculitis or significant stenosis. Renals: Both renal arteries are patent without evidence of aneurysm, dissection, vasculitis, fibromuscular dysplasia or significant stenosis. IMA: Short-segment origin stenosis. Distally patent without evidence of aneurysm, dissection, vasculitis or significant stenosis. Inflow: Fusiform dilatation of the right common iliac artery up to 2.9 cm diameter. Fusiform dilatation of left common iliac artery up to 2.7 cm diameter with some eccentric nonocclusive mural thrombus. Tortuous ectatic distal iliac arterial systems without significant stenosis. Proximal Outflow: Bilateral common femoral and visualized portions of the superficial and profunda femoral arteries are patent without evidence of aneurysm, dissection, vasculitis or significant stenosis. Veins: Apparent splenic venous occlusion with collaterals from the splenic hilum extending along the anterior mesentery. Streak artifact from GDA embolization coils obscures visualization of the portosplenic confluence. Main portal vein and intrahepatic branches are patent. Hepatic veins patent. Review of the MIP images confirms the above findings. NON-VASCULAR Lower chest: Chronic atelectasis/scarring in the left lower lobe. No pleural or pericardial effusion. Hepatobiliary: No focal liver abnormality is seen. No gallstones, gallbladder wall thickening, or biliary dilatation. Pancreas: Decreased enhancement in the mid pancreatic body. Atrophy in the pancreatic tail. Some image degradation through the pancreatic head secondary to regional embolization coils. Spleen: Normal in size without focal lesion. Adrenals/Urinary Tract: Adrenal glands are unremarkable. Kidneys are normal,  without renal calculi, focal lesion, or hydronephrosis. Bladder is unremarkable. Stomach/Bowel: Stomach is nondilated. Gastrojejunostomy staple line in the lateral gastric body. No evidence of active extravasation. Small bowel is decompressed. Moderate proximal colonic and rectal fecal material without dilatation. Lymphatic: No definite abdominal or pelvic adenopathy, evaluation limited secondary to paucity of fat and diffuse anasarca. Reproductive: Prostate is unremarkable. Other: No significant ascites. Scattered bubbles of free intraperitoneal air presumably postoperative. Musculoskeletal: Midline anterior abdominal wall skin staples. Lumbosacral spondylitic change. No fracture or worrisome bone lesion. IMPRESSION: VASCULAR 1. No evidence of active extravasation. 2. 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 3. 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 NON-VASCULAR 1. No acute findings. 2. Decreased enhancement in the mid pancreatic body suggesting residual/recurrent neoplasm. Electronically Signed   By: Lucrezia Europe M.D.   On: 11/10/2018 16:46     Antimicrobials:   None   Subjective: Patient did well overnight.  Resting in bed comfortably.  Reports no abdominal pain, positive bowel movements,  Objective: Vitals:   11/26/18 0340 11/26/18 1347 11/26/18 1951 11/27/18 0536  BP: 115/77 116/88 (!) 136/100 (!) 118/97  Pulse: (!) 101 77 99 90  Resp: 17 18    Temp: 99.8 F (37.7 C) 98.5 F (36.9 C) 97.9 F (36.6 C) 97.8 F (36.6 C)  TempSrc: Oral Oral Oral Oral  SpO2: 100% 100% 100% 100%  Weight:      Height:        Intake/Output Summary (Last 24 hours) at 11/27/2018 1056 Last data filed at 11/27/2018 0700 Gross per 24 hour  Intake -  Output 375 ml  Net -375 ml   Filed Weights   11/02/18 1806 11/04/18 0831 11/12/18 0742  Weight: 56.2 kg 56.2 kg 56.2 kg    Examination:  General  exam: Appears calm and comfortable chronically ill-appearing Respiratory system: Clear to auscultation. Respiratory effort normal. Cardiovascular system: S1 & S2 heard, RRR. No JVD, murmurs,  rubs, gallops or clicks. No pedal edema. Gastrointestinal system: Abdomen is nondistended, soft and nontender. No organomegaly or masses felt. Normal bowel sounds heard. Central nervous system: Alert and oriented. No focal neurological deficits. Extremities: Symmetric 5 x 5 power.  Status post BKA no bleedthrough on bandage Skin: No rashes, lesions or ulcers Psychiatry: Judgement and insight appear normal. Mood & affect appropriate.     Data Reviewed: I have personally reviewed following labs and imaging studies  CBC: Recent Labs  Lab 11/23/18 1042 11/24/18 0347 11/25/18 0521 11/26/18 0249 11/27/18 0423  WBC 12.4* 9.7 8.2 10.2 8.0  NEUTROABS 10.5*  --   --   --   --   HGB 7.7* 7.8* 7.4* 9.7* 9.7*  HCT 24.4* 24.9* 23.2* 30.4* 30.6*  MCV 90.7 91.2 91.3 89.7 90.5  PLT 246 227 256 335 737   Basic Metabolic Panel: Recent Labs  Lab 11/21/18 0428 11/22/18 0352 11/23/18 0447 11/26/18 0249  NA 133* 132* 134*  --   K 3.7 3.4* 3.4*  --   CL 96* 96* 99  --   CO2 28 24 30   --   GLUCOSE 161* 142* 209*  --   BUN 8 11 14   --   CREATININE 0.54* 0.47* 0.40* 0.48*  CALCIUM 8.3* 8.0* 8.4*  --    GFR: Estimated Creatinine Clearance: 81 mL/min (A) (by C-G formula based on SCr of 0.48 mg/dL (L)). Liver Function Tests: Recent Labs  Lab 11/23/18 0447  AST 28  ALT 29  ALKPHOS 84  BILITOT 0.5  PROT 6.5  ALBUMIN 1.8*   No results for input(s): LIPASE, AMYLASE in the last 168 hours. No results for input(s): AMMONIA in the last 168 hours. Coagulation Profile: No results for input(s): INR, PROTIME in the last 168 hours. Cardiac Enzymes: No results for input(s): CKTOTAL, CKMB, CKMBINDEX, TROPONINI in the last 168 hours. BNP (last 3 results) No results for input(s): PROBNP in the last 8760  hours. HbA1C: No results for input(s): HGBA1C in the last 72 hours. CBG: Recent Labs  Lab 11/26/18 1148 11/26/18 1707 11/26/18 2116 11/26/18 2244 11/27/18 0758  GLUCAP 339* 174* 163* 163* 183*   Lipid Profile: No results for input(s): CHOL, HDL, LDLCALC, TRIG, CHOLHDL, LDLDIRECT in the last 72 hours. Thyroid Function Tests: No results for input(s): TSH, T4TOTAL, FREET4, T3FREE, THYROIDAB in the last 72 hours. Anemia Panel: No results for input(s): VITAMINB12, FOLATE, FERRITIN, TIBC, IRON, RETICCTPCT in the last 72 hours. Sepsis Labs: No results for input(s): PROCALCITON, LATICACIDVEN in the last 168 hours.  Recent Results (from the past 240 hour(s))  Surgical pcr screen     Status: None   Collection Time: 11/18/18  2:58 PM  Result Value Ref Range Status   MRSA, PCR NEGATIVE NEGATIVE Final   Staphylococcus aureus NEGATIVE NEGATIVE Final    Comment: (NOTE) The Xpert SA Assay (FDA approved for NASAL specimens in patients 62 years of age and older), is one component of a comprehensive surveillance program. It is not intended to diagnose infection nor to guide or monitor treatment. Performed at [facility], [place] [address] [place], [place] [address]          Radiology Studies: Ct Abdomen Pelvis W Contrast  Result Date: 11/25/2018 CLINICAL DATA:  Inpatient. Nonoperable malignant-appearing pancreatic mass diagnosed December 2019. Restaging. EXAM: CT ABDOMEN AND PELVIS WITH CONTRAST TECHNIQUE: Multidetector CT imaging of the abdomen and pelvis was performed using the standard protocol following bolus administration of intravenous contrast. CONTRAST:  131mL  OMNIPAQUE IOHEXOL 300 MG/ML  SOLN COMPARISON:  11/10/2018 CT angiogram of the abdomen and pelvis. FINDINGS: Lower chest: No significant pulmonary nodules or acute consolidative airspace disease. Stable mildly thickened parenchymal band at the left lung base compatible with postinfectious/postinflammatory scarring. Tip of  superior approach Port-A-Cath is seen at the cavoatrial junction. Hepatobiliary: Normal liver size. Simple 1.0 cm lateral segment left liver lobe cyst is stable. No new liver lesions. Normal gallbladder with no radiopaque cholelithiasis. No biliary ductal dilatation. Pancreas: Poorly marginated heterogeneous hypoenhancing 5.5 x 4.0 cm pancreatic body mass (series 3/image 24), previously 5.3 x 3.9 cm on 11/10/2018 using similar measurement technique, stable to minimally increased. Stable atrophy and duct dilation in the pancreatic tail. Stable encasement of the celiac trunk by the mass. At least partial anterior encasement of the proximal SMA by the mass. Spleen: Normal size. No mass. Adrenals/Urinary Tract: Normal adrenals. Nonobstructing 3 mm lower right renal stone. Stable scattered subcentimeter hypodense renal cortical lesions in both kidneys. No new renal lesions. No hydronephrosis. Normal bladder. Stomach/Bowel: Stomach is nondistended. Stable gastric fold thickening in the fundus and proximal body of the stomach. Normal caliber small bowel with no small bowel wall thickening. Appendectomy. Oral contrast transits to the left colon. Mild-to-moderate colonic stool with no large bowel wall thickening, colonic diverticulosis or significant pericolonic fat stranding. Vascular/Lymphatic: Atherosclerotic abdominal aorta with stable 3.3 cm infrarenal abdominal aortic aneurysm. 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 Stable 2.9 cm left common iliac artery aneurysm. Patent hepatic, portal and renal veins. Stable occlusion of splenic vein at the level of the pancreatic tail. Redemonstration of embolization coils in the gastroduodenal artery. Stable nonocclusive thrombosis of the right common femoral vein. Mildly enlarged 1.3 cm right external iliac node (series 3/image 71), increased from 1.0 cm. No additional pathologically enlarged lymph nodes in the abdomen or pelvis. Reproductive: Normal size prostate.  Other: No pneumoperitoneum, ascites or focal fluid collection. Progressive cachexia, particularly compared to 12/19/2017 CT study. Musculoskeletal: No aggressive appearing focal osseous lesions. Moderate lower lumbar spondylosis. IMPRESSION: 1. Locally advanced 5.5 cm pancreatic body mass is stable to slightly increased since 11/10/2018 CT. 2. Mild right external iliac lymphadenopathy is mildly increased, nonspecific, could be reactive or metastatic. 3. No additional potential sites of metastatic disease. 4. Stable 3.3 cm Abdominal Aortic Aneurysm (ICD10-I71.9). Stable bilateral common iliac artery aneurysms. 5. Stable nonocclusive DVT in the right common femoral vein. 6. Progressive cachexia. Electronically Signed   By: Ilona Sorrel M.D.   On: 11/25/2018 16:52        Scheduled Meds: . acetaminophen  1,000 mg Oral Q8H  . apixaban  5 mg Oral BID  . docusate sodium  100 mg Oral Daily  . feeding supplement (ENSURE ENLIVE)  237 mL Oral TID BM  . insulin aspart  0-5 Units Subcutaneous QHS  . insulin aspart  0-9 Units Subcutaneous TID WC  . pantoprazole  40 mg Oral BID  . polyethylene glycol  17 g Oral Daily  . sodium chloride flush  10-40 mL Intracatheter Q12H  . sucralfate  1 g Oral TID WC & HS   Continuous Infusions: . magnesium sulfate 1 - 4 g bolus IVPB       LOS: 85 days    Time spent: 25 min    Nicolette Bang, MD [facility]  If 7PM-7AM, please contact night-coverage  11/27/2018, 10:56 AM

## 2018-11-27 NOTE — Progress Notes (Signed)
The dressing removed- small amount of bloody drainage.  Sterile dressing applied with gauze and ace wrap.  Incision appears to be healing well.

## 2018-11-28 DIAGNOSIS — R52 Pain, unspecified: Secondary | ICD-10-CM

## 2018-11-28 LAB — CBC
HCT: 32.2 % — ABNORMAL LOW (ref 39.0–52.0)
Hemoglobin: 10.2 g/dL — ABNORMAL LOW (ref 13.0–17.0)
MCH: 28.6 pg (ref 26.0–34.0)
MCHC: 31.7 g/dL (ref 30.0–36.0)
MCV: 90.2 fL (ref 80.0–100.0)
Platelets: 385 10*3/uL (ref 150–400)
RBC: 3.57 MIL/uL — ABNORMAL LOW (ref 4.22–5.81)
RDW: 14.3 % (ref 11.5–15.5)
WBC: 8.8 10*3/uL (ref 4.0–10.5)
nRBC: 0 % (ref 0.0–0.2)

## 2018-11-28 LAB — GLUCOSE, CAPILLARY
GLUCOSE-CAPILLARY: 119 mg/dL — AB (ref 70–99)
Glucose-Capillary: 128 mg/dL — ABNORMAL HIGH (ref 70–99)
Glucose-Capillary: 124 mg/dL — ABNORMAL HIGH (ref 70–99)
Glucose-Capillary: 267 mg/dL — ABNORMAL HIGH (ref 70–99)

## 2018-11-28 NOTE — Progress Notes (Signed)
Dressing changed done.  Incision intact with staples in place.  Draining small amount of bloody drainage from the middle of incision where it appears to be missing 2 staples.  The patient reports that the Doctor removed 2 staples for drainage. The incision was cleansed with Normal saline and dressing reapplied with ace wrap in place.  Right leg is elevated on a pillow for comfort.

## 2018-11-28 NOTE — Progress Notes (Signed)
Patient ID: Ryan Medina, male   DOB: [birth year], 58 y.o.   MRN: [id number]  This NP visited patient at the bedside as a follow up for palliative medicine needs and emotional support.  Patient has had a long complicated hospitalization; inoperable pancreatic adenocarcinoma,  HTN, DM, COPD, ischemic right leg/ s/p BKA, GIB, PE/ DVT, s/p gastric outlet surgery, malnutrition,  substance abuse all compounded by dense psychosocial issues ((homeless  and minimal social support)  Created space and opportunity for Mr. Litzinger to continue to explore his thoughts and feelings about his current medical situation.  "I'm just waiting" , hopeful for transition to rehab and OP oncology treatments      Recommend community based palliative on discharge   He remains open to all offered and available medical interventions and his hope is continued quality of life.  Emotional support offered.       Questions and concerns addressed      Palliative medicine team will continue to support holistically.  I let Mr Weider know I would not see him again until next week, he can call team phone with questions or concerns  Total time spent on the unit was 20 minutes  Greater than 50% of the time was spent in counseling and coordination of care  Wadie Lessen NP  Palliative Medicine Team Team Phone # [phone] Pager [phone]

## 2018-11-28 NOTE — Progress Notes (Signed)
[facility]  Subjective  - No complaints.  Eating breakfast.  States already had his dressing change by nursing earlier this morning.   Objective (!) 119/97 95 97.6 F (36.4 C) (Oral) 18 100%  Intake/Output Summary (Last 24 hours) at 11/28/2018 0923 Last data filed at 11/28/2018 0732 Gross per 24 hour  Intake -  Output 152 ml  Net -152 ml    R BKA dressing c/d/i  Laboratory Lab Results: Recent Labs    11/27/18 0423 11/28/18 0418  WBC 8.0 8.8  HGB 9.7* 10.2*  HCT 30.6* 32.2*  PLT 350 385   BMET Recent Labs    11/26/18 0249  CREATININE 0.48*    COAG Lab Results  Component Value Date   INR 1.31 11/02/2018   INR 1.15 10/11/2018   INR 1.23 10/02/2018   No results found for: PTT  Assessment/Planning: S/P R BKA.  Dr. Scot Dock removed several staples Friday and ordered twice daily dressing changes.  Dressing already changed by nursing this am with no apparent concerns and will not take down dressing again.  Call with questions or concerns.   Marty Heck 11/28/2018 9:23 AM --

## 2018-11-28 NOTE — Progress Notes (Signed)
PROGRESS NOTE    Langdon Crosson Jagiello  HUT:[id number] DOB: 1960-10-14 DOA: 09/03/2018 PCP: Patient, No Pcp Per   Brief Narrative:  Per admitting physician: Mr. Terpening is a58 y.o.M with DM, HTN, sCHF EF 45% and polysubstance abuse/homelessness who presented initially with left leg pain on 12/13, found to have acute thromboembolic ischemic left leg. Taken emergently for embolectomy.  Post-op course complicated. Of primary importance, found to have inoperable pancreatic cancer. In brief, right leg progressively worse, ultimately gangrenous requiring BKA; difficult balance between -- recurrent thromboembolic events due to malignancy vs. severe and recurrent GIB from duodenal ulcer (erosion from malignancy), all punctuated by pneumonia sepsis, now resolved.    Assessment & Plan:   Principal Problem:   Popliteal artery embolus (HCC) Active Problems:   DM (diabetes mellitus), type 2, uncontrolled ([place])   Essential hypertension   Dilated cardiomyopathy (HCC)   Chest pain   AAA (abdominal aortic aneurysm) (HCC)   Ischemic foot   Right leg claudication (HCC)   PVD (peripheral vascular disease) ([place])   Hemorrhagic shock ([place])   Nontraumatic ischemic infarction of muscle of right lower leg   Upper GI bleed   Cocaine abuse ([place])   Acute blood loss anemia   Chronic pain syndrome   Duodenal ulcer with hemorrhage   Pancreatic cancer ([place])   Goals of care, counseling/discussion   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   Pancreatic adenocarcinoma ([place])   Palliative care by specialist   Protein-calorie malnutrition, severe   Abdominal distention   Cancer associated pain   HCAP (healthcare-associated pneumonia)   Acute lower UTI   Sepsis ([place])   Homelessness   Weakness generalized   Gastric outflow obstruction   Hematochezia   BRBPR (bright red blood per rectum)   Acute gastrojejunal anastomotic ulcer   Gangrene of right foot ([place])   Right leg gangrene S/p  embolectomy x2 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   Gastric outlet obstruction Severe protein calorie malnutrition S/p gastrojejunostomy No change -Consulteddietitian, appreciate their recs -Continue Ensure -Tolerating diet without complication  Acute blood loss anemia on anemia of chronic disease Hematochezia Malignant duodenal ulcer Anemia of iron deficiency Hgb stable at 9.7Likely this is iron deficiency superimposed on marrow loss (amputation) and previous blood loss. No evid of new bleeding -Feraheme3/3,c/woral iron -ContinuePPI and sucralfate  Pacreatic adenoCA -Nonoperative, palliative care involved 2/2 advanced nature for sx control  Candida esophagitis - appears resolved, no sx  Pulmonary embolism GI bleeding appears stopped. Patient refused Lovenox. Discussed with Dr. Marin Olp and patient, Eliquis is a suboptimal alternative, but patient will accept. -c/wEliquis  Chronic systolic and diastolic CHF Stable,no evidence of volume overload  Diabetes Hyperglycemic -Continue withLevemir 4 QHS -Continue SSI  Sepsis from HCAP, Pseudomonas UTI Treated with Cefepime 1/11-1/16, fever resolved.  MDM and disposition: The below labs and imaging reports were reviewed and summarized above. Medication management as above. Including anticoagulation.  The patient was admitted with an ischemic leg, found to have pancreatic cancer and hypercoagulability causing multiple complications, including a prolonged gastrointestinal hemorrhage.   His GI bleeding is essentially resolved now, and he has been stable. He finally had to undergo right leg BKA due to gangrene, and he is progressing well from thisalthough vascular surgery is following closely secondary to some bleeding at wound site  The patient at this  point in time will need rehabilitation from his right BKA  as well as malnutrition, as well as frequent lab monitoring.PT/OT evaluations performed. SNF recommended. SNF appropriate as the patient has received 3 days of hospital care (or the 3 day period has been waived by the patient's insurance company) and is felt to need rehab services to restore this patient to their prior level of function to achieve safe transition back to home care. This patient needs rehab services for at least 5 days per week and skilled nursing services daily to facilitate this transition. Rehab is being requested as the most appropriate d/c option for this patient and is NOT felt to be for custodial care as evidenced bypatient's ability to ambulate as well as perform ADLs prior to admission   DVT prophylaxis: IW:[id number]  Code Status: partial    Code Status Orders  (From admission, onward)         Start     Ordered   09/07/18 1850  Limited resuscitation (code)  Continuous    Question Answer Comment  In the event of cardiac or respiratory ARREST: Initiate Code Blue, Call Rapid Response No   In the event of cardiac or respiratory ARREST: Perform CPR No   In the event of cardiac or respiratory ARREST: Perform Intubation/Mechanical Ventilation Yes   In the event of cardiac or respiratory ARREST: Use NIPPV/BiPAp only if indicated No   In the event of cardiac or respiratory ARREST: Administer ACLS medications if indicated No   In the event of cardiac or respiratory ARREST: Perform Defibrillation or Cardioversion if indicated No   Comments Continue current care with mechanical ventilation and vasopressor support.      09/07/18 1850        Code Status History    Date Active Date Inactive Code Status Order ID Comments User Context   09/03/2018 2251 09/07/2018 1850 Full Code [id number]  [name], MD Inpatient   09/03/2018 2236 09/03/2018 2251 Full Code [id number]  Iline Oven Inpatient    08/22/2018 2203 08/25/2018 1655 Full Code [id number]  Ivor Costa, MD ED   01/30/2018 0308 01/31/2018 1807 Full Code [id number]  [name], MD ED   05/19/2016 1029 05/19/2016 1721 Full Code [id number]  Leonie Man, MD Inpatient     Family Communication: none present  Disposition Plan:   snf pending Consults called: none today Admission status: Inpatient   Consultants:   .Cardiology, general surgery, wound care, palliative care, vascular surgery  Procedures:  Dg Abd 1 View  Result Date: 11/07/2018 CLINICAL DATA:  Abdominal pain. EXAM: ABDOMEN - 1 VIEW COMPARISON:  October 29, 2018 FINDINGS: Moderate fecal loading in the colon. No evidence of bowel obstruction. There is a paucity of small bowel gas but no evidence of obstruction. No other acute abnormalities. IMPRESSION: Moderate fecal loading in the colon. No other acute abnormalities noted. Electronically Signed   By: Dorise Bullion III M.D   On: 11/07/2018 18:37   Ct Abdomen Pelvis W Contrast  Result Date: 11/25/2018 CLINICAL DATA:  Inpatient. Nonoperable malignant-appearing pancreatic mass diagnosed December 2019. Restaging. EXAM: CT ABDOMEN AND PELVIS WITH CONTRAST TECHNIQUE: Multidetector CT imaging of the abdomen and pelvis was performed using the standard protocol following bolus administration of intravenous contrast. CONTRAST:  179mL OMNIPAQUE IOHEXOL 300 MG/ML  SOLN COMPARISON:  11/10/2018 CT angiogram of the abdomen and pelvis. FINDINGS: Lower chest: No significant pulmonary nodules or acute consolidative airspace disease. Stable mildly thickened parenchymal band at the left lung base compatible with postinfectious/postinflammatory scarring. Tip of superior approach Port-A-Cath is seen  at the cavoatrial junction. Hepatobiliary: Normal liver size. Simple 1.0 cm lateral segment left liver lobe cyst is stable. No new liver lesions. Normal gallbladder with no radiopaque cholelithiasis. No biliary ductal dilatation. Pancreas: Poorly  marginated heterogeneous hypoenhancing 5.5 x 4.0 cm pancreatic body mass (series 3/image 24), previously 5.3 x 3.9 cm on 11/10/2018 using similar measurement technique, stable to minimally increased. Stable atrophy and duct dilation in the pancreatic tail. Stable encasement of the celiac trunk by the mass. At least partial anterior encasement of the proximal SMA by the mass. Spleen: Normal size. No mass. Adrenals/Urinary Tract: Normal adrenals. Nonobstructing 3 mm lower right renal stone. Stable scattered subcentimeter hypodense renal cortical lesions in both kidneys. No new renal lesions. No hydronephrosis. Normal bladder. Stomach/Bowel: Stomach is nondistended. Stable gastric fold thickening in the fundus and proximal body of the stomach. Normal caliber small bowel with no small bowel wall thickening. Appendectomy. Oral contrast transits to the left colon. Mild-to-moderate colonic stool with no large bowel wall thickening, colonic diverticulosis or significant pericolonic fat stranding. Vascular/Lymphatic: Atherosclerotic abdominal aorta with stable 3.3 cm infrarenal abdominal aortic aneurysm. 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 Stable 2.9 cm left common iliac artery aneurysm. Patent hepatic, portal and renal veins. Stable occlusion of splenic vein at the level of the pancreatic tail. Redemonstration of embolization coils in the gastroduodenal artery. Stable nonocclusive thrombosis of the right common femoral vein. Mildly enlarged 1.3 cm right external iliac node (series 3/image 71), increased from 1.0 cm. No additional pathologically enlarged lymph nodes in the abdomen or pelvis. Reproductive: Normal size prostate. Other: No pneumoperitoneum, ascites or focal fluid collection. Progressive cachexia, particularly compared to 12/19/2017 CT study. Musculoskeletal: No aggressive appearing focal osseous lesions. Moderate lower lumbar spondylosis. IMPRESSION: 1. Locally advanced 5.5 cm pancreatic body mass  is stable to slightly increased since 11/10/2018 CT. 2. Mild right external iliac lymphadenopathy is mildly increased, nonspecific, could be reactive or metastatic. 3. No additional potential sites of metastatic disease. 4. Stable 3.3 cm Abdominal Aortic Aneurysm (ICD10-I71.9). Stable bilateral common iliac artery aneurysms. 5. Stable nonocclusive DVT in the right common femoral vein. 6. Progressive cachexia. Electronically Signed   By: Ilona Sorrel M.D.   On: 11/25/2018 16:52   Dg Abd Portable 1v  Result Date: 10/29/2018 CLINICAL DATA:  Follow-up gastric catheter placement EXAM: PORTABLE ABDOMEN - 1 VIEW COMPARISON:  None. FINDINGS: Gastric catheter is noted within the stomach. Changes of prior embolus therapy are noted in the right upper quadrant. Contrast material is noted scattered throughout the colon related to the recent CT examination. Mild retained fecal material is noted. IMPRESSION: Gastric catheter within the stomach. Electronically Signed   By: Inez Catalina M.D.   On: 10/29/2018 16:34   [place] Ekg Site Rite  Result Date: 11/05/2018 If Site Rite image not attached, placement could not be confirmed due to current cardiac rhythm.  Ct Angio Abd/pel W/ And/or W/o  Result Date: 11/10/2018 CLINICAL DATA:  Pancreatic carcinoma with partial duodenal obstruction, gastric varices. GI bleed since gastrojejunostomy on 11/04/18^ EXAM: CTA ABDOMEN AND PELVIS wITHOUT AND WITH CONTRAST TECHNIQUE: Multidetector CT imaging of the abdomen and pelvis was performed using the standard protocol during bolus administration of intravenous contrast. Multiplanar reconstructed images and MIPs were obtained and reviewed to evaluate the vascular anatomy. CONTRAST:  133mL ISOVUE-370 IOPAMIDOL (ISOVUE-370) INJECTION 76% COMPARISON:  10/27/2018 FINDINGS: VASCULAR Aorta: Fusiform abdominal aortic aneurysm measured up to 3.1 cm maximum diameter, stable. No significant intraluminal thrombus. No dissection or stenosis. Celiac:  Patent.  The left gastric artery arises directly from the abdominal aorta, an anatomic variant. Metallic gastroduodenal embolization coils without evidence of recanalization. SMA: Patent without evidence of aneurysm, dissection, vasculitis or significant stenosis. Renals: Both renal arteries are patent without evidence of aneurysm, dissection, vasculitis, fibromuscular dysplasia or significant stenosis. IMA: Short-segment origin stenosis. Distally patent without evidence of aneurysm, dissection, vasculitis or significant stenosis. Inflow: Fusiform dilatation of the right common iliac artery up to 2.9 cm diameter. Fusiform dilatation of left common iliac artery up to 2.7 cm diameter with some eccentric nonocclusive mural thrombus. Tortuous ectatic distal iliac arterial systems without significant stenosis. Proximal Outflow: Bilateral common femoral and visualized portions of the superficial and profunda femoral arteries are patent without evidence of aneurysm, dissection, vasculitis or significant stenosis. Veins: Apparent splenic venous occlusion with collaterals from the splenic hilum extending along the anterior mesentery. Streak artifact from GDA embolization coils obscures visualization of the portosplenic confluence. Main portal vein and intrahepatic branches are patent. Hepatic veins patent. Review of the MIP images confirms the above findings. NON-VASCULAR Lower chest: Chronic atelectasis/scarring in the left lower lobe. No pleural or pericardial effusion. Hepatobiliary: No focal liver abnormality is seen. No gallstones, gallbladder wall thickening, or biliary dilatation. Pancreas: Decreased enhancement in the mid pancreatic body. Atrophy in the pancreatic tail. Some image degradation through the pancreatic head secondary to regional embolization coils. Spleen: Normal in size without focal lesion. Adrenals/Urinary Tract: Adrenal glands are unremarkable. Kidneys are normal, without renal calculi, focal lesion, or  hydronephrosis. Bladder is unremarkable. Stomach/Bowel: Stomach is nondilated. Gastrojejunostomy staple line in the lateral gastric body. No evidence of active extravasation. Small bowel is decompressed. Moderate proximal colonic and rectal fecal material without dilatation. Lymphatic: No definite abdominal or pelvic adenopathy, evaluation limited secondary to paucity of fat and diffuse anasarca. Reproductive: Prostate is unremarkable. Other: No significant ascites. Scattered bubbles of free intraperitoneal air presumably postoperative. Musculoskeletal: Midline anterior abdominal wall skin staples. Lumbosacral spondylitic change. No fracture or worrisome bone lesion. IMPRESSION: VASCULAR 1. No evidence of active extravasation. 2. 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 3. 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 NON-VASCULAR 1. No acute findings. 2. Decreased enhancement in the mid pancreatic body suggesting residual/recurrent neoplasm. Electronically Signed   By: Lucrezia Europe M.D.   On: 11/10/2018 16:46     Antimicrobials:   none    Subjective: Patient did well overnight.  Dressing taken down this morning some light bleeding nothing significant.  Otherwise patient resting in bed comfortably pain-free  Objective: Vitals:   11/27/18 0900 11/27/18 2136 11/28/18 0436 11/28/18 0743  BP: (!) 126/102 120/88 (!) 130/99 (!) 119/97  Pulse: 91 97 98 95  Resp:  14 18   Temp: 97.6 F (36.4 C) 98.5 F (36.9 C) 98 F (36.7 C) 97.6 F (36.4 C)  TempSrc: Oral Oral Oral Oral  SpO2: 100% 100% 100% 100%  Weight:      Height:        Intake/Output Summary (Last 24 hours) at 11/28/2018 1131 Last data filed at 11/28/2018 0732 Gross per 24 hour  Intake -  Output 152 ml  Net -152 ml   Filed Weights   11/02/18 1806 11/04/18 0831 11/12/18 0742  Weight: 56.2 kg 56.2 kg 56.2 kg     Examination:  General exam: Appears calm and comfortable  Respiratory system: Clear to auscultation. Respiratory effort normal. Cardiovascular system: S1 & S2 heard,  RRR. No JVD, murmurs, rubs, gallops or clicks. No pedal edema. Gastrointestinal system: Abdomen is nondistended, soft and nontender. No organomegaly or masses felt. Normal bowel sounds heard. Central nervous system: Alert and oriented. No focal neurological deficits. Extremities: Symmetric 5 x 5 power, s/p r bka, no bleed thru, moving extremity freely Skin: No rashes, lesions or ulcers Psychiatry: Judgement and insight appear normal. Mood & affect appropriate.     Data Reviewed: I have personally reviewed following labs and imaging studies  CBC: Recent Labs  Lab 11/23/18 1042 11/24/18 0347 11/25/18 0521 11/26/18 0249 11/27/18 0423 11/28/18 0418  WBC 12.4* 9.7 8.2 10.2 8.0 8.8  NEUTROABS 10.5*  --   --   --   --   --   HGB 7.7* 7.8* 7.4* 9.7* 9.7* 10.2*  HCT 24.4* 24.9* 23.2* 30.4* 30.6* 32.2*  MCV 90.7 91.2 91.3 89.7 90.5 90.2  PLT 246 227 256 335 350 272   Basic Metabolic Panel: Recent Labs  Lab 11/22/18 0352 11/23/18 0447 11/26/18 0249  NA 132* 134*  --   K 3.4* 3.4*  --   CL 96* 99  --   CO2 24 30  --   GLUCOSE 142* 209*  --   BUN 11 14  --   CREATININE 0.47* 0.40* 0.48*  CALCIUM 8.0* 8.4*  --    GFR: Estimated Creatinine Clearance: 81 mL/min (A) (by C-G formula based on SCr of 0.48 mg/dL (L)). Liver Function Tests: Recent Labs  Lab 11/23/18 0447  AST 28  ALT 29  ALKPHOS 84  BILITOT 0.5  PROT 6.5  ALBUMIN 1.8*   No results for input(s): LIPASE, AMYLASE in the last 168 hours. No results for input(s): AMMONIA in the last 168 hours. Coagulation Profile: No results for input(s): INR, PROTIME in the last 168 hours. Cardiac Enzymes: No results for input(s): CKTOTAL, CKMB, CKMBINDEX, TROPONINI in the last 168 hours. BNP (last 3 results) No results for input(s): PROBNP in the last 8760  hours. HbA1C: No results for input(s): HGBA1C in the last 72 hours. CBG: Recent Labs  Lab 11/27/18 0758 11/27/18 1139 11/27/18 1712 11/27/18 2134 11/28/18 0740  GLUCAP 183* 197* 156* 228* 128*   Lipid Profile: No results for input(s): CHOL, HDL, LDLCALC, TRIG, CHOLHDL, LDLDIRECT in the last 72 hours. Thyroid Function Tests: No results for input(s): TSH, T4TOTAL, FREET4, T3FREE, THYROIDAB in the last 72 hours. Anemia Panel: No results for input(s): VITAMINB12, FOLATE, FERRITIN, TIBC, IRON, RETICCTPCT in the last 72 hours. Sepsis Labs: No results for input(s): PROCALCITON, LATICACIDVEN in the last 168 hours.  Recent Results (from the past 240 hour(s))  Surgical pcr screen     Status: None   Collection Time: 11/18/18  2:58 PM  Result Value Ref Range Status   MRSA, PCR NEGATIVE NEGATIVE Final   Staphylococcus aureus NEGATIVE NEGATIVE Final    Comment: (NOTE) The Xpert SA Assay (FDA approved for NASAL specimens in patients 28 years of age and older), is one component of a comprehensive surveillance program. It is not intended to diagnose infection nor to guide or monitor treatment. Performed at [facility], [place] [address] [place], [place] [address]          Radiology Studies: No results found.      Scheduled Meds: . acetaminophen  1,000 mg Oral Q8H  . apixaban  5 mg Oral BID  . docusate sodium  100 mg Oral Daily  . feeding supplement (ENSURE ENLIVE)  237 mL Oral TID BM  .  insulin aspart  0-5 Units Subcutaneous QHS  . insulin aspart  0-9 Units Subcutaneous TID WC  . pantoprazole  40 mg Oral BID  . polyethylene glycol  17 g Oral Daily  . sodium chloride flush  10-40 mL Intracatheter Q12H  . sucralfate  1 g Oral TID WC & HS   Continuous Infusions: . magnesium sulfate 1 - 4 g bolus IVPB       LOS: 86 days    Time spent: 35 min    Nicolette Bang, MD [facility]  If 7PM-7AM, please contact night-coverage  11/28/2018, 11:31 AM

## 2018-11-29 LAB — CBC
HCT: 31.1 % — ABNORMAL LOW (ref 39.0–52.0)
Hemoglobin: 10.2 g/dL — ABNORMAL LOW (ref 13.0–17.0)
MCH: 29.6 pg (ref 26.0–34.0)
MCHC: 32.8 g/dL (ref 30.0–36.0)
MCV: 90.1 fL (ref 80.0–100.0)
Platelets: 384 10*3/uL (ref 150–400)
RBC: 3.45 MIL/uL — ABNORMAL LOW (ref 4.22–5.81)
RDW: 14.6 % (ref 11.5–15.5)
WBC: 9.8 10*3/uL (ref 4.0–10.5)
nRBC: 0 % (ref 0.0–0.2)

## 2018-11-29 LAB — GLUCOSE, CAPILLARY
GLUCOSE-CAPILLARY: 179 mg/dL — AB (ref 70–99)
Glucose-Capillary: 192 mg/dL — ABNORMAL HIGH (ref 70–99)
Glucose-Capillary: 110 mg/dL — ABNORMAL HIGH (ref 70–99)
Glucose-Capillary: 275 mg/dL — ABNORMAL HIGH (ref 70–99)

## 2018-11-29 NOTE — Progress Notes (Signed)
[facility] [place]  Subjective  - No new complaitns   Objective (!) 119/91 (!) 105 98.3 F (36.8 C) (Oral) 18 100%  Intake/Output Summary (Last 24 hours) at 11/29/2018 0826 Last data filed at 11/28/2018 1856 Gross per 24 hour  Intake -  Output 100 ml  Net -100 ml   Right BKA healing well with minimal bloody drainage at staple excision site.  Nursing changed dressing 5 min. Ago.   Assessment/Planning: S/P right AKA  Pending SNF placement F/U with Our office in 4 weeks from surgery   Roxy Horseman 11/29/2018 8:26 AM --  Laboratory Lab Results: Recent Labs    11/28/18 0418 11/29/18 0325  WBC 8.8 9.8  HGB 10.2* 10.2*  HCT 32.2* 31.1*  PLT 385 384   BMET No results for input(s): NA, K, CL, CO2, GLUCOSE, BUN, CREATININE, CALCIUM in the last 72 hours.  COAG Lab Results  Component Value Date   INR 1.31 11/02/2018   INR 1.15 10/11/2018   INR 1.23 10/02/2018   No results found for: PTT

## 2018-11-29 NOTE — Progress Notes (Signed)
PT Cancellation Note  Patient Details Name: Ryan Medina MRN: [id number] DOB: 1961/02/02   Cancelled Treatment:    Reason Eval/Treat Not Completed: Pain limiting ability to participate. Pt reports just received pain medication. PT will continue to follow acutely.    Salina April, PTA Acute Rehabilitation Services Pager: [phone] Office: [phone]   11/29/2018, 3:51 PM

## 2018-11-29 NOTE — Progress Notes (Signed)
PROGRESS NOTE    Ryan Medina  XBJ:[id number] DOB: 04-05-1961 DOA: 09/03/2018 PCP: Patient, No Pcp Per   Brief Narrative:  Per admitting physician: Ryan Medina is a84 y.o.M with DM, HTN, sCHF EF 45% and polysubstance abuse/homelessness who presented initially with left leg pain on 12/13, found to have acute thromboembolic ischemic left leg. Taken emergently for embolectomy.  Post-op course complicated. Of primary importance, found to have inoperable pancreatic cancer. In brief, right leg progressively worse, ultimately gangrenous requiring BKA; difficult balance between -- recurrent thromboembolic events due to malignancy vs. severe and recurrent GIB from duodenal ulcer (erosion from malignancy), all punctuated by pneumonia sepsis, now resolved.   Assessment & Plan:   Principal Problem:   Popliteal artery embolus (HCC) Active Problems:   DM (diabetes mellitus), type 2, uncontrolled (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   Pancreatic adenocarcinoma ([place])   Palliative care by specialist   Protein-calorie malnutrition, severe   Abdominal distention   Pain   HCAP (healthcare-associated pneumonia)   Acute lower UTI   Sepsis ([place])   Homelessness   Weakness generalized   Gastric outflow obstruction   Hematochezia   BRBPR (bright red blood per rectum)   Acute gastrojejunal anastomotic ulcer   Gangrene of right foot ([place])   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   Gastric outlet obstruction Severe protein calorie malnutrition S/p gastrojejunostomy No change -Consulteddietitian, appreciate their recs -Continue Ensure -Tolerating diet without complication  Acute blood loss anemia on anemia of chronic disease Hematochezia Malignant duodenal ulcer Anemia of iron deficiency Hgbstable at 9.7Likely this is iron deficiency superimposed on marrow loss (amputation) and previous blood loss. No evid of new bleeding -Feraheme3/3,c/woral iron -ContinuePPI and sucralfate  Pacreatic adenoCA -Nonoperative, palliative care involved 2/2 advanced nature for sx control  Candida esophagitis - appears resolved, no sx  Pulmonary embolism GI bleeding appears stopped. Patient refused Lovenox. Discussed with Dr. Marin Olp and patient, Eliquis is a suboptimal alternative, but patient will accept. -c/wEliquis  Chronic systolic and diastolic CHF Stable,no evidence of volume overload  Diabetes Hyperglycemic -Continue withLevemir 4 QHS -Continue SSI  Sepsis from HCAP, Pseudomonas UTI Treated with Cefepime 1/11-1/16, fever resolved.  MDM and disposition: The below labs and imaging reports were reviewed and summarized above. Medication management as above. Including anticoagulation.  The patient was admitted with an ischemic leg, found to have pancreatic cancer and hypercoagulability causing multiple complications, including a prolonged gastrointestinal hemorrhage.   His GI bleeding is essentially resolved now, and he has been stable. He finally had to undergo right leg BKA due to gangrene, and he is progressing well from thisalthough vascular surgery is following closely secondary to some bleeding at wound site  The patient at this point in time will need  rehabilitation from his right BKA as well as malnutrition,  as well as frequent lab monitoring.PT/OT evaluations performed. SNF recommended. SNF appropriate as the patient has received 3 days of hospital care (or the 3 day period has been waived by the patient's insurance company) and is felt to need rehab services to restore this patient to their prior level of function to achieve safe transition back to home care. This patient needs rehab services for at least 5 days per week and skilled nursing services daily to facilitate this transition. Rehab is being requested as the most appropriate d/c option for this patient and is NOT felt to be for custodial care as evidenced bypatient's ability to ambulate as well as perform ADLs prior to admission.  DVT prophylaxis: HW:[id number]  Code Status: full    Code Status Orders  (From admission, onward)         Start     Ordered   09/07/18 1850  Limited resuscitation (code)  Continuous    Question Answer Comment  In the event of cardiac or respiratory ARREST: Initiate Code Blue, Call Rapid Response No   In the event of cardiac or respiratory ARREST: Perform CPR No   In the event of cardiac or respiratory ARREST: Perform Intubation/Mechanical Ventilation Yes   In the event of cardiac or respiratory ARREST: Use NIPPV/BiPAp only if indicated No   In the event of cardiac or respiratory ARREST: Administer ACLS medications if indicated No   In the event of cardiac or respiratory ARREST: Perform Defibrillation or Cardioversion if indicated No   Comments Continue current care with mechanical ventilation and vasopressor support.      09/07/18 1850        Code Status History    Date Active Date Inactive Code Status Order ID Comments User Context   09/03/2018 2251 09/07/2018 1850 Full Code [id number]  [name], MD Inpatient   09/03/2018 2236 09/03/2018 2251 Full Code [id number]  Iline Oven Inpatient   08/22/2018 2203 08/25/2018 1655 Full  Code [id number]  Ivor Costa, MD ED   01/30/2018 0308 01/31/2018 1807 Full Code [id number]  [name], MD ED   05/19/2016 1029 05/19/2016 1721 Full Code [id number]  Leonie Man, MD Inpatient     Family Communication: None present Disposition Plan:   SNF pending Consults called: None Admission status: Inpatient   Consultants:   Cardiology, general surgery, wound care, palliative care, vascular surgery  Procedures:  Dg Abd 1 View  Result Date: 11/07/2018 CLINICAL DATA:  Abdominal pain. EXAM: ABDOMEN - 1 VIEW COMPARISON:  October 29, 2018 FINDINGS: Moderate fecal loading in the colon. No evidence of bowel obstruction. There is a paucity of small bowel gas but no evidence of obstruction. No other acute abnormalities. IMPRESSION: Moderate fecal loading in the colon. No other acute abnormalities noted. Electronically Signed   By: Dorise Bullion III M.D   On: 11/07/2018 18:37   Ct Abdomen Pelvis W Contrast  Result Date: 11/25/2018 CLINICAL DATA:  Inpatient. Nonoperable malignant-appearing pancreatic mass diagnosed December 2019. Restaging. EXAM: CT ABDOMEN AND PELVIS WITH CONTRAST TECHNIQUE: Multidetector CT imaging of the abdomen and pelvis was performed using the standard protocol following bolus administration of intravenous contrast. CONTRAST:  112mL OMNIPAQUE IOHEXOL 300 MG/ML  SOLN COMPARISON:  11/10/2018 CT angiogram of the abdomen and pelvis. FINDINGS: Lower chest: No significant pulmonary nodules or acute consolidative airspace disease. Stable mildly thickened parenchymal band at the left lung base compatible with postinfectious/postinflammatory scarring. Tip of superior approach Port-A-Cath is seen at the cavoatrial junction. Hepatobiliary: Normal liver  size. Simple 1.0 cm lateral segment left liver lobe cyst is stable. No new liver lesions. Normal gallbladder with no radiopaque cholelithiasis. No biliary ductal dilatation. Pancreas: Poorly marginated heterogeneous hypoenhancing 5.5  x 4.0 cm pancreatic body mass (series 3/image 24), previously 5.3 x 3.9 cm on 11/10/2018 using similar measurement technique, stable to minimally increased. Stable atrophy and duct dilation in the pancreatic tail. Stable encasement of the celiac trunk by the mass. At least partial anterior encasement of the proximal SMA by the mass. Spleen: Normal size. No mass. Adrenals/Urinary Tract: Normal adrenals. Nonobstructing 3 mm lower right renal stone. Stable scattered subcentimeter hypodense renal cortical lesions in both kidneys. No new renal lesions. No hydronephrosis. Normal bladder. Stomach/Bowel: Stomach is nondistended. Stable gastric fold thickening in the fundus and proximal body of the stomach. Normal caliber small bowel with no small bowel wall thickening. Appendectomy. Oral contrast transits to the left colon. Mild-to-moderate colonic stool with no large bowel wall thickening, colonic diverticulosis or significant pericolonic fat stranding. Vascular/Lymphatic: Atherosclerotic abdominal aorta with stable 3.3 cm infrarenal abdominal aortic aneurysm. 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 Stable 2.9 cm left common iliac artery aneurysm. Patent hepatic, portal and renal veins. Stable occlusion of splenic vein at the level of the pancreatic tail. Redemonstration of embolization coils in the gastroduodenal artery. Stable nonocclusive thrombosis of the right common femoral vein. Mildly enlarged 1.3 cm right external iliac node (series 3/image 71), increased from 1.0 cm. No additional pathologically enlarged lymph nodes in the abdomen or pelvis. Reproductive: Normal size prostate. Other: No pneumoperitoneum, ascites or focal fluid collection. Progressive cachexia, particularly compared to 12/19/2017 CT study. Musculoskeletal: No aggressive appearing focal osseous lesions. Moderate lower lumbar spondylosis. IMPRESSION: 1. Locally advanced 5.5 cm pancreatic body mass is stable to slightly increased since  11/10/2018 CT. 2. Mild right external iliac lymphadenopathy is mildly increased, nonspecific, could be reactive or metastatic. 3. No additional potential sites of metastatic disease. 4. Stable 3.3 cm Abdominal Aortic Aneurysm (ICD10-I71.9). Stable bilateral common iliac artery aneurysms. 5. Stable nonocclusive DVT in the right common femoral vein. 6. Progressive cachexia. Electronically Signed   By: Ilona Sorrel M.D.   On: 11/25/2018 16:52   [place] Ekg Site Rite  Result Date: 11/05/2018 If Site Rite image not attached, placement could not be confirmed due to current cardiac rhythm.  Ct Angio Abd/pel W/ And/or W/o  Result Date: 11/10/2018 CLINICAL DATA:  Pancreatic carcinoma with partial duodenal obstruction, gastric varices. GI bleed since gastrojejunostomy on 11/04/18^ EXAM: CTA ABDOMEN AND PELVIS wITHOUT AND WITH CONTRAST TECHNIQUE: Multidetector CT imaging of the abdomen and pelvis was performed using the standard protocol during bolus administration of intravenous contrast. Multiplanar reconstructed images and MIPs were obtained and reviewed to evaluate the vascular anatomy. CONTRAST:  122mL ISOVUE-370 IOPAMIDOL (ISOVUE-370) INJECTION 76% COMPARISON:  10/27/2018 FINDINGS: VASCULAR Aorta: Fusiform abdominal aortic aneurysm measured up to 3.1 cm maximum diameter, stable. No significant intraluminal thrombus. No dissection or stenosis. Celiac: Patent. The left gastric artery arises directly from the abdominal aorta, an anatomic variant. Metallic gastroduodenal embolization coils without evidence of recanalization. SMA: Patent without evidence of aneurysm, dissection, vasculitis or significant stenosis. Renals: Both renal arteries are patent without evidence of aneurysm, dissection, vasculitis, fibromuscular dysplasia or significant stenosis. IMA: Short-segment origin stenosis. Distally patent without evidence of aneurysm, dissection, vasculitis or significant stenosis. Inflow: Fusiform dilatation of the right  common iliac artery up to 2.9 cm diameter. Fusiform dilatation of left common iliac artery up to 2.7 cm diameter with  some eccentric nonocclusive mural thrombus. Tortuous ectatic distal iliac arterial systems without significant stenosis. Proximal Outflow: Bilateral common femoral and visualized portions of the superficial and profunda femoral arteries are patent without evidence of aneurysm, dissection, vasculitis or significant stenosis. Veins: Apparent splenic venous occlusion with collaterals from the splenic hilum extending along the anterior mesentery. Streak artifact from GDA embolization coils obscures visualization of the portosplenic confluence. Main portal vein and intrahepatic branches are patent. Hepatic veins patent. Review of the MIP images confirms the above findings. NON-VASCULAR Lower chest: Chronic atelectasis/scarring in the left lower lobe. No pleural or pericardial effusion. Hepatobiliary: No focal liver abnormality is seen. No gallstones, gallbladder wall thickening, or biliary dilatation. Pancreas: Decreased enhancement in the mid pancreatic body. Atrophy in the pancreatic tail. Some image degradation through the pancreatic head secondary to regional embolization coils. Spleen: Normal in size without focal lesion. Adrenals/Urinary Tract: Adrenal glands are unremarkable. Kidneys are normal, without renal calculi, focal lesion, or hydronephrosis. Bladder is unremarkable. Stomach/Bowel: Stomach is nondilated. Gastrojejunostomy staple line in the lateral gastric body. No evidence of active extravasation. Small bowel is decompressed. Moderate proximal colonic and rectal fecal material without dilatation. Lymphatic: No definite abdominal or pelvic adenopathy, evaluation limited secondary to paucity of fat and diffuse anasarca. Reproductive: Prostate is unremarkable. Other: No significant ascites. Scattered bubbles of free intraperitoneal air presumably postoperative. Musculoskeletal: Midline  anterior abdominal wall skin staples. Lumbosacral spondylitic change. No fracture or worrisome bone lesion. IMPRESSION: VASCULAR 1. No evidence of active extravasation. 2. 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 3. 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 NON-VASCULAR 1. No acute findings. 2. Decreased enhancement in the mid pancreatic body suggesting residual/recurrent neoplasm. Electronically Signed   By: Lucrezia Europe M.D.   On: 11/10/2018 16:46     Antimicrobials:   None   Subjective: No acute events overnight, no more bleeding from stump site, awaiting skilled nursing facility  Objective: Vitals:   11/28/18 0743 11/28/18 2020 11/29/18 0630 11/29/18 0657  BP: (!) 119/97 (!) 131/100 (!) 124/106 (!) 119/91  Pulse: 95 96 (!) 52 (!) 105  Resp:  18 18   Temp: 97.6 F (36.4 C) 98.3 F (36.8 C) 98.3 F (36.8 C)   TempSrc: Oral Oral Oral   SpO2: 100% 100% 100%   Weight:      Height:        Intake/Output Summary (Last 24 hours) at 11/29/2018 1256 Last data filed at 11/28/2018 1856 Gross per 24 hour  Intake -  Output 100 ml  Net -100 ml   Filed Weights   11/02/18 1806 11/04/18 0831 11/12/18 0742  Weight: 56.2 kg 56.2 kg 56.2 kg    Examination:  General exam: Appears calm and comfortable  Respiratory system: Clear to auscultation. Respiratory effort normal. Cardiovascular system: S1 & S2 heard, RRR. No JVD, murmurs, rubs, gallops or clicks. No pedal edema. Gastrointestinal system: Abdomen is nondistended, soft and nontender. No organomegaly or masses felt. Normal bowel sounds heard. Central nervous system: Alert and oriented. No focal neurological deficits. Extremities: Symmetric 5 x 5 power.  Right BKA without bleeding, Skin: No rashes, lesions or ulcers Psychiatry: Judgement and insight appear normal. Mood & affect appropriate.     Data Reviewed: I have personally  reviewed following labs and imaging studies  CBC: Recent Labs  Lab 11/23/18 1042  11/25/18 0521 11/26/18 0249 11/27/18 0423 11/28/18 0418 11/29/18 0325  WBC 12.4*   < >  8.2 10.2 8.0 8.8 9.8  NEUTROABS 10.5*  --   --   --   --   --   --   HGB 7.7*   < > 7.4* 9.7* 9.7* 10.2* 10.2*  HCT 24.4*   < > 23.2* 30.4* 30.6* 32.2* 31.1*  MCV 90.7   < > 91.3 89.7 90.5 90.2 90.1  PLT 246   < > 256 335 350 385 384   < > = values in this interval not displayed.   Basic Metabolic Panel: Recent Labs  Lab 11/23/18 0447 11/26/18 0249  NA 134*  --   K 3.4*  --   CL 99  --   CO2 30  --   GLUCOSE 209*  --   BUN 14  --   CREATININE 0.40* 0.48*  CALCIUM 8.4*  --    GFR: Estimated Creatinine Clearance: 81 mL/min (A) (by C-G formula based on SCr of 0.48 mg/dL (L)). Liver Function Tests: Recent Labs  Lab 11/23/18 0447  AST 28  ALT 29  ALKPHOS 84  BILITOT 0.5  PROT 6.5  ALBUMIN 1.8*   No results for input(s): LIPASE, AMYLASE in the last 168 hours. No results for input(s): AMMONIA in the last 168 hours. Coagulation Profile: No results for input(s): INR, PROTIME in the last 168 hours. Cardiac Enzymes: No results for input(s): CKTOTAL, CKMB, CKMBINDEX, TROPONINI in the last 168 hours. BNP (last 3 results) No results for input(s): PROBNP in the last 8760 hours. HbA1C: No results for input(s): HGBA1C in the last 72 hours. CBG: Recent Labs  Lab 11/28/18 0740 11/28/18 1154 11/28/18 1712 11/28/18 2221 11/29/18 0831  GLUCAP 128* 124* 119* 267* 192*   Lipid Profile: No results for input(s): CHOL, HDL, LDLCALC, TRIG, CHOLHDL, LDLDIRECT in the last 72 hours. Thyroid Function Tests: No results for input(s): TSH, T4TOTAL, FREET4, T3FREE, THYROIDAB in the last 72 hours. Anemia Panel: No results for input(s): VITAMINB12, FOLATE, FERRITIN, TIBC, IRON, RETICCTPCT in the last 72 hours. Sepsis Labs: No results for input(s): PROCALCITON, LATICACIDVEN in the last 168 hours.  No results found  for this or any previous visit (from the past 240 hour(s)).       Radiology Studies: No results found.      Scheduled Meds: . acetaminophen  1,000 mg Oral Q8H  . apixaban  5 mg Oral BID  . docusate sodium  100 mg Oral Daily  . feeding supplement (ENSURE ENLIVE)  237 mL Oral TID BM  . insulin aspart  0-5 Units Subcutaneous QHS  . insulin aspart  0-9 Units Subcutaneous TID WC  . pantoprazole  40 mg Oral BID  . polyethylene glycol  17 g Oral Daily  . sodium chloride flush  10-40 mL Intracatheter Q12H  . sucralfate  1 g Oral TID WC & HS   Continuous Infusions: . magnesium sulfate 1 - 4 g bolus IVPB       LOS: 87 days    Time spent: 35 min    Nicolette Bang, MD [facility]  If 7PM-7AM, please contact night-coverage  11/29/2018, 12:56 PM

## 2018-11-30 LAB — GLUCOSE, CAPILLARY
GLUCOSE-CAPILLARY: 169 mg/dL — AB (ref 70–99)
Glucose-Capillary: 164 mg/dL — ABNORMAL HIGH (ref 70–99)
Glucose-Capillary: 136 mg/dL — ABNORMAL HIGH (ref 70–99)
Glucose-Capillary: 240 mg/dL — ABNORMAL HIGH (ref 70–99)

## 2018-11-30 LAB — CBC
HCT: 32.6 % — ABNORMAL LOW (ref 39.0–52.0)
Hemoglobin: 10.4 g/dL — ABNORMAL LOW (ref 13.0–17.0)
MCH: 28.7 pg (ref 26.0–34.0)
MCHC: 31.9 g/dL (ref 30.0–36.0)
MCV: 90.1 fL (ref 80.0–100.0)
Platelets: 358 10*3/uL (ref 150–400)
RBC: 3.62 MIL/uL — ABNORMAL LOW (ref 4.22–5.81)
RDW: 14.6 % (ref 11.5–15.5)
WBC: 8.3 10*3/uL (ref 4.0–10.5)
nRBC: 0 % (ref 0.0–0.2)

## 2018-11-30 NOTE — Plan of Care (Signed)
  Problem: Pain Managment: Goal: General experience of comfort will improve Outcome: Progressing   Problem: Skin Integrity: Goal: Risk for impaired skin integrity will decrease Outcome: Progressing   

## 2018-11-30 NOTE — Progress Notes (Signed)
   VASCULAR SURGERY ASSESSMENT & PLAN:   11 Days Post-Op s/p: Right BKA. I evacuated a large amount of old hematoma again today. Orders are written for BID dressing changes. This area needs to be packed with a moist 4X4, then wrapped with Kerlix and a 4" ace BID  SUBJECTIVE:   No complaints  PHYSICAL EXAM:   Vitals:   11/29/18 1616 11/29/18 2004 11/30/18 0521 11/30/18 0543  BP: 107/83 112/75 (!) 125/106 (!) 129/96  Pulse: (!) 101 98 89 95  Resp:  18 20   Temp: 98.2 F (36.8 C) 97.6 F (36.4 C)    TempSrc: Oral Oral    SpO2: 100% 100% 100%   Weight:      Height:       Wound continutes to improve but the open area needs to be packed BID. I put almost an entire 4X4 into the wound.   LABS:   Lab Results  Component Value Date   WBC 8.3 11/30/2018   HGB 10.4 (L) 11/30/2018   HCT 32.6 (L) 11/30/2018   MCV 90.1 11/30/2018   PLT 358 11/30/2018   Lab Results  Component Value Date   CREATININE 0.48 (L) 11/26/2018   Lab Results  Component Value Date   INR 1.31 11/02/2018   CBG (last 3)  Recent Labs    11/29/18 2136 11/30/18 0727 11/30/18 1127  GLUCAP 179* 164* 136*    PROBLEM LIST:    Principal Problem:   Popliteal artery embolus (HCC) Active Problems:   DM (diabetes mellitus), type 2, uncontrolled (HCC)   Essential hypertension   Dilated cardiomyopathy (HCC)   Chest pain   AAA (abdominal aortic aneurysm) (HCC)   Ischemic foot   Right leg claudication (HCC)   PVD (peripheral vascular disease) ([place])   Hemorrhagic shock ([place])   Nontraumatic ischemic infarction of muscle of right lower leg   Upper GI bleed   Cocaine abuse ([place])   Acute blood loss anemia   Chronic pain syndrome   Duodenal ulcer with hemorrhage   Pancreatic cancer ([place])   Goals of care, counseling/discussion   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 acetaminophen  1,000 mg Oral Q8H  . apixaban  5 mg Oral BID  . docusate sodium  100 mg Oral Daily  . feeding supplement (ENSURE ENLIVE)  237 mL Oral TID BM  . insulin aspart  0-5 Units Subcutaneous QHS  . insulin aspart  0-9 Units Subcutaneous TID WC  . pantoprazole  40 mg Oral BID  . polyethylene glycol  17 g Oral Daily  . sodium chloride flush  10-40 mL Intracatheter Q12H  . sucralfate  1 g Oral TID WC & HS    [name] Beeper: [phone] Office: [phone] 11/30/2018

## 2018-11-30 NOTE — Progress Notes (Signed)
PROGRESS NOTE    Ryan Medina  WVP:[id number] DOB: June 07, 1961 DOA: 09/03/2018 PCP: Patient, No Pcp Per   Brief Narrative:   See additional full summary of additional progress note on 3/10 for more details:  Per admitting physician: Mr. Branscom is a57 y.o.M with DM, HTN, sCHF EF 45% and polysubstance abuse/homelessness who presented initially with left leg pain on 12/13, found to have acute thromboembolic ischemic left leg. Taken emergently for embolectomy.  Post-op course complicated. Of primary importance, found to have inoperable pancreatic cancer. In brief, right leg progressively worse, ultimately gangrenous requiring BKA; difficult balance between -- recurrent thromboembolic events due to malignancy vs. severe and recurrent GIB from duodenal ulcer (erosion from malignancy), all punctuated by pneumonia sepsis, now resolved.   Assessment & Plan:   Principal Problem:   Popliteal artery embolus (HCC) Active Problems:   DM (diabetes mellitus), type 2, uncontrolled ([place])   Essential hypertension   Dilated cardiomyopathy (HCC)   Chest pain   AAA (abdominal aortic aneurysm) (HCC)   Ischemic foot   Right leg claudication (HCC)   PVD (peripheral vascular disease) ([place])   Hemorrhagic shock ([place])   Nontraumatic ischemic infarction of muscle of right lower leg   Upper GI bleed   Cocaine abuse ([place])   Acute blood loss anemia   Chronic pain syndrome   Duodenal ulcer with hemorrhage   Pancreatic cancer ([place])   Goals of care, counseling/discussion   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   Pancreatic adenocarcinoma ([place])   Palliative care by specialist   Protein-calorie malnutrition, severe   Abdominal distention   Pain   HCAP (healthcare-associated pneumonia)   Acute lower UTI   Sepsis ([place])   Homelessness   Weakness generalized   Gastric outflow obstruction   Hematochezia   BRBPR (bright red blood per rectum)   Acute gastrojejunal anastomotic ulcer  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   Gastric outlet obstruction Severe protein calorie malnutrition S/p gastrojejunostomy No change -Consulteddietitian, appreciate their recs -Continue Ensure -Tolerating diet without complication  Acute blood loss anemia on anemia of chronic disease Hematochezia Malignant duodenal ulcer Anemia of iron deficiency Hgbstable at 10.4Likely this is iron deficiency superimposed on marrow loss (amputation) and previous blood loss. No evid of new bleeding -Feraheme3/3,c/woral iron -ContinuePPI and sucralfate  Pacreatic adenoCA -Nonoperative, palliative care involved 2/2 advanced nature for sx control -min pain  Candida esophagitis - appears resolved, no sx  Pulmonary embolism GI bleeding appears stopped. Patient refused Lovenox. Discussed with Dr. Marin Olp and patient, Eliquis is a suboptimal alternative, but patient will accept. -c/wEliquis  Chronic systolic and diastolic CHF Stable,no evidence of volume overload  Diabetes Hyperglycemic -Continue withLevemir 4 QHS -Continue SSI  Sepsis from HCAP, Pseudomonas UTI Treated with Cefepime 1/11-1/16, fever resolved.  MDM and disposition: The below labs and imaging reports were reviewed and summarized above. Medication management as above. Including anticoagulation.  The patient was admitted with an ischemic leg, found to have pancreatic cancer and hypercoagulability causing multiple complications, including a prolonged gastrointestinal hemorrhage.   His GI bleeding is essentially resolved now, and he has been stable. He finally had to undergo right leg BKA due to gangrene, and he is progressing well from thisalthough vascular surgery is following  closely secondary to some bleeding at wound site  The patient at  this point in time will need rehabilitation from his right BKA as well as malnutrition, as well as frequent lab monitoring.PT/OT evaluations performed. SNF recommended. SNF appropriate as the patient has received 3 days of hospital care (or the 3 day period has been waived by the patient's insurance company) and is felt to need rehab services to restore this patient to their prior level of function to achieve safe transition back to home care. This patient needs rehab services for at least 5 days per week and skilled nursing services daily to facilitate this transition. Rehab is being requested as the most appropriate d/c option for this patient and is NOT felt to be for custodial care as evidenced bypatient's ability to ambulate as well as perform ADLs prior to admission.   DVT prophylaxis: IW:[id number]  Code Status: partial    Code Status Orders  (From admission, onward)         Start     Ordered   09/07/18 1850  Limited resuscitation (code)  Continuous    Question Answer Comment  In the event of cardiac or respiratory ARREST: Initiate Code Blue, Call Rapid Response No   In the event of cardiac or respiratory ARREST: Perform CPR No   In the event of cardiac or respiratory ARREST: Perform Intubation/Mechanical Ventilation Yes   In the event of cardiac or respiratory ARREST: Use NIPPV/BiPAp only if indicated No   In the event of cardiac or respiratory ARREST: Administer ACLS medications if indicated No   In the event of cardiac or respiratory ARREST: Perform Defibrillation or Cardioversion if indicated No   Comments Continue current care with mechanical ventilation and vasopressor support.      09/07/18 1850        Code Status History    Date Active Date Inactive Code Status Order ID Comments User Context   09/03/2018 2251 09/07/2018 1850 Full Code [id number]  [name], MD Inpatient   09/03/2018 2236  09/03/2018 2251 Full Code [id number]  Iline Oven Inpatient   08/22/2018 2203 08/25/2018 1655 Full Code [id number]  Ivor Costa, MD ED   01/30/2018 0308 01/31/2018 1807 Full Code [id number]  [name], MD ED   05/19/2016 1029 05/19/2016 1721 Full Code [id number]  Leonie Man, MD Inpatient     Family Communication: None present Disposition Plan:   Skilled nursing facility pending no accepting facility located at this point Consults called: None Admission status: Inpatient   Consultants:   Cardiology, general surgery, wound care, palliative care, vascular surgery  Procedures:  Dg Abd 1 View  Result Date: 11/07/2018 CLINICAL DATA:  Abdominal pain. EXAM: ABDOMEN - 1 VIEW COMPARISON:  October 29, 2018 FINDINGS: Moderate fecal loading in the colon. No evidence of bowel obstruction. There is a paucity of small bowel gas but no evidence of obstruction. No other acute abnormalities. IMPRESSION: Moderate fecal loading in the colon. No other acute abnormalities noted. Electronically Signed   By: Dorise Bullion III M.D   On: 11/07/2018 18:37   Ct Abdomen Pelvis W Contrast  Result Date: 11/25/2018 CLINICAL DATA:  Inpatient. Nonoperable malignant-appearing pancreatic mass diagnosed December 2019. Restaging. EXAM: CT ABDOMEN AND PELVIS WITH CONTRAST TECHNIQUE: Multidetector CT imaging of the abdomen and pelvis was performed using the standard protocol following bolus administration of intravenous contrast. CONTRAST:  149mL OMNIPAQUE IOHEXOL 300 MG/ML  SOLN COMPARISON:  11/10/2018 CT angiogram of the abdomen and pelvis. FINDINGS: Lower chest: No significant pulmonary nodules or acute consolidative airspace disease. Stable mildly thickened  parenchymal band at the left lung base compatible with postinfectious/postinflammatory scarring. Tip of superior approach Port-A-Cath is seen at the cavoatrial junction. Hepatobiliary: Normal liver size. Simple 1.0 cm lateral segment left liver lobe cyst is  stable. No new liver lesions. Normal gallbladder with no radiopaque cholelithiasis. No biliary ductal dilatation. Pancreas: Poorly marginated heterogeneous hypoenhancing 5.5 x 4.0 cm pancreatic body mass (series 3/image 24), previously 5.3 x 3.9 cm on 11/10/2018 using similar measurement technique, stable to minimally increased. Stable atrophy and duct dilation in the pancreatic tail. Stable encasement of the celiac trunk by the mass. At least partial anterior encasement of the proximal SMA by the mass. Spleen: Normal size. No mass. Adrenals/Urinary Tract: Normal adrenals. Nonobstructing 3 mm lower right renal stone. Stable scattered subcentimeter hypodense renal cortical lesions in both kidneys. No new renal lesions. No hydronephrosis. Normal bladder. Stomach/Bowel: Stomach is nondistended. Stable gastric fold thickening in the fundus and proximal body of the stomach. Normal caliber small bowel with no small bowel wall thickening. Appendectomy. Oral contrast transits to the left colon. Mild-to-moderate colonic stool with no large bowel wall thickening, colonic diverticulosis or significant pericolonic fat stranding. Vascular/Lymphatic: Atherosclerotic abdominal aorta with stable 3.3 cm infrarenal abdominal aortic aneurysm. 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 Stable 2.9 cm left common iliac artery aneurysm. Patent hepatic, portal and renal veins. Stable occlusion of splenic vein at the level of the pancreatic tail. Redemonstration of embolization coils in the gastroduodenal artery. Stable nonocclusive thrombosis of the right common femoral vein. Mildly enlarged 1.3 cm right external iliac node (series 3/image 71), increased from 1.0 cm. No additional pathologically enlarged lymph nodes in the abdomen or pelvis. Reproductive: Normal size prostate. Other: No pneumoperitoneum, ascites or focal fluid collection. Progressive cachexia, particularly compared to 12/19/2017 CT study. Musculoskeletal: No  aggressive appearing focal osseous lesions. Moderate lower lumbar spondylosis. IMPRESSION: 1. Locally advanced 5.5 cm pancreatic body mass is stable to slightly increased since 11/10/2018 CT. 2. Mild right external iliac lymphadenopathy is mildly increased, nonspecific, could be reactive or metastatic. 3. No additional potential sites of metastatic disease. 4. Stable 3.3 cm Abdominal Aortic Aneurysm (ICD10-I71.9). Stable bilateral common iliac artery aneurysms. 5. Stable nonocclusive DVT in the right common femoral vein. 6. Progressive cachexia. Electronically Signed   By: Ilona Sorrel M.D.   On: 11/25/2018 16:52   [place] Ekg Site Rite  Result Date: 11/05/2018 If Site Rite image not attached, placement could not be confirmed due to current cardiac rhythm.  Ct Angio Abd/pel W/ And/or W/o  Result Date: 11/10/2018 CLINICAL DATA:  Pancreatic carcinoma with partial duodenal obstruction, gastric varices. GI bleed since gastrojejunostomy on 11/04/18^ EXAM: CTA ABDOMEN AND PELVIS wITHOUT AND WITH CONTRAST TECHNIQUE: Multidetector CT imaging of the abdomen and pelvis was performed using the standard protocol during bolus administration of intravenous contrast. Multiplanar reconstructed images and MIPs were obtained and reviewed to evaluate the vascular anatomy. CONTRAST:  171mL ISOVUE-370 IOPAMIDOL (ISOVUE-370) INJECTION 76% COMPARISON:  10/27/2018 FINDINGS: VASCULAR Aorta: Fusiform abdominal aortic aneurysm measured up to 3.1 cm maximum diameter, stable. No significant intraluminal thrombus. No dissection or stenosis. Celiac: Patent. The left gastric artery arises directly from the abdominal aorta, an anatomic variant. Metallic gastroduodenal embolization coils without evidence of recanalization. SMA: Patent without evidence of aneurysm, dissection, vasculitis or significant stenosis. Renals: Both renal arteries are patent without evidence of aneurysm, dissection, vasculitis, fibromuscular dysplasia or significant  stenosis. IMA: Short-segment origin stenosis. Distally patent without evidence of aneurysm, dissection, vasculitis or significant stenosis. Inflow: Fusiform  dilatation of the right common iliac artery up to 2.9 cm diameter. Fusiform dilatation of left common iliac artery up to 2.7 cm diameter with some eccentric nonocclusive mural thrombus. Tortuous ectatic distal iliac arterial systems without significant stenosis. Proximal Outflow: Bilateral common femoral and visualized portions of the superficial and profunda femoral arteries are patent without evidence of aneurysm, dissection, vasculitis or significant stenosis. Veins: Apparent splenic venous occlusion with collaterals from the splenic hilum extending along the anterior mesentery. Streak artifact from GDA embolization coils obscures visualization of the portosplenic confluence. Main portal vein and intrahepatic branches are patent. Hepatic veins patent. Review of the MIP images confirms the above findings. NON-VASCULAR Lower chest: Chronic atelectasis/scarring in the left lower lobe. No pleural or pericardial effusion. Hepatobiliary: No focal liver abnormality is seen. No gallstones, gallbladder wall thickening, or biliary dilatation. Pancreas: Decreased enhancement in the mid pancreatic body. Atrophy in the pancreatic tail. Some image degradation through the pancreatic head secondary to regional embolization coils. Spleen: Normal in size without focal lesion. Adrenals/Urinary Tract: Adrenal glands are unremarkable. Kidneys are normal, without renal calculi, focal lesion, or hydronephrosis. Bladder is unremarkable. Stomach/Bowel: Stomach is nondilated. Gastrojejunostomy staple line in the lateral gastric body. No evidence of active extravasation. Small bowel is decompressed. Moderate proximal colonic and rectal fecal material without dilatation. Lymphatic: No definite abdominal or pelvic adenopathy, evaluation limited secondary to paucity of fat and diffuse  anasarca. Reproductive: Prostate is unremarkable. Other: No significant ascites. Scattered bubbles of free intraperitoneal air presumably postoperative. Musculoskeletal: Midline anterior abdominal wall skin staples. Lumbosacral spondylitic change. No fracture or worrisome bone lesion. IMPRESSION: VASCULAR 1. No evidence of active extravasation. 2. 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 3. 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 NON-VASCULAR 1. No acute findings. 2. Decreased enhancement in the mid pancreatic body suggesting residual/recurrent neoplasm. Electronically Signed   By: Lucrezia Europe M.D.   On: 11/10/2018 16:46     Antimicrobials:   None completed cefepime from 1/11-1/16   Subjective: No acute events overnight, resting in bed.  Awake alert answering questions appropriately.  Reports minimal abdominal pain.  Objective: Vitals:   11/29/18 1616 11/29/18 2004 11/30/18 0521 11/30/18 0543  BP: 107/83 112/75 (!) 125/106 (!) 129/96  Pulse: (!) 101 98 89 95  Resp:  18 20   Temp: 98.2 F (36.8 C) 97.6 F (36.4 C)    TempSrc: Oral Oral    SpO2: 100% 100% 100%   Weight:      Height:        Intake/Output Summary (Last 24 hours) at 11/30/2018 1048 Last data filed at 11/30/2018 0900 Gross per 24 hour  Intake 720 ml  Output 100 ml  Net 620 ml   Filed Weights   11/02/18 1806 11/04/18 0831 11/12/18 0742  Weight: 56.2 kg 56.2 kg 56.2 kg    Examination:  General exam: Appears calm and comfortable thin and cachectic Respiratory system: Clear to auscultation. Respiratory effort normal. Cardiovascular system: S1 & S2 heard, RRR. No JVD, murmurs, rubs, gallops or clicks. No pedal edema.  Port-A-Cath present anterior chest wall Gastrointestinal system: Abdomen is nondistended, soft and nontender. No organomegaly or masses felt. Normal bowel sounds heard. Central nervous system:  Alert and oriented. No focal neurological deficits. Extremities: Symmetric 5 x 5 power.  Status post right BKA no bleedthrough on site Skin: No rashes, lesions or ulcers Psychiatry: Judgement and insight appear normal. Mood &  affect appropriate.     Data Reviewed: I have personally reviewed following labs and imaging studies  CBC: Recent Labs  Lab 11/23/18 1042  11/26/18 0249 11/27/18 0423 11/28/18 0418 11/29/18 0325 11/30/18 0436  WBC 12.4*   < > 10.2 8.0 8.8 9.8 8.3  NEUTROABS 10.5*  --   --   --   --   --   --   HGB 7.7*   < > 9.7* 9.7* 10.2* 10.2* 10.4*  HCT 24.4*   < > 30.4* 30.6* 32.2* 31.1* 32.6*  MCV 90.7   < > 89.7 90.5 90.2 90.1 90.1  PLT 246   < > 335 350 385 384 358   < > = values in this interval not displayed.   Basic Metabolic Panel: Recent Labs  Lab 11/26/18 0249  CREATININE 0.48*   GFR: Estimated Creatinine Clearance: 81 mL/min (A) (by C-G formula based on SCr of 0.48 mg/dL (L)). Liver Function Tests: No results for input(s): AST, ALT, ALKPHOS, BILITOT, PROT, ALBUMIN in the last 168 hours. No results for input(s): LIPASE, AMYLASE in the last 168 hours. No results for input(s): AMMONIA in the last 168 hours. Coagulation Profile: No results for input(s): INR, PROTIME in the last 168 hours. Cardiac Enzymes: No results for input(s): CKTOTAL, CKMB, CKMBINDEX, TROPONINI in the last 168 hours. BNP (last 3 results) No results for input(s): PROBNP in the last 8760 hours. HbA1C: No results for input(s): HGBA1C in the last 72 hours. CBG: Recent Labs  Lab 11/29/18 0831 11/29/18 1226 11/29/18 1618 11/29/18 2136 11/30/18 0727  GLUCAP 192* 110* 275* 179* 164*   Lipid Profile: No results for input(s): CHOL, HDL, LDLCALC, TRIG, CHOLHDL, LDLDIRECT in the last 72 hours. Thyroid Function Tests: No results for input(s): TSH, T4TOTAL, FREET4, T3FREE, THYROIDAB in the last 72 hours. Anemia Panel: No results for input(s): VITAMINB12, FOLATE, FERRITIN, TIBC, IRON,  RETICCTPCT in the last 72 hours. Sepsis Labs: No results for input(s): PROCALCITON, LATICACIDVEN in the last 168 hours.  No results found for this or any previous visit (from the past 240 hour(s)).       Radiology Studies: No results found.      Scheduled Meds: . acetaminophen  1,000 mg Oral Q8H  . apixaban  5 mg Oral BID  . docusate sodium  100 mg Oral Daily  . feeding supplement (ENSURE ENLIVE)  237 mL Oral TID BM  . insulin aspart  0-5 Units Subcutaneous QHS  . insulin aspart  0-9 Units Subcutaneous TID WC  . pantoprazole  40 mg Oral BID  . polyethylene glycol  17 g Oral Daily  . sodium chloride flush  10-40 mL Intracatheter Q12H  . sucralfate  1 g Oral TID WC & HS   Continuous Infusions: . magnesium sulfate 1 - 4 g bolus IVPB       LOS: 88 days    Time spent: 25 min    Nicolette Bang, MD [facility]  If 7PM-7AM, please contact night-coverage  11/30/2018, 10:48 AM

## 2018-11-30 NOTE — Progress Notes (Signed)
Occupational Therapy Treatment Patient Details Name: Ryan Medina MRN: [id number] DOB: [birth year] Today's Date: 11/30/2018    History of present illness Pt is a 58 y.o. male with DM, HTN, sCHF EF 45% and polysubstance abuse/homelessness. He presented initially with left leg pain on 12/13, found to have acute thromboembolic ischemic left leg.  He was taken emergently for embolectomy.  Post-op course complicated.  Of primary importance, found to have inoperable pancreatic cancer.  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    OT comments  Pt complete bathroom transfer this session with mod (A) LOB with transfer into that surface. Pt up in chair on arrival at the end of session. Pt able to demonstrate ordering dinner during session.   Follow Up Recommendations  SNF    Equipment Recommendations  3 in 1 bedside commode;Other (comment)(RW)    Recommendations for Other Services      Precautions / Restrictions Precautions Precautions: Fall Required Braces or Orthoses: Other Brace Other Brace: CAM boot (doesn't like to wear) Restrictions RLE Weight Bearing: Non weight bearing       Mobility Bed Mobility Overal bed mobility: Independent                Transfers Overall transfer level: Needs assistance Equipment used: Rolling walker (2 wheeled) Transfers: Sit to/from Stand Sit to Stand: Min guard         General transfer comment: cues for safety with hand placement    Balance Overall balance assessment: Needs assistance Sitting-balance support: Bilateral upper extremity supported(L LE on floor) Sitting balance-Leahy Scale: Good     Standing balance support: Bilateral upper extremity supported Standing balance-Leahy Scale: Poor Standing balance comment: reliant on support and leaning against sink                            ADL either performed or assessed with clinical judgement   ADL Overall ADL's : Needs  assistance/impaired     Grooming: Minimal assistance;Standing Grooming Details (indicate cue type and reason): requiers (A) for balance                 Toilet Transfer: Moderate assistance;RW Toilet Transfer Details (indicate cue type and reason): LOB x1 during bathroom transfer and not to just the bedside commode at EOB         Functional mobility during ADLs: Minimal assistance;Rolling walker General ADL Comments: Pt provided physical (A) for balance. pt with pain after transfer to bathroom and holding R UE in hip flexion and knee flexion     Vision       Perception     Praxis      Cognition Arousal/Alertness: Awake/alert Behavior During Therapy: WFL for tasks assessed/performed Overall Cognitive Status: History of cognitive impairments - at baseline                                 General Comments: fluctuating behavior at times, very pleasant on arrival and reports "i have to get my leg straight today"        Exercises     Shoulder Instructions       General Comments      Pertinent Vitals/ Pain       Pain Assessment: No/denies pain  Home Living  Prior Functioning/Environment              Frequency  Min 2X/week        Progress Toward Goals  OT Goals(current goals can now be found in the care plan section)  Progress towards OT goals: Progressing toward goals  Acute Rehab OT Goals Patient Stated Goal: get a prosthesis OT Goal Formulation: With patient Time For Goal Achievement: 12/04/18 Potential to Achieve Goals: Good ADL Goals Pt Will Perform Grooming: with supervision;with set-up;standing Pt Will Perform Lower Body Dressing: with set-up;with supervision;sit to/from stand Pt Will Transfer to Toilet: with supervision;ambulating;bedside commode Pt Will Perform Toileting - Clothing Manipulation and hygiene: with supervision;sit to/from stand Pt/caregiver will  Perform Home Exercise Program: Increased strength;Both right and left upper extremity;With written HEP provided Additional ADL Goal #1: pt will perform ADL functional transfers with modified independence.  Plan Discharge plan needs to be updated    Co-evaluation                 AM-PAC OT "6 Clicks" Daily Activity     Outcome Measure   Help from another person eating meals?: None Help from another person taking care of personal grooming?: None Help from another person toileting, which includes using toliet, bedpan, or urinal?: None Help from another person bathing (including washing, rinsing, drying)?: A Little Help from another person to put on and taking off regular upper body clothing?: None Help from another person to put on and taking off regular lower body clothing?: None 6 Click Score: 23    End of Session Equipment Utilized During Treatment: Rolling walker;Gait belt  OT Visit Diagnosis: Unsteadiness on feet (R26.81);Muscle weakness (generalized) (M62.81) Pain - Right/Left: Right Pain - part of body: Leg   Activity Tolerance Patient tolerated treatment well   Patient Left in chair;with call bell/phone within reach;with chair alarm set   Nurse Communication Mobility status;Precautions        Time: 4287-6811 OT Time Calculation (min): 15 min  Charges: OT General Charges $OT Visit: 1 Visit OT Treatments $Self Care/Home Management : 8-22 mins   Jeri Modena, OTR/L  Acute Rehabilitation Services Pager: [phone] Office: [phone] .    Jeri Modena 11/30/2018, 4:55 PM

## 2018-11-30 NOTE — Progress Notes (Addendum)
Nutrition Follow-up  DOCUMENTATION CODES:   Severe malnutrition in context of chronic illness, Underweight  INTERVENTION:   Continue:  Offer Ensure Enlive po TID, each supplement provides 350 kcal and 20 grams of protein  Magic cup TID with meals, each supplement provides 290 kcal and 9 grams of protein  NUTRITION DIAGNOSIS:   Severe Malnutrition related to chronic illness(Pancreatic cancer, DM and CHF) as evidenced by severe muscle depletion, severe fat depletion, percent weight loss.  Ongoing   GOAL:   Patient will meet greater than or equal to 90% of their needs  Met  MONITOR:   PO intake, Supplement acceptance, Skin  ASSESSMENT:   58 y.o male with PMH: recent dx pancreatic cancer, DM, CHF, crack cocaine use, emboli of legs, smoker. Pt admitted with DKA and GI bleed. 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 Received TPN from 2/14 to 2/23.  Patient is awaiting SNF placement.   BKA is healing well. Patient reports ongoing good appetite; drinking Ensure Enlive between meals and receiving magic cup with meals. He is consuming 75-100% of all meals.   Labs reviewed. CBG's: 164-136 today Medications reviewed.   Diet Order:   Diet Order            Diet Heart Room service appropriate? Yes; Fluid consistency: Thin  Diet effective now              EDUCATION NEEDS:   Education needs have been addressed  Skin: R leg BKA  Last BM:  3/9  Height:   Ht Readings from Last 1 Encounters:  11/12/18 _0  (1.803 m)    Weight:   Wt Readings from Last 1 Encounters:  11/12/18 56.2 kg    Ideal Body Weight:  73.1 kg  BMI:  18.5  Estimated Nutritional Needs:   Kcal:  2000-2200 kcal  Protein:  100-110 grams  Fluid:  >/= 2L/day    Molli Barrows, RD, LDN, [place] Pager [phone] After Hours Pager [phone]

## 2018-11-30 NOTE — Progress Notes (Signed)
Physical Therapy Treatment Patient Details Name: Ryan Medina MRN: [id number] DOB: Mar 25, 1961 Today's Date: 11/30/2018    History of Present Illness Pt is a 58 y.o. male with DM, HTN, sCHF EF 45% and polysubstance abuse/homelessness. He presented initially with left leg pain on 12/13, found to have acute thromboembolic ischemic left leg.  He was taken emergently for embolectomy.  Post-op course complicated.  Of primary importance, found to have inoperable pancreatic cancer.  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     PT Comments    Patient seen for mobility progression. Pt tolerated gait training distance of 50 ft and LE therex. Positioning reviewed with pt to promote optimal R knee ROM. Continue to progress as tolerated with anticipated d/c to SNF for further skilled PT services.     Follow Up Recommendations  SNF     Equipment Recommendations  Other (comment)(defer to next venue)    Recommendations for Other Services       Precautions / Restrictions Precautions Precautions: Fall Precaution Comments: Pt reports fall overnight 2/28 trying to use urinal. Restrictions Weight Bearing Restrictions: Yes RLE Weight Bearing: Non weight bearing    Mobility  Bed Mobility               General bed mobility comments: pt OOB in chair upon arrival  Transfers Overall transfer level: Needs assistance Equipment used: Rolling walker (2 wheeled) Transfers: Sit to/from Stand Sit to Stand: Min guard         General transfer comment: safe hand placement demonstrated; min guard for safety  Ambulation/Gait Ambulation/Gait assistance: Min guard Gait Distance (Feet): 50 Feet Assistive device: Rolling walker (2 wheeled) Gait Pattern/deviations: Step-to pattern     General Gait Details: cues for hip extension and trying to maintain R knee extension; pt tends to keep R knee flexed while ambulating likely due to increased pain with  dependent position   Stairs             Wheelchair Mobility    Modified Rankin (Stroke Patients Only)       Balance Overall balance assessment: Needs assistance Sitting-balance support: No upper extremity supported;Feet supported Sitting balance-Leahy Scale: Good     Standing balance support: Bilateral upper extremity supported;During functional activity Standing balance-Leahy Scale: Poor Standing balance comment: reliant on RW if more than a transfer                            Cognition Arousal/Alertness: Awake/alert Behavior During Therapy: WFL for tasks assessed/performed Overall Cognitive Status: Within Functional Limits for tasks assessed                                        Exercises Amputee Exercises Quad Sets: AROM;Right;15 reps Hip Extension: AROM;Right;10 reps;Standing Hip Flexion/Marching: AROM;Right;15 reps Straight Leg Raises: AROM;Right;10 reps    General Comments        Pertinent Vitals/Pain Pain Assessment: Faces Faces Pain Scale: Hurts even more Pain Location: R residual limb at times  Pain Descriptors / Indicators: Grimacing;Guarding;Sharp Pain Intervention(s): Limited activity within patient's tolerance;Monitored during session;Repositioned;Premedicated before session    Home Living                      Prior Function            PT Goals (current goals  can now be found in the care plan section) Acute Rehab PT Goals Patient Stated Goal: get a prosthesis Progress towards PT goals: Progressing toward goals    Frequency    Min 3X/week      PT Plan Current plan remains appropriate    Co-evaluation PT/OT/SLP Co-Evaluation/Treatment: Yes            AM-PAC PT "6 Clicks" Mobility   Outcome Measure  Help needed turning from your back to your side while in a flat bed without using bedrails?: None Help needed moving from lying on your back to sitting on the side of a flat bed without  using bedrails?: None Help needed moving to and from a bed to a chair (including a wheelchair)?: A Little Help needed standing up from a chair using your arms (e.g., wheelchair or bedside chair)?: A Little Help needed to walk in hospital room?: A Little Help needed climbing 3-5 steps with a railing? : A Lot 6 Click Score: 19    End of Session Equipment Utilized During Treatment: Gait belt Activity Tolerance: Patient tolerated treatment well Patient left: with call bell/phone within reach;in chair Nurse Communication: Mobility status PT Visit Diagnosis: Other abnormalities of gait and mobility (R26.89);Difficulty in walking, not elsewhere classified (R26.2);Pain;Muscle weakness (generalized) (M62.81) Pain - Right/Left: Right Pain - part of body: Leg     Time: 1120-1145 PT Time Calculation (min) (ACUTE ONLY): 25 min  Charges:  $Gait Training: 8-22 mins $Therapeutic Exercise: 8-22 mins                     Earney Navy, PTA Acute Rehabilitation Services Pager: [phone] Office: [phone]     Darliss Cheney 11/30/2018, 11:55 AM

## 2018-11-30 NOTE — Progress Notes (Signed)
Interim Hospital Course:  [name] is a 58 y.o. M with DM, HTN, sCHF EF 45% and polysubstance abuse/homelessness who presented initially with right leg pain on 12/13, found to have acute thromboembolic ischemic right leg.  Taken emergently for embolectomy. He remains in the hospital 2/2 to difficult placement.  Hospital day # 79   Major subsequent hospital events: 1. Right popliteal and tibial embolectomy at admission 12/13  2. Decompensation, intubation for duodenal hemorrhage on 12/17 with IR coiling  3. 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 Diagnosis of inoperable locally advanced pancreatic cancer incidentally on 12/21  5. Transferred to [facility] for XRT from 1/9-1/22; Port placed 1/21 but chemotherapy pending clinical stabilization  6. PE diagnosed 2/5 and restarted on anticoagulation  7. 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   Further timeline: 12/13 - Right popliteal and tibial embolectomy due to clinically ischemic right lower extremitiy  12/15 - Evacuation of hematoma and fasciotomy of right leg due to bleeding from right leg wound  12/17 - intubated, central line placed 12/17 - EGD showed actively bleeding duodenal ulcer 12/17 - IR performed mesenteric angiogram and embolization of bleeding gastroduodenal ulcer   12/20 - Repeat right popliteal and tibial embolectomy  12/21 - CTA abdomen shows pancreatic mass incidentally as well as mural thrombus in the dilated bilateral iliac arteries  12/26 - EUS for biopsy of pancreatic mass 12/26 - TEE normal EF, no valvular vegetations  1/9 - 1/22 - Treated with palliative XRT (primarily to stop GI bleeding) 1/21 - Port placed  2/5 - CT chest showed bilateral PE, given recurrent VTE, was restarted on anticoagulation with heparin gtt  2/7 - EGD repeat for follow up ulcer showed malignant pyloric stenosis, candidiasis  2/13 - Open gastrojejunostomy was  performed due to permanent malignant pyloric stenosis and patient diet advanced  2/21 - EGD repeat for hematochezia was performed, no source of bleeding noted  2/28 - Right BKA performed due to right LE gangrene   12/17 EGD report Findings: No gross lesions were noted in the proximal esophagus. LA Grade D (one or more mucosal breaks involving at least 75% of esophageal circumference) esophagitis with bleeding was found in the middle and distal esophagus. This is severe and concerning for black esophagus which can be found in patients after severe hypotension. Clotted blood was found in the entire examined stomach. Lavage of the area was performed using copious amounts, resulting in incomplete clearance with fair visualization. A J-shaped deformity was found in the gastric body. There is no endoscopic evidence of ulceration in the entire examined stomach. One partially obstructing (about 60% obstructed) oozing cratered duodenal ulcer with an adherent clot (Forrest Class IIb) was found in the duodenal bulb. The lesion was at least 50 mm in largest dimension.  Impression: - No gross lesions in proximal esophagus. LA Grade  D esophagitis in middle and distal esophagus.. - Clotted blood in the entire stomach. J-shaped  deformity in the gastric body. - One oozing duodenal ulcer with an adherent clot  (Forrest Class IIb). There is no evidence of  perforation. Attempts at removal of the clot were  not successful. Without family around, and his  clinical stability, decision made to not remove the  clot because of concern for significant hemodynamic  compromise. Hemospray was applied in effort to   decrease the risk of persistent bleeding while  awaiting next steps in evaluation/therapy as  outlined below. - No gross lesions  in the second portion of the  duodenum. Recommendation: - The patient will be observed post-procedure,  until all discharge criteria are met. - Return care to ICU. - Discussed case with ICU and with Interventional  Radiology. Overall, high concern for risk of  rebleeding, in setting of active oozing after just  movement of small amount of clot at the base - thus  consistent with still active bleeding. This is too  large of an area for other endoscopic therapies  other than use of Hemospray which was used today.  Next step in evaluation should be a mesenteric  angiogram and likely GDA embolization to decrease  risk as much as possible of severe hemorrhage again. - Do not place NGT for the next few days due to  severe esophagitis. - Continue IV PPI drip. - Send H. pylori serology and treat if positive. - The findings and recommendations were discussed  with the referring physician.   12/26 TEE EF 50-55% mil LVE Mild MR Normal AV No LAA thrombus Negative bubble study No SOE Normal RV  Mild TR No aortic debris  12/26 EUS Findings: 1. There was a large amount of old blood in the stomach. After noticing this, anesthesia team elected to protect his  airway with ET intubation. 2. The previously noted duodenal bulb ulcer was again located. It was still deeply cratered. The surrounding mucosa was quite friable and intermittently oozing. The mucosa was firm and concerning for malignancy and so I sampled it with forceps. 3. Reflux related esophagitis EUS findings: 1. Following EGD above, I proceeded wtih EUS evaluation. There was a large hypoechoic, heterogeneous mass involving the neck of the pancreas. This measured 3.6cm by 2.1cm. The mass causes main pancreatic duct obstruction and resultant dilation (to 78m). The mass encases the celiac trunk and abuts the SMA as well. I sampled the mass with three transgastric passes using a 25 gauge EUS FNA needle.  - 3.6cm by 2.1cm mass in the neck of pancreas that surrounds the celiac trunk, obstructs the main pancreatic duct and appears to have also invaded the duodenal bulb created a large, malignant, friable ulcer. I biopsied the abnormal, firm mucosa surrounding the duodenal ulcer and sampled the pancreatic mass with transgastric EUS FNA. Preliminary cytology review from the pancreatic mass was positive for malignancy.  2/7 EGD report Findings: LA Grade C (one or more mucosal breaks continuous between tops of 2 or more mucosal folds, less than 75% circumference) esophagitis with no bleeding was found in the distal esophagus.  Esophagitis with no bleeding was found in the upper third of the esophagus. A large amount of food (residue) and liquid was found in the gastric fundus and in the gastric body. Not much could be suctioned out becayse scope suction kept getting clogged with debris. A malignant-appearing, intrinsic severe stenosis was found at the pylorus.It was not biopsied, as it is already known to be malignant. This was non-traversed, and there was barely any visible lumen. The scope was also looping due to gastric dilatation. An examination of the duodenum was not  performed.   2/21 EGD report Findings: The examined esophagus was normal. No esophageal varices. Evidence of a gastrojejunostomy was found in the gastric body. This was characterized by ulceration at the anastomosis. This area is difficult to examine given anatomy/location. There is a visible stable at the ulceration but no visible vessel was seen. There was no evidence of active or recent bleeding. A known malignant-appearing, intrinsic severe stenosis was found at  the pylorus. This was not traversed. An examination of the duodenum was not performed. - Normal esophagus. No varices. - Small hiatal hernia. 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 - No specimens collected.

## 2018-12-01 LAB — GLUCOSE, CAPILLARY
Glucose-Capillary: 250 mg/dL — ABNORMAL HIGH (ref 70–99)
Glucose-Capillary: 172 mg/dL — ABNORMAL HIGH (ref 70–99)
Glucose-Capillary: 151 mg/dL — ABNORMAL HIGH (ref 70–99)
Glucose-Capillary: 297 mg/dL — ABNORMAL HIGH (ref 70–99)

## 2018-12-01 LAB — CBC
HCT: 31.5 % — ABNORMAL LOW (ref 39.0–52.0)
Hemoglobin: 10 g/dL — ABNORMAL LOW (ref 13.0–17.0)
MCH: 28.7 pg (ref 26.0–34.0)
MCHC: 31.7 g/dL (ref 30.0–36.0)
MCV: 90.3 fL (ref 80.0–100.0)
Platelets: 344 10*3/uL (ref 150–400)
RBC: 3.49 MIL/uL — ABNORMAL LOW (ref 4.22–5.81)
RDW: 14.4 % (ref 11.5–15.5)
WBC: 8.5 10*3/uL (ref 4.0–10.5)
nRBC: 0 % (ref 0.0–0.2)

## 2018-12-01 MED ORDER — INSULIN DETEMIR 100 UNIT/ML ~~LOC~~ SOLN
5.0000 [IU] | Freq: Two times a day (BID) | SUBCUTANEOUS | Status: DC
  Administered 2018-12-01 – 2018-12-07 (×9): 5 [IU] via SUBCUTANEOUS
  Filled 2018-12-01 (×13): qty 0.05

## 2018-12-01 MED ORDER — APIXABAN (ELIQUIS) EDUCATION KIT FOR DVT/PE PATIENTS
PACK | Freq: Once | Status: AC
  Administered 2018-12-01: 19:00:00

## 2018-12-01 NOTE — Progress Notes (Addendum)
[facility] PROGRESS NOTE    Progress Note  Ryan Medina  ONG:[id number] DOB: 1961/03/20 DOA: 09/03/2018 PCP: Patient, No Pcp Per     Brief Narrative:   Ryan Medina is an 58 y.o. male past medical history of diabetes mellitus, hypertension, systolic heart failure with an EF of 45%, polysubstance abuse who presented on 09/03/2018 with right leg pain was found to have an acute thromboembolic ischemic event taken emergent leading to embolectomy  Major Hospital events: - 09/03/2018 Right popliteal and tibial embolectomy due to ischemic right lower extremity. - 09/05/2018 Evacuation of hematoma and fasciotomy of the right leg due to bleeding from right leg wound. - 09/07/2018 EGD showing active bleeding ulcer. - 09/07/2018 Decompensated and intubated due to a duodenal ulcer with hemorrhage IR performed coiling. -  09/10/2018 Recurrent right lower extremity embolism requiring repeated leg embolectomy. -09/11/2018 CTA showed pancreatic mass with mural thrombus and dilated bilateral iliac arteries. - EUS biopsy of pancreatic cancer 09/16/2018. -  09/12/2019 Diagnosed with locally advanced pancreatic cancer incidentally. -Transferred to [facility] for radiation from 09/30/2020 10/13/2018 Port-A-Cath placed on 10/12/2018 for chemotherapy (has not been started pending stabilization) - PE on 08/27/2019 anticoagulation restarted. - 10/29/2018 repeated EGD for follow-up ulcer showed malignant pyloric stenosis and candidiasis - 11/04/2018 open gastrojejunostomy was performed due to malignant pyloric stenosis. -09/12/2019 EGD repeated for hematochezia, no source of bleeding. 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 Patient needs oxycodone for pain.  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 Tolerating diet without any complications.  Acute blood loss anemia on anemia of chronic disease/hematochezia/malignant duodenal ulcer: Really on a PPI and sucralfate, he has received several doses of Feraheme, currently on oral iron. No evidence of bleeding hemoglobin is stable 10.4.  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  Candida esophagitis: He has completed treatment.  Acute Pulmonary embolism: The case was discussed with hematology oncology and the patient and patient refused Lovenox, the patient opted for Eliquis which is super optimal alternative.  Chronic systolic and diastolic heart failure: Stable no signs of overload.  Sepsis from healthcare associated pneumonia/Pseudomonas UTI: He has completed treatment with IV cefepime.  DM (diabetes mellitus), type 2, uncontrolled ([place]) Start long-acting insulin plus sliding scale insulin.  Disposition: PT OT evaluated the patient and recommended skilled nursing facility for his BKA as well as his malnutrition. The patient has been in the hospital more than 3 days. Needs rehabilitation at least 5 days a week to facilitate daily activities and transition to home. Rehab is being requested as the most appropriate discharge option for the patient is not felt to be custodial care as evidenced by the patient's inability to perform ADLs and ambulate.    RN Pressure Injury Documentation:    Estimated body mass index is 17.28 kg/m as calculated from the following:   Height as of this encounter: 5\' 11"  (1.803 m).   Weight as of this encounter: 56.2 kg. Malnutrition Type:  Nutrition Problem: Severe Malnutrition Etiology: chronic illness(Pancreatic cancer, DM and CHF)   Malnutrition  Characteristics:  Signs/Symptoms: severe muscle depletion, severe fat depletion, percent weight loss Percent weight loss: 23 %  Nutrition Interventions:  Interventions: Ensure Enlive (each supplement provides 350kcal and 20 grams of protein), Magic cup    DVT prophylaxis: Eliquis Family Communication:none Disposition Plan/Barrier to D/C: SNF when bed available Code Status:     Code Status Orders  (From admission, onward)         Start     Ordered   09/07/18 1850  Limited resuscitation (code)  Continuous    Question Answer Comment  In the event of cardiac or respiratory ARREST: Initiate Code Blue, Call Rapid Response No   In the event of cardiac or respiratory ARREST: Perform CPR No   In the event of cardiac or respiratory ARREST: Perform Intubation/Mechanical Ventilation Yes   In the event of cardiac or respiratory ARREST: Use NIPPV/BiPAp only if indicated No   In the event of cardiac or respiratory ARREST: Administer ACLS medications if indicated No   In the event of cardiac or respiratory ARREST: Perform Defibrillation or Cardioversion if indicated No   Comments Continue current care with mechanical ventilation and vasopressor support.      09/07/18 1850        Code Status History    Date Active Date Inactive Code Status Order ID Comments User Context   09/03/2018 2251 09/07/2018 1850 Full Code [id number]  [name], MD Inpatient   09/03/2018 2236 09/03/2018 2251 Full Code [id number]  Iline Oven Inpatient   08/22/2018 2203 08/25/2018 1655 Full Code [id number]  Ivor Costa, MD ED   01/30/2018 0308 01/31/2018 1807 Full Code [id number]  [name], MD ED   05/19/2016 1029 05/19/2016 1721 Full Code [id number]  Leonie Man, MD Inpatient        IV Access:    Peripheral IV   Procedures and diagnostic studies:   No results found.   Medical Consultants:    None.  Anti-Infectives:   None  Subjective:    Ryan Medina he  relates no new complaint is tolerating his diet his pain is well controlled.  Objective:    Vitals:   11/30/18 0521 11/30/18 0543 11/30/18 2028 12/01/18 0416  BP: (!) 125/106 (!) 129/96 113/85 (!) 112/92  Pulse: 89 95 96 81  Resp: 20  15 15   Temp:   98.2 F (36.8 C) 98.1 F (36.7 C)  TempSrc:   Oral Oral  SpO2: 100%  100% 100%  Weight:      Height:        Intake/Output Summary (Last 24 hours) at 12/01/2018 0720 Last data filed at 12/01/2018 0656 Gross per 24 hour  Intake 970 ml  Output 700 ml  Net 270 ml   Filed Weights   11/02/18 1806 11/04/18 0831 11/12/18 0742  Weight: 56.2 kg 56.2 kg 56.2 kg    Exam: General exam: In no acute distress. Respiratory system: Good air movement and clear to auscultation. Cardiovascular system: S1 & S2 heard, RRR.  Gastrointestinal system: Abdomen is nondistended, soft and nontender.  Central nervous system: Alert and oriented. No focal neurological deficits. 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 Skin: No rashes, lesions or ulcers Psychiatry: Judgement and insight appear normal. Mood & affect appropriate.    Data Reviewed:    Labs: Basic Metabolic Panel: Recent Labs  Lab 11/26/18 0249  CREATININE 0.48*   GFR Estimated Creatinine Clearance: 81 mL/min (A) (by C-G formula based on SCr of 0.48 mg/dL (L)). Liver Function Tests: No results for input(s): AST, ALT, ALKPHOS, BILITOT, PROT, ALBUMIN in the last 168 hours. No  results for input(s): LIPASE, AMYLASE in the last 168 hours. No results for input(s): AMMONIA in the last 168 hours. Coagulation profile No results for input(s): INR, PROTIME in the last 168 hours.  CBC: Recent Labs  Lab 11/27/18 0423 11/28/18 0418 11/29/18 0325 11/30/18 0436 12/01/18 0419  WBC 8.0 8.8 9.8 8.3 8.5  HGB 9.7* 10.2* 10.2* 10.4* 10.0*  HCT 30.6* 32.2* 31.1* 32.6* 31.5*  MCV 90.5 90.2 90.1 90.1 90.3  PLT 350 385 384 358 344   Cardiac Enzymes: No results for input(s): CKTOTAL,  CKMB, CKMBINDEX, TROPONINI in the last 168 hours. BNP (last 3 results) No results for input(s): PROBNP in the last 8760 hours. CBG: Recent Labs  Lab 11/29/18 2136 11/30/18 0727 11/30/18 1127 11/30/18 1645 11/30/18 2206  GLUCAP 179* 164* 136* 169* 240*   D-Dimer: No results for input(s): DDIMER in the last 72 hours. Hgb A1c: No results for input(s): HGBA1C in the last 72 hours. Lipid Profile: No results for input(s): CHOL, HDL, LDLCALC, TRIG, CHOLHDL, LDLDIRECT in the last 72 hours. Thyroid function studies: No results for input(s): TSH, T4TOTAL, T3FREE, THYROIDAB in the last 72 hours.  Invalid input(s): FREET3 Anemia work up: No results for input(s): VITAMINB12, FOLATE, FERRITIN, TIBC, IRON, RETICCTPCT in the last 72 hours. Sepsis Labs: Recent Labs  Lab 11/28/18 0418 11/29/18 0325 11/30/18 0436 12/01/18 0419  WBC 8.8 9.8 8.3 8.5   Microbiology No results found for this or any previous visit (from the past 240 hour(s)).   Medications:   . acetaminophen  1,000 mg Oral Q8H  . apixaban  5 mg Oral BID  . docusate sodium  100 mg Oral Daily  . feeding supplement (ENSURE ENLIVE)  237 mL Oral TID BM  . insulin aspart  0-5 Units Subcutaneous QHS  . insulin aspart  0-9 Units Subcutaneous TID WC  . pantoprazole  40 mg Oral BID  . polyethylene glycol  17 g Oral Daily  . sodium chloride flush  10-40 mL Intracatheter Q12H  . sucralfate  1 g Oral TID WC & HS   Continuous Infusions: . magnesium sulfate 1 - 4 g bolus IVPB         LOS: 89 days   Charlynne Cousins  [facility]  12/01/2018, 7:20 AM

## 2018-12-01 NOTE — Progress Notes (Signed)
So far, there is been no obvious progress on Ryan Medina discharge.  He is getting physical therapy.  He is getting occupational therapy.  His CT scan that was done on Friday showed minimally enlarged pancreatic mass.  He has some mildly increased external iliac lymph nodes which could be reactive.  There is no obvious bleeding.  His hemoglobin is 10 this morning.  White cell count 8.5.  Platelet count 344,000.  He is still on Eliquis.  He is eating.  There is no nausea or vomiting.  He is not having any obvious melena.  On physical exam, vital signs are temperature 98.1.  Pulse 81.  Blood pressure 112/92.  His oral exam shows no thrush.  His lungs are clear bilaterally.  Cardiac exam regular rate and rhythm.  He has no murmurs, rubs or bruits.  Abdomen is soft.  His laparotomy scar is well-healed.  There is no fluid wave.  There is no palpable liver or spleen tip.  Extremities shows the right BKA.  Neurological exam is nonfocal.  Ryan Medina has a locally advanced pancreatic cancer.  We still not be able to start any type of systemic therapy on him.  I think the real question is when we could start if that is possible.  I am not sure what his outpatient situations can be.  It does not sound like anyone knows where he will be living.  I would continue him on the anticoagulant with Eliquis.  I would check his electrolytes tomorrow.   [name], MD  [address] [place] 4:10

## 2018-12-01 NOTE — Progress Notes (Signed)
Pt with sister Lavella Lemons visiting early in the shift. Pt with many requests for snacks throughout the night. Pt a little emotional, brother in ED for a possible spider bite on his hand. R BKA dressing changed per MD orders. Noted stump with staples intact scant amount of blood noted. L side(inner area of stump) with tunneling. Removal of packing noted gauze dry and dark blood stained and sticking to area. Moisten area with sterile saline to loosen and remove gauze. Redressed R BKA per MD instructions. Pt tolerated fair. Call light and phone in reach. Bed exit alarm maintained. Rounds continued per MD orders and unit protocol.

## 2018-12-01 NOTE — Progress Notes (Addendum)
CSW received second confirmed denial from [facility] from their Corporate office and with assistance from [facility].   CSW working with [employer] [employer] for possible bed offer.   No SNF bed offers at this time. Patient on difficult to place list due to Medicaid with no discharge plan after SNF (homeless).  [place], [place]

## 2018-12-02 LAB — COMPREHENSIVE METABOLIC PANEL
ALK PHOS: 117 U/L (ref 38–126)
ALT: 60 U/L — ABNORMAL HIGH (ref 0–44)
AST: 42 U/L — ABNORMAL HIGH (ref 15–41)
Albumin: 2 g/dL — ABNORMAL LOW (ref 3.5–5.0)
Anion gap: 7 (ref 5–15)
BUN: 11 mg/dL (ref 6–20)
CO2: 27 mmol/L (ref 22–32)
Calcium: 8.5 mg/dL — ABNORMAL LOW (ref 8.9–10.3)
Chloride: 102 mmol/L (ref 98–111)
Creatinine, Ser: 0.52 mg/dL — ABNORMAL LOW (ref 0.61–1.24)
GFR calc Af Amer: >60 mL/min (ref >60)
GFR calc non Af Amer: >60 mL/min (ref >60)
Glucose, Bld: 207 mg/dL — ABNORMAL HIGH (ref 70–99)
Potassium: 3.4 mmol/L — ABNORMAL LOW (ref 3.5–5.1)
Sodium: 136 mmol/L (ref 135–145)
Total Bilirubin: 0.4 mg/dL (ref 0.3–1.2)
Total Protein: 6.6 g/dL (ref 6.5–8.1)

## 2018-12-02 LAB — CBC
HEMATOCRIT: 34.7 % — AB (ref 39.0–52.0)
Hemoglobin: 10.9 g/dL — ABNORMAL LOW (ref 13.0–17.0)
MCH: 28.5 pg (ref 26.0–34.0)
MCHC: 31.4 g/dL (ref 30.0–36.0)
MCV: 90.6 fL (ref 80.0–100.0)
Platelets: 362 10*3/uL (ref 150–400)
RBC: 3.83 MIL/uL — ABNORMAL LOW (ref 4.22–5.81)
RDW: 14.4 % (ref 11.5–15.5)
WBC: 10.7 10*3/uL — ABNORMAL HIGH (ref 4.0–10.5)
nRBC: 0 % (ref 0.0–0.2)

## 2018-12-02 LAB — GLUCOSE, CAPILLARY
Glucose-Capillary: 126 mg/dL — ABNORMAL HIGH (ref 70–99)
Glucose-Capillary: 143 mg/dL — ABNORMAL HIGH (ref 70–99)
Glucose-Capillary: 167 mg/dL — ABNORMAL HIGH (ref 70–99)
Glucose-Capillary: 177 mg/dL — ABNORMAL HIGH (ref 70–99)

## 2018-12-02 NOTE — Care Management (Signed)
Extreme risk for readmission assessment completed.     Ricki Miller, RN BSN  Case Manager [phone]

## 2018-12-02 NOTE — Progress Notes (Signed)
12/02/18 1125  PT Visit Information  Last PT Received On 12/02/18  Assistance Needed +1  History of Present Illness Pt is a 58 y.o. male with DM, HTN, sCHF EF 45% and polysubstance abuse/homelessness. He presented initially with left leg pain on 12/13, found to have acute thromboembolic ischemic left leg.  He was taken emergently for embolectomy.  Post-op course complicated.  Of primary importance, found to have inoperable pancreatic cancer.  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   Subjective Data  Patient Stated Goal "I want a wheelchair"  Precautions  Precautions Fall  Precaution Comments Pt reports fall overnight 2/28 trying to use urinal.  Restrictions  Weight Bearing Restrictions Yes  RLE Weight Bearing NWB  Pain Assessment  Pain Assessment 0-10  Pain Score 6  Pain Location R residual limb  Pain Descriptors / Indicators Sore;Operative site guarding  Pain Intervention(s) Limited activity within patient's tolerance;Monitored during session  Cognition  Arousal/Alertness Awake/alert  Behavior During Therapy WFL for tasks assessed/performed  Overall Cognitive Status History of cognitive impairments - at baseline  Area of Impairment Safety/judgement  Safety/Judgement Decreased awareness of safety;Decreased awareness of deficits  Bed Mobility  Overal bed mobility Modified Independent  Bed Mobility Supine to Sit  Supine to sit Modified independent (Device/Increase time)  General bed mobility comments Patient at modI level for bed mobility  Transfers  Overall transfer level Needs assistance  Transfers Sit to/from Stand  Sit to Stand Min guard  General transfer comment Patient required min guard to stand with use of RW. Patient demonstrated appropriate hand placement prior to standing.  Ambulation/Gait  Ambulation/Gait assistance Min guard  Gait Distance (Feet) 15 Feet  Assistive device Rolling walker (2 wheeled)  Gait  Pattern/deviations Step-to pattern  General Gait Details Patient ambulated short distance in room with min guard and use of RW for safety. Verbal cues for knee extension during gait as patient prefers to hold RLE in knee flexion likely due to pain. Patient refusing further mobility secondary to fatigue.   Gait velocity decreased  Gait velocity interpretation <1.8 ft/sec, indicate of risk for recurrent falls  Balance  Overall balance assessment Needs assistance  Sitting-balance support Feet supported;No upper extremity supported  Sitting balance-Leahy Scale Good  Standing balance support Bilateral upper extremity supported  Standing balance-Leahy Scale Poor  Standing balance comment reliant on BUE support and RW to maintain standing balance  General Comments  General comments (skin integrity, edema, etc.) Educated patient about importance of achieving full knee extension of RLE. Verbal cues required for proper technique during HEP.   Exercises  Exercises Amputee  General Exercises - Lower Extremity  Quad Sets AROM;Right;10 reps;Seated  Long Arc Quad AROM;Right;10 reps;Seated  Straight Leg Raises AROM;Right;10 reps;Seated  Amputee Exercises  Hip Extension AROM;Right;10 reps;Standing  PT - End of Session  Equipment Utilized During Treatment Gait belt  Activity Tolerance Patient tolerated treatment well  Patient left in chair;with call bell/phone within reach;with chair alarm set  Nurse Communication Mobility status   PT - Assessment/Plan  PT Plan Current plan remains appropriate  PT Visit Diagnosis Other abnormalities of gait and mobility (R26.89);Difficulty in walking, not elsewhere classified (R26.2);Pain;Muscle weakness (generalized) (M62.81)  Pain - Right/Left Right  Pain - part of body Leg  PT Frequency (ACUTE ONLY) Min 3X/week  Follow Up Recommendations SNF  PT equipment Other (comment) (TBD at next venue)  AM-PAC PT "6 Clicks" Mobility Outcome Measure (Version 2)  Help needed  turning from your back  to your side while in a flat bed without using bedrails? 4  Help needed moving from lying on your back to sitting on the side of a flat bed without using bedrails? 4  Help needed moving to and from a bed to a chair (including a wheelchair)? 3  Help needed standing up from a chair using your arms (e.g., wheelchair or bedside chair)? 3  Help needed to walk in hospital room? 3  Help needed climbing 3-5 steps with a railing?  2  6 Click Score 19  Consider Recommendation of Discharge To: Home with [facility]  PT Goal Progression  Progress towards PT goals Progressing toward goals  Acute Rehab PT Goals  PT Goal Formulation With patient  Time For Goal Achievement 12/18/18  Potential to Achieve Goals Good  PT Time Calculation  PT Start Time (ACUTE ONLY) 1103  PT Stop Time (ACUTE ONLY) 1119  PT Time Calculation (min) (ACUTE ONLY) 16 min  PT General Charges  $$ ACUTE PT VISIT 1 Visit  PT Treatments  $Therapeutic Exercise 8-22 mins   Patient progressing towards goals. Educated and reviewed HEP for RLE strengthening and importance of achieving full knee extension of RLE. Patient ambulated short distance in room with min guard and RW. Patient refusing further mobility secondary to fatigue. Current plans remains appropriate and goals have been updated. Patient will continue to benefit from acute physical therapy to maximize independence and safety with functional mobility.   Erick Blinks, SPT

## 2018-12-02 NOTE — Progress Notes (Signed)
[facility] PROGRESS NOTE    Progress Note  Ryan Medina  JKK:[id number] DOB: [birth year] DOA: 09/03/2018 PCP: Patient, No Pcp Per     Brief Narrative:   Ryan Medina is an 58 y.o. male past medical history of diabetes mellitus, hypertension, systolic heart failure with an EF of 45%, polysubstance abuse who presented on 09/03/2018 with right leg pain was found to have an acute thromboembolic ischemic event taken emergent leading to embolectomy Awaiting placement.  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 Patient needs oxycodone for pain. Patient is high risk readmission awaiting skilled nursing facility placement.  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 Tolerating diet without any complications.  Acute blood loss anemia on anemia of chronic disease/hematochezia/malignant duodenal ulcer: Really on a PPI and sucralfate, he has received several doses of Feraheme, currently on oral iron. No evidence of bleeding hemoglobin is stable 10.4.  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  Candida esophagitis: He has completed treatment.  Acute Pulmonary embolism: The case was discussed with hematology oncology and the patient and patient refused Lovenox, the patient opted for Eliquis which is super optimal alternative.  Chronic systolic and diastolic heart failure: Stable no signs of overload.  Sepsis from healthcare associated pneumonia/Pseudomonas UTI: He has completed treatment with IV cefepime.  DM (diabetes mellitus), type 2, uncontrolled ([place]) Start long-acting insulin plus sliding  scale insulin.  Disposition: PT OT evaluated the patient and recommended skilled nursing facility for his BKA as well as his malnutrition. The patient has been in the hospital more than 3 days. Needs rehabilitation at least 5 days a week to facilitate daily activities and transition to home. Rehab is being requested as the most appropriate discharge option for the patient is not felt to be custodial care as evidenced by the patient's inability to perform ADLs and ambulate.  DVT prophylaxis: Eliquis Family Communication:none Disposition Plan/Barrier to D/C: SNF when bed available Code Status:     Code Status Orders  (From admission, onward)         Start     Ordered   09/07/18 1850  Limited resuscitation (code)  Continuous    Question Answer Comment  In the event of cardiac or respiratory ARREST: Initiate Code Blue, Call Rapid Response No   In the event of cardiac or respiratory ARREST: Perform CPR No   In the event of cardiac or respiratory ARREST: Perform Intubation/Mechanical Ventilation Yes   In the event of cardiac or respiratory ARREST: Use NIPPV/BiPAp only if indicated No   In the event of cardiac or respiratory ARREST: Administer ACLS medications if indicated No   In the event of cardiac or respiratory ARREST: Perform Defibrillation or Cardioversion if indicated No   Comments Continue current care with mechanical ventilation and vasopressor support.      09/07/18 1850        Code Status History    Date Active Date Inactive Code Status Order ID Comments User Context   09/03/2018 2251 09/07/2018 1850 Full Code [id number]  [name], MD Inpatient   09/03/2018 2236 09/03/2018 2251 Full Code [id number]  Iline Oven Inpatient   08/22/2018 2203 08/25/2018 1655 Full Code [id number]  Ivor Costa, MD ED  01/30/2018 0308 01/31/2018 1807 Full Code [id number]  [name], MD ED   05/19/2016 1029 05/19/2016 1721 Full Code [id number]  Leonie Man, MD  Inpatient        IV Access:    Peripheral IV   Procedures and diagnostic studies:   No results found.   Medical Consultants:    None.  Anti-Infectives:   None  Subjective:    Ryan Medina he relates no new complaint is tolerating his diet his pain is well controlled.  Objective:    Vitals:   12/01/18 1941 12/02/18 0456 12/02/18 0829 12/02/18 1021  BP: 119/78 (!) 120/96 (!) 125/100 110/80  Pulse: 84 92 87   Resp: 15 15 14    Temp: 98.3 F (36.8 C) 98 F (36.7 C) 98.2 F (36.8 C)   TempSrc: Oral Oral Oral   SpO2: 100% 100% 100%   Weight:      Height:        Intake/Output Summary (Last 24 hours) at 12/02/2018 1141 Last data filed at 12/01/2018 2018 Gross per 24 hour  Intake 650 ml  Output 350 ml  Net 300 ml   Filed Weights   11/02/18 1806 11/04/18 0831 11/12/18 0742  Weight: 56.2 kg 56.2 kg 56.2 kg    Exam: General exam: In no acute distress. Respiratory system: Good air movement and clear to auscultation. Cardiovascular system: S1 & S2 heard, RRR.  Gastrointestinal system: Abdomen is nondistended, soft and nontender.  Central nervous system: Alert and oriented. No focal neurological deficits. 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 Skin: No rashes, lesions or ulcers Psychiatry: Judgement and insight appear normal. Mood & affect appropriate.    Data Reviewed:    Labs: Basic Metabolic Panel: Recent Labs  Lab 11/26/18 0249 12/02/18 0430  NA  --  136  K  --  3.4*  CL  --  102  CO2  --  27  GLUCOSE  --  207*  BUN  --  11  CREATININE 0.48* 0.52*  CALCIUM  --  8.5*   GFR Estimated Creatinine Clearance: 81 mL/min (A) (by C-G formula based on SCr of 0.52 mg/dL (L)). Liver Function Tests: Recent Labs  Lab 12/02/18 0430  AST 42*  ALT 60*  ALKPHOS 117  BILITOT 0.4  PROT 6.6  ALBUMIN 2.0*   No results for input(s): LIPASE, AMYLASE in the last 168 hours. No results for input(s): AMMONIA in the last 168 hours.  Coagulation profile No results for input(s): INR, PROTIME in the last 168 hours.  CBC: Recent Labs  Lab 11/28/18 0418 11/29/18 0325 11/30/18 0436 12/01/18 0419 12/02/18 0430  WBC 8.8 9.8 8.3 8.5 10.7*  HGB 10.2* 10.2* 10.4* 10.0* 10.9*  HCT 32.2* 31.1* 32.6* 31.5* 34.7*  MCV 90.2 90.1 90.1 90.3 90.6  PLT 385 384 358 344 362   Cardiac Enzymes: No results for input(s): CKTOTAL, CKMB, CKMBINDEX, TROPONINI in the last 168 hours. BNP (last 3 results) No results for input(s): PROBNP in the last 8760 hours. CBG: Recent Labs  Lab 12/01/18 0831 12/01/18 1112 12/01/18 1709 12/01/18 2049 12/02/18 0750  GLUCAP 250* 172* 151* 297* 126*   D-Dimer: No results for input(s): DDIMER in the last 72 hours. Hgb A1c: No results for input(s): HGBA1C in the last 72 hours. Lipid Profile: No results for input(s): CHOL, HDL, LDLCALC, TRIG, CHOLHDL, LDLDIRECT in the last 72 hours. Thyroid function studies: No results for input(s): TSH, T4TOTAL, T3FREE, THYROIDAB in the last 72 hours.  Invalid  input(s): FREET3 Anemia work up: No results for input(s): VITAMINB12, FOLATE, FERRITIN, TIBC, IRON, RETICCTPCT in the last 72 hours. Sepsis Labs: Recent Labs  Lab 11/29/18 0325 11/30/18 0436 12/01/18 0419 12/02/18 0430  WBC 9.8 8.3 8.5 10.7*   Microbiology No results found for this or any previous visit (from the past 240 hour(s)).   Medications:   . apixaban  5 mg Oral BID  . docusate sodium  100 mg Oral Daily  . feeding supplement (ENSURE ENLIVE)  237 mL Oral TID BM  . insulin aspart  0-5 Units Subcutaneous QHS  . insulin aspart  0-9 Units Subcutaneous TID WC  . insulin detemir  5 Units Subcutaneous BID  . pantoprazole  40 mg Oral BID  . polyethylene glycol  17 g Oral Daily  . sodium chloride flush  10-40 mL Intracatheter Q12H  . sucralfate  1 g Oral TID WC & HS   Continuous Infusions: . magnesium sulfate 1 - 4 g bolus IVPB         LOS: 90 days   Charlynne Cousins  [facility]  12/02/2018, 11:41 AM

## 2018-12-02 NOTE — Plan of Care (Signed)
  Problem: Education: Goal: Knowledge of General Education information will improve Description Including pain rating scale, medication(s)/side effects and non-pharmacologic comfort measures  Outcome: Progressing   Problem: Health Behavior/Discharge Planning: Goal: Ability to manage health-related needs will improve Outcome: Progressing   Problem: Pain Managment: Goal: General experience of comfort will improve Outcome: Progressing   Problem: Skin Integrity: Goal: Risk for impaired skin integrity will decrease Outcome: Progressing   Problem: Clinical Measurements: Goal: Ability to maintain clinical measurements within normal limits will improve Outcome: Progressing Goal: Postoperative complications will be avoided or minimized Outcome: Progressing   Problem: Skin Integrity: Goal: Demonstration of wound healing without infection will improve Outcome: Progressing   Problem: Metabolic: Goal: Ability to maintain appropriate glucose levels will improve Outcome: Progressing   Problem: Tissue Perfusion: Goal: Adequacy of tissue perfusion will improve Outcome: Progressing

## 2018-12-02 NOTE — Progress Notes (Signed)
Tech came and stated the pts pulse was elevated, DBP is also elevated. Pulse and pressure to be rechecked.  12/02/18 1600  Vitals  Temp 98.3 F (36.8 C)  Temp Source Oral  BP (!) 122/95  MAP (mmHg) 105  BP Location Right Arm  BP Method Automatic  Patient Position (if appropriate) Sitting  Pulse Rate (!) 112  Pulse Rate Source Monitor  Resp 16

## 2018-12-02 NOTE — Progress Notes (Signed)
VASCULAR SURGERY:  The dressing was just changed by the nurses and they did a good job of packing the wound.  Continue twice daily dressing changes to allow this to heal from the inside out.  We will continue to follow.  [name], MD, [place] [phone] Office: [phone]

## 2018-12-02 NOTE — Progress Notes (Signed)
Occupational Therapy Treatment Patient Details Name: Ryan Medina MRN: [id number] DOB: 12/10/1960 Today's Date: 12/02/2018    History of present illness Pt is a 58 y.o. male with DM, HTN, sCHF EF 45% and polysubstance abuse/homelessness. He presented initially with left leg pain on 12/13, found to have acute thromboembolic ischemic left leg.  He was taken emergently for embolectomy.  Post-op course complicated.  Of primary importance, found to have inoperable pancreatic cancer.  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    OT comments  Pt making progress with functional goals. OT will continue to follow.  Follow Up Recommendations  SNF    Equipment Recommendations  3 in 1 bedside commode    Recommendations for Other Services      Precautions / Restrictions Precautions Precautions: Fall Precaution Comments: Pt reports fall overnight 2/28 trying to use urinal. Required Braces or Orthoses: Other Brace Other Brace: CAM boot (doesn't like to wear) Restrictions Weight Bearing Restrictions: Yes RUE Weight Bearing: Weight bearing as tolerated RLE Weight Bearing: Non weight bearing       Mobility Bed Mobility Overal bed mobility: Modified Independent Bed Mobility: Supine to Sit     Supine to sit: Modified independent (Device/Increase time)     General bed mobility comments: pt sitting EOB upon arrival  Transfers Overall transfer level: Needs assistance Equipment used: Rolling walker (2 wheeled) Transfers: Sit to/from Stand Sit to Stand: Min guard         General transfer comment: Patient required min guard to stand with use of RW. Patient demonstrated appropriate hand placement prior to standing.    Balance Overall balance assessment: Needs assistance Sitting-balance support: Feet supported;No upper extremity supported Sitting balance-Leahy Scale: Good     Standing balance support: Bilateral upper extremity  supported Standing balance-Leahy Scale: Poor Standing balance comment: reliant on BUE support and RW to maintain standing balance                           ADL either performed or assessed with clinical judgement   ADL Overall ADL's : Needs assistance/impaired Eating/Feeding: Independent;Sitting   Grooming: Min guard;Standing;Wash/dry face;Wash/dry Nurse, mental health Details (indicate cue type and reason): assist for balance     Lower Body Bathing: Sitting/lateral leans;Min guard       Lower Body Dressing: Min guard;Sitting/lateral leans   Toilet Transfer: Min guard;Ambulation;RW;BSC;Cueing for safety   Toileting- Clothing Manipulation and Hygiene: Sitting/lateral lean;Supervision/safety       Functional mobility during ADLs: Rolling walker;Min guard       Vision Patient Visual Report: No change from baseline     Perception     Praxis      Cognition Arousal/Alertness: Awake/alert Behavior During Therapy: WFL for tasks assessed/performed Overall Cognitive Status: History of cognitive impairments - at baseline Area of Impairment: Safety/judgement                         Safety/Judgement: Decreased awareness of safety;Decreased awareness of deficits              Exercises Exercises: Amputee General Exercises - Lower Extremity Quad Sets: AROM;Right;10 reps;Seated Long Arc Quad: AROM;Right;10 reps;Seated Straight Leg Raises: AROM;Right;10 reps;Seated Amputee Exercises Hip Extension: AROM;Right;10 reps;Standing   Shoulder Instructions       General Comments Educated patient about importance of achieving full knee extension of RLE. Verbal cues required for proper technique during HEP.  Pertinent Vitals/ Pain       Pain Assessment: 0-10 Pain Score: 3  Pain Location: R residual limb Pain Descriptors / Indicators: Sore Pain Intervention(s): Monitored during session;Repositioned  Home Living                                           Prior Functioning/Environment              Frequency  Min 2X/week        Progress Toward Goals  OT Goals(current goals can now be found in the care plan section)  Progress towards OT goals: Progressing toward goals  Acute Rehab OT Goals Patient Stated Goal: "I want a wheelchair"  Plan Discharge plan remains appropriate    Co-evaluation                 AM-PAC OT "6 Clicks" Daily Activity     Outcome Measure   Help from another person eating meals?: None Help from another person taking care of personal grooming?: A Little Help from another person toileting, which includes using toliet, bedpan, or urinal?: A Little Help from another person bathing (including washing, rinsing, drying)?: A Little Help from another person to put on and taking off regular upper body clothing?: None Help from another person to put on and taking off regular lower body clothing?: A Little 6 Click Score: 20    End of Session Equipment Utilized During Treatment: Rolling walker;Gait belt;Other (comment)(BSC)  OT Visit Diagnosis: Unsteadiness on feet (R26.81);Muscle weakness (generalized) (M62.81) Pain - Right/Left: Right Pain - part of body: Leg   Activity Tolerance Patient tolerated treatment well   Patient Left in bed;with call bell/phone within reach   Nurse Communication          Time: 9509-3267 OT Time Calculation (min): 18 min  Charges: OT General Charges $OT Visit: 1 Visit OT Treatments $Self Care/Home Management : 8-22 mins     Britt Bottom 12/02/2018, 12:36 PM

## 2018-12-03 LAB — CREATININE, SERUM
Creatinine, Ser: 0.56 mg/dL — ABNORMAL LOW (ref 0.61–1.24)
GFR calc Af Amer: >60 mL/min (ref >60)
GFR calc non Af Amer: >60 mL/min (ref >60)

## 2018-12-03 LAB — CBC
HCT: 32.2 % — ABNORMAL LOW (ref 39.0–52.0)
Hemoglobin: 10.3 g/dL — ABNORMAL LOW (ref 13.0–17.0)
MCH: 28.7 pg (ref 26.0–34.0)
MCHC: 32 g/dL (ref 30.0–36.0)
MCV: 89.7 fL (ref 80.0–100.0)
Platelets: 321 10*3/uL (ref 150–400)
RBC: 3.59 MIL/uL — ABNORMAL LOW (ref 4.22–5.81)
RDW: 14.4 % (ref 11.5–15.5)
WBC: 9.4 10*3/uL (ref 4.0–10.5)
nRBC: 0 % (ref 0.0–0.2)

## 2018-12-03 LAB — GLUCOSE, CAPILLARY
Glucose-Capillary: 204 mg/dL — ABNORMAL HIGH (ref 70–99)
Glucose-Capillary: 66 mg/dL — ABNORMAL LOW (ref 70–99)
Glucose-Capillary: 133 mg/dL — ABNORMAL HIGH (ref 70–99)
Glucose-Capillary: 132 mg/dL — ABNORMAL HIGH (ref 70–99)
Glucose-Capillary: 173 mg/dL — ABNORMAL HIGH (ref 70–99)

## 2018-12-03 NOTE — Progress Notes (Signed)
   VASCULAR SURGERY ASSESSMENT & PLAN:   14 Days Post-Op s/p: Right below the knee amputation.  The wound is improving with daily dressing changes.  I will check back next week.  If any problems over the weekend Dr. Donnetta Hutching is on call.    SUBJECTIVE:   No complaints.  PHYSICAL EXAM:   Vitals:   12/02/18 1600 12/02/18 1957 12/03/18 0513 12/03/18 1331  BP: (!) 122/95 (!) 134/94 (!) 141/98 (!) 138/98  Pulse: (!) 112 (!) 104 92 (!) 104  Resp: 16 18 16 16   Temp: 98.3 F (36.8 C) 98.3 F (36.8 C) 98.2 F (36.8 C) 98.1 F (36.7 C)  TempSrc: Oral Oral  Oral  SpO2: 100% 91% 100% 100%  Weight:      Height:       I changed his dressing on his right below the knee amputation.  This is improving with daily dressing changes.  LABS:   Lab Results  Component Value Date   WBC 9.4 12/03/2018   HGB 10.3 (L) 12/03/2018   HCT 32.2 (L) 12/03/2018   MCV 89.7 12/03/2018   PLT 321 12/03/2018   Lab Results  Component Value Date   CREATININE 0.56 (L) 12/03/2018   Lab Results  Component Value Date   INR 1.31 11/02/2018   CBG (last 3)  Recent Labs    12/03/18 0748 12/03/18 1156 12/03/18 1327  GLUCAP 204* 66* 133*    PROBLEM LIST:    Principal Problem:   Popliteal artery embolus (HCC) Active Problems:   DM (diabetes mellitus), type 2, uncontrolled (HCC)   Essential hypertension   Dilated cardiomyopathy (HCC)   Chest pain   AAA (abdominal aortic aneurysm) (HCC)   Ischemic foot   Right leg claudication (HCC)   PVD (peripheral vascular disease) ([place])   Hemorrhagic shock ([place])   Nontraumatic ischemic infarction of muscle of right lower leg   Upper GI bleed   Cocaine abuse ([place])   Acute blood loss anemia   Chronic pain syndrome   Duodenal ulcer with hemorrhage   Pancreatic cancer ([place])   Goals of care, counseling/discussion   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 apixaban  5 mg Oral BID  . docusate sodium  100 mg Oral Daily  . feeding supplement (ENSURE ENLIVE)  237 mL Oral TID BM  . insulin aspart  0-5 Units Subcutaneous QHS  . insulin aspart  0-9 Units Subcutaneous TID WC  . insulin detemir  5 Units Subcutaneous BID  . pantoprazole  40 mg Oral BID  . polyethylene glycol  17 g Oral Daily  . sodium chloride flush  10-40 mL Intracatheter Q12H  . sucralfate  1 g Oral TID WC & HS    [name] Beeper: [phone] Office: [phone] 12/03/2018

## 2018-12-03 NOTE — Progress Notes (Signed)
Hypoglycemic Event  CBG: 66  Treatment: Pt given 1 cup orange juice  Symptoms: Asymptomatic  Follow-up CBG: Time:13:27 CBG Result:133   Comments/MD notified: Dr. Aileen Fass notified    [facility] Suder

## 2018-12-03 NOTE — Progress Notes (Signed)
[facility] PROGRESS NOTE    Progress Note  Ryan Medina  ZOX:[id number] DOB: 03-03-1961 DOA: 09/03/2018 PCP: Patient, No Pcp Per     Brief Narrative:   Ryan Medina is an 58 y.o. male past medical history of diabetes mellitus, hypertension, systolic heart failure with an EF of 45%, polysubstance abuse who presented on 09/03/2018 with right leg pain was found to have an acute thromboembolic ischemic event taken emergent leading to embolectomy Awaiting placement.  Assessment/Plan:   Right leg gangrene due to Popliteal artery embolus/status post embolectomy x2 which led to the [place] on 11/19/2018: PT was consulted and recommended skilled nursing facility. Cpnt narcotics for pain control. Patient is high risk readmission awaiting skilled nursing facility placement.  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 Tolerating diet without any complications.  Acute blood loss anemia on anemia of chronic disease/hematochezia/malignant duodenal ulcer: Really on a PPI and sucralfate, he has received several doses of Feraheme, currently on oral iron. No evidence of bleeding hemoglobin is stable 10.4.  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  Candida esophagitis: He has completed treatment.  Acute Pulmonary embolism: The case was discussed with hematology oncology and the patient and patient refused Lovenox, the patient opted for Eliquis which is super optimal alternative.  Chronic systolic and diastolic heart failure: Stable no signs of overload.  Sepsis from healthcare associated pneumonia/Pseudomonas UTI: He has completed treatment with IV cefepime.  DM (diabetes mellitus), type 2, uncontrolled ([place]) Start long-acting insulin plus sliding  scale insulin.  Disposition: PT OT evaluated the patient and recommended skilled nursing facility for his BKA as well as his malnutrition. Needs rehabilitation at least 5 days a week to facilitate daily activities and transition to home. Rehab is being requested as the most appropriate discharge option for the patient is not felt to be custodial care as evidenced by the patient's inability to perform ADLs and ambulate.  DVT prophylaxis: Eliquis Family Communication:none Disposition Plan/Barrier to D/C: SNF when bed available Code Status:     Code Status Orders  (From admission, onward)         Start     Ordered   09/07/18 1850  Limited resuscitation (code)  Continuous    Question Answer Comment  In the event of cardiac or respiratory ARREST: Initiate Code Blue, Call Rapid Response No   In the event of cardiac or respiratory ARREST: Perform CPR No   In the event of cardiac or respiratory ARREST: Perform Intubation/Mechanical Ventilation Yes   In the event of cardiac or respiratory ARREST: Use NIPPV/BiPAp only if indicated No   In the event of cardiac or respiratory ARREST: Administer ACLS medications if indicated No   In the event of cardiac or respiratory ARREST: Perform Defibrillation or Cardioversion if indicated No   Comments Continue current care with mechanical ventilation and vasopressor support.      09/07/18 1850        Code Status History    Date Active Date Inactive Code Status Order ID Comments User Context   09/03/2018 2251 09/07/2018 1850 Full Code [id number]  [name], MD Inpatient   09/03/2018 2236 09/03/2018 2251 Full Code [id number]  Iline Oven Inpatient   08/22/2018 2203 08/25/2018 1655 Full Code [id number]  Ivor Costa, MD ED   01/30/2018 0308 01/31/2018 1807 Full Code [id number]  [name],  MD ED   05/19/2016 1029 05/19/2016 1721 Full Code [id number]  Leonie Man, MD Inpatient        IV Access:    Peripheral  IV   Procedures and diagnostic studies:   No results found.   Medical Consultants:    None.  Anti-Infectives:   None  Subjective:    Ryan Medina he relates no new complaint.  Objective:    Vitals:   12/02/18 1345 12/02/18 1600 12/02/18 1957 12/03/18 0513  BP: (!) 121/95 (!) 122/95 (!) 134/94 (!) 141/98  Pulse: 100 (!) 112 (!) 104 92  Resp: 16 16 18 16   Temp: 97.9 F (36.6 C) 98.3 F (36.8 C) 98.3 F (36.8 C) 98.2 F (36.8 C)  TempSrc: Oral Oral Oral   SpO2: 100% 100% 91% 100%  Weight:      Height:        Intake/Output Summary (Last 24 hours) at 12/03/2018 1020 Last data filed at 12/03/2018 0600 Gross per 24 hour  Intake 960 ml  Output 1700 ml  Net -740 ml   Filed Weights   11/02/18 1806 11/04/18 0831 11/12/18 0742  Weight: 56.2 kg 56.2 kg 56.2 kg    Exam: General exam: In no acute distress. Respiratory system: Good air movement and clear to auscultation. Cardiovascular system: S1 & S2 heard, RRR.  Gastrointestinal system: Abdomen is nondistended, soft and nontender.  Central nervous system: Alert and oriented. No focal neurological deficits. 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 Skin: No rashes, lesions or ulcers Psychiatry: Judgement and insight appear normal. Mood & affect appropriate.    Data Reviewed:    Labs: Basic Metabolic Panel: Recent Labs  Lab 12/02/18 0430 12/03/18 0403  NA 136  --   K 3.4*  --   CL 102  --   CO2 27  --   GLUCOSE 207*  --   BUN 11  --   CREATININE 0.52* 0.56*  CALCIUM 8.5*  --    GFR Estimated Creatinine Clearance: 81 mL/min (A) (by C-G formula based on SCr of 0.56 mg/dL (L)). Liver Function Tests: Recent Labs  Lab 12/02/18 0430  AST 42*  ALT 60*  ALKPHOS 117  BILITOT 0.4  PROT 6.6  ALBUMIN 2.0*   No results for input(s): LIPASE, AMYLASE in the last 168 hours. No results for input(s): AMMONIA in the last 168 hours. Coagulation profile No results for input(s): INR, PROTIME in  the last 168 hours.  CBC: Recent Labs  Lab 11/29/18 0325 11/30/18 0436 12/01/18 0419 12/02/18 0430 12/03/18 0403  WBC 9.8 8.3 8.5 10.7* 9.4  HGB 10.2* 10.4* 10.0* 10.9* 10.3*  HCT 31.1* 32.6* 31.5* 34.7* 32.2*  MCV 90.1 90.1 90.3 90.6 89.7  PLT 384 358 344 362 321   Cardiac Enzymes: No results for input(s): CKTOTAL, CKMB, CKMBINDEX, TROPONINI in the last 168 hours. BNP (last 3 results) No results for input(s): PROBNP in the last 8760 hours. CBG: Recent Labs  Lab 12/02/18 0750 12/02/18 1205 12/02/18 1751 12/02/18 2236 12/03/18 0748  GLUCAP 126* 143* 167* 177* 204*   D-Dimer: No results for input(s): DDIMER in the last 72 hours. Hgb A1c: No results for input(s): HGBA1C in the last 72 hours. Lipid Profile: No results for input(s): CHOL, HDL, LDLCALC, TRIG, CHOLHDL, LDLDIRECT in the last 72 hours. Thyroid function studies: No results for input(s): TSH, T4TOTAL, T3FREE, THYROIDAB in the last 72 hours.  Invalid input(s): FREET3 Anemia work up: No results for input(s): VITAMINB12, FOLATE, FERRITIN, TIBC,  IRON, RETICCTPCT in the last 72 hours. Sepsis Labs: Recent Labs  Lab 11/30/18 0436 12/01/18 0419 12/02/18 0430 12/03/18 0403  WBC 8.3 8.5 10.7* 9.4   Microbiology No results found for this or any previous visit (from the past 240 hour(s)).   Medications:    apixaban  5 mg Oral BID   docusate sodium  100 mg Oral Daily   feeding supplement (ENSURE ENLIVE)  237 mL Oral TID BM   insulin aspart  0-5 Units Subcutaneous QHS   insulin aspart  0-9 Units Subcutaneous TID WC   insulin detemir  5 Units Subcutaneous BID   pantoprazole  40 mg Oral BID   polyethylene glycol  17 g Oral Daily   sodium chloride flush  10-40 mL Intracatheter Q12H   sucralfate  1 g Oral TID WC & HS   Continuous Infusions:  magnesium sulfate 1 - 4 g bolus IVPB         LOS: 91 days   Charlynne Cousins  [facility]  12/03/2018, 10:20 AM

## 2018-12-03 NOTE — Progress Notes (Signed)
CSW reached out to [facility] and [facility] to follow up on referrals sent, both report they are continuing to review patient chart and will update CSW when admission or denial is decided.   [place], Ryan Medina

## 2018-12-03 NOTE — Plan of Care (Signed)
  Problem: Pain Managment: Goal: General experience of comfort will improve Outcome: Progressing   Problem: Skin Integrity: Goal: Risk for impaired skin integrity will decrease Outcome: Progressing   Problem: Skin Integrity: Goal: Demonstration of wound healing without infection will improve Outcome: Progressing

## 2018-12-04 LAB — GLUCOSE, CAPILLARY
Glucose-Capillary: 196 mg/dL — ABNORMAL HIGH (ref 70–99)
Glucose-Capillary: 89 mg/dL (ref 70–99)
Glucose-Capillary: 115 mg/dL — ABNORMAL HIGH (ref 70–99)
Glucose-Capillary: 473 mg/dL — ABNORMAL HIGH (ref 70–99)
Glucose-Capillary: 98 mg/dL (ref 70–99)

## 2018-12-04 LAB — BASIC METABOLIC PANEL
Anion gap: 8 (ref 5–15)
BUN: 8 mg/dL (ref 6–20)
CALCIUM: 8.2 mg/dL — AB (ref 8.9–10.3)
CHLORIDE: 104 mmol/L (ref 98–111)
CO2: 26 mmol/L (ref 22–32)
Creatinine, Ser: 0.57 mg/dL — ABNORMAL LOW (ref 0.61–1.24)
Glucose, Bld: 179 mg/dL — ABNORMAL HIGH (ref 70–99)
Potassium: 2.7 mmol/L — CL (ref 3.5–5.1)
Sodium: 138 mmol/L (ref 135–145)

## 2018-12-04 LAB — CBC
HCT: 30.7 % — ABNORMAL LOW (ref 39.0–52.0)
Hemoglobin: 10.2 g/dL — ABNORMAL LOW (ref 13.0–17.0)
MCH: 29.8 pg (ref 26.0–34.0)
MCHC: 33.2 g/dL (ref 30.0–36.0)
MCV: 89.8 fL (ref 80.0–100.0)
Platelets: 268 10*3/uL (ref 150–400)
RBC: 3.42 MIL/uL — ABNORMAL LOW (ref 4.22–5.81)
RDW: 14.4 % (ref 11.5–15.5)
WBC: 10.2 10*3/uL (ref 4.0–10.5)
nRBC: 0 % (ref 0.0–0.2)

## 2018-12-04 MED ORDER — POTASSIUM CHLORIDE CRYS ER 20 MEQ PO TBCR
40.0000 meq | EXTENDED_RELEASE_TABLET | ORAL | Status: AC
  Administered 2018-12-04 – 2018-12-05 (×2): 40 meq via ORAL
  Filled 2018-12-04 (×2): qty 2

## 2018-12-04 MED ORDER — POTASSIUM CHLORIDE CRYS ER 20 MEQ PO TBCR
40.0000 meq | EXTENDED_RELEASE_TABLET | Freq: Once | ORAL | Status: AC
  Administered 2018-12-05: 40 meq via ORAL
  Filled 2018-12-04: qty 2

## 2018-12-04 NOTE — Progress Notes (Signed)
[facility] PROGRESS NOTE    Progress Note  Ryan Medina  NOM:[id number] DOB: [birth year] DOA: 09/03/2018 PCP: Patient, No Pcp Per     Brief Narrative:   Ryan Medina is an 58 y.o. male past medical history of diabetes mellitus, hypertension, systolic heart failure with an EF of 45%, polysubstance abuse who presented on 09/03/2018 with right leg pain was found to have an acute thromboembolic ischemic event taken emergent leading to embolectomy Awaiting placement.  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 Cpnt narcotics for pain control. Patient is high risk readmission awaiting skilled nursing facility placement.  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 Tolerating diet without any complications.  Acute blood loss anemia on anemia of chronic disease/hematochezia/malignant duodenal ulcer: Really on a PPI and sucralfate, he has received several doses of Feraheme, currently on oral iron. No evidence of bleeding hemoglobin is stable 10.4.  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  Candida esophagitis: He has completed treatment.  Acute Pulmonary embolism: The case was discussed with hematology oncology and the patient and patient refused Lovenox, the patient opted for Eliquis which is super optimal alternative.  Chronic systolic and diastolic heart failure: Stable no signs of overload.  Sepsis from healthcare associated pneumonia/Pseudomonas UTI: He has completed treatment with IV cefepime.  DM (diabetes mellitus), type 2, uncontrolled ([place]) Start long-acting insulin plus sliding  scale insulin.  Disposition: PT OT evaluated the patient and recommended skilled nursing facility for his BKA as well as his malnutrition. Needs rehabilitation at least 5 days a week to facilitate daily activities and transition to home. Rehab is being requested as the most appropriate discharge option for the patient is not felt to be custodial care as evidenced by the patient's inability to perform ADLs and ambulate.  DVT prophylaxis: Eliquis Family Communication:none Disposition Plan/Barrier to D/C: SNF when bed available Code Status:     Code Status Orders  (From admission, onward)         Start     Ordered   09/07/18 1850  Limited resuscitation (code)  Continuous    Question Answer Comment  In the event of cardiac or respiratory ARREST: Initiate Code Blue, Call Rapid Response No   In the event of cardiac or respiratory ARREST: Perform CPR No   In the event of cardiac or respiratory ARREST: Perform Intubation/Mechanical Ventilation Yes   In the event of cardiac or respiratory ARREST: Use NIPPV/BiPAp only if indicated No   In the event of cardiac or respiratory ARREST: Administer ACLS medications if indicated No   In the event of cardiac or respiratory ARREST: Perform Defibrillation or Cardioversion if indicated No   Comments Continue current care with mechanical ventilation and vasopressor support.      09/07/18 1850        Code Status History    Date Active Date Inactive Code Status Order ID Comments User Context   09/03/2018 2251 09/07/2018 1850 Full Code [id number]  [name], MD Inpatient   09/03/2018 2236 09/03/2018 2251 Full Code [id number]  Iline Oven Inpatient   08/22/2018 2203 08/25/2018 1655 Full Code [id number]  Ivor Costa, MD ED   01/30/2018 0308 01/31/2018 1807 Full Code [id number]  [name],  MD ED   05/19/2016 1029 05/19/2016 1721 Full Code [id number]  Leonie Man, MD Inpatient        IV Access:    Peripheral  IV   Procedures and diagnostic studies:   No results found.   Medical Consultants:    None.  Anti-Infectives:   None  Subjective:    Ryan Medina has no new complaints.  Objective:    Vitals:   12/03/18 0513 12/03/18 1331 12/03/18 2125 12/04/18 0409  BP: (!) 141/98 (!) 138/98 (!) 131/107 (!) 116/94  Pulse: 92 (!) 104 (!) 111 (!) 104  Resp: 16 16    Temp: 98.2 F (36.8 C) 98.1 F (36.7 C) 98.7 F (37.1 C) 98.3 F (36.8 C)  TempSrc:  Oral Oral Oral  SpO2: 100% 100% 100% 98%  Weight:      Height:        Intake/Output Summary (Last 24 hours) at 12/04/2018 0834 Last data filed at 12/03/2018 1628 Gross per 24 hour  Intake --  Output 180 ml  Net -180 ml   Filed Weights   11/02/18 1806 11/04/18 0831 11/12/18 0742  Weight: 56.2 kg 56.2 kg 56.2 kg    Exam: General exam: In no acute distress. Respiratory system: Good air movement and clear to auscultation. Cardiovascular system: S1 & S2 heard, RRR.  Gastrointestinal system: Abdomen is nondistended, soft and nontender.  Central nervous system: Alert and oriented. No focal neurological deficits. 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 Skin: No rashes, lesions or ulcers Psychiatry: Judgement and insight appear normal. Mood & affect appropriate.    Data Reviewed:    Labs: Basic Metabolic Panel: Recent Labs  Lab 12/02/18 0430 12/03/18 0403  NA 136  --   K 3.4*  --   CL 102  --   CO2 27  --   GLUCOSE 207*  --   BUN 11  --   CREATININE 0.52* 0.56*  CALCIUM 8.5*  --    GFR Estimated Creatinine Clearance: 81 mL/min (A) (by C-G formula based on SCr of 0.56 mg/dL (L)). Liver Function Tests: Recent Labs  Lab 12/02/18 0430  AST 42*  ALT 60*  ALKPHOS 117  BILITOT 0.4  PROT 6.6  ALBUMIN 2.0*   No results for input(s): LIPASE, AMYLASE in the last 168 hours. No results for input(s): AMMONIA in the last 168 hours. Coagulation profile No results for input(s): INR, PROTIME in the last  168 hours.  CBC: Recent Labs  Lab 11/30/18 0436 12/01/18 0419 12/02/18 0430 12/03/18 0403 12/04/18 0353  WBC 8.3 8.5 10.7* 9.4 10.2  HGB 10.4* 10.0* 10.9* 10.3* 10.2*  HCT 32.6* 31.5* 34.7* 32.2* 30.7*  MCV 90.1 90.3 90.6 89.7 89.8  PLT 358 344 362 321 268   Cardiac Enzymes: No results for input(s): CKTOTAL, CKMB, CKMBINDEX, TROPONINI in the last 168 hours. BNP (last 3 results) No results for input(s): PROBNP in the last 8760 hours. CBG: Recent Labs  Lab 12/03/18 1156 12/03/18 1327 12/03/18 1702 12/03/18 2122 12/04/18 0741  GLUCAP 66* 133* 132* 173* 196*   D-Dimer: No results for input(s): DDIMER in the last 72 hours. Hgb A1c: No results for input(s): HGBA1C in the last 72 hours. Lipid Profile: No results for input(s): CHOL, HDL, LDLCALC, TRIG, CHOLHDL, LDLDIRECT in the last 72 hours. Thyroid function studies: No results for input(s): TSH, T4TOTAL, T3FREE, THYROIDAB in the last 72 hours.  Invalid input(s): FREET3 Anemia work up: No results for input(s): VITAMINB12, FOLATE, FERRITIN, TIBC, IRON,  RETICCTPCT in the last 72 hours. Sepsis Labs: Recent Labs  Lab 12/01/18 0419 12/02/18 0430 12/03/18 0403 12/04/18 0353  WBC 8.5 10.7* 9.4 10.2   Microbiology No results found for this or any previous visit (from the past 240 hour(s)).   Medications:    apixaban  5 mg Oral BID   docusate sodium  100 mg Oral Daily   feeding supplement (ENSURE ENLIVE)  237 mL Oral TID BM   insulin aspart  0-5 Units Subcutaneous QHS   insulin aspart  0-9 Units Subcutaneous TID WC   insulin detemir  5 Units Subcutaneous BID   pantoprazole  40 mg Oral BID   polyethylene glycol  17 g Oral Daily   sodium chloride flush  10-40 mL Intracatheter Q12H   sucralfate  1 g Oral TID WC & HS   Continuous Infusions:  magnesium sulfate 1 - 4 g bolus IVPB         LOS: 92 days   Charlynne Cousins  [facility]  12/04/2018, 8:34 AM

## 2018-12-05 LAB — GLUCOSE, CAPILLARY
GLUCOSE-CAPILLARY: 97 mg/dL (ref 70–99)
Glucose-Capillary: 127 mg/dL — ABNORMAL HIGH (ref 70–99)
Glucose-Capillary: 253 mg/dL — ABNORMAL HIGH (ref 70–99)
Glucose-Capillary: 65 mg/dL — ABNORMAL LOW (ref 70–99)
Glucose-Capillary: 195 mg/dL — ABNORMAL HIGH (ref 70–99)

## 2018-12-05 LAB — CBC
HCT: 32.3 % — ABNORMAL LOW (ref 39.0–52.0)
Hemoglobin: 10.5 g/dL — ABNORMAL LOW (ref 13.0–17.0)
MCH: 29.7 pg (ref 26.0–34.0)
MCHC: 32.5 g/dL (ref 30.0–36.0)
MCV: 91.2 fL (ref 80.0–100.0)
Platelets: 269 K/uL (ref 150–400)
RBC: 3.54 MIL/uL — ABNORMAL LOW (ref 4.22–5.81)
RDW: 14.6 % (ref 11.5–15.5)
WBC: 9.4 K/uL (ref 4.0–10.5)
nRBC: 0 % (ref 0.0–0.2)

## 2018-12-05 LAB — BASIC METABOLIC PANEL
Anion gap: 7 (ref 5–15)
BUN: 9 mg/dL (ref 6–20)
CHLORIDE: 102 mmol/L (ref 98–111)
CO2: 27 mmol/L (ref 22–32)
CREATININE: 0.56 mg/dL — AB (ref 0.61–1.24)
Calcium: 8.3 mg/dL — ABNORMAL LOW (ref 8.9–10.3)
GFR calc Af Amer: >60 mL/min (ref >60)
GFR calc non Af Amer: >60 mL/min (ref >60)
Glucose, Bld: 306 mg/dL — ABNORMAL HIGH (ref 70–99)
Potassium: 3.7 mmol/L (ref 3.5–5.1)
Sodium: 136 mmol/L (ref 135–145)

## 2018-12-05 NOTE — Progress Notes (Signed)
[facility] PROGRESS NOTE    Progress Note  Ryan Medina  IOE:[id number] DOB: 04-22-1961 DOA: 09/03/2018 PCP: Patient, No Pcp Per     Brief Narrative:   Ryan Medina is an 58 y.o. male past medical history of diabetes mellitus, hypertension, systolic heart failure with an EF of 45%, polysubstance abuse who presented on 09/03/2018 with right leg pain was found to have an acute thromboembolic ischemic event taken emergent leading to embolectomy Awaiting placement.  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 Cpnt narcotics for pain control. Patient is high risk readmission awaiting skilled nursing facility placement.  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 Tolerating diet without any complications.  Acute blood loss anemia on anemia of chronic disease/hematochezia/malignant duodenal ulcer: Really on a PPI and sucralfate, he has received several doses of Feraheme, currently on oral iron. No evidence of bleeding hemoglobin is stable 10.4.  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  Candida esophagitis: He has completed treatment.  Acute Pulmonary embolism: The case was discussed with hematology oncology and the patient and patient refused Lovenox, the patient opted for Eliquis which is super optimal alternative.  Chronic systolic and diastolic heart failure: Stable no signs of overload.  Sepsis from healthcare associated pneumonia/Pseudomonas UTI: He has completed treatment with IV cefepime.  DM (diabetes mellitus), type 2, uncontrolled ([place]) Start long-acting insulin plus sliding  scale insulin.  Disposition: PT OT evaluated the patient and recommended skilled nursing facility for his BKA. Needs rehabilitation at least 5 days a week to facilitate daily activities and transition to home. Rehab is being requested as the most appropriate discharge option for the patient is not felt to be custodial care as evidenced by the patient's inability to perform ADLs and ambulate.  DVT prophylaxis: Eliquis Family Communication:none Disposition Plan/Barrier to D/C: SNF when bed available Code Status:     Code Status Orders  (From admission, onward)         Start     Ordered   09/07/18 1850  Limited resuscitation (code)  Continuous    Question Answer Comment  In the event of cardiac or respiratory ARREST: Initiate Code Blue, Call Rapid Response No   In the event of cardiac or respiratory ARREST: Perform CPR No   In the event of cardiac or respiratory ARREST: Perform Intubation/Mechanical Ventilation Yes   In the event of cardiac or respiratory ARREST: Use NIPPV/BiPAp only if indicated No   In the event of cardiac or respiratory ARREST: Administer ACLS medications if indicated No   In the event of cardiac or respiratory ARREST: Perform Defibrillation or Cardioversion if indicated No   Comments Continue current care with mechanical ventilation and vasopressor support.      09/07/18 1850        Code Status History    Date Active Date Inactive Code Status Order ID Comments User Context   09/03/2018 2251 09/07/2018 1850 Full Code [id number]  [name], MD Inpatient   09/03/2018 2236 09/03/2018 2251 Full Code [id number]  Iline Oven Inpatient   08/22/2018 2203 08/25/2018 1655 Full Code [id number]  Ivor Costa, MD ED   01/30/2018 0308 01/31/2018 1807 Full Code [id number]  [name], MD ED   05/19/2016  1029 05/19/2016 1721 Full Code [id number]  Leonie Man, MD Inpatient        IV Access:    Peripheral IV   Procedures and diagnostic  studies:   No results found.   Medical Consultants:    None.  Anti-Infectives:   None  Subjective:    Ryan Medina has no new complaints.  Objective:    Vitals:   12/04/18 0409 12/04/18 1559 12/04/18 2114 12/05/18 0339  BP: (!) 116/94 118/84 (!) 119/97 (!) 132/91  Pulse: (!) 104 61 (!) 55 82  Resp:      Temp: 98.3 F (36.8 C) 98.3 F (36.8 C) 98.4 F (36.9 C) 98.5 F (36.9 C)  TempSrc: Oral Oral Oral Oral  SpO2: 98% 100% 100% 100%  Weight:      Height:        Intake/Output Summary (Last 24 hours) at 12/05/2018 1021 Last data filed at 12/04/2018 1350 Gross per 24 hour  Intake 360 ml  Output -  Net 360 ml   Filed Weights   11/02/18 1806 11/04/18 0831 11/12/18 0742  Weight: 56.2 kg 56.2 kg 56.2 kg    Exam: General exam: In no acute distress. Respiratory system: Good air movement and clear to auscultation. Cardiovascular system: S1 & S2 heard, RRR.  Gastrointestinal system: Abdomen is nondistended, soft and nontender.  Central nervous system: Alert and oriented. No focal neurological deficits. 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 Skin: No rashes, lesions or ulcers Psychiatry: Judgement and insight appear normal. Mood & affect appropriate.    Data Reviewed:    Labs: Basic Metabolic Panel: Recent Labs  Lab 12/02/18 0430 12/03/18 0403 12/04/18 1830 12/05/18 0349  NA 136  --  138 136  K 3.4*  --  2.7* 3.7  CL 102  --  104 102  CO2 27  --  26 27  GLUCOSE 207*  --  179* 306*  BUN 11  --  8 9  CREATININE 0.52* 0.56* 0.57* 0.56*  CALCIUM 8.5*  --  8.2* 8.3*   GFR Estimated Creatinine Clearance: 81 mL/min (A) (by C-G formula based on SCr of 0.56 mg/dL (L)). Liver Function Tests: Recent Labs  Lab 12/02/18 0430  AST 42*  ALT 60*  ALKPHOS 117  BILITOT 0.4  PROT 6.6  ALBUMIN 2.0*   No results for input(s): LIPASE, AMYLASE in the last 168 hours. No results for input(s): AMMONIA in the last 168 hours. Coagulation profile No  results for input(s): INR, PROTIME in the last 168 hours.  CBC: Recent Labs  Lab 12/01/18 0419 12/02/18 0430 12/03/18 0403 12/04/18 0353 12/05/18 0349  WBC 8.5 10.7* 9.4 10.2 9.4  HGB 10.0* 10.9* 10.3* 10.2* 10.5*  HCT 31.5* 34.7* 32.2* 30.7* 32.3*  MCV 90.3 90.6 89.7 89.8 91.2  PLT 344 362 321 268 269   Cardiac Enzymes: No results for input(s): CKTOTAL, CKMB, CKMBINDEX, TROPONINI in the last 168 hours. BNP (last 3 results) No results for input(s): PROBNP in the last 8760 hours. CBG: Recent Labs  Lab 12/04/18 1039 12/04/18 1151 12/04/18 1725 12/04/18 2118 12/05/18 0831  GLUCAP 89 115* 473* 98 127*   D-Dimer: No results for input(s): DDIMER in the last 72 hours. Hgb A1c: No results for input(s): HGBA1C in the last 72 hours. Lipid Profile: No results for input(s): CHOL, HDL, LDLCALC, TRIG, CHOLHDL, LDLDIRECT in the last 72 hours. Thyroid function studies: No results for input(s): TSH, T4TOTAL, T3FREE, THYROIDAB in the last 72 hours.  Invalid input(s):  FREET3 Anemia work up: No results for input(s): VITAMINB12, FOLATE, FERRITIN, TIBC, IRON, RETICCTPCT in the last 72 hours. Sepsis Labs: Recent Labs  Lab 12/02/18 0430 12/03/18 0403 12/04/18 0353 12/05/18 0349  WBC 10.7* 9.4 10.2 9.4   Microbiology No results found for this or any previous visit (from the past 240 hour(s)).   Medications:   . apixaban  5 mg Oral BID  . docusate sodium  100 mg Oral Daily  . feeding supplement (ENSURE ENLIVE)  237 mL Oral TID BM  . insulin aspart  0-5 Units Subcutaneous QHS  . insulin aspart  0-9 Units Subcutaneous TID WC  . insulin detemir  5 Units Subcutaneous BID  . pantoprazole  40 mg Oral BID  . polyethylene glycol  17 g Oral Daily  . potassium chloride  40 mEq Oral Once  . sodium chloride flush  10-40 mL Intracatheter Q12H  . sucralfate  1 g Oral TID WC & HS   Continuous Infusions: . magnesium sulfate 1 - 4 g bolus IVPB         LOS: 93 days   Charlynne Cousins  [facility]  12/05/2018, 10:21 AM

## 2018-12-05 NOTE — Plan of Care (Signed)
  Problem: Education: Goal: Knowledge of General Education information will improve Description Including pain rating scale, medication(s)/side effects and non-pharmacologic comfort measures  Outcome: Progressing   Problem: Health Behavior/Discharge Planning: Goal: Ability to manage health-related needs will improve Outcome: Progressing   Problem: Pain Managment: Goal: General experience of comfort will improve Outcome: Progressing   Problem: Skin Integrity: Goal: Risk for impaired skin integrity will decrease Outcome: Progressing   Problem: Clinical Measurements: Goal: Ability to maintain clinical measurements within normal limits will improve Outcome: Progressing Goal: Postoperative complications will be avoided or minimized Outcome: Progressing   Problem: Skin Integrity: Goal: Demonstration of wound healing without infection will improve Outcome: Progressing   Problem: Metabolic: Goal: Ability to maintain appropriate glucose levels will improve Outcome: Progressing   Problem: Tissue Perfusion: Goal: Adequacy of tissue perfusion will improve Outcome: Progressing

## 2018-12-05 NOTE — Plan of Care (Signed)
  Problem: Pain Managment: Goal: General experience of comfort will improve Outcome: Progressing   Problem: Skin Integrity: Goal: Demonstration of wound healing without infection will improve Outcome: Progressing

## 2018-12-06 ENCOUNTER — Inpatient Hospital Stay (HOSPITAL_COMMUNITY): Payer: Medicaid Other

## 2018-12-06 LAB — CBC WITH DIFFERENTIAL/PLATELET
ABS IMMATURE GRANULOCYTES: 0.04 10*3/uL (ref 0.00–0.07)
Basophils Absolute: 0 10*3/uL (ref 0.0–0.1)
Basophils Relative: 0 %
Eosinophils Absolute: 0 10*3/uL (ref 0.0–0.5)
Eosinophils Relative: 0 %
HCT: 33.1 % — ABNORMAL LOW (ref 39.0–52.0)
Hemoglobin: 10.5 g/dL — ABNORMAL LOW (ref 13.0–17.0)
IMMATURE GRANULOCYTES: 0 %
Lymphocytes Relative: 11 %
Lymphs Abs: 1 10*3/uL (ref 0.7–4.0)
MCH: 28.5 pg (ref 26.0–34.0)
MCHC: 31.7 g/dL (ref 30.0–36.0)
MCV: 89.9 fL (ref 80.0–100.0)
MONOS PCT: 7 %
Monocytes Absolute: 0.7 10*3/uL (ref 0.1–1.0)
NEUTROS PCT: 82 %
Neutro Abs: 7.6 10*3/uL (ref 1.7–7.7)
Platelets: 239 10*3/uL (ref 150–400)
RBC: 3.68 MIL/uL — ABNORMAL LOW (ref 4.22–5.81)
RDW: 14.3 % (ref 11.5–15.5)
WBC: 9.4 10*3/uL (ref 4.0–10.5)
nRBC: 0 % (ref 0.0–0.2)

## 2018-12-06 LAB — GLUCOSE, CAPILLARY
GLUCOSE-CAPILLARY: 112 mg/dL — AB (ref 70–99)
GLUCOSE-CAPILLARY: 67 mg/dL — AB (ref 70–99)
Glucose-Capillary: 91 mg/dL (ref 70–99)
Glucose-Capillary: 113 mg/dL — ABNORMAL HIGH (ref 70–99)
Glucose-Capillary: 211 mg/dL — ABNORMAL HIGH (ref 70–99)
Glucose-Capillary: 142 mg/dL — ABNORMAL HIGH (ref 70–99)

## 2018-12-06 LAB — BASIC METABOLIC PANEL
Anion gap: 4 — ABNORMAL LOW (ref 5–15)
BUN: 11 mg/dL (ref 6–20)
CO2: 28 mmol/L (ref 22–32)
Calcium: 8.4 mg/dL — ABNORMAL LOW (ref 8.9–10.3)
Chloride: 104 mmol/L (ref 98–111)
Creatinine, Ser: 0.44 mg/dL — ABNORMAL LOW (ref 0.61–1.24)
GFR calc Af Amer: >60 mL/min (ref >60)
GFR calc non Af Amer: >60 mL/min (ref >60)
Glucose, Bld: 49 mg/dL — ABNORMAL LOW (ref 70–99)
Potassium: 4.1 mmol/L (ref 3.5–5.1)
Sodium: 136 mmol/L (ref 135–145)

## 2018-12-06 LAB — CBC
HEMATOCRIT: 33.8 % — AB (ref 39.0–52.0)
HEMOGLOBIN: 10.8 g/dL — AB (ref 13.0–17.0)
MCH: 28.8 pg (ref 26.0–34.0)
MCHC: 32 g/dL (ref 30.0–36.0)
MCV: 90.1 fL (ref 80.0–100.0)
Platelets: 255 10*3/uL (ref 150–400)
RBC: 3.75 MIL/uL — ABNORMAL LOW (ref 4.22–5.81)
RDW: 14.4 % (ref 11.5–15.5)
WBC: 11.1 10*3/uL — ABNORMAL HIGH (ref 4.0–10.5)
nRBC: 0 % (ref 0.0–0.2)

## 2018-12-06 LAB — TYPE AND SCREEN
ABO/RH(D): B POS
Antibody Screen: NEGATIVE

## 2018-12-06 MED ORDER — MORPHINE SULFATE (PF) 4 MG/ML IV SOLN: 4.0000 mg | INTRAVENOUS | Status: DC | PRN

## 2018-12-06 MED ORDER — SODIUM CHLORIDE 0.9 % IV SOLN
INTRAVENOUS | Status: AC
  Administered 2018-12-06: 19:00:00 via INTRAVENOUS

## 2018-12-06 MED ORDER — SODIUM CHLORIDE 0.9 % IV BOLUS
1000.0000 mL | Freq: Once | INTRAVENOUS | Status: AC
  Administered 2018-12-06: 1000 mL via INTRAVENOUS

## 2018-12-06 MED ORDER — IOHEXOL 300 MG/ML  SOLN
100.0000 mL | Freq: Once | INTRAMUSCULAR | Status: AC | PRN
  Administered 2018-12-06: 100 mL via INTRAVENOUS

## 2018-12-06 MED ORDER — DEXTROSE 50 % IV SOLN
INTRAVENOUS | Status: AC
  Administered 2018-12-06: 25 mL
  Filled 2018-12-06: qty 50

## 2018-12-06 NOTE — Progress Notes (Signed)
[facility] PROGRESS NOTE    Progress Note  Nyaire Denbleyker Fitch  KYH:[id number] DOB: 30-Jan-1961 DOA: 09/03/2018 PCP: Patient, No Pcp Per     Brief Narrative:   Farris Geiman Cajamarca is an 58 y.o. male past medical history of diabetes mellitus, hypertension, systolic heart failure with an EF of 45%, polysubstance abuse who presented on 09/03/2018 with right leg pain was found to have an acute thromboembolic ischemic event taken emergent leading to embolectomy Awaiting placement.  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 Tolerating diet without any complications.  Acute blood loss anemia on anemia of chronic disease/hematochezia/malignant duodenal ulcer: Really on a PPI and sucralfate, he has received several doses of Feraheme, currently on oral iron. No evidence of bleeding hemoglobin is stable 10.4.  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  Candida esophagitis: He has completed treatment.  Acute Pulmonary embolism: The case was discussed with hematology oncology and the patient and patient refused Lovenox, the patient opted for Eliquis which is super optimal alternative.  Chronic systolic and diastolic heart failure: Stable no signs of overload.  Sepsis from healthcare associated pneumonia/Pseudomonas UTI: He has completed treatment with IV cefepime.  DM (diabetes mellitus), type 2, uncontrolled ([place]) Start long-acting insulin plus sliding scale insulin.  Disposition: PT OT evaluated the patient and recommended skilled nursing  facility for his BKA. Needs rehabilitation at least 5 days a week to facilitate daily activities and transition to home. Rehab is being requested as the most appropriate discharge option for the patient is not felt to be custodial care as evidenced by the patient's inability to perform ADLs and ambulate.  DVT prophylaxis: Eliquis Family Communication:none Disposition Plan/Barrier to D/C: SNF when bed available Code Status:     Code Status Orders  (From admission, onward)         Start     Ordered   09/07/18 1850  Limited resuscitation (code)  Continuous    Question Answer Comment  In the event of cardiac or respiratory ARREST: Initiate Code Blue, Call Rapid Response No   In the event of cardiac or respiratory ARREST: Perform CPR No   In the event of cardiac or respiratory ARREST: Perform Intubation/Mechanical Ventilation Yes   In the event of cardiac or respiratory ARREST: Use NIPPV/BiPAp only if indicated No   In the event of cardiac or respiratory ARREST: Administer ACLS medications if indicated No   In the event of cardiac or respiratory ARREST: Perform Defibrillation or Cardioversion if indicated No   Comments Continue current care with mechanical ventilation and vasopressor support.      09/07/18 1850        Code Status History    Date Active Date Inactive Code Status Order ID Comments User Context   09/03/2018 2251 09/07/2018 1850 Full Code [id number]  [name], MD Inpatient   09/03/2018 2236 09/03/2018 2251 Full Code [id number]  Iline Oven Inpatient   08/22/2018 2203 08/25/2018 1655 Full Code [id number]  Ivor Costa, MD ED   01/30/2018 0308 01/31/2018 1807 Full Code [id number]  [name], MD ED   05/19/2016 1029 05/19/2016 1721 Full Code [id number]  Leonie Man, MD Inpatient  IV Access:    Peripheral IV   Procedures and diagnostic studies:   No results found.   Medical Consultants:    None.  Anti-Infectives:    None  Subjective:    Nicola Police Placeres has no new complaints.  Objective:    Vitals:   12/05/18 0339 12/05/18 1633 12/05/18 2100 12/06/18 0358  BP: (!) 132/91 (!) 120/93 (!) 121/91 108/85  Pulse: 82 78 (!) 53 96  Resp:   15 15  Temp: 98.5 F (36.9 C)  98.4 F (36.9 C) 97.8 F (36.6 C)  TempSrc: Oral  Oral Oral  SpO2: 100% 100% 91% 100%  Weight:      Height:        Intake/Output Summary (Last 24 hours) at 12/06/2018 0841 Last data filed at 12/06/2018 0435 Gross per 24 hour  Intake 770 ml  Output 100 ml  Net 670 ml   Filed Weights   11/02/18 1806 11/04/18 0831 11/12/18 0742  Weight: 56.2 kg 56.2 kg 56.2 kg    Exam: General exam: In no acute distress. Respiratory system: Good air movement and clear to auscultation. Cardiovascular system: S1 & S2 heard, RRR.  Gastrointestinal system: Abdomen is nondistended, soft and nontender.  Central nervous system: Alert and oriented. No focal neurological deficits. 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 Skin: No rashes, lesions or ulcers Psychiatry: Judgement and insight appear normal. Mood & affect appropriate.    Data Reviewed:    Labs: Basic Metabolic Panel: Recent Labs  Lab 12/02/18 0430 12/03/18 0403 12/04/18 1830 12/05/18 0349 12/06/18 0306  NA 136  --  138 136 136  K 3.4*  --  2.7* 3.7 4.1  CL 102  --  104 102 104  CO2 27  --  26 27 28   GLUCOSE 207*  --  179* 306* 49*  BUN 11  --  8 9 11   CREATININE 0.52* 0.56* 0.57* 0.56* 0.44*  CALCIUM 8.5*  --  8.2* 8.3* 8.4*   GFR Estimated Creatinine Clearance: 81 mL/min (A) (by C-G formula based on SCr of 0.44 mg/dL (L)). Liver Function Tests: Recent Labs  Lab 12/02/18 0430  AST 42*  ALT 60*  ALKPHOS 117  BILITOT 0.4  PROT 6.6  ALBUMIN 2.0*   No results for input(s): LIPASE, AMYLASE in the last 168 hours. No results for input(s): AMMONIA in the last 168 hours. Coagulation profile No results for input(s): INR, PROTIME in the last 168  hours.  CBC: Recent Labs  Lab 12/02/18 0430 12/03/18 0403 12/04/18 0353 12/05/18 0349 12/06/18 0306  WBC 10.7* 9.4 10.2 9.4 11.1*  HGB 10.9* 10.3* 10.2* 10.5* 10.8*  HCT 34.7* 32.2* 30.7* 32.3* 33.8*  MCV 90.6 89.7 89.8 91.2 90.1  PLT 362 321 268 269 255   Cardiac Enzymes: No results for input(s): CKTOTAL, CKMB, CKMBINDEX, TROPONINI in the last 168 hours. BNP (last 3 results) No results for input(s): PROBNP in the last 8760 hours. CBG: Recent Labs  Lab 12/05/18 1205 12/05/18 1627 12/05/18 1832 12/05/18 2103 12/06/18 0823  GLUCAP 253* 65* 97 195* 91   D-Dimer: No results for input(s): DDIMER in the last 72 hours. Hgb A1c: No results for input(s): HGBA1C in the last 72 hours. Lipid Profile: No results for input(s): CHOL, HDL, LDLCALC, TRIG, CHOLHDL, LDLDIRECT in the last 72 hours. Thyroid function studies: No results for input(s): TSH, T4TOTAL, T3FREE, THYROIDAB in the last 72 hours.  Invalid input(s): FREET3 Anemia work up: No results for input(s): VITAMINB12, FOLATE, FERRITIN, TIBC,  IRON, RETICCTPCT in the last 72 hours. Sepsis Labs: Recent Labs  Lab 12/03/18 0403 12/04/18 0353 12/05/18 0349 12/06/18 0306  WBC 9.4 10.2 9.4 11.1*   Microbiology No results found for this or any previous visit (from the past 240 hour(s)).   Medications:    apixaban  5 mg Oral BID   docusate sodium  100 mg Oral Daily   feeding supplement (ENSURE ENLIVE)  237 mL Oral TID BM   insulin aspart  0-5 Units Subcutaneous QHS   insulin aspart  0-9 Units Subcutaneous TID WC   insulin detemir  5 Units Subcutaneous BID   pantoprazole  40 mg Oral BID   polyethylene glycol  17 g Oral Daily   sodium chloride flush  10-40 mL Intracatheter Q12H   sucralfate  1 g Oral TID WC & HS   Continuous Infusions:  magnesium sulfate 1 - 4 g bolus IVPB         LOS: 94 days   Charlynne Cousins  [facility]  12/06/2018, 8:41 AM

## 2018-12-06 NOTE — Progress Notes (Signed)
Pt educated on fall risk and plan. Refused utilization of the bed alarm stating it "gets on my nerves and keeps me from moving around". Did attempt to reinforce use of bed alarm for safety but pt continue to refuse. Bed in lowest position with fall mats in place. Call bell in reach and discouraged independent mobility.

## 2018-12-06 NOTE — Progress Notes (Signed)
[facility]  Subjective  - States his right stump feels fine, still tender with dressing changes, but over all ok.   Objective 108/85 96 97.8 F (36.6 C) (Oral) 15 100%  Intake/Output Summary (Last 24 hours) at 12/06/2018 0853 Last data filed at 12/06/2018 0435 Gross per 24 hour  Intake 770 ml  Output 100 ml  Net 670 ml    Right stump appears to be healing well, medial open area packed at bedside with wet guaze. No erythema or drainage, surround skin healthy in appearance.       Assessment/Planning: POD # 17 right BKA  Continue wet to dry packing right stump open wound.  Ryan Medina 12/06/2018 8:53 AM --  Laboratory Lab Results: Recent Labs    12/05/18 0349 12/06/18 0306  WBC 9.4 11.1*  HGB 10.5* 10.8*  HCT 32.3* 33.8*  PLT 269 255   BMET Recent Labs    12/05/18 0349 12/06/18 0306  NA 136 136  K 3.7 4.1  CL 102 104  CO2 27 28  GLUCOSE 306* 49*  BUN 9 11  CREATININE 0.56* 0.44*  CALCIUM 8.3* 8.4*    COAG Lab Results  Component Value Date   INR 1.31 11/02/2018   INR 1.15 10/11/2018   INR 1.23 10/02/2018   No results found for: PTT

## 2018-12-06 NOTE — Progress Notes (Signed)
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 We will check a CBC stat and then repeat every 12 hours, will hold Eliquis for today. I have discussed with the patient the risk and benefits, as he has probably now lower GI bleed, talking with the patient he would like to be DNR/DNI. We will get hospice and palliative care involved to discuss goals of care, the average lifespan of her diagnosed pancreatic cancer from diagnosis is 6 months, he is currently a stage IV so his survivability will probably be less than 6 months conservatively.

## 2018-12-06 NOTE — Progress Notes (Signed)
Dr Aileen Fass at bedside. Assessed patient. New orders received. Patient now NPO.

## 2018-12-06 NOTE — Progress Notes (Signed)
Occupational Therapy Treatment Patient Details Name: Ryan Medina MRN: [id number] DOB: [birth year] Today's Date: 12/06/2018    History of present illness Pt is a 58 y.o. male with DM, HTN, sCHF EF 45% and polysubstance abuse/homelessness. He presented initially with left leg pain on 12/13, found to have acute thromboembolic ischemic left leg.  He was taken emergently for embolectomy.  Post-op course complicated.  Of primary importance, found to have inoperable pancreatic cancer.  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    OT comments  Pt received in bed upon arrival and pleasant. Pt progressing toward OT goals. Pt asked to sit at EOB to engage in LB dressing of don/doff of sock (supervision). Pt educated and engage in UE exercises for increased for UB ADLs and fucntional mobility with use of wc.10 reps. 5 sec hold BUE; shoulder extensiton/flexion and elbow extension/flexion. DC still remains appropriate. OT will continue to follow acutely.    Follow Up Recommendations  SNF    Equipment Recommendations  3 in 1 bedside commode    Recommendations for Other Services PT consult    Precautions / Restrictions Precautions Precautions: Fall Precaution Comments: Pt reports fall overnight 2/28 trying to use urinal. Required Braces or Orthoses: Other Brace Other Brace: CAM boot (doesn't like to wear) Restrictions Weight Bearing Restrictions: Yes RUE Weight Bearing: Weight bearing as tolerated RLE Weight Bearing: Non weight bearing       Mobility Bed Mobility Overal bed mobility: Independent Bed Mobility: Supine to Sit       Sit to supine: Modified independent (Device/Increase time)   General bed mobility comments: pt sitting EOB upon arrival  Transfers Overall transfer level: Modified independent Equipment used: Rolling walker (2 wheeled) Transfers: Stand Pivot Transfers Sit to Stand: Min guard Stand pivot transfers: Min guard       General transfer comment: cues for safety; pt tends to start reaching for seated surface when he is far away from chair, bed, etc...    Balance Overall balance assessment: Needs assistance Sitting-balance support: Feet supported;No upper extremity supported Sitting balance-Leahy Scale: Good     Standing balance support: Bilateral upper extremity supported Standing balance-Leahy Scale: Poor Standing balance comment: reliant on BUE support and RW to maintain standing balance                           ADL either performed or assessed with clinical judgement   ADL Overall ADL's : Needs assistance/impaired Eating/Feeding: Independent;Sitting                   Lower Body Dressing: Sitting/lateral leans;Supervision/safety Lower Body Dressing Details (indicate cue type and reason): sitting up in bed and bringing legs up to him Toilet Transfer: Supervision/safety;Stand-pivot Toilet Transfer Details (indicate cue type and reason): simulated to wc from bed.           General ADL Comments: Pt engaged in UE exercises with theraband for UB ADLs and increased strength for functional mobility.     Vision   Vision Assessment?: No apparent visual deficits   Perception     Praxis      Cognition Arousal/Alertness: Awake/alert Behavior During Therapy: WFL for tasks assessed/performed Overall Cognitive Status: History of cognitive impairments - at baseline Area of Impairment: Safety/judgement                         Safety/Judgement: Decreased awareness of deficits;Decreased awareness of  safety     General Comments: fluctuating behavior at times, very pleasant on arrival.        Exercises Exercises: Amputee General Exercises - Upper Extremity Shoulder Flexion: 10 reps;Both;15 reps;Seated;Theraband Theraband Level (Shoulder Flexion): Level 3 (Green) Shoulder Extension: AROM;Both;10 reps;Seated;Theraband Theraband Level (Shoulder Extension): Level 3  (Green) Elbow Flexion: AROM;Both;10 reps;Seated;Theraband Theraband Level (Elbow Flexion): Level 3 (Green) Elbow Extension: AROM;Both;10 reps;Seated;Theraband Theraband Level (Elbow Extension): Level 3 (Green)   Shoulder Instructions       General Comments      Pertinent Vitals/ Pain       Pain Assessment: 0-10 Faces Pain Scale: No hurt Pain Location: R residual limb Pain Descriptors / Indicators: Sore Pain Intervention(s): Monitored during session  Home Living                                          Prior Functioning/Environment              Frequency  Min 2X/week        Progress Toward Goals  OT Goals(current goals can now be found in the care plan section)  Progress towards OT goals: Progressing toward goals  Acute Rehab OT Goals Patient Stated Goal: "I want a wheelchair" OT Goal Formulation: With patient Time For Goal Achievement: 12/04/18 Potential to Achieve Goals: Good ADL Goals Pt Will Perform Grooming: with supervision;with set-up;standing Pt Will Perform Lower Body Dressing: with set-up;with supervision;sit to/from stand Pt Will Transfer to Toilet: with supervision;ambulating;bedside commode Pt Will Perform Toileting - Clothing Manipulation and hygiene: with supervision;sit to/from stand Pt/caregiver will Perform Home Exercise Program: Increased strength;Both right and left upper extremity;With written HEP provided Additional ADL Goal #1: pt will perform ADL functional transfers with modified independence.  Plan Discharge plan remains appropriate;Frequency remains appropriate    Co-evaluation    PT/OT/SLP Co-Evaluation/Treatment: Yes            AM-PAC OT "6 Clicks" Daily Activity     Outcome Measure   Help from another person eating meals?: None Help from another person taking care of personal grooming?: A Little Help from another person toileting, which includes using toliet, bedpan, or urinal?: A Little Help from  another person bathing (including washing, rinsing, drying)?: A Little Help from another person to put on and taking off regular upper body clothing?: None Help from another person to put on and taking off regular lower body clothing?: A Little 6 Click Score: 20    End of Session Equipment Utilized During Treatment: Rolling walker;Gait belt;Other (comment)(BSC)  OT Visit Diagnosis: Unsteadiness on feet (R26.81);Muscle weakness (generalized) (M62.81) Pain - Right/Left: Right Pain - part of body: Leg   Activity Tolerance Patient tolerated treatment well   Patient Left with nursing/sitter in room(in wc)   Nurse Communication Mobility status;Precautions        Time: 3474-2595 OT Time Calculation (min): 14 min  Charges: OT General Charges $OT Visit: 1 Visit OT Treatments $Self Care/Home Management : 8-22 mins  Minus Breeding, MSOT, OTR/L  Supplemental Rehabilitation Services  [phone]  Marius Ditch 12/06/2018, 2:47 PM

## 2018-12-06 NOTE — Progress Notes (Signed)
Inpatient Diabetes Program Recommendations  AACE/ADA: New Consensus Statement on Inpatient Glycemic Control  Target Ranges:  Prepandial:   less than 140 mg/dL      Peak postprandial:   less than 180 mg/dL (1-2 hours)      Critically ill patients:  140 - 180 mg/dL  Results for Ryan Medina, Ryan Medina (MRN [id number]) as of 12/06/2018 09:54  Ref. Range 12/06/2018 03:06  Glucose Latest Ref Range: 70 - 99 mg/dL 49 (L)   Results for Ryan Medina, Ryan Medina (MRN [id number]) as of 12/06/2018 09:54  Ref. Range 12/05/2018 08:31 12/05/2018 12:05 12/05/2018 16:27 12/05/2018 18:32 12/05/2018 21:03 12/06/2018 08:23 12/06/2018 09:32  Glucose-Capillary Latest Ref Range: 70 - 99 mg/dL 127 (H) 253 (H) 65 (L) 97 195 (H) 91 113 (H)   Review of Glycemic Control  Current orders for Inpatient glycemic control: Levemir 5 units BID, Novolog 0-9 units TID with meals, Novolog 0-5 units QHS  Inpatient Diabetes Program Recommendations:   Insulin - Basal:Lab glucose 49 mg/dl at 3:06 am today and finger stick 91 mg/dl at 8:23 today.  Please consider decreasing Lantus to 5 units daily.  Insulin - Meal Coverage: If Lantus is decreased as noted and post prandial glucose is consistently elevated, may want to consider ordering Novolog 2 units TID with meals for meal coverage if patient eats at least 50% of meals.  Thanks, Barnie Alderman, RN, MSN, CDE Diabetes Coordinator Inpatient Diabetes Program [phone] (Team Pager from 8am to 5pm)

## 2018-12-06 NOTE — Progress Notes (Addendum)
Patient had bloody BM. Complaining of "stomach pain". Paged Dr Olevia Bowens to notify. Assessed patient for external hemorrhoids. None seen. Patient did state that he strained while having BM.

## 2018-12-06 NOTE — Progress Notes (Signed)
Physical Therapy Treatment Patient Details Name: Ryan Medina MRN: [id number] DOB: 1961/03/23 Today's Date: 12/06/2018    History of Present Illness Pt is a 58 y.o. male with DM, HTN, sCHF EF 45% and polysubstance abuse/homelessness. He presented initially with left leg pain on 12/13, found to have acute thromboembolic ischemic left leg.  He was taken emergently for embolectomy.  Post-op course complicated.  Of primary importance, found to have inoperable pancreatic cancer.  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     PT Comments    Patient seen for mobility progression. Pt continues to make progress toward PT goals. Pt is able to ambulate short distances and self propel w/c this session. Continue to progress as tolerated.    Follow Up Recommendations  SNF     Equipment Recommendations  Other (comment)(TBD at next venue)    Recommendations for Other Services       Precautions / Restrictions Precautions Precautions: Fall Restrictions Weight Bearing Restrictions: Yes RLE Weight Bearing: Non weight bearing    Mobility  Bed Mobility Overal bed mobility: Independent                Transfers Overall transfer level: Modified independent Equipment used: Rolling walker (2 wheeled) Transfers: Sit to/from [employer] transfer comment: cues for safety; pt tends to start reaching for seated surface when he is far away from chair, bed, etc...  Ambulation/Gait Ambulation/Gait assistance: Supervision Gait Distance (Feet): (~20 ft total) Assistive device: Rolling walker (2 wheeled) Gait Pattern/deviations: Step-to pattern Gait velocity: decreased   General Gait Details: cues for upright posture and maintaining R LE extension in standing    Theme park manager mobility: Yes Wheelchair propulsion: Both upper extremities Wheelchair  parts: Supervision/cueing Distance: 400 Wheelchair Assistance Details (indicate cue type and reason): pt educated on w/c parts and practiced with brakes and leg rests; vc for more efficient turning/directional changes/navigating small space  Modified Rankin (Stroke Patients Only)       Balance Overall balance assessment: Needs assistance Sitting-balance support: Feet supported;No upper extremity supported Sitting balance-Leahy Scale: Good     Standing balance support: Bilateral upper extremity supported Standing balance-Leahy Scale: Poor Standing balance comment: reliant on BUE support and RW to maintain standing balance                            Cognition Arousal/Alertness: Awake/alert Behavior During Therapy: WFL for tasks assessed/performed Overall Cognitive Status: History of cognitive impairments - at baseline Area of Impairment: Safety/judgement                         Safety/Judgement: Decreased awareness of deficits;Decreased awareness of safety            Exercises      General Comments        Pertinent Vitals/Pain Pain Assessment: Faces Faces Pain Scale: No hurt    Home Living                      Prior Function            PT Goals (current goals can now be found in the care plan section) Progress towards PT goals: Progressing toward goals    Frequency    Min  3X/week      PT Plan Current plan remains appropriate    Co-evaluation              AM-PAC PT "6 Clicks" Mobility   Outcome Measure  Help needed turning from your back to your side while in a flat bed without using bedrails?: None Help needed moving from lying on your back to sitting on the side of a flat bed without using bedrails?: None Help needed moving to and from a bed to a chair (including a wheelchair)?: None Help needed standing up from a chair using your arms (e.g., wheelchair or bedside chair)?: None Help needed to walk in hospital  room?: A Little Help needed climbing 3-5 steps with a railing? : A Lot 6 Click Score: 21    End of Session Equipment Utilized During Treatment: Gait belt Activity Tolerance: Patient tolerated treatment well Patient left: with call bell/phone within reach;in bed Nurse Communication: Mobility status PT Visit Diagnosis: Other abnormalities of gait and mobility (R26.89);Difficulty in walking, not elsewhere classified (R26.2);Pain;Muscle weakness (generalized) (M62.81) Pain - Right/Left: Right Pain - part of body: Leg     Time: 1310-1335 PT Time Calculation (min) (ACUTE ONLY): 25 min  Charges:  $Gait Training: 8-22 mins $Wheel Chair Management: 8-22 mins                     Ryan Medina, PTA Acute Rehabilitation Services Pager: [phone] Office: [phone]     Darliss Cheney 12/06/2018, 1:46 PM

## 2018-12-07 DIAGNOSIS — C787 Secondary malignant neoplasm of liver and intrahepatic bile duct: Secondary | ICD-10-CM

## 2018-12-07 DIAGNOSIS — I81 Portal vein thrombosis: Secondary | ICD-10-CM | POA: Diagnosis not present

## 2018-12-07 DIAGNOSIS — Z66 Do not resuscitate: Secondary | ICD-10-CM

## 2018-12-07 LAB — GLUCOSE, CAPILLARY
Glucose-Capillary: 66 mg/dL — ABNORMAL LOW (ref 70–99)
Glucose-Capillary: 175 mg/dL — ABNORMAL HIGH (ref 70–99)
Glucose-Capillary: 132 mg/dL — ABNORMAL HIGH (ref 70–99)
Glucose-Capillary: 199 mg/dL — ABNORMAL HIGH (ref 70–99)

## 2018-12-07 LAB — CBC
HCT: 32.2 % — ABNORMAL LOW (ref 39.0–52.0)
HEMATOCRIT: 29 % — AB (ref 39.0–52.0)
Hemoglobin: 9.1 g/dL — ABNORMAL LOW (ref 13.0–17.0)
Hemoglobin: 10.6 g/dL — ABNORMAL LOW (ref 13.0–17.0)
MCH: 28 pg (ref 26.0–34.0)
MCH: 29.7 pg (ref 26.0–34.0)
MCHC: 31.4 g/dL (ref 30.0–36.0)
MCHC: 32.9 g/dL (ref 30.0–36.0)
MCV: 89.2 fL (ref 80.0–100.0)
MCV: 90.2 fL (ref 80.0–100.0)
Platelets: 201 10*3/uL (ref 150–400)
Platelets: 192 10*3/uL (ref 150–400)
RBC: 3.25 MIL/uL — ABNORMAL LOW (ref 4.22–5.81)
RBC: 3.57 MIL/uL — ABNORMAL LOW (ref 4.22–5.81)
RDW: 13.6 % (ref 11.5–15.5)
RDW: 14.2 % (ref 11.5–15.5)
WBC: 7.8 10*3/uL (ref 4.0–10.5)
WBC: 9.4 10*3/uL (ref 4.0–10.5)
nRBC: 0 % (ref 0.0–0.2)
nRBC: 0 % (ref 0.0–0.2)

## 2018-12-07 MED ORDER — INSULIN DETEMIR 100 UNIT/ML ~~LOC~~ SOLN
5.0000 [IU] | Freq: Every day | SUBCUTANEOUS | Status: DC
  Administered 2018-12-07 – 2018-12-15 (×8): 5 [IU] via SUBCUTANEOUS
  Filled 2018-12-07 (×9): qty 0.05

## 2018-12-07 MED ORDER — ENOXAPARIN SODIUM 100 MG/ML ~~LOC~~ SOLN
1.5000 mg/kg | SUBCUTANEOUS | Status: DC
  Administered 2018-12-08 – 2018-12-15 (×8): 85 mg via SUBCUTANEOUS
  Filled 2018-12-07 (×8): qty 0.85

## 2018-12-07 MED ORDER — ADULT MULTIVITAMIN W/MINERALS CH
1.0000 | ORAL_TABLET | Freq: Every day | ORAL | Status: DC
  Administered 2018-12-07 – 2018-12-15 (×9): 1 via ORAL
  Filled 2018-12-07 (×8): qty 1

## 2018-12-07 MED ORDER — ALBUTEROL SULFATE (2.5 MG/3ML) 0.083% IN NEBU
3.0000 mL | INHALATION_SOLUTION | RESPIRATORY_TRACT | Status: DC | PRN
  Filled 2018-12-07: qty 3

## 2018-12-07 MED ORDER — ENOXAPARIN SODIUM 60 MG/0.6ML ~~LOC~~ SOLN
1.0000 mg/kg | Freq: Two times a day (BID) | SUBCUTANEOUS | Status: DC
  Administered 2018-12-07: 55 mg via SUBCUTANEOUS
  Filled 2018-12-07: qty 0.6

## 2018-12-07 NOTE — Progress Notes (Signed)
Physical Therapy Treatment Patient Details Name: Ryan Medina MRN: [id number] DOB: 01/05/1961 Today's Date: 12/07/2018    History of Present Illness Pt is a 58 y.o. male with DM, HTN, sCHF EF 45% and polysubstance abuse/homelessness. He presented initially with left leg pain on 12/13, found to have acute thromboembolic ischemic left leg.  He was taken emergently for embolectomy.  Post-op course complicated.  Of primary importance, found to have inoperable pancreatic cancer.  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     PT Comments    Patient seen for mobility progression. Pt demonstrates carry over of managing w/c parts and safety. Continue to progress as tolerated.    Follow Up Recommendations  SNF     Equipment Recommendations  Wheelchair (measurements PT);Wheelchair cushion (measurements PT);Other (comment)(w/c limb support)    Recommendations for Other Services       Precautions / Restrictions Precautions Precautions: Fall Precaution Comments: Pt reports fall overnight 2/28 trying to use urinal. Required Braces or Orthoses: Other Brace Other Brace: CAM boot (doesn't like to wear) Restrictions Weight Bearing Restrictions: Yes RUE Weight Bearing: Weight bearing as tolerated RLE Weight Bearing: Non weight bearing    Mobility  Bed Mobility Overal bed mobility: Independent                Transfers Overall transfer level: Modified independent Equipment used: Rolling walker (2 wheeled) Transfers: Squat Pivot Transfers   Stand pivot transfers: Modified independent (Device/Increase time)       General transfer comment: pt able to pivot to w/c from EOB   Ambulation/Gait             General Gait Details: pt declined ambulating at this time   Theme park manager mobility: Yes Wheelchair propulsion: Both upper extremities Wheelchair parts:  Independent Distance: 500 Wheelchair Assistance Details (indicate cue type and reason): pt demonstrates carry over of w/c parts and safety   Modified Rankin (Stroke Patients Only)       Balance Overall balance assessment: Needs assistance Sitting-balance support: Feet supported;No upper extremity supported Sitting balance-Leahy Scale: Good     Standing balance support: Bilateral upper extremity supported Standing balance-Leahy Scale: Poor Standing balance comment: reliant on BUE support and RW to maintain standing balance                            Cognition Arousal/Alertness: Awake/alert Behavior During Therapy: WFL for tasks assessed/performed Overall Cognitive Status: History of cognitive impairments - at baseline Area of Impairment: Safety/judgement                         Safety/Judgement: Decreased awareness of deficits;Decreased awareness of safety     General Comments: fluctuating behavior at times, very pleasant on arrival.      Exercises      General Comments        Pertinent Vitals/Pain Pain Assessment: Faces Faces Pain Scale: Hurts a little bit Pain Location: R residual limb Pain Descriptors / Indicators: Sore Pain Intervention(s): Monitored during session    Home Living                      Prior Function            PT Goals (current goals can now be found in the care plan section) Acute  Rehab PT Goals Patient Stated Goal: "I want a wheelchair" Progress towards PT goals: Progressing toward goals    Frequency    Min 3X/week      PT Plan Current plan remains appropriate    Co-evaluation              AM-PAC PT "6 Clicks" Mobility   Outcome Measure  Help needed turning from your back to your side while in a flat bed without using bedrails?: None Help needed moving from lying on your back to sitting on the side of a flat bed without using bedrails?: None Help needed moving to and from a bed to a  chair (including a wheelchair)?: None Help needed standing up from a chair using your arms (e.g., wheelchair or bedside chair)?: None Help needed to walk in hospital room?: A Little Help needed climbing 3-5 steps with a railing? : A Lot 6 Click Score: 21    End of Session   Activity Tolerance: Patient tolerated treatment well Patient left: Other (comment)(pt with OT end of session) Nurse Communication: Mobility status PT Visit Diagnosis: Other abnormalities of gait and mobility (R26.89);Difficulty in walking, not elsewhere classified (R26.2);Pain;Muscle weakness (generalized) (M62.81) Pain - Right/Left: Right Pain - part of body: Leg     Time: 7673-4193 PT Time Calculation (min) (ACUTE ONLY): 13 min  Charges:  $Wheel Chair Management: 8-22 mins                     Ryan Medina, Ryan Medina Acute Rehabilitation Services Pager: [phone] Office: [phone]     Ryan Medina 12/07/2018, 5:08 PM

## 2018-12-07 NOTE — Plan of Care (Signed)
  Problem: Education: Goal: Knowledge of General Education information will improve Description Including pain rating scale, medication(s)/side effects and non-pharmacologic comfort measures  Outcome: Progressing   Problem: Clinical Measurements: Goal: Postoperative complications will be avoided or minimized Outcome: Progressing   Problem: Metabolic: Goal: Ability to maintain appropriate glucose levels will improve Outcome: Progressing   Problem: Tissue Perfusion: Goal: Adequacy of tissue perfusion will improve Outcome: Progressing

## 2018-12-07 NOTE — Progress Notes (Signed)
Pt stated that his dressing was falling off. Asked for his treatment to be done early. Old dressing removed. Pt continues to have some sharp pains during dressing changes. While cleaning, I noted several chunks of tissue or blood clots, all was removed, wound care was completed per MD order. Continue to monitor.

## 2018-12-07 NOTE — Progress Notes (Signed)
Occupational Therapy Treatment Patient Details Name: Ryan Medina MRN: [id number] DOB: [birth year] Today's Date: 12/07/2018    History of present illness Pt is a 58 y.o. male with DM, HTN, sCHF EF 45% and polysubstance abuse/homelessness. He presented initially with left leg pain on 12/13, found to have acute thromboembolic ischemic left leg.  He was taken emergently for embolectomy.  Post-op course complicated.  Of primary importance, found to have inoperable pancreatic cancer.  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    OT comments  Pt assessed to meet all OT goals. Session will be reduced down to 1x/week to focus on continued UE strength for functional transfers and ADLs. Pt reports physician stating that pt has 6 weeks to live. DC recommendation has been to hospice, however, pt not receptive of recommendation. Pt would benefit from continued skilled OT to address strength and engagement in activities. OT will continue to follow acutely.   Follow Up Recommendations  SNF    Equipment Recommendations  3 in 1 bedside commode;Wheelchair (measurements OT)    Recommendations for Other Services PT consult    Precautions / Restrictions Precautions Precautions: Fall Precaution Comments: Pt reports fall overnight 2/28 trying to use urinal. Required Braces or Orthoses: Other Brace Other Brace: CAM boot (doesn't like to wear) Restrictions Weight Bearing Restrictions: No RUE Weight Bearing: Weight bearing as tolerated RLE Weight Bearing: Non weight bearing       Mobility Bed Mobility                  Transfers Overall transfer level: Modified independent Equipment used: Rolling walker (2 wheeled) Transfers: Stand Pivot Transfers   Stand pivot transfers: Modified independent (Device/Increase time)       General transfer comment: cues for safety; pt tends to start reaching for seated surface when he is far away from chair, bed,  etc...    Balance Overall balance assessment: Needs assistance Sitting-balance support: Feet supported;No upper extremity supported Sitting balance-Leahy Scale: Good     Standing balance support: Bilateral upper extremity supported Standing balance-Leahy Scale: Poor Standing balance comment: reliant on BUE support and RW to maintain standing balance                           ADL either performed or assessed with clinical judgement   ADL Overall ADL's : Needs assistance/impaired                         Toilet Transfer: Stand-pivot;Modified Independent Toilet Transfer Details (indicate cue type and reason): simulated to wc from bed.         Functional mobility during ADLs: Rolling walker;Min guard;Wheelchair General ADL Comments: Simulated toilet transfer. Pt observed to transfer from [facility] to bed with stand pivot  approach with WC positioned beside bed. Pt will benefit from continued strengthing of BUE to increase strength for functional mobilty.       Vision   Vision Assessment?: No apparent visual deficits   Perception     Praxis      Cognition Arousal/Alertness: Awake/alert Behavior During Therapy: WFL for tasks assessed/performed Overall Cognitive Status: History of cognitive impairments - at baseline Area of Impairment: Safety/judgement                         Safety/Judgement: Decreased awareness of deficits;Decreased awareness of safety     General Comments: fluctuating behavior  at times, very pleasant on arrival.        Exercises Exercises: Amputee   Shoulder Instructions       General Comments      Pertinent Vitals/ Pain       Pain Assessment: 0-10 Faces Pain Scale: No hurt Pain Location: R residual limb Pain Descriptors / Indicators: Sore Pain Intervention(s): Monitored during session  Home Living                                          Prior Functioning/Environment               Frequency  Min 1X/week(For UE strength )        Progress Toward Goals  OT Goals(current goals can now be found in the care plan section)  Progress towards OT goals: Progressing toward goals  Acute Rehab OT Goals Patient Stated Goal: "I want a wheelchair" OT Goal Formulation: With patient Time For Goal Achievement: 12/04/18 Potential to Achieve Goals: Good ADL Goals Pt Will Perform Grooming: with supervision;with set-up;standing Pt Will Perform Lower Body Dressing: with set-up;with supervision;sit to/from stand Pt Will Transfer to Toilet: with supervision;ambulating;bedside commode Pt Will Perform Toileting - Clothing Manipulation and hygiene: with supervision;sit to/from stand Pt/caregiver will Perform Home Exercise Program: Increased strength;Both right and left upper extremity;With written HEP provided Additional ADL Goal #1: pt will perform ADL functional transfers with modified independence.  Plan Discharge plan remains appropriate;Frequency needs to be updated    Co-evaluation    PT/OT/SLP Co-Evaluation/Treatment: Yes            AM-PAC OT "6 Clicks" Daily Activity     Outcome Measure   Help from another person eating meals?: None Help from another person taking care of personal grooming?: A Little Help from another person toileting, which includes using toliet, bedpan, or urinal?: A Little Help from another person bathing (including washing, rinsing, drying)?: A Little Help from another person to put on and taking off regular upper body clothing?: None Help from another person to put on and taking off regular lower body clothing?: A Little 6 Click Score: 20    End of Session Equipment Utilized During Treatment: Other (comment)(WC)  OT Visit Diagnosis: Unsteadiness on feet (R26.81);Muscle weakness (generalized) (M62.81) Pain - Right/Left: Right Pain - part of body: Leg   Activity Tolerance Patient tolerated treatment well   Patient Left in bed;with call  bell/phone within reach(in wc)   Nurse Communication Mobility status;Precautions        Time: 6606-3016 OT Time Calculation (min): 10 min  Charges: OT General Charges $OT Visit: 1 Visit OT Treatments $Self Care/Home Management : 8-22 mins  Minus Breeding, MSOT, OTR/L  Supplemental Rehabilitation Services  [phone]  Marius Ditch 12/07/2018, 4:33 PM

## 2018-12-07 NOTE — Progress Notes (Signed)
I apologize for not seen [name] sooner.  He was taken off my rounding list last week and I thought that he had been discharged.  I know that the doctors were working hard to discharge him.  He does look all that much different than when I last saw him.  He said he had an episode of bright red blood per rectum which was limited.  He said this was because he was straining.  He had a CT scan done yesterday.  There were new small lesions in the liver.  The pancreas was without change.  He has decided to forego any chemotherapy.  He has decided on a DNR status.  I can totally understand this.  We really have tried hard to try to treat his malignancy.  Our best chance was probably a couple months ago but he had issues and needed to have surgery because of obstruction.  He had a heal up from surgery.  He then has begun a slow but steady decline.  His performance status is probably ECOG 3.  He had been on Eliquis just because it was easy to give him.  Again, I really do not think this blood per rectum was because of Eliquis.  He was changed to Lovenox.  I know that hospice will not pay for Lovenox if he were to be discharged to hospice.  The real problem has been the fact that he has nowhere to stay.  I will think anyone is going to take him.  He has been how long I thought he had to live.  I told him that I would be surprised if he made it to [facility] in May.  Thankfully, he is really not hurting.  The pain in his right lower leg where he had the right BKA is better.  He is still eating.,  Sure he is really gaining any weight.  His lab work today, shows a white count of 7.8.  Hemoglobin 9.1.  Platelet count 201K.  His blood sugar was only 49 this morning.  His creatinine was 0.44.  There really is no change overall in his physical exam.  Again, the problem is that he has no where to go.  This is been the issue from day 1 which is probably 4 months ago.  I think that is pancreatic cancer will  obviously continue to grow.  The fact that he can still eat is a plus.  I suppose that he might run into problems with bleeding if this arose into his intestine.  I think another option to consider is just to stop the anticoagulation completely.  The fact that he is a DNR, he has a incurable disease, possibly, stop his anticoagulation will actually make him better with a better quality of life.  I suppose this would be something to think about.  I am glad that I was able to see him again.  Again, I thought that he was discharged from the hospital as he fell off my rounding list 1 day.  I am going out of town for the next 3 days.  I will be back on Monday, March 23.  [name], MD  Romans 8:28

## 2018-12-07 NOTE — Progress Notes (Addendum)
[facility] PROGRESS NOTE    Progress Note  Aleksey Newbern Swor  EHM:[id number] DOB: 1961/03/03 DOA: 09/03/2018 PCP: Patient, No Pcp Per     Brief Narrative:   Lattie Cervi Vickerman is an 58 y.o. male past medical history of diabetes mellitus, hypertension, systolic heart failure with an EF of 45%, polysubstance abuse who presented on 09/03/2018 with right leg pain was found to have an acute thromboembolic ischemic event taken emergent leading to embolectomy  Major Hospital events: - 09/03/2018 Right popliteal and tibial embolectomy due to ischemic right lower extremity. - 09/05/2018 Evacuation of hematoma and fasciotomy of the right leg due to bleeding from right leg wound. - 09/07/2018 EGD showing active bleeding ulcer. - 09/07/2018 Decompensated and intubated due to a duodenal ulcer with hemorrhage IR performed coiling. -  09/10/2018 Recurrent right lower extremity embolism requiring repeated leg embolectomy. -09/11/2018 CTA showed pancreatic mass with mural thrombus and dilated bilateral iliac arteries. - EUS biopsy of pancreatic cancer 09/16/2018. -  09/12/2019 Diagnosed with locally advanced pancreatic cancer incidentally. -Transferred to [facility] hospital for radiation from 09/30/2020 10/13/2018 Port-A-Cath placed on 10/12/2018 for chemotherapy (has not been started pending stabilization) - PE on 08/27/2019 anticoagulation restarted. - 10/29/2018 repeated EGD for follow-up ulcer showed malignant pyloric stenosis and candidiasis - 11/04/2018 open gastrojejunostomy was performed due to malignant pyloric stenosis. -09/12/2019 EGD repeated for hematochezia, no source of bleeding. 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 Patient needs oxycodone for pain.  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 Currently n.p.o.  Acute blood loss anemia on anemia of chronic disease/hematochezia/malignant duodenal ulcer: On a PPI and sucralfate, he has received several doses of Feraheme, currently on oral iron.  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  Candida esophagitis: He has completed treatment.  Acute Pulmonary embolism/new portal vein thrombosis on 12/07/2018: The case was discussed with hematology oncology and the patient and patient refused Lovenox, the patient opted for Eliquis which is sub-optimal alternative. Discussed the case with oncology and they recommended to restart Lovenox, they will come and reevaluate the patient.  Chronic systolic and diastolic heart failure: Stable no signs of overload.  Sepsis from healthcare associated pneumonia/Pseudomonas UTI: He has completed treatment with IV cefepime.  DM (diabetes mellitus), type 2, uncontrolled (HCC) Cont. long-acting insulin plus sliding scale insulin.  New lower GI bleed/abdominal pain: CT scan showed no acute findings, like bowel obstruction, it showed stable pancreatic mass to small densities in the liver, I did show possible thrombosis of the inferior vena cava which may extend to the iliacs and femoral. Hemoglobin today is 9. His abdominal pain is resolved, his Eliquis was held and he had 2 bloody bowel movements on 3.16.2020. He has had no further bowel movements overnight.  We will reconsult hematology oncology as he  has not possible thrombosis of the inferior vena cava extending into the iliac and femoral while on Eliquis.  Now that he  is bleeding a complicates the situation due to his poor prognoses and his new lower GI bleed it makes it difficult to start anticoagulation. We will have to re-consult hospice and palliative care as he is now decided to move to DNR/DNI I have explained to him his lifespan will lerss than 4 months and we will probably have to readdress end-of-life and prognoses with him. Discussed the case with oncology and they recommended to start Lovenox and monitor him.  Disposition: PT OT evaluated the patient and recommended skilled nursing facility for his BKA as well as his malnutrition. The patient has been in the hospital more than 3 days. Needs rehabilitation at least 5 days a week to facilitate daily activities and transition to home. Rehab is being requested as the most appropriate discharge option for the patient is not felt to be custodial care as evidenced by the patient's inability to perform ADLs and ambulate.    RN Pressure Injury Documentation:    Estimated body mass index is 17.28 kg/m as calculated from the following:   Height as of this encounter: 5\' 11"  (1.803 m).   Weight as of this encounter: 56.2 kg. Malnutrition Type:  Nutrition Problem: Severe Malnutrition Etiology: chronic illness(Pancreatic cancer, DM and CHF)   Malnutrition Characteristics:  Signs/Symptoms: severe muscle depletion, severe fat depletion, percent weight loss Percent weight loss: 23 %   Nutrition Interventions:  Interventions: Ensure Enlive (each supplement provides 350kcal and 20 grams of protein), Magic cup    DVT prophylaxis: Eliquis Family Communication:none Disposition Plan/Barrier to D/C: SNF when bed available Code Status:     Code Status Orders  (From admission, onward)         Start     Ordered   09/07/18 1850  Limited resuscitation (code)  Continuous     Question Answer Comment  In the event of cardiac or respiratory ARREST: Initiate Code Blue, Call Rapid Response No   In the event of cardiac or respiratory ARREST: Perform CPR No   In the event of cardiac or respiratory ARREST: Perform Intubation/Mechanical Ventilation Yes   In the event of cardiac or respiratory ARREST: Use NIPPV/BiPAp only if indicated No   In the event of cardiac or respiratory ARREST: Administer ACLS medications if indicated No   In the event of cardiac or respiratory ARREST: Perform Defibrillation or Cardioversion if indicated No   Comments Continue current care with mechanical ventilation and vasopressor support.      09/07/18 1850        Code Status History    Date Active Date Inactive Code Status Order ID Comments User Context   09/03/2018 2251 09/07/2018 1850 Full Code [id number]  [name], MD Inpatient   09/03/2018 2236 09/03/2018 2251 Full Code [id number]  Iline Oven Inpatient   08/22/2018 2203 08/25/2018 1655 Full Code [id number]  Ivor Costa, MD ED   01/30/2018 0308 01/31/2018 1807 Full Code [id number]  [name], MD ED   05/19/2016 1029 05/19/2016 1721 Full Code [id number]  Leonie Man, MD Inpatient        IV Access:    Peripheral IV   Procedures and diagnostic studies:   Ct Abdomen Pelvis W Contrast  Result Date: 12/07/2018 CLINICAL DATA:  Recently diagnosed locally advanced stage IV adenocarcinoma of the pancreas nonoperative, he has not started chemotherapy, with a right leg gangrene due to popliteal artery embolus, 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 Epigastric pain/bloody stools. EXAM: CT ABDOMEN AND PELVIS WITH CONTRAST TECHNIQUE:  Multidetector CT imaging of the abdomen and pelvis was performed using the standard protocol following bolus administration of intravenous contrast. CONTRAST:  170mL OMNIPAQUE IOHEXOL 300 MG/ML  SOLN COMPARISON:  11/25/2018 and older exams. FINDINGS: Lower chest:  Opacity consistent with atelectasis noted at the left lateral lung base, stable from the prior exam. Lung bases otherwise clear. Hepatobiliary: Subtle liver lesions. 7 mm hypoattenuating lesion, right lobe segment 6. Similar-sized similar appearing lesion segment 4 B. tiny low-density lesion lateral right lobe, likely a cyst. 1 cm probable cyst, segment 2. No other discrete liver abnormalities. Normal gallbladder. No intrahepatic bile duct dilation. More distal common bile duct is not well visualized. There is metallic density projecting along the anterior margin of the pancreas which may reflect a vascular embolization coil. It is stable. Pancreas: Not well-defined. Heterogeneous appearance of the tail consistent with the known mass, is without significant change. Spleen: Normal in size without focal abnormality. Adrenals/Urinary Tract: No adrenal masses. Kidneys normal in size, orientation and position with symmetric enhancement and excretion. Tiny low-density lesion in the lower pole the right kidney consistent with a cyst. No stones. No hydronephrosis. Ureters are not well visualized due to increased attenuation of the retroperitoneal fat as well as the paucity of fat. No evidence of ureteral dilation or of a ureteral stone. Bladder is unremarkable. Stomach/Bowel: Stomach mildly distended. Folds appear prominent. The duodenum is not well-defined. Small bowel is normal in caliber.  No convincing wall thickening. Mild increased stool burden in the colon. No colonic dilation. Colonic wall is not well-defined, but there is no convincing wall thickening or inflammation. Vascular/Lymphatic: 2.9 cm abdominal aortic aneurysm. Aortic atherosclerosis. 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 Significant internal iliac atherosclerosis. Limited enhancement of the inferior vena cava below the level of the left renal artery. Could not exclude inferior vena cava  thrombosis. Reproductive: Unremarkable. Other: Generalized increased attenuation of fat throughout the peritoneal cavity. Questionable small serosal nodules over the dome of the liver. Musculoskeletal: No fracture or acute finding. No osteoblastic or osteolytic lesions. IMPRESSION: 1. Study is limited due to the relative increased attenuation of the peritoneal fat limiting contrast between the peritoneal fat and adjacent structures. 2. No convincing acute finding.  No bowel obstruction. 3. Stable appearance of the pancreatic mass, poorly defined on the current exam. 4. Two small low-density liver lesions are noted suspicious for metastatic disease. 5. Possible thrombosis of the inferior vena cava, which may extend into the iliac and femoral veins. 6. Stable aortic and iliac artery aneurysms. Electronically Signed   By: Lajean Manes M.D.   On: 12/07/2018 00:50     Medical Consultants:    None.  Anti-Infectives:   None  Subjective:    Nicola Police Emminger he relates no new complaint is tolerating his diet his pain is well controlled.  Objective:    Vitals:   12/06/18 0358 12/06/18 1408 12/06/18 1955 12/07/18 0401  BP: 108/85 110/73 (!) 121/94 (!) 122/97  Pulse: 96 (!) 55 99 90  Resp: 15 16 14 14   Temp: 97.8 F (36.6 C) 97.8 F (36.6 C) 97.9 F (36.6 C) 97.9 F (36.6 C)  TempSrc: Oral Oral Oral Oral  SpO2: 100% 100% 100% 100%  Weight:      Height:        Intake/Output Summary (Last 24 hours) at 12/07/2018 0842 Last data filed at 12/07/2018 0400 Gross per 24 hour  Intake 540 ml  Output 500 ml  Net 40 ml   Filed Weights   11/02/18 1806 11/04/18 0831 11/12/18 0742  Weight: 56.2 kg 56.2 kg 56.2 kg    Exam: General exam: In no acute distress. Respiratory system: Good air movement and clear to auscultation. Cardiovascular system: S1 & S2 heard, RRR.  Gastrointestinal system: Abdomen is nondistended, soft and nontender.  Central nervous system: Alert and oriented. No focal  neurological deficits. 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 Skin: No rashes, lesions or ulcers Psychiatry: Judgement and insight appear normal. Mood & affect appropriate.    Data Reviewed:    Labs: Basic Metabolic Panel: Recent Labs  Lab 12/02/18 0430 12/03/18 0403 12/04/18 1830 12/05/18 0349 12/06/18 0306  NA 136  --  138 136 136  K 3.4*  --  2.7* 3.7 4.1  CL 102  --  104 102 104  CO2 27  --  26 27 28   GLUCOSE 207*  --  179* 306* 49*  BUN 11  --  8 9 11   CREATININE 0.52* 0.56* 0.57* 0.56* 0.44*  CALCIUM 8.5*  --  8.2* 8.3* 8.4*   GFR Estimated Creatinine Clearance: 81 mL/min (A) (by C-G formula based on SCr of 0.44 mg/dL (L)). Liver Function Tests: Recent Labs  Lab 12/02/18 0430  AST 42*  ALT 60*  ALKPHOS 117  BILITOT 0.4  PROT 6.6  ALBUMIN 2.0*   No results for input(s): LIPASE, AMYLASE in the last 168 hours. No results for input(s): AMMONIA in the last 168 hours. Coagulation profile No results for input(s): INR, PROTIME in the last 168 hours.  CBC: Recent Labs  Lab 12/04/18 0353 12/05/18 0349 12/06/18 0306 12/06/18 1821 12/07/18 0453  WBC 10.2 9.4 11.1* 9.4 7.8  NEUTROABS  --   --   --  7.6  --   HGB 10.2* 10.5* 10.8* 10.5* 9.1*  HCT 30.7* 32.3* 33.8* 33.1* 29.0*  MCV 89.8 91.2 90.1 89.9 89.2  PLT 268 269 255 239 201   Cardiac Enzymes: No results for input(s): CKTOTAL, CKMB, CKMBINDEX, TROPONINI in the last 168 hours. BNP (last 3 results) No results for input(s): PROBNP in the last 8760 hours. CBG: Recent Labs  Lab 12/06/18 0932 12/06/18 1133 12/06/18 1633 12/06/18 2144 12/06/18 2231  GLUCAP 113* 211* 112* 67* 142*   D-Dimer: No results for input(s): DDIMER in the last 72 hours. Hgb A1c: No results for input(s): HGBA1C in the last 72 hours. Lipid Profile: No results for input(s): CHOL, HDL, LDLCALC, TRIG, CHOLHDL, LDLDIRECT in the last 72 hours. Thyroid function studies: No results for input(s): TSH, T4TOTAL, T3FREE,  THYROIDAB in the last 72 hours.  Invalid input(s): FREET3 Anemia work up: No results for input(s): VITAMINB12, FOLATE, FERRITIN, TIBC, IRON, RETICCTPCT in the last 72 hours. Sepsis Labs: Recent Labs  Lab 12/05/18 0349 12/06/18 0306 12/06/18 1821 12/07/18 0453  WBC 9.4 11.1* 9.4 7.8   Microbiology No results found for this or any previous visit (from the past 240 hour(s)).   Medications:   . docusate sodium  100 mg Oral Daily  . feeding supplement (ENSURE ENLIVE)  237 mL Oral TID BM  . insulin aspart  0-5 Units Subcutaneous QHS  . insulin aspart  0-9 Units Subcutaneous TID WC  . insulin detemir  5 Units Subcutaneous BID  . pantoprazole  40 mg Oral BID  . polyethylene glycol  17 g Oral Daily  . sodium chloride flush  10-40 mL Intracatheter Q12H  . sucralfate  1 g Oral  TID WC & HS   Continuous Infusions: . sodium chloride 75 mL/hr at 12/06/18 1918  . magnesium sulfate 1 - 4 g bolus IVPB         LOS: 95 days   Charlynne Cousins  [facility]  12/07/2018, 8:42 AM

## 2018-12-07 NOTE — Progress Notes (Addendum)
Nutrition Follow-up  DOCUMENTATION CODES:   Severe malnutrition in context of chronic illness, Underweight  INTERVENTION:   -Continue Ensure Enlive po TID, each supplement provides 350 kcal and 20 grams of protein -Continue Magic Cup BID with meals, each supplement provides 290 kcals and 9 grams protein -MVI with minerals daily  NUTRITION DIAGNOSIS:   Severe Malnutrition related to chronic illness(Pancreatic cancer, DM and CHF) as evidenced by severe muscle depletion, severe fat depletion, percent weight loss.  Ongoing  GOAL:   Patient will meet greater than or equal to 90% of their needs  Progressing  MONITOR:   PO intake, Supplement acceptance, Skin  REASON FOR ASSESSMENT:   Consult Assessment of nutrition requirement/status  ASSESSMENT:   58 y.o male with PMH: recent dx pancreatic cancer, DM, CHF, crack cocaine use, emboli of legs, smoker. Pt admitted with DKA and GI bleed. 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 Received TPN from 2/14 to 2/23.  Reviewed I/O's: +40 ml x 24 hours and +977.2 ml x 11/23/18  Per MD notes, pt now with lower GIB and has now elected for DNR/DNI status. MD reports poor prognosis and has re-consulted palliative care to further address goals of care and potential to transition to hospice care.   Pt remains good appetite; meal completion 50-100%. Per MAR, pt has been refusing Ensure supplements, however, did accept supplement this morning.   Vascular surgery continues to follow for rt BKA wound, which is healing with with wet to dry dressings.   Per CSW notes, continue to search from SNF for discharge, however, pt remains difficult to place.   Labs reviewed: CBGS: 67-142 (inpatient orders for glycemic control are 0-5 units insulin aspart q HS, 0-9 units insulin aspart TID with meals, and 5 units insulin detemir BID).   Diet Order:   Diet Order            Diet heart healthy/carb modified Room service  appropriate? Yes; Fluid consistency: Thin  Diet effective now              EDUCATION NEEDS:   Education needs have been addressed  Skin:  Skin Assessment: Skin Integrity Issues: Skin Integrity Issues:: Other (Comment) Stage II: N/A Other: R leg BKA  Last BM:  12/07/18  Height:   Ht Readings from Last 1 Encounters:  11/12/18 5\' 11"  (1.803 m)    Weight:   Wt Readings from Last 1 Encounters:  11/12/18 56.2 kg    Ideal Body Weight:  73.1 kg  BMI:  Body mass index is 17.28 kg/m.  Estimated Nutritional Needs:   Kcal:  2000-2200 kcal  Protein:  100-110 grams  Fluid:  >/= 2L/day    Dionisios Ricci A. Jimmye Norman, RD, LDN, [place] Registered Dietitian II Certified Diabetes Care and Education Specialist Pager: [phone] After hours Pager: [phone]

## 2018-12-07 NOTE — Progress Notes (Signed)
Informed pts BGL was 66. Pt just received tray and was given an insure. Will continue to monitor.

## 2018-12-07 NOTE — Progress Notes (Signed)
Patient ID: Ryan Medina, male   DOB: 1960/11/04, 58 y.o.   MRN: [id number]  This NP visited patient at the bedside as a follow up for palliative medicine needs and emotional support.  Patient has had a long complicated hospitalization; inoperable pancreatic adenocarcinoma,  HTN, DM, COPD, ischemic right leg/ s/p BKA, GIB, PE/ DVT, s/p gastric outlet surgery, malnutrition,  substance abuse all compounded by dense psychosocial issues ((homeless  and minimal social support)  Now complicated with rectal bleeding and new noted liver lesions.   Discussed Mr. Bienkowski with Dr. Venetia Constable today.  I had a long conversation with Mr. Duce regarding his serious medical situation and long-term poor prognosis.   He tells me today that he did not realize that his situation was terminal.  I encouraged him to talk with Dr. Marin Olp regarding the feasibility of systemic therapy for his cancer diagnosis, and the ultimate benefit of such  Treatments.   He wishes to talk with his family regarding his medical situation. Questions and concerns addressed     I sat with Mr. Kamel and offered presence and emotional support.  As has been for the last many months the limited options for transition of care.  Mr. Manes was homeless when he came into the hospital.   Palliative medicine team will continue to support holistically.  Patient denies pain or discomfort currently.  Total time spent on the unit was 45 minutes      Discussed with Dr Venetia Constable  Greater than 50% of the time was spent in counseling and coordination of care  Wadie Lessen NP  Palliative Medicine Team Team Phone # [phone] Pager [phone]

## 2018-12-07 NOTE — Progress Notes (Signed)
[place] for Enoxaparin Indication: PE, DVT  Allergies  Allergen Reactions  . Lisinopril Anaphylaxis   Patient Measurements: Height: 5\' 11"  (180.3 cm) Weight: 123 lb 14.4 oz (56.2 kg) IBW/kg (Calculated) : 75.3 Heparin Dosing Weight: 66.7  Vital Signs: Temp: 97.9 F (36.6 C) (03/17 0401) Temp Source: Oral (03/17 0401) BP: 122/97 (03/17 0401) Pulse Rate: 90 (03/17 0401)  Labs: Recent Labs    12/04/18 1830  12/05/18 0349 12/06/18 0306 12/06/18 1821 12/07/18 0453  HGB  --    < > 10.5* 10.8* 10.5* 9.1*  HCT  --    < > 32.3* 33.8* 33.1* 29.0*  PLT  --    < > 269 255 239 201  CREATININE 0.57*  --  0.56* 0.44*  --   --    < > = values in this interval not displayed.   Estimated Creatinine Clearance: 81 mL/min (A) (by C-G formula based on SCr of 0.44 mg/dL (L)).  Assessment: 58 yo male acute PE diagnosed on 2/5. Has been on heparin, enoxaparin, and apixaban over course of treatment   Anticoag: Acute PE diagnosed on 2/5.   - 11/23/18 Dr. Martha Clan changed apixaban to 5 mg BID and elected not to complete loading dose due to high bleed risk  - 3/17 back to enox per ONC  Renal: SCr 0.44  Heme/Onc: hypercoagulable from possible adenocarcinoma of the pancreas, inoperable Transfused 26u this admit  H&H 9.1/29, Plt 201  Plan:  DC apixaban Resume enox 1 mg/kg q12h per oncology recs F/U patient acceptance of injections  Levester Fresh, PharmD, BCPS, BCCCP Clinical Pharmacist [phone]  Please check AMION for all [place] numbers  12/07/2018 9:28 AM

## 2018-12-08 DIAGNOSIS — Z66 Do not resuscitate: Secondary | ICD-10-CM | POA: Diagnosis not present

## 2018-12-08 LAB — CBC
HCT: 32 % — ABNORMAL LOW (ref 39.0–52.0)
Hemoglobin: 10.2 g/dL — ABNORMAL LOW (ref 13.0–17.0)
MCH: 28.6 pg (ref 26.0–34.0)
MCHC: 31.9 g/dL (ref 30.0–36.0)
MCV: 89.6 fL (ref 80.0–100.0)
Platelets: 204 10*3/uL (ref 150–400)
RBC: 3.57 MIL/uL — ABNORMAL LOW (ref 4.22–5.81)
RDW: 14.2 % (ref 11.5–15.5)
WBC: 8.8 10*3/uL (ref 4.0–10.5)
nRBC: 0 % (ref 0.0–0.2)

## 2018-12-08 LAB — GLUCOSE, CAPILLARY
GLUCOSE-CAPILLARY: 168 mg/dL — AB (ref 70–99)
GLUCOSE-CAPILLARY: 132 mg/dL — AB (ref 70–99)
Glucose-Capillary: 20 mg/dL — CL (ref 70–99)
Glucose-Capillary: 202 mg/dL — ABNORMAL HIGH (ref 70–99)
Glucose-Capillary: 195 mg/dL — ABNORMAL HIGH (ref 70–99)
Glucose-Capillary: 176 mg/dL — ABNORMAL HIGH (ref 70–99)

## 2018-12-08 MED ORDER — DEXTROSE 50 % IV SOLN
INTRAVENOUS | Status: AC
  Administered 2018-12-08: 50 mL
  Filled 2018-12-08: qty 50

## 2018-12-08 NOTE — Plan of Care (Signed)
  Problem: Pain Managment: Goal: General experience of comfort will improve Outcome: Progressing   Problem: Skin Integrity: Goal: Demonstration of wound healing without infection will improve Outcome: Progressing

## 2018-12-08 NOTE — Progress Notes (Signed)
[facility] PROGRESS NOTE    Progress Note  Wandell Scullion Romulus  BPZ:[id number] DOB: 1961-09-02 DOA: 09/03/2018 PCP: Patient, No Pcp Per     Brief Narrative:   Dorrance Sellick Bagnell is an 58 y.o. male past medical history of diabetes mellitus, hypertension, systolic heart failure with an EF of 45%, polysubstance abuse who presented on 09/03/2018 with right leg pain was found to have an acute thromboembolic ischemic event taken emergent leading to embolectomy  Major Hospital events: - 09/03/2018 Right popliteal and tibial embolectomy due to ischemic right lower extremity. - 09/05/2018 Evacuation of hematoma and fasciotomy of the right leg due to bleeding from right leg wound. - 09/07/2018 EGD showing active bleeding ulcer. - 09/07/2018 Decompensated and intubated due to a duodenal ulcer with hemorrhage IR performed coiling. -  09/10/2018 Recurrent right lower extremity embolism requiring repeated leg embolectomy. -09/11/2018 CTA showed pancreatic mass with mural thrombus and dilated bilateral iliac arteries. - EUS biopsy of pancreatic cancer 09/16/2018. -  09/12/2019 Diagnosed with locally advanced pancreatic cancer incidentally. -Transferred to [facility] long hospital for radiation from 09/30/2020 10/13/2018 Port-A-Cath placed on 10/12/2018 for chemotherapy (has not been started pending stabilization) - PE on 08/27/2019 anticoagulation restarted. - 10/29/2018 repeated EGD for follow-up ulcer showed malignant pyloric stenosis and candidiasis - 11/04/2018 open gastrojejunostomy was performed due to malignant pyloric stenosis. -09/12/2019 EGD repeated for hematochezia, no source of bleeding. 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 Patient needs oxycodone for pain.  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 Currently n.p.o.  Acute blood loss anemia on anemia of chronic disease/hematochezia/malignant duodenal ulcer: On a PPI and sucralfate, he has received several doses of Feraheme, currently on oral iron.  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  Candida esophagitis: He has completed treatment.  Acute Pulmonary embolism/new portal vein thrombosis on 12/07/2018: The case was discussed with hematology oncology and the patient and patient refused Lovenox, the patient opted for Eliquis which is sub-optimal alternative. Discussed the case with oncology and they recommended to restart Lovenox, they will come and reevaluate the patient.  Chronic systolic and diastolic heart failure: Stable no signs of overload.  Sepsis from healthcare associated pneumonia/Pseudomonas UTI: He has completed treatment with IV cefepime.  DM (diabetes mellitus), type 2, uncontrolled (HCC) Cont. long-acting insulin plus sliding scale insulin.  New lower GI bleed/abdominal pain: CT scan showed no acute findings, like bowel obstruction, it showed stable pancreatic mass to small densities in the liver, I did show possible thrombosis of the inferior vena cava which may extend to the iliacs and femoral. Hemoglobin today is 9. His abdominal pain is resolved, his Eliquis was held and he had 2 bloody bowel movements on 3.16.2020. He has had no further bowel movements overnight.  We will reconsult hematology oncology as he  has not possible thrombosis of the inferior vena cava extending into the iliac and femoral while on Eliquis.  Now that he  is bleeding a complicates the situation due to his poor prognoses and his new lower GI bleed it makes it difficult to start anticoagulation. We will have to re-consult hospice and palliative care as he is now decided to move to DNR/DNI I have explained to him his lifespan will lerss than 4 months and we will probably have to readdress end-of-life and prognoses with him. Discussed the case with oncology and they recommended to start Lovenox and monitor him.  Disposition: PT OT evaluated the patient and recommended skilled nursing facility for his BKA as well as his malnutrition. The patient has been in the hospital more than 3 days. Needs rehabilitation at least 5 days a week to facilitate daily activities and transition to home. Rehab is being requested as the most appropriate discharge option for the patient is not felt to be custodial care as evidenced by the patient's inability to perform ADLs and ambulate.    RN Pressure Injury Documentation:    Estimated body mass index is 17.28 kg/m as calculated from the following:   Height as of this encounter: 5\' 11"  (1.803 m).   Weight as of this encounter: 56.2 kg. Malnutrition Type:  Nutrition Problem: Severe Malnutrition Etiology: chronic illness(Pancreatic cancer, DM and CHF)   Malnutrition Characteristics:  Signs/Symptoms: severe muscle depletion, severe fat depletion, percent weight loss Percent weight loss: 23 %   Nutrition Interventions:  Interventions: Ensure Enlive (each supplement provides 350kcal and 20 grams of protein), Magic cup    DVT prophylaxis: Eliquis Family Communication:none Disposition Plan/Barrier to D/C: SNF when bed available Code Status:     Code Status Orders  (From admission, onward)         Start     Ordered   09/07/18 1850  Limited resuscitation (code)  Continuous     Question Answer Comment  In the event of cardiac or respiratory ARREST: Initiate Code Blue, Call Rapid Response No   In the event of cardiac or respiratory ARREST: Perform CPR No   In the event of cardiac or respiratory ARREST: Perform Intubation/Mechanical Ventilation Yes   In the event of cardiac or respiratory ARREST: Use NIPPV/BiPAp only if indicated No   In the event of cardiac or respiratory ARREST: Administer ACLS medications if indicated No   In the event of cardiac or respiratory ARREST: Perform Defibrillation or Cardioversion if indicated No   Comments Continue current care with mechanical ventilation and vasopressor support.      09/07/18 1850        Code Status History    Date Active Date Inactive Code Status Order ID Comments User Context   09/03/2018 2251 09/07/2018 1850 Full Code [id number]  [name], MD Inpatient   09/03/2018 2236 09/03/2018 2251 Full Code [id number]  Iline Oven Inpatient   08/22/2018 2203 08/25/2018 1655 Full Code [id number]  Ivor Costa, MD ED   01/30/2018 0308 01/31/2018 1807 Full Code [id number]  [name], MD ED   05/19/2016 1029 05/19/2016 1721 Full Code [id number]  Leonie Man, MD Inpatient        IV Access:    Peripheral IV   Procedures and diagnostic studies:   Ct Abdomen Pelvis W Contrast  Result Date: 12/07/2018 CLINICAL DATA:  Recently diagnosed locally advanced stage IV adenocarcinoma of the pancreas nonoperative, he has not started chemotherapy, with a right leg gangrene due to popliteal artery embolus, 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 Epigastric pain/bloody stools. EXAM: CT ABDOMEN AND PELVIS WITH CONTRAST TECHNIQUE:  Multidetector CT imaging of the abdomen and pelvis was performed using the standard protocol following bolus administration of intravenous contrast. CONTRAST:  151mL OMNIPAQUE IOHEXOL 300 MG/ML  SOLN COMPARISON:  11/25/2018 and older exams. FINDINGS: Lower chest:  Opacity consistent with atelectasis noted at the left lateral lung base, stable from the prior exam. Lung bases otherwise clear. Hepatobiliary: Subtle liver lesions. 7 mm hypoattenuating lesion, right lobe segment 6. Similar-sized similar appearing lesion segment 4 B. tiny low-density lesion lateral right lobe, likely a cyst. 1 cm probable cyst, segment 2. No other discrete liver abnormalities. Normal gallbladder. No intrahepatic bile duct dilation. More distal common bile duct is not well visualized. There is metallic density projecting along the anterior margin of the pancreas which may reflect a vascular embolization coil. It is stable. Pancreas: Not well-defined. Heterogeneous appearance of the tail consistent with the known mass, is without significant change. Spleen: Normal in size without focal abnormality. Adrenals/Urinary Tract: No adrenal masses. Kidneys normal in size, orientation and position with symmetric enhancement and excretion. Tiny low-density lesion in the lower pole the right kidney consistent with a cyst. No stones. No hydronephrosis. Ureters are not well visualized due to increased attenuation of the retroperitoneal fat as well as the paucity of fat. No evidence of ureteral dilation or of a ureteral stone. Bladder is unremarkable. Stomach/Bowel: Stomach mildly distended. Folds appear prominent. The duodenum is not well-defined. Small bowel is normal in caliber.  No convincing wall thickening. Mild increased stool burden in the colon. No colonic dilation. Colonic wall is not well-defined, but there is no convincing wall thickening or inflammation. Vascular/Lymphatic: 2.9 cm abdominal aortic aneurysm. Aortic atherosclerosis. 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 Significant internal iliac atherosclerosis. Limited enhancement of the inferior vena cava below the level of the left renal artery. Could not exclude inferior vena cava  thrombosis. Reproductive: Unremarkable. Other: Generalized increased attenuation of fat throughout the peritoneal cavity. Questionable small serosal nodules over the dome of the liver. Musculoskeletal: No fracture or acute finding. No osteoblastic or osteolytic lesions. IMPRESSION: 1. Study is limited due to the relative increased attenuation of the peritoneal fat limiting contrast between the peritoneal fat and adjacent structures. 2. No convincing acute finding.  No bowel obstruction. 3. Stable appearance of the pancreatic mass, poorly defined on the current exam. 4. Two small low-density liver lesions are noted suspicious for metastatic disease. 5. Possible thrombosis of the inferior vena cava, which may extend into the iliac and femoral veins. 6. Stable aortic and iliac artery aneurysms. Electronically Signed   By: Lajean Manes M.D.   On: 12/07/2018 00:50     Medical Consultants:    None.  Anti-Infectives:   None  Subjective:    [place]  Is laying in bed, denies pain, no new complaint, He  is tolerating his diet  Awaiting for placement  Objective:    Vitals:   12/07/18 2208 12/08/18 0433 12/08/18 0436 12/08/18 1500  BP: 122/90  (!) 122/94 106/85  Pulse: (!) 115  77 73  Resp: 18 15 15    Temp: 98 F (36.7 C)  97.7 F (36.5 C)   TempSrc: Oral  Oral   SpO2: 100%  100% 100%  Weight:      Height:        Intake/Output Summary (Last 24 hours) at 12/08/2018 1621 Last data filed at 12/08/2018 0300 Gross per 24 hour  Intake -  Output 800  ml  Net -800 ml   Filed Weights   11/02/18 1806 11/04/18 0831 11/12/18 0742  Weight: 56.2 kg 56.2 kg 56.2 kg    Exam: General exam: In no acute distress. Respiratory system: Good air movement and clear to auscultation. Cardiovascular system: S1 & S2 heard, RRR.  Gastrointestinal system: Abdomen is nondistended, soft and nontender.  Central nervous system: Alert and oriented. No focal neurological deficits. Extremities: Below the  knee amputation on the right. Skin: No rashes, lesions or ulcers Psychiatry: Judgement and insight appear normal. Mood & affect appropriate.    Data Reviewed:    Labs: Basic Metabolic Panel: Recent Labs  Lab 12/02/18 0430 12/03/18 0403 12/04/18 1830 12/05/18 0349 12/06/18 0306  NA 136  --  138 136 136  K 3.4*  --  2.7* 3.7 4.1  CL 102  --  104 102 104  CO2 27  --  26 27 28   GLUCOSE 207*  --  179* 306* 49*  BUN 11  --  8 9 11   CREATININE 0.52* 0.56* 0.57* 0.56* 0.44*  CALCIUM 8.5*  --  8.2* 8.3* 8.4*   GFR Estimated Creatinine Clearance: 81 mL/min (A) (by C-G formula based on SCr of 0.44 mg/dL (L)). Liver Function Tests: Recent Labs  Lab 12/02/18 0430  AST 42*  ALT 60*  ALKPHOS 117  BILITOT 0.4  PROT 6.6  ALBUMIN 2.0*   No results for input(s): LIPASE, AMYLASE in the last 168 hours. No results for input(s): AMMONIA in the last 168 hours. Coagulation profile No results for input(s): INR, PROTIME in the last 168 hours.  CBC: Recent Labs  Lab 12/06/18 0306 12/06/18 1821 12/07/18 0453 12/07/18 1843 12/08/18 0427  WBC 11.1* 9.4 7.8 9.4 8.8  NEUTROABS  --  7.6  --   --   --   HGB 10.8* 10.5* 9.1* 10.6* 10.2*  HCT 33.8* 33.1* 29.0* 32.2* 32.0*  MCV 90.1 89.9 89.2 90.2 89.6  PLT 255 239 201 192 204   Cardiac Enzymes: No results for input(s): CKTOTAL, CKMB, CKMBINDEX, TROPONINI in the last 168 hours. BNP (last 3 results) No results for input(s): PROBNP in the last 8760 hours. CBG: Recent Labs  Lab 12/07/18 1141 12/07/18 1650 12/07/18 2203 12/08/18 0845 12/08/18 1130  GLUCAP 175* 132* 199* 168* 132*   D-Dimer: No results for input(s): DDIMER in the last 72 hours. Hgb A1c: No results for input(s): HGBA1C in the last 72 hours. Lipid Profile: No results for input(s): CHOL, HDL, LDLCALC, TRIG, CHOLHDL, LDLDIRECT in the last 72 hours. Thyroid function studies: No results for input(s): TSH, T4TOTAL, T3FREE, THYROIDAB in the last 72 hours.  Invalid  input(s): FREET3 Anemia work up: No results for input(s): VITAMINB12, FOLATE, FERRITIN, TIBC, IRON, RETICCTPCT in the last 72 hours. Sepsis Labs: Recent Labs  Lab 12/06/18 1821 12/07/18 0453 12/07/18 1843 12/08/18 0427  WBC 9.4 7.8 9.4 8.8   Microbiology No results found for this or any previous visit (from the past 240 hour(s)).   Medications:   . docusate sodium  100 mg Oral Daily  . enoxaparin (LOVENOX) injection  1.5 mg/kg Subcutaneous Q24H  . feeding supplement (ENSURE ENLIVE)  237 mL Oral TID BM  . insulin aspart  0-5 Units Subcutaneous QHS  . insulin aspart  0-9 Units Subcutaneous TID WC  . insulin detemir  5 Units Subcutaneous Daily  . multivitamin with minerals  1 tablet Oral Daily  . pantoprazole  40 mg Oral BID  . polyethylene glycol  17 g Oral  Daily  . sodium chloride flush  10-40 mL Intracatheter Q12H  . sucralfate  1 g Oral TID WC & HS   Continuous Infusions: . magnesium sulfate 1 - 4 g bolus IVPB         LOS: 96 days   Florencia Reasons  [facility]  12/08/2018, 4:21 PM

## 2018-12-08 NOTE — Progress Notes (Signed)
[facility]  Subjective  - Doing welll over all, with good active range of motion of his right LE s/p BKA.   Objective (!) 122/94 77 97.7 F (36.5 C) (Oral) 15 100%  Intake/Output Summary (Last 24 hours) at 12/08/2018 0811 Last data filed at 12/08/2018 0300 Gross per 24 hour  Intake 2190 ml  Output 1600 ml  Net 590 ml    Skin warm and well perfused. Dressing clean and dry was just changed per nursing.    Assessment/Planning: POD # 19 BKA  Continued daily dressing changes with medial wound wet to dry packing.    Roxy Horseman 12/08/2018 8:11 AM --  Laboratory Lab Results: Recent Labs    12/07/18 1843 12/08/18 0427  WBC 9.4 8.8  HGB 10.6* 10.2*  HCT 32.2* 32.0*  PLT 192 204   BMET Recent Labs    12/06/18 0306  NA 136  K 4.1  CL 104  CO2 28  GLUCOSE 49*  BUN 11  CREATININE 0.44*  CALCIUM 8.4*    COAG Lab Results  Component Value Date   INR 1.31 11/02/2018   INR 1.15 10/11/2018   INR 1.23 10/02/2018   No results found for: PTT

## 2018-12-09 LAB — GLUCOSE, CAPILLARY
Glucose-Capillary: 187 mg/dL — ABNORMAL HIGH (ref 70–99)
Glucose-Capillary: 135 mg/dL — ABNORMAL HIGH (ref 70–99)
Glucose-Capillary: 59 mg/dL — ABNORMAL LOW (ref 70–99)
Glucose-Capillary: 69 mg/dL — ABNORMAL LOW (ref 70–99)
Glucose-Capillary: 156 mg/dL — ABNORMAL HIGH (ref 70–99)
Glucose-Capillary: 150 mg/dL — ABNORMAL HIGH (ref 70–99)

## 2018-12-09 NOTE — Progress Notes (Signed)
[facility] PROGRESS NOTE    Progress Note  Ryan Medina  HWE:[id number] DOB: 1961/06/06 DOA: 09/03/2018 PCP: Patient, No Pcp Per     Brief Narrative:   Ryan Medina is an 58 y.o. male past medical history of diabetes mellitus, hypertension, systolic heart failure with an EF of 45%, polysubstance abuse who presented on 09/03/2018 with right leg pain was found to have an acute thromboembolic ischemic event taken emergent leading to embolectomy  Major Hospital events: - 09/03/2018 Right popliteal and tibial embolectomy due to ischemic right lower extremity. - 09/05/2018 Evacuation of hematoma and fasciotomy of the right leg due to bleeding from right leg wound. - 09/07/2018 EGD showing active bleeding ulcer. - 09/07/2018 Decompensated and intubated due to a duodenal ulcer with hemorrhage IR performed coiling. -  09/10/2018 Recurrent right lower extremity embolism requiring repeated leg embolectomy. -09/11/2018 CTA showed pancreatic mass with mural thrombus and dilated bilateral iliac arteries. - EUS biopsy of pancreatic cancer 09/16/2018. -  09/12/2019 Diagnosed with locally advanced pancreatic cancer incidentally. -Transferred to [facility] for radiation from 09/30/2020 10/13/2018 Port-A-Cath placed on 10/12/2018 for chemotherapy (has not been started pending stabilization) - PE on 08/27/2019 anticoagulation restarted. - 10/29/2018 repeated EGD for follow-up ulcer showed malignant pyloric stenosis and candidiasis - 11/04/2018 open gastrojejunostomy was performed due to malignant pyloric stenosis. -09/12/2019 EGD repeated for hematochezia, no source of bleeding. 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 Patient needs oxycodone for pain.  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  Candida esophagitis: He has completed treatment.  Acute Pulmonary embolism/new portal vein thrombosis on 12/07/2018: The case was discussed with hematology oncology and the patient and patient refused Lovenox, the patient opted for Eliquis which is sub-optimal alternative. My colleague Dr Aileen Fass Discussed the case with oncology who recommended restart Lovenox, currently he is tolerating it.  Acute blood loss anemia on anemia of chronic disease/hematochezia/malignant duodenal ulcer: On a PPI and sucralfate, he has received several doses of Feraheme, currently on oral iron.  New lower GI bleed/abdominal pain: -CT scan showed no acute findings, like bowel obstruction, it showed stable pancreatic mass to small densities in the liver, it did show possible thrombosis of the inferior vena cava which may extend to the iliacs and femoral. -His abdominal pain is resolved, his Eliquis was held and he had 2 bloody bowel movements on 3.16.2020. -He has had no further bowel movements overnight.   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  Now that he is bleeding, a complicates the situation due to his poor prognoses and his new lower GI bleed it makes it difficult to start anticoagulation. We will have to re-consult hospice and palliative  care as he is now decided to move to DNR/DNI  -my  colleague Dr Aileen Fass have explained to patient his lifespan will less than 4 months - may need to  readdress end-of-life and prognoses with him. -Discussed the case with oncology and they recommended to start Lovenox and monitor him.  Chronic systolic and diastolic heart failure: Stable no signs of overload.  Sepsis from healthcare associated pneumonia/Pseudomonas UTI: He has completed treatment with IV cefepime.  DM (diabetes mellitus), type 2, uncontrolled (HCC) Cont. long-acting insulin plus sliding scale insulin.   Disposition: PT OT evaluated the patient and recommended skilled nursing facility for his BKA as well as his malnutrition. The patient has been in the hospital more than 3 days. Needs rehabilitation at least 5 days a week to facilitate daily activities and transition to home. Rehab is being requested as the most appropriate discharge option for the patient is not felt to be custodial care as evidenced by the patient's inability to perform ADLs and ambulate.    RN Pressure Injury Documentation:    Estimated body mass index is 17.28 kg/m as calculated from the following:   Height as of this encounter: 5\' 11"  (1.803 m).   Weight as of this encounter: 56.2 kg. Malnutrition Type:  Nutrition Problem: Severe Malnutrition Etiology: chronic illness(Pancreatic cancer, DM and CHF)   Malnutrition Characteristics:  Signs/Symptoms: severe muscle depletion, severe fat depletion, percent weight loss Percent weight loss: 23 %   Nutrition Interventions:  Interventions: Ensure Enlive (each supplement provides 350kcal and 20 grams of protein), Magic cup    DVT prophylaxis: Eliquis Family Communication:none Disposition Plan/Barrier to D/C: SNF when bed available Code Status:     Code Status Orders  (From admission, onward)         Start     Ordered   09/07/18 1850  Limited resuscitation (code)  Continuous     Question Answer Comment  In the event of cardiac or respiratory ARREST: Initiate Code Blue, Call Rapid Response No   In the event of cardiac or respiratory ARREST: Perform CPR No   In the event of cardiac or respiratory ARREST: Perform Intubation/Mechanical Ventilation Yes   In the event of cardiac or respiratory ARREST: Use NIPPV/BiPAp only if indicated No   In the event of cardiac or respiratory ARREST: Administer ACLS medications if indicated No   In the event of cardiac or respiratory ARREST: Perform Defibrillation or Cardioversion if indicated No   Comments Continue current care with mechanical ventilation and vasopressor support.      09/07/18 1850        Code Status History    Date Active Date Inactive Code Status Order ID Comments User Context   09/03/2018 2251 09/07/2018 1850 Full Code [id number]  [name], MD Inpatient   09/03/2018 2236 09/03/2018 2251 Full Code [id number]  Iline Oven Inpatient   08/22/2018 2203 08/25/2018 1655 Full Code [id number]  Ivor Costa, MD ED   01/30/2018 0308 01/31/2018 1807 Full Code [id number]  [name], MD ED   05/19/2016 1029 05/19/2016 1721 Full Code [id number]  Leonie Man, MD Inpatient        IV Access:    Peripheral IV   Procedures and diagnostic studies:   No results found.   Medical Consultants:    None.  Anti-Infectives:   None  Subjective:    Ryan Medina  Is ambulating in room with a walker, he denies pain, no new complaint,  He is tolerating his diet , denies ab pain, had one bm which is not bloody  Awaiting for placement  Objective:    Vitals:   12/08/18 0436 12/08/18 1500 12/08/18 2021 12/09/18 0432  BP: (!) 122/94 106/85 122/86 120/84  Pulse: 77 73 94 (!) 114  Resp: 15  15 15   Temp: 97.7 F (36.5 C)  98.3 F (36.8 C) 98 F (36.7 C)  TempSrc: Oral  Oral Oral  SpO2: 100% 100% 100% 100%  Weight:      Height:        Intake/Output Summary (Last 24 hours) at 12/09/2018  1323 Last data filed at 12/09/2018 0500 Gross per 24 hour  Intake 200 ml  Output 100 ml  Net 100 ml   Filed Weights   11/02/18 1806 11/04/18 0831 11/12/18 0742  Weight: 56.2 kg 56.2 kg 56.2 kg    Exam: General exam: In no acute distress. Thin,  + chest port Respiratory system: Good air movement and clear to auscultation. Cardiovascular system: S1 & S2 heard, RRR.  Gastrointestinal system: Abdomen is nondistended, soft and nontender.  Central nervous system: Alert and oriented. No focal neurological deficits. 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 Dressing intact Skin: No rashes, lesions or ulcers Psychiatry: Judgement and insight appear normal. Mood & affect appropriate.    Data Reviewed:    Labs: Basic Metabolic Panel: Recent Labs  Lab 12/03/18 0403  12/04/18 1830 12/05/18 0349 12/06/18 0306  NA  --   --  138 136 136  K  --    < > 2.7* 3.7 4.1  CL  --   --  104 102 104  CO2  --   --  26 27 28   GLUCOSE  --   --  179* 306* 49*  BUN  --   --  8 9 11   CREATININE 0.56*  --  0.57* 0.56* 0.44*  CALCIUM  --   --  8.2* 8.3* 8.4*   < > = values in this interval not displayed.   GFR Estimated Creatinine Clearance: 81 mL/min (A) (by C-G formula based on SCr of 0.44 mg/dL (L)). Liver Function Tests: No results for input(s): AST, ALT, ALKPHOS, BILITOT, PROT, ALBUMIN in the last 168 hours. No results for input(s): LIPASE, AMYLASE in the last 168 hours. No results for input(s): AMMONIA in the last 168 hours. Coagulation profile No results for input(s): INR, PROTIME in the last 168 hours.  CBC: Recent Labs  Lab 12/06/18 0306 12/06/18 1821 12/07/18 0453 12/07/18 1843 12/08/18 0427  WBC 11.1* 9.4 7.8 9.4 8.8  NEUTROABS  --  7.6  --   --   --   HGB 10.8* 10.5* 9.1* 10.6* 10.2*  HCT 33.8* 33.1* 29.0* 32.2* 32.0*  MCV 90.1 89.9 89.2 90.2 89.6  PLT 255 239 201 192 204   Cardiac Enzymes: No results for input(s): CKTOTAL, CKMB, CKMBINDEX, TROPONINI in the  last 168 hours. BNP (last 3 results) No results for input(s): PROBNP in the last 8760 hours. CBG: Recent Labs  Lab 12/08/18 1450 12/08/18 1459 12/08/18 1707 12/08/18 2202 12/09/18 0831  GLUCAP 20* 202* 195* 176* 187*   D-Dimer: No results for input(s): DDIMER in the last 72 hours. Hgb A1c: No results for input(s): HGBA1C in the last 72 hours. Lipid Profile: No results for input(s): CHOL, HDL, LDLCALC, TRIG, CHOLHDL, LDLDIRECT in the last 72 hours. Thyroid function studies: No results for input(s): TSH, T4TOTAL, T3FREE, THYROIDAB in the last 72 hours.  Invalid input(s): FREET3 Anemia work up: No results for input(s): VITAMINB12, FOLATE, FERRITIN, TIBC, IRON, RETICCTPCT in the  last 72 hours. Sepsis Labs: Recent Labs  Lab 12/06/18 1821 12/07/18 0453 12/07/18 1843 12/08/18 0427  WBC 9.4 7.8 9.4 8.8   Microbiology No results found for this or any previous visit (from the past 240 hour(s)).   Medications:   . docusate sodium  100 mg Oral Daily  . enoxaparin (LOVENOX) injection  1.5 mg/kg Subcutaneous Q24H  . feeding supplement (ENSURE ENLIVE)  237 mL Oral TID BM  . insulin aspart  0-5 Units Subcutaneous QHS  . insulin aspart  0-9 Units Subcutaneous TID WC  . insulin detemir  5 Units Subcutaneous Daily  . multivitamin with minerals  1 tablet Oral Daily  . pantoprazole  40 mg Oral BID  . polyethylene glycol  17 g Oral Daily  . sodium chloride flush  10-40 mL Intracatheter Q12H  . sucralfate  1 g Oral TID WC & HS   Continuous Infusions: . magnesium sulfate 1 - 4 g bolus IVPB         LOS: 97 days   Florencia Reasons  [facility]  12/09/2018, 1:23 PM

## 2018-12-09 NOTE — Plan of Care (Signed)
  Problem: Pain Managment: Goal: General experience of comfort will improve Outcome: Progressing   Problem: Skin Integrity: Goal: Demonstration of wound healing without infection will improve Outcome: Progressing

## 2018-12-09 NOTE — Progress Notes (Signed)
Inpatient Diabetes Program Recommendations  AACE/ADA: New Consensus Statement on Inpatient Glycemic Control   Target Ranges:  Prepandial:   less than 140 mg/dL      Peak postprandial:   less than 180 mg/dL (1-2 hours)      Critically ill patients:  140 - 180 mg/dL   Results for Ryan Medina, Ryan Medina (MRN [id number]) as of 12/09/2018 08:24  Ref. Range 12/08/2018 08:45 12/08/2018 11:30 12/08/2018 14:50 12/08/2018 14:59 12/08/2018 17:07 12/08/2018 22:02  Glucose-Capillary Latest Ref Range: 70 - 99 mg/dL 168 (H)  Novolog 2 units @ 10:12  Levemir 5 units @ 10:13 132 (H)  Novolog 1 unit @ 12:38 20 (LL) 202 (H) 195 (H)  Novolog 2 units @ 17:17 176 (H)    Review of Glycemic Control  Current orders for Inpatient glycemic control: Levemir 5 units daily, Novolog 0-9 units TID with meals, Novolog 0-5 units QHS   NOTE: In reviewing chart, noted glucose of 20 mg/dl at 14:50 on 12/08/18. Patient received Novolog 2 units at 10:12 for CBG obtained at 8:45 am on 12/08/18. Then CBG checked at 11:30 am and Novoog 1 unit given at 12:38. Anticipate CBG of 20 mg/dl at 14:50 due to insulin stacking (from 8am Novolog being given late and CBG being checked within 1 hour and 15 mins and then receiving Novolog correction again within 1 hour 45 mins of prior Novolog dose).  Do not recommend any changes with insulin orders at this time. NURSING: Please administer insulin within 1 hour of obtaining CBG. Will follow.  Thanks, Barnie Alderman, RN, MSN, CDE Diabetes Coordinator Inpatient Diabetes Program [phone] (Team Pager from 8am to 5pm)

## 2018-12-10 LAB — GLUCOSE, CAPILLARY
Glucose-Capillary: 151 mg/dL — ABNORMAL HIGH (ref 70–99)
Glucose-Capillary: 72 mg/dL (ref 70–99)
Glucose-Capillary: 85 mg/dL (ref 70–99)
Glucose-Capillary: 219 mg/dL — ABNORMAL HIGH (ref 70–99)

## 2018-12-10 LAB — CREATININE, SERUM
Creatinine, Ser: 0.61 mg/dL (ref 0.61–1.24)
GFR calc Af Amer: >60 mL/min (ref >60)
GFR calc non Af Amer: >60 mL/min (ref >60)

## 2018-12-10 NOTE — Progress Notes (Signed)
Occupational Therapy Treatment Patient Details Name: Ryan Medina MRN: [id number] DOB: 15-Sep-1961 Today's Date: 12/10/2018    History of present illness Pt is a 58 y.o. male with DM, HTN, sCHF EF 45% and polysubstance abuse/homelessness. He presented initially with left leg pain on 12/13, found to have acute thromboembolic ischemic left leg.  He was taken emergently for embolectomy.  Post-op course complicated.  Of primary importance, found to have inoperable pancreatic cancer.  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    OT comments  Pt. Seen for skilled OT.  Focus of session is HEP with focus on UE strengthening to aide in safety during functional transfers and ADL completion.  Pt. Able to return demo of BUE exercises with use of green and orange level theraband. Demonstrates good technique and states he completes throughout the day on his own.    Follow Up Recommendations  SNF -see general comments below   Equipment Recommendations  3 in 1 bedside commode;Wheelchair (measurements OT)    Recommendations for Other Services      Precautions / Restrictions Precautions Precautions: Fall Required Braces or Orthoses: Other Brace Other Brace: CAM boot (doesn't like to wear) Restrictions Weight Bearing Restrictions: No RUE Weight Bearing: Weight bearing as tolerated RLE Weight Bearing: Non weight bearing       Mobility Bed Mobility                  Transfers                      Balance                                           ADL either performed or assessed with clinical judgement   ADL                                               Vision       Perception     Praxis      Cognition                                                Exercises General Exercises - Upper Extremity Shoulder Flexion: 10 reps;Both;15 reps;Seated;Theraband Theraband Level  (Shoulder Flexion): Level 3 (Green) Shoulder Extension: AROM;Both;10 reps;Seated;Theraband Theraband Level (Shoulder Extension): Level 3 (Green) Shoulder Horizontal ABduction: AROM;Both;10 reps;Supine Shoulder Horizontal ADduction: AROM;Both;10 reps Elbow Flexion: AROM;Both;10 reps;Seated;Theraband Theraband Level (Elbow Flexion): Level 3 (Green) Elbow Extension: AROM;Both;10 reps;Seated;Theraband Theraband Level (Elbow Extension): Level 3 (Green)   Shoulder Instructions       General Comments  pt. States his younger brother Rodman Key has recently "gotten himself a little place to stay" and said that pt. Might be able to come and stay with him.     Pertinent Vitals/ Pain          Home Living  Prior Functioning/Environment              Frequency  Min 1X/week        Progress Toward Goals  OT Goals(current goals can now be found in the care plan section)  Progress towards OT goals: Progressing toward goals     Plan Discharge plan remains appropriate;Frequency needs to be updated    Co-evaluation                 AM-PAC OT "6 Clicks" Daily Activity     Outcome Measure   Help from another person eating meals?: None Help from another person taking care of personal grooming?: A Little Help from another person toileting, which includes using toliet, bedpan, or urinal?: A Little Help from another person bathing (including washing, rinsing, drying)?: A Little Help from another person to put on and taking off regular upper body clothing?: None Help from another person to put on and taking off regular lower body clothing?: A Little 6 Click Score: 20    End of Session Equipment Utilized During Treatment: Other (comment)(theraband)  OT Visit Diagnosis: Unsteadiness on feet (R26.81);Muscle weakness (generalized) (M62.81) Pain - Right/Left: Right Pain - part of body: Leg   Activity Tolerance Patient  tolerated treatment well   Patient Left in bed;with call bell/phone within reach   Nurse Communication Mobility status;Precautions        Time: 6861-6837 OT Time Calculation (min): 8 min  Charges: OT General Charges $OT Visit: 1 Visit OT Treatments $Therapeutic Exercise: 8-22 mins   Janice Coffin, COTA/L 12/10/2018, 8:48 AM

## 2018-12-10 NOTE — Plan of Care (Signed)
  Problem: Pain Managment: Goal: General experience of comfort will improve Outcome: Progressing   Problem: Skin Integrity: Goal: Demonstration of wound healing without infection will improve Outcome: Progressing

## 2018-12-10 NOTE — Progress Notes (Signed)
[facility]  Subjective  - No new complaints.   Objective (!) 109/97 (!) 54 98.3 F (36.8 C) (Oral) 16 100%  Intake/Output Summary (Last 24 hours) at 12/10/2018 0958 Last data filed at 12/09/2018 1330 Gross per 24 hour  Intake 240 ml  Output -  Net 240 ml    Minimal bloody drainage on old dressing medial and lateral areas.  Medial open wound with less packing needed.  Clean dry dressing applied over wet to dry packing.  Stump appears viable.  Assessment/Planning: POD 21 right BKA  Stump appears viable. Cont. Wet to dry packing in open wound dry dressing with ace wrap daily. WBC WNL and afebrile.  Roxy Horseman 12/10/2018 9:58 AM --  Laboratory Lab Results: Recent Labs    12/07/18 1843 12/08/18 0427  WBC 9.4 8.8  HGB 10.6* 10.2*  HCT 32.2* 32.0*  PLT 192 204   BMET Recent Labs    12/10/18 0350  CREATININE 0.61    COAG Lab Results  Component Value Date   INR 1.31 11/02/2018   INR 1.15 10/11/2018   INR 1.23 10/02/2018   No results found for: PTT

## 2018-12-10 NOTE — Progress Notes (Signed)
[facility] PROGRESS NOTE    Progress Note  Ryan Medina  ZJI:[id number] DOB: [birth year] DOA: 09/03/2018 PCP: Patient, No Pcp Per     Brief Narrative:   Ryan Medina is an 58 y.o. male past medical history of diabetes mellitus, hypertension, systolic heart failure with an EF of 45%, polysubstance abuse who presented on 09/03/2018 with right leg pain was found to have an acute thromboembolic ischemic event taken emergent leading to embolectomy  Major Hospital events: - 09/03/2018 Right popliteal and tibial embolectomy due to ischemic right lower extremity. - 09/05/2018 Evacuation of hematoma and fasciotomy of the right leg due to bleeding from right leg wound. - 09/07/2018 EGD showing active bleeding ulcer. - 09/07/2018 Decompensated and intubated due to a duodenal ulcer with hemorrhage IR performed coiling. -  09/10/2018 Recurrent right lower extremity embolism requiring repeated leg embolectomy. -09/11/2018 CTA showed pancreatic mass with mural thrombus and dilated bilateral iliac arteries. - EUS biopsy of pancreatic cancer 09/16/2018. -  09/12/2019 Diagnosed with locally advanced pancreatic cancer incidentally. -Transferred to [facility] long hospital for radiation from 09/30/2020 10/13/2018 Port-A-Cath placed on 10/12/2018 for chemotherapy (has not been started pending stabilization) - PE on 08/27/2019 anticoagulation restarted. - 10/29/2018 repeated EGD for follow-up ulcer showed malignant pyloric stenosis and candidiasis - 11/04/2018 open gastrojejunostomy was performed due to malignant pyloric stenosis. -09/12/2019 EGD repeated for hematochezia, no source of bleeding. 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 Patient needs oxycodone for pain.  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  Candida esophagitis: He has completed treatment.  Acute Pulmonary embolism/new portal vein thrombosis on 12/07/2018: The case was discussed with hematology oncology and the patient and patient refused Lovenox, the patient opted for Eliquis which is sub-optimal alternative. My colleague Dr Aileen Fass Discussed the case with oncology who recommended restart Lovenox, currently he is tolerating it.  Acute blood loss anemia on anemia of chronic disease/hematochezia/malignant duodenal ulcer: On a PPI and sucralfate, he has received several doses of Feraheme, currently on oral iron.  New lower GI bleed/abdominal pain: -CT scan showed no acute findings, like bowel obstruction, it showed stable pancreatic mass to small densities in the liver, it did show possible thrombosis of the inferior vena cava which may extend to the iliacs and femoral. -His abdominal pain is resolved, his Eliquis was held and he had 2 bloody bowel movements on 3.16.2020. -He has had no further bowel movements overnight.   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  Now that he is bleeding, a complicates the situation due to his poor prognoses and his new lower GI bleed it makes it difficult to start anticoagulation. We will have to re-consult hospice and palliative  care as he is now decided to move to DNR/DNI  -my  colleague Dr Aileen Fass have explained to patient his lifespan will less than 4 months - may need to  readdress end-of-life and prognoses with him. -Discussed the case with oncology and they recommended to start Lovenox and monitor him.  Chronic systolic and diastolic heart failure: Stable no signs of overload.  Sepsis from healthcare associated pneumonia/Pseudomonas UTI: He has completed treatment with IV cefepime.  DM (diabetes mellitus), type 2, uncontrolled (HCC) Cont. long-acting insulin plus sliding scale insulin.   Disposition: PT OT evaluated the patient and recommended skilled nursing facility for his BKA as well as his malnutrition. The patient has been in the hospital more than 3 days. Needs rehabilitation at least 5 days a week to facilitate daily activities and transition to home. Rehab is being requested as the most appropriate discharge option for the patient is not felt to be custodial care as evidenced by the patient's inability to perform ADLs and ambulate.    RN Pressure Injury Documentation:    Estimated body mass index is 17.28 kg/m as calculated from the following:   Height as of this encounter: 5\' 11"  (1.803 m).   Weight as of this encounter: 56.2 kg. Malnutrition Type:  Nutrition Problem: Severe Malnutrition Etiology: chronic illness(Pancreatic cancer, DM and CHF)   Malnutrition Characteristics:  Signs/Symptoms: severe muscle depletion, severe fat depletion, percent weight loss Percent weight loss: 23 %   Nutrition Interventions:  Interventions: Ensure Enlive (each supplement provides 350kcal and 20 grams of protein), Magic cup    DVT prophylaxis: Eliquis Family Communication:none Disposition Plan/Barrier to D/C: SNF when bed available Code Status:     Code Status Orders  (From admission, onward)         Start     Ordered   09/07/18 1850  Limited resuscitation (code)  Continuous     Question Answer Comment  In the event of cardiac or respiratory ARREST: Initiate Code Blue, Call Rapid Response No   In the event of cardiac or respiratory ARREST: Perform CPR No   In the event of cardiac or respiratory ARREST: Perform Intubation/Mechanical Ventilation Yes   In the event of cardiac or respiratory ARREST: Use NIPPV/BiPAp only if indicated No   In the event of cardiac or respiratory ARREST: Administer ACLS medications if indicated No   In the event of cardiac or respiratory ARREST: Perform Defibrillation or Cardioversion if indicated No   Comments Continue current care with mechanical ventilation and vasopressor support.      09/07/18 1850        Code Status History    Date Active Date Inactive Code Status Order ID Comments User Context   09/03/2018 2251 09/07/2018 1850 Full Code [id number]  [name], MD Inpatient   09/03/2018 2236 09/03/2018 2251 Full Code [id number]  Iline Oven Inpatient   08/22/2018 2203 08/25/2018 1655 Full Code [id number]  Ivor Costa, MD ED   01/30/2018 0308 01/31/2018 1807 Full Code [id number]  [name], MD ED   05/19/2016 1029 05/19/2016 1721 Full Code [id number]  Leonie Man, MD Inpatient        IV Access:    Peripheral IV   Procedures and diagnostic studies:   No results found.   Medical Consultants:    None.  Anti-Infectives:   None  Subjective:    Ryan Medina  Is ambulating in room with a walker, he denies pain, no new complaint,  He is tolerating his diet , denies ab pain, no bm today Awaiting for placement  Objective:    Vitals:   12/09/18 1355 12/09/18 2034 12/10/18 0413 12/10/18 1214  BP: (!) 114/102 (!) 116/97 (!) 109/97 (!) 116/99  Pulse: (!) 51 80 (!) 54 (!) 51  Resp: 16 16 16 20   Temp:  98.6 F (37 C) 98.3 F (36.8 C) 97.6 F (36.4 C)  TempSrc:  Oral Oral Oral  SpO2: 98% 100% 100% 100%  Weight:      Height:        Intake/Output Summary (Last 24 hours) at  12/10/2018 1412 Last data filed at 12/10/2018 0930 Gross per 24 hour  Intake 240 ml  Output -  Net 240 ml   Filed Weights   11/02/18 1806 11/04/18 0831 11/12/18 0742  Weight: 56.2 kg 56.2 kg 56.2 kg    Exam: General exam: In no acute distress. Thin,  + chest port Respiratory system: Good air movement and clear to auscultation. Cardiovascular system: S1 & S2 heard, RRR.  Gastrointestinal system: Abdomen is nondistended, soft and nontender.  Central nervous system: Alert and oriented. No focal neurological deficits. 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 Dressing intact Skin: No rashes, lesions or ulcers Psychiatry: Judgement and insight appear normal. Mood & affect appropriate.    Data Reviewed:    Labs: Basic Metabolic Panel: Recent Labs  Lab 12/04/18 1830 12/05/18 0349 12/06/18 0306 12/10/18 0350  NA 138 136 136  --   K 2.7* 3.7 4.1  --   CL 104 102 104  --   CO2 26 27 28   --   GLUCOSE 179* 306* 49*  --   BUN 8 9 11   --   CREATININE 0.57* 0.56* 0.44* 0.61  CALCIUM 8.2* 8.3* 8.4*  --    GFR Estimated Creatinine Clearance: 81 mL/min (by C-G formula based on SCr of 0.61 mg/dL). Liver Function Tests: No results for input(s): AST, ALT, ALKPHOS, BILITOT, PROT, ALBUMIN in the last 168 hours. No results for input(s): LIPASE, AMYLASE in the last 168 hours. No results for input(s): AMMONIA in the last 168 hours. Coagulation profile No results for input(s): INR, PROTIME in the last 168 hours.  CBC: Recent Labs  Lab 12/06/18 0306 12/06/18 1821 12/07/18 0453 12/07/18 1843 12/08/18 0427  WBC 11.1* 9.4 7.8 9.4 8.8  NEUTROABS  --  7.6  --   --   --   HGB 10.8* 10.5* 9.1* 10.6* 10.2*  HCT 33.8* 33.1* 29.0* 32.2* 32.0*  MCV 90.1 89.9 89.2 90.2 89.6  PLT 255 239 201 192 204   Cardiac Enzymes: No results for input(s): CKTOTAL, CKMB, CKMBINDEX, TROPONINI in the last 168 hours. BNP (last 3 results) No results for input(s): PROBNP in the last 8760 hours.  CBG: Recent Labs  Lab 12/09/18 1649 12/09/18 1808 12/09/18 2113 12/10/18 0714 12/10/18 1212  GLUCAP 69* 156* 150* 151* 72   D-Dimer: No results for input(s): DDIMER in the last 72 hours. Hgb A1c: No results for input(s): HGBA1C in the last 72 hours. Lipid Profile: No results for input(s): CHOL, HDL, LDLCALC, TRIG, CHOLHDL, LDLDIRECT in the last 72 hours. Thyroid function studies: No results for input(s): TSH, T4TOTAL, T3FREE, THYROIDAB in the last 72 hours.  Invalid input(s): FREET3 Anemia work up: No results for input(s): VITAMINB12, FOLATE, FERRITIN, TIBC, IRON, RETICCTPCT in the last 72 hours. Sepsis Labs: Recent Labs  Lab 12/06/18 1821 12/07/18 0453 12/07/18 1843 12/08/18 0427  WBC 9.4 7.8 9.4 8.8   Microbiology No results found for this or any previous visit (from the past 240  hour(s)).   Medications:   . docusate sodium  100 mg Oral Daily  . enoxaparin (LOVENOX) injection  1.5 mg/kg Subcutaneous Q24H  . feeding supplement (ENSURE ENLIVE)  237 mL Oral TID BM  . insulin aspart  0-5 Units Subcutaneous QHS  . insulin aspart  0-9 Units Subcutaneous TID WC  . insulin detemir  5 Units Subcutaneous Daily  . multivitamin with minerals  1 tablet Oral Daily  . pantoprazole  40 mg Oral BID  . polyethylene glycol  17 g Oral Daily  . sodium chloride flush  10-40 mL Intracatheter Q12H  . sucralfate  1 g Oral TID WC & HS   Continuous Infusions: . magnesium sulfate 1 - 4 g bolus IVPB         LOS: 98 days   Florencia Reasons  [facility]  12/10/2018, 2:12 PM

## 2018-12-11 LAB — CBC
HEMATOCRIT: 32.1 % — AB (ref 39.0–52.0)
Hemoglobin: 10.1 g/dL — ABNORMAL LOW (ref 13.0–17.0)
MCH: 28.1 pg (ref 26.0–34.0)
MCHC: 31.5 g/dL (ref 30.0–36.0)
MCV: 89.4 fL (ref 80.0–100.0)
Platelets: 230 10*3/uL (ref 150–400)
RBC: 3.59 MIL/uL — ABNORMAL LOW (ref 4.22–5.81)
RDW: 13.9 % (ref 11.5–15.5)
WBC: 8.1 10*3/uL (ref 4.0–10.5)
nRBC: 0 % (ref 0.0–0.2)

## 2018-12-11 LAB — GLUCOSE, CAPILLARY
Glucose-Capillary: 274 mg/dL — ABNORMAL HIGH (ref 70–99)
Glucose-Capillary: 203 mg/dL — ABNORMAL HIGH (ref 70–99)
Glucose-Capillary: 141 mg/dL — ABNORMAL HIGH (ref 70–99)
Glucose-Capillary: 231 mg/dL — ABNORMAL HIGH (ref 70–99)

## 2018-12-11 NOTE — Plan of Care (Addendum)
Pt calm and cooperative. Utilizing front wheel walker to able in hall. Gait steady and tolerated well. Frequent verbal contacts maintained. Pt awaiting visit from sister Ryan Medina tomorrow. Call light and phone in reach. Rounds continued per MD order and unit protocol/policy.  R BKA dressing changed per MD order. Pt tolerated well. Minimal amount of bloody drainage noted. Staples intact, medium size opening L inner stump. Monitoring continued per MD orders and unit policy.

## 2018-12-11 NOTE — Plan of Care (Signed)
  Problem: Pain Managment: Goal: General experience of comfort will improve Outcome: Progressing   

## 2018-12-11 NOTE — Progress Notes (Signed)
PROGRESS NOTE    Ryan Medina  UUV:[id number] DOB: 1961-07-30 DOA: 09/03/2018 PCP: Patient, No Pcp Per  Brief Narrative 58 y.o. male past medical history of diabetes mellitus, hypertension, systolic heart failure with an EF of 45%, polysubstance abuse who presented on 09/03/2018 with right leg pain was found to have an acute thromboembolic ischemic event taken emergent leading to embolectomy  Major Hospital events: - 09/03/2018 Right popliteal and tibial embolectomy due to ischemic right lower extremity. - 09/05/2018 Evacuation of hematoma and fasciotomy of the right leg due to bleeding from right leg wound. - 09/07/2018 EGD showing active bleeding ulcer. - 09/07/2018 Decompensated and intubated due to a duodenal ulcer with hemorrhage IR performed coiling. -  09/10/2018 Recurrent right lower extremity embolism requiring repeated leg embolectomy. -09/11/2018 CTA showed pancreatic mass with mural thrombus and dilated bilateral iliac arteries. - EUS biopsy of pancreatic cancer 09/16/2018. -  09/12/2019 Diagnosed with locally advanced pancreatic cancer incidentally. -Transferred to [facility] long hospital for radiation from 09/30/2020 10/13/2018 Port-A-Cath placed on 10/12/2018 for chemotherapy (has not been started pending stabilization) - PE on 08/27/2019 anticoagulation restarted. - 10/29/2018 repeated EGD for follow-up ulcer showed malignant pyloric stenosis and candidiasis 11/04/2018 open gastrojejunostomy was performed due to malignant pyloric stenosis. -09/12/2019 EGD repeated for hematochezia, no source of bleeding. 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 -CT scan of the abdomen and pelvis on 12/06/2018 showed no convincing acute findings stable appearing pancreatic mass, 2 small densities in the liver, and possible thrombosis of the inferior vena cava which may extend into the iliac and femoral veins   Assessment & Plan:   Principal Problem:   Popliteal artery embolus  (HCC) Active Problems:   DM (diabetes mellitus), type 2, uncontrolled ([place])   Essential hypertension   Dilated cardiomyopathy (HCC)   Chest pain   AAA (abdominal aortic aneurysm) (HCC)   Ischemic foot   Right leg claudication (HCC)   PVD (peripheral vascular disease) (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   Pancreatic adenocarcinoma ([place])   Palliative care by specialist   Protein-calorie malnutrition, severe   Abdominal distention   Pain   HCAP (healthcare-associated pneumonia)   Acute lower UTI   Sepsis ([place])   Homelessness   Weakness generalized   Gastric outflow obstruction   Hematochezia   BRBPR (bright red blood per rectum)   Acute gastrojejunal anastomotic ulcer   Gangrene of right foot ([place])   Portal vein thrombosis   DNR (do not resuscitate)   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 Patient needs oxycodone for pain.  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  Candida esophagitis: He has completed treatment.  Acute Pulmonary embolism/new portal vein thrombosis on 12/07/2018: The case was discussed with hematology oncology and the patient and patient refused Lovenox, the patient opted  for Eliquis which is  sub-optimal alternative. My colleague Dr Aileen Fass Discussed the case with oncology who recommended restart Lovenox, currently he is tolerating it.  Acute blood loss anemia on anemia of chronic disease/hematochezia/malignant duodenal ulcer: On a PPI and sucralfate, he has received several doses of Feraheme, currently on oral iron.  New lower GI bleed/abdominal pain: -CT scan showed no acute findings, like bowel obstruction, it showed stable pancreatic mass to small densities in the liver, it did show possible thrombosis of the inferior vena cava which may extend to the iliacs and femoral. -His abdominal pain is resolved, his Eliquis was held and he had 2 bloody bowel movements on 3.16.2020. -He has had no further bowel movements overnight.   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  Now that he is bleeding, a complicates the situation due to his poor prognoses and his new lower GI bleed it makes it difficult to start anticoagulation. We will have to re-consult hospice and palliative care as he is now decided to move to DNR/DNI  -my colleague Dr Aileen Fass have explained to patient his lifespan will less than 4 months - may need to  readdress end-of-life and prognoses with him. -Discussed the case with oncology and they recommended to start Lovenox and monitor him.   Chronic systolic and diastolic heart failure: Stable no signs of overload.  Sepsis from healthcare associated pneumonia/Pseudomonas UTI: He has completed treatment with IV cefepime.  DM (diabetes mellitus), type 2, uncontrolled (HCC) Cont. long-acting insulin plus sliding scale insulin.   Disposition: PT OT evaluated the patient and recommended skilled nursing facility for his BKA as well as his malnutrition. The patient has been in the hospital more than 3 days. Needs rehabilitation at least 5 days a week to facilitate  daily activities and transition to home. Rehab is being requested as the most appropriate discharge option for the patient is not felt to be custodial care as evidenced by the patient's inability to perform ADLs and ambulate.  DVT prophylaxis: Lovenox Family Communication:none Disposition Plan/Barrier to D/C: SNF when bed available Code Status:  DO NOT RESUSCITATE  Nutrition Problem: Severe Malnutrition Etiology: chronic illness(Pancreatic cancer, DM and CHF)     Signs/Symptoms: severe muscle depletion, severe fat depletion, percent weight loss Percent weight loss: 23 %    Interventions: Ensure Enlive (each supplement provides 350kcal and 20 grams of protein), Magic cup  Estimated body mass index is 17.28 kg/m as calculated from the following:   Height as of this encounter: 5\' 11"  (1.803 m).   Weight as of this encounter: 56.2 kg.   Subjective: Patient resting in bed no specific complaints today  Objective: Vitals:   12/10/18 1214 12/10/18 1900 12/11/18 0351 12/11/18 0415  BP: (!) 116/99 118/88 (!) 123/105 115/89  Pulse: (!) 51 97 (!) 43   Resp: 20 18 16    Temp: 97.6 F (36.4 C) 98.6 F (37 C) 98.6 F (37 C)   TempSrc: Oral Oral Oral   SpO2: 100% 100% 97%   Weight:      Height:        Intake/Output Summary (Last 24 hours) at 12/11/2018 0932 Last data filed at 12/11/2018 0900 Gross per 24 hour  Intake 730 ml  Output 550 ml  Net 180 ml   Filed Weights   11/02/18 1806 11/04/18 0831 11/12/18 0742  Weight: 56.2 kg 56.2 kg 56.2 kg    Examination:  General exam: Appears calm and comfortable  Respiratory system: Clear to auscultation. Respiratory effort normal. Cardiovascular system: S1 & S2 heard, RRR. No JVD, murmurs, rubs, gallops or clicks. No pedal edema. Gastrointestinal system: Abdomen is nondistended, soft and nontender. No organomegaly or masses felt. Normal bowel sounds heard. Central nervous system: Alert and oriented. No focal neurological deficits.  Extremities: Right BKA Skin: No rashes, lesions or ulcers Psychiatry: Judgement and insight appear normal. Mood & affect appropriate.     Data Reviewed: I have personally reviewed following labs and imaging studies  CBC: Recent Labs  Lab 12/06/18 1821 12/07/18 0453 12/07/18 1843 12/08/18 0427 12/11/18 0336  WBC 9.4 7.8 9.4 8.8 8.1  NEUTROABS 7.6  --   --   --   --   HGB 10.5* 9.1* 10.6* 10.2* 10.1*  HCT 33.1* 29.0* 32.2* 32.0* 32.1*  MCV 89.9 89.2 90.2 89.6 89.4  PLT 239 201 192 204 315   Basic Metabolic Panel: Recent Labs  Lab 12/04/18 1830 12/05/18 0349 12/06/18 0306 12/10/18 0350  NA 138 136 136  --   K 2.7* 3.7 4.1  --   CL 104 102 104  --   CO2 26 27 28   --   GLUCOSE 179* 306* 49*  --   BUN 8 9 11   --   CREATININE 0.57* 0.56* 0.44* 0.61  CALCIUM 8.2* 8.3* 8.4*  --    GFR: Estimated Creatinine Clearance: 81 mL/min (by C-G formula based on SCr of 0.61 mg/dL). Liver Function Tests: No results for input(s): AST, ALT, ALKPHOS, BILITOT, PROT, ALBUMIN in the last 168 hours. No results for input(s): LIPASE, AMYLASE in the last 168 hours. No results for input(s): AMMONIA in the last 168 hours. Coagulation Profile: No results for input(s): INR, PROTIME in the last 168 hours. Cardiac Enzymes: No results for input(s): CKTOTAL, CKMB, CKMBINDEX, TROPONINI in the last 168 hours. BNP (last 3 results) No results for input(s): PROBNP in the last 8760 hours. HbA1C: No results for input(s): HGBA1C in the last 72 hours. CBG: Recent Labs  Lab 12/10/18 0714 12/10/18 1212 12/10/18 1636 12/10/18 2108 12/11/18 0722  GLUCAP 151* 72 85 219* 274*   Lipid Profile: No results for input(s): CHOL, HDL, LDLCALC, TRIG, CHOLHDL, LDLDIRECT in the last 72 hours. Thyroid Function Tests: No results for input(s): TSH, T4TOTAL, FREET4, T3FREE, THYROIDAB in the last 72 hours. Anemia Panel: No results for input(s): VITAMINB12, FOLATE, FERRITIN, TIBC, IRON, RETICCTPCT in the last 72  hours. Sepsis Labs: No results for input(s): PROCALCITON, LATICACIDVEN in the last 168 hours.  No results found for this or any previous visit (from the past 240 hour(s)).       Radiology Studies: No results found.      Scheduled Meds: . docusate sodium  100 mg Oral Daily  . enoxaparin (LOVENOX) injection  1.5 mg/kg Subcutaneous Q24H  . feeding supplement (ENSURE ENLIVE)  237 mL Oral TID BM  . insulin aspart  0-5 Units Subcutaneous QHS  . insulin aspart  0-9 Units Subcutaneous TID WC  . insulin detemir  5 Units Subcutaneous Daily  . multivitamin with minerals  1 tablet Oral Daily  . pantoprazole  40 mg Oral BID  . polyethylene glycol  17 g Oral Daily  . sodium chloride flush  10-40 mL Intracatheter Q12H  . sucralfate  1 g Oral TID WC & HS   Continuous Infusions: . magnesium sulfate 1 - 4 g bolus IVPB       LOS: 99 days  Georgette Shell, MD [facility]  If 7PM-7AM, please contact night-coverage [email or web address] Password [facility] 12/11/2018, 9:32 AM

## 2018-12-12 LAB — GLUCOSE, CAPILLARY
Glucose-Capillary: 131 mg/dL — ABNORMAL HIGH (ref 70–99)
Glucose-Capillary: 39 mg/dL — CL (ref 70–99)
Glucose-Capillary: 89 mg/dL (ref 70–99)
Glucose-Capillary: 36 mg/dL — CL (ref 70–99)
Glucose-Capillary: 40 mg/dL — CL (ref 70–99)
Glucose-Capillary: 80 mg/dL (ref 70–99)
Glucose-Capillary: 190 mg/dL — ABNORMAL HIGH (ref 70–99)

## 2018-12-12 MED ORDER — GLUCOSE 40 % PO GEL
ORAL | Status: AC
  Administered 2018-12-12: 13:00:00
  Filled 2018-12-12: qty 1

## 2018-12-12 NOTE — Plan of Care (Signed)
  Problem: Pain Managment: Goal: General experience of comfort will improve Outcome: Progressing   Problem: Skin Integrity: Goal: Demonstration of wound healing without infection will improve Outcome: Progressing

## 2018-12-12 NOTE — Progress Notes (Signed)
During routine evening central line check, it was noted port a cath dressing falling off. Needle due to be changed tomorrow, so reaccessed port this evening and new dressing placed. Site clean, dry, and intact. Will continue to monitor.

## 2018-12-12 NOTE — Progress Notes (Signed)
Pt's blood sugar 49.  Pt given a coke, graham crackers, ice cream, and oral glucose.  Pt's blood sugar reassessed at 1227.  Pt's blood sugar is 89.  Pt encouraged to eat lunch.  Will continue to monitor pt.

## 2018-12-12 NOTE — Progress Notes (Signed)
PROGRESS NOTE    Ryan Medina  TOI:[id number] DOB: June 10, 1961 DOA: 09/03/2018 PCP: Patient, No Pcp Per   Brief Narrative:57 y.o.malepast medical history of diabetes mellitus, hypertension, systolic heart failure with an EF of 45%, polysubstance abuse who presented on 09/03/2018 with right leg pain was found to have an acute thromboembolic ischemic event taken emergent leading to embolectomy  Major Hospital events: - 12/13/2019Right popliteal and tibial embolectomy due to ischemic right lower extremity. - 12/15/2019Evacuation of hematoma and fasciotomy of the right leg due to bleeding from right leg wound. - 09/07/2018 EGD showing active bleeding ulcer. - 09/07/2018 Decompensated and intubated due to a duodenal ulcer with hemorrhage IR performed coiling. - 09/10/2018 Recurrent right lower extremity embolism requiring repeated leg embolectomy. -09/11/2018 CTA showed pancreatic mass with mural thrombus and dilated bilateral iliac arteries. - EUS biopsy of pancreatic cancer 09/16/2018. - 09/12/2019 Diagnosed with locally advanced pancreatic cancer incidentally. -Transferred to [facility] long hospital for radiation from 09/30/2020 10/13/2018 Port-A-Cath placed on 10/12/2018 for chemotherapy (has not been started pending stabilization) - PE on 08/27/2019 anticoagulation restarted. - 10/29/2018 repeated EGD for follow-up ulcer showed malignant pyloric stenosis and candidiasis 11/04/2018 open gastrojejunostomy was performed due to malignant pyloric stenosis. -09/12/2019 EGD repeated for hematochezia, no source of bleeding. 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  CT scan of the abdomen and pelvis on 12/06/2018 showed no convincing acute findings stable appearing pancreatic mass, 2 small densities in the liver, and possible thrombosis of the inferior vena cava which may extend into the iliac and femoral veins  Assessment & Plan:   Principal Problem:   Popliteal artery embolus  (HCC) Active Problems:   DM (diabetes mellitus), type 2, uncontrolled ([place])   Essential hypertension   Dilated cardiomyopathy (HCC)   Chest pain   AAA (abdominal aortic aneurysm) (HCC)   Ischemic foot   Right leg claudication (HCC)   PVD (peripheral vascular disease) ([place])   Hemorrhagic shock ([place])   Nontraumatic ischemic infarction of muscle of right lower leg   Upper GI bleed   Cocaine abuse ([place])   Acute blood loss anemia   Chronic pain syndrome   Duodenal ulcer with hemorrhage   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)   Goals of care, counseling/discussion   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   Pancreatic adenocarcinoma ([place])   Palliative care by specialist   Protein-calorie malnutrition, severe   Abdominal distention   Pain   HCAP (healthcare-associated pneumonia)   Acute lower UTI   Sepsis ([place])   Homelessness   Weakness generalized   Gastric outflow obstruction   Hematochezia   BRBPR (bright red blood per rectum)   Acute gastrojejunal anastomotic ulcer   Gangrene of right foot ([place])   Portal vein thrombosis   DNR (do not resuscitate)   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 Patient needs oxycodone for pain.  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  Candida esophagitis: He has completed treatment.  Acute Pulmonary embolism/new portal vein thrombosis on 12/07/2018: The case was discussed with hematology oncology and the patient and patient refused Lovenox, the patient opted for Eliquis which is  sub-optimal  alternative. My colleague Dr Aileen Fass Discussed the case with oncology who recommended restart Lovenox, currently he is tolerating it.   Acute blood loss anemia on anemia of chronic disease/hematochezia/malignant duodenal ulcer: On a PPI and sucralfate, he has received several doses of Feraheme, currently on oral iron.  New lower GI bleed/abdominal pain: -CT scan showed no acute findings, like bowel obstruction, it showed stable pancreatic mass to small densities in the liver, it did show possible thrombosis of the inferior vena cava which may extend to the iliacs and femoral. -His abdominal pain is resolved, his Eliquis was held and he had 2 bloody bowel movements on 3.16.2020. -He has had no further bowel movements overnight.  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 Now that he is bleeding, a complicates the situation due to his poor prognoses and his new lower GI bleed it makes it difficult to start anticoagulation. We will have to re-consult hospice and palliative care as he is now decided to move to DNR/DNI  -my colleague Dr Aileen Fass have explained to patient his lifespan will less than 4 months - may need to readdress end-of-life and prognoses with him. -Discussed the case with oncology and they recommended to start Lovenox and monitor him. Chronic systolic and diastolic heart failure: Stable no signs of overload.  Sepsis from healthcare associated pneumonia/Pseudomonas UTI: He has completed treatment with IV cefepime.  DM (diabetes mellitus), type 2, uncontrolled (HCC) Cont. long-acting insulin plus sliding scale insulin.   Disposition: PT OT evaluated the patient and recommended skilled nursing facility for his BKA as well as his malnutrition. The patient has been in the hospital more than 3 days. Needs rehabilitation at least 5 days a week to facilitate  daily activities and transition to home. Rehab is being requested as the most appropriate discharge option for the patient is not felt to be custodial care as evidenced by the patient's inability to perform ADLs and ambulate.  DVT prophylaxis: Lovenox Family Communication:none Disposition Plan/Barrier to D/C:SNF when bed available Code Status: DO NOT RESUSCITATE   Nutrition Problem: Severe Malnutrition Etiology: chronic illness(Pancreatic cancer, DM and CHF)     Signs/Symptoms: severe muscle depletion, severe fat depletion, percent weight loss Percent weight loss: 23 %    Interventions: Ensure Enlive (each supplement provides 350kcal and 20 grams of protein), Magic cup  Estimated body mass index is 17.28 kg/m as calculated from the following:   Height as of this encounter: 5\' 11"  (1.803 m).   Weight as of this encounter: 56.2 kg.   Subjective: Patient resting in bed no complaints  Objective: Vitals:   12/11/18 1345 12/11/18 2132 12/12/18 0404 12/12/18 0513  BP: 113/85 (!) 112/91 110/88 112/85  Pulse: 95 (!) 102 64 61  Resp: 16 16 16 18   Temp: 97.9 F (36.6 C) 98.6 F (37 C) 99.3 F (37.4 C) 100 F (37.8 C)  TempSrc: Oral Oral Oral Oral  SpO2: 100% 98% 100% 98%  Weight:      Height:        Intake/Output Summary (Last 24 hours) at 12/12/2018 0924 Last data filed at 12/11/2018 2300 Gross per 24 hour  Intake 850 ml  Output 1100 ml  Net -250 ml   Filed Weights   11/02/18 1806 11/04/18 0831 11/12/18 0742  Weight: 56.2 kg 56.2 kg 56.2 kg    Examination:  General exam: Appears calm and comfortable  Respiratory system: Clear to  auscultation. Respiratory effort normal. Cardiovascular system: S1 & S2 heard, RRR. No JVD, murmurs, rubs, gallops or clicks. No pedal edema. Gastrointestinal system: Abdomen is nondistended, soft and nontender. No organomegaly or masses felt. Normal bowel sounds heard. Central nervous system: Alert and oriented. No focal neurological  deficits. Extremities: Right BKA  skin: No rashes, lesions or ulcers Psychiatry: Judgement and insight appear normal. Mood & affect appropriate.     Data Reviewed: I have personally reviewed following labs and imaging studies  CBC: Recent Labs  Lab 12/06/18 1821 12/07/18 0453 12/07/18 1843 12/08/18 0427 12/11/18 0336  WBC 9.4 7.8 9.4 8.8 8.1  NEUTROABS 7.6  --   --   --   --   HGB 10.5* 9.1* 10.6* 10.2* 10.1*  HCT 33.1* 29.0* 32.2* 32.0* 32.1*  MCV 89.9 89.2 90.2 89.6 89.4  PLT 239 201 192 204 532   Basic Metabolic Panel: Recent Labs  Lab 12/06/18 0306 12/10/18 0350  NA 136  --   K 4.1  --   CL 104  --   CO2 28  --   GLUCOSE 49*  --   BUN 11  --   CREATININE 0.44* 0.61  CALCIUM 8.4*  --    GFR: Estimated Creatinine Clearance: 81 mL/min (by C-G formula based on SCr of 0.61 mg/dL). Liver Function Tests: No results for input(s): AST, ALT, ALKPHOS, BILITOT, PROT, ALBUMIN in the last 168 hours. No results for input(s): LIPASE, AMYLASE in the last 168 hours. No results for input(s): AMMONIA in the last 168 hours. Coagulation Profile: No results for input(s): INR, PROTIME in the last 168 hours. Cardiac Enzymes: No results for input(s): CKTOTAL, CKMB, CKMBINDEX, TROPONINI in the last 168 hours. BNP (last 3 results) No results for input(s): PROBNP in the last 8760 hours. HbA1C: No results for input(s): HGBA1C in the last 72 hours. CBG: Recent Labs  Lab 12/11/18 0722 12/11/18 1137 12/11/18 1638 12/11/18 2119 12/12/18 0749  GLUCAP 274* 203* 141* 231* 131*   Lipid Profile: No results for input(s): CHOL, HDL, LDLCALC, TRIG, CHOLHDL, LDLDIRECT in the last 72 hours. Thyroid Function Tests: No results for input(s): TSH, T4TOTAL, FREET4, T3FREE, THYROIDAB in the last 72 hours. Anemia Panel: No results for input(s): VITAMINB12, FOLATE, FERRITIN, TIBC, IRON, RETICCTPCT in the last 72 hours. Sepsis Labs: No results for input(s): PROCALCITON, LATICACIDVEN in the last  168 hours.  No results found for this or any previous visit (from the past 240 hour(s)).       Radiology Studies: No results found.      Scheduled Meds: . docusate sodium  100 mg Oral Daily  . enoxaparin (LOVENOX) injection  1.5 mg/kg Subcutaneous Q24H  . feeding supplement (ENSURE ENLIVE)  237 mL Oral TID BM  . insulin aspart  0-5 Units Subcutaneous QHS  . insulin aspart  0-9 Units Subcutaneous TID WC  . insulin detemir  5 Units Subcutaneous Daily  . multivitamin with minerals  1 tablet Oral Daily  . pantoprazole  40 mg Oral BID  . polyethylene glycol  17 g Oral Daily  . sodium chloride flush  10-40 mL Intracatheter Q12H  . sucralfate  1 g Oral TID WC & HS   Continuous Infusions: . magnesium sulfate 1 - 4 g bolus IVPB       LOS: 100 days     Georgette Shell, MD [facility]  If 7PM-7AM, please contact night-coverage [email or web address] Password [facility] 12/12/2018, 9:24 AM

## 2018-12-12 NOTE — Progress Notes (Signed)
Pt's dinner time blood sugar 40.  Pt given a can of coke to drink and graham crackers.  Pt's blood sugar rechecked at 1705.  Pt's blood sugar was 80.  Pt encouraged to order dinner, but pt refused to order dinner.  Pt stated, " I've ate everything here and I don't want any of the food here".  Pt instructed to have family bring him some food, but he refused.  Will continue to monitor.

## 2018-12-12 NOTE — Plan of Care (Signed)
  Problem: Skin Integrity: Goal: Demonstration of wound healing without infection will improve Outcome: Progressing   

## 2018-12-13 DIAGNOSIS — Z980 Intestinal bypass and anastomosis status: Secondary | ICD-10-CM

## 2018-12-13 DIAGNOSIS — K311 Adult hypertrophic pyloric stenosis: Secondary | ICD-10-CM | POA: Diagnosis not present

## 2018-12-13 DIAGNOSIS — S88111A Complete traumatic amputation at level between knee and ankle, right lower leg, initial encounter: Secondary | ICD-10-CM | POA: Diagnosis not present

## 2018-12-13 DIAGNOSIS — I2699 Other pulmonary embolism without acute cor pulmonale: Secondary | ICD-10-CM | POA: Diagnosis not present

## 2018-12-13 LAB — BASIC METABOLIC PANEL
ANION GAP: 9 (ref 5–15)
BUN: 14 mg/dL (ref 6–20)
CO2: 25 mmol/L (ref 22–32)
Calcium: 8.2 mg/dL — ABNORMAL LOW (ref 8.9–10.3)
Chloride: 99 mmol/L (ref 98–111)
Creatinine, Ser: 0.57 mg/dL — ABNORMAL LOW (ref 0.61–1.24)
GFR calc Af Amer: >60 mL/min (ref >60)
GFR calc non Af Amer: >60 mL/min (ref >60)
Glucose, Bld: 167 mg/dL — ABNORMAL HIGH (ref 70–99)
Potassium: 3.6 mmol/L (ref 3.5–5.1)
Sodium: 133 mmol/L — ABNORMAL LOW (ref 135–145)

## 2018-12-13 LAB — GLUCOSE, CAPILLARY
Glucose-Capillary: 41 mg/dL — CL (ref 70–99)
Glucose-Capillary: 239 mg/dL — ABNORMAL HIGH (ref 70–99)
Glucose-Capillary: 180 mg/dL — ABNORMAL HIGH (ref 70–99)
Glucose-Capillary: 74 mg/dL (ref 70–99)
Glucose-Capillary: 79 mg/dL (ref 70–99)

## 2018-12-13 MED ORDER — CYCLOBENZAPRINE HCL 10 MG PO TABS
5.0000 mg | ORAL_TABLET | Freq: Once | ORAL | Status: AC
  Administered 2018-12-13: 5 mg via ORAL
  Filled 2018-12-13: qty 1

## 2018-12-13 NOTE — Progress Notes (Signed)
Dressing to Rt stump changed. Patient c/o increased pain to stump. Wound appears bigger and  needs more packing. MD notified and order received for wound care consult.

## 2018-12-13 NOTE — Progress Notes (Addendum)
PROGRESS NOTE    Ryan Medina  OEV:[id number] DOB: 01-Feb-1961 DOA: 09/03/2018 PCP: Patient, No Pcp Per   Brief Narrative:58 y.o.malepast medical history of diabetes mellitus, hypertension, systolic heart failure with an EF of 45%, polysubstance abuse who presented on 09/03/2018 with right leg pain was found to have an acute thromboembolic ischemic event taken emergent leading to embolectomy  Major Hospital events: - 12/13/2019Right popliteal and tibial embolectomy due to ischemic right lower extremity. - 12/15/2019Evacuation of hematoma and fasciotomy of the right leg due to bleeding from right leg wound. - 09/07/2018 EGD showing active bleeding ulcer. - 09/07/2018 Decompensated and intubated due to a duodenal ulcer with hemorrhage IR performed coiling. - 09/10/2018 Recurrent right lower extremity embolism requiring repeated leg embolectomy. -09/11/2018 CTA showed pancreatic mass with mural thrombus and dilated bilateral iliac arteries. - EUS biopsy of pancreatic cancer 09/16/2018. - 09/12/2019 Diagnosed with locally advanced pancreatic cancer incidentally. -Transferred to [facility] long hospital for radiation from 09/30/2020 10/13/2018 Port-A-Cath placed on 10/12/2018 for chemotherapy (has not been started pending stabilization) - PE on 08/27/2019 anticoagulation restarted. - 10/29/2018 repeated EGD for follow-up ulcer showed malignant pyloric stenosis and candidiasis 11/04/2018 open gastrojejunostomy was performed due to malignant pyloric stenosis. -09/12/2019 EGD repeated for hematochezia, no source of bleeding. 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 - 12/06/18 CT scan of the abdomen and pelvis showed no convincing acute findings stable pancreatic mass, 2 small densities in the liver, and possible thrombosis of the inferior vena cava which may extend into the iliac and femoral veins  Assessment & Plan:   Principal Problem:   Popliteal artery embolus (HCC) Active  Problems:   DM (diabetes mellitus), type 2, uncontrolled ([place])   Essential hypertension   Dilated cardiomyopathy (HCC)   Chest pain   AAA (abdominal aortic aneurysm) (HCC)   Ischemic foot   Right leg claudication (HCC)   PVD (peripheral vascular disease) ([place])   Hemorrhagic shock ([place])   Nontraumatic ischemic infarction of muscle of right lower leg   Upper GI bleed   Cocaine abuse ([place])   Acute blood loss anemia   Chronic pain syndrome   Duodenal ulcer with hemorrhage   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)   Goals of care, counseling/discussion   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   Pancreatic adenocarcinoma ([place])   Palliative care by specialist   Protein-calorie malnutrition, severe   Abdominal distention   Pain   HCAP (healthcare-associated pneumonia)   Acute lower UTI   Sepsis ([place])   Homelessness   Weakness generalized   Gastric outflow obstruction   Hematochezia   BRBPR (bright red blood per rectum)   Acute gastrojejunal anastomotic ulcer   Gangrene of right foot ([place])   Portal vein thrombosis   DNR (do not resuscitate)   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 Patient needs oxycodone for pain - has open wound, packing wet to dry daily, VVS following, appreciate assistance  Gastric outlet obstruction due to malignant pyloric stenosis/severe protein malnutrition/status post gastrojejunostomy 11/04/18: No changes, nutrition recommendations have been followed. 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  Acute Pulmonary embolism/new portal vein thrombosis on 12/07/2018: The case was discussed with hematology oncology and patient refused Lovenox, the patient opted for Eliquis which is sub-optimal alternative.  Dr Aileen Fass discussed the case with oncology who recommended restart Lovenox, currently he is tolerating it at full dose = 85mg  /day - in setting of recurrent GIB, per ONC Dr Marin Olp 3/17 note, another option would be to just stop the anticoagulation altogether, pt is DNR, has incurable disease, it might improve his QOL. Something to think about.    Acute blood loss anemia on anemia of chronic disease/hematochezia/malignant duodenal ulcer: On a PPI and sucralfate, he has received several doses of Feraheme, currently on oral iron.  New lower GI bleed/abdominal pain: around 12/06/18... -CT scan 3/16 showed no acute findings, like bowel obstruction, it showed stable pancreatic mass to small densities in the liver, it did show possible thrombosis of the inferior vena cava which may extend to the iliacs and femoral. -His abdominal pain is resolved, his Eliquis was held and he had 2 bloody bowel movements - have explained to patient his lifespan will less than 4 months -Discussed the case with oncology and they recommended to start Lovenox and also see above other suggestions  Chronic systolic and diastolic heart failure: Stable no signs of overload.  Sepsis from healthcare associated pneumonia/Pseudomonas UTI: He has completed treatment with IV cefepime.  DM (diabetes mellitus), type 2, uncontrolled (HCC) Cont. long-acting insulin plus sliding scale insulin.   Disposition: PT OT evaluated the patient and recommended skilled nursing facility for his BKA as well as his malnutrition. Needs rehabilitation at least 5 days a week to facilitate daily activities and transition to home. Rehab is being requested  as the most appropriate discharge option for the patient is not felt to be custodial care as evidenced by the patient's inability to perform ADLs and ambulate.  DVT prophylaxis: Lovenox Family Communication: none Disposition Plan/Barrier to D/C:SNF when bed available Code Status: DO NOT RESUSCITATE  Kelly Splinter / [facility] [phone] 12/13/2018, 2:49 PM    Subjective: Patient resting in bed no complaints  Objective: Vitals:   12/12/18 0513 12/12/18 1511 12/12/18 2046 12/13/18 0442  BP: 112/85 92/72 105/77 104/87  Pulse: 61 (!) 113 62 95  Resp: 18 17 16 18   Temp: 100 F (37.8 C) 99.3 F (37.4 C) 99.9 F (37.7 C) 98.4 F (36.9 C)  TempSrc: Oral Oral Oral Oral  SpO2: 98% 97% 100% 100%  Weight:      Height:        Intake/Output Summary (Last 24 hours) at 12/13/2018 1434 Last data filed at 12/13/2018 0900 Gross per 24 hour  Intake 420 ml  Output 850 ml  Net -430 ml   Filed Weights   11/02/18 1806 11/04/18 0831 11/12/18 0742  Weight: 56.2 kg 56.2 kg 56.2 kg    Examination:  General exam: Appears calm and comfortable  Respiratory system: Clear to auscultation. Respiratory effort normal. Cardiovascular system: S1 & S2 heard, RRR. No JVD, murmurs, rubs, gallops or clicks. No pedal edema. Gastrointestinal system: Abdomen is nondistended, soft and nontender. No organomegaly or masses felt. Normal bowel sounds heard. Central nervous system: Alert and oriented. No focal neurological deficits. Extremities: Right BKA w/ open wound packed 2x2 cm medially skin: No rashes, lesions or ulcers Psychiatry: Judgement and insight appear normal. Mood & affect appropriate.  Data Reviewed: I have personally reviewed following labs and imaging studies  CBC: Recent Labs  Lab 12/06/18 1821 12/07/18 0453 12/07/18 1843 12/08/18 0427 12/11/18 0336  WBC 9.4 7.8 9.4 8.8 8.1  NEUTROABS 7.6  --   --   --   --   HGB 10.5* 9.1* 10.6* 10.2* 10.1*  HCT 33.1* 29.0* 32.2* 32.0* 32.1*  MCV  89.9 89.2 90.2 89.6 89.4  PLT 239 201 192 204 295   Basic Metabolic Panel: Recent Labs  Lab 12/10/18 0350 12/13/18 0319  NA  --  133*  K  --  3.6  CL  --  99  CO2  --  25  GLUCOSE  --  167*  BUN  --  14  CREATININE 0.61 0.57*  CALCIUM  --  8.2*   GFR: Estimated Creatinine Clearance: 81 mL/min (A) (by C-G formula based on SCr of 0.57 mg/dL (L)). Liver Function Tests: No results for input(s): AST, ALT, ALKPHOS, BILITOT, PROT, ALBUMIN in the last 168 hours. No results for input(s): LIPASE, AMYLASE in the last 168 hours. No results for input(s): AMMONIA in the last 168 hours. Coagulation Profile: No results for input(s): INR, PROTIME in the last 168 hours. Cardiac Enzymes: No results for input(s): CKTOTAL, CKMB, CKMBINDEX, TROPONINI in the last 168 hours. BNP (last 3 results) No results for input(s): PROBNP in the last 8760 hours. HbA1C: No results for input(s): HGBA1C in the last 72 hours. CBG: Recent Labs  Lab 12/12/18 1612 12/12/18 1705 12/12/18 2033 12/13/18 0903 12/13/18 1130  GLUCAP 40* 80 190* 239* 180*   Lipid Profile: No results for input(s): CHOL, HDL, LDLCALC, TRIG, CHOLHDL, LDLDIRECT in the last 72 hours. Thyroid Function Tests: No results for input(s): TSH, T4TOTAL, FREET4, T3FREE, THYROIDAB in the last 72 hours. Anemia Panel: No results for input(s): VITAMINB12, FOLATE, FERRITIN, TIBC, IRON, RETICCTPCT in the last 72 hours. Sepsis Labs: No results for input(s): PROCALCITON, LATICACIDVEN in the last 168 hours.  No results found for this or any previous visit (from the past 240 hour(s)).       Radiology Studies: No results found.      Scheduled Meds: . docusate sodium  100 mg Oral Daily  . enoxaparin (LOVENOX) injection  1.5 mg/kg Subcutaneous Q24H  . feeding supplement (ENSURE ENLIVE)  237 mL Oral TID BM  . insulin aspart  0-5 Units Subcutaneous QHS  . insulin aspart  0-9 Units Subcutaneous TID WC  . insulin detemir  5 Units Subcutaneous  Daily  . multivitamin with minerals  1 tablet Oral Daily  . pantoprazole  40 mg Oral BID  . polyethylene glycol  17 g Oral Daily  . sodium chloride flush  10-40 mL Intracatheter Q12H  . sucralfate  1 g Oral TID WC & HS   Continuous Infusions: . magnesium sulfate 1 - 4 g bolus IVPB       LOS: 101 days    If 7PM-7AM, please contact night-coverage [email or web address] Password [facility] 12/13/2018, 2:34 PM

## 2018-12-13 NOTE — Progress Notes (Signed)
PT Cancellation Note  Patient Details Name: Ryan Medina MRN: [id number] DOB: [birth year]   Cancelled Treatment:    Reason Eval/Treat Not Completed: Patient declined, no reason specified Patient declined participating in PT at this time. Frequency updated to 1X week. PT will continue to follow acutely.    [place]ina April, PTA Acute Rehabilitation Services Pager: [phone] Office: [phone]   12/13/2018, 11:39 AM

## 2018-12-13 NOTE — Progress Notes (Addendum)
Inpatient Diabetes Program Recommendations  AACE/ADA: New Consensus Statement on Inpatient Glycemic Control (2015)  Target Ranges:  Prepandial:   less than 140 mg/dL      Peak postprandial:   less than 180 mg/dL (1-2 hours)      Critically ill patients:  140 - 180 mg/dL   Results for HONG, MORING (MRN [id number]) as of 12/13/2018 11:37  Ref. Range 12/12/2018 07:49 12/12/2018 11:40 12/12/2018 11:43 12/12/2018 12:27 12/12/2018 16:10 12/12/2018 16:12 12/12/2018 17:05 12/12/2018 20:33 12/13/2018 09:03  Glucose-Capillary Latest Ref Range: 70 - 99 mg/dL 131 (H) 41 (LL) 39 (LL) 89 36 (LL) 40 (LL) 80 190 (H) 239 (H)    Review of Glycemic Control  Diabetes history: DM 2 Outpatient Diabetes medications:  Levemir 30 units qam, 20 units qpm, Novolog 0-15 units tid, Metformin 1000 mg BID  Current orders for Inpatient glycemic control:  Levemir 5 units Daily, Novolog 0-9 units tid, Novolog 0-5 units qhs  Inpatient Diabetes Program Recommendations:  Based on glucose trends. Patient seems to be very sensitive to Novolog insulin. Please consider d/cing Novolog 0-5 units hs coverage and decrease correction scale to a Custom Novolog Correction starting at 150 mg/dl.  -Custom Novolog correction scale 0-5 units       151-200  1 unit      201-250  2 units      251-300  3 units      301-350  4 units      351-400  5 units  Thanks,  Tama Headings RN, MSN, BC-ADM Inpatient Diabetes Coordinator Team Pager [phone] (8a-5p)

## 2018-12-13 NOTE — Plan of Care (Signed)
  Problem: Pain Managment: Goal: General experience of comfort will improve Outcome: Progressing   Problem: Clinical Measurements: Goal: Ability to maintain clinical measurements within normal limits will improve Outcome: Progressing   Problem: Skin Integrity: Goal: Demonstration of wound healing without infection will improve Outcome: Progressing

## 2018-12-14 DIAGNOSIS — S88111A Complete traumatic amputation at level between knee and ankle, right lower leg, initial encounter: Secondary | ICD-10-CM

## 2018-12-14 LAB — CBC
HCT: 30.1 % — ABNORMAL LOW (ref 39.0–52.0)
Hemoglobin: 9.7 g/dL — ABNORMAL LOW (ref 13.0–17.0)
MCH: 29.2 pg (ref 26.0–34.0)
MCHC: 32.2 g/dL (ref 30.0–36.0)
MCV: 90.7 fL (ref 80.0–100.0)
Platelets: 237 10*3/uL (ref 150–400)
RBC: 3.32 MIL/uL — ABNORMAL LOW (ref 4.22–5.81)
RDW: 13.8 % (ref 11.5–15.5)
WBC: 7.8 10*3/uL (ref 4.0–10.5)
nRBC: 0 % (ref 0.0–0.2)

## 2018-12-14 LAB — GLUCOSE, CAPILLARY
Glucose-Capillary: 176 mg/dL — ABNORMAL HIGH (ref 70–99)
Glucose-Capillary: 87 mg/dL (ref 70–99)
Glucose-Capillary: 150 mg/dL — ABNORMAL HIGH (ref 70–99)
Glucose-Capillary: 258 mg/dL — ABNORMAL HIGH (ref 70–99)
Glucose-Capillary: 113 mg/dL — ABNORMAL HIGH (ref 70–99)

## 2018-12-14 MED ORDER — CYCLOBENZAPRINE HCL 10 MG PO TABS
5.0000 mg | ORAL_TABLET | Freq: Every day | ORAL | Status: DC
  Administered 2018-12-14: 5 mg via ORAL
  Filled 2018-12-14: qty 1

## 2018-12-14 MED ORDER — CYCLOBENZAPRINE HCL 10 MG PO TABS
5.0000 mg | ORAL_TABLET | Freq: Once | ORAL | Status: AC
  Administered 2018-12-14: 5 mg via ORAL
  Filled 2018-12-14: qty 1

## 2018-12-14 NOTE — Consult Note (Signed)
WOC consulted for stump wound, however after review of the chart and the photos it is noted that [facility] is following this patient and has written wound care orders. If concerns over worsening of the wound, would need to contact the surgeon who is following this patient and has direction of the POC for the wound.   Bedside nurse notified of same.    Re consult if needed, will not follow at this time. Thanks  Terris Germano [employer], RN,CWOCN, CNS, [place] [phone])

## 2018-12-14 NOTE — Plan of Care (Signed)
  Problem: Pain Managment: Goal: General experience of comfort will improve Outcome: Progressing   Problem: Clinical Measurements: Goal: Ability to maintain clinical measurements within normal limits will improve Outcome: Progressing   Problem: Skin Integrity: Goal: Demonstration of wound healing without infection will improve Outcome: Progressing   Problem: Metabolic: Goal: Ability to maintain appropriate glucose levels will improve Outcome: Progressing

## 2018-12-14 NOTE — Progress Notes (Signed)
Occupational Therapy Discharge Patient Details Name: Ryan Medina MRN: [id number] DOB: 1961-06-28 Today's Date: 12/14/2018 Time: 1040-4591 OT Time Calculation (min): 9 min  Patient discharged from OT services secondary to goals met and no further OT needs identified. Pt has demonstrated understanding of home exercise program and increased strength to perform ADLs. No further OT session needed at this time.   Please see latest therapy progress note for current level of functioning and progress toward goals.    Progress and discharge plan discussed with patient and/or caregiver: Patient/Caregiver agrees with plan  [place], MSOT, OTR/L  [facility] Rehabilitation Services  [phone]     Marius Ditch 12/14/2018, 2:41 PM

## 2018-12-14 NOTE — Progress Notes (Signed)
Stump dressing changed per MD order.

## 2018-12-14 NOTE — Progress Notes (Signed)
PROGRESS NOTE    Ryan Medina  MWU:[id number] DOB: 07/05/1961 DOA: 09/03/2018 PCP: Patient, No Pcp Per   Brief Narrative:58 y.o.malepast medical history of diabetes mellitus, hypertension, systolic heart failure with an EF of 45%, polysubstance abuse who presented on 09/03/2018 with right leg pain was found to have an acute thromboembolic ischemic event taken emergent leading to embolectomy  Major Hospital events: - 12/13/2019Right popliteal and tibial embolectomy due to ischemic right lower extremity. - 12/15/2019Evacuation of hematoma and fasciotomy of the right leg due to bleeding from right leg wound. - 09/07/2018 EGD showing active bleeding ulcer. - 09/07/2018 Decompensated and intubated due to a duodenal ulcer with hemorrhage IR performed coiling. - 09/10/2018 Recurrent right lower extremity embolism requiring repeated leg embolectomy. -09/11/2018 CTA showed pancreatic mass with mural thrombus and dilated bilateral iliac arteries. - EUS biopsy of pancreatic cancer 09/16/2018. - 09/12/2019 Diagnosed with locally advanced pancreatic cancer incidentally. -Transferred to [facility] for radiation from 09/30/2020 10/13/2018 Port-A-Cath placed on 10/12/2018 for chemotherapy (has not been started pending stabilization) - PE on 08/27/2019 anticoagulation restarted. - 10/29/2018 repeated EGD for follow-up ulcer showed malignant pyloric stenosis and candidiasis 11/04/2018 open gastrojejunostomy was performed due to malignant pyloric stenosis. -09/12/2019 EGD repeated for hematochezia, no source of bleeding. 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 - 12/06/18 CT scan of the abdomen and pelvis showed no convincing acute findings stable pancreatic mass, 2 small densities in the liver, and possible thrombosis of the inferior vena cava which may extend into the iliac and femoral veins  Assessment & Plan:   Principal Problem:   Popliteal artery embolus (HCC) Active  Problems:   Pancreatic adenocarcinoma ([place])   Portal vein thrombosis   DNR (do not resuscitate)   Pulmonary embolus (HCC)   Unilateral complete BKA, right, initial encounter ([place])   Gastric outlet obstruction   H/O bypass gastrojejunostomy, open for malignant pyloric stenosis   DM (diabetes mellitus), type 2, uncontrolled ([place])   Essential hypertension   PVD (peripheral vascular disease) ([place])   Nontraumatic ischemic infarction of muscle of right lower leg   Acute blood loss anemia   Duodenal ulcer with hemorrhage   Palliative care by specialist   Protein-calorie malnutrition, severe   Homelessness   Chronic pain syndrome   Dilated cardiomyopathy ([place])   AAA (abdominal aortic aneurysm) (HCC)   Cocaine abuse ([place])   Goals of care, counseling/discussion   Acute lower UTI   BRBPR (bright red blood per rectum)   Acute gastrojejunal anastomotic ulcer   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 Patient needs oxycodone for pain - has open wound, packing wet to dry daily, [facility]VS following, appreciate assistance  Gastric outlet obstruction due to malignant pyloric stenosis/severe protein malnutrition/status post gastrojejunostomy 11/04/18: No changes, nutrition recommendations have been followed. 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: - this was diagnosed here w/ EUS biopsy done on 12/26 and the likely cause of his thrombotic tendencies - had radiation Rx at [facility] from 1/9- 10/13/18 - plan per ONC was going to be chemoRx but patient had further complications and now has decided not to take any chemotherapy and has decided for DNR status - palliative care was consulted on 10/24/2018, and currently are managing pain  Acute Pulmonary embolism/new portal vein thrombosis on 12/07/2018: The case was discussed  with hematology oncology and patient refused Lovenox, the patient opted for Eliquis which is sub-optimal alternative.  Dr Aileen Fass discussed the case with oncology who recommended restart Lovenox, currently he is tolerating it at full dose = 85mg  /day - in setting of recurrent GIB, per ONC Dr Marin Olp 3/17 note, another option would be to just stop the anticoagulation altogether, pt is DNR, has incurable disease, it might improve his QOL. Something to think about.    Acute blood loss anemia on anemia of chronic disease/hematochezia/malignant duodenal ulcer: On a PPI and sucralfate, he has received several doses of Feraheme, currently on oral iron.  New lower GI bleed/abdominal pain: around 12/06/18 -CT scan 3/16 showed no acute findings, like bowel obstruction, it showed stable pancreatic mass to small densities in the liver, it did show possible thrombosis of the inferior vena cava which may extend to the iliacs and femoral. - abdominal pain resolved, his Eliquis was held and he had 2 bloody bowel movements - have explained to patient his lifespan will less than 4 months - we discussed the case with oncology regarding bleeding and anticoag and they recommended to start Lovenox; more recently they also suggested that withdrawal of anticoagulation as another reasonable choice given the underlying clinical situation  Chronic systolic and diastolic heart failure: Stable no signs of overload.  Candida esophagitis: He has completed treatment.  Sepsis from healthcare associated pneumonia/Pseudomonas UTI: He has completed treatment with IV cefepime.  DM (diabetes mellitus), type 2, uncontrolled (HCC) Cont. long-acting insulin plus sliding scale insulin.   Disposition: - reportedly no SNF will take the patient, perhaps because he is homeless or doesn't have insurance or both. Patient states he was not homeless prior to admission, but he is homeless now -PT OT evaluated the patient and  recommended skilled nursing facility for his BKA as well as his malnutrition. -Needs rehabilitation at least 5 days a week to facilitate daily activities and transition to home. -Rehab is being requested as the most appropriate discharge option for the patient is not felt to be custodial care as evidenced by the patient's inability to perform ADLs and ambulate.  DVT prophylaxis: Lovenox Family Communication: none Disposition Plan/Barrier to D/C:SNF when bed available Code Status: DO NOT RESUSCITATE  Rob Doctor, hospital / [facility] [phone] 12/14/2018, 11:12 AM    Subjective: Patient resting in bed no complaints, in good spirits  Objective: Vitals:   12/13/18 1539 12/13/18 1541 12/13/18 2117 12/14/18 0555  BP: 99/76 96/73 104/87 94/77  Pulse: 97 95 91 100  Resp: 16  16 14   Temp: 98.5 F (36.9 C)  (!) 97.5 F (36.4 C) 97.7 F (36.5 C)  TempSrc: Oral  Oral Oral  SpO2: 99%  100% 100%  Weight:      Height:        Intake/Output Summary (Last 24 hours) at 12/14/2018 1112 Last data filed at 12/13/2018 1500 Gross per 24 hour  Intake 240 ml  Output -  Net 240 ml   Filed Weights   11/02/18 1806 11/04/18 0831 11/12/18 0742  Weight: 56.2 kg 56.2 kg 56.2 kg    Examination:  General exam: Appears calm and comfortable  Respiratory system: Clear to auscultation. Respiratory effort normal. Cardiovascular system: S1 & S2 heard, RRR. No JVD, murmurs, rubs, gallops or clicks. No pedal edema. Gastrointestinal system: Abdomen is nondistended, soft and nontender. No organomegaly or masses felt. Normal bowel sounds heard. Central nervous system: Alert and oriented. No focal neurological deficits. Extremities: Right BKA w/ open wound  packed 2x2 cm medially skin: No rashes, lesions or ulcers Psychiatry: Judgement and insight appear normal. Mood & affect appropriate.     Data Reviewed: I have personally reviewed following labs and imaging studies  CBC: Recent Labs  Lab 12/07/18 1843 12/08/18  0427 12/11/18 0336 12/14/18 0342  WBC 9.4 8.8 8.1 7.8  HGB 10.6* 10.2* 10.1* 9.7*  HCT 32.2* 32.0* 32.1* 30.1*  MCV 90.2 89.6 89.4 90.7  PLT 192 204 230 027   Basic Metabolic Panel: Recent Labs  Lab 12/10/18 0350 12/13/18 0319  NA  --  133*  K  --  3.6  CL  --  99  CO2  --  25  GLUCOSE  --  167*  BUN  --  14  CREATININE 0.61 0.57*  CALCIUM  --  8.2*   GFR: Estimated Creatinine Clearance: 81 mL/min (A) (by C-G formula based on SCr of 0.57 mg/dL (L)). Liver Function Tests: No results for input(s): AST, ALT, ALKPHOS, BILITOT, PROT, ALBUMIN in the last 168 hours. No results for input(s): LIPASE, AMYLASE in the last 168 hours. No results for input(s): AMMONIA in the last 168 hours. Coagulation Profile: No results for input(s): INR, PROTIME in the last 168 hours. Cardiac Enzymes: No results for input(s): CKTOTAL, CKMB, CKMBINDEX, TROPONINI in the last 168 hours. BNP (last 3 results) No results for input(s): PROBNP in the last 8760 hours. HbA1C: No results for input(s): HGBA1C in the last 72 hours. CBG: Recent Labs  Lab 12/13/18 1130 12/13/18 1630 12/13/18 2118 12/14/18 0810 12/14/18 1059  GLUCAP 180* 74 79 176* 87   Lipid Profile: No results for input(s): CHOL, HDL, LDLCALC, TRIG, CHOLHDL, LDLDIRECT in the last 72 hours. Thyroid Function Tests: No results for input(s): TSH, T4TOTAL, FREET4, T3FREE, THYROIDAB in the last 72 hours. Anemia Panel: No results for input(s): VITAMINB12, FOLATE, FERRITIN, TIBC, IRON, RETICCTPCT in the last 72 hours. Sepsis Labs: No results for input(s): PROCALCITON, LATICACIDVEN in the last 168 hours.  No results found for this or any previous visit (from the past 240 hour(s)).       Radiology Studies: No results found.      Scheduled Meds: . docusate sodium  100 mg Oral Daily  . enoxaparin (LOVENOX) injection  1.5 mg/kg Subcutaneous Q24H  . feeding supplement (ENSURE ENLIVE)  237 mL Oral TID BM  . insulin aspart  0-5 Units  Subcutaneous QHS  . insulin aspart  0-9 Units Subcutaneous TID WC  . insulin detemir  5 Units Subcutaneous Daily  . multivitamin with minerals  1 tablet Oral Daily  . pantoprazole  40 mg Oral BID  . polyethylene glycol  17 g Oral Daily  . sodium chloride flush  10-40 mL Intracatheter Q12H  . sucralfate  1 g Oral TID WC & HS   Continuous Infusions: . magnesium sulfate 1 - 4 g bolus IVPB       LOS: 102 days    If 7PM-7AM, please contact night-coverage [email or web address] Password [email or web address] 12/14/2018, 11:12 AM

## 2018-12-14 NOTE — Progress Notes (Signed)
Pt complained of indigestion, assessed Pt, gave PRN med. Rounded on Pt, he states med did not give any relief. MD notified, awaiting orders. Will continue to monitor.

## 2018-12-14 NOTE — Progress Notes (Signed)
Dressing changed on right stump.

## 2018-12-14 NOTE — Progress Notes (Signed)
Nutrition Follow-up  DOCUMENTATION CODES:   Severe malnutrition in context of chronic illness, Underweight  INTERVENTION:    Continue Ensure Enlive po TID, each supplement provides 350 kcal and 20 grams of protein  Continue Magic cup BID with meals, each supplement provides 290 kcal and 9 grams of protein  Continue MVI daily  NUTRITION DIAGNOSIS:   Severe Malnutrition related to chronic illness(Pancreatic cancer, DM and CHF) as evidenced by severe muscle depletion, severe fat depletion, percent weight loss.  Ongoing  GOAL:   Patient will meet greater than or equal to 90% of their needs  Meeting PO  MONITOR:   PO intake, Supplement acceptance, Skin  REASON FOR ASSESSMENT:   Consult Assessment of nutrition requirement/status  ASSESSMENT:   58 y.o male with PMH: recent dx pancreatic cancer, DM, CHF, crack cocaine use, emboli of legs, smoker. Pt admitted with DKA and GI bleed. 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 Received TPN from 2/14 to 2/23.   RD working remotely.  No recent goals of care discussion. Per oncology on 3/17, pt's prognosis is poor. Pt still awaiting SNF placement.   Unable to get in touch with nursing. Pt looks to be eating well. Meal completions charted as  75-100% for pt's last eight meals. Pt refused Ensure this am, intake of supplements look to be inconsistent. Will continue with current interventions.   A weight has not been obtained since 2/21.   I/O: +2705 since 3/10 UOP: 200 ml x 24 hrs  Medications reviewed and include: colace, SS novolog, levemir, MVI with minerals, miralax, Mg sulfate Labs reviewed: Na 133 (L) CBG 74- 239  Diet Order:   Diet Order            Diet heart healthy/carb modified Room service appropriate? Yes; Fluid consistency: Thin  Diet effective now              EDUCATION NEEDS:   Education needs have been addressed  Skin:  Skin Assessment: Skin Integrity Issues: Skin  Integrity Issues:: Other (Comment) Stage II: N/A Other: R leg BKA  Last BM:  3/22  Height:   Ht Readings from Last 1 Encounters:  11/12/18 5\' 11"  (1.803 m)    Weight:   Wt Readings from Last 1 Encounters:  11/12/18 56.2 kg    Ideal Body Weight:  73.1 kg  BMI:  Body mass index is 17.28 kg/m.  Estimated Nutritional Needs:   Kcal:  2000-2200 kcal  Protein:  100-110 grams  Fluid:  >/= 2L/day   Mariana Single RD, LDN Clinical Nutrition Pager # - [phone]

## 2018-12-14 NOTE — Progress Notes (Signed)
Physical Therapy Treatment Patient Details Name: Ryan Medina MRN: [id number] DOB: [birth year] Today's Date: 12/14/2018    History of Present Illness Pt is a 58 y.o. male with DM, HTN, sCHF EF 45% and polysubstance abuse/homelessness. He presented initially with left leg pain on 12/13, found to have acute thromboembolic ischemic left leg.  He was taken emergently for embolectomy.  Post-op course complicated.  Of primary importance, found to have inoperable pancreatic cancer.  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     PT Comments    Pt worked on R knee ROM and strengthening, he continues to keep knee in flexed position despite warnings for flexion contracture. Pt transferred to w/c and completed w/c mobility 600'. Practiced sit to stand to RW and hopping short distance. PT will continue to follow.    Follow Up Recommendations  SNF     Equipment Recommendations  Wheelchair cushion (measurements PT);Wheelchair (measurements PT)(with leg supports)    Recommendations for Other Services       Precautions / Restrictions LLE WBAT RLE NWB fall    Mobility  Bed Mobility Overal bed mobility: Independent Bed Mobility: Supine to Sit;Sit to Supine   Sidelying to sit: Modified independent (Device/Increase time) Supine to sit: Independent Sit to supine: Independent   General bed mobility comments: pt lying in bed upon arrival, complains of stomach pain but no RLE pain.  Transfers Overall transfer level: Modified independent Equipment used: Rolling walker (2 wheeled);None Transfers: Sit to/from [employer] to Stand: Modified independent (Device/Increase time) Stand pivot transfers: Modified independent (Device/Increase time)       General transfer comment: pt able to stand to RW mod I as well as pivot to and from w/c with no device safely, is quite impulsive and question his ability to problem solve situation if  something went wrong with transfer  Ambulation/Gait Ambulation/Gait assistance: Supervision Gait Distance (Feet): 2 Feet Assistive device: Rolling walker (2 wheeled) Gait Pattern/deviations: Step-to pattern     General Gait Details: hops to w/c   Secretary/administrator Wheelchair mobility: Yes Wheelchair propulsion: Both upper extremities Wheelchair parts: Independent Distance: 600 Wheelchair Assistance Details (indicate cue type and reason): pt mindful of setting brakes when getting in and out of chair  Modified Rankin (Stroke Patients Only)       Balance Overall balance assessment: Needs assistance Sitting-balance support: Feet supported;No upper extremity supported Sitting balance-Leahy Scale: Good     Standing balance support: Bilateral upper extremity supported Standing balance-Leahy Scale: Poor Standing balance comment: reliant on BUE support and RW to maintain standing balance                            Cognition Arousal/Alertness: Awake/alert Behavior During Therapy: WFL for tasks assessed/performed Overall Cognitive Status: History of cognitive impairments - at baseline Area of Impairment: Safety/judgement                         Safety/Judgement: Decreased awareness of deficits;Decreased awareness of safety     General Comments: fluctuating behavior at times, gets agitated easily      Exercises Amputee Exercises Quad Sets: AAROM;Right;10 reps Hip Extension: AROM;Right;10 reps;Standing Hip Flexion/Marching: AROM;Right;10 reps;Standing Knee Extension: AROM;Right(with overpressure from PT)    General Comments General comments (skin integrity, edema, etc.): discussed proper positioning for R knee extension  because pt tends to sit with RLE off EOB and R knee flexed      Pertinent Vitals/Pain Pain Assessment: Faces Faces Pain Scale: Hurts even more Pain Location: stomach Pain  Descriptors / Indicators: Aching Pain Intervention(s): Limited activity within patient's tolerance;Monitored during session    Home Living                      Prior Function            PT Goals (current goals can now be found in the care plan section) Acute Rehab PT Goals Patient Stated Goal: get a w/c PT Goal Formulation: With patient Time For Goal Achievement: 12/18/18 Potential to Achieve Goals: Good Progress towards PT goals: Progressing toward goals    Frequency    Min 1X/week      PT Plan Current plan remains appropriate    Co-evaluation              AM-PAC PT "6 Clicks" Mobility   Outcome Measure  Help needed turning from your back to your side while in a flat bed without using bedrails?: None Help needed moving from lying on your back to sitting on the side of a flat bed without using bedrails?: None Help needed moving to and from a bed to a chair (including a wheelchair)?: None Help needed standing up from a chair using your arms (e.g., wheelchair or bedside chair)?: None Help needed to walk in hospital room?: A Little Help needed climbing 3-5 steps with a railing? : A Lot 6 Click Score: 21    End of Session   Activity Tolerance: Patient tolerated treatment well Patient left: in bed;with call bell/phone within reach Nurse Communication: Mobility status PT Visit Diagnosis: Other abnormalities of gait and mobility (R26.89);Difficulty in walking, not elsewhere classified (R26.2);Pain;Muscle weakness (generalized) (M62.81) Pain - part of body: (abdomen)     Time: 1544-1600 PT Time Calculation (min) (ACUTE ONLY): 16 min  Charges:  $Therapeutic Exercise: 8-22 mins                     [place]  Pager [phone] Office [place] 12/14/2018, 4:46 PM

## 2018-12-14 NOTE — Progress Notes (Signed)
CSW spoke with patient regarding potential discharge to [facility].  Patient expressed concerns that he would not be able have any visitors, we discussed the no visitor policy current for all SNFs due to virus. Patient then stated he was going to call his brother to see if he has a room for him instead of going to [facility].   Patient will update CSW by tomorrow 3/25.   [place], [place]

## 2018-12-15 LAB — GLUCOSE, CAPILLARY
Glucose-Capillary: 231 mg/dL — ABNORMAL HIGH (ref 70–99)
Glucose-Capillary: 183 mg/dL — ABNORMAL HIGH (ref 70–99)

## 2018-12-15 MED ORDER — PANTOPRAZOLE SODIUM 40 MG PO TBEC: 40.0000 mg | DELAYED_RELEASE_TABLET | Freq: Two times a day (BID) | ORAL | Status: AC

## 2018-12-15 MED ORDER — POLYETHYLENE GLYCOL 3350 17 G PO PACK: 17.0000 g | PACK | Freq: Every day | ORAL | 0 refills | Status: AC

## 2018-12-15 MED ORDER — ADULT MULTIVITAMIN W/MINERALS CH: 1.0000 | ORAL_TABLET | Freq: Every day | ORAL | Status: AC

## 2018-12-15 MED ORDER — DOCUSATE SODIUM 100 MG PO CAPS: 100.0000 mg | ORAL_CAPSULE | Freq: Every day | ORAL | 0 refills | Status: AC

## 2018-12-15 MED ORDER — SUCRALFATE 1 GM/10ML PO SUSP: 1.0000 g | Freq: Three times a day (TID) | ORAL | 0 refills | Status: AC

## 2018-12-15 MED ORDER — INSULIN DETEMIR 100 UNIT/ML ~~LOC~~ SOLN: 5.0000 [IU] | Freq: Every day | SUBCUTANEOUS | 0 refills | Status: AC

## 2018-12-15 MED ORDER — INSULIN ASPART 100 UNIT/ML ~~LOC~~ SOLN: 0.0000 [IU] | Freq: Three times a day (TID) | SUBCUTANEOUS | 0 refills | Status: AC

## 2018-12-15 MED ORDER — CYCLOBENZAPRINE HCL 5 MG PO TABS: 5.0000 mg | ORAL_TABLET | Freq: Every day | ORAL | Status: AC

## 2018-12-15 MED ORDER — HEPARIN SOD (PORK) LOCK FLUSH 100 UNIT/ML IV SOLN
500.0000 [IU] | INTRAVENOUS | Status: AC | PRN
  Administered 2018-12-15: 500 [IU]

## 2018-12-15 MED ORDER — OXYCODONE-ACETAMINOPHEN 5-325 MG PO TABS: 1.0000 | ORAL_TABLET | Freq: Four times a day (QID) | ORAL | 0 refills | Status: AC | PRN

## 2018-12-15 MED ORDER — ENOXAPARIN SODIUM 100 MG/ML ~~LOC~~ SOLN: 1.5000 mg/kg | SUBCUTANEOUS | Status: AC

## 2018-12-15 NOTE — Progress Notes (Signed)
Patient has place at [facility] and could be discharged there today however patient is choosing to discharge back to shelter and refusing SNF placement at this time.   Patient will dc back to shelter today, please notify CSW when dc is ready and CSW will give taxi voucher for transportation.   [place], [place]

## 2018-12-15 NOTE — Progress Notes (Signed)
CSW consulted with patient again regarding dc plan, patient requested CSW speak with his sister. CSW spoke with sister on phone with patient and all agreed patient will benefit from [place] and this would be a safer discharge than returning to shelter as patient had previously desired.   Patient now in agreement to go to [place], SNF updated on this as well.   Will dc today to SNF.   [place], [place]

## 2018-12-15 NOTE — Progress Notes (Signed)
So far, Ryan Medina is doing about the same.  He is getting about with a rolling walker.  I think there is physical therapy that is working with him.  Our plan is for basically comfort care.  He is not a candidate for systemic chemotherapy for the pancreatic cancer.  There is still is no place for him to go.  I guess that is what is been waited upon.  His hemoglobin is dropping slowly.  He says there is no melena or hematochezia.  His hemoglobin is 9.7.  On 18 March it was 10.2.  He is still on blood thinner with the Lovenox.  He will continue on this.  I think if he does have a obvious GI bleed again, I would stop his anticoagulation.  He is eating.  He is having no nausea or vomiting.  He is having no abdominal pain.  He seems to be in pretty good spirits.  His vital signs all look pretty stable.  His blood pressure is 111/85.  His pulses 99.  Temperature 97.7.  Head neck exam shows no ocular or oral lesions.  He has no palpable adenopathy in the neck.  Lungs are clear bilaterally.  Cardiac exam regular rate and rhythm with no murmurs, rubs or bruits.  Abdomen is soft.  He has the healing laparotomy scar.  Bowel sounds are present.  Extremities shows the right BKA.  Left leg is unremarkable.  He has muscle atrophy in upper lower extremities.  At this point, our goal really is for his quality of life.  Comfort is very important.  He seems to be doing pretty well with this.  I would say try to get him to hospice home.  However, I am not sure he qualifies for hospice home given that his prognosis is over a month or so.  Again, I think if he has an overt bleeding episode, then I would definitely stop his anticoagulants.  [name], MD  [place] 12:12

## 2018-12-15 NOTE — Progress Notes (Signed)
Stump dressing changed per doctor's order

## 2018-12-15 NOTE — Plan of Care (Signed)
  Problem: Pain Managment: Goal: General experience of comfort will improve Outcome: Progressing   Problem: Clinical Measurements: Goal: Ability to maintain clinical measurements within normal limits will improve Outcome: Progressing   Problem: Skin Integrity: Goal: Demonstration of wound healing without infection will improve Outcome: Progressing

## 2018-12-15 NOTE — Discharge Summary (Signed)
Physician Discharge Summary   Patient ID: Enos Muhl Hileman MRN: [id number] DOB/AGE: 12-23-1960 58 y.o.  Admit date: 09/03/2018 Discharge date: 12/15/2018  Primary Care Physician:  Patient, No Pcp Per   Recommendations for Outpatient Follow-up:  1. Follow up with PCP in 1-2 weeks 2. Wound care: Pack open area of left below the knee amputation with a dry 4 x 4, cover with 4 x 4's, Kerlix, and a 4 inch Ace.  Change twice daily and as needed.    Home Health: Patient going to skilled nursing facility Equipment/Devices:   Discharge Condition: stable CODE STATUS: DNR Diet recommendation: Carb modified diet   Discharge Diagnoses:    . 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 Acute pulmonary embolism with new portal vein thrombosis on 3/17 .  Right leg claudication ([place]) . DM (diabetes mellitus), type 2, uncontrolled ([place]) . 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 AAA (abdominal aortic aneurysm) ([place]) . Essential hypertension . Acute blood loss anemia on anemia of chronic disease . PVD (peripheral vascular disease) ([place]) . Malignant duodenal ulcer . Cocaine abuse ([place]) . Status post gastrojejunostomy on 11/04/2018 . Pancreatic adenocarcinoma ([place]) . Severe protein calorie malnutrition . 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 Lisinopril Anaphylaxis     DISCHARGE MEDICATIONS: Allergies as of 12/15/2018      Reactions   Lisinopril Anaphylaxis      Medication List    STOP taking these medications   amLODipine 10 MG tablet Commonly known as:  NORVASC   atorvastatin 20 MG tablet Commonly known as:  LIPITOR   dicyclomine 20 MG  tablet Commonly known as:  BENTYL   gabapentin 300 MG capsule Commonly known as:  NEURONTIN   metFORMIN 1000 MG tablet Commonly known as:  GLUCOPHAGE   nitroGLYCERIN 0.4 MG SL tablet Commonly known as:  Nitrostat     TAKE these medications   cyclobenzaprine 5 MG tablet Commonly known as:  FLEXERIL Take 1 tablet (5 mg total) by mouth at bedtime. What changed:    medication strength  how much to take  when to take this  reasons to take this   docusate sodium 100 MG capsule Commonly known as:  COLACE Take 1 capsule (100 mg total) by mouth daily.   enoxaparin 100 MG/ML injection Commonly known as:  LOVENOX Inject 0.85 mLs (85 mg total) into the skin daily.   glucose blood test strip Commonly known as:  AgaMatrix Presto Test Check blood sugars prior to meals and at bedtime   insulin aspart 100 UNIT/ML injection Commonly known as:  novoLOG Inject 0-9 Units into the skin 3 (three) times daily with meals. Sliding scale CBG 70 - 120: 0 units CBG 121 - 150: 1 unit,  CBG 151 - 200: 2 units,  CBG 201 - 250: 3 units,  CBG 251 - 300: 5 units,  CBG 301 - 350: 7 units,  CBG 351 - 400: 9 units   CBG > 400: 9 units and notify your MD What changed:    how much to take  additional instructions   insulin detemir 100 UNIT/ML injection Commonly known as:  Levemir Inject 0.05 mLs (5 Units total) into the skin daily. What changed:    how much to take  when to take this   multivitamin with minerals Tabs tablet Take 1 tablet by mouth daily.   oxyCODONE-acetaminophen 5-325 MG tablet Commonly known as:  PERCOCET/ROXICET Take 1-2 tablets by mouth every 6 (six) hours as needed for moderate pain.   pantoprazole 40 MG tablet Commonly known as:  PROTONIX Take 1 tablet (40 mg total) by mouth 2 (two) times daily.   polyethylene glycol packet Commonly known as:  MIRALAX / GLYCOLAX Take 17 g by mouth daily.   sucralfate 1 GM/10ML suspension Commonly known as:  CARAFATE Take 10 mLs  (1 g total) by mouth 4 (four) times daily -  with meals and at bedtime. X 4 weeks            Durable Medical Equipment  (From admission, onward)         Start     Ordered   09/30/18 0754  For home use only DME 4 wheeled rolling walker with seat  Once    Question:  Patient needs a walker to treat with the following condition  Answer:  Debility   09/30/18 0753   09/30/18 0754  For home use only DME 3 n 1  Once     09/30/18 0753           Brief H and P: For complete details please refer to admission H and P, but in brief 58 y.o.malepast medical history of diabetes mellitus, hypertension, systolic heart failure with an EF of 45%, polysubstance abuse who presented on 09/03/2018 with right leg pain was found to have an acute thromboembolic ischemic event taken emergent leading to embolectomy  Major Hospital events: 09/03/2018-Right popliteal and tibial embolectomy due to ischemic right lower extremity. 09/05/2018- Evacuation of hematoma and fasciotomy of the right leg due to bleeding from right leg wound. 09/07/2018 EGD showing active bleeding ulcer. 09/07/2018 Decompensated and intubated due to duodenal ulcer with hemorrhage. IR performed coiling. 09/10/2018 Recurrent right lower extremity embolism requiring repeated leg embolectomy. 09/11/2018 CTA showed pancreatic mass with mural thrombus and dilated bilateral iliac arteries, diagnosed with locally advanced pancreatic cancer incidentally.  09/16/2018 - EUS biopsy of pancreatic mass -Transferred to [facility] long hospital for radiation from 09/30/2020 - 10/13/2018  Port-A-Cath placed on 10/12/2018 for chemotherapy - PE on 08/27/2019 anticoagulation restarted. 10/29/2018 - repeat EGD for follow-up ulcer showed malignant pyloric stenosis and candidiasis 11/04/2018 - open gastrojejunostomy was performed due to malignant pyloric stenosis. 11/12/2018 EGD repeated for hematochezia, no source of bleeding. 11/19/2018 - Right leg gangrene, status  post BKA  12/06/18 - CT scan of the abdomen and pelvis showed stable pancreatic mass, 2 small densities in the liver, and possible thrombosis of the inferior vena cava which may extend into the iliac and femoral veins  Hospital Course:    Right leg gangrene due to Popliteal artery embolus/status post embolectomy x2 which eventually led to the BKA on 11/19/2018 by VVS: -PT following, recommended skilled nursing facility -Continue pain management, bowel regimen -Dressing instructions as above, follow-up outpatient with vascular surgery   Gastric outlet obstruction due to malignant pyloric stenosis/severe protein malnutrition/status post gastrojejunostomy 11/04/18: -Patient was closely followed by her GI, surgery.  Currently tolerating oral diet without any difficulty.  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  - Patient had a radiation treatment at [facility] hospital from 09/30/2018 to 10/13/2018 -[place] [facility] was consulted, seen by Dr. Marin Olp, patient is not  a candidate for systemic chemotherapy for pancreatic cancer, recommended comfort care and symptom control. -Palliative care was consulted on 10/24/2018   Acute Pulmonary embolism/new portal vein thrombosis on 12/07/2018: Patient closely followed by hematology oncology, per Dr. Marin Olp, continue Lovenox.  If he does have obvious GI bleed again, recommended to stop anticoagulation.     Acute blood loss anemia on anemia of chronic disease/hematochezia/malignant duodenal ulcer: On a PPI and sucralfate, he has received several doses of Feraheme  New lower GI bleed/abdominal pain: around 12/06/18 -CT scan 3/16 showed no acute findings, like bowel obstruction, it showed stable pancreatic mass to small densities in the liver, it did show possible thrombosis of the inferior vena cava which may extend to the iliacs and femoral. -  abdominal pain resolved, his Eliquis was held and he had 2 bloody bowel movements -Per oncology recommendations, Eliquis has been discontinued and patient started on Lovenox.  Per Dr. Marin Olp, if he has GI bleed again, recommended to stop anticoagulation and focus on comfort care.   Chronic systolic and diastolic heart failure: Stable, compensated.  2D echo on 09/04/2018 showed EF of 45 to 50% with grade 1 diastolic dysfunction.  Candida esophagitis: He has completed treatment.  Sepsis from healthcare associated pneumonia/Pseudomonas UTI: Patient has completed treatment with IV cefepime.  DM (diabetes mellitus), type 2, uncontrolled (HCC) Cont. long-acting insulin plus sliding scale insulin.    Day of Discharge S: No complaints, no fevers or chills, nausea or vomiting.  Tolerating diet without any difficulty.  Pain controlled.  BP (!) 123/96 (BP Location: Left Arm)   Pulse 99   Temp 97.7 F (36.5 C) (Oral)   Resp 16   Ht 5\' 11"  (1.803 m)   Wt 56.2 kg   SpO2 100%   BMI 17.28 kg/m   Physical Exam: General: Alert and awake oriented x3 not in any acute distress. HEENT: anicteric sclera, pupils reactive to light and accommodation CVS: S1-S2 clear no murmur rubs or gallops Chest: clear to auscultation bilaterally, no wheezing rales or rhonchi Abdomen: soft nontender, nondistended, normal bowel sounds Extremities: Right BKA with dressing Neuro: Cranial nerves II-XII intact, no focal neurological deficits   The results of significant diagnostics from this hospitalization (including imaging, microbiology, ancillary and laboratory) are listed below for reference.      Procedures/Studies:  Ct Abdomen Pelvis W Contrast  Result Date: 12/07/2018 CLINICAL DATA:  Recently diagnosed locally advanced stage IV adenocarcinoma of the pancreas nonoperative, he has not started chemotherapy, with a right leg gangrene due to popliteal artery embolus, gastric outlet obstruction due to  malignant pyloric stenosis. Epigastric pain/bloody stools. EXAM: CT ABDOMEN AND PELVIS WITH CONTRAST TECHNIQUE: Multidetector CT imaging of the abdomen and pelvis was performed using the standard protocol following bolus administration of intravenous contrast. CONTRAST:  161mL OMNIPAQUE IOHEXOL 300 MG/ML  SOLN COMPARISON:  11/25/2018 and older exams. FINDINGS: Lower chest: Opacity consistent with atelectasis noted at the left lateral lung base, stable from the prior exam. Lung bases otherwise clear. Hepatobiliary: Subtle liver lesions. 7 mm hypoattenuating lesion, right lobe segment 6. Similar-sized similar appearing lesion segment 4 B. tiny low-density lesion lateral right lobe, likely a cyst. 1 cm probable cyst, segment 2. No other discrete liver abnormalities. Normal gallbladder. No intrahepatic bile duct dilation. More distal common bile duct is not well visualized. There is metallic density projecting along the anterior margin of the pancreas which may reflect a vascular embolization coil. It is stable. Pancreas: Not well-defined. Heterogeneous appearance of the tail consistent  with the known mass, is without significant change. Spleen: Normal in size without focal abnormality. Adrenals/Urinary Tract: No adrenal masses. Kidneys normal in size, orientation and position with symmetric enhancement and excretion. Tiny low-density lesion in the lower pole the right kidney consistent with a cyst. No stones. No hydronephrosis. Ureters are not well visualized due to increased attenuation of the retroperitoneal fat as well as the paucity of fat. No evidence of ureteral dilation or of a ureteral stone. Bladder is unremarkable. Stomach/Bowel: Stomach mildly distended. Folds appear prominent. The duodenum is not well-defined. Small bowel is normal in caliber.  No convincing wall thickening. Mild increased stool burden in the colon. No colonic dilation. Colonic wall is not well-defined, but there is no convincing wall  thickening or inflammation. Vascular/Lymphatic: 2.9 cm abdominal aortic aneurysm. Aortic atherosclerosis. 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 Significant internal iliac atherosclerosis. Limited enhancement of the inferior vena cava below the level of the left renal artery. Could not exclude inferior vena cava thrombosis. Reproductive: Unremarkable. Other: Generalized increased attenuation of fat throughout the peritoneal cavity. Questionable small serosal nodules over the dome of the liver. Musculoskeletal: No fracture or acute finding. No osteoblastic or osteolytic lesions. IMPRESSION: 1. Study is limited due to the relative increased attenuation of the peritoneal fat limiting contrast between the peritoneal fat and adjacent structures. 2. No convincing acute finding.  No bowel obstruction. 3. Stable appearance of the pancreatic mass, poorly defined on the current exam. 4. Two small low-density liver lesions are noted suspicious for metastatic disease. 5. Possible thrombosis of the inferior vena cava, which may extend into the iliac and femoral veins. 6. Stable aortic and iliac artery aneurysms. Electronically Signed   By: Lajean Manes M.D.   On: 12/07/2018 00:50   Ct Abdomen Pelvis W Contrast  Result Date: 11/25/2018 CLINICAL DATA:  Inpatient. Nonoperable malignant-appearing pancreatic mass diagnosed December 2019. Restaging. EXAM: CT ABDOMEN AND PELVIS WITH CONTRAST TECHNIQUE: Multidetector CT imaging of the abdomen and pelvis was performed using the standard protocol following bolus administration of intravenous contrast. CONTRAST:  127mL OMNIPAQUE IOHEXOL 300 MG/ML  SOLN COMPARISON:  11/10/2018 CT angiogram of the abdomen and pelvis. FINDINGS: Lower chest: No significant pulmonary nodules or acute consolidative airspace disease. Stable mildly thickened parenchymal band at the left lung base compatible with postinfectious/postinflammatory  scarring. Tip of superior approach Port-A-Cath is seen at the cavoatrial junction. Hepatobiliary: Normal liver size. Simple 1.0 cm lateral segment left liver lobe cyst is stable. No new liver lesions. Normal gallbladder with no radiopaque cholelithiasis. No biliary ductal dilatation. Pancreas: Poorly marginated heterogeneous hypoenhancing 5.5 x 4.0 cm pancreatic body mass (series 3/image 24), previously 5.3 x 3.9 cm on 11/10/2018 using similar measurement technique, stable to minimally increased. Stable atrophy and duct dilation in the pancreatic tail. Stable encasement of the celiac trunk by the mass. At least partial anterior encasement of the proximal SMA by the mass. Spleen: Normal size. No mass. Adrenals/Urinary Tract: Normal adrenals. Nonobstructing 3 mm lower right renal stone. Stable scattered subcentimeter hypodense renal cortical lesions in both kidneys. No new renal lesions. No hydronephrosis. Normal bladder. Stomach/Bowel: Stomach is nondistended. Stable gastric fold thickening in the fundus and proximal body of the stomach. Normal caliber small bowel with no small bowel wall thickening. Appendectomy. Oral contrast transits to the left colon. Mild-to-moderate colonic stool with no large bowel wall thickening, colonic diverticulosis or significant pericolonic fat stranding. Vascular/Lymphatic: Atherosclerotic abdominal aorta with  stable 3.3 cm infrarenal abdominal aortic aneurysm. 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 Stable 2.9 cm left common iliac artery aneurysm. Patent hepatic, portal and renal veins. Stable occlusion of splenic vein at the level of the pancreatic tail. Redemonstration of embolization coils in the gastroduodenal artery. Stable nonocclusive thrombosis of the right common femoral vein. Mildly enlarged 1.3 cm right external iliac node (series 3/image 71), increased from 1.0 cm. No additional pathologically enlarged lymph nodes in the abdomen or pelvis. Reproductive: Normal  size prostate. Other: No pneumoperitoneum, ascites or focal fluid collection. Progressive cachexia, particularly compared to 12/19/2017 CT study. Musculoskeletal: No aggressive appearing focal osseous lesions. Moderate lower lumbar spondylosis. IMPRESSION: 1. Locally advanced 5.5 cm pancreatic body mass is stable to slightly increased since 11/10/2018 CT. 2. Mild right external iliac lymphadenopathy is mildly increased, nonspecific, could be reactive or metastatic. 3. No additional potential sites of metastatic disease. 4. Stable 3.3 cm Abdominal Aortic Aneurysm (ICD10-I71.9). Stable bilateral common iliac artery aneurysms. 5. Stable nonocclusive DVT in the right common femoral vein. 6. Progressive cachexia. Electronically Signed   By: Ilona Sorrel M.D.   On: 11/25/2018 16:52       LAB RESULTS: Basic Metabolic Panel: Recent Labs  Lab 12/10/18 0350 12/13/18 0319  NA  --  133*  K  --  3.6  CL  --  99  CO2  --  25  GLUCOSE  --  167*  BUN  --  14  CREATININE 0.61 0.57*  CALCIUM  --  8.2*   Liver Function Tests: No results for input(s): AST, ALT, ALKPHOS, BILITOT, PROT, ALBUMIN in the last 168 hours. No results for input(s): LIPASE, AMYLASE in the last 168 hours. No results for input(s): AMMONIA in the last 168 hours. CBC: Recent Labs  Lab 12/11/18 0336 12/14/18 0342  WBC 8.1 7.8  HGB 10.1* 9.7*  HCT 32.1* 30.1*  MCV 89.4 90.7  PLT 230 237   Cardiac Enzymes: No results for input(s): CKTOTAL, CKMB, CKMBINDEX, TROPONINI in the last 168 hours. BNP: Invalid input(s): POCBNP CBG: Recent Labs  Lab 12/14/18 2135 12/15/18 0810  GLUCAP 113* 231*      Disposition and Follow-up: Discharge Instructions    Diet Carb Modified   Complete by:  As directed    Increase activity slowly   Complete by:  As directed        DISPOSITION: Skilled nursing facility   [facility]. Schedule an appointment as soon as  possible for a visit in 1 week(s).   Contact information: [address] [place] [phone]       Volanda Napoleon, MD. Schedule an appointment as soon as possible for a visit.   Specialty:  Oncology Contact information: [place] [address] [phone]        Johnathan Hausen, MD. Call.   Specialty:  General Surgery Why:  call to arrange follow up regarding recent abdominal surgery Contact information: [address] [place] [address] [phone]        Angelia Mould, MD Follow up in 3 week(s).   Specialties:  Vascular Surgery, Cardiology Why:  office will call Contact information: [place] [address] [phone]            Time coordinating discharge:  45-minutes  Signed:   Estill Cotta M.D. [facility] 12/15/2018, 10:48 AM

## 2018-12-15 NOTE — TOC Transition Note (Signed)
Transition of Care [facility]) - CM/SW Discharge Note   Patient Details  Name: Ryan Medina MRN: [id number] Date of Birth: 22-Aug-1961  [facility] CM/SW Contact:  Alberteen Sam, LCSW Phone Number: 12/15/2018, 11:33 AM   Clinical Narrative:     Patient will DC to: [facility] Anticipated DC date: 12/15/2018 Family notified:[name] (sister) Transport XE:[facility]  Per MD patient ready for DC to [facility] . RN, patient, patient's family, and facility notified of DC. Discharge Summary sent to facility. RN given number for report [phone]. DC packet on chart. Ambulance transport requested for patient.  CSW signing off.  [place], [place]   Final next level of care: Skilled Nursing Facility Barriers to Discharge: No Barriers Identified   Patient Goals and CMS Choice Patient states their goals for this hospitalization and ongoing recovery are:: to go to [facility] and get better CMS Medicare.gov Compare Post Acute Care list provided to:: Patient Choice offered to / list presented to : Patient  Discharge Placement PASRR number recieved: 10/01/18            Patient chooses bed at: Other - please specify in the comment section below:([facility]) Patient to be transferred to facility by: [place] Name of family member notified: [name] (sister) Patient and family notified of of transfer: 12/15/18  Discharge Plan and Services In-house Referral: Clinical Social Work Discharge Planning Services: CM Consult            DME Arranged: Programmer, multimedia DME Agency: [place]: RN, PT, Social Work [employer] Agency: [place] ([place]) Interventions     Readmission Risk Interventions Readmission Risk Prevention Plan 12/02/2018  Transportation Screening Complete  PCP or Specialist appointment within 3-5 days of discharge Complete  HRI or [place] Not Complete  HRI or Home  Care Consult Pt Refusal Comments (No Data)  SW Recovery Care/Counseling Consult Complete  Palliative Care Screening Complete  Skilled [place] Not Complete  SNF Comments in progress, patient is difficult to place  Some recent data might be hidden

## 2018-12-15 NOTE — Progress Notes (Signed)
Attempted to give report but nurse to take report unavailable, waiting for facility's callback.

## 2018-12-15 NOTE — Progress Notes (Signed)
Called facility, gave report to [place], [place], all questions and concerns addressed, Pt not in distress, discharged to facility with belongings via PTAR.

## 2018-12-16 ENCOUNTER — Telehealth: Payer: Self-pay | Admitting: Hematology & Oncology

## 2018-12-16 NOTE — Telephone Encounter (Signed)
Spoke with Lesleigh Noe at [facility] in [place] to inform of appt 12/24/18 at 12 pm per 3/25 sch msg

## 2018-12-17 ENCOUNTER — Telehealth: Payer: Self-pay | Admitting: Vascular Surgery

## 2018-12-17 NOTE — Telephone Encounter (Signed)
-----   Message from Mena Goes, RN sent at 12/15/2018  4:12 PM EDT ----- Regarding: staple removal next week, call SNF for appt  ----- Message ----- From: Angelia Mould, MD Sent: 12/15/2018   3:55 PM EDT To: Vvs Charge Pool Subject: f/u                                            It looks like this patient was discharged from the hospital today.  He underwent an amputation and still has staples in.  He needs to be seen hopefully next week to have his staples removed.  He is homeless and was discharged to a facility. CD

## 2018-12-17 NOTE — Telephone Encounter (Signed)
sch appt spk to pt SNF 4/1/202011 am staple removal MD

## 2018-12-22 ENCOUNTER — Ambulatory Visit (INDEPENDENT_AMBULATORY_CARE_PROVIDER_SITE_OTHER): Payer: Self-pay | Admitting: Vascular Surgery

## 2018-12-22 ENCOUNTER — Encounter: Payer: Self-pay | Admitting: Vascular Surgery

## 2018-12-22 ENCOUNTER — Other Ambulatory Visit: Payer: Self-pay

## 2018-12-22 VITALS — BP 108/75 | HR 100 | Temp 97.1°F | Resp 20 | Ht 71.0 in | Wt 123.0 lb

## 2018-12-22 DIAGNOSIS — Z48812 Encounter for surgical aftercare following surgery on the circulatory system: Secondary | ICD-10-CM

## 2018-12-22 NOTE — Progress Notes (Signed)
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  HPI:   Ryan Medina is a pleasant 58 y.o. male who had presented with a gangrenous right foot.  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  He was in the hospital for a while when they were awaiting placement.  I believe he is homeless.  During that time I did remove some staples to evacuate hematoma and the wound was then packed.  He comes in for his first outpatient visit.  He has been in a skilled nursing facility and they have been doing his dressing changes.  He denies fever or chills.  Current Outpatient Medications  Medication Sig Dispense Refill  . cyclobenzaprine (FLEXERIL) 5 MG tablet Take 1 tablet (5 mg total) by mouth at bedtime.    . docusate sodium (COLACE) 100 MG capsule Take 1 capsule (100 mg total) by mouth daily. 10 capsule 0  . enoxaparin (LOVENOX) 100 MG/ML injection Inject 0.85 mLs (85 mg total) into the skin daily. 0 Syringe   . glucose blood (AGAMATRIX PRESTO TEST) test strip Check blood sugars prior to meals and at bedtime 100 each 0  . insulin aspart (NOVOLOG) 100 UNIT/ML injection Inject 0-9 Units into the skin 3 (three) times daily with meals. Sliding scale CBG 70 - 120: 0 units CBG 121 - 150: 1 unit,  CBG 151 - 200: 2 units,  CBG 201 - 250: 3 units,  CBG 251 - 300: 5 units,  CBG 301 - 350: 7 units,  CBG 351 - 400: 9 units   CBG > 400: 9 units and notify your MD 20 mL 0  . insulin detemir (LEVEMIR) 100 UNIT/ML injection Inject 0.05 mLs (5 Units total) into the skin daily. 20 mL 0  . Multiple Vitamin (MULTIVITAMIN WITH MINERALS) TABS tablet Take 1 tablet by mouth daily.    Marland Kitchen oxyCODONE-acetaminophen (PERCOCET/ROXICET) 5-325 MG tablet Take 1-2 tablets by mouth every 6 (six) hours as needed for moderate pain. 15 tablet 0  . pantoprazole (PROTONIX) 40 MG tablet Take 1 tablet (40 mg total) by mouth 2 (two) times  daily.    . polyethylene glycol (MIRALAX / GLYCOLAX) packet Take 17 g by mouth daily. 14 each 0  . sucralfate (CARAFATE) 1 GM/10ML suspension Take 10 mLs (1 g total) by mouth 4 (four) times daily -  with meals and at bedtime. X 4 weeks 420 mL 0   No current facility-administered medications for this visit.     REVIEW OF SYSTEMS:  [X]  denotes positive finding, [ ]  denotes negative finding Vascular    Leg swelling    Cardiac    Chest pain or chest pressure:    Shortness of breath upon exertion:    Short of breath when lying flat:    Irregular heart rhythm:    Constitutional    Fever or chills:     PHYSICAL EXAM:   Vitals:   12/22/18 1144  BP: 108/75  Pulse: 100  Resp: 20  Temp: (!) 97.1 F (36.2 C)  SpO2: 99%  Weight: 123 lb (55.8 kg)  Height: 5\' 11"  (1.803 m)    GENERAL: The patient is a well-nourished male, in no acute distress. The vital signs are documented above. CARDIOVASCULAR: There is a regular rate and rhythm. PULMONARY: There is good air exchange bilaterally without wheezing or rales. I removed the staples today.  The area  that was being packed has gotten smaller I repacked this.  There is 1 area centrally which has some drainage but I tried to probe this with a Q-tip and it did not appear to go very deep.  DATA:   No new data  MEDICAL ISSUES:   STATUS POST RIGHT BELOW THE KNEE AMPUTATION: The amputation site is still not out of the woods.  We will continue to pack the open area and the area centrally will need gauze over this as it is draining and may ultimately require packing.  I plan on seeing him back in 3 to 4 weeks.  I have written strict dressing change instructions for the skilled nursing facility.  [name] [facility] [phone]

## 2018-12-23 ENCOUNTER — Telehealth: Payer: Self-pay | Admitting: Hematology & Oncology

## 2018-12-23 NOTE — Telephone Encounter (Signed)
Received call from Metzger at rehab facility to cxl pt appt on 4/7 due to being on lockdown. The house MD Les Pou states that if there is anything he can do inhouse then call the facility at [phone].

## 2018-12-27 ENCOUNTER — Ambulatory Visit: Payer: Self-pay | Admitting: Hematology & Oncology

## 2018-12-27 ENCOUNTER — Other Ambulatory Visit: Payer: Self-pay

## 2018-12-28 ENCOUNTER — Encounter: Payer: Self-pay | Admitting: *Deleted

## 2018-12-28 ENCOUNTER — Ambulatory Visit: Payer: Self-pay | Admitting: Hematology & Oncology

## 2018-12-28 ENCOUNTER — Other Ambulatory Visit: Payer: Self-pay

## 2018-12-28 ENCOUNTER — Telehealth: Payer: Self-pay | Admitting: *Deleted

## 2018-12-28 DIAGNOSIS — C259 Malignant neoplasm of pancreas, unspecified: Secondary | ICD-10-CM

## 2019-01-17 ENCOUNTER — Ambulatory Visit: Payer: Medicaid Other | Admitting: Family

## 2019-01-21 DEATH — deceased

## 2019-06-20 IMAGING — CT CT ABDOMEN WO/W CM
3 of 9 series · 12 of 46 positions shown, 18 images · IV contrast (iopamidol)
Comparison: MRI 09/12/2018

CLINICAL DATA: Head of pancreas mass. Increased bleeding. Evaluate
for duodenal obstruction

EXAM:
CT ABDOMEN WITHOUT AND WITH CONTRAST
TECHNIQUE: Multidetector CT imaging of the abdomen was performed following the
standard protocol before and following the bolus administration of
intravenous contrast.
CONTRAST:  100mL 17MWJR-CTI IOPAMIDOL (17MWJR-CTI) INJECTION 76%

[Series 4: arterial 3.0 · axial · arterial · 0.72mm/px · z∈[+1028,+1067]mm · 2 of 106 slices shown]
[im 14/106  soft-tissue]
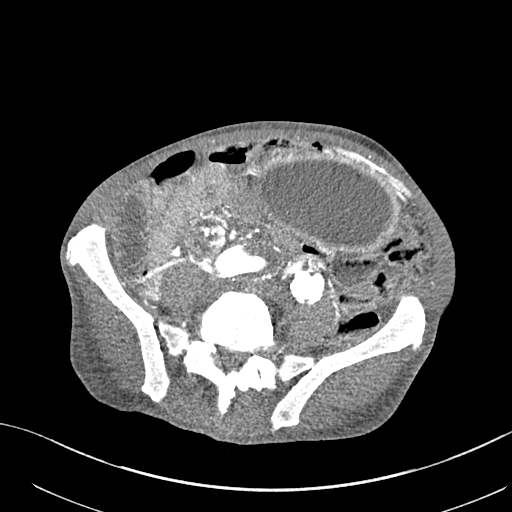
[im 27/106  soft-tissue]
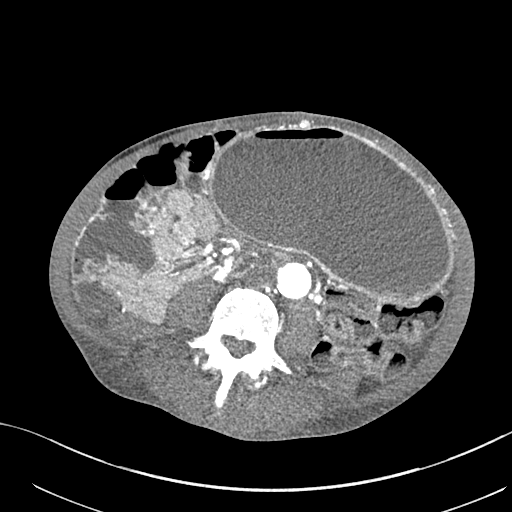

[Series 5: venous 3.0 · axial · portal-venous · 0.72mm/px · z∈[+1028,+1262]mm · 7 of 106 slices shown, 12 images]
[im 14/106  soft-tissue]
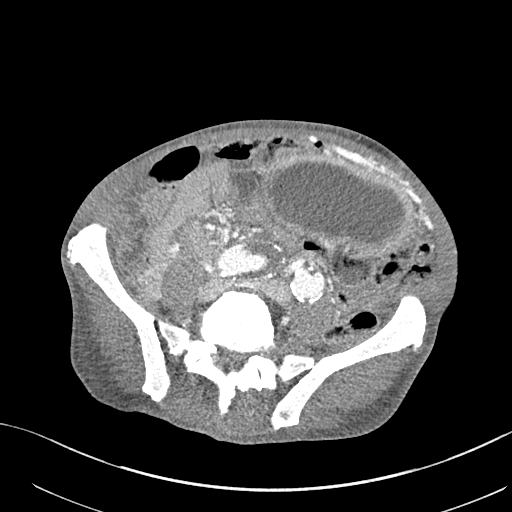
[im 14/106  bone]
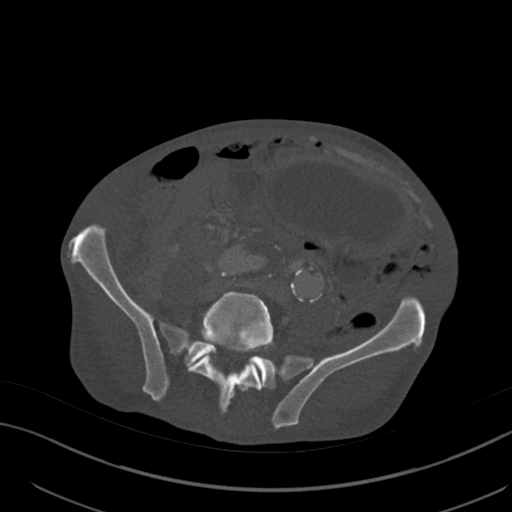
[im 27/106  soft-tissue]
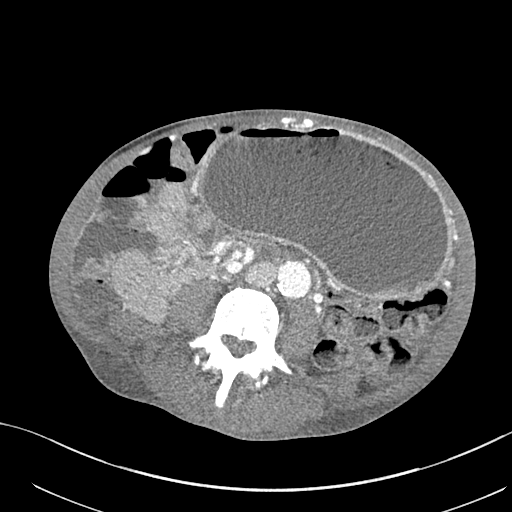
[im 40/106  soft-tissue]
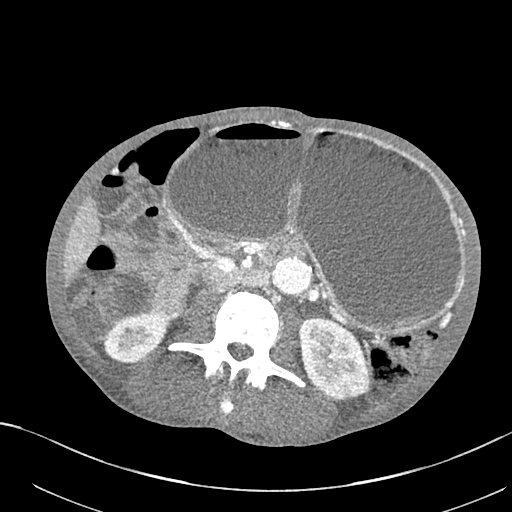
[im 53/106  soft-tissue]
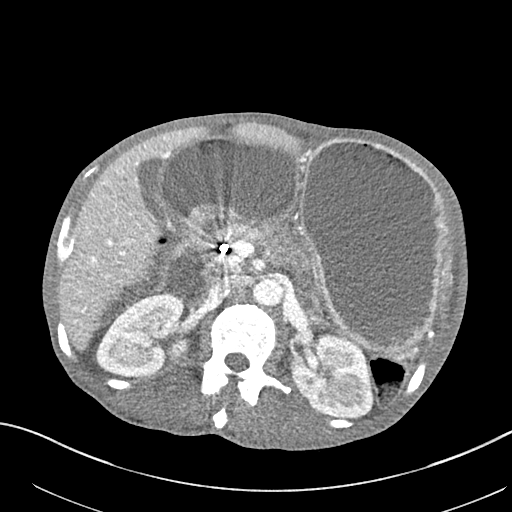
[im 53/106  lung]
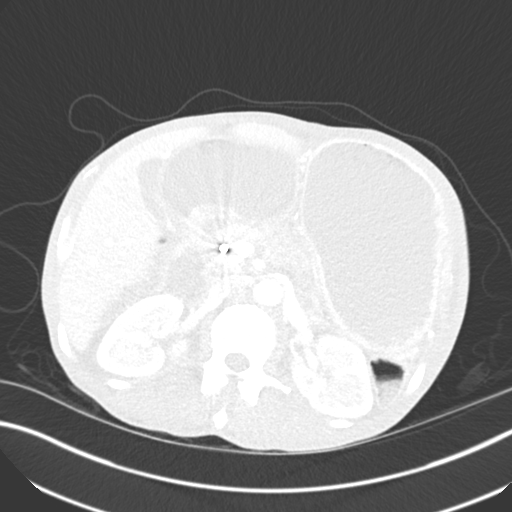
[im 66/106  soft-tissue]
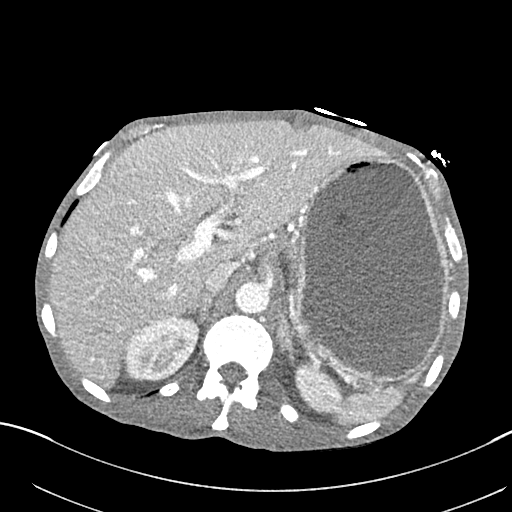
[im 66/106  lung]
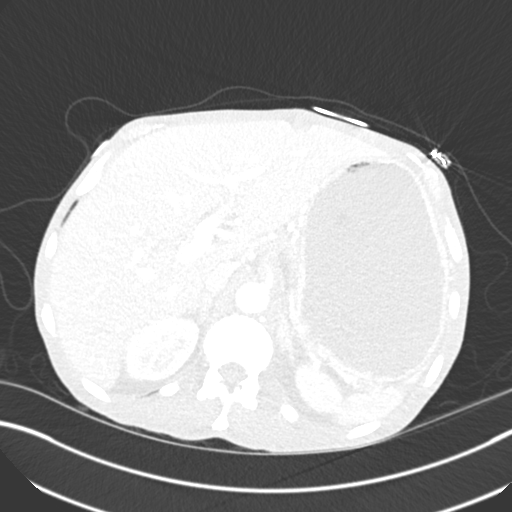
[im 79/106  soft-tissue]
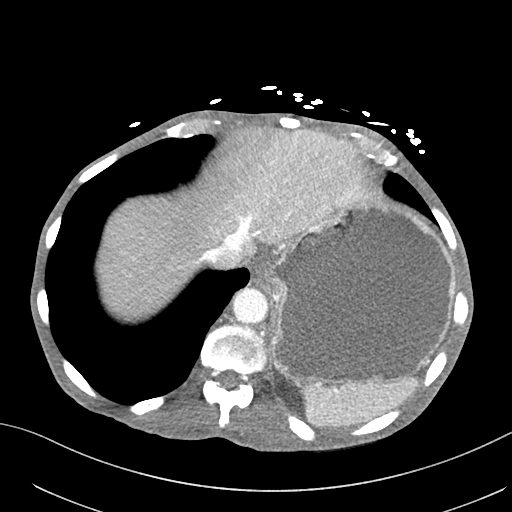
[im 79/106  lung]
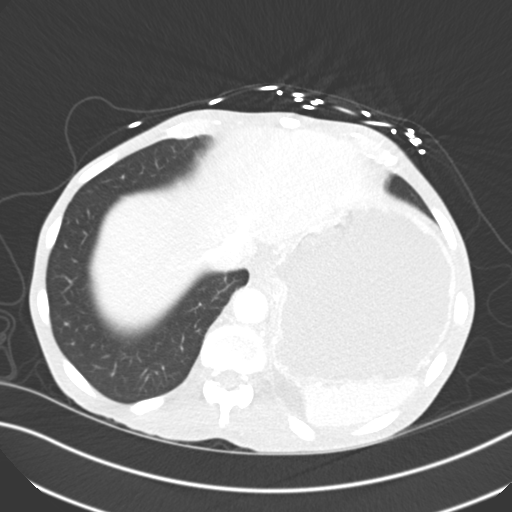
[im 92/106  soft-tissue]
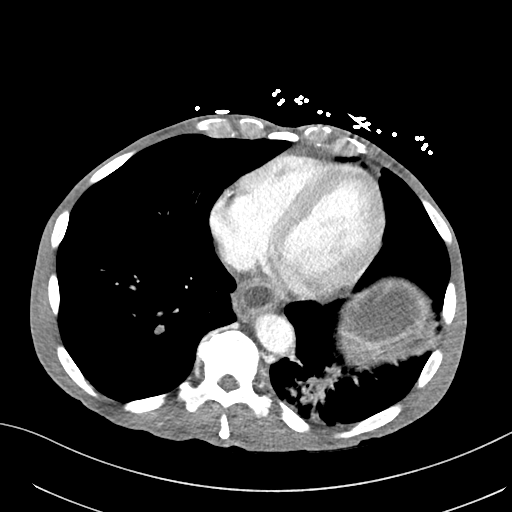
[im 92/106  lung]
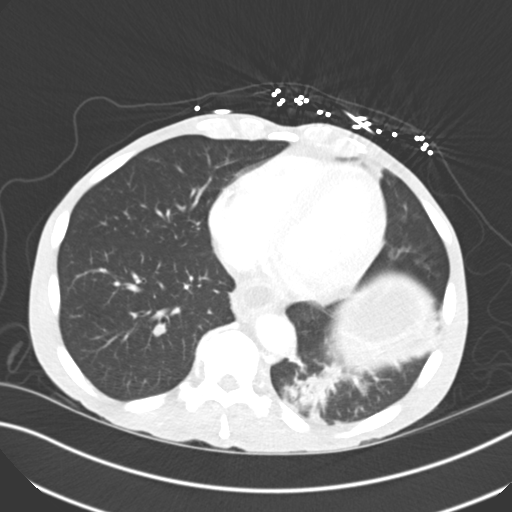

[Series 8: arterial 2.0 cor · coronal · arterial · 0.65mm/px · 3 of 129 slices shown, 4 images]
[im 33/129  soft-tissue]
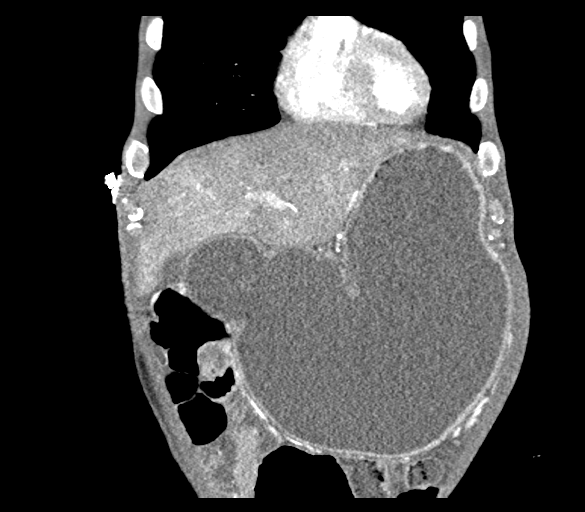
[im 65/129  soft-tissue]
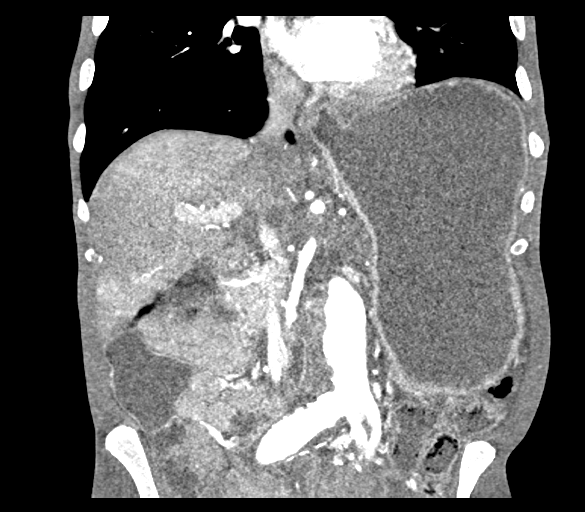
[im 65/129  bone]
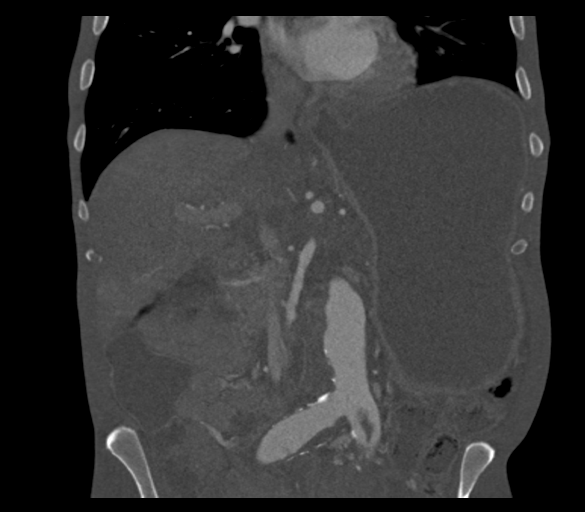
[im 97/129  soft-tissue]
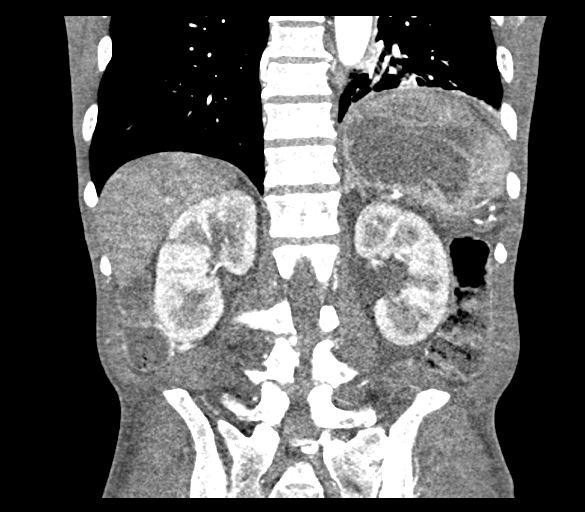

[12 of 46 positions shown; findings below may reference images not displayed]

FINDINGS: Lower chest: Subsegmental atelectasis and or consolidation noted in
the left lung base.

Hepatobiliary: 1 cm low-attenuation structure in the left lobe of
liver is unchanged, image [DATE]. No new focal liver abnormalities.
Gallbladder appears normal. No intrahepatic bile duct dilatation. No
common bile duct dilatation identified.

Pancreas: Neck of pancreas mass is again noted. On today's exam this
measures 4.5 x 3.7 by 4.7 cm, image 48/5. Previously 3.6 x 3.6 by
3.2 cm. There is atrophy of the body and tail of pancreas with
dilatation of the main duct.

Spleen: Normal in size without focal abnormality.

Adrenals/Urinary Tract: Adrenal glands are unremarkable. Kidneys are
normal, without renal calculi, focal lesion, or hydronephrosis.

Stomach/Bowel: There is marked distension of the gastric lumen which
is increased from 09/12/2018. The stomach is distended to the level
of the distal antrum. No discrete mass identified. There is no
abnormal gastric wall thickening or inflammation. No duodenal wall
thickening or inflammation identified. There is mild increase
caliber of the descending duodenum with a normal caliber transverse
duodenum. Visualized small bowel loops are normal in caliber. No
pathologic dilatation of the visualized colon.

Vascular/Lymphatic: Aortic atherosclerosis. Infrarenal abdominal
aortic ectasia measures 3.2 cm in maximum distension. Embolization
coils of the gastroduodenal artery noted. The portal vein, portal
venous confluence and splenic vein remain patent. Soft tissue
encases and partially narrows the celiac and superior mesenteric
artery without complete occlusion. The celiac artery remains patent.

Other: There is been interval decrease in subcutaneous and
intra-abdominal fat.

Musculoskeletal: Lumbar spondylosis. No aggressive lytic or
sclerotic bone lesions.
IMPRESSION: 1. Interval marked distension of the gastric lumen to the level of
the gastric antrum. Etiology indeterminate. No discrete mass
identified.
2. Interval increase in size of neck of pancreas mass. Partial
involvement of the superior mesenteric artery and celiac artery.
3. Unchanged low-attenuation structure within left lobe of liver.
